# Patient Record
Sex: Female | Born: 1965 | Race: White | Hispanic: No | Marital: Single | Smoking: Former smoker
Health system: Southern US, Community
[De-identification: ages and names within clinical notes are randomized; demographics above are authoritative.]

## PROBLEM LIST (undated history)

## (undated) ENCOUNTER — Ambulatory Visit (HOSPITAL_COMMUNITY): Admission: EM | Discharge status: Absent without leave | Payer: Medicaid Other

## (undated) DIAGNOSIS — N189 Chronic kidney disease, unspecified: Secondary | ICD-10-CM

## (undated) DIAGNOSIS — R0902 Hypoxemia: Secondary | ICD-10-CM

## (undated) DIAGNOSIS — M109 Gout, unspecified: Secondary | ICD-10-CM

## (undated) DIAGNOSIS — A4902 Methicillin resistant Staphylococcus aureus infection, unspecified site: Secondary | ICD-10-CM

## (undated) DIAGNOSIS — Z5900 Homelessness unspecified: Secondary | ICD-10-CM

## (undated) DIAGNOSIS — J45909 Unspecified asthma, uncomplicated: Secondary | ICD-10-CM

## (undated) DIAGNOSIS — N289 Disorder of kidney and ureter, unspecified: Secondary | ICD-10-CM

## (undated) DIAGNOSIS — G4733 Obstructive sleep apnea (adult) (pediatric): Secondary | ICD-10-CM

## (undated) DIAGNOSIS — L732 Hidradenitis suppurativa: Secondary | ICD-10-CM

## (undated) DIAGNOSIS — K219 Gastro-esophageal reflux disease without esophagitis: Secondary | ICD-10-CM

## (undated) DIAGNOSIS — E785 Hyperlipidemia, unspecified: Secondary | ICD-10-CM

## (undated) DIAGNOSIS — G4730 Sleep apnea, unspecified: Secondary | ICD-10-CM

## (undated) DIAGNOSIS — F419 Anxiety disorder, unspecified: Secondary | ICD-10-CM

## (undated) DIAGNOSIS — T7840XA Allergy, unspecified, initial encounter: Secondary | ICD-10-CM

## (undated) DIAGNOSIS — G8929 Other chronic pain: Secondary | ICD-10-CM

## (undated) DIAGNOSIS — R109 Unspecified abdominal pain: Secondary | ICD-10-CM

## (undated) DIAGNOSIS — M545 Low back pain, unspecified: Secondary | ICD-10-CM

## (undated) DIAGNOSIS — I10 Essential (primary) hypertension: Secondary | ICD-10-CM

## (undated) DIAGNOSIS — I509 Heart failure, unspecified: Secondary | ICD-10-CM

## (undated) DIAGNOSIS — M1990 Unspecified osteoarthritis, unspecified site: Secondary | ICD-10-CM

## (undated) DIAGNOSIS — Z9981 Dependence on supplemental oxygen: Secondary | ICD-10-CM

## (undated) DIAGNOSIS — Q828 Other specified congenital malformations of skin: Secondary | ICD-10-CM

## (undated) HISTORY — PX: FRACTURE SURGERY: SHX138

## (undated) HISTORY — DX: Low back pain: M54.5

## (undated) HISTORY — DX: Essential (primary) hypertension: I10

## (undated) HISTORY — DX: Homelessness: Z59.0

## (undated) HISTORY — DX: Gastro-esophageal reflux disease without esophagitis: K21.9

## (undated) HISTORY — PX: TUBAL LIGATION: SHX77

## (undated) HISTORY — DX: Unspecified asthma, uncomplicated: J45.909

## (undated) HISTORY — DX: Low back pain, unspecified: M54.50

## (undated) HISTORY — DX: Allergy, unspecified, initial encounter: T78.40XA

## (undated) HISTORY — PX: BACK SURGERY: SHX140

## (undated) HISTORY — DX: Homelessness unspecified: Z59.00

## (undated) HISTORY — DX: Anxiety disorder, unspecified: F41.9

## (undated) HISTORY — DX: Sleep apnea, unspecified: G47.30

## (undated) HISTORY — DX: Hypoxemia: R09.02

## (undated) HISTORY — DX: Chronic kidney disease, unspecified: N18.9

## (undated) HISTORY — DX: Other specified congenital malformations of skin: Q82.8

---

## 2000-10-23 ENCOUNTER — Emergency Department (HOSPITAL_COMMUNITY): Admission: EM | Admit: 2000-10-23 | Discharge: 2000-10-23 | Payer: Self-pay | Admitting: Emergency Medicine

## 2000-10-25 ENCOUNTER — Emergency Department (HOSPITAL_COMMUNITY): Admission: EM | Admit: 2000-10-25 | Discharge: 2000-10-25 | Payer: Self-pay | Admitting: *Deleted

## 2000-11-22 ENCOUNTER — Emergency Department (HOSPITAL_COMMUNITY): Admission: EM | Admit: 2000-11-22 | Discharge: 2000-11-22 | Payer: Self-pay | Admitting: *Deleted

## 2001-03-25 ENCOUNTER — Emergency Department (HOSPITAL_COMMUNITY): Admission: EM | Admit: 2001-03-25 | Discharge: 2001-03-25 | Payer: Self-pay | Admitting: Emergency Medicine

## 2001-04-12 ENCOUNTER — Emergency Department (HOSPITAL_COMMUNITY): Admission: EM | Admit: 2001-04-12 | Discharge: 2001-04-13 | Payer: Self-pay | Admitting: Emergency Medicine

## 2001-04-21 ENCOUNTER — Emergency Department (HOSPITAL_COMMUNITY): Admission: EM | Admit: 2001-04-21 | Discharge: 2001-04-21 | Payer: Self-pay

## 2001-04-22 ENCOUNTER — Encounter: Payer: Self-pay | Admitting: Emergency Medicine

## 2001-06-12 ENCOUNTER — Emergency Department (HOSPITAL_COMMUNITY): Admission: EM | Admit: 2001-06-12 | Discharge: 2001-06-13 | Payer: Self-pay | Admitting: Emergency Medicine

## 2001-07-12 ENCOUNTER — Encounter: Payer: Self-pay | Admitting: *Deleted

## 2001-07-12 ENCOUNTER — Emergency Department (HOSPITAL_COMMUNITY): Admission: EM | Admit: 2001-07-12 | Discharge: 2001-07-12 | Payer: Self-pay | Admitting: *Deleted

## 2001-07-21 ENCOUNTER — Emergency Department (HOSPITAL_COMMUNITY): Admission: EM | Admit: 2001-07-21 | Discharge: 2001-07-21 | Payer: Self-pay | Admitting: Emergency Medicine

## 2001-08-31 ENCOUNTER — Emergency Department (HOSPITAL_COMMUNITY): Admission: EM | Admit: 2001-08-31 | Discharge: 2001-08-31 | Payer: Self-pay | Admitting: Emergency Medicine

## 2001-09-09 ENCOUNTER — Emergency Department (HOSPITAL_COMMUNITY): Admission: EM | Admit: 2001-09-09 | Discharge: 2001-09-09 | Payer: Self-pay | Admitting: Emergency Medicine

## 2001-09-09 ENCOUNTER — Encounter: Payer: Self-pay | Admitting: Emergency Medicine

## 2002-06-07 ENCOUNTER — Emergency Department (HOSPITAL_COMMUNITY): Admission: EM | Admit: 2002-06-07 | Discharge: 2002-06-07 | Payer: Self-pay | Admitting: Emergency Medicine

## 2002-06-08 ENCOUNTER — Encounter: Payer: Self-pay | Admitting: Emergency Medicine

## 2002-06-08 ENCOUNTER — Emergency Department (HOSPITAL_COMMUNITY): Admission: EM | Admit: 2002-06-08 | Discharge: 2002-06-08 | Payer: Self-pay | Admitting: Emergency Medicine

## 2002-08-17 ENCOUNTER — Emergency Department (HOSPITAL_COMMUNITY): Admission: EM | Admit: 2002-08-17 | Discharge: 2002-08-17 | Payer: Self-pay

## 2002-08-28 ENCOUNTER — Emergency Department (HOSPITAL_COMMUNITY): Admission: EM | Admit: 2002-08-28 | Discharge: 2002-08-28 | Payer: Self-pay | Admitting: Emergency Medicine

## 2002-08-28 ENCOUNTER — Encounter: Payer: Self-pay | Admitting: Emergency Medicine

## 2003-01-20 ENCOUNTER — Inpatient Hospital Stay (HOSPITAL_COMMUNITY): Admission: EM | Admit: 2003-01-20 | Discharge: 2003-01-24 | Payer: Self-pay | Admitting: Emergency Medicine

## 2003-01-31 ENCOUNTER — Emergency Department (HOSPITAL_COMMUNITY): Admission: EM | Admit: 2003-01-31 | Discharge: 2003-01-31 | Payer: Self-pay | Admitting: Emergency Medicine

## 2003-01-31 ENCOUNTER — Emergency Department (HOSPITAL_COMMUNITY): Admission: EM | Admit: 2003-01-31 | Discharge: 2003-02-01 | Payer: Self-pay | Admitting: Emergency Medicine

## 2003-02-11 ENCOUNTER — Emergency Department (HOSPITAL_COMMUNITY): Admission: EM | Admit: 2003-02-11 | Discharge: 2003-02-11 | Payer: Self-pay | Admitting: Family Medicine

## 2003-02-11 ENCOUNTER — Emergency Department (HOSPITAL_COMMUNITY): Admission: EM | Admit: 2003-02-11 | Discharge: 2003-02-12 | Payer: Self-pay | Admitting: Emergency Medicine

## 2003-02-26 ENCOUNTER — Emergency Department (HOSPITAL_COMMUNITY): Admission: EM | Admit: 2003-02-26 | Discharge: 2003-02-26 | Payer: Self-pay | Admitting: Emergency Medicine

## 2003-03-29 ENCOUNTER — Emergency Department (HOSPITAL_COMMUNITY): Admission: EM | Admit: 2003-03-29 | Discharge: 2003-03-29 | Payer: Self-pay | Admitting: Emergency Medicine

## 2003-04-06 ENCOUNTER — Emergency Department (HOSPITAL_COMMUNITY): Admission: EM | Admit: 2003-04-06 | Discharge: 2003-04-06 | Payer: Self-pay | Admitting: Emergency Medicine

## 2003-06-03 ENCOUNTER — Emergency Department (HOSPITAL_COMMUNITY): Admission: EM | Admit: 2003-06-03 | Discharge: 2003-06-03 | Payer: Self-pay | Admitting: Emergency Medicine

## 2003-06-09 ENCOUNTER — Emergency Department (HOSPITAL_COMMUNITY): Admission: EM | Admit: 2003-06-09 | Discharge: 2003-06-10 | Payer: Self-pay | Admitting: Emergency Medicine

## 2003-06-11 ENCOUNTER — Emergency Department (HOSPITAL_COMMUNITY): Admission: EM | Admit: 2003-06-11 | Discharge: 2003-06-11 | Payer: Self-pay | Admitting: *Deleted

## 2003-06-16 ENCOUNTER — Emergency Department (HOSPITAL_COMMUNITY): Admission: EM | Admit: 2003-06-16 | Discharge: 2003-06-16 | Payer: Self-pay | Admitting: Emergency Medicine

## 2003-06-24 ENCOUNTER — Emergency Department (HOSPITAL_COMMUNITY): Admission: EM | Admit: 2003-06-24 | Discharge: 2003-06-24 | Payer: Self-pay | Admitting: Emergency Medicine

## 2003-07-14 ENCOUNTER — Emergency Department (HOSPITAL_COMMUNITY): Admission: EM | Admit: 2003-07-14 | Discharge: 2003-07-14 | Payer: Self-pay | Admitting: Emergency Medicine

## 2003-08-03 ENCOUNTER — Emergency Department (HOSPITAL_COMMUNITY): Admission: EM | Admit: 2003-08-03 | Discharge: 2003-08-04 | Payer: Self-pay | Admitting: Emergency Medicine

## 2003-08-05 ENCOUNTER — Emergency Department (HOSPITAL_COMMUNITY): Admission: EM | Admit: 2003-08-05 | Discharge: 2003-08-06 | Payer: Self-pay | Admitting: Emergency Medicine

## 2003-08-30 ENCOUNTER — Emergency Department (HOSPITAL_COMMUNITY): Admission: EM | Admit: 2003-08-30 | Discharge: 2003-08-30 | Payer: Self-pay | Admitting: Family Medicine

## 2003-09-11 ENCOUNTER — Emergency Department (HOSPITAL_COMMUNITY): Admission: EM | Admit: 2003-09-11 | Discharge: 2003-09-11 | Payer: Self-pay | Admitting: Emergency Medicine

## 2003-09-17 ENCOUNTER — Emergency Department (HOSPITAL_COMMUNITY): Admission: EM | Admit: 2003-09-17 | Discharge: 2003-09-17 | Payer: Self-pay | Admitting: Emergency Medicine

## 2003-12-18 ENCOUNTER — Emergency Department (HOSPITAL_COMMUNITY): Admission: EM | Admit: 2003-12-18 | Discharge: 2003-12-18 | Payer: Self-pay | Admitting: Family Medicine

## 2004-04-09 ENCOUNTER — Ambulatory Visit (HOSPITAL_COMMUNITY): Admission: RE | Admit: 2004-04-09 | Discharge: 2004-04-09 | Payer: Self-pay | Admitting: Internal Medicine

## 2004-04-09 ENCOUNTER — Ambulatory Visit: Payer: Self-pay | Admitting: Internal Medicine

## 2004-05-01 ENCOUNTER — Emergency Department (HOSPITAL_COMMUNITY): Admission: EM | Admit: 2004-05-01 | Discharge: 2004-05-01 | Payer: Self-pay | Admitting: Emergency Medicine

## 2004-08-05 ENCOUNTER — Ambulatory Visit (HOSPITAL_COMMUNITY): Admission: RE | Admit: 2004-08-05 | Discharge: 2004-08-05 | Payer: Self-pay | Admitting: Internal Medicine

## 2004-08-05 ENCOUNTER — Ambulatory Visit: Payer: Self-pay | Admitting: Internal Medicine

## 2004-11-29 ENCOUNTER — Emergency Department (HOSPITAL_COMMUNITY): Admission: EM | Admit: 2004-11-29 | Discharge: 2004-11-29 | Payer: Self-pay | Admitting: Emergency Medicine

## 2004-12-03 ENCOUNTER — Emergency Department (HOSPITAL_COMMUNITY): Admission: EM | Admit: 2004-12-03 | Discharge: 2004-12-04 | Payer: Self-pay | Admitting: Emergency Medicine

## 2005-01-03 ENCOUNTER — Emergency Department (HOSPITAL_COMMUNITY): Admission: EM | Admit: 2005-01-03 | Discharge: 2005-01-03 | Payer: Self-pay | Admitting: Emergency Medicine

## 2005-03-12 ENCOUNTER — Emergency Department (HOSPITAL_COMMUNITY): Admission: EM | Admit: 2005-03-12 | Discharge: 2005-03-13 | Payer: Self-pay | Admitting: Emergency Medicine

## 2005-05-23 ENCOUNTER — Emergency Department (HOSPITAL_COMMUNITY): Admission: EM | Admit: 2005-05-23 | Discharge: 2005-05-24 | Payer: Self-pay | Admitting: Emergency Medicine

## 2005-06-03 ENCOUNTER — Emergency Department (HOSPITAL_COMMUNITY): Admission: EM | Admit: 2005-06-03 | Discharge: 2005-06-03 | Payer: Self-pay | Admitting: Emergency Medicine

## 2005-07-09 ENCOUNTER — Ambulatory Visit: Payer: Self-pay | Admitting: Internal Medicine

## 2005-07-24 ENCOUNTER — Ambulatory Visit: Payer: Self-pay | Admitting: Internal Medicine

## 2005-08-31 IMAGING — CT CT HEAD W/O CM
1 of 2 series · 13 of 30 positions shown, 17 images · non-contrast
Comparison: none

CLINICAL DATA: Severe headache.

 CT HEAD WITHOUT CONTRAST ? 02/12/03
TECHNIQUE: Multiple contiguous axial images were obtained from the skull base to the vertex.

[Series 3: — · axial · 0.46mm/px · z∈[-186,-56]mm · 13 of 32 slices shown, 17 images]
[im 3/32  brain]
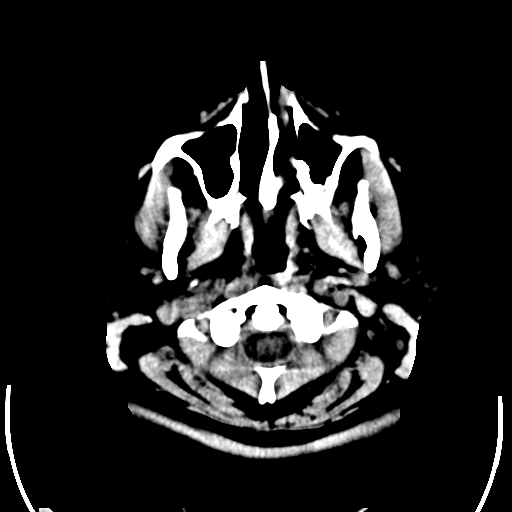
[im 3/32  bone]
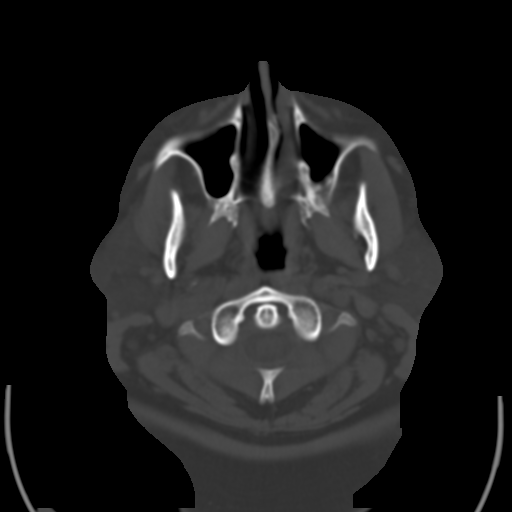
[im 5/32  brain]
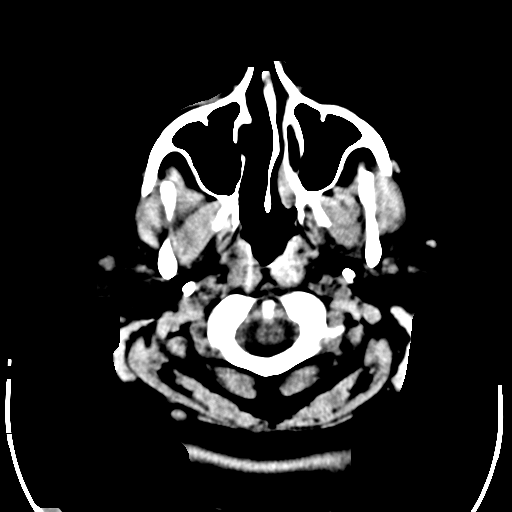
[im 7/32  brain]
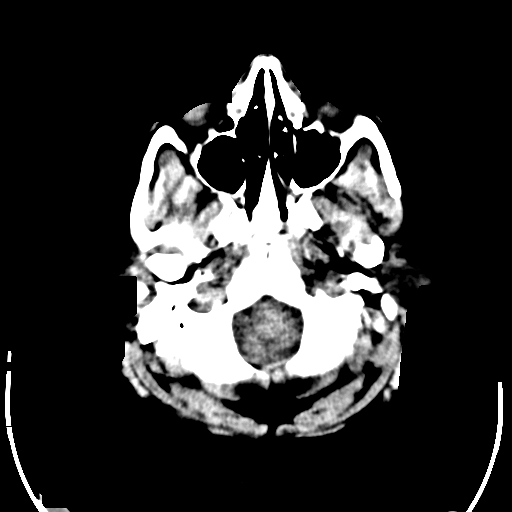
[im 9/32  brain]
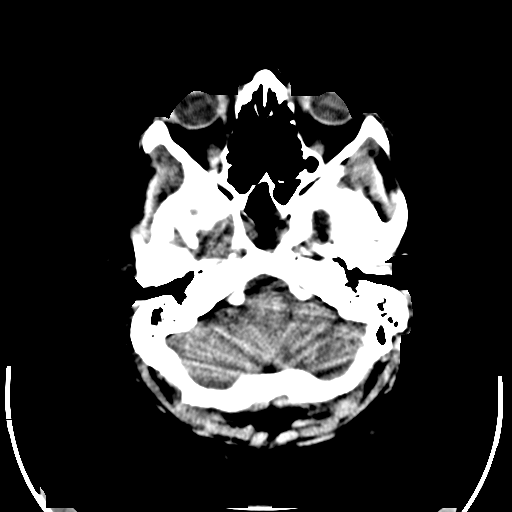
[im 12/32  brain]
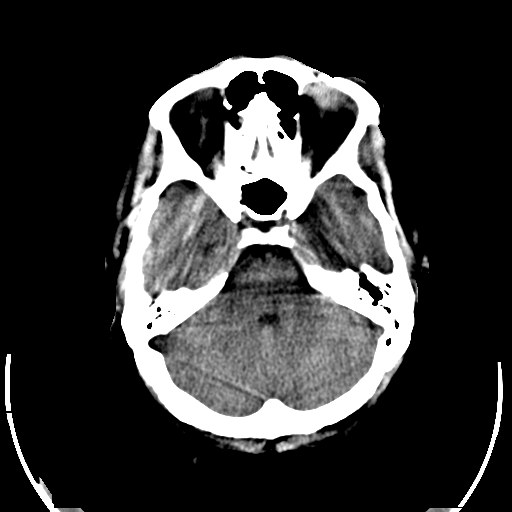
[im 12/32  bone]
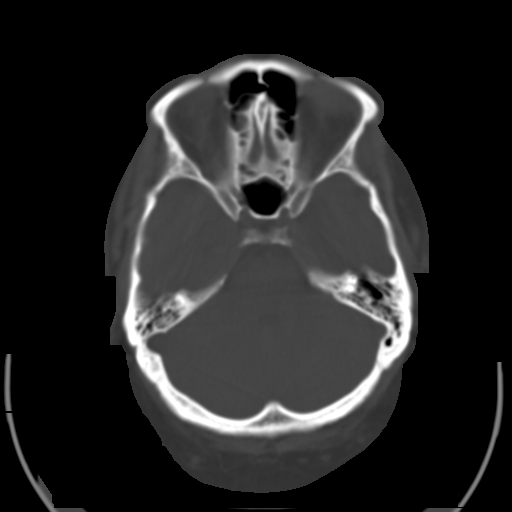
[im 14/32  brain]
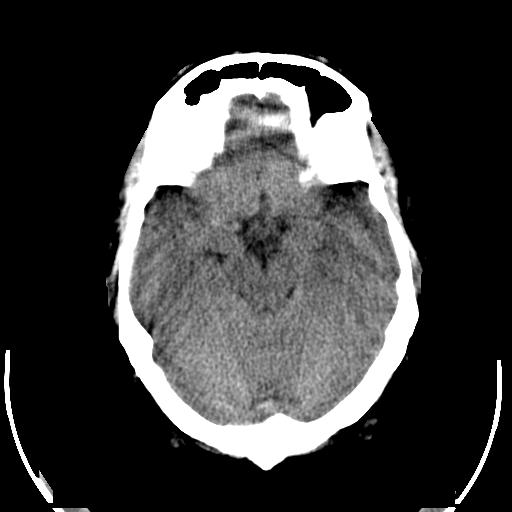
[im 16/32  brain]
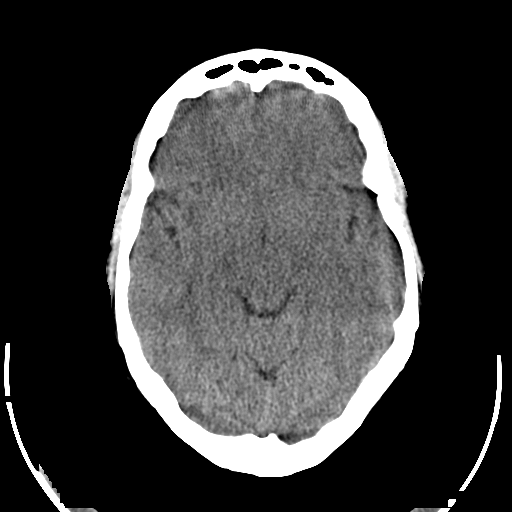
[im 18/32  brain]
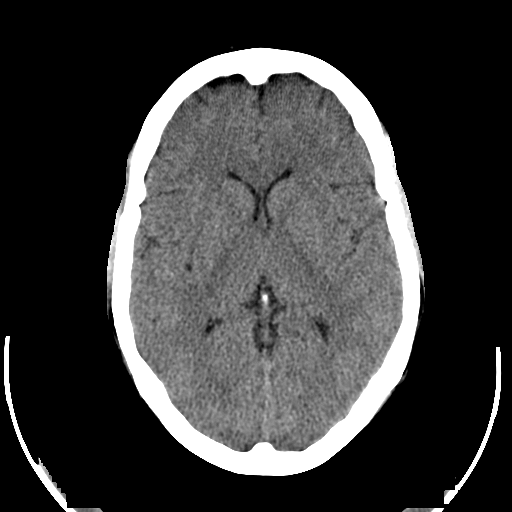
[im 20/32  brain]
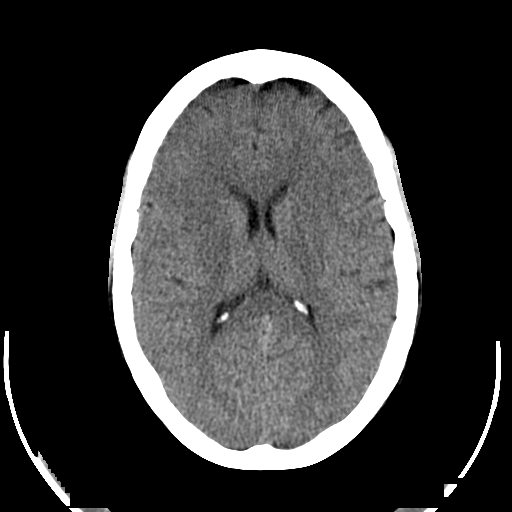
[im 20/32  bone]
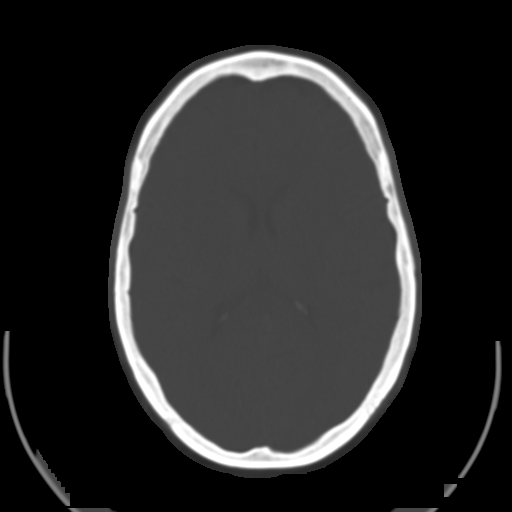
[im 23/32  brain]
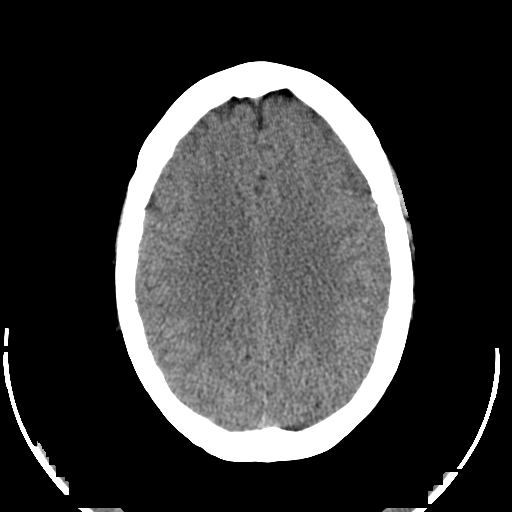
[im 25/32  brain]
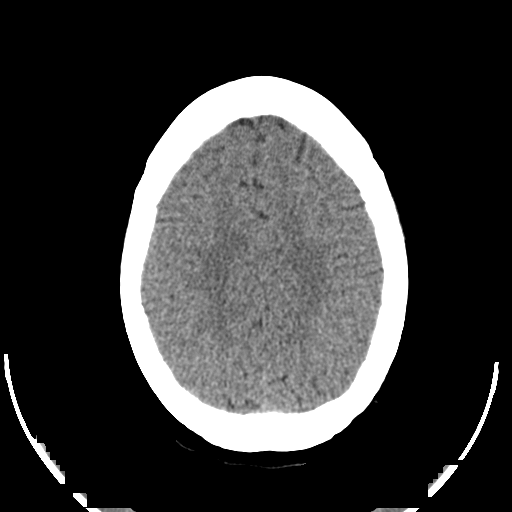
[im 27/32  brain]
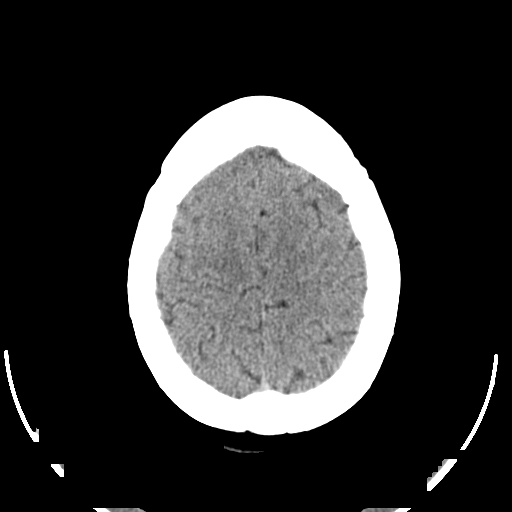
[im 29/32  brain]
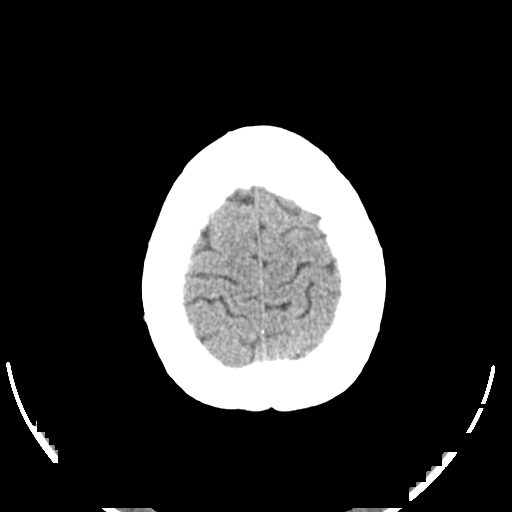
[im 29/32  bone]
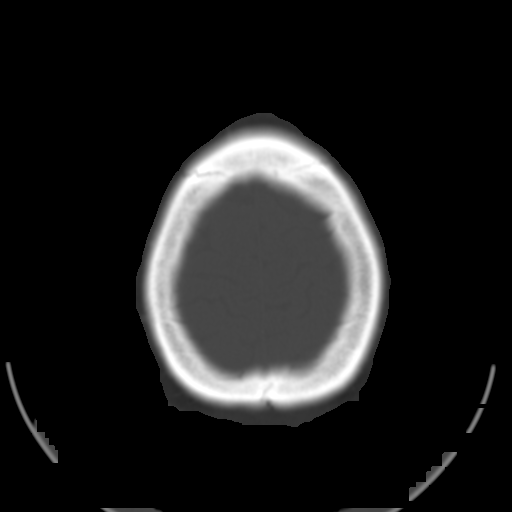

[13 of 30 positions shown; findings below may reference images not displayed]

FINDINGS: No evidence of acute intracranial abnormality including mass or mass effect, hydrocephalus, extra-axial fluid collection, midline shift, hemorrhage, or infarct.   Acute infarct may be missed by CT for 24-48 hours.   The visualized bony calvarium and paranasal sinuses are unremarkable except for mucosal thickening in the sphenoid sinus.

 IMPRESSION
 1.  No evidence of acute intracranial abnormality.  
 2.  Chronic sphenoid sinusitis.

## 2005-09-11 ENCOUNTER — Emergency Department (HOSPITAL_COMMUNITY): Admission: EM | Admit: 2005-09-11 | Discharge: 2005-09-12 | Payer: Self-pay | Admitting: Emergency Medicine

## 2005-12-15 ENCOUNTER — Emergency Department (HOSPITAL_COMMUNITY): Admission: EM | Admit: 2005-12-15 | Discharge: 2005-12-15 | Payer: Self-pay | Admitting: Emergency Medicine

## 2005-12-22 ENCOUNTER — Emergency Department (HOSPITAL_COMMUNITY): Admission: EM | Admit: 2005-12-22 | Discharge: 2005-12-22 | Payer: Self-pay | Admitting: Emergency Medicine

## 2005-12-30 ENCOUNTER — Emergency Department (HOSPITAL_COMMUNITY): Admission: EM | Admit: 2005-12-30 | Discharge: 2005-12-30 | Payer: Self-pay | Admitting: Emergency Medicine

## 2005-12-31 ENCOUNTER — Emergency Department (HOSPITAL_COMMUNITY): Admission: EM | Admit: 2005-12-31 | Discharge: 2005-12-31 | Payer: Self-pay | Admitting: Emergency Medicine

## 2006-01-02 IMAGING — CR DG CHEST 2V
2 series · 2 of 2 positions shown · non-contrast
Comparison: 04/22/01.

CLINICAL DATA: Chest pain and cough.  Lower extremity swelling.
 CHEST, TWO VIEWS

[view not recorded (1 of 2)]
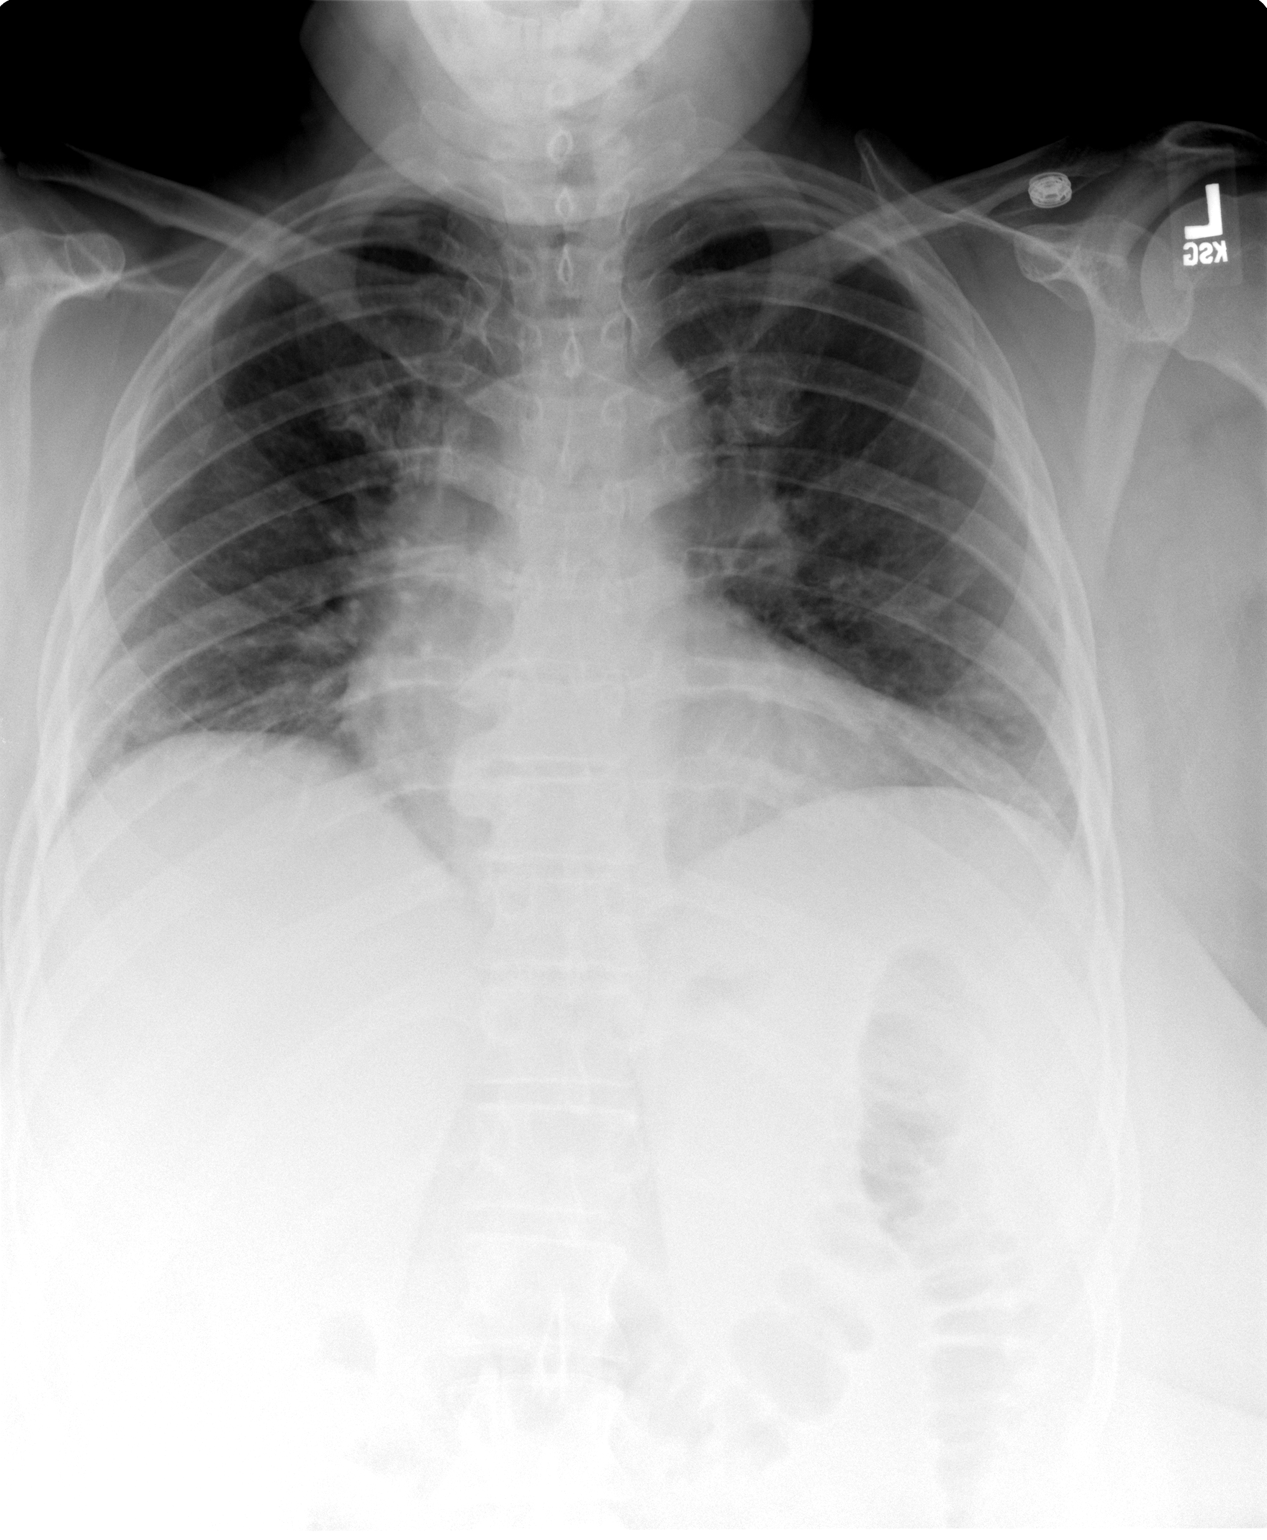

[view not recorded (2 of 2)]
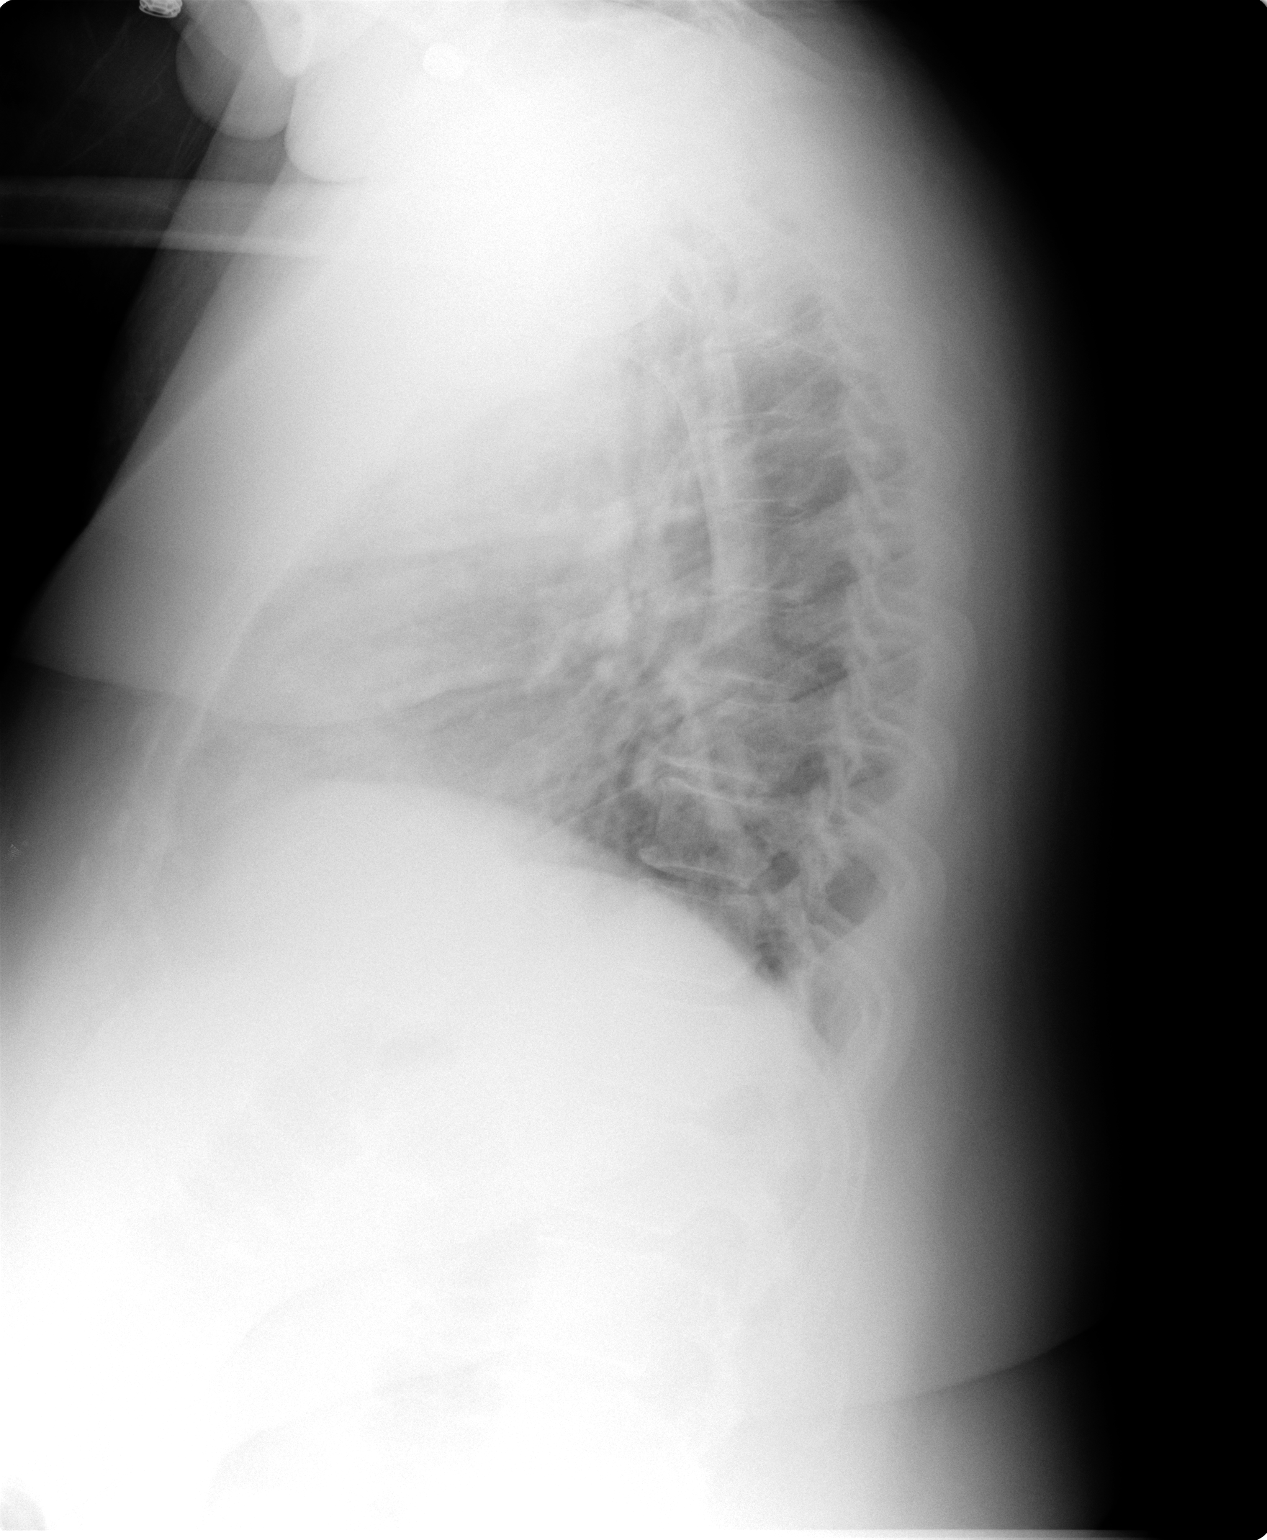

[2 of 2 positions shown; findings below may reference images not displayed]

Low inspiratory lung volumes are again seen.  Pulmonary interstitial prominence is unchanged.  There is no evidence of acute infiltrate or pleural effusion.  Heart size is prominent, and this may be due to low lung volumes; however, this is also unchanged compared to the prior study. 
 IMPRESSION
 Low inspiratory lung volumes.  No acute disease.

## 2006-01-07 ENCOUNTER — Emergency Department (HOSPITAL_COMMUNITY): Admission: EM | Admit: 2006-01-07 | Discharge: 2006-01-07 | Payer: Self-pay | Admitting: Emergency Medicine

## 2006-01-20 ENCOUNTER — Emergency Department (HOSPITAL_COMMUNITY): Admission: EM | Admit: 2006-01-20 | Discharge: 2006-01-21 | Payer: Self-pay | Admitting: Emergency Medicine

## 2006-01-21 ENCOUNTER — Encounter (INDEPENDENT_AMBULATORY_CARE_PROVIDER_SITE_OTHER): Payer: Self-pay | Admitting: Specialist

## 2006-01-21 ENCOUNTER — Ambulatory Visit: Payer: Self-pay | Admitting: Internal Medicine

## 2006-01-22 ENCOUNTER — Other Ambulatory Visit: Admission: RE | Admit: 2006-01-22 | Discharge: 2006-01-22 | Payer: Self-pay | Admitting: Unknown Physician Specialty

## 2006-01-23 ENCOUNTER — Emergency Department (HOSPITAL_COMMUNITY): Admission: EM | Admit: 2006-01-23 | Discharge: 2006-01-23 | Payer: Self-pay | Admitting: Emergency Medicine

## 2006-01-24 ENCOUNTER — Emergency Department (HOSPITAL_COMMUNITY): Admission: EM | Admit: 2006-01-24 | Discharge: 2006-01-24 | Payer: Self-pay | Admitting: Emergency Medicine

## 2006-01-30 ENCOUNTER — Ambulatory Visit: Payer: Self-pay | Admitting: Internal Medicine

## 2006-02-09 DIAGNOSIS — I10 Essential (primary) hypertension: Secondary | ICD-10-CM | POA: Insufficient documentation

## 2006-02-09 DIAGNOSIS — Z9189 Other specified personal risk factors, not elsewhere classified: Secondary | ICD-10-CM | POA: Insufficient documentation

## 2006-02-09 DIAGNOSIS — J45909 Unspecified asthma, uncomplicated: Secondary | ICD-10-CM | POA: Insufficient documentation

## 2006-02-16 ENCOUNTER — Emergency Department (HOSPITAL_COMMUNITY): Admission: EM | Admit: 2006-02-16 | Discharge: 2006-02-17 | Payer: Self-pay | Admitting: Emergency Medicine

## 2006-02-17 ENCOUNTER — Encounter: Payer: Self-pay | Admitting: Vascular Surgery

## 2006-02-22 IMAGING — CR DG CHEST 2V
2 series · 2 of 2 positions shown · non-contrast
Comparison: none

CLINICAL DATA: Chest pain.  Congestion.  Productive cough.  Fever.  Asthma.
 TWO VIEWS OF THE CHEST
 Comparison, 06/24/2003.
 Low lung volumes.  Borderline cardiac enlargement.  Mediastinal contours and vascularity are normal.  Bibasilar atelectasis, similar to previous exam.  No definite acute consolidation or effusion.  Bones unremarkable.
 IMPRESSION
 Low lung volumes with bibasilar atelectasis.  No interval change.

[view not recorded (1 of 2)]
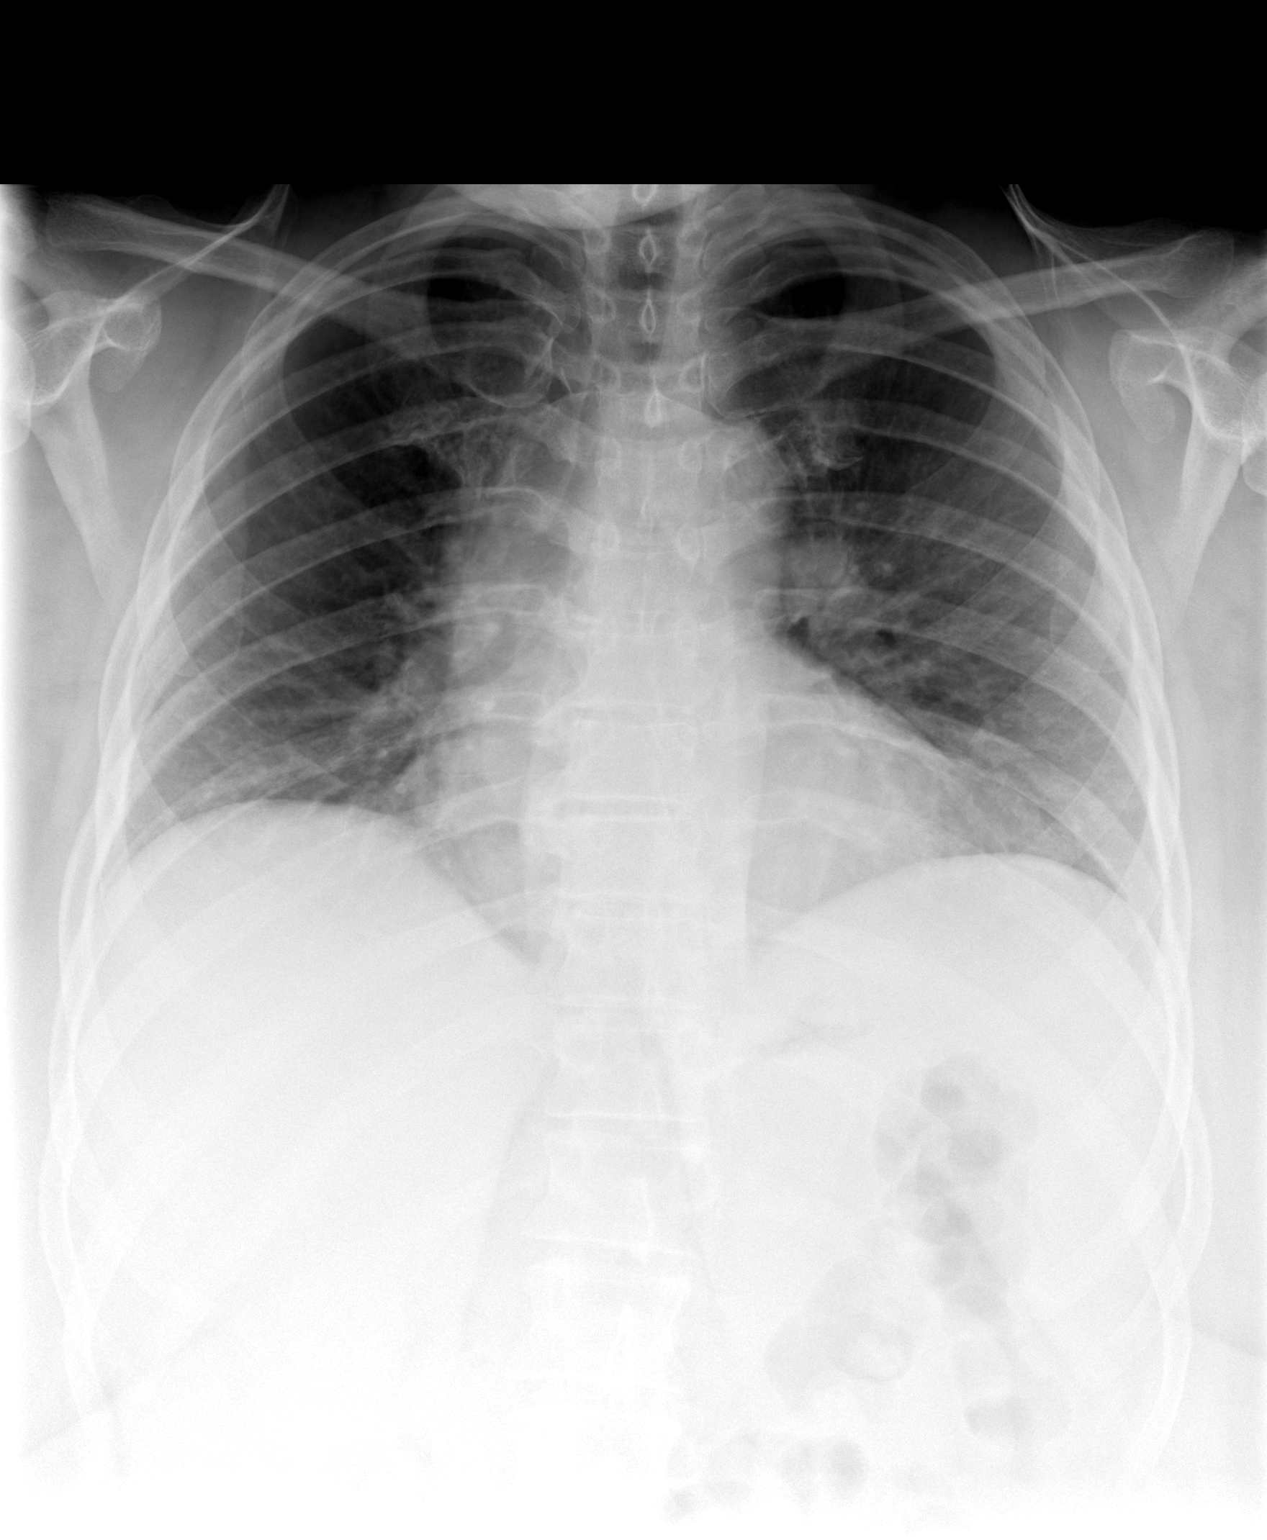

[view not recorded (2 of 2)]
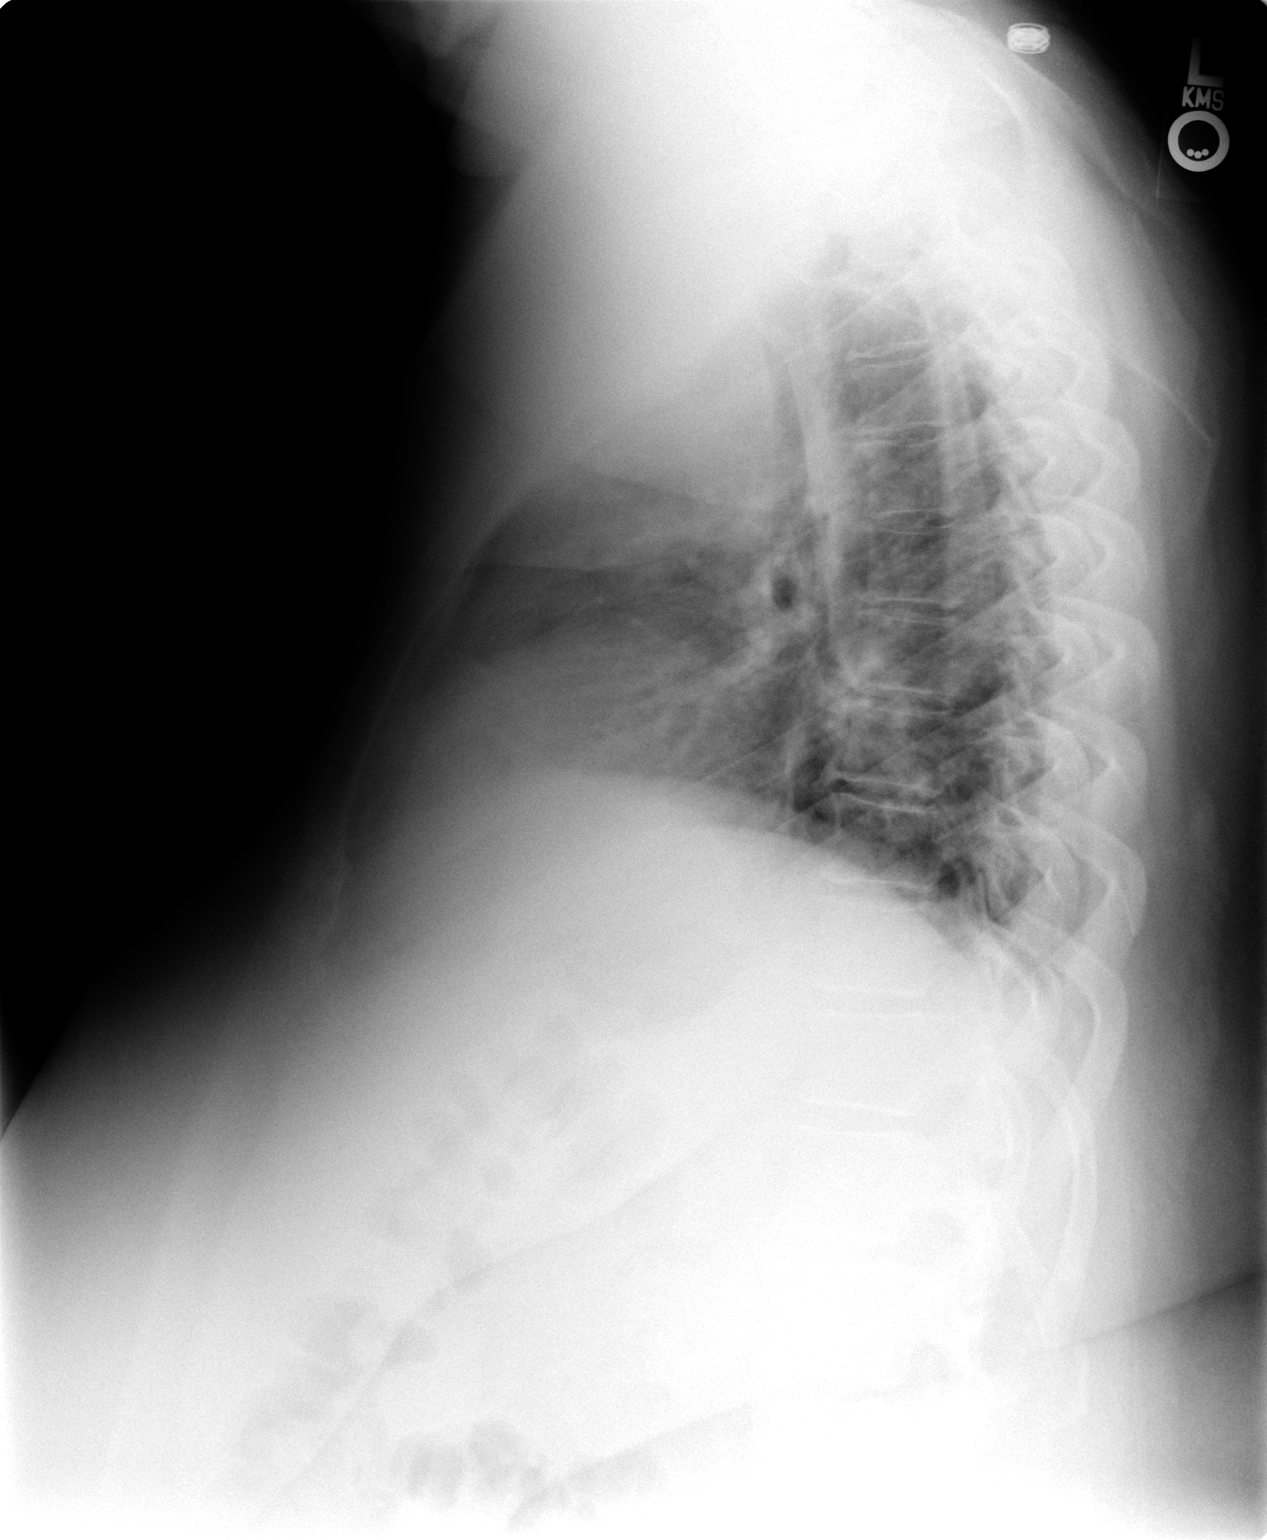

[2 of 2 positions shown; findings below may reference images not displayed]

## 2006-02-24 ENCOUNTER — Emergency Department (HOSPITAL_COMMUNITY): Admission: EM | Admit: 2006-02-24 | Discharge: 2006-02-24 | Payer: Self-pay | Admitting: Emergency Medicine

## 2006-02-26 ENCOUNTER — Emergency Department (HOSPITAL_COMMUNITY): Admission: EM | Admit: 2006-02-26 | Discharge: 2006-02-26 | Payer: Self-pay | Admitting: Emergency Medicine

## 2006-02-27 ENCOUNTER — Ambulatory Visit: Payer: Self-pay | Admitting: Internal Medicine

## 2006-03-03 ENCOUNTER — Emergency Department (HOSPITAL_COMMUNITY): Admission: EM | Admit: 2006-03-03 | Discharge: 2006-03-04 | Payer: Self-pay | Admitting: Emergency Medicine

## 2006-03-05 ENCOUNTER — Emergency Department (HOSPITAL_COMMUNITY): Admission: EM | Admit: 2006-03-05 | Discharge: 2006-03-06 | Payer: Self-pay | Admitting: Emergency Medicine

## 2006-03-20 ENCOUNTER — Emergency Department (HOSPITAL_COMMUNITY): Admission: EM | Admit: 2006-03-20 | Discharge: 2006-03-20 | Payer: Self-pay | Admitting: Emergency Medicine

## 2006-03-30 ENCOUNTER — Emergency Department (HOSPITAL_COMMUNITY): Admission: EM | Admit: 2006-03-30 | Discharge: 2006-03-30 | Payer: Self-pay | Admitting: Emergency Medicine

## 2006-04-15 ENCOUNTER — Encounter: Payer: Self-pay | Admitting: Licensed Clinical Social Worker

## 2006-04-21 ENCOUNTER — Encounter (INDEPENDENT_AMBULATORY_CARE_PROVIDER_SITE_OTHER): Payer: Self-pay | Admitting: Internal Medicine

## 2006-04-21 ENCOUNTER — Ambulatory Visit: Payer: Self-pay | Admitting: Internal Medicine

## 2006-04-21 DIAGNOSIS — L259 Unspecified contact dermatitis, unspecified cause: Secondary | ICD-10-CM

## 2006-04-21 LAB — CONVERTED CEMR LAB
BUN: 20 mg/dL (ref 6–23)
Basophils Relative: 0 % (ref 0–1)
CO2: 30 meq/L (ref 19–32)
Chloride: 100 meq/L (ref 96–112)
Hemoglobin: 12.2 g/dL (ref 12.0–15.0)
Lymphocytes Relative: 18 % (ref 12–46)
MCHC: 33 g/dL (ref 30.0–36.0)
Monocytes Absolute: 0.5 10*3/uL (ref 0.2–0.7)
Monocytes Relative: 6 % (ref 3–11)
Neutro Abs: 6.2 10*3/uL (ref 1.7–7.7)
Neutrophils Relative %: 72 % (ref 43–77)
Potassium: 4 meq/L (ref 3.5–5.3)
RBC: 4.24 M/uL (ref 3.87–5.11)
WBC: 8.7 10*3/uL (ref 4.0–10.5)

## 2006-04-27 ENCOUNTER — Emergency Department (HOSPITAL_COMMUNITY): Admission: EM | Admit: 2006-04-27 | Discharge: 2006-04-28 | Payer: Self-pay | Admitting: Emergency Medicine

## 2006-04-28 ENCOUNTER — Emergency Department (HOSPITAL_COMMUNITY): Admission: EM | Admit: 2006-04-28 | Discharge: 2006-04-28 | Payer: Self-pay | Admitting: *Deleted

## 2006-04-29 ENCOUNTER — Emergency Department (HOSPITAL_COMMUNITY): Admission: EM | Admit: 2006-04-29 | Discharge: 2006-04-30 | Payer: Self-pay | Admitting: Emergency Medicine

## 2006-05-02 ENCOUNTER — Emergency Department (HOSPITAL_COMMUNITY): Admission: EM | Admit: 2006-05-02 | Discharge: 2006-05-02 | Payer: Self-pay | Admitting: Emergency Medicine

## 2006-05-04 ENCOUNTER — Ambulatory Visit: Payer: Self-pay | Admitting: Internal Medicine

## 2006-05-04 ENCOUNTER — Inpatient Hospital Stay (HOSPITAL_COMMUNITY): Admission: EM | Admit: 2006-05-04 | Discharge: 2006-05-08 | Payer: Self-pay | Admitting: Emergency Medicine

## 2006-06-04 ENCOUNTER — Emergency Department (HOSPITAL_COMMUNITY): Admission: EM | Admit: 2006-06-04 | Discharge: 2006-06-04 | Payer: Self-pay | Admitting: Emergency Medicine

## 2006-06-07 ENCOUNTER — Emergency Department (HOSPITAL_COMMUNITY): Admission: EM | Admit: 2006-06-07 | Discharge: 2006-06-07 | Payer: Self-pay | Admitting: Emergency Medicine

## 2006-06-13 ENCOUNTER — Emergency Department (HOSPITAL_COMMUNITY): Admission: EM | Admit: 2006-06-13 | Discharge: 2006-06-14 | Payer: Self-pay | Admitting: Emergency Medicine

## 2006-06-14 ENCOUNTER — Emergency Department (HOSPITAL_COMMUNITY): Admission: EM | Admit: 2006-06-14 | Discharge: 2006-06-15 | Payer: Self-pay | Admitting: Emergency Medicine

## 2006-06-23 ENCOUNTER — Emergency Department (HOSPITAL_COMMUNITY): Admission: EM | Admit: 2006-06-23 | Discharge: 2006-06-23 | Payer: Self-pay | Admitting: Emergency Medicine

## 2006-07-06 ENCOUNTER — Ambulatory Visit: Payer: Self-pay | Admitting: Hospitalist

## 2006-07-09 ENCOUNTER — Emergency Department (HOSPITAL_COMMUNITY): Admission: EM | Admit: 2006-07-09 | Discharge: 2006-07-10 | Payer: Self-pay | Admitting: Emergency Medicine

## 2006-07-24 ENCOUNTER — Ambulatory Visit: Payer: Self-pay | Admitting: Internal Medicine

## 2006-07-24 ENCOUNTER — Ambulatory Visit (HOSPITAL_COMMUNITY): Admission: RE | Admit: 2006-07-24 | Discharge: 2006-07-24 | Payer: Self-pay | Admitting: *Deleted

## 2006-07-31 ENCOUNTER — Emergency Department (HOSPITAL_COMMUNITY): Admission: EM | Admit: 2006-07-31 | Discharge: 2006-07-31 | Payer: Self-pay | Admitting: Emergency Medicine

## 2006-08-10 ENCOUNTER — Emergency Department (HOSPITAL_COMMUNITY): Admission: EM | Admit: 2006-08-10 | Discharge: 2006-08-10 | Payer: Self-pay | Admitting: Emergency Medicine

## 2006-08-14 ENCOUNTER — Emergency Department (HOSPITAL_COMMUNITY): Admission: EM | Admit: 2006-08-14 | Discharge: 2006-08-14 | Payer: Self-pay | Admitting: Emergency Medicine

## 2006-08-21 ENCOUNTER — Emergency Department (HOSPITAL_COMMUNITY): Admission: EM | Admit: 2006-08-21 | Discharge: 2006-08-22 | Payer: Self-pay | Admitting: Emergency Medicine

## 2006-08-28 ENCOUNTER — Ambulatory Visit: Payer: Self-pay | Admitting: Internal Medicine

## 2006-09-13 ENCOUNTER — Emergency Department (HOSPITAL_COMMUNITY): Admission: EM | Admit: 2006-09-13 | Discharge: 2006-09-13 | Payer: Self-pay | Admitting: Emergency Medicine

## 2006-09-13 ENCOUNTER — Encounter (INDEPENDENT_AMBULATORY_CARE_PROVIDER_SITE_OTHER): Payer: Self-pay | Admitting: Emergency Medicine

## 2006-09-13 ENCOUNTER — Ambulatory Visit: Payer: Self-pay | Admitting: Vascular Surgery

## 2006-09-14 ENCOUNTER — Telehealth: Payer: Self-pay | Admitting: *Deleted

## 2006-09-14 ENCOUNTER — Emergency Department (HOSPITAL_COMMUNITY): Admission: EM | Admit: 2006-09-14 | Discharge: 2006-09-14 | Payer: Self-pay | Admitting: Emergency Medicine

## 2006-09-15 ENCOUNTER — Ambulatory Visit: Payer: Self-pay | Admitting: Internal Medicine

## 2006-09-15 ENCOUNTER — Emergency Department (HOSPITAL_COMMUNITY): Admission: EM | Admit: 2006-09-15 | Discharge: 2006-09-15 | Payer: Self-pay | Admitting: Emergency Medicine

## 2006-09-15 ENCOUNTER — Encounter (INDEPENDENT_AMBULATORY_CARE_PROVIDER_SITE_OTHER): Payer: Self-pay | Admitting: *Deleted

## 2006-09-19 ENCOUNTER — Emergency Department (HOSPITAL_COMMUNITY): Admission: EM | Admit: 2006-09-19 | Discharge: 2006-09-19 | Payer: Self-pay | Admitting: Emergency Medicine

## 2006-10-02 ENCOUNTER — Ambulatory Visit: Payer: Self-pay | Admitting: Internal Medicine

## 2006-10-02 ENCOUNTER — Encounter (INDEPENDENT_AMBULATORY_CARE_PROVIDER_SITE_OTHER): Payer: Self-pay | Admitting: *Deleted

## 2006-10-09 ENCOUNTER — Emergency Department (HOSPITAL_COMMUNITY): Admission: EM | Admit: 2006-10-09 | Discharge: 2006-10-09 | Payer: Self-pay | Admitting: Emergency Medicine

## 2006-10-23 ENCOUNTER — Emergency Department (HOSPITAL_COMMUNITY): Admission: EM | Admit: 2006-10-23 | Discharge: 2006-10-23 | Payer: Self-pay | Admitting: Emergency Medicine

## 2006-10-27 IMAGING — CR DG CHEST 2V
2 series · 2 of 2 positions shown · non-contrast
Comparison: 08/06/03.

CLINICAL DATA: Shortness of breath.  Chest pain.

[view not recorded (1 of 2)]
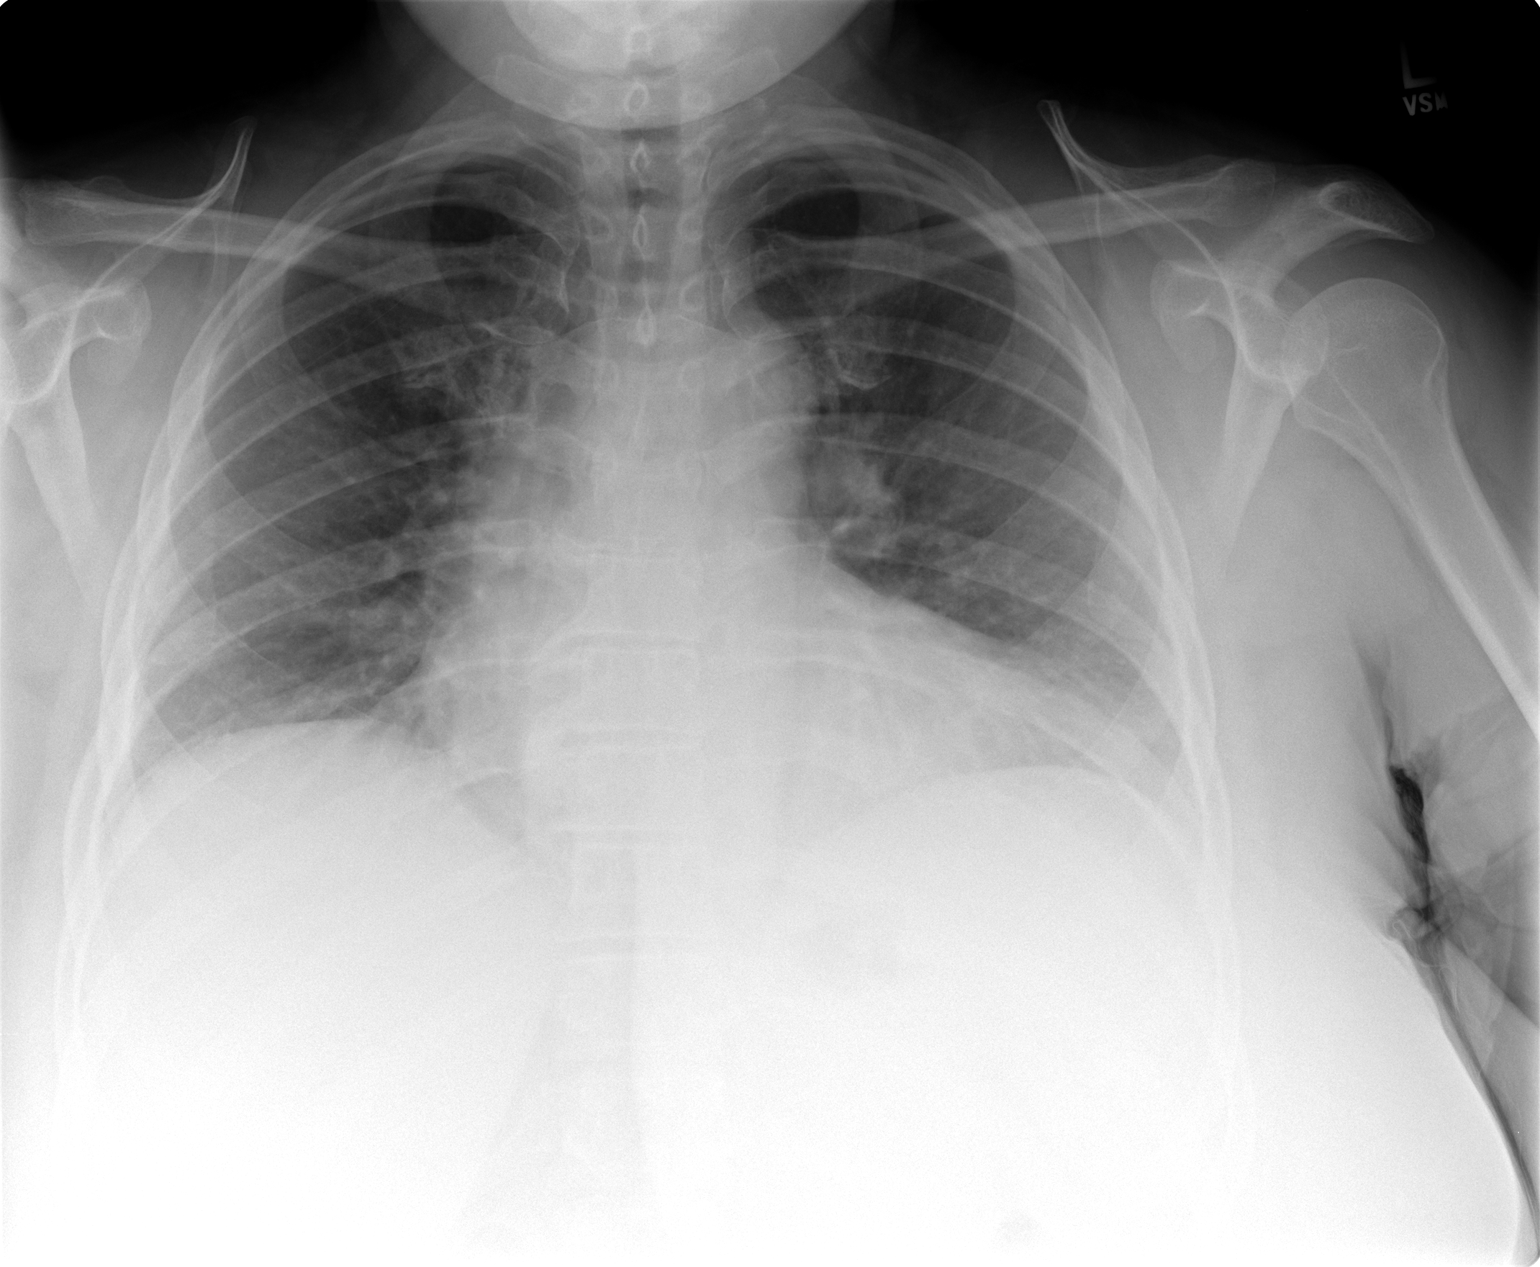

[view not recorded (2 of 2)]
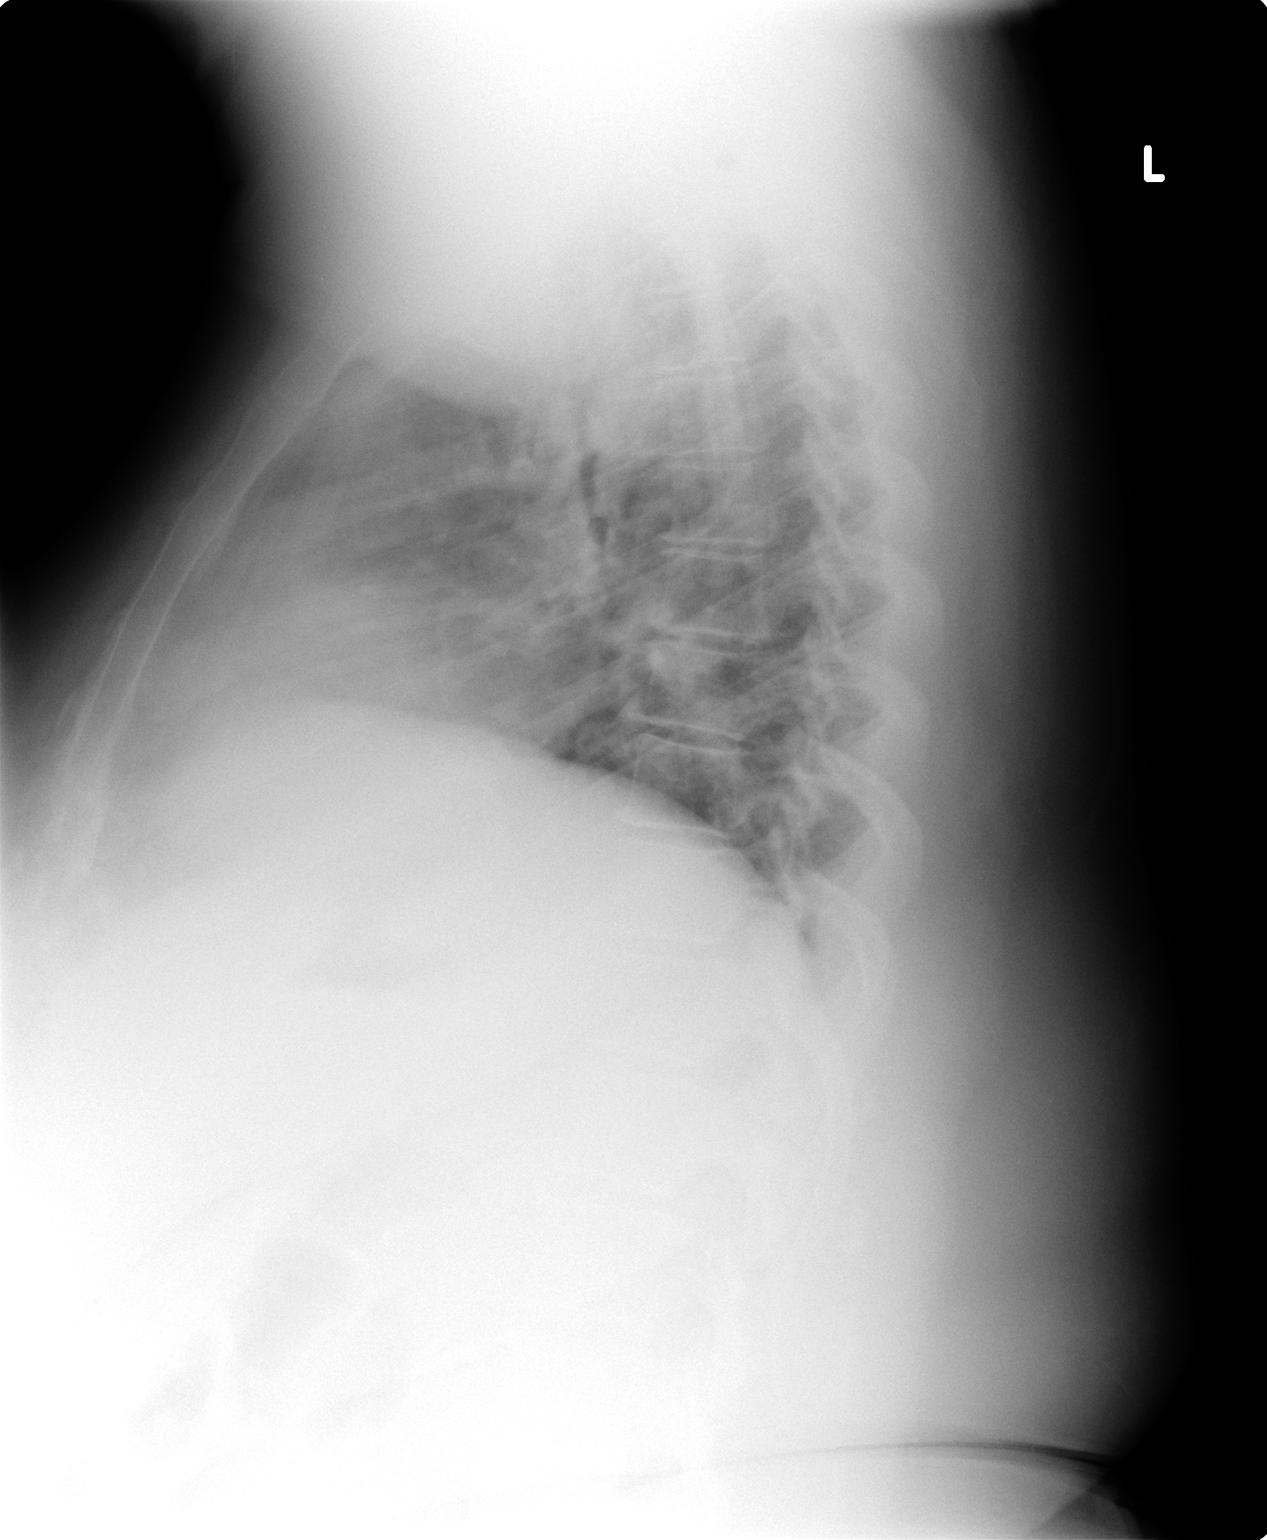

[2 of 2 positions shown; findings below may reference images not displayed]

PA AND LATERAL CHEST:
 Lung volumes are lower than on the prior study.  However, the lungs are clear.  No pleural effusion.  Heart size is normal.  No focal bony abnormality.
IMPRESSION: Low lung volumes without acute disease.

## 2006-10-30 ENCOUNTER — Telehealth: Payer: Self-pay | Admitting: *Deleted

## 2006-10-31 ENCOUNTER — Emergency Department (HOSPITAL_COMMUNITY): Admission: EM | Admit: 2006-10-31 | Discharge: 2006-10-31 | Payer: Self-pay | Admitting: Emergency Medicine

## 2006-11-03 ENCOUNTER — Emergency Department (HOSPITAL_COMMUNITY): Admission: EM | Admit: 2006-11-03 | Discharge: 2006-11-04 | Payer: Self-pay | Admitting: Emergency Medicine

## 2006-11-12 ENCOUNTER — Emergency Department (HOSPITAL_COMMUNITY): Admission: EM | Admit: 2006-11-12 | Discharge: 2006-11-12 | Payer: Self-pay | Admitting: Emergency Medicine

## 2006-11-22 ENCOUNTER — Emergency Department (HOSPITAL_COMMUNITY): Admission: EM | Admit: 2006-11-22 | Discharge: 2006-11-22 | Payer: Self-pay | Admitting: Emergency Medicine

## 2006-11-23 ENCOUNTER — Emergency Department (HOSPITAL_COMMUNITY): Admission: EM | Admit: 2006-11-23 | Discharge: 2006-11-24 | Payer: Self-pay | Admitting: Emergency Medicine

## 2006-11-24 ENCOUNTER — Encounter (INDEPENDENT_AMBULATORY_CARE_PROVIDER_SITE_OTHER): Payer: Self-pay | Admitting: Internal Medicine

## 2006-11-24 ENCOUNTER — Ambulatory Visit: Payer: Self-pay | Admitting: Internal Medicine

## 2006-11-24 DIAGNOSIS — M549 Dorsalgia, unspecified: Secondary | ICD-10-CM | POA: Insufficient documentation

## 2006-11-24 LAB — CONVERTED CEMR LAB
Hemoglobin, Urine: NEGATIVE
Leukocytes, UA: NEGATIVE
Protein, ur: NEGATIVE mg/dL
Urobilinogen, UA: 1 (ref 0.0–1.0)

## 2006-11-25 ENCOUNTER — Encounter (INDEPENDENT_AMBULATORY_CARE_PROVIDER_SITE_OTHER): Payer: Self-pay | Admitting: Internal Medicine

## 2006-11-26 ENCOUNTER — Emergency Department (HOSPITAL_COMMUNITY): Admission: EM | Admit: 2006-11-26 | Discharge: 2006-11-27 | Payer: Self-pay | Admitting: Emergency Medicine

## 2006-12-11 ENCOUNTER — Emergency Department (HOSPITAL_COMMUNITY): Admission: EM | Admit: 2006-12-11 | Discharge: 2006-12-12 | Payer: Self-pay | Admitting: Emergency Medicine

## 2007-01-14 ENCOUNTER — Emergency Department (HOSPITAL_COMMUNITY): Admission: EM | Admit: 2007-01-14 | Discharge: 2007-01-14 | Payer: Self-pay | Admitting: Emergency Medicine

## 2007-01-26 ENCOUNTER — Observation Stay (HOSPITAL_COMMUNITY): Admission: EM | Admit: 2007-01-26 | Discharge: 2007-01-27 | Payer: Self-pay | Admitting: Emergency Medicine

## 2007-01-26 ENCOUNTER — Ambulatory Visit: Payer: Self-pay | Admitting: Infectious Disease

## 2007-02-03 ENCOUNTER — Emergency Department (HOSPITAL_COMMUNITY): Admission: EM | Admit: 2007-02-03 | Discharge: 2007-02-03 | Payer: Self-pay | Admitting: *Deleted

## 2007-02-05 ENCOUNTER — Encounter (INDEPENDENT_AMBULATORY_CARE_PROVIDER_SITE_OTHER): Payer: Self-pay | Admitting: *Deleted

## 2007-02-05 ENCOUNTER — Encounter: Payer: Self-pay | Admitting: Licensed Clinical Social Worker

## 2007-02-05 ENCOUNTER — Ambulatory Visit: Payer: Self-pay | Admitting: Internal Medicine

## 2007-02-22 IMAGING — CR DG KNEE COMPLETE 4+V*L*
4 series · 4 of 4 positions shown · non-contrast
Comparison: none

CLINICAL DATA: Pain.  
 LEFT KNEE - 4 VIEWS:
 The patient has tricompartmental degenerative changes, most notably from the patellofemoral and medial compartments.  There is no joint effusion.  Negative for fracture.  Scattered densities anteriorly within the patellar tendon region noted, though this may reflect radiopaque debris.

[t knee oblique left (1 of 2)]
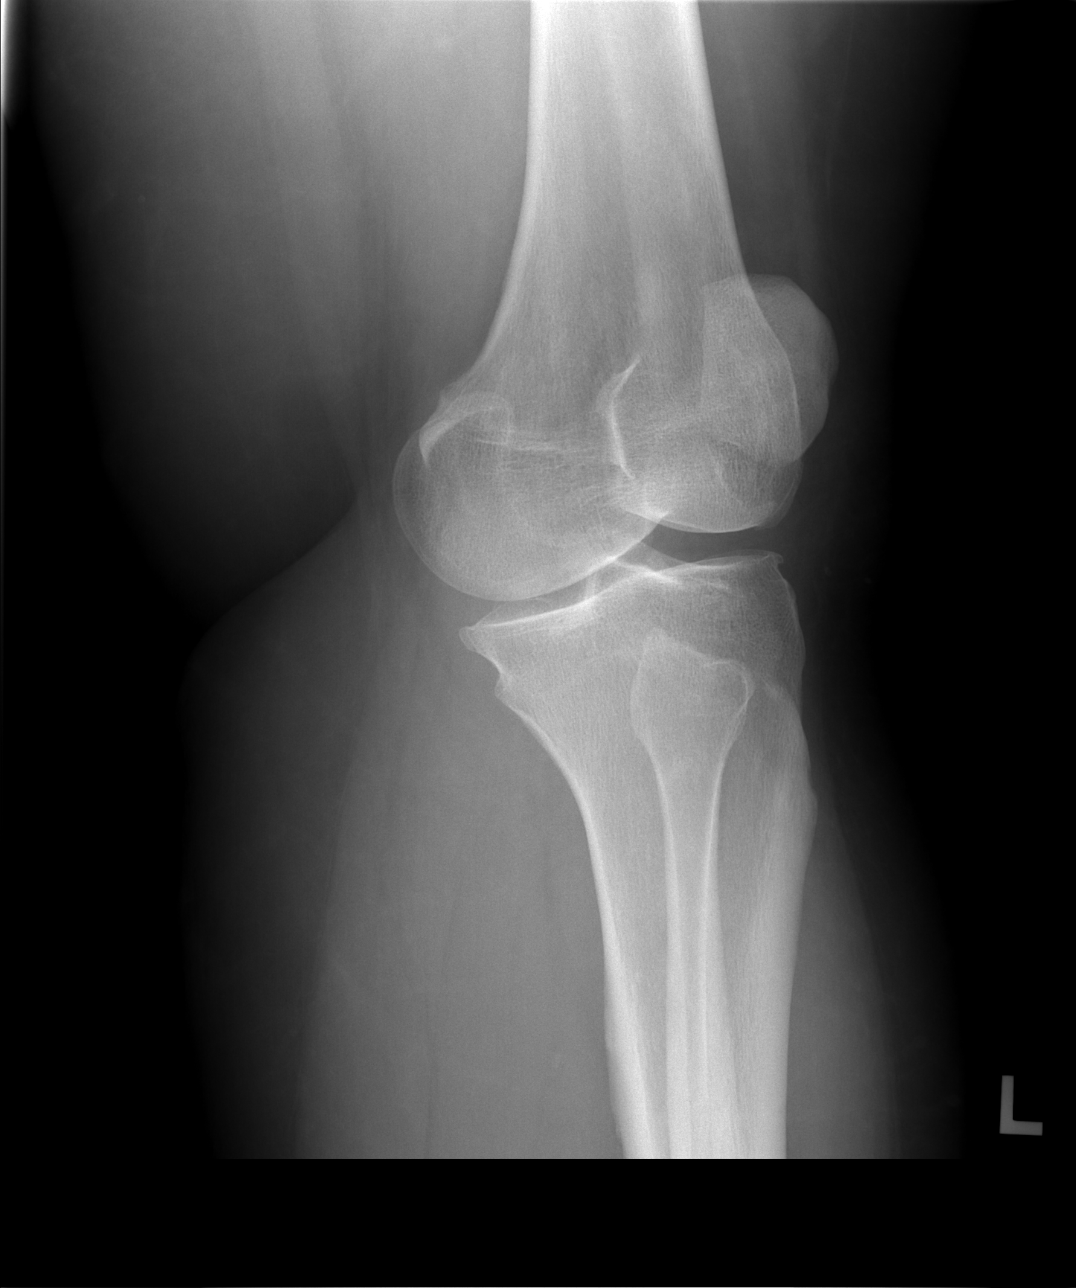

[t knee ap left]
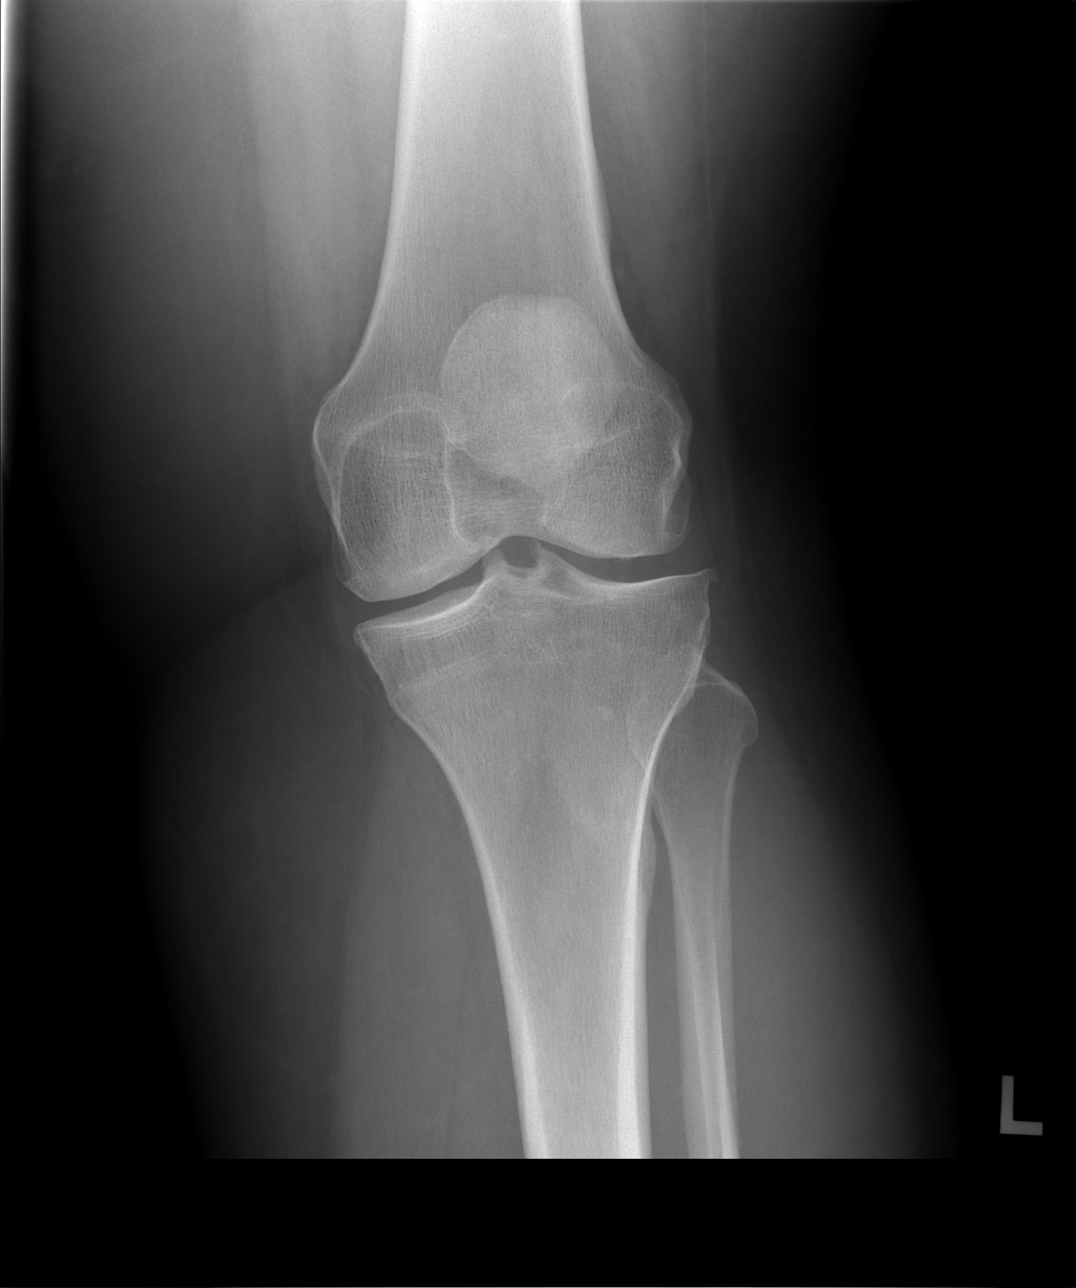

[t knee oblique left (2 of 2)]
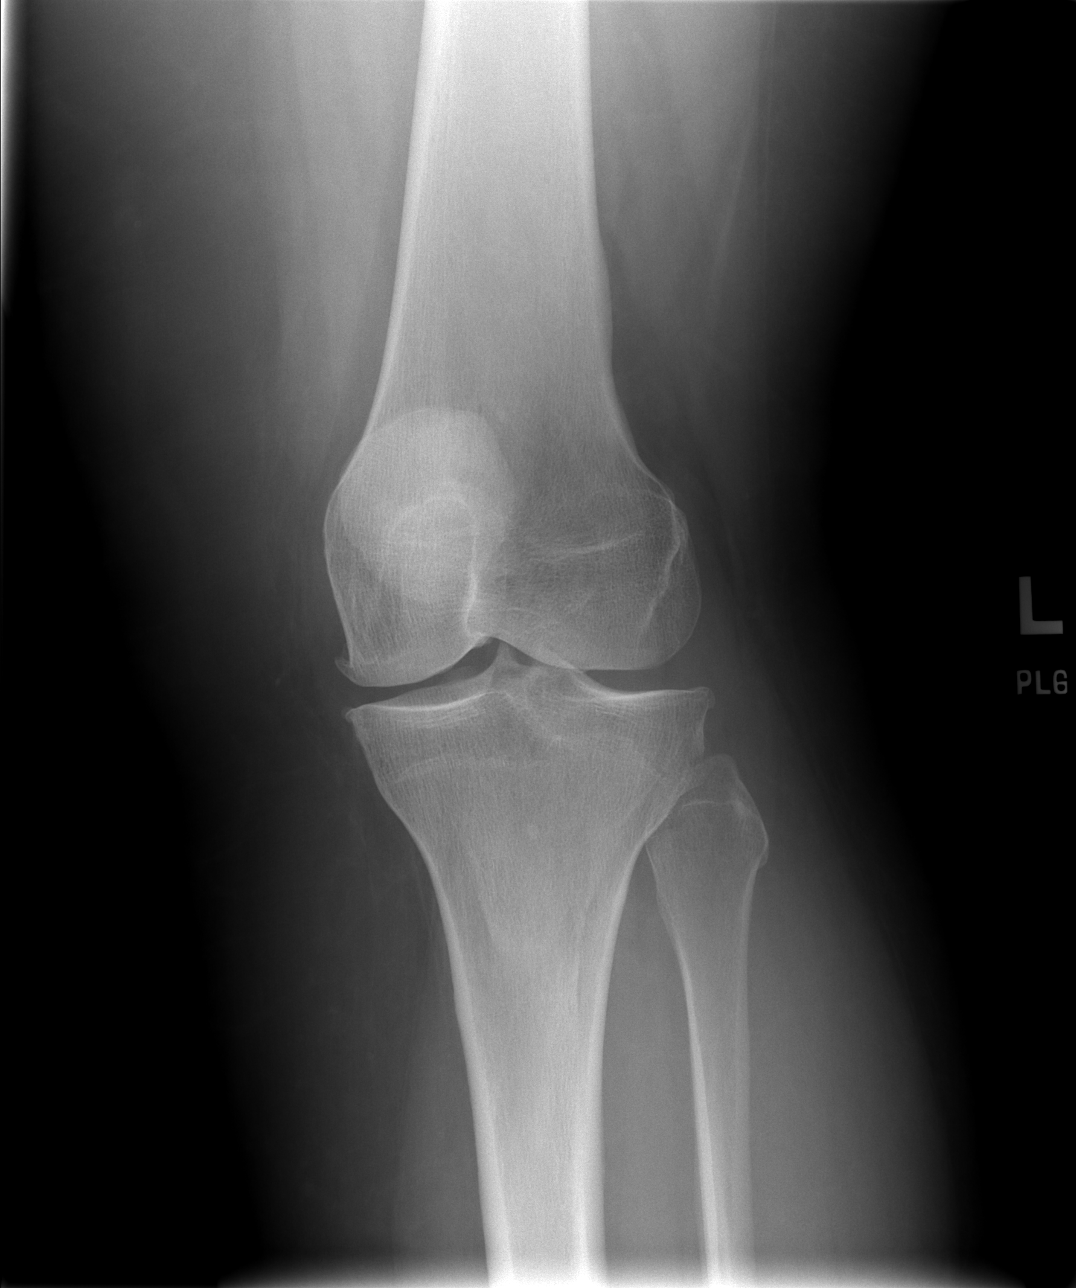

[t knee lat left]
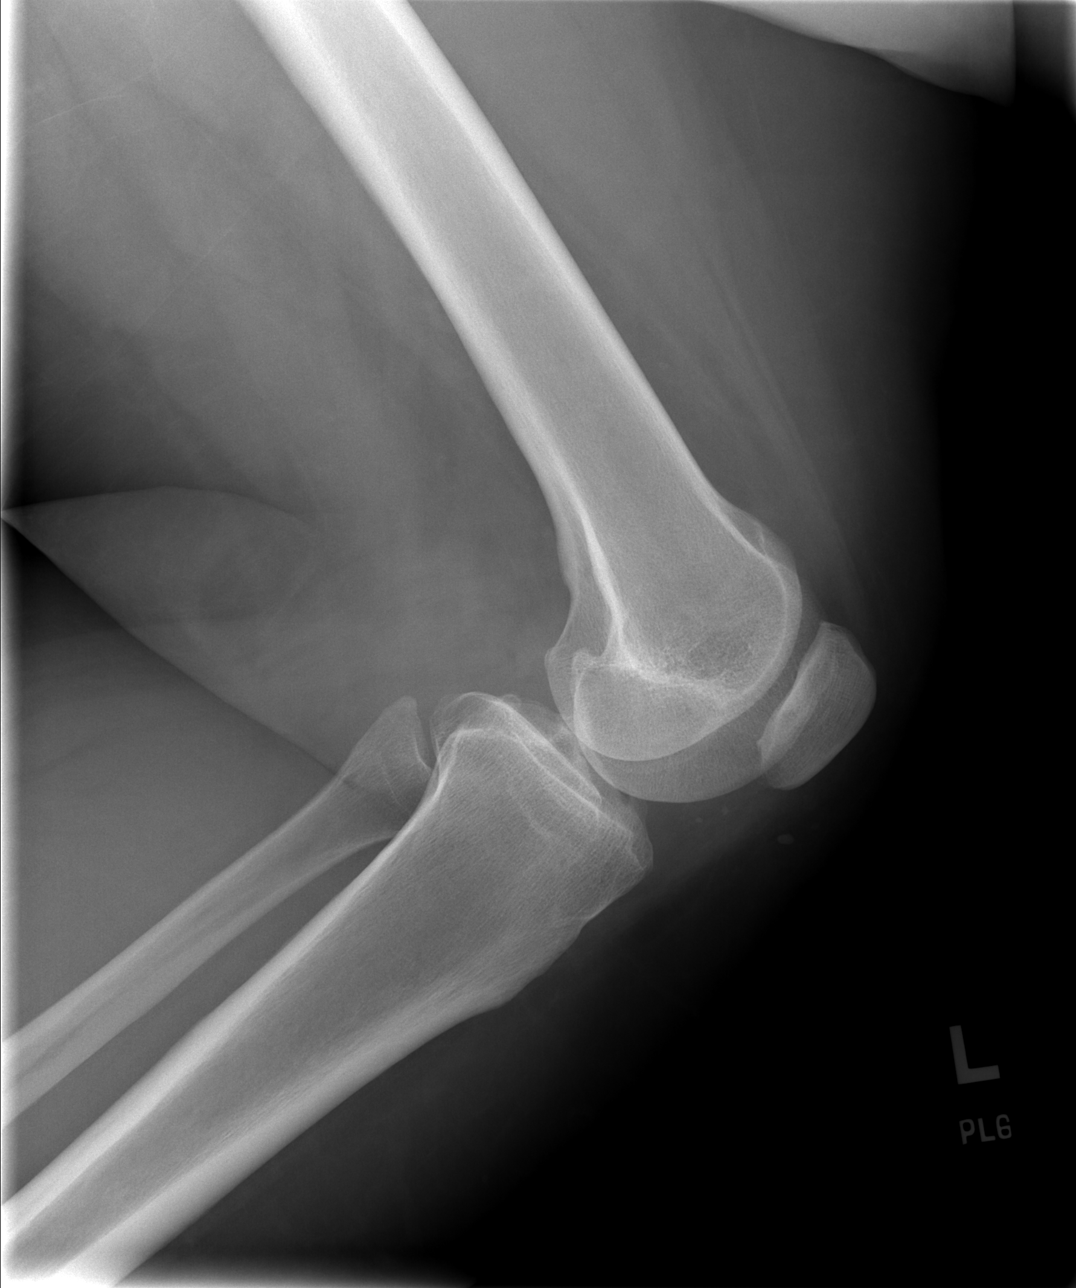

[4 of 4 positions shown; findings below may reference images not displayed]

IMPRESSION: Negative for fracture.

## 2007-02-22 IMAGING — CR DG HIP (WITH OR WITHOUT PELVIS) 2-3V*L*
4 series · 4 of 4 positions shown · non-contrast
Comparison: none

CLINICAL DATA: Injury; pain
LEFT HIP - 3 VIEW:
There is no evidence of hip fracture or dislocation.  There is no evidence of arthropathy or other focal bone abnormality.

[t pelvis a.p. (1 of 2)]
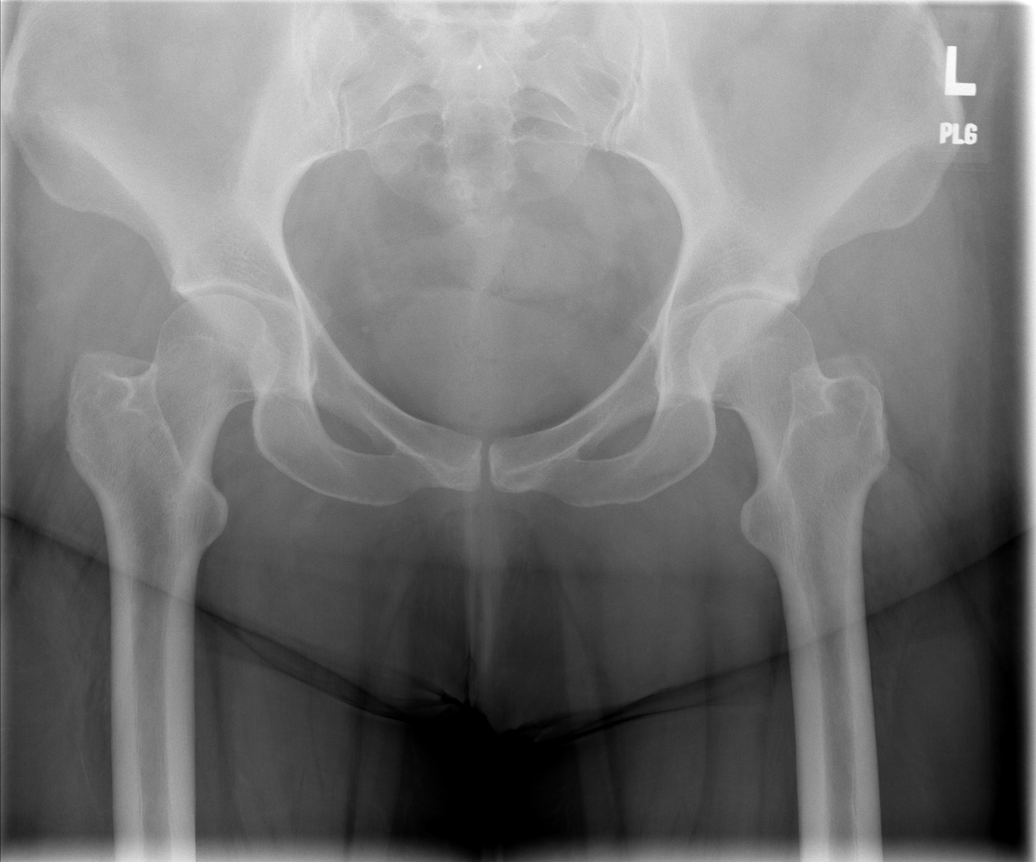

[t pelvis a.p. (2 of 2)]
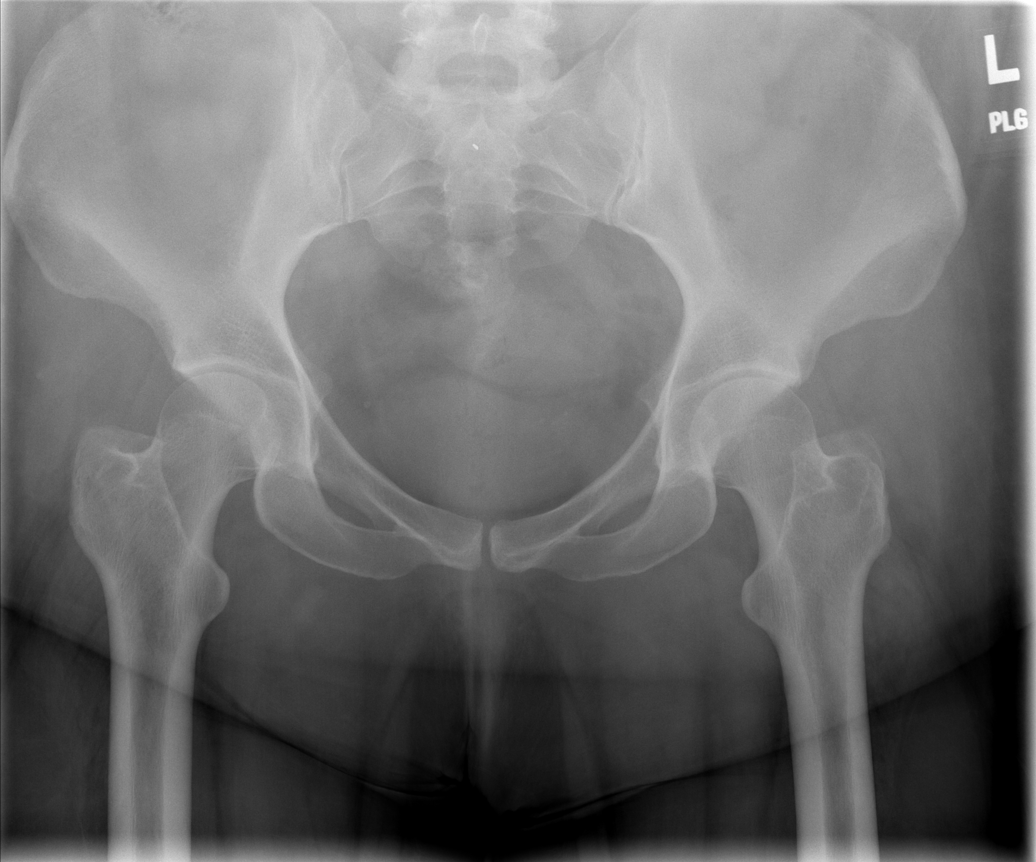

[t hip ap left]
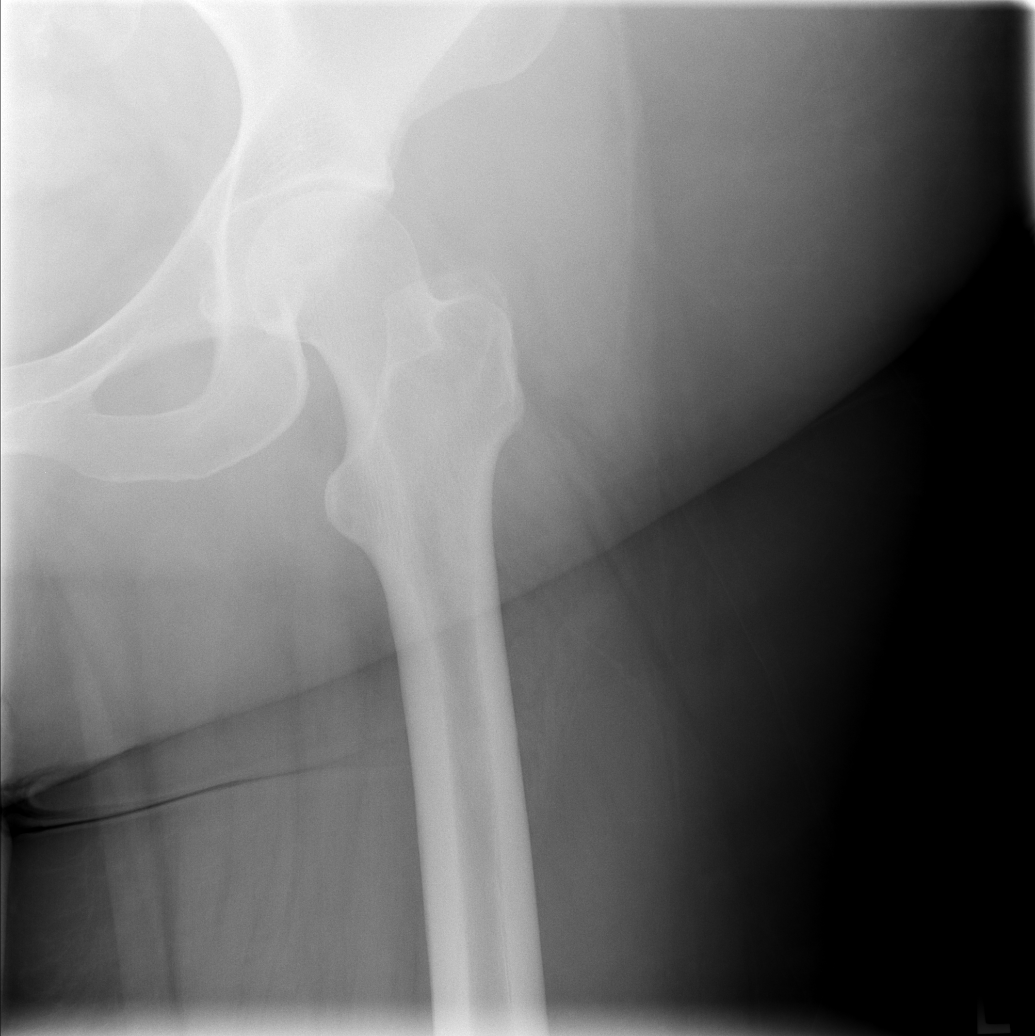

[t hip frog leg left]
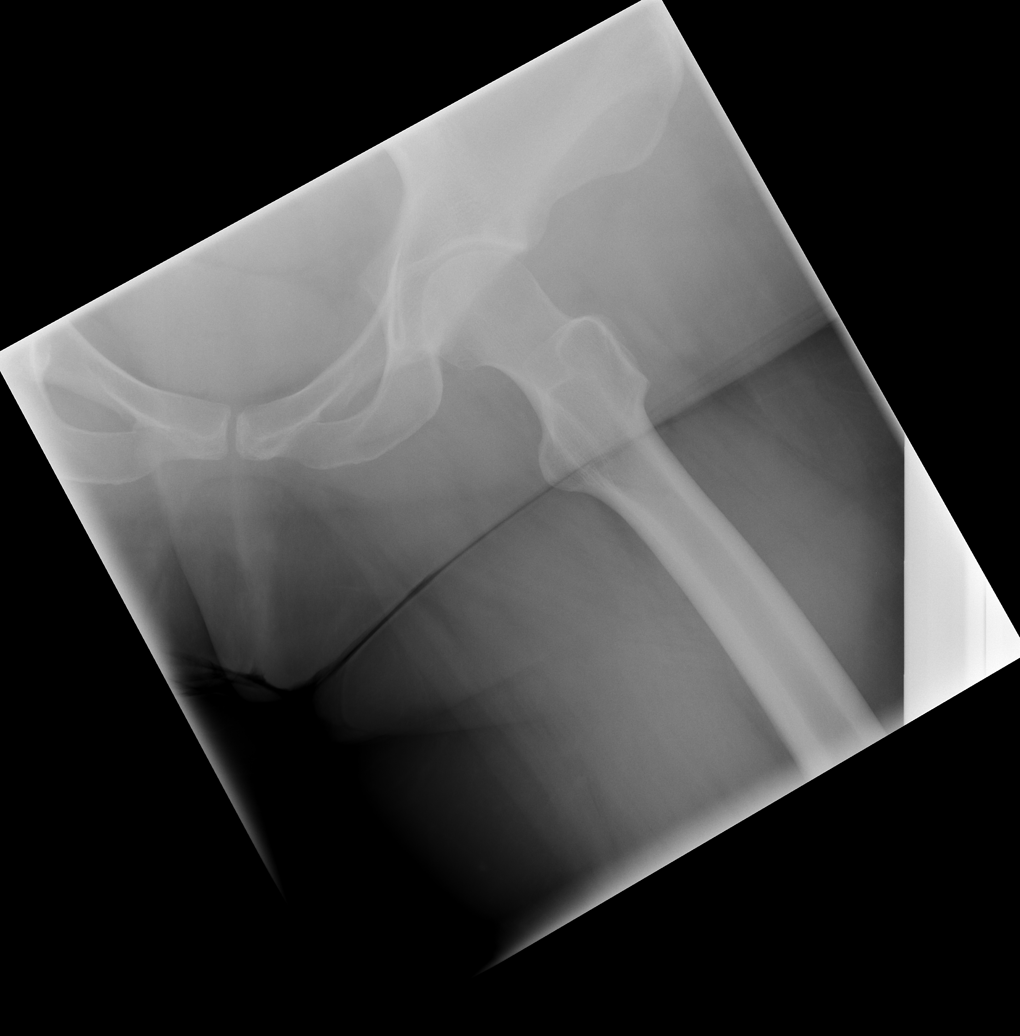

[4 of 4 positions shown; findings below may reference images not displayed]

IMPRESSION: Negative.

## 2007-03-10 ENCOUNTER — Emergency Department (HOSPITAL_COMMUNITY): Admission: EM | Admit: 2007-03-10 | Discharge: 2007-03-10 | Payer: Self-pay | Admitting: Emergency Medicine

## 2007-03-12 ENCOUNTER — Emergency Department (HOSPITAL_COMMUNITY): Admission: EM | Admit: 2007-03-12 | Discharge: 2007-03-12 | Payer: Self-pay | Admitting: Emergency Medicine

## 2007-03-26 ENCOUNTER — Emergency Department (HOSPITAL_COMMUNITY): Admission: EM | Admit: 2007-03-26 | Discharge: 2007-03-26 | Payer: Self-pay | Admitting: Emergency Medicine

## 2007-04-01 ENCOUNTER — Encounter: Payer: Self-pay | Admitting: Licensed Clinical Social Worker

## 2007-04-01 ENCOUNTER — Ambulatory Visit: Payer: Self-pay | Admitting: Internal Medicine

## 2007-04-01 ENCOUNTER — Encounter (INDEPENDENT_AMBULATORY_CARE_PROVIDER_SITE_OTHER): Payer: Self-pay | Admitting: Infectious Diseases

## 2007-04-01 LAB — CONVERTED CEMR LAB
Bilirubin Urine: NEGATIVE
Blood in Urine, dipstick: NEGATIVE
Ketones, urine, test strip: NEGATIVE
Leukocytes, UA: NEGATIVE
Protein, U semiquant: NEGATIVE
Protein, ur: NEGATIVE mg/dL
Specific Gravity, Urine: 1.024 (ref 1.005–1.03)
Urobilinogen, UA: 1
Urobilinogen, UA: 1 (ref 0.0–1.0)
WBC Urine, dipstick: NEGATIVE

## 2007-04-20 ENCOUNTER — Encounter (INDEPENDENT_AMBULATORY_CARE_PROVIDER_SITE_OTHER): Payer: Self-pay | Admitting: Internal Medicine

## 2007-04-20 ENCOUNTER — Ambulatory Visit: Payer: Self-pay | Admitting: Internal Medicine

## 2007-05-05 ENCOUNTER — Emergency Department (HOSPITAL_COMMUNITY): Admission: EM | Admit: 2007-05-05 | Discharge: 2007-05-05 | Payer: Self-pay | Admitting: Emergency Medicine

## 2007-05-18 ENCOUNTER — Emergency Department (HOSPITAL_COMMUNITY): Admission: EM | Admit: 2007-05-18 | Discharge: 2007-05-19 | Payer: Self-pay | Admitting: Emergency Medicine

## 2007-06-05 ENCOUNTER — Emergency Department (HOSPITAL_COMMUNITY): Admission: EM | Admit: 2007-06-05 | Discharge: 2007-06-05 | Payer: Self-pay | Admitting: Emergency Medicine

## 2007-06-18 ENCOUNTER — Emergency Department (HOSPITAL_COMMUNITY): Admission: EM | Admit: 2007-06-18 | Discharge: 2007-06-18 | Payer: Self-pay | Admitting: Emergency Medicine

## 2007-06-18 IMAGING — CR DG CHEST 2V
2 series · 2 of 2 positions shown · non-contrast
Comparison: 04/09/2004.

CLINICAL DATA: Cough, shortness of breath and back pain. History of
hypertension, asthma and diabetes.

CHEST - 2 VIEW

[w chest pa *]
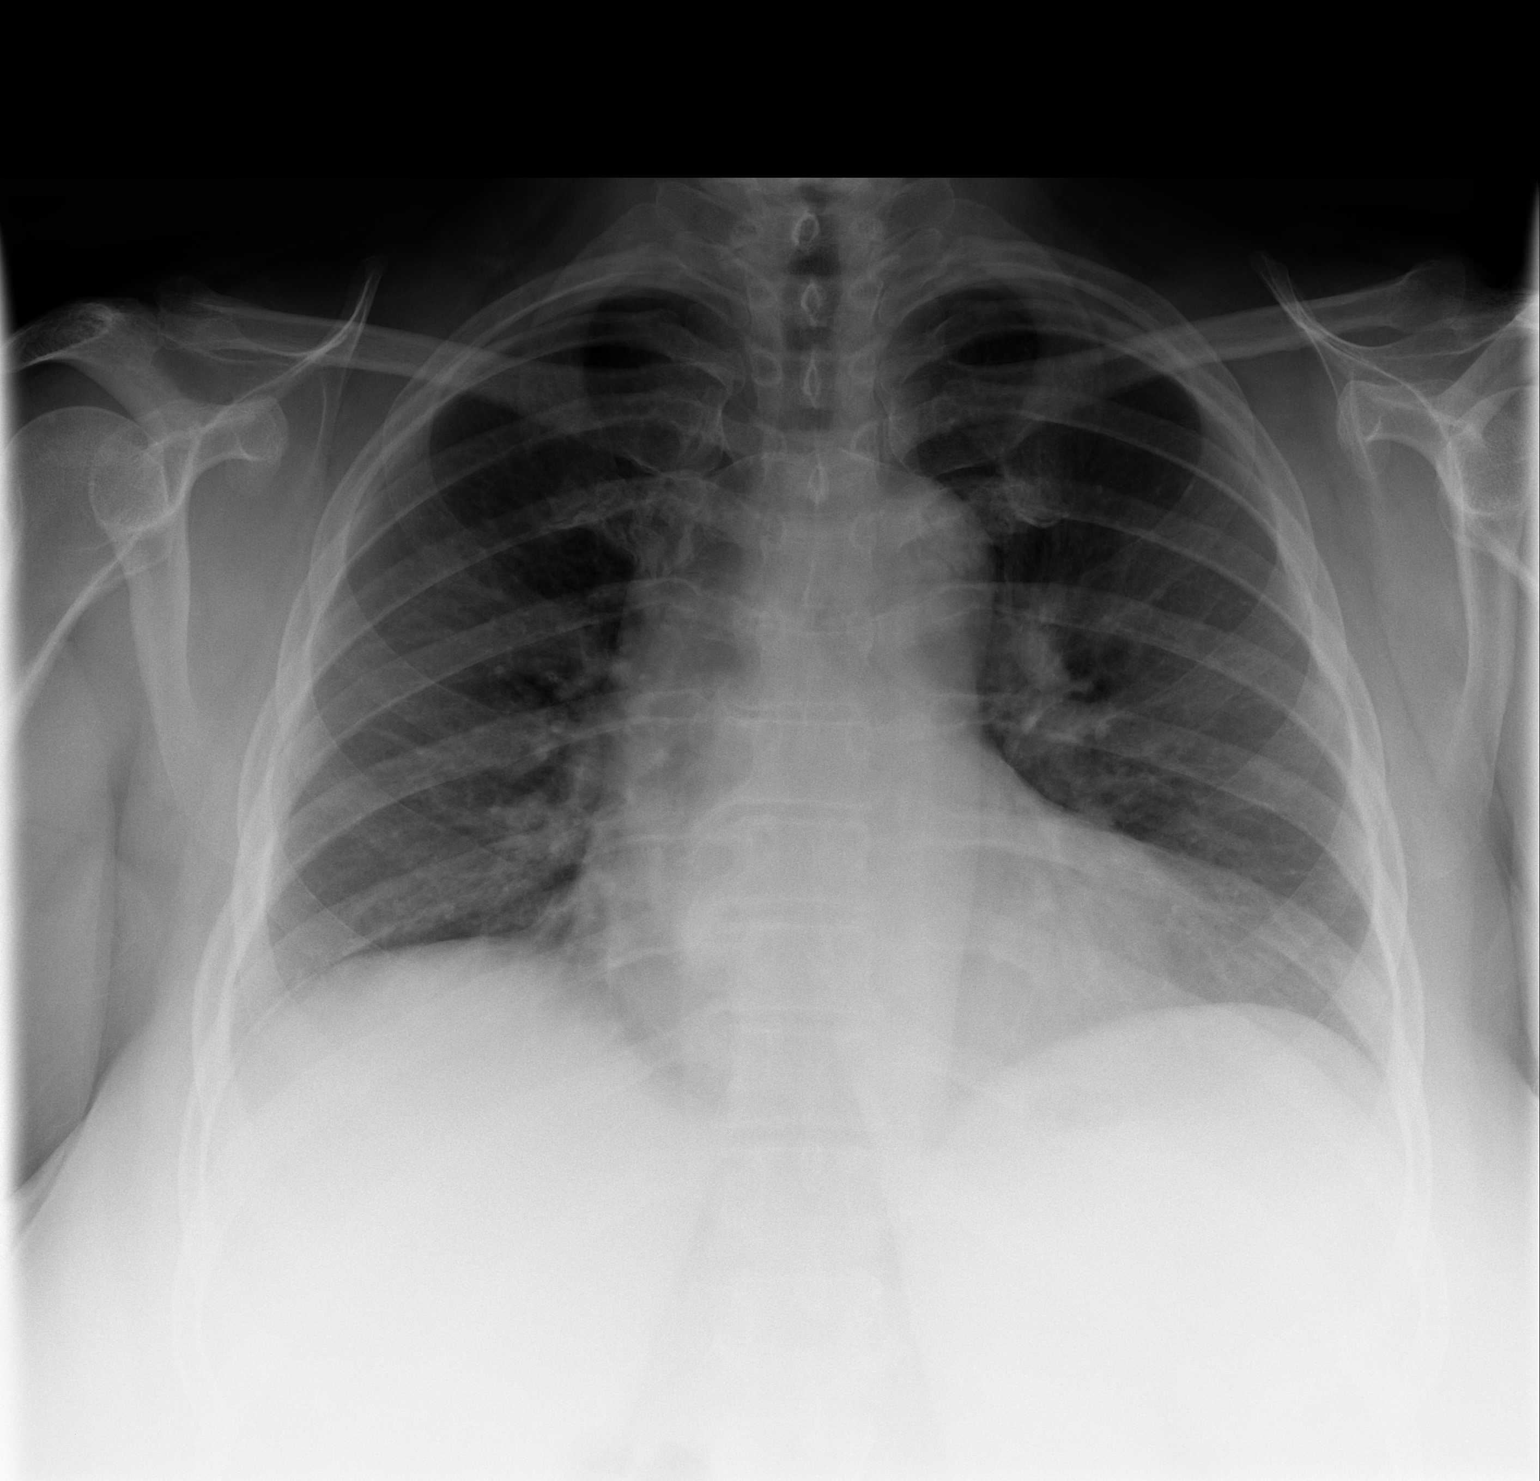

[w chest lat *]
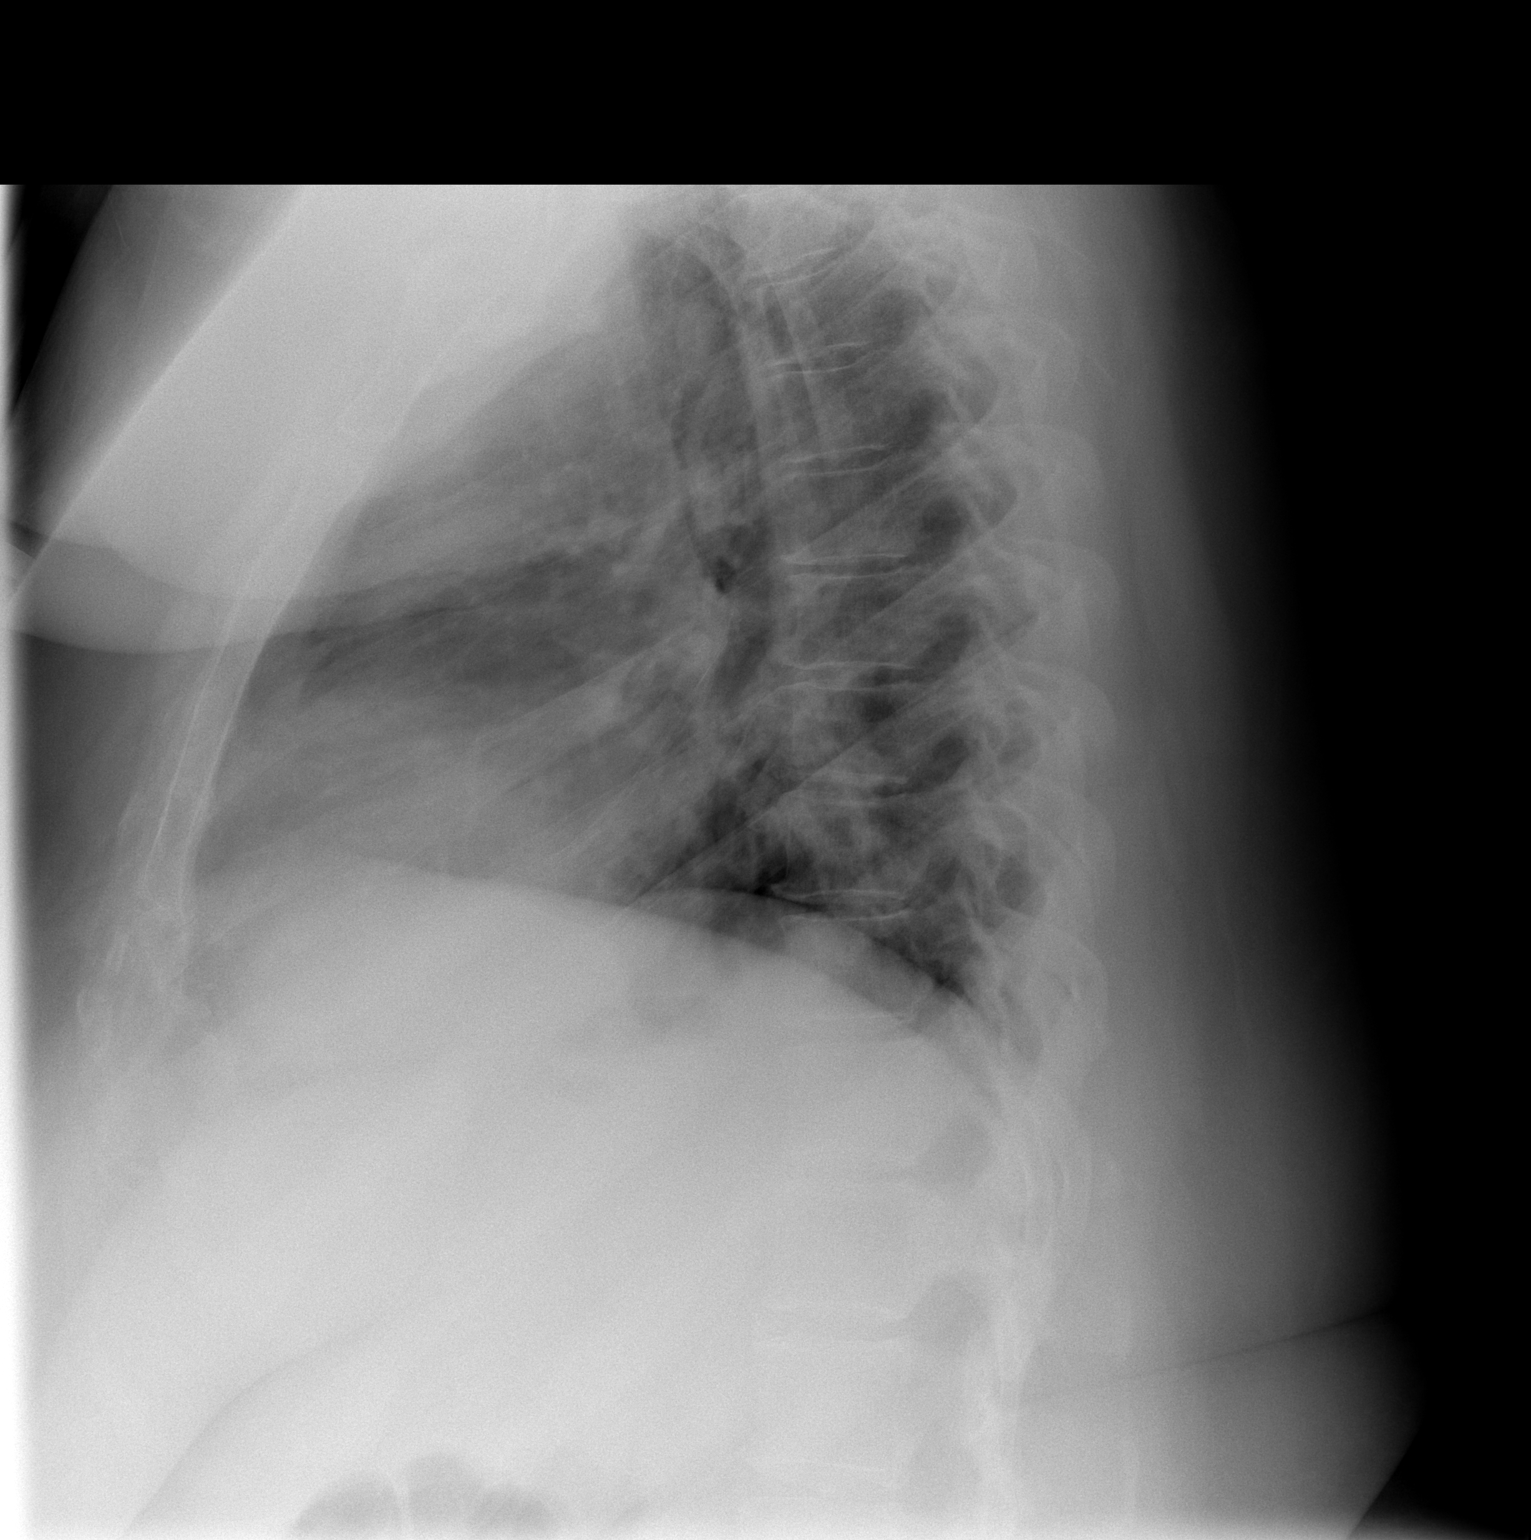

[2 of 2 positions shown; findings below may reference images not displayed]

FINDINGS: Improved inspiration. Stable borderline enlarged cardiac silhouette.
Clear lungs with normal vascularity. Thoracic spine degenerative changes.
Obesity.

IMPRESSION

No acute abnormality.

## 2007-06-21 ENCOUNTER — Ambulatory Visit: Payer: Self-pay | Admitting: Internal Medicine

## 2007-06-21 LAB — CONVERTED CEMR LAB
Bilirubin Urine: NEGATIVE
Glucose, Urine, Semiquant: NEGATIVE
Protein, U semiquant: NEGATIVE
Specific Gravity, Urine: 1.01
WBC Urine, dipstick: NEGATIVE
pH: 7

## 2007-06-22 ENCOUNTER — Emergency Department (HOSPITAL_COMMUNITY): Admission: EM | Admit: 2007-06-22 | Discharge: 2007-06-22 | Payer: Self-pay | Admitting: Emergency Medicine

## 2007-06-22 ENCOUNTER — Encounter: Payer: Self-pay | Admitting: Licensed Clinical Social Worker

## 2007-06-22 IMAGING — CR DG CHEST 2V
2 series · 2 of 2 positions shown · non-contrast
Comparison: 11/29/04 and 04/09/04.

CLINICAL DATA: Shortness of breath.  Dyspnea.
 CHEST - 2 VIEW:

[w chest pa *]
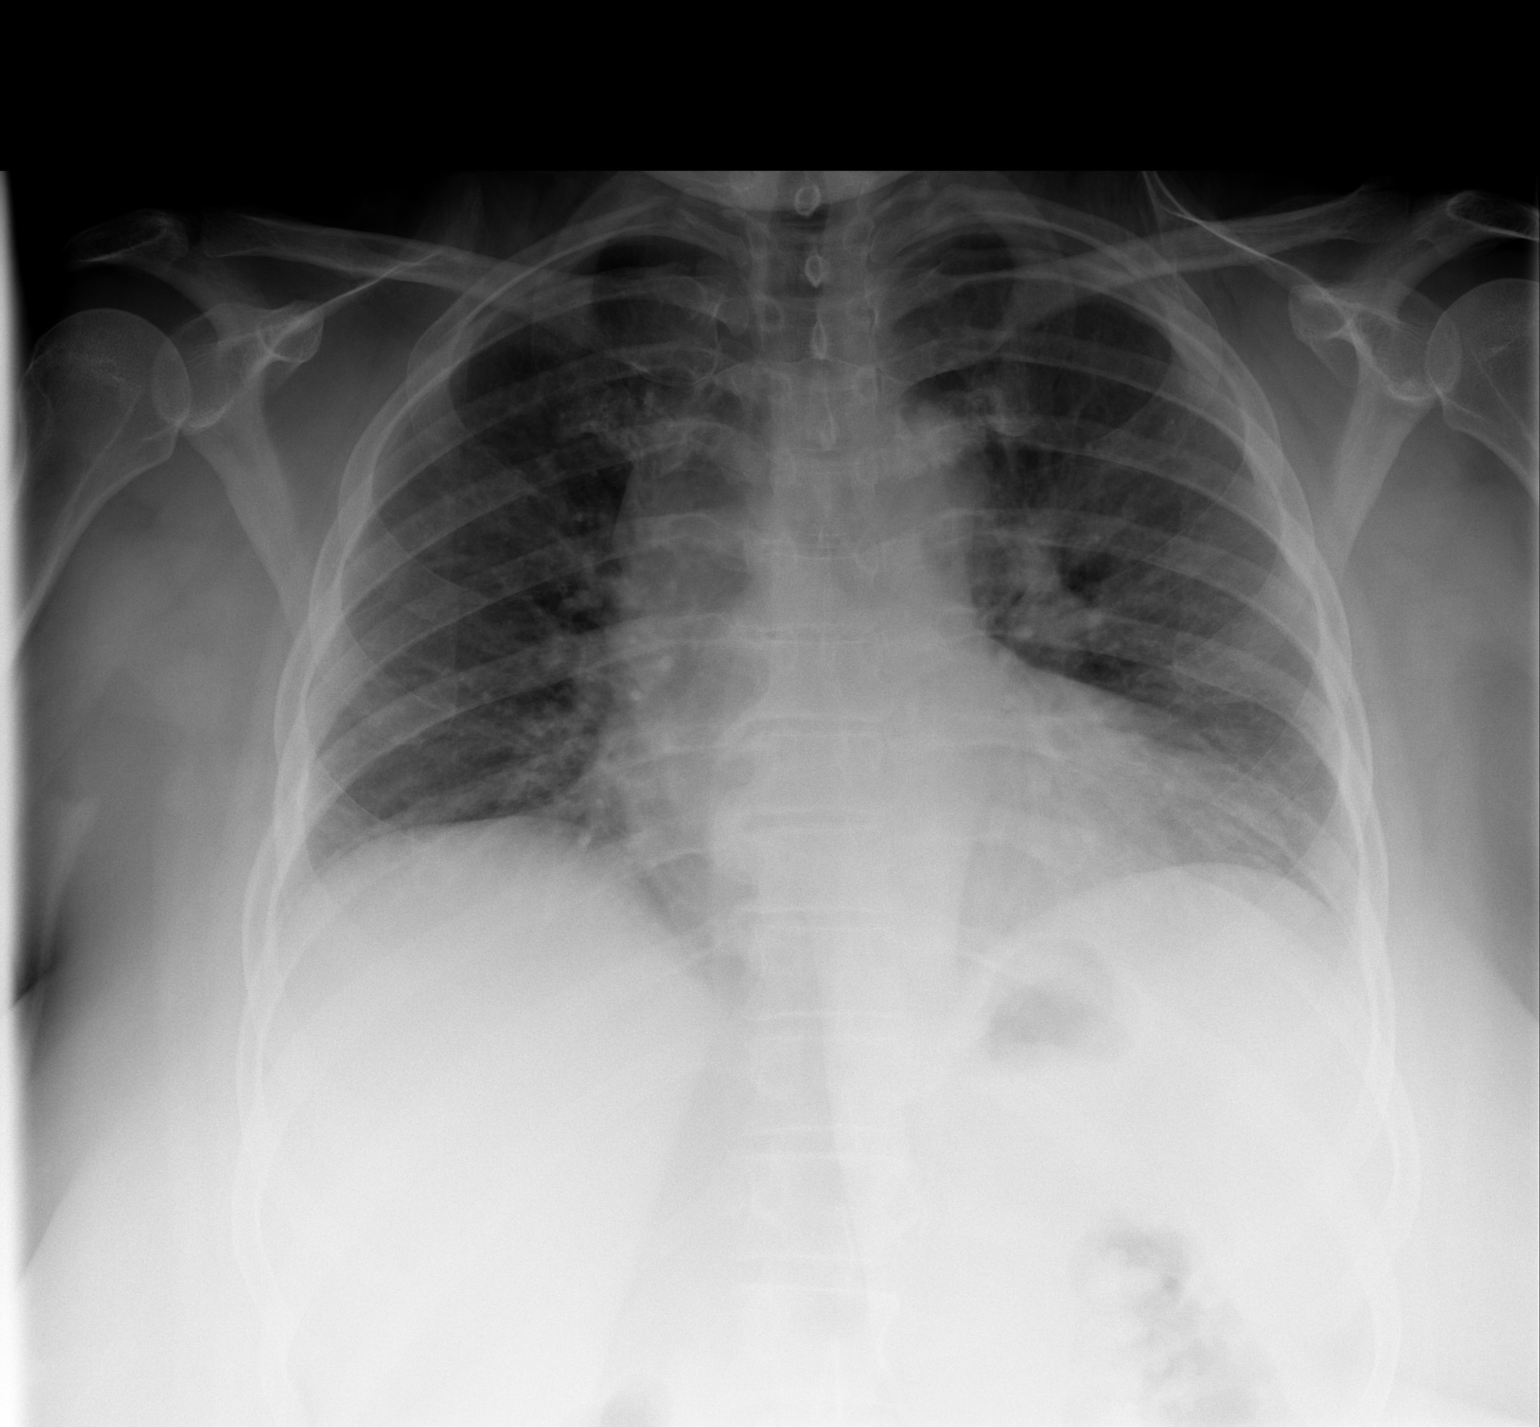

[w chest lat *]
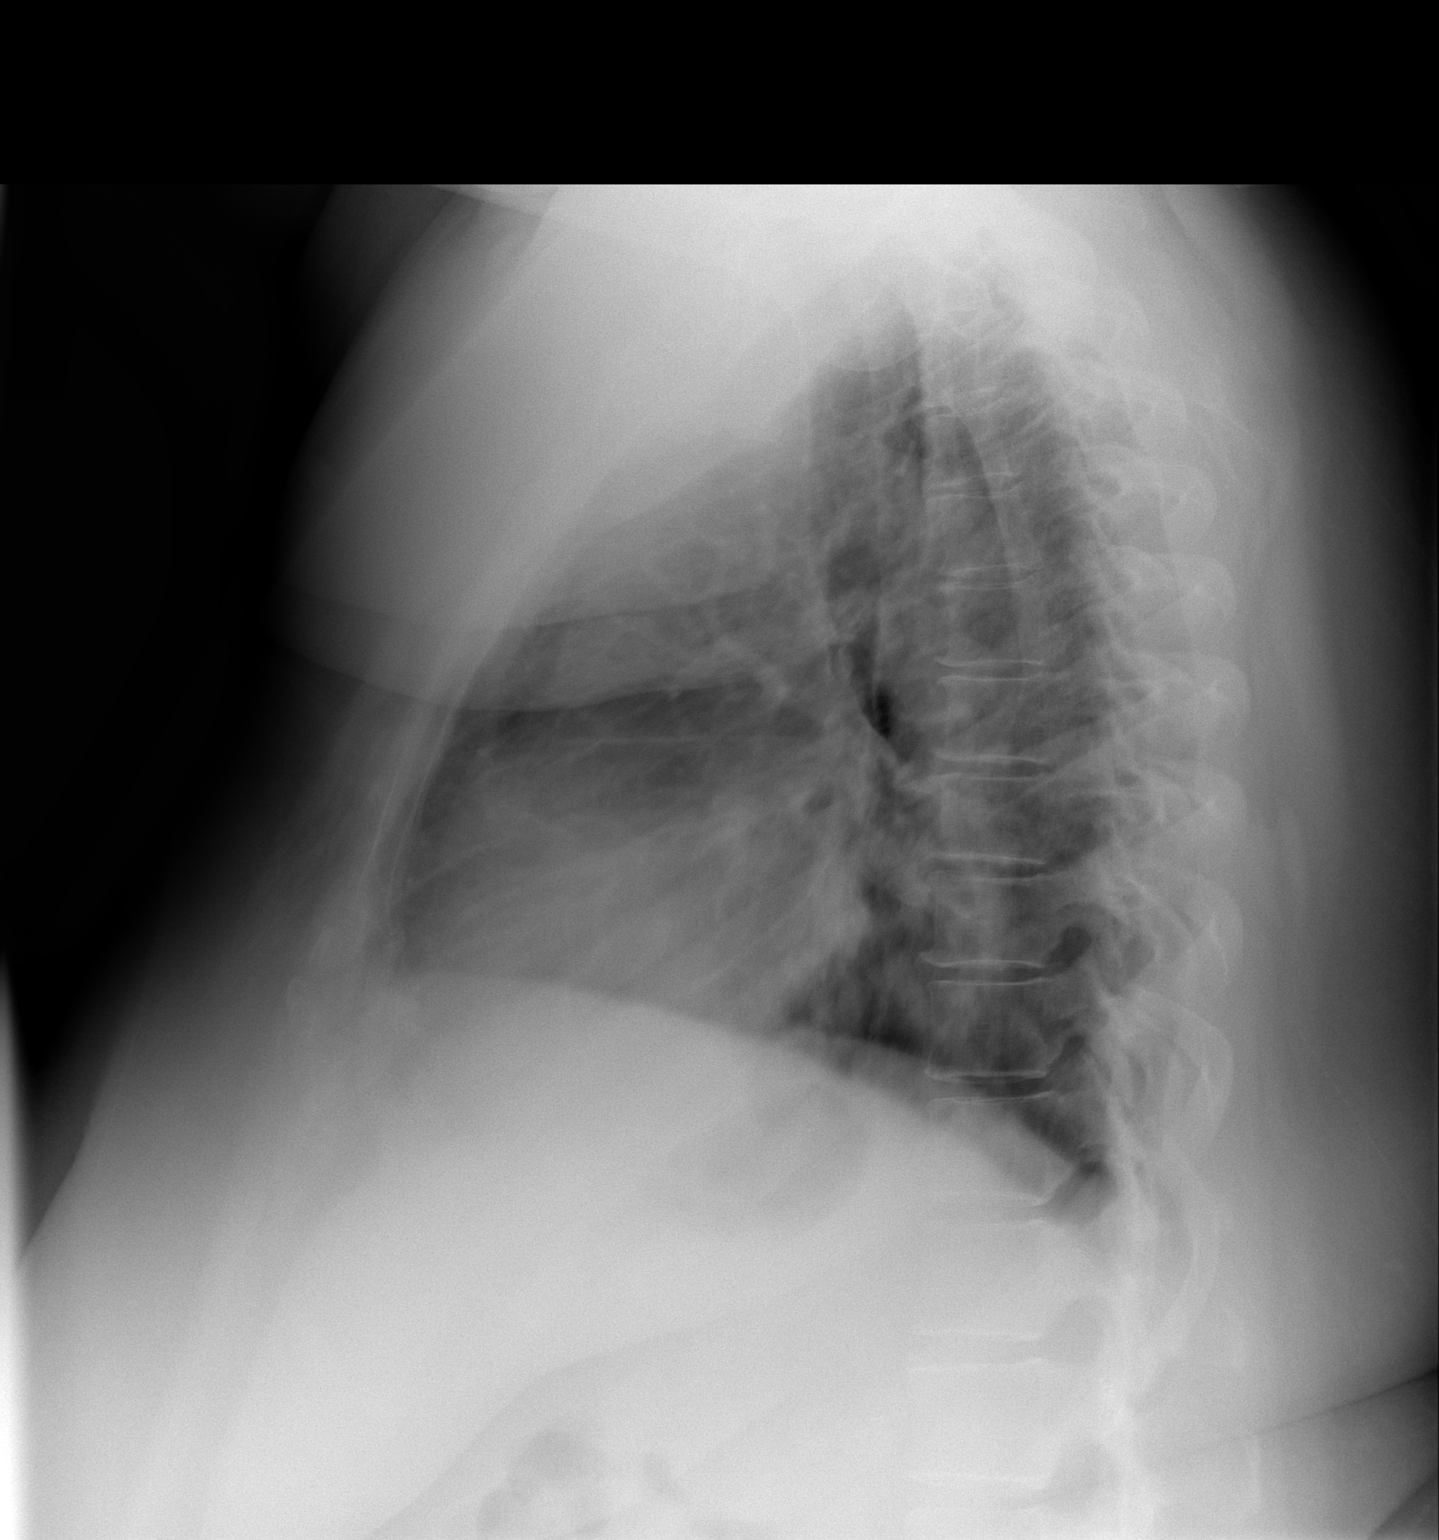

[2 of 2 positions shown; findings below may reference images not displayed]

FINDINGS: Low inspiratory lung volumes and mild cardiomegaly remain stable.  There is no evidence of acute infiltrate or pleural effusion.  No mass or adenopathy identified.  Tortuosity of the thoracic aorta remains stable.
IMPRESSION: Low lung volumes.  No acute cardiopulmonary disease.

## 2007-06-24 ENCOUNTER — Emergency Department (HOSPITAL_COMMUNITY): Admission: EM | Admit: 2007-06-24 | Discharge: 2007-06-24 | Payer: Self-pay | Admitting: Emergency Medicine

## 2007-06-27 ENCOUNTER — Emergency Department (HOSPITAL_COMMUNITY): Admission: EM | Admit: 2007-06-27 | Discharge: 2007-06-27 | Payer: Self-pay | Admitting: Emergency Medicine

## 2007-06-28 ENCOUNTER — Encounter (INDEPENDENT_AMBULATORY_CARE_PROVIDER_SITE_OTHER): Payer: Self-pay | Admitting: *Deleted

## 2007-06-28 ENCOUNTER — Ambulatory Visit: Payer: Self-pay | Admitting: Internal Medicine

## 2007-06-28 ENCOUNTER — Other Ambulatory Visit: Admission: RE | Admit: 2007-06-28 | Discharge: 2007-06-28 | Payer: Self-pay | Admitting: Internal Medicine

## 2007-06-28 ENCOUNTER — Encounter (INDEPENDENT_AMBULATORY_CARE_PROVIDER_SITE_OTHER): Payer: Self-pay | Admitting: Internal Medicine

## 2007-06-28 LAB — CONVERTED CEMR LAB

## 2007-06-29 ENCOUNTER — Encounter (INDEPENDENT_AMBULATORY_CARE_PROVIDER_SITE_OTHER): Payer: Self-pay | Admitting: *Deleted

## 2007-07-04 ENCOUNTER — Emergency Department (HOSPITAL_COMMUNITY): Admission: EM | Admit: 2007-07-04 | Discharge: 2007-07-04 | Payer: Self-pay | Admitting: Emergency Medicine

## 2007-07-05 ENCOUNTER — Ambulatory Visit (HOSPITAL_COMMUNITY): Admission: RE | Admit: 2007-07-05 | Discharge: 2007-07-05 | Payer: Self-pay | Admitting: *Deleted

## 2007-07-05 ENCOUNTER — Ambulatory Visit: Payer: Self-pay | Admitting: Internal Medicine

## 2007-07-13 ENCOUNTER — Ambulatory Visit: Payer: Self-pay | Admitting: Vascular Surgery

## 2007-07-13 ENCOUNTER — Emergency Department (HOSPITAL_COMMUNITY): Admission: EM | Admit: 2007-07-13 | Discharge: 2007-07-13 | Payer: Self-pay | Admitting: Emergency Medicine

## 2007-07-14 ENCOUNTER — Encounter (INDEPENDENT_AMBULATORY_CARE_PROVIDER_SITE_OTHER): Payer: Self-pay | Admitting: Internal Medicine

## 2007-07-14 ENCOUNTER — Ambulatory Visit (HOSPITAL_COMMUNITY): Admission: RE | Admit: 2007-07-14 | Discharge: 2007-07-14 | Payer: Self-pay | Admitting: Hospitalist

## 2007-07-14 ENCOUNTER — Ambulatory Visit: Payer: Self-pay | Admitting: Hospitalist

## 2007-07-22 ENCOUNTER — Ambulatory Visit: Payer: Self-pay | Admitting: Internal Medicine

## 2007-07-22 LAB — CONVERTED CEMR LAB
Bilirubin Urine: NEGATIVE
Glucose, Urine, Semiquant: NEGATIVE
Nitrite: NEGATIVE
Protein, U semiquant: 30
Specific Gravity, Urine: >=1.03
pH: 6

## 2007-07-23 ENCOUNTER — Encounter (INDEPENDENT_AMBULATORY_CARE_PROVIDER_SITE_OTHER): Payer: Self-pay | Admitting: *Deleted

## 2007-07-24 ENCOUNTER — Emergency Department (HOSPITAL_COMMUNITY): Admission: EM | Admit: 2007-07-24 | Discharge: 2007-07-24 | Payer: Self-pay | Admitting: Emergency Medicine

## 2007-08-04 ENCOUNTER — Ambulatory Visit: Payer: Self-pay | Admitting: Internal Medicine

## 2007-08-06 ENCOUNTER — Emergency Department (HOSPITAL_COMMUNITY): Admission: EM | Admit: 2007-08-06 | Discharge: 2007-08-06 | Payer: Self-pay | Admitting: Family Medicine

## 2007-08-06 ENCOUNTER — Encounter: Payer: Self-pay | Admitting: Licensed Clinical Social Worker

## 2007-08-17 ENCOUNTER — Emergency Department (HOSPITAL_COMMUNITY): Admission: EM | Admit: 2007-08-17 | Discharge: 2007-08-17 | Payer: Self-pay | Admitting: Emergency Medicine

## 2007-08-20 ENCOUNTER — Telehealth: Payer: Self-pay | Admitting: Licensed Clinical Social Worker

## 2007-08-23 ENCOUNTER — Emergency Department (HOSPITAL_COMMUNITY): Admission: EM | Admit: 2007-08-23 | Discharge: 2007-08-23 | Payer: Self-pay | Admitting: Emergency Medicine

## 2007-09-01 ENCOUNTER — Encounter: Payer: Self-pay | Admitting: Emergency Medicine

## 2007-09-01 ENCOUNTER — Ambulatory Visit: Payer: Self-pay | Admitting: Hospitalist

## 2007-09-01 ENCOUNTER — Inpatient Hospital Stay (HOSPITAL_COMMUNITY): Admission: AD | Admit: 2007-09-01 | Discharge: 2007-09-03 | Payer: Self-pay | Admitting: Hospitalist

## 2007-09-06 ENCOUNTER — Ambulatory Visit: Payer: Self-pay | Admitting: *Deleted

## 2007-09-09 ENCOUNTER — Ambulatory Visit: Payer: Self-pay | Admitting: Internal Medicine

## 2007-09-09 ENCOUNTER — Encounter (INDEPENDENT_AMBULATORY_CARE_PROVIDER_SITE_OTHER): Payer: Self-pay | Admitting: *Deleted

## 2007-09-09 LAB — CONVERTED CEMR LAB
Barbiturate Quant, Ur: NEGATIVE
Benzodiazepines.: NEGATIVE
Cocaine Metabolites: NEGATIVE
Creatinine,U: 108.8 mg/dL
Methadone: NEGATIVE

## 2007-09-15 ENCOUNTER — Telehealth: Payer: Self-pay | Admitting: Licensed Clinical Social Worker

## 2007-09-16 ENCOUNTER — Emergency Department (HOSPITAL_COMMUNITY): Admission: EM | Admit: 2007-09-16 | Discharge: 2007-09-16 | Payer: Self-pay | Admitting: Emergency Medicine

## 2007-09-16 LAB — CONVERTED CEMR LAB
CO2: 30 meq/L (ref 19–32)
Calcium: 8.7 mg/dL (ref 8.4–10.5)
Creatinine, Ser: 0.9 mg/dL (ref 0.40–1.20)
HCT: 43 % (ref 36.0–46.0)
MCHC: 29.8 g/dL — ABNORMAL LOW (ref 30.0–36.0)
MCV: 94.9 fL (ref 78.0–100.0)
Platelets: 287 10*3/uL (ref 150–400)
Sodium: 141 meq/L (ref 135–145)
WBC: 13 10*3/uL — ABNORMAL HIGH (ref 4.0–10.5)

## 2007-09-20 ENCOUNTER — Telehealth: Payer: Self-pay | Admitting: Licensed Clinical Social Worker

## 2007-09-21 ENCOUNTER — Telehealth (INDEPENDENT_AMBULATORY_CARE_PROVIDER_SITE_OTHER): Payer: Self-pay | Admitting: Internal Medicine

## 2007-09-30 ENCOUNTER — Emergency Department (HOSPITAL_COMMUNITY): Admission: EM | Admit: 2007-09-30 | Discharge: 2007-09-30 | Payer: Self-pay | Admitting: Emergency Medicine

## 2007-10-01 ENCOUNTER — Encounter (INDEPENDENT_AMBULATORY_CARE_PROVIDER_SITE_OTHER): Payer: Self-pay | Admitting: *Deleted

## 2007-10-01 ENCOUNTER — Ambulatory Visit: Payer: Self-pay | Admitting: *Deleted

## 2007-10-01 DIAGNOSIS — N3941 Urge incontinence: Secondary | ICD-10-CM | POA: Insufficient documentation

## 2007-10-01 LAB — CONVERTED CEMR LAB
CO2: 25 meq/L (ref 19–32)
Calcium: 8.9 mg/dL (ref 8.4–10.5)
Leukocytes, UA: NEGATIVE
Nitrite: NEGATIVE
Potassium: 4.1 meq/L (ref 3.5–5.3)
Sodium: 143 meq/L (ref 135–145)
Specific Gravity, Urine: 1.025 (ref 1.005–1.03)
Urine Glucose: NEGATIVE mg/dL
pH: 5 (ref 5.0–8.0)

## 2007-10-05 ENCOUNTER — Emergency Department (HOSPITAL_COMMUNITY): Admission: EM | Admit: 2007-10-05 | Discharge: 2007-10-06 | Payer: Self-pay | Admitting: Emergency Medicine

## 2007-10-06 ENCOUNTER — Ambulatory Visit: Payer: Self-pay | Admitting: Infectious Diseases

## 2007-10-12 ENCOUNTER — Telehealth (INDEPENDENT_AMBULATORY_CARE_PROVIDER_SITE_OTHER): Payer: Self-pay | Admitting: *Deleted

## 2007-10-12 ENCOUNTER — Emergency Department (HOSPITAL_COMMUNITY): Admission: EM | Admit: 2007-10-12 | Discharge: 2007-10-12 | Payer: Self-pay | Admitting: Emergency Medicine

## 2007-10-14 ENCOUNTER — Encounter (INDEPENDENT_AMBULATORY_CARE_PROVIDER_SITE_OTHER): Payer: Self-pay | Admitting: Internal Medicine

## 2007-10-14 ENCOUNTER — Ambulatory Visit: Payer: Self-pay | Admitting: Infectious Diseases

## 2007-10-14 ENCOUNTER — Ambulatory Visit (HOSPITAL_COMMUNITY): Admission: RE | Admit: 2007-10-14 | Discharge: 2007-10-14 | Payer: Self-pay | Admitting: Infectious Diseases

## 2007-10-20 ENCOUNTER — Ambulatory Visit: Payer: Self-pay | Admitting: Internal Medicine

## 2007-10-22 ENCOUNTER — Emergency Department (HOSPITAL_COMMUNITY): Admission: EM | Admit: 2007-10-22 | Discharge: 2007-10-23 | Payer: Self-pay | Admitting: Emergency Medicine

## 2007-10-25 ENCOUNTER — Ambulatory Visit: Payer: Self-pay | Admitting: Infectious Diseases

## 2007-10-28 ENCOUNTER — Encounter (INDEPENDENT_AMBULATORY_CARE_PROVIDER_SITE_OTHER): Payer: Self-pay | Admitting: Internal Medicine

## 2007-11-03 ENCOUNTER — Ambulatory Visit: Payer: Self-pay | Admitting: Infectious Diseases

## 2007-11-04 ENCOUNTER — Encounter: Payer: Self-pay | Admitting: Infectious Diseases

## 2007-11-10 ENCOUNTER — Emergency Department (HOSPITAL_COMMUNITY): Admission: EM | Admit: 2007-11-10 | Discharge: 2007-11-10 | Payer: Self-pay | Admitting: Emergency Medicine

## 2007-11-11 ENCOUNTER — Encounter (INDEPENDENT_AMBULATORY_CARE_PROVIDER_SITE_OTHER): Payer: Self-pay | Admitting: Internal Medicine

## 2007-11-13 ENCOUNTER — Emergency Department (HOSPITAL_COMMUNITY): Admission: EM | Admit: 2007-11-13 | Discharge: 2007-11-13 | Payer: Self-pay | Admitting: Emergency Medicine

## 2007-11-20 ENCOUNTER — Emergency Department (HOSPITAL_COMMUNITY): Admission: EM | Admit: 2007-11-20 | Discharge: 2007-11-21 | Payer: Self-pay | Admitting: Emergency Medicine

## 2007-12-10 ENCOUNTER — Ambulatory Visit: Payer: Self-pay | Admitting: Internal Medicine

## 2007-12-10 LAB — CONVERTED CEMR LAB
Glucose, Urine, Semiquant: NEGATIVE
Urobilinogen, UA: 0.2
pH: 5

## 2007-12-11 ENCOUNTER — Encounter (INDEPENDENT_AMBULATORY_CARE_PROVIDER_SITE_OTHER): Payer: Self-pay | Admitting: *Deleted

## 2008-01-02 ENCOUNTER — Emergency Department (HOSPITAL_COMMUNITY): Admission: EM | Admit: 2008-01-02 | Discharge: 2008-01-02 | Payer: Self-pay | Admitting: Emergency Medicine

## 2008-01-17 ENCOUNTER — Telehealth (INDEPENDENT_AMBULATORY_CARE_PROVIDER_SITE_OTHER): Payer: Self-pay | Admitting: Internal Medicine

## 2008-01-21 ENCOUNTER — Inpatient Hospital Stay (HOSPITAL_COMMUNITY): Admission: AD | Admit: 2008-01-21 | Discharge: 2008-01-25 | Payer: Self-pay | Admitting: Infectious Disease

## 2008-01-21 ENCOUNTER — Ambulatory Visit: Payer: Self-pay | Admitting: Pulmonary Disease

## 2008-01-21 ENCOUNTER — Ambulatory Visit: Payer: Self-pay | Admitting: Internal Medicine

## 2008-01-29 ENCOUNTER — Emergency Department (HOSPITAL_COMMUNITY): Admission: EM | Admit: 2008-01-29 | Discharge: 2008-01-29 | Payer: Self-pay | Admitting: Emergency Medicine

## 2008-02-05 ENCOUNTER — Emergency Department (HOSPITAL_COMMUNITY): Admission: EM | Admit: 2008-02-05 | Discharge: 2008-02-06 | Payer: Self-pay | Admitting: Emergency Medicine

## 2008-02-16 ENCOUNTER — Ambulatory Visit: Payer: Self-pay | Admitting: Internal Medicine

## 2008-02-16 ENCOUNTER — Inpatient Hospital Stay (HOSPITAL_COMMUNITY): Admission: EM | Admit: 2008-02-16 | Discharge: 2008-02-18 | Payer: Self-pay | Admitting: *Deleted

## 2008-02-16 ENCOUNTER — Encounter: Payer: Self-pay | Admitting: Internal Medicine

## 2008-02-24 ENCOUNTER — Emergency Department (HOSPITAL_COMMUNITY): Admission: EM | Admit: 2008-02-24 | Discharge: 2008-02-24 | Payer: Self-pay | Admitting: Emergency Medicine

## 2008-02-24 ENCOUNTER — Ambulatory Visit: Payer: Self-pay | Admitting: Internal Medicine

## 2008-03-01 ENCOUNTER — Telehealth (INDEPENDENT_AMBULATORY_CARE_PROVIDER_SITE_OTHER): Payer: Self-pay | Admitting: Internal Medicine

## 2008-03-09 ENCOUNTER — Emergency Department (HOSPITAL_COMMUNITY): Admission: EM | Admit: 2008-03-09 | Discharge: 2008-03-09 | Payer: Self-pay | Admitting: Emergency Medicine

## 2008-03-09 ENCOUNTER — Telehealth (INDEPENDENT_AMBULATORY_CARE_PROVIDER_SITE_OTHER): Payer: Self-pay | Admitting: Internal Medicine

## 2008-03-13 ENCOUNTER — Emergency Department (HOSPITAL_COMMUNITY): Admission: EM | Admit: 2008-03-13 | Discharge: 2008-03-13 | Payer: Self-pay | Admitting: Emergency Medicine

## 2008-03-13 ENCOUNTER — Encounter (INDEPENDENT_AMBULATORY_CARE_PROVIDER_SITE_OTHER): Payer: Self-pay | Admitting: Internal Medicine

## 2008-03-13 ENCOUNTER — Ambulatory Visit: Payer: Self-pay | Admitting: Internal Medicine

## 2008-03-20 ENCOUNTER — Emergency Department (HOSPITAL_COMMUNITY): Admission: EM | Admit: 2008-03-20 | Discharge: 2008-03-20 | Payer: Self-pay | Admitting: Emergency Medicine

## 2008-03-21 ENCOUNTER — Ambulatory Visit (HOSPITAL_BASED_OUTPATIENT_CLINIC_OR_DEPARTMENT_OTHER): Admission: RE | Admit: 2008-03-21 | Discharge: 2008-03-21 | Payer: Self-pay | Admitting: Internal Medicine

## 2008-03-21 ENCOUNTER — Encounter (INDEPENDENT_AMBULATORY_CARE_PROVIDER_SITE_OTHER): Payer: Self-pay | Admitting: Internal Medicine

## 2008-03-22 ENCOUNTER — Inpatient Hospital Stay (HOSPITAL_COMMUNITY): Admission: AD | Admit: 2008-03-22 | Discharge: 2008-03-25 | Payer: Self-pay | Admitting: Internal Medicine

## 2008-03-22 ENCOUNTER — Ambulatory Visit: Payer: Self-pay | Admitting: Internal Medicine

## 2008-03-22 ENCOUNTER — Emergency Department (HOSPITAL_COMMUNITY): Admission: EM | Admit: 2008-03-22 | Discharge: 2008-03-22 | Payer: Self-pay | Admitting: Emergency Medicine

## 2008-03-22 ENCOUNTER — Encounter: Payer: Self-pay | Admitting: *Deleted

## 2008-03-25 ENCOUNTER — Encounter: Payer: Self-pay | Admitting: Internal Medicine

## 2008-03-25 ENCOUNTER — Ambulatory Visit: Payer: Self-pay | Admitting: Internal Medicine

## 2008-04-01 ENCOUNTER — Emergency Department (HOSPITAL_COMMUNITY): Admission: EM | Admit: 2008-04-01 | Discharge: 2008-04-01 | Payer: Self-pay | Admitting: Emergency Medicine

## 2008-04-03 ENCOUNTER — Emergency Department (HOSPITAL_COMMUNITY): Admission: EM | Admit: 2008-04-03 | Discharge: 2008-04-03 | Payer: Self-pay | Admitting: Emergency Medicine

## 2008-04-06 DIAGNOSIS — G4733 Obstructive sleep apnea (adult) (pediatric): Secondary | ICD-10-CM

## 2008-04-07 ENCOUNTER — Observation Stay (HOSPITAL_COMMUNITY): Admission: AD | Admit: 2008-04-07 | Discharge: 2008-04-08 | Payer: Self-pay | Admitting: *Deleted

## 2008-04-07 ENCOUNTER — Encounter: Payer: Self-pay | Admitting: Emergency Medicine

## 2008-04-14 ENCOUNTER — Encounter (INDEPENDENT_AMBULATORY_CARE_PROVIDER_SITE_OTHER): Payer: Self-pay | Admitting: Internal Medicine

## 2008-04-14 ENCOUNTER — Telehealth: Payer: Self-pay | Admitting: Internal Medicine

## 2008-04-14 ENCOUNTER — Ambulatory Visit: Payer: Self-pay | Admitting: Internal Medicine

## 2008-04-16 ENCOUNTER — Emergency Department (HOSPITAL_COMMUNITY): Admission: EM | Admit: 2008-04-16 | Discharge: 2008-04-16 | Payer: Self-pay | Admitting: Emergency Medicine

## 2008-04-20 ENCOUNTER — Emergency Department (HOSPITAL_COMMUNITY): Admission: EM | Admit: 2008-04-20 | Discharge: 2008-04-20 | Payer: Self-pay | Admitting: Emergency Medicine

## 2008-04-26 ENCOUNTER — Emergency Department (HOSPITAL_COMMUNITY): Admission: EM | Admit: 2008-04-26 | Discharge: 2008-04-26 | Payer: Self-pay | Admitting: Emergency Medicine

## 2008-04-27 ENCOUNTER — Emergency Department (HOSPITAL_COMMUNITY): Admission: EM | Admit: 2008-04-27 | Discharge: 2008-04-27 | Payer: Self-pay | Admitting: Emergency Medicine

## 2008-05-02 ENCOUNTER — Emergency Department (HOSPITAL_COMMUNITY): Admission: EM | Admit: 2008-05-02 | Discharge: 2008-05-02 | Payer: Self-pay | Admitting: Emergency Medicine

## 2008-05-05 ENCOUNTER — Encounter: Payer: Self-pay | Admitting: Licensed Clinical Social Worker

## 2008-05-09 ENCOUNTER — Emergency Department (HOSPITAL_COMMUNITY): Admission: EM | Admit: 2008-05-09 | Discharge: 2008-05-09 | Payer: Self-pay | Admitting: Emergency Medicine

## 2008-05-09 ENCOUNTER — Telehealth: Payer: Self-pay | Admitting: *Deleted

## 2008-05-10 ENCOUNTER — Encounter (INDEPENDENT_AMBULATORY_CARE_PROVIDER_SITE_OTHER): Payer: Self-pay | Admitting: Internal Medicine

## 2008-05-10 ENCOUNTER — Observation Stay (HOSPITAL_COMMUNITY): Admission: AD | Admit: 2008-05-10 | Discharge: 2008-05-11 | Payer: Self-pay | Admitting: Internal Medicine

## 2008-05-10 ENCOUNTER — Ambulatory Visit: Payer: Self-pay | Admitting: Internal Medicine

## 2008-05-11 ENCOUNTER — Encounter (INDEPENDENT_AMBULATORY_CARE_PROVIDER_SITE_OTHER): Payer: Self-pay | Admitting: Neurology

## 2008-05-17 ENCOUNTER — Emergency Department (HOSPITAL_COMMUNITY): Admission: EM | Admit: 2008-05-17 | Discharge: 2008-05-17 | Payer: Self-pay | Admitting: Emergency Medicine

## 2008-05-19 ENCOUNTER — Emergency Department (HOSPITAL_COMMUNITY): Admission: EM | Admit: 2008-05-19 | Discharge: 2008-05-19 | Payer: Self-pay | Admitting: Emergency Medicine

## 2008-05-24 ENCOUNTER — Encounter: Payer: Self-pay | Admitting: Licensed Clinical Social Worker

## 2008-05-27 ENCOUNTER — Emergency Department (HOSPITAL_COMMUNITY): Admission: EM | Admit: 2008-05-27 | Discharge: 2008-05-27 | Payer: Self-pay | Admitting: Emergency Medicine

## 2008-05-31 ENCOUNTER — Ambulatory Visit: Payer: Self-pay | Admitting: Internal Medicine

## 2008-06-07 ENCOUNTER — Telehealth: Payer: Self-pay | Admitting: *Deleted

## 2008-06-08 ENCOUNTER — Emergency Department (HOSPITAL_COMMUNITY): Admission: EM | Admit: 2008-06-08 | Discharge: 2008-06-08 | Payer: Self-pay | Admitting: Emergency Medicine

## 2008-06-29 ENCOUNTER — Ambulatory Visit: Payer: Self-pay | Admitting: Internal Medicine

## 2008-07-03 ENCOUNTER — Encounter (INDEPENDENT_AMBULATORY_CARE_PROVIDER_SITE_OTHER): Payer: Self-pay | Admitting: Internal Medicine

## 2008-07-07 ENCOUNTER — Emergency Department (HOSPITAL_COMMUNITY): Admission: EM | Admit: 2008-07-07 | Discharge: 2008-07-08 | Payer: Self-pay | Admitting: Emergency Medicine
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
## 2008-07-17 ENCOUNTER — Telehealth (INDEPENDENT_AMBULATORY_CARE_PROVIDER_SITE_OTHER): Payer: Self-pay | Admitting: Internal Medicine

## 2008-07-19 IMAGING — CR DG CHEST 2V
2 series · 2 of 2 positions shown · non-contrast
Comparison: 06/20/05

CLINICAL DATA: Asthma, wheezing.
 CHEST - 2 VIEW:

[w chest pa *]
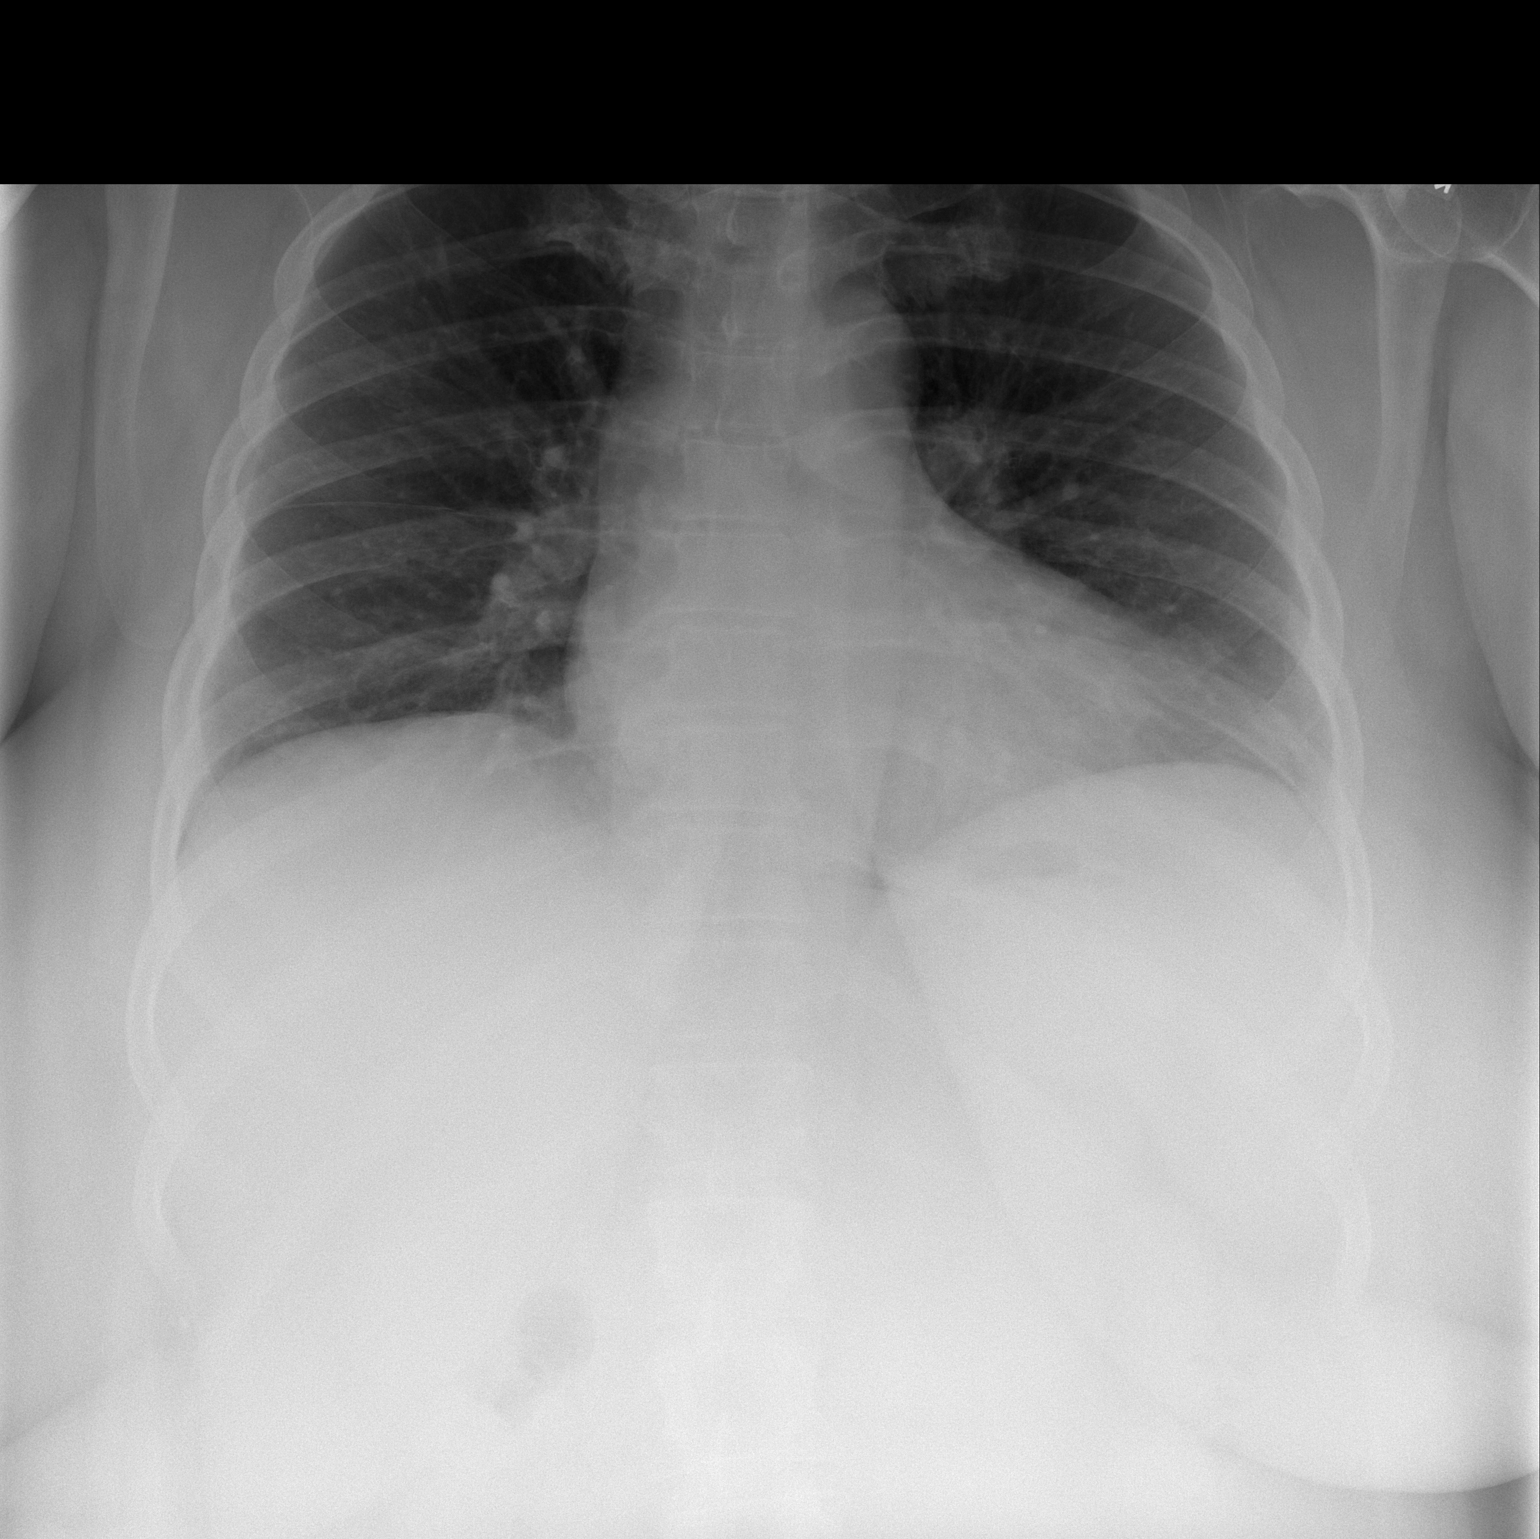

[w chest lat *]
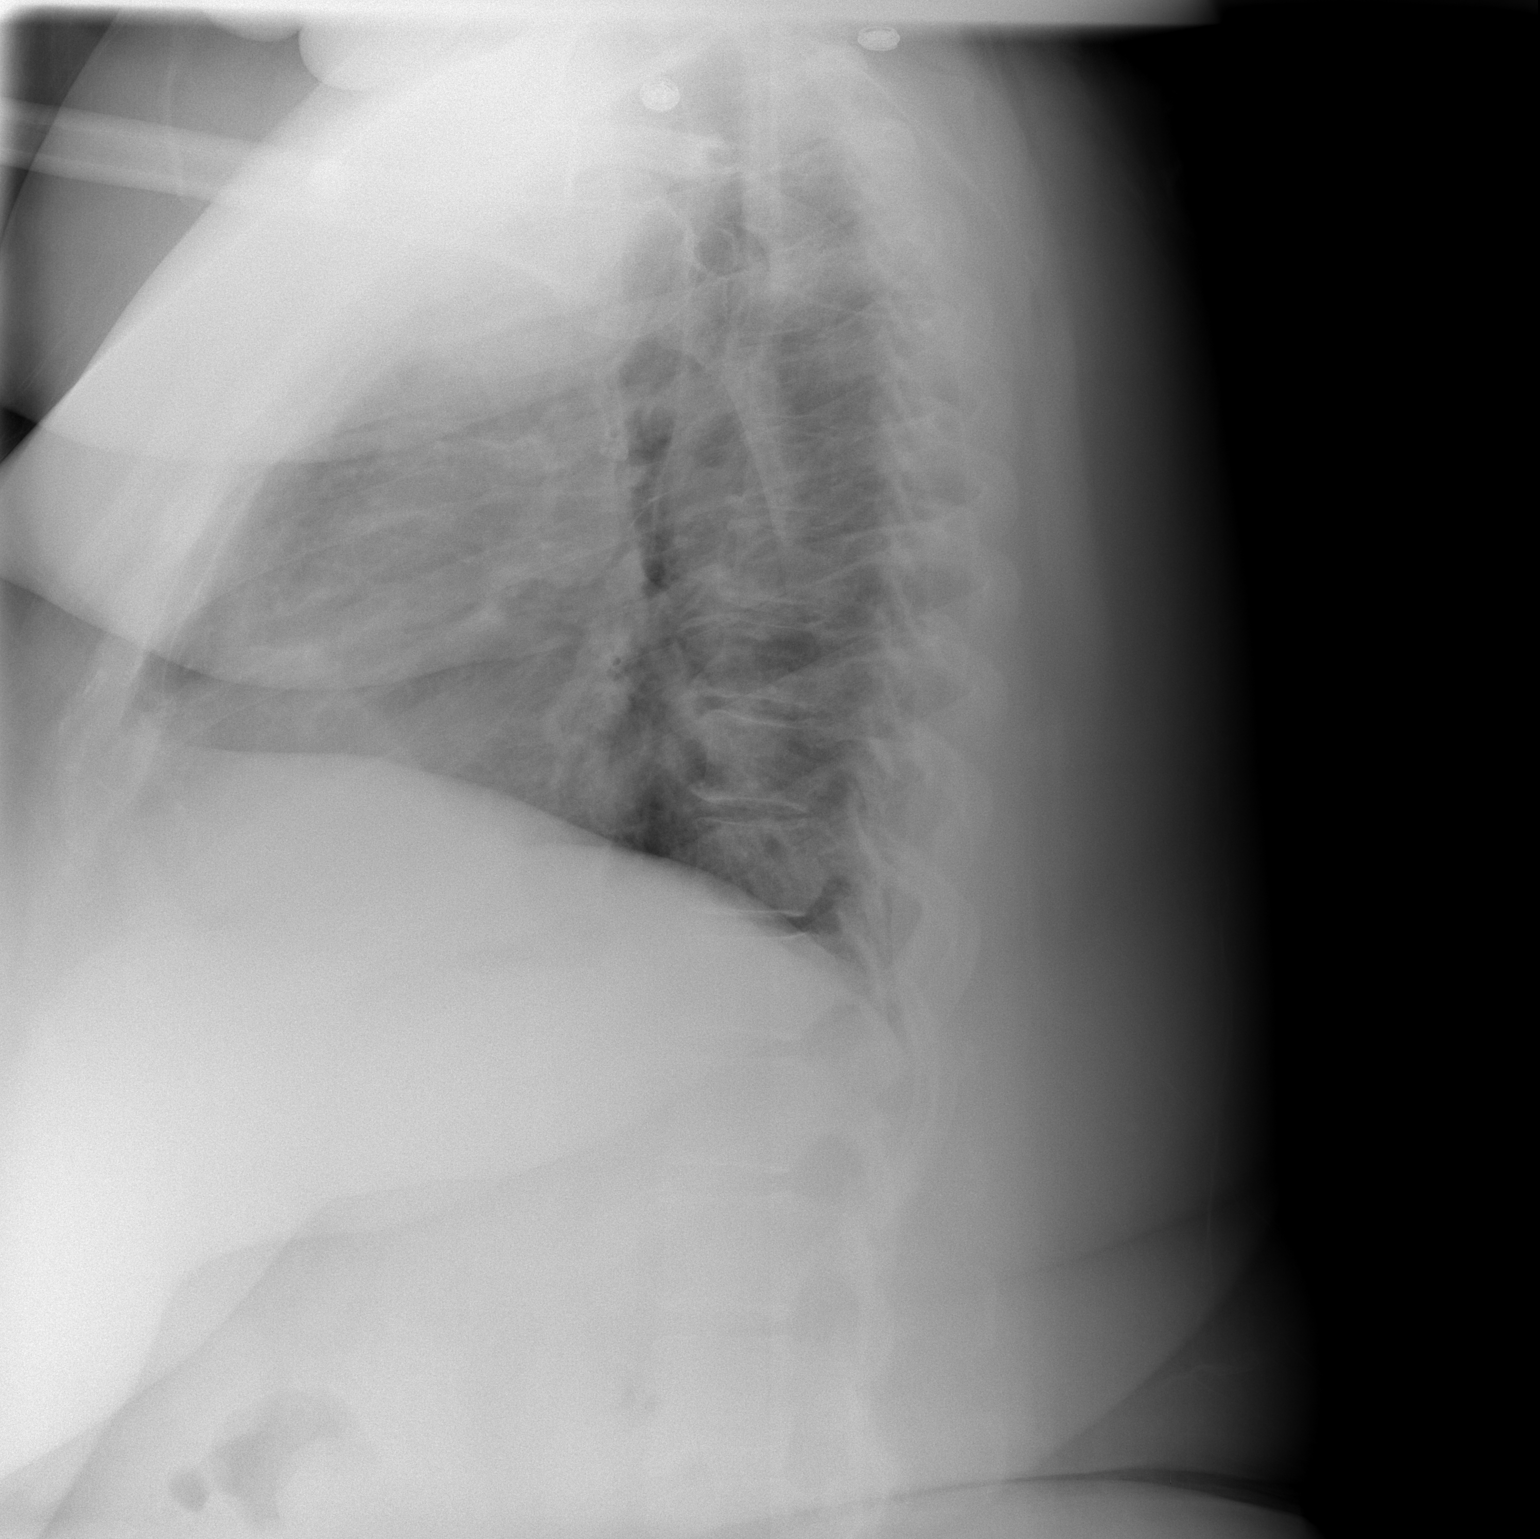

[2 of 2 positions shown; findings below may reference images not displayed]

FINDINGS: There are mildly accentuated bronchial markings, which are unchanged.  There are no infiltrates.  The heart is upper normal in size and is transverse in orientation.  The patient is filmed in a low degree of inspiration.  There is no evidence for mediastinal or hilar adenopathy.
IMPRESSION: Mild chronic bronchitic changes. Low inspiratory chest film. No acute infiltrates.

## 2008-07-26 IMAGING — CR DG CHEST 2V
2 series · 2 of 2 positions shown · non-contrast
Comparison: 12/31/05.

CLINICAL DATA: Short of breath, asthma.
 CHEST - 2 VIEW:

[w chest pa]
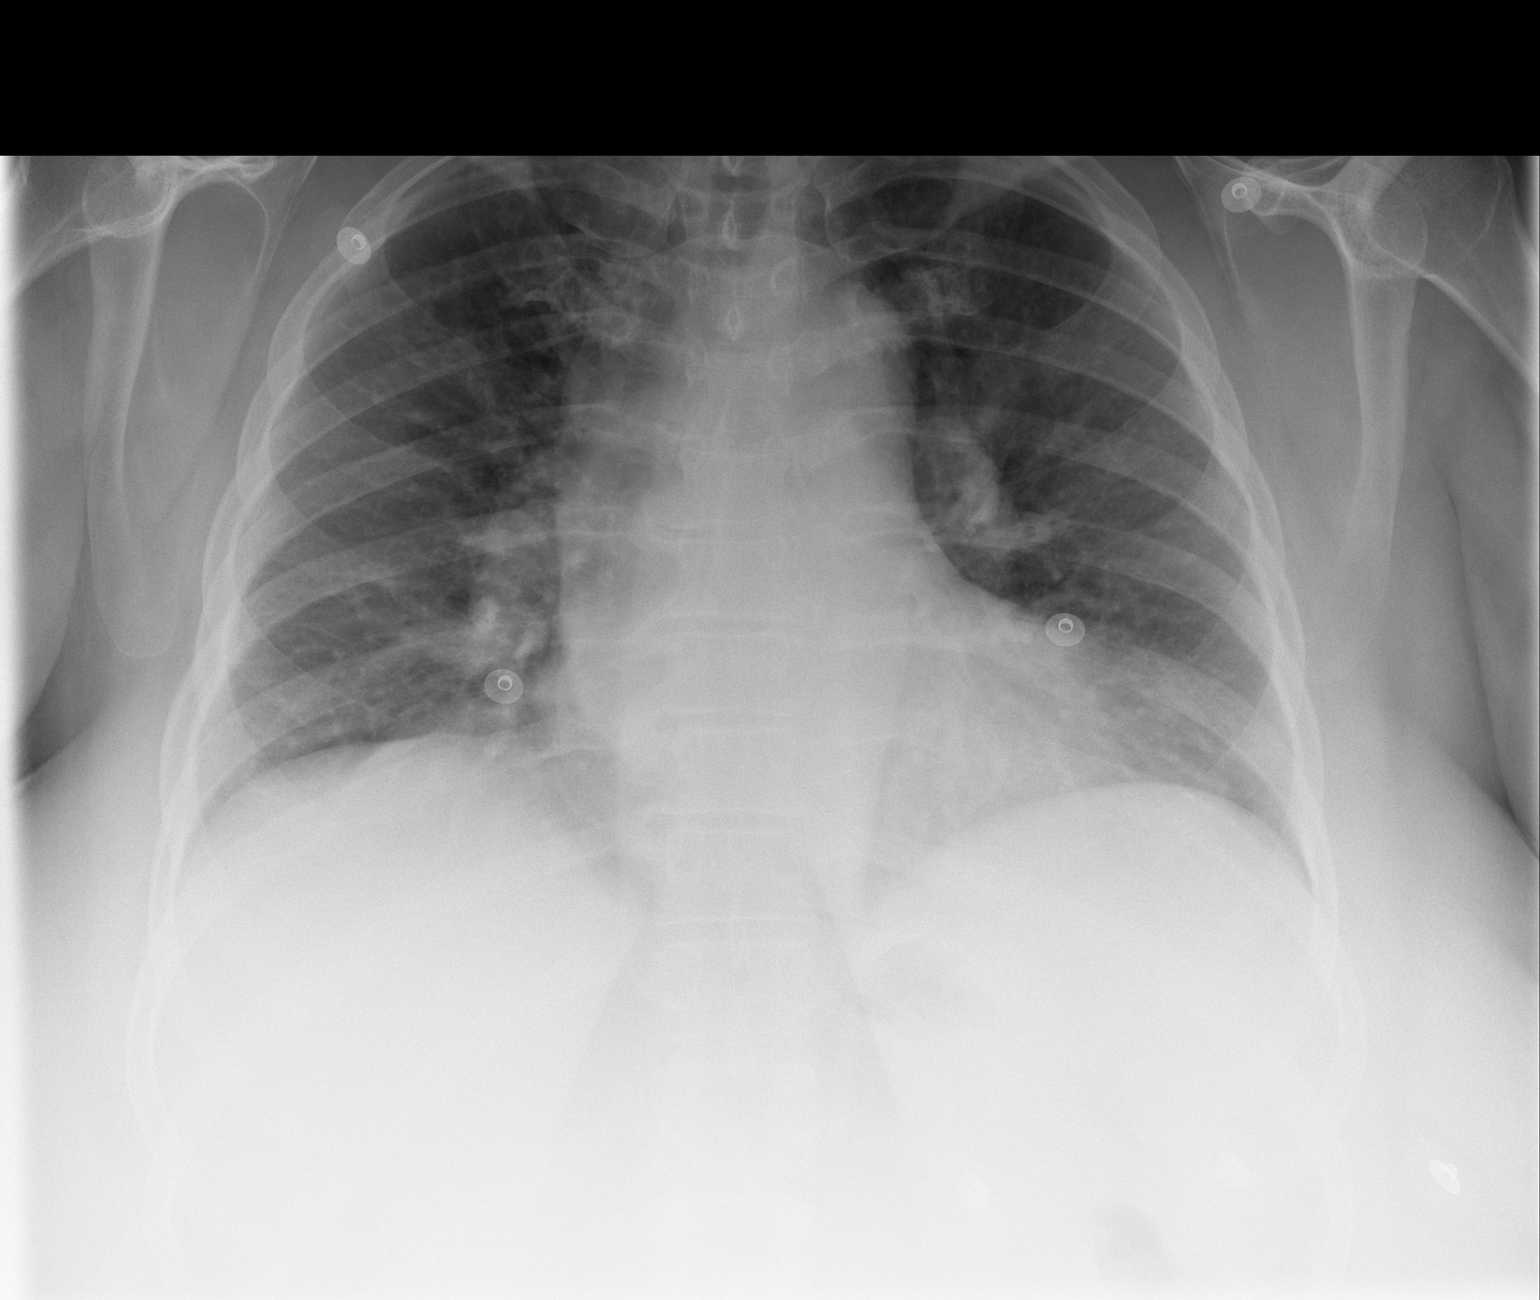

[w chest lat]
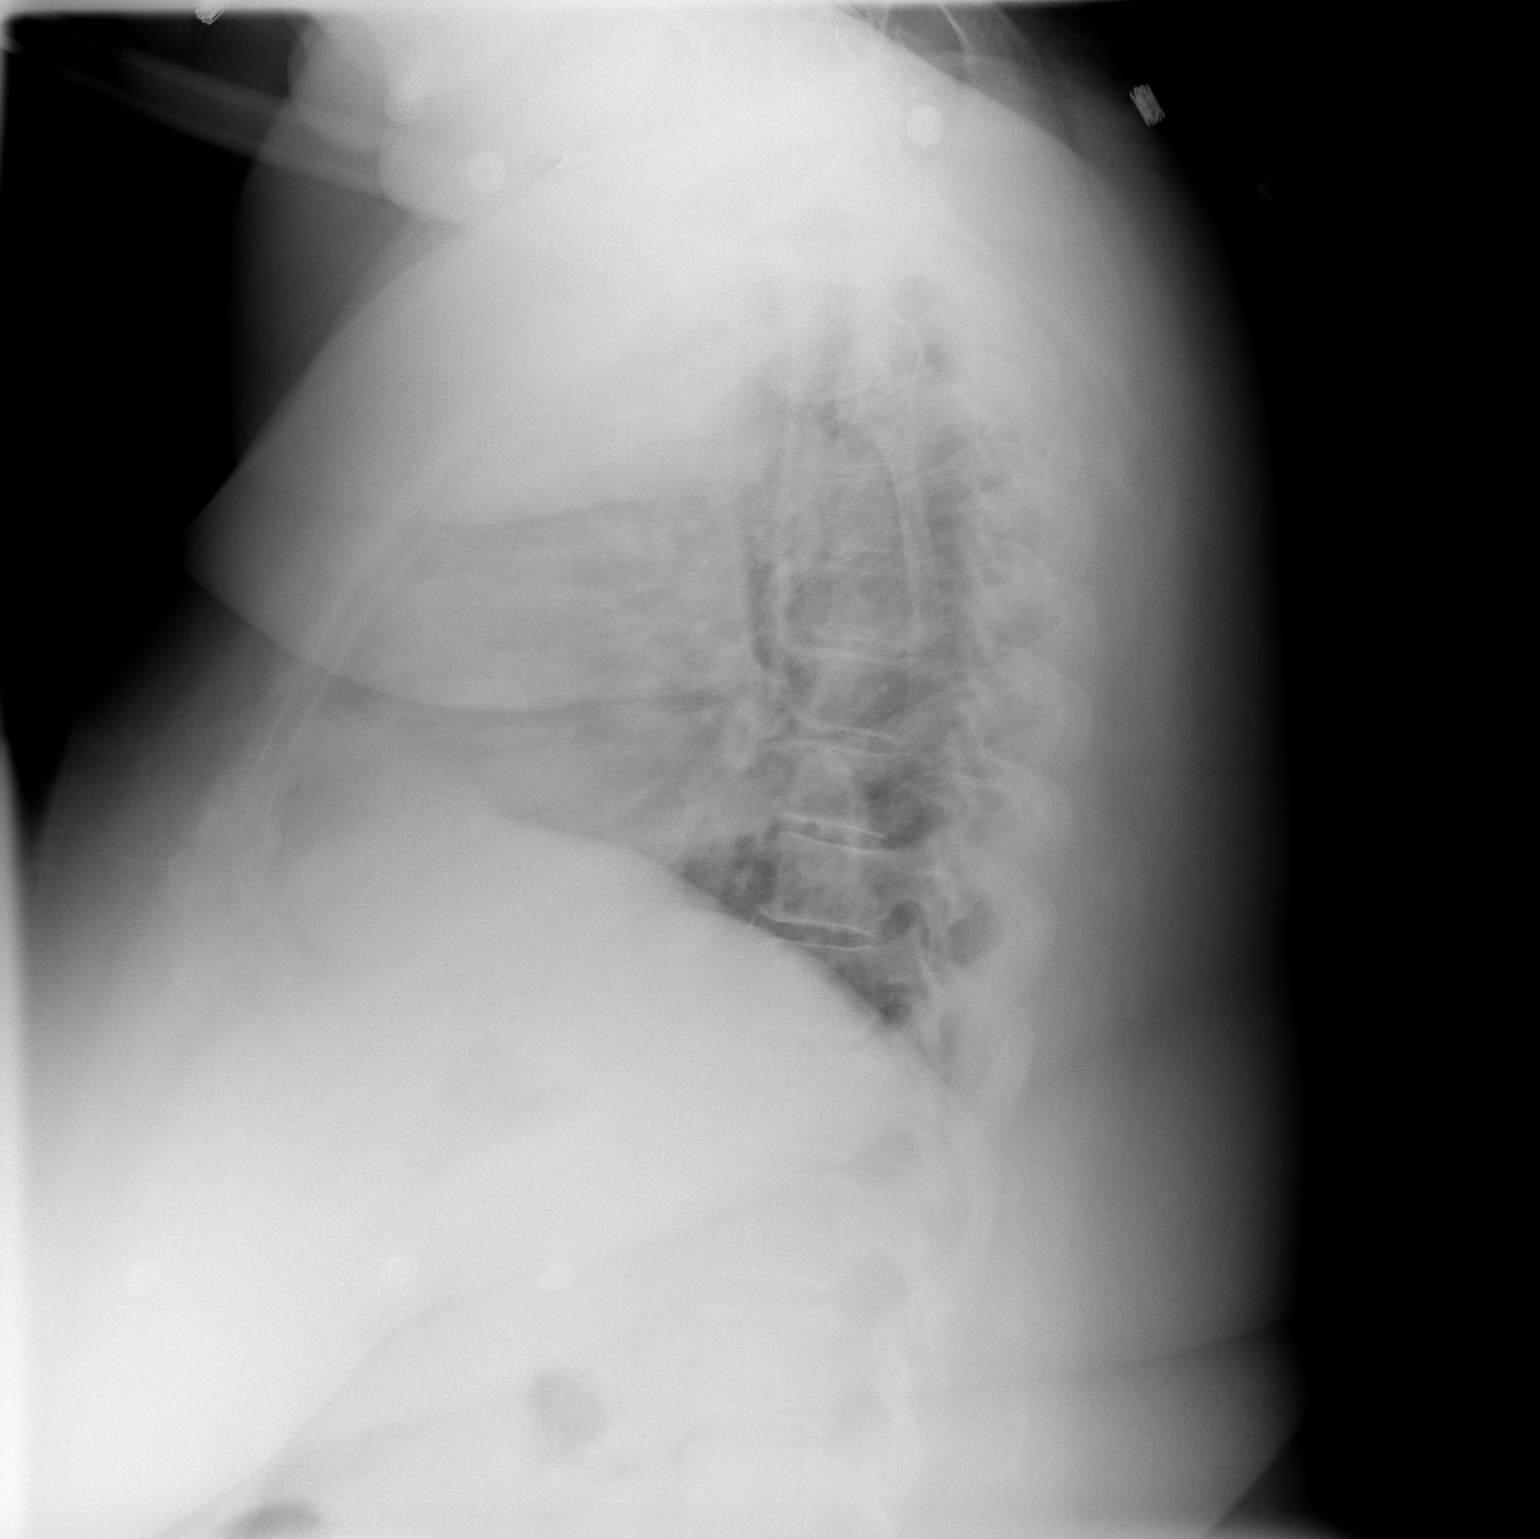

[2 of 2 positions shown; findings below may reference images not displayed]

FINDINGS: Decreased lung volume with mild bibasilar atelectasis.   No focal infiltrate or effusion.  No definite infiltrate.
IMPRESSION: Hypoventilation with bibasilar atelectasis.  There may also be some peribronchial thickening which is difficult to evaluate given the perihilar atelectasis.

## 2008-07-27 ENCOUNTER — Ambulatory Visit: Payer: Self-pay | Admitting: Internal Medicine

## 2008-07-27 ENCOUNTER — Encounter: Payer: Self-pay | Admitting: Internal Medicine

## 2008-07-27 ENCOUNTER — Other Ambulatory Visit: Admission: RE | Admit: 2008-07-27 | Discharge: 2008-07-27 | Payer: Self-pay | Admitting: Internal Medicine

## 2008-08-12 IMAGING — CR DG KNEE COMPLETE 4+V*R*
4 series · 4 of 4 positions shown · non-contrast
Comparison: None.

CLINICAL DATA: Right knee pain.
 RIGHT KNEE ? 4 VIEW:

[t knee ap right *]
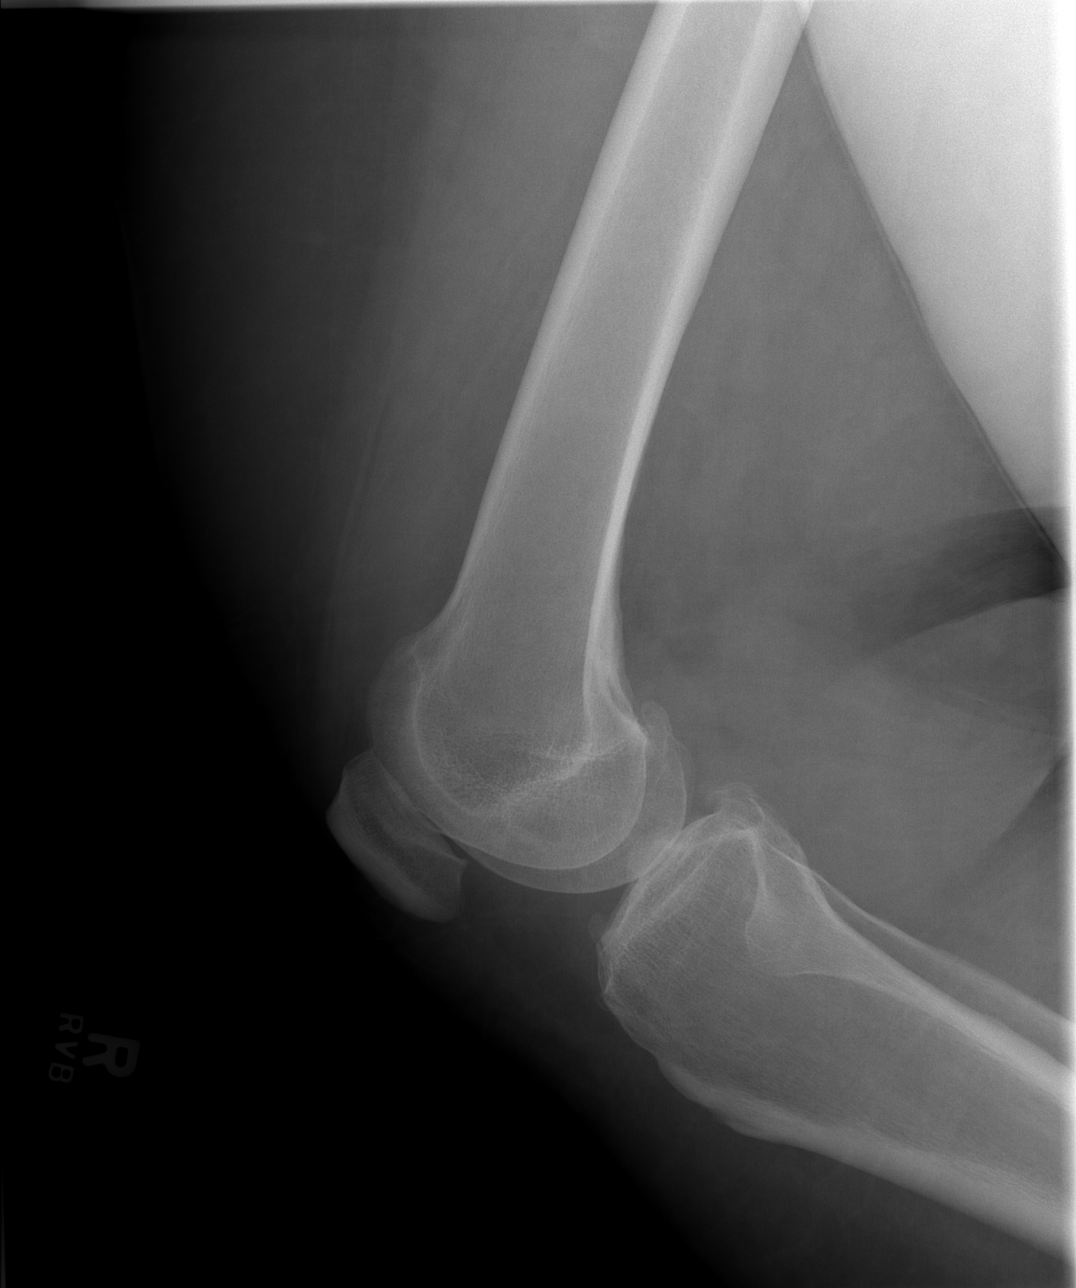

[t knee oblique right * (1 of 2)]
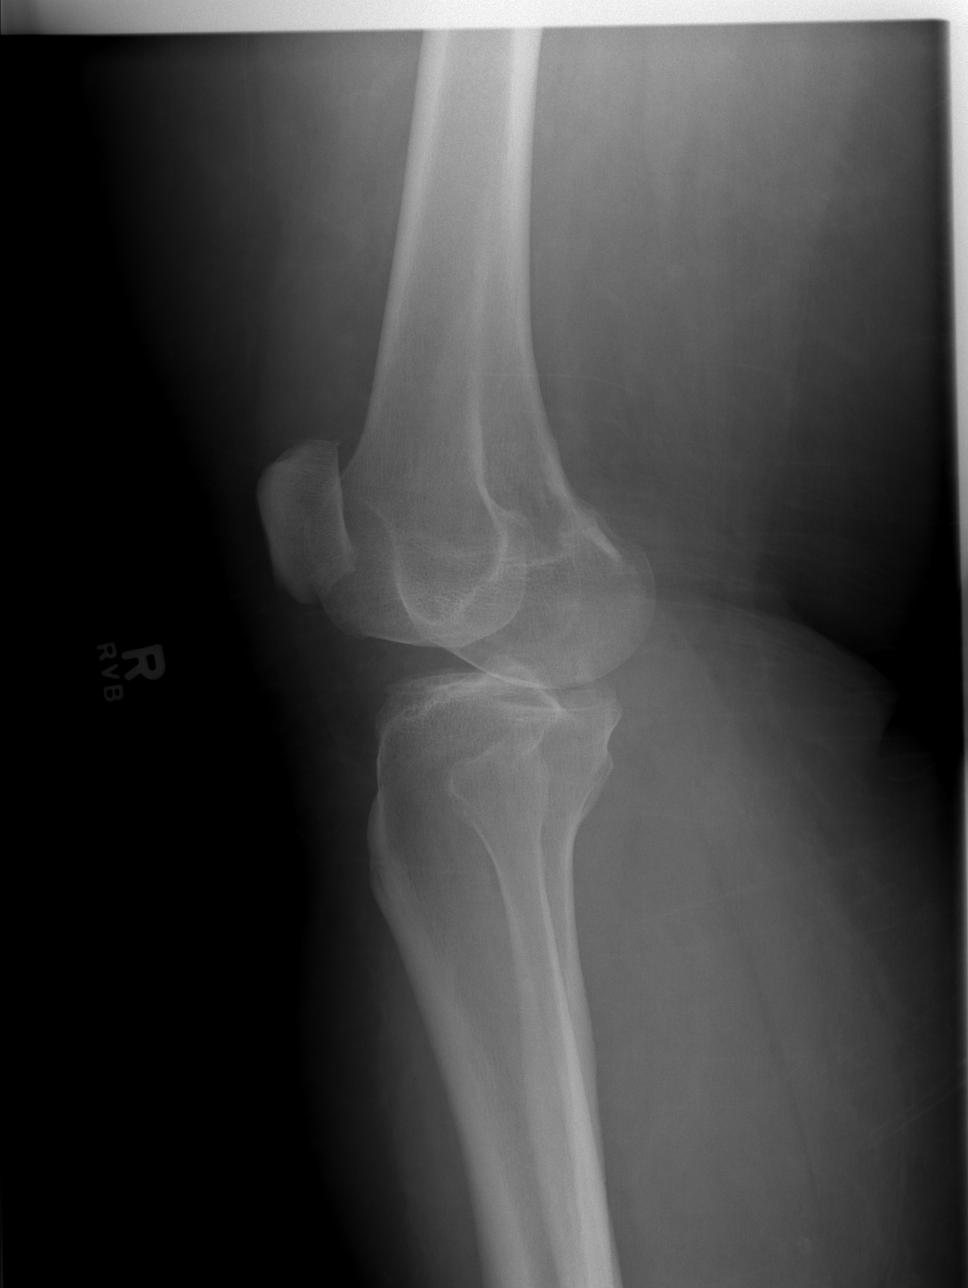

[t knee oblique right * (2 of 2)]
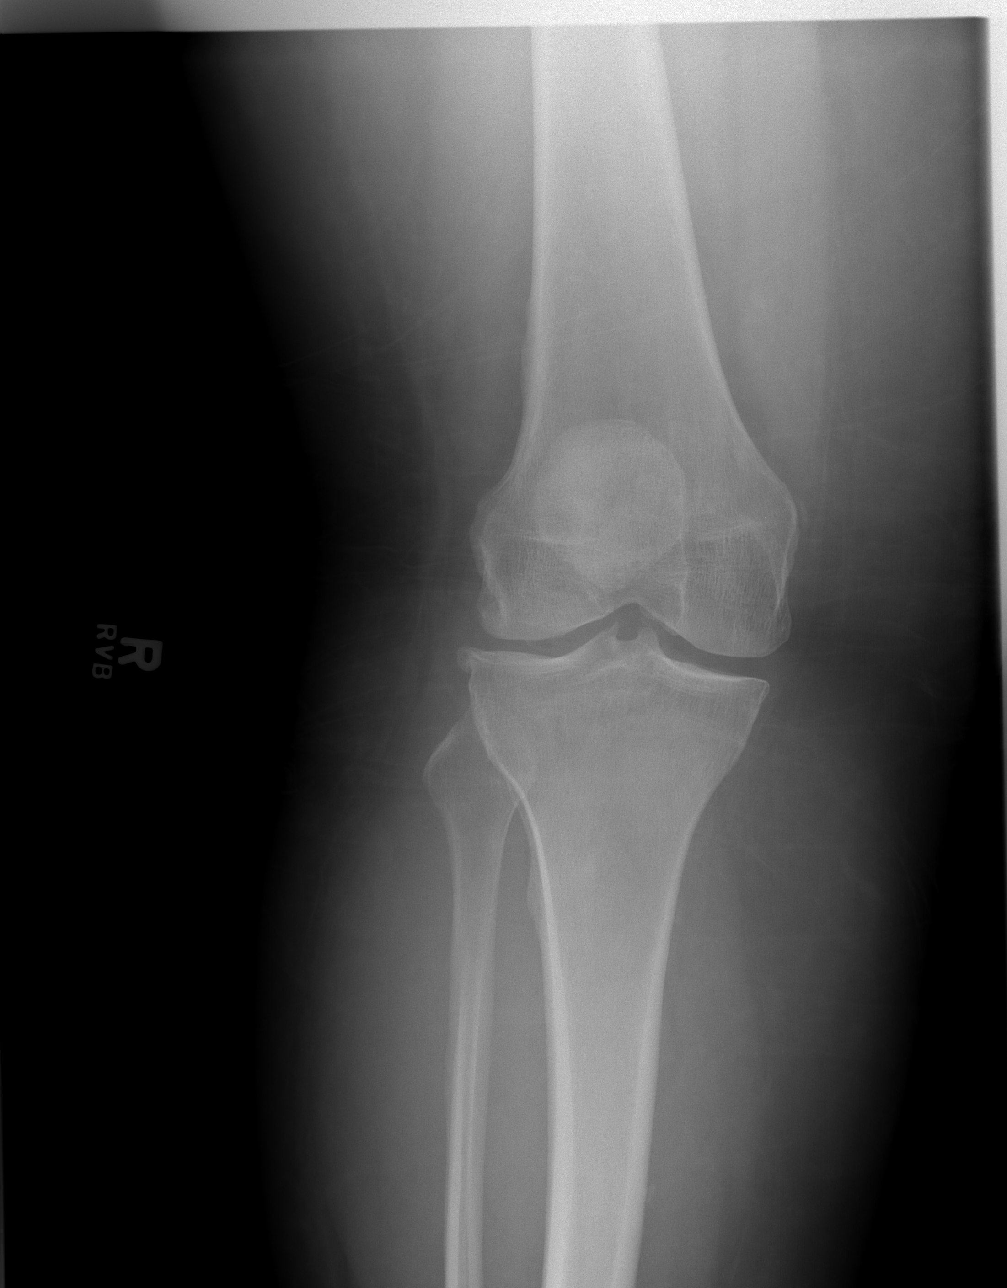

[t knee lat right *]
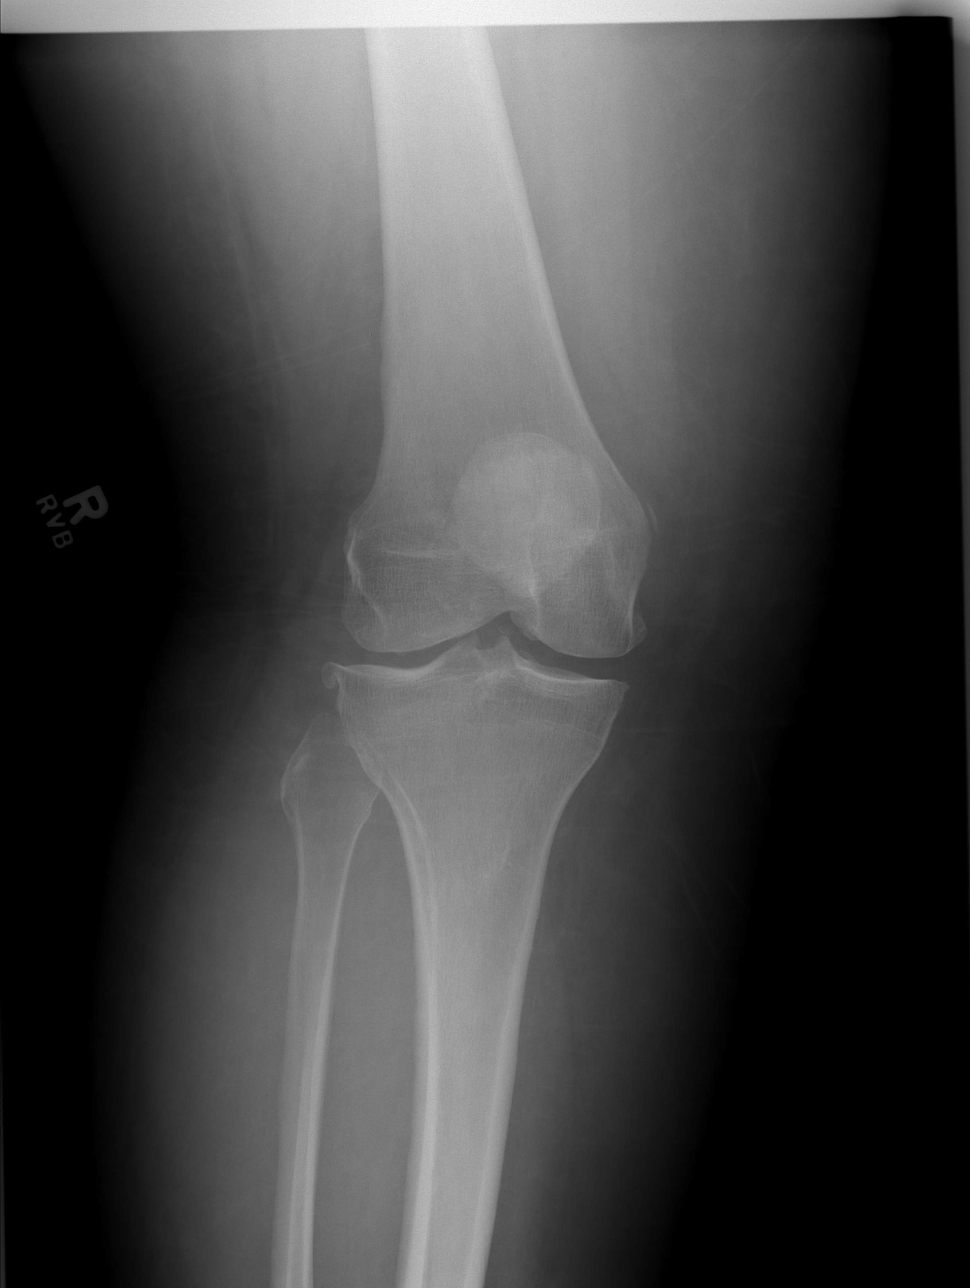

[4 of 4 positions shown; findings below may reference images not displayed]

FINDINGS: Mild tricompartmental degenerative changes are present.  There is some loss of lateral joint space height.  No fracture, dislocation, or bony lesion is seen.  No evidence of joint effusion.
IMPRESSION: Tricompartmental degenerative changes.

## 2008-08-18 ENCOUNTER — Telehealth (INDEPENDENT_AMBULATORY_CARE_PROVIDER_SITE_OTHER): Payer: Self-pay | Admitting: *Deleted

## 2008-08-28 ENCOUNTER — Emergency Department (HOSPITAL_COMMUNITY): Admission: EM | Admit: 2008-08-28 | Discharge: 2008-08-29 | Payer: Self-pay | Admitting: Emergency Medicine

## 2008-08-29 ENCOUNTER — Ambulatory Visit: Payer: Self-pay | Admitting: Internal Medicine

## 2008-08-29 ENCOUNTER — Inpatient Hospital Stay (HOSPITAL_COMMUNITY): Admission: EM | Admit: 2008-08-29 | Discharge: 2008-08-30 | Payer: Self-pay | Admitting: *Deleted

## 2008-08-29 ENCOUNTER — Ambulatory Visit: Payer: Self-pay | Admitting: *Deleted

## 2008-08-29 ENCOUNTER — Encounter (INDEPENDENT_AMBULATORY_CARE_PROVIDER_SITE_OTHER): Payer: Self-pay | Admitting: Dermatology

## 2008-08-30 ENCOUNTER — Encounter: Payer: Self-pay | Admitting: Internal Medicine

## 2008-09-05 IMAGING — CR DG KNEE COMPLETE 4+V*R*
4 series · 4 of 4 positions shown · non-contrast
Comparison: 01/24/06.

CLINICAL DATA: Knee injury with pain and swelling.  
 RIGHT KNEE ? 4 VIEW:

[t knee lat right]
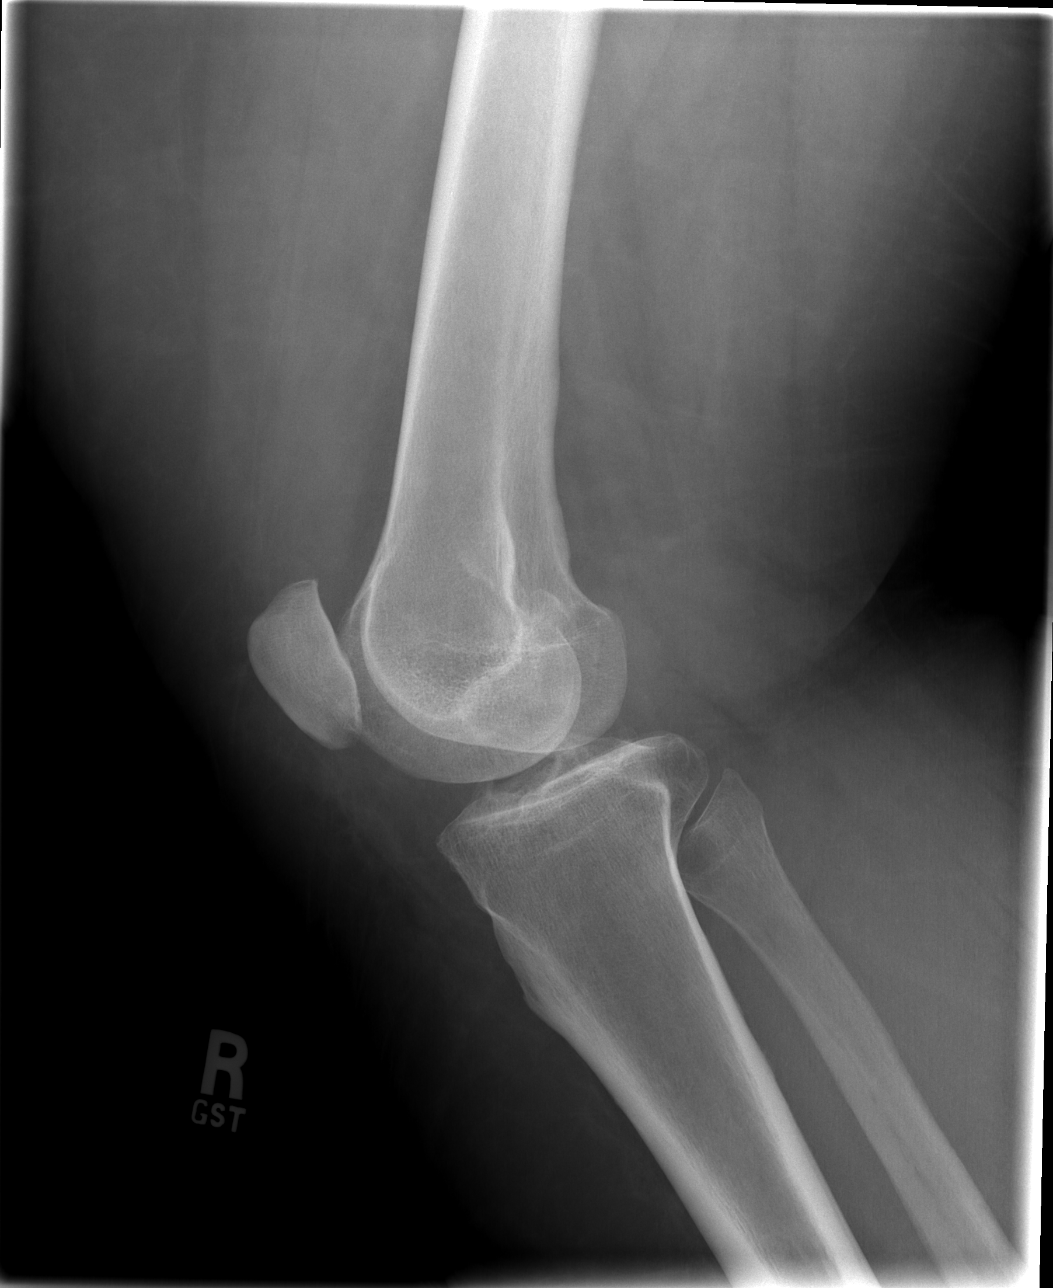

[t knee ap right]
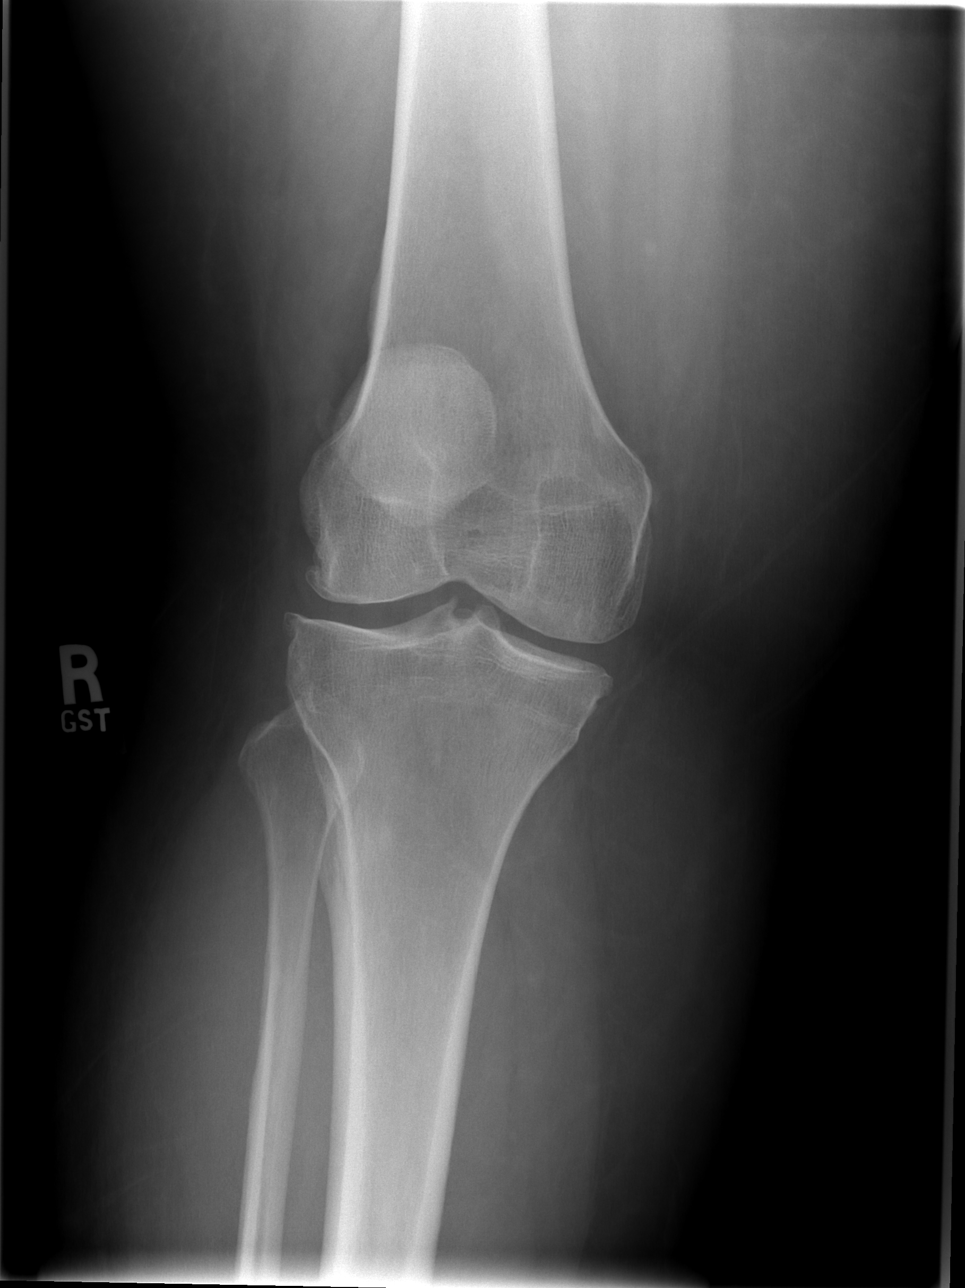

[t knee oblique right (1 of 2)]
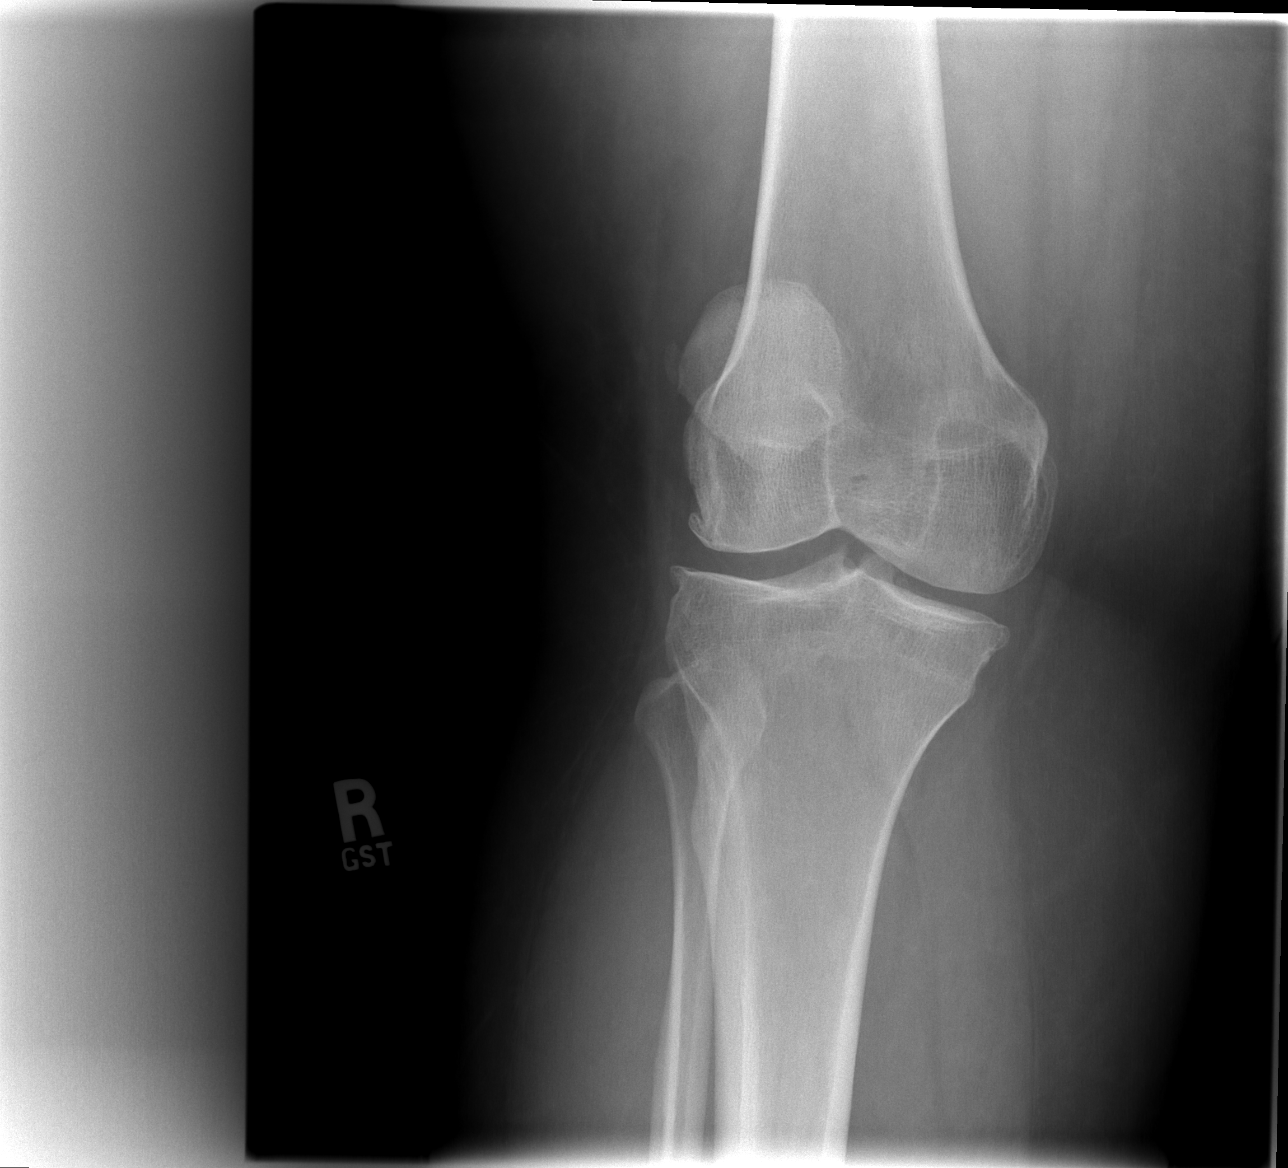

[t knee oblique right (2 of 2)]
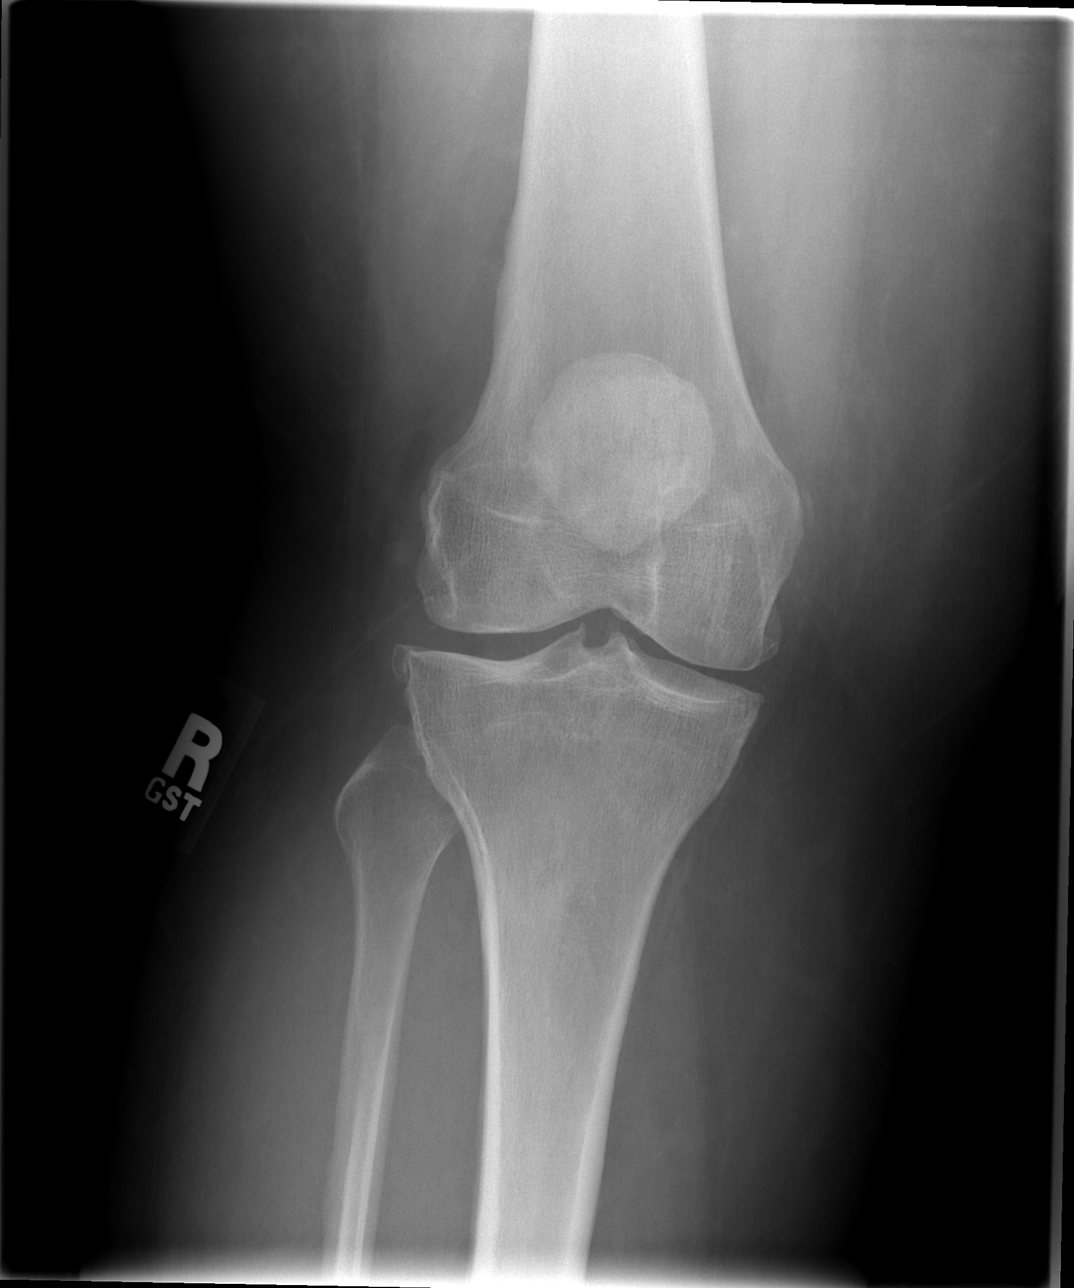

[4 of 4 positions shown; findings below may reference images not displayed]

FINDINGS: Small knee effusion is present.  No acute bony abnormalities are identified.  Moderate tricompartmental degenerative changes are identified.  Evidence of prior medial collateral ligament injury noted.
IMPRESSION: 1.  Small knee effusion without acute bony abnormality. 
 2.  Tricompartmental degenerative changes and remote medial collateral ligament injury.

## 2008-09-13 IMAGING — CR DG KNEE COMPLETE 4+V*R*
4 series · 4 of 4 positions shown · non-contrast
Comparison: 02/17/2006

CLINICAL DATA: Right shoulder and knee pain and swelling status post fall.  
 RIGHT KNEE - 4 VIEW:

[t knee ap right (1 of 2)]
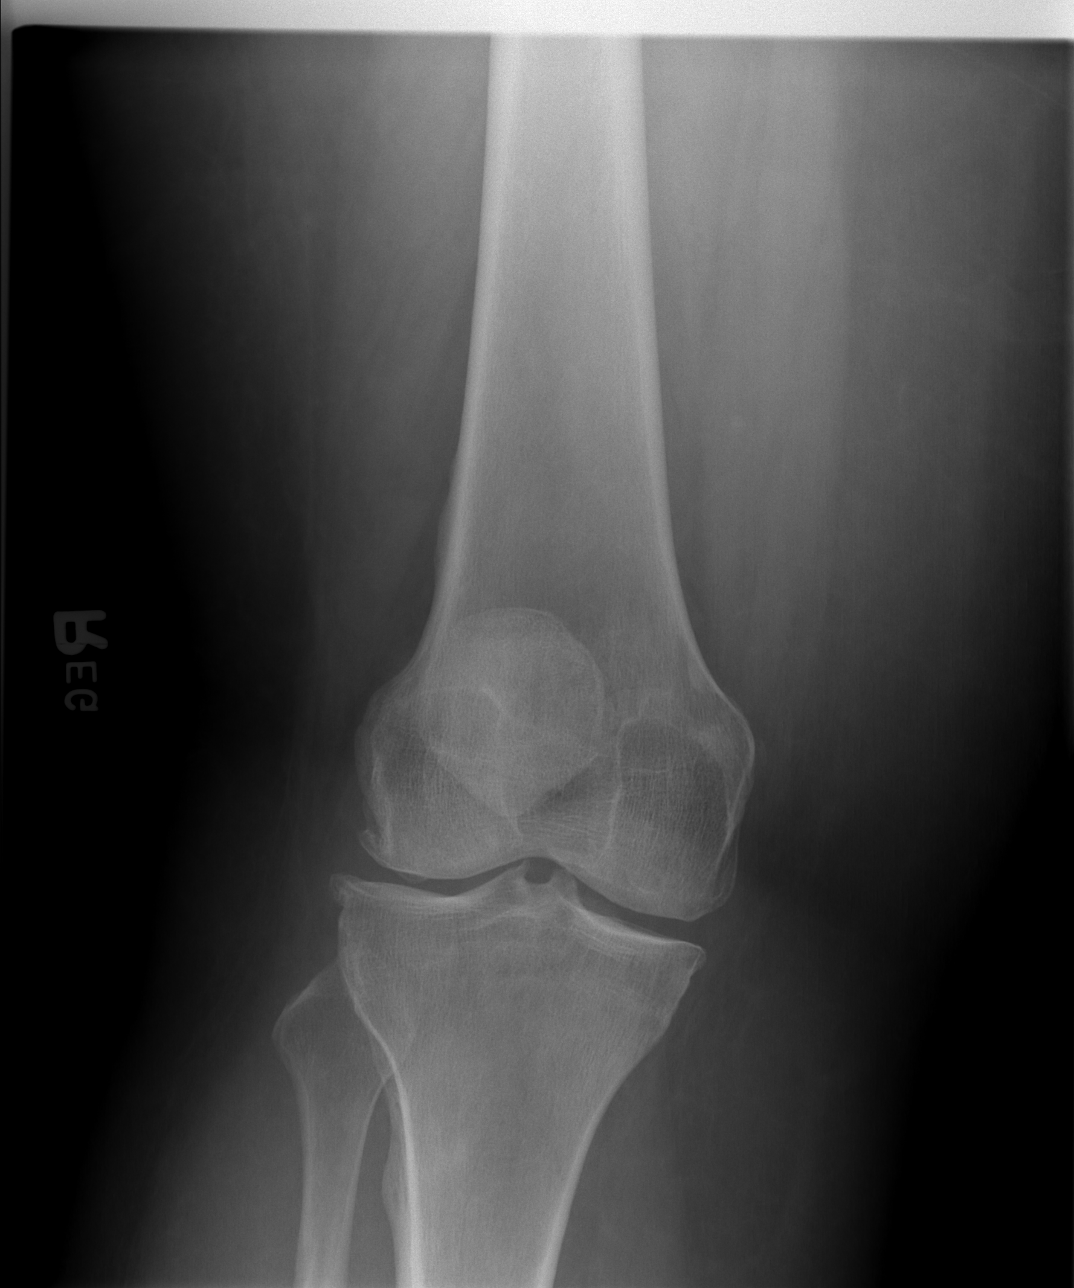

[t knee lat right (1 of 2)]
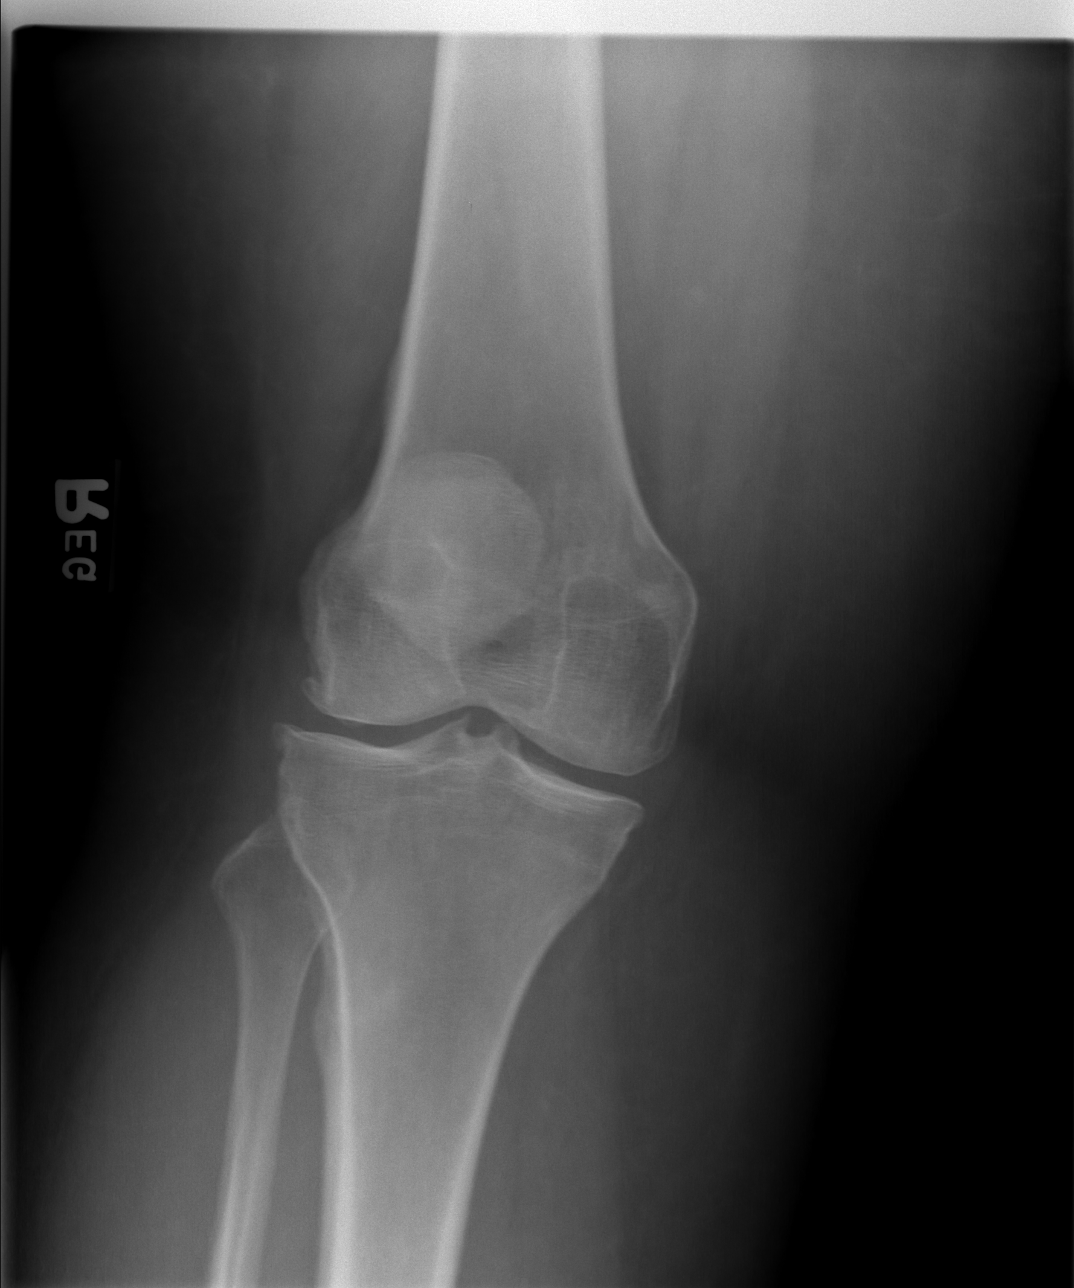

[t knee ap right (2 of 2)]
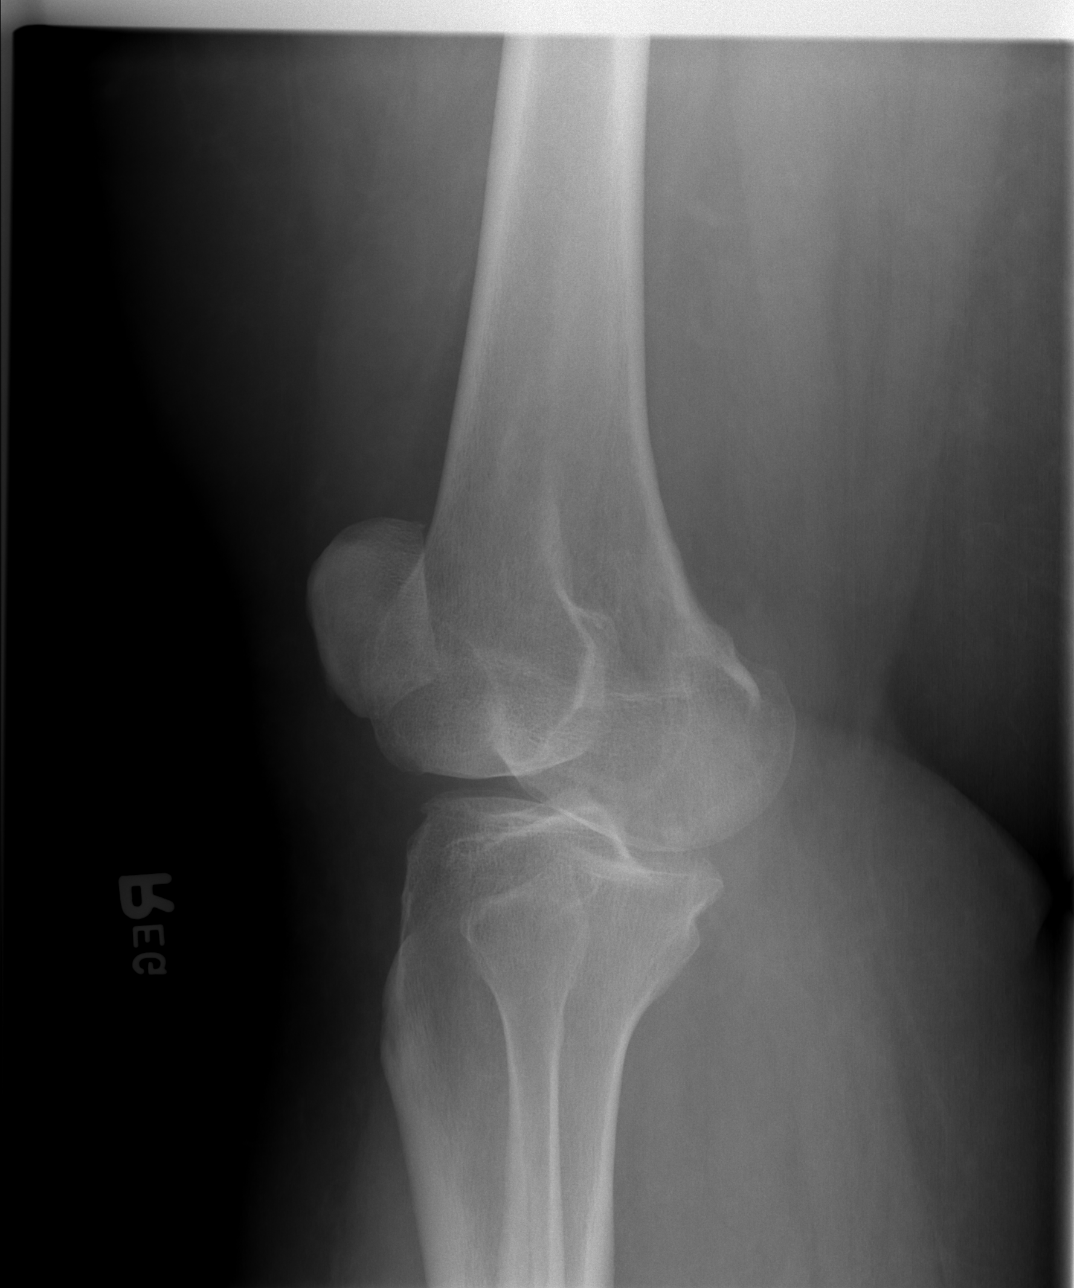

[t knee lat right (2 of 2)]
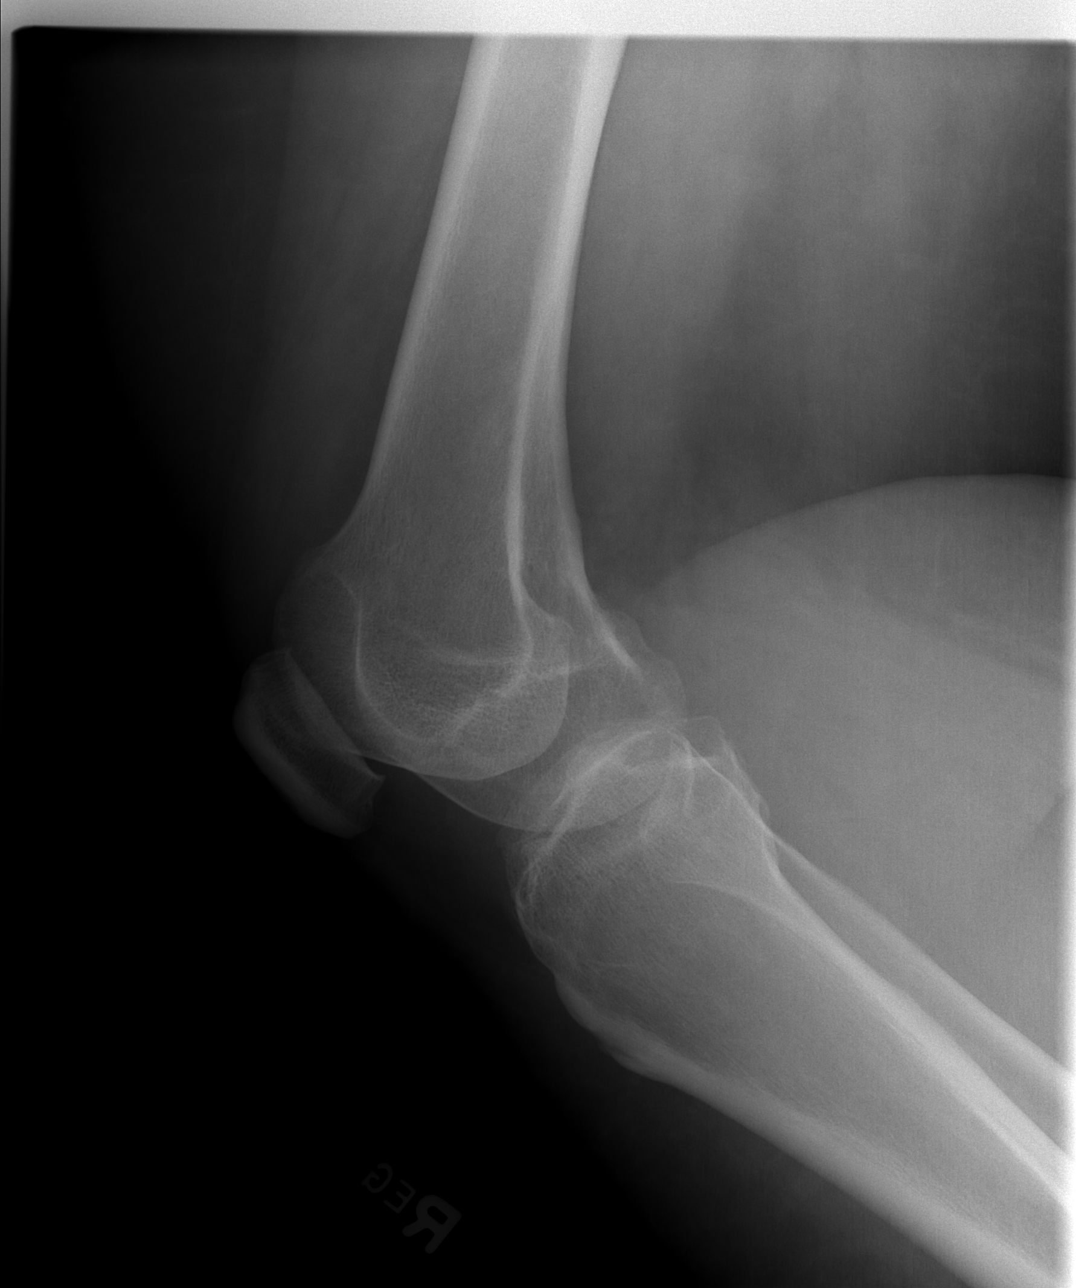

[4 of 4 positions shown; findings below may reference images not displayed]

FINDINGS: The patient is obliqued on the lateral view.  A knee joint effusion appears stable.  There is no evidence of acute fracture or dislocation.  Tricompartmental degenerative changes are again noted.
IMPRESSION: Stable examination demonstrating no acute osseous findings.  There is a small joint effusion with tricompartmental degenerative change. 
 RIGHT SHOULDER   - 3 VIEW:
FINDINGS: Negative for acute fracture or dislocation.  Moderate acromioclavicular degenerative changes are noted.  The subacromial space appears preserved.
IMPRESSION: No acute findings

## 2008-09-13 IMAGING — CR DG SHOULDER 2+V*R*
4 series · 4 of 4 positions shown · non-contrast
Comparison: 02/17/2006

CLINICAL DATA: Right shoulder and knee pain and swelling status post fall.  
 RIGHT KNEE - 4 VIEW:

[t shoulder ap internal righ (1 of 2)]
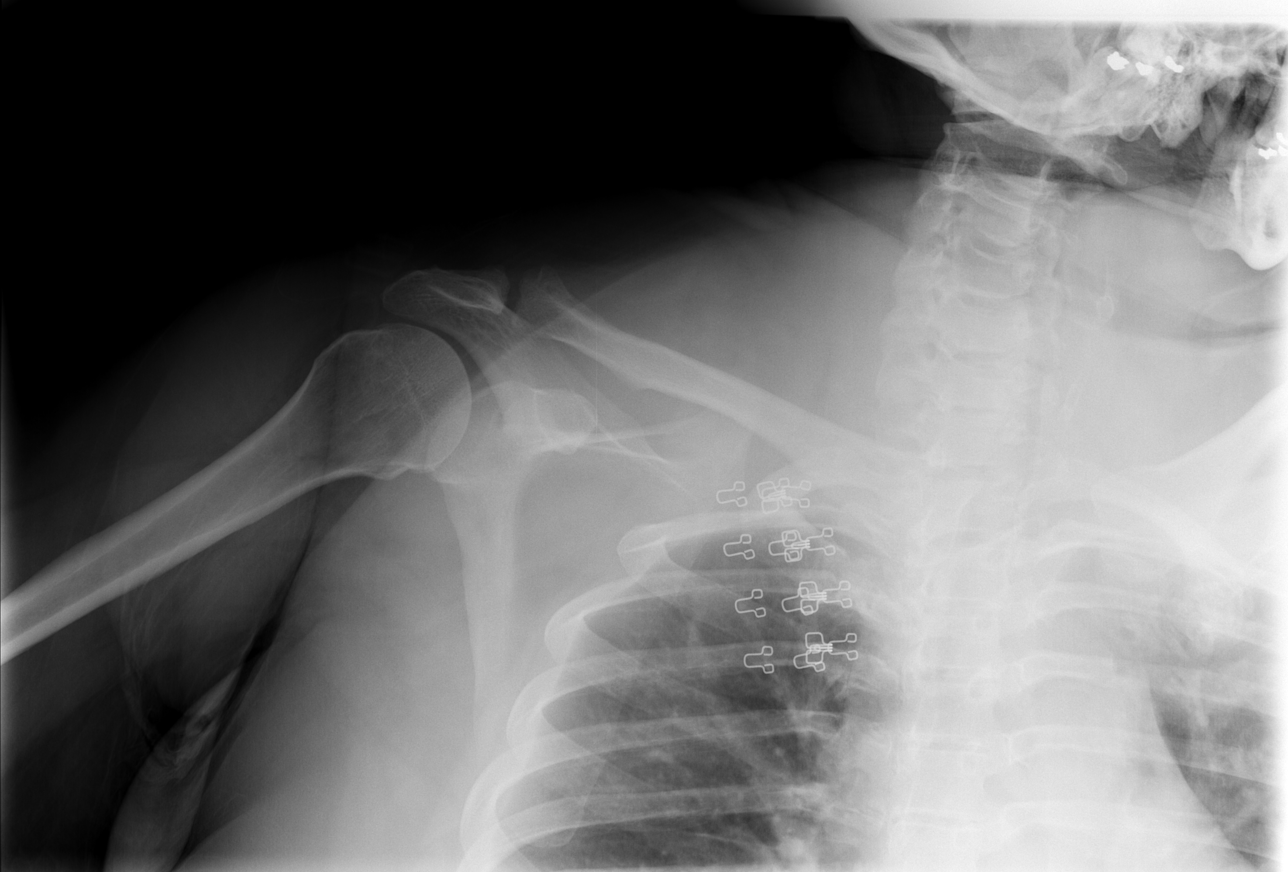

[t shoulder ap internal righ (2 of 2)]
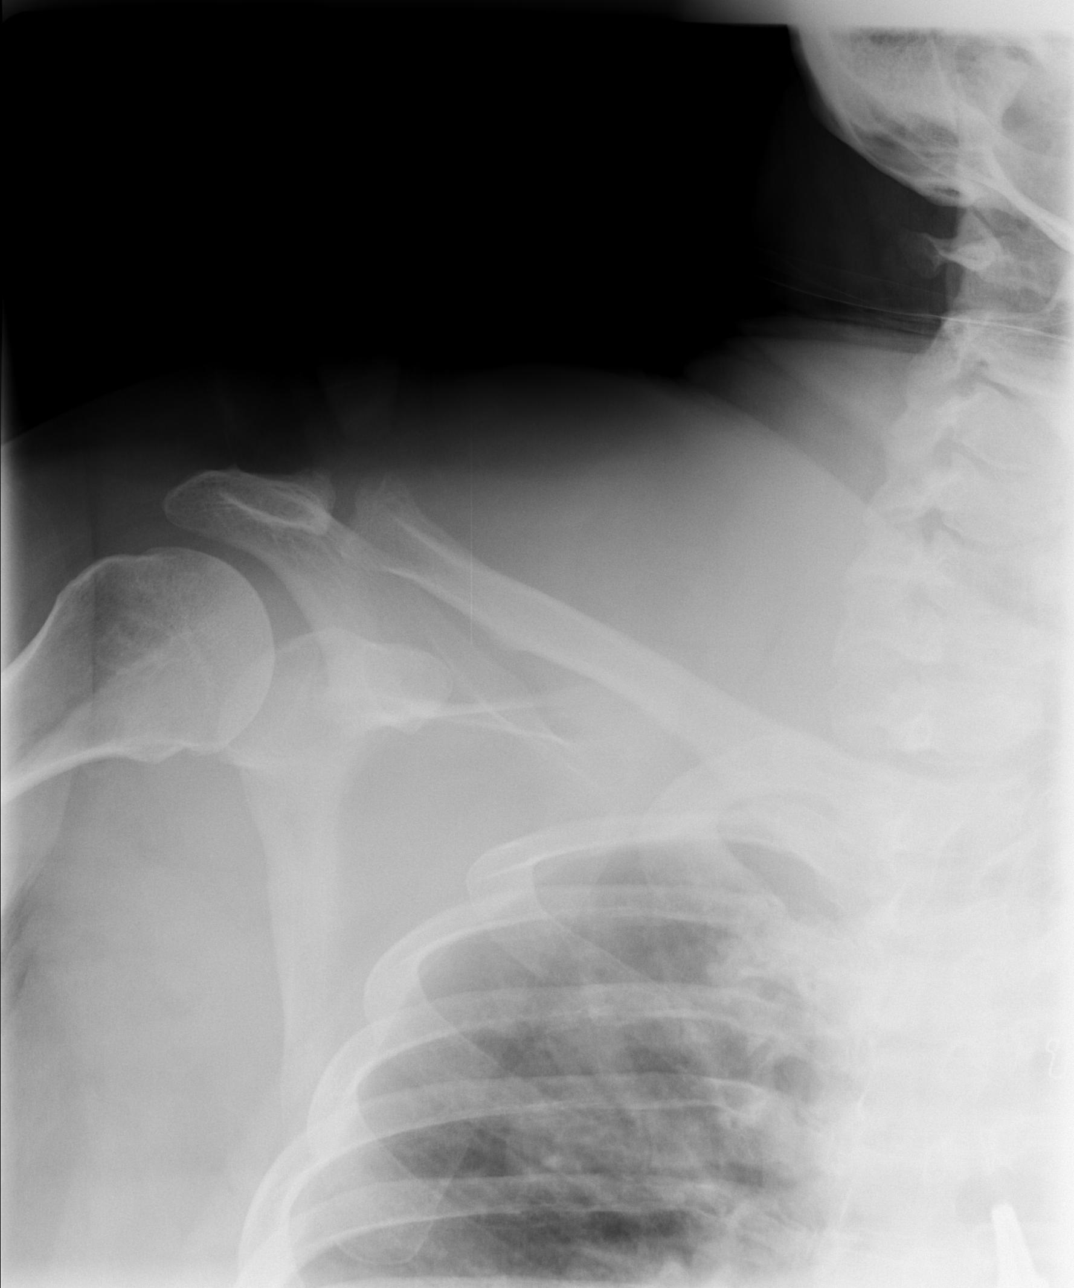

[t shoulder ap external righ]
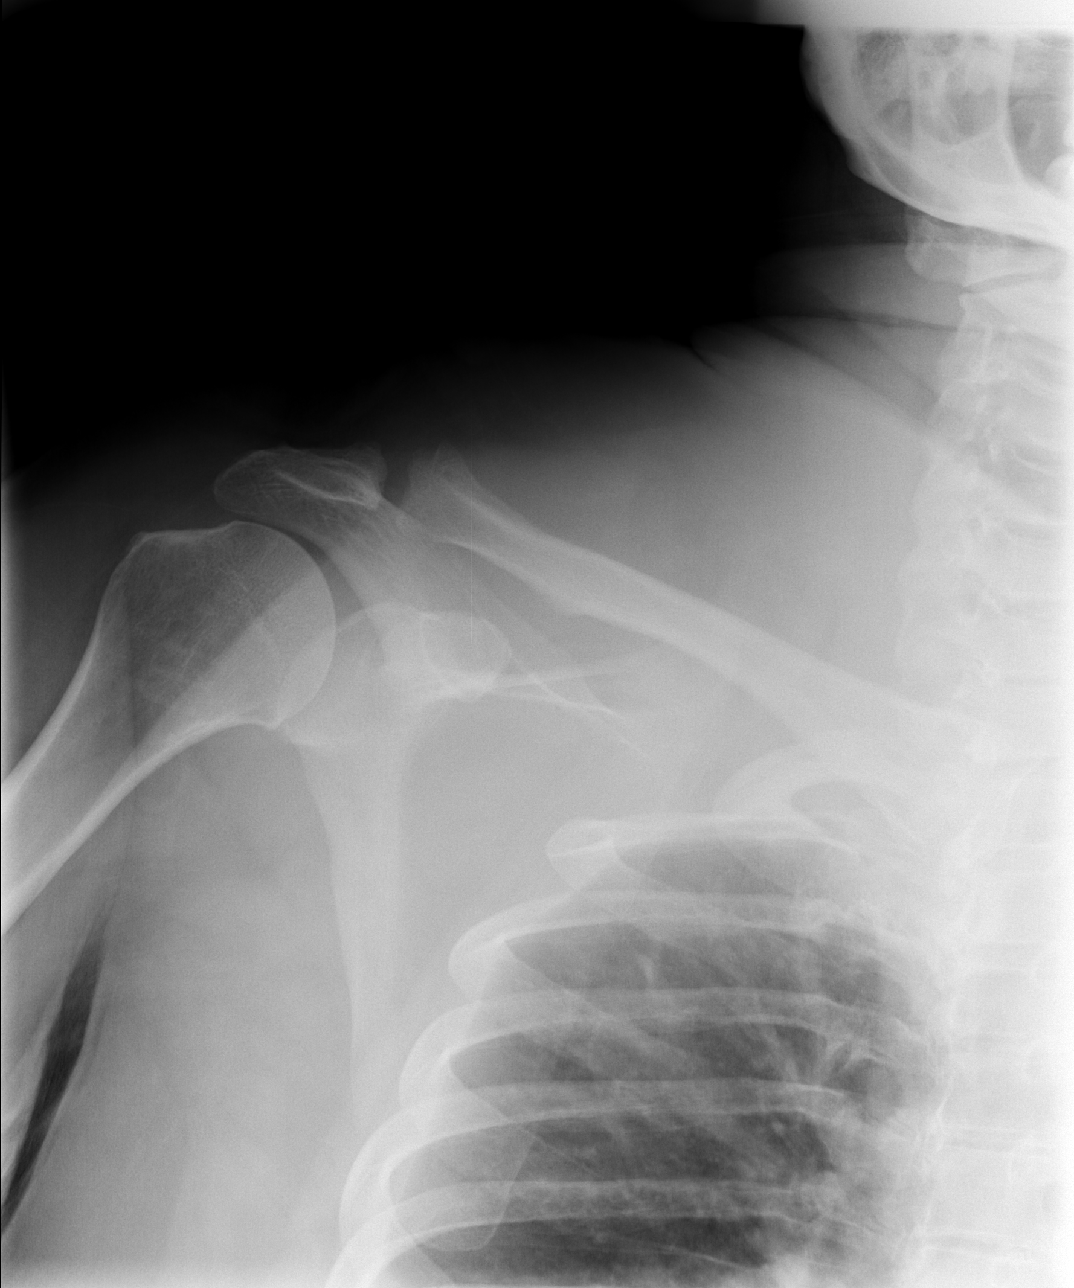

[t shoulder y view right]
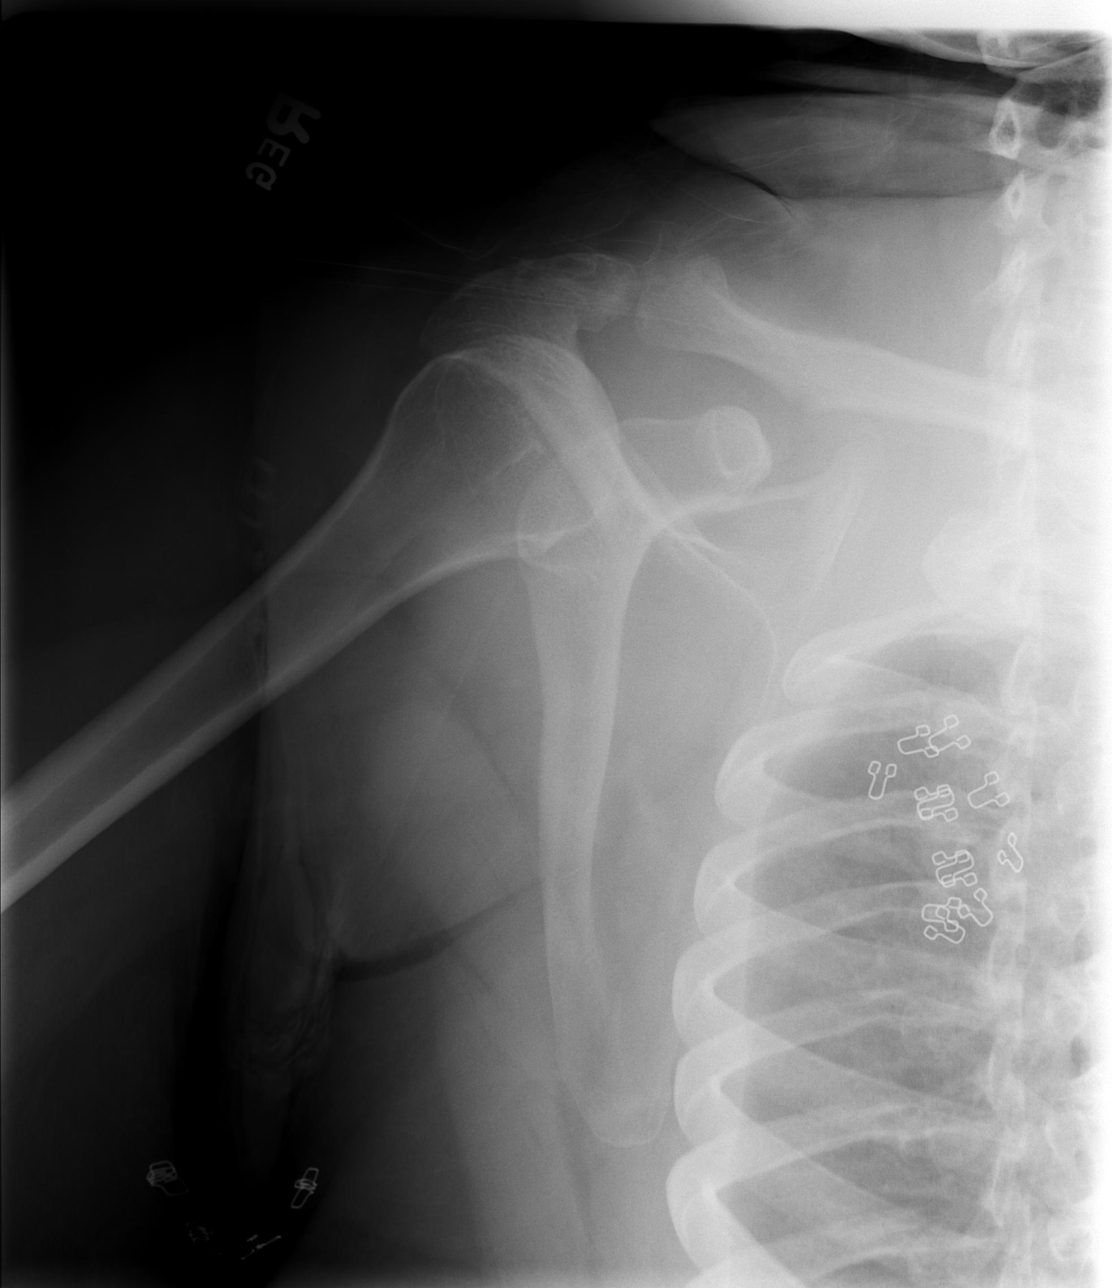

[4 of 4 positions shown; findings below may reference images not displayed]

FINDINGS: The patient is obliqued on the lateral view.  A knee joint effusion appears stable.  There is no evidence of acute fracture or dislocation.  Tricompartmental degenerative changes are again noted.
IMPRESSION: Stable examination demonstrating no acute osseous findings.  There is a small joint effusion with tricompartmental degenerative change. 
 RIGHT SHOULDER   - 3 VIEW:
FINDINGS: Negative for acute fracture or dislocation.  Moderate acromioclavicular degenerative changes are noted.  The subacromial space appears preserved.
IMPRESSION: No acute findings

## 2008-09-20 IMAGING — CR DG CHEST 2V
2 series · 2 of 2 positions shown · non-contrast
Comparison: none

CLINICAL DATA: Fall.
 CHEST - 2 VIEW:

[w chest pa *]
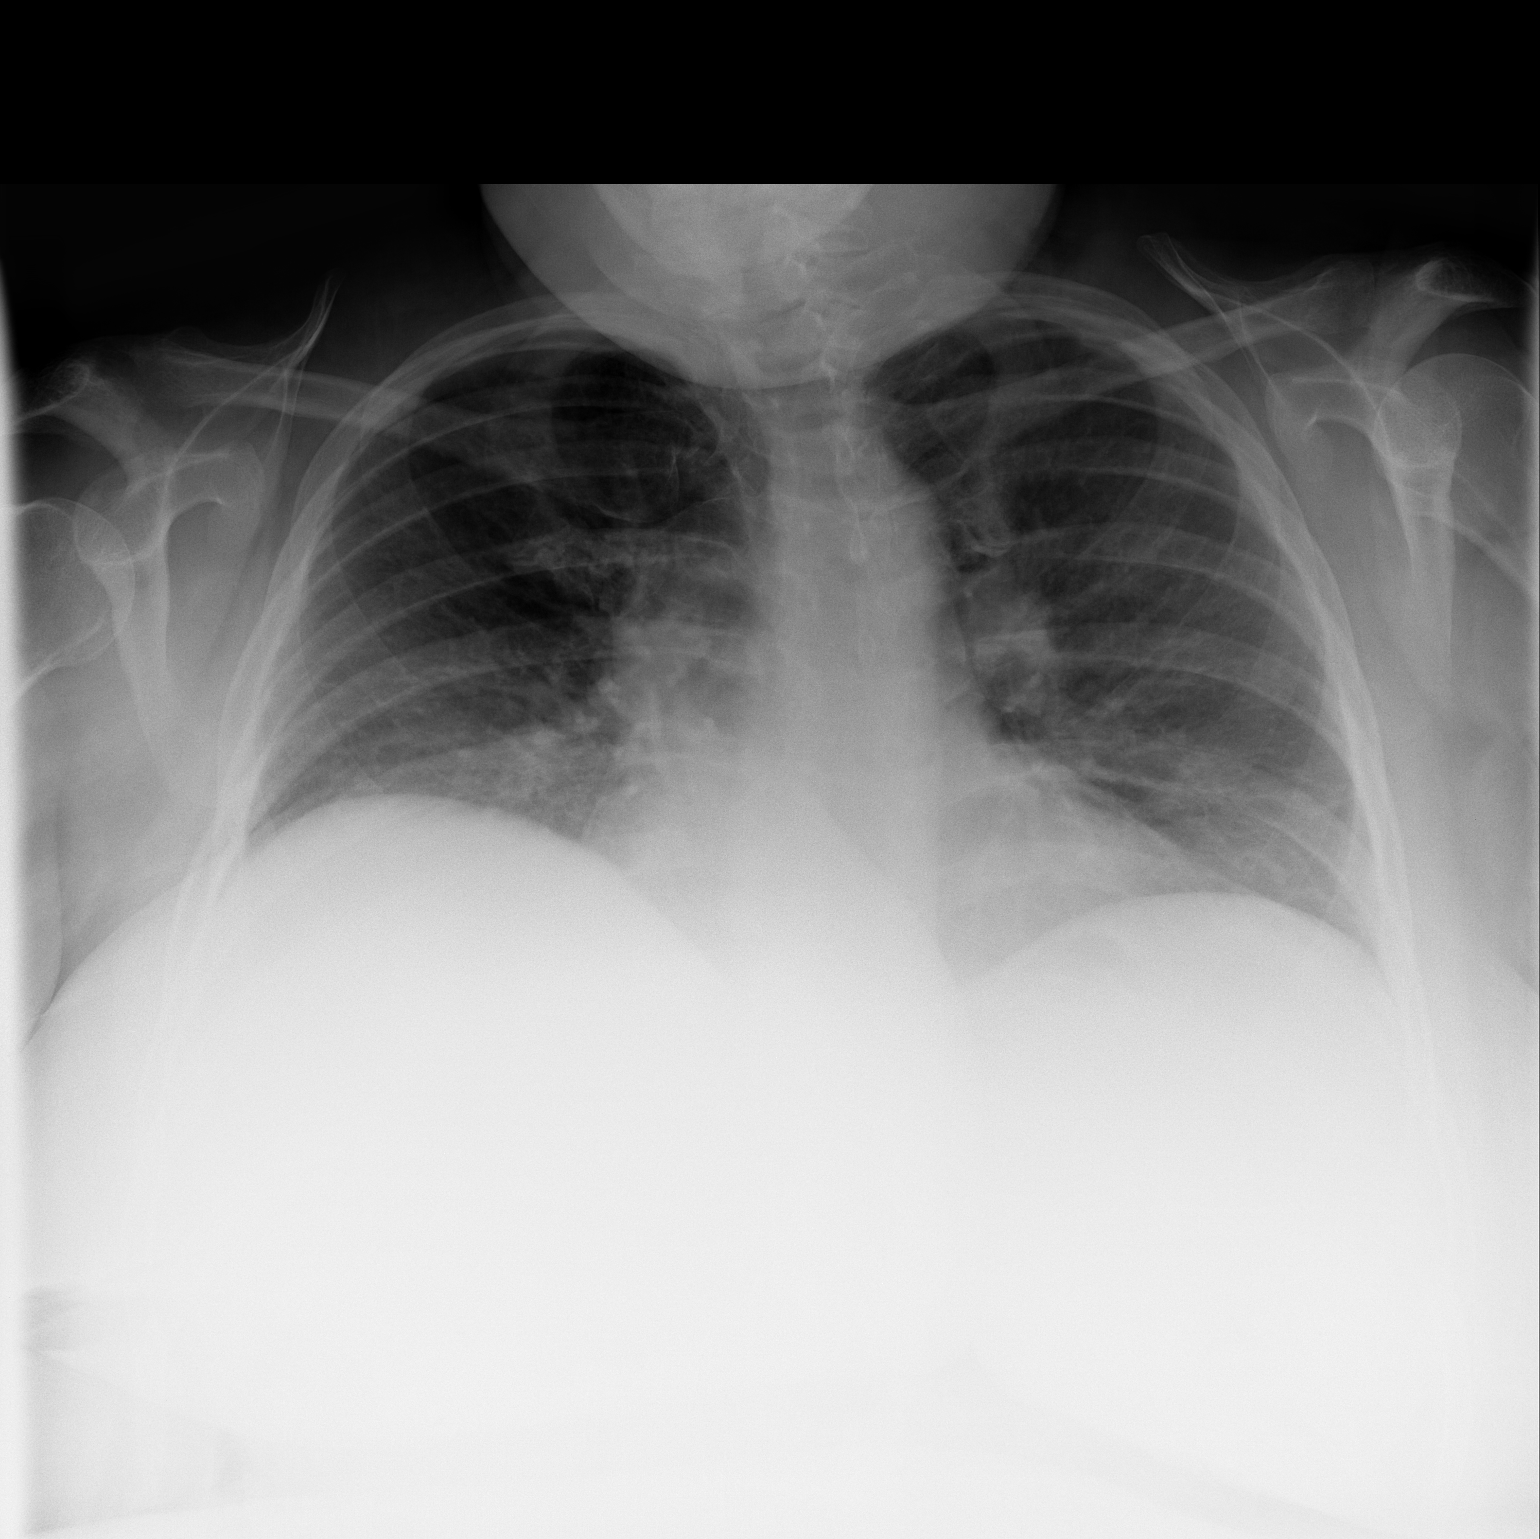

[w chest lat *]
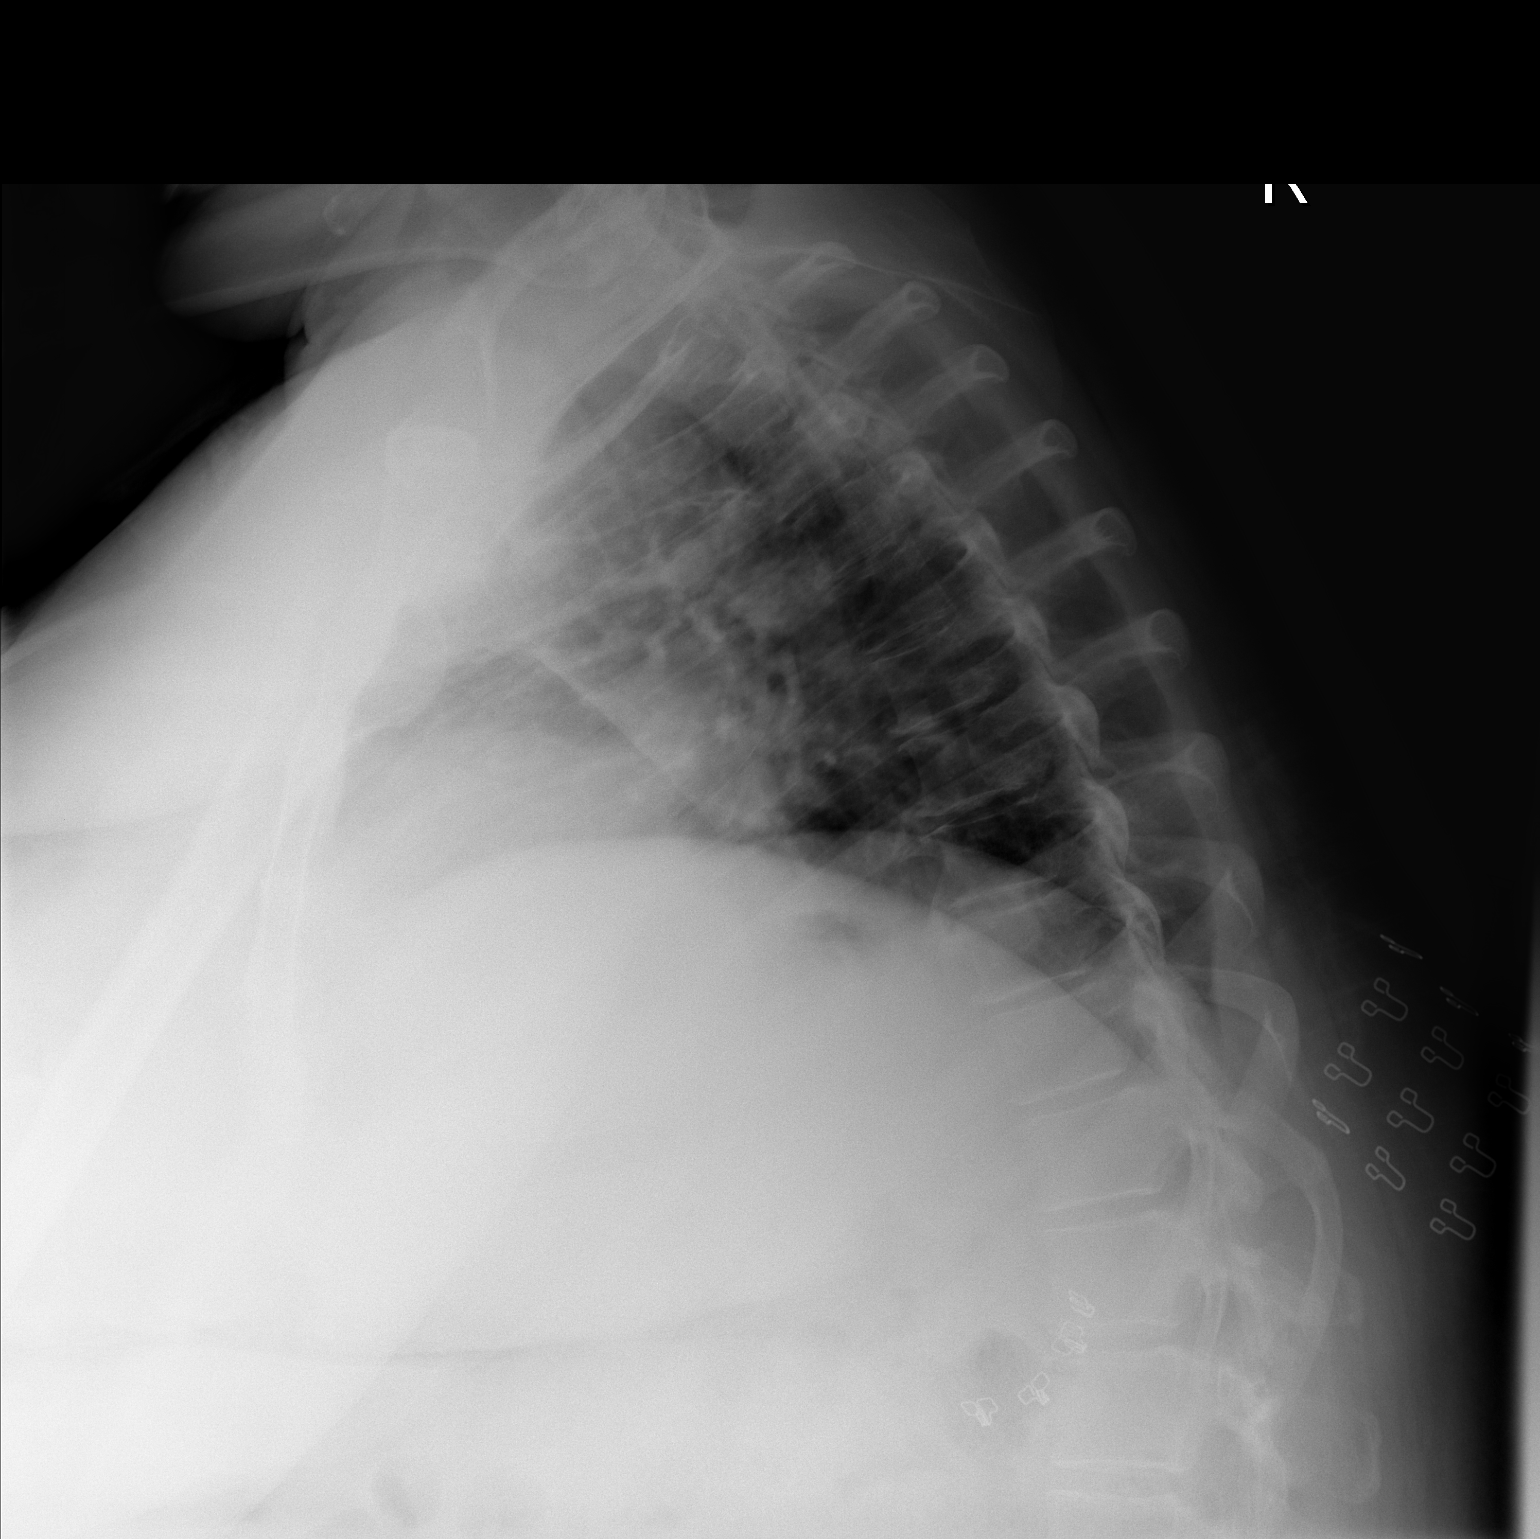

[2 of 2 positions shown; findings below may reference images not displayed]

FINDINGS: The lungs are under inflated.  There is atelectasis at both lung bases.  
 Review of the visualized osseous structures shows no fracture or dislocation.
IMPRESSION: Lungs under inflated with bibasilar atelectasis.

## 2008-09-22 IMAGING — CR DG CHEST 2V
2 series · 2 of 2 positions shown · non-contrast
Comparison: 03/04/06.

CLINICAL DATA: Shortness of breath.
 CHEST ? 2 VIEW:

[w chest lat]
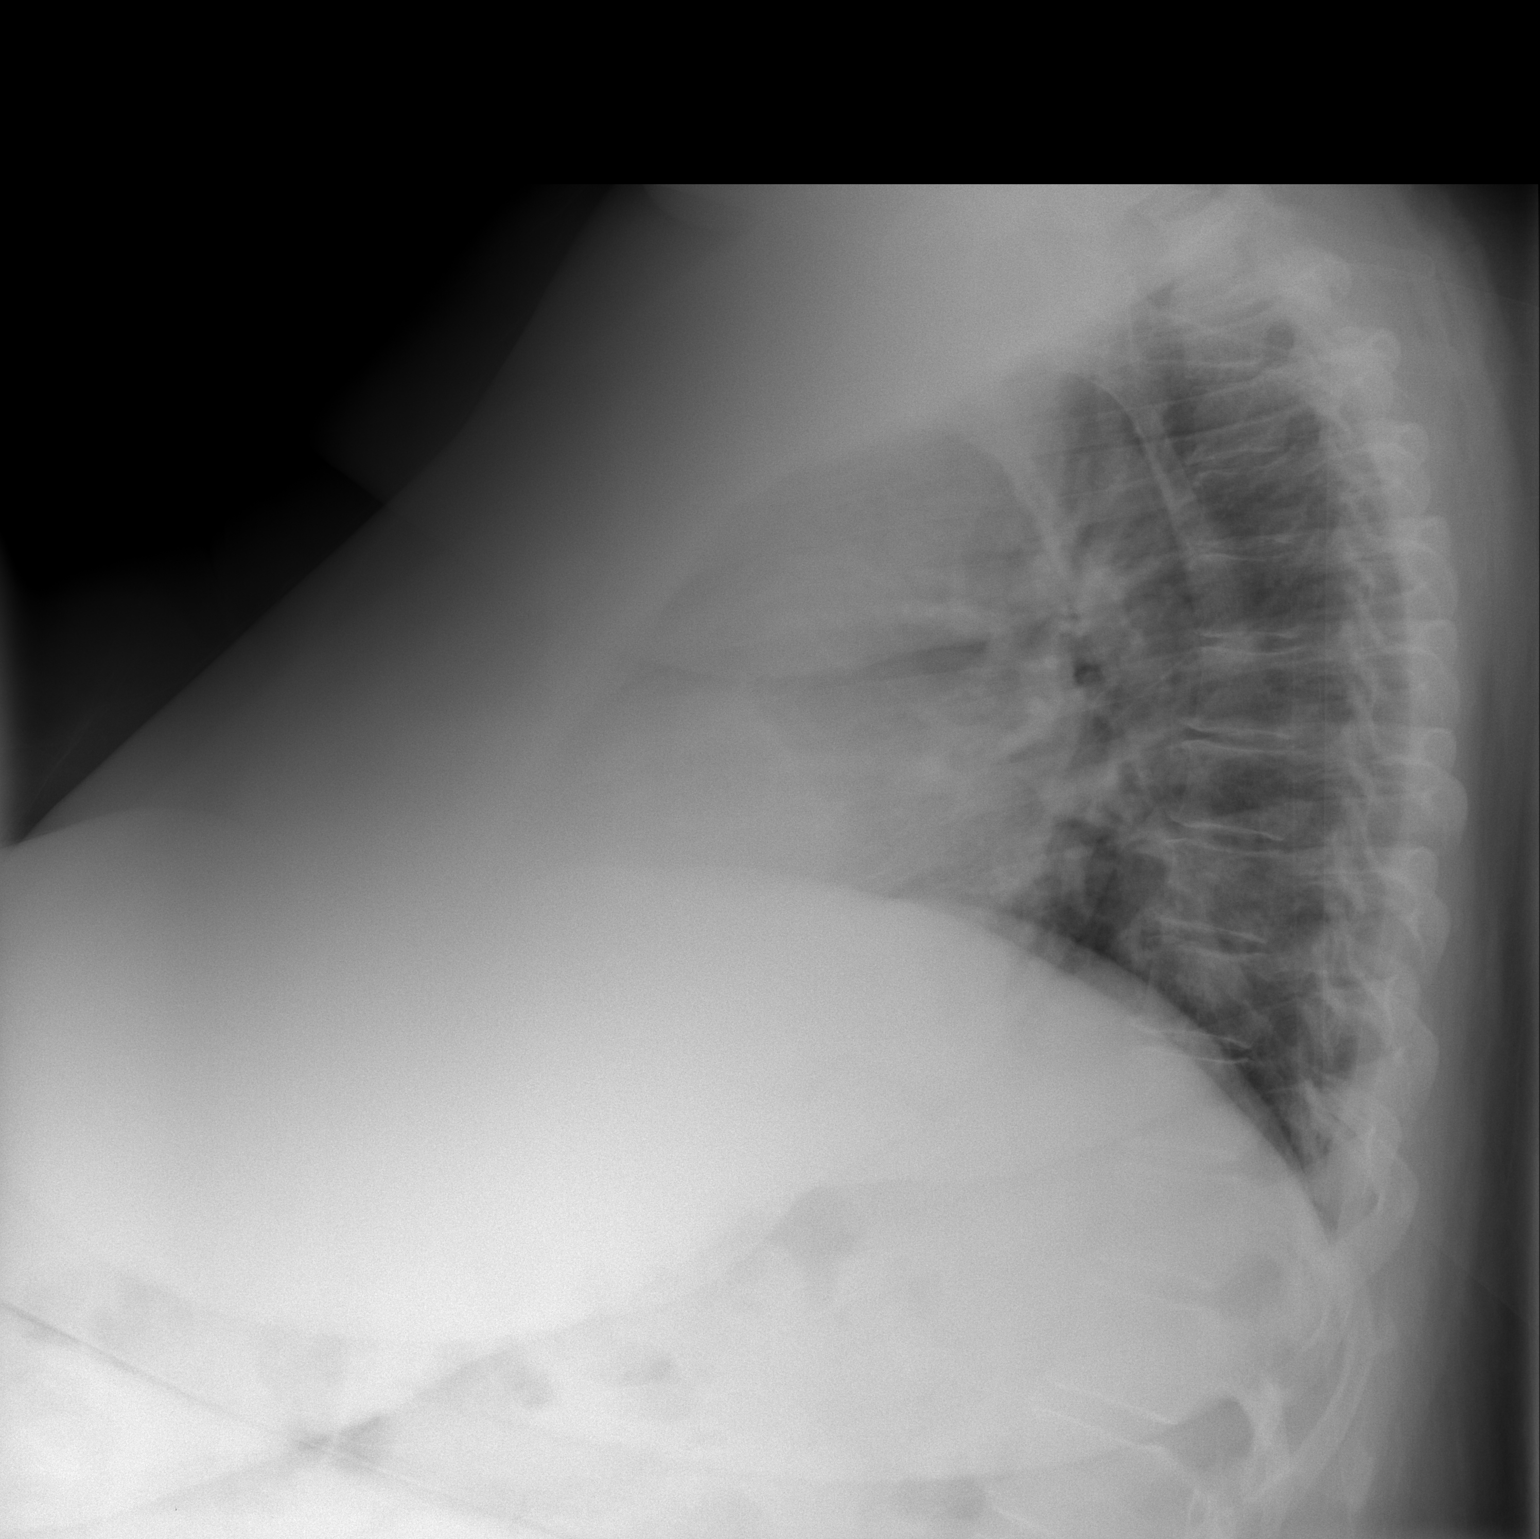

[view not recorded]
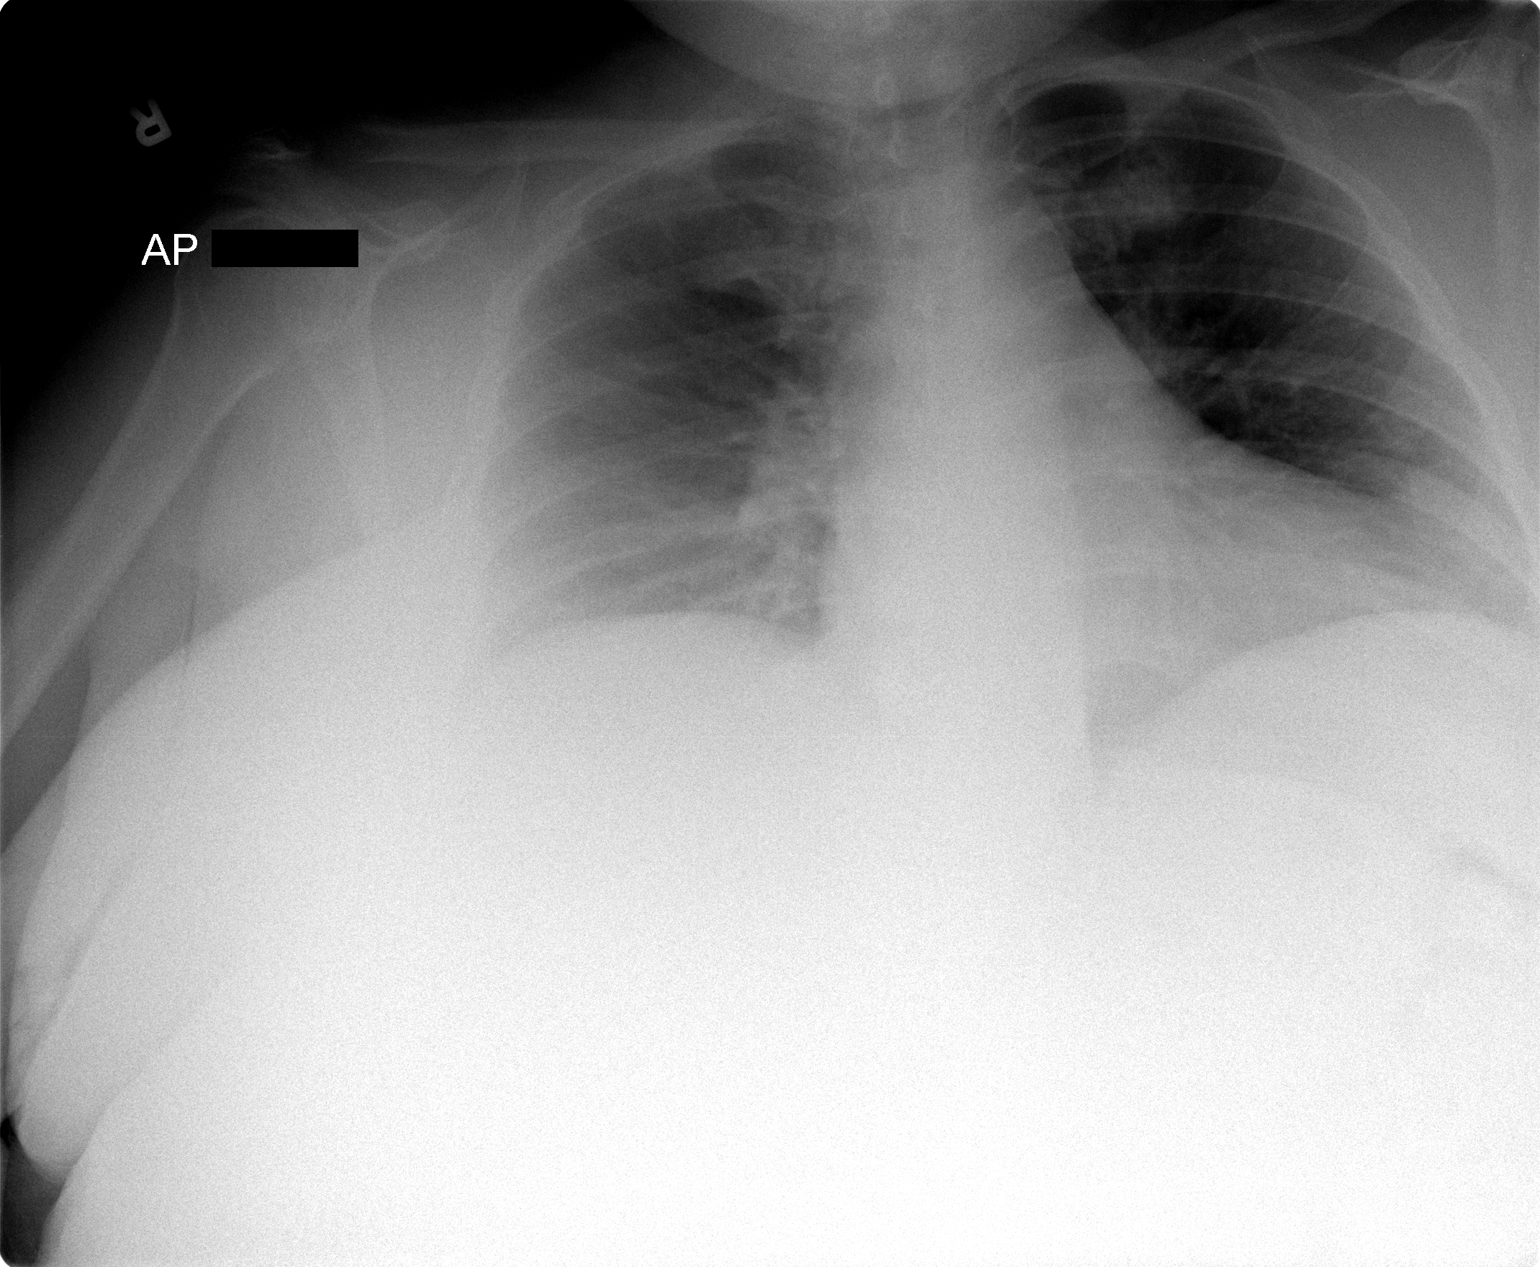

[2 of 2 positions shown; findings below may reference images not displayed]

FINDINGS: The examination is suboptimal due to the patient?s body habitus.  The heart size is normal.  There are no effusions or edema.  The lungs are under-inflated.
IMPRESSION: Lungs significantly under-inflated.

## 2008-09-22 IMAGING — CT CT ANGIO CHEST
2 of 4 series · 19 of 36 positions shown · IV contrast (APPLIED)
Comparison: Chest radiograph dated 03/06/06.

CLINICAL DATA: Difficulty breathing, shortness of breath, elevated D-Dimer.  
 CT ANGIOGRAPHY OF CHEST:
TECHNIQUE: Multidetector CT imaging of the chest was performed during bolus injection of intravenous contrast.  Multiplanar CT angiographic image reconstructions were generated to evaluate the vascular anatomy.
 Contrast:  100 cc Omni.

[Series 8: pe 1.0 b20f st · axial · 0.67mm/px · z∈[+966,+1208]mm · 16 of 270 slices shown]
[im 14/270  lung]
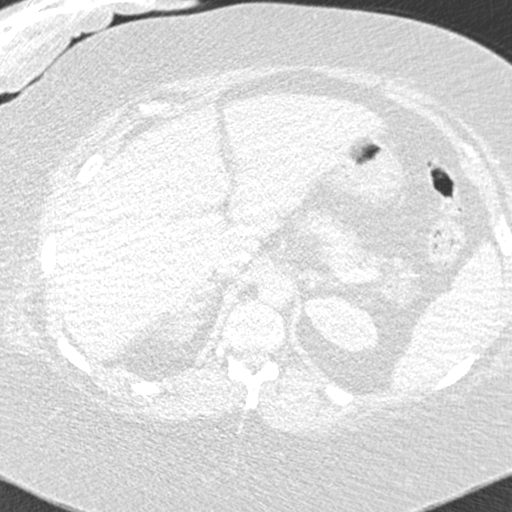
[im 27/270  mediastinal]
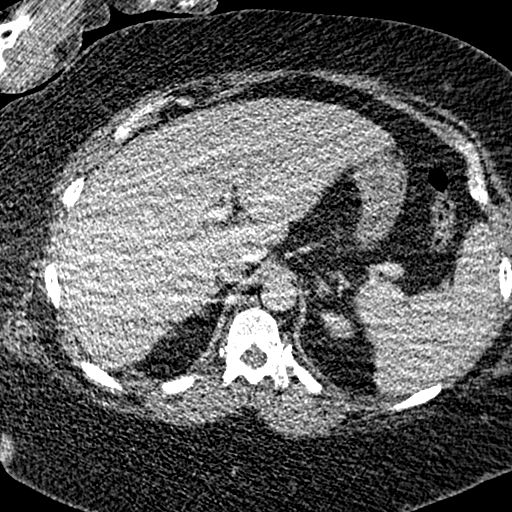
[im 41/270  lung]
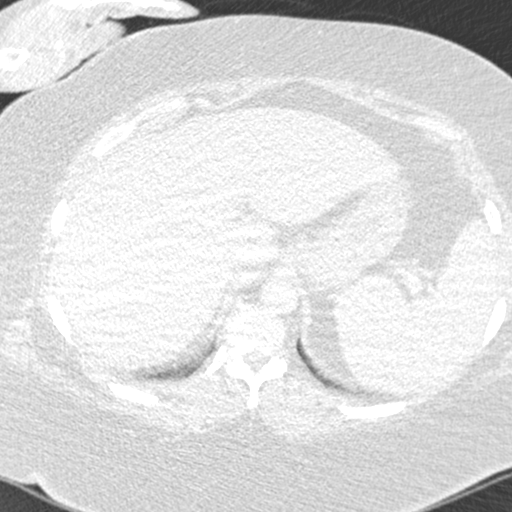
[im 68/270  mediastinal]
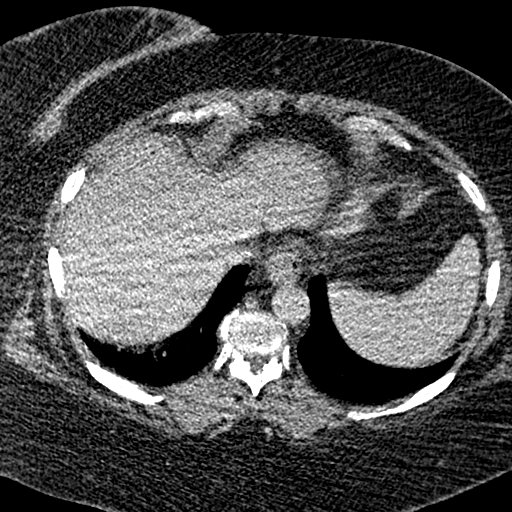
[im 81/270  lung]
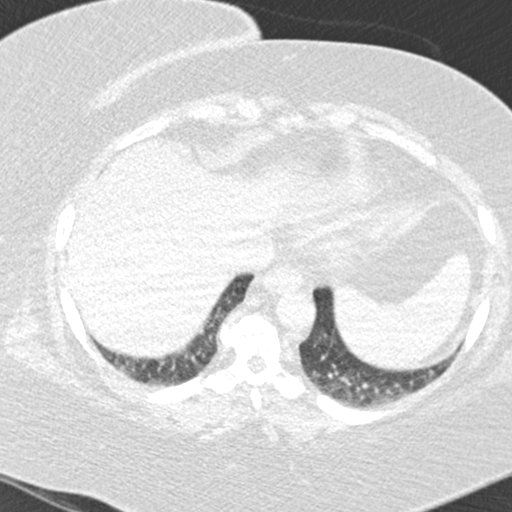
[im 95/270  mediastinal]
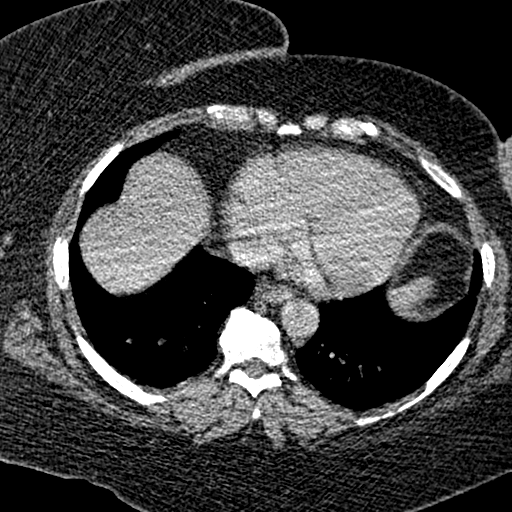
[im 108/270  lung]
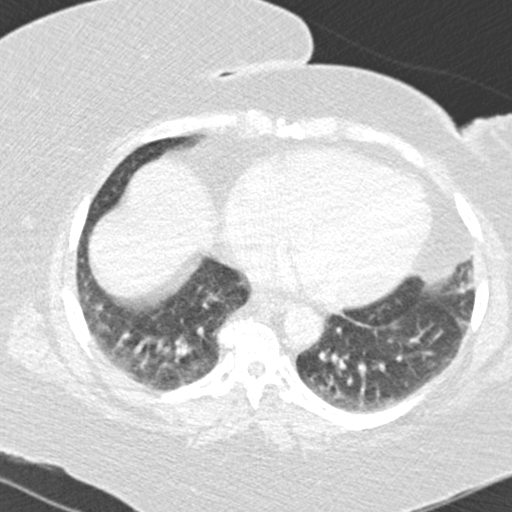
[im 122/270  mediastinal]
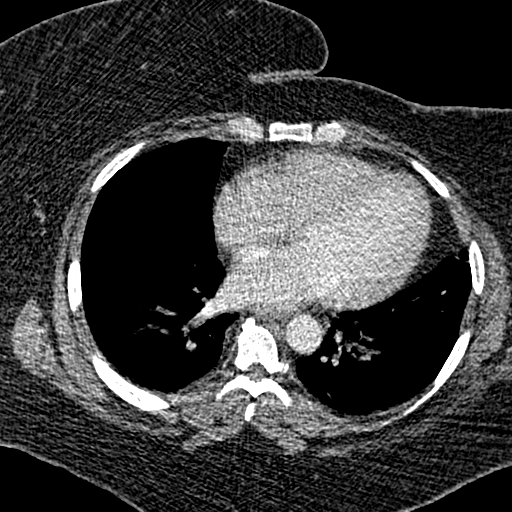
[im 148/270  lung]
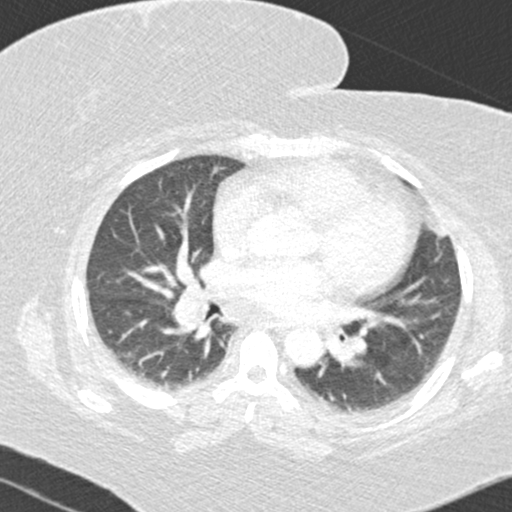
[im 162/270  mediastinal]
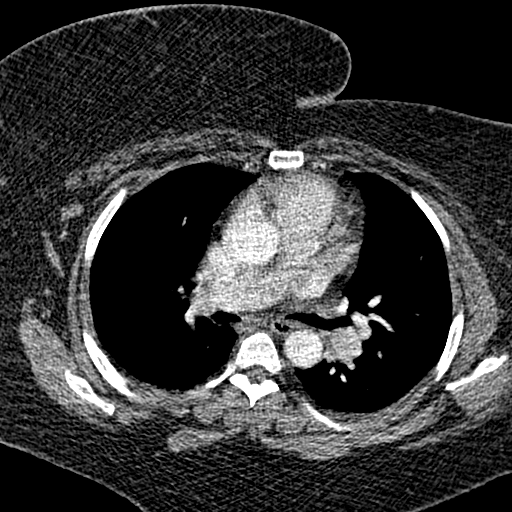
[im 175/270  lung]
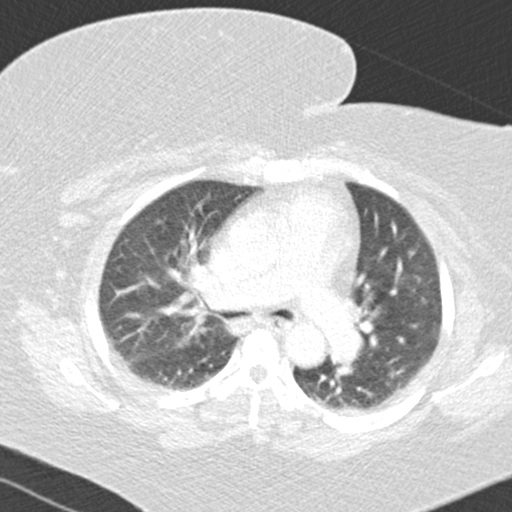
[im 189/270  mediastinal]
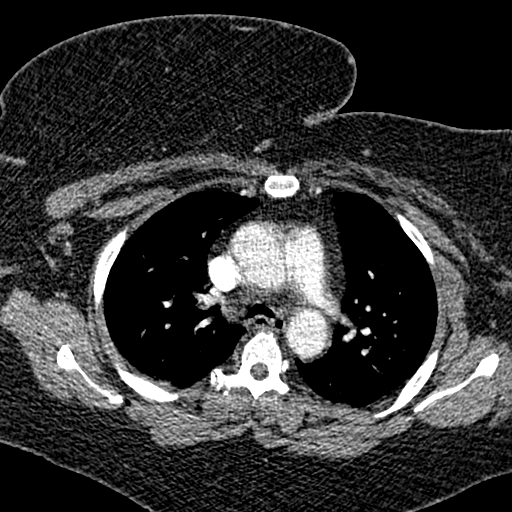
[im 202/270  lung]
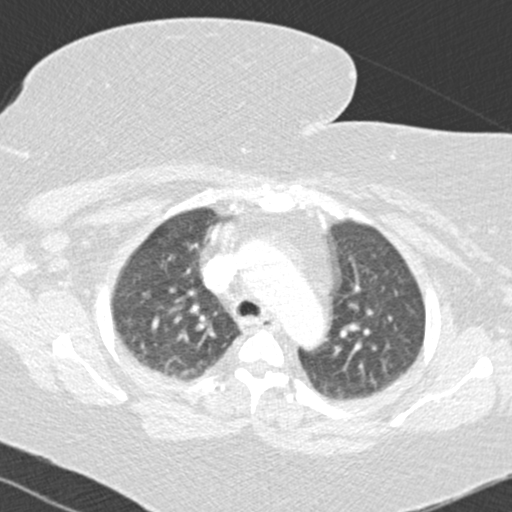
[im 229/270  mediastinal]
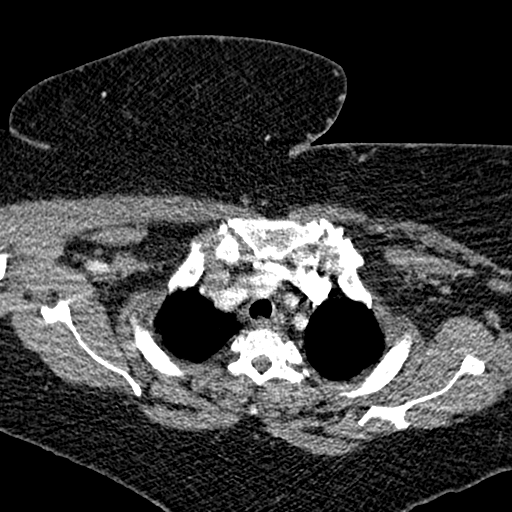
[im 243/270  lung]
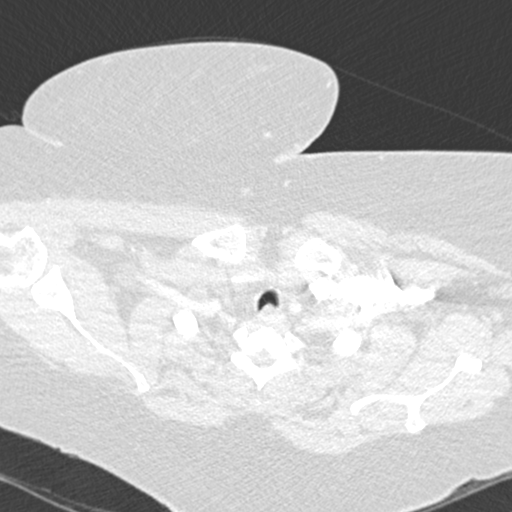
[im 256/270  mediastinal]
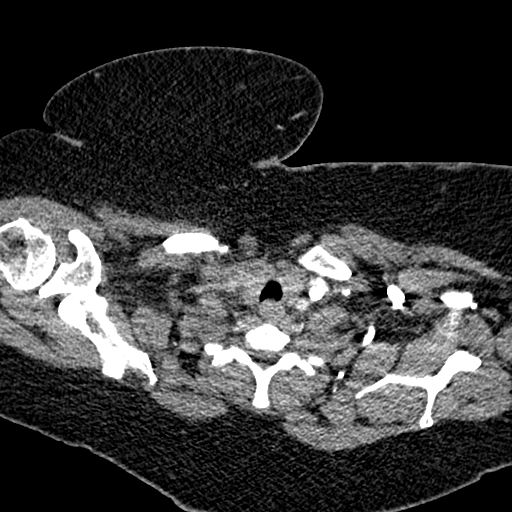

[Series 602: <mpr range> · coronal · 0.67mm/px · 3 of 68 slices shown]
[im 14/68  mediastinal]
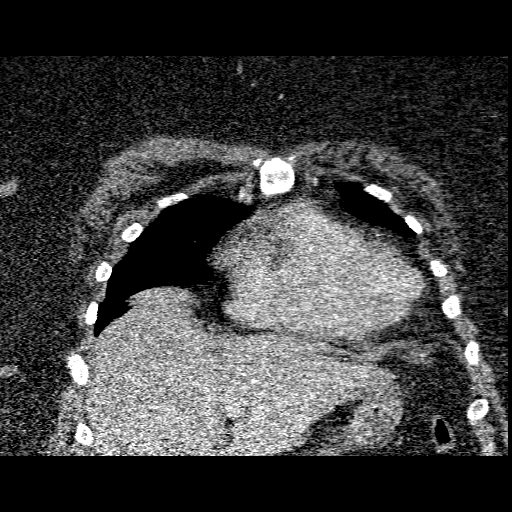
[im 27/68  mediastinal]
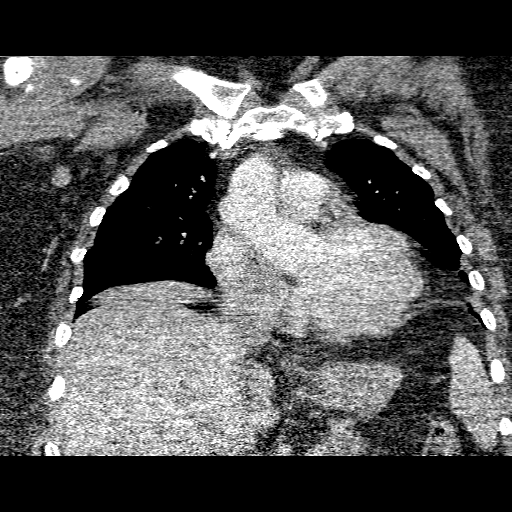
[im 41/68  mediastinal]
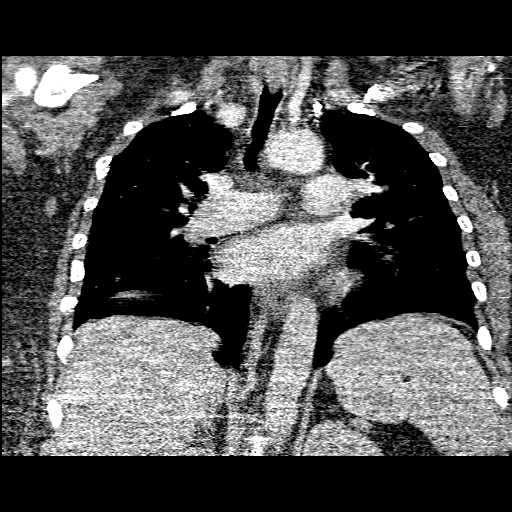

[19 of 36 positions shown; findings below may reference images not displayed]

FINDINGS: This examination is markedly suboptimal.  It is limited by patient?s body habitus as well as excessive respiratory motion artifact.  Additionally because of the patient?s body habitus the CT gantry had difficulty moving.  This also limits the quality of this study. 
 Within these limitations no large filling defects are seen within the main pulmonary artery.  There is no pleural or pericardial effusion. 
 No enlarged mediastinal or hilar lymph nodes are noted.  
 Atelectasis seen in the lingular segment of the left lung.  
 Subpleural nodule right base is noted which measures approximately 5.3 mm (image 42).  Additional subpleural nodules seen in the right upper lobe (image number [id number]).  This measures 3.4 mm.
 Multilevel thoracic spondylosis is noted.  
 Right axillary lymph node is enlarged measuring 11.8 X 19.6 mm.  Partially visualized left axillary lymph node measures 10.3 mm X 8 mm (image number 11).
IMPRESSION: 1.  Examination is of limited diagnostic value.  Exam is limited by patient?s body habitus, respiratory motion artifact and the effect of the patient?s weight on the CT gantry.  Within these limitations there is no evidence for large pulmonary embolus within the main pulmonary artery.  
 2.  Subpleural nodules right lower lobe and right middle lobe as well as borderline enlarged axillary lymph nodes.  A follow-up examination in three months is recommended.

## 2008-10-06 ENCOUNTER — Ambulatory Visit: Payer: Self-pay | Admitting: Internal Medicine

## 2008-10-06 ENCOUNTER — Encounter: Payer: Self-pay | Admitting: Internal Medicine

## 2008-10-06 ENCOUNTER — Inpatient Hospital Stay (HOSPITAL_COMMUNITY): Admission: EM | Admit: 2008-10-06 | Discharge: 2008-10-11 | Payer: Self-pay | Admitting: Emergency Medicine

## 2008-10-07 ENCOUNTER — Encounter (INDEPENDENT_AMBULATORY_CARE_PROVIDER_SITE_OTHER): Payer: Self-pay | Admitting: Internal Medicine

## 2008-10-18 ENCOUNTER — Telehealth: Payer: Self-pay | Admitting: *Deleted

## 2008-10-20 ENCOUNTER — Emergency Department (HOSPITAL_COMMUNITY): Admission: EM | Admit: 2008-10-20 | Discharge: 2008-10-20 | Payer: Self-pay | Admitting: Emergency Medicine

## 2008-11-08 ENCOUNTER — Ambulatory Visit: Payer: Self-pay | Admitting: Internal Medicine

## 2008-11-13 IMAGING — CR DG KNEE COMPLETE 4+V*R*
4 series · 4 of 4 positions shown · non-contrast
Comparison: 02/25/06.

CLINICAL DATA: Fall in [REDACTED] and again a couple of weeks ago.  Right anterior to lateral knee pain.  
 RIGHT KNEE ? 4 VIEW:

[t knee ap right]
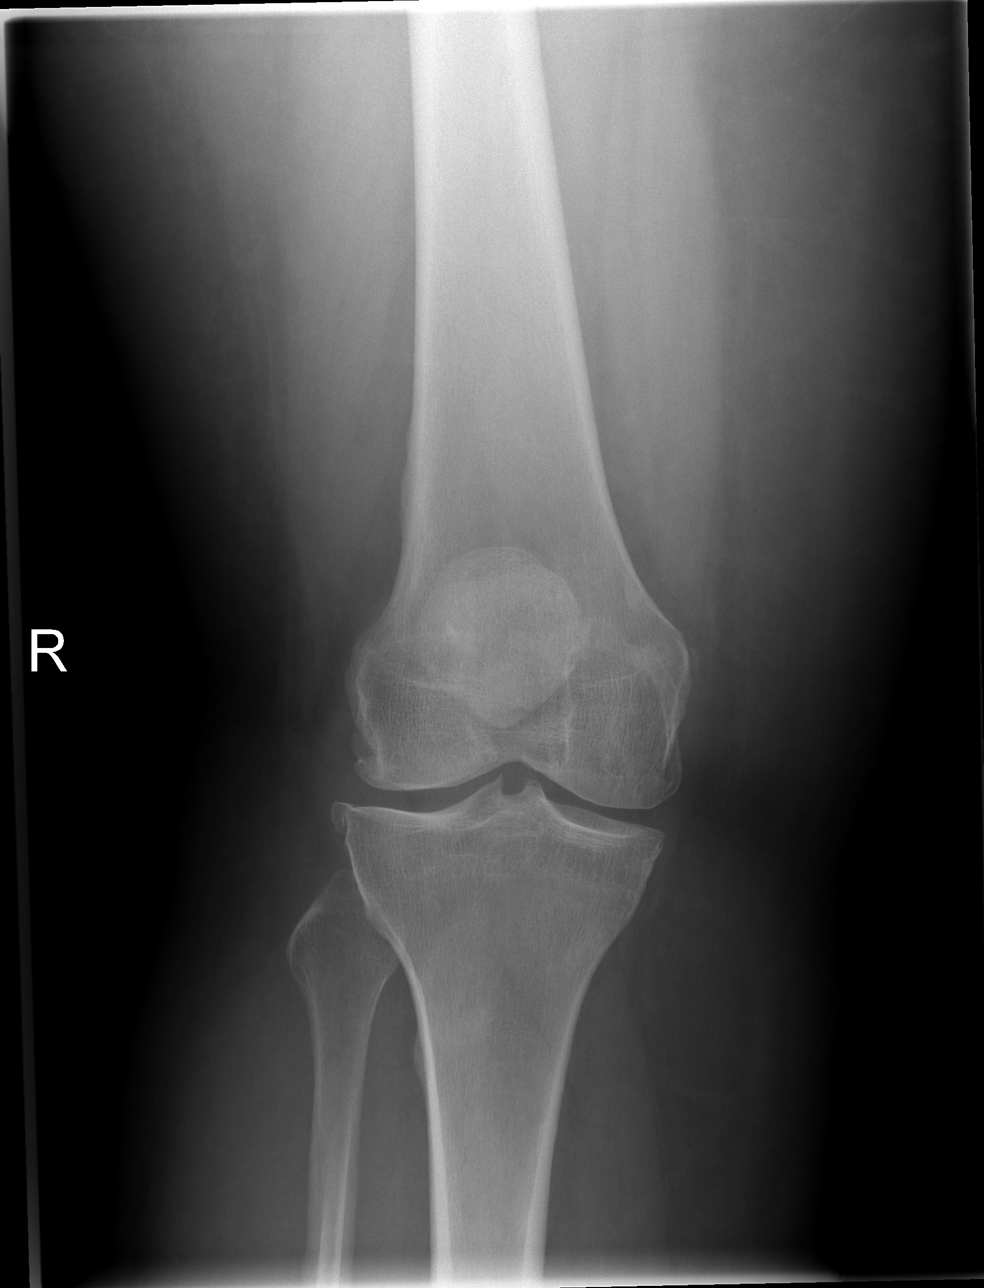

[t knee oblique right (1 of 2)]
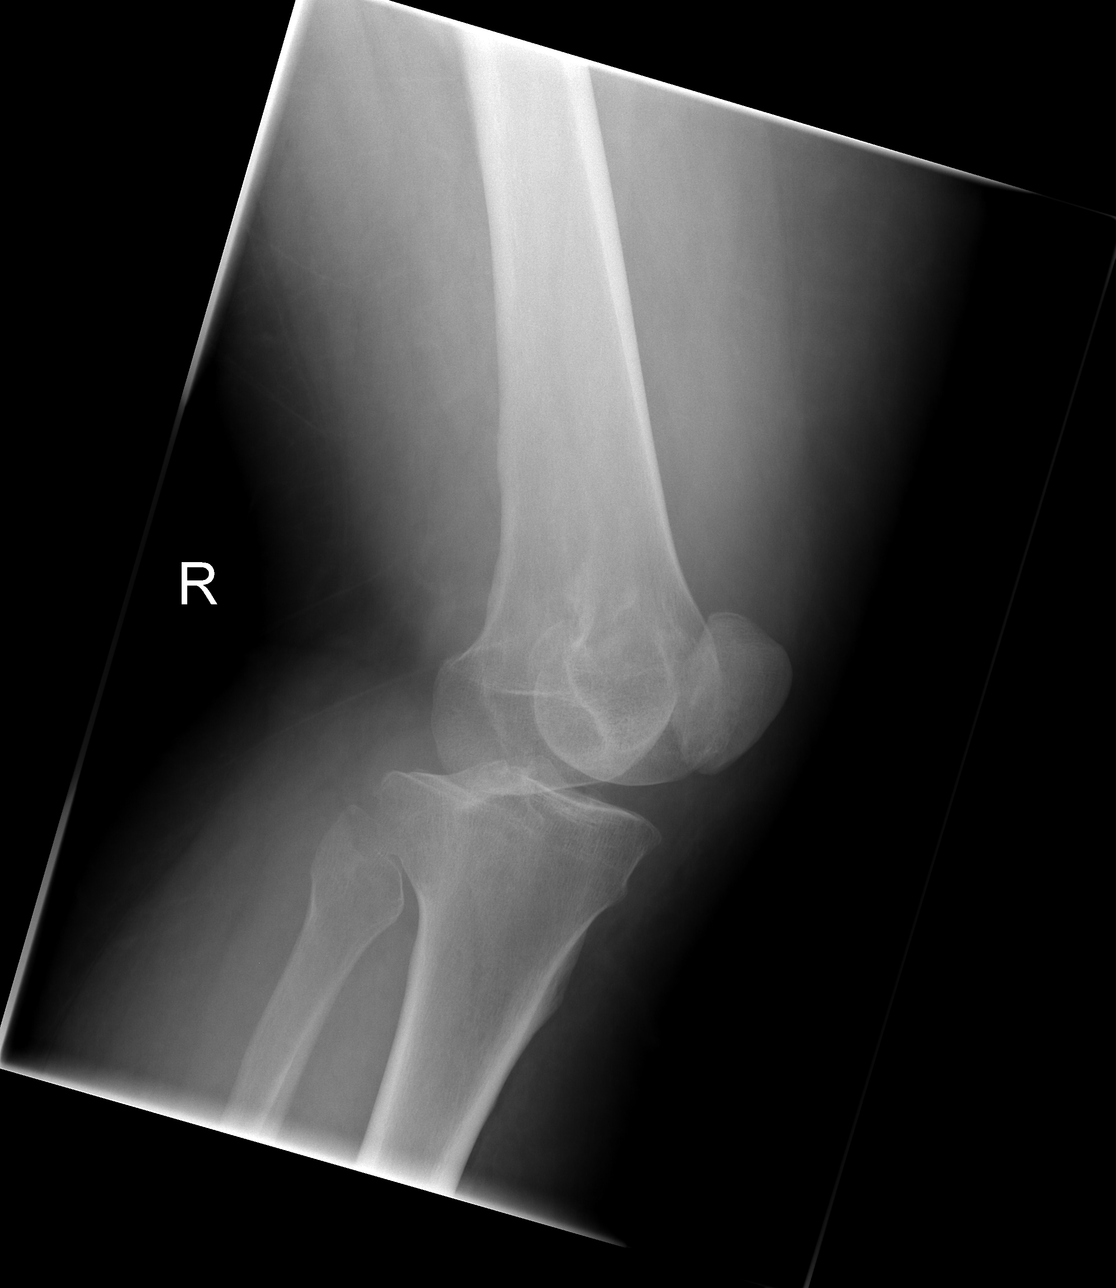

[t knee oblique right (2 of 2)]
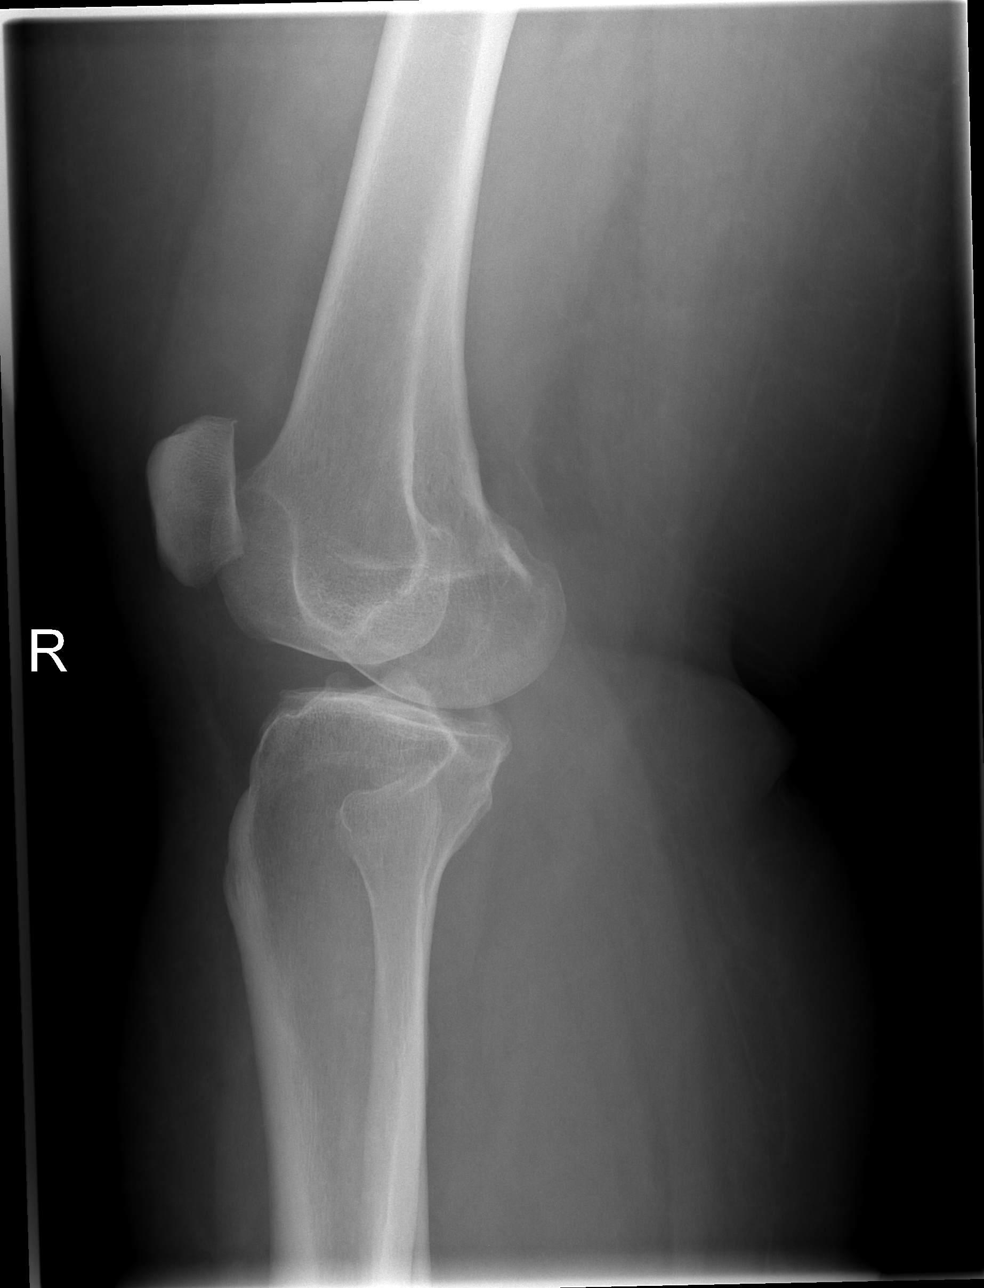

[t knee lat right]
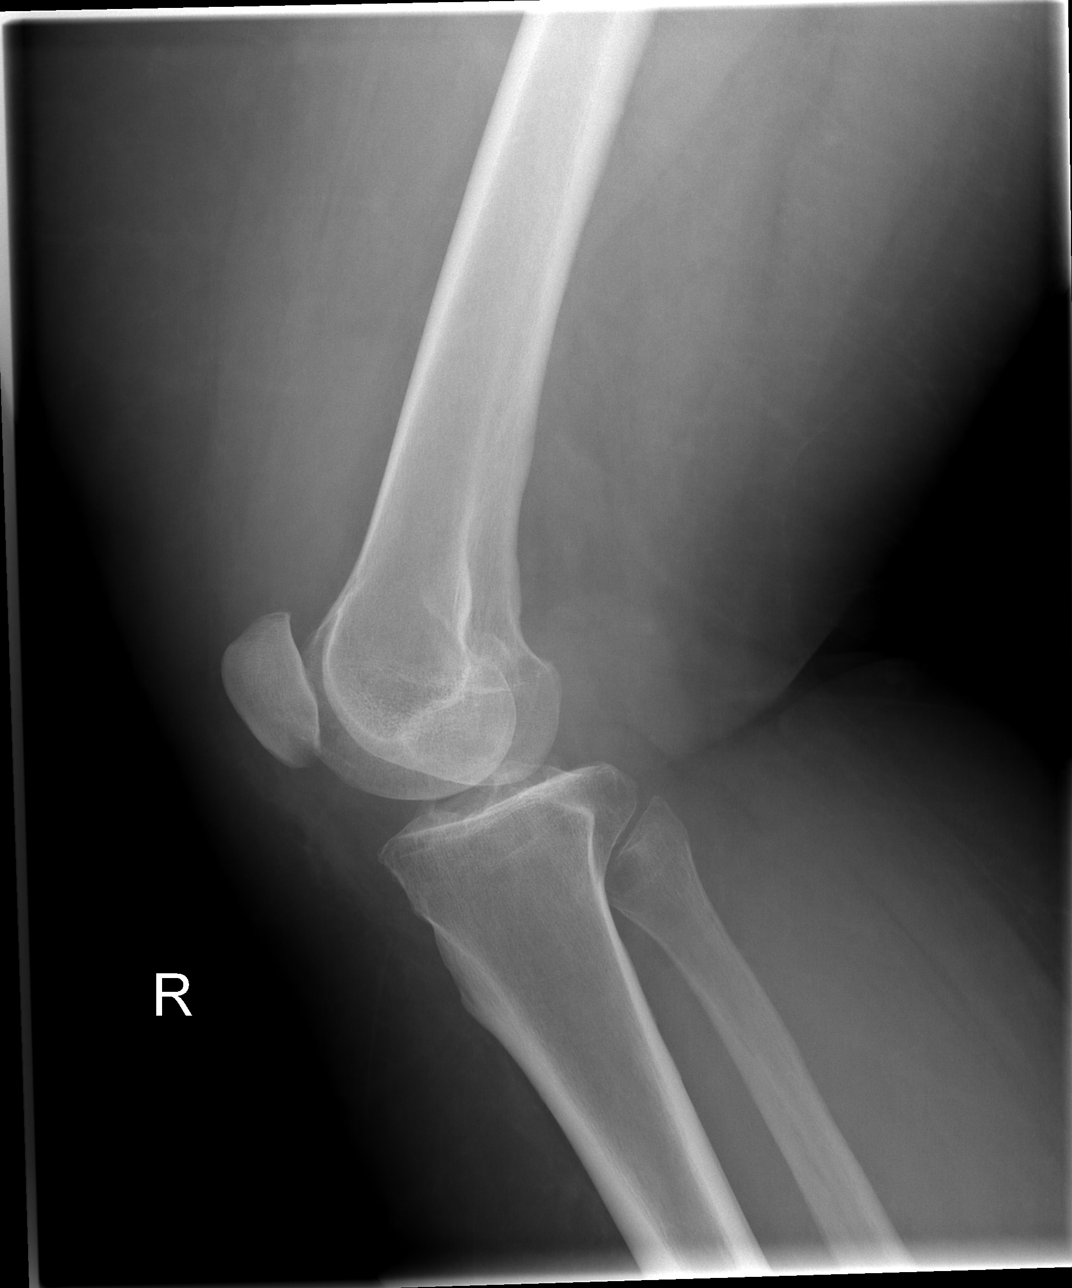

[4 of 4 positions shown; findings below may reference images not displayed]

FINDINGS: Mild patellofemoral and medial compartment osteoarthritis.  Moderate lateral compartment osteoarthritis.  Probable joint effusion although evaluation of the lateral view is limited by patient?s size.  No acute fracture or dislocation.
IMPRESSION: Three compartment osteoarthritis and probable joint effusion.

## 2008-11-18 IMAGING — CR DG CHEST 2V
2 series · 2 of 2 positions shown · non-contrast
Comparison: none

CLINICAL DATA: Asthma, shortness of breath, cough, hypertension, diagnostic pneumonia.
 CHEST- 2 VIEWS - 05/02/06:

[view not recorded (1 of 2)]
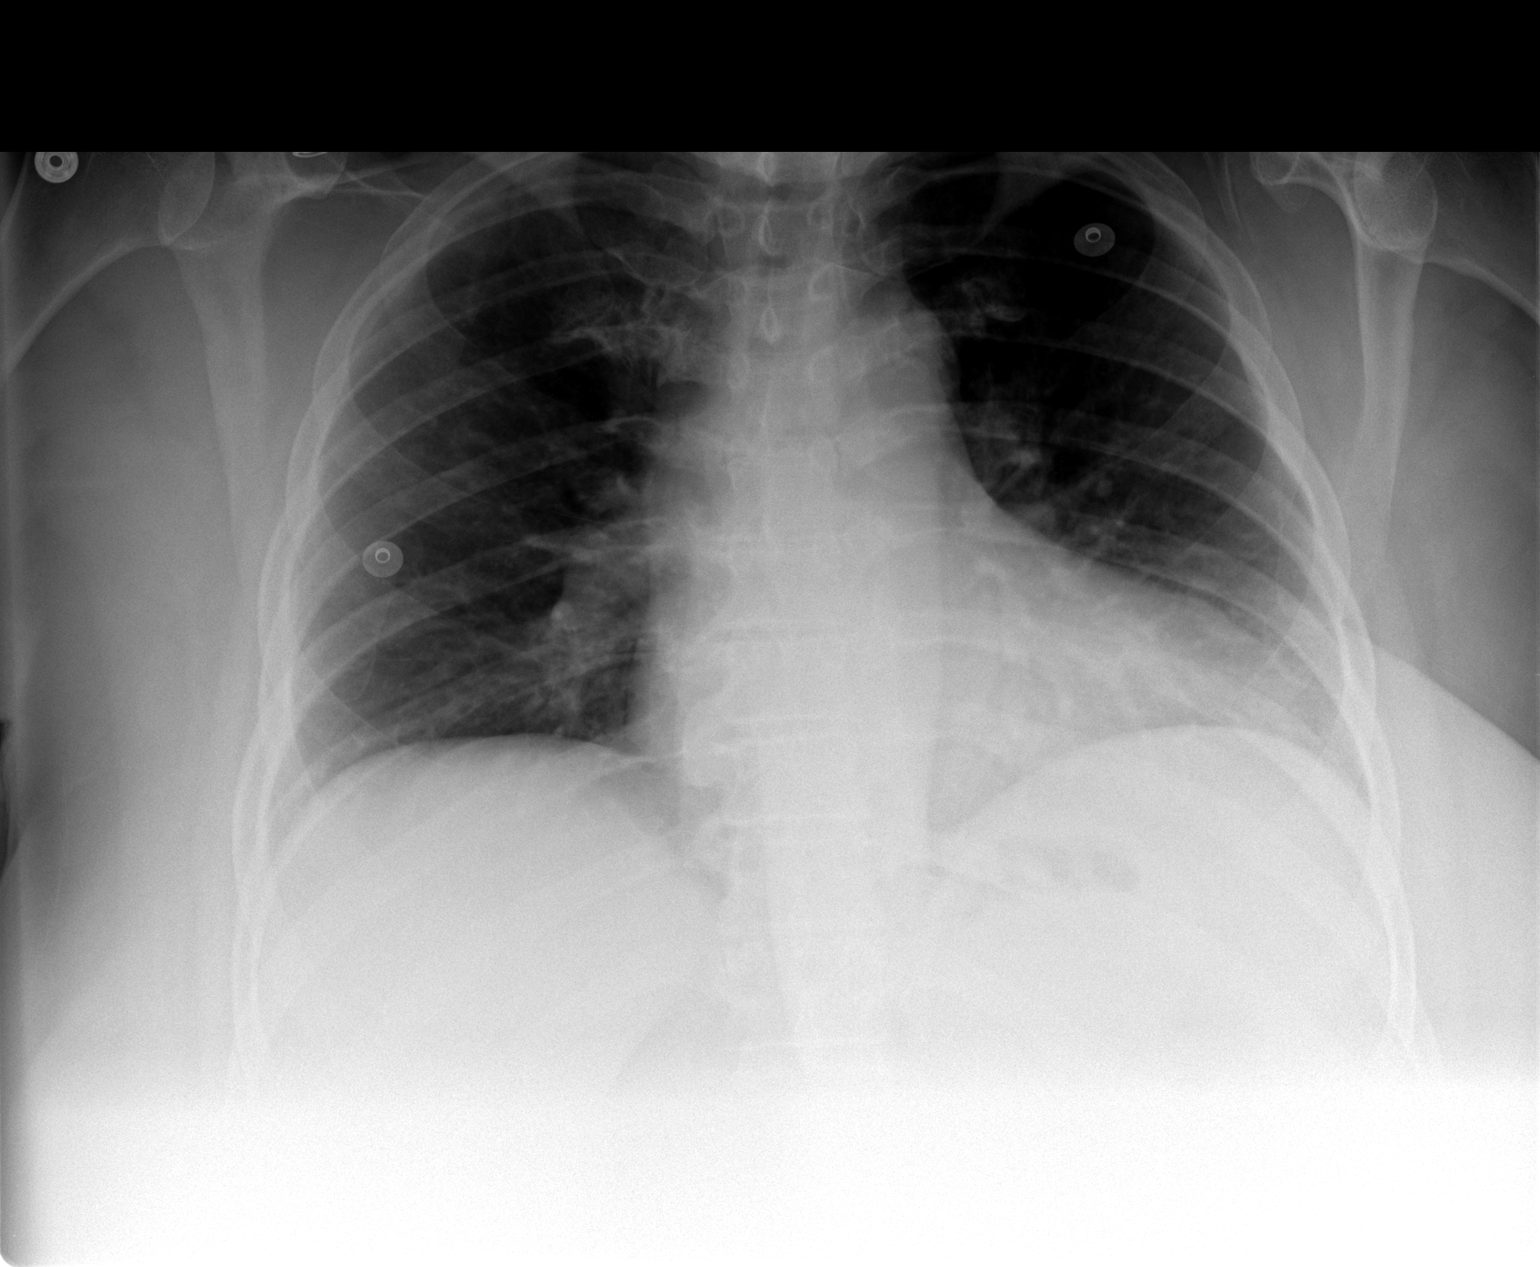

[view not recorded (2 of 2)]
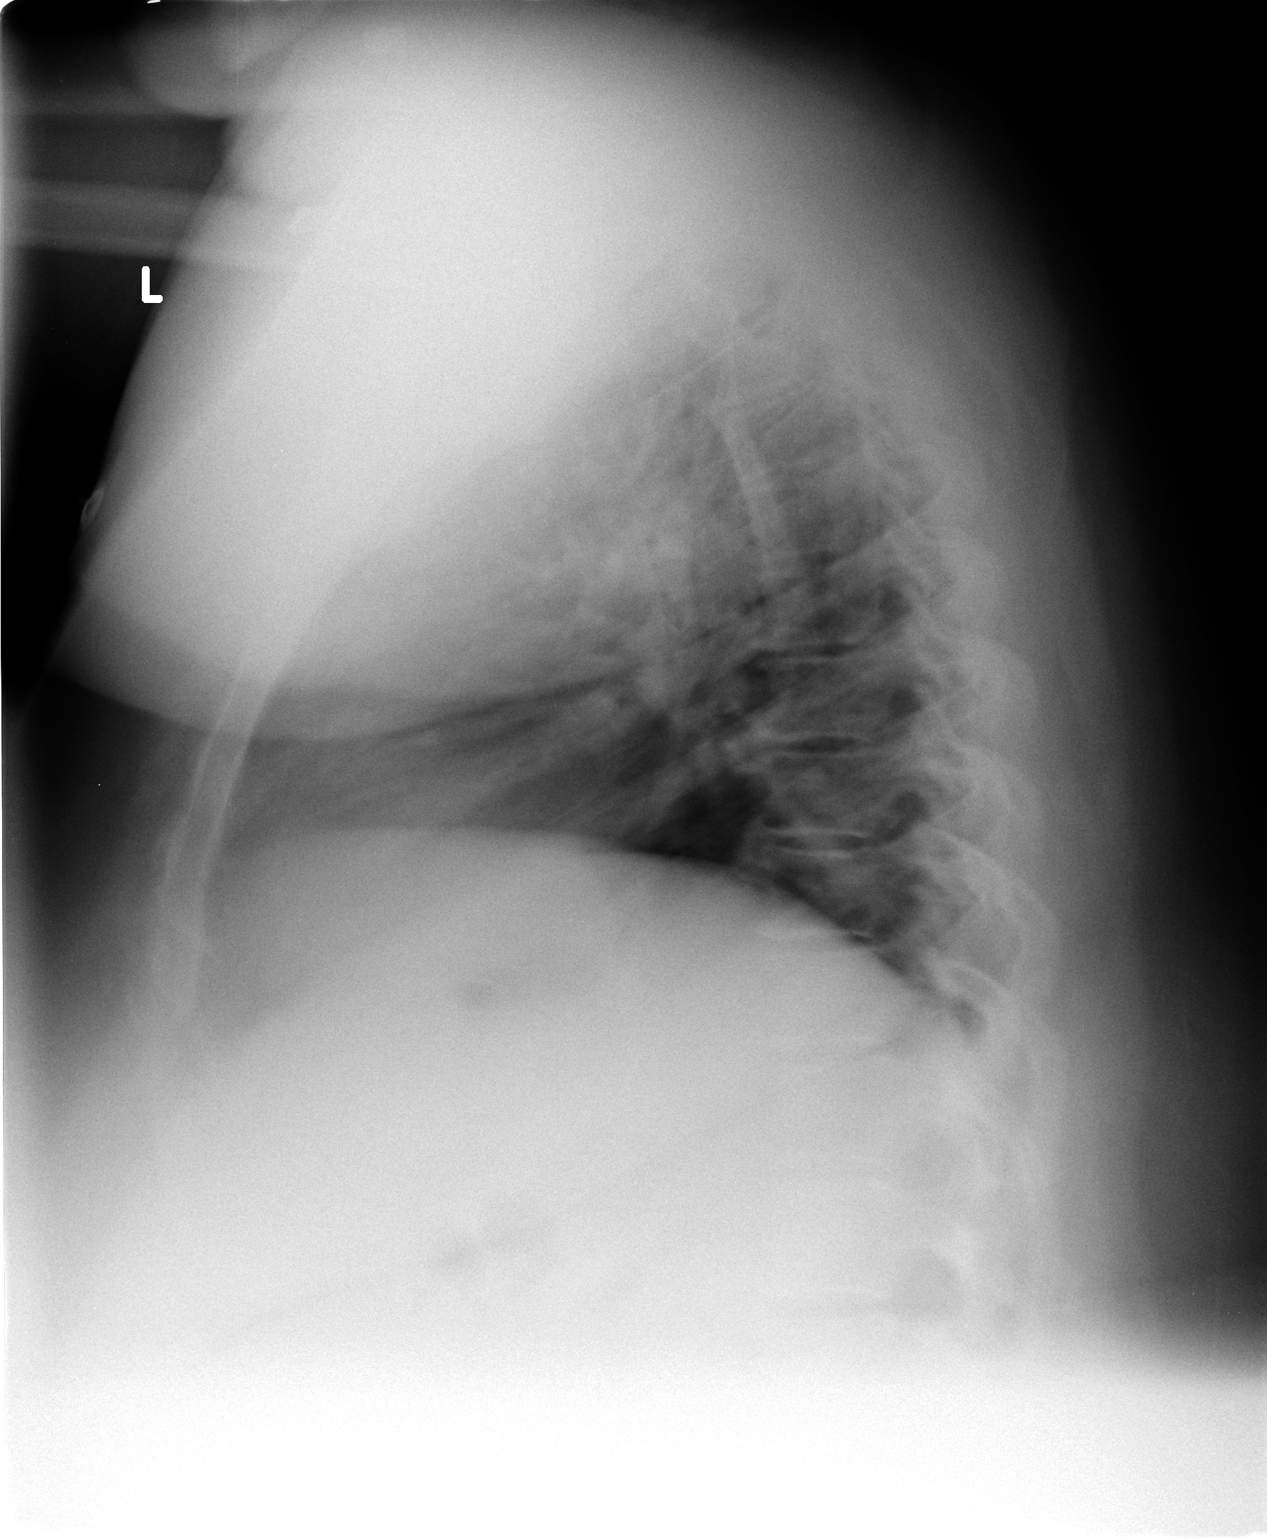

[2 of 2 positions shown; findings below may reference images not displayed]

FINDINGS: Low volume lungs.  No active infiltrate, consolidation, or atelectasis.  No congestive heart failure.
IMPRESSION: Specifically, negative for pneumonia.

## 2008-11-20 IMAGING — CR DG CHEST 1V PORT
1 series · 1 of 1 positions shown · non-contrast
Comparison: 05/02/06.

CLINICAL DATA: Short of breath.  Allergies.  Chest pain.
 PORTABLE CHEST ([DATE] HOURS):

[view not recorded]
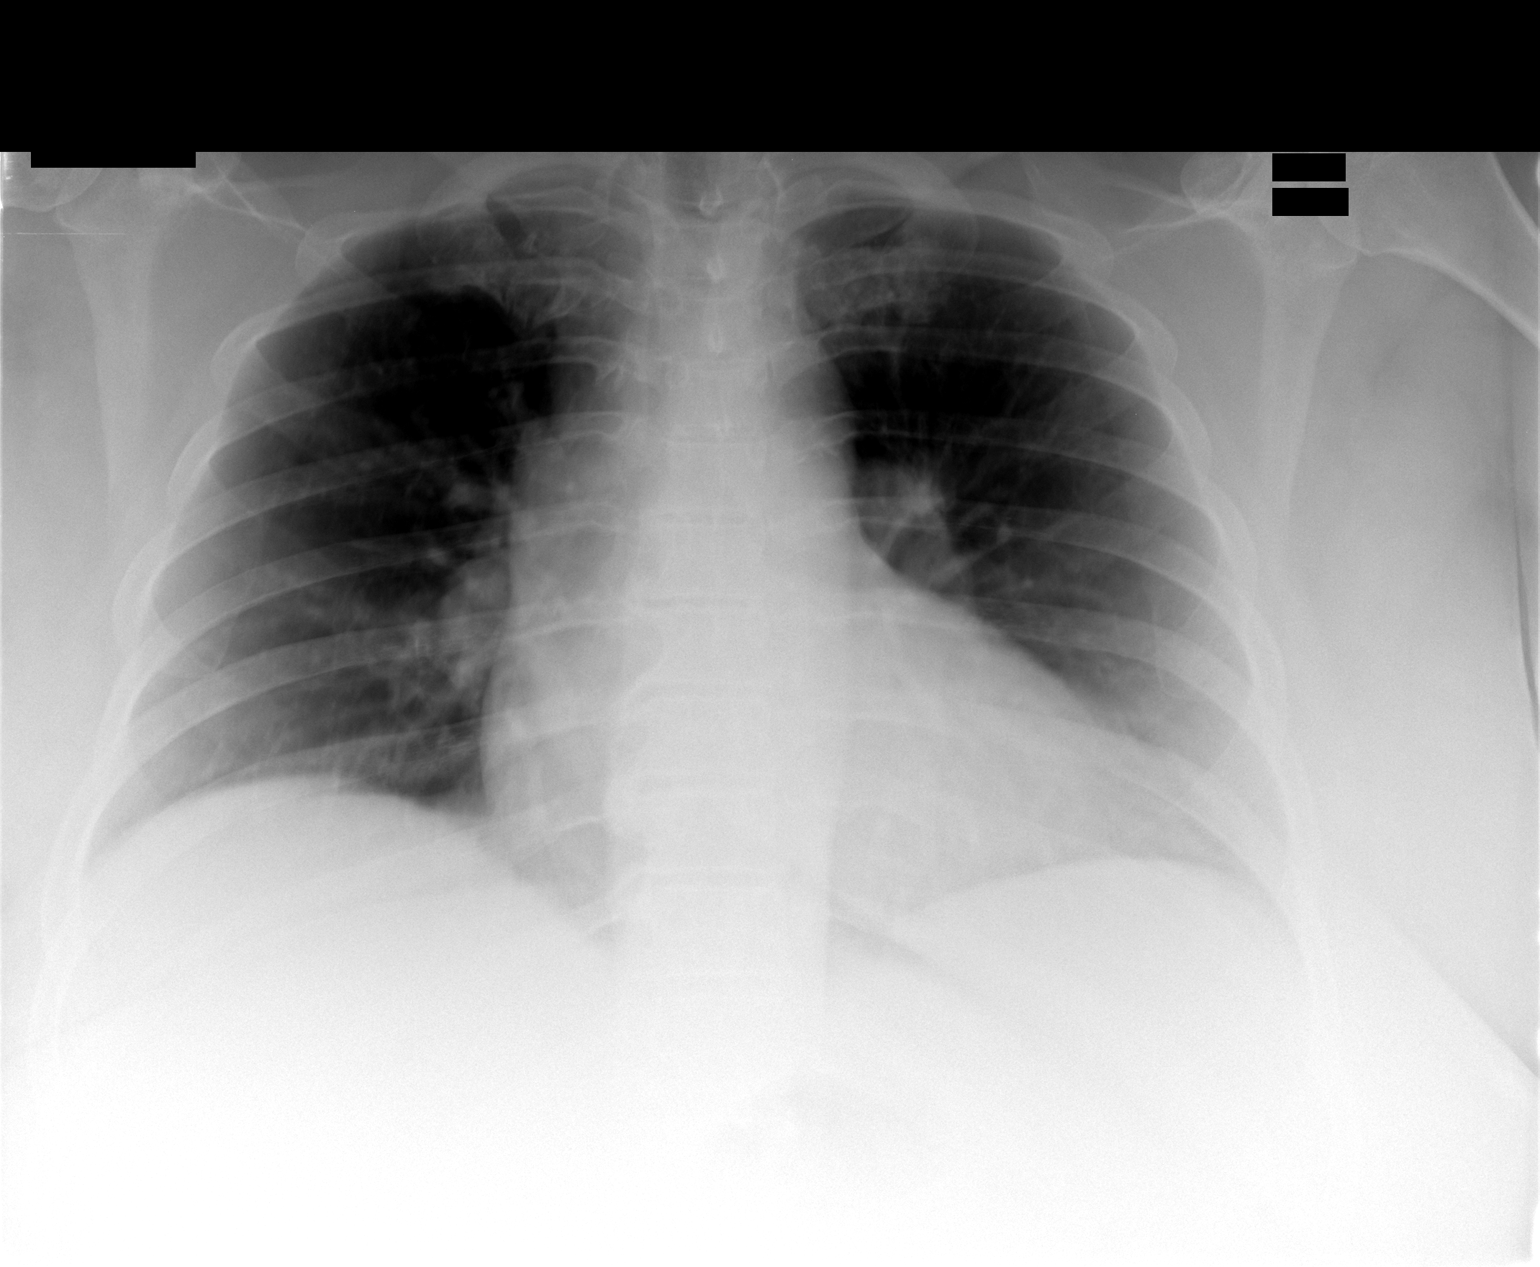

[1 of 1 positions shown; findings below may reference images not displayed]

FINDINGS: There is mild motion present.  However no focal infiltrate or effusion is seen.  The heart is mildly enlarged.
IMPRESSION: Mild cardiomegaly.  No active lung disease.

## 2008-11-30 ENCOUNTER — Encounter (INDEPENDENT_AMBULATORY_CARE_PROVIDER_SITE_OTHER): Payer: Self-pay | Admitting: Internal Medicine

## 2008-12-08 ENCOUNTER — Ambulatory Visit: Payer: Self-pay | Admitting: Internal Medicine

## 2008-12-08 LAB — CONVERTED CEMR LAB
Basophils Relative: 0 % (ref 0–1)
CO2: 28 meq/L (ref 19–32)
Calcium: 8.9 mg/dL (ref 8.4–10.5)
Lymphocytes Relative: 25 % (ref 12–46)
Lymphs Abs: 2.2 10*3/uL (ref 0.7–4.0)
Monocytes Relative: 6 % (ref 3–12)
Neutro Abs: 5.9 10*3/uL (ref 1.7–7.7)
Neutrophils Relative %: 65 % (ref 43–77)
Potassium: 4.5 meq/L (ref 3.5–5.3)
RBC: 4.83 M/uL (ref 3.87–5.11)
Sodium: 143 meq/L (ref 135–145)
WBC: 9.1 10*3/uL (ref 4.0–10.5)

## 2008-12-19 ENCOUNTER — Telehealth: Payer: Self-pay | Admitting: *Deleted

## 2008-12-20 ENCOUNTER — Telehealth: Payer: Self-pay | Admitting: Internal Medicine

## 2008-12-21 IMAGING — CR DG CHEST 2V
3 series · 3 of 3 positions shown · non-contrast
Comparison: Portable chest x-ray 05/04/2006.

CLINICAL DATA: Headaches, shortness of breath, cough.

CHEST - 2 VIEW  [DATE]:

[w chest pa *]
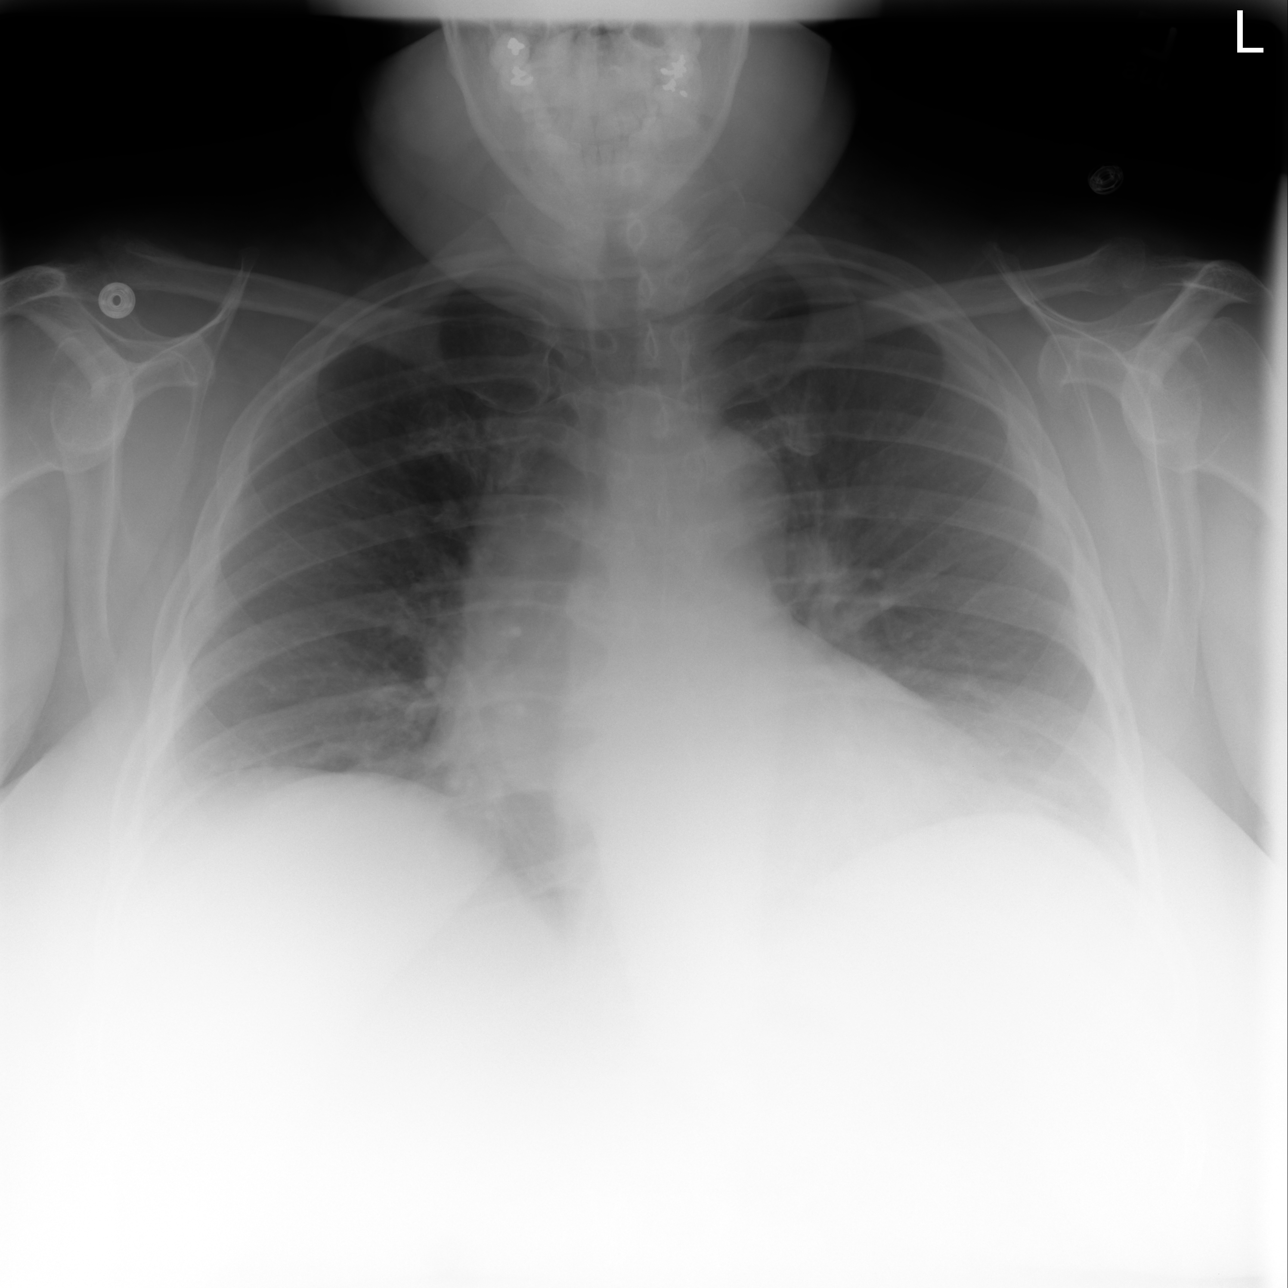

[w chest lat * (1 of 2)]
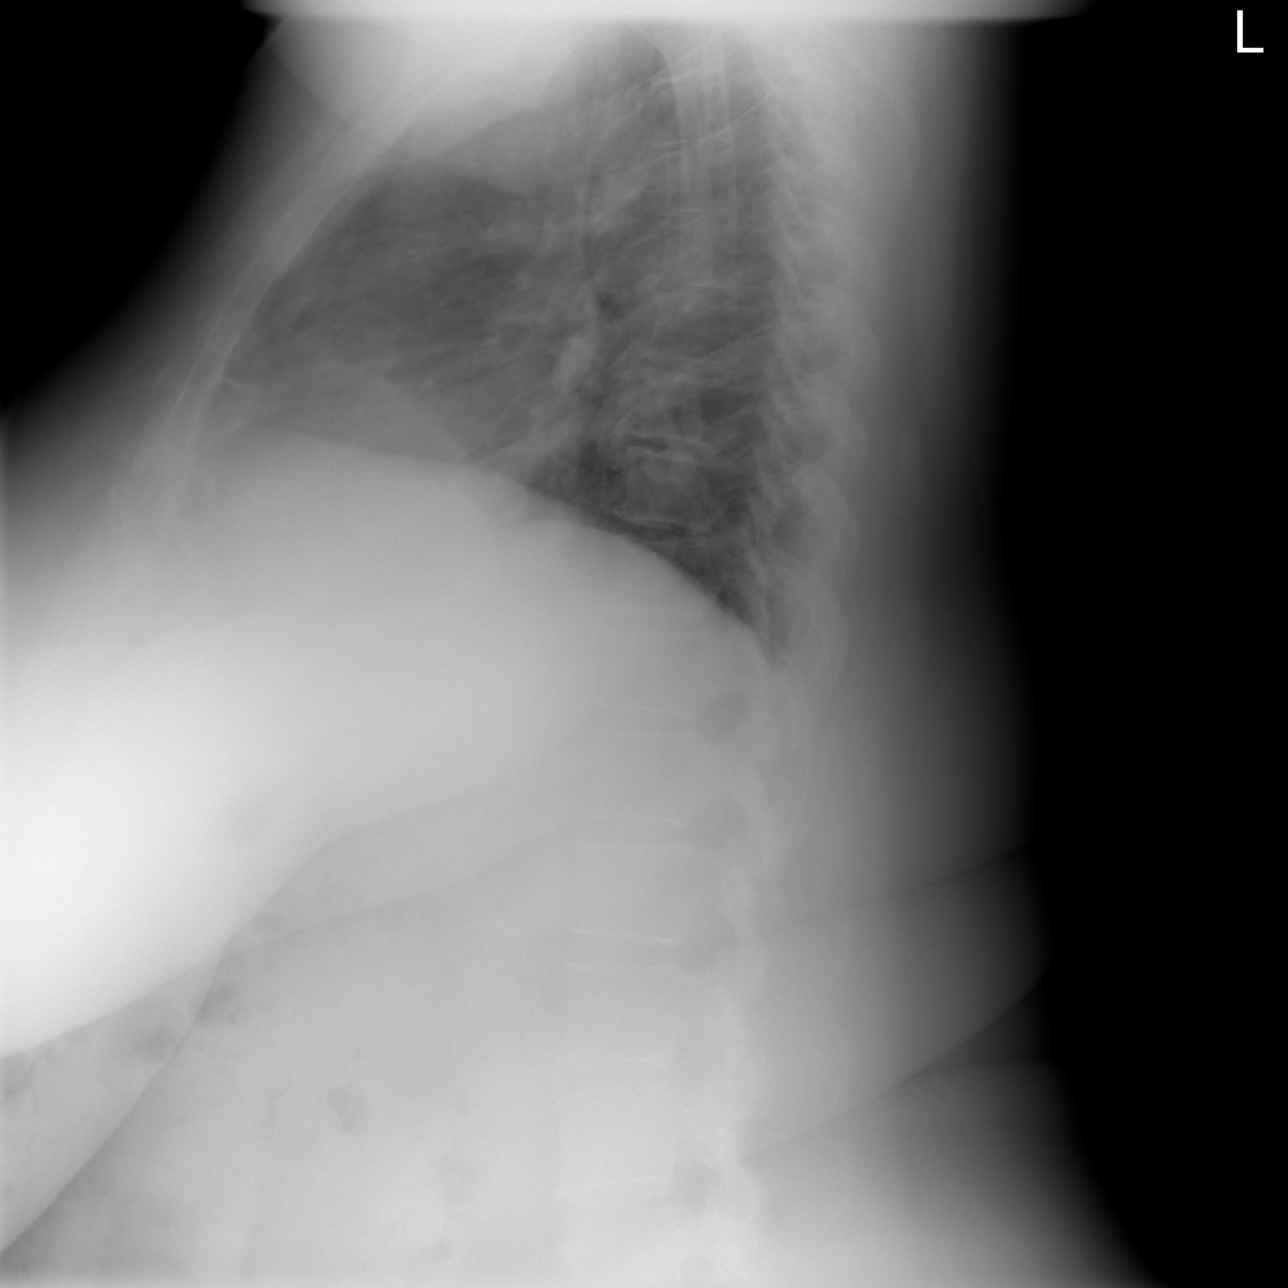

[w chest lat * (2 of 2)]
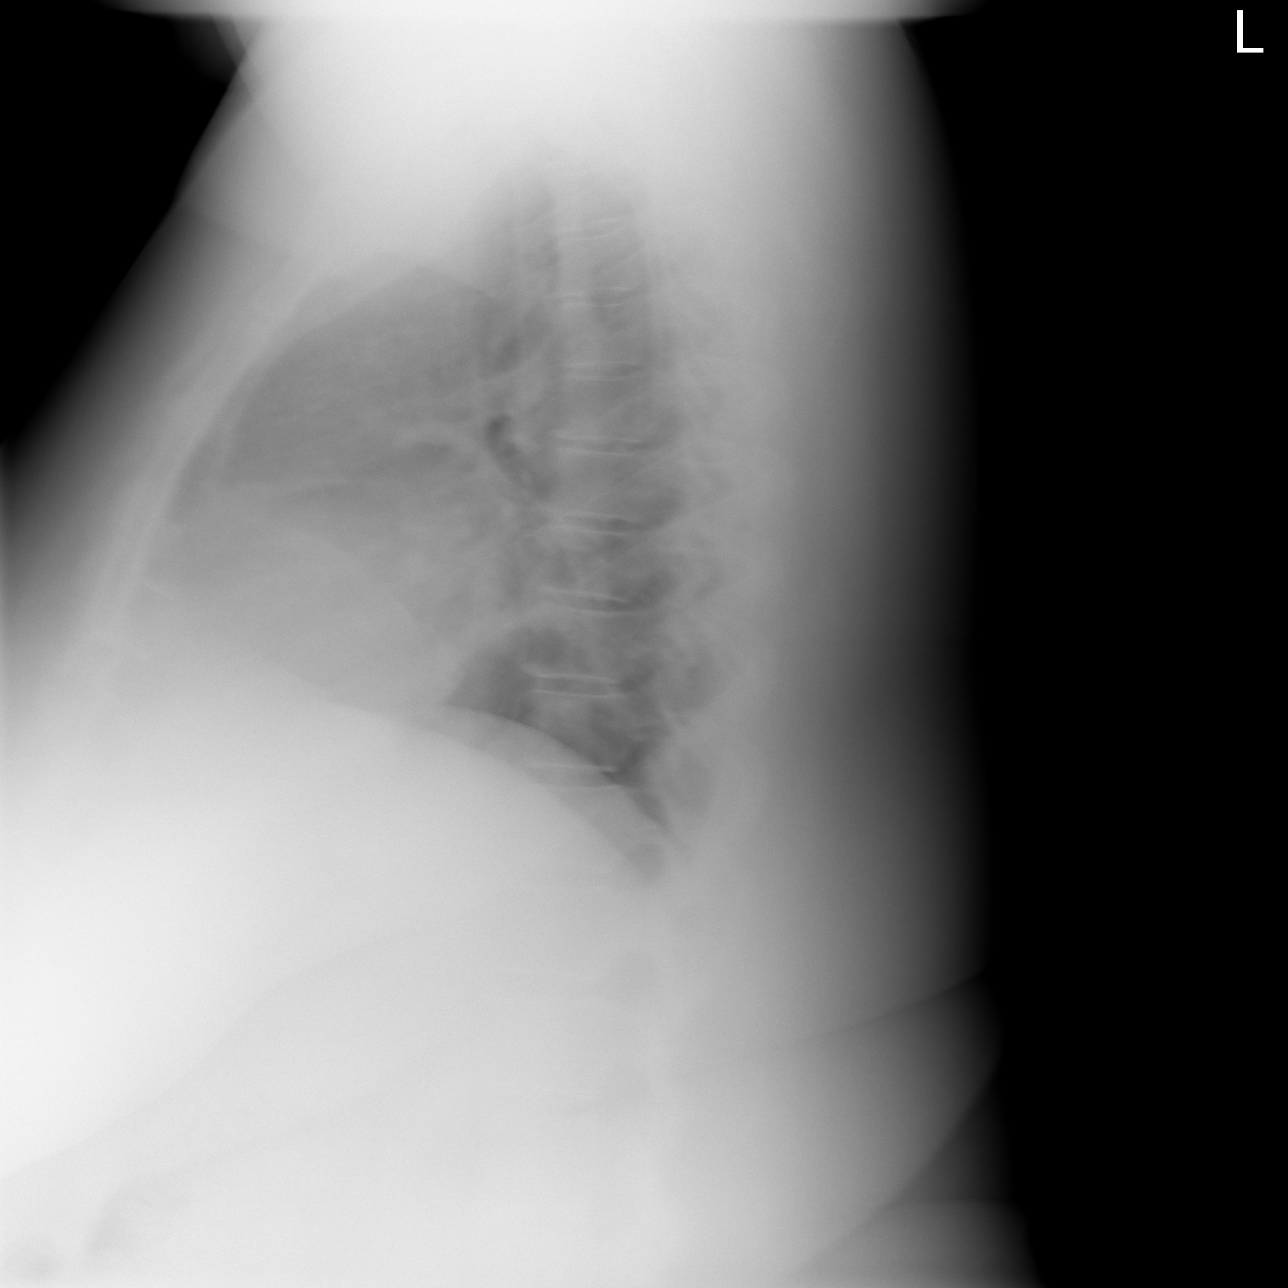

[3 of 3 positions shown; findings below may reference images not displayed]

FINDINGS: Inspiration suboptimal due to body habitus accounting for mild
atelectasis in the lower lobes. Pulmonary parenchyma otherwise clear. Heart
mildly enlarged but stable. Hilar and mediastinal contours otherwise
unremarkable. Mild degenerative changes noted in the thoracic spine.
IMPRESSION: Suboptimal inspiration causes mild bibasilar atelectasis. No acute
cardiopulmonary disease otherwise. Stable mild cardiomegaly.

## 2008-12-24 IMAGING — CR DG KNEE COMPLETE 4+V*R*
4 series · 4 of 4 positions shown · non-contrast
Comparison: 04/27/06.

CLINICAL DATA: 41-year-old with right knee pain after a fall. 
 RIGHT KNEE ? 4 VIEW:

[view not recorded (1 of 4)]
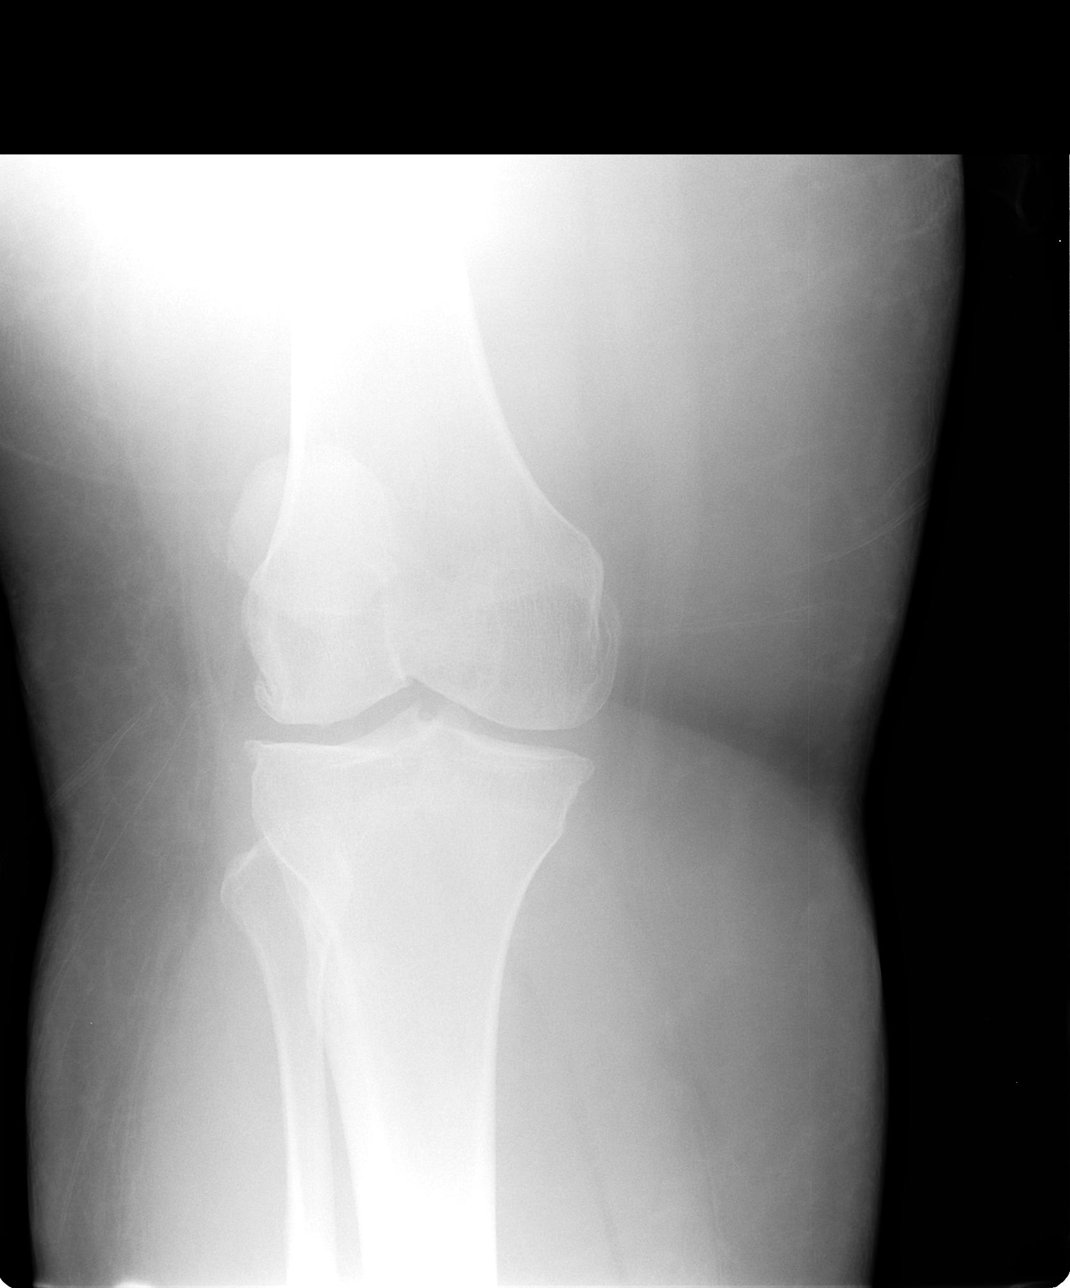

[view not recorded (2 of 4)]
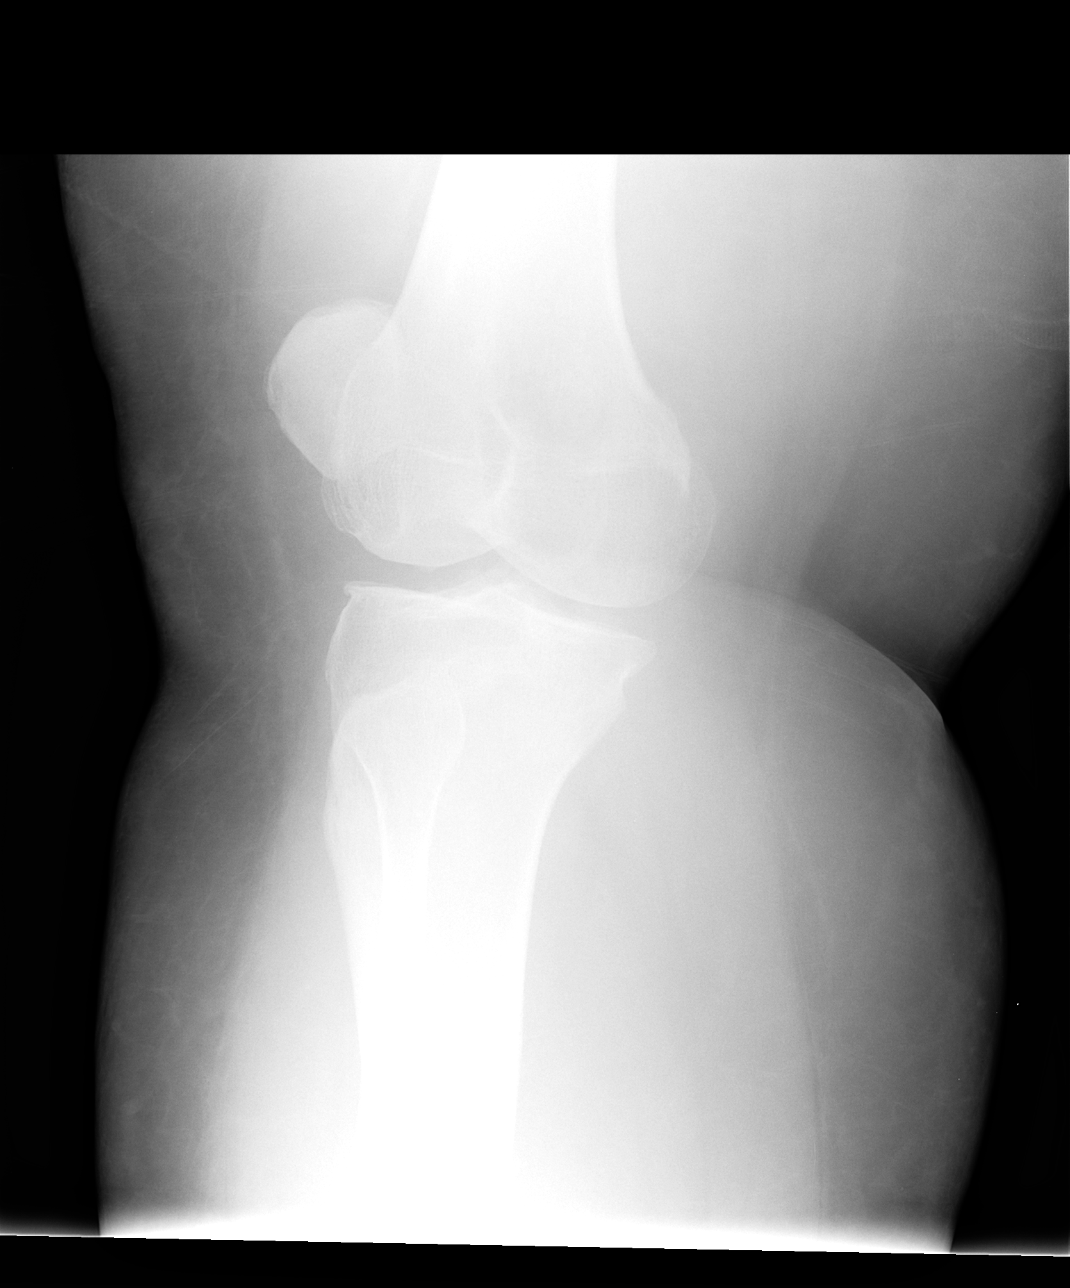

[view not recorded (3 of 4)]
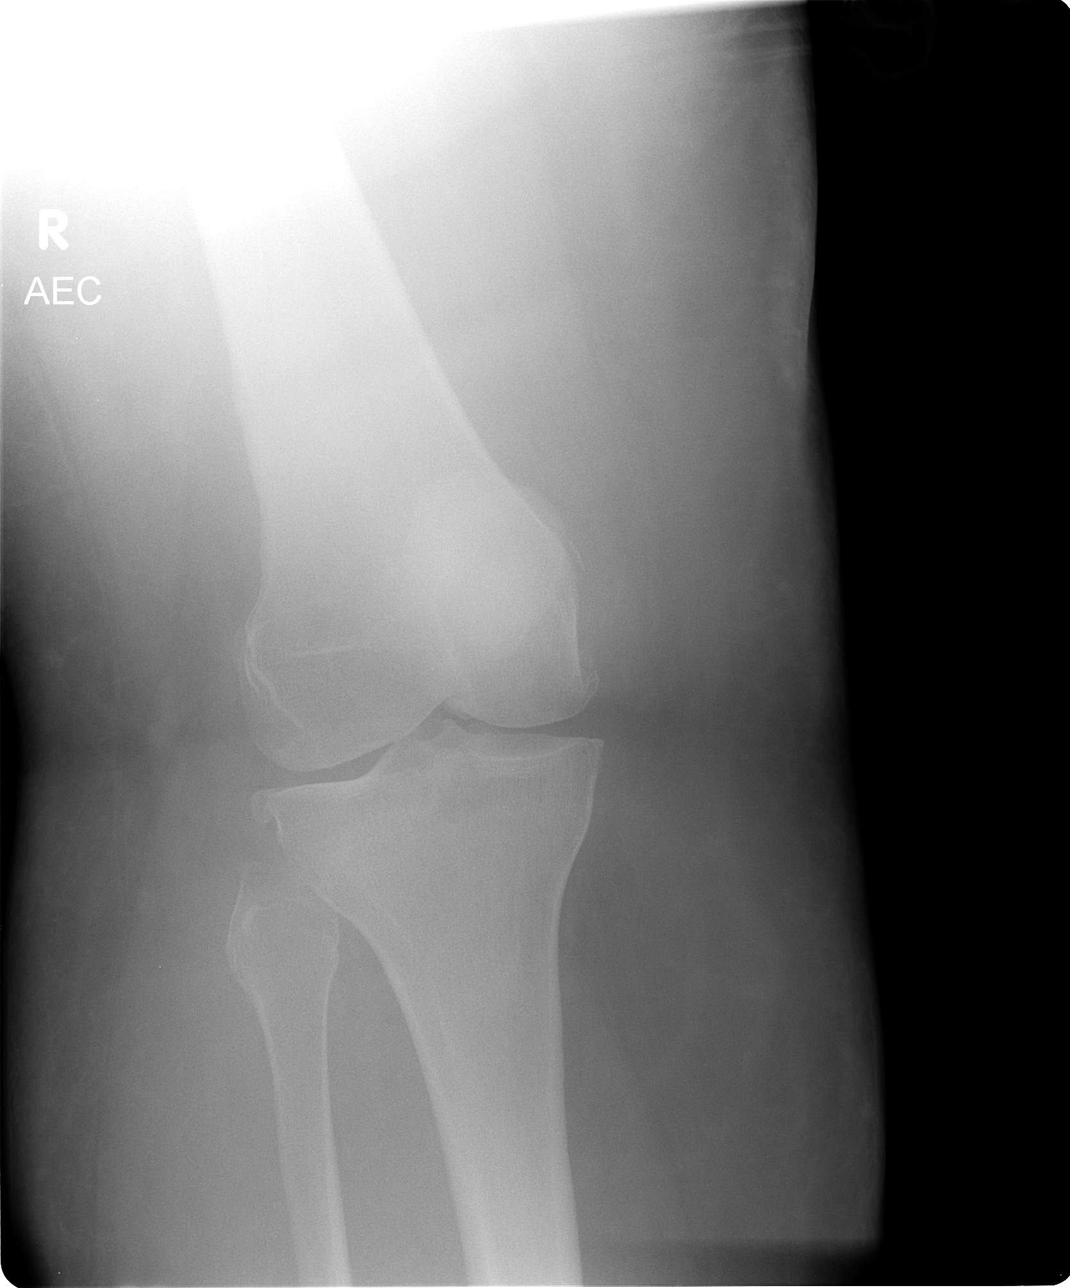

[view not recorded (4 of 4)]
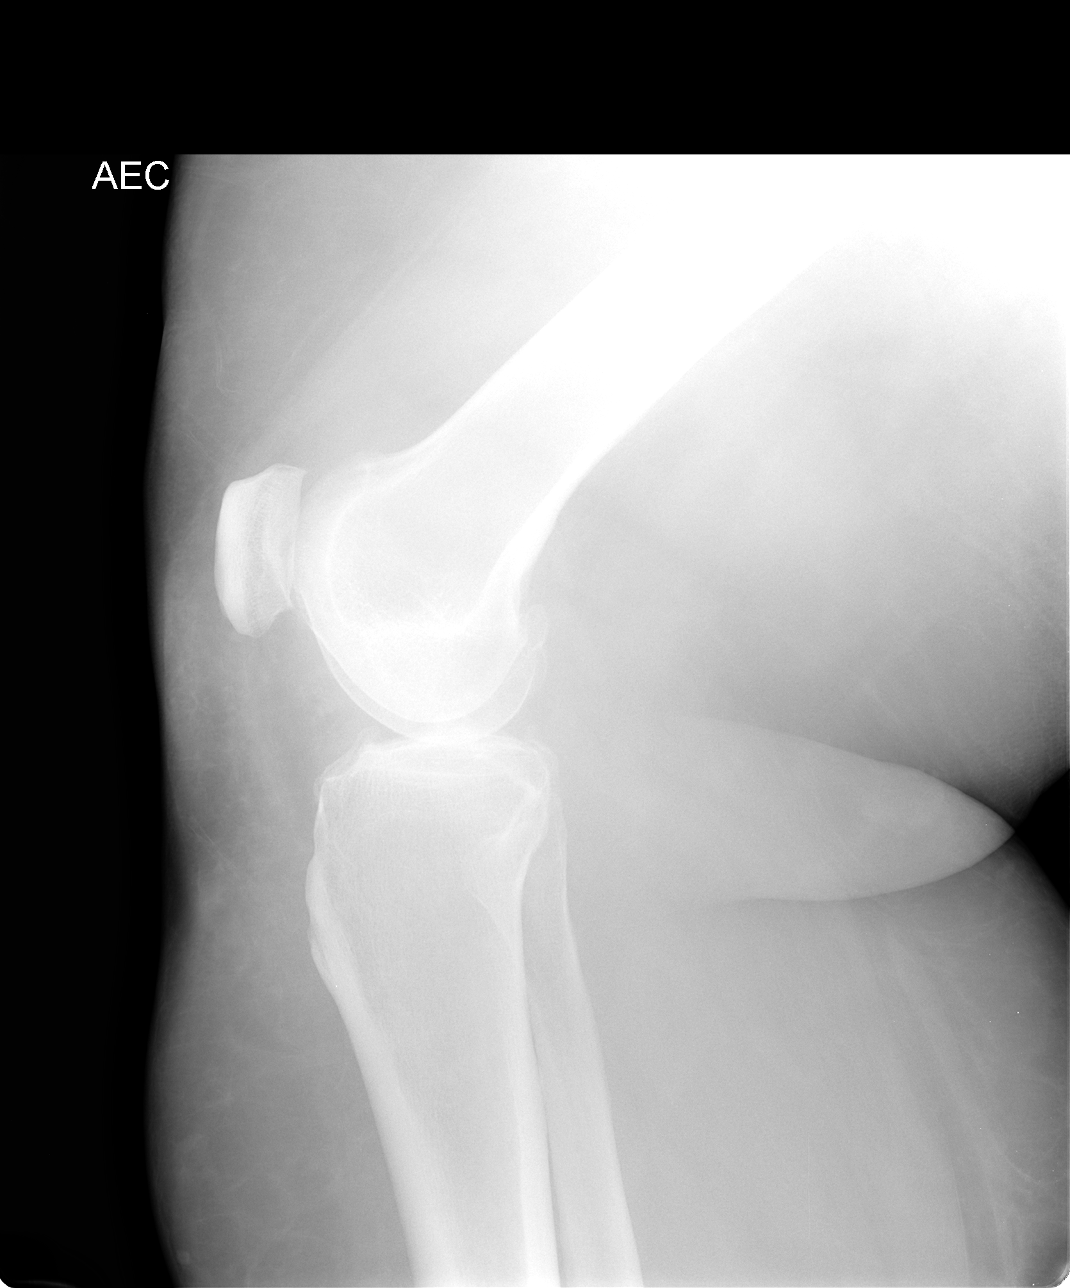

[4 of 4 positions shown; findings below may reference images not displayed]

FINDINGS: Moderately advanced degenerative change for the patient?s age.  Joint space narrowing, osteophytic spurring are stable findings.  No definite joint effusion.  No acute bony findings.
IMPRESSION: 1.  Stable degenerative changes.
 2.  No acute bony findings.

## 2008-12-27 ENCOUNTER — Ambulatory Visit: Payer: Self-pay | Admitting: Internal Medicine

## 2008-12-27 DIAGNOSIS — L0292 Furuncle, unspecified: Secondary | ICD-10-CM | POA: Insufficient documentation

## 2008-12-27 DIAGNOSIS — L0293 Carbuncle, unspecified: Secondary | ICD-10-CM

## 2008-12-27 DIAGNOSIS — G4700 Insomnia, unspecified: Secondary | ICD-10-CM

## 2008-12-31 IMAGING — CR DG CHEST 2V
2 series · 2 of 2 positions shown · non-contrast
Comparison: 06/14/06

CLINICAL DATA: Shortness of breath.  Chest pain.  Hypertension and morbid obesity.  
 CHEST - 2 VIEW:

[w chest pa]
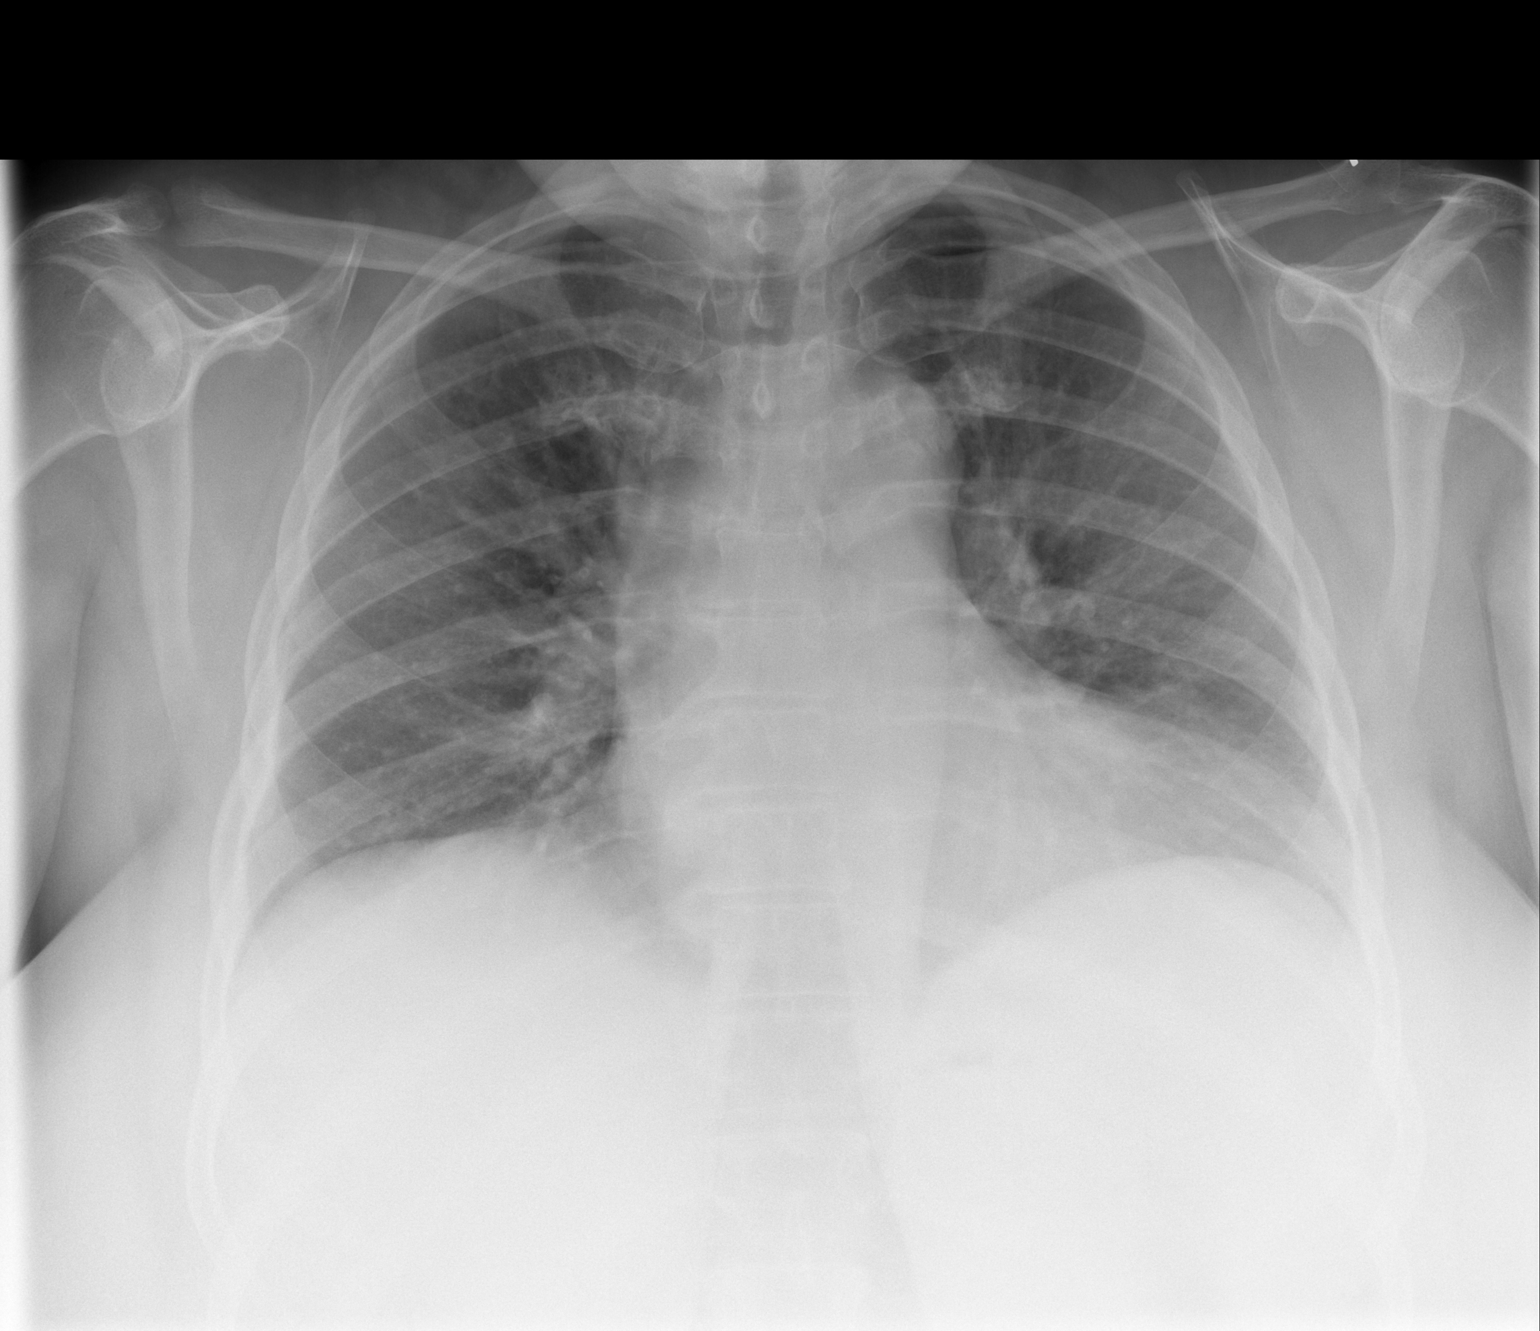

[w chest lat]
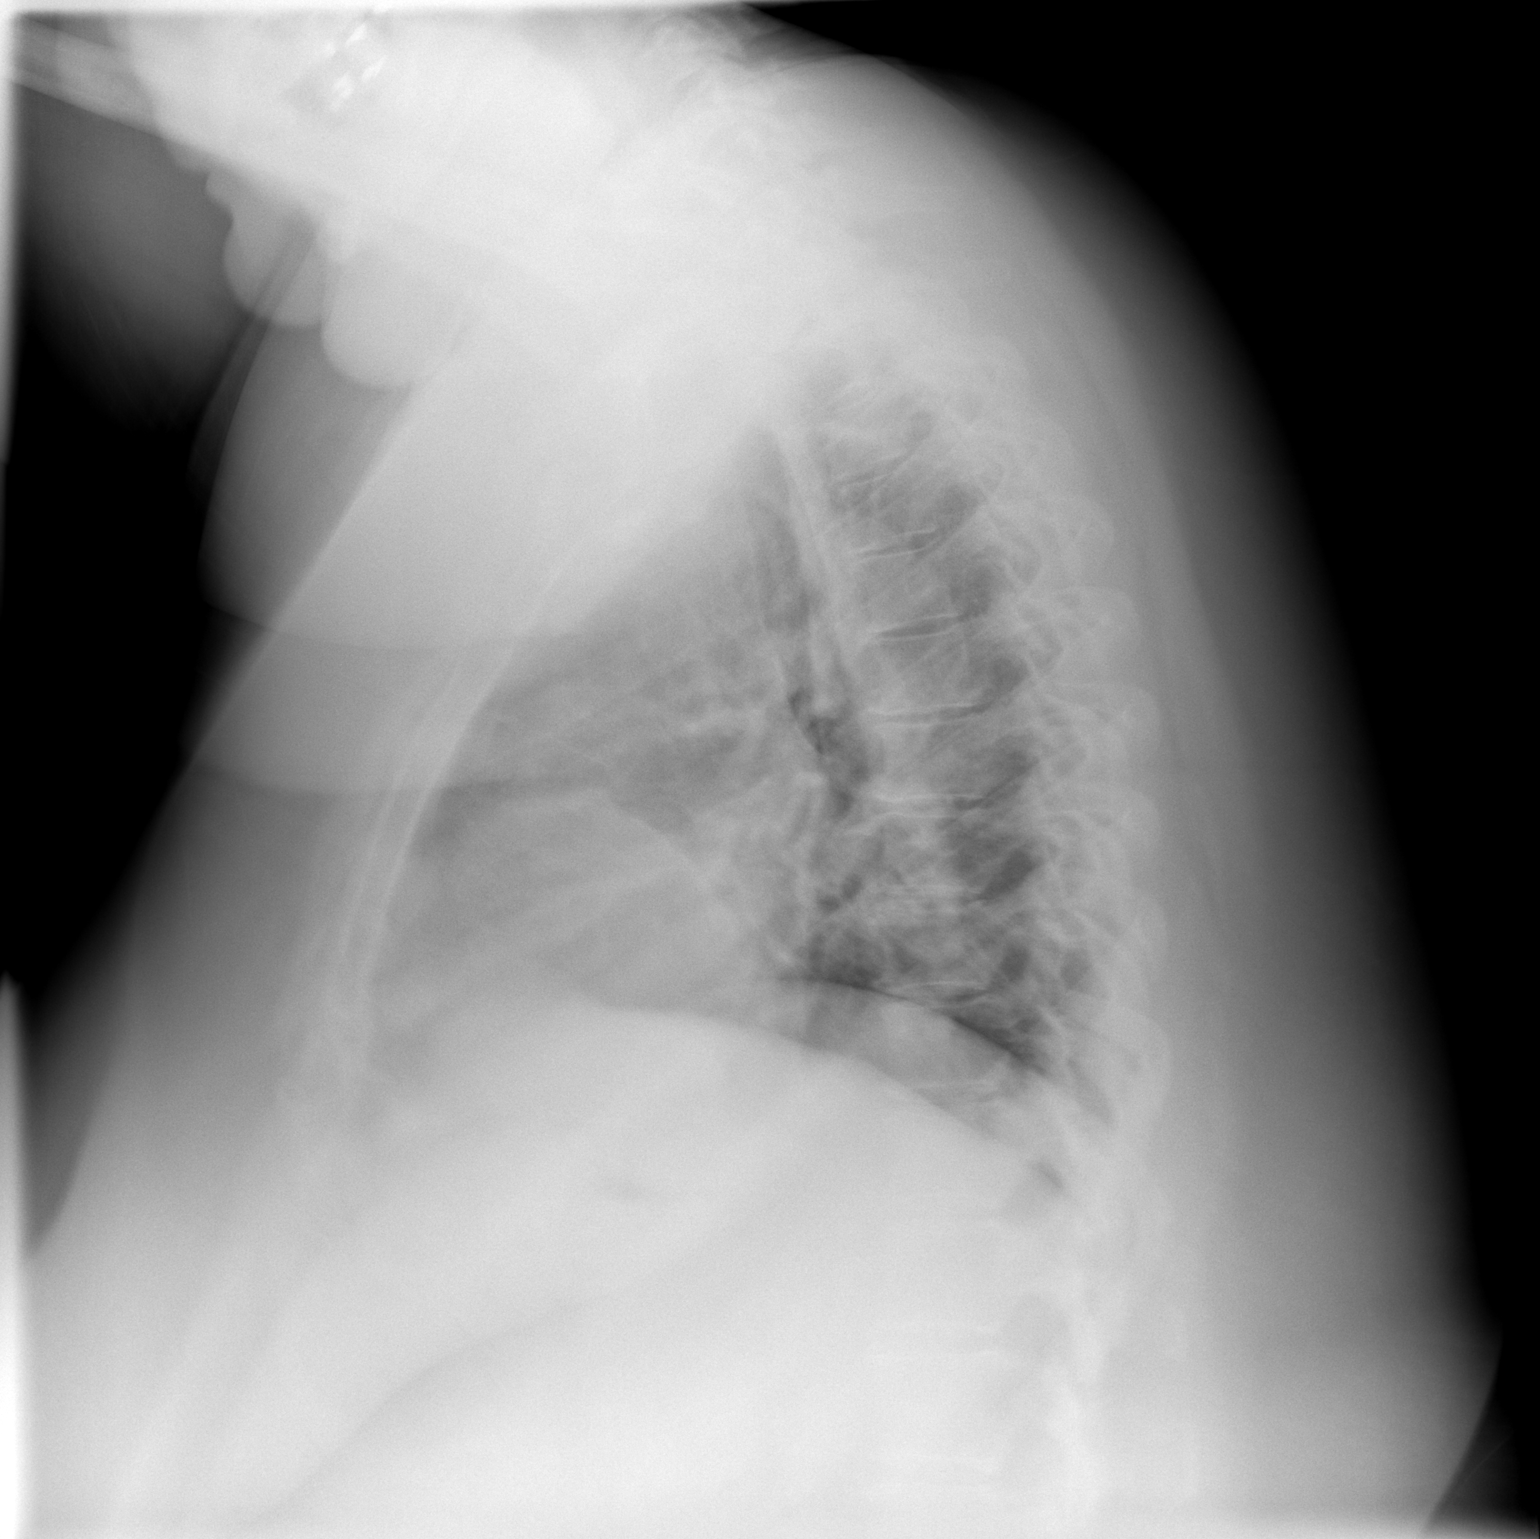

[2 of 2 positions shown; findings below may reference images not displayed]

FINDINGS: Compared to prior study earlier today, cardiomegaly is stable.  Low lung volumes are again noted.  Central peribronchial thickening is unchanged.  There is no evidence of pulmonary consolidation or pleural effusion.  No mass or adenopathy identified.
IMPRESSION: Stable cardiomegaly and low inspiratory lung volumes.  No acute findings.

## 2008-12-31 IMAGING — CR DG ABDOMEN 2V
6 series · 6 of 6 positions shown · non-contrast
Comparison: none

CLINICAL DATA: Dyspnea, constipation, abdominal pain.
 CHEST ? 2 VIEW:

[w abdomen decub * (1 of 3)]
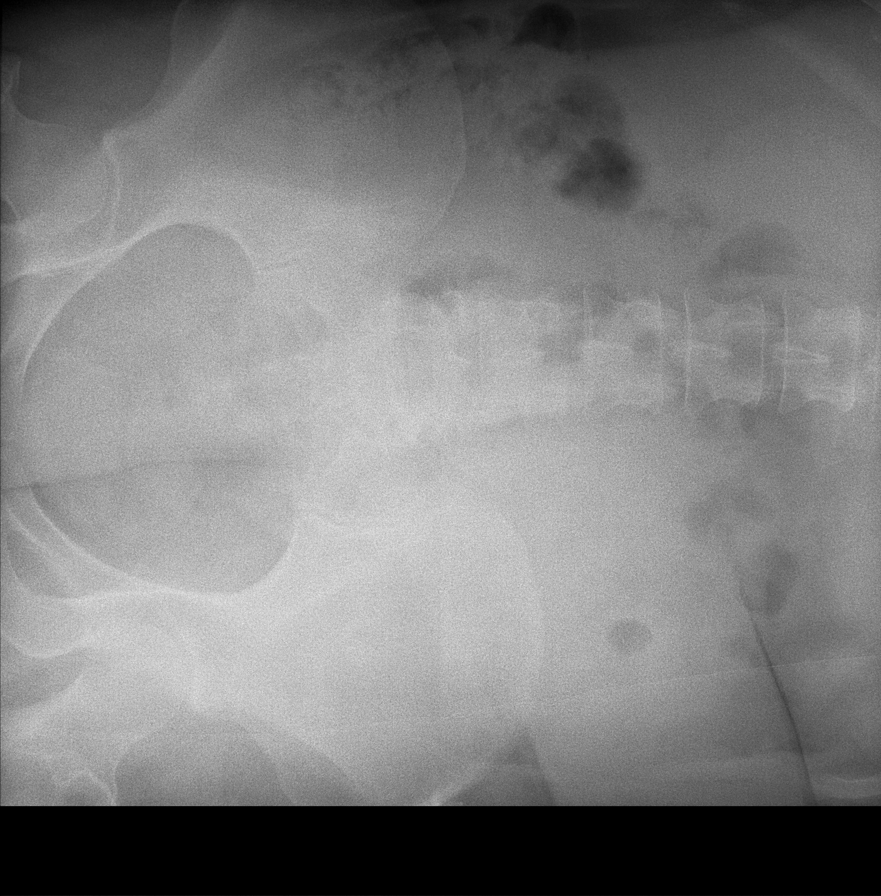

[w abdomen decub]
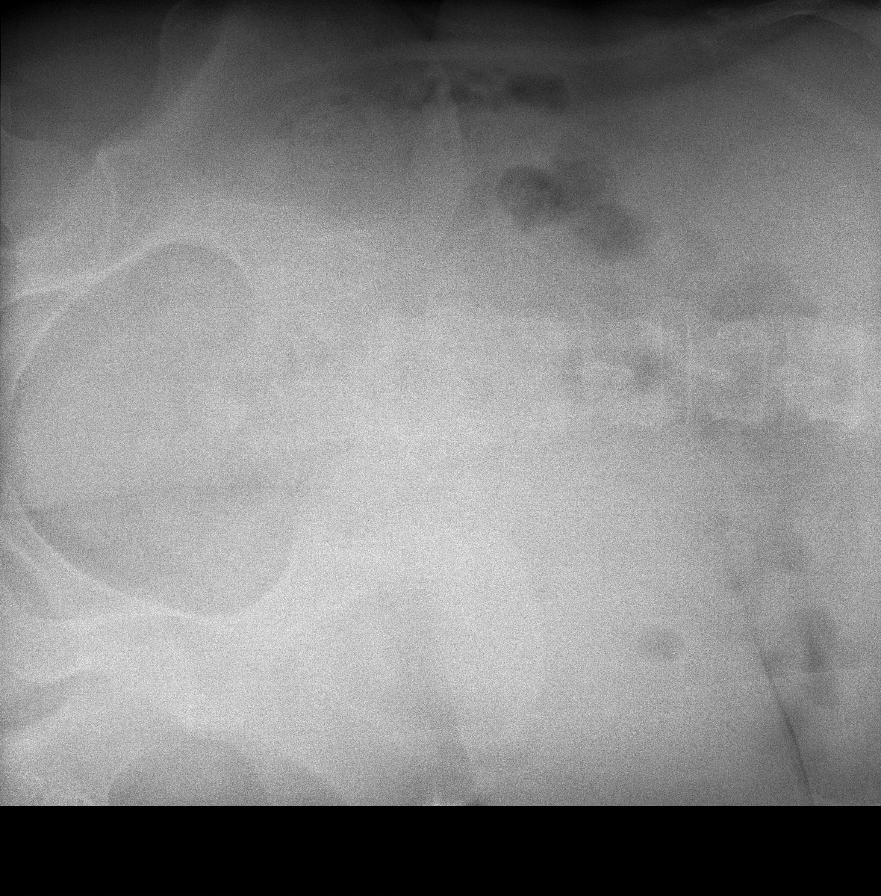

[w abdomen decub * (2 of 3)]
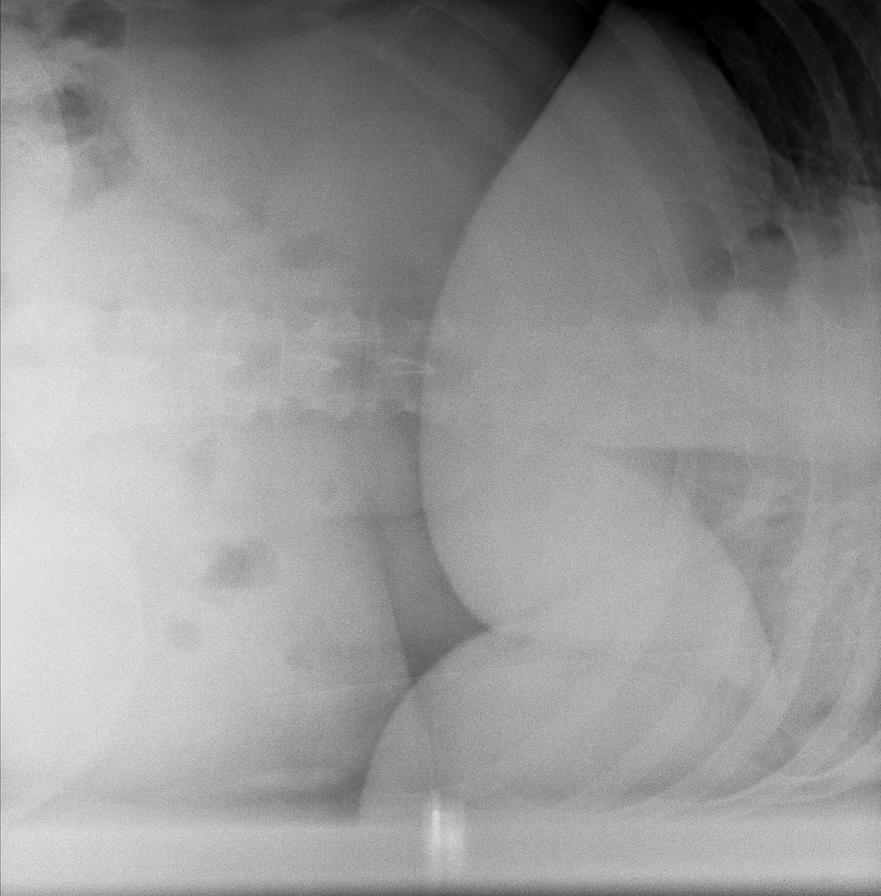

[w abdomen decub * (3 of 3)]
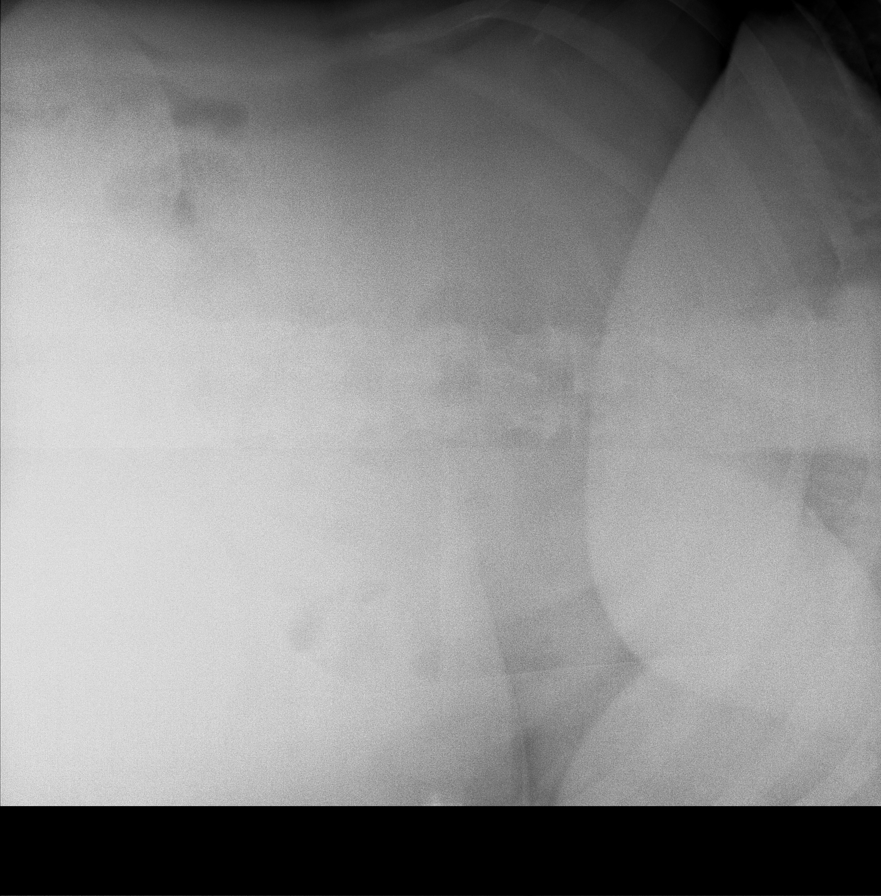

[t abdomen supine * (1 of 2)]
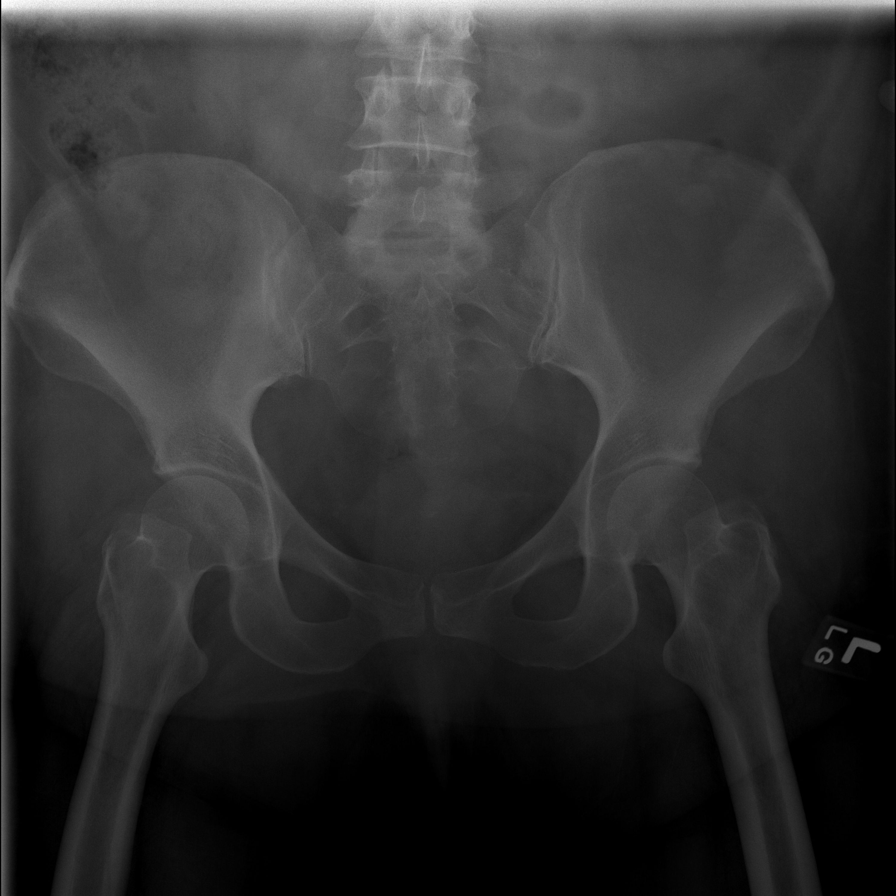

[t abdomen supine * (2 of 2)]
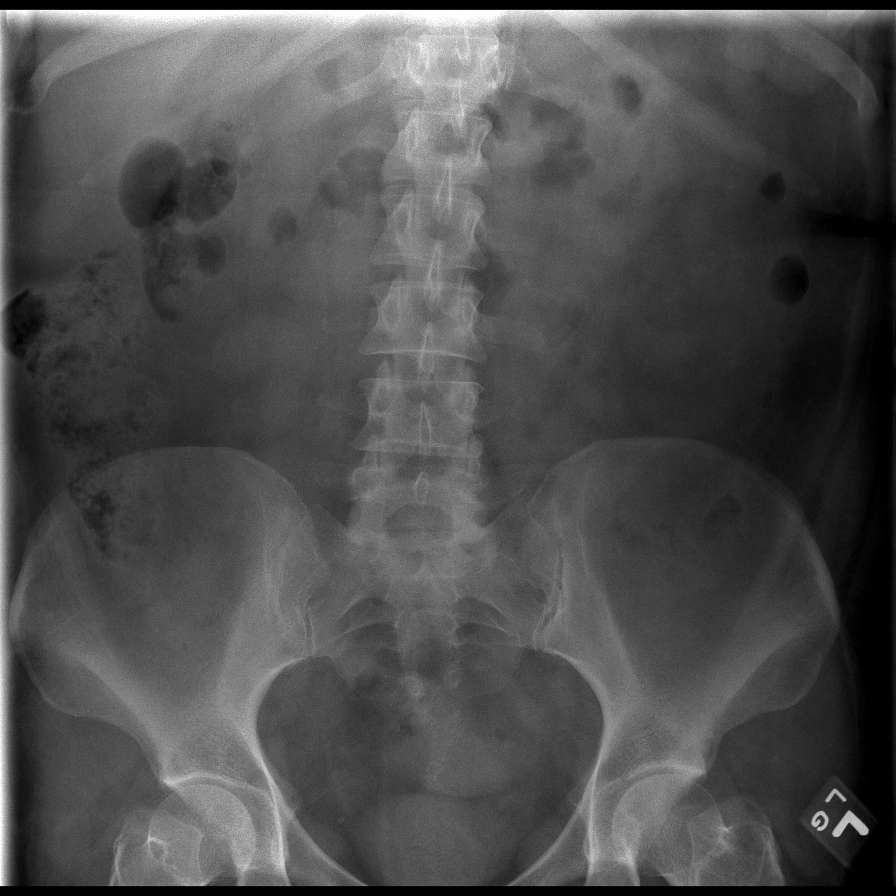

[6 of 6 positions shown; findings below may reference images not displayed]

FINDINGS: Cardiomegaly, low volume film, and mild peribronchial thickening are stable.  No pleural effusions or pneumothorax.  Stable bony thorax and upper abdomen.
IMPRESSION: Cardiomegaly and stable mild bronchitic changes. 
 ABDOMEN ? 2 VIEW:
FINDINGS: An unremarkable bowel gas pattern is noted without evidence of bowel obstruction or pneumoperitoneum.  Visualized bony structures are within normal limits except for mild degenerative changes in the lower lumbar spine.
IMPRESSION: Unremarkable bowel gas pattern.

## 2008-12-31 IMAGING — CR DG CHEST 2V
3 series · 3 of 3 positions shown · non-contrast
Comparison: none

CLINICAL DATA: Dyspnea, constipation, abdominal pain.
 CHEST ? 2 VIEW:

[w chest pa *]
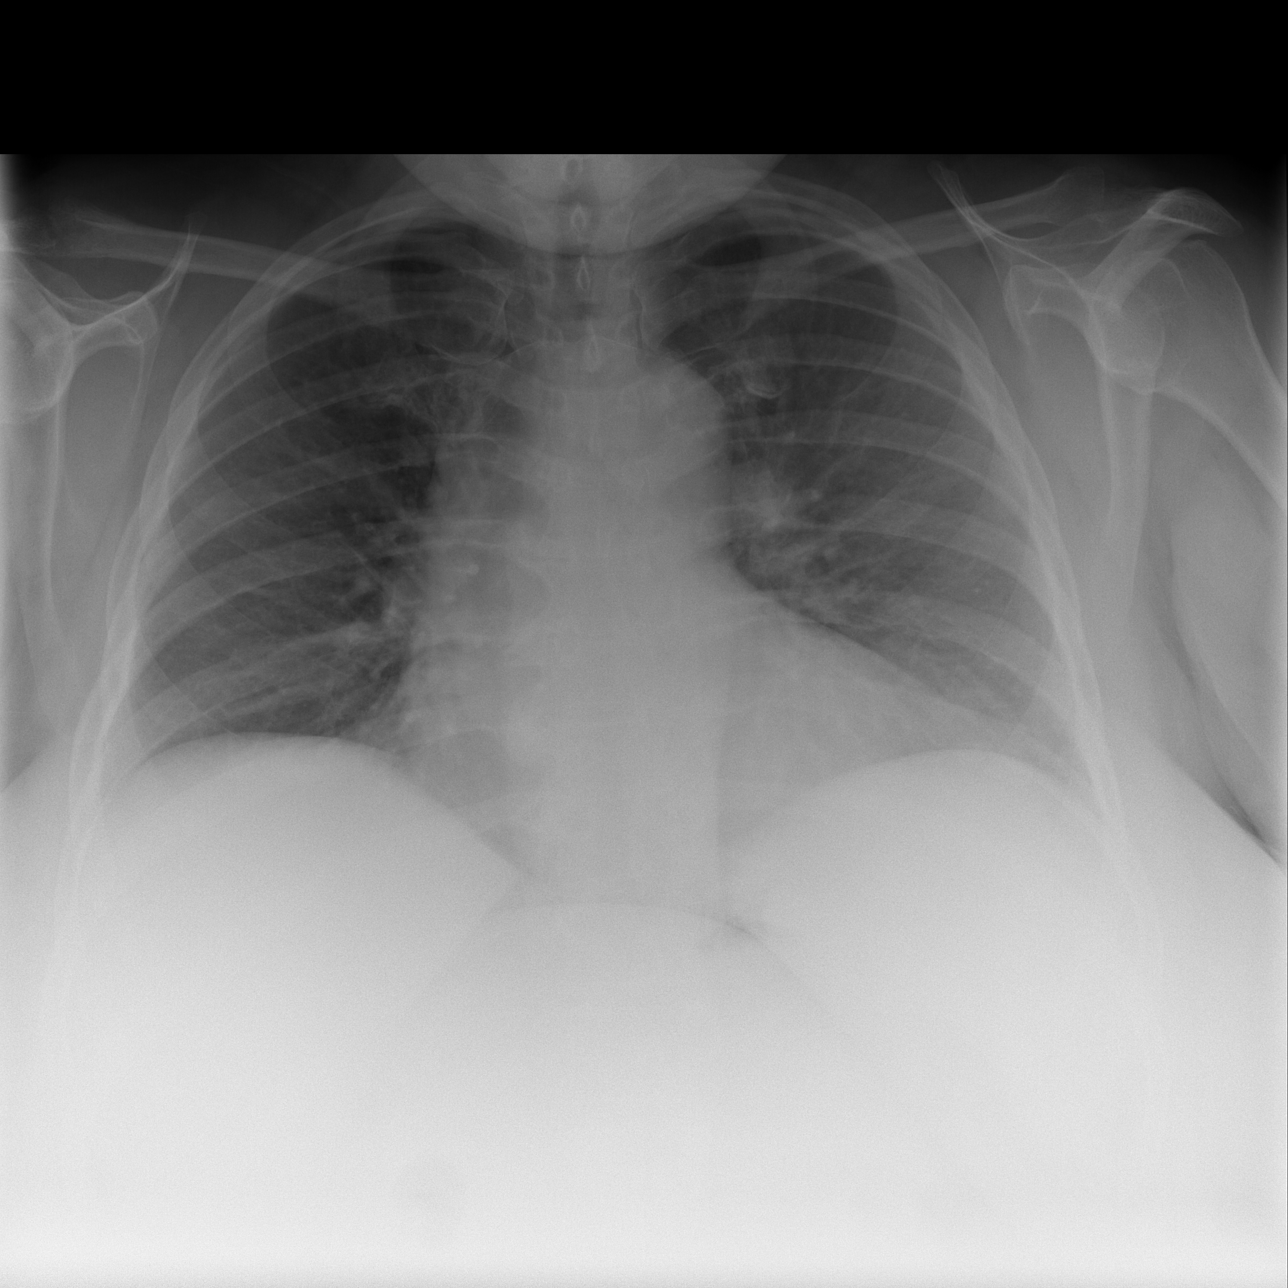

[w chest lat * (1 of 2)]
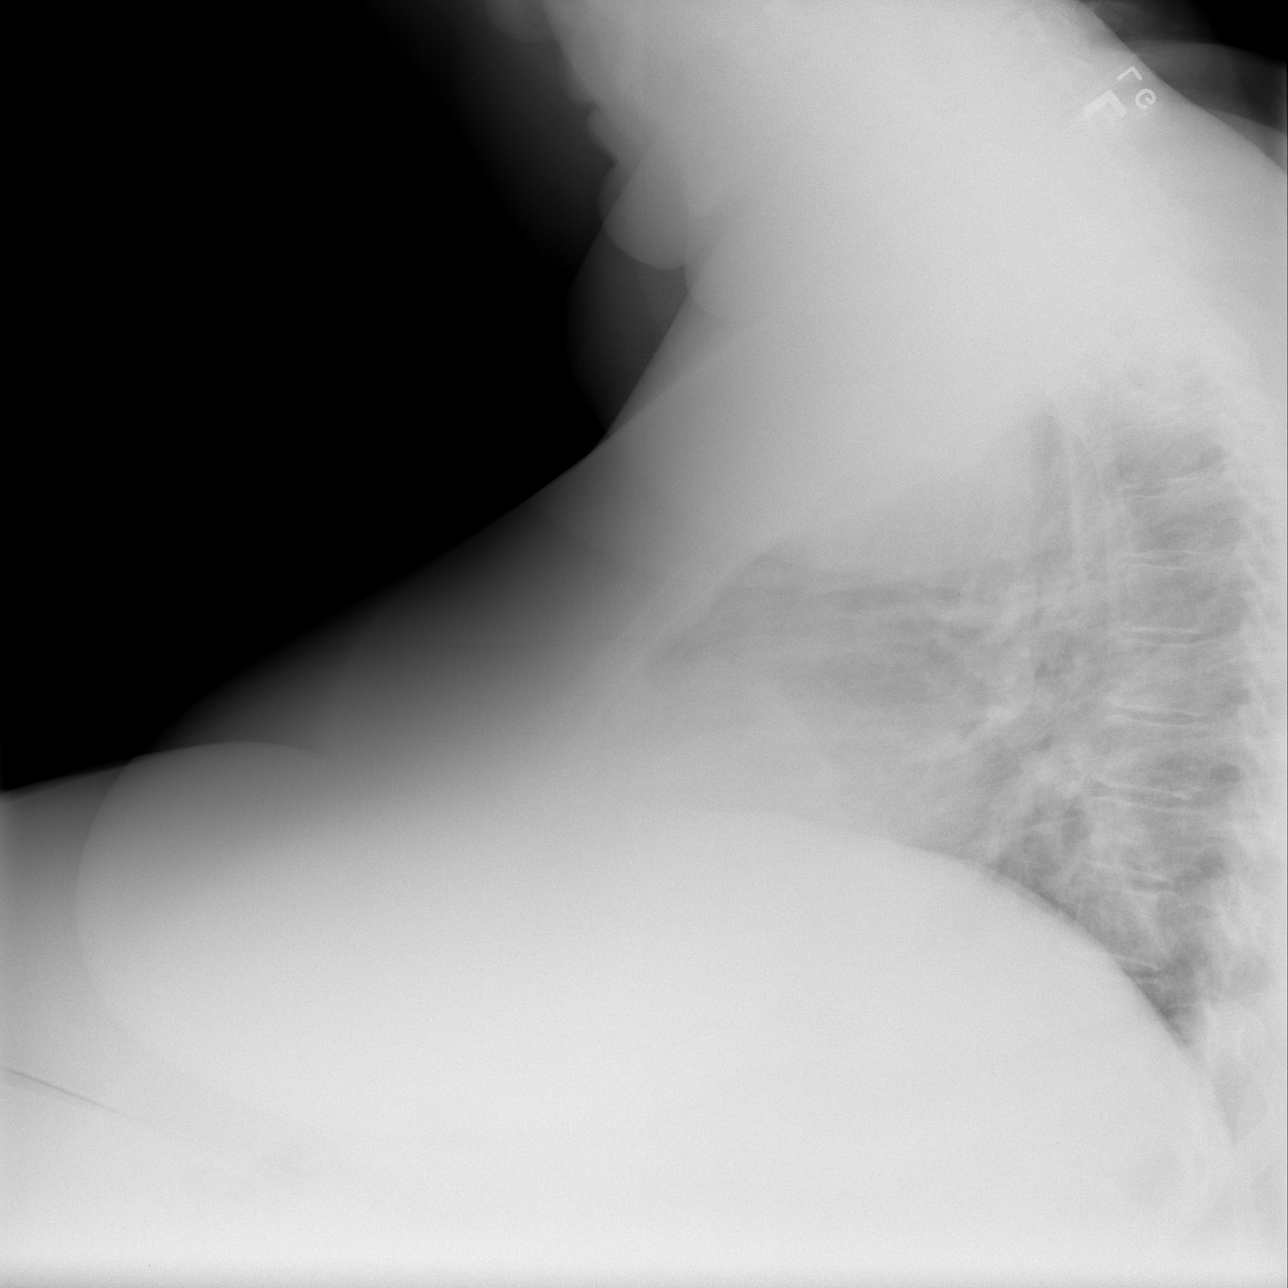

[w chest lat * (2 of 2)]
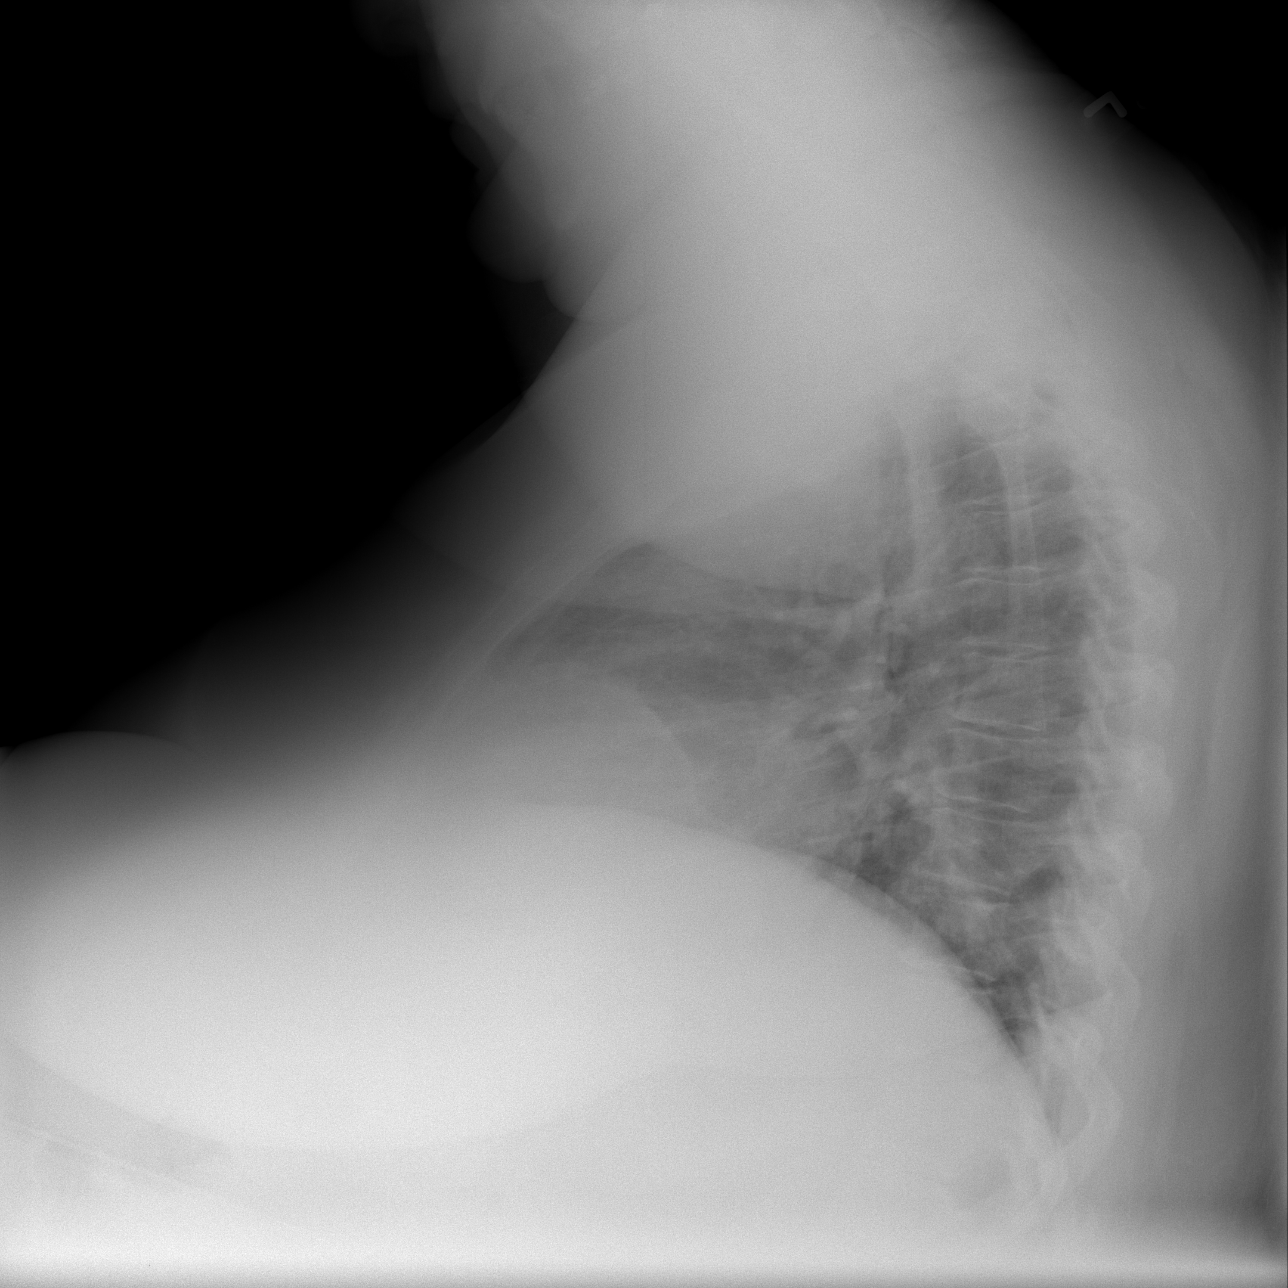

[3 of 3 positions shown; findings below may reference images not displayed]

FINDINGS: Cardiomegaly, low volume film, and mild peribronchial thickening are stable.  No pleural effusions or pneumothorax.  Stable bony thorax and upper abdomen.
IMPRESSION: Cardiomegaly and stable mild bronchitic changes. 
 ABDOMEN ? 2 VIEW:
FINDINGS: An unremarkable bowel gas pattern is noted without evidence of bowel obstruction or pneumoperitoneum.  Visualized bony structures are within normal limits except for mild degenerative changes in the lower lumbar spine.
IMPRESSION: Unremarkable bowel gas pattern.

## 2008-12-31 IMAGING — CR DG KNEE COMPLETE 4+V*R*
4 series · 4 of 4 positions shown · non-contrast
Comparison: 06/07/06

CLINICAL DATA: Worsening right knee pain.  Previous trauma.
 RIGHT KNEE ? 4 VIEW:

[t knee oblique right (1 of 3)]
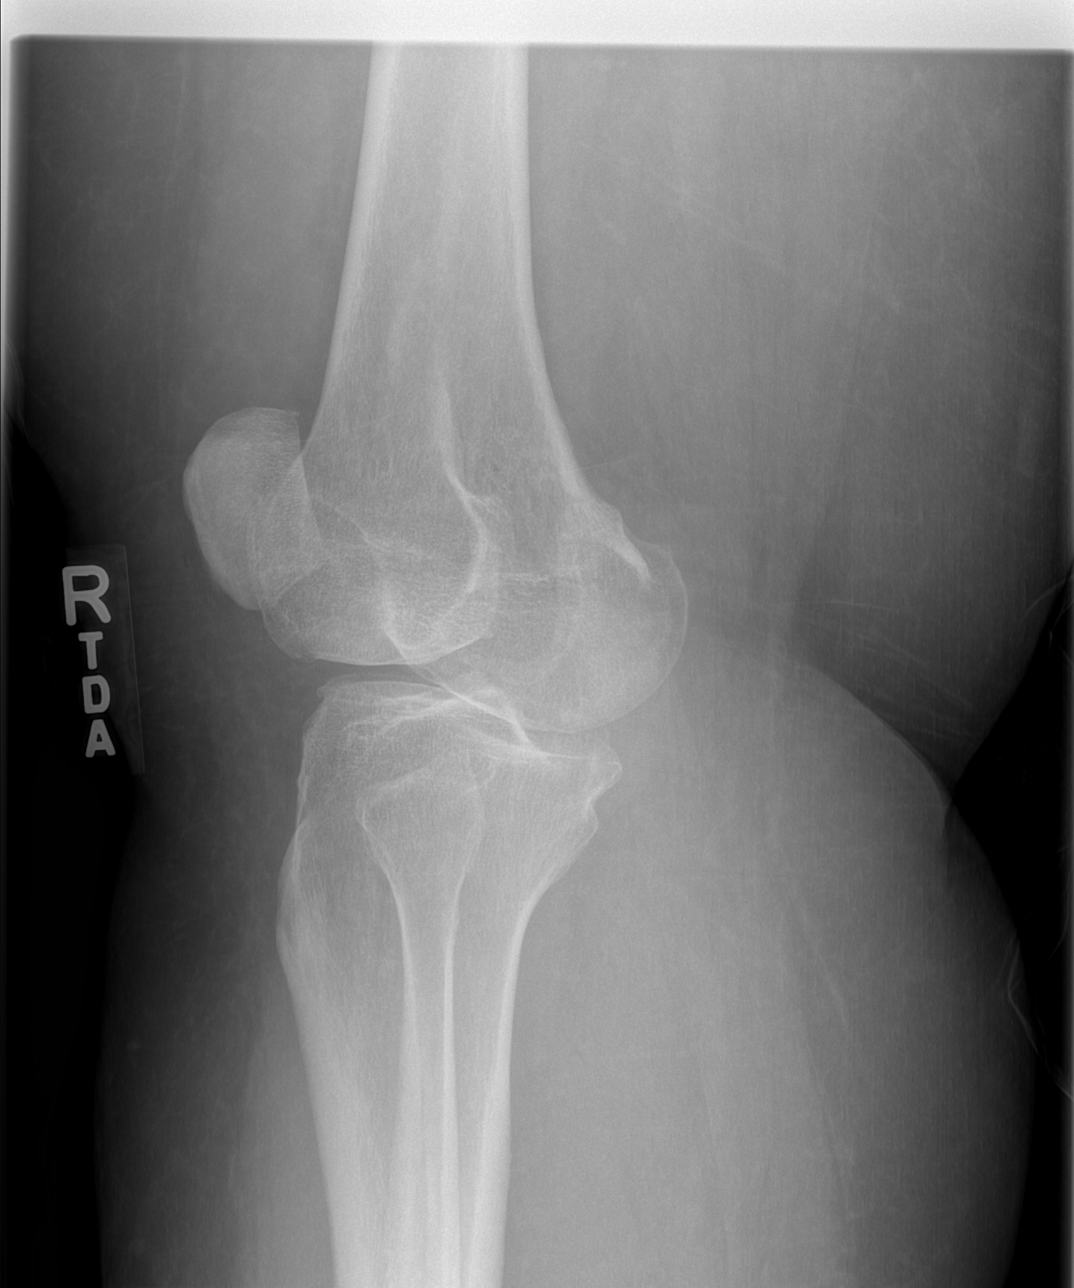

[t knee oblique right (2 of 3)]
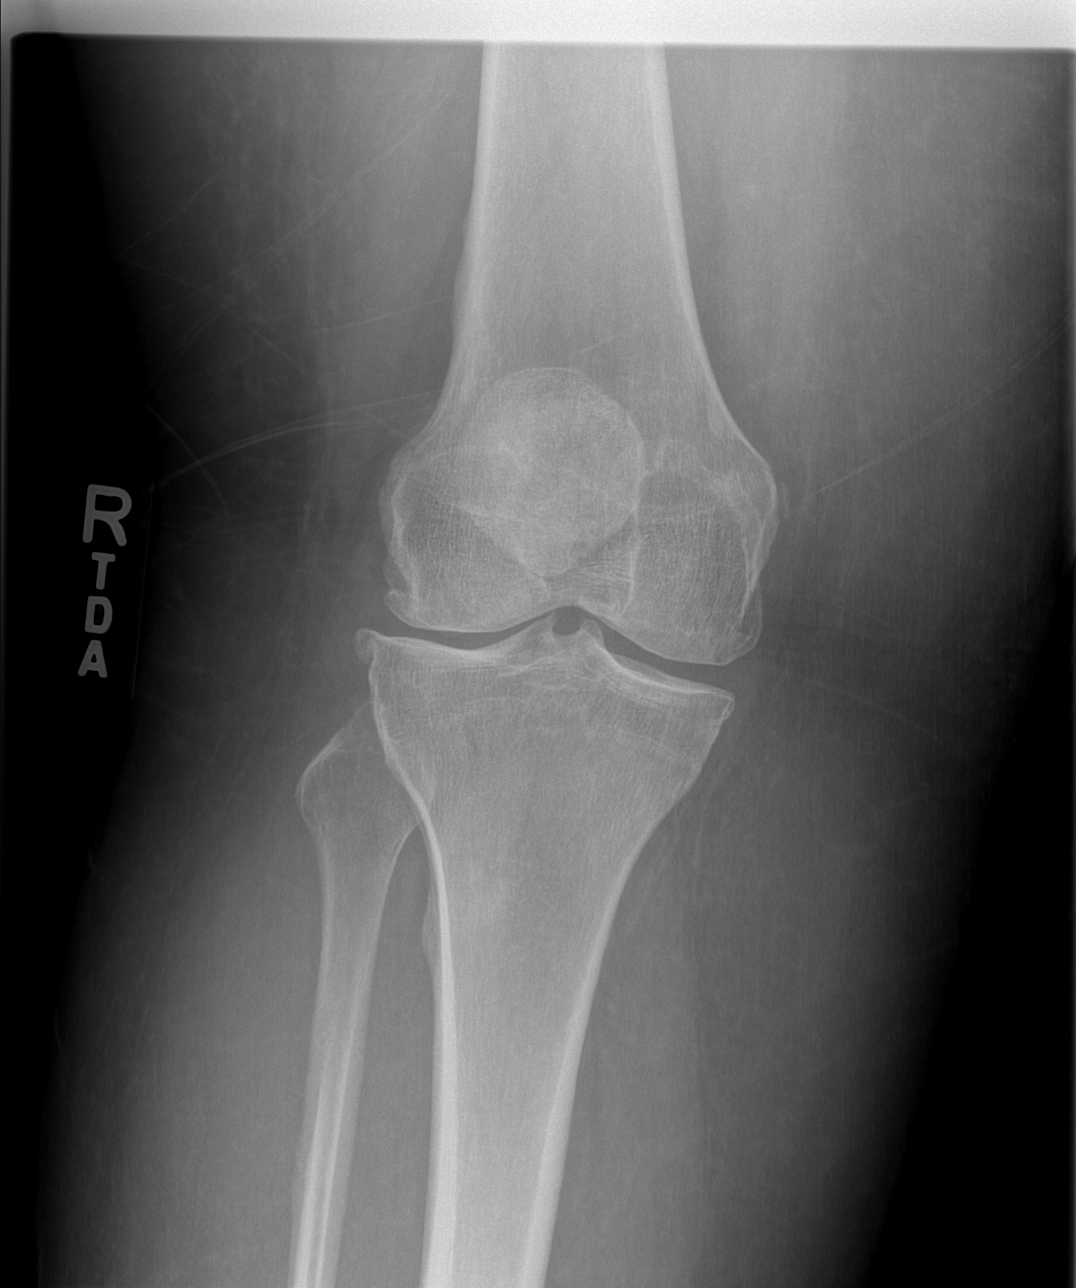

[t knee oblique right (3 of 3)]
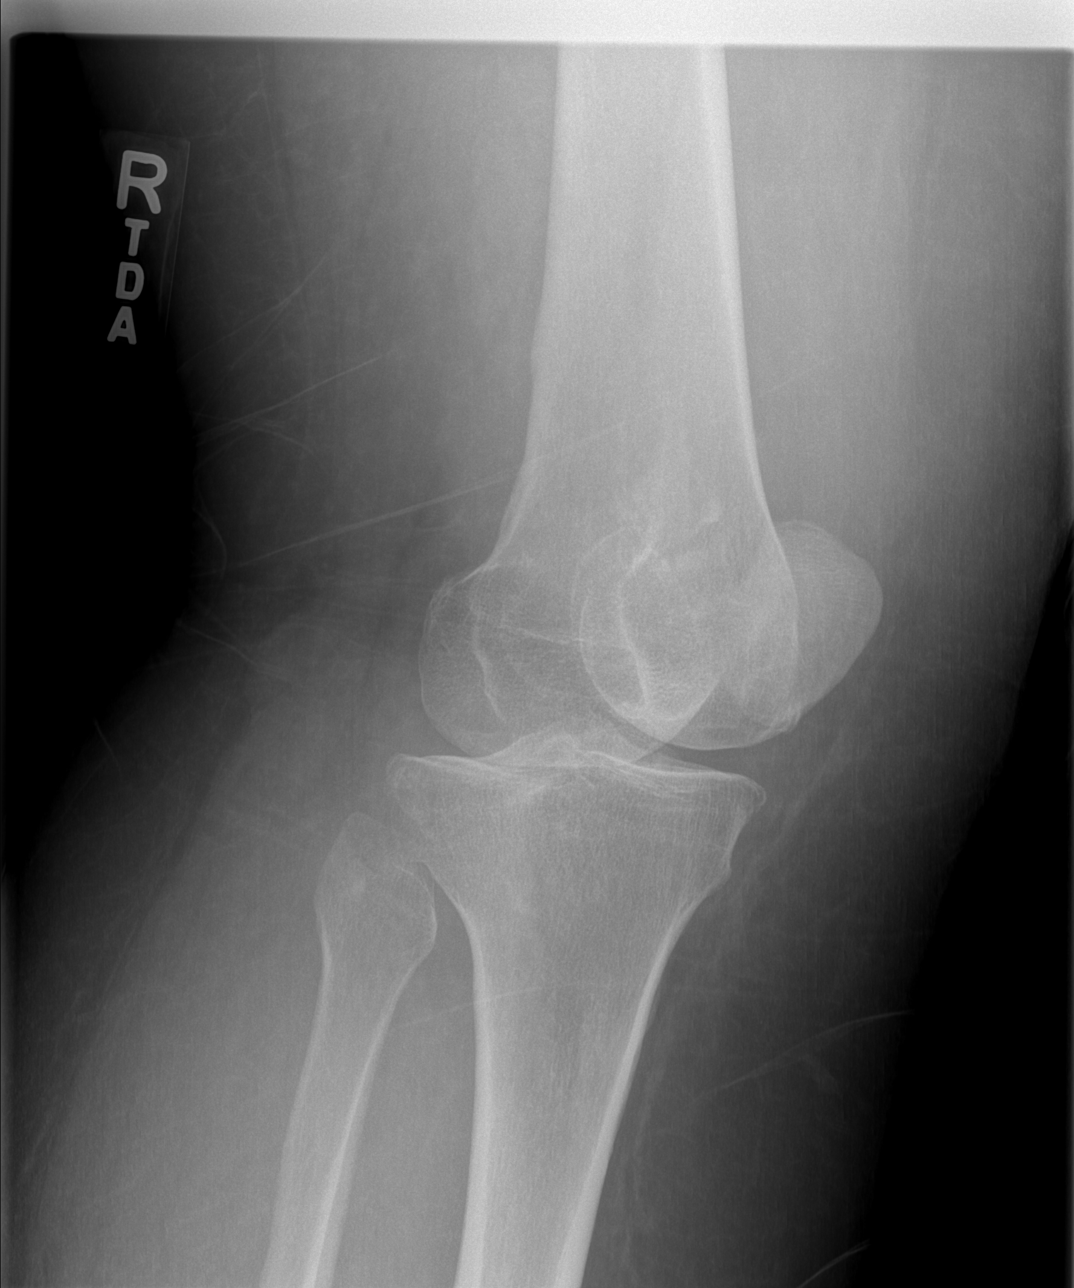

[t knee lat right]
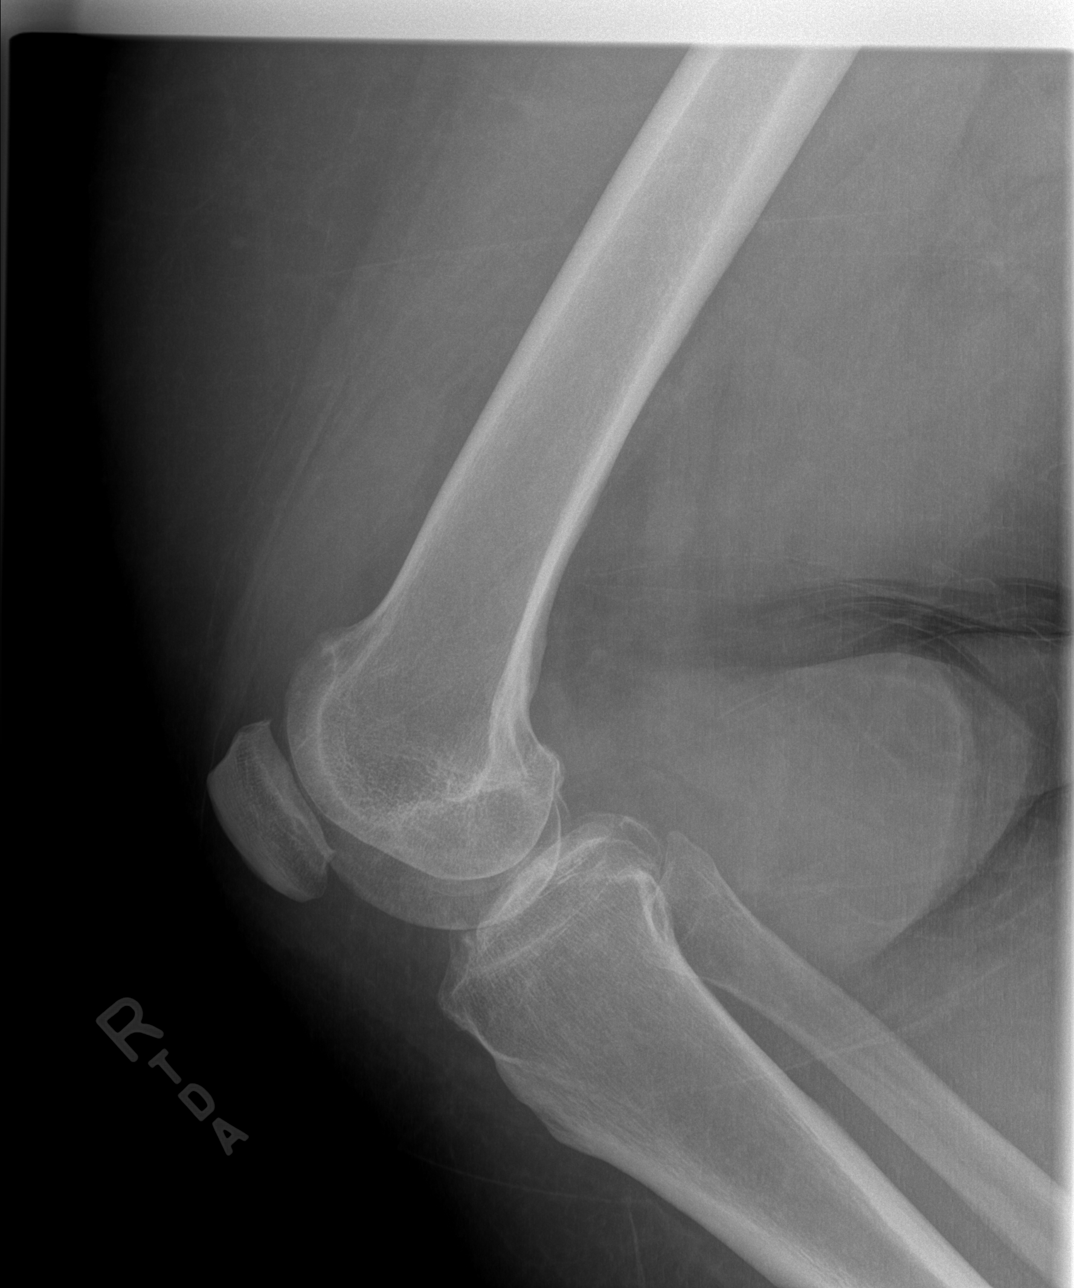

[4 of 4 positions shown; findings below may reference images not displayed]

FINDINGS: There is no evidence of fracture or dislocation.  There is no evidence of knee joint effusion.  Tricompartmental degenerative spurring is seen as well as mild lateral joint space narrowing.  These findings are unchanged compared with prior study.  Soft tissues are unremarkable.
IMPRESSION: 1.  No acute findings.
 2.  Tricompartmental osteoarthritis.

## 2009-01-01 IMAGING — CT CT ANGIO CHEST
2 series · 19 of 32 positions shown · IV contrast (APPLIED)
Comparison: none

CLINICAL DATA: Acute chest pain. 
CT ANGIOGRAPHY OF CHEST:
TECHNIQUE: Multidetector CT imaging of the chest was performed during bolus injection of intravenous contrast.  Multiplanar CT angiographic image reconstructions were generated to evaluate the vascular anatomy.
Contrast:  100 cc Omnipaque 300.

[Series 4: pulm embolism 2.0 b31f st · axial · 0.70mm/px · z∈[-336,-86]mm · 16 of 143 slices shown]
[im 9/143  lung]
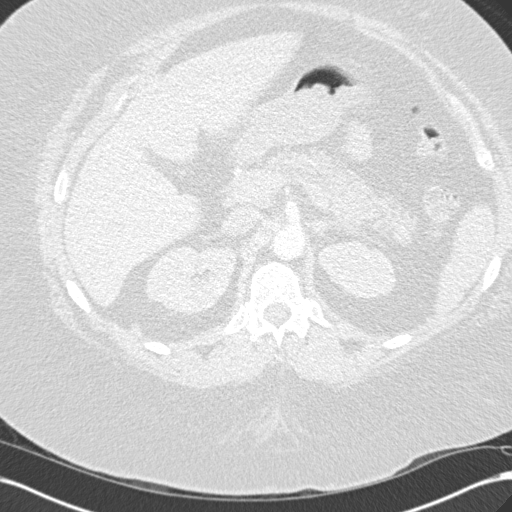
[im 17/143  soft-tissue]
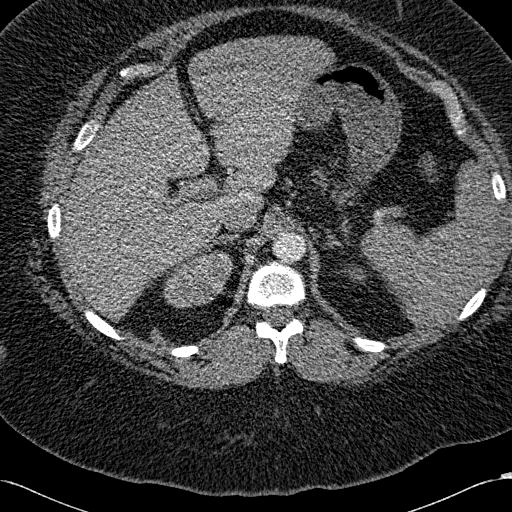
[im 26/143  lung]
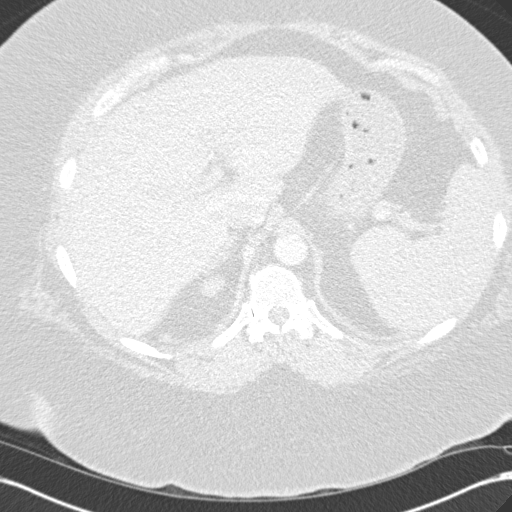
[im 34/143  soft-tissue]
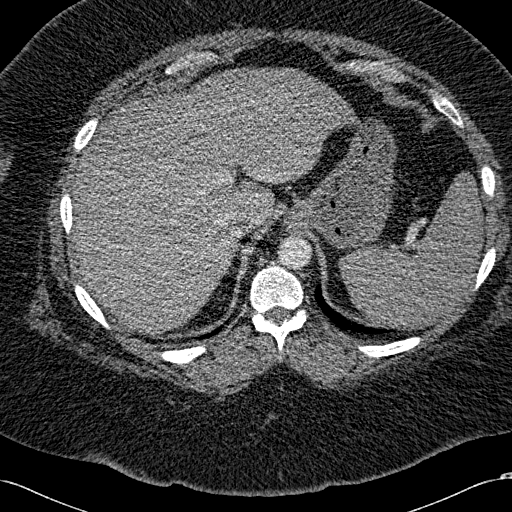
[im 42/143  lung]
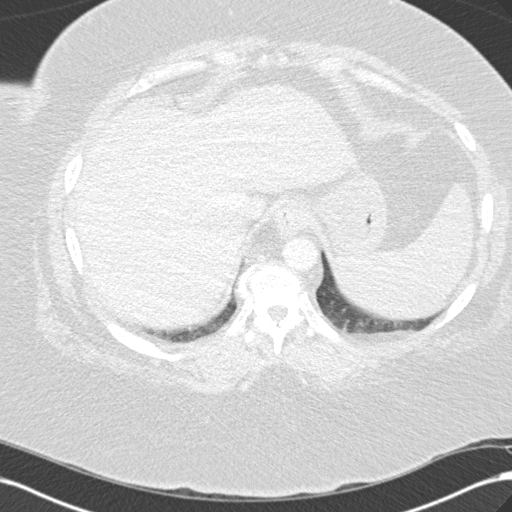
[im 51/143  soft-tissue]
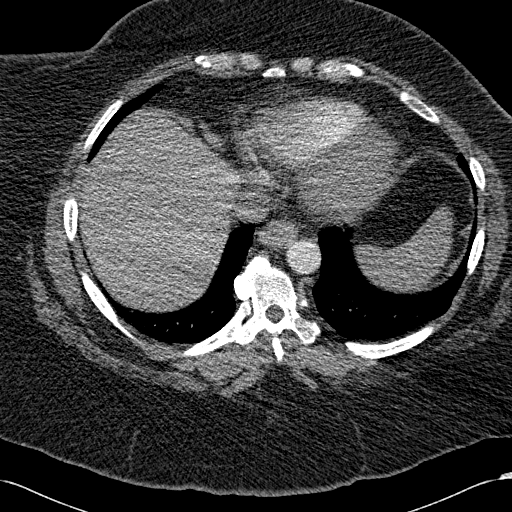
[im 59/143  lung]
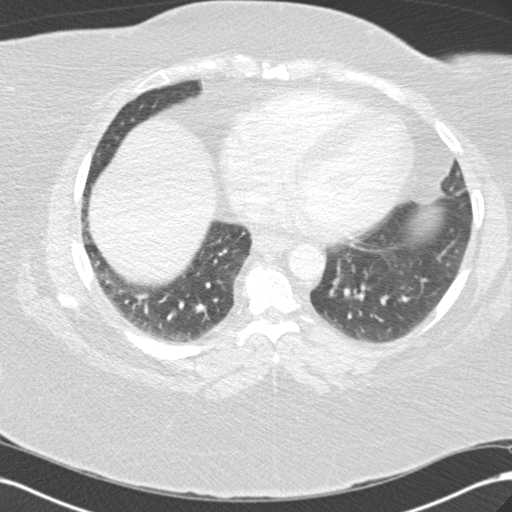
[im 67/143  soft-tissue]
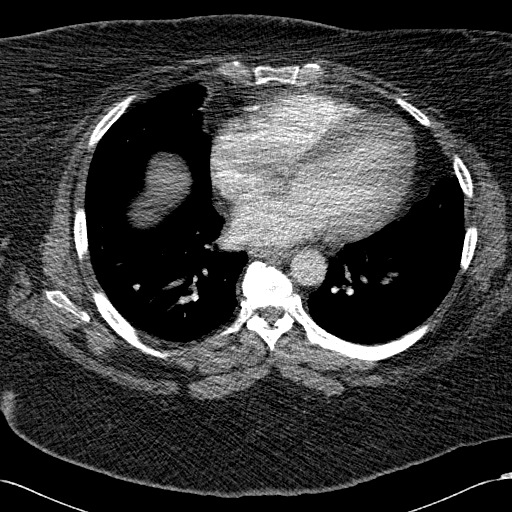
[im 76/143  lung]
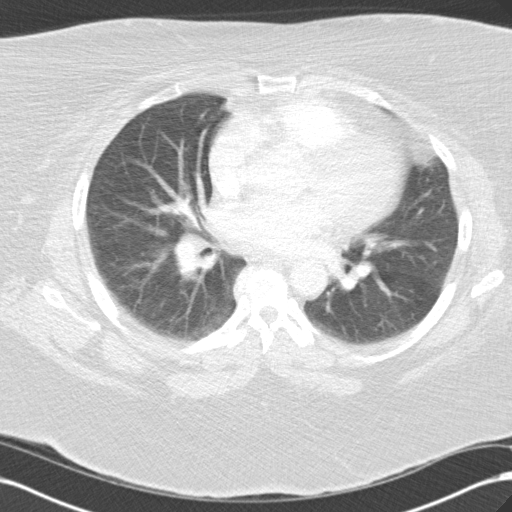
[im 84/143  soft-tissue]
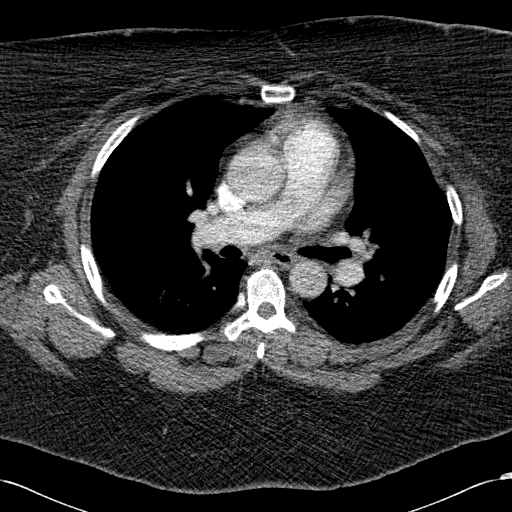
[im 92/143  lung]
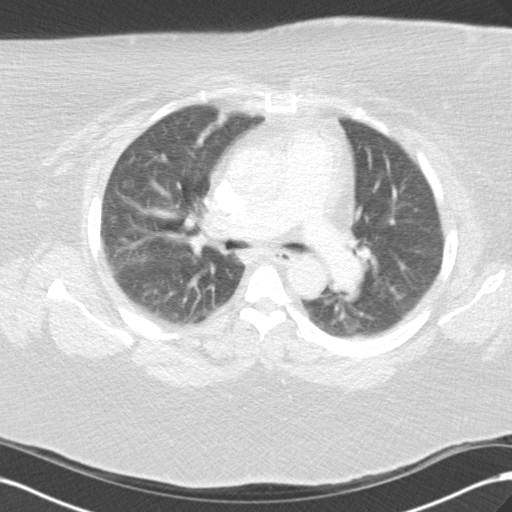
[im 101/143  soft-tissue]
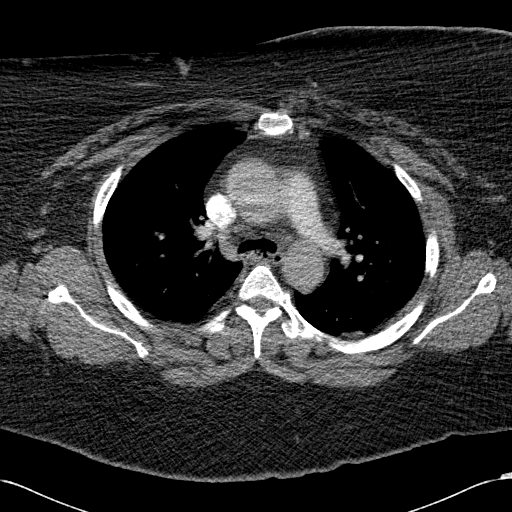
[im 109/143  lung]
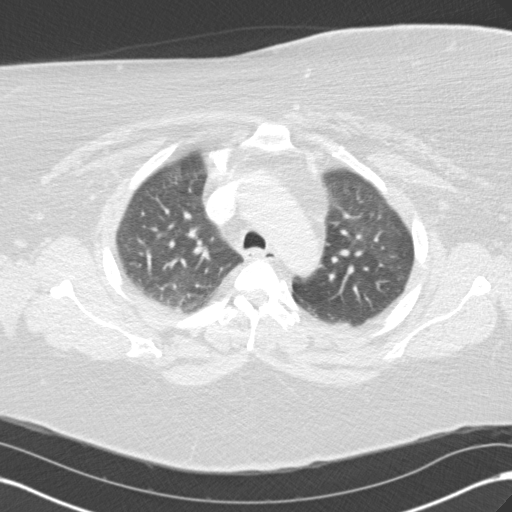
[im 117/143  soft-tissue]
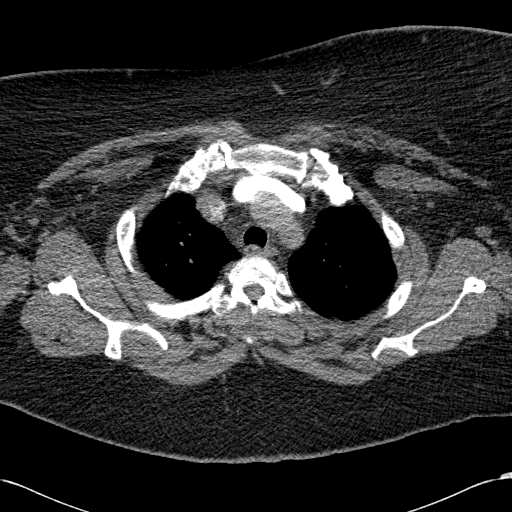
[im 126/143  lung]
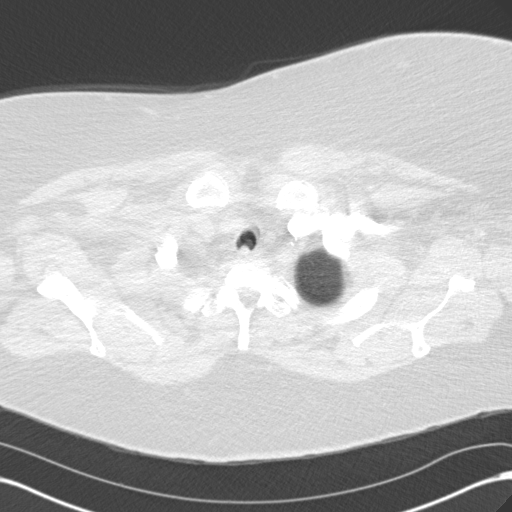
[im 134/143  soft-tissue]
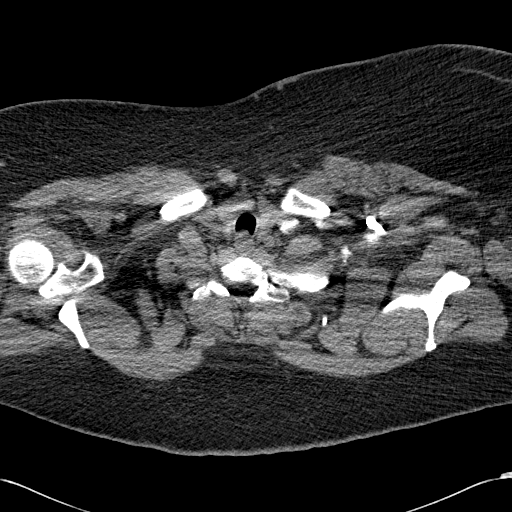

[Series 5: pulm embolism 2.0 b60f lung · axial · 0.70mm/px · z∈[-284,-230]mm · 3 of 118 slices shown]
[im 10/118  soft-tissue]
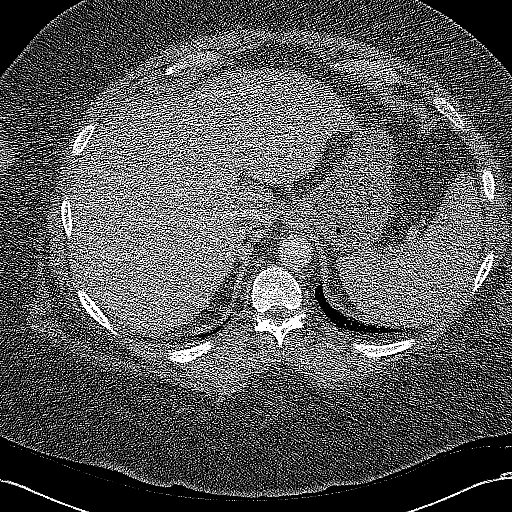
[im 28/118  soft-tissue]
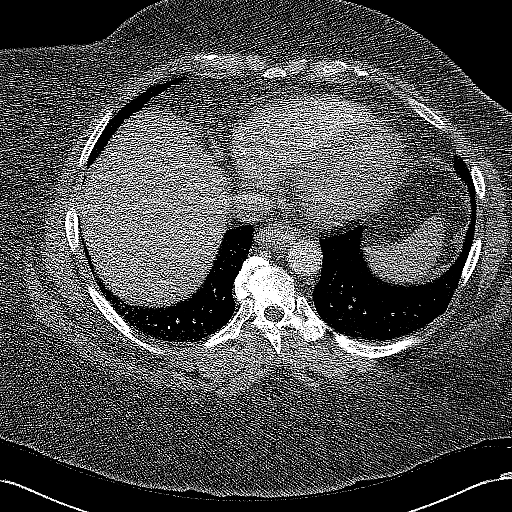
[im 37/118  soft-tissue]
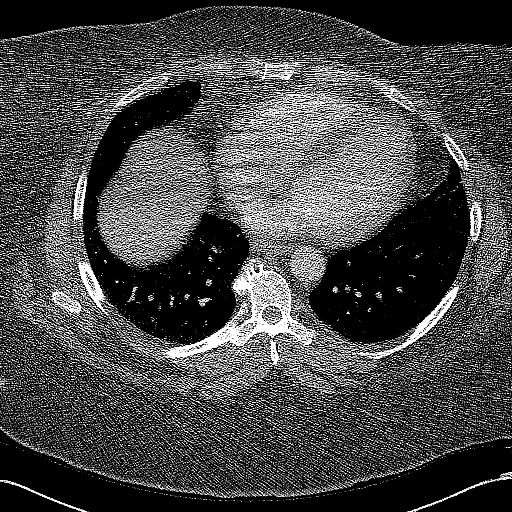

[19 of 32 positions shown; findings below may reference images not displayed]

FINDINGS: Contrast opacification of the pulmonary arteries is suboptimal, and the study is also limited by large patient habitus. Opacification of the peripheral pulmonary arteries is limited, however, there is no evidence of acute pulmonary embolism within the main or central pulmonary arteries. There is no evidence of thoracic aortic aneurysm. No hilar or mediastinal masses are identified. 
There is no evidence of pleural or pericardial effusion. Both lungs are clear and there is no evidence of pulmonary infiltrate or mass.
IMPRESSION: 1.  Limited study due to patient habitus and suboptimal opacification of peripheral pulmonary arteries. No acute pulmonary emboli identified within main or central pulmonary arteries. 
2.  No acute findings.

## 2009-01-02 ENCOUNTER — Encounter (INDEPENDENT_AMBULATORY_CARE_PROVIDER_SITE_OTHER): Payer: Self-pay | Admitting: Internal Medicine

## 2009-01-10 ENCOUNTER — Ambulatory Visit: Payer: Self-pay | Admitting: Internal Medicine

## 2009-02-07 ENCOUNTER — Ambulatory Visit: Payer: Self-pay | Admitting: Infectious Disease

## 2009-02-07 DIAGNOSIS — M1710 Unilateral primary osteoarthritis, unspecified knee: Secondary | ICD-10-CM

## 2009-02-08 ENCOUNTER — Encounter (INDEPENDENT_AMBULATORY_CARE_PROVIDER_SITE_OTHER): Payer: Self-pay | Admitting: Internal Medicine

## 2009-02-08 LAB — CONVERTED CEMR LAB
Amphetamine Screen, Ur: NEGATIVE
Barbiturate Quant, Ur: NEGATIVE
Cocaine Metabolites: NEGATIVE

## 2009-02-09 IMAGING — CR DG KNEE COMPLETE 4+V*R*
4 series · 4 of 4 positions shown · non-contrast
Comparison: 06/14/06.

CLINICAL DATA: Pain in the knee.  Patient is obese. 
 RIGHT KNEE ? 4 VIEW:

[t knee ap right]
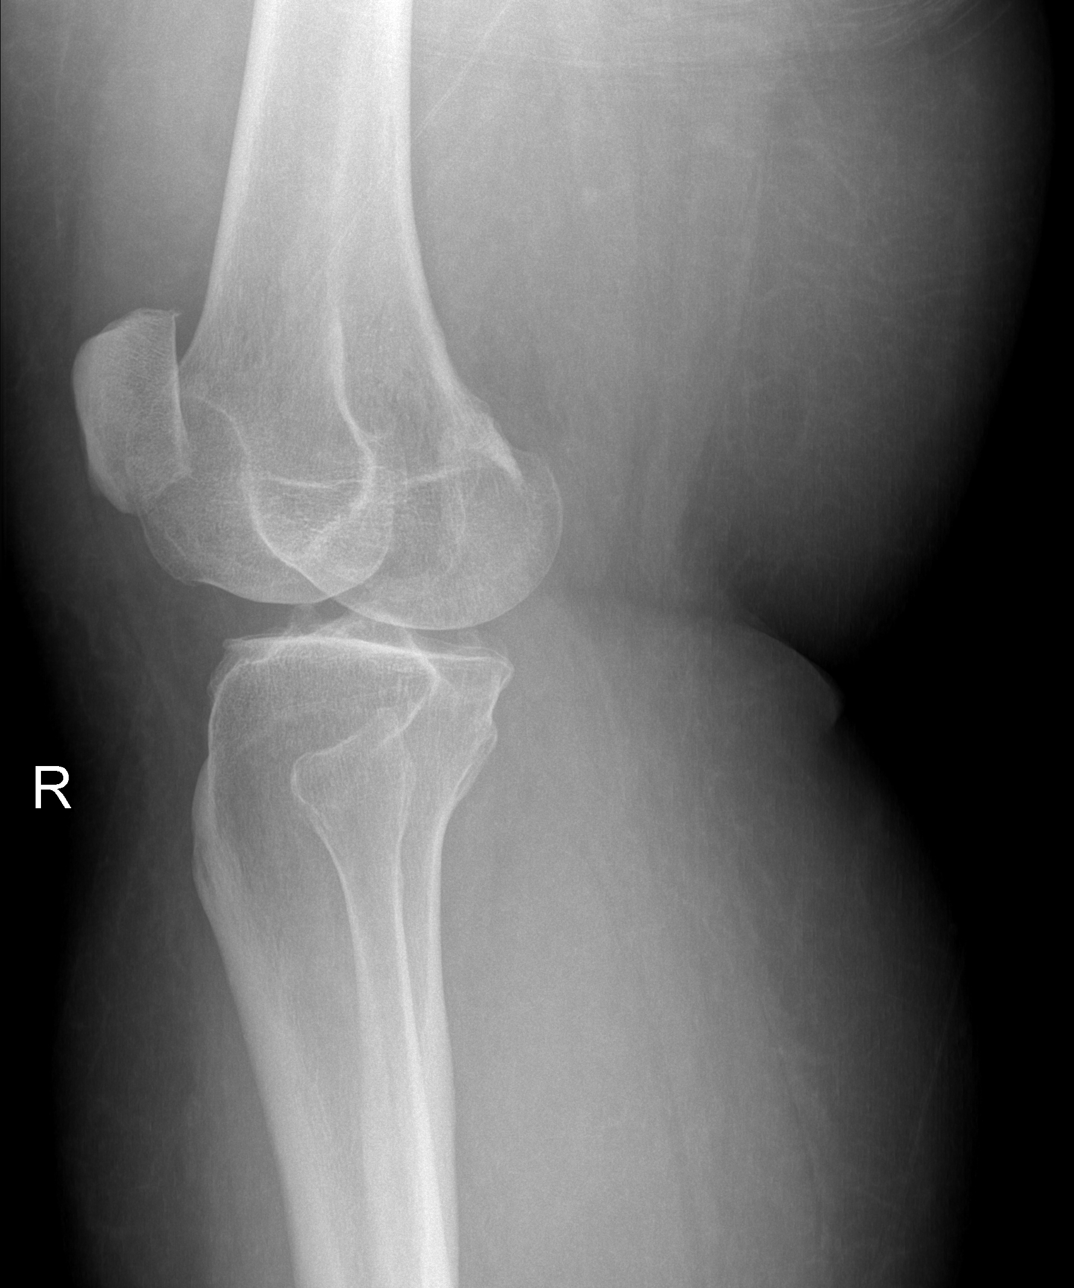

[t knee oblique right (1 of 2)]
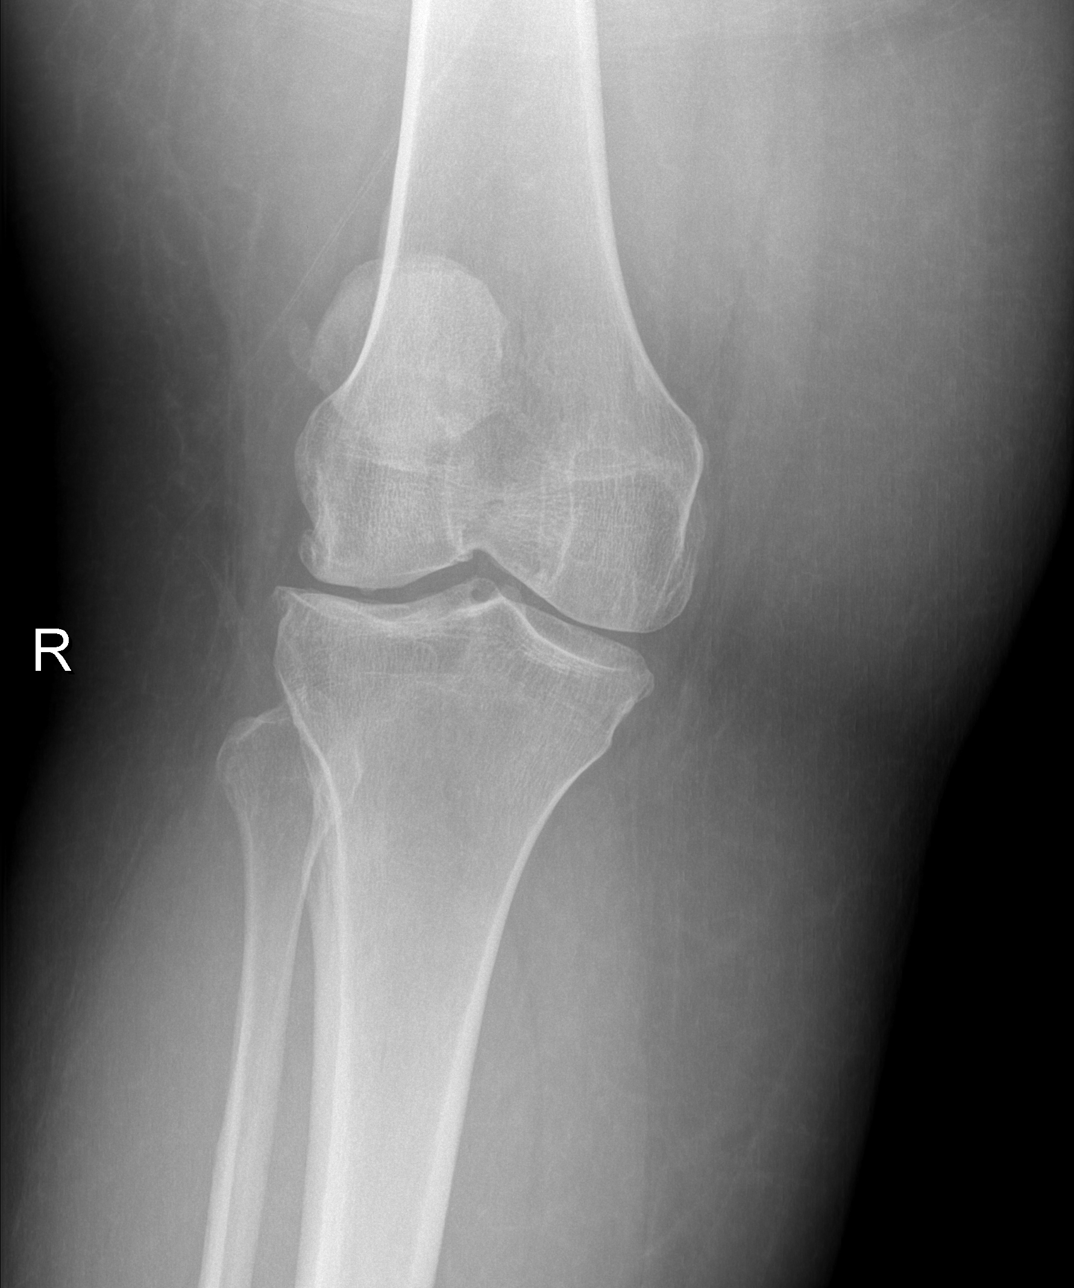

[t knee oblique right (2 of 2)]
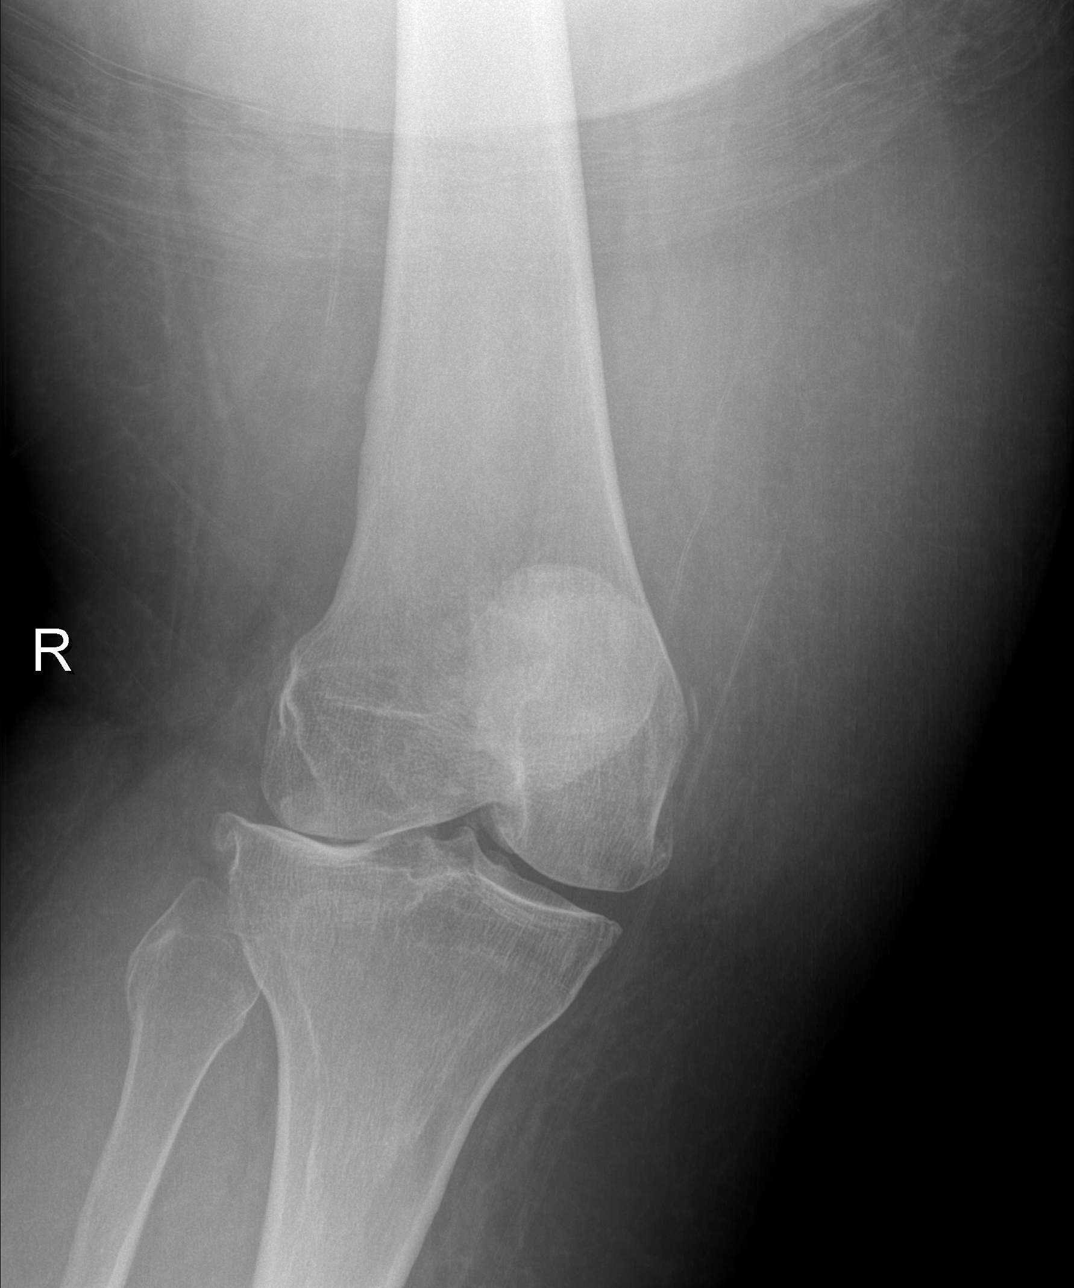

[t knee lat right]
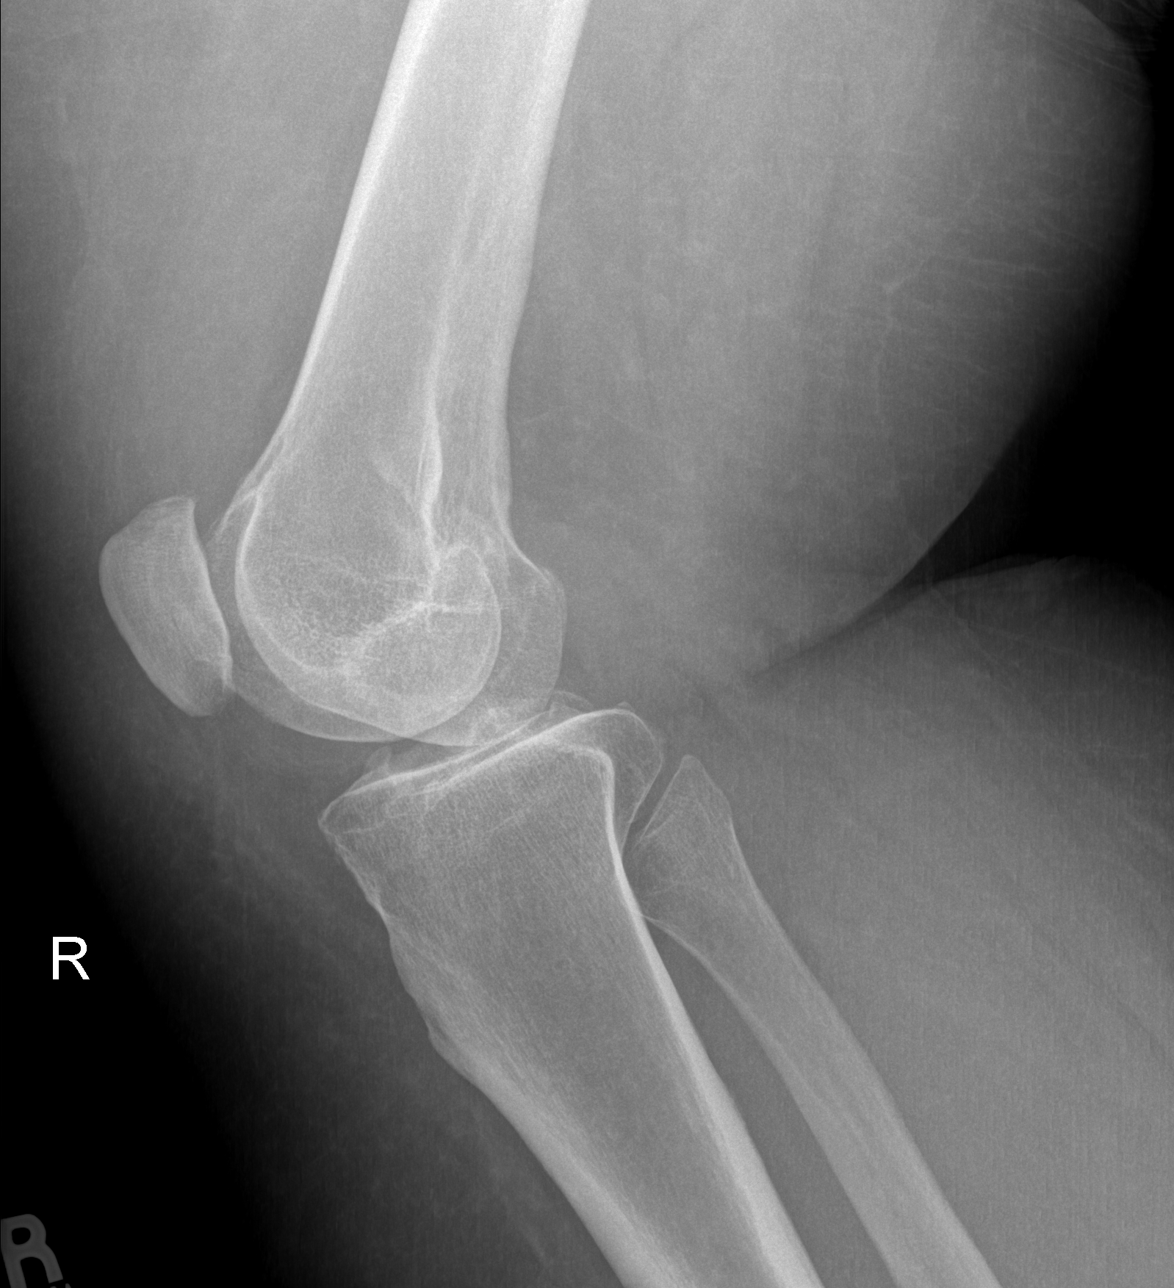

[4 of 4 positions shown; findings below may reference images not displayed]

FINDINGS: There is degenerative change primarily laterally with loss of joint space, sclerosis and spur formation.  No acute fracture is seen.  There is a small knee joint effusion present.
IMPRESSION: Degenerative changes laterally.  Small knee joint effusion.

## 2009-02-13 ENCOUNTER — Encounter (INDEPENDENT_AMBULATORY_CARE_PROVIDER_SITE_OTHER): Payer: Self-pay | Admitting: Internal Medicine

## 2009-02-23 ENCOUNTER — Encounter (INDEPENDENT_AMBULATORY_CARE_PROVIDER_SITE_OTHER): Payer: Self-pay | Admitting: Internal Medicine

## 2009-02-26 IMAGING — CR DG CHEST 2V
2 series · 2 of 2 positions shown · non-contrast
Comparison: 06/14/06.

CLINICAL DATA: 41 year-old-female with shortness of breath.  
 CHEST - 2 VIEW ? 08/10/06:

[w chest pa]
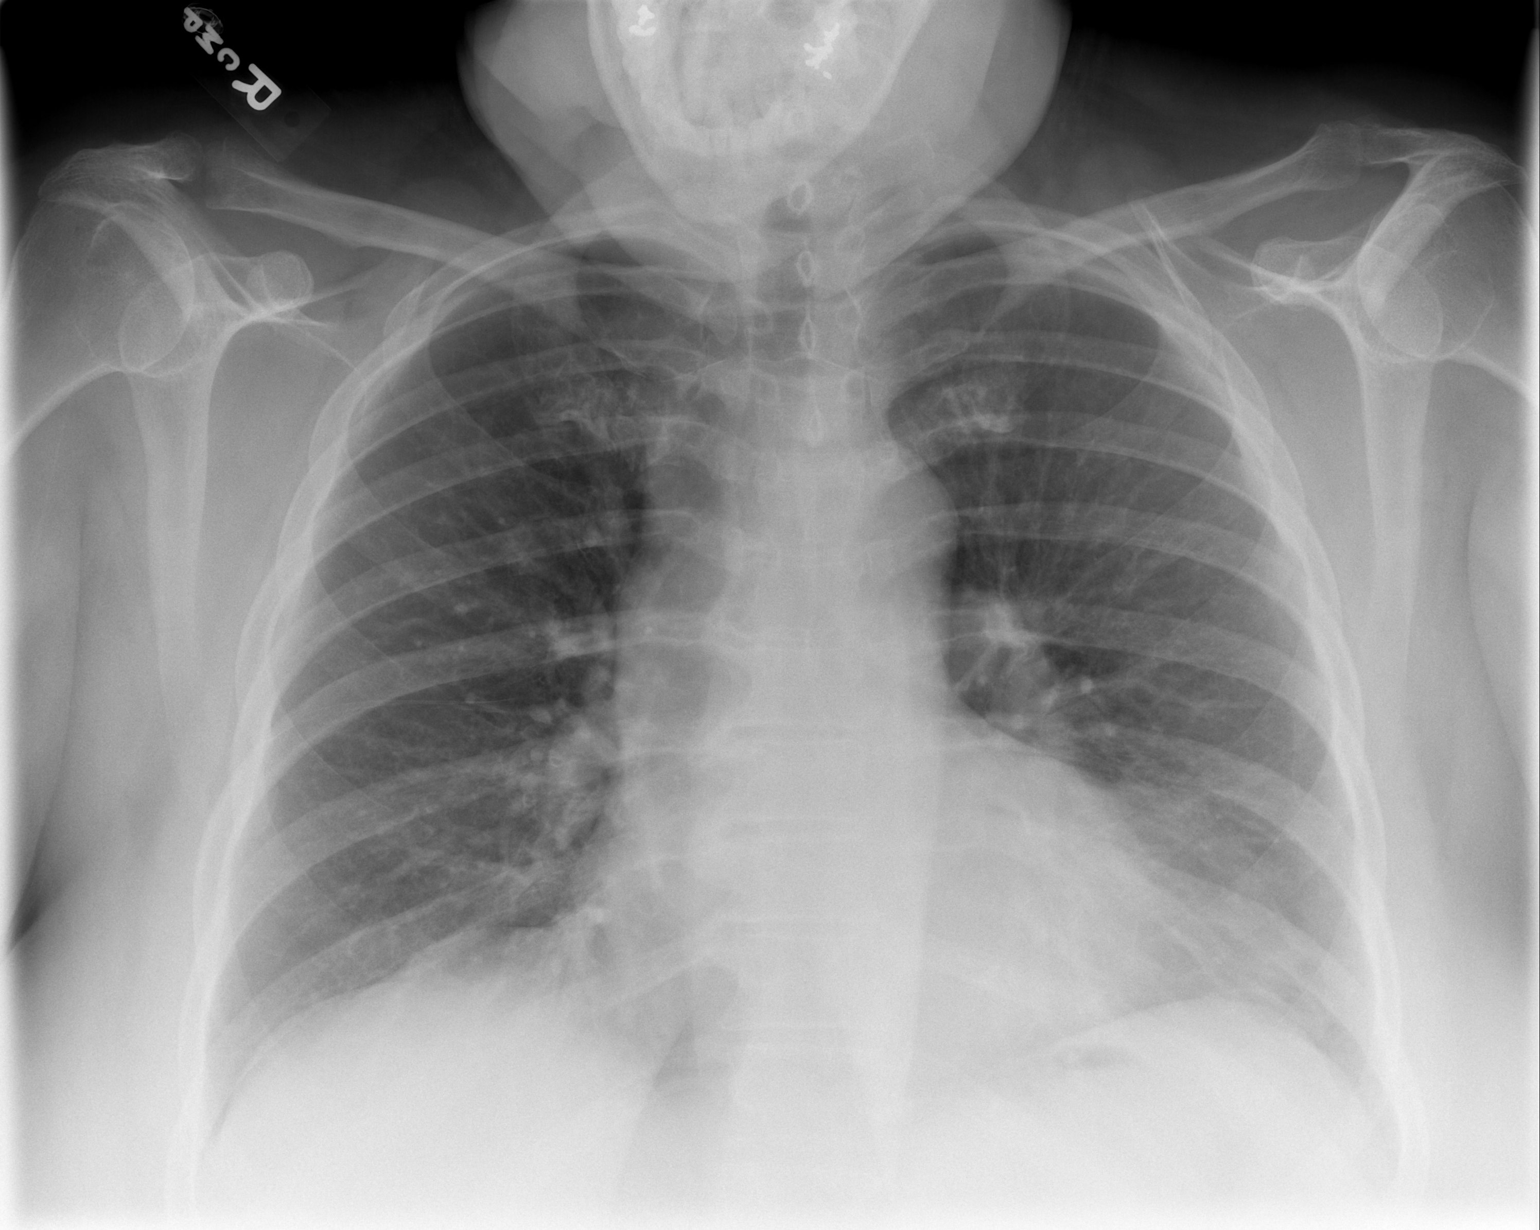

[w chest lat]
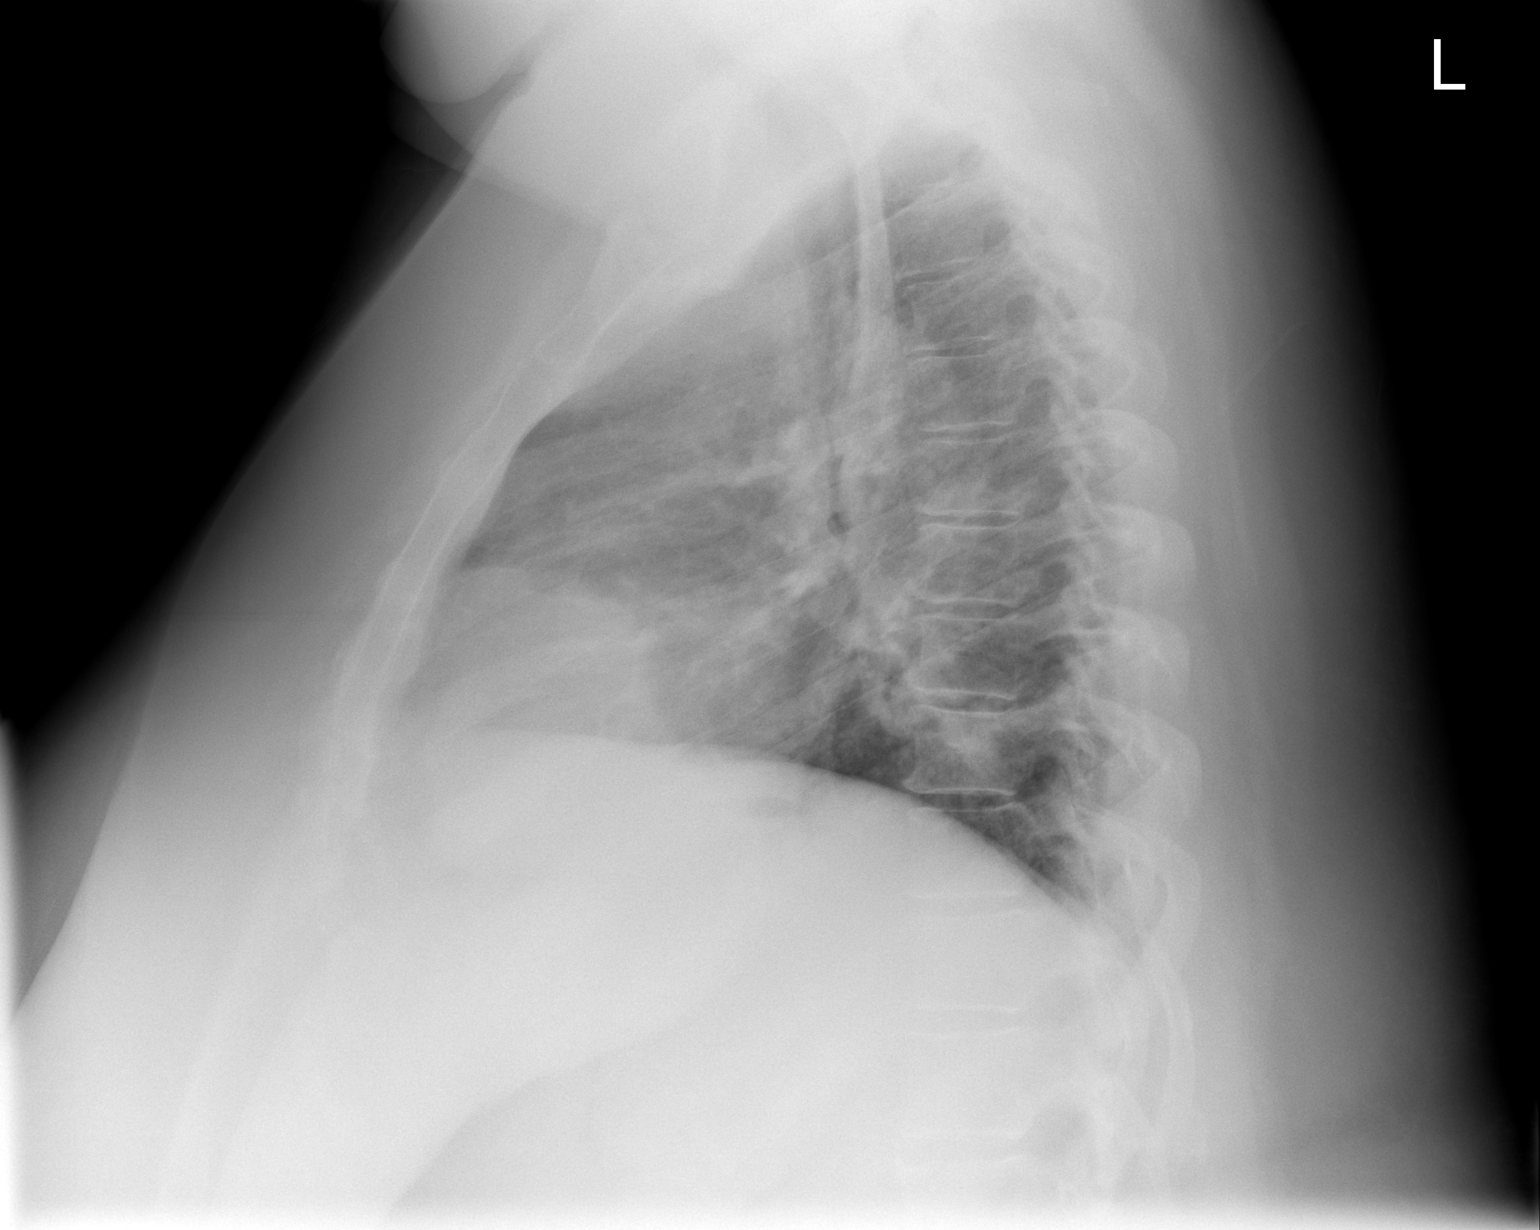

[2 of 2 positions shown; findings below may reference images not displayed]

FINDINGS: The heart size is upper limits or normal.  The soft tissues and bony thorax are unremarkable.
IMPRESSION: 1.  Stable cardiomegaly without failure. 
 2.  No acute cardiopulmonary disease.

## 2009-03-02 IMAGING — CR DG CHEST 2V
2 series · 2 of 2 positions shown · non-contrast
Comparison: 08/10/2006.

Exam: Chest, 2 views.

HISTORY: Asthma and shortness of breath

[w chest pa]
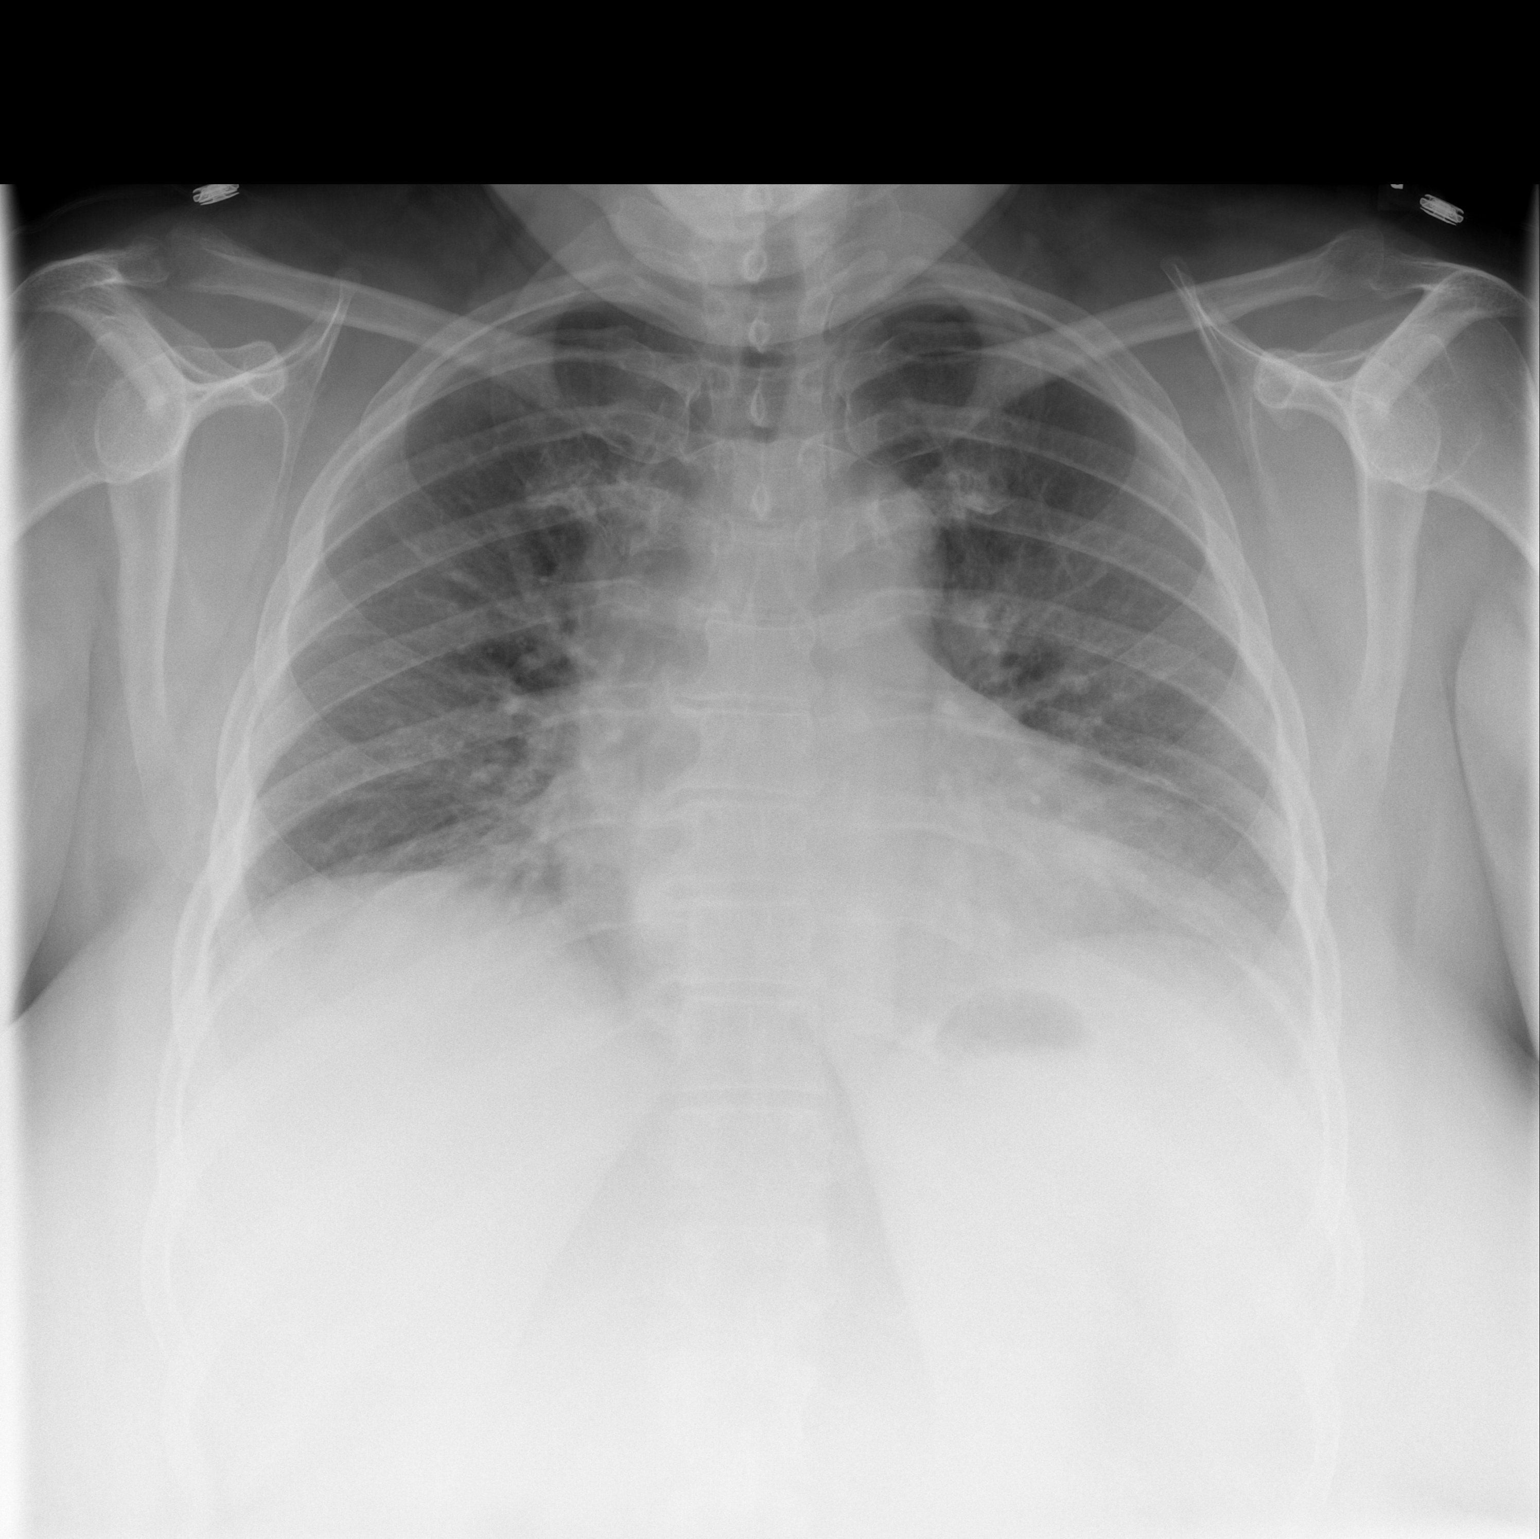

[w chest lat]
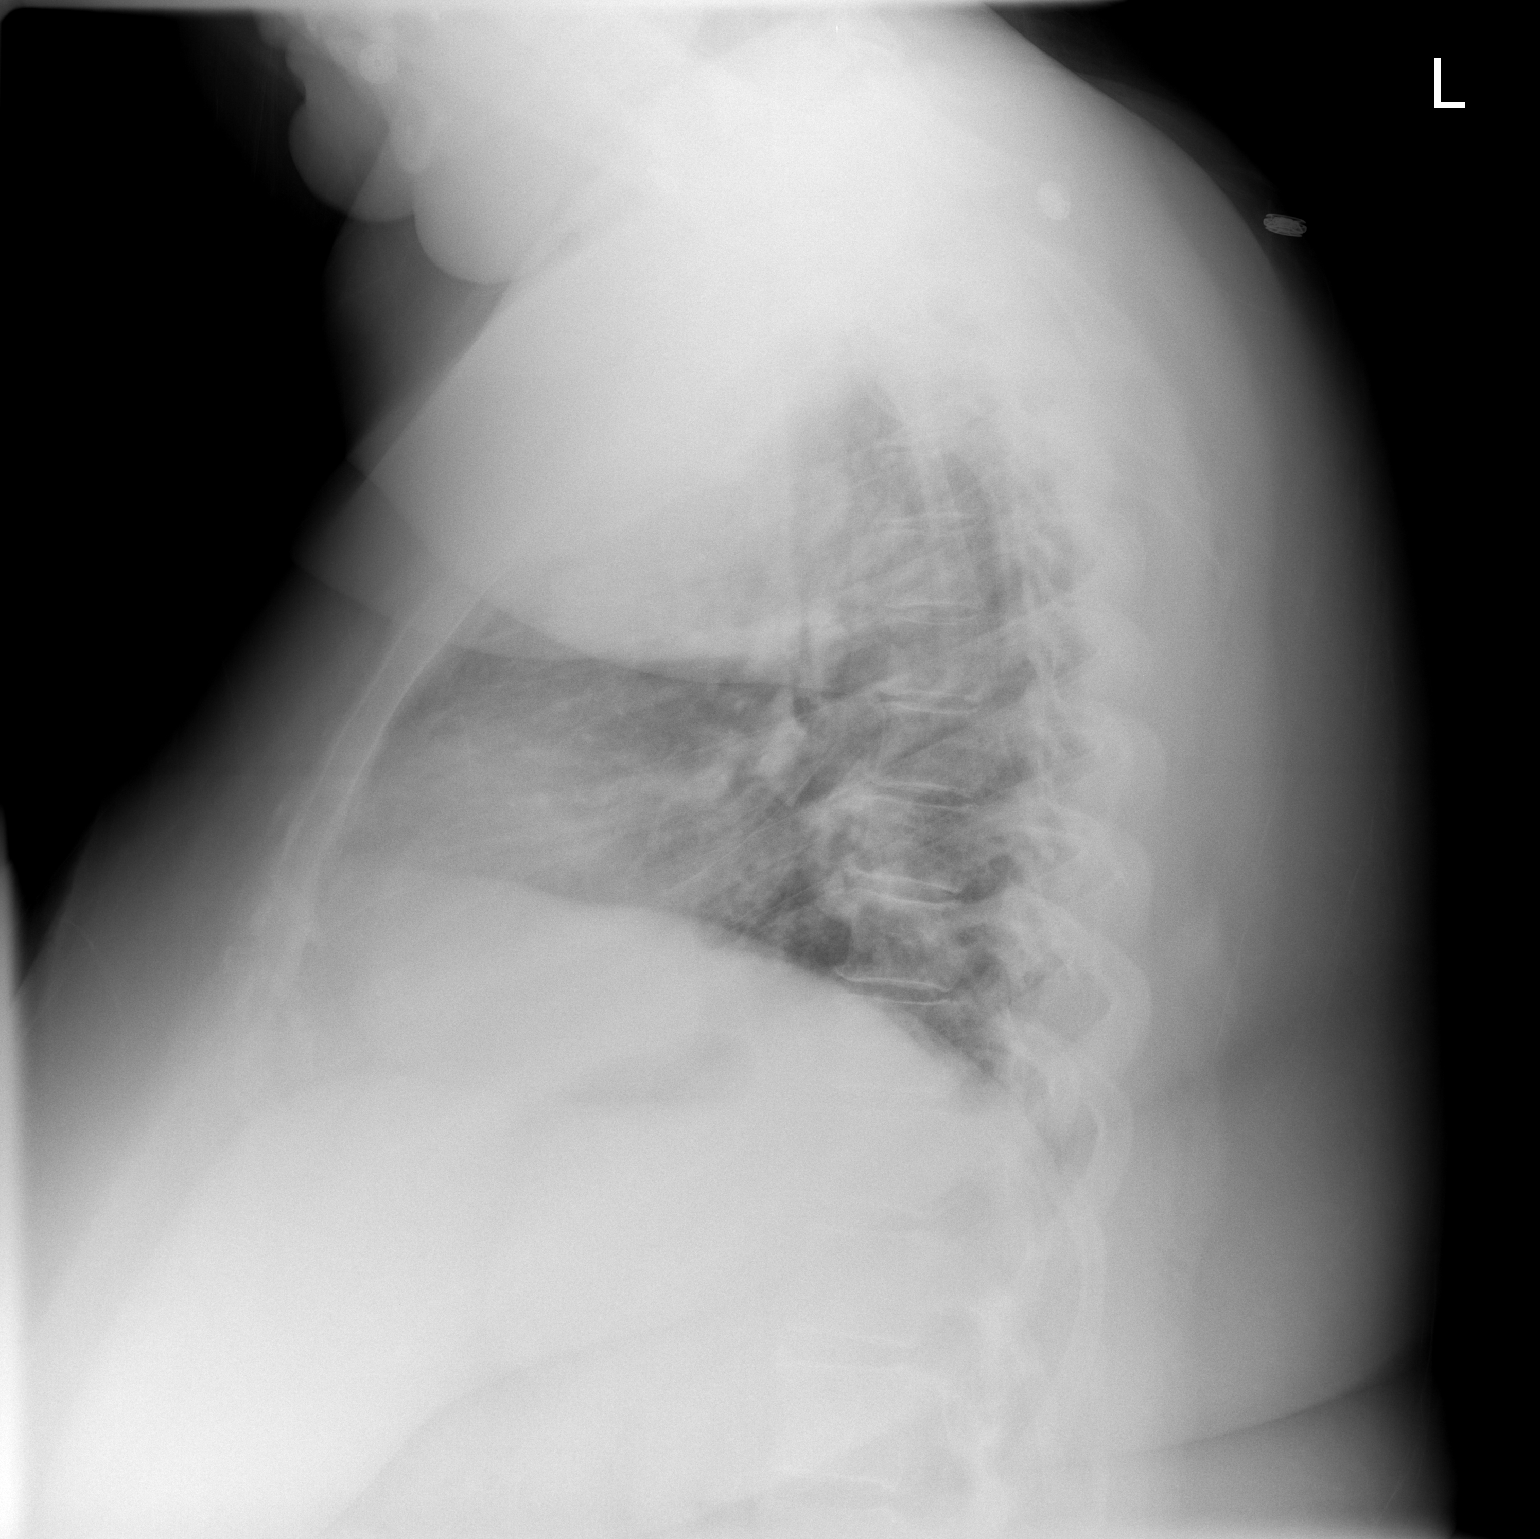

[2 of 2 positions shown; findings below may reference images not displayed]

FINDINGS: Heart size is enlarged.

There is no effusion or edema.

Low lung volumes with bibasilar atelectasis. New from prior exam.
IMPRESSION: 1. Cardiac enlargement without heart failure.
2. Low lung volumes with bibasilar atelectasis new from prior exam

## 2009-03-10 IMAGING — CR DG CHEST 2V
1 series · 1 of 1 positions shown · non-contrast
Comparison: 08/14/2006

CLINICAL DATA: Asthma. shortness of breath.

CHEST - 2 VIEW

[view not recorded]
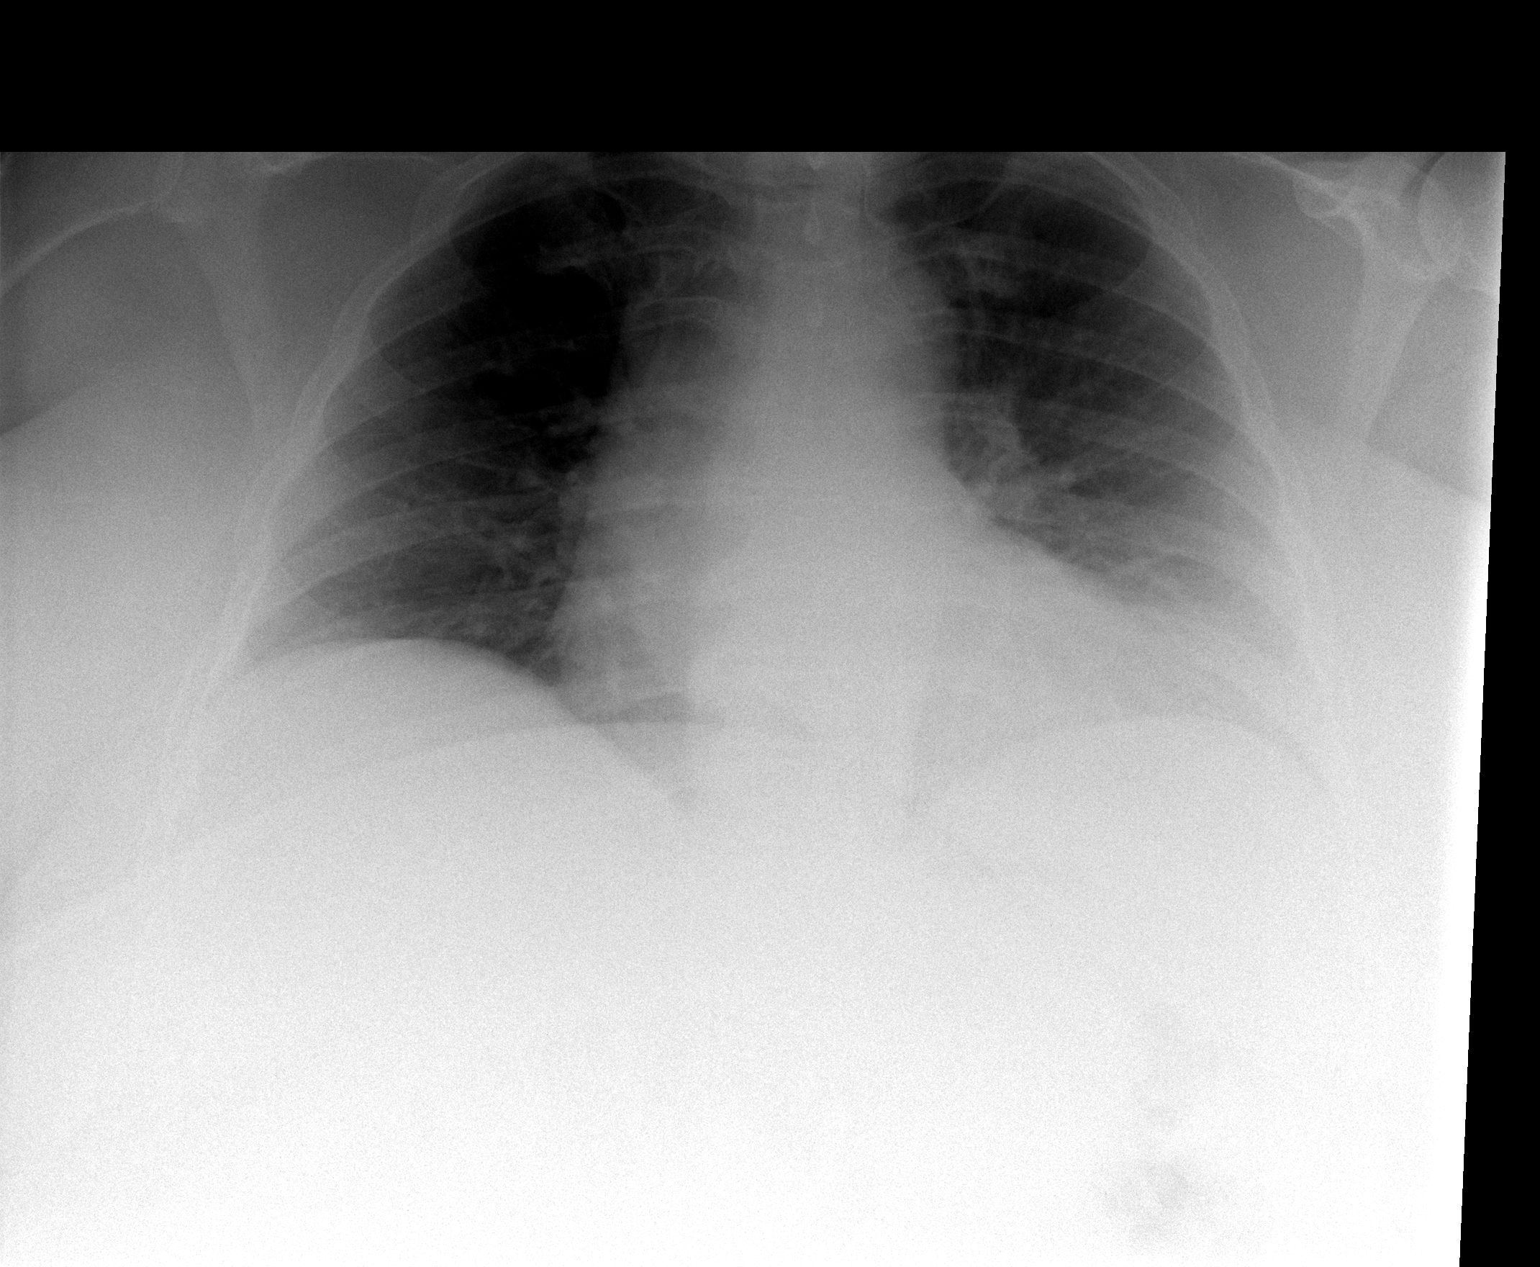

[1 of 1 positions shown; findings below may reference images not displayed]

FINDINGS: Mild motion limited lateral view. Mild cardiomegaly. Mediastinal
contours otherwise normal. Sharp costophrenic angles. Lung volumes remain low.
No pneumothorax or congestive failure. Increased density in retrocardiac region
on frontal view felt to be due to overlying soft tissues.

IMPRESSION

1. Mild cardiomegaly and low lung volumes. No acute disease.

## 2009-04-01 IMAGING — CR DG CHEST 2V
1 series · 1 of 1 positions shown · non-contrast
Comparison: Two view chest x-ray 08/22/2006.

CLINICAL DATA: Cough, shortness of breath.

CHEST - 2 VIEW  09/13/2006:

[w chest lat]
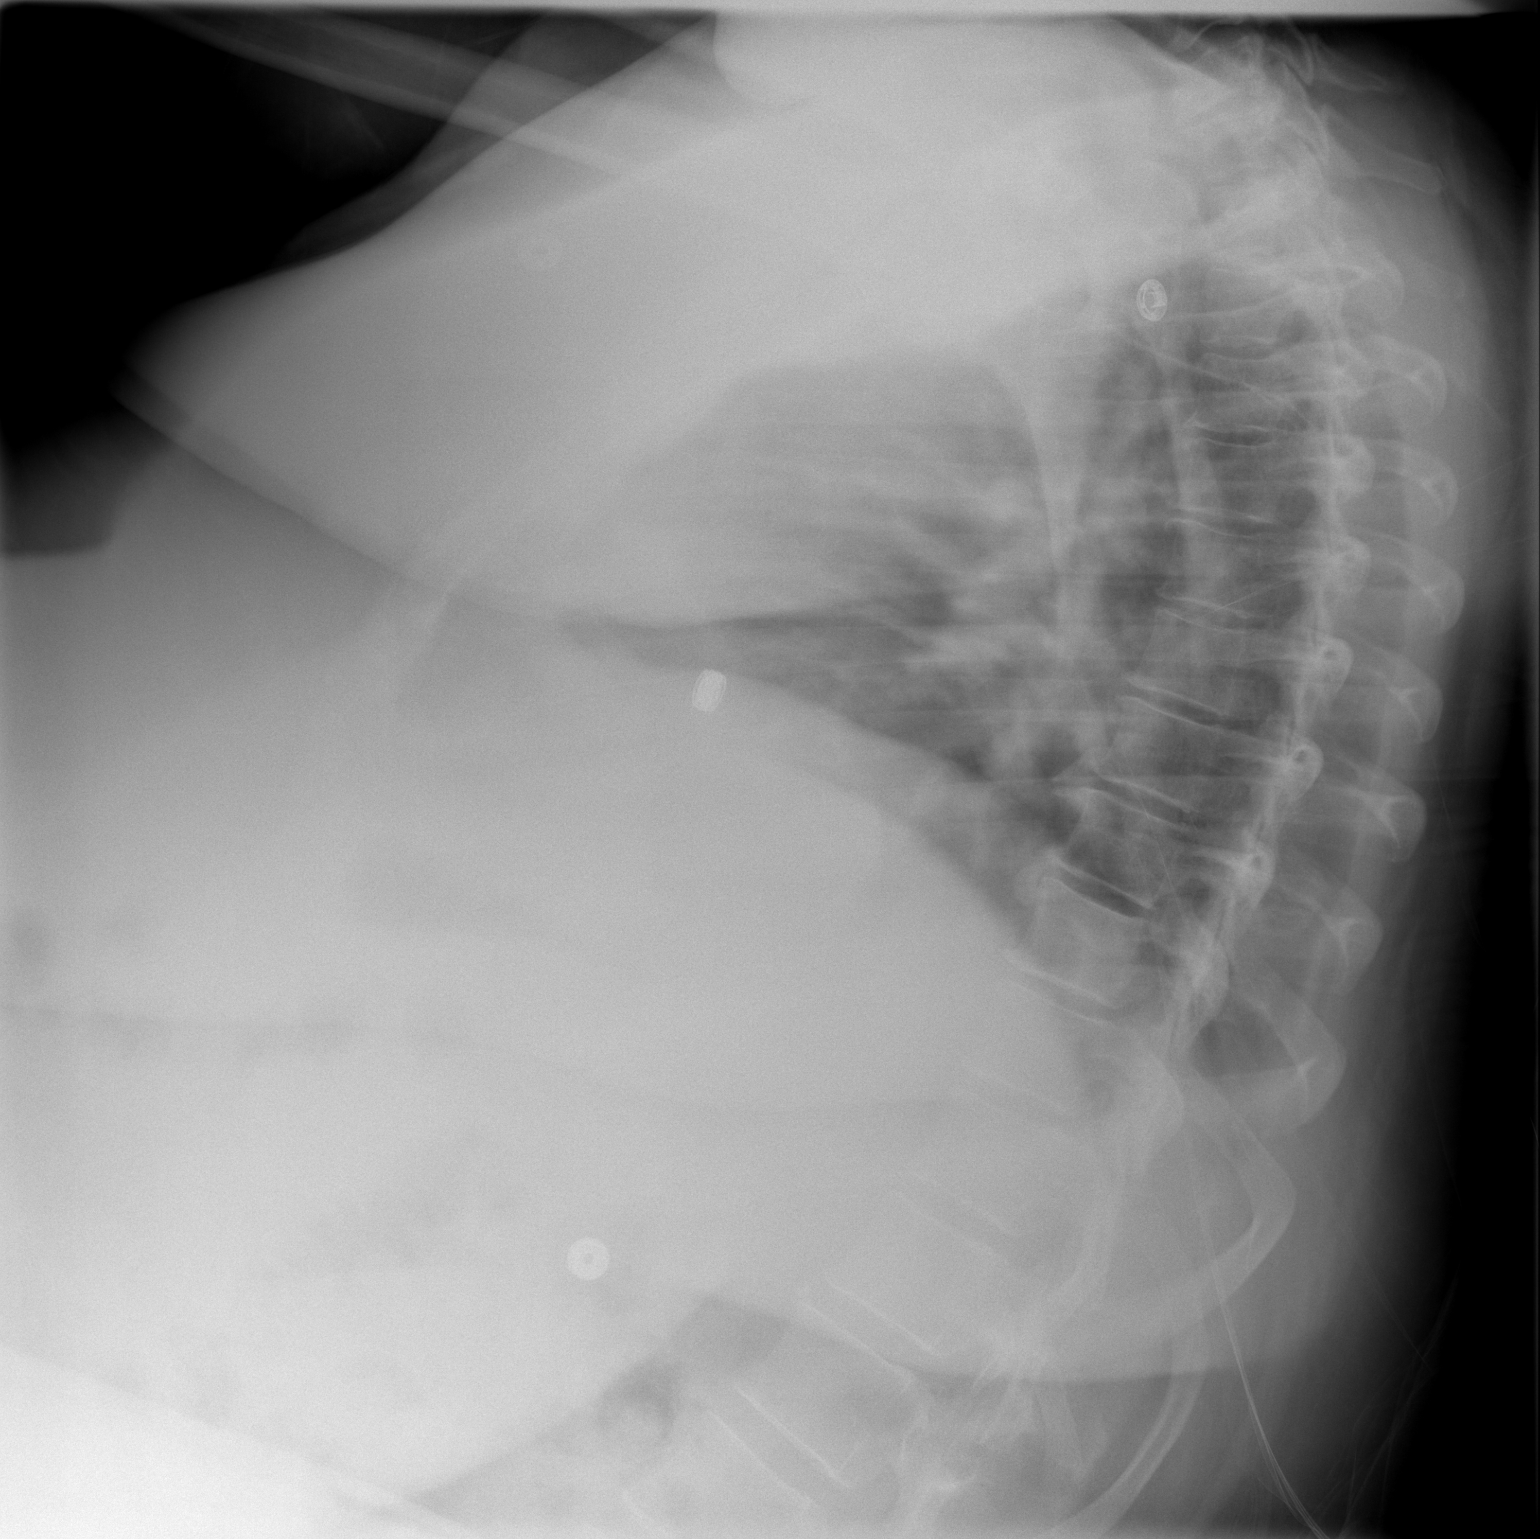

[1 of 1 positions shown; findings below may reference images not displayed]

FINDINGS: Markedly suboptimal inspiration due to patient's body habitus
accounting for atelectasis in the lower lobes. Lungs otherwise clear. Heart
mildly enlarged but stable. Degenerative changes in the lower thoracic spine
again noted. No pleural effusions.
IMPRESSION: Markedly suboptimal inspiration accounting for atelectasis in the lower lobes.

## 2009-04-01 IMAGING — CT CT ANGIO CHEST
3 of 5 series · 18 of 30 positions shown · IV contrast (omnipaque)
Comparison: 09/13/06 and 06/15/06.

CLINICAL DATA: 41-year-old morbidly obese female with shortness of breath.  Left chest pain for 2 days.  Hypertension.  Asthma. 
CT ANGIOGRAPHY OF CHEST:
TECHNIQUE: Multidetector CT imaging of the chest was performed during bolus injection of intravenous contrast.  Multiplanar CT angiographic image reconstructions were generated to evaluate the vascular anatomy.
Contrast:  120 cc Omnipaque 350.

[Series 3: pe · axial · 0.78mm/px · z∈[-326,-63]mm · 9 of 271 slices shown]
[im 31/271  lung]
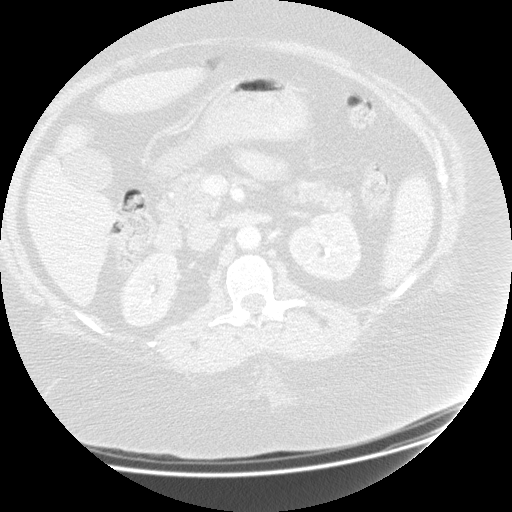
[im 61/271  mediastinal]
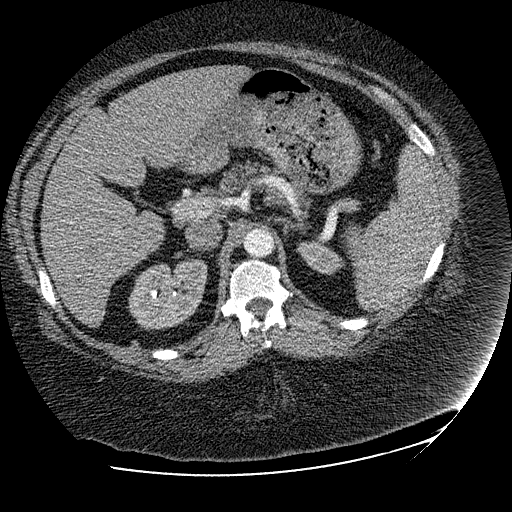
[im 91/271  lung]
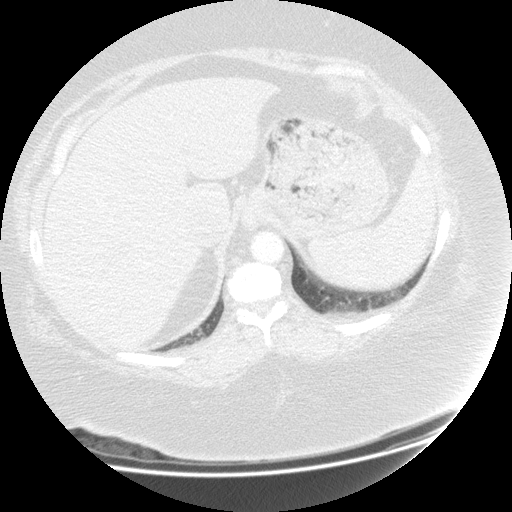
[im 121/271  mediastinal]
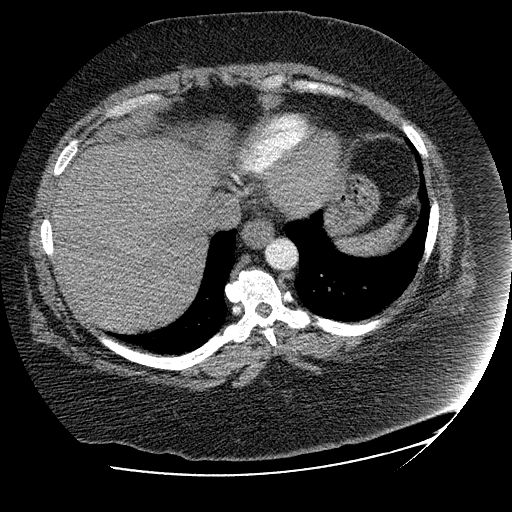
[im 141/271  lung]
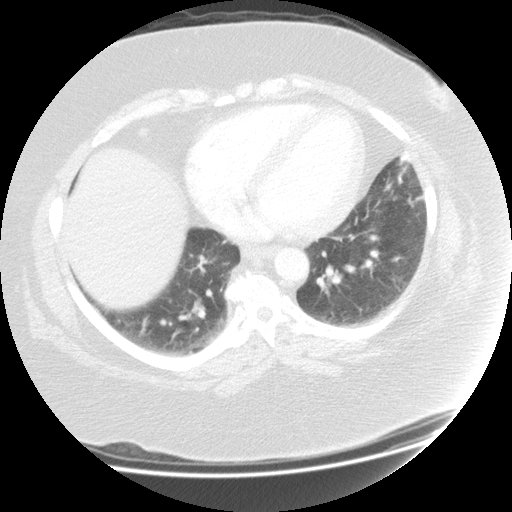
[im 151/271  mediastinal]
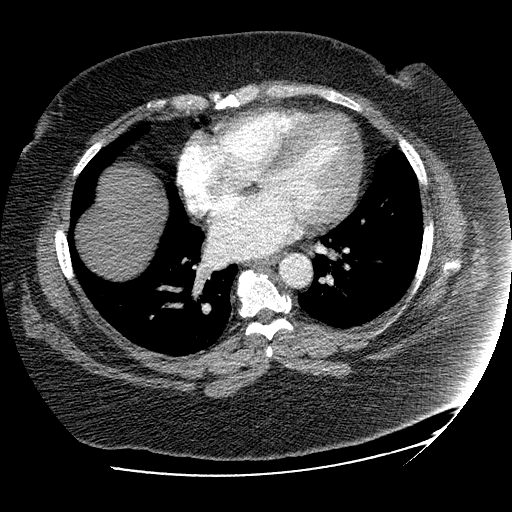
[im 181/271  lung]
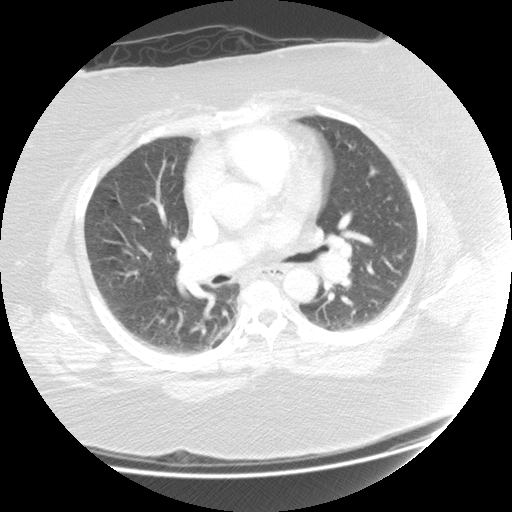
[im 211/271  mediastinal]
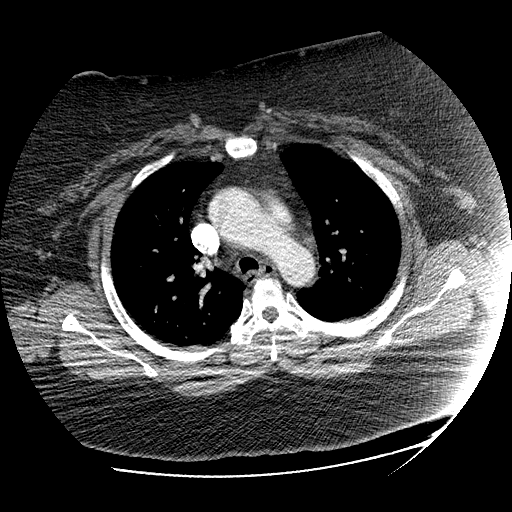
[im 241/271  lung]
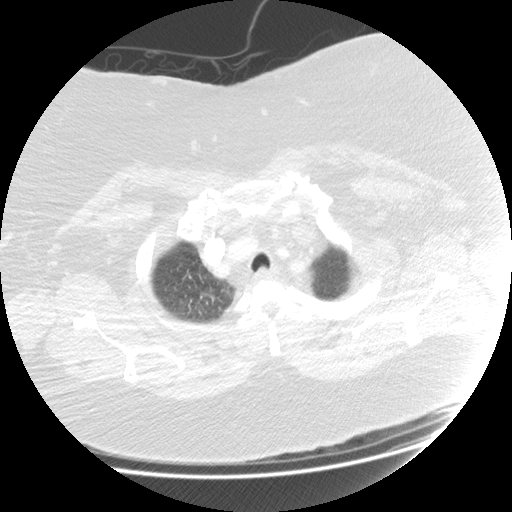

[Series 4: recon 2: pe · axial · 0.78mm/px · z∈[-281,-111]mm · 4 of 136 slices shown]
[im 34/136  lung]
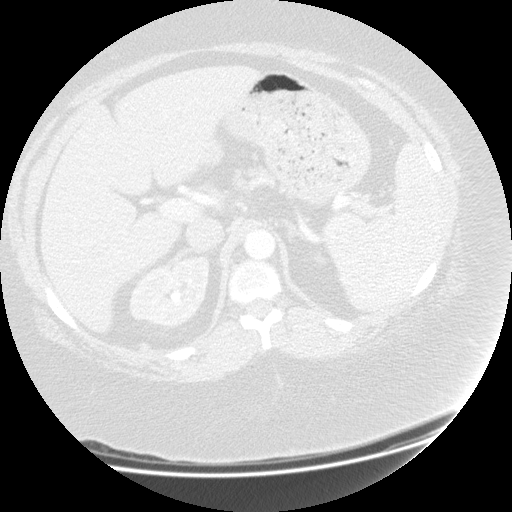
[im 68/136  lung]
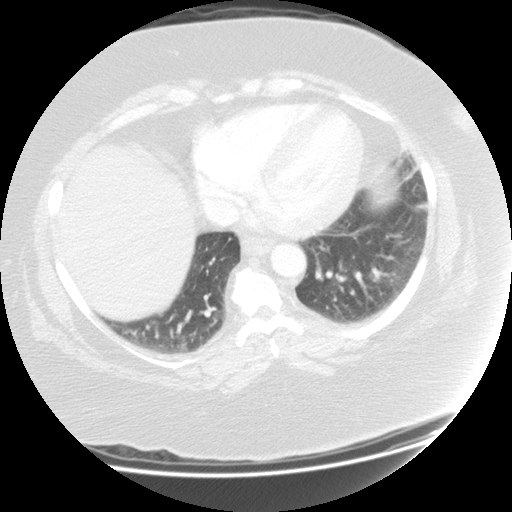
[im 71/136  lung]
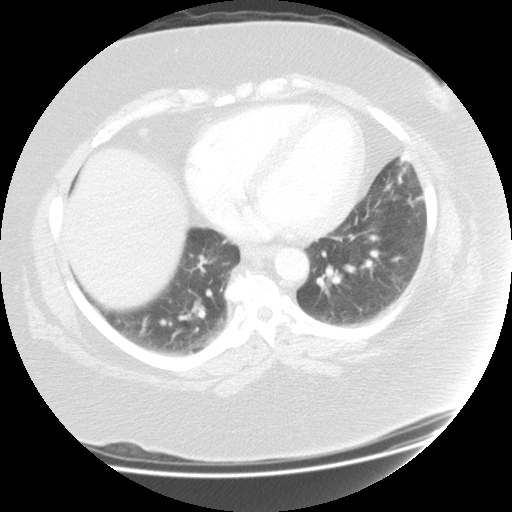
[im 102/136  lung]
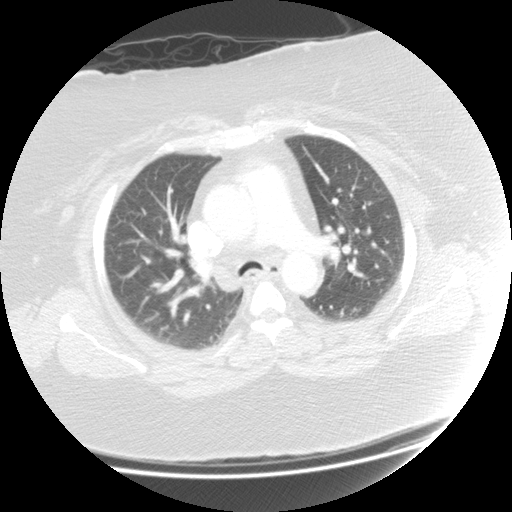

[Series 300: sag angio chest · sagittal · 0.78mm/px · 5 of 167 slices shown]
[im 28/167  lung]
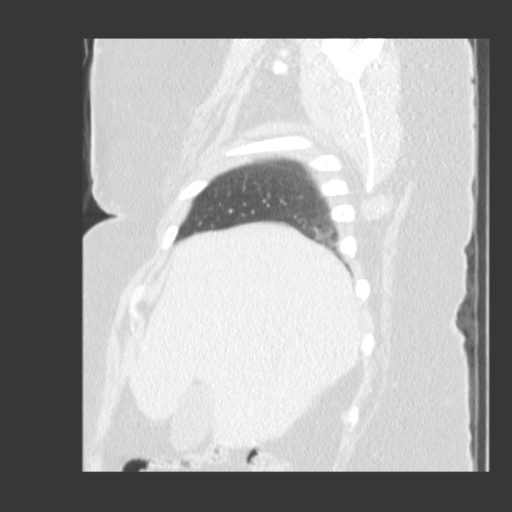
[im 56/167  lung]
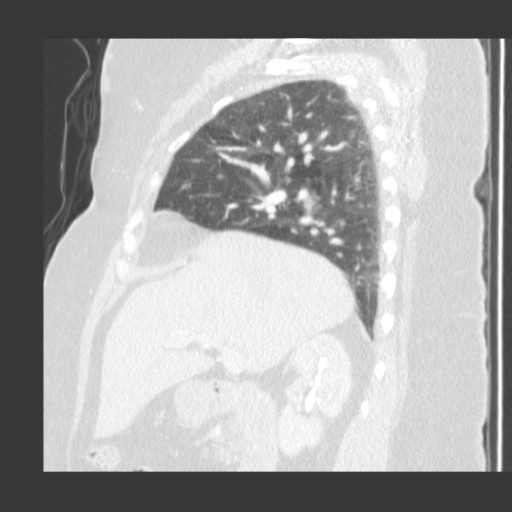
[im 84/167  lung]
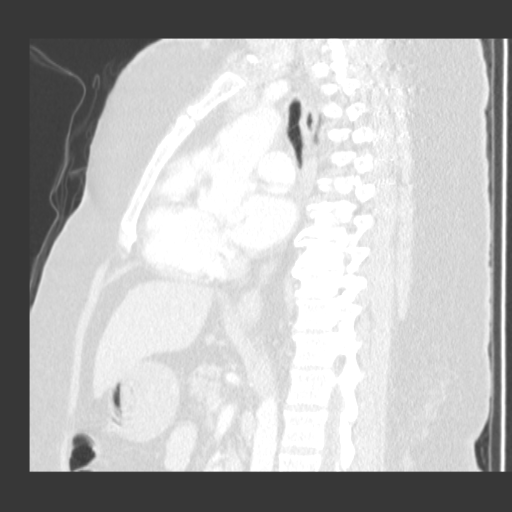
[im 111/167  lung]
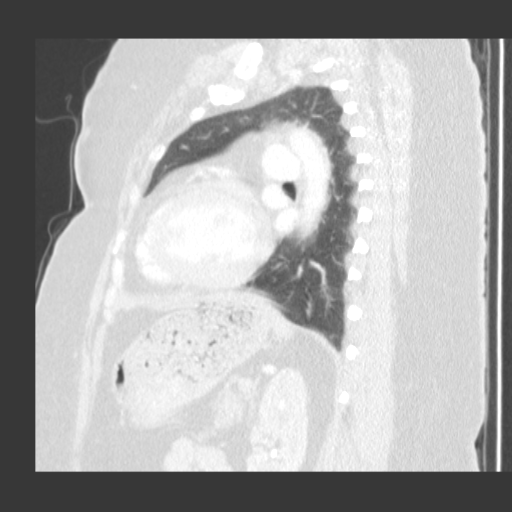
[im 139/167  lung]
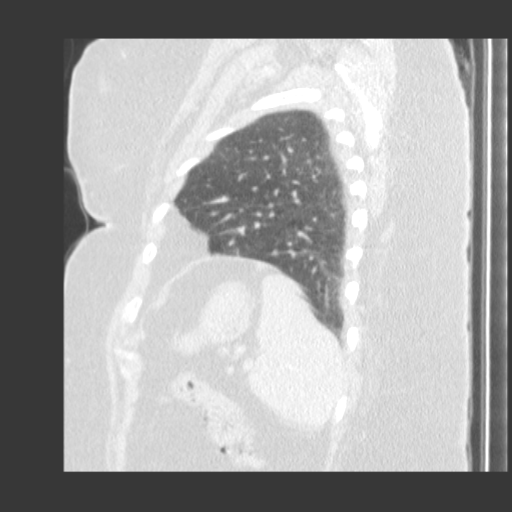

[18 of 30 positions shown; findings below may reference images not displayed]

FINDINGS: Exam is limited due to the patient's body habitus.  Central enhanced pulmonary arteries are well visualized.  No central, saddle, or proximal hilar embolus.  Limited evaluation of the more peripheral smaller pulmonary arteries to exclude small PE to the chest.  Thoracic aorta appears intact.  Small axillary lymph nodes are present.  No definite adenopathy within the chest or hilum.  No pericardial or pleural effusion.  Degenerative changes of the thoracic spine. 
Included imaging of the upper abdomen demonstrates no additional acute finding. 
Lung windows reveal bibasilar atelectasis.  No acute consolidation, pneumonia, interstitial process, or airway abnormality. 
Minimumal nonspecific subpleural nodularity in the right middle lobe (image 51).
IMPRESSION: 1.  Limited exam, but no proximal or central significant PE to the chest.
2.  Bibasilar atelectasis with hypoventilatory changes. 
3.  No acute interstitial process, pneumonia, or edema.
4.  Stable exam.

## 2009-04-19 DIAGNOSIS — Q828 Other specified congenital malformations of skin: Secondary | ICD-10-CM

## 2009-04-27 ENCOUNTER — Encounter (INDEPENDENT_AMBULATORY_CARE_PROVIDER_SITE_OTHER): Payer: Self-pay | Admitting: Internal Medicine

## 2009-04-27 ENCOUNTER — Encounter: Payer: Self-pay | Admitting: Internal Medicine

## 2009-04-27 ENCOUNTER — Ambulatory Visit: Payer: Self-pay | Admitting: Internal Medicine

## 2009-04-27 ENCOUNTER — Inpatient Hospital Stay (HOSPITAL_COMMUNITY): Admission: AD | Admit: 2009-04-27 | Discharge: 2009-04-30 | Payer: Self-pay | Admitting: Internal Medicine

## 2009-04-27 DIAGNOSIS — L0291 Cutaneous abscess, unspecified: Secondary | ICD-10-CM

## 2009-04-27 DIAGNOSIS — L0390 Cellulitis, unspecified: Secondary | ICD-10-CM

## 2009-04-30 ENCOUNTER — Encounter (INDEPENDENT_AMBULATORY_CARE_PROVIDER_SITE_OTHER): Payer: Self-pay | Admitting: Internal Medicine

## 2009-05-02 ENCOUNTER — Telehealth (INDEPENDENT_AMBULATORY_CARE_PROVIDER_SITE_OTHER): Payer: Self-pay | Admitting: Internal Medicine

## 2009-05-09 ENCOUNTER — Encounter (INDEPENDENT_AMBULATORY_CARE_PROVIDER_SITE_OTHER): Payer: Self-pay | Admitting: Internal Medicine

## 2009-05-11 IMAGING — CR DG KNEE COMPLETE 4+V*R*
4 series · 4 of 4 positions shown · non-contrast
Comparison: 07/24/06.

CLINICAL DATA: ER patient complaining of knee pain after falling several months ago.
 RIGHT KNEE ? 4 VIEW:

[t knee ap right *]
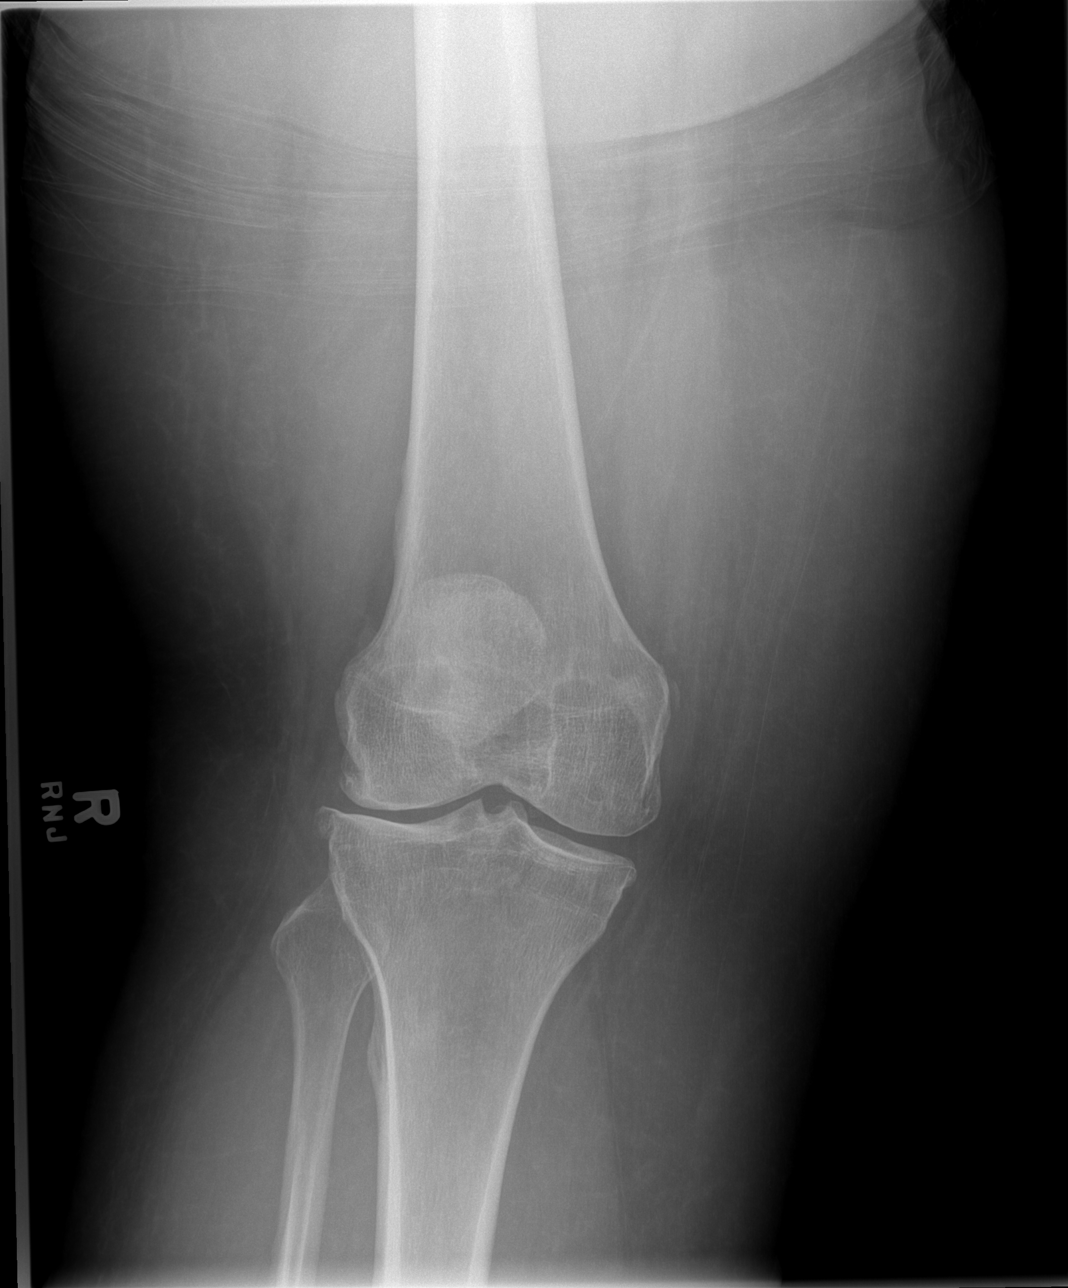

[t knee oblique right *]
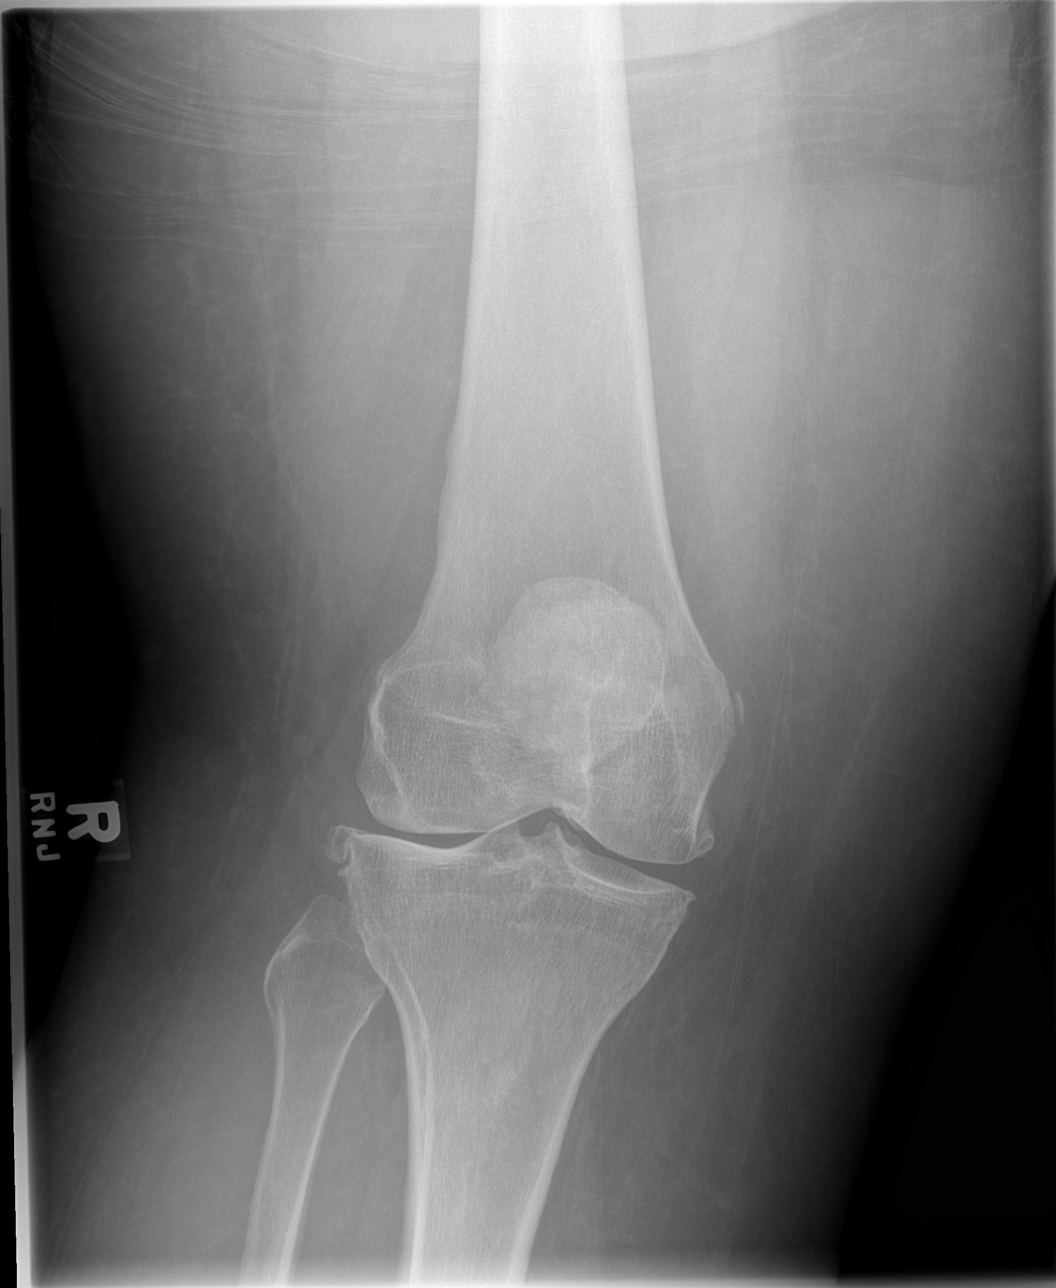

[t knee oblique right]
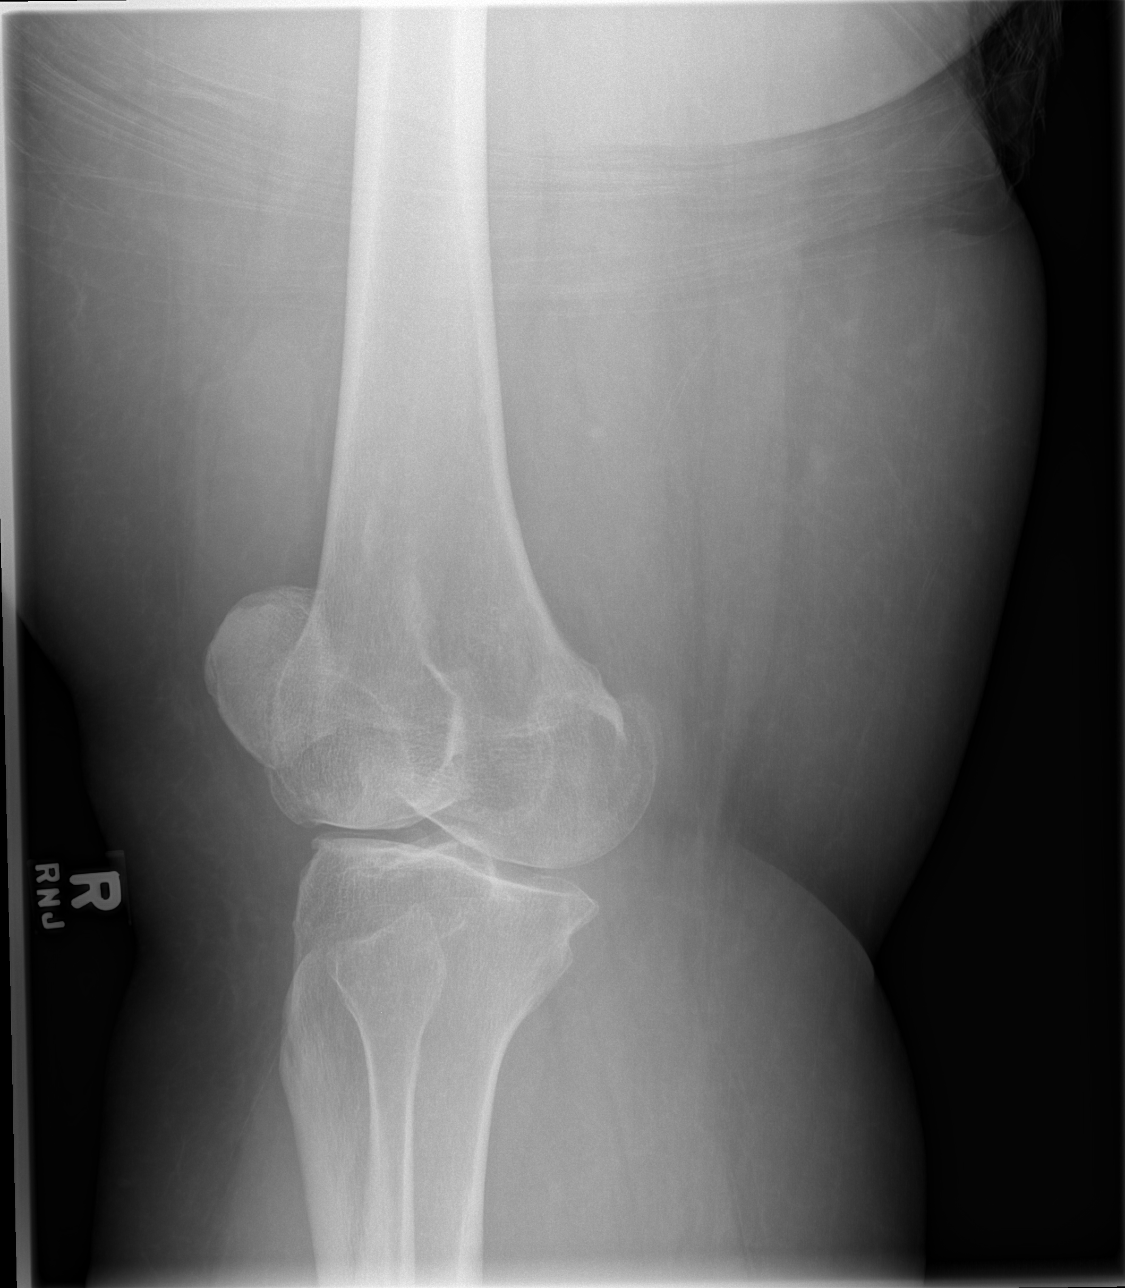

[t knee lat right *]
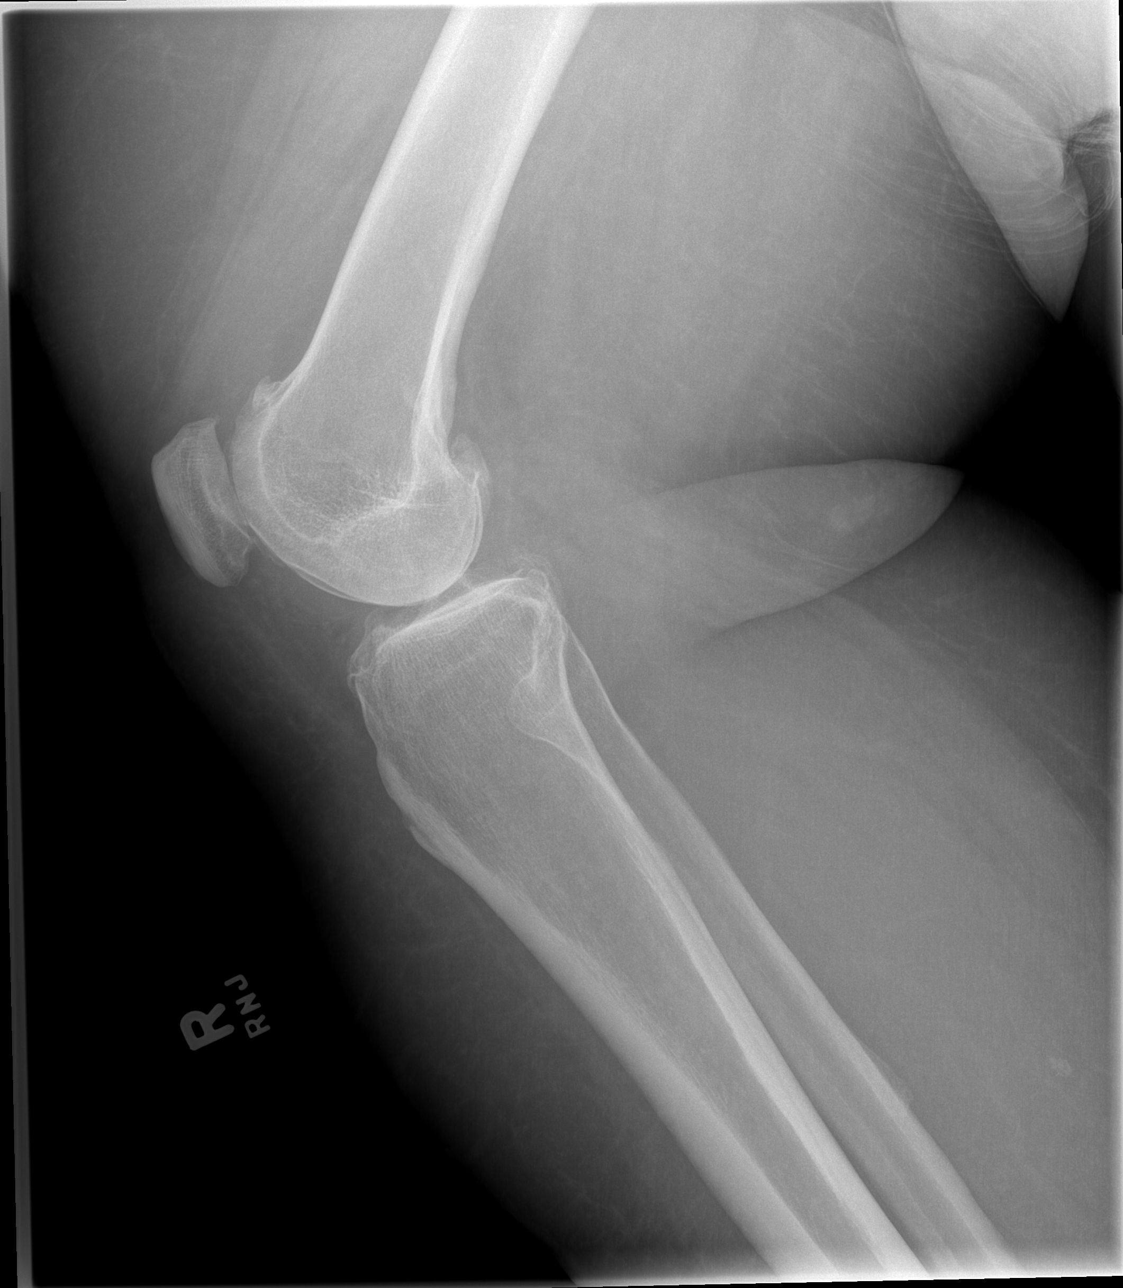

[4 of 4 positions shown; findings below may reference images not displayed]

FINDINGS: There is no evidence of acute fracture or dislocation. No knee joint effusion is seen. Tricompartmental degenerative changes are again noted most advanced laterally.  There is an ossific density adjacent to the medial femoral condyle which may be related to an old MCL injury.
IMPRESSION: Stable examination with chronic findings as described. No acute abnormality.

## 2009-05-16 ENCOUNTER — Ambulatory Visit: Payer: Self-pay | Admitting: Internal Medicine

## 2009-05-16 DIAGNOSIS — K219 Gastro-esophageal reflux disease without esophagitis: Secondary | ICD-10-CM

## 2009-05-16 LAB — CONVERTED CEMR LAB
CO2: 29 meq/L (ref 19–32)
Calcium: 8.7 mg/dL (ref 8.4–10.5)
Glucose, Bld: 83 mg/dL (ref 70–99)
Potassium: 4.1 meq/L (ref 3.5–5.3)
Sodium: 142 meq/L (ref 135–145)

## 2009-05-23 ENCOUNTER — Encounter (INDEPENDENT_AMBULATORY_CARE_PROVIDER_SITE_OTHER): Payer: Self-pay | Admitting: Internal Medicine

## 2009-06-25 ENCOUNTER — Emergency Department (HOSPITAL_COMMUNITY): Admission: EM | Admit: 2009-06-25 | Discharge: 2009-06-26 | Payer: Self-pay | Admitting: Emergency Medicine

## 2009-07-27 ENCOUNTER — Telehealth: Payer: Self-pay | Admitting: *Deleted

## 2009-07-30 ENCOUNTER — Emergency Department (HOSPITAL_COMMUNITY): Admission: EM | Admit: 2009-07-30 | Discharge: 2009-07-30 | Payer: Self-pay | Admitting: Emergency Medicine

## 2009-08-02 IMAGING — CR DG KNEE COMPLETE 4+V*R*
4 series · 4 of 4 positions shown · non-contrast
Comparison: 10/23/06.

CLINICAL DATA: Fell a year ago with pain and swelling since then.  
 RIGHT KNEE ? 4 VIEW:

[t knee ap right]
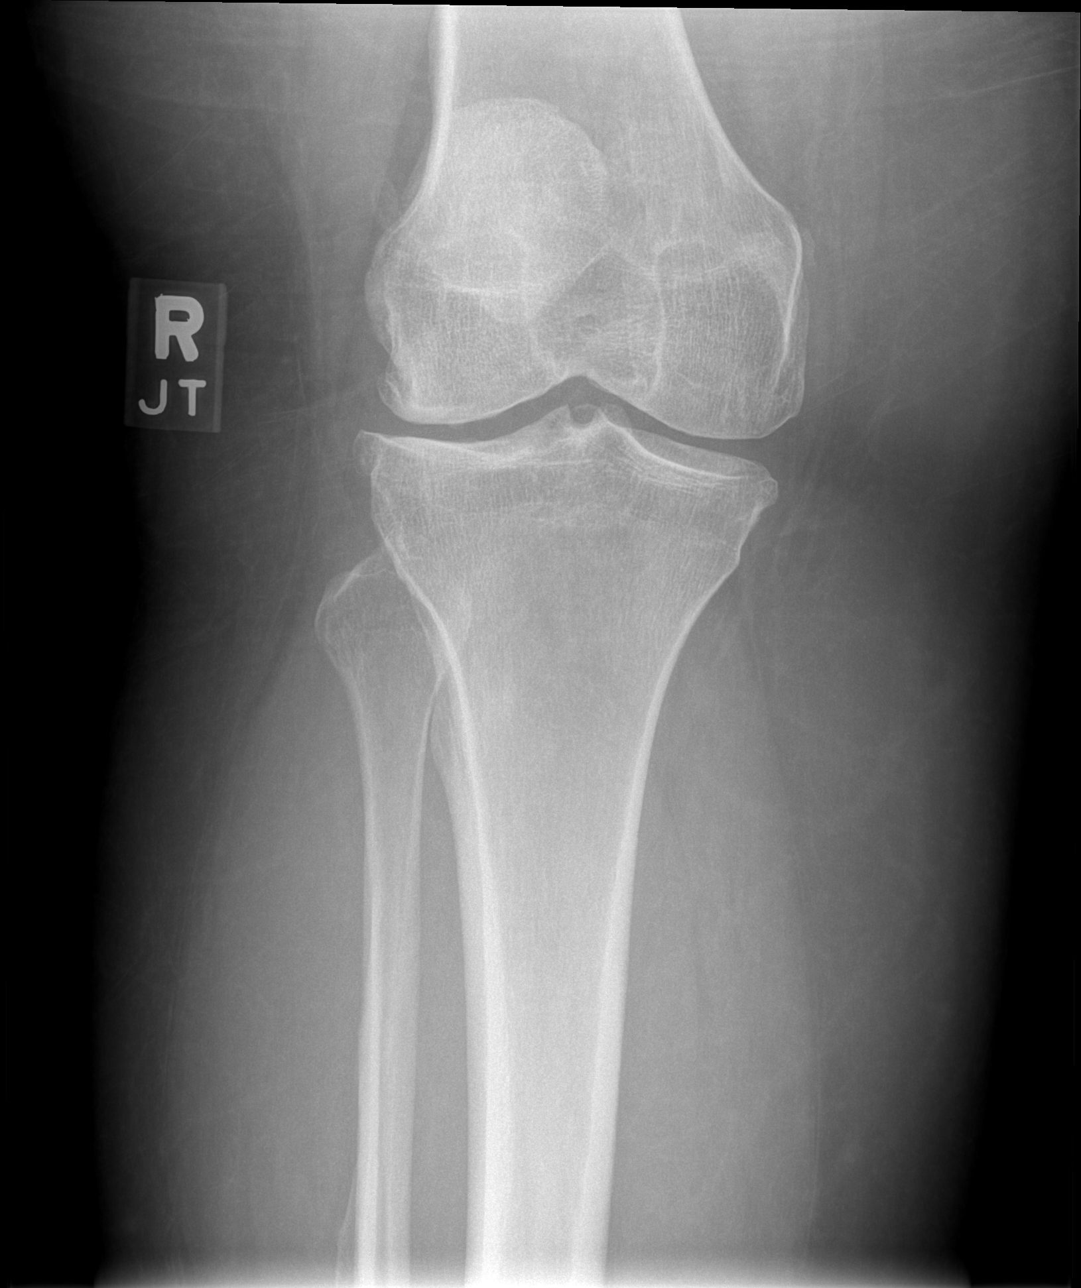

[t knee oblique right (1 of 2)]
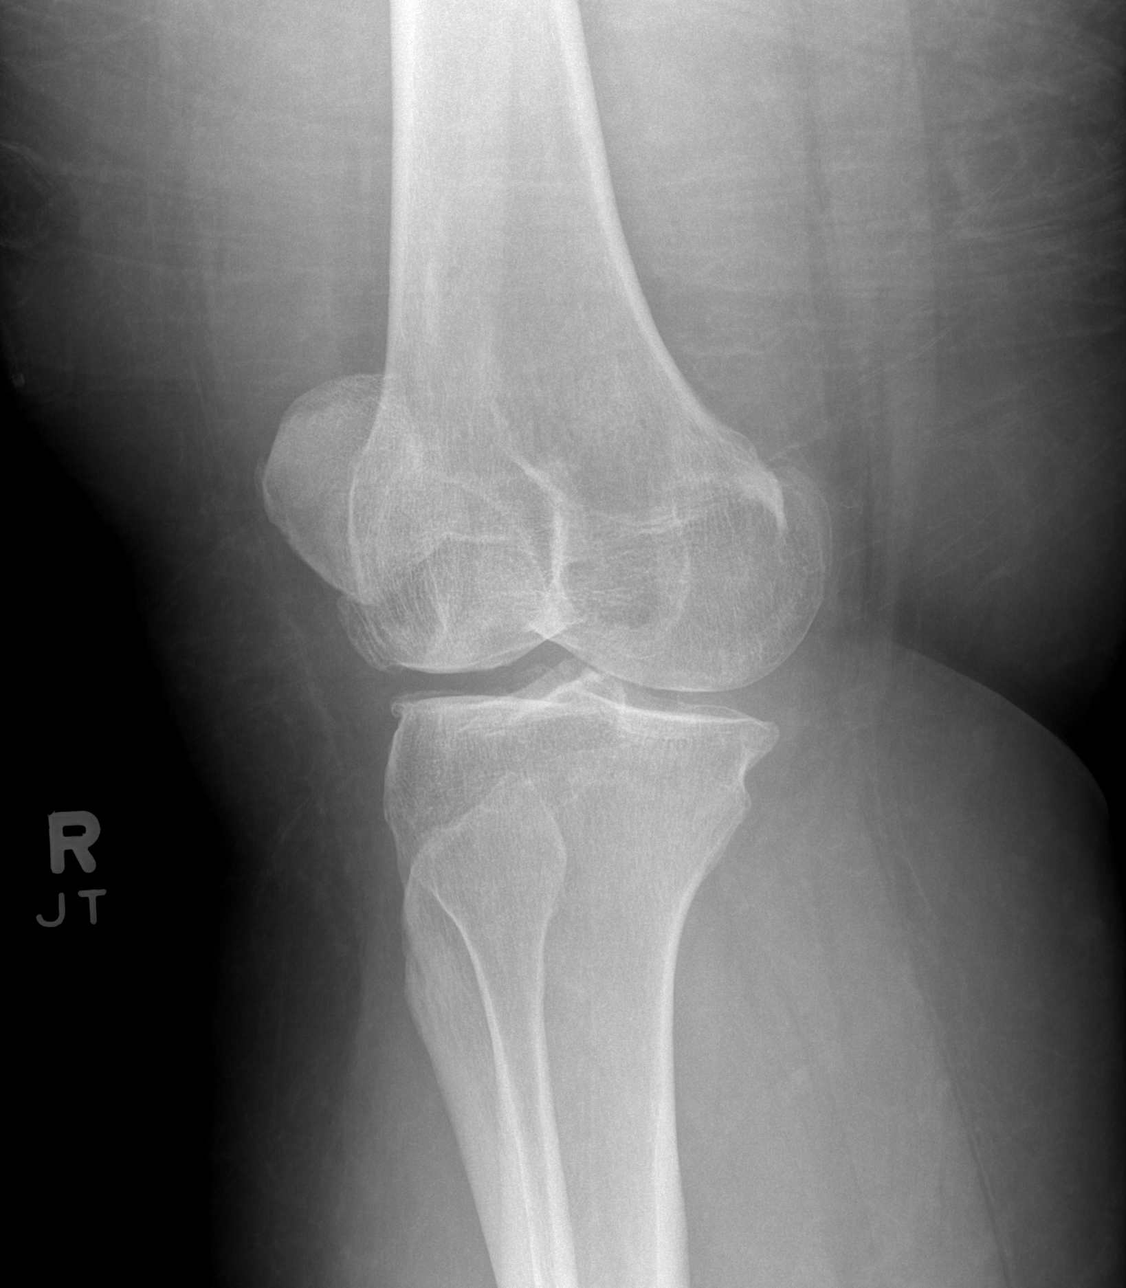

[t knee oblique right (2 of 2)]
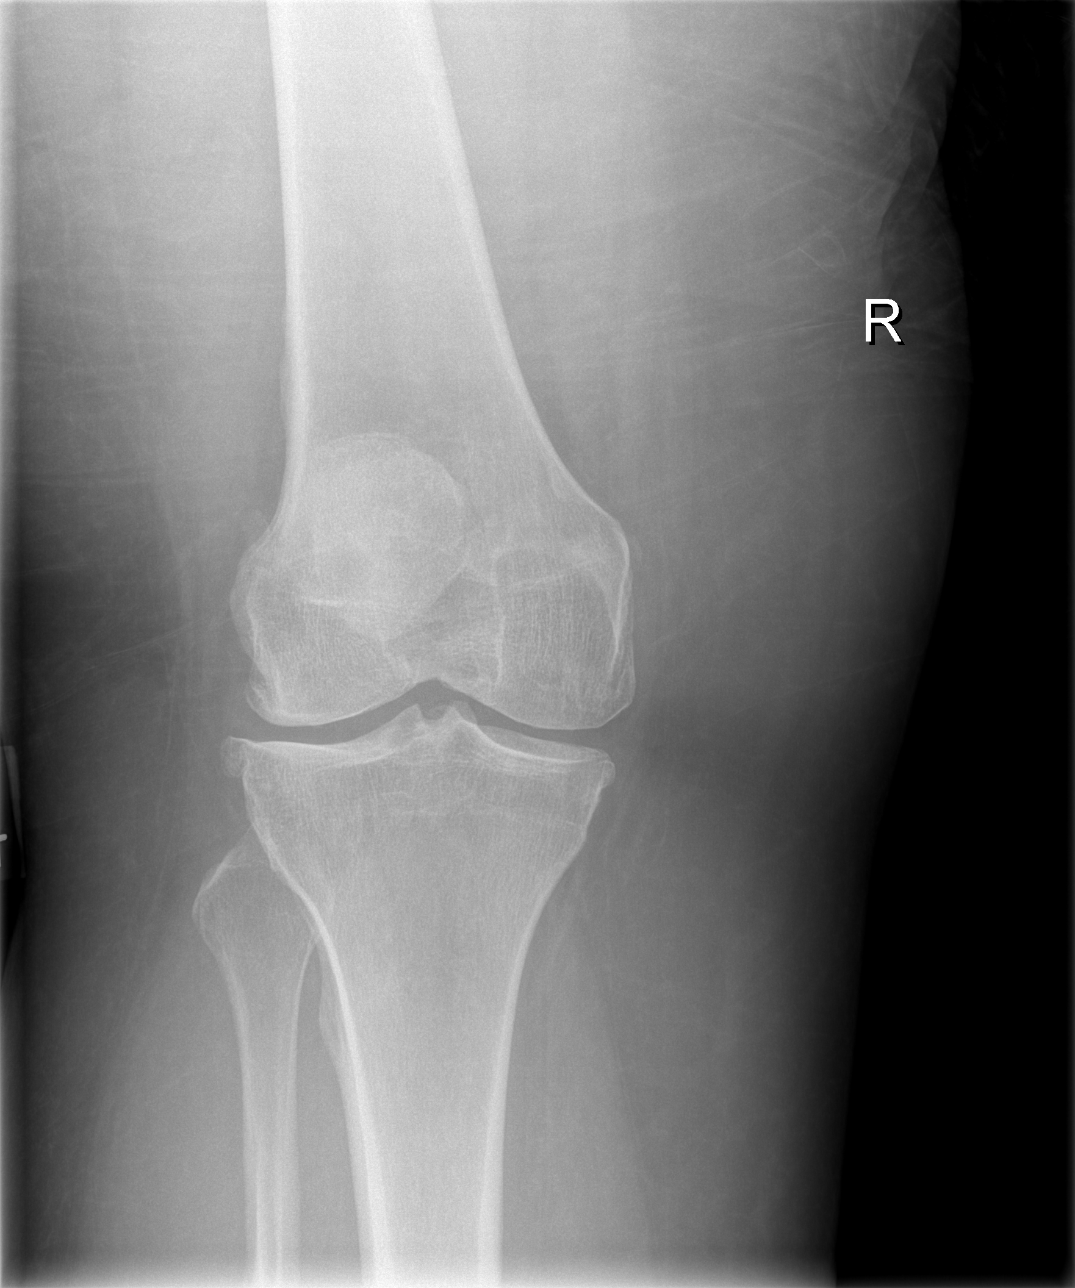

[t knee lat right]
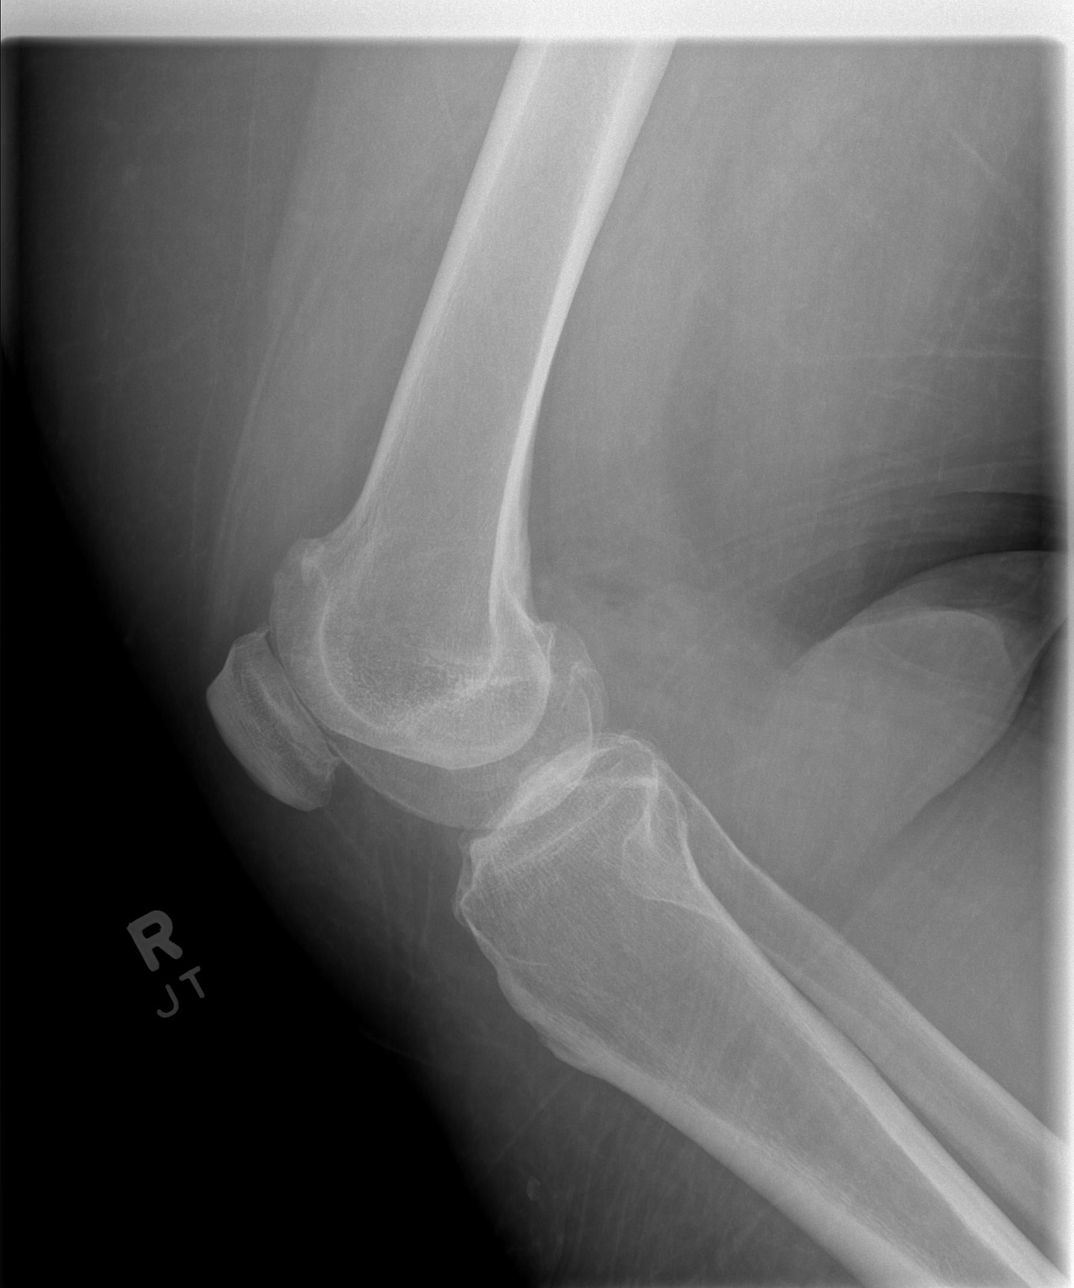

[4 of 4 positions shown; findings below may reference images not displayed]

FINDINGS: The patient has a joint effusion.  There is a tricompartmental osteoarthritis most pronounced in the lateral compartment with joint space narrowing and osteophyte formation. No sign of acute fracture. No sign of calcified loose body.
IMPRESSION: Osteoarthritis and joint effusion. Very similar in appearance to previous exam.

## 2009-08-13 IMAGING — CR DG CHEST 1V PORT
1 series · 1 of 1 positions shown · non-contrast
Comparison: 09/13/2006

CLINICAL DATA: Asthma and shortness of breath.

[view not recorded]
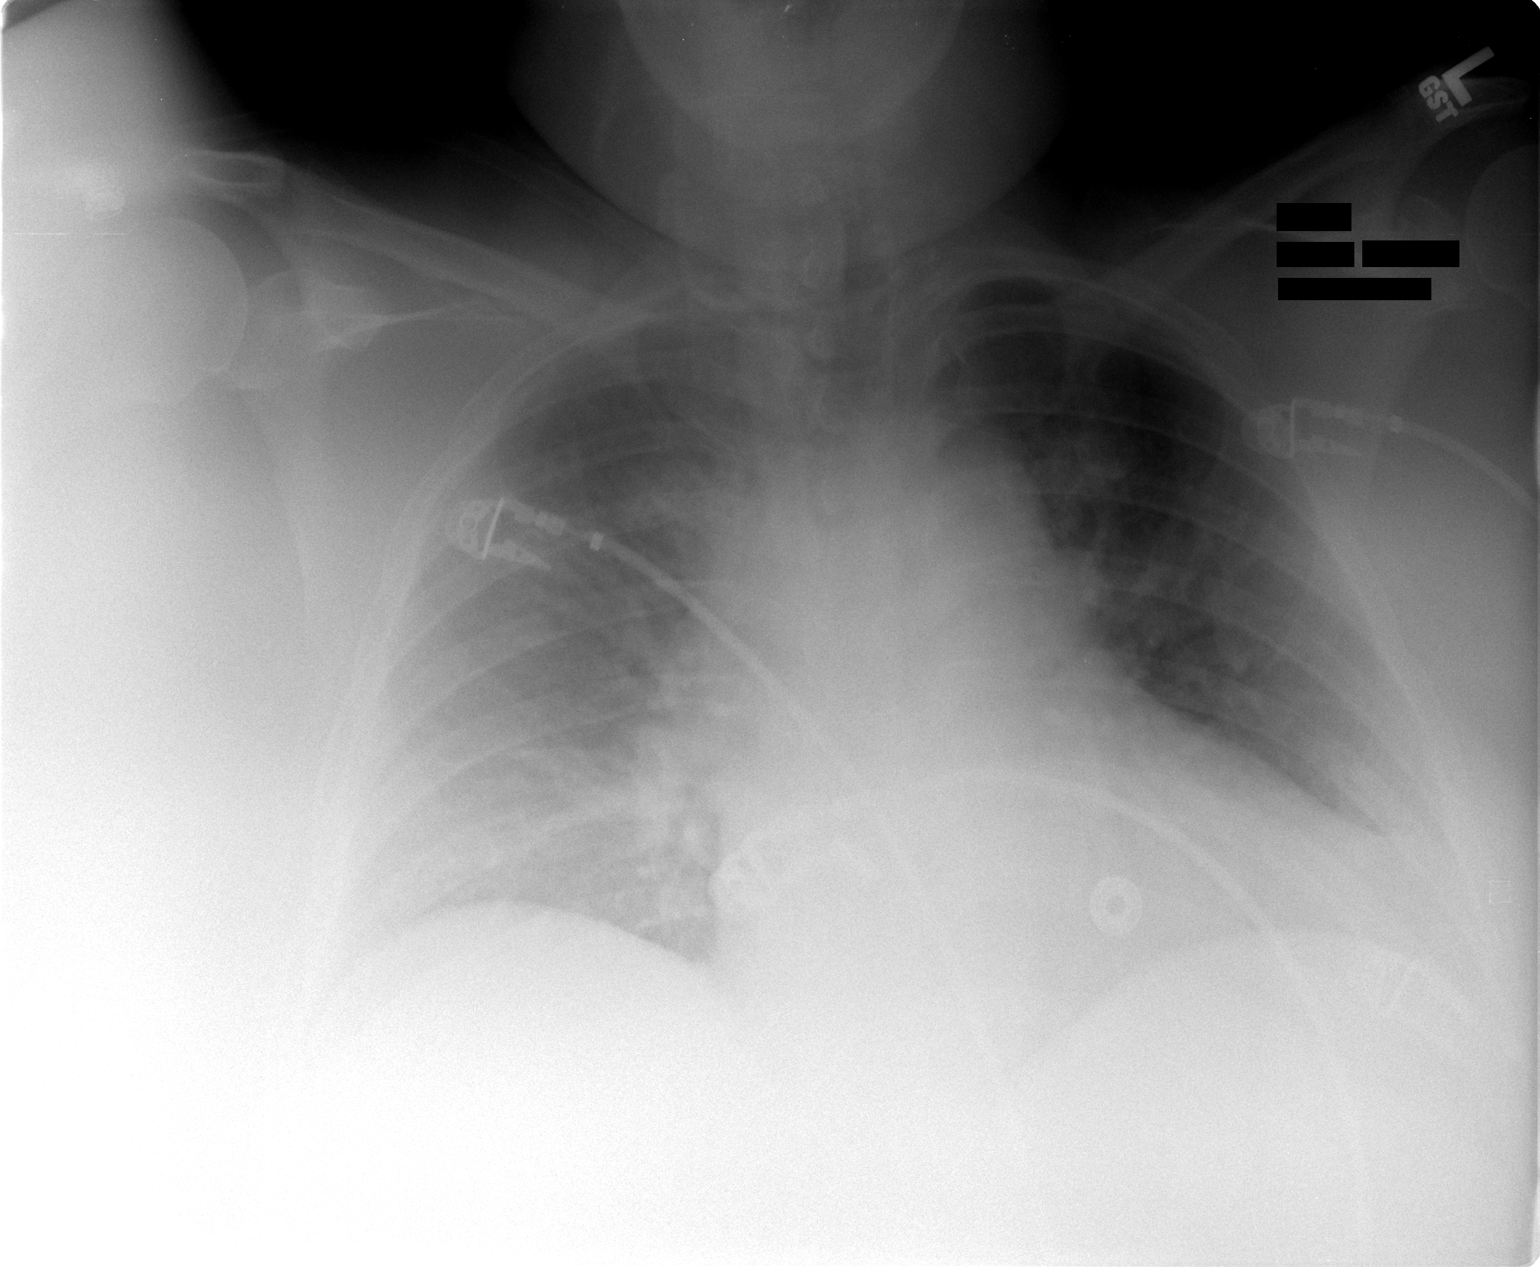

[1 of 1 positions shown; findings below may reference images not displayed]

PORTABLE CHEST - 1 VIEW:

9918 hours. Study limited by the extremely large body habitus and portable
technique. Lung lines are low. Cardiopericardial silhouette is enlarged.
Vascular congestion with probable atelectasis at both lung bases. Pulmonary
edema cannot be excluded. Telemetry leads overlie the chest.
IMPRESSION: Low lung volumes of vascular crowding and probable bibasilar atelectasis.
Pulmonary edema cannot be excluded. The study is limited by the patient's
extremely large body habitus and the portable technique.

## 2009-08-22 IMAGING — CT CT ANGIO CHEST
2 of 5 series · 19 of 36 positions shown · IV contrast ([ID] [ID])
Comparison: none

CLINICAL DATA: Shortness of breath, chest pain, chills

[Series 3: pe · axial · 0.80mm/px · z∈[-211,-16]mm · 16 of 178 slices shown]
[im 11/178  lung]
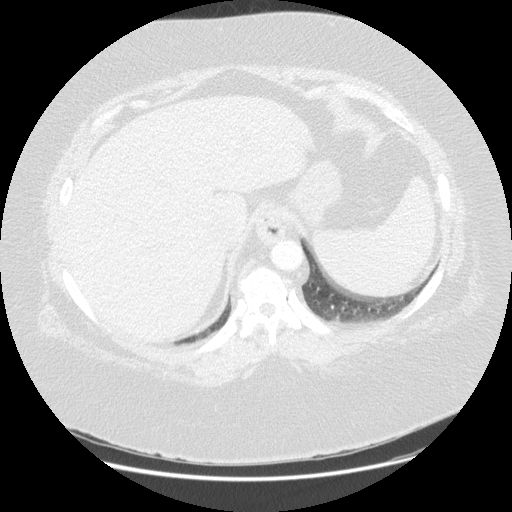
[im 21/178  mediastinal]
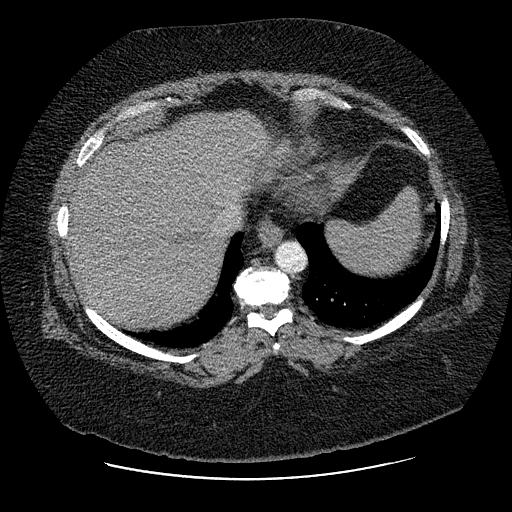
[im 32/178  lung]
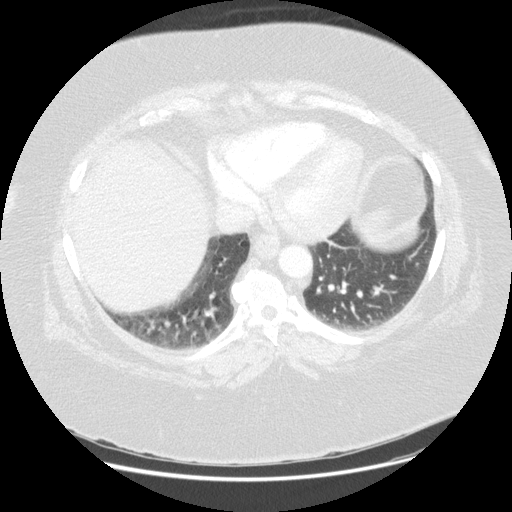
[im 42/178  mediastinal]
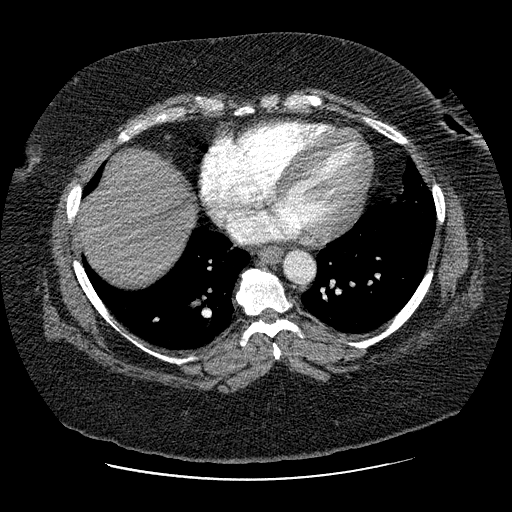
[im 53/178  lung]
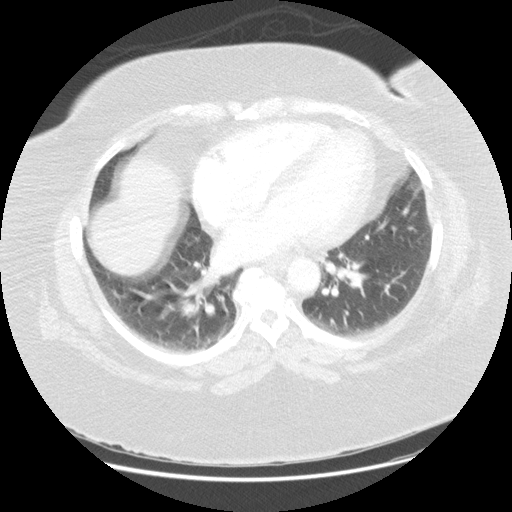
[im 63/178  mediastinal]
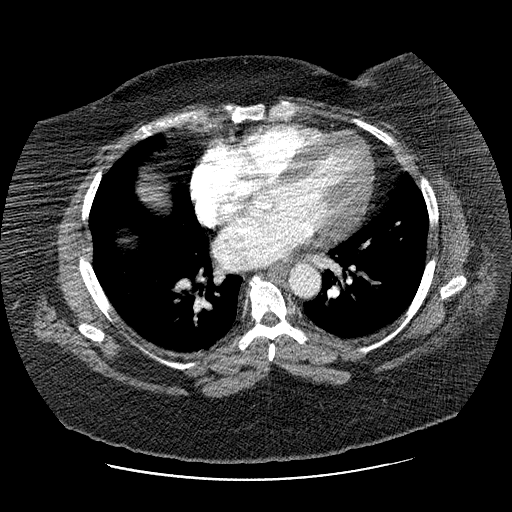
[im 73/178  lung]
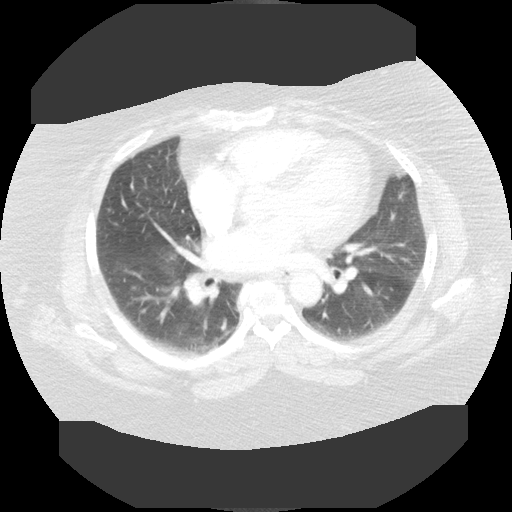
[im 84/178  mediastinal]
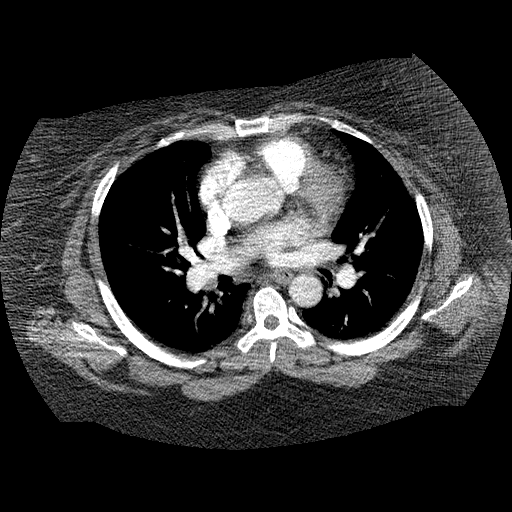
[im 94/178  lung]
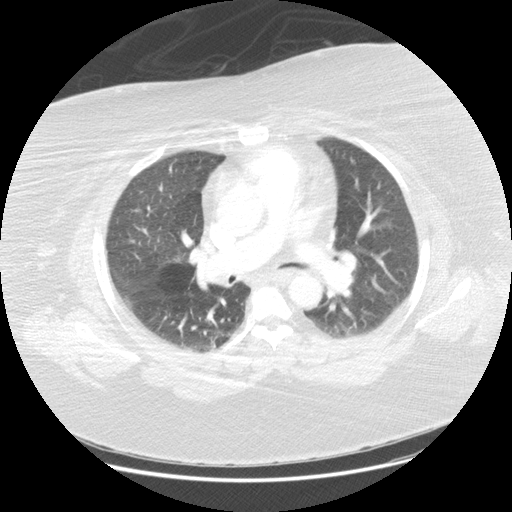
[im 105/178  mediastinal]
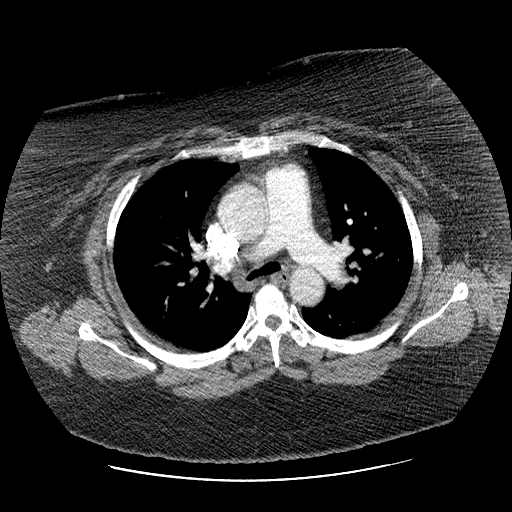
[im 115/178  lung]
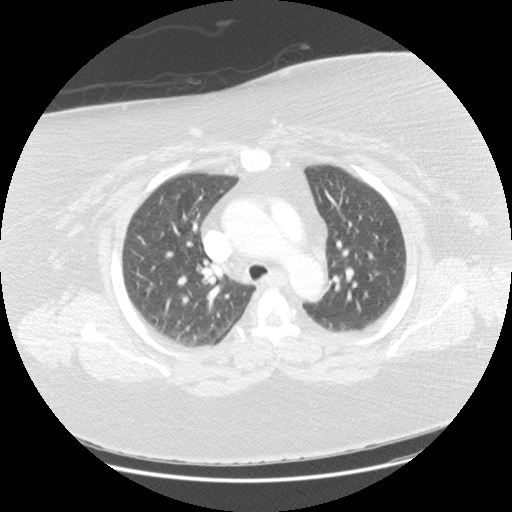
[im 125/178  mediastinal]
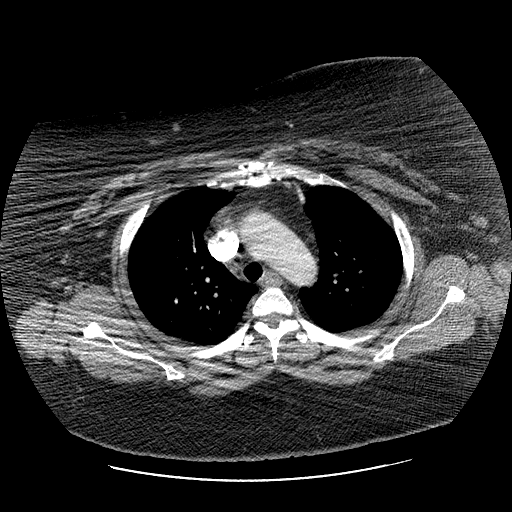
[im 136/178  lung]
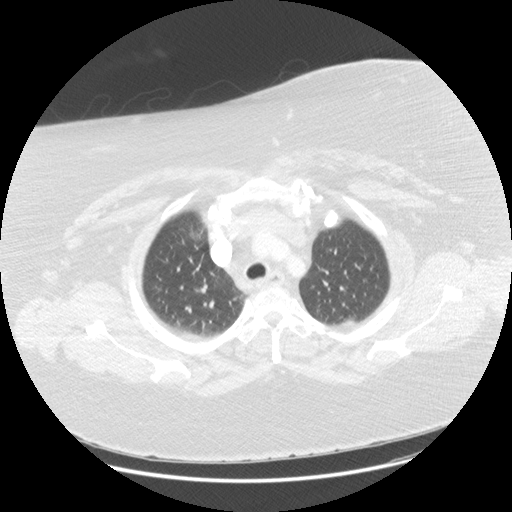
[im 146/178  mediastinal]
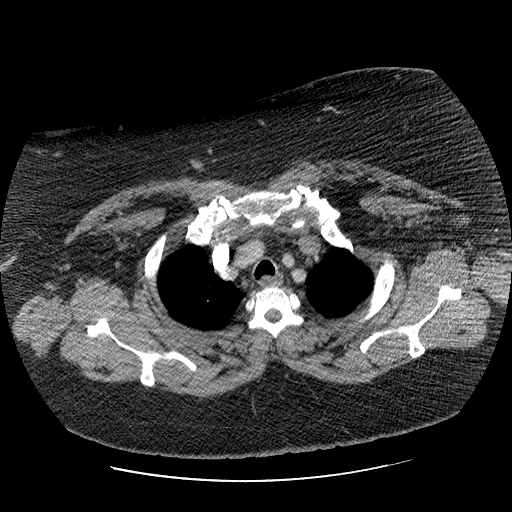
[im 157/178  lung]
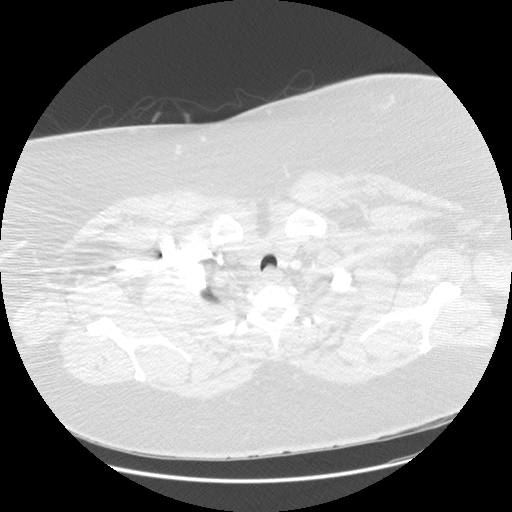
[im 167/178  mediastinal]
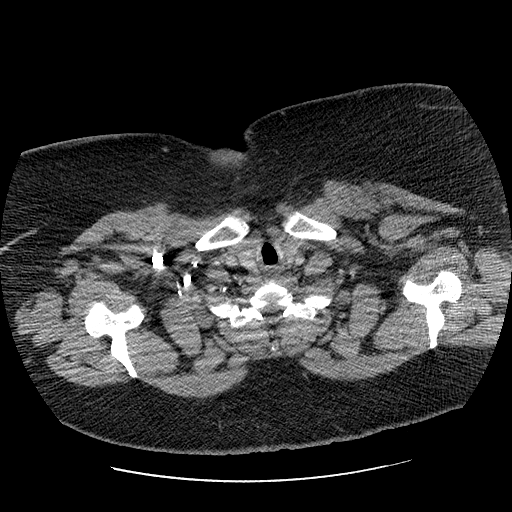

[Series 301: reformatted · coronal · 0.80mm/px · 3 of 124 slices shown]
[im 25/124  mediastinal]
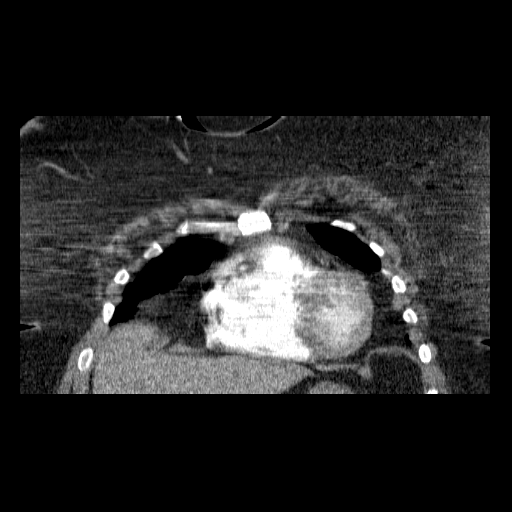
[im 50/124  mediastinal]
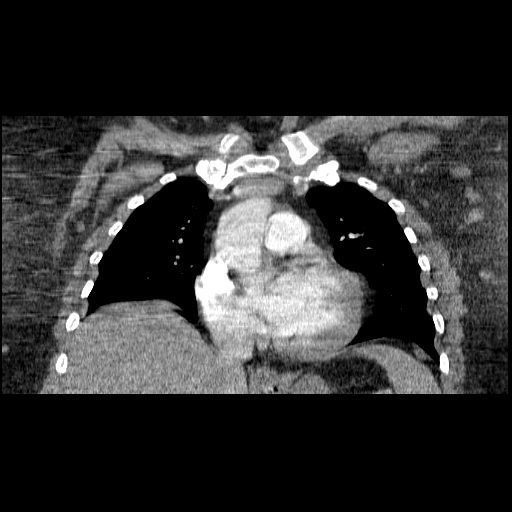
[im 74/124  mediastinal]
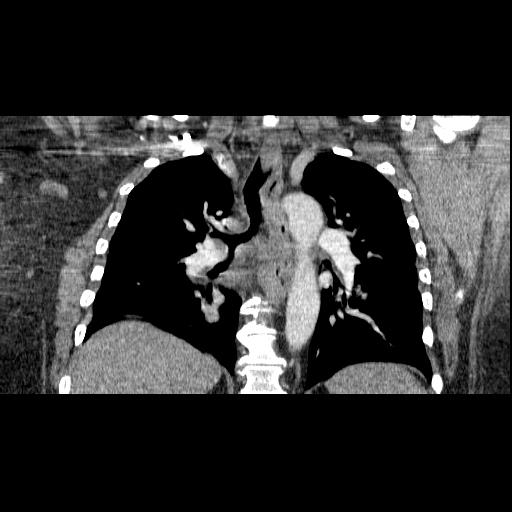

[19 of 36 positions shown; findings below may reference images not displayed]

CT angiogram chest with contrast:

Multidetector helical CT of the chest was obtained after 120 ml Omnipaque 350
IV. CT multiplanar reconstructions were rendered to evaluate the vascular
anatomy. 
Comparison 09/13/2006. There is fairly good contrast opacification of pulmonary
artery branches. Images somewhat degraded secondary to patient's body habitus.
No convincing filling defects to suggest acute PE. Unremarkable thoracic aorta
and origins of brachiocephalic vessels. No pleural or pericardial effusion. No
hilar or mediastinal adenopathy. Small hiatal hernia. Visualized upper abdomen
unremarkable.
Low lung volumes.
Minimal dependent atelectasis posteriorly in both lower lobes. Lungs are
otherwise clear. Mild spurring throughout the thoracic spine.
IMPRESSION: 1. Negative for acute PE or thoracic aortic dissection within the limits of the
study.
2. Small hiatal hernia.

## 2009-08-22 IMAGING — CR DG CHEST 2V
2 series · 2 of 2 positions shown · non-contrast
Comparison: 01/27/07.

CLINICAL DATA: Shortness of breath.  Left chest pain.  
 CHEST ? 2 VIEW:

[w chest pa]
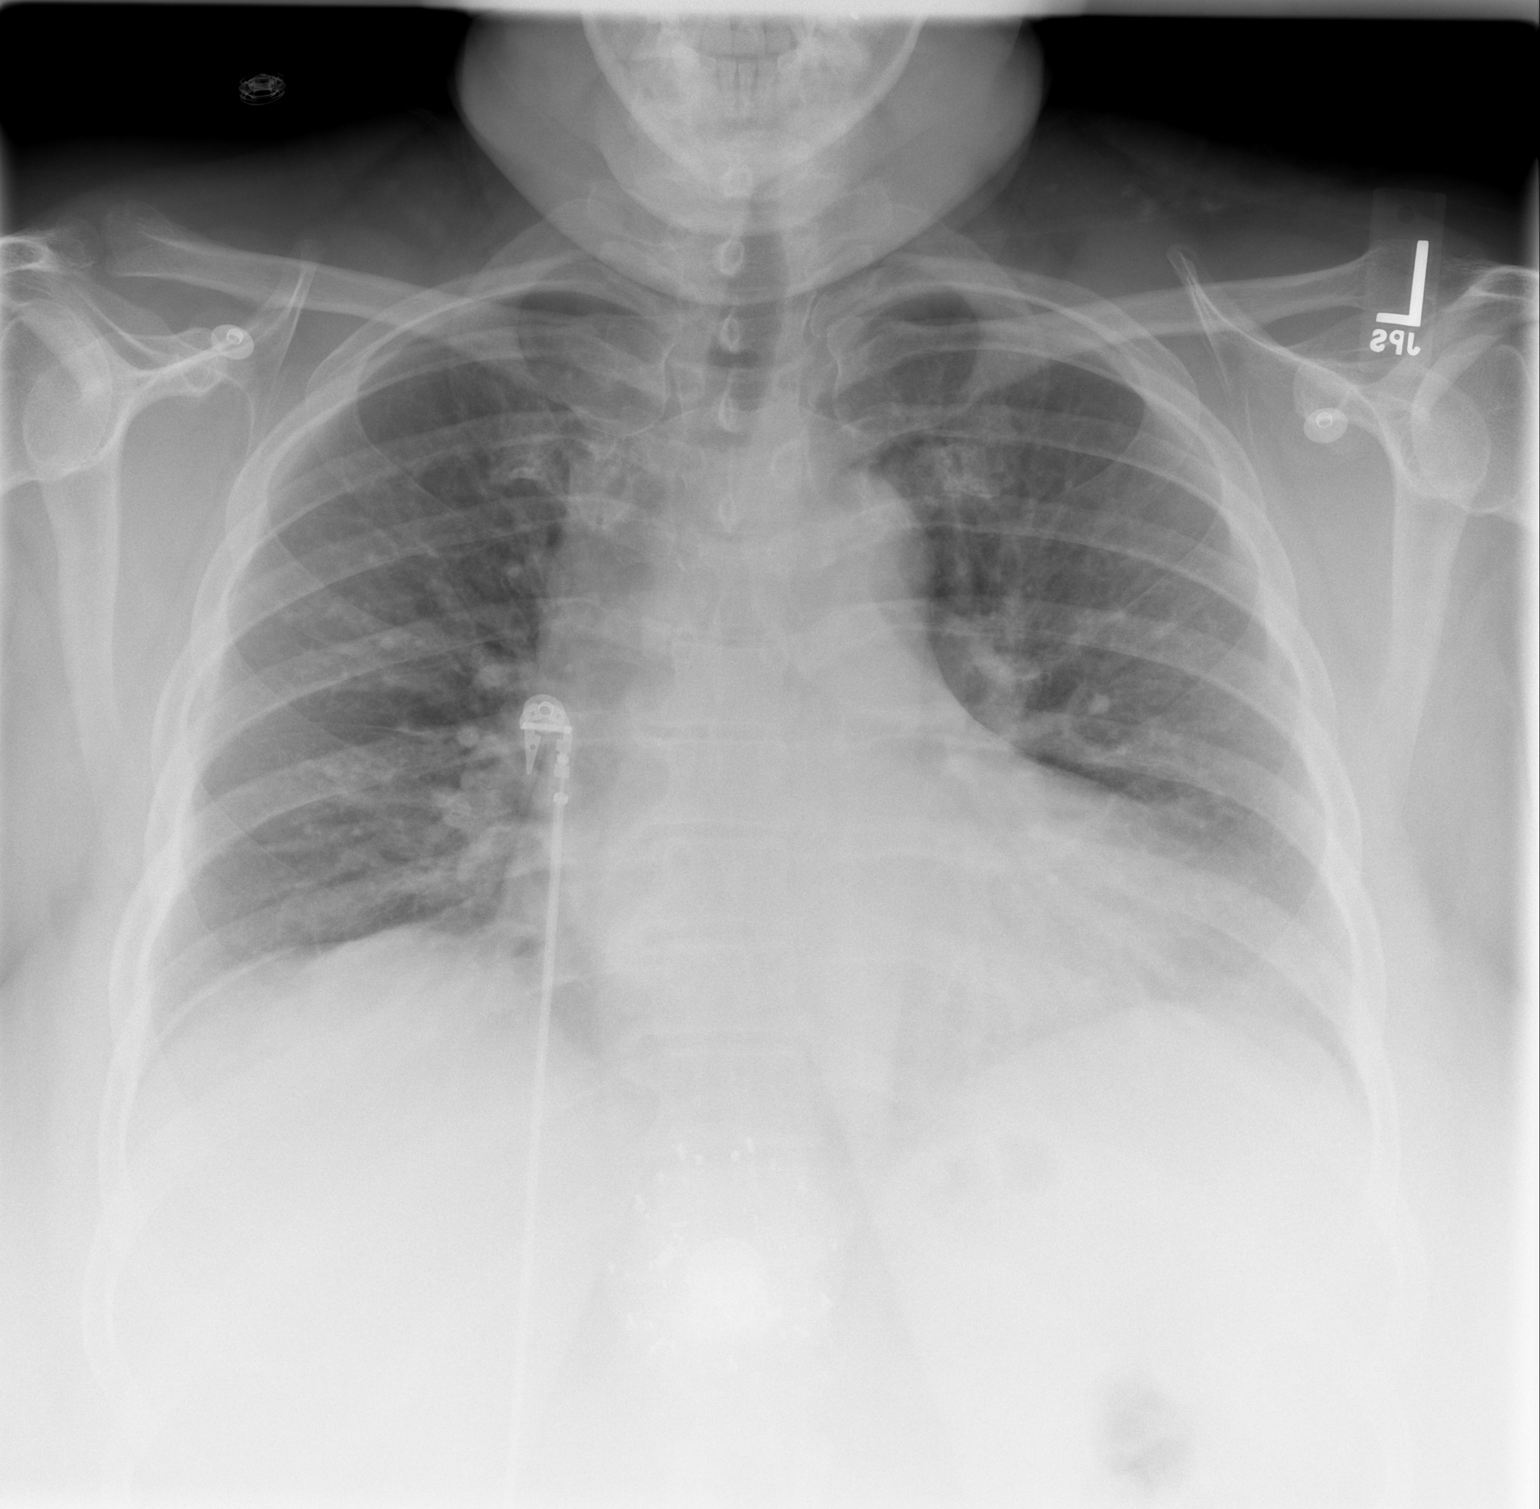

[w chest lat]
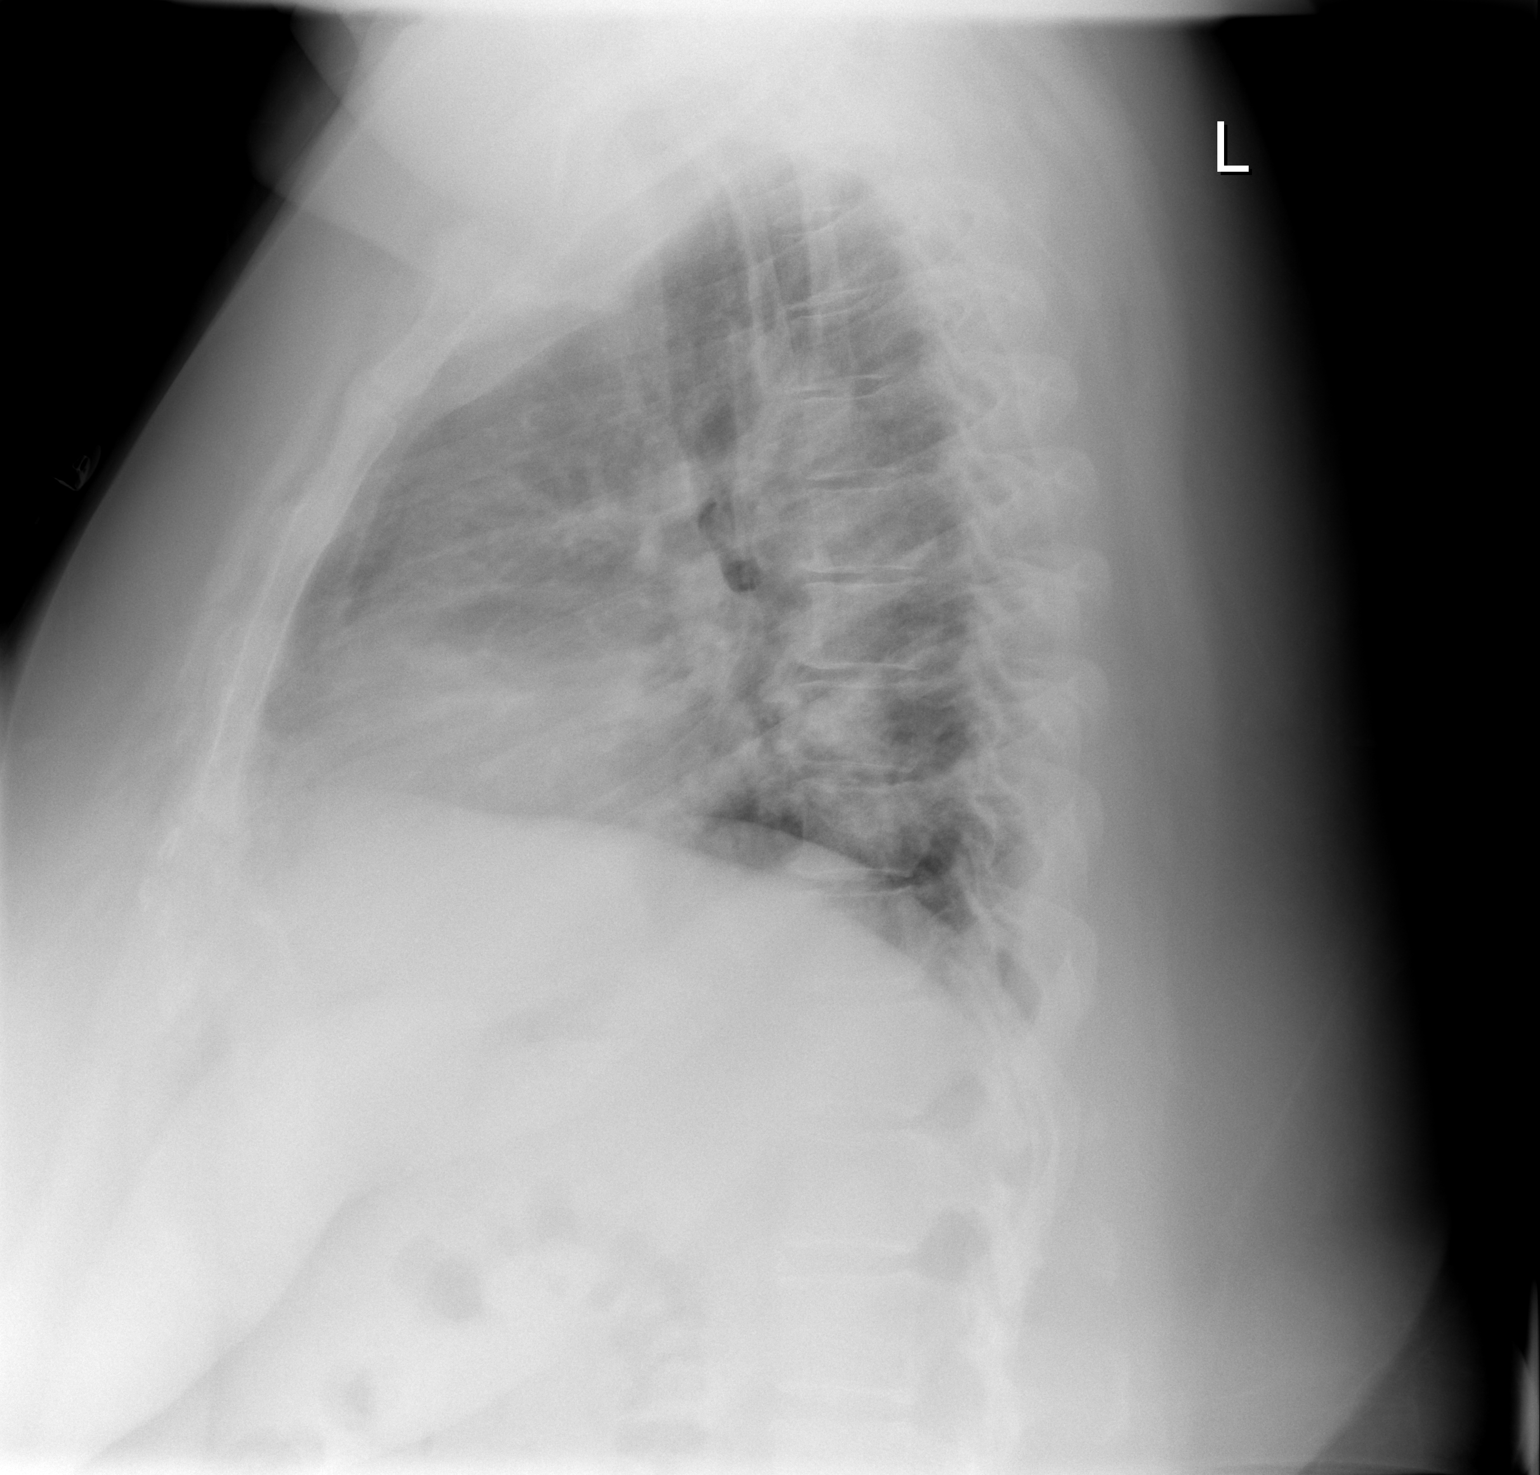

[2 of 2 positions shown; findings below may reference images not displayed]

FINDINGS: The heart is enlarged and there is vascular congestion compatible with mild fluid overload.  No edema or effusion is present.  Overall the appearance is similar to the prior chest x-ray.
IMPRESSION: Cardiac enlargement with vascular congestion.  Negative for pneumonia.

## 2009-09-16 ENCOUNTER — Emergency Department (HOSPITAL_COMMUNITY): Admission: EM | Admit: 2009-09-16 | Discharge: 2009-09-17 | Payer: Self-pay | Admitting: Emergency Medicine

## 2009-10-04 ENCOUNTER — Telehealth: Payer: Self-pay | Admitting: Internal Medicine

## 2009-10-09 ENCOUNTER — Emergency Department (HOSPITAL_COMMUNITY): Admission: EM | Admit: 2009-10-09 | Discharge: 2009-10-10 | Payer: Self-pay | Admitting: Emergency Medicine

## 2009-10-15 ENCOUNTER — Encounter: Payer: Self-pay | Admitting: Internal Medicine

## 2009-10-18 ENCOUNTER — Encounter: Payer: Self-pay | Admitting: Internal Medicine

## 2009-10-18 ENCOUNTER — Emergency Department (HOSPITAL_COMMUNITY): Admission: EM | Admit: 2009-10-18 | Discharge: 2009-10-18 | Payer: Self-pay | Admitting: Emergency Medicine

## 2009-10-19 ENCOUNTER — Ambulatory Visit: Payer: Self-pay | Admitting: Internal Medicine

## 2009-10-19 ENCOUNTER — Inpatient Hospital Stay (HOSPITAL_COMMUNITY): Admission: AD | Admit: 2009-10-19 | Discharge: 2009-10-20 | Payer: Self-pay | Admitting: Internal Medicine

## 2009-10-19 ENCOUNTER — Encounter: Payer: Self-pay | Admitting: Internal Medicine

## 2009-10-20 ENCOUNTER — Encounter: Payer: Self-pay | Admitting: Internal Medicine

## 2009-10-31 ENCOUNTER — Emergency Department (HOSPITAL_COMMUNITY): Admission: EM | Admit: 2009-10-31 | Discharge: 2009-10-31 | Payer: Self-pay | Admitting: Emergency Medicine

## 2009-11-21 ENCOUNTER — Emergency Department (HOSPITAL_COMMUNITY): Admission: EM | Admit: 2009-11-21 | Discharge: 2009-11-21 | Payer: Self-pay | Admitting: Emergency Medicine

## 2009-12-04 ENCOUNTER — Emergency Department (HOSPITAL_COMMUNITY): Admission: EM | Admit: 2009-12-04 | Discharge: 2009-12-05 | Payer: Self-pay | Admitting: Emergency Medicine

## 2009-12-05 ENCOUNTER — Ambulatory Visit: Payer: Self-pay | Admitting: Internal Medicine

## 2009-12-05 ENCOUNTER — Telehealth: Payer: Self-pay | Admitting: *Deleted

## 2009-12-05 LAB — CONVERTED CEMR LAB
Blood in Urine, dipstick: NEGATIVE
Nitrite: NEGATIVE
Protein, U semiquant: 30
WBC Urine, dipstick: NEGATIVE

## 2009-12-05 IMAGING — CR DG CHEST 2V
2 series · 2 of 2 positions shown · non-contrast
Comparison: 02/03/2007

CLINICAL DATA: Short of breath, cough, headaches. 
 CHEST - 2 VIEW:

[w chest pa]
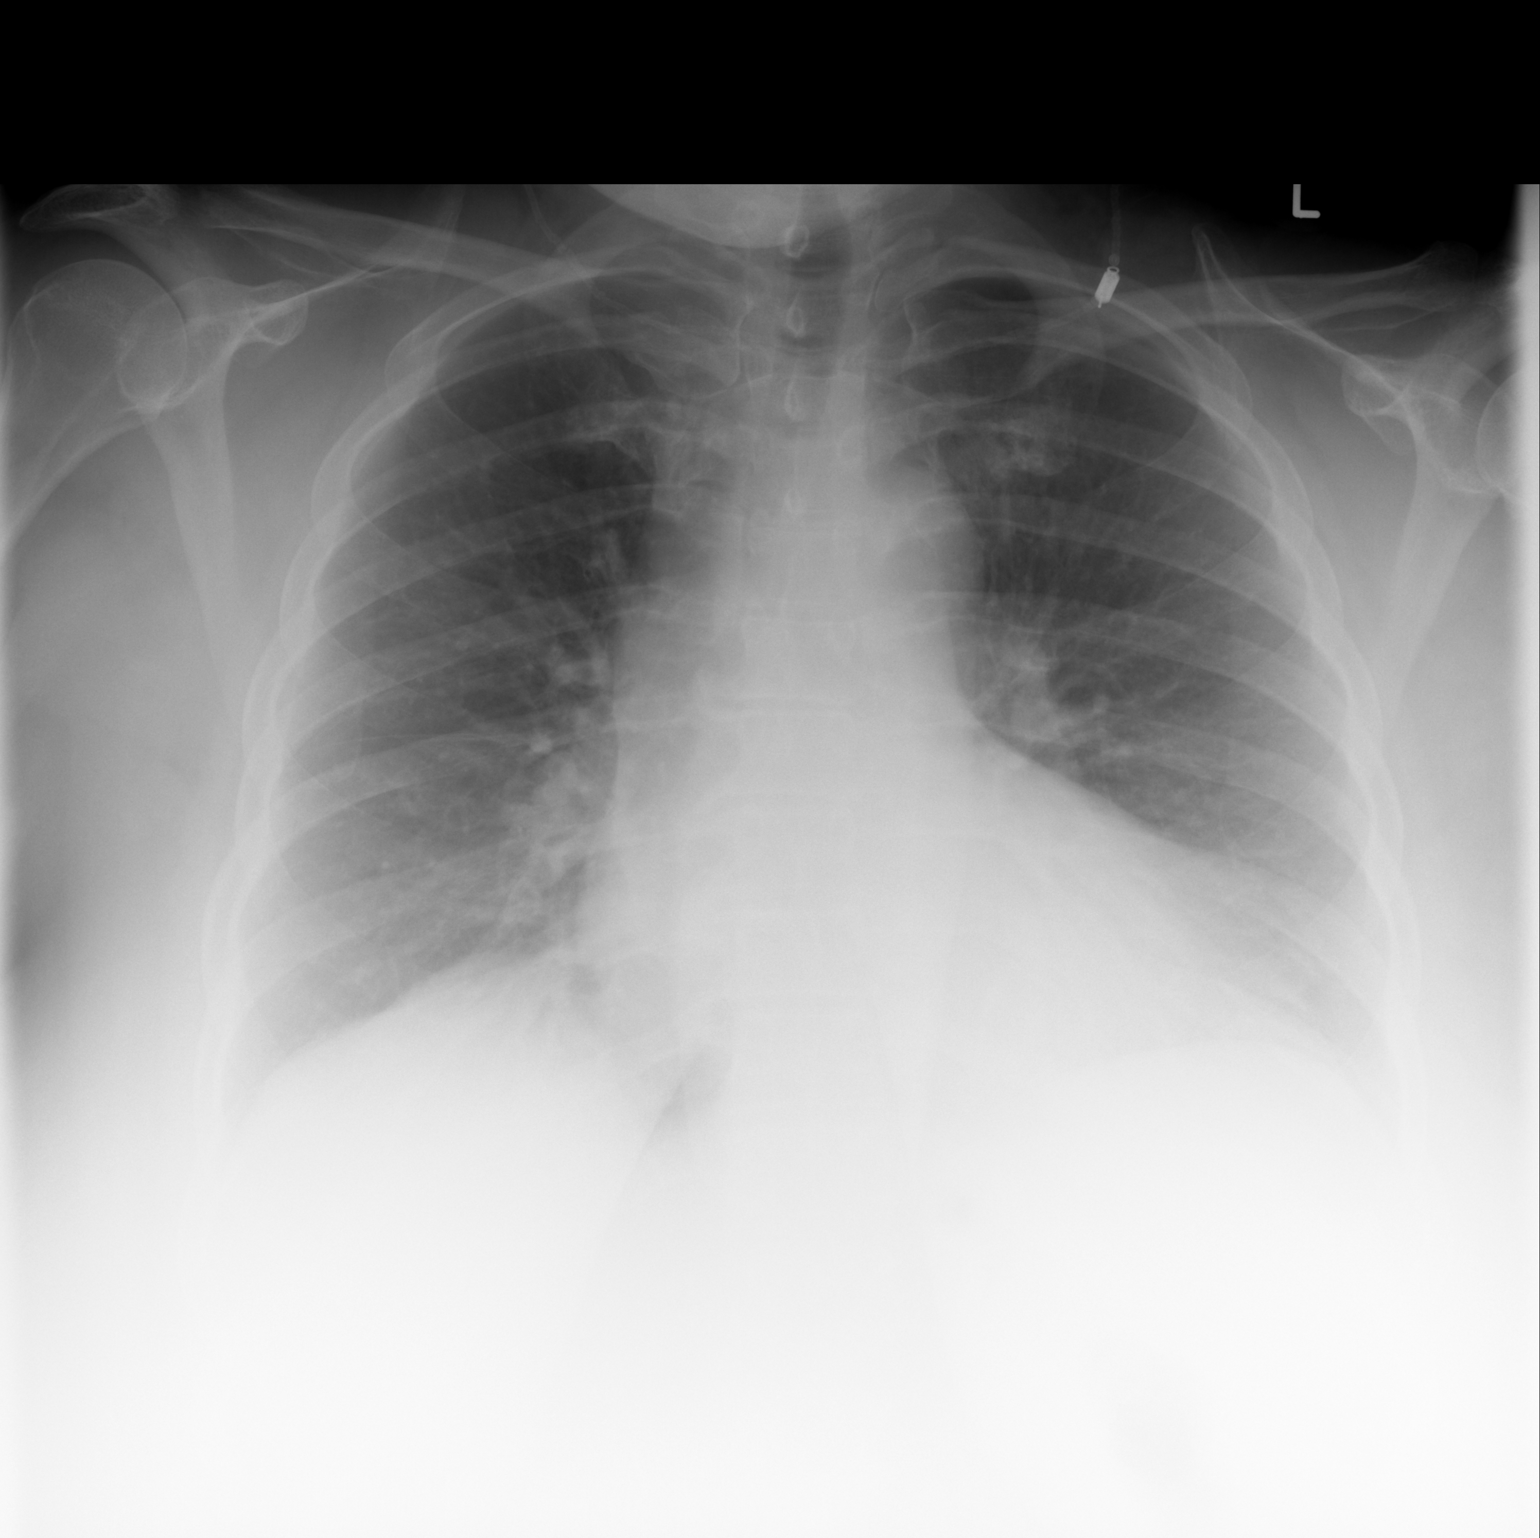

[w chest lat]
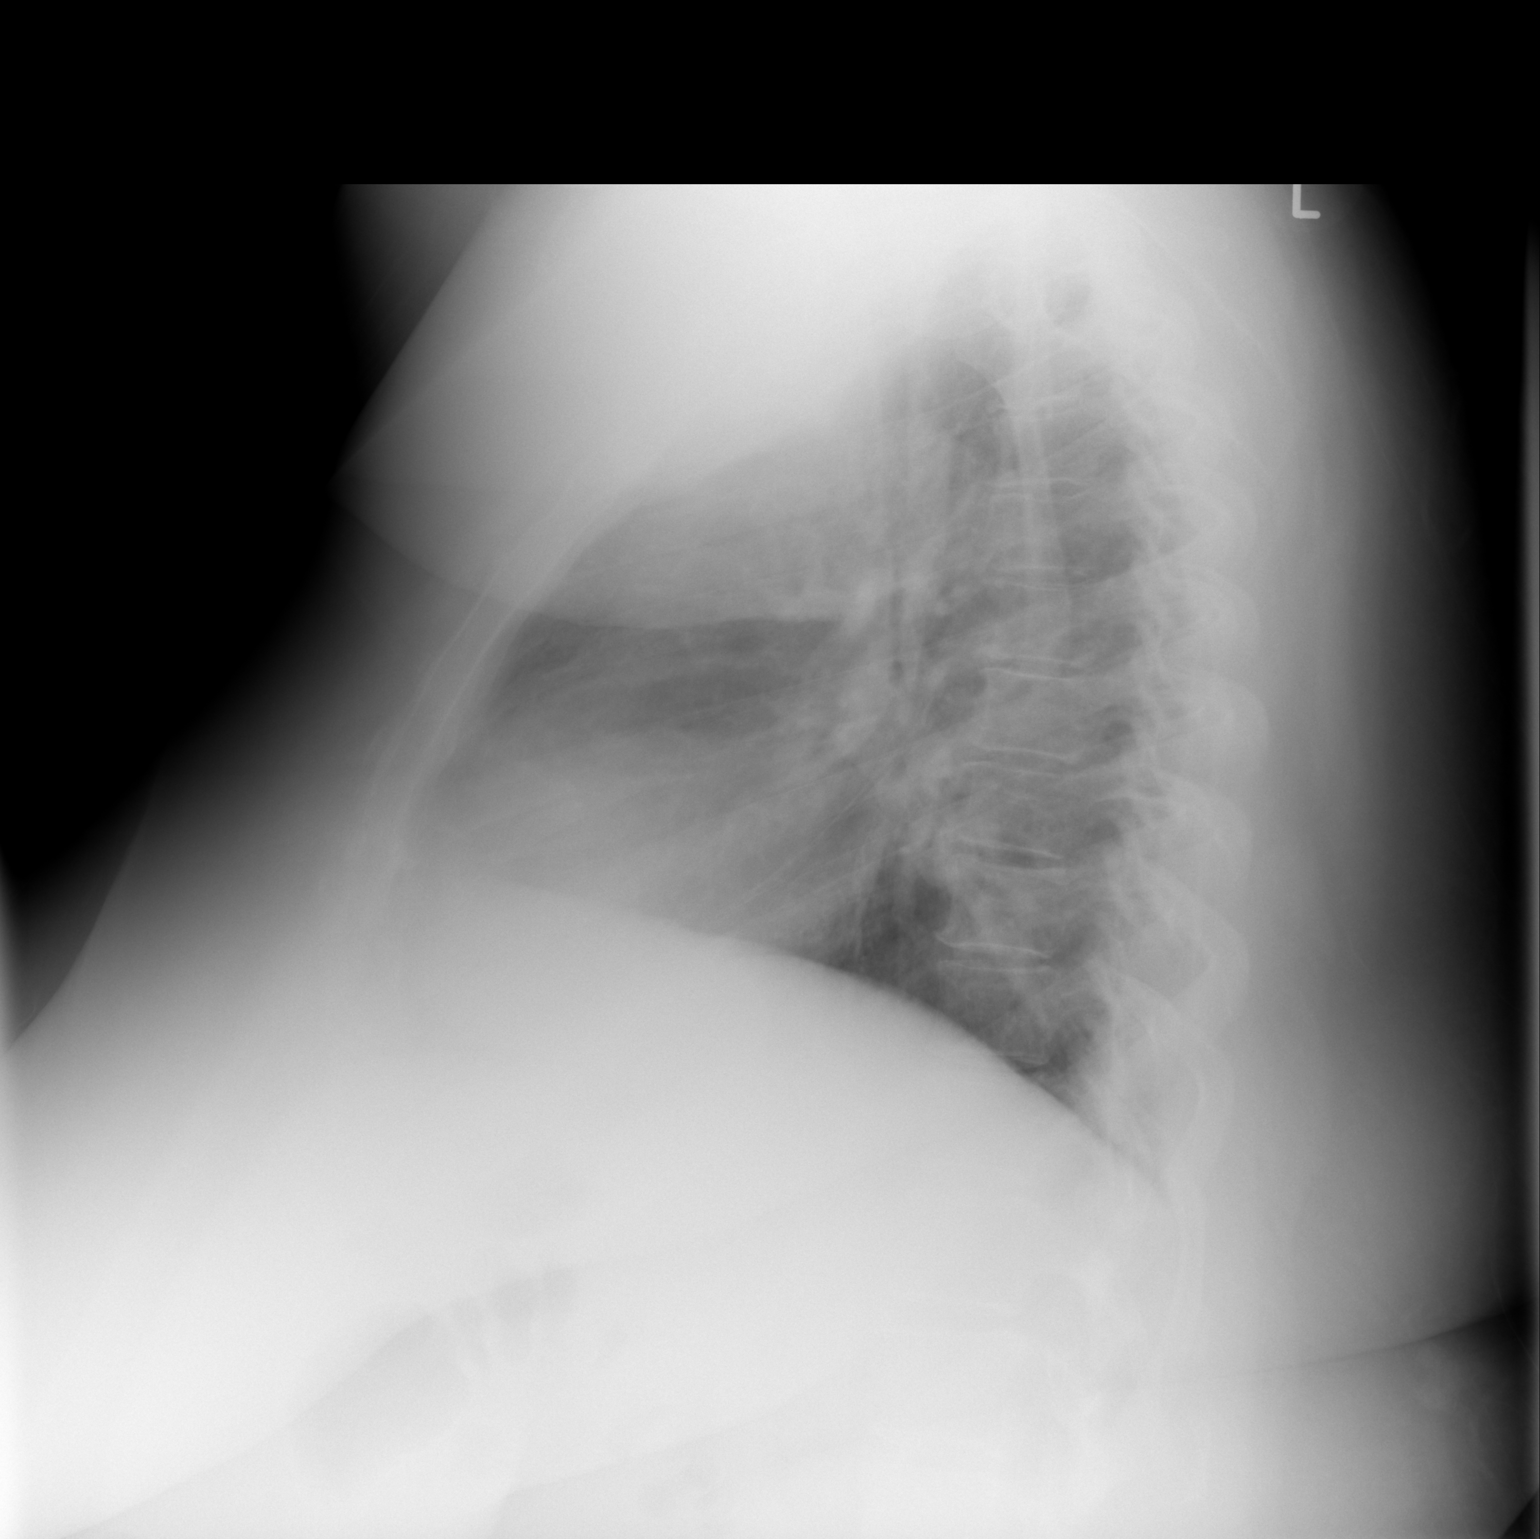

[2 of 2 positions shown; findings below may reference images not displayed]

FINDINGS: Heart size is normal.  The mediastinum is unremarkable.  The lungs are clear.   No effusions.    Bony structures are unremarkable.
IMPRESSION: No active disease.

## 2009-12-22 IMAGING — CR DG CHEST 2V
1 series · 1 of 1 positions shown · non-contrast
Comparison: 05/19/07.

CLINICAL DATA: SOB, weakness. 
 CHEST - 2 VIEW:

[view not recorded]
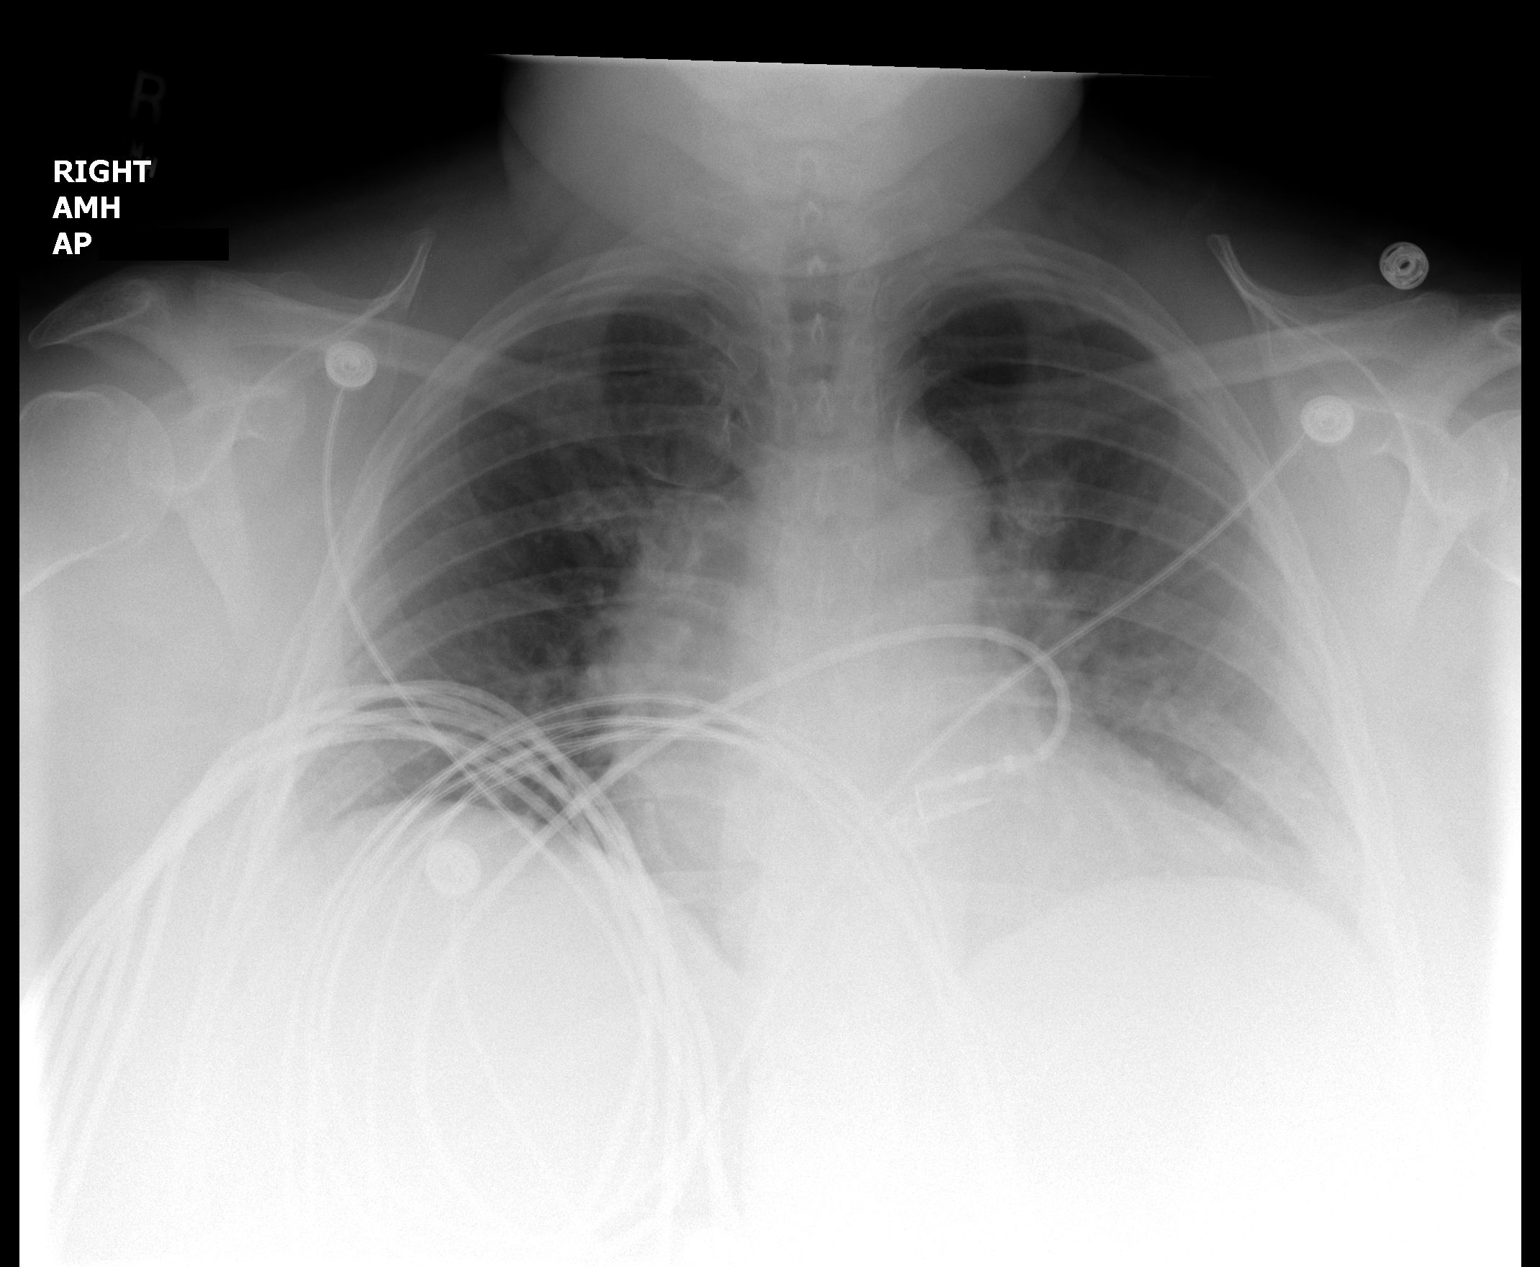

[1 of 1 positions shown; findings below may reference images not displayed]

FINDINGS: The heart size is normal.  
 The lung volumes are very low.
 No interstitial or airspace opacities are identified.
IMPRESSION: 1. Very low lung volumes.  
 2.  No acute findings.

## 2010-01-30 IMAGING — CR DG HIP COMPLETE 2+V*R*
3 series · 3 of 3 positions shown · non-contrast
Comparison: No prior studies.

CLINICAL DATA: The patient fell December 2006.  Persistent pain.

RIGHT HIP - COMPLETE 2+ VIEW

[t pelvis a.p.]
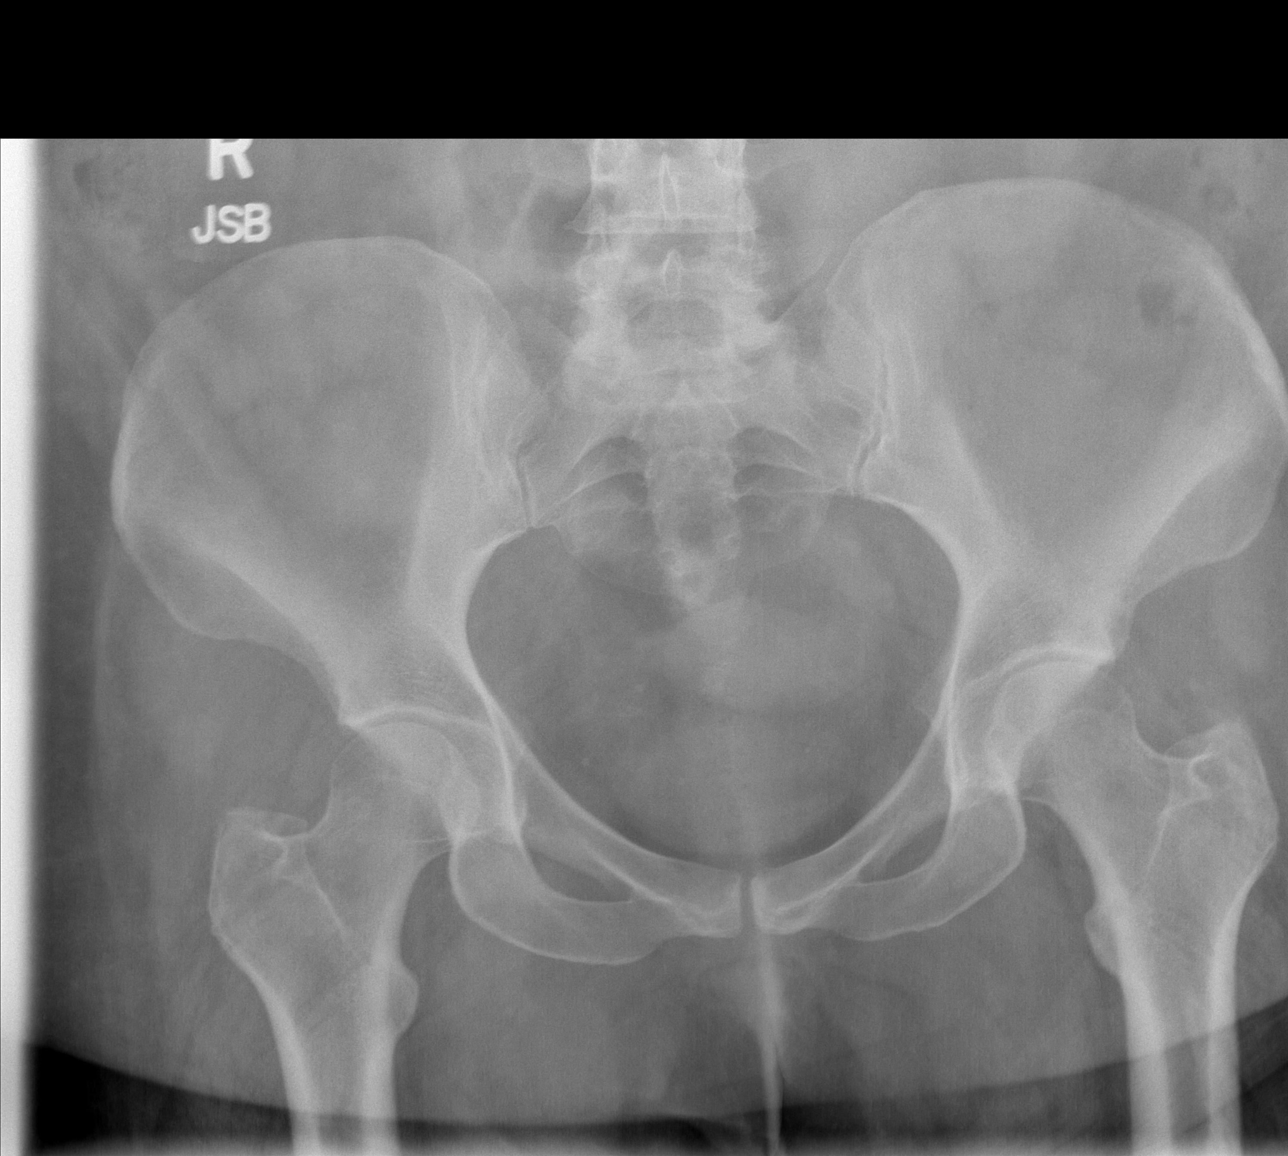

[t hip ap right]
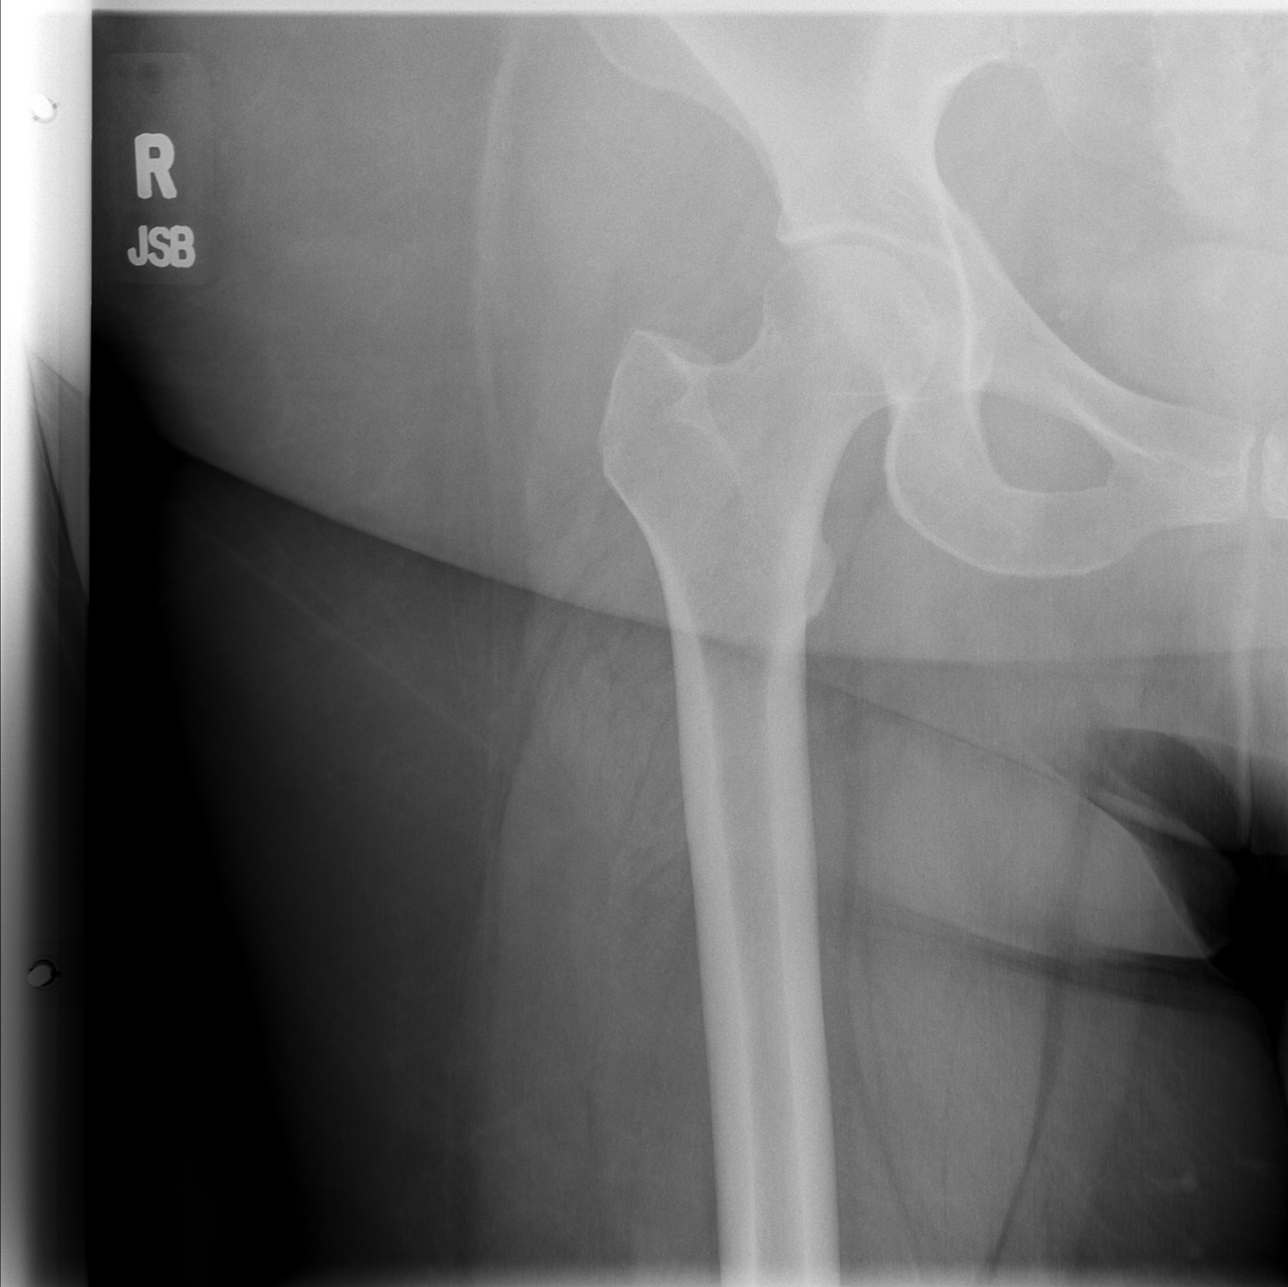

[t hip frog leg right]
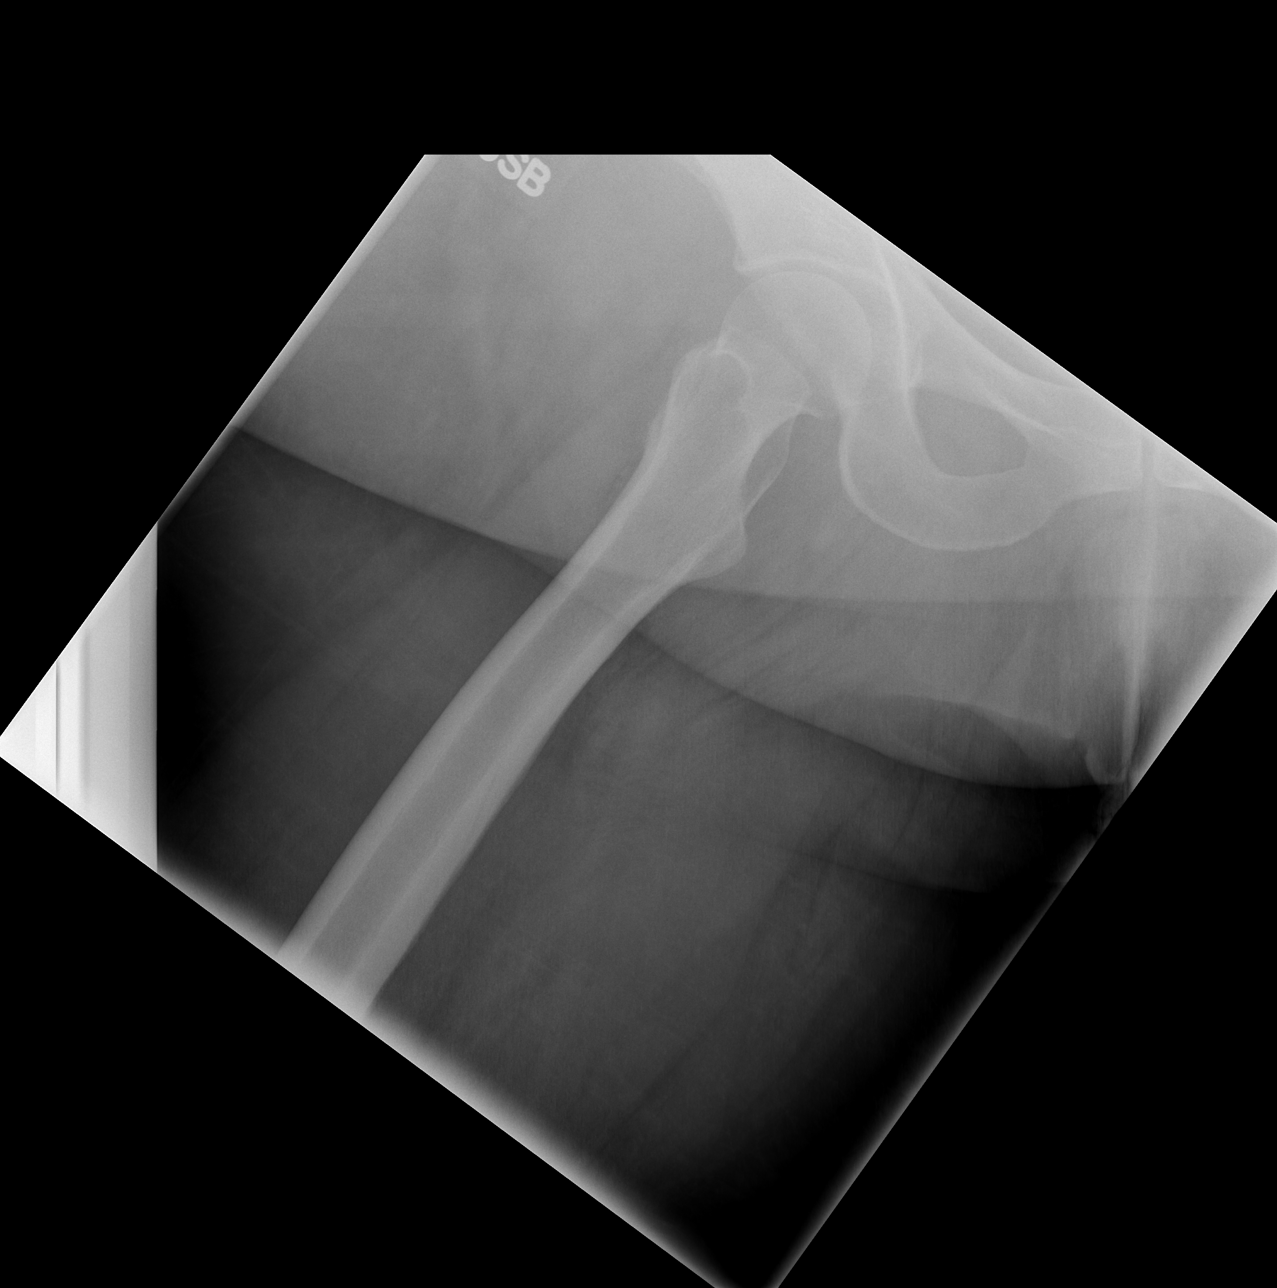

[3 of 3 positions shown; findings below may reference images not displayed]

FINDINGS: There is mild joint space narrowing associated with the
hips bilaterally.  There are no fractures or dislocations.  There
are no destructive changes.
IMPRESSION: The joint space narrowing associated with the hips bilaterally.  No
acute findings.

## 2010-02-12 ENCOUNTER — Emergency Department (HOSPITAL_COMMUNITY): Admission: EM | Admit: 2010-02-12 | Discharge: 2010-02-13 | Payer: Self-pay | Admitting: Emergency Medicine

## 2010-02-25 ENCOUNTER — Emergency Department (HOSPITAL_COMMUNITY)
Admission: EM | Admit: 2010-02-25 | Discharge: 2010-02-26 | Payer: Self-pay | Source: Home / Self Care | Admitting: Emergency Medicine

## 2010-02-28 ENCOUNTER — Emergency Department (HOSPITAL_COMMUNITY): Admission: EM | Admit: 2010-02-28 | Discharge: 2009-11-01 | Payer: Self-pay | Admitting: Emergency Medicine

## 2010-02-28 ENCOUNTER — Emergency Department (HOSPITAL_COMMUNITY): Admission: EM | Admit: 2010-02-28 | Discharge: 2009-04-27 | Payer: Self-pay | Admitting: Emergency Medicine

## 2010-03-12 ENCOUNTER — Telehealth: Payer: Self-pay | Admitting: Internal Medicine

## 2010-03-20 IMAGING — CR DG CHEST 2V
2 series · 2 of 2 positions shown · non-contrast
Comparison: 06/05/2007.

CLINICAL DATA: Shortness of breath.

CHEST - 2 VIEW

[w chest lat *]
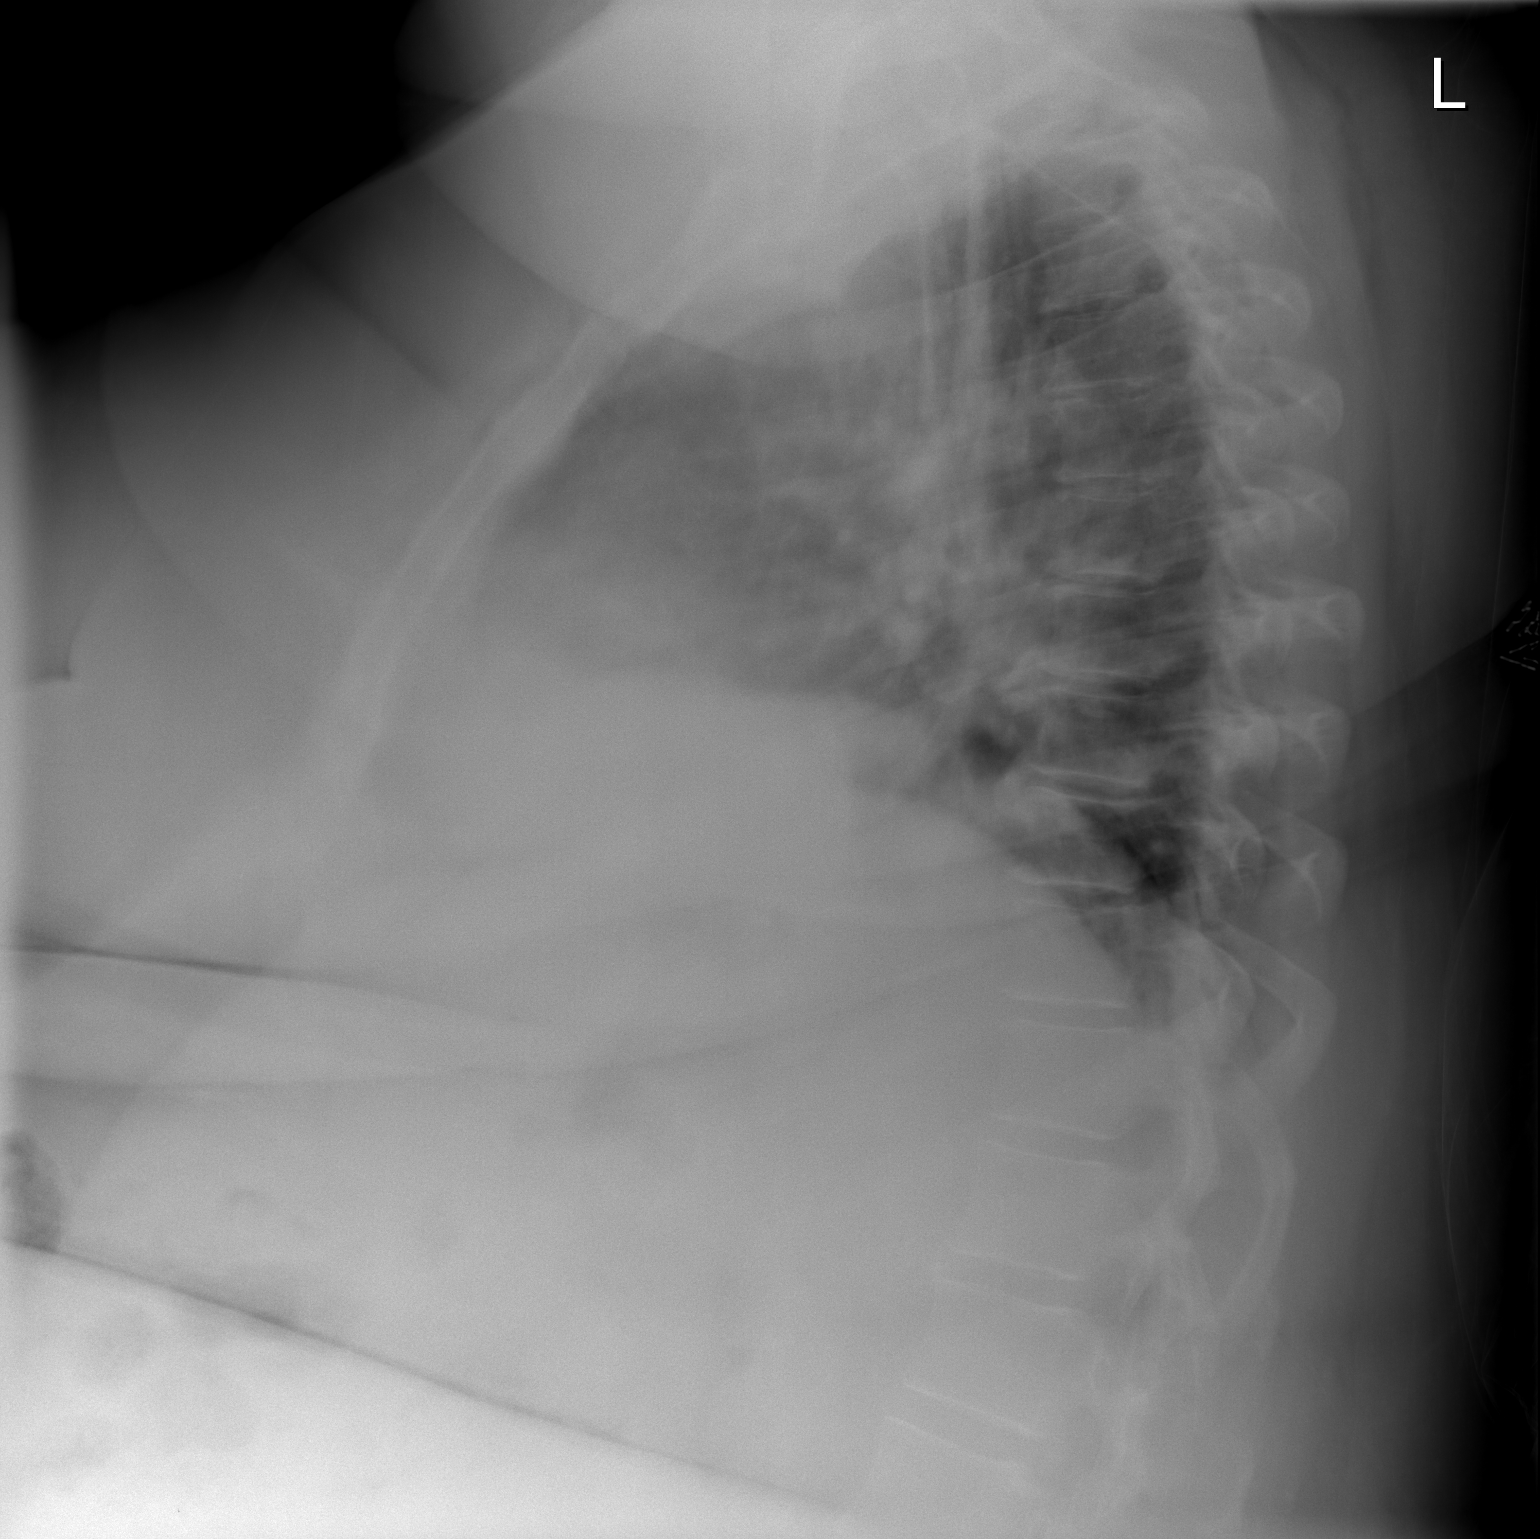

[view not recorded]
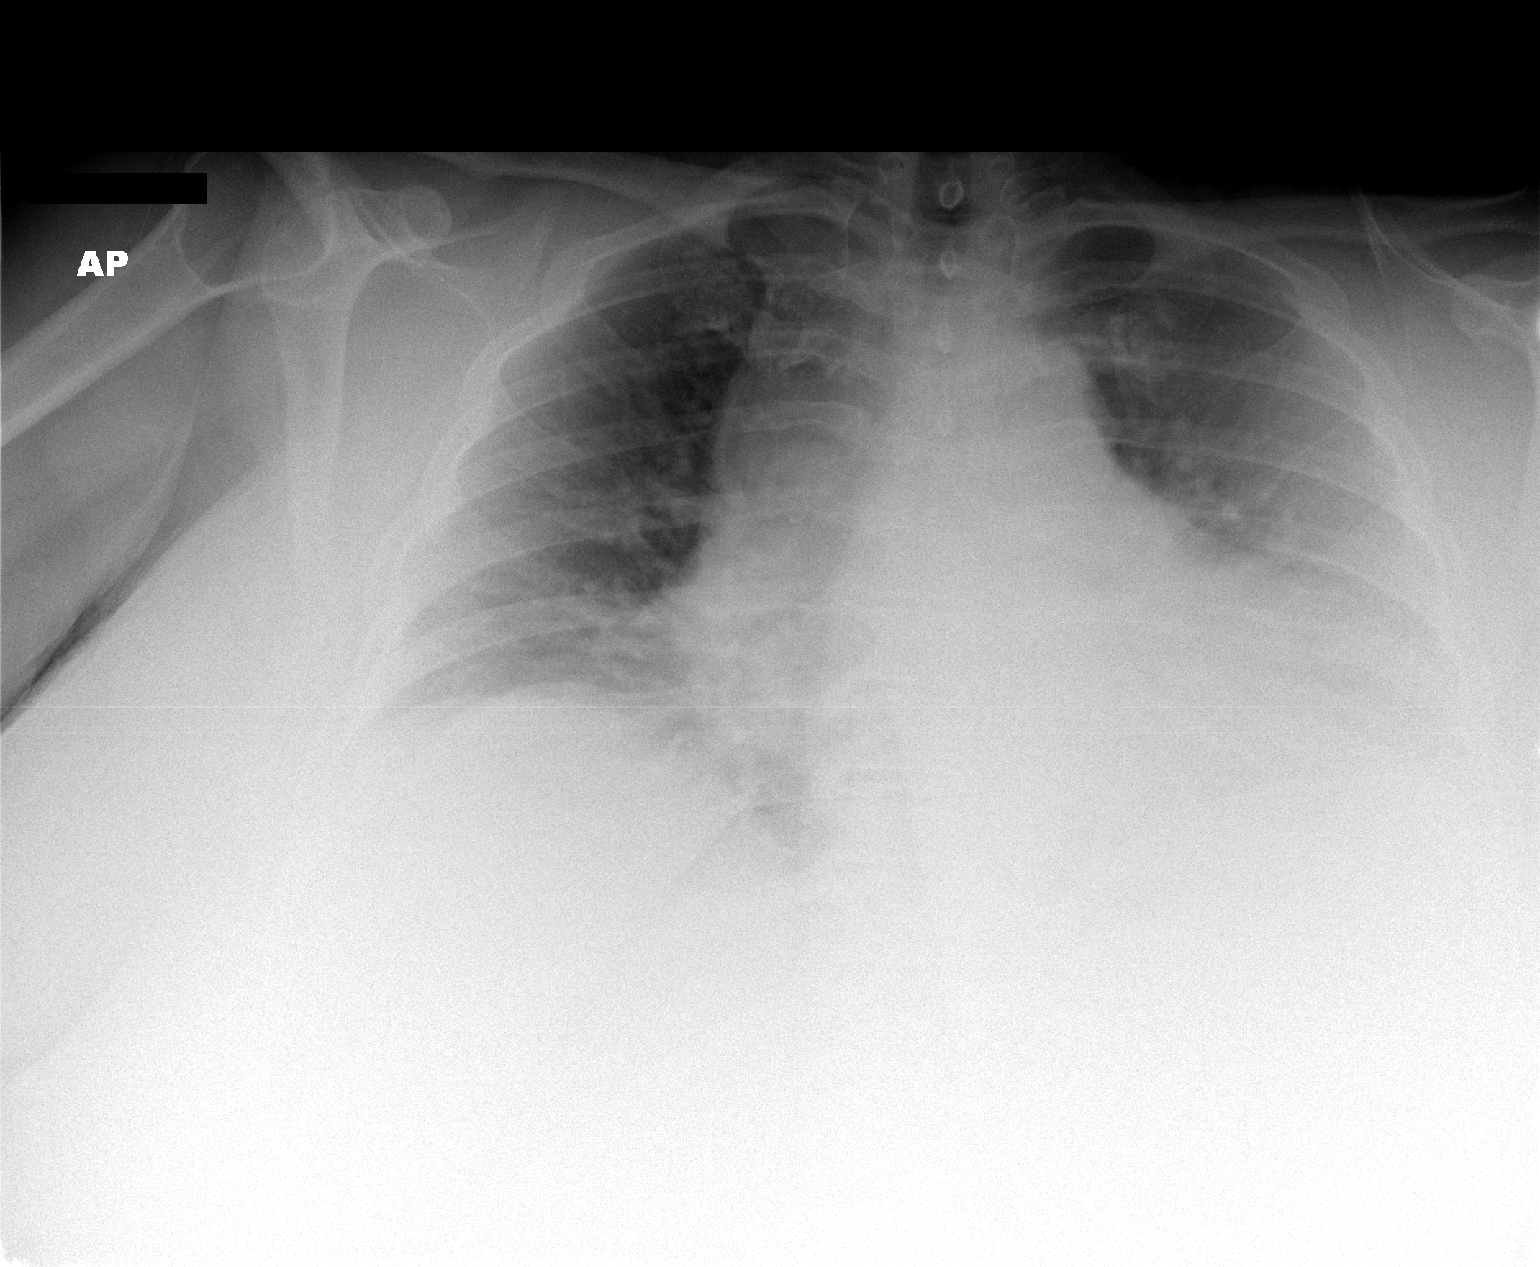

[2 of 2 positions shown; findings below may reference images not displayed]

FINDINGS: Trachea is midline.  Heart size stable.  Lungs are low in
volume with bibasilar atelectasis.
IMPRESSION: Low lung volumes with bibasilar atelectasis.

## 2010-03-20 IMAGING — CR DG CHEST 2V
2 series · 2 of 2 positions shown · non-contrast
Comparison: [DATE]/7777 [id number] hours

CLINICAL DATA: Chest pain with shortness of breath

CHEST - 2 VIEW

[w chest pa *]
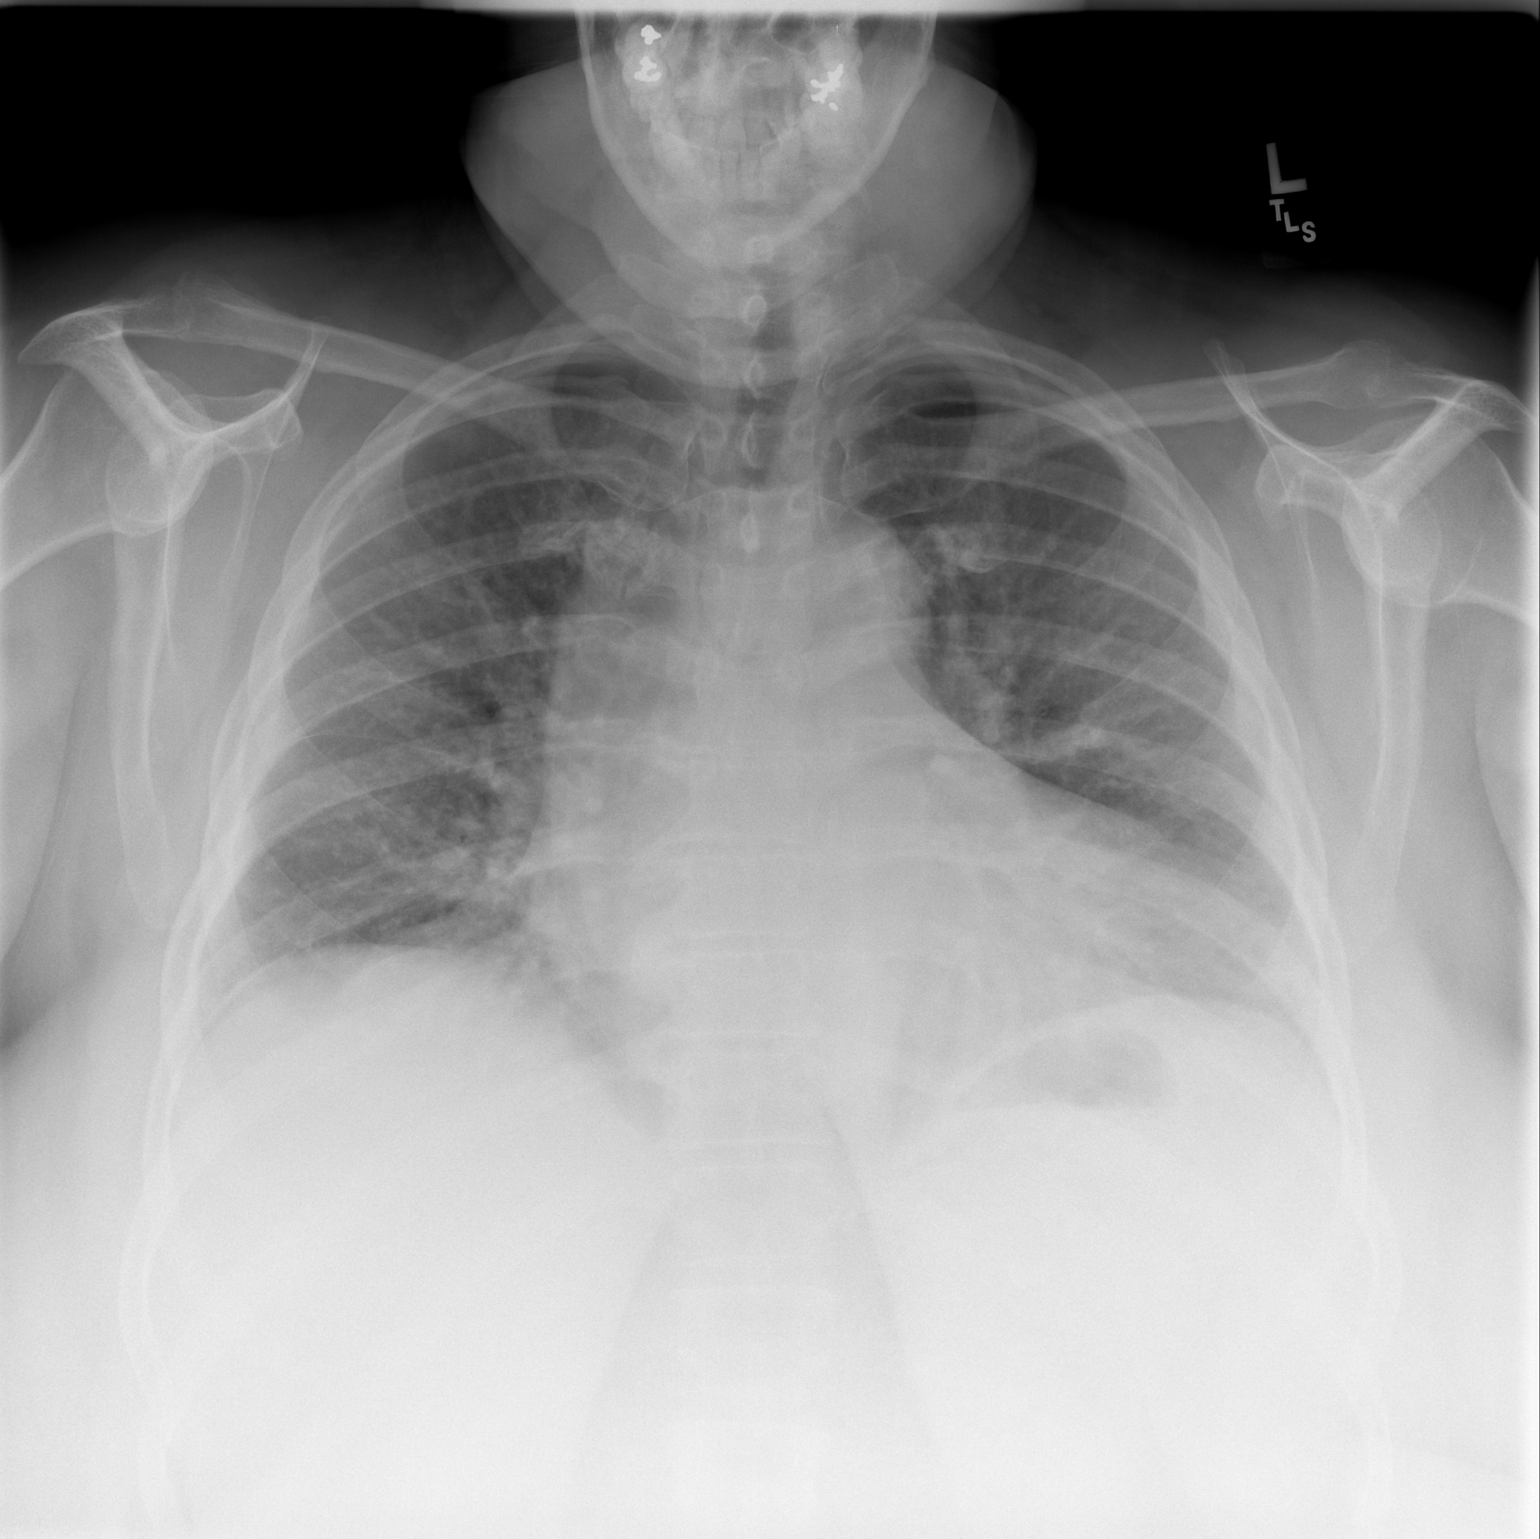

[w chest lat *]
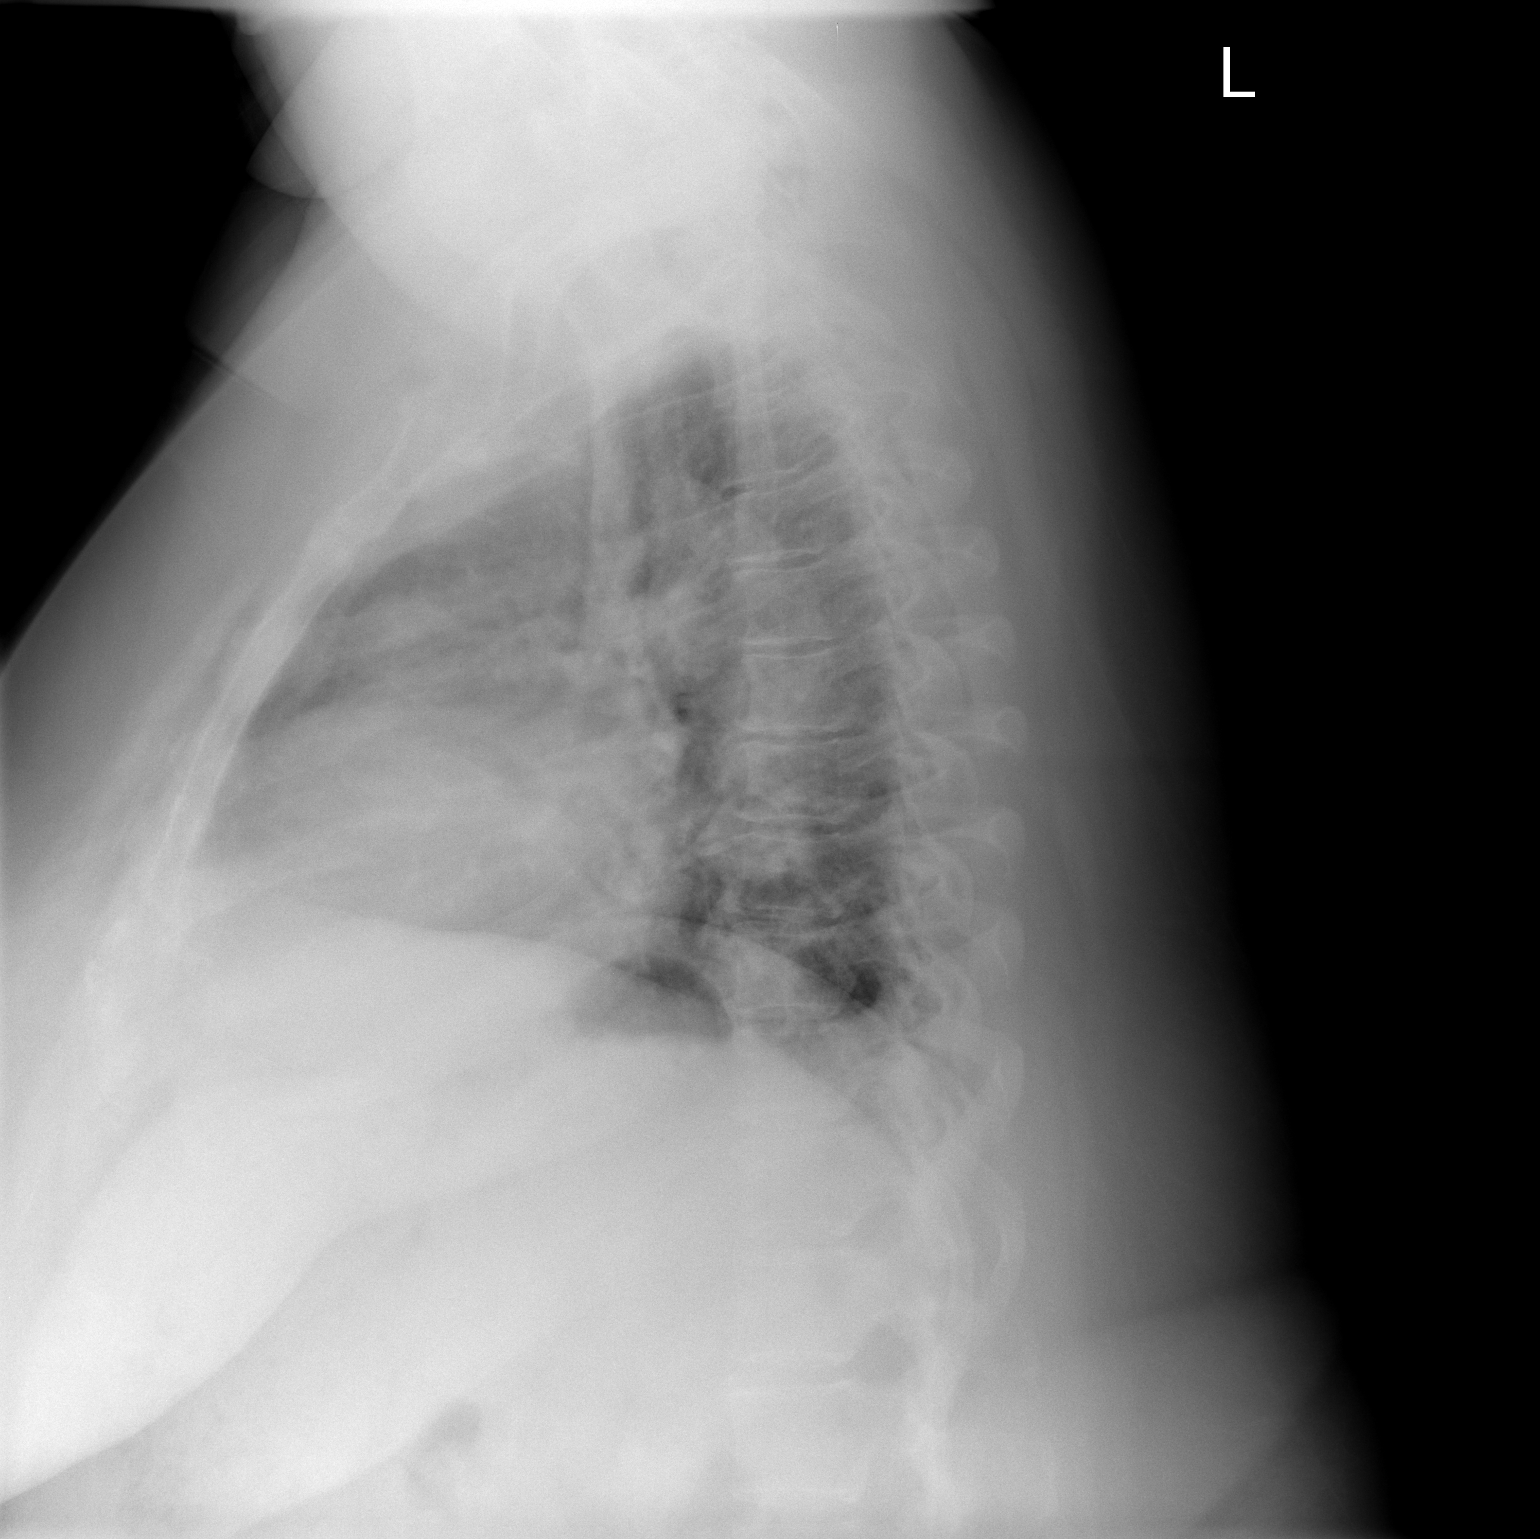

[2 of 2 positions shown; findings below may reference images not displayed]

FINDINGS: Cardiac enlargement with left ventricular hypertrophic
changes.  Vascular congestion without frank edema.  Peribronchial
cuffing.
IMPRESSION: Cardiac enlargement with vascular congestion. Low respiratory phase
film.

## 2010-03-30 ENCOUNTER — Emergency Department (HOSPITAL_COMMUNITY)
Admission: EM | Admit: 2010-03-30 | Discharge: 2010-03-30 | Payer: Self-pay | Source: Home / Self Care | Admitting: Emergency Medicine

## 2010-03-31 ENCOUNTER — Emergency Department (HOSPITAL_COMMUNITY)
Admission: EM | Admit: 2010-03-31 | Discharge: 2010-03-31 | Payer: Self-pay | Source: Home / Self Care | Admitting: Emergency Medicine

## 2010-04-04 IMAGING — CR DG CHEST 2V
2 series · 2 of 2 positions shown · non-contrast
Comparison: Two-view chest x-rays 09/01/2007 and 06/05/2007.

CLINICAL DATA: Cough, chest pain, chest congestion, shortness of
breath.

CHEST - 2 VIEW 09/16/2007:

[w chest pa *]
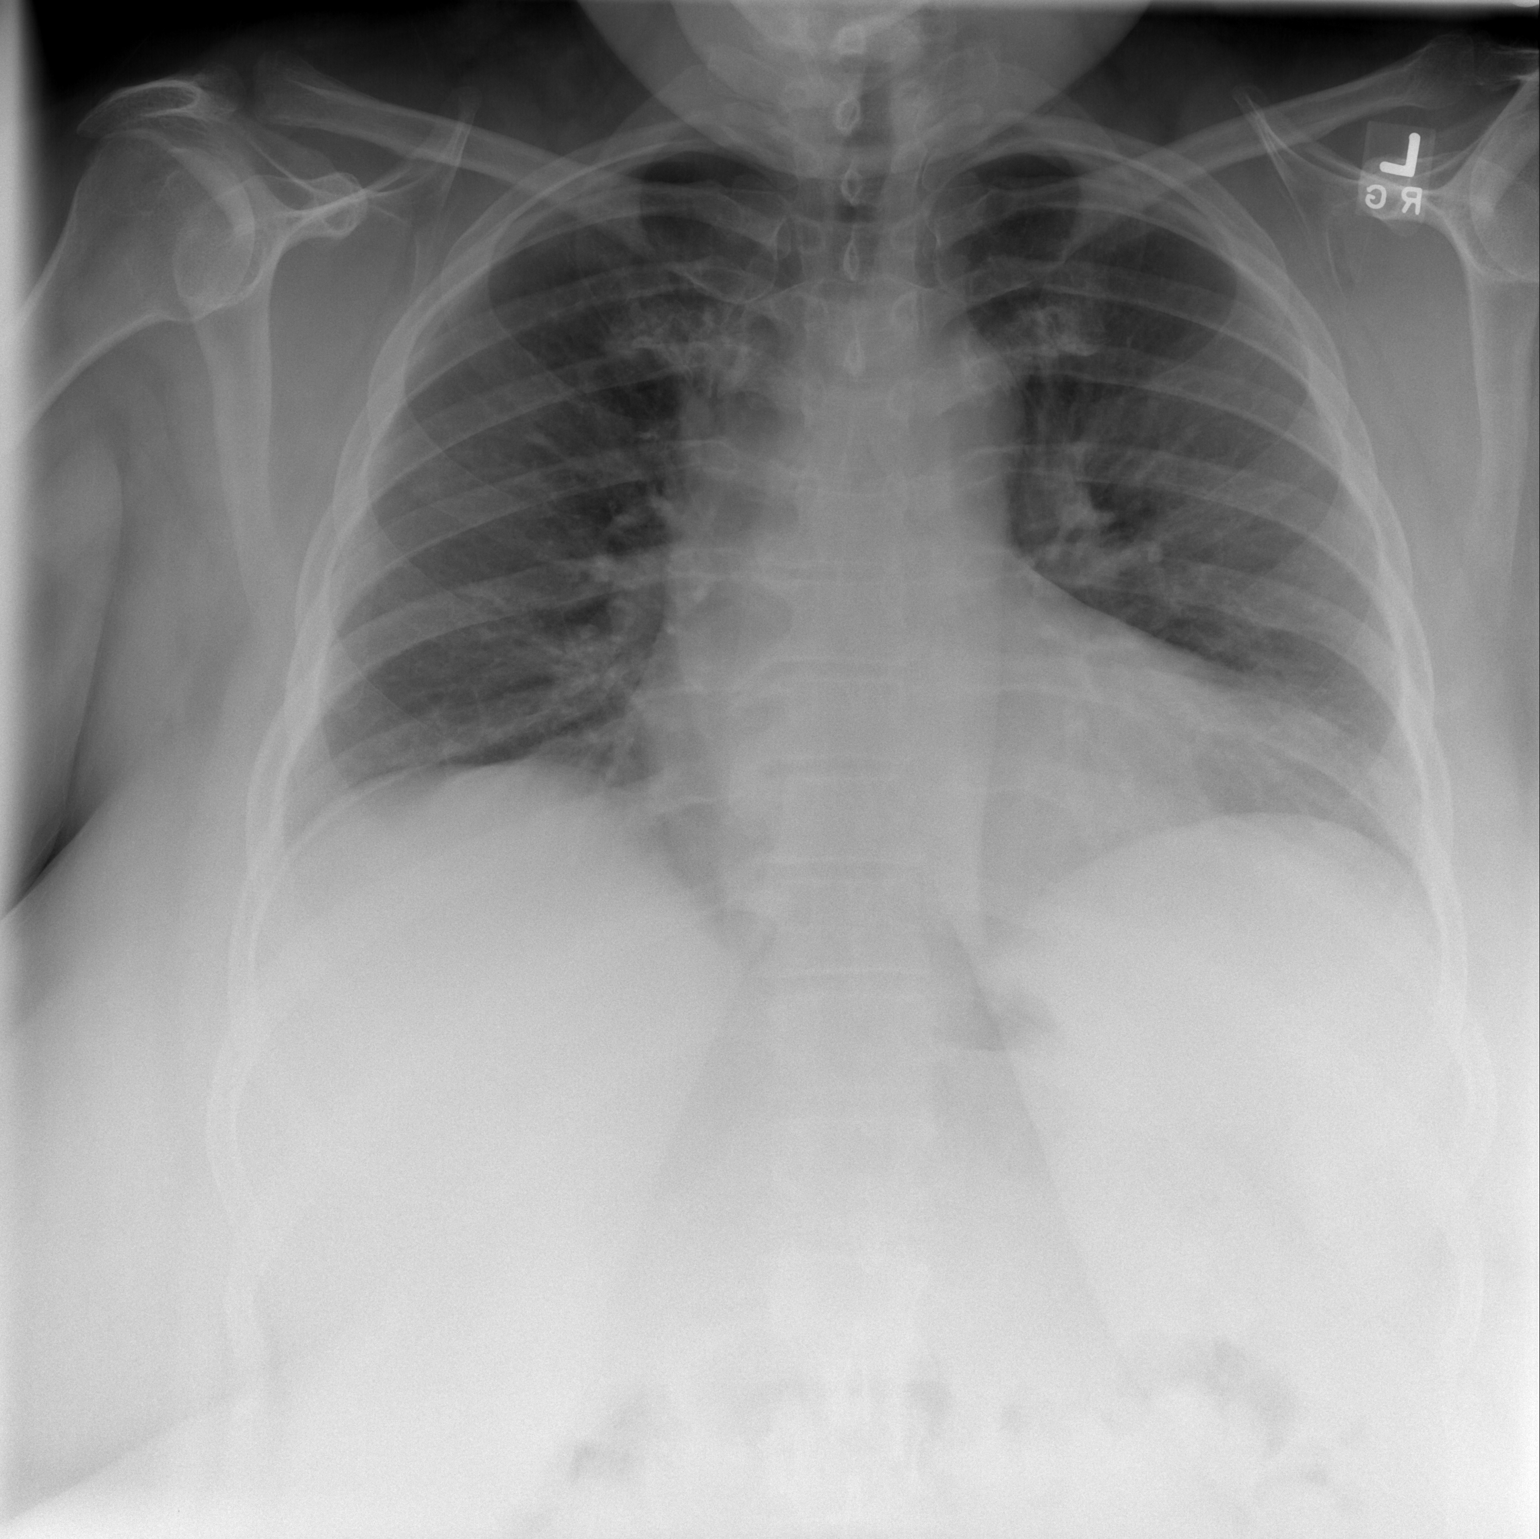

[w chest lat *]
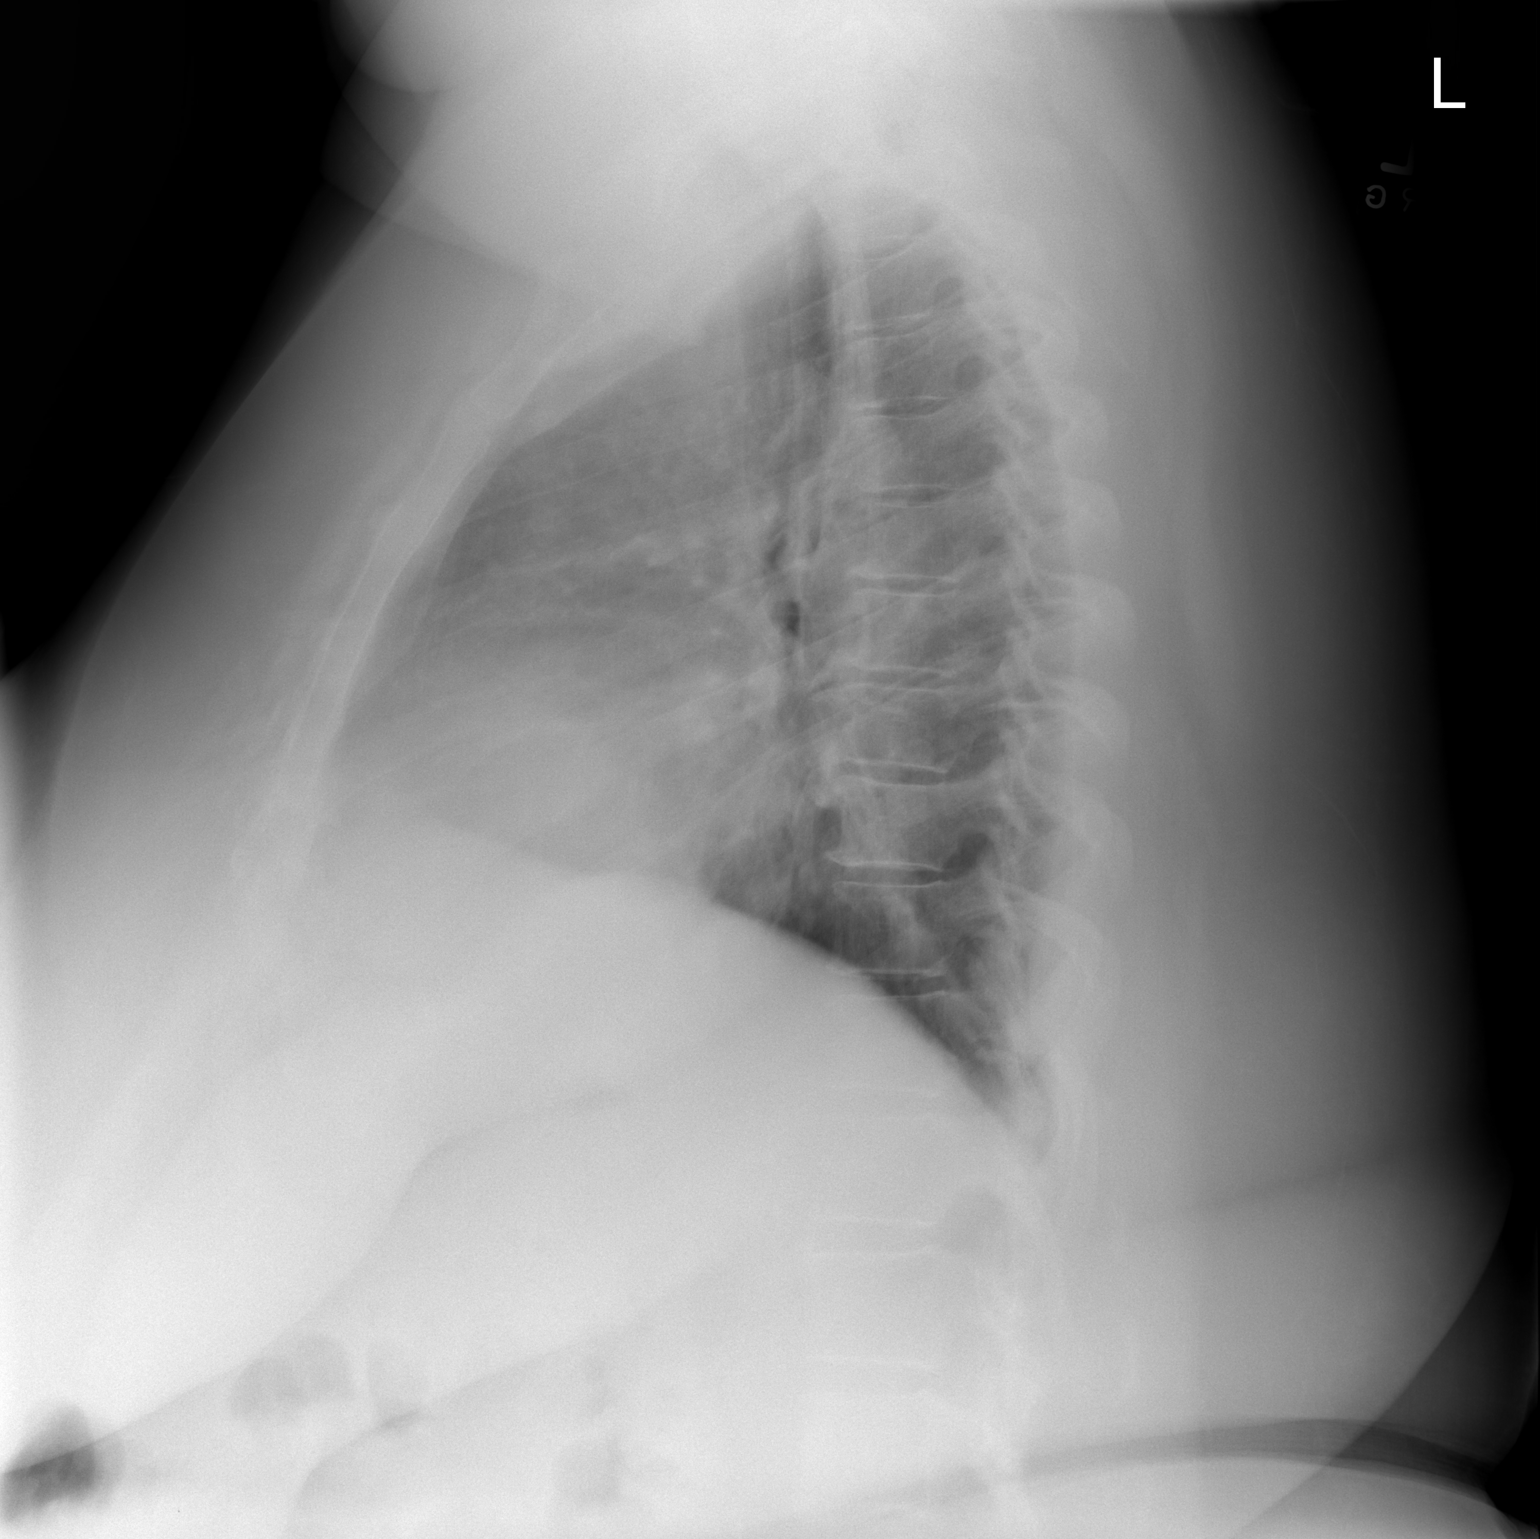

[2 of 2 positions shown; findings below may reference images not displayed]

FINDINGS: Suboptimal inspiration due to body habitus accounts for
the crowded bronchovascular markings at the bases and accentuates
the heart size.  Despite this, heart mildly enlarged.  Lungs clear.
No pleural effusions.  Degenerative changes throughout the thoracic
spine.  No significant interval change.
IMPRESSION: Stable mild cardiomegaly.  Suboptimal inspiration.  No acute
cardiopulmonary disease.

## 2010-04-10 ENCOUNTER — Ambulatory Visit
Admission: RE | Admit: 2010-04-10 | Discharge: 2010-04-10 | Payer: Self-pay | Source: Home / Self Care | Attending: Internal Medicine | Admitting: Internal Medicine

## 2010-04-10 ENCOUNTER — Ambulatory Visit: Admit: 2010-04-10 | Payer: Self-pay

## 2010-04-10 ENCOUNTER — Emergency Department (HOSPITAL_COMMUNITY)
Admission: EM | Admit: 2010-04-10 | Discharge: 2010-04-10 | Payer: Self-pay | Source: Home / Self Care | Admitting: Emergency Medicine

## 2010-04-10 ENCOUNTER — Telehealth (INDEPENDENT_AMBULATORY_CARE_PROVIDER_SITE_OTHER): Payer: Self-pay | Admitting: *Deleted

## 2010-04-14 ENCOUNTER — Encounter: Payer: Self-pay | Admitting: Internal Medicine

## 2010-04-14 ENCOUNTER — Encounter: Payer: Self-pay | Admitting: *Deleted

## 2010-04-23 NOTE — Miscellaneous (Signed)
Summary: ED consult 10/18/09  ED physician called for pt evaluation. Patient had claimed that she was advised admission by some physician that she sees on [address]. I went and evaluated the patient. She complained of ongoing rash and itching with some pus exudation. Area involved reportedly was left forearm, face, both breast and both thighs. However, on exam only mildly inflammed part was her left forearm with some warmth but no tenderness. She kept picking on it while I was examining her. 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 She also claimed eye involvement but on exam they appeared perfectly fine. Her labs also were normal including white count and creatinine. She claimed that she has renal failure but I did not find any evidence of it. At conclusion, I informed her that I do not see any reason to admit her. At this point, she got upset, asked for other physician and kept saying "you dont understand".  She claimed that she needed iv antibiotics. And last time that is what she got. I informed her on this admision I do not see any evidence of significant cellulitis or systemic infection to warrant admission and IV antibiotics. ID physician also had the same opinion and only reason they called me was to confirm their assessment.  At this time, we will discharge her with oral clindamycin and close follow up in the clinic.  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    Prescriptions: CLINDAMYCIN HCL 150 MG CAPS (CLINDAMYCIN HCL) Take two tablets every six hours  #72 x 0   Entered and Authorized by:   Pershing Cox MD   Signed by:   Pershing Cox MD on 10/18/2009   Method used:   Electronically to        [facility] (retail)       [phone]       [place], [place]  [address]       Ph: [phone]       Fax: [phone]   RxIDFO:[id number] CLINDAMYCIN HCL 150 MG  CAPS (CLINDAMYCIN HCL) Take two tablets every six hours  #72 x 0   Entered and Authorized by:   Pershing Cox MD   Signed by:   Pershing Cox MD on 10/18/2009   Method used:   Faxed to ...       [facility] (retail)       [address]       [place], [place]  [address]       Ph: [phone]       Fax: [phone]   RxID:   [id number]  Pt requested script to be sent to the [facility]. She no longer goes to [facility].   Appended Document: ED consult 10/18/09 ED physician agreed to the plan. I mistakenly typed ID physician instead.

## 2010-04-23 NOTE — Miscellaneous (Signed)
Summary: [facility] Discharge  Date of admission: 04/27/09  Date of discharge: 04/30/09  Brief reason for admission/active problems: Pt was admitted for the possibility of a superinfected rash.  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  She was given systemic steroids and put on Clindamycin.  Vital signs remained stable, rash improved slightly.  Followup needed: Please make sure the pt has taken her steroid and antibiotic course.  Please also re-refer the pt to see a Dermatologist again to get further input.  No labs necessarily need to be checked.  The medication and problem lists have been updated.  Please see the dictated discharge summary for details.    Prescriptions: HYDROXYZINE HCL 50 MG TABS (HYDROXYZINE HCL) Take 1 tab by mouth every 6 hrs as needed for itching  #60 x 0   Entered and Authorized by:   Doreene Burke MD   Signed by:   Doreene Burke MD on 04/30/2009   Method used:   Print then Give to Patient   RxID:   [id number] CLEOCIN 300 MG CAPS (CLINDAMYCIN HCL) Take 1 tablet by mouth three times a day for 5 days  #15 x 0   Entered and Authorized by:   Doreene Burke MD   Signed by:   Doreene Burke MD on 04/30/2009   Method used:   Print then Give to Patient   RxID:   [id number] PREDNISONE 10 MG TABS (PREDNISONE) Take 4 tabs by mouth daily for 2 days then decrease by 1 tab every 3 days until finished  #26 x 0   Entered and Authorized by:   Doreene Burke MD   Signed by:   Doreene Burke MD on 04/30/2009   Method used:   Print then Give to Patient   RxIDXJ:[id number]    Patient Instructions: 1)  Please come to your follow up appointment with Dr. Redmond Pulling on February 23rd at 1:30 pm. 2)  You will be taking prednisone in a tapering dose for the next 11 days. 3)  You will take 5 more days of clindamycin as well. 4)  When you come to the clinic we will get you set up with a Dermatologist again to get some more input about  your rash. 5)  You can take Hydroxyzine instead of benadryl if you prefer, but do not take both as they can both make you drowsy. 6)  Continue taking all of your other medicines as you have been.

## 2010-04-23 NOTE — Discharge Summary (Signed)
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 Patient received 2 dose of Solumedrol in the hospital and her rash completely resolved. Patient is to continue the current medications for her chronic rash and is to follow up with her dermatologist. Patient was also started on Tobramycin for possible infectious conjunctivitis and conjuctivitis improved dramatically over night. Patient is to continue the abx for 6 more days.  Medication list changes:  Removed medication of CLINDAMYCIN HCL 150 MG CAPS (CLINDAMYCIN HCL) Take two tablets every six hours Added new medication of TOBREX 0.3 % OINT (TOBRAMYCIN SULFATE) Apply thin ribbon to both eyes twice daily for 7 days. - Signed Rx of TOBREX 0.3 % OINT (TOBRAMYCIN SULFATE) Apply thin ribbon to both eyes twice daily for 7 days.;  #1 x 0;  Signed;  Entered by: Yolonda Kida MD;  Authorized by: Yolonda Kida MD;  Method used: Print then Give to Patient Rx of PROVENTIL HFA 108 (90 BASE) MCG/ACT  AERS (ALBUTEROL SULFATE) one puff every 4 hours as needed;  #1 x 11;  Signed;  Entered by: Yolonda Kida MD;  Authorized by: Yolonda Kida MD;  Method used: Print then Give to Patient Rx of CLONIDINE HCL 0.2 MG TABS (CLONIDINE HCL) Take one pill by mouth twice daily;  #60 x 11;  Signed;  Entered by: Yolonda Kida MD;  Authorized by: Yolonda Kida MD;  Method used: Print then Give to Patient Rx of BENADRYL 25 MG  CAPS (DIPHENHYDRAMINE HCL) Take one pill every 6 hours as needed for allergies.;  #60 x 11;  Signed;  Entered by: Yolonda Kida MD;  Authorized by: Yolonda Kida MD;  Method used: Print then Give to Patient Rx of ALBUTEROL SULFATE (2.5 MG/3ML) 0.083% NEBU (ALBUTEROL SULFATE) Use as required not more than  four times a day. Come to ED if persistent breathing problem.;  #1 x 11;  Signed;  Entered by: Yolonda Kida MD;  Authorized by: Yolonda Kida MD;  Method used: Print then Give to Patient Rx of TRIAMCINOLONE ACETONIDE 0.1 % OINT (TRIAMCINOLONE ACETONIDE) Place thin layer over rash twice a day;  #453.6 x 3;  Signed;  Entered by: Yolonda Kida MD;  Authorized by: Yolonda Kida MD;  Method used: Print then Give to Patient Rx of AMMONIUM LACTATE 12 % CREA (AMMONIUM LACTATE) Apply thin layer to affected area twice a day;  #140g x 11;  Signed;  Entered by: Yolonda Kida MD;  Authorized by: Yolonda Kida MD;  Method used: Print then Give to Patient Rx of ADVAIR DISKUS 100-50 MCG/DOSE MISC (FLUTICASONE-SALMETEROL) take 1 puff two times a day.;  #1 x 11;  Signed;  Entered by: Yolonda Kida MD;  Authorized by: Yolonda Kida MD;  Method used: Print then Give to Patient Rx of SALEX 6 % SHAM (SALICYLIC ACID) Apply to scalp 1-2 times per week;  #166ml x 3;  Signed;  Entered by: Yolonda Kida MD;  Authorized by: Yolonda Kida MD;  Method used: Print then Give to Patient Rx of LISINOPRIL 20 MG TABS (LISINOPRIL) Take 1 tablet by mouth once a day;  #30 x 11;  Signed;  Entered by: Yolonda Kida MD;  Authorized by: Yolonda Kida MD;  Method used: Print then Give to Patient Rx of EUCERIN  LOTN (EMOLLIENT)  apply twice daily on the affected areas.;  #1 x 11;  Signed;  Entered by: Yolonda Kida MD;  Authorized by: Yolonda Kida MD;  Method used: Print then Give to Patient Rx of PROTONIX 40 MG TBEC (PANTOPRAZOLE SODIUM) Take 1 tablet by mouth once a day;  #30 x 11;  Signed;  Entered by: Yolonda Kida MD;  Authorized by: Yolonda Kida MD;  Method used: Print then Give to Patient  The medication, problem, and allergy lists have been updated.  Please see the dictated discharge summary for details.  Discharge medications:  PROVENTIL HFA 108 (90 BASE) MCG/ACT  AERS (ALBUTEROL SULFATE) one puff every 4 hours as needed CLONIDINE HCL  0.2 MG TABS (CLONIDINE HCL) Take one pill by mouth twice daily BENADRYL 25 MG  CAPS (DIPHENHYDRAMINE HCL) Take one pill every 6 hours as needed for allergies. NEBULIZER  MISC (NEBULIZERS) Use as required for your asthma, up to four times a day. Please come to ED if persistent breathing problem. ALBUTEROL SULFATE (2.5 MG/3ML) 0.083% NEBU (ALBUTEROL SULFATE) Use as required not more than four times a day. Come to ED if persistent breathing problem. PERCOCET 5-325 MG TABS (OXYCODONE-ACETAMINOPHEN) take one tab by mouth once every 6 hours as needed for pain TRIAMCINOLONE ACETONIDE 0.1 % OINT (TRIAMCINOLONE ACETONIDE) Place thin layer over rash twice a day AMMONIUM LACTATE 12 % CREA (AMMONIUM LACTATE) Apply thin layer to affected area twice a day ADVAIR DISKUS 100-50 MCG/DOSE MISC (FLUTICASONE-SALMETEROL) take 1 puff two times a day. SALEX 6 % SHAM (SALICYLIC ACID) Apply to scalp 1-2 times per week LISINOPRIL 20 MG TABS (LISINOPRIL) Take 1 tablet by mouth once a day EUCERIN  LOTN (EMOLLIENT) apply twice daily on the affected areas. AMBIEN 5 MG TABS (ZOLPIDEM TARTRATE) Take 1 tab by mouth at bedtime PROTONIX 40 MG TBEC (PANTOPRAZOLE SODIUM) Take 1 tablet by mouth once a day TOBREX 0.3 % OINT (TOBRAMYCIN SULFATE) Apply thin ribbon to both eyes twice daily for 7 days.  Other patient instructions:  Follow up with your Dermatologist on August 8th as scheduled. We will call you with an appointment in the Outpatient clinic.  Note: Hospital Discharge Medications & Other Instructions handout was printed, one copy for patient and a second copy to be placed in hospital chart.  Prescriptions: PROTONIX 40 MG TBEC (PANTOPRAZOLE SODIUM) Take 1 tablet by mouth once a day  #30 x 11   Entered and Authorized by:   Yolonda Kida MD   Signed by:   Yolonda Kida MD on 10/20/2009   Method used:   Print then Give to Patient   RxID:   [id number] EUCERIN  LOTN (EMOLLIENT) apply twice daily on the affected areas.   #1 x 11   Entered and Authorized by:   Yolonda Kida MD   Signed by:   Yolonda Kida MD on 10/20/2009   Method used:   Print then Give to Patient   [id number] LISINOPRIL 20 MG TABS (LISINOPRIL) Take 1 tablet by mouth once a day  #30 x 11   Entered and Authorized by:   Yolonda Kida MD   Signed by:   Yolonda Kida MD on 10/20/2009   Method used:   Print then Give to Patient   RxID:   [id number] SALEX 6 % SHAM (SALICYLIC ACID) Apply to scalp 1-2 times per week  #161ml x 3   Entered and Authorized by:   Yolonda Kida MD   Signed by:   Yolonda Kida MD on 10/20/2009  Method used:   Print then Give to Patient   RxID:   [id number] ADVAIR DISKUS 100-50 MCG/DOSE MISC (FLUTICASONE-SALMETEROL) take 1 puff two times a day.  #1 x 11   Entered and Authorized by:   Yolonda Kida MD   Signed by:   Yolonda Kida MD on 10/20/2009   Method used:   Print then Give to Patient   [id number] AMMONIUM LACTATE 12 % CREA (AMMONIUM LACTATE) Apply thin layer to affected area twice a day  #140g x 11   Entered and Authorized by:   Yolonda Kida MD   Signed by:   Yolonda Kida MD on 10/20/2009   Method used:   Print then Give to Patient   [id number] TRIAMCINOLONE ACETONIDE 0.1 % OINT (TRIAMCINOLONE ACETONIDE) Place thin layer over rash twice a day  #453.6 x 3   Entered and Authorized by:   Yolonda Kida MD   Signed by:   Yolonda Kida MD on 10/20/2009   Method used:   Print then Give to Patient   [id number] ALBUTEROL SULFATE (2.5 MG/3ML) 0.083% NEBU (ALBUTEROL SULFATE) Use as required not more than four times a day. Come to ED if persistent breathing problem.  #1 x 11   Entered and Authorized by:   Yolonda Kida MD   Signed by:   Yolonda Kida MD on 10/20/2009   Method used:   Print then Give to Patient   RxID:   [id number] BENADRYL 25 MG  CAPS (DIPHENHYDRAMINE HCL) Take one pill every 6 hours as needed for allergies.  #60 x 11   Entered  and Authorized by:   Yolonda Kida MD   Signed by:   Yolonda Kida MD on 10/20/2009   Method used:   Print then Give to Patient   [id number] CLONIDINE HCL 0.2 MG TABS (CLONIDINE HCL) Take one pill by mouth twice daily  #60 x 11   Entered and Authorized by:   Yolonda Kida MD   Signed by:   Yolonda Kida MD on 10/20/2009   Method used:   Print then Give to Patient   RxID:   [id number] PROVENTIL HFA 108 (90 BASE) MCG/ACT  AERS (ALBUTEROL SULFATE) one puff every 4 hours as needed  #1 x 11   Entered and Authorized by:   Yolonda Kida MD   Signed by:   Yolonda Kida MD on 10/20/2009   Method used:   Print then Give to Patient   RxID:   [id number] TOBREX 0.3 % OINT (TOBRAMYCIN SULFATE) Apply thin ribbon to both eyes twice daily for 7 days.  #1 x 0   Entered and Authorized by:   Yolonda Kida MD   Signed by:   Yolonda Kida MD on 10/20/2009   Method used:   Print then Give to Patient   RxID:   [id number]
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
## 2010-04-23 NOTE — Consult Note (Signed)
Summary: Wende Bushy MD  Wende Bushy MD   Imported By: Lacy Duverney 10/26/2009 15:30:14  _____________________________________________________________________  External Attachment:    Type:   Image     Comment:   External Document

## 2010-04-23 NOTE — Miscellaneous (Signed)
Summary: [facility]:CMN  [facility]:CMN   Imported By: Bonner Puna 05/29/2009 15:04:56  _____________________________________________________________________  External Attachment:    Type:   Image     Comment:   External Document
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
## 2010-04-23 NOTE — Miscellaneous (Signed)
  Clinical Lists Changes I was called by ED physician to help evaluate Kara Vincent. She presents to ED department complaining of worsening rash. On physical exam she has diffuse macular rash, with redness, but no sing of infection. no drainage. Her blood pressure is stable, WBC is at baseline. The paln is to discharge her on  Augmentin and oitment cream as recomended in the past by her dermatologist. Patient  was advised to call the clinic for an appointment for today Friday. We will call her also.

## 2010-04-23 NOTE — Progress Notes (Signed)
Summary: refill/ hla  Phone Note Refill Request Message from:  Patient on May 02, 2009 2:18 PM  Refills Requested: Medication #1:  PERCOCET 5-325 MG TABS take one tab by mouth once every 6 hours as needed for pain Initial call taken by: Freddy Finner RN,  May 02, 2009 2:19 PM  Follow-up for Phone Call        Rx ready to be picked up; pt. was called. Follow-up by: Morrison Old RN,  May 09, 2009 9:42 AM  Additional Follow-up for Phone Call Additional follow up Details #1::        Pt. wascalledagain;she will pick it up Monday. Additional Follow-up by: Morrison Old RN,  May 11, 2009 3:56 PM    Prescriptions: PERCOCET 5-325 MG TABS (OXYCODONE-ACETAMINOPHEN) take one tab by mouth once every 6 hours as needed for pain  #60 x 0   Entered and Authorized by:   Junius Finner  MD   Signed by:   Junius Finner  MD on 05/09/2009   Method used:   Print then Give to Patient   RxID:   [id number]

## 2010-04-23 NOTE — Assessment & Plan Note (Signed)
Summary: f/u ED, short of breath, lesions on head, gen body pain/pcp-p...   Vital Signs:  Patient profile:   45 year old female Height:      65 inches (165.10 cm) Weight:      398.2 pounds (181.00 kg) BMI:     66.50 Temp:     98.2 degrees F (36.78 degrees C) oral Pulse rate:   80 / minute BP sitting:   115 / 79  (right arm) Cuff size:   thigh  Vitals Entered By: Mateo Flow Deborra Medina) (December 05, 2009 11:02 AM)  CC: ED f/u, pt c/o "knots" on head x 2-3 weeks and body rash Is Patient Diabetic? No Pain Assessment Patient in pain? yes     Location: head "knot" Intensity: head Type: sharp Onset of pain  constant x 2-3 weeks Nutritional Status BMI of > 30 = obese  Have you ever been in a relationship where you felt threatened, hurt or afraid?No   Does patient need assistance? Functional Status Self care Ambulation Impaired:Risk for fall Comments cane Pt aware of appt with Amber at Dr [place] Crane 12/06/09 12:45PM per Dr Nance Pew and 2 appts in [facility] clinic one with Dr Jerelene Redden 12/14/09 1:30PM and also 02/2010 with Dr Obie Dredge per Dr Nance Pew. Office notes and ER viist faxed to Dr Juel Burrow office. Hilda Blades Ditzler RN   Primary Care Provider:  Trish Fountain MD  CC:  ED f/u and pt c/o "knots" on head x 2-3 weeks and body rash.  History of Present Illness: This is 45 year old female with multiple PMH including Asthma, back pain, GERD, Headache, HTN  and Darier's Disease who presents for a follow up after an ED visit on 12/05/09 for hospital admission  for further therapy for rash and abcess on forhead  with  IV steroids.    Patient was seen in the ED for a rash which started 2 days ago and now spreading over the face and scalp. The rash is painfull and pruritic. Patient noted that it was not aggreviated by anything and it is not relieved by anything except IV steroids . She further noted that she has urinary urgency,  back pain and leg swelling which will improve with IV lasix.  Patient was  discharge from the ED with doxycyline for 7 dyas for the abscess on the forhead and percocet for pain. Patient furthermore was asked to use a warm moist compress on the sore area on the right forehead 4-5 times a day to help it drain and heal. She was advised to follow up in 2 days for a general chek up and recheck of the abcess with PCP.   Note: Patient had multiple ED visits in the past last on 11/03/2009 for rash. Patient was admitted end of July for possible red man syndrom after receiving Vancomycin and pt was started at that time on IV steroids. Rash improved during hospital course.         Preventive Screening-Counseling & Management  Alcohol-Tobacco     Alcohol drinks/day: 0     Smoking Status: quit     Year Quit: 1980's     Passive Smoke Exposure: no  Current Medications (verified): 1)  Proventil Hfa 108 (90 Base) Mcg/act  Aers (Albuterol Sulfate) .... One Puff Every 4 Hours As Needed 2)  Clonidine Hcl 0.2 Mg Tabs (Clonidine Hcl) .... Take One Pill By Mouth Twice Daily 3)  Benadryl 25 Mg  Caps (Diphenhydramine Hcl) .... Take One Pill Every 6 Hours As Needed For  Allergies. 4)  Nebulizer  Misc (Nebulizers) .... Use As Required For Your Asthma, Up To Four Times A Day. Please Come To Ed If Persistent Breathing Problem. 5)  Albuterol Sulfate (2.5 Mg/33ml) 0.083% Nebu (Albuterol Sulfate) .... Use As Required Not More Than Four Times A Day. Come To Ed If Persistent Breathing Problem. 6)  Percocet 5-325 Mg Tabs (Oxycodone-Acetaminophen) .... Take One Tab By Mouth Once Every 6 Hours As Needed For Pain 7)  Triamcinolone Acetonide 0.1 % Oint (Triamcinolone Acetonide) .... Place Thin Layer Over Rash Twice A Day 8)  Ammonium Lactate 12 % Crea (Ammonium Lactate) .... Apply Thin Layer To Affected Area Twice A Day 9)  Advair Diskus 100-50 Mcg/dose Misc (Fluticasone-Salmeterol) .... Take 1 Puff Two Times A Day. 10)  Salex 6 % Sham (Salicylic Acid) .... Apply To Scalp 1-2 Times Per Week 11)   Lisinopril 20 Mg Tabs (Lisinopril) .... Take 1 Tablet By Mouth Once A Day 12)  Eucerin  Lotn (Emollient) .... Apply Twice Daily On The Affected Areas. 13)  Ambien 5 Mg Tabs (Zolpidem Tartrate) .... Take 1 Tab By Mouth At Bedtime 14)  Protonix 40 Mg Tbec (Pantoprazole Sodium) .... Take 1 Tablet By Mouth Once A Day 15)  Tobrex 0.3 % Oint (Tobramycin Sulfate) .... Apply Thin Ribbon To Both Eyes Twice Daily For 7 Days. 16)  Trazodone Hcl 50 Mg Tabs (Trazodone Hcl) .... Take 1 Tablet By Mouth At Bedtime 17)  Diphenhydramine Hcl 25 Mg Caps (Diphenhydramine Hcl) .... Take One Tablet By Mouth Q6hrs As Needed For Allergies 18)  Alprazolam 1 Mg Tabs (Alprazolam) .... Take One Tablet By Mouth Twice A Day As Needed For Anxiety 19)  Amitriptyline Hcl 75 Mg Tabs (Amitriptyline Hcl) .... Take 1 Tablet By Mouth At Bedtime  Allergies: 1)  ! Zithromax (Azithromycin) 2)  ! * Cats 3)  ! * Bactrim Ds 4)  Ibuprofen  Review of Systems       As per HPI.  Physical Exam  General:  alert, overweight-appearing, and uncomfortable-appearing. Mouth:  Oral mucosa and oropharynx without lesions or exudates.   Neck:  No deformities, masses, or tenderness noted. Lungs:  Normal respiratory effort, chest expands symmetrically. Lungs are clear to auscultation, no crackles or wheezes. Heart:  Normal rate and regular rhythm. S1 and S2 normal without gallop, murmur, click, rub or other extra sounds. Abdomen:  Bowel sounds positive,abdomen distended but  non-tender without masses, organomegaly or hernias noted. Pulses:  R and L dorsalis pedis and posterior tibial pulses are full and equal bilaterally Extremities:  No clubbing, cyanosis, edema, or deformity noted with normal full range of motion of all joints.   Skin:  On scalp, forhead, nasolabial folds skin colored papules with a greasy, warty texture. On forhead draining 2 cm abscess with surrounding swelling and erythema.  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 Patient was afebril at presentation in the clinic and in ED. Patient was recommended to continue on medical therapy prescribed by the ED including Doxycycline and Percocet for pain and was advised to follow up with Dermatologist for further management.   After long discussion with Dr Nance Pew for almost 2 hours with the patient her wish to get admitted for IV steroids could not be accomplished since there was no  medical evidence for admission.  Considering the presentation and physical exam treatment with  doxycycline was considered sufficiant at this point for the abcess on the forhead. Patient was advised again  to use a warm moist compress on the sore area on the right forehead 4-5 times a day to help it drain and heal. She was advised to follow up in 1week for a  recheck of the abcess with PCP.   Note: Darier's disease is a keratosis Follicularis with greasy Hyperkeratotic papules (autosomonal dominat inherited).  It is a chronic unremitting condition. Main stay in therapy is sunscreen, moisturizer with urea or lactic acid, topical steroids for inflammation. Case report have shown that topical retionoids can reduce Hyperkeratosis. Patient should follow up with Dermatologist for further management.    Problem # 2:  URGE INCONTINENCE (U8551146.31) Less likely du to UTI since urinalysis was negative for Nitrates and Leucocytes. Patient has hx of urge incontinence in the past and was prescribed oxybutynin(2009) which she is currently not taking.   Patient was recommended to follow up with PCP for possible referral to Urology.  Orders: T-Urinalysis Dipstick only [id number])  Complete Medication List: 1)  Proventil Hfa 108 (90 Base) Mcg/act Aers (Albuterol sulfate) .... One puff every 4 hours as needed 2)  Clonidine Hcl 0.2 Mg Tabs (Clonidine  hcl) .... Take one pill by mouth twice daily 3)  Benadryl 25 Mg Caps (Diphenhydramine hcl) .... Take one pill every 6 hours as needed for allergies. 4)  Nebulizer Misc (Nebulizers) .... Use as required for your asthma, up to four times a day. please come to ed if persistent breathing problem. 5)  Albuterol Sulfate (2.5 Mg/25ml) 0.083% Nebu (Albuterol sulfate) .... Use as required not more than four times a day. come to ed if persistent breathing problem. 6)  Percocet 5-325 Mg Tabs (Oxycodone-acetaminophen) .... Take one tab by mouth once every 6 hours as needed for pain 7)  Triamcinolone Acetonide 0.1 % Oint (Triamcinolone acetonide) .... Place thin layer over rash twice a day 8)  Ammonium Lactate 12 % Crea (Ammonium lactate) .... Apply thin layer to affected area twice a day 9)  Advair Diskus 100-50 Mcg/dose Misc (Fluticasone-salmeterol) .... Take 1 puff two times a day. 10)  Salex 6 % Sham (Salicylic acid) .... Apply to scalp 1-2 times per week 11)  Lisinopril 20 Mg Tabs (Lisinopril) .... Take 1 tablet by mouth once a day 12)  Eucerin Lotn (Emollient) .... Apply twice daily on the affected areas. 13)  Ambien 5 Mg Tabs (Zolpidem tartrate) .... Take 1 tab by mouth at bedtime 14)  Protonix 40 Mg Tbec (Pantoprazole sodium) .... Take 1 tablet by mouth once a day 15)  Tobrex 0.3 % Oint (Tobramycin sulfate) .... Apply thin ribbon to both eyes twice daily for 7 days. 16)  Trazodone Hcl 50 Mg Tabs (Trazodone hcl) .... Take 1 tablet by mouth at bedtime 17)  Alprazolam 1 Mg Tabs (Alprazolam) .... Take one tablet by mouth twice a day as needed for anxiety 18)  Amitriptyline Hcl 75 Mg Tabs (Amitriptyline hcl) .... Take 1 tablet by mouth at bedtime  Patient Instructions: 1)  Please follow up with Dermatologist as soon as possible.    Laboratory Results   Urine Tests  Date/Time Received: .Mateo Flow [facility])  December 05, 2009 12:23 PM  Date/Time Reported: .Mateo Flow [facility])  December 05, 2009 12:23 PM   Routine Urinalysis   Color: brown Appearance: Clear Glucose: negative   (Normal Range: Negative) Bilirubin: small   (Normal Range: Negative) Ketone: negative   (Normal Range: Negative) Spec.  Gravity: >=1.030   (Normal Range: 1.003-1.035) Blood: negative   (Normal Range: Negative) pH: 5.0   (Normal Range: 5.0-8.0) Protein: 30   (Normal Range: Negative) Urobilinogen: 0.2   (Normal Range: 0-1) Nitrite: negative   (Normal Range: Negative) Leukocyte Esterace: negative   (Normal Range: Negative)

## 2010-04-23 NOTE — Letter (Signed)
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

## 2010-04-23 NOTE — Progress Notes (Signed)
Summary: visit/ hla  Phone Note Call from Patient   Summary of Call: pt presents c/o wanting to be admitted. c/o lesions to forehead, shortness of breath, generalized pain. was seen in ED last night. ED visit printed and given to resident  Initial call taken by: Freddy Finner RN,  December 05, 2009 11:28 AM

## 2010-04-23 NOTE — Progress Notes (Signed)
Summary: rash/ hla  Phone Note Call from Patient   Summary of Call: pt calls to ask "can you catch cellulitis", when ask to explain she states that she just recently noticed that her L side of abd/ flank area is red, swollen and painful. cannot tell me when this happened. answers "i don't know" to almost every ?, appt is set for mon. Initial call taken by: Freddy Finner RN,  Jul 27, 2009 5:10 PM
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
## 2010-04-23 NOTE — Medication Information (Signed)
Summary: Visual merchandiser   Imported By: Bonner Puna 05/16/2009 16:05:50  _____________________________________________________________________  External Attachment:    Type:   Image     Comment:   External Document

## 2010-04-23 NOTE — Progress Notes (Signed)
Summary: Rash/ B/P  Phone Note Call from Patient   Caller: Patient Call For: Trish Fountain MD Summary of Call: Symptoms: Headaches, Rash Characteristics: Dizzy, rash Location: Head, legs Onset: 2 days Aggravating Factors: Took B/P herself was 156/136 Relieving Factors: History: Hypertension, Rash Level of Urgency: Nursing Diagnosis/Chief Complaint: Rash on legs Suggested Follow Up To Physician:  Pt sent to the ER to assess B/P Patient Education: Contingency Plan: Go to the ER for assessment of symptoms.  Follow up appointment in the clinics as needed. Follow Up Response: Pt agreed to go to the ER. Wanted to get an appointment in the Clinics to be admitted for the rash.  Pt was advised to go to the ED for assessment of her B/P as soon as possible..sign Initial call taken by: Sander Nephew RN,  October 04, 2009 5:08 PM  Follow-up for Phone Call        agree with excellent plan Follow-up by: Rhea Pink  DO,  October 04, 2009 5:17 PM
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
## 2010-04-25 NOTE — Progress Notes (Signed)
Summary: Late and behavioral discussion  Phone Note Other Incoming   Caller: Patient late for visit Details for Reason: Behavior Summary of Call: Administrative note Re: Behavior I spoke with the patient while closed for lunch (I asked the waiting room be locked).  She had an 1100 appointment and came in around 1200.  She claims a rash.  She was told by attending she would be seen at 1400 (worked in) so as not to go to ED.  My discussion with the patient included her repetitive lateness (bus no longer counts, leave earlier), her comments with registration, bringing other patient(s) into the discussion, demanding she be seen, doesn't have (provided) the clinic phone number to call.  Generally my experience is evry time she is seen in [facility], which most of the time is unscheduled, there is a behavioral problem that needs to be addressed.  I asked Ms. Dipierro to address these things or changes would need to be made.

## 2010-04-25 NOTE — Assessment & Plan Note (Signed)
Summary: ACUTE-F/U WITH RASH PER DR PRIBULA OKAY TO ADD PER DR TALBOT/CFB   Vital Signs:  Patient profile:   45 year old female Height:      65 inches Weight:      404.3 pounds BMI:     66.50 Temp:     97.8 degrees F oral Pulse rate:   70 / minute BP sitting:   123 / 86  (right arm) Cuff size:   RIGHT WRIST REGULAR  Vitals Entered By: Lucky Rathke NT II (April 10, 2010 2:19 PM) CC: PATIENT IS HERE FOR RASH. Is Patient Diabetic? No Pain Assessment Patient in pain? no      Nutritional Status BMI of > 30 = obese  Have you ever been in a relationship where you felt threatened, hurt or afraid?No   Does patient need assistance? Functional Status Self care Ambulation Normal   Primary Care Provider:  Trish Fountain MD  CC:  PATIENT IS HERE FOR RASH.Kara Vincent  History of Present Illness: 45 y/o w with pmh as below comes for rash on her arm  Rash is red,itchies, scaling and on her right arm. going on for 1 week. no new medications prior to the rash. Has been on allopurinol for a while but not really caused the rash before. no fever, chills, or other systemic s/s    Preventive Screening-Counseling & Management  Alcohol-Tobacco     Alcohol drinks/day: 0     Smoking Status: quit     Year Quit: 1980's     Passive Smoke Exposure: no  Caffeine-Diet-Exercise     Does Patient Exercise: no     Times/week: <3 since been sick  Current Medications (verified): 1)  Proventil Hfa 108 (90 Base) Mcg/act  Aers (Albuterol Sulfate) .... One Puff Every 4 Hours As Needed 2)  Clonidine Hcl 0.2 Mg Tabs (Clonidine Hcl) .... Take One Pill By Mouth Twice Daily 3)  Benadryl 25 Mg  Caps (Diphenhydramine Hcl) .... Take One Pill Every 6 Hours As Needed For Allergies. 4)  Nebulizer  Misc (Nebulizers) .... Use As Required For Your Asthma, Up To Four Times A Day. Please Come To Ed If Persistent Breathing Problem. 5)  Albuterol Sulfate (2.5 Mg/77ml) 0.083% Nebu (Albuterol Sulfate) .... Use As Required  Not More Than Four Times A Day. Come To Ed If Persistent Breathing Problem. 6)  Percocet 5-325 Mg Tabs (Oxycodone-Acetaminophen) .... Take One Tab By Mouth Once Every 6 Hours As Needed For Pain 7)  Triamcinolone Acetonide 0.1 % Oint (Triamcinolone Acetonide) .... Place Thin Layer Over Rash Twice A Day 8)  Ammonium Lactate 12 % Crea (Ammonium Lactate) .... Apply Thin Layer To Affected Area Twice A Day 9)  Advair Diskus 100-50 Mcg/dose Misc (Fluticasone-Salmeterol) .... Take 1 Puff Two Times A Day. 10)  Salex 6 % Sham (Salicylic Acid) .... Apply To Scalp 1-2 Times Per Week 11)  Lisinopril 20 Mg Tabs (Lisinopril) .... Take 1 Tablet By Mouth Once A Day 12)  Eucerin  Lotn (Emollient) .... Apply Twice Daily On The Affected Areas. 13)  Ambien 5 Mg Tabs (Zolpidem Tartrate) .... Take 1 Tab By Mouth At Bedtime 14)  Protonix 40 Mg Tbec (Pantoprazole Sodium) .... Take 1 Tablet By Mouth Once A Day 15)  Tobrex 0.3 % Oint (Tobramycin Sulfate) .... Apply Thin Ribbon To Both Eyes Twice Daily For 7 Days. 16)  Trazodone Hcl 50 Mg Tabs (Trazodone Hcl) .... Take 1 Tablet By Mouth At Bedtime 17)  Alprazolam 1 Mg Tabs (Alprazolam) .Kara KitchenMarland KitchenMarland Vincent  Take One Tablet By Mouth Twice A Day As Needed For Anxiety 18)  Amitriptyline Hcl 75 Mg Tabs (Amitriptyline Hcl) .... Take 1 Tablet By Mouth At Bedtime  Allergies (verified): 1)  ! Zithromax (Azithromycin) 2)  ! * Cats 3)  ! * Bactrim Ds 4)  Ibuprofen  Social History: Does Patient Exercise:  no  Review of Systems  The patient denies anorexia, fever, weight loss, weight gain, vision loss, decreased hearing, hoarseness, chest pain, syncope, dyspnea on exertion, peripheral edema, prolonged cough, headaches, hemoptysis, abdominal pain, melena, hematochezia, severe indigestion/heartburn, hematuria, incontinence, genital sores, muscle weakness, suspicious skin lesions, transient blindness, difficulty walking, depression, unusual weight change, abnormal bleeding, enlarged lymph nodes,  angioedema, breast masses, and testicular masses.    Physical Exam  General:  alert and well-developed.   Skin:  red maculo-pappular rash on the right arm, with raised follicles, no drainage, s/sof infection. n similar rash elsewhere.    Impression & Recommendations:  Problem # 1:  SKIN RASH, ALLERGIC (ICD-692.9) -mac-pap rash on the right arm - for few weeks now - probably a drug rash vs dermatitis  plan - prednsione taper - topical hydrocortisone cream for itching - f-up in 2 weeks -   Complete Medication List: 1)  Proventil Hfa 108 (90 Base) Mcg/act Aers (Albuterol sulfate) .... One puff every 4 hours as needed 2)  Clonidine Hcl 0.2 Mg Tabs (Clonidine hcl) .... Take one pill by mouth twice daily 3)  Benadryl 25 Mg Caps (Diphenhydramine hcl) .... Take one pill every 6 hours as needed for allergies. 4)  Nebulizer Misc (Nebulizers) .... Use as required for your asthma, up to four times a day. please come to ed if persistent breathing problem. 5)  Albuterol Sulfate (2.5 Mg/41ml) 0.083% Nebu (Albuterol sulfate) .... Use as required not more than four times a day. come to ed if persistent breathing problem. 6)  Percocet 5-325 Mg Tabs (Oxycodone-acetaminophen) .... Take one tab by mouth once every 6 hours as needed for pain 7)  Triamcinolone Acetonide 0.1 % Oint (Triamcinolone acetonide) .... Place thin layer over rash twice a day 8)  Ammonium Lactate 12 % Crea (Ammonium lactate) .... Apply thin layer to affected area twice a day 9)  Advair Diskus 100-50 Mcg/dose Misc (Fluticasone-salmeterol) .... Take 1 puff two times a day. 10)  Salex 6 % Sham (Salicylic acid) .... Apply to scalp 1-2 times per week 11)  Lisinopril 20 Mg Tabs (Lisinopril) .... Take 1 tablet by mouth once a day 12)  Eucerin Lotn (Emollient) .... Apply twice daily on the affected areas. 13)  Ambien 5 Mg Tabs (Zolpidem tartrate) .... Take 1 tab by mouth at bedtime 14)  Protonix 40 Mg Tbec (Pantoprazole sodium) .... Take 1  tablet by mouth once a day 15)  Tobrex 0.3 % Oint (Tobramycin sulfate) .... Apply thin ribbon to both eyes twice daily for 7 days. 16)  Trazodone Hcl 50 Mg Tabs (Trazodone hcl) .... Take 1 tablet by mouth at bedtime 17)  Alprazolam 1 Mg Tabs (Alprazolam) .... Take one tablet by mouth twice a day as needed for anxiety 18)  Amitriptyline Hcl 75 Mg Tabs (Amitriptyline hcl) .... Take 1 tablet by mouth at bedtime 19)  Prednisone 10 Mg Tabs (Prednisone) .... 4 tabs today for next 3 table, 2 tabs a day for next 3 days and then 1 tab a day for 3 days, then stop 20)  Hydrocortisone 1 % Crea (Hydrocortisone) .... Twice a day on the affected area for a  week  Patient Instructions: 1)  Please schedule a follow-up appointment in 2 weeks. Prescriptions: HYDROCORTISONE 1 % CREA (HYDROCORTISONE) twice a day on the affected area for a week  #1 x 0   Entered by:   Lester [place] MD   Authorized by:   Eduardo Osier DO   Signed by:   Lester [place] MD on 04/10/2010   Method used:   Print then Give to Patient   [id number]:[id number] PREDNISONE 10 MG TABS (PREDNISONE) 4 tabs today for next 3 table, 2 tabs a day for next 3 days and then 1 tab a day for 3 days, then stop  #21 x 0   Entered by:   Lester [place] MD   Authorized by:   Eduardo Osier DO   Signed by:   Lester [place] MD on 04/10/2010   Method used:   Print then Give to Patient   [id number] HYDROCORTISONE 1 % CREA (HYDROCORTISONE) twice a day on the affected area for a week  #1 x 0   Entered by:   Lester [place] MD   Authorized by:   Eduardo Osier DO   Signed by:   Lester [place] MD on 04/10/2010   Method used:   Electronically to        [facility] (retail)       [phone]       [place], [place]  [address]       Ph: [phone]       Fax: [phone]   RxIDHJ:[id number] PREDNISONE 10 MG TABS (PREDNISONE) 4 tabs today for next 3 table, 2 tabs a day for next 3 days and then 1 tab a  day for 3 days, then stop  #21 x 0   Entered by:   Lester [place] MD   Authorized by:   Eduardo Osier DO   Signed by:   Lester  MD on 04/10/2010   Method used:   Electronically to        [facility] (retail)       [phone]       [place], [place]  [address]       Ph: [phone]       Fax: [phone]   RxID:   [id number]    Orders Added: 1)  Est. Patient Level III [id number]     Prevention & Chronic Care Immunizations   Influenza vaccine: Not documented   Influenza vaccine deferral: Refused  (02/07/2009)    Tetanus booster: Not documented    Pneumococcal vaccine: Not documented  Other Screening   Pap smear: Not documented    Mammogram: Not documented   Mammogram action/deferral: Ordered  (02/07/2009)   Smoking status: quit  (04/10/2010)  Lipids   Total Cholesterol: Not documented   LDL: Not documented   LDL Direct: Not documented   HDL: Not documented   Triglycerides: Not documented   Lipid panel due: 11/13/2009  Hypertension   Last Blood Pressure: 123 / 86  (04/10/2010)   Serum creatinine: 1.08  (05/16/2009)   Serum potassium 4.1  (05/16/2009)  Self-Management Support :   Personal Goals (by the next clinic visit) :      Personal blood pressure goal: 140/90  (10/19/2009)   Patient will work on the following items until the next clinic visit to reach self-care goals:  Medications and monitoring: take my medicines every day  (04/10/2010)     Eating: eat more vegetables, use fresh or frozen vegetables  (04/10/2010)     Activity: take a 30 minute walk every day  (05/16/2009)    Hypertension self-management support: Written self-care plan, Education handout, Resources for patients handout  (10/19/2009)

## 2010-04-25 NOTE — Progress Notes (Signed)
Summary: med refill/gp  Phone Note Refill Request Message from:  Fax from Pharmacy on March 12, 2010 9:32 AM  Refills Requested: Medication #1:  SALEX 6 % SHAM Apply to scalp 1-2 times per week   Last Refilled: 02/08/2010 last appt. 12/05/09.   Method Requested: Electronic Initial call taken by: Morrison Old RN,  March 12, 2010 9:32 AM  Follow-up for Phone Call        Refilled electronically.  Follow-up by: Bertha Stakes MD,  March 12, 2010 10:16 AM    Prescriptions: SALEX 6 % SHAM (SALICYLIC ACID) Apply to scalp 1-2 times per week  #179ml x 3   Entered and Authorized by:   Bertha Stakes MD   Signed by:   Bertha Stakes MD on 03/12/2010   Method used:   Electronically to        [phone] (retail)       [address]       [place],   [address]       Ph: [phone]       Fax: [phone]   RxID:   [id number]

## 2010-04-30 IMAGING — CR DG CHEST 2V
2 series · 2 of 2 positions shown · non-contrast
Comparison: 09/16/2007

CLINICAL DATA: Short of breath

CHEST - 2 VIEW

[w chest pa *]
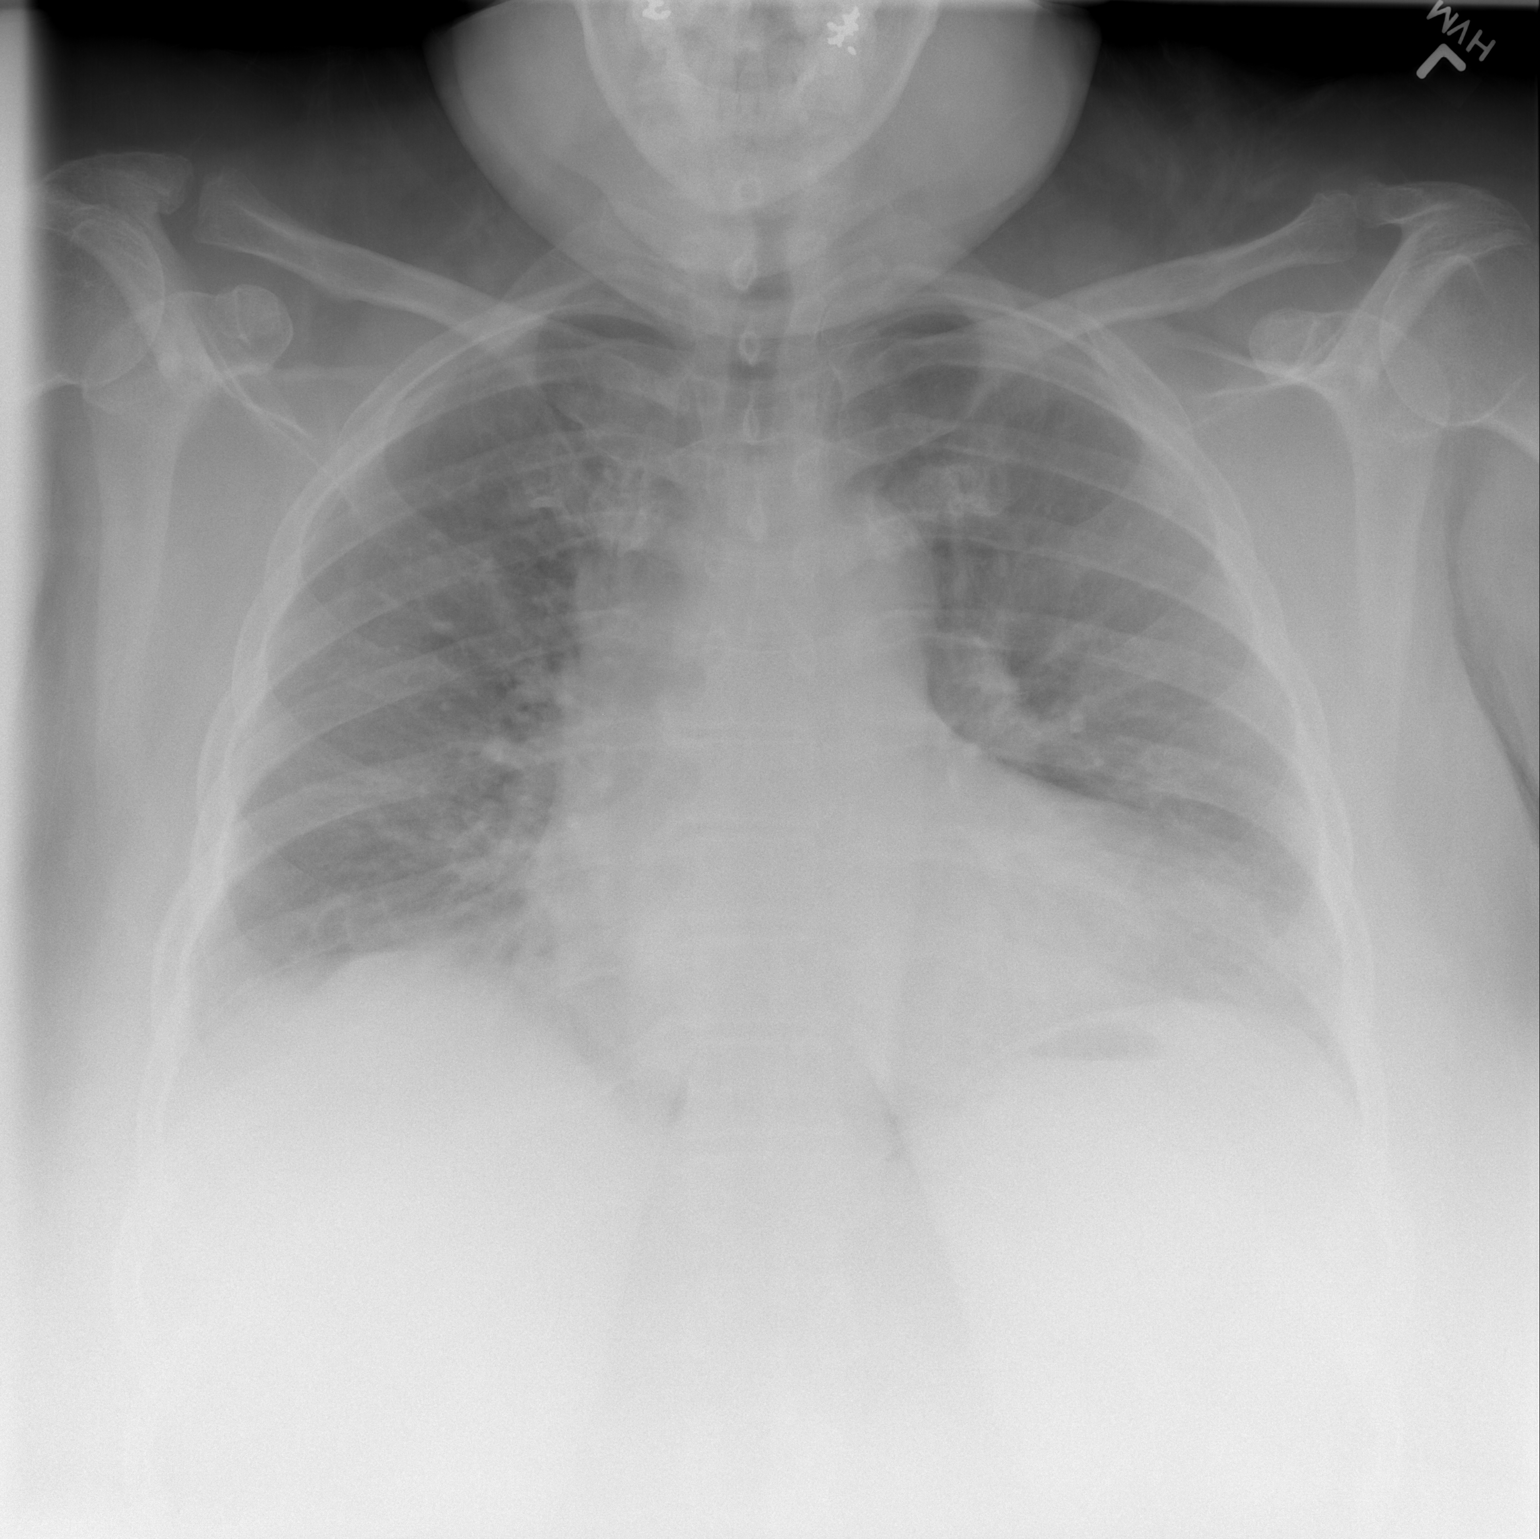

[w chest lat *]
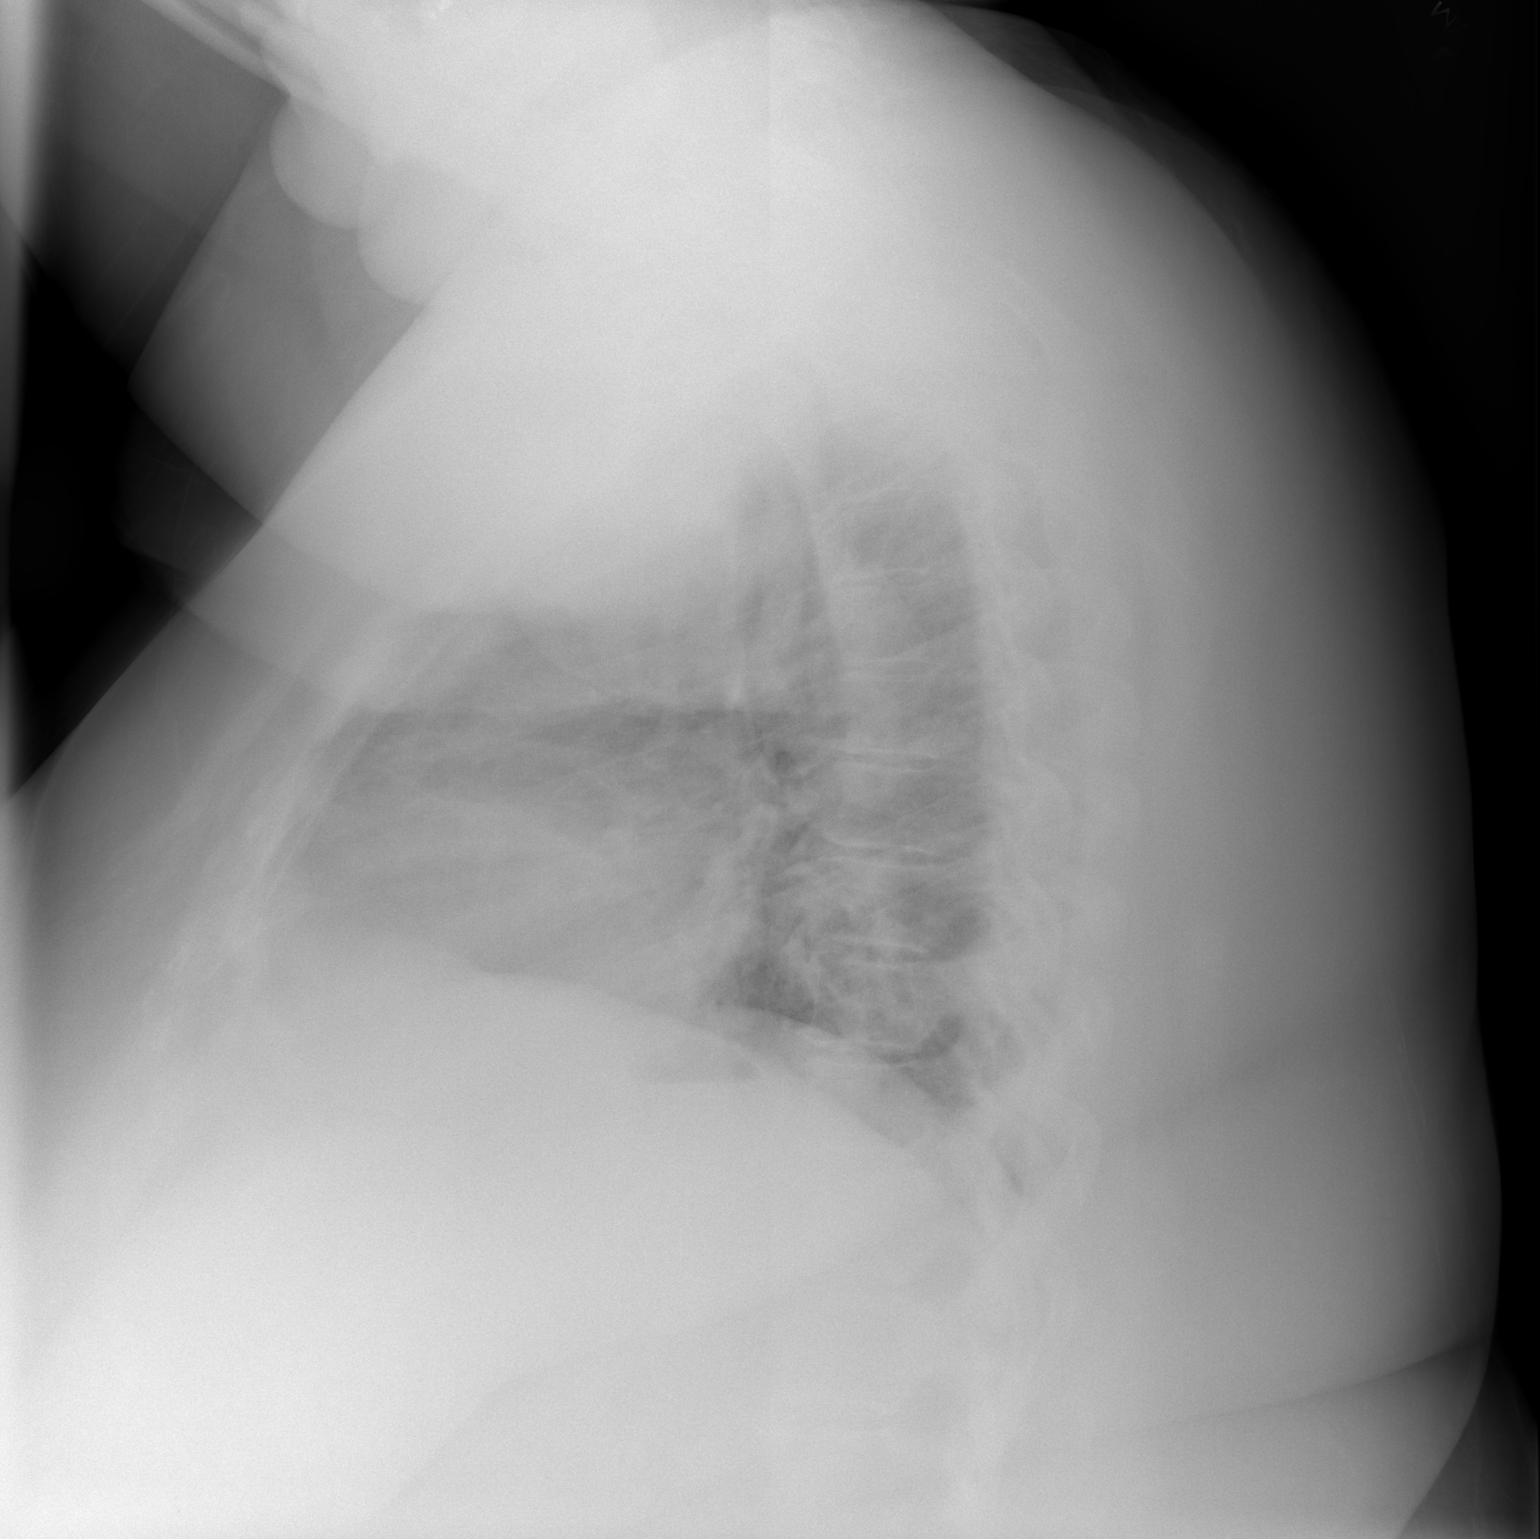

[2 of 2 positions shown; findings below may reference images not displayed]

FINDINGS: Suboptimal level of inspiration.  The heart enlarged.  No
frank congestive heart failure or pneumonia.  Osseous structures
intact with degenerative spurring of the T-spine..
IMPRESSION: 1.  Suboptimal inspiration.
2.  Cardiomegaly without frank congestive heart failure or
pneumonia.

## 2010-05-02 ENCOUNTER — Emergency Department (HOSPITAL_COMMUNITY)
Admission: EM | Admit: 2010-05-02 | Discharge: 2010-05-03 | Disposition: A | Discharge status: Home / Self Care | Payer: Medicaid Other | Attending: Emergency Medicine | Admitting: Emergency Medicine

## 2010-05-02 DIAGNOSIS — Q828 Other specified congenital malformations of skin: Secondary | ICD-10-CM | POA: Insufficient documentation

## 2010-05-02 DIAGNOSIS — H60399 Other infective otitis externa, unspecified ear: Secondary | ICD-10-CM | POA: Insufficient documentation

## 2010-05-02 DIAGNOSIS — R21 Rash and other nonspecific skin eruption: Secondary | ICD-10-CM | POA: Insufficient documentation

## 2010-05-02 DIAGNOSIS — I10 Essential (primary) hypertension: Secondary | ICD-10-CM | POA: Insufficient documentation

## 2010-05-02 IMAGING — CR DG CHEST 2V
2 series · 2 of 2 positions shown · non-contrast
Comparison: 10/12/2007

CLINICAL DATA: Chest pain and short of breath

CHEST - 2 VIEW

[w chest pa *]
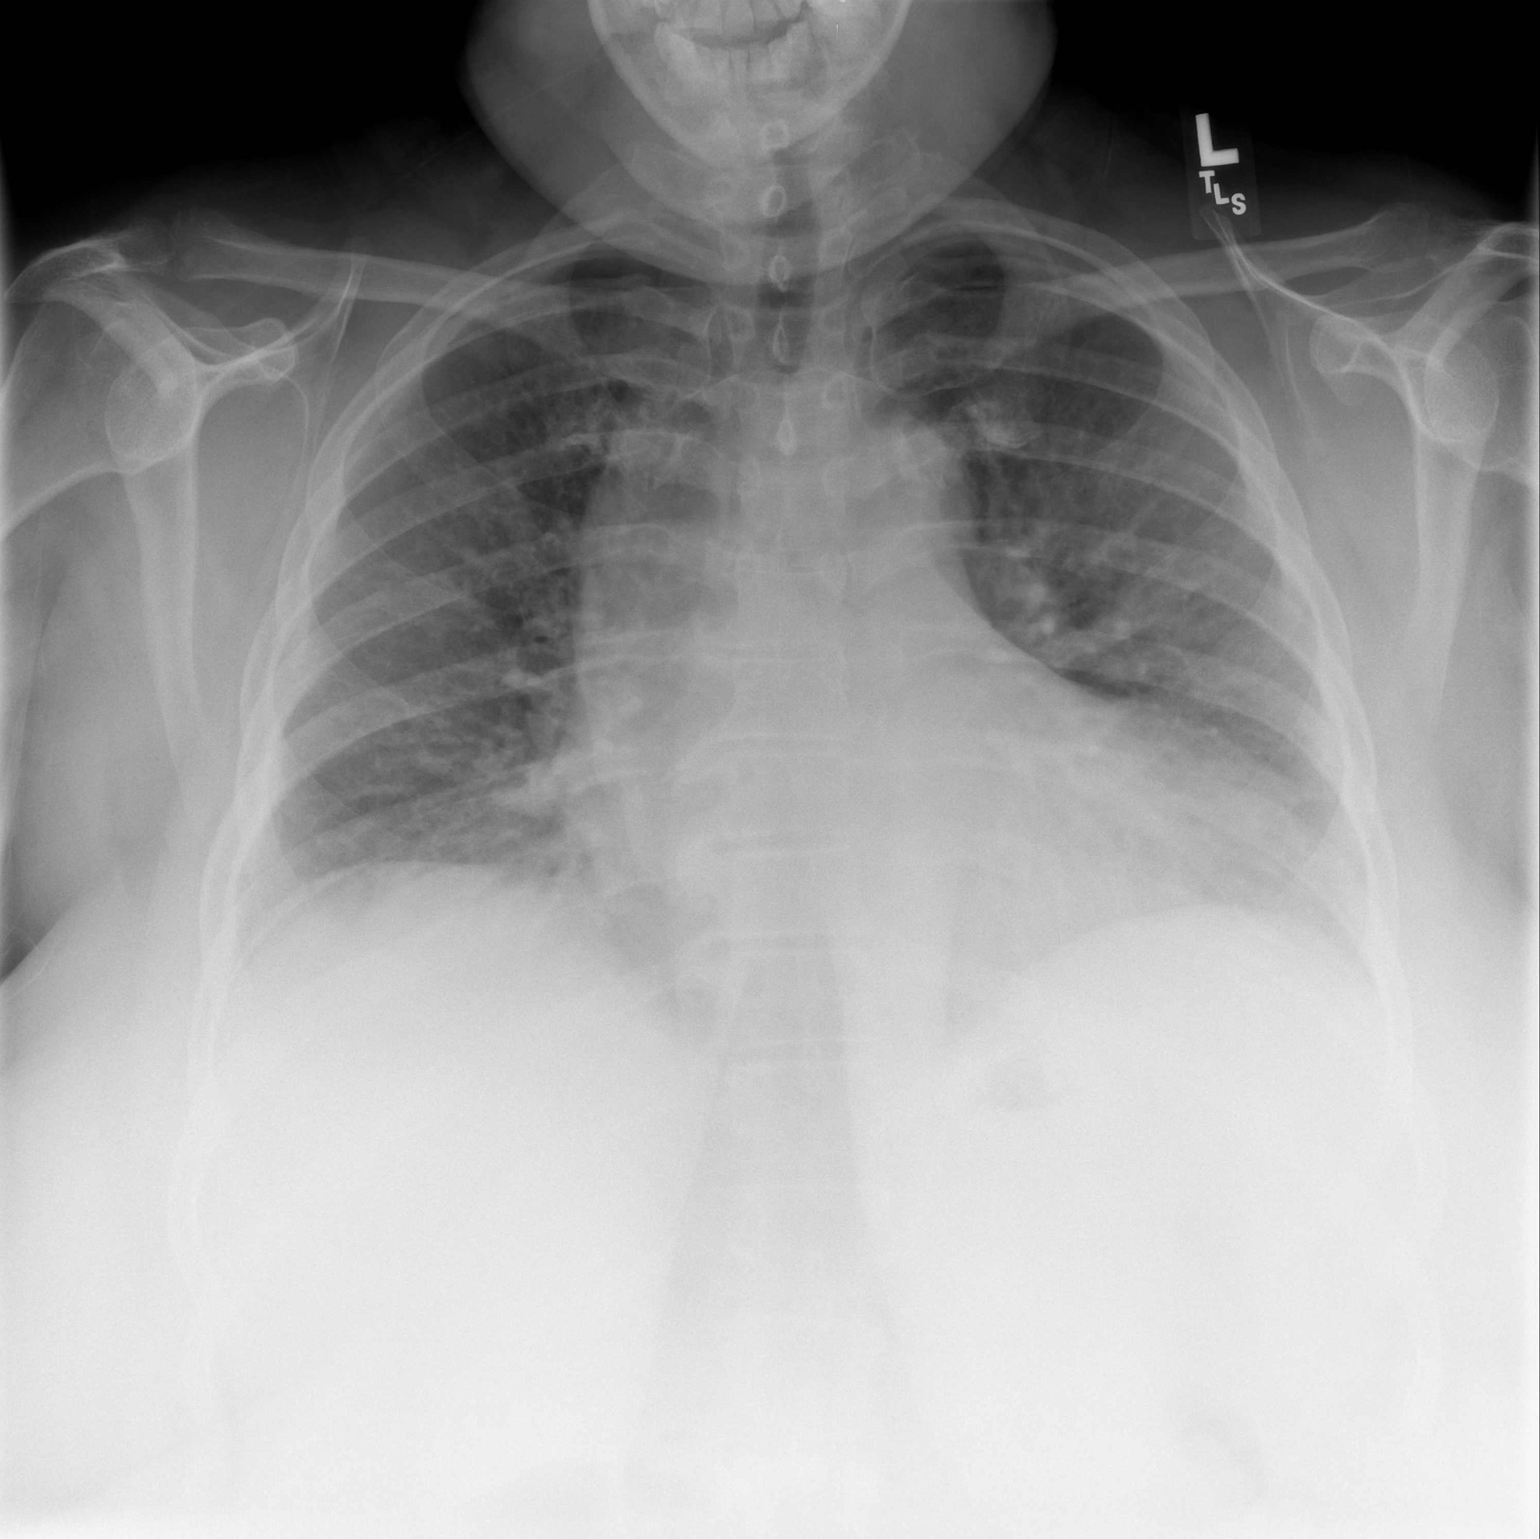

[w chest lat *]
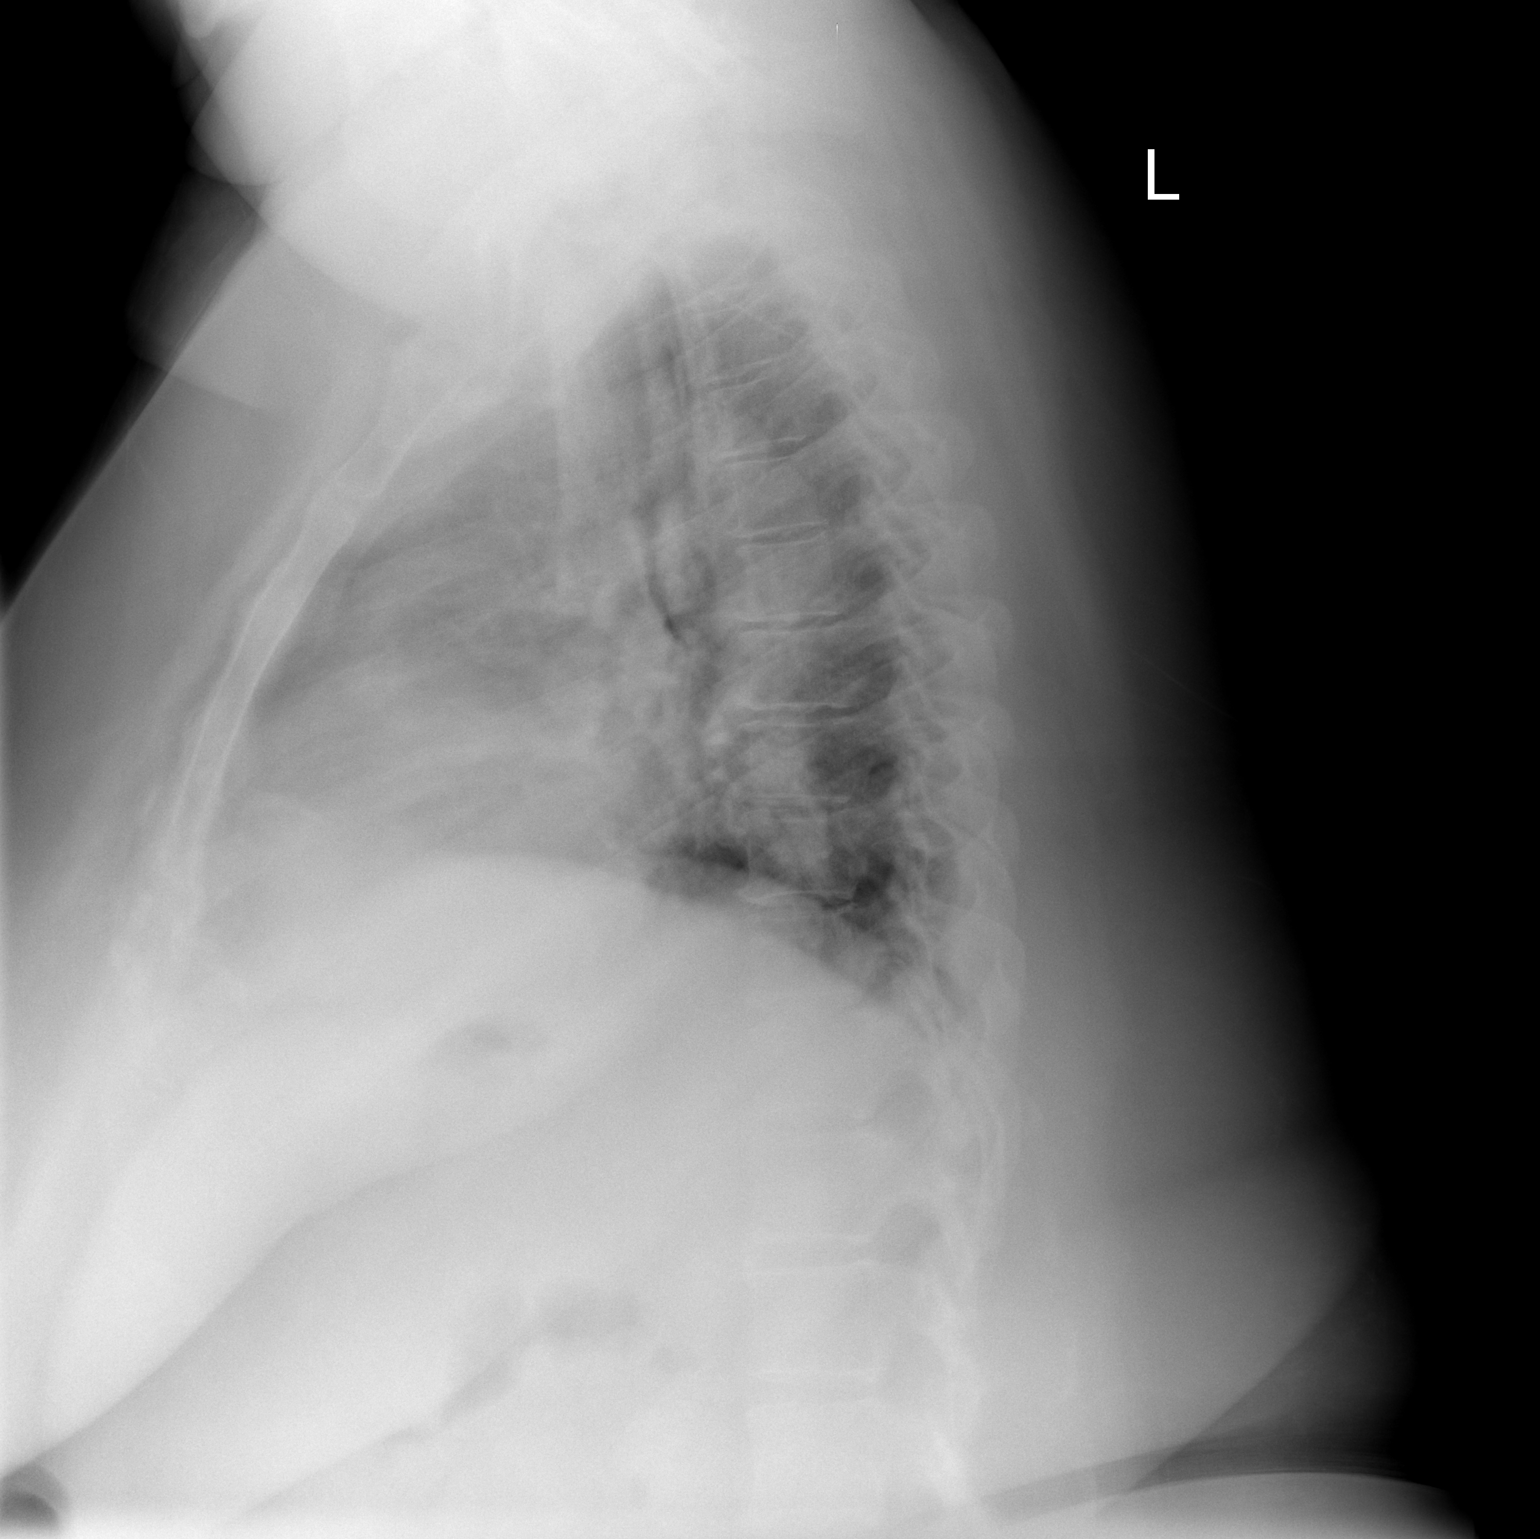

[2 of 2 positions shown; findings below may reference images not displayed]

FINDINGS: There is mild improvement in pulmonary edema.  There
remains vascular congestion and bibasilar atelectasis.  There is no
effusion.
IMPRESSION: Interval improvement in pulmonary edema.

## 2010-05-03 ENCOUNTER — Encounter: Payer: Self-pay | Admitting: *Deleted

## 2010-05-03 NOTE — Progress Notes (Unsigned)
Pt walked into clinic asking for appointment.  She was seen in ED last night, treated and released.  She was given Rx to fill to treat her condition. She is unhappy because the last time she was seen in ED for this rash on back and L leg she was admitted.  We have no appointments today. We did give her a scheduled appointment for early next week.   Pt asked to return to ED if she has any severe problems before her appointment in clinic.

## 2010-05-03 NOTE — Progress Notes (Signed)
Noted and agree. 

## 2010-05-07 ENCOUNTER — Ambulatory Visit (INDEPENDENT_AMBULATORY_CARE_PROVIDER_SITE_OTHER): Payer: Medicaid Other | Admitting: Internal Medicine

## 2010-05-07 ENCOUNTER — Encounter: Payer: Self-pay | Admitting: Internal Medicine

## 2010-05-07 VITALS — BP 151/104 | HR 73 | Temp 97.9°F | Ht 65.0 in | Wt >= 6400 oz

## 2010-05-07 DIAGNOSIS — J45909 Unspecified asthma, uncomplicated: Secondary | ICD-10-CM

## 2010-05-07 DIAGNOSIS — M549 Dorsalgia, unspecified: Secondary | ICD-10-CM

## 2010-05-07 DIAGNOSIS — I10 Essential (primary) hypertension: Secondary | ICD-10-CM

## 2010-05-07 MED ORDER — OXYCODONE-ACETAMINOPHEN 5-325 MG PO TABS: 1.0000 | ORAL_TABLET | Freq: Four times a day (QID) | ORAL | Status: DC | PRN

## 2010-05-07 MED ORDER — LISINOPRIL 20 MG PO TABS: 40.0000 mg | ORAL_TABLET | Freq: Every day | ORAL | Status: DC

## 2010-05-07 NOTE — Progress Notes (Signed)
  Subjective:    Patient ID: Kara Vincent, female    DOB: 29-Jun-1965, 45 y.o.   MRN: [id number]  HPI Pt is 45 yo female with PMH outlined below who comes in to [facility] for regular follow up on her BP medications. She has been checking her BP regularly and noted that even with her medical compliance her BP levels are running 140's/100's. She reports medical compliance with the rest of her medications. She has chronic skin rash but tells me she is using cream that she was told to use and that is helping somewhat. She denies any recent hospitalizations or sicknesses, no episodes of chest pain, no abdominal or urinary concerns.    Review of Systems  Constitutional: Negative.   HENT: Negative.   Respiratory: Negative.   Cardiovascular: Negative.   Gastrointestinal: Negative.   Genitourinary: Negative.   Psychiatric/Behavioral: Negative.        Objective:   Physical Exam  Constitutional: She appears well-developed and well-nourished. No distress.  HENT:  Head: Normocephalic and atraumatic.  Right Ear: External ear normal.  Left Ear: External ear normal.  Nose: Nose normal.  Mouth/Throat: Oropharynx is clear and moist. No oropharyngeal exudate.  Neck: Normal range of motion. Neck supple.  Cardiovascular: Normal rate, regular rhythm, normal heart sounds and intact distal pulses.  Exam reveals no gallop and no friction rub.   No murmur heard. Pulmonary/Chest: Effort normal and breath sounds normal. No respiratory distress. She has no wheezes. She has no rales. She exhibits no tenderness.  Abdominal: Soft. Bowel sounds are normal. She exhibits no distension and no mass. There is no tenderness. There is no rebound and no guarding.  Skin: Skin is warm and dry. Rash noted. She is not diaphoretic. No erythema. No pallor.          Assessment & Plan:

## 2010-05-07 NOTE — Assessment & Plan Note (Signed)
Continue the pain medication regimen.

## 2010-05-07 NOTE — Patient Instructions (Signed)
Please schedule follow up appointment in 6 months.  

## 2010-05-07 NOTE — Assessment & Plan Note (Signed)
Slightly above the goal, will increase the lisinopril to 40 mg once daily and I have advised to keep checking her BP regularly and to bring in the booklet that she has with her to her next appointment so that we can review it and make the appropriate adjustments.

## 2010-05-07 NOTE — Assessment & Plan Note (Signed)
Stable, well controlled on current medication regimen.

## 2010-05-11 IMAGING — CR DG CHEST 2V
2 series · 2 of 2 positions shown · non-contrast
Comparison: 10/14/2007

CLINICAL DATA: Asthma.  Chest pain and S O B.

CHEST - 2 VIEW

[w chest pa]
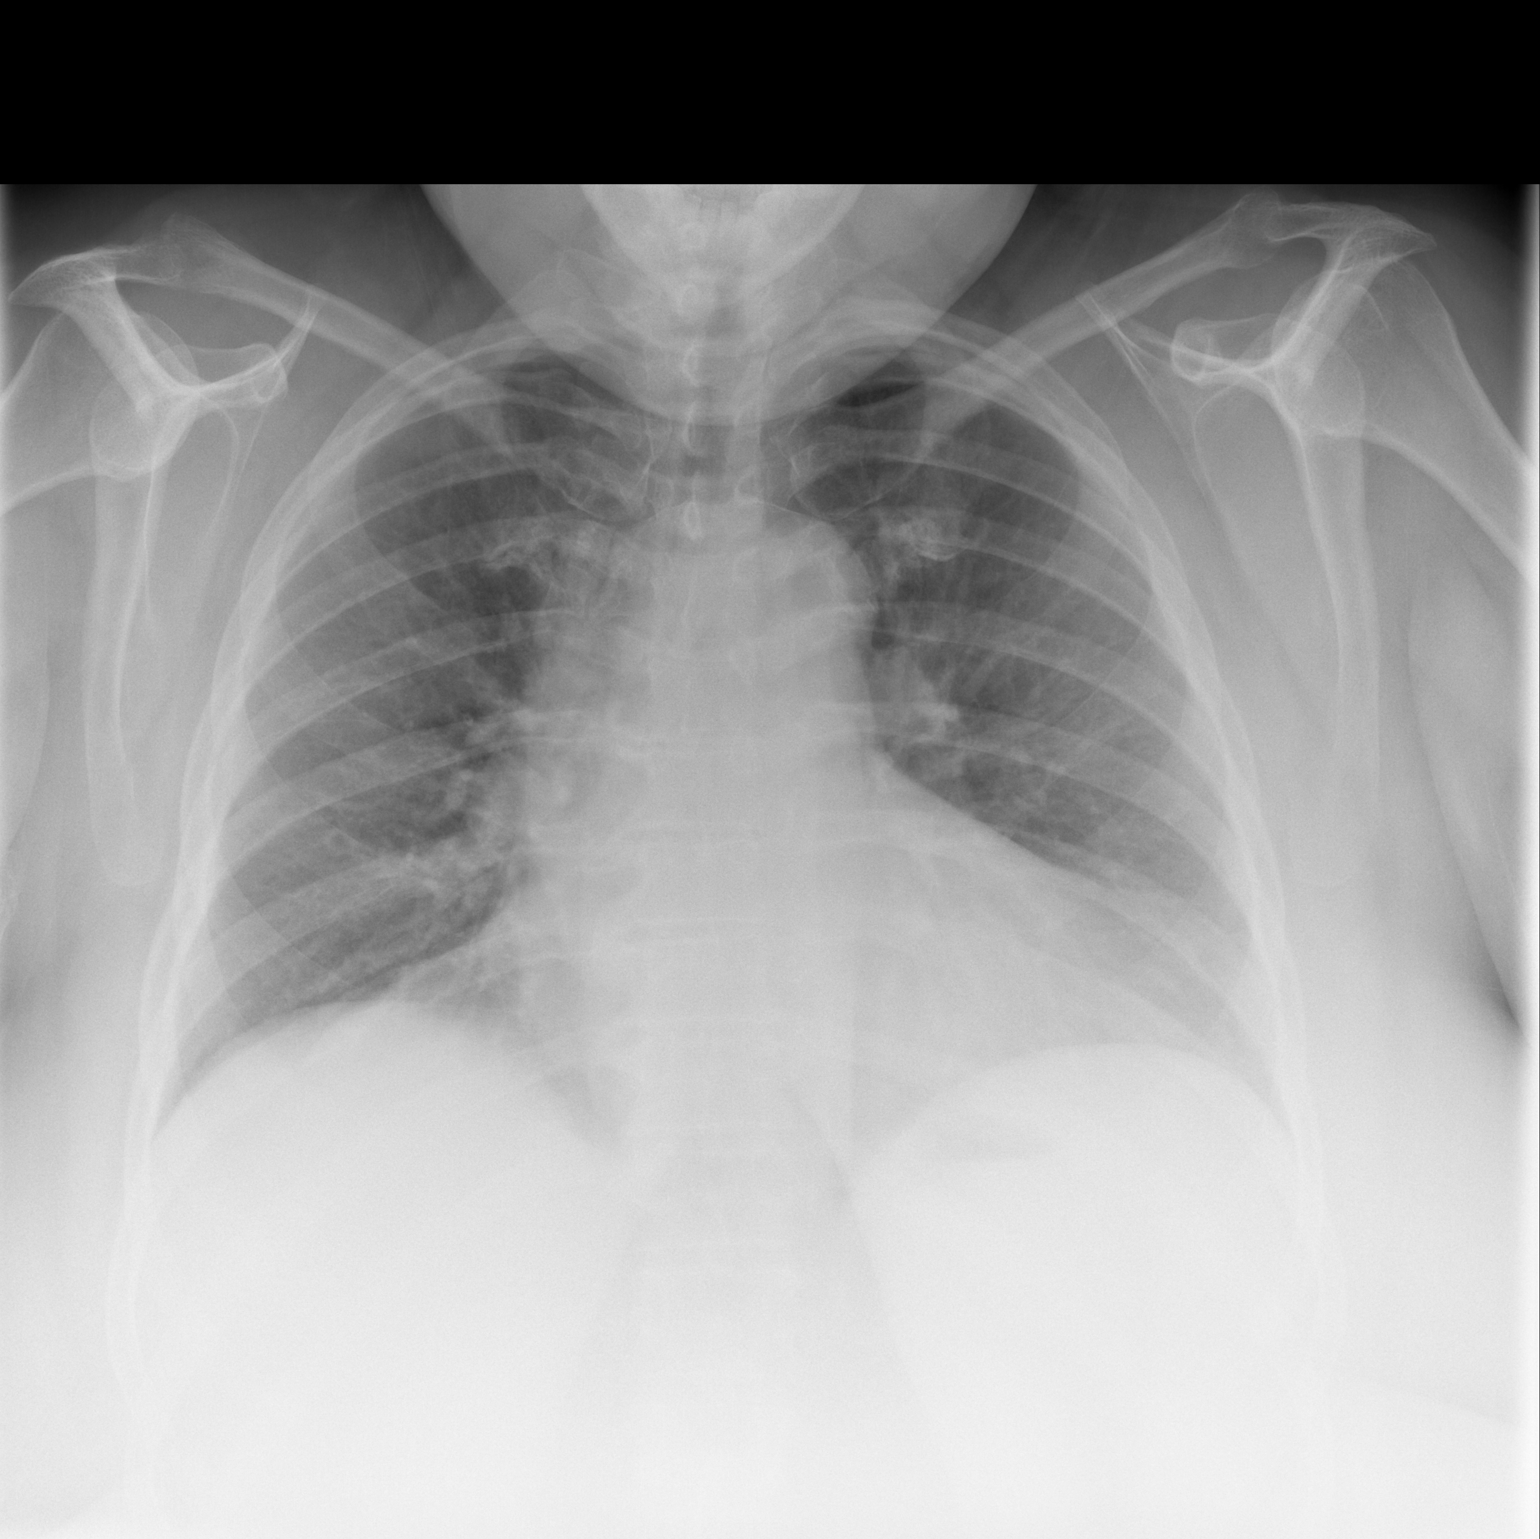

[w chest lat]
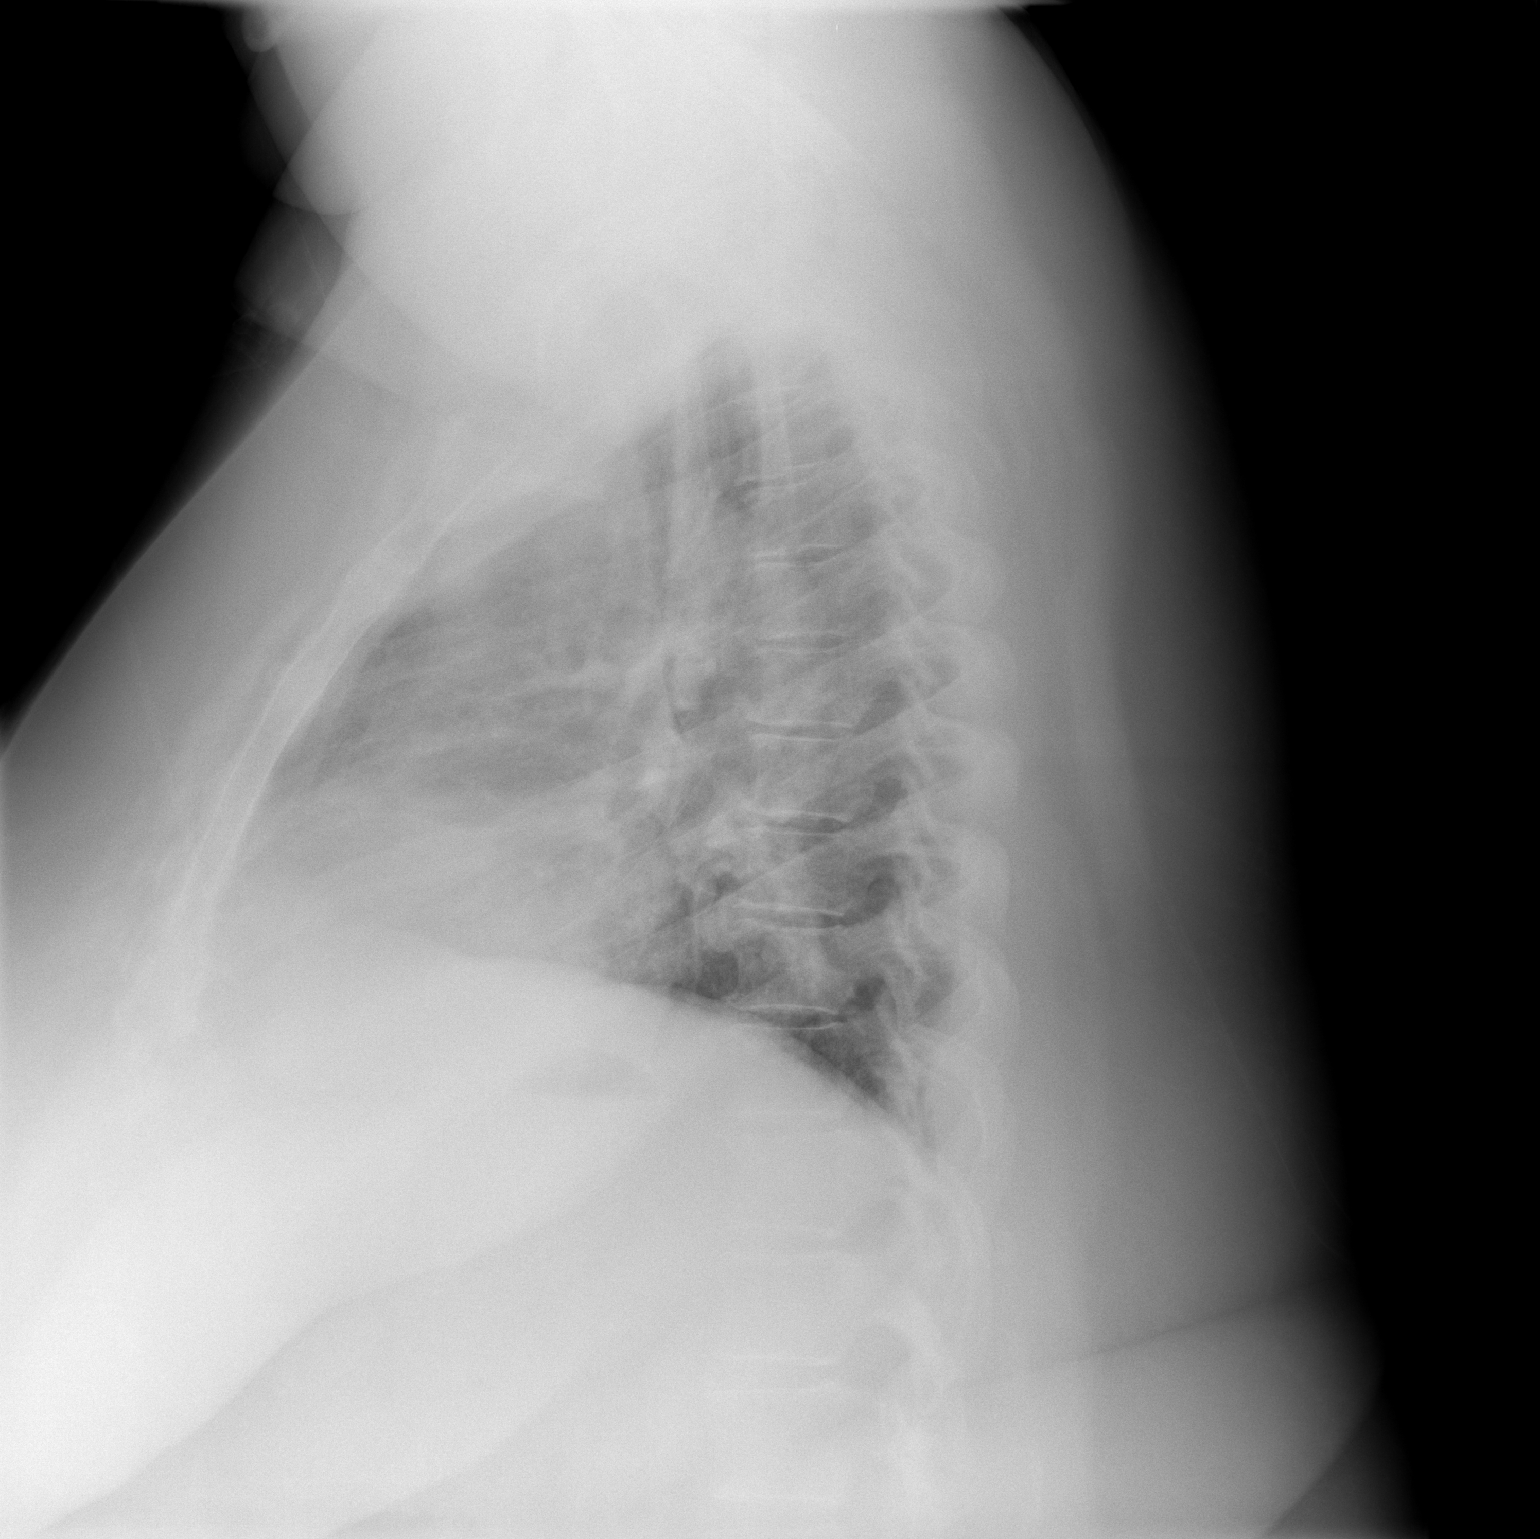

[2 of 2 positions shown; findings below may reference images not displayed]

FINDINGS: Cardiac enlargement, stable.

No pleural effusion or pulmonary interstitial edema

No airspace opacities identified.
IMPRESSION: 1.  Cardiac enlargement.
2.  No acute findings.

## 2010-05-13 ENCOUNTER — Telehealth: Payer: Self-pay | Admitting: *Deleted

## 2010-05-13 NOTE — Telephone Encounter (Signed)
Call from pt said that her rash is worse.  Wants to know if she can get something after the 22 of February.  Will call pt if there is an  opening.  Also pt said that if she cannot get an appointment before 05/15/2010 she will go to the ER.

## 2010-05-15 NOTE — Telephone Encounter (Signed)
If it is getting worse try to get her in.  I'm not in clinic until Friday and I think I'm booked up.  I'd prefer her not to be in the ED cause this could be taken care of in the clinic.  Kara Vincent saw her last too.

## 2010-05-23 ENCOUNTER — Telehealth: Payer: Self-pay | Admitting: *Deleted

## 2010-05-23 ENCOUNTER — Emergency Department (HOSPITAL_COMMUNITY)
Admission: EM | Admit: 2010-05-23 | Discharge: 2010-05-24 | Disposition: A | Discharge status: Home / Self Care | Payer: Medicaid Other | Attending: Emergency Medicine | Admitting: Emergency Medicine

## 2010-05-23 DIAGNOSIS — L299 Pruritus, unspecified: Secondary | ICD-10-CM | POA: Insufficient documentation

## 2010-05-23 DIAGNOSIS — Z79899 Other long term (current) drug therapy: Secondary | ICD-10-CM | POA: Insufficient documentation

## 2010-05-23 DIAGNOSIS — Q828 Other specified congenital malformations of skin: Secondary | ICD-10-CM | POA: Insufficient documentation

## 2010-05-23 DIAGNOSIS — Z8639 Personal history of other endocrine, nutritional and metabolic disease: Secondary | ICD-10-CM | POA: Insufficient documentation

## 2010-05-23 DIAGNOSIS — M129 Arthropathy, unspecified: Secondary | ICD-10-CM | POA: Insufficient documentation

## 2010-05-23 DIAGNOSIS — Z862 Personal history of diseases of the blood and blood-forming organs and certain disorders involving the immune mechanism: Secondary | ICD-10-CM | POA: Insufficient documentation

## 2010-05-23 DIAGNOSIS — F411 Generalized anxiety disorder: Secondary | ICD-10-CM | POA: Insufficient documentation

## 2010-05-23 DIAGNOSIS — J45909 Unspecified asthma, uncomplicated: Secondary | ICD-10-CM | POA: Insufficient documentation

## 2010-05-23 DIAGNOSIS — I10 Essential (primary) hypertension: Secondary | ICD-10-CM | POA: Insufficient documentation

## 2010-05-23 DIAGNOSIS — L738 Other specified follicular disorders: Secondary | ICD-10-CM | POA: Insufficient documentation

## 2010-05-23 NOTE — Telephone Encounter (Signed)
Pt called asking for appt for rash, states it is worse and she knows it is infected, she states she needs to be seen and admitted for treatment of the rash. She is informed there are no appts available in the next few days by cboone and she was advised by doris, i spoke w/ dr Stann Mainland and talbot, she is to go to ED for treatment.

## 2010-05-24 ENCOUNTER — Telehealth: Payer: Self-pay | Admitting: *Deleted

## 2010-05-24 ENCOUNTER — Encounter: Payer: Self-pay | Admitting: Internal Medicine

## 2010-05-24 ENCOUNTER — Other Ambulatory Visit: Payer: Self-pay | Admitting: Internal Medicine

## 2010-05-24 DIAGNOSIS — Q828 Other specified congenital malformations of skin: Secondary | ICD-10-CM

## 2010-05-24 LAB — POCT I-STAT, CHEM 8
BUN: 38 mg/dL — ABNORMAL HIGH (ref 6–23)
Calcium, Ion: 1.01 mmol/L — ABNORMAL LOW (ref 1.12–1.32)
Hemoglobin: 14.6 g/dL (ref 12.0–15.0)
Sodium: 140 mEq/L (ref 135–145)
TCO2: 25 mmol/L (ref 0–100)

## 2010-05-24 LAB — DIFFERENTIAL
Basophils Absolute: 0 10*3/uL (ref 0.0–0.1)
Basophils Relative: 0 % (ref 0–1)
Eosinophils Absolute: 0.2 10*3/uL (ref 0.0–0.7)
Monocytes Relative: 6 % (ref 3–12)
Neutro Abs: 7.5 10*3/uL (ref 1.7–7.7)
Neutrophils Relative %: 66 % (ref 43–77)

## 2010-05-24 LAB — CBC
Hemoglobin: 13.6 g/dL (ref 12.0–15.0)
MCH: 28.9 pg (ref 26.0–34.0)
MCHC: 31.8 g/dL (ref 30.0–36.0)
Platelets: 239 10*3/uL (ref 150–400)
RBC: 4.7 MIL/uL (ref 3.87–5.11)

## 2010-05-24 NOTE — Telephone Encounter (Signed)
I agree with either of Jim's suggestions.  The patient has been told multiple times that we will admit if we see clinical indication and she was just seen last night in the ED and was told to make an appointment in 7-10 days.

## 2010-05-24 NOTE — Telephone Encounter (Signed)
Noted  

## 2010-05-24 NOTE — Telephone Encounter (Signed)
If you can find a spot for her she can be seen.  She probably needs to follow up with Dermatology since I think its been a while since she did.  She was seen by Dr. Karren Cobble in the ED last night for a flare of her usual rash because she stopped her prednisone.
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
## 2010-05-24 NOTE — Assessment & Plan Note (Addendum)
Patient came to the ER tonight complaining of rash all over her body. Seems to be getting worse since last 2 weeks. She states she was seen in the clinic then the rash flared up and she was prescribed prednisone and doxycycline. She finished the course of these antibiotics and steroids. The rash is somewhat better although not completely resolved. She strongly requests admission in the hospital for IV antibiotics and IV steroids. She denies fever , changes , shortness of breath , pus drainage from vesicles severe pain in the areas of rash or swelling.    On physical exam patient has generalized rash all over her body. The rash is more severe on the back. There are areas of excoriation on the back and arms. There is no evidence of infection , pus drainage  or cellulitis.  She is afebrile in the emergency room. Vitals unremarkable. She has received IV Solu-Medrol x1, benadryl and percocet in the ER.  Labs show mildly elevated WBC in setting or recent steroid use otherwise normal.   given the lack of  Signs and symptoms of infection,  No major exacerbation of her chronic rash and stable vitals,  I will discharge the patient from the emergency room. I asked the emergency room physician to give her a prescription of prednisone taper  and Diflucan  Powder for evidence of fungal infection under her breasts and in the groin area. I also advised her to keep the area clean and use her triamcinolone ointment as  Prescribed. Patient does not seem to need any  Systemic antibiotics at this time.  I will make an effort to schedule an appointment with the outpatient clinic next week for followup on her rash.   patient is requesting a percocet prescription. I see that she has a pain contract signed the clinic and she received a prescription of Percocet on 14 February. Her pain is in her knees which is a chronic pain from arthritis based on the notes.  I will ask her to use over-the-counter pain medication and heating pads  for her knee pain at this time.  No pain medication prescription is given from the ER.

## 2010-05-24 NOTE — Telephone Encounter (Addendum)
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 Saw Dr Redmond Pulling on regular basis until she left. I could find no documentation of issues during that time. Since then, seen by multiple MD's, infrequently.   Documentation 04/10/10 of inappropriate behavoir and given warning. Now new documentation 05/24/10 of inappropriate behavior.  Concern over narc usage also. Contract for Percocet #60 11/10 with Dr Redmond Pulling.  No UDS since 2010 and it was inappropriate See [place] narc database 04/26/10 - multiple prescribers and two pharmacies Called [facility] and [place] - getting oxycodone and alprazolam from OB/Gyn Drs [name] and Alfonse Spruce  Will discuss 05/27/10 [facility] mtg - dismissal versus freq q 2 week, scheduled appts with same provider. Enforcement of narc contract versus no narcs from [facility]. If we continue to see her, additional behavior issues would result in dismissal.

## 2010-05-27 ENCOUNTER — Encounter: Payer: Self-pay | Admitting: Internal Medicine

## 2010-05-27 ENCOUNTER — Telehealth: Payer: Self-pay | Admitting: Internal Medicine

## 2010-05-27 ENCOUNTER — Ambulatory Visit (INDEPENDENT_AMBULATORY_CARE_PROVIDER_SITE_OTHER): Payer: Medicaid Other | Admitting: Internal Medicine

## 2010-05-27 DIAGNOSIS — L0291 Cutaneous abscess, unspecified: Secondary | ICD-10-CM

## 2010-05-27 DIAGNOSIS — L0390 Cellulitis, unspecified: Secondary | ICD-10-CM | POA: Insufficient documentation

## 2010-05-27 DIAGNOSIS — I10 Essential (primary) hypertension: Secondary | ICD-10-CM

## 2010-05-27 DIAGNOSIS — H10029 Other mucopurulent conjunctivitis, unspecified eye: Secondary | ICD-10-CM

## 2010-05-27 DIAGNOSIS — Q828 Other specified congenital malformations of skin: Secondary | ICD-10-CM

## 2010-05-27 DIAGNOSIS — H1030 Unspecified acute conjunctivitis, unspecified eye: Secondary | ICD-10-CM

## 2010-05-27 DIAGNOSIS — M549 Dorsalgia, unspecified: Secondary | ICD-10-CM

## 2010-05-27 MED ORDER — CLINDAMYCIN HCL 150 MG PO CAPS: 150.0000 mg | ORAL_CAPSULE | Freq: Four times a day (QID) | ORAL | Status: AC

## 2010-05-27 NOTE — Assessment & Plan Note (Signed)
Skin rash appears to be at baseline, as compared to when I have previously seen patient. Just received prednisone taper at ER on 05/24/10.  - Recommended to continue topical creams as prescribed - Recommended close dermatology follow-up. As per our records, it has been > 1 year since Kara Vincent has been followed by [name] despite multiple recommendation to do so.

## 2010-05-27 NOTE — Progress Notes (Signed)
  Subjective:    Patient ID: Kara Vincent, female    DOB: 12-19-1965, 45 y.o.   MRN: [id number]  HPI Patient is a 45yo female with history of Darier disease, morbid obesity, hypertension, asthma who presents to clinic today for the following: 1) Darier disease - patient feels rash is getting worse x 3-4 weeks, is distributed over arms and back, very itchy. Was seen in ED on 05/24/10 for rash, was prescribed prednisone and nystatin powder, which she completed with mild improvement of symptoms. Has not followed up with dermatology. No open sores, taking medications regularly. No fevers, chills, or focal areas of warmth or erythema on trunk or arms.   2) Eye pain and swelling - patient states eye redness, swelling, erythema with white discharge x 2 weeks. Eye is itchy, stinging. No trauma to eye. Saw some doctor, unable to specify, some time ago (unable to specify), who gave eye drops (unable to specify) that she has been taking without relief of symptoms. States blurriness of vision secondary to irritation, but no photophobia, headaches, reduced visual acuity. No inability to keep the eye open or foreign body sensation.  3) Chronic back and knee pain - patient describes persistent pain, was prescribed Percocet 5-325mg  QID #60 by Dr. Lynnae January on 05/24/10, and states she has used up all of the medications already. Per Dr. Zenovia Jarred note, patient is already also receiving pain medication by Dr. [name] and Dr. Alfonse Spruce (OB/GYN).   4) HTN - requesting refill of Lisinopril, which was increased to 20mg  BID 05/07/10. No headaches, chest pain, palpitations, tachycardia.  Review of Systems Per HPI    Objective:   Physical Exam General: Vital signs reviewed and noted. Well-developed,in no acute distress; alert,appropriate and cooperative throughout examination. Head: normocephalic, atraumatic. Eyes: left eye with diffuse conjunctival injection, no corneal opacity, PERRL. EOMI. Ears: skin dry, TM  nonerythematous, good light reflex. Canal without swelling.  Neck: No deformities, masses, or tenderness noted. Lungs: Normal respiratory effort. Clear to auscultation BL without crackles or wheezes.  Heart: RRR. S1 and S2 normal without gallop, murmur, or rubs.  Abdomen: obese, BS normoactive. Soft, Nondistended, non-tender.   Extremities: No pretibial edema. Skin: papular rash on BL arms, back with few 2-3 encrusted lesions on back, lower abdomen. No surrounding soft tissue erythema, warmth.         Assessment & Plan:

## 2010-05-27 NOTE — Assessment & Plan Note (Signed)
Patient requests refills of Lisinopril, which was recently increased to 20mg  BID. However, per last BMET, creatinine had increased to 1.7 from prior baseline of < 1 (04/2009). - Wanted to check BMET today, which patient refused - May need further adjustment of medications, but at this time pt is refusing because I will not provide more pain meds.

## 2010-05-27 NOTE — Assessment & Plan Note (Signed)
Patient describes persistent back pain and knee pain. However, she just had a refill of Percocet provided by Dr. Lynnae January on 05/24/10 #60. States she is out of meds. Patient is also being prescribed pain meds at [facility] office. Patient informed of this decision and became irate.  - Will not refill pain meds at this time, as in violation of pain contract, patient receiving pain meds both at our clinic and OB/GYN. Additionally, just had a refill.

## 2010-05-27 NOTE — Patient Instructions (Signed)
1) Please follow-up at the clinic in 2 weeks, at which time we will reevaluate your eye swelling, blood pressure, pain. 2) You have been started on Clindamycin, if you develop rash, throat closing, tongue swelling, please stop the medication and call the clinic at [phone]. 3) Please bring all of your medications in a bag to your next visit.

## 2010-05-27 NOTE — Assessment & Plan Note (Signed)
Left eye.  - Will treat with Clindamycin x 10 days for conjunctivitis and cellulitis.

## 2010-05-27 NOTE — Assessment & Plan Note (Signed)
Soft tissues below left eye.  - Will treat with Clindamycin x 10 days, which should cover both facial cellulitis and bacterial conjunctivitis.

## 2010-05-27 NOTE — Assessment & Plan Note (Deleted)
Patient called the clinic back stating that her left eye is bleeding, unable to assess if it is external bleeding from her picking at her skin or if it is within eye itself. No acute visual changes. Patient was recommended that if bleeding is on external lid with identifiable lesion, to place pressure over bleeding area, and make appointment to return to clinic soon. However, if bleeding is from unidentifiable site, or within eye itself, she should proceed to urgent care or ER.

## 2010-05-27 NOTE — Telephone Encounter (Signed)
Patient was seen in clinic this AM for acute bacterial conjunctivitis of left eye, and facial cellulitis. Was given Rx for Clindamycin. Patient called the clinic this afternoon stating that her left eye is bleeding, unable to assess if it is external bleeding from her picking at her skin or if it is within eye itself. No acute visual changes. Patient was recommended that if bleeding is on external lid with identifiable lesion, to place pressure over bleeding area, and make appointment to return to clinic soon. However, if bleeding is from unidentifiable site, or within eye itself, she should proceed to urgent care or ER.

## 2010-05-30 ENCOUNTER — Other Ambulatory Visit: Payer: Self-pay

## 2010-05-30 DIAGNOSIS — Z1231 Encounter for screening mammogram for malignant neoplasm of breast: Secondary | ICD-10-CM

## 2010-06-02 ENCOUNTER — Emergency Department (HOSPITAL_COMMUNITY)
Admission: EM | Admit: 2010-06-02 | Discharge: 2010-06-03 | Disposition: A | Discharge status: Home / Self Care | Payer: Medicaid Other | Attending: Emergency Medicine | Admitting: Emergency Medicine

## 2010-06-02 DIAGNOSIS — I10 Essential (primary) hypertension: Secondary | ICD-10-CM | POA: Insufficient documentation

## 2010-06-02 DIAGNOSIS — J45909 Unspecified asthma, uncomplicated: Secondary | ICD-10-CM | POA: Insufficient documentation

## 2010-06-02 DIAGNOSIS — R21 Rash and other nonspecific skin eruption: Secondary | ICD-10-CM | POA: Insufficient documentation

## 2010-06-02 DIAGNOSIS — Z7982 Long term (current) use of aspirin: Secondary | ICD-10-CM | POA: Insufficient documentation

## 2010-06-02 DIAGNOSIS — Z8639 Personal history of other endocrine, nutritional and metabolic disease: Secondary | ICD-10-CM | POA: Insufficient documentation

## 2010-06-02 DIAGNOSIS — M129 Arthropathy, unspecified: Secondary | ICD-10-CM | POA: Insufficient documentation

## 2010-06-02 DIAGNOSIS — E669 Obesity, unspecified: Secondary | ICD-10-CM | POA: Insufficient documentation

## 2010-06-02 DIAGNOSIS — Q828 Other specified congenital malformations of skin: Secondary | ICD-10-CM | POA: Insufficient documentation

## 2010-06-02 DIAGNOSIS — Z862 Personal history of diseases of the blood and blood-forming organs and certain disorders involving the immune mechanism: Secondary | ICD-10-CM | POA: Insufficient documentation

## 2010-06-02 DIAGNOSIS — Z79899 Other long term (current) drug therapy: Secondary | ICD-10-CM | POA: Insufficient documentation

## 2010-06-03 ENCOUNTER — Encounter: Payer: Self-pay | Admitting: Internal Medicine

## 2010-06-03 LAB — CBC
HCT: 37.5 % (ref 36.0–46.0)
Platelets: 196 10*3/uL (ref 150–400)
RBC: 4.15 MIL/uL (ref 3.87–5.11)
RDW: 15.2 % (ref 11.5–15.5)
WBC: 21.3 10*3/uL — ABNORMAL HIGH (ref 4.0–10.5)

## 2010-06-03 LAB — POCT I-STAT, CHEM 8
BUN: 31 mg/dL — ABNORMAL HIGH (ref 6–23)
Chloride: 99 mEq/L (ref 96–112)
HCT: 38 % (ref 36.0–46.0)
Potassium: 3.4 mEq/L — ABNORMAL LOW (ref 3.5–5.1)

## 2010-06-03 NOTE — Progress Notes (Signed)
Pt with h/o Darier's disease, presenting to the ED with persistent and worsening skin lesions, this time located on her back primarily. She had been seen in the clinic March 5 and at that time, she appeared to be at baseline for her Darier's disease, however, she was given Clindamycin 150mg  qid for bacterial conjunctivitis and facial cellulitis. She states she has been compliant and is still taking it.   Today, she complains of increased pain and drainage in the affected areas. She is afebrile and vital signs are stable. She does have an elevated white count which can be attributed to her steroid use.   On skin exam, she has 3 open lesions on her mid to lower back that are about 2-3cm in diameter. 2 of these lesions appear to be in healing stages and one lesion is draining purulent material. The skin around this lesion is warm, tender and erythematous. This is the area that is most concerning for a cellulitis. She also has 2 small dry crusty yellow lesions on her scalp, around the left temporal area that have the appearance of sebhorric dermatitis. She complains that it is itchy and so she has been scratching all these lesions, which most likely increase her chances of repeated infection. Otherwise, she appears to be at baseline for the other skin lesions.  Given that she is clinically stable, and her physical exam is concerning for a cellulitis, will discharge patient to home with the following plan: - Doxycycline 100mg  BID for 10 days. (Clindamycin dose was 600mg  daily and this is a dose for conjunctivitis and not for cellulitis). - Cetaphil cream - with instructions to apply to the affected area 4-5times a day - f/u at [facility] in the next 2-3 days - needs to follow up with Dermatology as soon as possible. A referral has been made for her several times in the past but patient has not followed up in more than a year. This will be discussed with her at her [facility] appointment. - instructed to keep skin clean,  moisturized and to reduce scratching as this raises likelihood for an infection. - check CBC and BMET at follow up [facility] appt.   ED Labs:   TCO2                                     28                0-100            mmol/L  Ionized Calcium                          1.03       l      1.12-1.32        mmol/L  Hemoglobin (HGB)                         12.9              12.0-15.0        g/dL  Hematocrit (HCT)                         38.0              36.0-46.0        %  Sodium (NA)  137               135-145          mEq/L  Potassium (K)                            3.4        l      3.5-5.1          mEq/L  Chloride                                 99                96-112           mEq/L  Glucose                                  100        h      70-99            mg/dL  BUN                                      31         h      6-23             mg/dL  Creatinine                               1.6        h      0.4-1.2          Mg/dL   WBC                                      21.3       h      4.0-10.5         K/uL  RBC                                      4.15              3.87-5.11        MIL/uL  Hemoglobin (HGB)                         12.2              12.0-15.0        g/dL  Hematocrit (HCT)                         37.5              36.0-46.0        %  MCV                                      90.4  78.0-100.0       fL  MCH -                                    29.4              26.0-34.0        pg  MCHC                                     32.5              30.0-36.0        g/dL  RDW                                      15.2              11.5-15.5        %  Platelet Count (PLT)                     196               150-400          K/uL

## 2010-06-04 ENCOUNTER — Other Ambulatory Visit: Payer: Self-pay | Admitting: Family Medicine

## 2010-06-04 DIAGNOSIS — Z1231 Encounter for screening mammogram for malignant neoplasm of breast: Secondary | ICD-10-CM

## 2010-06-05 ENCOUNTER — Inpatient Hospital Stay (HOSPITAL_COMMUNITY)
Admission: EM | Admit: 2010-06-05 | Discharge: 2010-06-12 | DRG: 603 | Disposition: A | Discharge status: Home Health | Payer: Medicaid Other | Attending: Internal Medicine | Admitting: Internal Medicine

## 2010-06-05 ENCOUNTER — Telehealth: Payer: Self-pay | Admitting: *Deleted

## 2010-06-05 DIAGNOSIS — G4733 Obstructive sleep apnea (adult) (pediatric): Secondary | ICD-10-CM | POA: Diagnosis present

## 2010-06-05 DIAGNOSIS — Z6841 Body Mass Index (BMI) 40.0 and over, adult: Secondary | ICD-10-CM

## 2010-06-05 DIAGNOSIS — M545 Low back pain, unspecified: Secondary | ICD-10-CM | POA: Diagnosis present

## 2010-06-05 DIAGNOSIS — L0291 Cutaneous abscess, unspecified: Secondary | ICD-10-CM

## 2010-06-05 DIAGNOSIS — J45909 Unspecified asthma, uncomplicated: Secondary | ICD-10-CM | POA: Diagnosis present

## 2010-06-05 DIAGNOSIS — A4902 Methicillin resistant Staphylococcus aureus infection, unspecified site: Secondary | ICD-10-CM | POA: Diagnosis present

## 2010-06-05 DIAGNOSIS — M1990 Unspecified osteoarthritis, unspecified site: Secondary | ICD-10-CM | POA: Diagnosis present

## 2010-06-05 DIAGNOSIS — L0390 Cellulitis, unspecified: Secondary | ICD-10-CM

## 2010-06-05 DIAGNOSIS — N179 Acute kidney failure, unspecified: Secondary | ICD-10-CM | POA: Diagnosis present

## 2010-06-05 DIAGNOSIS — L02219 Cutaneous abscess of trunk, unspecified: Principal | ICD-10-CM | POA: Diagnosis present

## 2010-06-05 DIAGNOSIS — F341 Dysthymic disorder: Secondary | ICD-10-CM | POA: Diagnosis present

## 2010-06-05 DIAGNOSIS — N289 Disorder of kidney and ureter, unspecified: Secondary | ICD-10-CM | POA: Diagnosis present

## 2010-06-05 DIAGNOSIS — Q828 Other specified congenital malformations of skin: Secondary | ICD-10-CM

## 2010-06-05 DIAGNOSIS — G4700 Insomnia, unspecified: Secondary | ICD-10-CM | POA: Diagnosis present

## 2010-06-05 DIAGNOSIS — M109 Gout, unspecified: Secondary | ICD-10-CM | POA: Diagnosis present

## 2010-06-05 DIAGNOSIS — I10 Essential (primary) hypertension: Secondary | ICD-10-CM | POA: Diagnosis present

## 2010-06-05 NOTE — Telephone Encounter (Signed)
Call from pt said that the bumps on her body are gushing blood and pus is coming out.  Said that she is just gushing all over everything.  Is on her forehead and back.  Has a big knot on the left hand side of her back.  Has been throwing up.  Unable to keep down very much.  Has to force herself to eat.  Thinks she is having fevers.  Side is hot and red looking infected.  Said that it may be infected.  Pt was advised to go to the ER.  Said tahts she thinks she needs to be admitted.

## 2010-06-05 NOTE — Telephone Encounter (Signed)
OK to go to ER

## 2010-06-06 ENCOUNTER — Ambulatory Visit (HOSPITAL_COMMUNITY): Payer: Medicaid Other

## 2010-06-06 ENCOUNTER — Encounter: Payer: Self-pay | Admitting: Internal Medicine

## 2010-06-06 DIAGNOSIS — L0291 Cutaneous abscess, unspecified: Secondary | ICD-10-CM

## 2010-06-06 DIAGNOSIS — L0390 Cellulitis, unspecified: Secondary | ICD-10-CM

## 2010-06-06 LAB — BASIC METABOLIC PANEL
BUN: 21 mg/dL (ref 6–23)
CO2: 30 mEq/L (ref 19–32)
Chloride: 101 mEq/L (ref 96–112)
Glucose, Bld: 102 mg/dL — ABNORMAL HIGH (ref 70–99)
Potassium: 3.6 mEq/L (ref 3.5–5.1)

## 2010-06-06 LAB — DIFFERENTIAL
Lymphocytes Relative: 12 % (ref 12–46)
Lymphs Abs: 1.9 10*3/uL (ref 0.7–4.0)
Monocytes Relative: 6 % (ref 3–12)
Neutro Abs: 12.5 10*3/uL — ABNORMAL HIGH (ref 1.7–7.7)
Neutrophils Relative %: 80 % — ABNORMAL HIGH (ref 43–77)

## 2010-06-06 LAB — COMPREHENSIVE METABOLIC PANEL
AST: 14 U/L (ref 0–37)
CO2: 29 mEq/L (ref 19–32)
Chloride: 105 mEq/L (ref 96–112)
Creatinine, Ser: 1.36 mg/dL — ABNORMAL HIGH (ref 0.4–1.2)
GFR calc Af Amer: 51 mL/min — ABNORMAL LOW (ref >60)
GFR calc non Af Amer: 42 mL/min — ABNORMAL LOW (ref >60)
Glucose, Bld: 104 mg/dL — ABNORMAL HIGH (ref 70–99)
Total Bilirubin: 0.4 mg/dL (ref 0.3–1.2)

## 2010-06-06 LAB — DRUGS OF ABUSE SCREEN W/O ALC, ROUTINE URINE
Amphetamine Screen, Ur: NEGATIVE
Cocaine Metabolites: NEGATIVE
Phencyclidine (PCP): NEGATIVE
Propoxyphene: NEGATIVE

## 2010-06-06 LAB — CBC
HCT: 37.9 % (ref 36.0–46.0)
Hemoglobin: 12 g/dL (ref 12.0–15.0)
MCV: 90.7 fL (ref 78.0–100.0)
Platelets: 270 10*3/uL (ref 150–400)
RBC: 4.18 MIL/uL (ref 3.87–5.11)
WBC: 15.6 10*3/uL — ABNORMAL HIGH (ref 4.0–10.5)

## 2010-06-06 NOTE — H&P (Signed)
Hospital Admission Note Date: 06/06/2010  Patient name: Kara Vincent Medical record number: [id number] Date of birth: 05-Nov-1965 Age: 45 y.o. Gender: female PCP: PRIBULA,CHRISTOPHER, MD  Medical Service: IM  Attending physician:  Dr. Lane Hacker  Pager: Resident (R2/R3):Dr. Ina Homes    Pager: [phone] Resident (R1):Dr. Bowen    Pager: [phone]  Chief Complaint: cellulitis  History of Present Illness:  Patient is a 45 y/o woman with morbid obesity, HTN, and Darier's disease presenting with persistent lower back cellulitis. Patient was seen and discharged from the ED on 06/03/10 for the same chief complaint, and was discharged home with Doxycycline and Clindamycin, which she was taking for a conjunctivitis. However, she states she's been experiencing increased pain and the lesion on her back had started draining purulent material. She also complains of nausea and one episode of non bloody non bilious emesis, which occurred after eating a fish sandwich. She states that she has been compliant with her oral antibiotics. However, she denied any fever, chills, sweats, chest pain, abd pain, headache, dizziness, lightheadedness, palpitations, changes in bowel habits, dysuria or any other systemic symptoms. Patient has not been seen by a dermatologist for over one year despite many recommendations for her to follow up with them.   Current Outpatient Prescriptions  Medication Sig Dispense Refill  . albuterol (PROVENTIL HFA) 108 (90 BASE) MCG/ACT inhaler Inhale 1 puff into the lungs every 4 (four) hours as needed.        [name] albuterol (PROVENTIL) (2.5 MG/3ML) 0.083% nebulizer solution Take 2.5 mg by nebulization as needed. Not more than 4 times a day. Come to ED if persistent breathing problem       . ALPRAZolam (XANAX) 1 MG tablet Take 1 tablet (1 mg total) by mouth 2 (two) times daily as needed. For anxiety  30 tablet    . amitriptyline (ELAVIL) 75 MG tablet Take 75 mg by mouth at bedtime.        [name]  ammonium lactate (AMLACTIN) 12 % cream Apply topically 2 (two) times daily. In a thin layer on affected area       . clindamycin (CLEOCIN) 150 MG capsule Take 1 capsule (150 mg total) by mouth 4 (four) times daily.  40 capsule  0  . cloNIDine (CATAPRES) 0.2 MG tablet Take 0.2 mg by mouth 2 (two) times daily.        . Emollient (EUCERIN) lotion Apply topically 2 (two) times daily. On affected areas       . Fluticasone-Salmeterol (ADVAIR DISKUS) 100-50 MCG/DOSE AEPB Inhale 1 puff into the lungs 2 (two) times daily.        [name] lisinopril (PRINIVIL,ZESTRIL) 20 MG tablet Take 2 tablets (40 mg total) by mouth daily.  30 tablet  5  . oxyCODONE-acetaminophen (PERCOCET) 5-325 MG per tablet Take 1 tablet by mouth every 6 (six) hours as needed. pain  60 tablet  0  . pantoprazole (PROTONIX) 40 MG tablet Take 40 mg by mouth daily.        . Salicylic Acid (SALEX) 6 % SHAM Apply to scalp 1 -2 times a week       . traZODone (DESYREL) 50 MG tablet Take 50 mg by mouth at bedtime.        . triamcinolone (KENALOG) 0.1 % ointment Apply topically 2 (two) times daily. Apply thin layer over rash       . zolpidem (AMBIEN) 5 MG tablet Take 5 mg by mouth at bedtime.  Allergies: Azithromycin; Ibuprofen; and Sulfamethoxazole w/trimethoprim  Past Medical History  Diagnosis Date  . Asthma   . GERD (gastroesophageal reflux disease)   . Hypertension   . Homelessness   . Low back pain   . 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    No past surgical history on file.  Family History  Problem Relation Age of Onset  . Asthma Mother   . Diabetes Father   . Heart disease Father   . Stroke Father     History   Social History  . Marital Status: Single    Spouse Name: N/A    Number of Children: N/A  . Years of Education: N/A   Occupational History  . Not on file.   Social History Main Topics  . Smoking status: Former Research scientist (life sciences)  . Smokeless tobacco: Former Systems developer    Quit date: 05/27/1978  . Alcohol  Use: No  . Drug Use: No  . Sexually Active: No   Other Topics Concern  . Not on file   Social History Narrative   Patient has exhausted [facility] financial resources. She does have access to [employer] and [employer]. She should be redirected to purse disability with her attorney and reapply of Medicaid    Review of Systems: Pertinent items are noted in HPI.  Physical Exam:  Vitals: t 99.2, bp 127/71, p 96, rr 16, 94% on RA General appearance: cooperative, no distress and morbidly obese Head: Normocephalic, without obvious abnormality, atraumatic Eyes: negative findings: pupils equal, round, reactive to light and accomodation Neck: no carotid bruit, no JVD and supple, symmetrical, trachea midline Lungs: clear to auscultation bilaterally Heart: regular rate and rhythm, S1, S2 normal, no murmur, click, rub or gallop Abdomen: soft, non-tender; bowel sounds normal; no masses,  no organomegaly Extremities: extremities normal, atraumatic, no cyanosis or edema Pulses: 2+ and symmetric Skin: On mid-lower back, there is a large area of erythema and warmth, with a centrally located open lesion that is draining purulent and foul smelling material. She also has multiple small red maculopapular lesions distributed over her trunk, upper and lower extremities, these appear to be at baseline Neurologic: Alert and oriented X 3, normal strength and tone. Normal symmetric reflexes. Normal coordination and gait   Lab results:  WBC                                      15.6       h      4.0-10.5         K/uL  RBC                                      4.18              3.87-5.11        MIL/uL  Hemoglobin (HGB)                         12.0              12.0-15.0        g/dL  Hematocrit (HCT)                         37.9  36.0-46.0        %  MCV                                      90.7              78.0-100.0       fL  MCH -                                    28.7              26.0-34.0         pg  MCHC                                     31.7              30.0-36.0        g/dL  RDW                                      15.4              11.5-15.5        %  Platelet Count (PLT)                     270               150-400          K/uL  Neutrophils, %                           80         h      43-77            %  Lymphocytes, %                           12                12-46            %  Monocytes, %                             6                 3-12             %  Eosinophils, %                           2                 0-5              %  Basophils, %                             0                 0-1              %  Neutrophils, Absolute                    12.5       h      1.7-7.7          K/uL  Lymphocytes, Absolute                    1.9               0.7-4.0          K/uL  Monocytes, Absolute                      0.9               0.1-1.0          K/uL  Eosinophils, Absolute                    0.3               0.0-0.7          K/uL  Basophils, Absolute                      0.0               0.0-0.1          K/uL  Assessment & Plan by Problem:  1. Cellulitis - not responding to doxycycline or clindamycin outpatient. Will culture blood and wound given fever and pus drainage. - will start iv vancomycin - Wound care consult. - percocet for pain, zofran for nausea and vomiting  2. Darier Disease - at her baseline - continue topical moisturizing cream and benadryl prn for itching - will need dermatology follow up outpatient to decide on need for maintenance therapy.  3. HTN - continue lisinopril at home dose.  4. Asthma - advair and albuterol prn  5. Insomnia - trazodone prn  6. DVT Px - lovenox

## 2010-06-07 ENCOUNTER — Encounter: Payer: Medicaid Other | Admitting: Internal Medicine

## 2010-06-07 LAB — URINALYSIS, ROUTINE W REFLEX MICROSCOPIC
Hgb urine dipstick: NEGATIVE
Ketones, ur: NEGATIVE mg/dL
Protein, ur: 30 mg/dL — AB
Urobilinogen, UA: 0.2 mg/dL (ref 0.0–1.0)

## 2010-06-07 LAB — CBC
Hemoglobin: 11.4 g/dL — ABNORMAL LOW (ref 12.0–15.0)
MCH: 28.9 pg (ref 26.0–34.0)
MCV: 91.6 fL (ref 78.0–100.0)
RBC: 3.94 MIL/uL (ref 3.87–5.11)

## 2010-06-07 LAB — URINE MICROSCOPIC-ADD ON

## 2010-06-07 LAB — BASIC METABOLIC PANEL
Calcium: 7.6 mg/dL — ABNORMAL LOW (ref 8.4–10.5)
GFR calc Af Amer: 43 mL/min — ABNORMAL LOW (ref >60)
GFR calc non Af Amer: 35 mL/min — ABNORMAL LOW (ref >60)
Sodium: 139 mEq/L (ref 135–145)

## 2010-06-07 LAB — CREATININE, URINE, RANDOM: Creatinine, Urine: 223 mg/dL

## 2010-06-08 LAB — DIFFERENTIAL
Basophils Absolute: 0 10*3/uL (ref 0.0–0.1)
Basophils Absolute: 0.1 10*3/uL (ref 0.0–0.1)
Basophils Relative: 0 % (ref 0–1)
Basophils Relative: 1 % (ref 0–1)
Eosinophils Relative: 2 % (ref 0–5)
Lymphocytes Relative: 19 % (ref 12–46)
Lymphocytes Relative: 24 % (ref 12–46)
Neutro Abs: 5.5 10*3/uL (ref 1.7–7.7)
Neutro Abs: 6.5 10*3/uL (ref 1.7–7.7)
Neutrophils Relative %: 74 % (ref 43–77)

## 2010-06-08 LAB — URINALYSIS, ROUTINE W REFLEX MICROSCOPIC
Glucose, UA: NEGATIVE mg/dL
Leukocytes, UA: NEGATIVE
Specific Gravity, Urine: 1.027 (ref 1.005–1.030)
pH: 5.5 (ref 5.0–8.0)

## 2010-06-08 LAB — COMPREHENSIVE METABOLIC PANEL
AST: 15 U/L (ref 0–37)
BUN: 14 mg/dL (ref 6–23)
CO2: 32 mEq/L (ref 19–32)
Chloride: 103 mEq/L (ref 96–112)
Creatinine, Ser: 1.45 mg/dL — ABNORMAL HIGH (ref 0.4–1.2)
GFR calc non Af Amer: 39 mL/min — ABNORMAL LOW (ref >60)
Total Bilirubin: 0.7 mg/dL (ref 0.3–1.2)

## 2010-06-08 LAB — BASIC METABOLIC PANEL
BUN: 16 mg/dL (ref 6–23)
BUN: 7 mg/dL (ref 6–23)
Calcium: 8.3 mg/dL — ABNORMAL LOW (ref 8.4–10.5)
Chloride: 103 mEq/L (ref 96–112)
Creatinine, Ser: 1.01 mg/dL (ref 0.4–1.2)
GFR calc non Af Amer: 59 mL/min — ABNORMAL LOW (ref >60)
Potassium: 4.1 mEq/L (ref 3.5–5.1)
Potassium: 4.1 mEq/L (ref 3.5–5.1)
Sodium: 140 mEq/L (ref 135–145)

## 2010-06-08 LAB — CBC
HCT: 38.9 % (ref 36.0–46.0)
HCT: 37.1 % (ref 36.0–46.0)
Hemoglobin: 12.1 g/dL (ref 12.0–15.0)
Hemoglobin: 12.7 g/dL (ref 12.0–15.0)
MCH: 29.5 pg (ref 26.0–34.0)
MCV: 91.5 fL (ref 78.0–100.0)
MCV: 88.3 fL (ref 78.0–100.0)
Platelets: 232 10*3/uL (ref 150–400)
RBC: 4.25 MIL/uL (ref 3.87–5.11)
RDW: 15.6 % — ABNORMAL HIGH (ref 11.5–15.5)
RDW: 14.6 % (ref 11.5–15.5)
WBC: 10.7 10*3/uL — ABNORMAL HIGH (ref 4.0–10.5)
WBC: 8.2 10*3/uL (ref 4.0–10.5)
WBC: 8.8 10*3/uL (ref 4.0–10.5)

## 2010-06-08 LAB — URINE MICROSCOPIC-ADD ON

## 2010-06-08 LAB — WOUND CULTURE

## 2010-06-08 LAB — LIPASE, BLOOD: Lipase: 26 U/L (ref 11–59)

## 2010-06-09 ENCOUNTER — Inpatient Hospital Stay (HOSPITAL_COMMUNITY): Payer: Medicaid Other

## 2010-06-09 LAB — BASIC METABOLIC PANEL
BUN: 19 mg/dL (ref 6–23)
CO2: 31 mEq/L (ref 19–32)
Chloride: 99 mEq/L (ref 96–112)
GFR calc Af Amer: 49 mL/min — ABNORMAL LOW (ref >60)
GFR calc non Af Amer: 52 mL/min — ABNORMAL LOW (ref >60)
Glucose, Bld: 102 mg/dL — ABNORMAL HIGH (ref 70–99)
Glucose, Bld: 110 mg/dL — ABNORMAL HIGH (ref 70–99)
Potassium: 4.3 mEq/L (ref 3.5–5.1)
Potassium: 3.8 mEq/L (ref 3.5–5.1)
Sodium: 143 mEq/L (ref 135–145)
Sodium: 138 mEq/L (ref 135–145)

## 2010-06-09 LAB — CBC
HCT: 34.7 % — ABNORMAL LOW (ref 36.0–46.0)
HCT: 38.4 % (ref 36.0–46.0)
Hemoglobin: 10.5 g/dL — ABNORMAL LOW (ref 12.0–15.0)
Hemoglobin: 13 g/dL (ref 12.0–15.0)
MCHC: 34 g/dL (ref 30.0–36.0)
MCV: 87.8 fL (ref 78.0–100.0)
RBC: 3.7 MIL/uL — ABNORMAL LOW (ref 3.87–5.11)
RDW: 14.1 % (ref 11.5–15.5)
WBC: 7.8 10*3/uL (ref 4.0–10.5)
WBC: 9.7 10*3/uL (ref 4.0–10.5)

## 2010-06-09 LAB — POCT CARDIAC MARKERS
CKMB, poc: <1 ng/mL — ABNORMAL LOW (ref 1.0–8.0)
Troponin i, poc: <0.05 ng/mL (ref 0.00–0.09)

## 2010-06-09 LAB — DIFFERENTIAL
Basophils Absolute: 0 10*3/uL (ref 0.0–0.1)
Eosinophils Relative: 3 % (ref 0–5)
Lymphocytes Relative: 25 % (ref 12–46)
Monocytes Absolute: 0.5 10*3/uL (ref 0.1–1.0)
Monocytes Relative: 5 % (ref 3–12)
Neutro Abs: 6.5 10*3/uL (ref 1.7–7.7)

## 2010-06-09 IMAGING — CR DG KNEE COMPLETE 4+V*R*
4 series · 4 of 4 positions shown · non-contrast
Comparison: 07/14/2007

CLINICAL DATA: Right knee swelling and pain

RIGHT KNEE - COMPLETE 4+ VIEW

[t knee ap right]
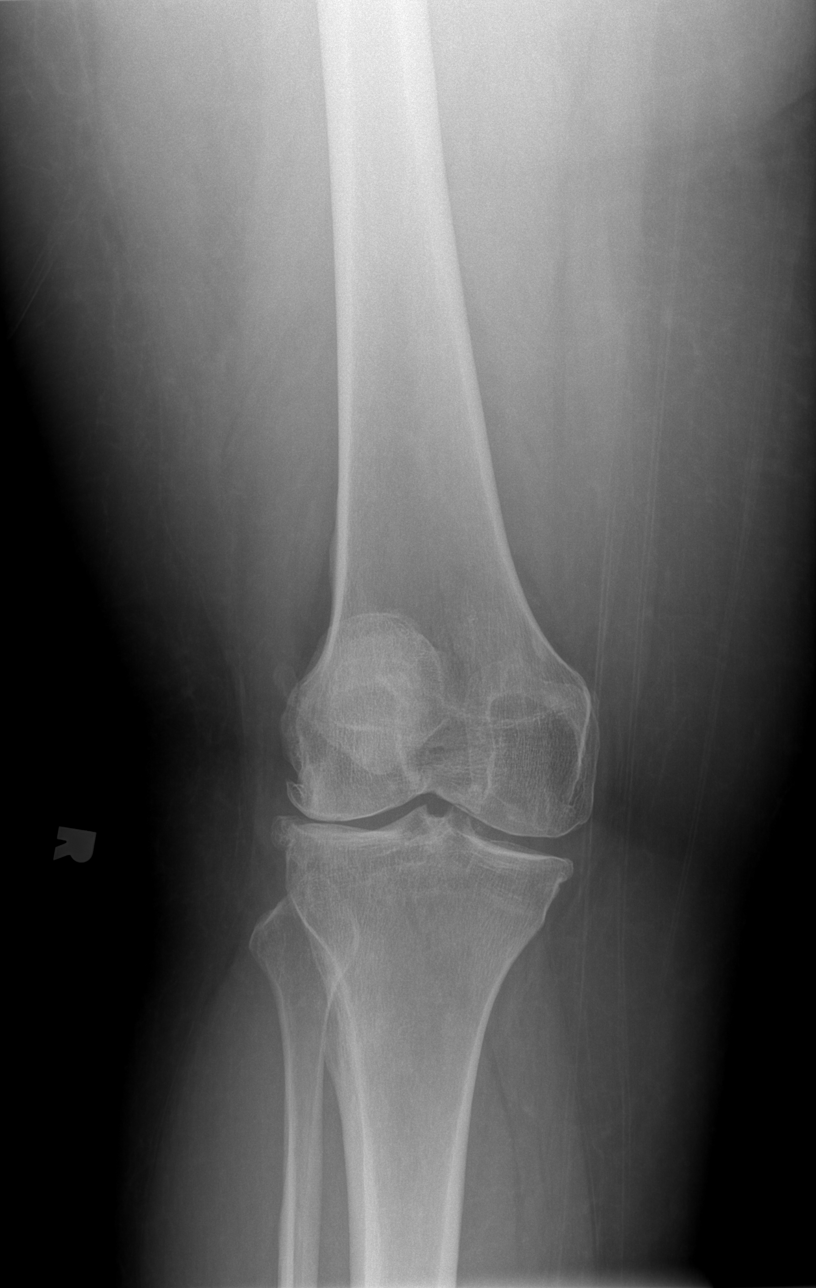

[t knee oblique right (1 of 3)]
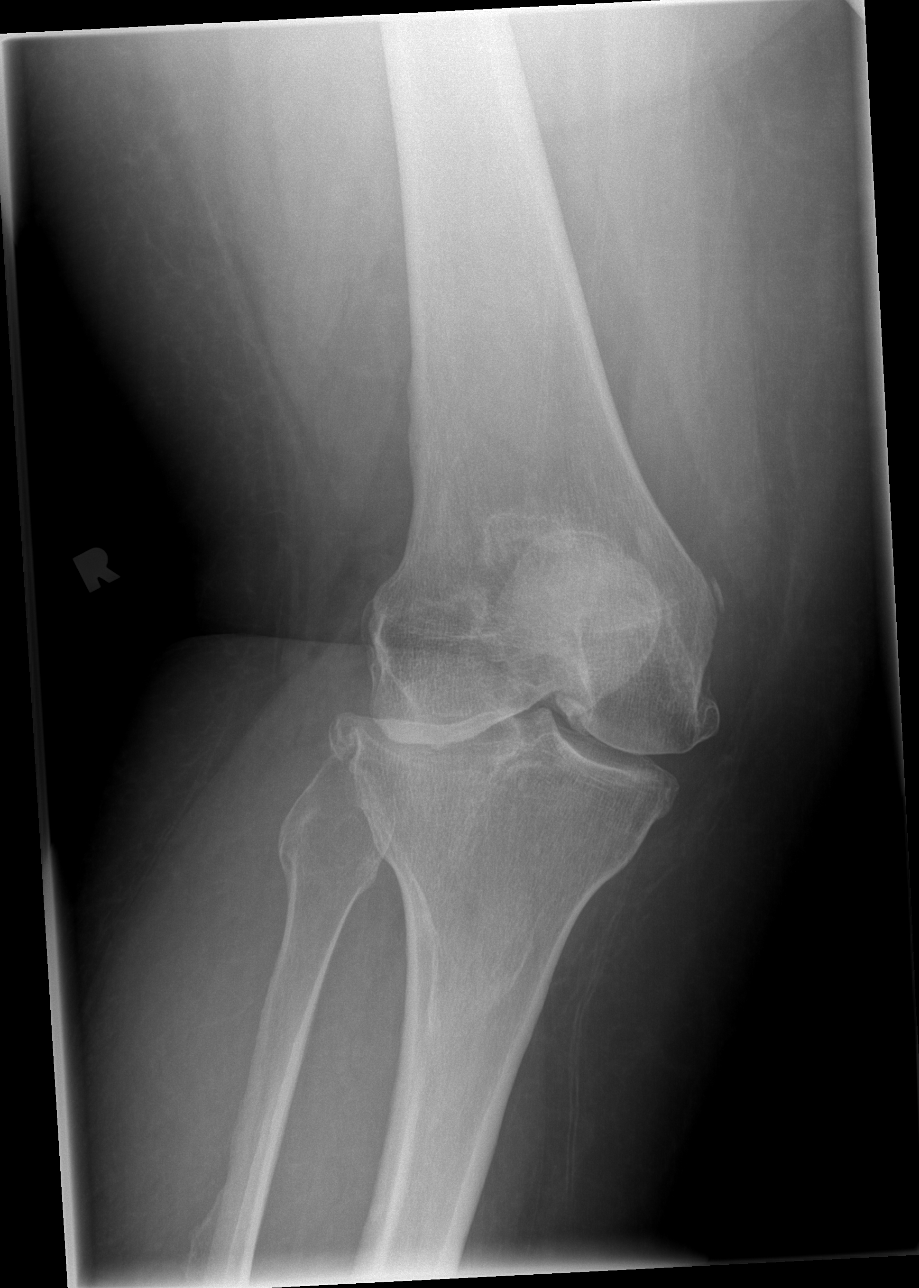

[t knee oblique right (2 of 3)]
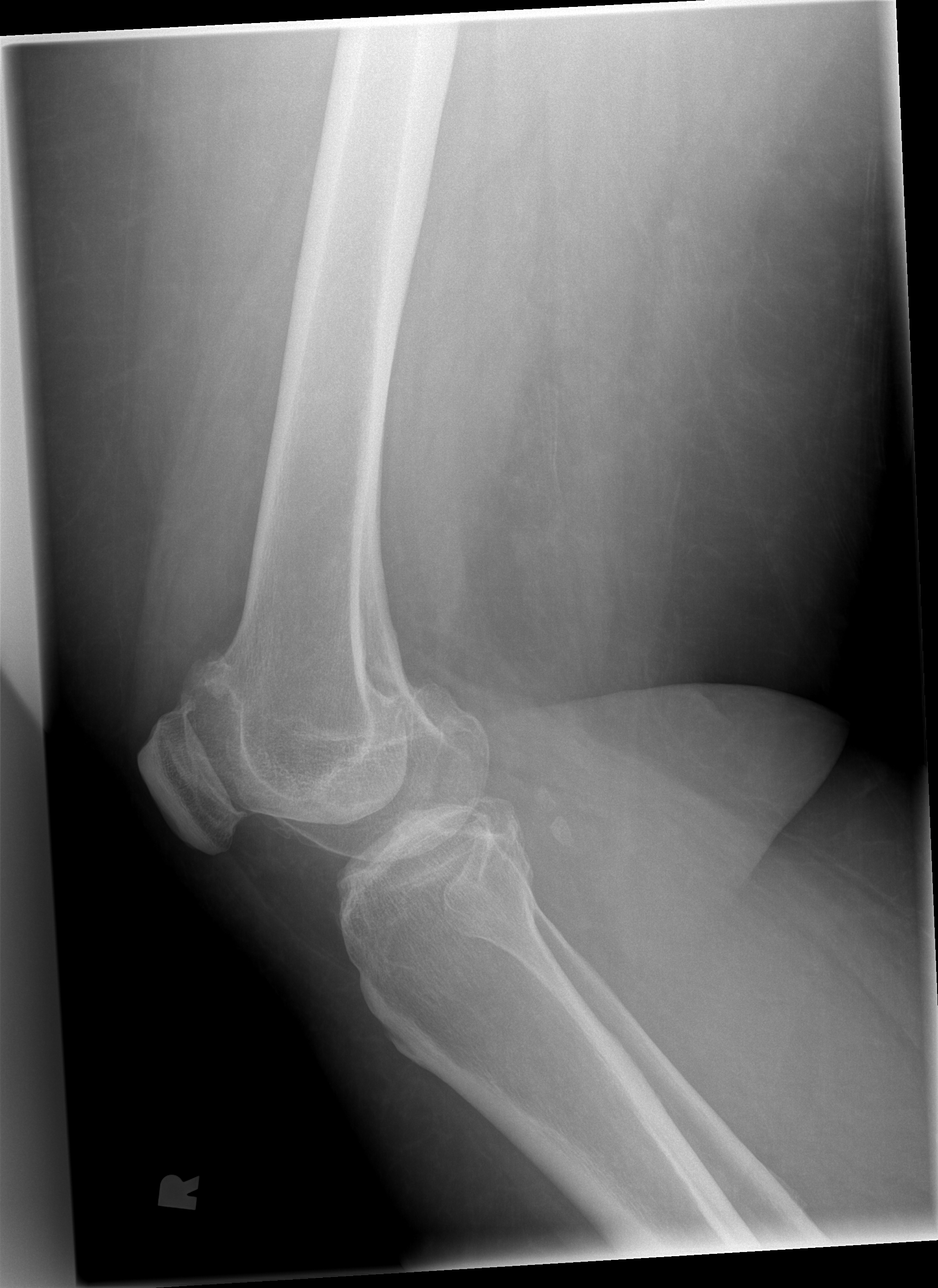

[t knee oblique right (3 of 3)]
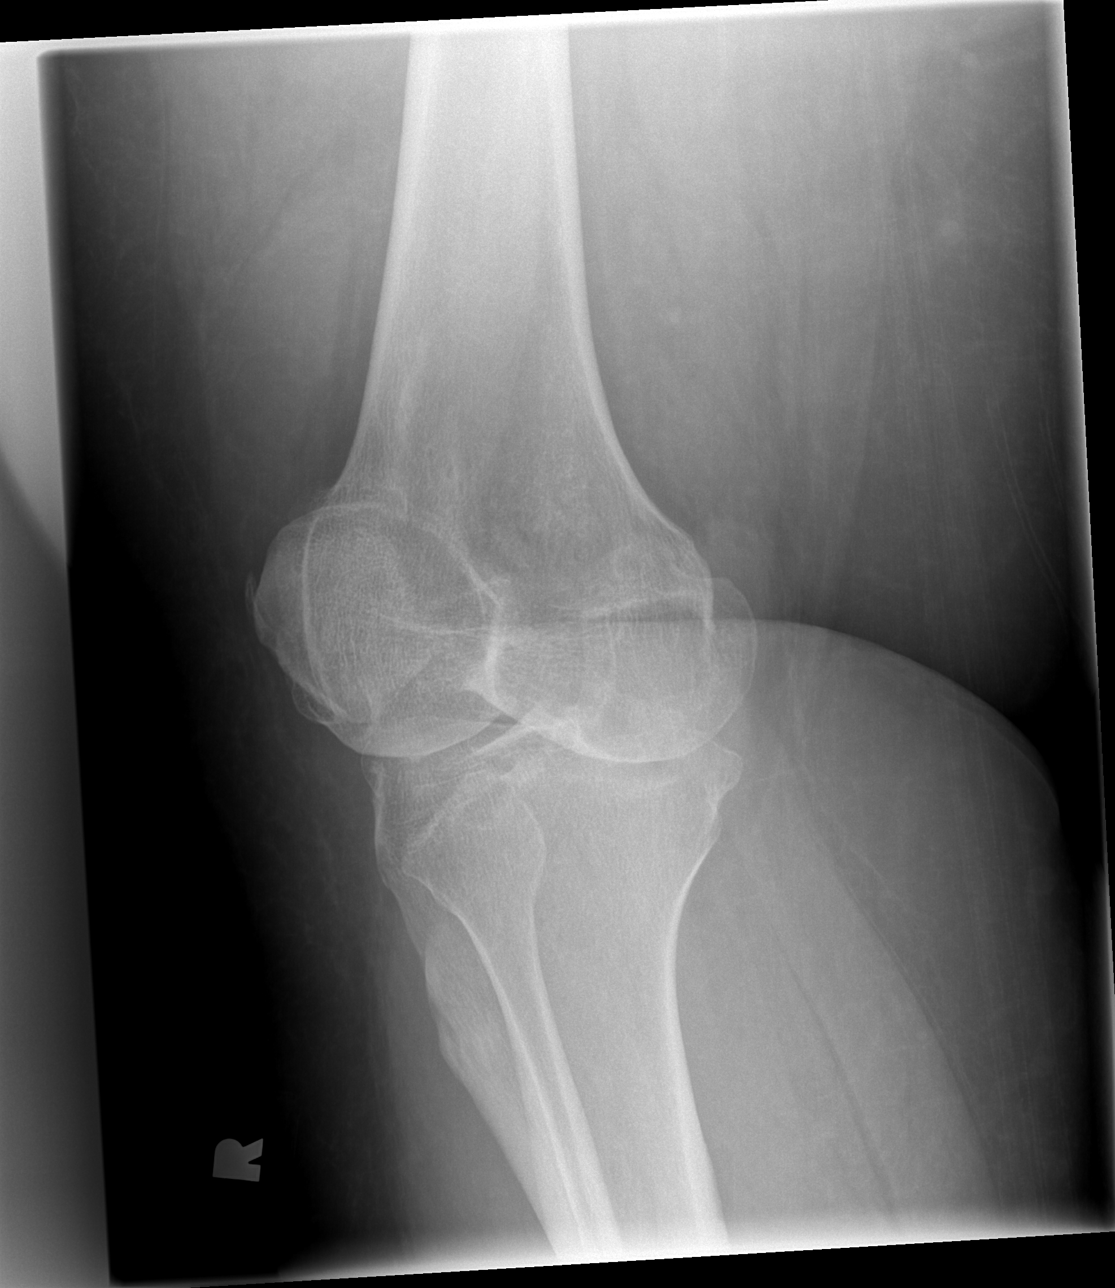

[4 of 4 positions shown; findings below may reference images not displayed]

FINDINGS: Diffuse bony demineralization.
Tricompartmental osteoarthritic changes, greatest at lateral
compartment.
No acute fracture, dislocation, or bone destruction.
No definite knee joint effusion.
Scattered clothing artifacts.
IMPRESSION: Osteoarthritic changes and bony demineralization without acute
abnormality.

## 2010-06-10 LAB — BASIC METABOLIC PANEL
Chloride: 104 mEq/L (ref 96–112)
GFR calc Af Amer: 54 mL/min — ABNORMAL LOW (ref >60)
GFR calc non Af Amer: 44 mL/min — ABNORMAL LOW (ref >60)
Potassium: 4.9 mEq/L (ref 3.5–5.1)
Sodium: 142 mEq/L (ref 135–145)

## 2010-06-10 LAB — WOUND CULTURE: Gram Stain: NONE SEEN

## 2010-06-10 LAB — BRAIN NATRIURETIC PEPTIDE: Pro B Natriuretic peptide (BNP): 129 pg/mL — ABNORMAL HIGH (ref 0.0–100.0)

## 2010-06-11 ENCOUNTER — Inpatient Hospital Stay (HOSPITAL_COMMUNITY): Payer: Medicaid Other

## 2010-06-11 DIAGNOSIS — L0390 Cellulitis, unspecified: Secondary | ICD-10-CM

## 2010-06-11 DIAGNOSIS — L0291 Cutaneous abscess, unspecified: Secondary | ICD-10-CM

## 2010-06-11 LAB — VARICELLA-ZOSTER BY PCR: Varicella-Zoster, PCR: NOT DETECTED

## 2010-06-11 LAB — CULTURE, BLOOD (ROUTINE X 2)

## 2010-06-12 DIAGNOSIS — L0291 Cutaneous abscess, unspecified: Secondary | ICD-10-CM

## 2010-06-12 DIAGNOSIS — L0390 Cellulitis, unspecified: Secondary | ICD-10-CM

## 2010-06-12 LAB — CBC
HCT: 32.9 % — ABNORMAL LOW (ref 36.0–46.0)
HCT: 34.5 % — ABNORMAL LOW (ref 36.0–46.0)
HCT: 38.7 % (ref 36.0–46.0)
Hemoglobin: 11.4 g/dL — ABNORMAL LOW (ref 12.0–15.0)
Hemoglobin: 12.5 g/dL (ref 12.0–15.0)
Hemoglobin: 12.8 g/dL (ref 12.0–15.0)
MCHC: 32.4 g/dL (ref 30.0–36.0)
MCHC: 33.1 g/dL (ref 30.0–36.0)
MCV: 89.2 fL (ref 78.0–100.0)
MCV: 90 fL (ref 78.0–100.0)
MCV: 90.1 fL (ref 78.0–100.0)
Platelets: 266 10*3/uL (ref 150–400)
Platelets: 347 10*3/uL (ref 150–400)
RBC: 4.32 MIL/uL (ref 3.87–5.11)
RDW: 13.9 % (ref 11.5–15.5)
RDW: 14.4 % (ref 11.5–15.5)
WBC: 10.9 10*3/uL — ABNORMAL HIGH (ref 4.0–10.5)
WBC: 13.1 10*3/uL — ABNORMAL HIGH (ref 4.0–10.5)
WBC: 14 10*3/uL — ABNORMAL HIGH (ref 4.0–10.5)

## 2010-06-12 LAB — BASIC METABOLIC PANEL
BUN: 13 mg/dL (ref 6–23)
BUN: 16 mg/dL (ref 6–23)
CO2: 33 mEq/L — ABNORMAL HIGH (ref 19–32)
CO2: 33 mEq/L — ABNORMAL HIGH (ref 19–32)
Calcium: 8.5 mg/dL (ref 8.4–10.5)
Chloride: 104 mEq/L (ref 96–112)
Chloride: 106 mEq/L (ref 96–112)
Creatinine, Ser: 1.32 mg/dL — ABNORMAL HIGH (ref 0.4–1.2)
Creatinine, Ser: 1.14 mg/dL (ref 0.4–1.2)
GFR calc Af Amer: 52 mL/min — ABNORMAL LOW (ref >60)
GFR calc non Af Amer: 43 mL/min — ABNORMAL LOW (ref >60)
GFR calc non Af Amer: 54 mL/min — ABNORMAL LOW (ref >60)
Glucose, Bld: 107 mg/dL — ABNORMAL HIGH (ref 70–99)
Glucose, Bld: 97 mg/dL (ref 70–99)
Potassium: 4 mEq/L (ref 3.5–5.1)
Potassium: 4.1 mEq/L (ref 3.5–5.1)
Sodium: 138 mEq/L (ref 135–145)
Sodium: 139 mEq/L (ref 135–145)

## 2010-06-12 LAB — DIFFERENTIAL
Basophils Relative: 0 % (ref 0–1)
Eosinophils Absolute: 0.1 10*3/uL (ref 0.0–0.7)
Eosinophils Relative: 1 % (ref 0–5)
Lymphocytes Relative: 16 % (ref 12–46)
Lymphocytes Relative: 24 % (ref 12–46)
Lymphocytes Relative: 9 % — ABNORMAL LOW (ref 12–46)
Lymphs Abs: 3.3 10*3/uL (ref 0.7–4.0)
Monocytes Absolute: 0.5 10*3/uL (ref 0.1–1.0)
Monocytes Absolute: 0.6 10*3/uL (ref 0.1–1.0)
Monocytes Relative: 4 % (ref 3–12)
Monocytes Relative: 5 % (ref 3–12)
Neutro Abs: 11.3 10*3/uL — ABNORMAL HIGH (ref 1.7–7.7)
Neutro Abs: 8.3 10*3/uL — ABNORMAL HIGH (ref 1.7–7.7)
Neutrophils Relative %: 76 % (ref 43–77)
Neutrophils Relative %: 86 % — ABNORMAL HIGH (ref 43–77)

## 2010-06-12 LAB — COMPREHENSIVE METABOLIC PANEL
Alkaline Phosphatase: 52 U/L (ref 39–117)
BUN: 14 mg/dL (ref 6–23)
Chloride: 106 mEq/L (ref 96–112)
Glucose, Bld: 145 mg/dL — ABNORMAL HIGH (ref 70–99)
Potassium: 3.8 mEq/L (ref 3.5–5.1)
Total Bilirubin: 0.4 mg/dL (ref 0.3–1.2)

## 2010-06-12 LAB — SEDIMENTATION RATE: Sed Rate: 48 mm/hr — ABNORMAL HIGH (ref 0–22)

## 2010-06-12 LAB — POCT I-STAT, CHEM 8
BUN: 12 mg/dL (ref 6–23)
Chloride: 102 mEq/L (ref 96–112)
Glucose, Bld: 109 mg/dL — ABNORMAL HIGH (ref 70–99)
HCT: 39 % (ref 36.0–46.0)
Potassium: 3.4 mEq/L — ABNORMAL LOW (ref 3.5–5.1)

## 2010-06-12 LAB — CULTURE, BLOOD (ROUTINE X 2)

## 2010-06-13 LAB — HSV PCR: HSV 2 , PCR: NOT DETECTED

## 2010-06-14 ENCOUNTER — Telehealth: Payer: Self-pay | Admitting: *Deleted

## 2010-06-14 NOTE — Telephone Encounter (Signed)
Kara Vincent, hhn, called to say that the zyvox ordered by dr Lucia Gaskins had some interactions and she had to let pt's md know, she has called dr Lucia Gaskins and talked w/ his office. Pt is stable, no reactions, denies any new problems. HHN is encouraged to call back if new problems do arise or call 911, she is agreeable and has given pt same instructions.

## 2010-06-14 NOTE — Telephone Encounter (Signed)
Pt went to the ER.

## 2010-06-14 NOTE — Telephone Encounter (Signed)
I was on the inpatient team taking care of her so I was aware of the Zyvox.  We don't have lot of other choices so encourage them to keep a close eye for interactions.

## 2010-06-17 ENCOUNTER — Other Ambulatory Visit: Payer: Self-pay | Admitting: *Deleted

## 2010-06-17 NOTE — Telephone Encounter (Signed)
Last refill 3/2 but pt only received # 30   She takes bid

## 2010-06-17 NOTE — Op Note (Signed)
  NAMEMarland Vincent  [name], [name]             ACCOUNT NO.:  [id number]  MEDICAL RECORD NO.:  [id number]           PATIENT TYPE:  I  LOCATION:  [id number]                         FACILITY:  [facility]  PHYSICIAN:  Fenton Malling. Lucia Gaskins, M.D.  DATE OF BIRTH:  07-27-1965  DATE OF PROCEDURE:  06/07/2010                              OPERATIVE REPORT   PREOPERATIVE DIAGNOSIS:  Left back abscess.  POSTOPERATIVE DIAGNOSIS:  Left back abscess 100 plus mL of pus.  PROCEDURE:  Incision and drainage of left back access.  SURGEON:  Fenton Malling. Lucia Gaskins, MD  ANESTHESIA:  30 mL of 1% Xylocaine.  COMPLICATIONS:  None.  INDICATIONS FOR PROCEDURE:  Ms. 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  I discussed with the patient about doing an incision and drainage at bedside and I discussed indications and potential complications.  OPERATIVE NOTE: With the patient on her right lateral decubitus position, the left back was prepped with Betadine solution and sterilely draped.  I painted the area with Betadine.  I infiltrated about 30 mL of 1% Xylocaine.  I made an incision into a surprisingly large abscess and had more than 100 mL of pus.  I extended this incision to about 7-8 cm in length.  I packed it with Betadine soaked Kerlix gauze and sterilely dressed it. We will start dressing changes at 3 times a day initially.   Fenton Malling. Lucia Gaskins, M.D., FACS   DHN/MEDQ  D:  06/07/2010  T:  06/08/2010  Job:  [id number]  cc:   Acquanetta Chain, D.O.  Electronically Signed by Alphonsa Overall M.D. on 06/17/2010 02:47:47 PM

## 2010-06-18 MED ORDER — ALPRAZOLAM 1 MG PO TABS: 1.0000 mg | ORAL_TABLET | Freq: Two times a day (BID) | ORAL | Status: DC | PRN

## 2010-06-18 NOTE — Telephone Encounter (Signed)
Rx called in 

## 2010-06-27 ENCOUNTER — Ambulatory Visit: Payer: Medicaid Other | Admitting: Internal Medicine

## 2010-06-30 LAB — CBC
HCT: 37.8 % (ref 36.0–46.0)
HCT: 41.9 % (ref 36.0–46.0)
Hemoglobin: 12 g/dL (ref 12.0–15.0)
Hemoglobin: 12.8 g/dL (ref 12.0–15.0)
MCHC: 33.8 g/dL (ref 30.0–36.0)
MCHC: 34 g/dL (ref 30.0–36.0)
MCHC: 34 g/dL (ref 30.0–36.0)
MCV: 87.7 fL (ref 78.0–100.0)
MCV: 88.6 fL (ref 78.0–100.0)
Platelets: 243 10*3/uL (ref 150–400)
RBC: 4.26 MIL/uL (ref 3.87–5.11)
RDW: 14.3 % (ref 11.5–15.5)
RDW: 14.4 % (ref 11.5–15.5)
WBC: 10.3 10*3/uL (ref 4.0–10.5)

## 2010-06-30 LAB — BASIC METABOLIC PANEL
BUN: 18 mg/dL (ref 6–23)
BUN: 19 mg/dL (ref 6–23)
CO2: 28 mEq/L (ref 19–32)
CO2: 30 mEq/L (ref 19–32)
Calcium: 8.1 mg/dL — ABNORMAL LOW (ref 8.4–10.5)
Calcium: 8.8 mg/dL (ref 8.4–10.5)
Chloride: 104 mEq/L (ref 96–112)
Creatinine, Ser: 1.2 mg/dL (ref 0.4–1.2)
GFR calc Af Amer: >60 mL/min (ref >60)
GFR calc non Af Amer: 49 mL/min — ABNORMAL LOW (ref >60)
Glucose, Bld: 200 mg/dL — ABNORMAL HIGH (ref 70–99)
Glucose, Bld: 91 mg/dL (ref 70–99)
Potassium: 4.5 mEq/L (ref 3.5–5.1)
Sodium: 137 mEq/L (ref 135–145)
Sodium: 138 mEq/L (ref 135–145)
Sodium: 143 mEq/L (ref 135–145)

## 2010-06-30 LAB — TSH: TSH: 0.657 u[IU]/mL (ref 0.350–4.500)

## 2010-06-30 LAB — COMPREHENSIVE METABOLIC PANEL
Albumin: 3.7 g/dL (ref 3.5–5.2)
BUN: 16 mg/dL (ref 6–23)
Calcium: 9.1 mg/dL (ref 8.4–10.5)
Chloride: 102 mEq/L (ref 96–112)
Creatinine, Ser: 1.24 mg/dL — ABNORMAL HIGH (ref 0.4–1.2)
GFR calc Af Amer: 57 mL/min — ABNORMAL LOW (ref >60)
Total Bilirubin: 0.5 mg/dL (ref 0.3–1.2)

## 2010-06-30 LAB — EXPECTORATED SPUTUM ASSESSMENT W GRAM STAIN, RFLX TO RESP C

## 2010-06-30 LAB — MAGNESIUM: Magnesium: 2 mg/dL (ref 1.5–2.5)

## 2010-06-30 LAB — PHOSPHORUS: Phosphorus: 2.9 mg/dL (ref 2.3–4.6)

## 2010-06-30 LAB — LEGIONELLA ANTIGEN, URINE: Legionella Antigen, Urine: NEGATIVE

## 2010-07-01 ENCOUNTER — Ambulatory Visit (INDEPENDENT_AMBULATORY_CARE_PROVIDER_SITE_OTHER): Payer: Medicaid Other | Admitting: Internal Medicine

## 2010-07-01 ENCOUNTER — Ambulatory Visit: Payer: Medicaid Other | Admitting: Internal Medicine

## 2010-07-01 VITALS — BP 134/96 | HR 96 | Temp 97.6°F | Ht 65.0 in | Wt >= 6400 oz

## 2010-07-01 DIAGNOSIS — L02219 Cutaneous abscess of trunk, unspecified: Secondary | ICD-10-CM

## 2010-07-01 DIAGNOSIS — L02212 Cutaneous abscess of back [any part, except buttock]: Secondary | ICD-10-CM

## 2010-07-01 LAB — COMPREHENSIVE METABOLIC PANEL
Alkaline Phosphatase: 54 U/L (ref 39–117)
BUN: 11 mg/dL (ref 6–23)
CO2: 30 mEq/L (ref 19–32)
Chloride: 106 mEq/L (ref 96–112)
GFR calc non Af Amer: >60 mL/min (ref >60)
Glucose, Bld: 138 mg/dL — ABNORMAL HIGH (ref 70–99)
Potassium: 3.8 mEq/L (ref 3.5–5.1)
Total Bilirubin: 0.4 mg/dL (ref 0.3–1.2)

## 2010-07-01 LAB — URINALYSIS, ROUTINE W REFLEX MICROSCOPIC
Glucose, UA: NEGATIVE mg/dL
Hgb urine dipstick: NEGATIVE
pH: 7 (ref 5.0–8.0)

## 2010-07-01 LAB — BASIC METABOLIC PANEL
BUN: 12 mg/dL (ref 6–23)
Calcium: 8.4 mg/dL (ref 8.4–10.5)
Calcium: 8.5 mg/dL (ref 8.4–10.5)
Chloride: 108 mEq/L (ref 96–112)
Creatinine, Ser: 0.94 mg/dL (ref 0.4–1.2)
GFR calc Af Amer: >60 mL/min (ref >60)
GFR calc Af Amer: >60 mL/min (ref >60)
GFR calc non Af Amer: >60 mL/min (ref >60)
Glucose, Bld: 134 mg/dL — ABNORMAL HIGH (ref 70–99)
Sodium: 139 mEq/L (ref 135–145)

## 2010-07-01 LAB — CBC
HCT: 38 % (ref 36.0–46.0)
Hemoglobin: 12.8 g/dL (ref 12.0–15.0)
Hemoglobin: 12.8 g/dL (ref 12.0–15.0)
MCV: 86.6 fL (ref 78.0–100.0)
Platelets: 241 10*3/uL (ref 150–400)
RBC: 4.29 MIL/uL (ref 3.87–5.11)
RDW: 15.1 % (ref 11.5–15.5)
WBC: 10 10*3/uL (ref 4.0–10.5)
WBC: 7.3 10*3/uL (ref 4.0–10.5)

## 2010-07-01 LAB — DIFFERENTIAL
Basophils Absolute: 0 10*3/uL (ref 0.0–0.1)
Basophils Absolute: 0.1 10*3/uL (ref 0.0–0.1)
Basophils Relative: 0 % (ref 0–1)
Basophils Relative: 0 % (ref 0–1)
Eosinophils Absolute: 0.3 10*3/uL (ref 0.0–0.7)
Lymphocytes Relative: 9 % — ABNORMAL LOW (ref 12–46)
Lymphs Abs: 1.4 10*3/uL (ref 0.7–4.0)
Monocytes Absolute: 0.2 10*3/uL (ref 0.1–1.0)
Monocytes Absolute: 0.4 10*3/uL (ref 0.1–1.0)
Monocytes Relative: 5 % (ref 3–12)
Neutro Abs: 5.1 10*3/uL (ref 1.7–7.7)
Neutro Abs: 8.8 10*3/uL — ABNORMAL HIGH (ref 1.7–7.7)
Neutro Abs: 9.9 10*3/uL — ABNORMAL HIGH (ref 1.7–7.7)
Neutrophils Relative %: 71 % (ref 43–77)
Neutrophils Relative %: 88 % — ABNORMAL HIGH (ref 43–77)

## 2010-07-01 LAB — RPR: RPR Ser Ql: NONREACTIVE

## 2010-07-01 MED ORDER — ALLOPURINOL 100 MG PO TABS: 100.0000 mg | ORAL_TABLET | Freq: Every day | ORAL | Status: DC

## 2010-07-01 MED ORDER — LINEZOLID 100 MG/5ML PO SUSR: 600.0000 mg | Freq: Two times a day (BID) | ORAL | Status: DC

## 2010-07-01 MED ORDER — OXYCODONE-ACETAMINOPHEN 10-325 MG PO TABS: 1.0000 | ORAL_TABLET | Freq: Three times a day (TID) | ORAL | Status: DC | PRN

## 2010-07-01 MED ORDER — PANTOPRAZOLE SODIUM 40 MG PO TBEC: 40.0000 mg | DELAYED_RELEASE_TABLET | Freq: Every day | ORAL | Status: DC

## 2010-07-01 MED ORDER — LISINOPRIL 20 MG PO TABS: 40.0000 mg | ORAL_TABLET | Freq: Every day | ORAL | Status: DC

## 2010-07-01 MED ORDER — OXYCODONE-ACETAMINOPHEN 5-325 MG PO TABS: 1.0000 | ORAL_TABLET | Freq: Four times a day (QID) | ORAL | Status: DC | PRN

## 2010-07-01 NOTE — Progress Notes (Signed)
  Subjective:    Patient ID: Arnoldo Lenis, female    DOB: 1965/06/02, 45 y.o.   MRN: [id number]  HPI  Ms. Sipe here on follow up after hospitalization for I&D of back abscess-this required large incision of the abscess which is healing by secondary intention (see below). No change in wound over the last week or so. 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 She is here to have that evaluated, and needs some assistance with medication refills (for percocet, gout, GERD)   Review of Systems     Objective:   Physical Exam  Musculoskeletal:       Back:   Very morbidly obese woman, very deconditioned, Alert Oriented x 3 Has numerous mac-papular lesions on back Site of I&D looks clean, no drainage, slight induration at medial wound margin        Assessment & Plan:  S/P I&D for localized skin abscess, with mild residual induration at wound margin, non-fluctuant.  As a precaution, extend Zyvox BID for 7 days. Home Care RN's are visiting to manage wound  Advised to restart lisinopril, and we will recheck creatinine and Potassium in 2 weeks

## 2010-07-02 ENCOUNTER — Other Ambulatory Visit: Payer: Self-pay | Admitting: Dermatology

## 2010-07-03 ENCOUNTER — Other Ambulatory Visit: Payer: Self-pay | Admitting: *Deleted

## 2010-07-03 DIAGNOSIS — L02212 Cutaneous abscess of back [any part, except buttock]: Secondary | ICD-10-CM

## 2010-07-03 LAB — URINALYSIS, ROUTINE W REFLEX MICROSCOPIC
Bilirubin Urine: NEGATIVE
Nitrite: NEGATIVE
Specific Gravity, Urine: 1.025 (ref 1.005–1.030)
pH: 7 (ref 5.0–8.0)

## 2010-07-03 NOTE — Telephone Encounter (Signed)
Call from pt's [facility] Nurse.  Pt will not have enough of the Zyvox to last for the 7 day treatment.  Pt will need another prescription to complete the 7 day treatment.  Pharmacy is [facility] [phone].  Nurse is going to call Dr. Dahlia Bailiff office to make an appointment.  Pt said that she does not have a follow up appointment with him.

## 2010-07-04 LAB — BASIC METABOLIC PANEL
CO2: 30 mEq/L (ref 19–32)
CO2: 31 mEq/L (ref 19–32)
Calcium: 8.3 mg/dL — ABNORMAL LOW (ref 8.4–10.5)
Chloride: 103 mEq/L (ref 96–112)
Creatinine, Ser: 1.05 mg/dL (ref 0.4–1.2)
Creatinine, Ser: 1.06 mg/dL (ref 0.4–1.2)
GFR calc Af Amer: >60 mL/min (ref >60)
Glucose, Bld: 99 mg/dL (ref 70–99)
Potassium: 4 mEq/L (ref 3.5–5.1)

## 2010-07-04 LAB — URINALYSIS, ROUTINE W REFLEX MICROSCOPIC
Hgb urine dipstick: NEGATIVE
Ketones, ur: NEGATIVE mg/dL
Nitrite: NEGATIVE
Protein, ur: NEGATIVE mg/dL
Specific Gravity, Urine: 1.034 — ABNORMAL HIGH (ref 1.005–1.030)
Urobilinogen, UA: 1 mg/dL (ref 0.0–1.0)
Urobilinogen, UA: 1 mg/dL (ref 0.0–1.0)

## 2010-07-04 LAB — CBC
Hemoglobin: 12.1 g/dL (ref 12.0–15.0)
MCHC: 33.1 g/dL (ref 30.0–36.0)
MCV: 87.4 fL (ref 78.0–100.0)
RDW: 15.4 % (ref 11.5–15.5)

## 2010-07-04 LAB — URINE MICROSCOPIC-ADD ON

## 2010-07-04 NOTE — Consult Note (Signed)
NAMEMarland Vincent  DAMYIAH, HACKEL             ACCOUNT NO.:  [id number]  MEDICAL RECORD NO.:  [id number]           PATIENT TYPE:  I  LOCATION:  [id number]                         FACILITY:  [facility] [place]  PHYSICIAN:  Fenton Malling. Lucia Gaskins, M.D.  DATE OF BIRTH:  1966/03/18  DATE OF CONSULTATION:  06/07/2010                                CONSULTATION   REFERRING PHYSICIAN:  Dr. Ina Homes.  PRIMARY CARE:  None.  REASON FOR CONSULTATION:  Debridement of abscess.  BRIEF HISTORY:  The patient is a 45 year old white female who was seen in the ED on June 03, 2010 with cellulitis of lower back, placed on doxycycline and clindamycin.  She is now admitted on June 05, 2010, with multiple abscesses in infected sites.  We are asked to see her in consultation for debridement of an area on her back which is particularly bad.  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  She has not seen her dermatologist for some time.  There is one area on her back which is fairly large and indurated and is open and draining.  Cultures at this point from multiple sites grew out Staph aureus.  PAST MEDICAL HISTORY: 1. Darier disease. 2. Hypertension. 3. Asthma. 4. Osteoarthritis.  She uses an [employer] for mobility. 5. Obesity.  She weighs 189 kg.  Height is 65 inches.  BMI is 76.2. 6. Chronic urinary urge incontinence. 7. Renal insufficiency. 8. History of conjunctivitis. 9. Anxiety and depression. 10.Obstructive sleep apnea with CPAP.  She uses that approximately 50%     of the time. 11.Gout.  PAST SURGICAL HISTORY:  She has had 2 C-sections.  FAMILY HISTORY:  Mother died in her 38s with asthma.  Father is living with diabetes, coronary artery disease, and stroke.  She has one brother with asthma and high blood pressure; two sisters, both with asthma and high blood pressure.  SOCIAL HISTORY:  She says she only smoked about a year.  Alcohol, questionable at the same time.  Drugs:  She says  she used marijuana and cocaine in the past, but has discontinued.  She is single.  REVIEW OF SYSTEMS:   CV:  Positive for migraines.  No stroke or seizures. SKIN:  She has multiple skin lesions as noted above.  Fever:  She is not aware of any fever.  She does report being having sweats.   PSYCH:  No acute changes.   PULMONARY:  Positive orthopnea.  Positive for dyspnea on exertion.  She does wheeze.  No recent URI that she is aware of. CARDIAC:  Positive for occasional chest pain, positive for some shortness of breath, but it is usually associated with her asthma, not as much of the chest pain.   GI:  Positive for GERD, positive for nausea and vomiting.  No diarrhea or constipation.  No blood in her stool which she is aware of.   GU:  She says currently she voiding well.   LOWER EXTREMITIES:  Positive for edema.  She has ankle pain, when she does walk she reports arthritis in both legs.  MEDICATIONS:  Admission meds include Proventil, alprazolam,  Elavil, Cleocin, clonidine, Advair, lisinopril, Protonix, trazodone, and Ambien.  CURRENT MEDICATIONS: 1. Allopurinol 100 mg daily. 2. Elavil 75 mg nightly. 3. Clonidine 0.2 mg p.o. b.i.d. 4. Darifenacin 7.5 mg daily. 5. Doxycycline 100 mg daily. 6. Fluconazole, Solu-Medrol (Advair 1 puff b.i.d.). 7. Lisinopril 40 mg daily. 8. Magic mouthwash 10 mg t.i.d. 9. Protonix 40 mg daily. 10.Trazodone 50 mg nightly and vancomycin protocol. 11.She is also receiving oxycodone p.r.n.  ALLERGIES:  AZITHROMYCIN, IBUPROFEN, and SEPTRA all cause itchy and rash.  PHYSICAL EXAMINATION:  GENERAL:  This is an obese white female in no acute distress, lying on her side. VITAL SIGNS:  Her height is 65 inches, weight is 189 kg, BMI calculates at 76.  Temperature is 98.6, heart rate is 86, blood pressure 126/72, respiratory rate is 20, and sats 94% on room air. HEAD:  She has an area in the left forehead within the hairline that is open, slightly abscess  with scabbing and crusting at the base.  EARS, NOSE, THROAT AND MOUTH:  Grossly within normal limits. NECK:  No bruits.  No JVD. CHEST:  Nontender.  Trachea is in the midline.  Thyroid is nonpalpable. No JVD or thyromegaly.  Chest is clear to auscultation.  Currently there is no wheezing or rales. CARDIAC:  Normal S1 and S2.  No murmurs were heard. ABDOMEN:  Soft, nontender, nondistended, morbidly obese.  No hernias, masses, or abscesses noted. BACK:  She has an open area which is about half a centimeter or less in diameter.  Surrounded by 4 x 3 area that is hard and indurated, and erythematous.  It is not particularly tender on palpation.  On compression, no purulence was expressed.  She has multiple other sites on her back and her side similar to her forehead with crusting and some discharge. NEUROLOGIC:  No focal changes noted.  Cranial nerves II-XII are grossly normal. PSYCH:  She is alert, oriented, and cooperative.  Urine sodium is 117 and creatinine is 223.  Wound culture are growing Staph aureus.  Sodium is 139, potassium is 3.7, chloride is 104, CO2 is 29, BUN is 17, creatinine is 1.57, glucose is 118, white count is 11.8, hemoglobin 11, hematocrit 36, and platelet 254,000.  Drug screen was negative.  DIAGNOSTICS:  None.  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 2. Hypertension.  Morbid obesity with a BMI of 76. 3. Asthma. 4. Osteoarthritis. 5. Chronic urinary incontinence 6. Renal insufficiency. 7. Recent conjunctivitis. 8. Anxiety and depression. 9. Obstructive sleep apnea.  She uses her CPAP about 50% of the time. 10.Gout.  PLAN:  I agree with the antibiotics.  We will discuss with I and D with Dr. Lucia Gaskins.  I think that is based on her multiple problems, anesthesia would be a major risk.  Dr. Lucia Gaskins will most likely try to do at the bedside I&D under local.  [Dr. Lucia Gaskins will do I&D at  bedside.]   Lydia Guiles, P.A.  Alphonsa Overall, M.D., FACS   WDJ/MEDQ  D:  06/07/2010  T:  06/08/2010  Job:  [id number]  Electronically Signed by Earnstine Regal P.A. on 06/22/2010 10:08:45 AM Electronically Signed by Alphonsa Overall M.D. on 07/04/2010 10:40:56 AM

## 2010-07-05 ENCOUNTER — Telehealth: Payer: Self-pay | Admitting: *Deleted

## 2010-07-05 MED ORDER — LINEZOLID 100 MG/5ML PO SUSR: 600.0000 mg | Freq: Two times a day (BID) | ORAL | Status: DC

## 2010-07-05 MED ORDER — LINEZOLID 600 MG PO TABS: 600.0000 mg | ORAL_TABLET | Freq: Two times a day (BID) | ORAL | Status: AC

## 2010-07-05 NOTE — Telephone Encounter (Signed)
Noted! Thanks for the help! 

## 2010-07-05 NOTE — Telephone Encounter (Signed)
OK to approve as outlined

## 2010-07-05 NOTE — Telephone Encounter (Addendum)
[facility], calls to say zyvox needs to be in pill form, pharm will need to order liquid and pt finishes the zyvox liq today and will need not to wait for the new liq to come in, may we send and order for tablets or pills.  Thanks, h.  Review of Dr. Ronnell Freshwater note on 07/01/2010 suggests that the plan was to continue the Zyvox for an additional 7 days from 07/03/2010 (until 07/10/2010).  Please contact [facility] to let them know tablets have been ordered to complete course.  Thanks.

## 2010-07-08 LAB — CARDIAC PANEL(CRET KIN+CKTOT+MB+TROPI)
CK, MB: 1.5 ng/mL (ref 0.3–4.0)
Relative Index: INVALID (ref 0.0–2.5)
Relative Index: INVALID (ref 0.0–2.5)
Troponin I: 0.01 ng/mL (ref 0.00–0.06)
Troponin I: 0.02 ng/mL (ref 0.00–0.06)

## 2010-07-08 LAB — CBC
HCT: 34.5 % — ABNORMAL LOW (ref 36.0–46.0)
HCT: 35.9 % — ABNORMAL LOW (ref 36.0–46.0)
HCT: 36.3 % (ref 36.0–46.0)
HCT: 36.7 % (ref 36.0–46.0)
Hemoglobin: 11.2 g/dL — ABNORMAL LOW (ref 12.0–15.0)
Hemoglobin: 11.8 g/dL — ABNORMAL LOW (ref 12.0–15.0)
Hemoglobin: 11.9 g/dL — ABNORMAL LOW (ref 12.0–15.0)
Hemoglobin: 12.2 g/dL (ref 12.0–15.0)
Hemoglobin: 12.2 g/dL (ref 12.0–15.0)
MCHC: 32.2 g/dL (ref 30.0–36.0)
MCHC: 32.5 g/dL (ref 30.0–36.0)
MCHC: 32.7 g/dL (ref 30.0–36.0)
MCHC: 32.9 g/dL (ref 30.0–36.0)
MCHC: 33.2 g/dL (ref 30.0–36.0)
MCHC: 32.5 g/dL (ref 30.0–36.0)
MCV: 86.5 fL (ref 78.0–100.0)
MCV: 87.9 fL (ref 78.0–100.0)
MCV: 86.1 fL (ref 78.0–100.0)
MCV: 86.8 fL (ref 78.0–100.0)
Platelets: 176 10*3/uL (ref 150–400)
Platelets: 207 10*3/uL (ref 150–400)
Platelets: 221 10*3/uL (ref 150–400)
RBC: 4.08 MIL/uL (ref 3.87–5.11)
RBC: 4.17 MIL/uL (ref 3.87–5.11)
RBC: 4.39 MIL/uL (ref 3.87–5.11)
RDW: 15 % (ref 11.5–15.5)
RDW: 15.1 % (ref 11.5–15.5)
RDW: 15.1 % (ref 11.5–15.5)
RDW: 15.2 % (ref 11.5–15.5)
RDW: 15.1 % (ref 11.5–15.5)
WBC: 9.3 10*3/uL (ref 4.0–10.5)
WBC: 8.3 10*3/uL (ref 4.0–10.5)

## 2010-07-08 LAB — DIFFERENTIAL
Basophils Absolute: 0 10*3/uL (ref 0.0–0.1)
Basophils Absolute: 0 10*3/uL (ref 0.0–0.1)
Basophils Relative: 0 % (ref 0–1)
Basophils Relative: 0 % (ref 0–1)
Basophils Relative: 1 % (ref 0–1)
Basophils Relative: 0 % (ref 0–1)
Eosinophils Absolute: 0.1 10*3/uL (ref 0.0–0.7)
Eosinophils Absolute: 0 10*3/uL (ref 0.0–0.7)
Eosinophils Relative: 1 % (ref 0–5)
Eosinophils Relative: 2 % (ref 0–5)
Eosinophils Relative: 3 % (ref 0–5)
Eosinophils Relative: 3 % (ref 0–5)
Lymphocytes Relative: 21 % (ref 12–46)
Lymphs Abs: 2.2 10*3/uL (ref 0.7–4.0)
Lymphs Abs: 1.7 10*3/uL (ref 0.7–4.0)
Monocytes Absolute: 0.3 10*3/uL (ref 0.1–1.0)
Monocytes Absolute: 0.5 10*3/uL (ref 0.1–1.0)
Monocytes Absolute: 0.6 10*3/uL (ref 0.1–1.0)
Monocytes Absolute: 0.4 10*3/uL (ref 0.1–1.0)
Monocytes Relative: 5 % (ref 3–12)
Monocytes Relative: 6 % (ref 3–12)
Neutro Abs: 4.9 10*3/uL (ref 1.7–7.7)
Neutro Abs: 7.4 10*3/uL (ref 1.7–7.7)
Neutrophils Relative %: 91 % — ABNORMAL HIGH (ref 43–77)

## 2010-07-08 LAB — POCT CARDIAC MARKERS
CKMB, poc: 1 ng/mL (ref 1.0–8.0)
Myoglobin, poc: 71.8 ng/mL (ref 12–200)
Troponin i, poc: <0.05 ng/mL (ref 0.00–0.09)

## 2010-07-08 LAB — COMPREHENSIVE METABOLIC PANEL
ALT: 17 U/L (ref 0–35)
AST: 13 U/L (ref 0–37)
Albumin: 3.3 g/dL — ABNORMAL LOW (ref 3.5–5.2)
Alkaline Phosphatase: 53 U/L (ref 39–117)
CO2: 32 mEq/L (ref 19–32)
Chloride: 102 mEq/L (ref 96–112)
Creatinine, Ser: 0.95 mg/dL (ref 0.4–1.2)
GFR calc Af Amer: >60 mL/min (ref >60)
GFR calc non Af Amer: >60 mL/min (ref >60)
Potassium: 4.3 mEq/L (ref 3.5–5.1)
Sodium: 140 mEq/L (ref 135–145)
Total Bilirubin: 0.7 mg/dL (ref 0.3–1.2)

## 2010-07-08 LAB — CULTURE, BLOOD (ROUTINE X 2): Culture: NO GROWTH

## 2010-07-08 LAB — BASIC METABOLIC PANEL
BUN: 15 mg/dL (ref 6–23)
BUN: 15 mg/dL (ref 6–23)
CO2: 31 mEq/L (ref 19–32)
CO2: 31 mEq/L (ref 19–32)
CO2: 32 mEq/L (ref 19–32)
CO2: 33 mEq/L — ABNORMAL HIGH (ref 19–32)
CO2: 28 mEq/L (ref 19–32)
Calcium: 8 mg/dL — ABNORMAL LOW (ref 8.4–10.5)
Calcium: 8.3 mg/dL — ABNORMAL LOW (ref 8.4–10.5)
Chloride: 103 mEq/L (ref 96–112)
Chloride: 103 mEq/L (ref 96–112)
Chloride: 101 mEq/L (ref 96–112)
Creatinine, Ser: 0.98 mg/dL (ref 0.4–1.2)
Creatinine, Ser: 1.02 mg/dL (ref 0.4–1.2)
GFR calc Af Amer: >60 mL/min (ref >60)
GFR calc Af Amer: >60 mL/min (ref >60)
GFR calc non Af Amer: >60 mL/min (ref >60)
Glucose, Bld: 104 mg/dL — ABNORMAL HIGH (ref 70–99)
Glucose, Bld: 104 mg/dL — ABNORMAL HIGH (ref 70–99)
Glucose, Bld: 93 mg/dL (ref 70–99)
Glucose, Bld: 164 mg/dL — ABNORMAL HIGH (ref 70–99)
Potassium: 3.6 mEq/L (ref 3.5–5.1)
Potassium: 4 mEq/L (ref 3.5–5.1)
Potassium: 4.5 mEq/L (ref 3.5–5.1)
Sodium: 139 mEq/L (ref 135–145)
Sodium: 139 mEq/L (ref 135–145)
Sodium: 141 mEq/L (ref 135–145)
Sodium: 143 mEq/L (ref 135–145)

## 2010-07-08 LAB — URINALYSIS, ROUTINE W REFLEX MICROSCOPIC
Bilirubin Urine: NEGATIVE
Bilirubin Urine: NEGATIVE
Glucose, UA: NEGATIVE mg/dL
Hgb urine dipstick: NEGATIVE
Ketones, ur: NEGATIVE mg/dL
Specific Gravity, Urine: 1.01 (ref 1.005–1.030)
Specific Gravity, Urine: 1.012 (ref 1.005–1.030)
pH: 6.5 (ref 5.0–8.0)
pH: 5 (ref 5.0–8.0)

## 2010-07-08 LAB — URINE MICROSCOPIC-ADD ON

## 2010-07-08 LAB — HEPATIC FUNCTION PANEL
Albumin: 3.3 g/dL — ABNORMAL LOW (ref 3.5–5.2)
Indirect Bilirubin: 0.4 mg/dL (ref 0.3–0.9)
Total Protein: 6.5 g/dL (ref 6.0–8.3)

## 2010-07-08 LAB — LIPID PANEL
Cholesterol: 160 mg/dL (ref 0–200)
LDL Cholesterol: 93 mg/dL (ref 0–99)
Total CHOL/HDL Ratio: 5.2 RATIO

## 2010-07-08 LAB — PREGNANCY, URINE: Preg Test, Ur: NEGATIVE

## 2010-07-08 LAB — PROTIME-INR: Prothrombin Time: 13.8 seconds (ref 11.6–15.2)

## 2010-07-08 LAB — BRAIN NATRIURETIC PEPTIDE: Pro B Natriuretic peptide (BNP): 108 pg/mL — ABNORMAL HIGH (ref 0.0–100.0)

## 2010-07-09 LAB — BASIC METABOLIC PANEL
CO2: 31 mEq/L (ref 19–32)
Calcium: 8.3 mg/dL — ABNORMAL LOW (ref 8.4–10.5)
Creatinine, Ser: 0.97 mg/dL (ref 0.4–1.2)
Glucose, Bld: 104 mg/dL — ABNORMAL HIGH (ref 70–99)

## 2010-07-09 LAB — COMPREHENSIVE METABOLIC PANEL
ALT: 13 U/L (ref 0–35)
Alkaline Phosphatase: 46 U/L (ref 39–117)
CO2: 25 mEq/L (ref 19–32)
Chloride: 104 mEq/L (ref 96–112)
GFR calc non Af Amer: 56 mL/min — ABNORMAL LOW (ref >60)
Glucose, Bld: 108 mg/dL — ABNORMAL HIGH (ref 70–99)
Potassium: 3.8 mEq/L (ref 3.5–5.1)
Sodium: 141 mEq/L (ref 135–145)
Total Protein: 6 g/dL (ref 6.0–8.3)

## 2010-07-09 LAB — LEGIONELLA ANTIGEN, URINE

## 2010-07-09 LAB — POCT I-STAT, CHEM 8
BUN: 20 mg/dL (ref 6–23)
Calcium, Ion: 1.11 mmol/L — ABNORMAL LOW (ref 1.12–1.32)
Glucose, Bld: 100 mg/dL — ABNORMAL HIGH (ref 70–99)
TCO2: 31 mmol/L (ref 0–100)

## 2010-07-09 LAB — URINE MICROSCOPIC-ADD ON

## 2010-07-09 LAB — CARDIAC PANEL(CRET KIN+CKTOT+MB+TROPI)
CK, MB: 1.2 ng/mL (ref 0.3–4.0)
Relative Index: INVALID (ref 0.0–2.5)
Total CK: 61 U/L (ref 7–177)
Total CK: 71 U/L (ref 7–177)
Troponin I: <0.01 ng/mL (ref 0.00–0.06)

## 2010-07-09 LAB — LIPID PANEL
Cholesterol: 151 mg/dL (ref 0–200)
LDL Cholesterol: 94 mg/dL (ref 0–99)
Total CHOL/HDL Ratio: 5.8 RATIO
Triglycerides: 157 mg/dL — ABNORMAL HIGH (ref <150)
VLDL: 31 mg/dL (ref 0–40)

## 2010-07-09 LAB — DIFFERENTIAL
Basophils Relative: 0 % (ref 0–1)
Eosinophils Absolute: 0.2 10*3/uL (ref 0.0–0.7)
Monocytes Relative: 5 % (ref 3–12)
Neutrophils Relative %: 69 % (ref 43–77)

## 2010-07-09 LAB — RAPID URINE DRUG SCREEN, HOSP PERFORMED: Barbiturates: NOT DETECTED

## 2010-07-09 LAB — CBC
Hemoglobin: 12.1 g/dL (ref 12.0–15.0)
RBC: 4.11 MIL/uL (ref 3.87–5.11)
WBC: 8.9 10*3/uL (ref 4.0–10.5)

## 2010-07-09 LAB — CULTURE, BLOOD (ROUTINE X 2)

## 2010-07-09 LAB — URINALYSIS, ROUTINE W REFLEX MICROSCOPIC
Glucose, UA: NEGATIVE mg/dL
Hgb urine dipstick: NEGATIVE
Specific Gravity, Urine: 1.022 (ref 1.005–1.030)

## 2010-07-09 LAB — PROTIME-INR: INR: 1 (ref 0.00–1.49)

## 2010-07-17 ENCOUNTER — Other Ambulatory Visit: Payer: Self-pay | Admitting: *Deleted

## 2010-07-17 NOTE — Telephone Encounter (Signed)
Investigated her history with Xanax since she had only been prescribed it a few times in our clinic and there was a note from the last time that this medication was provided by her OB/GYN.  Narcotic database was checked and she had been getting this consistently from Dr. York Ram prior to March of 2012.  I spoke with Dr. [name] concerning this and we decided that the [facility] would place her on a narcotic contract per our policy for the Percocet only and he would monitor her and refill her Xanax prescription.  He also stated that the patient should be told that she needs to make a follow up appointment with him to refill the medication. So at this time I will refuse the medication and have her follow up with Dr. [name].  She will also need to be placed on a Narcotic contract that states she gets the Percocet from [place] and the Xanax from Dr. [name].    I will forward this note to Dr. Jobe Igo who she has an appointment with in the [facility] [place] on 07/22/10.

## 2010-07-18 NOTE — Telephone Encounter (Signed)
Noted, thanks for the information

## 2010-07-21 IMAGING — CR DG KNEE COMPLETE 4+V*R*
4 series · 4 of 4 positions shown · non-contrast
Comparison: 11/21/2007

CLINICAL DATA: Pain and swelling.

RIGHT KNEE - COMPLETE 4+ VIEW

[t knee ap right *]
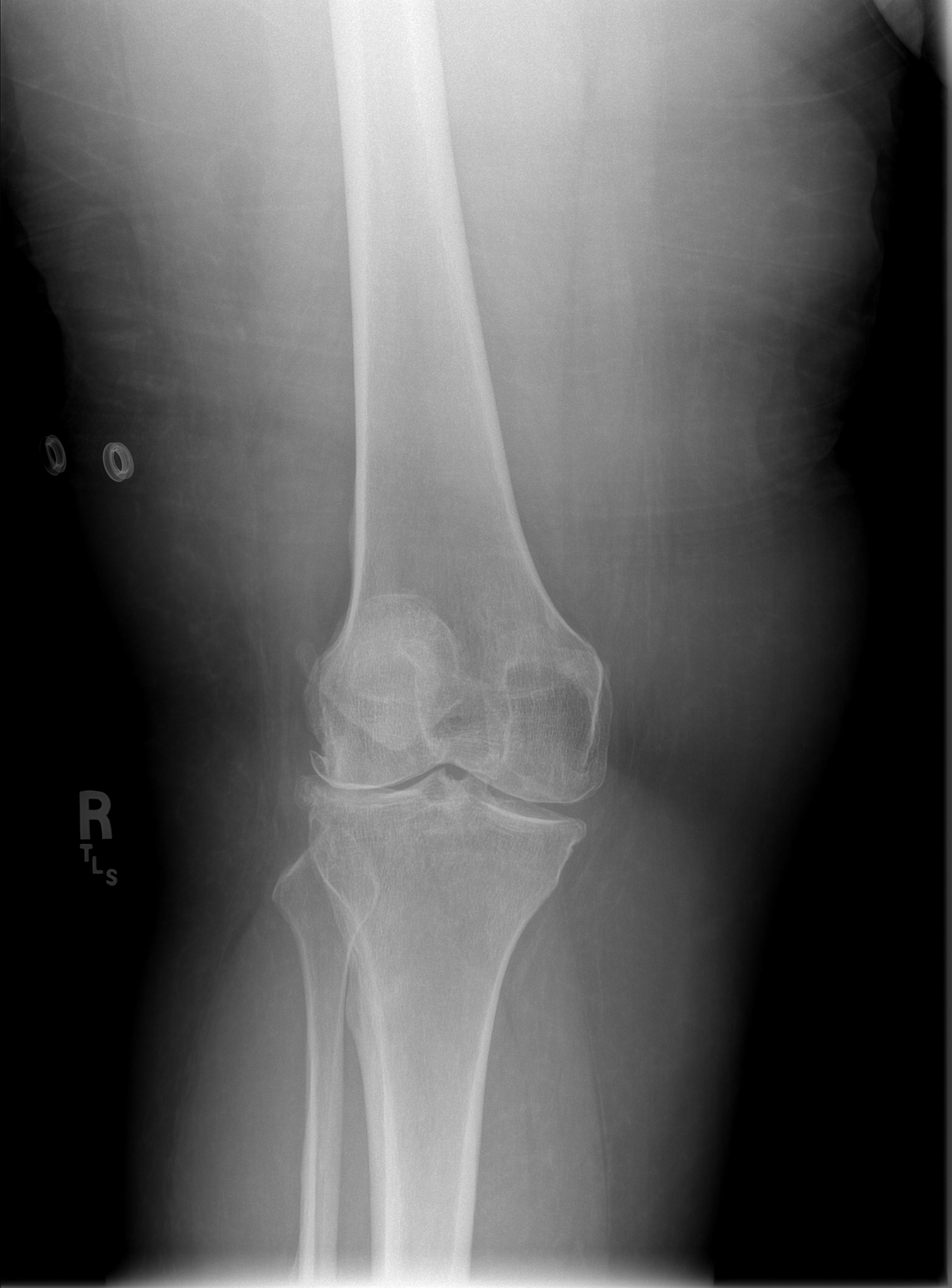

[t knee oblique right (1 of 2)]
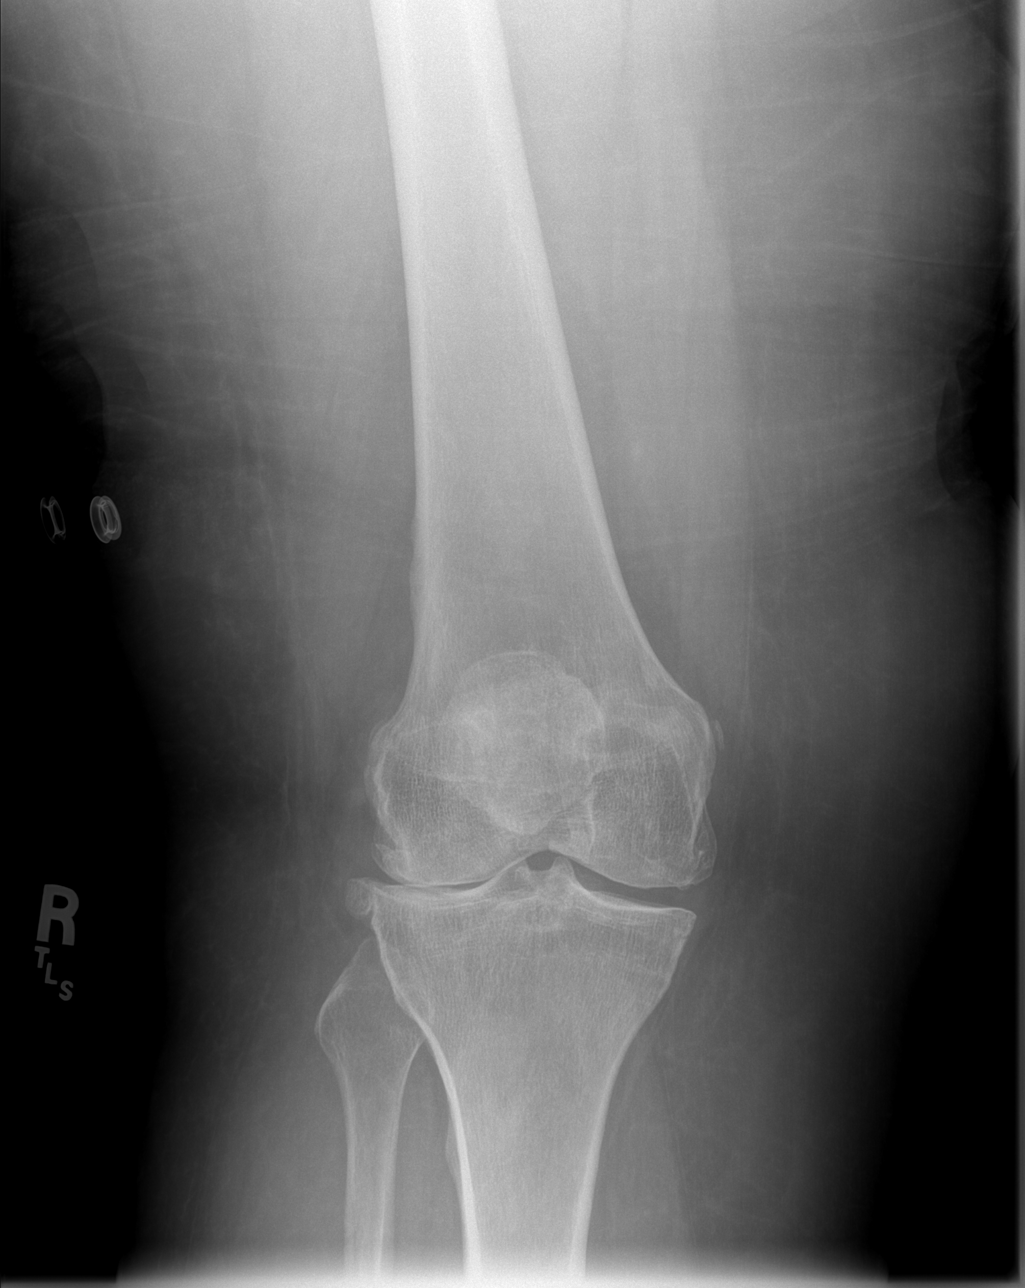

[t knee oblique right (2 of 2)]
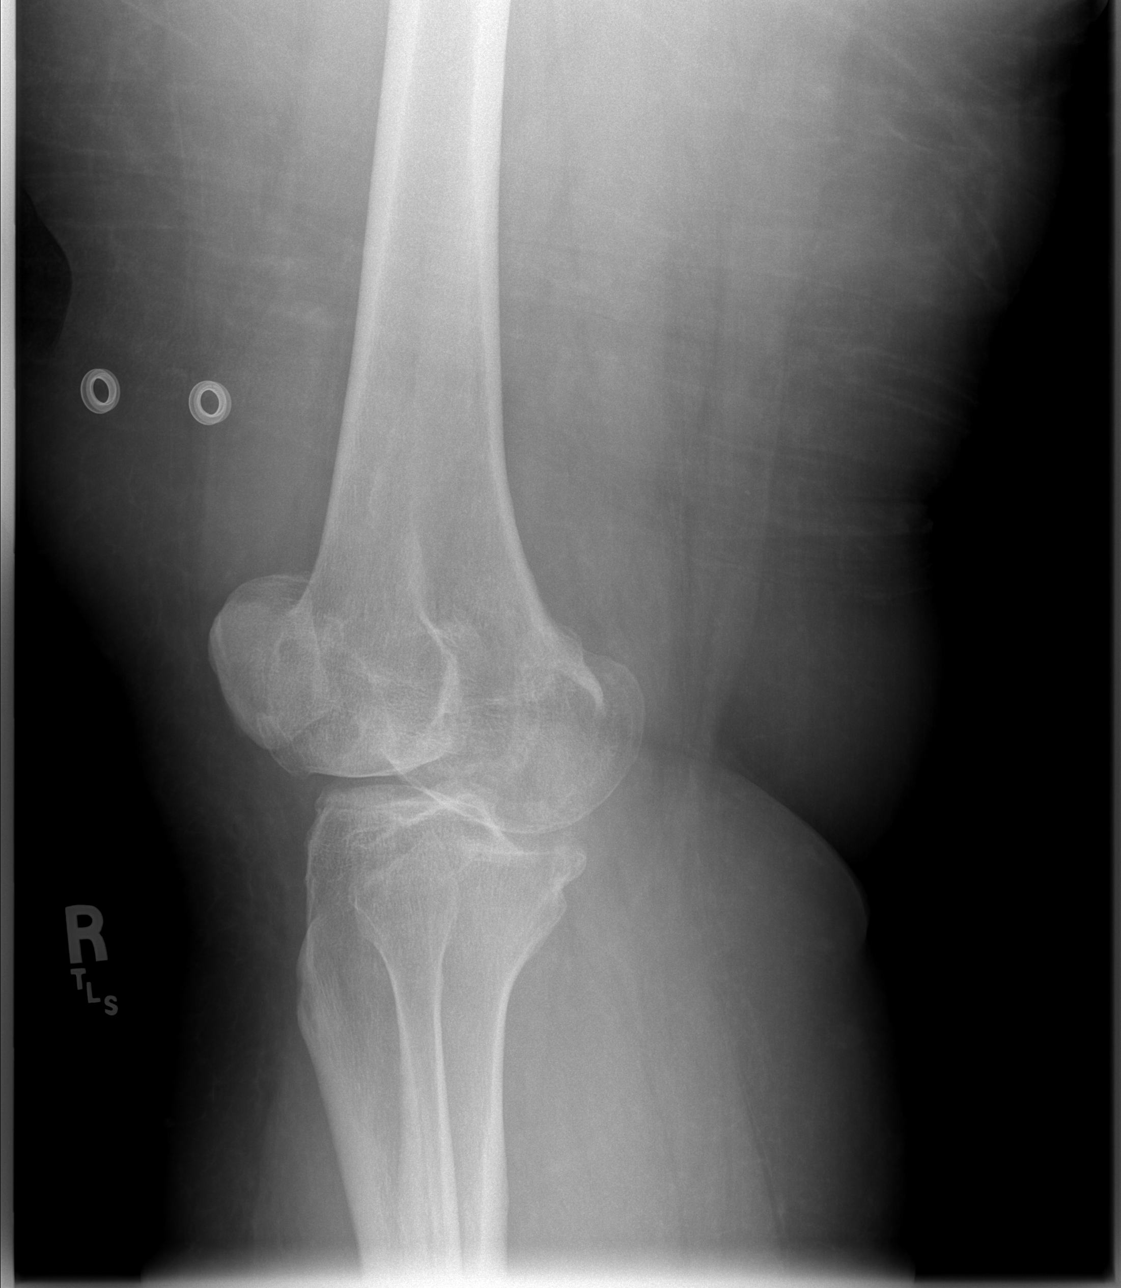

[t knee lat right *]
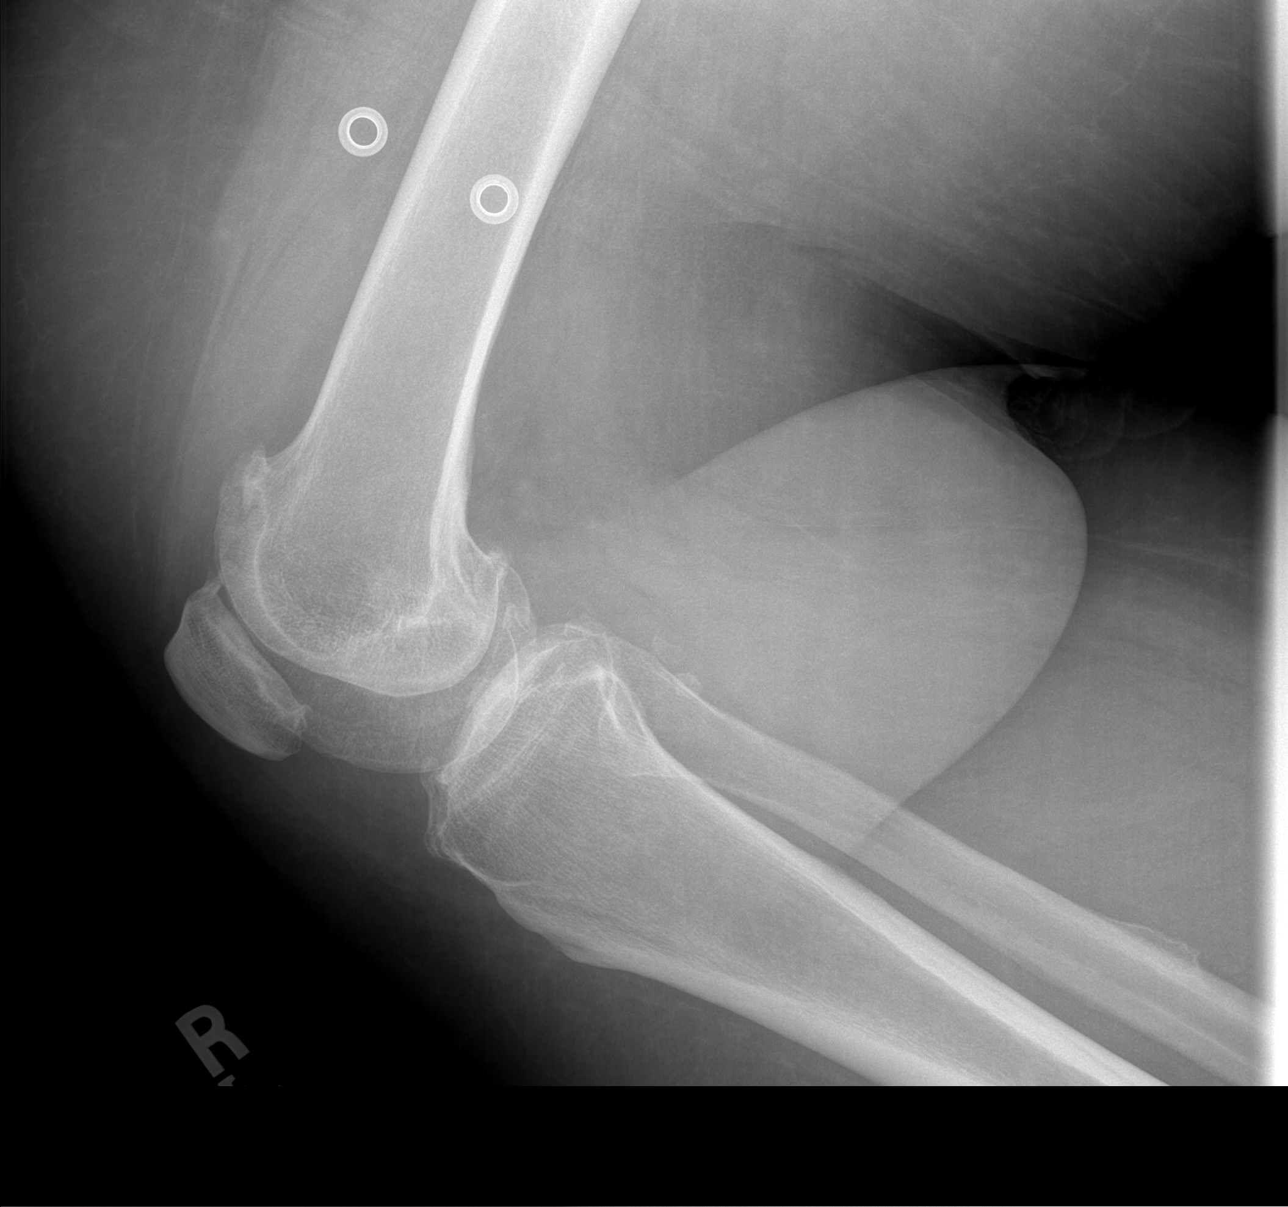

[4 of 4 positions shown; findings below may reference images not displayed]

FINDINGS: There is advanced degenerative change in the lateral
compartment with joint space narrowing and marginal spurring.
There is also moderate degenerative change of the patellofemoral
joint and mild degenerative change in the medial compartment.
These findings are stable since the  prior study.  There is a joint
effusion of moderate size.

Negative for fracture.
IMPRESSION: Negative for acute abnormality

Degenerative changes with a joint effusion are unchanged from  the
prior study.

## 2010-07-22 ENCOUNTER — Ambulatory Visit (INDEPENDENT_AMBULATORY_CARE_PROVIDER_SITE_OTHER): Payer: Medicaid Other | Admitting: Internal Medicine

## 2010-07-22 DIAGNOSIS — N3941 Urge incontinence: Secondary | ICD-10-CM

## 2010-07-22 DIAGNOSIS — L0390 Cellulitis, unspecified: Secondary | ICD-10-CM

## 2010-07-22 DIAGNOSIS — L0291 Cutaneous abscess, unspecified: Secondary | ICD-10-CM

## 2010-07-22 DIAGNOSIS — Q828 Other specified congenital malformations of skin: Secondary | ICD-10-CM

## 2010-07-22 DIAGNOSIS — F419 Anxiety disorder, unspecified: Secondary | ICD-10-CM

## 2010-07-22 DIAGNOSIS — F411 Generalized anxiety disorder: Secondary | ICD-10-CM

## 2010-07-22 DIAGNOSIS — I10 Essential (primary) hypertension: Secondary | ICD-10-CM

## 2010-07-22 MED ORDER — SALICYLIC ACID 6 % EX SHAM: MEDICATED_SHAMPOO | CUTANEOUS | Status: AC

## 2010-07-22 MED ORDER — CLONIDINE HCL 0.2 MG PO TABS: 0.2000 mg | ORAL_TABLET | Freq: Two times a day (BID) | ORAL | Status: DC

## 2010-07-22 MED ORDER — SOLIFENACIN SUCCINATE 5 MG PO TABS: 5.0000 mg | ORAL_TABLET | Freq: Every day | ORAL | Status: DC

## 2010-07-22 MED ORDER — ALPRAZOLAM 1 MG PO TABS: 1.0000 mg | ORAL_TABLET | Freq: Two times a day (BID) | ORAL | Status: AC | PRN

## 2010-07-22 NOTE — Progress Notes (Signed)
  Subjective:    Patient ID: Kara Vincent, female    DOB: 26-Jan-1966, 45 y.o.   MRN: [id number]  HPI Here to recheck back I&D site No interval issues  Review of Systems     Objective:   Physical Exam Back wound healing well-has superficial region of scab but otherwise completed closure       Assessment & Plan:  Status post I&D back abscess FU PRN Asking for a refill of her oxycodone-last given on 07/01/10-refill in 30 days only Other refills brought up to date.

## 2010-07-30 ENCOUNTER — Other Ambulatory Visit: Payer: Self-pay | Admitting: *Deleted

## 2010-07-30 NOTE — Telephone Encounter (Signed)
I am not comfortable Rx'ing wince pt has gotten controlled subs from other providers. Dr Obie Dredge will be back tomorrow and can then decide.

## 2010-07-30 NOTE — Telephone Encounter (Signed)
Last refill 4/9 Pt # [phone]

## 2010-07-31 ENCOUNTER — Other Ambulatory Visit: Payer: Self-pay | Admitting: *Deleted

## 2010-07-31 ENCOUNTER — Other Ambulatory Visit (INDEPENDENT_AMBULATORY_CARE_PROVIDER_SITE_OTHER): Payer: Medicaid Other

## 2010-07-31 ENCOUNTER — Ambulatory Visit: Payer: Medicaid Other | Admitting: Internal Medicine

## 2010-07-31 DIAGNOSIS — N390 Urinary tract infection, site not specified: Secondary | ICD-10-CM

## 2010-07-31 DIAGNOSIS — M549 Dorsalgia, unspecified: Secondary | ICD-10-CM

## 2010-07-31 LAB — POCT URINALYSIS DIPSTICK
Blood, UA: NEGATIVE
Glucose, UA: NEGATIVE
Ketones, UA: NEGATIVE
Leukocytes, UA: NEGATIVE
Nitrite, UA: NEGATIVE
Protein, UA: NEGATIVE
Spec Grav, UA: >1.03
Urobilinogen, UA: 0.2
pH, UA: 5

## 2010-07-31 MED ORDER — OXYCODONE-ACETAMINOPHEN 10-325 MG PO TABS: 1.0000 | ORAL_TABLET | Freq: Three times a day (TID) | ORAL | Status: DC | PRN

## 2010-07-31 NOTE — Telephone Encounter (Signed)
Ms. Parnes is okay to get her refill today.  When she comes for the appointment she needs to sign a narcotic contract stating that she gets her Xanax from Dr. York Ram and the Percocet from [facility] and only [place].  She will also need to leave a UDS per our policy.  If she is not able to do this she should not receive the prescription.

## 2010-08-01 LAB — DRUGS OF ABUSE SCREEN W/O ALC, ROUTINE URINE
Barbiturate Quant, Ur: NEGATIVE
Cocaine Metabolites: NEGATIVE
Marijuana Metabolite: NEGATIVE
Opiate Screen, Urine: NEGATIVE
Phencyclidine (PCP): NEGATIVE
Propoxyphene: NEGATIVE

## 2010-08-02 ENCOUNTER — Ambulatory Visit (INDEPENDENT_AMBULATORY_CARE_PROVIDER_SITE_OTHER): Payer: Medicaid Other | Admitting: Internal Medicine

## 2010-08-02 ENCOUNTER — Encounter: Payer: Self-pay | Admitting: Internal Medicine

## 2010-08-02 ENCOUNTER — Inpatient Hospital Stay: Admission: EM | Admit: 2010-08-02 | Payer: Self-pay | Source: Ambulatory Visit | Admitting: Internal Medicine

## 2010-08-02 DIAGNOSIS — G4700 Insomnia, unspecified: Secondary | ICD-10-CM

## 2010-08-02 DIAGNOSIS — J45909 Unspecified asthma, uncomplicated: Secondary | ICD-10-CM

## 2010-08-02 DIAGNOSIS — R209 Unspecified disturbances of skin sensation: Secondary | ICD-10-CM

## 2010-08-02 DIAGNOSIS — H9209 Otalgia, unspecified ear: Secondary | ICD-10-CM | POA: Insufficient documentation

## 2010-08-02 DIAGNOSIS — R200 Anesthesia of skin: Secondary | ICD-10-CM | POA: Insufficient documentation

## 2010-08-02 DIAGNOSIS — Q828 Other specified congenital malformations of skin: Secondary | ICD-10-CM

## 2010-08-02 LAB — URINE CULTURE

## 2010-08-02 MED ORDER — TOBRAMYCIN 0.3 % OP OINT: TOPICAL_OINTMENT | Freq: Two times a day (BID) | OPHTHALMIC | Status: AC

## 2010-08-02 MED ORDER — ALBUTEROL SULFATE (2.5 MG/3ML) 0.083% IN NEBU
2.5000 mg | INHALATION_SOLUTION | Freq: Once | RESPIRATORY_TRACT | Status: AC
  Administered 2010-08-02: 2.5 mg via RESPIRATORY_TRACT

## 2010-08-02 MED ORDER — TRAZODONE HCL 50 MG PO TABS: 100.0000 mg | ORAL_TABLET | Freq: Every day | ORAL | Status: DC

## 2010-08-02 MED ORDER — TOBRAMYCIN 0.3 % OP OINT
TOPICAL_OINTMENT | Freq: Two times a day (BID) | OPHTHALMIC | Status: DC
Start: 2010-08-02 — End: 2010-08-02

## 2010-08-02 MED ORDER — FLUTICASONE-SALMETEROL 250-50 MCG/DOSE IN AEPB: 1.0000 | INHALATION_SPRAY | Freq: Two times a day (BID) | RESPIRATORY_TRACT | Status: DC

## 2010-08-02 NOTE — Progress Notes (Signed)
Subjective:    Patient ID: Kara Vincent, female    DOB: 01-Jun-1965, 45 y.o.   MRN: [id number]  HPI  Kara Vincent is a 45 year old woman who presents today complaining of ear pain in both ears for the last 2-3 days, frontal headache, and numbness and tingling in both feet for several weeks.    She states that the ear pain started over the last 2-3 days.  The pain feels inside the ear and is worse with any touching of the ear.  No drainage noted and there is no change in her hearing.  She denies any sinus congestion, fever, chills, cough, nausea, vomiting, sore throat, or eye pain.  She states that she has been using a bobby pin to clean out her ear lately.   She states that over the last few weeks she has noticed that she has episodes of numbness and tingling in her feet.  She states that sometimes it happens when she has been sitting in her wheelchair for a long time or if she is sitting in her bathtub for a long time with her legs extended.  She denies any weakness, shooting pain down her legs, bowel or bladder incontinence or numbness in her groin.  She states that if she moves position and takes the pressure off her legs they feel better.   Review of Systems    Constitutional: Denies fever, chills, diaphoresis, appetite change and fatigue.  HEENT: Denies photophobia, eye pain, redness, hearing loss, ear pain, congestion, sore throat, rhinorrhea, sneezing, mouth sores, trouble swallowing, neck pain, neck stiffness and tinnitus.   Respiratory: Denies SOB, DOE, cough, chest tightness,  and wheezing.   Cardiovascular: Denies chest pain, palpitations and leg swelling.  Gastrointestinal: Denies nausea, vomiting, abdominal pain, diarrhea, constipation, blood in stool and abdominal distention.  Genitourinary: Denies dysuria, urgency, frequency, hematuria, flank pain and difficulty urinating.  Musculoskeletal: Denies myalgias, back pain, joint swelling, arthralgias and gait problem.  Skin: Denies  pallor, rash and wound.  Neurological: Denies dizziness, seizures, syncope, weakness, light-headedness, numbness and headaches.  Hematological: Denies adenopathy. Easy bruising, personal or family bleeding history  Psychiatric/Behavioral: Denies suicidal ideation, mood changes, confusion, nervousness, sleep disturbance and agitation   Objective:   Physical Exam    Constitutional: Vital signs reviewed O2 saturation 92-96%.  Patient is an obese woman  in no acute distress and cooperative with exam. Alert and oriented x3.  Head: Normocephalic and atraumatic Ear: TM normal bilaterally, the external ear canal shows some scabbing and excoriation marks.  No erythema.  Mouth: no erythema or exudates, MMM Eyes: PERRL, EOMI, conjunctivae is red and injected bilaterally, No scleral icterus.  Neck: Supple, Trachea midline normal ROM, No JVD, mass, thyromegaly, or carotid bruit present.  Cardiovascular: RRR, S1 normal, S2 normal, no MRG, pulses symmetric and intact bilaterally Pulmonary/Chest: Bibasilar expiratory wheezes noted , rales, or rhonchi.   Abdominal: Soft. Non-tender, non-distended, bowel sounds are normal, no masses, organomegaly, or guarding present.  GU: no CVA tenderness Musculoskeletal: No joint deformities, erythema, or stiffness, ROM full and no nontender Hematology: no cervical, inginal, or axillary adenopathy.  Neurological: A&O x3, Strenght is normal and symmetric bilaterally, cranial nerve II-XII are grossly intact, no focal motor deficit, sensory intact to light touch bilaterally.  Skin: Warm, dry and intact. Scattered macular rash unchanged from her baseline.  No abscess or open draining lesions noted.  Previous abscess on her left back healing well with no erythema or warmth. No cyanosis, or clubbing.  Psychiatric:  Normal mood and affect. speech and behavior is normal. Judgment and thought content normal. Cognition and memory are normal.   Assessment & Plan:

## 2010-08-02 NOTE — Patient Instructions (Addendum)
Change your Advair to Advair 250/50 1 puff twice daily for your breathing.  You need to make sure you take it EVERY DAY no matter how you feel.  The medicine is to make sure you don't have a problem breathing instead of treating it when you do have a problem.  Increase your Trazodone to Trazodone 50 mg tablets take 2 each night at bedtime.  This is again a medicine you should take every single night to help you sleep.    Start the Tobrex.  Apply a small amount to each eye as directed.  To help clean your ears you can get a small dropper bottle and fill it half with rubbing alcohol and half with vinegar and mix it.  Then drop 1-2 drops in each ear twice daily to help clean the wax out and help keep infection away.  Follow up in 1-3 months.

## 2010-08-05 LAB — OPIATE, QUANTITATIVE, URINE
Hydrocodone: NEGATIVE NG/ML
Morphine Urine: NEGATIVE NG/ML

## 2010-08-06 NOTE — Discharge Summary (Signed)
NAMEMarland Kitchen  Kara Vincent, Kara Vincent             ACCOUNT NO.:  [id number]   MEDICAL RECORD NO.:  [id number]          PATIENT TYPE:  INP   LOCATION:  3012                         FACILITY:  [facility]   PHYSICIAN:  Rico Sheehan, D.O.  DATE OF BIRTH:  [birth year]   DATE OF ADMISSION:  01/21/2008  DATE OF DISCHARGE:  01/25/2008                               DISCHARGE SUMMARY   DISCHARGE DIAGNOSES:  1. Transient loss of consciousness - unclear etiology, the patient      likely fell due to severe osteoarthritis in her knees, no loss of      consciousness during hospitalization, resolved on discharge.  2. 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  3. Chronic asthma - well controlled with albuterol and Advair.  4. Hypertension - well controlled with medications.  5. Chronic dermatitis - likely secondary to poor hygiene, on back and      lower extremities.  6. Urge incontinence - well controlled with oxybutynin.  7. Homelessness- currently living with a friend.   DISCHARGE MEDICATIONS:  Hydrocortisone cream 1% apply once daily over  the back and lower extremities  Imitrex  50 mg once daily as needed for headaches  Advair 250/50 mg 1 puff twice daily  Proventil HFA 1 puff every 4-6 hours as needed  Oxybutynin 5 mg 1 tablet twice daily  Clonidine 0.2 mg twice daily  Hydrocodone/acetaminophen 7.5/750 mg 4 times per day as needed for pain  Benadryl 25 mg capsules every 8 hours as needed  Hydrocortisone 2% lotion apply over red areas twice per day   Stop taking erythromycin eye drops, permethrin cream,  and also stop  taking Elestat drops.   DISPOSITION AND FOLLOW UP:  The patient will follow up with Dr. Redmond Pulling  in outpatient clinic on March 08, 2008. On follow up appointment  please evaluate if any new episodes of falling. Also evaluate if  adequate control of asthma with current medications. No need to check  CBC and BMET since both stable during  the admission and on discharge.   PROCEDURES PERFORMED:  None.   CONSULTATIONS:  None.   ADMITTING H AND P:  A 45 year old lady with past medical history of  asthma, hypertension, chronic headaches came into clinic concerned for 3  episodes of falling in the past 2 to 3 days. She reports having bad  arthritis in her knees and has difficulty walking as a result, and  sometimes her knees give up.  She reports losing consciousness for 3  seconds (she was told so by person who saw her falling).  She denies  dizziness, vertigo, headaches (she has chronic headaches but no unusual  new headaches).  No visual changes, no diplopia, no blurry vision, no  fever, chills or chest pain.  She does have some shortness of breath due  to her chronic asthma but nothing new.  Denies recent sick contact or  history of recent traveling, no abdominal or urinary complaints.  She  does have a chronic urge incontinence which is well controlled with her  current medication.  She denies nausea, vomiting, diarrhea,  constipation.   PHYSICAL EXAMINATION:  VITAL SIGNS:  Vitals on admission:  T = 98  Fahrenheit, BP = 156/95, P = 66, R = 18, saturating 97% on RA.  CARDIOVASCULAR: RRR, no murmurs, rubs or gallops.  NECK:  No JVD or carotid bruits.  SKIN:  rash over lower extremities and back, scaly, chronic itching,  changes, lichenification.  NEUROLOGIC:  A & O x3.  Strength 5/5 in upper and lower extremities  bilaterally.  Sensation intact to soft touch bilaterally.  Normal heel-  to-shin, normal finger-to-nose, and no focal neurologic deficits.   LABORATORY DATA:  Labs on admission sodium 141, potassium 3.5, chloride  105, bicarb 28, BUN 12, creatinine 1.04 and glucose 109. White blood  cells 9.9, ANC 7, hemoglobin 11.9, MCV 88.1, platelets 230, calcium 8.3.   HOSPITAL COURSE BY PROBLEM:  1. Transient loss of consciousness - unclear etiology of falling      episodes, likely secondary to severe osteoarthritis  in patient's      knees (bilateral OA), or even possibly related to social issues      (homelessness).  Syncope workup was done.  The patient was observed      on telemetry for 23 hours and no problems were noted.  No      arrhythmias, no ST-T wave abnormalities on EKG (on EKG normal sinus      rhythm at 78 beats per minute, good R-wave progression, normal      intervals), POC markers negative.  No electrolyte abnormalities.      No abnormalities on CBC, Orthostatics were checked and were within      normal limits.  TSH within normal limits.  Vitamin B12, ferritin,      RPR and UDS were all negative and the patient stayed afebrile      during the hospitalization.  No episodes of LOC and was discharged      in stable condition.  2. Severe osteoarthritis in the knees bilaterally - no x-rays done      during this hospitalization since already done numerous times in      the past and all showed severe tricompartmental disease and      osteoarthritis in both knees.  The patient is on Percocet for pain      control and the patient does report that her knees occasionally      give up.  This could be possibly contributing to her falling      episodes.  She will continue to follow up in outpatient clinic for      further management.   DISCHARGE VITALS AND LABS:  Temperature 97.6, pulse 61, respirations 18,  blood pressure 140/76.  Saturating 92% on room air.   Sodium 142, potassium 4.1, chloride 104, bicarb 31, BUN 17, creatinine  1.05, glucose 71, white blood cell 7.3, hemoglobin 12.4, platelets 214  and calcium 8.5.      Trinidad Curet, MD  Electronically Signed      Rico Sheehan, D.O.  Electronically Signed    IM/MEDQ  D:  01/25/2008  T:  01/25/2008  Job:  [id number]   cc:   Redmond Pulling, MD

## 2010-08-06 NOTE — Discharge Summary (Signed)
NAMEMarland Vincent  CARIME, [name]             ACCOUNT NO.:  [id number]   MEDICAL RECORD NO.:  [id number]          PATIENT TYPE:  INP   LOCATION:  5503                         FACILITY:  [facility]   PHYSICIAN:  Lucy Chris, MD     DATE OF BIRTH:  Mar 01, 1966   DATE OF ADMISSION:  04/07/2008  DATE OF DISCHARGE:  04/08/2008                               DISCHARGE SUMMARY   DISCHARGE DIAGNOSES:  1. Shortness of breath most likely secondary to chronic asthma/vocal      cord dysfunction.  2. Obstructive sleep apnea.  3. Hypertension.  4. Urge incontinence.  5. Chronic dermatitis.  6. Osteoarthritis of the knee.  7. Morbid obesity.  8. Chronic migraines.  9. Homelessness.  10.Very frequent Emergency Department visits of 8, since the beginning      of December 2009, all related to the patient's chronic shortness of      breath.   DISCHARGE MEDICATIONS:  1. Prednisone taper 50 mg on the first day, 40 mg on second day, 30 mg      on third day, 20 mg on fourth day, and 10 mg on fifth day.  2. Advair 250/50 Diskus 1 puff b.i.d.  3. P.o. oxybutynin 5 mg b.i.d.  4. Albuterol 90 mcg inhaler.  5. P.o. clonidine 0.2 mg b.i.d.  6. P.o. lisinopril 2.5 mg daily.  7. P.o. Imitrex 50 mg as needed for migraines.  8. P.o. Benadryl 25 mg q.6 h. p.r.n. allergies.  9. Ala-Cort cream as directed.  10.Tessalon Perles 100 mg b.i.d.  11.P.o. Percocet 5/325 q.6 h. p.r.n. pain.   Please note that the patient was given prescriptions for the prednisone  taper, Tessalon Perles, and 20 tablets of her Percocet as she said that  she was about to run out.  The patient should follow up in the  Outpatient Clinic for further management.  The patient had missed her  previous appointment on March 27, 2008.   CONDITION ON DISCHARGE:  The patient had been sating at 93-97% on room  air throughout her ED visit and hospitalization.  She had no  leukocytosis and no fevers.  During her hospitalization, she was stable  and had mild  wheezes on physical exam, improved from when she was  discharged last on March 25, 2008.  The patient had previously missed  her hospital followup appointment on March 27, 2008 at the [facility] and we reiterated to her on this hospitalization the importance  of close outpatient followup so as to avoid such frequent ED visits and  hospitalizations.  With all the antibiotics that she has received, she  is becoming an increased risk for developing a serious infection that is  resistant to typical antibiotics.  Her social situation is complicated  by her homelessness and has made taking care of her and ensuring  compliance with her medications very difficult.   PROCEDURES:  The patient had a chest x-ray on April 07, 2008, at  [facility] ED that showed the following:  Cardiomegaly again noted.  Mild central increased bronchial and interstitial markings without  convincing edema.  Small amount of  atelectasis or early infiltrate right  perihilar.  Degenerative changes of thoracic spine.   Please again note that the patient has had 7 chest x-rays in the last  month and a half, and at the time of the chest x-ray with possible early  infiltrate in the right perihilar area, the patient was afebrile and had  no leukocytosis and had no other clinical evidence of pneumonia.   CONSULTATION:  None.   ADMISSION HISTORY AND PHYSICAL:  The patient is a 45 year old female  well known to the Internal Medicine Teaching Service with frequent  hospitalizations for asthma exacerbations, who presents with shortness  of breath and yellow productive sputum for 3-4 days.  The patient was  seen at [facility] ED for the third time this week and the patient was  transferred to [facility].  The patient reports chronic left  flank pain and chronic right knee pain.  No recent fevers or chills.  No  sick contacts.  No night sweats.  The patient's symptoms have been  progressive, but less severe  than her last admission from March 23, 2008, to March 25, 2008.   PHYSICAL EXAMINATION:  VITAL SIGNS:  Temperature of 97.8, blood pressure  of 117/48, pulse of 67, respiratory rate of 22, and O2 sats of 97% on  room air.  GENERAL:  No apparent distress, the patient was able to speak in full  sentences complete full paragraphs with absolutely no respiratory  distress all while on packing her suitcase.  ENT:  Mildly dry mucous membranes, pink oropharynx.  RESPIRATORY:  Very mild scattered expiratory wheezes, more prominent in  the upper airways and reduced when the patient breaths through her nose.  She has good air movement bilaterally.  No crackles or rhonchi.  Again,  please note the patient is able to communicate in full sentences without  any difficulty on room air.  CARDIOVASCULAR:  Distant heart sounds, regular rate and rhythm.  No  murmurs, rubs, or gallops.  GI:  Obese, scattered chronic rash, stable from previous admission.  EXTREMITIES:  Chronic 1+ lower extremity pedal edema.  Right knee is  nonswollen.  No effusions.  No erythema.  No warmthness to touch.  SKIN:  Diffuse rash that is chronic.  NEUROLOGIC:  Nonfocal.  Alert and oriented.   INITIAL LABORATORIES:  BMET:  Sodium 139, potassium 4.2, chloride of  103, bicarbonate 31, BUN of 15, creatinine of 0.85, and glucose of 104.   White blood cell count of 6.9, hemoglobin of 11.2, platelets of 4.9, RDW  of 15.1, and MCV of 86.9.   Cardiac enzymes negative x1.  BNP 67.3.   HOSPITAL COURSE:  1. Shortness of breath:  The patient has had multiple ED visits and      hospitalizations for shortness of breath likely related to the      patient's asthma/vocal cord dysfunction.  On this admission, her      clinical findings were actually improved from when she was      discharged on our Service on March 25, 2008.  However, given that      she had chest x-ray findings consistent with likely atelectasis      versus possible  pneumonia, with no leukocytosis and no fever, she      was transferred from [facility] ED to [facility].  The      patient had been started on Avelox in the ED at [facility], and  received a second dose while at [facility], but she was not      discharged on Avelox or any other antibiotic.  She was continued on      breathing treatments while hospitalized and improved overnight.      Her morning CBC on the day of discharge did show a mild      leukocytosis with a white blood cell count of 10.3, however, the      patient received IV Solu-Medrol in the ED and had been put on a      prednisone taper while hospitalized.  She should continue the      prednisone taper and continue with her breathing treatments as      directed and as previously prescribed for the patient.  She had      missed her previous hospital followup on March 27, 2008, and it      was again reiterated to the patient the importance of outpatient      followup so as to avoid ED visits and hospitalizations.  However,      her social situation is unfortunate and there may be a component of      secondary gain associated with her frequent ED visits.  2. Hypertension:  The patient's blood pressures were stable and under      goal for the entirety of her hospitalization.  She was continued on      her home doses of clonidine, lisinopril, and will be followed up in      the Outpatient Clinic.  3. Osteoarthritis of the knee:  The patient was complaining of right      knee pain, that is chronic and has been well documented before.      There was no evidence of infection - there was no warmth,      tenderness, effusion, or erythema.  Her initial white blood cell      count was normal.  The patient's symptoms were controlled with      Percocet and she will continue on this as an outpatient.  4. Chronic dermatitis:  The patient was continued on her Ala-Cort      cream as directed and should follow up in the Outpatient  Clinic.  5. Severe obstructive sleep apnea seen on March 21, 2008, sleep      study:  The patient needs a CPAP at home, however, it is difficult      given that she is homeless and is currently living in a church, but      has a very unstable social situation.  It would be ideal for her to      have a stable home situation in which she can receive CPAP at      night, along with her other medications.   DISCHARGE LABORATORIES:  Her sodium is 136, potassium 4.6, chloride of  101, bicarbonate 28, glucose of 164, BUN of 15, creatinine of 1.02.  LFTs all within normal limits.  CBC as follows:  Hemoglobin of 12.7,  hematocrit of 39.0, white blood cell count of 10.3 on steroids, and  platelet count of 207.   PENDING LABORATORIES:  There were no pending labs at that time.      Darlyne Russian, MD  Electronically Signed      Lucy Chris, MD  Electronically Signed    LW/MEDQ  D:  04/08/2008  T:  04/08/2008  Job:  [id number]   cc:   Junius Finner, MD

## 2010-08-06 NOTE — Procedures (Signed)
NAME:  Kara Vincent, Kara Vincent             ACCOUNT NO.:  [id number]   MEDICAL RECORD NO.:  [id number]          PATIENT TYPE:  OUT   LOCATION:  SLEEP CENTER                 FACILITY:  [facility]   PHYSICIAN:  Clinton D. Annamaria Boots, MD, FCCP, FACPDATE OF BIRTH:  06-25-64   DATE OF STUDY:  03/21/2008                            NOCTURNAL POLYSOMNOGRAM   REFERRING PHYSICIAN:  Stephanie Coup, MD   INDICATION FOR STUDY:  Hypersomnia with sleep apnea.   EPWORTH SLEEPINESS SCORE:  Epworth sleepiness score 5/24.  BMI 48.2.  Weight 308 pounds.  Height 67 inches. Neck 16 inches.   MEDICATIONS:  Home medications are charted and reviewed.   SLEEP ARCHITECTURE:  Split study protocol.  During the diagnostic phase,  total sleep time was 133 minutes.  Sleep efficiency 90.2%.  Stage I was  4.5%.  Stage II 81.3%.  Stage III absent.  REM 14.2% of total sleep  time.  Sleep latency 12 minutes.  REM latency 43 minutes.  Wake after  sleep onset 1 minute.  Arousal index 36 indicating increased EEG  arousal.  Bedtime medications included Benadryl, lisinopril,  benzonatate, Imitrex, and Tylenol.   RESPIRATORY DATA:  Split study protocol.  Apnea-hypopnea index (AHI)  64.3 per hour.  A total of 143 events were scored including 17  obstructive apneas and 126 hypopneas.  All events were recorded as  nonsupine.  REM AHI 101.1 per hour.  CPAP was titrated to 14 CWP, AHI 0  per hour.  She chose a medium ResMed Quattro full face mask with heated  humidifier.   OXYGEN DATA:  Moderate-to-loud snoring with oxygen desaturation to a  nadir of 60% before CPAP.  After CPAP control, mean oxygen saturation on  room air was 91.2%.   CARDIAC DATA:  Sinus rhythm with occasional PVC.   MOVEMENT-PARASOMNIA:  Bare limb jerks, not associated with EEG arousal.  No bathroom trips.   IMPRESSIONS-RECOMMENDATIONS:  1. Severe obstructive sleep apnea/hypopnea syndrome, apnea-hypopnea      index 64.3 per hour with all events recorded nonsupine.   Moderate-      to-loud snoring with oxygen desaturation to a nadir of 60%.  2. Successful continuous positive airway pressure titration to 14      centimeters of water pressure, apnea-hypopnea index 0 per hour.      She chose a medium ResMed Quattro full face mask with heated      humidifier.  3. Baseline room air oxygen saturation on arrival was 94%.  She      desaturated to 60% before continuous positive airway pressure      correction.  Mean oxygen saturation wearing continuous positive      airway pressure was only      91.2% suggesting underlying cardiopulmonary disease.  A total of      3.2 minutes was recorded with oxygen saturation less than 88% on      room air while wearing continuous positive airway pressure.      Clinton D. Annamaria Boots, MD, [facility], FACP  Diplomate, Tax adviser of Sleep Medicine  Electronically Signed     CDY/MEDQ  D:  03/25/2008 15:20:02  T:  03/26/2008 03:45:08  Job:  [id number]

## 2010-08-06 NOTE — Discharge Summary (Signed)
NAMEMarland Kitchen  ASTI, FAVREAU             ACCOUNT NO.:  [id number]   MEDICAL RECORD NO.:  [id number]           PATIENT TYPE:   LOCATION:                                 FACILITY:   PHYSICIAN:  Thomes Lolling, M.D.    DATE OF BIRTH:  22-May-1965   DATE OF ADMISSION:  02/16/2008  DATE OF DISCHARGE:  02/18/2008                               DISCHARGE SUMMARY   DISCHARGE DIAGNOSES:  1. Asthma exacerbation, resolved.  2. Probable obstructive sleep apnea, no definitive workup on record.  3. Hypertension.  4. Urge urinary incontinence.  5. Chronic dermatitis.  6. Osteoarthritis of knees.  7. Obesity.  8. Chronic migraines.  9. Homelessness.   DISCHARGE MEDICATIONS:  1. Avelox 400 mg p.o. daily x4 days.  2. Prednisone taper 60 mg taper by 10 mg daily.  3. Advair 250/50 mcg 1 puff b.i.d.  4. Oxybutynin 5 mg 1 tab b.i.d.  5. Albuterol 90 mcg 1 puff q.4 h. p.r.n.  6. Clonidine 0.2 mg p.o. b.i.d.  7. Imitrex 50 mg as previously directed.  8. Benadryl 25 mg p.o. q.6 h. as needed for allergies.  9. Ala-Cort 1% cream apply as previously directed.  10.Permethrin 5% cream apply as previously directed.  11.Percocet 7.5/500 mg p.o. q.6 h. p.r.n. for pain.   DISPOSITION/FOLLOW UP:  The patient's respiratory status was improved  and the patient was discharged home in stable condition.  The patient is  to follow up with Dr. Alfonse Alpers at the [facility]  within 2 weeks of discharge.  She will be notified at a later date of  exact appointment details.  At this time, the patient's breathing and  oxygen saturations will be reassessed, and consideration should be given  to formal sleep study for possible obstructive sleep apnea.   PROCEDURES PERFORMED:  None.   CONSULTATIONS:  None.   BRIEF ADMITTING HISTORY AND PHYSICAL:  Kara Vincent is a 45 year old  homeless woman with a past medical history significant for asthma,  hypertension, chronic headaches, obesity, overactive bladder  presenting  to the emergency department with 1 to 2-day history of shortness of  breath.  The patient reports on the day prior to admission, she had some  shortness of breath and cough productive of yellow sputum.  She states  that she started wheezing at this time as well.  She states that this is  similar to other asthma exacerbation that she has had in the past.  She  states that she lives in a residence that house with cats and this is a  known trigger for her.  She denies any chest pain, only some tightness  associated with her coughing.  No fevers, chills, nausea or vomiting.  The patient states that there is one person that lives in her residence  that may have been sick recently with a bad cough.  The patient also  complains of chronic headaches and osteoarthritis which are currently  bothering her.  She also has baseline urinary frequency, but denies any  dysuria or hematuria.   On admission, the patient had the following labs and vitals.  Temperature of 97.2%, blood pressure of 132/74, heart rate of 97,  respirations of 24, oxygen saturation of 94% on room air.  On admission,  the patient had the following physical exam.  In general, she was in no  acute distress, but had audible wheezing.  HEENT exam was notable for  equal, round, and reactive pupils.  Anicteric sclerae with extraocular  motions intact.  Oropharynx was clear with moist mucous membranes.  Neck  exam was supple without any JVD or lymphadenopathy; however, when  auscultating to her neck, it was noted that wheezings were most  prominent in her throat.  Lung exam, the patient was not in any acute  respiratory distress with no accessory muscle use.  The patient had good  air movement bilaterally and was speaking in full sentences.  The  patient had diffuse expiratory wheezes bilaterally which were improved  when the patient was asked to breathe through her nose instead of her  mouth.  Cardiovascular exam showed  regular rate and rhythm with no  murmurs, rubs, or gallops.  Abdominal exam, positive bowel sounds with  soft, nontender, nondistended abdomen.  Extremity exam showed no  clubbing, cyanosis, or edema.  Neurological exam showed the patient to  be alert and oriented x3.  Cranial nerves II through XII are intact, 5/5  strength in all extremities, and sensation intact to light touch  throughout.   White blood cell count was 11.1, hemoglobin of 12.5, platelet count of  254, ANC of 8.0, MCV of 87.9, sodium 137, potassium 3.1, chloride 100,  bicarb 28, BUN 12, creatinine 1.0, glucose 110, calcium 8.2.  Urinalysis  was within normal limits.  Urine drug screen was negative.  Point-of-  care cardiac markers were negative.   HOSPITAL COURSE:  1. Asthma exacerbation.  The patient was admitted to regular bed and      started on IV Solu-Medrol along with albuterol nebulizers.  IV      Avelox was also started because of her productive cough and concern      for possible bacterial bronchitis as an asthma trigger since she      admitted to having a sick contact at home.  The patient did not      have any fevers or any chest x-ray findings suggestive of other      acute pulmonary processes including pneumonia or pneumothorax.  The      patient had a normal EKG and negative point-of-care cardiac markers      on admission.  The patient's well score was low and pulmonary      embolism was thought unlikely to be the etiology of her dyspnea.      The patient maintained good oxygenation during her hospital course      except when she was sleeping when her O2 saturations would dip into      the high 80s.  A component of obstructive sleep apnea was thought      to be very probable in this patient; however, no formal sleep      studies were found when reviewing her records.  The patient may      benefit from further workup as an outpatient for this.  During her      hospital course, her cough improved and her  wheezing showed      progressive improvement.  It was noted however, that when the      patient was instructed to breathe through her nose instead of her  mouth, her lung exam much better.  When auscultating her neck, it      was apparent that majority of the patient's wheezing was coming      from her upper airways suggesting some component of vocal cord      stenosis or dysfunction.  During her hospital course, her IV      steroids were tapered and the patient switched to oral prednisone.      The patient was instructed to complete her full 7-day course of      Avelox upon discharge and continue with her albuterol and Advair.      By day of discharge, the patient was maintaining good oxygen      saturations and her blood and sputum cultures remained negative.  2. Urge urinary incontinence.  Although the patient has baseline      frequency of urination, urinalysis was checked upon admission,      which was found to be completely clean.  The patient was continued      on her home oxybutynin without problems.  3. Hypertension.  Initially, her home clonidine was held upon      admission; however, blood pressures continued to rise on hospital      day #2; therefore, her clonidine was restarted and blood pressures      quickly normalized by discharge.  4. Homelessness.  Social worker was consulted for helping the patient,      find adequate housing when she got discharged from the hospital.      She was found a place to stay by the time she was discharged and      provided with adequate material resources to help with her poor      social situation by the Education officer, museum.  5. Hypokalemia.  The patient's potassium was found to be low at 3.1      upon admission and this was most likely due to her repeated      albuterol nebulizers.  Magnesium was found to be within normal      limits and the patient was repleted with potassium supplements.  By      day of discharge, hypokalemia had  resolved.   DISCHARGED LABS AND VITALS:  On day of discharge, the patient had a  temperature of 98.1, blood pressure of 114/69, pulse of 67, respiratory  rate of 20, saturating.  She did not complain of any significant  headache during this hospitalization; however, she was 95% on room air.  The patient's white blood cell count was 18.4, hemoglobin 13.1, platelet  count 296, sodium 142, potassium 4.2, chloride 104, bicarb 30, BUN 19,  creatinine 1.2, glucose 118, calcium 8.8.      Stephanie Coup, MD  Electronically Signed      Thomes Lolling, M.D.  Electronically Signed    RK/MEDQ  D:  02/27/2008  T:  02/28/2008  Job:  [id number]

## 2010-08-06 NOTE — Discharge Summary (Signed)
NAMEMarland Vincent  Vincent, [name]             ACCOUNT NO.:  [id number]   MEDICAL RECORD NO.:  [id number]          PATIENT TYPE:  INP   LOCATION:  5118                         FACILITY:  [facility]   PHYSICIAN:  Katha Cabal, M.D.     DATE OF BIRTH:  01/09/1966   DATE OF ADMISSION:  09/01/2007  DATE OF DISCHARGE:  09/03/2007                               DISCHARGE SUMMARY   ATTENDING PHYSICIAN:  [name], MD   DISCHARGE DIAGNOSES:  Is as follows.  1. Asthma exacerbation.  2. Leukocytosis.  3. Hypertension.  4. Migraines.   DISCHARGE MEDICATIONS:  Include.  1. Doxycycline 100 mg 1 p.o. b.i.d. times 5 days.  2. Advair 250 - 50 mcg 1 puff b.i.d.  3. Prednisone 10 mg, the patient to take 5 pills on day 1, 4 pills on      day 2, 3 pills on day 3, 2 pills on day 4, 1 pill on day 5.  4. Clonidine 0.1 mg p.o. b.i.d.  5. Hydrochlorothiazide 25 mg p.o. daily.  6. Imitrex 50 mg 1 p.o. p.r.n. migraine.  7. Percocet 7.5 - 325 mg 1 p.o. q.12 h. p.r.n. pain.  8. Albuterol 90 mcg MDI 1-2 puffs q.6 h. as needed.   DISPOSITION:  The patient will return to Dr. [name] in the outpatient  clinic on September 09, 2007, at 10:45 a.m. at which time the patient's  breathing status should be checked up on.  The patient should also have  CBC and a B-MET.  Of note, the patient is also to go to the outpatient  clinic on Monday September 06, 2007, to have her PPD checked.   PROCEDURES:  Procedures in the hospital include.  1. Chest x-ray on September 01, 2007.  Impression, low lung volumes with      bibasilar atelectasis.  2. Chest x-ray September 01, 2007.  Impression, cardiac enlargement and      vascular congestion.  Low respiratory phase film.  There were no      consultations made during hospital visit.   CHIEF COMPLAINT:  Shortness of breath/headache.   HISTORY OF PRESENT ILLNESS:  The patient is a 45 year old homeless woman  with past medical history significant for asthma, hypertension, chronic  headaches, morbid obesity,  and overactive bladder presenting to the ED  with 1-day history of shortness of breath.  The patient reports that at  2:00 a.m. on day of admission she had some shortness of breath and cough  with yellow sputum.  She also endorse right-sided chest pain that is  pleuritic, sharp, 10/10 and is worse, 4/10 currently, associated with  breathing and cough and not associated with fevers, chills, nausea,  vomiting, and diaphoresis.  The patient also complains her chronic  headaches are bothering her.  The patient reports in personal, her  daughter has pneumonia.  No other complaints.   ALLERGIES:  Include IBUPROFEN which causes swelling and hives, ZITHROMAX  which causes swelling and hives.   For past medical history, home medications, substance history, social  history, family history, please see hospital charts.   PHYSICAL EXAM:  VITAL SIGNS:  Temperature  equals 97.7, blood pressure  147/88, pulse equals 82, respiratory rate equals 18, O2 sats 91% on room  air.  GENERAL:  The patient is in no acute distress, holding her head, talking  in full sentences, no accessory muscle usage, and unkempt.  EYES:  PERLA, EOMI, anicteric.  ENT: Moist mucous membranes, no erythema.  NECK:  Supple.  No lymphadenopathy, no carotid bruits.  RESPIRATORY:  Decreased breath sounds diffusely.  There is no wheezing,  rales or rhonchi at this time.  The patient is talking in full  sentences.  No accessory muscle usage and moving air well.  CVA:  S1, and S2, regular rate and rhythm, no murmurs, rubs or gallops.  GI:  Soft, obese, nontender, nondistended, positive bowel sounds.  Extremities:  No edema, no cyanosis.  GU:  No CVA tenderness.  SKIN:  No jaundice, no cyanosis.  Left cervical lymphadenopathy noted.  MUSCULOSKELETAL:  The patient is moving all 4 extremities.  NEURO:  Nonfocal, alert and oriented x3.  PSYCH:  Appropriate.   ADMISSION LABS:  Chest x-ray showed low lung volumes with bibasilar   atelectasis.  WBC 10.2, hemoglobin 12.8, hematocrit 39.0, MCV 86.7, RDW  14.6, ANC equal to 9.7, sodium equals 136, potassium equals 4.0,  chloride 102, bicarb 27, glucose 212, BUN 14, creatinine 0.99, total  bilirubin 0.7, alkaline phosphatase 57, AST 24, ALT 15, total protein  6.7, albumin 3.6, calcium 8.4, D-dimer 0.46.  PT equals 13.2, INR equals  1.0, PTT equals 30, BNP equals 126.  UA negative.  TSH equals 0.812,  cholestrol equals 1.71, triglycerides equals 52, ACL equals 46, LDL  equals 115, hemoglobin A1c equals 5.6.  cardiac enzymes were negative  x3.  Urine culture with insignificant growth.   HOSPITAL COURSE:  1. Asthma exacerbation.  The patient was admitted to a regular bed and      had negative BNP, EKG changes, strep pneumo and Legionella were all      negative.  The patient was put on albuterol/Atrovent nebulizers,      Solu-Medrol 125 mg IV b.i.d. which was progressed to prednisone      taper over the hospital stay.  The patient was also put on oxygen      via nasal cannula and given guaifenesin and Avelox.  Given her      history of yellow sputum, the patient remained afebrile throughout      the hospital stay; however, her WBCs increased which was likely      secondary to her steroid usage.  D-dimer was also normal during      hospital stay.  The patient remained stable throughout the hospital      stay with normal O2 sats.  At the time of discharge, she will be      given a 5-day course of doxycycline, prednisone taper, albuterol      inhaler, Advair, guaifenesin and a prednisone taper.  The patient      received Advair as well as albuterol MDI via the clinic.      Currently, social work is attempting to help the patient with other      medications.  The patient will follow-up in the outpatient clinic      with Dr. [name] to see if her asthma is better controlled given      starting of the Advair.  2. Leukocytosis.  Likely secondary to the patient's steroid  usage.      The patient remained afebrile throughout the hospital  stay and      presented with a normal WBC.  The patient will have a CBC checked      in the outpatient setting to see her white count has decreased      after the steroids have been stopped.  3. Hypertension.  The patient will be discharged on clonidine and      hydrochlorothiazide.  The patient's blood pressure was pretty well      controlled during hospital stay.  4. Migraines.  The patient was continued on her Percocet and Imitrex      p.r.n.   DISCHARGE LABS:  Include urine culture insignificant growth, sodium 140,  potassium 4.4, chloride 96, bicarb 35, glucose 111, BUN 18, creatinine  0.88 and a calcium of 8.6, WBC equals 16.3, hemoglobin 12.0, hematocrit  36.3, platelets 245.   DISCHARGE VITALS:  Temperature 98.0, pulse 97, respiratory rate 18,  blood pressure 118/62, O2 sats 96% on 2 liters.      Katha Cabal, M.D.  Electronically Signed     JY/MEDQ  D:  09/03/2007  T:  09/03/2007  Job:  [id number]   cc:   Dr. Judeen Hammans

## 2010-08-06 NOTE — Discharge Summary (Signed)
NAME:  Kara Vincent, Kara Vincent             ACCOUNT NO.:  [id number]   MEDICAL RECORD NO.:  [id number]          PATIENT TYPE:  INP   LOCATION:  5020                         FACILITY:  [facility]   PHYSICIAN:  Felicity Pellegrini, MD     DATE OF BIRTH:  Apr 04, 1965   DATE OF ADMISSION:  08/29/2008  DATE OF DISCHARGE:  08/30/2008                               DISCHARGE SUMMARY   DISCHARGE DIAGNOSES:  1. Worsening of chronic rash.  2. Asthma.  3. Hypertension.  4. Obesity.  5. Left knee pain secondary to osteoarthritis.   DISCHARGE MEDICATIONS:  1. Imitrex 50 mg take 1 tablet as needed for migraine headache, may      repeat dose in 2 hours if headache is not better, but cannot take      more than 2 pills per day.  2. Proventil HFA 108 mcg 1 puff every 4 hours as needed.  3. Clonidine 0.2 mg take 1 pill by mouth twice a day.  4. Benadryl 25 mg take 1 pill every 6 hours as needed for allergies.  5. Albuterol sulfate nebulizer use as required, no more than 4 times a      day.  6. Percocet 5/325 mg take 1 tablet by mouth once every 6 hours as      needed for pain.  7. triamcinolone acetonide 0.1% ointment placed in layer over rash      twice a day.  8. AmLactin placed thinly over rash twice a day.   CONSULTATION:  Sydnee Levans, MD, [facility].   ADMITTING HISTORY OF PRESENT ILLNESS:  A 45 year old woman with past  medical history of asthma and hypertension was admitted from clinic due  to worsening rash.  The patient reports that she has had a rash under  her breast for all her life, but rash started to spread in September  2009 and in the last month it has worsened.  In April 2009, skin biopsy  was done and recently she got a second biopsy.  The patient states it  got worse after taking Bactrim which she was taking 2 months ago and had  been treated with IV steroids, ketoconazole, and hydrocortisone cream  without much relief, associated with profound itching.  She states that  she  has not used new detergent.  The patient states to have been seen a  dermatologist in [facility], but diagnosis is unknown to her.   PHYSICAL EXAMINATION:  VITAL SIGNS:  Temperature 97.8, blood pressure  189/117, pulse 70, and respiratory rate 18, and O2 saturation 96% on  room air.  GENERAL:  NAD.  EYES:  EOMI, nonicteric sclerae.  ENT:  Moist mucous membranes, nonerythematous pharynx.  NECK:  Supple.  RESPIRATORY:  Clear to auscultation bilaterally.  No wheezes, rhonchi,  or crackles.  CARDIOVASCULAR:  Regular rate and rhythm.  No murmurs, rubs, or gallops.  GI:  Bowel sounds positive, soft and depressible, nontender, nontender,  no rebound, no guarding.  EXTREMITIES:  Trace edema bilaterally.  SKIN:  Diffuse papular erythematous rashes, especially around the neck  and upper chest.  LYMPH:  No lymphadenopathy.  MUSCULOSKELETAL:  No joint abnormality.  NEURO:  Alert and oriented x3, cranial nerves II-XII intact, 5/5  strength throughout, light touch sensation intact.   ADMISSION LABORATORY DATA:  Sodium 143, potassium 3.8, chloride 106,  bicarb 20, BUN 11, creatinine 0.97, and glucose 138.  White blood cells  7.3, hemoglobin 12.5, hematocrit 37.2, and platelets 241.   HOSPITAL COURSE:  1. 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  Dermatology was      consulted.  The patient had a skin biopsy done in the      hospitalization.  The patient was placed on triamcinolone,      emollient, and AmLactin.  On the second day of admission, the      patient was considered to be medically stable to be discharged.      The patient will follow up with Dr. Fontaine No, [facility]      [facility] on September 10 2008, at 10 a.m. at [facility].      The patient will follow up with Dr. Volanda Napoleon at [facility] on September 13, 2008, at 2 p.m.  Final results of culture will      be ready by the time the  patient will be seen at [facility].  We will follow up on recommendations from      dermatologist.  2. Asthma:  Stable throughout hospitalization.  The patient was      continued on home albuterol treatments, inhalers.  The patient      maintained O2 sat throughout hospitalization.  No signs of      exacerbation while the patient was here.  The patient was talking      in full sentences, not using accessory muscles.  We will continue      to monitor as an outpatient.  3. Hypertension:  Her blood pressure was somewhat elevated initially      upon admission.  Once the patient was started on antihypertensive,      it was well controlled.  The patient will continue home-dose      antihypertensives and follow up at [facility] for      further management.  4. Headache:  The patient's headache was controlled with Tylenol      during the hospitalization.  She will as an outpatient continue to      take Imitrex which she does for her headache.  The patient will      follow up at [facility] for followup.   DISCHARGE VITAL SIGNS:  Temperature 96.6, blood pressure 149/90, pulse  57, respiratory rate 20, and O2 saturation 94 on room air.   DISCHARGE LABORATORY DATA:  Sodium 139, potassium 4.3, chloride 104,  bicarb 29, BUN 11, creatinine 0.83, and glucose 134.  White blood cells  11.4, hemoglobin 12.8, hematocrit 38.4, and platelets 252.   PENDING:  Final results of skin occult biopsy.      Ludwig Lean, MD  Electronically Signed      Felicity Pellegrini, MD     RV/MEDQ  D:  09/04/2008  T:  09/05/2008  Job:  [id number]

## 2010-08-06 NOTE — Discharge Summary (Signed)
NAME:  Kara Vincent, Kara Vincent             ACCOUNT NO.:  [id number]   MEDICAL RECORD NO.:  [id number]          PATIENT TYPE:  INP   LOCATION:  5156                         FACILITY:  [facility]   PHYSICIAN:  Lucy Chris, MD     DATE OF BIRTH:  April 22, 1965   DATE OF ADMISSION:  03/22/2008  DATE OF DISCHARGE:  03/25/2008                               DISCHARGE SUMMARY   CONTINUITY DOCTOR:  Outpatient clinic.   CONSULTANTS:  None.   DISCHARGE DIAGNOSIS:  Asthma exacerbation.   OTHER DIAGNOSES:  1. Probable obstructive sleep apnea.  2. Hypertension.  3. Urge urinary incontinence.  4. Chronic dermatitis.  5. Osteoarthritis of the knee.  6. Obesity.  7. Chronic migraines.  8. Homeless.   DISCHARGE MEDICATIONS:  1. Lisinopril 2.5 mg once daily.  2. Singulair 10 mg, take once daily.  3. Albuterol 90 mcg 1 puff every 4 hours as needed for shortness of      breath.  4. Clonidine 0.2 mg 1 tablet twice a day.  5. Imitrex 50 mg as needed for migraine.  6. Benadryl 25 mg, take 1 every 6 hours as needed for allergy.  7. Ala-Cort 1% cream as directed.  8. Percocet 7.5/500 mg one tablet every 6 hours as needed for back      pain.  9. Oxybutynin 5 mg one tablet twice a day.  10.Advair 250 mcg take 1 puff twice a day.  11.Doxycycline 100 mg twice a day, take it until April 02, 2008.  12.Prednisone taper over 7 days.  13.Tessalon Perles 100 mg 1 tablet every 4 hours as needed for cough.  14.Robitussin 5-10 mL every 2 hours as needed for cough.   DISPOSITION ON FOLLOWUP:  Kara Vincent, she has an appointment  scheduled for March 27, 2008, at the outpatient clinic.   PROCEDURE PERFORMED:  None.   BRIEF HISTORY OF PRESENT ILLNESS:  Kara Vincent is a 45 year old female  with past medical history of asthma, morbidly obese, homeless woman, and  hypertension, who presented to the [facility] Emergency Department  complaining of dyspnea and difficulty speaking on the morning of  admission.  The  patient reports she was just finishing an outpatient  sleep study early this a.m. when she experienced worsening dyspnea.  She  was last hospitalized at [facility] on February 16, 2008, to  February 18, 2008, for asthma exacerbation.  Since her discharge, the  patient has visited ED on March 09, 2008, March 13, 2008, and  March 20, 2008.  She was given a prescription for doxycycline and  Ladona Ridgel on March 20, 2008, she presents still complaining of  shortness of breath.  She is complaining of increasing productive cough  and shortness of breath.  She denies any smoking and no pleuritic chest  pain.   PHYSICAL EXAMINATION:  VITAL SIGNS:  Oxygen saturation 96% on room air,  temperature 38.4, pulse 75, respiration 20, blood pressure 130/65.  GENERAL:  Morbidly obese middle-aged woman.  HEENT:  Mouth, oral mucosa and oropharynx without lesion or exudate.  Teeth in good condition.  LUNGS:  Normal respiratory effort.  No intercostal retraction.  No  accessory muscle use.  No fremitus.  No crackles.  Bilateral diffuse  wheezing.  HEART:  Normal S1 and S2.  Regular rhythm and rate.  PSYCH:  Oriented and memory intact.   ADMISSION LABORATORY DATA:  White blood cell 7.8, hemoglobin 11.8,  hematocrit 35.9, and platelets 235.  ANC 73%.  BMET; sodium 139,  potassium 3.8, chloride 102, CO2 31, glucose 103, BUN 15, creatinine  0.97, calcium 8.2, BNP 34.9.  Cardiac enzymes 0.01.   HOSPITAL COURSE:  1. Asthma exacerbation.  Her shortness of breath was likely secondary      to asthma exacerbation and she has bilateral wheezing.  Chest x-ray      was clear.  No infiltrate, so less likely pneumonia.  Chest x-ray      showed some mild peribronchial thickening, so she could have some      bronchitis component also.  The patient was started on IV      methylprednisolone.  We will continue the albuterol and Spiriva.      We will continue with doxycycline.  The patient was then       transitioned into p.o. prednisone.  Her lung exam improved.  She      did not have any wheezing.  She was doing better.  2. Hypertension.  We will continue with her clonidine and lisinopril.    The patient was discharged in good condition and on the day of  discharge, she was not having shortness of breath and lung sounds clear.  Blood pressure of 106/70, pulse 63, respirations 20, sat 94% on room  air, and temperature 96.9.  White blood cell 13, hemoglobin 12, hematocrit 37, and platelet 235.  Sodium 135, potassium 4.5, chloride 103, bicarb 31, BUN 22, creatinine  1.0, and glucose 104.   The patient was discharged in good condition. A list for shelter was  provided.     Niel Hummer, MD  Electronically Signed      Lucy Chris, MD  Electronically Signed   BR/MEDQ  D:  03/25/2008  T:  03/26/2008  Job:  [id number]

## 2010-08-06 NOTE — Discharge Summary (Signed)
NAME:  Kara Vincent, Kara Vincent             ACCOUNT NO.:  [id number]   MEDICAL RECORD NO.:  [id number]          PATIENT TYPE:  OBV   LOCATION:  3705                         FACILITY:  [facility]   PHYSICIAN:  C. Milta Deiters, M.D.DATE OF BIRTH:  04/08/1965   DATE OF ADMISSION:  05/10/2008  DATE OF DISCHARGE:  05/11/2008                               DISCHARGE SUMMARY   DISCHARGE DIAGNOSES:  1. Presyncope.  2. Homelessness.  3. History of medication noncompliance.  4. Asthma.  5. Hypertension.  6. Left knee pain secondary osteoarthritis.  7. Obesity.  8. Chronic rash.  9. Urinary urge incontinence.   DISCHARGE MEDICATIONS:  1. Imitrex 50 mg p.o. p.r.n. as needed for migraine.  2. Advair 500/50 mcg 1 puff twice daily.  3. Albuterol sulfate 1 puff q.6 h. p.r.n.  4. Clonidine 0.2 mg p.o. b.i.d.  5. Benadryl 25 mg p.o. q.6 h. p.r.n. for allergies.  6. Oxybutynin 5 mg p.o. b.i.d.  7. Singulair 10 mg p.o. once daily.  8. Lisinopril 5 mg p.o. once daily.  9. Mucinex 600 mg p.o. b.i.d.   DISPOSITION:  The patient is being discharged from [facility] on May 11, 2008.  She is to follow up in the [facility] with Dr. Dawna Part on May 31, 2008, at 1:30 in  the afternoon.  At that time, it will be important to assess whether the  patient has been compliant with her medications.  She does not have an  immediate need for laboratory studies at that visit.   PROCEDURES PERFORMED:  Head CT on May 10, 2008, shows negative  noncontrasted CT scan of the head.   CONSULTATIONS:  None.   BRIEF ADMITTING HISTORY AND PHYSICAL:  The patient is a 45 year old  woman with a history of homelessness, back pain, asthma, hypertension,  obstructive sleep apnea, and migraine who comes into the clinic with  complaint of blackouts.  The patient states she has had two episodes  of blacking out since the day prior to admission.  She stated that her  first episode  happened while she was standing up after using a bathroom  in a local [employer].  The patient said she fell to the floor and then  a person saw next to her came to help.  She stated that she felt very  weak and dizzy before falling, but on repeated questioning is unable to  say for certain if she lost consciousness.  Then the patient had another  similar episode at her place of stay on the day of admission when she  said she stood and attempted to walk and fell down again.  Again, she is  unable to state for sure whether she lost consciousness or not.  She  denies any seizure, incontinence, tongue biting, nausea, vomiting, or  diarrhea.  In addition to this, she endorsed generalized chest pain that  has been lasting for approximately the last couple of weeks.  She states  this is on the anterior chest and has no radiation and goes away on its  own.  She stated that has  no precipitating factors and that the chest  pain is not associated with exertion.   PHYSICAL EXAMINATION:  VITAL SIGNS:  Blood pressure standing 120/60 with  a pulse of 65, saturating 95% on room air, temperature 97.3, the  patient's weight is 387.5 pounds.  GENERAL:  The patient is an obese female who appears stated age.  She is  in no acute distress at the time of exam.  HEENT:  Atraumatic and normocephalic.  Pupils equal and reactive to  light and accommodation.  No scleral icterus.  Mouth moist mucous  membranes.  No erythema or exudates.  LUNGS:  Clear to auscultation.  CARDIOVASCULAR:  Regular rate and rhythm.  No murmurs, rubs, or gallops.  ABDOMEN:  Obese, soft, nontender, and nondistended.  Normal bowel  sounds.  EXTREMITIES:  No evidence of edema.  NEUROLOGIC:  No focal deficits.  SKIN:  Somewhat excoriated rash on her back.  PSYCHIATRIC:  Appropriate.   LABORATORY DATA:  White blood cell 8.9, hemoglobin 12.1, hematocrit  35.5, and platelets 197.  Sodium 141, potassium 3.8, chloride 104,  bicarb 25, BUN  12, creatinine 1.07, glucose of 108.  Urine drug screen  is negative.  Initial set of cardiac enzymes was within normal limits.   HOSPITAL COURSE:  1. Presyncope and fall.  The patient was admitted to the [facility] for evaluation of her presyncopal episodes as well      as her fall.  The patient is well known to the teaching service.      She has a prior history of multiple falls secondary to      osteoarthritis of her knees and large body habitus.  However, she      has only had one prior complaint of syncope, which was in November      2009.  For this part of her workup, she was admitted to the      telemetry floor and monitored for any arrhythmias.  The patient had      no episodes of arrhythmias throughout her hospitalization.  In      addition, she obtained serial EKGs to rule out acute coronary      syndrome.  All EKGs were within normal limits.  Her cardiac enzymes      were cycled x3 and found to be within normal limits.  The patient      also had a noncontrasted head CT, which did not show any acute      intracranial abnormalities.  The primary team discussed about      ordering 2D echocardiogram to assess her cardiac anatomy; however,      due to her large body habitus, the decision was made to forego that      test at this time.  The patient did not endorse any chest pain      throughout her hospitalization.  In addition, she did not have any      other episodes of headache, lightheadedness, dizziness, or      presyncopal spells.  In addition, the patient had orthostatic vital      signs taken throughout and she was not found to be orthostatic.      Her symptoms are likely vasovagal in origin or related possibly to      a micturition syncope.  2. Chest pain.  As mentioned in #1, the patient was admitted to a      telemetry unit to be evaluated.  Subsequently,  she was worked up      for acute coronary syndrome and she ruled out.  Her testing include       serial EKGs, acyclovir of her cardiac enzymes.  We did not attempt      a D-dimer secondary to the patient's morbid obesity.  As mentioned,      she did not endorse any chest pain and was in improved and stable      condition at the time of discharge.  3. Asthma.  The patient was provided with albuterol and Atrovent nebs      throughout her hospitalization and she was also provided with her      home dose of Singulair and Benadryl for her allergies.  She did not      endorse any shortness of breath throughout her hospitalization.  4. Hypertension.  The patient was maintained on her home doses of      clonidine and lisinopril.  Her blood pressure was well controlled      throughout her hospitalization.  5. Obesity.  The patient continuously requested double portions of      food throughout her stay in the hospital.  However, she was put on      a low sodium heart healthy diet.  The primary team spoke with her      at length about making wise decisions when it comes to her diet and      increasing her physical activity level.  The patient was agreeable.   DISCHARGED LABORATORIES:  Sodium 141, potassium 3.8, chloride 102,  bicarb 31, BUN 12, creatinine 0.97, and glucose 104.   DISCHARGE VITAL SIGNS:  Temperature 98.0, blood pressure 110/77, pulse  81, and the patient was saturating 98% on room air.   The patient was discharged back to her shelter in stable and improved  condition.      Kathlynn Grate, MD  Electronically Signed      C. Milta Deiters, M.D.  Electronically Signed    ZF/MEDQ  D:  05/11/2008  T:  05/12/2008  Job:  [id number]   cc:   Junius Finner, MD

## 2010-08-06 NOTE — Discharge Summary (Signed)
NAMEMarland Kitchen  Kara Vincent, Kara Vincent             ACCOUNT NO.:  [id number]   MEDICAL RECORD NO.:  [id number]          PATIENT TYPE:  INP   LOCATION:  5529                         FACILITY:  [facility]   PHYSICIAN:  Thomes Lolling, M.D.    DATE OF BIRTH:  07-31-1965   DATE OF ADMISSION:  10/06/2008  DATE OF DISCHARGE:  10/11/2008                               DISCHARGE SUMMARY   DISCHARGE DIAGNOSES:  1. Asthma exacerbation.  2. Mild acute renal insufficiency, resolved.   OTHER INACTIVE DIAGNOSES:  1. Chronic rash.  2. Hypertension.  3. Obesity.  4. Osteoarthritis.   DISCHARGE MEDICATIONS:  1. Imitrex 50 mg 1 pill p.o. p.r.n. for headache, can be put in 2      hours but limit to no more than 2 pills per day.  2. Albuterol MDI 90 mcg 2 puffs q.4 hourly p.r.n. for shortness of      breath.  3. Clonidine 0.2 mg p.o. b.i.d.  4. Benadryl 25 mg p.o. q.6 hourly p.r.n. for allergy.  5. Albuterol and Atrovent nebulizers if albuterol MDI does not help      with shortness of breath p.r.n.  6. Percocet 5/325 one pill p.o. q.6 hourly p.r.n. for pain.  7. Triamcinolone 0.1% ointment apply twice daily over the rash.  8. AmLactin lotion apply over the rash twice daily.  9. Prednisone on tapering dose.  10.Advair 100/50 mcg 1 puff b.i.d.   DISPOSITION AND FOLLOWUP:  Kara Vincent is discharged home.  She will  follow up with Dr. Wendee Beavers on October 30, 2008 at [facility].  Followup issues include followup on any residual symptoms.  The  patient may need PFT on followup.  Please also assess for the access to  medication.   CONSULTATION DONE DURING THIS HOSPITALIZATION:  None.   PROCEDURES:  Chest x-ray done on October 06, 2008 is positive for mild  cardiomegaly, low lung volumes, and suspected mild bibasilar  subsegmental atelectasis.   BRIEF HISTORY OF PRESENT ILLNESS:  Kara Vincent is a 45 year old female  with a past medical history of asthma, morbid obesity, and hypertension  who presented  with worsening shortness of breath for 2-3 days prior to  admission.  The patient stated increased use of her albuterol inhaler to  every 4 hour from her usual every 6 hours.  She also complained of  bilateral ear pain, itchy eyes, and productive cough for 3 days prior to  admission that did not improve with over-the-counter ear drops, Benadryl  cough syrup.  She denies fever, chills, nausea, vomiting, or diarrhea.  She denies any sick contact.   PHYSICAL EXAMINATION:  VITAL SIGNS:  Temperature 98.3, pulse 86, blood  pressure 155/92, respirations 22, and oxygen saturation 94 on room air.  GENERAL:  She is morbidly obese, disheveled in mild respiratory  distress, but is alert and cooperative.  CHEST:  Tachypneic.  No accessory muscles used.  Diffuse bilateral  expiratory wheezing.  No rhonchi or crackles.  Movement.  ABDOMEN:  Soft.  Normal bowel sounds.  No guarding.  No rebound  tenderness.  No hepatomegaly.  No splenomegaly.  Mild tender to  palpation in left upper quadrant and left lower quadrant.  CARDIOVASCULAR:  Distant heart sounds secondary to body habitus.  Normal  rate and regular rate.  No murmurs, rubs, or gallops.  NEUROLOGIC:  Grossly nonfocal.   LABORATORY DATA:  On the day of admission, white count 10.3, hemoglobin  14.1, and platelets 243.  Sodium 140, potassium 4.7, chloride 102,  bicarb 27, glucose 186, BUN 16, and creatinine 1.24.  Liver function all  within normal limits.   HOSPITAL COURSE:  1. Asthma exacerbation.  The patient was admitted to regular bed and      was given some systemic steroids and started on nebulizers.  The      patient started to improve gradually and was not requiring any      oxygen.  Her oxygen saturation was well maintained.  The primary      team decided to taper the prednisone and on the day of discharge      she is discharged on tapering dose of prednisone.  Please note that      Advair was started at this admission.  The patient was  also given a      supply of home nebulizers at discharge.  2. Mild renal insufficiency.  When the patient came, she had a      creatinine of 1.24 which resolved with some gentle hydration to 1.2      and remained within normal limits.   CONDITION ON DISCHARGE:  The patient ambulating in the hall without any  oxygen desaturation.  The patient denies any shortness of breath.   DISCHARGE VITAL SIGNS:  Temperature 97.8, blood pressure 147/86, pulse  84, respirations 20, and oxygen saturation 95 on room air.      Flo Shanks, MD  Electronically Signed      Thomes Lolling, M.D.  Electronically Signed    YP/MEDQ  D:  10/13/2008  T:  10/14/2008  Job:  [id number]

## 2010-08-07 NOTE — Assessment & Plan Note (Signed)
Her numbness and tingling is related to positioning and her body habitus causing compression of her sciatic nerve.  We will have her pick up a pad to sit on in the bathtub and to ensure that she is moving in her chair.

## 2010-08-07 NOTE — Assessment & Plan Note (Signed)
No indications of acute ear infection.  She has been irritating it with the bobby pin trying to clean it out.  With her underlying skin disease the skin surface in her ear canal would be exactly the same as anywhere else so the pain she is having is likely from the scratching and itching when she tries to clean it.  We will have her use tylenol for pain and make sure that she doesn't use object to clean her ear.  There was no wax build up that I could appreciate.  If she does feel that there is a build up in there or water problems in her ears she can use a 50/50 mix of vinegar and rubbing alcohol drops that she can make over the counter.  If she continues to have problems we can use prescription ear drops.

## 2010-08-07 NOTE — Assessment & Plan Note (Signed)
She has had problems sleeping lately.  She has not been taking the [place] but has been taking her Trazodone daily but states that it is not helping.  After discuss she will increase her trazodone to 100 mg nightly and see if that helps her sleep better.

## 2010-08-07 NOTE — Assessment & Plan Note (Signed)
She is stable in terms of her chronic skin condition.  Her previous abscess site is healing well.  She should continue all her current skin treatments.  She did follow up with her Dermatologist and will continue to follow up with him.

## 2010-08-07 NOTE — Assessment & Plan Note (Signed)
She has wheezing on exam today.  She states that she has been taking her medications as prescribed but has been having use her albuterol every day sometimes multiple times daily.  She is currently on Advair 100/50 and albuterol.  She initial stated that she took all her medications today but afterwards admitted that she hadn't taken her advair today.  She was given an albuterol nebulizer in the office and her O2 saturations were in the mid 90s and the wheeze had resolved.  Since she has been using her albuterol every day we will increase her controller medication by changing her Advair to 250/50 1 puff BID and see her back in follow up to ensure that she is feeling better.

## 2010-08-09 ENCOUNTER — Other Ambulatory Visit: Payer: Self-pay | Admitting: *Deleted

## 2010-08-09 DIAGNOSIS — Q828 Other specified congenital malformations of skin: Secondary | ICD-10-CM

## 2010-08-09 NOTE — H&P (Signed)
NAME:  Kara Vincent, Kara Vincent                         ACCOUNT NO.:  [id number]   MEDICAL RECORD NO.:  [id number]                   PATIENT TYPE:  INP   LOCATION:                                       FACILITY:  [facility]   PHYSICIAN:  Cyril Mourning, D.O.                 DATE OF BIRTH:  May 30, 1965   DATE OF ADMISSION:  01/20/2003  DATE OF DISCHARGE:  01/24/2003                                HISTORY & PHYSICAL   HEADER INFORMATION NOT AUDIBLE:   PRIMARY CARE PHYSICIAN:  Currently, the patient at this time has no primary  care physician.   CHIEF COMPLAINT:  Shortness of breath and wheezing.   HISTORY OF PRESENTING ILLNESS:  [age]-year-old female recently  incarcerated and released on January 08, 2003.  The patient had complaint of  persistent cough, yellow productive sputum exacerbated by a head cold.  She  states that this has been persistent for the last three to four days and has  been using her metered-dose inhaler every two to four hours without relief.  She has had asthma since childhood, no prior intubations or hospitalizations  for this.  She has been on nebulizers in the past.  She is a nonsmoker.  She  denies any other medical problems including hypertension or diabetes.   PAST MEDICAL HISTORY:  Denies hypertension.  Denies diabetes.  Denies  coronary artery disease.  She is morbidly obese and has a history of asthma  since childhood.   PAST SURGICAL HISTORY:  Cesarean sections.   MEDICATIONS AT HOME:  Albuterol metered-dose inhaler and hydroxyzine for  itching.   ALLERGIES:  The patient states she developed hives with ZITHROMAX.  IBUPROFEN she has decreased tolerance to secondary to increased heart rate  and development of headache.   FAMILY HISTORY:  She states that her mother died at age 46 from asthma.  Her  father is currently alive at age 78 with diabetes and heart problems.   SOCIAL HISTORY:  She denies tobacco.  She has two children.  She is  currently  applying for disability, recently incarcerated.   REVIEW OF SYSTEMS:  The patient denies any other complaints.  She denies  weight loss, fevers, chills, night sweats.  She denies chest pain.  She does  complain about shortness of breath with cough but no hemoptysis,  hematochezia, melena or coffee-grounds emesis.  No abdominal pain.  No  dysuria.  She reports dependent swelling in lower extremities but no  constant calf pain.   PHYSICAL EXAMINATION:  VITAL SIGNS:  Blood pressure 113/70, temperature  97.8, heart rate 84, respirations 22, sats 60%.  GENERAL:  The patient is awake, alert and in no acute distress.  She does  have an erythematous rash about her upper trunk and arms; she states that  this appeared secondary to use of institutional soap during her detainment.  NECK:  Neck supple and nontender.  No palpable mass.  CHEST:  Chest revealed tight wheezes bilaterally.  S1 and S2 are regular  with no murmurs, rubs, or gallops.  ABDOMEN:  Abdomen was obese and nontender.  EXTREMITIES:  Extremities revealed no edema, tenderness or clubbing.  Peripheral pulses were present and symmetrical.   LABORATORIES:  WBC was 9.1, hemoglobin 11.4, hematocrit 33, MCV 86, platelet  count 221,000.  Sodium 137, potassium 3.8, chloride 103, CO2 30, glucose  ____ , BUN 19, creatinine 0.9, calcium 8.4.   Chest x-ray revealed no acute disease.   IMPRESSION AND PLAN:  Asthma with asthmatic exacerbation.  We are going to  admit her to a non-telemetry floor, start her on intravenous Solu-Medrol and  doxycycline with frequent nebulizer treatments and social services to assist  with medications and discharge planning.  We are also going to follow her  pretty close throughout the hospitalization.                                                Cyril Mourning, D.O.    ESS/MEDQ  D:  01/20/2003  T:  01/20/2003  Job:  [id number]

## 2010-08-09 NOTE — Discharge Summary (Signed)
NAMEMarland Kitchen  Kara, Vincent             ACCOUNT NO.:  [id number]   MEDICAL RECORD NO.:  [id number]          PATIENT TYPE:  INP   LOCATION:  5523                         FACILITY:  [facility]   PHYSICIAN:  Deborra Medina, M.D.     DATE OF BIRTH:  [birth year]   DATE OF ADMISSION:  05/04/2006  DATE OF DISCHARGE:  05/08/2006                               DISCHARGE SUMMARY   ATTENDING PHYSICIAN:  Dr. Deborra Medina.   DISCHARGE DIAGNOSES:  1. Acute asthma exacerbation.  2. Acute bronchitis.  3. Influenza A.  4. Hypertension.  5. 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  6. Migraine headaches.  7. Status post 2 times cesarean section.  8. History of hospitalization for asthma exacerbation in 2004, never      been intubated.   DISCHARGE MEDICATIONS:  1. Hydrochlorothiazide 25 mg p.o. every day.  2. Flovent 2 puffs inhaled b.i.d.  3. Indocin 50 mg p.o. p.r.n. pain.  4. Albuterol inhaler 2 puffs inhaled only p.r.n. shortness of breath.  5. Sarna lotion apply topically as needed over rash.  6. Benadryl 25 mg p.o. 1-2 times each day p.r.n. itching.  7. Prednisone 60 mg p.o. every day x2 days, 50 mg p.o. every day x2      days, 40 mg p.o. every day x2 days, 30 mg p.o. every day x2 days,      20 mg p.o. every day x2 days, 10 mg p.o. every day x2 days, then      stop.  8. Doxycycline 100 mg p.o. b.i.d. x10 days.  9. Vicodin 5/500 one tab p.o. q.h.s. p.r.n. x10 days.  Of note, the patient was given samples of hydrochlorothiazide, 2  prednisone taper packs, as well as doxycycline from our clinic, given  her financial constraints.   DISPOSITION AND FOLLOWUP:  The patient will follow up with Dr. Caprice Kluver in the outpatient clinic on May 14, 2006 at 2:30 p.m.  At  that point, Dr. Posey Pronto can assess her for full resolution of her acute  asthma exacerbation as well as her acute bronchitis.  The patient should  also have a thorough pulmonary exam.  Of note, the patient frequently  has upper airway noise secondary to volitional wheezing through her  vocal cords, and the patient should be instructed to open her mouth  completely, take breaths in and out without using her vocal cords to  fully assess for the presence of wheeze.  Also, Dr. Posey Pronto can assess her  contact dermatitis versus nummular dermatitis, which apparently has been  assessed before in the outpatient clinic with biopsy.   PROCEDURES PERFORMED:  Chest x-ray, May 04, 2006 revealed mild  cardiomegaly with no active lung disease.   CONSULTATIONS:  None.   BRIEF ADMISSION HISTORY AND PHYSICAL:  Kara Vincent is a 45 year old  white woman with a history of asthma, who presents with shortness of  breath and wheezing.  The symptoms have been going on for the last 2-3  days and have gotten progressively worse.  She has an associated cough  productive of yellowish sputum.  She denies fever,  but does endorse  chills.  She denies any sick contacts.  She has not received a flu shot,  nor has she ever received a Pneumovax.  She denies nausea, vomiting, and  diarrhea, but does endorse abdominal pain and some urinary frequency.  She does have some dyspnea on exertion and pleuritic chest pain for 3  days as well.  She does not have a history of community-acquired  pneumonia.  She is homeless and states that the cold air has made her  asthma acutely worse over the last several days since she has been  outside chronically.  She can only go to the [employer] at  night from 5:30 p.m. to 5:30 a.m.   ALLERGIES:  ALLERGIES TO IBUPROFEN, WHICH CAUSES A RASH.  AZITHROMYCIN,  WHICH CAUSES A RAPID HEART RATE AND RASH.  FOR FURTHER DETAILS, PLEASE  SEE THE CHART.   PHYSICAL EXAMINATION:  Admission physical exam:  VITAL SIGNS:  Her temperature was 97.3, blood pressure 123/54, pulse of  102, respiratory rate of 24, O2 sat of 93% on room air.  GENERAL:  She is alert and oriented x3 in no apparent distress.   HEENT:  Eyes:  The extraocular muscles are intact.  No scleral icterus.  Neck was supple, no lymphadenopathy.  RESPIRATORY:  Bilateral expiratory wheeze on forced expiration.  CARDIOVASCULAR:  Regular rate and rhythm, no murmurs, rubs, or gallops.  ABDOMEN:  Obese, soft, nontender.  She had active bowel sounds.  EXTREMITIES:  No clubbing, cyanosis, or edema.  SKIN:  She has a diffuse macular rash across the upper abdomen and upper  anterior thorax and chest, under her breasts, bilateral arms, and hands,  with signs of recent excoriation.  MUSCULOSKELETAL:  She is moving all extremities equally.  NEUROLOGICAL:  Nonfocal with no deficits in strength and sensation.  PSYCHIATRIC:  Appropriate.   LABORATORY DATA:  Admission labs:  Sodium 134, potassium 4.1, chloride  100, bicarb 32, BUN 24, creatinine 1.2, glucose 92.  White blood cell  count 9.1, hemoglobin 13, platelets 249, ANC 7.3, MCV of 86.   The patient received in the ED albuterol and Atrovent nebulizers and  prednisone and Tylenol.   HOSPITAL COURSE:  1. Asthma exacerbation.  Given the patient's symptoms as well as her      noticeable wheezing on exam, she was admitted for IV      bronchodilation nebs, as well as IV steroids as well as antibiotics      for question of superimposed infection.  The patient responded to      these during the course of her hospitalization and symptomatically,      she improved.  Of note, her exam was quite difficult to fully      appreciate, secondary to some volitional wheezing from upper airway      and vocal cord use.  When the patient was instructed to just      breathe normally with an open mouth, her lungs sounded entirely      clear on the 2 days prior to discharge.  We assessed the patient as      well with peak flows pre- and post-bronchodilator, and they were      never that great.  However, they continued to show improvement     throughout the final 72 hours of her hospital admission.  On  the      day prior to discharge, she maxed out at a peak expiratory flow of  220.  Respiratory had signed off by this time, and we felt      comfortable discharging the patient with a slow taper of oral      steroids.  Ideally, we would treat her with fluoroquinolone.      However, given her significant financial constraints, we provided      her with doxycycline samples from our clinic.  2. Influenza.  The patient had a rapid influenza swab nasal specimen      which came back positive for influenza A.  I believe this has      probably set off her asthma exacerbation, secondary to pulmonary      inflammation and irritation.  The patient was notably symptomatic      and felt achy and only on the day prior to discharge did she      finally report feeling somewhat better.  3. Diffuse dermatitis.  The patient has been evaluated in an      outpatient clinic and had a biopsy which revealed changes      consistent with either a contact dermatitis or nummular dermatitis.      At this point, the patient has been treating herself with p.r.n.      Sarna lotion as well as Benadryl for itch.  The rash is still      present, however, and the patient reports that it is constant and      has not really changed in nature.  I will refer her to a primary      care Jazmyne Beauchesne in the clinic regarding this.  She has also been      treating herself with a p.r.n. topical steroid cream, of which she      does not have in the hospital and cannot remember the name of.  She      has been instructed to continue care as directed by her physician.  4. Hypertension.  Blood pressure has previously been controlled with      hydrochlorothiazide.  For this, we have continued her home      medication as well as provided her with another month's sample of      30 pills.  5. Homelessness.  This represents a significant barrier for her      receiving adequate care.  Again, we have discharged the patient      with samples of  doxycycline, prednisone, hydrochlorothiazide.  She      wanted a cough suppressant, such as Tussionex.  However, after we      told her how much it would cost, she opted for a low-dose Vicodin      5/500 to use at night to suppress her cough so she could sleep.  I      have given her a prescription filled out for 10 tabs, which      ideally, I think will be between $11-12.00.  On the day of      discharge, the patient is either going to return to the [employer] or use money that her father is providing her for a      motel.  I really would recommend this patient actually just go and      stay with her father while she is homeless, as exposure to the      elements certainly does not help her pulmonary disease, especially      if cold has been a trigger in the past.  The primary team has made      every effort to accommodate her, but really, this is more of a      social illness rather than medical. 6. Migraine headache.  The patient was treated with p.r.n. Tylenol,      which sometimes she said did not help.  I gave her, at least one      occasion, p.r.n. Ultram, which had kind of an equivocal effect on      her headache.   On May 07, 2006, her temperature was 98.5, blood pressure 148/98,  heart rate of 57-78, respiratory rate 20, O2 sats of 95-97% on room air.  Labs showed a white count of 18.6, hemoglobin of 13.3, platelets of 254,  sodium of 138, potassium 3.8, chloride 98, bicarb 35, BUN 21, creatinine  0.82, glucose of 103.  She had a urine final culture which revealed  100,000 colonies of multiple morphotypes, none of which were  predominant.  Her leukocytosis, I believe, was secondary to IV Solu-  Medrol.   DISPOSITION AND DISCHARGE:  Again, the patient is satting on room air  from 95-100%.  Her peak flows have maxed at about 220.  Clinically, she  is much improved and entirely stable for discharge.      Katy Apo, M.D.  Electronically  Signed      Deborra Medina, M.D.  Electronically Signed    GL/MEDQ  D:  05/07/2006  T:  05/08/2006  Job:  [id number]

## 2010-08-09 NOTE — Discharge Summary (Signed)
NAMEMarland Kitchen  Kara, Vincent                         ACCOUNT NO.:  [id number]   MEDICAL RECORD NO.:  [id number]                   PATIENT TYPE:  INP   LOCATION:  [id number]                                 FACILITY:  [facility]   PHYSICIAN:  Rexene Alberts, M.D.                 DATE OF BIRTH:  [birth year]   DATE OF ADMISSION:  01/20/2003  DATE OF DISCHARGE:  01/24/2003                                 DISCHARGE SUMMARY   DISCHARGE DIAGNOSES:  1. Asthma exacerbation.  2. Steroid induced hyperglycemia.  3. Leukocytosis, thought to be secondary to Solu-Medrol.  4. Bilateral heel pain with history of heel spurs.  5. Obesity.   DISCHARGE MEDICATIONS:  1. Combivent MDI 2 puffs q.i.d. and then 2 puffs q.2h. p.r.n. shortness of     breath.  2. Doxycycline 100 mg 1 pill b.i.d.  3. Prednisone dose taper 10 mg/12 day course.  4. Vicodin 5 mg q.8h. p.r.n. pain.  5. Ibuprofen or Tylenol Extra Strength q.4h. p.r.n. pain.   DISPOSITION:  Kara Vincent was discharged to home on January 24, 2003, in  improved and stable condition. She was advised to follow up with the  [facility] on January 25, 2003. She received information from the  Education officer, museum, case manager regarding follow up with [facility] to apply  for services there.   HISTORY OF PRESENT ILLNESS:  Kara Vincent is a 45 year old woman with a  past medical history significant for asthma who presented to the emergency  department on January 20, 2003, with a 2 to 3 day history of shortness of  breath, increase in wheezing, subjective fever, sore throat and runny nose.  The patient also had some chills. She denied any diarrhea, nausea or  vomiting. When she was evaluated in the emergency department the chest x-ray  revealed  no acute disease. The patient was given several nebulizer  treatments in the emergency department without any resolution of her  symptoms. She was also given magnesium sulfate in the emergency room without  much relief. The  patient was therefore  admitted for treatment of a presumed  asthma exacerbation.   HOSPITAL COURSE:  PROBLEM #1, ASTHMA EXACERBATION:  As stated above the  initial treatment included albuterol nebulizers along with Atrovent  nebulizers, prednisone, 60 mg p.o. x1 and magnesium sulfate 2 gm IV x1. The  patient was admitted to a regular floor. Treatment continued with Solu-  Medrol 60 mg IV q.6h., albuterol nebulizers q.4h. and empiric treatment with  doxycycline 100 mg p.o. b.i.d. In addition peak flow measurements were  obtained before and after the nebulizer treatments.   The following  day the patient was also treated with Advair discus 100/50 mg  1 inhaltion b.i.d. along with Robitussin DM 2 tsp t.i.d. as well as Tessalon  Pearls 100 mg b.i.d. Over the hospital course the patient became less  symptomatic. She had fewer complaints of shortness of breath on  exertion and  wheezing. Her cough began to resolve slowly over the hospital course.  However, she still  had a cough at hospital  discharge.   During the hospitalization, the patient at times refused nebulizer  treatments and at times refused the Advair treatment. Several peak flows  were obtained during the hospital course. The peak flows before the  nebulizer treatments generally ranged between 150 and 200, and the peak  flows following  the nebulizer treatments generally ranged between 200 and  225. She had 1 peak flow following  the nebulizer treatment as high as 480.   The patient still  had a few wheezes on examination  on the day of hospital  discharge. The patient started to refuse the frequent nebulizer treatments.  Therefore, the management  was changed to the Combivent inhaler 2 puffs  q.i.d. The patient was advised to continue outpatient treatment with  prednisone, specifically the prednisone dose taper, 10 mg 12 day course dose  pack.   The patient was concerned about her elevated blood sugars during the  hospital  course with treatment with Solu-Medrol. She refused Solu-Medrol at  least once during the hospital course secondary to the elevated blood  sugars. It was explained to the patient that the common side-effect of Solu-  Medrol is elevated blood sugars and that the insulin would help to correct  this. At times the patient also refused insulin therapy for her blood sugars  over 150.   She was quite apprehensive about going home on the prednisone dose taper,  however, I advised the patient to not stop steroid therapy abruptly and I  explained that this was because of possible adrenal insufficiency. She said  that she understood. The patient also will continue empiric treatment with  doxycycline 100 mg b.i.d. for an additional 5 days.   The case manager/social worker was consulted to assist with the patient's  medication, given that she had no additional financial resources per her  history. The patient currently lives with some friends. She has seen  physicians at [facility] in the past, however, she had not seen any  physicians recently. She recently moved from [place], [place]. The  patient did receive medications from the indigent fund that is available at  the hospital. She was advised to follow up with the [facility] for  recertification the day following  discharge.   PROBLEM #2, STEROID INDUCED HYPERGLYCEMIA:  As stated above the patient's  blood sugars were monitored q. a.c. and q. h.s. secondary to the potential  elevated blood sugars secondary to the Solu-Medrol treatment. Her capillary  blood sugars ranged from 126 to 195 with the range being approximately 150.  She was treated with a sliding-scale regular insulin regimen. As stated  above, she refused insulin injections several times during the  hospitalization. The patient's diet was therefore changed from a regular diet to a carbohydrate modified diet. She was also treated with gentle  volume repletion with  half normal saline.   The importance of continuing prednisone therapy was emphasized to the  patient. I also explained that the patient's blood sugars were probably  going to normalize after the prednisone dose taper is completed. I also  advised her to avoid empty calories and concentrated sweets in the  outpatient setting. I also encouraged her to walk for weight loss.   PROBLEM #3, LEUKOCYTOSIS SECONDARY TO SOLU-MEDROL:  The patient's white  blood cell count was 9.1 on admission. Following  the prednisone and the  Solu-Medrol treatment, the white blood cell count got as high as 19,000. The  increase  was thought to be secondary to the Solu-Medrol. The patient did  have some mild upper respiratory infection symptoms, however, on  examination, there was nothing acute.   Prior to hospital  discharge  the white blood cell count did decrease to  12.6. She was treated with doxycycline 100 mg b.i.d. empirically during the  hospital course. This regimen will continue for an additional 5 days after  discharge.   PROBLEM #4, BILATERAL HEEL PAIN:  The patient complained of bilateral heel  pain during the hospital course, particularly when she was ambulating.  Apparently  she has a diagnosis of bilateral heel spurs in the past. She was  treated with Vicodin 5 mg q.4h. p.r.n. pain as well as Tylenol  as needed  for pain. She was treated with the  Solu-Medrol for asthma which potentially would help with the bilateral heel  pain. The patient was advised to lose weight which would help with relieving  pressure on her heels. She was sent home with a 4 day course of Vicodin 5 mg  q.8h. p.r.n. pain.                                                Rexene Alberts, M.D.    DF/MEDQ  D:  01/28/2003  T:  01/28/2003  Job:  [id number]   cc:   [facility]

## 2010-08-15 ENCOUNTER — Telehealth: Payer: Self-pay | Admitting: *Deleted

## 2010-08-15 NOTE — Telephone Encounter (Signed)
Approved till 08/11/12 by [facility] Medicaid - RA/RR pharmacy and pt aware of approval. Joellen Tullos RN 08/15/10 10AM

## 2010-08-17 IMAGING — CR DG CHEST 2V
2 series · 2 of 2 positions shown · non-contrast
Comparison: 10/23/2007.

CLINICAL DATA: Chest pain.  Syncope.  History of asthma and
hypertension.

CHEST - 2 VIEW

[w chest pa *]
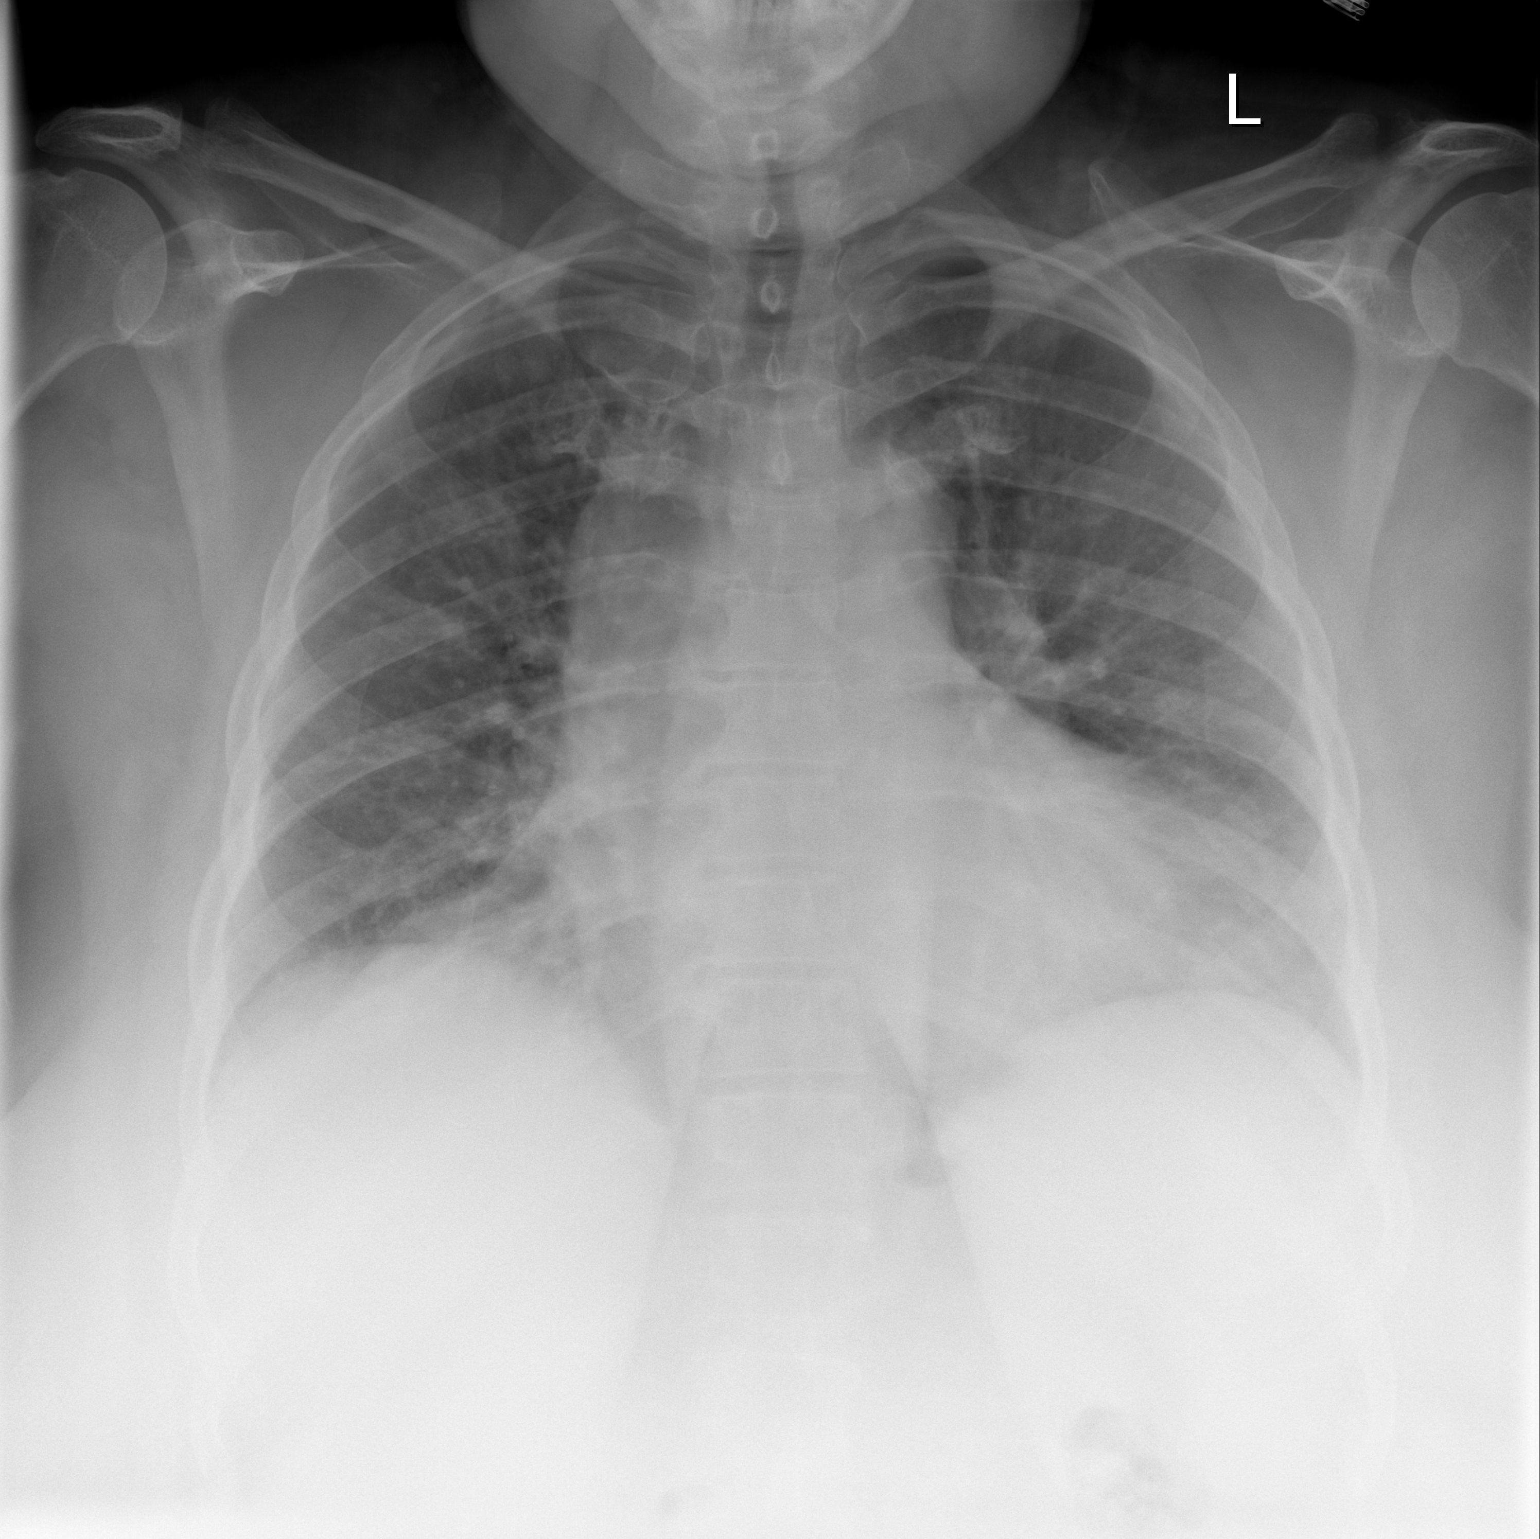

[w chest lat]
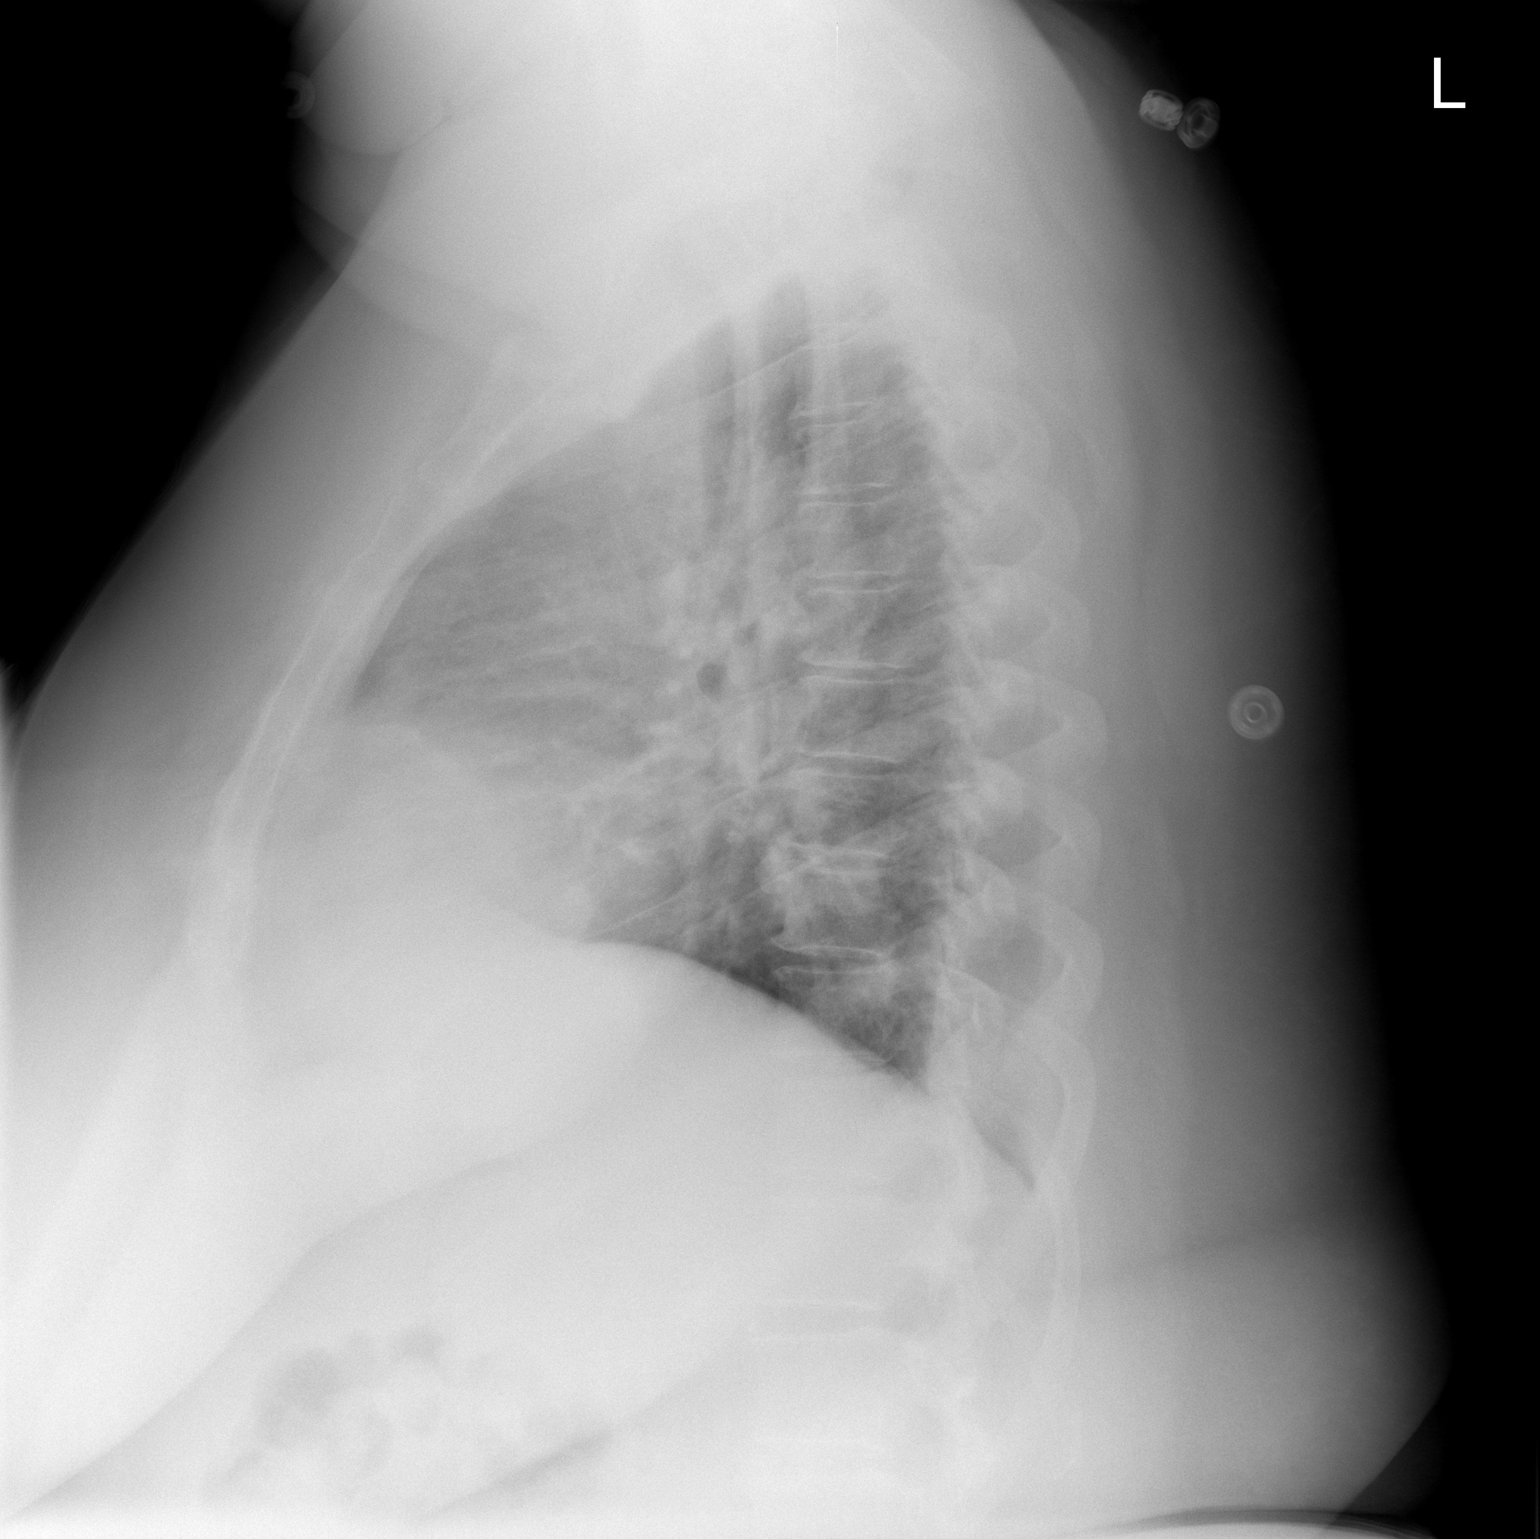

[2 of 2 positions shown; findings below may reference images not displayed]

FINDINGS: Stable enlarged cardiac silhouette.  Mildly prominent
pulmonary vasculature with an interval increase in prominence.  No
pleural fluid or pulmonary edema seen.  Mild thoracic spine
degenerative changes.
IMPRESSION: Stable cardiomegaly with interval pulmonary vascular congestion.

## 2010-08-21 ENCOUNTER — Inpatient Hospital Stay (HOSPITAL_COMMUNITY)
Admission: AD | Admit: 2010-08-21 | Discharge: 2010-08-22 | Disposition: A | Discharge status: Home / Self Care | Payer: Medicaid Other | Source: Ambulatory Visit | Attending: Obstetrics & Gynecology | Admitting: Obstetrics & Gynecology

## 2010-08-21 DIAGNOSIS — N938 Other specified abnormal uterine and vaginal bleeding: Secondary | ICD-10-CM | POA: Insufficient documentation

## 2010-08-21 DIAGNOSIS — N949 Unspecified condition associated with female genital organs and menstrual cycle: Secondary | ICD-10-CM

## 2010-08-21 LAB — CBC
HCT: 41.4 % (ref 36.0–46.0)
MCHC: 31.2 g/dL (ref 30.0–36.0)
MCV: 93.9 fL (ref 78.0–100.0)
Platelets: 264 10*3/uL (ref 150–400)
RDW: 13.9 % (ref 11.5–15.5)
WBC: 11.6 10*3/uL — ABNORMAL HIGH (ref 4.0–10.5)

## 2010-08-22 ENCOUNTER — Inpatient Hospital Stay (HOSPITAL_COMMUNITY): Payer: Medicaid Other

## 2010-08-22 ENCOUNTER — Encounter: Payer: Self-pay | Admitting: *Deleted

## 2010-08-22 ENCOUNTER — Ambulatory Visit (INDEPENDENT_AMBULATORY_CARE_PROVIDER_SITE_OTHER): Payer: Medicaid Other | Admitting: Internal Medicine

## 2010-08-22 ENCOUNTER — Encounter: Payer: Self-pay | Admitting: Internal Medicine

## 2010-08-22 VITALS — BP 165/98 | HR 80 | Temp 98.4°F | Resp 20 | Ht 65.0 in | Wt >= 6400 oz

## 2010-08-22 DIAGNOSIS — L0292 Furuncle, unspecified: Secondary | ICD-10-CM

## 2010-08-22 DIAGNOSIS — Q828 Other specified congenital malformations of skin: Secondary | ICD-10-CM

## 2010-08-22 DIAGNOSIS — M549 Dorsalgia, unspecified: Secondary | ICD-10-CM

## 2010-08-22 DIAGNOSIS — L0293 Carbuncle, unspecified: Secondary | ICD-10-CM

## 2010-08-22 MED ORDER — DOXYCYCLINE HYCLATE 100 MG PO TABS
100.0000 mg | ORAL_TABLET | Freq: Once | ORAL | Status: AC
  Administered 2010-08-22: 100 mg via ORAL

## 2010-08-22 NOTE — Progress Notes (Signed)
Subjective:    Patient ID: Kara Vincent, female    DOB: 10-17-1965, 45 y.o.   MRN: [id number]  HPI  Kara Vincent is a 45 year old woman who presents to clinic today complaining of a "bump" under her armpit that started 2 days ago.  She states that she felt it start to come up and that it was painful and warm.  She states she took her temperature at home and it was 99.3 F.  No other fevers or chills.  On further questioning she also brings up a bump behind her right ear as well as an increased redness around her collar.  She states those have been there for several days.  She denies any drainage from anything and has not tried any hot compresses or anything other then taking her percocet for the pain.    She also is asking today for a refill of her pain medications.  She states that she last took her medication on 08/21/10 and is now out of her medications.  Her last refill prescription was dated 07/31/10 so she is approximately 10 days early for her refill.  She states that she has been taking the medication every 4-6 hours for her pain.  She is on a narcotic contract and here is concern about misuse that was documented on the UDS that was done on 07/31/10.  In short her UDS was completely negative for all opiates as well as the xanax that she is supposed to receive from Dr. [name] her OB/GYN per our contract.   Review of Systems    Constitutional: Denies fever, chills, diaphoresis, appetite change and fatigue.  HEENT: Denies photophobia, eye pain, redness, hearing loss, ear pain, congestion, sore throat, rhinorrhea, sneezing, mouth sores, trouble swallowing, neck pain, neck stiffness and tinnitus.   Respiratory: Denies SOB, DOE, cough, chest tightness,  and wheezing.   Cardiovascular: Denies chest pain, palpitations and leg swelling.  Gastrointestinal: Denies nausea, vomiting, abdominal pain, diarrhea, constipation, blood in stool and abdominal distention.  Genitourinary: Denies dysuria, urgency,  frequency, hematuria, flank pain and difficulty urinating.  Musculoskeletal: Positive for back pain Denies myalgias, joint swelling, arthralgias and gait problem.  Skin: Positive for rash Denies pallor, and wound.  Neurological: Denies dizziness, seizures, syncope, weakness, light-headedness, numbness and headaches.  Hematological: Denies adenopathy. Easy bruising, personal or family bleeding history  Psychiatric/Behavioral: Denies suicidal ideation, mood changes, confusion, nervousness, sleep disturbance and agitation  Objective:   Physical Exam    Constitutional: Vital signs reviewed.  Patient is an obese, poorly kept woman in no acute distress and cooperative with exam. Alert and oriented x3.  Head: Normocephalic and atraumatic Ear: TM normal bilaterally Mouth: no erythema or exudates, MMM Eyes: PERRL, EOMI, conjunctivae normal, No scleral icterus.  Neck: Supple, Trachea midline normal ROM, No JVD, mass, thyromegaly, or carotid bruit present.  Cardiovascular: RRR, S1 normal, S2 normal, no MRG, pulses symmetric and intact bilaterally Pulmonary/Chest: CTAB, no wheezes, rales, or rhonchi Abdominal: Soft. Obese, Non-tender, non-distended, bowel sounds are normal, no masses, organomegaly, or guarding present.  Musculoskeletal: No joint deformities, erythema, or stiffness Hematology: no cervical, inginal, or axillary adenopathy.  Neurological: A&O x3, Strength is diminished bilaterally, cranial nerve II-XII are grossly intact, no focal motor deficit, sensory intact to light touch bilaterally.  Skin: Dry with scattered crusting lesions over all 4 extremities, face, chest, neck, and back.  There is a 3.5 x 1.5 cm indurated, red nodule in the right axilla that is warm to the touch.  There is a small area in the center that is the beginning of a head but no drainage can be expressed.  There is a 1.5 x 1.5 cm nodule behind her right ear that is warm, red, and indurated.  There is no drainage from the  area but it is tender to the touch, there is small area of healed ulcer in the center, no drainage can be expressed.  There is a diffuse erythematous rash with scattered crusting across her anterior chest and neck that ends abruptly below the collar of her shirt.  NO drainage or area of induration.   Psychiatric: Normal mood and affect. speech and behavior is normal. Judgment and thought content normal. Cognition and memory are normal.   Assessment & Plan:

## 2010-08-22 NOTE — Progress Notes (Signed)
Pt presents as walkin c/o "boil" in R armpit, allergies causing eye to be "watery" and itchy. She states she called and ask for a same day appt this am and she decided she would come over to clinic and be seen. She is reminded she needs to call and schedule appts not as same day but as a scheduled appt. She shrugs and says "ok". appt is scheduled at 1015 this am w/ pcp

## 2010-08-22 NOTE — Progress Notes (Signed)
Pt. Given Doxycyline 100 mg PO per W.O. Dr. Obie Dredge, at 75: 55 P. Pt finished her lunch completely and asked to lie down. Pt given warm washcloth to "boils", one behind right ear and one slightly above and to front of right axilla. V/S taken at 1: 10 P, 15 min. After Doxycyline taken - T- 98.2 , 118/71, P - 83, R- 20. Kara Alanis, RN, 08/22/2010 1: 29P  Pt remained in lying position with warm compresses on areas noted until 3 P, at which time she sat up to leave. Dr. Obie Dredge in room giving pt discharge instructions when last set of v/s taken and given to Dr. Obie Dredge. No c/o discomfort - discharge v/s 98.4, b/p 165/98, p - 80 - R - 20. Pt verbalized understanding of need to return to clinic next Tuesday for further f/u, to pick up her Rx from the [facility] ED physician, and when to use her EPI pen per Dr. [name] instructions. [name], 08/22/2010, 3: 44 P

## 2010-08-22 NOTE — Assessment & Plan Note (Signed)
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  She states she has been out in the sun and is carrying what appears to be a hotel towel with her and throughout the interview is using it to wipe her upper chest where the rash is.  I don't see any area of her chest that looks to be infected currently so encouraged her to continue to use her skin creams and to try to keep the area as dry as possible.

## 2010-08-22 NOTE — Assessment & Plan Note (Signed)
CBC:    Component Value Date/Time   WBC 11.6* 08/21/2010 2340   HGB 12.9 08/21/2010 2340   HCT 41.4 08/21/2010 2340   PLT 264 08/21/2010 2340   MCV 93.9 08/21/2010 2340   She was seen in the ER last night for vaginal bleeding and had labs drawn.  She has a mild leukocytosis but not too elevated above her baseline.  Both her armpit and behind her ear appear to be early abscesses.  She has multiple problems with antibiotics with listed problems with rash to Doxycycline, Clindamycin, Bactrim, and Azithromycin.  Her last abscess that was drained in March of 2012 grew MRSA that was also resistant to Clindamycin.  Also one of the two cultures had a resistance to Tetracycline as well.  At that time she was placed on Zyvox.  Looking back through her records there is no documented true reaction and with her baseline rash it is difficult to know what is due to the drug and what is due to her underlying disease.  She also has clear preferences.  Dr. Jobe Igo and I had a long discussion with her today about concerns about antibiotics since she has been on so many recently.  The concern is with what appears to be resistance blooming to several medications including the most commonly used antibiotics that if we don't sort out what is a true allergy we may eventually run out of options.  She is willing to retry some of the medications so today in clinic we will give her a test dose of Doxycycline 100 mg tablets and have her closely monitored to ensure she does not have an anaphylactic reaction.  We will then her follow up on Tuesday 08/27/10 and if she has no reaction we will give her a course of Doxycycline to try to treat these abscesses.  In the mean time we will have her use warm compresses on both of them to try to get them to open and drain on their own.  If they progress or do not drain we will have her back to try to I&D them in the clinic.

## 2010-08-22 NOTE — Assessment & Plan Note (Addendum)
She is 10 days early for her pain medication refill.  She admits to not taking the medication as prescribed and is not doing what she needs to help the pain such as weight loss and continued activity.  We will check a UDS with opiate confirmation today and follow up after that.  If she is violating the contract we will be forced to deny her continued pain medications.  She could maybe get set with a pain clinic if that is the case but we will see what the UDS shows.    Her UDS was negative again.  The total opiate confirmatory is again negative with absolutely NO metabolites in her urine.  I spoke with the representative from [facility] labs who stated that she had likely not taken the medication in approximately 4 days.  Which means she is running out of her medications at least 14 days prior to her refill.  There is concern for diversion but at the very least, severe misuse of her medication.  This will need to be discussed with her at her next visit and when she is due for her next refill.

## 2010-08-22 NOTE — Patient Instructions (Addendum)
1. You need to keep your skin as dry as possible.  Please try to avoid excessive sun exposure.  Also be sure to use your skin creams as directed.  2. Please use Warm compresses on the spots under your arm and behind your ear.  20 minutes on several times daily.  This will help them mature so they can drain on their own.  3. Follow up on Tuesday June 5th to see Dr. Shon Baton for follow up.    4. Continue taking all your other medications as prescribed.

## 2010-08-23 LAB — DRUGS OF ABUSE SCREEN W/O ALC, ROUTINE URINE
Amphetamine Screen, Ur: NEGATIVE
Cocaine Metabolites: NEGATIVE
Creatinine,U: 377.2 mg/dL
Opiate Screen, Urine: NEGATIVE
Phencyclidine (PCP): NEGATIVE

## 2010-08-27 LAB — OPIATE, QUANTITATIVE, URINE
Codeine Urine: NEGATIVE NG/ML
Oxycodone - Total: NEGATIVE NG/ML

## 2010-08-29 ENCOUNTER — Encounter: Payer: Self-pay | Admitting: Internal Medicine

## 2010-08-29 ENCOUNTER — Ambulatory Visit: Payer: Medicaid Other | Admitting: Internal Medicine

## 2010-08-29 ENCOUNTER — Ambulatory Visit (INDEPENDENT_AMBULATORY_CARE_PROVIDER_SITE_OTHER): Payer: Medicaid Other | Admitting: Internal Medicine

## 2010-08-29 VITALS — BP 129/95 | HR 77 | Temp 98.2°F | Ht 65.0 in | Wt >= 6400 oz

## 2010-08-29 DIAGNOSIS — M1710 Unilateral primary osteoarthritis, unspecified knee: Secondary | ICD-10-CM

## 2010-08-29 DIAGNOSIS — Q828 Other specified congenital malformations of skin: Secondary | ICD-10-CM

## 2010-08-29 DIAGNOSIS — M549 Dorsalgia, unspecified: Secondary | ICD-10-CM

## 2010-08-29 DIAGNOSIS — L0293 Carbuncle, unspecified: Secondary | ICD-10-CM

## 2010-08-29 DIAGNOSIS — L0292 Furuncle, unspecified: Secondary | ICD-10-CM

## 2010-08-29 MED ORDER — DOXYCYCLINE HYCLATE 100 MG PO TABS: 100.0000 mg | ORAL_TABLET | Freq: Two times a day (BID) | ORAL | Status: DC

## 2010-08-29 MED ORDER — OXYCODONE-ACETAMINOPHEN 10-325 MG PO TABS: 1.0000 | ORAL_TABLET | Freq: Three times a day (TID) | ORAL | Status: AC | PRN

## 2010-08-29 NOTE — Patient Instructions (Addendum)
Please take your antibiotics twice daily for 10 days. Follow-up with [place] in 7 days and also with Dr. Obie Dredge - the front desk will make these appointments. If you develop fever, chills, or start to feel ill, please call the clinic or go to the emergency room. Today you had an incision into an abscess - please keep the area clean and dressed - you may want to call your nurse to get her to teach you how.

## 2010-08-29 NOTE — Assessment & Plan Note (Signed)
Her rash appears improved today.

## 2010-08-29 NOTE — Assessment & Plan Note (Signed)
I refilled the patient's pain medicine today. She can pick up the prescription from the pharmacy tomorrow. She requested an increased number of pills and I explained to her that that is a decision that can only be made after discussion with her primary care physician. She will be scheduled today for the next available appointment with him.

## 2010-08-29 NOTE — Progress Notes (Signed)
  Subjective:    Patient ID: Kara Vincent, female    DOB: June 15, 1965, 45 y.o.   MRN: [id number]  HPI Comments: Patient states she tolerated the doxycycline - went home had some SOB, took a nebulizer treatment, and was fine.  Complains of headache, dizzy, numbness in feet and legs when sitting down and sometimes when lying flat.  Some increased swelling in her legs.  No incontinence of stool.    No fevers, chills, sweats.  States she has had worsening shortness of breath "like I am swimming in water in my lungs."  Took a breathing treatment and that helps.  She has taken her breathing treatments every 2-4 hours.        Review of Systems As per HPI    Objective:   Physical Exam  Constitutional: She is oriented to person, place, and time.       Obese woman appearing older than stated age.  HENT:  Mouth/Throat: Oropharynx is clear and moist. No oropharyngeal exudate.       Bilateral tympanic membranes appear partially obstructed with wax and debris. No erythema in the external ear canal.  Inferior nasal eyelid appears to have a small 0.25 cm area of erythema. This is non-draining.  Eyes: Conjunctivae and EOM are normal. Pupils are equal, round, and reactive to light.  Neck: Normal range of motion. Neck supple.  Cardiovascular: Normal rate, regular rhythm and normal heart sounds.   No murmur heard. Pulmonary/Chest: Effort normal and breath sounds normal. No respiratory distress. She has no wheezes. She has no rales.  Abdominal: Soft. Bowel sounds are normal.  Musculoskeletal: She exhibits tenderness. She exhibits no edema.  Neurological: She is alert and oriented to person, place, and time. No cranial nerve deficit.  Skin: Skin is warm.       Patient has improved rash on around her neck and anterior chest. She has resolving 0.5 cm nodule on her right posterior ear. She has a 2-3 cm nonfluctuant nondraining firm subcutaneous nodule in her right armpit. She has a 3-4 cm fluctuant  subcutaneous nodule in her left armpit. These lesions are tender to the touch.          Assessment & Plan:

## 2010-08-29 NOTE — Assessment & Plan Note (Addendum)
Patient tolerated doxycycline after the last clinic visit without any evidence of anaphylaxis. The patient did not have any new lesions. The lesion in the patient's left armpit appeared amenable to incision and drainage.  Under the supervision of Dr. Nance Pew, consent was obtained from the patient, a timeout was performed, and using 1% lidocaine (8cc) to numb the cleaned area the patient had an approximately 1.5 cm incision into the abscess.  Approximately 5 cc of bloody pus was extracted. The area was dressed with a clean dry dressing. The patient has home health wound care consult as she will need daily dressing changes. She will take doxycycline for 10 days. I will have her followup back at the clinic in one week. She will then have to follow up with her primary care physician Dr. Obie Dredge at his next available appointment.

## 2010-09-03 ENCOUNTER — Telehealth: Payer: Self-pay | Admitting: *Deleted

## 2010-09-03 NOTE — Telephone Encounter (Signed)
Pt seen last week in office and had abscess drained under arm.  Pt given Rx for  doxycycline to take at home. Call from [facility] with [facility] stating pt is allergic to doxy and having a reaction.  She reports a rash on chest to neck and face.   Facial swelling. Pt states she is  not able to swallow well.  Last does was yesterday and pt was told to stop med by [facility].  Pt is not SOB.  Pt told to call 911 if increase of symptoms or SOB. Pt is walking without problem.  O2 sats 95%  Pt has appointment tomorrow. Reports 2 more boils under arm.  I had Dr Shon Baton talk with nurse and she was told to call 911 if pt in any distress.  Okay to stop the Doxy Will see tomorrow.

## 2010-09-04 ENCOUNTER — Ambulatory Visit (INDEPENDENT_AMBULATORY_CARE_PROVIDER_SITE_OTHER): Payer: Medicaid Other | Admitting: Internal Medicine

## 2010-09-04 ENCOUNTER — Encounter: Payer: Self-pay | Admitting: Internal Medicine

## 2010-09-04 ENCOUNTER — Ambulatory Visit: Payer: Medicaid Other | Admitting: Internal Medicine

## 2010-09-04 DIAGNOSIS — L0293 Carbuncle, unspecified: Secondary | ICD-10-CM

## 2010-09-04 DIAGNOSIS — I10 Essential (primary) hypertension: Secondary | ICD-10-CM

## 2010-09-04 IMAGING — CR DG CHEST 2V
2 series · 2 of 2 positions shown · non-contrast
Comparison: 01/29/2008.

CLINICAL DATA: Wheezing and shortness of breath.

CHEST - 2 VIEW

[w chest pa]
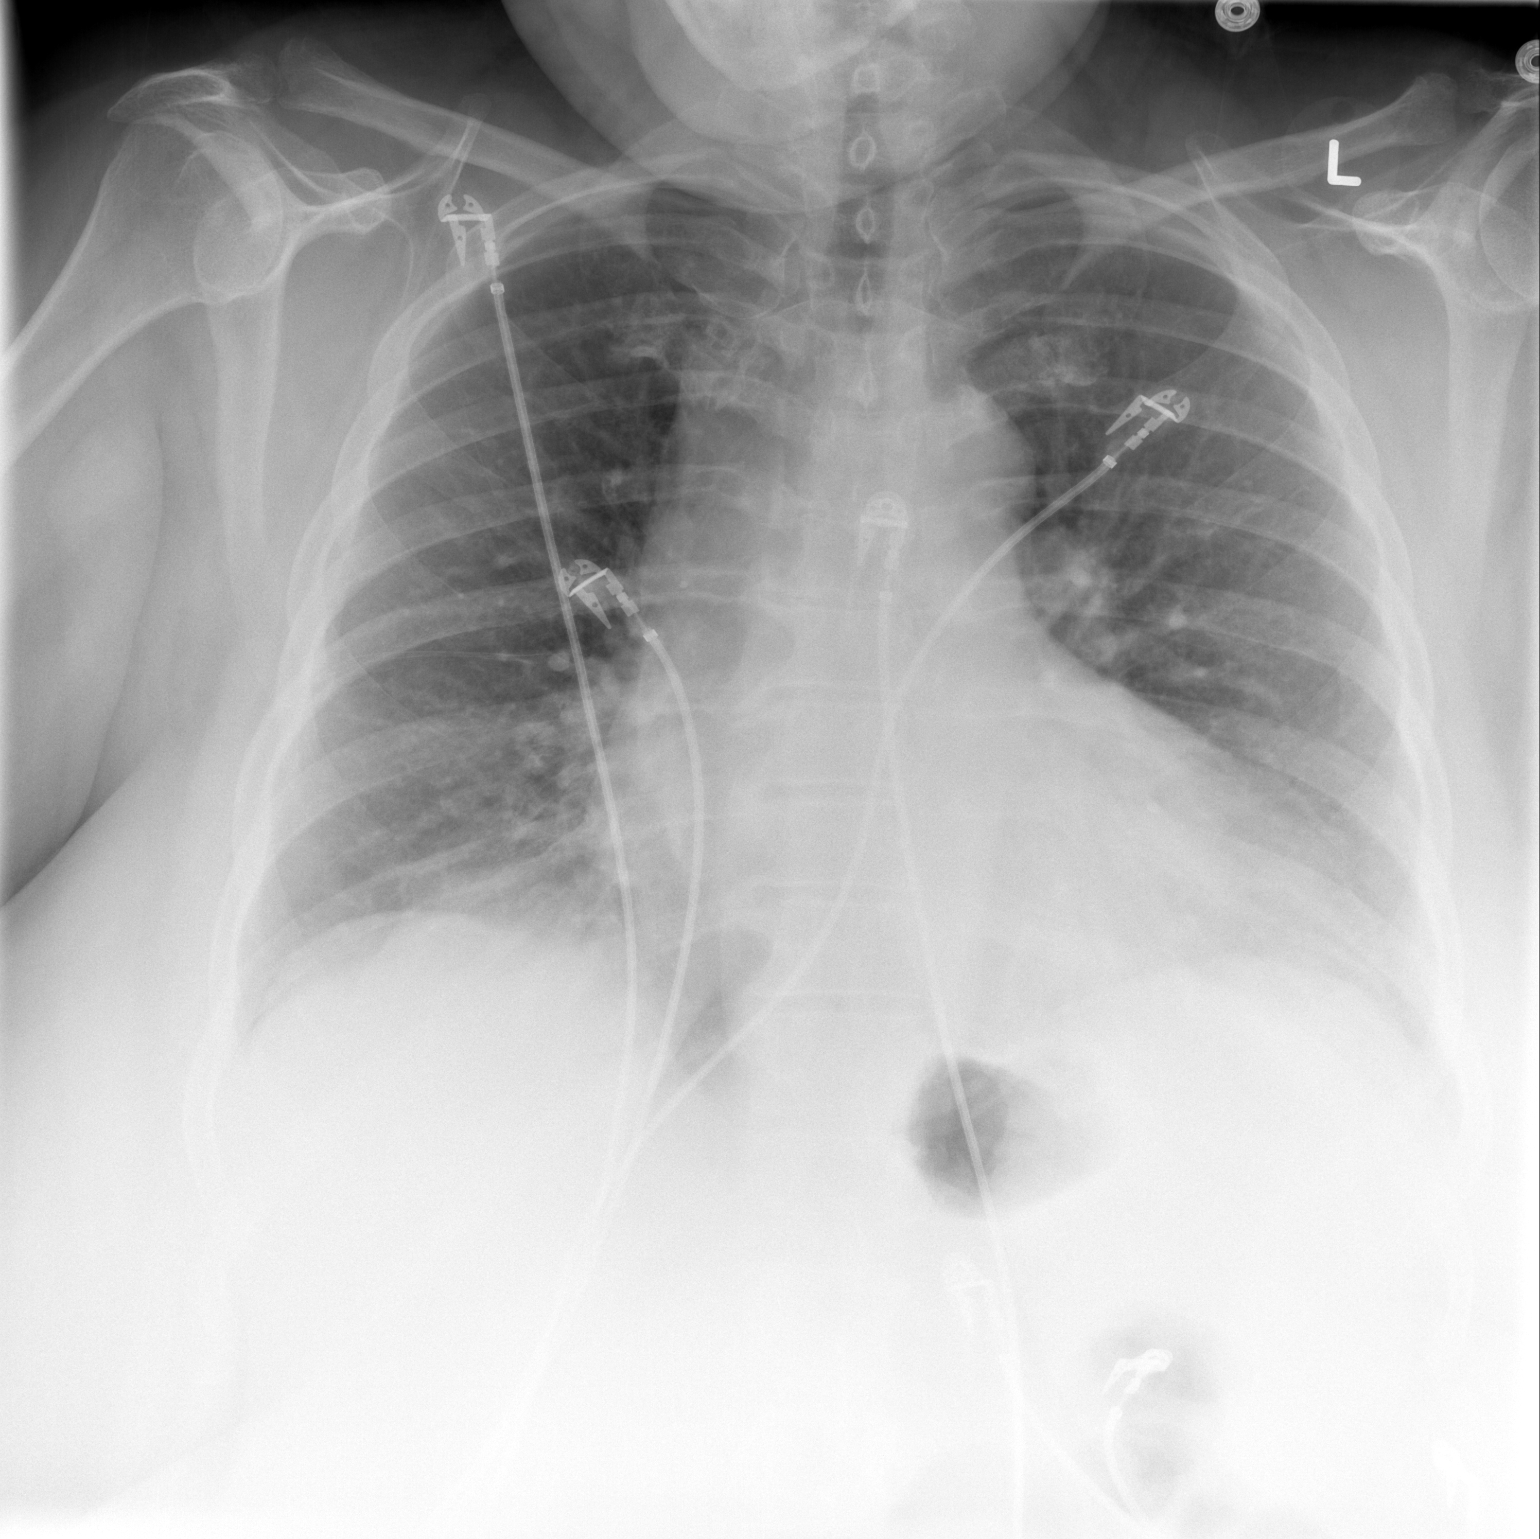

[w chest lat]
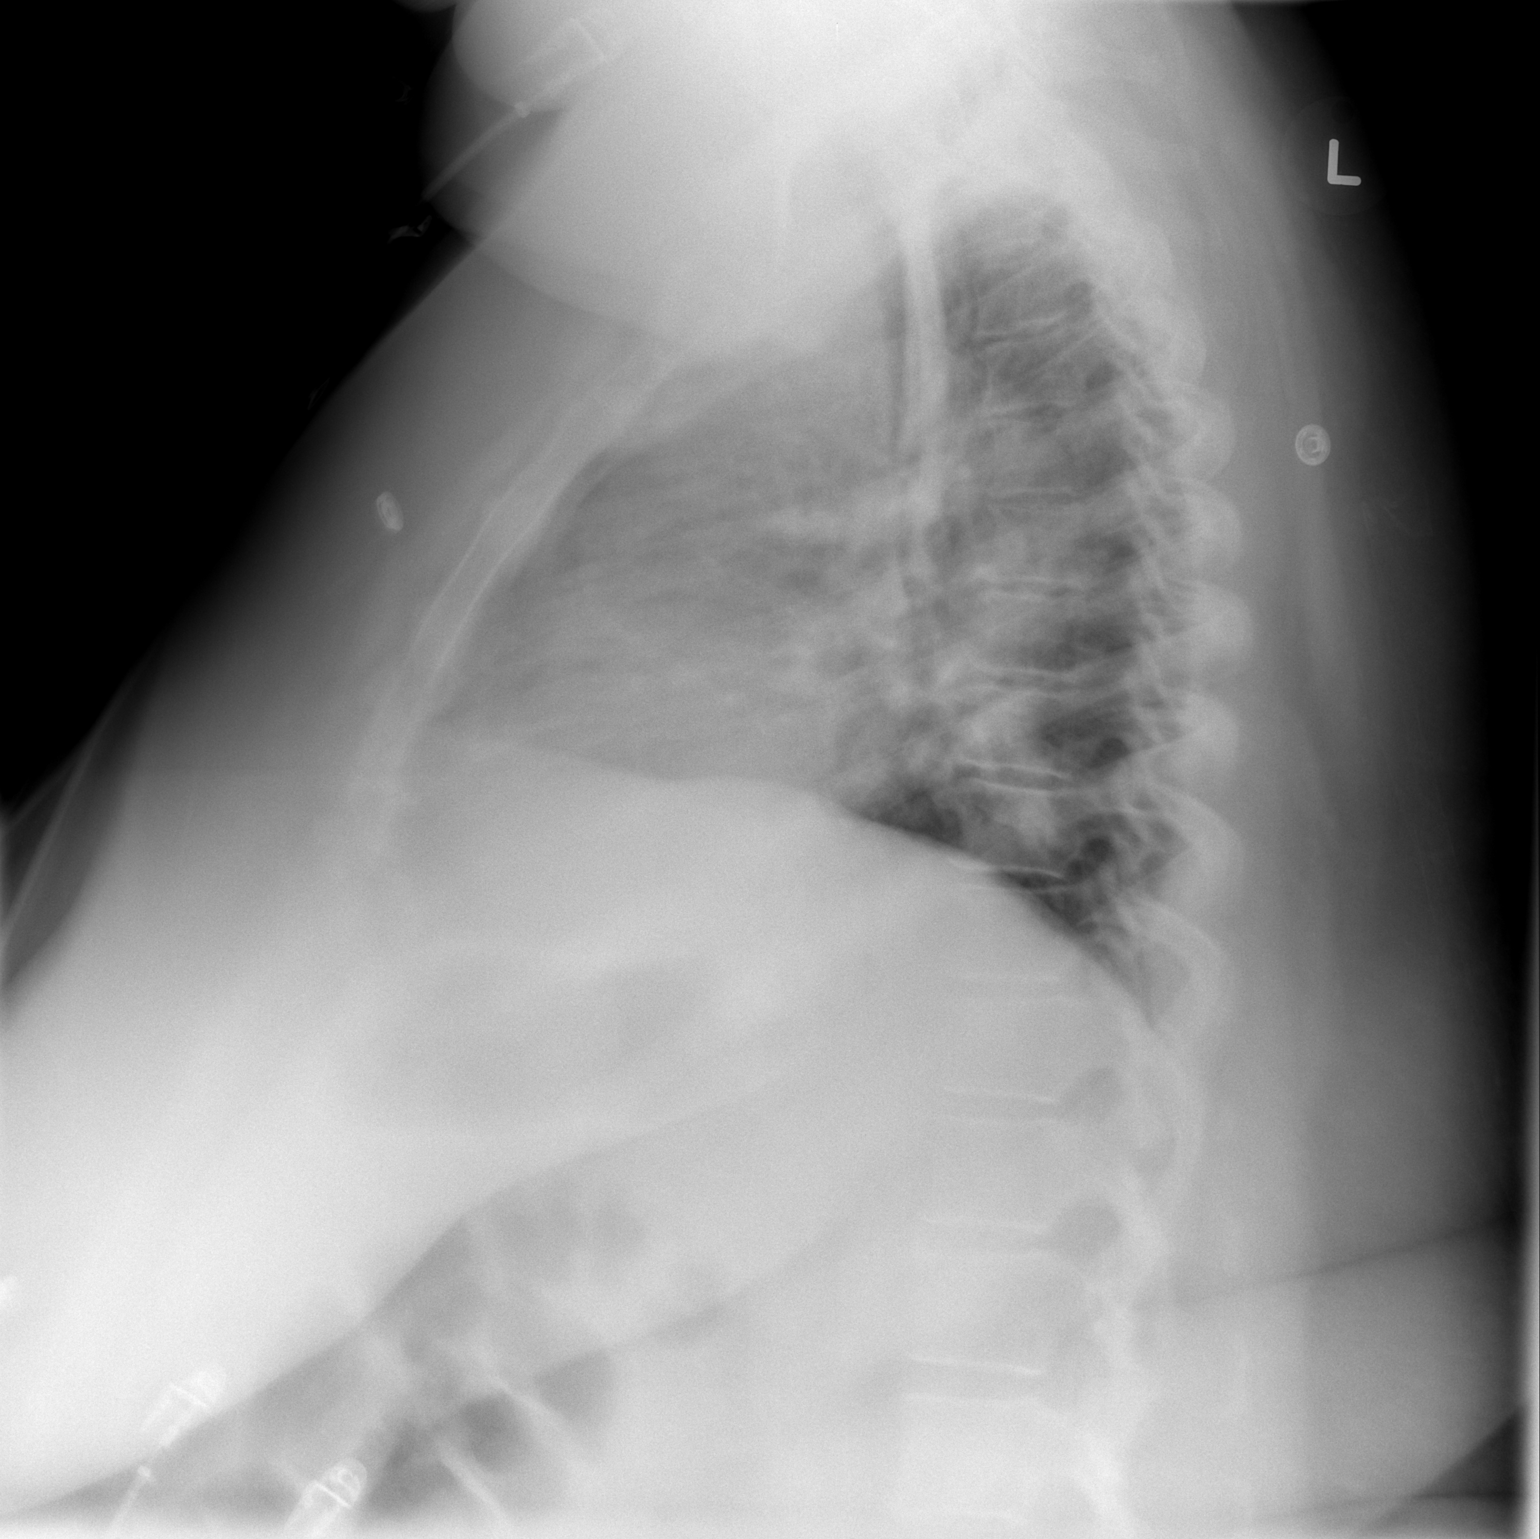

[2 of 2 positions shown; findings below may reference images not displayed]

FINDINGS: Stable enlarged cardiac silhouette.  Prominent pulmonary
vasculature with mild improvement.  No pleural fluid.  Breathing
motion blurring on the lateral view.  Thoracic spine degenerative
changes.
IMPRESSION: 1.  Mild pulmonary vascular congestion with improvement.
2.  Stable cardiomegaly.

## 2010-09-04 NOTE — Patient Instructions (Addendum)
Please continue to take your medications as directed. Today we evaluated the sites of infection under your arms - they look to be healing well and no signs of active infection. You have been informed today that you will need to find another medical provider.  We will mail you a list of some options in the area. We will continue to provide you emergency care for the next 30 days and also refills of your medication.  After that time you will need to find another medical provider.

## 2010-09-04 NOTE — Progress Notes (Signed)
Subjective:    Patient ID: Kara Vincent, female    DOB: [birth year], 45 y.o.   MRN: [id number]  HPI Comments: Kara Vincent is a 45 year old woman who presents for followup after incision and drainage of left axial abscess on 08/29/2010. The patient has a history of recurrent MRSA abscesses.  After incision and drainage and removal of approximately 1/2 tablespoons of pus the wound was dressed with a clean dressing and the patient was sent home to complete a course of doxycycline. She had been given a trial dose of doxycycline the week previous as she reported to [place] to doxycycline allergy.  After that trial the patient had no reaction it was consistent with anaphylaxis or other life-threatening condition. After the patient's  incision and drainage on 08/29/2010 it was thought prudent to treat her with a course of doxycycline. The patient took the doxycycline and had no reaction until yesterday 09/03/2010 when she told her home health nurse and she had worsening rash. She had no trouble breathing at that time. The home health nurse states she observe the rash and I had a discussion with her over the telephone. The nurse did not feel it is necessary for the patient to be immediately evaluated nor sent to the emergency room. She already had a appointment to see [place] today 09/04/2010 and I advised the patient to keep this appointment. The patient stopped taking the doxycycline and presented to clinic today for followup.  The patient reports today that the rash is still present mostly on her upper chest and abdomen. She denies any fevers or chills or sweats. No other systemic complaints.  She [place] [facility] [place] [facility] [place].       Review of Systems  Constitutional: Negative for fever, chills and activity change.  HENT: Negative for facial swelling.   Respiratory: Negative for chest tightness, shortness of breath and stridor.   Cardiovascular: Negative for chest  pain.  Neurological: Negative for dizziness, syncope and light-headedness.  Psychiatric/Behavioral: Negative for behavioral problems and confusion.       Objective:   Physical Exam  Vitals reviewed. Constitutional: She is oriented to person, place, and time. No distress.       Extremely obese woman appearing older than stated age in a wheelchair  HENT:  Head: Normocephalic and atraumatic.  Neck: Normal range of motion.  Cardiovascular: Normal rate, regular rhythm and normal heart sounds.   Pulmonary/Chest: Effort normal and breath sounds normal. No respiratory distress. She has no wheezes. She has no rales.  Abdominal: Soft. Bowel sounds are normal. There is no tenderness.  Musculoskeletal: She exhibits no edema.  Neurological: She is alert and oriented to person, place, and time. No cranial nerve deficit.  Skin: Skin is warm and dry.       Patient had improved looking rash on her upper chest and neck as compared to 08/29/2010. The site of the incision and drainage under her left axilla appeared to be without drainage and without erythema or any signs of infection. The area was nontender. He did have approximately 1.5cm area of induration at the site of the previous abscess. There is no fluctuance. The patient's right axilla where she had had 2 potential sites of infection these appear to have completely resolved. She does have some areas of old scarring and previous abscess formation but there appears to be no active infection.          Assessment & Plan:

## 2010-09-04 NOTE — Progress Notes (Signed)
We have had multidisciplinary conferences on Kara Vincent to discuss the best way to provide Kara Vincent the care that she needs. We have informed her of the concerns we have and offered her ample time to comply with our clinic guidelines. This has been documented extensively in the chart. We are no longer able to effectively provide the care that Kara Vincent needs. I informed Kara Vincent of such today with Dr Shon Baton present. I gave Kara Vincent ample time to ask questions and provided answers. The Kara Vincent expressed that she has not been satisfied with the care she has received. I have explained that we will provide emergency care for 30 days but that she needs to find a new PCP. I have typed up a letter which will be sent certified tomorrow along with a list of possible PCP's that [name] is arranging.

## 2010-09-04 NOTE — Assessment & Plan Note (Addendum)
Patient is at high risk for recurrent boils and unfortunately she has a history of growing MRSA from these and also has multiple drug intolerances.  She is likely continuing to get these as she has trouble with personal hygiene because of her weight. I spent time counseling her on weight loss and encouraged her to followup at the [facility] membership.  I explained to the patient extensively that she did not have any signs of active infection and that her site of incision and drainage looks excellent today. I fully expected the area of induration that I found on exam as part of normal healing. The patient repeatedly insisted that she needed admission, however there is no indication for admission today and the patient does not need active treatment. Her other chronic medical conditions are stable at this time.  Unfortunately for the patient there is a pattern of behavioral issues and noncompliance along with her feelings of generalized dissatisfaction with our care. There has been formal care discussions regarding the patient's and whether or not our relationship as her providers is appropriate. Today Dr. Lynnae January, the medical director of the outpatient medicine clinic, informed Kara Vincent that  the relationship between the her and the providers here in this clinic has demonstrated itself to not be a productive,  one with which she is unhappy, and one that does not appear to be meeting her needs.  We will provide emergency care and medication refills for the next 30 days but that she will need to find a new provider after that. She will be sent a formal dismissal letter by our clinic medical director Illene Regulus tomorrow along with a list of clinics that will accept her insurance which is Medicaid or

## 2010-09-04 NOTE — Assessment & Plan Note (Signed)
At goal today. No change to her medication is needed.

## 2010-09-05 ENCOUNTER — Telehealth: Payer: Self-pay | Admitting: *Deleted

## 2010-09-05 NOTE — Telephone Encounter (Signed)
HHN calls to say she would like to decrease visits to twice weekly for next 2 weeks and then pt will need to be discharged. She is given verbal ok. She is advised that pt will for next 30 days receive emergency care from [facility] after she asks if she can do anything to improve pt's status, i told her no and that i did not feel comfortable saying anything more but that pt would need to pursue another physician.

## 2010-09-05 NOTE — Telephone Encounter (Signed)
Verbal order is fine.   Thanks.

## 2010-09-06 ENCOUNTER — Encounter: Payer: Self-pay | Admitting: Licensed Clinical Social Worker

## 2010-09-11 ENCOUNTER — Telehealth: Payer: Self-pay | Admitting: *Deleted

## 2010-09-11 NOTE — Telephone Encounter (Signed)
Patient call transferred from medical records.  Patient disagrees with dismissal.  I re-emphasized to her that her acute care needs would be taken care of for 30 days and she can always seek emergency treatment.  She is no longer a patient at [facility] and I suggest she locate a PCP.  She can sign a release to transfer her records.

## 2010-09-12 IMAGING — CR DG CHEST 2V
2 series · 2 of 2 positions shown · non-contrast
Comparison: 02/16/2008

CLINICAL DATA: Shortness of breath and asthma

CHEST - 2 VIEW

[w chest pa]
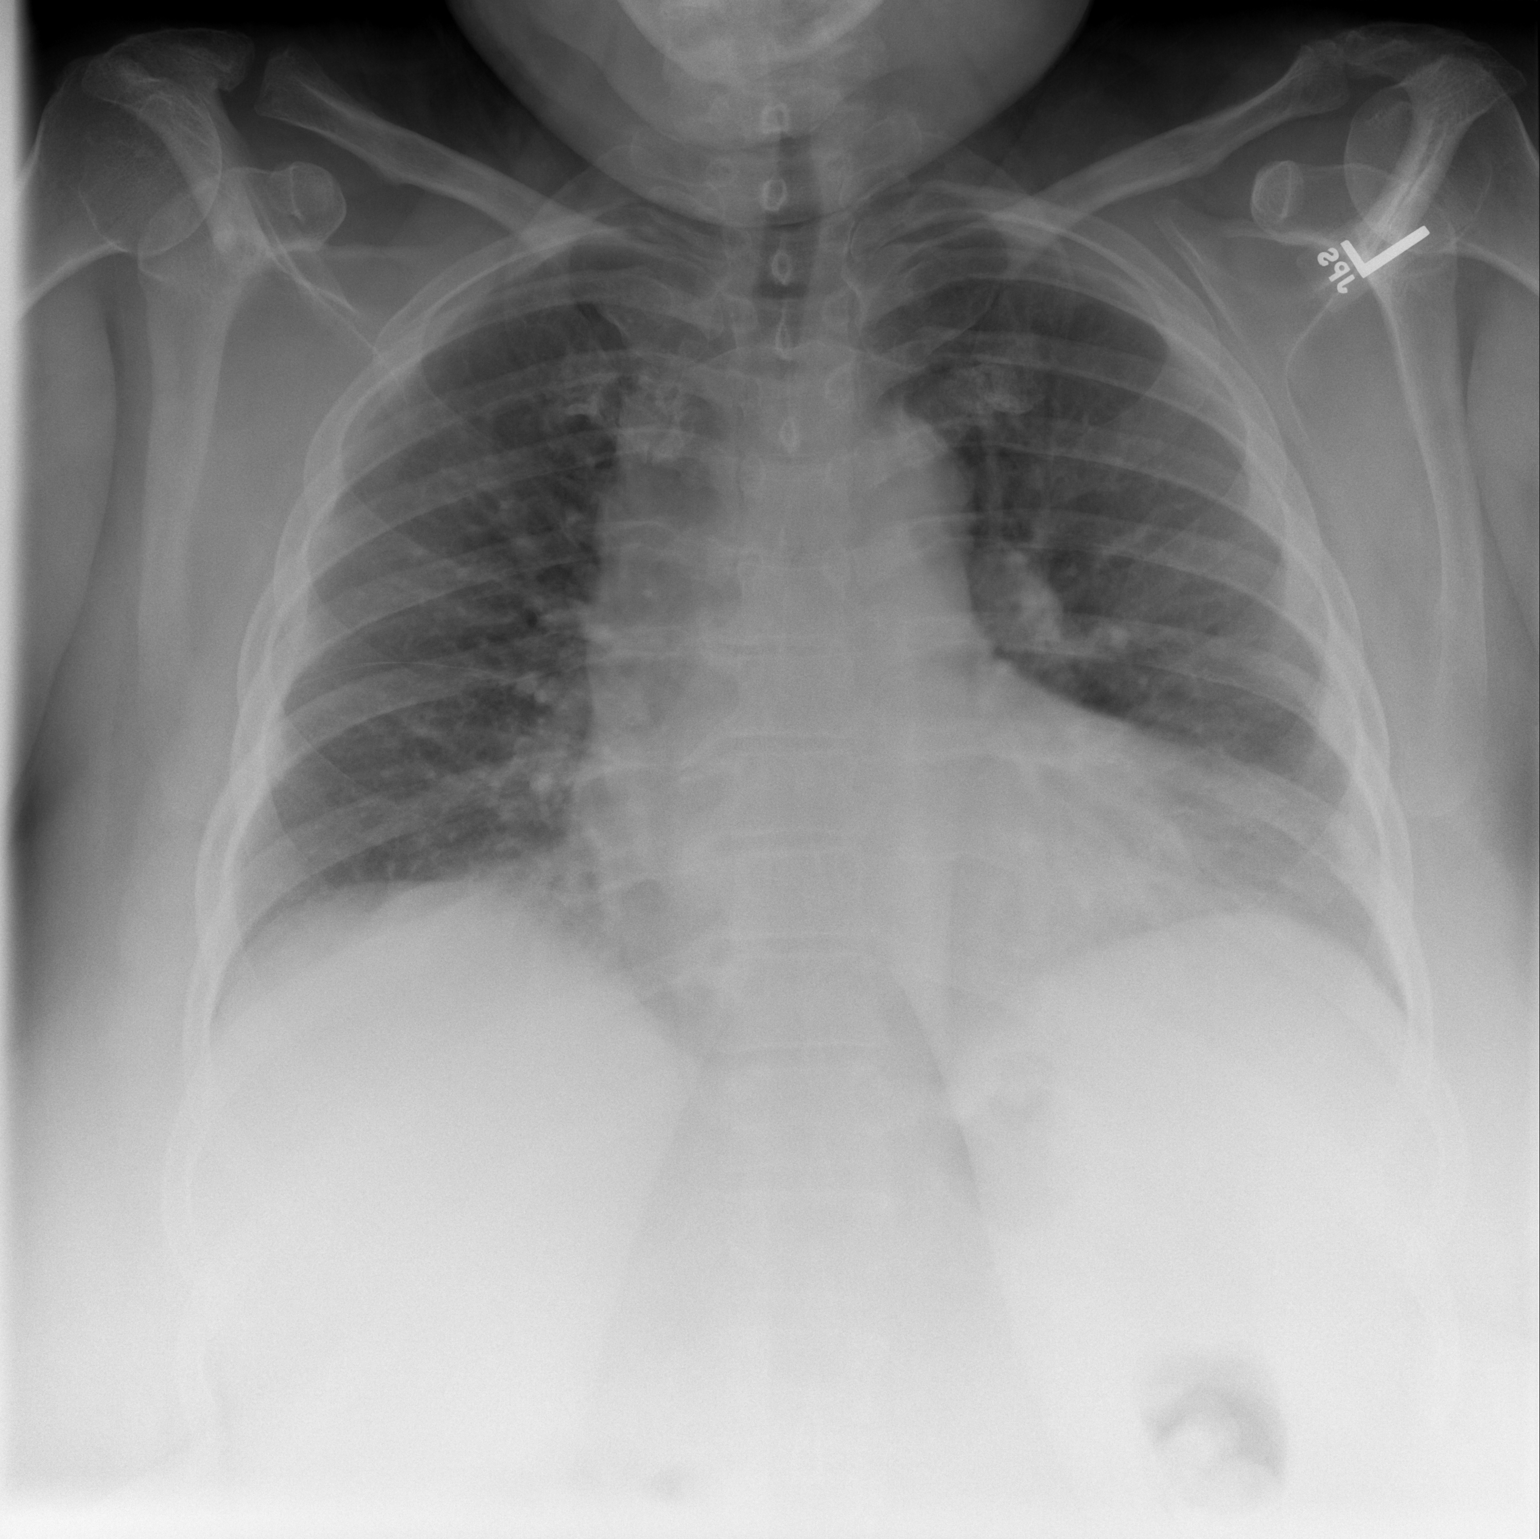

[w chest lat *]
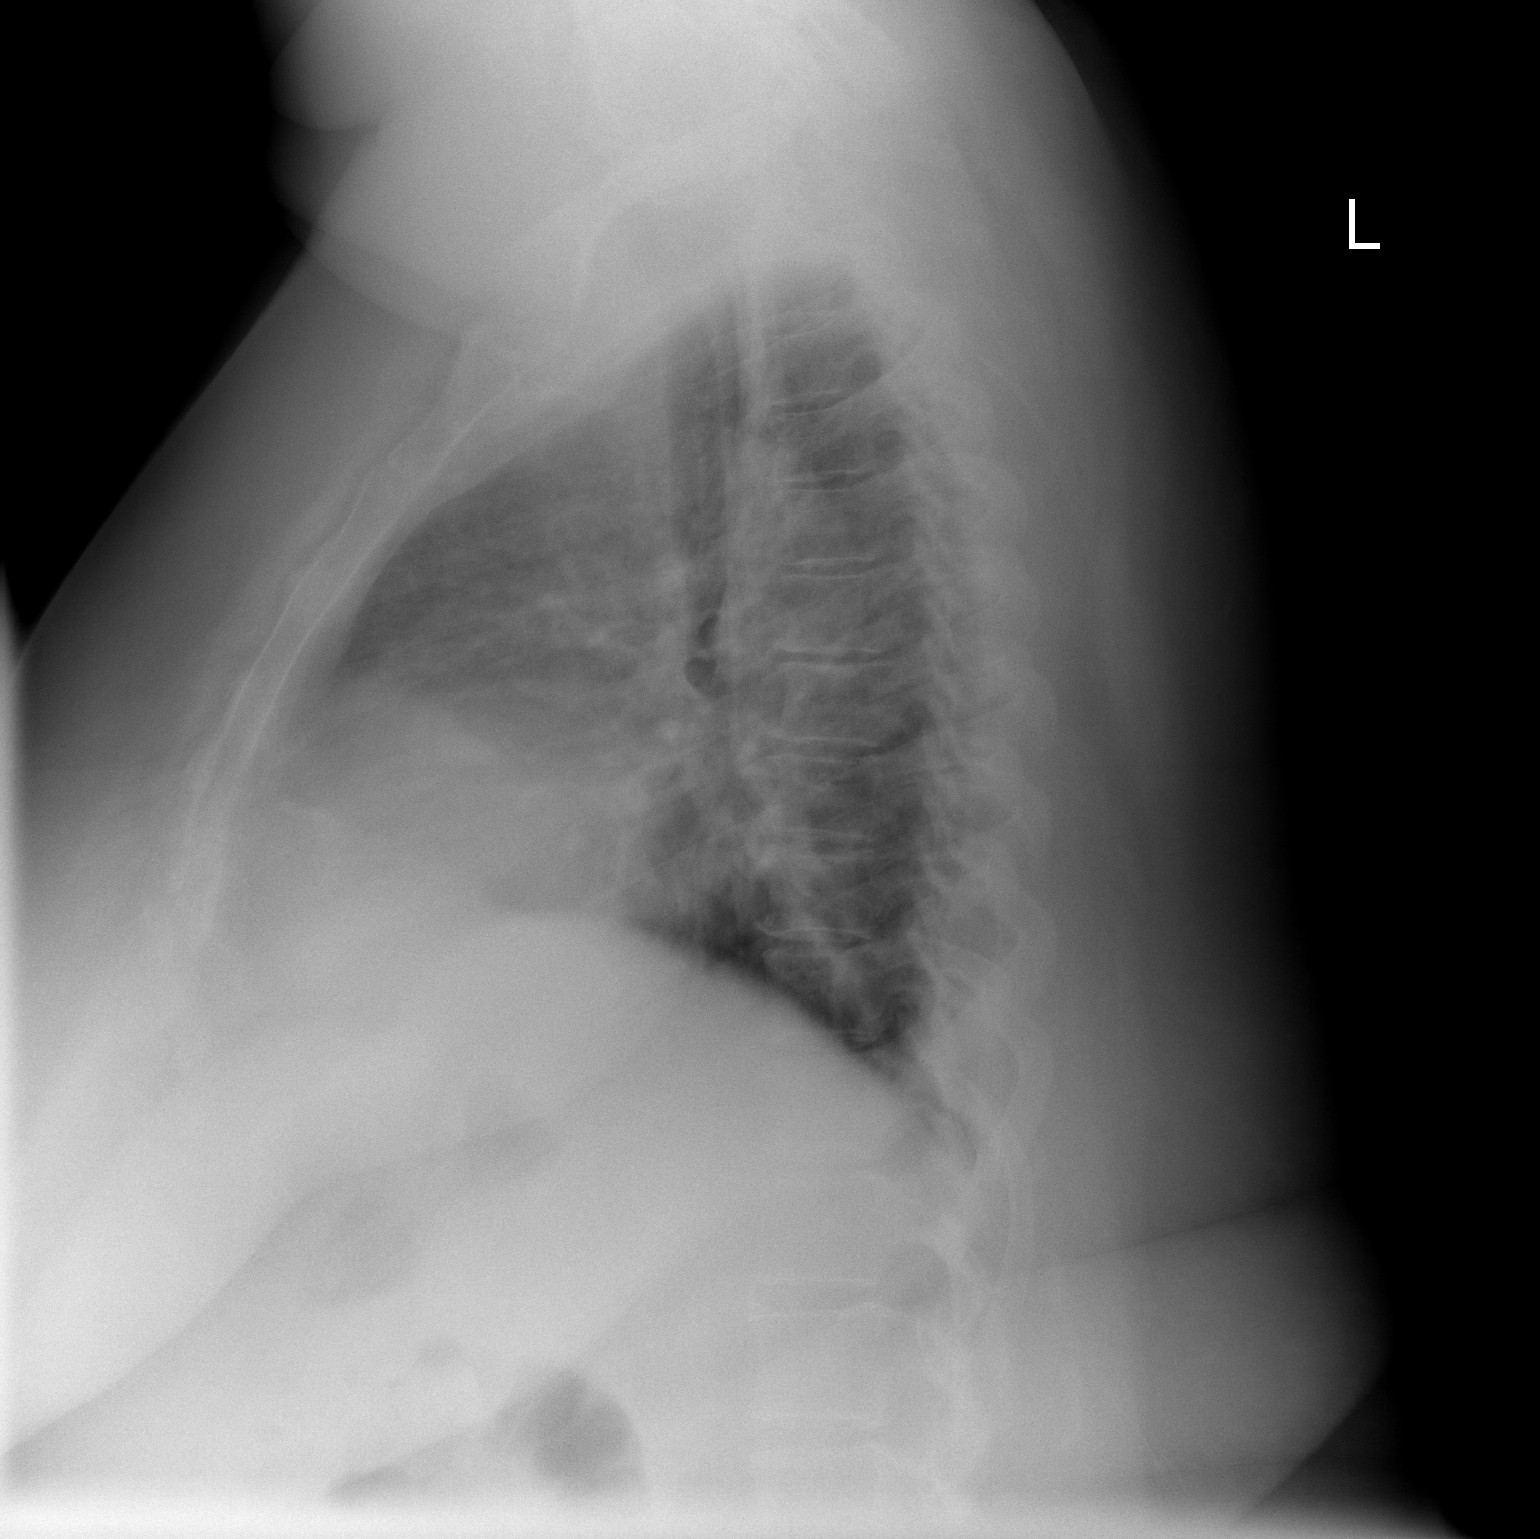

[2 of 2 positions shown; findings below may reference images not displayed]

FINDINGS: Mildly motion degraded lateral view.  Mild thoracic
spondylosis. Midline trachea. Mild cardiomegaly. No pleural
effusion or pneumothorax. Low lung volumes.  This accentuates
pulmonary vascularity.  Probable concurrent peribronchial
thickening.  Mild bibasilar atelectasis.
IMPRESSION: 1.  Cardiomegaly without acute disease.
2.  Peribronchial thickening which may relate to chronic bronchitis
or smoking.

## 2010-09-26 IMAGING — CR DG CHEST 2V
2 series · 2 of 2 positions shown · non-contrast
Comparison: 02/24/2008

CLINICAL DATA: Shortness of breath and wheezing

CHEST - 2 VIEW

[w chest pa]
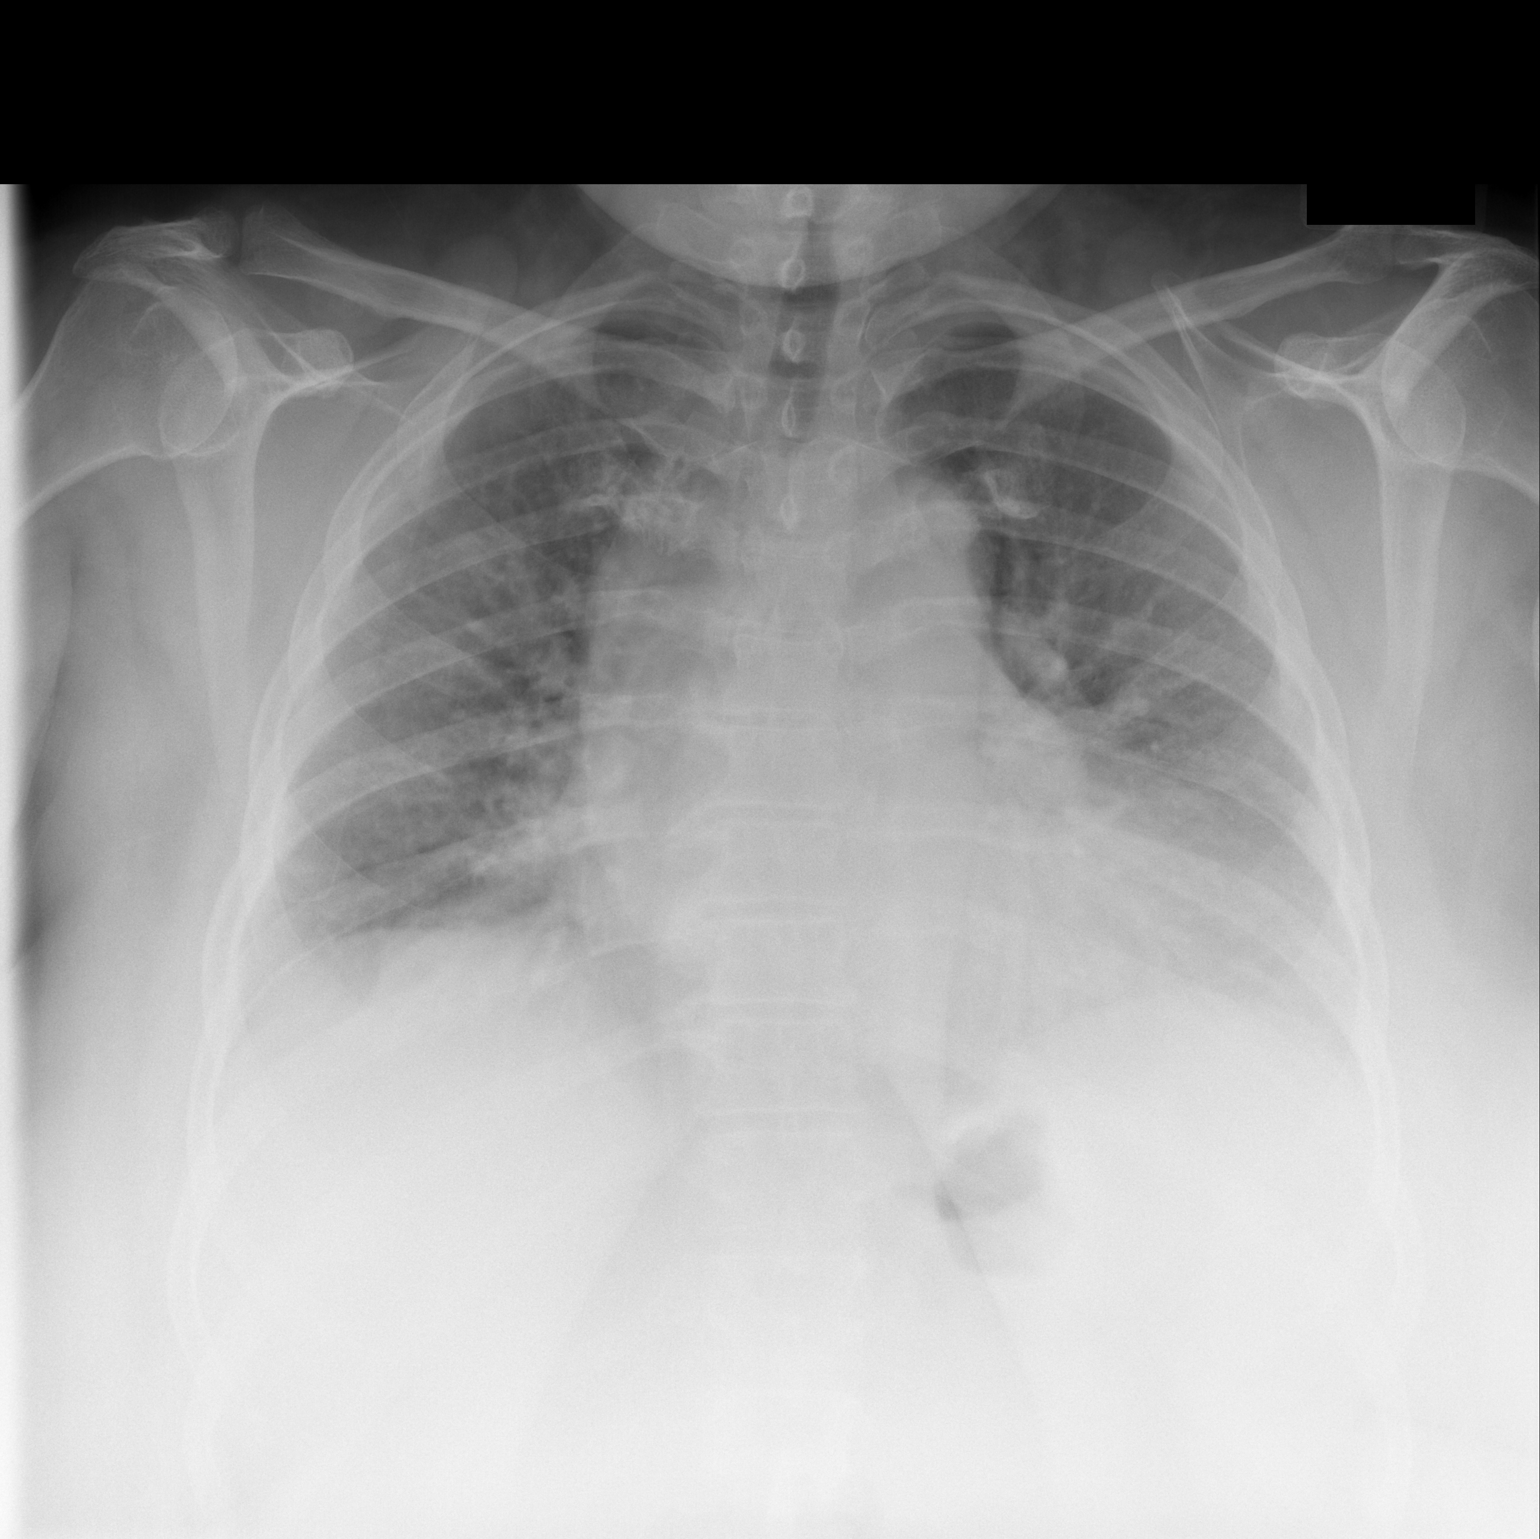

[w chest lat *]
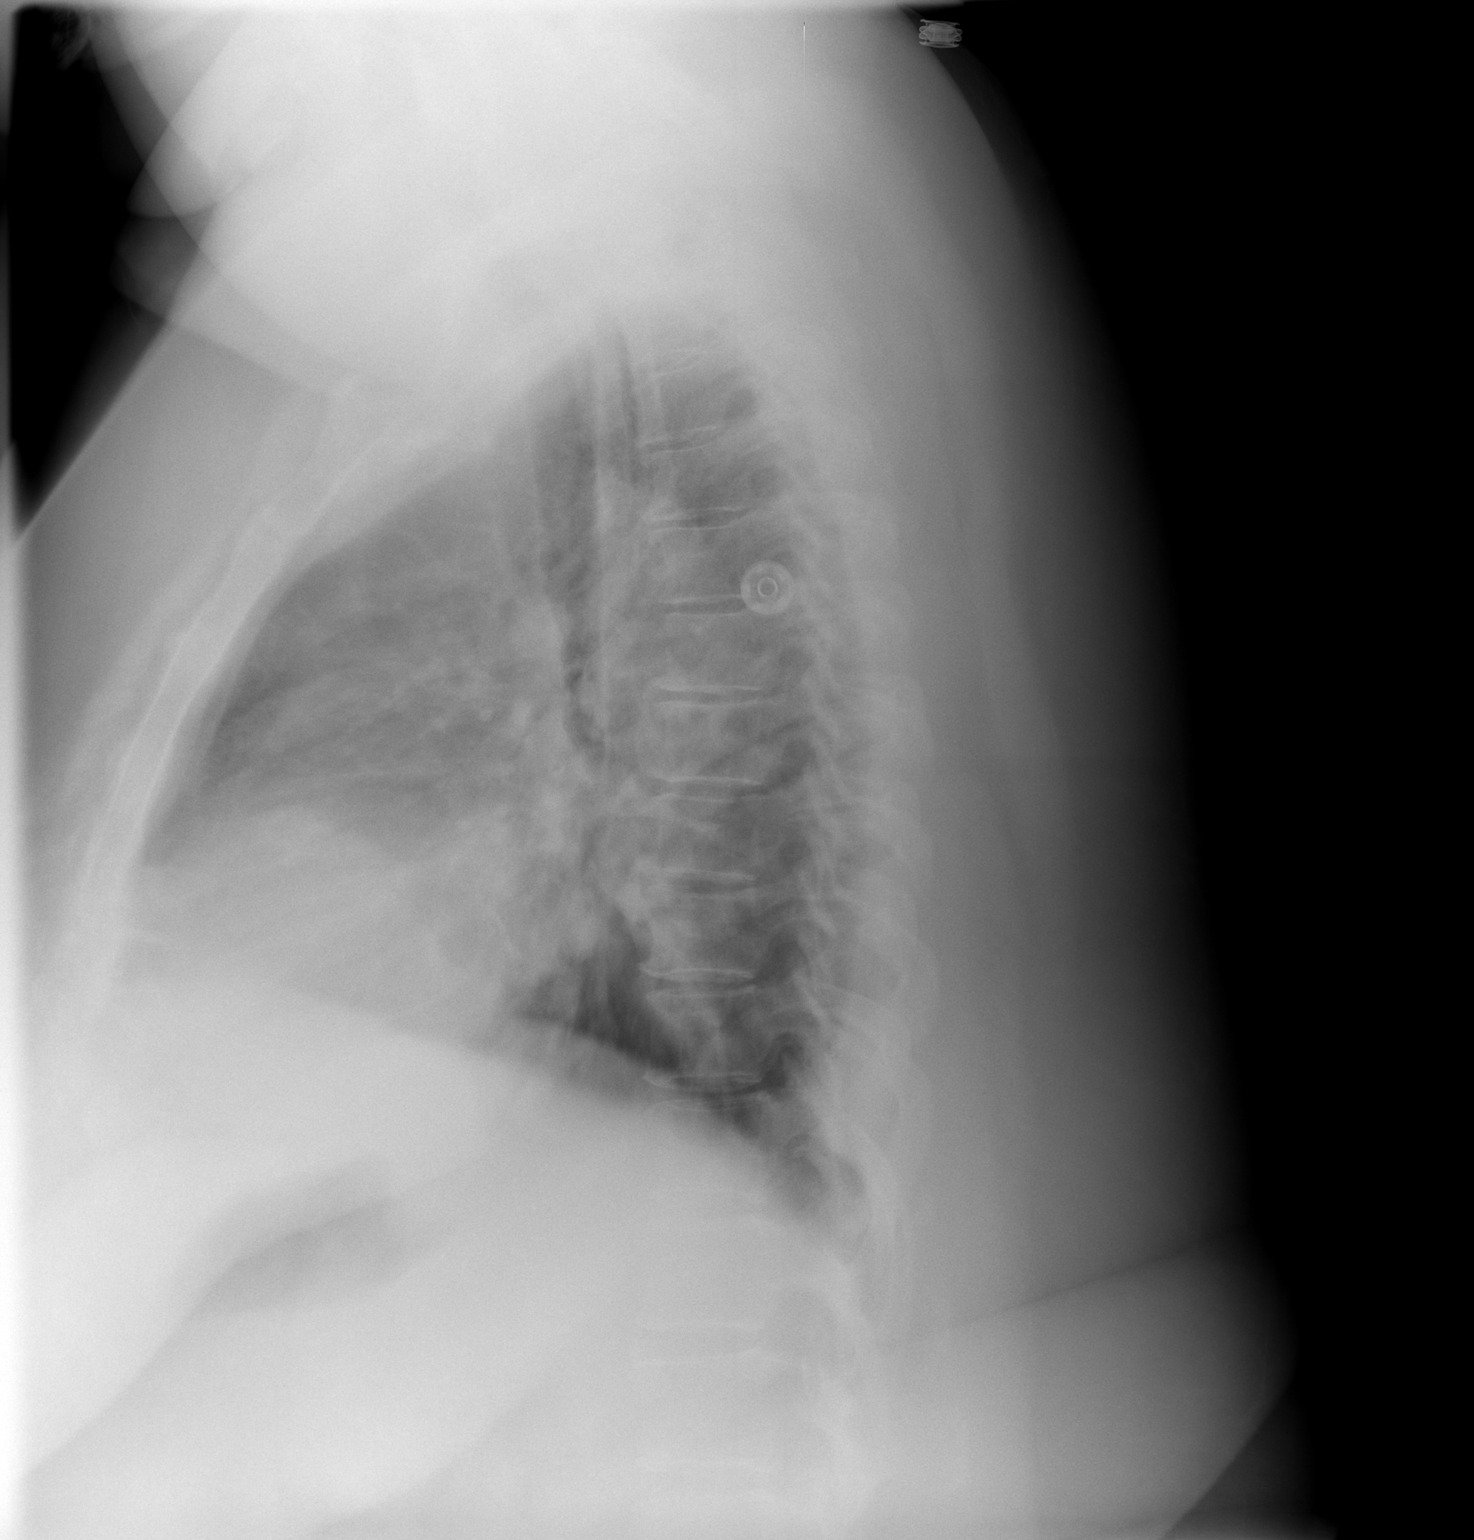

[2 of 2 positions shown; findings below may reference images not displayed]

FINDINGS: Cardiac enlargement is stable.

There are low lung volumes.

Atelectasis is noted at both lung bases.

No pleural effusions or interstitial edema noted.
IMPRESSION: 1.  Cardiac enlargement with low lung volumes.
2.  Bibasilar atelectasis.

## 2010-10-02 ENCOUNTER — Encounter: Payer: Medicaid Other | Admitting: Obstetrics and Gynecology

## 2010-10-07 IMAGING — CR DG CHEST 2V
2 series · 2 of 2 positions shown · non-contrast
Comparison: 03/09/2008

CLINICAL DATA: Short of breath, wheezing and history of asthma.

CHEST - 2 VIEW

[w chest lat *]
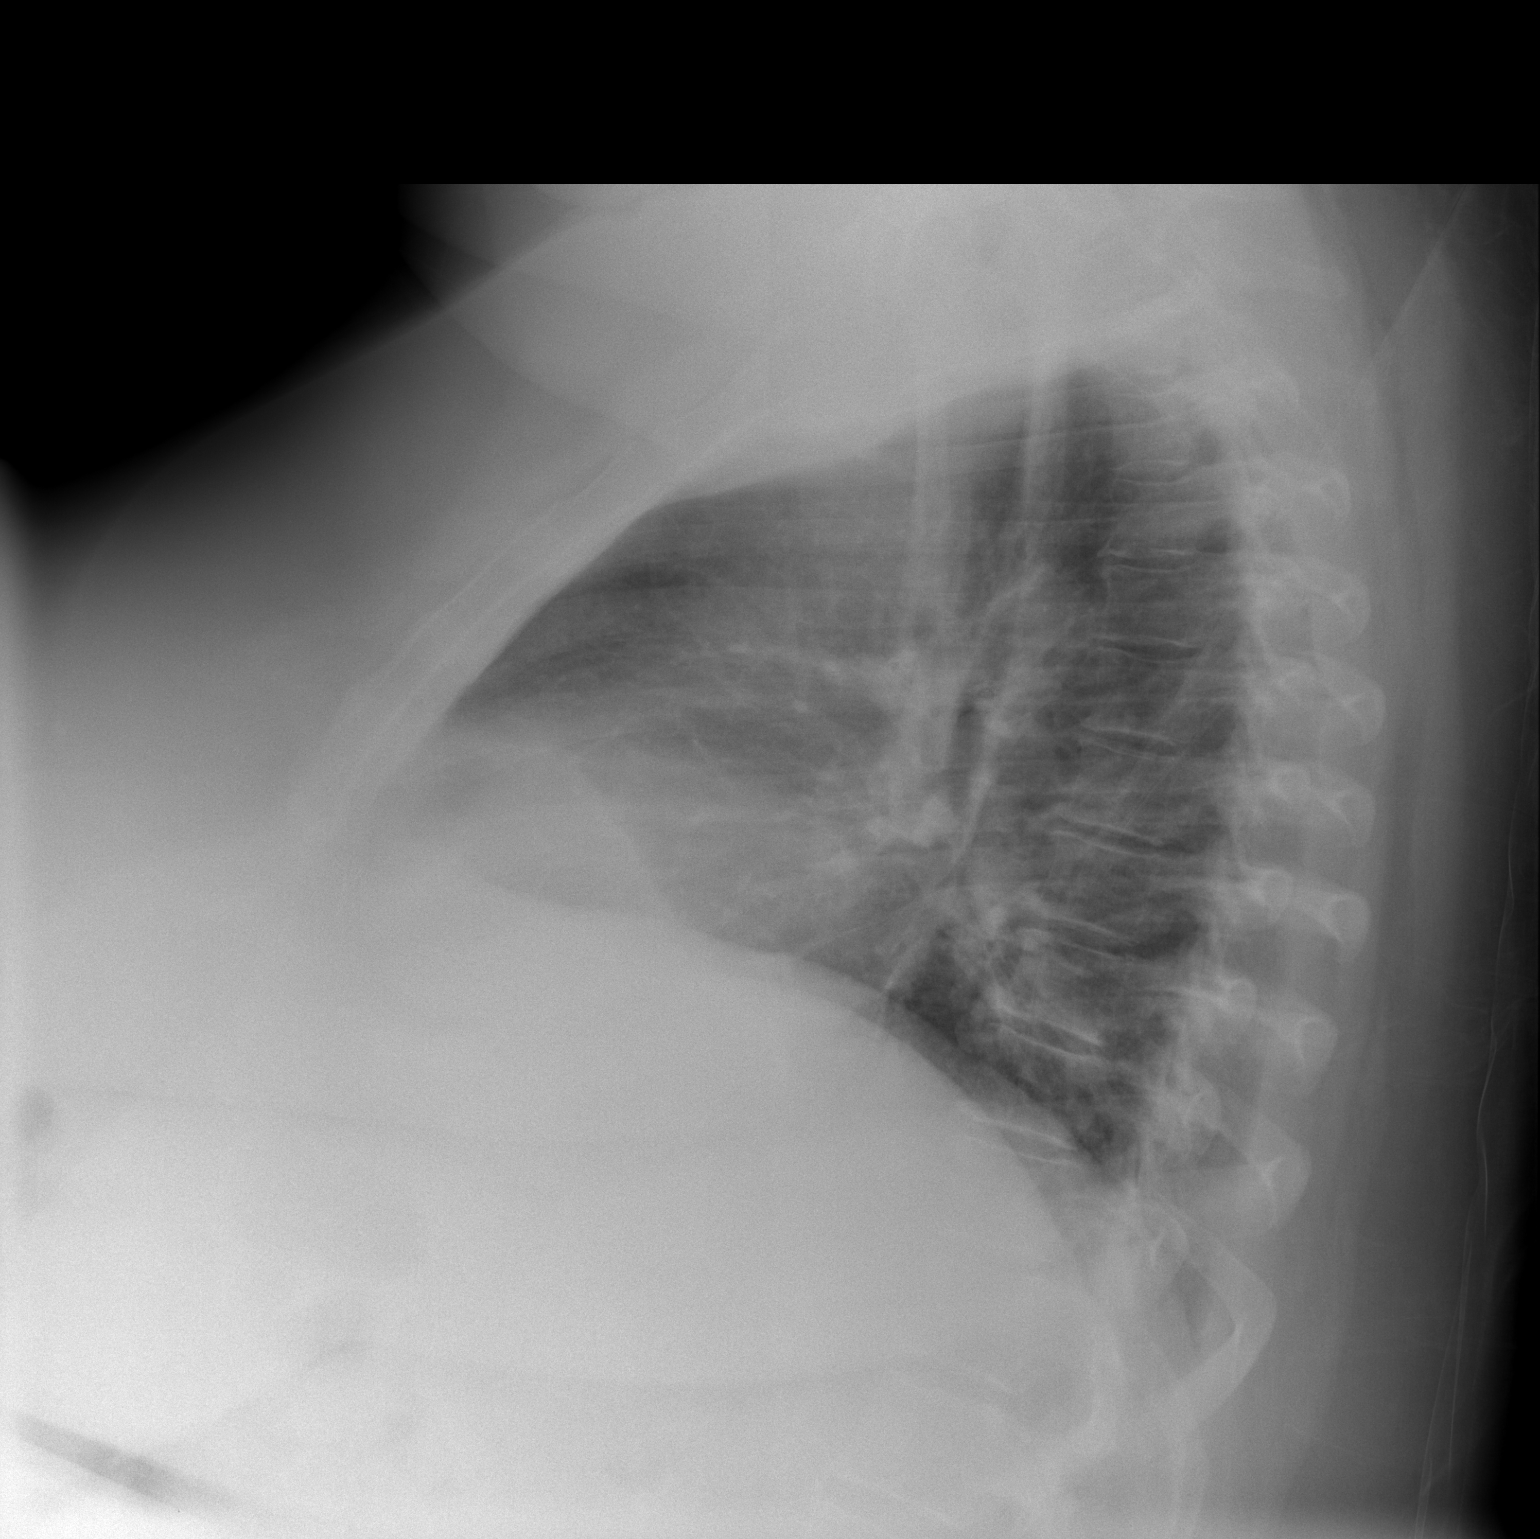

[view not recorded]
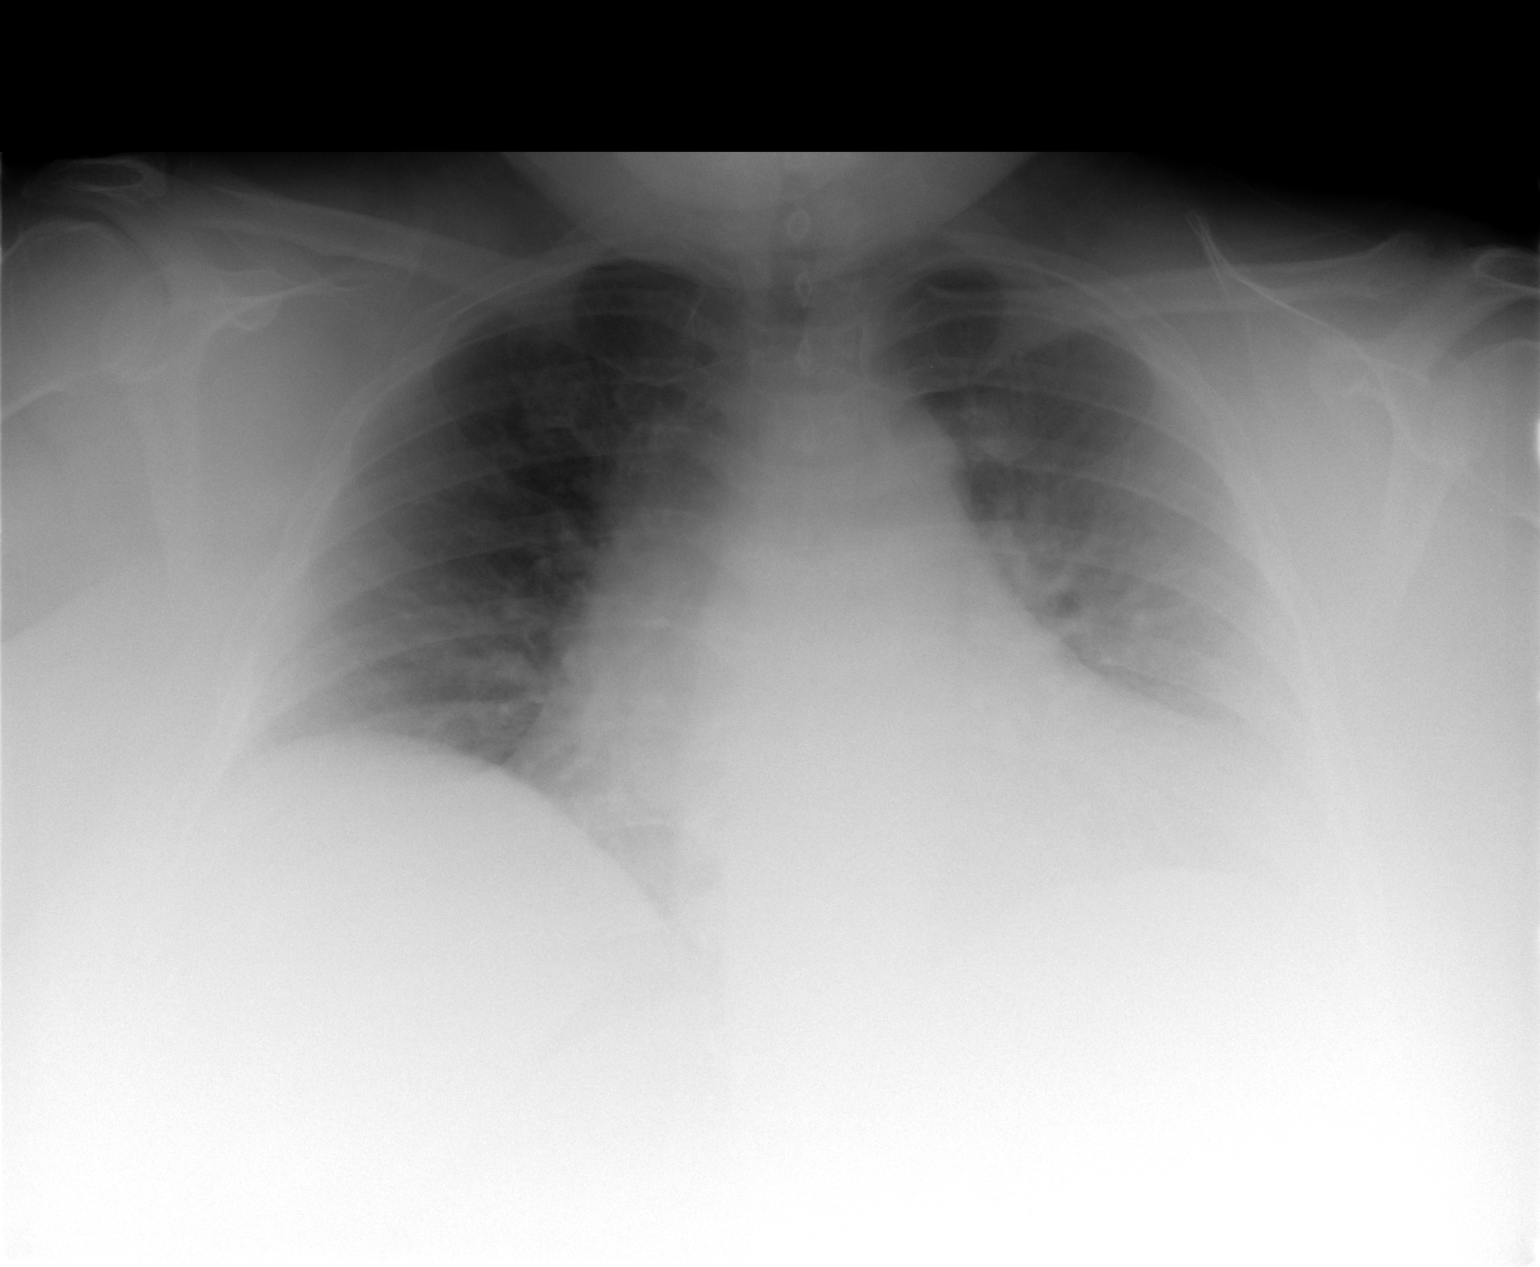

[2 of 2 positions shown; findings below may reference images not displayed]

FINDINGS: Lung volumes are low bilaterally.  No edema or infiltrate
identified.  Stable cardiomegaly.  No pleural effusions.
IMPRESSION: Low bilateral lung volumes.  No acute findings.

## 2010-10-09 IMAGING — CR DG CHEST 2V
2 series · 2 of 2 positions shown · non-contrast
Comparison: Chest x-ray of 03/20/2008

CLINICAL DATA: Asthma, wheezing

CHEST - 2 VIEW

[w chest pa *]
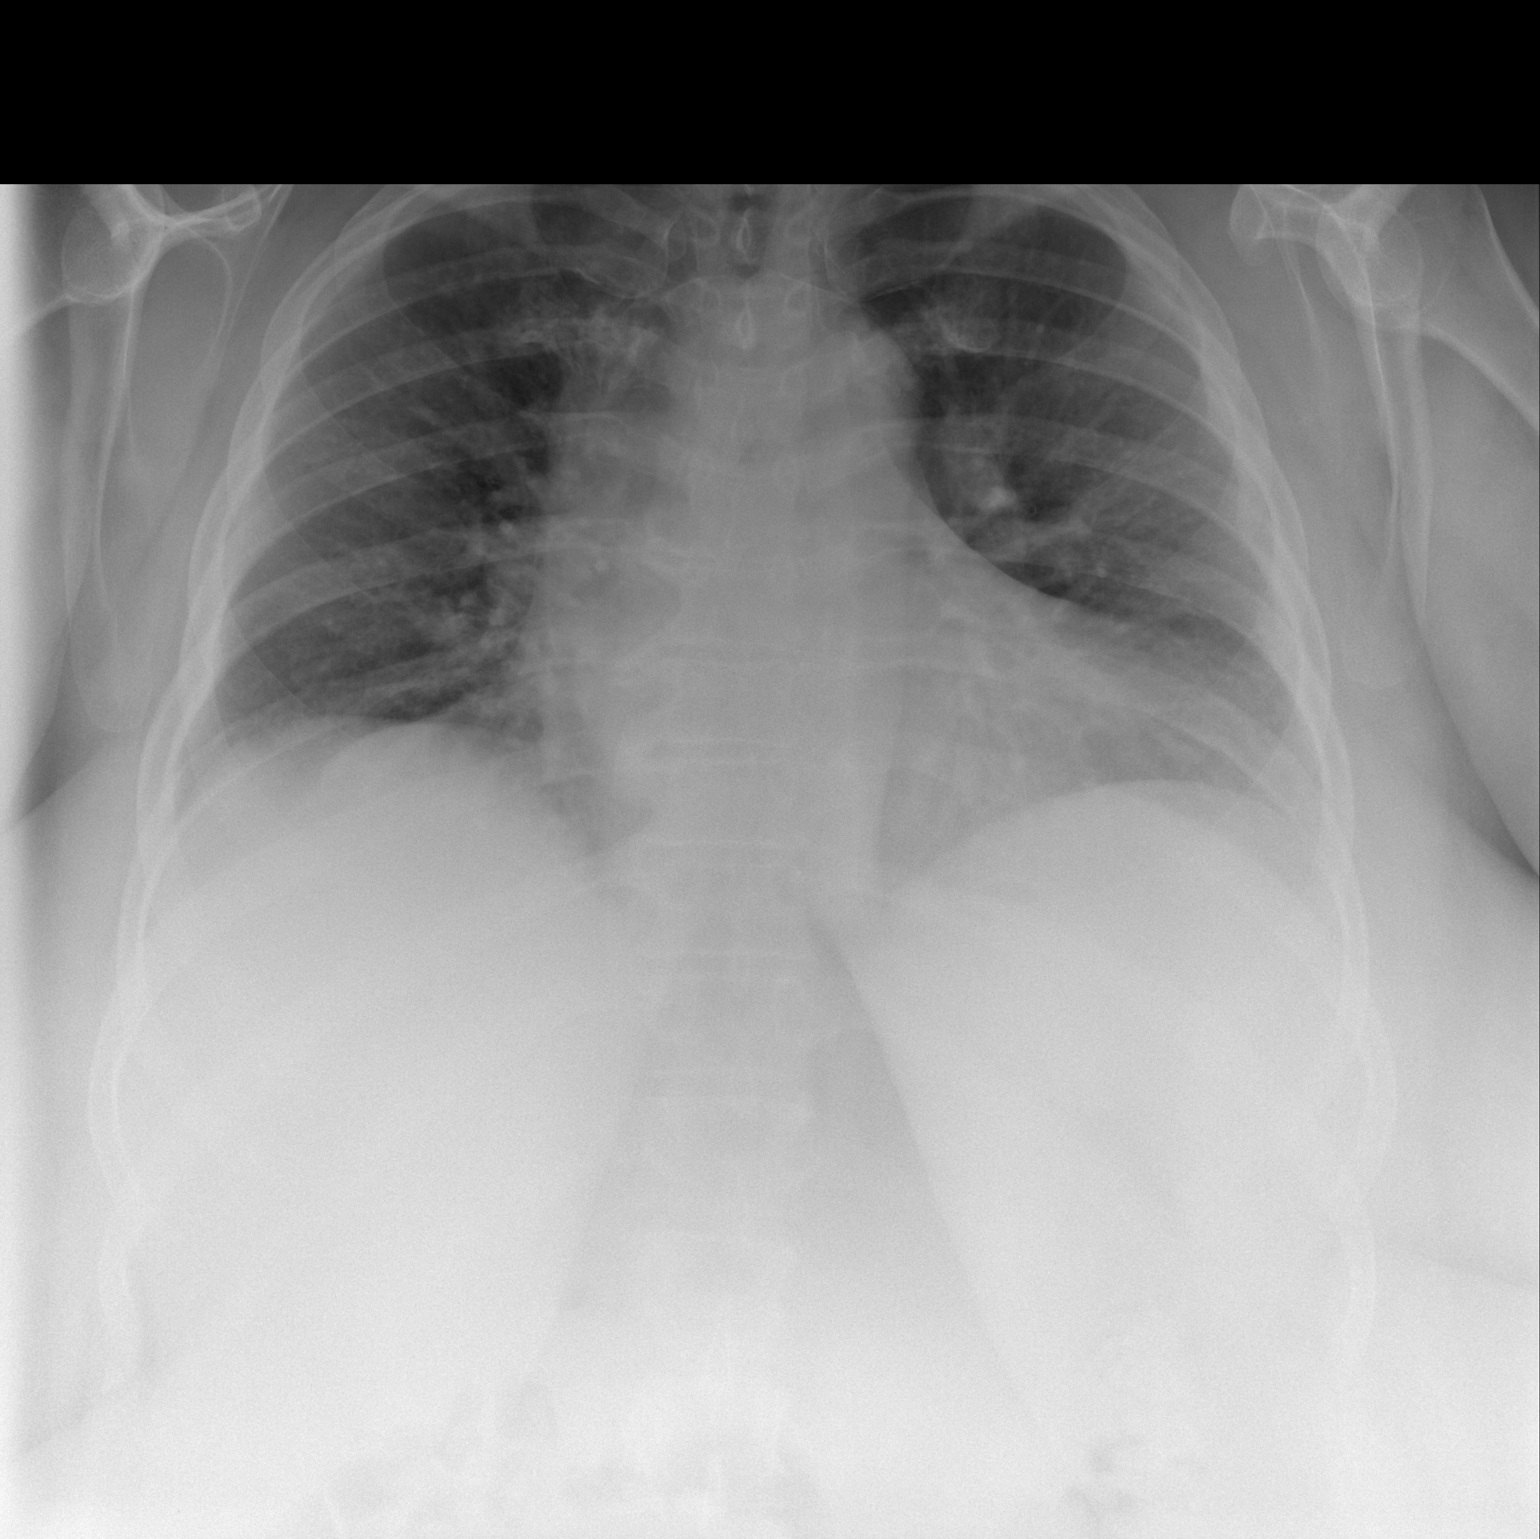

[w chest lat *]
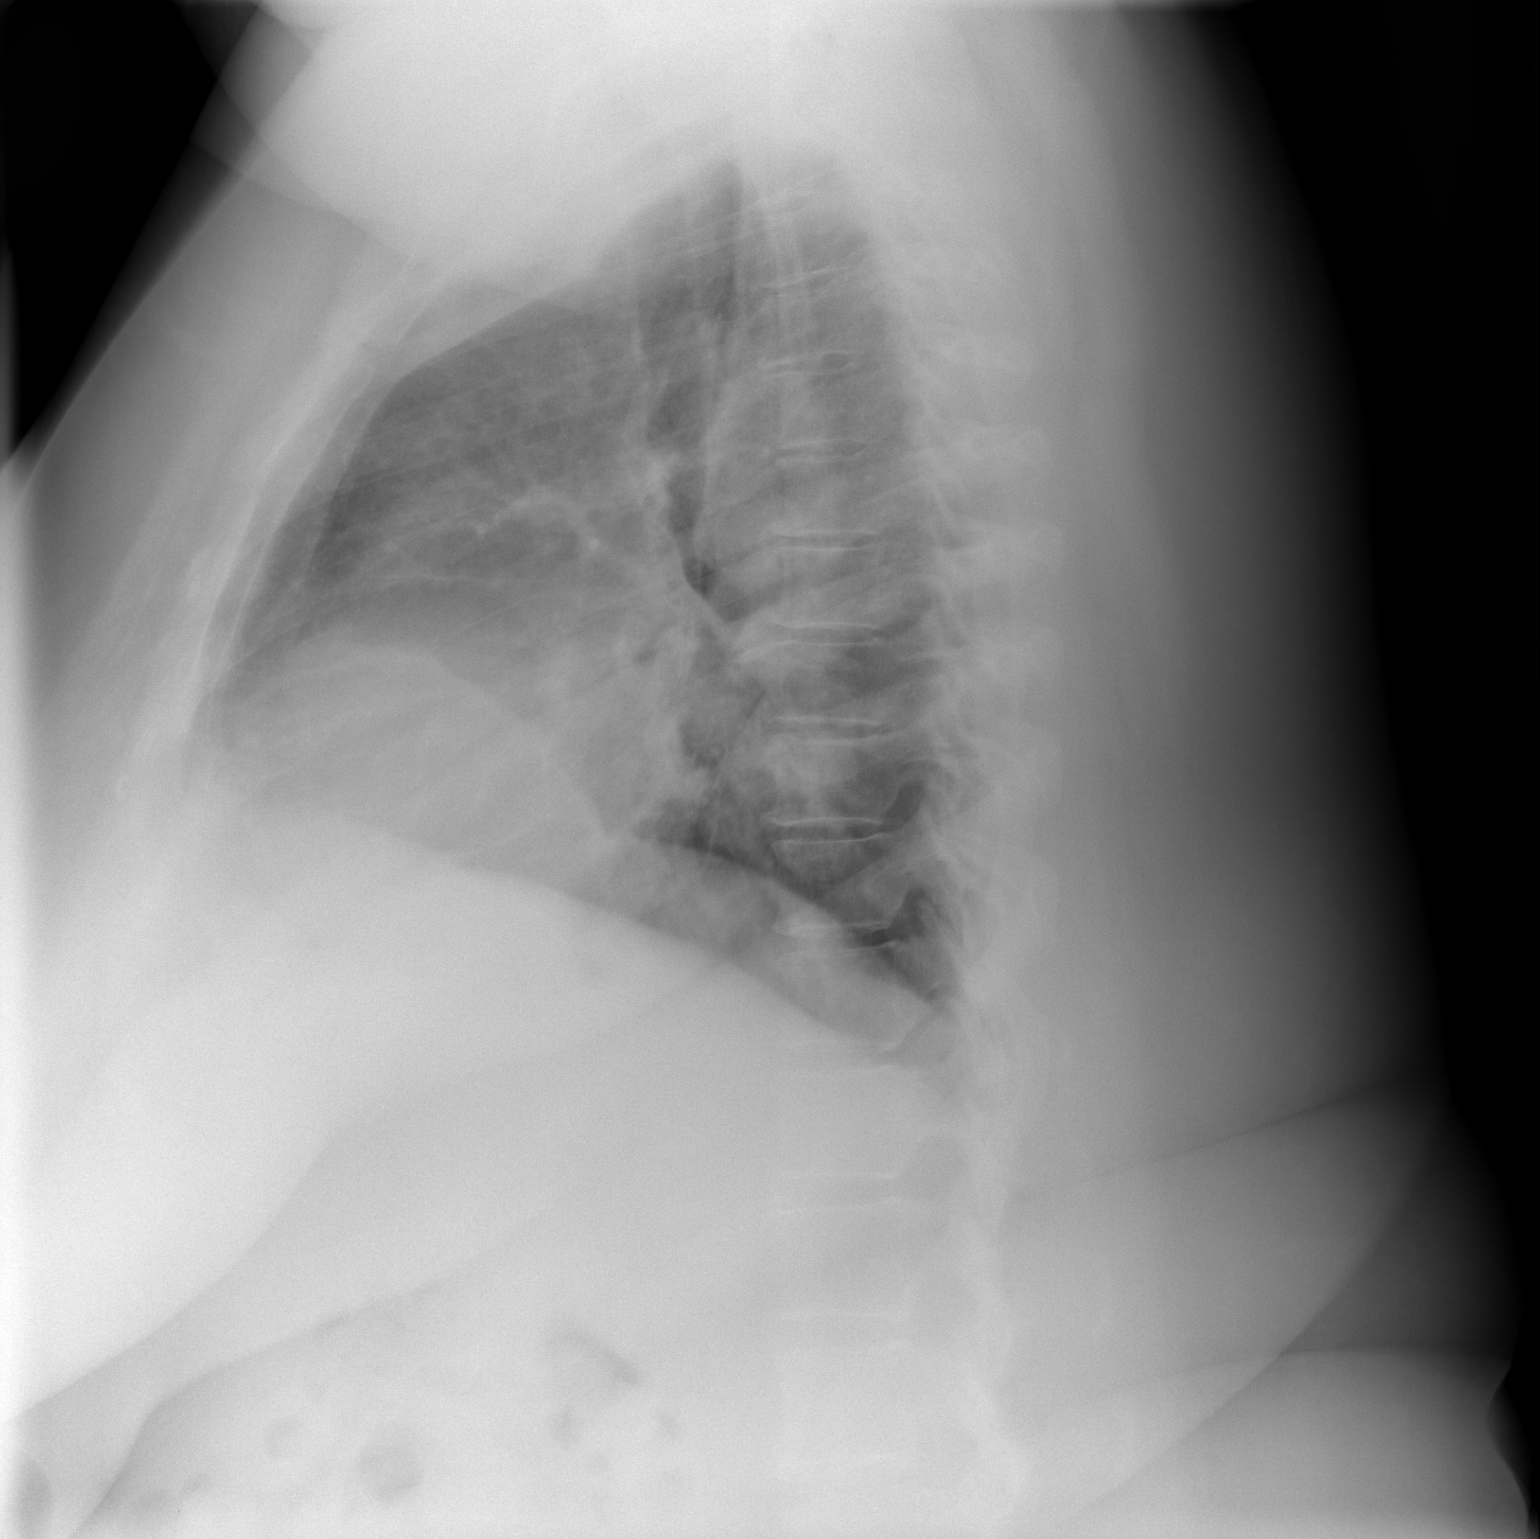

[2 of 2 positions shown; findings below may reference images not displayed]

FINDINGS: No active infiltrate or effusion is seen.  There is some
peribronchial thickening present which may indicate bronchitis.
The heart is mildly enlarged and stable.  There are degenerative
changes throughout the thoracic spine.
IMPRESSION: No pneumonia.  There is peribronchial thickening which may indicate
bronchitis.

## 2010-10-10 NOTE — Progress Notes (Signed)
This encounter was created in error - please disregard.

## 2010-10-19 IMAGING — CR DG CHEST 2V
2 series · 2 of 2 positions shown · non-contrast
Comparison: 03/22/2008

CLINICAL DATA: Shortness of breath.  Pain in both lungs.  Cough.
Congestion.  Shortness of breath for 2 days.  Fell 2 days ago,
injuring right ankle.

CHEST - 2 VIEW

[w chest lat *]
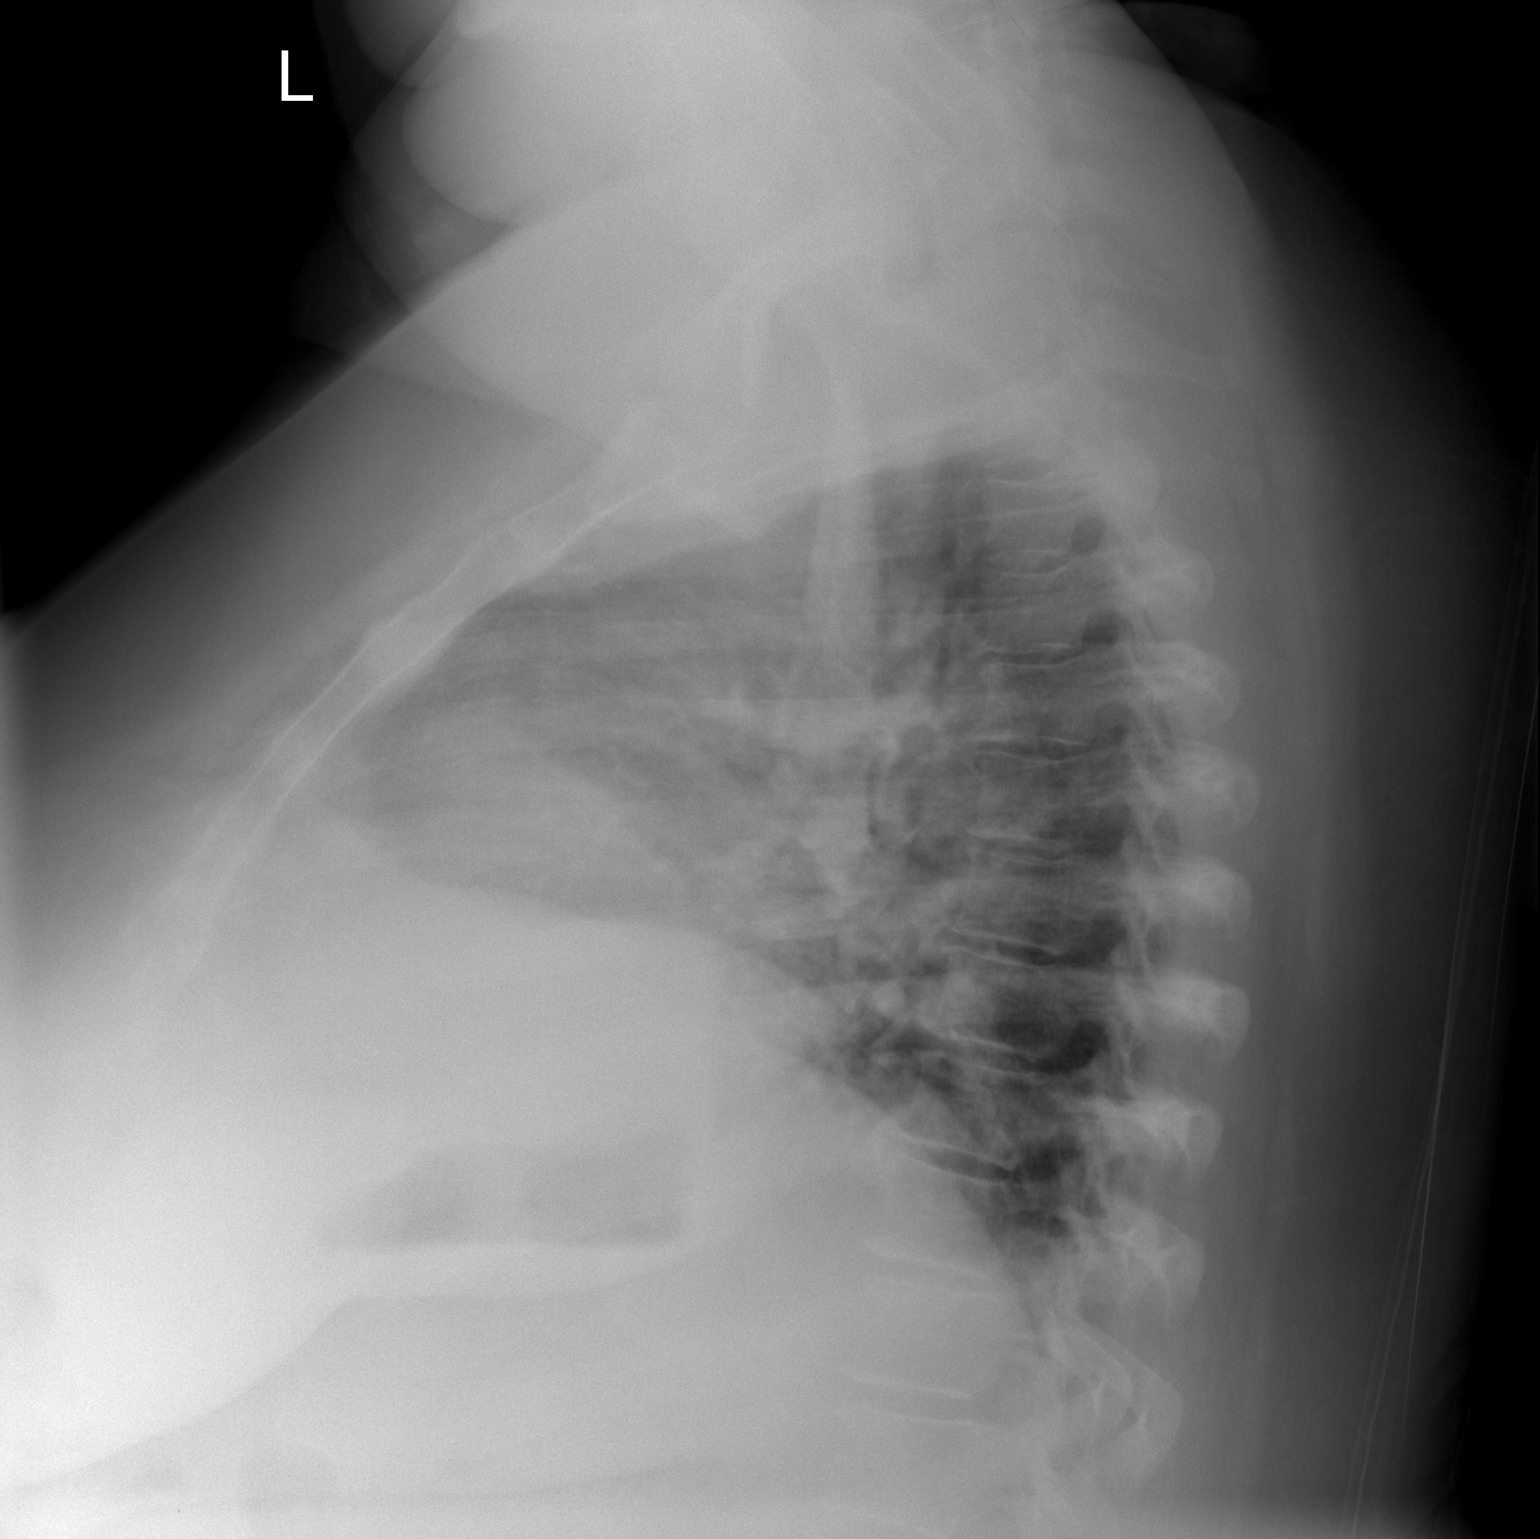

[w chest pa *]
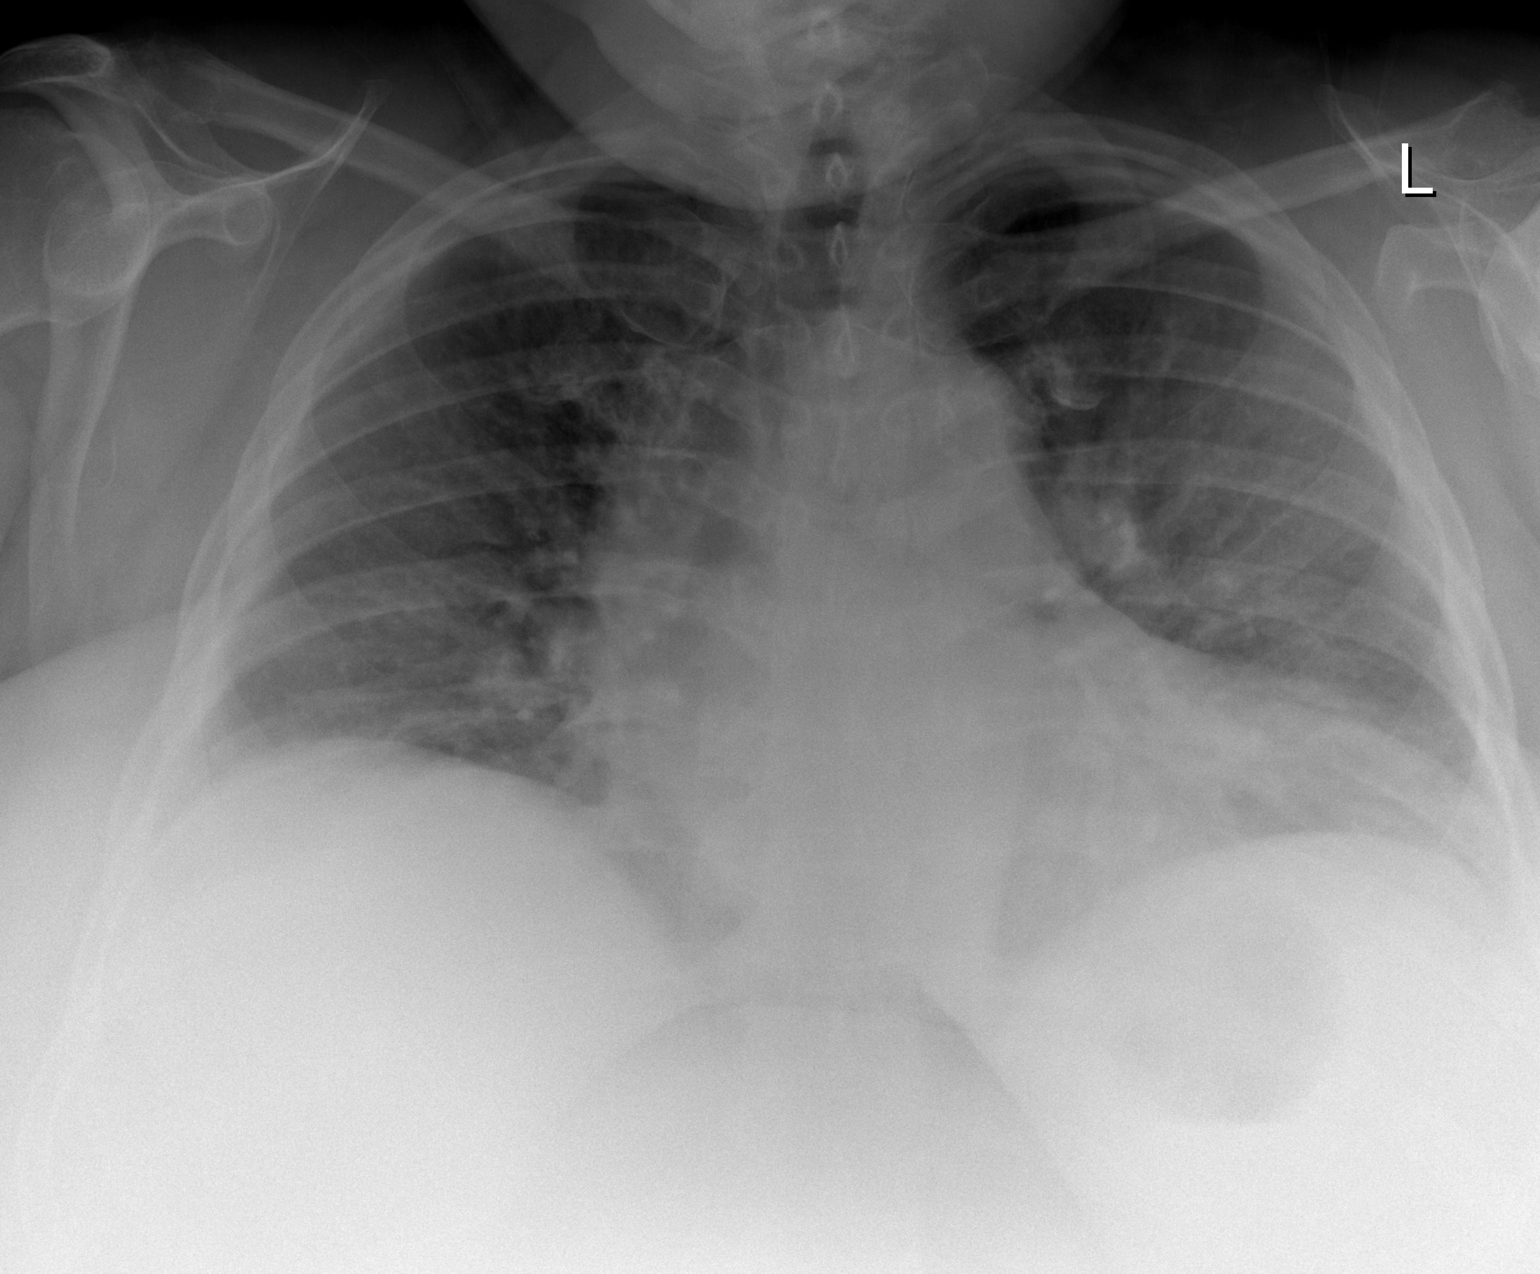

[2 of 2 positions shown; findings below may reference images not displayed]

FINDINGS: Film is made with shallow inflation.  The heart is
enlarged.  There is pulmonary vascular congestion.  There is mild
interstitial pulmonary edema.  No focal consolidations or pleural
effusions.  Degenerative changes are seen in the spine.
IMPRESSION: Cardiomegaly and mild pulmonary edema.

## 2010-10-25 IMAGING — CR DG CHEST 2V
2 series · 2 of 2 positions shown · non-contrast
Comparison: 04/03/2008

CLINICAL DATA: Shortness of breath

CHEST - 2 VIEW

[w chest pa *]
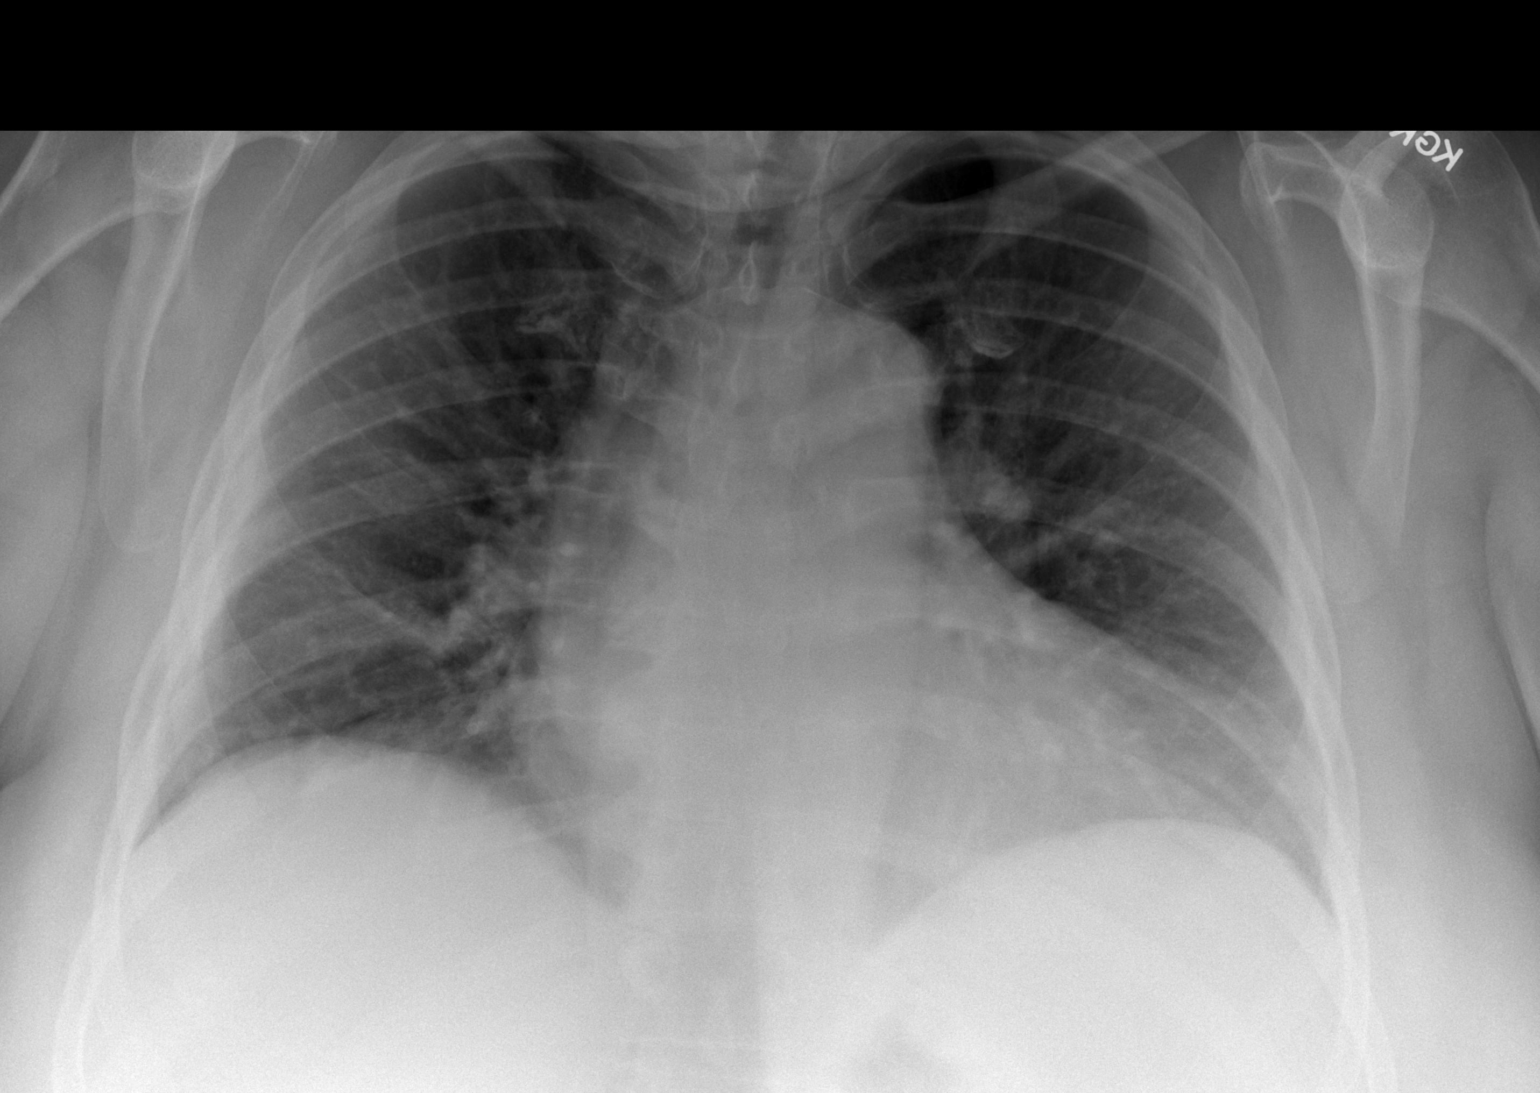

[w chest lat *]
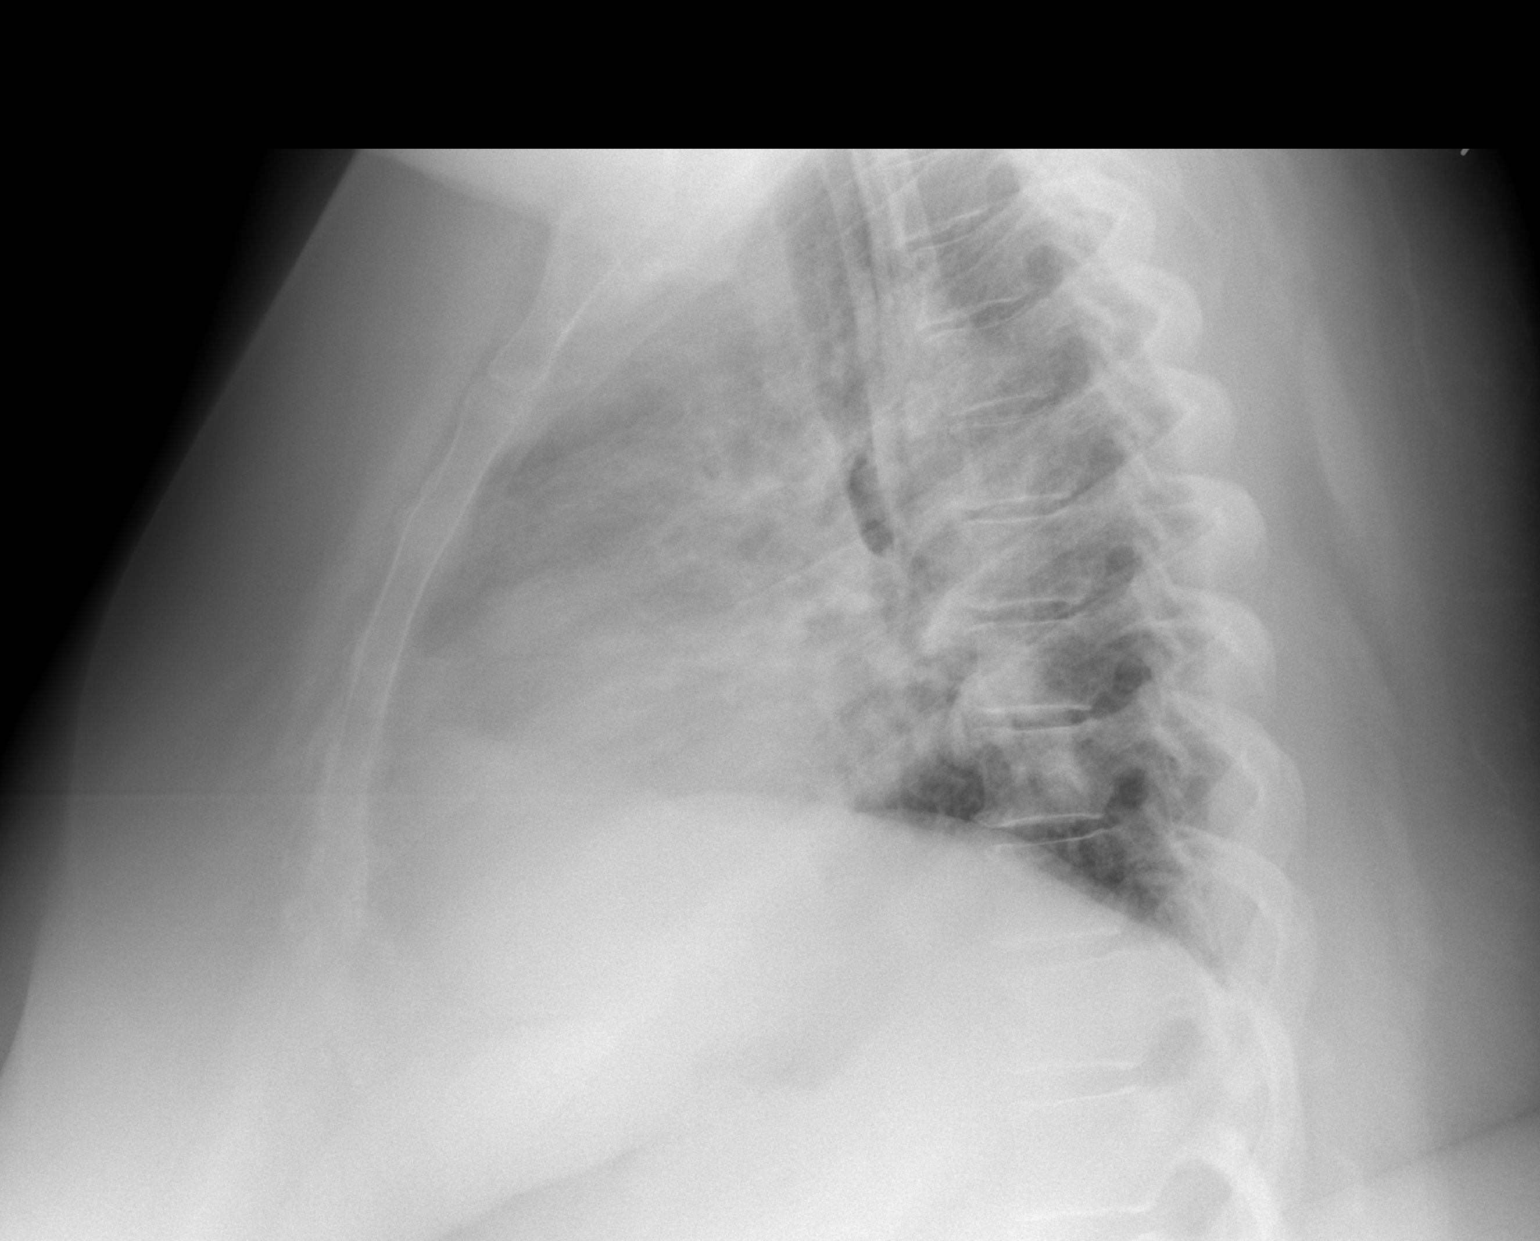

[2 of 2 positions shown; findings below may reference images not displayed]

FINDINGS: Cardiomegaly again noted.  Mild bilateral central
increased bronchial and interstitial markings without convincing
edema.  There is atelectasis or early infiltrate right perihilar.
Degenerative changes thoracic spine.
IMPRESSION: Cardiomegaly again noted.  Mild central increased bronchial and
interstitial markings without convincing edema.  Small amount of
atelectasis or early infiltrate right perihilar.  Degenerative
changes thoracic spine.

## 2010-11-18 ENCOUNTER — Emergency Department (HOSPITAL_COMMUNITY)
Admission: EM | Admit: 2010-11-18 | Discharge: 2010-11-19 | Disposition: A | Discharge status: Home / Self Care | Payer: Medicaid Other | Source: Home / Self Care | Attending: Emergency Medicine | Admitting: Emergency Medicine

## 2010-11-18 DIAGNOSIS — I10 Essential (primary) hypertension: Secondary | ICD-10-CM | POA: Insufficient documentation

## 2010-11-18 DIAGNOSIS — M129 Arthropathy, unspecified: Secondary | ICD-10-CM | POA: Insufficient documentation

## 2010-11-18 DIAGNOSIS — R51 Headache: Secondary | ICD-10-CM | POA: Insufficient documentation

## 2010-11-18 DIAGNOSIS — Z8639 Personal history of other endocrine, nutritional and metabolic disease: Secondary | ICD-10-CM | POA: Insufficient documentation

## 2010-11-18 DIAGNOSIS — R42 Dizziness and giddiness: Secondary | ICD-10-CM | POA: Insufficient documentation

## 2010-11-18 DIAGNOSIS — F411 Generalized anxiety disorder: Secondary | ICD-10-CM | POA: Insufficient documentation

## 2010-11-18 DIAGNOSIS — J45909 Unspecified asthma, uncomplicated: Secondary | ICD-10-CM | POA: Insufficient documentation

## 2010-11-18 DIAGNOSIS — R609 Edema, unspecified: Secondary | ICD-10-CM | POA: Insufficient documentation

## 2010-11-18 DIAGNOSIS — IMO0002 Reserved for concepts with insufficient information to code with codable children: Secondary | ICD-10-CM | POA: Insufficient documentation

## 2010-11-18 DIAGNOSIS — Z79899 Other long term (current) drug therapy: Secondary | ICD-10-CM | POA: Insufficient documentation

## 2010-11-18 DIAGNOSIS — R9431 Abnormal electrocardiogram [ECG] [EKG]: Secondary | ICD-10-CM | POA: Insufficient documentation

## 2010-11-18 DIAGNOSIS — M7989 Other specified soft tissue disorders: Secondary | ICD-10-CM | POA: Insufficient documentation

## 2010-11-18 DIAGNOSIS — R079 Chest pain, unspecified: Secondary | ICD-10-CM | POA: Insufficient documentation

## 2010-11-18 DIAGNOSIS — Z862 Personal history of diseases of the blood and blood-forming organs and certain disorders involving the immune mechanism: Secondary | ICD-10-CM | POA: Insufficient documentation

## 2010-11-18 LAB — DIFFERENTIAL
Eosinophils Absolute: 0.4 10*3/uL (ref 0.0–0.7)
Lymphocytes Relative: 23 % (ref 12–46)
Lymphs Abs: 2.2 10*3/uL (ref 0.7–4.0)
Monocytes Relative: 6 % (ref 3–12)
Neutrophils Relative %: 67 % (ref 43–77)

## 2010-11-18 LAB — BASIC METABOLIC PANEL
BUN: 15 mg/dL (ref 6–23)
CO2: 33 mEq/L — ABNORMAL HIGH (ref 19–32)
Calcium: 8.9 mg/dL (ref 8.4–10.5)
Creatinine, Ser: 0.97 mg/dL (ref 0.50–1.10)
Glucose, Bld: 127 mg/dL — ABNORMAL HIGH (ref 70–99)

## 2010-11-18 LAB — CBC
HCT: 42.5 % (ref 36.0–46.0)
Hemoglobin: 13.4 g/dL (ref 12.0–15.0)
MCH: 28.2 pg (ref 26.0–34.0)
MCV: 89.3 fL (ref 78.0–100.0)
Platelets: 303 10*3/uL (ref 150–400)
RBC: 4.76 MIL/uL (ref 3.87–5.11)

## 2010-11-19 ENCOUNTER — Emergency Department (HOSPITAL_COMMUNITY): Payer: Medicaid Other

## 2010-11-19 ENCOUNTER — Emergency Department (HOSPITAL_COMMUNITY)
Admission: EM | Admit: 2010-11-19 | Discharge: 2010-11-19 | Disposition: A | Discharge status: Home / Self Care | Payer: Medicaid Other | Attending: Emergency Medicine | Admitting: Emergency Medicine

## 2010-11-19 LAB — URINALYSIS, ROUTINE W REFLEX MICROSCOPIC
Glucose, UA: NEGATIVE mg/dL
Ketones, ur: NEGATIVE mg/dL
Leukocytes, UA: NEGATIVE
Specific Gravity, Urine: 1.035 — ABNORMAL HIGH (ref 1.005–1.030)
pH: 5 (ref 5.0–8.0)

## 2010-11-19 LAB — POCT I-STAT, CHEM 8
BUN: 18 mg/dL (ref 6–23)
Creatinine, Ser: 1.1 mg/dL (ref 0.50–1.10)
Glucose, Bld: 108 mg/dL — ABNORMAL HIGH (ref 70–99)
Hemoglobin: 15.3 g/dL — ABNORMAL HIGH (ref 12.0–15.0)
TCO2: 29 mmol/L (ref 0–100)

## 2010-11-21 LAB — URINE CULTURE: Culture  Setup Time: 201208290130

## 2010-11-27 IMAGING — CT CT HEAD W/O CM
1 of 3 series · 16 of 30 positions shown, 20 images · non-contrast
Comparison: 07/05/2007

CLINICAL DATA: Syncope.  Headache.  Uncontrolled hypertension.

CT HEAD WITHOUT CONTRAST
TECHNIQUE: Contiguous axial images were obtained from the base of
the skull through the vertex without contrast

[Series 2: head routine 4.8 h37s · axial · 0.45mm/px · z∈[-48,+82]mm · 16 of 30 slices shown, 20 images]
[im 2/30  brain]
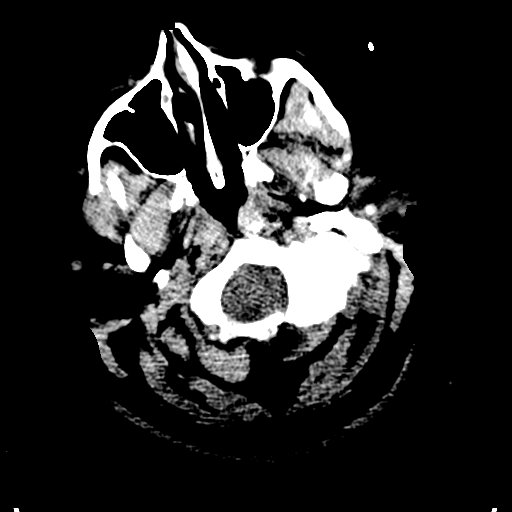
[im 2/30  bone]
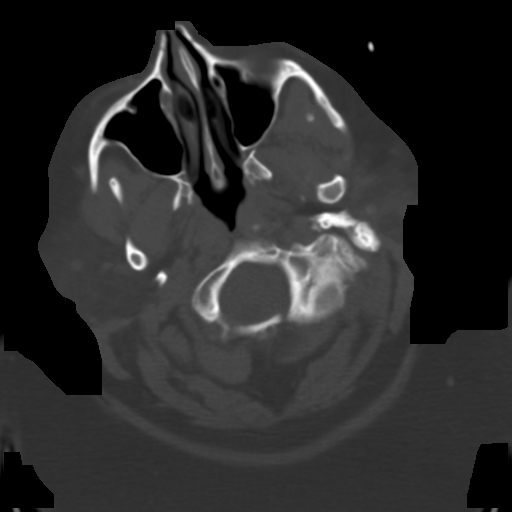
[im 4/30  brain]
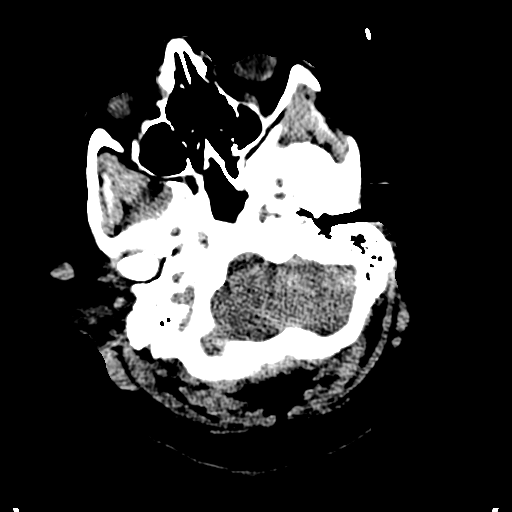
[im 6/30  brain]
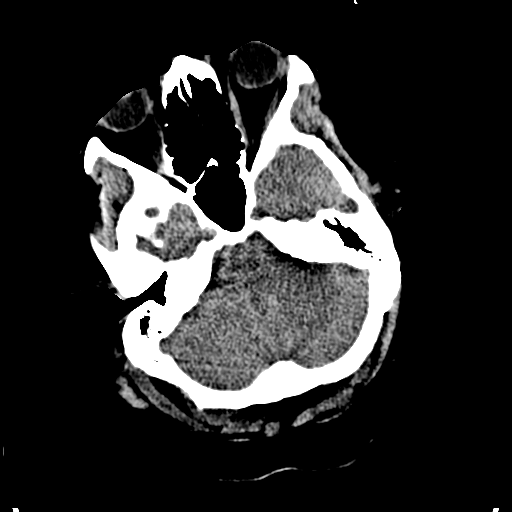
[im 7/30  brain]
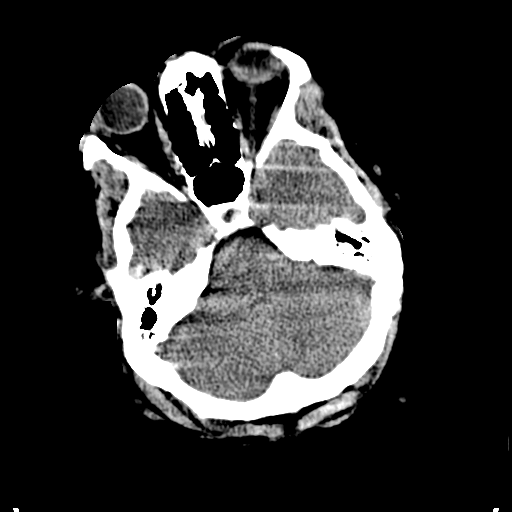
[im 9/30  brain]
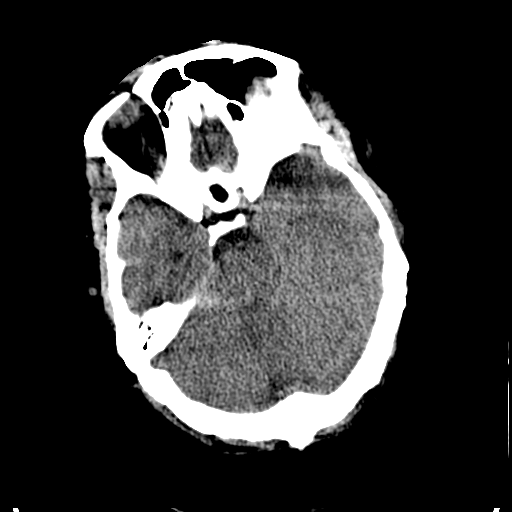
[im 9/30  bone]
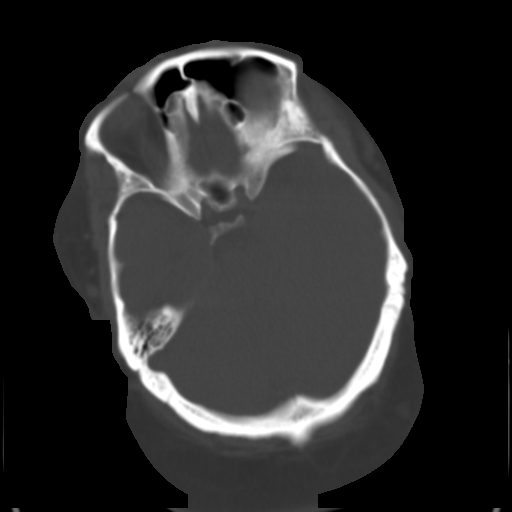
[im 11/30  brain]
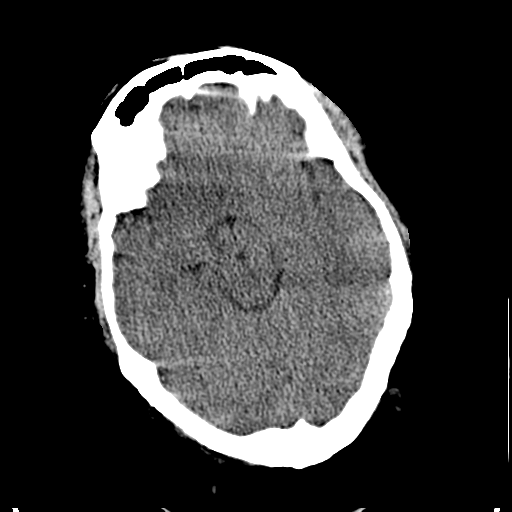
[im 12/30  brain]
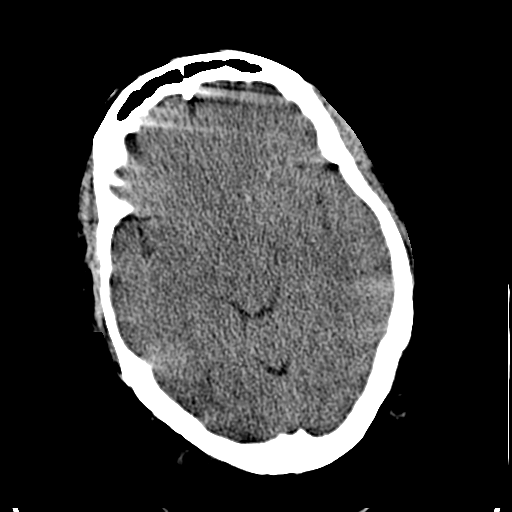
[im 14/30  brain]
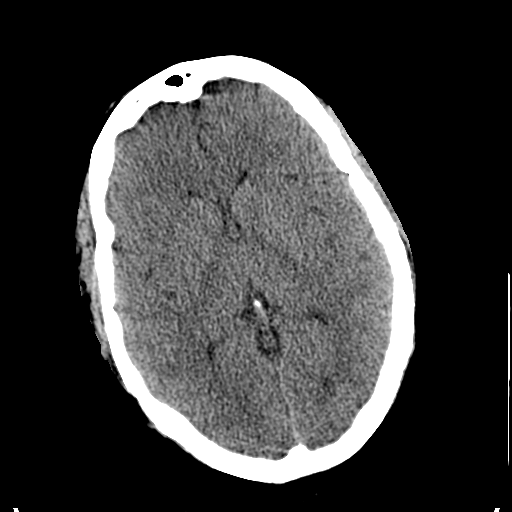
[im 16/30  brain]
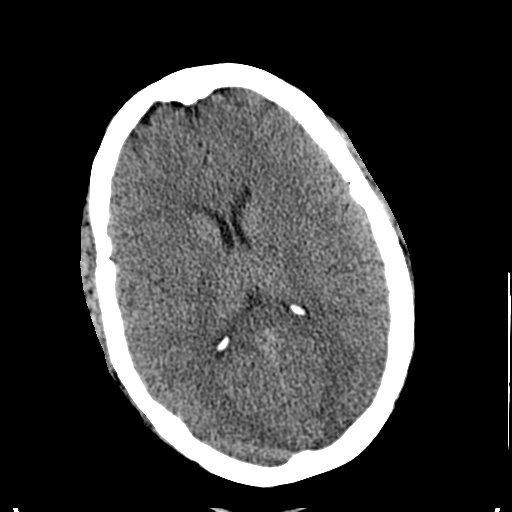
[im 16/30  bone]
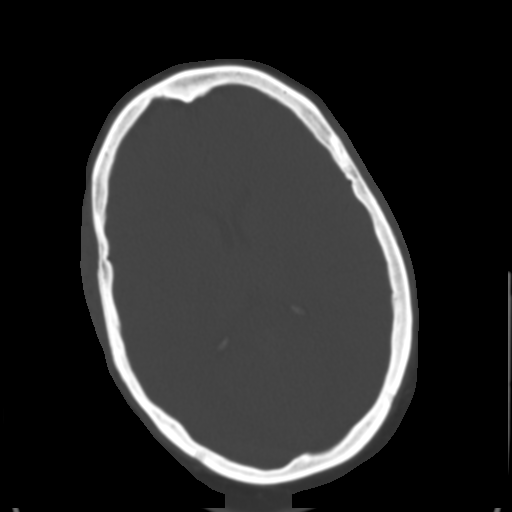
[im 18/30  brain]
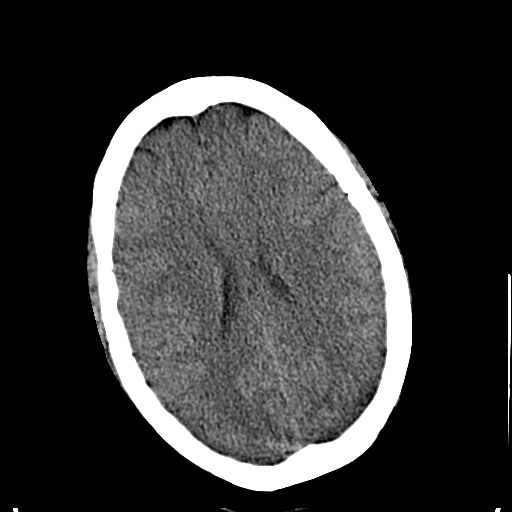
[im 19/30  brain]
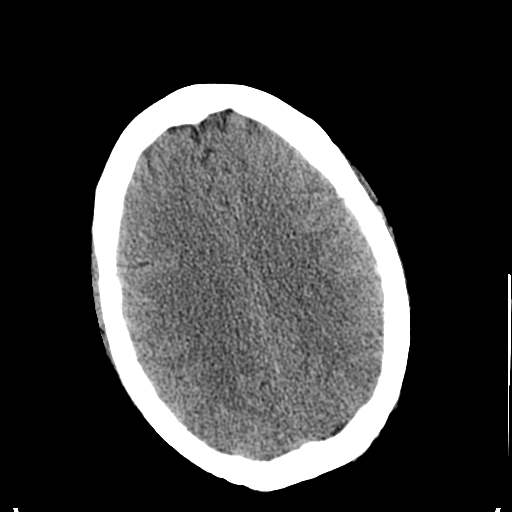
[im 21/30  brain]
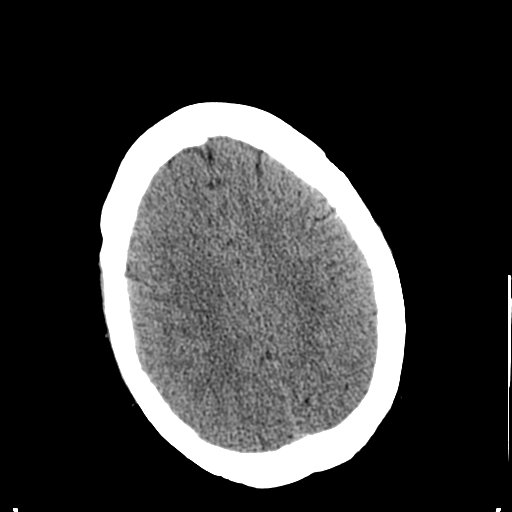
[im 23/30  brain]
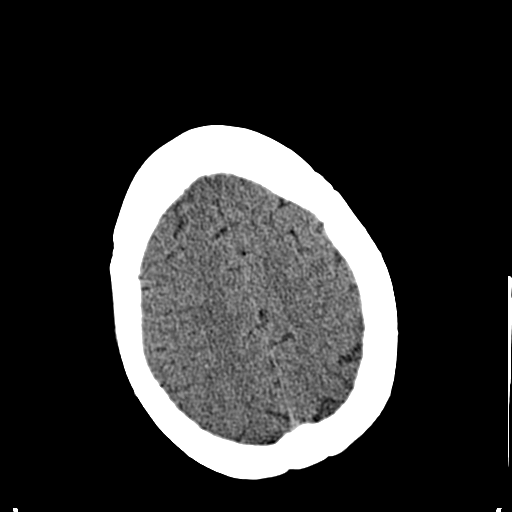
[im 23/30  bone]
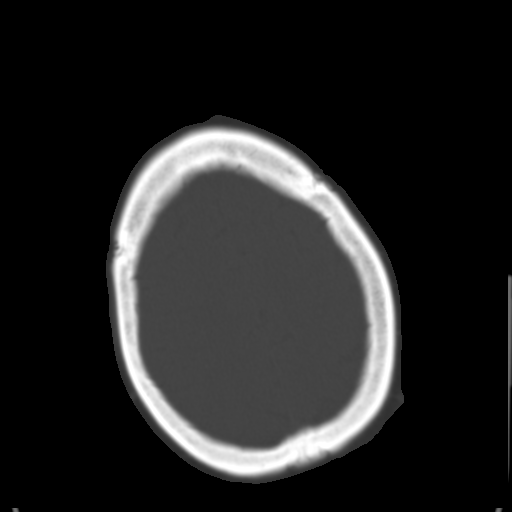
[im 24/30  brain]
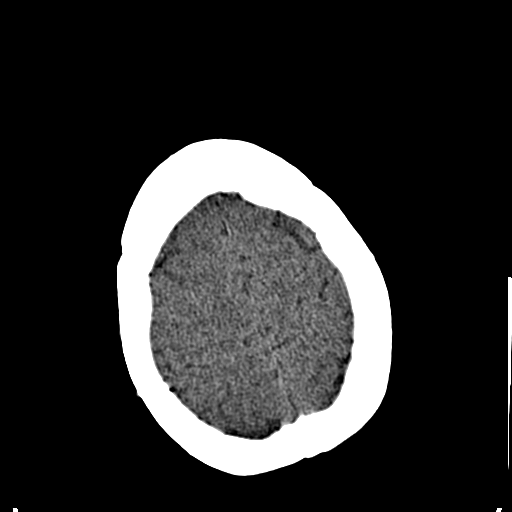
[im 26/30  brain]
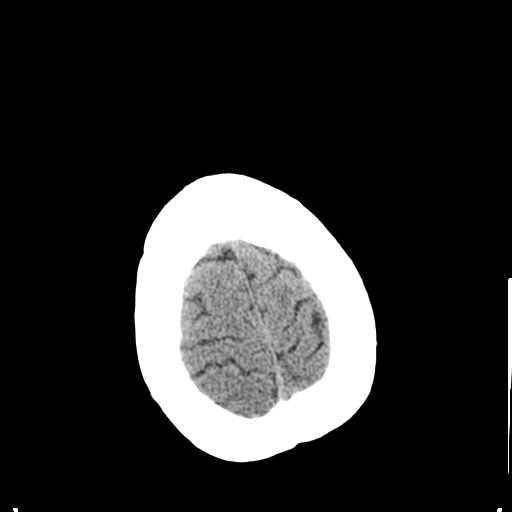
[im 28/30  brain]
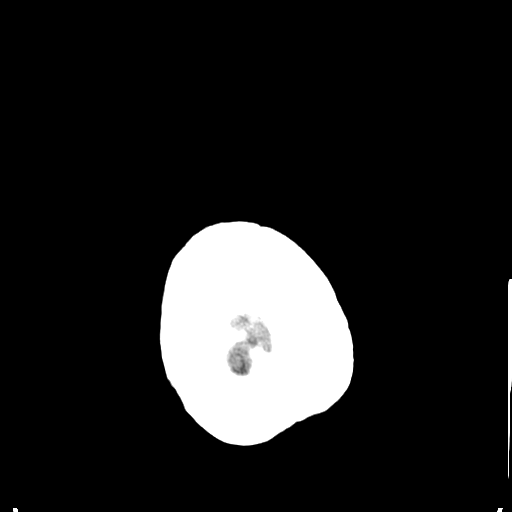

[16 of 30 positions shown; findings below may reference images not displayed]

FINDINGS: There is no evidence of intracranial hemorrhage, brain
edema, or other signs of acute infarction.  There is no evidence of
intracranial mass lesions, or mass effect.  No abnormal extraaxial
fluid collections are identified.  There is no evidence of
hydrocephalus, or other significant intracranial abnormality.  No
skull abnormality identified.
IMPRESSION: Negative non-contrast head CT.

## 2010-12-09 ENCOUNTER — Emergency Department (HOSPITAL_COMMUNITY)
Admission: EM | Admit: 2010-12-09 | Discharge: 2010-12-09 | Disposition: A | Discharge status: Home / Self Care | Payer: Medicaid Other | Attending: Emergency Medicine | Admitting: Emergency Medicine

## 2010-12-09 DIAGNOSIS — Q828 Other specified congenital malformations of skin: Secondary | ICD-10-CM | POA: Insufficient documentation

## 2010-12-09 DIAGNOSIS — G4730 Sleep apnea, unspecified: Secondary | ICD-10-CM | POA: Insufficient documentation

## 2010-12-09 DIAGNOSIS — G8929 Other chronic pain: Secondary | ICD-10-CM | POA: Insufficient documentation

## 2010-12-09 DIAGNOSIS — I10 Essential (primary) hypertension: Secondary | ICD-10-CM | POA: Insufficient documentation

## 2010-12-09 DIAGNOSIS — R209 Unspecified disturbances of skin sensation: Secondary | ICD-10-CM | POA: Insufficient documentation

## 2010-12-09 DIAGNOSIS — J45909 Unspecified asthma, uncomplicated: Secondary | ICD-10-CM | POA: Insufficient documentation

## 2010-12-09 DIAGNOSIS — M7989 Other specified soft tissue disorders: Secondary | ICD-10-CM | POA: Insufficient documentation

## 2010-12-10 ENCOUNTER — Emergency Department (HOSPITAL_COMMUNITY): Payer: Medicaid Other

## 2010-12-10 LAB — BASIC METABOLIC PANEL
CO2: 33 mEq/L — ABNORMAL HIGH (ref 19–32)
Chloride: 99 mEq/L (ref 96–112)
Creatinine, Ser: 1.02 mg/dL (ref 0.50–1.10)
GFR calc Af Amer: >60 mL/min (ref >60)
Potassium: 4.2 mEq/L (ref 3.5–5.1)
Sodium: 139 mEq/L (ref 135–145)

## 2010-12-10 LAB — DIFFERENTIAL
Basophils Absolute: 0 10*3/uL (ref 0.0–0.1)
Basophils Relative: 0 % (ref 0–1)
Eosinophils Absolute: 0.3 10*3/uL (ref 0.0–0.7)
Eosinophils Relative: 3 % (ref 0–5)
Lymphs Abs: 2.5 10*3/uL (ref 0.7–4.0)
Neutrophils Relative %: 60 % (ref 43–77)

## 2010-12-10 LAB — CBC
Platelets: 239 10*3/uL (ref 150–400)
RBC: 4.7 MIL/uL (ref 3.87–5.11)
RDW: 15.4 % (ref 11.5–15.5)
WBC: 8.8 10*3/uL (ref 4.0–10.5)

## 2010-12-10 LAB — PRO B NATRIURETIC PEPTIDE: Pro B Natriuretic peptide (BNP): 20 pg/mL (ref 0–125)

## 2010-12-10 LAB — URINE MICROSCOPIC-ADD ON

## 2010-12-10 LAB — URINALYSIS, ROUTINE W REFLEX MICROSCOPIC
Bilirubin Urine: NEGATIVE
Hgb urine dipstick: NEGATIVE
Ketones, ur: NEGATIVE mg/dL
Nitrite: NEGATIVE
Specific Gravity, Urine: 1.035 — ABNORMAL HIGH (ref 1.005–1.030)
pH: 5 (ref 5.0–8.0)

## 2010-12-10 LAB — URINE CULTURE
Colony Count: 50000
Culture  Setup Time: 201209180153

## 2010-12-10 LAB — POCT I-STAT TROPONIN I: Troponin i, poc: 0 ng/mL (ref 0.00–0.08)

## 2010-12-13 LAB — DIFFERENTIAL
Basophils Absolute: 0.1
Basophils Relative: 1
Eosinophils Absolute: 0.2
Eosinophils Relative: 2
Lymphocytes Relative: 19
Lymphs Abs: 2
Monocytes Absolute: 0.4
Monocytes Relative: 4
Neutro Abs: 7.8 — ABNORMAL HIGH
Neutrophils Relative %: 74

## 2010-12-13 LAB — I-STAT 8, (EC8 V) (CONVERTED LAB)
Acid-Base Excess: 3 — ABNORMAL HIGH
Bicarbonate: 28.2 — ABNORMAL HIGH
Chloride: 106
HCT: 41
Operator id: 277751
TCO2: 29
pCO2, Ven: 42.6 — ABNORMAL LOW
pH, Ven: 7.428 — ABNORMAL HIGH

## 2010-12-13 LAB — CBC
HCT: 37.1
Hemoglobin: 12.3
MCHC: 33.1
MCV: 85.5
Platelets: 240
RBC: 4.34
RDW: 15.9 — ABNORMAL HIGH
WBC: 10.5

## 2010-12-14 IMAGING — CR DG CHEST 2V
2 series · 2 of 2 positions shown · non-contrast
Comparison: 04/16/2008

CLINICAL DATA: Short of breath

CHEST - 2 VIEW

[w chest pa]
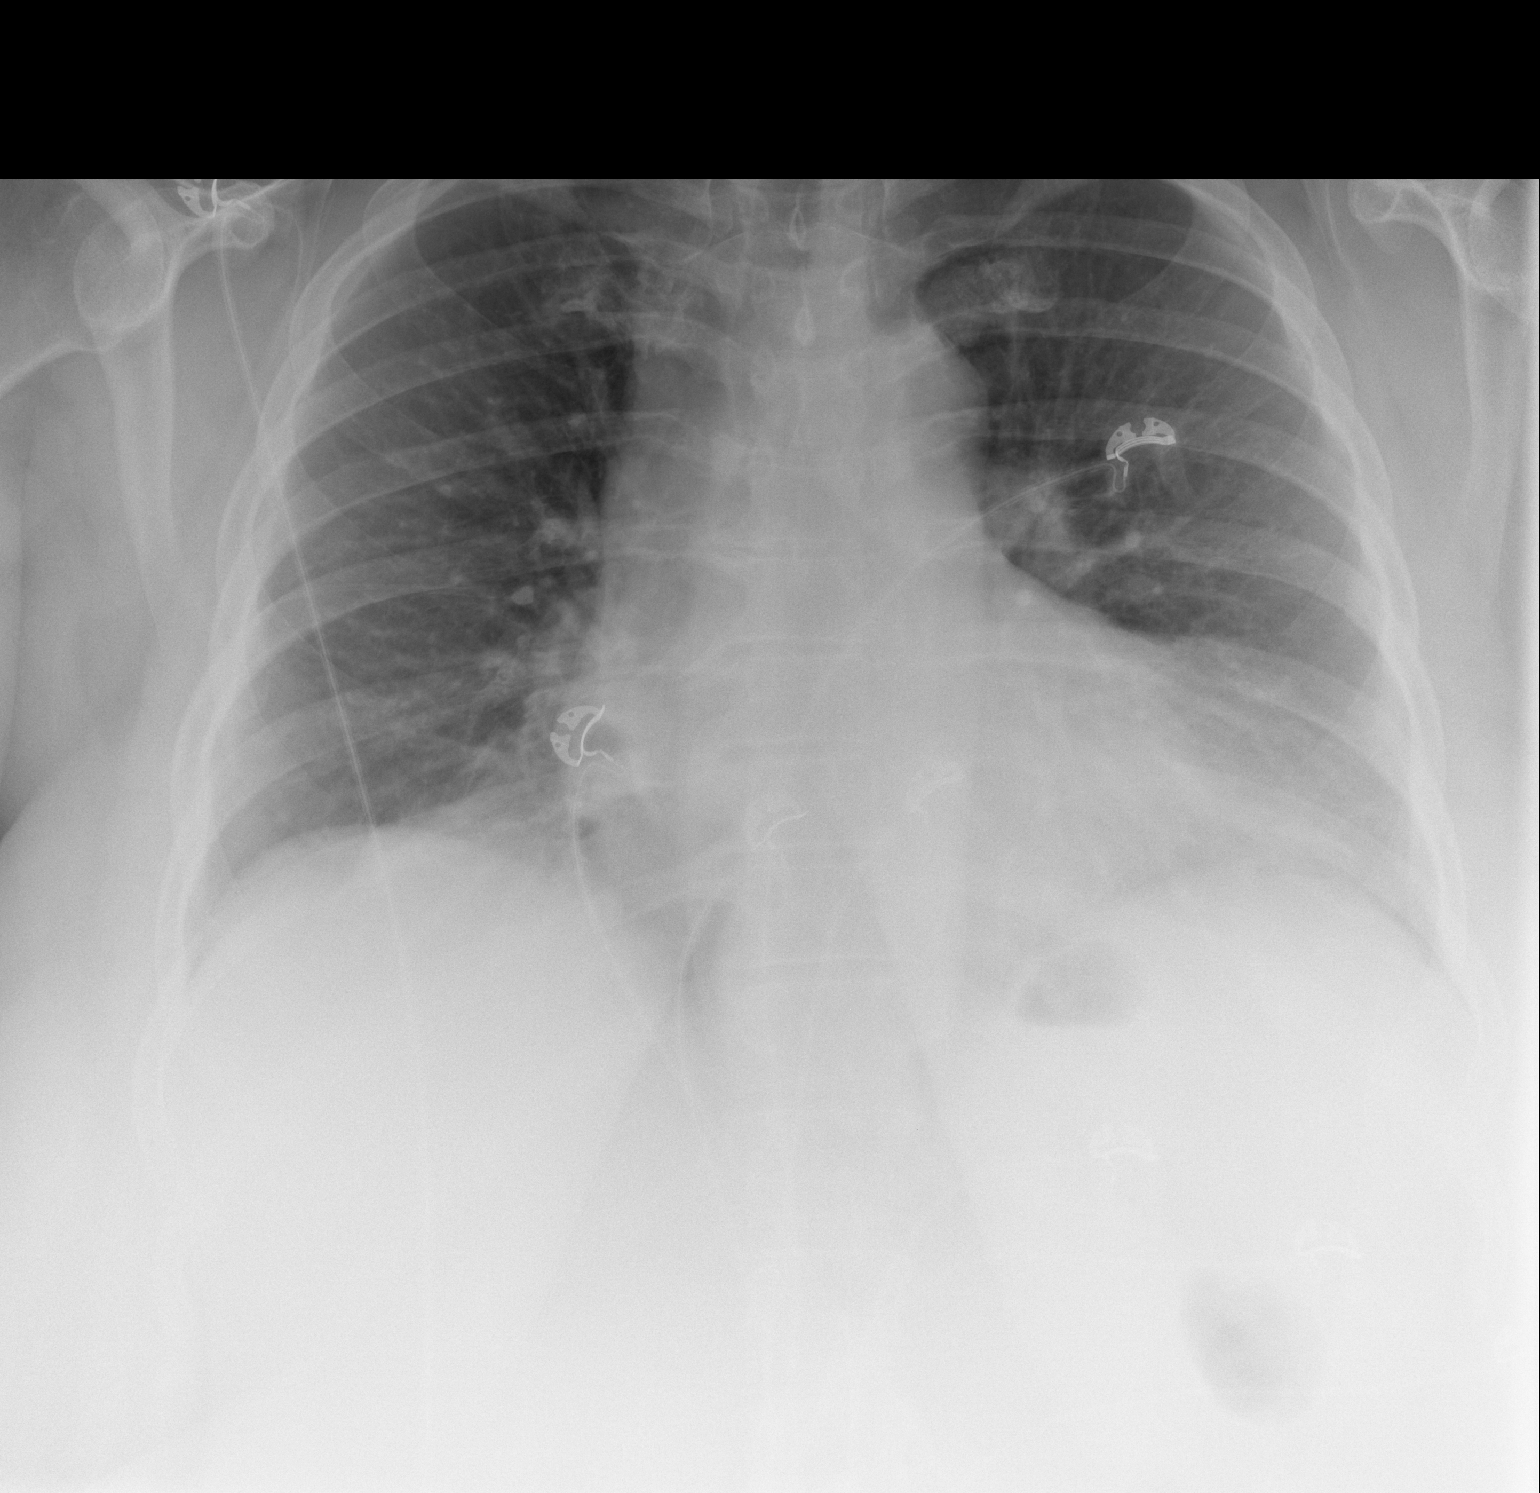

[w chest lat *]
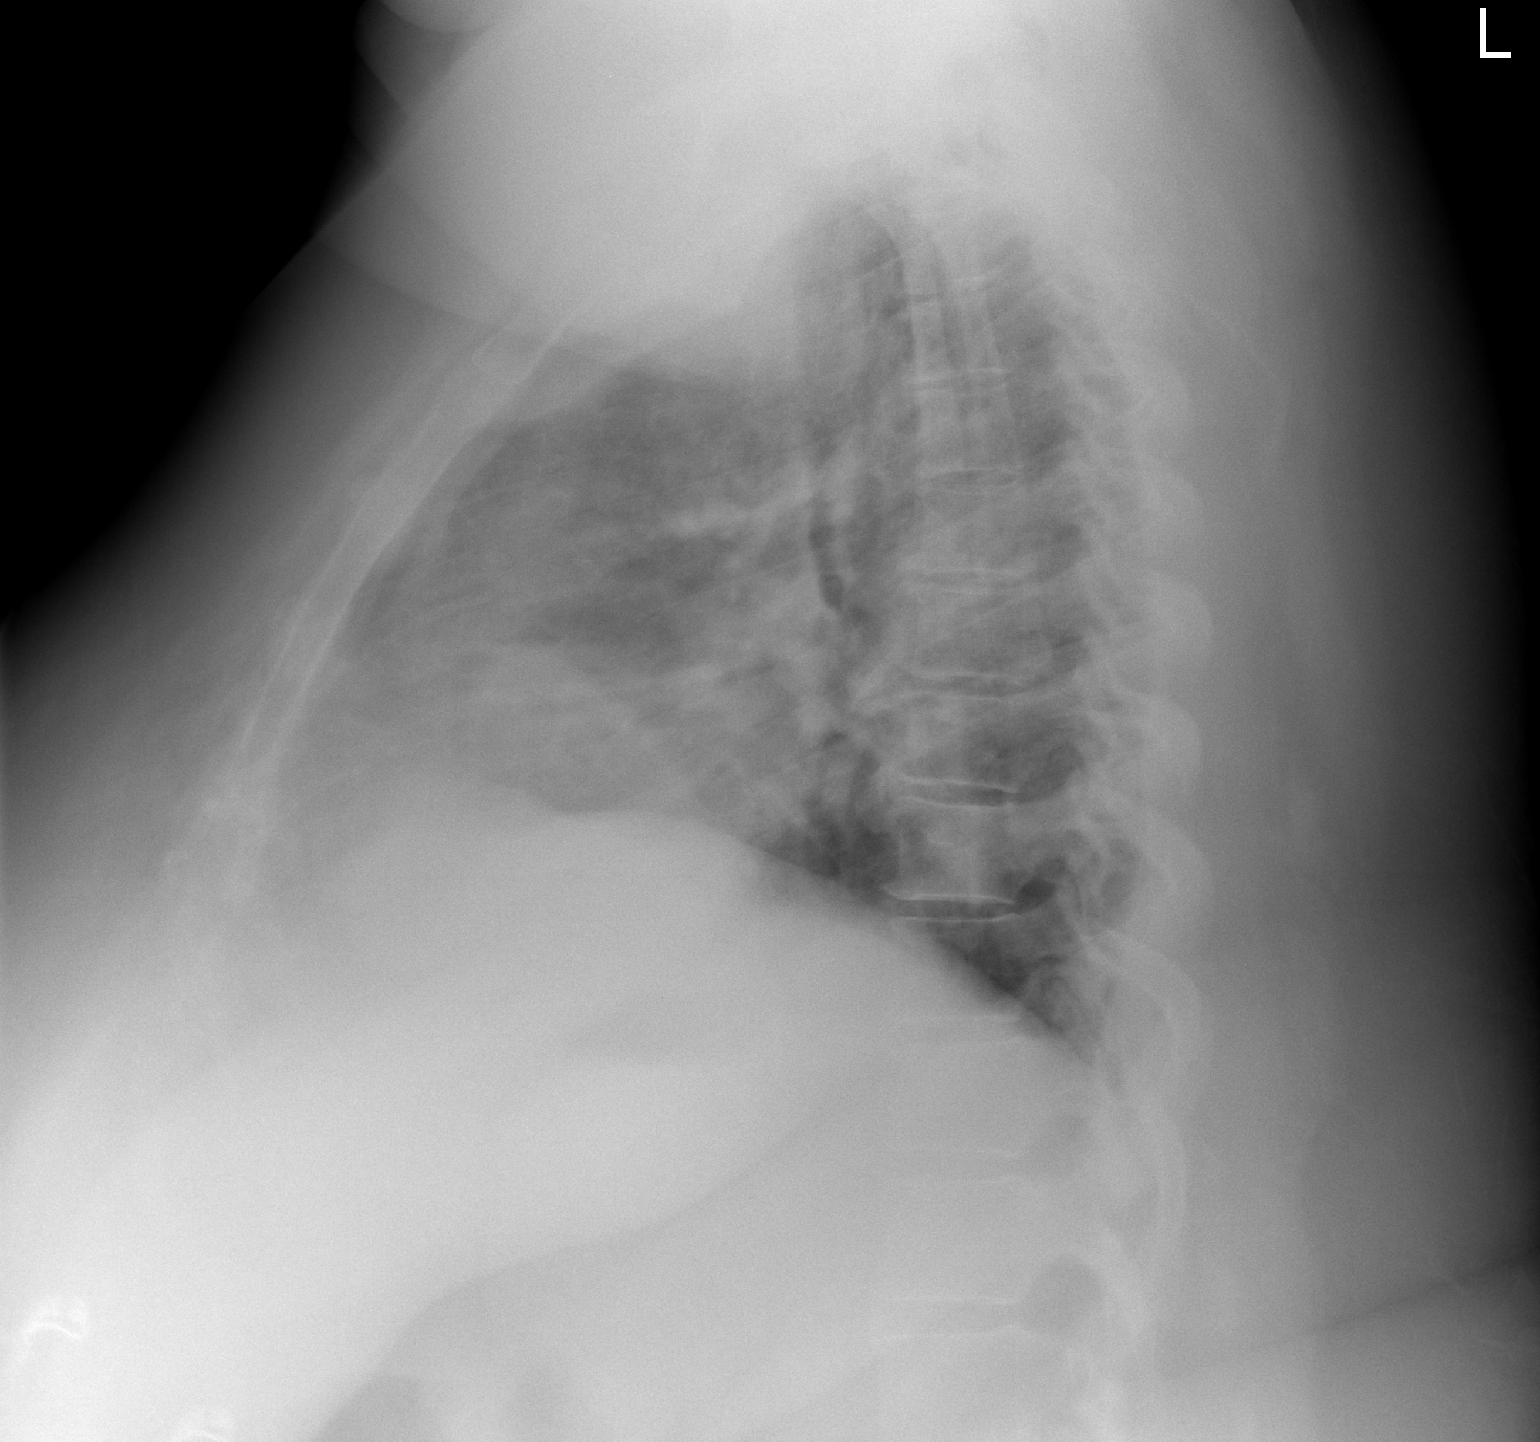

[2 of 2 positions shown; findings below may reference images not displayed]

FINDINGS: Cardiomegaly and mild to moderate pulmonary vascular
congestion without edema.  No infiltrate. No pleural effusions.
IMPRESSION: Cardiomegaly and vascular congestion.

## 2010-12-16 LAB — CBC
HCT: 37.2
Hemoglobin: 12.4
MCHC: 33.3
MCV: 84.3
RBC: 4.41
RDW: 15.8 — ABNORMAL HIGH

## 2010-12-16 LAB — COMPREHENSIVE METABOLIC PANEL
ALT: 14
BUN: 11
CO2: 31
Calcium: 8.4
GFR calc non Af Amer: >60
Glucose, Bld: 105 — ABNORMAL HIGH
Sodium: 139
Total Protein: 6.5

## 2010-12-16 LAB — DIFFERENTIAL
Basophils Relative: 1
Eosinophils Absolute: 0.2
Lymphs Abs: 1.9
Monocytes Relative: 5
Neutro Abs: 6.3
Neutrophils Relative %: 71

## 2010-12-17 LAB — D-DIMER, QUANTITATIVE: D-Dimer, Quant: 0.6 — ABNORMAL HIGH

## 2010-12-19 LAB — CBC
HCT: 36.3
HCT: 38.1
Hemoglobin: 12
Hemoglobin: 12
Hemoglobin: 12.6
MCHC: 33.1
MCHC: 33.1
MCHC: 34.7
Platelets: 247
RBC: 3.98
RBC: 4.49
RDW: 14.6
RDW: 14.8
RDW: 14.9
WBC: 10.2

## 2010-12-19 LAB — COMPREHENSIVE METABOLIC PANEL
ALT: 15
AST: 24
Albumin: 3.6
Chloride: 102
Creatinine, Ser: 0.99
GFR calc Af Amer: >60
Potassium: 4
Sodium: 136
Total Bilirubin: 0.7

## 2010-12-19 LAB — BASIC METABOLIC PANEL
CO2: 35 — ABNORMAL HIGH
Calcium: 8.7
GFR calc non Af Amer: >60
Glucose, Bld: 111 — ABNORMAL HIGH
Glucose, Bld: 158 — ABNORMAL HIGH
Potassium: 4.4
Sodium: 139
Sodium: 140

## 2010-12-19 LAB — DIFFERENTIAL
Basophils Absolute: 0
Basophils Absolute: 0
Basophils Relative: 0
Eosinophils Absolute: 0
Eosinophils Relative: 0
Lymphocytes Relative: 19
Lymphocytes Relative: 5 — ABNORMAL LOW
Monocytes Absolute: 0 — ABNORMAL LOW
Neutro Abs: 5.9
Neutrophils Relative %: 72

## 2010-12-19 LAB — B-NATRIURETIC PEPTIDE (CONVERTED LAB): Pro B Natriuretic peptide (BNP): 126 — ABNORMAL HIGH

## 2010-12-19 LAB — PROTIME-INR
INR: 1
Prothrombin Time: 13.2

## 2010-12-19 LAB — POCT PREGNANCY, URINE
Operator id: 151321
Preg Test, Ur: NEGATIVE

## 2010-12-19 LAB — URINALYSIS, ROUTINE W REFLEX MICROSCOPIC
Bilirubin Urine: NEGATIVE
Glucose, UA: NEGATIVE
Glucose, UA: NEGATIVE
Hgb urine dipstick: NEGATIVE
Hgb urine dipstick: NEGATIVE
Ketones, ur: NEGATIVE
Nitrite: NEGATIVE
Protein, ur: NEGATIVE
Specific Gravity, Urine: 1.019
Specific Gravity, Urine: 1.019
Urobilinogen, UA: 0.2
pH: 5

## 2010-12-19 LAB — LIPID PANEL
HDL: 46
Total CHOL/HDL Ratio: 3.7
Triglycerides: 52
VLDL: 10

## 2010-12-19 LAB — GC/CHLAMYDIA PROBE AMP, GENITAL: Chlamydia, DNA Probe: NEGATIVE

## 2010-12-19 LAB — RAPID STREP SCREEN (MED CTR MEBANE ONLY): Streptococcus, Group A Screen (Direct): NEGATIVE

## 2010-12-19 LAB — CARDIAC PANEL(CRET KIN+CKTOT+MB+TROPI)
CK, MB: 2
Relative Index: INVALID
Relative Index: INVALID
Total CK: 53
Troponin I: 0.01
Troponin I: 0.01

## 2010-12-19 LAB — TSH: TSH: 0.812

## 2010-12-19 LAB — WET PREP, GENITAL: Yeast Wet Prep HPF POC: NONE SEEN

## 2010-12-19 LAB — RPR: RPR Ser Ql: NONREACTIVE

## 2010-12-19 LAB — URINE CULTURE: Colony Count: 25000

## 2010-12-20 LAB — DIFFERENTIAL
Basophils Absolute: 0
Basophils Relative: 0
Eosinophils Absolute: 0.2
Eosinophils Absolute: 0.3
Eosinophils Relative: 3
Lymphs Abs: 1.6
Monocytes Absolute: 0.7
Monocytes Relative: 4
Neutrophils Relative %: 79 — ABNORMAL HIGH

## 2010-12-20 LAB — CBC
HCT: 42.1
Hemoglobin: 13.6
MCHC: 32.3
MCV: 86.5
MCV: 88.3
RBC: 4.23
RDW: 15.4
WBC: 10.3

## 2010-12-20 LAB — URINALYSIS, ROUTINE W REFLEX MICROSCOPIC
Bilirubin Urine: NEGATIVE
Glucose, UA: NEGATIVE
Hgb urine dipstick: NEGATIVE
Ketones, ur: NEGATIVE
Ketones, ur: 15 — AB
Nitrite: NEGATIVE
Urobilinogen, UA: 1
pH: 5.5

## 2010-12-20 LAB — URINE CULTURE: Colony Count: 75000

## 2010-12-20 LAB — POCT I-STAT, CHEM 8
BUN: 16
Calcium, Ion: 1.07 — ABNORMAL LOW
Chloride: 100
Creatinine, Ser: 1.2
Creatinine, Ser: 1.2
Glucose, Bld: 100 — ABNORMAL HIGH
Hemoglobin: 12.6
Hemoglobin: 15
Potassium: 3.6
Sodium: 141
TCO2: 34

## 2010-12-23 LAB — DIFFERENTIAL
Basophils Absolute: 0
Basophils Relative: 0
Lymphocytes Relative: 23
Monocytes Absolute: 0.4
Neutro Abs: 7
Neutrophils Relative %: 71

## 2010-12-23 LAB — CBC
Hemoglobin: 11.9 — ABNORMAL LOW
MCHC: 32.8
Platelets: 230
RDW: 14.7

## 2010-12-23 LAB — RPR: RPR Ser Ql: NONREACTIVE

## 2010-12-23 LAB — BASIC METABOLIC PANEL
CO2: 28
Calcium: 8.3 — ABNORMAL LOW
Creatinine, Ser: 1.04
GFR calc Af Amer: >60
GFR calc non Af Amer: 58 — ABNORMAL LOW
Glucose, Bld: 109 — ABNORMAL HIGH
Sodium: 141

## 2010-12-23 LAB — PREGNANCY, URINE: Preg Test, Ur: NEGATIVE

## 2010-12-23 LAB — RAPID URINE DRUG SCREEN, HOSP PERFORMED
Barbiturates: NOT DETECTED
Benzodiazepines: NOT DETECTED

## 2010-12-23 LAB — GLUCOSE, CAPILLARY: Glucose-Capillary: 139 — ABNORMAL HIGH

## 2010-12-24 LAB — BASIC METABOLIC PANEL
BUN: 19 mg/dL (ref 6–23)
CO2: 28 mEq/L (ref 19–32)
CO2: 30 mEq/L (ref 19–32)
Calcium: 8.8 mg/dL (ref 8.4–10.5)
Chloride: 100 mEq/L (ref 96–112)
Chloride: 106 mEq/L (ref 96–112)
Creatinine, Ser: 1 mg/dL (ref 0.4–1.2)
GFR calc Af Amer: >60 mL/min (ref >60)
GFR calc Af Amer: >60 mL/min (ref >60)
GFR calc non Af Amer: 49 mL/min — ABNORMAL LOW (ref >60)
GFR calc non Af Amer: >60 mL/min (ref >60)
Glucose, Bld: 118 mg/dL — ABNORMAL HIGH (ref 70–99)
Potassium: 4.4 mEq/L (ref 3.5–5.1)
Sodium: 142 mEq/L (ref 135–145)

## 2010-12-24 LAB — COMPREHENSIVE METABOLIC PANEL
ALT: 14 U/L (ref 0–35)
AST: 13 U/L (ref 0–37)
AST: 12
AST: 22
Albumin: 3.3 g/dL — ABNORMAL LOW (ref 3.5–5.2)
Albumin: 3.1 — ABNORMAL LOW
Alkaline Phosphatase: 54 U/L (ref 39–117)
Alkaline Phosphatase: 58
BUN: 14 mg/dL (ref 6–23)
BUN: 17
BUN: 19
CO2: 29
Chloride: 102 mEq/L (ref 96–112)
Chloride: 104
Creatinine, Ser: 1.01
Creatinine, Ser: 1.05
GFR calc Af Amer: >60
GFR calc non Af Amer: 60 — ABNORMAL LOW
Potassium: 4.5 mEq/L (ref 3.5–5.1)
Sodium: 139 mEq/L (ref 135–145)
Total Bilirubin: 0.4 mg/dL (ref 0.3–1.2)
Total Bilirubin: 0.9
Total Protein: 6.7 g/dL (ref 6.0–8.3)
Total Protein: 6.2

## 2010-12-24 LAB — URINALYSIS, ROUTINE W REFLEX MICROSCOPIC
Bilirubin Urine: NEGATIVE
Glucose, UA: NEGATIVE mg/dL
Hgb urine dipstick: NEGATIVE
Ketones, ur: NEGATIVE mg/dL
Ketones, ur: NEGATIVE
Nitrite: NEGATIVE
Nitrite: NEGATIVE
Nitrite: NEGATIVE
Protein, ur: NEGATIVE mg/dL
Protein, ur: NEGATIVE
Specific Gravity, Urine: 1.023
Urobilinogen, UA: 0.2
Urobilinogen, UA: 0.2
pH: 6 (ref 5.0–8.0)
pH: 5

## 2010-12-24 LAB — DIFFERENTIAL
Basophils Absolute: 0 10*3/uL (ref 0.0–0.1)
Basophils Absolute: 0
Basophils Relative: 0 % (ref 0–1)
Basophils Relative: 0 % (ref 0–1)
Basophils Relative: 0
Eosinophils Absolute: 0 10*3/uL (ref 0.0–0.7)
Eosinophils Absolute: 0.4 10*3/uL (ref 0.0–0.7)
Eosinophils Relative: 0 % (ref 0–5)
Eosinophils Relative: 3 % (ref 0–5)
Eosinophils Relative: 2
Lymphocytes Relative: 24
Lymphocytes Relative: 25
Lymphs Abs: 1.8
Monocytes Absolute: 0.2 10*3/uL (ref 0.1–1.0)
Monocytes Absolute: 0.5 10*3/uL (ref 0.1–1.0)
Monocytes Absolute: 0.4
Monocytes Relative: 2 % — ABNORMAL LOW (ref 3–12)
Monocytes Relative: 5 % (ref 3–12)
Monocytes Relative: 5
Neutro Abs: 4.9
Neutro Abs: 6.4
Neutrophils Relative %: 72 % (ref 43–77)

## 2010-12-24 LAB — CBC
HCT: 37.4 % (ref 36.0–46.0)
HCT: 38.2 % (ref 36.0–46.0)
HCT: 36.8
HCT: 39.7
Hemoglobin: 13.1 g/dL (ref 12.0–15.0)
MCHC: 32.5 g/dL (ref 30.0–36.0)
MCHC: 32.6 g/dL (ref 30.0–36.0)
MCV: 87.9 fL (ref 78.0–100.0)
MCV: 86.6
MCV: 87.4
Platelets: 254 10*3/uL (ref 150–400)
Platelets: 296 10*3/uL (ref 150–400)
Platelets: 214
RBC: 4.35 MIL/uL (ref 3.87–5.11)
RBC: 4.54
RDW: 15 % (ref 11.5–15.5)
RDW: 15.3 % (ref 11.5–15.5)
RDW: 14.6
WBC: 14.4 10*3/uL — ABNORMAL HIGH (ref 4.0–10.5)
WBC: 9.3

## 2010-12-24 LAB — POCT CARDIAC MARKERS
CKMB, poc: <1 ng/mL — ABNORMAL LOW (ref 1.0–8.0)
CKMB, poc: 1
CKMB, poc: 1
Myoglobin, poc: 68.8 ng/mL (ref 12–200)
Myoglobin, poc: 60.4
Troponin i, poc: <0.05 ng/mL (ref 0.00–0.09)
Troponin i, poc: <0.05
Troponin i, poc: <0.05

## 2010-12-24 LAB — URINE CULTURE: Colony Count: >=100000

## 2010-12-24 LAB — RAPID URINE DRUG SCREEN, HOSP PERFORMED
Amphetamines: NOT DETECTED
Barbiturates: NOT DETECTED
Benzodiazepines: NOT DETECTED
Opiates: NOT DETECTED

## 2010-12-24 LAB — POCT I-STAT, CHEM 8
BUN: 21
Chloride: 103
Creatinine, Ser: 1.2
Glucose, Bld: 96
Potassium: 4.4
Sodium: 142

## 2010-12-24 LAB — URINE MICROSCOPIC-ADD ON

## 2010-12-24 LAB — CULTURE, BLOOD (ROUTINE X 2): Culture: NO GROWTH

## 2010-12-24 LAB — VITAMIN B12: Vitamin B-12: 348 (ref 211–911)

## 2010-12-24 LAB — POCT PREGNANCY, URINE: Preg Test, Ur: NEGATIVE

## 2010-12-26 LAB — DIFFERENTIAL
Basophils Relative: 1 % (ref 0–1)
Eosinophils Absolute: 0.2 10*3/uL (ref 0.0–0.7)
Lymphs Abs: 1.5 10*3/uL (ref 0.7–4.0)
Monocytes Relative: 5 % (ref 3–12)
Neutro Abs: 5.7 10*3/uL (ref 1.7–7.7)
Neutrophils Relative %: 73 % (ref 43–77)

## 2010-12-26 LAB — CBC
MCV: 86.9 fL (ref 78.0–100.0)
Platelets: 235 10*3/uL (ref 150–400)
RBC: 4.13 MIL/uL (ref 3.87–5.11)
WBC: 7.8 10*3/uL (ref 4.0–10.5)

## 2010-12-26 LAB — BASIC METABOLIC PANEL
BUN: 15 mg/dL (ref 6–23)
Calcium: 8.2 mg/dL — ABNORMAL LOW (ref 8.4–10.5)
Creatinine, Ser: 0.97 mg/dL (ref 0.4–1.2)
GFR calc Af Amer: >60 mL/min (ref >60)

## 2010-12-26 LAB — B-NATRIURETIC PEPTIDE (CONVERTED LAB): Pro B Natriuretic peptide (BNP): 34.9 pg/mL (ref 0.0–100.0)

## 2010-12-26 IMAGING — CR DG CHEST 2V
2 series · 2 of 2 positions shown · non-contrast
Comparison: 05/27/2008

CLINICAL DATA: Headache, shortness of breath, hypertension, pain,
swelling

CHEST - 2 VIEW

[w chest pa]
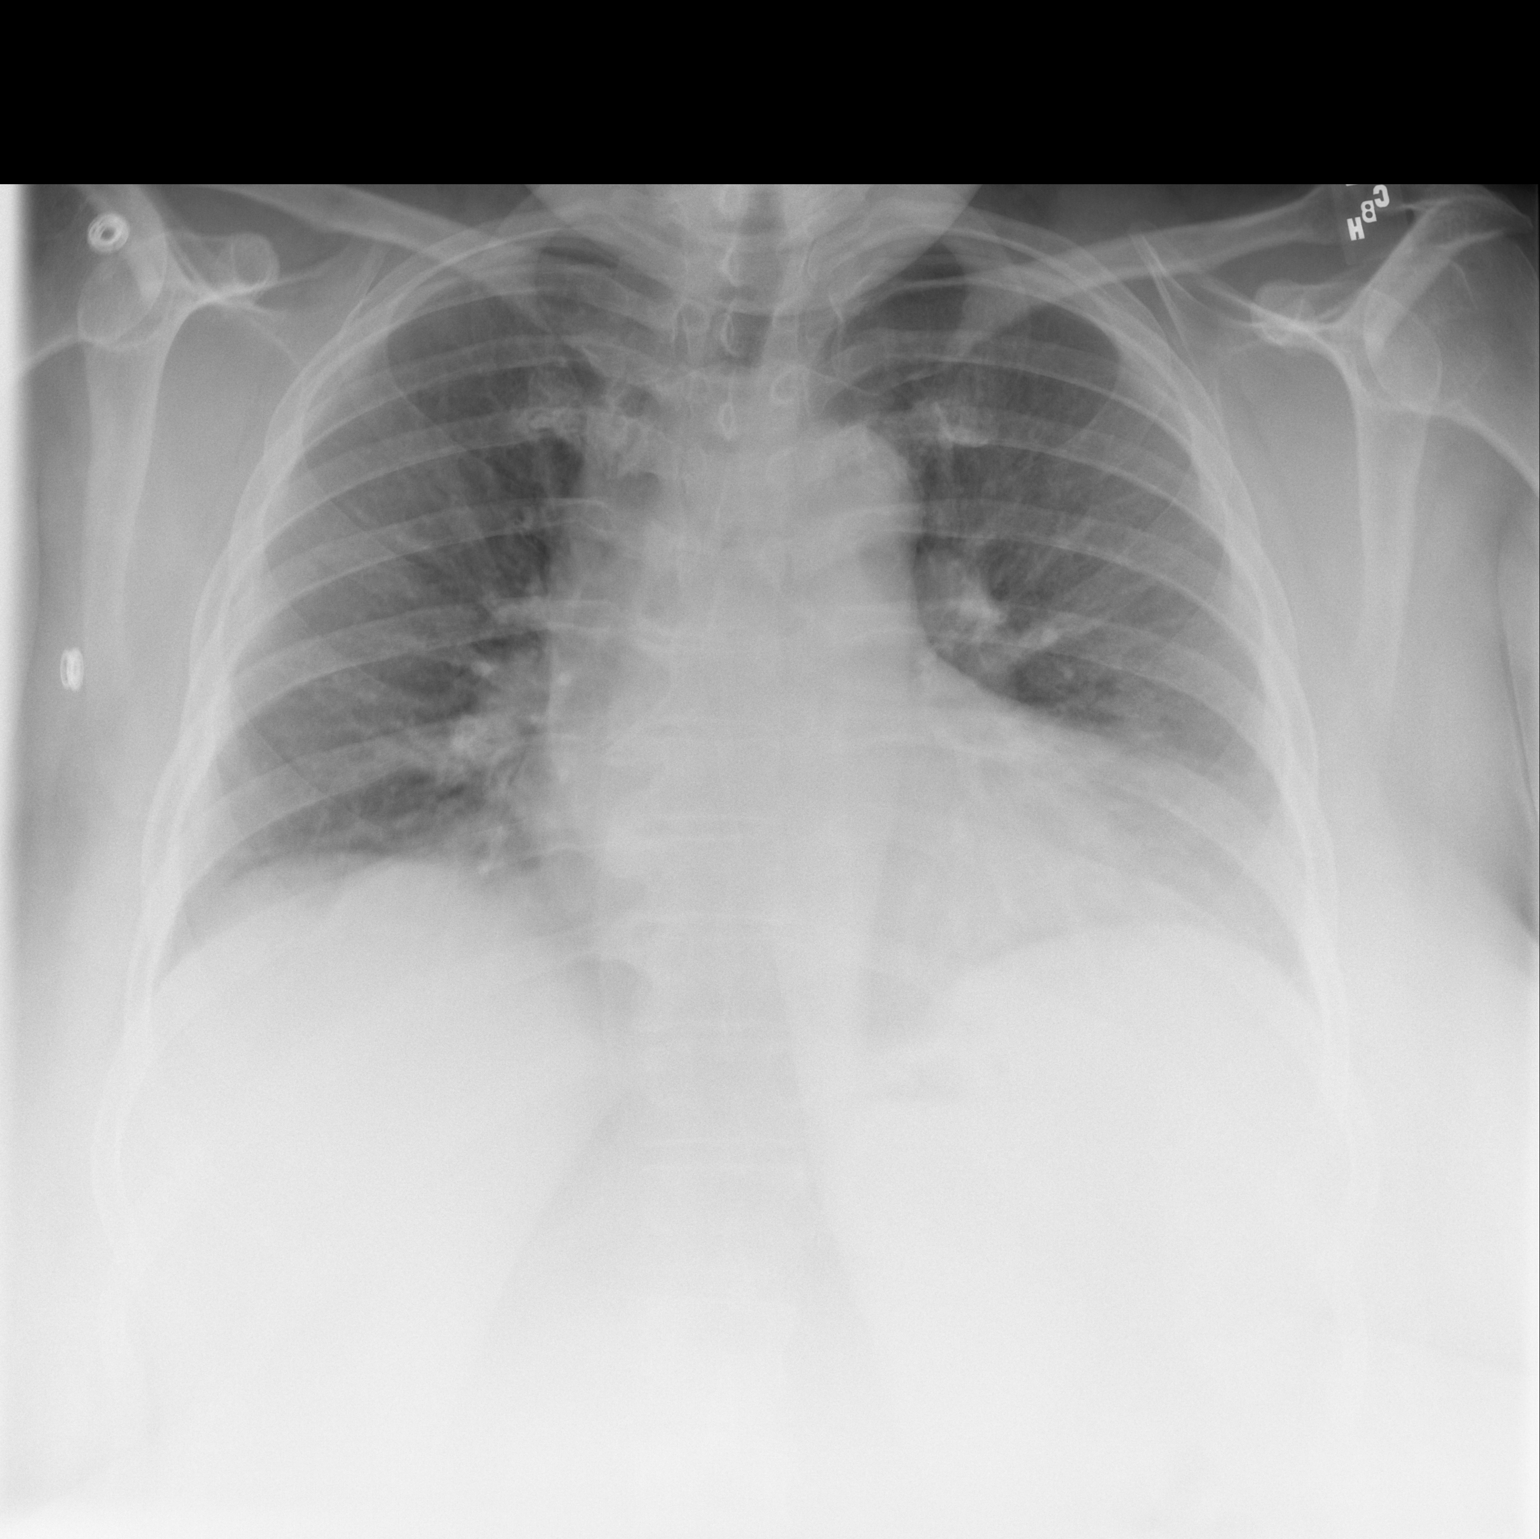

[w chest lat]
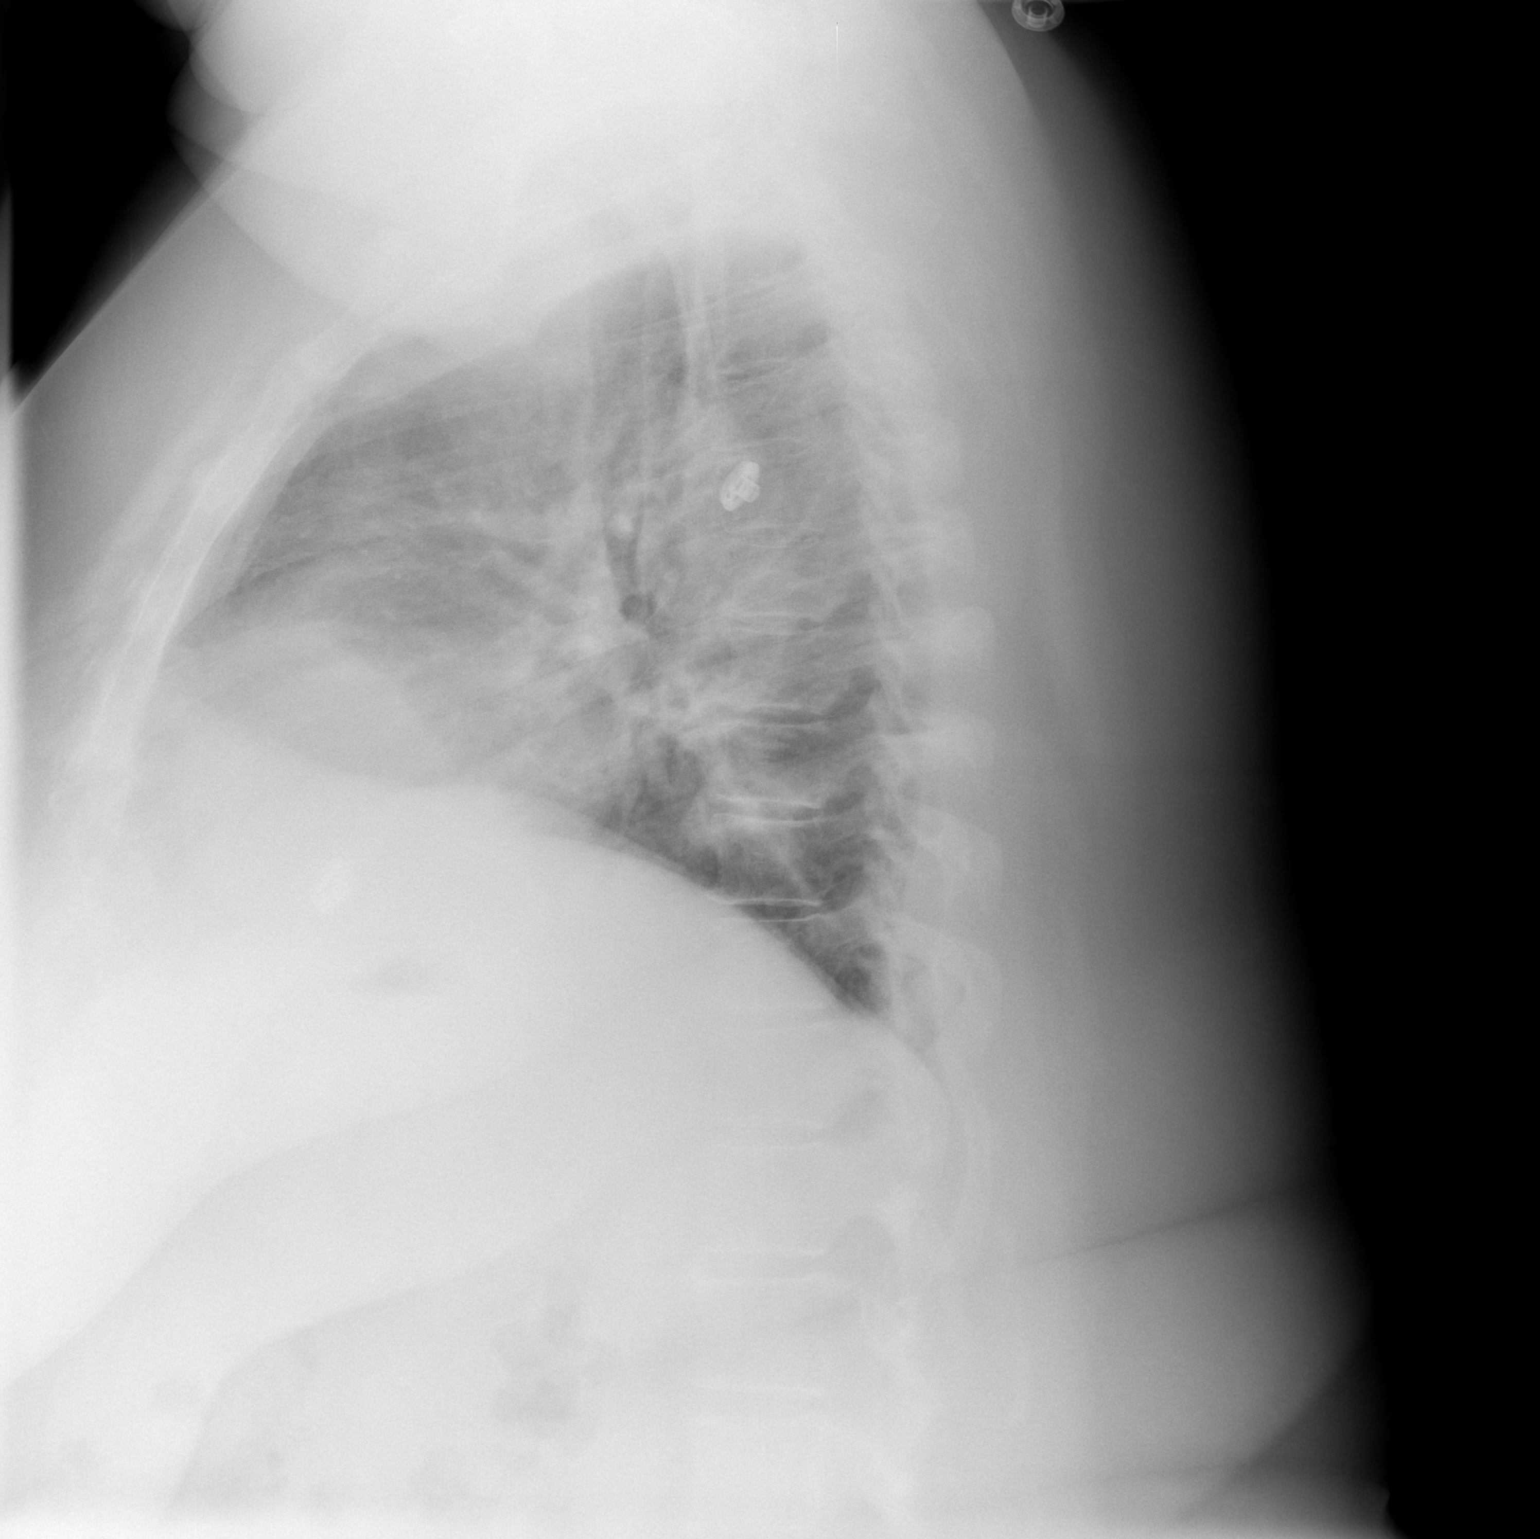

[2 of 2 positions shown; findings below may reference images not displayed]

FINDINGS: Cardiac enlargement with slight pulmonary vascular congestion.
Slightly decreased lung volumes with minimal right basilar
atelectasis.
No gross failure consolidation.
Bones unremarkable.
No pleural effusion or pneumothorax.
IMPRESSION: Cardiomegaly with minimal pulmonary vascular congestion.
Basilar hypoinflation.

## 2010-12-27 LAB — CBC
HCT: 37.5 % (ref 36.0–46.0)
MCV: 87.3 fL (ref 78.0–100.0)
Platelets: 229 10*3/uL (ref 150–400)
RBC: 4.29 MIL/uL (ref 3.87–5.11)
WBC: 12.7 10*3/uL — ABNORMAL HIGH (ref 4.0–10.5)

## 2010-12-27 LAB — POCT I-STAT 3, ART BLOOD GAS (G3+)
Acid-Base Excess: 6 mmol/L — ABNORMAL HIGH (ref 0.0–2.0)
Bicarbonate: 31.7 mEq/L — ABNORMAL HIGH (ref 20.0–24.0)
Patient temperature: 98.6
pH, Arterial: 7.396 (ref 7.350–7.400)

## 2010-12-27 LAB — DIFFERENTIAL
Basophils Absolute: 0.1 10*3/uL (ref 0.0–0.1)
Basophils Relative: 1 % (ref 0–1)
Eosinophils Absolute: 0.1 10*3/uL (ref 0.0–0.7)
Eosinophils Relative: 1 % (ref 0–5)
Monocytes Absolute: 0.7 10*3/uL (ref 0.1–1.0)

## 2010-12-27 LAB — CARDIAC PANEL(CRET KIN+CKTOT+MB+TROPI)
Relative Index: INVALID (ref 0.0–2.5)
Total CK: 47 U/L (ref 7–177)
Troponin I: <0.01 ng/mL (ref 0.00–0.06)

## 2010-12-27 LAB — URINALYSIS, ROUTINE W REFLEX MICROSCOPIC
Bilirubin Urine: NEGATIVE
Hgb urine dipstick: NEGATIVE
Ketones, ur: NEGATIVE mg/dL
Specific Gravity, Urine: 1.029 (ref 1.005–1.030)
Urobilinogen, UA: 1 mg/dL (ref 0.0–1.0)

## 2010-12-27 LAB — COMPREHENSIVE METABOLIC PANEL
AST: 18 U/L (ref 0–37)
Albumin: 3.2 g/dL — ABNORMAL LOW (ref 3.5–5.2)
Alkaline Phosphatase: 53 U/L (ref 39–117)
CO2: 33 mEq/L — ABNORMAL HIGH (ref 19–32)
Chloride: 102 mEq/L (ref 96–112)
Creatinine, Ser: 1.07 mg/dL (ref 0.4–1.2)
GFR calc Af Amer: >60 mL/min (ref >60)
GFR calc non Af Amer: 56 mL/min — ABNORMAL LOW (ref >60)
Potassium: 4 mEq/L (ref 3.5–5.1)
Total Bilirubin: 0.7 mg/dL (ref 0.3–1.2)

## 2010-12-27 LAB — URINE MICROSCOPIC-ADD ON

## 2010-12-27 LAB — POCT PREGNANCY, URINE: Preg Test, Ur: NEGATIVE

## 2010-12-31 LAB — CBC
HCT: 34.2 — ABNORMAL LOW
MCHC: 33.5
MCV: 84.4
MCV: 85.3
Platelets: 275
Platelets: 350
RBC: 4.05
WBC: 10.6 — ABNORMAL HIGH
WBC: 18.2 — ABNORMAL HIGH
WBC: 8.2

## 2010-12-31 LAB — B-NATRIURETIC PEPTIDE (CONVERTED LAB): Pro B Natriuretic peptide (BNP): <30

## 2010-12-31 LAB — URINALYSIS, ROUTINE W REFLEX MICROSCOPIC
Glucose, UA: NEGATIVE
Hgb urine dipstick: NEGATIVE
Ketones, ur: NEGATIVE
pH: 5.5

## 2010-12-31 LAB — BASIC METABOLIC PANEL
BUN: 19
BUN: 19
CO2: 29
Calcium: 8.5
Chloride: 104
Chloride: 99
Creatinine, Ser: 0.99
Creatinine, Ser: 1.02
GFR calc Af Amer: >60
GFR calc non Af Amer: 59 — ABNORMAL LOW
Potassium: 4.3
Potassium: 4.4
Sodium: 140

## 2010-12-31 LAB — RAPID URINE DRUG SCREEN, HOSP PERFORMED
Opiates: NOT DETECTED
Tetrahydrocannabinol: NOT DETECTED

## 2010-12-31 LAB — DIFFERENTIAL
Basophils Relative: 0
Eosinophils Absolute: 0.3
Eosinophils Absolute: 0.3
Eosinophils Relative: 3
Lymphs Abs: 1.7
Monocytes Relative: 6
Neutrophils Relative %: 69
Neutrophils Relative %: 72

## 2010-12-31 LAB — CARDIAC PANEL(CRET KIN+CKTOT+MB+TROPI)
CK, MB: 1.2
Total CK: 89
Troponin I: 0.04

## 2010-12-31 LAB — URINE CULTURE: Colony Count: 30000

## 2011-01-03 LAB — URINALYSIS, ROUTINE W REFLEX MICROSCOPIC
Bilirubin Urine: NEGATIVE
Glucose, UA: NEGATIVE
Hgb urine dipstick: NEGATIVE
Ketones, ur: 15 — AB
Nitrite: NEGATIVE
Nitrite: NEGATIVE
Specific Gravity, Urine: 1.034 — ABNORMAL HIGH
Specific Gravity, Urine: 1.037 — ABNORMAL HIGH
Urobilinogen, UA: 1
pH: 5
pH: 5.5

## 2011-01-06 LAB — URINALYSIS, ROUTINE W REFLEX MICROSCOPIC
Ketones, ur: NEGATIVE
Ketones, ur: NEGATIVE
Nitrite: NEGATIVE
Nitrite: NEGATIVE
Protein, ur: NEGATIVE
Protein, ur: NEGATIVE
Urobilinogen, UA: 1

## 2011-01-08 LAB — COMPREHENSIVE METABOLIC PANEL
Albumin: 3.1 — ABNORMAL LOW
Alkaline Phosphatase: 47
BUN: 12
Chloride: 102
Creatinine, Ser: 0.86
Glucose, Bld: 99
Potassium: 4.1
Total Bilirubin: 0.5

## 2011-01-08 LAB — DIFFERENTIAL
Basophils Absolute: 0
Basophils Relative: 0
Lymphocytes Relative: 20
Monocytes Absolute: 0.3
Neutro Abs: 4.7
Neutrophils Relative %: 73

## 2011-01-08 LAB — POCT CARDIAC MARKERS
CKMB, poc: <1 — ABNORMAL LOW
Myoglobin, poc: 56.7
Operator id: 284141
Troponin i, poc: <0.05

## 2011-01-08 LAB — CBC
HCT: 36.4
Hemoglobin: 12
MCV: 86.8
Platelets: 220
RDW: 14

## 2011-01-08 LAB — D-DIMER, QUANTITATIVE: D-Dimer, Quant: 0.71 — ABNORMAL HIGH

## 2011-01-13 ENCOUNTER — Observation Stay (HOSPITAL_COMMUNITY)
Admission: EM | Admit: 2011-01-13 | Discharge: 2011-01-17 | Disposition: A | Discharge status: Home / Self Care | Payer: Medicaid Other | Attending: Internal Medicine | Admitting: Internal Medicine

## 2011-01-13 ENCOUNTER — Emergency Department (HOSPITAL_COMMUNITY): Payer: Medicaid Other

## 2011-01-13 DIAGNOSIS — M549 Dorsalgia, unspecified: Secondary | ICD-10-CM | POA: Insufficient documentation

## 2011-01-13 DIAGNOSIS — J45901 Unspecified asthma with (acute) exacerbation: Principal | ICD-10-CM | POA: Insufficient documentation

## 2011-01-13 DIAGNOSIS — I10 Essential (primary) hypertension: Secondary | ICD-10-CM | POA: Insufficient documentation

## 2011-01-13 DIAGNOSIS — N764 Abscess of vulva: Secondary | ICD-10-CM | POA: Insufficient documentation

## 2011-01-13 DIAGNOSIS — N3941 Urge incontinence: Secondary | ICD-10-CM | POA: Insufficient documentation

## 2011-01-13 DIAGNOSIS — G4733 Obstructive sleep apnea (adult) (pediatric): Secondary | ICD-10-CM | POA: Insufficient documentation

## 2011-01-13 DIAGNOSIS — R0602 Shortness of breath: Secondary | ICD-10-CM | POA: Insufficient documentation

## 2011-01-13 DIAGNOSIS — F411 Generalized anxiety disorder: Secondary | ICD-10-CM | POA: Insufficient documentation

## 2011-01-13 DIAGNOSIS — I517 Cardiomegaly: Secondary | ICD-10-CM | POA: Insufficient documentation

## 2011-01-13 DIAGNOSIS — Z79899 Other long term (current) drug therapy: Secondary | ICD-10-CM | POA: Insufficient documentation

## 2011-01-14 LAB — CARDIAC PANEL(CRET KIN+CKTOT+MB+TROPI)
CK, MB: 1.4 ng/mL (ref 0.3–4.0)
CK, MB: 2 ng/mL (ref 0.3–4.0)
Relative Index: INVALID (ref 0.0–2.5)
Total CK: 68 U/L (ref 7–177)
Troponin I: <0.3 ng/mL (ref <0.30)
Troponin I: <0.3 ng/mL (ref <0.30)

## 2011-01-14 LAB — CBC
Hemoglobin: 12.8 g/dL (ref 12.0–15.0)
MCH: 28.4 pg (ref 26.0–34.0)
MCH: 28.2 pg (ref 26.0–34.0)
MCHC: 30.9 g/dL (ref 30.0–36.0)
MCHC: 31.2 g/dL (ref 30.0–36.0)
Platelets: 226 10*3/uL (ref 150–400)
RBC: 4.5 MIL/uL (ref 3.87–5.11)

## 2011-01-14 LAB — BASIC METABOLIC PANEL
BUN: 19 mg/dL (ref 6–23)
Calcium: 9.1 mg/dL (ref 8.4–10.5)
Calcium: 9.1 mg/dL (ref 8.4–10.5)
GFR calc non Af Amer: 69 mL/min — ABNORMAL LOW (ref >90)
GFR calc non Af Amer: 59 mL/min — ABNORMAL LOW (ref >90)
Glucose, Bld: 94 mg/dL (ref 70–99)
Sodium: 138 mEq/L (ref 135–145)
Sodium: 141 mEq/L (ref 135–145)

## 2011-01-14 LAB — DIFFERENTIAL
Basophils Relative: 0 % (ref 0–1)
Eosinophils Relative: 2 % (ref 0–5)
Monocytes Absolute: 0.6 10*3/uL (ref 0.1–1.0)
Monocytes Relative: 5 % (ref 3–12)
Neutro Abs: 7.9 10*3/uL — ABNORMAL HIGH (ref 1.7–7.7)

## 2011-01-14 NOTE — H&P (Signed)
NAMEMarland Kitchen  Kara Vincent, Kara Vincent             ACCOUNT NO.:  [id number]  MEDICAL RECORD NO.:  [id number]  LOCATION:  WLED                         FACILITY:  [facility]  PHYSICIAN:  Quintella Baton, MD      DATE OF BIRTH:  12-10-1965  DATE OF ADMISSION:  01/13/2011 DATE OF DISCHARGE:                             HISTORY & PHYSICAL   PRIMARY CARE PHYSICIAN:  Dr. [name].  CODE STATUS:  Full code.  The patient goes to team III.  CHIEF COMPLAINT:  Shortness of breath, chest pain.  HISTORY OR PRESENT ILLNESS:  This is a 45 year old female who states that she started developing shortness of breath 2 to 3 days ago.  She does have chronic shortness of breath;  however, it has been getting worse.  She additionally developed some right-sided chest pains that she states she feels as though her lungs are swimming in water.  She states she has wheezing, her wheezing is chronic.  She does report a cough that is productive of mildly yellowish sputum.  The patient states that her chest pain is reproducible with movement.  She does report some mild orthopnea, no palpitation.  She has never had a stress test.  No cardiac history.  No history of CHF.  She additionally complains of headache.  She has additional complaints of her entire body going numb.  She complains of some constipation.  No altered mental status.  No burning urination.  No abdominal pain, fevers, chills, nausea, vomiting, or diarrhea.  The ER physician was concerned because the patient has comorbid issues and chest pain.  He has requested a hospitalist service to admit patient for rule out.  REVIEW OF SYSTEMS:  All 10-point systems reviewed and negative except as noted in HPI.  PAST MEDICAL HISTORY:  Includes: 1. Asthma. 2. Hypertension. 3. Anxiety. 4. Arthritis. 5. Migraine headaches. 6. Morbid obesity. 7. Obstructive sleep apnea. 8. Urge incontinence. 9. Darier disease.  PAST SURGICAL HISTORY:  C-sections, tubal  ligation.  MEDICATIONS:  Patient did not bring her medications, but she will go over it.  She is attempting go over the pharmacy tech and they do include Xanax, Percocet, lisinopril, Clonidine, and Protonix.  ALLERGIES:  Patient is allergic to: 1. BACTRIM. 2. IBUPROFEN. 3. SULFA. [place].  SOCIAL HISTORY:  Negative tobacco, alcohol, or illicit drugs.  Patient lives alone.  She is wheelchair bound.  She does do some minimal ambulation with a cane.  She is not on home oxygen.  She has nebulizer at home, and she has a CPAP machine.  FAMILY HISTORY:  Significant for diabetes mellitus, hypertension, and stroke.  PHYSICAL EXAMINATION:  VITAL SIGNS:  Blood pressure 141/93, pulse 87, respirations 20, temperature 99.1 satting 96% on room air. GENERAL:  Alert and oriented female,  currently in no acute distress. EYES:  Pink conjunctiva, PERRLA. ENT:  Moist oral mucosa.  Trachea midline. NECK:  Supple. LUNGS:  Poor exam secondary to patient's body habitus, but no wheeze appreciated.  No use of accessory muscles. CARDIOVASCULAR:  Regular rate and rhythm without murmurs, rigors, or gallops.  No significant JVD. ABDOMEN:  Morbidly obese, soft, positive bowel sounds.  Nontender. Unable to assess organomegaly secondary to patient's body habitus.  NEUROLOGIC:  Cranial II through XII grossly intact. MUSCULOSKELETAL:  Patient's strength is essentially 5/5 in all extremities.  Some edema, nonpitting. SKIN:  Patient has no sacral decubitus.  She does have some escorations in the low back.  No subcutaneous crepitation.  LABORATORY DATA:  Chest x-ray shows vascular congestion and mild cardiomegaly without significant pulmonary edema.  Sodium 138, potassium 4.3, chloride 100, CO2 of 30, glucose 94, BUN 19, creatinine 0.97. White blood count 11.4 hemoglobin 12.3, platelets 238.  Troponin 0.00. EKG, normal sinus rhythm.  ASSESSMENT AND PLAN: 1. Chest pains - right sided. 2. Dyspnea. 3.  Morbid obesity. 4. Obstructive sleep apnea.  The patient will be admitted for rule     out.  We will cycle cardiac enzymes.  The patient does have some     vascular congestion, but no overt pulmonary evidence of CHF.  I     would give the patient a single dose of Lasix and give the patient     aspirin, nitro p.r.n. left-sided chest pains.  We will continue     patient's nocturnal CPAP.  We will order DuoNebs q.4 hours p.r.n.     We will order a BNP.  If BNP comes back elevated, then defer to     a.m. team reordering of stress test.  Lovenox DVT prophylaxis will     be ordered. Hopeful discharge in AM. 5. Morbid obesity.  BMI was greater than 40. 6. Hypertension. 7. Urine incontinence.  Continue patient's home medications.          ______________________________ Quintella Baton, MD     DC/MEDQ  D:  01/14/2011  T:  01/14/2011  Job:  [id number]  Electronically Signed by Quintella Baton MD on 01/14/2011 06:37:21 AM

## 2011-01-15 ENCOUNTER — Observation Stay (HOSPITAL_COMMUNITY): Payer: Medicaid Other

## 2011-01-15 MED ORDER — IOHEXOL 300 MG/ML  SOLN
100.0000 mL | Freq: Once | INTRAMUSCULAR | Status: AC | PRN
  Administered 2011-01-15: 100 mL via INTRAVENOUS

## 2011-01-24 IMAGING — CR DG CHEST 2V
2 series · 2 of 2 positions shown · non-contrast
Comparison: 06/08/2008

CLINICAL DATA: Shortness of breath.

CHEST - 2 VIEW

[w chest pa *]
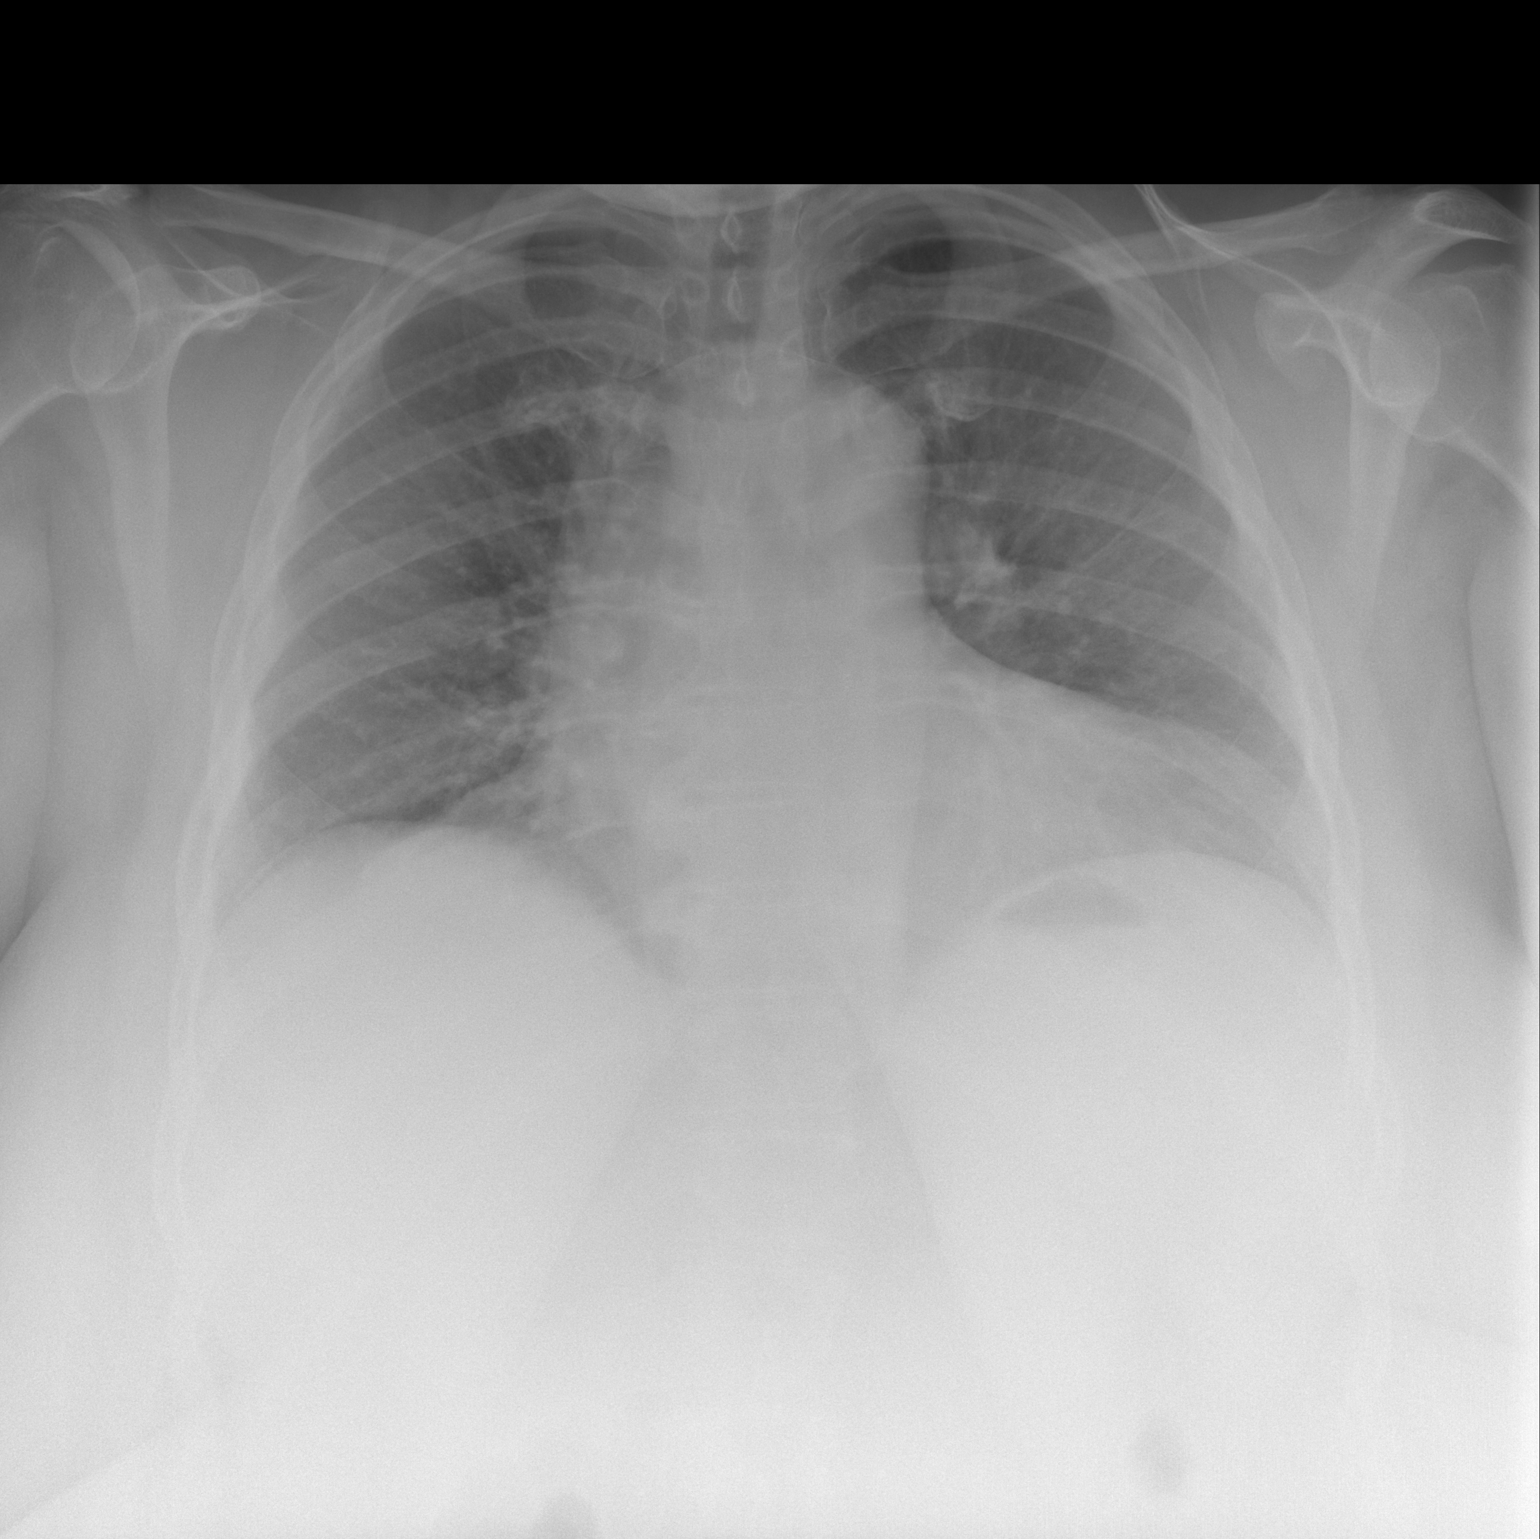

[w chest lat *]
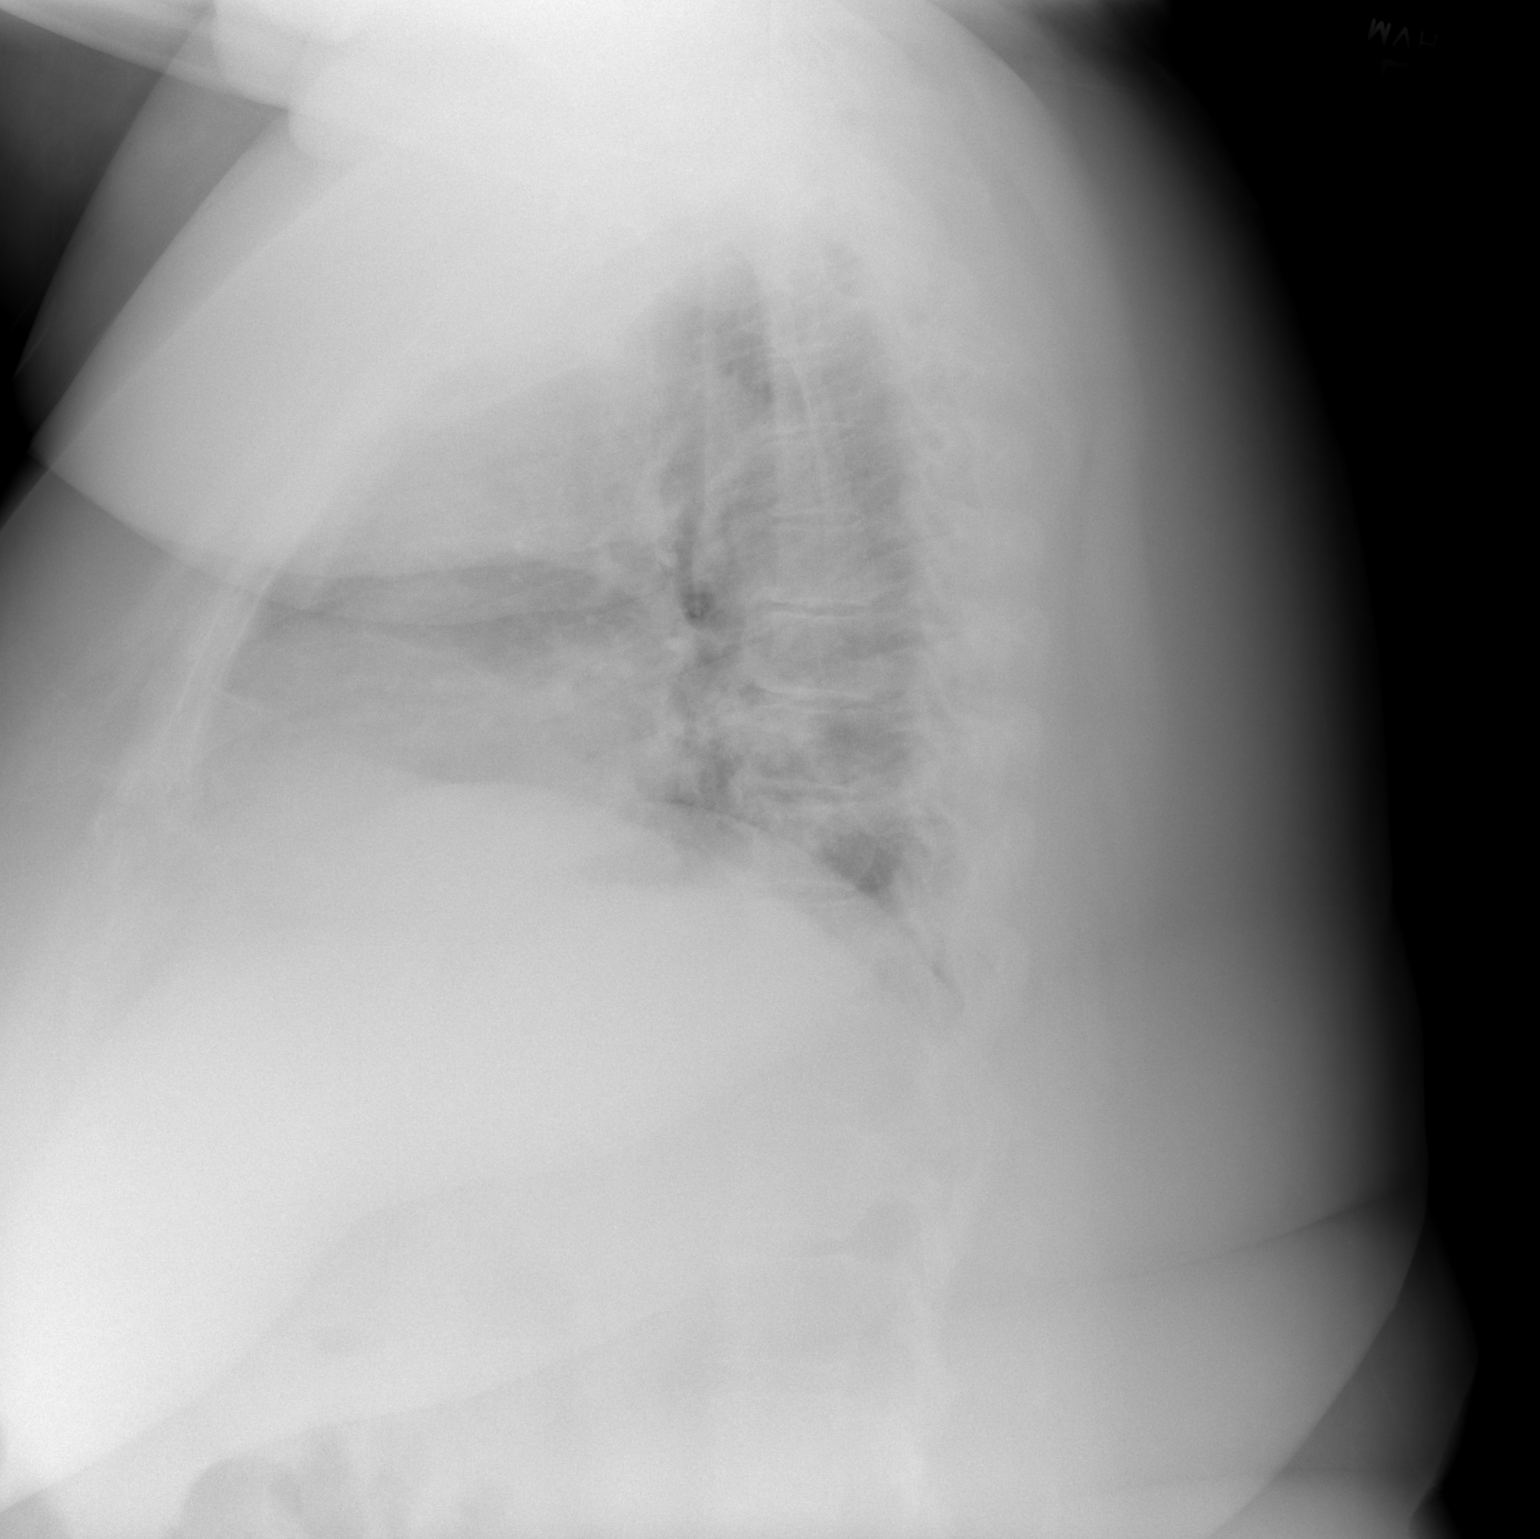

[2 of 2 positions shown; findings below may reference images not displayed]

FINDINGS: Cardiomegaly is again identified.
Mild peribronchial thickening is unchanged.
There is no evidence of focal airspace disease, pleural effusions,
or pneumothorax.
No acute bony abnormalities are identified.
IMPRESSION: Cardiomegaly without evidence of acute cardiopulmonary disease.

## 2011-01-24 NOTE — Discharge Summary (Signed)
NAMEMarland Vincent  JADALIZ, UTLEY             ACCOUNT NO.:  [id number]  MEDICAL RECORD NO.:  [id number]  LOCATION:  [id number]                         FACILITY:  [facility]  PHYSICIAN:  [name], MD       DATE OF BIRTH:  1965/11/16  DATE OF ADMISSION:  01/13/2011 DATE OF DISCHARGE:                              DISCHARGE SUMMARY   PRIMARY CARE PHYSICIAN:  Dr. [name].  DISCHARGE DIAGNOSES: 1. Asthma exacerbation. 2. Morbid obesity. 3. Chronic back pain. 4. Anxiety. 5. Migraine headaches. 6. Obstructive sleep apnea. 7. Urge incontinence.  PAST SURGICAL HISTORY:  Status post tubal ligation and cesarean section.  DISCHARGE MEDICATIONS: 1. Prednisone taper. 2. Albuterol inhaler 1-2 puffs q.4 p.r.n. 3. Allopurinol 100 mg p.o. daily. 4. Aspirin 325 p.o. daily. 5. Advair Diskus 100/50 one puff twice a day. 6. Albuterol nebulizer q.4-6 hours p.r.n. 7. Benadryl 25 mg daily as needed. 8. Clonidine 0.2 mg 1 tablet p.o. twice daily. 9. Eucerin cream 1 application topical twice daily. 10.Hydroxyzine 25 mg 1 tablet p.o. q.6 p.r.n. 11.Lisinopril 20 mg p.o. daily. 12.Magic mouthwash 10 mL p.o. 3 times a day as needed. [id number] 1 application topical transdermal. 14.Protonix 40 mg p.o. daily. 15.Percocet 10/325 one tablet q.4 p.r.n. [id number] shampoo 1 application topical. [id number] lotion one application topical. [id number] 1 to 2 drops in left eye 3 times a day. 19.Triamcinolone topical 0000000 ointment, 1 application topical twice a     day. 20.Vesicare 5 mg 1 tablet p.o. daily. 21.Xanax 1 mg p.o. daily at bedtime as needed.  PROCEDURES:  CT angio of the chest shows negative for pulmonary embolism.  No acute abnormality.  Chest x-ray shows hyperexpansion of the lungs and vascular congestion.  MRSA positive.  OBJECTIVE:  This is a morbidly obese 45 year old female with obstructive sleep apnea using CPAP at home, who presents with shortness of breath and acute on chronic back pain.  The  patient complained of some right- sided chest pain.  She has had an elevated D-dimer and had a CT angio of the chest that did not show any underlying pulmonary embolism.  The chest x-ray shows some mild pulmonary vascular congestion, but a proBNP was within normal limits.  The patient also had a 2D echo on March 2012, that did not show any obvious evidence of heart failure or diastolic dysfunction.  The patient is not requiring any oxygen during the day, but requires up to 4 L of oxygen with the CPAP at night because of desaturation.  Therefore, the patient will be set up with home oxygen at night.  An ambulatory oxygen evaluation during the day will also be obtained and if the patient desaturates on room, then she will require continuous oxygen on a daily basis.  She needs to follow up with her primary care provider, Dr. [name].  The patient has also been requesting narcotic medications on multiple occasions.  She ran out of 1 month prescription within 2 weeks.  The patient does need referral to a pain clinic, which will be set up by Dr. [name].     [name], MD     NA/MEDQ  D:  01/16/2011  T:  01/16/2011  Job:  [id number]  cc:  Dr. [name]  Electronically Signed by [name] MD on 01/24/2011 10:38:06 AM

## 2011-04-25 IMAGING — CR DG CHEST 2V
2 series · 2 of 2 positions shown · non-contrast
Comparison: 07/07/2008

CLINICAL DATA: Hypertension.  Morbid obesity.  Cough.

CHEST - 2 VIEW

[w chest pa]
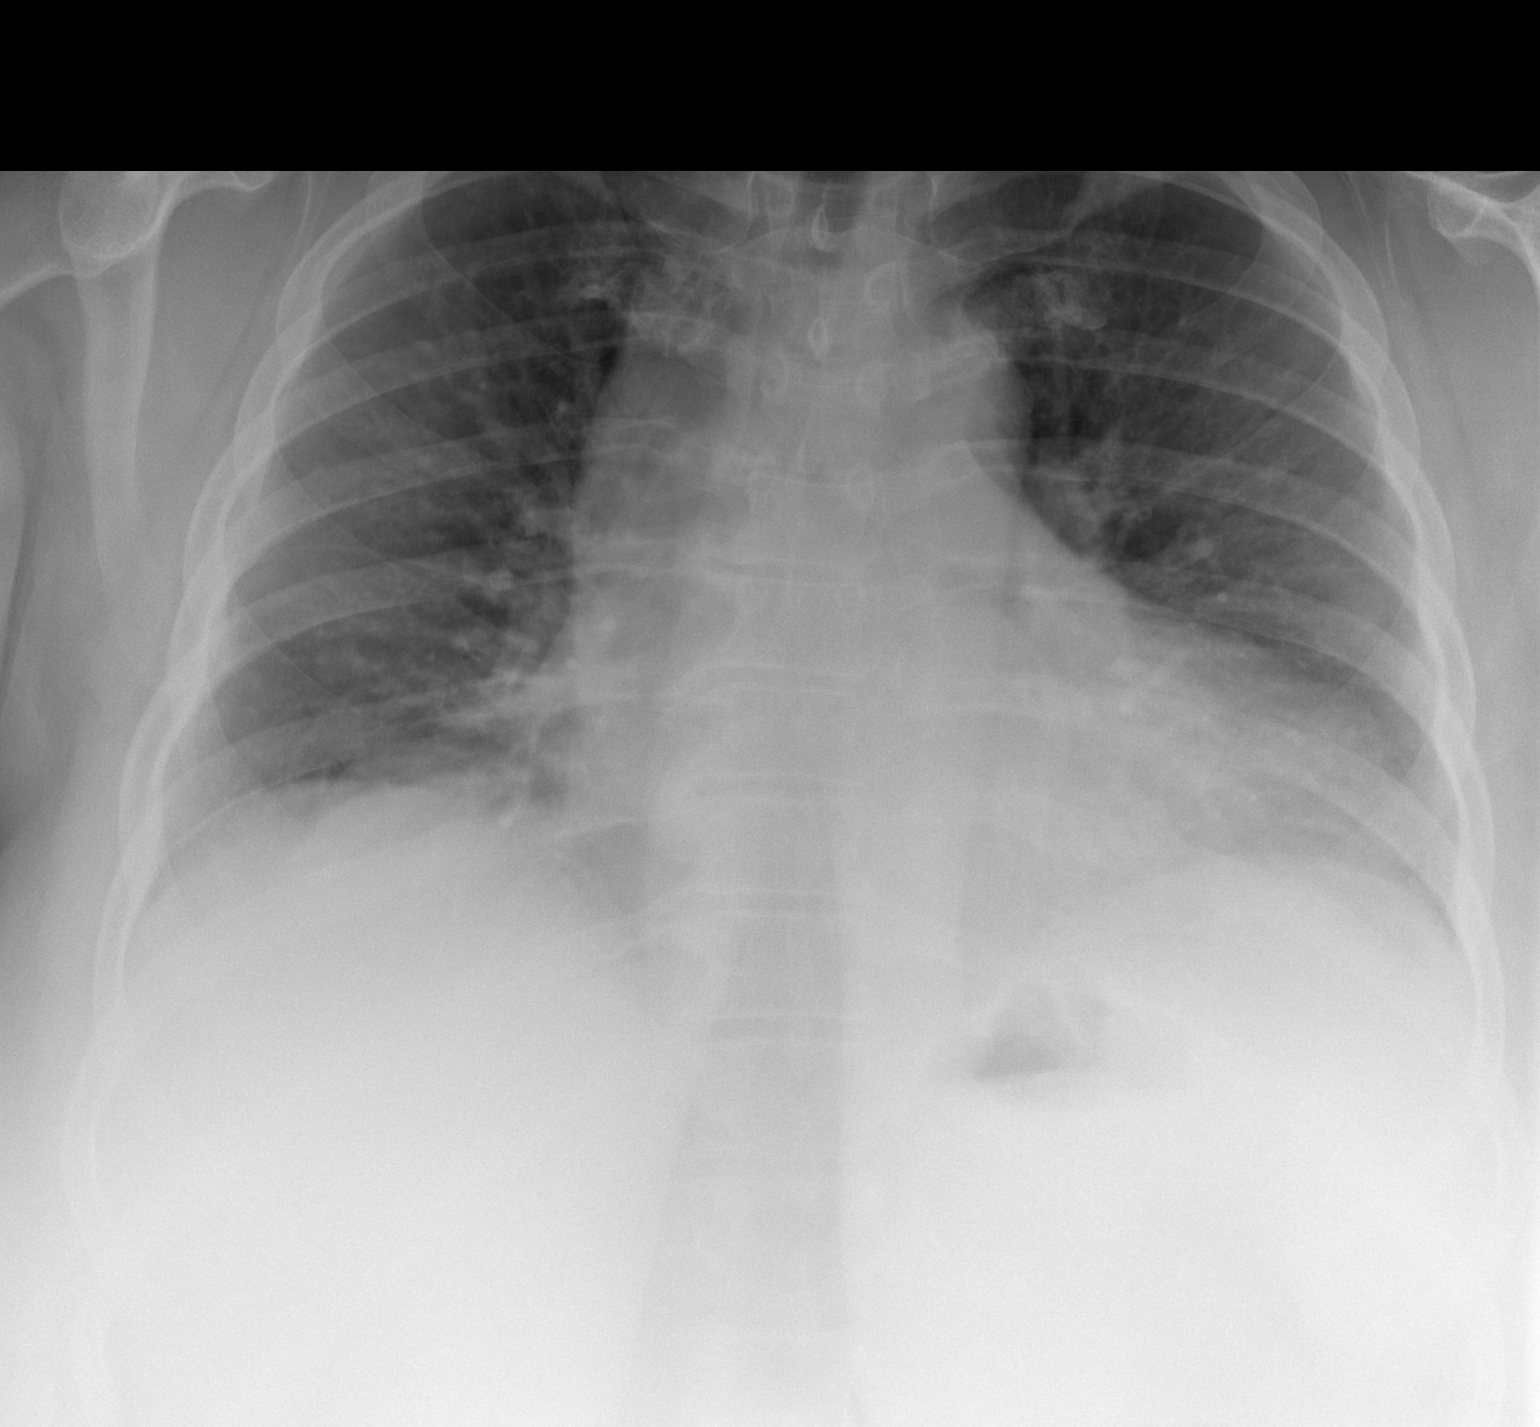

[w chest lat]
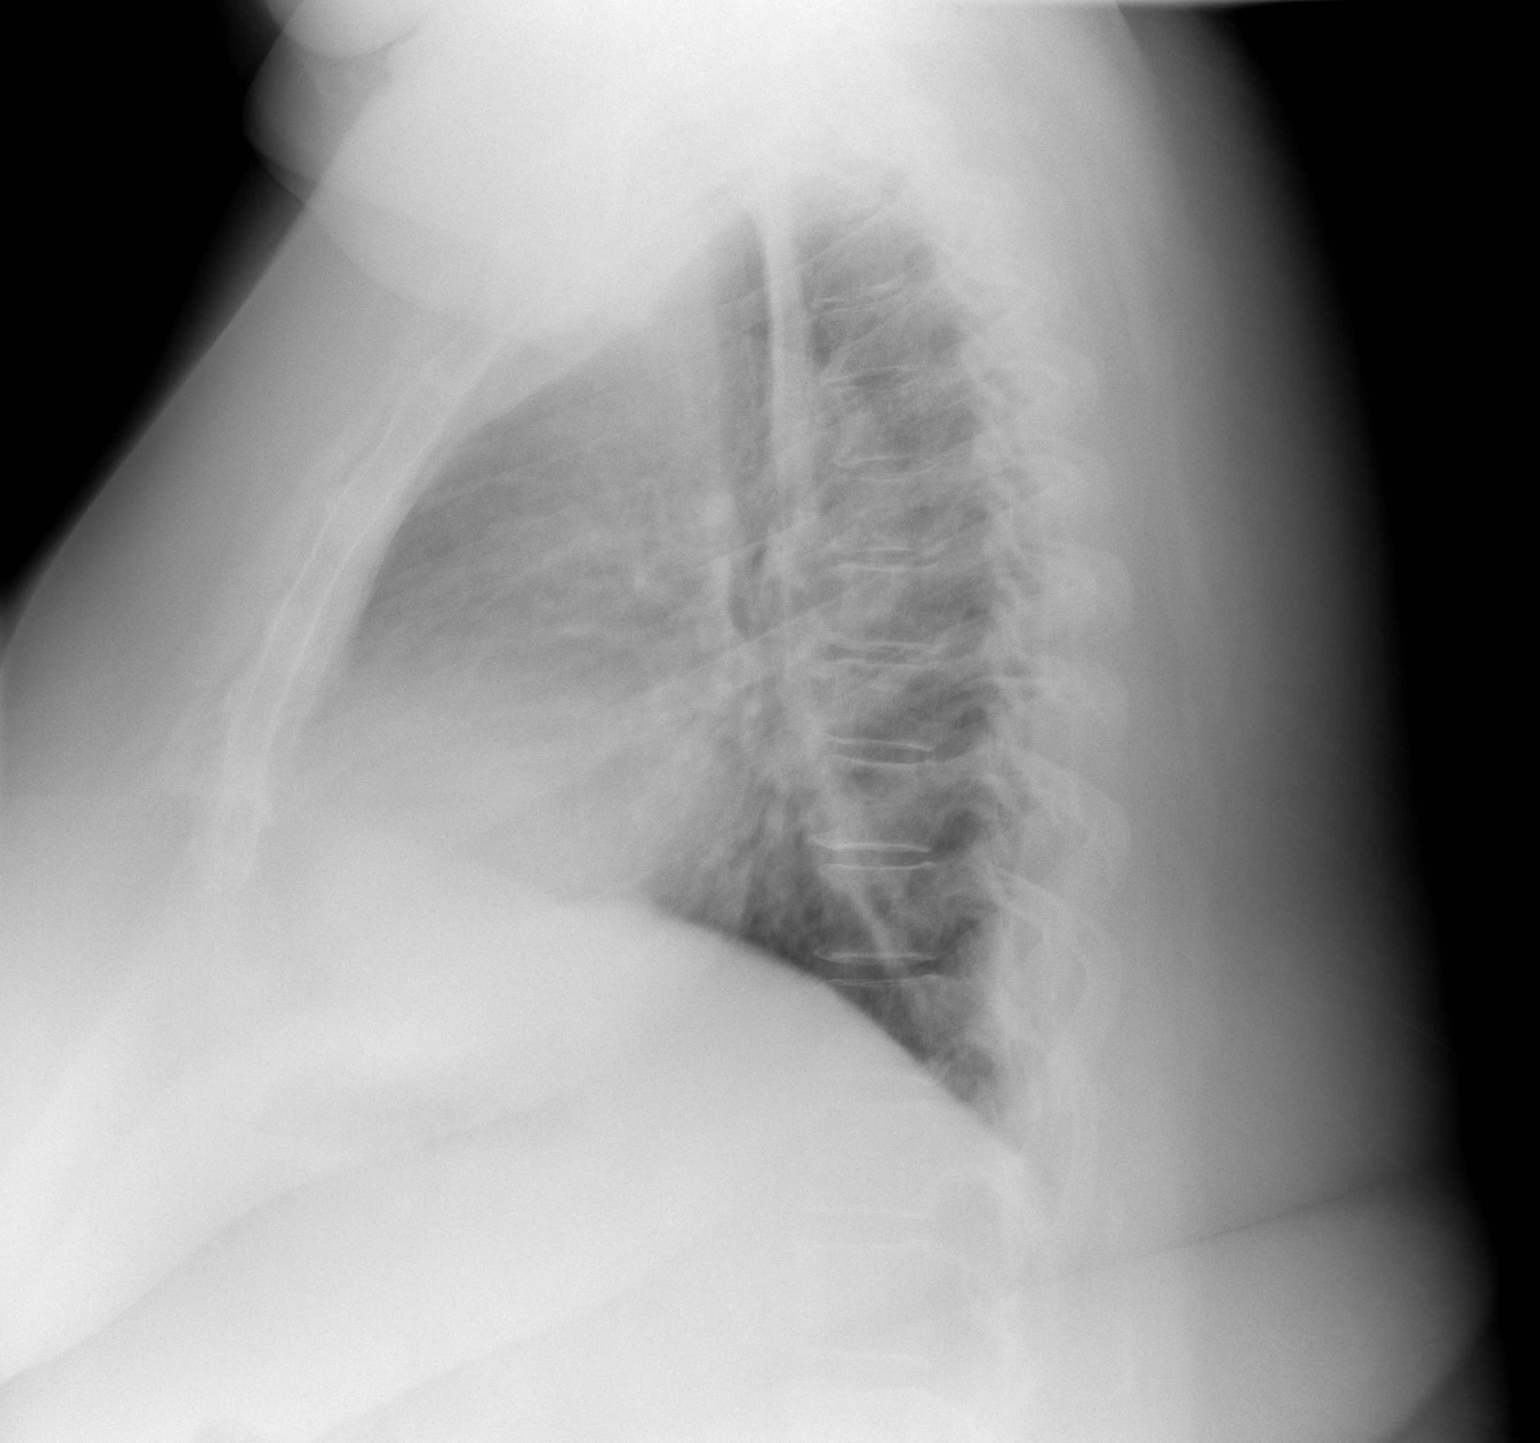

[2 of 2 positions shown; findings below may reference images not displayed]

FINDINGS: Low lung volumes are present, causing crowding of the
pulmonary vasculature.

Mild cardiomegaly noted.  There is a suggestion of mild bibasilar
subsegmental atelectasis.  Lungs appear otherwise clear.  No overt
edema.
IMPRESSION: 1.  Mild cardiomegaly.
2.  Low lung volumes.
3.  Suspected mild bibasilar subsegmental atelectasis.

## 2011-05-09 IMAGING — CR DG FOOT COMPLETE 3+V*L*
3 series · 3 of 3 positions shown · non-contrast
Comparison: Report dated 06/08/2002.

CLINICAL DATA: Left foot pain and swelling for the past 3 days.  No
known injury.

LEFT FOOT - COMPLETE 3+ VIEW

[t foot ap left]
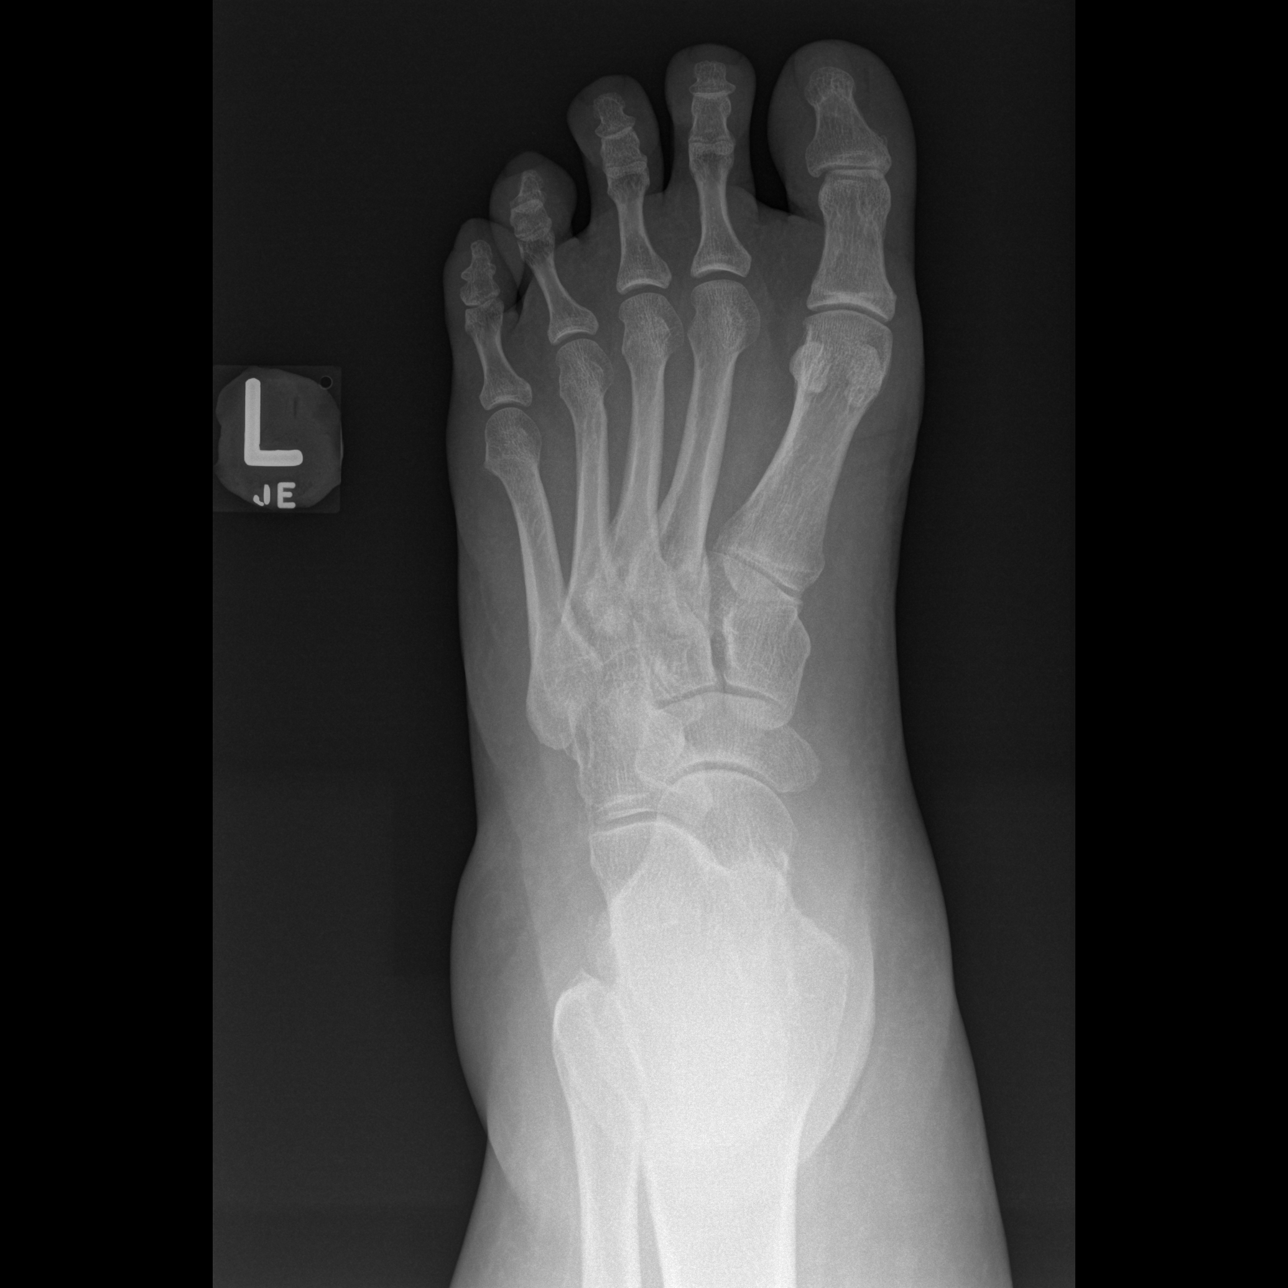

[t foot oblique left]
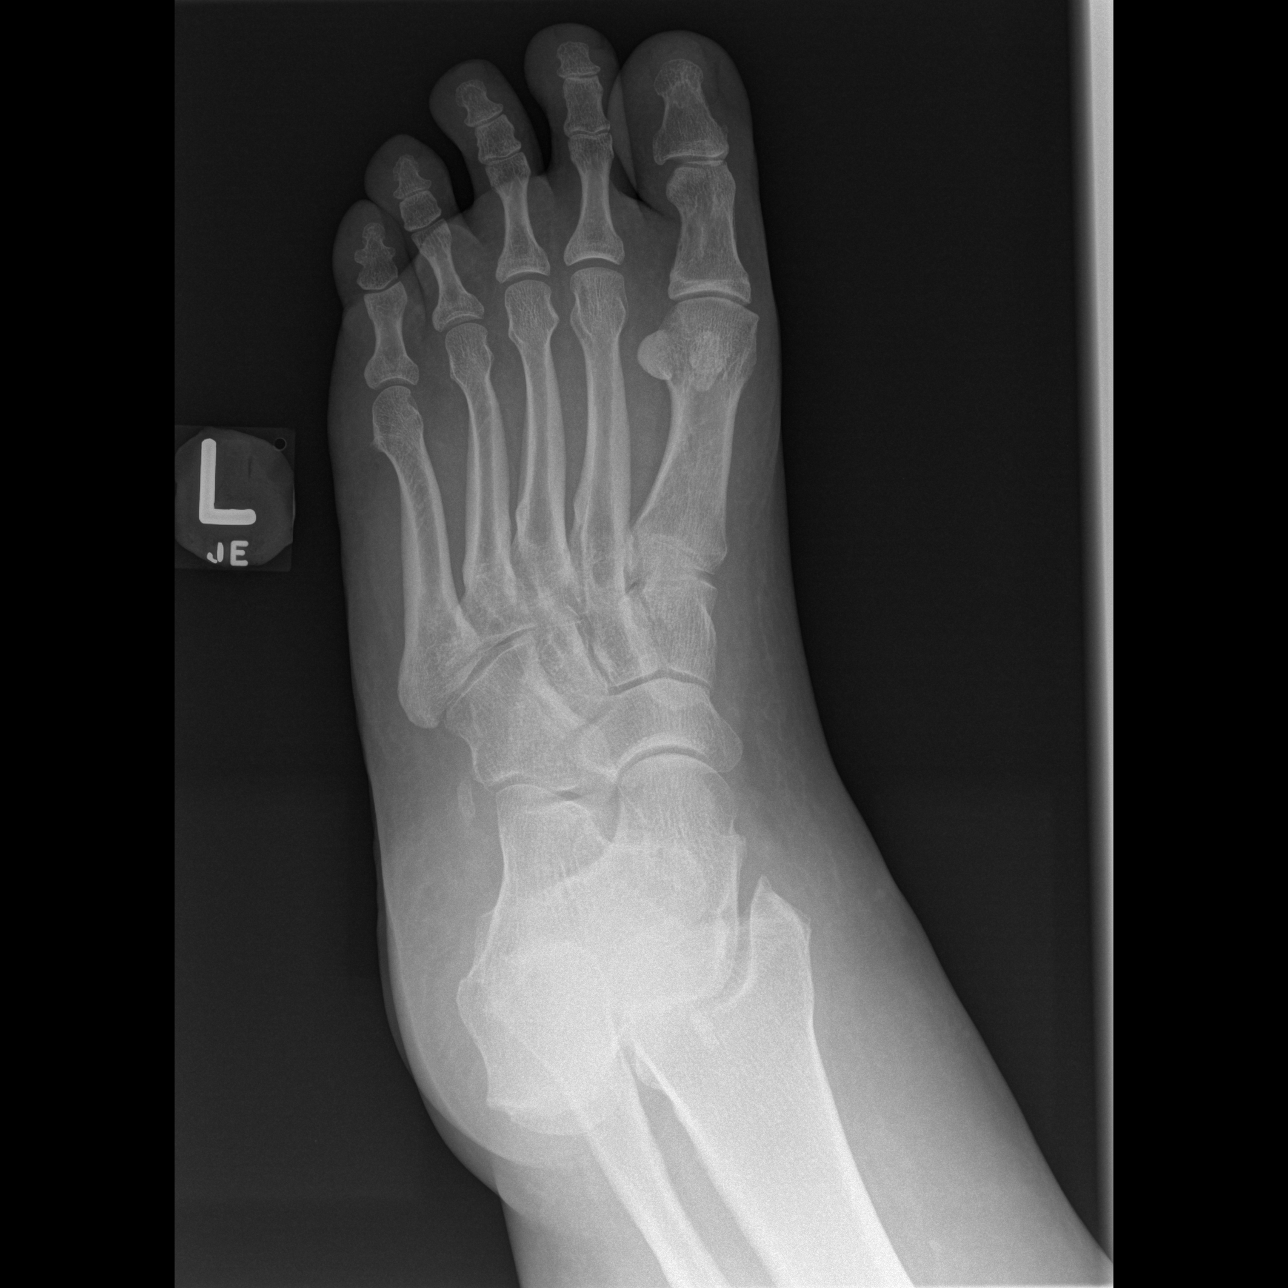

[t foot lat left]
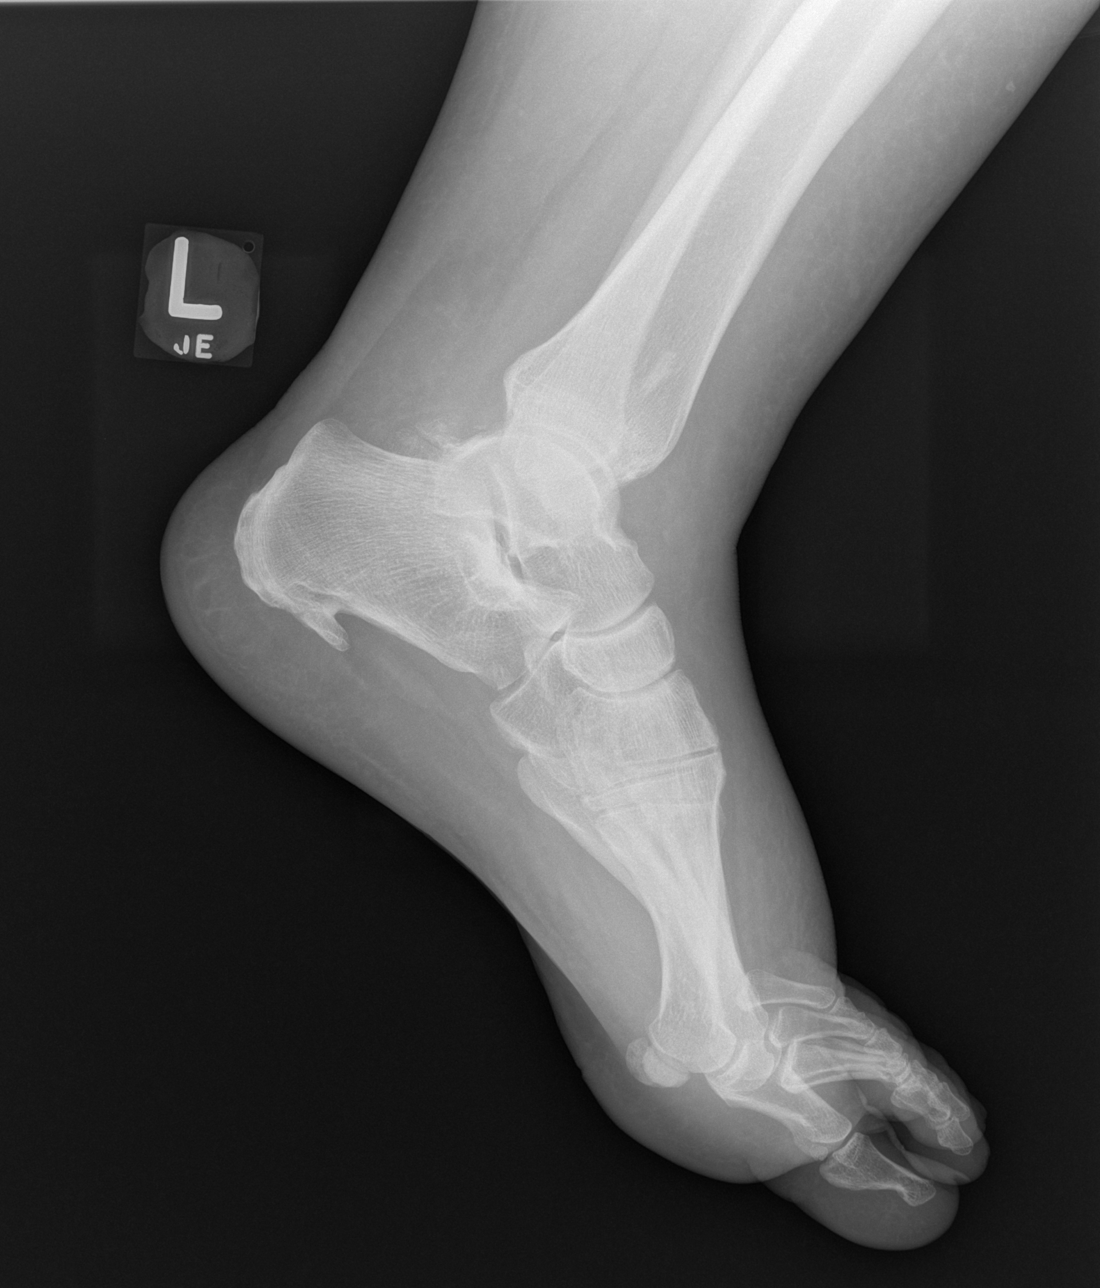

[3 of 3 positions shown; findings below may reference images not displayed]

FINDINGS: Diffuse soft tissue swelling.  Large inferior calcaneal
spur.  Prominent posterior talar calcaneal spur
formation/calcification.  No fracture or dislocation.
IMPRESSION: Diffuse soft tissue swelling and degenerative changes, as described
above.

## 2011-05-12 ENCOUNTER — Encounter (HOSPITAL_COMMUNITY): Payer: Self-pay | Admitting: Emergency Medicine

## 2011-05-12 ENCOUNTER — Inpatient Hospital Stay (HOSPITAL_COMMUNITY)
Admission: EM | Admit: 2011-05-12 | Discharge: 2011-05-19 | DRG: 194 | Disposition: A | Discharge status: Home Health | Payer: Medicaid Other | Attending: Family Medicine | Admitting: Family Medicine

## 2011-05-12 DIAGNOSIS — J4489 Other specified chronic obstructive pulmonary disease: Secondary | ICD-10-CM | POA: Diagnosis present

## 2011-05-12 DIAGNOSIS — L259 Unspecified contact dermatitis, unspecified cause: Secondary | ICD-10-CM | POA: Diagnosis present

## 2011-05-12 DIAGNOSIS — R5381 Other malaise: Secondary | ICD-10-CM | POA: Diagnosis present

## 2011-05-12 DIAGNOSIS — IMO0002 Reserved for concepts with insufficient information to code with codable children: Secondary | ICD-10-CM | POA: Diagnosis present

## 2011-05-12 DIAGNOSIS — N289 Disorder of kidney and ureter, unspecified: Secondary | ICD-10-CM

## 2011-05-12 DIAGNOSIS — G4733 Obstructive sleep apnea (adult) (pediatric): Secondary | ICD-10-CM | POA: Diagnosis present

## 2011-05-12 DIAGNOSIS — I959 Hypotension, unspecified: Secondary | ICD-10-CM | POA: Diagnosis present

## 2011-05-12 DIAGNOSIS — Q828 Other specified congenital malformations of skin: Secondary | ICD-10-CM

## 2011-05-12 DIAGNOSIS — G4700 Insomnia, unspecified: Secondary | ICD-10-CM

## 2011-05-12 DIAGNOSIS — R51 Headache: Secondary | ICD-10-CM

## 2011-05-12 DIAGNOSIS — J449 Chronic obstructive pulmonary disease, unspecified: Secondary | ICD-10-CM | POA: Diagnosis present

## 2011-05-12 DIAGNOSIS — J159 Unspecified bacterial pneumonia: Secondary | ICD-10-CM

## 2011-05-12 DIAGNOSIS — J45909 Unspecified asthma, uncomplicated: Secondary | ICD-10-CM

## 2011-05-12 DIAGNOSIS — M549 Dorsalgia, unspecified: Secondary | ICD-10-CM | POA: Diagnosis present

## 2011-05-12 DIAGNOSIS — M1710 Unilateral primary osteoarthritis, unspecified knee: Secondary | ICD-10-CM | POA: Diagnosis present

## 2011-05-12 DIAGNOSIS — Z5900 Homelessness unspecified: Secondary | ICD-10-CM

## 2011-05-12 DIAGNOSIS — K219 Gastro-esophageal reflux disease without esophagitis: Secondary | ICD-10-CM

## 2011-05-12 DIAGNOSIS — M109 Gout, unspecified: Secondary | ICD-10-CM | POA: Diagnosis present

## 2011-05-12 DIAGNOSIS — G8929 Other chronic pain: Secondary | ICD-10-CM | POA: Diagnosis present

## 2011-05-12 DIAGNOSIS — R109 Unspecified abdominal pain: Secondary | ICD-10-CM | POA: Diagnosis present

## 2011-05-12 DIAGNOSIS — J189 Pneumonia, unspecified organism: Principal | ICD-10-CM

## 2011-05-12 DIAGNOSIS — L0292 Furuncle, unspecified: Secondary | ICD-10-CM

## 2011-05-12 DIAGNOSIS — E875 Hyperkalemia: Secondary | ICD-10-CM | POA: Diagnosis present

## 2011-05-12 DIAGNOSIS — I10 Essential (primary) hypertension: Secondary | ICD-10-CM

## 2011-05-12 DIAGNOSIS — E860 Dehydration: Secondary | ICD-10-CM | POA: Diagnosis present

## 2011-05-12 DIAGNOSIS — N3941 Urge incontinence: Secondary | ICD-10-CM

## 2011-05-12 DIAGNOSIS — N179 Acute kidney failure, unspecified: Secondary | ICD-10-CM | POA: Diagnosis present

## 2011-05-12 DIAGNOSIS — R031 Nonspecific low blood-pressure reading: Secondary | ICD-10-CM

## 2011-05-12 DIAGNOSIS — Z6841 Body Mass Index (BMI) 40.0 and over, adult: Secondary | ICD-10-CM

## 2011-05-12 MED ORDER — DIPHENHYDRAMINE HCL 50 MG/ML IJ SOLN
25.0000 mg | Freq: Once | INTRAMUSCULAR | Status: AC
  Administered 2011-05-13: 25 mg via INTRAVENOUS
  Filled 2011-05-12: qty 1

## 2011-05-12 MED ORDER — METOCLOPRAMIDE HCL 5 MG/ML IJ SOLN
10.0000 mg | Freq: Once | INTRAMUSCULAR | Status: AC
  Administered 2011-05-13: 10 mg via INTRAMUSCULAR
  Filled 2011-05-12: qty 2

## 2011-05-12 MED ORDER — OXYCODONE-ACETAMINOPHEN 5-325 MG PO TABS
2.0000 | ORAL_TABLET | Freq: Once | ORAL | Status: DC
  Filled 2011-05-12: qty 2

## 2011-05-12 NOTE — ED Provider Notes (Addendum)
History     CSN: [id number]  Arrival date & time 05/12/11  1947   First MD Initiated Contact with Patient 05/12/11 2333      Chief Complaint  Patient presents with  . Migraine    (Consider location/radiation/quality/duration/timing/severity/associated sxs/prior treatment) Patient is a 46 y.o. female presenting with migraine. The history is provided by the patient.  Migraine   patient presents with migraine headache x2 days. Pain described as sharp stabbing similar to her prior migraines localized around her entire head. No medications taken prior to arrival. Denies any fever, vomiting, nausea. Symptoms worse with bright lights or sounds. She also notes trouble with her blood pressure fluctuating between being too high or too low. She denies any dizziness or orthostatic changes. No chest pain abdominal pain. Notes no recent changes in her hypotension medication.  Past Medical History  Diagnosis Date  . Asthma   . GERD (gastroesophageal reflux disease)   . Hypertension   . Homelessness   . Low back pain   . 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 No pertinent past surgical history.  Family History  Problem Relation Age of Onset  . Asthma Mother   . Diabetes Father   . Heart disease Father   . Stroke Father     History  Substance Use Topics  . Smoking status: Former Smoker    Types: Cigarettes    Quit date: 03/25/2003  . Smokeless tobacco: Former Systems developer    Quit date: 05/27/1978  . Alcohol Use: No    OB History    Grav Para Term Preterm Abortions TAB SAB Ect Mult Living                  Review of Systems  All other systems reviewed and are negative.    Allergies  Azithromycin; Doxycycline; Ibuprofen; and Sulfamethoxazole w/trimethoprim  Home Medications   Current Outpatient Rx  Name Route Sig Dispense Refill  . ALBUTEROL SULFATE HFA 108 (90 BASE) MCG/ACT IN AERS Inhalation Inhale 1 puff into the lungs every 4 (four) hours as  needed. For asthma relief    . ALBUTEROL SULFATE (2.5 MG/3ML) 0.083% IN NEBU Nebulization Take 2.5 mg by nebulization as needed. Not more than 4 times a day. Come to ED if persistent breathing problem     . ALLOPURINOL 100 MG PO TABS Oral Take 1 tablet (100 mg total) by mouth daily. 30 tablet 2  . AMITRIPTYLINE HCL 75 MG PO TABS Oral Take 75 mg by mouth at bedtime.      . AMMONIUM LACTATE 12 % EX CREA Topical Apply topically 2 (two) times daily. In a thin layer on affected area     . ASPIRIN 325 MG PO TABS Oral Take 325 mg by mouth daily.    [name] CLONIDINE HCL 0.2 MG PO TABS Oral Take 1 tablet (0.2 mg total) by mouth 2 (two) times daily. 60 tablet 11  . EUCERIN EX LOTN Topical Apply topically 2 (two) times daily. On affected areas     . FLUTICASONE-SALMETEROL 250-50 MCG/DOSE IN AEPB Inhalation Inhale 1 puff into the lungs 2 (two) times daily. 1 each 6  . HYDROXYZINE PAMOATE 25 MG PO CAPS Oral Take 25 mg by mouth 3 (three) times daily as needed.    [name] LISINOPRIL 20 MG PO TABS Oral Take 2 tablets (40 mg total) by mouth daily. 30 tablet 5  . PANTOPRAZOLE SODIUM 40 MG PO TBEC Oral Take  1 tablet (40 mg total) by mouth daily. 30 tablet 11  . SALICYLIC ACID 6 % EX SHAM  Apply to scalp 1 -2 times a week 177 mL 11  . SOLIFENACIN SUCCINATE 5 MG PO TABS Oral Take 1 tablet (5 mg total) by mouth daily. 30 tablet 11  . TRAZODONE HCL 50 MG PO TABS Oral Take 2 tablets (100 mg total) by mouth at bedtime. 60 tablet 3  . TRIAMCINOLONE ACETONIDE 0.1 % EX OINT Topical Apply topically 2 (two) times daily. Apply thin layer over rash       BP 100/69  Pulse 89  Temp 98 F (36.7 C)  Resp 16  Wt 411 lb (186.428 kg)  SpO2 99%  LMP 03/24/2009  Physical Exam  Nursing note and vitals reviewed. Constitutional: She is oriented to person, place, and time. She appears well-developed and well-nourished.  Non-toxic appearance. No distress.  HENT:  Head: Normocephalic and atraumatic.  Eyes: Conjunctivae, EOM and lids are  normal. Pupils are equal, round, and reactive to light.  Neck: Normal range of motion. Neck supple. No tracheal deviation present. No mass present.  Cardiovascular: Normal rate, regular rhythm and normal heart sounds.  Exam reveals no gallop.   No murmur heard. Pulmonary/Chest: Effort normal and breath sounds normal. No stridor. No respiratory distress. She has no decreased breath sounds. She has no wheezes. She has no rhonchi. She has no rales.  Abdominal: Soft. Normal appearance and bowel sounds are normal. She exhibits no distension. There is no tenderness. There is no rebound and no CVA tenderness.  Musculoskeletal: Normal range of motion. She exhibits no edema and no tenderness.  Neurological: She is alert and oriented to person, place, and time. She has normal strength. No cranial nerve deficit or sensory deficit. GCS eye subscore is 4. GCS verbal subscore is 5. GCS motor subscore is 6.  Skin: Skin is warm and dry. No abrasion and no rash noted.  Psychiatric: She has a normal mood and affect. Her speech is normal and behavior is normal.    ED Course  Procedures (including critical care time)  Labs Reviewed - No data to display No results found.   No diagnosis found.    MDM  Patient's blood pressure noted. Patient is morbidly obese and her blood pressures been take a on her forearm. Will try to get a adequate blood pressure. In the meantime, patient given pain medication and she will have her renal function evaluated. She has been signed out to Dr. Vergia Alcon, MD 05/12/11 2344   Patient evaluated for cough and headache and low blood pressure. She is requesting Percocet for headache and requesting to be admitted for not feeling well. After 1 L of IV fluids her blood pressure remained systolic 80 and 0000000 with forearm cuff.  I am unable to auscultate manual blood pressure with a large blood pressure cuff.  This is likely due to morbid obesity.  She is not  having symptoms of hypoperfusion otherwise. Labs checked and does have elevated creatinine. Chest x-ray obtained and reviewed with infiltrate right lower lobe. Given cough and symptoms with presentation as above, IV antibiotics initiated and medicine consult for admission. Continue IV fluids. Case discussed with Dr. Orvan Falconer who agrees to admission. Holding narcotic pain medicines for blood pressure. Mild improvement of headache with Reglan. No neuro deficits or indication for CT brain at this time.  Teressa Lower, MD 05/13/11 [phone]

## 2011-05-12 NOTE — ED Notes (Signed)
Pt alert, nad, c/o migraine h/a, onset several days ago, resp even unlabored, skin pwd, denies changes in vision, ambulates via power chair, denies n/v

## 2011-05-13 ENCOUNTER — Encounter (HOSPITAL_COMMUNITY): Payer: Self-pay | Admitting: Internal Medicine

## 2011-05-13 ENCOUNTER — Emergency Department (HOSPITAL_COMMUNITY): Payer: Medicaid Other

## 2011-05-13 DIAGNOSIS — E860 Dehydration: Secondary | ICD-10-CM | POA: Diagnosis present

## 2011-05-13 LAB — CBC
HCT: 37.1 % (ref 36.0–46.0)
Hemoglobin: 12.3 g/dL (ref 12.0–15.0)
Hemoglobin: 11.7 g/dL — ABNORMAL LOW (ref 12.0–15.0)
MCH: 29.2 pg (ref 26.0–34.0)
MCHC: 31.5 g/dL (ref 30.0–36.0)
RBC: 4 MIL/uL (ref 3.87–5.11)
WBC: 7.4 10*3/uL (ref 4.0–10.5)

## 2011-05-13 LAB — CREATININE, SERUM
Creatinine, Ser: 2.59 mg/dL — ABNORMAL HIGH (ref 0.50–1.10)
GFR calc Af Amer: 24 mL/min — ABNORMAL LOW (ref >90)
GFR calc non Af Amer: 21 mL/min — ABNORMAL LOW (ref >90)

## 2011-05-13 LAB — URINALYSIS, ROUTINE W REFLEX MICROSCOPIC
Bilirubin Urine: NEGATIVE
Glucose, UA: NEGATIVE mg/dL
Ketones, ur: NEGATIVE mg/dL
Leukocytes, UA: NEGATIVE
Protein, ur: NEGATIVE mg/dL

## 2011-05-13 LAB — POCT I-STAT, CHEM 8
Creatinine, Ser: 2.8 mg/dL — ABNORMAL HIGH (ref 0.50–1.10)
Glucose, Bld: 95 mg/dL (ref 70–99)
Hemoglobin: 12.9 g/dL (ref 12.0–15.0)
TCO2: 28 mmol/L (ref 0–100)

## 2011-05-13 LAB — CARDIAC PANEL(CRET KIN+CKTOT+MB+TROPI)
CK, MB: 1.6 ng/mL (ref 0.3–4.0)
CK, MB: 1.7 ng/mL (ref 0.3–4.0)
Relative Index: INVALID (ref 0.0–2.5)
Total CK: 63 U/L (ref 7–177)
Total CK: 70 U/L (ref 7–177)

## 2011-05-13 MED ORDER — AMITRIPTYLINE HCL 25 MG PO TABS
75.0000 mg | ORAL_TABLET | Freq: Every day | ORAL | Status: DC
  Administered 2011-05-14 – 2011-05-18 (×6): 75 mg via ORAL
  Filled 2011-05-13 (×4): qty 3
  Filled 2011-05-13: qty 2
  Filled 2011-05-13 (×2): qty 1
  Filled 2011-05-13 (×2): qty 3

## 2011-05-13 MED ORDER — SODIUM CHLORIDE 0.9 % IV BOLUS (SEPSIS)
1000.0000 mL | Freq: Once | INTRAVENOUS | Status: AC
  Administered 2011-05-13: 1000 mL via INTRAVENOUS

## 2011-05-13 MED ORDER — DEXTROSE-NACL 5-0.9 % IV SOLN
INTRAVENOUS | Status: AC
  Administered 2011-05-13 – 2011-05-15 (×6): via INTRAVENOUS

## 2011-05-13 MED ORDER — ALLOPURINOL 100 MG PO TABS
100.0000 mg | ORAL_TABLET | Freq: Every day | ORAL | Status: DC
  Administered 2011-05-13 – 2011-05-19 (×7): 100 mg via ORAL
  Filled 2011-05-13 (×8): qty 1

## 2011-05-13 MED ORDER — ASPIRIN EC 325 MG PO TBEC
325.0000 mg | DELAYED_RELEASE_TABLET | Freq: Every day | ORAL | Status: DC
  Administered 2011-05-13 – 2011-05-19 (×7): 325 mg via ORAL
  Filled 2011-05-13 (×8): qty 1

## 2011-05-13 MED ORDER — DARIFENACIN HYDROBROMIDE ER 7.5 MG PO TB24
7.5000 mg | ORAL_TABLET | Freq: Every day | ORAL | Status: DC
  Administered 2011-05-13 – 2011-05-19 (×7): 7.5 mg via ORAL
  Filled 2011-05-13 (×9): qty 1

## 2011-05-13 MED ORDER — ONDANSETRON HCL 4 MG PO TABS: 4.0000 mg | ORAL_TABLET | Freq: Four times a day (QID) | ORAL | Status: DC | PRN

## 2011-05-13 MED ORDER — MOXIFLOXACIN HCL IN NACL 400 MG/250ML IV SOLN
400.0000 mg | Freq: Once | INTRAVENOUS | Status: AC
  Administered 2011-05-13: 400 mg via INTRAVENOUS
  Filled 2011-05-13: qty 250

## 2011-05-13 MED ORDER — HYDROXYZINE HCL 25 MG PO TABS
25.0000 mg | ORAL_TABLET | Freq: Three times a day (TID) | ORAL | Status: DC | PRN
  Administered 2011-05-13 – 2011-05-14 (×3): 25 mg via ORAL
  Filled 2011-05-13 (×4): qty 1

## 2011-05-13 MED ORDER — EUCERIN EX LOTN
TOPICAL_LOTION | Freq: Two times a day (BID) | CUTANEOUS | Status: DC
  Administered 2011-05-13 – 2011-05-19 (×11): via TOPICAL
  Filled 2011-05-13 (×8): qty 1

## 2011-05-13 MED ORDER — TRIAMCINOLONE ACETONIDE 0.1 % EX OINT
TOPICAL_OINTMENT | Freq: Two times a day (BID) | CUTANEOUS | Status: DC
  Administered 2011-05-13 – 2011-05-19 (×11): via TOPICAL
  Filled 2011-05-13 (×3): qty 15

## 2011-05-13 MED ORDER — ALBUTEROL SULFATE HFA 108 (90 BASE) MCG/ACT IN AERS: 1.0000 | INHALATION_SPRAY | RESPIRATORY_TRACT | Status: DC | PRN

## 2011-05-13 MED ORDER — ENOXAPARIN SODIUM 100 MG/ML ~~LOC~~ SOLN
90.0000 mg | SUBCUTANEOUS | Status: DC
  Administered 2011-05-17 – 2011-05-18 (×2): 90 mg via SUBCUTANEOUS
  Filled 2011-05-13 (×8): qty 1

## 2011-05-13 MED ORDER — DOCUSATE SODIUM 100 MG PO CAPS
100.0000 mg | ORAL_CAPSULE | Freq: Two times a day (BID) | ORAL | Status: DC
  Administered 2011-05-13 – 2011-05-19 (×13): 100 mg via ORAL
  Filled 2011-05-13 (×16): qty 1

## 2011-05-13 MED ORDER — OXYCODONE HCL 5 MG PO TABS
5.0000 mg | ORAL_TABLET | ORAL | Status: DC | PRN
  Administered 2011-05-13 – 2011-05-14 (×5): 5 mg via ORAL
  Filled 2011-05-13 (×5): qty 1

## 2011-05-13 MED ORDER — HYDROXYZINE PAMOATE 25 MG PO CAPS
25.0000 mg | ORAL_CAPSULE | Freq: Three times a day (TID) | ORAL | Status: DC | PRN
  Filled 2011-05-13: qty 1

## 2011-05-13 MED ORDER — ENOXAPARIN SODIUM 40 MG/0.4ML ~~LOC~~ SOLN: 40.0000 mg | SUBCUTANEOUS | Status: DC

## 2011-05-13 MED ORDER — MOXIFLOXACIN HCL IN NACL 400 MG/250ML IV SOLN
400.0000 mg | INTRAVENOUS | Status: DC
  Administered 2011-05-14: 400 mg via INTRAVENOUS
  Filled 2011-05-13 (×2): qty 250

## 2011-05-13 MED ORDER — ONDANSETRON HCL 4 MG/2ML IJ SOLN
4.0000 mg | Freq: Four times a day (QID) | INTRAMUSCULAR | Status: DC | PRN
  Administered 2011-05-15: 4 mg via INTRAVENOUS
  Filled 2011-05-13 (×2): qty 2

## 2011-05-13 MED ORDER — TRAZODONE HCL 100 MG PO TABS
100.0000 mg | ORAL_TABLET | Freq: Every day | ORAL | Status: DC
  Administered 2011-05-14 – 2011-05-18 (×6): 100 mg via ORAL
  Filled 2011-05-13 (×8): qty 1

## 2011-05-13 MED ORDER — FLUTICASONE-SALMETEROL 250-50 MCG/DOSE IN AEPB
1.0000 | INHALATION_SPRAY | Freq: Two times a day (BID) | RESPIRATORY_TRACT | Status: DC
  Administered 2011-05-13 – 2011-05-18 (×9): 1 via RESPIRATORY_TRACT
  Filled 2011-05-13 (×2): qty 14

## 2011-05-13 MED ORDER — ALBUTEROL SULFATE (5 MG/ML) 0.5% IN NEBU
2.5000 mg | INHALATION_SOLUTION | RESPIRATORY_TRACT | Status: DC | PRN
  Administered 2011-05-17 (×2): 2.5 mg via RESPIRATORY_TRACT
  Filled 2011-05-13 (×2): qty 0.5

## 2011-05-13 MED ORDER — ASPIRIN 325 MG PO TABS: 325.0000 mg | ORAL_TABLET | Freq: Every day | ORAL | Status: DC

## 2011-05-13 MED ORDER — SODIUM CHLORIDE 0.9 % IJ SOLN
3.0000 mL | Freq: Two times a day (BID) | INTRAMUSCULAR | Status: DC
  Administered 2011-05-14 – 2011-05-19 (×8): 3 mL via INTRAVENOUS

## 2011-05-13 MED ORDER — PANTOPRAZOLE SODIUM 40 MG PO TBEC
40.0000 mg | DELAYED_RELEASE_TABLET | Freq: Every day | ORAL | Status: DC
  Administered 2011-05-13 – 2011-05-19 (×7): 40 mg via ORAL
  Filled 2011-05-13 (×8): qty 1

## 2011-05-13 MED ORDER — AMMONIUM LACTATE 12 % EX CREA
TOPICAL_CREAM | Freq: Two times a day (BID) | CUTANEOUS | Status: DC
  Administered 2011-05-13 – 2011-05-19 (×10): via TOPICAL
  Filled 2011-05-13 (×8): qty 1

## 2011-05-13 NOTE — Progress Notes (Signed)
  Baseline creatinine 1.1, continue IVF  continue to monitor [facility] *2  Continue IV fluids AND AVELOX

## 2011-05-13 NOTE — ED Notes (Signed)
Unable to palpate peripheral pulses for manual BP.

## 2011-05-13 NOTE — ED Notes (Signed)
Renal ultrasound tech at bedside

## 2011-05-13 NOTE — H&P (Signed)
PCP: Dr. [name] of [facility]   Chief Complaint: Headache.   HPI: Kara Vincent is an 46 y.o. female with history of chronic headache which she usually takes a Percocet on a daily basis, labile hypertension, morbid obesity, asthma, GERD, presents to the emergency room complaining of headaches. Her headache is most severe tonight but not unusual for her. Evaluation in the emergency room however, revealed that she is hypotensive with systolic blood pressure of 90. Her chest x-ray also showed possible pneumonia. She has no leukocytosis and no fever. She has not been feeling well. She felt lightheadedness. She request to be admitted to the hospital for further treatment. She has no significant cough, fever, chills, ill contact or any distant travel.  Rewiew of Systems:  The patient denies anorexia, fever, weight loss,, vision loss, decreased hearing, hoarseness, chest pain, syncope, , peripheral edema, balance deficits, hemoptysis, abdominal pain, melena, hematochezia, severe indigestion/heartburn, hematuria, incontinence, genital sores, muscle weakness, suspicious skin lesions, transient blindness, difficulty walking, depression, unusual weight change, abnormal bleeding, enlarged lymph nodes, angioedema, and breast masses.   Past Medical History  Diagnosis Date  . Asthma   . GERD (gastroesophageal reflux disease)   . Hypertension   . Homelessness   . Low back pain   . 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 No pertinent past surgical history.  Medications:  HOME MEDS: Prior to Admission medications   Medication Sig Start Date End Date Taking? Authorizing Provider  albuterol (PROVENTIL HFA) 108 (90 BASE) MCG/ACT inhaler Inhale 1 puff into the lungs every 4 (four) hours as needed. For asthma relief   Yes Historical Provider, MD  albuterol (PROVENTIL) (2.5 MG/3ML) 0.083% nebulizer solution Take 2.5 mg by nebulization as needed. Not more than 4 times a  day. Come to ED if persistent breathing problem    Yes Historical Provider, MD  allopurinol (ZYLOPRIM) 100 MG tablet Take 1 tablet (100 mg total) by mouth daily. 07/01/10 07/01/11 Yes Lysle Pearl, MD  amitriptyline (ELAVIL) 75 MG tablet Take 75 mg by mouth at bedtime.     Yes Historical Provider, MD  ammonium lactate (AMLACTIN) 12 % cream Apply topically 2 (two) times daily. In a thin layer on affected area    Yes Historical Provider, MD  aspirin 325 MG tablet Take 325 mg by mouth daily.   Yes Historical Provider, MD  cloNIDine (CATAPRES) 0.2 MG tablet Take 1 tablet (0.2 mg total) by mouth 2 (two) times daily. 07/22/10  Yes Lysle Pearl, MD  Emollient (EUCERIN) lotion Apply topically 2 (two) times daily. On affected areas    Yes Historical Provider, MD  Fluticasone-Salmeterol (ADVAIR DISKUS) 250-50 MCG/DOSE AEPB Inhale 1 puff into the lungs 2 (two) times daily. 08/02/10 08/02/11 Yes Trish Fountain, MD  hydrOXYzine (VISTARIL) 25 MG capsule Take 25 mg by mouth 3 (three) times daily as needed.   Yes Historical Provider, MD  lisinopril (PRINIVIL,ZESTRIL) 20 MG tablet Take 2 tablets (40 mg total) by mouth daily. 07/01/10 07/01/11 Yes Lysle Pearl, MD  pantoprazole (PROTONIX) 40 MG tablet Take 1 tablet (40 mg total) by mouth daily. 07/01/10 07/01/11 Yes Lysle Pearl, MD  Salicylic Acid (SALEX) 6 % SHAM Apply to scalp 1 -2 times a week 07/22/10 07/22/11 Yes Lysle Pearl, MD  solifenacin (VESICARE) 5 MG tablet Take 1 tablet (5 mg total) by mouth daily. 07/22/10 07/22/11 Yes Lysle Pearl, MD  traZODone (DESYREL) 50 MG tablet Take 2 tablets (100 mg total) by  mouth at bedtime. 08/02/10  Yes Trish Fountain, MD  triamcinolone (KENALOG) 0.1 % ointment Apply topically 2 (two) times daily. Apply thin layer over rash    Yes Historical Provider, MD     Allergies:  Allergies  Allergen Reactions  . Azithromycin     REACTION: Rash and hives  . Doxycycline     rash  . Ibuprofen     REACTION: Rash and dizziness  .  Sulfamethoxazole W/Trimethoprim     Social History:   reports that she quit smoking about 8 years ago. Her smoking use included Cigarettes. She quit smokeless tobacco use about 32 years ago. She reports that she does not drink alcohol or use illicit drugs.  Family History: Family History  Problem Relation Age of Onset  . Asthma Mother   . Diabetes Father   . Heart disease Father   . Stroke Father      Physical Exam: Filed Vitals:   05/13/11 0057 05/13/11 0231 05/13/11 0317 05/13/11 0400  BP:  95/45 92/41 117/70  Pulse:   74   Temp:      Resp:   16   Weight:      SpO2: 93%  97%    Blood pressure 117/70, pulse 74, temperature 98 F (36.7 C), resp. rate 16, weight 186.428 kg (411 lb), last menstrual period 03/24/2009, SpO2 97.00%.  GEN:  Pleasant morbidly obese person lying in the stretcher in no acute distress; cooperative with exam PSYCH:  alert and oriented x4; does not appear anxious does not appear depressed; affect is normal HEENT: Mucous membranes pink and anicteric; PERRLA; EOM intact; no cervical lymphadenopathy nor thyromegaly or carotid bruit; no JVD; Breasts:: Not examined CHEST WALL: No tenderness CHEST: Normal respiration, clear to auscultation bilaterally HEART: Regular rate and rhythm; no murmurs rubs or gallops BACK: No kyphosis or scoliosis; no CVA tenderness ABDOMEN: Obese, soft non-tender; no masses, no organomegaly, normal abdominal bowel sounds; no pannus; no intertriginous candida. Rectal Exam: Not done EXTREMITIES: No bone or joint deformity; age-appropriate arthropathy of the hands and knees; with edema and venous stasis changes. Genitalia: not examined PULSES: 2+ and symmetric SKIN: Normal hydration no rash or ulceration CNS: Cranial nerves 2-12 grossly intact no focal neurologic deficit   Labs & Imaging Results for orders placed during the hospital encounter of 05/12/11 (from the past 48 hour(s))  CBC     Status: Normal   Collection Time    05/12/11 11:50 PM      Component Value Range Comment   WBC 9.1  4.0 - 10.5 (K/uL)    RBC 4.21  3.87 - 5.11 (MIL/uL)    Hemoglobin 12.3  12.0 - 15.0 (g/dL)    HCT 39.0  36.0 - 46.0 (%)    MCV 92.6  78.0 - 100.0 (fL)    MCH 29.2  26.0 - 34.0 (pg)    MCHC 31.5  30.0 - 36.0 (g/dL)    RDW 14.2  11.5 - 15.5 (%)    Platelets 256  150 - 400 (K/uL)   POCT I-STAT, CHEM 8     Status: Abnormal   Collection Time   05/13/11 12:10 AM      Component Value Range Comment   Sodium 139  135 - 145 (mEq/L)    Potassium 4.7  3.5 - 5.1 (mEq/L)    Chloride 107  96 - 112 (mEq/L)    BUN 79 (*) 6 - 23 (mg/dL)    Creatinine, Ser 2.80 (*) 0.50 - 1.10 (mg/dL)  Glucose, Bld 95  70 - 99 (mg/dL)    Calcium, Ion 1.08 (*) 1.12 - 1.32 (mmol/L)    TCO2 28  0 - 100 (mmol/L)    Hemoglobin 12.9  12.0 - 15.0 (g/dL)    HCT 38.0  36.0 - 46.0 (%)   URINALYSIS, ROUTINE W REFLEX MICROSCOPIC     Status: Normal   Collection Time   05/13/11  2:51 AM      Component Value Range Comment   Color, Urine YELLOW  YELLOW     APPearance CLEAR  CLEAR     Specific Gravity, Urine 1.019  1.005 - 1.030     pH 5.0  5.0 - 8.0     Glucose, UA NEGATIVE  NEGATIVE (mg/dL)    Hgb urine dipstick NEGATIVE  NEGATIVE     Bilirubin Urine NEGATIVE  NEGATIVE     Ketones, ur NEGATIVE  NEGATIVE (mg/dL)    Protein, ur NEGATIVE  NEGATIVE (mg/dL)    Urobilinogen, UA 0.2  0.0 - 1.0 (mg/dL)    Nitrite NEGATIVE  NEGATIVE     Leukocytes, UA NEGATIVE  NEGATIVE  MICROSCOPIC NOT DONE ON URINES WITH NEGATIVE PROTEIN, BLOOD, LEUKOCYTES, NITRITE, OR GLUCOSE <1000 mg/dL.   Dg Chest 2 View  05/13/2011  *RADIOLOGY REPORT*  Clinical Data: Cough and shortness of breath; hypotension.  CHEST - 2 VIEW  Comparison: Chest radiograph performed 01/13/2011, and CTA of the chest performed 01/15/2011  Findings: The lungs are well-aerated.  Mild vascular congestion is noted, without significant pulmonary edema.  Mild right lower lobe opacity is suggested, on correlation with the  lateral view; pneumonia cannot be excluded.  There is no evidence of pleural effusion or pneumothorax.  The heart is enlarged; the mediastinal contour is within normal limits.  No acute osseous abnormalities are seen.  IMPRESSION:  1.  Mild right lower lobe airspace opacity is suggested, on correlation with the lateral view; this could reflect mild pneumonia. 2.  Mild vascular congestion and cardiomegaly noted, without significant edema.  Original Report Authenticated By: Santa Lighter, M.D.      Assessment Present on Admission:  .PNA (pneumonia) .Dehydration .OSTEOARTHRITIS, KNEES, BILATERAL .BACK PAIN .Headache .OBSTRUCTIVE SLEEP APNEA   PLAN: Her blood pressure is too low and she is slightly  symptomatic. I suspect that she is volume depleted and currently is on 2 blood pressure medications with a diuretic. Will stop her clonidine and lisinopril along with her diuretic, and start her on IV fluids. Not convinced that she has pneumonia, but is reasonable to initiate Avelox IV because her chest x-ray suggested a right lower lobe infiltrate. For her headache will continue her Percocet, but I did tell her that she could have analgesic rebound headache as well. She is stable, full code, and will be admitted to [facility] hospitalist service.   Other plans as per orders.  Sarie Stall 05/13/2011, 6:08 AM

## 2011-05-13 NOTE — ED Notes (Signed)
First set of blood cultures ordered drawn from new IV site that just started.

## 2011-05-13 NOTE — ED Notes (Signed)
Patient is resting comfortably.  Pt has refused dinner tray.  States she doesn't eat baked fish which is what is on the tray.  Doesn't want anything else offered.

## 2011-05-13 NOTE — ED Notes (Signed)
A bariatric bed was brought down for the patient but the patient refused the bed stating " I am comfortable in this bed."  The patient was advised that the bed that was being provided was larger and better suited for her but she refused a second time.  Patient was told by the Nurse that the bed was specifically for skin break down issues and that the patient.  The patient then refused the bed again.  Patient is agitated at the fact that she is not being transported to the floor and has voiced her concern of such.

## 2011-05-14 LAB — CBC
Hemoglobin: 12.2 g/dL (ref 12.0–15.0)
MCHC: 31.3 g/dL (ref 30.0–36.0)
RBC: 4.13 MIL/uL (ref 3.87–5.11)

## 2011-05-14 LAB — BASIC METABOLIC PANEL
GFR calc Af Amer: 38 mL/min — ABNORMAL LOW (ref >90)
GFR calc non Af Amer: 33 mL/min — ABNORMAL LOW (ref >90)
Potassium: 5 mEq/L (ref 3.5–5.1)
Sodium: 141 mEq/L (ref 135–145)

## 2011-05-14 LAB — MRSA PCR SCREENING: MRSA by PCR: POSITIVE — AB

## 2011-05-14 MED ORDER — MUPIROCIN 2 % EX OINT
1.0000 "application " | TOPICAL_OINTMENT | Freq: Two times a day (BID) | CUTANEOUS | Status: AC
  Administered 2011-05-14 – 2011-05-18 (×9): 1 via NASAL
  Filled 2011-05-14 (×2): qty 22

## 2011-05-14 MED ORDER — OXYCODONE-ACETAMINOPHEN 5-325 MG PO TABS
2.0000 | ORAL_TABLET | Freq: Four times a day (QID) | ORAL | Status: DC | PRN
  Administered 2011-05-14 – 2011-05-19 (×15): 2 via ORAL
  Filled 2011-05-14 (×14): qty 2

## 2011-05-14 MED ORDER — ALPRAZOLAM 1 MG PO TABS
1.0000 mg | ORAL_TABLET | Freq: Two times a day (BID) | ORAL | Status: DC
  Administered 2011-05-14 – 2011-05-18 (×9): 1 mg via ORAL
  Filled 2011-05-14 (×11): qty 1

## 2011-05-14 MED ORDER — CHLORHEXIDINE GLUCONATE CLOTH 2 % EX PADS
6.0000 | MEDICATED_PAD | Freq: Every day | CUTANEOUS | Status: AC
  Administered 2011-05-15 – 2011-05-18 (×5): 6 via TOPICAL

## 2011-05-14 MED ORDER — HYDROXYZINE HCL 25 MG PO TABS
25.0000 mg | ORAL_TABLET | Freq: Four times a day (QID) | ORAL | Status: DC | PRN
  Administered 2011-05-14 – 2011-05-17 (×3): 25 mg via ORAL
  Filled 2011-05-14 (×2): qty 1

## 2011-05-14 MED ORDER — GUAIFENESIN ER 600 MG PO TB12
600.0000 mg | ORAL_TABLET | Freq: Two times a day (BID) | ORAL | Status: DC
  Administered 2011-05-14 – 2011-05-19 (×10): 600 mg via ORAL
  Filled 2011-05-14 (×13): qty 1

## 2011-05-14 NOTE — ED Notes (Signed)
Pt bathed self w/ set up of equipment

## 2011-05-14 NOTE — Progress Notes (Signed)
PROGRESS NOTE  Kara Vincent [id number] DOB: 15-Feb-1966 DOA: 05/12/2011 PCP: No primary provider on file.  Brief narrative: 46 year old female admitted with malaise and acute kidney injury  Past medical history: Chronic headache, labile hypertension, morbid obesity, asthma, GERD, darier disease  Consultants:  None  Procedures:  Chest x-ray 2/19 = mild right lower lobe airspace opacity suggested-possible pneumonia, mild vascular congestion cardiomegaly noted  Ultrasound 2/19 = normal  Antibiotics:  Moxifloxacin 400 mg 2/18 >>>   Subjective  Multiple subjective complaints today. States pain in right flank area and into abdomen. This is not usual for her Also states has significant constipation. States productive sputum yellow color. States mild shortness of breath. Denies any specific chest pain. No fever   Objective   Interim History: Chart reviewed  Objective: Filed Vitals:   05/14/11 0732 05/14/11 0821 05/14/11 0855 05/14/11 1300  BP: 86/51 118/71 132/82 124/79  Pulse: 89  82 78  Temp: 97.9 F (36.6 C)  97.5 F (36.4 C) 98.1 F (36.7 C)  TempSrc: Oral  Oral   Resp: 16  10 12   Height:   5\' 5"  (1.651 m)   Weight:   188.651 kg (415 lb 14.4 oz)   SpO2: 100%  93% 100%    Intake/Output Summary (Last 24 hours) at 05/14/11 1509 Last data filed at 05/14/11 1100  Gross per 24 hour  Intake      0 ml  Output    601 ml  Net   -601 ml    Exam:  General: Alert, obese Caucasian female Cardiovascular: S1-S2 no murmur rub or gallop Respiratory: Clinically clear no added sound Abdomen: Morbidly obese no meaningful exam appreciable   Data Reviewed: Basic Metabolic Panel:  Lab [id number] 05/13/11 0625 05/13/11 0010  NA 141 -- 139  K 5.0 -- 4.7  CL 107 -- 107  CO2 26 -- --  GLUCOSE 98 -- 95  BUN 43* -- 79*  CREATININE 1.78* 2.59* 2.80*  CALCIUM 8.8 -- --  MG -- -- --  PHOS -- -- --   Liver Function Tests: No results found for this basename:  AST:5,ALT:5,ALKPHOS:5,BILITOT:5,PROT:5,ALBUMIN:5 in the last 168 hours No results found for this basename: LIPASE:5,AMYLASE:5 in the last 168 hours No results found for this basename: AMMONIA:5 in the last 168 hours CBC:  Lab 05/14/11 0434 05/13/11 0625 05/13/11 0010 05/12/11 2350  WBC 8.3 7.4 -- 9.1  NEUTROABS -- -- -- --  HGB 12.2 11.7* 12.9 12.3  HCT 39.0 37.1 38.0 39.0  MCV 94.4 92.8 -- 92.6  PLT 229 204 -- 256   Cardiac Enzymes:  Lab 05/13/11 2328 05/13/11 1340 05/13/11 0625  CKTOTAL 70 71 63  CKMB 1.7 1.5 1.6  CKMBINDEX -- -- --  TROPONINI <0.30 <0.30 <0.30   BNP: No components found with this basename: POCBNP:5 CBG: No results found for this basename: GLUCAP:5 in the last 168 hours  Recent Results (from the past 240 hour(s))  MRSA PCR SCREENING     Status: Abnormal   Collection Time   05/14/11  9:10 AM      Component Value Range Status Comment   MRSA by PCR POSITIVE (*) NEGATIVE  Final      Studies: Dg Chest 2 View  05/13/2011  *RADIOLOGY REPORT*  Clinical Data: Cough and shortness of breath; hypotension.  CHEST - 2 VIEW  Comparison: Chest radiograph performed 01/13/2011, and CTA of the chest performed 01/15/2011  Findings: The lungs are well-aerated.  Mild vascular congestion is noted, without significant  pulmonary edema.  Mild right lower lobe opacity is suggested, on correlation with the lateral view; pneumonia cannot be excluded.  There is no evidence of pleural effusion or pneumothorax.  The heart is enlarged; the mediastinal contour is within normal limits.  No acute osseous abnormalities are seen.  IMPRESSION:  1.  Mild right lower lobe airspace opacity is suggested, on correlation with the lateral view; this could reflect mild pneumonia. 2.  Mild vascular congestion and cardiomegaly noted, without significant edema.  Original Report Authenticated By: Santa Lighter, M.D.   US Renal  05/13/2011  *RADIOLOGY REPORT*  Clinical Data: Renal insufficiency.  Right flank  pain.  RENAL/URINARY TRACT ULTRASOUND COMPLETE  Comparison:  None.  Findings:  Right Kidney:  Normal, measuring 10.4 cm in length.  No hydronephrosis.  Left Kidney:  Normal, measuring 10.9 cm in length.  No hydronephrosis.  Bladder:  Normal.  IMPRESSION: Normal examination.  Original Report Authenticated By: Gerald Stabs, M.D.    Scheduled Meds:   . allopurinol  100 mg Oral Daily  . amitriptyline  75 mg Oral QHS  . ammonium lactate   Topical BID  . aspirin EC  325 mg Oral Daily  . Chlorhexidine Gluconate Cloth  6 each Topical Daily  . darifenacin  7.5 mg Oral Daily  . docusate sodium  100 mg Oral BID  . enoxaparin (LOVENOX) injection  90 mg Subcutaneous Q24H  . eucerin   Topical BID  . Fluticasone-Salmeterol  1 puff Inhalation BID  . moxifloxacin  400 mg Intravenous Q24H  . mupirocin ointment  1 application Nasal BID  . pantoprazole  40 mg Oral Daily  . sodium chloride  3 mL Intravenous Q12H  . traZODone  100 mg Oral QHS  . triamcinolone ointment   Topical BID   Continuous Infusions:   . dextrose 5 % and 0.9% NaCl 75 mL/hr at 05/14/11 0352     Assessment/Plan: 1. Community acquired pneumonia-receive today's moxifloxacin, transition to by mouth moxifloxacin. Add flutter filed and guaifenesin 2. Acute kidney injury-continue IV fluids.  Reassess bmet in a.m.  likely cause = multiple blood pressure medications. Her flank pain may be related to this and she might have a stone versus altered LFTs. From a gallbladder stone. We'll repeat LFTs in the a.m. 3. COPD/asthma-continue Advair 4. Chronic pain-states she usually takes Percocet 10/325. I see no evidence of this on her home meds. However I will implement this for her.. She has chronic knee pain 5. Hypertension-low normal. Patient on clonidine only. Continue the same and not add ACE or Dyazide back 6. Chronic headaches, pain management as per above 7. Gout-continue allopurinol, consider pharmacy dose this  Code Status:  FUll  Disposition Plan: Home in one to 2 days   Verneita Griffes, MD  [facility] Pager [phone] 05/14/2011, 3:09 PM    LOS: 2 days

## 2011-05-15 LAB — BASIC METABOLIC PANEL
BUN: 24 mg/dL — ABNORMAL HIGH (ref 6–23)
Chloride: 103 mEq/L (ref 96–112)
GFR calc Af Amer: 46 mL/min — ABNORMAL LOW (ref >90)
Potassium: 4.9 mEq/L (ref 3.5–5.1)
Sodium: 138 mEq/L (ref 135–145)

## 2011-05-15 LAB — COMPREHENSIVE METABOLIC PANEL
ALT: 10 U/L (ref 0–35)
AST: 9 U/L (ref 0–37)
Albumin: 3.1 g/dL — ABNORMAL LOW (ref 3.5–5.2)
CO2: 29 mEq/L (ref 19–32)
Calcium: 8.1 mg/dL — ABNORMAL LOW (ref 8.4–10.5)
GFR calc non Af Amer: 36 mL/min — ABNORMAL LOW (ref >90)
Sodium: 139 mEq/L (ref 135–145)

## 2011-05-15 LAB — EXPECTORATED SPUTUM ASSESSMENT W GRAM STAIN, RFLX TO RESP C

## 2011-05-15 MED ORDER — MOXIFLOXACIN HCL 400 MG PO TABS
400.0000 mg | ORAL_TABLET | Freq: Every day | ORAL | Status: AC
  Administered 2011-05-15 – 2011-05-17 (×3): 400 mg via ORAL
  Filled 2011-05-15 (×3): qty 1

## 2011-05-15 MED ORDER — NAPHAZOLINE HCL 0.1 % OP SOLN
1.0000 [drp] | Freq: Four times a day (QID) | OPHTHALMIC | Status: DC | PRN
  Administered 2011-05-16: 1 [drp] via OPHTHALMIC
  Filled 2011-05-15: qty 15

## 2011-05-15 NOTE — Evaluation (Signed)
Physical Therapy Evaluation Patient Details Name: Kara Vincent MRN: [id number] DOB: 1965-07-12 Today's Date: 05/15/2011  Problem List:  Patient Active Problem List  Diagnoses  . OBSTRUCTIVE SLEEP APNEA  . HYPERTENSION  . ASTHMA  . GERD  . BOILS, RECURRENT  . OSTEOARTHRITIS, KNEES, BILATERAL  . BACK PAIN  . DARIER'S DISEASE  . INSOMNIA  . Urge incontinence  . PNA (pneumonia)  . Dehydration  . Headache    Past Medical History:  Past Medical History  Diagnosis Date  . Asthma   . GERD (gastroesophageal reflux disease)   . Hypertension   . Homelessness   . Low back pain   . 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 Tubal ligation   . Cesarean section     PT Assessment/Plan/Recommendation PT Assessment Clinical Impression Statement: Pt presents with diagnosis of pna. Pt also c/o R LE pain/numbness/tingling-MD aware. Pt will benfit from skilled PT in acute setting to maximize independence and activity tolerance in preparation for D/C home.  PT Recommendation/Assessment: Patient will need skilled PT in the acute care venue PT Problem List: Pain;Decreased mobility;Decreased activity tolerance;Decreased strength PT Therapy Diagnosis : Difficulty walking;Generalized weakness;Acute pain PT Plan PT Frequency: Min 3X/week PT Treatment/Interventions: DME instruction;Gait training;Functional mobility training;Therapeutic activities;Therapeutic exercise;Patient/family education PT Recommendation Recommendations for Other Services: OT consult (already on board) Follow Up Recommendations: Home health PT Equipment Recommended: None recommended by PT PT Goals  Acute Rehab PT Goals PT Goal Formulation: With patient Time For Goal Achievement: 7 days Pt will go Sit to Stand: with modified independence PT Goal: Sit to Stand - Progress: Goal set today Pt will Ambulate: 16 - 50 feet;with modified  independence;with least restrictive assistive device PT Goal: Ambulate - Progress: Goal set today Pt will Perform Home Exercise Program: with supervision, verbal cues required/provided PT Goal: Perform Home Exercise Program - Progress: Goal set today  PT Evaluation Precautions/Restrictions  Precautions Precautions: Fall Prior Functioning  Home Living Lives With: Friend(s) Elberta Fortis") Receives Help From: Family Type of Home: Apartment (on 6th floor) Home Layout: One level Home Access: Level entry;Elevator Home Adaptive Equipment: Bedside commode/3-in-1;Straight cane;Wheelchair - powered;Hospital bed;Shower chair with back;Grab bars in shower Additional Comments: pt states BSC is too wide to fit in bathroom Prior Function Level of Independence: Needs assistance with homemaking;Independent with homemaking with ambulation;Independent with basic ADLs Cognition Cognition Arousal/Alertness: Awake/alert Overall Cognitive Status: Appears within functional limits for tasks assessed Orientation Level: Oriented X4 Sensation/Coordination Sensation Light Touch: Impaired by gross assessment Additional Comments: pt c/o R LE numness/tingling from hip to knee (occasionally down to foot) Coordination Gross Motor Movements are Fluid and Coordinated: Yes Extremity Assessment RLE Strength RLE Overall Strength Comments: Strength > or = 3/5- no resistance applied due to pain LLE Strength LLE Overall Strength Comments: Strength > or = 4/5 with functional activity Mobility (including Balance) Bed Mobility Bed Mobility: Yes Supine to Sit: 6: Modified independent (Device/Increase time) Sit to Supine: 6: Modified independent (Device/Increase time) Transfers Transfers: Yes Sit to Stand Details (indicate cue type and reason): Min-guard assist. VCs safety.  Stand to Sit: 5: Supervision Stand to Sit Details: VCs safety.  Ambulation/Gait Ambulation/Gait: Yes Ambulation/Gait Assistance Details (indicate  cue type and reason): Min-guard assist. Pt somewhat unsteady due to R LE pain.  Ambulation Distance (Feet): 10 Feet (x 2.) Assistive device: None Gait Pattern: Antalgic;Decreased step length - left;Decreased step length - right;Decreased stride length;Step-to  pattern  Posture/Postural Control Posture/Postural Control: No significant limitations Exercise    End of Session PT - End of Session Activity Tolerance: Patient limited by pain Patient left: in bed;with call bell in reach General Behavior During Session: [facility] for tasks performed Cognition: [facility] for tasks performed  Weston Anna [facility] 05/15/2011, 11:57 AM [phone]

## 2011-05-15 NOTE — Progress Notes (Signed)
PROGRESS NOTE  Kara Vincent [id number] DOB: Apr 10, 1965 DOA: 05/12/2011 PCP: No primary provider on file.  Brief narrative: 46 year old female admitted with malaise and acute kidney injury  Past medical history: Chronic headache, labile hypertension, morbid obesity, asthma, GERD, darier disease  Consultants:  None  Procedures:  Chest x-ray 2/19 = mild right lower lobe airspace opacity suggested-possible pneumonia, mild vascular congestion cardiomegaly noted  Ultrasound 2/19 = normal  Antibiotics:  Moxifloxacin 400 mg 2/18 >>>   Subjective  Multiple subjective complaints today. Flank pain is gone now complaining of knee pain.  Also states has significant constipation. States productive sputum yellow color. States mild shortness of breath. Denies any specific chest pain. No fever.  Complains of eye itching   Objective   Interim History: Chart reviewed  Objective: Filed Vitals:   05/14/11 1300 05/14/11 2132 05/15/11 0520 05/15/11 0820  BP: 124/79 114/75 109/67   Pulse: 78 83 79   Temp: 98.1 F (36.7 C) 98.5 F (36.9 C) 98.2 F (36.8 C)   TempSrc:  Oral Oral   Resp: 12 16 17    Height:      Weight:   191.418 kg (422 lb)   SpO2: 100% 92% 98% 96%    Intake/Output Summary (Last 24 hours) at 05/15/11 1125 Last data filed at 05/15/11 1041  Gross per 24 hour  Intake   2480 ml  Output   1000 ml  Net   1480 ml    Exam:  General: Alert, obese Caucasian female Cardiovascular: S1-S2 no murmur rub or gallop Respiratory: Clinically clear no added sound Abdomen: Morbidly obese no meaningful exam appreciable   Data Reviewed: Basic Metabolic Panel:  Lab [id number] 05/14/11 0434 05/13/11 0625 05/13/11 0010  NA 139 141 -- 139  K 5.3* 5.0 -- --  CL 106 107 -- 107  CO2 29 26 -- --  GLUCOSE 117* 98 -- 95  BUN 28* 43* -- 79*  CREATININE 1.66* 1.78* 2.59* 2.80*  CALCIUM 8.1* 8.8 -- --  MG -- -- -- --  PHOS -- -- -- --   Liver Function Tests:  Lab  05/15/11 0402  AST 9  ALT 10  ALKPHOS 57  BILITOT 0.3  PROT 6.1  ALBUMIN 3.1*   No results found for this basename: LIPASE:5,AMYLASE:5 in the last 168 hours No results found for this basename: AMMONIA:5 in the last 168 hours CBC:  Lab 05/14/11 0434 05/13/11 0625 05/13/11 0010 05/12/11 2350  WBC 8.3 7.4 -- 9.1  NEUTROABS -- -- -- --  HGB 12.2 11.7* 12.9 12.3  HCT 39.0 37.1 38.0 39.0  MCV 94.4 92.8 -- 92.6  PLT 229 204 -- 256   Cardiac Enzymes:  Lab 05/13/11 2328 05/13/11 1340 05/13/11 0625  CKTOTAL 70 71 63  CKMB 1.7 1.5 1.6  CKMBINDEX -- -- --  TROPONINI <0.30 <0.30 <0.30   BNP: No components found with this basename: POCBNP:5 CBG: No results found for this basename: GLUCAP:5 in the last 168 hours  Recent Results (from the past 240 hour(s))  CULTURE, BLOOD (ROUTINE X 2)     Status: Normal (Preliminary result)   Collection Time   05/13/11  3:45 PM      Component Value Range Status Comment   Specimen Description BLOOD RIGHT ARM   Final    Special Requests BOTTLES DRAWN AEROBIC AND ANAEROBIC 2ML   Final    Culture  Setup Time [id number]   Final    Culture     Final  Value:        BLOOD CULTURE RECEIVED NO GROWTH TO DATE CULTURE WILL BE HELD FOR 5 DAYS BEFORE ISSUING A FINAL NEGATIVE REPORT   Report Status PENDING   Incomplete   CULTURE, BLOOD (ROUTINE X 2)     Status: Normal (Preliminary result)   Collection Time   05/13/11  6:30 PM      Component Value Range Status Comment   Specimen Description BLOOD LEFT ARM   Final    Special Requests BOTTLES DRAWN AEROBIC AND ANAEROBIC 3ML   Final    Culture  Setup Time [id number]   Final    Culture     Final    Value:        BLOOD CULTURE RECEIVED NO GROWTH TO DATE CULTURE WILL BE HELD FOR 5 DAYS BEFORE ISSUING A FINAL NEGATIVE REPORT   Report Status PENDING   Incomplete   MRSA PCR SCREENING     Status: Abnormal   Collection Time   05/14/11  9:10 AM      Component Value Range Status Comment   MRSA by PCR POSITIVE (*)  NEGATIVE  Final      Studies: Dg Chest 2 View  05/13/2011  *RADIOLOGY REPORT*  Clinical Data: Cough and shortness of breath; hypotension.  CHEST - 2 VIEW  Comparison: Chest radiograph performed 01/13/2011, and CTA of the chest performed 01/15/2011  Findings: The lungs are well-aerated.  Mild vascular congestion is noted, without significant pulmonary edema.  Mild right lower lobe opacity is suggested, on correlation with the lateral view; pneumonia cannot be excluded.  There is no evidence of pleural effusion or pneumothorax.  The heart is enlarged; the mediastinal contour is within normal limits.  No acute osseous abnormalities are seen.  IMPRESSION:  1.  Mild right lower lobe airspace opacity is suggested, on correlation with the lateral view; this could reflect mild pneumonia. 2.  Mild vascular congestion and cardiomegaly noted, without significant edema.  Original Report Authenticated By: Santa Lighter, M.D.   US Renal  05/13/2011  *RADIOLOGY REPORT*  Clinical Data: Renal insufficiency.  Right flank pain.  RENAL/URINARY TRACT ULTRASOUND COMPLETE  Comparison:  None.  Findings:  Right Kidney:  Normal, measuring 10.4 cm in length.  No hydronephrosis.  Left Kidney:  Normal, measuring 10.9 cm in length.  No hydronephrosis.  Bladder:  Normal.  IMPRESSION: Normal examination.  Original Report Authenticated By: Gerald Stabs, M.D.    Scheduled Meds:    . allopurinol  100 mg Oral Daily  . ALPRAZolam  1 mg Oral BID  . amitriptyline  75 mg Oral QHS  . ammonium lactate   Topical BID  . aspirin EC  325 mg Oral Daily  . Chlorhexidine Gluconate Cloth  6 each Topical Daily  . darifenacin  7.5 mg Oral Daily  . docusate sodium  100 mg Oral BID  . enoxaparin (LOVENOX) injection  90 mg Subcutaneous Q24H  . eucerin   Topical BID  . Fluticasone-Salmeterol  1 puff Inhalation BID  . guaiFENesin  600 mg Oral BID  . mupirocin ointment  1 application Nasal BID  . pantoprazole  40 mg Oral Daily  . sodium  chloride  3 mL Intravenous Q12H  . traZODone  100 mg Oral QHS  . triamcinolone ointment   Topical BID  . DISCONTD: moxifloxacin  400 mg Intravenous Q24H   Continuous Infusions:    . dextrose 5 % and 0.9% NaCl 75 mL/hr at 05/15/11 [id number]     Assessment/Plan:  1. Community acquired pneumonia-receive today's moxifloxacin, transition to by mouth moxifloxacin. Add flutter filed and guaifenesin 2. Acute kidney injury-continue IV fluids.  Reassess bmet in a.m.  likely cause = multiple blood pressure medications. . Flank pain much better. So kidney totally normal 3. ? Gallbladder pathology-LFTs within normal limits. 4. COPD/asthma-continue Advair 5. Chronic pain-states she usually takes Percocet 10/325. I see no evidence of this on her home meds. However I will implement this for her.. She has chronic knee pain.  He states that she's in pain however was somnolent this morning per her regard. She will need outpatient pain management strategies. 6. Hypertension-low normal. Patient on clonidine only. Continue the same and not add ACE or Dyazide back 7. Chronic headaches, pain management as per above 8. Gout-continue allopurinol, consider pharmacy dose this 9. Hyperkalemia-unclear etiology. Repeat labs and if repeat labs are consistent with this will give Kayexalate.  Code Status: FUll  Disposition Plan: Home in one to 2 days   Verneita Griffes, MD  [facility] Pager [phone] 05/15/2011, 11:25 AM    LOS: 3 days

## 2011-05-15 NOTE — Evaluation (Signed)
Occupational Therapy Evaluation Patient Details Name: Kara Vincent MRN: [id number] DOB: January 08, 1966 Today's Date: 05/15/2011  Problem List:  Patient Active Problem List  Diagnoses  . OBSTRUCTIVE SLEEP APNEA  . HYPERTENSION  . ASTHMA  . GERD  . BOILS, RECURRENT  . OSTEOARTHRITIS, KNEES, BILATERAL  . BACK PAIN  . DARIER'S DISEASE  . INSOMNIA  . Urge incontinence  . PNA (pneumonia)  . Dehydration  . Headache    Past Medical History:  Past Medical History  Diagnosis Date  . Asthma   . GERD (gastroesophageal reflux disease)   . Hypertension   . Homelessness   . Low back pain   . 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 Tubal ligation   . Cesarean section     OT Assessment/Plan/Recommendation OT Assessment Clinical Impression Statement: Pt will benefit from skilled OT services to increase her safety and independence with ADL for discharge home. OT Recommendation/Assessment: Patient will need skilled OT in the acute care venue OT Problem List: Decreased strength;Decreased knowledge of use of DME or AE;Pain OT Therapy Diagnosis : Generalized weakness;Acute pain OT Plan OT Frequency: Min 2X/week OT Treatment/Interventions: Self-care/ADL training;Therapeutic activities;DME and/or AE instruction;Patient/family education OT Recommendation Follow Up Recommendations: Home health OT Equipment Recommended: None recommended by OT Individuals Consulted Consulted and Agree with Results and Recommendations: Patient OT Goals Acute Rehab OT Goals OT Goal Formulation: With patient Time For Goal Achievement: 2 weeks ADL Goals Pt Will Perform Grooming: Independently;Standing at sink ADL Goal: Grooming - Progress: Goal set today Pt Will Perform Upper Body Bathing: with modified independence;Sitting, chair;Sitting, edge of bed ADL Goal: Upper Body Bathing - Progress: Goal set today Pt Will Perform Lower  Body Bathing: with modified independence;Sit to stand from chair;Sit to stand from bed ADL Goal: Lower Body Bathing - Progress: Goal set today Pt Will Perform Upper Body Dressing: with modified independence;Sitting, chair;Sitting, bed;Independently ADL Goal: Upper Body Dressing - Progress: Goal set today Pt Will Perform Lower Body Dressing: with modified independence;Sit to stand from chair;Sit to stand from bed;Other (comment);Independently (AD PRN) ADL Goal: Lower Body Dressing - Progress: Goal set today Pt Will Transfer to Toilet: with modified independence;3-in-1;Ambulation ADL Goal: Toilet Transfer - Progress: Goal set today Pt Will Perform Toileting - Clothing Manipulation: with modified independence;Standing ADL Goal: Toileting - Clothing Manipulation - Progress: Goal set today Pt Will Perform Toileting - Hygiene: with modified independence;Sit to stand from 3-in-1/toilet ADL Goal: Toileting - Hygiene - Progress: Goal set today Pt Will Perform Tub/Shower Transfer: Tub transfer;with supervision;Shower seat without back ADL Goal: Clinical cytogeneticist - Progress: Goal set today  OT Evaluation Precautions/Restrictions  Precautions Precautions: Fall Restrictions Weight Bearing Restrictions: No Prior Functioning Home Living Lives With: Friend(s) Elberta Fortis") Receives Help From: Family Type of Home: Apartment (on 6th floor) Home Layout: One level Home Access: Level entry;Elevator Bathroom Shower/Tub: Chiropodist: Standard Home Adaptive Equipment: Bedside commode/3-in-1;Straight cane;Wheelchair - powered;Hospital bed;Shower chair with back;Grab bars in shower Additional Comments: pt states BSC is too wide to fit in bathroom Prior Function Level of Independence: Needs assistance with homemaking;Independent with homemaking with ambulation;Independent with basic ADLs ADL ADL Eating/Feeding: Simulated;Independent Where Assessed - Eating/Feeding: Edge of  bed Grooming: Performed;Wash/dry hands;Minimal assistance Grooming Details (indicate cue type and reason): min guard assist for safety due to significant R LE pain and slightly unsteady Where Assessed - Grooming: Standing at sink Upper Body Bathing: Simulated;Chest;Right  arm;Left arm;Abdomen;Minimal assistance Upper Body Bathing Details (indicate cue type and reason): for thoroughness and to manage lines, gown Where Assessed - Upper Body Bathing: Sitting, bed;Unsupported Lower Body Bathing: Simulated;Minimal assistance Lower Body Bathing Details (indicate cue type and reason): able to side sit on the bed and pull legs up to simulate washing feet. min assist thoroughness with periareas Where Assessed - Lower Body Bathing: Sit to stand from bed Upper Body Dressing: Simulated;Minimal assistance Upper Body Dressing Details (indicate cue type and reason): with gown Where Assessed - Upper Body Dressing: Sitting, bed;Unsupported Lower Body Dressing: Simulated;Minimal assistance Where Assessed - Lower Body Dressing: Sit to stand from bed Toilet Transfer: Performed;Minimal assistance Toilet Transfer Details (indicate cue type and reason): min guard assist without device for safety. pt slightly unsteady Toilet Transfer Method: Counselling psychologist: Extra wide bedside commode Toileting - Clothing Manipulation: Minimal assistance;Performed Toileting - Clothing Manipulation Details (indicate cue type and reason): min guard assist with underwear Where Assessed - Toileting Clothing Manipulation: Sit to stand from 3-in-1 or toilet Toileting - Hygiene: Performed;Minimal assistance Toileting - Hygiene Details (indicate cue type and reason): min guard assist Where Assessed - Toileting Hygiene: Sit to stand from 3-in-1 or toilet Tub/Shower Transfer: Not assessed Tub/Shower Transfer Method: Not assessed ADL Comments: Pt states she is interested in a reacher as she gets dizzy when she bends over  to pick up things off the floor at home. Pt states she has Medicaid.  Vision/Perception  Vision - History Baseline Vision: No visual deficits Cognition Cognition Arousal/Alertness: Awake/alert Overall Cognitive Status: Appears within functional limits for tasks assessed Orientation Level: Oriented X4 Sensation/Coordination Sensation Light Touch: Impaired by gross assessment Additional Comments: pt c/o R LE numness/tingling from hip to knee (occasionally down to foot) Coordination Gross Motor Movements are Fluid and Coordinated: Yes Extremity Assessment RUE Assessment RUE Assessment: Within Functional Limits LUE Assessment LUE Assessment: Within Functional Limits Mobility  Bed Mobility Bed Mobility: Yes Supine to Sit: 6: Modified independent (Device/Increase time) Sit to Supine: 6: Modified independent (Device/Increase time) Transfers Sit to Stand Details (indicate cue type and reason): Min-guard assist. VCs safety.  Stand to Sit: 5: Supervision Stand to Sit Details: VCs safety.  Exercises   End of Session OT - End of Session Activity Tolerance: Patient tolerated treatment well Patient left: in bed;with call bell in reach General Behavior During Session: [facility] for tasks performed Cognition: [facility] for tasks performed   Jules Schick [id number] 05/15/2011, 12:13 PM

## 2011-05-16 ENCOUNTER — Inpatient Hospital Stay (HOSPITAL_COMMUNITY): Payer: Medicaid Other

## 2011-05-16 ENCOUNTER — Other Ambulatory Visit: Payer: Self-pay

## 2011-05-16 ENCOUNTER — Other Ambulatory Visit (HOSPITAL_COMMUNITY): Payer: Medicaid Other

## 2011-05-16 LAB — BASIC METABOLIC PANEL
BUN: 19 mg/dL (ref 6–23)
Chloride: 103 mEq/L (ref 96–112)
Creatinine, Ser: 1.59 mg/dL — ABNORMAL HIGH (ref 0.50–1.10)
GFR calc Af Amer: 48 mL/min — ABNORMAL LOW (ref >90)
GFR calc non Af Amer: 38 mL/min — ABNORMAL LOW (ref >90)
GFR calc non Af Amer: 41 mL/min — ABNORMAL LOW (ref >90)
Glucose, Bld: 107 mg/dL — ABNORMAL HIGH (ref 70–99)
Potassium: 5.9 mEq/L — ABNORMAL HIGH (ref 3.5–5.1)
Potassium: 4.9 mEq/L (ref 3.5–5.1)
Sodium: 136 mEq/L (ref 135–145)

## 2011-05-16 LAB — CARDIAC PANEL(CRET KIN+CKTOT+MB+TROPI)
CK, MB: 1.6 ng/mL (ref 0.3–4.0)
Troponin I: <0.3 ng/mL (ref <0.30)

## 2011-05-16 MED ORDER — SODIUM POLYSTYRENE SULFONATE 15 GM/60ML PO SUSP
15.0000 g | Freq: Once | ORAL | Status: AC
  Administered 2011-05-16: 15 g via ORAL
  Filled 2011-05-16: qty 60

## 2011-05-16 MED ORDER — SODIUM CHLORIDE 0.9 % IV SOLN
1.0000 g | Freq: Once | INTRAVENOUS | Status: AC
  Administered 2011-05-16: 1 g via INTRAVENOUS
  Filled 2011-05-16: qty 10

## 2011-05-16 MED ORDER — MICONAZOLE NITRATE 2 % EX CREA
1.0000 "application " | TOPICAL_CREAM | Freq: Two times a day (BID) | CUTANEOUS | Status: DC
  Administered 2011-05-17 – 2011-05-19 (×4): 1 via TOPICAL
  Filled 2011-05-16 (×3): qty 14

## 2011-05-16 NOTE — Progress Notes (Signed)
PROGRESS NOTE  Kara Vincent [id number] DOB: September 22, 1965 DOA: 05/12/2011 PCP: No primary provider on file.  Brief narrative: 46 year old female admitted with malaise and acute kidney injury  Past medical history: Chronic headache, labile hypertension, morbid obesity, asthma, GERD, darier disease  Consultants:  None  Procedures:  Chest x-ray 2/19 = mild right lower lobe airspace opacity suggested-possible pneumonia, mild vascular congestion cardiomegaly noted  Ultrasound 2/19 = normal  Antibiotics:  Moxifloxacin 400 mg 2/18 >>>   Subjective  Multiple subjective complaints today. Flank pain is gone now complaining of knee pain, radiating from lower lumbar area Also states has significant constipation. States productive sputum yellow colo-wants eye drops. States mild shortness of breath. Denies any specific chest pain. No fever.  Complains of eye itching   Objective   Interim History: Chart reviewed  Objective: Filed Vitals:   05/16/11 0830 05/16/11 1240 05/16/11 1252 05/16/11 1430  BP:  87/58 125/90 103/53  Pulse:  85  96  Temp:  99.4 F (37.4 C)  98.5 F (36.9 C)  TempSrc:  Oral    Resp:  18  22  Height:      Weight:      SpO2: 94% 93%  94%    Intake/Output Summary (Last 24 hours) at 05/16/11 1542 Last data filed at 05/16/11 1200  Gross per 24 hour  Intake    480 ml  Output   1950 ml  Net  -1470 ml    Exam:  General: Alert, obese Caucasian female Cardiovascular: S1-S2 no murmur rub or gallop Respiratory: Clinically clear no added sound Abdomen: Morbidly obese no meaningful exam appreciable Skin-has a papular fine rash over upper back, some areas appearing pustular   Data Reviewed: Basic Metabolic Panel:  Lab [id number] 05/15/11 1200 05/15/11 0402 05/14/11 0434 05/13/11 0625 05/13/11 0010  NA 139 138 139 141 -- 139  K 5.9* 4.9 -- -- -- --  CL 103 103 106 107 -- 107  CO2 32 29 29 26  -- --  GLUCOSE 107* 119* 117* 98 -- 95  BUN 19 24* 28*  43* -- 79*  CREATININE 1.59* 1.52* 1.66* 1.78* 2.59* --  CALCIUM 8.1* 8.2* 8.1* 8.8 -- --  MG -- -- -- -- -- --  PHOS -- -- -- -- -- --   Liver Function Tests:  Lab 05/15/11 0402  AST 9  ALT 10  ALKPHOS 57  BILITOT 0.3  PROT 6.1  ALBUMIN 3.1*   No results found for this basename: LIPASE:5,AMYLASE:5 in the last 168 hours No results found for this basename: AMMONIA:5 in the last 168 hours CBC:  Lab 05/14/11 0434 05/13/11 0625 05/13/11 0010 05/12/11 2350  WBC 8.3 7.4 -- 9.1  NEUTROABS -- -- -- --  HGB 12.2 11.7* 12.9 12.3  HCT 39.0 37.1 38.0 39.0  MCV 94.4 92.8 -- 92.6  PLT 229 204 -- 256   Cardiac Enzymes:  Lab 05/13/11 2328 05/13/11 1340 05/13/11 0625  CKTOTAL 70 71 63  CKMB 1.7 1.5 1.6  CKMBINDEX -- -- --  TROPONINI <0.30 <0.30 <0.30   BNP: No components found with this basename: POCBNP:5 CBG: No results found for this basename: GLUCAP:5 in the last 168 hours  Recent Results (from the past 240 hour(s))  CULTURE, BLOOD (ROUTINE X 2)     Status: Normal (Preliminary result)   Collection Time   05/13/11  3:45 PM      Component Value Range Status Comment   Specimen Description BLOOD RIGHT ARM   Final  Special Requests BOTTLES DRAWN AEROBIC AND ANAEROBIC 2ML   Final    Culture  Setup Time [id number]   Final    Culture     Final    Value:        BLOOD CULTURE RECEIVED NO GROWTH TO DATE CULTURE WILL BE HELD FOR 5 DAYS BEFORE ISSUING A FINAL NEGATIVE REPORT   Report Status PENDING   Incomplete   CULTURE, BLOOD (ROUTINE X 2)     Status: Normal (Preliminary result)   Collection Time   05/13/11  6:30 PM      Component Value Range Status Comment   Specimen Description BLOOD LEFT ARM   Final    Special Requests BOTTLES DRAWN AEROBIC AND ANAEROBIC 3ML   Final    Culture  Setup Time [id number]   Final    Culture     Final    Value:        BLOOD CULTURE RECEIVED NO GROWTH TO DATE CULTURE WILL BE HELD FOR 5 DAYS BEFORE ISSUING A FINAL NEGATIVE REPORT   Report Status  PENDING   Incomplete   MRSA PCR SCREENING     Status: Abnormal   Collection Time   05/14/11  9:10 AM      Component Value Range Status Comment   MRSA by PCR POSITIVE (*) NEGATIVE  Final   CULTURE, RESPIRATORY     Status: Normal (Preliminary result)   Collection Time   05/15/11  7:47 PM      Component Value Range Status Comment   Specimen Description SPUTUM   Final    Special Requests NONE   Final    Gram Stain     Final    Value: NO WBC SEEN     NO SQUAMOUS EPITHELIAL CELLS SEEN     RARE GRAM POSITIVE RODS     FEW GRAM NEGATIVE RODS   Culture PENDING   Incomplete    Report Status PENDING   Incomplete   CULTURE, SPUTUM-ASSESSMENT     Status: Normal   Collection Time   05/15/11  9:20 PM      Component Value Range Status Comment   Specimen Description SPUTUM   Final    Special Requests Normal   Final    Sputum evaluation     Final    Value: THIS SPECIMEN IS ACCEPTABLE. RESPIRATORY CULTURE REPORT TO FOLLOW.   Report Status 05/15/2011 FINAL   Final      Studies: Dg Chest 2 View  05/13/2011  *RADIOLOGY REPORT*  Clinical Data: Cough and shortness of breath; hypotension.  CHEST - 2 VIEW  Comparison: Chest radiograph performed 01/13/2011, and CTA of the chest performed 01/15/2011  Findings: The lungs are well-aerated.  Mild vascular congestion is noted, without significant pulmonary edema.  Mild right lower lobe opacity is suggested, on correlation with the lateral view; pneumonia cannot be excluded.  There is no evidence of pleural effusion or pneumothorax.  The heart is enlarged; the mediastinal contour is within normal limits.  No acute osseous abnormalities are seen.  IMPRESSION:  1.  Mild right lower lobe airspace opacity is suggested, on correlation with the lateral view; this could reflect mild pneumonia. 2.  Mild vascular congestion and cardiomegaly noted, without significant edema.  Original Report Authenticated By: Santa Lighter, M.D.   US Renal  05/13/2011  *RADIOLOGY REPORT*   Clinical Data: Renal insufficiency.  Right flank pain.  RENAL/URINARY TRACT ULTRASOUND COMPLETE  Comparison:  None.  Findings:  Right Kidney:  Normal, measuring  10.4 cm in length.  No hydronephrosis.  Left Kidney:  Normal, measuring 10.9 cm in length.  No hydronephrosis.  Bladder:  Normal.  IMPRESSION: Normal examination.  Original Report Authenticated By: Gerald Stabs, M.D.    Scheduled Meds:    . allopurinol  100 mg Oral Daily  . ALPRAZolam  1 mg Oral BID  . amitriptyline  75 mg Oral QHS  . ammonium lactate   Topical BID  . aspirin EC  325 mg Oral Daily  . calcium gluconate  1 g Intravenous Once  . Chlorhexidine Gluconate Cloth  6 each Topical Daily  . darifenacin  7.5 mg Oral Daily  . docusate sodium  100 mg Oral BID  . enoxaparin (LOVENOX) injection  90 mg Subcutaneous Q24H  . eucerin   Topical BID  . Fluticasone-Salmeterol  1 puff Inhalation BID  . guaiFENesin  600 mg Oral BID  . moxifloxacin  400 mg Oral q1800  . mupirocin ointment  1 application Nasal BID  . pantoprazole  40 mg Oral Daily  . sodium chloride  3 mL Intravenous Q12H  . sodium polystyrene  15 g Oral Once  . traZODone  100 mg Oral QHS  . triamcinolone ointment   Topical BID   Continuous Infusions:    . dextrose 5 % and 0.9% NaCl 75 mL/hr at 05/15/11 1811     Assessment/Plan: 1. Community acquired pneumonia-receive moxifloxacin IV 2/17>>, transition to by mouth moxifloxacin. Add flutter and guaifenesin 2. Acute kidney injury-continue IV fluids.  Reassess bmet shows improved B. and creatinine however persisting hypokalemia.  likely cause = multiple blood pressure medications. Flank pain much better. And unlikely to be urinary calculus pain 3. ? Gallbladder pathology-LFTs within normal limits. 4. COPD/asthma-continue Advair, albuterol 5. Chronic pain-states she usually takes Percocet 10/325. I see no evidence of this on her home meds since she has significant constipation we'll cut back to 7.5  She will need  outpatient pain management strategies. He would get an x-ray of the hip and lower back since she complains of continued pain 6. Hypertension-low normal. Patient on clonidine only. Continue the same and not add ACE or Thiazide back 7. Chronic headaches, pain management as per above 8. Gout-continue allopurinol, consider pharmacy dose this in view of renal insufficiency however this is improving 9. Hyperkalemia-unclear etiology. She had an EKG done this morning showing no specific T wave elevations, I suspect this is likely pseudohyperkalemia, she's not on any offending medications that can cause this issue. She received one dose of Kayexalate and calcium gluconate and I will repeat a be met every 12 hourly given specific instructions not to this tourniquet or clenched fist for venipuncture in transport the bottle downstairs by hand to the lab.  We'll also get a CK level to rule out rhabdomyolysis. If this persists I will consult nephrology. 10. Eczema -continue Eucerin lotion, Neosporin ointment 2%, triamcinolone cream to back  Code Status: FUll  Disposition Plan: Home in one to 2 days   Verneita Griffes, MD  [facility] Pager [phone] 05/16/2011, 3:42 PM    LOS: 4 days

## 2011-05-17 LAB — BASIC METABOLIC PANEL
BUN: 16 mg/dL (ref 6–23)
Calcium: 8.2 mg/dL — ABNORMAL LOW (ref 8.4–10.5)
Creatinine, Ser: 1.29 mg/dL — ABNORMAL HIGH (ref 0.50–1.10)
GFR calc Af Amer: 56 mL/min — ABNORMAL LOW (ref >90)
GFR calc non Af Amer: 49 mL/min — ABNORMAL LOW (ref >90)

## 2011-05-17 NOTE — Progress Notes (Signed)
Approx. 1528 pt called out for help. CNA entered room, with pt observed sitting on floor in front of bedside commode. Underpants wet, with urine on floor. Alert, oriented, denying pain or discomfort.  Other staff summoned by CNA to get pt up; however, pt pulled self back to standing position on own using siderail of bed.  Vs taken  98-89-20 134/75  02 sat 95% on 2l via n/c.  Pt got back to bed.  Stated felt a little "woozy" after she went to bathroom.  Stated she actually sat down on floor.  Dr. Verlon Au on unit. Informed of pt with "fall". Went in to see pt. Recommended ambulate today to see how she tollerates. CM ordered for transportation needs when ready for d/c.  Pt checked often after fall, with no significant changes in initial assessment for this shift.  Continues to deny discomfort, concerns. No deformity of extremities, joints ntd.  Will cont to monitor with any new concerns, changes ntd,.

## 2011-05-17 NOTE — Progress Notes (Signed)
PROGRESS NOTE  Kara Vincent [id number] DOB: 1966/02/14 DOA: 05/12/2011 PCP: No primary provider on file.  Brief narrative: 46 year old female admitted with malaise and acute kidney injury  Past medical history: Chronic headache, labile hypertension, morbid obesity, asthma, GERD, darier disease  Consultants:  None  Procedures:  Chest x-ray 2/19 = mild right lower lobe airspace opacity suggested-possible pneumonia, mild vascular congestion cardiomegaly noted  Ultrasound 2/19 = normal  CT scan lower back 2/22 = severe L4 L5 facet degeneration contributing to bilateral foraminal stenosis, severe L5-L1 disc degeneration picture bridging to bilateral from last  CT pelvis 2/12 = no fracture syndrome pelvis, Ma changes and sacroiliac joints, degenerative disc disease and spondylolisthesis L5-S1 and facet disease at same  Antibiotics:  Moxifloxacin 400 mg 2/18 >>>   Subjective  Multiple subjective complaints today. Has mild headache. [place] today on ground and going to the restroom-did not sustain any specific injury . States mild shortness of breath. Denies any specific chest pain. No fever.     Objective   Interim History: Chart reviewed  Objective: Filed Vitals:   05/17/11 0513 05/17/11 1154 05/17/11 1459 05/17/11 1528  BP: 102/65  124/68 134/75  Pulse: 84  83 89  Temp: 98.2 F (36.8 C)  98 F (36.7 C) 98 F (36.7 C)  TempSrc: Oral  Oral Oral  Resp: 20  20 20   Height:      Weight:      SpO2: 96% 94% 96% 95%    Intake/Output Summary (Last 24 hours) at 05/17/11 1550 Last data filed at 05/17/11 1500  Gross per 24 hour  Intake    240 ml  Output   1150 ml  Net   -910 ml    Exam:  General: Alert, obese Caucasian female Cardiovascular: S1-S2 no murmur rub or gallop Respiratory: Clinically clear no added sound Abdomen: Morbidly obese no meaningful exam appreciable Skin-has a papular fine rash over upper back, some areas appearing pustular   Data  Reviewed: Basic Metabolic Panel:  Lab [id number] 05/16/11 1645 05/16/11 0405 05/15/11 1200 05/15/11 0402  NA 137 136 139 138 139  K 4.5 4.9 -- -- --  CL 101 101 103 103 106  CO2 33* 30 32 29 29  GLUCOSE 104* 110* 107* 119* 117*  BUN 16 17 19  24* 28*  CREATININE 1.29* 1.48* 1.59* 1.52* 1.66*  CALCIUM 8.2* 8.3* 8.1* 8.2* 8.1*  MG -- -- -- -- --  PHOS -- -- -- -- --   Liver Function Tests:  Lab 05/15/11 0402  AST 9  ALT 10  ALKPHOS 57  BILITOT 0.3  PROT 6.1  ALBUMIN 3.1*   No results found for this basename: LIPASE:5,AMYLASE:5 in the last 168 hours No results found for this basename: AMMONIA:5 in the last 168 hours CBC:  Lab 05/14/11 0434 05/13/11 0625 05/13/11 0010 05/12/11 2350  WBC 8.3 7.4 -- 9.1  NEUTROABS -- -- -- --  HGB 12.2 11.7* 12.9 12.3  HCT 39.0 37.1 38.0 39.0  MCV 94.4 92.8 -- 92.6  PLT 229 204 -- 256   Cardiac Enzymes:  Lab 05/16/11 1645 05/13/11 2328 05/13/11 1340 05/13/11 0625  CKTOTAL 63 70 71 63  CKMB 1.6 1.7 1.5 1.6  CKMBINDEX -- -- -- --  TROPONINI <0.30 <0.30 <0.30 <0.30   BNP: No components found with this basename: POCBNP:5 CBG: No results found for this basename: GLUCAP:5 in the last 168 hours  Recent Results (from the past 240 hour(s))  CULTURE, BLOOD (ROUTINE X 2)  Status: Normal (Preliminary result)   Collection Time   05/13/11  3:45 PM      Component Value Range Status Comment   Specimen Description BLOOD RIGHT ARM   Final    Special Requests BOTTLES DRAWN AEROBIC AND ANAEROBIC 2ML   Final    Culture  Setup Time [id number]   Final    Culture     Final    Value:        BLOOD CULTURE RECEIVED NO GROWTH TO DATE CULTURE WILL BE HELD FOR 5 DAYS BEFORE ISSUING A FINAL NEGATIVE REPORT   Report Status PENDING   Incomplete   CULTURE, BLOOD (ROUTINE X 2)     Status: Normal (Preliminary result)   Collection Time   05/13/11  6:30 PM      Component Value Range Status Comment   Specimen Description BLOOD LEFT ARM   Final    Special  Requests BOTTLES DRAWN AEROBIC AND ANAEROBIC 3ML   Final    Culture  Setup Time [id number]   Final    Culture     Final    Value:        BLOOD CULTURE RECEIVED NO GROWTH TO DATE CULTURE WILL BE HELD FOR 5 DAYS BEFORE ISSUING A FINAL NEGATIVE REPORT   Report Status PENDING   Incomplete   MRSA PCR SCREENING     Status: Abnormal   Collection Time   05/14/11  9:10 AM      Component Value Range Status Comment   MRSA by PCR POSITIVE (*) NEGATIVE  Final   CULTURE, RESPIRATORY     Status: Normal (Preliminary result)   Collection Time   05/15/11  7:47 PM      Component Value Range Status Comment   Specimen Description SPUTUM   Final    Special Requests NONE   Final    Gram Stain     Final    Value: NO WBC SEEN     NO SQUAMOUS EPITHELIAL CELLS SEEN     RARE GRAM POSITIVE RODS     FEW GRAM NEGATIVE RODS   Culture NORMAL OROPHARYNGEAL FLORA   Final    Report Status PENDING   Incomplete   CULTURE, SPUTUM-ASSESSMENT     Status: Normal   Collection Time   05/15/11  9:20 PM      Component Value Range Status Comment   Specimen Description SPUTUM   Final    Special Requests Normal   Final    Sputum evaluation     Final    Value: THIS SPECIMEN IS ACCEPTABLE. RESPIRATORY CULTURE REPORT TO FOLLOW.   Report Status 05/15/2011 FINAL   Final      Studies: Dg Chest 2 View  05/13/2011  *RADIOLOGY REPORT*  Clinical Data: Cough and shortness of breath; hypotension.  CHEST - 2 VIEW  Comparison: Chest radiograph performed 01/13/2011, and CTA of the chest performed 01/15/2011  Findings: The lungs are well-aerated.  Mild vascular congestion is noted, without significant pulmonary edema.  Mild right lower lobe opacity is suggested, on correlation with the lateral view; pneumonia cannot be excluded.  There is no evidence of pleural effusion or pneumothorax.  The heart is enlarged; the mediastinal contour is within normal limits.  No acute osseous abnormalities are seen.  IMPRESSION:  1.  Mild right lower lobe  airspace opacity is suggested, on correlation with the lateral view; this could reflect mild pneumonia. 2.  Mild vascular congestion and cardiomegaly noted, without significant edema.  Original Report Authenticated  By: JEFFREY Radene Knee, M.D.   US Renal  05/13/2011  *RADIOLOGY REPORT*  Clinical Data: Renal insufficiency.  Right flank pain.  RENAL/URINARY TRACT ULTRASOUND COMPLETE  Comparison:  None.  Findings:  Right Kidney:  Normal, measuring 10.4 cm in length.  No hydronephrosis.  Left Kidney:  Normal, measuring 10.9 cm in length.  No hydronephrosis.  Bladder:  Normal.  IMPRESSION: Normal examination.  Original Report Authenticated By: Gerald Stabs, M.D.    Scheduled Meds:    . allopurinol  100 mg Oral Daily  . ALPRAZolam  1 mg Oral BID  . amitriptyline  75 mg Oral QHS  . ammonium lactate   Topical BID  . aspirin EC  325 mg Oral Daily  . Chlorhexidine Gluconate Cloth  6 each Topical Daily  . darifenacin  7.5 mg Oral Daily  . docusate sodium  100 mg Oral BID  . enoxaparin (LOVENOX) injection  90 mg Subcutaneous Q24H  . eucerin   Topical BID  . Fluticasone-Salmeterol  1 puff Inhalation BID  . guaiFENesin  600 mg Oral BID  . miconazole  1 application Topical BID  . moxifloxacin  400 mg Oral q1800  . mupirocin ointment  1 application Nasal BID  . pantoprazole  40 mg Oral Daily  . sodium chloride  3 mL Intravenous Q12H  . traZODone  100 mg Oral QHS  . triamcinolone ointment   Topical BID   Continuous Infusions:     Assessment/Plan: 1. Community acquired pneumonia-receive moxifloxacin IV 2/17>>, transition to by mouth moxifloxacin. To complete antibiotics course 2/24 2. Acute kidney injury-continue IV fluids.  Reassess bmet shows improved B. and creatinine however persisting hypokalemia.  likely cause = multiple blood pressure medications.  3. ? Gallbladder pathology-LFTs within normal limits. 4. COPD/asthma-continue Advair, albuterol 5. Chronic pain-states she usually takes Percocet  10/325. I see no evidence of this on her home meds since she has significant constipation we'll cut back to 7.5  She will need outpatient pain management strategies. CT lower back shows severe degenerative disc disease, she will need aggressive pain management and weight loss strategies to be done with her outpatient physician who she saw 3 weeks 6. Hypertension-low normal. Patient on clonidine only. Continue the same and not add ACE or Thiazide back 7. Chronic headaches, pain management as per above-has trialed medications in the outpatient setting for this. Would recommend following with outpatient physician for the same 8. Gout-continue allopurinol, consider pharmacy dose this in view of renal insufficiency however this is improving 9. Hyperkalemia-unclear etiology. She had an EKG done this morning showing no specific T wave elevations, I suspect this is likely pseudohyperkalemia, CPK level negative. Repeat potassium today 4.5. Likely in keeping with pseudohyperkalemia. Recommend outpatient testing 10. Eczema -continue Eucerin lotion, Neosporin ointment 2%, triamcinolone cream to back  Code Status: FUll  Disposition Plan: Home in one to 2 days   Verneita Griffes, MD  [facility] Pager [phone] 05/17/2011, 3:50 PM    LOS: 5 days

## 2011-05-17 NOTE — Progress Notes (Signed)
All 4 side rails up per pt request.

## 2011-05-18 LAB — CULTURE, RESPIRATORY W GRAM STAIN: Culture: NORMAL

## 2011-05-18 MED ORDER — NAPHAZOLINE HCL 0.1 % OP SOLN: 1.0000 [drp] | Freq: Four times a day (QID) | OPHTHALMIC | Status: AC | PRN

## 2011-05-18 MED ORDER — LACTULOSE 10 GM/15ML PO SOLN
20.0000 g | Freq: Two times a day (BID) | ORAL | Status: DC | PRN
  Administered 2011-05-18: 20 g via ORAL
  Filled 2011-05-18: qty 30

## 2011-05-18 MED ORDER — GUAIFENESIN ER 600 MG PO TB12: 600.0000 mg | ORAL_TABLET | Freq: Two times a day (BID) | ORAL | Status: DC

## 2011-05-18 MED ORDER — ALBUTEROL SULFATE (5 MG/ML) 0.5% IN NEBU
2.5000 mg | INHALATION_SOLUTION | Freq: Four times a day (QID) | RESPIRATORY_TRACT | Status: DC
  Administered 2011-05-18 – 2011-05-19 (×5): 2.5 mg via RESPIRATORY_TRACT
  Filled 2011-05-18 (×6): qty 0.5

## 2011-05-18 MED ORDER — MICONAZOLE NITRATE 2 % EX CREA: 1.0000 "application " | TOPICAL_CREAM | Freq: Two times a day (BID) | CUTANEOUS | Status: DC

## 2011-05-18 MED ORDER — OXYCODONE-ACETAMINOPHEN 5-325 MG PO TABS: 2.0000 | ORAL_TABLET | Freq: Four times a day (QID) | ORAL | Status: AC | PRN

## 2011-05-18 NOTE — Discharge Summary (Signed)
[facility] Discharge Summary  Recommendations on discharge  -Need a Bmet without tourniquet, in one week. If potassium high, recommend working up for secondary hyperaldosteronism -Consider bariatric center referral. -Review rash on back, might need dermatology referral -Reassess chronic pain-discharged with small quantities of opiates. -Consider implementation of a trip 10, discussion with the neurologist if not already done for chronic headaches  Kara Vincent [id number] DOB: [birth year] DOA: 05/12/2011 PCP: No primary provider on file.  Date of Admission: 05/12/2011  7:47 PM Admitter: @ADMITPROV @   Date of Discharge2/24/2013 Attending Physician: Verneita Griffes, MD  Brief narrative: 46 year old female admitted with malaise and acute kidney injury-also has a history of chronic headache for which she usually takes Percocet and daily basis, labile hypertension super morbid obesity with a BMI over 40 isn't emergency room hypotensive with systolic blood pressure into the 90s and her initial chest x-ray showed possible pneumonia at presentation. She had no leukocytosis and no fever and has not been feeling well. She felt lightheaded at that time and denied any cough fever chills to contact her distant travel.  Past medical history: Chronic headache, labile hypertension, morbid obesity, asthma, GERD, darier disease  Consultants:  None  Procedures:  Chest x-ray 2/19 = mild right lower lobe airspace opacity suggested-possible pneumonia, mild vascular congestion cardiomegaly noted   Ultrasound kidney 2/19 = normal  CT scan lower back 2/20 = severe L4-L5 facet degeneration contributing to bilateral foraminal stenosis, severe L5/S1 disc degeneration   CT pelvis 2/20 = no fracture. Mild changes to sacroiliac joints joint disease and spondylolisthesis L5-S1 facet disease at same level  Antibiotics:  Moxifloxacin 400 mg 2/18 >>>2/24  Hospital Course by problem  list: Assessment/Plan: 1. Community acquired pneumonia-receive moxifloxacin 2/17>> 2/24 c/o of cough which I believe is post-pneumonic. She will be discharged home on Mucinex.   2. Acute kidney injury-continue IV fluids.was noted to see if that she has been on lisinopril and diuretic. Her initial metabolic panel showed a BUN to creatinine ratio of 43/1.78. With IV fluid hydration trended down to 16/1.29 on 2/23.  She initially presented with mild to moderate elevation also of her potassium-see below. 3. Abdominal pain-initial presentation revealed that she had some significant abdominal pain and such LFTs were done. It was thought that this could be liver pathology versus renal given her increased in BUN/creatinine ratio. Patient ultimately had less abdominal pain and obstructive nephrolithiasis or gallbladder pathology was soft to be a distant possibility. Rather it was thought this more acute with her chronic pain syndrome.-See below 4. COPD/asthma-continue Advair, albuterol-he does use nebulizations and inhalers at home. She also does have obstructive sleep apnea 5. Sleep apnea-she was on oxygen while here in the hospital. She will continue on CPAP at home settings. 6. Chronic pain-states she usually takes Percocet 10/325.  She will need outpatient pain management strategies. CT lower back shows severe degenerative disc disease, she will need aggressive pain management and weight loss strategies to be done with her outpatient physician who she saw 3 weeks ago-I would recommend pain management consult 7. Super morbid obesity-patient's BMI is well above 40 or even 45 and she is about 427 pounds. She has a Theme park manager wheelchair and used to use a cane in the past. I believe to be her to her detriment to continue using the mechanized scooter. I have asked physical therapy to reassess the patient today for possible DME needs. I would also recommend following up with bariatric center here at [facility] long  for  further needs for possible bariatric surgery. I suspect that this would likely help a bunch of her issues including her restrictive lung disease, her hypertension, her chronic headaches and more importantly decrease her weight to a level that is manageable and acceptable by her. 8. Hypotension-resolved. Patient on clonidine only. Continue the same and not add ACE or Thiazide back-will need outpatient reevaluation 9. Chronic headaches, pain management as per above-has trialed medications in the outpatient setting for this. Would recommend following with outpatient physician for the same  10. Gout-continue allopurinol 11. Hyperkalemia-unclear etiology. It was thought that she had a possible pseudohyperkalemia, and she did receive one dose of Kayexalate. EKG done showedno specific T wave elevations, I suspect this is likely pseudohyperkalemia, CPK level negative. Repeat potassium today 4.5. Likely in keeping with pseudohyperkalemia. Recommend outpatient re-testing 12. Eczema -continue Eucerin lotion, Miconazole ointment 2%, triamcinolone cream to back-will need outpatient followup for this   LOS: 6 days   Procedures Performed and pertinent labs: Dg Chest 2 View  05/16/2011  *RADIOLOGY REPORT*  Clinical Data: Evaluate pneumonia  CHEST - 2 VIEW  Comparison: 05/13/2011  Findings: Heart size appears enlarged.  The lung volumes are low and there is asymmetric elevation of the right hemidiaphragm.  Atelectasis is noted within the lung bases.  IMPRESSION:  1.  Low lung volumes and bibasilar atelectasis.  Original Report Authenticated By: Angelita Ingles, M.D.   Dg Chest 2 View  05/13/2011  *RADIOLOGY REPORT*  Clinical Data: Cough and shortness of breath; hypotension.  CHEST - 2 VIEW  Comparison: Chest radiograph performed 01/13/2011, and CTA of the chest performed 01/15/2011  Findings: The lungs are well-aerated.  Mild vascular congestion is noted, without significant pulmonary edema.  Mild right lower lobe  opacity is suggested, on correlation with the lateral view; pneumonia cannot be excluded.  There is no evidence of pleural effusion or pneumothorax.  The heart is enlarged; the mediastinal contour is within normal limits.  No acute osseous abnormalities are seen.  IMPRESSION:  1.  Mild right lower lobe airspace opacity is suggested, on correlation with the lateral view; this could reflect mild pneumonia. 2.  Mild vascular congestion and cardiomegaly noted, without significant edema.  Original Report Authenticated By: Santa Lighter, M.D.   Ct Lumbar Spine Wo Contrast  05/16/2011  *RADIOLOGY REPORT*  Clinical Data: 46 year old female with low back pain radiating down the left lower extremity.  CT LUMBAR SPINE WITHOUT CONTRAST  Technique:  Multidetector CT imaging of the lumbar spine was performed without intravenous contrast administration. Multiplanar CT image reconstructions were also generated.  Comparison: Abdominal radiographs 06/14/2006.  Findings: Large body habitus.  An negative visualized abdominal and pelvic viscera.  Normal lumbar segmentation. Bone mineralization is within normal limits.  Visualized sacrum appears intact.  Negative SI joints except for degenerative vacuum phenomena.  Lumbar vertebral height and alignment is within normal limits.  The superior L1 vertebral body is not included on the sagittal reformatted images.  No acute osseous abnormality in the lumbar spine.  The following degenerative changes are noted:  L4-L5.  Severe facet hypertrophy.  Vacuum facet phenomenon greater on the left.  Mild circumferential disc bulge.  No significant spinal stenosis.  Mild to moderate bilateral L4 foraminal stenosis.  L5-S1:  Severe disc space loss.  Circumferential disc osteophyte complex and endplate sclerosis.  No spinal stenosis.  Mild to moderate bilateral L5 foraminal stenosis, greater on the left.  IMPRESSION: 1. No acute osseous abnormality in the lumbar spine. 2.  Severe  L4-L5 facet  degeneration contributing to bilateral L4 foraminal stenosis. 3.  Severe L5-L1 disc degeneration contributing to bilateral foraminal stenosis.  Original Report Authenticated By: Randall An, M.D.   Ct Pelvis Wo Contrast  05/16/2011  *RADIOLOGY REPORT*  Clinical Data:  Pelvic pain.  Morbid obesity.  CT PELVIS WITHOUT CONTRAST 05/16/2011:  Technique:  Multidetector CT imaging of the pelvis was performed following the standard protocol without intravenous contrast.  Comparison:  Two-view abdomen x-ray 06/14/2006.  No prior pelvic imaging.  Findings:  No fractures identified involving the bony pelvis. Degenerative changes involving the L5-S1 facet joints and severe disc space narrowing and endplate hypertrophic changes at L5-S1. Mild degenerative changes involving the sacroiliac joints.  Hip joints intact.  Soft tissue window images demonstrate no abnormalities involving the anatomic pelvis.  IMPRESSION:  1.  No fractures involving the pelvis. 2.  Mild degenerative changes in the sacroiliac joints. 3.  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 Degenerative changes in the facet joints at L5-S1.  Original Report Authenticated By: Deniece Portela, M.D.   US Renal  05/13/2011  *RADIOLOGY REPORT*  Clinical Data: Renal insufficiency.  Right flank pain.  RENAL/URINARY TRACT ULTRASOUND COMPLETE  Comparison:  None.  Findings:  Right Kidney:  Normal, measuring 10.4 cm in length.  No hydronephrosis.  Left Kidney:  Normal, measuring 10.9 cm in length.  No hydronephrosis.  Bladder:  Normal.  IMPRESSION: Normal examination.  Original Report Authenticated By: Gerald Stabs, M.D.    Discharge Vitals & PE:  BP 124/82  Pulse 83  Temp(Src) 97.6 F (36.4 C) (Oral)  Resp 20  Ht 5\' 5"  (1.651 m)  Wt 193.958 kg (427 lb 9.6 oz)  BMI 71.16 kg/m2  SpO2 96%  LMP 03/24/2009 Alert, oriented Cipro morbidly obese S1-S2 no murmur rub or gallop Abdomen is obese, no meaningful exam is elicitable Lower  extremities-generalized swelling neurologically grossly intact Discharge Labs:  Results for orders placed during the hospital encounter of 05/12/11 (from the past 24 hour(s))  BASIC METABOLIC PANEL     Status: Abnormal   Collection Time   05/17/11 11:56 AM      Component Value Range   Sodium 137  135 - 145 (mEq/L)   Potassium 4.5  3.5 - 5.1 (mEq/L)   Chloride 101  96 - 112 (mEq/L)   CO2 33 (*) 19 - 32 (mEq/L)   Glucose, Bld 104 (*) 70 - 99 (mg/dL)   BUN 16  6 - 23 (mg/dL)   Creatinine, Ser 1.29 (*) 0.50 - 1.10 (mg/dL)   Calcium 8.2 (*) 8.4 - 10.5 (mg/dL)   GFR calc non Af Amer 49 (*) >90 (mL/min)   GFR calc Af Amer 56 (*) >90 (mL/min)    Disposition and follow-up:   Ms.Malayasia G Minetti was discharged from in stable condition.    Follow-up Appointments: Discharge Orders    Future Orders Please Complete By Expires   Diet - low sodium heart healthy      Increase activity slowly      Call MD for:  persistant nausea and vomiting      Call MD for:  severe uncontrolled pain      Call MD for:  hives      Call MD for:  difficulty breathing, headache or visual disturbances      Call MD for:  extreme fatigue      Home Health      Questions: Responses:   To provide the following care/treatments PT  [place]   Face-to-face encounter      Comments:   I Verneita Griffes certify that this patient is under my care and that I, or a nurse practitioner or physician's assistant working with me, had a face-to-face encounter that meets the physician face-to-face encounter requirements with this patient on 05/18/2011.       Questions: Responses:   The encounter with the patient was in whole, or in part, for the following medical condition, which is the primary reason for home health care Pneumonia   I certify that, based on my findings, the following services are medically necessary home health services Physical therapy   My clinical findings support the need for the above  services High Risk for rehospitalization   Further, I certify that my clinical findings support that this patient is homebound (i.e. absences from home require considerable and taxing effort and are for medical reasons or religious services or infrequently or of short duration when for other reasons) Ambulates short distances less than 300 feet   To provide the following care/treatments PT    OT    RN    [place]      Discharge Medications: Medication List  As of 05/18/2011  8:46 AM   STOP taking these medications         hydrOXYzine 25 MG capsule         TAKE these medications         albuterol (2.5 MG/3ML) 0.083% nebulizer solution   Commonly known as: PROVENTIL   Take 2.5 mg by nebulization as needed. Not more than 4 times a day. Come to ED if persistent breathing problem      PROVENTIL HFA 108 (90 BASE) MCG/ACT inhaler   Generic drug: albuterol   Inhale 1 puff into the lungs every 4 (four) hours as needed. For asthma relief      allopurinol 100 MG tablet   Commonly known as: ZYLOPRIM   Take 1 tablet (100 mg total) by mouth daily.      ALPRAZolam 1 MG tablet   Commonly known as: XANAX   Take 1 mg by mouth 2 (two) times daily.      amitriptyline 75 MG tablet   Commonly known as: ELAVIL   Take 75 mg by mouth at bedtime.      ammonium lactate 12 % cream   Commonly known as: AMLACTIN   Apply topically 2 (two) times daily. In a thin layer on affected area      aspirin 325 MG tablet   Take 325 mg by mouth daily.      cloNIDine 0.2 MG tablet   Commonly known as: CATAPRES   Take 1 tablet (0.2 mg total) by mouth 2 (two) times daily.      eucerin lotion   Apply topically 2 (two) times daily. On affected areas      Fluticasone-Salmeterol 250-50 MCG/DOSE Aepb   Commonly known as: ADVAIR   Inhale 1 puff into the lungs 2 (two) times daily.      guaiFENesin 600 MG 12 hr tablet   Commonly known as: MUCINEX   Take 1 tablet (600 mg total) by mouth 2 (two) times  daily.      lisinopril 20 MG tablet   Commonly known as: PRINIVIL,ZESTRIL   Take 2 tablets (40 mg total) by mouth daily.      miconazole 2 % cream   Commonly known as: MICOTIN  Apply 1 application topically 2 (two) times daily. Apply to upper back region      naphazoline 0.1 % ophthalmic solution   Commonly known as: NAPHCON   Place 1 drop into the left eye 4 (four) times daily as needed.      oxyCODONE-acetaminophen 5-325 MG per tablet   Commonly known as: PERCOCET   Take 2 tablets by mouth every 6 (six) hours as needed.      pantoprazole 40 MG tablet   Commonly known as: PROTONIX   Take 1 tablet (40 mg total) by mouth daily.      Salicylic Acid 6 % Sham   Apply to scalp 1 -2 times a week      solifenacin 5 MG tablet   Commonly known as: VESICARE   Take 1 tablet (5 mg total) by mouth daily.      traZODone 50 MG tablet   Commonly known as: DESYREL   Take 2 tablets (100 mg total) by mouth at bedtime.      triamcinolone ointment 0.1 %   Commonly known as: KENALOG   Apply topically 2 (two) times daily. Apply thin layer over rash           Medications Discontinued During This Encounter  Medication Reason  . oxyCODONE-acetaminophen (PERCOCET) 5-325 MG per tablet 2 tablet   . enoxaparin (LOVENOX) injection 40 mg   . hydrOXYzine (VISTARIL) capsule 25 mg Formulary change  . aspirin tablet [id number] mg Duplicate  . oxyCODONE (Oxy IR/ROXICODONE) immediate release tablet 5 mg   . moxifloxacin (AVELOX) IVPB 400 mg   . hydrOXYzine (ATARAX/VISTARIL) tablet 25 mg   . hydrOXYzine (VISTARIL) 25 MG capsule Stop Taking at Discharge    Signed: [facility] 05/18/2011, 8:46 AM

## 2011-05-18 NOTE — Evaluation (Signed)
Physical Therapy Re-Evaluation Patient Details Name: Kara Vincent MRN: [id number] DOB: [birth year] Today's Date: 05/18/2011  Problem List:  Patient Active Problem List  Diagnoses  . OBSTRUCTIVE SLEEP APNEA  . HYPERTENSION  . ASTHMA  . GERD  . BOILS, RECURRENT  . OSTEOARTHRITIS, KNEES, BILATERAL  . BACK PAIN  . DARIER'S DISEASE  . INSOMNIA  . Urge incontinence  . PNA (pneumonia)  . Dehydration  . Headache    Past Medical History:  Past Medical History  Diagnosis Date  . Asthma   . GERD (gastroesophageal reflux disease)   . Hypertension   . Homelessness   . Low back pain   . 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 Tubal ligation   . Cesarean section     PT Assessment/Plan/Recommendation PT Assessment Clinical Impression Statement: Pt presents with diagnosis of PNA.  She continues to have pain/numbness/tingling in RLE, however now has increased pain in LLE from a fall yesterday getting to bedside commode. Pt tolerated 10' of ambulation, however was noted to have increased SOB (ambulated on 2LO2, sats in low 90's), increased HR (went to 144 w/ amb) and increased pain from pts self report.  Pt also stated being dizzy with ambulation.  BP did drop somewhat, however not to orthostatic levels.  Discussed D/C options with pt and PT recommends short term SNF stay to return pt to prior level of functioning and reduce risk of falls.  Pt states she was walker further than 10' in household, however using scooter for long distances.   PT Recommendation/Assessment: Patient will need skilled PT in the acute care venue PT Problem List: Decreased strength;Decreased activity tolerance;Decreased balance;Decreased mobility;Decreased knowledge of use of DME;Decreased safety awareness;Pain;Cardiopulmonary status limiting activity Barriers to Discharge: Decreased caregiver support PT Therapy Diagnosis : Difficulty  walking;Abnormality of gait;Generalized weakness;Acute pain PT Plan PT Frequency: Min 3X/week PT Treatment/Interventions: DME instruction;Gait training;Functional mobility training;Therapeutic activities;Therapeutic exercise;Patient/family education PT Recommendation Follow Up Recommendations: Skilled nursing facility;Home health PT (depending on Medicare coverage of short term SNF stay.) Equipment Recommended: Defer to next venue PT Goals  Acute Rehab PT Goals PT Goal Formulation: With patient Time For Goal Achievement: 7 days Pt will go Sit to Stand: with modified independence PT Goal: Sit to Stand - Progress: Goal set today (Kept previous goal if pt to D/C home) Pt will Ambulate: 16 - 50 feet;with modified independence;with least restrictive assistive device PT Goal: Ambulate - Progress: Goal set today (Kept previous goal if pt to D/C home. ) Pt will Perform Home Exercise Program: with supervision, verbal cues required/provided PT Goal: Perform Home Exercise Program - Progress: Goal set today (Kept previous goal if pt to D/C home. )  PT Evaluation Precautions/Restrictions  Precautions Precautions: Fall Restrictions Weight Bearing Restrictions: No Prior Functioning   See previous evaluation   Cognition  A & O x 4 Sensation/Coordination  Pt with c/o numbness, tingling and pain in RLE, MD aware.   Extremity Assessment RLE Assessment RLE Assessment: Exceptions to [facility] RLE Strength RLE Overall Strength Comments: WFL per functional assessment with c/o increased pain/numbness/tingling LLE Assessment LLE Assessment: Exceptions to [facility] LLE Strength LLE Overall Strength Comments: WFL per functional assessment with increased pain from fall.  Mobility (including Balance) Bed Mobility Bed Mobility: Yes Supine to Sit: 5: Supervision Supine to Sit Details (indicate cue type and reason): Supervision for safety due to increased pain.  Sit to Supine:  5: Supervision Sit to Supine -  Details (indicate cue type and reason): Supervision for safety due to increased pain.  Transfers Transfers: Yes Sit to Stand: 1: +2 Total assist;Patient percentage (comment);With upper extremity assist;From bed Sit to Stand Details (indicate cue type and reason): Pt assist 80%.  +2 for safety with continued cues for safety and hand placement.  Stand to Sit: 5: Supervision Stand to Sit Details: Requires cues for safety and hand placement. Ambulation/Gait Ambulation/Gait: Yes Ambulation/Gait Assistance: 1: +2 Total assist;Patient percentage (comment) Ambulation/Gait Assistance Details (indicate cue type and reason): Pt assist 75%. +2 for safety with cues for technique with RW.  Pt continues to be unsteady and becomes SOB when ambulating.  Pt w/ c/o pain in B LE  Ambulation Distance (Feet): 10 Feet (x2) Assistive device: Rolling walker (bari walker) Gait Pattern: Antalgic;Decreased step length - left;Decreased step length - right;Decreased stride length;Step-to pattern Gait velocity: slow    Exercise    End of Session PT - End of Session Activity Tolerance: Patient limited by pain;Treatment limited secondary to medical complications (Comment) Patient left: in bed;with call bell in reach;with bed alarm set Nurse Communication: Mobility status for transfers;Mobility status for ambulation General Behavior During Session: [facility] for tasks performed Cognition: [facility] for tasks performed  Page, Betha Loa 05/18/2011, 12:38 PM

## 2011-05-18 NOTE — Progress Notes (Addendum)
Summary: had Kara Vincent,  CSW investigate mode of transport for getting pt home when d/c'd.  To accomodate hover chair, pt can go by city bus which stops near her home, or use [employer] which she is already set up with to take her directly to her home.  [employer] only runs M-F however.  PT in to see/ eval.,. Per PT pt recommended to go to SNF with rehab.,. Pt concerned that MEDICAID would be affected if she goes to a SNF, "may loose check".  CSW back in to talk with her re: going to SNF and concerns re: MEDICAID.  CM, [place] in to see pt re: HH needs: OT,PT, HH AIDE.  HH needs are set up with [facility].   Dr. Verlon Au informed of PT recommendations,  With order to cancel D/C order received.  Pt refusing Lovenox. Dr. Verlon Au informed on rounds.

## 2011-05-18 NOTE — Progress Notes (Addendum)
Per RN, PT/OT recommends SNF.  Discussed SNF process with Pt.  Pt gave CSW permission to fax info to [facility] SNFs.  Yellow CSW Ax note in chart.  CSW to continue to follow.  Bernita Raisin, Junior Social Worker [phone] Weekend Coverage

## 2011-05-18 NOTE — Progress Notes (Signed)
Call from RN stating that Pt may d/c today.  Pt will need transportation.  Pt is bariatric and has a Hover Round chair.  Contacted EMS.  EMS unable to accommodate the Hover Round chair.  Spoke with Pt to determine how Pt arrived at the hospital with her chair.  Pt rode the chair to her closest bus stop.  The bus then transported her and her chair to the ED.  Pt states that she has [employer] and can call upon this service on Monday.  Relayed information to RN.  RN to discuss options with MD and to call CSW is assistance is needed.  Bernita Raisin, [place] Work [phone] Weekend coverage

## 2011-05-18 NOTE — Progress Notes (Addendum)
Cm spoke with pt concerning d/c planning. Per PT eval, SNF reccommended. Pt agrees. CSW contacted concerning referral. CM to follow if [facility] services needed.   Arlean Hopping [phone]

## 2011-05-19 MED ORDER — LACTULOSE 10 GM/15ML PO SOLN: 20.0000 g | Freq: Two times a day (BID) | ORAL | Status: AC | PRN

## 2011-05-19 MED ORDER — HYDROXYZINE HCL 10 MG PO TABS
10.0000 mg | ORAL_TABLET | Freq: Four times a day (QID) | ORAL | Status: DC | PRN
  Administered 2011-05-19: 10 mg via ORAL
  Filled 2011-05-19: qty 1

## 2011-05-19 MED ORDER — GUAIFENESIN-DM 100-10 MG/5ML PO SYRP: 5.0000 mL | ORAL_SOLUTION | ORAL | Status: AC | PRN

## 2011-05-19 MED ORDER — GUAIFENESIN-DM 100-10 MG/5ML PO SYRP
5.0000 mL | ORAL_SOLUTION | ORAL | Status: DC | PRN
  Administered 2011-05-19: 19:00:00 via ORAL
  Administered 2011-05-19: 5 mL via ORAL
  Filled 2011-05-19 (×2): qty 10

## 2011-05-19 NOTE — Plan of Care (Signed)
Problem: Phase I Progression Outcomes Goal: Initial discharge plan identified Outcome: Completed/Met Date Met:  05/19/11 Plans to go home

## 2011-05-19 NOTE — Progress Notes (Addendum)
Patient accepted at [facility] and is agreeable to same.  Anirudh Baiz C. [place] MSW, [place] CSW met with patient after speaking to [facility]. They did not realize patient would need a bariatric bed. Patient would not be able to get rehab because patient is straight medicaid. They would need patients check. CSW met with patient. Patient states that if the rehab will need her money she will just go home because she has bills to pay. CSW communicated same to Case manager and Dr. Verlon Au.

## 2011-05-19 NOTE — Discharge Summary (Signed)
Addendum- Patient cannot be d/c to SNF per SW assessment.  Patient somnolent today and not really wishing to converse.  No specific issues today.  Nursing reports ery somnolent in the morning, and not very rousable.  Will need out-patient pain management strategies. Rest unchanged from yesterday's exam and plan.  Verneita Griffes, MD [facility] [phone]

## 2011-05-19 NOTE — Progress Notes (Signed)
Spoke with [facility] regarding transport of patient with hover round. They stated that they are able to transport her with hover round. They stated that there next stop in front of [place] will be at 20:09. At this time pt has bus pass in hand and NT is helping pt get ready.

## 2011-05-19 NOTE — Progress Notes (Signed)
tct friend Kara Vincent walker patient takes the city bus for her transport and they have areas for the hover round.  Patient was discharged to go home.  Charge nurse is obtaining bus pass for patient.

## 2011-05-20 LAB — CULTURE, BLOOD (ROUTINE X 2)
Culture  Setup Time: 201302200145
Culture: NO GROWTH

## 2011-05-27 ENCOUNTER — Emergency Department (HOSPITAL_COMMUNITY)
Admission: EM | Admit: 2011-05-27 | Discharge: 2011-05-27 | Disposition: A | Discharge status: Home / Self Care | Payer: Medicaid Other | Attending: Emergency Medicine | Admitting: Emergency Medicine

## 2011-05-27 ENCOUNTER — Encounter (HOSPITAL_COMMUNITY): Payer: Self-pay | Admitting: Emergency Medicine

## 2011-05-27 ENCOUNTER — Emergency Department (HOSPITAL_COMMUNITY): Payer: Medicaid Other

## 2011-05-27 DIAGNOSIS — Z9851 Tubal ligation status: Secondary | ICD-10-CM | POA: Insufficient documentation

## 2011-05-27 DIAGNOSIS — R21 Rash and other nonspecific skin eruption: Secondary | ICD-10-CM

## 2011-05-27 DIAGNOSIS — Z7982 Long term (current) use of aspirin: Secondary | ICD-10-CM | POA: Insufficient documentation

## 2011-05-27 DIAGNOSIS — R0602 Shortness of breath: Secondary | ICD-10-CM | POA: Insufficient documentation

## 2011-05-27 DIAGNOSIS — R6883 Chills (without fever): Secondary | ICD-10-CM | POA: Insufficient documentation

## 2011-05-27 DIAGNOSIS — J45909 Unspecified asthma, uncomplicated: Secondary | ICD-10-CM | POA: Insufficient documentation

## 2011-05-27 DIAGNOSIS — E669 Obesity, unspecified: Secondary | ICD-10-CM

## 2011-05-27 DIAGNOSIS — L299 Pruritus, unspecified: Secondary | ICD-10-CM | POA: Insufficient documentation

## 2011-05-27 DIAGNOSIS — R0989 Other specified symptoms and signs involving the circulatory and respiratory systems: Secondary | ICD-10-CM | POA: Insufficient documentation

## 2011-05-27 DIAGNOSIS — M7989 Other specified soft tissue disorders: Secondary | ICD-10-CM | POA: Insufficient documentation

## 2011-05-27 DIAGNOSIS — R059 Cough, unspecified: Secondary | ICD-10-CM | POA: Insufficient documentation

## 2011-05-27 DIAGNOSIS — J40 Bronchitis, not specified as acute or chronic: Secondary | ICD-10-CM | POA: Insufficient documentation

## 2011-05-27 DIAGNOSIS — Z87891 Personal history of nicotine dependence: Secondary | ICD-10-CM | POA: Insufficient documentation

## 2011-05-27 DIAGNOSIS — R609 Edema, unspecified: Secondary | ICD-10-CM | POA: Insufficient documentation

## 2011-05-27 DIAGNOSIS — IMO0001 Reserved for inherently not codable concepts without codable children: Secondary | ICD-10-CM | POA: Insufficient documentation

## 2011-05-27 DIAGNOSIS — K219 Gastro-esophageal reflux disease without esophagitis: Secondary | ICD-10-CM | POA: Insufficient documentation

## 2011-05-27 DIAGNOSIS — R05 Cough: Secondary | ICD-10-CM | POA: Insufficient documentation

## 2011-05-27 DIAGNOSIS — Q828 Other specified congenital malformations of skin: Secondary | ICD-10-CM | POA: Insufficient documentation

## 2011-05-27 DIAGNOSIS — I10 Essential (primary) hypertension: Secondary | ICD-10-CM | POA: Insufficient documentation

## 2011-05-27 LAB — DIFFERENTIAL
Eosinophils Absolute: 0.4 10*3/uL (ref 0.0–0.7)
Lymphocytes Relative: 22 % (ref 12–46)
Lymphs Abs: 1.9 10*3/uL (ref 0.7–4.0)
Neutrophils Relative %: 68 % (ref 43–77)

## 2011-05-27 LAB — CBC
Platelets: 229 10*3/uL (ref 150–400)
RBC: 3.74 MIL/uL — ABNORMAL LOW (ref 3.87–5.11)
WBC: 8.7 10*3/uL (ref 4.0–10.5)

## 2011-05-27 LAB — BASIC METABOLIC PANEL
CO2: 34 mEq/L — ABNORMAL HIGH (ref 19–32)
GFR calc non Af Amer: 44 mL/min — ABNORMAL LOW (ref >90)
Glucose, Bld: 101 mg/dL — ABNORMAL HIGH (ref 70–99)
Potassium: 4 mEq/L (ref 3.5–5.1)
Sodium: 143 mEq/L (ref 135–145)

## 2011-05-27 MED ORDER — ALBUTEROL SULFATE (2.5 MG/3ML) 0.083% IN NEBU: 2.5000 mg | INHALATION_SOLUTION | Freq: Four times a day (QID) | RESPIRATORY_TRACT | Status: DC | PRN

## 2011-05-27 MED ORDER — ALBUTEROL SULFATE (5 MG/ML) 0.5% IN NEBU
5.0000 mg | INHALATION_SOLUTION | Freq: Once | RESPIRATORY_TRACT | Status: AC
  Administered 2011-05-27: 5 mg via RESPIRATORY_TRACT
  Filled 2011-05-27: qty 1

## 2011-05-27 MED ORDER — IPRATROPIUM BROMIDE 0.02 % IN SOLN
RESPIRATORY_TRACT | Status: AC
  Filled 2011-05-27: qty 2.5

## 2011-05-27 MED ORDER — IPRATROPIUM BROMIDE HFA 17 MCG/ACT IN AERS: 2.0000 | INHALATION_SPRAY | Freq: Once | RESPIRATORY_TRACT | Status: DC

## 2011-05-27 MED ORDER — PREDNISONE 10 MG PO TABS: 40.0000 mg | ORAL_TABLET | Freq: Every day | ORAL | Status: DC

## 2011-05-27 MED ORDER — PREDNISONE 20 MG PO TABS
60.0000 mg | ORAL_TABLET | Freq: Once | ORAL | Status: AC
  Administered 2011-05-27: 60 mg via ORAL
  Filled 2011-05-27: qty 3

## 2011-05-27 MED ORDER — IPRATROPIUM BROMIDE 0.02 % IN SOLN
0.5000 mg | Freq: Once | RESPIRATORY_TRACT | Status: AC
  Administered 2011-05-27: 0.5 mg via RESPIRATORY_TRACT

## 2011-05-27 NOTE — Discharge Instructions (Signed)
Continue your neb treatments and inhalers at home. Take benadryl for itching and rash every 6 hrs. Take prednisone as prescribed for SOB and rash. Follow up with your doctor tomorrow or as soon as able for pain medication refill and further evaluation and treatment of your symptoms.    Bronchitis Bronchitis is the body's way of reacting to injury and/or infection (inflammation) of the bronchi. Bronchi are the air tubes that extend from the windpipe into the lungs. If the inflammation becomes severe, it may cause shortness of breath. CAUSES  Inflammation may be caused by:  A virus.   Germs (bacteria).   Dust.   Allergens.   Pollutants and many other irritants.  The cells lining the bronchial tree are covered with tiny hairs (cilia). These constantly beat upward, away from the lungs, toward the mouth. This keeps the lungs free of pollutants. When these cells become too irritated and are unable to do their job, mucus begins to develop. This causes the characteristic cough of bronchitis. The cough clears the lungs when the cilia are unable to do their job. Without either of these protective mechanisms, the mucus would settle in the lungs. Then you would develop pneumonia. Smoking is a common cause of bronchitis and can contribute to pneumonia. Stopping this habit is the single most important thing you can do to help yourself. TREATMENT   Your caregiver may prescribe an antibiotic if the cough is caused by bacteria. Also, medicines that open up your airways make it easier to breathe. Your caregiver may also recommend or prescribe an expectorant. It will loosen the mucus to be coughed up. Only take over-the-counter or prescription medicines for pain, discomfort, or fever as directed by your caregiver.   Removing whatever causes the problem (smoking, for example) is critical to preventing the problem from getting worse.   Cough suppressants may be prescribed for relief of cough symptoms.    Inhaled medicines may be prescribed to help with symptoms now and to help prevent problems from returning.   For those with recurrent (chronic) bronchitis, there may be a need for steroid medicines.  SEEK IMMEDIATE MEDICAL CARE IF:   During treatment, you develop more pus-like mucus (purulent sputum).   You have a fever.   Your baby is older than 3 months with a rectal temperature of 102 F (38.9 C) or higher.   Your baby is 25 months old or younger with a rectal temperature of 100.4 F (38 C) or higher.   You become progressively more ill.   You have increased difficulty breathing, wheezing, or shortness of breath.  It is necessary to seek immediate medical care if you are elderly or sick from any other disease. MAKE SURE YOU:   Understand these instructions.   Will watch your condition.   Will get help right away if you are not doing well or get worse.  Document Released: 03/10/2005 Document Revised: 02/27/2011 Document Reviewed: 01/18/2008 [facility] Patient Information 2012 [employer].

## 2011-05-27 NOTE — ED Provider Notes (Signed)
History     CSN: [id number]  Arrival date & time 05/27/11  0010   First MD Initiated Contact with Patient 05/27/11 0139      Chief Complaint  Patient presents with  . Cough  . Rash    (Consider location/radiation/quality/duration/timing/severity/associated sxs/prior treatment) Patient is a 46 y.o. female presenting with cough and rash. The history is provided by the patient.  Cough This is a recurrent problem. The current episode started more than 1 week ago. The problem occurs constantly. The problem has not changed since onset.The cough is productive of sputum. There has been no fever. Associated symptoms include chills, sweats, myalgias, shortness of breath and wheezing. Pertinent negatives include no chest pain, no rhinorrhea and no sore throat. She has tried cough syrup for the symptoms. Her past medical history is significant for pneumonia and COPD.  Rash  This is a chronic problem. The current episode started more than 1 week ago. The problem has been gradually worsening. There has been no fever. The rash is present on the torso, left upper leg and right upper leg. The pain is at a severity of 3/10. Associated symptoms include itching. She has tried antihistamines for the symptoms. The treatment provided no relief.  Pt states she has had recent hospitalization for pneumonia. States she has had cough since then, it is not improving. Also rash that worsened while in the hospital. Taking benadryl with no relief. Rash very itchy, mainly over the back, abdomen, upper thighs. Pt denies fever, admits to chills. No nausea, vomiting. States pain to right side over rib area. Denies abdominal pain.  Past Medical History  Diagnosis Date  . Asthma   . GERD (gastroesophageal reflux disease)   . Hypertension   . Homelessness   . Low back pain   . 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 Tubal ligation   . Cesarean section     Family  History  Problem Relation Age of Onset  . Asthma Mother   . Diabetes Father   . Heart disease Father   . Stroke Father     History  Substance Use Topics  . Smoking status: Former Smoker    Types: Cigarettes    Quit date: 03/25/2003  . Smokeless tobacco: Former Systems developer    Quit date: 05/27/1978  . Alcohol Use: No    OB History    Grav Para Term Preterm Abortions TAB SAB Ect Mult Living                  Review of Systems  Constitutional: Positive for chills. Negative for fever.  HENT: Negative.  Negative for sore throat and rhinorrhea.   Respiratory: Positive for cough, shortness of breath and wheezing. Negative for chest tightness.   Cardiovascular: Positive for leg swelling. Negative for chest pain and palpitations.  Gastrointestinal: Negative.   Genitourinary: Negative.   Musculoskeletal: Positive for myalgias.  Skin: Positive for itching and rash.  Neurological: Negative.   Psychiatric/Behavioral: Negative.     Allergies  Azithromycin; Doxycycline; Ibuprofen; and Sulfamethoxazole w/trimethoprim  Home Medications   Current Outpatient Rx  Name Route Sig Dispense Refill  . ALBUTEROL SULFATE HFA 108 (90 BASE) MCG/ACT IN AERS Inhalation Inhale 1 puff into the lungs every 4 (four) hours as needed. For asthma relief    . ALBUTEROL SULFATE (2.5 MG/3ML) 0.083% IN NEBU Nebulization Take 2.5 mg by nebulization as needed. Not more than  4 times a day. Come to ED if persistent breathing problem     . ALLOPURINOL 100 MG PO TABS Oral Take 1 tablet (100 mg total) by mouth daily. 30 tablet 2  . ALPRAZOLAM 1 MG PO TABS Oral Take 1 mg by mouth 2 (two) times daily.    [name] AMITRIPTYLINE HCL 75 MG PO TABS Oral Take 75 mg by mouth at bedtime.      . AMMONIUM LACTATE 12 % EX CREA Topical Apply topically 2 (two) times daily. In a thin layer on affected area     . ASPIRIN 325 MG PO TABS Oral Take 325 mg by mouth daily.    [name] CLONIDINE HCL 0.2 MG PO TABS Oral Take 1 tablet (0.2 mg total) by mouth 2  (two) times daily. 60 tablet 11  . DIPHENHYDRAMINE HCL (SLEEP) 25 MG PO TABS Oral Take 25 mg by mouth at bedtime as needed.    Ree Kida EX LOTN Topical Apply topically 2 (two) times daily. On affected areas     . FLUTICASONE-SALMETEROL 250-50 MCG/DOSE IN AEPB Inhalation Inhale 1 puff into the lungs 2 (two) times daily. 1 each 6  . GUAIFENESIN ER 600 MG PO TB12 Oral Take 1 tablet (600 mg total) by mouth 2 (two) times daily. 20 tablet 0  . GUAIFENESIN-DM 100-10 MG/5ML PO SYRP Oral Take 5 mLs by mouth every 4 (four) hours as needed for cough. 118 mL 0  . LACTULOSE 10 GM/15ML PO SOLN Oral Take 30 mLs (20 g total) by mouth 2 (two) times daily as needed. 240 mL 500  . LISINOPRIL 20 MG PO TABS Oral Take 2 tablets (40 mg total) by mouth daily. 30 tablet 5  . MICONAZOLE NITRATE 2 % EX CREA Topical Apply 1 application topically 2 (two) times daily. Apply to upper back region 28.35 g 1  . NAPHAZOLINE HCL 0.1 % OP SOLN Left Eye Place 1 drop into the left eye 4 (four) times daily as needed. 15 mL 1  . OXYCODONE-ACETAMINOPHEN 5-325 MG PO TABS Oral Take 2 tablets by mouth every 6 (six) hours as needed. 45 tablet 0  . PANTOPRAZOLE SODIUM 40 MG PO TBEC Oral Take 1 tablet (40 mg total) by mouth daily. 30 tablet 11  . SALICYLIC ACID 6 % EX SHAM  Apply to scalp 1 -2 times a week 177 mL 11  . SOLIFENACIN SUCCINATE 5 MG PO TABS Oral Take 1 tablet (5 mg total) by mouth daily. 30 tablet 11  . TRAZODONE HCL 50 MG PO TABS Oral Take 2 tablets (100 mg total) by mouth at bedtime. 60 tablet 3  . TRIAMCINOLONE ACETONIDE 0.1 % EX OINT Topical Apply topically 2 (two) times daily. Apply thin layer over rash       BP 141/83  Pulse 77  Temp 98 F (36.7 C)  Resp 16  SpO2 97%  LMP 03/24/2009  Physical Exam  Nursing note and vitals reviewed. Constitutional: She is oriented to person, place, and time. She appears well-developed and well-nourished. No distress.       Morbidly obese  HENT:  Head: Normocephalic.  Eyes:  Conjunctivae are normal.  Neck: Neck supple.  Cardiovascular: Normal rate, regular rhythm and normal heart sounds.   Pulmonary/Chest: She has no rales.       Lung sounds distal. End expiratory wheezing audible on auscultation bilaterally  Abdominal: Soft. Bowel sounds are normal. There is no tenderness.  Musculoskeletal: Normal range of motion. She exhibits edema.  Non pitting edema of LE bilaterally  Neurological: She is alert and oriented to person, place, and time.  Skin: Skin is warm and dry.       erythemous papular rash to the back, abdomen, upper thighs, with excoriations, mild bleeding from scratched lesions  Psychiatric: She has a normal mood and affect.    ED Course  Procedures (including critical care time)  Pt with chronic rash, and frequent visits for cough/copd. Recent admission for pneumonia. Pt here afebrile, NAD, vital signs all within normal. No coughing heard from pt. Will get labs, repeat CXR, nebs ordered.   Results for orders placed during the hospital encounter of 05/27/11  CBC      Component Value Range   WBC 8.7  4.0 - 10.5 (K/uL)   RBC 3.74 (*) 3.87 - 5.11 (MIL/uL)   Hemoglobin 11.0 (*) 12.0 - 15.0 (g/dL)   HCT 35.3 (*) 36.0 - 46.0 (%)   MCV 94.4  78.0 - 100.0 (fL)   MCH 29.4  26.0 - 34.0 (pg)   MCHC 31.2  30.0 - 36.0 (g/dL)   RDW 14.1  11.5 - 15.5 (%)   Platelets 229  150 - 400 (K/uL)  DIFFERENTIAL      Component Value Range   Neutrophils Relative 68  43 - 77 (%)   Neutro Abs 5.9  1.7 - 7.7 (K/uL)   Lymphocytes Relative 22  12 - 46 (%)   Lymphs Abs 1.9  0.7 - 4.0 (K/uL)   Monocytes Relative 5  3 - 12 (%)   Monocytes Absolute 0.5  0.1 - 1.0 (K/uL)   Eosinophils Relative 4  0 - 5 (%)   Eosinophils Absolute 0.4  0.0 - 0.7 (K/uL)   Basophils Relative 0  0 - 1 (%)   Basophils Absolute 0.0  0.0 - 0.1 (K/uL)  BASIC METABOLIC PANEL      Component Value Range   Sodium 143  135 - 145 (mEq/L)   Potassium 4.0  3.5 - 5.1 (mEq/L)   Chloride 104  96 -  112 (mEq/L)   CO2 34 (*) 19 - 32 (mEq/L)   Glucose, Bld 101 (*) 70 - 99 (mg/dL)   BUN 22  6 - 23 (mg/dL)   Creatinine, Ser 1.41 (*) 0.50 - 1.10 (mg/dL)   Calcium 8.7  8.4 - 10.5 (mg/dL)   GFR calc non Af Amer 44 (*) >90 (mL/min)   GFR calc Af Amer 50 (*) >90 (mL/min)   Dg Chest 2 View  05/27/2011  *RADIOLOGY REPORT*  Clinical Data: Cough, history asthma, hypertension  CHEST - 2 VIEW  Comparison: 05/16/2011  Findings: Enlargement of cardiac silhouette. Pulmonary vascular congestion. Mild bibasilar atelectasis. No gross infiltrate or pleural effusion. Lateral views are nondiagnostic due to underpenetration secondary to body habitus. No gross evidence of pneumothorax.  IMPRESSION: Enlargement of cardiac silhouette with pulmonary vascular congestion. Bibasilar atelectasis.  Original Report Authenticated By: Burnetta Sabin, M.D.     No significant findings other then pulmonary vascular congestion on CXR, which is chronic, and elevated creat at 1.4 which is also not a new finding. Pt is wheezing. Neb given with improvement. Pt's sats are 100% on RA on the monitor. She is not tachycardic, doubt PE. Pt denies CP. Pt continues to ask for pain medications, when asked what is bothering her, she states that she is short of breath. Explained to pt that shortness of breath is not treated with pain medications. She then stated that her rash is hurting.  Will d/c home with prednisone and nebs. Pt is to call her PCP tomorrow for pain management and recheck.   No diagnosis found.    [place], [place] 05/27/11 [phone]

## 2011-05-27 NOTE — ED Notes (Signed)
Pt alert, nad, c/o cough, rash to lower leg, not visible by this RN, onset several days ago, recently inpt for pneumonia, resp even unlabored, skin pwd

## 2011-05-27 NOTE — ED Provider Notes (Signed)
Medical screening examination/treatment/procedure(s) were performed by non-physician practitioner and as supervising physician I was immediately available for consultation/collaboration.   Johnna Acosta, MD 05/27/11 [phone]

## 2011-06-05 DIAGNOSIS — L299 Pruritus, unspecified: Secondary | ICD-10-CM | POA: Insufficient documentation

## 2011-06-05 DIAGNOSIS — R21 Rash and other nonspecific skin eruption: Secondary | ICD-10-CM | POA: Insufficient documentation

## 2011-06-05 DIAGNOSIS — L02219 Cutaneous abscess of trunk, unspecified: Secondary | ICD-10-CM | POA: Insufficient documentation

## 2011-06-05 DIAGNOSIS — Z79899 Other long term (current) drug therapy: Secondary | ICD-10-CM | POA: Insufficient documentation

## 2011-06-05 DIAGNOSIS — Z7982 Long term (current) use of aspirin: Secondary | ICD-10-CM | POA: Insufficient documentation

## 2011-06-05 DIAGNOSIS — L03319 Cellulitis of trunk, unspecified: Secondary | ICD-10-CM | POA: Insufficient documentation

## 2011-06-06 ENCOUNTER — Encounter (HOSPITAL_COMMUNITY): Payer: Self-pay | Admitting: Emergency Medicine

## 2011-06-06 ENCOUNTER — Emergency Department (HOSPITAL_COMMUNITY)
Admission: EM | Admit: 2011-06-06 | Discharge: 2011-06-06 | Disposition: A | Discharge status: Home / Self Care | Payer: Medicaid Other | Attending: Emergency Medicine | Admitting: Emergency Medicine

## 2011-06-06 DIAGNOSIS — R21 Rash and other nonspecific skin eruption: Secondary | ICD-10-CM

## 2011-06-06 DIAGNOSIS — L0291 Cutaneous abscess, unspecified: Secondary | ICD-10-CM

## 2011-06-06 MED ORDER — DIPHENHYDRAMINE HCL 25 MG PO TABS: 50.0000 mg | ORAL_TABLET | ORAL | Status: DC | PRN

## 2011-06-06 MED ORDER — DIPHENHYDRAMINE HCL 25 MG PO CAPS
ORAL_CAPSULE | ORAL | Status: AC
  Administered 2011-06-06: 25 mg via ORAL
  Filled 2011-06-06: qty 1

## 2011-06-06 MED ORDER — HYDROCODONE-ACETAMINOPHEN 5-325 MG PO TABS
1.0000 | ORAL_TABLET | Freq: Once | ORAL | Status: AC
  Administered 2011-06-06: 1 via ORAL
  Filled 2011-06-06: qty 1

## 2011-06-06 MED ORDER — CLINDAMYCIN HCL 150 MG PO CAPS: 300.0000 mg | ORAL_CAPSULE | Freq: Four times a day (QID) | ORAL | Status: AC

## 2011-06-06 MED ORDER — DIPHENHYDRAMINE HCL 25 MG PO CAPS
ORAL_CAPSULE | ORAL | Status: AC
  Filled 2011-06-06: qty 1

## 2011-06-06 MED ORDER — DIPHENHYDRAMINE HCL 25 MG PO CAPS
25.0000 mg | ORAL_CAPSULE | Freq: Once | ORAL | Status: AC
  Administered 2011-06-06: 25 mg via ORAL

## 2011-06-06 MED ORDER — CLINDAMYCIN HCL 300 MG PO CAPS
300.0000 mg | ORAL_CAPSULE | Freq: Once | ORAL | Status: AC
  Administered 2011-06-06: 300 mg via ORAL
  Filled 2011-06-06: qty 1

## 2011-06-06 MED ORDER — HYDROCODONE-ACETAMINOPHEN 10-500 MG PO TABS: 1.0000 | ORAL_TABLET | Freq: Four times a day (QID) | ORAL | Status: AC | PRN

## 2011-06-06 MED ORDER — DIPHENHYDRAMINE HCL 25 MG PO CAPS
25.0000 mg | ORAL_CAPSULE | Freq: Once | ORAL | Status: AC
Start: 2011-06-06 — End: 2011-06-06
  Administered 2011-06-06: 25 mg via ORAL

## 2011-06-06 MED ORDER — DIPHENHYDRAMINE HCL 50 MG/ML IJ SOLN: 25.0000 mg | Freq: Once | INTRAMUSCULAR | Status: DC

## 2011-06-06 NOTE — ED Notes (Signed)
Patient c/o progressive worsening rash that she noticed a couple weeks ago to her bilateral legs and back. Patient reports she received an unknown antibiotic a couple weeks ago through her IV and that is when she first started noticing it.  Patient also c/o vaginal pain r/t a cyst on the upper portion of her vagina that she noticed a couple weeks ago, but has now gotten bigger and more inflamed from when it first appears. Patient also c/o lower back and RLE pain.

## 2011-06-06 NOTE — ED Provider Notes (Signed)
History     CSN: [id number]  Arrival date & time 06/05/11  2320   First MD Initiated Contact with Patient 06/06/11 0102      Chief Complaint  Patient presents with  . Cyst    on vagina  . Rash     HPI  History provided by the patient. Patient is a 46 year old female who presents with complaints of persistent pruritic rash to the back and right buttocks and thigh for the past 3 wks. patient reports that she's been seen by her primary care provider for these complaints. She underwent a course of prednisone pills without relief. Patient has also been using miconazole antifungal cream with no changes. she has also seen a dermatologist previously but was no longer able to see them due to [facility] insurance issues. Patient denies any other associated symptoms. She denies any fever, chills, sweats, nausea, vomiting, chest pain, shortness of breath, throat swelling. Patient has secondary complaint of a painful nodule to the pubic area. She has noticed this over the past several days with increasing swelling and pain. Patient reports having similar abscess infection of skin in the past. She has not noticed any drainage or bleeding from the area.    Past Medical History  Diagnosis Date  . Asthma   . GERD (gastroesophageal reflux disease)   . Hypertension   . Homelessness   . Low back pain   . 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 Tubal ligation   . Cesarean section     Family History  Problem Relation Age of Onset  . Asthma Mother   . Diabetes Father   . Heart disease Father   . Stroke Father     History  Substance Use Topics  . Smoking status: Former Smoker    Types: Cigarettes    Quit date: 03/25/2003  . Smokeless tobacco: Former Systems developer    Quit date: 05/27/1978  . Alcohol Use: No    OB History    Grav Para Term Preterm Abortions TAB SAB Ect Mult Living                  Review of Systems  Constitutional:  Negative for fever and chills.  Skin: Positive for rash.  All other systems reviewed and are negative.    Allergies  Azithromycin; Doxycycline; Ibuprofen; and Sulfamethoxazole w/trimethoprim  Home Medications   Current Outpatient Rx  Name Route Sig Dispense Refill  . ALBUTEROL SULFATE HFA 108 (90 BASE) MCG/ACT IN AERS Inhalation Inhale 1 puff into the lungs every 4 (four) hours as needed. For asthma relief    . ALBUTEROL SULFATE (2.5 MG/3ML) 0.083% IN NEBU Nebulization Take 3 mLs (2.5 mg total) by nebulization every 6 (six) hours as needed for wheezing. 75 mL 12  . ALLOPURINOL 100 MG PO TABS Oral Take 1 tablet (100 mg total) by mouth daily. 30 tablet 2  . ALPRAZOLAM 1 MG PO TABS Oral Take 1 mg by mouth 2 (two) times daily.    [name] AMITRIPTYLINE HCL 75 MG PO TABS Oral Take 75 mg by mouth at bedtime.      . AMMONIUM LACTATE 12 % EX CREA Topical Apply topically 2 (two) times daily. In a thin layer on affected area     . ASPIRIN 325 MG PO TABS Oral Take 325 mg by mouth daily.    [name] CLONIDINE HCL 0.2 MG PO TABS Oral Take 1  tablet (0.2 mg total) by mouth 2 (two) times daily. 60 tablet 11  . DIPHENHYDRAMINE HCL (SLEEP) 25 MG PO TABS Oral Take 25 mg by mouth at bedtime as needed.    Ree Kida EX LOTN Topical Apply topically 2 (two) times daily. On affected areas     . FLUTICASONE-SALMETEROL 250-50 MCG/DOSE IN AEPB Inhalation Inhale 1 puff into the lungs 2 (two) times daily. 1 each 6  . INDOMETHACIN ER 75 MG PO CPCR Oral Take 75 mg by mouth daily.    [name] LISINOPRIL 20 MG PO TABS Oral Take 2 tablets (40 mg total) by mouth daily. 30 tablet 5  . MICONAZOLE NITRATE 2 % EX CREA Topical Apply 1 application topically 2 (two) times daily. Apply to upper back region 28.35 g 1  . NAPROXEN 500 MG PO TABS Oral Take 500 mg by mouth 2 (two) times daily with a meal.    . OXYCODONE-ACETAMINOPHEN 10-325 MG PO TABS Oral Take 1 tablet by mouth every 4 (four) hours as needed. For pain    . PANTOPRAZOLE SODIUM 40 MG PO  TBEC Oral Take 1 tablet (40 mg total) by mouth daily. 30 tablet 11  . SALICYLIC ACID 6 % EX SHAM  Apply to scalp 1 -2 times a week 177 mL 11  . SOLIFENACIN SUCCINATE 5 MG PO TABS Oral Take 1 tablet (5 mg total) by mouth daily. 30 tablet 11  . TRAZODONE HCL 50 MG PO TABS Oral Take 2 tablets (100 mg total) by mouth at bedtime. 60 tablet 3  . TRIAMCINOLONE ACETONIDE 0.1 % EX OINT Topical Apply topically 2 (two) times daily. Apply thin layer over rash     . PREDNISONE 10 MG PO TABS Oral Take 4 tablets (40 mg total) by mouth daily. 16 tablet 0    BP 110/58  Pulse 75  Temp(Src) 98.9 F (37.2 C) (Oral)  Resp 18  Ht 5\' 5"  (1.651 m)  SpO2 95%  LMP 03/24/2009  Physical Exam  Nursing note and vitals reviewed. Constitutional: She is oriented to person, place, and time. She appears well-developed and well-nourished. No distress.       Morbidly obese  HENT:  Head: Normocephalic.  Cardiovascular: Normal rate and regular rhythm.   Pulmonary/Chest: Effort normal and breath sounds normal. No respiratory distress. She has no wheezes. She has no rales.  Abdominal: Soft. She exhibits no distension. There is no tenderness.       Morbidly obese  Neurological: She is alert and oriented to person, place, and time.  Skin: Skin is warm and dry. No rash noted.       Maculopapular rash across the entire back with many secondary excoriations and scabs. Similar rash to right lateral buttocks and thigh with multiple secondary excoriations. No induration of skin. 3 cm firm nodule with slight induration in the midline of pubic region. Tender to palpation.  Psychiatric: She has a normal mood and affect. Her behavior is normal.    ED Course  Procedures   INCISION AND DRAINAGE Performed by: Martie Lee Consent: Verbal consent obtained. Risks and benefits: risks, benefits and alternatives were discussed Type: abscess  Body area: pubic area  Anesthesia: local infiltration  Local anesthetic: lidocaine 2%  without epinephrine  Anesthetic total: 2 ml  Complexity: complex Blunt dissection to break up loculations  Drainage: purulent  Drainage amount: small to moderate  Packing material: 1/4 in iodoform gauze  Patient tolerance: Patient tolerated the procedure well with no immediate complications.  1. Abscess   2. Rash       MDM  2:45 AM patient seen and evaluated. Patient no acute distress.  Patient seen and evaluated with attending physician. Punch biopsy performed for further evaluation of persistent skin rash. She is afebrile with no other concerning symptoms.      Martie Lee, [place] 06/06/11 1920

## 2011-06-06 NOTE — ED Notes (Signed)
Per Pt: rash/scabs to back and legs that she is taking prednisone for but it is not improved. Now also reports a "cyst and bumps" in her vagina for 2 days.

## 2011-06-06 NOTE — ED Provider Notes (Addendum)
Medical screening examination/treatment/procedure(s) were conducted as a shared visit with non-physician practitioner(s) and myself.  I personally evaluated the patient during the encounter  Generalized maculopapular rash primarily on the trunk and right lower extremity. Multiple lesions are excoriated, this may be consistent with frequent scratching.   Procedure: Punch Biopsy  The patient was prepped and draped in usual sterile fashion. A small region of skin on the right lateral thigh was anesthetized with about 1/2 mL of 1% lidocaine with epinephrine. A 3 mm biopsy punch was used to create a small cylinder of skin which was gently grabbed with forceps and the base cut free from the deep dermis. The remaining skin defect was closed with one single interrupted suture of 5-0 Vicryl. The tissue was sent to pathology for microscopic histologic analysis.  The patient tolerated the procedure well there were no complications.   Wynetta Fines, MD 06/06/11 0354  Wynetta Fines, MD 06/06/11 [phone]

## 2011-06-06 NOTE — Discharge Instructions (Signed)
You were seen and evaluated today for your symptoms of rash and abscess infection. Your providers today performed a biopsy of your rash for further testing. Please followup to primary care provider for the results of this testing and continue treatment of your symptoms. You're abscess infection was also treated today with a small incision and drainage. Please have this rechecked in 48 hours to be sure there is no increased swelling or pain. If you develop any symptoms of fever, chills, sweats, persistent nausea vomiting please return to the emergency room.  Abscess An abscess (boil or furuncle) is an infected area that contains a collection of pus.  SYMPTOMS Signs and symptoms of an abscess include pain, tenderness, redness, or hardness. You may feel a moveable soft area under your skin. An abscess can occur anywhere in the body.  TREATMENT  A surgical cut (incision) may be made over your abscess to drain the pus. Gauze may be packed into the space or a drain may be looped through the abscess cavity (pocket). This provides a drain that will allow the cavity to heal from the inside outwards. The abscess may be painful for a few days, but should feel much better if it was drained.  Your abscess, if seen early, may not have localized and may not have been drained. If not, another appointment may be required if it does not get better on its own or with medications. HOME CARE INSTRUCTIONS   Only take over-the-counter or prescription medicines for pain, discomfort, or fever as directed by your caregiver.   Take your antibiotics as directed if they were prescribed. Finish them even if you start to feel better.   Keep the skin and clothes clean around your abscess.   If the abscess was drained, you will need to use gauze dressing to collect any draining pus. Dressings will typically need to be changed 3 or more times a day.   The infection may spread by skin contact with others. Avoid skin contact as much  as possible.   Practice good hygiene. This includes regular hand washing, cover any draining skin lesions, and do not share personal care items.   If you participate in sports, do not share athletic equipment, towels, whirlpools, or personal care items. Shower after every practice or tournament.   If a draining area cannot be adequately covered:   Do not participate in sports.   Children should not participate in day care until the wound has healed or drainage stops.   If your caregiver has given you a follow-up appointment, it is very important to keep that appointment. Not keeping the appointment could result in a much worse infection, chronic or permanent injury, pain, and disability. If there is any problem keeping the appointment, you must call back to this facility for assistance.  SEEK MEDICAL CARE IF:   You develop increased pain, swelling, redness, drainage, or bleeding in the wound site.   You develop signs of generalized infection including muscle aches, chills, fever, or a general ill feeling.   You have an oral temperature above 102 F (38.9 C).  MAKE SURE YOU:   Understand these instructions.   Will watch your condition.   Will get help right away if you are not doing well or get worse.  Document Released: 12/18/2004 Document Revised: 02/27/2011 Document Reviewed: 10/12/2007 [facility] Patient Information 2012 [place].   Rash A rash is a change in the color or texture of your skin. There are many different types of  rashes. You may have other problems that accompany your rash. CAUSES   Infections.   Allergic reactions. This can include allergies to pets or foods.   Certain medicines.   Exposure to certain chemicals, soaps, or cosmetics.   Heat.   Exposure to poisonous plants.   Tumors, both cancerous and noncancerous.  SYMPTOMS   Redness.   Scaly skin.   Itchy skin.   Dry or cracked skin.   Bumps.   Blisters.   Pain.  DIAGNOSIS  Your  caregiver may do a physical exam to determine what type of rash you have. A skin sample (biopsy) may be taken and examined under a microscope. TREATMENT  Treatment depends on the type of rash you have. Your caregiver may prescribe certain medicines. For serious conditions, you may need to see a skin doctor (dermatologist). HOME CARE INSTRUCTIONS   Avoid the substance that caused your rash.   Do not scratch your rash. This can cause infection.   You may take cool baths to help stop itching.   Only take over-the-counter or prescription medicines as directed by your caregiver.   Keep all follow-up appointments as directed by your caregiver.  SEEK IMMEDIATE MEDICAL CARE IF:  You have increasing pain, swelling, or redness.   You have a fever.   You have new or severe symptoms.   You have body aches, diarrhea, or vomiting.   Your rash is not better after 3 days.  MAKE SURE YOU:  Understand these instructions.   Will watch your condition.   Will get help right away if you are not doing well or get worse.  Document Released: 02/28/2002 Document Revised: 02/27/2011 Document Reviewed: 12/23/2010 [facility] Patient Information 2012 [place].

## 2011-06-14 ENCOUNTER — Encounter (HOSPITAL_COMMUNITY): Payer: Self-pay | Admitting: *Deleted

## 2011-06-14 ENCOUNTER — Emergency Department (HOSPITAL_COMMUNITY)
Admission: EM | Admit: 2011-06-14 | Discharge: 2011-06-15 | Disposition: A | Discharge status: Home / Self Care | Payer: Medicaid Other | Attending: Emergency Medicine | Admitting: Emergency Medicine

## 2011-06-14 DIAGNOSIS — I10 Essential (primary) hypertension: Secondary | ICD-10-CM | POA: Insufficient documentation

## 2011-06-14 DIAGNOSIS — M79609 Pain in unspecified limb: Secondary | ICD-10-CM | POA: Insufficient documentation

## 2011-06-14 DIAGNOSIS — J45909 Unspecified asthma, uncomplicated: Secondary | ICD-10-CM | POA: Insufficient documentation

## 2011-06-14 DIAGNOSIS — M549 Dorsalgia, unspecified: Secondary | ICD-10-CM | POA: Insufficient documentation

## 2011-06-14 DIAGNOSIS — K219 Gastro-esophageal reflux disease without esophagitis: Secondary | ICD-10-CM | POA: Insufficient documentation

## 2011-06-14 DIAGNOSIS — R0602 Shortness of breath: Secondary | ICD-10-CM | POA: Insufficient documentation

## 2011-06-14 DIAGNOSIS — Z79899 Other long term (current) drug therapy: Secondary | ICD-10-CM | POA: Insufficient documentation

## 2011-06-14 DIAGNOSIS — L299 Pruritus, unspecified: Secondary | ICD-10-CM | POA: Insufficient documentation

## 2011-06-14 DIAGNOSIS — N949 Unspecified condition associated with female genital organs and menstrual cycle: Secondary | ICD-10-CM | POA: Insufficient documentation

## 2011-06-14 DIAGNOSIS — IMO0002 Reserved for concepts with insufficient information to code with codable children: Secondary | ICD-10-CM | POA: Insufficient documentation

## 2011-06-14 DIAGNOSIS — Q828 Other specified congenital malformations of skin: Secondary | ICD-10-CM | POA: Insufficient documentation

## 2011-06-14 DIAGNOSIS — Z7982 Long term (current) use of aspirin: Secondary | ICD-10-CM | POA: Insufficient documentation

## 2011-06-14 DIAGNOSIS — L02419 Cutaneous abscess of limb, unspecified: Secondary | ICD-10-CM

## 2011-06-14 DIAGNOSIS — R21 Rash and other nonspecific skin eruption: Secondary | ICD-10-CM | POA: Insufficient documentation

## 2011-06-14 LAB — POCT I-STAT, CHEM 8
BUN: 20 mg/dL (ref 6–23)
Creatinine, Ser: 1.3 mg/dL — ABNORMAL HIGH (ref 0.50–1.10)
Hemoglobin: 12.6 g/dL (ref 12.0–15.0)
Potassium: 3.9 mEq/L (ref 3.5–5.1)
Sodium: 144 mEq/L (ref 135–145)

## 2011-06-14 LAB — CBC
Platelets: 274 10*3/uL (ref 150–400)
RBC: 4.03 MIL/uL (ref 3.87–5.11)
WBC: 10.3 10*3/uL (ref 4.0–10.5)

## 2011-06-14 LAB — DIFFERENTIAL
Basophils Absolute: 0 10*3/uL (ref 0.0–0.1)
Eosinophils Absolute: 0.4 10*3/uL (ref 0.0–0.7)
Lymphocytes Relative: 20 % (ref 12–46)
Lymphs Abs: 2 10*3/uL (ref 0.7–4.0)
Neutrophils Relative %: 71 % (ref 43–77)

## 2011-06-14 MED ORDER — OXYCODONE-ACETAMINOPHEN 5-325 MG PO TABS
1.0000 | ORAL_TABLET | Freq: Once | ORAL | Status: AC
  Administered 2011-06-15: 1 via ORAL
  Filled 2011-06-14: qty 1

## 2011-06-14 MED ORDER — CEPHALEXIN 500 MG PO CAPS
500.0000 mg | ORAL_CAPSULE | Freq: Once | ORAL | Status: AC
  Administered 2011-06-14: 500 mg via ORAL
  Filled 2011-06-14: qty 1

## 2011-06-14 MED ORDER — FAMOTIDINE 20 MG PO TABS
20.0000 mg | ORAL_TABLET | Freq: Once | ORAL | Status: AC
  Administered 2011-06-15: 20 mg via ORAL
  Filled 2011-06-14: qty 1

## 2011-06-14 MED ORDER — ONDANSETRON 4 MG PO TBDP
4.0000 mg | ORAL_TABLET | Freq: Once | ORAL | Status: AC
  Administered 2011-06-15: 4 mg via ORAL
  Filled 2011-06-14: qty 1

## 2011-06-14 NOTE — ED Notes (Signed)
Pt c/o no improvement of rash, increased incidence of boils and exacerbation of chronic shortness of breath. Pt uses a CPAP at home.

## 2011-06-14 NOTE — ED Provider Notes (Signed)
History     CSN: [id number]  Arrival date & time 06/14/11  1947   First MD Initiated Contact with Patient 06/14/11 2116      Chief Complaint  Patient presents with  . Rash  . Recurrent Skin Infections  . Shortness of Breath    (Consider location/radiation/quality/duration/timing/severity/associated sxs/prior treatment) HPI Comments: Patient with a history of Dariers syndrome with periodic flares now reports, that she's had increased and her rash, itching, and she's also had several small abscesses in her axilla, left greater than right.  Recently, she was also hospitalized in January for a flare with an abscess on her pubic mons.  She states she is still taking antibiotic, but has none listed at this time  Patient is a 46 y.o. female presenting with rash and shortness of breath. The history is provided by the patient.  Rash  This is a chronic problem. The problem has been gradually worsening. The problem is associated with nothing. There has been no fever. The rash is present on the genitalia, back, right upper leg, left upper leg, left arm and right arm. The pain is at a severity of 3/10. The pain is moderate. The pain has been constant since onset. Associated symptoms include itching and pain. Pertinent negatives include no blisters and no weeping. The treatment provided no relief.  Shortness of Breath  Associated symptoms include shortness of breath. Pertinent negatives include no fever.    Past Medical History  Diagnosis Date  . Asthma   . GERD (gastroesophageal reflux disease)   . Hypertension   . Homelessness   . Low back pain   . 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 Tubal ligation   . Cesarean section     Family History  Problem Relation Age of Onset  . Asthma Mother   . Diabetes Father   . Heart disease Father   . Stroke Father     History  Substance Use Topics  . Smoking status: Former Smoker   Types: Cigarettes    Quit date: 03/25/2003  . Smokeless tobacco: Former Systems developer    Quit date: 05/27/1978  . Alcohol Use: No    OB History    Grav Para Term Preterm Abortions TAB SAB Ect Mult Living                  Review of Systems  Constitutional: Negative for fever and chills.  HENT: Negative for congestion.   Respiratory: Positive for shortness of breath.   Cardiovascular: Negative for leg swelling.  Gastrointestinal: Negative for nausea and vomiting.  Skin: Positive for itching, rash and wound.    Allergies  Azithromycin; Doxycycline; Ibuprofen; and Sulfamethoxazole w/trimethoprim  Home Medications   Current Outpatient Rx  Name Route Sig Dispense Refill  . ALBUTEROL SULFATE HFA 108 (90 BASE) MCG/ACT IN AERS Inhalation Inhale 1 puff into the lungs every 4 (four) hours as needed. For asthma relief    . ALBUTEROL SULFATE (2.5 MG/3ML) 0.083% IN NEBU Nebulization Take 3 mLs (2.5 mg total) by nebulization every 6 (six) hours as needed for wheezing. 75 mL 12  . ALLOPURINOL 100 MG PO TABS Oral Take 1 tablet (100 mg total) by mouth daily. 30 tablet 2  . ALPRAZOLAM 1 MG PO TABS Oral Take 1 mg by mouth 2 (two) times daily.    [name] AMITRIPTYLINE HCL 75 MG PO TABS Oral Take 75 mg by mouth at  bedtime.      . AMMONIUM LACTATE 12 % EX CREA Topical Apply topically 2 (two) times daily. In a thin layer on affected area     . ASPIRIN 325 MG PO TABS Oral Take 325 mg by mouth daily.    . ATORVASTATIN CALCIUM 20 MG PO TABS Oral Take 20 mg by mouth daily.    [name] BENZONATATE 100 MG PO CAPS Oral Take 100-200 mg by mouth 3 (three) times daily as needed. Cough    . CLINDAMYCIN HCL 150 MG PO CAPS Oral Take 2 capsules (300 mg total) by mouth every 6 (six) hours. 42 capsule 0  . CLONIDINE HCL 0.2 MG PO TABS Oral Take 1 tablet (0.2 mg total) by mouth 2 (two) times daily. 60 tablet 11  . DIPHENHYDRAMINE HCL 25 MG PO TABS Oral Take 2 tablets (50 mg total) by mouth every 4 (four) hours as needed for itching. 20  tablet 0  . EUCERIN EX LOTN Topical Apply topically 2 (two) times daily. On affected areas     . FLUTICASONE-SALMETEROL 250-50 MCG/DOSE IN AEPB Inhalation Inhale 1 puff into the lungs 2 (two) times daily. 1 each 6  . FUROSEMIDE 20 MG PO TABS Oral Take 20 mg by mouth daily.    [name] HYDROCODONE-ACETAMINOPHEN 10-500 MG PO TABS Oral Take 1 tablet by mouth every 6 (six) hours as needed for pain. 20 tablet 0  . INDOMETHACIN ER 75 MG PO CPCR Oral Take 75 mg by mouth daily.    [name] KETOCONAZOLE 200 MG PO TABS Oral Take 200 mg by mouth daily.    [name] LISINOPRIL 20 MG PO TABS Oral Take 2 tablets (40 mg total) by mouth daily. 30 tablet 5  . LUBIPROSTONE 24 MCG PO CAPS Oral Take 24 mcg by mouth 2 (two) times daily with a meal.    . MICONAZOLE NITRATE 2 % EX CREA Topical Apply 1 application topically 2 (two) times daily. Apply to upper back region 28.35 g 1  . NAPROXEN 500 MG PO TABS Oral Take 500 mg by mouth 2 (two) times daily with a meal.    . PANTOPRAZOLE SODIUM 40 MG PO TBEC Oral Take 1 tablet (40 mg total) by mouth daily. 30 tablet 11  . SALICYLIC ACID 6 % EX SHAM  Apply to scalp 1 -2 times a week 177 mL 11  . SOLIFENACIN SUCCINATE 5 MG PO TABS Oral Take 1 tablet (5 mg total) by mouth daily. 30 tablet 11  . TRAZODONE HCL 50 MG PO TABS Oral Take 2 tablets (100 mg total) by mouth at bedtime. 60 tablet 3  . TRIAMCINOLONE ACETONIDE 0.1 % EX OINT Topical Apply topically 2 (two) times daily. Apply thin layer over rash     . CEPHALEXIN 500 MG PO CAPS Oral Take 1 capsule (500 mg total) by mouth 4 (four) times daily. 28 capsule 0  . FAMOTIDINE 20 MG PO TABS Oral Take 1 tablet (20 mg total) by mouth 2 (two) times daily. 30 tablet 0  . OXYCODONE-ACETAMINOPHEN 10-650 MG PO TABS Oral Take 1 tablet by mouth every 6 (six) hours as needed for pain. 20 tablet 0    BP 124/94  Pulse 74  Temp(Src) 98 F (36.7 C) (Oral)  Resp 16  SpO2 94%  LMP 03/24/2009  Physical Exam  Constitutional: She appears well-developed and  well-nourished.  HENT:  Head: Normocephalic.  Eyes: Pupils are equal, round, and reactive to light.  Neck: Normal range of motion.  Cardiovascular: Normal rate.  Pulmonary/Chest: Effort normal.  Abdominal: There is no tenderness.       Morbidly obese, with multiple skin folds  Musculoskeletal: Normal range of motion.  Neurological: She is alert.  Skin: Rash noted.       Typical rash, anterior syndrome on back, legs, arms, her back is quite irritated from scratching.  The abscess, that she had on her pubic mons is firm.  No indication of fluctuance minimally tender, has several very small superficial abscesses, and bilateral axilla.    ED Course  Procedures (including critical care time)  Labs Reviewed  CBC - Abnormal; Notable for the following:    Hemoglobin 11.9 (*)    All other components within normal limits  POCT I-STAT, CHEM 8 - Abnormal; Notable for the following:    Creatinine, Ser 1.30 (*)    Glucose, Bld 111 (*)    All other components within normal limits  DIFFERENTIAL  LAB REPORT - SCANNED   No results found.   1. DARIER'S DISEASE   2. Abscess of axilla       MDM  Have seen Ms. Corlis Leak before her rash does not appear to be super infected, although she has scratched.  Many areas of raw.  She does have several small abscesses, and bilateral axilla, which do not need I&D at this time.  The previously lanced abscess on her pubic mons is firm.  No drainage no fluctuance minimal erythema.  Will check CBC i-STAT        Garald Balding, NP 06/15/11 2012

## 2011-06-15 MED ORDER — OXYCODONE-ACETAMINOPHEN 5-325 MG PO TABS: 1.0000 | ORAL_TABLET | Freq: Four times a day (QID) | ORAL | Status: DC | PRN

## 2011-06-15 MED ORDER — OXYCODONE-ACETAMINOPHEN 10-650 MG PO TABS: 1.0000 | ORAL_TABLET | Freq: Four times a day (QID) | ORAL | Status: AC | PRN

## 2011-06-15 MED ORDER — FAMOTIDINE 20 MG PO TABS: 20.0000 mg | ORAL_TABLET | Freq: Two times a day (BID) | ORAL | Status: AC

## 2011-06-15 MED ORDER — CEPHALEXIN 500 MG PO CAPS: 500.0000 mg | ORAL_CAPSULE | Freq: Four times a day (QID) | ORAL | Status: AC

## 2011-06-15 NOTE — ED Provider Notes (Signed)
Medical screening examination/treatment/procedure(s) were conducted as a shared visit with non-physician practitioner(s) and myself.  I personally evaluated the patient during the encounter.  Patient has long-standing rash. No evidence of cellulitis. Can treat as outpatient  Nat Christen, MD 06/15/11 2121

## 2011-06-15 NOTE — Discharge Instructions (Signed)
Kara Vincent rash does not look superinfected.  You have some small superficial abscesses in your axilla.  The abscess that you had on your pubic mons is healing.  You have been given a different antibiotic, Keflex, with instructions to take 500 mg 4 times a day for the next 7 days.  Please make an appointment with your primary care provider for followup

## 2011-06-27 ENCOUNTER — Emergency Department (HOSPITAL_COMMUNITY)
Admission: EM | Admit: 2011-06-27 | Discharge: 2011-06-28 | Disposition: A | Discharge status: Home / Self Care | Payer: Medicaid Other | Attending: Emergency Medicine | Admitting: Emergency Medicine

## 2011-06-27 ENCOUNTER — Encounter (HOSPITAL_COMMUNITY): Payer: Self-pay | Admitting: *Deleted

## 2011-06-27 DIAGNOSIS — K219 Gastro-esophageal reflux disease without esophagitis: Secondary | ICD-10-CM | POA: Insufficient documentation

## 2011-06-27 DIAGNOSIS — L2989 Other pruritus: Secondary | ICD-10-CM | POA: Insufficient documentation

## 2011-06-27 DIAGNOSIS — Z79899 Other long term (current) drug therapy: Secondary | ICD-10-CM | POA: Insufficient documentation

## 2011-06-27 DIAGNOSIS — H5789 Other specified disorders of eye and adnexa: Secondary | ICD-10-CM | POA: Insufficient documentation

## 2011-06-27 DIAGNOSIS — H9210 Otorrhea, unspecified ear: Secondary | ICD-10-CM | POA: Insufficient documentation

## 2011-06-27 DIAGNOSIS — H60399 Other infective otitis externa, unspecified ear: Secondary | ICD-10-CM | POA: Insufficient documentation

## 2011-06-27 DIAGNOSIS — H109 Unspecified conjunctivitis: Secondary | ICD-10-CM | POA: Insufficient documentation

## 2011-06-27 DIAGNOSIS — L02219 Cutaneous abscess of trunk, unspecified: Secondary | ICD-10-CM | POA: Insufficient documentation

## 2011-06-27 DIAGNOSIS — H938X9 Other specified disorders of ear, unspecified ear: Secondary | ICD-10-CM | POA: Insufficient documentation

## 2011-06-27 DIAGNOSIS — L0291 Cutaneous abscess, unspecified: Secondary | ICD-10-CM

## 2011-06-27 DIAGNOSIS — I10 Essential (primary) hypertension: Secondary | ICD-10-CM | POA: Insufficient documentation

## 2011-06-27 DIAGNOSIS — R51 Headache: Secondary | ICD-10-CM | POA: Insufficient documentation

## 2011-06-27 DIAGNOSIS — R110 Nausea: Secondary | ICD-10-CM | POA: Insufficient documentation

## 2011-06-27 DIAGNOSIS — L03319 Cellulitis of trunk, unspecified: Secondary | ICD-10-CM | POA: Insufficient documentation

## 2011-06-27 DIAGNOSIS — H11419 Vascular abnormalities of conjunctiva, unspecified eye: Secondary | ICD-10-CM | POA: Insufficient documentation

## 2011-06-27 DIAGNOSIS — L298 Other pruritus: Secondary | ICD-10-CM | POA: Insufficient documentation

## 2011-06-27 DIAGNOSIS — H6090 Unspecified otitis externa, unspecified ear: Secondary | ICD-10-CM

## 2011-06-27 DIAGNOSIS — R21 Rash and other nonspecific skin eruption: Secondary | ICD-10-CM | POA: Insufficient documentation

## 2011-06-27 DIAGNOSIS — Q828 Other specified congenital malformations of skin: Secondary | ICD-10-CM | POA: Insufficient documentation

## 2011-06-27 DIAGNOSIS — L02419 Cutaneous abscess of limb, unspecified: Secondary | ICD-10-CM | POA: Insufficient documentation

## 2011-06-27 DIAGNOSIS — H9209 Otalgia, unspecified ear: Secondary | ICD-10-CM | POA: Insufficient documentation

## 2011-06-27 DIAGNOSIS — H11429 Conjunctival edema, unspecified eye: Secondary | ICD-10-CM | POA: Insufficient documentation

## 2011-06-27 DIAGNOSIS — J45909 Unspecified asthma, uncomplicated: Secondary | ICD-10-CM | POA: Insufficient documentation

## 2011-06-27 NOTE — ED Notes (Signed)
Pt has multiple complaints, including recurrent abscesses, ear pain and drainage, nausea, headache, and weakness.

## 2011-06-28 ENCOUNTER — Emergency Department (HOSPITAL_COMMUNITY): Payer: Medicaid Other

## 2011-06-28 LAB — URINALYSIS, ROUTINE W REFLEX MICROSCOPIC
Bilirubin Urine: NEGATIVE
Glucose, UA: NEGATIVE mg/dL
Hgb urine dipstick: NEGATIVE
Ketones, ur: NEGATIVE mg/dL
Leukocytes, UA: NEGATIVE
Nitrite: NEGATIVE
Protein, ur: NEGATIVE mg/dL
Specific Gravity, Urine: 1.028 (ref 1.005–1.030)
Urobilinogen, UA: 0.2 mg/dL (ref 0.0–1.0)
pH: 7 (ref 5.0–8.0)

## 2011-06-28 LAB — DIFFERENTIAL
Basophils Absolute: 0 K/uL (ref 0.0–0.1)
Basophils Relative: 0 % (ref 0–1)
Eosinophils Absolute: 0.3 K/uL (ref 0.0–0.7)
Eosinophils Relative: 4 % (ref 0–5)
Lymphocytes Relative: 27 % (ref 12–46)
Lymphs Abs: 2.5 K/uL (ref 0.7–4.0)
Monocytes Absolute: 0.6 K/uL (ref 0.1–1.0)
Monocytes Relative: 6 % (ref 3–12)
Neutro Abs: 5.8 K/uL (ref 1.7–7.7)
Neutrophils Relative %: 63 % (ref 43–77)

## 2011-06-28 LAB — COMPREHENSIVE METABOLIC PANEL
Alkaline Phosphatase: 66 U/L (ref 39–117)
BUN: 15 mg/dL (ref 6–23)
CO2: 34 mEq/L — ABNORMAL HIGH (ref 19–32)
Chloride: 101 mEq/L (ref 96–112)
GFR calc Af Amer: 68 mL/min — ABNORMAL LOW (ref >90)
Glucose, Bld: 95 mg/dL (ref 70–99)
Potassium: 4.3 mEq/L (ref 3.5–5.1)
Total Bilirubin: 0.2 mg/dL — ABNORMAL LOW (ref 0.3–1.2)

## 2011-06-28 LAB — CBC
HCT: 38.4 % (ref 36.0–46.0)
Hemoglobin: 11.9 g/dL — ABNORMAL LOW (ref 12.0–15.0)
MCH: 29.8 pg (ref 26.0–34.0)
MCHC: 31 g/dL (ref 30.0–36.0)
MCV: 96 fL (ref 78.0–100.0)
Platelets: 304 K/uL (ref 150–400)
RBC: 4 MIL/uL (ref 3.87–5.11)
RDW: 14.1 % (ref 11.5–15.5)
WBC: 9.2 K/uL (ref 4.0–10.5)

## 2011-06-28 MED ORDER — MORPHINE SULFATE 4 MG/ML IJ SOLN
4.0000 mg | Freq: Once | INTRAMUSCULAR | Status: AC
  Administered 2011-06-28: 4 mg via INTRAVENOUS
  Filled 2011-06-28: qty 1

## 2011-06-28 MED ORDER — NEOMYCIN-COLIST-HC-THONZONIUM 3.3-3-10-0.5 MG/ML OT SUSP
4.0000 [drp] | Freq: Four times a day (QID) | OTIC | Status: DC
  Administered 2011-06-28: 4 [drp] via OTIC
  Filled 2011-06-28: qty 5

## 2011-06-28 MED ORDER — OXYCODONE-ACETAMINOPHEN 10-325 MG PO TABS
1.0000 | ORAL_TABLET | ORAL | Status: AC | PRN
Start: 2011-06-28 — End: 2011-07-08

## 2011-06-28 MED ORDER — CIPROFLOXACIN HCL 0.3 % OP SOLN
2.0000 [drp] | OPHTHALMIC | Status: DC
  Filled 2011-06-28: qty 2.5

## 2011-06-28 MED ORDER — CLINDAMYCIN PHOSPHATE 600 MG/50ML IV SOLN
600.0000 mg | Freq: Once | INTRAVENOUS | Status: AC
  Administered 2011-06-28: 600 mg via INTRAVENOUS
  Filled 2011-06-28: qty 50

## 2011-06-28 MED ORDER — SODIUM CHLORIDE 0.9 % IV BOLUS (SEPSIS)
1000.0000 mL | Freq: Once | INTRAVENOUS | Status: AC
  Administered 2011-06-28: 1000 mL via INTRAVENOUS

## 2011-06-28 MED ORDER — ONDANSETRON HCL 4 MG/2ML IJ SOLN
4.0000 mg | Freq: Once | INTRAMUSCULAR | Status: AC
  Administered 2011-06-28: 4 mg via INTRAVENOUS
  Filled 2011-06-28: qty 2

## 2011-06-28 MED ORDER — DIPHENHYDRAMINE HCL 50 MG/ML IJ SOLN
25.0000 mg | Freq: Once | INTRAMUSCULAR | Status: AC
  Administered 2011-06-28: 25 mg via INTRAVENOUS
  Filled 2011-06-28: qty 1

## 2011-06-28 MED ORDER — CLINDAMYCIN HCL 150 MG PO CAPS: 150.0000 mg | ORAL_CAPSULE | Freq: Four times a day (QID) | ORAL | Status: AC

## 2011-06-28 NOTE — ED Provider Notes (Addendum)
History     CSN: [id number]  Arrival date & time 06/27/11  2123   First MD Initiated Contact with Patient 06/28/11 0222      Chief Complaint  Patient presents with  . Headache  . Abscess    multiple  . Nausea    (Consider location/radiation/quality/duration/timing/severity/associated sxs/prior treatment) Patient is a 46 y.o. female presenting with headaches and abscess. The history is provided by the patient.  Headache  This is a recurrent problem. The current episode started 2 days ago. The problem occurs constantly. The problem has been gradually worsening. The headache is associated with bright light and loud noise. The pain is located in the frontal region. The quality of the pain is described as dull. The pain is at a severity of 5/10. The pain is moderate. The pain does not radiate. Associated symptoms include nausea. Pertinent negatives include no fever, no malaise/fatigue and no vomiting. She has tried nothing for the symptoms.  Abscess  This is a recurrent problem. The current episode started more than one week ago. The onset was gradual. The problem occurs continuously. The problem has been unchanged. The abscess is present on the torso and left upper leg. The problem is moderate. The abscess is characterized by itchiness and painfulness. 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 The abscess first occurred at home. Pertinent negatives include no fever and no vomiting. Her past medical history is significant for skin abscesses in family. There were no sick contacts. Recently, medical care has been given at this facility. Services Performed: keflex.    Past Medical History  Diagnosis Date  . Asthma   . GERD (gastroesophageal reflux disease)   . Hypertension   . Homelessness   . Low back pain   . 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 Tubal ligation   . Cesarean section     Family History  Problem Relation Age of Onset    . Asthma Mother   . Diabetes Father   . Heart disease Father   . Stroke Father     History  Substance Use Topics  . Smoking status: Former Smoker    Types: Cigarettes    Quit date: 03/25/2003  . Smokeless tobacco: Former Systems developer    Quit date: 05/27/1978  . Alcohol Use: No    OB History    Grav Para Term Preterm Abortions TAB SAB Ect Mult Living                  Review of Systems  Constitutional: Negative for fever and malaise/fatigue.  Gastrointestinal: Positive for nausea. Negative for vomiting.  Neurological: Positive for headaches.  All other systems reviewed and are negative.    Allergies  Azithromycin; Doxycycline; Ibuprofen; and Sulfamethoxazole w/trimethoprim  Home Medications   Current Outpatient Rx  Name Route Sig Dispense Refill  . ALBUTEROL SULFATE HFA 108 (90 BASE) MCG/ACT IN AERS Inhalation Inhale 1 puff into the lungs every 4 (four) hours as needed. For asthma relief    . ALBUTEROL SULFATE (2.5 MG/3ML) 0.083% IN NEBU Nebulization Take 3 mLs (2.5 mg total) by nebulization every 6 (six) hours as needed for wheezing. 75 mL 12  . ALLOPURINOL 100 MG PO TABS Oral Take 1 tablet (100 mg total) by mouth daily. 30 tablet 2  . ALPRAZOLAM 1 MG PO TABS Oral Take 1 mg by mouth 2 (two) times daily.    [name] AMITRIPTYLINE HCL 75 MG  PO TABS Oral Take 75 mg by mouth at bedtime.      . AMMONIUM LACTATE 12 % EX CREA Topical Apply topically 2 (two) times daily. In a thin layer on affected area     . ASPIRIN 325 MG PO TABS Oral Take 325 mg by mouth daily.    . ATORVASTATIN CALCIUM 20 MG PO TABS Oral Take 20 mg by mouth daily.    [name] BENZONATATE 100 MG PO CAPS Oral Take 100-200 mg by mouth 3 (three) times daily as needed. Cough    . CLONIDINE HCL 0.2 MG PO TABS Oral Take 1 tablet (0.2 mg total) by mouth 2 (two) times daily. 60 tablet 11  . DIPHENHYDRAMINE HCL 25 MG PO TABS Oral Take 2 tablets (50 mg total) by mouth every 4 (four) hours as needed for itching. 20 tablet 0  . EUCERIN EX  LOTN Topical Apply topically 2 (two) times daily. On affected areas     . FAMOTIDINE 20 MG PO TABS Oral Take 1 tablet (20 mg total) by mouth 2 (two) times daily. 30 tablet 0  . FLUTICASONE-SALMETEROL 250-50 MCG/DOSE IN AEPB Inhalation Inhale 1 puff into the lungs 2 (two) times daily. 1 each 6  . FUROSEMIDE 20 MG PO TABS Oral Take 20 mg by mouth daily.    . INDOMETHACIN ER 75 MG PO CPCR Oral Take 75 mg by mouth daily.    [name] KETOCONAZOLE 200 MG PO TABS Oral Take 200 mg by mouth daily.    [name] LISINOPRIL 20 MG PO TABS Oral Take 2 tablets (40 mg total) by mouth daily. 30 tablet 5  . LUBIPROSTONE 24 MCG PO CAPS Oral Take 24 mcg by mouth 2 (two) times daily with a meal.    . MICONAZOLE NITRATE 2 % EX CREA Topical Apply 1 application topically 2 (two) times daily. Apply to upper back region 28.35 g 1  . NAPROXEN 500 MG PO TABS Oral Take 500 mg by mouth 2 (two) times daily with a meal.    . PANTOPRAZOLE SODIUM 40 MG PO TBEC Oral Take 1 tablet (40 mg total) by mouth daily. 30 tablet 11  . SALICYLIC ACID 6 % EX SHAM  Apply to scalp 1 -2 times a week 177 mL 11  . SOLIFENACIN SUCCINATE 5 MG PO TABS Oral Take 1 tablet (5 mg total) by mouth daily. 30 tablet 11  . TRAZODONE HCL 50 MG PO TABS Oral Take 2 tablets (100 mg total) by mouth at bedtime. 60 tablet 3  . TRIAMCINOLONE ACETONIDE 0.1 % EX OINT Topical Apply topically 2 (two) times daily. Apply thin layer over rash       BP 128/76  Pulse 77  Temp(Src) 98.9 F (37.2 C) (Oral)  Resp 20  SpO2 94%  LMP 03/24/2009  Physical Exam  Nursing note and vitals reviewed. Constitutional: She is oriented to person, place, and time. She appears well-developed and well-nourished. No distress.       Morbidly obese female  HENT:  Head: Normocephalic and atraumatic.  Right Ear: There is drainage, swelling and tenderness. No mastoid tenderness. No middle ear effusion.  Eyes: EOM are normal. Pupils are equal, round, and reactive to light. Left eye exhibits chemosis and  discharge. Left conjunctiva is injected.  Cardiovascular: Normal rate, regular rhythm, normal heart sounds and intact distal pulses.  Exam reveals no friction rub.   No murmur heard. Pulmonary/Chest: Effort normal and breath sounds normal. She has no wheezes. She has no rales.  Abdominal:  Soft. Bowel sounds are normal. She exhibits no distension. There is no tenderness. There is no rebound and no guarding.  Musculoskeletal: Normal range of motion. She exhibits no tenderness.       No edema  Neurological: She is alert and oriented to person, place, and time. No cranial nerve deficit.  Skin: Skin is warm and dry. Rash noted.       Diffuse rash on the upper and lower extremities as well as the trunk. It is excoriated without overlying cellulitis. 2-3 small skin abscesses that are not pointing or fluctuance.  Psychiatric: She has a normal mood and affect. Her behavior is normal.    ED Course  Procedures (including critical care time)  Labs Reviewed  CBC - Abnormal; Notable for the following:    Hemoglobin 11.9 (*)    All other components within normal limits  COMPREHENSIVE METABOLIC PANEL - Abnormal; Notable for the following:    CO2 34 (*)    Albumin 3.3 (*)    Total Bilirubin 0.2 (*)    GFR calc non Af Amer 59 (*)    GFR calc Af Amer 68 (*)    All other components within normal limits  DIFFERENTIAL  URINALYSIS, ROUTINE W REFLEX MICROSCOPIC   Dg Chest 2 View  06/28/2011  *RADIOLOGY REPORT*  Clinical Data: Shortness of breath, chest pain  CHEST - 2 VIEW  Comparison: 05/27/2011  Findings: Low lung volumes with vascular crowding.  No frank interstitial edema.  No pleural effusion or pneumothorax.  Stable cardiomegaly.  Degenerative changes of the visualized thoracolumbar spine.  IMPRESSION: Low lung volumes with vascular crowding.  Stable cardiomegaly.  Original Report Authenticated By: Julian Hy, M.D.     1. Conjunctivitis   2. Otitis externa   3. Skin abscess       MDM    Patient with multiple complaints including a rash with recurrent abscesses, nausea, headache, cough. Patient states several meds ago she was admitted for pneumonia. On exam patient has no signs of abscess. She has multiple skin lesions were she's been scratching and scratched her skin off. She has a diffuse rash but there is no signs of cellulitis or abscess is present currently. Patient has normal vital signs is afebrile on exam. Her exam is otherwise benign.  Multiple times on exam patient states sometimes she has to get admitted for this however in examining the patient and looking at her normal lab work and chest x-ray do not feel that she needs admission today. She has been taking Keflex but she has no signs of cellulitis. She was given one dose of clindamycin do to recurrent abscesses. There are no drainable abscesses today.  Discussed results with patient and will give her clindamycin to continue taking to prevent recurrent abscesses. Will have her followup with her doctor for further care.  Also patient has otitis externa and conjunctivitis. She was given treatments for both of these.        Blanchie Dessert, MD 06/28/11 [place], MD 06/28/11 [phone]

## 2011-06-28 NOTE — Discharge Instructions (Signed)
Abscess An abscess (boil or furuncle) is an infected area under your skin. This area is filled with yellowish white fluid (pus). HOME CARE   Only take medicine as told by your doctor.   Keep the skin clean around your abscess. Keep clothes that may touch the abscess clean.   Change any bandages (dressings) as told by your doctor.   Avoid direct skin contact with other people. The infection can spread by skin contact with others.   Practice good hygiene and do not share personal care items.   Do not share athletic equipment, towels, or whirlpools. Shower after every practice or work out session.   If a draining area cannot be covered:   Do not play sports.   Children should not go to daycare until the wound has healed or until fluid (drainage) stops coming out of the wound.   See your doctor for a follow-up visit as told.  GET HELP RIGHT AWAY IF:   There is more pain, puffiness (swelling), and redness in the wound site.   There is fluid or bleeding from the wound site.   You have muscle aches, chills, fever, or feel sick.   You or your child has a temperature by mouth above 102 F (38.9 C), not controlled by medicine.   Your baby is older than 3 months with a rectal temperature of 102 F (38.9 C) or higher.  MAKE SURE YOU:   Understand these instructions.   Will watch your condition.   Will get help right away if you are not doing well or get worse.  Document Released: 08/27/2007 Document Revised: 02/27/2011 Document Reviewed: 08/27/2007 [facility] Patient Information 2012 [place].

## 2011-07-16 ENCOUNTER — Emergency Department (HOSPITAL_COMMUNITY)
Admission: EM | Admit: 2011-07-16 | Discharge: 2011-07-16 | Disposition: A | Discharge status: Home / Self Care | Payer: Medicaid Other | Attending: Emergency Medicine | Admitting: Emergency Medicine

## 2011-07-16 ENCOUNTER — Encounter (HOSPITAL_COMMUNITY): Payer: Self-pay | Admitting: *Deleted

## 2011-07-16 DIAGNOSIS — Z79899 Other long term (current) drug therapy: Secondary | ICD-10-CM | POA: Insufficient documentation

## 2011-07-16 DIAGNOSIS — H6091 Unspecified otitis externa, right ear: Secondary | ICD-10-CM

## 2011-07-16 DIAGNOSIS — J45909 Unspecified asthma, uncomplicated: Secondary | ICD-10-CM | POA: Insufficient documentation

## 2011-07-16 DIAGNOSIS — K219 Gastro-esophageal reflux disease without esophagitis: Secondary | ICD-10-CM | POA: Insufficient documentation

## 2011-07-16 DIAGNOSIS — H60399 Other infective otitis externa, unspecified ear: Secondary | ICD-10-CM | POA: Insufficient documentation

## 2011-07-16 DIAGNOSIS — I10 Essential (primary) hypertension: Secondary | ICD-10-CM | POA: Insufficient documentation

## 2011-07-16 MED ORDER — ANTIPYRINE-BENZOCAINE 5.4-1.4 % OT SOLN
3.0000 [drp] | Freq: Once | OTIC | Status: AC
  Administered 2011-07-16: 4 [drp] via OTIC
  Filled 2011-07-16: qty 10

## 2011-07-16 MED ORDER — BACITRACIN ZINC 500 UNIT/GM EX OINT
TOPICAL_OINTMENT | CUTANEOUS | Status: AC
  Administered 2011-07-16: 03:00:00 via TOPICAL
  Filled 2011-07-16: qty 15

## 2011-07-16 MED ORDER — MUPIROCIN CALCIUM 2 % NA OINT: TOPICAL_OINTMENT | NASAL | Status: DC

## 2011-07-16 MED ORDER — BACITRACIN 500 UNIT/GM EX OINT: 1.0000 "application " | TOPICAL_OINTMENT | Freq: Two times a day (BID) | CUTANEOUS | Status: DC

## 2011-07-16 MED ORDER — CIPROFLOXACIN-DEXAMETHASONE 0.3-0.1 % OT SUSP
4.0000 [drp] | OTIC | Status: AC
  Administered 2011-07-16: 4 [drp] via OTIC
  Filled 2011-07-16: qty 7.5

## 2011-07-16 NOTE — ED Notes (Signed)
Pt requesting something for pain explained that the MD would have to see her first.  Pt verbalized understanding

## 2011-07-16 NOTE — ED Notes (Signed)
Auralgan otic solution gtts placed in right ear.  Pt wanting to know if she is going to get something by mouth for pain.  Explained to her that this would help the pain

## 2011-07-16 NOTE — ED Notes (Signed)
Pt st's gtts did not help the pain, requesting something by mouth.

## 2011-07-16 NOTE — ED Notes (Signed)
Pt has been having right ear pain for 2 days and now is beginning to have left ear pain.  Pt has had some fever and chills with this.

## 2011-07-16 NOTE — ED Notes (Signed)
Pt c/o right ear pain and pain left of nose for 3-4 days.  Also st's when she stands up she feels lightheaded

## 2011-07-16 NOTE — Discharge Instructions (Signed)
Used a topical antibiotic 4 drops every 6 hours, may use the pain medication 4 drops every 8 hours, please wait at least 15 minutes between using these drops if used at the same time.  Otitis Externa Swimmer's ear (otitis externa) is an infection in the outer ear canal. It can be caused by a germ or a fungus. It may be caused by:  Swimming in dirty water.   Water that stays in the ear after swimming.  HOME CARE  Put drops in the ear canal as told by your doctor.   Only take medicine as told by your doctor.  GET HELP RIGHT AWAY IF:   You have a temperature by mouth above 102 F (38.9 C), not controlled by medicine.   There is ear pain after 3 days.  MAKE SURE YOU:   Understand these instructions.   Will watch this condition.   Will get help right away if you are not doing well or get worse.  Document Released: 08/27/2007 Document Revised: 02/27/2011 Document Reviewed: 08/27/2007 [facility] Patient Information 2012 [place].

## 2011-07-16 NOTE — ED Provider Notes (Signed)
History     CSN: [id number]  Arrival date & time 07/16/11  0005   First MD Initiated Contact with Patient 07/16/11 0106      Chief Complaint  Patient presents with  . Otalgia    (Consider location/radiation/quality/duration/timing/severity/associated sxs/prior treatment) HPI Comments: 46 year old female presents after having right ear pain for approximately 2 days. This was gradual in onset, gradually worsening and persistent. She has some associated left nasal tenderness but no redness fevers vomiting. He seems to get worse when she moves her right ureter, manipulates the auricle or the tragus. She denies swimming or bathing.  Patient is a 46 y.o. female presenting with ear pain. The history is provided by the patient and medical records.  Otalgia Pertinent negatives include no hearing loss, no vomiting and no neck pain.    Past Medical History  Diagnosis Date  . Asthma   . GERD (gastroesophageal reflux disease)   . Hypertension   . Homelessness   . Low back pain   . 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 Tubal ligation   . Cesarean section     Family History  Problem Relation Age of Onset  . Asthma Mother   . Diabetes Father   . Heart disease Father   . Stroke Father     History  Substance Use Topics  . Smoking status: Former Smoker    Types: Cigarettes    Quit date: 03/25/2003  . Smokeless tobacco: Former Systems developer    Quit date: 05/27/1978  . Alcohol Use: No    OB History    Grav Para Term Preterm Abortions TAB SAB Ect Mult Living                  Review of Systems  Constitutional: Negative for fever.  HENT: Positive for ear pain. Negative for hearing loss, facial swelling, neck pain and neck stiffness.   Gastrointestinal: Negative for nausea and vomiting.    Allergies  Azithromycin; Doxycycline; Ibuprofen; and Sulfamethoxazole w/trimethoprim  Home Medications   Current Outpatient Rx  Name  Route Sig Dispense Refill  . ALBUTEROL SULFATE HFA 108 (90 BASE) MCG/ACT IN AERS Inhalation Inhale 1 puff into the lungs every 4 (four) hours as needed. For asthma relief    . ALBUTEROL SULFATE (2.5 MG/3ML) 0.083% IN NEBU Nebulization Take 3 mLs (2.5 mg total) by nebulization every 6 (six) hours as needed for wheezing. 75 mL 12  . ALPRAZOLAM 1 MG PO TABS Oral Take 1 mg by mouth 2 (two) times daily. Anxiety and depression    . AMMONIUM LACTATE 12 % EX CREA Topical Apply topically 2 (two) times daily. In a thin layer on affected area     . ASPIRIN 325 MG PO TABS Oral Take 325 mg by mouth daily.    . ATORVASTATIN CALCIUM 20 MG PO TABS Oral Take 20 mg by mouth daily.    [name] CLINDAMYCIN HCL 150 MG PO CAPS Oral Take 300 mg by mouth 4 (four) times daily. For skin infection    . CLONIDINE HCL 0.2 MG PO TABS Oral Take 1 tablet (0.2 mg total) by mouth 2 (two) times daily. 60 tablet 11  . EUCERIN EX LOTN Topical Apply topically 2 (two) times daily. On affected areas     . FAMOTIDINE 20 MG PO TABS Oral Take 1 tablet (20 mg total) by mouth 2 (two) times daily. 30 tablet 0  .  FLUTICASONE-SALMETEROL 250-50 MCG/DOSE IN AEPB Inhalation Inhale 1 puff into the lungs 2 (two) times daily. 1 each 6  . FUROSEMIDE 40 MG PO TABS Oral Take 40 mg by mouth daily.    [name] KETOCONAZOLE 200 MG PO TABS Oral Take 200 mg by mouth daily.    . LUBIPROSTONE 24 MCG PO CAPS Oral Take 24 mcg by mouth 2 (two) times daily with a meal.    . MICONAZOLE NITRATE 2 % EX CREA Topical Apply 1 application topically 2 (two) times daily. Apply to upper back region 28.35 g 1  . NAPROXEN 500 MG PO TABS Oral Take 500 mg by mouth 2 (two) times daily with a meal. Pain and gout pain    . PANTOPRAZOLE SODIUM 40 MG PO TBEC Oral Take 40 mg by mouth daily.    [name] SALICYLIC ACID 6 % EX SHAM  Apply to scalp 1 -2 times a week 177 mL 11  . SOLIFENACIN SUCCINATE 5 MG PO TABS Oral Take 1 tablet (5 mg total) by mouth daily. 30 tablet 11  . TRAZODONE HCL 50 MG PO TABS  Oral Take 2 tablets (100 mg total) by mouth at bedtime. 60 tablet 3  . TRIAMCINOLONE ACETONIDE 0.1 % EX OINT Topical Apply topically 2 (two) times daily. Apply thin layer over rash     . ALLOPURINOL 100 MG PO TABS Oral Take 1 tablet (100 mg total) by mouth daily. 30 tablet 2  . LISINOPRIL 20 MG PO TABS Oral Take 2 tablets (40 mg total) by mouth daily. 30 tablet 5  . MUPIROCIN CALCIUM 2 % NA OINT  Apply in each nostril daily 1 g 1  . PANTOPRAZOLE SODIUM 40 MG PO TBEC Oral Take 1 tablet (40 mg total) by mouth daily. 30 tablet 11    BP 125/87  Pulse 78  Temp(Src) 99 F (37.2 C) (Oral)  Resp 20  SpO2 96%  Physical Exam  Nursing note and vitals reviewed. Constitutional: She appears well-developed and well-nourished. No distress.  HENT:  Head: Normocephalic and atraumatic.  Left Ear: External ear normal.  Nose: Nose normal.  Mouth/Throat: Oropharynx is clear and moist. No oropharyngeal exudate.       External right ear with disclamation and edema of the external canal, tenderness with palpation around the ear as well as manipulation of the tragus and auricle. Tympanic membrane appears clear on the right, tympanic membrane appears clear on the left  Eyes: Conjunctivae are normal. Right eye exhibits no discharge. Left eye exhibits no discharge. No scleral icterus.  Neck: Normal range of motion. Neck supple.  Lymphadenopathy:    She has no cervical adenopathy.  Neurological: She is alert. Coordination normal.  Skin: Skin is warm and dry. She is not diaphoretic.    ED Course  Procedures (including critical care time)  Labs Reviewed - No data to display No results found.   1. Otitis externa of right ear       MDM  Patient is in no acute distress, has findings consistent with otitis externa of the right ear. Has been given antibiotic drops as well as antipyrine benzocaine drops for the right ear. He topical antibiotics to the left intranasal passage, with Bactroban for same. There is  noted. Any signs of facial cellulitis or cellulitis of the nose, there is no intranasal abscess present at this time. Vital signs are stable  Discharge Prescriptions include:  Ciprodex Auralgan Bactroban        Johnna Acosta, MD 07/16/11 [phone]

## 2011-07-31 ENCOUNTER — Emergency Department (HOSPITAL_COMMUNITY)
Admission: EM | Admit: 2011-07-31 | Discharge: 2011-08-01 | Disposition: A | Discharge status: Home / Self Care | Payer: Medicaid Other | Attending: Emergency Medicine | Admitting: Emergency Medicine

## 2011-07-31 ENCOUNTER — Encounter (HOSPITAL_COMMUNITY): Payer: Self-pay | Admitting: Emergency Medicine

## 2011-07-31 DIAGNOSIS — IMO0002 Reserved for concepts with insufficient information to code with codable children: Secondary | ICD-10-CM | POA: Insufficient documentation

## 2011-07-31 DIAGNOSIS — K219 Gastro-esophageal reflux disease without esophagitis: Secondary | ICD-10-CM | POA: Insufficient documentation

## 2011-07-31 DIAGNOSIS — I10 Essential (primary) hypertension: Secondary | ICD-10-CM | POA: Insufficient documentation

## 2011-07-31 DIAGNOSIS — L0291 Cutaneous abscess, unspecified: Secondary | ICD-10-CM

## 2011-07-31 DIAGNOSIS — J45909 Unspecified asthma, uncomplicated: Secondary | ICD-10-CM | POA: Insufficient documentation

## 2011-07-31 DIAGNOSIS — Z79899 Other long term (current) drug therapy: Secondary | ICD-10-CM | POA: Insufficient documentation

## 2011-07-31 NOTE — ED Notes (Signed)
PT. REPORTS ABSCESS " BOIL" AT RIGHT FOREARM WITH DRAINAGE FOR 4 DAYS.

## 2011-08-01 MED ORDER — OXYCODONE-ACETAMINOPHEN 5-325 MG PO TABS
1.0000 | ORAL_TABLET | Freq: Once | ORAL | Status: AC
  Administered 2011-08-01: 1 via ORAL
  Filled 2011-08-01: qty 1

## 2011-08-01 MED ORDER — LIDOCAINE HCL (PF) 1 % IJ SOLN
5.0000 mL | Freq: Once | INTRAMUSCULAR | Status: AC
  Administered 2011-08-01: 5 mL via INTRADERMAL

## 2011-08-01 MED ORDER — CLINDAMYCIN HCL 300 MG PO CAPS
300.0000 mg | ORAL_CAPSULE | Freq: Once | ORAL | Status: AC
  Administered 2011-08-01: 300 mg via ORAL
  Filled 2011-08-01: qty 1

## 2011-08-01 MED ORDER — TRAMADOL HCL 50 MG PO TABS
50.0000 mg | ORAL_TABLET | Freq: Once | ORAL | Status: AC
  Administered 2011-08-01: 50 mg via ORAL
  Filled 2011-08-01: qty 1

## 2011-08-01 MED ORDER — CLINDAMYCIN HCL 150 MG PO CAPS: 300.0000 mg | ORAL_CAPSULE | Freq: Three times a day (TID) | ORAL | Status: AC

## 2011-08-01 NOTE — Discharge Instructions (Signed)

## 2011-08-01 NOTE — ED Provider Notes (Signed)
History     CSN: [id number]  Arrival date & time 07/31/11  2355   First MD Initiated Contact with Patient 08/01/11 0059      Chief Complaint  Patient presents with  . Abscess    (Consider location/radiation/quality/duration/timing/severity/associated sxs/prior treatment) HPI The patient presents with concerns over an increasingly erythematous lesion on her right forearm.  She notes over the past 4 days there has been an increase only read, irritated, painful lesion on her right dorsal forearm.  She has a history of multiple prior similar lesions diffusely.  Over these days she's had no new confusion, no new disorientation, dyspnea, fever, chills, nausea, vomiting, diarrhea.  Symptoms been minimally controlled with OTC medication and oral narcotics the patient takes for chronic pain. Past Medical History  Diagnosis Date  . Asthma   . GERD (gastroesophageal reflux disease)   . Hypertension   . Homelessness   . Low back pain   . 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 Tubal ligation   . Cesarean section     Family History  Problem Relation Age of Onset  . Asthma Mother   . Diabetes Father   . Heart disease Father   . Stroke Father     History  Substance Use Topics  . Smoking status: Former Smoker    Types: Cigarettes    Quit date: 03/25/2003  . Smokeless tobacco: Former Systems developer    Quit date: 05/27/1978  . Alcohol Use: No    OB History    Grav Para Term Preterm Abortions TAB SAB Ect Mult Living                  Review of Systems  All other systems reviewed and are negative.    Allergies  Azithromycin; Doxycycline; Ibuprofen; and Sulfamethoxazole w-trimethoprim  Home Medications   Current Outpatient Rx  Name Route Sig Dispense Refill  . ALBUTEROL SULFATE HFA 108 (90 BASE) MCG/ACT IN AERS Inhalation Inhale 1 puff into the lungs every 4 (four) hours as needed. For asthma relief    . ALBUTEROL SULFATE (2.5  MG/3ML) 0.083% IN NEBU Nebulization Take 3 mLs (2.5 mg total) by nebulization every 6 (six) hours as needed for wheezing. 75 mL 12  . ALLOPURINOL 100 MG PO TABS Oral Take 1 tablet (100 mg total) by mouth daily. 30 tablet 2  . ALPRAZOLAM 1 MG PO TABS Oral Take 1 mg by mouth 2 (two) times daily. Anxiety and depression    . AMMONIUM LACTATE 12 % EX CREA Topical Apply topically 2 (two) times daily. In a thin layer on affected area     . ASPIRIN 325 MG PO TABS Oral Take 325 mg by mouth daily.    . ATORVASTATIN CALCIUM 20 MG PO TABS Oral Take 20 mg by mouth daily.    [name] CLONIDINE HCL 0.2 MG PO TABS Oral Take 1 tablet (0.2 mg total) by mouth 2 (two) times daily. 60 tablet 11  . EUCERIN EX LOTN Topical Apply topically 2 (two) times daily. On affected areas     . FAMOTIDINE 20 MG PO TABS Oral Take 1 tablet (20 mg total) by mouth 2 (two) times daily. 30 tablet 0  . FLUTICASONE-SALMETEROL 250-50 MCG/DOSE IN AEPB Inhalation Inhale 1 puff into the lungs 2 (two) times daily. 1 each 6  . FUROSEMIDE 40 MG PO TABS Oral Take 40 mg by mouth daily.    [name]  HYDROCODONE-ACETAMINOPHEN 10-325 MG PO TABS Oral Take 1 tablet by mouth every 6 (six) hours as needed. For pain    . LISINOPRIL 20 MG PO TABS Oral Take 2 tablets (40 mg total) by mouth daily. 30 tablet 5  . LUBIPROSTONE 24 MCG PO CAPS Oral Take 24 mcg by mouth 2 (two) times daily with a meal.    . NAPROXEN 500 MG PO TABS Oral Take 500 mg by mouth 2 (two) times daily with a meal. Pain and gout pain    . PANTOPRAZOLE SODIUM 40 MG PO TBEC Oral Take 40 mg by mouth daily.    [name] SOLIFENACIN SUCCINATE 5 MG PO TABS Oral Take 1 tablet (5 mg total) by mouth daily. 30 tablet 11  . TRAZODONE HCL 50 MG PO TABS Oral Take 2 tablets (100 mg total) by mouth at bedtime. 60 tablet 3  . CLINDAMYCIN HCL 150 MG PO CAPS Oral Take 2 capsules (300 mg total) by mouth 3 (three) times daily. 21 capsule 0    BP 134/88  Pulse 84  Temp(Src) 98.9 F (37.2 C) (Oral)  Resp 20  SpO2  93%  Physical Exam  Nursing note and vitals reviewed. Constitutional: She is oriented to person, place, and time.       Morbidly obese female in electric cart.  No distress  HENT:  Head: Normocephalic and atraumatic.  Eyes: Conjunctivae and EOM are normal.  Cardiovascular: Normal rate and regular rhythm.   Pulmonary/Chest: Effort normal. No stridor.       Lung sounds are poorly auscultated secondary to habitus  Abdominal:       Obese  Neurological: She is alert and oriented to person, place, and time. No cranial nerve deficit.  Skin: She is not diaphoretic.     Psychiatric: She has a normal mood and affect.    ED Course  INCISION AND DRAINAGE Date/Time: 08/01/2011 1:50 AM Performed by: Carmin Muskrat Authorized by: Carmin Muskrat Consent: Verbal consent obtained. The procedure was performed in an emergent situation. Risks and benefits: risks, benefits and alternatives were discussed Consent given by: patient Patient identity confirmed: verbally with patient Time out: Immediately prior to procedure a "time out" was called to verify the correct patient, procedure, equipment, support staff and site/side marked as required. Type: abscess Body area: upper extremity Location details: right arm Anesthesia: local infiltration Local anesthetic: lidocaine 2% without epinephrine Anesthetic total: 2 ml Patient sedated: no Scalpel size: 11 Needle gauge: 22 Incision type: single straight Complexity: simple Drainage: serosanguinous Drainage amount: moderate Wound treatment: wound left open Packing material: 1/2 in gauze Patient tolerance: Patient tolerated the procedure well with no immediate complications.   (including critical care time)  Labs Reviewed - No data to display No results found.   1. Abscess       MDM  This morbidly obese female presents with concerns over a right forearm lesion.  On exam the patient is in no distress, with unremarkable vital signs.  The  patient has a lesion consistent with abscess.  Given the patient's description of spreading erythema, increasing pain him in the central area of fluctuance, the patient's wound was incised and drained.  This procedure was well tolerated.  The patient was provided a dose of oral antibiotics prior to discharge.  The patient requests narcotic pain medication for her chronic pain.  I discussed the prohibition against emergency department provision of narcotics for chronic pain.  She was accommodated with a single Percocet prior to discharge  Carmin Muskrat, MD 08/01/11 [phone]

## 2011-08-01 NOTE — ED Notes (Signed)
Clindamycin ordered from pharmacy

## 2011-08-01 NOTE — ED Notes (Signed)
Pt aware that we are waiting for clindamycin from pharmacy.

## 2011-08-01 NOTE — ED Notes (Signed)
MD at bedside. 

## 2011-08-18 ENCOUNTER — Emergency Department (HOSPITAL_COMMUNITY)
Admission: EM | Admit: 2011-08-18 | Discharge: 2011-08-19 | Disposition: A | Discharge status: Home / Self Care | Payer: Medicaid Other | Attending: Emergency Medicine | Admitting: Emergency Medicine

## 2011-08-18 ENCOUNTER — Encounter (HOSPITAL_COMMUNITY): Payer: Self-pay | Admitting: Emergency Medicine

## 2011-08-18 DIAGNOSIS — L738 Other specified follicular disorders: Secondary | ICD-10-CM | POA: Insufficient documentation

## 2011-08-18 DIAGNOSIS — J45909 Unspecified asthma, uncomplicated: Secondary | ICD-10-CM | POA: Insufficient documentation

## 2011-08-18 DIAGNOSIS — K219 Gastro-esophageal reflux disease without esophagitis: Secondary | ICD-10-CM | POA: Insufficient documentation

## 2011-08-18 DIAGNOSIS — Z79899 Other long term (current) drug therapy: Secondary | ICD-10-CM | POA: Insufficient documentation

## 2011-08-18 DIAGNOSIS — L739 Follicular disorder, unspecified: Secondary | ICD-10-CM

## 2011-08-18 DIAGNOSIS — I10 Essential (primary) hypertension: Secondary | ICD-10-CM | POA: Insufficient documentation

## 2011-08-18 NOTE — ED Notes (Signed)
Pt presents with rash noted on her legs, back, and some on her arms  Pt states she also has boils in her groin area  Pt states she has been using hot compresses on them and has gotten some drainage out of them  Pt states she has been hospitalized in the past for the rash

## 2011-08-19 LAB — CBC
MCHC: 30.5 g/dL (ref 30.0–36.0)
Platelets: 357 10*3/uL (ref 150–400)
RDW: 14.1 % (ref 11.5–15.5)
WBC: 10.5 10*3/uL (ref 4.0–10.5)

## 2011-08-19 LAB — BASIC METABOLIC PANEL
CO2: 29 mEq/L (ref 19–32)
Calcium: 8.4 mg/dL (ref 8.4–10.5)
Chloride: 101 mEq/L (ref 96–112)
GFR calc Af Amer: 76 mL/min — ABNORMAL LOW (ref >90)
Sodium: 142 mEq/L (ref 135–145)

## 2011-08-19 LAB — DIFFERENTIAL
Basophils Absolute: 0 10*3/uL (ref 0.0–0.1)
Basophils Relative: 0 % (ref 0–1)
Eosinophils Relative: 3 % (ref 0–5)
Lymphocytes Relative: 26 % (ref 12–46)
Neutro Abs: 6.9 10*3/uL (ref 1.7–7.7)

## 2011-08-19 MED ORDER — OXYCODONE-ACETAMINOPHEN 5-325 MG PO TABS: 1.0000 | ORAL_TABLET | Freq: Four times a day (QID) | ORAL | Status: AC | PRN

## 2011-08-19 MED ORDER — CLINDAMYCIN PHOSPHATE 900 MG/50ML IV SOLN
900.0000 mg | Freq: Once | INTRAVENOUS | Status: AC
  Administered 2011-08-19: 900 mg via INTRAVENOUS
  Filled 2011-08-19: qty 50

## 2011-08-19 MED ORDER — MUPIROCIN CALCIUM 2 % EX CREA: TOPICAL_CREAM | Freq: Three times a day (TID) | CUTANEOUS | Status: AC

## 2011-08-19 MED ORDER — MORPHINE SULFATE 2 MG/ML IJ SOLN
2.0000 mg | Freq: Once | INTRAMUSCULAR | Status: AC
  Administered 2011-08-19: 2 mg via INTRAVENOUS
  Filled 2011-08-19: qty 1

## 2011-08-19 MED ORDER — DIPHENHYDRAMINE HCL 50 MG/ML IJ SOLN
25.0000 mg | Freq: Once | INTRAMUSCULAR | Status: AC
  Administered 2011-08-19: 25 mg via INTRAVENOUS
  Filled 2011-08-19: qty 1

## 2011-08-19 MED ORDER — CLINDAMYCIN HCL 300 MG PO CAPS: 300.0000 mg | ORAL_CAPSULE | Freq: Four times a day (QID) | ORAL | Status: AC

## 2011-08-19 NOTE — ED Notes (Signed)
Patient c/o pain and itching to rash on legs.

## 2011-08-19 NOTE — Discharge Instructions (Signed)
You were seen and evaluated for your complaints of rash and skin pain. At this time your providers feel your symptoms are caused from a folliculitis infection. Please take antibiotics are prescribed as instructed for the full length of time. Use a topical antibiotic over the area as instructed. Please followup with your primary care provider. Return to the emergency room for any worsening symptoms, fever, chills, sweats.    Folliculitis  Folliculitis is an infection and inflammation of the hair follicles. Hair follicles become red and irritated. This inflammation is usually caused by bacteria. The bacteria thrive in warm, moist environments. This condition can be seen anywhere on the body.  CAUSES The most common cause of folliculitis is an infection by germs (bacteria). Fungal and viral infections can also cause the condition. Viral infections may be more common in people whose bodies are unable to fight disease well (weakened immune systems). Examples include people with:  AIDS.   An organ transplant.   Cancer.  People with depressed immune systems, diabetes, or obesity, have a greater risk of getting folliculitis than the general population. Certain chemicals, especially oils and tars, also can cause folliculitis. SYMPTOMS  An early sign of folliculitis is a small, white or yellow pus-filled, itchy lesion (pustule). These lesions appear on a red, inflamed follicle. They are usually less than 5 mm (.20 inches).   The most likely starting points are the scalp, thighs, legs, back and buttocks. Folliculitis is also frequently found in areas of repeated shaving.   When an infection of the follicle goes deeper, it becomes a boil or furuncle. A group of closely packed boils create a larger lesion (a carbuncle). These sores (lesions) tend to occur in hairy, sweaty areas of the body.  TREATMENT   A doctor who specializes in skin problems (dermatologists) treats mild cases of folliculitis with  antiseptic washes.   They also use a skin application which kills germs (topical antibiotics). Tea tree oil is a good topical antiseptic as well. It can be found at a health food store. A small percentage of individuals may develop an allergy to the tea tree oil.   Mild to moderate boils respond well to warm water compresses applied three times daily.   In some cases, oral antibiotics should be taken with the skin treatment.   If lesions contain large quantities of pus or fluid, your caregiver may drain them. This allows the topical antibiotics to get to the affected areas better.   Stubborn cases of folliculitis may respond to laser hair removal. This process uses a high intensity light beam (a laser) to destroy the follicle and reduces the scarring from folliculitis. After laser hair removal, hair will no longer grow in the laser treated area.  Patients with long-lasting folliculitis need to find out where the infection is coming from. Germs can live in the nostrils of the patient. This can trigger an outbreak now and then. Sometimes the bacteria live in the nostrils of a family member. This person does not develop the disorder but they repeatedly re-expose others to the germ. To break the cycle of recurrence in the patient, the family member must also undergo treatment. PREVENTION   Individuals who are predisposed to folliculitis should be extremely careful about personal hygiene.   Application of antiseptic washes may help prevent recurrences.   A topical antibiotic cream, mupirocin (Bactroban), has been effective at reducing bacteria in the nostrils. It is applied inside the nose with your little finger. This is done twice  daily for a week. Then it is repeated every 6 months.   Because follicle disorders tend to come back, patients must receive follow-up care. Your caregiver may be able to recognize a recurrence before it becomes severe.  SEEK IMMEDIATE MEDICAL CARE IF:   You develop  redness, swelling, or increasing pain in the area.   You have a fever.   You are not improving with treatment or are getting worse.   You have any other questions or concerns.  Document Released: 05/19/2001 Document Revised: 02/27/2011 Document Reviewed: 03/15/2008 [facility] Patient Information 2012 [place].    RESOURCE GUIDE  Dental Problems  Patients with Medicaid: [facility] [place] [facility] Phone:  [phone]                                                  Phone:  [phone]  If unable to pay or uninsured, contact:  [facility] or [facility]. to become qualified for the adult dental clinic.  Chronic Pain Problems Contact [facility]  [phone] Patients need to be referred by their primary care doctor.  Insufficient Money for Medicine Contact [employer]:  call "211" or [place] [phone].  No Primary Care Doctor Call [facility]  [phone] Other agencies that provide inexpensive medical care    [place]  [phone]    [facility]  [place]  [phone]    [facility]  [phone]    [facility]  [place]  [place]  [phone] [place]   [phone] (emergency services [phone])  Substance Abuse [facility]  [phone] [facility] [phone] [facility] [phone] [facility]inita Pester [phone] Residential & Outpatient Substance Abuse Program  [phone]  Abuse/Neglect [place] [phone] [place] [phone] (After Hours)  Emergency [place] [phone]  [facility] [place] [phone] [place] [phone]  MRSA Hotline #:   657-253-4841    [facility] Dept. [address] [place]      [place]  Sela Hua Phone:  [phone]                                   Phone:  [phone]                 Phone:  [place] Phone:  [place] [phone] (After Hours)

## 2011-08-19 NOTE — ED Provider Notes (Signed)
History     CSN: [id number]  Arrival date & time 08/18/11  2321   First MD Initiated Contact with Patient 08/19/11 0140      Chief Complaint  Patient presents with  . Rash  . Abscess   HPI  History provided by the patient. Patient is a 46 year old female with history of hypertension, asthma and morbid obesity who presents with complaints of painful rash to lower extremities. Patient reports first developing rash 2 days ago. Rash is described as small bumps with associated pain. Patient also complains of a larger or tender area to left groin similar to prior abscess infections in the past. Patient does report recent bathing in a tub. She denies having similar diffuse rash such as this in the past she does report having other skin rash and infection problems in the past. She reports that time having a diffuse area of redness and soreness with increased pain from her symptoms today. Patient denies any other aggravating or alleviating factors. Patient denies any other associated symptom. She denies any fever, chills, sweats, nausea vomiting.     Past Medical History  Diagnosis Date  . Asthma   . GERD (gastroesophageal reflux disease)   . Hypertension   . Homelessness   . Low back pain   . 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 Tubal ligation   . Cesarean section     Family History  Problem Relation Age of Onset  . Asthma Mother   . Diabetes Father   . Heart disease Father   . Stroke Father     History  Substance Use Topics  . Smoking status: Former Smoker    Types: Cigarettes    Quit date: 03/25/2003  . Smokeless tobacco: Former Systems developer    Quit date: 05/27/1978  . Alcohol Use: No    OB History    Grav Para Term Preterm Abortions TAB SAB Ect Mult Living                  Review of Systems  Constitutional: Negative for fever and chills.  Respiratory: Negative for cough and shortness of breath.     Cardiovascular: Negative for chest pain.  Gastrointestinal: Negative for abdominal pain.  Skin: Positive for rash.    Allergies  Azithromycin; Doxycycline; Ibuprofen; and Sulfamethoxazole w-trimethoprim  Home Medications   Current Outpatient Rx  Name Route Sig Dispense Refill  . ALBUTEROL SULFATE HFA 108 (90 BASE) MCG/ACT IN AERS Inhalation Inhale 1 puff into the lungs every 4 (four) hours as needed. For asthma relief    . ALBUTEROL SULFATE (2.5 MG/3ML) 0.083% IN NEBU Nebulization Take 3 mLs (2.5 mg total) by nebulization every 6 (six) hours as needed for wheezing. 75 mL 12  . ALLOPURINOL 100 MG PO TABS Oral Take 1 tablet (100 mg total) by mouth daily. 30 tablet 2  . ALPRAZOLAM 1 MG PO TABS Oral Take 1 mg by mouth 2 (two) times daily. Anxiety and depression    . AMMONIUM LACTATE 12 % EX CREA Topical Apply topically 2 (two) times daily. In a thin layer on affected area     . ASPIRIN 325 MG PO TABS Oral Take 325 mg by mouth daily.    . ATORVASTATIN CALCIUM 20 MG PO TABS Oral Take 20 mg by mouth daily.    [name] CLONIDINE HCL 0.2 MG PO TABS Oral Take 1 tablet (0.2 mg total) by mouth  2 (two) times daily. 60 tablet 11  . DIPHENHYDRAMINE HCL 25 MG PO CAPS Oral Take 25 mg by mouth every 6 (six) hours as needed.    Ree Kida EX LOTN Topical Apply topically 2 (two) times daily. On affected areas     . FAMOTIDINE 20 MG PO TABS Oral Take 1 tablet (20 mg total) by mouth 2 (two) times daily. 30 tablet 0  . FUROSEMIDE 40 MG PO TABS Oral Take 40 mg by mouth daily.    [name] HYDROCODONE-ACETAMINOPHEN 10-325 MG PO TABS Oral Take 1 tablet by mouth every 6 (six) hours as needed. For pain    . KETOCONAZOLE 200 MG PO TABS Oral Take 200 mg by mouth daily.    [name] LISINOPRIL 20 MG PO TABS Oral Take 2 tablets (40 mg total) by mouth daily. 30 tablet 5  . LUBIPROSTONE 24 MCG PO CAPS Oral Take 24 mcg by mouth 2 (two) times daily with a meal.    . NAPROXEN 500 MG PO TABS Oral Take 500 mg by mouth 2 (two) times daily with a  meal. Pain and gout pain    . PANTOPRAZOLE SODIUM 40 MG PO TBEC Oral Take 40 mg by mouth daily.    [name] SOLIFENACIN SUCCINATE 5 MG PO TABS Oral Take 1 tablet (5 mg total) by mouth daily. 30 tablet 11  . TRAZODONE HCL 50 MG PO TABS Oral Take 2 tablets (100 mg total) by mouth at bedtime. 60 tablet 3  . DIPHENHYDRAMINE HCL 25 MG PO TABS Oral Take 2 tablets (50 mg total) by mouth every 4 (four) hours as needed for itching. 20 tablet 0    BP 146/70  Pulse 76  Temp(Src) 98.8 F (37.1 C) (Oral)  Resp 18  SpO2 96%  LMP 03/24/2009  Physical Exam  Nursing note and vitals reviewed. Constitutional: She is oriented to person, place, and time. She appears well-developed and well-nourished. No distress.  HENT:  Head: Normocephalic and atraumatic.  Cardiovascular: Normal rate and regular rhythm.   Pulmonary/Chest: Effort normal and breath sounds normal. No respiratory distress. She has no wheezes.  Abdominal: Soft.       Morbidly obese  Neurological: She is alert and oriented to person, place, and time.  Skin: Skin is warm and dry. Rash noted.       Chaperone was present  2 mm papular and occasionally pustular rash on bilateral medial lower leg this over hair follicle areas. No confluent areas of erythema. No induration the skin. There is a 2 cm tender nodular area with mild erythema to left groin and labia area. Slight purulent drainage.  Psychiatric: She has a normal mood and affect. Her behavior is normal.    ED Course  Procedures   Results for orders placed during the hospital encounter of 08/18/11  CBC      Component Value Range   WBC 10.5  4.0 - 10.5 (K/uL)   RBC 4.64  3.87 - 5.11 (MIL/uL)   Hemoglobin 13.0  12.0 - 15.0 (g/dL)   HCT 42.6  36.0 - 46.0 (%)   MCV 91.8  78.0 - 100.0 (fL)   MCH 28.0  26.0 - 34.0 (pg)   MCHC 30.5  30.0 - 36.0 (g/dL)   RDW 14.1  11.5 - 15.5 (%)   Platelets 357  150 - 400 (K/uL)  DIFFERENTIAL      Component Value Range   Neutrophils Relative 65  43 - 77  (%)   Neutro Abs 6.9  1.7 -  7.7 (K/uL)   Lymphocytes Relative 26  12 - 46 (%)   Lymphs Abs 2.7  0.7 - 4.0 (K/uL)   Monocytes Relative 6  3 - 12 (%)   Monocytes Absolute 0.6  0.1 - 1.0 (K/uL)   Eosinophils Relative 3  0 - 5 (%)   Eosinophils Absolute 0.3  0.0 - 0.7 (K/uL)   Basophils Relative 0  0 - 1 (%)   Basophils Absolute 0.0  0.0 - 0.1 (K/uL)  BASIC METABOLIC PANEL      Component Value Range   Sodium 142  135 - 145 (mEq/L)   Potassium 4.0  3.5 - 5.1 (mEq/L)   Chloride 101  96 - 112 (mEq/L)   CO2 29  19 - 32 (mEq/L)   Glucose, Bld 95  70 - 99 (mg/dL)   BUN 18  6 - 23 (mg/dL)   Creatinine, Ser 1.01  0.50 - 1.10 (mg/dL)   Calcium 8.4  8.4 - 10.5 (mg/dL)   GFR calc non Af Amer 65 (*) >90 (mL/min)   GFR calc Af Amer 76 (*) >90 (mL/min)      1. Folliculitis       MDM  Patient seen and evaluated. Patient no acute distress.    Rash consistent with folliculitis. No signs for cellulitis or deep infection. At this time will place patient on antibiotics and topical antibiotic cream. Patient to have a followup with her PCP or return for any worsening symptoms.    Martie Lee, [place] 08/19/11 2310

## 2011-08-21 NOTE — ED Provider Notes (Signed)
Medical screening examination/treatment/procedure(s) were performed by non-physician practitioner and as supervising physician I was immediately available for consultation/collaboration.   Trisha Mangle, MD 08/21/11 [phone]

## 2011-08-24 ENCOUNTER — Encounter (HOSPITAL_COMMUNITY): Payer: Self-pay | Admitting: Emergency Medicine

## 2011-08-24 ENCOUNTER — Inpatient Hospital Stay (HOSPITAL_COMMUNITY)
Admission: EM | Admit: 2011-08-24 | Discharge: 2011-08-27 | DRG: 683 | Disposition: A | Discharge status: Home Health | Payer: Medicaid Other | Attending: Internal Medicine | Admitting: Internal Medicine

## 2011-08-24 ENCOUNTER — Emergency Department (HOSPITAL_COMMUNITY): Payer: Medicaid Other

## 2011-08-24 DIAGNOSIS — L739 Follicular disorder, unspecified: Secondary | ICD-10-CM | POA: Diagnosis present

## 2011-08-24 DIAGNOSIS — Z6841 Body Mass Index (BMI) 40.0 and over, adult: Secondary | ICD-10-CM

## 2011-08-24 DIAGNOSIS — R112 Nausea with vomiting, unspecified: Secondary | ICD-10-CM

## 2011-08-24 DIAGNOSIS — N3941 Urge incontinence: Secondary | ICD-10-CM

## 2011-08-24 DIAGNOSIS — E860 Dehydration: Secondary | ICD-10-CM

## 2011-08-24 DIAGNOSIS — J189 Pneumonia, unspecified organism: Secondary | ICD-10-CM

## 2011-08-24 DIAGNOSIS — N179 Acute kidney failure, unspecified: Secondary | ICD-10-CM

## 2011-08-24 DIAGNOSIS — L0292 Furuncle, unspecified: Secondary | ICD-10-CM

## 2011-08-24 DIAGNOSIS — I959 Hypotension, unspecified: Secondary | ICD-10-CM

## 2011-08-24 DIAGNOSIS — Z91199 Patient's noncompliance with other medical treatment and regimen due to unspecified reason: Secondary | ICD-10-CM

## 2011-08-24 DIAGNOSIS — R7989 Other specified abnormal findings of blood chemistry: Secondary | ICD-10-CM

## 2011-08-24 DIAGNOSIS — L0293 Carbuncle, unspecified: Secondary | ICD-10-CM

## 2011-08-24 DIAGNOSIS — Q828 Other specified congenital malformations of skin: Secondary | ICD-10-CM

## 2011-08-24 DIAGNOSIS — K219 Gastro-esophageal reflux disease without esophagitis: Secondary | ICD-10-CM

## 2011-08-24 DIAGNOSIS — G8929 Other chronic pain: Secondary | ICD-10-CM | POA: Diagnosis present

## 2011-08-24 DIAGNOSIS — L738 Other specified follicular disorders: Secondary | ICD-10-CM

## 2011-08-24 DIAGNOSIS — R51 Headache: Secondary | ICD-10-CM

## 2011-08-24 DIAGNOSIS — R5381 Other malaise: Secondary | ICD-10-CM | POA: Diagnosis present

## 2011-08-24 DIAGNOSIS — R42 Dizziness and giddiness: Secondary | ICD-10-CM

## 2011-08-24 DIAGNOSIS — Z9119 Patient's noncompliance with other medical treatment and regimen: Secondary | ICD-10-CM

## 2011-08-24 DIAGNOSIS — M549 Dorsalgia, unspecified: Secondary | ICD-10-CM

## 2011-08-24 DIAGNOSIS — G4700 Insomnia, unspecified: Secondary | ICD-10-CM

## 2011-08-24 DIAGNOSIS — I10 Essential (primary) hypertension: Secondary | ICD-10-CM

## 2011-08-24 DIAGNOSIS — N289 Disorder of kidney and ureter, unspecified: Secondary | ICD-10-CM

## 2011-08-24 DIAGNOSIS — M1710 Unilateral primary osteoarthritis, unspecified knee: Secondary | ICD-10-CM

## 2011-08-24 DIAGNOSIS — G4733 Obstructive sleep apnea (adult) (pediatric): Secondary | ICD-10-CM

## 2011-08-24 DIAGNOSIS — Z993 Dependence on wheelchair: Secondary | ICD-10-CM

## 2011-08-24 DIAGNOSIS — IMO0002 Reserved for concepts with insufficient information to code with codable children: Secondary | ICD-10-CM

## 2011-08-24 DIAGNOSIS — J45909 Unspecified asthma, uncomplicated: Secondary | ICD-10-CM

## 2011-08-24 DIAGNOSIS — R1110 Vomiting, unspecified: Secondary | ICD-10-CM

## 2011-08-24 DIAGNOSIS — E662 Morbid (severe) obesity with alveolar hypoventilation: Secondary | ICD-10-CM | POA: Diagnosis present

## 2011-08-24 LAB — COMPREHENSIVE METABOLIC PANEL
BUN: 33 mg/dL — ABNORMAL HIGH (ref 6–23)
CO2: 28 mEq/L (ref 19–32)
Calcium: 8.9 mg/dL (ref 8.4–10.5)
Chloride: 99 mEq/L (ref 96–112)
Creatinine, Ser: 2.11 mg/dL — ABNORMAL HIGH (ref 0.50–1.10)
GFR calc Af Amer: 31 mL/min — ABNORMAL LOW (ref >90)
GFR calc non Af Amer: 27 mL/min — ABNORMAL LOW (ref >90)
Glucose, Bld: 99 mg/dL (ref 70–99)
Total Bilirubin: 0.4 mg/dL (ref 0.3–1.2)

## 2011-08-24 LAB — CBC
HCT: 38.7 % (ref 36.0–46.0)
Hemoglobin: 11.8 g/dL — ABNORMAL LOW (ref 12.0–15.0)
MCV: 92.4 fL (ref 78.0–100.0)
WBC: 11.4 10*3/uL — ABNORMAL HIGH (ref 4.0–10.5)

## 2011-08-24 LAB — DIFFERENTIAL
Eosinophils Relative: 3 % (ref 0–5)
Lymphocytes Relative: 17 % (ref 12–46)
Monocytes Absolute: 0.7 10*3/uL (ref 0.1–1.0)
Monocytes Relative: 6 % (ref 3–12)
Neutro Abs: 8.4 10*3/uL — ABNORMAL HIGH (ref 1.7–7.7)

## 2011-08-24 LAB — SAMPLE TO BLOOD BANK

## 2011-08-24 LAB — D-DIMER, QUANTITATIVE: D-Dimer, Quant: 0.9 ug/mL-FEU — ABNORMAL HIGH (ref 0.00–0.48)

## 2011-08-24 MED ORDER — SODIUM CHLORIDE 0.9 % IV BOLUS (SEPSIS)
1000.0000 mL | Freq: Once | INTRAVENOUS | Status: AC
  Administered 2011-08-24: 1000 mL via INTRAVENOUS

## 2011-08-24 MED ORDER — SODIUM CHLORIDE 0.9 % IV SOLN
Freq: Once | INTRAVENOUS | Status: AC
  Administered 2011-08-25: 125 mL/h via INTRAVENOUS

## 2011-08-24 NOTE — ED Notes (Signed)
MD at bedside. 

## 2011-08-24 NOTE — H&P (Addendum)
PCP:   Henderson Baltimore, MD, MD   Chief complaint leg pain  History of present illness  46 year old female with medical problems who is frequently seen in emergency department with folliculitis and abscess he was on antibiotics currently for folliculitis which was diagnosed 2 days ago in emergency department she was started on clindamycin. She's had several episodes of vomiting that has resolved. She comes in because of bilateral lower extremity swelling and pain. She denies any fevers. She is on diuretics and an ACE inhibitor for high blood pressure. She is basically wheelchair-bound because of her obesity. She denies any abdominal pain chest pain or diarrhea. She does have chronic shortness of breath which is no worse than normal. She is on chronic narcotics for chronic back pain and chronic headaches. We are being asked to admit the patient do to worsening renal failure and borderline hypotension in the emergency department that has been resolving with IV fluids.   Review of Systems: Otherwise negative  Past Medical History: Past Medical History  Diagnosis Date  . Asthma   . GERD (gastroesophageal reflux disease)   . Hypertension   . Homelessness   . Low back pain   . 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 Tubal ligation   . Cesarean section     Medications: Prior to Admission medications   Medication Sig Start Date End Date Taking? Authorizing Provider  albuterol (PROVENTIL HFA) 108 (90 BASE) MCG/ACT inhaler Inhale 1 puff into the lungs every 4 (four) hours as needed. For asthma relief   Yes Historical Provider, MD  albuterol (PROVENTIL) (2.5 MG/3ML) 0.083% nebulizer solution Take 3 mLs (2.5 mg total) by nebulization every 6 (six) hours as needed for wheezing. 05/27/11 05/26/12 Yes Tatyana A Kirichenko, PA  allopurinol (ZYLOPRIM) 100 MG tablet Take 1 tablet (100 mg total) by mouth daily. 07/01/10 08/30/11 Yes Marijean Bravo, MD    ALPRAZolam Duanne Moron) 1 MG tablet Take 1 mg by mouth 2 (two) times daily. Anxiety and depression   Yes Historical Provider, MD  ammonium lactate (AMLACTIN) 12 % cream Apply topically 2 (two) times daily. In a thin layer on affected area    Yes Historical Provider, MD  aspirin 325 MG tablet Take 325 mg by mouth daily.   Yes Historical Provider, MD  atorvastatin (LIPITOR) 20 MG tablet Take 20 mg by mouth daily.   Yes Historical Provider, MD  cephALEXin (KEFLEX) 500 MG capsule Take 500 mg by mouth 2 (two) times daily.   Yes Historical Provider, MD  clindamycin (CLEOCIN) 300 MG capsule Take 1 capsule (300 mg total) by mouth 4 (four) times daily. X 7 days 08/19/11 08/29/11 Yes Martie Lee, PA  cloNIDine (CATAPRES) 0.2 MG tablet Take 1 tablet (0.2 mg total) by mouth 2 (two) times daily. 07/22/10  Yes Marijean Bravo, MD  diphenhydrAMINE (BENADRYL) 25 mg capsule Take 25 mg by mouth every 6 (six) hours as needed.   Yes Historical Provider, MD  Emollient (EUCERIN) lotion Apply topically 2 (two) times daily. On affected areas    Yes Historical Provider, MD  famotidine (PEPCID) 20 MG tablet Take 1 tablet (20 mg total) by mouth 2 (two) times daily. 06/15/11 06/14/12 Yes Garald Balding, NP  furosemide (LASIX) 40 MG tablet Take 40 mg by mouth daily.   Yes Historical Provider, MD  HYDROcodone-acetaminophen (NORCO) 10-325 MG per tablet Take 1 tablet by mouth every 6 (six) hours  as needed. For pain   Yes Historical Provider, MD  ketoconazole (NIZORAL) 200 MG tablet Take 200 mg by mouth daily.   Yes Historical Provider, MD  lisinopril (PRINIVIL,ZESTRIL) 20 MG tablet Take 2 tablets (40 mg total) by mouth daily. 07/01/10 08/31/11 Yes Marijean Bravo, MD  lubiprostone (AMITIZA) 24 MCG capsule Take 24 mcg by mouth 2 (two) times daily with a meal.   Yes Historical Provider, MD  mupirocin cream (BACTROBAN) 2 % Apply topically 3 (three) times daily. 08/19/11 08/26/11 Yes Ruthell Rummage Dammen, PA  naproxen (NAPROSYN) 500 MG tablet Take 500 mg by  mouth 2 (two) times daily with a meal. Pain and gout pain   Yes Historical Provider, MD  oxyCODONE-acetaminophen (PERCOCET) 5-325 MG per tablet Take 1-2 tablets by mouth every 6 (six) hours as needed for pain. 08/19/11 08/29/11 Yes Peter S Dammen, PA  pantoprazole (PROTONIX) 40 MG tablet Take 40 mg by mouth daily.   Yes Historical Provider, MD  solifenacin (VESICARE) 5 MG tablet Take 1 tablet (5 mg total) by mouth daily. 07/22/10 08/31/11 Yes Marijean Bravo, MD  traZODone (DESYREL) 50 MG tablet Take 2 tablets (100 mg total) by mouth at bedtime. 08/02/10  Yes Trish Fountain, MD  diphenhydrAMINE (BENADRYL) 25 MG tablet Take 2 tablets (50 mg total) by mouth every 4 (four) hours as needed for itching. 06/06/11 07/06/11  Martie Lee, PA    Allergies:   Allergies  Allergen Reactions  . Azithromycin     REACTION: Rash and hives  . Doxycycline     rash  . Ibuprofen     REACTION: Rash and dizziness  . Sulfamethoxazole W-Trimethoprim Rash    Social History:  reports that she quit smoking about 8 years ago. Her smoking use included Cigarettes. She quit smokeless tobacco use about 33 years ago. She reports that she does not drink alcohol or use illicit drugs.  Family History: Family History  Problem Relation Age of Onset  . Asthma Mother   . Diabetes Father   . Heart disease Father   . Stroke Father     Physical Exam: Filed Vitals:   08/24/11 2240 08/24/11 2245 08/24/11 2247 08/24/11 2250  BP: 103/53 100/89 139/96 117/70  Pulse: 71 74 82 70  Temp:      TempSrc:      Resp:  18    SpO2:  100%     General appearance: alert, cooperative and no distress nontoxic-appearing Lungs: clear to auscultation bilaterally Heart: regular rate and rhythm, S1, S2 normal, no murmur, click, rub or gallop Abdomen: soft, non-tender; bowel sounds normal; no masses,  no organomegaly Extremities: extremities normal, atraumatic, no cyanosis or edema she does have some mild bilateral lotion any  swelling Pulses: 2+ and symmetric Skin: She has folliculitis that is worse to the right shin area with no areas of purulent discharge consistent with a folliculitis Neurologic: Grossly normal    Labs on Admission:   [facility] 08/24/11 2200  NA 136  K 4.9  CL 99  CO2 28  GLUCOSE 99  BUN 33*  CREATININE 2.11*  CALCIUM 8.9  MG --  PHOS --    Basename 08/24/11 2200  AST 11  ALT 11  ALKPHOS 66  BILITOT 0.4  PROT 7.0  ALBUMIN 3.6    Basename 08/24/11 2200  WBC 11.4*  NEUTROABS 8.4*  HGB 11.8*  HCT 38.7  MCV 92.4  PLT 272    Radiological Exams on Admission: No results found.  Assessment/Plan Present  on Admission:  46 year old female with acute renal failure, hypotension in the setting of recently diagnosed folliculitis on antibiotics with some mild vomiting on diuretic and ACE inhibitor  .Vomiting .Acute renal failure .HYPERTENSION .BOILS, RECURRENT .Chronic pain .Morbid obesity  Suspect a renal failure is likely due to combination of antibiotic use along with diuretic and ACE inhibitor. Her blood pressure is borderline which is improved with IV fluids. Urinalysis is pending. He is no source of infection besides a folliculitis. Will ultrasound her legs to make sure she does not have underlying blood clot. We'll also check a lipase level and a lactic acid level. We'll also check one set of cardiac enzymes. Her 12-lead EKG is negative. She has not appear toxic. Continue CPAP each bedtime for her sleep apnea. Minimize narcotics due to her multiple comorbidities affecting her respiratory status.  Dahlgren A [id number] 08/24/2011, 11:20 PM Patient's seen before midnight    pts hypotension recurred and worsened in ED, switched bed to stepdown.  Labs w/u pending.  Will broaden abx to vanco until clinical picture is clearer ??sepsis vs dehydration with mult bp meds

## 2011-08-24 NOTE — ED Provider Notes (Addendum)
History     CSN: [id number]  Arrival date & time 08/24/11  2008   First MD Initiated Contact with Patient 08/24/11 2050      Chief Complaint  Patient presents with  . Emesis  . Leg Swelling    (Consider location/radiation/quality/duration/timing/severity/associated sxs/prior treatment) Patient is a 46 y.o. female presenting with vomiting. The history is provided by the patient.  Emesis    patient here with leg pain and swelling worse on the left side. Seen here 5 days ago for similar symptoms diagnosed with folliculitis and discharged home. Was told to return here should these get worse. Notes increasing pain with ambulation. She however is wheelchair-bound for most of the day and does have a history of chronic pain. Some dyspnea without pleuritic chest pain. Does note whole-body weakness. She is concerned that she has cellulitis  Past Medical History  Diagnosis Date  . Asthma   . GERD (gastroesophageal reflux disease)   . Hypertension   . Homelessness   . Low back pain   . 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 Tubal ligation   . Cesarean section     Family History  Problem Relation Age of Onset  . Asthma Mother   . Diabetes Father   . Heart disease Father   . Stroke Father     History  Substance Use Topics  . Smoking status: Former Smoker    Types: Cigarettes    Quit date: 03/25/2003  . Smokeless tobacco: Former Systems developer    Quit date: 05/27/1978  . Alcohol Use: No    OB History    Grav Para Term Preterm Abortions TAB SAB Ect Mult Living                  Review of Systems  Gastrointestinal: Positive for vomiting.  All other systems reviewed and are negative.    Allergies  Azithromycin; Doxycycline; Ibuprofen; and Sulfamethoxazole w-trimethoprim  Home Medications   Current Outpatient Rx  Name Route Sig Dispense Refill  . ALBUTEROL SULFATE HFA 108 (90 BASE) MCG/ACT IN AERS Inhalation Inhale 1  puff into the lungs every 4 (four) hours as needed. For asthma relief    . ALBUTEROL SULFATE (2.5 MG/3ML) 0.083% IN NEBU Nebulization Take 3 mLs (2.5 mg total) by nebulization every 6 (six) hours as needed for wheezing. 75 mL 12  . ALLOPURINOL 100 MG PO TABS Oral Take 1 tablet (100 mg total) by mouth daily. 30 tablet 2  . ALPRAZOLAM 1 MG PO TABS Oral Take 1 mg by mouth 2 (two) times daily. Anxiety and depression    . AMMONIUM LACTATE 12 % EX CREA Topical Apply topically 2 (two) times daily. In a thin layer on affected area     . ASPIRIN 325 MG PO TABS Oral Take 325 mg by mouth daily.    . ATORVASTATIN CALCIUM 20 MG PO TABS Oral Take 20 mg by mouth daily.    . CEPHALEXIN 500 MG PO CAPS Oral Take 500 mg by mouth 2 (two) times daily.    [name] CLINDAMYCIN HCL 300 MG PO CAPS Oral Take 1 capsule (300 mg total) by mouth 4 (four) times daily. X 7 days 28 capsule 0  . CLONIDINE HCL 0.2 MG PO TABS Oral Take 1 tablet (0.2 mg total) by mouth 2 (two) times daily. 60 tablet 11  . DIPHENHYDRAMINE HCL 25 MG PO CAPS Oral Take 25 mg  by mouth every 6 (six) hours as needed.    Ree Kida EX LOTN Topical Apply topically 2 (two) times daily. On affected areas     . FAMOTIDINE 20 MG PO TABS Oral Take 1 tablet (20 mg total) by mouth 2 (two) times daily. 30 tablet 0  . FUROSEMIDE 40 MG PO TABS Oral Take 40 mg by mouth daily.    [name] HYDROCODONE-ACETAMINOPHEN 10-325 MG PO TABS Oral Take 1 tablet by mouth every 6 (six) hours as needed. For pain    . KETOCONAZOLE 200 MG PO TABS Oral Take 200 mg by mouth daily.    [name] LISINOPRIL 20 MG PO TABS Oral Take 2 tablets (40 mg total) by mouth daily. 30 tablet 5  . LUBIPROSTONE 24 MCG PO CAPS Oral Take 24 mcg by mouth 2 (two) times daily with a meal.    . MUPIROCIN CALCIUM 2 % EX CREA Topical Apply topically 3 (three) times daily. 15 g 0  . NAPROXEN 500 MG PO TABS Oral Take 500 mg by mouth 2 (two) times daily with a meal. Pain and gout pain    . OXYCODONE-ACETAMINOPHEN 5-325 MG PO TABS Oral  Take 1-2 tablets by mouth every 6 (six) hours as needed for pain. 20 tablet 0  . PANTOPRAZOLE SODIUM 40 MG PO TBEC Oral Take 40 mg by mouth daily.    [name] SOLIFENACIN SUCCINATE 5 MG PO TABS Oral Take 1 tablet (5 mg total) by mouth daily. 30 tablet 11  . TRAZODONE HCL 50 MG PO TABS Oral Take 2 tablets (100 mg total) by mouth at bedtime. 60 tablet 3  . DIPHENHYDRAMINE HCL 25 MG PO TABS Oral Take 2 tablets (50 mg total) by mouth every 4 (four) hours as needed for itching. 20 tablet 0    BP 88/52  Pulse 77  Temp(Src) 98.7 F (37.1 C) (Oral)  Resp 18  SpO2 94%  LMP 03/24/2009  Physical Exam  Nursing note and vitals reviewed. Constitutional: She is oriented to person, place, and time. She appears well-developed and well-nourished.  Non-toxic appearance. No distress.  HENT:  Head: Normocephalic and atraumatic.  Eyes: Conjunctivae, EOM and lids are normal. Pupils are equal, round, and reactive to light.  Neck: Normal range of motion. Neck supple. No tracheal deviation present. No mass present.  Cardiovascular: Normal rate, regular rhythm and normal heart sounds.  Exam reveals no gallop.   No murmur heard. Pulmonary/Chest: Effort normal and breath sounds normal. No stridor. No respiratory distress. She has no decreased breath sounds. She has no wheezes. She has no rhonchi. She has no rales.  Abdominal: Soft. Normal appearance and bowel sounds are normal. She exhibits no distension. There is no tenderness. There is no rebound and no CVA tenderness.  Musculoskeletal: Normal range of motion. She exhibits no edema and no tenderness.       Bilateral lower extremity folliculitis with out evidence of cellulitis.  Neurological: She is alert and oriented to person, place, and time. She has normal strength. No cranial nerve deficit or sensory deficit. GCS eye subscore is 4. GCS verbal subscore is 5. GCS motor subscore is 6.  Skin: Skin is warm and dry. No abrasion and no rash noted.  Psychiatric: She has a  normal mood and affect. Her speech is normal and behavior is normal.    ED Course  Procedures (including critical care time)   Labs Reviewed  CBC  DIFFERENTIAL  COMPREHENSIVE METABOLIC PANEL  SAMPLE TO BLOOD BANK  D-DIMER, QUANTITATIVE  No results found.   No diagnosis found.    MDM  Patient given IV fluids her blood pressure has improved. Patient has renal insufficiency likely from her use of Lasix and lisinopril. She will be admitted for observation    Date: 10/25/2011  Rate: 62  Rhythm: normal sinus rhythm  QRS Axis: normal  Intervals: normal  ST/T Wave abnormalities: normal  Conduction Disutrbances:none  Narrative Interpretation:   Old EKG Reviewed: none available       Leota Jacobsen, MD 08/24/11 [id number]  Leota Jacobsen, MD 10/25/11 [phone]

## 2011-08-24 NOTE — ED Notes (Signed)
Current and previous EKG printed and given to [place] for review.

## 2011-08-24 NOTE — ED Notes (Signed)
Pt alert, nad, arrives from home, c/o nausea, leg swelling, resp even unlabored, skin pwd, c/o "cellulitus", foul smell noted in triage

## 2011-08-25 DIAGNOSIS — M79609 Pain in unspecified limb: Secondary | ICD-10-CM

## 2011-08-25 DIAGNOSIS — I959 Hypotension, unspecified: Secondary | ICD-10-CM

## 2011-08-25 DIAGNOSIS — L739 Follicular disorder, unspecified: Secondary | ICD-10-CM | POA: Diagnosis present

## 2011-08-25 DIAGNOSIS — R112 Nausea with vomiting, unspecified: Secondary | ICD-10-CM

## 2011-08-25 DIAGNOSIS — M7989 Other specified soft tissue disorders: Secondary | ICD-10-CM

## 2011-08-25 DIAGNOSIS — N179 Acute kidney failure, unspecified: Secondary | ICD-10-CM

## 2011-08-25 DIAGNOSIS — L738 Other specified follicular disorders: Secondary | ICD-10-CM

## 2011-08-25 LAB — BASIC METABOLIC PANEL WITH GFR
BUN: 29 mg/dL — ABNORMAL HIGH (ref 6–23)
CO2: 24 meq/L (ref 19–32)
Calcium: 8 mg/dL — ABNORMAL LOW (ref 8.4–10.5)
Chloride: 104 meq/L (ref 96–112)
Creatinine, Ser: 1.66 mg/dL — ABNORMAL HIGH (ref 0.50–1.10)
GFR calc Af Amer: 41 mL/min — ABNORMAL LOW
GFR calc non Af Amer: 36 mL/min — ABNORMAL LOW
Glucose, Bld: 99 mg/dL (ref 70–99)
Potassium: 4 meq/L (ref 3.5–5.1)
Sodium: 138 meq/L (ref 135–145)

## 2011-08-25 LAB — CBC
MCH: 28.3 pg (ref 26.0–34.0)
MCHC: 30.4 g/dL (ref 30.0–36.0)
MCV: 93.1 fL (ref 78.0–100.0)
Platelets: 233 10*3/uL (ref 150–400)
RDW: 15 % (ref 11.5–15.5)
WBC: 8.9 10*3/uL (ref 4.0–10.5)

## 2011-08-25 LAB — LACTIC ACID, PLASMA: Lactic Acid, Venous: 1 mmol/L (ref 0.5–2.2)

## 2011-08-25 LAB — CARDIAC PANEL(CRET KIN+CKTOT+MB+TROPI): CK, MB: 1.6 ng/mL (ref 0.3–4.0)

## 2011-08-25 MED ORDER — SODIUM CHLORIDE 0.9 % IV SOLN
INTRAVENOUS | Status: AC
  Administered 2011-08-25: 1000 mL via INTRAVENOUS

## 2011-08-25 MED ORDER — OXYCODONE-ACETAMINOPHEN 5-325 MG PO TABS
1.0000 | ORAL_TABLET | Freq: Three times a day (TID) | ORAL | Status: DC | PRN
  Administered 2011-08-25: 1 via ORAL
  Administered 2011-08-26 – 2011-08-27 (×3): 2 via ORAL
  Filled 2011-08-25 (×2): qty 2
  Filled 2011-08-25: qty 1
  Filled 2011-08-25: qty 2

## 2011-08-25 MED ORDER — VANCOMYCIN HCL 1000 MG IV SOLR: 1500.0000 mg | Freq: Two times a day (BID) | INTRAVENOUS | Status: DC

## 2011-08-25 MED ORDER — VANCOMYCIN HCL 1000 MG IV SOLR
2000.0000 mg | Freq: Once | INTRAVENOUS | Status: AC
  Administered 2011-08-25: 2000 mg via INTRAVENOUS
  Filled 2011-08-25: qty 2000

## 2011-08-25 MED ORDER — ALLOPURINOL 100 MG PO TABS
100.0000 mg | ORAL_TABLET | Freq: Every day | ORAL | Status: DC
  Administered 2011-08-25 – 2011-08-27 (×3): 100 mg via ORAL
  Filled 2011-08-25 (×3): qty 1

## 2011-08-25 MED ORDER — MUPIROCIN 2 % EX OINT
1.0000 "application " | TOPICAL_OINTMENT | Freq: Two times a day (BID) | CUTANEOUS | Status: DC
  Administered 2011-08-25 – 2011-08-27 (×5): 1 via NASAL
  Filled 2011-08-25 (×2): qty 22

## 2011-08-25 MED ORDER — ASPIRIN 325 MG PO TABS
325.0000 mg | ORAL_TABLET | Freq: Every day | ORAL | Status: DC
  Administered 2011-08-25 – 2011-08-27 (×3): 325 mg via ORAL
  Filled 2011-08-25 (×3): qty 1

## 2011-08-25 MED ORDER — CEPHALEXIN 500 MG PO CAPS
500.0000 mg | ORAL_CAPSULE | Freq: Two times a day (BID) | ORAL | Status: DC
  Administered 2011-08-25: 500 mg via ORAL
  Filled 2011-08-25 (×3): qty 1

## 2011-08-25 MED ORDER — KETOCONAZOLE 200 MG PO TABS
200.0000 mg | ORAL_TABLET | Freq: Every day | ORAL | Status: DC
  Administered 2011-08-25 – 2011-08-27 (×3): 200 mg via ORAL
  Filled 2011-08-25 (×3): qty 1

## 2011-08-25 MED ORDER — LUBIPROSTONE 24 MCG PO CAPS
24.0000 ug | ORAL_CAPSULE | Freq: Two times a day (BID) | ORAL | Status: DC
  Administered 2011-08-25 – 2011-08-27 (×5): 24 ug via ORAL
  Filled 2011-08-25 (×8): qty 1

## 2011-08-25 MED ORDER — SODIUM CHLORIDE 0.9 % IJ SOLN
3.0000 mL | Freq: Two times a day (BID) | INTRAMUSCULAR | Status: DC
  Administered 2011-08-25 – 2011-08-26 (×3): 3 mL via INTRAVENOUS

## 2011-08-25 MED ORDER — TRAZODONE HCL 100 MG PO TABS
100.0000 mg | ORAL_TABLET | Freq: Every day | ORAL | Status: DC
  Administered 2011-08-25: 100 mg via ORAL
  Filled 2011-08-25 (×3): qty 1

## 2011-08-25 MED ORDER — ONDANSETRON HCL 4 MG PO TABS: 4.0000 mg | ORAL_TABLET | Freq: Four times a day (QID) | ORAL | Status: DC | PRN

## 2011-08-25 MED ORDER — NAPROXEN 500 MG PO TABS
500.0000 mg | ORAL_TABLET | Freq: Two times a day (BID) | ORAL | Status: DC
Start: 2011-08-25 — End: 2011-08-25
  Administered 2011-08-25: 500 mg via ORAL
  Filled 2011-08-25 (×3): qty 1

## 2011-08-25 MED ORDER — SODIUM CHLORIDE 0.9 % IV SOLN
1000.0000 mL | INTRAVENOUS | Status: DC
  Administered 2011-08-25 – 2011-08-27 (×4): 1000 mL via INTRAVENOUS

## 2011-08-25 MED ORDER — ALBUTEROL SULFATE (5 MG/ML) 0.5% IN NEBU
2.5000 mg | INHALATION_SOLUTION | Freq: Four times a day (QID) | RESPIRATORY_TRACT | Status: DC | PRN
  Administered 2011-08-25: 2.5 mg via RESPIRATORY_TRACT
  Filled 2011-08-25: qty 0.5

## 2011-08-25 MED ORDER — PANTOPRAZOLE SODIUM 40 MG PO TBEC
40.0000 mg | DELAYED_RELEASE_TABLET | Freq: Every day | ORAL | Status: DC
  Administered 2011-08-25 – 2011-08-27 (×3): 40 mg via ORAL
  Filled 2011-08-25 (×3): qty 1

## 2011-08-25 MED ORDER — CLINDAMYCIN HCL 300 MG PO CAPS
300.0000 mg | ORAL_CAPSULE | Freq: Four times a day (QID) | ORAL | Status: DC
  Administered 2011-08-25 – 2011-08-27 (×9): 300 mg via ORAL
  Filled 2011-08-25 (×13): qty 1

## 2011-08-25 MED ORDER — SODIUM CHLORIDE 0.9 % IV SOLN: 1000.0000 mL | Freq: Once | INTRAVENOUS | Status: DC

## 2011-08-25 MED ORDER — ONDANSETRON HCL 4 MG/2ML IJ SOLN: 4.0000 mg | Freq: Four times a day (QID) | INTRAMUSCULAR | Status: DC | PRN

## 2011-08-25 MED ORDER — ENOXAPARIN SODIUM 30 MG/0.3ML ~~LOC~~ SOLN
30.0000 mg | SUBCUTANEOUS | Status: DC
  Administered 2011-08-25: 30 mg via SUBCUTANEOUS
  Filled 2011-08-25 (×3): qty 0.3

## 2011-08-25 MED ORDER — ALBUTEROL SULFATE (5 MG/ML) 0.5% IN NEBU: 2.5000 mg | INHALATION_SOLUTION | RESPIRATORY_TRACT | Status: DC | PRN

## 2011-08-25 MED ORDER — VITAMINS A & D EX OINT
TOPICAL_OINTMENT | CUTANEOUS | Status: AC
  Administered 2011-08-25: 5
  Filled 2011-08-25: qty 5

## 2011-08-25 MED ORDER — OXYCODONE-ACETAMINOPHEN 5-325 MG PO TABS
1.0000 | ORAL_TABLET | Freq: Three times a day (TID) | ORAL | Status: DC | PRN
  Administered 2011-08-25 (×2): 1 via ORAL
  Filled 2011-08-25 (×2): qty 1

## 2011-08-25 MED ORDER — CHLORHEXIDINE GLUCONATE CLOTH 2 % EX PADS
6.0000 | MEDICATED_PAD | Freq: Every day | CUTANEOUS | Status: DC
  Administered 2011-08-25 – 2011-08-27 (×3): 6 via TOPICAL

## 2011-08-25 MED ORDER — ALPRAZOLAM 1 MG PO TABS
1.0000 mg | ORAL_TABLET | Freq: Two times a day (BID) | ORAL | Status: DC
  Administered 2011-08-25 – 2011-08-26 (×5): 1 mg via ORAL
  Filled 2011-08-25 (×5): qty 1

## 2011-08-25 MED ORDER — ALBUTEROL SULFATE (5 MG/ML) 0.5% IN NEBU
2.5000 mg | INHALATION_SOLUTION | Freq: Four times a day (QID) | RESPIRATORY_TRACT | Status: DC
  Administered 2011-08-25 (×2): 2.5 mg via RESPIRATORY_TRACT
  Filled 2011-08-25 (×2): qty 0.5

## 2011-08-25 NOTE — Progress Notes (Signed)
Utilization review completed.  

## 2011-08-25 NOTE — Progress Notes (Signed)
Received patient from stepdown, very sleepy but easily arousable and verbally responsive. No complaints of pain or discomfort at this time.

## 2011-08-25 NOTE — Progress Notes (Signed)
VASCULAR LAB PRELIMINARY  PRELIMINARY  PRELIMINARY  PRELIMINARY  Bilateral lower extremity venous duplex  completed.    Preliminary report:  Bilateral:  No obvious evidence of DVT, superficial thrombosis, or Baker's Cyst.  Technically limited by body habitus.    Judithann Sauger, RVT 08/25/2011, 9:24 AM

## 2011-08-25 NOTE — ED Notes (Signed)
RN not available at this time to take report.

## 2011-08-25 NOTE — ED Notes (Signed)
RN unavailable for report at this time.

## 2011-08-25 NOTE — ED Notes (Signed)
Report called to ICU step down RN

## 2011-08-25 NOTE — Progress Notes (Signed)
ANTIBIOTIC CONSULT NOTE - INITIAL  Pharmacy Consult for heparin Indication: rule out sepsis  Allergies  Allergen Reactions  . Azithromycin     REACTION: Rash and hives  . Doxycycline     rash  . Ibuprofen     REACTION: Rash and dizziness  . Sulfamethoxazole W-Trimethoprim Rash    Patient Measurements: Height: 5\' 5"  (165.1 cm) Weight: 423 lb 15.1 oz (192.3 kg) IBW/kg (Calculated) : 57  Adjusted Body Weight:  Vital Signs: Temp: 98.1 F (36.7 C) (06/03 0315) Temp src: Oral (06/03 0315) BP: 96/57 mmHg (06/03 0500) Pulse Rate: 82  (06/03 0500) Intake/Output from previous day: 06/02 0701 - 06/03 0700 In: 715.4 [I.V.:215.4; IV Piggyback:500] Out: -  Intake/Output from this shift: Total I/O In: 715.4 [I.V.:215.4; IV Piggyback:500] Out: -   Labs:  Basename 08/24/11 2200  WBC 11.4*  HGB 11.8*  PLT 272  LABCREA --  CREATININE 2.11*   Estimated Creatinine Clearance: 57.8 ml/min (by C-G formula based on Cr of 2.11). No results found for this basename: VANCOTROUGH:2,VANCOPEAK:2,VANCORANDOM:2,GENTTROUGH:2,GENTPEAK:2,GENTRANDOM:2,TOBRATROUGH:2,TOBRAPEAK:2,TOBRARND:2,AMIKACINPEAK:2,AMIKACINTROU:2,AMIKACIN:2, in the last 72 hours   Microbiology: Recent Results (from the past 720 hour(s))  MRSA PCR SCREENING     Status: Abnormal   Collection Time   08/25/11  3:37 AM      Component Value Range Status Comment   MRSA by PCR POSITIVE (*) NEGATIVE  Final     Medical History: Past Medical History  Diagnosis Date  . Asthma   . GERD (gastroesophageal reflux disease)   . Hypertension   . Homelessness   . Low back pain   . 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    Medications:  Anti-infectives     Start     Dose/Rate Route Frequency Ordered Stop   08/25/11 1800   vancomycin (VANCOCIN) 1,500 mg in sodium chloride 0.9 % 500 mL IVPB        1,500 mg 250 mL/hr over 120 Minutes Intravenous Every 12 hours 08/25/11 0538     08/25/11 1000   clindamycin (CLEOCIN) capsule  300 mg        300 mg Oral 4 times daily 08/25/11 0314     08/25/11 1000   ketoconazole (NIZORAL) tablet 200 mg        200 mg Oral Daily 08/25/11 0314     08/25/11 0330   vancomycin (VANCOCIN) 2,000 mg in sodium chloride 0.9 % 500 mL IVPB        2,000 mg 250 mL/hr over 120 Minutes Intravenous  Once 08/25/11 0330     08/25/11 0315   cephALEXin (KEFLEX) capsule 500 mg        500 mg Oral 2 times daily 08/25/11 0314           Assessment: Patient with folliculitis and abscess and now r/o sepsis.  Vancomycin 2gm iv x1 already given.  Goal of Therapy:  Vancomycin trough level 15-20 mcg/ml  Plan:  Measure antibiotic drug levels at steady state Follow up culture results Vancomycin 1500mg  iv q12hr   Nani Skillern Crowford 08/25/2011,5:45 AM

## 2011-08-25 NOTE — Progress Notes (Signed)
Patient denies use of nocturnal PPV every night at home. Admits to noncompliance. She refuses at this time, but agrees that she may use it later. She is aware that RT is available to assist her any time she is agreeable. Equipment will be placed at bedside post transfer to her assigned bed from the ED.

## 2011-08-25 NOTE — Progress Notes (Signed)
[facility] PROGRESS NOTE  Kara Vincent Monday [id number] DOB: 1966-01-29 DOA: 08/24/2011 PCP: Henderson Baltimore, MD, MD  Assessment/Plan: 1. Nausea, vomiting: Resolved. Etiology unclear--possibly viral. Preceded administration of antibiotics. 2. Acute renal failure: Improving, continue IV fluids. Multifactorial: Vomiting, diuretics, ACE inhibitor. Hold ACE inhibitor and diuretics. 3. Hypotension: Improving. Lactic acid normal. No evidence to suggest sepsis. Probably secondary to dehydration, diuretics. Continue IV fluids. Blood cultures, urinalysis not obtained and was there is no evidence of infection we will hold off. 4. Folliculitis: No evidence of cellulitis. Continue clindamycin. Doubt clindamycin resistant MRSA--discontinue vancomycin. 5. Elevated d-dimer: No chest pain. Shortness of breath at baseline. Noted to be elevated 01/15/2011--CT angiogram at that time was negative for pulmonary embolism. Followup lower extremity Dopplers. Doubt DVT. No history/evidence to suggest pulmonary embolism. 6. OSA: Noncompliant with CPAP at home. CPAP each night.  Appears stable for transfer to the floor. No evidence of superinfection.   Code Status: Full code Family Communication: None at bedside Disposition Plan: Home when improved  Murray Hodgkins, MD  [facility] Pager [phone]. If 8PM-8AM, please contact night-coverage at [email or web address], password [facility] 08/25/2011, 8:34 AM  LOS: 1 day   Brief narrative: 46 year old female with medical problems who is frequently seen in emergency department with folliculitis and abscess he was on antibiotics currently for folliculitis which was diagnosed 2 days ago in emergency department she was started on clindamycin. She's had several episodes of vomiting that has resolved. She comes in because of bilateral lower extremity swelling and pain. She denies any fevers. She is on diuretics and an ACE inhibitor for high blood pressure. She is  basically wheelchair-bound because of her obesity. She denies any abdominal pain chest pain or diarrhea. She does have chronic shortness of breath which is no worse than normal. She is on chronic narcotics for chronic back pain and chronic headaches. We are being asked to admit the patient do to worsening renal failure and borderline hypotension in the emergency department that has been resolving with IV fluids.  Consultants:  None  Procedures:  Bilateral lower extremity venous Doppler  Antibiotics:  Keflex 6/3?6/3  Clindamycin 6/3?  Vancomycin 6/3?  HPI/Subjective: Complains of swelling in the legs.   Objective: Filed Vitals:   08/25/11 0600 08/25/11 0615 08/25/11 0630 08/25/11 0645  BP: 92/42  91/50   Pulse: 75 77 85 74  Temp:      TempSrc:      Resp: 16 16 16 16   Height:      Weight:      SpO2: 95% 94% 93% 95%    Intake/Output Summary (Last 24 hours) at 08/25/11 0834 Last data filed at 08/25/11 0600  Gross per 24 hour  Intake 790.42 ml  Output      0 ml  Net 790.42 ml    Exam:   General:  Appears calm and comfortable.  Cardiovascular: Distant. Regular rate and rhythm. No murmur, rub, gallop. 1+ bilateral lower extremity edema.  Respiratory: Clear to auscultation bilaterally. No wheezes, rales or rhonchi. Normal respiratory effort.  Abdomen: soft, non-tender, non-distended.  Skin: folliculitis noted over bilateral lower extremities. No cellulitis or abscess noted. Groin and labia appear unremarkable. Examined with chaperone NT Muniz.  Data Reviewed: Basic Metabolic Panel:  Lab [id number] 0525 08/24/11 2200 08/19/11 0231  NA 138 136 142  K 4.0 4.9 4.0  CL 104 99 101  CO2 24 28 29   GLUCOSE 99 99 95  BUN 29* 33* 18  CREATININE 1.66* 2.11* 1.01  CALCIUM  8.0* 8.9 8.4  MG -- -- --  PHOS -- -- --   Liver Function Tests:  Lab 08/24/11 2200  AST 11  ALT 11  ALKPHOS 66  BILITOT 0.4  PROT 7.0  ALBUMIN 3.6    Lab 08/25/11 0208  LIPASE 24    AMYLASE --   CBC:  Lab 08/25/11 0525 08/24/11 2200 08/19/11 0231  WBC 8.9 11.4* 10.5  NEUTROABS -- 8.4* 6.9  HGB 11.0* 11.8* 13.0  HCT 36.2 38.7 42.6  MCV 93.1 92.4 91.8  PLT 233 272 357   Cardiac Enzymes:  Lab 08/25/11 0208  CKTOTAL 65  CKMB 1.6  CKMBINDEX --  TROPONINI <0.30   Recent Results (from the past 240 hour(s))  MRSA PCR SCREENING     Status: Abnormal   Collection Time   08/25/11  3:37 AM      Component Value Range Status Comment   MRSA by PCR POSITIVE (*) NEGATIVE  Final     Studies: Dg Chest 2 View  08/24/2011  *RADIOLOGY REPORT*  IMPRESSION: Stable cardiac enlargement. Low lung volumes with vascular crowding and atelectasis.  Original Report Authenticated By: P. Kalman Jewels, M.D.   Scheduled Meds:   . sodium chloride   Intravenous Once  . sodium chloride  1,000 mL Intravenous Once  . albuterol  2.5 mg Nebulization Q6H WA  . allopurinol  100 mg Oral Daily  . ALPRAZolam  1 mg Oral BID  . aspirin  325 mg Oral Daily  . cephALEXin  500 mg Oral BID  . Chlorhexidine Gluconate Cloth  6 each Topical Q0600  . clindamycin  300 mg Oral QID  . enoxaparin  30 mg Subcutaneous Q24H  . ketoconazole  200 mg Oral Daily  . lubiprostone  24 mcg Oral BID WC  . mupirocin ointment  1 application Nasal BID  . naproxen  500 mg Oral BID WC  . pantoprazole  40 mg Oral Daily  . sodium chloride  1,000 mL Intravenous Once  . sodium chloride  3 mL Intravenous Q12H  . traZODone  100 mg Oral QHS  . vancomycin  1,500 mg Intravenous Q12H  . vancomycin  2,000 mg Intravenous Once   Continuous Infusions:   . sodium chloride 1,000 mL (08/25/11 0326)  . sodium chloride      Principal Problem:  *Acute renal failure Active Problems:  Hypotension  Nausea & vomiting  Folliculitis  Chronic pain  Morbid obesity  D-dimer, elevated  EKG: Independently reviewed. Sinus rhythm. No acute changes.

## 2011-08-26 DIAGNOSIS — L738 Other specified follicular disorders: Secondary | ICD-10-CM

## 2011-08-26 DIAGNOSIS — I959 Hypotension, unspecified: Secondary | ICD-10-CM

## 2011-08-26 DIAGNOSIS — N179 Acute kidney failure, unspecified: Secondary | ICD-10-CM

## 2011-08-26 DIAGNOSIS — R112 Nausea with vomiting, unspecified: Secondary | ICD-10-CM

## 2011-08-26 LAB — BASIC METABOLIC PANEL
Calcium: 8.1 mg/dL — ABNORMAL LOW (ref 8.4–10.5)
GFR calc Af Amer: 52 mL/min — ABNORMAL LOW (ref >90)
GFR calc non Af Amer: 45 mL/min — ABNORMAL LOW (ref >90)
Glucose, Bld: 114 mg/dL — ABNORMAL HIGH (ref 70–99)
Potassium: 4.3 mEq/L (ref 3.5–5.1)
Sodium: 140 mEq/L (ref 135–145)

## 2011-08-26 MED ORDER — ENOXAPARIN SODIUM 100 MG/ML ~~LOC~~ SOLN
90.0000 mg | SUBCUTANEOUS | Status: DC
  Filled 2011-08-26 (×3): qty 1

## 2011-08-26 MED ORDER — ALBUTEROL SULFATE (5 MG/ML) 0.5% IN NEBU
2.5000 mg | INHALATION_SOLUTION | Freq: Three times a day (TID) | RESPIRATORY_TRACT | Status: DC
  Administered 2011-08-26 – 2011-08-27 (×4): 2.5 mg via RESPIRATORY_TRACT
  Filled 2011-08-26 (×4): qty 0.5

## 2011-08-26 NOTE — Progress Notes (Signed)
[facility] PROGRESS NOTE  Kara Vincent [id number] DOB: [birth year] DOA: 08/24/2011 PCP: Henderson Baltimore, MD, MD  Assessment/Plan: 1. Nausea, vomiting: Resolved. Etiology unclear--possibly viral. Preceded administration of antibiotics. 2. Acute renal failure: Improving, continue IV fluids. Multifactorial: Vomiting, diuretics, ACE inhibitor. Hold ACE inhibitor and diuretics. 3. Hypotension: Blood pressure remains borderline low. Lactic acid normal. No evidence to suggest sepsis. Probably secondary to dehydration, diuretics. Continue IV fluids. Blood cultures, urinalysis not obtained and as there is no evidence of infection will hold off. 4. Folliculitis: No evidence of cellulitis. Continue clindamycin. Doubt clindamycin resistant MRSA--discontinue vancomycin. 5. Elevated d-dimer: No chest pain. Shortness of breath at baseline. Noted to be elevated 01/15/2011--CT angiogram at that time was negative for pulmonary embolism. Lower extremity Dopplers negative this admission. No history/evidence to suggest pulmonary embolism. 6. OSA: Noncompliant with CPAP at home. CPAP each night.  Code Status: Full code Family Communication: None at bedside Disposition Plan: Home when improved--anticipate and despite 24-48 hours.  Murray Hodgkins, MD  [facility] Pager [phone]. If 8PM-8AM, please contact night-coverage at [email or web address], password [facility] 08/26/2011, 11:59 AM  LOS: 2 days   Brief narrative: 46 year old female with medical problems who is frequently seen in emergency department with folliculitis and abscess he was on antibiotics currently for folliculitis which was diagnosed 2 days ago in emergency department she was started on clindamycin. She's had several episodes of vomiting that has resolved. She comes in because of bilateral lower extremity swelling and pain. She denies any fevers. She is on diuretics and an ACE inhibitor for high blood pressure. She is basically  wheelchair-bound because of her obesity. She denies any abdominal pain chest pain or diarrhea. She does have chronic shortness of breath which is no worse than normal. She is on chronic narcotics for chronic back pain and chronic headaches. We are being asked to admit the patient do to worsening renal failure and borderline hypotension in the emergency department that has been resolving with IV fluids.  Consultants:  None  Procedures:  Bilateral lower extremity venous Doppler: No evidence of DVT.  Antibiotics:  Keflex 6/3?6/3  Clindamycin 6/3?  Vancomycin 6/3?6/4  HPI/Subjective: Afebrile, vital signs stable. Complains of leg pain.  Objective: Filed Vitals:   08/25/11 1400 08/25/11 2022 08/25/11 2333 08/26/11 0357  BP: 104/68  92/62 97/63  Pulse: 66  89 92  Temp: 97 F (36.1 C)  98.7 F (37.1 C) 97.2 F (36.2 C)  TempSrc: Oral  Oral Oral  Resp: 16  16 16   Height:      Weight:    192.461 kg (424 lb 4.8 oz)  SpO2: 91% 93% 98% 91%    Intake/Output Summary (Last 24 hours) at 08/26/11 1159 Last data filed at 08/26/11 0900  Gross per 24 hour  Intake   1550 ml  Output      0 ml  Net   1550 ml    Exam:   General:  Appears calm and comfortable.  Cardiovascular: Distant. Regular rate and rhythm. No murmur, rub, gallop. 1+ bilateral lower extremity edema.  Respiratory: Clear to auscultation bilaterally. No wheezes, rales or rhonchi. Normal respiratory effort.  Abdomen: soft, non-tender, non-distended.  Skin: Folliculitis much improved (noted over bilateral lower extremities). No cellulitis or abscess noted.   Data Reviewed: Basic Metabolic Panel:  Lab [id number] 0440 08/25/11 0525 08/24/11 2200  NA 140 138 136  K 4.3 4.0 4.9  CL 106 104 99  CO2 26 24 28   GLUCOSE 114* 99 99  BUN  21 29* 33*  CREATININE 1.38* 1.66* 2.11*  CALCIUM 8.1* 8.0* 8.9  MG -- -- --  PHOS -- -- --   Liver Function Tests:  Lab 08/24/11 2200  AST 11  ALT 11  ALKPHOS 66  BILITOT 0.4    PROT 7.0  ALBUMIN 3.6    Lab 08/25/11 0208  LIPASE 24  AMYLASE --   CBC:  Lab 08/25/11 0525 08/24/11 2200  WBC 8.9 11.4*  NEUTROABS -- 8.4*  HGB 11.0* 11.8*  HCT 36.2 38.7  MCV 93.1 92.4  PLT 233 272   Cardiac Enzymes:  Lab 08/25/11 0208  CKTOTAL 65  CKMB 1.6  CKMBINDEX --  TROPONINI <0.30   Recent Results (from the past 240 hour(s))  MRSA PCR SCREENING     Status: Abnormal   Collection Time   08/25/11  3:37 AM      Component Value Range Status Comment   MRSA by PCR POSITIVE (*) NEGATIVE  Final     Studies: Dg Chest 2 View  08/24/2011  *RADIOLOGY REPORT*  IMPRESSION: Stable cardiac enlargement. Low lung volumes with vascular crowding and atelectasis.  Original Report Authenticated By: P. Kalman Jewels, M.D.   Scheduled Meds:    . sodium chloride  1,000 mL Intravenous Once  . albuterol  2.5 mg Nebulization TID  . allopurinol  100 mg Oral Daily  . ALPRAZolam  1 mg Oral BID  . aspirin  325 mg Oral Daily  . Chlorhexidine Gluconate Cloth  6 each Topical Q0600  . clindamycin  300 mg Oral QID  . enoxaparin  90 mg Subcutaneous Q24H  . ketoconazole  200 mg Oral Daily  . lubiprostone  24 mcg Oral BID WC  . mupirocin ointment  1 application Nasal BID  . pantoprazole  40 mg Oral Daily  . sodium chloride  3 mL Intravenous Q12H  . traZODone  100 mg Oral QHS  . vitamin A & D      . DISCONTD: albuterol  2.5 mg Nebulization Q6H WA  . DISCONTD: enoxaparin  30 mg Subcutaneous Q24H   Continuous Infusions:    . sodium chloride 1,000 mL (08/25/11 0326)  . sodium chloride 1,000 mL (08/25/11 2201)    Principal Problem:  *Acute renal failure Active Problems:  Hypotension  Nausea & vomiting  Folliculitis  Chronic pain  Morbid obesity  D-dimer, elevated  EKG: Independently reviewed. Sinus rhythm. No acute changes.

## 2011-08-26 NOTE — Progress Notes (Signed)
Lovenox Dose Adjustment  A/P: Patient's Lovenox renally adjusted to 30mg  daily upon admission. Since then, her SCr has improved and estimated CrCl is now 89. Will increase Lovenox to 0.5mg /kg (90mg ) SQ 99991111 per policy for obese patient BMI>30 and continue to monitor for potential other dosing changes  Adrian Saran, PharmD, BCPS Pager [phone] 08/26/2011 9:37 AM

## 2011-08-26 NOTE — Progress Notes (Signed)
Patient refused Lovenox injection,explanation and education done but pt stated " I don't want it and I don't want it".

## 2011-08-27 DIAGNOSIS — R112 Nausea with vomiting, unspecified: Secondary | ICD-10-CM

## 2011-08-27 DIAGNOSIS — L738 Other specified follicular disorders: Secondary | ICD-10-CM

## 2011-08-27 DIAGNOSIS — I959 Hypotension, unspecified: Secondary | ICD-10-CM

## 2011-08-27 DIAGNOSIS — N179 Acute kidney failure, unspecified: Secondary | ICD-10-CM

## 2011-08-27 LAB — BASIC METABOLIC PANEL
BUN: 13 mg/dL (ref 6–23)
GFR calc Af Amer: 75 mL/min — ABNORMAL LOW (ref >90)
GFR calc non Af Amer: 64 mL/min — ABNORMAL LOW (ref >90)
Potassium: 4.1 mEq/L (ref 3.5–5.1)
Sodium: 137 mEq/L (ref 135–145)

## 2011-08-27 NOTE — Progress Notes (Signed)
Report received from A. Abbott Pao. No change in assessment. Stacey Drain

## 2011-08-27 NOTE — Discharge Summary (Signed)
Physician Discharge Summary  Patient ID: Kara Vincent MRN: [id number] DOB/AGE: 04/14/1965 46 y.o.  Admit date: 08/24/2011 Discharge date: 08/27/2011  Primary Care Physician:  Henderson Baltimore, MD, MD  Discharge Diagnoses:    .Acute renal failure resolved  .Chronic pain .Morbid obesity .Nausea & vomiting .Hypotension .Folliculitis .D-dimer, elevated lower extremity Dopplers negative, no chest pain, dyspnea baseline  Consults:  None    Discharge Medications: Medication List  As of 08/27/2011 11:45 AM   STOP taking these medications         cephALEXin 500 MG capsule      cloNIDine 0.2 MG tablet      diphenhydrAMINE 25 MG tablet      lisinopril 20 MG tablet      mupirocin cream 2 %      naproxen 500 MG tablet         TAKE these medications         allopurinol 100 MG tablet   Commonly known as: ZYLOPRIM   Take 1 tablet (100 mg total) by mouth daily.      ALPRAZolam 1 MG tablet   Commonly known as: XANAX   Take 1 mg by mouth 2 (two) times daily. Anxiety and depression      ammonium lactate 12 % cream   Commonly known as: AMLACTIN   Apply topically 2 (two) times daily. In a thin layer on affected area      aspirin 325 MG tablet   Take 325 mg by mouth daily.      atorvastatin 20 MG tablet   Commonly known as: LIPITOR   Take 20 mg by mouth daily.      clindamycin 300 MG capsule   Commonly known as: CLEOCIN   Take 1 capsule (300 mg total) by mouth 4 (four) times daily. X 7 days      diphenhydrAMINE 25 mg capsule   Commonly known as: BENADRYL   Take 25 mg by mouth every 6 (six) hours as needed.      eucerin lotion   Apply topically 2 (two) times daily. On affected areas      famotidine 20 MG tablet   Commonly known as: PEPCID   Take 1 tablet (20 mg total) by mouth 2 (two) times daily.      furosemide 40 MG tablet   Commonly known as: LASIX   Take 40 mg by mouth daily.      HYDROcodone-acetaminophen 10-325 MG per tablet   Commonly known as: NORCO     Take 1 tablet by mouth every 6 (six) hours as needed. For pain      ketoconazole 200 MG tablet   Commonly known as: NIZORAL   Take 200 mg by mouth daily.      lubiprostone 24 MCG capsule   Commonly known as: AMITIZA   Take 24 mcg by mouth 2 (two) times daily with a meal.      oxyCODONE-acetaminophen 5-325 MG per tablet   Commonly known as: PERCOCET   Take 1-2 tablets by mouth every 6 (six) hours as needed for pain.      pantoprazole 40 MG tablet   Commonly known as: PROTONIX   Take 40 mg by mouth daily.      PROVENTIL HFA 108 (90 BASE) MCG/ACT inhaler   Generic drug: albuterol   Inhale 1 puff into the lungs every 4 (four) hours as needed. For asthma relief      albuterol (2.5 MG/3ML) 0.083% nebulizer solution   Commonly known  as: PROVENTIL   Take 3 mLs (2.5 mg total) by nebulization every 6 (six) hours as needed for wheezing.      solifenacin 5 MG tablet   Commonly known as: VESICARE   Take 1 tablet (5 mg total) by mouth daily.      traZODone 50 MG tablet   Commonly known as: DESYREL   Take 2 tablets (100 mg total) by mouth at bedtime.             Brief H and P: For complete details please refer to admission H and P, but in brief 46 year old female with medical problems who is frequently seen in emergency department with folliculitis and abscess, was on antibiotics for folliculitis which was diagnosed 2 days ago in emergency department she was started on clindamycin. She's had several episodes of vomiting that had resolved. She presented because of bilateral lower extremity swelling and pain. She denied any fevers. Patient was noted to be on diuretics and an ACE inhibitor for high blood pressure. She is basically wheelchair-bound because of her obesity. She denied any abdominal pain chest pain or diarrhea. She does have chronic shortness of breath which is no worse than normal. She is on chronic narcotics for chronic back pain and chronic headaches. Hospitalist service was  asked to admit the patient due to worsening renal failure and borderline hypotension in the emergency department.   Hospital Course:  1. Nausea, vomiting: Resolved,  Currently eating regular diet without any difficulty. Etiology unclear--possibly viral. Preceded administration of antibiotics. 2. Acute renal failure:  Resolved IV fluid hydration. Multifactorial: Vomiting, diuretics, ACE inhibitor. ACE inhibitor and diuretics were held. 3. Hypotension: resolved with IV fluid hydration,Lactic acid normal. No evidence to suggest sepsis. Probably secondary to dehydration, diuretics.  Blood cultures, urinalysis were not obtained and as there was no evidence of infection will hold off. 4. Folliculitis: No evidence of cellulitis. Continue clindamycin. Doubt clindamycin resistant MRSA. 5. Elevated d-dimer: No chest pain. Shortness of breath at baseline. Noted to be elevated 01/15/2011--CT angiogram at that time was negative for pulmonary embolism. Lower extremity Dopplers negative this admission. No history/evidence to suggest pulmonary embolism. 6. OSA: Noncompliant with CPAP at home. Placed on CPAP each night. 7. Morbid obesity, debility, wheelchair-bound: Home health was set up for PT, RN for followup and for safety evaluation    Day of Discharge BP 151/92  Pulse 85  Temp(Src) 98.2 F (36.8 C) (Oral)  Resp 20  Ht 5\' 5"  (1.651 m)  Wt 192.098 kg (423 lb 8 oz)  BMI 70.47 kg/m2  SpO2 94%  LMP 03/24/2009  Physical Exam: General: not in any acute distress. HEENT: anicteric sclera, pupils reactive to light and accommodation CVS: S1-S2 clear no murmur rubs or gallops Chest: clear to auscultation bilaterally, no wheezing rales or rhonchi Abdomen: soft nontender, nondistended, normal bowel sounds, no organomegaly Extremities: no cyanosis, clubbing, 1+ edema noted bilaterally Neuro: Cranial nerves II-XII intact, no focal neurological deficits   The results of significant diagnostics from this  hospitalization (including imaging, microbiology, ancillary and laboratory) are listed below for reference.    LAB RESULTS: Basic Metabolic Panel:  Lab [id number] 0444 08/26/11 0440  NA 137 140  K 4.1 4.3  CL 102 106  CO2 28 26  GLUCOSE 120* 114*  BUN 13 21  CREATININE 1.02 1.38*  CALCIUM 8.6 8.1*  MG -- --  PHOS -- --   Liver Function Tests:  Lab 08/24/11 2200  AST 11  ALT 11  ALKPHOS 66  BILITOT 0.4  PROT 7.0  ALBUMIN 3.6    Lab 08/25/11 0208  LIPASE 24  AMYLASE --   CBC:  Lab 08/25/11 0525 08/24/11 2200  WBC 8.9 11.4*  NEUTROABS -- 8.4*  HGB 11.0* 11.8*  HCT 36.2 38.7  MCV 93.1 --  PLT 233 272   Cardiac Enzymes:  Lab 08/25/11 0208  CKTOTAL 65  CKMB 1.6  CKMBINDEX --  TROPONINI <0.30    Significant Diagnostic Studies:  Dg Chest 2 View  08/24/2011  *RADIOLOGY REPORT*  Clinical Data: Chest pain.  CHEST - 2 VIEW  Comparison: 06/28/2011.  Findings: The heart is enlarged but stable.  Stable prominent mediastinal and hilar contours.  Persistent low lung volumes with vascular crowding and atelectasis.  No edema or effusions.  IMPRESSION: Stable cardiac enlargement. Low lung volumes with vascular crowding and atelectasis.  Original Report Authenticated By: P. Kalman Jewels, M.D.     Disposition and Follow-up: Discharge Orders    Future Orders Please Complete By Expires   Diet - low sodium heart healthy      Increase activity slowly          DISPOSITION:  home with home health   DIET:  heart healthy   ACTIVITY:  as tolerated  DISCHARGE FOLLOW-UP Follow-up Information    Follow up with Henderson Baltimore, MD. Schedule an appointment as soon as possible for a visit in 10 days. (for hospital follow-up)          Time spent on Discharge: 45 minutes   Signed:  Antoninette Lerner M.D. [facility] 08/27/2011, 11:45 AM

## 2011-08-27 NOTE — Care Management Note (Signed)
    Page 1 of 1   08/29/2011     4:29:39 PM   CARE MANAGEMENT NOTE 08/29/2011  Patient:  Kara Vincent, Kara Vincent   Account Number:  [id number]  Date Initiated:  08/27/2011  Documentation initiated by:  Dessa Phi  Subjective/Objective Assessment:   ADMITTED [id number] OBESITY     Action/Plan:   FROM HOME   Anticipated DC Date:  08/27/2011   Anticipated DC Plan:  [place]  CM consult      Choice offered to / List presented to:  C-1 Patient        [place] arranged  HH-1 RN  [place].   Status of service:  Completed, signed off Medicare Important Message given?   (If response is "NO", the following Medicare IM given date fields will be blank) Date Medicare IM given:   Date Additional Medicare IM given:    Discharge Disposition:  [place]  Per UR Regulation:  Reviewed for med. necessity/level of care/duration of stay  If discussed at [place] Length of Stay Meetings, dates discussed:    Comments:  08/29/11 4:30P Hagen Bohorquez RN,BSN NCM [phone] RECEIVED CALL FROM [facility]([place])ATTEMPT TO MAKE HOME VISIT YESTERDAY BUT PATIENT WAS NOT [place] ATTEMPT-NOT THERE,&BLOOD IN HALLS,ALONG W/SYRING[place],MD CONTACTED BUT SAYS HE IS NOT THE PCP,HOME NOT SAFE PER [place]. 08/27/11 Sender Rueb RN,BSN NCM [phone] [facility] ([facility])CONTACTED FOR HH,& D/C TODAY.

## 2011-09-01 ENCOUNTER — Emergency Department (HOSPITAL_COMMUNITY)
Admission: EM | Admit: 2011-09-01 | Discharge: 2011-09-02 | Disposition: A | Discharge status: Home / Self Care | Payer: Medicaid Other | Attending: Emergency Medicine | Admitting: Emergency Medicine

## 2011-09-01 ENCOUNTER — Encounter (HOSPITAL_COMMUNITY): Payer: Self-pay | Admitting: *Deleted

## 2011-09-01 DIAGNOSIS — S8010XA Contusion of unspecified lower leg, initial encounter: Secondary | ICD-10-CM | POA: Insufficient documentation

## 2011-09-01 DIAGNOSIS — X58XXXA Exposure to other specified factors, initial encounter: Secondary | ICD-10-CM | POA: Insufficient documentation

## 2011-09-01 DIAGNOSIS — Q828 Other specified congenital malformations of skin: Secondary | ICD-10-CM | POA: Insufficient documentation

## 2011-09-01 DIAGNOSIS — Z5900 Homelessness unspecified: Secondary | ICD-10-CM | POA: Insufficient documentation

## 2011-09-01 DIAGNOSIS — J45909 Unspecified asthma, uncomplicated: Secondary | ICD-10-CM | POA: Insufficient documentation

## 2011-09-01 DIAGNOSIS — Z87891 Personal history of nicotine dependence: Secondary | ICD-10-CM | POA: Insufficient documentation

## 2011-09-01 DIAGNOSIS — I10 Essential (primary) hypertension: Secondary | ICD-10-CM | POA: Insufficient documentation

## 2011-09-01 DIAGNOSIS — T148XXA Other injury of unspecified body region, initial encounter: Secondary | ICD-10-CM

## 2011-09-01 DIAGNOSIS — R339 Retention of urine, unspecified: Secondary | ICD-10-CM | POA: Insufficient documentation

## 2011-09-01 DIAGNOSIS — K219 Gastro-esophageal reflux disease without esophagitis: Secondary | ICD-10-CM | POA: Insufficient documentation

## 2011-09-01 MED ORDER — OXYCODONE-ACETAMINOPHEN 5-325 MG PO TABS
2.0000 | ORAL_TABLET | Freq: Once | ORAL | Status: AC
  Administered 2011-09-02: 2 via ORAL
  Filled 2011-09-01: qty 2

## 2011-09-01 NOTE — ED Notes (Signed)
Patient going to restroom at this time. Patient refusing to use female urinal for urine sample. No hat's available at this time for patients use.

## 2011-09-01 NOTE — ED Notes (Signed)
Pt c/o feeling like her kidneys are shutting down; states isn't urinating enough even though she is drinking plenty; c/o right leg swelling/pain

## 2011-09-01 NOTE — ED Notes (Signed)
Patient sts she has also been drinking lots of fluids yet still is not urinating as much as she feels like she should. Patient sts she has been taking her medications as prescribed.

## 2011-09-01 NOTE — ED Provider Notes (Signed)
History     CSN: [id number]  Arrival date & time 09/01/11  2236   First MD Initiated Contact with Patient 09/01/11 2320      Chief Complaint  Patient presents with  . Urinary Retention   HPI  History provided by the patient. Patient is a 46 year old female with history of hypertension, asthma, morbid obesity who presents with complaints of feelings of urinary retention. States that she feels like her urinary frequency has decreased over the last 3 or 4 days. Patient states she has been drinking increased amounts of fluid without any changes in her urination. Patient also complains of some fullness to her bladder. She denies any turning or dysuria. She denies any hematuria or flank pain. Patient has a second complaint of right lower extremity pains. Patient states she has swelling over her shin area with tenderness. She also complains of soreness and aches of both of her legs. Patient's bilateral pains are chronic and she reports that she's been seen in the past for these symptoms. She denies any new injury to her right leg. Patient normally uses a motorized scooter to be around. Symptoms are described as severe. Patient denies any other aggravating or alleviating factors. She denies any other associated symptoms. She denies any fever, chills, sweats, nausea vomiting.     Past Medical History  Diagnosis Date  . Asthma   . GERD (gastroesophageal reflux disease)   . Hypertension   . Homelessness   . Low back pain   . 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 Tubal ligation   . Cesarean section     Family History  Problem Relation Age of Onset  . Asthma Mother   . Diabetes Father   . Heart disease Father   . Stroke Father     History  Substance Use Topics  . Smoking status: Former Smoker    Types: Cigarettes    Quit date: 03/25/2003  . Smokeless tobacco: Former Systems developer    Quit date: 05/27/1978  . Alcohol Use: No    OB  History    Grav Para Term Preterm Abortions TAB SAB Ect Mult Living                  Review of Systems  Constitutional: Negative for fever and chills.  Gastrointestinal: Negative for nausea, vomiting and abdominal pain.  Genitourinary: Positive for decreased urine volume and difficulty urinating. Negative for dysuria, frequency, hematuria and flank pain.  Musculoskeletal: Positive for myalgias.       Right lower leg pain  Skin: Positive for rash.    Allergies  Azithromycin; Doxycycline; Ibuprofen; and Sulfamethoxazole w-trimethoprim  Home Medications   Current Outpatient Rx  Name Route Sig Dispense Refill  . ALBUTEROL SULFATE HFA 108 (90 BASE) MCG/ACT IN AERS Inhalation Inhale 1 puff into the lungs every 4 (four) hours as needed. For asthma relief    . ALBUTEROL SULFATE (2.5 MG/3ML) 0.083% IN NEBU Nebulization Take 3 mLs (2.5 mg total) by nebulization every 6 (six) hours as needed for wheezing. 75 mL 12  . ALLOPURINOL 100 MG PO TABS Oral Take 1 tablet (100 mg total) by mouth daily. 30 tablet 2  . ALPRAZOLAM 1 MG PO TABS Oral Take 1 mg by mouth 2 (two) times daily. Anxiety and depression    . AMMONIUM LACTATE 12 % EX CREA Topical Apply topically 2 (two) times daily. In a thin layer on  affected area     . ASPIRIN 325 MG PO TABS Oral Take 325 mg by mouth daily.    . ATORVASTATIN CALCIUM 20 MG PO TABS Oral Take 20 mg by mouth daily.    [name] DIPHENHYDRAMINE HCL 25 MG PO CAPS Oral Take 25 mg by mouth every 6 (six) hours as needed.    Ree Kida EX LOTN Topical Apply topically 2 (two) times daily. On affected areas     . FAMOTIDINE 20 MG PO TABS Oral Take 1 tablet (20 mg total) by mouth 2 (two) times daily. 30 tablet 0  . FUROSEMIDE 40 MG PO TABS Oral Take 40 mg by mouth daily.    [name] HYDROCODONE-ACETAMINOPHEN 10-325 MG PO TABS Oral Take 1 tablet by mouth every 6 (six) hours as needed. For pain    . KETOCONAZOLE 200 MG PO TABS Oral Take 200 mg by mouth daily.    . LUBIPROSTONE 24 MCG PO CAPS  Oral Take 24 mcg by mouth 2 (two) times daily with a meal.    . PANTOPRAZOLE SODIUM 40 MG PO TBEC Oral Take 40 mg by mouth daily.    [name] SOLIFENACIN SUCCINATE 5 MG PO TABS Oral Take 1 tablet (5 mg total) by mouth daily. 30 tablet 11  . TRAZODONE HCL 50 MG PO TABS Oral Take 2 tablets (100 mg total) by mouth at bedtime. 60 tablet 3    BP 154/81  Pulse 64  Temp(Src) 99 F (37.2 C) (Oral)  Resp 22  SpO2 95%  LMP 03/24/2009  Physical Exam  Nursing note and vitals reviewed. Constitutional: She is oriented to person, place, and time. She appears well-developed and well-nourished. No distress.  HENT:  Head: Normocephalic.  Cardiovascular: Normal rate and regular rhythm.   Pulmonary/Chest: Effort normal and breath sounds normal.  Abdominal: Soft. There is tenderness in the suprapubic area. There is no rebound and no guarding.       Morbidly obese. Mild suprapubic tenderness.  Musculoskeletal:       Mild swelling to anterior lateral right lower leg over the shin area with slight overlying ecchymoses. Area is tender to palpation. Findings consistent with contusion. No bony tenderness or deformities. Normal range of motion her knee and ankle. Normal sensations and feet with normal dorsal pedal pulse. [name]  Neurological: She is alert and oriented to person, place, and time.  Skin: Skin is warm and dry.       Fading maculopapular rash on lower extremities.  Psychiatric: She has a normal mood and affect. Her behavior is normal.    ED Course  Procedures    Results for orders placed during the hospital encounter of [id number]  BASIC METABOLIC PANEL      Component Value Range   Sodium 143  135 - 145 (mEq/L)   Potassium 3.6  3.5 - 5.1 (mEq/L)   Chloride 102  96 - 112 (mEq/L)   CO2 31  19 - 32 (mEq/L)   Glucose, Bld 85  70 - 99 (mg/dL)   BUN 10  6 - 23 (mg/dL)   Creatinine, Ser 1.20 (*) 0.50 - 1.10 (mg/dL)   Calcium 8.8  8.4 - 10.5 (mg/dL)   GFR calc non Af Amer 53 (*) >90 (mL/min)   GFR calc Af  Amer 61 (*) >90 (mL/min)  URINALYSIS, ROUTINE W REFLEX MICROSCOPIC      Component Value Range   Color, Urine YELLOW  YELLOW    APPearance CLEAR  CLEAR    Specific Gravity, Urine 1.032 (*)  1.005 - 1.030    pH 5.5  5.0 - 8.0    Glucose, UA NEGATIVE  NEGATIVE (mg/dL)   Hgb urine dipstick NEGATIVE  NEGATIVE    Bilirubin Urine NEGATIVE  NEGATIVE    Ketones, ur NEGATIVE  NEGATIVE (mg/dL)   Protein, ur NEGATIVE  NEGATIVE (mg/dL)   Urobilinogen, UA 0.2  0.0 - 1.0 (mg/dL)   Nitrite NEGATIVE  NEGATIVE    Leukocytes, UA NEGATIVE  NEGATIVE   CBC      Component Value Range   WBC 10.0  4.0 - 10.5 (K/uL)   RBC 4.35  3.87 - 5.11 (MIL/uL)   Hemoglobin 12.1  12.0 - 15.0 (g/dL)   HCT 40.3  36.0 - 46.0 (%)   MCV 92.6  78.0 - 100.0 (fL)   MCH 27.8  26.0 - 34.0 (pg)   MCHC 30.0  30.0 - 36.0 (g/dL)   RDW 14.2  11.5 - 15.5 (%)   Platelets 286  150 - 400 (K/uL)  DIFFERENTIAL      Component Value Range   Neutrophils Relative 62  43 - 77 (%)   Neutro Abs 6.2  1.7 - 7.7 (K/uL)   Lymphocytes Relative 28  12 - 46 (%)   Lymphs Abs 2.8  0.7 - 4.0 (K/uL)   Monocytes Relative 6  3 - 12 (%)   Monocytes Absolute 0.6  0.1 - 1.0 (K/uL)   Eosinophils Relative 4  0 - 5 (%)   Eosinophils Absolute 0.4  0.0 - 0.7 (K/uL)   Basophils Relative 0  0 - 1 (%)   Basophils Absolute 0.0  0.0 - 0.1 (K/uL)       1. Contusion       MDM  Patient seen and evaluated. Patient in no acute distress.        Martie Lee, [place] 09/02/11 [phone]

## 2011-09-02 LAB — DIFFERENTIAL
Basophils Absolute: 0 10*3/uL (ref 0.0–0.1)
Basophils Relative: 0 % (ref 0–1)
Neutro Abs: 6.2 10*3/uL (ref 1.7–7.7)
Neutrophils Relative %: 62 % (ref 43–77)

## 2011-09-02 LAB — URINALYSIS, ROUTINE W REFLEX MICROSCOPIC
Ketones, ur: NEGATIVE mg/dL
Leukocytes, UA: NEGATIVE
Nitrite: NEGATIVE
Protein, ur: NEGATIVE mg/dL
pH: 5.5 (ref 5.0–8.0)

## 2011-09-02 LAB — BASIC METABOLIC PANEL
Chloride: 102 mEq/L (ref 96–112)
Creatinine, Ser: 1.2 mg/dL — ABNORMAL HIGH (ref 0.50–1.10)
GFR calc Af Amer: 61 mL/min — ABNORMAL LOW (ref >90)
Sodium: 143 mEq/L (ref 135–145)

## 2011-09-02 LAB — CBC
Hemoglobin: 12.1 g/dL (ref 12.0–15.0)
MCHC: 30 g/dL (ref 30.0–36.0)
RDW: 14.2 % (ref 11.5–15.5)

## 2011-09-02 MED ORDER — OXYCODONE-ACETAMINOPHEN 5-325 MG PO TABS: 1.0000 | ORAL_TABLET | Freq: Four times a day (QID) | ORAL | Status: AC | PRN

## 2011-09-02 MED ORDER — KETOROLAC TROMETHAMINE 30 MG/ML IJ SOLN
30.0000 mg | Freq: Once | INTRAMUSCULAR | Status: AC
  Administered 2011-09-02: 30 mg via INTRAMUSCULAR
  Filled 2011-09-02: qty 1

## 2011-09-02 MED ORDER — DIPHENHYDRAMINE HCL 25 MG PO CAPS
25.0000 mg | ORAL_CAPSULE | Freq: Once | ORAL | Status: AC
  Administered 2011-09-02: 25 mg via ORAL
  Filled 2011-09-02: qty 1

## 2011-09-02 NOTE — ED Notes (Signed)
Pt given 3 drinks to help her urinate.

## 2011-09-02 NOTE — ED Notes (Signed)
Patient calling out for pain medication and benedryl. Patient informed that I am working on contacting the MD/PA for her concerns.

## 2011-09-02 NOTE — ED Notes (Signed)
PA at bedside.

## 2011-09-02 NOTE — ED Notes (Signed)
Bladder scan complete. 58ml visualized in patients bladder. Bladder scan done twice to verify presence of 59ml.

## 2011-09-02 NOTE — Discharge Instructions (Signed)
You were seen and evaluated for your symptoms of decreased urination. Your lab testing today and urine tests do not show any signs for concerning cause of your symptoms. There were no signs for a urine infection. You're also evaluated for your soreness on your lower leg. Providers believe this was caused from a contusion and small bruising. This should improve with time. He may use rest, ice, compression and elevation to reduce swelling and pain.   Contusion A contusion is a deep bruise. Contusions are the result of an injury that caused bleeding under the skin. The contusion may turn blue, purple, or yellow. Minor injuries will give you a painless contusion, but more severe contusions may stay painful and swollen for a few weeks.  CAUSES  A contusion is usually caused by a blow, trauma, or direct force to an area of the body. SYMPTOMS   Swelling and redness of the injured area.   Bruising of the injured area.   Tenderness and soreness of the injured area.   Pain.  DIAGNOSIS  The diagnosis can be made by taking a history and physical exam. An X-ray, CT scan, or MRI may be needed to determine if there were any associated injuries, such as fractures. TREATMENT  Specific treatment will depend on what area of the body was injured. In general, the best treatment for a contusion is resting, icing, elevating, and applying cold compresses to the injured area. Over-the-counter medicines may also be recommended for pain control. Ask your caregiver what the best treatment is for your contusion. HOME CARE INSTRUCTIONS   Put ice on the injured area.   Put ice in a plastic bag.   Place a towel between your skin and the bag.   Leave the ice on for 15 to 20 minutes, 3 to 4 times a day.   Only take over-the-counter or prescription medicines for pain, discomfort, or fever as directed by your caregiver. Your caregiver may recommend avoiding anti-inflammatory medicines (aspirin, ibuprofen, and naproxen)  for 48 hours because these medicines may increase bruising.   Rest the injured area.   If possible, elevate the injured area to reduce swelling.  SEEK IMMEDIATE MEDICAL CARE IF:   You have increased bruising or swelling.   You have pain that is getting worse.   Your swelling or pain is not relieved with medicines.  MAKE SURE YOU:   Understand these instructions.   Will watch your condition.   Will get help right away if you are not doing well or get worse.  Document Released: 12/18/2004 Document Revised: 02/27/2011 Document Reviewed: 01/13/2011 [facility] Patient Information 2012 [place], [place].    RESOURCE GUIDE  Chronic Pain Problems: Contact [facility]  [phone] Patients need to be referred by their primary care doctor.  Insufficient Money for Medicine: Contact [employer] Way:  call "211" or [place] [phone].  No Primary Care Doctor: - Call [facility]  [phone] - can help you locate a primary care doctor that  accepts your insurance, provides certain services, etc. - Physician Referral [phone]  Agencies that provide inexpensive medical care: - [facility]  [facility]  [phone] - [facility]  [phone] - [facility]  [phone] - Planned Parenthood  [phone] - [facility]  [phone]  [place] Providers: - [facility]- [address]  [phone], Mon-Fri 9am-7pm, Sat 9am-1pm - [phone]  [place], [place], [address]  [phone] [facility]- [address]  [place]- [place], [address], [phone]  Only accepts [place] patients after they have their name  applied to their card  Self Pay (no insurance) in [facility]: - Sickle Cell Patients: Dr Kevan Ny, [facility]  [place], [facility]- [place]  [facility]- [id number] [place], [facility]- see information above (Speak to [employer] if you do not have insurance)       -  [facility]- [place], [place],  [place], [place]  Dr Vista Lawman-  [address], [place], [place] [facility]- [address], [phone]       -  [facility]- [address], [place], also [address], [phone]       -    [facility]- [address], [place], 1st & 3rd Saturday   every month, 10am-1pm  1) Find a Doctor and Pay Out of Pocket Although you won't have to find out who is covered by your insurance plan, it is a good idea to ask around and get recommendations. You will then need to call the office and see if the doctor you have chosen will accept you as a new patient and what types of options they offer for patients who are self-pay. Some doctors offer discounts or will set up payment plans for their patients who do not have insurance, but you will need to ask so you aren't surprised when you get to your appointment.  2) Contact Your Local Health Department Not all health departments have doctors that can see patients for sick visits, but many do, so it is worth a call to see if yours does. If you don't know where your local health department is, you can check in your phone book. The CDC also has a tool to help you locate your state's health department, and many state websites also have listings of all of their local health departments.  3) Find a [facility] If your illness is not likely to be very severe or complicated, you  may want to try a walk in clinic. These are popping up all over the country in pharmacies, drugstores, and shopping centers. They're usually staffed by nurse practitioners or physician assistants that have been trained to treat common illnesses and complaints. They're usually fairly quick and inexpensive. However, if you have serious medical issues or chronic medical problems, these are probably not your best option  STD Testing - [place], [facility], [address], [place], phone [phone] or [phone].  Monday - Friday, call for an appointment. - [place], [facility], [address], [place], phone [phone] or [phone].  Monday - Friday,  call for an appointment.  Abuse/Neglect: - [place] [phone] - [place] [phone] (After Hours)  Emergency Shelter:  [facility] [phone]  Maternity Homes: - Room at the [place] [phone] - [place] [phone]  MRSA Hotline #:   651-052-8870  [facility]. [address]         [place] Phone:  [phone]                                  Phone:  [phone]                   Phone:  [phone]  [place], [place] in [place], [address]                                  [place] [phone] or [phone] (After Hours)   [place]  Substance Abuse  Resources: - Alcohol and Drug Services  [phone] - [place] [phone] - [facility] [place] [phone] - Residential & Outpatient Substance Abuse Program  [phone]  Psychological Services: - [place]  [place], [phone] [place]. [address] [place], [place]: [phone] or [phone], [employer]  Dental Assistance  If unable to pay or uninsured, contact:  [facility] or [facility]. to become qualified for the adult dental clinic.  Patients with Medicaid: [facility] [phone], [place] [address], [phone]  If unable to pay, or uninsured, contact [facility] [phone]) or [place] [phone] in [place], [place] in [facility]) to become qualified for the adult dental clinic  Other [place]- [place], [place], [place], [address], [place], [place], 2nd and 4th Thursday of the month at 6:30am.  10 clients each day by appointment, can sometimes see walk-in patients if someone does not show for an appointment. [facility]- [address] [place], [place], [address], [place], [place], [place], [address], [place] Department- [place] Department- [place] Department- [phone]

## 2011-09-02 NOTE — ED Provider Notes (Signed)
Medical screening examination/treatment/procedure(s) were performed by non-physician practitioner and as supervising physician I was immediately available for consultation/collaboration.   Jonet Mathies B. Karle Starch, MD 09/02/11 2123

## 2011-09-02 NOTE — ED Notes (Signed)
Patient given discharge instructions, information, prescriptions, and diet order. Patient states that they adequately understand discharge information given and to return to ED if symptoms return or worsen.    Patient given 1 Rx. Discharge to lobby at this time due to not being able to find a ride.

## 2011-09-03 ENCOUNTER — Encounter (HOSPITAL_COMMUNITY): Payer: Self-pay | Admitting: Emergency Medicine

## 2011-09-03 ENCOUNTER — Emergency Department (HOSPITAL_COMMUNITY)
Admission: EM | Admit: 2011-09-03 | Discharge: 2011-09-04 | Disposition: A | Discharge status: Home / Self Care | Payer: Medicaid Other | Attending: Emergency Medicine | Admitting: Emergency Medicine

## 2011-09-03 ENCOUNTER — Emergency Department (HOSPITAL_COMMUNITY): Payer: Medicaid Other

## 2011-09-03 DIAGNOSIS — M79606 Pain in leg, unspecified: Secondary | ICD-10-CM

## 2011-09-03 DIAGNOSIS — J45909 Unspecified asthma, uncomplicated: Secondary | ICD-10-CM | POA: Insufficient documentation

## 2011-09-03 DIAGNOSIS — R0602 Shortness of breath: Secondary | ICD-10-CM | POA: Insufficient documentation

## 2011-09-03 DIAGNOSIS — Z79899 Other long term (current) drug therapy: Secondary | ICD-10-CM | POA: Insufficient documentation

## 2011-09-03 DIAGNOSIS — M7989 Other specified soft tissue disorders: Secondary | ICD-10-CM | POA: Insufficient documentation

## 2011-09-03 DIAGNOSIS — Q828 Other specified congenital malformations of skin: Secondary | ICD-10-CM | POA: Insufficient documentation

## 2011-09-03 DIAGNOSIS — R062 Wheezing: Secondary | ICD-10-CM

## 2011-09-03 DIAGNOSIS — K219 Gastro-esophageal reflux disease without esophagitis: Secondary | ICD-10-CM | POA: Insufficient documentation

## 2011-09-03 DIAGNOSIS — M79609 Pain in unspecified limb: Secondary | ICD-10-CM | POA: Insufficient documentation

## 2011-09-03 DIAGNOSIS — I10 Essential (primary) hypertension: Secondary | ICD-10-CM | POA: Insufficient documentation

## 2011-09-03 DIAGNOSIS — Z7982 Long term (current) use of aspirin: Secondary | ICD-10-CM | POA: Insufficient documentation

## 2011-09-03 LAB — BASIC METABOLIC PANEL
BUN: 23 mg/dL (ref 6–23)
Chloride: 100 mEq/L (ref 96–112)
GFR calc Af Amer: 58 mL/min — ABNORMAL LOW (ref >90)
GFR calc non Af Amer: 50 mL/min — ABNORMAL LOW (ref >90)
Glucose, Bld: 101 mg/dL — ABNORMAL HIGH (ref 70–99)
Potassium: 3.6 mEq/L (ref 3.5–5.1)
Sodium: 140 mEq/L (ref 135–145)

## 2011-09-03 MED ORDER — ALBUTEROL SULFATE HFA 108 (90 BASE) MCG/ACT IN AERS
2.0000 | INHALATION_SPRAY | RESPIRATORY_TRACT | Status: AC
  Administered 2011-09-03 (×2): 2 via RESPIRATORY_TRACT
  Filled 2011-09-03: qty 6.7

## 2011-09-03 MED ORDER — HYDROCODONE-ACETAMINOPHEN 5-325 MG PO TABS
2.0000 | ORAL_TABLET | Freq: Once | ORAL | Status: AC
  Administered 2011-09-03: 2 via ORAL
  Filled 2011-09-03: qty 2

## 2011-09-03 MED ORDER — HYDROCODONE-ACETAMINOPHEN 5-325 MG PO TABS
ORAL_TABLET | ORAL | Status: AC
  Filled 2011-09-03: qty 1

## 2011-09-03 NOTE — ED Notes (Addendum)
Pt upset that she is not being admitted to hospital. Dr. Carita Pian speaking with pt. Explaining that she does not have cellulitis due to bilateral leg swelling, explaining lab results and dietary changes and reasons they do not meet admission.  Offering more lasix before leaving. Pt requesting Rx for pain medication. Dr. Carita Pian explained that we will give pt more lasix and he will write for a small dose of pain medication. Pt verbalized understanding.

## 2011-09-03 NOTE — ED Notes (Addendum)
Pt updated on care. States her oxygen sats run low.  In no acute distress. Breathing easily.

## 2011-09-03 NOTE — ED Notes (Signed)
O2 sats decreased to 50s. Placed on 4L O2. Pt uses CPAP at home to sleep.

## 2011-09-03 NOTE — ED Notes (Signed)
Pt c/o pain in bilateral legs.

## 2011-09-03 NOTE — ED Notes (Signed)
Pt c/o right leg pain and swelling x 1 week. Pt also reports having difficulty urinating.

## 2011-09-03 NOTE — ED Provider Notes (Signed)
History     CSN: [id number]  Arrival date & time 09/03/11  1432   First MD Initiated Contact with Patient 09/03/11 2040      Chief Complaint  Patient presents with  . Leg Pain    right leg  . Dysuria    (Consider location/radiation/quality/duration/timing/severity/associated sxs/prior treatment) HPI  46-year-old female, past medical history of morbid obesity, hypertension, GERD, asthma, low back pain, leg swelling with recent cellulitis treatment, and recent treatment for renal failure, presents today with a four-day history of shortness of breath, increasing bilateral leg swelling and leg pain, decreased urine output and variable blood pressure. Denies any infectious symptomatology including fever chills or cough, denies any chest pain weakness or numbness. She calls her illness moderate to severe. Movement makes her chronic back pain worse, nothing helps her hurts her chronic bilateral lower leg swelling and pain.  Vital signs were normal on arrival.  Past Medical History  Diagnosis Date  . Asthma   . GERD (gastroesophageal reflux disease)   . Hypertension   . Homelessness   . Low back pain   . 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 Tubal ligation   . Cesarean section     Family History  Problem Relation Age of Onset  . Asthma Mother   . Diabetes Father   . Heart disease Father   . Stroke Father     History  Substance Use Topics  . Smoking status: Former Smoker    Types: Cigarettes    Quit date: 03/25/2003  . Smokeless tobacco: Former Systems developer    Quit date: 05/27/1978  . Alcohol Use: No    OB History    Grav Para Term Preterm Abortions TAB SAB Ect Mult Living                  Review of Systems Constitutional: Negative for fever and chills.  HENT: Negative for ear pain, sore throat and trouble swallowing.   Eyes: Negative for pain and visual disturbance.  Respiratory: Negative for cough and POS  shortness of breath.   Cardiovascular: Negative for chest pain and leg swelling.  Gastrointestinal: Negative for nausea, vomiting, abdominal pain and diarrhea.  Genitourinary: Negative for dysuria, urgency and frequency.  Musculoskeletal: Negative for back pain and joint swelling. Positive bilateral lower extremity pain Skin: Irritated follicullae noted posterior lower extremity.  Very mild surface erythema inferior to right patella.  Neurological: Negative for dizziness, syncope, speech difficulty, weakness and numbness.    Allergies  Azithromycin; Doxycycline; Ibuprofen; and Sulfamethoxazole w-trimethoprim  Home Medications   Current Outpatient Rx  Name Route Sig Dispense Refill  . ALBUTEROL SULFATE HFA 108 (90 BASE) MCG/ACT IN AERS Inhalation Inhale 1 puff into the lungs every 4 (four) hours as needed. For asthma relief    . ALBUTEROL SULFATE (2.5 MG/3ML) 0.083% IN NEBU Nebulization Take 3 mLs (2.5 mg total) by nebulization every 6 (six) hours as needed for wheezing. 75 mL 12  . ALLOPURINOL 100 MG PO TABS Oral Take 1 tablet (100 mg total) by mouth daily. 30 tablet 2  . ALPRAZOLAM 1 MG PO TABS Oral Take 1 mg by mouth 2 (two) times daily. Anxiety and depression    . AMMONIUM LACTATE 12 % EX CREA Topical Apply topically 2 (two) times daily. In a thin layer on affected area     . ASPIRIN 325 MG PO TABS Oral Take 325 mg by  mouth daily.    . ATORVASTATIN CALCIUM 20 MG PO TABS Oral Take 20 mg by mouth daily.    [name] DIPHENHYDRAMINE HCL 25 MG PO CAPS Oral Take 25 mg by mouth every 6 (six) hours as needed.    Ree Kida EX LOTN Topical Apply topically 2 (two) times daily. On affected areas     . FAMOTIDINE 20 MG PO TABS Oral Take 1 tablet (20 mg total) by mouth 2 (two) times daily. 30 tablet 0  . FUROSEMIDE 40 MG PO TABS Oral Take 40 mg by mouth daily.    [name] HYDROCODONE-ACETAMINOPHEN 10-325 MG PO TABS Oral Take 1 tablet by mouth every 6 (six) hours as needed. For pain    . KETOCONAZOLE 200 MG PO  TABS Oral Take 200 mg by mouth daily.    . LUBIPROSTONE 24 MCG PO CAPS Oral Take 24 mcg by mouth 2 (two) times daily with a meal.    . OXYCODONE-ACETAMINOPHEN 5-325 MG PO TABS Oral Take 1-2 tablets by mouth every 6 (six) hours as needed for pain. 15 tablet 0  . PANTOPRAZOLE SODIUM 40 MG PO TBEC Oral Take 40 mg by mouth daily.    [name] SOLIFENACIN SUCCINATE 5 MG PO TABS Oral Take 1 tablet (5 mg total) by mouth daily. 30 tablet 11  . TRAZODONE HCL 50 MG PO TABS Oral Take 2 tablets (100 mg total) by mouth at bedtime. 60 tablet 3  . OXYCODONE-ACETAMINOPHEN 5-325 MG PO TABS Oral Take 2 tablets by mouth every 4 (four) hours as needed for pain. 15 tablet 0    BP 142/83  Pulse 87  Temp 98.7 F (37.1 C) (Oral)  Resp 17  SpO2 97%  LMP 03/24/2009  Physical Exam Consitutional: Pt in no acute distress.   Head: Normocephalic and atraumatic.  Eyes: Extraocular motion intact, no scleral icterus Neck: Supple without meningismus, mass, or overt JVD Respiratory: Effort normal, BSs diffuse wheezing.  No respiratory distress. CV: Heart regular rate and rhythm, no obvious murmurs.  Pulses +2 and symmetric Abdomen: Soft, non-tender, obese.  MSK: Grossly edematous legs bilaterally. Folliculitis noted on most posterior surfaces of legs. Area of erythema below the right patella likely location of recent cellulitis. This is mild erythema Skin: Warm, dry, intact Neuro: Alert and oriented, no motor deficit noted.   CNs 2-12 normal.  Psychiatric: Mood and affect are normal    ED Course  Procedures (including critical care time)  Labs Reviewed  BASIC METABOLIC PANEL - Abnormal; Notable for the following:    Glucose, Bld 101 (*)     Creatinine, Ser 1.25 (*)     GFR calc non Af Amer 50 (*)     GFR calc Af Amer 58 (*)     All other components within normal limits  PRO B NATRIURETIC PEPTIDE   Dg Chest 2 View  09/03/2011  *RADIOLOGY REPORT*  Clinical Data: Shortness of breath and left chest pain for 3-4 days.   Bilateral leg pain.  Former smoker.  CHEST - 2 VIEW  Comparison: 08/24/2011  Findings: Cardiomegaly.  Mild vascular congestion. Early congestive failure not excluded.  Similar appearance to priors.  IMPRESSION: Cardiomegaly.  Mild vascular congestion.  Early CHF not excluded.  Original Report Authenticated By: Staci Righter, M.D.     1. Leg swelling   2. Wheezing   3. Leg pain       MDM  The patient's lower leg pain is chief complaint today. Exam as per above, with symmetrically edematous  legs. No symptoms of active cellulitis of the legs--very mild erythema of right anterior tibia not consistent with infection. Chronic folliculitis. Her shortness of breath is likely due to her wheezing. Albuterol treatment. We'll complete screening chest x-ray for failure. We'll monitor her blood pressure here towards her concern for labile blood pressure. Given her normal vital signs, we'll not pursue pulmonary embolus diagnosed at this time. Will complete basic blood work to rule out renal dysfunction.   Chest x-ray shows cardiomegaly. Screening BMP completed. Normal.  Patient given additional Lasix.  Patient given analgesia for leg pain.  Patient discharged home to followup with primary care. PT DC home stable.  Discussed with pt the clinical impression, treatment in the ED, and follow up plan.  We alslo discussed the indications for returning to the ED, which include shortness or breath, confusion, fever, new weakness or numbness, chest pain, or any other concerning symptom.  The pt understood the treatment and plan, is stable, and is able to leave the ED.         Edwin Cap, MD 09/04/11 [phone]

## 2011-09-03 NOTE — Discharge Instructions (Signed)
Continue taking her diuretic medications, Lasix. Followup with your primary care doctor. Use your inhaler as needed. See your doctor immediately--or return to the ED--with any new or troubling symptoms including fevers, weakness, new chest pain, shortness or breath, numbness, or any other concerning symptom.  Bronchospasm   A bronchospasm is when the tubes that carry air in and out of your lungs (bronchioles) become smaller. It is hard to breathe when this happens. A bronchospasm can be caused by:  Asthma.   Allergies.   Lung infection.  HOME CARE   Do not  smoke. Avoid places that have secondhand smoke.   Dust your house often. Have your air ducts cleaned once or twice a year.   Find out what allergies may cause your bronchospasms.   Use your inhaler properly if you have one. Know when to use it.   Eat healthy foods and drink plenty of water.   Only take medicine as told by your doctor.  GET HELP RIGHT AWAY IF:  You feel you cannot breathe or catch your breath.   You cannot stop coughing.   Your treatment is not helping you breathe better.  MAKE SURE YOU:   Understand these instructions.   Will watch your condition.   Will get help right away if you are not doing well or get worse.  Document Released: 01/05/2009 Document Revised: 02/27/2011 Document Reviewed: 01/05/2009 [facility] Patient Information 2012 [place].  Peripheral Edema You have swelling in your legs (peripheral edema). This swelling is due to excess accumulation of salt and water in your body. Edema may be a sign of heart, kidney or liver disease, or a side effect of a medication. It may also be due to problems in the leg veins. Elevating your legs and using special support stockings may be very helpful, if the cause of the swelling is due to poor venous circulation. Avoid long periods of standing, whatever the cause. Treatment of edema depends on identifying the cause. Chips, pretzels, pickles and other  salty foods should be avoided. Restricting salt in your diet is almost always needed. Water pills (diuretics) are often used to remove the excess salt and water from your body via urine. These medicines prevent the kidney from reabsorbing sodium. This increases urine flow. Diuretic treatment may also result in lowering of potassium levels in your body. Potassium supplements may be needed if you have to use diuretics daily. Daily weights can help you keep track of your progress in clearing your edema. You should call your caregiver for follow up care as recommended. SEEK IMMEDIATE MEDICAL CARE IF:   You have increased swelling, pain, redness, or heat in your legs.   You develop shortness of breath, especially when lying down.   You develop chest or abdominal pain, weakness, or fainting.   You have a fever.  Document Released: 04/17/2004 Document Revised: 02/27/2011 Document Reviewed: 03/28/2009 [facility] Patient Information 2012 [place].

## 2011-09-04 MED ORDER — OXYCODONE-ACETAMINOPHEN 5-325 MG PO TABS: 2.0000 | ORAL_TABLET | ORAL | Status: DC | PRN

## 2011-09-04 MED ORDER — HYDROMORPHONE HCL PF 1 MG/ML IJ SOLN: 1.0000 mg | Freq: Once | INTRAMUSCULAR | Status: DC

## 2011-09-04 MED ORDER — FUROSEMIDE 20 MG PO TABS
40.0000 mg | ORAL_TABLET | Freq: Once | ORAL | Status: AC
  Administered 2011-09-04: 40 mg via ORAL
  Filled 2011-09-04: qty 2

## 2011-09-05 NOTE — ED Provider Notes (Signed)
I saw and evaluated the patient, reviewed the resident's note and I agree with the findings and plan. Patient with multiple complaints. He and he she states her legs hurt. She's chronic history of CHF. Lab work and x-ray reassuring. Patient be discharged home to followup  Kara Vincent. Alvino Chapel, MD 09/05/11 [phone]

## 2011-09-07 ENCOUNTER — Emergency Department (HOSPITAL_COMMUNITY)
Admission: EM | Admit: 2011-09-07 | Discharge: 2011-09-07 | Disposition: A | Discharge status: Home / Self Care | Payer: Medicaid Other | Attending: Emergency Medicine | Admitting: Emergency Medicine

## 2011-09-07 ENCOUNTER — Emergency Department (HOSPITAL_COMMUNITY): Payer: Medicaid Other

## 2011-09-07 ENCOUNTER — Encounter (HOSPITAL_COMMUNITY): Payer: Self-pay | Admitting: Emergency Medicine

## 2011-09-07 DIAGNOSIS — Z87891 Personal history of nicotine dependence: Secondary | ICD-10-CM | POA: Insufficient documentation

## 2011-09-07 DIAGNOSIS — I10 Essential (primary) hypertension: Secondary | ICD-10-CM | POA: Insufficient documentation

## 2011-09-07 DIAGNOSIS — R0602 Shortness of breath: Secondary | ICD-10-CM | POA: Insufficient documentation

## 2011-09-07 DIAGNOSIS — Z5900 Homelessness unspecified: Secondary | ICD-10-CM | POA: Insufficient documentation

## 2011-09-07 DIAGNOSIS — R5383 Other fatigue: Secondary | ICD-10-CM | POA: Insufficient documentation

## 2011-09-07 DIAGNOSIS — J45909 Unspecified asthma, uncomplicated: Secondary | ICD-10-CM

## 2011-09-07 DIAGNOSIS — R059 Cough, unspecified: Secondary | ICD-10-CM | POA: Insufficient documentation

## 2011-09-07 DIAGNOSIS — R51 Headache: Secondary | ICD-10-CM | POA: Insufficient documentation

## 2011-09-07 DIAGNOSIS — R5381 Other malaise: Secondary | ICD-10-CM | POA: Insufficient documentation

## 2011-09-07 DIAGNOSIS — R05 Cough: Secondary | ICD-10-CM | POA: Insufficient documentation

## 2011-09-07 DIAGNOSIS — Z79899 Other long term (current) drug therapy: Secondary | ICD-10-CM | POA: Insufficient documentation

## 2011-09-07 DIAGNOSIS — R229 Localized swelling, mass and lump, unspecified: Secondary | ICD-10-CM | POA: Insufficient documentation

## 2011-09-07 DIAGNOSIS — R509 Fever, unspecified: Secondary | ICD-10-CM | POA: Insufficient documentation

## 2011-09-07 DIAGNOSIS — M7989 Other specified soft tissue disorders: Secondary | ICD-10-CM

## 2011-09-07 DIAGNOSIS — K219 Gastro-esophageal reflux disease without esophagitis: Secondary | ICD-10-CM | POA: Insufficient documentation

## 2011-09-07 HISTORY — DX: Unspecified osteoarthritis, unspecified site: M19.90

## 2011-09-07 MED ORDER — OXYCODONE-ACETAMINOPHEN 5-325 MG PO TABS
2.0000 | ORAL_TABLET | Freq: Once | ORAL | Status: AC
  Administered 2011-09-07: 2 via ORAL
  Filled 2011-09-07: qty 2

## 2011-09-07 MED ORDER — ALBUTEROL SULFATE (5 MG/ML) 0.5% IN NEBU
2.5000 mg | INHALATION_SOLUTION | Freq: Once | RESPIRATORY_TRACT | Status: AC
  Administered 2011-09-07: 2.5 mg via RESPIRATORY_TRACT
  Filled 2011-09-07: qty 0.5

## 2011-09-07 NOTE — Discharge Instructions (Signed)
Please followup with your doctor in 3-5 days for recheck. Continue taking your home medications. For the knot on your right leg, use warm compresses and massage it gently. Expect it to last for about 2 weeks. If it persists, have your primary care doctor reassess it at that time

## 2011-09-07 NOTE — ED Notes (Signed)
Patient here for multiple symptoms; patient reporting right leg pain and swelling that has not resolved since her last visit.  Patient also complaining of an asthma "flare-up"; states that she has been taking her inhaler for this problem -- states that the inhaler has caused a constant headache.  Denies chest pain.  Patient was recently seen at [facility] on the 12th.

## 2011-09-07 NOTE — ED Notes (Signed)
Patient states for the last 3-4 days or longer her right lower leg has been hurting, knot below the knee, and right thigh hurts as well.  Stated the nurses that come to her house told her she needed to either go to the doctor or the hospital

## 2011-09-07 NOTE — ED Notes (Signed)
Patient states she if feeling a little better.  Moves all ext well

## 2011-09-07 NOTE — ED Provider Notes (Signed)
History     CSN: [id number]  Arrival date & time 09/07/11  0309   First MD Initiated Contact with Patient 09/07/11 0353      Chief Complaint  Patient presents with  . Leg Swelling  . Headache    (Consider location/radiation/quality/duration/timing/severity/associated sxs/prior treatment) HPI 46 year old female presents to the emergency department with complaint of right lower leg swelling and pain and shortness of breath with cough. Patient reports swelling has been ongoing for the last 3 or 4 days. Patient complaint of a knot to the anterior portion of her right leg. She denies any trauma to the area. Pain radiates from the knot around her leg and up into right thigh. Patient was seen by her Dr. 4 days ago on Tuesday, reports she got a shot at the office, which did not help her symptoms. Patient also complaining of increasing wheeze, shortness of breath and cough. She has had subjective chills no documented fevers. Patient reports her sputum has changed color. Patient reported headache to triage nurse, but does not complain of headache at this time to me. Patient was seen for similar complaints in the emergency room 4 days ago. Patient has been treated for cellulitis in the past, was admitted June 2 to the fourth for acute renal failure. Patient had DVT study on June 4, which was negative. Patient was seen in the emergency department on the 10th and the 14th. Both times received a Percocet prescriptions. Patient was diagnosed with a contusion on her visit on the 10th, and reassured at her visit on the 14th. Past Medical History  Diagnosis Date  . Asthma   . GERD (gastroesophageal reflux disease)   . Hypertension   . Homelessness   . Low back pain   . 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 Arthritis     Past Surgical History  Procedure Date  . Tubal ligation   . Cesarean section     Family History  Problem Relation Age of Onset  . Asthma Mother   . Diabetes Father    . Heart disease Father   . Stroke Father     History  Substance Use Topics  . Smoking status: Former Smoker    Types: Cigarettes    Quit date: 03/25/2003  . Smokeless tobacco: Former Systems developer    Quit date: 05/27/1978  . Alcohol Use: No    OB History    Grav Para Term Preterm Abortions TAB SAB Ect Mult Living                  Review of Systems  Constitutional: Positive for fever, chills, activity change, appetite change and fatigue.  HENT: Positive for hearing loss, ear pain, congestion, sore throat, rhinorrhea, postnasal drip, sinus pressure and tinnitus.   Eyes: Negative.   Respiratory: Positive for cough, shortness of breath and wheezing.   Cardiovascular: Negative.   Gastrointestinal: Negative.   Genitourinary: Positive for urgency, frequency, decreased urine volume, difficulty urinating and pelvic pain.  Musculoskeletal: Positive for myalgias, joint swelling, arthralgias and gait problem.  Skin: Negative.   Neurological: Negative.   Hematological: Bruises/bleeds easily.  Psychiatric/Behavioral: Negative.     Allergies  Azithromycin; Doxycycline; Ibuprofen; and Sulfamethoxazole w-trimethoprim  Home Medications   Current Outpatient Rx  Name Route Sig Dispense Refill  . ALBUTEROL SULFATE HFA 108 (90 BASE) MCG/ACT IN AERS Inhalation Inhale 1 puff into the lungs every 4 (four) hours as needed. For asthma relief    .  ALBUTEROL SULFATE (2.5 MG/3ML) 0.083% IN NEBU Nebulization Take 3 mLs (2.5 mg total) by nebulization every 6 (six) hours as needed for wheezing. 75 mL 12  . ALLOPURINOL 100 MG PO TABS Oral Take 1 tablet (100 mg total) by mouth daily. 30 tablet 2  . ALPRAZOLAM 1 MG PO TABS Oral Take 1 mg by mouth 2 (two) times daily. Anxiety and depression    . AMMONIUM LACTATE 12 % EX CREA Topical Apply topically 2 (two) times daily. In a thin layer on affected area     . ASPIRIN 325 MG PO TABS Oral Take 325 mg by mouth daily.    . ATORVASTATIN CALCIUM 20 MG PO TABS Oral Take  20 mg by mouth daily.    [name] CLONIDINE HCL 0.2 MG PO TABS Oral Take 0.2 mg by mouth 2 (two) times daily.    [name] DIPHENHYDRAMINE HCL 25 MG PO CAPS Oral Take 25 mg by mouth every 6 (six) hours as needed.    [name] FAMOTIDINE 20 MG PO TABS Oral Take 1 tablet (20 mg total) by mouth 2 (two) times daily. 30 tablet 0  . FUROSEMIDE 40 MG PO TABS Oral Take 40 mg by mouth daily.    [name] HYDROCODONE-ACETAMINOPHEN 10-325 MG PO TABS Oral Take 1 tablet by mouth every 6 (six) hours as needed. For pain    . KETOCONAZOLE 200 MG PO TABS Oral Take 200 mg by mouth daily.    . LUBIPROSTONE 24 MCG PO CAPS Oral Take 24 mcg by mouth 2 (two) times daily with a meal.    . OXYCODONE-ACETAMINOPHEN 5-325 MG PO TABS Oral Take 1-2 tablets by mouth every 6 (six) hours as needed for pain. 15 tablet 0  . PANTOPRAZOLE SODIUM 40 MG PO TBEC Oral Take 40 mg by mouth daily.    [name] SOLIFENACIN SUCCINATE 5 MG PO TABS Oral Take 1 tablet (5 mg total) by mouth daily. 30 tablet 11  . TRAZODONE HCL 50 MG PO TABS Oral Take 2 tablets (100 mg total) by mouth at bedtime. 60 tablet 3  . EUCERIN EX LOTN Topical Apply topically 2 (two) times daily. On affected areas       BP 128/72  Pulse 72  Temp 98 F (36.7 C) (Oral)  Resp 18  SpO2 96%  LMP 03/24/2009  Physical Exam  Nursing note and vitals reviewed. Constitutional: No distress.       Morbidly obese female, asleep in another room. She appears in no acute distress. Patient moves easily on stretcher given her size.  HENT:  Head: Normocephalic and atraumatic.  Eyes: Conjunctivae and EOM are normal. Pupils are equal, round, and reactive to light.  Neck: Normal range of motion. Neck supple. No JVD present. No tracheal deviation present. No thyromegaly present.  Cardiovascular: Normal rate, regular rhythm, normal heart sounds and intact distal pulses.  Exam reveals no gallop and no friction rub.   No murmur heard. Pulmonary/Chest: Effort normal. No stridor. No respiratory distress. She has wheezes. She  has no rales. She exhibits no tenderness.  Abdominal: Bowel sounds are normal. She exhibits no distension and no mass. There is no tenderness. There is no rebound and no guarding.  Musculoskeletal: She exhibits tenderness.       Patient with 2 cm firm nodule on right lateral upper shin freely mobile no fluctuance no overlying edema. Mild tenderness to the area. Does not appear infected. Patient with edema to lower legs 1+ no signs of cellulitis to the legs  Lymphadenopathy:  She has no cervical adenopathy.  Skin: Skin is warm and dry. No rash noted. She is not diaphoretic. No erythema. No pallor.    ED Course  Procedures (including critical care time)  Labs Reviewed - No data to display No results found.   1. Asthma   2. Nodule of soft tissue       MDM  46 year old female with nodule to right leg, do not feel patient has symptoms of DVT at this time. Patient with recent DVT study. Patient given her body habitus is at risk for DVT, but do not feel it is a high likelihood at this time. Unclear etiology of the nodule on her leg, may be secondary to trauma to the area. Advised patient to monitor for the next 4-6 weeks for changes. Patient follow up with primary care Dr. Patient is been given a albuterol treatment here with improvement in her wheezing. No signs of her chest x-ray of infiltrate. Patient informed no prescriptions will be filled at this time as she should have her chronic pain medicines managed by her primary care Dr.        Kalman Drape, MD 09/07/11 [phone]

## 2011-09-14 ENCOUNTER — Encounter (HOSPITAL_COMMUNITY): Payer: Self-pay | Admitting: Emergency Medicine

## 2011-09-14 ENCOUNTER — Emergency Department (HOSPITAL_COMMUNITY)
Admission: EM | Admit: 2011-09-14 | Discharge: 2011-09-14 | Disposition: A | Discharge status: Home / Self Care | Payer: Medicaid Other | Attending: Emergency Medicine | Admitting: Emergency Medicine

## 2011-09-14 DIAGNOSIS — M109 Gout, unspecified: Secondary | ICD-10-CM | POA: Insufficient documentation

## 2011-09-14 DIAGNOSIS — K219 Gastro-esophageal reflux disease without esophagitis: Secondary | ICD-10-CM | POA: Insufficient documentation

## 2011-09-14 DIAGNOSIS — M129 Arthropathy, unspecified: Secondary | ICD-10-CM | POA: Insufficient documentation

## 2011-09-14 DIAGNOSIS — M545 Low back pain, unspecified: Secondary | ICD-10-CM | POA: Insufficient documentation

## 2011-09-14 DIAGNOSIS — IMO0001 Reserved for inherently not codable concepts without codable children: Secondary | ICD-10-CM | POA: Insufficient documentation

## 2011-09-14 DIAGNOSIS — R059 Cough, unspecified: Secondary | ICD-10-CM | POA: Insufficient documentation

## 2011-09-14 DIAGNOSIS — I10 Essential (primary) hypertension: Secondary | ICD-10-CM | POA: Insufficient documentation

## 2011-09-14 DIAGNOSIS — G8929 Other chronic pain: Secondary | ICD-10-CM

## 2011-09-14 DIAGNOSIS — R229 Localized swelling, mass and lump, unspecified: Secondary | ICD-10-CM | POA: Insufficient documentation

## 2011-09-14 DIAGNOSIS — E785 Hyperlipidemia, unspecified: Secondary | ICD-10-CM | POA: Insufficient documentation

## 2011-09-14 DIAGNOSIS — R609 Edema, unspecified: Secondary | ICD-10-CM | POA: Insufficient documentation

## 2011-09-14 DIAGNOSIS — R51 Headache: Secondary | ICD-10-CM | POA: Insufficient documentation

## 2011-09-14 DIAGNOSIS — R0602 Shortness of breath: Secondary | ICD-10-CM | POA: Insufficient documentation

## 2011-09-14 DIAGNOSIS — M2550 Pain in unspecified joint: Secondary | ICD-10-CM | POA: Insufficient documentation

## 2011-09-14 DIAGNOSIS — M79609 Pain in unspecified limb: Secondary | ICD-10-CM | POA: Insufficient documentation

## 2011-09-14 DIAGNOSIS — J45909 Unspecified asthma, uncomplicated: Secondary | ICD-10-CM | POA: Insufficient documentation

## 2011-09-14 DIAGNOSIS — R05 Cough: Secondary | ICD-10-CM | POA: Insufficient documentation

## 2011-09-14 HISTORY — DX: Gout, unspecified: M10.9

## 2011-09-14 HISTORY — DX: Hyperlipidemia, unspecified: E78.5

## 2011-09-14 LAB — CBC
Hemoglobin: 12 g/dL (ref 12.0–15.0)
MCH: 27.6 pg (ref 26.0–34.0)
MCV: 91.5 fL (ref 78.0–100.0)
Platelets: 299 10*3/uL (ref 150–400)
RBC: 4.35 MIL/uL (ref 3.87–5.11)
WBC: 10 10*3/uL (ref 4.0–10.5)

## 2011-09-14 LAB — BASIC METABOLIC PANEL
CO2: 29 mEq/L (ref 19–32)
Chloride: 103 mEq/L (ref 96–112)
Glucose, Bld: 118 mg/dL — ABNORMAL HIGH (ref 70–99)
Sodium: 142 mEq/L (ref 135–145)

## 2011-09-14 MED ORDER — HYDROMORPHONE HCL PF 1 MG/ML IJ SOLN
1.0000 mg | Freq: Once | INTRAMUSCULAR | Status: AC
  Administered 2011-09-14: 1 mg via INTRAMUSCULAR
  Filled 2011-09-14: qty 1

## 2011-09-14 MED ORDER — ACETAMINOPHEN 325 MG PO TABS
975.0000 mg | ORAL_TABLET | Freq: Once | ORAL | Status: AC
  Administered 2011-09-14: 975 mg via ORAL
  Filled 2011-09-14: qty 3

## 2011-09-14 MED ORDER — OXYCODONE-ACETAMINOPHEN 5-325 MG PO TABS: 2.0000 | ORAL_TABLET | ORAL | Status: AC | PRN

## 2011-09-14 NOTE — ED Provider Notes (Signed)
History     CSN: [id number]  Arrival date & time 09/14/11  0434   First MD Initiated Contact with Patient 09/14/11 0448      Chief Complaint  Patient presents with  . Leg Pain     right above and below knee  . Headache  . Arm Pain    right arm above elbow to shoulder    (Consider location/radiation/quality/duration/timing/severity/associated sxs/prior treatment) HPI This patient presents with multiple complaints.  She notes that she has pain, persistently in her lower extremities, and pain in her right knee more than the left.  She gives an unclear time of onset, though it seems as though the pain has been there for weeks or months.  The pain seems to be focally worst about the right inferior knee.  Chest pain is worse with attempts to bear weight, or move.  When asked about other issues, she complains of right upper arm pain, left arm pain, diffuse discomfort, left foot pain.  She denies any ongoing fevers, chills.  She states that the right leg near the area of focal pain was previously much more erythematous, more tender, but actually seems to be improving.  However, she voices concern that the improvement has not happened entirely.  The patient has been seen here for similar complaints 3 times in the past 2 weeks.  I have also taking care of this patient in the past and performing an incision and drainage of the right upper extremity abscess. Past Medical History  Diagnosis Date  . Asthma   . GERD (gastroesophageal reflux disease)   . Hypertension   . Homelessness   . Low back pain   . 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 Arthritis   . Gout   . Hyperlipemia     Past Surgical History  Procedure Date  . Tubal ligation   . Cesarean section     Family History  Problem Relation Age of Onset  . Asthma Mother   . Diabetes Father   . Heart disease Father   . Stroke Father     History  Substance Use Topics  . Smoking status: Former Smoker    Types:  Cigarettes    Quit date: 03/25/2003  . Smokeless tobacco: Former Systems developer    Quit date: 05/27/1978  . Alcohol Use: No    OB History    Grav Para Term Preterm Abortions TAB SAB Ect Mult Living                  Review of Systems  Constitutional: Positive for fever, chills, activity change, appetite change and fatigue.  HENT: Positive for hearing loss, ear pain, congestion, sore throat, rhinorrhea, postnasal drip, sinus pressure and tinnitus.   Eyes: Negative.   Respiratory: Positive for cough, shortness of breath and wheezing.   Cardiovascular: Negative.   Gastrointestinal: Negative.   Genitourinary: Positive for urgency, frequency, decreased urine volume, difficulty urinating and pelvic pain.  Musculoskeletal: Positive for myalgias, joint swelling, arthralgias and gait problem.  Skin: Negative.   Neurological: Negative.   Hematological: Bruises/bleeds easily.  Psychiatric/Behavioral: Negative.     Allergies  Azithromycin; Doxycycline; Ibuprofen; and Sulfamethoxazole w-trimethoprim  Home Medications   Current Outpatient Rx  Name Route Sig Dispense Refill  . ALBUTEROL SULFATE HFA 108 (90 BASE) MCG/ACT IN AERS Inhalation Inhale 1 puff into the lungs every 4 (four) hours as needed. For asthma relief    . ALBUTEROL SULFATE (2.5 MG/3ML) 0.083% IN  NEBU Nebulization Take 3 mLs (2.5 mg total) by nebulization every 6 (six) hours as needed for wheezing. 75 mL 12  . ALLOPURINOL 100 MG PO TABS Oral Take 1 tablet (100 mg total) by mouth daily. 30 tablet 2  . ALPRAZOLAM 1 MG PO TABS Oral Take 1 mg by mouth 2 (two) times daily. Anxiety and depression    . AMMONIUM LACTATE 12 % EX CREA Topical Apply topically 2 (two) times daily. In a thin layer on affected area     . ASPIRIN 325 MG PO TABS Oral Take 325 mg by mouth daily.    . ATORVASTATIN CALCIUM 20 MG PO TABS Oral Take 20 mg by mouth daily.    [name] CLONIDINE HCL 0.2 MG PO TABS Oral Take 0.2 mg by mouth 2 (two) times daily.    [name] DIPHENHYDRAMINE  HCL 25 MG PO CAPS Oral Take 25 mg by mouth every 6 (six) hours as needed.    Ree Kida EX LOTN Topical Apply topically 2 (two) times daily. On affected areas     . FAMOTIDINE 20 MG PO TABS Oral Take 1 tablet (20 mg total) by mouth 2 (two) times daily. 30 tablet 0  . FUROSEMIDE 40 MG PO TABS Oral Take 40 mg by mouth daily.    [name] HYDROCODONE-ACETAMINOPHEN 10-325 MG PO TABS Oral Take 1 tablet by mouth every 6 (six) hours as needed. For pain    . KETOCONAZOLE 200 MG PO TABS Oral Take 200 mg by mouth daily.    . LUBIPROSTONE 24 MCG PO CAPS Oral Take 24 mcg by mouth every evening.     [name] PANTOPRAZOLE SODIUM 40 MG PO TBEC Oral Take 40 mg by mouth daily.    [name] SOLIFENACIN SUCCINATE 5 MG PO TABS Oral Take 1 tablet (5 mg total) by mouth daily. 30 tablet 11  . TRAZODONE HCL 50 MG PO TABS Oral Take 2 tablets (100 mg total) by mouth at bedtime. 60 tablet 3    BP 171/109  Pulse 68  Temp 97.7 F (36.5 C) (Oral)  Resp 21  SpO2 96%  LMP 03/24/2009  Physical Exam  Nursing note and vitals reviewed. Constitutional: No distress.       Morbidly obese female, asleep in another room. She appears in no acute distress. Patient moves easily on stretcher given her size.  HENT:  Head: Normocephalic and atraumatic.  Eyes: Conjunctivae and EOM are normal. Pupils are equal, round, and reactive to light.  Neck: Normal range of motion. Neck supple. No JVD present. No tracheal deviation present. No thyromegaly present.  Cardiovascular: Normal rate, regular rhythm, normal heart sounds and intact distal pulses.  Exam reveals no gallop and no friction rub.   No murmur heard. Pulmonary/Chest: Effort normal. No stridor. No respiratory distress. She has wheezes. She has no rales. She exhibits no tenderness.  Abdominal: Bowel sounds are normal. She exhibits no distension and no mass. There is no tenderness. There is no rebound and no guarding.  Musculoskeletal: She exhibits tenderness.       Patient with 2 cm firm nodule on  right lateral upper shin freely mobile no fluctuance no overlying edema. Mild tenderness to the area. Does not appear infected. Patient with edema to lower legs 1+ no signs of cellulitis to the legs  Lymphadenopathy:    She has no cervical adenopathy.  Skin: Skin is warm and dry. No rash noted. She is not diaphoretic. No erythema. No pallor.    ED Course  Procedures (including  critical care time)   Labs Reviewed  CBC  BASIC METABOLIC PANEL   No results found.   No diagnosis found.    MDM  This morbidly obese female, who is largely motorized wheelchair bound now presents with continued multiple complaints.  On my exam the patient is in no distress.  She relates a migratory stream of chronic pain issues, and notes that she actually feels slightly better in her most concerning area, her right lower extremity.  Although the patient is obese, there is low suspicion for DVT in this area, given the absence of cough skin, worsening superficial color changes, notable pain with palpation.  The patient also is not hypoxic, tachypneic or tachycardic.  The patient was discharged with instructions to follow up with her primary care physician for continued evaluation and management of her multiple complaints.   Carmin Muskrat, MD 09/14/11 [phone]

## 2011-09-14 NOTE — ED Notes (Signed)
Pt reports arm arm pain above elbow that radiated to shoulder and right leg pain above and below knee with knot below knee onset x1 week. Pt uses power wheel chair and / or cane to ambulate

## 2011-09-14 NOTE — Discharge Instructions (Signed)
With your medical issues, it is extremely important that you speak with your physician tomorrow to insure appropriate ongoing care.  Your evaluation today did not demonstrate acute, life or limb threatening findings, but in order to address all of your concerns you have to speak with your physician as soon as possible.  If you develop any new, or concerning changes in your condition, please return to the emergency department immediately.

## 2011-11-14 IMAGING — CR DG CHEST 2V
2 series · 2 of 2 positions shown · non-contrast
Comparison: 10/06/2008 and earlier.

CLINICAL DATA: 44-year-old female with left chest pain, shortness
of breath, rash.

CHEST - 2 VIEW

[w chest pa *]
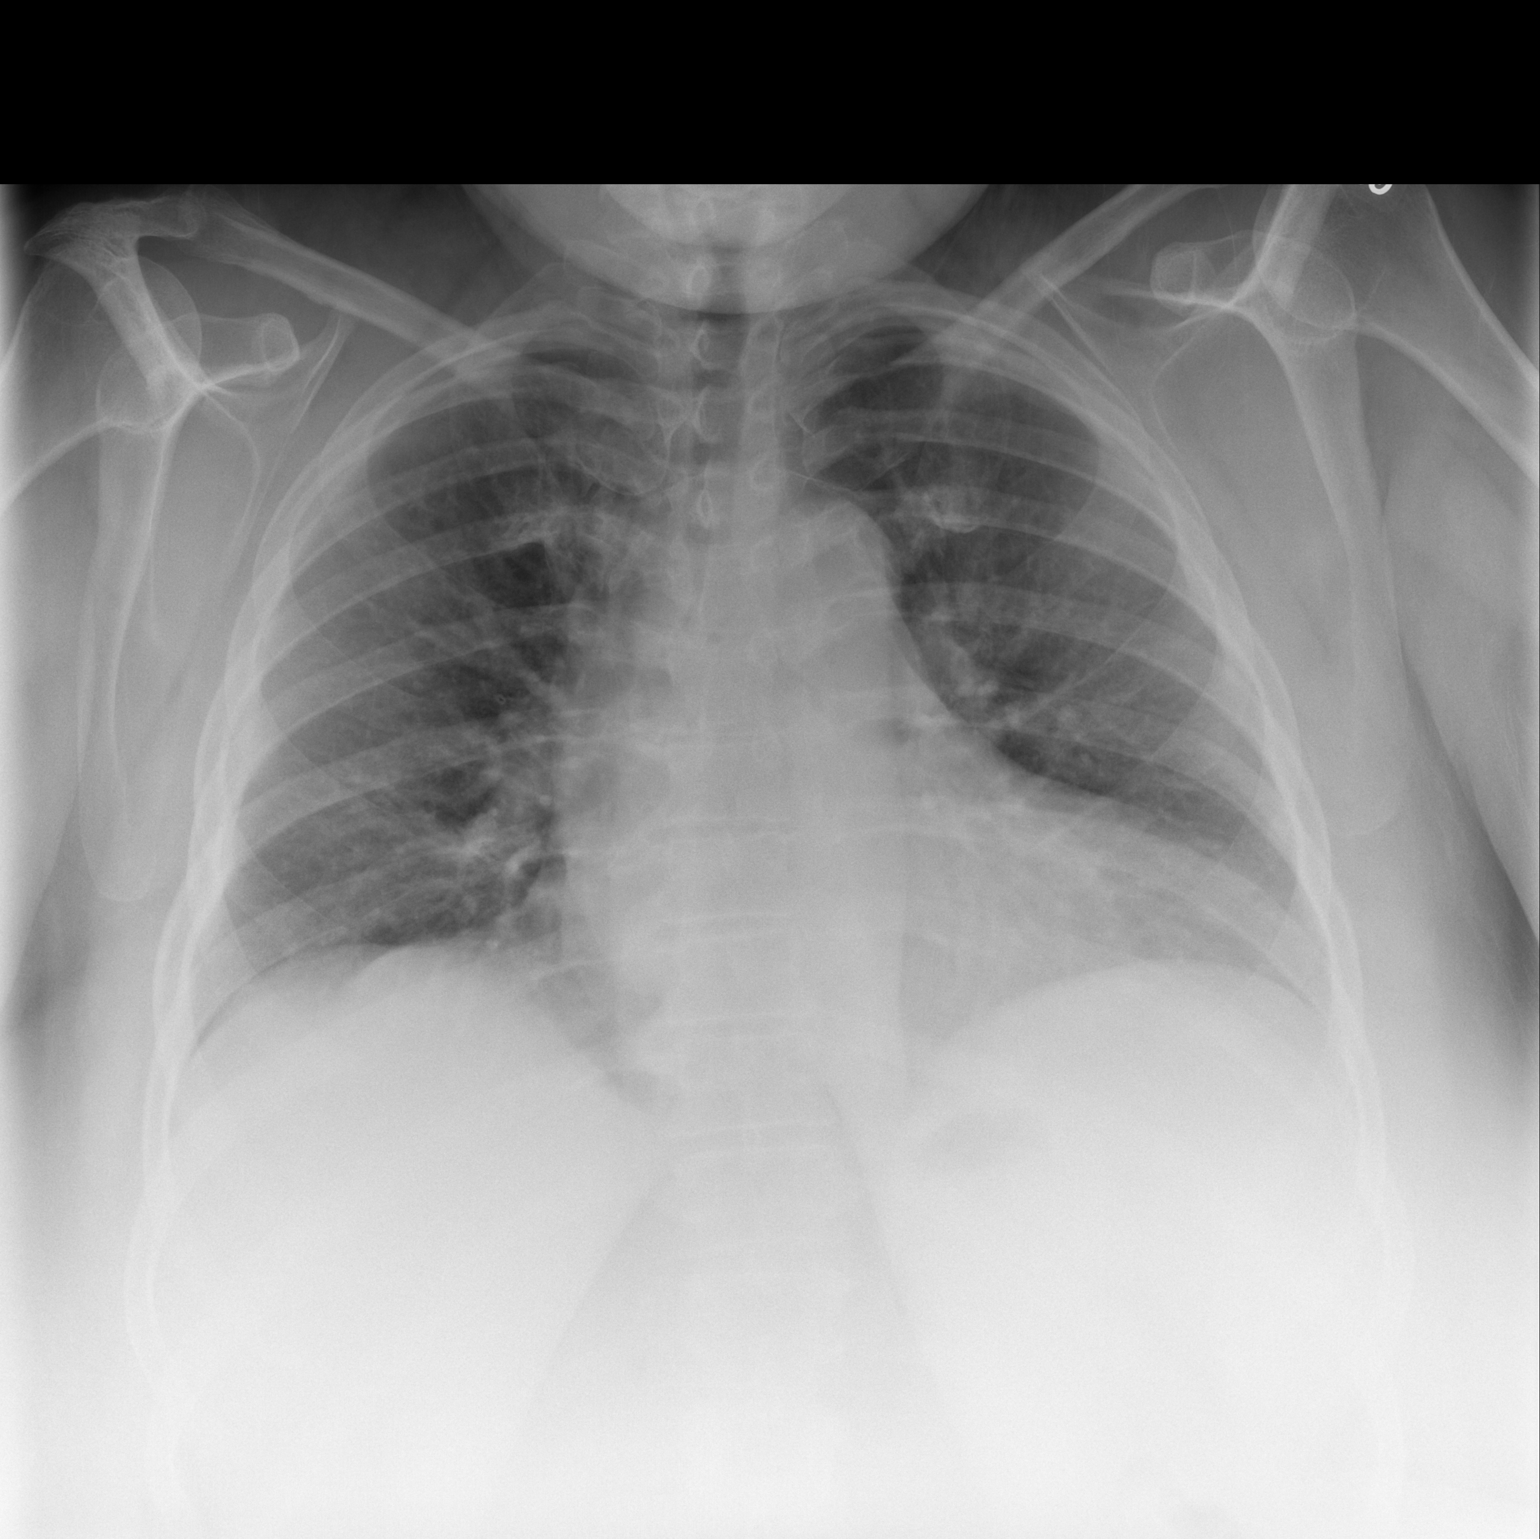

[w chest lat *]
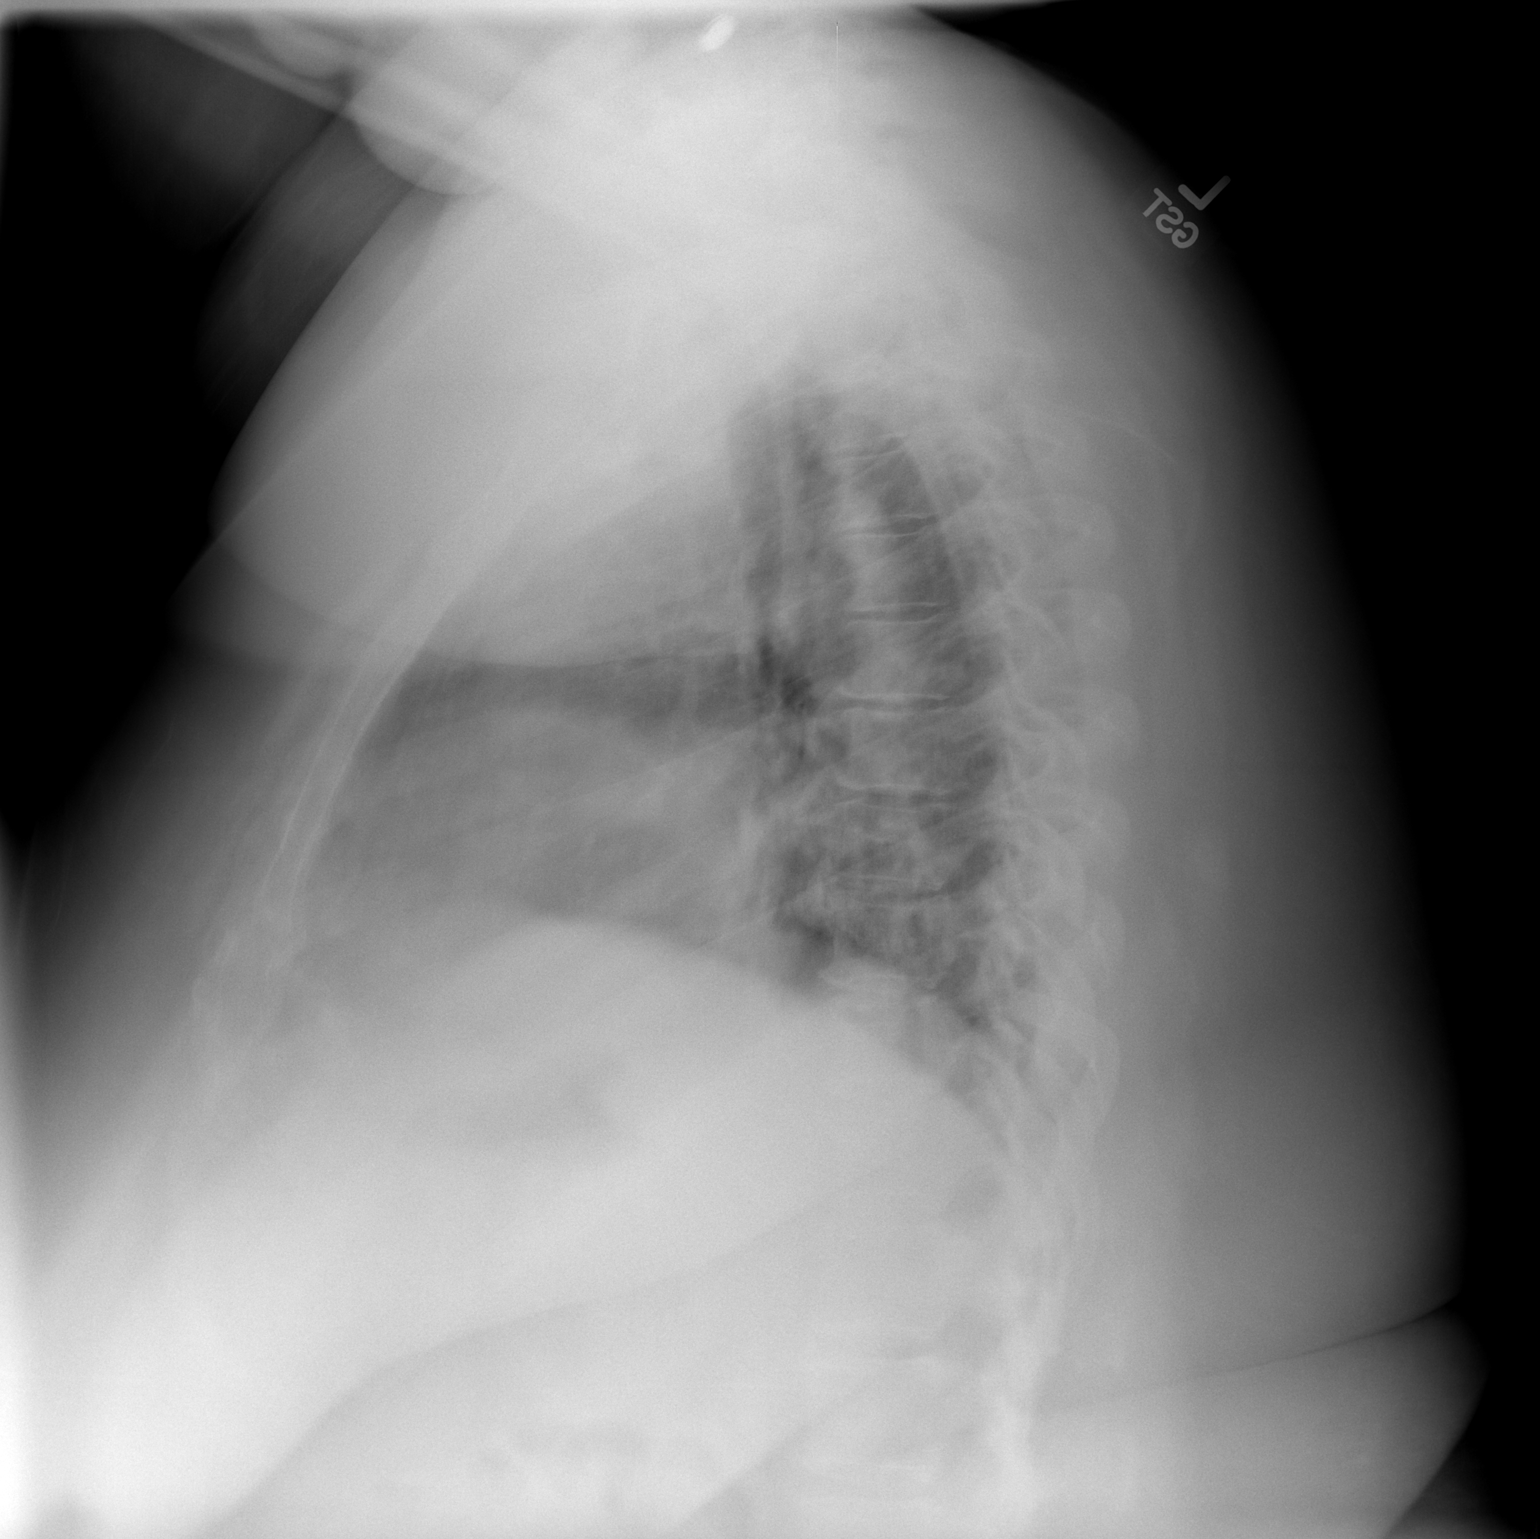

[2 of 2 positions shown; findings below may reference images not displayed]

FINDINGS: Large body habitus. Mild cardiomegaly.  Other mediastinal
contours are within normal limits. Visualized tracheal air column
is within normal limits.  Chronic interstitial prominence is stable
over multiple prior exams.  Mild respiratory motion artifact on the
lateral view.  No pneumothorax, pleural effusion, confluent
airspace opacity or overt pulmonary edema. No acute osseous
abnormality identified.
IMPRESSION: Stable cardiomegaly.  No acute cardiopulmonary abnormality.

## 2011-11-27 ENCOUNTER — Emergency Department (HOSPITAL_COMMUNITY)
Admission: EM | Admit: 2011-11-27 | Discharge: 2011-11-28 | Disposition: A | Discharge status: Home / Self Care | Payer: Medicaid Other | Attending: Emergency Medicine | Admitting: Emergency Medicine

## 2011-11-27 ENCOUNTER — Encounter (HOSPITAL_COMMUNITY): Payer: Self-pay | Admitting: *Deleted

## 2011-11-27 DIAGNOSIS — E785 Hyperlipidemia, unspecified: Secondary | ICD-10-CM | POA: Insufficient documentation

## 2011-11-27 DIAGNOSIS — Z79899 Other long term (current) drug therapy: Secondary | ICD-10-CM | POA: Insufficient documentation

## 2011-11-27 DIAGNOSIS — I10 Essential (primary) hypertension: Secondary | ICD-10-CM | POA: Insufficient documentation

## 2011-11-27 DIAGNOSIS — Z87891 Personal history of nicotine dependence: Secondary | ICD-10-CM | POA: Insufficient documentation

## 2011-11-27 DIAGNOSIS — R7989 Other specified abnormal findings of blood chemistry: Secondary | ICD-10-CM

## 2011-11-27 DIAGNOSIS — Z8739 Personal history of other diseases of the musculoskeletal system and connective tissue: Secondary | ICD-10-CM | POA: Insufficient documentation

## 2011-11-27 DIAGNOSIS — R109 Unspecified abdominal pain: Secondary | ICD-10-CM | POA: Insufficient documentation

## 2011-11-27 DIAGNOSIS — K219 Gastro-esophageal reflux disease without esophagitis: Secondary | ICD-10-CM | POA: Insufficient documentation

## 2011-11-27 DIAGNOSIS — R791 Abnormal coagulation profile: Secondary | ICD-10-CM | POA: Insufficient documentation

## 2011-11-27 DIAGNOSIS — J45909 Unspecified asthma, uncomplicated: Secondary | ICD-10-CM | POA: Insufficient documentation

## 2011-11-27 DIAGNOSIS — Z7982 Long term (current) use of aspirin: Secondary | ICD-10-CM | POA: Insufficient documentation

## 2011-11-27 NOTE — ED Notes (Signed)
Pt c/o constipation; states had to strain to have a bowel movement then had a small amt of blood afterwards

## 2011-11-27 NOTE — ED Notes (Signed)
Pt reports right arm pain from when [facility] gave her pain medication 3-4 weeks. Pt states it hurts when she "lays, raises, and picks up a milk jug" on her arm. Dr. Eulis Foster told her to get an x-ray done on her arm, but pt reports she has not had an x-ray done. Pt states she takes stool softeners and pain medication. Pt last BM was today, reports it being small brown stools with streaks of blood on it. Pt states "sometimes I have such a hard time that I have to get it out with my own hands (two fingers)". Pt is morbidly obese on assessment. Pt reports headache that started 2-3 days prior.

## 2011-11-27 NOTE — ED Notes (Signed)
Pt states her legs and abdomen hurts when she lays on them. She states it feels numb and feels her legs tingling.

## 2011-11-28 ENCOUNTER — Emergency Department (HOSPITAL_COMMUNITY): Payer: Medicaid Other

## 2011-11-28 ENCOUNTER — Encounter (HOSPITAL_COMMUNITY): Payer: Self-pay | Admitting: Radiology

## 2011-11-28 DIAGNOSIS — M79609 Pain in unspecified limb: Secondary | ICD-10-CM

## 2011-11-28 DIAGNOSIS — M7989 Other specified soft tissue disorders: Secondary | ICD-10-CM

## 2011-11-28 LAB — URINALYSIS, MICROSCOPIC ONLY
Hgb urine dipstick: NEGATIVE
Nitrite: NEGATIVE
Protein, ur: NEGATIVE mg/dL
Urobilinogen, UA: 1 mg/dL (ref 0.0–1.0)

## 2011-11-28 LAB — COMPREHENSIVE METABOLIC PANEL
ALT: 10 U/L (ref 0–35)
BUN: 34 mg/dL — ABNORMAL HIGH (ref 6–23)
CO2: 33 mEq/L — ABNORMAL HIGH (ref 19–32)
Calcium: 8.8 mg/dL (ref 8.4–10.5)
Creatinine, Ser: 1.61 mg/dL — ABNORMAL HIGH (ref 0.50–1.10)
GFR calc Af Amer: 43 mL/min — ABNORMAL LOW (ref >90)
GFR calc non Af Amer: 37 mL/min — ABNORMAL LOW (ref >90)
Glucose, Bld: 101 mg/dL — ABNORMAL HIGH (ref 70–99)

## 2011-11-28 LAB — CBC WITH DIFFERENTIAL/PLATELET
Eosinophils Absolute: 0.3 10*3/uL (ref 0.0–0.7)
Eosinophils Relative: 2 % (ref 0–5)
HCT: 42.1 % (ref 36.0–46.0)
Lymphocytes Relative: 22 % (ref 12–46)
Lymphs Abs: 2.4 10*3/uL (ref 0.7–4.0)
MCH: 28.3 pg (ref 26.0–34.0)
MCV: 90.3 fL (ref 78.0–100.0)
Monocytes Absolute: 0.6 10*3/uL (ref 0.1–1.0)
Monocytes Relative: 5 % (ref 3–12)
RBC: 4.66 MIL/uL (ref 3.87–5.11)
WBC: 10.5 10*3/uL (ref 4.0–10.5)

## 2011-11-28 LAB — OCCULT BLOOD, POC DEVICE: Fecal Occult Bld: POSITIVE

## 2011-11-28 MED ORDER — SODIUM CHLORIDE 0.9 % IV BOLUS (SEPSIS)
1000.0000 mL | Freq: Once | INTRAVENOUS | Status: AC
  Administered 2011-11-28: 1000 mL via INTRAVENOUS

## 2011-11-28 MED ORDER — SODIUM CHLORIDE 0.9 % IV SOLN
1000.0000 mL | INTRAVENOUS | Status: DC
  Administered 2011-11-28: 1000 mL via INTRAVENOUS

## 2011-11-28 MED ORDER — POLYETHYLENE GLYCOL 3350 17 GM/SCOOP PO POWD: 17.0000 g | Freq: Two times a day (BID) | ORAL | Status: AC

## 2011-11-28 MED ORDER — OXYCODONE-ACETAMINOPHEN 5-325 MG PO TABS
1.0000 | ORAL_TABLET | Freq: Once | ORAL | Status: AC
  Administered 2011-11-28: 1 via ORAL
  Filled 2011-11-28: qty 1

## 2011-11-28 MED ORDER — HYDROMORPHONE HCL PF 1 MG/ML IJ SOLN
1.0000 mg | Freq: Once | INTRAMUSCULAR | Status: AC
  Administered 2011-11-28: 1 mg via INTRAMUSCULAR
  Filled 2011-11-28: qty 1

## 2011-11-28 MED ORDER — SODIUM CHLORIDE 0.9 % IV SOLN
1000.0000 mL | Freq: Once | INTRAVENOUS | Status: AC
  Administered 2011-11-28: 1000 mL via INTRAVENOUS

## 2011-11-28 MED ORDER — MORPHINE SULFATE 4 MG/ML IJ SOLN
4.0000 mg | Freq: Once | INTRAMUSCULAR | Status: AC
  Administered 2011-11-28: 4 mg via INTRAVENOUS
  Filled 2011-11-28: qty 1

## 2011-11-28 MED ORDER — METOCLOPRAMIDE HCL 5 MG/ML IJ SOLN
10.0000 mg | Freq: Once | INTRAMUSCULAR | Status: AC
  Administered 2011-11-28: 10 mg via INTRAVENOUS
  Filled 2011-11-28: qty 2

## 2011-11-28 MED ORDER — HYDROCODONE-ACETAMINOPHEN 5-325 MG PO TABS: 2.0000 | ORAL_TABLET | ORAL | Status: AC | PRN

## 2011-11-28 NOTE — ED Provider Notes (Signed)
12:11 PM Patient had a transient drop in her blood pressure down into the mid [id number] systolic patient in the ER for nearly 13 hours and had an EEG or drink anything.  She received a 1 L fluid bolus and was sitting up in the ED in.  Her repeat blood pressure after this was [id number] systolic.  Vascular ultrasound of her right lower extremity does is no evidence of DVT.  CT abdomen pelvis normal.  Discharge home with close PCP followup  Hoy Morn, MD 11/28/11 1211

## 2011-11-28 NOTE — ED Notes (Signed)
Pt requesting more pain medication.

## 2011-11-28 NOTE — ED Provider Notes (Signed)
Medical screening examination/treatment/procedure(s) were performed by non-physician practitioner and as supervising physician I was immediately available for consultation/collaboration.  Carlisle Beers, MD 11/28/11 [phone]

## 2011-11-28 NOTE — ED Notes (Signed)
Pt requesting pain medication.  

## 2011-11-28 NOTE — ED Notes (Signed)
Pt sitting up in bed eating without difficulty pt's bp remains in the 80's on the monitor pt remains alert and oriented

## 2011-11-28 NOTE — ED Notes (Signed)
Ultrasound tech at bedside pt bp reading [place] at bedside notified and aware. Pt with iv fluids up and infusing per md campos

## 2011-11-28 NOTE — ED Provider Notes (Signed)
8:58 AM Upon discharge, patient complains of left calf pain and posterior knee pain. She reports subjective swelling which is unable to assess due to the patient's body habitus. I will order a venous duplex of LLE to rule out DVT. If the ultrasound is negative, patient can be discharged as initially planned.   Patient venous doppler is negative and patient can be discharged. No further evaluation needed at this time.   Alvina Chou, PA-C 12/05/11 2203

## 2011-11-28 NOTE — ED Notes (Addendum)
Notified RN, Ivey decrease in oxygen level. Pt. Oxygen 88%, Per RN. Ivey, patient placed on 2 liters oxygen.

## 2011-11-28 NOTE — ED Provider Notes (Signed)
History     CSN: [id number]  Arrival date & time 11/27/11  2138   First MD Initiated Contact with Patient 11/28/11 0145      Chief Complaint  Patient presents with  . Constipation    (Consider location/radiation/quality/duration/timing/severity/associated sxs/prior treatment) HPI History provided by pt.   Pt presents w/ multiple complaints.  She has had pain in her right upper arm for 2-3 weeks, ever since receiving a dilaudid injection.  Denies trauma.  No h/o cancer.  Denies fever.   Her PCP told her that she will need an xray if pain persists.  Also c/o pain in anterior right knee.  Has been diagnosed w/ arthritis but this pain feels worse.  Aggravated by flexion and bearing weight. Denies trauma.  Also c/o constipation.  Has been straining to have a bowel movement for the past several days.  Noticed a small amount of bright red blood in her stool today.   Has mild, crampy abd pain that usually improves after having a BM.  Past abd surgeries include 2 c-sections.  Also c/o pain on top and at posterior head that feels similar to her migraines.    Past Medical History  Diagnosis Date  . Asthma   . GERD (gastroesophageal reflux disease)   . Hypertension   . Homelessness   . Low back pain   . 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 Arthritis   . Gout   . Hyperlipemia     Past Surgical History  Procedure Date  . Tubal ligation   . Cesarean section     Family History  Problem Relation Age of Onset  . Asthma Mother   . Diabetes Father   . Heart disease Father   . Stroke Father     History  Substance Use Topics  . Smoking status: Former Smoker    Types: Cigarettes    Quit date: 03/25/2003  . Smokeless tobacco: Former Systems developer    Quit date: 05/27/1978  . Alcohol Use: No    OB History    Grav Para Term Preterm Abortions TAB SAB Ect Mult Living                  Review of Systems  All other systems reviewed and are negative.    Allergies    Azithromycin; Doxycycline; Ibuprofen; and Sulfamethoxazole w-trimethoprim  Home Medications   Current Outpatient Rx  Name Route Sig Dispense Refill  . ALBUTEROL SULFATE HFA 108 (90 BASE) MCG/ACT IN AERS Inhalation Inhale 1 puff into the lungs every 4 (four) hours as needed. For asthma relief    . ALBUTEROL SULFATE (2.5 MG/3ML) 0.083% IN NEBU Nebulization Take 3 mLs (2.5 mg total) by nebulization every 6 (six) hours as needed for wheezing. 75 mL 12  . ALPRAZOLAM 1 MG PO TABS Oral Take 1 mg by mouth 2 (two) times daily. Anxiety and depression    . AMITRIPTYLINE HCL 75 MG PO TABS Oral Take 75 mg by mouth at bedtime.    . AMMONIUM LACTATE 12 % EX CREA Topical Apply topically 2 (two) times daily. In a thin layer on affected area     . ASPIRIN 325 MG PO TABS Oral Take 325 mg by mouth daily.    . ATORVASTATIN CALCIUM 20 MG PO TABS Oral Take 20 mg by mouth daily.    [name] CLONIDINE HCL 0.2 MG PO TABS Oral Take 0.2 mg by mouth 2 (two) times daily.    [name]  DIPHENHYDRAMINE HCL 25 MG PO CAPS Oral Take 25 mg by mouth every 6 (six) hours as needed.    Ree Kida EX LOTN Topical Apply topically 2 (two) times daily. On affected areas     . FAMOTIDINE 20 MG PO TABS Oral Take 1 tablet (20 mg total) by mouth 2 (two) times daily. 30 tablet 0  . FUROSEMIDE 40 MG PO TABS Oral Take 40 mg by mouth daily.    [name] HYDROCODONE-ACETAMINOPHEN 10-325 MG PO TABS Oral Take 1 tablet by mouth every 6 (six) hours as needed. For pain    . KETOCONAZOLE 200 MG PO TABS Oral Take 200 mg by mouth daily.    . LUBIPROSTONE 24 MCG PO CAPS Oral Take 24 mcg by mouth every evening.     [name] PANTOPRAZOLE SODIUM 40 MG PO TBEC Oral Take 40 mg by mouth daily.      BP 111/70  Pulse 62  Temp 98.6 F (37 C)  Resp 20  SpO2 92%  LMP 03/24/2009  Physical Exam  Nursing note and vitals reviewed. Constitutional: She is oriented to person, place, and time. She appears well-developed and well-nourished. No distress.  HENT:  Head: Normocephalic and  atraumatic.  Eyes:       Normal appearance  Neck: Normal range of motion.       No meningeal signs  Cardiovascular: Normal rate and regular rhythm.   Pulmonary/Chest: Effort normal and breath sounds normal. No respiratory distress.  Abdominal: Soft. Bowel sounds are normal. She exhibits no distension and no mass. There is no rebound and no guarding.       Pt reports tenderness on left side and mid-line lower abdomen but does not appear uncomfortable w/ palpation.    Genitourinary:       No CVA tenderness.  No external hemorrhoids.  Normal rectal tone.  Normal stool color w/out gross blood.   Musculoskeletal: Normal range of motion.       Diffuse right upper arm, shoulder and trap tenderness.  Pt reports pain w/ passive ROM of shoulder but does not appear uncomfortable.   Right knee w/out obvious edema compared to L.  No erythema or warmth.  Diffuse anterior tenderness as well as pain w/ passive ROM reported.  NV all four extremities intact.   Neurological: She is alert and oriented to person, place, and time.       CN 3-12 intact.  No sensory deficits.  5/5 and equal upper and lower extremity strength.  No past pointing.   Skin: Skin is warm and dry. No rash noted.  Psychiatric: She has a normal mood and affect. Her behavior is normal.    ED Course  Procedures (including critical care time)  Labs Reviewed  COMPREHENSIVE METABOLIC PANEL - Abnormal; Notable for the following:    CO2 33 (*)     Glucose, Bld 101 (*)     BUN 34 (*)     Creatinine, Ser 1.61 (*)     GFR calc non Af Amer 37 (*)     GFR calc Af Amer 43 (*)     All other components within normal limits  URINALYSIS, WITH MICROSCOPIC - Abnormal; Notable for the following:    APPearance CLOUDY (*)     Leukocytes, UA SMALL (*)     Squamous Epithelial / LPF MANY (*)     Crystals CA OXALATE CRYSTALS (*)     All other components within normal limits  CBC WITH DIFFERENTIAL  OCCULT BLOOD, POC DEVICE  Dg Humerus  Right  11/28/2011  *RADIOLOGY REPORT*  Clinical Data: Right arm pain for 3 weeks  RIGHT HUMERUS - 2+ VIEW  Comparison: None  Findings: Bones appear demineralized. AC joint alignment normal. Glenohumeral joint alignments normal. No acute fracture, dislocation or bone destruction. No definite soft tissue gas identified.  IMPRESSION: No acute abnormalities.   Original Report Authenticated By: Burnetta Sabin, M.D.      No diagnosis found.    MDM  46yo F presents w/ multiple complaints including non-traumatic R upper arm pain (no signs of infection, xray neg and NV intact; likely muscle strain), acute on chronic R knee pain (no signs of infection/trauma/DVT; likely arthritis exacerbation), headache (no fever, focal neuro deficits or meningeal signs and feels like past migraines), constipation and abdominal pain.  Right-sided abdominal tenderness reported on exam and refractory to IV morphine.   No impaction.  Hemoccult pos.  CT abd/pelvis (w/out contrast d/t mildly elevated creatinine) ordered because patient a poor historian and exam limited by body habitus).  Pending.  Szekalski, PA-C to dispo.  Pt will be referred to ortho for musculoskeletal pain and GI for abd pain and rectal bleeding.  She will be d/c'd home w/ miralax.        Remer Macho, [place] 11/28/11 [phone]

## 2011-11-28 NOTE — ED Notes (Signed)
Pt transported to CT ?

## 2011-11-28 NOTE — Progress Notes (Signed)
VASCULAR LAB PRELIMINARY  PRELIMINARY  PRELIMINARY  PRELIMINARY  Right lower extremity venous duplex completed.    Preliminary report:  Right:  No evidence of DVT, superficial thrombosis, or Baker's cyst.  Velinda Wrobel, RVS 11/28/2011, 10:07 AM

## 2011-12-05 NOTE — ED Provider Notes (Signed)
Medical screening examination/treatment/procedure(s) were conducted as a shared visit with non-physician practitioner(s) and myself.  I personally evaluated the patient during the encounter   Please see my other note  Hoy Morn, MD 12/05/11 2302

## 2011-12-26 ENCOUNTER — Encounter (HOSPITAL_COMMUNITY): Payer: Self-pay | Admitting: *Deleted

## 2011-12-26 ENCOUNTER — Inpatient Hospital Stay (HOSPITAL_COMMUNITY)
Admission: EM | Admit: 2011-12-26 | Discharge: 2011-12-28 | DRG: 683 | Disposition: A | Discharge status: Home / Self Care | Payer: Medicaid Other | Attending: Internal Medicine | Admitting: Internal Medicine

## 2011-12-26 DIAGNOSIS — J45909 Unspecified asthma, uncomplicated: Secondary | ICD-10-CM | POA: Diagnosis present

## 2011-12-26 DIAGNOSIS — I10 Essential (primary) hypertension: Secondary | ICD-10-CM | POA: Diagnosis present

## 2011-12-26 DIAGNOSIS — R197 Diarrhea, unspecified: Secondary | ICD-10-CM | POA: Diagnosis present

## 2011-12-26 DIAGNOSIS — R51 Headache: Secondary | ICD-10-CM | POA: Diagnosis present

## 2011-12-26 DIAGNOSIS — Q828 Other specified congenital malformations of skin: Secondary | ICD-10-CM

## 2011-12-26 DIAGNOSIS — G8929 Other chronic pain: Secondary | ICD-10-CM

## 2011-12-26 DIAGNOSIS — M109 Gout, unspecified: Secondary | ICD-10-CM | POA: Diagnosis present

## 2011-12-26 DIAGNOSIS — J189 Pneumonia, unspecified organism: Secondary | ICD-10-CM

## 2011-12-26 DIAGNOSIS — E669 Obesity, unspecified: Secondary | ICD-10-CM | POA: Diagnosis present

## 2011-12-26 DIAGNOSIS — Z6841 Body Mass Index (BMI) 40.0 and over, adult: Secondary | ICD-10-CM

## 2011-12-26 DIAGNOSIS — G4700 Insomnia, unspecified: Secondary | ICD-10-CM

## 2011-12-26 DIAGNOSIS — R112 Nausea with vomiting, unspecified: Secondary | ICD-10-CM

## 2011-12-26 DIAGNOSIS — E785 Hyperlipidemia, unspecified: Secondary | ICD-10-CM | POA: Diagnosis present

## 2011-12-26 DIAGNOSIS — M129 Arthropathy, unspecified: Secondary | ICD-10-CM | POA: Diagnosis present

## 2011-12-26 DIAGNOSIS — G4733 Obstructive sleep apnea (adult) (pediatric): Secondary | ICD-10-CM

## 2011-12-26 DIAGNOSIS — D72829 Elevated white blood cell count, unspecified: Secondary | ICD-10-CM | POA: Diagnosis present

## 2011-12-26 DIAGNOSIS — Z79899 Other long term (current) drug therapy: Secondary | ICD-10-CM

## 2011-12-26 DIAGNOSIS — K219 Gastro-esophageal reflux disease without esophagitis: Secondary | ICD-10-CM | POA: Diagnosis present

## 2011-12-26 DIAGNOSIS — E860 Dehydration: Secondary | ICD-10-CM | POA: Diagnosis present

## 2011-12-26 DIAGNOSIS — N3941 Urge incontinence: Secondary | ICD-10-CM

## 2011-12-26 DIAGNOSIS — M1710 Unilateral primary osteoarthritis, unspecified knee: Secondary | ICD-10-CM

## 2011-12-26 DIAGNOSIS — R1110 Vomiting, unspecified: Secondary | ICD-10-CM

## 2011-12-26 DIAGNOSIS — I959 Hypotension, unspecified: Secondary | ICD-10-CM

## 2011-12-26 DIAGNOSIS — M549 Dorsalgia, unspecified: Secondary | ICD-10-CM

## 2011-12-26 DIAGNOSIS — L0293 Carbuncle, unspecified: Secondary | ICD-10-CM

## 2011-12-26 DIAGNOSIS — N19 Unspecified kidney failure: Secondary | ICD-10-CM

## 2011-12-26 DIAGNOSIS — R7989 Other specified abnormal findings of blood chemistry: Secondary | ICD-10-CM

## 2011-12-26 DIAGNOSIS — L739 Follicular disorder, unspecified: Secondary | ICD-10-CM

## 2011-12-26 DIAGNOSIS — N179 Acute kidney failure, unspecified: Principal | ICD-10-CM | POA: Diagnosis present

## 2011-12-26 HISTORY — DX: Morbid (severe) obesity due to excess calories: E66.01

## 2011-12-26 LAB — CBC WITH DIFFERENTIAL/PLATELET
Eosinophils Absolute: 0.3 10*3/uL (ref 0.0–0.7)
HCT: 39 % (ref 36.0–46.0)
Hemoglobin: 12.3 g/dL (ref 12.0–15.0)
Lymphs Abs: 2.9 10*3/uL (ref 0.7–4.0)
MCH: 28.8 pg (ref 26.0–34.0)
MCV: 91.3 fL (ref 78.0–100.0)
Monocytes Relative: 5 % (ref 3–12)
Neutrophils Relative %: 72 % (ref 43–77)
RBC: 4.27 MIL/uL (ref 3.87–5.11)

## 2011-12-26 LAB — URINALYSIS, ROUTINE W REFLEX MICROSCOPIC
Glucose, UA: NEGATIVE mg/dL
Hgb urine dipstick: NEGATIVE
Protein, ur: NEGATIVE mg/dL

## 2011-12-26 NOTE — ED Notes (Addendum)
Multiple complaints: c/o nvd chills, abd pain, acid reflux and R leg numbness and swelling. Sx onset 3-4d ago. Last BM ~ 1800. Last emesis at ~ 1700. Here by [employer] bus, uses personal electric w/c. Also mentions HA. No pitting edema. Difficult to discern swelling d/t body habitus.

## 2011-12-27 ENCOUNTER — Inpatient Hospital Stay (HOSPITAL_COMMUNITY): Payer: Medicaid Other

## 2011-12-27 ENCOUNTER — Encounter (HOSPITAL_COMMUNITY): Payer: Self-pay | Admitting: Internal Medicine

## 2011-12-27 ENCOUNTER — Emergency Department (HOSPITAL_COMMUNITY): Payer: Medicaid Other

## 2011-12-27 DIAGNOSIS — N19 Unspecified kidney failure: Secondary | ICD-10-CM

## 2011-12-27 DIAGNOSIS — R51 Headache: Secondary | ICD-10-CM

## 2011-12-27 DIAGNOSIS — M549 Dorsalgia, unspecified: Secondary | ICD-10-CM

## 2011-12-27 DIAGNOSIS — I959 Hypotension, unspecified: Secondary | ICD-10-CM

## 2011-12-27 DIAGNOSIS — N179 Acute kidney failure, unspecified: Principal | ICD-10-CM

## 2011-12-27 DIAGNOSIS — R197 Diarrhea, unspecified: Secondary | ICD-10-CM

## 2011-12-27 LAB — CBC
Hemoglobin: 11.5 g/dL — ABNORMAL LOW (ref 12.0–15.0)
MCH: 29 pg (ref 26.0–34.0)
MCHC: 31.8 g/dL (ref 30.0–36.0)
Platelets: 206 10*3/uL (ref 150–400)

## 2011-12-27 LAB — BASIC METABOLIC PANEL
CO2: 27 mEq/L (ref 19–32)
Calcium: 8 mg/dL — ABNORMAL LOW (ref 8.4–10.5)
Chloride: 102 mEq/L (ref 96–112)
GFR calc non Af Amer: 15 mL/min — ABNORMAL LOW (ref >90)
Glucose, Bld: 80 mg/dL (ref 70–99)
Glucose, Bld: 110 mg/dL — ABNORMAL HIGH (ref 70–99)
Potassium: 4.4 mEq/L (ref 3.5–5.1)
Sodium: 140 mEq/L (ref 135–145)
Sodium: 141 mEq/L (ref 135–145)

## 2011-12-27 LAB — CREATININE, URINE, RANDOM: Creatinine, Urine: 294.42 mg/dL

## 2011-12-27 LAB — MRSA PCR SCREENING: MRSA by PCR: POSITIVE — AB

## 2011-12-27 LAB — SODIUM, URINE, RANDOM: Sodium, Ur: 66 mEq/L

## 2011-12-27 MED ORDER — AMITRIPTYLINE HCL 75 MG PO TABS
75.0000 mg | ORAL_TABLET | Freq: Every day | ORAL | Status: DC
  Administered 2011-12-27: 75 mg via ORAL
  Filled 2011-12-27 (×2): qty 1

## 2011-12-27 MED ORDER — MUPIROCIN 2 % EX OINT
1.0000 "application " | TOPICAL_OINTMENT | Freq: Two times a day (BID) | CUTANEOUS | Status: DC
  Administered 2011-12-27 – 2011-12-28 (×2): 1 via NASAL
  Filled 2011-12-27: qty 22

## 2011-12-27 MED ORDER — SODIUM CHLORIDE 0.9 % IV BOLUS (SEPSIS)
1000.0000 mL | Freq: Once | INTRAVENOUS | Status: AC
  Administered 2011-12-27: 1000 mL via INTRAVENOUS

## 2011-12-27 MED ORDER — HYDROCODONE-ACETAMINOPHEN 10-325 MG PO TABS
1.0000 | ORAL_TABLET | Freq: Four times a day (QID) | ORAL | Status: DC | PRN
  Administered 2011-12-27 – 2011-12-28 (×3): 1 via ORAL
  Filled 2011-12-27 (×4): qty 1

## 2011-12-27 MED ORDER — ALPRAZOLAM 0.5 MG PO TABS
1.0000 mg | ORAL_TABLET | Freq: Two times a day (BID) | ORAL | Status: DC
Start: 2011-12-27 — End: 2011-12-28
  Administered 2011-12-27 – 2011-12-28 (×3): 1 mg via ORAL
  Filled 2011-12-27 (×3): qty 2

## 2011-12-27 MED ORDER — PANTOPRAZOLE SODIUM 40 MG PO TBEC
40.0000 mg | DELAYED_RELEASE_TABLET | Freq: Every day | ORAL | Status: DC
  Administered 2011-12-27 – 2011-12-28 (×2): 40 mg via ORAL
  Filled 2011-12-27: qty 1

## 2011-12-27 MED ORDER — FAMOTIDINE 20 MG PO TABS
20.0000 mg | ORAL_TABLET | Freq: Two times a day (BID) | ORAL | Status: DC
  Administered 2011-12-27 – 2011-12-28 (×3): 20 mg via ORAL
  Filled 2011-12-27 (×4): qty 1

## 2011-12-27 MED ORDER — AMMONIUM LACTATE 5 % EX LOTN
1.0000 "application " | TOPICAL_LOTION | Freq: Two times a day (BID) | CUTANEOUS | Status: DC
  Administered 2011-12-27 – 2011-12-28 (×3): 1 via TOPICAL
  Filled 2011-12-27 (×18): qty 1

## 2011-12-27 MED ORDER — AMMONIUM LACTATE 12 % EX CREA: TOPICAL_CREAM | Freq: Two times a day (BID) | CUTANEOUS | Status: DC

## 2011-12-27 MED ORDER — ALBUTEROL SULFATE HFA 108 (90 BASE) MCG/ACT IN AERS: 1.0000 | INHALATION_SPRAY | RESPIRATORY_TRACT | Status: DC | PRN

## 2011-12-27 MED ORDER — CHLORHEXIDINE GLUCONATE CLOTH 2 % EX PADS
6.0000 | MEDICATED_PAD | Freq: Every day | CUTANEOUS | Status: DC
  Administered 2011-12-28: 6 via TOPICAL

## 2011-12-27 MED ORDER — ALBUTEROL SULFATE (5 MG/ML) 0.5% IN NEBU: 2.5000 mg | INHALATION_SOLUTION | Freq: Four times a day (QID) | RESPIRATORY_TRACT | Status: DC | PRN

## 2011-12-27 MED ORDER — HYDROMORPHONE HCL PF 1 MG/ML IJ SOLN
0.5000 mg | INTRAMUSCULAR | Status: DC | PRN
  Administered 2011-12-27: 1 mg via INTRAVENOUS
  Filled 2011-12-27: qty 1

## 2011-12-27 MED ORDER — ASPIRIN 325 MG PO TABS
325.0000 mg | ORAL_TABLET | Freq: Every day | ORAL | Status: DC
Start: 2011-12-27 — End: 2011-12-28
  Administered 2011-12-27 – 2011-12-28 (×2): 325 mg via ORAL
  Filled 2011-12-27 (×2): qty 1

## 2011-12-27 MED ORDER — ONDANSETRON HCL 4 MG/2ML IJ SOLN
4.0000 mg | Freq: Four times a day (QID) | INTRAMUSCULAR | Status: DC | PRN
  Filled 2011-12-27: qty 2

## 2011-12-27 MED ORDER — LUBRISKIN EX LOTN
TOPICAL_LOTION | Freq: Two times a day (BID) | CUTANEOUS | Status: DC
  Administered 2011-12-27 – 2011-12-28 (×3): via TOPICAL
  Filled 2011-12-27 (×18): qty 562

## 2011-12-27 MED ORDER — CLONIDINE HCL 0.2 MG PO TABS: 0.2000 mg | ORAL_TABLET | Freq: Two times a day (BID) | ORAL | Status: DC

## 2011-12-27 MED ORDER — SODIUM CHLORIDE 0.9 % IV BOLUS (SEPSIS)
500.0000 mL | Freq: Once | INTRAVENOUS | Status: AC
  Administered 2011-12-27: 08:00:00 via INTRAVENOUS

## 2011-12-27 MED ORDER — CETAPHIL MOISTURIZING EX LOTN
TOPICAL_LOTION | Freq: Two times a day (BID) | CUTANEOUS | Status: DC
Start: 2011-12-27 — End: 2011-12-27
  Filled 2011-12-27: qty 473

## 2011-12-27 MED ORDER — HEPARIN SODIUM (PORCINE) 5000 UNIT/ML IJ SOLN
5000.0000 [IU] | Freq: Three times a day (TID) | INTRAMUSCULAR | Status: DC
  Administered 2011-12-27: 5000 [IU] via SUBCUTANEOUS
  Filled 2011-12-27 (×7): qty 1

## 2011-12-27 MED ORDER — ATORVASTATIN CALCIUM 20 MG PO TABS
20.0000 mg | ORAL_TABLET | Freq: Every day | ORAL | Status: DC
  Administered 2011-12-27 – 2011-12-28 (×2): 20 mg via ORAL
  Filled 2011-12-27 (×2): qty 1

## 2011-12-27 MED ORDER — SODIUM CHLORIDE 0.9 % IV SOLN
INTRAVENOUS | Status: DC
  Administered 2011-12-27 – 2011-12-28 (×4): via INTRAVENOUS

## 2011-12-27 MED ORDER — ONDANSETRON HCL 4 MG/2ML IJ SOLN
4.0000 mg | Freq: Once | INTRAMUSCULAR | Status: AC
  Administered 2011-12-27: 4 mg via INTRAVENOUS
  Filled 2011-12-27: qty 2

## 2011-12-27 NOTE — ED Notes (Signed)
Patient transported to X-ray 

## 2011-12-27 NOTE — Progress Notes (Signed)
[id number].NSG BP 86/46;MANUAL 85/45 MD NOTIFIED NEW ORDERS NOTED

## 2011-12-27 NOTE — H&P (Signed)
[facility] History and Physical  Kara Vincent [id number] DOB: 1966/03/11 DOA: 12/26/2011  Referring physician: ED PCP: Henderson Baltimore, MD  Specialists: None  Chief Complaint: N/V/D  HPI: Kara Vincent is a 46 y.o. female who presents with 4 day history of N/V/D, feeling very dehydrated, mild L sided abdominal pain, fever and chills.  Patient reports no sick contacts, states her father had been trying to get her to go to the ED for past few days but patient didn't go until today.  Unfortunately lab work in the ED revealed the patient to in acute kidney failure presumably from her severe dehydration.  Hospitalist service has been asked to admit the patient.  Review of Systems: 12 systems reviewed and negative except as per HPI.  Past Medical History  Diagnosis Date  . Asthma   . GERD (gastroesophageal reflux disease)   . Hypertension   . Homelessness   . Low back pain   . 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 Arthritis   . Gout   . Hyperlipemia     Past Surgical History  Procedure Date  . Tubal ligation   . Cesarean section    Social History:  reports that she quit smoking about 8 years ago. Her smoking use included Cigarettes. She quit smokeless tobacco use about 33 years ago. She reports that she does not drink alcohol or use illicit drugs.   Allergies  Allergen Reactions  . Azithromycin Hives and Rash  . Doxycycline Rash  . Ibuprofen Rash     dizziness  . Sulfamethoxazole W-Trimethoprim Rash    Family History  Problem Relation Age of Onset  . Asthma Mother   . Diabetes Father   . Heart disease Father   . Stroke Father     Prior to Admission medications   Medication Sig Start Date End Date Taking? Authorizing Provider  albuterol (PROVENTIL HFA) 108 (90 BASE) MCG/ACT inhaler Inhale 1 puff into the lungs every 4 (four) hours as needed. For asthma relief   Yes Historical Provider, MD  albuterol (PROVENTIL) (2.5 MG/3ML) 0.083%  nebulizer solution Take 3 mLs (2.5 mg total) by nebulization every 6 (six) hours as needed for wheezing. 05/27/11 05/26/12 Yes Tatyana A Kirichenko, PA  ALPRAZolam (XANAX) 1 MG tablet Take 1 mg by mouth 2 (two) times daily.    Yes Historical Provider, MD  amitriptyline (ELAVIL) 75 MG tablet Take 75 mg by mouth at bedtime.   Yes Historical Provider, MD  ammonium lactate (AMLACTIN) 12 % cream Apply topically 2 (two) times daily. In a thin layer on affected area    Yes Historical Provider, MD  aspirin 325 MG tablet Take 325 mg by mouth daily.   Yes Historical Provider, MD  atorvastatin (LIPITOR) 20 MG tablet Take 20 mg by mouth daily.   Yes Historical Provider, MD  cloNIDine (CATAPRES) 0.2 MG tablet Take 0.2 mg by mouth 2 (two) times daily.   Yes Historical Provider, MD  diphenhydrAMINE (BENADRYL) 25 mg capsule Take 25 mg by mouth every 6 (six) hours as needed. For itching   Yes Historical Provider, MD  Emollient (EUCERIN) lotion Apply topically 2 (two) times daily. On affected areas    Yes Historical Provider, MD  famotidine (PEPCID) 20 MG tablet Take 1 tablet (20 mg total) by mouth 2 (two) times daily. 06/15/11 06/14/12 Yes Garald Balding, NP  furosemide (LASIX) 40 MG tablet Take 40 mg by mouth daily.   Yes Historical Provider,  MD  HYDROcodone-acetaminophen (NORCO) 10-325 MG per tablet Take 1 tablet by mouth every 6 (six) hours as needed. For pain   Yes Historical Provider, MD  ketoconazole (NIZORAL) 200 MG tablet Take 200 mg by mouth daily.   Yes Historical Provider, MD  lubiprostone (AMITIZA) 24 MCG capsule Take 24 mcg by mouth every evening.    Yes Historical Provider, MD  pantoprazole (PROTONIX) 40 MG tablet Take 40 mg by mouth daily.   Yes Historical Provider, MD  promethazine (PHENERGAN) 25 MG tablet Take 25 mg by mouth every 6 (six) hours as needed. For nausea/vomiting   Yes Historical Provider, MD   Physical Exam: Filed Vitals:   12/27/11 0005 12/27/11 0121 12/27/11 0200 12/27/11 0245  BP: 80/52  101/66 97/48 110/54  Pulse: 69 74 89 84  Temp:      TempSrc:      Resp:  18    SpO2: 100% 100% 97% 98%     General:  NAD, lying in hospital bed on side, morbidly obese  Eyes: PEERLA EOMI  ENT: dry mucous membranes  Neck: supple w/o JVD  Cardiovascular: RRR w/o MRG  Respiratory: CTA B  Abdomen: soft, mild L sided tenderness, obese, hyperactive BS, no rebound, no guarding  Skin: no rash no lesion  Musculoskeletal: MAE  Psychiatric: normal tone  Neurologic: AAOx3, grossly non-focal  Labs on Admission:  Basic Metabolic Panel:  Lab 0000000 2239  NA 140  K 4.4  CL 102  CO2 27  GLUCOSE 80  BUN 47*  CREATININE 4.01*  CALCIUM 8.8  MG --  PHOS --   Liver Function Tests: No results found for this basename: AST:5,ALT:5,ALKPHOS:5,BILITOT:5,PROT:5,ALBUMIN:5 in the last 168 hours No results found for this basename: LIPASE:5,AMYLASE:5 in the last 168 hours No results found for this basename: AMMONIA:5 in the last 168 hours CBC:  Lab 12/26/11 2239  WBC 14.4*  NEUTROABS 10.5*  HGB 12.3  HCT 39.0  MCV 91.3  PLT 241   Cardiac Enzymes: No results found for this basename: CKTOTAL:5,CKMB:5,CKMBINDEX:5,TROPONINI:5 in the last 168 hours  BNP (last 3 results)  Basename 09/03/11 2200 01/14/11 0700  PROBNP 67.8 61.5   CBG: No results found for this basename: GLUCAP:5 in the last 168 hours  Radiological Exams on Admission: Dg Abd Acute W/chest  12/27/2011  *RADIOLOGY REPORT*  Clinical Data: Abdominal pain  ACUTE ABDOMEN SERIES (ABDOMEN 2 VIEW & CHEST 1 VIEW)  Comparison: CT 11/28/2011  Findings: Low lung volumes.  No focal infiltrate.  Heart size upper limits normal.  No effusion.  No free air.  There is mild gaseous distention of the stomach.  The small bowel is decompressed. Moderate fecal material in the proximal colon, decompressed distally.  No abnormal abdominal calcifications.  Regional bones unremarkable.  IMPRESSION: 1.  Nonobstructive bowel gas pattern. 2.  No  free air.   Original Report Authenticated By: Trecia Rogers, M.D.     EKG: Independently reviewed.  Assessment/Plan Principal Problem:  *Acute kidney failure Active Problems:  Dehydration  Headache  Nausea & vomiting  Diarrhea   1. Acute kidney Failure - meets rifle F criteria with creatine 4 up from baseline of 1.0-1.2 range, pre-renal azotemia vs ATN, holding nephrotoxic Lasix, peripheral smear ordered to r/o HUS given N/V/D though I doubt this.  B renal US ordered although given patient size may have poor image quality, strict Is and Os, probably needs nephrology consult tomorrow given the degree of acute injury, checking lactate, but no indications at this time  for emergent / urgent dialysis now that I can see, but as I told patient unless kidney function turned around she could potentially wind up requiring this. 2. Dehydration - causing #1, got 2L bolus already in ED, NS @ 150 /hr 3. N/V/D - stool culture ordered, causing #2, ordering zofran, suspect infectious etiology, no evidence of acute abdomen, holding lubiprostone and ketoconazole for now as these are known to cause GI upset as side effects though seems less likely the cause. 4. Leukocytosis - likely secondary to #2, continue to monitor, no indications at this point in time for ABx therapy  5. HTN - holding BP meds as her BP was low in ED and lasix nephrotoxic, clonidine was also held so may need to cover patient with PRN med if rebound HTN occurs.  Spoke with patient at length, all questions answered.  Code Status: Full Code Family Communication: No family at bed side Disposition Plan: Admit to inpatient, suspect stay > 2 days with rifle F criteria  Time spent: 70 min  GARDNER, JARED M. [facility] Pager [phone]  If 7PM-7AM, please contact night-coverage [email or web address] Password [email or web address] 12/27/2011, 3:13 AM

## 2011-12-27 NOTE — Progress Notes (Signed)
[facility] PROGRESS NOTE  Kara Vincent [id number] DOB: July 05, 1965 DOA: 12/26/2011 PCP: Henderson [place], MD  Assessment/Plan: Principal Problem: Acute kidney failure -baseline Cr. 1.0, the most likely cause of her AKI is 2/2 to N/V/D and medication lasix. -creatinine improving with IV fluids. -patient tolerating orals.  Nausea & vomiting/Diarrhea: -stools cultures will probably be negative. As patient has no history of HIV. Check HIV -will go ahead and start her on liq diet, continue zofran. -patient leukocytosis continues to decrease, has remained afebrile. Currently on no antibiotics.  Headache: -resolved.  Code Status: full code Disposition Plan: home   Consultants:  none  Procedures:  none  Antibiotics:  none  HPI/Subjective: patient complaining of feeling tired.  Objective: Filed Vitals:   12/27/11 0121 12/27/11 0200 12/27/11 0245 12/27/11 0441  BP: 101/66 97/48 110/54 99/82  Pulse: 74 89 84 81  Temp:    98 F (36.7 C)  TempSrc:    Oral  Resp: 18   20  Height:    5\' 5"  (1.651 m)  Weight:    181.9 kg (401 lb 0.3 oz)  SpO2: 100% 97% 98% 96%    Intake/Output Summary (Last 24 hours) at 12/27/11 0736 Last data filed at 12/27/11 0735  Gross per 24 hour  Intake   3000 ml  Output    250 ml  Net   2750 ml   Filed Weights   12/27/11 0441  Weight: 181.9 kg (401 lb 0.3 oz)    Exam:   General:  A&O x3   Cardiovascular: RRR  Respiratory: good air movement CTA b/l  Abdomen: + BS NT, ND and Soft.  Data Reviewed: Basic Metabolic Panel:  Lab [id number] 0309 12/26/11 2239  NA 141 140  K 4.0 4.4  CL 106 102  CO2 24 27  GLUCOSE 110* 80  BUN 46* 47*  CREATININE 3.46* 4.01*  CALCIUM 8.0* 8.8  MG -- --  PHOS -- --   Liver Function Tests: No results found for this basename: AST:5,ALT:5,ALKPHOS:5,BILITOT:5,PROT:5,ALBUMIN:5 in the last 168 hours No results found for this basename: LIPASE:5,AMYLASE:5 in the last 168 hours No results  found for this basename: AMMONIA:5 in the last 168 hours CBC:  Lab 12/27/11 0309 12/26/11 2239  WBC 11.0* 14.4*  NEUTROABS -- 10.5*  HGB 11.5* 12.3  HCT 36.2 39.0  MCV 91.2 91.3  PLT 206 241   Cardiac Enzymes: No results found for this basename: CKTOTAL:5,CKMB:5,CKMBINDEX:5,TROPONINI:5 in the last 168 hours BNP (last 3 results)  Basename 09/03/11 2200 01/14/11 0700  PROBNP 67.8 61.5   CBG: No results found for this basename: GLUCAP:5 in the last 168 hours  Recent Results (from the past 240 hour(s))  MRSA PCR SCREENING     Status: Abnormal   Collection Time   12/27/11  4:42 AM      Component Value Range Status Comment   MRSA by PCR POSITIVE (*) NEGATIVE Final      Studies: US Renal  12/27/2011  *RADIOLOGY REPORT*  Clinical Data: Acute renal failure  RENAL/URINARY TRACT ULTRASOUND COMPLETE  Comparison:  CT 11/28/2011  Findings:  Right Kidney:  11 cm. No hydronephrosis.  Well-preserved cortex. Normal size and parenchymal echotexture without focal abnormalities.  Left Kidney:  11.1 cm. No hydronephrosis.  Well-preserved cortex. Normal size and parenchymal echotexture without focal abnormalities.  Bladder:  Incompletely distended, unremarkable.  IMPRESSION:  Negative.  No hydronephrosis.   Original Report Authenticated By: Trecia Rogers, M.D.    Dg Abd Acute W/chest  12/27/2011  *  RADIOLOGY REPORT*  Clinical Data: Abdominal pain  ACUTE ABDOMEN SERIES (ABDOMEN 2 VIEW & CHEST 1 VIEW)  Comparison: CT 11/28/2011  Findings: Low lung volumes.  No focal infiltrate.  Heart size upper limits normal.  No effusion.  No free air.  There is mild gaseous distention of the stomach.  The small bowel is decompressed. Moderate fecal material in the proximal colon, decompressed distally.  No abnormal abdominal calcifications.  Regional bones unremarkable.  IMPRESSION: 1.  Nonobstructive bowel gas pattern. 2.  No free air.   Original Report Authenticated By: Trecia Rogers, M.D.      Scheduled Meds:   . ALPRAZolam  1 mg Oral BID  . amitriptyline  75 mg Oral QHS  . ammonium lactate  1 application Topical BID  . aspirin  325 mg Oral Daily  . atorvastatin  20 mg Oral Daily  . famotidine  20 mg Oral BID  . heparin  5,000 Units Subcutaneous Q8H  . lubriskin   Topical BID  . ondansetron  4 mg Intravenous Once  . pantoprazole  40 mg Oral Daily  . sodium chloride  1,000 mL Intravenous Once  . sodium chloride  1,000 mL Intravenous Once  . DISCONTD: ammonium lactate   Topical BID  . DISCONTD: cetaphil   Topical BID  . DISCONTD: cloNIDine  0.2 mg Oral BID   Continuous Infusions:   . sodium chloride 150 mL/hr at 12/27/11 [place], ABRAHAM  [facility] Pager [phone]. If 8PM-8AM, please contact night-coverage at [email or web address], password [address] [place] [address] 12/27/2011, 7:36 AM  LOS: 1 day

## 2011-12-27 NOTE — ED Provider Notes (Signed)
History     CSN: [id number]  Arrival date & time 12/26/11  2154   First MD Initiated Contact with Patient 12/27/11 0008      Chief Complaint  Patient presents with  . Diarrhea  . Emesis  . Gastrophageal Reflux  . Abdominal Pain  . Numbness    (Consider location/radiation/quality/duration/timing/severity/associated sxs/prior treatment) HPI Pt  Presents with c/o nausea/vomiting and diarrhea.  Pt states she has had approx 3 episodes of loose stools per day over the past week as well as approx 3 episodes of nonbloody nonbilious emesis over about the same period of time.  Denies abdominal pain.  No fever/chills.  She states she has been trying to drink liquids but they make her nauseated.  She states she has tried phenergan with only temporary relief.  No sob, no chest pain.  No fainting.  There are no other associated systemic symptoms, there are no other alleviating or modifying factors.   Past Medical History  Diagnosis Date  . Asthma   . GERD (gastroesophageal reflux disease)   . Hypertension   . Homelessness   . Low back pain   . 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 Arthritis   . Gout   . Hyperlipemia   . Morbid obesity     uses motor wheel chair    Past Surgical History  Procedure Date  . Tubal ligation   . Cesarean section     Family History  Problem Relation Age of Onset  . Asthma Mother   . Diabetes Father   . Heart disease Father   . Stroke Father     History  Substance Use Topics  . Smoking status: Former Smoker    Types: Cigarettes    Quit date: 03/25/2003  . Smokeless tobacco: Former Systems developer    Quit date: 05/27/1978  . Alcohol Use: No    OB History    Grav Para Term Preterm Abortions TAB SAB Ect Mult Living                  Review of Systems ROS reviewed and all otherwise negative except for mentioned in HPI  Allergies  Azithromycin; Doxycycline; Ibuprofen; and Sulfamethoxazole w-trimethoprim  Home Medications   No  current outpatient prescriptions on file.  BP 99/82  Pulse 81  Temp 98 F (36.7 C) (Oral)  Resp 20  Ht 5\' 5"  (1.651 m)  Wt 401 lb 0.3 oz (181.9 kg)  BMI 66.73 kg/m2  SpO2 96%  LMP 03/24/2009 Vitals reviewed Physical Exam Physical Examination: General appearance - alert, well appearing, and in no distress Mental status - alert, oriented to person, place, and time Eyes - PERRL, no conjunctival injection Mouth - mucous membranes moist, pharynx normal without lesions Chest - clear to auscultation, no wheezes, rales or rhonchi, symmetric air entry Heart - normal rate, regular rhythm, normal S1, S2, no murmurs, rubs, clicks or gallops Abdomen - soft, nontender, nondistended, obese, nabs Extremities - peripheral pulses normal, no pedal edema, no clubbing or cyanosis Skin - normal coloration and turgor, no rashes  ED Course  Procedures (including critical care time)  2:24 AM  D/w [facility]- he will come to the ED for evaluation.  Pt to be admitted.  Declined temporary admission orders to be written.    Labs Reviewed  BASIC METABOLIC PANEL - Abnormal; Notable for the following:    BUN 47 (*)     Creatinine, Ser 4.01 (*)  GFR calc non Af Amer 12 (*)     GFR calc Af Amer 14 (*)     All other components within normal limits  CBC WITH DIFFERENTIAL - Abnormal; Notable for the following:    WBC 14.4 (*)     Neutro Abs 10.5 (*)     All other components within normal limits  URINALYSIS, ROUTINE W REFLEX MICROSCOPIC - Abnormal; Notable for the following:    APPearance CLOUDY (*)     All other components within normal limits  CBC - Abnormal; Notable for the following:    WBC 11.0 (*)     Hemoglobin 11.5 (*)     All other components within normal limits  BASIC METABOLIC PANEL - Abnormal; Notable for the following:    Glucose, Bld 110 (*)     BUN 46 (*)     Creatinine, Ser 3.46 (*)     Calcium 8.0 (*)     GFR calc non Af Amer 15 (*)     GFR calc Af Amer 17 (*)     All  other components within normal limits  MRSA PCR SCREENING - Abnormal; Notable for the following:    MRSA by PCR POSITIVE (*)     All other components within normal limits  SODIUM, URINE, RANDOM  CREATININE, URINE, RANDOM  LACTIC ACID, PLASMA  TECHNOLOGIST SMEAR REVIEW  STOOL CULTURE  HIV ANTIBODY (ROUTINE TESTING)   US Renal  12/27/2011  *RADIOLOGY REPORT*  Clinical Data: Acute renal failure  RENAL/URINARY TRACT ULTRASOUND COMPLETE  Comparison:  CT 11/28/2011  Findings:  Right Kidney:  11 cm. No hydronephrosis.  Well-preserved cortex. Normal size and parenchymal echotexture without focal abnormalities.  Left Kidney:  11.1 cm. No hydronephrosis.  Well-preserved cortex. Normal size and parenchymal echotexture without focal abnormalities.  Bladder:  Incompletely distended, unremarkable.  IMPRESSION:  Negative.  No hydronephrosis.   Original Report Authenticated By: Trecia Rogers, M.D.    Dg Abd Acute W/chest  12/27/2011  *RADIOLOGY REPORT*  Clinical Data: Abdominal pain  ACUTE ABDOMEN SERIES (ABDOMEN 2 VIEW & CHEST 1 VIEW)  Comparison: CT 11/28/2011  Findings: Low lung volumes.  No focal infiltrate.  Heart size upper limits normal.  No effusion.  No free air.  There is mild gaseous distention of the stomach.  The small bowel is decompressed. Moderate fecal material in the proximal colon, decompressed distally.  No abnormal abdominal calcifications.  Regional bones unremarkable.  IMPRESSION: 1.  Nonobstructive bowel gas pattern. 2.  No free air.   Original Report Authenticated By: Dillard Cannon III, M.D.      1. Renal failure   2. Vomiting and diarrhea   3. Acute kidney failure   4. Backache, unspecified   5. Diarrhea   6. Headache   7. Hypotension       MDM  Pt presenting with n/v/d over the past week.  Pt transiently hypontensive- sbp in 80s- this responded to fluids.  Pt found to be in ARF with creat 4.  Normal potassium.  Pt given IV hydration in the ED as well as zofran.   D/w [facility], pt to be admitted to their service for further management.         Threasa Beards, MD 12/29/11 [phone]

## 2011-12-27 NOTE — ED Notes (Signed)
Patient transported to Ultrasound 

## 2011-12-27 NOTE — Progress Notes (Signed)
[id number] md notified of repeat bp 80/52 manual; pt is asymptomatic, eating and sitting at the bedside.

## 2011-12-27 NOTE — Progress Notes (Signed)
Pt arrived to the floor via motorized wheelchair, accompanied by Nursing staff. Admission hx and assessment completed. Pt had complaints of pain treated with medication and relief achieved. No complaints of shortness of breath. Will continue to assess. Bed in lowest position, wheels locked, and call bell within reach.

## 2011-12-28 DIAGNOSIS — R1110 Vomiting, unspecified: Secondary | ICD-10-CM

## 2011-12-28 LAB — BASIC METABOLIC PANEL
BUN: 28 mg/dL — ABNORMAL HIGH (ref 6–23)
Chloride: 106 mEq/L (ref 96–112)
Creatinine, Ser: 1.75 mg/dL — ABNORMAL HIGH (ref 0.50–1.10)
GFR calc Af Amer: 39 mL/min — ABNORMAL LOW (ref >90)
Glucose, Bld: 86 mg/dL (ref 70–99)

## 2011-12-28 MED ORDER — FUROSEMIDE 40 MG PO TABS: 40.0000 mg | ORAL_TABLET | Freq: Every day | ORAL | Status: DC

## 2011-12-28 MED ORDER — HYDROCODONE-ACETAMINOPHEN 10-325 MG PO TABS: 1.0000 | ORAL_TABLET | Freq: Four times a day (QID) | ORAL | Status: DC | PRN

## 2011-12-28 MED ORDER — ALPRAZOLAM 1 MG PO TABS: 1.0000 mg | ORAL_TABLET | Freq: Two times a day (BID) | ORAL | Status: DC

## 2011-12-28 NOTE — Discharge Summary (Signed)
Physician Discharge Summary  Kara Vincent [id number] DOB: September 02, 1965 DOA: 12/26/2011  PCP: Henderson Baltimore, MD  Admit date: 12/26/2011 Discharge date: 12/28/2011  Recommendations for Outpatient Follow-up:  1. Will follow up with primary care doctor in 2 weeks here will check a basic metabolic panel To followup on her creatinine and we'll also check a blood pressure medication.   Discharge Diagnoses:  Principal Problem:  *Acute kidney failure Active Problems:  Dehydration  Headache  Nausea & vomiting  Diarrhea   Discharge Condition: Stable  Diet recommendation: Heart healthy diet  Filed Weights   12/27/11 0441 12/27/11 2228  Weight: 181.9 kg (401 lb 0.3 oz) 182 kg (401 lb 3.8 oz)    History of present illness:  46 y.o. female who presents with 4 day history of N/V/D, feeling very dehydrated, mild L sided abdominal pain, fever and chills. Patient reports no sick contacts, states her father had been trying to get her to go to the ED for past few days but patient didn't go until today.  Unfortunately lab work in the ED revealed the patient to in acute kidney failure presumably from her severe dehydration. Hospitalist service has been asked to admit the patient.   Hospital Course:  Principal Problem:  *Acute kidney failure: -Is most likely secondary to nausea vomiting and diuretics. These were held she was given IV fluids and this improved. A renal ultrasound was done that showed results as below. She will continue to hold her Lasix for 5 more days and restart lasix.  Headache --Most likely secondary to decreased intravascular volume does resolved.  Nausea & vomiting/ Diarrhea -Is most likely a viral gastroenteritis she was treated conservatively with IV fluids n.p.o. and she was able to tolerate her diet and was advanced.  Procedures:  None  Consultations:  None  Discharge Exam: Filed Vitals:   12/27/11 1500 12/27/11 1758 12/27/11 2228 12/28/11 0435  BP:  80/52 113/83 95/45 99/52   Pulse:  84 86 90  Temp:  97.5 F (36.4 C) 98 F (36.7 C) 98 F (36.7 C)  TempSrc:  Oral Oral Oral  Resp:  16 18 18   Height:      Weight:   182 kg (401 lb 3.8 oz)   SpO2:  97% 97% 99%    General: Awake alert and oriented x3 Cardiovascular: Regular rate and Respiratory: Good air movement clear to auscultation  Discharge Instructions  Discharge Orders    Future Orders Please Complete By Expires   Diet - low sodium heart healthy      Increase activity slowly          Medication List     As of 12/28/2011  7:25 AM    TAKE these medications         ALPRAZolam 1 MG tablet   Commonly known as: XANAX   Take 1 tablet (1 mg total) by mouth 2 (two) times daily.      amitriptyline 75 MG tablet   Commonly known as: ELAVIL   Take 75 mg by mouth at bedtime.      ammonium lactate 12 % cream   Commonly known as: AMLACTIN   Apply topically 2 (two) times daily. In a thin layer on affected area      aspirin 325 MG tablet   Take 325 mg by mouth daily.      atorvastatin 20 MG tablet   Commonly known as: LIPITOR   Take 20 mg by mouth daily.      cloNIDine 0.2  MG tablet   Commonly known as: CATAPRES   Take 0.2 mg by mouth 2 (two) times daily.      diphenhydrAMINE 25 mg capsule   Commonly known as: BENADRYL   Take 25 mg by mouth every 6 (six) hours as needed. For itching      eucerin lotion   Apply topically 2 (two) times daily. On affected areas      famotidine 20 MG tablet   Commonly known as: PEPCID   Take 1 tablet (20 mg total) by mouth 2 (two) times daily.      furosemide 40 MG tablet   Commonly known as: LASIX   Take 1 tablet (40 mg total) by mouth daily.   Start taking on: 01/01/2012      HYDROcodone-acetaminophen 10-325 MG per tablet   Commonly known as: NORCO   Take 1 tablet by mouth every 6 (six) hours as needed. For pain      ketoconazole 200 MG tablet   Commonly known as: NIZORAL   Take 200 mg by mouth daily.      lubiprostone  24 MCG capsule   Commonly known as: AMITIZA   Take 24 mcg by mouth every evening.      pantoprazole 40 MG tablet   Commonly known as: PROTONIX   Take 40 mg by mouth daily.      promethazine 25 MG tablet   Commonly known as: PHENERGAN   Take 25 mg by mouth every 6 (six) hours as needed. For nausea/vomiting      PROVENTIL HFA 108 (90 BASE) MCG/ACT inhaler   Generic drug: albuterol   Inhale 1 puff into the lungs every 4 (four) hours as needed. For asthma relief      albuterol (2.5 MG/3ML) 0.083% nebulizer solution   Commonly known as: PROVENTIL   Take 3 mLs (2.5 mg total) by nebulization every 6 (six) hours as needed for wheezing.           Follow-up Information    Follow up with Henderson Baltimore, MD. In 2 weeks.          The results of significant diagnostics from this hospitalization (including imaging, microbiology, ancillary and laboratory) are listed below for reference.    Significant Diagnostic Studies: US Renal  12/27/2011  *RADIOLOGY REPORT*  Clinical Data: Acute renal failure  RENAL/URINARY TRACT ULTRASOUND COMPLETE  Comparison:  CT 11/28/2011  Findings:  Right Kidney:  11 cm. No hydronephrosis.  Well-preserved cortex. Normal size and parenchymal echotexture without focal abnormalities.  Left Kidney:  11.1 cm. No hydronephrosis.  Well-preserved cortex. Normal size and parenchymal echotexture without focal abnormalities.  Bladder:  Incompletely distended, unremarkable.  IMPRESSION:  Negative.  No hydronephrosis.   Original Report Authenticated By: Trecia Rogers, M.D.    Dg Abd Acute W/chest  12/27/2011  *RADIOLOGY REPORT*  Clinical Data: Abdominal pain  ACUTE ABDOMEN SERIES (ABDOMEN 2 VIEW & CHEST 1 VIEW)  Comparison: CT 11/28/2011  Findings: Low lung volumes.  No focal infiltrate.  Heart size upper limits normal.  No effusion.  No free air.  There is mild gaseous distention of the stomach.  The small bowel is decompressed. Moderate fecal material in the proximal  colon, decompressed distally.  No abnormal abdominal calcifications.  Regional bones unremarkable.  IMPRESSION: 1.  Nonobstructive bowel gas pattern. 2.  No free air.   Original Report Authenticated By: Trecia Rogers, M.D.     Microbiology: Recent Results (from the past 240 hour(s))  MRSA  PCR SCREENING     Status: Abnormal   Collection Time   12/27/11  4:42 AM      Component Value Range Status Comment   MRSA by PCR POSITIVE (*) NEGATIVE Final      Labs: Basic Metabolic Panel:  Lab [id number] 0309 12/26/11 2239  NA 141 140  K 4.0 4.4  CL 106 102  CO2 24 27  GLUCOSE 110* 80  BUN 46* 47*  CREATININE 3.46* 4.01*  CALCIUM 8.0* 8.8  MG -- --  PHOS -- --   Liver Function Tests: No results found for this basename: AST:5,ALT:5,ALKPHOS:5,BILITOT:5,PROT:5,ALBUMIN:5 in the last 168 hours No results found for this basename: LIPASE:5,AMYLASE:5 in the last 168 hours No results found for this basename: AMMONIA:5 in the last 168 hours CBC:  Lab 12/27/11 0309 12/26/11 2239  WBC 11.0* 14.4*  NEUTROABS -- 10.5*  HGB 11.5* 12.3  HCT 36.2 39.0  MCV 91.2 91.3  PLT 206 241   Cardiac Enzymes: No results found for this basename: CKTOTAL:5,CKMB:5,CKMBINDEX:5,TROPONINI:5 in the last 168 hours BNP: BNP (last 3 results)  Basename 09/03/11 2200 01/14/11 0700  PROBNP 67.8 61.5   CBG: No results found for this basename: GLUCAP:5 in the last 168 hours  Time coordinating discharge: 30 minutes  Signed:  Charlynne Cousins  [facility] 12/28/2011, 7:25 AM

## 2011-12-30 ENCOUNTER — Emergency Department (HOSPITAL_COMMUNITY)
Admission: EM | Admit: 2011-12-30 | Discharge: 2011-12-31 | Disposition: A | Discharge status: Home / Self Care | Payer: Medicaid Other | Attending: Emergency Medicine | Admitting: Emergency Medicine

## 2011-12-30 ENCOUNTER — Encounter (HOSPITAL_COMMUNITY): Payer: Self-pay | Admitting: Emergency Medicine

## 2011-12-30 DIAGNOSIS — M7989 Other specified soft tissue disorders: Secondary | ICD-10-CM | POA: Insufficient documentation

## 2011-12-30 DIAGNOSIS — R109 Unspecified abdominal pain: Secondary | ICD-10-CM | POA: Insufficient documentation

## 2011-12-30 DIAGNOSIS — Z7982 Long term (current) use of aspirin: Secondary | ICD-10-CM | POA: Insufficient documentation

## 2011-12-30 DIAGNOSIS — R51 Headache: Secondary | ICD-10-CM | POA: Insufficient documentation

## 2011-12-30 DIAGNOSIS — J45909 Unspecified asthma, uncomplicated: Secondary | ICD-10-CM | POA: Insufficient documentation

## 2011-12-30 DIAGNOSIS — E785 Hyperlipidemia, unspecified: Secondary | ICD-10-CM | POA: Insufficient documentation

## 2011-12-30 DIAGNOSIS — M109 Gout, unspecified: Secondary | ICD-10-CM | POA: Insufficient documentation

## 2011-12-30 DIAGNOSIS — R42 Dizziness and giddiness: Secondary | ICD-10-CM | POA: Insufficient documentation

## 2011-12-30 DIAGNOSIS — R197 Diarrhea, unspecified: Secondary | ICD-10-CM | POA: Insufficient documentation

## 2011-12-30 DIAGNOSIS — M129 Arthropathy, unspecified: Secondary | ICD-10-CM | POA: Insufficient documentation

## 2011-12-30 DIAGNOSIS — K219 Gastro-esophageal reflux disease without esophagitis: Secondary | ICD-10-CM | POA: Insufficient documentation

## 2011-12-30 DIAGNOSIS — Z79899 Other long term (current) drug therapy: Secondary | ICD-10-CM | POA: Insufficient documentation

## 2011-12-30 DIAGNOSIS — I10 Essential (primary) hypertension: Secondary | ICD-10-CM | POA: Insufficient documentation

## 2011-12-30 DIAGNOSIS — M79609 Pain in unspecified limb: Secondary | ICD-10-CM | POA: Insufficient documentation

## 2011-12-30 DIAGNOSIS — R300 Dysuria: Secondary | ICD-10-CM | POA: Insufficient documentation

## 2011-12-30 LAB — CBC
Hemoglobin: 10.4 g/dL — ABNORMAL LOW (ref 12.0–15.0)
MCH: 29.2 pg (ref 26.0–34.0)
MCHC: 32.2 g/dL (ref 30.0–36.0)
RDW: 14.5 % (ref 11.5–15.5)

## 2011-12-30 LAB — URINALYSIS, ROUTINE W REFLEX MICROSCOPIC
Protein, ur: NEGATIVE mg/dL
Specific Gravity, Urine: 1.025 (ref 1.005–1.030)
Urobilinogen, UA: 0.2 mg/dL (ref 0.0–1.0)

## 2011-12-30 LAB — COMPREHENSIVE METABOLIC PANEL
ALT: 9 U/L (ref 0–35)
Albumin: 3 g/dL — ABNORMAL LOW (ref 3.5–5.2)
Calcium: 8.1 mg/dL — ABNORMAL LOW (ref 8.4–10.5)
GFR calc Af Amer: 61 mL/min — ABNORMAL LOW (ref >90)
Glucose, Bld: 121 mg/dL — ABNORMAL HIGH (ref 70–99)
Sodium: 141 mEq/L (ref 135–145)
Total Protein: 6.3 g/dL (ref 6.0–8.3)

## 2011-12-30 LAB — URINE MICROSCOPIC-ADD ON

## 2011-12-30 LAB — PROTIME-INR: Prothrombin Time: 13.4 seconds (ref 11.6–15.2)

## 2011-12-30 NOTE — ED Notes (Signed)
Attempted IV start x2, was unsuccessful.  Pt requested IV, rn.

## 2011-12-30 NOTE — ED Notes (Signed)
Pt states she was discharged from [facility] on Sunday. States she is still having diarrhea, having trouble urinating, left side abdominal pain and swelling. Pt states that she has kidney issues and where "4 when she was in [facility] last week".

## 2011-12-31 MED ORDER — METOCLOPRAMIDE HCL 5 MG/ML IJ SOLN
10.0000 mg | Freq: Once | INTRAMUSCULAR | Status: AC
  Administered 2011-12-31: 10 mg via INTRAVENOUS
  Filled 2011-12-31: qty 2

## 2011-12-31 MED ORDER — OXYCODONE-ACETAMINOPHEN 5-325 MG PO TABS: 2.0000 | ORAL_TABLET | ORAL | Status: DC | PRN

## 2011-12-31 MED ORDER — HYDROMORPHONE HCL PF 1 MG/ML IJ SOLN
1.0000 mg | Freq: Once | INTRAMUSCULAR | Status: AC
  Administered 2011-12-31: 1 mg via INTRAVENOUS
  Filled 2011-12-31: qty 1

## 2011-12-31 MED ORDER — MORPHINE SULFATE 4 MG/ML IJ SOLN
4.0000 mg | Freq: Once | INTRAMUSCULAR | Status: AC
  Administered 2011-12-31: 4 mg via INTRAVENOUS
  Filled 2011-12-31: qty 1

## 2011-12-31 MED ORDER — DIPHENHYDRAMINE HCL 50 MG/ML IJ SOLN
25.0000 mg | Freq: Once | INTRAMUSCULAR | Status: AC
  Administered 2011-12-31: 25 mg via INTRAVENOUS
  Filled 2011-12-31: qty 1

## 2011-12-31 MED ORDER — SODIUM CHLORIDE 0.9 % IV BOLUS (SEPSIS)
1000.0000 mL | Freq: Once | INTRAVENOUS | Status: AC
  Administered 2011-12-31: 1000 mL via INTRAVENOUS

## 2011-12-31 NOTE — ED Provider Notes (Signed)
I received sign out from Dr. Prince Rome. Basically this is a 46 yo F with dehydration and headache. Her labs and UA unremarkable. Headache improves with meds. Will d/c home with outpatient f/u.   Wandra Arthurs, MD 12/31/11 [phone]

## 2011-12-31 NOTE — ED Provider Notes (Signed)
History     CSN: [id number]  Arrival date & time 12/30/11  1741   First MD Initiated Contact with Patient 12/30/11 1957      Chief Complaint  Patient presents with  . Headache  . Leg Swelling  . Diarrhea    (Consider location/radiation/quality/duration/timing/severity/associated sxs/prior treatment) HPI Comments: [name] presents for evaluation of several issues.  She was discharged from the hospital on 10/6 after an evaluation secondary to dehydration.  She states she does not feel any better now than before she was admitted to the hospital.  She states she has a severe, throbbing headache.  She denies fever, photophobia, earache, sore throat, neck stiffness, or ataxia.  She is also having pain in her right leg.  This pain is not a new pain but an issue that has been ongoing for a while.  She denies any trauma, twisting, or awkward steps.  She does report some swelling in the right knee.  She reports having loose stools that started prior to being admitted to the hospital.  Lastly she reports having "kidney issues".  She is unable to explain quite what that means but reports being treated for renal failure and dehydration while in the hospital.  The history is provided by the patient. No language interpreter was used.    Past Medical History  Diagnosis Date  . Asthma   . GERD (gastroesophageal reflux disease)   . Hypertension   . Homelessness   . Low back pain   . 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 Arthritis   . Gout   . Hyperlipemia   . Morbid obesity     uses motor wheel chair    Past Surgical History  Procedure Date  . Tubal ligation   . Cesarean section     Family History  Problem Relation Age of Onset  . Asthma Mother   . Diabetes Father   . Heart disease Father   . Stroke Father     History  Substance Use Topics  . Smoking status: Former Smoker    Types: Cigarettes    Quit date: 03/25/2003  . Smokeless tobacco: Former Systems developer   Quit date: 05/27/1978  . Alcohol Use: No    OB History    Grav Para Term Preterm Abortions TAB SAB Ect Mult Living                  Review of Systems  Constitutional: Positive for appetite change and fatigue. Negative for fever, chills, diaphoresis and activity change.  HENT: Negative for congestion, sore throat, rhinorrhea, neck pain and neck stiffness.   Eyes: Negative.   Respiratory: Negative for cough and shortness of breath.   Cardiovascular: Positive for leg swelling. Negative for chest pain and palpitations.  Gastrointestinal: Positive for abdominal pain and diarrhea. Negative for nausea, vomiting and blood in stool.  Genitourinary: Positive for dysuria. Negative for hematuria, flank pain and decreased urine volume.  Musculoskeletal: Positive for myalgias, joint swelling, arthralgias and gait problem (chronic difficulty walking, uses a powered chair.). Negative for back pain.  Skin: Negative for color change, rash and wound.  Neurological: Positive for light-headedness and headaches. Negative for tremors, syncope and weakness.  Psychiatric/Behavioral: Positive for dysphoric mood. The patient is not nervous/anxious.     Allergies  Azithromycin; Doxycycline; Ibuprofen; and Sulfamethoxazole w-trimethoprim  Home Medications   Current Outpatient Rx  Name Route Sig Dispense Refill  . ALBUTEROL SULFATE HFA 108 (90 BASE) MCG/ACT IN  AERS Inhalation Inhale 2 puffs into the lungs every 4 (four) hours as needed. For asthma relief    . ALBUTEROL SULFATE (2.5 MG/3ML) 0.083% IN NEBU Nebulization Take 3 mLs (2.5 mg total) by nebulization every 6 (six) hours as needed for wheezing. 75 mL 12  . ALPRAZOLAM 1 MG PO TABS Oral Take 1 tablet (1 mg total) by mouth 2 (two) times daily. 10 tablet 0  . AMITRIPTYLINE HCL 75 MG PO TABS Oral Take 75 mg by mouth at bedtime.    . AMMONIUM LACTATE 12 % EX CREA Topical Apply topically 2 (two) times daily. In a thin layer on affected area for dry patches.      . ASPIRIN 325 MG PO TABS Oral Take 325 mg by mouth daily.    . ATORVASTATIN CALCIUM 20 MG PO TABS Oral Take 20 mg by mouth daily.    [name] CLONIDINE HCL 0.2 MG PO TABS Oral Take 0.2 mg by mouth 2 (two) times daily.    [name] DIPHENHYDRAMINE HCL 25 MG PO CAPS Oral Take 25 mg by mouth every 6 (six) hours as needed. For itching    . EUCERIN EX LOTN Topical Apply topically 2 (two) times daily as needed. On affected areas for dry patches.    [name] FAMOTIDINE 20 MG PO TABS Oral Take 1 tablet (20 mg total) by mouth 2 (two) times daily. 30 tablet 0  . FUROSEMIDE 40 MG PO TABS Oral Take 1 tablet (40 mg total) by mouth daily. 30 tablet   . HYDROCODONE-ACETAMINOPHEN 10-325 MG PO TABS Oral Take 1 tablet by mouth every 6 (six) hours as needed. For pain 10 tablet 0  . KETOCONAZOLE 200 MG PO TABS Oral Take 200 mg by mouth daily.    . LUBIPROSTONE 24 MCG PO CAPS Oral Take 24 mcg by mouth every evening.     [name] PANTOPRAZOLE SODIUM 40 MG PO TBEC Oral Take 40 mg by mouth daily.    [name] PROMETHAZINE HCL 25 MG PO TABS Oral Take 25 mg by mouth every 6 (six) hours as needed. For nausea/vomiting      BP 156/108  Pulse 114  Temp 99.8 F (37.7 C) (Oral)  Resp 17  Ht 5\' 5"  (1.651 m)  SpO2 98%  LMP 03/24/2009  Physical Exam  Nursing note and vitals reviewed. Constitutional: She is oriented to person, place, and time. She appears well-developed and well-nourished. No distress. She is not intubated.       Pt is morbidly obese  HENT:  Head: Normocephalic and atraumatic.  Right Ear: External ear normal.  Left Ear: External ear normal.  Nose: Nose normal.  Mouth/Throat: Oropharynx is clear and moist. No oropharyngeal exudate.  Eyes: Conjunctivae normal are normal. Pupils are equal, round, and reactive to light. Right eye exhibits no discharge. Left eye exhibits no discharge. No scleral icterus.  Neck: Normal range of motion. Neck supple. No JVD present. No tracheal deviation present.  Cardiovascular: Normal rate, regular rhythm,  intact distal pulses and normal pulses.   No extrasystoles are present. Exam reveals distant heart sounds. Exam reveals no gallop and no decreased pulses.   No murmur heard. Pulmonary/Chest: Effort normal. No accessory muscle usage or stridor. No apnea, not tachypneic and not bradypneic. She is not intubated. No respiratory distress. She has decreased breath sounds (breath sounds appear diminished secondary to body habitus, no resp insufficiency noted). She has no wheezes. She has no rhonchi. She has no rales. She exhibits no tenderness.  Abdominal: Soft. Bowel  sounds are normal. She exhibits no distension and no mass. There is no tenderness. There is no rigidity, no rebound, no guarding and no CVA tenderness.  Musculoskeletal:       Note mild bilat lower extremity edema.  Intact ROM at hips, knees, and ankles.  Mild knee swelling noted.  Joint exam is limited secondary to obesity.  Lymphadenopathy:    She has no cervical adenopathy.  Neurological: She is alert and oriented to person, place, and time. No cranial nerve deficit. GCS eye subscore is 4. GCS verbal subscore is 5. GCS motor subscore is 6. She displays no Babinski's sign on the right side. She displays no Babinski's sign on the left side.  Skin: Skin is warm, dry and intact. No rash noted. She is not diaphoretic. No cyanosis. No pallor. Nails show no clubbing.  Psychiatric: Her speech is normal and behavior is normal. Cognition and memory are normal. She exhibits a depressed mood.    ED Course  Procedures (including critical care time)  Labs Reviewed  CBC - Abnormal; Notable for the following:    RBC 3.56 (*)     Hemoglobin 10.4 (*)     HCT 32.3 (*)     All other components within normal limits  COMPREHENSIVE METABOLIC PANEL - Abnormal; Notable for the following:    Glucose, Bld 121 (*)     Creatinine, Ser 1.21 (*)     Calcium 8.1 (*)     Albumin 3.0 (*)     Total Bilirubin 0.2 (*)     GFR calc non Af Amer 52 (*)     GFR calc  Af Amer 61 (*)     All other components within normal limits  URINALYSIS, ROUTINE W REFLEX MICROSCOPIC - Abnormal; Notable for the following:    APPearance CLOUDY (*)     Leukocytes, UA TRACE (*)     All other components within normal limits  URINE MICROSCOPIC-ADD ON - Abnormal; Notable for the following:    Squamous Epithelial / LPF FEW (*)     Bacteria, UA FEW (*)     Crystals URIC ACID CRYSTALS (*)     All other components within normal limits  PROTIME-INR   No results found.   No diagnosis found.    MDM  Pt presents c/o of leg swelling, a headache, and "kidney issues".  She appears comfortable, NAD.  She was discharged from the hosp after an inpt evaluation stay and treatment for dehydration 2 days ago.  She reports that she had these issues prior to being discharged and did not feel well enough to go home at that time.  She has stable vital signs and no focal findings on exam.  Plan routine screening labs, will review notes from recent hospitalization, and reassess.  0015.  Pt stable, NAD.  Continues to report having a headache.  Note improved renal function today as compared to most recent labs.  There is no evidence of a UTI on the urinalysis.  Will treat pt's HA and reassess.  Note elevated uric acid in her urine.  She does have a hx of gout and her knee does not feel warm or tensely swollen.  Her pain could be secondary to a gouty arthropathy.  Secondary to recent renal failure, I will not administer NSAIDs.  It is hard to identify specific landmarks to perform arthrocentesis.  I have a low suspicion for septic arthropathy.  She reports loose stools already - will not administer colchicine.  Will  treat with narcotic pain medication.  0040.  Pt signed over to Dr. Darl Householder.  Plan administer pain medications and reassess.  Pt will likely be discharged home.        Perlie Mayo, MD 12/31/11 [phone]

## 2012-01-30 ENCOUNTER — Encounter (HOSPITAL_COMMUNITY): Payer: Self-pay | Admitting: Emergency Medicine

## 2012-01-30 ENCOUNTER — Emergency Department (HOSPITAL_COMMUNITY)
Admission: EM | Admit: 2012-01-30 | Discharge: 2012-01-30 | Disposition: A | Discharge status: Home / Self Care | Payer: Medicaid Other | Attending: Emergency Medicine | Admitting: Emergency Medicine

## 2012-01-30 DIAGNOSIS — K219 Gastro-esophageal reflux disease without esophagitis: Secondary | ICD-10-CM | POA: Insufficient documentation

## 2012-01-30 DIAGNOSIS — R110 Nausea: Secondary | ICD-10-CM | POA: Insufficient documentation

## 2012-01-30 DIAGNOSIS — M25569 Pain in unspecified knee: Secondary | ICD-10-CM | POA: Insufficient documentation

## 2012-01-30 DIAGNOSIS — M109 Gout, unspecified: Secondary | ICD-10-CM | POA: Insufficient documentation

## 2012-01-30 DIAGNOSIS — Z7982 Long term (current) use of aspirin: Secondary | ICD-10-CM | POA: Insufficient documentation

## 2012-01-30 DIAGNOSIS — IMO0002 Reserved for concepts with insufficient information to code with codable children: Secondary | ICD-10-CM | POA: Insufficient documentation

## 2012-01-30 DIAGNOSIS — Z79899 Other long term (current) drug therapy: Secondary | ICD-10-CM | POA: Insufficient documentation

## 2012-01-30 DIAGNOSIS — J45909 Unspecified asthma, uncomplicated: Secondary | ICD-10-CM | POA: Insufficient documentation

## 2012-01-30 DIAGNOSIS — I10 Essential (primary) hypertension: Secondary | ICD-10-CM | POA: Insufficient documentation

## 2012-01-30 DIAGNOSIS — Z87891 Personal history of nicotine dependence: Secondary | ICD-10-CM | POA: Insufficient documentation

## 2012-01-30 DIAGNOSIS — Z8614 Personal history of Methicillin resistant Staphylococcus aureus infection: Secondary | ICD-10-CM | POA: Insufficient documentation

## 2012-01-30 DIAGNOSIS — M129 Arthropathy, unspecified: Secondary | ICD-10-CM | POA: Insufficient documentation

## 2012-01-30 DIAGNOSIS — L0390 Cellulitis, unspecified: Secondary | ICD-10-CM

## 2012-01-30 DIAGNOSIS — E785 Hyperlipidemia, unspecified: Secondary | ICD-10-CM | POA: Insufficient documentation

## 2012-01-30 HISTORY — DX: Methicillin resistant Staphylococcus aureus infection, unspecified site: A49.02

## 2012-01-30 LAB — POCT I-STAT, CHEM 8
Hemoglobin: 12.2 g/dL (ref 12.0–15.0)
Potassium: 3.9 mEq/L (ref 3.5–5.1)
Sodium: 143 mEq/L (ref 135–145)
TCO2: 27 mmol/L (ref 0–100)

## 2012-01-30 LAB — CBC
HCT: 36.8 % (ref 36.0–46.0)
MCH: 29.8 pg (ref 26.0–34.0)
MCHC: 32.3 g/dL (ref 30.0–36.0)
MCV: 92 fL (ref 78.0–100.0)
RDW: 15 % (ref 11.5–15.5)

## 2012-01-30 MED ORDER — DIPHENHYDRAMINE HCL 25 MG PO CAPS
25.0000 mg | ORAL_CAPSULE | Freq: Once | ORAL | Status: AC
  Administered 2012-01-30: 25 mg via ORAL
  Filled 2012-01-30 (×2): qty 1

## 2012-01-30 MED ORDER — MORPHINE SULFATE 4 MG/ML IJ SOLN: 6.0000 mg | Freq: Once | INTRAMUSCULAR | Status: DC

## 2012-01-30 MED ORDER — CLINDAMYCIN PHOSPHATE 900 MG/50ML IV SOLN
900.0000 mg | Freq: Once | INTRAVENOUS | Status: AC
  Administered 2012-01-30: 900 mg via INTRAVENOUS
  Filled 2012-01-30: qty 50

## 2012-01-30 MED ORDER — KETOROLAC TROMETHAMINE 30 MG/ML IJ SOLN
30.0000 mg | Freq: Once | INTRAMUSCULAR | Status: AC
  Administered 2012-01-30: 30 mg via INTRAMUSCULAR
  Filled 2012-01-30: qty 1

## 2012-01-30 MED ORDER — CLINDAMYCIN HCL 150 MG PO CAPS: 300.0000 mg | ORAL_CAPSULE | Freq: Four times a day (QID) | ORAL | Status: DC

## 2012-01-30 MED ORDER — METOCLOPRAMIDE HCL 10 MG PO TABS
10.0000 mg | ORAL_TABLET | Freq: Once | ORAL | Status: AC
  Administered 2012-01-30: 10 mg via ORAL
  Filled 2012-01-30: qty 1

## 2012-01-30 MED ORDER — LIDOCAINE HCL 2 % IJ SOLN
10.0000 mL | Freq: Once | INTRAMUSCULAR | Status: AC
  Administered 2012-01-30: 400 mg via INTRADERMAL
  Filled 2012-01-30: qty 20

## 2012-01-30 MED ORDER — MORPHINE SULFATE 4 MG/ML IJ SOLN
6.0000 mg | Freq: Once | INTRAMUSCULAR | Status: AC
  Administered 2012-01-30: 6 mg via INTRAMUSCULAR
  Filled 2012-01-30: qty 2

## 2012-01-30 MED ORDER — IBUPROFEN 600 MG PO TABS: 600.0000 mg | ORAL_TABLET | Freq: Four times a day (QID) | ORAL | Status: DC | PRN

## 2012-01-30 MED ORDER — OXYCODONE-ACETAMINOPHEN 5-325 MG PO TABS: 2.0000 | ORAL_TABLET | ORAL | Status: DC | PRN

## 2012-01-30 NOTE — ED Notes (Signed)
To ED via [facility]-- called to home for c/o h/a, had high BP this am when checked at [facility]. Also c/o bilateral knee pain. Regularly uses an electric w/c, also c/o left side pain, stomach pain, denies nausea/vomiting, but having diarrhea. Recently seen by regular dr. Lasix increased to bid, ankle edema bilateral, not increased from normal.

## 2012-01-30 NOTE — ED Notes (Signed)
[facility] called for transport home, pt unable to ambulate and Hover round wheel broken, pt placed in waiting room in wheelchair, NP aware.

## 2012-01-31 NOTE — ED Provider Notes (Signed)
Medical screening examination/treatment/procedure(s) were performed by non-physician practitioner and as supervising physician I was immediately available for consultation/collaboration.  Barbara Cower, MD 01/31/12 [phone]

## 2012-01-31 NOTE — ED Provider Notes (Signed)
History     CSN: [id number]  Arrival date & time 01/30/12  1509   First MD Initiated Contact with Patient 01/30/12 1547      Chief Complaint  Patient presents with  . Headache  . Knee Pain  . abscess left elbow   . abscess right axilla     (Consider location/radiation/quality/duration/timing/severity/associated sxs/prior treatment) Patient is a 46 y.o. female presenting with headaches and knee pain. The history is provided by the patient and the EMS personnel. No language interpreter was used.  Headache  This is a recurrent problem. The problem occurs constantly. The problem has not changed since onset.The headache is associated with bright light. The pain is located in the bilateral region. The quality of the pain is described as throbbing. The pain is at a severity of 8/10. The pain is moderate. The pain does not radiate. Associated symptoms include nausea. Pertinent negatives include no fever, no near-syncope, no syncope, no shortness of breath and no vomiting. She has tried nothing for the symptoms.  Knee Pain This is a chronic problem. The current episode started in the past 7 days. The problem has been unchanged. Associated symptoms include arthralgias, headaches and nausea. Pertinent negatives include no chest pain, fever, joint swelling, numbness, rash (LUE pain), vomiting or weakness. The symptoms are aggravated by walking. She has tried nothing for the symptoms.  46 yo female well known to the ER morbid obesity her via EMS with multiple complaints including bilaterel knee pain that is chronic, L elbow pain/abscess, R axilla abscess, migraine h/a and LE swelling, diarrhea with lower abdominal cramping.    Concerned about the swelling to her L elbow especially, hypertension.   No swelling noted to bilateral knees.  Seen by her pcp for LE edema and increased her lasix.  Concerned that she is dehydrated as well.  PMH listed below.    Past Medical History  Diagnosis Date  . Asthma     . GERD (gastroesophageal reflux disease)   . Hypertension   . Homelessness   . Low back pain   . 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 Arthritis   . Gout   . Hyperlipemia   . Morbid obesity     uses motor wheel chair  . MRSA (methicillin resistant Staphylococcus aureus)     states about a year ago    Past Surgical History  Procedure Date  . Tubal ligation   . Cesarean section     Family History  Problem Relation Age of Onset  . Asthma Mother   . Diabetes Father   . Heart disease Father   . Stroke Father     History  Substance Use Topics  . Smoking status: Former Smoker    Types: Cigarettes    Quit date: 03/25/2003  . Smokeless tobacco: Former Systems developer    Quit date: 05/27/1978  . Alcohol Use: No    OB History    Grav Para Term Preterm Abortions TAB SAB Ect Mult Living                  Review of Systems  Constitutional: Negative.  Negative for fever.  Eyes: Negative.   Respiratory: Negative.  Negative for shortness of breath and wheezing.   Cardiovascular: Positive for leg swelling. Negative for chest pain, syncope and near-syncope.  Gastrointestinal: Positive for nausea. Negative for vomiting and abdominal distention.       Lower abdominal cramping.  Musculoskeletal: Positive for arthralgias.  Negative for joint swelling.       L elbow tenderness  Skin: Negative for rash (LUE pain).  Neurological: Positive for headaches. Negative for dizziness, weakness and numbness.  Psychiatric/Behavioral: Negative.   All other systems reviewed and are negative.    Allergies  Azithromycin; Doxycycline; Ibuprofen; and Sulfamethoxazole w-trimethoprim  Home Medications   Current Outpatient Rx  Name  Route  Sig  Dispense  Refill  . ALBUTEROL SULFATE HFA 108 (90 BASE) MCG/ACT IN AERS   Inhalation   Inhale 2 puffs into the lungs every 4 (four) hours as needed. For asthma relief         . ALBUTEROL SULFATE (2.5 MG/3ML) 0.083% IN NEBU   Nebulization    Take 3 mLs (2.5 mg total) by nebulization every 6 (six) hours as needed for wheezing.   75 mL   12   . ALPRAZOLAM 1 MG PO TABS   Oral   Take 1 tablet (1 mg total) by mouth 2 (two) times daily.   10 tablet   0   . AMITRIPTYLINE HCL 75 MG PO TABS   Oral   Take 75 mg by mouth at bedtime.         . AMMONIUM LACTATE 12 % EX CREA   Topical   Apply 1 g topically as needed. Applies to back and legs.Marland KitchenMarland KitchenIn a thin layer on affected area for dry patches.         . ASPIRIN 325 MG PO TABS   Oral   Take 325 mg by mouth daily.         . ATORVASTATIN CALCIUM 20 MG PO TABS   Oral   Take 20 mg by mouth daily.         [name] CLONIDINE HCL 0.2 MG PO TABS   Oral   Take 0.2 mg by mouth 2 (two) times daily.         [name] DIPHENHYDRAMINE HCL 25 MG PO CAPS   Oral   Take 25 mg by mouth every 6 (six) hours as needed. For itching         . EUCERIN EX LOTN   Topical   Apply topically 2 (two) times daily as needed. On affected areas for dry patches.         [name] FAMOTIDINE 20 MG PO TABS   Oral   Take 1 tablet (20 mg total) by mouth 2 (two) times daily.   30 tablet   0   . FUROSEMIDE 40 MG PO TABS   Oral   Take 1 tablet (40 mg total) by mouth daily.   30 tablet      . HYDROCODONE-ACETAMINOPHEN 10-325 MG PO TABS   Oral   Take 1 tablet by mouth every 6 (six) hours as needed. For pain   10 tablet   0   . KETOCONAZOLE 200 MG PO TABS   Oral   Take 200 mg by mouth daily.         . LUBIPROSTONE 24 MCG PO CAPS   Oral   Take 24 mcg by mouth every evening.          . OXYCODONE-ACETAMINOPHEN 5-325 MG PO TABS   Oral   Take 2 tablets by mouth every 4 (four) hours as needed for pain.   15 tablet   0   . PANTOPRAZOLE SODIUM 40 MG PO TBEC   Oral   Take 40 mg by mouth daily.         [name] PROMETHAZINE  HCL 25 MG PO TABS   Oral   Take 25 mg by mouth every 6 (six) hours as needed. For nausea/vomiting         . SOLIFENACIN SUCCINATE 5 MG PO TABS   Oral   Take 5 mg by mouth 2 (two)  times daily.         [name] CLINDAMYCIN HCL 150 MG PO CAPS   Oral   Take 2 capsules (300 mg total) by mouth every 6 (six) hours.   28 capsule   0   . IBUPROFEN 600 MG PO TABS   Oral   Take 1 tablet (600 mg total) by mouth every 6 (six) hours as needed for pain.   30 tablet   0   . OXYCODONE-ACETAMINOPHEN 5-325 MG PO TABS   Oral   Take 2 tablets by mouth every 4 (four) hours as needed for pain.   15 tablet   0     BP 147/68  Pulse 68  Temp 98.3 F (36.8 C) (Oral)  Resp 18  SpO2 94%  LMP 03/24/2009  Physical Exam  Nursing note and vitals reviewed. Constitutional: She is oriented to person, place, and time. She appears well-developed and well-nourished.       Morbid obesity  HENT:  Head: Normocephalic and atraumatic.  Eyes: Conjunctivae normal and EOM are normal. Pupils are equal, round, and reactive to light.  Neck: Normal range of motion. Neck supple.  Cardiovascular: Normal rate and regular rhythm.   Pulmonary/Chest: Effort normal and breath sounds normal. No respiratory distress. She has no wheezes.  Abdominal: Soft. Bowel sounds are normal. She exhibits no distension. There is no tenderness. There is no rebound and no guarding.  Musculoskeletal: Normal range of motion. She exhibits edema and tenderness.       L elbow hot to touch with cellulitis and abscess  1cm abscess to R axilla.  + cms to LUE no drainage from elbow on L  Neurological: She is alert and oriented to person, place, and time. She has normal reflexes.  Skin: Skin is warm and dry.  Psychiatric: She has a normal mood and affect.    ED Course  INCISION AND DRAINAGE Date/Time: 01/30/2012 4:42 AM Performed by: Julieta Bellini Authorized by: Julieta Bellini Consent: Verbal consent obtained. Written consent not obtained. Risks and benefits: risks, benefits and alternatives were discussed Consent given by: patient Patient understanding: patient states understanding of the procedure being performed Patient  identity confirmed: verbally with patient, arm band and provided demographic data Time out: Immediately prior to procedure a "time out" was called to verify the correct patient, procedure, equipment, support staff and site/side marked as required. Type: abscess Body area: upper extremity (L elbow) Anesthesia: local infiltration Local anesthetic: lidocaine 2% without epinephrine Patient sedated: no Scalpel size: 11 Needle gauge: 22 Incision type: single straight Complexity: simple Drainage: bloody Drainage amount: scant Wound treatment: wound left open Patient tolerance: Patient tolerated the procedure well with no immediate complications.   (including critical care time)  Labs Reviewed  CBC - Abnormal; Notable for the following:    WBC 12.1 (*)     Hemoglobin 11.9 (*)     All other components within normal limits  POCT I-STAT, CHEM 8 - Abnormal; Notable for the following:    Calcium, Ion 1.11 (*)     All other components within normal limits  LAB REPORT - SCANNED   No results found.   1. Cellulitis       MDM  46 yo morbidly obese female with multiple complaints.  L elbow cellulitis treated with clindamycin 900mg  IV in the ER.  Migraine cocktail with relief.  No LE edema or SOB today.  rx for percocet and clindamycin.  Recheck in 2 days or sooner if worsening symptoms.  Transported back via ems. WBC 12.1 afebrile.   Labs Reviewed  CBC - Abnormal; Notable for the following:    WBC 12.1 (*)     Hemoglobin 11.9 (*)     All other components within normal limits  POCT I-STAT, CHEM 8 - Abnormal; Notable for the following:    Calcium, Ion 1.11 (*)     All other components within normal limits  LAB REPORT - SCANNED          Julieta Bellini, NP 01/31/12 1246

## 2012-03-11 ENCOUNTER — Emergency Department (HOSPITAL_COMMUNITY)
Admission: EM | Admit: 2012-03-11 | Discharge: 2012-03-12 | Disposition: A | Discharge status: Home / Self Care | Payer: Medicaid Other | Attending: Emergency Medicine | Admitting: Emergency Medicine

## 2012-03-11 ENCOUNTER — Encounter (HOSPITAL_COMMUNITY): Payer: Self-pay | Admitting: Family Medicine

## 2012-03-11 DIAGNOSIS — Z79899 Other long term (current) drug therapy: Secondary | ICD-10-CM | POA: Insufficient documentation

## 2012-03-11 DIAGNOSIS — J45909 Unspecified asthma, uncomplicated: Secondary | ICD-10-CM

## 2012-03-11 DIAGNOSIS — Z7982 Long term (current) use of aspirin: Secondary | ICD-10-CM | POA: Insufficient documentation

## 2012-03-11 DIAGNOSIS — J159 Unspecified bacterial pneumonia: Secondary | ICD-10-CM | POA: Insufficient documentation

## 2012-03-11 DIAGNOSIS — I10 Essential (primary) hypertension: Secondary | ICD-10-CM | POA: Insufficient documentation

## 2012-03-11 DIAGNOSIS — Z87891 Personal history of nicotine dependence: Secondary | ICD-10-CM | POA: Insufficient documentation

## 2012-03-11 DIAGNOSIS — Z8614 Personal history of Methicillin resistant Staphylococcus aureus infection: Secondary | ICD-10-CM | POA: Insufficient documentation

## 2012-03-11 DIAGNOSIS — Z5900 Homelessness unspecified: Secondary | ICD-10-CM | POA: Insufficient documentation

## 2012-03-11 DIAGNOSIS — Z8639 Personal history of other endocrine, nutritional and metabolic disease: Secondary | ICD-10-CM | POA: Insufficient documentation

## 2012-03-11 DIAGNOSIS — E785 Hyperlipidemia, unspecified: Secondary | ICD-10-CM | POA: Insufficient documentation

## 2012-03-11 DIAGNOSIS — J45901 Unspecified asthma with (acute) exacerbation: Secondary | ICD-10-CM | POA: Insufficient documentation

## 2012-03-11 DIAGNOSIS — Z8739 Personal history of other diseases of the musculoskeletal system and connective tissue: Secondary | ICD-10-CM | POA: Insufficient documentation

## 2012-03-11 DIAGNOSIS — J189 Pneumonia, unspecified organism: Secondary | ICD-10-CM

## 2012-03-11 DIAGNOSIS — K219 Gastro-esophageal reflux disease without esophagitis: Secondary | ICD-10-CM | POA: Insufficient documentation

## 2012-03-11 DIAGNOSIS — Z862 Personal history of diseases of the blood and blood-forming organs and certain disorders involving the immune mechanism: Secondary | ICD-10-CM | POA: Insufficient documentation

## 2012-03-11 DIAGNOSIS — R0789 Other chest pain: Secondary | ICD-10-CM | POA: Insufficient documentation

## 2012-03-11 DIAGNOSIS — R51 Headache: Secondary | ICD-10-CM | POA: Insufficient documentation

## 2012-03-11 NOTE — ED Notes (Signed)
Patient states she has had shortness of breath x 3 days. Has used inhaler and neb machine multiple times today that she now has a headache from it. Also reports swelling to lower extremities. States she has been itching for the past 2-3 days.

## 2012-03-12 ENCOUNTER — Emergency Department (HOSPITAL_COMMUNITY): Payer: Medicaid Other

## 2012-03-12 LAB — CBC
Platelets: 291 10*3/uL (ref 150–400)
RBC: 4.03 MIL/uL (ref 3.87–5.11)
WBC: 9.8 10*3/uL (ref 4.0–10.5)

## 2012-03-12 LAB — BASIC METABOLIC PANEL
CO2: 32 mEq/L (ref 19–32)
Chloride: 101 mEq/L (ref 96–112)
Potassium: 3.6 mEq/L (ref 3.5–5.1)
Sodium: 141 mEq/L (ref 135–145)

## 2012-03-12 LAB — TROPONIN I: Troponin I: <0.3 ng/mL (ref <0.30)

## 2012-03-12 MED ORDER — OXYCODONE-ACETAMINOPHEN 5-325 MG PO TABS
1.0000 | ORAL_TABLET | Freq: Once | ORAL | Status: AC
  Administered 2012-03-12: 1 via ORAL
  Filled 2012-03-12: qty 1

## 2012-03-12 MED ORDER — IPRATROPIUM BROMIDE 0.02 % IN SOLN
0.5000 mg | Freq: Once | RESPIRATORY_TRACT | Status: AC
  Administered 2012-03-12: 0.5 mg via RESPIRATORY_TRACT
  Filled 2012-03-12: qty 2.5

## 2012-03-12 MED ORDER — ALBUTEROL SULFATE (5 MG/ML) 0.5% IN NEBU
5.0000 mg | INHALATION_SOLUTION | Freq: Once | RESPIRATORY_TRACT | Status: AC
  Administered 2012-03-12: 5 mg via RESPIRATORY_TRACT
  Filled 2012-03-12: qty 1

## 2012-03-12 MED ORDER — MORPHINE SULFATE 4 MG/ML IJ SOLN
4.0000 mg | Freq: Once | INTRAMUSCULAR | Status: AC
  Administered 2012-03-12: 4 mg via INTRAVENOUS
  Filled 2012-03-12: qty 1

## 2012-03-12 MED ORDER — CLARITHROMYCIN 500 MG PO TABS: 500.0000 mg | ORAL_TABLET | Freq: Two times a day (BID) | ORAL | Status: DC

## 2012-03-12 MED ORDER — METOCLOPRAMIDE HCL 5 MG/ML IJ SOLN
10.0000 mg | Freq: Once | INTRAMUSCULAR | Status: AC
  Administered 2012-03-12: 10 mg via INTRAVENOUS
  Filled 2012-03-12: qty 2

## 2012-03-12 MED ORDER — DIPHENHYDRAMINE HCL 50 MG/ML IJ SOLN
25.0000 mg | Freq: Once | INTRAMUSCULAR | Status: AC
  Administered 2012-03-12: 25 mg via INTRAVENOUS
  Filled 2012-03-12: qty 1

## 2012-03-12 MED ORDER — OXYCODONE-ACETAMINOPHEN 5-325 MG PO TABS: 1.0000 | ORAL_TABLET | ORAL | Status: DC | PRN

## 2012-03-12 NOTE — ED Notes (Signed)
PA at bedside.

## 2012-03-12 NOTE — ED Notes (Addendum)
Pt desat to 89%.  Pt put on 2L [place].  Pt also thinks she needs another breathing tx.  Pt's O2 is now 94%

## 2012-03-12 NOTE — ED Provider Notes (Signed)
History     CSN: [id number]  Arrival date & time 03/11/12  2317   First MD Initiated Contact with Patient 03/12/12 0048      Chief Complaint  Patient presents with  . Shortness of Breath    (Consider location/radiation/quality/duration/timing/severity/associated sxs/prior treatment) HPI History provided by pt.   Pt presents w/ multiple complaints.  For the past three days, she has had intermittent, sharp and tight pain in center of chest w/ radiation to RUE and RLE.  Non-exertional and lasts for approx 1 hour.  Occasionally alleviated by breathing treatment.  Associated w/ SOB.  H/o asthma and these sx similar w/ exception that pain is more sharp.  Has had a productive cough recently but no known fever.  Has worse than baseline bilateral LE edema, despite compliance w/ lasix and elevation.  Also c/o one week of paresthesias in all four extremities.  Around that same time, she developed non-traumatic frontal headaches.  H/o similar headache but this time they are more severe.  She also had some difficulty swallowing her foods and feels lightheaded when she stands up.   Past Medical History  Diagnosis Date  . Asthma   . GERD (gastroesophageal reflux disease)   . Hypertension   . Homelessness   . Low back pain   . 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 Arthritis   . Gout   . Hyperlipemia   . Morbid obesity     uses motor wheel chair  . MRSA (methicillin resistant Staphylococcus aureus)     states about a year ago    Past Surgical History  Procedure Date  . Tubal ligation   . Cesarean section     Family History  Problem Relation Age of Onset  . Asthma Mother   . Diabetes Father   . Heart disease Father   . Stroke Father     History  Substance Use Topics  . Smoking status: Former Smoker    Types: Cigarettes    Quit date: 03/25/2003  . Smokeless tobacco: Former Systems developer    Quit date: 05/27/1978  . Alcohol Use: No    OB History    Grav Para Term Preterm  Abortions TAB SAB Ect Mult Living                  Review of Systems  All other systems reviewed and are negative.    Allergies  Azithromycin; Doxycycline; Ibuprofen; and Sulfamethoxazole w-trimethoprim  Home Medications   Current Outpatient Rx  Name  Route  Sig  Dispense  Refill  . ALBUTEROL SULFATE HFA 108 (90 BASE) MCG/ACT IN AERS   Inhalation   Inhale 2 puffs into the lungs every 4 (four) hours as needed. For asthma relief         . ALBUTEROL SULFATE (2.5 MG/3ML) 0.083% IN NEBU   Nebulization   Take 3 mLs (2.5 mg total) by nebulization every 6 (six) hours as needed for wheezing.   75 mL   12   . ALPRAZOLAM 1 MG PO TABS   Oral   Take 1 tablet (1 mg total) by mouth 2 (two) times daily.   10 tablet   0   . AMITRIPTYLINE HCL 75 MG PO TABS   Oral   Take 75 mg by mouth at bedtime.         . AMMONIUM LACTATE 12 % EX CREA   Topical   Apply 1 g topically as needed. Applies to  back and legs.Marland KitchenMarland KitchenIn a thin layer on affected area for dry patches.         . ASPIRIN 325 MG PO TABS   Oral   Take 325 mg by mouth daily.         . ATORVASTATIN CALCIUM 20 MG PO TABS   Oral   Take 20 mg by mouth daily.         [name] CLINDAMYCIN HCL 150 MG PO CAPS   Oral   Take 2 capsules (300 mg total) by mouth every 6 (six) hours.   28 capsule   0   . CLONIDINE HCL 0.2 MG PO TABS   Oral   Take 0.2 mg by mouth 2 (two) times daily.         [name] DIPHENHYDRAMINE HCL 25 MG PO CAPS   Oral   Take 25 mg by mouth every 6 (six) hours as needed. For itching         . EUCERIN EX LOTN   Topical   Apply topically 2 (two) times daily as needed. On affected areas for dry patches.         [name] FAMOTIDINE 20 MG PO TABS   Oral   Take 1 tablet (20 mg total) by mouth 2 (two) times daily.   30 tablet   0   . FUROSEMIDE 40 MG PO TABS   Oral   Take 1 tablet (40 mg total) by mouth daily.   30 tablet      . HYDROCODONE-ACETAMINOPHEN 10-325 MG PO TABS   Oral   Take 1 tablet by mouth every 6  (six) hours as needed. For pain   10 tablet   0   . IBUPROFEN 600 MG PO TABS   Oral   Take 1 tablet (600 mg total) by mouth every 6 (six) hours as needed for pain.   30 tablet   0   . KETOCONAZOLE 200 MG PO TABS   Oral   Take 200 mg by mouth daily.         . LUBIPROSTONE 24 MCG PO CAPS   Oral   Take 24 mcg by mouth every evening.          . OXYCODONE-ACETAMINOPHEN 5-325 MG PO TABS   Oral   Take 2 tablets by mouth every 4 (four) hours as needed for pain.   15 tablet   0   . OXYCODONE-ACETAMINOPHEN 5-325 MG PO TABS   Oral   Take 2 tablets by mouth every 4 (four) hours as needed for pain.   15 tablet   0   . PANTOPRAZOLE SODIUM 40 MG PO TBEC   Oral   Take 40 mg by mouth daily.         [name] PROMETHAZINE HCL 25 MG PO TABS   Oral   Take 25 mg by mouth every 6 (six) hours as needed. For nausea/vomiting         . SOLIFENACIN SUCCINATE 5 MG PO TABS   Oral   Take 5 mg by mouth 2 (two) times daily.           BP 116/65  Pulse 83  Temp 98.6 F (37 C) (Oral)  Resp 22  SpO2 95%  LMP 03/24/2009  Physical Exam  Nursing note and vitals reviewed. Constitutional: She is oriented to person, place, and time. She appears well-developed and well-nourished. No distress.       Morbidly obese  HENT:  Head: Normocephalic and atraumatic.  Eyes:  Normal appearance  Neck: Normal range of motion.  Cardiovascular: Normal rate, regular rhythm and intact distal pulses.   Pulmonary/Chest: Effort normal. No respiratory distress. She exhibits no tenderness.       No pleuritic pain reported.  Breath sounds diminished.   Musculoskeletal: Normal range of motion.       No peripheral edema or calf tenderness  Neurological: She is alert and oriented to person, place, and time.  Skin: Skin is warm and dry. No rash noted.  Psychiatric: She has a normal mood and affect. Her behavior is normal.    ED Course  Procedures (including critical care time)   Date: 03/12/2012  Rate:  69Rhythm: normal sinus rhythm  QRS Axis: normal  Intervals: normal  ST/T Wave abnormalities: normal  Conduction Disutrbances:none  Narrative Interpretation:   Old EKG Reviewed: unchanged   Labs Reviewed  CBC - Abnormal; Notable for the following:    Hemoglobin 11.8 (*)     All other components within normal limits  BASIC METABOLIC PANEL   Dg Chest 2 View  03/12/2012  *RADIOLOGY REPORT*  Clinical Data: Shortness of breath, cough  CHEST - 2 VIEW  Comparison: 09/07/2011  Findings: Mild patchy right upper lobe opacity, possibly reflecting pneumonia. No pleural effusion or pneumothorax.  Cardiomegaly.  Degenerative changes of the visualized thoracolumbar spine.  IMPRESSION: Mild patchy right upper lobe opacity, possibly reflecting pneumonia.   Original Report Authenticated By: Julian Hy, M.D.      1. Community acquired pneumonia   2. Asthma   3. Headache       MDM  Morbidly obese 46yo F w/ h/o asthma presents w/ multiple complaints.  Has had intermittent CP x 3 days.  Increased home O2 requirements. Coughing and wheezing. Doubt ACS; pain atypical, EKG non-ischemic, troponin neg, CXR shows R upper lobe pneumonia and sx improved in ED w/ nebulizer treatment.  Pt also c/o pain on top of her head.  Non-traumatic and has had similar headaches in the past.  Well-appearing and no fever,meningeal signs or focal neuro deficits on exam.  Pain improved w/ IV reglan and morphine.  Also c/o diffuse extremity paresthesias, lightheadedness upon standing, difficulty swallowing foods.  She is tolerating fluids and sandwich in ED.  Recommended close f/u with her PCP for all of these symptoms.         Remer Macho, PA-C 03/12/12 [phone]

## 2012-03-12 NOTE — ED Notes (Signed)
Pt able to move from bed to motorized scooter with no trouble.

## 2012-03-13 ENCOUNTER — Emergency Department (HOSPITAL_COMMUNITY)
Admission: EM | Admit: 2012-03-13 | Discharge: 2012-03-14 | Disposition: A | Discharge status: Home / Self Care | Payer: Medicaid Other | Attending: Emergency Medicine | Admitting: Emergency Medicine

## 2012-03-13 DIAGNOSIS — IMO0002 Reserved for concepts with insufficient information to code with codable children: Secondary | ICD-10-CM | POA: Insufficient documentation

## 2012-03-13 DIAGNOSIS — E785 Hyperlipidemia, unspecified: Secondary | ICD-10-CM | POA: Insufficient documentation

## 2012-03-13 DIAGNOSIS — R062 Wheezing: Secondary | ICD-10-CM | POA: Insufficient documentation

## 2012-03-13 DIAGNOSIS — J45909 Unspecified asthma, uncomplicated: Secondary | ICD-10-CM | POA: Insufficient documentation

## 2012-03-13 DIAGNOSIS — Z8719 Personal history of other diseases of the digestive system: Secondary | ICD-10-CM | POA: Insufficient documentation

## 2012-03-13 DIAGNOSIS — Z7982 Long term (current) use of aspirin: Secondary | ICD-10-CM | POA: Insufficient documentation

## 2012-03-13 DIAGNOSIS — I10 Essential (primary) hypertension: Secondary | ICD-10-CM | POA: Insufficient documentation

## 2012-03-13 DIAGNOSIS — Z8614 Personal history of Methicillin resistant Staphylococcus aureus infection: Secondary | ICD-10-CM | POA: Insufficient documentation

## 2012-03-13 DIAGNOSIS — J189 Pneumonia, unspecified organism: Secondary | ICD-10-CM | POA: Insufficient documentation

## 2012-03-13 DIAGNOSIS — Z8739 Personal history of other diseases of the musculoskeletal system and connective tissue: Secondary | ICD-10-CM | POA: Insufficient documentation

## 2012-03-13 DIAGNOSIS — Z87891 Personal history of nicotine dependence: Secondary | ICD-10-CM | POA: Insufficient documentation

## 2012-03-13 DIAGNOSIS — Z79899 Other long term (current) drug therapy: Secondary | ICD-10-CM | POA: Insufficient documentation

## 2012-03-13 NOTE — ED Notes (Signed)
Pt presents to ED with a complaint of shortness of breath.  Pt states she has asthma and has "been taking inhalers all day."  Pt also complains of a headache.  Pt states" I have pneumonia."

## 2012-03-14 ENCOUNTER — Emergency Department (HOSPITAL_COMMUNITY): Payer: Medicaid Other

## 2012-03-14 ENCOUNTER — Encounter (HOSPITAL_COMMUNITY): Payer: Self-pay | Admitting: Emergency Medicine

## 2012-03-14 LAB — CBC WITH DIFFERENTIAL/PLATELET
Basophils Absolute: 0 10*3/uL (ref 0.0–0.1)
Basophils Relative: 0 % (ref 0–1)
Eosinophils Absolute: 0.3 10*3/uL (ref 0.0–0.7)
Eosinophils Relative: 3 % (ref 0–5)
HCT: 37.2 % (ref 36.0–46.0)
Hemoglobin: 11.7 g/dL — ABNORMAL LOW (ref 12.0–15.0)
Lymphocytes Relative: 20 % (ref 12–46)
Lymphs Abs: 1.9 10*3/uL (ref 0.7–4.0)
MCH: 29.5 pg (ref 26.0–34.0)
MCHC: 31.5 g/dL (ref 30.0–36.0)
MCV: 93.7 fL (ref 78.0–100.0)
Monocytes Absolute: 0.7 10*3/uL (ref 0.1–1.0)
Monocytes Relative: 7 % (ref 3–12)
Neutro Abs: 6.6 10*3/uL (ref 1.7–7.7)
Neutrophils Relative %: 70 % (ref 43–77)
Platelets: 279 10*3/uL (ref 150–400)
RBC: 3.97 MIL/uL (ref 3.87–5.11)
RDW: 14.6 % (ref 11.5–15.5)
WBC: 9.5 10*3/uL (ref 4.0–10.5)

## 2012-03-14 LAB — BASIC METABOLIC PANEL
BUN: 15 mg/dL (ref 6–23)
CO2: 30 mEq/L (ref 19–32)
Calcium: 8.6 mg/dL (ref 8.4–10.5)
Chloride: 99 mEq/L (ref 96–112)
Creatinine, Ser: 1.06 mg/dL (ref 0.50–1.10)
GFR calc Af Amer: 71 mL/min — ABNORMAL LOW (ref >90)
GFR calc non Af Amer: 62 mL/min — ABNORMAL LOW (ref >90)
Glucose, Bld: 105 mg/dL — ABNORMAL HIGH (ref 70–99)
Potassium: 4.1 mEq/L (ref 3.5–5.1)
Sodium: 138 mEq/L (ref 135–145)

## 2012-03-14 MED ORDER — IOHEXOL 300 MG/ML  SOLN
100.0000 mL | Freq: Once | INTRAMUSCULAR | Status: AC | PRN
  Administered 2012-03-14: 100 mL via INTRAVENOUS

## 2012-03-14 MED ORDER — HYDROCODONE-ACETAMINOPHEN 5-325 MG PO TABS
1.0000 | ORAL_TABLET | Freq: Once | ORAL | Status: AC
  Administered 2012-03-14: 1 via ORAL
  Filled 2012-03-14: qty 1

## 2012-03-14 MED ORDER — IPRATROPIUM BROMIDE 0.02 % IN SOLN
0.5000 mg | Freq: Once | RESPIRATORY_TRACT | Status: AC
Start: 2012-03-14 — End: 2012-03-14
  Administered 2012-03-14: 0.5 mg via RESPIRATORY_TRACT
  Filled 2012-03-14: qty 2.5

## 2012-03-14 MED ORDER — DIPHENHYDRAMINE HCL 25 MG PO CAPS
25.0000 mg | ORAL_CAPSULE | Freq: Once | ORAL | Status: AC
  Administered 2012-03-14: 25 mg via ORAL
  Filled 2012-03-14: qty 1

## 2012-03-14 MED ORDER — LEVOFLOXACIN 500 MG PO TABS: 500.0000 mg | ORAL_TABLET | Freq: Every day | ORAL | Status: DC

## 2012-03-14 MED ORDER — ALBUTEROL SULFATE (5 MG/ML) 0.5% IN NEBU
2.5000 mg | INHALATION_SOLUTION | Freq: Once | RESPIRATORY_TRACT | Status: AC
  Administered 2012-03-14: 2.5 mg via RESPIRATORY_TRACT
  Filled 2012-03-14: qty 0.5

## 2012-03-14 MED ORDER — LEVOFLOXACIN IN D5W 750 MG/150ML IV SOLN
750.0000 mg | Freq: Once | INTRAVENOUS | Status: AC
  Administered 2012-03-14: 750 mg via INTRAVENOUS
  Filled 2012-03-14: qty 150

## 2012-03-14 NOTE — ED Provider Notes (Signed)
Medical screening examination/treatment/procedure(s) were conducted as a shared visit with non-physician practitioner(s) and myself.  I personally evaluated the patient during the encounter   The patient appears to have pneumonia on her CT scan.  She's been given a dose of Levaquin in the emergency department.  She'll be discharged home on Levaquin.  Her vital signs are normal.  Her respiratory rate is normal.  Her pulse ox is 96% on room air without increased work of breathing.  I believe  the patient is safe be treated as an outpatient.  I told her to return the emergency department for new or worsening symptoms  Dg Chest 2 View  03/14/2012  *RADIOLOGY REPORT*  Clinical Data: Cough and congestion.  CHEST - 2 VIEW  Comparison: 03/12/2012  Findings: Two views of the chest again demonstrate low lung volumes.  There are persistent densities in the right hilum and right lung base.  Heart size remains enlarged.  Trachea is midline. Bony thorax is intact.  IMPRESSION: Minimal change since the previous examination.  Again noted are densities in the right hilum and right lower lung region. Findings could be related to areas of volume loss.  An infectious etiology cannot be excluded.   Original Report Authenticated By: Markus Daft, M.D.    Dg Chest 2 View  03/12/2012  *RADIOLOGY REPORT*  Clinical Data: Shortness of breath, cough  CHEST - 2 VIEW  Comparison: 09/07/2011  Findings: Mild patchy right upper lobe opacity, possibly reflecting pneumonia. No pleural effusion or pneumothorax.  Cardiomegaly.  Degenerative changes of the visualized thoracolumbar spine.  IMPRESSION: Mild patchy right upper lobe opacity, possibly reflecting pneumonia.   Original Report Authenticated By: Julian Hy, M.D.    Ct Chest W Contrast  03/14/2012  *RADIOLOGY REPORT*  Clinical Data: Shortness of breath  CT CHEST WITH CONTRAST  Technique:  Multidetector CT imaging of the chest was performed following the standard protocol during  bolus administration of intravenous contrast.  Contrast: 133mL OMNIPAQUE IOHEXOL 300 MG/ML  SOLN  Comparison: 01/15/2011  Findings: Degraded by patient body habitus and respiratory motion.  Right upper and middle lobe airspace opacity and to a lesser extent, lingula. No pneumothorax.  Normal caliber aorta.  No main branch pulmonary arterial filling defect.  This exam is not optimized to evaluate the pulmonary vasculature.  Normal heart size.  No pleural or pericardial effusion. Mildly prominent right paratracheal and right hilar lymph nodes.  Limited images through the upper abdomen show no acute finding.  No acute osseous finding, multilevel degenerative change.  IMPRESSION: Right upper and middle lobe airspace opacities and to a lesser extent lingular opacity, atelectasis versus pneumonia.  Recommend radiograph follow-up after therapy to document resolution.   Original Report Authenticated By: Carlos Levering, M.D.   I personally reviewed the imaging tests through PACS system I reviewed available ER/hospitalization records through the [facility], MD 03/14/12 [phone]

## 2012-03-14 NOTE — ED Provider Notes (Signed)
History     CSN: [id number]  Arrival date & time 03/13/12  2349   First MD Initiated Contact with Patient 03/14/12 0049      Chief Complaint  Patient presents with  . Shortness of Breath  . Headache    (Consider location/radiation/quality/duration/timing/severity/associated sxs/prior treatment) HPI Patient presents emergency department with cough and wheezing.  Patient, states that she was here 2 days ago, and diagnosed with pneumonia, but did not get the antibiotics.  Due to the fact, she felt she was allergic to them.  Patient denies chest pain, vomiting, weakness, headache, diarrhea, abdominal pain, nausea, or fever.  Patient, states she did not take anything else for her symptoms. Past Medical History  Diagnosis Date  . Asthma   . GERD (gastroesophageal reflux disease)   . Hypertension   . Homelessness   . Low back pain   . 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 Arthritis   . Gout   . Hyperlipemia   . Morbid obesity     uses motor wheel chair  . MRSA (methicillin resistant Staphylococcus aureus)     states about a year ago    Past Surgical History  Procedure Date  . Tubal ligation   . Cesarean section     Family History  Problem Relation Age of Onset  . Asthma Mother   . Diabetes Father   . Heart disease Father   . Stroke Father     History  Substance Use Topics  . Smoking status: Former Smoker    Types: Cigarettes    Quit date: 03/25/2003  . Smokeless tobacco: Former Systems developer    Quit date: 05/27/1978  . Alcohol Use: No    OB History    Grav Para Term Preterm Abortions TAB SAB Ect Mult Living                  Review of Systems All other systems negative except as documented in the HPI. All pertinent positives and negatives as reviewed in the HPI.  Allergies  Ibuprofen; Adhesive; Azithromycin; Doxycycline; and Sulfamethoxazole w-trimethoprim  Home Medications   Current Outpatient Rx  Name  Route  Sig  Dispense  Refill  .  ALBUTEROL SULFATE HFA 108 (90 BASE) MCG/ACT IN AERS   Inhalation   Inhale 2 puffs into the lungs every 4 (four) hours as needed. For asthma relief         . ALBUTEROL SULFATE (2.5 MG/3ML) 0.083% IN NEBU   Nebulization   Take 3 mLs (2.5 mg total) by nebulization every 6 (six) hours as needed for wheezing.   75 mL   12   . AMITRIPTYLINE HCL 75 MG PO TABS   Oral   Take 75 mg by mouth at bedtime.         . ASPIRIN 325 MG PO TABS   Oral   Take 325 mg by mouth daily.         . ATORVASTATIN CALCIUM 20 MG PO TABS   Oral   Take 20 mg by mouth daily.         [name] CLARITHROMYCIN 500 MG PO TABS   Oral   Take 1 tablet (500 mg total) by mouth 2 (two) times daily.   14 tablet   0   . CLONAZEPAM 1 MG PO TABS   Oral   Take 1 mg by mouth 3 (three) times daily as needed. For anxiety         .  CLONIDINE HCL 0.2 MG PO TABS   Oral   Take 0.2 mg by mouth 2 (two) times daily.         [name] DIPHENHYDRAMINE HCL 25 MG PO CAPS   Oral   Take 25 mg by mouth every 6 (six) hours as needed. For itching         . EUCERIN EX LOTN   Topical   Apply topically 2 (two) times daily as needed. On affected areas for dry patches.         [name] FAMOTIDINE 20 MG PO TABS   Oral   Take 1 tablet (20 mg total) by mouth 2 (two) times daily.   30 tablet   0   . FUROSEMIDE 40 MG PO TABS   Oral   Take 1 tablet (40 mg total) by mouth daily.   30 tablet      . HYDROCODONE-ACETAMINOPHEN 10-325 MG PO TABS   Oral   Take 1 tablet by mouth every 6 (six) hours as needed. For pain   10 tablet   0   . KETOCONAZOLE 2 % EX CREA   Topical   Apply topically daily.         [name] KETOCONAZOLE 2 % EX SHAM   Topical   Apply 1 application topically 2 (two) times a week.         [name] KETOCONAZOLE 200 MG PO TABS   Oral   Take 200 mg by mouth daily.         . LUBIPROSTONE 24 MCG PO CAPS   Oral   Take 24 mcg by mouth every evening.          . OXYCODONE-ACETAMINOPHEN 5-325 MG PO TABS   Oral   Take 1 tablet  by mouth every 4 (four) hours as needed for pain.   20 tablet   0   . PANTOPRAZOLE SODIUM 40 MG PO TBEC   Oral   Take 40 mg by mouth daily.         [name] PROMETHAZINE HCL 25 MG PO TABS   Oral   Take 25 mg by mouth every 6 (six) hours as needed. For nausea/vomiting         . SOLIFENACIN SUCCINATE 5 MG PO TABS   Oral   Take 5 mg by mouth 2 (two) times daily.         . AMMONIUM LACTATE 12 % EX CREA   Topical   Apply 1 g topically as needed. Applies to back and legs.Marland KitchenMarland KitchenIn a thin layer on affected area for dry patches.           BP 144/83  Pulse 88  Temp 98.4 F (36.9 C) (Oral)  Resp 20  SpO2 95%  LMP 03/24/2009  Physical Exam  Nursing note and vitals reviewed. Constitutional: She is oriented to person, place, and time. She appears well-developed and well-nourished.  HENT:  Head: Normocephalic and atraumatic.  Mouth/Throat: Oropharynx is clear and moist.  Eyes: Pupils are equal, round, and reactive to light.  Neck: Normal range of motion. Neck supple.  Cardiovascular: Normal rate, regular rhythm and normal heart sounds.   Pulmonary/Chest: Effort normal. No respiratory distress. She has wheezes. She has no rales.  Neurological: She is alert and oriented to person, place, and time.  Skin: Skin is warm and dry. No rash noted.    ED Course  Procedures (including critical care time)  Labs Reviewed  CBC WITH DIFFERENTIAL - Abnormal; Notable for the following:  Hemoglobin 11.7 (*)     All other components within normal limits  BASIC METABOLIC PANEL - Abnormal; Notable for the following:    Glucose, Bld 105 (*)     GFR calc non Af Amer 62 (*)     GFR calc Af Amer 71 (*)     All other components within normal limits   Dg Chest 2 View  03/14/2012  *RADIOLOGY REPORT*  Clinical Data: Cough and congestion.  CHEST - 2 VIEW  Comparison: 03/12/2012  Findings: Two views of the chest again demonstrate low lung volumes.  There are persistent densities in the right hilum and  right lung base.  Heart size remains enlarged.  Trachea is midline. Bony thorax is intact.  IMPRESSION: Minimal change since the previous examination.  Again noted are densities in the right hilum and right lower lung region. Findings could be related to areas of volume loss.  An infectious etiology cannot be excluded.   Original Report Authenticated By: Markus Daft, M.D.     Patient's been stable here in the ER. Dr. Venora Maples will follow up with her CT scan   MDM   MDM Reviewed: nursing note, vitals and previous chart Reviewed previous: labs and x-ray Interpretation: labs, x-ray and CT scan           Brent General, PA-C 03/14/12 [id number]

## 2012-03-15 NOTE — ED Provider Notes (Signed)
Medical screening examination/treatment/procedure(s) were performed by non-physician practitioner and as supervising physician I was immediately available for consultation/collaboration.  Jasper Riling. Alvino Chapel, MD 03/15/12 1023

## 2012-03-16 ENCOUNTER — Emergency Department (HOSPITAL_COMMUNITY): Payer: Medicaid Other

## 2012-03-16 ENCOUNTER — Encounter (HOSPITAL_COMMUNITY): Payer: Self-pay | Admitting: *Deleted

## 2012-03-16 DIAGNOSIS — G8929 Other chronic pain: Secondary | ICD-10-CM | POA: Insufficient documentation

## 2012-03-16 DIAGNOSIS — J189 Pneumonia, unspecified organism: Principal | ICD-10-CM | POA: Insufficient documentation

## 2012-03-16 DIAGNOSIS — I10 Essential (primary) hypertension: Secondary | ICD-10-CM | POA: Insufficient documentation

## 2012-03-16 DIAGNOSIS — R062 Wheezing: Secondary | ICD-10-CM | POA: Insufficient documentation

## 2012-03-16 DIAGNOSIS — Z79899 Other long term (current) drug therapy: Secondary | ICD-10-CM | POA: Insufficient documentation

## 2012-03-16 DIAGNOSIS — R21 Rash and other nonspecific skin eruption: Secondary | ICD-10-CM | POA: Insufficient documentation

## 2012-03-16 DIAGNOSIS — R05 Cough: Secondary | ICD-10-CM | POA: Insufficient documentation

## 2012-03-16 DIAGNOSIS — R071 Chest pain on breathing: Secondary | ICD-10-CM | POA: Insufficient documentation

## 2012-03-16 DIAGNOSIS — J45901 Unspecified asthma with (acute) exacerbation: Secondary | ICD-10-CM | POA: Insufficient documentation

## 2012-03-16 DIAGNOSIS — R0602 Shortness of breath: Secondary | ICD-10-CM | POA: Insufficient documentation

## 2012-03-16 DIAGNOSIS — R059 Cough, unspecified: Secondary | ICD-10-CM | POA: Insufficient documentation

## 2012-03-16 MED ORDER — ALBUTEROL SULFATE (5 MG/ML) 0.5% IN NEBU
5.0000 mg | INHALATION_SOLUTION | Freq: Once | RESPIRATORY_TRACT | Status: AC
  Administered 2012-03-16: 5 mg via RESPIRATORY_TRACT
  Filled 2012-03-16: qty 1

## 2012-03-16 NOTE — ED Notes (Signed)
Pt states she was seen at [facility] on the 21 for saem syptoms. Pt was discharged with a diagnosis of pneumonia. Pt states she has been using her nebulizers and inhalers and nothing is getting Her productive cough (yellow) and SOB better.

## 2012-03-17 ENCOUNTER — Encounter (HOSPITAL_COMMUNITY): Payer: Self-pay | Admitting: *Deleted

## 2012-03-17 ENCOUNTER — Observation Stay (HOSPITAL_COMMUNITY)
Admission: EM | Admit: 2012-03-17 | Discharge: 2012-03-21 | Disposition: A | Discharge status: Home / Self Care | Payer: Medicaid Other | Attending: Internal Medicine | Admitting: Internal Medicine

## 2012-03-17 DIAGNOSIS — R112 Nausea with vomiting, unspecified: Secondary | ICD-10-CM

## 2012-03-17 DIAGNOSIS — M549 Dorsalgia, unspecified: Secondary | ICD-10-CM

## 2012-03-17 DIAGNOSIS — G8929 Other chronic pain: Secondary | ICD-10-CM | POA: Diagnosis present

## 2012-03-17 DIAGNOSIS — G4733 Obstructive sleep apnea (adult) (pediatric): Secondary | ICD-10-CM

## 2012-03-17 DIAGNOSIS — N179 Acute kidney failure, unspecified: Secondary | ICD-10-CM

## 2012-03-17 DIAGNOSIS — I10 Essential (primary) hypertension: Secondary | ICD-10-CM

## 2012-03-17 DIAGNOSIS — R6889 Other general symptoms and signs: Secondary | ICD-10-CM

## 2012-03-17 DIAGNOSIS — J189 Pneumonia, unspecified organism: Secondary | ICD-10-CM

## 2012-03-17 DIAGNOSIS — E662 Morbid (severe) obesity with alveolar hypoventilation: Secondary | ICD-10-CM | POA: Diagnosis present

## 2012-03-17 DIAGNOSIS — J45909 Unspecified asthma, uncomplicated: Secondary | ICD-10-CM | POA: Diagnosis present

## 2012-03-17 DIAGNOSIS — R0602 Shortness of breath: Secondary | ICD-10-CM

## 2012-03-17 LAB — TROPONIN I: Troponin I: <0.3 ng/mL (ref <0.30)

## 2012-03-17 LAB — COMPREHENSIVE METABOLIC PANEL
Albumin: 3.4 g/dL — ABNORMAL LOW (ref 3.5–5.2)
BUN: 15 mg/dL (ref 6–23)
Chloride: 101 mEq/L (ref 96–112)
Creatinine, Ser: 1.15 mg/dL — ABNORMAL HIGH (ref 0.50–1.10)
Total Bilirubin: 0.3 mg/dL (ref 0.3–1.2)
Total Protein: 6.9 g/dL (ref 6.0–8.3)

## 2012-03-17 LAB — INFLUENZA PANEL BY PCR (TYPE A & B): H1N1 flu by pcr: NOT DETECTED

## 2012-03-17 LAB — CBC WITH DIFFERENTIAL/PLATELET
Basophils Relative: 0 % (ref 0–1)
Eosinophils Absolute: 0.3 10*3/uL (ref 0.0–0.7)
HCT: 37.2 % (ref 36.0–46.0)
Hemoglobin: 11.8 g/dL — ABNORMAL LOW (ref 12.0–15.0)
MCH: 30.6 pg (ref 26.0–34.0)
MCHC: 31.7 g/dL (ref 30.0–36.0)
Monocytes Absolute: 0.6 10*3/uL (ref 0.1–1.0)
Monocytes Relative: 7 % (ref 3–12)

## 2012-03-17 LAB — CBC
MCHC: 30.4 g/dL (ref 30.0–36.0)
Platelets: 278 10*3/uL (ref 150–400)
RDW: 14.6 % (ref 11.5–15.5)
WBC: 8.2 10*3/uL (ref 4.0–10.5)

## 2012-03-17 LAB — BASIC METABOLIC PANEL
BUN: 14 mg/dL (ref 6–23)
Calcium: 8.8 mg/dL (ref 8.4–10.5)
Chloride: 103 mEq/L (ref 96–112)
Creatinine, Ser: 1.11 mg/dL — ABNORMAL HIGH (ref 0.50–1.10)
GFR calc Af Amer: 67 mL/min — ABNORMAL LOW (ref >90)
GFR calc non Af Amer: 58 mL/min — ABNORMAL LOW (ref >90)

## 2012-03-17 LAB — MRSA PCR SCREENING: MRSA by PCR: POSITIVE — AB

## 2012-03-17 LAB — PRO B NATRIURETIC PEPTIDE: Pro B Natriuretic peptide (BNP): 514.3 pg/mL — ABNORMAL HIGH (ref 0–125)

## 2012-03-17 MED ORDER — ONDANSETRON HCL 4 MG/2ML IJ SOLN: 4.0000 mg | Freq: Four times a day (QID) | INTRAMUSCULAR | Status: DC | PRN

## 2012-03-17 MED ORDER — AMITRIPTYLINE HCL 75 MG PO TABS
75.0000 mg | ORAL_TABLET | Freq: Every day | ORAL | Status: DC
  Administered 2012-03-17 – 2012-03-20 (×4): 75 mg via ORAL
  Filled 2012-03-17 (×5): qty 1

## 2012-03-17 MED ORDER — ACETAMINOPHEN 325 MG PO TABS: 650.0000 mg | ORAL_TABLET | Freq: Four times a day (QID) | ORAL | Status: DC | PRN

## 2012-03-17 MED ORDER — GUAIFENESIN ER 600 MG PO TB12
1200.0000 mg | ORAL_TABLET | Freq: Two times a day (BID) | ORAL | Status: DC
  Administered 2012-03-17 – 2012-03-21 (×9): 1200 mg via ORAL
  Filled 2012-03-17 (×11): qty 2

## 2012-03-17 MED ORDER — BIOTENE DRY MOUTH MT LIQD
15.0000 mL | Freq: Two times a day (BID) | OROMUCOSAL | Status: DC
  Administered 2012-03-17 – 2012-03-21 (×7): 15 mL via OROMUCOSAL

## 2012-03-17 MED ORDER — SODIUM CHLORIDE 0.9 % IV SOLN
INTRAVENOUS | Status: DC
  Administered 2012-03-17: 05:00:00 via INTRAVENOUS

## 2012-03-17 MED ORDER — ENOXAPARIN SODIUM 40 MG/0.4ML ~~LOC~~ SOLN
40.0000 mg | SUBCUTANEOUS | Status: DC
  Filled 2012-03-17: qty 0.4

## 2012-03-17 MED ORDER — DIPHENHYDRAMINE HCL 50 MG/ML IJ SOLN
25.0000 mg | Freq: Four times a day (QID) | INTRAMUSCULAR | Status: DC | PRN
  Administered 2012-03-17 – 2012-03-19 (×3): 25 mg via INTRAVENOUS
  Filled 2012-03-17 (×3): qty 1

## 2012-03-17 MED ORDER — PIPERACILLIN-TAZOBACTAM 3.375 G IVPB
3.3750 g | Freq: Once | INTRAVENOUS | Status: AC
  Administered 2012-03-17: 3.375 g via INTRAVENOUS
  Filled 2012-03-17: qty 50

## 2012-03-17 MED ORDER — IPRATROPIUM BROMIDE 0.02 % IN SOLN
0.5000 mg | Freq: Once | RESPIRATORY_TRACT | Status: AC
  Administered 2012-03-17: 0.5 mg via RESPIRATORY_TRACT
  Filled 2012-03-17: qty 2.5

## 2012-03-17 MED ORDER — CHLORHEXIDINE GLUCONATE CLOTH 2 % EX PADS
6.0000 | MEDICATED_PAD | Freq: Every day | CUTANEOUS | Status: AC
Start: 2012-03-17 — End: 2012-03-21
  Administered 2012-03-17 – 2012-03-21 (×5): 6 via TOPICAL

## 2012-03-17 MED ORDER — ACETAMINOPHEN 650 MG RE SUPP: 650.0000 mg | Freq: Four times a day (QID) | RECTAL | Status: DC | PRN

## 2012-03-17 MED ORDER — KETOCONAZOLE 2 % EX CREA
TOPICAL_CREAM | Freq: Every day | CUTANEOUS | Status: DC
  Administered 2012-03-17 – 2012-03-21 (×5): via TOPICAL
  Filled 2012-03-17: qty 15

## 2012-03-17 MED ORDER — VANCOMYCIN HCL 10 G IV SOLR
1500.0000 mg | Freq: Two times a day (BID) | INTRAVENOUS | Status: DC
  Administered 2012-03-17 – 2012-03-21 (×9): 1500 mg via INTRAVENOUS
  Filled 2012-03-17 (×10): qty 1500

## 2012-03-17 MED ORDER — DIPHENHYDRAMINE HCL 50 MG/ML IJ SOLN
25.0000 mg | Freq: Once | INTRAMUSCULAR | Status: AC
  Administered 2012-03-17: 25 mg via INTRAVENOUS
  Filled 2012-03-17: qty 1

## 2012-03-17 MED ORDER — HYDROMORPHONE HCL PF 1 MG/ML IJ SOLN
0.5000 mg | INTRAMUSCULAR | Status: DC | PRN
  Administered 2012-03-17 – 2012-03-19 (×10): 1 mg via INTRAVENOUS
  Filled 2012-03-17 (×10): qty 1

## 2012-03-17 MED ORDER — MUPIROCIN 2 % EX OINT
1.0000 "application " | TOPICAL_OINTMENT | Freq: Two times a day (BID) | CUTANEOUS | Status: DC
  Administered 2012-03-17 – 2012-03-21 (×9): 1 via NASAL
  Filled 2012-03-17: qty 22

## 2012-03-17 MED ORDER — ASPIRIN 325 MG PO TABS
325.0000 mg | ORAL_TABLET | Freq: Every day | ORAL | Status: DC
  Administered 2012-03-17 – 2012-03-21 (×5): 325 mg via ORAL
  Filled 2012-03-17 (×5): qty 1

## 2012-03-17 MED ORDER — METHYLPREDNISOLONE SODIUM SUCC 125 MG IJ SOLR
80.0000 mg | Freq: Two times a day (BID) | INTRAMUSCULAR | Status: AC
  Administered 2012-03-18 – 2012-03-19 (×2): 80 mg via INTRAVENOUS
  Filled 2012-03-17 (×2): qty 1.28

## 2012-03-17 MED ORDER — GUAIFENESIN-DM 100-10 MG/5ML PO SYRP
5.0000 mL | ORAL_SOLUTION | ORAL | Status: DC | PRN
  Administered 2012-03-17 – 2012-03-21 (×11): 5 mL via ORAL
  Filled 2012-03-17 (×11): qty 5

## 2012-03-17 MED ORDER — PANTOPRAZOLE SODIUM 40 MG PO TBEC
40.0000 mg | DELAYED_RELEASE_TABLET | Freq: Every day | ORAL | Status: DC
  Administered 2012-03-17 – 2012-03-21 (×4): 40 mg via ORAL
  Filled 2012-03-17: qty 1
  Filled 2012-03-17: qty 4
  Filled 2012-03-17 (×2): qty 1

## 2012-03-17 MED ORDER — ALBUTEROL (5 MG/ML) CONTINUOUS INHALATION SOLN
10.0000 mg/h | INHALATION_SOLUTION | Freq: Once | RESPIRATORY_TRACT | Status: AC
  Administered 2012-03-17: 10 mg/h via RESPIRATORY_TRACT
  Filled 2012-03-17 (×2): qty 20

## 2012-03-17 MED ORDER — ALBUTEROL SULFATE (5 MG/ML) 0.5% IN NEBU
2.5000 mg | INHALATION_SOLUTION | Freq: Four times a day (QID) | RESPIRATORY_TRACT | Status: DC
  Administered 2012-03-17 – 2012-03-21 (×15): 2.5 mg via RESPIRATORY_TRACT
  Filled 2012-03-17 (×16): qty 0.5

## 2012-03-17 MED ORDER — CLONIDINE HCL 0.2 MG PO TABS
0.2000 mg | ORAL_TABLET | Freq: Two times a day (BID) | ORAL | Status: DC
  Administered 2012-03-17 – 2012-03-21 (×9): 0.2 mg via ORAL
  Filled 2012-03-17 (×10): qty 1

## 2012-03-17 MED ORDER — ALUM & MAG HYDROXIDE-SIMETH 200-200-20 MG/5ML PO SUSP: 30.0000 mL | Freq: Four times a day (QID) | ORAL | Status: DC | PRN

## 2012-03-17 MED ORDER — ZOLPIDEM TARTRATE 5 MG PO TABS: 5.0000 mg | ORAL_TABLET | Freq: Every evening | ORAL | Status: DC | PRN

## 2012-03-17 MED ORDER — OXYCODONE HCL 5 MG PO TABS
5.0000 mg | ORAL_TABLET | ORAL | Status: DC | PRN
  Administered 2012-03-20 – 2012-03-21 (×6): 5 mg via ORAL
  Filled 2012-03-17 (×6): qty 1

## 2012-03-17 MED ORDER — FUROSEMIDE 40 MG PO TABS
40.0000 mg | ORAL_TABLET | Freq: Every day | ORAL | Status: DC
  Administered 2012-03-18 – 2012-03-21 (×4): 40 mg via ORAL
  Filled 2012-03-17 (×5): qty 1

## 2012-03-17 MED ORDER — BENZONATATE 100 MG PO CAPS
100.0000 mg | ORAL_CAPSULE | Freq: Once | ORAL | Status: AC
  Administered 2012-03-17: 100 mg via ORAL
  Filled 2012-03-17: qty 1

## 2012-03-17 MED ORDER — ONDANSETRON HCL 4 MG PO TABS: 4.0000 mg | ORAL_TABLET | Freq: Four times a day (QID) | ORAL | Status: DC | PRN

## 2012-03-17 MED ORDER — CLONAZEPAM 1 MG PO TABS
1.0000 mg | ORAL_TABLET | Freq: Three times a day (TID) | ORAL | Status: DC | PRN
  Administered 2012-03-18 – 2012-03-21 (×5): 1 mg via ORAL
  Filled 2012-03-17 (×5): qty 1

## 2012-03-17 MED ORDER — ENOXAPARIN SODIUM 100 MG/ML ~~LOC~~ SOLN
90.0000 mg | SUBCUTANEOUS | Status: DC
  Filled 2012-03-17 (×5): qty 1

## 2012-03-17 MED ORDER — METHYLPREDNISOLONE SODIUM SUCC 125 MG IJ SOLR
125.0000 mg | Freq: Two times a day (BID) | INTRAMUSCULAR | Status: AC
  Administered 2012-03-17 – 2012-03-18 (×2): 125 mg via INTRAVENOUS
  Filled 2012-03-17 (×2): qty 2

## 2012-03-17 MED ORDER — PIPERACILLIN-TAZOBACTAM 3.375 G IVPB
3.3750 g | Freq: Three times a day (TID) | INTRAVENOUS | Status: DC
  Administered 2012-03-17 – 2012-03-21 (×12): 3.375 g via INTRAVENOUS
  Filled 2012-03-17 (×15): qty 50

## 2012-03-17 MED ORDER — ALBUTEROL SULFATE (5 MG/ML) 0.5% IN NEBU: 2.5000 mg | INHALATION_SOLUTION | RESPIRATORY_TRACT | Status: DC | PRN

## 2012-03-17 MED ORDER — FUROSEMIDE 10 MG/ML IJ SOLN
40.0000 mg | Freq: Once | INTRAMUSCULAR | Status: AC
  Administered 2012-03-17: 40 mg via INTRAVENOUS
  Filled 2012-03-17: qty 4

## 2012-03-17 MED ORDER — METHYLPREDNISOLONE SODIUM SUCC 125 MG IJ SOLR
125.0000 mg | Freq: Once | INTRAMUSCULAR | Status: AC
  Administered 2012-03-17: 125 mg via INTRAVENOUS
  Filled 2012-03-17: qty 2

## 2012-03-17 MED ORDER — MORPHINE SULFATE 4 MG/ML IJ SOLN
4.0000 mg | Freq: Once | INTRAMUSCULAR | Status: AC
  Administered 2012-03-17: 4 mg via INTRAVENOUS
  Filled 2012-03-17: qty 1

## 2012-03-17 MED ORDER — METHYLPREDNISOLONE SODIUM SUCC 125 MG IJ SOLR: 125.0000 mg | Freq: Once | INTRAMUSCULAR | Status: DC

## 2012-03-17 MED ORDER — VANCOMYCIN HCL IN DEXTROSE 1-5 GM/200ML-% IV SOLN
1000.0000 mg | Freq: Once | INTRAVENOUS | Status: AC
  Administered 2012-03-17: 1000 mg via INTRAVENOUS
  Filled 2012-03-17: qty 200

## 2012-03-17 NOTE — Progress Notes (Signed)
   Patient seen earlier today by my colleague Dr. Arnoldo Morale.  Patient seen and examined, data base reviewed.  She has history of morbid obesity, HTN and chronic respiratory failure on 2-3 L of oxygen.  Has community-acquired pneumonia, worse after 2 days of antibiotics.  Antibiotics escalated to vancomycin and Zosyn.  Birdie Hopes [phone] 03/17/2012, 1:44 PM

## 2012-03-17 NOTE — ED Provider Notes (Signed)
History     CSN: [id number]  Arrival date & time 03/16/12  2232   First MD Initiated Contact with Patient 03/17/12 0054      Chief Complaint  Patient presents with  . Shortness of Breath    (Consider location/radiation/quality/duration/timing/severity/associated sxs/prior treatment) HPI Pt seen and treated for pneumonia on 12/21. Was given Levaquin. Pt states she has been compliant with medications and using nebs at home. She continues to have productive cough, pleuritic chest pain, SOB and wheezing. No new lower ext swelling or pain.  Past Medical History  Diagnosis Date  . Asthma   . GERD (gastroesophageal reflux disease)   . Hypertension   . Homelessness   . Low back pain   . 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 Arthritis   . Gout   . Hyperlipemia   . Morbid obesity     uses motor wheel chair  . MRSA (methicillin resistant Staphylococcus aureus)     states about a year ago    Past Surgical History  Procedure Date  . Tubal ligation   . Cesarean section     Family History  Problem Relation Age of Onset  . Asthma Mother   . Diabetes Father   . Heart disease Father   . Stroke Father     History  Substance Use Topics  . Smoking status: Former Smoker    Types: Cigarettes    Quit date: 03/25/2003  . Smokeless tobacco: Former Systems developer    Quit date: 05/27/1978  . Alcohol Use: No    OB History    Grav Para Term Preterm Abortions TAB SAB Ect Mult Living                  Review of Systems  Constitutional: Positive for fever, chills and fatigue.  HENT: Negative for neck pain and neck stiffness.   Respiratory: Positive for cough, shortness of breath and wheezing.   Cardiovascular: Positive for chest pain. Negative for palpitations and leg swelling.  Gastrointestinal: Negative for nausea, vomiting and abdominal pain.  Musculoskeletal: Negative for myalgias and back pain.  Skin: Positive for rash. Negative for pallor and wound.  Neurological:  Negative for dizziness, weakness, light-headedness, numbness and headaches.  All other systems reviewed and are negative.    Allergies  Ibuprofen; Adhesive; Azithromycin; Doxycycline; and Sulfamethoxazole w-trimethoprim  Home Medications   Current Outpatient Rx  Name  Route  Sig  Dispense  Refill  . ALBUTEROL SULFATE HFA 108 (90 BASE) MCG/ACT IN AERS   Inhalation   Inhale 2 puffs into the lungs every 4 (four) hours as needed. For asthma relief         . ALBUTEROL SULFATE (2.5 MG/3ML) 0.083% IN NEBU   Nebulization   Take 3 mLs (2.5 mg total) by nebulization every 6 (six) hours as needed for wheezing.   75 mL   12   . AMITRIPTYLINE HCL 75 MG PO TABS   Oral   Take 75 mg by mouth at bedtime.         . AMMONIUM LACTATE 12 % EX CREA   Topical   Apply 1 g topically as needed. Applies to back and legs.Marland KitchenMarland KitchenIn a thin layer on affected area for dry patches.         . ASPIRIN 325 MG PO TABS   Oral   Take 325 mg by mouth daily.         . ATORVASTATIN CALCIUM 20 MG PO  TABS   Oral   Take 20 mg by mouth daily.         [name] CLARITHROMYCIN 500 MG PO TABS   Oral   Take 1 tablet (500 mg total) by mouth 2 (two) times daily.   14 tablet   0   . CLONAZEPAM 1 MG PO TABS   Oral   Take 1 mg by mouth 3 (three) times daily as needed. For anxiety         . CLONIDINE HCL 0.2 MG PO TABS   Oral   Take 0.2 mg by mouth 2 (two) times daily.         [name] DIPHENHYDRAMINE HCL 25 MG PO CAPS   Oral   Take 25 mg by mouth every 6 (six) hours as needed. For itching         . EUCERIN EX LOTN   Topical   Apply topically 2 (two) times daily as needed. On affected areas for dry patches.         [name] FAMOTIDINE 20 MG PO TABS   Oral   Take 1 tablet (20 mg total) by mouth 2 (two) times daily.   30 tablet   0   . FUROSEMIDE 40 MG PO TABS   Oral   Take 1 tablet (40 mg total) by mouth daily.   30 tablet      . HYDROCODONE-ACETAMINOPHEN 10-325 MG PO TABS   Oral   Take 1 tablet by mouth  every 6 (six) hours as needed. For pain   10 tablet   0   . KETOCONAZOLE 2 % EX CREA   Topical   Apply topically daily.         [name] KETOCONAZOLE 2 % EX SHAM   Topical   Apply 1 application topically 2 (two) times a week.         [name] KETOCONAZOLE 200 MG PO TABS   Oral   Take 200 mg by mouth daily.         [name] LEVOFLOXACIN 500 MG PO TABS   Oral   Take 1 tablet (500 mg total) by mouth daily.   10 tablet   0   . LUBIPROSTONE 24 MCG PO CAPS   Oral   Take 24 mcg by mouth every evening.          . OXYCODONE-ACETAMINOPHEN 5-325 MG PO TABS   Oral   Take 1 tablet by mouth every 4 (four) hours as needed for pain.   20 tablet   0   . PANTOPRAZOLE SODIUM 40 MG PO TBEC   Oral   Take 40 mg by mouth daily.         [name] PROMETHAZINE HCL 25 MG PO TABS   Oral   Take 25 mg by mouth every 6 (six) hours as needed. For nausea/vomiting         . SOLIFENACIN SUCCINATE 5 MG PO TABS   Oral   Take 5 mg by mouth 2 (two) times daily.           BP 121/72  Pulse 80  Temp 98.2 F (36.8 C) (Oral)  Resp 16  SpO2 74%  LMP 03/24/2009  Physical Exam  Nursing note and vitals reviewed. Constitutional: She is oriented to person, place, and time. She appears well-developed and well-nourished. No distress.       Obese, audible wheezing  HENT:  Head: Normocephalic and atraumatic.  Mouth/Throat: Oropharynx is clear and moist. No oropharyngeal exudate.  Eyes:  EOM are normal. Pupils are equal, round, and reactive to light.  Neck: Normal range of motion. Neck supple.       No meningismus   Cardiovascular: Normal rate and regular rhythm.  Exam reveals no gallop and no friction rub.   No murmur heard. Pulmonary/Chest: Effort normal. No respiratory distress. She has wheezes (diffuse wheezing throughout). She has no rales. She exhibits no tenderness.  Abdominal: Soft. Bowel sounds are normal. She exhibits no distension and no mass. There is no tenderness. There is no rebound and no guarding.   Musculoskeletal: Normal range of motion. She exhibits no edema and no tenderness.       No pitting edema  Neurological: She is alert and oriented to person, place, and time.       Moves all ext, sensation grossly intact  Skin: Skin is warm and dry. Rash (erythematous papular rash to bl upper ext.) noted. No erythema.  Psychiatric: She has a normal mood and affect. Her behavior is normal.    ED Course  Procedures (including critical care time)  Labs Reviewed  CBC WITH DIFFERENTIAL - Abnormal; Notable for the following:    RBC 3.86 (*)     Hemoglobin 11.8 (*)     All other components within normal limits  COMPREHENSIVE METABOLIC PANEL - Abnormal; Notable for the following:    Glucose, Bld 102 (*)     Creatinine, Ser 1.15 (*)     Albumin 3.4 (*)     GFR calc non Af Amer 56 (*)     GFR calc Af Amer 65 (*)     All other components within normal limits  PRO B NATRIURETIC PEPTIDE - Abnormal; Notable for the following:    Pro B Natriuretic peptide (BNP) 514.3 (*)     All other components within normal limits  TROPONIN I  INFLUENZA PANEL BY PCR  CULTURE, BLOOD (ROUTINE X 2)  CULTURE, BLOOD (ROUTINE X 2)   Dg Chest 2 View  03/16/2012  *RADIOLOGY REPORT*  Clinical Data: Shortness of breath and cough.  CHEST - 2 VIEW  Comparison: Chest radiograph and CT of the chest performed 03/14/2012  Findings: The lungs are relatively well-aerated.  Mildly worsened right upper and middle lobe airspace opacities are again noted, concerning for pneumonia.  Underlying vascular congestion is seen. No definite pleural effusion or pneumothorax is identified.  The heart is borderline normal in size; the mediastinal contour is within normal limits.  No acute osseous abnormalities are seen.  IMPRESSION: Mildly worsened right upper and middle lobe airspace opacities, concerning for mildly worsened pneumonia.  Underlying vascular congestion noted.   Original Report Authenticated By: Santa Lighter, M.D.      1.  Community acquired pneumonia   2. Asthma      Date: 03/17/2012  Rate: 73  Rhythm: normal sinus rhythm  QRS Axis: normal  Intervals: normal  ST/T Wave abnormalities: normal  Conduction Disutrbances:none  Narrative Interpretation:   Old EKG Reviewed: unchanged    MDM   Discussed with Dr Arnoldo Morale. Will admit.        Julianne Rice, MD 03/17/12 [phone]

## 2012-03-17 NOTE — Progress Notes (Signed)
ANTIBIOTIC CONSULT NOTE - INITIAL  Pharmacy Consult for vancomycin and Zosyn Indication: rule out pneumonia  Allergies  Allergen Reactions  . Ibuprofen Hives, Shortness Of Breath and Palpitations  . Adhesive (Tape) Rash  . Azithromycin Hives and Rash  . Doxycycline Rash  . Sulfamethoxazole W-Trimethoprim Rash    Patient Measurements:    Vital Signs: Temp: 98.2 F (36.8 C) (12/24 2249) Temp src: Oral (12/24 2249) BP: 120/78 mmHg (12/25 0415) Pulse Rate: 78  (12/25 0415) Intake/Output from previous day:   Intake/Output from this shift:    Labs:  Basename 03/17/12 0126  WBC 8.2  HGB 11.8*  PLT 286  LABCREA --  CREATININE 1.15*   The CrCl is unknown because both a height and weight (above a minimum accepted value) are required for this calculation. No results found for this basename: VANCOTROUGH:2,VANCOPEAK:2,VANCORANDOM:2,GENTTROUGH:2,GENTPEAK:2,GENTRANDOM:2,TOBRATROUGH:2,TOBRAPEAK:2,TOBRARND:2,AMIKACINPEAK:2,AMIKACINTROU:2,AMIKACIN:2, in the last 72 hours   Microbiology: No results found for this or any previous visit (from the past 720 hour(s)).  Medical History: Past Medical History  Diagnosis Date  . Asthma   . GERD (gastroesophageal reflux disease)   . Hypertension   . Homelessness   . Low back pain   . 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 Arthritis   . Gout   . Hyperlipemia   . Morbid obesity     uses motor wheel chair  . MRSA (methicillin resistant Staphylococcus aureus)     states about a year ago    Medications:  Scheduled:    . albuterol  2.5 mg Nebulization Q6H  . [COMPLETED] albuterol  5 mg Nebulization Once  . [COMPLETED] albuterol  10 mg/hr Nebulization Once  . [COMPLETED] benzonatate  100 mg Oral Once  . [COMPLETED] diphenhydrAMINE  25 mg Intravenous Once  . enoxaparin (LOVENOX) injection  40 mg Subcutaneous Q24H  . [COMPLETED] ipratropium  0.5 mg Nebulization Once  . [COMPLETED] methylPREDNISolone (SOLU-MEDROL)  injection  125 mg Intravenous Once  . [COMPLETED]  morphine injection  4 mg Intravenous Once  . [COMPLETED] vancomycin  1,000 mg Intravenous Once  . [DISCONTINUED] methylPREDNISolone (SOLU-MEDROL) injection  125 mg Intravenous Once   Assessment: 46 yo female admitted with possible pneumonia. Pharmacy to manage vancomycin and Zosyn. Patient already received vancomycin 1gm x 1 and Zosyn 3.375gm IV x 1.  Goal of Therapy:  Vancomycin trough level 15-20 mcg/ml  Plan:  1. Zosyn 3.375gm IV Q8H (4 hr infusion). 2. Vancomycin 1500mg  IV Q12H.   Otila Back 03/17/2012,5:03 AM

## 2012-03-17 NOTE — Progress Notes (Signed)
Patient refuses to go on  CPAP at this time.  Patient states that she is still due some medicine tonight.

## 2012-03-17 NOTE — Progress Notes (Signed)
Wheezing has improved post nebulizer. Some expiratory wheezing in upper airway.

## 2012-03-17 NOTE — H&P (Signed)
[facility] History and Physical  Kara Vincent [id number] DOB: 12/30/1965 DOA: 03/17/2012  Referring physician: EDP PCP: Henderson Baltimore, MD  Specialists:   Chief Complaint: SOB  HPI: Kara Vincent is a 46 y.o. female with Multiple Medical Problems who had been seen in the ED on 12/21 and diagnosed with Pneumonia and placed on Levaquin who returns to the ED with complaints of worsening  SOB, Cough, and wheezing along with fevers and chills.     Review of Systems: The patient denies anorexia, fever, weight loss vision loss, decreased hearing, hoarseness, chest pain, syncope, dyspnea on exertion, peripheral edema, balance deficits, hemoptysis, abdominal pain,  Nausea, vomiting, diarrhea, constipation, hematemesis, melena, hematochezia, severe indigestion/heartburn, dysuria, hematuria, incontinence, genital sores, muscle weakness, suspicious skin lesions, transient blindness, difficulty walking, depression, unusual weight change, abnormal bleeding, enlarged lymph nodes, angioedema, and breast masses.    Past Medical History  Diagnosis Date  . Asthma   . GERD (gastroesophageal reflux disease)   . Hypertension   . Homelessness   . Low back pain   . 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 Arthritis   . Gout   . Hyperlipemia   . Morbid obesity     uses motor wheel chair  . MRSA (methicillin resistant Staphylococcus aureus)     states about a year ago   Past Surgical History  Procedure Date  . Tubal ligation   . Cesarean section     Medications:  HOME MEDS: Prior to Admission medications   Medication Sig Start Date End Date Taking? Authorizing Provider  albuterol (PROVENTIL HFA) 108 (90 BASE) MCG/ACT inhaler Inhale 2 puffs into the lungs every 4 (four) hours as needed. For asthma relief   Yes Historical Provider, MD  albuterol (PROVENTIL) (2.5 MG/3ML) 0.083% nebulizer solution Take 3 mLs (2.5 mg total) by nebulization every 6 (six) hours as needed  for wheezing. 05/27/11 05/26/12 Yes Tatyana A Kirichenko, PA  amitriptyline (ELAVIL) 75 MG tablet Take 75 mg by mouth at bedtime.   Yes Historical Provider, MD  ammonium lactate (AMLACTIN) 12 % cream Apply 1 g topically as needed. Applies to back and legs.Marland KitchenMarland KitchenIn a thin layer on affected area for dry patches.   Yes Historical Provider, MD  aspirin 325 MG tablet Take 325 mg by mouth daily.   Yes Historical Provider, MD  atorvastatin (LIPITOR) 20 MG tablet Take 20 mg by mouth daily.   Yes Historical Provider, MD  clarithromycin (BIAXIN) 500 MG tablet Take 1 tablet (500 mg total) by mouth 2 (two) times daily. 03/12/12  Yes Catherine E Schinlever, PA-C  clonazePAM (KLONOPIN) 1 MG tablet Take 1 mg by mouth 3 (three) times daily as needed. For anxiety   Yes Historical Provider, MD  cloNIDine (CATAPRES) 0.2 MG tablet Take 0.2 mg by mouth 2 (two) times daily.   Yes Historical Provider, MD  diphenhydrAMINE (BENADRYL) 25 mg capsule Take 25 mg by mouth every 6 (six) hours as needed. For itching   Yes Historical Provider, MD  Emollient (EUCERIN) lotion Apply topically 2 (two) times daily as needed. On affected areas for dry patches.   Yes Historical Provider, MD  famotidine (PEPCID) 20 MG tablet Take 1 tablet (20 mg total) by mouth 2 (two) times daily. 06/15/11 06/14/12 Yes Garald Balding, NP  furosemide (LASIX) 40 MG tablet Take 1 tablet (40 mg total) by mouth daily. 01/01/12  Yes Charlynne Cousins, MD  HYDROcodone-acetaminophen [facility]) 10-325 MG per tablet Take 1  tablet by mouth every 6 (six) hours as needed. For pain 12/28/11  Yes Charlynne Cousins, MD  ketoconazole (NIZORAL) 2 % cream Apply topically daily.   Yes Historical Provider, MD  ketoconazole (NIZORAL) 2 % shampoo Apply 1 application topically 2 (two) times a week.   Yes Historical Provider, MD  ketoconazole (NIZORAL) 200 MG tablet Take 200 mg by mouth daily.   Yes Historical Provider, MD  levofloxacin (LEVAQUIN) 500 MG tablet Take 1 tablet (500 mg total)  by mouth daily. 03/14/12  Yes Hoy Morn, MD  lubiprostone (AMITIZA) 24 MCG capsule Take 24 mcg by mouth every evening.    Yes Historical Provider, MD  oxyCODONE-acetaminophen (PERCOCET/ROXICET) 5-325 MG per tablet Take 1 tablet by mouth every 4 (four) hours as needed for pain. 03/12/12  Yes Catherine E Schinlever, PA-C  pantoprazole (PROTONIX) 40 MG tablet Take 40 mg by mouth daily.   Yes Historical Provider, MD  promethazine (PHENERGAN) 25 MG tablet Take 25 mg by mouth every 6 (six) hours as needed. For nausea/vomiting   Yes Historical Provider, MD  solifenacin (VESICARE) 5 MG tablet Take 5 mg by mouth 2 (two) times daily.   Yes Historical Provider, MD    Allergies:  Allergies  Allergen Reactions  . Ibuprofen Hives, Shortness Of Breath and Palpitations  . Adhesive (Tape) Rash  . Azithromycin Hives and Rash  . Doxycycline Rash  . Sulfamethoxazole W-Trimethoprim Rash    Social History:   reports that she quit smoking about 8 years ago. Her smoking use included Cigarettes. She quit smokeless tobacco use about 33 years ago. She reports that she does not drink alcohol or use illicit drugs.  Family History: Family History  Problem Relation Age of Onset  . Asthma Mother   . Diabetes Father   . Heart disease Father   . Stroke Father     Physical Exam:  GEN: Pleasant Morbidly Obese Caucasian Female  examined  and in no acute distress; cooperative with exam Filed Vitals:   03/17/12 0330 03/17/12 0345 03/17/12 0400 03/17/12 0415  BP: 111/64 121/72 111/32 120/78  Pulse: 86 80 83 78  Temp:      TempSrc:      Resp:      SpO2: 90% 74% 98% 98%   Blood pressure 120/78, pulse 78, temperature 98.2 F (36.8 C), temperature source Oral, resp. rate 16, last menstrual period 03/24/2009, SpO2 98.00%. PSYCH: She is alert and oriented x4; does not appear anxious does not appear depressed; affect is normal HEENT: Normocephalic and Atraumatic, Mucous membranes pink; PERRLA; EOM intact; Fundi:   Benign;  No scleral icterus, Nares: Patent, Oropharynx: Clear, Fair Dentition, Neck:  FROM, no cervical lymphadenopathy nor thyromegaly or carotid bruit; no JVD; Breasts:: Not examined CHEST WALL: No tenderness CHEST:  Decreased Breath Sounds, Diffuse expiratory wheezes HEART: Regular rate and rhythm; no murmurs rubs or gallops BACK: No kyphosis or scoliosis; no CVA tenderness ABDOMEN: Positive Bowel Sounds, Scaphoid, Obese, soft non-tender; no masses, no organomegaly, no pannus; no intertriginous candida. Rectal Exam: Not done EXTREMITIES: No bone or joint deformity; age-appropriate arthropathy of the hands and knees; no cyanosis, clubbing or edema; no ulcerations. Genitalia: not examined PULSES: 2+ and symmetric SKIN: Normal hydration no rash or ulceration CNS: Cranial nerves 2-12 grossly intact no focal neurologic deficit    Labs on Admission:  Basic Metabolic Panel:  Lab [id number] 03/14/12 0122 03/12/12 0117  NA 144 138 141  K 3.5 4.1 3.6  CL 101 99 101  CO2 32 30 32  GLUCOSE 102* 105* 90  BUN 15 15 11   CREATININE 1.15* 1.06 1.10  CALCIUM 9.1 8.6 8.7  MG -- -- --  PHOS -- -- --   Liver Function Tests:  Lab 03/17/12 0126  AST 13  ALT 11  ALKPHOS 62  BILITOT 0.3  PROT 6.9  ALBUMIN 3.4*   No results found for this basename: LIPASE:5,AMYLASE:5 in the last 168 hours No results found for this basename: AMMONIA:5 in the last 168 hours CBC:  Lab 03/17/12 0126 03/14/12 0122 03/12/12 0117  WBC 8.2 9.5 9.8  NEUTROABS 5.8 6.6 --  HGB 11.8* 11.7* 11.8*  HCT 37.2 37.2 37.8  MCV 96.4 93.7 93.8  PLT 286 279 291   Cardiac Enzymes:  Lab 03/17/12 0135 03/12/12 0117  CKTOTAL -- --  CKMB -- --  CKMBINDEX -- --  TROPONINI <0.30 <0.30    BNP (last 3 results)  Basename 03/17/12 0135 09/03/11 2200  PROBNP 514.3* 67.8   CBG: No results found for this basename: GLUCAP:5 in the last 168 hours  Radiological Exams on Admission: Dg Chest 2 View  03/16/2012  *RADIOLOGY  REPORT*  Clinical Data: Shortness of breath and cough.  CHEST - 2 VIEW  Comparison: Chest radiograph and CT of the chest performed 03/14/2012  Findings: The lungs are relatively well-aerated.  Mildly worsened right upper and middle lobe airspace opacities are again noted, concerning for pneumonia.  Underlying vascular congestion is seen. No definite pleural effusion or pneumothorax is identified.  The heart is borderline normal in size; the mediastinal contour is within normal limits.  No acute osseous abnormalities are seen.  IMPRESSION: Mildly worsened right upper and middle lobe airspace opacities, concerning for mildly worsened pneumonia.  Underlying vascular congestion noted.   Original Report Authenticated By: Santa Lighter, M.D.     EKG: Independently reviewed.   Assessment: Principal Problem:  *HCAP (healthcare-associated pneumonia) Active Problems:  HYPERTENSION  Chronic pain  Morbid obesity  Asthma  SOB (shortness of breath)    Plan:   Admit to Telemetry Bed Droplet isolation IV Vancomycin and Zosyn IV Steroid taper Nebs, O2 Reconcile Home Medications DVT Prophylaxis CPAP qhs   Code Status:  FULL CODE Family Communication:  N/A Disposition Plan:  TBA  Time spent:  [facility] Pager [phone]  If 7PM-7AM, please contact night-coverage [email or web address] Password [email or web address] 03/17/2012, 5:02 AM

## 2012-03-18 DIAGNOSIS — J189 Pneumonia, unspecified organism: Secondary | ICD-10-CM

## 2012-03-18 DIAGNOSIS — N179 Acute kidney failure, unspecified: Secondary | ICD-10-CM

## 2012-03-18 LAB — BASIC METABOLIC PANEL
BUN: 19 mg/dL (ref 6–23)
CO2: 31 mEq/L (ref 19–32)
Chloride: 104 mEq/L (ref 96–112)
GFR calc non Af Amer: 49 mL/min — ABNORMAL LOW (ref >90)
Glucose, Bld: 186 mg/dL — ABNORMAL HIGH (ref 70–99)
Potassium: 4.4 mEq/L (ref 3.5–5.1)
Sodium: 144 mEq/L (ref 135–145)

## 2012-03-18 LAB — CBC
HCT: 36.4 % (ref 36.0–46.0)
Hemoglobin: 11 g/dL — ABNORMAL LOW (ref 12.0–15.0)
MCH: 29.6 pg (ref 26.0–34.0)
RBC: 3.72 MIL/uL — ABNORMAL LOW (ref 3.87–5.11)

## 2012-03-18 NOTE — Progress Notes (Signed)
[facility] PROGRESS NOTE  Kara Vincent [id number] DOB: January 16, 1966 DOA: 03/17/2012 PCP: Henderson [place], MD  HPI/Subjective: Still has a lot of coughing complains about wheezing.   Assessment/Plan:  Pneumonia -Right middle lobe pneumonia, failed outpatient antibiotics. -Treated as a hospital-acquired pneumonia with vancomycin and Zosyn. -Continue supportive management with bronchodilators, antitussives and oxygen as needed. -Flu PCR is negative.  Acute asthma exacerbation -Patient is having moderate to severe wheezing, probably exacerbated by pneumonia. -Patient is on systemic steroids, continue continue inhaled bronchodilators as well.  Hypertension -History regional control, continue home medications.  Morbid obesity -With BMI of 68, patient has difficulty with ambulation and she uses wheelchair sometimes.  Code Status: Full code Family Communication: Spoke to the patient herself Disposition Plan: Remains inpatient   Consultants:  None  Procedures:  None  Antibiotics:  Vancomycin started 03/17/2012.  Zosyn started 03/17/2012   Objective: Filed Vitals:   03/17/12 2123 03/18/12 0028 03/18/12 0426 03/18/12 1106  BP: 139/72  125/82   Pulse: 93  78   Temp: 98.6 F (37 C)  97.5 F (36.4 C)   TempSrc: Oral  Oral   Resp: 20 20 20    Height:      Weight:      SpO2: 98% 97% 85% 92%    Intake/Output Summary (Last 24 hours) at 03/18/12 1140 Last data filed at 03/17/12 1900  Gross per 24 hour  Intake 1728.33 ml  Output    150 ml  Net 1578.33 ml   Filed Weights   03/17/12 0500 03/17/12 0544  Weight: 182 kg (401 lb 3.8 oz) 185 kg (407 lb 13.6 oz)    Exam:  General: Alert and awake, oriented x3, not in any acute distress. HEENT: anicteric sclera, pupils reactive to light and accommodation, EOMI CVS: S1-S2 clear, no murmur rubs or gallops Chest: clear to auscultation bilaterally, no wheezing, rales or rhonchi Abdomen: soft nontender,  nondistended, normal bowel sounds, no organomegaly Extremities: no cyanosis, clubbing or edema noted bilaterally Neuro: Cranial nerves II-XII intact, no focal neurological deficits  Data Reviewed: Basic Metabolic Panel:  Lab [id number] 03/17/12 0837 03/17/12 0126 03/14/12 0122 03/12/12 0117  NA 144 141 144 138 141  K 4.4 4.4 3.5 4.1 3.6  CL 104 103 101 99 101  CO2 31 29 32 30 32  GLUCOSE 186* 181* 102* 105* 90  BUN 19 14 15 15 11   CREATININE 1.29* 1.11* 1.15* 1.06 1.10  CALCIUM 8.1* 8.8 9.1 8.6 8.7  MG -- -- -- -- --  PHOS -- -- -- -- --   Liver Function Tests:  Lab 03/17/12 0126  AST 13  ALT 11  ALKPHOS 62  BILITOT 0.3  PROT 6.9  ALBUMIN 3.4*   No results found for this basename: LIPASE:5,AMYLASE:5 in the last 168 hours No results found for this basename: AMMONIA:5 in the last 168 hours CBC:  Lab 03/18/12 0622 03/17/12 0837 03/17/12 0126 03/14/12 0122 03/12/12 0117  WBC 12.6* 8.2 8.2 9.5 9.8  NEUTROABS -- -- 5.8 6.6 --  HGB 11.0* 11.9* 11.8* 11.7* 11.8*  HCT 36.4 39.2 37.2 37.2 37.8  MCV 97.8 96.1 96.4 93.7 93.8  PLT 276 278 286 279 291   Cardiac Enzymes:  Lab 03/17/12 0135 03/12/12 0117  CKTOTAL -- --  CKMB -- --  CKMBINDEX -- --  TROPONINI <0.30 <0.30   BNP (last 3 results)  Basename 03/17/12 0135 09/03/11 2200  PROBNP 514.3* 67.8   CBG: No results found for this basename: GLUCAP:5 in the  last 168 hours  Recent Results (from the past 240 hour(s))  CULTURE, BLOOD (ROUTINE X 2)     Status: Normal (Preliminary result)   Collection Time   03/17/12  1:35 AM      Component Value Range Status Comment   Specimen Description BLOOD RIGHT ARM   Final    Special Requests BOTTLES DRAWN AEROBIC AND ANAEROBIC 10CC EACH   Final    Culture  Setup Time 03/17/2012 09:48   Final    Culture     Final    Value:        BLOOD CULTURE RECEIVED NO GROWTH TO DATE CULTURE WILL BE HELD FOR 5 DAYS BEFORE ISSUING A FINAL NEGATIVE REPORT   Report Status PENDING   Incomplete     CULTURE, BLOOD (ROUTINE X 2)     Status: Normal (Preliminary result)   Collection Time   03/17/12  1:40 AM      Component Value Range Status Comment   Specimen Description BLOOD RIGHT ARM   Final    Special Requests BOTTLES DRAWN AEROBIC AND ANAEROBIC 5CC EACH   Final    Culture  Setup Time 03/17/2012 09:48   Final    Culture     Final    Value:        BLOOD CULTURE RECEIVED NO GROWTH TO DATE CULTURE WILL BE HELD FOR 5 DAYS BEFORE ISSUING A FINAL NEGATIVE REPORT   Report Status PENDING   Incomplete   MRSA PCR SCREENING     Status: Abnormal   Collection Time   03/17/12  5:46 AM      Component Value Range Status Comment   MRSA by PCR POSITIVE (*) NEGATIVE Final      Studies: Dg Chest 2 View  03/16/2012  *RADIOLOGY REPORT*  Clinical Data: Shortness of breath and cough.  CHEST - 2 VIEW  Comparison: Chest radiograph and CT of the chest performed 03/14/2012  Findings: The lungs are relatively well-aerated.  Mildly worsened right upper and middle lobe airspace opacities are again noted, concerning for pneumonia.  Underlying vascular congestion is seen. No definite pleural effusion or pneumothorax is identified.  The heart is borderline normal in size; the mediastinal contour is within normal limits.  No acute osseous abnormalities are seen.  IMPRESSION: Mildly worsened right upper and middle lobe airspace opacities, concerning for mildly worsened pneumonia.  Underlying vascular congestion noted.   Original Report Authenticated By: Santa Lighter, M.D.     Scheduled Meds:   . albuterol  2.5 mg Nebulization Q6H  . amitriptyline  75 mg Oral QHS  . antiseptic oral rinse  15 mL Mouth Rinse BID  . aspirin  325 mg Oral Daily  . Chlorhexidine Gluconate Cloth  6 each Topical Q0600  . cloNIDine  0.2 mg Oral BID  . enoxaparin (LOVENOX) injection  90 mg Subcutaneous Q24H  . furosemide  40 mg Oral Daily  . guaiFENesin  1,200 mg Oral BID  . ketoconazole   Topical Daily  . methylPREDNISolone  (SOLU-MEDROL) injection  80 mg Intravenous Q12H  . mupirocin ointment  1 application Nasal BID  . pantoprazole  40 mg Oral Daily  . piperacillin-tazobactam (ZOSYN)  IV  3.375 g Intravenous Q8H  . vancomycin  1,500 mg Intravenous Q12H   Continuous Infusions:   . sodium chloride 50 mL/hr at 03/17/12 [id number]    Principal Problem:  *HCAP (healthcare-associated pneumonia) Active Problems:  HYPERTENSION  Chronic pain  Morbid obesity  Asthma  SOB (shortness of breath)  Time spent: 35 minutes    [facility] Pager [phone]. If 8PM-8AM, please contact night-coverage at [email or web address], password [email or web address] 03/18/2012, 11:40 AM  LOS: 1 day

## 2012-03-18 NOTE — Progress Notes (Signed)
Patient non compliant with wearing O2 and continuous pulse ox.

## 2012-03-19 DIAGNOSIS — N179 Acute kidney failure, unspecified: Secondary | ICD-10-CM

## 2012-03-19 DIAGNOSIS — M549 Dorsalgia, unspecified: Secondary | ICD-10-CM

## 2012-03-19 LAB — BASIC METABOLIC PANEL
BUN: 22 mg/dL (ref 6–23)
CO2: 30 mEq/L (ref 19–32)
Calcium: 8.3 mg/dL — ABNORMAL LOW (ref 8.4–10.5)
Glucose, Bld: 151 mg/dL — ABNORMAL HIGH (ref 70–99)
Potassium: 4.6 mEq/L (ref 3.5–5.1)
Sodium: 142 mEq/L (ref 135–145)

## 2012-03-19 LAB — CBC
Hemoglobin: 11.4 g/dL — ABNORMAL LOW (ref 12.0–15.0)
MCH: 30.1 pg (ref 26.0–34.0)
MCHC: 30.2 g/dL (ref 30.0–36.0)
MCV: 99.7 fL (ref 78.0–100.0)
RBC: 3.79 MIL/uL — ABNORMAL LOW (ref 3.87–5.11)

## 2012-03-19 MED ORDER — PREDNISONE 50 MG PO TABS
60.0000 mg | ORAL_TABLET | Freq: Every day | ORAL | Status: DC
  Administered 2012-03-19 – 2012-03-21 (×3): 60 mg via ORAL
  Filled 2012-03-19 (×4): qty 1

## 2012-03-19 NOTE — Progress Notes (Signed)
[id number]: placed pt on nasal Cpap auto per previous settings. 3L O2 bleed in. Pt tolerating well at this time.

## 2012-03-19 NOTE — Progress Notes (Signed)
[facility] PROGRESS NOTE  Kara Vincent [id number] DOB: 03/08/1966 DOA: 03/17/2012 PCP: Henderson Baltimore, MD  HPI/Subjective: Shortness of breath, better than yesterday but still wheezing and coughing   Assessment/Plan:  Pneumonia -Right middle lobe pneumonia, failed outpatient antibiotics. -Treated as a hospital-acquired pneumonia with vancomycin and Zosyn. -Continue supportive management with bronchodilators, antitussives and oxygen as needed. -Flu PCR is negative.  Acute asthma exacerbation -Patient is having moderate to severe wheezing, probably exacerbated by pneumonia. -Patient is on systemic steroids, continue continue inhaled bronchodilators as well. -Was on Solu-Medrol, will switch to prednisone.  Hypertension -History regional control, continue home medications.  Morbid obesity -With BMI of 68, patient has difficulty with ambulation and she uses wheelchair sometimes.  Code Status: Full code Family Communication: Spoke to the patient herself Disposition Plan: Remains inpatient   Consultants:  None  Procedures:  None  Antibiotics:  Vancomycin started 03/17/2012.  Zosyn started 03/17/2012   Objective: Filed Vitals:   03/19/12 0014 03/19/12 0412 03/19/12 0909 03/19/12 1120  BP: 113/68 118/74  131/74  Pulse: 69 64    Temp: 97.8 F (36.6 C) 96 F (35.6 C)    TempSrc: Oral Oral    Resp: 18 16    Height:      Weight:      SpO2: 95% 96% 95%     Intake/Output Summary (Last 24 hours) at 03/19/12 1522 Last data filed at 03/19/12 1417  Gross per 24 hour  Intake   1390 ml  Output    300 ml  Net   1090 ml   Filed Weights   03/17/12 0500 03/17/12 0544  Weight: 182 kg (401 lb 3.8 oz) 185 kg (407 lb 13.6 oz)    Exam:  General: Alert and awake, oriented x3, not in any acute distress. HEENT: anicteric sclera, pupils reactive to light and accommodation, EOMI CVS: S1-S2 clear, no murmur rubs or gallops Chest: clear to auscultation  bilaterally, no wheezing, rales or rhonchi Abdomen: soft nontender, nondistended, normal bowel sounds, no organomegaly Extremities: no cyanosis, clubbing or edema noted bilaterally Neuro: Cranial nerves II-XII intact, no focal neurological deficits  Data Reviewed: Basic Metabolic Panel:  Lab [id number] 03/18/12 0622 03/17/12 0837 03/17/12 0126 03/14/12 0122  NA 142 144 141 144 138  K 4.6 4.4 4.4 3.5 4.1  CL 104 104 103 101 99  CO2 30 31 29  32 30  GLUCOSE 151* 186* 181* 102* 105*  BUN 22 19 14 15 15   CREATININE 1.09 1.29* 1.11* 1.15* 1.06  CALCIUM 8.3* 8.1* 8.8 9.1 8.6  MG -- -- -- -- --  PHOS -- -- -- -- --   Liver Function Tests:  Lab 03/17/12 0126  AST 13  ALT 11  ALKPHOS 62  BILITOT 0.3  PROT 6.9  ALBUMIN 3.4*   No results found for this basename: LIPASE:5,AMYLASE:5 in the last 168 hours No results found for this basename: AMMONIA:5 in the last 168 hours CBC:  Lab 03/19/12 0912 03/18/12 0622 03/17/12 0837 03/17/12 0126 03/14/12 0122  WBC 11.8* 12.6* 8.2 8.2 9.5  NEUTROABS -- -- -- 5.8 6.6  HGB 11.4* 11.0* 11.9* 11.8* 11.7*  HCT 37.8 36.4 39.2 37.2 37.2  MCV 99.7 97.8 96.1 96.4 93.7  PLT 273 276 278 286 279   Cardiac Enzymes:  Lab 03/17/12 0135  CKTOTAL --  CKMB --  CKMBINDEX --  TROPONINI <0.30   BNP (last 3 results)  Basename 03/17/12 0135 09/03/11 2200  PROBNP 514.3* 67.8   CBG: No results  found for this basename: GLUCAP:5 in the last 168 hours  Recent Results (from the past 240 hour(s))  CULTURE, BLOOD (ROUTINE X 2)     Status: Normal (Preliminary result)   Collection Time   03/17/12  1:35 AM      Component Value Range Status Comment   Specimen Description BLOOD RIGHT ARM   Final    Special Requests BOTTLES DRAWN AEROBIC AND ANAEROBIC 10CC EACH   Final    Culture  Setup Time 03/17/2012 09:48   Final    Culture     Final    Value:        BLOOD CULTURE RECEIVED NO GROWTH TO DATE CULTURE WILL BE HELD FOR 5 DAYS BEFORE ISSUING A FINAL NEGATIVE  REPORT   Report Status PENDING   Incomplete   CULTURE, BLOOD (ROUTINE X 2)     Status: Normal (Preliminary result)   Collection Time   03/17/12  1:40 AM      Component Value Range Status Comment   Specimen Description BLOOD RIGHT ARM   Final    Special Requests BOTTLES DRAWN AEROBIC AND ANAEROBIC 5CC EACH   Final    Culture  Setup Time 03/17/2012 09:48   Final    Culture     Final    Value:        BLOOD CULTURE RECEIVED NO GROWTH TO DATE CULTURE WILL BE HELD FOR 5 DAYS BEFORE ISSUING A FINAL NEGATIVE REPORT   Report Status PENDING   Incomplete   MRSA PCR SCREENING     Status: Abnormal   Collection Time   03/17/12  5:46 AM      Component Value Range Status Comment   MRSA by PCR POSITIVE (*) NEGATIVE Final      Studies: No results found.  Scheduled Meds:    . albuterol  2.5 mg Nebulization Q6H  . amitriptyline  75 mg Oral QHS  . antiseptic oral rinse  15 mL Mouth Rinse BID  . aspirin  325 mg Oral Daily  . Chlorhexidine Gluconate Cloth  6 each Topical Q0600  . cloNIDine  0.2 mg Oral BID  . enoxaparin (LOVENOX) injection  90 mg Subcutaneous Q24H  . furosemide  40 mg Oral Daily  . guaiFENesin  1,200 mg Oral BID  . ketoconazole   Topical Daily  . mupirocin ointment  1 application Nasal BID  . pantoprazole  40 mg Oral Daily  . piperacillin-tazobactam (ZOSYN)  IV  3.375 g Intravenous Q8H  . vancomycin  1,500 mg Intravenous Q12H   Continuous Infusions:   Principal Problem:  *HCAP (healthcare-associated pneumonia) Active Problems:  HYPERTENSION  Chronic pain  Morbid obesity  Asthma  SOB (shortness of breath)    Time spent: 35 minutes    [facility] Pager [phone]. If 8PM-8AM, please contact night-coverage at [email or web address], password [facility] 03/19/2012, 3:22 PM  LOS: 2 days

## 2012-03-19 NOTE — Care Management Note (Unsigned)
    Page 1 of 2   03/19/2012     4:16:58 PM   CARE MANAGEMENT NOTE 03/19/2012  Patient:  MAKENLEE, ASCHEMAN   Account Number:  [id number]  Date Initiated:  03/19/2012  Documentation initiated by:  Tomi Bamberger  Subjective/Objective Assessment:   dx pna  admit     Action/Plan:   Anticipated DC Date:  03/20/2012   Anticipated DC Plan:  [place] referral  Clinical Social Worker      DC Forensic scientist  CM consult      [facility] Choice  HOME HEALTH   Choice offered to / List presented to:  C-1 Patient        [place] arranged  HH-1 RN  [place].   Status of service:  In process, will continue to follow Medicare Important Message given?   (If response is "NO", the following Medicare IM given date fields will be blank) Date Medicare IM given:   Date Additional Medicare IM given:    Discharge Disposition:  [place]  Per UR Regulation:    If discussed at Long Length of Stay Meetings, dates discussed:    Comments:  03/19/12 16:14 Tomi Bamberger RN, BSN [phone] patient has motorized w/chair and states she uses scat at [employer], the CSW usually calls for her.  Patient has a hospital bed at home and wanted to know about getting another mattress because hers is worn, I asked Larkin Ina with [facility] about this he stated all she needs to do is call [facility] at home and they will switch out the mattress for her we do not need to order a new one.  Patient chose [facility] for [facility] and aid from agency list, referral made to [facility], [place] notified.  Soc will begin 24-48 hrs post discharge.

## 2012-03-20 LAB — BASIC METABOLIC PANEL
CO2: 33 mEq/L — ABNORMAL HIGH (ref 19–32)
Calcium: 7.9 mg/dL — ABNORMAL LOW (ref 8.4–10.5)
Creatinine, Ser: 1.29 mg/dL — ABNORMAL HIGH (ref 0.50–1.10)
Glucose, Bld: 128 mg/dL — ABNORMAL HIGH (ref 70–99)

## 2012-03-20 MED ORDER — GUAIFENESIN-DM 100-10 MG/5ML PO SYRP: 5.0000 mL | ORAL_SOLUTION | Freq: Three times a day (TID) | ORAL | Status: DC | PRN

## 2012-03-20 MED ORDER — HYDROCODONE-ACETAMINOPHEN 10-325 MG PO TABS: 1.0000 | ORAL_TABLET | Freq: Four times a day (QID) | ORAL | Status: DC | PRN

## 2012-03-20 MED ORDER — LEVOFLOXACIN 750 MG PO TABS: 750.0000 mg | ORAL_TABLET | Freq: Every day | ORAL | Status: DC

## 2012-03-20 MED ORDER — PREDNISONE (PAK) 10 MG PO TABS: 10.0000 mg | ORAL_TABLET | Freq: Every day | ORAL | Status: DC

## 2012-03-20 MED ORDER — LEVOFLOXACIN 500 MG PO TABS: 750.0000 mg | ORAL_TABLET | Freq: Every day | ORAL | Status: DC

## 2012-03-20 NOTE — Progress Notes (Signed)
Placed patient on CPAP via auto-mode with minimum pressure of 4cm and maximum of 20cm.Oxygen set at 3lpm with SP02=95%

## 2012-03-20 NOTE — Discharge Summary (Signed)
Physician Discharge Summary  Kara Vincent [id number] DOB: Sep 15, 1965 DOA: 03/17/2012  PCP: Harvie Junior, MD  Admit date: 03/17/2012 Discharge date: 03/20/2012  Time spent: 40 minutes  Recommendations for Outpatient Follow-up:  1. Follow with primary care physician within one week  Discharge Diagnoses:  Principal Problem:  *HCAP (healthcare-associated pneumonia) Active Problems:  HYPERTENSION  Chronic pain  Morbid obesity  Asthma  SOB (shortness of breath)   Discharge Condition: Stable  Diet recommendation: Regular diet  Filed Weights   03/17/12 0500 03/17/12 0544  Weight: 182 kg (401 lb 3.8 oz) 185 kg (407 lb 13.6 oz)    History of present illness:  Kara Vincent is a 46 y.o. female with Multiple Medical Problems who had been seen in the ED on 12/21 and diagnosed with Pneumonia and placed on Levaquin who returns to the ED with complaints of worsening SOB, Cough, and wheezing along with fevers and chills.    Hospital Course:   1. Pneumonia: Righ middle lobe pneumonia, patient failed outpatient antibiotic therapy. Patient came back to the hospital with worsening of her shortness of breath. Patient was placed on vancomycin and Zosyn. Supportive management continued with inhaled bronchodilators, antitussives, mucolytics and oxygen as needed. Her flu PCR is negative. Patient felt better and after 4 days of IV antibiotics patient will be discharged home to complete total of 10 days of antibiotics.  2. Acute asthma exacerbation: Patient had a moderate-to-severe wheezing, probably exacerbated by her pneumonia. Patient was placed on systemic steroids, continued to have inhaled bronchodilators as well. She was switched to prednisone. Her wheezing improved very well, but she continued to complain about, she makes sounds from her upper respiratory tract resembles wheezing, but her lungs is almost clear. She will have prednisone taper on discharge.  3. Hypertension: She  has reasonable control, preadmission home medications were continued throughout the hospital stay.  4. Morbid obesity: Patient weighs 407 pounds, she has BMI of 68. She has recent exacerbation of her morbid obesity she said she used to weigh about 340 pounds. Patient counseled extensively about diet options.  At the time of discharge patient requested prescription for pain medications. 20 pills of Norco 10/325 prescribed.  Procedures:  None  Consultations:  None  Discharge Exam: Filed Vitals:   03/20/12 0405 03/20/12 0524 03/20/12 0751 03/20/12 1152  BP:  137/86  135/88  Pulse: 62 66  65  Temp:  98.3 F (36.8 C)  98 F (36.7 C)  TempSrc:  Oral  Oral  Resp: 18 20  20   Height:      Weight:      SpO2:  95% 95% 94%   General: Alert and awake, oriented x3, not in any acute distress. HEENT: anicteric sclera, pupils reactive to light and accommodation, EOMI CVS: S1-S2 clear, no murmur rubs or gallops Chest: clear to auscultation bilaterally, no wheezing, rales or rhonchi Abdomen: soft nontender, nondistended, normal bowel sounds, no organomegaly Extremities: no cyanosis, clubbing or edema noted bilaterally Neuro: Cranial nerves II-XII intact, no focal neurological deficits  Discharge Instructions  Discharge Orders    Future Orders Please Complete By Expires   Increase activity slowly          Medication List     As of 03/20/2012 12:40 PM    STOP taking these medications         clarithromycin 500 MG tablet   Commonly known as: BIAXIN      oxyCODONE-acetaminophen 5-325 MG per tablet   Commonly known as: PERCOCET/ROXICET  TAKE these medications         amitriptyline 75 MG tablet   Commonly known as: ELAVIL   Take 75 mg by mouth at bedtime.      ammonium lactate 12 % cream   Commonly known as: AMLACTIN   Apply 1 g topically as needed. Applies to back and legs.Marland KitchenMarland KitchenIn a thin layer on affected area for dry patches.      aspirin 325 MG tablet   Take 325 mg by  mouth daily.      atorvastatin 20 MG tablet   Commonly known as: LIPITOR   Take 20 mg by mouth daily.      clonazePAM 1 MG tablet   Commonly known as: KLONOPIN   Take 1 mg by mouth 3 (three) times daily as needed. For anxiety      cloNIDine 0.2 MG tablet   Commonly known as: CATAPRES   Take 0.2 mg by mouth 2 (two) times daily.      diphenhydrAMINE 25 mg capsule   Commonly known as: BENADRYL   Take 25 mg by mouth every 6 (six) hours as needed. For itching      eucerin lotion   Apply topically 2 (two) times daily as needed. On affected areas for dry patches.      famotidine 20 MG tablet   Commonly known as: PEPCID   Take 1 tablet (20 mg total) by mouth 2 (two) times daily.      furosemide 40 MG tablet   Commonly known as: LASIX   Take 1 tablet (40 mg total) by mouth daily.      guaiFENesin-dextromethorphan 100-10 MG/5ML syrup   Commonly known as: ROBITUSSIN DM   Take 5 mLs by mouth 3 (three) times daily as needed for cough.      HYDROcodone-acetaminophen 10-325 MG per tablet   Commonly known as: NORCO   Take 1 tablet by mouth every 6 (six) hours as needed. For pain      ketoconazole 2 % cream   Commonly known as: NIZORAL   Apply topically daily.      ketoconazole 2 % shampoo   Commonly known as: NIZORAL   Apply 1 application topically 2 (two) times a week.      ketoconazole 200 MG tablet   Commonly known as: NIZORAL   Take 200 mg by mouth daily.      levofloxacin 500 MG tablet   Commonly known as: LEVAQUIN   Take 1.5 tablets (750 mg total) by mouth daily.      lubiprostone 24 MCG capsule   Commonly known as: AMITIZA   Take 24 mcg by mouth every evening.      pantoprazole 40 MG tablet   Commonly known as: PROTONIX   Take 40 mg by mouth daily.      predniSONE 10 MG tablet   Commonly known as: STERAPRED UNI-PAK   Take 1 tablet (10 mg total) by mouth daily. Take 6-5-4-3-2-1 tablets orally till gone.      promethazine 25 MG tablet   Commonly known as:  PHENERGAN   Take 25 mg by mouth every 6 (six) hours as needed. For nausea/vomiting      PROVENTIL HFA 108 (90 BASE) MCG/ACT inhaler   Generic drug: albuterol   Inhale 2 puffs into the lungs every 4 (four) hours as needed. For asthma relief      albuterol (2.5 MG/3ML) 0.083% nebulizer solution   Commonly known as: PROVENTIL   Take 3 mLs (2.5 mg total)  by nebulization every 6 (six) hours as needed for wheezing.      solifenacin 5 MG tablet   Commonly known as: VESICARE   Take 5 mg by mouth 2 (two) times daily.            Follow-up Information    Follow up with Harvie Junior, MD. In 1 week.   Contact information:   [address] [place] [address] [phone]           The results of significant diagnostics from this hospitalization (including imaging, microbiology, ancillary and laboratory) are listed below for reference.    Significant Diagnostic Studies: Dg Chest 2 View  03/16/2012  *RADIOLOGY REPORT*  Clinical Data: Shortness of breath and cough.  CHEST - 2 VIEW  Comparison: Chest radiograph and CT of the chest performed 03/14/2012  Findings: The lungs are relatively well-aerated.  Mildly worsened right upper and middle lobe airspace opacities are again noted, concerning for pneumonia.  Underlying vascular congestion is seen. No definite pleural effusion or pneumothorax is identified.  The heart is borderline normal in size; the mediastinal contour is within normal limits.  No acute osseous abnormalities are seen.  IMPRESSION: Mildly worsened right upper and middle lobe airspace opacities, concerning for mildly worsened pneumonia.  Underlying vascular congestion noted.   Original Report Authenticated By: Santa Lighter, M.D.    Dg Chest 2 View  03/14/2012  *RADIOLOGY REPORT*  Clinical Data: Cough and congestion.  CHEST - 2 VIEW  Comparison: 03/12/2012  Findings: Two views of the chest again demonstrate low lung volumes.  There are persistent densities in the right  hilum and right lung base.  Heart size remains enlarged.  Trachea is midline. Bony thorax is intact.  IMPRESSION: Minimal change since the previous examination.  Again noted are densities in the right hilum and right lower lung region. Findings could be related to areas of volume loss.  An infectious etiology cannot be excluded.   Original Report Authenticated By: Markus Daft, M.D.    Dg Chest 2 View  03/12/2012  *RADIOLOGY REPORT*  Clinical Data: Shortness of breath, cough  CHEST - 2 VIEW  Comparison: 09/07/2011  Findings: Mild patchy right upper lobe opacity, possibly reflecting pneumonia. No pleural effusion or pneumothorax.  Cardiomegaly.  Degenerative changes of the visualized thoracolumbar spine.  IMPRESSION: Mild patchy right upper lobe opacity, possibly reflecting pneumonia.   Original Report Authenticated By: Julian Hy, M.D.    Ct Chest W Contrast  03/14/2012  *RADIOLOGY REPORT*  Clinical Data: Shortness of breath  CT CHEST WITH CONTRAST  Technique:  Multidetector CT imaging of the chest was performed following the standard protocol during bolus administration of intravenous contrast.  Contrast: 19mL OMNIPAQUE IOHEXOL 300 MG/ML  SOLN  Comparison: 01/15/2011  Findings: Degraded by patient body habitus and respiratory motion.  Right upper and middle lobe airspace opacity and to a lesser extent, lingula. No pneumothorax.  Normal caliber aorta.  No main branch pulmonary arterial filling defect.  This exam is not optimized to evaluate the pulmonary vasculature.  Normal heart size.  No pleural or pericardial effusion. Mildly prominent right paratracheal and right hilar lymph nodes.  Limited images through the upper abdomen show no acute finding.  No acute osseous finding, multilevel degenerative change.  IMPRESSION: Right upper and middle lobe airspace opacities and to a lesser extent lingular opacity, atelectasis versus pneumonia.  Recommend radiograph follow-up after therapy to document  resolution.   Original Report Authenticated By: Carlos Levering, M.D.  Microbiology: Recent Results (from the past 240 hour(s))  CULTURE, BLOOD (ROUTINE X 2)     Status: Normal (Preliminary result)   Collection Time   03/17/12  1:35 AM      Component Value Range Status Comment   Specimen Description BLOOD RIGHT ARM   Final    Special Requests BOTTLES DRAWN AEROBIC AND ANAEROBIC 10CC EACH   Final    Culture  Setup Time 03/17/2012 09:48   Final    Culture     Final    Value:        BLOOD CULTURE RECEIVED NO GROWTH TO DATE CULTURE WILL BE HELD FOR 5 DAYS BEFORE ISSUING A FINAL NEGATIVE REPORT   Report Status PENDING   Incomplete   CULTURE, BLOOD (ROUTINE X 2)     Status: Normal (Preliminary result)   Collection Time   03/17/12  1:40 AM      Component Value Range Status Comment   Specimen Description BLOOD RIGHT ARM   Final    Special Requests BOTTLES DRAWN AEROBIC AND ANAEROBIC 5CC EACH   Final    Culture  Setup Time 03/17/2012 09:48   Final    Culture     Final    Value:        BLOOD CULTURE RECEIVED NO GROWTH TO DATE CULTURE WILL BE HELD FOR 5 DAYS BEFORE ISSUING A FINAL NEGATIVE REPORT   Report Status PENDING   Incomplete   MRSA PCR SCREENING     Status: Abnormal   Collection Time   03/17/12  5:46 AM      Component Value Range Status Comment   MRSA by PCR POSITIVE (*) NEGATIVE Final      Labs: Basic Metabolic Panel:  Lab [id number] 03/19/12 0912 03/18/12 0622 03/17/12 0837 03/17/12 0126  NA 140 142 144 141 144  K 4.2 4.6 4.4 4.4 3.5  CL 100 104 104 103 101  CO2 33* 30 31 29  32  GLUCOSE 128* 151* 186* 181* 102*  BUN 25* 22 19 14 15   CREATININE 1.29* 1.09 1.29* 1.11* 1.15*  CALCIUM 7.9* 8.3* 8.1* 8.8 9.1  MG -- -- -- -- --  PHOS -- -- -- -- --   Liver Function Tests:  Lab 03/17/12 0126  AST 13  ALT 11  ALKPHOS 62  BILITOT 0.3  PROT 6.9  ALBUMIN 3.4*   No results found for this basename: LIPASE:5,AMYLASE:5 in the last 168 hours No results found for this  basename: AMMONIA:5 in the last 168 hours CBC:  Lab 03/19/12 0912 03/18/12 0622 03/17/12 0837 03/17/12 0126 03/14/12 0122  WBC 11.8* 12.6* 8.2 8.2 9.5  NEUTROABS -- -- -- 5.8 6.6  HGB 11.4* 11.0* 11.9* 11.8* 11.7*  HCT 37.8 36.4 39.2 37.2 37.2  MCV 99.7 97.8 96.1 96.4 93.7  PLT 273 276 278 286 279   Cardiac Enzymes:  Lab 03/17/12 0135  CKTOTAL --  CKMB --  CKMBINDEX --  TROPONINI <0.30   BNP: BNP (last 3 results)  Basename 03/17/12 0135 09/03/11 2200  PROBNP 514.3* 67.8   CBG: No results found for this basename: GLUCAP:5 in the last 168 hours     Signed:  Zaydee Aina A  [facility] 03/20/2012, 12:20 PM

## 2012-03-20 NOTE — Progress Notes (Signed)
Pt called for something for pain- within six minutes patient was back asleep. Pain med not given

## 2012-03-21 MED ORDER — LEVOFLOXACIN 750 MG PO TABS
750.0000 mg | ORAL_TABLET | Freq: Every day | ORAL | Status: DC
  Administered 2012-03-21: 750 mg via ORAL
  Filled 2012-03-21 (×2): qty 1

## 2012-03-21 NOTE — Clinical Social Work Note (Addendum)
CSW consulted re: transportation home. CSW contacted [facility] who stated she was not a client. CSW provided the client with a [employer] application for the future, and a city bus pass. Per chart review the patient is experienced taking the city bus to get her to the ED. CSW signing off, no other psychosocial concerns identified. Please re-consult as needed.  Per cCaitlin Charlyne Quale, [place] Worker Contact #: [phone] (weekend)

## 2012-03-21 NOTE — Progress Notes (Signed)
NURSING PROGRESS NOTE  Kara Vincent [id number] Discharge Data: 03/21/2012 3:16 PM Attending Provider: Verlee Monte, MD [id number] [name], MD     Beverley Fiedler Closs to be D/C'd Home per MD order.  Discussed with the patient the After Visit Summary and all questions fully answered. All IV's discontinued with no bleeding noted. All belongings returned to patient for patient to take home.   Last Vital Signs:  Blood pressure 128/90, pulse 72, temperature 98 F (36.7 C), temperature source Oral, resp. rate 20, height 5\' 5"  (1.651 m), weight 185 kg (407 lb 13.6 oz), last menstrual period 03/24/2009, SpO2 94.00%.  Discharge Medication List   Medication List     As of 03/21/2012  3:16 PM    STOP taking these medications         clarithromycin 500 MG tablet   Commonly known as: BIAXIN      oxyCODONE-acetaminophen 5-325 MG per tablet   Commonly known as: PERCOCET/ROXICET      TAKE these medications         amitriptyline 75 MG tablet   Commonly known as: ELAVIL   Take 75 mg by mouth at bedtime.      ammonium lactate 12 % cream   Commonly known as: AMLACTIN   Apply 1 g topically as needed. Applies to back and legs.Marland KitchenMarland KitchenIn a thin layer on affected area for dry patches.      aspirin 325 MG tablet   Take 325 mg by mouth daily.      atorvastatin 20 MG tablet   Commonly known as: LIPITOR   Take 20 mg by mouth daily.      clonazePAM 1 MG tablet   Commonly known as: KLONOPIN   Take 1 mg by mouth 3 (three) times daily as needed. For anxiety      cloNIDine 0.2 MG tablet   Commonly known as: CATAPRES   Take 0.2 mg by mouth 2 (two) times daily.      diphenhydrAMINE 25 mg capsule   Commonly known as: BENADRYL   Take 25 mg by mouth every 6 (six) hours as needed. For itching      eucerin lotion   Apply topically 2 (two) times daily as needed. On affected areas for dry patches.      famotidine 20 MG tablet   Commonly known as: PEPCID   Take 1 tablet (20 mg total) by mouth 2  (two) times daily.      furosemide 40 MG tablet   Commonly known as: LASIX   Take 1 tablet (40 mg total) by mouth daily.      guaiFENesin-dextromethorphan 100-10 MG/5ML syrup   Commonly known as: ROBITUSSIN DM   Take 5 mLs by mouth 3 (three) times daily as needed for cough.      HYDROcodone-acetaminophen 10-325 MG per tablet   Commonly known as: NORCO   Take 1 tablet by mouth every 6 (six) hours as needed. For pain      ketoconazole 2 % cream   Commonly known as: NIZORAL   Apply topically daily.      ketoconazole 2 % shampoo   Commonly known as: NIZORAL   Apply 1 application topically 2 (two) times a week.      ketoconazole 200 MG tablet   Commonly known as: NIZORAL   Take 200 mg by mouth daily.      levofloxacin 750 MG tablet   Commonly known as: LEVAQUIN   Take 1 tablet (750 mg total) by mouth daily.  lubiprostone 24 MCG capsule   Commonly known as: AMITIZA   Take 24 mcg by mouth every evening.      pantoprazole 40 MG tablet   Commonly known as: PROTONIX   Take 40 mg by mouth daily.      predniSONE 10 MG tablet   Commonly known as: STERAPRED UNI-PAK   Take 1 tablet (10 mg total) by mouth daily. Take 6-5-4-3-2-1 tablets orally till gone.      promethazine 25 MG tablet   Commonly known as: PHENERGAN   Take 25 mg by mouth every 6 (six) hours as needed. For nausea/vomiting      PROVENTIL HFA 108 (90 BASE) MCG/ACT inhaler   Generic drug: albuterol   Inhale 2 puffs into the lungs every 4 (four) hours as needed. For asthma relief      albuterol (2.5 MG/3ML) 0.083% nebulizer solution   Commonly known as: PROVENTIL   Take 3 mLs (2.5 mg total) by nebulization every 6 (six) hours as needed for wheezing.      solifenacin 5 MG tablet   Commonly known as: VESICARE   Take 5 mg by mouth 2 (two) times daily.        Edge Mauger, Rodrigo Ran, RN

## 2012-03-21 NOTE — Progress Notes (Signed)
Patient getting up to restroom by herself and taking off her oxygen to do so. Gets back to bed by herself with no distress noted. Informed the patient she needed to wear her oxygen at all times, but she continued to be non compliant.

## 2012-03-21 NOTE — Progress Notes (Signed)
   Subjective: Patient is sleeping soundly as soon as entered the room she was trying to make herself appears wheezy. She is making stridorous upper respiratory sound, her lungs has very faint expiratory wheezes   Objective: General: Alert and awake, oriented x3, not in any acute distress. HEENT: anicteric sclera, pupils reactive to light and accommodation, EOMI CVS: S1-S2 clear, no murmur rubs or gallops Chest: There is transferred upper respiratory sounds (stridorous sound), very faint expiratory wheezes Abdomen: soft nontender, nondistended, normal bowel sounds, no organomegaly Extremities: no cyanosis, clubbing or edema noted bilaterally Neuro: Cranial nerves II-XII intact, no focal neurological deficits   Assessment and plan:  HCAP: On Zosyn and vancomycin for the past 4 days, changed to Levaquin.  Asthma exacerbation: Continue inhalers, bronchodilators and steroids.  HTN: Continue preadmission home medications.  Morbid obesity: Counseled about diet.  Disposition: Patient for discharge to home today, home health RN and aide will be sent.  Birdie Hopes [phone] 03/21/2012, 11:45 AM

## 2012-03-21 NOTE — Progress Notes (Signed)
03/21/2012 1530 NCM faxed facesheet and orders to [facility] for scheduled d/c home today. [facility] contact info added to d/c instructions.  Jonnie Finner RN CCM Case Mgmt phone [phone]

## 2012-03-21 NOTE — Progress Notes (Signed)
Patient sleepy this morning. Does not have audible wheezing while sleeping, but as soon as she awakes she appears to be forcing herself to sound wheezy. This has been going on for the past 3 days that I have had this patient. As long as she is asleep or thinks no one is around she is not audibly wheezing. When you enter the room or wake her she starts wheezing.

## 2012-03-22 ENCOUNTER — Emergency Department (HOSPITAL_COMMUNITY): Payer: Medicaid Other

## 2012-03-22 ENCOUNTER — Observation Stay (HOSPITAL_COMMUNITY)
Admission: EM | Admit: 2012-03-22 | Discharge: 2012-03-23 | Disposition: A | Discharge status: Home / Self Care | Payer: Medicaid Other | Attending: Internal Medicine | Admitting: Internal Medicine

## 2012-03-22 ENCOUNTER — Encounter (HOSPITAL_COMMUNITY): Payer: Self-pay | Admitting: Emergency Medicine

## 2012-03-22 DIAGNOSIS — Z9981 Dependence on supplemental oxygen: Secondary | ICD-10-CM | POA: Insufficient documentation

## 2012-03-22 DIAGNOSIS — E860 Dehydration: Secondary | ICD-10-CM | POA: Diagnosis present

## 2012-03-22 DIAGNOSIS — Z5900 Homelessness unspecified: Secondary | ICD-10-CM | POA: Insufficient documentation

## 2012-03-22 DIAGNOSIS — I10 Essential (primary) hypertension: Secondary | ICD-10-CM | POA: Diagnosis present

## 2012-03-22 DIAGNOSIS — N183 Chronic kidney disease, stage 3 unspecified: Secondary | ICD-10-CM | POA: Insufficient documentation

## 2012-03-22 DIAGNOSIS — G8929 Other chronic pain: Secondary | ICD-10-CM

## 2012-03-22 DIAGNOSIS — J45909 Unspecified asthma, uncomplicated: Secondary | ICD-10-CM | POA: Diagnosis present

## 2012-03-22 DIAGNOSIS — Z79899 Other long term (current) drug therapy: Secondary | ICD-10-CM | POA: Insufficient documentation

## 2012-03-22 DIAGNOSIS — Z9119 Patient's noncompliance with other medical treatment and regimen: Secondary | ICD-10-CM | POA: Insufficient documentation

## 2012-03-22 DIAGNOSIS — Z8614 Personal history of Methicillin resistant Staphylococcus aureus infection: Secondary | ICD-10-CM | POA: Insufficient documentation

## 2012-03-22 DIAGNOSIS — G4733 Obstructive sleep apnea (adult) (pediatric): Secondary | ICD-10-CM | POA: Diagnosis present

## 2012-03-22 DIAGNOSIS — J45901 Unspecified asthma with (acute) exacerbation: Secondary | ICD-10-CM | POA: Insufficient documentation

## 2012-03-22 DIAGNOSIS — I129 Hypertensive chronic kidney disease with stage 1 through stage 4 chronic kidney disease, or unspecified chronic kidney disease: Secondary | ICD-10-CM | POA: Insufficient documentation

## 2012-03-22 DIAGNOSIS — E662 Morbid (severe) obesity with alveolar hypoventilation: Secondary | ICD-10-CM | POA: Diagnosis present

## 2012-03-22 DIAGNOSIS — Z87891 Personal history of nicotine dependence: Secondary | ICD-10-CM | POA: Insufficient documentation

## 2012-03-22 DIAGNOSIS — R609 Edema, unspecified: Secondary | ICD-10-CM | POA: Insufficient documentation

## 2012-03-22 DIAGNOSIS — J189 Pneumonia, unspecified organism: Principal | ICD-10-CM

## 2012-03-22 DIAGNOSIS — Z993 Dependence on wheelchair: Secondary | ICD-10-CM | POA: Insufficient documentation

## 2012-03-22 DIAGNOSIS — Z91199 Patient's noncompliance with other medical treatment and regimen due to unspecified reason: Secondary | ICD-10-CM | POA: Insufficient documentation

## 2012-03-22 NOTE — ED Notes (Signed)
PT. REPORTS PERSISTENT PRODUCTIVE COUGH WITH SOB FOR SEVERAL WEEKS , SEEN HERE LAST WEEK DIAGNOSED WITH PNEUMONIA .

## 2012-03-23 DIAGNOSIS — G8929 Other chronic pain: Secondary | ICD-10-CM

## 2012-03-23 LAB — COMPREHENSIVE METABOLIC PANEL
AST: 12 U/L (ref 0–37)
Alkaline Phosphatase: 51 U/L (ref 39–117)
BUN: 26 mg/dL — ABNORMAL HIGH (ref 6–23)
CO2: 37 mEq/L — ABNORMAL HIGH (ref 19–32)
Chloride: 98 mEq/L (ref 96–112)
Creatinine, Ser: 1.26 mg/dL — ABNORMAL HIGH (ref 0.50–1.10)
GFR calc non Af Amer: 50 mL/min — ABNORMAL LOW (ref >90)
Total Bilirubin: 0.3 mg/dL (ref 0.3–1.2)

## 2012-03-23 LAB — CBC
HCT: 37 % (ref 36.0–46.0)
Hemoglobin: 11.3 g/dL — ABNORMAL LOW (ref 12.0–15.0)
MCH: 29.9 pg (ref 26.0–34.0)
MCHC: 30.5 g/dL (ref 30.0–36.0)
MCV: 97.9 fL (ref 78.0–100.0)
RBC: 3.78 MIL/uL — ABNORMAL LOW (ref 3.87–5.11)

## 2012-03-23 LAB — POCT I-STAT, CHEM 8
BUN: 27 mg/dL — ABNORMAL HIGH (ref 6–23)
Calcium, Ion: 1.02 mmol/L — ABNORMAL LOW (ref 1.12–1.23)
HCT: 34 % — ABNORMAL LOW (ref 36.0–46.0)
Hemoglobin: 11.6 g/dL — ABNORMAL LOW (ref 12.0–15.0)
Sodium: 143 mEq/L (ref 135–145)
TCO2: 37 mmol/L (ref 0–100)

## 2012-03-23 LAB — CG4 I-STAT (LACTIC ACID): Lactic Acid, Venous: 1.01 mmol/L (ref 0.5–2.2)

## 2012-03-23 LAB — CBC WITH DIFFERENTIAL/PLATELET
Basophils Absolute: 0 10*3/uL (ref 0.0–0.1)
Basophils Relative: 0 % (ref 0–1)
Eosinophils Relative: 3 % (ref 0–5)
Lymphocytes Relative: 16 % (ref 12–46)
MCHC: 30.6 g/dL (ref 30.0–36.0)
MCV: 96.6 fL (ref 78.0–100.0)
Platelets: 271 10*3/uL (ref 150–400)
RDW: 15.1 % (ref 11.5–15.5)
WBC: 11.7 10*3/uL — ABNORMAL HIGH (ref 4.0–10.5)

## 2012-03-23 LAB — CULTURE, BLOOD (ROUTINE X 2)
Culture: NO GROWTH
Culture: NO GROWTH

## 2012-03-23 LAB — BASIC METABOLIC PANEL
BUN: 24 mg/dL — ABNORMAL HIGH (ref 6–23)
CO2: 32 mEq/L (ref 19–32)
Chloride: 97 mEq/L (ref 96–112)
Glucose, Bld: 180 mg/dL — ABNORMAL HIGH (ref 70–99)
Potassium: 3.9 mEq/L (ref 3.5–5.1)

## 2012-03-23 LAB — STREP PNEUMONIAE URINARY ANTIGEN: Strep Pneumo Urinary Antigen: NEGATIVE

## 2012-03-23 LAB — LEGIONELLA ANTIGEN, URINE

## 2012-03-23 LAB — PRO B NATRIURETIC PEPTIDE: Pro B Natriuretic peptide (BNP): 133.5 pg/mL — ABNORMAL HIGH (ref 0–125)

## 2012-03-23 MED ORDER — ONDANSETRON HCL 4 MG PO TABS: 4.0000 mg | ORAL_TABLET | Freq: Four times a day (QID) | ORAL | Status: DC | PRN

## 2012-03-23 MED ORDER — PIPERACILLIN-TAZOBACTAM 3.375 G IVPB
3.3750 g | Freq: Three times a day (TID) | INTRAVENOUS | Status: DC
  Administered 2012-03-23: 3.375 g via INTRAVENOUS
  Filled 2012-03-23 (×4): qty 50

## 2012-03-23 MED ORDER — GUAIFENESIN-DM 100-10 MG/5ML PO SYRP
5.0000 mL | ORAL_SOLUTION | Freq: Three times a day (TID) | ORAL | Status: DC | PRN
  Administered 2012-03-23: 5 mL via ORAL
  Filled 2012-03-23: qty 5

## 2012-03-23 MED ORDER — SODIUM CHLORIDE 0.9 % IV SOLN
INTRAVENOUS | Status: DC
  Administered 2012-03-23: 03:00:00 via INTRAVENOUS

## 2012-03-23 MED ORDER — AMITRIPTYLINE HCL 75 MG PO TABS
75.0000 mg | ORAL_TABLET | Freq: Every day | ORAL | Status: DC
  Filled 2012-03-23: qty 1

## 2012-03-23 MED ORDER — METHYLPREDNISOLONE SODIUM SUCC 125 MG IJ SOLR
125.0000 mg | Freq: Two times a day (BID) | INTRAMUSCULAR | Status: DC
  Administered 2012-03-23: 125 mg via INTRAVENOUS
  Filled 2012-03-23 (×3): qty 2

## 2012-03-23 MED ORDER — OXYCODONE-ACETAMINOPHEN 5-325 MG PO TABS
1.0000 | ORAL_TABLET | Freq: Once | ORAL | Status: DC
  Filled 2012-03-23 (×2): qty 1

## 2012-03-23 MED ORDER — VANCOMYCIN HCL 10 G IV SOLR
1500.0000 mg | Freq: Once | INTRAVENOUS | Status: AC
  Administered 2012-03-23: 1500 mg via INTRAVENOUS
  Filled 2012-03-23: qty 1500

## 2012-03-23 MED ORDER — ALBUTEROL (5 MG/ML) CONTINUOUS INHALATION SOLN
10.0000 mg/h | INHALATION_SOLUTION | Freq: Once | RESPIRATORY_TRACT | Status: AC
  Administered 2012-03-23: 10 mg/h via RESPIRATORY_TRACT
  Filled 2012-03-23: qty 20

## 2012-03-23 MED ORDER — DOCUSATE SODIUM 100 MG PO CAPS
100.0000 mg | ORAL_CAPSULE | Freq: Two times a day (BID) | ORAL | Status: DC
  Administered 2012-03-23: 100 mg via ORAL
  Filled 2012-03-23 (×2): qty 1

## 2012-03-23 MED ORDER — VANCOMYCIN HCL 10 G IV SOLR
1500.0000 mg | Freq: Two times a day (BID) | INTRAVENOUS | Status: DC
  Filled 2012-03-23 (×2): qty 1500

## 2012-03-23 MED ORDER — LEVOFLOXACIN 750 MG PO TABS: 750.0000 mg | ORAL_TABLET | Freq: Every day | ORAL | Status: DC

## 2012-03-23 MED ORDER — PIPERACILLIN-TAZOBACTAM 3.375 G IVPB
3.3750 g | Freq: Once | INTRAVENOUS | Status: AC
  Administered 2012-03-23: 3.375 g via INTRAVENOUS
  Filled 2012-03-23: qty 50

## 2012-03-23 MED ORDER — AMITRIPTYLINE HCL 75 MG PO TABS: 75.0000 mg | ORAL_TABLET | Freq: Every day | ORAL | Status: DC

## 2012-03-23 MED ORDER — ENOXAPARIN SODIUM 100 MG/ML ~~LOC~~ SOLN
100.0000 mg | Freq: Every day | SUBCUTANEOUS | Status: DC
  Filled 2012-03-23: qty 1

## 2012-03-23 MED ORDER — ASPIRIN 325 MG PO TABS
325.0000 mg | ORAL_TABLET | Freq: Every day | ORAL | Status: DC
  Administered 2012-03-23: 325 mg via ORAL
  Filled 2012-03-23: qty 1

## 2012-03-23 MED ORDER — CLONIDINE HCL 0.2 MG PO TABS
0.2000 mg | ORAL_TABLET | Freq: Two times a day (BID) | ORAL | Status: DC
  Administered 2012-03-23: 0.2 mg via ORAL
  Filled 2012-03-23 (×2): qty 1

## 2012-03-23 MED ORDER — DIPHENHYDRAMINE HCL 25 MG PO CAPS
25.0000 mg | ORAL_CAPSULE | Freq: Four times a day (QID) | ORAL | Status: DC | PRN
  Administered 2012-03-23: 25 mg via ORAL
  Filled 2012-03-23: qty 1

## 2012-03-23 MED ORDER — PANTOPRAZOLE SODIUM 40 MG PO TBEC
40.0000 mg | DELAYED_RELEASE_TABLET | Freq: Every day | ORAL | Status: DC
Start: 2012-03-23 — End: 2012-03-23
  Administered 2012-03-23: 40 mg via ORAL
  Filled 2012-03-23: qty 1

## 2012-03-23 MED ORDER — ALPRAZOLAM 1 MG PO TABS: 1.0000 mg | ORAL_TABLET | Freq: Every evening | ORAL | Status: DC | PRN

## 2012-03-23 MED ORDER — MUPIROCIN 2 % EX OINT
1.0000 "application " | TOPICAL_OINTMENT | Freq: Two times a day (BID) | CUTANEOUS | Status: DC
  Administered 2012-03-23: 1 via NASAL
  Filled 2012-03-23: qty 22

## 2012-03-23 MED ORDER — IPRATROPIUM BROMIDE 0.02 % IN SOLN: 0.5000 mg | RESPIRATORY_TRACT | Status: DC

## 2012-03-23 MED ORDER — ALBUTEROL SULFATE (5 MG/ML) 0.5% IN NEBU
2.5000 mg | INHALATION_SOLUTION | RESPIRATORY_TRACT | Status: DC
  Administered 2012-03-23 (×3): 2.5 mg via RESPIRATORY_TRACT
  Filled 2012-03-23 (×3): qty 0.5

## 2012-03-23 MED ORDER — CHLORHEXIDINE GLUCONATE CLOTH 2 % EX PADS
6.0000 | MEDICATED_PAD | Freq: Every day | CUTANEOUS | Status: DC
  Administered 2012-03-23: 6 via TOPICAL

## 2012-03-23 MED ORDER — SODIUM CHLORIDE 0.9 % IV SOLN: INTRAVENOUS | Status: DC

## 2012-03-23 MED ORDER — ALPRAZOLAM 0.5 MG PO TABS: 1.0000 mg | ORAL_TABLET | Freq: Every evening | ORAL | Status: DC | PRN

## 2012-03-23 MED ORDER — FAMOTIDINE 20 MG PO TABS
20.0000 mg | ORAL_TABLET | Freq: Two times a day (BID) | ORAL | Status: DC
  Administered 2012-03-23: 20 mg via ORAL
  Filled 2012-03-23 (×2): qty 1

## 2012-03-23 MED ORDER — HYDROCODONE-ACETAMINOPHEN 10-325 MG PO TABS
1.0000 | ORAL_TABLET | Freq: Four times a day (QID) | ORAL | Status: DC | PRN
  Administered 2012-03-23 (×2): 1 via ORAL
  Filled 2012-03-23 (×2): qty 1

## 2012-03-23 MED ORDER — ONDANSETRON HCL 4 MG/2ML IJ SOLN: 4.0000 mg | Freq: Four times a day (QID) | INTRAMUSCULAR | Status: DC | PRN

## 2012-03-23 MED ORDER — ALBUTEROL SULFATE (5 MG/ML) 0.5% IN NEBU: 2.5000 mg | INHALATION_SOLUTION | RESPIRATORY_TRACT | Status: DC

## 2012-03-23 MED ORDER — ENOXAPARIN SODIUM 40 MG/0.4ML ~~LOC~~ SOLN
40.0000 mg | SUBCUTANEOUS | Status: DC
  Filled 2012-03-23: qty 0.4

## 2012-03-23 MED ORDER — ONDANSETRON HCL 4 MG/2ML IJ SOLN: 4.0000 mg | Freq: Three times a day (TID) | INTRAMUSCULAR | Status: AC | PRN

## 2012-03-23 MED ORDER — LUBIPROSTONE 24 MCG PO CAPS
24.0000 ug | ORAL_CAPSULE | Freq: Every evening | ORAL | Status: DC
  Filled 2012-03-23: qty 1

## 2012-03-23 MED ORDER — VANCOMYCIN HCL IN DEXTROSE 1-5 GM/200ML-% IV SOLN
1000.0000 mg | Freq: Once | INTRAVENOUS | Status: AC
  Administered 2012-03-23: 1000 mg via INTRAVENOUS
  Filled 2012-03-23: qty 200

## 2012-03-23 MED ORDER — IPRATROPIUM BROMIDE 0.02 % IN SOLN
0.5000 mg | RESPIRATORY_TRACT | Status: DC
  Administered 2012-03-23 (×3): 0.5 mg via RESPIRATORY_TRACT
  Filled 2012-03-23 (×3): qty 2.5

## 2012-03-23 MED ORDER — SENNA 8.6 MG PO TABS
1.0000 | ORAL_TABLET | Freq: Two times a day (BID) | ORAL | Status: DC
  Administered 2012-03-23: 8.6 mg via ORAL
  Filled 2012-03-23 (×2): qty 1

## 2012-03-23 MED ORDER — ATORVASTATIN CALCIUM 20 MG PO TABS
20.0000 mg | ORAL_TABLET | Freq: Every day | ORAL | Status: DC
  Filled 2012-03-23: qty 1

## 2012-03-23 MED ORDER — HYDROCODONE-ACETAMINOPHEN 10-325 MG PO TABS: 1.0000 | ORAL_TABLET | Freq: Four times a day (QID) | ORAL | Status: DC | PRN

## 2012-03-23 NOTE — Evaluation (Addendum)
Physical Therapy Evaluation Patient Details Name: Kara Vincent MRN: [id number] DOB: 12/15/1965 Today's Date: 03/23/2012 Time: [id number] PT Time Calculation (min): 17 min  PT Assessment / Plan / Recommendation Clinical Impression  Pt readm with PNA.  Pt I with mobility.  Fatigues with activity which is baseline for her probably primarily due to body habitus. No further PT needed.    PT Assessment  Patent does not need any further PT services    Follow Up Recommendations  No PT follow up    Does the patient have the potential to tolerate intense rehabilitation      Barriers to Discharge        Equipment Recommendations  None recommended by PT    Recommendations for Other Services     Frequency      Precautions / Restrictions Precautions Precautions: Fall   Pertinent Vitals/Pain SaO2 88% on RA with activity.      Mobility  Transfers Transfers: Sit to Stand;Stand to Sit Sit to Stand: 7: Independent;From bed;With upper extremity assist Stand to Sit: 7: Independent;With upper extremity assist;To bed Ambulation/Gait Ambulation/Gait Assistance: 7: Independent Ambulation Distance (Feet): 25 Feet Assistive device: None Ambulation/Gait Assistance Details: Distance limited by low endurance (baseline) Gait Pattern: Wide base of support Gait velocity: decr    Shoulder Instructions     Exercises     PT Diagnosis:    PT Problem List:   PT Treatment Interventions:     PT Goals    Visit Information  Last PT Received On: 03/23/12 Assistance Needed: +1    Subjective Data  Subjective: "Can I get something to drink?"  pt requesting more to drink.   Prior Functioning  Home Living Lives With: Alone Type of Home: Apartment Home Access: Level entry;Elevator Home Adaptive Equipment: Wheelchair - powered Prior Function Level of Independence: Independent with assistive device(s) Vocation: On disability Comments: Uses motorized chair for distance.  Amb short  distances. Communication Communication: No difficulties    Cognition  Overall Cognitive Status: Appears within functional limits for tasks assessed/performed Arousal/Alertness: Awake/alert Orientation Level: Appears intact for tasks assessed Behavior During Session: [facility] for tasks performed    Extremity/Trunk Assessment Right Lower Extremity Assessment RLE ROM/Strength/Tone: [facility] for tasks assessed Left Lower Extremity Assessment LLE ROM/Strength/Tone: [facility] for tasks assessed   Balance Dynamic Standing Balance Dynamic Standing - Balance Support: No upper extremity supported;During functional activity Dynamic Standing - Level of Assistance: 7: Independent  End of Session PT - End of Session Activity Tolerance: Patient tolerated treatment well Patient left: in bed;with call bell/phone within reach Nurse Communication: Mobility status  GP Functional Assessment Tool Used: clinical judgement Functional Limitation: Mobility: Walking and moving around Mobility: Walking and Moving Around Current Status [id number]): 0 percent impaired, limited or restricted Mobility: Walking and Moving Around Goal Status [id number]): 0 percent impaired, limited or restricted Mobility: Walking and Moving Around Discharge Status [phone]): 0 percent impaired, limited or restricted   [facility] 03/23/2012, 12:13 PM  [facility] PT [phone]

## 2012-03-23 NOTE — Care Management Note (Signed)
    Page 1 of 2   03/23/2012     4:05:57 PM   CARE MANAGEMENT NOTE 03/23/2012  Patient:  Kara Vincent, Kara Vincent   Account Number:  [id number]  Date Initiated:  03/23/2012  Documentation initiated by:  Tomi Bamberger  Subjective/Objective Assessment:   dx pna  admit as observation. pt recently dc on 12/29. Active with [facility] for [facility] /aide.     Action/Plan:   Anticipated DC Date:  03/23/2012   Anticipated DC Plan:  [place]  CM consult      [facility] Choice  HOME HEALTH  Resumption Of Svcs/PTA Provider   Choice offered to / List presented to:  C-1 Patient        [place] arranged  HH-1 RN  [place].   Status of service:  Completed, signed off Medicare Important Message given?   (If response is "NO", the following Medicare IM given date fields will be blank) Date Medicare IM given:   Date Additional Medicare IM given:    Discharge Disposition:  [place]  Per UR Regulation:  Reviewed for med. necessity/level of care/duration of stay  If discussed at [place] of Stay Meetings, dates discussed:    Comments:  03/23/12 16:03 Tomi Bamberger RN, BSN [phone] patient lives with a friend, she has home oxygen, she is active with [facility] for [facility], [place],  notified [place] with [facility] that patient will be dc today and they will add a CSW.  Soc will begin 24-48 hrs post discharge.  Patient states she does not have $8.40 to get Robitussin DM , I spoke with [facility] and they were able to give patient $10 for her cough medicine.  Medicaid does not cover cough medicine.

## 2012-03-23 NOTE — H&P (Signed)
[facility] Admission Note Date: 03/23/2012  Patient name:  Kara Vincent  Medical record number:  [id number] Date of birth:  1965-10-22  Age: 46 y.o. Gender:  female PCP:    Harvie Junior, MD  Medical Service:  Internal Medicine  Chief Complaint: cough and SOB  History of Present Illness: Patient is a 46 y.o. female with a PMHx of asthma, morbid obesity recent hospitalization on 12/25-12/28 returns with worsening shortness of breath, trouble breathing, productive cough.  Patient was first time seen in the ER on 12/ 21 for right middle lobe pneumonia. She failed outpatient antibiotic therapy and had to be admitted to the hospital through 12/25 to 12/28 with worsening shortness of breath. She was placed on vancomycin and Zosyn for 4 days but was switched to levaquin at d/c to complete total of 10 days of antibiotics. She returns to the ER  again today  with worsening SOB , cough and fevers. She just filled her antibiotics today and took one dose of levaquin. She reports having cough with brown-colored sputum production. Patient took her temperatures at home and they were found 99.5 ever since discharge. No other complaints.   Current Outpatient Medications: Current Facility-Administered Medications  Medication Dose Route Frequency Provider Last Rate Last Dose  . piperacillin-tazobactam (ZOSYN) IVPB 3.375 g  3.375 g Intravenous Once Carlisle Cater, PA 12.5 mL/hr at 03/23/12 0053 3.375 g at 03/23/12 0053  . vancomycin (VANCOCIN) IVPB 1000 mg/200 mL premix  1,000 mg Intravenous Once Carlisle Cater, [place]       Current Outpatient Prescriptions  Medication Sig Dispense Refill  . albuterol (PROVENTIL HFA) 108 (90 BASE) MCG/ACT inhaler Inhale 2 puffs into the lungs every 4 (four) hours as needed. For asthma relief      . albuterol (PROVENTIL) (2.5 MG/3ML) 0.083% nebulizer solution Take 3 mLs (2.5 mg total) by nebulization every 6 (six) hours as needed for wheezing.   75 mL  12  . ALPRAZolam (XANAX) 1 MG tablet Take 1 mg by mouth at bedtime as needed.      [name] amitriptyline (ELAVIL) 75 MG tablet Take 75 mg by mouth at bedtime.      [name] ammonium lactate (AMLACTIN) 12 % cream Apply 1 g topically as needed. Applies to back and legs.Marland KitchenMarland KitchenIn a thin layer on affected area for dry patches.      [name] aspirin 325 MG tablet Take 325 mg by mouth daily.      [name] atorvastatin (LIPITOR) 20 MG tablet Take 20 mg by mouth daily.      . cloNIDine (CATAPRES) 0.2 MG tablet Take 0.2 mg by mouth 2 (two) times daily.      . diphenhydrAMINE (BENADRYL) 25 mg capsule Take 25 mg by mouth every 6 (six) hours as needed. For itching      . Emollient (EUCERIN) lotion Apply topically 2 (two) times daily as needed. On affected areas for dry patches.      . famotidine (PEPCID) 20 MG tablet Take 1 tablet (20 mg total) by mouth 2 (two) times daily.  30 tablet  0  . furosemide (LASIX) 40 MG tablet Take 1 tablet (40 mg total) by mouth daily.  30 tablet    . guaiFENesin-dextromethorphan (ROBITUSSIN DM) 100-10 MG/5ML syrup Take 5 mLs by mouth 3 (three) times daily as needed for cough.  240 mL  0  . HYDROcodone-acetaminophen (NORCO) 10-325 MG per tablet Take 1 tablet by mouth every 6 (six) hours as needed. For pain  20 tablet  0  . ketoconazole (NIZORAL) 2 % cream Apply topically daily.      [name] ketoconazole (NIZORAL) 2 % shampoo Apply 1 application topically 2 (two) times a week.      [name] ketoconazole (NIZORAL) 200 MG tablet Take 200 mg by mouth daily.      [name] lubiprostone (AMITIZA) 24 MCG capsule Take 24 mcg by mouth every evening.       . pantoprazole (PROTONIX) 40 MG tablet Take 40 mg by mouth daily.      . predniSONE (STERAPRED UNI-PAK) 10 MG tablet Take 1 tablet (10 mg total) by mouth daily. Take 6-5-4-3-2-1 tablets orally till gone.  21 tablet  0  . promethazine (PHENERGAN) 25 MG tablet Take 25 mg by mouth every 6 (six) hours as needed. For nausea/vomiting      . solifenacin (VESICARE) 5 MG tablet Take 5 mg by  mouth 2 (two) times daily.      . [DISCONTINUED] diphenhydrAMINE (SOMINEX) 25 MG tablet Take 25 mg by mouth at bedtime as needed.        Allergies: Ibuprofen; Adhesive; Azithromycin; Doxycycline; Sulfa antibiotics; and Sulfamethoxazole w-trimethoprim  Past Medical History: Past Medical History  Diagnosis Date  . Asthma   . GERD (gastroesophageal reflux disease)   . Hypertension   . Homelessness   . Low back pain   . 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 Arthritis   . Gout   . Hyperlipemia   . Morbid obesity     uses motor wheel chair  . MRSA (methicillin resistant Staphylococcus aureus)     states about a year ago    Past Surgical History: Past Surgical History  Procedure Date  . Tubal ligation   . Cesarean section     Family History: Family History  Problem Relation Age of Onset  . Asthma Mother   . Diabetes Father   . Heart disease Father   . Stroke Father     Social History: History   Social History  . Marital Status: Single    Spouse Name: N/A    Number of Children: N/A  . Years of Education: N/A   Occupational History  . Not on file.   Social History Main Topics  . Smoking status: Former Smoker    Types: Cigarettes    Quit date: 03/25/2003  . Smokeless tobacco: Former Systems developer    Quit date: 05/27/1978  . Alcohol Use: No  . Drug Use: No  . Sexually Active: Not on file   Other Topics Concern  . Not on file   Social History Narrative   Patient has exhausted [facility] financial resources. She does have access to [employer] and [employer]. She should be redirected to purse disability with her attorney and reapply of Medicaid    Review of Systems: Pertinent items are noted in HPI.  Vital Signs: T: 99.2 P: 97 BP: 147/102 RR: 14 O2 sat: 96 % on RA   Physical Exam: General: Vital signs reviewed and noted. Morbidly obese,  in no acute distress; alert, appropriate and cooperative throughout examination.  Head: Normocephalic,  atraumatic.  Eyes: PERRL, EOMI, No signs of anemia or jaundince.  Ears: TM nonerythematous, not bulging, good light reflex bilaterally.  Nose: Mucous membranes moist, not inflammed, nonerythematous.  Throat: Oropharynx nonerythematous, no exudate appreciated.   Neck: No deformities, masses, or tenderness noted.Supple, No carotid Bruits, no JVD.  Lungs:  Normal respiratory effort. Scattered wheezes throughout the  lung fields anteriorly  Heart: RRR. S1 and S2 normal without gallop, murmur, or rubs.  Abdomen:  BS normoactive. Soft, Nondistended, non-tender.  No masses or organomegaly.  Extremities:  1+ pitting edema.  Neurologic: A&O X3, CN II - XII are grossly intact. Motor strength is 5/5 in the all 4 extremities, Sensations intact to light touch, Cerebellar signs negative.  Skin: No visible rashes, scars.   Lab results: CBC:    Component Value Date/Time   WBC 11.7* 03/23/2012 0019   HGB 11.6* 03/23/2012 0025   HCT 34.0* 03/23/2012 0025   PLT 271 03/23/2012 0019   MCV 96.6 03/23/2012 0019   NEUTROABS 8.9* 03/23/2012 0019   LYMPHSABS 1.9 03/23/2012 0019   MONOABS 0.6 03/23/2012 0019   EOSABS 0.3 03/23/2012 0019   BASOSABS 0.0 03/23/2012 0019      Comprehensive Metabolic Panel:    Component Value Date/Time   NA 143 03/23/2012 0025   K 3.9 03/23/2012 0025   CL 97 03/23/2012 0025   CO2 37* 03/22/2012 2359   BUN 27* 03/23/2012 0025   CREATININE 1.30* 03/23/2012 0025   GLUCOSE 110* 03/23/2012 0025   CALCIUM 8.4 03/22/2012 2359   AST 12 03/22/2012 2359   ALT 13 03/22/2012 2359   ALKPHOS 51 03/22/2012 2359   BILITOT 0.3 03/22/2012 2359   PROT 6.2 03/22/2012 2359   ALBUMIN 3.1* 03/22/2012 2359     Lab Results  Component Value Date   CKTOTAL 65 08/25/2011   CKMB 1.6 08/25/2011   TROPONINI <0.30 03/17/2012      Imaging results: CXR 03/22/12: Findings: Nondiagnostic lateral view. Motion degradation and patient body habitus. The frontal view is moderately degraded as well.  Cardiomegaly accentuated by AP portable technique. No right and no definite left pleural effusion. No pneumothorax. Mild interstitial edema, slightly improved. Minimally improved right upper and midlung airspace opacities. Probable left base volume loss/atelectasis.  IMPRESSION:  1. Moderately degraded exam.  2. Mild congestive heart failure, minimally improved.  3. Slight improvement in right-sided airspace opacities, likely  infection.    Assessment & Plan:  1. Health care Pneumonia:  Recently d/c from the hospital after getting treated for HCAP but did not refill her meds until today and now returns with worsening SOB, cough and fever. She had low grade temp. Her CXR shows some improvement in RLL infiltrate. WBC continues to be high but stable.  -Admit to med- surg bed -Obtain sputum cultures, blood cultures,  check urine for legionella and streptococcal antigen -Treat with vanc and zosyn for healthcare associated pneumonia.   2. Acute asthma exacerbation: Patient had a moderate-to-severe wheezing, probably exacerbated by her pneumonia. Patient was discharged home on prednisone taper.  -Start solumedrol 125 q12 and taper after 2 days. -Duonebs  3. Hypertension: She has reasonable control. Continue home meds.    4. Morbid obesity: Patient weighs 407 pounds, she has BMI of 68. She has recent exacerbation of her morbid obesity she said she used to weigh about 340 pounds. Patient counseled extensively about diet options.  5. CKD stage II/III: Her baseline Cr~1.2. She presented with Cr of 1.29. Continue to monitor.   DVT PPX: lovenox  Code: Full  Family communication: Patient was updated on the plan of care. Dispo: likely in 3-4 days    Janell Quiet, M.D.  03/23/2012, 1:09 AM

## 2012-03-23 NOTE — Progress Notes (Signed)
Clinical Social Work Department BRIEF PSYCHOSOCIAL ASSESSMENT 03/23/2012  Patient:  Kara Vincent, Kara Vincent     Account Number:  [id number]     Admit date:  03/22/2012  Clinical Social Worker:  Earlie Server  Date/Time:  03/23/2012 10:00 AM  Referred by:  Physician  Date Referred:  03/23/2012 Referred for  ALF Placement   Other Referral:   Interview type:  Patient Other interview type:    PSYCHOSOCIAL DATA Living Status:  ALONE Admitted from facility:   Level of care:   Primary support name:  Elberta Fortis Primary support relationship to patient:  FRIEND Degree of support available:   Adequate    CURRENT CONCERNS Current Concerns  Post-Acute Placement   Other Concerns:    SOCIAL WORK ASSESSMENT / PLAN Patient was discussed during progression meeting. Patient recently dc and readmitted shortly after dc. MD concerned about patient's ability to live alone and CSW consulted to complete psychosocial assessment.    CSW introduced myself and explained role. Patient was dc from the hospital on Sunday. Patient uses electric wheelchair to ambulate. She reports she usually rides the bus but bus was not running so she used wheelchair to get to friends house that lives near the hospital. Patient reports she was not feeling well and short of breath so returned to the hospital. Patient reports she got all of her medications and was taking them as prescribed. Patient reports that she has a friend that lives with her but she is about to kick him out. Patient reports that she feels she can safely care for herself at home.    CSW spoke with patient regarding ALF placement. Patient reports she is not interested in ALF and "would not touch it with a 10 foot pole." Patient reports that she will have friends assist her and will use HH to ensure she is safe at home.    Patient uses [facility] as well and is worried about transportation home. When patient is dc, CSW can assist with transportation home.    Assessment/plan status:  Psychosocial Support/Ongoing Assessment of Needs Other assessment/ plan:   Information/referral to community resources:   ALF information    PATIENT'S/FAMILY'S RESPONSE TO PLAN OF CARE: Patient alert and oriented. Patient engaged throughout assessment but refusing ALF placement. Patient agreeable to CSW assistance with transportation if needed.

## 2012-03-23 NOTE — Progress Notes (Signed)
Pt requesting additional pain medicine. On call MD notified, new orders given.

## 2012-03-23 NOTE — Progress Notes (Signed)
Brief Nutrition Note:  RD notified by [facility] that pt is refusing to order meals if not placed on a regular diet. RD spoke with pt who states that she will not eat anything from the hospital unless she is allowed to order from a regular diet, she will have someone bring her in food.  Our regular diet is likely going to be lower in fat, sodium, and kcal then a meal brought in. If pt is not going to be d/c'd today, recommend change diet back to regular.   Orson Slick RD, LDN Pager [phone] After Hours pager [phone]

## 2012-03-23 NOTE — Progress Notes (Signed)
Clinical Social Work  MD reports patient will dc today. CSW met with patient to speak about transportation. Patient reports that she has a bus pass and does not need assistance. CSW is signing off but available if needed.  [place], [place] [phone]

## 2012-03-23 NOTE — Progress Notes (Signed)
Clinical Social Work  CSW received inappropriate referral for medication assistance. CSW informed CM of needs. CSW is signing off but available if further needs arise.  [place], [place] [phone]

## 2012-03-23 NOTE — Discharge Summary (Signed)
Patient seen and examined by me.  Patient was d/c'd with abx that she did not take.  Is on home O2 already- said she felt SOB and rode her power scooter > 1 mile to come back to ER.  Spoke with patient about being complaint with medications and that to feel 100% from her PNA could take at least 6 weeks.  Eulogio Bear DO

## 2012-03-23 NOTE — Discharge Summary (Signed)
Physician Discharge Summary  Shelonda Bollenbacher Guerrera [id number] DOB: 12-11-1965 DOA: 03/22/2012  PCP: Harvie Junior, MD  Admit date: 03/22/2012 Discharge date: 03/23/2012  Time spent: 45  minutes  Recommendations for Outpatient Follow-up:  Follow with primary care physician within one week  DO NOT RE-ADMIT.  PATIENT HAS BEEN NON-COMPLIANT WITH HER ANTIBIOTICS.   Discharge Diagnoses:  Active Problems:  OBSTRUCTIVE SLEEP APNEA  HYPERTENSION  ASTHMA  Dehydration  Morbid obesity  HCAP (healthcare-associated pneumonia)   Discharge Condition: stable,  On oxygen at home.  Still with some wheeze and cough.   Diet recommendation: heart healthy  Filed Weights   03/23/12 0145 03/23/12 0226  Weight: 185 kg (407 lb 13.6 oz) 197.8 kg (436 lb 1.1 oz)    History of present illness:  Patient was hospitalized at [facility] from 12/25 thu 12/28 for pneumonia.  Please see discharge summary of 03/20/12.  She was discharged on antibiotic therapy which she did not fill until 12/30.  On 12/30 she presented to the ED with SOB and was admitted.   Hospital Course:    Health care Pneumonia: Recently d/c from the hospital after getting treated for HCAP but did not refill her meds until today and  returns with worsening SOB, cough and fever. She had low grade temp. Her CXR shows some improvement in RLL infiltrate. WBC continues to be high but stable.  She was treated with IV vancomycin and zosyn and monitored.  She was found to be stable for discharge.  She was counseled to take her medications upon discharge.  She will be discharged to home today with home health RN and Social worker (in case she has additional needs).    2. Acute asthma exacerbation: Patient had a moderate-to-severe wheezing, probably exacerbated by her pneumonia. Patient was discharged home on prednisone taper.  She was treated with IV solumedrol and will be discharged to home on her previous prednisone taper.  She will receive refills for  her nebulizer medications.  3. Hypertension: She has reasonable control. Continue home meds.   4. Morbid obesity: Patient weighs 407 pounds, she has BMI of 68. She has recent exacerbation of her morbid obesity she said she used to weigh about 340 pounds. Patient counseled extensively about diet options.   5. CKD stage II/III: Her baseline Cr~1.2. She presented with Cr of 1.29 with improved to 1.18 with IVF.   Discharge Exam: Filed Vitals:   03/23/12 0626 03/23/12 0922 03/23/12 0949 03/23/12 0951  BP: 137/81     Pulse: 93  107 98  Temp: 98.2 F (36.8 C)     TempSrc: Oral     Resp: 20     Height:      Weight:      SpO2: 94% 95% 88% 95%    General: A&O, NAD, Lying in bed. Cardiovascular: RRR no m/r/g Respiratory: Forced wheeze and cough, no rales or rhonchi.  No accessory muscle use. Abdomen:  Massive, active bs, nt, nd  Discharge Instructions      Discharge Orders    Future Orders Please Complete By Expires   Diet - low sodium heart healthy      Increase activity slowly          Medication List     As of 03/23/2012 12:29 PM    TAKE these medications         PROVENTIL HFA 108 (90 BASE) MCG/ACT inhaler   Generic drug: albuterol   Inhale 2 puffs into the lungs every 4 (four)  hours as needed. For asthma relief      albuterol (2.5 MG/3ML) 0.083% nebulizer solution   Commonly known as: PROVENTIL   Take 3 mLs (2.5 mg total) by nebulization every 6 (six) hours as needed for wheezing.      albuterol (5 MG/ML) 0.5% nebulizer solution   Commonly known as: PROVENTIL   Take 0.5 mLs (2.5 mg total) by nebulization every 4 (four) hours.      ALPRAZolam 1 MG tablet   Commonly known as: XANAX   Take 1 tablet (1 mg total) by mouth at bedtime as needed.      amitriptyline 75 MG tablet   Commonly known as: ELAVIL   Take 1 tablet (75 mg total) by mouth at bedtime.      ammonium lactate 12 % cream   Commonly known as: AMLACTIN   Apply 1 g topically as needed. Applies to back  and legs.Marland KitchenMarland KitchenIn a thin layer on affected area for dry patches.      aspirin 325 MG tablet   Take 325 mg by mouth daily.      atorvastatin 20 MG tablet   Commonly known as: LIPITOR   Take 20 mg by mouth daily.      cloNIDine 0.2 MG tablet   Commonly known as: CATAPRES   Take 0.2 mg by mouth 2 (two) times daily.      diphenhydrAMINE 25 mg capsule   Commonly known as: BENADRYL   Take 25 mg by mouth every 6 (six) hours as needed. For itching      eucerin lotion   Apply topically 2 (two) times daily as needed. On affected areas for dry patches.      famotidine 20 MG tablet   Commonly known as: PEPCID   Take 1 tablet (20 mg total) by mouth 2 (two) times daily.      furosemide 40 MG tablet   Commonly known as: LASIX   Take 1 tablet (40 mg total) by mouth daily.      guaiFENesin-dextromethorphan 100-10 MG/5ML syrup   Commonly known as: ROBITUSSIN DM   Take 5 mLs by mouth 3 (three) times daily as needed for cough.      HYDROcodone-acetaminophen 10-325 MG per tablet   Commonly known as: NORCO   Take 1 tablet by mouth every 6 (six) hours as needed. For pain      ipratropium 0.02 % nebulizer solution   Commonly known as: ATROVENT   Take 2.5 mLs (0.5 mg total) by nebulization every 4 (four) hours.      ketoconazole 2 % cream   Commonly known as: NIZORAL   Apply topically daily.      ketoconazole 2 % shampoo   Commonly known as: NIZORAL   Apply 1 application topically 2 (two) times a week.      ketoconazole 200 MG tablet   Commonly known as: NIZORAL   Take 200 mg by mouth daily.      levofloxacin 750 MG tablet   Commonly known as: LEVAQUIN   Take 1 tablet (750 mg total) by mouth daily.      lubiprostone 24 MCG capsule   Commonly known as: AMITIZA   Take 24 mcg by mouth every evening.      pantoprazole 40 MG tablet   Commonly known as: PROTONIX   Take 40 mg by mouth daily.      predniSONE 10 MG tablet   Commonly known as: STERAPRED UNI-PAK   Take 1 tablet (10 mg  total)  by mouth daily. Take 6-5-4-3-2-1 tablets orally till gone.      promethazine 25 MG tablet   Commonly known as: PHENERGAN   Take 25 mg by mouth every 6 (six) hours as needed. For nausea/vomiting      solifenacin 5 MG tablet   Commonly known as: VESICARE   Take 5 mg by mouth 2 (two) times daily.        Follow-up Information    Follow up with Harvie Junior, MD. Schedule an appointment as soon as possible for a visit in 1 week.   Contact information:   [address] [place] [address] [phone]           The results of significant diagnostics from this hospitalization (including imaging, microbiology, ancillary and laboratory) are listed below for reference.    Significant Diagnostic Studies: Dg Chest 2 View  03/22/2012  *RADIOLOGY REPORT*  Clinical Data: Shortness of breath.  Morbid obesity.  CHEST - 2 VIEW  Comparison: 03/16/2012  Findings: Nondiagnostic lateral view.  Motion degradation and patient body habitus.  The frontal view is moderately degraded as well. Cardiomegaly accentuated by AP portable technique.  No right and no definite left pleural effusion. No pneumothorax.  Mild interstitial edema, slightly improved.  Minimally improved right upper and midlung airspace opacities.  Probable left base volume loss/atelectasis.  IMPRESSION:  1.  Moderately degraded exam. 2.  Mild congestive heart failure, minimally improved. 3.  Slight improvement in right-sided airspace opacities, likely infection.   Original Report Authenticated By: Abigail Miyamoto, M.D.    Dg Chest 2 View  03/16/2012  *RADIOLOGY REPORT*  Clinical Data: Shortness of breath and cough.  CHEST - 2 VIEW  Comparison: Chest radiograph and CT of the chest performed 03/14/2012  Findings: The lungs are relatively well-aerated.  Mildly worsened right upper and middle lobe airspace opacities are again noted, concerning for pneumonia.  Underlying vascular congestion is seen. No definite pleural effusion or  pneumothorax is identified.  The heart is borderline normal in size; the mediastinal contour is within normal limits.  No acute osseous abnormalities are seen.  IMPRESSION: Mildly worsened right upper and middle lobe airspace opacities, concerning for mildly worsened pneumonia.  Underlying vascular congestion noted.   Original Report Authenticated By: Santa Lighter, M.D.    Dg Chest 2 View  03/14/2012  *RADIOLOGY REPORT*  Clinical Data: Cough and congestion.  CHEST - 2 VIEW  Comparison: 03/12/2012  Findings: Two views of the chest again demonstrate low lung volumes.  There are persistent densities in the right hilum and right lung base.  Heart size remains enlarged.  Trachea is midline. Bony thorax is intact.  IMPRESSION: Minimal change since the previous examination.  Again noted are densities in the right hilum and right lower lung region. Findings could be related to areas of volume loss.  An infectious etiology cannot be excluded.   Original Report Authenticated By: Markus Daft, M.D.    Dg Chest 2 View  03/12/2012  *RADIOLOGY REPORT*  Clinical Data: Shortness of breath, cough  CHEST - 2 VIEW  Comparison: 09/07/2011  Findings: Mild patchy right upper lobe opacity, possibly reflecting pneumonia. No pleural effusion or pneumothorax.  Cardiomegaly.  Degenerative changes of the visualized thoracolumbar spine.  IMPRESSION: Mild patchy right upper lobe opacity, possibly reflecting pneumonia.   Original Report Authenticated By: Julian Hy, M.D.    Ct Chest W Contrast  03/14/2012  *RADIOLOGY REPORT*  Clinical Data: Shortness of breath  CT CHEST WITH CONTRAST  Technique:  Multidetector CT imaging of the chest was performed following the standard protocol during bolus administration of intravenous contrast.  Contrast: 140mL OMNIPAQUE IOHEXOL 300 MG/ML  SOLN  Comparison: 01/15/2011  Findings: Degraded by patient body habitus and respiratory motion.  Right upper and middle lobe airspace opacity and to a  lesser extent, lingula. No pneumothorax.  Normal caliber aorta.  No main branch pulmonary arterial filling defect.  This exam is not optimized to evaluate the pulmonary vasculature.  Normal heart size.  No pleural or pericardial effusion. Mildly prominent right paratracheal and right hilar lymph nodes.  Limited images through the upper abdomen show no acute finding.  No acute osseous finding, multilevel degenerative change.  IMPRESSION: Right upper and middle lobe airspace opacities and to a lesser extent lingular opacity, atelectasis versus pneumonia.  Recommend radiograph follow-up after therapy to document resolution.   Original Report Authenticated By: Carlos Levering, M.D.     Microbiology: Recent Results (from the past 240 hour(s))  CULTURE, BLOOD (ROUTINE X 2)     Status: Normal   Collection Time   03/17/12  1:35 AM      Component Value Range Status Comment   Specimen Description BLOOD RIGHT ARM   Final    Special Requests BOTTLES DRAWN AEROBIC AND ANAEROBIC 10CC EACH   Final    Culture  Setup Time 03/17/2012 09:48   Final    Culture NO GROWTH 5 DAYS   Final    Report Status 03/23/2012 FINAL   Final   CULTURE, BLOOD (ROUTINE X 2)     Status: Normal   Collection Time   03/17/12  1:40 AM      Component Value Range Status Comment   Specimen Description BLOOD RIGHT ARM   Final    Special Requests BOTTLES DRAWN AEROBIC AND ANAEROBIC 5CC EACH   Final    Culture  Setup Time 03/17/2012 09:48   Final    Culture NO GROWTH 5 DAYS   Final    Report Status 03/23/2012 FINAL   Final   MRSA PCR SCREENING     Status: Abnormal   Collection Time   03/17/12  5:46 AM      Component Value Range Status Comment   MRSA by PCR POSITIVE (*) NEGATIVE Final      Labs: Basic Metabolic Panel:  Lab [id number] 03/23/12 0025 03/22/12 2359 03/20/12 0610 03/19/12 0912 03/18/12 0622  NA 141 143 141 140 142 --  K 3.9 3.9 3.9 4.2 4.6 --  CL 97 97 98 100 104 --  CO2 32 -- 37* 33* 30 31  GLUCOSE 180* 110*  134* 128* 151* --  BUN 24* 27* 26* 25* 22 --  CREATININE 1.18* 1.30* 1.26* 1.29* 1.09 --  CALCIUM 8.2* -- 8.4 7.9* 8.3* 8.1*  MG -- -- -- -- -- --  PHOS -- -- -- -- -- --   Liver Function Tests:  Lab 03/22/12 2359 03/17/12 0126  AST 12 13  ALT 13 11  ALKPHOS 51 62  BILITOT 0.3 0.3  PROT 6.2 6.9  ALBUMIN 3.1* 3.4*   CBC:  Lab 03/23/12 [id number] 03/23/12 0025 03/23/12 0019 03/19/12 0912 03/18/12 0622 03/17/12 0837 03/17/12 0126  WBC 13.0* -- 11.7* 11.8* 12.6* 8.2 --  NEUTROABS -- -- 8.9* -- -- -- 5.8  HGB 11.3* 11.6* 11.4* 11.4* 11.0* -- --  HCT 37.0 34.0* 37.2 37.8 36.4 -- --  MCV 97.9 -- 96.6 99.7 97.8 96.1 --  PLT 239 -- 271 273 276 278 --  Cardiac Enzymes:  Lab 03/17/12 0135  CKTOTAL --  CKMB --  CKMBINDEX --  TROPONINI <0.30   BNP: BNP (last 3 results)  Basename 03/23/12 0007 03/17/12 0135 09/03/11 2200  PROBNP 133.5* 514.3* 67.8    Signed:  Karen Kitchens  [facility] [phone] 03/23/2012, 12:29 PM

## 2012-03-23 NOTE — Progress Notes (Signed)
NURSING PROGRESS NOTE  Kara Vincent [id number] Discharge Data: 03/23/2012 4:27 PM Attending Provider: Geradine Girt, DO HF:[id number] Kara Mages, MD     Kara Vincent to be D/C'd Home per MD order.  Discussed with the patient the After Visit Summary and all questions fully answered. All IV's discontinued with no bleeding noted. All belongings returned to patient for patient to take home.   Last Vital Signs:  Blood pressure 137/81, pulse 98, temperature 98.2 F (36.8 C), temperature source Oral, resp. rate 20, height 5\' 5"  (1.651 m), weight 197.8 kg (436 lb 1.1 oz), last menstrual period 03/24/2009, SpO2 96.00%.  Discharge Medication List   Medication List     As of 03/23/2012  4:27 PM    TAKE these medications         PROVENTIL HFA 108 (90 BASE) MCG/ACT inhaler   Generic drug: albuterol   Inhale 2 puffs into the lungs every 4 (four) hours as needed. For asthma relief      albuterol (2.5 MG/3ML) 0.083% nebulizer solution   Commonly known as: PROVENTIL   Take 3 mLs (2.5 mg total) by nebulization every 6 (six) hours as needed for wheezing.      albuterol (5 MG/ML) 0.5% nebulizer solution   Commonly known as: PROVENTIL   Take 0.5 mLs (2.5 mg total) by nebulization every 4 (four) hours.      ALPRAZolam 1 MG tablet   Commonly known as: XANAX   Take 1 tablet (1 mg total) by mouth at bedtime as needed.      amitriptyline 75 MG tablet   Commonly known as: ELAVIL   Take 1 tablet (75 mg total) by mouth at bedtime.      ammonium lactate 12 % cream   Commonly known as: AMLACTIN   Apply 1 g topically as needed. Applies to back and legs.Marland KitchenMarland KitchenIn a thin layer on affected area for dry patches.      aspirin 325 MG tablet   Take 325 mg by mouth daily.      atorvastatin 20 MG tablet   Commonly known as: LIPITOR   Take 20 mg by mouth daily.      cloNIDine 0.2 MG tablet   Commonly known as: CATAPRES   Take 0.2 mg by mouth 2 (two) times daily.      diphenhydrAMINE 25 mg capsule   Commonly known as: BENADRYL   Take 25 mg by mouth every 6 (six) hours as needed. For itching      eucerin lotion   Apply topically 2 (two) times daily as needed. On affected areas for dry patches.      famotidine 20 MG tablet   Commonly known as: PEPCID   Take 1 tablet (20 mg total) by mouth 2 (two) times daily.      furosemide 40 MG tablet   Commonly known as: LASIX   Take 1 tablet (40 mg total) by mouth daily.      guaiFENesin-dextromethorphan 100-10 MG/5ML syrup   Commonly known as: ROBITUSSIN DM   Take 5 mLs by mouth 3 (three) times daily as needed for cough.      HYDROcodone-acetaminophen 10-325 MG per tablet   Commonly known as: NORCO   Take 1 tablet by mouth every 6 (six) hours as needed. For pain      ipratropium 0.02 % nebulizer solution   Commonly known as: ATROVENT   Take 2.5 mLs (0.5 mg total) by nebulization every 4 (four) hours.      ketoconazole  2 % cream   Commonly known as: NIZORAL   Apply topically daily.      ketoconazole 2 % shampoo   Commonly known as: NIZORAL   Apply 1 application topically 2 (two) times a week.      ketoconazole 200 MG tablet   Commonly known as: NIZORAL   Take 200 mg by mouth daily.      levofloxacin 750 MG tablet   Commonly known as: LEVAQUIN   Take 1 tablet (750 mg total) by mouth daily.      lubiprostone 24 MCG capsule   Commonly known as: AMITIZA   Take 24 mcg by mouth every evening.      pantoprazole 40 MG tablet   Commonly known as: PROTONIX   Take 40 mg by mouth daily.      predniSONE 10 MG tablet   Commonly known as: STERAPRED UNI-PAK   Take 1 tablet (10 mg total) by mouth daily. Take 6-5-4-3-2-1 tablets orally till gone.      promethazine 25 MG tablet   Commonly known as: PHENERGAN   Take 25 mg by mouth every 6 (six) hours as needed. For nausea/vomiting      solifenacin 5 MG tablet   Commonly known as: VESICARE   Take 5 mg by mouth 2 (two) times daily.        Deatra Ina, RN

## 2012-03-23 NOTE — Progress Notes (Signed)
Pt admitted to the unit. Pt is alert and oriented. Pt oriented to room, staff, and call bell. Bed in lowest position. Full assessment to Epic. Call bell with in reach. Told to call for assists. Will continue to monitor.  Grettel Rames E  

## 2012-03-23 NOTE — ED Provider Notes (Signed)
History     CSN: [id number]  Arrival date & time 03/22/12  2003   First MD Initiated Contact with Patient 03/22/12 2337      Chief Complaint  Patient presents with  . Cough    (Consider location/radiation/quality/duration/timing/severity/associated sxs/prior treatment) HPI Comments: Patient with history of asthma, morbid obesity, recent hospitalization on 12/25-12/28 returns with worsening shortness of breath, trouble breathing, productive cough. Patient states that she just filled her antibiotics today and took one dose of Levaquin. She states she has had fever to 99.13F. No nausea, vomiting, or diarrhea. No chest pain or abdominal pain. She states that she feels like her lower extremities are more swollen than usual. No change in urination. Patient states she's been using her home nebulizer and inhaler treatments without improvement. Per nurse, patient had O2 sat of 89% on room air. Onset acute. Course is worsening. Nothing makes symptoms better or worse.  Patient is a 46 y.o. female presenting with cough. The history is provided by the patient and medical records.  Cough Associated symptoms include chills, shortness of breath and wheezing. Pertinent negatives include no chest pain, no headaches, no rhinorrhea, no sore throat, no myalgias and no eye redness.    Past Medical History  Diagnosis Date  . Asthma   . GERD (gastroesophageal reflux disease)   . Hypertension   . Homelessness   . Low back pain   . 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 Arthritis   . Gout   . Hyperlipemia   . Morbid obesity     uses motor wheel chair  . MRSA (methicillin resistant Staphylococcus aureus)     states about a year ago    Past Surgical History  Procedure Date  . Tubal ligation   . Cesarean section     Family History  Problem Relation Age of Onset  . Asthma Mother   . Diabetes Father   . Heart disease Father   . Stroke Father     History  Substance Use Topics  .  Smoking status: Former Smoker    Types: Cigarettes    Quit date: 03/25/2003  . Smokeless tobacco: Former Systems developer    Quit date: 05/27/1978  . Alcohol Use: No    OB History    Grav Para Term Preterm Abortions TAB SAB Ect Mult Living                  Review of Systems  Constitutional: Positive for fever and chills.  HENT: Negative for sore throat and rhinorrhea.   Eyes: Negative for redness.  Respiratory: Positive for cough, shortness of breath and wheezing.   Cardiovascular: Positive for leg swelling. Negative for chest pain.  Gastrointestinal: Negative for nausea, vomiting, abdominal pain and diarrhea.  Genitourinary: Negative for dysuria.  Musculoskeletal: Negative for myalgias.  Skin: Negative for rash.  Neurological: Negative for headaches.    Allergies  Ibuprofen; Adhesive; Azithromycin; Doxycycline; Sulfa antibiotics; and Sulfamethoxazole w-trimethoprim  Home Medications   Current Outpatient Rx  Name  Route  Sig  Dispense  Refill  . ALBUTEROL SULFATE HFA 108 (90 BASE) MCG/ACT IN AERS   Inhalation   Inhale 2 puffs into the lungs every 4 (four) hours as needed. For asthma relief         . ALBUTEROL SULFATE (2.5 MG/3ML) 0.083% IN NEBU   Nebulization   Take 3 mLs (2.5 mg total) by nebulization every 6 (six) hours as needed for wheezing.   75  mL   12   . ALPRAZOLAM 1 MG PO TABS   Oral   Take 1 mg by mouth at bedtime as needed.         [name] AMITRIPTYLINE HCL 75 MG PO TABS   Oral   Take 75 mg by mouth at bedtime.         . AMMONIUM LACTATE 12 % EX CREA   Topical   Apply 1 g topically as needed. Applies to back and legs.Marland KitchenMarland KitchenIn a thin layer on affected area for dry patches.         . ASPIRIN 325 MG PO TABS   Oral   Take 325 mg by mouth daily.         . ATORVASTATIN CALCIUM 20 MG PO TABS   Oral   Take 20 mg by mouth daily.         [name] CLONIDINE HCL 0.2 MG PO TABS   Oral   Take 0.2 mg by mouth 2 (two) times daily.         [name] DIPHENHYDRAMINE HCL 25 MG PO  CAPS   Oral   Take 25 mg by mouth every 6 (six) hours as needed. For itching         . EUCERIN EX LOTN   Topical   Apply topically 2 (two) times daily as needed. On affected areas for dry patches.         [name] FAMOTIDINE 20 MG PO TABS   Oral   Take 1 tablet (20 mg total) by mouth 2 (two) times daily.   30 tablet   0   . FUROSEMIDE 40 MG PO TABS   Oral   Take 1 tablet (40 mg total) by mouth daily.   30 tablet      . GUAIFENESIN-DM 100-10 MG/5ML PO SYRP   Oral   Take 5 mLs by mouth 3 (three) times daily as needed for cough.   240 mL   0   . HYDROCODONE-ACETAMINOPHEN 10-325 MG PO TABS   Oral   Take 1 tablet by mouth every 6 (six) hours as needed. For pain   20 tablet   0   . KETOCONAZOLE 2 % EX CREA   Topical   Apply topically daily.         [name] KETOCONAZOLE 2 % EX SHAM   Topical   Apply 1 application topically 2 (two) times a week.         [name] KETOCONAZOLE 200 MG PO TABS   Oral   Take 200 mg by mouth daily.         . LUBIPROSTONE 24 MCG PO CAPS   Oral   Take 24 mcg by mouth every evening.          [name] PANTOPRAZOLE SODIUM 40 MG PO TBEC   Oral   Take 40 mg by mouth daily.         [name] PREDNISONE (PAK) 10 MG PO TABS   Oral   Take 1 tablet (10 mg total) by mouth daily. Take 6-5-4-3-2-1 tablets orally till gone.   21 tablet   0   . PROMETHAZINE HCL 25 MG PO TABS   Oral   Take 25 mg by mouth every 6 (six) hours as needed. For nausea/vomiting         . SOLIFENACIN SUCCINATE 5 MG PO TABS   Oral   Take 5 mg by mouth 2 (two) times daily.  BP 147/102  Pulse 97  Temp 99.2 F (37.3 C) (Oral)  Resp 14  SpO2 96%  LMP 03/24/2009  Physical Exam  Nursing note and vitals reviewed. Constitutional: She appears well-developed and well-nourished.       Morbid obesity  HENT:  Head: Normocephalic and atraumatic.  Eyes: Conjunctivae normal are normal. Right eye exhibits no discharge. Left eye exhibits no discharge.  Neck: Normal range of motion.  Neck supple.  Cardiovascular: Normal rate, regular rhythm and normal heart sounds.        Two to three word dyspnea  Pulmonary/Chest: Tachypnea (mid-20's/min) noted. No respiratory distress. She has no decreased breath sounds. She has wheezes (moderate all fields). She has no rales.  Abdominal: Soft. There is no tenderness. There is no rebound and no guarding.       Exam limited by habitus  Musculoskeletal: She exhibits edema (bilateral, mild).  Neurological: She is alert.  Skin: Skin is warm and dry.  Psychiatric: She has a normal mood and affect.    ED Course  Procedures (including critical care time)  Labs Reviewed  COMPREHENSIVE METABOLIC PANEL - Abnormal; Notable for the following:    CO2 37 (*)     Glucose, Bld 134 (*)     BUN 26 (*)     Creatinine, Ser 1.26 (*)     Albumin 3.1 (*)     GFR calc non Af Amer 50 (*)     GFR calc Af Amer 58 (*)     All other components within normal limits  PRO B NATRIURETIC PEPTIDE - Abnormal; Notable for the following:    Pro B Natriuretic peptide (BNP) 133.5 (*)     All other components within normal limits  CBC WITH DIFFERENTIAL - Abnormal; Notable for the following:    WBC 11.7 (*)     RBC 3.85 (*)     Hemoglobin 11.4 (*)     Neutro Abs 8.9 (*)     All other components within normal limits  POCT I-STAT, CHEM 8 - Abnormal; Notable for the following:    BUN 27 (*)     Creatinine, Ser 1.30 (*)     Glucose, Bld 110 (*)     Calcium, Ion 1.02 (*)     Hemoglobin 11.6 (*)     HCT 34.0 (*)     All other components within normal limits  CG4 I-STAT (LACTIC ACID)   Dg Chest 2 View  03/22/2012  *RADIOLOGY REPORT*  Clinical Data: Shortness of breath.  Morbid obesity.  CHEST - 2 VIEW  Comparison: 03/16/2012  Findings: Nondiagnostic lateral view.  Motion degradation and patient body habitus.  The frontal view is moderately degraded as well. Cardiomegaly accentuated by AP portable technique.  No right and no definite left pleural effusion. No  pneumothorax.  Mild interstitial edema, slightly improved.  Minimally improved right upper and midlung airspace opacities.  Probable left base volume loss/atelectasis.  IMPRESSION:  1.  Moderately degraded exam. 2.  Mild congestive heart failure, minimally improved. 3.  Slight improvement in right-sided airspace opacities, likely infection.   Original Report Authenticated By: Abigail Miyamoto, M.D.      1. HCAP (healthcare-associated pneumonia)     12:04 AM Patient seen and examined. D/w Dr. Marnette Burgess. Work-up initiated. Medications ordered.   Vital signs reviewed and are as follows: Filed Vitals:   03/22/12 2014  BP: 147/102  Pulse: 97  Temp: 99.2 F (37.3 C)  Resp: 14   1:12 AM Spoke with Dr.  Cathren Laine who will evaluate and admit patient.    MDM  HCAP, returns with hypoxia, wheezing, SOB. Admit for abx.        Carlisle Cater, [place] 03/23/12 [phone]

## 2012-03-23 NOTE — ED Provider Notes (Signed)
Medical screening examination/treatment/procedure(s) were performed by non-physician practitioner and as supervising physician I was immediately available for consultation/collaboration.  Teressa Lower, MD 03/23/12 [phone]

## 2012-03-23 NOTE — Progress Notes (Signed)
ANTIBIOTIC CONSULT NOTE - INITIAL  Pharmacy Consult for Vancomycin and Zosyn  Indication: HCAP  Allergies  Allergen Reactions  . Ibuprofen Hives, Shortness Of Breath and Palpitations  . Adhesive (Tape) Rash  . Azithromycin Hives and Rash  . Doxycycline Rash  . Sulfa Antibiotics Rash  . Sulfamethoxazole W-Trimethoprim Rash    Patient Measurements: Height: 5' 4.96" (165 cm) Weight: 407 lb 13.6 oz (185 kg) IBW/kg (Calculated) : 56.91  Adjusted Body Weight:   Vital Signs: Temp: 99.5 F (37.5 C) (12/31 0111) Temp src: Axillary (12/31 0111) BP: 168/77 mmHg (12/31 0145) Pulse Rate: 124  (12/31 0145) Intake/Output from previous day:   Intake/Output from this shift:    Labs:  Basename 03/23/12 0025 03/23/12 0019 03/22/12 2359 03/20/12 0610  WBC -- 11.7* -- --  HGB 11.6* 11.4* -- --  PLT -- 271 -- --  LABCREA -- -- -- --  CREATININE 1.30* -- 1.26* 1.29*   Estimated Creatinine Clearance: 91.3 ml/min (by C-G formula based on Cr of 1.3). No results found for this basename: VANCOTROUGH:2,VANCOPEAK:2,VANCORANDOM:2,GENTTROUGH:2,GENTPEAK:2,GENTRANDOM:2,TOBRATROUGH:2,TOBRAPEAK:2,TOBRARND:2,AMIKACINPEAK:2,AMIKACINTROU:2,AMIKACIN:2, in the last 72 hours   Microbiology: Recent Results (from the past 720 hour(s))  CULTURE, BLOOD (ROUTINE X 2)     Status: Normal (Preliminary result)   Collection Time   03/17/12  1:35 AM      Component Value Range Status Comment   Specimen Description BLOOD RIGHT ARM   Final    Special Requests BOTTLES DRAWN AEROBIC AND ANAEROBIC 10CC EACH   Final    Culture  Setup Time 03/17/2012 09:48   Final    Culture     Final    Value:        BLOOD CULTURE RECEIVED NO GROWTH TO DATE CULTURE WILL BE HELD FOR 5 DAYS BEFORE ISSUING A FINAL NEGATIVE REPORT   Report Status PENDING   Incomplete   CULTURE, BLOOD (ROUTINE X 2)     Status: Normal (Preliminary result)   Collection Time   03/17/12  1:40 AM      Component Value Range Status Comment   Specimen  Description BLOOD RIGHT ARM   Final    Special Requests BOTTLES DRAWN AEROBIC AND ANAEROBIC 5CC EACH   Final    Culture  Setup Time 03/17/2012 09:48   Final    Culture     Final    Value:        BLOOD CULTURE RECEIVED NO GROWTH TO DATE CULTURE WILL BE HELD FOR 5 DAYS BEFORE ISSUING A FINAL NEGATIVE REPORT   Report Status PENDING   Incomplete   MRSA PCR SCREENING     Status: Abnormal   Collection Time   03/17/12  5:46 AM      Component Value Range Status Comment   MRSA by PCR POSITIVE (*) NEGATIVE Final     Medical History: Past Medical History  Diagnosis Date  . Asthma   . GERD (gastroesophageal reflux disease)   . Hypertension   . Homelessness   . Low back pain   . 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 Arthritis   . Gout   . Hyperlipemia   . Morbid obesity     uses motor wheel chair  . MRSA (methicillin resistant Staphylococcus aureus)     states about a year ago    Medications:  Prescriptions prior to admission  Medication Sig Dispense Refill  . albuterol (PROVENTIL HFA) 108 (90 BASE) MCG/ACT inhaler Inhale 2 puffs into the lungs every 4 (four)  hours as needed. For asthma relief      . albuterol (PROVENTIL) (2.5 MG/3ML) 0.083% nebulizer solution Take 3 mLs (2.5 mg total) by nebulization every 6 (six) hours as needed for wheezing.  75 mL  12  . ALPRAZolam (XANAX) 1 MG tablet Take 1 mg by mouth at bedtime as needed.      [name] amitriptyline (ELAVIL) 75 MG tablet Take 75 mg by mouth at bedtime.      [name] ammonium lactate (AMLACTIN) 12 % cream Apply 1 g topically as needed. Applies to back and legs.Marland KitchenMarland KitchenIn a thin layer on affected area for dry patches.      [name] aspirin 325 MG tablet Take 325 mg by mouth daily.      [name] atorvastatin (LIPITOR) 20 MG tablet Take 20 mg by mouth daily.      . cloNIDine (CATAPRES) 0.2 MG tablet Take 0.2 mg by mouth 2 (two) times daily.      . diphenhydrAMINE (BENADRYL) 25 mg capsule Take 25 mg by mouth every 6 (six) hours as needed. For itching       . Emollient (EUCERIN) lotion Apply topically 2 (two) times daily as needed. On affected areas for dry patches.      . famotidine (PEPCID) 20 MG tablet Take 1 tablet (20 mg total) by mouth 2 (two) times daily.  30 tablet  0  . furosemide (LASIX) 40 MG tablet Take 1 tablet (40 mg total) by mouth daily.  30 tablet    . guaiFENesin-dextromethorphan (ROBITUSSIN DM) 100-10 MG/5ML syrup Take 5 mLs by mouth 3 (three) times daily as needed for cough.  240 mL  0  . HYDROcodone-acetaminophen (NORCO) 10-325 MG per tablet Take 1 tablet by mouth every 6 (six) hours as needed. For pain  20 tablet  0  . ketoconazole (NIZORAL) 2 % cream Apply topically daily.      [name] ketoconazole (NIZORAL) 2 % shampoo Apply 1 application topically 2 (two) times a week.      [name] ketoconazole (NIZORAL) 200 MG tablet Take 200 mg by mouth daily.      [name] lubiprostone (AMITIZA) 24 MCG capsule Take 24 mcg by mouth every evening.       . pantoprazole (PROTONIX) 40 MG tablet Take 40 mg by mouth daily.      . predniSONE (STERAPRED UNI-PAK) 10 MG tablet Take 1 tablet (10 mg total) by mouth daily. Take 6-5-4-3-2-1 tablets orally till gone.  21 tablet  0  . promethazine (PHENERGAN) 25 MG tablet Take 25 mg by mouth every 6 (six) hours as needed. For nausea/vomiting      . solifenacin (VESICARE) 5 MG tablet Take 5 mg by mouth 2 (two) times daily.       Assessment: 46 yo female with SOB for empiric antibiotics.  Vancomycin 1 g IV given in ED at 0130  Goal of Therapy:  Vancomycin trough level 15-20 mcg/ml  Plan:  Vancomycin 1500 mg IV now (for total of 2.5 g tonight), then 1500 mg IV q12h Zosyn 3.375 g IV q8h   Zaid Tomes, [place]onson Curb 03/23/2012,2:27 AM

## 2012-04-05 IMAGING — CR DG CHEST 2V
2 series · 2 of 2 positions shown · non-contrast
Comparison: 04/27/2009.

CLINICAL DATA: Shortness of breath.  Right shoulder pain.

CHEST - 2 VIEW

[w chest lat]
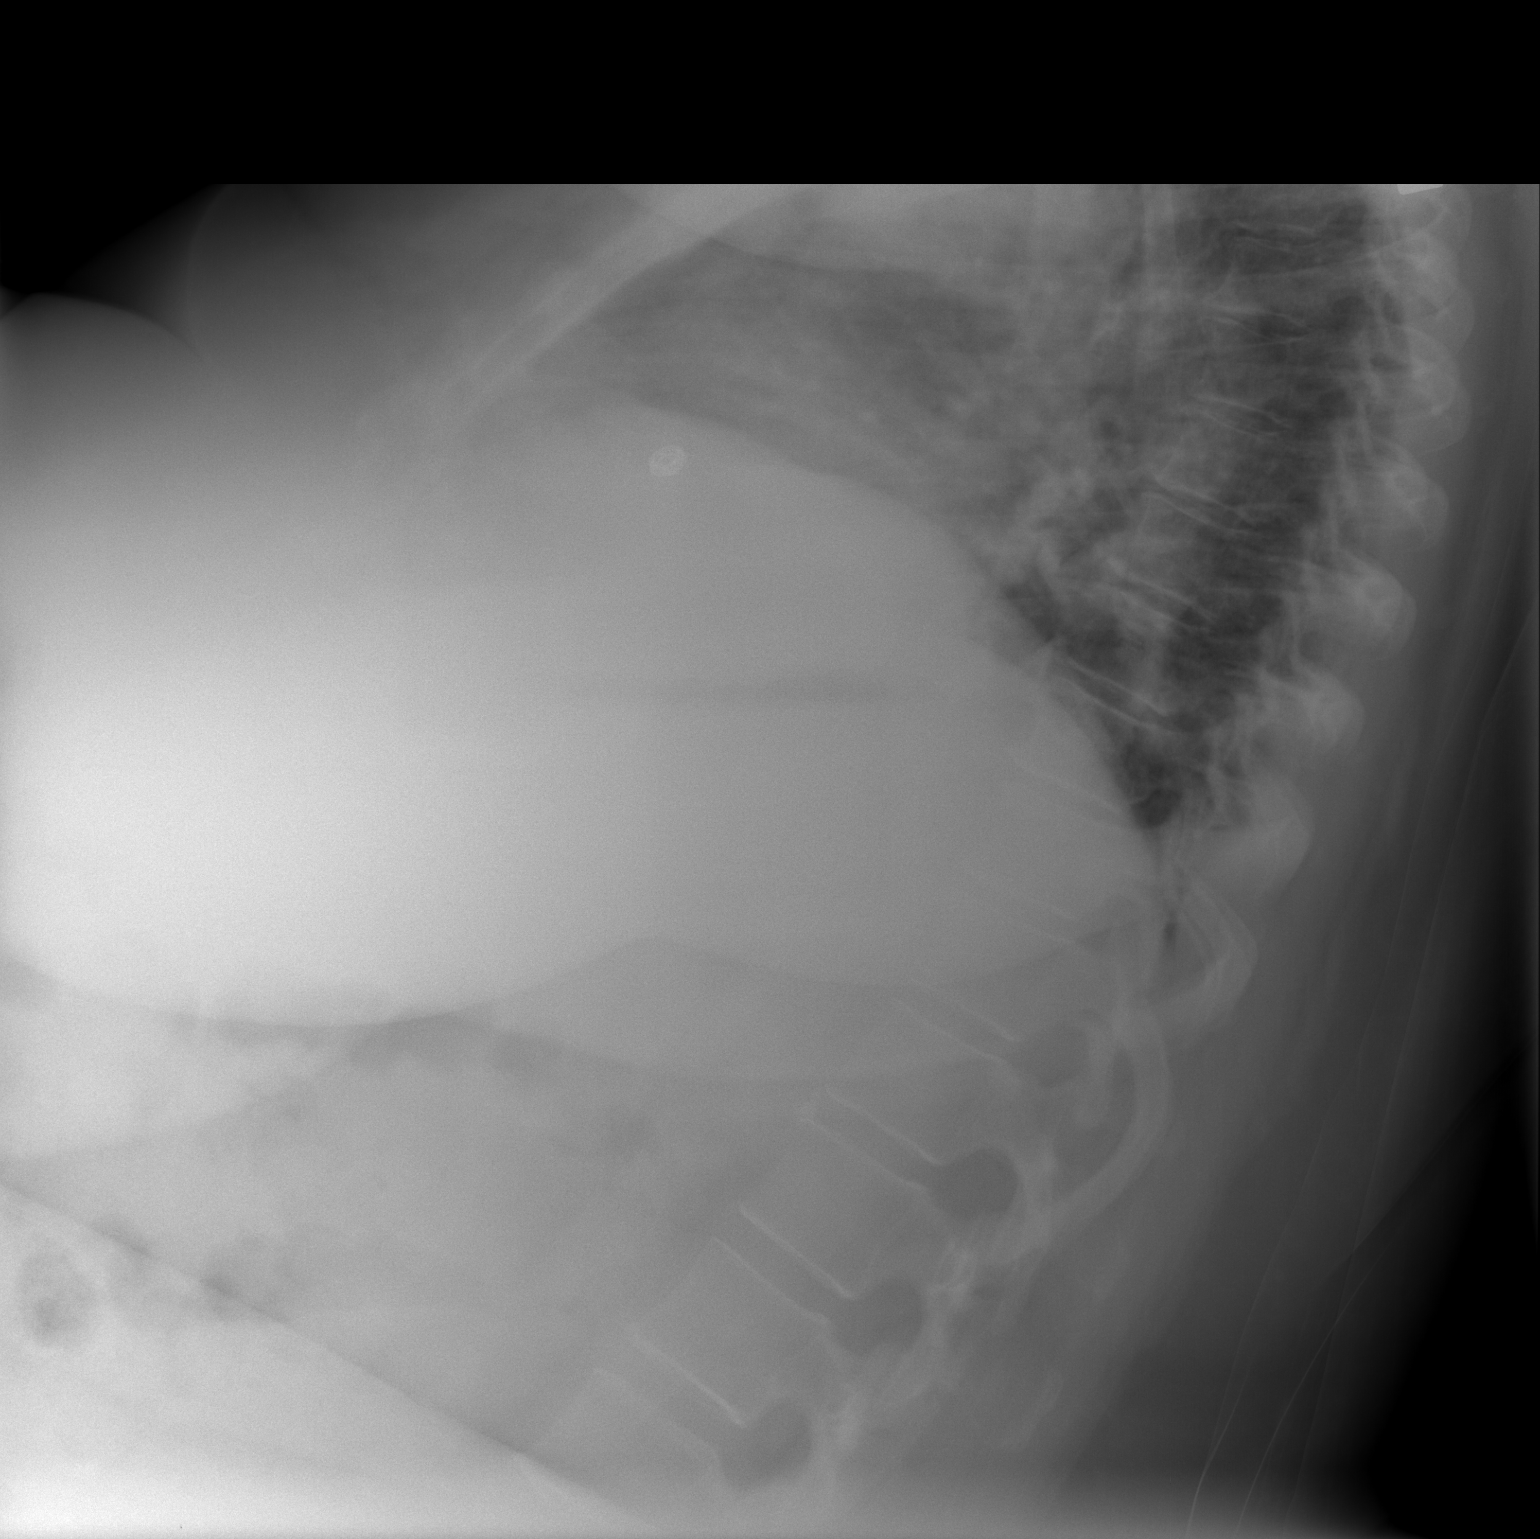

[view not recorded]
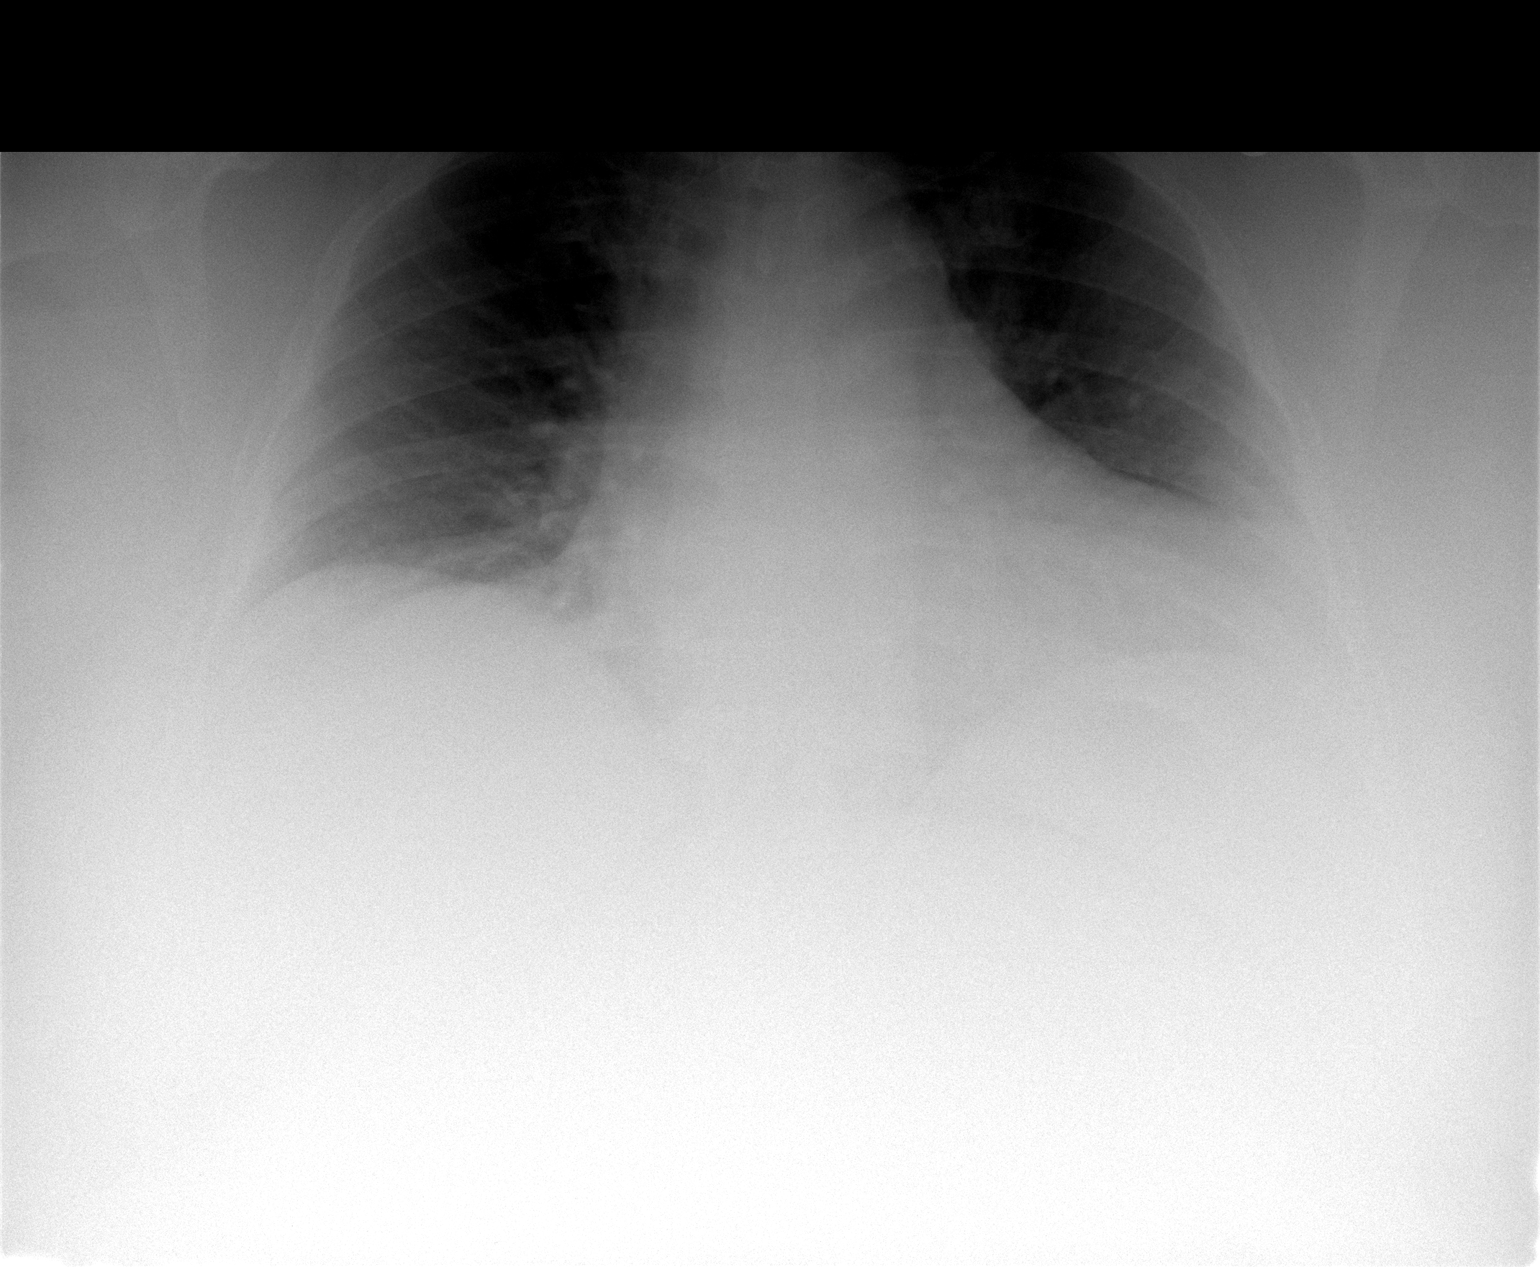

[2 of 2 positions shown; findings below may reference images not displayed]

FINDINGS: Poor inspiration.  Stable enlarged cardiac silhouette.
Clear lungs with normal vascularity.  Unremarkable bones.
IMPRESSION: Stable cardiomegaly.  No acute abnormality.

## 2012-04-28 IMAGING — CR DG CHEST 2V
3 series · 3 of 3 positions shown · non-contrast
Comparison: 09/17/2009

CLINICAL DATA: Shortness of breath.  Productive cough.

CHEST - 2 VIEW

[w chest lat]
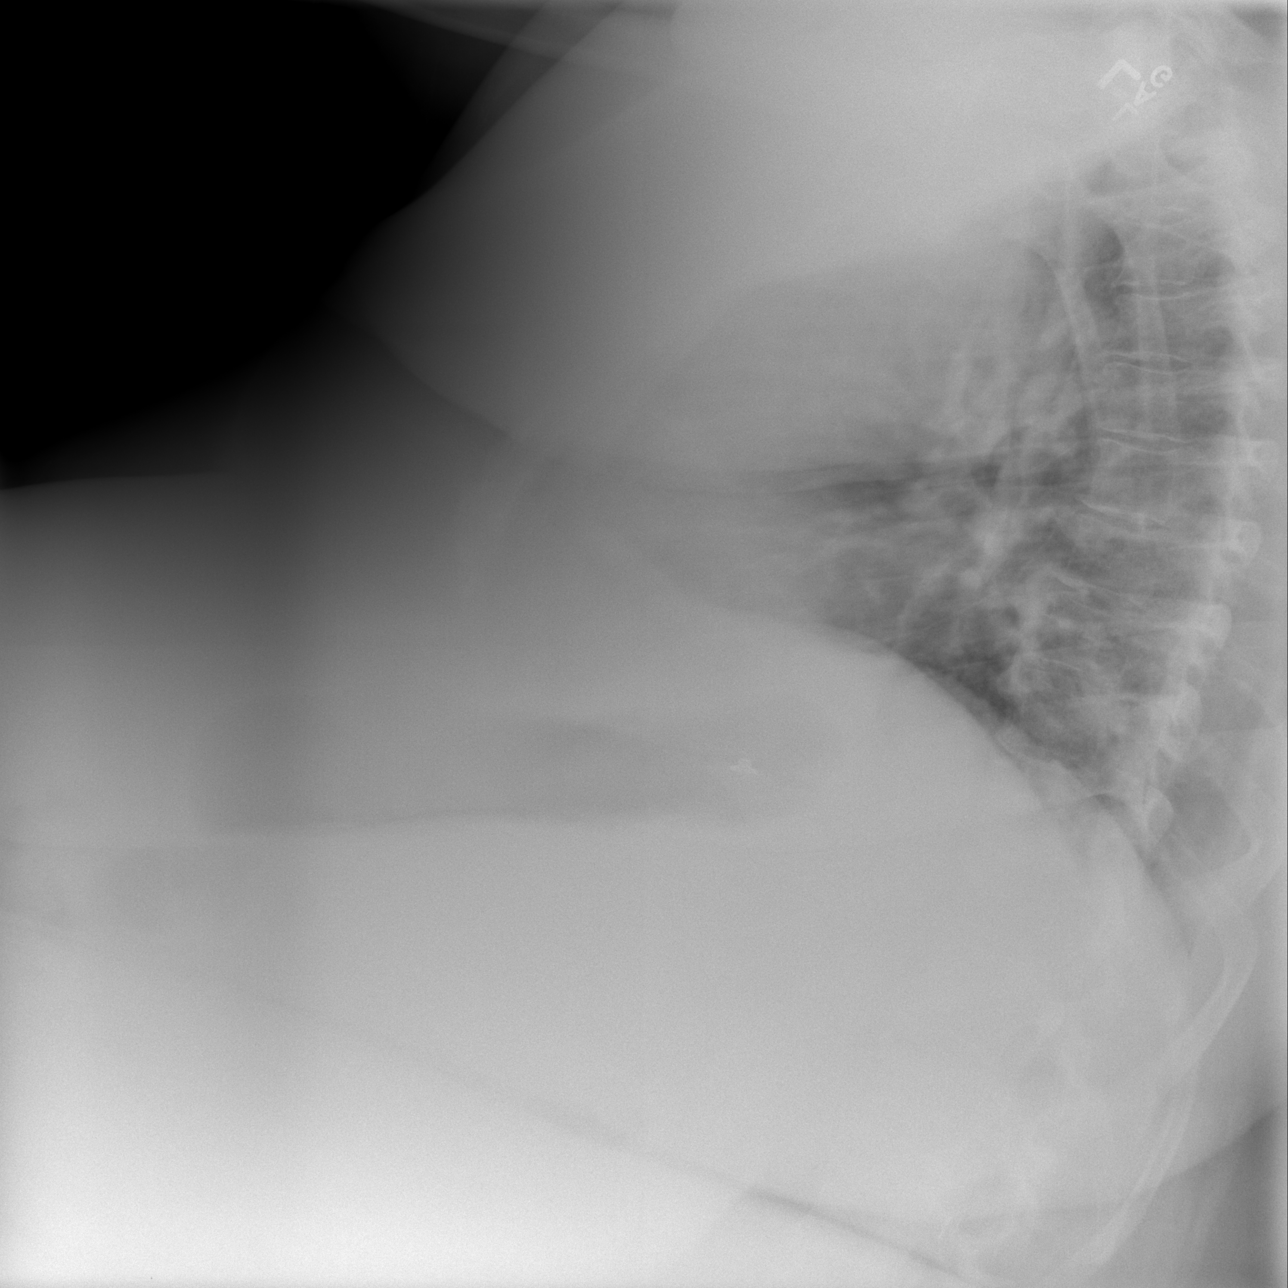

[w chest lat *]
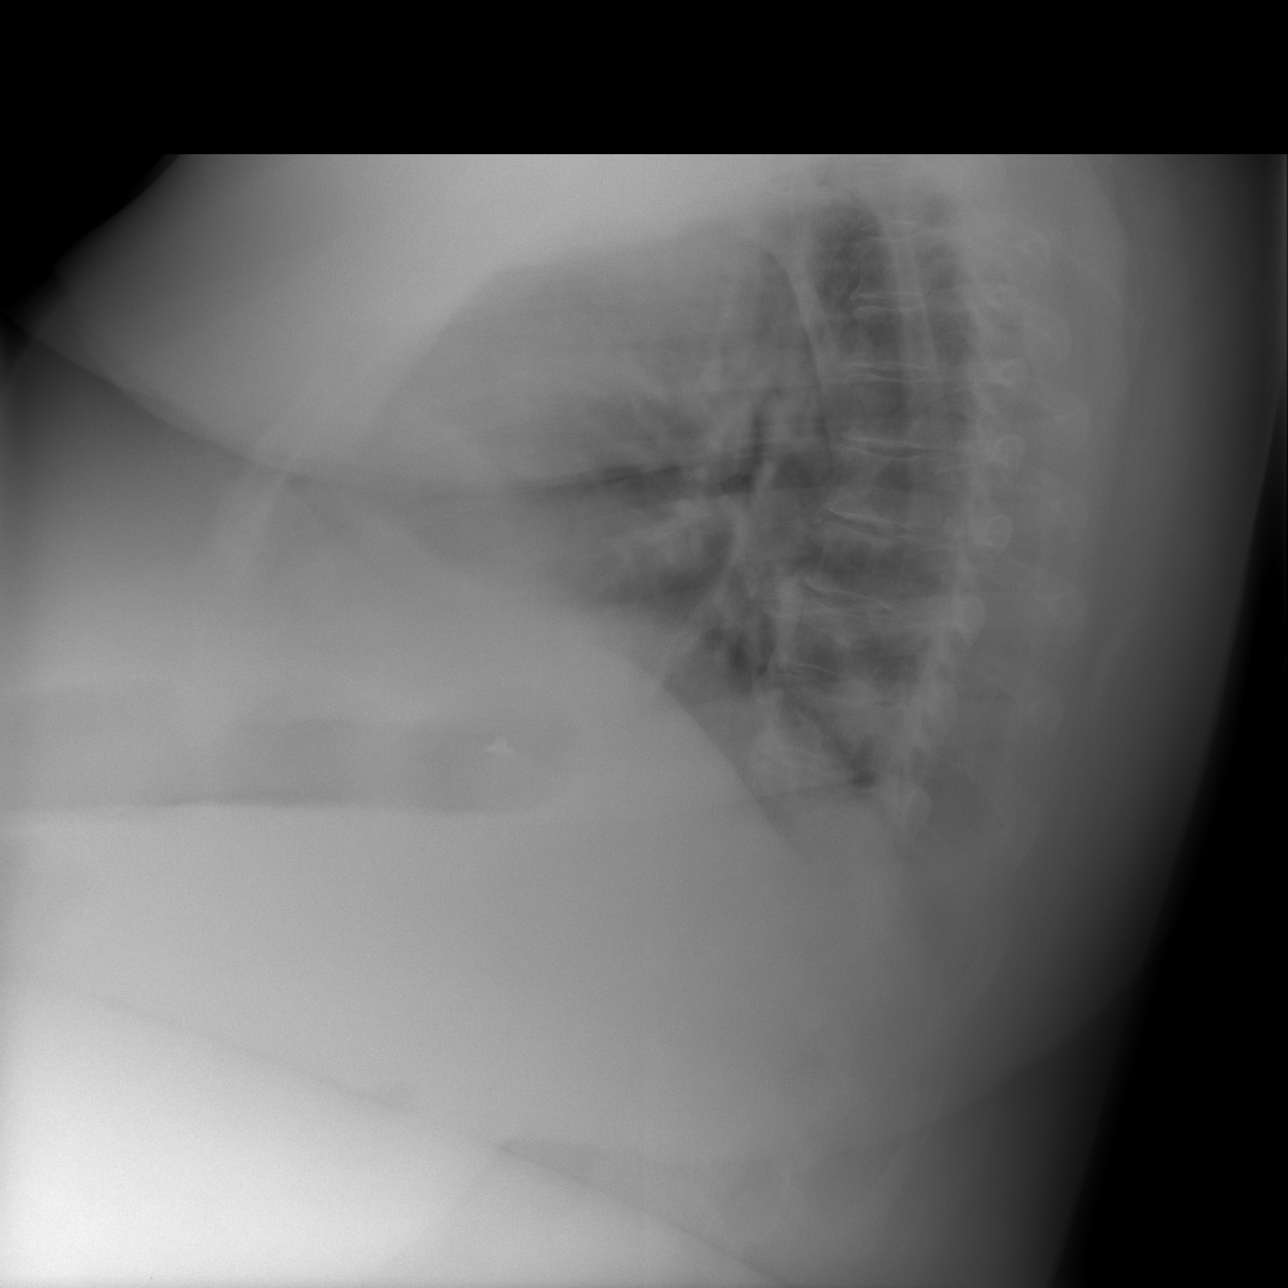

[view not recorded]
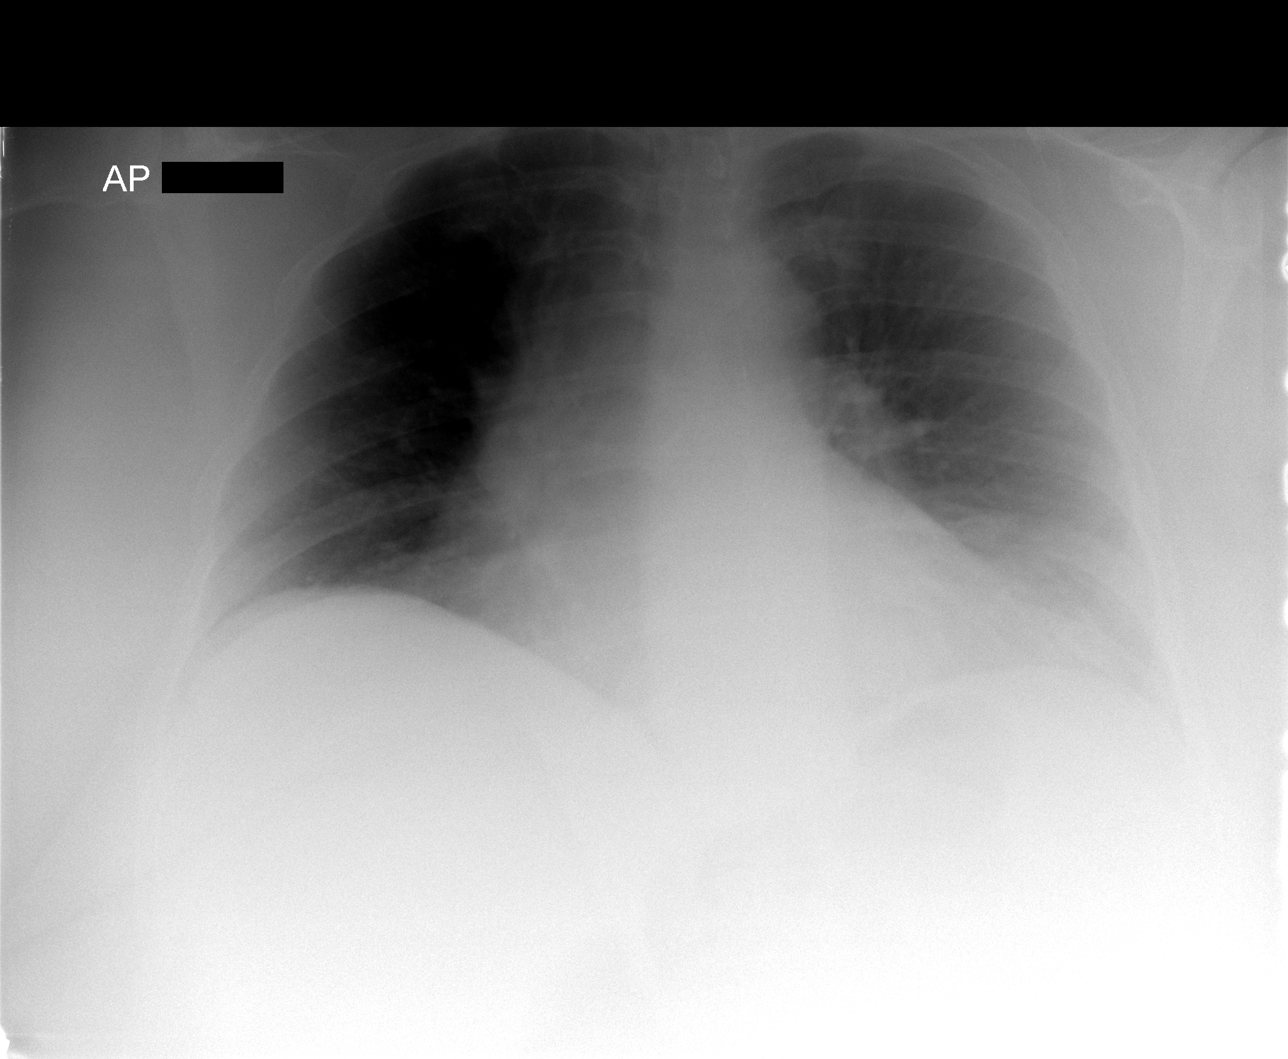

[3 of 3 positions shown; findings below may reference images not displayed]

FINDINGS: Technical factors related to patient body habitus reduce
diagnostic sensitivity and specificity.

The patient is rotated to the right on today's exam, resulting in
reduced diagnostic sensitivity and specificity.  Mild cardiomegaly
noted. Low lung volumes are present, causing crowding of the
pulmonary vasculature.  The lungs appear grossly clear.
IMPRESSION: 1.  Cardiomegaly without edema.

## 2012-05-09 ENCOUNTER — Observation Stay (HOSPITAL_COMMUNITY)
Admission: EM | Admit: 2012-05-09 | Discharge: 2012-05-11 | Disposition: A | Discharge status: Home / Self Care | Payer: Medicaid Other | Attending: Internal Medicine | Admitting: Internal Medicine

## 2012-05-09 ENCOUNTER — Emergency Department (HOSPITAL_COMMUNITY): Payer: Medicaid Other

## 2012-05-09 ENCOUNTER — Encounter (HOSPITAL_COMMUNITY): Payer: Self-pay | Admitting: Emergency Medicine

## 2012-05-09 DIAGNOSIS — L0293 Carbuncle, unspecified: Secondary | ICD-10-CM

## 2012-05-09 DIAGNOSIS — N189 Chronic kidney disease, unspecified: Secondary | ICD-10-CM | POA: Insufficient documentation

## 2012-05-09 DIAGNOSIS — L270 Generalized skin eruption due to drugs and medicaments taken internally: Secondary | ICD-10-CM

## 2012-05-09 DIAGNOSIS — J189 Pneumonia, unspecified organism: Secondary | ICD-10-CM

## 2012-05-09 DIAGNOSIS — E662 Morbid (severe) obesity with alveolar hypoventilation: Secondary | ICD-10-CM | POA: Diagnosis present

## 2012-05-09 DIAGNOSIS — E860 Dehydration: Secondary | ICD-10-CM

## 2012-05-09 DIAGNOSIS — N179 Acute kidney failure, unspecified: Secondary | ICD-10-CM | POA: Insufficient documentation

## 2012-05-09 DIAGNOSIS — J441 Chronic obstructive pulmonary disease with (acute) exacerbation: Secondary | ICD-10-CM | POA: Insufficient documentation

## 2012-05-09 DIAGNOSIS — L739 Follicular disorder, unspecified: Secondary | ICD-10-CM

## 2012-05-09 DIAGNOSIS — K219 Gastro-esophageal reflux disease without esophagitis: Secondary | ICD-10-CM | POA: Insufficient documentation

## 2012-05-09 DIAGNOSIS — R0902 Hypoxemia: Secondary | ICD-10-CM | POA: Insufficient documentation

## 2012-05-09 DIAGNOSIS — Q828 Other specified congenital malformations of skin: Secondary | ICD-10-CM

## 2012-05-09 DIAGNOSIS — Z5900 Homelessness unspecified: Secondary | ICD-10-CM | POA: Insufficient documentation

## 2012-05-09 DIAGNOSIS — Z79899 Other long term (current) drug therapy: Secondary | ICD-10-CM | POA: Insufficient documentation

## 2012-05-09 DIAGNOSIS — J9600 Acute respiratory failure, unspecified whether with hypoxia or hypercapnia: Principal | ICD-10-CM | POA: Insufficient documentation

## 2012-05-09 DIAGNOSIS — I129 Hypertensive chronic kidney disease with stage 1 through stage 4 chronic kidney disease, or unspecified chronic kidney disease: Secondary | ICD-10-CM | POA: Insufficient documentation

## 2012-05-09 DIAGNOSIS — M549 Dorsalgia, unspecified: Secondary | ICD-10-CM | POA: Insufficient documentation

## 2012-05-09 DIAGNOSIS — G4700 Insomnia, unspecified: Secondary | ICD-10-CM

## 2012-05-09 DIAGNOSIS — R0602 Shortness of breath: Secondary | ICD-10-CM | POA: Diagnosis present

## 2012-05-09 DIAGNOSIS — G8929 Other chronic pain: Secondary | ICD-10-CM

## 2012-05-09 DIAGNOSIS — Z7982 Long term (current) use of aspirin: Secondary | ICD-10-CM | POA: Insufficient documentation

## 2012-05-09 DIAGNOSIS — R197 Diarrhea, unspecified: Secondary | ICD-10-CM

## 2012-05-09 DIAGNOSIS — E785 Hyperlipidemia, unspecified: Secondary | ICD-10-CM | POA: Insufficient documentation

## 2012-05-09 DIAGNOSIS — I10 Essential (primary) hypertension: Secondary | ICD-10-CM

## 2012-05-09 DIAGNOSIS — J45909 Unspecified asthma, uncomplicated: Secondary | ICD-10-CM

## 2012-05-09 DIAGNOSIS — G4733 Obstructive sleep apnea (adult) (pediatric): Secondary | ICD-10-CM | POA: Insufficient documentation

## 2012-05-09 DIAGNOSIS — N3941 Urge incontinence: Secondary | ICD-10-CM

## 2012-05-09 DIAGNOSIS — Z8614 Personal history of Methicillin resistant Staphylococcus aureus infection: Secondary | ICD-10-CM | POA: Insufficient documentation

## 2012-05-09 LAB — CBC WITH DIFFERENTIAL/PLATELET
Basophils Absolute: 0 10*3/uL (ref 0.0–0.1)
Eosinophils Relative: 5 % (ref 0–5)
Lymphocytes Relative: 23 % (ref 12–46)
Lymphs Abs: 2.3 10*3/uL (ref 0.7–4.0)
MCV: 92.2 fL (ref 78.0–100.0)
Neutro Abs: 6.6 10*3/uL (ref 1.7–7.7)
Platelets: 274 10*3/uL (ref 150–400)
RBC: 4.77 MIL/uL (ref 3.87–5.11)
RDW: 15 % (ref 11.5–15.5)
WBC: 9.9 10*3/uL (ref 4.0–10.5)

## 2012-05-09 LAB — COMPREHENSIVE METABOLIC PANEL
ALT: 11 U/L (ref 0–35)
AST: 16 U/L (ref 0–37)
Alkaline Phosphatase: 53 U/L (ref 39–117)
Alkaline Phosphatase: 56 U/L (ref 39–117)
BUN: 17 mg/dL (ref 6–23)
CO2: 32 mEq/L (ref 19–32)
CO2: 32 mEq/L (ref 19–32)
Chloride: 99 mEq/L (ref 96–112)
Chloride: 101 mEq/L (ref 96–112)
GFR calc Af Amer: 65 mL/min — ABNORMAL LOW (ref >90)
GFR calc Af Amer: 62 mL/min — ABNORMAL LOW (ref >90)
GFR calc non Af Amer: 56 mL/min — ABNORMAL LOW (ref >90)
Glucose, Bld: 90 mg/dL (ref 70–99)
Glucose, Bld: 92 mg/dL (ref 70–99)
Potassium: 4 mEq/L (ref 3.5–5.1)
Potassium: 3.9 mEq/L (ref 3.5–5.1)
Sodium: 136 mEq/L (ref 135–145)
Total Bilirubin: 0.3 mg/dL (ref 0.3–1.2)
Total Bilirubin: 0.3 mg/dL (ref 0.3–1.2)

## 2012-05-09 LAB — CBC
MCV: 92.5 fL (ref 78.0–100.0)
Platelets: 260 10*3/uL (ref 150–400)
RDW: 15.2 % (ref 11.5–15.5)
WBC: 8.9 10*3/uL (ref 4.0–10.5)

## 2012-05-09 LAB — GLUCOSE, CAPILLARY: Glucose-Capillary: 114 mg/dL — ABNORMAL HIGH (ref 70–99)

## 2012-05-09 LAB — HEMOGLOBIN A1C: Mean Plasma Glucose: 117 mg/dL — ABNORMAL HIGH (ref <117)

## 2012-05-09 LAB — TROPONIN I: Troponin I: <0.3 ng/mL (ref <0.30)

## 2012-05-09 LAB — POCT I-STAT TROPONIN I

## 2012-05-09 MED ORDER — SODIUM CHLORIDE 0.9 % IJ SOLN
3.0000 mL | Freq: Two times a day (BID) | INTRAMUSCULAR | Status: DC
  Administered 2012-05-09 – 2012-05-11 (×2): 3 mL via INTRAVENOUS

## 2012-05-09 MED ORDER — IPRATROPIUM BROMIDE 0.02 % IN SOLN
0.5000 mg | RESPIRATORY_TRACT | Status: DC
  Administered 2012-05-09 – 2012-05-11 (×12): 0.5 mg via RESPIRATORY_TRACT
  Filled 2012-05-09 (×11): qty 2.5

## 2012-05-09 MED ORDER — PANTOPRAZOLE SODIUM 40 MG PO TBEC
40.0000 mg | DELAYED_RELEASE_TABLET | Freq: Every day | ORAL | Status: DC
  Administered 2012-05-09 – 2012-05-11 (×3): 40 mg via ORAL
  Filled 2012-05-09 (×2): qty 1

## 2012-05-09 MED ORDER — GUAIFENESIN ER 600 MG PO TB12
600.0000 mg | ORAL_TABLET | Freq: Two times a day (BID) | ORAL | Status: DC
  Administered 2012-05-09 – 2012-05-11 (×5): 600 mg via ORAL
  Filled 2012-05-09 (×6): qty 1

## 2012-05-09 MED ORDER — HYDROCODONE-ACETAMINOPHEN 5-325 MG PO TABS
1.0000 | ORAL_TABLET | ORAL | Status: DC | PRN
  Administered 2012-05-09: 2 via ORAL
  Filled 2012-05-09: qty 2

## 2012-05-09 MED ORDER — ASPIRIN 325 MG PO TABS
325.0000 mg | ORAL_TABLET | Freq: Every day | ORAL | Status: DC
  Administered 2012-05-09 – 2012-05-11 (×3): 325 mg via ORAL
  Filled 2012-05-09 (×3): qty 1

## 2012-05-09 MED ORDER — SODIUM CHLORIDE 0.9 % IV SOLN: 250.0000 mL | INTRAVENOUS | Status: DC | PRN

## 2012-05-09 MED ORDER — ENOXAPARIN SODIUM 40 MG/0.4ML ~~LOC~~ SOLN
40.0000 mg | SUBCUTANEOUS | Status: DC
  Filled 2012-05-09 (×4): qty 0.4

## 2012-05-09 MED ORDER — CLONIDINE HCL 0.2 MG PO TABS
0.2000 mg | ORAL_TABLET | Freq: Two times a day (BID) | ORAL | Status: DC
  Administered 2012-05-09 – 2012-05-11 (×4): 0.2 mg via ORAL
  Filled 2012-05-09 (×6): qty 1

## 2012-05-09 MED ORDER — AMITRIPTYLINE HCL 75 MG PO TABS
75.0000 mg | ORAL_TABLET | Freq: Every day | ORAL | Status: DC
Start: 2012-05-09 — End: 2012-05-11
  Administered 2012-05-09 – 2012-05-10 (×2): 75 mg via ORAL
  Filled 2012-05-09 (×3): qty 1

## 2012-05-09 MED ORDER — ONDANSETRON HCL 4 MG/2ML IJ SOLN: 4.0000 mg | Freq: Four times a day (QID) | INTRAMUSCULAR | Status: DC | PRN

## 2012-05-09 MED ORDER — IPRATROPIUM BROMIDE 0.02 % IN SOLN
0.5000 mg | Freq: Once | RESPIRATORY_TRACT | Status: AC
  Administered 2012-05-09: 0.5 mg via RESPIRATORY_TRACT
  Filled 2012-05-09: qty 2.5

## 2012-05-09 MED ORDER — SODIUM CHLORIDE 0.9 % IJ SOLN: 3.0000 mL | INTRAMUSCULAR | Status: DC | PRN

## 2012-05-09 MED ORDER — FUROSEMIDE 10 MG/ML IJ SOLN
40.0000 mg | Freq: Once | INTRAMUSCULAR | Status: AC
  Administered 2012-05-09: 40 mg via INTRAVENOUS
  Filled 2012-05-09: qty 4

## 2012-05-09 MED ORDER — MOMETASONE FURO-FORMOTEROL FUM 100-5 MCG/ACT IN AERO
2.0000 | INHALATION_SPRAY | Freq: Two times a day (BID) | RESPIRATORY_TRACT | Status: DC
  Administered 2012-05-09 – 2012-05-11 (×5): 2 via RESPIRATORY_TRACT
  Filled 2012-05-09: qty 8.8

## 2012-05-09 MED ORDER — ALBUTEROL SULFATE (5 MG/ML) 0.5% IN NEBU
5.0000 mg | INHALATION_SOLUTION | Freq: Once | RESPIRATORY_TRACT | Status: AC
  Administered 2012-05-09: 5 mg via RESPIRATORY_TRACT
  Filled 2012-05-09: qty 40

## 2012-05-09 MED ORDER — ONDANSETRON HCL 4 MG PO TABS: 4.0000 mg | ORAL_TABLET | Freq: Four times a day (QID) | ORAL | Status: DC | PRN

## 2012-05-09 MED ORDER — FUROSEMIDE 40 MG PO TABS
40.0000 mg | ORAL_TABLET | Freq: Every day | ORAL | Status: DC
Start: 2012-05-09 — End: 2012-05-11
  Administered 2012-05-09 – 2012-05-11 (×3): 40 mg via ORAL
  Filled 2012-05-09 (×3): qty 1

## 2012-05-09 MED ORDER — ACETAMINOPHEN 325 MG PO TABS: 650.0000 mg | ORAL_TABLET | Freq: Four times a day (QID) | ORAL | Status: DC | PRN

## 2012-05-09 MED ORDER — DIPHENHYDRAMINE HCL 25 MG PO CAPS
25.0000 mg | ORAL_CAPSULE | Freq: Four times a day (QID) | ORAL | Status: DC | PRN
  Administered 2012-05-09 – 2012-05-11 (×4): 25 mg via ORAL
  Filled 2012-05-09 (×4): qty 1

## 2012-05-09 MED ORDER — DARIFENACIN HYDROBROMIDE ER 7.5 MG PO TB24
7.5000 mg | ORAL_TABLET | Freq: Every day | ORAL | Status: DC
  Administered 2012-05-09 – 2012-05-11 (×3): 7.5 mg via ORAL
  Filled 2012-05-09 (×3): qty 1

## 2012-05-09 MED ORDER — SODIUM CHLORIDE 0.9 % IJ SOLN
3.0000 mL | Freq: Two times a day (BID) | INTRAMUSCULAR | Status: DC
  Administered 2012-05-09 – 2012-05-10 (×4): 3 mL via INTRAVENOUS

## 2012-05-09 MED ORDER — ALBUTEROL SULFATE (5 MG/ML) 0.5% IN NEBU
2.5000 mg | INHALATION_SOLUTION | RESPIRATORY_TRACT | Status: DC | PRN
  Administered 2012-05-10: 2.5 mg via RESPIRATORY_TRACT
  Filled 2012-05-09: qty 0.5

## 2012-05-09 MED ORDER — GUAIFENESIN-DM 100-10 MG/5ML PO SYRP
5.0000 mL | ORAL_SOLUTION | Freq: Three times a day (TID) | ORAL | Status: DC | PRN
  Administered 2012-05-10 – 2012-05-11 (×2): 5 mL via ORAL
  Filled 2012-05-09 (×2): qty 5

## 2012-05-09 MED ORDER — HYDROCODONE-ACETAMINOPHEN 5-325 MG PO TABS
2.0000 | ORAL_TABLET | ORAL | Status: DC | PRN
  Administered 2012-05-09 – 2012-05-11 (×8): 2 via ORAL
  Filled 2012-05-09 (×8): qty 2

## 2012-05-09 MED ORDER — LUBIPROSTONE 24 MCG PO CAPS
24.0000 ug | ORAL_CAPSULE | Freq: Every evening | ORAL | Status: DC
  Filled 2012-05-09 (×3): qty 1

## 2012-05-09 MED ORDER — ATORVASTATIN CALCIUM 20 MG PO TABS
20.0000 mg | ORAL_TABLET | Freq: Every day | ORAL | Status: DC
  Administered 2012-05-09 – 2012-05-11 (×3): 20 mg via ORAL
  Filled 2012-05-09 (×3): qty 1

## 2012-05-09 MED ORDER — DOCUSATE SODIUM 100 MG PO CAPS
100.0000 mg | ORAL_CAPSULE | Freq: Two times a day (BID) | ORAL | Status: DC
  Administered 2012-05-09 – 2012-05-10 (×4): 100 mg via ORAL
  Filled 2012-05-09 (×6): qty 1

## 2012-05-09 MED ORDER — ALPRAZOLAM 0.5 MG PO TABS
1.0000 mg | ORAL_TABLET | Freq: Every evening | ORAL | Status: DC | PRN
  Administered 2012-05-09: 1 mg via ORAL
  Filled 2012-05-09: qty 2

## 2012-05-09 MED ORDER — ACETAMINOPHEN 650 MG RE SUPP: 650.0000 mg | Freq: Four times a day (QID) | RECTAL | Status: DC | PRN

## 2012-05-09 MED ORDER — PREDNISONE 20 MG PO TABS
40.0000 mg | ORAL_TABLET | Freq: Every day | ORAL | Status: DC
  Administered 2012-05-10 – 2012-05-11 (×2): 40 mg via ORAL
  Filled 2012-05-09 (×3): qty 2

## 2012-05-09 NOTE — Progress Notes (Signed)
Dr. Cathren Laine updated via text on positive MRSA by  PCR screen. Thanks Carlynn Spry RN

## 2012-05-09 NOTE — ED Provider Notes (Signed)
History     CSN: [id number]  Arrival date & time 05/09/12  0132   First MD Initiated Contact with Patient 05/09/12 0236      Chief Complaint  Patient presents with  . Asthma    (Consider location/radiation/quality/duration/timing/severity/associated sxs/prior treatment) Patient is a 47 y.o. female presenting with asthma. The history is provided by the patient. No language interpreter was used.  Asthma This is a recurrent problem. The current episode started more than 2 days ago. The problem occurs constantly. The problem has not changed since onset.Associated symptoms include shortness of breath. Pertinent negatives include no chest pain, no abdominal pain and no headaches. Nothing aggravates the symptoms. Nothing relieves the symptoms. She has tried nothing for the symptoms. The treatment provided no relief.    Past Medical History  Diagnosis Date  . Asthma   . GERD (gastroesophageal reflux disease)   . Hypertension   . Homelessness   . Low back pain   . 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 Arthritis   . Gout   . Hyperlipemia   . Morbid obesity     uses motor wheel chair  . MRSA (methicillin resistant Staphylococcus aureus)     states about a year ago    Past Surgical History  Procedure Laterality Date  . Tubal ligation    . Cesarean section      Family History  Problem Relation Age of Onset  . Asthma Mother   . Diabetes Father   . Heart disease Father   . Stroke Father     History  Substance Use Topics  . Smoking status: Former Smoker    Types: Cigarettes    Quit date: 03/25/2003  . Smokeless tobacco: Former Systems developer    Quit date: 05/27/1978  . Alcohol Use: No    OB History   Grav Para Term Preterm Abortions TAB SAB Ect Mult Living                  Review of Systems  Respiratory: Positive for cough, shortness of breath and wheezing.   Cardiovascular: Negative for chest pain.  Gastrointestinal: Negative for abdominal pain.   Neurological: Negative for headaches.  All other systems reviewed and are negative.    Allergies  Ibuprofen; Adhesive; Azithromycin; Doxycycline; Sulfa antibiotics; and Sulfamethoxazole w-trimethoprim  Home Medications   Current Outpatient Rx  Name  Route  Sig  Dispense  Refill  . albuterol (PROVENTIL HFA) 108 (90 BASE) MCG/ACT inhaler   Inhalation   Inhale 2 puffs into the lungs every 4 (four) hours as needed. For asthma relief         . albuterol (PROVENTIL) (2.5 MG/3ML) 0.083% nebulizer solution   Nebulization   Take 3 mLs (2.5 mg total) by nebulization every 6 (six) hours as needed for wheezing.   75 mL   12   . ALPRAZolam (XANAX) 1 MG tablet   Oral   Take 1 tablet (1 mg total) by mouth at bedtime as needed.   30 tablet   0   . amitriptyline (ELAVIL) 75 MG tablet   Oral   Take 1 tablet (75 mg total) by mouth at bedtime.   30 tablet   0   . ammonium lactate (AMLACTIN) 12 % cream   Topical   Apply 1 g topically as needed. Applies to back and legs.Marland KitchenMarland KitchenIn a thin layer on affected area for dry patches.         [name] aspirin  325 MG tablet   Oral   Take 325 mg by mouth daily.         [name] atorvastatin (LIPITOR) 20 MG tablet   Oral   Take 20 mg by mouth daily.         . cloNIDine (CATAPRES) 0.2 MG tablet   Oral   Take 0.2 mg by mouth 2 (two) times daily.         . diphenhydrAMINE (BENADRYL) 25 mg capsule   Oral   Take 25 mg by mouth every 6 (six) hours as needed. For itching         . Emollient (EUCERIN) lotion   Topical   Apply topically 2 (two) times daily as needed. On affected areas for dry patches.         . famotidine (PEPCID) 20 MG tablet   Oral   Take 1 tablet (20 mg total) by mouth 2 (two) times daily.   30 tablet   0   . furosemide (LASIX) 40 MG tablet   Oral   Take 1 tablet (40 mg total) by mouth daily.   30 tablet      . guaiFENesin-dextromethorphan (ROBITUSSIN DM) 100-10 MG/5ML syrup   Oral   Take 5 mLs by mouth 3 (three) times  daily as needed for cough.   240 mL   0   . HYDROcodone-acetaminophen (NORCO) 10-325 MG per tablet   Oral   Take 1 tablet by mouth every 6 (six) hours as needed. For pain   20 tablet   0   . ipratropium (ATROVENT) 0.02 % nebulizer solution   Nebulization   Take 2.5 mLs (0.5 mg total) by nebulization every 4 (four) hours.   75 mL   1   . ketoconazole (NIZORAL) 2 % cream   Topical   Apply topically daily.         [name] ketoconazole (NIZORAL) 2 % shampoo   Topical   Apply 1 application topically 2 (two) times a week.         [name] ketoconazole (NIZORAL) 200 MG tablet   Oral   Take 200 mg by mouth daily.         [name] lubiprostone (AMITIZA) 24 MCG capsule   Oral   Take 24 mcg by mouth every evening.          [name] omega-3 acid ethyl esters (LOVAZA) 1 G capsule   Oral   Take 2 g by mouth 2 (two) times daily.         . pantoprazole (PROTONIX) 40 MG tablet   Oral   Take 40 mg by mouth daily.         . promethazine (PHENERGAN) 25 MG tablet   Oral   Take 25 mg by mouth every 6 (six) hours as needed. For nausea/vomiting         . solifenacin (VESICARE) 5 MG tablet   Oral   Take 5 mg by mouth 2 (two) times daily.         [name] VITAMIN E PO   Oral   Take 1 tablet by mouth daily.         [name] levofloxacin (LEVAQUIN) 750 MG tablet   Oral   Take 1 tablet (750 mg total) by mouth daily.   7 tablet   0     BP 123/90  Pulse 64  Temp(Src) 99.1 F (37.3 C)  Resp 20  SpO2 94%  LMP 03/24/2009  Physical Exam  Constitutional: She appears  well-developed and well-nourished. No distress.  HENT:  Head: Normocephalic and atraumatic.  Mouth/Throat: Oropharynx is clear and moist.  Eyes: Conjunctivae are normal. Pupils are equal, round, and reactive to light.  Neck: Normal range of motion. Neck supple.  Cardiovascular: Normal rate, regular rhythm and intact distal pulses.   Pulmonary/Chest: She has decreased breath sounds.  Abdominal: Soft. Bowel sounds are normal. There is no  tenderness. There is no rebound and no guarding.  Skin: Skin is warm and dry.  Crusted diffuse lesions   Psychiatric: She has a normal mood and affect.    ED Course  Procedures (including critical care time)  Labs Reviewed  COMPREHENSIVE METABOLIC PANEL - Abnormal; Notable for the following:    Creatinine, Ser 1.14 (*)    Albumin 3.3 (*)    GFR calc non Af Amer 56 (*)    GFR calc Af Amer 65 (*)    All other components within normal limits  PRO B NATRIURETIC PEPTIDE - Abnormal; Notable for the following:    Pro B Natriuretic peptide (BNP) 168.6 (*)    All other components within normal limits  CBC WITH DIFFERENTIAL   No results found.   No diagnosis found.    MDM  Will admit        Lyniah Fujita K Laquana Villari-Rasch, MD 05/09/12 [phone]

## 2012-05-09 NOTE — Progress Notes (Signed)
Called by the nurse to evaluate the patient for expiratory wheezing and low oxygen saturations. Patient is on chronic oxygen therapy 3 litres at home.  BP 97/62  Pulse 73  Temp(Src) 98.7 F (37.1 C) (Oral)  Resp 20  Ht 5\' 5"  (1.651 m)  Wt 391 lb 1.6 oz (177.402 kg)  BMI 65.08 kg/m2  SpO2 93%  LMP 03/24/2009 She is currently saturating 93% which is completely acceptable for a chronic COPD patient. Patient requested me to change her pain medications to IV but on asking what pain does she have, she was vague about it.  Physical Exam: General: Vital signs reviewed and noted. Well-developed, well-nourished, in no acute distress; alert, appropriate and cooperative throughout examination.  Lungs:  Normal respiratory effort. Clear to auscultation BL without crackles or wheezes.  Heart: RRR. S1 and S2 normal without gallop, murmur, or rubs.  Abdomen:  BS normoactive. Soft, Nondistended, non-tender.  No masses or organomegaly.  Neurologic: A&O X3, CN II - XII are grossly intact. Motor strength is 5/5 in the all 4 extremities, Sensations intact to light touch, Cerebellar signs negative.  Skin: No visible rashes, scars.   Plan: -Continue current treatment. -Norco changed to 2 tabs q4h for pain as needed -CPAP at night -Consider steroids for asthma excacerbation if her breathing gets worse, at this time, i could not appreciate much wheezing.  Janell Quiet MD Pager [phone]

## 2012-05-09 NOTE — Progress Notes (Signed)
  Echocardiogram 2D Echocardiogram has been performed.  Kara Vincent 05/09/2012, 3:07 PM

## 2012-05-09 NOTE — Progress Notes (Signed)
Utilization review completed.  

## 2012-05-09 NOTE — Progress Notes (Signed)
PT refused her cpap, she said she just wanted to wear O2

## 2012-05-09 NOTE — ED Notes (Signed)
MD at bedside. 

## 2012-05-09 NOTE — ED Notes (Signed)
Pt has had a cough for the past two weeks with yellow sputum; pt become short of breath tonight; given a combivent treatment en route; pt recently had pneumonia two months ago; pt also has rash present to her chest and legs bilaterally; pt also reports weakness;

## 2012-05-09 NOTE — H&P (Signed)
PCP:  Harvie Junior, MD    Chief Complaint:   Shortness of breath  HPI: Kara Vincent is a 47 y.o. female   has a past medical history of Asthma; GERD (gastroesophageal reflux disease); Hypertension; Homelessness; Low back pain; Darier disease; Arthritis; Gout; Hyperlipemia; Morbid obesity; and MRSA (methicillin resistant Staphylococcus aureus).   Presented with  States her advance home care nurse advised her to call [phone] bc of increased swelling and shortness of breath.  She has been taking all of her medications. Denies any chest pain. She has had some cough for the past 2-3 weeks.  Endorses worsening leg  And arm swelling. She have had progressive shortness of breath for the past 2-3 weeks but it gotten very severe. She states her swelling has been stable.  She has history of OSA and is on CPAP for this. She is also on home oxygen 3L states due to asthma.   Review of Systems:    Pertinent positives include: chills shortness of breath at rest. dyspnea on exertion,  excess mucus, productive cough,   Bilateral lower extremity swelling   Constitutional:  No weight loss, night sweats, Fevers,, fatigue, weight loss  HEENT:  No headaches, Difficulty swallowing,Tooth/dental problems,Sore throat,  No sneezing, itching, ear ache, nasal congestion, post nasal drip,  Cardio-vascular:  No chest pain, Orthopnea, PND, anasarca, dizziness, palpitations.no GI:  No heartburn, indigestion, abdominal pain, nausea, vomiting, diarrhea, change in bowel habits, loss of appetite, melena, blood in stool, hematemesis Resp:  No non-productive cough, No coughing up of blood.No change in color of mucus.No wheezing. Skin:  no rash or lesions. No jaundice GU:  no dysuria, change in color of urine, no urgency or frequency. No straining to urinate.  No flank pain.  Musculoskeletal:  No joint pain or no joint swelling. No decreased range of motion. No back pain.  Psych:  No change in mood or affect.  No depression or anxiety. No memory loss.  Neuro: no localizing neurological complaints, no tingling, no weakness, no double vision, no gait abnormality, no slurred speech, no confusion  Otherwise ROS are negative except for above, 10 systems were reviewed  Past Medical History: Past Medical History  Diagnosis Date  . Asthma   . GERD (gastroesophageal reflux disease)   . Hypertension   . Homelessness   . Low back pain   . 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 Arthritis   . Gout   . Hyperlipemia   . Morbid obesity     uses motor wheel chair  . MRSA (methicillin resistant Staphylococcus aureus)     states about a year ago   Past Surgical History  Procedure Laterality Date  . Tubal ligation    . Cesarean section       Medications: Prior to Admission medications   Medication Sig Start Date End Date Taking? Authorizing Provider  albuterol (PROVENTIL HFA) 108 (90 BASE) MCG/ACT inhaler Inhale 2 puffs into the lungs every 4 (four) hours as needed. For asthma relief   Yes Historical Provider, MD  albuterol (PROVENTIL) (2.5 MG/3ML) 0.083% nebulizer solution Take 3 mLs (2.5 mg total) by nebulization every 6 (six) hours as needed for wheezing. 05/27/11 05/26/12 Yes Tatyana A Kirichenko, PA  ALPRAZolam (XANAX) 1 MG tablet Take 1 tablet (1 mg total) by mouth at bedtime as needed. 03/23/12  Yes Bobby Rumpf York, PA  amitriptyline (ELAVIL) 75 MG tablet Take 1 tablet (75 mg total) by mouth at bedtime. 03/23/12  Yes Bobby Rumpf York, PA  ammonium lactate (AMLACTIN) 12 % cream Apply 1 g topically as needed. Applies to back and legs.Marland KitchenMarland KitchenIn a thin layer on affected area for dry patches.   Yes Historical Provider, MD  aspirin 325 MG tablet Take 325 mg by mouth daily.   Yes Historical Provider, MD  atorvastatin (LIPITOR) 20 MG tablet Take 20 mg by mouth daily.   Yes Historical Provider, MD  cloNIDine (CATAPRES) 0.2 MG tablet Take 0.2 mg by mouth 2 (two) times daily.   Yes Historical Provider,  MD  diphenhydrAMINE (BENADRYL) 25 mg capsule Take 25 mg by mouth every 6 (six) hours as needed. For itching   Yes Historical Provider, MD  Emollient (EUCERIN) lotion Apply topically 2 (two) times daily as needed. On affected areas for dry patches.   Yes Historical Provider, MD  famotidine (PEPCID) 20 MG tablet Take 1 tablet (20 mg total) by mouth 2 (two) times daily. 06/15/11 06/14/12 Yes Garald Balding, NP  furosemide (LASIX) 40 MG tablet Take 1 tablet (40 mg total) by mouth daily. 01/01/12  Yes Charlynne Cousins, MD  guaiFENesin-dextromethorphan (ROBITUSSIN DM) 100-10 MG/5ML syrup Take 5 mLs by mouth 3 (three) times daily as needed for cough. 03/20/12  Yes Verlee Monte, MD  HYDROcodone-acetaminophen (NORCO) 10-325 MG per tablet Take 1 tablet by mouth every 6 (six) hours as needed. For pain 03/23/12  Yes Bobby Rumpf York, PA  ipratropium (ATROVENT) 0.02 % nebulizer solution Take 2.5 mLs (0.5 mg total) by nebulization every 4 (four) hours. 03/23/12  Yes Marianne L York, PA  ketoconazole (NIZORAL) 2 % cream Apply topically daily.   Yes Historical Provider, MD  ketoconazole (NIZORAL) 2 % shampoo Apply 1 application topically 2 (two) times a week.   Yes Historical Provider, MD  ketoconazole (NIZORAL) 200 MG tablet Take 200 mg by mouth daily.   Yes Historical Provider, MD  lubiprostone (AMITIZA) 24 MCG capsule Take 24 mcg by mouth every evening.    Yes Historical Provider, MD  omega-3 acid ethyl esters (LOVAZA) 1 G capsule Take 2 g by mouth 2 (two) times daily.   Yes Historical Provider, MD  pantoprazole (PROTONIX) 40 MG tablet Take 40 mg by mouth daily.   Yes Historical Provider, MD  promethazine (PHENERGAN) 25 MG tablet Take 25 mg by mouth every 6 (six) hours as needed. For nausea/vomiting   Yes Historical Provider, MD  solifenacin (VESICARE) 5 MG tablet Take 5 mg by mouth 2 (two) times daily.   Yes Historical Provider, MD  VITAMIN E PO Take 1 tablet by mouth daily.   Yes Historical Provider, MD   levofloxacin (LEVAQUIN) 750 MG tablet Take 1 tablet (750 mg total) by mouth daily. 03/23/12   Melton Alar, PA    Allergies:   Allergies  Allergen Reactions  . Ibuprofen Hives, Shortness Of Breath and Palpitations  . Adhesive (Tape) Rash  . Azithromycin Hives and Rash  . Doxycycline Rash  . Sulfa Antibiotics Rash  . Sulfamethoxazole W-Trimethoprim Rash    Social History:  Ambulatory with walker or scooter Lives at  Home    reports that she quit smoking about 9 years ago. Her smoking use included Cigarettes. She smoked 0.00 packs per day. She quit smokeless tobacco use about 33 years ago. She reports that she does not drink alcohol or use illicit drugs.   Family History: family history includes Asthma in her mother; Diabetes in her father; Heart disease in her father; and Stroke in her father.  Physical Exam: Patient Vitals for the past 24 hrs:  BP Temp Pulse Resp SpO2  05/09/12 0146 123/90 mmHg 99.1 F (37.3 C) 64 20 94 %    1. General:  in No Acute distress 2. Psychological: Alert and  Oriented 3. Head/ENT:   Moist  Mucous Membranes                          Head Non traumatic, neck supple                          Normal  Dentition 4. SKIN: normal  Skin turgor,  Skin clean Dry and intact, diffuse excoriated rash involving photo-sensitive area 5. Heart: Regular rate and rhythm no Murmur, Rub or gallop 6. Lungs:  no wheezes or crackles distant breath sounds due to body habitus 7. Abdomen: Soft, non-tender, Non distended 8. Lower extremities: no clubbing, cyanosis, trace  Edema but obese lower extremities 9. Neurologically Grossly intact, moving all 4 extremities equally 10. MSK: Normal range of motion  body mass index is unknown because there is no weight on file.   Labs on Admission:   Recent Labs  05/09/12 0149  NA 136  K 4.0  CL 99  CO2 32  GLUCOSE 90  BUN 17  CREATININE 1.14*  CALCIUM 8.5    Recent Labs  05/09/12 0149  AST 16  ALT 11   ALKPHOS 53  BILITOT 0.3  PROT 6.9  ALBUMIN 3.3*   No results found for this basename: LIPASE, AMYLASE,  in the last 72 hours  Recent Labs  05/09/12 0149  WBC 9.9  NEUTROABS 6.6  HGB 13.7  HCT 44.0  MCV 92.2  PLT 274   No results found for this basename: CKTOTAL, CKMB, CKMBINDEX, TROPONINI,  in the last 72 hours No results found for this basename: TSH, T4TOTAL, FREET3, T3FREE, THYROIDAB,  in the last 72 hours No results found for this basename: VITAMINB12, FOLATE, FERRITIN, TIBC, IRON, RETICCTPCT,  in the last 72 hours Lab Results  Component Value Date   HGBA1C  Value: 5.6 (NOTE)   The ADA recommends the following therapeutic goals for glycemic   control related to Hgb A1C measurement:   Goal of Therapy:   < 7.0% Hgb A1C   Action Suggested:  > 8.0% Hgb A1C   Ref:  Diabetes Care, 22, Suppl. 1, 1999 09/01/2007    The CrCl is unknown because both a height and weight (above a minimum accepted value) are required for this calculation. ABG    Component Value Date/Time   PHART 7.396 02/24/2008 0624   HCO3 31.7* 02/24/2008 0624   TCO2 37 03/23/2012 0025   O2SAT 93.0 02/24/2008 0624     Lab Results  Component Value Date   DDIMER 0.90* 08/24/2011      BNP 168  Cultures:    Component Value Date/Time   SDES URINE, CLEAN CATCH 03/23/2012 0236   SPECREQUEST NONE 03/23/2012 0236   CULT NO GROWTH 5 DAYS 03/17/2012 0140   REPTSTATUS 03/23/2012 FINAL 03/23/2012 0236       Radiological Exams on Admission: Dg Chest 2 View  05/09/2012  *RADIOLOGY REPORT*  Clinical Data: Shortness of breath and weakness; history of asthma.  CHEST - 2 VIEW  Comparison: Chest radiograph performed 03/22/2012  Findings: The lungs are mildly hypoexpanded.  Vascular crowding and vascular congestion are seen.  Mildly increased interstitial markings may reflect mild interstitial edema.  No definite pleural effusion or pneumothorax is seen.  The heart is mildly enlarged.  No acute osseous abnormalities are seen.   IMPRESSION: Lungs mildly hypoexpanded.  Vascular congestion and mild cardiomegaly, with mildly increased interstitial markings, raising concern for mild interstitial edema.   Original Report Authenticated By: Santa Lighter, M.D.     Chart has been reviewed  Assessment/Plan  This is a 47 year old female with a history of chronic asthma and morbid obesity presenting with 2 week history of worsening shortness of breath likely multifactorial  Present on Admission:  . SOB (shortness of breath) - shortness of breath likely multifactorial she may have some degree of asthma exacerbation as well as mild fluid overload. Echo gram from 2010 hand showed normal EF but evidence of diastolic dysfunction. We'll repeat echo gram cycle cardiac markers. Treat asthma. Hold off on antibiotics as currently she has no definitive evidence of infection. Patient had had dose of 40 IV Lasix in ER for now continue gentle diuresis and watch her fluid status.  . OBSTRUCTIVE SLEEP APNEA - continue CPAP  . Morbid obesity - stable we'll evaluate for presence of diabetes  . HYPERTENSION -continue home medications   . Asthma - possibly mild exacerbation currently I do not appreciate any wheezing hold off on antibiotics for now but give prednisone, albuterol nebulizers and start dulera  likely drug-induced rash - patient's rash consistent with a drug reaction. She have had recent exposure to Levaquin this could be part potential culprit. Dermatologist evaluation as an outpatient could be beneficial if this not spontaneously resolve.  Prophylaxis:   Lovenox, Protonix  CODE STATUS: FULL CODE  Other plan as per orders.  I have spent a total of  60 min on this admission  Joselyn Edling 05/09/2012, 4:21 AM

## 2012-05-10 DIAGNOSIS — M549 Dorsalgia, unspecified: Secondary | ICD-10-CM

## 2012-05-10 DIAGNOSIS — N179 Acute kidney failure, unspecified: Secondary | ICD-10-CM

## 2012-05-10 DIAGNOSIS — L0292 Furuncle, unspecified: Secondary | ICD-10-CM

## 2012-05-10 MED ORDER — MORPHINE SULFATE 2 MG/ML IJ SOLN: 1.0000 mg | INTRAMUSCULAR | Status: DC | PRN

## 2012-05-10 MED ORDER — HYDROMORPHONE HCL PF 1 MG/ML IJ SOLN
0.5000 mg | INTRAMUSCULAR | Status: DC | PRN
  Administered 2012-05-10 (×2): 0.5 mg via INTRAVENOUS
  Filled 2012-05-10 (×2): qty 1

## 2012-05-10 NOTE — Progress Notes (Signed)
Nutrition Brief Note  Patient identified on the Malnutrition Screening Tool (MST) Report.  Wt Readings from Last 10 Encounters:  05/09/12 393 lb (178.264 kg)  03/23/12 436 lb 1.1 oz (197.8 kg)  03/17/12 407 lb 13.6 oz (185 kg)  12/27/11 401 lb 3.8 oz (182 kg)  08/27/11 423 lb 8 oz (192.098 kg)  05/19/11 429 lb 10.8 oz (194.9 kg)  09/04/10 411 lb 14.4 oz (186.837 kg)  08/29/10 415 lb 6.4 oz (188.424 kg)  08/22/10 414 lb (187.789 kg)  08/02/10 412 lb 12.8 oz (187.245 kg)    Body mass index is 65.4 kg/(m^2). Patient meets criteria for Obesity Class III based on current BMI.   Current diet order is Regular, patient is consuming approximately 100% of meals at this time. Labs and medications reviewed.   No nutrition interventions warranted at this time. Please consult RD as needed.  Arthur Holms, RD, LDN Pager #: [phone] After-Hours Pager #: [phone]

## 2012-05-10 NOTE — Progress Notes (Signed)
Patient ID: Kara Vincent, female   DOB: 1965/06/09, 47 y.o.   MRN: [id number]  [facility] PROGRESS NOTE  Lakyla Guppy Fritzsche [id number] DOB: 1965-11-13 DOA: 05/09/2012 PCP: Harvie Junior, MD  Brief narrative: Pt is 47 yo female with asthma, morbidly obese, OSA on CPAP and oxygen at home, 3 L continuous flow who presented to [facility] ED with main concern of progressively worsening shortness of breath associated with difficulty with ambulation, non productive cough. [facility] asked to admit for ? CHF exacerbation.  Active Problems:  Acute respiratory failure, hypoxic - this is likely multifactorial in etiology and secondary to asthma exacerbation, CHF diastolic most likely, OSA/OHS - will continue supportive measures for now, nebulizers, oxygen, steroids - 2 D ECHO done and results pending - continue Lasix, monitor daily weights, I's and O's   HYPERTENSION - reasonable control in the hospital - continue to monitor vitals per floor protocol   OBSTRUCTIVE SLEEP APNEA - pt has been refusing CPAP   BACK PAIN - analgesia as neede   Morbid obesity - nutrition recommendations provided Acute on chronic renal failure - now on lasix so will have to monitor closely - BMP in AM  Consultants:  None  Procedures/Studies: Dg Chest 2 View 05/09/2012   Lungs mildly hypoexpanded.  Vascular congestion and mild cardiomegaly, with mildly increased interstitial markings, raising concern for mild interstitial edema.   Antibiotics:  None  Code Status: Full Family Communication: Pt at bedside Disposition Plan: Home when medically stable  HPI/Subjective: No events overnight.   Objective: Filed Vitals:   05/09/12 2200 05/10/12 0416 05/10/12 0530 05/10/12 1022  BP: 130/94  106/71 112/79  Pulse: 67 68 71   Temp: 97.7 F (36.5 C)  97.8 F (36.6 C)   TempSrc: Oral  Oral   Resp: 18 20 18    Height:      Weight:      SpO2: 96% 95% 95%     Intake/Output Summary (Last 24 hours) at 05/10/12  1055 Last data filed at 05/10/12 0800  Gross per 24 hour  Intake   1080 ml  Output      0 ml  Net   1080 ml    Exam:   General:  Pt is alert, follows commands appropriately, not in acute distress, morbidly obese  Cardiovascular: Regular rate and rhythm, distant heart sounds due to body habitus, no rubs, no gallops  Respiratory: Decreased breath sounds bilaterally with mild expiratory wheezing  Abdomen: Soft, non tender, non distended, bowel sounds present, no guarding  Extremities: Trace bilateral pitting lower extremity edema, pulses DP and PT palpable bilaterally  Neuro: Grossly nonfocal  Data Reviewed: Basic Metabolic Panel:  Recent Labs Lab 05/09/12 0149 05/09/12 0844 05/10/12 0018  NA 136 141  --   K 4.0 3.9  --   CL 99 101  --   CO2 32 32  --   GLUCOSE 90 92  --   BUN 17 17  --   CREATININE 1.14* 1.18*  --   CALCIUM 8.5 8.8  --   MG  --   --  2.1  PHOS  --   --  6.8*   Liver Function Tests:  Recent Labs Lab 05/09/12 0149 05/09/12 0844  AST 16 12  ALT 11 10  ALKPHOS 53 56  BILITOT 0.3 0.3  PROT 6.9 7.1  ALBUMIN 3.3* 3.6   CBC:  Recent Labs Lab 05/09/12 0149 05/09/12 0844  WBC 9.9 8.9  NEUTROABS 6.6  --   HGB  13.7 13.8  HCT 44.0 44.6  MCV 92.2 92.5  PLT 274 260   Cardiac Enzymes:  Recent Labs Lab 05/09/12 0844 05/09/12 1500 05/09/12 2012  TROPONINI <0.30 <0.30 <0.30   BNP: No components found with this basename: POCBNP,  CBG:  Recent Labs Lab 05/09/12 0740 05/09/12 1136 05/09/12 1703 05/09/12 2127 05/10/12 0749  GLUCAP 98 96 98 114* 107*    Recent Results (from the past 240 hour(s))  MRSA PCR SCREENING     Status: Abnormal   Collection Time    05/09/12 12:50 PM      Result Value Range Status   MRSA by PCR POSITIVE (*) NEGATIVE Final   Comment:            The GeneXpert MRSA Assay (FDA     approved for NASAL specimens     only), is one component of a     comprehensive MRSA colonization     surveillance program. It  is not     intended to diagnose MRSA     infection nor to guide or     monitor treatment for     MRSA infections.     RESULT CALLED TO, READ BACK BY AND VERIFIED WITH:     BUENDIA,R RN 05/09/12 1531 [place]     Scheduled Meds: . amitriptyline  75 mg Oral QHS  . aspirin  325 mg Oral Daily  . atorvastatin  20 mg Oral Daily  . cloNIDine  0.2 mg Oral BID  . darifenacin  7.5 mg Oral Daily  . docusate sodium  100 mg Oral BID  . enoxaparin (LOVENOX) injection  40 mg Subcutaneous Q24H  . furosemide  40 mg Oral Daily  . guaiFENesin  600 mg Oral BID  . ipratropium  0.5 mg Nebulization Q4H  . lubiprostone  24 mcg Oral QPM  . mometasone-formoterol  2 puff Inhalation BID  . pantoprazole  40 mg Oral QAC lunch  . predniSONE  40 mg Oral Q breakfast  . sodium chloride  3 mL Intravenous Q12H  . sodium chloride  3 mL Intravenous Q12H   Continuous Infusions:    Faye Ramsay, MD  [facility] Pager [phone]  If 7PM-7AM, please contact night-coverage [email or web address] Password [facility] 05/10/2012, 10:55 AM   LOS: 1 day

## 2012-05-11 MED ORDER — IPRATROPIUM BROMIDE 0.02 % IN SOLN: 0.5000 mg | Freq: Three times a day (TID) | RESPIRATORY_TRACT | Status: DC

## 2012-05-11 MED ORDER — MUPIROCIN 2 % EX OINT
1.0000 "application " | TOPICAL_OINTMENT | Freq: Two times a day (BID) | CUTANEOUS | Status: DC
  Administered 2012-05-11: 1 via NASAL
  Filled 2012-05-11: qty 22

## 2012-05-11 MED ORDER — CHLORHEXIDINE GLUCONATE CLOTH 2 % EX PADS
6.0000 | MEDICATED_PAD | Freq: Every day | CUTANEOUS | Status: DC
  Administered 2012-05-11: 6 via TOPICAL

## 2012-05-11 NOTE — Discharge Summary (Signed)
Physician Discharge Summary  Kara Vincent [id number] DOB: 1965/03/30 DOA: 05/09/2012  PCP: Harvie Junior, MD  Admit date: 05/09/2012 Discharge date: 05/11/2012  Recommendations for Outpatient Follow-up:  1. Pt will need to follow up with PCP in 2-3 weeks post discharge 2. Please obtain BMP to evaluate electrolytes and kidney function 3. Please also check CBC to evaluate Hg and Hct levels  Discharge Diagnoses: Acute respiratory failure secondary to CHF exacerbation, OSA,OHS, asthma  Active Problems:   HYPERTENSION   OBSTRUCTIVE SLEEP APNEA   BACK PAIN   Morbid obesity   Asthma   SOB (shortness of breath)    Discharge Condition: Stable  Diet recommendation: Heart healthy diet discussed in details   Brief narrative:  Pt is 47 yo female with asthma, morbidly obese, OSA on CPAP and oxygen at home, 3 L continuous flow who presented to [facility] ED with main concern of progressively worsening shortness of breath associated with difficulty with ambulation, non productive cough. TRH asked to admit for ? CHF exacerbation.   Active Problems:  Acute respiratory failure, hypoxic  - this is likely multifactorial in etiology and secondary to asthma exacerbation, OSA/OHS  - pt reports being still short of breath especially with exertion but this is most likely secondary to the above with progressive deconditioning and morbid obesity  - no significant diastolic or systolic dysfunction noted on 2 D ECHO 05/10/2012  - will continue supportive measures for now, nebulizers, oxygen, steroids  - continue Lasix, HYPERTENSION  - reasonable control in the hospital  - continue to monitor vitals per floor protocol  OBSTRUCTIVE SLEEP APNEA  - pt has been refusing CPAP  BACK PAIN  - analgesia as needed  Morbid obesity  - nutrition recommendations provided  Acute on chronic renal failure  - on lasix so will have to monitor closely   Consultants:  None Procedures/Studies:   Dg Chest 2 View  05/09/2012 --> Lungs mildly hypoexpanded. Vascular congestion and mild cardiomegaly, with mildly increased interstitial markings, raising concern for mild interstitial edema.  Antibiotics:  None  Code Status: Full  Family Communication: Pt at bedside    Discharge Exam: Filed Vitals:   05/11/12 1415  BP: 111/74  Pulse: 81  Temp: 98.7 F (37.1 C)  Resp: 20   Filed Vitals:   05/11/12 0532 05/11/12 0947 05/11/12 1204 05/11/12 1415  BP: 142/88 133/80  111/74  Pulse: 79   81  Temp: 98.1 F (36.7 C)   98.7 F (37.1 C)  TempSrc: Oral   Oral  Resp: 20   20  Height:      Weight:      SpO2: 95%  94% 93%    General: Pt is alert, follows commands appropriately, not in acute distress Cardiovascular: Regular rate and rhythm, S1/S2 +, no murmurs, no rubs, no gallops Respiratory: Clear to auscultation bilaterally, no wheezing, no crackles, no rhonchi Abdominal: Soft, non tender, non distended, bowel sounds +, no guarding Extremities: no edema, no cyanosis, pulses palpable bilaterally DP and PT Neuro: Grossly nonfocal  Discharge Instructions  Discharge Orders   Future Orders Complete By Expires     Diet - low sodium heart healthy  As directed     Increase activity slowly  As directed         Medication List    TAKE these medications       ALPRAZolam 1 MG tablet  Commonly known as:  XANAX  Take 1 tablet (1 mg total) by mouth at bedtime as needed.  amitriptyline 75 MG tablet  Commonly known as:  ELAVIL  Take 1 tablet (75 mg total) by mouth at bedtime.     ammonium lactate 12 % cream  Commonly known as:  AMLACTIN  Apply 1 g topically as needed. Applies to back and legs.Marland KitchenMarland KitchenIn a thin layer on affected area for dry patches.     aspirin 325 MG tablet  Take 325 mg by mouth daily.     atorvastatin 20 MG tablet  Commonly known as:  LIPITOR  Take 20 mg by mouth daily.     cloNIDine 0.2 MG tablet  Commonly known as:  CATAPRES  Take 0.2 mg by mouth 2 (two) times daily.      diphenhydrAMINE 25 mg capsule  Commonly known as:  BENADRYL  Take 25 mg by mouth every 6 (six) hours as needed. For itching     eucerin lotion  Apply topically 2 (two) times daily as needed. On affected areas for dry patches.     famotidine 20 MG tablet  Commonly known as:  PEPCID  Take 1 tablet (20 mg total) by mouth 2 (two) times daily.     furosemide 40 MG tablet  Commonly known as:  LASIX  Take 1 tablet (40 mg total) by mouth daily.     guaiFENesin-dextromethorphan 100-10 MG/5ML syrup  Commonly known as:  ROBITUSSIN DM  Take 5 mLs by mouth 3 (three) times daily as needed for cough.     HYDROcodone-acetaminophen 10-325 MG per tablet  Commonly known as:  NORCO  Take 1 tablet by mouth every 6 (six) hours as needed. For pain     ipratropium 0.02 % nebulizer solution  Commonly known as:  ATROVENT  Take 2.5 mLs (0.5 mg total) by nebulization every 4 (four) hours.     ketoconazole 2 % cream  Commonly known as:  NIZORAL  Apply topically daily.     ketoconazole 2 % shampoo  Commonly known as:  NIZORAL  Apply 1 application topically 2 (two) times a week.     ketoconazole 200 MG tablet  Commonly known as:  NIZORAL  Take 200 mg by mouth daily.     levofloxacin 750 MG tablet  Commonly known as:  LEVAQUIN  Take 1 tablet (750 mg total) by mouth daily.     lubiprostone 24 MCG capsule  Commonly known as:  AMITIZA  Take 24 mcg by mouth every evening.     omega-3 acid ethyl esters 1 G capsule  Commonly known as:  LOVAZA  Take 2 g by mouth 2 (two) times daily.     pantoprazole 40 MG tablet  Commonly known as:  PROTONIX  Take 40 mg by mouth daily.     promethazine 25 MG tablet  Commonly known as:  PHENERGAN  Take 25 mg by mouth every 6 (six) hours as needed. For nausea/vomiting     PROVENTIL HFA 108 (90 BASE) MCG/ACT inhaler  Generic drug:  albuterol  Inhale 2 puffs into the lungs every 4 (four) hours as needed. For asthma relief     albuterol (2.5 MG/3ML) 0.083%  nebulizer solution  Commonly known as:  PROVENTIL  Take 3 mLs (2.5 mg total) by nebulization every 6 (six) hours as needed for wheezing.     solifenacin 5 MG tablet  Commonly known as:  VESICARE  Take 5 mg by mouth 2 (two) times daily.     VITAMIN E PO  Take 1 tablet by mouth daily.  Follow-up Information   Follow up with Harvie Junior, MD In 2 weeks.   Contact information:   [address] [place] [address] [phone]        The results of significant diagnostics from this hospitalization (including imaging, microbiology, ancillary and laboratory) are listed below for reference.     Microbiology: Recent Results (from the past 240 hour(s))  MRSA PCR SCREENING     Status: Abnormal   Collection Time    05/09/12 12:50 PM      Result Value Range Status   MRSA by PCR POSITIVE (*) NEGATIVE Final   Comment:            The GeneXpert MRSA Assay (FDA     approved for NASAL specimens     only), is one component of a     comprehensive MRSA colonization     surveillance program. It is not     intended to diagnose MRSA     infection nor to guide or     monitor treatment for     MRSA infections.     RESULT CALLED TO, READ BACK BY AND VERIFIED WITH:     BUENDIA,R RN 05/09/12 [place]: Basic Metabolic Panel:  Recent Labs Lab 05/09/12 0149 05/09/12 0844 05/10/12 0018  NA 136 141  --   K 4.0 3.9  --   CL 99 101  --   CO2 32 32  --   GLUCOSE 90 92  --   BUN 17 17  --   CREATININE 1.14* 1.18*  --   CALCIUM 8.5 8.8  --   MG  --   --  2.1  PHOS  --   --  6.8*   Liver Function Tests:  Recent Labs Lab 05/09/12 0149 05/09/12 0844  AST 16 12  ALT 11 10  ALKPHOS 53 56  BILITOT 0.3 0.3  PROT 6.9 7.1  ALBUMIN 3.3* 3.6   No results found for this basename: LIPASE, AMYLASE,  in the last 168 hours No results found for this basename: AMMONIA,  in the last 168 hours CBC:  Recent Labs Lab 05/09/12 0149 05/09/12 0844  WBC 9.9 8.9   NEUTROABS 6.6  --   HGB 13.7 13.8  HCT 44.0 44.6  MCV 92.2 92.5  PLT 274 260   Cardiac Enzymes:  Recent Labs Lab 05/09/12 0844 05/09/12 1500 05/09/12 2012  TROPONINI <0.30 <0.30 <0.30   BNP: BNP (last 3 results)  Recent Labs  03/17/12 0135 03/23/12 0007 05/09/12 0150  PROBNP 514.3* 133.5* 168.6*   CBG:  Recent Labs Lab 05/09/12 1136 05/09/12 1703 05/09/12 2127 05/10/12 0749 05/10/12 1117  GLUCAP 96 98 114* 107* 120*     SIGNED: Time coordinating discharge: Over 30 minutes  Faye Ramsay, MD  [facility] 05/11/2012, 2:56 PM Pager [phone]  If 7PM-7AM, please contact night-coverage [email or web address] Password [email or web address]

## 2012-05-11 NOTE — Progress Notes (Signed)
D/c orders received; IV removed with gauze on, pt remains in stable condition, pt meds and instructions reviewed and given to pt; pt d/c to home via [facility], reminded pt to wear o2 as prescribed

## 2012-05-11 NOTE — Progress Notes (Signed)
RT placed patient on cpap auto 20cmH20 max and 8cmH20 min via full face mask with 3lpm 02 bleed in. Patient is tolerating cpap well at this time. RT will continue to monitor.

## 2012-05-11 NOTE — Progress Notes (Signed)
Patient ID: Kara Vincent, female   DOB: 1966-01-08, 47 y.o.   MRN: [id number]  [facility] PROGRESS NOTE  Kara Vincent [id number] DOB: [birth year] DOA: 05/09/2012 PCP: Harvie Junior, MD  Brief narrative:  Pt is 47 yo female with asthma, morbidly obese, OSA on CPAP and oxygen at home, 3 L continuous flow who presented to [facility] ED with main concern of progressively worsening shortness of breath associated with difficulty with ambulation, non productive cough. TRH asked to admit for ? CHF exacerbation.   Active Problems:  Acute respiratory failure, hypoxic  - this is likely multifactorial in etiology and secondary to asthma exacerbation,  OSA/OHS  - pt reports being still short of breath especially with exertion but this is most likely secondary to the above with progressive deconditioning and morbid obesity - no significant diastolic or systolic dysfunction noted on 2 D ECHO 05/10/2012 - will continue supportive measures for now, nebulizers, oxygen, steroids  - continue Lasix, monitor daily weights, I's and O's  - emphasized ambulation and PT while inpatient and plan on d/c home in AM HYPERTENSION  - reasonable control in the hospital  - continue to monitor vitals per floor protocol  OBSTRUCTIVE SLEEP APNEA  - pt has been refusing CPAP  BACK PAIN  - analgesia as needed Morbid obesity  - nutrition recommendations provided  Acute on chronic renal failure  - on lasix so will have to monitor closely  - BMP in AM   Consultants:  None Procedures/Studies:   Dg Chest 2 View 05/09/2012 --> Lungs mildly hypoexpanded. Vascular congestion and mild cardiomegaly, with mildly increased interstitial markings, raising concern for mild interstitial edema.  Antibiotics:  None  Code Status: Full  Family Communication: Pt at bedside  Disposition Plan: Home in AM   HPI/Subjective: No events overnight. Pt reports shortness of breath with exertion and was not able to get our of the  bed since admission.   Objective: Filed Vitals:   05/11/12 0037 05/11/12 0420 05/11/12 0532 05/11/12 0947  BP:   142/88 133/80  Pulse:   79   Temp:   98.1 F (36.7 C)   TempSrc:   Oral   Resp:   20   Height:      Weight:      SpO2: 95% 96% 95%     Intake/Output Summary (Last 24 hours) at 05/11/12 1137 Last data filed at 05/11/12 0900  Gross per 24 hour  Intake    840 ml  Output    200 ml  Net    640 ml    Exam:   General:  Pt is alert, follows commands appropriately, not in acute distress, morbidly obeses  Cardiovascular: Regular rate and rhythm, distant heart sounds due to body habitus, no murmurs appreciated   Respiratory: Decreased breath sounds bilaterally due to body habitus   Abdomen: Soft, non tender, non distended, bowel sounds present, no guarding  Extremities: Trace bilateral lower extremity pitting edema, pulses DP and PT palpable bilaterally  Neuro: Grossly nonfocal  Data Reviewed: Basic Metabolic Panel:  Recent Labs Lab 05/09/12 0149 05/09/12 0844 05/10/12 0018  NA 136 141  --   K 4.0 3.9  --   CL 99 101  --   CO2 32 32  --   GLUCOSE 90 92  --   BUN 17 17  --   CREATININE 1.14* 1.18*  --   CALCIUM 8.5 8.8  --   MG  --   --  2.1  PHOS  --   --  6.8*   Liver Function Tests:  Recent Labs Lab 05/09/12 0149 05/09/12 0844  AST 16 12  ALT 11 10  ALKPHOS 53 56  BILITOT 0.3 0.3  PROT 6.9 7.1  ALBUMIN 3.3* 3.6   CBC:  Recent Labs Lab 05/09/12 0149 05/09/12 0844  WBC 9.9 8.9  NEUTROABS 6.6  --   HGB 13.7 13.8  HCT 44.0 44.6  MCV 92.2 92.5  PLT 274 260   Cardiac Enzymes:  Recent Labs Lab 05/09/12 0844 05/09/12 1500 05/09/12 2012  TROPONINI <0.30 <0.30 <0.30   CBG:  Recent Labs Lab 05/09/12 1136 05/09/12 1703 05/09/12 2127 05/10/12 0749 05/10/12 1117  GLUCAP 96 98 114* 107* 120*    Recent Results (from the past 240 hour(s))  MRSA PCR SCREENING     Status: Abnormal   Collection Time    05/09/12 12:50 PM       Result Value Range Status   MRSA by PCR POSITIVE (*) NEGATIVE Final   Comment:            The GeneXpert MRSA Assay (FDA     approved for NASAL specimens     only), is one component of a     comprehensive MRSA colonization     surveillance program. It is not     intended to diagnose MRSA     infection nor to guide or     monitor treatment for     MRSA infections.     RESULT CALLED TO, READ BACK BY AND VERIFIED WITH:     BUENDIA,R RN 05/09/12 1531 [place]     Scheduled Meds: . amitriptyline  75 mg Oral QHS  . aspirin  325 mg Oral Daily  . atorvastatin  20 mg Oral Daily  . cloNIDine  0.2 mg Oral BID  . darifenacin  7.5 mg Oral Daily  . docusate sodium  100 mg Oral BID  . enoxaparin injection  40 mg Subcutaneous Q24H  . furosemide  40 mg Oral Daily  . guaiFENesin  600 mg Oral BID  . ipratropium  0.5 mg Nebulization Q4H  . lubiprostone  24 mcg Oral QPM  . mometasone-formoterol  2 puff Inhalation BID  . mupirocin ointment  1 application Nasal BID  . pantoprazole  40 mg Oral QAC lunch  . predniSONE  40 mg Oral Q breakfast   Continuous Infusions:    Faye Ramsay, MD  [facility] Pager [phone]  If 7PM-7AM, please contact night-coverage [email or web address] Password [facility] 05/11/2012, 11:37 AM   LOS: 2 days

## 2012-05-11 NOTE — Evaluation (Signed)
Physical Therapy Evaluation Patient Details Name: Kara Vincent MRN: [id number] DOB: [birth year] Today's Date: 05/11/2012 Time: [id number] PT Time Calculation (min): 25 min  PT Assessment / Plan / Recommendation Clinical Impression  Pt appears at current baseline level of mobility and gait with SPC.  Pt educated on importance of wearing O2 at all times as her spO2 dropped to 84% when attempting gait and mobility on room air.  Able to perform gait with 3L O2 with O2 sats 94%    PT Assessment  Patent does not need any further PT services                      Precautions / Restrictions Restrictions Weight Bearing Restrictions: No   Pertinent Vitals/Pain spO2 94% on 3L O2 with activity      Mobility  Bed Mobility Bed Mobility: Supine to Sit;Sit to Supine Supine to Sit: 7: Independent Sit to Supine: 7: Independent Transfers Transfers: Risk manager;Sit to Stand;Stand to Sit Sit to Stand: 6: Modified independent (Device/Increase time) Stand to Sit: 6: Modified independent (Device/Increase time) Stand Pivot Transfers: 6: Modified independent (Device/Increase time) Details for Transfer Assistance: with SPC, 3L O2 Ambulation/Gait Ambulation/Gait Assistance: 6: Modified independent (Device/Increase time) Ambulation Distance (Feet): 50 Feet Assistive device: Straight cane Ambulation/Gait Assistance Details: Pt uses SPC at home and was safe using SPC for gait in room Gait velocity: decreased      Visit Information  Last PT Received On: 05/11/12 Assistance Needed: +1    Subjective Data  Subjective: I wish I could get my breath Patient Stated Goal: breathe better   Prior Functioning  Home Living Lives With: Friend(s) Available Help at Discharge: Friend(s) Type of Home: Apartment Home Access: Elevator Home Layout: One level Prior Function Level of Independence: Independent with assistive device(s) Communication Communication: No difficulties    Cognition  Cognition Overall Cognitive Status: Appears within functional limits for tasks assessed/performed Orientation Level: Appears intact for tasks assessed Behavior During Session: [facility] for tasks performed    Extremity/Trunk Assessment Right Lower Extremity Assessment RLE ROM/Strength/Tone: Within functional levels Left Lower Extremity Assessment LLE ROM/Strength/Tone: Within functional levels Trunk Assessment Trunk Assessment: Other exceptions Trunk Exceptions: ROM limited by soft tissue   Balance Dynamic Standing Balance Dynamic Standing - Level of Assistance: 6: Modified independent (Device/Increase time)  End of Session PT - End of Session Equipment Utilized During Treatment: Oxygen Activity Tolerance: Patient limited by fatigue Patient left: in bed;with call bell/phone within reach Nurse Communication: Mobility status  GP     DONAWERTH,KAREN 05/11/2012, 12:10 PM

## 2012-05-11 NOTE — Care Management Note (Signed)
    Page 1 of 2   05/11/2012     3:41:05 PM   CARE MANAGEMENT NOTE 05/11/2012  Patient:  Kara Vincent, Kara Vincent   Account Number:  [id number]  Date Initiated:  05/11/2012  Documentation initiated by:  GRAVES-BIGELOW,Ansleigh Safer  Subjective/Objective Assessment:   Pt admitted with asthma, morbidly obese, OSA on CPAP and oxygen at home, 3 L continuous flow who presented to [facility] ED.     Action/Plan:   Plan for d/c today no PT needs recommended. Pt was previously active with [facility] for RN services and now are d/c services.   Anticipated DC Date:  05/11/2012   Anticipated DC Plan:  [place]  CM consult      [facility] Choice  HOME HEALTH   Choice offered to / List presented to:  C-1 Patient        [place] arranged  HH-1 RN  [place] agency  [facility]   Status of service:  Completed, signed off Medicare Important Message given?   (If response is "NO", the following Medicare IM given date fields will be blank) Date Medicare IM given:   Date Additional Medicare IM given:    Discharge Disposition:  [place]  Per UR Regulation:  Reviewed for med. necessity/level of care/duration of stay  If discussed at Long Length of Stay Meetings, dates discussed:    Comments:  CM did call [facility] to see  if they are able to assist with [facility] services. Awaiting call back from [place]. CM did call Medical transportation to get them to pick up pt and is unavailable to contact them at this time. [phone]. MD aware that plan is to d/c today and in process of writing d/c summary. CM did call [facility] and they will provide services  for [facility] RN.

## 2012-05-12 NOTE — Progress Notes (Signed)
Physical Therapy Evaluation - addendum Late note for g-codes   05-23-12 1210  PT G-Codes **NOT FOR INPATIENT CLASS**  Functional Assessment Tool Used clinical judgement  Functional Limitation Mobility: Walking and moving around  Mobility: Walking and Moving Around Current Status [id number]) CI  Mobility: Walking and Moving Around Goal Status [phone]) CI  Mobility: Walking and Moving Around Discharge Status [id number]) CI  PT General Charges  $$ ACUTE PT VISIT 1 Procedure  PT Evaluation  $Initial PT Evaluation Tier I 1 Procedure  05/12/2012 [place], PT [phone]

## 2012-06-29 ENCOUNTER — Emergency Department (HOSPITAL_COMMUNITY)
Admission: EM | Admit: 2012-06-29 | Discharge: 2012-06-29 | Disposition: A | Discharge status: Home / Self Care | Payer: Medicaid Other | Attending: Emergency Medicine | Admitting: Emergency Medicine

## 2012-06-29 ENCOUNTER — Emergency Department (HOSPITAL_COMMUNITY): Payer: Medicaid Other

## 2012-06-29 ENCOUNTER — Encounter (HOSPITAL_COMMUNITY): Payer: Self-pay | Admitting: *Deleted

## 2012-06-29 DIAGNOSIS — Z8739 Personal history of other diseases of the musculoskeletal system and connective tissue: Secondary | ICD-10-CM | POA: Insufficient documentation

## 2012-06-29 DIAGNOSIS — Z87798 Personal history of other (corrected) congenital malformations: Secondary | ICD-10-CM | POA: Insufficient documentation

## 2012-06-29 DIAGNOSIS — M79602 Pain in left arm: Secondary | ICD-10-CM

## 2012-06-29 DIAGNOSIS — Z5900 Homelessness unspecified: Secondary | ICD-10-CM | POA: Insufficient documentation

## 2012-06-29 DIAGNOSIS — M109 Gout, unspecified: Secondary | ICD-10-CM | POA: Insufficient documentation

## 2012-06-29 DIAGNOSIS — Z87891 Personal history of nicotine dependence: Secondary | ICD-10-CM | POA: Insufficient documentation

## 2012-06-29 DIAGNOSIS — M542 Cervicalgia: Secondary | ICD-10-CM | POA: Insufficient documentation

## 2012-06-29 DIAGNOSIS — E785 Hyperlipidemia, unspecified: Secondary | ICD-10-CM | POA: Insufficient documentation

## 2012-06-29 DIAGNOSIS — Z7982 Long term (current) use of aspirin: Secondary | ICD-10-CM | POA: Insufficient documentation

## 2012-06-29 DIAGNOSIS — Z79899 Other long term (current) drug therapy: Secondary | ICD-10-CM | POA: Insufficient documentation

## 2012-06-29 DIAGNOSIS — M79609 Pain in unspecified limb: Secondary | ICD-10-CM | POA: Insufficient documentation

## 2012-06-29 DIAGNOSIS — I10 Essential (primary) hypertension: Secondary | ICD-10-CM | POA: Insufficient documentation

## 2012-06-29 DIAGNOSIS — Z8614 Personal history of Methicillin resistant Staphylococcus aureus infection: Secondary | ICD-10-CM | POA: Insufficient documentation

## 2012-06-29 DIAGNOSIS — J45901 Unspecified asthma with (acute) exacerbation: Secondary | ICD-10-CM | POA: Insufficient documentation

## 2012-06-29 DIAGNOSIS — K219 Gastro-esophageal reflux disease without esophagitis: Secondary | ICD-10-CM | POA: Insufficient documentation

## 2012-06-29 DIAGNOSIS — R51 Headache: Secondary | ICD-10-CM | POA: Insufficient documentation

## 2012-06-29 LAB — CBC
HCT: 39.9 % (ref 36.0–46.0)
Hemoglobin: 12.5 g/dL (ref 12.0–15.0)
MCH: 28.5 pg (ref 26.0–34.0)
MCHC: 31.3 g/dL (ref 30.0–36.0)

## 2012-06-29 LAB — BASIC METABOLIC PANEL
BUN: 19 mg/dL (ref 6–23)
Calcium: 8.8 mg/dL (ref 8.4–10.5)
GFR calc non Af Amer: 54 mL/min — ABNORMAL LOW (ref >90)
Glucose, Bld: 102 mg/dL — ABNORMAL HIGH (ref 70–99)
Sodium: 141 mEq/L (ref 135–145)

## 2012-06-29 MED ORDER — ALBUTEROL SULFATE HFA 108 (90 BASE) MCG/ACT IN AERS
2.0000 | INHALATION_SPRAY | RESPIRATORY_TRACT | Status: DC | PRN
  Filled 2012-06-29: qty 6.7

## 2012-06-29 MED ORDER — PREDNISONE 20 MG PO TABS
60.0000 mg | ORAL_TABLET | Freq: Once | ORAL | Status: AC
  Administered 2012-06-29: 60 mg via ORAL
  Filled 2012-06-29: qty 3

## 2012-06-29 MED ORDER — PREDNISONE 20 MG PO TABS: 60.0000 mg | ORAL_TABLET | Freq: Every day | ORAL | Status: DC

## 2012-06-29 MED ORDER — ALBUTEROL SULFATE (5 MG/ML) 0.5% IN NEBU
5.0000 mg | INHALATION_SOLUTION | Freq: Once | RESPIRATORY_TRACT | Status: AC
  Administered 2012-06-29: 5 mg via RESPIRATORY_TRACT
  Filled 2012-06-29: qty 1

## 2012-06-29 MED ORDER — HYDROCODONE-ACETAMINOPHEN 5-325 MG PO TABS
1.0000 | ORAL_TABLET | Freq: Once | ORAL | Status: AC
  Administered 2012-06-29: 1 via ORAL
  Filled 2012-06-29: qty 1

## 2012-06-29 MED ORDER — ALBUTEROL SULFATE HFA 108 (90 BASE) MCG/ACT IN AERS: 1.0000 | INHALATION_SPRAY | Freq: Four times a day (QID) | RESPIRATORY_TRACT | Status: DC | PRN

## 2012-06-29 NOTE — ED Notes (Addendum)
Pt c/o HA, high BP, cough, SOB, and left arm pain/swelling x 3-4 days.   Denies n/v.

## 2012-06-29 NOTE — ED Notes (Signed)
Pt requested heat packs for her arm. After the first one fell off, pt requested that two heat packs be taped directly to her shoulder and elbow. The heat packs were each placed on a washcloth and then taped to the pt's arm, and pt said "That's fine."

## 2012-06-29 NOTE — ED Provider Notes (Signed)
History     CSN: [id number]  Arrival date & time 06/29/12  0005   First MD Initiated Contact with Patient 06/29/12 [phone]      Chief Complaint  Patient presents with  . Headache  . Arm Pain    (Consider location/radiation/quality/duration/timing/severity/associated sxs/prior treatment) HPI History provided by patient. Left arm pain the last 3-4 days with associated left neck pain. No recalled trauma. No fall. Patient uncertain if she may have slept on it wrong.  Pain is reproducible with movement of her arm. No chest pain or shortness of breath. She was evaluated by her primary care physician for this a few days ago and told that if he gets worse to come into the emergency department. She has asthma and tonight having some wheezing.  No productive sputum. No known sick contacts. Symptoms moderate in severity. Past Medical History  Diagnosis Date  . Asthma   . GERD (gastroesophageal reflux disease)   . Hypertension   . Homelessness   . Low back pain   . 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 Arthritis   . Gout   . Hyperlipemia   . Morbid obesity     uses motor wheel chair  . MRSA (methicillin resistant Staphylococcus aureus)     states about a year ago    Past Surgical History  Procedure Laterality Date  . Tubal ligation    . Cesarean section      Family History  Problem Relation Age of Onset  . Asthma Mother   . Diabetes Father   . Heart disease Father   . Stroke Father     History  Substance Use Topics  . Smoking status: Former Smoker    Types: Cigarettes    Quit date: 03/25/2003  . Smokeless tobacco: Former Systems developer    Quit date: 05/27/1978  . Alcohol Use: No    OB History   Grav Para Term Preterm Abortions TAB SAB Ect Mult Living                  Review of Systems  Constitutional: Negative for fever and chills.  HENT: Positive for neck pain. Negative for neck stiffness.   Eyes: Negative for pain.  Respiratory: Positive for wheezing.  Negative for shortness of breath.   Cardiovascular: Negative for chest pain.  Gastrointestinal: Negative for abdominal pain.  Genitourinary: Negative for dysuria.  Musculoskeletal: Negative for back pain.  Skin: Negative for rash.  Neurological: Negative for headaches.  All other systems reviewed and are negative.    Allergies  Ibuprofen; Adhesive; Azithromycin; Doxycycline; Sulfa antibiotics; and Sulfamethoxazole w-trimethoprim  Home Medications   Current Outpatient Rx  Name  Route  Sig  Dispense  Refill  . albuterol (PROVENTIL HFA) 108 (90 BASE) MCG/ACT inhaler   Inhalation   Inhale 2 puffs into the lungs every 4 (four) hours as needed. For asthma relief         . EXPIRED: albuterol (PROVENTIL) (2.5 MG/3ML) 0.083% nebulizer solution   Nebulization   Take 3 mLs (2.5 mg total) by nebulization every 6 (six) hours as needed for wheezing.   75 mL   12   . ALPRAZolam (XANAX) 1 MG tablet   Oral   Take 1 tablet (1 mg total) by mouth at bedtime as needed.   30 tablet   0   . amitriptyline (ELAVIL) 75 MG tablet   Oral   Take 1 tablet (75 mg total) by mouth at bedtime.  30 tablet   0   . ammonium lactate (AMLACTIN) 12 % cream   Topical   Apply 1 g topically as needed. Applies to back and legs.Marland KitchenMarland KitchenIn a thin layer on affected area for dry patches.         [name] aspirin 325 MG tablet   Oral   Take 325 mg by mouth daily.         [name] atorvastatin (LIPITOR) 20 MG tablet   Oral   Take 20 mg by mouth daily.         . cloNIDine (CATAPRES) 0.2 MG tablet   Oral   Take 0.2 mg by mouth 2 (two) times daily.         . diphenhydrAMINE (BENADRYL) 25 mg capsule   Oral   Take 25 mg by mouth every 6 (six) hours as needed. For itching         . Emollient (EUCERIN) lotion   Topical   Apply topically 2 (two) times daily as needed. On affected areas for dry patches.         . furosemide (LASIX) 40 MG tablet   Oral   Take 1 tablet (40 mg total) by mouth daily.   30 tablet       . guaiFENesin-dextromethorphan (ROBITUSSIN DM) 100-10 MG/5ML syrup   Oral   Take 5 mLs by mouth 3 (three) times daily as needed for cough.   240 mL   0   . HYDROcodone-acetaminophen (NORCO) 10-325 MG per tablet   Oral   Take 1 tablet by mouth every 6 (six) hours as needed. For pain   20 tablet   0   . ipratropium (ATROVENT) 0.02 % nebulizer solution   Nebulization   Take 2.5 mLs (0.5 mg total) by nebulization every 4 (four) hours.   75 mL   1   . ketoconazole (NIZORAL) 2 % cream   Topical   Apply topically daily.         [name] ketoconazole (NIZORAL) 2 % shampoo   Topical   Apply 1 application topically 2 (two) times a week.         [name] ketoconazole (NIZORAL) 200 MG tablet   Oral   Take 200 mg by mouth daily.         [name] levofloxacin (LEVAQUIN) 750 MG tablet   Oral   Take 1 tablet (750 mg total) by mouth daily.   7 tablet   0   . lubiprostone (AMITIZA) 24 MCG capsule   Oral   Take 24 mcg by mouth every evening.          [name] omega-3 acid ethyl esters (LOVAZA) 1 G capsule   Oral   Take 2 g by mouth 2 (two) times daily.         . pantoprazole (PROTONIX) 40 MG tablet   Oral   Take 40 mg by mouth daily.         . promethazine (PHENERGAN) 25 MG tablet   Oral   Take 25 mg by mouth every 6 (six) hours as needed. For nausea/vomiting         . solifenacin (VESICARE) 5 MG tablet   Oral   Take 5 mg by mouth 2 (two) times daily.         [name] VITAMIN E PO   Oral   Take 1 tablet by mouth daily.           BP 108/60  Pulse 69  Temp(Src) 98.3 F (36.8  C) (Oral)  Resp 25  SpO2 100%  LMP 03/24/2009  Physical Exam  Constitutional: She is oriented to person, place, and time. She appears well-developed and well-nourished.  HENT:  Head: Normocephalic and atraumatic.  Eyes: EOM are normal. Pupils are equal, round, and reactive to light.  Neck: Normal range of motion. Neck supple.  Left paracervical reproducible tenderness without midline tenderness or  deformity  Cardiovascular: Normal rate, regular rhythm and intact distal pulses.   Pulmonary/Chest: Effort normal. No respiratory distress.  Mild bilateral expiratory wheezes  Abdominal: Soft. Bowel sounds are normal. She exhibits no distension. There is no tenderness. There is no rebound.  obese  Musculoskeletal: Normal range of motion. She exhibits no edema.  Left upper extremity with full range of motion and mild tenderness in the left trapezial region. distal neurovascular intact x4  Neurological: She is alert and oriented to person, place, and time.  Skin: Skin is warm and dry.    ED Course  Procedures (including critical care time)  Results for orders placed during the hospital encounter of 06/29/12  CBC      Result Value Range   WBC 9.7  4.0 - 10.5 K/uL   RBC 4.39  3.87 - 5.11 MIL/uL   Hemoglobin 12.5  12.0 - 15.0 g/dL   HCT 39.9  36.0 - 46.0 %   MCV 90.9  78.0 - 100.0 fL   MCH 28.5  26.0 - 34.0 pg   MCHC 31.3  30.0 - 36.0 g/dL   RDW 15.1  11.5 - 15.5 %   Platelets 240  150 - 400 K/uL  BASIC METABOLIC PANEL      Result Value Range   Sodium 141  135 - 145 mEq/L   Potassium 4.6  3.5 - 5.1 mEq/L   Chloride 102  96 - 112 mEq/L   CO2 34 (*) 19 - 32 mEq/L   Glucose, Bld 102 (*) 70 - 99 mg/dL   BUN 19  6 - 23 mg/dL   Creatinine, Ser 1.17 (*) 0.50 - 1.10 mg/dL   Calcium 8.8  8.4 - 10.5 mg/dL   GFR calc non Af Amer 54 (*) >90 mL/min   GFR calc Af Amer 63 (*) >90 mL/min  POCT I-STAT TROPONIN I      Result Value Range   Troponin i, poc 0.00  0.00 - 0.08 ng/mL   Comment 3            Dg Chest 2 View  06/29/2012  *RADIOLOGY REPORT*  Clinical Data: Headache.  Arm pain.  Shortness of breath. Weakness.  CHEST - 2 VIEW  Comparison: Two-view chest 05/09/2012.  Findings: The heart is enlarged.  There is no significant edema or effusion to suggest failure. Aeration is improved.  No focal airspace disease is evident.  Lung volumes are low.  IMPRESSION:  1.  Low lung volumes. 2.  No acute  cardiopulmonary disease.   Original Report Authenticated By: San Morelle, M.D.      Date: 06/29/2012  Rate: 68  Rhythm: normal sinus rhythm  QRS Axis: normal  Intervals: normal  ST/T Wave abnormalities: nonspecific ST changes  Conduction Disutrbances:none  Narrative Interpretation:   Old EKG Reviewed: unchanged  Albuterol and prednisone provided  4:29 AM Patient up and down out of bed going to the bathroom no acute distress. Wheezes improved. Given hydrocodone for arm pain. Plan outpatient follow up with primary care physician. presentation does not suggest ACS and no indication for admit at this time.  MDM  Left arm and neck pain likely musculoskeletal Evaluated with EKG, imaging and labs reviewed as above. Borderline pulse ox initially improved with medications. Old records, vital signs and nursing notes reviewed.        Teressa Lower, MD 06/29/12 [phone]

## 2012-07-15 ENCOUNTER — Emergency Department (HOSPITAL_COMMUNITY): Payer: Medicaid Other

## 2012-07-15 ENCOUNTER — Encounter (HOSPITAL_COMMUNITY): Payer: Self-pay | Admitting: *Deleted

## 2012-07-15 DIAGNOSIS — J45901 Unspecified asthma with (acute) exacerbation: Secondary | ICD-10-CM | POA: Insufficient documentation

## 2012-07-15 DIAGNOSIS — E785 Hyperlipidemia, unspecified: Secondary | ICD-10-CM | POA: Insufficient documentation

## 2012-07-15 DIAGNOSIS — R05 Cough: Secondary | ICD-10-CM | POA: Insufficient documentation

## 2012-07-15 DIAGNOSIS — Z79899 Other long term (current) drug therapy: Secondary | ICD-10-CM | POA: Insufficient documentation

## 2012-07-15 DIAGNOSIS — I10 Essential (primary) hypertension: Secondary | ICD-10-CM | POA: Insufficient documentation

## 2012-07-15 DIAGNOSIS — M549 Dorsalgia, unspecified: Secondary | ICD-10-CM | POA: Insufficient documentation

## 2012-07-15 DIAGNOSIS — J45909 Unspecified asthma, uncomplicated: Secondary | ICD-10-CM | POA: Insufficient documentation

## 2012-07-15 DIAGNOSIS — Z87891 Personal history of nicotine dependence: Secondary | ICD-10-CM | POA: Insufficient documentation

## 2012-07-15 DIAGNOSIS — Z862 Personal history of diseases of the blood and blood-forming organs and certain disorders involving the immune mechanism: Secondary | ICD-10-CM | POA: Insufficient documentation

## 2012-07-15 DIAGNOSIS — Z8639 Personal history of other endocrine, nutritional and metabolic disease: Secondary | ICD-10-CM | POA: Insufficient documentation

## 2012-07-15 DIAGNOSIS — Z7982 Long term (current) use of aspirin: Secondary | ICD-10-CM | POA: Insufficient documentation

## 2012-07-15 DIAGNOSIS — Z8739 Personal history of other diseases of the musculoskeletal system and connective tissue: Secondary | ICD-10-CM | POA: Insufficient documentation

## 2012-07-15 DIAGNOSIS — R059 Cough, unspecified: Secondary | ICD-10-CM | POA: Insufficient documentation

## 2012-07-15 DIAGNOSIS — K219 Gastro-esophageal reflux disease without esophagitis: Secondary | ICD-10-CM | POA: Insufficient documentation

## 2012-07-15 DIAGNOSIS — Z8614 Personal history of Methicillin resistant Staphylococcus aureus infection: Secondary | ICD-10-CM | POA: Insufficient documentation

## 2012-07-15 LAB — COMPREHENSIVE METABOLIC PANEL
AST: 12 U/L (ref 0–37)
Albumin: 3.7 g/dL (ref 3.5–5.2)
Calcium: 9.2 mg/dL (ref 8.4–10.5)
Creatinine, Ser: 1.33 mg/dL — ABNORMAL HIGH (ref 0.50–1.10)
GFR calc non Af Amer: 46 mL/min — ABNORMAL LOW (ref >90)

## 2012-07-15 LAB — CBC WITH DIFFERENTIAL/PLATELET
Basophils Absolute: 0 10*3/uL (ref 0.0–0.1)
Basophils Relative: 0 % (ref 0–1)
Eosinophils Relative: 2 % (ref 0–5)
HCT: 43.1 % (ref 36.0–46.0)
MCHC: 31.8 g/dL (ref 30.0–36.0)
MCV: 90.7 fL (ref 78.0–100.0)
Monocytes Absolute: 0.5 10*3/uL (ref 0.1–1.0)
RDW: 15 % (ref 11.5–15.5)

## 2012-07-15 NOTE — ED Notes (Signed)
The pt  Has had some difficulty breathing with bi-lateral  Swollen lower extremities for 3-4 days with a headache

## 2012-07-16 ENCOUNTER — Emergency Department (HOSPITAL_COMMUNITY)
Admission: EM | Admit: 2012-07-16 | Discharge: 2012-07-16 | Disposition: A | Discharge status: Home / Self Care | Payer: Medicaid Other | Attending: Emergency Medicine | Admitting: Emergency Medicine

## 2012-07-16 DIAGNOSIS — R0602 Shortness of breath: Secondary | ICD-10-CM

## 2012-07-16 DIAGNOSIS — J45901 Unspecified asthma with (acute) exacerbation: Secondary | ICD-10-CM

## 2012-07-16 DIAGNOSIS — J45909 Unspecified asthma, uncomplicated: Secondary | ICD-10-CM

## 2012-07-16 LAB — URINALYSIS, ROUTINE W REFLEX MICROSCOPIC
Glucose, UA: NEGATIVE mg/dL
Leukocytes, UA: NEGATIVE
Nitrite: NEGATIVE
Protein, ur: NEGATIVE mg/dL
Urobilinogen, UA: 0.2 mg/dL (ref 0.0–1.0)

## 2012-07-16 LAB — POCT I-STAT TROPONIN I: Troponin i, poc: 0 ng/mL (ref 0.00–0.08)

## 2012-07-16 MED ORDER — ALBUTEROL SULFATE (5 MG/ML) 0.5% IN NEBU
5.0000 mg | INHALATION_SOLUTION | RESPIRATORY_TRACT | Status: DC
  Administered 2012-07-16: 5 mg via RESPIRATORY_TRACT
  Filled 2012-07-16: qty 1

## 2012-07-16 MED ORDER — ALBUTEROL SULFATE HFA 108 (90 BASE) MCG/ACT IN AERS
2.0000 | INHALATION_SPRAY | RESPIRATORY_TRACT | Status: DC | PRN
Start: 2012-07-16 — End: 2012-07-16
  Administered 2012-07-16: 2 via RESPIRATORY_TRACT
  Filled 2012-07-16: qty 6.7

## 2012-07-16 MED ORDER — PREDNISONE 20 MG PO TABS
50.0000 mg | ORAL_TABLET | Freq: Once | ORAL | Status: AC
  Administered 2012-07-16: 50 mg via ORAL
  Filled 2012-07-16: qty 3

## 2012-07-16 MED ORDER — IPRATROPIUM BROMIDE 0.02 % IN SOLN
0.5000 mg | RESPIRATORY_TRACT | Status: DC
  Administered 2012-07-16: 0.5 mg via RESPIRATORY_TRACT
  Filled 2012-07-16: qty 2.5

## 2012-07-16 MED ORDER — SODIUM CHLORIDE 0.9 % IV BOLUS (SEPSIS)
500.0000 mL | Freq: Once | INTRAVENOUS | Status: AC
  Administered 2012-07-16: 500 mL via INTRAVENOUS

## 2012-07-16 MED ORDER — PREDNISONE 50 MG PO TABS: 50.0000 mg | ORAL_TABLET | Freq: Every day | ORAL | Status: DC

## 2012-07-16 MED ORDER — HYDROCODONE-ACETAMINOPHEN 5-325 MG PO TABS: 1.0000 | ORAL_TABLET | Freq: Four times a day (QID) | ORAL | Status: DC | PRN

## 2012-07-16 MED ORDER — FUROSEMIDE 10 MG/ML IJ SOLN: 40.0000 mg | Freq: Once | INTRAMUSCULAR | Status: DC

## 2012-07-16 MED ORDER — FENTANYL CITRATE 0.05 MG/ML IJ SOLN
INTRAMUSCULAR | Status: AC
  Administered 2012-07-16: 50 ug via INTRAVENOUS
  Filled 2012-07-16: qty 2

## 2012-07-16 MED ORDER — HYDROMORPHONE HCL PF 1 MG/ML IJ SOLN
0.5000 mg | INTRAMUSCULAR | Status: AC
  Administered 2012-07-16: 0.5 mg via INTRAVENOUS
  Filled 2012-07-16: qty 1

## 2012-07-16 MED ORDER — FENTANYL CITRATE 0.05 MG/ML IJ SOLN: 50.0000 ug | Freq: Once | INTRAMUSCULAR | Status: AC

## 2012-07-16 MED ORDER — ASPIRIN 81 MG PO CHEW
324.0000 mg | CHEWABLE_TABLET | Freq: Once | ORAL | Status: AC
  Administered 2012-07-16: 324 mg via ORAL
  Filled 2012-07-16: qty 4

## 2012-07-16 NOTE — ED Notes (Signed)
Pt placed on 3 L [place] for Comfort. Pt uses 3L [place] at home.

## 2012-07-16 NOTE — ED Notes (Signed)
Pt states she has been having a headache and leg pain for 3-4 days. Pt states she is on Vicodin 5-325 mg which she takes up to 3 tabs. Pt states she is out of pain medication since last night. Pt states she called her Primary yesterday to let him know she was out of pain medication but states MD office was full. Pt states pain has become unbearable and she cannot walk. Pain 10/10 in bilateral legs and head. Pt states he bilateral hands and feet also have become numb. Pt states she is also SOB for the past 3 days. Pt states she uses 3L [place] O2 at home and a sleep apnea machine at night.

## 2012-07-16 NOTE — ED Notes (Signed)
Charge nurse at bedside to explain to patient why she is to be discharged. Pt states she understands all instructions. MD verified by giving patient prescriptions and an evaluation.

## 2012-07-16 NOTE — ED Provider Notes (Signed)
History     CSN: [id number]  Arrival date & time 07/15/12  2245   First MD Initiated Contact with Patient 07/16/12 [phone]      Chief Complaint  Patient presents with  . Shortness of Breath    HPI Kara Vincent is a 47 y.o. female history of chronic back pain, asthma, hypertension and morbid obesity using a motorized wheelchair presents with shortness of breath. She is complaining about headaches, and increased swelling to lower extremities. Patient says she's been taking her normal dose of Lasix without relief. She says she's been using an albuterol inhaler, but has not been helping. She's had a cough productive of some thick yellow stuff, she is a previous smoker and currently does not smoke. She said some intermittent sharp 7/10 chest pain in the center of the chest, she's also complaining about some tingling and numbness in the lower extremities. This is been an ongoing issue.  Denies any history of venous thromboembolic disease, no history of hemoptysis, no cancer treatments, patient is not taking exogenous estrogens, no recent trauma or long bone fractures or surgical procedures within the last 60 days.   Past Medical History  Diagnosis Date  . Asthma   . GERD (gastroesophageal reflux disease)   . Hypertension   . Homelessness   . Low back pain   . 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 Arthritis   . Gout   . Hyperlipemia   . Morbid obesity     uses motor wheel chair  . MRSA (methicillin resistant Staphylococcus aureus)     states about a year ago    Past Surgical History  Procedure Laterality Date  . Tubal ligation    . Cesarean section      Family History  Problem Relation Age of Onset  . Asthma Mother   . Diabetes Father   . Heart disease Father   . Stroke Father     History  Substance Use Topics  . Smoking status: Former Smoker    Types: Cigarettes    Quit date: 03/25/2003  . Smokeless tobacco: Former Systems developer    Quit date: 05/27/1978  .  Alcohol Use: No    OB History   Grav Para Term Preterm Abortions TAB SAB Ect Mult Living                  Review of Systems At least 10pt or greater review of systems completed and are negative except where specified in the HPI.  Allergies  Ibuprofen; Adhesive; Azithromycin; Doxycycline; Sulfa antibiotics; and Sulfamethoxazole w-trimethoprim  Home Medications   Current Outpatient Rx  Name  Route  Sig  Dispense  Refill  . albuterol (PROVENTIL HFA) 108 (90 BASE) MCG/ACT inhaler   Inhalation   Inhale 2 puffs into the lungs every 4 (four) hours as needed. For asthma relief         . albuterol (PROVENTIL) (2.5 MG/3ML) 0.083% nebulizer solution   Nebulization   Take 2.5 mg by nebulization every 6 (six) hours as needed for wheezing.         [name] allopurinol (ZYLOPRIM) 100 MG tablet   Oral   Take 100 mg by mouth daily.         [name] amitriptyline (ELAVIL) 75 MG tablet   Oral   Take 1 tablet (75 mg total) by mouth at bedtime.   30 tablet   0   . ammonium lactate (AMLACTIN) 12 % cream  Topical   Apply 1 g topically as needed. Applies to back and legs.Marland KitchenMarland KitchenIn a thin layer on affected area for dry patches.         [name] aspirin 325 MG tablet   Oral   Take 325 mg by mouth daily.         [name] atorvastatin (LIPITOR) 20 MG tablet   Oral   Take 20 mg by mouth daily.         . clonazePAM (KLONOPIN) 1 MG tablet   Oral   Take 1 mg by mouth 2 (two) times daily.         . cloNIDine (CATAPRES) 0.2 MG tablet   Oral   Take 0.2 mg by mouth 2 (two) times daily.         . diphenhydrAMINE (BENADRYL) 25 mg capsule   Oral   Take 25 mg by mouth every 6 (six) hours as needed. For itching         . Emollient (EUCERIN) lotion   Topical   Apply topically 2 (two) times daily as needed. On affected areas for dry patches.         . furosemide (LASIX) 40 MG tablet   Oral   Take 1 tablet (40 mg total) by mouth daily.   30 tablet      . guaiFENesin-dextromethorphan (ROBITUSSIN DM)  100-10 MG/5ML syrup   Oral   Take 5 mLs by mouth 3 (three) times daily as needed for cough.   240 mL   0   . HYDROcodone-acetaminophen (NORCO) 10-325 MG per tablet   Oral   Take 1 tablet by mouth every 6 (six) hours as needed for pain. For pain         . ipratropium (ATROVENT) 0.02 % nebulizer solution   Nebulization   Take 2.5 mLs (0.5 mg total) by nebulization every 4 (four) hours.   75 mL   1   . ketoconazole (NIZORAL) 2 % cream   Topical   Apply topically daily.         [name] ketoconazole (NIZORAL) 2 % shampoo   Topical   Apply 1 application topically 2 (two) times a week.         [name] ketoconazole (NIZORAL) 200 MG tablet   Oral   Take 200 mg by mouth daily.         [name] lisinopril (PRINIVIL,ZESTRIL) 20 MG tablet   Oral   Take 20 mg by mouth 2 (two) times daily.         [name] lubiprostone (AMITIZA) 24 MCG capsule   Oral   Take 24 mcg by mouth every evening.          . naproxen (NAPROSYN) 250 MG tablet   Oral   Take 250 mg by mouth 2 (two) times daily with a meal.         . omega-3 acid ethyl esters (LOVAZA) 1 G capsule   Oral   Take 2 g by mouth 2 (two) times daily.         . pantoprazole (PROTONIX) 40 MG tablet   Oral   Take 40 mg by mouth daily.         . promethazine (PHENERGAN) 25 MG tablet   Oral   Take 25 mg by mouth every 6 (six) hours as needed. For nausea/vomiting         . solifenacin (VESICARE) 5 MG tablet   Oral   Take 5 mg by mouth daily.          [name]  VITAMIN E PO   Oral   Take 1 tablet by mouth daily.           BP 119/63  Pulse 81  Resp 22  SpO2 99%  LMP 03/24/2009  Physical Exam  Nursing notes reviewed.  Electronic medical record reviewed. VITAL SIGNS:   Filed Vitals:   07/16/12 0530 07/16/12 0609 07/16/12 0715 07/16/12 0745  BP: 116/62 101/53 111/50 122/52  Pulse: 86 81 87 88  Resp: 18 18 16 17   SpO2: 96% 96% 99% 95%   CONSTITUTIONAL: Awake, oriented x4, appears non-toxic and in no apparent distress HENT:  Atraumatic, normocephalic, oral mucosa pink and moist, airway patent. Nares patent without drainage. External ears normal. EYES: Conjunctiva clear, EOMI, PERRLA NECK: Trachea midline, non-tender, supple CARDIOVASCULAR: Normal heart rate, Normal rhythm, No murmurs, rubs, gallops PULMONARY/CHEST: Good air movement, wheezing throughout mostly on expiration, no rales no rhonchi Symmetrical breath sounds. ABDOMINAL: Non-distended, morbidly obese, soft, non-tender - no rebound or guarding.  BS normal. NEUROLOGIC: Non-focal, moving all four extremities, no gross sensory or motor deficits. EXTREMITIES: No clubbing, cyanosis, or edema SKIN: Warm, Dry, No erythema. Rash on the abdomen consistent with Darier disease  ED Course  Procedures (including critical care time)  Date: 07/16/2012  Rate: 81  Rhythm: normal sinus rhythm  QRS Axis: normal  Intervals: normal  ST/T Wave abnormalities: normal  Conduction Disutrbances: none  Narrative Interpretation: PACs, no significant change when compared with prior EKG from 06/29/12   Labs Reviewed  COMPREHENSIVE METABOLIC PANEL - Abnormal; Notable for the following:    Glucose, Bld 106 (*)    BUN 35 (*)    Creatinine, Ser 1.33 (*)    GFR calc non Af Amer 46 (*)    GFR calc Af Amer 54 (*)    All other components within normal limits  CBC WITH DIFFERENTIAL  TROPONIN I  URINALYSIS, ROUTINE W REFLEX MICROSCOPIC   Dg Chest 2 View  07/16/2012  *RADIOLOGY REPORT*  Clinical Data: Shortness of breath  CHEST - 2 VIEW  Comparison: 06/29/2012  Findings: Hypoaeration.  Prominent cardiomediastinal contours, similar to prior. Interstitial prominence may be accentuated by patient body habitus/hypoaeration however edema not excluded.  No pleural effusion or pneumothorax.  No acute osseous finding.  IMPRESSION: Hypoaeration.  Prominent cardiomediastinal contours, similar to prior.  Mild edema not excluded.   Original Report Authenticated By: Carlos Levering, M.D.       1. Asthma exacerbation   2. Asthma   3. SOB (shortness of breath)       MDM  Patient to the ER with chief complaint of shortness of breath associated with some sharp intermittent chest pains.  No change on EKG, initial troponin is negative. Labs are unremarkable - creatinine seems to be about baseline of 1.33. No evidence of pneumonia or intrathoracic abnormality on chest x-ray. Patient is PERC negative.   Evidently, when the patient was first placed in a room an unknown physician entered her room and said that she would have a room and be admitted shortly. Was later determined that this position was from an unknown teaching service, and had mistaken this patient for another patient who was to be admitted. I explained this to the patient that she was not going to be admitted as there is no indication for admission at this time she had responded well to bronchodilators and could be discharged home. Patient was initially upset at this, and then requested more pain medicine.  Patient and want to be  admitted for peripheral edema. I explained to the patient that she can increase her Lasix dose, she just been taking the regular dose for diuresis and that does not require admission. She has no evidence of congestive heart failure, no rales on exam, patient is on baseline oxygen 3 L per minute, she is in no respiratory distress, she's not using accessory muscles, she appears perfectly comfortable.  I do not think her nonradiating intermittent sharp chest pains are related to ACS, she's had 2 separate troponins which are unremarkable, I think is likely related to either chest wall pain or her asthma.  Patient advised to return to the ER for worsening symptoms, followup primary care physician      Rhunette Croft, MD 07/16/12 0830

## 2012-07-16 NOTE — ED Notes (Signed)
Patient states she is still in pain and no one is helping her. She refuses to continue to talk to Dr. Mitzi Hansen. Pt states she wants to talk to the admitting MD that she talked to earlier in the shift. Pt states she wants to be admitted and feels like she needs to stay in the hospital. Dr. Mitzi Hansen has already seen the patient  And explained why he feels she needs to be discharged. Pt is refusing to accept being discharged.

## 2012-07-16 NOTE — ED Notes (Signed)
Pt given Rx for pain medication and albuterol inhaler. Pt stating she takes 10/325mg  pain meds and not 5/325mg . MD notified.

## 2012-07-28 ENCOUNTER — Encounter (HOSPITAL_COMMUNITY): Payer: Self-pay | Admitting: *Deleted

## 2012-07-28 ENCOUNTER — Inpatient Hospital Stay (HOSPITAL_COMMUNITY)
Admission: EM | Admit: 2012-07-28 | Discharge: 2012-08-01 | DRG: 202 | Disposition: A | Discharge status: Home Health | Payer: Medicaid Other | Attending: Internal Medicine | Admitting: Internal Medicine

## 2012-07-28 DIAGNOSIS — L738 Other specified follicular disorders: Secondary | ICD-10-CM | POA: Diagnosis present

## 2012-07-28 DIAGNOSIS — L739 Follicular disorder, unspecified: Secondary | ICD-10-CM

## 2012-07-28 DIAGNOSIS — Z6841 Body Mass Index (BMI) 40.0 and over, adult: Secondary | ICD-10-CM

## 2012-07-28 DIAGNOSIS — G4733 Obstructive sleep apnea (adult) (pediatric): Secondary | ICD-10-CM | POA: Diagnosis present

## 2012-07-28 DIAGNOSIS — D72829 Elevated white blood cell count, unspecified: Secondary | ICD-10-CM | POA: Diagnosis present

## 2012-07-28 DIAGNOSIS — K219 Gastro-esophageal reflux disease without esophagitis: Secondary | ICD-10-CM | POA: Diagnosis present

## 2012-07-28 DIAGNOSIS — I5031 Acute diastolic (congestive) heart failure: Secondary | ICD-10-CM | POA: Diagnosis present

## 2012-07-28 DIAGNOSIS — R0602 Shortness of breath: Secondary | ICD-10-CM

## 2012-07-28 DIAGNOSIS — N179 Acute kidney failure, unspecified: Secondary | ICD-10-CM | POA: Diagnosis present

## 2012-07-28 DIAGNOSIS — E875 Hyperkalemia: Secondary | ICD-10-CM | POA: Diagnosis present

## 2012-07-28 DIAGNOSIS — I10 Essential (primary) hypertension: Secondary | ICD-10-CM | POA: Diagnosis present

## 2012-07-28 DIAGNOSIS — J45909 Unspecified asthma, uncomplicated: Secondary | ICD-10-CM

## 2012-07-28 DIAGNOSIS — E662 Morbid (severe) obesity with alveolar hypoventilation: Secondary | ICD-10-CM | POA: Diagnosis present

## 2012-07-28 DIAGNOSIS — I509 Heart failure, unspecified: Secondary | ICD-10-CM | POA: Diagnosis present

## 2012-07-28 DIAGNOSIS — G8929 Other chronic pain: Secondary | ICD-10-CM

## 2012-07-28 DIAGNOSIS — J45902 Unspecified asthma with status asthmaticus: Principal | ICD-10-CM | POA: Diagnosis present

## 2012-07-28 NOTE — ED Notes (Signed)
Pt c/o swelling all over body x 3 wks; blood pressure going up and down; headache

## 2012-07-29 ENCOUNTER — Emergency Department (HOSPITAL_COMMUNITY): Payer: Medicaid Other

## 2012-07-29 ENCOUNTER — Encounter (HOSPITAL_COMMUNITY): Payer: Self-pay | Admitting: *Deleted

## 2012-07-29 DIAGNOSIS — J45902 Unspecified asthma with status asthmaticus: Principal | ICD-10-CM

## 2012-07-29 DIAGNOSIS — G8929 Other chronic pain: Secondary | ICD-10-CM

## 2012-07-29 DIAGNOSIS — L738 Other specified follicular disorders: Secondary | ICD-10-CM

## 2012-07-29 DIAGNOSIS — R0602 Shortness of breath: Secondary | ICD-10-CM

## 2012-07-29 DIAGNOSIS — D72829 Elevated white blood cell count, unspecified: Secondary | ICD-10-CM

## 2012-07-29 DIAGNOSIS — I5031 Acute diastolic (congestive) heart failure: Secondary | ICD-10-CM

## 2012-07-29 LAB — CBC WITH DIFFERENTIAL/PLATELET
Basophils Relative: 0 % (ref 0–1)
Eosinophils Absolute: 0.2 10*3/uL (ref 0.0–0.7)
Eosinophils Relative: 2 % (ref 0–5)
MCH: 28.5 pg (ref 26.0–34.0)
MCHC: 31.1 g/dL (ref 30.0–36.0)
Neutrophils Relative %: 76 % (ref 43–77)
Platelets: 250 10*3/uL (ref 150–400)
RBC: 4.42 MIL/uL (ref 3.87–5.11)
RDW: 14.7 % (ref 11.5–15.5)

## 2012-07-29 LAB — BASIC METABOLIC PANEL
BUN: 14 mg/dL (ref 6–23)
Calcium: 8.7 mg/dL (ref 8.4–10.5)
Creatinine, Ser: 1.18 mg/dL — ABNORMAL HIGH (ref 0.50–1.10)
GFR calc Af Amer: 62 mL/min — ABNORMAL LOW (ref >90)

## 2012-07-29 LAB — CBC
HCT: 42.4 % (ref 36.0–46.0)
MCH: 27.9 pg (ref 26.0–34.0)
MCV: 91.8 fL (ref 78.0–100.0)
Platelets: 243 10*3/uL (ref 150–400)
RDW: 14.7 % (ref 11.5–15.5)

## 2012-07-29 LAB — PRO B NATRIURETIC PEPTIDE: Pro B Natriuretic peptide (BNP): 492 pg/mL — ABNORMAL HIGH (ref 0–125)

## 2012-07-29 LAB — COMPREHENSIVE METABOLIC PANEL
ALT: 14 U/L (ref 0–35)
Albumin: 3.2 g/dL — ABNORMAL LOW (ref 3.5–5.2)
Alkaline Phosphatase: 65 U/L (ref 39–117)
Calcium: 8.5 mg/dL (ref 8.4–10.5)
Potassium: 3.6 mEq/L (ref 3.5–5.1)
Sodium: 138 mEq/L (ref 135–145)
Total Protein: 6.8 g/dL (ref 6.0–8.3)

## 2012-07-29 LAB — URINALYSIS, ROUTINE W REFLEX MICROSCOPIC
Glucose, UA: NEGATIVE mg/dL
Hgb urine dipstick: NEGATIVE
Ketones, ur: NEGATIVE mg/dL
Protein, ur: NEGATIVE mg/dL

## 2012-07-29 LAB — MRSA PCR SCREENING: MRSA by PCR: POSITIVE — AB

## 2012-07-29 MED ORDER — KETAMINE HCL 10 MG/ML IJ SOLN
18.0000 mg | Freq: Once | INTRAMUSCULAR | Status: AC
  Administered 2012-07-29: 18 mg via INTRAVENOUS

## 2012-07-29 MED ORDER — ACETAMINOPHEN 650 MG RE SUPP: 650.0000 mg | Freq: Four times a day (QID) | RECTAL | Status: DC | PRN

## 2012-07-29 MED ORDER — KETAMINE HCL 10 MG/ML IJ SOLN
18.0000 mg | Freq: Once | INTRAMUSCULAR | Status: AC
  Administered 2012-07-29: 18 mg via INTRAVENOUS
  Filled 2012-07-29: qty 1.8

## 2012-07-29 MED ORDER — HYDROCERIN EX CREA
TOPICAL_CREAM | Freq: Two times a day (BID) | CUTANEOUS | Status: DC | PRN
  Filled 2012-07-29: qty 113

## 2012-07-29 MED ORDER — NAPROXEN 500 MG PO TABS
500.0000 mg | ORAL_TABLET | Freq: Two times a day (BID) | ORAL | Status: DC
  Administered 2012-07-29 – 2012-08-01 (×7): 500 mg via ORAL
  Filled 2012-07-29 (×8): qty 1

## 2012-07-29 MED ORDER — DIPHENHYDRAMINE HCL 25 MG PO CAPS
25.0000 mg | ORAL_CAPSULE | Freq: Four times a day (QID) | ORAL | Status: DC | PRN
  Administered 2012-07-31 (×2): 25 mg via ORAL
  Filled 2012-07-29 (×2): qty 1

## 2012-07-29 MED ORDER — PROMETHAZINE HCL 25 MG PO TABS: 25.0000 mg | ORAL_TABLET | Freq: Four times a day (QID) | ORAL | Status: DC | PRN

## 2012-07-29 MED ORDER — FUROSEMIDE 10 MG/ML IJ SOLN
80.0000 mg | Freq: Once | INTRAMUSCULAR | Status: AC
Start: 2012-07-29 — End: 2012-07-29
  Administered 2012-07-29: 80 mg via INTRAVENOUS
  Filled 2012-07-29: qty 8

## 2012-07-29 MED ORDER — FLUTICASONE PROPIONATE 50 MCG/ACT NA SUSP
1.0000 | Freq: Every day | NASAL | Status: DC
  Administered 2012-07-29 – 2012-08-01 (×4): 1 via NASAL
  Filled 2012-07-29: qty 16

## 2012-07-29 MED ORDER — LISINOPRIL 20 MG PO TABS
20.0000 mg | ORAL_TABLET | Freq: Two times a day (BID) | ORAL | Status: DC
  Administered 2012-07-29 – 2012-07-30 (×4): 20 mg via ORAL
  Filled 2012-07-29 (×8): qty 1

## 2012-07-29 MED ORDER — SODIUM CHLORIDE 0.9 % IV SOLN
INTRAVENOUS | Status: DC | PRN
  Administered 2012-07-29: 18:00:00 via INTRAVENOUS

## 2012-07-29 MED ORDER — ALBUTEROL SULFATE (5 MG/ML) 0.5% IN NEBU: 2.5000 mg | INHALATION_SOLUTION | RESPIRATORY_TRACT | Status: DC | PRN

## 2012-07-29 MED ORDER — PREDNISONE 50 MG PO TABS
60.0000 mg | ORAL_TABLET | Freq: Once | ORAL | Status: DC
  Filled 2012-07-29: qty 3

## 2012-07-29 MED ORDER — ENOXAPARIN SODIUM 100 MG/ML ~~LOC~~ SOLN
90.0000 mg | SUBCUTANEOUS | Status: DC
  Administered 2012-07-29 – 2012-08-01 (×4): 90 mg via SUBCUTANEOUS
  Filled 2012-07-29 (×4): qty 1

## 2012-07-29 MED ORDER — EUCERIN EX LOTN: TOPICAL_LOTION | Freq: Two times a day (BID) | CUTANEOUS | Status: DC | PRN

## 2012-07-29 MED ORDER — HYDROCODONE-ACETAMINOPHEN 5-325 MG PO TABS: 1.0000 | ORAL_TABLET | ORAL | Status: DC | PRN

## 2012-07-29 MED ORDER — KETOCONAZOLE 2 % EX CREA
TOPICAL_CREAM | Freq: Every day | CUTANEOUS | Status: DC
  Administered 2012-07-29 – 2012-08-01 (×4): via TOPICAL
  Filled 2012-07-29: qty 15

## 2012-07-29 MED ORDER — ALBUTEROL SULFATE (5 MG/ML) 0.5% IN NEBU: 2.5000 mg | INHALATION_SOLUTION | RESPIRATORY_TRACT | Status: AC | PRN

## 2012-07-29 MED ORDER — KETOCONAZOLE 2 % EX SHAM
1.0000 "application " | MEDICATED_SHAMPOO | CUTANEOUS | Status: DC
  Filled 2012-07-29: qty 120

## 2012-07-29 MED ORDER — KETOCONAZOLE 200 MG PO TABS
200.0000 mg | ORAL_TABLET | Freq: Every day | ORAL | Status: DC
  Administered 2012-07-29: 200 mg via ORAL
  Filled 2012-07-29: qty 1

## 2012-07-29 MED ORDER — IPRATROPIUM BROMIDE 0.02 % IN SOLN
0.5000 mg | RESPIRATORY_TRACT | Status: DC
  Administered 2012-07-29 – 2012-07-30 (×9): 0.5 mg via RESPIRATORY_TRACT
  Filled 2012-07-29 (×9): qty 2.5

## 2012-07-29 MED ORDER — SALINE SPRAY 0.65 % NA SOLN
1.0000 | NASAL | Status: DC | PRN
  Filled 2012-07-29: qty 44

## 2012-07-29 MED ORDER — OXYMETAZOLINE HCL 0.05 % NA SOLN
1.0000 | Freq: Two times a day (BID) | NASAL | Status: AC | PRN
  Filled 2012-07-29: qty 15

## 2012-07-29 MED ORDER — CLONAZEPAM 1 MG PO TABS
1.0000 mg | ORAL_TABLET | Freq: Two times a day (BID) | ORAL | Status: DC
  Administered 2012-07-29 – 2012-08-01 (×7): 1 mg via ORAL
  Filled 2012-07-29 (×7): qty 1

## 2012-07-29 MED ORDER — ACETAMINOPHEN 325 MG PO TABS: 650.0000 mg | ORAL_TABLET | Freq: Four times a day (QID) | ORAL | Status: DC | PRN

## 2012-07-29 MED ORDER — AMITRIPTYLINE HCL 75 MG PO TABS
75.0000 mg | ORAL_TABLET | Freq: Every day | ORAL | Status: DC
  Administered 2012-07-29 – 2012-07-31 (×3): 75 mg via ORAL
  Filled 2012-07-29 (×4): qty 1

## 2012-07-29 MED ORDER — DARIFENACIN HYDROBROMIDE ER 7.5 MG PO TB24
7.5000 mg | ORAL_TABLET | Freq: Every day | ORAL | Status: DC
  Administered 2012-07-29 – 2012-08-01 (×4): 7.5 mg via ORAL
  Filled 2012-07-29 (×4): qty 1

## 2012-07-29 MED ORDER — SODIUM CHLORIDE 0.9 % IJ SOLN
3.0000 mL | Freq: Two times a day (BID) | INTRAMUSCULAR | Status: DC
  Administered 2012-07-29 – 2012-07-31 (×5): 3 mL via INTRAVENOUS

## 2012-07-29 MED ORDER — MAGNESIUM HYDROXIDE 400 MG/5ML PO SUSP: 30.0000 mL | Freq: Every day | ORAL | Status: DC | PRN

## 2012-07-29 MED ORDER — AMMONIUM LACTATE 12 % EX CREA: 1.0000 g | TOPICAL_CREAM | CUTANEOUS | Status: DC | PRN

## 2012-07-29 MED ORDER — LUBIPROSTONE 24 MCG PO CAPS
24.0000 ug | ORAL_CAPSULE | Freq: Every evening | ORAL | Status: DC
  Administered 2012-07-29 – 2012-08-01 (×4): 24 ug via ORAL
  Filled 2012-07-29 (×4): qty 1

## 2012-07-29 MED ORDER — HYDROCODONE-ACETAMINOPHEN 5-325 MG PO TABS
1.0000 | ORAL_TABLET | ORAL | Status: DC | PRN
  Administered 2012-07-29 – 2012-08-01 (×13): 2 via ORAL
  Filled 2012-07-29 (×13): qty 2

## 2012-07-29 MED ORDER — MUPIROCIN 2 % EX OINT
1.0000 "application " | TOPICAL_OINTMENT | Freq: Two times a day (BID) | CUTANEOUS | Status: DC
  Administered 2012-07-29 – 2012-08-01 (×6): 1 via NASAL
  Filled 2012-07-29: qty 22

## 2012-07-29 MED ORDER — LORAZEPAM 2 MG/ML IJ SOLN
1.0000 mg | Freq: Once | INTRAMUSCULAR | Status: AC
  Administered 2012-07-29: 1 mg via INTRAVENOUS
  Filled 2012-07-29: qty 1

## 2012-07-29 MED ORDER — AMMONIUM LACTATE 12 % EX LOTN
TOPICAL_LOTION | CUTANEOUS | Status: DC | PRN
  Filled 2012-07-29: qty 400

## 2012-07-29 MED ORDER — CLONIDINE HCL 0.2 MG PO TABS
0.2000 mg | ORAL_TABLET | Freq: Two times a day (BID) | ORAL | Status: DC
  Administered 2012-07-29 – 2012-07-30 (×4): 0.2 mg via ORAL
  Filled 2012-07-29 (×9): qty 1

## 2012-07-29 MED ORDER — CHLORHEXIDINE GLUCONATE CLOTH 2 % EX PADS
6.0000 | MEDICATED_PAD | Freq: Every day | CUTANEOUS | Status: DC
  Administered 2012-07-30 – 2012-08-01 (×3): 6 via TOPICAL

## 2012-07-29 MED ORDER — OMEGA-3-ACID ETHYL ESTERS 1 G PO CAPS
2.0000 g | ORAL_CAPSULE | Freq: Two times a day (BID) | ORAL | Status: DC
  Administered 2012-07-29 – 2012-08-01 (×7): 2 g via ORAL
  Filled 2012-07-29 (×8): qty 2

## 2012-07-29 MED ORDER — FUROSEMIDE 10 MG/ML IJ SOLN
80.0000 mg | Freq: Two times a day (BID) | INTRAMUSCULAR | Status: DC
  Administered 2012-07-29 – 2012-07-30 (×2): 80 mg via INTRAVENOUS
  Filled 2012-07-29 (×5): qty 8

## 2012-07-29 MED ORDER — ALBUTEROL SULFATE (5 MG/ML) 0.5% IN NEBU
5.0000 mg | INHALATION_SOLUTION | RESPIRATORY_TRACT | Status: DC
  Administered 2012-07-29 – 2012-07-30 (×9): 5 mg via RESPIRATORY_TRACT
  Filled 2012-07-29: qty 1
  Filled 2012-07-29: qty 0.5
  Filled 2012-07-29 (×2): qty 1
  Filled 2012-07-29 (×5): qty 0.5
  Filled 2012-07-29: qty 1
  Filled 2012-07-29: qty 0.5

## 2012-07-29 MED ORDER — PREDNISONE 50 MG PO TABS
60.0000 mg | ORAL_TABLET | Freq: Every day | ORAL | Status: DC
  Administered 2012-07-29 – 2012-07-31 (×3): 60 mg via ORAL
  Filled 2012-07-29 (×4): qty 1

## 2012-07-29 MED ORDER — PANTOPRAZOLE SODIUM 40 MG PO TBEC
40.0000 mg | DELAYED_RELEASE_TABLET | Freq: Every day | ORAL | Status: DC
  Administered 2012-07-29 – 2012-08-01 (×4): 40 mg via ORAL
  Filled 2012-07-29 (×4): qty 1

## 2012-07-29 MED ORDER — ASPIRIN 325 MG PO TABS
325.0000 mg | ORAL_TABLET | Freq: Every day | ORAL | Status: DC
  Administered 2012-07-29 – 2012-08-01 (×4): 325 mg via ORAL
  Filled 2012-07-29 (×4): qty 1

## 2012-07-29 MED ORDER — ATORVASTATIN CALCIUM 20 MG PO TABS
20.0000 mg | ORAL_TABLET | Freq: Every day | ORAL | Status: DC
  Administered 2012-07-29 – 2012-08-01 (×4): 20 mg via ORAL
  Filled 2012-07-29 (×4): qty 1

## 2012-07-29 MED ORDER — ALLOPURINOL 100 MG PO TABS
100.0000 mg | ORAL_TABLET | Freq: Every day | ORAL | Status: DC
  Administered 2012-07-29 – 2012-08-01 (×4): 100 mg via ORAL
  Filled 2012-07-29 (×4): qty 1

## 2012-07-29 NOTE — ED Notes (Signed)
Dr. Mitzi Hansen made aware of pt's pain.

## 2012-07-29 NOTE — H&P (Signed)
[facility] History and Physical  CHRISTON RINDLER [id number] DOB: October 29, 1965 DOA: 07/28/2012  Referring physician:  Rhunette Croft PCP:  Harvie Junior, MD   Chief Complaint:  Shortness of breath  HPI:  The patient is a 47 y.o. year-old female with history of asthma, OSA, OHS, HTN, HLD, morbid obesity, gout, chronic pain, homelessness who presents with increased SOB for the last 2-3 days.  She was recently admitted for respiratory failure due to CHF exacerbation, OSA, OHS, and asthma in mid-February and was discharged.  She has had 2 visits to the ER since that time, the last for SOB on 4/25 and discharged to home.  The patient was last at their baseline health 4 days ago when she developed productive cough of yellow sputum, wheeze, constant substernal chest tightness.  She has been using her inhaler every 4-6 hours and it has only been helping a little.  She has noticed increased lower extremity edema for the last 2-3 weeks and has been taking her lasix as prescribed but her swelling has increased.  + orthopnea, + PND more frequently than baseline.  She has been wearing her CPAP and oxygen regularly.  Felt nauseated without vomiting that improved with antiemetic.  + rhinorrhea, sinus congestion.  Denies fevers.     Review of Systems:  Denies fevers, chills, weight loss or gain, changes to hearing and vision.  + rhinorrhea, sinus congestion, no sore throat.  Denies palpitations.  Denies constipation, diarrhea, but having more frequent stools.  Denies dysuria, frequency, urgency, polyuria, polydipsia.  Denies hematemesis, blood in stools, melena, abnormal bruising or bleeding.  Denies lymphadenopathy.  Denies arthralgias, myalgias.  Lower extremity skin rash being treated by primary care doctor.  + lower extremity edema.  Denies focal numbness, weakness, slurred speech, confusion, facial droop.  Denies anxiety and depression.    Past Medical History  Diagnosis Date  . Asthma   . GERD  (gastroesophageal reflux disease)   . Hypertension   . Homelessness   . Low back pain   . 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 Arthritis   . Gout   . Hyperlipemia   . Morbid obesity     uses motor wheel chair  . MRSA (methicillin resistant Staphylococcus aureus)     states about a year ago   Past Surgical History  Procedure Laterality Date  . Tubal ligation    . Cesarean section     Social History:  reports that she quit smoking about 9 years ago. Her smoking use included Cigarettes. She smoked 0.00 packs per day. She quit smokeless tobacco use about 34 years ago. She reports that she does not drink alcohol or use illicit drugs.   Allergies  Allergen Reactions  . Ibuprofen Hives, Shortness Of Breath and Palpitations  . Adhesive (Tape) Rash  . Azithromycin Hives and Rash  . Doxycycline Rash  . Sulfa Antibiotics Rash  . Sulfamethoxazole W-Trimethoprim Rash    Family History  Problem Relation Age of Onset  . Asthma Mother   . Diabetes Father   . Heart disease Father   . Stroke Father      Prior to Admission medications   Medication Sig Start Date End Date Taking? Authorizing Provider  allopurinol (ZYLOPRIM) 100 MG tablet Take 100 mg by mouth daily.   Yes Historical Provider, MD  amitriptyline (ELAVIL) 75 MG tablet Take 1 tablet (75 mg total) by mouth at bedtime. 03/23/12  Yes Melton Alar, PA-C  aspirin 325 MG tablet Take 325 mg by mouth daily.   Yes Historical Provider, MD  atorvastatin (LIPITOR) 20 MG tablet Take 20 mg by mouth daily.   Yes Historical Provider, MD  clonazePAM (KLONOPIN) 1 MG tablet Take 1 mg by mouth 2 (two) times daily.   Yes Historical Provider, MD  cloNIDine (CATAPRES) 0.2 MG tablet Take 0.2 mg by mouth 2 (two) times daily.   Yes Historical Provider, MD  diphenhydrAMINE (BENADRYL) 25 mg capsule Take 25 mg by mouth every 6 (six) hours as needed. For itching   Yes Historical Provider, MD  Emollient (EUCERIN) lotion Apply topically 2  (two) times daily as needed. On affected areas for dry patches.   Yes Historical Provider, MD  furosemide (LASIX) 40 MG tablet Take 1 tablet (40 mg total) by mouth daily. 01/01/12  Yes Charlynne Cousins, MD  HYDROcodone-acetaminophen (NORCO/VICODIN) 5-325 MG per tablet Take 1-2 tablets by mouth every 6 (six) hours as needed for pain. 07/16/12  Yes John-Adam Bonk, MD  ipratropium (ATROVENT) 0.02 % nebulizer solution Take 2.5 mLs (0.5 mg total) by nebulization every 4 (four) hours. 03/23/12  Yes Marianne L York, PA-C  ketoconazole (NIZORAL) 2 % cream Apply topically daily.   Yes Historical Provider, MD  ketoconazole (NIZORAL) 2 % shampoo Apply 1 application topically 2 (two) times a week.   Yes Historical Provider, MD  ketoconazole (NIZORAL) 200 MG tablet Take 200 mg by mouth daily.   Yes Historical Provider, MD  lisinopril (PRINIVIL,ZESTRIL) 20 MG tablet Take 20 mg by mouth 2 (two) times daily.   Yes Historical Provider, MD  lubiprostone (AMITIZA) 24 MCG capsule Take 24 mcg by mouth every evening.    Yes Historical Provider, MD  naproxen (NAPROSYN) 250 MG tablet Take 250 mg by mouth 2 (two) times daily with a meal.   Yes Historical Provider, MD  omega-3 acid ethyl esters (LOVAZA) 1 G capsule Take 2 g by mouth 2 (two) times daily.   Yes Historical Provider, MD  pantoprazole (PROTONIX) 40 MG tablet Take 40 mg by mouth daily.   Yes Historical Provider, MD  predniSONE (DELTASONE) 50 MG tablet Take 1 tablet (50 mg total) by mouth daily. Starting tomorrow morning 07/16/12  Yes John-Adam Bonk, MD  promethazine (PHENERGAN) 25 MG tablet Take 25 mg by mouth every 6 (six) hours as needed. For nausea/vomiting   Yes Historical Provider, MD  solifenacin (VESICARE) 5 MG tablet Take 5 mg by mouth daily.    Yes Historical Provider, MD  VITAMIN E PO Take 1 tablet by mouth daily.   Yes Historical Provider, MD  albuterol (PROVENTIL HFA) 108 (90 BASE) MCG/ACT inhaler Inhale 2 puffs into the lungs every 4 (four) hours as  needed. For asthma relief    Historical Provider, MD  albuterol (PROVENTIL) (2.5 MG/3ML) 0.083% nebulizer solution Take 2.5 mg by nebulization every 6 (six) hours as needed for wheezing. 05/27/11 07/16/12  Tatyana A Kirichenko, PA-C  ammonium lactate (AMLACTIN) 12 % cream Apply 1 g topically as needed. Applies to back and legs.Marland KitchenMarland KitchenIn a thin layer on affected area for dry patches.    Historical Provider, MD   Physical Exam: Filed Vitals:   07/28/12 2255 07/29/12 0232 07/29/12 0303 07/29/12 0639  BP: 134/85 134/65  121/58  Pulse: 81 73  71  Temp: 97.7 F (36.5 C)   98.5 F (36.9 C)  TempSrc: Oral   Oral  Resp: 20 20  18   SpO2: 98% 95% 93% 94%     General:  Obese CF, no acute distress, lying on stretcher  Eyes:  PERRL, anicteric, non-injected.  ENT:  Nares clear.  OP clear, non-erythematous without plaques or exudates.  MMM.  Neck:  Supple without TM or JVD.    Lymph:  No cervical, supraclavicular, or submandibular LAD.  Cardiovascular:  RRR, normal S1, S2, without m/r/g.  2+ pulses, warm extremities  Respiratory:  High pitched full expiratory wheeze bilaterally with diminished BS at the bases.  No rales or rhonchi.  No increased WOB.  Abdomen:  NABS.  Soft, ND/NT.    Skin:  Diffuse mildly erythematous maculopapular rash in intrigenous areas and over lower extremities with some excoriations, many areas with central follicle.    Musculoskeletal:  Normal bulk and tone.  1+ LE edema.  Psychiatric:  A & O x 4.  Appropriate affect.  Neurologic:  CN 3-12 intact.  5/5 strength.  Sensation intact.  Labs on Admission:  Basic Metabolic Panel:  Recent Labs Lab 07/28/12 2340  NA 138  K 3.6  CL 98  CO2 31  GLUCOSE 124*  BUN 15  CREATININE 1.13*  CALCIUM 8.5   Liver Function Tests:  Recent Labs Lab 07/28/12 2340  AST 13  ALT 14  ALKPHOS 65  BILITOT 0.3  PROT 6.8  ALBUMIN 3.2*   No results found for this basename: LIPASE, AMYLASE,  in the last 168 hours No results found  for this basename: AMMONIA,  in the last 168 hours CBC:  Recent Labs Lab 07/28/12 2340  WBC 12.2*  NEUTROABS 9.2*  HGB 12.6  HCT 40.5  MCV 91.6  PLT 250   Cardiac Enzymes: No results found for this basename: CKTOTAL, CKMB, CKMBINDEX, TROPONINI,  in the last 168 hours  BNP (last 3 results)  Recent Labs  03/23/12 0007 05/09/12 0150 07/29/12 0210  PROBNP 133.5* 168.6* 492.0*   CBG: No results found for this basename: GLUCAP,  in the last 168 hours  Radiological Exams on Admission: Dg Chest 2 View  07/29/2012  *RADIOLOGY REPORT*  Clinical Data: 47 year old female with shortness of breath and lower extremity swelling.  CHEST - 2 VIEW  Comparison: 07/15/2012 and earlier.  Findings: Upright AP and lateral views of the chest.  Larger lung volumes and less respiratory motion. Stable cardiomegaly and mediastinal contours.  No pleural effusion, pneumothorax or consolidation.  Pulmonary vascularity appears stable or mildly decreased.  There is trace fluid in the right minor fissure. Visualized tracheal air column is within normal limits.  No acute osseous abnormality identified.  IMPRESSION: Pulmonary vascular congestion with trace pleural fluid in the right minor fissure compatible with interstitial edema.  Stable cardiomegaly and no other acute cardiopulmonary abnormality.   Original Report Authenticated By: Roselyn Reef, M.D.     EKG: NSR  Assessment/Plan Principal Problem:   Status asthmaticus Active Problems:   OBSTRUCTIVE SLEEP APNEA   HYPERTENSION   GERD   Morbid obesity   Folliculitis   Acute diastolic heart failure   Leukocytosis   Principal Problem:   Status asthmaticus, may have been triggered by allergies -  Start prednisone daily -  Scheduled duonebs with prn albuterol -  Nasal saline -  Flonase -  Afrin prn  Acute diastolic heart failure with mildly elevated BNP (may be falsely low due to BMI) and effusion on CXR -  Continue diuresis with IV lasix -  Daily  weights -  Strict i/o -  2gm sodium diet     OBSTRUCTIVE SLEEP APNEA, stable, continue CPAP and  O2   HYPERTENSION, blood pressure stable.  Continue home medicatiosn   GERD, stable, continue PPI   Morbid obesity, stable, encourage weight loss   Chronic pain, continue home pain medications - patient exhibits some narcotic seeking behaviors.   -  Please minimize IV narcotics as this is NOT the reason this patient is being observed.     Folliculitis, improving.  Continue ketoconazole topical treatments and further management per PCP   Leukocytosis: -  CXR neg -  Urinalysis -  Repeat CBC in AM  Diet:  2gm sodium Access:  PIV IVF:  off Proph:  lovenox  Code Status: full code Family Communication: spoke with patient alone Disposition Plan: pending improvement in dyspnea  Time spent: 60 min   Damali Broadfoot, [facility] Pager [phone]  If 7PM-7AM, please contact night-coverage [email or web address] Password [facility] 07/29/2012, 7:55 AM

## 2012-07-29 NOTE — Progress Notes (Signed)
Visited patient today at the request of nursing staff. She expresses concerns that she has tried to get personal care assistance help at home , but has been denied. She expresses a need to speak with a Education officer, museum about this. She said she had just come into the hospital last evening.  She speaks about difficulty with shortness of breath and some difficulty breathing.  I expressed my hopes for her continuing improvement and for peace and calm in the days to come. [name]

## 2012-07-29 NOTE — ED Provider Notes (Signed)
History     CSN: [id number]  Arrival date & time 07/28/12  2246   First MD Initiated Contact with Patient 07/29/12 0149     Chief Complaint  Patient presents with  . Leg Swelling   HPI Kara Vincent is a 47 y.o. female with a history of hypertension, morbid obesity, hyperlipidemia, asthma presents with shortness of breath. I saw this patient a week ago in the emergency department at [facility] for the similar complaint. Patient says she think she needs to be admitted. Shortness of breath has been worsening, constant, worse on any kind of exertion, she does not want, she uses a motorized scooter.  Patient uses oxygen 3 L all the time and CPAP at night. Patient says she feels like she has fluid in her legs and in her "body" and needs help getting red of the fluid. She currently takes 40 mg of Lasix twice a day.  She says the fluid although has been worsening also she was seen in ER. She denies any chest pain but she says she's had worsening allergic symptoms. She's had a cough but nonproductive, no chest pain, headaches, fevers or chills. No abdominal pain, nausea/vomiting, or diarrhea.  Also c/o pain to the hands and feet which she associates with "being swollen" and "fluid" described as severe, pins and needles.   Past Medical History  Diagnosis Date  . Asthma   . GERD (gastroesophageal reflux disease)   . Hypertension   . Homelessness   . Low back pain   . 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 Arthritis   . Gout   . Hyperlipemia   . Morbid obesity     uses motor wheel chair  . MRSA (methicillin resistant Staphylococcus aureus)     states about a year ago    Past Surgical History  Procedure Laterality Date  . Tubal ligation    . Cesarean section      Family History  Problem Relation Age of Onset  . Asthma Mother   . Diabetes Father   . Heart disease Father   . Stroke Father     History  Substance Use Topics  . Smoking status: Former Smoker    Types:  Cigarettes    Quit date: 03/25/2003  . Smokeless tobacco: Former Systems developer    Quit date: 05/27/1978  . Alcohol Use: No    OB History   Grav Para Term Preterm Abortions TAB SAB Ect Mult Living                  Review of Systems At least 10pt or greater review of systems completed and are negative except where specified in the HPI.  Allergies  Ibuprofen; Adhesive; Azithromycin; Doxycycline; Sulfa antibiotics; and Sulfamethoxazole w-trimethoprim  Home Medications   Current Outpatient Rx  Name  Route  Sig  Dispense  Refill  . allopurinol (ZYLOPRIM) 100 MG tablet   Oral   Take 100 mg by mouth daily.         [name] amitriptyline (ELAVIL) 75 MG tablet   Oral   Take 1 tablet (75 mg total) by mouth at bedtime.   30 tablet   0   . aspirin 325 MG tablet   Oral   Take 325 mg by mouth daily.         [name] atorvastatin (LIPITOR) 20 MG tablet   Oral   Take 20 mg by mouth daily.         [name]  clonazePAM (KLONOPIN) 1 MG tablet   Oral   Take 1 mg by mouth 2 (two) times daily.         . cloNIDine (CATAPRES) 0.2 MG tablet   Oral   Take 0.2 mg by mouth 2 (two) times daily.         . diphenhydrAMINE (BENADRYL) 25 mg capsule   Oral   Take 25 mg by mouth every 6 (six) hours as needed. For itching         . Emollient (EUCERIN) lotion   Topical   Apply topically 2 (two) times daily as needed. On affected areas for dry patches.         . furosemide (LASIX) 40 MG tablet   Oral   Take 1 tablet (40 mg total) by mouth daily.   30 tablet      . HYDROcodone-acetaminophen (NORCO/VICODIN) 5-325 MG per tablet   Oral   Take 1-2 tablets by mouth every 6 (six) hours as needed for pain.   23 tablet   0   . ipratropium (ATROVENT) 0.02 % nebulizer solution   Nebulization   Take 2.5 mLs (0.5 mg total) by nebulization every 4 (four) hours.   75 mL   1   . ketoconazole (NIZORAL) 2 % cream   Topical   Apply topically daily.         [name] ketoconazole (NIZORAL) 2 % shampoo   Topical    Apply 1 application topically 2 (two) times a week.         [name] ketoconazole (NIZORAL) 200 MG tablet   Oral   Take 200 mg by mouth daily.         [name] lisinopril (PRINIVIL,ZESTRIL) 20 MG tablet   Oral   Take 20 mg by mouth 2 (two) times daily.         [name] lubiprostone (AMITIZA) 24 MCG capsule   Oral   Take 24 mcg by mouth every evening.          . naproxen (NAPROSYN) 250 MG tablet   Oral   Take 250 mg by mouth 2 (two) times daily with a meal.         . omega-3 acid ethyl esters (LOVAZA) 1 G capsule   Oral   Take 2 g by mouth 2 (two) times daily.         . pantoprazole (PROTONIX) 40 MG tablet   Oral   Take 40 mg by mouth daily.         . predniSONE (DELTASONE) 50 MG tablet   Oral   Take 1 tablet (50 mg total) by mouth daily. Starting tomorrow morning   4 tablet   0   . promethazine (PHENERGAN) 25 MG tablet   Oral   Take 25 mg by mouth every 6 (six) hours as needed. For nausea/vomiting         . solifenacin (VESICARE) 5 MG tablet   Oral   Take 5 mg by mouth daily.          [name] VITAMIN E PO   Oral   Take 1 tablet by mouth daily.         [name] albuterol (PROVENTIL HFA) 108 (90 BASE) MCG/ACT inhaler   Inhalation   Inhale 2 puffs into the lungs every 4 (four) hours as needed. For asthma relief         . EXPIRED: albuterol (PROVENTIL) (2.5 MG/3ML) 0.083% nebulizer solution   Nebulization   Take 2.5 mg by  nebulization every 6 (six) hours as needed for wheezing.         [name] ammonium lactate (AMLACTIN) 12 % cream   Topical   Apply 1 g topically as needed. Applies to back and legs.Marland KitchenMarland KitchenIn a thin layer on affected area for dry patches.           BP 121/58  Pulse 71  Temp(Src) 98.5 F (36.9 C) (Oral)  Resp 18  SpO2 94%  LMP 03/24/2009  Physical Exam  Nursing notes reviewed.  Electronic medical record reviewed. VITAL SIGNS:   Filed Vitals:   07/28/12 2255 07/29/12 0232 07/29/12 0303 07/29/12 0639  BP: 134/85 134/65  121/58  Pulse: 81 73  71  Temp:  97.7 F (36.5 C)   98.5 F (36.9 C)  TempSrc: Oral   Oral  Resp: 20 20  18   SpO2: 98% 95% 93% 94%   CONSTITUTIONAL: Awake, oriented, appears non-toxic, morbidly obese HENT: Atraumatic, normocephalic, oral mucosa pink and moist, airway patent. Nares patent without drainage. External ears normal. EYES: Conjunctiva clear, EOMI, PERRLA NECK: Trachea midline, non-tender, supple CARDIOVASCULAR: Normal heart rate, Normal rhythm, No murmurs, rubs, gallops PULMONARY/CHEST: Limited by body habitus, wheezing throughout, fair air movement. Symmetrical breath sounds. Non-tender. ABDOMINAL: Non-distended, soft, non-tender - no rebound or guarding.  BS normal. NEUROLOGIC: Non-focal, moving all four extremities, no gross sensory or motor deficits. EXTREMITIES: No clubbing, cyanosis. Difficult to distinguish nonpitting edema versus obesity - patient asserts she is swollen SKIN: Warm, Dry, No erythema  ED Course  Procedures (including critical care time)  Date: 07/29/2012  Rate: 70  Rhythm: normal sinus rhythm  QRS Axis: normal  Intervals: normal  ST/T Wave abnormalities: normal  Conduction Disutrbances: none  Narrative Interpretation: unremarkable - nonischemic EKG, no change when compared with prior EKG from 07/16/22   Labs Reviewed  CBC WITH DIFFERENTIAL - Abnormal; Notable for the following:    WBC 12.2 (*)    Neutro Abs 9.2 (*)    All other components within normal limits  COMPREHENSIVE METABOLIC PANEL - Abnormal; Notable for the following:    Glucose, Bld 124 (*)    Creatinine, Ser 1.13 (*)    Albumin 3.2 (*)    GFR calc non Af Amer 57 (*)    GFR calc Af Amer 66 (*)    All other components within normal limits  PRO B NATRIURETIC PEPTIDE - Abnormal; Notable for the following:    Pro B Natriuretic peptide (BNP) 492.0 (*)    All other components within normal limits   Dg Chest 2 View  07/29/2012  *RADIOLOGY REPORT*  Clinical Data: 47 year old female with shortness of breath and lower  extremity swelling.  CHEST - 2 VIEW  Comparison: 07/15/2012 and earlier.  Findings: Upright AP and lateral views of the chest.  Larger lung volumes and less respiratory motion. Stable cardiomegaly and mediastinal contours.  No pleural effusion, pneumothorax or consolidation.  Pulmonary vascularity appears stable or mildly decreased.  There is trace fluid in the right minor fissure. Visualized tracheal air column is within normal limits.  No acute osseous abnormality identified.  IMPRESSION: Pulmonary vascular congestion with trace pleural fluid in the right minor fissure compatible with interstitial edema.  Stable cardiomegaly and no other acute cardiopulmonary abnormality.   Original Report Authenticated By: Roselyn Reef, M.D.      1. SOB (shortness of breath)   2. Asthma   3. Chronic pain   4. Morbid obesity   5. OBSTRUCTIVE SLEEP APNEA  MDM  Markeeta Holtzer Riggenbach is a 47 y.o. female history of dyspnea, chronic asthma, chronic pain, morbid obesity and obstructive sleep apnea presents with worsening shortness of breath. So this lady a week ago in the ER, diuresis or and she was discharged.  She says that despite taking her medicine as prescribed she still has a lot of fluid, and feels short of breath which is worse at night. She's also complaining about pain in her hands and feet which is described as pins and needles suggesting neuropathic pain in a stocking glove distribution, however she has no history of diabetes.  I think this patient's problems come from the fact that she was almost 400 pounds, and she has chronic asthma. I am not certain how compliant she is with her medications. Her cardiogram from February shows a normal ejection fraction with some mild LVH which could not fully be appreciated secondary to difficult [place] windows.  Patient's BNP is slightly elevated today at almost 400, chest x-rays consistent with volume overload and some interstitial edema.  Given her some Lasix however  after some diuresis she says she still not feeling well enough for discharge. Cells complaining about asthmatic exacerbation.  The patient is laying down she is saturating about 94% on oxygen, is difficult to ascertain her hypoxia says she does not ambulate she uses a scooter.  Dr. Sheran Fava of [facility] kindly agreed to evaluate and admit pt for observation for diuresis, pulmonary toilet.  I also think this patient has a component of pain-medicine seeking behavior.  I did not give her narcotics in the ED, rather chose analgesic dose ketamine which seemed to work for her.  She did continuously ask for po vicodin or percocet, initially asking for IV dilaudid.          Rhunette Croft, MD 07/29/12 [phone]

## 2012-07-29 NOTE — ED Notes (Addendum)
At baseline, pt does not ambulate. Vital signs checked after pt had been transferring from bed to bedside commode.  Pt's O2 saturation was maintained above 92%. Dr. Mitzi Hansen made aware.

## 2012-07-29 NOTE — Care Management Note (Signed)
    Page 1 of 1   07/29/2012     4:44:26 PM   CARE MANAGEMENT NOTE 07/29/2012  Patient:  Kara Vincent, Kara Vincent   Account Number:  [id number]  Date Initiated:  07/29/2012  Documentation initiated by:  Dessa Phi  Subjective/Objective Assessment:   ADMITTED W/SOB.ASTHMA.     Action/Plan:   FROM HOME,ALONE.HAS FRIEND VISITS PERIODICALLY.HAS CPAP,HOME 02,NEBS-[facility].USED [facility] IN PAST.HAS SCOOTER,RW,CANE.HAS PCP,PHARMACY.   Anticipated DC Date:  08/02/2012   Anticipated DC Plan:  [place]  CM consult      Choice offered to / List presented to:             Status of service:  In process, will continue to follow Medicare Important Message given?   (If response is "NO", the following Medicare IM given date fields will be blank) Date Medicare IM given:   Date Additional Medicare IM given:    Discharge Disposition:    Per UR Regulation:  Reviewed for med. necessity/level of care/duration of stay  If discussed at [place] of Stay Meetings, dates discussed:    Comments:  07/29/12 Marx Doig RN,BSN NCM [phone] SPOKE TO PATIENT ABOUT D/C PLANS.WANTS HOME [place].USED [facility] IN PAST, & AGREE TO THEIR SERVICES.CONTACED [facility] KRISTEN(REP) TO FOLLOW FOR ?HH.RECOMMEND PT/OT CONS.WOULD RECOMMEND HHRN-DISEASE MGMNT. TC/LEFT MESSAGE W/CALL BACK# [facility] DME REP ABOUT CONCERNS FOR TRAVEL TANK,& CPAP MACHINE NOT FUNCTIONING.PATIENT REQUESTING A [id number] NURSE'S AIDE.

## 2012-07-30 ENCOUNTER — Inpatient Hospital Stay (HOSPITAL_COMMUNITY): Payer: Medicaid Other

## 2012-07-30 DIAGNOSIS — J45909 Unspecified asthma, uncomplicated: Secondary | ICD-10-CM

## 2012-07-30 DIAGNOSIS — N179 Acute kidney failure, unspecified: Secondary | ICD-10-CM

## 2012-07-30 LAB — CBC
HCT: 38.4 % (ref 36.0–46.0)
MCHC: 29.9 g/dL — ABNORMAL LOW (ref 30.0–36.0)
MCV: 91.4 fL (ref 78.0–100.0)
Platelets: 256 10*3/uL (ref 150–400)
RDW: 15 % (ref 11.5–15.5)
WBC: 16.5 10*3/uL — ABNORMAL HIGH (ref 4.0–10.5)

## 2012-07-30 LAB — BASIC METABOLIC PANEL
BUN: 20 mg/dL (ref 6–23)
CO2: 34 mEq/L — ABNORMAL HIGH (ref 19–32)
Calcium: 8.7 mg/dL (ref 8.4–10.5)
Creatinine, Ser: 1.6 mg/dL — ABNORMAL HIGH (ref 0.50–1.10)
GFR calc Af Amer: 43 mL/min — ABNORMAL LOW (ref >90)

## 2012-07-30 MED ORDER — ALBUTEROL SULFATE (5 MG/ML) 0.5% IN NEBU
2.5000 mg | INHALATION_SOLUTION | Freq: Three times a day (TID) | RESPIRATORY_TRACT | Status: DC
  Administered 2012-07-31 – 2012-08-01 (×4): 2.5 mg via RESPIRATORY_TRACT
  Filled 2012-07-30 (×5): qty 0.5

## 2012-07-30 MED ORDER — IPRATROPIUM BROMIDE 0.02 % IN SOLN
0.5000 mg | Freq: Three times a day (TID) | RESPIRATORY_TRACT | Status: DC
  Administered 2012-07-31 – 2012-08-01 (×4): 0.5 mg via RESPIRATORY_TRACT
  Filled 2012-07-30 (×5): qty 2.5

## 2012-07-30 MED ORDER — ALBUTEROL SULFATE HFA 108 (90 BASE) MCG/ACT IN AERS
2.0000 | INHALATION_SPRAY | RESPIRATORY_TRACT | Status: DC | PRN
  Filled 2012-07-30: qty 6.7

## 2012-07-30 NOTE — Progress Notes (Signed)
[facility] PROGRESS NOTE  Kara Vincent [id number] DOB: October 08, 1965 DOA: 07/28/2012 PCP: Harvie Junior, MD  Assessment/Plan: Principal Problem:   Status asthmaticus: Stabilizing. They have been triggered by allergies. Continue nebulizers plus oxygen. She is on her baseline rate of 02. Continue prednisone Active Problems:   OBSTRUCTIVE SLEEP APNEA   HYPERTENSION   GERD   Morbid obesity   Folliculitis continue ketoconazole treatments   Acute diastolic heart failure: Wean off Lasix tomorrow.   Leukocytosis: Likely from steroids Acute renal failure: Recheck tomorrow. May be mild overdiuresis   Code Status: Full code Family Communication: Plan discussed with patient at bedside.  Disposition Plan: Home in the next few days with home health   Consultants:  None  Procedures:  None  Antibiotics:  None  HPI/Subjective: Patient still feeling very short of breath, fatigue  Objective: Filed Vitals:   07/30/12 0809 07/30/12 1206 07/30/12 1337 07/30/12 1628  BP:   92/66   Pulse:   72   Temp:   98.1 F (36.7 C)   TempSrc:   Oral   Resp:   18   Height:      Weight:      SpO2: 96% 96% 94% 90%    Intake/Output Summary (Last 24 hours) at 07/30/12 1834 Last data filed at 07/30/12 0900  Gross per 24 hour  Intake    600 ml  Output    800 ml  Net   -200 ml   Filed Weights   07/29/12 0811 07/30/12 0508  Weight: 185.3 kg (408 lb 8.2 oz) 185.5 kg (408 lb 15.3 oz)    Exam:   General:  Alert and oriented x3, moderate distress secondary to fatigue and breathing  Cardiovascular: Regular rate and rhythm, S1-S2, soft, 2/6 systolic ejection murmur  Respiratory: Moderate airway exchange, mild end expiratory wheezing bilaterally  Abdomen: Soft, morbidly obese, nontender, positive bowel sounds  Musculoskeletal: No clubbing or cyanosis, trace pitting edema   Data Reviewed: Basic Metabolic Panel:  Recent Labs Lab 07/28/12 2340 07/29/12 1550 07/30/12 0530   NA 138 140 140  K 3.6 4.7 4.4  CL 98 100 99  CO2 31 32 34*  GLUCOSE 124* 147* 118*  BUN 15 14 20   CREATININE 1.13* 1.18* 1.60*  CALCIUM 8.5 8.7 8.7   Liver Function Tests:  Recent Labs Lab 07/28/12 2340  AST 13  ALT 14  ALKPHOS 65  BILITOT 0.3  PROT 6.8  ALBUMIN 3.2*   CBC:  Recent Labs Lab 07/28/12 2340 07/29/12 1550 07/30/12 0530  WBC 12.2* 11.7* 16.5*  NEUTROABS 9.2*  --   --   HGB 12.6 12.9 11.5*  HCT 40.5 42.4 38.4  MCV 91.6 91.8 91.4  PLT 250 243 256   BNP (last 3 results)  Recent Labs  03/23/12 0007 05/09/12 0150 07/29/12 0210  PROBNP 133.5* 168.6* 492.0*   CBG: No results found for this basename: GLUCAP,  in the last 168 hours  Recent Results (from the past 240 hour(s))  MRSA PCR SCREENING     Status: Abnormal   Collection Time    07/29/12  8:52 AM      Result Value Range Status   MRSA by PCR POSITIVE (*) NEGATIVE Final   Comment:            The GeneXpert MRSA Assay (FDA     approved for NASAL specimens     only), is one component of a     comprehensive MRSA colonization     surveillance  program. It is not     intended to diagnose MRSA     infection nor to guide or     monitor treatment for     MRSA infections.     RESULT CALLED TO, READ BACK BY AND VERIFIED WITH:     BARK,T. RN @ 11:20 AM ON 07/29/12, NONAN, J.     Studies: Dg Chest 2 View  07/29/2012   IMPRESSION: Pulmonary vascular congestion with trace pleural fluid in the right minor fissure compatible with interstitial edema.  Stable cardiomegaly and no other acute cardiopulmonary abnormality.   Original Report Authenticated By: Roselyn Reef, M.D.    Dg Chest Port 1 View  07/30/2012  IMPRESSION: 1.  Technically limited examination secondary to patient's positioning with significant rightward rotation 2.  Query small left pleural effusion 3.  Otherwise, grossly stable chest x-ray   Original Report Authenticated By: Jacqulynn Cadet, M.D.     Scheduled Meds: . ipratropium  0.5 mg  Nebulization Q4H   And  . albuterol  5 mg Nebulization Q4H  . allopurinol  100 mg Oral Daily  . amitriptyline  75 mg Oral QHS  . aspirin  325 mg Oral Daily  . atorvastatin  20 mg Oral Daily  . Chlorhexidine Gluconate Cloth  6 each Topical Q0600  . clonazePAM  1 mg Oral BID  . cloNIDine  0.2 mg Oral BID  . darifenacin  7.5 mg Oral Daily  . enoxaparin (LOVENOX) injection  90 mg Subcutaneous Q24H  . fluticasone  1 spray Each Nare Daily  . furosemide  80 mg Intravenous BID  . ketoconazole   Topical Daily  . ketoconazole  1 application Topical 2 times weekly  . lisinopril  20 mg Oral BID  . lubiprostone  24 mcg Oral QPM  . mupirocin ointment  1 application Nasal BID  . naproxen  500 mg Oral BID WC  . omega-3 acid ethyl esters  2 g Oral BID  . pantoprazole  40 mg Oral Daily  . predniSONE  60 mg Oral Q breakfast  . sodium chloride  3 mL Intravenous Q12H   Continuous Infusions:   Principal Problem:   Status asthmaticus Active Problems:   OBSTRUCTIVE SLEEP APNEA   HYPERTENSION   GERD   Morbid obesity   Folliculitis   Acute diastolic heart failure   Leukocytosis    Time spent: 30 minutes    [facility] [place] Pager [phone] If 7PM-7AM, please contact night-coverage at [email or web address], password [facility] 07/30/2012, 6:34 PM  LOS: 2 days

## 2012-07-31 LAB — BASIC METABOLIC PANEL
CO2: 37 mEq/L — ABNORMAL HIGH (ref 19–32)
CO2: 37 mEq/L — ABNORMAL HIGH (ref 19–32)
Calcium: 8.5 mg/dL (ref 8.4–10.5)
Calcium: 8.4 mg/dL (ref 8.4–10.5)
Chloride: 92 mEq/L — ABNORMAL LOW (ref 96–112)
Chloride: 94 mEq/L — ABNORMAL LOW (ref 96–112)
Creatinine, Ser: 1.81 mg/dL — ABNORMAL HIGH (ref 0.50–1.10)
Glucose, Bld: 113 mg/dL — ABNORMAL HIGH (ref 70–99)
Glucose, Bld: 110 mg/dL — ABNORMAL HIGH (ref 70–99)
Potassium: 6.2 mEq/L — ABNORMAL HIGH (ref 3.5–5.1)
Sodium: 134 mEq/L — ABNORMAL LOW (ref 135–145)

## 2012-07-31 LAB — CBC
Hemoglobin: 12 g/dL (ref 12.0–15.0)
MCH: 28.2 pg (ref 26.0–34.0)
MCV: 92.7 fL (ref 78.0–100.0)
Platelets: 281 10*3/uL (ref 150–400)
RBC: 4.25 MIL/uL (ref 3.87–5.11)
WBC: 15.4 10*3/uL — ABNORMAL HIGH (ref 4.0–10.5)

## 2012-07-31 MED ORDER — MORPHINE SULFATE 2 MG/ML IJ SOLN
2.0000 mg | INTRAMUSCULAR | Status: DC | PRN
  Administered 2012-07-31: 2 mg via INTRAVENOUS
  Filled 2012-07-31: qty 1

## 2012-07-31 MED ORDER — SODIUM CHLORIDE 0.9 % IV SOLN
INTRAVENOUS | Status: DC
  Administered 2012-07-31: 14:00:00 via INTRAVENOUS

## 2012-07-31 MED ORDER — PREDNISONE 50 MG PO TABS
50.0000 mg | ORAL_TABLET | Freq: Every day | ORAL | Status: DC
  Administered 2012-08-01: 50 mg via ORAL
  Filled 2012-07-31 (×2): qty 1

## 2012-07-31 NOTE — Plan of Care (Signed)
Problem: ICU Phase Progression Outcomes Goal: Pain controlled with appropriate interventions Outcome: Not Progressing Rates pain at 10 at times

## 2012-07-31 NOTE — Progress Notes (Signed)
Pt BP 78/50 HR 70.  Asymptomatic.  Held Clonidine and Lisinopril.  MD notified.

## 2012-07-31 NOTE — Progress Notes (Signed)
[facility] PROGRESS NOTE  Kara Vincent [id number] DOB: 20-Dec-1965 DOA: 07/28/2012 PCP: Harvie Junior, MD  Assessment/Plan: Principal Problem:   Status asthmaticus: Stabilizing. They have been triggered by allergies. Continue nebulizers plus oxygen. She is on her baseline rate of 02. Will wean prednisone Active Problems:   OBSTRUCTIVE SLEEP APNEA   HYPERTENSION   GERD   Morbid obesity   Folliculitis continue ketoconazole treatments   Acute diastolic heart failure: Lasix discontinue   Leukocytosis: Likely from steroids Acute renal failure: From overdiuresis. Have started gentle IV fluids   Code Status: Full code Family Communication: Plan discussed with patient at bedside.  Disposition Plan: Home tomorrow   Consultants:  None  Procedures:  None  Antibiotics:  None  HPI/Subjective: Pt cont to complain of hurting all over.    Objective: Filed Vitals:   07/31/12 0500 07/31/12 0520 07/31/12 1008 07/31/12 1353  BP:  114/70 78/50 122/52  Pulse:  67 70 69  Temp:  97.4 F (36.3 C)  97.4 F (36.3 C)  TempSrc:  Oral  Oral  Resp:  16  16  Height:      Weight: 188.2 kg (414 lb 14.5 oz)     SpO2:  98%  97%    Intake/Output Summary (Last 24 hours) at 07/31/12 1447 Last data filed at 07/31/12 0630  Gross per 24 hour  Intake    480 ml  Output    550 ml  Net    -70 ml   Filed Weights   07/29/12 0811 07/30/12 0508 07/31/12 0500  Weight: 185.3 kg (408 lb 8.2 oz) 185.5 kg (408 lb 15.3 oz) 188.2 kg (414 lb 14.5 oz)    Exam:   General:  Alert and oriented x3, moderate distress secondary to fatigue and breathing  Cardiovascular: Regular rate and rhythm, S1-S2, soft, 2/6 systolic ejection murmur  Respiratory: Moderate airway exchange, mild end expiratory wheezing bilaterally  Abdomen: Soft, morbidly obese, nontender, positive bowel sounds  Musculoskeletal: No clubbing or cyanosis, trace pitting edema   Data Reviewed: Basic Metabolic  Panel:  Recent Labs Lab 07/28/12 2340 07/29/12 1550 07/30/12 0530 07/31/12 0514 07/31/12 1206  NA 138 140 140 134* 136  K 3.6 4.7 4.4 6.2* 5.0  CL 98 100 99 92* 94*  CO2 31 32 34* 37* 37*  GLUCOSE 124* 147* 118* 113* 110*  BUN 15 14 20  38* 42*  CREATININE 1.13* 1.18* 1.60* 2.15* 1.81*  CALCIUM 8.5 8.7 8.7 8.5 8.4   Liver Function Tests:  Recent Labs Lab 07/28/12 2340  AST 13  ALT 14  ALKPHOS 65  BILITOT 0.3  PROT 6.8  ALBUMIN 3.2*   CBC:  Recent Labs Lab 07/28/12 2340 07/29/12 1550 07/30/12 0530 07/31/12 0514  WBC 12.2* 11.7* 16.5* 15.4*  NEUTROABS 9.2*  --   --   --   HGB 12.6 12.9 11.5* 12.0  HCT 40.5 42.4 38.4 39.4  MCV 91.6 91.8 91.4 92.7  PLT 250 243 256 281   BNP (last 3 results)  Recent Labs  03/23/12 0007 05/09/12 0150 07/29/12 0210  PROBNP 133.5* 168.6* 492.0*   CBG: No results found for this basename: GLUCAP,  in the last 168 hours  Recent Results (from the past 240 hour(s))  MRSA PCR SCREENING     Status: Abnormal   Collection Time    07/29/12  8:52 AM      Result Value Range Status   MRSA by PCR POSITIVE (*) NEGATIVE Final   Comment:  The GeneXpert MRSA Assay (FDA     approved for NASAL specimens     only), is one component of a     comprehensive MRSA colonization     surveillance program. It is not     intended to diagnose MRSA     infection nor to guide or     monitor treatment for     MRSA infections.     RESULT CALLED TO, READ BACK BY AND VERIFIED WITH:     BARK,T. RN @ 11:20 AM ON 07/29/12, NONAN, J.     Studies: Dg Chest 2 View  07/29/2012   IMPRESSION: Pulmonary vascular congestion with trace pleural fluid in the right minor fissure compatible with interstitial edema.  Stable cardiomegaly and no other acute cardiopulmonary abnormality.   Original Report Authenticated By: Roselyn Reef, M.D.    Dg Chest Port 1 View  07/30/2012  IMPRESSION: 1.  Technically limited examination secondary to patient's positioning with  significant rightward rotation 2.  Query small left pleural effusion 3.  Otherwise, grossly stable chest x-ray   Original Report Authenticated By: Jacqulynn Cadet, M.D.     Scheduled Meds: . albuterol  2.5 mg Nebulization TID   And  . ipratropium  0.5 mg Nebulization TID  . allopurinol  100 mg Oral Daily  . amitriptyline  75 mg Oral QHS  . aspirin  325 mg Oral Daily  . atorvastatin  20 mg Oral Daily  . Chlorhexidine Gluconate Cloth  6 each Topical Q0600  . clonazePAM  1 mg Oral BID  . cloNIDine  0.2 mg Oral BID  . darifenacin  7.5 mg Oral Daily  . enoxaparin (LOVENOX) injection  90 mg Subcutaneous Q24H  . fluticasone  1 spray Each Nare Daily  . ketoconazole   Topical Daily  . ketoconazole  1 application Topical 2 times weekly  . lisinopril  20 mg Oral BID  . lubiprostone  24 mcg Oral QPM  . mupirocin ointment  1 application Nasal BID  . naproxen  500 mg Oral BID WC  . omega-3 acid ethyl esters  2 g Oral BID  . pantoprazole  40 mg Oral Daily  . [START ON 08/01/2012] predniSONE  50 mg Oral Q breakfast  . sodium chloride  3 mL Intravenous Q12H   Continuous Infusions: . sodium chloride 75 mL/hr at 07/31/12 1344    Principal Problem:   Status asthmaticus Active Problems:   OBSTRUCTIVE SLEEP APNEA   HYPERTENSION   GERD   Morbid obesity   Folliculitis   Acute diastolic heart failure   Leukocytosis    Time spent: 25 minutes    [facility] Pager [phone] If 7PM-7AM, please contact night-coverage at [email or web address], password [facility] 07/31/2012, 2:47 PM  LOS: 3 days

## 2012-07-31 NOTE — Plan of Care (Signed)
Problem: Phase II Progression Outcomes Goal: Pain controlled on oral analgesia Outcome: Not Met (add Reason) States oral pain med is not effective------pt sleeping at times

## 2012-08-01 DIAGNOSIS — E875 Hyperkalemia: Secondary | ICD-10-CM | POA: Diagnosis present

## 2012-08-01 LAB — CBC
HCT: 39.4 % (ref 36.0–46.0)
MCH: 28.9 pg (ref 26.0–34.0)
MCV: 92.7 fL (ref 78.0–100.0)
Platelets: 301 10*3/uL (ref 150–400)
RBC: 4.25 MIL/uL (ref 3.87–5.11)
RDW: 15.4 % (ref 11.5–15.5)
WBC: 13.5 10*3/uL — ABNORMAL HIGH (ref 4.0–10.5)

## 2012-08-01 LAB — LIPID PANEL
LDL Cholesterol: 119 mg/dL — ABNORMAL HIGH (ref 0–99)
Total CHOL/HDL Ratio: 4.7 RATIO
VLDL: 51 mg/dL — ABNORMAL HIGH (ref 0–40)

## 2012-08-01 LAB — BASIC METABOLIC PANEL
BUN: 39 mg/dL — ABNORMAL HIGH (ref 6–23)
CO2: 33 mEq/L — ABNORMAL HIGH (ref 19–32)
Calcium: 8.4 mg/dL (ref 8.4–10.5)
Chloride: 95 mEq/L — ABNORMAL LOW (ref 96–112)
Creatinine, Ser: 1.4 mg/dL — ABNORMAL HIGH (ref 0.50–1.10)

## 2012-08-01 MED ORDER — GUAIFENESIN 100 MG/5ML PO SYRP: 200.0000 mg | ORAL_SOLUTION | ORAL | Status: DC | PRN

## 2012-08-01 MED ORDER — GUAIFENESIN 100 MG/5ML PO SOLN
200.0000 mg | ORAL | Status: DC | PRN
  Administered 2012-08-01: 200 mg via ORAL
  Filled 2012-08-01: qty 118
  Filled 2012-08-01: qty 10

## 2012-08-01 MED ORDER — ALBUTEROL SULFATE (2.5 MG/3ML) 0.083% IN NEBU: 2.5000 mg | INHALATION_SOLUTION | Freq: Four times a day (QID) | RESPIRATORY_TRACT | Status: DC | PRN

## 2012-08-01 MED ORDER — HYDROCODONE-ACETAMINOPHEN 5-325 MG PO TABS: 1.0000 | ORAL_TABLET | Freq: Four times a day (QID) | ORAL | Status: DC | PRN

## 2012-08-01 MED ORDER — ISOSORBIDE MONONITRATE ER 30 MG PO TB24: 30.0000 mg | ORAL_TABLET | Freq: Every day | ORAL | Status: DC

## 2012-08-01 NOTE — Discharge Summary (Signed)
Physician Discharge Summary  Kara Vincent [id number] DOB: 28-Apr-1965 DOA: 07/28/2012  PCP: Harvie Junior, MD  Admit date: 07/28/2012 Discharge date: 08/01/2012  Time spent: 30 minutes  Recommendations for Outpatient Follow-up:  1. Patient will follow up with her primary care physician, Dr. [name] in the next few weeks  Discharge Diagnoses:  Principal Problem:   Status asthmaticus Active Problems:   OBSTRUCTIVE SLEEP APNEA   HYPERTENSION   GERD   Morbid obesity   Folliculitis   Acute diastolic heart failure   Leukocytosis   Hyperkalemia   Discharge Condition: Improved, being discharged home  Diet recommendation: Heart healthy  Filed Weights   07/30/12 0508 07/31/12 0500 08/01/12 0527  Weight: 185.5 kg (408 lb 15.3 oz) 188.2 kg (414 lb 14.5 oz) 189.5 kg (417 lb 12.4 oz)    History of present illness:  On 5/7:The patient is a 47 y.o. year-old female with history of asthma, OSA, OHS, HTN, HLD, morbid obesity, gout, chronic pain, homelessness who presents with increased SOB for the last 2-3 days. She was recently admitted for respiratory failure due to CHF exacerbation, OSA, OHS, and asthma in mid-February and was discharged. She has had 2 visits to the ER since that time, the last for SOB on 4/25 and discharged to home. The patient was last at their baseline health 4 days ago when she developed productive cough of yellow sputum, wheeze, constant substernal chest tightness. She has been using her inhaler every 4-6 hours and it has only been helping a little. She has noticed increased lower extremity edema for the last 2-3 weeks and has been taking her lasix as prescribed but her swelling has increased. + orthopnea, + PND more frequently than baseline. She has been wearing her CPAP and oxygen regularly. Felt nauseated without vomiting that improved with antiemetic. + rhinorrhea, sinus congestion. Denies fevers.    Hospital Course:  Principal Problem:   Status asthmaticus:  Patient treated with oxygen, nebulizers and steroids. By hospital day 3, she was breathing on her normal oxygen rate of 2.5 L.    OBSTRUCTIVE SLEEP APNEA: Continued on nightly CPAP    HYPERTENSION: Had to discontinue her ACE inhibitor because of hyperkalemia persistent. Started her on Imdur.    GERD   Morbid obesity   Folliculitis: Continued on Nizoral tablet plus topical    Acute diastolic heart failure: Initial BNP was only 492, but this was falsely lowered given her severe morbid obesity. She was diuresed and responded quickly to Lasix, but then started to come over diuresed. Lasix was stopped and she was gently hydrated creatinine on day of discharge was at 1.4    Leukocytosis: Felt to be more from steroids. Afebrile. White count improved as steroids decreased    Hyperkalemia: Persistent elevations up to be secondary to ACE inhibitor. Medication discontinued and patient changed over to Imdur  Procedures:  None  Consultations:  None  Discharge Exam: Filed Vitals:   07/31/12 2153 07/31/12 2208 08/01/12 0527 08/01/12 0732  BP: 88/73 98/56 106/68   Pulse: 69  70   Temp: 97.4 F (36.3 C)  98.2 F (36.8 C)   TempSrc: Oral  Oral   Resp: 16  20   Height:      Weight:   189.5 kg (417 lb 12.4 oz)   SpO2: 96%  98% 97%    General: Alert and oriented x3, [place] distress although complains of hurting all over Cardiovascular: Regular rate and rhythm, S1-S2 Respiratory: Decreased breath sounds throughout, minimal end expiratory  wheezing, improved Abdomen: Soft, morbidly obese, nontender, positive bowel sounds Extremities: Trace pitting edema bilaterally  Discharge Instructions  Discharge Orders   Future Orders Complete By Expires     Diet - low sodium heart healthy  As directed     Increase activity slowly  As directed         Medication List    STOP taking these medications       lisinopril 20 MG tablet  Commonly known as:  PRINIVIL,ZESTRIL      TAKE these  medications       allopurinol 100 MG tablet  Commonly known as:  ZYLOPRIM  Take 100 mg by mouth daily.     amitriptyline 75 MG tablet  Commonly known as:  ELAVIL  Take 1 tablet (75 mg total) by mouth at bedtime.     ammonium lactate 12 % cream  Commonly known as:  AMLACTIN  Apply 1 g topically as needed. Applies to back and legs.Marland KitchenMarland KitchenIn a thin layer on affected area for dry patches.     aspirin 325 MG tablet  Take 325 mg by mouth daily.     atorvastatin 20 MG tablet  Commonly known as:  LIPITOR  Take 20 mg by mouth daily.     clonazePAM 1 MG tablet  Commonly known as:  KLONOPIN  Take 1 mg by mouth 2 (two) times daily.     cloNIDine 0.2 MG tablet  Commonly known as:  CATAPRES  Take 0.2 mg by mouth 2 (two) times daily.     diphenhydrAMINE 25 mg capsule  Commonly known as:  BENADRYL  Take 25 mg by mouth every 6 (six) hours as needed. For itching     eucerin lotion  Apply topically 2 (two) times daily as needed. On affected areas for dry patches.     furosemide 40 MG tablet  Commonly known as:  LASIX  Take 1 tablet (40 mg total) by mouth daily.     guaifenesin 100 MG/5ML syrup  Commonly known as:  ROBITUSSIN  Take 10 mLs (200 mg total) by mouth every 4 (four) hours as needed for cough.     HYDROcodone-acetaminophen 5-325 MG per tablet  Commonly known as:  NORCO/VICODIN  Take 1-2 tablets by mouth every 6 (six) hours as needed for pain.     ipratropium 0.02 % nebulizer solution  Commonly known as:  ATROVENT  Take 2.5 mLs (0.5 mg total) by nebulization every 4 (four) hours.     isosorbide mononitrate 30 MG 24 hr tablet  Commonly known as:  IMDUR  Take 1 tablet (30 mg total) by mouth daily.     ketoconazole 2 % cream  Commonly known as:  NIZORAL  Apply topically daily.     ketoconazole 2 % shampoo  Commonly known as:  NIZORAL  Apply 1 application topically 2 (two) times a week.     ketoconazole 200 MG tablet  Commonly known as:  NIZORAL  Take 200 mg by mouth  daily.     lubiprostone 24 MCG capsule  Commonly known as:  AMITIZA  Take 24 mcg by mouth every evening.     naproxen 250 MG tablet  Commonly known as:  NAPROSYN  Take 250 mg by mouth 2 (two) times daily with a meal.     omega-3 acid ethyl esters 1 G capsule  Commonly known as:  LOVAZA  Take 2 g by mouth 2 (two) times daily.     pantoprazole 40 MG tablet  Commonly known  as:  PROTONIX  Take 40 mg by mouth daily.     predniSONE 50 MG tablet  Commonly known as:  DELTASONE  Take 1 tablet (50 mg total) by mouth daily. Starting tomorrow morning     promethazine 25 MG tablet  Commonly known as:  PHENERGAN  Take 25 mg by mouth every 6 (six) hours as needed. For nausea/vomiting     PROVENTIL HFA 108 (90 BASE) MCG/ACT inhaler  Generic drug:  albuterol  Inhale 2 puffs into the lungs every 4 (four) hours as needed. For asthma relief     albuterol (2.5 MG/3ML) 0.083% nebulizer solution  Commonly known as:  PROVENTIL  Take 3 mLs (2.5 mg total) by nebulization every 6 (six) hours as needed for wheezing.     solifenacin 5 MG tablet  Commonly known as:  VESICARE  Take 5 mg by mouth daily.     VITAMIN E PO  Take 1 tablet by mouth daily.       Allergies  Allergen Reactions  . Ibuprofen Hives, Shortness Of Breath and Palpitations  . Adhesive (Tape) Rash  . Azithromycin Hives and Rash  . Doxycycline Rash  . Sulfa Antibiotics Rash  . Sulfamethoxazole W-Trimethoprim Rash       Follow-up Information   Follow up with Harvie Junior, MD In 1 month.   Contact information:   [address] [place] [address] [phone]        The results of significant diagnostics from this hospitalization (including imaging, microbiology, ancillary and laboratory) are listed below for reference.    Significant Diagnostic Studies: Dg Chest 2 View  07/29/2012 IMPRESSION: Pulmonary vascular congestion with trace pleural fluid in the right minor fissure compatible with interstitial  edema.  Stable cardiomegaly and no other acute cardiopulmonary abnormality.   Original Report Authenticated By: Roselyn Reef, M.D.      Dg Chest Port 1 View  07/30/2012   IMPRESSION: 1.  Technically limited examination secondary to patient's positioning with significant rightward rotation 2.  Query small left pleural effusion 3.  Otherwise, grossly stable chest x-ray   Original Report Authenticated By: Jacqulynn Cadet, M.D.     Microbiology: Recent Results (from the past 240 hour(s))  MRSA PCR SCREENING     Status: Abnormal   Collection Time    07/29/12  8:52 AM      Result Value Range Status   MRSA by PCR POSITIVE (*) NEGATIVE Final   Comment:            The GeneXpert MRSA Assay (FDA     approved for NASAL specimens     only), is one component of a     comprehensive MRSA colonization     surveillance program. It is not     intended to diagnose MRSA     infection nor to guide or     monitor treatment for     MRSA infections.     RESULT CALLED TO, READ BACK BY AND VERIFIED WITH:     BARK,T. RN @ 11:20 AM ON 07/29/12, Jetta Lout, J.     Labs: Basic Metabolic Panel:  Recent Labs Lab 07/29/12 1550 07/30/12 0530 07/31/12 0514 07/31/12 1206 08/01/12 0445  NA 140 140 134* 136 135  K 4.7 4.4 6.2* 5.0 6.2*  CL 100 99 92* 94* 95*  CO2 32 34* 37* 37* 33*  GLUCOSE 147* 118* 113* 110* 100*  BUN 14 20 38* 42* 39*  CREATININE 1.18* 1.60* 2.15* 1.81* 1.40*  CALCIUM 8.7 8.7 8.5 8.4 8.4   Liver Function Tests:  Recent Labs Lab 07/28/12 2340  AST 13  ALT 14  ALKPHOS 65  BILITOT 0.3  PROT 6.8  ALBUMIN 3.2*   CBC:  Recent Labs Lab 07/28/12 2340 07/29/12 1550 07/30/12 0530 07/31/12 0514 08/01/12 0445  WBC 12.2* 11.7* 16.5* 15.4* 13.5*  NEUTROABS 9.2*  --   --   --   --   HGB 12.6 12.9 11.5* 12.0 12.3  HCT 40.5 42.4 38.4 39.4 39.4  MCV 91.6 91.8 91.4 92.7 92.7  PLT 250 243 256 281 301   Cardiac Enzymes: No results found for this basename: CKTOTAL, CKMB, CKMBINDEX,  TROPONINI,  in the last 168 hours BNP: BNP (last 3 results)  Recent Labs  03/23/12 0007 05/09/12 0150 07/29/12 0210  PROBNP 133.5* 168.6* 492.0*      Signed:  Giliana Vantil K  [facility] 08/01/2012, 12:45 PM

## 2012-08-01 NOTE — Evaluation (Signed)
Physical Therapy Evaluation Patient Details Name: Kara Vincent MRN: [id number] DOB: February 28, 1966 Today's Date: 08/01/2012 Time: 1105-1140 PT Time Calculation (min): 35 min  PT Assessment / Plan / Recommendation Clinical Impression  Pt presents with status asthmaticus with history of HTN, OSA, chronic pain and CHF.  Tolerated OOB and ambulation to/from restroom and some in hallway.  Note that pt fatigues quickly and requires sitting rest breaks.  See ambulation section for SaO2 with amb on 2L O2.  Pt will benefit from skilled PT in acute venue to address deficits.  PT recommends HHPT for follow up at D/C to maximize pts safety and function.     PT Assessment  Patient needs continued PT services    Follow Up Recommendations  Home health PT;Supervision for mobility/OOB    Does the patient have the potential to tolerate intense rehabilitation      Barriers to Discharge Decreased caregiver support      Equipment Recommendations  Rolling walker with 5" wheels (bari RW)    Recommendations for Other Services     Frequency Min 3X/week    Precautions / Restrictions Precautions Precautions: Fall Precaution Comments: monitor O2 Restrictions Weight Bearing Restrictions: No   Pertinent Vitals/Pain Pt states pain in her "sides" (points to hip area) with ambulation.       Mobility  Bed Mobility Bed Mobility: Supine to Sit Supine to Sit: HOB elevated;With rails;5: Supervision Details for Bed Mobility Assistance: Pt able to get to EOB using rails at supervision level, however with increased difficulty and increased time due to body habitus.  Transfers Transfers: Sit to Stand;Stand to Sit Sit to Stand: 4: Min guard;With upper extremity assist;From bed;From toilet Stand to Sit: 4: Min guard;With upper extremity assist;With armrests;To chair/3-in-1;To toilet Details for Transfer Assistance: Min/guard for safety with cues for hand placement and safety when sitting/standing.   Ambulation/Gait Ambulation/Gait Assistance: 4: Min assist;4: Min guard Ambulation Distance (Feet): 12 Feet (then 35 x 2 reps with seated rest break in between) Assistive device: Rolling walker Ambulation/Gait Assistance Details: Pt mostly stable using RW, however did note some SOB and pt did fatigue easily.  Ambulated on 2L O2 with SaO2 at 97% prior to activity and 91% following amb to restroom.  Note that it did drop to high 80's following first 35', however she was able to quickly increase sats to 91% once seated.  Gait Pattern: Step-to pattern;Decreased stride length;Wide base of support Gait velocity: decreased    Exercises     PT Diagnosis: Difficulty walking;Generalized weakness;Acute pain  PT Problem List: Decreased strength;Decreased activity tolerance;Decreased balance;Decreased mobility;Decreased coordination;Obesity;Cardiopulmonary status limiting activity;Decreased knowledge of use of DME;Decreased safety awareness;Decreased knowledge of precautions;Pain PT Treatment Interventions: DME instruction;Gait training;Functional mobility training;Therapeutic activities;Therapeutic exercise;Balance training;Patient/family education   PT Goals Acute Rehab PT Goals PT Goal Formulation: With patient Time For Goal Achievement: 08/08/12 Potential to Achieve Goals: Good Pt will go Supine/Side to Sit: with modified independence;with HOB 0 degrees;Other (comment) (without rail) PT Goal: Supine/Side to Sit - Progress: Goal set today Pt will go Sit to Supine/Side: with modified independence PT Goal: Sit to Supine/Side - Progress: Goal set today Pt will go Sit to Stand: with modified independence PT Goal: Sit to Stand - Progress: Goal set today Pt will Ambulate: 51 - 150 feet;with modified independence;with least restrictive assistive device PT Goal: Ambulate - Progress: Goal set today Pt will Perform Home Exercise Program: with supervision, verbal cues required/provided PT Goal: Perform Home  Exercise Program - Progress: Goal set today  Visit Information  Last PT Received On: 08/01/12 Assistance Needed: +1 (with additional +1 for safety/lines/chair)    Subjective Data  Subjective: I usually walk with a cane at home.  Patient Stated Goal: to return home.    Prior Functioning  Home Living Lives With: Friend(s) (Friend only lives there sometimes) Available Help at Discharge: Friend(s);Available PRN/intermittently Type of Home: Apartment Home Access: Level entry Home Layout: One level Bathroom Shower/Tub: Chiropodist: Standard Home Adaptive Equipment: Straight cane;Quad cane;Wheelchair - powered;Bedside commode/3-in-1;Walker - standard Additional Comments: Has shower chair but too small to fit inside shower.  Prior Function Level of Independence: Needs assistance Needs Assistance: Meal Prep;Light Housekeeping Meal Prep: Total Light Housekeeping: Total Vocation: Unemployed Comments: uses quad cane inside apartment.  Communication Communication:  (difficult to understand at times)    Cognition  Cognition Arousal/Alertness: Lethargic Behavior During Therapy: WFL for tasks assessed/performed Overall Cognitive Status: Within Functional Limits for tasks assessed    Extremity/Trunk Assessment Right Lower Extremity Assessment RLE ROM/Strength/Tone: [facility] for tasks assessed Left Lower Extremity Assessment LLE ROM/Strength/Tone: [facility] for tasks assessed Trunk Assessment Trunk Assessment: Kyphotic   Balance    End of Session PT - End of Session Equipment Utilized During Treatment: Oxygen Activity Tolerance: Patient limited by fatigue;Patient limited by pain Patient left: in chair;with call bell/phone within reach Nurse Communication: Mobility status  GP     Denice Bors 08/01/2012, 12:58 PM

## 2012-08-01 NOTE — Evaluation (Signed)
Occupational Therapy Evaluation Patient Details Name: DANIRA BABAYAN MRN: [id number] DOB: Nov 07, 1965 Today's Date: 08/01/2012 Time: 1105-1150 OT Time Calculation (min): 45 min  OT Assessment / Plan / Recommendation Clinical Impression  This 47 yo female admitted with SOB and found to be having an asthma exacerbation presents to acute OT with problems below. Will benefit from acute OT without need for follow up.    OT Assessment  Patient needs continued OT Services    Follow Up Recommendations  No OT follow up    Barriers to Discharge None    Equipment Recommendations  3 in 1 bedside comode       Frequency  Min 2X/week    Precautions / Restrictions Precautions Precautions: Fall Precaution Comments: monitor O2 Restrictions Weight Bearing Restrictions: No       ADL  Eating/Feeding: Simulated;Independent Where Assessed - Eating/Feeding: Edge of bed Grooming: Simulated;Set up Where Assessed - Grooming: Unsupported sitting Upper Body Bathing: Simulated;Set up Where Assessed - Upper Body Bathing: Unsupported sitting Lower Body Bathing: Simulated;Minimal assistance Where Assessed - Lower Body Bathing: Supported sit to stand Upper Body Dressing: Simulated;Set up Where Assessed - Upper Body Dressing: Unsupported sitting Lower Body Dressing: Simulated;Minimal assistance Where Assessed - Lower Body Dressing: Unsupported sit to stand Toilet Transfer: Performed;Minimal assistance Toilet Transfer Method: Sit to stand Toilet Transfer Equipment: Regular height toilet;Grab bars Toileting - Clothing Manipulation and Hygiene: Performed;Min guard Where Assessed - Best boy and Hygiene: Standing Equipment Used: Rolling walker (O2 on 2 liters) Transfers/Ambulation Related to ADLs: Min guard A for all with RW ADL Comments: Pt bends forward to don her underwear    OT Diagnosis: Generalized weakness  OT Problem List: Decreased knowledge of use of DME or AE OT  Treatment Interventions: Self-care/ADL training;DME and/or AE instruction;Energy conservation;Patient/family education   OT Goals Acute Rehab OT Goals OT Goal Formulation: With patient Time For Goal Achievement: 08/08/12 Potential to Achieve Goals: Good Miscellaneous OT Goals Miscellaneous OT Goal #1: Pt will Mod I with AE use. OT Goal: Miscellaneous Goal #1 - Progress: Goal set today Miscellaneous OT Goal #2: Pt will be aware of energy conservation stratgies that may benefit her in her recovery and beyond. OT Goal: Miscellaneous Goal #2 - Progress: Goal set today  Visit Information  Last OT Received On: 08/01/12 Assistance Needed: +1 (with additional +1 for safety/lines/chair) PT/OT Co-Evaluation/Treatment: Yes       Prior Functioning     Home Living Lives With: Friend(s) (Friend only lives there sometimes) Available Help at Discharge: Friend(s);Available PRN/intermittently Type of Home: Apartment Home Access: Level entry Home Layout: One level Bathroom Shower/Tub: Chiropodist: Standard Home Adaptive Equipment: Straight cane;Quad cane;Wheelchair - powered;Bedside commode/3-in-1;Walker - standard Additional Comments: Has shower chair but too small to fit inside shower.  Prior Function Level of Independence: Needs assistance Needs Assistance: Meal Prep;Light Housekeeping Meal Prep: Total Light Housekeeping: Total Vocation: Unemployed Comments: uses quad cane inside apartment.  Communication Communication:  (difficult to understand at times)         Vision/Perception Vision - History Baseline Vision: No visual deficits   Cognition  Cognition Arousal/Alertness: Lethargic Behavior During Therapy: WFL for tasks assessed/performed Overall Cognitive Status: Within Functional Limits for tasks assessed    Extremity/Trunk Assessment Right Upper Extremity Assessment RUE ROM/Strength/Tone: Within functional levels Left Upper Extremity Assessment LUE  ROM/Strength/Tone: Within functional levels Right Lower Extremity Assessment RLE ROM/Strength/Tone: [facility] for tasks assessed Left Lower Extremity Assessment LLE ROM/Strength/Tone: [facility] for tasks assessed Trunk Assessment Trunk Assessment:  Kyphotic     Mobility Bed Mobility Bed Mobility: Supine to Sit Supine to Sit: HOB elevated;With rails;5: Supervision Details for Bed Mobility Assistance: Pt able to get to EOB using rails at supervision level, however with increased difficulty and increased time due to body habitus.  Transfers Sit to Stand: 4: Min guard;With upper extremity assist;From bed;From toilet Stand to Sit: 4: Min guard;With upper extremity assist;With armrests;To chair/3-in-1;To toilet Details for Transfer Assistance: Min/guard for safety with cues for hand placement and safety when sitting/standing.            End of Session OT - End of Session Activity Tolerance: Patient limited by fatigue Patient left: in chair;with call bell/phone within reach Nurse Communication:  (IV reading occluded)       Almon Register [id number] 08/01/2012, 2:35 PM

## 2012-08-01 NOTE — Progress Notes (Signed)
Clinical Social Work Department BRIEF PSYCHOSOCIAL ASSESSMENT 08/01/2012  Patient:  Kara Vincent, Kara Vincent     Account Number:  [id number]     Admit date:  07/28/2012  Clinical Social Worker:  Levie Heritage  Date/Time:  08/01/2012 01:45 PM  Referred by:  Physician  Date Referred:  08/01/2012 Referred for  Homelessness   Other Referral:   Interview type:  Patient Other interview type:    PSYCHOSOCIAL DATA Living Status:  ALONE Admitted from facility:   Level of care:   Primary support name:  Boykin Reaper Primary support relationship to patient:  PARENT Degree of support available:   unk    CURRENT CONCERNS Current Concerns  Other - See comment   Other Concerns:   Homeles    SOCIAL WORK ASSESSMENT / PLAN Met with Pt to discuss d/c needs.    Pt stated that she's not ready to d/c today, as she doesn't feel that she's ready.  Additionally, Pt stated that she has no way to get into her home.    CSW discussed with Pt MD's feeling that she's ready and that she is well enough to d/c today and that she should f/u with her PCP.    Pt voiced an understanding and stated that she will call her friend who has her key and tell him to meet her at her house; Pt is not homeless.  Pt stated that she usually goes by [facility] home.  CSW contacted [employer] and learned that Pt is not in their system; she does not use their service.  Per Care Coordinator, Marissa, Pt was d/c'd home last month with a bus pass.    CSW provided Pt with a bus pass.    CSW thanked Pt for her time.    Notified RN that Pt doesn't feel that she's ready for d/c.    RN to notify MD.   Assessment/plan status:  No Further Intervention Required Other assessment/ plan:   Information/referral to community resources:   n/a    PATIENT'S/FAMILY'S RESPONSE TO PLAN OF CARE: Pt thanked CSW for time and assistance.   Bernita Raisin, [place] Work [phone]

## 2012-08-08 ENCOUNTER — Emergency Department (HOSPITAL_COMMUNITY): Payer: Medicaid Other

## 2012-08-08 ENCOUNTER — Encounter (HOSPITAL_COMMUNITY): Payer: Self-pay | Admitting: *Deleted

## 2012-08-08 ENCOUNTER — Inpatient Hospital Stay (HOSPITAL_COMMUNITY)
Admission: EM | Admit: 2012-08-08 | Discharge: 2012-08-11 | DRG: 690 | Disposition: A | Discharge status: Home / Self Care | Payer: Medicaid Other | Attending: Internal Medicine | Admitting: Internal Medicine

## 2012-08-08 DIAGNOSIS — L739 Follicular disorder, unspecified: Secondary | ICD-10-CM

## 2012-08-08 DIAGNOSIS — I129 Hypertensive chronic kidney disease with stage 1 through stage 4 chronic kidney disease, or unspecified chronic kidney disease: Secondary | ICD-10-CM | POA: Diagnosis present

## 2012-08-08 DIAGNOSIS — G4733 Obstructive sleep apnea (adult) (pediatric): Secondary | ICD-10-CM | POA: Diagnosis present

## 2012-08-08 DIAGNOSIS — Z6841 Body Mass Index (BMI) 40.0 and over, adult: Secondary | ICD-10-CM

## 2012-08-08 DIAGNOSIS — L0292 Furuncle, unspecified: Secondary | ICD-10-CM

## 2012-08-08 DIAGNOSIS — E860 Dehydration: Secondary | ICD-10-CM | POA: Diagnosis present

## 2012-08-08 DIAGNOSIS — R0602 Shortness of breath: Secondary | ICD-10-CM

## 2012-08-08 DIAGNOSIS — M549 Dorsalgia, unspecified: Secondary | ICD-10-CM

## 2012-08-08 DIAGNOSIS — G4700 Insomnia, unspecified: Secondary | ICD-10-CM

## 2012-08-08 DIAGNOSIS — N3941 Urge incontinence: Secondary | ICD-10-CM

## 2012-08-08 DIAGNOSIS — I509 Heart failure, unspecified: Secondary | ICD-10-CM | POA: Diagnosis present

## 2012-08-08 DIAGNOSIS — Q828 Other specified congenital malformations of skin: Secondary | ICD-10-CM

## 2012-08-08 DIAGNOSIS — N189 Chronic kidney disease, unspecified: Secondary | ICD-10-CM

## 2012-08-08 DIAGNOSIS — N179 Acute kidney failure, unspecified: Secondary | ICD-10-CM | POA: Diagnosis present

## 2012-08-08 DIAGNOSIS — K219 Gastro-esophageal reflux disease without esophagitis: Secondary | ICD-10-CM | POA: Diagnosis present

## 2012-08-08 DIAGNOSIS — J9610 Chronic respiratory failure, unspecified whether with hypoxia or hypercapnia: Secondary | ICD-10-CM | POA: Diagnosis present

## 2012-08-08 DIAGNOSIS — R197 Diarrhea, unspecified: Secondary | ICD-10-CM

## 2012-08-08 DIAGNOSIS — J189 Pneumonia, unspecified organism: Secondary | ICD-10-CM

## 2012-08-08 DIAGNOSIS — I5032 Chronic diastolic (congestive) heart failure: Secondary | ICD-10-CM | POA: Diagnosis present

## 2012-08-08 DIAGNOSIS — Z9981 Dependence on supplemental oxygen: Secondary | ICD-10-CM

## 2012-08-08 DIAGNOSIS — R300 Dysuria: Secondary | ICD-10-CM

## 2012-08-08 DIAGNOSIS — I10 Essential (primary) hypertension: Secondary | ICD-10-CM | POA: Diagnosis present

## 2012-08-08 DIAGNOSIS — J45902 Unspecified asthma with status asthmaticus: Secondary | ICD-10-CM

## 2012-08-08 DIAGNOSIS — G8929 Other chronic pain: Secondary | ICD-10-CM | POA: Diagnosis present

## 2012-08-08 DIAGNOSIS — I959 Hypotension, unspecified: Secondary | ICD-10-CM | POA: Diagnosis present

## 2012-08-08 DIAGNOSIS — E869 Volume depletion, unspecified: Secondary | ICD-10-CM | POA: Diagnosis present

## 2012-08-08 DIAGNOSIS — N390 Urinary tract infection, site not specified: Principal | ICD-10-CM | POA: Diagnosis present

## 2012-08-08 DIAGNOSIS — E875 Hyperkalemia: Secondary | ICD-10-CM | POA: Diagnosis present

## 2012-08-08 DIAGNOSIS — I5031 Acute diastolic (congestive) heart failure: Secondary | ICD-10-CM

## 2012-08-08 DIAGNOSIS — N183 Chronic kidney disease, stage 3 unspecified: Secondary | ICD-10-CM

## 2012-08-08 DIAGNOSIS — D72829 Elevated white blood cell count, unspecified: Secondary | ICD-10-CM | POA: Diagnosis present

## 2012-08-08 DIAGNOSIS — E662 Morbid (severe) obesity with alveolar hypoventilation: Secondary | ICD-10-CM | POA: Diagnosis present

## 2012-08-08 DIAGNOSIS — J45909 Unspecified asthma, uncomplicated: Secondary | ICD-10-CM | POA: Diagnosis present

## 2012-08-08 LAB — CBC WITH DIFFERENTIAL/PLATELET
Basophils Absolute: 0 10*3/uL (ref 0.0–0.1)
Basophils Relative: 0 % (ref 0–1)
Eosinophils Relative: 3 % (ref 0–5)
HCT: 39.7 % (ref 36.0–46.0)
Hemoglobin: 12 g/dL (ref 12.0–15.0)
MCHC: 30.2 g/dL (ref 30.0–36.0)
MCV: 92.8 fL (ref 78.0–100.0)
Monocytes Absolute: 0.8 10*3/uL (ref 0.1–1.0)
Monocytes Relative: 7 % (ref 3–12)
Neutro Abs: 7 10*3/uL (ref 1.7–7.7)
RDW: 15.5 % (ref 11.5–15.5)

## 2012-08-08 LAB — COMPREHENSIVE METABOLIC PANEL
Albumin: 3.2 g/dL — ABNORMAL LOW (ref 3.5–5.2)
BUN: 24 mg/dL — ABNORMAL HIGH (ref 6–23)
CO2: 32 mEq/L (ref 19–32)
Calcium: 8.9 mg/dL (ref 8.4–10.5)
Chloride: 99 mEq/L (ref 96–112)
Creatinine, Ser: 1.7 mg/dL — ABNORMAL HIGH (ref 0.50–1.10)
GFR calc non Af Amer: 35 mL/min — ABNORMAL LOW (ref >90)
Total Bilirubin: 0.5 mg/dL (ref 0.3–1.2)

## 2012-08-08 LAB — URINE MICROSCOPIC-ADD ON

## 2012-08-08 LAB — URINALYSIS, ROUTINE W REFLEX MICROSCOPIC
Nitrite: NEGATIVE
Protein, ur: 30 mg/dL — AB
Specific Gravity, Urine: 1.031 — ABNORMAL HIGH (ref 1.005–1.030)
Urobilinogen, UA: 1 mg/dL (ref 0.0–1.0)

## 2012-08-08 LAB — CG4 I-STAT (LACTIC ACID): Lactic Acid, Venous: 1.2 mmol/L (ref 0.5–2.2)

## 2012-08-08 MED ORDER — ALLOPURINOL 100 MG PO TABS
100.0000 mg | ORAL_TABLET | Freq: Every morning | ORAL | Status: DC
Start: 2012-08-09 — End: 2012-08-11
  Administered 2012-08-09 – 2012-08-11 (×3): 100 mg via ORAL
  Filled 2012-08-08 (×3): qty 1

## 2012-08-08 MED ORDER — LUBIPROSTONE 24 MCG PO CAPS
24.0000 ug | ORAL_CAPSULE | Freq: Every day | ORAL | Status: DC | PRN
  Filled 2012-08-08: qty 1

## 2012-08-08 MED ORDER — ATORVASTATIN CALCIUM 20 MG PO TABS
20.0000 mg | ORAL_TABLET | Freq: Every evening | ORAL | Status: DC
  Administered 2012-08-08 – 2012-08-11 (×4): 20 mg via ORAL
  Filled 2012-08-08 (×5): qty 1

## 2012-08-08 MED ORDER — ACETAMINOPHEN 650 MG RE SUPP: 650.0000 mg | Freq: Four times a day (QID) | RECTAL | Status: DC | PRN

## 2012-08-08 MED ORDER — AMITRIPTYLINE HCL 25 MG PO TABS
75.0000 mg | ORAL_TABLET | Freq: Every day | ORAL | Status: DC
  Administered 2012-08-08 – 2012-08-10 (×3): 75 mg via ORAL
  Filled 2012-08-08 (×4): qty 3

## 2012-08-08 MED ORDER — DARIFENACIN HYDROBROMIDE ER 7.5 MG PO TB24
7.5000 mg | ORAL_TABLET | Freq: Every day | ORAL | Status: DC
  Administered 2012-08-09 – 2012-08-11 (×3): 7.5 mg via ORAL
  Filled 2012-08-08 (×3): qty 1

## 2012-08-08 MED ORDER — ALBUTEROL SULFATE (5 MG/ML) 0.5% IN NEBU
2.5000 mg | INHALATION_SOLUTION | Freq: Four times a day (QID) | RESPIRATORY_TRACT | Status: DC
  Administered 2012-08-08 (×2): 2.5 mg via RESPIRATORY_TRACT
  Filled 2012-08-08 (×2): qty 0.5

## 2012-08-08 MED ORDER — CLONAZEPAM 1 MG PO TABS
1.0000 mg | ORAL_TABLET | Freq: Two times a day (BID) | ORAL | Status: DC
  Administered 2012-08-08 – 2012-08-11 (×7): 1 mg via ORAL
  Filled 2012-08-08 (×7): qty 1

## 2012-08-08 MED ORDER — AMITRIPTYLINE HCL 75 MG PO TABS: 75.0000 mg | ORAL_TABLET | Freq: Every day | ORAL | Status: DC

## 2012-08-08 MED ORDER — ONDANSETRON HCL 4 MG PO TABS: 4.0000 mg | ORAL_TABLET | Freq: Four times a day (QID) | ORAL | Status: DC | PRN

## 2012-08-08 MED ORDER — ACETAMINOPHEN 325 MG PO TABS: 650.0000 mg | ORAL_TABLET | Freq: Four times a day (QID) | ORAL | Status: DC | PRN

## 2012-08-08 MED ORDER — SODIUM CHLORIDE 0.9 % IV BOLUS (SEPSIS)
1000.0000 mL | Freq: Once | INTRAVENOUS | Status: AC
  Administered 2012-08-08: 1000 mL via INTRAVENOUS

## 2012-08-08 MED ORDER — ONDANSETRON HCL 4 MG/2ML IJ SOLN: 4.0000 mg | Freq: Four times a day (QID) | INTRAMUSCULAR | Status: DC | PRN

## 2012-08-08 MED ORDER — LISINOPRIL 40 MG PO TABS: 40.0000 mg | ORAL_TABLET | Freq: Two times a day (BID) | ORAL | Status: DC

## 2012-08-08 MED ORDER — HYDROCODONE-ACETAMINOPHEN 5-325 MG PO TABS
1.0000 | ORAL_TABLET | Freq: Four times a day (QID) | ORAL | Status: DC | PRN
  Administered 2012-08-08 – 2012-08-11 (×10): 2 via ORAL
  Filled 2012-08-08 (×10): qty 2

## 2012-08-08 MED ORDER — IPRATROPIUM BROMIDE 0.02 % IN SOLN
0.5000 mg | Freq: Four times a day (QID) | RESPIRATORY_TRACT | Status: DC
  Administered 2012-08-08 (×2): 0.5 mg via RESPIRATORY_TRACT
  Filled 2012-08-08 (×2): qty 2.5

## 2012-08-08 MED ORDER — SODIUM CHLORIDE 0.9 % IJ SOLN
3.0000 mL | Freq: Two times a day (BID) | INTRAMUSCULAR | Status: DC
  Administered 2012-08-08 – 2012-08-11 (×6): 3 mL via INTRAVENOUS

## 2012-08-08 MED ORDER — SODIUM CHLORIDE 0.9 % IV BOLUS (SEPSIS): 500.0000 mL | Freq: Once | INTRAVENOUS | Status: DC

## 2012-08-08 MED ORDER — SODIUM CHLORIDE 0.9 % IV SOLN
INTRAVENOUS | Status: DC
  Administered 2012-08-08 (×2): via INTRAVENOUS

## 2012-08-08 MED ORDER — HYDROCODONE-ACETAMINOPHEN 5-325 MG PO TABS
1.0000 | ORAL_TABLET | Freq: Once | ORAL | Status: AC
  Administered 2012-08-08: 1 via ORAL
  Filled 2012-08-08: qty 1

## 2012-08-08 MED ORDER — DEXTROSE 5 % IV SOLN
1.0000 g | INTRAVENOUS | Status: DC
  Administered 2012-08-09 – 2012-08-10 (×2): 1 g via INTRAVENOUS
  Filled 2012-08-08 (×2): qty 10

## 2012-08-08 MED ORDER — DEXTROSE 5 % IV SOLN
2.0000 g | Freq: Once | INTRAVENOUS | Status: AC
  Administered 2012-08-08: 2 g via INTRAVENOUS
  Filled 2012-08-08: qty 2

## 2012-08-08 MED ORDER — SODIUM CHLORIDE 0.9 % IV BOLUS (SEPSIS)
1000.0000 mL | Freq: Once | INTRAVENOUS | Status: AC
  Administered 2012-08-08: 500 mL via INTRAVENOUS

## 2012-08-08 MED ORDER — AMITRIPTYLINE HCL 75 MG PO TABS
75.0000 mg | ORAL_TABLET | Freq: Every day | ORAL | Status: DC
  Filled 2012-08-08: qty 1

## 2012-08-08 MED ORDER — PANTOPRAZOLE SODIUM 40 MG PO TBEC
40.0000 mg | DELAYED_RELEASE_TABLET | Freq: Every morning | ORAL | Status: DC
  Administered 2012-08-09 – 2012-08-11 (×3): 40 mg via ORAL
  Filled 2012-08-08 (×3): qty 1

## 2012-08-08 MED ORDER — ALBUTEROL SULFATE (5 MG/ML) 0.5% IN NEBU: 2.5000 mg | INHALATION_SOLUTION | RESPIRATORY_TRACT | Status: DC | PRN

## 2012-08-08 MED ORDER — HEPARIN SODIUM (PORCINE) 5000 UNIT/ML IJ SOLN
5000.0000 [IU] | Freq: Three times a day (TID) | INTRAMUSCULAR | Status: DC
  Administered 2012-08-08 – 2012-08-11 (×10): 5000 [IU] via SUBCUTANEOUS
  Filled 2012-08-08 (×12): qty 1

## 2012-08-08 MED ORDER — ASPIRIN 325 MG PO TABS
325.0000 mg | ORAL_TABLET | Freq: Every evening | ORAL | Status: DC
  Administered 2012-08-08 – 2012-08-11 (×4): 325 mg via ORAL
  Filled 2012-08-08 (×4): qty 1

## 2012-08-08 MED ORDER — ISOSORBIDE MONONITRATE ER 30 MG PO TB24
30.0000 mg | ORAL_TABLET | Freq: Every morning | ORAL | Status: DC
  Filled 2012-08-08: qty 1

## 2012-08-08 MED ORDER — FUROSEMIDE 40 MG PO TABS
40.0000 mg | ORAL_TABLET | Freq: Every morning | ORAL | Status: DC
  Filled 2012-08-08: qty 1

## 2012-08-08 NOTE — Progress Notes (Signed)
Pt placed on Auto-CPAP 20/6 with 3 LPM O2 bleed in via nasal mask.  Pt tolerating well at this time RT to monitor and assess as needed.

## 2012-08-08 NOTE — ED Provider Notes (Signed)
Medical screening examination/treatment/procedure(s) were performed by non-physician practitioner and as supervising physician I was immediately available for consultation/collaboration.  Rhunette Croft, M.D.    Rhunette Croft, MD 08/08/12 [phone]

## 2012-08-08 NOTE — ED Provider Notes (Signed)
6:06 AM Sign out received from [facility], [place], at change of shift.  Patient with multiple medical problems presenting with leg swelling and dysuria.  Pt presented hypotensive, which was confirmed, and will need to be admitted.  Initially thought to have urosepsis, though UA is unclear.  2grams rocephin given for this, also 1L IVF given.  Pt does have CHF, will need to be monitored given the fluid given.  Plan is for admission.  Labs, CXR pending.    6:52 AM Patient reports she has pain all over, requests pain medication.  Pain is in low back, sides, legs, head, and hands. Reports swelling "in both arms and legs"  + dysuria.  X 3-4 days Also dizzy/lightheaded with sitting up/walking.  BP measured at 77/45 currently.  Pt denies SOB.  Lungs CTAB, abdomen soft, tender, no guarding, no rebound.  Exam very limited due to patient's large body habitus.  8:01 AM Currently 103/58.    8:21 AM Discussed patient with Dr Algis Liming, [facility] hospitalist, who will admit pt to obs for hypotension.  I have reordered UA with in and out cath as initial UA done with patient sitting on toilet with hat, likely contaminated.    8:27 AM Holding orders placed by me.  Team 4, tele.   Results for orders placed during the hospital encounter of 08/08/12  URINALYSIS, ROUTINE W REFLEX MICROSCOPIC      Result Value Range   Color, Urine YELLOW  YELLOW   APPearance CLOUDY (*) CLEAR   Specific Gravity, Urine 1.031 (*) 1.005 - 1.030   pH 5.0  5.0 - 8.0   Glucose, UA NEGATIVE  NEGATIVE mg/dL   Hgb urine dipstick NEGATIVE  NEGATIVE   Bilirubin Urine SMALL (*) NEGATIVE   Ketones, ur NEGATIVE  NEGATIVE mg/dL   Protein, ur 30 (*) NEGATIVE mg/dL   Urobilinogen, UA 1.0  0.0 - 1.0 mg/dL   Nitrite NEGATIVE  NEGATIVE   Leukocytes, UA SMALL (*) NEGATIVE  CBC WITH DIFFERENTIAL      Result Value Range   WBC 11.4 (*) 4.0 - 10.5 K/uL   RBC 4.28  3.87 - 5.11 MIL/uL   Hemoglobin 12.0  12.0 - 15.0 g/dL   HCT 39.7  36.0 - 46.0 %   MCV 92.8   78.0 - 100.0 fL   MCH 28.0  26.0 - 34.0 pg   MCHC 30.2  30.0 - 36.0 g/dL   RDW 15.5  11.5 - 15.5 %   Platelets 310  150 - 400 K/uL   Neutrophils Relative % 61  43 - 77 %   Neutro Abs 7.0  1.7 - 7.7 K/uL   Lymphocytes Relative 29  12 - 46 %   Lymphs Abs 3.3  0.7 - 4.0 K/uL   Monocytes Relative 7  3 - 12 %   Monocytes Absolute 0.8  0.1 - 1.0 K/uL   Eosinophils Relative 3  0 - 5 %   Eosinophils Absolute 0.3  0.0 - 0.7 K/uL   Basophils Relative 0  0 - 1 %   Basophils Absolute 0.0  0.0 - 0.1 K/uL  COMPREHENSIVE METABOLIC PANEL      Result Value Range   Sodium 137  135 - 145 mEq/L   Potassium 4.5  3.5 - 5.1 mEq/L   Chloride 99  96 - 112 mEq/L   CO2 32  19 - 32 mEq/L   Glucose, Bld 111 (*) 70 - 99 mg/dL   BUN 24 (*) 6 - 23 mg/dL  Creatinine, Ser 1.70 (*) 0.50 - 1.10 mg/dL   Calcium 8.9  8.4 - 10.5 mg/dL   Total Protein 6.5  6.0 - 8.3 g/dL   Albumin 3.2 (*) 3.5 - 5.2 g/dL   AST 11  0 - 37 U/L   ALT 16  0 - 35 U/L   Alkaline Phosphatase 58  39 - 117 U/L   Total Bilirubin 0.5  0.3 - 1.2 mg/dL   GFR calc non Af Amer 35 (*) >90 mL/min   GFR calc Af Amer 40 (*) >90 mL/min  URINE MICROSCOPIC-ADD ON      Result Value Range   Squamous Epithelial / LPF MANY (*) RARE   WBC, UA 7-10  <3 WBC/hpf   RBC / HPF 0-2  <3 RBC/hpf   Bacteria, UA FEW (*) RARE   Urine-Other MUCOUS PRESENT    CG4 I-STAT (LACTIC ACID)      Result Value Range   Lactic Acid, Venous 1.20  0.5 - 2.2 mmol/L   Dg Chest 2 View  08/08/2012   *RADIOLOGY REPORT*  Clinical Data: Leg swelling.  Dysuria.  CHEST - 2 VIEW  Comparison: 07/30/2012  Findings: Cardiomegaly observed with low lung volumes.  Pulmonary venous hypertension noted with mildly prominent upper zone vasculature.  No overt edema.  No pleural effusion noted.  Thoracic spondylosis is present.  IMPRESSION:  1.  Cardiomegaly with pulmonary venous hypertension.  No overt edema.   Original Report Authenticated By: Van Clines, M.D.   Dg Chest 2 View  07/29/2012    *RADIOLOGY REPORT*  Clinical Data: 47 year old female with shortness of breath and lower extremity swelling.  CHEST - 2 VIEW  Comparison: 07/15/2012 and earlier.  Findings: Upright AP and lateral views of the chest.  Larger lung volumes and less respiratory motion. Stable cardiomegaly and mediastinal contours.  No pleural effusion, pneumothorax or consolidation.  Pulmonary vascularity appears stable or mildly decreased.  There is trace fluid in the right minor fissure. Visualized tracheal air column is within normal limits.  No acute osseous abnormality identified.  IMPRESSION: Pulmonary vascular congestion with trace pleural fluid in the right minor fissure compatible with interstitial edema.  Stable cardiomegaly and no other acute cardiopulmonary abnormality.   Original Report Authenticated By: Roselyn Reef, M.D.   Dg Chest 2 View  07/16/2012   *RADIOLOGY REPORT*  Clinical Data: Shortness of breath  CHEST - 2 VIEW  Comparison: 06/29/2012  Findings: Hypoaeration.  Prominent cardiomediastinal contours, similar to prior. Interstitial prominence may be accentuated by patient body habitus/hypoaeration however edema not excluded.  No pleural effusion or pneumothorax.  No acute osseous finding.  IMPRESSION: Hypoaeration.  Prominent cardiomediastinal contours, similar to prior.  Mild edema not excluded.   Original Report Authenticated By: Carlos Levering, M.D.   Dg Chest Port 1 View  07/30/2012   *RADIOLOGY REPORT*  Clinical Data: Short of breath, effusion  PORTABLE CHEST - 1 VIEW  Comparison: Prior chest x-ray 07/29/2012  Findings: The patient is significantly rotated toward the right. Given rotation, grossly stable cardiac and mediastinal contours. Inspiratory volumes remain low and there is pulmonary vascular congestion.  No pneumothorax.  Query left layering pleural effusion.  IMPRESSION: 1.  Technically limited examination secondary to patient's positioning with significant rightward rotation 2.  Query small  left pleural effusion 3.  Otherwise, grossly stable chest x-ray   Original Report Authenticated By: Jacqulynn Cadet, M.D.      [place], PA-C 08/08/12 [phone]

## 2012-08-08 NOTE — ED Notes (Signed)
PIV attempts x 2, unsuccessful

## 2012-08-08 NOTE — ED Provider Notes (Signed)
Medical screening examination/treatment/procedure(s) were performed by non-physician practitioner and as supervising physician I was immediately available for consultation/collaboration.  Rhunette Croft, M.D.    Rhunette Croft, MD 08/08/12 [phone]

## 2012-08-08 NOTE — ED Notes (Signed)
Pt has multiple complaints. Pt c/o leg swelling, decreased and painful urination, and pain in legs.

## 2012-08-08 NOTE — ED Provider Notes (Signed)
History     CSN: [id number]  Arrival date & time 08/08/12  0406   First MD Initiated Contact with Patient 08/08/12 [phone]      Chief Complaint  Patient presents with  . Leg Swelling  . Dysuria    (Consider location/radiation/quality/duration/timing/severity/associated sxs/prior treatment) HPI Kara Vincent is a 47 y.o. female w a hx of morbid obesity, CHF, asthma & HLD presents to the Er c/o of SOB, leg swelling and dysuria. Associated symptoms include cough, but patient denies change in sputum production. Pt states that SOB is typical and not worsened in comparison of chronic difficulty breathing. She is on 3L [place] at home and CPAP at night, denies need for increased oxygen recently.  Patient states that she's been compliant on her Lasix taking 40 mg twice a day.  Patient denies any fevers, night sweats, chills, leg erythema, rash, chest pain, change in sputum production, hematuria, vaginal discharge , abdominal pain.  Past Medical History  Diagnosis Date  . Asthma   . GERD (gastroesophageal reflux disease)   . Hypertension   . Homelessness   . Low back pain   . 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 Arthritis   . Gout   . Hyperlipemia   . Morbid obesity     uses motor wheel chair  . MRSA (methicillin resistant Staphylococcus aureus)     states about a year ago    Past Surgical History  Procedure Laterality Date  . Tubal ligation    . Cesarean section      Family History  Problem Relation Age of Onset  . Asthma Mother   . Diabetes Father   . Heart disease Father   . Stroke Father     History  Substance Use Topics  . Smoking status: Former Smoker    Types: Cigarettes    Quit date: 03/25/2003  . Smokeless tobacco: Former Systems developer    Quit date: 05/27/1978  . Alcohol Use: No    OB History   Grav Para Term Preterm Abortions TAB SAB Ect Mult Living                  Review of Systems Ten systems reviewed and are negative for acute change, except  as noted in the HPI.    Allergies  Ibuprofen; Adhesive; Azithromycin; Doxycycline; Sulfa antibiotics; and Sulfamethoxazole w-trimethoprim  Home Medications   Current Outpatient Rx  Name  Route  Sig  Dispense  Refill  . albuterol (PROVENTIL HFA) 108 (90 BASE) MCG/ACT inhaler   Inhalation   Inhale 2 puffs into the lungs every 4 (four) hours as needed. For asthma relief         . albuterol (PROVENTIL) (2.5 MG/3ML) 0.083% nebulizer solution   Nebulization   Take 3 mLs (2.5 mg total) by nebulization every 6 (six) hours as needed for wheezing.   75 mL   0   . allopurinol (ZYLOPRIM) 100 MG tablet   Oral   Take 100 mg by mouth daily.         [name] amitriptyline (ELAVIL) 75 MG tablet   Oral   Take 1 tablet (75 mg total) by mouth at bedtime.   30 tablet   0   . ammonium lactate (AMLACTIN) 12 % cream   Topical   Apply 1 g topically as needed. Applies to back and legs.Marland KitchenMarland KitchenIn a thin layer on affected area for dry patches.         [name]  aspirin 325 MG tablet   Oral   Take 325 mg by mouth daily.         [name] atorvastatin (LIPITOR) 20 MG tablet   Oral   Take 20 mg by mouth daily.         . clonazePAM (KLONOPIN) 1 MG tablet   Oral   Take 1 mg by mouth 2 (two) times daily.         . cloNIDine (CATAPRES) 0.2 MG tablet   Oral   Take 0.2 mg by mouth 2 (two) times daily.         . diphenhydrAMINE (BENADRYL) 25 mg capsule   Oral   Take 25 mg by mouth every 6 (six) hours as needed. For itching         . Emollient (EUCERIN) lotion   Topical   Apply topically 2 (two) times daily as needed. On affected areas for dry patches.         . furosemide (LASIX) 40 MG tablet   Oral   Take 1 tablet (40 mg total) by mouth daily.   30 tablet      . guaifenesin (ROBITUSSIN) 100 MG/5ML syrup   Oral   Take 10 mLs (200 mg total) by mouth every 4 (four) hours as needed for cough.   120 mL   0   . HYDROcodone-acetaminophen (NORCO/VICODIN) 5-325 MG per tablet   Oral   Take 1-2 tablets  by mouth every 6 (six) hours as needed for pain.   5 tablet   0   . ipratropium (ATROVENT) 0.02 % nebulizer solution   Nebulization   Take 2.5 mLs (0.5 mg total) by nebulization every 4 (four) hours.   75 mL   1   . isosorbide mononitrate (IMDUR) 30 MG 24 hr tablet   Oral   Take 1 tablet (30 mg total) by mouth daily.   30 tablet   0   . ketoconazole (NIZORAL) 2 % cream   Topical   Apply topically daily.         [name] ketoconazole (NIZORAL) 2 % shampoo   Topical   Apply 1 application topically 2 (two) times a week.         [name] ketoconazole (NIZORAL) 200 MG tablet   Oral   Take 200 mg by mouth daily.         [name] lubiprostone (AMITIZA) 24 MCG capsule   Oral   Take 24 mcg by mouth every evening.          . naproxen (NAPROSYN) 250 MG tablet   Oral   Take 250 mg by mouth 2 (two) times daily with a meal.         . omega-3 acid ethyl esters (LOVAZA) 1 G capsule   Oral   Take 2 g by mouth 2 (two) times daily.         . pantoprazole (PROTONIX) 40 MG tablet   Oral   Take 40 mg by mouth daily.         . predniSONE (DELTASONE) 50 MG tablet   Oral   Take 1 tablet (50 mg total) by mouth daily. Starting tomorrow morning   4 tablet   0   . promethazine (PHENERGAN) 25 MG tablet   Oral   Take 25 mg by mouth every 6 (six) hours as needed. For nausea/vomiting         . solifenacin (VESICARE) 5 MG tablet   Oral   Take 5 mg by  mouth daily.          [name] VITAMIN E PO   Oral   Take 1 tablet by mouth daily.           BP 84/48  Pulse 78  Temp(Src) 98.2 F (36.8 C) (Oral)  Resp 16  SpO2 93%  LMP 03/24/2009  Physical Exam  Nursing note and vitals reviewed. Constitutional: She is oriented to person, place, and time. She appears well-developed and well-nourished. No distress.  Hypotensive  HENT:  Head: Normocephalic and atraumatic.  Eyes: Conjunctivae and EOM are normal.  Neck: Normal range of motion.  Soft supple with no nuchal rigidity   Cardiovascular:   Regular rate and rhythm.  Intact distal pulses.  No pitting edema.  Pulmonary/Chest: Effort normal.  Lungs clear auscultation bilaterally without crackles.  Abdominal:  Morbidly obese, abdomen soft without tenderness.  Musculoskeletal: Normal range of motion.  Neurological: She is alert and oriented to person, place, and time. Coordination normal.  Skin: Skin is warm and dry. No rash noted. She is not diaphoretic.  Psychiatric: She has a normal mood and affect. Her behavior is normal.    ED Course  Procedures (including critical care time)  Labs Reviewed  URINALYSIS, ROUTINE W REFLEX MICROSCOPIC - Abnormal; Notable for the following:    APPearance CLOUDY (*)    Specific Gravity, Urine 1.031 (*)    Bilirubin Urine SMALL (*)    Protein, ur 30 (*)    Leukocytes, UA SMALL (*)    All other components within normal limits  URINE MICROSCOPIC-ADD ON - Abnormal; Notable for the following:    Squamous Epithelial / LPF MANY (*)    Bacteria, UA FEW (*)    All other components within normal limits  URINE CULTURE  URINE CULTURE  CBC WITH DIFFERENTIAL  COMPREHENSIVE METABOLIC PANEL   No results found.   No diagnosis found.    MDM  47 year old female with multiple visits to the emergency department for her many comorbidities presents again today complaining of dysuria, shortness of breath and leg swelling.  Patient hypotensive on arrival.  D/t chief complaint concern for urosepsis, patient started on 2 g of IV Rocephin, fluid bolus and labs/imaging ordered.  Urine culture pending.  Pt is seen and examined. Disposition will be pending lab studies and reassessment, but anticipate admission based on hypotension and co-morbidities. Pt seen and discussed w attending who agrees w wk up thus far. Pt care resumed by oncoming provider.          Verl Dicker, [place] 08/08/12 [phone]

## 2012-08-08 NOTE — H&P (Signed)
[facility] History and Physical  Kara Vincent [id number] DOB: June 29, 1965 DOA: 08/08/2012  Referring physician: Ms. Raquel Sarna, ED PA PCP: Harvie Junior, MD  Specialists:  None   Chief Complaint: Dysuria and leg swelling.  HPI: Kara Vincent is a 47 y.o. female who lives at her home, with PMH of morbid obesity, OSA-noncompliant with CPAP, chronic respiratory failure on 3 L per minute oxygen, asthma, GERD, HTN, HL, chronic diastolic CHF, leukocytosis, stage III chronic kidney disease, frequent hospitalization (fifth admission in 6 months), recently discharged from the hospital on 08/01/2012, presented to the [facility] ED on 08/08/12 with complaints of leg swellings, dysuria, low blood pressure. Patient indicates that she has a blood pressure machine to check her blood pressures and noticed blood pressures in the 80s last night. She also complains of lower abdominal pain intermittently and dysuria. She denies fevers or chills. She has generalized aches and pains. She thinks that her legs are more swollen than usual but chronic dyspnea is unchanged. No chest pain or palpitations. No nausea, vomiting or diarrhea. She claims compliance to her medications but indicates that her urine output may have decreased. In the ED, patient was initially hypotensive 63/23 mm of mercury which improved after 2 L of IV fluid boluses to 110s in the ED. Creatinine 1.7, white blood cell 11 and UA shows few bacteria but lots of squamous cells. Chest x-ray without acute findings. Hospitalist admission requested.   Review of Systems: All systems reviewed and apart from history of presenting illness, are negative  Past Medical History  Diagnosis Date  . Asthma   . GERD (gastroesophageal reflux disease)   . Hypertension   . Homelessness   . Low back pain   . 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 Arthritis   . Gout   . Hyperlipemia   . Morbid obesity     uses motor wheel chair   . MRSA (methicillin resistant Staphylococcus aureus)     states about a year ago   Past Surgical History  Procedure Laterality Date  . Tubal ligation    . Cesarean section     Social History:  reports that she quit smoking about 9 years ago. Her smoking use included Cigarettes. She smoked 0.00 packs per day. She quit smokeless tobacco use about 34 years ago. She reports that she does not drink alcohol or use illicit drugs. Single. Lives at her home along with someone. Moves around with the help of an electric wheelchair.  Allergies  Allergen Reactions  . Ibuprofen Hives, Shortness Of Breath and Palpitations  . Adhesive (Tape) Rash  . Azithromycin Hives and Rash  . Doxycycline Rash  . Sulfa Antibiotics Rash  . Sulfamethoxazole W-Trimethoprim Rash    Family History  Problem Relation Age of Onset  . Asthma Mother   . Diabetes Father   . Heart disease Father   . Stroke Father     Prior to Admission medications   Medication Sig Start Date End Date Taking? Authorizing Provider  albuterol (PROVENTIL HFA) 108 (90 BASE) MCG/ACT inhaler Inhale 2 puffs into the lungs every 4 (four) hours as needed for wheezing or shortness of breath. For asthma relief   Yes Historical Provider, MD  albuterol (PROVENTIL) (2.5 MG/3ML) 0.083% nebulizer solution Take 3 mLs (2.5 mg total) by nebulization every 6 (six) hours as needed for wheezing. 08/01/12 09/21/13 Yes Annita Brod, MD  allopurinol (ZYLOPRIM) 100 MG tablet Take 100 mg by  mouth every morning.    Yes Historical Provider, MD  amitriptyline (ELAVIL) 75 MG tablet Take 1 tablet (75 mg total) by mouth at bedtime. 03/23/12  Yes Marianne L York, PA-C  ammonium lactate (AMLACTIN) 12 % cream Apply 1 g topically daily as needed. Applies to back and legs.Marland KitchenMarland KitchenIn a thin layer on affected area for dry patches.   Yes Historical Provider, MD  aspirin 325 MG tablet Take 325 mg by mouth every evening.    Yes Historical Provider, MD  atorvastatin (LIPITOR) 20 MG  tablet Take 20 mg by mouth every evening.    Yes Historical Provider, MD  clonazePAM (KLONOPIN) 1 MG tablet Take 1 mg by mouth 2 (two) times daily.   Yes Historical Provider, MD  cloNIDine (CATAPRES) 0.2 MG tablet Take 0.2 mg by mouth 2 (two) times daily.   Yes Historical Provider, MD  diphenhydrAMINE (BENADRYL) 25 mg capsule Take 25 mg by mouth every 6 (six) hours as needed for itching or allergies. For itching   Yes Historical Provider, MD  Emollient (EUCERIN) lotion Apply topically 2 (two) times daily as needed. On affected areas for dry patches.   Yes Historical Provider, MD  furosemide (LASIX) 40 MG tablet Take 40 mg by mouth every morning. 01/01/12  Yes Charlynne Cousins, MD  guaifenesin (ROBITUSSIN) 100 MG/5ML syrup Take 200 mg by mouth every 4 (four) hours as needed for cough. 08/01/12  Yes Annita Brod, MD  HYDROcodone-acetaminophen (NORCO/VICODIN) 5-325 MG per tablet Take 1-2 tablets by mouth every 6 (six) hours as needed for pain. 08/01/12  Yes Annita Brod, MD  ipratropium (ATROVENT) 0.02 % nebulizer solution Take 2.5 mLs (0.5 mg total) by nebulization every 4 (four) hours. 03/23/12  Yes Bobby Rumpf York, PA-C  isosorbide mononitrate (IMDUR) 30 MG 24 hr tablet Take 30 mg by mouth every morning. 08/01/12  Yes Annita Brod, MD  ketoconazole (NIZORAL) 2 % cream Apply 1 application topically daily.    Yes Historical Provider, MD  ketoconazole (NIZORAL) 2 % shampoo Apply 1 application topically 2 (two) times a week.   Yes Historical Provider, MD  ketoconazole (NIZORAL) 200 MG tablet Take 200 mg by mouth every morning.    Yes Historical Provider, MD  lisinopril (PRINIVIL,ZESTRIL) 40 MG tablet Take 40 mg by mouth 2 (two) times daily.   Yes Historical Provider, MD  lubiprostone (AMITIZA) 24 MCG capsule Take 24 mcg by mouth daily as needed for constipation.    Yes Historical Provider, MD  naproxen (NAPROSYN) 250 MG tablet Take 250 mg by mouth 2 (two) times daily as needed (for pain).     Yes Historical Provider, MD  omega-3 acid ethyl esters (LOVAZA) 1 G capsule Take 2 g by mouth 2 (two) times daily.   Yes Historical Provider, MD  pantoprazole (PROTONIX) 40 MG tablet Take 40 mg by mouth every morning.    Yes Historical Provider, MD  promethazine (PHENERGAN) 25 MG tablet Take 25 mg by mouth every 6 (six) hours as needed. For nausea/vomiting   Yes Historical Provider, MD  solifenacin (VESICARE) 5 MG tablet Take 5 mg by mouth every morning.    Yes Historical Provider, MD  vitamin E 100 UNIT capsule Take 100 Units by mouth every morning.   Yes Historical Provider, MD   Physical Exam: Filed Vitals:   08/08/12 0930 08/08/12 0945 08/08/12 1000 08/08/12 1048  BP: 104/58 114/70 107/87 95/58  Pulse: 70 76 72 79  Temp:    98.2 F (36.8 C)  TempSrc:    Oral  Resp:      SpO2: 97% 95% 98% 96%     General exam: Moderately built and morbidly obese female patient, lying comfortably supine on the gurney in no obvious distress.  Head, eyes and ENT: Nontraumatic and normocephalic. Pupils equally reacting to light and accommodation. Oral mucosa slightly dry.  Neck: Supple. No JVD, carotid bruit or thyromegaly.  Lymphatics: No lymphadenopathy.  Respiratory system: Clear to auscultation. No increased work of breathing.  Cardiovascular system: S1 and S2 heard, RRR. No JVD, murmurs, gallops, clicks or pedal edema.  Gastrointestinal system: Abdomen is obese, soft and nontender. Normal bowel sounds heard. No organomegaly or masses appreciated.  Central nervous system: Alert and oriented. No focal neurological deficits.  Extremities: Symmetric 5 x 5 power. Peripheral pulses symmetrically felt.  Skin: No acute findings. Multiple healing scratch marks.  Musculoskeletal system: Negative exam.  Psychiatry: Pleasant and cooperative.   Labs on Admission:  Basic Metabolic Panel:  Recent Labs Lab 08/08/12 0600  NA 137  K 4.5  CL 99  CO2 32  GLUCOSE 111*  BUN 24*  CREATININE  1.70*  CALCIUM 8.9   Liver Function Tests:  Recent Labs Lab 08/08/12 0600  AST 11  ALT 16  ALKPHOS 58  BILITOT 0.5  PROT 6.5  ALBUMIN 3.2*   No results found for this basename: LIPASE, AMYLASE,  in the last 168 hours No results found for this basename: AMMONIA,  in the last 168 hours CBC:  Recent Labs Lab 08/08/12 0600  WBC 11.4*  NEUTROABS 7.0  HGB 12.0  HCT 39.7  MCV 92.8  PLT 310   Cardiac Enzymes: No results found for this basename: CKTOTAL, CKMB, CKMBINDEX, TROPONINI,  in the last 168 hours  BNP (last 3 results)  Recent Labs  03/23/12 0007 05/09/12 0150 07/29/12 0210  PROBNP 133.5* 168.6* 492.0*   CBG: No results found for this basename: GLUCAP,  in the last 168 hours  Radiological Exams on Admission: Dg Chest 2 View  08/08/2012   *RADIOLOGY REPORT*  Clinical Data: Leg swelling.  Dysuria.  CHEST - 2 VIEW  Comparison: 07/30/2012  Findings: Cardiomegaly observed with low lung volumes.  Pulmonary venous hypertension noted with mildly prominent upper zone vasculature.  No overt edema.  No pleural effusion noted.  Thoracic spondylosis is present.  IMPRESSION:  1.  Cardiomegaly with pulmonary venous hypertension.  No overt edema.   Original Report Authenticated By: Van Clines, M.D.    Assessment/Plan Principal Problem:   UTI (lower urinary tract infection) Active Problems:   OBSTRUCTIVE SLEEP APNEA   HYPERTENSION   ASTHMA   GERD   Dehydration   Chronic pain   Morbid obesity   Leukocytosis   Hypotension   CKD (chronic kidney disease), stage III   Diastolic CHF, chronic   1. Possible UTI: Continue IV Rocephin pending urine culture results. Does not look septic or toxic. 2. Hypotension: Possibly secondary to intravascular volume depletion from diuretics and from antihypertensive medications. Improved after IV fluid boluses in the ED. Will hold off on any further IV fluids given history of CHF. Hold antihypertensives and diuretics today. Monitor  closely. 3. Chronic diastolic CHF: Compensated. No overt extremity edema. Echo 05/09/12: LVEF 55-60% with mild to moderate LVH. Resume Lasix from 5/19. 4. Mild acute on possible stage III chronic kidney disease: Baseline creatinine not clear. This may be secondary to intravascular was depletion, diuretics and ACE inhibitors. Tentatively hold medications. Status post IV fluids. Followup BMP in  a.m. 5. Chronic respiratory failure: Multifactorial-morbid obesity, OSA, asthma and chronic diastolic CHF. Oxygen dependent at 3 L per minute. Stable. Continue nightly CPAP. 6. GERD: Continue PPI.     Code Status: Full  Family Communication: Discussed with patient  Disposition Plan: Home when stable-possibly 5/19   Time spent: 60 minutes  [facility] Pager [phone]  If 7PM-7AM, please contact night-coverage [email or web address] Password [facility] 08/08/2012, 10:54 AM

## 2012-08-09 LAB — BASIC METABOLIC PANEL
Chloride: 98 mEq/L (ref 96–112)
GFR calc non Af Amer: 27 mL/min — ABNORMAL LOW (ref >90)
Glucose, Bld: 107 mg/dL — ABNORMAL HIGH (ref 70–99)
Potassium: 4.8 mEq/L (ref 3.5–5.1)
Sodium: 134 mEq/L — ABNORMAL LOW (ref 135–145)

## 2012-08-09 LAB — URINE CULTURE

## 2012-08-09 MED ORDER — FUROSEMIDE 40 MG PO TABS
40.0000 mg | ORAL_TABLET | Freq: Every morning | ORAL | Status: DC
  Administered 2012-08-10 – 2012-08-11 (×2): 40 mg via ORAL
  Filled 2012-08-09 (×2): qty 1

## 2012-08-09 MED ORDER — ALBUTEROL SULFATE (5 MG/ML) 0.5% IN NEBU
2.5000 mg | INHALATION_SOLUTION | Freq: Three times a day (TID) | RESPIRATORY_TRACT | Status: DC
  Administered 2012-08-09 – 2012-08-11 (×6): 2.5 mg via RESPIRATORY_TRACT
  Filled 2012-08-09 (×7): qty 0.5

## 2012-08-09 MED ORDER — SODIUM CHLORIDE 0.9 % IV BOLUS (SEPSIS)
500.0000 mL | Freq: Once | INTRAVENOUS | Status: AC
  Administered 2012-08-09: 500 mL via INTRAVENOUS

## 2012-08-09 MED ORDER — ISOSORBIDE MONONITRATE ER 30 MG PO TB24
30.0000 mg | ORAL_TABLET | Freq: Every morning | ORAL | Status: DC
  Administered 2012-08-10 – 2012-08-11 (×2): 30 mg via ORAL
  Filled 2012-08-09 (×2): qty 1

## 2012-08-09 MED ORDER — IPRATROPIUM BROMIDE 0.02 % IN SOLN
0.5000 mg | Freq: Three times a day (TID) | RESPIRATORY_TRACT | Status: DC
  Administered 2012-08-09 – 2012-08-11 (×6): 0.5 mg via RESPIRATORY_TRACT
  Filled 2012-08-09 (×7): qty 2.5

## 2012-08-09 NOTE — Care Management Note (Addendum)
    Page 1 of 2   08/12/2012     2:15:41 PM   CARE MANAGEMENT NOTE 08/12/2012  Patient:  Kara Vincent, Kara Vincent   Account Number:  [id number]  Date Initiated:  08/09/2012  Documentation initiated by:  Dessa Phi  Subjective/Objective Assessment:   ADMITTED W/DYSURIA.UTI.RECENT ADMISSION-5/7-5/11/14.CO:[id number] OBESITY,OSA-CPAP,ASTHMA,MRSA.     Action/Plan:   FROM HOME,HAS A ROOMMATE(CURRENTLY HOSPITALIZED),HAS HOME 02,CPAP-[facility].HAS HOVER AROUND,CANE.HAS PCP,PHARMACY.TRANSP VIA SOCIAL SERVICE/[facility] BUS.   Anticipated DC Date:  08/11/2012   Anticipated DC Plan:  [place]  CM consult      Choice offered to / List presented to:  C-1 Patient        [place] arranged  HH-1 RN  [place].   Status of service:  Completed, signed off Medicare Important Message given?   (If response is "NO", the following Medicare IM given date fields will be blank) Date Medicare IM given:   Date Additional Medicare IM given:    Discharge Disposition:  [place]  Per UR Regulation:  Reviewed for med. necessity/level of care/duration of stay  If discussed at [place] of Stay Meetings, dates discussed:    Comments:  08/12/12 Ayde Record RN,BSN NCM [place] REP [place].PAGED MD,HH ORDERS PLACED,PATIENT HAD ALREADY BEEN USING HOME 02,THEREFORE NO NEW ORDERS NEEDED,SINCE NO CHANGES.[facility] DME REP HAS CONTINUED TO ATTEMPT TO REACH PATIENT @ HOME TO ADDRESS MALFUNCTION OF HOME 02,& CPAP,BUT AGAIN PATIENT IS NOT @ HOME,OR DOES NOT RETURN CALL SO [place] DME CAN APPROPRIATELY F/U.  08/11/12 Jamielee Mchale RN,BSN NCM [place].PATIENT HAS RECEIVED GRABER FREE FROM OT.  08/09/12 Alven Alverio RN,BSN NCM [phone] INFORMED PATIENT THAT GRAPPER,REACHER IS IN THE "HIP KIT"& CAN BE PURCHASED IN THE GIFT SHOP FOR $26.69  W/TAX.PATIENT STATES SHE IS UNABLE TO AFFORD IT,EXPLAINED THAT MAYBE SHE CAN BORROW FROM FAMILY OR FRIENDS.RECOMMEND PT/OT CONS TO ASSESS DME NEEDS?3N1. PATIENT STATES HER HOME 02,& CPAP NON FUNCTIONING ISSURES HAS NOT BEEN RESOLVED BY [facility] SINCE SHE WAS JUST D/C,& READMITTED IN SUCH A SHORT TIME.TC [facility] DME REP [place] OF THESE ISSUES,& WILL F/U WITH PATIENT [place].PATIENT STATES SHE AMBULATES W/CANE @ HOME BUT LEGS GIVE OUT FREQUENTLY.RECOMMEND PT/OT EVAL.[facility] CHOSEN IF HH NEEDED.RECOMMEND HHRN-HOME SAFETY EVAL.SINCE HAS MEDICAID-HHPT MAY NOT BE COVERED.[facility] REP [place].

## 2012-08-09 NOTE — Progress Notes (Signed)
[facility] PROGRESS NOTE  Kara Vincent [id number] DOB: 1966/01/01 DOA: 08/08/2012 PCP: Harvie Junior, MD  Brief narrative 47 y.o. female who lives at her home, with PMH of morbid obesity, OSA-noncompliant with CPAP, chronic respiratory failure on 3 L per minute oxygen, asthma, GERD, HTN, HL, chronic pain, chronic diastolic CHF, leukocytosis, stage III chronic kidney disease, frequent hospitalization (fifth admission in 6 months), recently discharged from the hospital on 08/01/2012, presented to the [facility] ED on 08/08/12 with complaints of leg swellings, dysuria and low blood pressure. In the ED, patient was initially hypotensive 63/23 mmHg which improved after 2 L of IV fluid boluses to 110s in the ED. Creatinine 1.7, white blood cell 11 and UA shows few bacteria but lots of squamous cells. Chest x-ray without acute findings. Hospitalist admission requested   Assessment/Plan: 1. Possible UTI: Continue IV Rocephin pending urine culture results. Does not look septic or toxic. 2. Hypotension: Possibly secondary to intravascular volume depletion from diuretics and from antihypertensive medications. Improved after IV fluid boluses in the ED. Hold antihypertensives and diuretics today. Blood pressures in the 80s this morning-brief  IV fluids and monitor closely. 3. Chronic diastolic CHF: Compensated-actually appears intravascular volume depleted. No overt extremity edema. Echo 05/09/12: LVEF 55-60% with mild to moderate LVH. Hold Lasix for now. 4. Mild acute on possible stage III chronic kidney disease: Baseline creatinine not clear. This may be secondary to intravascular was depletion, diuretics and ACE inhibitors. Creatinine has slightly increased from 1.7 on admission to 2.1-possibly from intravascular volume depletion, ARB and hypotension. Hold antihypertensives and diuretics. Brief IV fluids and follow BMP in a.m. Urine output seems low. 5. Chronic respiratory failure:  Multifactorial-morbid obesity, OSA, asthma and chronic diastolic CHF. Oxygen dependent at 3 L per minute. Stable. Continue nightly CPAP. 6. GERD: Continue PPI.   Code Status: Full Family Communication: Discussed with patient. Disposition Plan: Remains in observation. Discharged home when stable.   Consultants:  None  Procedures:  None  Antibiotics:    IV Rocephin 5/18 >  HPI/Subjective:  Headache. Leg swelling. No worsening dyspnea.  Objective: Filed Vitals:   08/08/12 2007 08/08/12 2153 08/09/12 0511 08/09/12 0819  BP:  91/50 87/57   Pulse:  73 79   Temp:  98.3 F (36.8 C) 98.7 F (37.1 C)   TempSrc:  Oral    Resp:  18 20   Height:      Weight:   187.13 kg (412 lb 8.8 oz)   SpO2: 91% 97% 97% 96%    Intake/Output Summary (Last 24 hours) at 08/09/12 1038 Last data filed at 08/09/12 1035  Gross per 24 hour  Intake 1948.75 ml  Output    575 ml  Net 1373.75 ml   Filed Weights   08/08/12 1100 08/09/12 0511  Weight: 187.063 kg (412 lb 6.4 oz) 187.13 kg (412 lb 8.8 oz)    Exam:   General exam: Morbidly obese female lying comfortably supine in bed.  Respiratory system: Clear. No increased work of breathing.  Cardiovascular system: S1 & S2 heard, RRR. No JVD, murmurs, gallops, clicks or pedal edema. Telemetry: Sinus rhythm with occasional PVCs.  Gastrointestinal system: Abdomen is obese/nondistended, soft and nontender. Normal bowel sounds heard.  Central nervous system: Alert and oriented. No focal neurological deficits.  Extremities: Symmetric 5 x 5 power.   Data Reviewed: Basic Metabolic Panel:  Recent Labs Lab 08/08/12 0600 08/09/12 0500  NA 137 134*  K 4.5 4.8  CL 99 98  CO2 32  30  GLUCOSE 111* 107*  BUN 24* 34*  CREATININE 1.70* 2.10*  CALCIUM 8.9 8.6   Liver Function Tests:  Recent Labs Lab 08/08/12 0600  AST 11  ALT 16  ALKPHOS 58  BILITOT 0.5  PROT 6.5  ALBUMIN 3.2*   No results found for this basename: LIPASE, AMYLASE,   in the last 168 hours No results found for this basename: AMMONIA,  in the last 168 hours CBC:  Recent Labs Lab 08/08/12 0600  WBC 11.4*  NEUTROABS 7.0  HGB 12.0  HCT 39.7  MCV 92.8  PLT 310   Cardiac Enzymes: No results found for this basename: CKTOTAL, CKMB, CKMBINDEX, TROPONINI,  in the last 168 hours BNP (last 3 results)  Recent Labs  03/23/12 0007 05/09/12 0150 07/29/12 0210  PROBNP 133.5* 168.6* 492.0*   CBG: No results found for this basename: GLUCAP,  in the last 168 hours  No results found for this or any previous visit (from the past 240 hour(s)).   Studies: Dg Chest 2 View  08/08/2012   *RADIOLOGY REPORT*  Clinical Data: Leg swelling.  Dysuria.  CHEST - 2 VIEW  Comparison: 07/30/2012  Findings: Cardiomegaly observed with low lung volumes.  Pulmonary venous hypertension noted with mildly prominent upper zone vasculature.  No overt edema.  No pleural effusion noted.  Thoracic spondylosis is present.  IMPRESSION:  1.  Cardiomegaly with pulmonary venous hypertension.  No overt edema.   Original Report Authenticated By: Van Clines, M.D.     Additional labs:   Scheduled Meds: . albuterol  2.5 mg Nebulization TID  . allopurinol  100 mg Oral q morning - 10a  . amitriptyline  75 mg Oral QHS  . aspirin  325 mg Oral QPM  . atorvastatin  20 mg Oral QPM  . cefTRIAXone (ROCEPHIN)  IV  1 g Intravenous Q24H  . clonazePAM  1 mg Oral BID  . darifenacin  7.5 mg Oral Daily  . [START ON 08/10/2012] furosemide  40 mg Oral q morning - 10a  . heparin  5,000 Units Subcutaneous Q8H  . ipratropium  0.5 mg Nebulization TID  . [START ON 08/10/2012] isosorbide mononitrate  30 mg Oral q morning - 10a  . pantoprazole  40 mg Oral q morning - 10a  . sodium chloride  500 mL Intravenous Once  . sodium chloride  3 mL Intravenous Q12H   Continuous Infusions:   Principal Problem:   UTI (lower urinary tract infection) Active Problems:   OBSTRUCTIVE SLEEP APNEA   HYPERTENSION    ASTHMA   GERD   Dehydration   Chronic pain   Morbid obesity   Leukocytosis   Hypotension   Diastolic CHF, chronic   Renal failure, acute on chronic stage 3    Time spent: 35 minutes    [place] [facility] Pager [phone].   If 8PM-8AM, please contact night-coverage at [email or web address], password [facility] 08/09/2012, 10:38 AM  LOS: 1 day

## 2012-08-09 NOTE — Progress Notes (Signed)
Patient has been complaining of CPAP and O2 not functioning properly at home.  [facility] has made several attempts to reach patient via phone and drive by, however, patient is either not home, or will not answer the phone.  Last appt 06/24/12, she was told to call back when she was ready for [place] to come out, and we never rec'd a call from her.  I have placed another request for a CPAP and O2 check with the office.  Will continue to follow.

## 2012-08-09 NOTE — Progress Notes (Signed)
Pt placed on cpap auto titration mode 6-20 cmH2O with 3L O2. Pt tolerating well.  Kathie Dike RRT

## 2012-08-10 LAB — BASIC METABOLIC PANEL
BUN: 26 mg/dL — ABNORMAL HIGH (ref 6–23)
CO2: 31 mEq/L (ref 19–32)
Chloride: 99 mEq/L (ref 96–112)
GFR calc Af Amer: 43 mL/min — ABNORMAL LOW (ref >90)
Potassium: 5.3 mEq/L — ABNORMAL HIGH (ref 3.5–5.1)

## 2012-08-10 MED ORDER — KETOCONAZOLE 2 % EX CREA
TOPICAL_CREAM | Freq: Every day | CUTANEOUS | Status: DC
  Administered 2012-08-10 – 2012-08-11 (×2): via TOPICAL
  Filled 2012-08-10: qty 15

## 2012-08-10 MED ORDER — GUAIFENESIN-DM 100-10 MG/5ML PO SYRP
5.0000 mL | ORAL_SOLUTION | Freq: Once | ORAL | Status: AC
  Administered 2012-08-10: 5 mL via ORAL
  Filled 2012-08-10: qty 10

## 2012-08-10 MED ORDER — SODIUM POLYSTYRENE SULFONATE 15 GM/60ML PO SUSP
30.0000 g | Freq: Once | ORAL | Status: AC
  Administered 2012-08-10: 30 g via ORAL
  Filled 2012-08-10: qty 120

## 2012-08-10 MED ORDER — HYDROCERIN EX CREA
TOPICAL_CREAM | Freq: Two times a day (BID) | CUTANEOUS | Status: DC | PRN
  Administered 2012-08-10: 11:00:00 via TOPICAL
  Filled 2012-08-10: qty 113

## 2012-08-10 NOTE — Progress Notes (Signed)
[facility] PROGRESS NOTE  Kara Vincent [id number] DOB: 02-Feb-1966 DOA: 08/08/2012 PCP: Harvie Junior, MD  Brief narrative 47 y.o. female who lives at her home, with PMH of morbid obesity, OSA-noncompliant with CPAP, chronic respiratory failure on 3 L per minute oxygen, asthma, GERD, HTN, HL, chronic pain, chronic diastolic CHF, leukocytosis, stage III chronic kidney disease, frequent hospitalization (fifth admission in 6 months), recently discharged from the hospital on 08/01/2012, presented to the [facility] ED on 08/08/12 with complaints of leg swellings, dysuria and low blood pressure. In the ED, patient was initially hypotensive 63/23 mmHg which improved after 2 L of IV fluid boluses to 110s in the ED. Creatinine 1.7, white blood cell 11 and UA shows few bacteria but lots of squamous cells. Chest x-ray without acute findings. Hospitalist admission requested   Assessment/Plan: 1. Possible UTI: Urine culture appears contaminated. Completed 3 days of IV Rocephin-we'll discontinue. 2. Hyperkalemia: Unclear etiology. Kayexalate and repeat BMP in a.m. 3. Hypotension: Possibly secondary to intravascular volume depletion from diuretics and from antihypertensive medications. Improved after IV fluid boluses. Resumed Lasix but hold antihypertensives. 4. Chronic diastolic CHF: Compensated-actually appears intravascular volume depleted. No overt extremity edema. Echo 05/09/12: LVEF 55-60% with mild to moderate LVH. Resumed Lasix. 5. Mild acute on possible stage III chronic kidney disease: Baseline creatinine not clear. This may be secondary to intravascular was depletion, diuretics and ACE inhibitors. Creatinine has slightly increased from 1.7 on admission to 2.1-possibly from intravascular volume depletion, ARB and hypotension. Hold antihypertensives and diuretics. Improving. Improved urine output.  6. Chronic respiratory failure: Multifactorial-morbid obesity, OSA, asthma and chronic  diastolic CHF. Oxygen dependent at 3 L per minute. Stable. Continue nightly CPAP. 7. GERD: Continue PPI.   Code Status: Full Family Communication: Discussed with patient. Disposition Plan: Possible discharge 5/21. Has been on inpatient status since 5/20.   Consultants:  None  Procedures:  None  Antibiotics:    IV Rocephin 5/18 >  HPI/Subjective: ? Dyspnea. Some numbness in the legs. No other complaints.  Objective: Filed Vitals:   08/10/12 0530 08/10/12 0624 08/10/12 0815 08/10/12 1351  BP: 116/60 92/53  136/70  Pulse: 75   82  Temp: 98 F (36.7 C)   98.1 F (36.7 C)  TempSrc: Oral   Oral  Resp: 18   16  Height:      Weight: 187.018 kg (412 lb 4.8 oz)     SpO2: 91%  95% 95%    Intake/Output Summary (Last 24 hours) at 08/10/12 1827 Last data filed at 08/10/12 1720  Gross per 24 hour  Intake    530 ml  Output   4000 ml  Net  -3470 ml   Filed Weights   08/08/12 1100 08/09/12 0511 08/10/12 0530  Weight: 187.063 kg (412 lb 6.4 oz) 187.13 kg (412 lb 8.8 oz) 187.018 kg (412 lb 4.8 oz)    Exam:   General exam: Morbidly obese female lying comfortably supine in bed.  Respiratory system: Clear. No increased work of breathing.  Cardiovascular system: S1 & S2 heard, RRR. No JVD, murmurs, gallops, clicks or pedal edema. Telemetry: Sinus rhythm with occasional PVCs.  Gastrointestinal system: Abdomen is obese/nondistended, soft and nontender. Normal bowel sounds heard.  Central nervous system: Alert and oriented. No focal neurological deficits.  Extremities: Symmetric 5 x 5 power.   Data Reviewed: Basic Metabolic Panel:  Recent Labs Lab 08/08/12 0600 08/09/12 0500 08/10/12 0457  NA 137 134* 136  K 4.5 4.8 5.3*  CL 99  98 99  CO2 32 30 31  GLUCOSE 111* 107* 102*  BUN 24* 34* 26*  CREATININE 1.70* 2.10* 1.59*  CALCIUM 8.9 8.6 8.7   Liver Function Tests:  Recent Labs Lab 08/08/12 0600  AST 11  ALT 16  ALKPHOS 58  BILITOT 0.5  PROT 6.5  ALBUMIN  3.2*   No results found for this basename: LIPASE, AMYLASE,  in the last 168 hours No results found for this basename: AMMONIA,  in the last 168 hours CBC:  Recent Labs Lab 08/08/12 0600  WBC 11.4*  NEUTROABS 7.0  HGB 12.0  HCT 39.7  MCV 92.8  PLT 310   Cardiac Enzymes: No results found for this basename: CKTOTAL, CKMB, CKMBINDEX, TROPONINI,  in the last 168 hours BNP (last 3 results)  Recent Labs  03/23/12 0007 05/09/12 0150 07/29/12 0210  PROBNP 133.5* 168.6* 492.0*   CBG: No results found for this basename: GLUCAP,  in the last 168 hours  Recent Results (from the past 240 hour(s))  URINE CULTURE     Status: None   Collection Time    08/08/12  4:49 AM      Result Value Range Status   Specimen Description URINE, CATHETERIZED   Final   Special Requests NONE   Final   Culture  Setup Time 08/08/2012 13:29   Final   Colony Count 20,OOO COLONIES/ML   Final   Culture     Final   Value: Multiple bacterial morphotypes present, none predominant. Suggest appropriate recollection if clinically indicated.   Report Status 08/09/2012 FINAL   Final     Studies: No results found.   Additional labs:   Scheduled Meds: . albuterol  2.5 mg Nebulization TID  . allopurinol  100 mg Oral q morning - 10a  . amitriptyline  75 mg Oral QHS  . aspirin  325 mg Oral QPM  . atorvastatin  20 mg Oral QPM  . clonazePAM  1 mg Oral BID  . darifenacin  7.5 mg Oral Daily  . furosemide  40 mg Oral q morning - 10a  . heparin  5,000 Units Subcutaneous Q8H  . ipratropium  0.5 mg Nebulization TID  . isosorbide mononitrate  30 mg Oral q morning - 10a  . ketoconazole   Topical Daily  . pantoprazole  40 mg Oral q morning - 10a  . sodium chloride  3 mL Intravenous Q12H   Continuous Infusions:   Principal Problem:   UTI (lower urinary tract infection) Active Problems:   OBSTRUCTIVE SLEEP APNEA   HYPERTENSION   ASTHMA   GERD   Dehydration   Chronic pain   Morbid obesity   Leukocytosis    Hypotension   Diastolic CHF, chronic   Renal failure, acute on chronic stage 3    Time spent: 25 minutes    [facility] [facility] Pager [phone].   If 8PM-8AM, please contact night-coverage at [email or web address], password [facility] 08/10/2012, 6:27 PM  LOS: 2 days

## 2012-08-11 LAB — BASIC METABOLIC PANEL
CO2: 36 mEq/L — ABNORMAL HIGH (ref 19–32)
Chloride: 94 mEq/L — ABNORMAL LOW (ref 96–112)
Creatinine, Ser: 1.18 mg/dL — ABNORMAL HIGH (ref 0.50–1.10)
Sodium: 135 mEq/L (ref 135–145)

## 2012-08-11 NOTE — Discharge Summary (Signed)
Physician Discharge Summary  Kara Vincent [id number] DOB: March 17, 1966 DOA: 08/08/2012  PCP: Harvie Junior, MD  Admit date: 08/08/2012 Discharge date: 08/11/2012  Time spent: Greater than 30 minutes  Recommendations for Outpatient Follow-up:  1. Dr. York Ram, PCP in 5 days with repeat labs (BMP). 2. Lisinopril has been held secondary to recent acute renal failure. Reassess during followup. 3. [place].  Discharge Diagnoses:  Principal Problem:   UTI (lower urinary tract infection) Active Problems:   OBSTRUCTIVE SLEEP APNEA   HYPERTENSION   ASTHMA   GERD   Dehydration   Chronic pain   Morbid obesity   Leukocytosis   Hyperkalemia   Hypotension   Diastolic CHF, chronic   Renal failure, acute on chronic stage 3   Discharge Condition: Improved & Stable  Diet recommendation: Heart healthy diet.  Filed Weights   08/09/12 0511 08/10/12 0530 08/11/12 0508  Weight: 187.13 kg (412 lb 8.8 oz) 187.018 kg (412 lb 4.8 oz) 186.701 kg (411 lb 9.6 oz)    History of present illness:  47 y.o. female who lives at her home, with PMH of morbid obesity, OSA-noncompliant with CPAP, chronic respiratory failure on 3 L per minute oxygen, asthma, GERD, HTN, HL, chronic pain, chronic diastolic CHF, leukocytosis, stage III chronic kidney disease, frequent hospitalization (fifth admission in 6 months), recently discharged from the hospital on 08/01/2012, presented to the [facility] ED on 08/08/12 with complaints of leg swellings, dysuria and low blood pressure. In the ED, patient was initially hypotensive 63/23 mmHg which improved after 2 L of IV fluid boluses to 110s in the ED. Creatinine 1.7, white blood cell 11 and UA shows few bacteria but lots of squamous cells. Chest x-ray without acute findings. Hospitalist admission requested  Hospital Course:  1. Possible UTI: Urine culture appears contaminated. Completed 3 days of IV Rocephin & discontinued. 2. Hyperkalemia:  Unclear etiology. This developed 2 days after hospitalization despite improving creatinine and not being on ACEI/ARB's (had been held since admission). Resolved after a dose of Kayexalate. Lisinopril will be held until followup with PCP in 5 days with repeat BMP. 3. Hypotension: Possibly secondary to intravascular volume depletion from diuretics and from antihypertensive medications. Improved after IV fluid boluses. Resume clonidine at discharge but hold lisinopril secondary to recent acute renal failure. 4. Chronic diastolic CHF: Compensated-actually appeared intravascular volume depleted on admission. No overt extremity edema. Echo 05/09/12: LVEF 55-60% with mild to moderate LVH. Resumed Lasix. Compensated. 5. Mild acute on possible stage III chronic kidney disease: Baseline creatinine not clear. This may be secondary to intravascular was depletion, diuretics and ACE inhibitors. Temporarily held antihypertensives?ACEI and diuretics. Resolved. Resumed Lasix and Clonidine at discharge. Hold Lisinopril until OP follow up with PCP. 6. Chronic respiratory failure: Multifactorial-morbid obesity, OSA, asthma and chronic diastolic CHF. Oxygen dependent at 3 L per minute. Stable. Continue nightly CPAP. 7. GERD: Continue PPI.   Procedures:  None   Consultations:  None  Discharge Exam:  Complaints: Chronic pain at multiple sites. Denies worsening dyspnea.  Filed Vitals:   08/10/12 0815 08/10/12 1351 08/11/12 0508 08/11/12 0832  BP:  136/70 122/74   Pulse:  82 76   Temp:  98.1 F (36.7 C) 98 F (36.7 C)   TempSrc:  Oral Oral   Resp:  16 19   Height:      Weight:   186.701 kg (411 lb 9.6 oz)   SpO2: 95% 95% 98% 93%     General exam:  Morbidly obese female lying comfortably supine in bed.   Respiratory system: Clear. No increased work of breathing.   Cardiovascular system: S1 & S2 heard, RRR. No JVD, murmurs, gallops, clicks or pedal edema. Telemetry: Sinus rhythm.   Gastrointestinal  system: Abdomen is obese/nondistended, soft and nontender. Normal bowel sounds heard.   Central nervous system: Alert and oriented. No focal neurological deficits.   Extremities: Symmetric 5 x 5 power.   Discharge Instructions      Discharge Orders   Future Orders Complete By Expires     ([place]) Call MD:  Anytime you have any of the following symptoms: 1) 3 pound weight gain in 24 hours or 5 pounds in 1 week 2) shortness of breath, with or without a dry hacking cough 3) swelling in the hands, feet or stomach 4) if you have to sleep on extra pillows at night in order to breathe.  As directed     Call MD for:  difficulty breathing, headache or visual disturbances  As directed     Call MD for:  persistant nausea and vomiting  As directed     Call MD for:  severe uncontrolled pain  As directed     Call MD for:  temperature >100.4  As directed     Diet - low sodium heart healthy  As directed     Discharge instructions  As directed     Comments:      Continue home oxygen at 3 L per minute continuously via nasal cannula.    Increase activity slowly  As directed         Medication List    STOP taking these medications       ketoconazole 200 MG tablet  Commonly known as:  NIZORAL     lisinopril 40 MG tablet  Commonly known as:  PRINIVIL,ZESTRIL     naproxen 250 MG tablet  Commonly known as:  NAPROSYN      TAKE these medications       allopurinol 100 MG tablet  Commonly known as:  ZYLOPRIM  Take 100 mg by mouth every morning.     amitriptyline 75 MG tablet  Commonly known as:  ELAVIL  Take 1 tablet (75 mg total) by mouth at bedtime.     ammonium lactate 12 % cream  Commonly known as:  AMLACTIN  Apply 1 g topically daily as needed. Applies to back and legs.Marland KitchenMarland KitchenIn a thin layer on affected area for dry patches.     aspirin 325 MG tablet  Take 325 mg by mouth every evening.     atorvastatin 20 MG tablet  Commonly known as:  LIPITOR  Take 20 mg by mouth  every evening.     clonazePAM 1 MG tablet  Commonly known as:  KLONOPIN  Take 1 mg by mouth 2 (two) times daily.     cloNIDine 0.2 MG tablet  Commonly known as:  CATAPRES  Take 0.2 mg by mouth 2 (two) times daily.     diphenhydrAMINE 25 mg capsule  Commonly known as:  BENADRYL  Take 25 mg by mouth every 6 (six) hours as needed for itching or allergies. For itching     eucerin lotion  Apply topically 2 (two) times daily as needed. On affected areas for dry patches.     furosemide 40 MG tablet  Commonly known as:  LASIX  Take 40 mg by mouth every morning.     guaifenesin 100 MG/5ML syrup  Commonly known  as:  ROBITUSSIN  Take 200 mg by mouth every 4 (four) hours as needed for cough.     HYDROcodone-acetaminophen 10-325 MG per tablet  Commonly known as:  NORCO  Take 1 tablet by mouth every 6 (six) hours as needed for pain.     ipratropium 0.02 % nebulizer solution  Commonly known as:  ATROVENT  Take 2.5 mLs (0.5 mg total) by nebulization every 4 (four) hours.     isosorbide mononitrate 30 MG 24 hr tablet  Commonly known as:  IMDUR  Take 30 mg by mouth every morning.     ketoconazole 2 % cream  Commonly known as:  NIZORAL  Apply 1 application topically daily.     ketoconazole 2 % shampoo  Commonly known as:  NIZORAL  Apply 1 application topically 2 (two) times a week.     lubiprostone 24 MCG capsule  Commonly known as:  AMITIZA  Take 24 mcg by mouth daily as needed for constipation.     omega-3 acid ethyl esters 1 G capsule  Commonly known as:  LOVAZA  Take 2 g by mouth 2 (two) times daily.     pantoprazole 40 MG tablet  Commonly known as:  PROTONIX  Take 40 mg by mouth every morning.     promethazine 25 MG tablet  Commonly known as:  PHENERGAN  Take 25 mg by mouth every 6 (six) hours as needed. For nausea/vomiting     PROVENTIL HFA 108 (90 BASE) MCG/ACT inhaler  Generic drug:  albuterol  Inhale 2 puffs into the lungs every 4 (four) hours as needed for  wheezing or shortness of breath. For asthma relief     albuterol (2.5 MG/3ML) 0.083% nebulizer solution  Commonly known as:  PROVENTIL  Take 3 mLs (2.5 mg total) by nebulization every 6 (six) hours as needed for wheezing.     solifenacin 5 MG tablet  Commonly known as:  VESICARE  Take 5 mg by mouth every morning.     vitamin E 100 UNIT capsule  Take 100 Units by mouth every morning.       Follow-up Information   Follow up with Harvie Junior, MD. Schedule an appointment as soon as possible for a visit in 5 days. (To be seen with repeat labs (BMP))    Contact information:   [address] [place] [address] [phone]        The results of significant diagnostics from this hospitalization (including imaging, microbiology, ancillary and laboratory) are listed below for reference.    Significant Diagnostic Studies: Dg Chest 2 View  08/08/2012   *RADIOLOGY REPORT*  Clinical Data: Leg swelling.  Dysuria.  CHEST - 2 VIEW  Comparison: 07/30/2012  Findings: Cardiomegaly observed with low lung volumes.  Pulmonary venous hypertension noted with mildly prominent upper zone vasculature.  No overt edema.  No pleural effusion noted.  Thoracic spondylosis is present.  IMPRESSION:  1.  Cardiomegaly with pulmonary venous hypertension.  No overt edema.   Original Report Authenticated By: Van Clines, M.D.    Microbiology: Recent Results (from the past 240 hour(s))  URINE CULTURE     Status: None   Collection Time    08/08/12  4:49 AM      Result Value Range Status   Specimen Description URINE, CATHETERIZED   Final   Special Requests NONE   Final   Culture  Setup Time 08/08/2012 13:29   Final   Colony Count 20,OOO COLONIES/ML   Final   Culture  Final   Value: Multiple bacterial morphotypes present, none predominant. Suggest appropriate recollection if clinically indicated.   Report Status 08/09/2012 FINAL   Final     Labs: Basic Metabolic Panel:  Recent Labs Lab  08/08/12 0600 08/09/12 0500 08/10/12 0457 08/11/12 0520  NA 137 134* 136 135  K 4.5 4.8 5.3* 4.3  CL 99 98 99 94*  CO2 32 30 31 36*  GLUCOSE 111* 107* 102* 108*  BUN 24* 34* 26* 21  CREATININE 1.70* 2.10* 1.59* 1.18*  CALCIUM 8.9 8.6 8.7 8.8   Liver Function Tests:  Recent Labs Lab 08/08/12 0600  AST 11  ALT 16  ALKPHOS 58  BILITOT 0.5  PROT 6.5  ALBUMIN 3.2*   No results found for this basename: LIPASE, AMYLASE,  in the last 168 hours No results found for this basename: AMMONIA,  in the last 168 hours CBC:  Recent Labs Lab 08/08/12 0600  WBC 11.4*  NEUTROABS 7.0  HGB 12.0  HCT 39.7  MCV 92.8  PLT 310   Cardiac Enzymes: No results found for this basename: CKTOTAL, CKMB, CKMBINDEX, TROPONINI,  in the last 168 hours BNP: BNP (last 3 results)  Recent Labs  03/23/12 0007 05/09/12 0150 07/29/12 0210  PROBNP 133.5* 168.6* 492.0*   CBG: No results found for this basename: GLUCAP,  in the last 168 hours  Additional labs:    Signed:  Carlton Sweaney  [facility] 08/11/2012, 2:19 PM

## 2012-08-20 ENCOUNTER — Emergency Department (HOSPITAL_COMMUNITY)
Admission: EM | Admit: 2012-08-20 | Discharge: 2012-08-21 | Disposition: A | Discharge status: Home / Self Care | Payer: Medicaid Other | Attending: Emergency Medicine | Admitting: Emergency Medicine

## 2012-08-20 DIAGNOSIS — Z79899 Other long term (current) drug therapy: Secondary | ICD-10-CM | POA: Insufficient documentation

## 2012-08-20 DIAGNOSIS — Z8614 Personal history of Methicillin resistant Staphylococcus aureus infection: Secondary | ICD-10-CM | POA: Insufficient documentation

## 2012-08-20 DIAGNOSIS — M129 Arthropathy, unspecified: Secondary | ICD-10-CM | POA: Insufficient documentation

## 2012-08-20 DIAGNOSIS — R300 Dysuria: Secondary | ICD-10-CM | POA: Insufficient documentation

## 2012-08-20 DIAGNOSIS — K219 Gastro-esophageal reflux disease without esophagitis: Secondary | ICD-10-CM | POA: Insufficient documentation

## 2012-08-20 DIAGNOSIS — Z8639 Personal history of other endocrine, nutritional and metabolic disease: Secondary | ICD-10-CM | POA: Insufficient documentation

## 2012-08-20 DIAGNOSIS — Z7982 Long term (current) use of aspirin: Secondary | ICD-10-CM | POA: Insufficient documentation

## 2012-08-20 DIAGNOSIS — Z862 Personal history of diseases of the blood and blood-forming organs and certain disorders involving the immune mechanism: Secondary | ICD-10-CM | POA: Insufficient documentation

## 2012-08-20 DIAGNOSIS — R609 Edema, unspecified: Secondary | ICD-10-CM | POA: Insufficient documentation

## 2012-08-20 DIAGNOSIS — Q828 Other specified congenital malformations of skin: Secondary | ICD-10-CM | POA: Insufficient documentation

## 2012-08-20 DIAGNOSIS — Z87891 Personal history of nicotine dependence: Secondary | ICD-10-CM | POA: Insufficient documentation

## 2012-08-20 DIAGNOSIS — R350 Frequency of micturition: Secondary | ICD-10-CM | POA: Insufficient documentation

## 2012-08-20 DIAGNOSIS — R109 Unspecified abdominal pain: Secondary | ICD-10-CM | POA: Insufficient documentation

## 2012-08-20 DIAGNOSIS — Z5900 Homelessness unspecified: Secondary | ICD-10-CM | POA: Insufficient documentation

## 2012-08-20 DIAGNOSIS — J45909 Unspecified asthma, uncomplicated: Secondary | ICD-10-CM | POA: Insufficient documentation

## 2012-08-20 DIAGNOSIS — I10 Essential (primary) hypertension: Secondary | ICD-10-CM | POA: Insufficient documentation

## 2012-08-20 DIAGNOSIS — M109 Gout, unspecified: Secondary | ICD-10-CM | POA: Insufficient documentation

## 2012-08-20 DIAGNOSIS — R600 Localized edema: Secondary | ICD-10-CM

## 2012-08-21 ENCOUNTER — Encounter (HOSPITAL_COMMUNITY): Payer: Self-pay | Admitting: *Deleted

## 2012-08-21 LAB — CBC WITH DIFFERENTIAL/PLATELET
Basophils Absolute: 0 10*3/uL (ref 0.0–0.1)
Basophils Relative: 0 % (ref 0–1)
Eosinophils Absolute: 0.3 10*3/uL (ref 0.0–0.7)
Eosinophils Relative: 3 % (ref 0–5)
MCH: 28.2 pg (ref 26.0–34.0)
MCHC: 30.5 g/dL (ref 30.0–36.0)
MCV: 92.5 fL (ref 78.0–100.0)
Neutrophils Relative %: 67 % (ref 43–77)
Platelets: 239 10*3/uL (ref 150–400)
RBC: 4.4 MIL/uL (ref 3.87–5.11)
RDW: 15.2 % (ref 11.5–15.5)

## 2012-08-21 LAB — POCT I-STAT, CHEM 8
HCT: 39 % (ref 36.0–46.0)
Hemoglobin: 13.3 g/dL (ref 12.0–15.0)
Potassium: 3.8 mEq/L (ref 3.5–5.1)
Sodium: 146 mEq/L — ABNORMAL HIGH (ref 135–145)
TCO2: 32 mmol/L (ref 0–100)

## 2012-08-21 LAB — URINALYSIS, ROUTINE W REFLEX MICROSCOPIC
Glucose, UA: NEGATIVE mg/dL
Leukocytes, UA: NEGATIVE
pH: 5 (ref 5.0–8.0)

## 2012-08-21 MED ORDER — HYDROCODONE-ACETAMINOPHEN 5-325 MG PO TABS
1.0000 | ORAL_TABLET | Freq: Once | ORAL | Status: AC
  Administered 2012-08-21: 1 via ORAL
  Filled 2012-08-21: qty 1

## 2012-08-21 MED ORDER — HYDROCODONE-ACETAMINOPHEN 5-325 MG PO TABS: 1.0000 | ORAL_TABLET | Freq: Four times a day (QID) | ORAL | Status: DC | PRN

## 2012-08-21 NOTE — ED Provider Notes (Signed)
Medical screening examination/treatment/procedure(s) were performed by non-physician practitioner and as supervising physician I was immediately available for consultation/collaboration.  Kara Vincent. Alvino Chapel, [place] 08/21/12 [phone]

## 2012-08-21 NOTE — ED Notes (Signed)
Pt c/o sxs consistent w/ prior admission complaints of urosepsis. Pt states sxs of abdominal pain, dysuria, bilateral leg swelling and pain x 3 days. Pt recently admitted for urosepsis.

## 2012-08-21 NOTE — ED Notes (Signed)
NP at bedside speaking to pt.

## 2012-08-21 NOTE — ED Provider Notes (Addendum)
History     CSN: [id number]  Arrival date & time 08/20/12  2354   First MD Initiated Contact with Patient 08/21/12 0057      Chief Complaint  Patient presents with  . Dysuria    (Consider location/radiation/quality/duration/timing/severity/associated sxs/prior treatment) Patient is a 47 y.o. female presenting with dysuria. The history is provided by the patient.  Dysuria Pain quality:  Unable to specify Pain severity:  Mild Onset quality:  Unable to specify Timing:  Unable to specify Progression:  Worsening Chronicity:  Chronic Recent urinary tract infections: yes   Relieved by:  Antibiotics Worsened by:  Nothing tried Urinary symptoms: frequent urination   Associated symptoms: abdominal pain     Past Medical History  Diagnosis Date  . Asthma   . GERD (gastroesophageal reflux disease)   . Hypertension   . Homelessness   . Low back pain   . 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 Arthritis   . Gout   . Hyperlipemia   . Morbid obesity     uses motor wheel chair  . MRSA (methicillin resistant Staphylococcus aureus)     states about a year ago    Past Surgical History  Procedure Laterality Date  . Tubal ligation    . Cesarean section      Family History  Problem Relation Age of Onset  . Asthma Mother   . Diabetes Father   . Heart disease Father   . Stroke Father     History  Substance Use Topics  . Smoking status: Former Smoker    Types: Cigarettes    Quit date: 03/25/2003  . Smokeless tobacco: Former Systems developer    Quit date: 05/27/1978  . Alcohol Use: No    OB History   Grav Para Term Preterm Abortions TAB SAB Ect Mult Living                  Review of Systems  Gastrointestinal: Positive for abdominal pain.  Genitourinary: Positive for dysuria.    Allergies  Ibuprofen; Adhesive; Azithromycin; Doxycycline; Sulfa antibiotics; and Sulfamethoxazole w-trimethoprim  Home Medications   Current Outpatient Rx  Name  Route  Sig   Dispense  Refill  . albuterol (PROVENTIL HFA) 108 (90 BASE) MCG/ACT inhaler   Inhalation   Inhale 2 puffs into the lungs every 4 (four) hours as needed for wheezing or shortness of breath. For asthma relief         . albuterol (PROVENTIL) (2.5 MG/3ML) 0.083% nebulizer solution   Nebulization   Take 3 mLs (2.5 mg total) by nebulization every 6 (six) hours as needed for wheezing.   75 mL   0   . allopurinol (ZYLOPRIM) 100 MG tablet   Oral   Take 100 mg by mouth every morning.          [name] amitriptyline (ELAVIL) 75 MG tablet   Oral   Take 1 tablet (75 mg total) by mouth at bedtime.   30 tablet   0   . ammonium lactate (AMLACTIN) 12 % cream   Topical   Apply 1 g topically daily as needed. Applies to back and legs.Marland KitchenMarland KitchenIn a thin layer on affected area for dry patches.         [name] aspirin 325 MG tablet   Oral   Take 325 mg by mouth every evening.          [name] atorvastatin (LIPITOR) 20 MG tablet   Oral   Take  20 mg by mouth every morning.          . clonazePAM (KLONOPIN) 1 MG tablet   Oral   Take 1 mg by mouth 2 (two) times daily.         . cloNIDine (CATAPRES) 0.2 MG tablet   Oral   Take 0.2 mg by mouth 2 (two) times daily.         . diphenhydrAMINE (BENADRYL) 25 mg capsule   Oral   Take 25 mg by mouth every 6 (six) hours as needed for itching or allergies. For itching         . Emollient (EUCERIN) lotion   Topical   Apply topically 2 (two) times daily as needed. On affected areas for dry patches.         . furosemide (LASIX) 40 MG tablet   Oral   Take 40 mg by mouth every morning.         [name] guaifenesin (ROBITUSSIN) 100 MG/5ML syrup   Oral   Take 200 mg by mouth every 4 (four) hours as needed for cough.         [name] HYDROcodone-acetaminophen (NORCO) 10-325 MG per tablet   Oral   Take 1 tablet by mouth every 6 (six) hours as needed for pain.         [name] ipratropium (ATROVENT) 0.02 % nebulizer solution   Nebulization   Take 2.5 mLs (0.5 mg total) by  nebulization every 4 (four) hours.   75 mL   1   . isosorbide mononitrate (IMDUR) 30 MG 24 hr tablet   Oral   Take 30 mg by mouth every morning.         [name] ketoconazole (NIZORAL) 2 % cream   Topical   Apply 1 application topically daily.          [name] ketoconazole (NIZORAL) 2 % shampoo   Topical   Apply 1 application topically 2 (two) times a week.         . lubiprostone (AMITIZA) 24 MCG capsule   Oral   Take 24 mcg by mouth daily as needed for constipation.          [name] omega-3 acid ethyl esters (LOVAZA) 1 G capsule   Oral   Take 2 g by mouth 2 (two) times daily.         . pantoprazole (PROTONIX) 40 MG tablet   Oral   Take 40 mg by mouth every morning.          . promethazine (PHENERGAN) 25 MG tablet   Oral   Take 25 mg by mouth every 6 (six) hours as needed for nausea. For nausea/vomiting         . solifenacin (VESICARE) 5 MG tablet   Oral   Take 5 mg by mouth every morning.          . vitamin E 100 UNIT capsule   Oral   Take 100 Units by mouth every morning.           BP 130/67  Pulse 86  Temp(Src) 98 F (36.7 C) (Oral)  Resp 22  SpO2 96%  LMP 03/24/2009  Physical Exam  Nursing note and vitals reviewed. Constitutional: She is oriented to person, place, and time. She appears well-developed and well-nourished.  HENT:  Head: Normocephalic.  Eyes: Pupils are equal, round, and reactive to light.  Neck: Normal range of motion.  Cardiovascular: Normal rate and regular rhythm.   Pulmonary/Chest: Effort normal and  breath sounds normal.  Abdominal: Soft. Bowel sounds are normal. She exhibits no distension. There is no tenderness.  Genitourinary: No vaginal discharge found.  Musculoskeletal: Normal range of motion. She exhibits no edema.  Neurological: She is oriented to person, place, and time.  Skin: Skin is warm. No rash noted. No pallor.    ED Course  Procedures (including critical care time)  Labs Reviewed  URINALYSIS, ROUTINE W REFLEX  MICROSCOPIC - Abnormal; Notable for the following:    Specific Gravity, Urine 1.037 (*)    All other components within normal limits  CBC WITH DIFFERENTIAL - Abnormal; Notable for the following:    WBC 11.0 (*)    All other components within normal limits  POCT I-STAT, CHEM 8 - Abnormal; Notable for the following:    Sodium 146 (*)    Creatinine, Ser 1.50 (*)    Calcium, Ion 1.11 (*)    All other components within normal limits   No results found.   1. Edema of both legs   2. Dysuria       MDM   Labs and urine are normal.  Will  be discharged home to followup with her primary care physician        Garald Balding, NP 08/21/12 [place], NP 08/27/12 2019

## 2012-08-21 NOTE — ED Notes (Signed)
Attempted In and Out Cath with no production.  When spread labia for tube insertion, white tinged fluid released.  Tubing was impacted with white discharge upon removal.  Notified Lauren, RN of attempt.

## 2012-08-21 NOTE — ED Notes (Signed)
Pt notified of need for urine cath

## 2012-08-21 NOTE — ED Notes (Signed)
NP stated to start a foley cath for pt to obtain urine

## 2012-08-30 NOTE — ED Provider Notes (Signed)
Medical screening examination/treatment/procedure(s) were performed by non-physician practitioner and as supervising physician I was immediately available for consultation/collaboration.  Jasper Riling. Alvino Chapel, MD 08/30/12 [phone]

## 2012-09-24 ENCOUNTER — Encounter (HOSPITAL_COMMUNITY): Payer: Self-pay | Admitting: *Deleted

## 2012-09-24 ENCOUNTER — Emergency Department (HOSPITAL_COMMUNITY)
Admission: EM | Admit: 2012-09-24 | Discharge: 2012-09-25 | Disposition: A | Discharge status: Home / Self Care | Payer: Medicaid Other | Source: Home / Self Care | Attending: Emergency Medicine | Admitting: Emergency Medicine

## 2012-09-24 DIAGNOSIS — Z7982 Long term (current) use of aspirin: Secondary | ICD-10-CM | POA: Insufficient documentation

## 2012-09-24 DIAGNOSIS — J441 Chronic obstructive pulmonary disease with (acute) exacerbation: Secondary | ICD-10-CM | POA: Insufficient documentation

## 2012-09-24 DIAGNOSIS — Q828 Other specified congenital malformations of skin: Secondary | ICD-10-CM | POA: Insufficient documentation

## 2012-09-24 DIAGNOSIS — Z5900 Homelessness unspecified: Secondary | ICD-10-CM | POA: Insufficient documentation

## 2012-09-24 DIAGNOSIS — Z8639 Personal history of other endocrine, nutritional and metabolic disease: Secondary | ICD-10-CM | POA: Insufficient documentation

## 2012-09-24 DIAGNOSIS — H5789 Other specified disorders of eye and adnexa: Secondary | ICD-10-CM | POA: Insufficient documentation

## 2012-09-24 DIAGNOSIS — Z8776 Personal history of (corrected) congenital malformations of integument, limbs and musculoskeletal system: Secondary | ICD-10-CM | POA: Insufficient documentation

## 2012-09-24 DIAGNOSIS — Z79899 Other long term (current) drug therapy: Secondary | ICD-10-CM | POA: Insufficient documentation

## 2012-09-24 DIAGNOSIS — L03211 Cellulitis of face: Secondary | ICD-10-CM | POA: Insufficient documentation

## 2012-09-24 DIAGNOSIS — H00019 Hordeolum externum unspecified eye, unspecified eyelid: Secondary | ICD-10-CM | POA: Insufficient documentation

## 2012-09-24 DIAGNOSIS — J45901 Unspecified asthma with (acute) exacerbation: Secondary | ICD-10-CM | POA: Insufficient documentation

## 2012-09-24 DIAGNOSIS — M129 Arthropathy, unspecified: Secondary | ICD-10-CM | POA: Insufficient documentation

## 2012-09-24 DIAGNOSIS — I10 Essential (primary) hypertension: Secondary | ICD-10-CM | POA: Insufficient documentation

## 2012-09-24 DIAGNOSIS — K219 Gastro-esophageal reflux disease without esophagitis: Secondary | ICD-10-CM | POA: Insufficient documentation

## 2012-09-24 DIAGNOSIS — L0201 Cutaneous abscess of face: Secondary | ICD-10-CM | POA: Insufficient documentation

## 2012-09-24 DIAGNOSIS — Z87891 Personal history of nicotine dependence: Secondary | ICD-10-CM | POA: Insufficient documentation

## 2012-09-24 DIAGNOSIS — Z881 Allergy status to other antibiotic agents status: Secondary | ICD-10-CM | POA: Insufficient documentation

## 2012-09-24 DIAGNOSIS — R609 Edema, unspecified: Secondary | ICD-10-CM | POA: Insufficient documentation

## 2012-09-24 DIAGNOSIS — IMO0002 Reserved for concepts with insufficient information to code with codable children: Secondary | ICD-10-CM | POA: Insufficient documentation

## 2012-09-24 DIAGNOSIS — Z888 Allergy status to other drugs, medicaments and biological substances status: Secondary | ICD-10-CM | POA: Insufficient documentation

## 2012-09-24 DIAGNOSIS — M109 Gout, unspecified: Secondary | ICD-10-CM | POA: Insufficient documentation

## 2012-09-24 DIAGNOSIS — Z87768 Personal history of other specified (corrected) congenital malformations of integument, limbs and musculoskeletal system: Secondary | ICD-10-CM | POA: Insufficient documentation

## 2012-09-24 DIAGNOSIS — Z882 Allergy status to sulfonamides status: Secondary | ICD-10-CM | POA: Insufficient documentation

## 2012-09-24 DIAGNOSIS — M79609 Pain in unspecified limb: Secondary | ICD-10-CM | POA: Insufficient documentation

## 2012-09-24 DIAGNOSIS — H00016 Hordeolum externum left eye, unspecified eyelid: Secondary | ICD-10-CM

## 2012-09-24 DIAGNOSIS — Z862 Personal history of diseases of the blood and blood-forming organs and certain disorders involving the immune mechanism: Secondary | ICD-10-CM | POA: Insufficient documentation

## 2012-09-24 DIAGNOSIS — E785 Hyperlipidemia, unspecified: Secondary | ICD-10-CM | POA: Insufficient documentation

## 2012-09-24 DIAGNOSIS — Z8614 Personal history of Methicillin resistant Staphylococcus aureus infection: Secondary | ICD-10-CM | POA: Insufficient documentation

## 2012-09-24 DIAGNOSIS — R21 Rash and other nonspecific skin eruption: Secondary | ICD-10-CM | POA: Insufficient documentation

## 2012-09-24 MED ORDER — CEPHALEXIN 250 MG PO CAPS: 500.0000 mg | ORAL_CAPSULE | Freq: Once | ORAL | Status: DC

## 2012-09-24 MED ORDER — ALBUTEROL SULFATE (5 MG/ML) 0.5% IN NEBU
5.0000 mg | INHALATION_SOLUTION | RESPIRATORY_TRACT | Status: AC
  Administered 2012-09-25: 5 mg via RESPIRATORY_TRACT
  Filled 2012-09-24: qty 1

## 2012-09-24 MED ORDER — CIPROFLOXACIN HCL 0.3 % OP SOLN
1.0000 [drp] | Freq: Once | OPHTHALMIC | Status: AC
  Administered 2012-09-25: 1 [drp] via OPHTHALMIC
  Filled 2012-09-24: qty 2.5

## 2012-09-24 MED ORDER — CLINDAMYCIN HCL 300 MG PO CAPS
300.0000 mg | ORAL_CAPSULE | Freq: Once | ORAL | Status: AC
  Administered 2012-09-25: 300 mg via ORAL
  Filled 2012-09-24: qty 1

## 2012-09-24 NOTE — ED Provider Notes (Addendum)
History    CSN: [id number] Arrival date & time 09/24/12  2238  First MD Initiated Contact with Patient 09/24/12 2303     Chief Complaint  Patient presents with  . multiple complaints    (Consider location/radiation/quality/duration/timing/severity/associated sxs/prior Treatment) HPI Comments: Pt has hx of chronic asthma, she is morbidly obese, she has frequent skin infections and has had a chronic skin disorder - Darier disease.    She presents with several complaints:  1.  She has SOB - has been wheezing all week long - this is constant, improves termporarily with albuterol treatments, not associated with coughing or fevers.    2.  She has a rash to the R side of her face which is red and tender - she has been trying to squeeze it to get out pus, denies any significant drainage, no difficulty opening her jaw. This has been persistent, gradually worsening, not associated with fever chills nausea or vomiting  3.  she also complains of left eye discharge. She states that she has had a small amount of crusting and discharge from the left eye over the last couple of days, no changes in her vision.  The history is provided by the patient and medical records.   Past Medical History  Diagnosis Date  . Asthma   . GERD (gastroesophageal reflux disease)   . Hypertension   . Homelessness   . Low back pain   . 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 Arthritis   . Gout   . Hyperlipemia   . Morbid obesity     uses motor wheel chair  . MRSA (methicillin resistant Staphylococcus aureus)     states about a year ago   Past Surgical History  Procedure Laterality Date  . Tubal ligation    . Cesarean section     Family History  Problem Relation Age of Onset  . Asthma Mother   . Diabetes Father   . Heart disease Father   . Stroke Father    History  Substance Use Topics  . Smoking status: Former Smoker    Types: Cigarettes    Quit date: 03/25/2003  . Smokeless tobacco:  Former Systems developer    Quit date: 05/27/1978  . Alcohol Use: No   OB History   Grav Para Term Preterm Abortions TAB SAB Ect Mult Living                 Review of Systems  All other systems reviewed and are negative.    Allergies  Ibuprofen; Adhesive; Azithromycin; Doxycycline; Sulfa antibiotics; and Sulfamethoxazole w-trimethoprim  Home Medications   Current Outpatient Rx  Name  Route  Sig  Dispense  Refill  . albuterol (PROVENTIL HFA) 108 (90 BASE) MCG/ACT inhaler   Inhalation   Inhale 2 puffs into the lungs every 4 (four) hours as needed for wheezing or shortness of breath. For asthma relief         . albuterol (PROVENTIL) (2.5 MG/3ML) 0.083% nebulizer solution   Nebulization   Take 3 mLs (2.5 mg total) by nebulization every 6 (six) hours as needed for wheezing.   75 mL   0   . allopurinol (ZYLOPRIM) 100 MG tablet   Oral   Take 100 mg by mouth every morning.          [name] amitriptyline (ELAVIL) 75 MG tablet   Oral   Take 1 tablet (75 mg total) by mouth at bedtime.   Rosalia  tablet   0   . ammonium lactate (AMLACTIN) 12 % cream   Topical   Apply 1 g topically daily as needed. Applies to back and legs.Marland KitchenMarland KitchenIn a thin layer on affected area for dry patches.         [name] aspirin 325 MG tablet   Oral   Take 325 mg by mouth every evening.          [name] atorvastatin (LIPITOR) 20 MG tablet   Oral   Take 20 mg by mouth every morning.          . clonazePAM (KLONOPIN) 1 MG tablet   Oral   Take 1 mg by mouth 2 (two) times daily.         . cloNIDine (CATAPRES) 0.2 MG tablet   Oral   Take 0.2 mg by mouth 2 (two) times daily.         . diphenhydrAMINE (BENADRYL) 25 mg capsule   Oral   Take 25 mg by mouth every 6 (six) hours as needed for itching or allergies. For itching         . Emollient (EUCERIN) lotion   Topical   Apply topically 2 (two) times daily as needed. On affected areas for dry patches.         . furosemide (LASIX) 40 MG tablet   Oral   Take 40 mg by  mouth every morning.         [name] guaifenesin (ROBITUSSIN) 100 MG/5ML syrup   Oral   Take 200 mg by mouth every 4 (four) hours as needed for cough.         [name] HYDROcodone-acetaminophen (NORCO/VICODIN) 5-325 MG per tablet   Oral   Take 1 tablet by mouth every 6 (six) hours as needed for pain.   10 tablet   0   . ipratropium (ATROVENT) 0.02 % nebulizer solution   Nebulization   Take 2.5 mLs (0.5 mg total) by nebulization every 4 (four) hours.   75 mL   1   . isosorbide mononitrate (IMDUR) 30 MG 24 hr tablet   Oral   Take 30 mg by mouth every morning.         [name] ketoconazole (NIZORAL) 2 % cream   Topical   Apply 1 application topically daily.          [name] ketoconazole (NIZORAL) 2 % shampoo   Topical   Apply 1 application topically 2 (two) times a week.         . lubiprostone (AMITIZA) 24 MCG capsule   Oral   Take 24 mcg by mouth daily as needed for constipation.          [name] omega-3 acid ethyl esters (LOVAZA) 1 G capsule   Oral   Take 2 g by mouth 2 (two) times daily.         . pantoprazole (PROTONIX) 40 MG tablet   Oral   Take 40 mg by mouth every morning.          . promethazine (PHENERGAN) 25 MG tablet   Oral   Take 25 mg by mouth every 6 (six) hours as needed for nausea. For nausea/vomiting         . solifenacin (VESICARE) 5 MG tablet   Oral   Take 5 mg by mouth every morning.          . vitamin E 100 UNIT capsule   Oral   Take 100 Units by mouth every morning.         [name]  clindamycin (CLEOCIN) 150 MG capsule   Oral   Take 2 capsules (300 mg total) by mouth 3 (three) times daily. May dispense as 150mg  capsules   60 capsule   0   . predniSONE (DELTASONE) 20 MG tablet   Oral   Take 2 tablets (40 mg total) by mouth daily.   10 tablet   0    BP 101/61  Pulse 91  Temp(Src) 99.6 F (37.6 C) (Oral)  Resp 22  SpO2 99%  LMP 03/24/2009 Physical Exam  Nursing note and vitals reviewed. Constitutional: She appears well-developed and  well-nourished. No distress.  HENT:  Head: Normocephalic and atraumatic.  Mouth/Throat: Oropharynx is clear and moist. No oropharyngeal exudate.  Eyes: Conjunctivae and EOM are normal. Pupils are equal, round, and reactive to light. Right eye exhibits no discharge. Left eye exhibits no discharge. No scleral icterus.  Small stye upper lid of L eye, mild conj erythema  Neck: Normal range of motion. Neck supple. No JVD present. No thyromegaly present.  Cardiovascular: Normal rate, regular rhythm, normal heart sounds and intact distal pulses.  Exam reveals no gallop and no friction rub.   No murmur heard. Pulmonary/Chest: Effort normal. No respiratory distress. She has wheezes ( Bilateral mild expiratory wheezing). She has no rales.  Speaks in full sentences, no rales, no tachypnea, and no increased work of breathing, no accessory muscle use, patient is laying flat in the bed talking on the phone without any distress on my entrance into the room.  Abdominal: Soft. Bowel sounds are normal. She exhibits no distension and no mass. There is no tenderness.  Musculoskeletal: Normal range of motion. She exhibits edema ( minimal scant bilateral edema at the feet). She exhibits no tenderness.  Lymphadenopathy:    She has no cervical adenopathy.  Neurological: She is alert. Coordination normal.  Skin: Skin is warm and dry. Rash ( has small amount of redness to the R cheek.  has mild ttp in this area and has no drainage, no fluctuance.  Area is < 2cm in size) noted. No erythema.  Psychiatric: She has a normal mood and affect. Her behavior is normal.    ED Course  Procedures (including critical care time) Labs Reviewed - No data to display No results found. 1. Asthma attack   2. Stye, left   3. Cellulitis of face     MDM  #1 in the eye discharge is likely a mild conjunctivitis or related to this time, it away the patient will be treated with topical antibiotics and warm compresses  #2 shortness of  breath is likely related to the patient's underlying asthma. She does take Lasix, medical record review shows that the patient has had a slightly elevated BNP in the past, an echocardiogram in February of this year showed a normal ejection fraction with mild LVH  #3 the rash on her face is likely a cellulitis. 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 We'll start with clindamycin.  Nursing ordered and performed ECG   ED ECG REPORT  I personally interpreted this EKG   Date: 09/25/2012   Rate: 87  Rhythm: normal sinus rhythm  QRS Axis: normal  Intervals: normal  ST/T Wave abnormalities: normal  Conduction Disutrbances:none  Narrative Interpretation:   Old EKG Reviewed: unchanged c/w may 2014   Pt given neb, prednisone and clinda -   Meds given in  ED:  Medications  predniSONE (DELTASONE) tablet 40 mg (not administered)  albuterol (PROVENTIL) (5 MG/ML) 0.5% nebulizer solution 5 mg (5 mg Nebulization Given 09/25/12 0008)  ciprofloxacin (CILOXAN) 0.3 % ophthalmic solution 1 drop (1 drop Both Eyes Given 09/25/12 0007)  clindamycin (CLEOCIN) capsule 300 mg (300 mg Oral Given 09/25/12 0006)    New Prescriptions   CLINDAMYCIN (CLEOCIN) 150 MG CAPSULE    Take 2 capsules (300 mg total) by mouth 3 (three) times daily. May dispense as 150mg  capsules   PREDNISONE (DELTASONE) 20 MG TABLET    Take 2 tablets (40 mg total) by mouth daily.      Johnna Acosta, MD 09/25/12 [id number]  Johnna Acosta, MD 09/25/12 [phone]

## 2012-09-24 NOTE — ED Notes (Signed)
Pt has a raised red hard area just below her jaw line on the right side of her face.  Area is warm to touch.  Pt also has red spots on both arms that she is calling a rash.

## 2012-09-24 NOTE — ED Notes (Signed)
Thew pt has  A rash over her body for several days.  Swelling in both her legs and she has infected areas on her face and scalp

## 2012-09-25 ENCOUNTER — Emergency Department (HOSPITAL_COMMUNITY): Payer: Medicaid Other

## 2012-09-25 ENCOUNTER — Emergency Department (HOSPITAL_COMMUNITY)
Admission: EM | Admit: 2012-09-25 | Discharge: 2012-09-25 | Disposition: A | Discharge status: Home / Self Care | Payer: Medicaid Other | Attending: Emergency Medicine | Admitting: Emergency Medicine

## 2012-09-25 DIAGNOSIS — J45909 Unspecified asthma, uncomplicated: Secondary | ICD-10-CM

## 2012-09-25 DIAGNOSIS — J449 Chronic obstructive pulmonary disease, unspecified: Secondary | ICD-10-CM

## 2012-09-25 LAB — POCT I-STAT, CHEM 8
Creatinine, Ser: 1.2 mg/dL — ABNORMAL HIGH (ref 0.50–1.10)
HCT: 40 % (ref 36.0–46.0)
Hemoglobin: 13.6 g/dL (ref 12.0–15.0)
Potassium: 4.8 mEq/L (ref 3.5–5.1)
Sodium: 139 mEq/L (ref 135–145)
TCO2: 29 mmol/L (ref 0–100)

## 2012-09-25 MED ORDER — IPRATROPIUM BROMIDE 0.02 % IN SOLN
0.5000 mg | Freq: Once | RESPIRATORY_TRACT | Status: AC
  Administered 2012-09-25: 0.5 mg via RESPIRATORY_TRACT
  Filled 2012-09-25: qty 5
  Filled 2012-09-25: qty 2.5

## 2012-09-25 MED ORDER — ALBUTEROL SULFATE (5 MG/ML) 0.5% IN NEBU
5.0000 mg | INHALATION_SOLUTION | Freq: Once | RESPIRATORY_TRACT | Status: AC
  Administered 2012-09-25: 5 mg via RESPIRATORY_TRACT
  Filled 2012-09-25 (×2): qty 1

## 2012-09-25 MED ORDER — CLINDAMYCIN HCL 150 MG PO CAPS: 300.0000 mg | ORAL_CAPSULE | Freq: Three times a day (TID) | ORAL | Status: DC

## 2012-09-25 MED ORDER — HYDROCODONE-ACETAMINOPHEN 5-325 MG PO TABS: 1.0000 | ORAL_TABLET | Freq: Four times a day (QID) | ORAL | Status: DC | PRN

## 2012-09-25 MED ORDER — DEXAMETHASONE SODIUM PHOSPHATE 10 MG/ML IJ SOLN
10.0000 mg | Freq: Once | INTRAMUSCULAR | Status: AC
  Administered 2012-09-25: 10 mg via INTRAMUSCULAR
  Filled 2012-09-25: qty 1

## 2012-09-25 MED ORDER — PREDNISONE 20 MG PO TABS: 40.0000 mg | ORAL_TABLET | Freq: Every day | ORAL | Status: DC

## 2012-09-25 MED ORDER — PREDNISONE 20 MG PO TABS
40.0000 mg | ORAL_TABLET | Freq: Once | ORAL | Status: AC
  Administered 2012-09-25: 40 mg via ORAL
  Filled 2012-09-25: qty 2

## 2012-09-25 NOTE — ED Notes (Signed)
Pt. Stated, that she became short of breath beginning three to four days ago and progressively became worse. Patient also complains of a sore spot on the right side of her neck. Patinent has been using her MDI to help her breath but states it has not been helping.

## 2012-09-25 NOTE — ED Provider Notes (Signed)
History    CSN: [id number] Arrival date & time 09/25/12  [id number]  First MD Initiated Contact with Patient 09/25/12 [id number]     Chief Complaint  Patient presents with  . Leg Pain  . Shortness of Breath    HPI Patient just discharged from the emergency department.  Has chronic history of asthma and peripheral edema.  At the bus stop the patient became short of breath and started wheezing more.   Past Medical History  Diagnosis Date  . Asthma   . GERD (gastroesophageal reflux disease)   . Hypertension   . Homelessness   . Low back pain   . 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 Arthritis   . Gout   . Hyperlipemia   . Morbid obesity     uses motor wheel chair  . MRSA (methicillin resistant Staphylococcus aureus)     states about a year ago   Past Surgical History  Procedure Laterality Date  . Tubal ligation    . Cesarean section     Family History  Problem Relation Age of Onset  . Asthma Mother   . Diabetes Father   . Heart disease Father   . Stroke Father    History  Substance Use Topics  . Smoking status: Former Smoker    Types: Cigarettes    Quit date: 03/25/2003  . Smokeless tobacco: Former Systems developer    Quit date: 05/27/1978  . Alcohol Use: No   OB History   Grav Para Term Preterm Abortions TAB SAB Ect Mult Living                 Review of Systems All other systems reviewed and are negative Allergies  Ibuprofen; Adhesive; Azithromycin; Doxycycline; Sulfa antibiotics; and Sulfamethoxazole w-trimethoprim  Home Medications   Current Outpatient Rx  Name  Route  Sig  Dispense  Refill  . albuterol (PROVENTIL HFA) 108 (90 BASE) MCG/ACT inhaler   Inhalation   Inhale 2 puffs into the lungs every 4 (four) hours as needed for wheezing or shortness of breath. For asthma relief         . albuterol (PROVENTIL) (2.5 MG/3ML) 0.083% nebulizer solution   Nebulization   Take 3 mLs (2.5 mg total) by nebulization every 6 (six) hours as needed for wheezing.    75 mL   0   . allopurinol (ZYLOPRIM) 100 MG tablet   Oral   Take 100 mg by mouth every morning.          [name] amitriptyline (ELAVIL) 75 MG tablet   Oral   Take 1 tablet (75 mg total) by mouth at bedtime.   30 tablet   0   . aspirin 325 MG tablet   Oral   Take 325 mg by mouth every evening.          [name] atorvastatin (LIPITOR) 20 MG tablet   Oral   Take 20 mg by mouth every morning.          . clindamycin (CLEOCIN) 150 MG capsule   Oral   Take 2 capsules (300 mg total) by mouth 3 (three) times daily. May dispense as 150mg  capsules   60 capsule   0   . clonazePAM (KLONOPIN) 1 MG tablet   Oral   Take 1 mg by mouth 2 (two) times daily.         . cloNIDine (CATAPRES) 0.2 MG tablet   Oral   Take 0.2 mg by  mouth 2 (two) times daily.         . furosemide (LASIX) 40 MG tablet   Oral   Take 40 mg by mouth every morning.         [name] HYDROcodone-acetaminophen (NORCO/VICODIN) 5-325 MG per tablet   Oral   Take 1 tablet by mouth every 6 (six) hours as needed for pain.   10 tablet   0   . ipratropium (ATROVENT) 0.02 % nebulizer solution   Nebulization   Take 2.5 mLs (0.5 mg total) by nebulization every 4 (four) hours.   75 mL   1   . isosorbide mononitrate (IMDUR) 30 MG 24 hr tablet   Oral   Take 30 mg by mouth every morning.         . lubiprostone (AMITIZA) 24 MCG capsule   Oral   Take 24 mcg by mouth daily as needed for constipation.          . pantoprazole (PROTONIX) 40 MG tablet   Oral   Take 40 mg by mouth every morning.          . predniSONE (DELTASONE) 20 MG tablet   Oral   Take 2 tablets (40 mg total) by mouth daily.   10 tablet   0   . promethazine (PHENERGAN) 25 MG tablet   Oral   Take 25 mg by mouth every 6 (six) hours as needed for nausea. For nausea/vomiting         . solifenacin (VESICARE) 5 MG tablet   Oral   Take 5 mg by mouth every morning.                      BP 102/67  Pulse 79  Temp(Src) 97.8 F (36.6 C) (Oral)   Resp 15  SpO2 94%  LMP 03/24/2009 Physical Exam  Nursing note and vitals reviewed. Constitutional: She appears well-developed and well-nourished. No distress.  HENT:  Head: Normocephalic and atraumatic.  Mouth/Throat: Oropharynx is clear and moist. No oropharyngeal exudate.  Eyes: Conjunctivae and EOM are normal. Pupils are equal, round, and reactive to light. Right eye exhibits no discharge. Left eye exhibits no discharge. No scleral icterus.  Small stye upper lid of L eye, mild conj erythema  Neck: Normal range of motion. Neck supple. No JVD present. No thyromegaly present.  Cardiovascular: Normal rate, regular rhythm, normal heart sounds and intact distal pulses.  Exam reveals no gallop and no friction rub.   No murmur heard. Pulmonary/Chest: Effort normal. No respiratory distress. She has wheezes ( Bilateral mild expiratory wheezing). She has no rales.  Speaks in full sentences, no rales, no tachypnea, and no increased work of breathing, no accessory muscle use, patient is laying flat in the bed talking on the phone without any distress on my entrance into the room.  Abdominal: Soft. Bowel sounds are normal. She exhibits no distension and no mass. There is no tenderness.  Musculoskeletal: Normal range of motion. She exhibits edema ( minimal scant bilateral edema at the feet). She exhibits no tenderness.  Lymphadenopathy:    She has no cervical adenopathy.  Neurological: She is alert. Coordination normal.  Skin: Skin is warm and dry. Rash ( has small amount of redness to the R cheek.  has mild ttp in this area and has no drainage, no fluctuance.  Area is < 2cm in size) noted. No erythema.  Psychiatric: She has a normal mood and affect. Her behavior is normal.    ED  Course  Procedures (including critical care time) Labs Reviewed  PRO B NATRIURETIC PEPTIDE - Abnormal; Notable for the following:    Pro B Natriuretic peptide (BNP) 145.4 (*)    All other components within normal limits   POCT I-STAT, CHEM 8 - Abnormal; Notable for the following:    Creatinine, Ser 1.20 (*)    Glucose, Bld 174 (*)    All other components within normal limits   Medications  dexamethasone (DECADRON) injection 10 mg (not administered)  albuterol (PROVENTIL) (5 MG/ML) 0.5% nebulizer solution 5 mg (5 mg Nebulization Given 09/25/12 0823)  ipratropium (ATROVENT) nebulizer solution 0.5 mg (0.5 mg Nebulization Given 09/25/12 0823)    Dg Chest 2 View  09/25/2012   *RADIOLOGY REPORT*  Clinical Data: Shortness of breath, leg swelling  CHEST - 2 VIEW  Comparison: 08/08/2012  Findings: Low lung volumes with vascular crowding.  No frank interstitial edema. No pleural effusion or pneumothorax.  Mild cardiomegaly.  Mild degenerative changes of the visualized thoracolumbar spine.  IMPRESSION: Low lung volumes with vascular crowding.  No frank interstitial edema.   Original Report Authenticated By: Julian Hy, M.D.   1. COPD (chronic obstructive pulmonary disease)   2. Asthma, unspecified asthma severity, uncomplicated   3. Morbid obesity     MDM  Patient resting comfortably with no hypoxemia on room air.  Patient has home oxygen as needed.  After treatment in the ED the patient feels back to baseline and wants to go home.  Dot Lanes, MD 09/25/12 [phone]

## 2012-09-25 NOTE — ED Notes (Signed)
Pt. Sleeping on her left side, Dr. Audie Pinto in to reevaluate.

## 2012-10-05 ENCOUNTER — Emergency Department (HOSPITAL_COMMUNITY)
Admission: EM | Admit: 2012-10-05 | Discharge: 2012-10-05 | Disposition: A | Discharge status: Home / Self Care | Payer: Medicaid Other | Attending: Emergency Medicine | Admitting: Emergency Medicine

## 2012-10-05 ENCOUNTER — Encounter (HOSPITAL_COMMUNITY): Payer: Self-pay

## 2012-10-05 DIAGNOSIS — Z8614 Personal history of Methicillin resistant Staphylococcus aureus infection: Secondary | ICD-10-CM | POA: Insufficient documentation

## 2012-10-05 DIAGNOSIS — M1990 Unspecified osteoarthritis, unspecified site: Secondary | ICD-10-CM | POA: Insufficient documentation

## 2012-10-05 DIAGNOSIS — L02415 Cutaneous abscess of right lower limb: Secondary | ICD-10-CM

## 2012-10-05 DIAGNOSIS — E785 Hyperlipidemia, unspecified: Secondary | ICD-10-CM | POA: Insufficient documentation

## 2012-10-05 DIAGNOSIS — K219 Gastro-esophageal reflux disease without esophagitis: Secondary | ICD-10-CM | POA: Insufficient documentation

## 2012-10-05 DIAGNOSIS — Z7982 Long term (current) use of aspirin: Secondary | ICD-10-CM | POA: Insufficient documentation

## 2012-10-05 DIAGNOSIS — I10 Essential (primary) hypertension: Secondary | ICD-10-CM | POA: Insufficient documentation

## 2012-10-05 DIAGNOSIS — Z8739 Personal history of other diseases of the musculoskeletal system and connective tissue: Secondary | ICD-10-CM | POA: Insufficient documentation

## 2012-10-05 DIAGNOSIS — L02219 Cutaneous abscess of trunk, unspecified: Secondary | ICD-10-CM | POA: Insufficient documentation

## 2012-10-05 DIAGNOSIS — Z79899 Other long term (current) drug therapy: Secondary | ICD-10-CM | POA: Insufficient documentation

## 2012-10-05 DIAGNOSIS — Q828 Other specified congenital malformations of skin: Secondary | ICD-10-CM | POA: Insufficient documentation

## 2012-10-05 DIAGNOSIS — J45909 Unspecified asthma, uncomplicated: Secondary | ICD-10-CM | POA: Insufficient documentation

## 2012-10-05 DIAGNOSIS — Z87891 Personal history of nicotine dependence: Secondary | ICD-10-CM | POA: Insufficient documentation

## 2012-10-05 DIAGNOSIS — M109 Gout, unspecified: Secondary | ICD-10-CM | POA: Insufficient documentation

## 2012-10-05 DIAGNOSIS — L02419 Cutaneous abscess of limb, unspecified: Secondary | ICD-10-CM | POA: Insufficient documentation

## 2012-10-05 MED ORDER — HYDROCODONE-ACETAMINOPHEN 5-325 MG PO TABS: 2.0000 | ORAL_TABLET | ORAL | Status: DC | PRN

## 2012-10-05 MED ORDER — HYDROCODONE-ACETAMINOPHEN 5-325 MG PO TABS
1.0000 | ORAL_TABLET | Freq: Once | ORAL | Status: AC
  Administered 2012-10-05: 1 via ORAL
  Filled 2012-10-05: qty 1

## 2012-10-05 NOTE — ED Provider Notes (Signed)
History    CSN: [id number] Arrival date & time 10/05/12  0020  First MD Initiated Contact with Patient 10/05/12 0143     Chief Complaint  Patient presents with  . Recurrent Skin Infections   (Consider location/radiation/quality/duration/timing/severity/associated sxs/prior Treatment) HPI Provided by the patient. Multiple abscesses on clindamycin from primary care physician. No fevers or chills. No nausea vomiting. Has chronic history of same. Tonight having pain to left groin abscess and right thigh abscess, both of which are actively draining. No rash. Worse or lasts a few days. Has ran out of her hydrocodone at home  Past Medical History  Diagnosis Date  . Asthma   . GERD (gastroesophageal reflux disease)   . Hypertension   . Homelessness   . Low back pain   . 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 Arthritis   . Gout   . Hyperlipemia   . Morbid obesity     uses motor wheel chair  . MRSA (methicillin resistant Staphylococcus aureus)     states about a year ago   Past Surgical History  Procedure Laterality Date  . Tubal ligation    . Cesarean section     Family History  Problem Relation Age of Onset  . Asthma Mother   . Diabetes Father   . Heart disease Father   . Stroke Father    History  Substance Use Topics  . Smoking status: Former Smoker    Types: Cigarettes    Quit date: 03/25/2003  . Smokeless tobacco: Former Systems developer    Quit date: 05/27/1978  . Alcohol Use: No   OB History   Grav Para Term Preterm Abortions TAB SAB Ect Mult Living                 Review of Systems  Constitutional: Negative for fever and chills.  HENT: Negative for neck pain and neck stiffness.   Eyes: Negative for pain.  Respiratory: Negative for shortness of breath.   Cardiovascular: Negative for chest pain.  Gastrointestinal: Negative for abdominal pain.  Genitourinary: Negative for dysuria.  Musculoskeletal: Negative for back pain.  Skin: Positive for wound.  Negative for rash.  Neurological: Negative for headaches.  All other systems reviewed and are negative.    Allergies  Ibuprofen; Adhesive; Azithromycin; Doxycycline; Sulfa antibiotics; and Sulfamethoxazole w-trimethoprim  Home Medications   Current Outpatient Rx  Name  Route  Sig  Dispense  Refill  . albuterol (PROVENTIL HFA) 108 (90 BASE) MCG/ACT inhaler   Inhalation   Inhale 2 puffs into the lungs every 4 (four) hours as needed for wheezing or shortness of breath. For asthma relief         . albuterol (PROVENTIL) (2.5 MG/3ML) 0.083% nebulizer solution   Nebulization   Take 3 mLs (2.5 mg total) by nebulization every 6 (six) hours as needed for wheezing.   75 mL   0   . allopurinol (ZYLOPRIM) 100 MG tablet   Oral   Take 100 mg by mouth every morning.          [name] amitriptyline (ELAVIL) 75 MG tablet   Oral   Take 1 tablet (75 mg total) by mouth at bedtime.   30 tablet   0   . ammonium lactate (AMLACTIN) 12 % cream   Topical   Apply 1 g topically daily as needed. Applies to back and legs.Marland KitchenMarland KitchenIn a thin layer on affected area for dry patches.         [name]  aspirin 325 MG tablet   Oral   Take 325 mg by mouth every evening.          [name] atorvastatin (LIPITOR) 20 MG tablet   Oral   Take 20 mg by mouth every morning.          . clindamycin (CLEOCIN) 150 MG capsule   Oral   Take 2 capsules (300 mg total) by mouth 3 (three) times daily. May dispense as 150mg  capsules   60 capsule   0   . clonazePAM (KLONOPIN) 1 MG tablet   Oral   Take 1 mg by mouth 2 (two) times daily.         . cloNIDine (CATAPRES) 0.2 MG tablet   Oral   Take 0.2 mg by mouth 2 (two) times daily.         . diphenhydrAMINE (BENADRYL) 25 mg capsule   Oral   Take 25 mg by mouth every 6 (six) hours as needed for itching or allergies. For itching         . Emollient (EUCERIN) lotion   Topical   Apply topically 2 (two) times daily as needed. On affected areas for dry patches.         .  furosemide (LASIX) 40 MG tablet   Oral   Take 40 mg by mouth every morning.         [name] guaifenesin (ROBITUSSIN) 100 MG/5ML syrup   Oral   Take 200 mg by mouth every 4 (four) hours as needed for cough.         [name] HYDROcodone-acetaminophen (NORCO/VICODIN) 5-325 MG per tablet   Oral   Take 1 tablet by mouth every 6 (six) hours as needed for pain.   15 tablet   0   . HYDROcodone-acetaminophen (NORCO/VICODIN) 5-325 MG per tablet   Oral   Take 2 tablets by mouth every 4 (four) hours as needed for pain.   6 tablet   0   . ipratropium (ATROVENT) 0.02 % nebulizer solution   Nebulization   Take 2.5 mLs (0.5 mg total) by nebulization every 4 (four) hours.   75 mL   1   . isosorbide mononitrate (IMDUR) 30 MG 24 hr tablet   Oral   Take 30 mg by mouth every morning.         [name] ketoconazole (NIZORAL) 2 % cream   Topical   Apply 1 application topically daily.          [name] ketoconazole (NIZORAL) 2 % shampoo   Topical   Apply 1 application topically 2 (two) times a week.         . lubiprostone (AMITIZA) 24 MCG capsule   Oral   Take 24 mcg by mouth daily as needed for constipation.          [name] omega-3 acid ethyl esters (LOVAZA) 1 G capsule   Oral   Take 2 g by mouth 2 (two) times daily.         . pantoprazole (PROTONIX) 40 MG tablet   Oral   Take 40 mg by mouth every morning.          . predniSONE (DELTASONE) 20 MG tablet   Oral   Take 2 tablets (40 mg total) by mouth daily.   10 tablet   0   . promethazine (PHENERGAN) 25 MG tablet   Oral   Take 25 mg by mouth every 6 (six) hours as needed for nausea. For nausea/vomiting         .  solifenacin (VESICARE) 5 MG tablet   Oral   Take 5 mg by mouth every morning.          . vitamin E 100 UNIT capsule   Oral   Take 100 Units by mouth every morning.          BP 157/90  Pulse 82  Temp(Src) 98.6 F (37 C) (Oral)  Resp 20  SpO2 96%  LMP 03/24/2009 Physical Exam  Constitutional: She is oriented to person,  place, and time. She appears well-developed and well-nourished.  HENT:  Head: Normocephalic and atraumatic.  Eyes: EOM are normal. Pupils are equal, round, and reactive to light.  Neck: Neck supple.  Cardiovascular: Normal rate, regular rhythm and intact distal pulses.   Pulmonary/Chest: Effort normal. No respiratory distress.  Abdominal: Soft. Bowel sounds are normal.  obese  Musculoskeletal: Normal range of motion. She exhibits no edema.  Right posterior thigh with area of minimal tenderness and draining abscess without any underlying fluctuance. No increased warmth or surrounding cellulitis. Second area in the left groin region again with draining abscess and no surrounding cellulitis.   Neurological: She is alert and oriented to person, place, and time.  Skin: Skin is warm and dry.    ED Course  Procedures (including critical care time)   1. Multiple abscesses of both legs    Hydrocodone provided   Plan discharge home with recheck 48 hours, continue antibiotics as prescribed. Return precautions verbalized as understood. Patient agrees to be evaluated sooner for any fevers, vomiting or worsening condition.  MDM  Multiple abscesses, draining without indication for I&D. No cellulitis. Is on clindamycin. No systemic symptoms.  Patient is here for narcotics  Prescription provided quantity #6 Norco  Vital signs, nursing notes and previous records reviewed    Teressa Lower, MD 10/05/12 [phone]

## 2012-10-05 NOTE — ED Notes (Signed)
Pt complains of an abcess on the back of her right thigh and one in her groin area, she states that she's looking for another place to live also

## 2012-11-21 ENCOUNTER — Encounter (HOSPITAL_COMMUNITY): Payer: Self-pay | Admitting: Emergency Medicine

## 2012-11-21 ENCOUNTER — Emergency Department (HOSPITAL_COMMUNITY)
Admission: EM | Admit: 2012-11-21 | Discharge: 2012-11-22 | Disposition: A | Discharge status: Home / Self Care | Payer: Medicaid Other | Attending: Emergency Medicine | Admitting: Emergency Medicine

## 2012-11-21 DIAGNOSIS — R0602 Shortness of breath: Secondary | ICD-10-CM | POA: Insufficient documentation

## 2012-11-21 DIAGNOSIS — M129 Arthropathy, unspecified: Secondary | ICD-10-CM | POA: Insufficient documentation

## 2012-11-21 DIAGNOSIS — Z79899 Other long term (current) drug therapy: Secondary | ICD-10-CM | POA: Insufficient documentation

## 2012-11-21 DIAGNOSIS — K219 Gastro-esophageal reflux disease without esophagitis: Secondary | ICD-10-CM | POA: Insufficient documentation

## 2012-11-21 DIAGNOSIS — Z8614 Personal history of Methicillin resistant Staphylococcus aureus infection: Secondary | ICD-10-CM | POA: Insufficient documentation

## 2012-11-21 DIAGNOSIS — Z9981 Dependence on supplemental oxygen: Secondary | ICD-10-CM | POA: Insufficient documentation

## 2012-11-21 DIAGNOSIS — M109 Gout, unspecified: Secondary | ICD-10-CM | POA: Insufficient documentation

## 2012-11-21 DIAGNOSIS — Z87891 Personal history of nicotine dependence: Secondary | ICD-10-CM | POA: Insufficient documentation

## 2012-11-21 DIAGNOSIS — Z5900 Homelessness unspecified: Secondary | ICD-10-CM | POA: Insufficient documentation

## 2012-11-21 DIAGNOSIS — M79609 Pain in unspecified limb: Secondary | ICD-10-CM | POA: Insufficient documentation

## 2012-11-21 DIAGNOSIS — R21 Rash and other nonspecific skin eruption: Secondary | ICD-10-CM | POA: Insufficient documentation

## 2012-11-21 DIAGNOSIS — E785 Hyperlipidemia, unspecified: Secondary | ICD-10-CM | POA: Insufficient documentation

## 2012-11-21 DIAGNOSIS — IMO0001 Reserved for inherently not codable concepts without codable children: Secondary | ICD-10-CM | POA: Insufficient documentation

## 2012-11-21 DIAGNOSIS — Z881 Allergy status to other antibiotic agents status: Secondary | ICD-10-CM | POA: Insufficient documentation

## 2012-11-21 DIAGNOSIS — J45909 Unspecified asthma, uncomplicated: Secondary | ICD-10-CM | POA: Insufficient documentation

## 2012-11-21 DIAGNOSIS — Q828 Other specified congenital malformations of skin: Secondary | ICD-10-CM | POA: Insufficient documentation

## 2012-11-21 DIAGNOSIS — I10 Essential (primary) hypertension: Secondary | ICD-10-CM | POA: Insufficient documentation

## 2012-11-21 DIAGNOSIS — Z7982 Long term (current) use of aspirin: Secondary | ICD-10-CM | POA: Insufficient documentation

## 2012-11-21 DIAGNOSIS — Z888 Allergy status to other drugs, medicaments and biological substances status: Secondary | ICD-10-CM | POA: Insufficient documentation

## 2012-11-21 DIAGNOSIS — Z882 Allergy status to sulfonamides status: Secondary | ICD-10-CM | POA: Insufficient documentation

## 2012-11-21 DIAGNOSIS — G8929 Other chronic pain: Secondary | ICD-10-CM | POA: Insufficient documentation

## 2012-11-21 MED ORDER — IPRATROPIUM BROMIDE 0.02 % IN SOLN
0.5000 mg | Freq: Once | RESPIRATORY_TRACT | Status: AC
  Administered 2012-11-22: 0.5 mg via RESPIRATORY_TRACT
  Filled 2012-11-21: qty 2.5

## 2012-11-21 MED ORDER — HYDROCODONE-ACETAMINOPHEN 5-325 MG PO TABS
1.0000 | ORAL_TABLET | Freq: Once | ORAL | Status: AC
  Administered 2012-11-22: 1 via ORAL
  Filled 2012-11-21: qty 1

## 2012-11-21 MED ORDER — ALBUTEROL SULFATE (5 MG/ML) 0.5% IN NEBU
2.5000 mg | INHALATION_SOLUTION | Freq: Once | RESPIRATORY_TRACT | Status: AC
  Administered 2012-11-22: 2.5 mg via RESPIRATORY_TRACT
  Filled 2012-11-21: qty 0.5

## 2012-11-21 NOTE — ED Notes (Signed)
Reports she has been feeling increased shortness of breath x 3-4 days.  Always on 2 L O2 and uses c-pap at night.  Normally ambulatory with can but c/o increased bilat lower leg pain and unable to get around well. Is requesting an IV and pain medication.

## 2012-11-21 NOTE — ED Provider Notes (Signed)
CSN: [id number]     Arrival date & time 11/21/12  2231 History   First MD Initiated Contact with Patient 11/21/12 2313     Chief Complaint  Patient presents with  . Shortness of Breath   (Consider location/radiation/quality/duration/timing/severity/associated sxs/prior Treatment) The history is provided by the patient and the EMS personnel.   47 year old female brought in by EMS followed by Dr. [name]. Patient reports she has been feeling increased shortness of breath for the past 3-4 days. Patient is always on 2 L of home oxygen and uses CPAP at night. Normally ambulatory but now complaining of increased bilateral lower leg pain and unable to get around well. Patient came in requesting IV and pain medication. Take me to the ED to get IV pain meds. Patient has a history of chronic lower extremity pain. Patient also has a history of chronic skin sores. Suggestive of MRSA however she is allergic to sulfur and 2 doxycycline limiting treatment capabilities.     Past Medical History  Diagnosis Date  . Asthma   . GERD (gastroesophageal reflux disease)   . Hypertension   . Homelessness   . Low back pain   . 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 Arthritis   . Gout   . Hyperlipemia   . Morbid obesity     uses motor wheel chair  . MRSA (methicillin resistant Staphylococcus aureus)     states about a year ago   Past Surgical History  Procedure Laterality Date  . Tubal ligation    . Cesarean section     Family History  Problem Relation Age of Onset  . Asthma Mother   . Diabetes Father   . Heart disease Father   . Stroke Father    History  Substance Use Topics  . Smoking status: Former Smoker    Types: Cigarettes    Quit date: 03/25/2003  . Smokeless tobacco: Former Systems developer    Quit date: 05/27/1978  . Alcohol Use: No   OB History   Grav Para Term Preterm Abortions TAB SAB Ect Mult Living                 Review of Systems  Constitutional: Negative for fever.   HENT: Negative for congestion.   Eyes: Negative for redness.  Respiratory: Positive for shortness of breath.   Cardiovascular: Negative for chest pain.  Gastrointestinal: Negative for nausea, vomiting and abdominal pain.  Genitourinary: Negative for dysuria.  Musculoskeletal: Positive for myalgias. Negative for back pain.  Skin: Positive for rash and wound.  Neurological: Negative for headaches.  Hematological: Does not bruise/bleed easily.  Psychiatric/Behavioral: Negative for confusion.    Allergies  Ibuprofen; Adhesive; Azithromycin; Doxycycline; Sulfa antibiotics; and Sulfamethoxazole w-trimethoprim  Home Medications   Current Outpatient Rx  Name  Route  Sig  Dispense  Refill  . albuterol (PROVENTIL HFA) 108 (90 BASE) MCG/ACT inhaler   Inhalation   Inhale 2 puffs into the lungs every 4 (four) hours as needed for wheezing or shortness of breath. For asthma relief         . albuterol (PROVENTIL) (2.5 MG/3ML) 0.083% nebulizer solution   Nebulization   Take 3 mLs (2.5 mg total) by nebulization every 6 (six) hours as needed for wheezing.   75 mL   0   . allopurinol (ZYLOPRIM) 100 MG tablet   Oral   Take 100 mg by mouth every morning.          [name]  amitriptyline (ELAVIL) 75 MG tablet   Oral   Take 1 tablet (75 mg total) by mouth at bedtime.   30 tablet   0   . ammonium lactate (AMLACTIN) 12 % cream   Topical   Apply 1 g topically daily as needed. Applies to back and legs.Marland KitchenMarland KitchenIn a thin layer on affected area for dry patches.         [name] aspirin 325 MG tablet   Oral   Take 325 mg by mouth every evening.          [name] atorvastatin (LIPITOR) 20 MG tablet   Oral   Take 20 mg by mouth every morning.          . clonazePAM (KLONOPIN) 1 MG tablet   Oral   Take 1 mg by mouth 2 (two) times daily.         . cloNIDine (CATAPRES) 0.2 MG tablet   Oral   Take 0.2 mg by mouth 2 (two) times daily.         . diphenhydrAMINE (BENADRYL) 25 mg capsule   Oral   Take 25 mg  by mouth every 6 (six) hours as needed for itching or allergies. For itching         . Emollient (EUCERIN) lotion   Topical   Apply topically 2 (two) times daily as needed. On affected areas for dry patches.         . furosemide (LASIX) 40 MG tablet   Oral   Take 40 mg by mouth every morning.         [name] guaifenesin (ROBITUSSIN) 100 MG/5ML syrup   Oral   Take 200 mg by mouth every 4 (four) hours as needed for cough.         [name] HYDROcodone-acetaminophen (NORCO) 10-325 MG per tablet   Oral   Take 1 tablet by mouth every 6 (six) hours as needed for pain.         [name] ipratropium (ATROVENT) 0.02 % nebulizer solution   Nebulization   Take 2.5 mLs (0.5 mg total) by nebulization every 4 (four) hours.   75 mL   1   . ketoconazole (NIZORAL) 2 % cream   Topical   Apply 1 application topically daily.          [name] ketoconazole (NIZORAL) 2 % shampoo   Topical   Apply 1 application topically 2 (two) times a week.         [name] lisinopril (PRINIVIL,ZESTRIL) 40 MG tablet   Oral   Take 40 mg by mouth daily.         [name] lubiprostone (AMITIZA) 24 MCG capsule   Oral   Take 24 mcg by mouth daily as needed for constipation.          [name] omega-3 acid ethyl esters (LOVAZA) 1 G capsule   Oral   Take 2 g by mouth 2 (two) times daily.         . pantoprazole (PROTONIX) 40 MG tablet   Oral   Take 40 mg by mouth every morning.          . promethazine (PHENERGAN) 25 MG tablet   Oral   Take 25 mg by mouth every 6 (six) hours as needed for nausea. For nausea/vomiting         . solifenacin (VESICARE) 5 MG tablet   Oral   Take 5 mg by mouth every morning.          . vitamin  E 100 UNIT capsule   Oral   Take 100 Units by mouth every morning.         [name] HYDROcodone-acetaminophen (NORCO/VICODIN) 5-325 MG per tablet   Oral   Take 1-2 tablets by mouth every 6 (six) hours as needed for pain.   20 tablet   0    BP 153/73  Pulse 78  Temp(Src) 98.6 F (37 C) (Oral)  Resp 20  Ht  5\' 5"  (1.651 m)  Wt 430 lb (195.047 kg)  BMI 71.56 kg/m2  SpO2 95%  LMP 03/24/2009 Physical Exam  Nursing note and vitals reviewed. Constitutional: She is oriented to person, place, and time. She appears well-developed and well-nourished. No distress.  HENT:  Head: Normocephalic.  Mouth/Throat: Oropharynx is clear and moist.  Eyes: Conjunctivae and EOM are normal. Pupils are equal, round, and reactive to light.  Neck: Normal range of motion.  Cardiovascular: Normal rate and regular rhythm.   No murmur heard. Pulmonary/Chest: Effort normal and breath sounds normal. No respiratory distress. She has no wheezes. She has no rales.  Abdominal: Soft. Bowel sounds are normal. There is no tenderness.  Neurological: She is alert and oriented to person, place, and time. No cranial nerve deficit. She exhibits abnormal muscle tone. Coordination normal.  Skin: Skin is warm. Rash noted.  Patient with excoriated the wounds skin lesions throughout her body midportion the back upper extremities lower extremities looks as if she's been scratching areas. No evidence of secondary infection. No purulence. No vesicles.    ED Course  Procedures (including critical care time) Labs Review Labs Reviewed - No data to display Imaging Review Dg Chest 2 View  11/22/2012   *RADIOLOGY REPORT*  Clinical Data: Shortness of breath.  CHEST - 2 VIEW  Comparison: 09/25/2012.  Findings: Study limited by underpenetration and hypoaeration.  No change in cardiomegaly.  No change in vascular crowding at the bases.  No overt edema, effusion, or pneumothorax.  No acute osseous findings.  IMPRESSION: 1.  No evidence of acute cardiopulmonary disease. 2.  Hypoaeration. 3.  Chronic cardiomegaly.   Original Report Authenticated By: Jorje Guild      Date: 11/21/2012  Rate: 81  Rhythm: normal sinus rhythm and premature atrial contractions (PAC)  QRS Axis: normal  Intervals: normal  ST/T Wave abnormalities: normal  Conduction  Disutrbances:none  Narrative Interpretation:   Old EKG Reviewed: unchanged Short PR interval. No change in EKG compared to 09/25/2012     MDM   1. Shortness of breath   2. Chronic leg pain, right   3. Chronic leg pain, left    Patient is on home oxygen 2 L her sats here are 97%. Patient has been seen for similar complaint of shortness of breath and bilateral leg pain. Leg pain seems to be chronic in nature. Today's chest x-ray is negative for any pulmonary edema pneumonia or congestive heart failure. EKG has no acute changes compared to EKG from July 5. Patient improved with pain medication. Oxygen saturations have been fine. Patient treated with albuterol and Atrovent nebulizer even though there wasn't any wheezing however did make the patient feel better. Patient has no evidence of pulmonary edema or by him overload. Patient's primary care Dr. followup with.  Patient has chronic sores on her skin throughout the body no evidence of secondary infection. Could be MRSA related however patient has had these in the past they've been present for several months she states she is allergic to sulfur and to doxycycline. Last several  times on no treatment has been provided.   Mervin Kung, MD 11/22/12 [phone]

## 2012-11-22 ENCOUNTER — Emergency Department (HOSPITAL_COMMUNITY): Payer: Medicaid Other

## 2012-11-22 MED ORDER — HYDROCODONE-ACETAMINOPHEN 5-325 MG PO TABS
1.0000 | ORAL_TABLET | Freq: Once | ORAL | Status: AC
  Administered 2012-11-22: 1 via ORAL
  Filled 2012-11-22: qty 1

## 2012-11-22 MED ORDER — HYDROCODONE-ACETAMINOPHEN 5-325 MG PO TABS
1.0000 | ORAL_TABLET | Freq: Four times a day (QID) | ORAL | Status: DC | PRN
Start: 2012-11-22 — End: 2012-12-12

## 2012-11-22 NOTE — Progress Notes (Signed)
RT went to administer nebulizer treatment and patient is going to CT.  RT will attempt when patient returns.

## 2012-11-22 NOTE — ED Notes (Signed)
Patient stated that she takes 10mg  Vicodin at home.

## 2012-12-12 ENCOUNTER — Emergency Department (HOSPITAL_COMMUNITY)
Admission: EM | Admit: 2012-12-12 | Discharge: 2012-12-12 | Disposition: A | Discharge status: Home / Self Care | Payer: Medicaid Other | Attending: Emergency Medicine | Admitting: Emergency Medicine

## 2012-12-12 ENCOUNTER — Encounter (HOSPITAL_COMMUNITY): Payer: Self-pay | Admitting: *Deleted

## 2012-12-12 ENCOUNTER — Emergency Department (HOSPITAL_COMMUNITY): Payer: Medicaid Other

## 2012-12-12 DIAGNOSIS — Z8614 Personal history of Methicillin resistant Staphylococcus aureus infection: Secondary | ICD-10-CM | POA: Insufficient documentation

## 2012-12-12 DIAGNOSIS — Z8739 Personal history of other diseases of the musculoskeletal system and connective tissue: Secondary | ICD-10-CM | POA: Insufficient documentation

## 2012-12-12 DIAGNOSIS — Z8639 Personal history of other endocrine, nutritional and metabolic disease: Secondary | ICD-10-CM | POA: Insufficient documentation

## 2012-12-12 DIAGNOSIS — Z5900 Homelessness unspecified: Secondary | ICD-10-CM | POA: Insufficient documentation

## 2012-12-12 DIAGNOSIS — R600 Localized edema: Secondary | ICD-10-CM

## 2012-12-12 DIAGNOSIS — E785 Hyperlipidemia, unspecified: Secondary | ICD-10-CM | POA: Insufficient documentation

## 2012-12-12 DIAGNOSIS — Z862 Personal history of diseases of the blood and blood-forming organs and certain disorders involving the immune mechanism: Secondary | ICD-10-CM | POA: Insufficient documentation

## 2012-12-12 DIAGNOSIS — K219 Gastro-esophageal reflux disease without esophagitis: Secondary | ICD-10-CM | POA: Insufficient documentation

## 2012-12-12 DIAGNOSIS — R609 Edema, unspecified: Secondary | ICD-10-CM | POA: Insufficient documentation

## 2012-12-12 DIAGNOSIS — J45909 Unspecified asthma, uncomplicated: Secondary | ICD-10-CM | POA: Insufficient documentation

## 2012-12-12 DIAGNOSIS — Z79899 Other long term (current) drug therapy: Secondary | ICD-10-CM | POA: Insufficient documentation

## 2012-12-12 DIAGNOSIS — Z7982 Long term (current) use of aspirin: Secondary | ICD-10-CM | POA: Insufficient documentation

## 2012-12-12 DIAGNOSIS — G8929 Other chronic pain: Secondary | ICD-10-CM | POA: Insufficient documentation

## 2012-12-12 DIAGNOSIS — I10 Essential (primary) hypertension: Secondary | ICD-10-CM | POA: Insufficient documentation

## 2012-12-12 DIAGNOSIS — R05 Cough: Secondary | ICD-10-CM

## 2012-12-12 DIAGNOSIS — R059 Cough, unspecified: Secondary | ICD-10-CM | POA: Insufficient documentation

## 2012-12-12 DIAGNOSIS — Z87891 Personal history of nicotine dependence: Secondary | ICD-10-CM | POA: Insufficient documentation

## 2012-12-12 LAB — URINALYSIS, ROUTINE W REFLEX MICROSCOPIC
Ketones, ur: NEGATIVE mg/dL
Leukocytes, UA: NEGATIVE
Nitrite: NEGATIVE
pH: 6 (ref 5.0–8.0)

## 2012-12-12 LAB — PRO B NATRIURETIC PEPTIDE: Pro B Natriuretic peptide (BNP): 39 pg/mL (ref 0–125)

## 2012-12-12 LAB — CBC WITH DIFFERENTIAL/PLATELET
Basophils Absolute: 0 10*3/uL (ref 0.0–0.1)
Basophils Relative: 0 % (ref 0–1)
Eosinophils Absolute: 0.3 10*3/uL (ref 0.0–0.7)
Eosinophils Relative: 3 % (ref 0–5)
HCT: 41.2 % (ref 36.0–46.0)
Lymphocytes Relative: 27 % (ref 12–46)
MCH: 28.8 pg (ref 26.0–34.0)
Monocytes Absolute: 0.4 10*3/uL (ref 0.1–1.0)
Neutro Abs: 5.7 10*3/uL (ref 1.7–7.7)
Neutrophils Relative %: 66 % (ref 43–77)
Platelets: 255 10*3/uL (ref 150–400)
RDW: 15 % (ref 11.5–15.5)
WBC: 8.7 10*3/uL (ref 4.0–10.5)

## 2012-12-12 LAB — GLUCOSE, CAPILLARY: Glucose-Capillary: 117 mg/dL — ABNORMAL HIGH (ref 70–99)

## 2012-12-12 LAB — BASIC METABOLIC PANEL
CO2: 30 mEq/L (ref 19–32)
Calcium: 8.8 mg/dL (ref 8.4–10.5)
Chloride: 100 mEq/L (ref 96–112)
Sodium: 139 mEq/L (ref 135–145)

## 2012-12-12 MED ORDER — GUAIFENESIN-DM 100-10 MG/5ML PO SYRP: 5.0000 mL | ORAL_SOLUTION | Freq: Three times a day (TID) | ORAL | Status: DC | PRN

## 2012-12-12 MED ORDER — HYDROCODONE-ACETAMINOPHEN 5-325 MG PO TABS
2.0000 | ORAL_TABLET | Freq: Once | ORAL | Status: AC
  Administered 2012-12-12: 2 via ORAL
  Filled 2012-12-12: qty 2

## 2012-12-12 NOTE — ED Provider Notes (Addendum)
CSN: [id number]     Arrival date & time 12/12/12  0228 History   First MD Initiated Contact with Patient 12/12/12 [phone]     Chief Complaint  Patient presents with  . Generalized Body Aches   (Consider location/radiation/quality/duration/timing/severity/associated sxs/prior Treatment) HPI 47 year old female presents to emergency department with complaints of 3-4 days of worsening shortness of breath, peripheral edema , wheezing, cough, total body pain.  Patient is requesting pain medications.  She reports that she is taking her Lasix 40 mg twice a day and not having any improvement in her edema.  She reports history of congestive heart failure, sleep apnea, asthma.  Patient had similar presentation about 2 weeks ago to the emergency department.  She reports she is followed by Dr. York Ram.  She has followup with him within the month. Past Medical History  Diagnosis Date  . Asthma   . GERD (gastroesophageal reflux disease)   . Hypertension   . Homelessness   . Low back pain   . 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 Arthritis   . Gout   . Hyperlipemia   . Morbid obesity     uses motor wheel chair  . MRSA (methicillin resistant Staphylococcus aureus)     states about a year ago   Past Surgical History  Procedure Laterality Date  . Tubal ligation    . Cesarean section     Family History  Problem Relation Age of Onset  . Asthma Mother   . Diabetes Father   . Heart disease Father   . Stroke Father    History  Substance Use Topics  . Smoking status: Former Smoker    Types: Cigarettes    Quit date: 03/25/2003  . Smokeless tobacco: Former Systems developer    Quit date: 05/27/1978  . Alcohol Use: No   OB History   Grav Para Term Preterm Abortions TAB SAB Ect Mult Living                 Review of Systems  See History of Present Illness; otherwise all other systems are reviewed and negative Allergies  Ibuprofen; Adhesive; Azithromycin; Doxycycline; Sulfa  antibiotics; and Sulfamethoxazole w-trimethoprim  Home Medications   Current Outpatient Rx  Name  Route  Sig  Dispense  Refill  . albuterol (PROVENTIL HFA) 108 (90 BASE) MCG/ACT inhaler   Inhalation   Inhale 2 puffs into the lungs every 4 (four) hours as needed for wheezing or shortness of breath. For asthma relief         . albuterol (PROVENTIL) (2.5 MG/3ML) 0.083% nebulizer solution   Nebulization   Take 3 mLs (2.5 mg total) by nebulization every 6 (six) hours as needed for wheezing.   75 mL   0   . allopurinol (ZYLOPRIM) 100 MG tablet   Oral   Take 100 mg by mouth every morning.          [name] amitriptyline (ELAVIL) 75 MG tablet   Oral   Take 1 tablet (75 mg total) by mouth at bedtime.   30 tablet   0   . ammonium lactate (AMLACTIN) 12 % cream   Topical   Apply 1 g topically daily as needed. Applies to back and legs.Marland KitchenMarland KitchenIn a thin layer on affected area for dry patches.         [name] aspirin 325 MG tablet   Oral   Take 325 mg by mouth every evening.          [name]  atorvastatin (LIPITOR) 20 MG tablet   Oral   Take 20 mg by mouth every morning.          . clonazePAM (KLONOPIN) 1 MG tablet   Oral   Take 1 mg by mouth 2 (two) times daily.         . cloNIDine (CATAPRES) 0.2 MG tablet   Oral   Take 0.2 mg by mouth 2 (two) times daily.         . diphenhydrAMINE (BENADRYL) 25 mg capsule   Oral   Take 25 mg by mouth every 6 (six) hours as needed for itching or allergies. For itching         . Emollient (EUCERIN) lotion   Topical   Apply topically 2 (two) times daily as needed. On affected areas for dry patches.         . furosemide (LASIX) 40 MG tablet   Oral   Take 40 mg by mouth every morning.         [name] guaifenesin (ROBITUSSIN) 100 MG/5ML syrup   Oral   Take 200 mg by mouth every 4 (four) hours as needed for cough.         [name] HYDROcodone-acetaminophen (NORCO) 10-325 MG per tablet   Oral   Take 1 tablet by mouth every 6 (six) hours as needed for  pain.         [name] ipratropium (ATROVENT) 0.02 % nebulizer solution   Nebulization   Take 2.5 mLs (0.5 mg total) by nebulization every 4 (four) hours.   75 mL   1   . ketoconazole (NIZORAL) 2 % cream   Topical   Apply 1 application topically daily.          [name] lisinopril (PRINIVIL,ZESTRIL) 40 MG tablet   Oral   Take 40 mg by mouth daily.         [name] lubiprostone (AMITIZA) 24 MCG capsule   Oral   Take 24 mcg by mouth daily as needed for constipation.          [name] omega-3 acid ethyl esters (LOVAZA) 1 G capsule   Oral   Take 2 g by mouth 2 (two) times daily.         . pantoprazole (PROTONIX) 40 MG tablet   Oral   Take 40 mg by mouth every morning.          . promethazine (PHENERGAN) 25 MG tablet   Oral   Take 25 mg by mouth every 6 (six) hours as needed for nausea. For nausea/vomiting         . solifenacin (VESICARE) 5 MG tablet   Oral   Take 5 mg by mouth every morning.          . vitamin E 100 UNIT capsule   Oral   Take 100 Units by mouth every morning.         [name] ketoconazole (NIZORAL) 2 % shampoo   Topical   Apply 1 application topically 2 (two) times a week.          BP 110/50  Pulse 74  Temp(Src) 98 F (36.7 C) (Oral)  Resp 18  SpO2 95%  LMP 03/24/2009 Physical Exam  Nursing note and vitals reviewed. Constitutional: She is oriented to person, place, and time.  Morbidly obese female  HENT:  Head: Normocephalic and atraumatic.  Nose: Nose normal.  Mouth/Throat: Oropharynx is clear and moist.  Eyes: Conjunctivae and EOM are normal. Pupils are equal, round, and  reactive to light.  Neck: Normal range of motion. Neck supple. No JVD present. No tracheal deviation present. No thyromegaly present.  Cardiovascular: Normal rate, regular rhythm, normal heart sounds and intact distal pulses.  Exam reveals no gallop and no friction rub.   No murmur heard. Pulmonary/Chest: Effort normal and breath sounds normal. No stridor. She has no wheezes. She has no  rales. She exhibits no tenderness.  Patient with wheezing noises from larynx only, no wheezing noted in lower lung fields, bilaterally.  She has good air movement  Abdominal: Soft. Bowel sounds are normal. She exhibits no distension and no mass. Tenderness: diffuse tenderness. There is no rebound and no guarding.  Musculoskeletal: Normal range of motion. She exhibits edema (nonpitting edema noted to lower extremities). Tenderness: diffuse tenderness to palpation anywhere on the body.  Lymphadenopathy:    She has no cervical adenopathy.  Neurological: She is alert and oriented to person, place, and time. She exhibits normal muscle tone. Coordination normal.  Skin: Skin is warm and dry.  Rash noted over lower extremities with secondary excoriation.  Appears to be folliculitis    ED Course  Procedures (including critical care time) Labs Review Labs Reviewed  GLUCOSE, CAPILLARY - Abnormal; Notable for the following:    Glucose-Capillary 117 (*)    All other components within normal limits  BASIC METABOLIC PANEL - Abnormal; Notable for the following:    Glucose, Bld 111 (*)    Creatinine, Ser 1.21 (*)    GFR calc non Af Amer 52 (*)    GFR calc Af Amer 60 (*)    All other components within normal limits  CBC WITH DIFFERENTIAL  PRO B NATRIURETIC PEPTIDE  URINALYSIS, ROUTINE W REFLEX MICROSCOPIC   Imaging Review Dg Chest 2 View  12/12/2012   CLINICAL DATA:  Shortness of breath  EXAM: CHEST  2 VIEW  COMPARISON:  11/22/2012  FINDINGS: Chronic cardiomegaly. No focal opacity, edema, effusion, or pneumothorax. No acute osseous findings.  IMPRESSION: 1. No active cardiopulmonary disease. 2. Chronic cardiomegaly.   Electronically Signed   By: Jorje Guild   On: 12/12/2012 04:30    Date: 12/12/2012  Rate: 68  Rhythm: normal sinus rhythm  QRS Axis: normal  Intervals: normal  ST/T Wave abnormalities: normal  Conduction Disutrbances:none  Narrative Interpretation:   Old EKG Reviewed:  unchanged   MDM   1. Lower extremity edema   2. Cough   3. Chronic pain     47 year old female with report of acute or chronic pain, shortness of breath, wheezing, cough, and concern for CHF exacerbation.  Patient has edema on exam, but nonpitting in nature.  There are no rales, no wheezes.  She does have a cough.  Her sense of pneumonia, fluid overload or CHF on x-ray or lab work.  Patient seen for similar complaints 2 weeks ago, this appears to be a chronic condition that she needs to followup with her primary care physician for further pain medicine prescriptions.    Kalman Drape, MD 12/12/12 0556  6:17 AM Patient complaining of abscess under her right arm.  She reports she wants pain medicine for this abscess.  Patient with what appears to be chronic hidradenitis under her right arm in her axilla.  There is no warmth, fluctuance.  There is a hard, indurated area with overlying chronic skin changes.  Do not feel this needs to be drained at this time.  I advised the patient to use warm soaks and followup with her  primary care doctor as previously instructed.  Kalman Drape, MD 12/12/12 [id number]  Kalman Drape, MD 12/12/12 [phone]

## 2012-12-12 NOTE — ED Notes (Signed)
Pt is requesting more pain medication. Sharol Given, MD is aware.

## 2012-12-12 NOTE — ED Notes (Signed)
Pt is complaining of large abscess under her right arm. Sharol Given, MD assessed this abscess and has issued no new orders. Pt to be discharged.

## 2012-12-12 NOTE — ED Notes (Signed)
Otter, MD at bedside.  

## 2012-12-12 NOTE — ED Notes (Signed)
Per EMS: pt has been having generalized body pain more severe than normal for the past 3-4 days.

## 2012-12-26 IMAGING — CR DG CHEST 2V
2 series · 2 of 2 positions shown · non-contrast
Comparison: 10/10/2009

CLINICAL DATA: Short of breath

CHEST - 2 VIEW

[w chest pa]
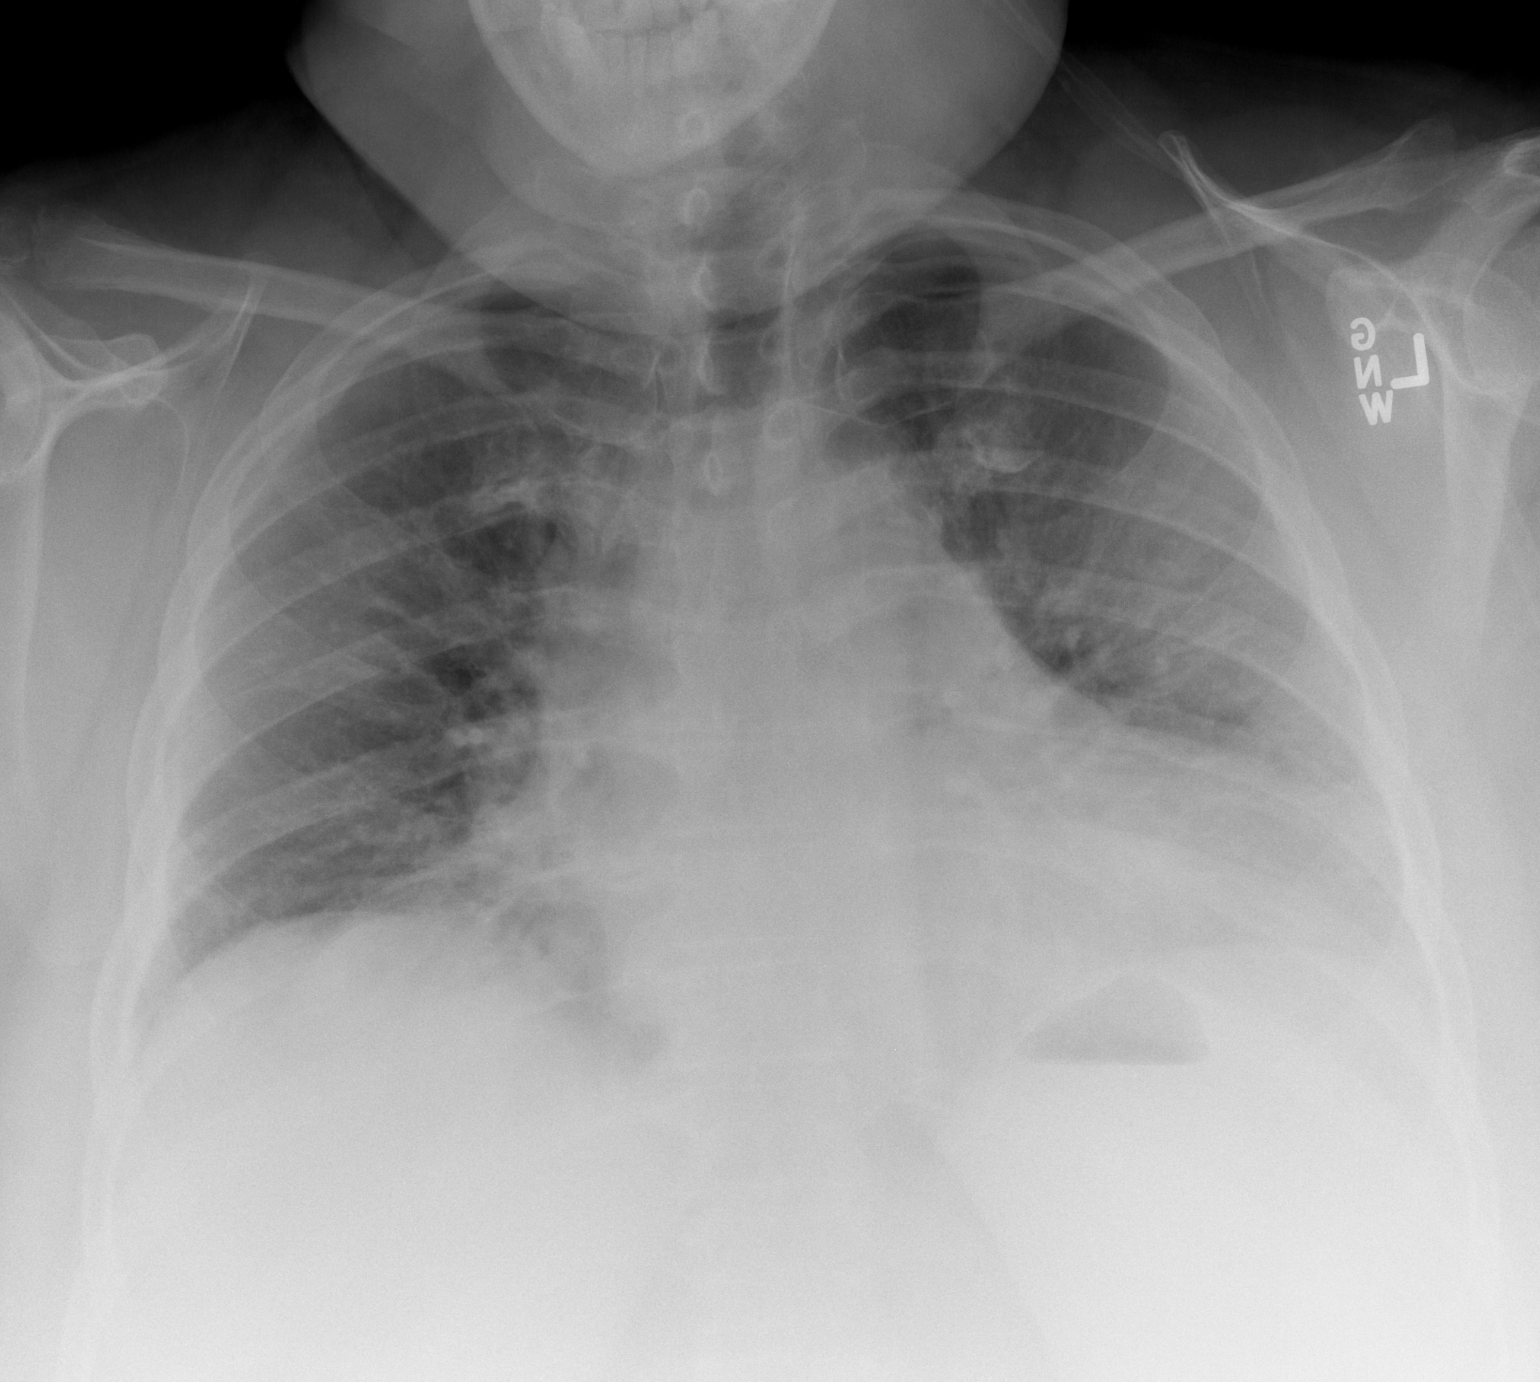

[w chest lat *]
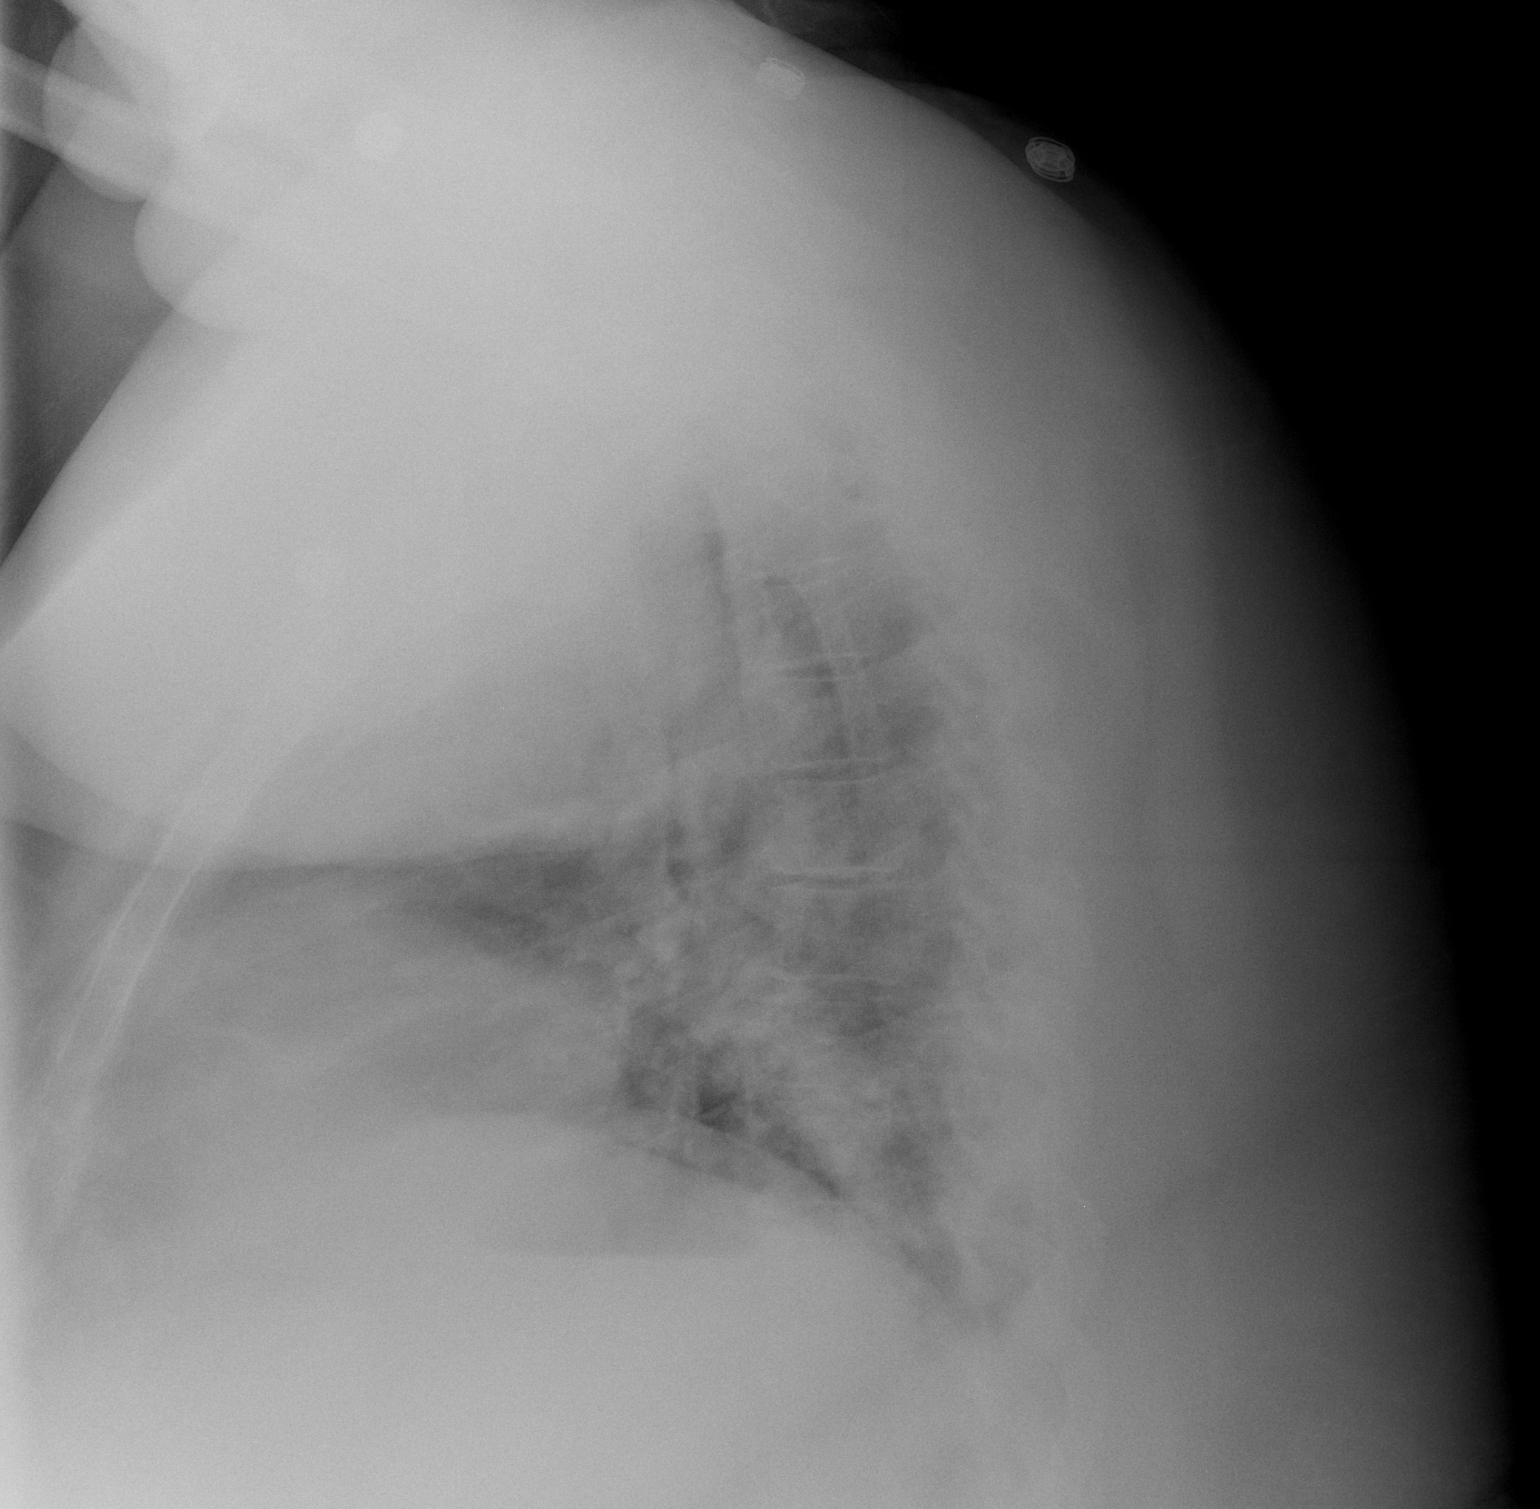

[2 of 2 positions shown; findings below may reference images not displayed]

FINDINGS: Moderate cardiomegaly.  Increased vascular congestion.
Bibasilar atelectasis.  No interstitial edema.
IMPRESSION: Increased vascular congestion and bibasilar atelectasis.

## 2012-12-28 IMAGING — CR DG CHEST 2V
1 series · 1 of 1 positions shown · non-contrast
Comparison: June 09, 2010

CLINICAL DATA: Shortness of breath, cough, dizziness, hypertension

CHEST - 2 VIEW

[view not recorded]
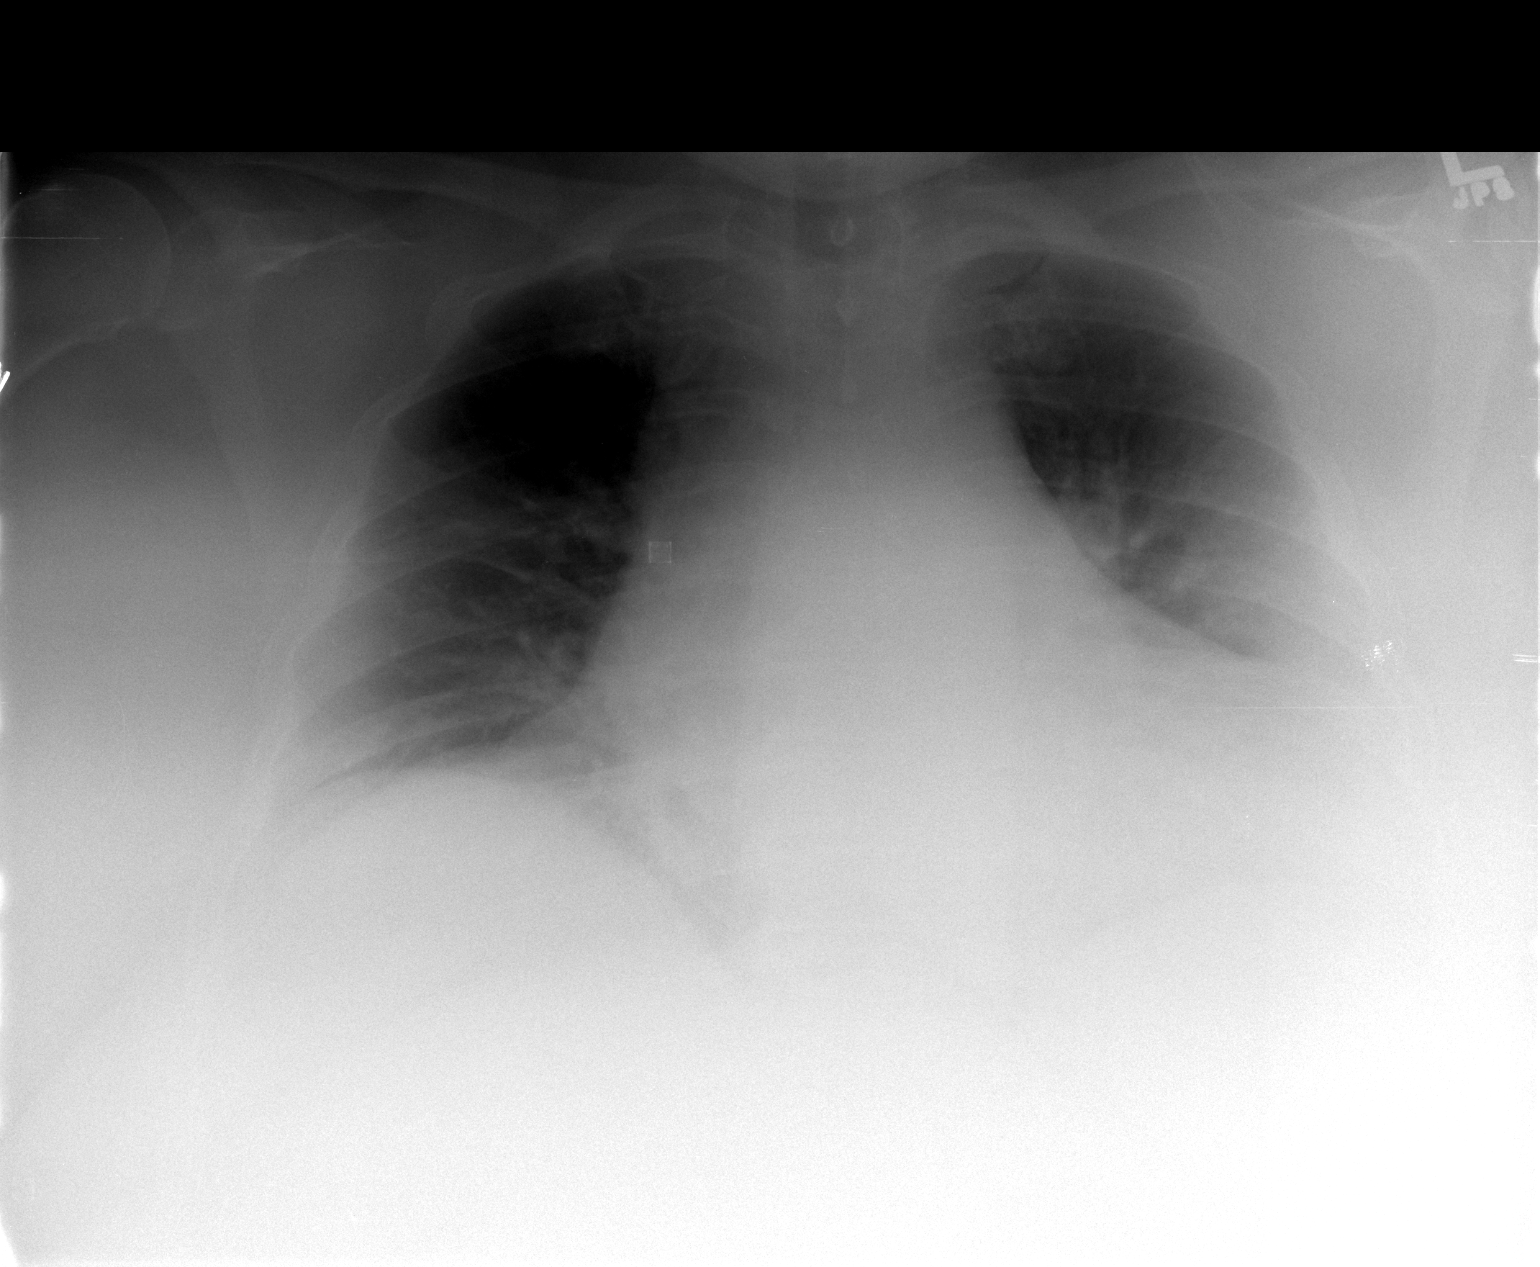

[1 of 1 positions shown; findings below may reference images not displayed]

FINDINGS: Cardiomegaly and pulmonary vascular cephalization
persist.  Frank edema has resolved.  No gross pleural effusions are
identified.
IMPRESSION: Cardiomegaly and pulmonary vascular cephalization without frank
edema today.

## 2013-01-05 ENCOUNTER — Encounter (HOSPITAL_COMMUNITY): Payer: Self-pay | Admitting: Emergency Medicine

## 2013-01-05 ENCOUNTER — Emergency Department (HOSPITAL_COMMUNITY)
Admission: EM | Admit: 2013-01-05 | Discharge: 2013-01-06 | Disposition: A | Discharge status: Home / Self Care | Payer: Medicaid Other | Attending: Emergency Medicine | Admitting: Emergency Medicine

## 2013-01-05 DIAGNOSIS — Z79899 Other long term (current) drug therapy: Secondary | ICD-10-CM | POA: Insufficient documentation

## 2013-01-05 DIAGNOSIS — J45909 Unspecified asthma, uncomplicated: Secondary | ICD-10-CM | POA: Insufficient documentation

## 2013-01-05 DIAGNOSIS — Z87891 Personal history of nicotine dependence: Secondary | ICD-10-CM | POA: Insufficient documentation

## 2013-01-05 DIAGNOSIS — I129 Hypertensive chronic kidney disease with stage 1 through stage 4 chronic kidney disease, or unspecified chronic kidney disease: Secondary | ICD-10-CM | POA: Insufficient documentation

## 2013-01-05 DIAGNOSIS — Z8614 Personal history of Methicillin resistant Staphylococcus aureus infection: Secondary | ICD-10-CM | POA: Insufficient documentation

## 2013-01-05 DIAGNOSIS — N183 Chronic kidney disease, stage 3 unspecified: Secondary | ICD-10-CM | POA: Insufficient documentation

## 2013-01-05 DIAGNOSIS — Z7982 Long term (current) use of aspirin: Secondary | ICD-10-CM | POA: Insufficient documentation

## 2013-01-05 DIAGNOSIS — G8929 Other chronic pain: Secondary | ICD-10-CM | POA: Insufficient documentation

## 2013-01-05 DIAGNOSIS — M109 Gout, unspecified: Secondary | ICD-10-CM | POA: Insufficient documentation

## 2013-01-05 DIAGNOSIS — K219 Gastro-esophageal reflux disease without esophagitis: Secondary | ICD-10-CM | POA: Insufficient documentation

## 2013-01-05 DIAGNOSIS — E785 Hyperlipidemia, unspecified: Secondary | ICD-10-CM | POA: Insufficient documentation

## 2013-01-05 NOTE — ED Notes (Signed)
Bed: [id number] Expected date: 01/05/13 Expected time: 11:16 PM Means of arrival: Ambulance Comments: Bed 11, EMS, 47 F, Swelling

## 2013-01-05 NOTE — ED Notes (Signed)
Brought in by EMS from home with c/o "generalized body swelling".  Per EMS, pt reports that she observed developing "swelling all over" 1 week ago; pt states condition is getting progressively worse and was advised to come to ED by her PCP.

## 2013-01-06 ENCOUNTER — Emergency Department (HOSPITAL_COMMUNITY): Payer: Medicaid Other

## 2013-01-06 LAB — BASIC METABOLIC PANEL
Chloride: 98 mEq/L (ref 96–112)
Creatinine, Ser: 1.57 mg/dL — ABNORMAL HIGH (ref 0.50–1.10)
GFR calc Af Amer: 44 mL/min — ABNORMAL LOW (ref >90)
GFR calc non Af Amer: 38 mL/min — ABNORMAL LOW (ref >90)
Potassium: 4.2 mEq/L (ref 3.5–5.1)
Sodium: 135 mEq/L (ref 135–145)

## 2013-01-06 LAB — CBC WITH DIFFERENTIAL/PLATELET
Basophils Absolute: 0 10*3/uL (ref 0.0–0.1)
Basophils Relative: 0 % (ref 0–1)
Hemoglobin: 13.5 g/dL (ref 12.0–15.0)
MCHC: 31.2 g/dL (ref 30.0–36.0)
Monocytes Relative: 6 % (ref 3–12)
Neutro Abs: 8.3 10*3/uL — ABNORMAL HIGH (ref 1.7–7.7)
Neutrophils Relative %: 73 % (ref 43–77)
Platelets: 266 10*3/uL (ref 150–400)
RDW: 14.8 % (ref 11.5–15.5)
WBC: 11.3 10*3/uL — ABNORMAL HIGH (ref 4.0–10.5)

## 2013-01-06 LAB — PRO B NATRIURETIC PEPTIDE: Pro B Natriuretic peptide (BNP): 43.2 pg/mL (ref 0–125)

## 2013-01-06 MED ORDER — SODIUM CHLORIDE 0.9 % IV SOLN
INTRAVENOUS | Status: DC
  Administered 2013-01-06: 01:00:00 via INTRAVENOUS

## 2013-01-06 MED ORDER — TRAMADOL HCL 50 MG PO TABS
50.0000 mg | ORAL_TABLET | Freq: Once | ORAL | Status: AC
  Administered 2013-01-06: 50 mg via ORAL
  Filled 2013-01-06: qty 1

## 2013-01-06 NOTE — Discharge Instructions (Signed)
Stop taking your lasix and call your doctor tomorrow to schedule a follow up visit Chronic Kidney Disease Chronic kidney disease occurs when the kidneys are damaged over a long period. The kidneys are two organs that lie on either side of the spine between the middle of the back and the front of the abdomen. The kidneys:   Remove wastes and extra water from the blood.   Produce important hormones. These help keep bones strong, regulate blood pressure, and help create red blood cells.   Balance the fluids and chemicals in the blood and tissues. A small amount of kidney damage may not cause problems, but a large amount of damage may make it difficult or impossible for the kidneys to work the way they should. If steps are not taken to slow down the kidney damage or stop it from getting worse, the kidneys may stop working permanently. Most of the time, chronic kidney disease does not go away. However, it can often be controlled, and those with the disease can usually live normal lives. CAUSES  The most common causes of chronic kidney disease are diabetes and high blood pressure (hypertension). Chronic kidney disease may also be caused by:   Diseases that cause kidneys' filters to become inflamed.   Diseases that affect the immune system.   Genetic diseases.   Medicines that damage the kidneys, such as anti-inflammatory medicines.  Poisoning or exposure to toxic substances.   A reoccurring kidney or urinary infection.   A problem with urine flow. This may be caused by:   Cancer.   Kidney stones.   An enlarged prostate in males. SYMPTOMS  Because the kidney damage in chronic kidney disease occurs slowly, symptoms develop slowly and may not be obvious until the kidney damage becomes severe. A person may have a kidney disease for years without showing any symptoms. Symptoms can include:   Swelling (edema) of the legs, ankles, or feet.   Tiredness (lethargy).   Nausea or  vomiting.   Confusion.   Problems with urination, such as:   Decreased urine production.   Frequent urination, especially at night.   Frequent accidents in children who are potty trained.   Muscle twitches and cramps.   Shortness of breath.  Weakness.   Persistent itchiness.   Loss of appetite.  Metallic taste in the mouth.  Trouble sleeping.  Slowed development in children.  Short stature in children. DIAGNOSIS  Chronic kidney disease may be detected and diagnosed by tests, including blood, urine, imaging, or kidney biopsy tests.  TREATMENT  Most chronic kidney diseases cannot be cured. Treatment usually involves relieving symptoms and preventing or slowing the progression of the disease. Treatment may include:   A special diet. You may need to avoid alcohol and foods thatare salty and high in potassium.   Medicines. These may:   Lower blood pressure.   Relieve anemia.   Relieve swelling.   Protect the bones. HOME CARE INSTRUCTIONS   Follow your prescribed diet.   Only take over-the-counter or prescription medicines as directed by your caregiver.  Do not take any new medicines (prescription, over-the-counter, or nutritional supplements) unless approved by your caregiver. Many medicines can worsen your kidney damage or need to have the dose adjusted.   Quit smoking if you are a smoker. Talk to your caregiver about a smoking cessation program.   Keep all follow-up appointments as directed by your caregiver. SEEK IMMEDIATE MEDICAL CARE IF:  Your symptoms get worse or you develop new symptoms.  You develop symptoms of end-stage kidney disease. These include:   Headaches.   Abnormally dark or light skin.   Numbness in the hands or feet.   Easy bruising.   Frequent hiccups.   Menstruation stops.   You have a fever.   You have decreased urine production.   You havepain or bleeding when urinating. MAKE SURE  YOU:  Understand these instructions.  Will watch your condition.  Will get help right away if you are not doing well or get worse. FOR MORE INFORMATION  American Association of Kidney Patients: BombTimer.gl National Kidney Foundation: www.kidney.[place]: https://mathis.com/ [facility]: www.lifeoptions.org and www.kidneyschool.org Document Released: 12/18/2007 Document Revised: 02/25/2012 Document Reviewed: 11/07/2011 [facility] Patient Information 2014 [place], [place].

## 2013-01-06 NOTE — ED Provider Notes (Addendum)
CSN: [id number]     Arrival date & time 01/05/13  2316 History   First MD Initiated Contact with Patient 01/06/13 0001     Chief Complaint  Patient presents with  . Body Swelling    (Consider location/radiation/quality/duration/timing/severity/associated sxs/prior Treatment) The history is provided by the patient.   patient here complaining of chronic generalized swelling is worse x3 days. Denies any cough or shortness of breath. Does use Lasix but hasn't been working. Notes worsening chronic pain from her swelling. Denies any orthopnea but does note some dyspnea on exertion. Denies any anginal type chest pain. Seen here in the past for this multiple times. No other treatments used prior to arrival.  Past Medical History  Diagnosis Date  . Asthma   . GERD (gastroesophageal reflux disease)   . Hypertension   . Homelessness   . Low back pain   . Darier disease     chronic, followed by Dr. Nevada Vincent  . Arthritis   . Gout   . Hyperlipemia   . Morbid obesity     uses motor wheel chair  . MRSA (methicillin resistant Staphylococcus aureus)     states about a year ago   Past Surgical History  Procedure Laterality Date  . Tubal ligation    . Cesarean section     Family History  Problem Relation Age of Onset  . Asthma Mother   . Diabetes Father   . Heart disease Father   . Stroke Father    History  Substance Use Topics  . Smoking status: Former Smoker    Types: Cigarettes    Quit date: 03/25/2003  . Smokeless tobacco: Former Systems developer    Quit date: 05/27/1978  . Alcohol Use: No   OB History   Grav Para Term Preterm Abortions TAB SAB Ect Mult Living                 Review of Systems  All other systems reviewed and are negative.    Allergies  Ibuprofen; Adhesive; Azithromycin; Doxycycline; Sulfa antibiotics; and Sulfamethoxazole-trimethoprim  Home Medications   Current Outpatient Rx  Name  Route  Sig  Dispense  Refill  . albuterol (PROVENTIL HFA) 108 (90 BASE) MCG/ACT  inhaler   Inhalation   Inhale 2 puffs into the lungs every 4 (four) hours as needed for wheezing or shortness of breath. For asthma relief         . albuterol (PROVENTIL) (2.5 MG/3ML) 0.083% nebulizer solution   Nebulization   Take 3 mLs (2.5 mg total) by nebulization every 6 (six) hours as needed for wheezing.   75 mL   0   . allopurinol (ZYLOPRIM) 100 MG tablet   Oral   Take 100 mg by mouth every morning.          [name] amitriptyline (ELAVIL) 75 MG tablet   Oral   Take 1 tablet (75 mg total) by mouth at bedtime.   30 tablet   0   . ammonium lactate (AMLACTIN) 12 % cream   Topical   Apply 1 g topically daily as needed. Applies to back and legs.Marland KitchenMarland KitchenIn a thin layer on affected area for dry patches.         [name] aspirin 325 MG tablet   Oral   Take 325 mg by mouth every evening.          [name] atorvastatin (LIPITOR) 20 MG tablet   Oral   Take 20 mg by mouth every morning.          [name]  clonazePAM (KLONOPIN) 1 MG tablet   Oral   Take 1 mg by mouth 2 (two) times daily.         . cloNIDine (CATAPRES) 0.2 MG tablet   Oral   Take 0.2 mg by mouth 2 (two) times daily.         . diphenhydrAMINE (BENADRYL) 25 mg capsule   Oral   Take 25 mg by mouth every 6 (six) hours as needed for itching or allergies. For itching         . Emollient (EUCERIN) lotion   Topical   Apply topically 2 (two) times daily as needed. On affected areas for dry patches.         . furosemide (LASIX) 40 MG tablet   Oral   Take 40 mg by mouth every morning.         [name] HYDROcodone-acetaminophen (NORCO) 10-325 MG per tablet   Oral   Take 1 tablet by mouth every 6 (six) hours as needed for pain.         [name] ipratropium (ATROVENT) 0.02 % nebulizer solution   Nebulization   Take 2.5 mLs (0.5 mg total) by nebulization every 4 (four) hours.   75 mL   1   . ketoconazole (NIZORAL) 2 % cream   Topical   Apply 1 application topically daily.          [name] ketoconazole (NIZORAL) 2 % shampoo   Topical    Apply 1 application topically 2 (two) times a week.         [name] lisinopril (PRINIVIL,ZESTRIL) 40 MG tablet   Oral   Take 40 mg by mouth daily.         [name] lubiprostone (AMITIZA) 24 MCG capsule   Oral   Take 24 mcg by mouth daily as needed for constipation.          [name] omega-3 acid ethyl esters (LOVAZA) 1 G capsule   Oral   Take 2 g by mouth 2 (two) times daily.         . pantoprazole (PROTONIX) 40 MG tablet   Oral   Take 40 mg by mouth every morning.          . promethazine (PHENERGAN) 25 MG tablet   Oral   Take 25 mg by mouth every 6 (six) hours as needed for nausea. For nausea/vomiting         . solifenacin (VESICARE) 5 MG tablet   Oral   Take 5 mg by mouth every morning.          . vitamin E 100 UNIT capsule   Oral   Take 100 Units by mouth every morning.         [name] guaiFENesin-dextromethorphan (ROBITUSSIN DM) 100-10 MG/5ML syrup   Oral   Take 5 mLs by mouth 3 (three) times daily as needed for cough.   118 mL   0    BP 113/67  Pulse 89  Temp(Src) 98.7 F (37.1 C) (Oral)  Resp 18  Ht 5\' 5"  (1.651 m)  SpO2 94%  LMP 03/24/2009 Physical Exam  Nursing note and vitals reviewed. Constitutional: She is oriented to person, place, and time. She appears well-developed and well-nourished.  Non-toxic appearance. No distress.  HENT:  Head: Normocephalic and atraumatic.  Eyes: Conjunctivae, EOM and lids are normal. Pupils are equal, round, and reactive to light.  Neck: Normal range of motion. Neck supple. No tracheal deviation present. No mass present.  Cardiovascular: Normal rate, regular  rhythm and normal heart sounds.  Exam reveals no gallop.   No murmur heard. Pulmonary/Chest: Effort normal and breath sounds normal. No stridor. No respiratory distress. She has no decreased breath sounds. She has no wheezes. She has no rhonchi. She has no rales.  Abdominal: Soft. Normal appearance and bowel sounds are normal. She exhibits no distension. There is no tenderness.  There is no rebound and no CVA tenderness.  Musculoskeletal: Normal range of motion. She exhibits no edema and no tenderness.  1+ edema bilateral lower extremities  Neurological: She is alert and oriented to person, place, and time. She has normal strength. No cranial nerve deficit or sensory deficit. GCS eye subscore is 4. GCS verbal subscore is 5. GCS motor subscore is 6.  Skin: Skin is warm and dry. No abrasion and no rash noted.  Psychiatric: She has a normal mood and affect. Her speech is normal and behavior is normal.    ED Course  Procedures (including critical care time) Labs Review Labs Reviewed  CBC WITH DIFFERENTIAL  BASIC METABOLIC PANEL  PRO B NATRIURETIC PEPTIDE   Imaging Review No results found.  EKG Interpretation   None       MDM  No diagnosis found. Patients with mild renal insufficiency noted on her bmet. I reviewed the patient's old medical records and she does have a history of this. This is likely from her use of Lasix. Patient instructed to call her doctor tomorrow to schedule a followup visit. Patient instructed to hold her lasix    Leota Jacobsen, MD 01/06/13 [id number]  Leota Jacobsen, MD 01/06/13 [phone]

## 2013-01-09 ENCOUNTER — Emergency Department (HOSPITAL_COMMUNITY): Payer: Medicaid Other

## 2013-01-09 ENCOUNTER — Encounter (HOSPITAL_COMMUNITY): Payer: Self-pay | Admitting: Emergency Medicine

## 2013-01-09 ENCOUNTER — Emergency Department (HOSPITAL_COMMUNITY)
Admission: EM | Admit: 2013-01-09 | Discharge: 2013-01-09 | Disposition: A | Discharge status: Home / Self Care | Payer: Medicaid Other | Attending: Emergency Medicine | Admitting: Emergency Medicine

## 2013-01-09 DIAGNOSIS — Z79899 Other long term (current) drug therapy: Secondary | ICD-10-CM | POA: Insufficient documentation

## 2013-01-09 DIAGNOSIS — K219 Gastro-esophageal reflux disease without esophagitis: Secondary | ICD-10-CM | POA: Insufficient documentation

## 2013-01-09 DIAGNOSIS — R0602 Shortness of breath: Secondary | ICD-10-CM

## 2013-01-09 DIAGNOSIS — Z8614 Personal history of Methicillin resistant Staphylococcus aureus infection: Secondary | ICD-10-CM | POA: Insufficient documentation

## 2013-01-09 DIAGNOSIS — M109 Gout, unspecified: Secondary | ICD-10-CM | POA: Insufficient documentation

## 2013-01-09 DIAGNOSIS — Z87891 Personal history of nicotine dependence: Secondary | ICD-10-CM | POA: Insufficient documentation

## 2013-01-09 DIAGNOSIS — E785 Hyperlipidemia, unspecified: Secondary | ICD-10-CM | POA: Insufficient documentation

## 2013-01-09 DIAGNOSIS — M7989 Other specified soft tissue disorders: Secondary | ICD-10-CM | POA: Insufficient documentation

## 2013-01-09 DIAGNOSIS — J45901 Unspecified asthma with (acute) exacerbation: Secondary | ICD-10-CM | POA: Insufficient documentation

## 2013-01-09 DIAGNOSIS — G8929 Other chronic pain: Secondary | ICD-10-CM | POA: Insufficient documentation

## 2013-01-09 DIAGNOSIS — I10 Essential (primary) hypertension: Secondary | ICD-10-CM | POA: Insufficient documentation

## 2013-01-09 DIAGNOSIS — M129 Arthropathy, unspecified: Secondary | ICD-10-CM | POA: Insufficient documentation

## 2013-01-09 DIAGNOSIS — N183 Chronic kidney disease, stage 3 unspecified: Secondary | ICD-10-CM | POA: Insufficient documentation

## 2013-01-09 LAB — BASIC METABOLIC PANEL
BUN: 23 mg/dL (ref 6–23)
Calcium: 9.3 mg/dL (ref 8.4–10.5)
Chloride: 97 mEq/L (ref 96–112)
GFR calc Af Amer: 63 mL/min — ABNORMAL LOW (ref >90)
GFR calc non Af Amer: 54 mL/min — ABNORMAL LOW (ref >90)
Glucose, Bld: 124 mg/dL — ABNORMAL HIGH (ref 70–99)
Potassium: 3.8 mEq/L (ref 3.5–5.1)

## 2013-01-09 LAB — POCT I-STAT TROPONIN I: Troponin i, poc: 0 ng/mL (ref 0.00–0.08)

## 2013-01-09 LAB — CBC
HCT: 44.1 % (ref 36.0–46.0)
Hemoglobin: 13.9 g/dL (ref 12.0–15.0)
MCHC: 31.5 g/dL (ref 30.0–36.0)
Platelets: 295 10*3/uL (ref 150–400)
RDW: 14.7 % (ref 11.5–15.5)
WBC: 13.8 10*3/uL — ABNORMAL HIGH (ref 4.0–10.5)

## 2013-01-09 LAB — PRO B NATRIURETIC PEPTIDE: Pro B Natriuretic peptide (BNP): 55.7 pg/mL (ref 0–125)

## 2013-01-09 MED ORDER — ACETAMINOPHEN 325 MG PO TABS: 650.0000 mg | ORAL_TABLET | Freq: Four times a day (QID) | ORAL | Status: DC | PRN

## 2013-01-09 MED ORDER — TRAMADOL HCL 50 MG PO TABS: 50.0000 mg | ORAL_TABLET | Freq: Four times a day (QID) | ORAL | Status: DC | PRN

## 2013-01-09 MED ORDER — ACETAMINOPHEN 325 MG PO TABS
650.0000 mg | ORAL_TABLET | Freq: Once | ORAL | Status: AC
  Administered 2013-01-09: 650 mg via ORAL
  Filled 2013-01-09: qty 2

## 2013-01-09 NOTE — ED Notes (Signed)
Patient arrived by EMS from home. Patient c/o SOB for the past 3 days. Patient reports she was seen 4 days ago for the type of symptoms

## 2013-01-09 NOTE — ED Provider Notes (Signed)
CSN: [id number]     Arrival date & time 01/09/13  1650 History   First MD Initiated Contact with Patient 01/09/13 1656     Chief Complaint  Patient presents with  . Shortness of Breath    HPI The patient complains of moderate worsening shortness of breath over the last several days. The patient has a history of morbid obesity associated with chronic renal failure. The patient is not on dialysis. She states she has stage III kidney disease. Patient feels like her symptoms have been getting worse over the last few days. She feels like she has swelling in her arms and legs. Her normal activity he is to use a motorized wheelchair to get around. Patient feels like she gets more short of breath when even trying to do that now. She denies any fevers. She does have a cough and feels like she has some pain with coughing. She denies any fevers. She does have a headache. She does have chronic back pain. Patient is chronically on 2 L nasal cannula oxygen Past Medical History  Diagnosis Date  . Asthma   . GERD (gastroesophageal reflux disease)   . Hypertension   . Homelessness   . Low back pain   . 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 Arthritis   . Gout   . Hyperlipemia   . Morbid obesity     uses motor wheel chair  . MRSA (methicillin resistant Staphylococcus aureus)     states about a year ago   Past Surgical History  Procedure Laterality Date  . Tubal ligation    . Cesarean section     Family History  Problem Relation Age of Onset  . Asthma Mother   . Diabetes Father   . Heart disease Father   . Stroke Father    History  Substance Use Topics  . Smoking status: Former Smoker    Types: Cigarettes    Quit date: 03/25/2003  . Smokeless tobacco: Former Systems developer    Quit date: 05/27/1978  . Alcohol Use: No   OB History   Grav Para Term Preterm Abortions TAB SAB Ect Mult Living                 Review of Systems  All other systems reviewed and are  negative.    Allergies  Ibuprofen; Adhesive; Azithromycin; Doxycycline; Sulfa antibiotics; and Sulfamethoxazole-trimethoprim  Home Medications   Current Outpatient Rx  Name  Route  Sig  Dispense  Refill  . albuterol (PROVENTIL HFA) 108 (90 BASE) MCG/ACT inhaler   Inhalation   Inhale 2 puffs into the lungs every 4 (four) hours as needed for wheezing or shortness of breath. For asthma relief         . albuterol (PROVENTIL) (2.5 MG/3ML) 0.083% nebulizer solution   Nebulization   Take 3 mLs (2.5 mg total) by nebulization every 6 (six) hours as needed for wheezing.   75 mL   0   . allopurinol (ZYLOPRIM) 100 MG tablet   Oral   Take 100 mg by mouth every morning.          [name] amitriptyline (ELAVIL) 75 MG tablet   Oral   Take 1 tablet (75 mg total) by mouth at bedtime.   30 tablet   0   . ammonium lactate (AMLACTIN) 12 % cream   Topical   Apply 1 g topically daily as needed. Applies to back and legs.Marland KitchenMarland KitchenIn a thin layer on affected  area for dry patches.         [name] aspirin 325 MG tablet   Oral   Take 325 mg by mouth every evening.          [name] atorvastatin (LIPITOR) 20 MG tablet   Oral   Take 20 mg by mouth every morning.          . clonazePAM (KLONOPIN) 1 MG tablet   Oral   Take 1 mg by mouth 2 (two) times daily.         . cloNIDine (CATAPRES) 0.2 MG tablet   Oral   Take 0.2 mg by mouth 2 (two) times daily.         . diphenhydrAMINE (BENADRYL) 25 mg capsule   Oral   Take 25 mg by mouth every 6 (six) hours as needed for itching or allergies. For itching         . Emollient (EUCERIN) lotion   Topical   Apply topically 2 (two) times daily as needed. On affected areas for dry patches.         [name] guaiFENesin-dextromethorphan (ROBITUSSIN DM) 100-10 MG/5ML syrup   Oral   Take 5 mLs by mouth 3 (three) times daily as needed for cough.   118 mL   0   . HYDROcodone-acetaminophen (NORCO) 10-325 MG per tablet   Oral   Take 1 tablet by mouth every 6 (six) hours  as needed for pain.         [name] ipratropium (ATROVENT) 0.02 % nebulizer solution   Nebulization   Take 2.5 mLs (0.5 mg total) by nebulization every 4 (four) hours.   75 mL   1   . ketoconazole (NIZORAL) 2 % cream   Topical   Apply 1 application topically daily.          [name] ketoconazole (NIZORAL) 2 % shampoo   Topical   Apply 1 application topically 2 (two) times a week.         [name] lisinopril (PRINIVIL,ZESTRIL) 40 MG tablet   Oral   Take 40 mg by mouth daily.         [name] lubiprostone (AMITIZA) 24 MCG capsule   Oral   Take 24 mcg by mouth daily as needed for constipation.          [name] omega-3 acid ethyl esters (LOVAZA) 1 G capsule   Oral   Take 2 g by mouth 2 (two) times daily.         . pantoprazole (PROTONIX) 40 MG tablet   Oral   Take 40 mg by mouth every morning.          . promethazine (PHENERGAN) 25 MG tablet   Oral   Take 25 mg by mouth every 6 (six) hours as needed for nausea. For nausea/vomiting         . solifenacin (VESICARE) 5 MG tablet   Oral   Take 5 mg by mouth every morning.          . vitamin E 100 UNIT capsule   Oral   Take 100 Units by mouth every morning.         . traMADol (ULTRAM) 50 MG tablet   Oral   Take 1 tablet (50 mg total) by mouth every 6 (six) hours as needed for pain.   15 tablet   0    BP 168/84  Pulse 80  Resp 22  SpO2 96%  LMP 03/24/2009 Physical Exam  Nursing note and vitals  reviewed. Constitutional: She appears well-nourished. No distress.  Morbidly obese, patient smells of cigarette smoke  HENT:  Head: Normocephalic and atraumatic.  Right Ear: External ear normal.  Left Ear: External ear normal.  Eyes: Conjunctivae are normal. Right eye exhibits no discharge. Left eye exhibits no discharge. No scleral icterus.  Neck: Neck supple. No tracheal deviation present.  Cardiovascular: Normal rate, regular rhythm and intact distal pulses.   Pulmonary/Chest: Effort normal and breath sounds normal. No stridor. No  respiratory distress. She has no wheezes. She has no rales.  Distant breath sounds  Abdominal: Soft. Bowel sounds are normal. She exhibits no distension. There is no tenderness. There is no rebound and no guarding.  Musculoskeletal: She exhibits no edema and no tenderness.  No edema appreciated, the patient refers to swelling in her extremities and I only appreciate adipose tissue  Neurological: She is alert. She has normal strength. No sensory deficit. Cranial nerve deficit:  no gross defecits noted. She exhibits normal muscle tone. She displays no seizure activity. Coordination normal.  Skin: Skin is warm and dry. No rash noted. She is not diaphoretic.  Psychiatric: She has a normal mood and affect.    ED Course  Procedures (including critical care time) Labs Review Labs Reviewed  BASIC METABOLIC PANEL - Abnormal; Notable for the following:    Glucose, Bld 124 (*)    Creatinine, Ser 1.17 (*)    GFR calc non Af Amer 54 (*)    GFR calc Af Amer 63 (*)    All other components within normal limits  CBC - Abnormal; Notable for the following:    WBC 13.8 (*)    All other components within normal limits  PRO B NATRIURETIC PEPTIDE  POCT I-STAT TROPONIN I   Imaging Review Dg Chest 2 View  01/09/2013   CLINICAL DATA:  Shortness of breath, chest pain  EXAM: CHEST  2 VIEW  COMPARISON:  01/06/2013  FINDINGS: Cardiomegaly with vascular congestion. No overt edema. Right base atelectasis. No effusions. No acute bony abnormality.  IMPRESSION: Cardiomegaly with vascular congestion. Low lung volumes with right base atelectasis.   Electronically Signed   By: Rolm Baptise M.D.   On: 01/09/2013 17:50    EKG Interpretation     Ventricular Rate:  92 PR Interval:  156 QRS Duration: 100 QT Interval:  340 QTC Calculation: 421 R Axis:   34 Text Interpretation:  Sinus rhythm Consider left atrial enlargement Low voltage, precordial leads Nonspecific T abnormalities, diffuse leads , new since last tracing  Poor data quality            MDM   1. SOB (shortness of breath)   2. Morbid obesity    Pt does not have evidence of pulmonary edema.  She was told to stop her lasix recently.  I feel that she can resume that but the patient would rather speak with her PCP.  She feels that she has fluid on her body but I do not appreciate any significant peripheral edema.  Pt is morbidly obese so the exam is limited however I feel her symptoms are chronic and there is no acute sign of fluid overload, pna or PTX.  I doubt PE.  Pt requested medication for pain.  I did refill her medication as requested    Kathalene Frames, MD 01/09/13 1946

## 2013-01-09 NOTE — Progress Notes (Signed)
Patient has been provided discharge instructions along with prescriptions and verbalizes understanding.

## 2013-01-09 NOTE — ED Notes (Signed)
Bed: [id number] Expected date:  Expected time:  Means of arrival:  Comments: SOB

## 2013-02-25 ENCOUNTER — Emergency Department (INDEPENDENT_AMBULATORY_CARE_PROVIDER_SITE_OTHER)
Admission: EM | Admit: 2013-02-25 | Discharge: 2013-02-25 | Disposition: A | Discharge status: Home / Self Care | Payer: Medicaid Other | Source: Home / Self Care | Attending: Emergency Medicine | Admitting: Emergency Medicine

## 2013-02-25 ENCOUNTER — Encounter (HOSPITAL_COMMUNITY): Payer: Self-pay | Admitting: Emergency Medicine

## 2013-02-25 DIAGNOSIS — H6093 Unspecified otitis externa, bilateral: Secondary | ICD-10-CM

## 2013-02-25 DIAGNOSIS — H60399 Other infective otitis externa, unspecified ear: Secondary | ICD-10-CM

## 2013-02-25 MED ORDER — MOMETASONE FUROATE 0.1 % EX CREA: 1.0000 "application " | TOPICAL_CREAM | Freq: Every day | CUTANEOUS | Status: DC

## 2013-02-25 MED ORDER — HYDROCODONE-ACETAMINOPHEN 5-325 MG PO TABS: ORAL_TABLET | ORAL | Status: DC

## 2013-02-25 MED ORDER — CIPROFLOXACIN-HYDROCORTISONE 0.2-1 % OT SUSP: 3.0000 [drp] | Freq: Two times a day (BID) | OTIC | Status: DC

## 2013-02-25 NOTE — ED Notes (Signed)
C/o both ears hurt (R>L) x 1 week, HA x 1 week, generalized swelling x 1 month; reports she last spoke w her MD 1 month ago about her swelling, but it is no better

## 2013-02-25 NOTE — ED Provider Notes (Addendum)
Chief Complaint:   Chief Complaint  Patient presents with  . Otalgia    History of Present Illness:   Kara Vincent is a morbidly obese 47 year old female with multiple medical problems who presents with a 3 to four-day history of bilateral ear pain, worse on the right than the left. She's had chills, sore throat, and cough productive yellow sputum. She denies any fever, nasal congestion, or drainage.  Review of Systems:  Other than noted above, the patient denies any of the following symptoms: Systemic:  No fevers, chills, sweats, weight loss or gain, fatigue, or tiredness. Eye:  No redness, pain, discharge, itching, blurred vision, or diplopia. ENT:  No headache, nasal congestion, sneezing, itching, epistaxis, ear pain, congestion, decreased hearing, ringing in ears, vertigo, or tinnitus.  No oral lesions, sore throat, pain on swallowing, or hoarseness. Neck:  No mass, tenderness or adenopathy. Lungs:  No coughing, wheezing, or shortness of breath. Skin:  No rash or itching.  [place]:  Past medical history, family history, social history, meds, and allergies were reviewed. She has listed allergies to ibuprofen, adhesive, azithromycin, doxycycline, and sulfa. She has a long problem which includes asthma, sleep apnea, hypertension, gastroesophageal reflux, back pain, incontinence, chronic pain, morbid obesity, heart failure, and chronic kidney disease. Current medications include albuterol, allopurinol, Elavil, aspirin, Lipitor, clonazepam, Catapres, Atrovent by nebulizer, ketoconazole, lisinopril, hematochezia, Protonix, Phenergan, VESIcare, and tramadol.  Physical Exam:   Vital signs:  BP 134/72  Pulse 70  Temp(Src) 97.6 F (36.4 C) (Oral)  Resp 18  SpO2 97%  LMP 03/24/2009 General:  Alert and oriented.  In no distress.  Skin warm and dry. Eye:  PERRL, full EOMs, lids and conjunctiva normal.   ENT:  She has an extensive rash on her external ears with redness and scaling. This involves  the skin behind the ears extends up to the scalp as well. Ear canals are red and scaly. There's lots of debris. The TMs appear normal.  Nasal mucosa not congested and without drainage.  Mucous membranes moist, no oral lesions, normal dentition, pharynx clear.  No cranial or facial pain to palplation. Neck:  Supple, full ROM.  No adenopathy, tenderness or mass.  Thyroid normal. Lungs:  Breath sounds clear and equal bilaterally.  No wheezes, rales or rhonchi. Heart:  Rhythm regular, without extrasystoles.  No gallops or murmers. Skin:  Clear, warm and dry.  Assessment:  The encounter diagnosis was Otitis externa, bilateral.  She has a red scaly rash involving the external ears but also the scalp and the face as well. This may be eczema or seborrheic dermatitis. She appears to have chronic inflammation of her external ears bilaterally. I think she may have an acute on chronic external otitis.  Plan:   1.  Meds:  The following meds were prescribed:   Discharge Medication List as of 02/25/2013  8:21 PM    START taking these medications   Details  ciprofloxacin-hydrocortisone (CIPRO HC) otic suspension Place 3 drops into both ears 2 (two) times daily., Starting 02/25/2013, Until Discontinued, Print    HYDROcodone-acetaminophen (NORCO/VICODIN) 5-325 MG per tablet 1 to 2 tabs every 4 to 6 hours as needed for pain., Print    mometasone (ELOCON) 0.1 % cream Apply 1 application topically daily., Starting 02/25/2013, Until Discontinued, Print        2.  Patient Education/Counseling:  The patient was given appropriate handouts, self care instructions, and instructed in symptomatic relief.  Advised to avoid water in the ears. May apply  the mometasone cream to the outer ears once a day after bathing.  3.  Follow up:  The patient was told to follow up if no better in 3 to 4 days, if becoming worse in any way, and given some red flag symptoms such as worsening pain or fever which would prompt immediate  return.  Follow up here as needed.     Harden Mo, MD 02/25/13 2134  Addendum: The patient's pharmacy called back stating that the Cipro [facility] otic suspension would be too expensive for her to afford. In its place, a prescription was sent in for Cortisporin otic suspension, 3 drops in right ear 4 times a day.  Harden Mo, MD 02/28/13 [phone]

## 2013-02-28 MED ORDER — NEOMYCIN-POLYMYXIN-HC 3.5-10000-1 OT SUSP: 3.0000 [drp] | Freq: Four times a day (QID) | OTIC | Status: DC

## 2013-03-10 DIAGNOSIS — Z87891 Personal history of nicotine dependence: Secondary | ICD-10-CM | POA: Insufficient documentation

## 2013-03-10 DIAGNOSIS — E785 Hyperlipidemia, unspecified: Secondary | ICD-10-CM | POA: Insufficient documentation

## 2013-03-10 DIAGNOSIS — Z5900 Homelessness unspecified: Secondary | ICD-10-CM | POA: Insufficient documentation

## 2013-03-10 DIAGNOSIS — J45901 Unspecified asthma with (acute) exacerbation: Secondary | ICD-10-CM | POA: Insufficient documentation

## 2013-03-10 DIAGNOSIS — Z7982 Long term (current) use of aspirin: Secondary | ICD-10-CM | POA: Insufficient documentation

## 2013-03-10 DIAGNOSIS — Z8614 Personal history of Methicillin resistant Staphylococcus aureus infection: Secondary | ICD-10-CM | POA: Insufficient documentation

## 2013-03-10 DIAGNOSIS — M109 Gout, unspecified: Secondary | ICD-10-CM | POA: Insufficient documentation

## 2013-03-10 DIAGNOSIS — M129 Arthropathy, unspecified: Secondary | ICD-10-CM | POA: Insufficient documentation

## 2013-03-10 DIAGNOSIS — K219 Gastro-esophageal reflux disease without esophagitis: Secondary | ICD-10-CM | POA: Insufficient documentation

## 2013-03-10 DIAGNOSIS — Z79899 Other long term (current) drug therapy: Secondary | ICD-10-CM | POA: Insufficient documentation

## 2013-03-10 DIAGNOSIS — Q828 Other specified congenital malformations of skin: Secondary | ICD-10-CM | POA: Insufficient documentation

## 2013-03-10 DIAGNOSIS — L02419 Cutaneous abscess of limb, unspecified: Secondary | ICD-10-CM | POA: Insufficient documentation

## 2013-03-10 DIAGNOSIS — I10 Essential (primary) hypertension: Secondary | ICD-10-CM | POA: Insufficient documentation

## 2013-03-10 IMAGING — US US PELVIS COMPLETE
1 series · 14 of 25 positions shown · non-contrast
Comparison: None

CLINICAL DATA: Bleeding for 4 days

TRANSABDOMINAL AND TRANSVAGINAL ULTRASOUND OF PELVIS
TECHNIQUE: Both transabdominal and transvaginal ultrasound
examinations of the pelvis were performed. Transabdominal technique
was performed for global imaging of the pelvis including uterus,
ovaries, adnexal regions, and pelvic cul-de-sac.
It was necessary to proceed with endovaginal exam following the
transabdominal exam to visualize the uterus, endometrium and
ovaries. Transabdominal imaging limited by body habitus and
inadequate bladder distention

[Series 1: us transvaginal non-ob · 14 of 27 slices shown]
[im 1/27]
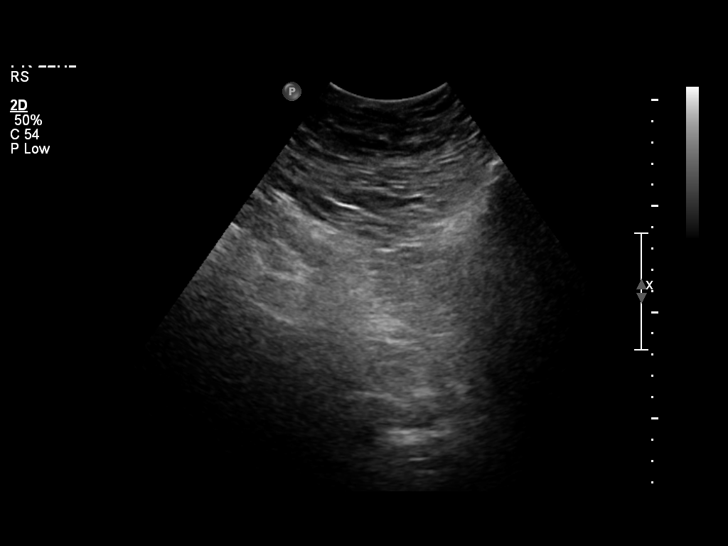
[im 3/27]
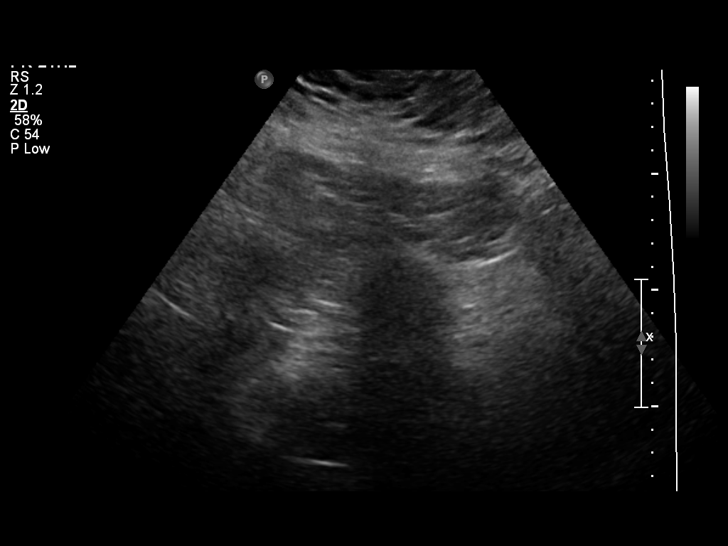
[im 5/27]
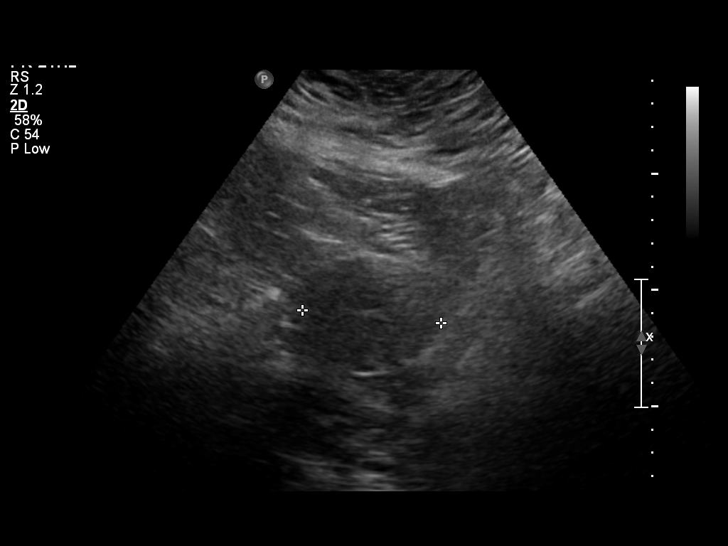
[im 7/27]
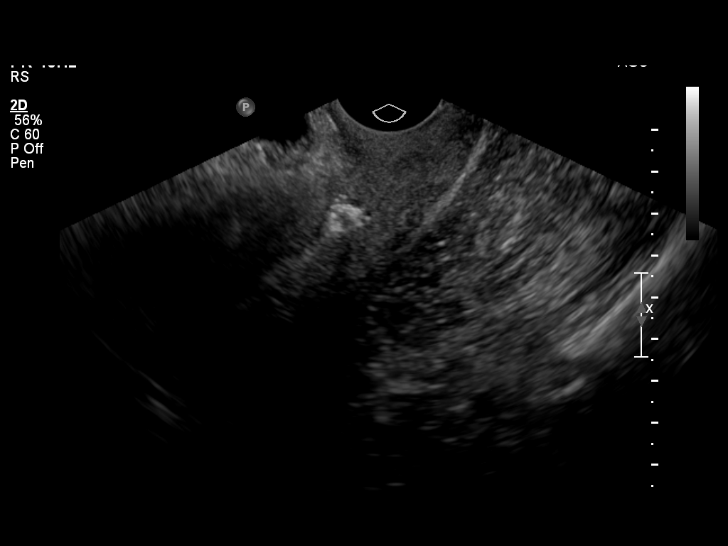
[im 9/27]
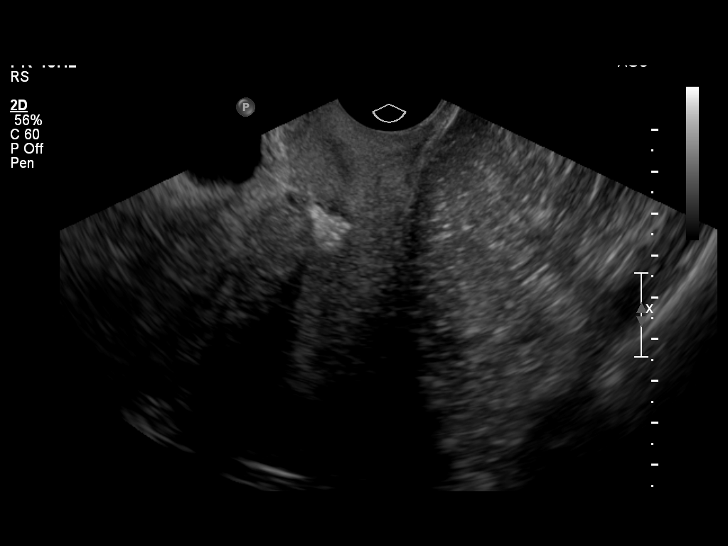
[im 10/27]
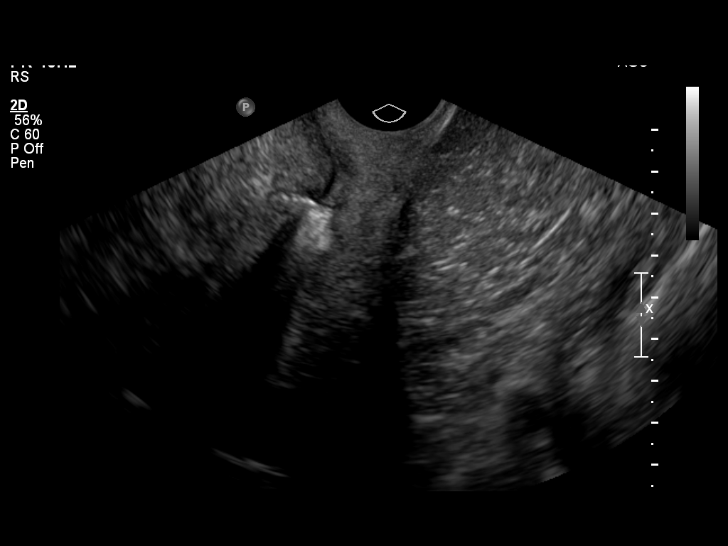
[im 12/27]
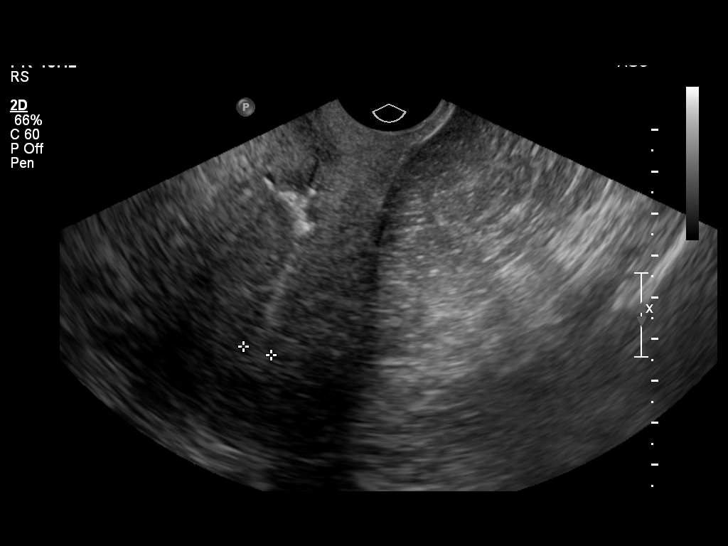
[im 15/27]
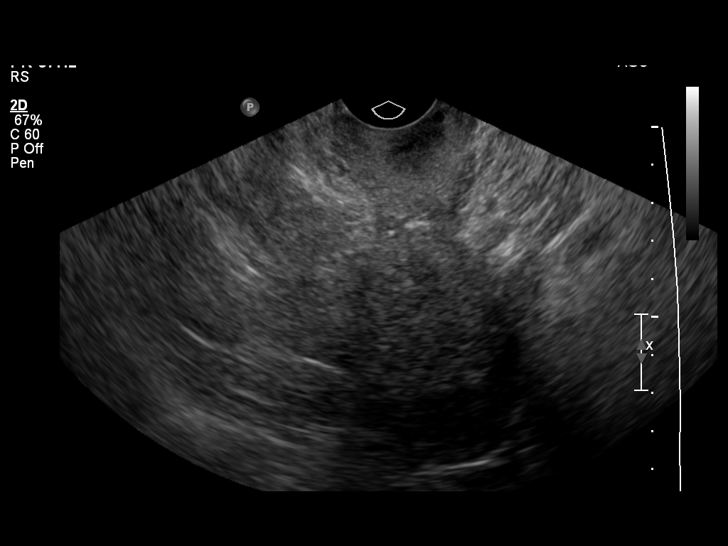
[im 17/27]
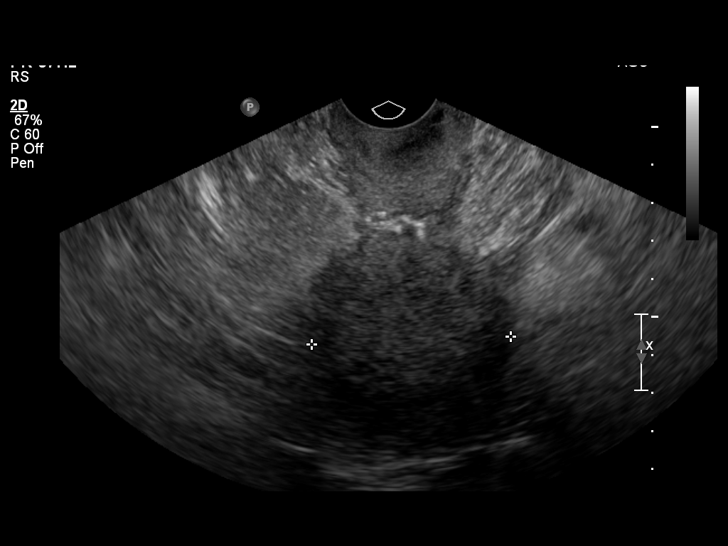
[im 18/27]
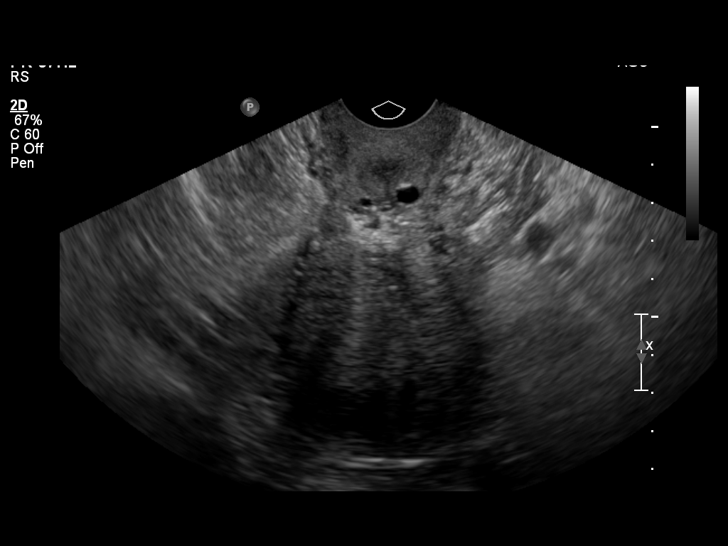
[im 20/27]
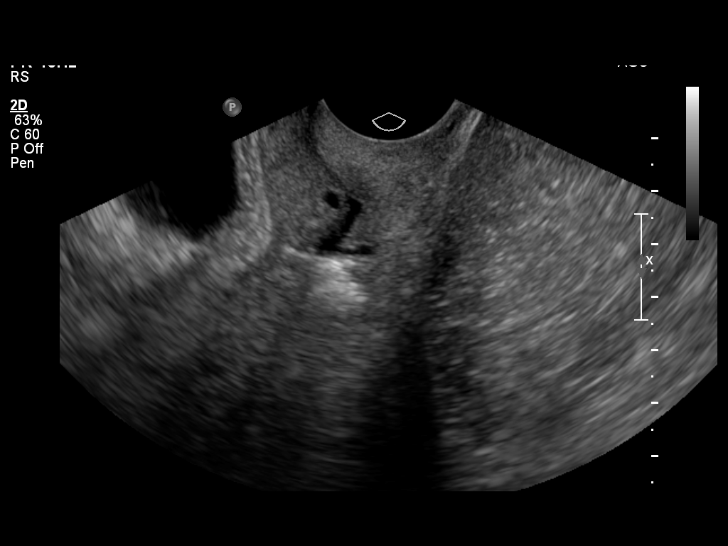
[im 22/27]
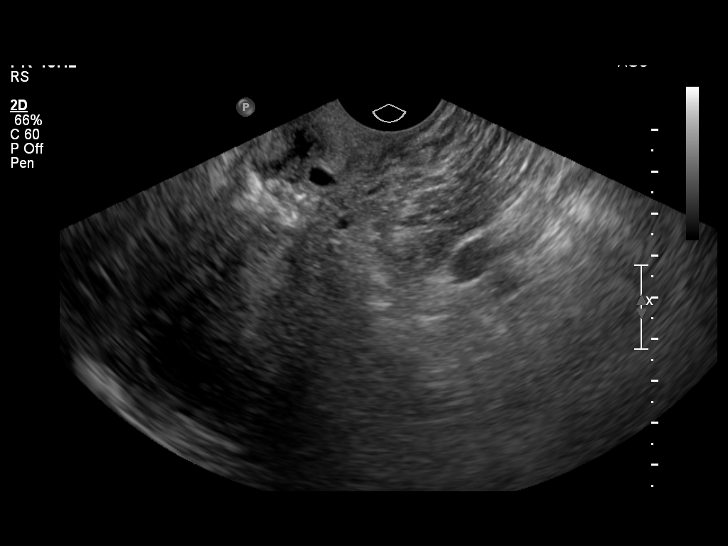
[im 24/27]
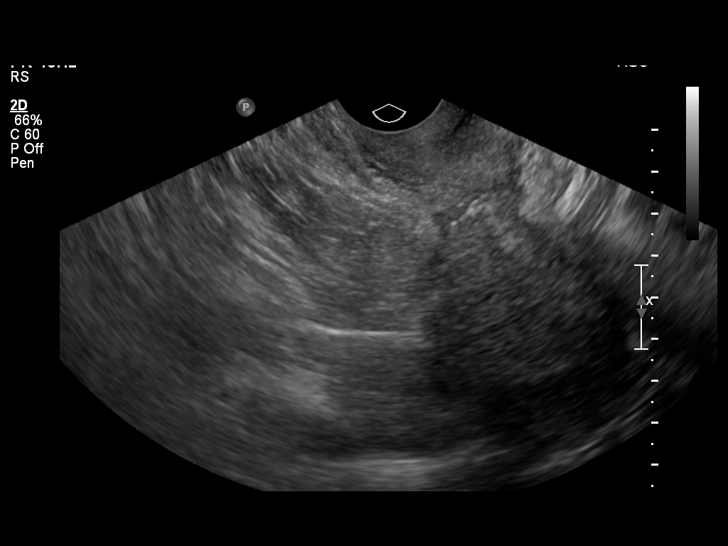
[im 27/27]
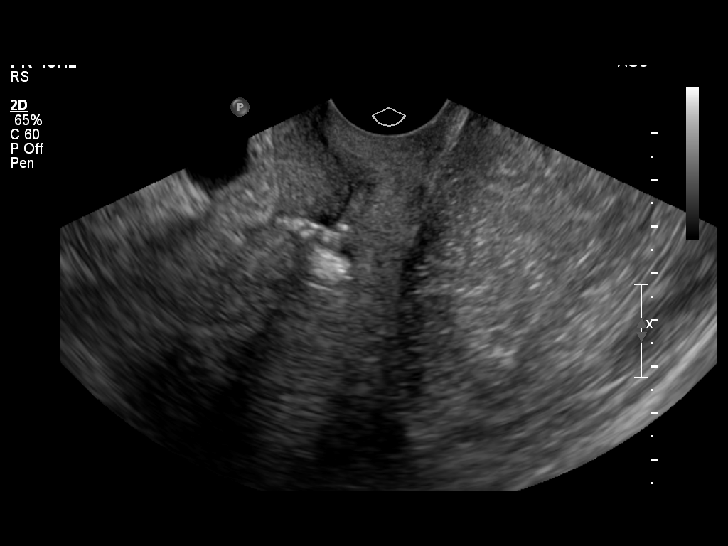

[14 of 25 positions shown; findings below may reference images not displayed]

FINDINGS: Uterus:  10.1 cm length by 4.7 cm AP by 5.2 cm transverse.  No
definite focal uterine mass.  Small Nabothian cyst at cervix.

Endometrium:  7 mm thick, normal.  No endometrial fluid.

Right ovary:  Not visualized on either transabdominal or
endovaginal images, suspect related to combination of body habitus
and pelvic bowel loops.

Left ovary:  Not visualized on either transabdominal or endovaginal
images, suspect related to combination of body habitus and pelvic
bowel loops.

Other findings:  No free fluid.
IMPRESSION: Unremarkable uterus.
Nonvisualization of ovaries.

## 2013-03-10 NOTE — ED Notes (Signed)
Patient arrives via EMS for multiple complaints: c/o productive cough, increased fluid retention, fatigue, sore of the back of lower right leg that she believes might be a spider bite Patient non-compliant with medical care and medications

## 2013-03-11 ENCOUNTER — Emergency Department (HOSPITAL_COMMUNITY)
Admission: EM | Admit: 2013-03-11 | Discharge: 2013-03-11 | Disposition: A | Discharge status: Home / Self Care | Payer: Medicaid Other | Attending: Emergency Medicine | Admitting: Emergency Medicine

## 2013-03-11 ENCOUNTER — Encounter (HOSPITAL_COMMUNITY): Payer: Self-pay | Admitting: Emergency Medicine

## 2013-03-11 DIAGNOSIS — J45909 Unspecified asthma, uncomplicated: Secondary | ICD-10-CM

## 2013-03-11 DIAGNOSIS — L0390 Cellulitis, unspecified: Secondary | ICD-10-CM

## 2013-03-11 MED ORDER — PREDNISONE 20 MG PO TABS: 40.0000 mg | ORAL_TABLET | Freq: Every day | ORAL | Status: DC

## 2013-03-11 MED ORDER — CLINDAMYCIN HCL 150 MG PO CAPS: 300.0000 mg | ORAL_CAPSULE | Freq: Three times a day (TID) | ORAL | Status: DC

## 2013-03-11 MED ORDER — ALBUTEROL SULFATE (5 MG/ML) 0.5% IN NEBU
5.0000 mg | INHALATION_SOLUTION | RESPIRATORY_TRACT | Status: AC
  Administered 2013-03-11: 5 mg via RESPIRATORY_TRACT
  Filled 2013-03-11: qty 1

## 2013-03-11 MED ORDER — CLINDAMYCIN HCL 300 MG PO CAPS
300.0000 mg | ORAL_CAPSULE | Freq: Once | ORAL | Status: AC
  Administered 2013-03-11: 300 mg via ORAL
  Filled 2013-03-11: qty 1

## 2013-03-11 NOTE — ED Notes (Signed)
Bed: WA15 Expected date:  Expected time:  Means of arrival:  Comments: EMS  

## 2013-03-11 NOTE — ED Provider Notes (Signed)
CSN: [id number]     Arrival date & time 03/10/13  2358 History   First MD Initiated Contact with Patient 03/11/13 0016     Chief Complaint  Patient presents with  . Cough  . Insect Bite    Spider bite to leg, see notes   (Consider location/radiation/quality/duration/timing/severity/associated sxs/prior Treatment) HPI Comments: 47 year old morbidly obese female who presents with a complaint of a rash to her lower extremities on the lower right leg as well as a mildly productive cough of yellow phlegm. She states that she has been coughing for weeks. This gets better when she uses her breathing treatments at home, she has no fevers or chills. She's also had a slight redness to her leg for the last 2 days. She thinks she may been bitten by an insect, this is tender to the touch but does not appear to be swollen on one side or the other. She does complain of having increased fluid retention and swelling all over her body, she has had this frequently in the past and has been prescribed Lasix from time to time.  She has been told by the cardiologist that she does not need to take Lasix per her report, she has been told by her family doctor that she can take it as needed. She has used it 20 mg twice a day for the last couple of days without improvement. An echocardiogram from earlier in the year showed normal ejection fraction with mild LVH, no signs of congestive heart failure, BMP drawn over the last year shows borderline elevation.  Patient is a 47 y.o. female presenting with cough. The history is provided by the patient and medical records.  Cough   Past Medical History  Diagnosis Date  . Asthma   . GERD (gastroesophageal reflux disease)   . Hypertension   . Homelessness   . Low back pain   . 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 Arthritis   . Gout   . Hyperlipemia   . Morbid obesity     uses motor wheel chair  . MRSA (methicillin resistant Staphylococcus aureus)     states  about a year ago   Past Surgical History  Procedure Laterality Date  . Tubal ligation    . Cesarean section     Family History  Problem Relation Age of Onset  . Asthma Mother   . Diabetes Father   . Heart disease Father   . Stroke Father    History  Substance Use Topics  . Smoking status: Former Smoker    Types: Cigarettes    Quit date: 03/25/2003  . Smokeless tobacco: Former Systems developer    Quit date: 05/27/1978  . Alcohol Use: No   OB History   Grav Para Term Preterm Abortions TAB SAB Ect Mult Living                 Review of Systems  Respiratory: Positive for cough.   All other systems reviewed and are negative.    Allergies  Ibuprofen; Adhesive; Azithromycin; Doxycycline; Sulfa antibiotics; and Sulfamethoxazole-trimethoprim  Home Medications   Current Outpatient Rx  Name  Route  Sig  Dispense  Refill  . acetaminophen (TYLENOL) 325 MG tablet   Oral   Take 2 tablets (650 mg total) by mouth every 6 (six) hours as needed for pain.   30 tablet   1   . albuterol (PROVENTIL HFA) 108 (90 BASE) MCG/ACT inhaler   Inhalation  Inhale 2 puffs into the lungs every 4 (four) hours as needed for wheezing or shortness of breath. For asthma relief         . albuterol (PROVENTIL) (2.5 MG/3ML) 0.083% nebulizer solution   Nebulization   Take 3 mLs (2.5 mg total) by nebulization every 6 (six) hours as needed for wheezing.   75 mL   0   . allopurinol (ZYLOPRIM) 100 MG tablet   Oral   Take 100 mg by mouth every morning.          [name] amitriptyline (ELAVIL) 75 MG tablet   Oral   Take 1 tablet (75 mg total) by mouth at bedtime.   30 tablet   0   . ammonium lactate (AMLACTIN) 12 % cream   Topical   Apply 1 g topically daily as needed. Applies to back and legs.Marland KitchenMarland KitchenIn a thin layer on affected area for dry patches.         [name] aspirin 325 MG tablet   Oral   Take 325 mg by mouth every evening.          [name] atorvastatin (LIPITOR) 20 MG tablet   Oral   Take 20 mg by mouth every  morning.          . ciprofloxacin-hydrocortisone (CIPRO HC) otic suspension   Both Ears   Place 3 drops into both ears 2 (two) times daily.   10 mL   0   . clindamycin (CLEOCIN) 150 MG capsule   Oral   Take 2 capsules (300 mg total) by mouth 3 (three) times daily. May dispense as 150mg  capsules   60 capsule   0   . clonazePAM (KLONOPIN) 1 MG tablet   Oral   Take 1 mg by mouth 2 (two) times daily.         . cloNIDine (CATAPRES) 0.2 MG tablet   Oral   Take 0.2 mg by mouth 2 (two) times daily.         . diphenhydrAMINE (BENADRYL) 25 mg capsule   Oral   Take 25 mg by mouth every 6 (six) hours as needed for itching or allergies. For itching         . Emollient (EUCERIN) lotion   Topical   Apply topically 2 (two) times daily as needed. On affected areas for dry patches.         [name] guaiFENesin-dextromethorphan (ROBITUSSIN DM) 100-10 MG/5ML syrup   Oral   Take 5 mLs by mouth 3 (three) times daily as needed for cough.   118 mL   0   . HYDROcodone-acetaminophen (NORCO) 10-325 MG per tablet   Oral   Take 1 tablet by mouth every 6 (six) hours as needed for pain.         [name] HYDROcodone-acetaminophen (NORCO/VICODIN) 5-325 MG per tablet      1 to 2 tabs every 4 to 6 hours as needed for pain.   20 tablet   0   . ipratropium (ATROVENT) 0.02 % nebulizer solution   Nebulization   Take 2.5 mLs (0.5 mg total) by nebulization every 4 (four) hours.   75 mL   1   . ketoconazole (NIZORAL) 2 % cream   Topical   Apply 1 application topically daily.          [name] ketoconazole (NIZORAL) 2 % shampoo   Topical   Apply 1 application topically 2 (two) times a week.         [name] lisinopril (PRINIVIL,ZESTRIL)  40 MG tablet   Oral   Take 40 mg by mouth daily.         [name] lubiprostone (AMITIZA) 24 MCG capsule   Oral   Take 24 mcg by mouth daily as needed for constipation.          . mometasone (ELOCON) 0.1 % cream   Topical   Apply 1 application topically daily.   45 g    0   . neomycin-polymyxin-hydrocortisone (CORTISPORIN) 3.5-10000-1 otic suspension   Right Ear   Place 3 drops into the right ear 4 (four) times daily.   10 mL   0   . omega-3 acid ethyl esters (LOVAZA) 1 G capsule   Oral   Take 2 g by mouth 2 (two) times daily.         . pantoprazole (PROTONIX) 40 MG tablet   Oral   Take 40 mg by mouth every morning.          . predniSONE (DELTASONE) 20 MG tablet   Oral   Take 2 tablets (40 mg total) by mouth daily.   10 tablet   0   . promethazine (PHENERGAN) 25 MG tablet   Oral   Take 25 mg by mouth every 6 (six) hours as needed for nausea. For nausea/vomiting         . solifenacin (VESICARE) 5 MG tablet   Oral   Take 5 mg by mouth every morning.          . traMADol (ULTRAM) 50 MG tablet   Oral   Take 1 tablet (50 mg total) by mouth every 6 (six) hours as needed for pain.   15 tablet   0   . vitamin E 100 UNIT capsule   Oral   Take 100 Units by mouth every morning.          BP 111/60  Pulse 79  Temp(Src) 98.6 F (37 C) (Oral)  Resp 20  SpO2 94%  LMP 03/24/2009 Physical Exam  Nursing note and vitals reviewed. Constitutional: She appears well-developed and well-nourished. No distress.  HENT:  Head: Normocephalic and atraumatic.  Mouth/Throat: Oropharynx is clear and moist. No oropharyngeal exudate.  Eyes: Conjunctivae and EOM are normal. Pupils are equal, round, and reactive to light. Right eye exhibits no discharge. Left eye exhibits no discharge. No scleral icterus.  Neck: Normal range of motion. Neck supple. No JVD present. No thyromegaly present.  Cardiovascular: Normal rate, regular rhythm, normal heart sounds and intact distal pulses.  Exam reveals no gallop and no friction rub.   No murmur heard. Pulmonary/Chest: Effort normal. No respiratory distress. She has wheezes ( Mild end expiratory wheezing in all lung fields, no respiratory distress, speaks in full sentences, no increased work of breathing or  accessory muscle use). She has no rales.  Abdominal: Soft. Bowel sounds are normal. She exhibits no distension and no mass. There is no tenderness.  Musculoskeletal: Normal range of motion. She exhibits tenderness (lower right leg - area of mild erythema and tenderness, approximately 2 cm in diameter, no induration or fluctuance). She exhibits no edema.  Lymphadenopathy:    She has no cervical adenopathy.  Neurological: She is alert. Coordination normal.  Skin: Skin is warm and dry. Rash (2 right lower extremity as described above) noted. No erythema.  Psychiatric: She has a normal mood and affect. Her behavior is normal.    ED Course  Procedures (including critical care time) Labs Review Labs Reviewed - No data to  display Imaging Review No results found.  EKG Interpretation   None       MDM   1. Cellulitis   2. RAD (reactive airway disease)    The patient has no significant asymmetry, no peripheral edema of the lower extremities though she does have one area that could be consistent with a cellulitis. Unsure the etiology, could have been insect bite, Coumadin ingrown hair, either way needs antibiotics. Otherwise lungs appear wheezy but no signs of rales, no asymmetry, speaking in full sentences without increased work of breathing. We'll give albuterol treatment, started on antibiotic for cellulitis and to cover some pulmonary pathogens. Suspect reactive airway disease the most likely culprit of her shortness of breath. Patient overall appears stable, can take increased dose of Lasix and followup.  Repeat O2 after neb:  94% - back to baseline.  Meds given in ED:  Medications  albuterol (PROVENTIL) (5 MG/ML) 0.5% nebulizer solution 5 mg (5 mg Nebulization Given 03/11/13 0053)  clindamycin (CLEOCIN) capsule 300 mg (300 mg Oral Given 03/11/13 0053)    New Prescriptions   CLINDAMYCIN (CLEOCIN) 150 MG CAPSULE    Take 2 capsules (300 mg total) by mouth 3 (three) times daily. May  dispense as 150mg  capsules   PREDNISONE (DELTASONE) 20 MG TABLET    Take 2 tablets (40 mg total) by mouth daily.        Johnna Acosta, MD 03/11/13 [id number]

## 2013-03-11 NOTE — ED Notes (Signed)
[facility] called for discharge.

## 2013-04-14 ENCOUNTER — Emergency Department (HOSPITAL_COMMUNITY)
Admission: EM | Admit: 2013-04-14 | Discharge: 2013-04-14 | Disposition: A | Discharge status: Home / Self Care | Payer: Medicaid Other | Attending: Emergency Medicine | Admitting: Emergency Medicine

## 2013-04-14 ENCOUNTER — Encounter (HOSPITAL_COMMUNITY): Payer: Self-pay | Admitting: Emergency Medicine

## 2013-04-14 ENCOUNTER — Emergency Department (HOSPITAL_COMMUNITY): Payer: Medicaid Other

## 2013-04-14 DIAGNOSIS — R0602 Shortness of breath: Secondary | ICD-10-CM

## 2013-04-14 DIAGNOSIS — Z7982 Long term (current) use of aspirin: Secondary | ICD-10-CM | POA: Insufficient documentation

## 2013-04-14 DIAGNOSIS — I10 Essential (primary) hypertension: Secondary | ICD-10-CM | POA: Insufficient documentation

## 2013-04-14 DIAGNOSIS — R509 Fever, unspecified: Secondary | ICD-10-CM | POA: Insufficient documentation

## 2013-04-14 DIAGNOSIS — Q828 Other specified congenital malformations of skin: Secondary | ICD-10-CM | POA: Insufficient documentation

## 2013-04-14 DIAGNOSIS — M7989 Other specified soft tissue disorders: Secondary | ICD-10-CM | POA: Insufficient documentation

## 2013-04-14 DIAGNOSIS — Z993 Dependence on wheelchair: Secondary | ICD-10-CM | POA: Insufficient documentation

## 2013-04-14 DIAGNOSIS — M109 Gout, unspecified: Secondary | ICD-10-CM | POA: Insufficient documentation

## 2013-04-14 DIAGNOSIS — Z5900 Homelessness unspecified: Secondary | ICD-10-CM | POA: Insufficient documentation

## 2013-04-14 DIAGNOSIS — M129 Arthropathy, unspecified: Secondary | ICD-10-CM | POA: Insufficient documentation

## 2013-04-14 DIAGNOSIS — R059 Cough, unspecified: Secondary | ICD-10-CM

## 2013-04-14 DIAGNOSIS — Z8614 Personal history of Methicillin resistant Staphylococcus aureus infection: Secondary | ICD-10-CM | POA: Insufficient documentation

## 2013-04-14 DIAGNOSIS — K219 Gastro-esophageal reflux disease without esophagitis: Secondary | ICD-10-CM | POA: Insufficient documentation

## 2013-04-14 DIAGNOSIS — R05 Cough: Secondary | ICD-10-CM

## 2013-04-14 DIAGNOSIS — J45901 Unspecified asthma with (acute) exacerbation: Secondary | ICD-10-CM | POA: Insufficient documentation

## 2013-04-14 DIAGNOSIS — Z87891 Personal history of nicotine dependence: Secondary | ICD-10-CM | POA: Insufficient documentation

## 2013-04-14 DIAGNOSIS — Z9981 Dependence on supplemental oxygen: Secondary | ICD-10-CM | POA: Insufficient documentation

## 2013-04-14 DIAGNOSIS — IMO0002 Reserved for concepts with insufficient information to code with codable children: Secondary | ICD-10-CM | POA: Insufficient documentation

## 2013-04-14 DIAGNOSIS — Q798 Other congenital malformations of musculoskeletal system: Secondary | ICD-10-CM | POA: Insufficient documentation

## 2013-04-14 DIAGNOSIS — E785 Hyperlipidemia, unspecified: Secondary | ICD-10-CM | POA: Insufficient documentation

## 2013-04-14 DIAGNOSIS — Z79899 Other long term (current) drug therapy: Secondary | ICD-10-CM | POA: Insufficient documentation

## 2013-04-14 DIAGNOSIS — Z792 Long term (current) use of antibiotics: Secondary | ICD-10-CM | POA: Insufficient documentation

## 2013-04-14 LAB — CBC WITH DIFFERENTIAL/PLATELET
Basophils Absolute: 0 10*3/uL (ref 0.0–0.1)
Basophils Relative: 0 % (ref 0–1)
EOS PCT: 2 % (ref 0–5)
Eosinophils Absolute: 0.3 10*3/uL (ref 0.0–0.7)
HEMATOCRIT: 41.8 % (ref 36.0–46.0)
HEMOGLOBIN: 12.8 g/dL (ref 12.0–15.0)
LYMPHS ABS: 3 10*3/uL (ref 0.7–4.0)
Lymphocytes Relative: 25 % (ref 12–46)
MCH: 28.6 pg (ref 26.0–34.0)
MCHC: 30.6 g/dL (ref 30.0–36.0)
MCV: 93.5 fL (ref 78.0–100.0)
MONO ABS: 0.6 10*3/uL (ref 0.1–1.0)
MONOS PCT: 5 % (ref 3–12)
NEUTROS ABS: 8.1 10*3/uL — AB (ref 1.7–7.7)
Neutrophils Relative %: 67 % (ref 43–77)
Platelets: 235 10*3/uL (ref 150–400)
RBC: 4.47 MIL/uL (ref 3.87–5.11)
RDW: 14.9 % (ref 11.5–15.5)
WBC: 12 10*3/uL — AB (ref 4.0–10.5)

## 2013-04-14 LAB — POCT I-STAT TROPONIN I: Troponin i, poc: 0 ng/mL (ref 0.00–0.08)

## 2013-04-14 LAB — BASIC METABOLIC PANEL
BUN: 18 mg/dL (ref 6–23)
CALCIUM: 8.6 mg/dL (ref 8.4–10.5)
CO2: 32 mEq/L (ref 19–32)
CREATININE: 1 mg/dL (ref 0.50–1.10)
Chloride: 101 mEq/L (ref 96–112)
GFR calc Af Amer: 75 mL/min — ABNORMAL LOW (ref >90)
GFR calc non Af Amer: 65 mL/min — ABNORMAL LOW (ref >90)
GLUCOSE: 118 mg/dL — AB (ref 70–99)
Potassium: 4.2 mEq/L (ref 3.7–5.3)
Sodium: 145 mEq/L (ref 137–147)

## 2013-04-14 LAB — PRO B NATRIURETIC PEPTIDE: PRO B NATRI PEPTIDE: 50.3 pg/mL (ref 0–125)

## 2013-04-14 MED ORDER — ACETAMINOPHEN 325 MG PO TABS
650.0000 mg | ORAL_TABLET | Freq: Once | ORAL | Status: AC
  Administered 2013-04-14: 650 mg via ORAL
  Filled 2013-04-14: qty 2

## 2013-04-14 MED ORDER — ALBUTEROL SULFATE (2.5 MG/3ML) 0.083% IN NEBU
5.0000 mg | INHALATION_SOLUTION | Freq: Once | RESPIRATORY_TRACT | Status: AC
  Administered 2013-04-14: 5 mg via RESPIRATORY_TRACT
  Filled 2013-04-14: qty 6

## 2013-04-14 MED ORDER — IPRATROPIUM BROMIDE 0.02 % IN SOLN
0.5000 mg | Freq: Once | RESPIRATORY_TRACT | Status: AC
  Administered 2013-04-14: 0.5 mg via RESPIRATORY_TRACT
  Filled 2013-04-14: qty 2.5

## 2013-04-14 MED ORDER — BENZONATATE 100 MG PO CAPS: 100.0000 mg | ORAL_CAPSULE | Freq: Three times a day (TID) | ORAL | Status: DC

## 2013-04-14 MED ORDER — METHYLPREDNISOLONE SODIUM SUCC 125 MG IJ SOLR
125.0000 mg | Freq: Once | INTRAMUSCULAR | Status: AC
  Administered 2013-04-14: 125 mg via INTRAVENOUS
  Filled 2013-04-14: qty 2

## 2013-04-14 MED ORDER — PREDNISONE 20 MG PO TABS: 40.0000 mg | ORAL_TABLET | Freq: Every day | ORAL | Status: DC

## 2013-04-14 NOTE — ED Notes (Signed)
Pt c/o SOB with expiratory wheeze, productive cough, and fever  x 1 week. EMS administered Albuterol 5 mg x 1 prior to arrival.

## 2013-04-14 NOTE — ED Provider Notes (Signed)
CSN: [id number]     Arrival date & time 04/14/13  [id number] History   None    Chief Complaint  Patient presents with  . Shortness of Breath   (Consider location/radiation/quality/duration/timing/severity/associated sxs/prior Treatment) The history is provided by the patient and medical records.   This is a 48 y.o. F with PMH significant for HTN, asthma, GERD, HLP, obesity, presenting to the ED for SOB associated with productive cough with thick yellow sputum and subjective fever for the past 5 days.  Symptoms worse with exertional activities and with lying on her left side as this causing some pain with breathing. Pt admits to recent sick contacts with similar sx.  Pt did not receive a flu shot this year-- states she never gets them.  Pt also notes some swelling of her BLE.  States she was told in the past that she had CHF but does not follow with a cardiologist regularly and is not currently on Lasix.  No prior hx of DVT or PE.  Denies chest pain, palpitations, dizziness, weakness, or diaphoresis.    Pt states she is on 2L via [place] at home at all times, CPAP at night.  VS stable on arrival-- O2 sats 96% on RA.  Past Medical History  Diagnosis Date  . Asthma   . GERD (gastroesophageal reflux disease)   . Hypertension   . Homelessness   . Low back pain   . 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 Arthritis   . Gout   . Hyperlipemia   . Morbid obesity     uses motor wheel chair  . MRSA (methicillin resistant Staphylococcus aureus)     states about a year ago   Past Surgical History  Procedure Laterality Date  . Tubal ligation    . Cesarean section     Family History  Problem Relation Age of Onset  . Asthma Mother   . Diabetes Father   . Heart disease Father   . Stroke Father    History  Substance Use Topics  . Smoking status: Former Smoker    Types: Cigarettes    Quit date: 03/25/2003  . Smokeless tobacco: Former Systems developer    Quit date: 05/27/1978  . Alcohol Use: No    OB History   Grav Para Term Preterm Abortions TAB SAB Ect Mult Living                 Review of Systems  Constitutional: Positive for fever.  Respiratory: Positive for cough and shortness of breath.   Cardiovascular: Positive for leg swelling.  All other systems reviewed and are negative.    Allergies  Ibuprofen; Adhesive; Azithromycin; Doxycycline; Sulfa antibiotics; and Sulfamethoxazole-trimethoprim  Home Medications   Current Outpatient Rx  Name  Route  Sig  Dispense  Refill  . acetaminophen (TYLENOL) 325 MG tablet   Oral   Take 2 tablets (650 mg total) by mouth every 6 (six) hours as needed for pain.   30 tablet   1   . albuterol (PROVENTIL HFA) 108 (90 BASE) MCG/ACT inhaler   Inhalation   Inhale 2 puffs into the lungs every 4 (four) hours as needed for wheezing or shortness of breath. For asthma relief         . albuterol (PROVENTIL) (2.5 MG/3ML) 0.083% nebulizer solution   Nebulization   Take 3 mLs (2.5 mg total) by nebulization every 6 (six) hours as needed for wheezing.   75 mL  0   . allopurinol (ZYLOPRIM) 100 MG tablet   Oral   Take 100 mg by mouth every morning.          [name] amitriptyline (ELAVIL) 75 MG tablet   Oral   Take 1 tablet (75 mg total) by mouth at bedtime.   30 tablet   0   . ammonium lactate (AMLACTIN) 12 % cream   Topical   Apply 1 g topically daily as needed. Applies to back and legs.Marland KitchenMarland KitchenIn a thin layer on affected area for dry patches.         [name] aspirin 325 MG tablet   Oral   Take 325 mg by mouth every evening.          [name] atorvastatin (LIPITOR) 20 MG tablet   Oral   Take 20 mg by mouth every morning.          . ciprofloxacin-hydrocortisone (CIPRO HC) otic suspension   Both Ears   Place 3 drops into both ears 2 (two) times daily.   10 mL   0   . clindamycin (CLEOCIN) 150 MG capsule   Oral   Take 2 capsules (300 mg total) by mouth 3 (three) times daily. May dispense as 150mg  capsules   60 capsule   0   .  clonazePAM (KLONOPIN) 1 MG tablet   Oral   Take 1 mg by mouth 2 (two) times daily.         . cloNIDine (CATAPRES) 0.2 MG tablet   Oral   Take 0.2 mg by mouth 2 (two) times daily.         . diphenhydrAMINE (BENADRYL) 25 mg capsule   Oral   Take 25 mg by mouth every 6 (six) hours as needed for itching or allergies. For itching         . Emollient (EUCERIN) lotion   Topical   Apply topically 2 (two) times daily as needed. On affected areas for dry patches.         [name] guaiFENesin-dextromethorphan (ROBITUSSIN DM) 100-10 MG/5ML syrup   Oral   Take 5 mLs by mouth 3 (three) times daily as needed for cough.   118 mL   0   . HYDROcodone-acetaminophen (NORCO) 10-325 MG per tablet   Oral   Take 1 tablet by mouth every 6 (six) hours as needed for pain.         [name] ipratropium (ATROVENT) 0.02 % nebulizer solution   Nebulization   Take 2.5 mLs (0.5 mg total) by nebulization every 4 (four) hours.   75 mL   1   . ketoconazole (NIZORAL) 2 % cream   Topical   Apply 1 application topically daily.          [name] ketoconazole (NIZORAL) 2 % shampoo   Topical   Apply 1 application topically 2 (two) times a week.         [name] lisinopril (PRINIVIL,ZESTRIL) 40 MG tablet   Oral   Take 40 mg by mouth daily.         [name] lubiprostone (AMITIZA) 24 MCG capsule   Oral   Take 24 mcg by mouth daily as needed for constipation.          . mometasone (ELOCON) 0.1 % cream   Topical   Apply 1 application topically daily.   45 g   0   . neomycin-polymyxin-hydrocortisone (CORTISPORIN) 3.5-10000-1 otic suspension   Right Ear   Place 3 drops into the right ear 4 (four)  times daily.   10 mL   0   . omega-3 acid ethyl esters (LOVAZA) 1 G capsule   Oral   Take 2 g by mouth 2 (two) times daily.         . pantoprazole (PROTONIX) 40 MG tablet   Oral   Take 40 mg by mouth every morning.          . predniSONE (DELTASONE) 20 MG tablet   Oral   Take 2 tablets (40 mg total) by mouth daily.    10 tablet   0   . promethazine (PHENERGAN) 25 MG tablet   Oral   Take 25 mg by mouth every 6 (six) hours as needed for nausea. For nausea/vomiting         . solifenacin (VESICARE) 5 MG tablet   Oral   Take 5 mg by mouth every morning.          . traMADol (ULTRAM) 50 MG tablet   Oral   Take 1 tablet (50 mg total) by mouth every 6 (six) hours as needed for pain.   15 tablet   0   . vitamin E 100 UNIT capsule   Oral   Take 100 Units by mouth every morning.          BP 124/53  Pulse 87  Temp(Src) 98.4 F (36.9 C) (Oral)  SpO2 96%  LMP 03/24/2009  Physical Exam  Nursing note and vitals reviewed. Constitutional: She is oriented to person, place, and time. She appears well-developed and well-nourished. No distress.  Morbidly obese  HENT:  Head: Normocephalic and atraumatic.  Right Ear: Tympanic membrane and ear canal normal.  Left Ear: Tympanic membrane and ear canal normal.  Nose: Nose normal.  Mouth/Throat: Uvula is midline, oropharynx is clear and moist and mucous membranes are normal. No oropharyngeal exudate, posterior oropharyngeal edema, posterior oropharyngeal erythema or tonsillar abscesses.  Eyes: Conjunctivae and EOM are normal. Pupils are equal, round, and reactive to light.  Neck: Normal range of motion. Neck supple.  Cardiovascular: Normal rate, regular rhythm and normal heart sounds.   Pulmonary/Chest: Accessory muscle usage present. No respiratory distress. She has no decreased breath sounds. She has wheezes.  Increased work of breathing with accessory muscle use; Significant expiratory wheezes in all lung fields; speaking in short sentences  Abdominal: Soft. Bowel sounds are normal. There is no tenderness. There is no guarding.  Musculoskeletal: Normal range of motion.  No pitting edema of LE; no calf asymmetry or palpable cord; overlying skin non-erythematous; negative Homan's sign bilaterally  Neurological: She is alert and oriented to person, place,  and time.  Skin: Skin is warm and dry. She is not diaphoretic.  Psychiatric: She has a normal mood and affect.    ED Course  Procedures (including critical care time)   Date: 04/14/2013  Rate: 72  Rhythm: normal sinus rhythm  QRS Axis: normal  Intervals: normal  ST/T Wave abnormalities: normal  Conduction Disutrbances:none  Narrative Interpretation: NSR  Old EKG Reviewed: unchanged   Labs Review Labs Reviewed  CBC WITH DIFFERENTIAL - Abnormal; Notable for the following:    WBC 12.0 (*)    Neutro Abs 8.1 (*)    All other components within normal limits  BASIC METABOLIC PANEL - Abnormal; Notable for the following:    Glucose, Bld 118 (*)    GFR calc non Af Amer 65 (*)    GFR calc Af Amer 75 (*)    All other components within normal limits  PRO B NATRIURETIC PEPTIDE  POCT I-STAT TROPONIN I   Imaging Review Dg Chest 2 View  04/14/2013   CLINICAL DATA:  Shortness breath. Morbid obesity. High blood pressure.  EXAM: CHEST  2 VIEW  COMPARISON:  01/09/2013.  FINDINGS: Cardiomegaly.  Slightly poor inspiration.  Pulmonary vascular congestion.  No segmental consolidation or pneumothorax.  Mediastinum similar to prior exam with limited evaluation secondary to central pulmonary vascular prominence and cardiomegaly. Slightly tortuous ascending thoracic aorta the suspected.  Mild degenerative changes thoracic spine.  IMPRESSION: Slightly poor inspiratory exam with cardiomegaly and pulmonary vascular congestion. Appearance similar to prior exam. Please see above.   Electronically Signed   By: Chauncey Cruel M.D.   On: 04/14/2013 07:56    EKG Interpretation   None       MDM   1. Shortness of breath   2. Cough    EKG NSR, no acute ischemic changes. Trop negative.  Labs as above-- BNP WNL.  CXR with some pulmonary vascular congestion without frank edema or noted infiltrate.  Repeat lung exam after nebs and solu-medrol is clear.  Pt is usually on 2L O2 at home, here in the ED O2 saturation  has remained stable on RA.  At this time i have low suspicion for ACS, PE, dissection, or other acute cardiac event.  Pt afebrile, non-toxic appearing, NAD, VS stable-- at this time do not feel there is a need for hospital admission and pt will be discharged home.  Advised she may wish to increase her home O2 for the next few days, specifically with activity to prevent SOB.  Rx tessalon perles and short course of prednisone.  Pt continually asking for pain medication-- she receives monthly prescriptions of vicodin 10mg  by her PCP, last filled on 04/01/13 and she will not receive further narcotics today.  She will FU with her PCP next week for re-check.  Discussed plan with pt, she agreed.  Return precautions advised.  Larene Pickett, PA-C 04/14/13 1015

## 2013-04-14 NOTE — Discharge Instructions (Signed)
Take the prescribed medication as directed-- start prednisone tomorrow, you have already had today's dose. May need to increase home oxygen to 3-4 liters, especially during activity, until your symptoms start to resolve. Follow up with Dr. [name] for re-check in a few days. Return to the ED for new or worsening symptoms.

## 2013-04-14 NOTE — ED Provider Notes (Signed)
Medical screening examination/treatment/procedure(s) were performed by non-physician practitioner and as supervising physician I was immediately available for consultation/collaboration.  EKG Interpretation   None         Blanchard Kelch, MD 04/14/13 1605

## 2013-06-02 ENCOUNTER — Emergency Department (HOSPITAL_COMMUNITY): Payer: Medicaid Other

## 2013-06-02 ENCOUNTER — Encounter (HOSPITAL_COMMUNITY): Payer: Self-pay | Admitting: Emergency Medicine

## 2013-06-02 ENCOUNTER — Emergency Department (HOSPITAL_COMMUNITY)
Admission: EM | Admit: 2013-06-02 | Discharge: 2013-06-03 | Disposition: A | Discharge status: Home / Self Care | Payer: Medicaid Other | Attending: Emergency Medicine | Admitting: Emergency Medicine

## 2013-06-02 DIAGNOSIS — I10 Essential (primary) hypertension: Secondary | ICD-10-CM | POA: Insufficient documentation

## 2013-06-02 DIAGNOSIS — R0600 Dyspnea, unspecified: Secondary | ICD-10-CM

## 2013-06-02 DIAGNOSIS — Z5900 Homelessness unspecified: Secondary | ICD-10-CM | POA: Insufficient documentation

## 2013-06-02 DIAGNOSIS — Z79899 Other long term (current) drug therapy: Secondary | ICD-10-CM | POA: Insufficient documentation

## 2013-06-02 DIAGNOSIS — R21 Rash and other nonspecific skin eruption: Secondary | ICD-10-CM | POA: Insufficient documentation

## 2013-06-02 DIAGNOSIS — M109 Gout, unspecified: Secondary | ICD-10-CM | POA: Insufficient documentation

## 2013-06-02 DIAGNOSIS — K219 Gastro-esophageal reflux disease without esophagitis: Secondary | ICD-10-CM | POA: Insufficient documentation

## 2013-06-02 DIAGNOSIS — Z87891 Personal history of nicotine dependence: Secondary | ICD-10-CM | POA: Insufficient documentation

## 2013-06-02 DIAGNOSIS — IMO0002 Reserved for concepts with insufficient information to code with codable children: Secondary | ICD-10-CM | POA: Insufficient documentation

## 2013-06-02 DIAGNOSIS — Q828 Other specified congenital malformations of skin: Secondary | ICD-10-CM | POA: Insufficient documentation

## 2013-06-02 DIAGNOSIS — E785 Hyperlipidemia, unspecified: Secondary | ICD-10-CM | POA: Insufficient documentation

## 2013-06-02 DIAGNOSIS — Z8614 Personal history of Methicillin resistant Staphylococcus aureus infection: Secondary | ICD-10-CM | POA: Insufficient documentation

## 2013-06-02 DIAGNOSIS — R609 Edema, unspecified: Secondary | ICD-10-CM | POA: Insufficient documentation

## 2013-06-02 DIAGNOSIS — Z7982 Long term (current) use of aspirin: Secondary | ICD-10-CM | POA: Insufficient documentation

## 2013-06-02 DIAGNOSIS — J45901 Unspecified asthma with (acute) exacerbation: Secondary | ICD-10-CM | POA: Insufficient documentation

## 2013-06-02 LAB — CBC
HEMATOCRIT: 41.4 % (ref 36.0–46.0)
Hemoglobin: 12.6 g/dL (ref 12.0–15.0)
MCH: 28.9 pg (ref 26.0–34.0)
MCHC: 30.4 g/dL (ref 30.0–36.0)
MCV: 95 fL (ref 78.0–100.0)
Platelets: 238 10*3/uL (ref 150–400)
RBC: 4.36 MIL/uL (ref 3.87–5.11)
RDW: 14.7 % (ref 11.5–15.5)
WBC: 9.4 10*3/uL (ref 4.0–10.5)

## 2013-06-02 LAB — BASIC METABOLIC PANEL
BUN: 15 mg/dL (ref 6–23)
CO2: 36 mEq/L — ABNORMAL HIGH (ref 19–32)
Calcium: 8.9 mg/dL (ref 8.4–10.5)
Chloride: 96 mEq/L (ref 96–112)
Creatinine, Ser: 1.06 mg/dL (ref 0.50–1.10)
GFR calc non Af Amer: 61 mL/min — ABNORMAL LOW (ref >90)
GFR, EST AFRICAN AMERICAN: 70 mL/min — AB (ref >90)
Glucose, Bld: 84 mg/dL (ref 70–99)
POTASSIUM: 4.1 meq/L (ref 3.7–5.3)
Sodium: 141 mEq/L (ref 137–147)

## 2013-06-02 LAB — PRO B NATRIURETIC PEPTIDE: PRO B NATRI PEPTIDE: 97.7 pg/mL (ref 0–125)

## 2013-06-02 LAB — I-STAT TROPONIN, ED: Troponin i, poc: 0 ng/mL (ref 0.00–0.08)

## 2013-06-02 MED ORDER — FUROSEMIDE 10 MG/ML IJ SOLN
40.0000 mg | Freq: Once | INTRAMUSCULAR | Status: AC
  Administered 2013-06-03: 40 mg via INTRAVENOUS
  Filled 2013-06-02: qty 4

## 2013-06-02 NOTE — ED Notes (Signed)
Pt states bilateral leg swelling >4 days-states she has generalized rash over her body-noted multiple scabbed areas to arms from itching/states increased shortness of breath over the past week and states both of her ears hurt-also states "my lungs and chest hurt too"-pain in both legs from the swelling.  Pt states she is oxygen dependent/2l or > as needed-for asthma and sleep apnea

## 2013-06-03 MED ORDER — DIPHENHYDRAMINE HCL 25 MG PO CAPS
25.0000 mg | ORAL_CAPSULE | Freq: Once | ORAL | Status: AC
  Administered 2013-06-03: 25 mg via ORAL
  Filled 2013-06-03: qty 1

## 2013-06-03 MED ORDER — HYDROCODONE-ACETAMINOPHEN 5-325 MG PO TABS: 1.0000 | ORAL_TABLET | Freq: Four times a day (QID) | ORAL | Status: DC | PRN

## 2013-06-03 MED ORDER — PERMETHRIN 5 % EX CREA: TOPICAL_CREAM | CUTANEOUS | Status: DC

## 2013-06-03 MED ORDER — OXYCODONE-ACETAMINOPHEN 5-325 MG PO TABS
2.0000 | ORAL_TABLET | Freq: Once | ORAL | Status: AC
  Administered 2013-06-03: 2 via ORAL
  Filled 2013-06-03: qty 2

## 2013-06-03 MED ORDER — DIPHENHYDRAMINE HCL 25 MG PO CAPS: 25.0000 mg | ORAL_CAPSULE | Freq: Four times a day (QID) | ORAL | Status: DC | PRN

## 2013-06-03 NOTE — ED Provider Notes (Signed)
CSN: [id number]     Arrival date & time 06/02/13  2112 History   First MD Initiated Contact with Patient 06/02/13 2304     Chief Complaint  Patient presents with  . Leg Swelling  . Shortness of Breath  . generalized rash     (Consider location/radiation/quality/duration/timing/severity/associated sxs/prior Treatment) HPI Comments: Morbidly obese patient with hx of asthma, HTN comes in with cc of dib and rash. Pt states that she has been having some dib x 2-3 days, getting worse and leg swelling .She ha hx of asthma. No wheezing, but + cough. Pt has no hx of DVT, PE, she denies orthopnea, pnd. No chest pain. Pt also has a body rash. Diffuse. She has no hx of allergies. The rash is itchy.   Patient is a 48 y.o. female presenting with shortness of breath. The history is provided by the patient.  Shortness of Breath Associated symptoms: cough and rash   Associated symptoms: no abdominal pain, no chest pain, no headaches, no neck pain, no vomiting and no wheezing     Past Medical History  Diagnosis Date  . Asthma   . GERD (gastroesophageal reflux disease)   . Hypertension   . Homelessness   . Low back pain   . 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 Arthritis   . Gout   . Hyperlipemia   . Morbid obesity     uses motor wheel chair  . MRSA (methicillin resistant Staphylococcus aureus)     states about a year ago   Past Surgical History  Procedure Laterality Date  . Tubal ligation    . Cesarean section     Family History  Problem Relation Age of Onset  . Asthma Mother   . Diabetes Father   . Heart disease Father   . Stroke Father    History  Substance Use Topics  . Smoking status: Former Smoker    Types: Cigarettes    Quit date: 03/25/2003  . Smokeless tobacco: Former Systems developer    Quit date: 05/27/1978  . Alcohol Use: No   OB History   Grav Para Term Preterm Abortions TAB SAB Ect Mult Living                 Review of Systems  Constitutional: Positive  for activity change.  Respiratory: Positive for cough and shortness of breath. Negative for wheezing.   Cardiovascular: Negative for chest pain.  Gastrointestinal: Negative for nausea, vomiting and abdominal pain.  Genitourinary: Negative for dysuria.  Musculoskeletal: Negative for neck pain.  Skin: Positive for rash.  Neurological: Negative for headaches.  All other systems reviewed and are negative.      Allergies  Ibuprofen; Adhesive; Azithromycin; Doxycycline; Sulfa antibiotics; and Sulfamethoxazole-trimethoprim  Home Medications   Current Outpatient Rx  Name  Route  Sig  Dispense  Refill  . acetaminophen (TYLENOL) 325 MG tablet   Oral   Take 2 tablets (650 mg total) by mouth every 6 (six) hours as needed for pain.   30 tablet   1   . albuterol (PROVENTIL HFA) 108 (90 BASE) MCG/ACT inhaler   Inhalation   Inhale 2 puffs into the lungs every 4 (four) hours as needed for wheezing or shortness of breath. For asthma relief         . albuterol (PROVENTIL) (2.5 MG/3ML) 0.083% nebulizer solution   Nebulization   Take 3 mLs (2.5 mg total) by nebulization every 6 (six) hours as needed  for wheezing.   75 mL   0   . allopurinol (ZYLOPRIM) 100 MG tablet   Oral   Take 100 mg by mouth every morning.          [name] amitriptyline (ELAVIL) 75 MG tablet   Oral   Take 1 tablet (75 mg total) by mouth at bedtime.   30 tablet   0   . aspirin 325 MG tablet   Oral   Take 325 mg by mouth every evening.          [name] atorvastatin (LIPITOR) 20 MG tablet   Oral   Take 20 mg by mouth every morning.          . clonazePAM (KLONOPIN) 1 MG tablet   Oral   Take 1 mg by mouth 2 (two) times daily.         . cloNIDine (CATAPRES) 0.2 MG tablet   Oral   Take 0.2 mg by mouth 2 (two) times daily.         . Cyanocobalamin (VITAMIN B 12 PO)   Oral   Take 1 tablet by mouth daily.         . diphenhydrAMINE (BENADRYL) 25 mg capsule   Oral   Take 25 mg by mouth every 6 (six) hours as  needed for itching or allergies. For itching         . lisinopril (PRINIVIL,ZESTRIL) 40 MG tablet   Oral   Take 40 mg by mouth daily.         [name] lubiprostone (AMITIZA) 24 MCG capsule   Oral   Take 24 mcg by mouth daily as needed for constipation.          . naproxen (NAPROSYN) 500 MG tablet   Oral   Take 500 mg by mouth 2 (two) times daily with a meal.         . omega-3 acid ethyl esters (LOVAZA) 1 G capsule   Oral   Take 2 g by mouth 2 (two) times daily.         . pantoprazole (PROTONIX) 40 MG tablet   Oral   Take 40 mg by mouth every morning.          . solifenacin (VESICARE) 5 MG tablet   Oral   Take 5 mg by mouth every morning.          . vitamin E 100 UNIT capsule   Oral   Take 100 Units by mouth every morning.         [name] ammonium lactate (AMLACTIN) 12 % cream   Topical   Apply 1 g topically daily as needed. Applies to back and legs.Marland KitchenMarland KitchenIn a thin layer on affected area for dry patches.         . diphenhydrAMINE (BENADRYL) 25 mg capsule   Oral   Take 1 capsule (25 mg total) by mouth every 6 (six) hours as needed for itching.   30 capsule   0   . Emollient (EUCERIN) lotion   Topical   Apply topically 2 (two) times daily as needed. On affected areas for dry patches.         [name] HYDROcodone-acetaminophen (NORCO) 10-325 MG per tablet   Oral   Take 1 tablet by mouth every 6 (six) hours as needed for pain.         [name] HYDROcodone-acetaminophen (NORCO/VICODIN) 5-325 MG per tablet   Oral   Take 1 tablet by mouth every 6 (six) hours as needed.  10 tablet   0   . ipratropium (ATROVENT) 0.02 % nebulizer solution   Nebulization   Take 2.5 mLs (0.5 mg total) by nebulization every 4 (four) hours.   75 mL   1   . ketoconazole (NIZORAL) 2 % shampoo   Topical   Apply 1 application topically 2 (two) times a week.         . mometasone (ELOCON) 0.1 % cream   Topical   Apply 1 application topically daily.   45 g   0   . permethrin (ELIMITE) 5 %  cream      Apply to affected area once   60 g   0   . promethazine (PHENERGAN) 25 MG tablet   Oral   Take 25 mg by mouth every 6 (six) hours as needed for nausea. For nausea/vomiting          BP 146/87  Pulse 97  Temp(Src) 98 F (36.7 C) (Oral)  Resp 20  Ht 5\' 5"  (1.651 m)  SpO2 93%  LMP 03/24/2009 Physical Exam  Nursing note and vitals reviewed. Constitutional: She is oriented to person, place, and time. She appears well-developed and well-nourished.  HENT:  Head: Normocephalic and atraumatic.  Eyes: EOM are normal. Pupils are equal, round, and reactive to light.  Neck: Neck supple.  Cardiovascular: Normal rate, regular rhythm and normal heart sounds.   No murmur heard. Pulmonary/Chest: Effort normal. No respiratory distress.  Abdominal: Soft. She exhibits no distension. There is no tenderness. There is no rebound and no guarding.  Musculoskeletal: She exhibits edema.  Neurological: She is alert and oriented to person, place, and time.  Skin: Skin is warm and dry. Rash noted.  Diffuse papules and vesicles overlying the lower and upper extremity. There is no pustules, and some of them, are bleeding from patient's scratching -as there is clear scratch marks around them.    ED Course  Procedures (including critical care time) Labs Review Labs Reviewed  BASIC METABOLIC PANEL - Abnormal; Notable for the following:    CO2 36 (*)    GFR calc non Af Amer 61 (*)    GFR calc Af Amer 70 (*)    All other components within normal limits  CBC  PRO B NATRIURETIC PEPTIDE  I-STAT TROPOININ, ED   Imaging Review Dg Chest Port 1 View  06/02/2013   CLINICAL DATA:  Cough, congestion, shortness of breath, chest pain.  EXAM: PORTABLE CHEST - 1 VIEW  COMPARISON:  04/14/2013  FINDINGS: Degraded by portable technique and patient body habitus. Prominent cardiomediastinal contours. Central vascular congestion. Interstitial prominence may be accentuated by overlying soft tissues. No large  consolidation. Small effusions not excluded. No pneumothorax. No acute osseous finding.  IMPRESSION: Prominent cardiomediastinal contours, similar to prior. Central vascular congestion. Slight interstitial prominence may reflect interstitial edema.   Electronically Signed   By: Carlos Levering M.D.   On: 06/02/2013 23:08     EKG Interpretation None       Date: 06/03/2013  Rate: Regular  Rhythm: normal sinus rhythm  QRS Axis: normal  Intervals: normal  ST/T Wave abnormalities: normal  Conduction Disutrbances: none  Narrative Interpretation: unremarkable     MDM   Final diagnoses:  Dyspnea  Skin rash    Pt comes in with cc of dib. Lung exam is clear, no wheezing. Xray shows some interstitial prominence - but there is no rales and BNP is normal. Bilateral lower extremity  -trace edema - so lasix 40 mg  ivp given. Last echo was normal systolic function.  Rash - ? Scabies, allergic reaction ?  Will give scabies meds and benadryl.  Stable for discharge.  PE was considered  And i checked her pulsox and RR from the last 3-4 ED visits, and her pulsox and RR are usually - 16-22 RR and 91-95% O2.  Varney Biles, MD 06/03/13 [phone]

## 2013-06-03 NOTE — Discharge Instructions (Signed)
We saw you in the ER for shortness of breath and rash. All the results in the ER are normal. We are not sure what is causing your symptoms. The workup in the ER is not complete, and is limited to screening for life threatening and emergent conditions only, so please see a primary care doctor for further evaluation.  Scabies Scabies are small bugs (mites) that burrow under the skin and cause red bumps and severe itching. These bugs can only be seen with a microscope. Scabies are highly contagious. They can spread easily from person to person by direct contact. They are also spread through sharing clothing or linens that have the scabies mites living in them. It is not unusual for an entire family to become infected through shared towels, clothing, or bedding.  HOME CARE INSTRUCTIONS   Your caregiver may prescribe a cream or lotion to kill the mites. If cream is prescribed, massage the cream into the entire body from the neck to the bottom of both feet. Also massage the cream into the scalp and face if your child is less than 49 year old. Avoid the eyes and mouth. Do not wash your hands after application.  Leave the cream on for 8 to 12 hours. Your child should bathe or shower after the 8 to 12 hour application period. Sometimes it is helpful to apply the cream to your child right before bedtime.  One treatment is usually effective and will eliminate approximately 95% of infestations. For severe cases, your caregiver may decide to repeat the treatment in 1 week. Everyone in your household should be treated with one application of the cream.  New rashes or burrows should not appear within 24 to 48 hours after successful treatment. However, the itching and rash may last for 2 to 4 weeks after successful treatment. Your caregiver may prescribe a medicine to help with the itching or to help the rash go away more quickly.  Scabies can live on clothing or linens for up to 3 days. All of your child's recently  used clothing, towels, stuffed toys, and bed linens should be washed in hot water and then dried in a dryer for at least 20 minutes on high heat. Items that cannot be washed should be enclosed in a plastic bag for at least 3 days.  To help relieve itching, bathe your child in a cool bath or apply cool washcloths to the affected areas.  Your child may return to school after treatment with the prescribed cream. SEEK MEDICAL CARE IF:   The itching persists longer than 4 weeks after treatment.  The rash spreads or becomes infected. Signs of infection include red blisters or yellow-tan crust. Document Released: 03/10/2005 Document Revised: 06/02/2011 Document Reviewed: 07/19/2008 [facility] Patient Information 2014 [place].

## 2013-06-07 IMAGING — CT CT HEAD W/O CM
4 of 6 series · 16 of 30 positions shown, 18 images · non-contrast
Comparison: 05/10/2008

CLINICAL DATA: Headache, dizziness

CT HEAD WITHOUT CONTRAST
TECHNIQUE: Contiguous axial images were obtained from the base of
the skull through the vertex without contrast.

[Series 2: brain · axial · 0.49mm/px · z∈[+137,+244]mm · 5 of 32 slices shown, 7 images (1 of 2)]
[im 6/32  brain]
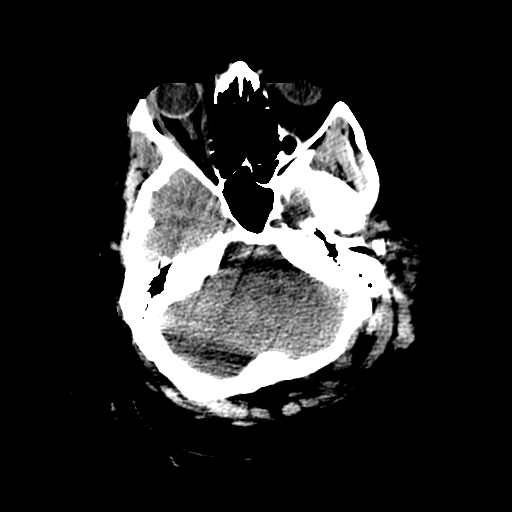
[im 6/32  bone]
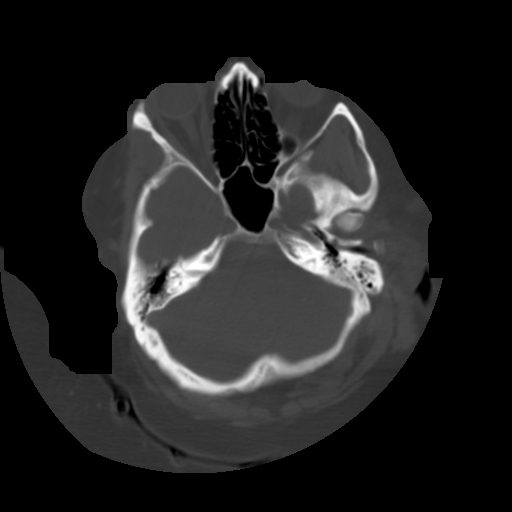
[im 11/32  brain]
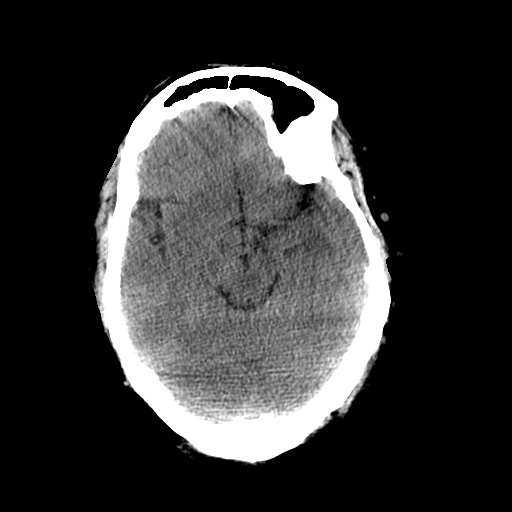
[im 16/32  brain]
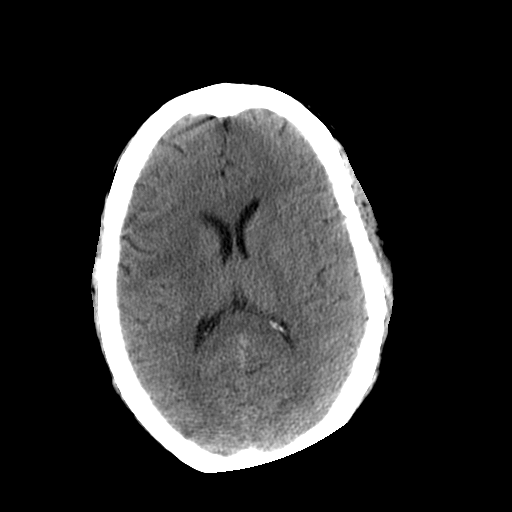
[im 21/32  brain]
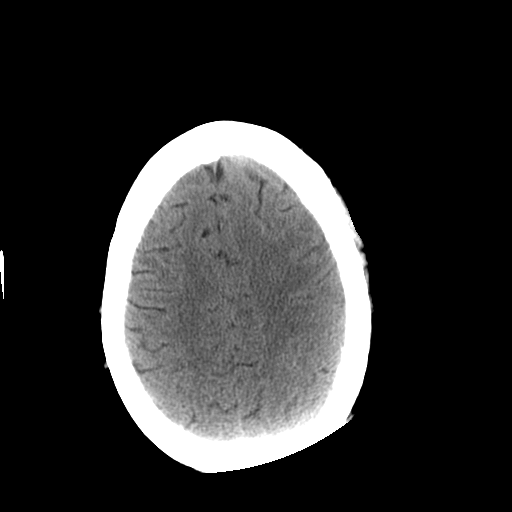
[im 26/32  brain]
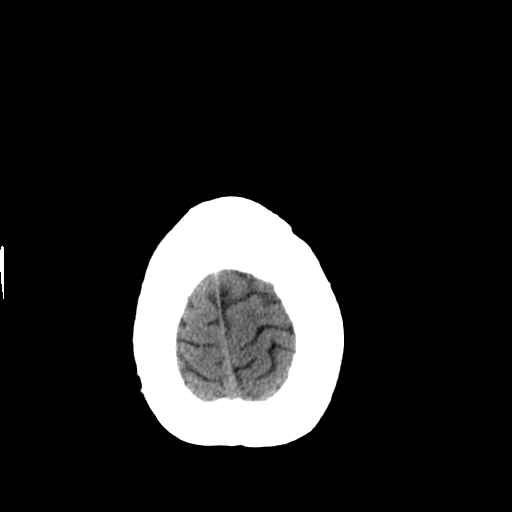
[im 26/32  bone]
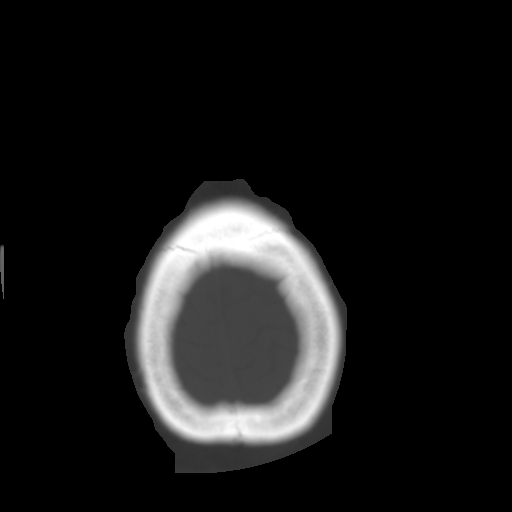

[Series 3: recon 2: brain · axial · 0.49mm/px · z∈[+137,+244]mm · 5 of 32 slices shown (1 of 2)]
[im 6/32  brain]
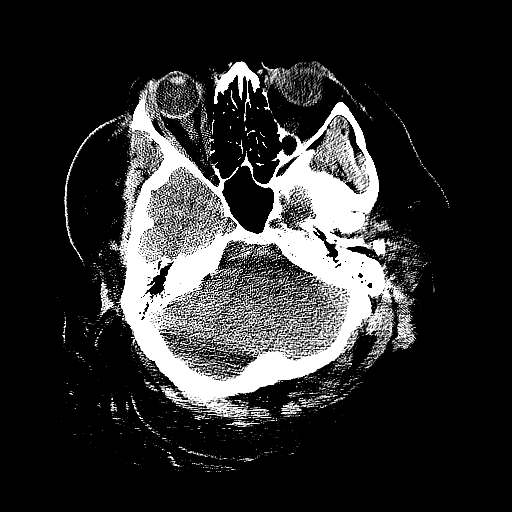
[im 11/32  brain]
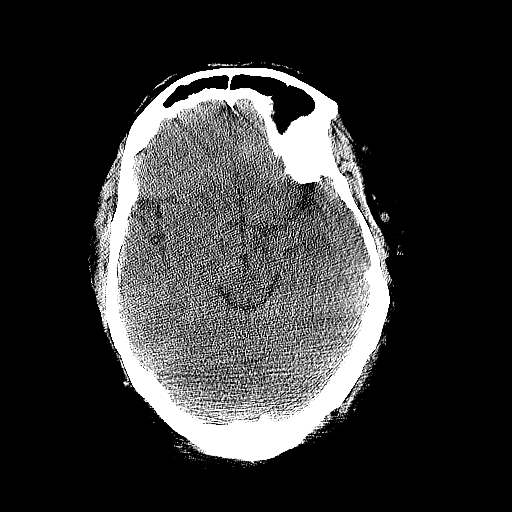
[im 16/32  brain]
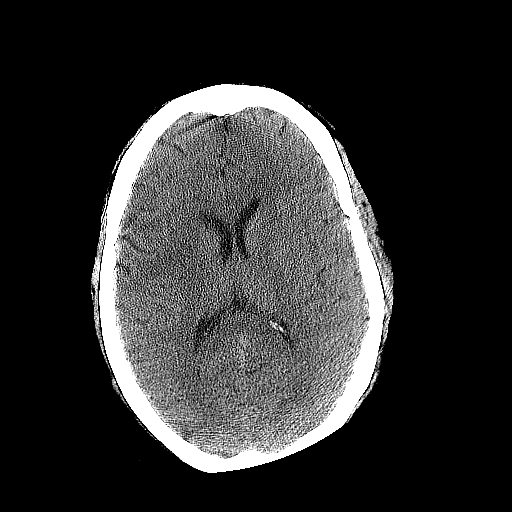
[im 21/32  brain]
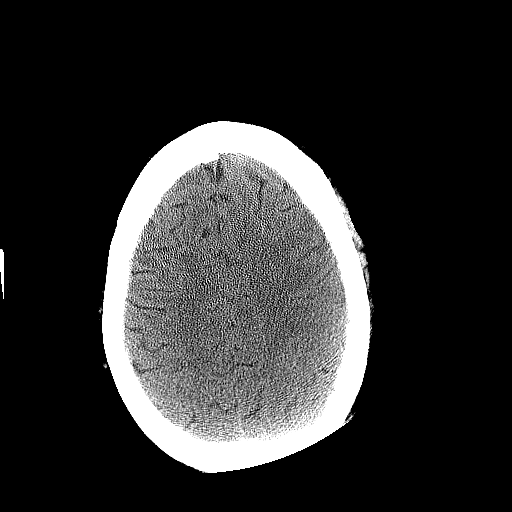
[im 26/32  brain]
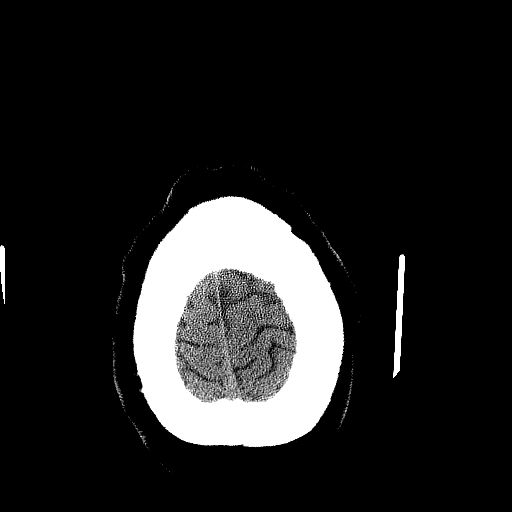

[Series 4: brain · axial · 0.49mm/px · z∈[+146,+211]mm · 3 of 24 slices shown (2 of 2)]
[im 6/24  brain]
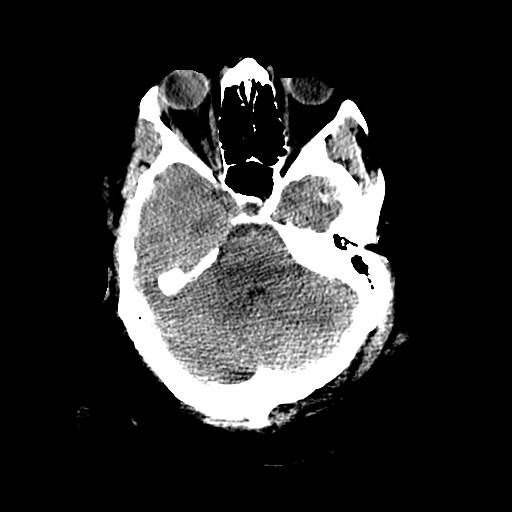
[im 12/24  brain]
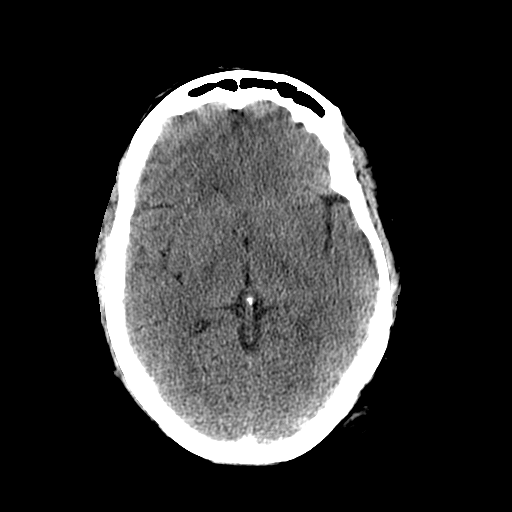
[im 18/24  brain]
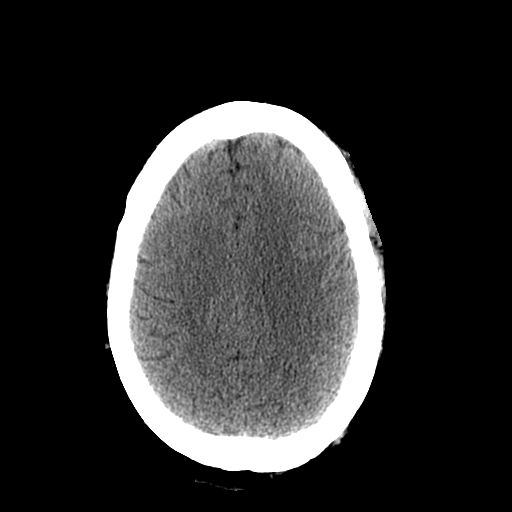

[Series 5: recon 2: brain · axial · 0.49mm/px · z∈[+146,+211]mm · 3 of 24 slices shown (2 of 2)]
[im 6/24  brain]
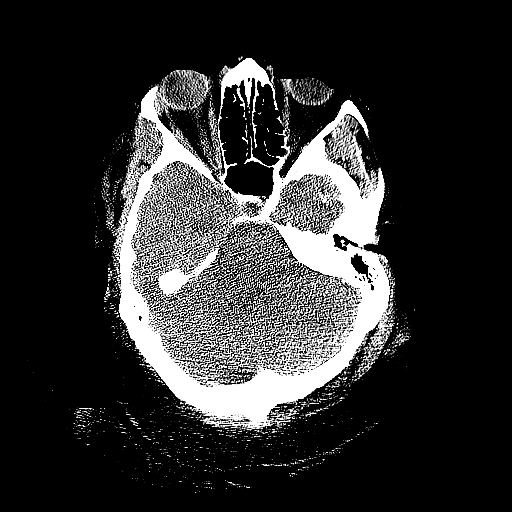
[im 12/24  brain]
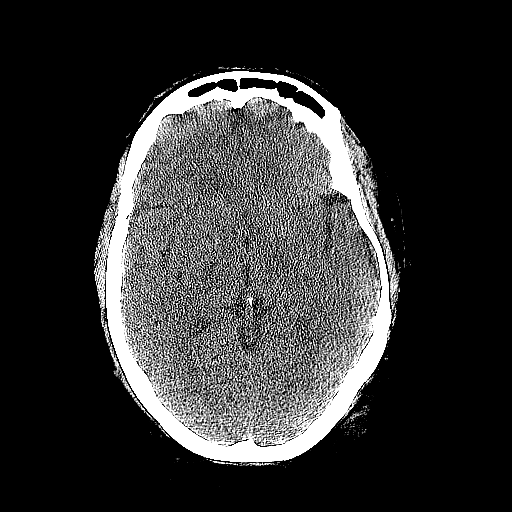
[im 18/24  brain]
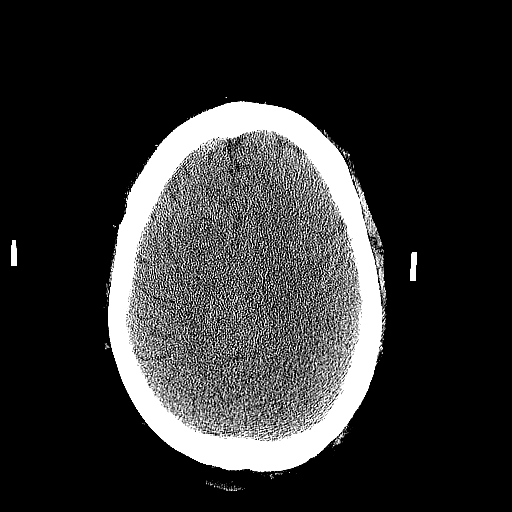

[16 of 30 positions shown; findings below may reference images not displayed]

FINDINGS: Fine detail is obscured by patient body habitus.
Examination is degraded by patient motion.  Images were repeated.
No acute hemorrhage, acute infarction, or mass lesion is
identified.  No midline shift.  No ventriculomegaly.  No skull
fracture.
IMPRESSION: No acute intracranial finding.

## 2013-06-11 ENCOUNTER — Emergency Department (HOSPITAL_COMMUNITY)
Admission: EM | Admit: 2013-06-11 | Discharge: 2013-06-12 | Disposition: A | Discharge status: Home / Self Care | Payer: Medicaid Other | Attending: Emergency Medicine | Admitting: Emergency Medicine

## 2013-06-11 ENCOUNTER — Emergency Department (HOSPITAL_COMMUNITY): Payer: Medicaid Other

## 2013-06-11 ENCOUNTER — Encounter (HOSPITAL_COMMUNITY): Payer: Self-pay | Admitting: Emergency Medicine

## 2013-06-11 DIAGNOSIS — Z791 Long term (current) use of non-steroidal anti-inflammatories (NSAID): Secondary | ICD-10-CM | POA: Insufficient documentation

## 2013-06-11 DIAGNOSIS — R0989 Other specified symptoms and signs involving the circulatory and respiratory systems: Secondary | ICD-10-CM | POA: Insufficient documentation

## 2013-06-11 DIAGNOSIS — R0789 Other chest pain: Secondary | ICD-10-CM | POA: Insufficient documentation

## 2013-06-11 DIAGNOSIS — I10 Essential (primary) hypertension: Secondary | ICD-10-CM | POA: Insufficient documentation

## 2013-06-11 DIAGNOSIS — R609 Edema, unspecified: Secondary | ICD-10-CM | POA: Insufficient documentation

## 2013-06-11 DIAGNOSIS — R0600 Dyspnea, unspecified: Secondary | ICD-10-CM

## 2013-06-11 DIAGNOSIS — E785 Hyperlipidemia, unspecified: Secondary | ICD-10-CM | POA: Insufficient documentation

## 2013-06-11 DIAGNOSIS — R0609 Other forms of dyspnea: Secondary | ICD-10-CM | POA: Insufficient documentation

## 2013-06-11 DIAGNOSIS — M109 Gout, unspecified: Secondary | ICD-10-CM | POA: Insufficient documentation

## 2013-06-11 DIAGNOSIS — Z87891 Personal history of nicotine dependence: Secondary | ICD-10-CM | POA: Insufficient documentation

## 2013-06-11 DIAGNOSIS — Z7982 Long term (current) use of aspirin: Secondary | ICD-10-CM | POA: Insufficient documentation

## 2013-06-11 DIAGNOSIS — R059 Cough, unspecified: Secondary | ICD-10-CM | POA: Insufficient documentation

## 2013-06-11 DIAGNOSIS — Z79899 Other long term (current) drug therapy: Secondary | ICD-10-CM | POA: Insufficient documentation

## 2013-06-11 DIAGNOSIS — M129 Arthropathy, unspecified: Secondary | ICD-10-CM | POA: Insufficient documentation

## 2013-06-11 DIAGNOSIS — R05 Cough: Secondary | ICD-10-CM | POA: Insufficient documentation

## 2013-06-11 DIAGNOSIS — Q828 Other specified congenital malformations of skin: Secondary | ICD-10-CM | POA: Insufficient documentation

## 2013-06-11 DIAGNOSIS — J45909 Unspecified asthma, uncomplicated: Secondary | ICD-10-CM | POA: Insufficient documentation

## 2013-06-11 DIAGNOSIS — K219 Gastro-esophageal reflux disease without esophagitis: Secondary | ICD-10-CM | POA: Insufficient documentation

## 2013-06-11 DIAGNOSIS — Z9981 Dependence on supplemental oxygen: Secondary | ICD-10-CM | POA: Insufficient documentation

## 2013-06-11 MED ORDER — ALBUTEROL SULFATE (2.5 MG/3ML) 0.083% IN NEBU
5.0000 mg | INHALATION_SOLUTION | Freq: Once | RESPIRATORY_TRACT | Status: AC
  Administered 2013-06-12: 5 mg via RESPIRATORY_TRACT
  Filled 2013-06-11: qty 6

## 2013-06-11 MED ORDER — METHYLPREDNISOLONE SODIUM SUCC 125 MG IJ SOLR
125.0000 mg | Freq: Once | INTRAMUSCULAR | Status: AC
  Administered 2013-06-12: 125 mg via INTRAVENOUS
  Filled 2013-06-11: qty 2

## 2013-06-11 NOTE — ED Notes (Signed)
Patient arrives via EMS for c/o CP x 1 week Patient also c/o SOB Patient given 5 mg of Albuterol by EMS Patient took own neb tx around 1900 at home Patient states that she has "chest cold" for about one week

## 2013-06-12 LAB — COMPREHENSIVE METABOLIC PANEL
ALBUMIN: 3.3 g/dL — AB (ref 3.5–5.2)
ALT: 11 U/L (ref 0–35)
AST: 12 U/L (ref 0–37)
Alkaline Phosphatase: 62 U/L (ref 39–117)
BUN: 12 mg/dL (ref 6–23)
CALCIUM: 8.7 mg/dL (ref 8.4–10.5)
CO2: 29 mEq/L (ref 19–32)
CREATININE: 1 mg/dL (ref 0.50–1.10)
Chloride: 100 mEq/L (ref 96–112)
GFR calc Af Amer: 75 mL/min — ABNORMAL LOW (ref >90)
GFR calc non Af Amer: 65 mL/min — ABNORMAL LOW (ref >90)
Glucose, Bld: 115 mg/dL — ABNORMAL HIGH (ref 70–99)
Potassium: 4.3 mEq/L (ref 3.7–5.3)
Sodium: 142 mEq/L (ref 137–147)
Total Bilirubin: 0.3 mg/dL (ref 0.3–1.2)
Total Protein: 6.8 g/dL (ref 6.0–8.3)

## 2013-06-12 LAB — I-STAT CHEM 8, ED
BUN: 11 mg/dL (ref 6–23)
CALCIUM ION: 1.05 mmol/L — AB (ref 1.12–1.23)
Chloride: 100 mEq/L (ref 96–112)
Creatinine, Ser: 1.1 mg/dL (ref 0.50–1.10)
GLUCOSE: 112 mg/dL — AB (ref 70–99)
HCT: 40 % (ref 36.0–46.0)
HEMOGLOBIN: 13.6 g/dL (ref 12.0–15.0)
POTASSIUM: 4.1 meq/L (ref 3.7–5.3)
Sodium: 143 mEq/L (ref 137–147)
TCO2: 30 mmol/L (ref 0–100)

## 2013-06-12 LAB — CBC
HEMATOCRIT: 40.4 % (ref 36.0–46.0)
Hemoglobin: 12.5 g/dL (ref 12.0–15.0)
MCH: 29.1 pg (ref 26.0–34.0)
MCHC: 30.9 g/dL (ref 30.0–36.0)
MCV: 94.2 fL (ref 78.0–100.0)
Platelets: 268 10*3/uL (ref 150–400)
RBC: 4.29 MIL/uL (ref 3.87–5.11)
RDW: 14.8 % (ref 11.5–15.5)
WBC: 8.6 10*3/uL (ref 4.0–10.5)

## 2013-06-12 LAB — I-STAT TROPONIN, ED: TROPONIN I, POC: 0 ng/mL (ref 0.00–0.08)

## 2013-06-12 LAB — PRO B NATRIURETIC PEPTIDE: PRO B NATRI PEPTIDE: 157.9 pg/mL — AB (ref 0–125)

## 2013-06-12 MED ORDER — FUROSEMIDE 10 MG/ML IJ SOLN
40.0000 mg | Freq: Once | INTRAMUSCULAR | Status: AC
  Administered 2013-06-12: 40 mg via INTRAVENOUS
  Filled 2013-06-12: qty 4

## 2013-06-12 MED ORDER — TRAMADOL HCL 50 MG PO TABS: 50.0000 mg | ORAL_TABLET | Freq: Four times a day (QID) | ORAL | Status: DC | PRN

## 2013-06-12 MED ORDER — HYDROXYZINE HCL 25 MG PO TABS: 25.0000 mg | ORAL_TABLET | Freq: Four times a day (QID) | ORAL | Status: DC

## 2013-06-12 NOTE — ED Provider Notes (Signed)
CSN: [id number]     Arrival date & time 06/11/13  2205 History   First MD Initiated Contact with Patient 06/11/13 2307     Chief Complaint  Patient presents with  . Shortness of Breath     (Consider location/radiation/quality/duration/timing/severity/associated sxs/prior Treatment) HPI History provided by patient. History of morbid obesity, uses a wheelchair, hypertension, asthma, CHF.  Is on home oxygen, takes Lasix twice daily and presents tonight with increased shortness of breath, worried that she may be in CHF.  She has had coughing congestion for last week. No fevers or chills. She has some chest tightness over the last week as well. Brought in by EMS, received albuterol treatment in route. Patient states her symptoms are still present and unchanged. No weight gain. No new LE swelling  Past Medical History  Diagnosis Date  . Asthma   . GERD (gastroesophageal reflux disease)   . Hypertension   . Homelessness   . Low back pain   . 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 Arthritis   . Gout   . Hyperlipemia   . Morbid obesity     uses motor wheel chair  . MRSA (methicillin resistant Staphylococcus aureus)     states about a year ago   Past Surgical History  Procedure Laterality Date  . Tubal ligation    . Cesarean section     Family History  Problem Relation Age of Onset  . Asthma Mother   . Diabetes Father   . Heart disease Father   . Stroke Father    History  Substance Use Topics  . Smoking status: Former Smoker    Types: Cigarettes    Quit date: 03/25/2003  . Smokeless tobacco: Former Systems developer    Quit date: 05/27/1978  . Alcohol Use: No   OB History   Grav Para Term Preterm Abortions TAB SAB Ect Mult Living                 Review of Systems  Constitutional: Negative for fever and chills.  Respiratory: Positive for shortness of breath.   Cardiovascular: Positive for chest pain.  Gastrointestinal: Negative for vomiting and abdominal pain.   Genitourinary: Negative for difficulty urinating.  Musculoskeletal: Negative for back pain.  Skin: Negative for rash.  Neurological: Negative for headaches.  All other systems reviewed and are negative.      Allergies  Ibuprofen; Adhesive; Azithromycin; Doxycycline; Sulfa antibiotics; and Sulfamethoxazole-trimethoprim  Home Medications   Current Outpatient Rx  Name  Route  Sig  Dispense  Refill  . acetaminophen (TYLENOL) 325 MG tablet   Oral   Take 650 mg by mouth every 6 (six) hours as needed for mild pain or headache.         . albuterol (PROVENTIL HFA) 108 (90 BASE) MCG/ACT inhaler   Inhalation   Inhale 2 puffs into the lungs every 4 (four) hours as needed for wheezing or shortness of breath. For asthma relief         . albuterol (PROVENTIL) (2.5 MG/3ML) 0.083% nebulizer solution   Nebulization   Take 3 mLs (2.5 mg total) by nebulization every 6 (six) hours as needed for wheezing.   75 mL   0   . allopurinol (ZYLOPRIM) 100 MG tablet   Oral   Take 100 mg by mouth every morning.          [name] amitriptyline (ELAVIL) 75 MG tablet   Oral   Take 1 tablet (75  mg total) by mouth at bedtime.   30 tablet   0   . ammonium lactate (AMLACTIN) 12 % cream   Topical   Apply 1 g topically daily as needed for dry skin. Applies to back and legs.Marland KitchenMarland KitchenIn a thin layer on affected area for dry patches.         [name] aspirin 325 MG tablet   Oral   Take 325 mg by mouth every evening.          [name] atorvastatin (LIPITOR) 20 MG tablet   Oral   Take 20 mg by mouth every morning.          . clonazePAM (KLONOPIN) 1 MG tablet   Oral   Take 1 mg by mouth 2 (two) times daily.         . cloNIDine (CATAPRES) 0.2 MG tablet   Oral   Take 0.2 mg by mouth 2 (two) times daily.         . diphenhydrAMINE (BENADRYL) 25 mg capsule   Oral   Take 25 mg by mouth every 6 (six) hours as needed for itching or allergies. For itching         . Emollient (EUCERIN) lotion   Topical   Apply 5  mLs topically 2 (two) times daily as needed for dry skin. On affected areas for dry patches.         [name] HYDROcodone-acetaminophen (NORCO) 10-325 MG per tablet   Oral   Take 1 tablet by mouth every 6 (six) hours as needed for pain.         [name] ketoconazole (NIZORAL) 2 % shampoo   Topical   Apply 1 application topically 2 (two) times a week.         [name] ketoconazole (NIZORAL) 200 MG tablet   Oral   Take 200 mg by mouth daily.         [name] lisinopril (PRINIVIL,ZESTRIL) 40 MG tablet   Oral   Take 40 mg by mouth daily.         [name] lubiprostone (AMITIZA) 24 MCG capsule   Oral   Take 24 mcg by mouth daily as needed for constipation.          . naproxen (NAPROSYN) 500 MG tablet   Oral   Take 500 mg by mouth 2 (two) times daily with a meal.         . omega-3 acid ethyl esters (LOVAZA) 1 G capsule   Oral   Take 1 g by mouth 2 (two) times daily.          . pantoprazole (PROTONIX) 40 MG tablet   Oral   Take 40 mg by mouth every morning.          . promethazine (PHENERGAN) 25 MG tablet   Oral   Take 25 mg by mouth every 6 (six) hours as needed for nausea. For nausea/vomiting         . solifenacin (VESICARE) 5 MG tablet   Oral   Take 5 mg by mouth every morning.          . vitamin B-12 (CYANOCOBALAMIN) 1000 MCG tablet   Oral   Take 1,000 mcg by mouth daily.         . vitamin E 100 UNIT capsule   Oral   Take 100 Units by mouth every morning.          BP 130/70  Pulse 80  Temp(Src) 98.5 F (36.9 C) (Oral)  Resp  22  Ht 5\' 5"  (1.651 m)  SpO2 97%  LMP 03/24/2009 Physical Exam  Constitutional: She is oriented to person, place, and time. She appears well-developed and well-nourished.  HENT:  Head: Normocephalic and atraumatic.  Eyes: EOM are normal. Pupils are equal, round, and reactive to light.  Neck: Neck supple. No JVD present.  Cardiovascular: Normal rate, regular rhythm and intact distal pulses.   Pulmonary/Chest: Effort normal. No respiratory  distress.  Decreased bilateral breath sounds, exam compromised by morbid obesity. No auscultated wheezes or rales  Abdominal: Soft. She exhibits no distension. There is no tenderness.  Musculoskeletal: Normal range of motion.  1+ symmetric pretibial edema, large body habitus, no calf tenderness or erythema, equal distal pulses intact.  Neurological: She is alert and oriented to person, place, and time.  Skin: Skin is warm and dry.    ED Course  Procedures (including critical care time) Labs Review Labs Reviewed  COMPREHENSIVE METABOLIC PANEL - Abnormal; Notable for the following:    Glucose, Bld 115 (*)    Albumin 3.3 (*)    GFR calc non Af Amer 65 (*)    GFR calc Af Amer 75 (*)    All other components within normal limits  PRO B NATRIURETIC PEPTIDE - Abnormal; Notable for the following:    Pro B Natriuretic peptide (BNP) 157.9 (*)    All other components within normal limits  I-STAT CHEM 8, ED - Abnormal; Notable for the following:    Glucose, Bld 112 (*)    Calcium, Ion 1.05 (*)    All other components within normal limits  CBC  I-STAT TROPOININ, ED   Imaging Review Dg Chest 2 View  06/12/2013   CLINICAL DATA:  Cough. Shortness of breath. Smoker with current history of asthma and hypertension. Morbidly obese.  EXAM: CHEST  2 VIEW  COMPARISON:  DG CHEST 1V PORT dated 06/02/2013; DG CHEST 2 VIEW dated 04/14/2013; DG CHEST 2 VIEW dated 01/09/2013; DG CHEST 2 VIEW dated 01/06/2013; DG CHEST 2 VIEW dated 03/22/2012  FINDINGS: Suboptimal inspiration due to body habitus accounts for crowded bronchovascular markings diffusely and bibasilar atelectasis, and also accentuates the cardiac silhouette. Taking this into account, cardiac silhouette moderately enlarged but stable. Hilar and mediastinal contours otherwise unremarkable. Prominent bronchovascular markings diffusely and mild central peribronchial thickening, unchanged. Lungs otherwise clear. No localized airspace consolidation. No pleural  effusions. No pneumothorax. Normal pulmonary vascularity. . Degenerative changes involving the thoracic spine.  IMPRESSION: Stable cardiomegaly without pulmonary edema. Suboptimal inspiration accounts for bibasilar atelectasis. No acute cardiopulmonary disease otherwise. Stable mild chronic bronchitis and/or asthma.   Electronically Signed   By: Evangeline Dakin M.D.   On: 06/12/2013 01:13     EKG Interpretation   Date/Time:  Saturday June 11 2013 22:10:55 EDT Ventricular Rate:  88 PR Interval:  140 QRS Duration: 97 QT Interval:  365 QTC Calculation: 442 R Axis:   42 Text Interpretation:  Sinus rhythm Low voltage, precordial leads  Nonspecific ST abnormality No significant change since last tracing  Confirmed by Arva Slaugh  MD, Kori Colin ([id number]) on 06/12/2013 12:02:28 AM     Albuterol and steroids provided. No significant change in symptoms. Remains no respiratory distress or hypoxia. IV Lasix provided  Labs and imaging results reviewed with patient as above. She is well-appearing. No indication for admit at this time. Plan discharge home, continue medications as prescribed. Patient has been educated to increase her Lasix by her physician as needed - she agrees to do this. Strict return  precautions verbalized is understood.  MDM   Diagnosis: Dyspnea, history of asthma, history of CHF, morbid obesity  Evaluated with EKG, labs and imaging reviewed as above. Medications provided Vital signs nursing notes reviewed and considered    Teressa Lower, MD 06/12/13 [phone]

## 2013-06-12 NOTE — Discharge Instructions (Signed)
Continue your oxygen and continue medications as prescribed including your Lasix.  Follow up with your physician tomorrow for recheck. Return here for any weight gain or worsening condition.  Shortness of Breath    Shortness of breath means you have trouble breathing. Shortness of breath may indicate that you have a medical problem. You should seek immediate medical care for shortness of breath.  CAUSES  Not enough oxygen in the air (as with high altitudes or a smoke-filled room).  Short-term (acute) lung disease, including:  Infections, such as pneumonia.  Fluid in the lungs, such as heart failure.  A blood clot in the lungs (pulmonary embolism).  Long-term (chronic) lung diseases.  Heart disease (heart attack, angina, heart failure, and others).  Low red blood cells (anemia).  Poor physical fitness. This can cause shortness of breath when you exercise.  Chest or back injuries or stiffness.  Being overweight.  Smoking.  Anxiety. This can make you feel like you are not getting enough air. DIAGNOSIS  Serious medical problems can usually be found during your physical exam. Tests may also be done to determine why you are having shortness of breath. Tests may include:  Chest X-rays.  Lung function tests.  Blood tests.  Electrocardiography.  Exercise testing.  Echocardiography.  Imaging scans. Your caregiver may not be able to find a cause for your shortness of breath after your exam. In this case, it is important to have a follow-up exam with your caregiver as directed.  TREATMENT  Treatment for shortness of breath depends on the cause of your symptoms and can vary greatly.  HOME CARE INSTRUCTIONS  Do not smoke. Smoking is a common cause of shortness of breath. If you smoke, ask for help to quit.  Avoid being around chemicals or things that may bother your breathing, such as paint fumes and dust.  Rest as needed. Slowly resume your usual activities.  If medicines were prescribed, take  them as directed for the full length of time directed. This includes oxygen and any inhaled medicines.  Keep all follow-up appointments as directed by your caregiver. SEEK MEDICAL CARE IF:  Your condition does not improve in the time expected.  You have a hard time doing your normal activities even with rest.  You have any side effects or problems with the medicines prescribed.  You develop any new symptoms. SEEK IMMEDIATE MEDICAL CARE IF:  Your shortness of breath gets worse.  You feel lightheaded, faint, or develop a cough not controlled with medicines.  You start coughing up blood.  You have pain with breathing.  You have chest pain or pain in your arms, shoulders, or abdomen.  You have a fever.  You are unable to walk up stairs or exercise the way you normally do. MAKE SURE YOU:  Understand these instructions.  Will watch your condition.  Will get help right away if you are not doing well or get worse. Document Released: 12/03/2000 Document Revised: 09/09/2011 Document Reviewed: 05/26/2011  [facility] Patient Information 2014 [place].

## 2013-06-12 NOTE — ED Notes (Signed)
[facility] called for transport.  

## 2013-06-28 IMAGING — CT CT HEAD W/O CM
2 of 4 series · 16 of 30 positions shown, 18 images · non-contrast
Comparison: 11/19/2010

CLINICAL DATA: Headache, hypertension, swelling.

CT HEAD WITHOUT CONTRAST
TECHNIQUE: Contiguous axial images were obtained from the base of
the skull through the vertex without contrast.

[Series 2: brain · axial · 0.49mm/px · z∈[+99,+227]mm · 8 of 32 slices shown, 10 images]
[im 4/32  brain]
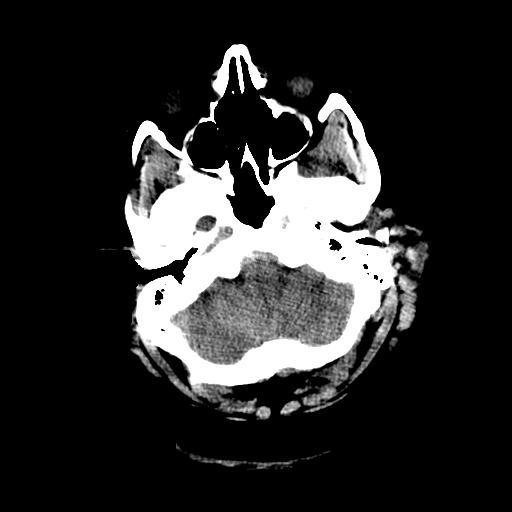
[im 4/32  bone]
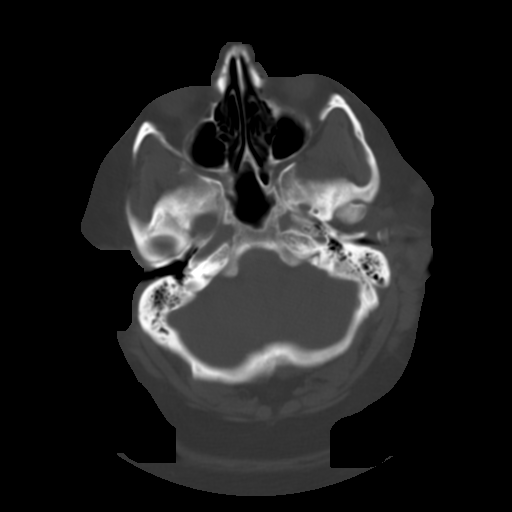
[im 7/32  brain]
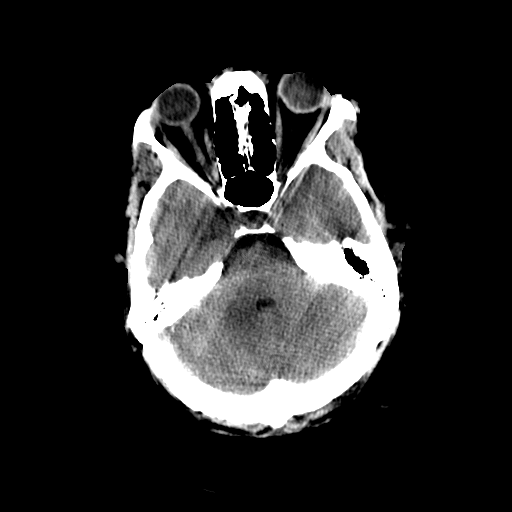
[im 11/32  brain]
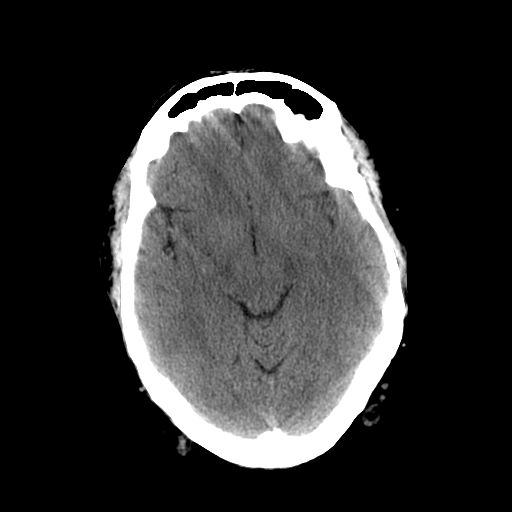
[im 14/32  brain]
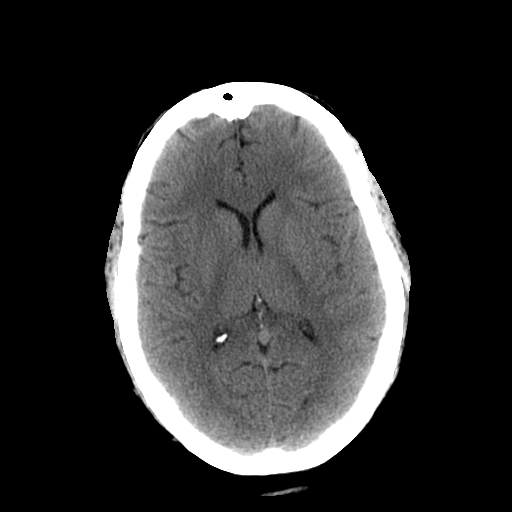
[im 18/32  brain]
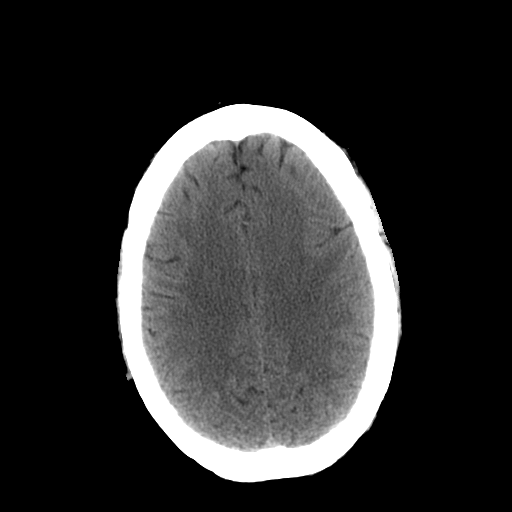
[im 18/32  bone]
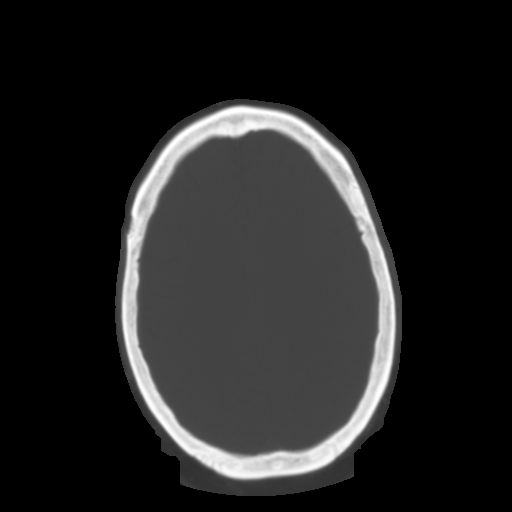
[im 21/32  brain]
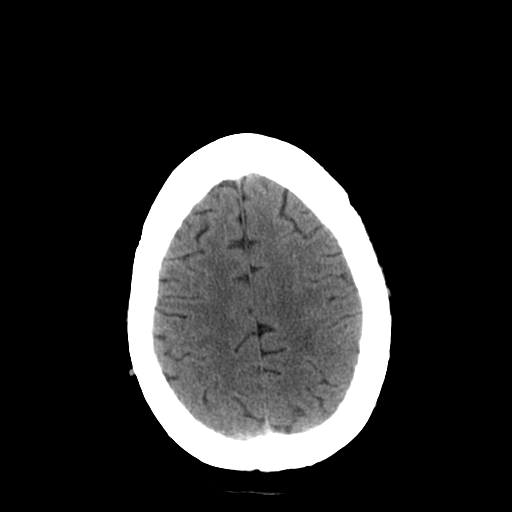
[im 25/32  brain]
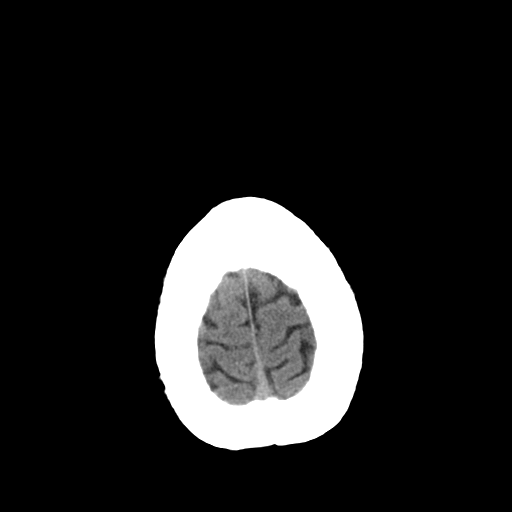
[im 28/32  brain]
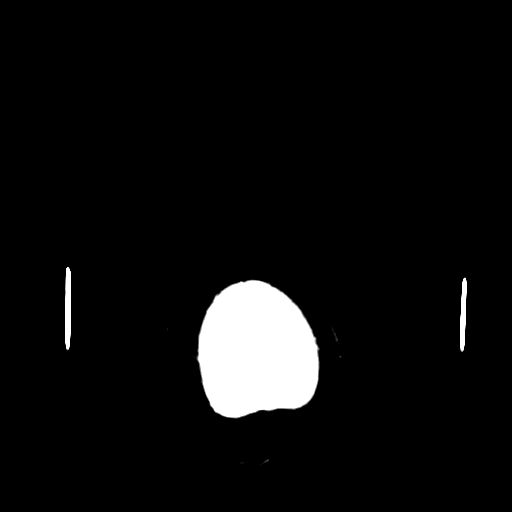

[Series 3: recon 2: brain · axial · 0.49mm/px · z∈[+99,+227]mm · 8 of 32 slices shown]
[im 4/32  brain]
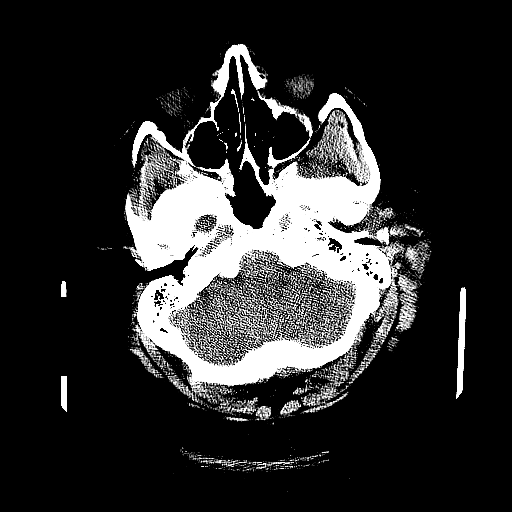
[im 7/32  brain]
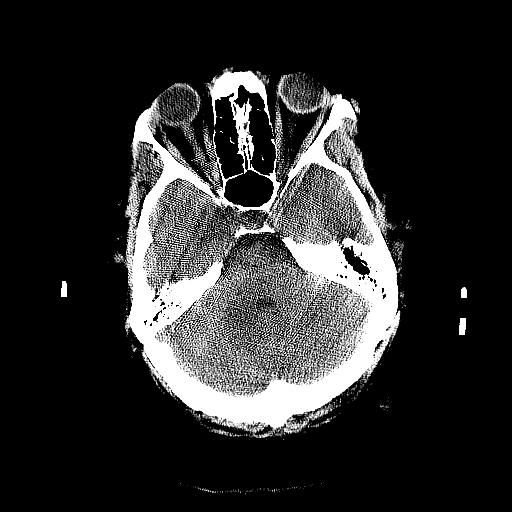
[im 11/32  brain]
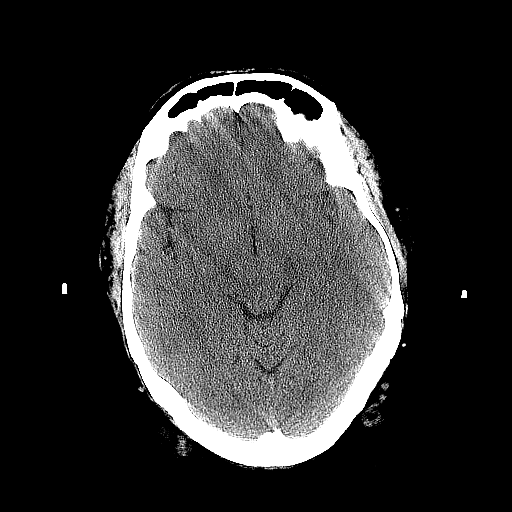
[im 14/32  brain]
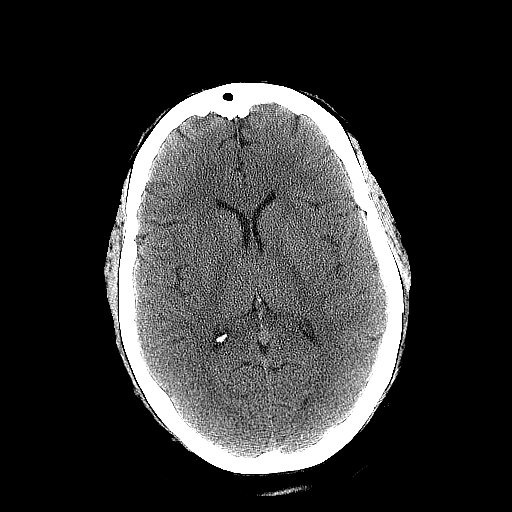
[im 18/32  brain]
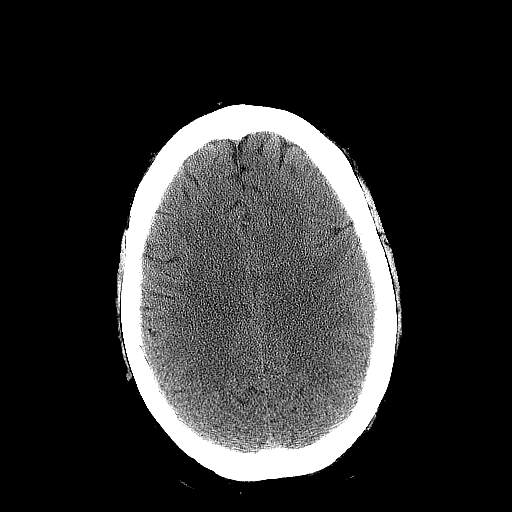
[im 21/32  brain]
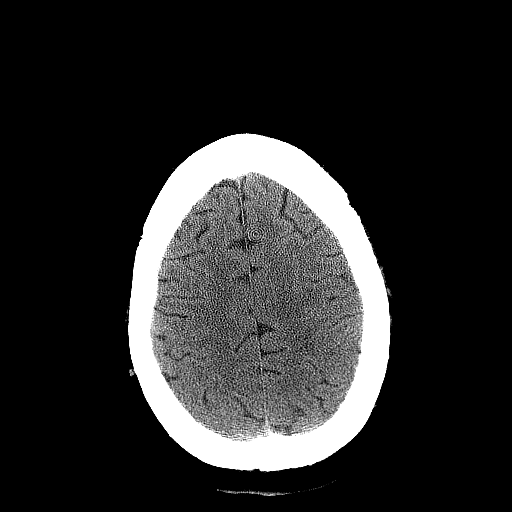
[im 25/32  brain]
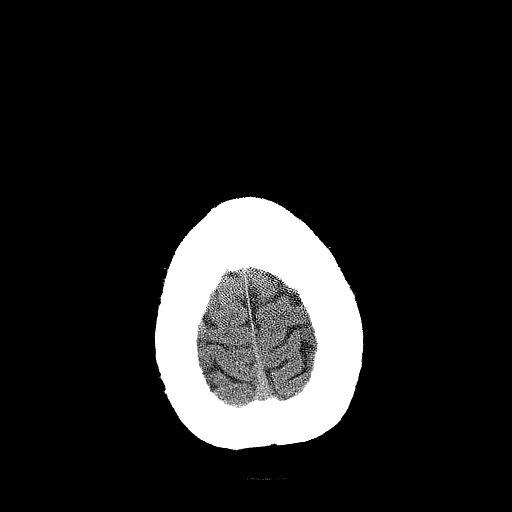
[im 28/32  brain]
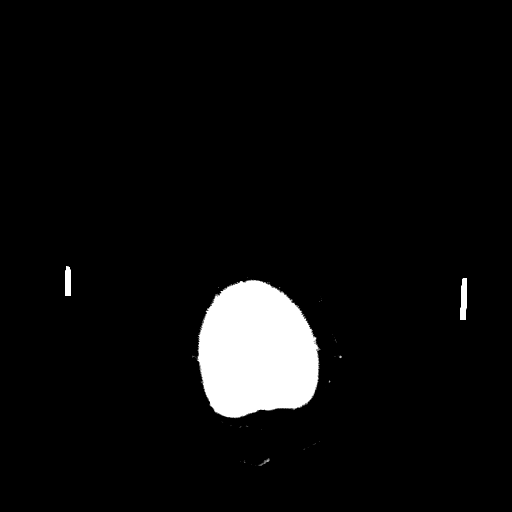

[16 of 30 positions shown; findings below may reference images not displayed]

FINDINGS: Despite multiple attempts, images through the posterior
fossa are degraded by streak artifact.  Otherwise, there is no
evidence for acute hemorrhage, hydrocephalus, mass lesion, or
abnormal extra-axial fluid collection.  No definite CT evidence for
acute infarction. The visualized paranasal sinuses and mastoid air
cells are predominately clear.
IMPRESSION: No acute intracranial abnormality.

## 2013-06-28 IMAGING — CR DG CHEST 2V
2 series · 2 of 2 positions shown · non-contrast
Comparison: 11/19/2010

CLINICAL DATA: Shortness of breath, leg swelling.

CHEST - 2 VIEW

[w chest pa]
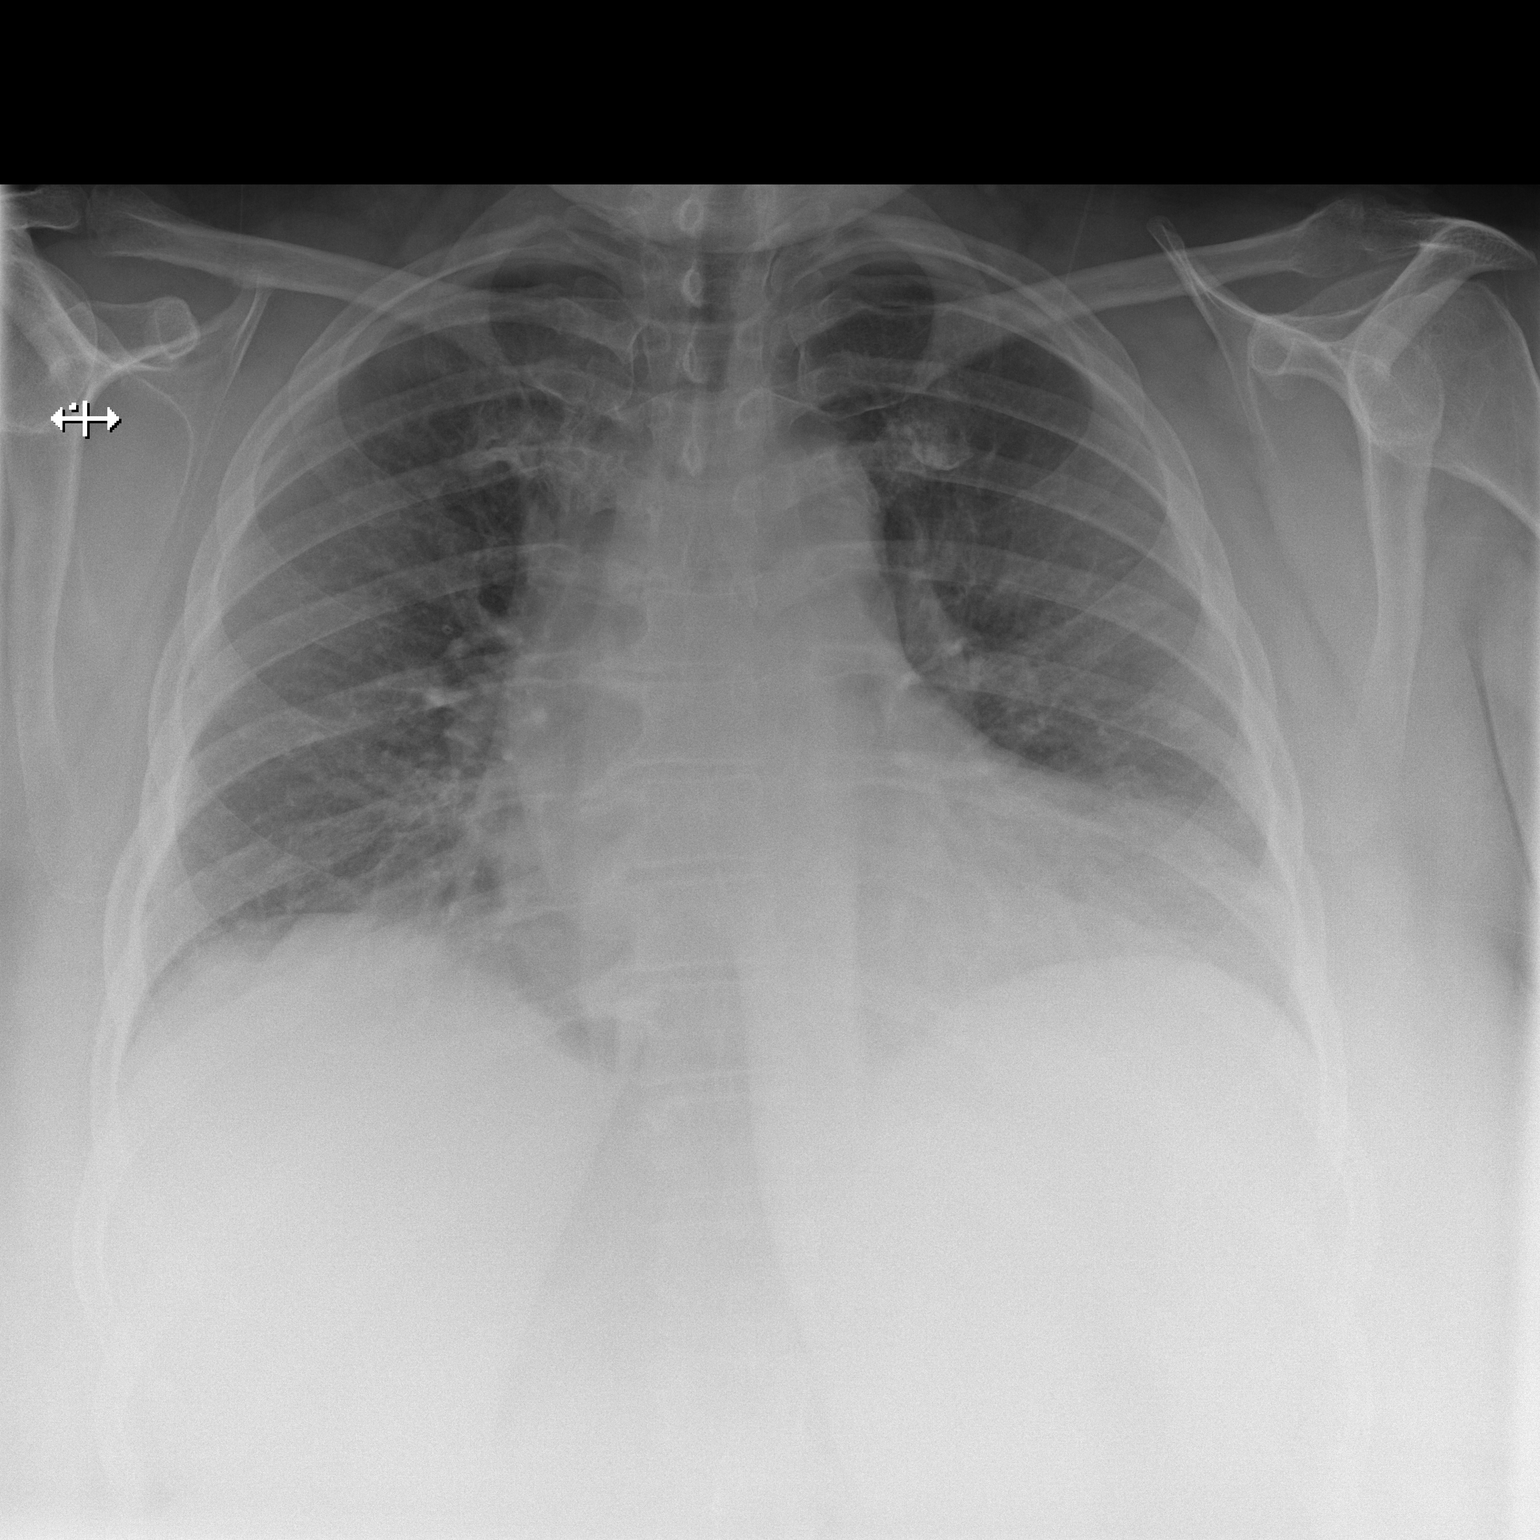

[w chest lat]
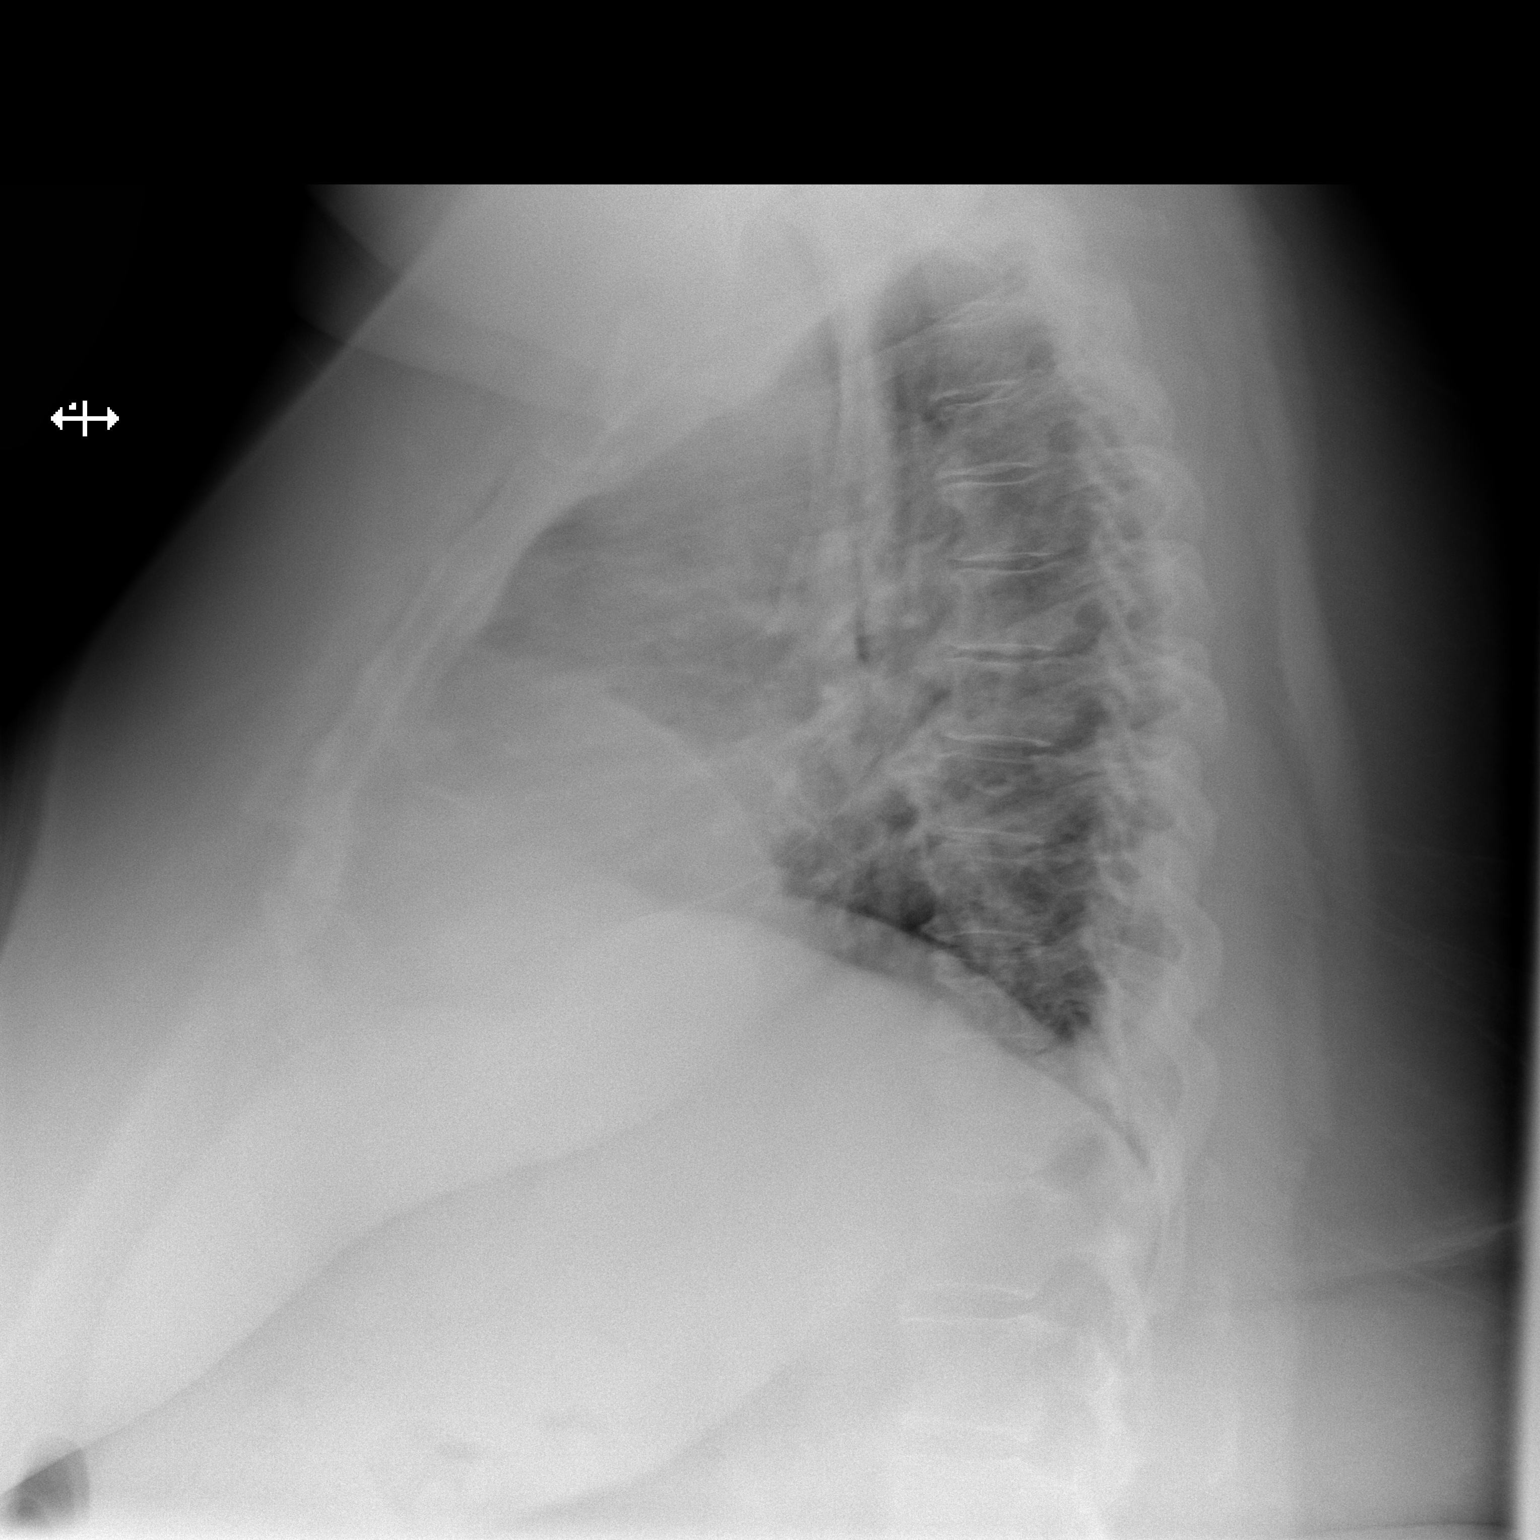

[2 of 2 positions shown; findings below may reference images not displayed]

FINDINGS: Cardiomegaly.  Central vascular congestion.  Mild
interstitial prominence.  Otherwise, no areas of focal
consolidation.  No pneumothorax or large pleural effusion.
Multilevel degenerative changes.  No acute osseous abnormality.
IMPRESSION: Cardiomegaly with central vascular congestion and mild interstitial
prominence, suggesting edema.

No focal areas of consolidation.

## 2013-06-29 ENCOUNTER — Encounter (HOSPITAL_COMMUNITY): Payer: Self-pay | Admitting: Emergency Medicine

## 2013-06-29 DIAGNOSIS — M545 Low back pain, unspecified: Secondary | ICD-10-CM | POA: Insufficient documentation

## 2013-06-29 DIAGNOSIS — Z87891 Personal history of nicotine dependence: Secondary | ICD-10-CM | POA: Insufficient documentation

## 2013-06-29 DIAGNOSIS — Z791 Long term (current) use of non-steroidal anti-inflammatories (NSAID): Secondary | ICD-10-CM | POA: Insufficient documentation

## 2013-06-29 DIAGNOSIS — Z8614 Personal history of Methicillin resistant Staphylococcus aureus infection: Secondary | ICD-10-CM | POA: Insufficient documentation

## 2013-06-29 DIAGNOSIS — M109 Gout, unspecified: Secondary | ICD-10-CM | POA: Insufficient documentation

## 2013-06-29 DIAGNOSIS — E785 Hyperlipidemia, unspecified: Secondary | ICD-10-CM | POA: Insufficient documentation

## 2013-06-29 DIAGNOSIS — Z79899 Other long term (current) drug therapy: Secondary | ICD-10-CM | POA: Insufficient documentation

## 2013-06-29 DIAGNOSIS — K219 Gastro-esophageal reflux disease without esophagitis: Secondary | ICD-10-CM | POA: Insufficient documentation

## 2013-06-29 DIAGNOSIS — Z5900 Homelessness unspecified: Secondary | ICD-10-CM | POA: Insufficient documentation

## 2013-06-29 DIAGNOSIS — G8929 Other chronic pain: Secondary | ICD-10-CM | POA: Insufficient documentation

## 2013-06-29 DIAGNOSIS — Z880 Allergy status to penicillin: Secondary | ICD-10-CM | POA: Insufficient documentation

## 2013-06-29 DIAGNOSIS — J45909 Unspecified asthma, uncomplicated: Secondary | ICD-10-CM | POA: Insufficient documentation

## 2013-06-29 DIAGNOSIS — R21 Rash and other nonspecific skin eruption: Secondary | ICD-10-CM | POA: Insufficient documentation

## 2013-06-29 DIAGNOSIS — I10 Essential (primary) hypertension: Secondary | ICD-10-CM | POA: Insufficient documentation

## 2013-06-29 DIAGNOSIS — Q828 Other specified congenital malformations of skin: Secondary | ICD-10-CM | POA: Insufficient documentation

## 2013-06-29 DIAGNOSIS — Z7982 Long term (current) use of aspirin: Secondary | ICD-10-CM | POA: Insufficient documentation

## 2013-06-29 NOTE — ED Notes (Signed)
Per EMS: pt c/o recurrent rash on bilateral forearms, scalp, abdomen and legs, pt keeps picking at scabs on rash. Pt given abx by PCP but pt feels abx is making her feel worse. Pt feels the abx is giving her an allergic reaction. Respirations equal and unlabored A&Ox4, respirations equal and unlabored.

## 2013-06-29 NOTE — ED Notes (Signed)
Pt states she has rash all over, swelling in her legs and HA.

## 2013-06-30 ENCOUNTER — Emergency Department (HOSPITAL_COMMUNITY)
Admission: EM | Admit: 2013-06-30 | Discharge: 2013-06-30 | Disposition: A | Discharge status: Home / Self Care | Payer: Medicaid Other | Attending: Emergency Medicine | Admitting: Emergency Medicine

## 2013-06-30 DIAGNOSIS — R21 Rash and other nonspecific skin eruption: Secondary | ICD-10-CM

## 2013-06-30 MED ORDER — PREDNISONE 20 MG PO TABS
60.0000 mg | ORAL_TABLET | Freq: Once | ORAL | Status: AC
  Administered 2013-06-30: 60 mg via ORAL
  Filled 2013-06-30: qty 3

## 2013-06-30 MED ORDER — DIPHENHYDRAMINE HCL 25 MG PO CAPS
25.0000 mg | ORAL_CAPSULE | Freq: Once | ORAL | Status: AC
  Administered 2013-06-30: 25 mg via ORAL
  Filled 2013-06-30: qty 1

## 2013-06-30 MED ORDER — DIPHENHYDRAMINE HCL 25 MG PO CAPS: 25.0000 mg | ORAL_CAPSULE | Freq: Four times a day (QID) | ORAL | Status: DC | PRN

## 2013-06-30 MED ORDER — PREDNISONE 20 MG PO TABS: 60.0000 mg | ORAL_TABLET | Freq: Every day | ORAL | Status: DC

## 2013-06-30 NOTE — ED Provider Notes (Signed)
CSN: [id number]     Arrival date & time 06/29/13  2311 History   First MD Initiated Contact with Patient 06/30/13 0622     Chief Complaint  Patient presents with  . Rash     (Consider location/radiation/quality/duration/timing/severity/associated sxs/prior Treatment) HPI History provided by patient. Itchy rash all over, worse or lasts a few days with history of same in the past. She has been prescribed antibiotics for similar rash by her primary care physician. She denies any new medications. She denies any known allergens. No new soaps, detergents or foods. No fevers or chills. No facial swelling, tongue swelling, lip swelling, or throw swelling. Patient has chronic low back pain unchanged without trauma. No associated weakness or numbness. No incontinence of bowel or bladder.   Past Medical History  Diagnosis Date  . Asthma   . GERD (gastroesophageal reflux disease)   . Hypertension   . Homelessness   . Low back pain   . 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 Arthritis   . Gout   . Hyperlipemia   . Morbid obesity     uses motor wheel chair  . MRSA (methicillin resistant Staphylococcus aureus)     states about a year ago   Past Surgical History  Procedure Laterality Date  . Tubal ligation    . Cesarean section     Family History  Problem Relation Age of Onset  . Asthma Mother   . Diabetes Father   . Heart disease Father   . Stroke Father    History  Substance Use Topics  . Smoking status: Former Smoker    Types: Cigarettes    Quit date: 03/25/2003  . Smokeless tobacco: Former Systems developer    Quit date: 05/27/1978  . Alcohol Use: No   OB History   Grav Para Term Preterm Abortions TAB SAB Ect Mult Living                 Review of Systems  Constitutional: Negative for fever and chills.  Eyes: Negative for visual disturbance.  Respiratory: Negative for cough and stridor.   Cardiovascular: Negative for chest pain.  Gastrointestinal: Negative for vomiting  and abdominal pain.  Genitourinary: Negative for difficulty urinating.  Musculoskeletal: Negative for back pain, neck pain and neck stiffness.  Skin: Positive for rash.  Neurological: Negative for weakness and numbness.  All other systems reviewed and are negative.     Allergies  Ibuprofen; Adhesive; Azithromycin; Doxycycline; Penicillins; Sulfa antibiotics; and Sulfamethoxazole-trimethoprim  Home Medications   Current Outpatient Rx  Name  Route  Sig  Dispense  Refill  . acetaminophen (TYLENOL) 325 MG tablet   Oral   Take 650 mg by mouth every 6 (six) hours as needed for mild pain or headache.         . albuterol (PROVENTIL HFA) 108 (90 BASE) MCG/ACT inhaler   Inhalation   Inhale 2 puffs into the lungs every 4 (four) hours as needed for wheezing or shortness of breath. For asthma relief         . albuterol (PROVENTIL) (2.5 MG/3ML) 0.083% nebulizer solution   Nebulization   Take 3 mLs (2.5 mg total) by nebulization every 6 (six) hours as needed for wheezing.   75 mL   0   . allopurinol (ZYLOPRIM) 100 MG tablet   Oral   Take 100 mg by mouth every morning.          [name] amitriptyline (ELAVIL) 75 MG tablet  Oral   Take 1 tablet (75 mg total) by mouth at bedtime.   30 tablet   0   . ammonium lactate (AMLACTIN) 12 % cream   Topical   Apply 1 g topically daily as needed for dry skin. Applies to back and legs.Marland KitchenMarland KitchenIn a thin layer on affected area for dry patches.         [name] aspirin 325 MG tablet   Oral   Take 325 mg by mouth every evening.          [name] atorvastatin (LIPITOR) 20 MG tablet   Oral   Take 20 mg by mouth every morning.          . clonazePAM (KLONOPIN) 1 MG tablet   Oral   Take 1 mg by mouth 2 (two) times daily.         . cloNIDine (CATAPRES) 0.2 MG tablet   Oral   Take 0.2 mg by mouth 2 (two) times daily.         . Emollient (EUCERIN) lotion   Topical   Apply 5 mLs topically 2 (two) times daily as needed for dry skin. On affected areas for  dry patches.         [name] HYDROcodone-acetaminophen (NORCO) 10-325 MG per tablet   Oral   Take 1 tablet by mouth every 6 (six) hours as needed for pain.         . hydrOXYzine (ATARAX/VISTARIL) 25 MG tablet   Oral   Take 1 tablet (25 mg total) by mouth every 6 (six) hours.   12 tablet   0   . ketoconazole (NIZORAL) 2 % shampoo   Topical   Apply 1 application topically 2 (two) times a week.         [name] ketoconazole (NIZORAL) 200 MG tablet   Oral   Take 200 mg by mouth daily.         [name] lisinopril (PRINIVIL,ZESTRIL) 40 MG tablet   Oral   Take 40 mg by mouth daily.         . naproxen (NAPROSYN) 500 MG tablet   Oral   Take 500 mg by mouth 2 (two) times daily with a meal.         . omega-3 acid ethyl esters (LOVAZA) 1 G capsule   Oral   Take 1 g by mouth 2 (two) times daily.          . pantoprazole (PROTONIX) 40 MG tablet   Oral   Take 40 mg by mouth every morning.          [name] PRESCRIPTION MEDICATION   Both Ears   Place 1 drop into both ears 3 (three) times daily.         . promethazine (PHENERGAN) 25 MG tablet   Oral   Take 25 mg by mouth every 6 (six) hours as needed for nausea. For nausea/vomiting         . solifenacin (VESICARE) 5 MG tablet   Oral   Take 5 mg by mouth every morning.          . traMADol (ULTRAM) 50 MG tablet   Oral   Take 1 tablet (50 mg total) by mouth every 6 (six) hours as needed.   6 tablet   0   . vitamin B-12 (CYANOCOBALAMIN) 1000 MCG tablet   Oral   Take 1,000 mcg by mouth daily.         . vitamin E 100 UNIT capsule  Oral   Take 100 Units by mouth every morning.         . diphenhydrAMINE (BENADRYL) 25 mg capsule   Oral   Take 25 mg by mouth every 6 (six) hours as needed for itching or allergies. For itching          BP 137/71  Pulse 79  Temp(Src) 98.1 F (36.7 C) (Oral)  Resp 19  Ht 5\' 5"  (1.651 m)  Wt 430 lb (195.047 kg)  BMI 71.56 kg/m2  SpO2 96%  LMP 03/24/2009 Physical Exam  Constitutional:  She is oriented to person, place, and time. She appears well-developed and well-nourished.  HENT:  Head: Normocephalic and atraumatic.  Mouth/Throat: Oropharynx is clear and moist.  Eyes: EOM are normal. Pupils are equal, round, and reactive to light.  Neck: Neck supple.  Cardiovascular: Normal rate, regular rhythm and intact distal pulses.   Pulmonary/Chest: Effort normal and breath sounds normal. No respiratory distress.  Abdominal:  Morbidly obese  Musculoskeletal: Normal range of motion. She exhibits no tenderness.  Neurological: She is alert and oriented to person, place, and time.  Skin: Skin is warm and dry.  Multiple erythematous lesions to torso and extremities  - most of these lesions are superficial and open scabs consistent with skin picking. No areas of fluctuance or increased warmth to touch. No areas of significant tenderness. No petechiae or purpura.    ED Course  Procedures (including critical care time) Labs Review Labs Reviewed - No data to display Imaging Review No results found.   EKG Interpretation   Date/Time:  Thursday June 30 2013 04:48:33 EDT Ventricular Rate:  76 PR Interval:  148 QRS Duration: 106 QT Interval:  414 QTC Calculation: 465 R Axis:   45 Text Interpretation:  Sinus rhythm Low voltage, precordial leads No  significant change since last tracing Confirmed by Malerie Eakins  MD, Larysa Pall  [id number]) on 06/30/2013 6:00:58 AM     Benadryl and prednisone provided.  Plan discharge home with prescription for Benadryl and prednisone, followup with primary care physician.   Patient is requesting to be admitted for her rash and chronic pain. She is requesting IV antibiotics and narcotic pain medications. Presentation does not suggest any need for antibiotics, narcotics or admission at this time. She states understanding return precautions and agrees to call her doctor for followup.   MDM   Diagnosis: Rash  This appears to be a chronic rash and patient states  she's had this before. Review of EMR record she has had MRSA in the past. There are no areas or lesions to suggest acute cellulitis. No fluctuant or tender areas to suggest abscess. No anaphylaxis/ airway compromise.   Medications provided Vital signs and nursing notes reviewed and considered   Teressa Lower, MD 06/30/13 [phone]

## 2013-06-30 NOTE — ED Notes (Signed)
MD at bedside. 

## 2013-06-30 NOTE — Discharge Instructions (Signed)
Keep a food diary. Take medications as prescribed. Call to schedule followup with your primary care physician.  Contact Dermatitis  Contact dermatitis is a reaction to certain substances that touch the skin. Contact dermatitis can be either irritant contact dermatitis or allergic contact dermatitis. Irritant contact dermatitis does not require previous exposure to the substance for a reaction to occur. Allergic contact dermatitis only occurs if you have been exposed to the substance before. Upon a repeat exposure, your body reacts to the substance.  CAUSES  Many substances can cause contact dermatitis. Irritant dermatitis is most commonly caused by repeated exposure to mildly irritating substances, such as:  Makeup.  Soaps.  Detergents.  Bleaches.  Acids.  Metal salts, such as nickel. Allergic contact dermatitis is most commonly caused by exposure to:  Poisonous plants.  Chemicals (deodorants, shampoos).  Jewelry.  Latex.  Neomycin in triple antibiotic cream.  Preservatives in products, including clothing. SYMPTOMS  The area of skin that is exposed may develop:  Dryness or flaking.  Redness.  Cracks.  Itching.  Pain or a burning sensation.  Blisters. With allergic contact dermatitis, there may also be swelling in areas such as the eyelids, mouth, or genitals.  DIAGNOSIS  Your caregiver can usually tell what the problem is by doing a physical exam. In cases where the cause is uncertain and an allergic contact dermatitis is suspected, a patch skin test may be performed to help determine the cause of your dermatitis.  TREATMENT  Treatment includes protecting the skin from further contact with the irritating substance by avoiding that substance if possible. Barrier creams, powders, and gloves may be helpful. Your caregiver may also recommend:  Steroid creams or ointments applied 2 times daily. For best results, soak the rash area in cool water for 20 minutes. Then apply the medicine.  Cover the area with a plastic wrap. You can store the steroid cream in the refrigerator for a "chilly" effect on your rash. That may decrease itching. Oral steroid medicines may be needed in more severe cases.  Antibiotics or antibacterial ointments if a skin infection is present.  Antihistamine lotion or an antihistamine taken by mouth to ease itching.  Lubricants to keep moisture in your skin.  Burow's solution to reduce redness and soreness or to dry a weeping rash. Mix one packet or tablet of solution in 2 cups cool water. Dip a clean washcloth in the mixture, wring it out a bit, and put it on the affected area. Leave the cloth in place for 30 minutes. Do this as often as possible throughout the day.  Taking several cornstarch or baking soda baths daily if the area is too large to cover with a washcloth. Harsh chemicals, such as alkalis or acids, can cause skin damage that is like a burn. You should flush your skin for 15 to 20 minutes with cold water after such an exposure. You should also seek immediate medical care after exposure. Bandages (dressings), antibiotics, and pain medicine may be needed for severely irritated skin.  HOME CARE INSTRUCTIONS  Avoid the substance that caused your reaction.  Keep the area of skin that is affected away from hot water, soap, sunlight, chemicals, acidic substances, or anything else that would irritate your skin.  Do not scratch the rash. Scratching may cause the rash to become infected.  You may take cool baths to help stop the itching.  Only take over-the-counter or prescription medicines as directed by your caregiver.  See your caregiver for follow-up care as  directed to make sure your skin is healing properly. SEEK MEDICAL CARE IF:  Your condition is not better after 3 days of treatment.  You seem to be getting worse.  You see signs of infection such as swelling, tenderness, redness, soreness, or warmth in the affected area.  You have any problems  related to your medicines. Document Released: 03/07/2000 Document Revised: 06/02/2011 Document Reviewed: 08/13/2010  [facility] Patient Information 2014 [place], [place].

## 2013-06-30 NOTE — ED Notes (Signed)
Pt c/o SOB; EKG done

## 2013-08-01 IMAGING — CR DG CHEST 2V
2 series · 2 of 2 positions shown · non-contrast
Comparison: Chest radiograph performed 12/10/2010

CLINICAL DATA: Mid chest pain and shortness of breath for 24 hours.

CHEST - 2 VIEW

[w chest lat]
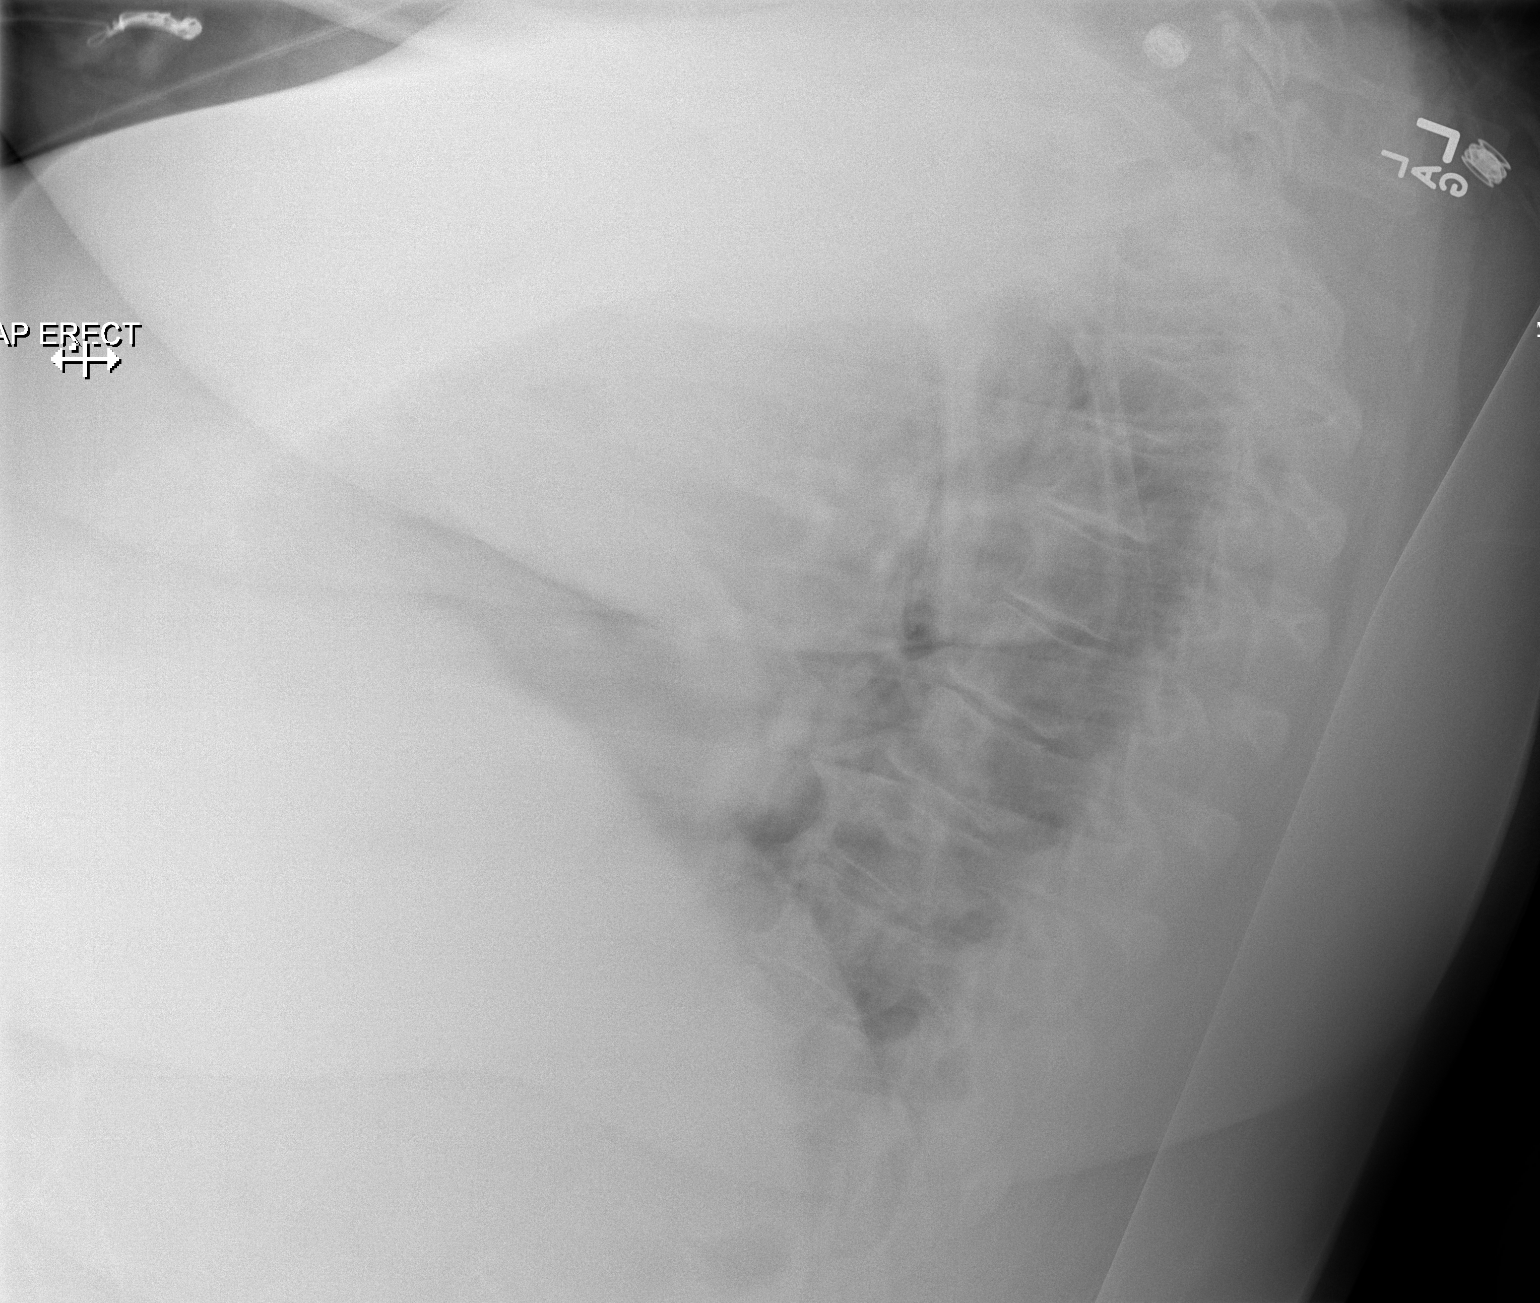

[x chest ap]
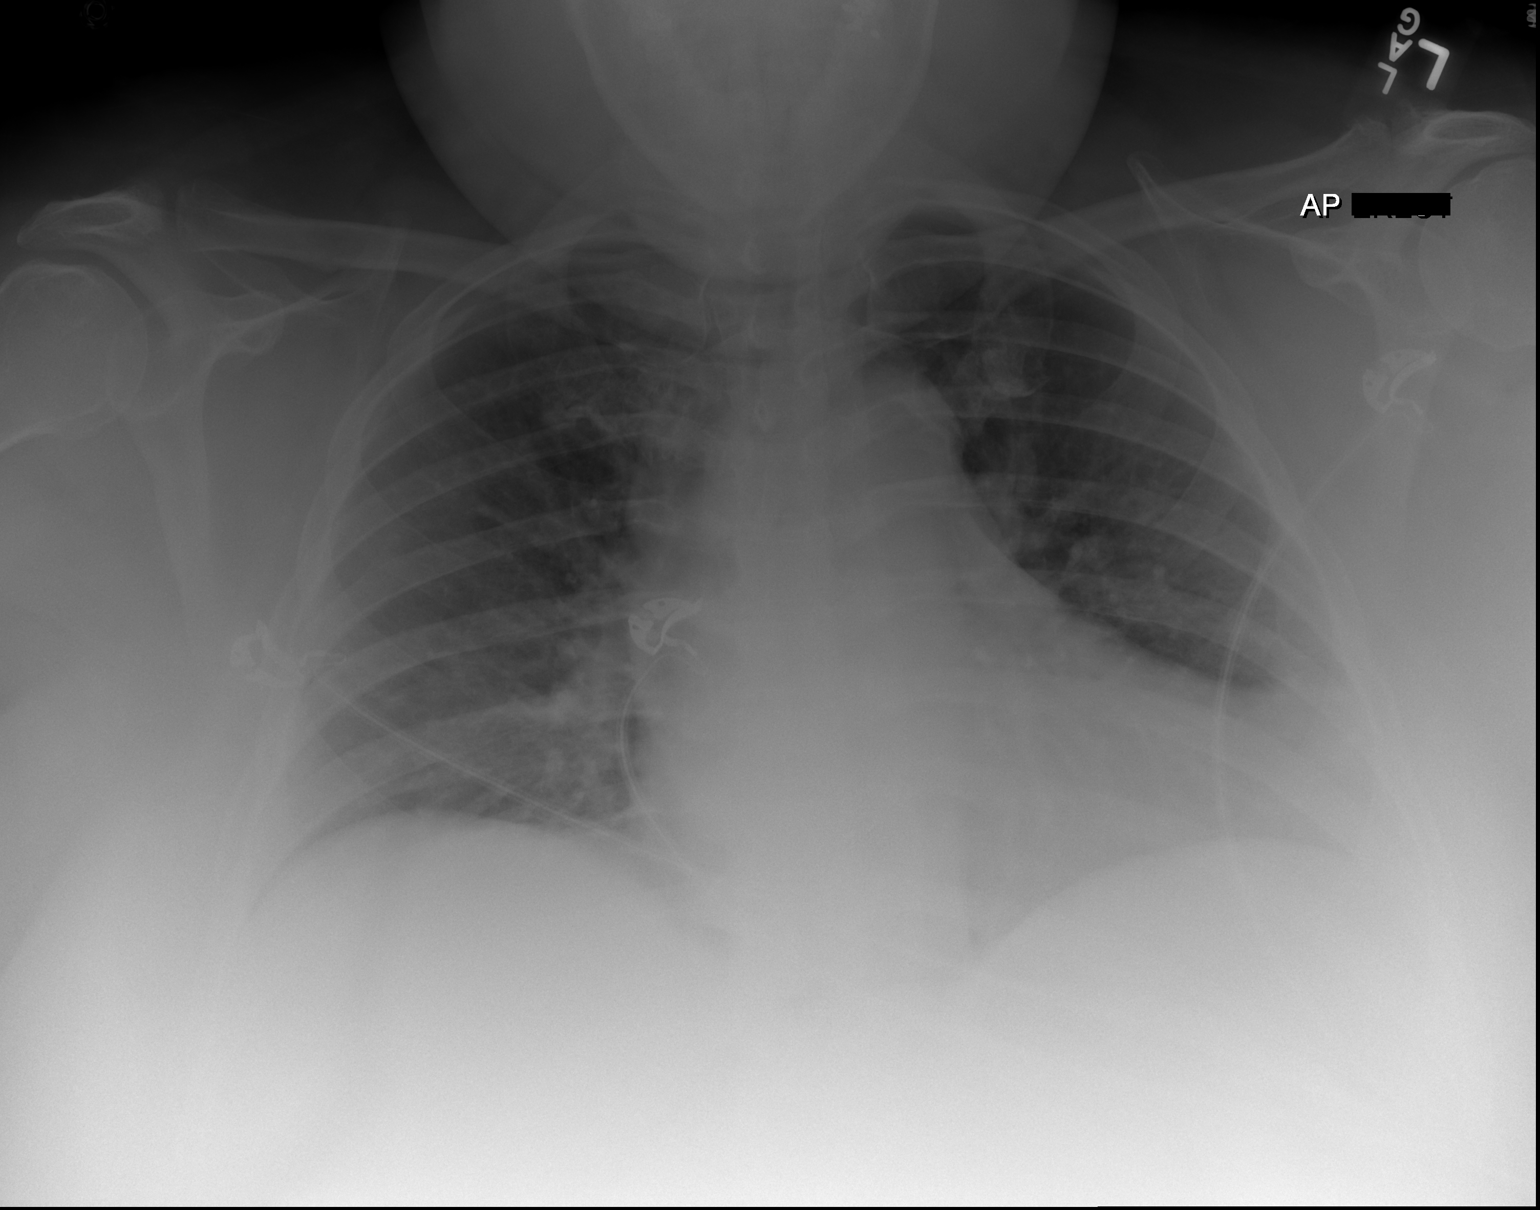

[2 of 2 positions shown; findings below may reference images not displayed]

FINDINGS: The lungs are hypoexpanded; vascular congestion is noted,
without significant pulmonary edema.  There is no evidence of
pleural effusion or pneumothorax.

The heart is mildly enlarged; the mediastinal contour is within
normal limits.  No acute osseous abnormalities are seen.
IMPRESSION: Lungs hypoexpanded; vascular congestion and mild cardiomegaly
noted, without significant pulmonary edema.

## 2013-08-03 IMAGING — CT CT ANGIO CHEST
2 of 5 series · 19 of 36 positions shown · IV contrast (omnipaque)
Comparison: Chest x-ray 01/13/2011

CLINICAL DATA: Short of breath.  Rule out pulmonary embolism

CT ANGIOGRAPHY CHEST WITH CONTRAST
TECHNIQUE: Multidetector CT imaging of the chest was performed
using the standard protocol during bolus administration of
intravenous contrast.  Multiplanar CT image reconstructions
including MIPs were obtained to evaluate the vascular anatomy.
Contrast: 100mL OMNIPAQUE IOHEXOL 300 MG/ML IV SOLN

[Series 5: pe_xxl @ 3mm · axial · 0.77mm/px · z∈[-192,+15]mm · 18 of 75 slices shown]
[im 3/75  lung]
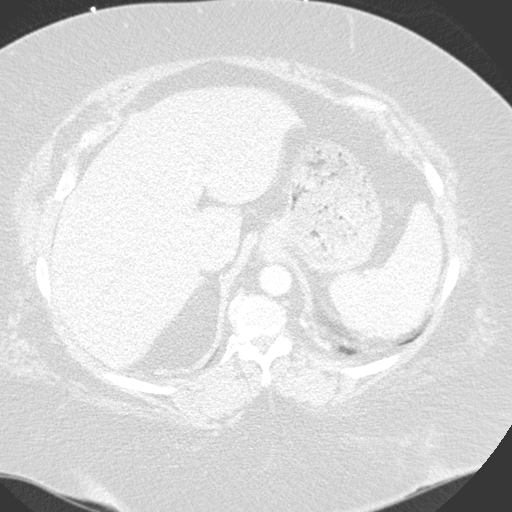
[im 9/75  mediastinal]
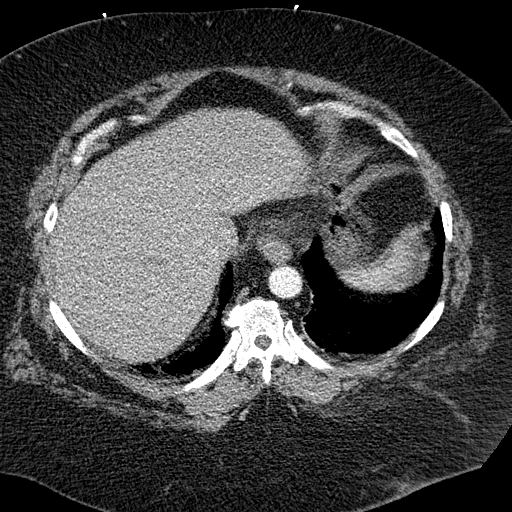
[im 11/75  lung]
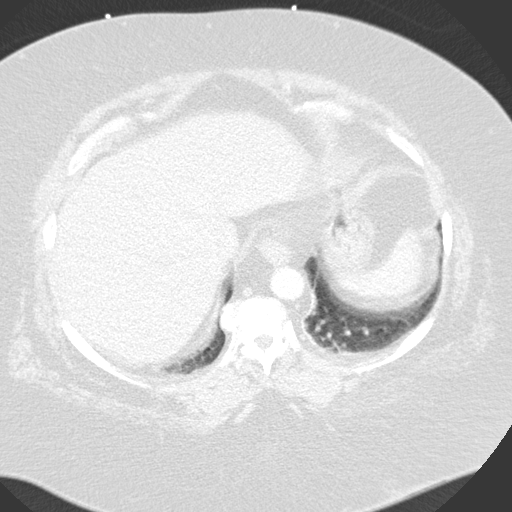
[im 17/75  mediastinal]
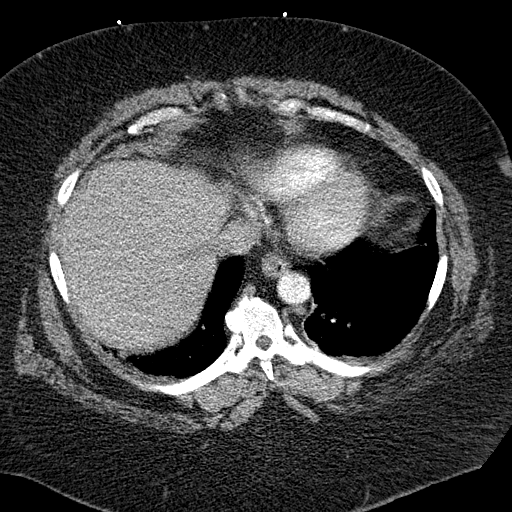
[im 20/75  lung]
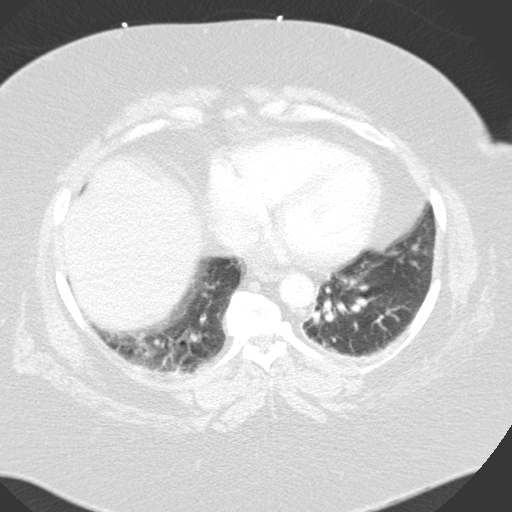
[im 22/75  mediastinal]
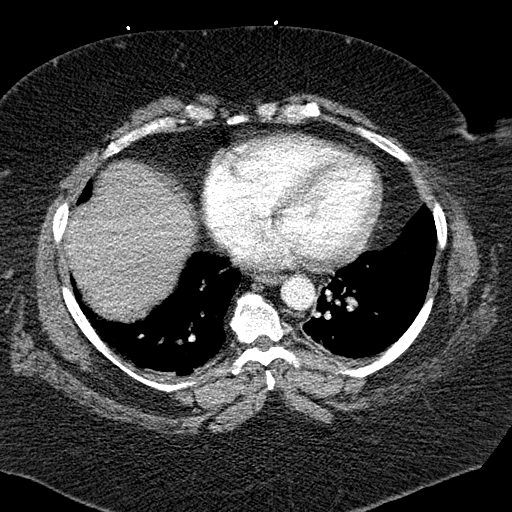
[im 28/75  lung]
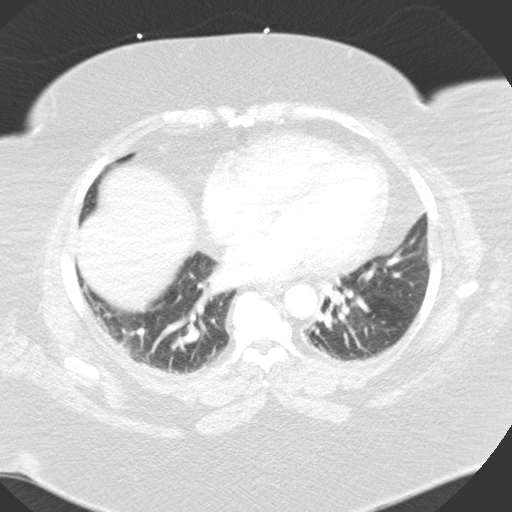
[im 31/75  mediastinal]
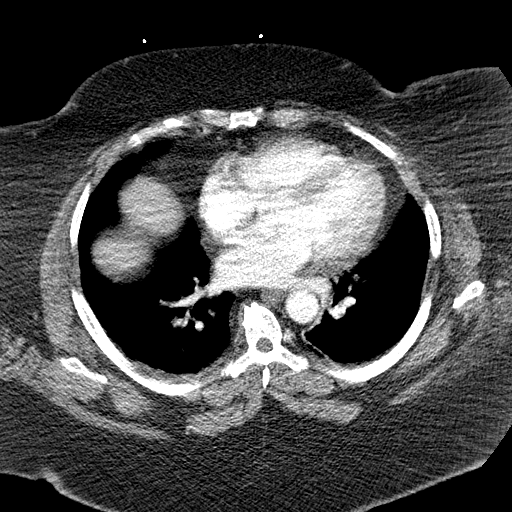
[im 36/75  lung]
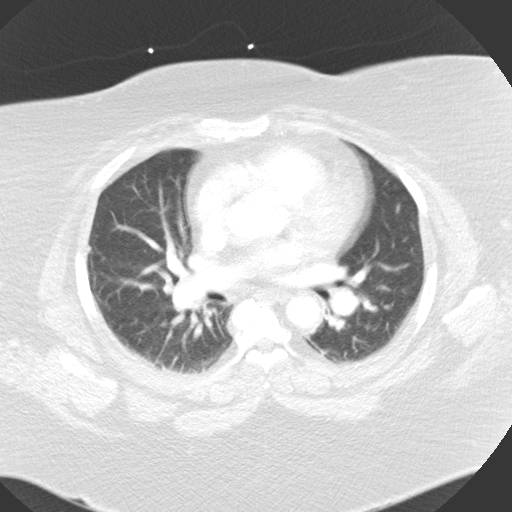
[im 39/75  mediastinal]
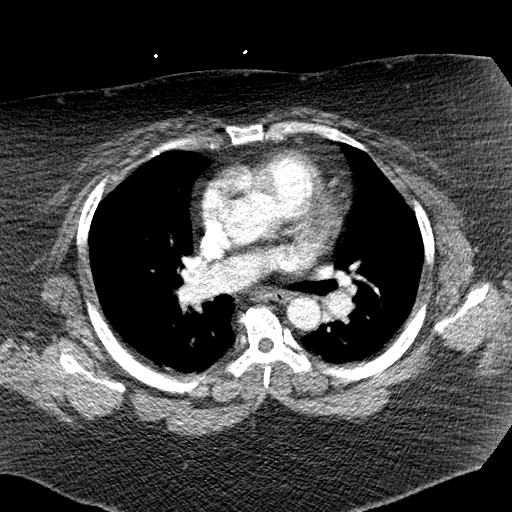
[im 44/75  lung]
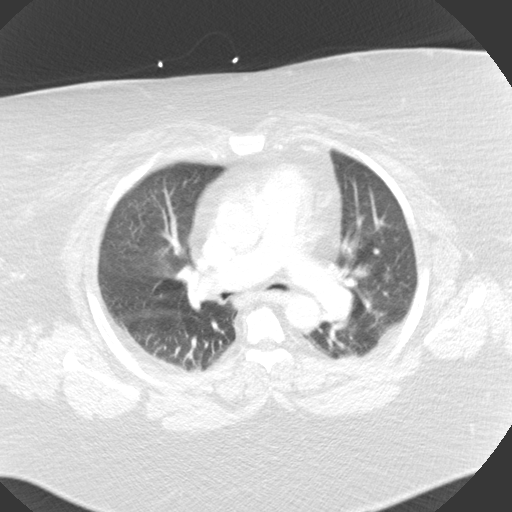
[im 47/75  mediastinal]
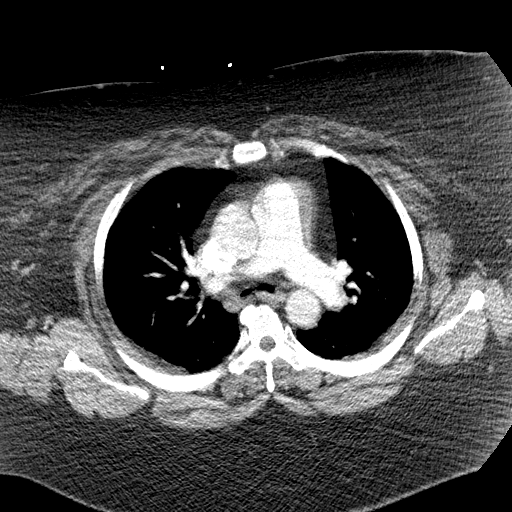
[im 53/75  lung]
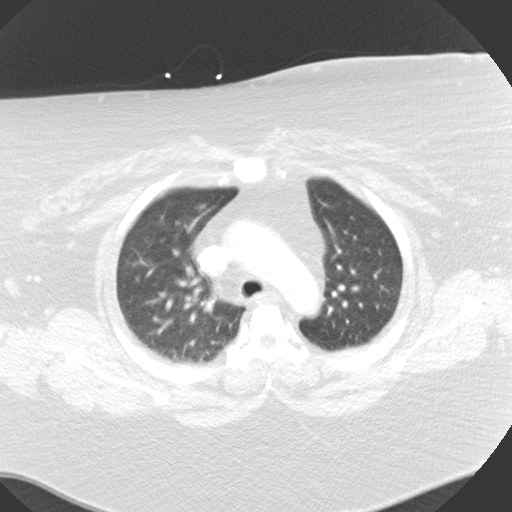
[im 55/75  mediastinal]
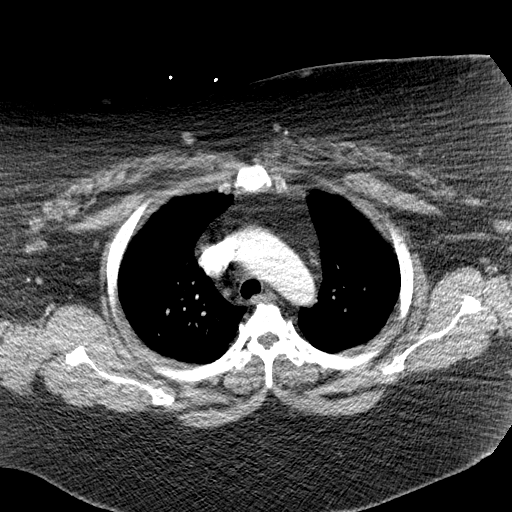
[im 58/75  lung]
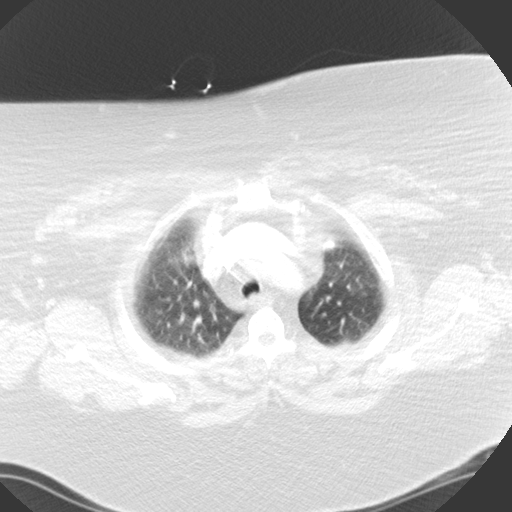
[im 64/75  mediastinal]
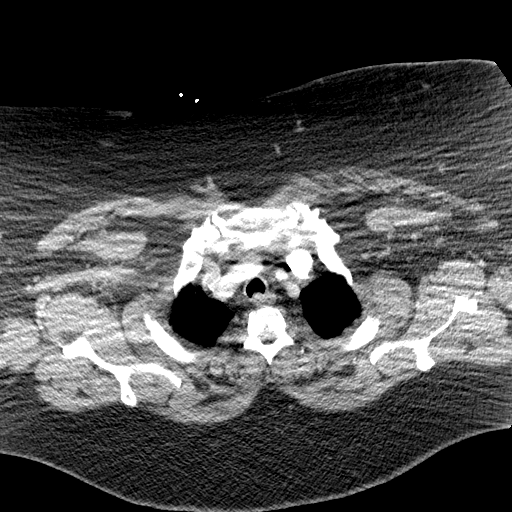
[im 66/75  lung]
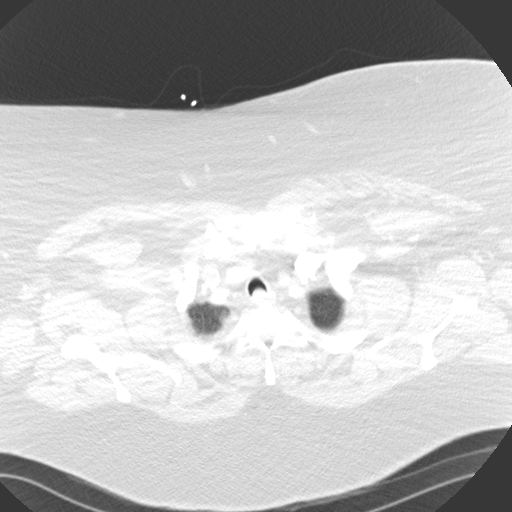
[im 72/75  mediastinal]
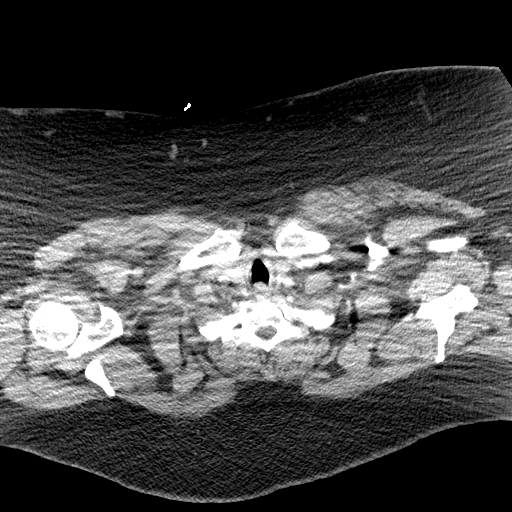

[Series 602: cor · coronal · 0.77mm/px · 1 of 108 slices shown]
[im 54/108  mediastinal]
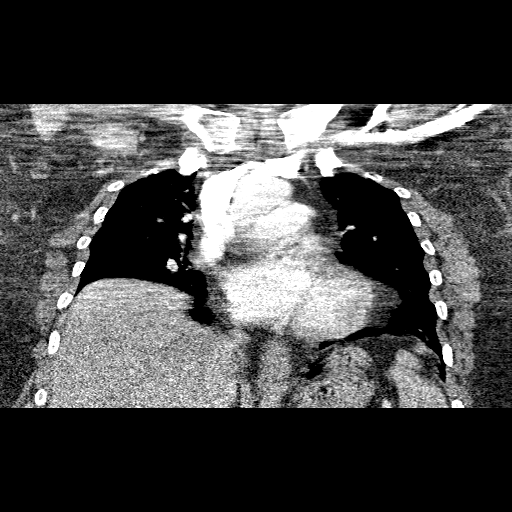

[19 of 36 positions shown; findings below may reference images not displayed]

FINDINGS: Image quality is limited by morbid obesity as well as
breathing motion throughout the study.

Negative for pulmonary embolism.  Small peripheral emboli could be
missed on this study.  Aortic arch is intact.  Heart is enlarged
without pericardial effusion.

Negative for infiltrate or effusion.  Lungs are clear.  Negative
for mass or adenopathy.  The no acute bony abnormality.

Review of the MIP images confirms the above findings.
IMPRESSION: Negative for pulmonary emboli.  No acute abnormality.

## 2013-08-30 ENCOUNTER — Inpatient Hospital Stay (HOSPITAL_COMMUNITY)
Admission: EM | Admit: 2013-08-30 | Discharge: 2013-09-02 | DRG: 291 | Disposition: A | Discharge status: Home Health | Payer: Medicaid Other | Attending: Internal Medicine | Admitting: Internal Medicine

## 2013-08-30 ENCOUNTER — Encounter (HOSPITAL_COMMUNITY): Payer: Self-pay | Admitting: Emergency Medicine

## 2013-08-30 ENCOUNTER — Emergency Department (HOSPITAL_COMMUNITY): Payer: Medicaid Other

## 2013-08-30 DIAGNOSIS — I5031 Acute diastolic (congestive) heart failure: Secondary | ICD-10-CM | POA: Diagnosis present

## 2013-08-30 DIAGNOSIS — Z833 Family history of diabetes mellitus: Secondary | ICD-10-CM

## 2013-08-30 DIAGNOSIS — L739 Follicular disorder, unspecified: Secondary | ICD-10-CM

## 2013-08-30 DIAGNOSIS — G8929 Other chronic pain: Secondary | ICD-10-CM

## 2013-08-30 DIAGNOSIS — E860 Dehydration: Secondary | ICD-10-CM

## 2013-08-30 DIAGNOSIS — N183 Chronic kidney disease, stage 3 unspecified: Secondary | ICD-10-CM | POA: Diagnosis present

## 2013-08-30 DIAGNOSIS — K219 Gastro-esophageal reflux disease without esophagitis: Secondary | ICD-10-CM

## 2013-08-30 DIAGNOSIS — L0293 Carbuncle, unspecified: Secondary | ICD-10-CM

## 2013-08-30 DIAGNOSIS — J9600 Acute respiratory failure, unspecified whether with hypoxia or hypercapnia: Secondary | ICD-10-CM | POA: Diagnosis present

## 2013-08-30 DIAGNOSIS — N189 Chronic kidney disease, unspecified: Secondary | ICD-10-CM

## 2013-08-30 DIAGNOSIS — R0902 Hypoxemia: Secondary | ICD-10-CM | POA: Diagnosis present

## 2013-08-30 DIAGNOSIS — L0292 Furuncle, unspecified: Secondary | ICD-10-CM

## 2013-08-30 DIAGNOSIS — Z8249 Family history of ischemic heart disease and other diseases of the circulatory system: Secondary | ICD-10-CM

## 2013-08-30 DIAGNOSIS — I5033 Acute on chronic diastolic (congestive) heart failure: Principal | ICD-10-CM | POA: Diagnosis present

## 2013-08-30 DIAGNOSIS — E875 Hyperkalemia: Secondary | ICD-10-CM

## 2013-08-30 DIAGNOSIS — R21 Rash and other nonspecific skin eruption: Secondary | ICD-10-CM | POA: Diagnosis present

## 2013-08-30 DIAGNOSIS — J45902 Unspecified asthma with status asthmaticus: Secondary | ICD-10-CM

## 2013-08-30 DIAGNOSIS — Z79899 Other long term (current) drug therapy: Secondary | ICD-10-CM

## 2013-08-30 DIAGNOSIS — G4733 Obstructive sleep apnea (adult) (pediatric): Secondary | ICD-10-CM | POA: Diagnosis present

## 2013-08-30 DIAGNOSIS — Z8614 Personal history of Methicillin resistant Staphylococcus aureus infection: Secondary | ICD-10-CM

## 2013-08-30 DIAGNOSIS — J189 Pneumonia, unspecified organism: Secondary | ICD-10-CM

## 2013-08-30 DIAGNOSIS — I509 Heart failure, unspecified: Secondary | ICD-10-CM | POA: Diagnosis present

## 2013-08-30 DIAGNOSIS — I5032 Chronic diastolic (congestive) heart failure: Secondary | ICD-10-CM | POA: Diagnosis present

## 2013-08-30 DIAGNOSIS — Z7982 Long term (current) use of aspirin: Secondary | ICD-10-CM

## 2013-08-30 DIAGNOSIS — R0602 Shortness of breath: Secondary | ICD-10-CM | POA: Diagnosis present

## 2013-08-30 DIAGNOSIS — N179 Acute kidney failure, unspecified: Secondary | ICD-10-CM

## 2013-08-30 DIAGNOSIS — I10 Essential (primary) hypertension: Secondary | ICD-10-CM | POA: Diagnosis present

## 2013-08-30 DIAGNOSIS — I959 Hypotension, unspecified: Secondary | ICD-10-CM

## 2013-08-30 DIAGNOSIS — E662 Morbid (severe) obesity with alveolar hypoventilation: Secondary | ICD-10-CM | POA: Diagnosis present

## 2013-08-30 DIAGNOSIS — G4700 Insomnia, unspecified: Secondary | ICD-10-CM

## 2013-08-30 DIAGNOSIS — M549 Dorsalgia, unspecified: Secondary | ICD-10-CM

## 2013-08-30 DIAGNOSIS — R197 Diarrhea, unspecified: Secondary | ICD-10-CM

## 2013-08-30 DIAGNOSIS — J45909 Unspecified asthma, uncomplicated: Secondary | ICD-10-CM

## 2013-08-30 DIAGNOSIS — I129 Hypertensive chronic kidney disease with stage 1 through stage 4 chronic kidney disease, or unspecified chronic kidney disease: Secondary | ICD-10-CM | POA: Diagnosis present

## 2013-08-30 DIAGNOSIS — E785 Hyperlipidemia, unspecified: Secondary | ICD-10-CM | POA: Diagnosis present

## 2013-08-30 DIAGNOSIS — Z87891 Personal history of nicotine dependence: Secondary | ICD-10-CM

## 2013-08-30 DIAGNOSIS — D72829 Elevated white blood cell count, unspecified: Secondary | ICD-10-CM

## 2013-08-30 DIAGNOSIS — Z6841 Body Mass Index (BMI) 40.0 and over, adult: Secondary | ICD-10-CM

## 2013-08-30 DIAGNOSIS — N390 Urinary tract infection, site not specified: Secondary | ICD-10-CM

## 2013-08-30 DIAGNOSIS — Z9981 Dependence on supplemental oxygen: Secondary | ICD-10-CM

## 2013-08-30 DIAGNOSIS — N3941 Urge incontinence: Secondary | ICD-10-CM

## 2013-08-30 DIAGNOSIS — Z823 Family history of stroke: Secondary | ICD-10-CM

## 2013-08-30 DIAGNOSIS — Q828 Other specified congenital malformations of skin: Secondary | ICD-10-CM

## 2013-08-30 HISTORY — DX: Heart failure, unspecified: I50.9

## 2013-08-30 LAB — TROPONIN I: Troponin I: <0.3 ng/mL (ref <0.30)

## 2013-08-30 LAB — CBC WITH DIFFERENTIAL/PLATELET
Basophils Absolute: 0 10*3/uL (ref 0.0–0.1)
Basophils Relative: 0 % (ref 0–1)
Eosinophils Absolute: 0.2 10*3/uL (ref 0.0–0.7)
Eosinophils Relative: 2 % (ref 0–5)
HEMATOCRIT: 40.5 % (ref 36.0–46.0)
Hemoglobin: 12.2 g/dL (ref 12.0–15.0)
LYMPHS PCT: 17 % (ref 12–46)
Lymphs Abs: 1.8 10*3/uL (ref 0.7–4.0)
MCH: 28.8 pg (ref 26.0–34.0)
MCHC: 30.1 g/dL (ref 30.0–36.0)
MCV: 95.7 fL (ref 78.0–100.0)
Monocytes Absolute: 0.6 10*3/uL (ref 0.1–1.0)
Monocytes Relative: 6 % (ref 3–12)
NEUTROS ABS: 7.8 10*3/uL — AB (ref 1.7–7.7)
NEUTROS PCT: 75 % (ref 43–77)
Platelets: 218 10*3/uL (ref 150–400)
RBC: 4.23 MIL/uL (ref 3.87–5.11)
RDW: 14.4 % (ref 11.5–15.5)
WBC: 10.4 10*3/uL (ref 4.0–10.5)

## 2013-08-30 LAB — BASIC METABOLIC PANEL
BUN: 13 mg/dL (ref 6–23)
CO2: 38 meq/L — AB (ref 19–32)
Calcium: 8.8 mg/dL (ref 8.4–10.5)
Chloride: 99 mEq/L (ref 96–112)
Creatinine, Ser: 1.24 mg/dL — ABNORMAL HIGH (ref 0.50–1.10)
GFR calc non Af Amer: 50 mL/min — ABNORMAL LOW (ref >90)
GFR, EST AFRICAN AMERICAN: 58 mL/min — AB (ref >90)
Glucose, Bld: 93 mg/dL (ref 70–99)
POTASSIUM: 4.2 meq/L (ref 3.7–5.3)
SODIUM: 145 meq/L (ref 137–147)

## 2013-08-30 LAB — PRO B NATRIURETIC PEPTIDE: PRO B NATRI PEPTIDE: 584.4 pg/mL — AB (ref 0–125)

## 2013-08-30 MED ORDER — FUROSEMIDE 10 MG/ML IJ SOLN: 40.0000 mg | Freq: Once | INTRAMUSCULAR | Status: DC

## 2013-08-30 MED ORDER — ALBUTEROL SULFATE (2.5 MG/3ML) 0.083% IN NEBU
5.0000 mg | INHALATION_SOLUTION | Freq: Once | RESPIRATORY_TRACT | Status: AC
  Administered 2013-08-30: 5 mg via RESPIRATORY_TRACT
  Filled 2013-08-30: qty 6

## 2013-08-30 NOTE — ED Notes (Signed)
Pt reports bila LE swelling and rash all over her body x 2-3 weeks.  Pt also reports SOB, normally uses home O2

## 2013-08-30 NOTE — ED Notes (Signed)
Pt arrived with multiple complaints. Pt c/o Bilateral leg swelling and SOB x 3 weeks as well as a rash all over body. Pt also c/o ongoing headache on the R side of the head. A&Ox4. Pt sts she is normally on 3L O2 at home but has not been today.

## 2013-08-30 NOTE — H&P (Signed)
PCP:   Harvie Junior, MD   Chief Complaint:  sob  HPI: 48 yo female h/o chf diastolic, htn, morbid obesity, osa comes in with several days of worsening sob and a rash on her legs.  Her lasix was stopped over a week ago for several days, then it was restarted.  Her swelling has gotten much worse in her legs.  She does not know why her lasix was stopped (assuming due to worsening renal function).  She has developed a rash on her legs from all the swelling.  No fevers.  No n/v/d.  No cough.  She is on 3 Liters of oxygen at home for several years.    Review of Systems:  Positive and negative as per HPI otherwise all other systems are negative  Past Medical History: Past Medical History  Diagnosis Date  . Asthma   . GERD (gastroesophageal reflux disease)   . Hypertension   . Homelessness   . Low back pain   . 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 Arthritis   . Gout   . Hyperlipemia   . Morbid obesity     uses motor wheel chair  . MRSA (methicillin resistant Staphylococcus aureus)     states about a year ago  . CHF (congestive heart failure)    Past Surgical History  Procedure Laterality Date  . Tubal ligation    . Cesarean section      Medications: Prior to Admission medications   Medication Sig Start Date End Date Taking? Authorizing Provider  acetaminophen (TYLENOL) 325 MG tablet Take 650 mg by mouth every 6 (six) hours as needed for mild pain or headache.   Yes Historical Provider, MD  albuterol (PROVENTIL HFA) 108 (90 BASE) MCG/ACT inhaler Inhale 2 puffs into the lungs every 4 (four) hours as needed for wheezing or shortness of breath. For asthma relief   Yes Historical Provider, MD  albuterol (PROVENTIL) (2.5 MG/3ML) 0.083% nebulizer solution Take 3 mLs (2.5 mg total) by nebulization every 6 (six) hours as needed for wheezing. 08/01/12 09/21/13 Yes Annita Brod, MD  allopurinol (ZYLOPRIM) 100 MG tablet Take 100 mg by mouth every morning.    Yes  Historical Provider, MD  amitriptyline (ELAVIL) 75 MG tablet Take 1 tablet (75 mg total) by mouth at bedtime. 03/23/12  Yes Marianne L York, PA-C  ammonium lactate (AMLACTIN) 12 % cream Apply 1 g topically daily as needed for dry skin. Applies to back and legs.Marland KitchenMarland KitchenIn a thin layer on affected area for dry patches.   Yes Historical Provider, MD  aspirin 325 MG tablet Take 325 mg by mouth every evening.    Yes Historical Provider, MD  atorvastatin (LIPITOR) 20 MG tablet Take 20 mg by mouth every morning.    Yes Historical Provider, MD  clonazePAM (KLONOPIN) 1 MG tablet Take 1 mg by mouth 2 (two) times daily.   Yes Historical Provider, MD  cloNIDine (CATAPRES) 0.2 MG tablet Take 0.2 mg by mouth 2 (two) times daily.   Yes Historical Provider, MD  diphenhydrAMINE (BENADRYL) 25 mg capsule Take 25 mg by mouth every 6 (six) hours as needed for itching or allergies. For itching   Yes Historical Provider, MD  Emollient (EUCERIN) lotion Apply 5 mLs topically 2 (two) times daily as needed for dry skin. On affected areas for dry patches.   Yes Historical Provider, MD  HYDROcodone-acetaminophen (NORCO) 10-325 MG per tablet Take 1 tablet by mouth every 6 (six) hours  as needed for pain.   Yes Historical Provider, MD  hydrOXYzine (ATARAX/VISTARIL) 25 MG tablet Take 1 tablet (25 mg total) by mouth every 6 (six) hours. 06/12/13  Yes Teressa Lower, MD  ketoconazole (NIZORAL) 2 % shampoo Apply 1 application topically 2 (two) times a week.   Yes Historical Provider, MD  ketoconazole (NIZORAL) 200 MG tablet Take 200 mg by mouth daily.   Yes Historical Provider, MD  lisinopril (PRINIVIL,ZESTRIL) 40 MG tablet Take 40 mg by mouth daily.   Yes Historical Provider, MD  naproxen (NAPROSYN) 500 MG tablet Take 500 mg by mouth 2 (two) times daily with a meal.   Yes Historical Provider, MD  omega-3 acid ethyl esters (LOVAZA) 1 G capsule Take 1 g by mouth 2 (two) times daily.    Yes Historical Provider, MD  pantoprazole (PROTONIX) 40 MG  tablet Take 40 mg by mouth every morning.    Yes Historical Provider, MD  PRESCRIPTION MEDICATION Place 1 drop into both ears 3 (three) times daily.   Yes Historical Provider, MD  promethazine (PHENERGAN) 25 MG tablet Take 25 mg by mouth every 6 (six) hours as needed for nausea. For nausea/vomiting   Yes Historical Provider, MD  solifenacin (VESICARE) 5 MG tablet Take 5 mg by mouth every morning.    Yes Historical Provider, MD  vitamin B-12 (CYANOCOBALAMIN) 1000 MCG tablet Take 1,000 mcg by mouth daily.   Yes Historical Provider, MD  vitamin E 100 UNIT capsule Take 100 Units by mouth every morning.   Yes Historical Provider, MD    Allergies:   Allergies  Allergen Reactions  . Ibuprofen Hives, Shortness Of Breath and Palpitations  . Adhesive [Tape] Rash  . Azithromycin Hives and Rash  . Doxycycline Rash  . Penicillins Rash  . Sulfa Antibiotics Rash  . Sulfamethoxazole-Trimethoprim Rash    Social History:  reports that she quit smoking about 10 years ago. Her smoking use included Cigarettes. She smoked 0.00 packs per day. She quit smokeless tobacco use about 35 years ago. She reports that she does not drink alcohol or use illicit drugs.  Family History: Family History  Problem Relation Age of Onset  . Asthma Mother   . Diabetes Father   . Heart disease Father   . Stroke Father     Physical Exam: Filed Vitals:   08/30/13 2058 08/30/13 2217  BP: 118/48   Pulse: 81   Temp: 99.4 F (37.4 C)   TempSrc: Oral   Resp: 20   SpO2: 93% 88%   General appearance: alert, cooperative and no distress Head: Normocephalic, without obvious abnormality, atraumatic Eyes: negative Nose: Nares normal. Septum midline. Mucosa normal. No drainage or sinus tenderness. Neck: no JVD and supple, symmetrical, trachea midline Lungs: clear to auscultation bilaterally Heart: regular rate and rhythm, S1, S2 normal, no murmur, click, rub or gallop Abdomen: soft, non-tender; bowel sounds normal; no  masses,  no organomegaly Extremities: edema trace Pulses: 2+ and symmetric Skin: Skin color, texture, turgor normal. No rashes or lesions Neurologic: Grossly normal    Labs on Admission:   Recent Labs  08/30/13 2206  NA 145  K 4.2  CL 99  CO2 38*  GLUCOSE 93  BUN 13  CREATININE 1.24*  CALCIUM 8.8    Recent Labs  08/30/13 2206  WBC 10.4  NEUTROABS 7.8*  HGB 12.2  HCT 40.5  MCV 95.7  PLT 218    Recent Labs  08/30/13 2206  TROPONINI <0.30   Radiological Exams on Admission: Dg  Chest 2 View  08/30/2013   CLINICAL DATA:  Shortness of breath with upper abdominal pain.  EXAM: CHEST  2 VIEW  COMPARISON:  06/12/2013.  FINDINGS: Cardiomegaly. Moderate vascular congestion. Early pulmonary edema not excluded. No effusion or pneumothorax. Negative osseous structures. Worsening aeration.  IMPRESSION: Cardiomegaly. Moderate vascular congestion with possible early pulmonary edema.   Electronically Signed   By: Rolla Flatten M.D.   On: 08/30/2013 22:11    Assessment/Plan  48 yo female with acute on chronic diastolic chf exacerbation  Principal Problem:   Acute diastolic heart failure-  Mild exac.  Place on chf pathway and lasix 40mg  iv q 12 hours.  Will not repeat any imaging, as this is probably due to recent interuption in her diuretic usage.    Active Problems:  Stable unless o/w noted   HYPERTENSION   Morbid obesity   SOB (shortness of breath)   CKD (chronic kidney disease), stage III   Diastolic CHF, chronic  obs on med.  Full code.  Reyanne Hussar A Shanon Brow 08/30/2013, 11:28 PM

## 2013-08-30 NOTE — ED Notes (Signed)
Respiratory called for treatment.

## 2013-08-30 NOTE — ED Provider Notes (Signed)
CSN: [id number]     Arrival date & time 08/30/13  2049 History   First MD Initiated Contact with Patient 08/30/13 2136     Chief Complaint  Patient presents with  . Leg Swelling  . Rash     (Consider location/radiation/quality/duration/timing/severity/associated sxs/prior Treatment) Patient is a 48 y.o. female presenting with rash. The history is provided by the patient.  Rash Associated symptoms: shortness of breath   Associated symptoms: no abdominal pain, no diarrhea, no headaches, no nausea and not vomiting    patient presents with shortness of breath and rash. Patient states the rash been there for around 4 days but told the nurses in 2-3 weeks. His itching over her whole body. She states her legs and is now everywhere. Patient has had multiple visits for rash. When patient was asked she's had a rash at this before she said known. No fevers. She also states she's had increasing shortness of breath. She is on chronic oxygen at home. She states she's had some chest pain. She states she's been wheezing. She denies productive cough. Patient states she feels as if she is swollen everywhere.    Past Medical History  Diagnosis Date  . Asthma   . GERD (gastroesophageal reflux disease)   . Hypertension   . Homelessness   . Low back pain   . 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 Arthritis   . Gout   . Hyperlipemia   . Morbid obesity     uses motor wheel chair  . MRSA (methicillin resistant Staphylococcus aureus)     states about a year ago  . CHF (congestive heart failure)    Past Surgical History  Procedure Laterality Date  . Tubal ligation    . Cesarean section     Family History  Problem Relation Age of Onset  . Asthma Mother   . Diabetes Father   . Heart disease Father   . Stroke Father    History  Substance Use Topics  . Smoking status: Former Smoker    Types: Cigarettes    Quit date: 03/25/2003  . Smokeless tobacco: Former Systems developer    Quit date:  05/27/1978  . Alcohol Use: No   OB History   Grav Para Term Preterm Abortions TAB SAB Ect Mult Living                 Review of Systems  Constitutional: Negative for activity change and appetite change.  Eyes: Negative for pain.  Respiratory: Positive for shortness of breath. Negative for chest tightness.   Cardiovascular: Positive for leg swelling. Negative for chest pain.  Gastrointestinal: Negative for nausea, vomiting, abdominal pain and diarrhea.  Genitourinary: Negative for flank pain.  Musculoskeletal: Negative for back pain and neck stiffness.  Skin: Positive for rash.  Neurological: Negative for weakness, numbness and headaches.  Psychiatric/Behavioral: Negative for behavioral problems.      Allergies  Ibuprofen; Adhesive; Azithromycin; Doxycycline; Penicillins; Sulfa antibiotics; and Sulfamethoxazole-trimethoprim  Home Medications   Prior to Admission medications   Medication Sig Start Date End Date Taking? Authorizing Provider  acetaminophen (TYLENOL) 325 MG tablet Take 650 mg by mouth every 6 (six) hours as needed for mild pain or headache.   Yes Historical Provider, MD  albuterol (PROVENTIL HFA) 108 (90 BASE) MCG/ACT inhaler Inhale 2 puffs into the lungs every 4 (four) hours as needed for wheezing or shortness of breath. For asthma relief   Yes Historical Provider, MD  albuterol (  PROVENTIL) (2.5 MG/3ML) 0.083% nebulizer solution Take 3 mLs (2.5 mg total) by nebulization every 6 (six) hours as needed for wheezing. 08/01/12 09/21/13 Yes Annita Brod, MD  allopurinol (ZYLOPRIM) 100 MG tablet Take 100 mg by mouth every morning.    Yes Historical Provider, MD  amitriptyline (ELAVIL) 75 MG tablet Take 1 tablet (75 mg total) by mouth at bedtime. 03/23/12  Yes Marianne L York, PA-C  ammonium lactate (AMLACTIN) 12 % cream Apply 1 g topically daily as needed for dry skin. Applies to back and legs.Marland KitchenMarland KitchenIn a thin layer on affected area for dry patches.   Yes Historical Provider, MD   aspirin 325 MG tablet Take 325 mg by mouth every evening.    Yes Historical Provider, MD  atorvastatin (LIPITOR) 20 MG tablet Take 20 mg by mouth every morning.    Yes Historical Provider, MD  clonazePAM (KLONOPIN) 1 MG tablet Take 1 mg by mouth 2 (two) times daily.   Yes Historical Provider, MD  cloNIDine (CATAPRES) 0.2 MG tablet Take 0.2 mg by mouth 2 (two) times daily.   Yes Historical Provider, MD  diphenhydrAMINE (BENADRYL) 25 mg capsule Take 25 mg by mouth every 6 (six) hours as needed for itching or allergies. For itching   Yes Historical Provider, MD  Emollient (EUCERIN) lotion Apply 5 mLs topically 2 (two) times daily as needed for dry skin. On affected areas for dry patches.   Yes Historical Provider, MD  HYDROcodone-acetaminophen (NORCO) 10-325 MG per tablet Take 1 tablet by mouth every 6 (six) hours as needed for pain.   Yes Historical Provider, MD  hydrOXYzine (ATARAX/VISTARIL) 25 MG tablet Take 1 tablet (25 mg total) by mouth every 6 (six) hours. 06/12/13  Yes Teressa Lower, MD  ketoconazole (NIZORAL) 2 % shampoo Apply 1 application topically 2 (two) times a week.   Yes Historical Provider, MD  ketoconazole (NIZORAL) 200 MG tablet Take 200 mg by mouth daily.   Yes Historical Provider, MD  lisinopril (PRINIVIL,ZESTRIL) 40 MG tablet Take 40 mg by mouth daily.   Yes Historical Provider, MD  naproxen (NAPROSYN) 500 MG tablet Take 500 mg by mouth 2 (two) times daily with a meal.   Yes Historical Provider, MD  omega-3 acid ethyl esters (LOVAZA) 1 G capsule Take 1 g by mouth 2 (two) times daily.    Yes Historical Provider, MD  pantoprazole (PROTONIX) 40 MG tablet Take 40 mg by mouth every morning.    Yes Historical Provider, MD  PRESCRIPTION MEDICATION Place 1 drop into both ears 3 (three) times daily.   Yes Historical Provider, MD  promethazine (PHENERGAN) 25 MG tablet Take 25 mg by mouth every 6 (six) hours as needed for nausea. For nausea/vomiting   Yes Historical Provider, MD  solifenacin  (VESICARE) 5 MG tablet Take 5 mg by mouth every morning.    Yes Historical Provider, MD  vitamin B-12 (CYANOCOBALAMIN) 1000 MCG tablet Take 1,000 mcg by mouth daily.   Yes Historical Provider, MD  vitamin E 100 UNIT capsule Take 100 Units by mouth every morning.   Yes Historical Provider, MD   BP 130/67  Pulse 81  Temp(Src) 99.1 F (37.3 C) (Oral)  Resp 20  SpO2 94%  LMP 03/24/2009 Physical Exam  Nursing note and vitals reviewed. Constitutional: She is oriented to person, place, and time. She appears well-developed and well-nourished.  Patient is morbidly obese  HENT:  Head: Normocephalic and atraumatic.  Eyes: EOM are normal. Pupils are equal, round, and reactive to light.  Neck: Normal range of motion. Neck supple.  Cardiovascular: Normal rate, regular rhythm and normal heart sounds.   No murmur heard. Pulmonary/Chest: No respiratory distress. She has wheezes. She has no rales.  Upper airway wheezes.  Abdominal: Soft. Bowel sounds are normal. She exhibits no distension. There is no tenderness. There is no rebound and no guarding.  Musculoskeletal: Normal range of motion. She exhibits edema.  Some bilateral lower extremity pitting edema.  Neurological: She is alert and oriented to person, place, and time. No cranial nerve deficit.  Skin: Skin is warm and dry. Rash noted.  Diffuse areas of excoriation. No clear signs of infection.  Psychiatric: She has a normal mood and affect. Her speech is normal.    ED Course  Procedures (including critical care time) Labs Review Labs Reviewed  CBC WITH DIFFERENTIAL - Abnormal; Notable for the following:    Neutro Abs 7.8 (*)    All other components within normal limits  BASIC METABOLIC PANEL - Abnormal; Notable for the following:    CO2 38 (*)    Creatinine, Ser 1.24 (*)    GFR calc non Af Amer 50 (*)    GFR calc Af Amer 58 (*)    All other components within normal limits  PRO B NATRIURETIC PEPTIDE - Abnormal; Notable for the  following:    Pro B Natriuretic peptide (BNP) 584.4 (*)    All other components within normal limits  TROPONIN I    Imaging Review Dg Chest 2 View  08/30/2013   CLINICAL DATA:  Shortness of breath with upper abdominal pain.  EXAM: CHEST  2 VIEW  COMPARISON:  06/12/2013.  FINDINGS: Cardiomegaly. Moderate vascular congestion. Early pulmonary edema not excluded. No effusion or pneumothorax. Negative osseous structures. Worsening aeration.  IMPRESSION: Cardiomegaly. Moderate vascular congestion with possible early pulmonary edema.   Electronically Signed   By: Rolla Flatten M.D.   On: 08/30/2013 22:11     EKG Interpretation   Date/Time:  Tuesday August 30 2013 23:37:12 EDT Ventricular Rate:  92 PR Interval:  156 QRS Duration: 102 QT Interval:  374 QTC Calculation: 463 R Axis:   64 Text Interpretation:  Sinus rhythm Low voltage, precordial leads Confirmed  by Damoni Causby  MD, Darrall Strey [phone]) on 08/31/2013 12:19:19 AM      MDM   Final diagnoses:  CHF (congestive heart failure)    Patient with complaint of shortness of breath and peripheral edema. Patient also states that she has rash. Patient was found to be hypoxic, though is unclear if she was on her chronic oxygen or not. She has an x-ray that showed CHF an elevated BNP. Patient states she's been having more trouble breathing. Will admit as an obstetrical medicine.    Jasper Riling. Alvino Chapel, MD 08/31/13 [id number]

## 2013-08-31 DIAGNOSIS — I369 Nonrheumatic tricuspid valve disorder, unspecified: Secondary | ICD-10-CM

## 2013-08-31 LAB — BASIC METABOLIC PANEL
BUN: 18 mg/dL (ref 6–23)
CALCIUM: 8.6 mg/dL (ref 8.4–10.5)
CO2: 37 mEq/L — ABNORMAL HIGH (ref 19–32)
Chloride: 102 mEq/L (ref 96–112)
Creatinine, Ser: 1.33 mg/dL — ABNORMAL HIGH (ref 0.50–1.10)
GFR calc Af Amer: 53 mL/min — ABNORMAL LOW (ref >90)
GFR calc non Af Amer: 46 mL/min — ABNORMAL LOW (ref >90)
GLUCOSE: 103 mg/dL — AB (ref 70–99)
Potassium: 4.2 mEq/L (ref 3.7–5.3)
Sodium: 145 mEq/L (ref 137–147)

## 2013-08-31 LAB — MRSA PCR SCREENING: MRSA BY PCR: POSITIVE — AB

## 2013-08-31 LAB — GLUCOSE, CAPILLARY: Glucose-Capillary: 129 mg/dL — ABNORMAL HIGH (ref 70–99)

## 2013-08-31 LAB — TROPONIN I
Troponin I: <0.3 ng/mL (ref <0.30)
Troponin I: <0.3 ng/mL (ref <0.30)
Troponin I: <0.3 ng/mL (ref <0.30)

## 2013-08-31 MED ORDER — CLONAZEPAM 1 MG PO TABS
1.0000 mg | ORAL_TABLET | Freq: Two times a day (BID) | ORAL | Status: DC
  Administered 2013-08-31 – 2013-09-02 (×6): 1 mg via ORAL
  Filled 2013-08-31 (×6): qty 1

## 2013-08-31 MED ORDER — SODIUM CHLORIDE 0.9 % IJ SOLN
3.0000 mL | INTRAMUSCULAR | Status: DC | PRN
  Administered 2013-08-31 (×2): 3 mL via INTRAVENOUS

## 2013-08-31 MED ORDER — PANTOPRAZOLE SODIUM 40 MG PO TBEC
40.0000 mg | DELAYED_RELEASE_TABLET | Freq: Every morning | ORAL | Status: DC
  Administered 2013-08-31 – 2013-09-02 (×3): 40 mg via ORAL
  Filled 2013-08-31 (×3): qty 1

## 2013-08-31 MED ORDER — NAPROXEN 500 MG PO TABS
500.0000 mg | ORAL_TABLET | Freq: Two times a day (BID) | ORAL | Status: DC
  Administered 2013-08-31 – 2013-09-02 (×5): 500 mg via ORAL
  Filled 2013-08-31 (×7): qty 1

## 2013-08-31 MED ORDER — CLONIDINE HCL 0.2 MG PO TABS
0.2000 mg | ORAL_TABLET | Freq: Two times a day (BID) | ORAL | Status: DC
  Administered 2013-08-31 (×2): 0.2 mg via ORAL
  Filled 2013-08-31 (×3): qty 1

## 2013-08-31 MED ORDER — DARIFENACIN HYDROBROMIDE ER 7.5 MG PO TB24
7.5000 mg | ORAL_TABLET | Freq: Every day | ORAL | Status: DC
  Administered 2013-08-31 – 2013-09-02 (×3): 7.5 mg via ORAL
  Filled 2013-08-31 (×4): qty 1

## 2013-08-31 MED ORDER — HYDROXYZINE HCL 25 MG PO TABS
25.0000 mg | ORAL_TABLET | Freq: Four times a day (QID) | ORAL | Status: DC
  Administered 2013-08-31 – 2013-09-02 (×10): 25 mg via ORAL
  Filled 2013-08-31 (×14): qty 1

## 2013-08-31 MED ORDER — HYDROCODONE-ACETAMINOPHEN 10-325 MG PO TABS
1.0000 | ORAL_TABLET | Freq: Four times a day (QID) | ORAL | Status: DC | PRN
  Administered 2013-08-31 – 2013-09-01 (×6): 1 via ORAL
  Filled 2013-08-31 (×7): qty 1

## 2013-08-31 MED ORDER — PERFLUTREN LIPID MICROSPHERE
1.0000 mL | INTRAVENOUS | Status: AC | PRN
  Administered 2013-08-31: 2 mL via INTRAVENOUS
  Filled 2013-08-31: qty 10

## 2013-08-31 MED ORDER — DIPHENHYDRAMINE HCL 25 MG PO CAPS
25.0000 mg | ORAL_CAPSULE | Freq: Four times a day (QID) | ORAL | Status: DC | PRN
  Administered 2013-08-31: 25 mg via ORAL
  Filled 2013-08-31: qty 1

## 2013-08-31 MED ORDER — CHLORHEXIDINE GLUCONATE CLOTH 2 % EX PADS
6.0000 | MEDICATED_PAD | Freq: Every day | CUTANEOUS | Status: DC
  Administered 2013-09-01 – 2013-09-02 (×2): 6 via TOPICAL

## 2013-08-31 MED ORDER — SODIUM CHLORIDE 0.9 % IJ SOLN
3.0000 mL | Freq: Two times a day (BID) | INTRAMUSCULAR | Status: DC
  Administered 2013-08-31 – 2013-09-02 (×4): 3 mL via INTRAVENOUS

## 2013-08-31 MED ORDER — PROMETHAZINE HCL 25 MG PO TABS: 25.0000 mg | ORAL_TABLET | Freq: Four times a day (QID) | ORAL | Status: DC | PRN

## 2013-08-31 MED ORDER — VITAMIN E 45 MG (100 UNIT) PO CAPS
100.0000 [IU] | ORAL_CAPSULE | Freq: Every morning | ORAL | Status: DC
  Administered 2013-08-31 – 2013-09-02 (×3): 100 [IU] via ORAL
  Filled 2013-08-31 (×3): qty 1

## 2013-08-31 MED ORDER — ACETAMINOPHEN 325 MG PO TABS: 650.0000 mg | ORAL_TABLET | Freq: Four times a day (QID) | ORAL | Status: DC | PRN

## 2013-08-31 MED ORDER — ALBUTEROL SULFATE (2.5 MG/3ML) 0.083% IN NEBU
2.5000 mg | INHALATION_SOLUTION | Freq: Four times a day (QID) | RESPIRATORY_TRACT | Status: DC | PRN
  Administered 2013-09-01 (×2): 2.5 mg via RESPIRATORY_TRACT
  Filled 2013-08-31 (×2): qty 3

## 2013-08-31 MED ORDER — FUROSEMIDE 10 MG/ML IJ SOLN
40.0000 mg | Freq: Two times a day (BID) | INTRAMUSCULAR | Status: DC
  Administered 2013-08-31: 40 mg via INTRAVENOUS
  Filled 2013-08-31: qty 4

## 2013-08-31 MED ORDER — ATORVASTATIN CALCIUM 20 MG PO TABS
20.0000 mg | ORAL_TABLET | Freq: Every day | ORAL | Status: DC
  Administered 2013-08-31 – 2013-09-01 (×2): 20 mg via ORAL
  Filled 2013-08-31 (×3): qty 1

## 2013-08-31 MED ORDER — VITAMIN B-12 1000 MCG PO TABS
1000.0000 ug | ORAL_TABLET | Freq: Every day | ORAL | Status: DC
  Administered 2013-08-31 – 2013-09-02 (×3): 1000 ug via ORAL
  Filled 2013-08-31 (×3): qty 1

## 2013-08-31 MED ORDER — FUROSEMIDE 10 MG/ML IJ SOLN
40.0000 mg | Freq: Three times a day (TID) | INTRAMUSCULAR | Status: DC
  Administered 2013-08-31 – 2013-09-01 (×4): 40 mg via INTRAVENOUS
  Filled 2013-08-31 (×6): qty 4

## 2013-08-31 MED ORDER — AMITRIPTYLINE HCL 75 MG PO TABS
75.0000 mg | ORAL_TABLET | Freq: Every day | ORAL | Status: DC
  Administered 2013-08-31 – 2013-09-01 (×2): 75 mg via ORAL
  Filled 2013-08-31 (×3): qty 1

## 2013-08-31 MED ORDER — MUPIROCIN 2 % EX OINT
1.0000 "application " | TOPICAL_OINTMENT | Freq: Two times a day (BID) | CUTANEOUS | Status: DC
  Administered 2013-08-31 – 2013-09-02 (×4): 1 via NASAL
  Filled 2013-08-31: qty 22

## 2013-08-31 MED ORDER — LISINOPRIL 40 MG PO TABS
40.0000 mg | ORAL_TABLET | Freq: Every day | ORAL | Status: DC
  Administered 2013-08-31 – 2013-09-02 (×2): 40 mg via ORAL
  Filled 2013-08-31 (×3): qty 1

## 2013-08-31 MED ORDER — KETOCONAZOLE 200 MG PO TABS
200.0000 mg | ORAL_TABLET | Freq: Every day | ORAL | Status: DC
  Administered 2013-08-31 – 2013-09-02 (×3): 200 mg via ORAL
  Filled 2013-08-31 (×3): qty 1

## 2013-08-31 MED ORDER — SODIUM CHLORIDE 0.9 % IV SOLN: 250.0000 mL | INTRAVENOUS | Status: DC | PRN

## 2013-08-31 MED ORDER — KETOCONAZOLE 2 % EX SHAM
1.0000 "application " | MEDICATED_SHAMPOO | CUTANEOUS | Status: DC
  Administered 2013-09-01: 1 via TOPICAL
  Filled 2013-08-31: qty 120

## 2013-08-31 MED ORDER — ALBUTEROL SULFATE (2.5 MG/3ML) 0.083% IN NEBU: 3.0000 mL | INHALATION_SOLUTION | RESPIRATORY_TRACT | Status: DC | PRN

## 2013-08-31 MED ORDER — OMEGA-3-ACID ETHYL ESTERS 1 G PO CAPS
1.0000 g | ORAL_CAPSULE | Freq: Two times a day (BID) | ORAL | Status: DC
  Administered 2013-08-31 – 2013-09-02 (×6): 1 g via ORAL
  Filled 2013-08-31 (×7): qty 1

## 2013-08-31 MED ORDER — ENOXAPARIN SODIUM 40 MG/0.4ML ~~LOC~~ SOLN
40.0000 mg | SUBCUTANEOUS | Status: DC
  Filled 2013-08-31 (×4): qty 0.4

## 2013-08-31 MED ORDER — ASPIRIN 325 MG PO TABS
325.0000 mg | ORAL_TABLET | Freq: Every evening | ORAL | Status: DC
  Administered 2013-08-31 – 2013-09-01 (×2): 325 mg via ORAL
  Filled 2013-08-31 (×3): qty 1

## 2013-08-31 MED ORDER — ALLOPURINOL 100 MG PO TABS
100.0000 mg | ORAL_TABLET | Freq: Every morning | ORAL | Status: DC
  Administered 2013-08-31 – 2013-09-02 (×3): 100 mg via ORAL
  Filled 2013-08-31 (×3): qty 1

## 2013-08-31 NOTE — ED Notes (Signed)
Attempted to call report, RN unavailable.

## 2013-08-31 NOTE — Progress Notes (Addendum)
[facility] Progress Note   Kara Vincent [id number] DOB: [birth year] DOA: 08/30/2013 PCP: Harvie Junior, MD  Brief narrative: Kara Vincent is a 48 y.o. female presenting on [id number] with chf diastolic, htn, morbid obesity, osa comes in with several days of worsening sob and a rash on her legs. Her lasix was stopped over a week ago for worsening renal function however, her legs began to swell so she restarted it at 40 mg daily. She presents for dyspnea.     Subjective: Breathing somewhat better now. No cough.   Assessment/Plan: Principal Problem:   Acute diastolic heart failure - cont to diurese- still has crackles at bases  Active Problems:   HYPERTENSION - BP actually low - hold Clonidine and follow BP for rebound HTN    Morbid obesity - probably has poor eating habits- asking for heart healthy diet to be liberalized    CKD (chronic kidney disease), stage III - stable - follow with diuresis  Asthma - stable currently  OSA - start CPAP  Code Status: full code Family Communication: none Disposition Plan: home  Consultants: none  Procedures: none  Antibiotics: Antibiotics Given (last 72 hours)   None       DVT prophylaxis: Lovenox  Objective: Filed Weights   08/31/13 0159 08/31/13 0418  Weight: 193.686 kg (427 lb) 192.779 kg (425 lb)    Vitals Filed Vitals:   08/31/13 0418 08/31/13 0943 08/31/13 1351 08/31/13 1420  BP:  118/77 84/35 107/48  Pulse:   57 61  Temp:   97.4 F (36.3 C)   TempSrc:   Oral   Resp:   16   Height:      Weight: 192.779 kg (425 lb)     SpO2:   97%       Intake/Output Summary (Last 24 hours) at 08/31/13 1601 Last data filed at 08/31/13 0900  Gross per 24 hour  Intake    480 ml  Output      0 ml  Net    480 ml     Exam: General: No acute respiratory distress- morbidly obese Lungs:  Crackles  R> L Cardiovascular: Regular rate and rhythm without murmur gallop or rub normal S1 and S2 Abdomen:  Nontender, nondistended, soft, bowel sounds positive, no rebound, no ascites, no appreciable mass Extremities: No significant cyanosis, clubbing, or edema bilateral lower extremities  Data Reviewed: Basic Metabolic Panel:  Recent Labs Lab 08/30/13 2206 08/31/13 0327  NA 145 145  K 4.2 4.2  CL 99 102  CO2 38* 37*  GLUCOSE 93 103*  BUN 13 18  CREATININE 1.24* 1.33*  CALCIUM 8.8 8.6   Liver Function Tests: No results found for this basename: AST, ALT, ALKPHOS, BILITOT, PROT, ALBUMIN,  in the last 168 hours No results found for this basename: LIPASE, AMYLASE,  in the last 168 hours No results found for this basename: AMMONIA,  in the last 168 hours CBC:  Recent Labs Lab 08/30/13 2206  WBC 10.4  NEUTROABS 7.8*  HGB 12.2  HCT 40.5  MCV 95.7  PLT 218   Cardiac Enzymes:  Recent Labs Lab 08/30/13 2206 08/31/13 0230 08/31/13 0836 08/31/13 1456  TROPONINI <0.30 <0.30 <0.30 <0.30   BNP (last 3 results)  Recent Labs  06/02/13 2310 06/12/13 0001 08/30/13 2206  PROBNP 97.7 157.9* 584.4*   CBG:  Recent Labs Lab 08/31/13 0815  GLUCAP 129*    Recent Results (from the past 240 hour(s))  MRSA PCR SCREENING  Status: Abnormal   Collection Time    08/31/13  8:00 AM      Result Value Ref Range Status   MRSA by PCR POSITIVE (*) NEGATIVE Final   Comment:            The GeneXpert MRSA Assay (FDA     approved for NASAL specimens     only), is one component of a     comprehensive MRSA colonization     surveillance program. It is not     intended to diagnose MRSA     infection nor to guide or     monitor treatment for     MRSA infections.     RESULT CALLED TO, READ BACK BY AND VERIFIED WITH:     ALI,E @ 0945 ON [id number] BY POTEAT,S     Studies:  Recent x-ray studies have been reviewed in detail by the Attending Physician  Scheduled Meds:  Scheduled Meds: . allopurinol  100 mg Oral q morning - 10a  . amitriptyline  75 mg Oral QHS  . aspirin  325 mg Oral  QPM  . atorvastatin  20 mg Oral q1800  . [START ON 09/01/2013] Chlorhexidine Gluconate Cloth  6 each Topical Q0600  . clonazePAM  1 mg Oral BID  . cloNIDine  0.2 mg Oral BID  . furosemide  40 mg Intravenous Once  . furosemide  40 mg Intravenous 3 times per day  . hydrOXYzine  25 mg Oral Q6H  . [START ON 09/01/2013] ketoconazole  1 application Topical Once per day on Mon Thu  . ketoconazole  200 mg Oral Daily  . lisinopril  40 mg Oral Daily  . mupirocin ointment  1 application Nasal BID  . naproxen  500 mg Oral BID WC  . omega-3 acid ethyl esters  1 g Oral BID  . pantoprazole  40 mg Oral q morning - 10a  . sodium chloride  3 mL Intravenous Q12H  . vitamin B-12  1,000 mcg Oral Daily  . vitamin E  100 Units Oral q morning - 10a   Continuous Infusions:   Time spent on care of this patient: 35 min   Alta Sierra, MD 08/31/2013, 4:01 PM  LOS: 1 day   [facility] Office  [phone] Pager - Text Page per Shea Evans   If 7PM-7AM, please contact night-coverage [email or web address]

## 2013-08-31 NOTE — Progress Notes (Signed)
Spoke with pt regarding cpap.  Pt said she wasn't ready for cpap at this time.  RN at bedside stated she has received lasix and will be up to the bedside commode frequently.  Pt was advised to let RN know when she is ready for bed/cpap.  RN aware.

## 2013-08-31 NOTE — Progress Notes (Signed)
CRITICAL VALUE ALERT  Critical value received: positive MRSA  Date of notification:  08/31/13  Time of notification:  0945  Critical value read back:yes  Nurse who received alert:  Arman Filter  MD notified (1st page):  Dr. Wynelle Cleveland  Time of first page:  1030AM  MD notified (2nd page):  Time of second page:  Responding MD:  Dr. Wynelle Cleveland  Time MD responded:  [id number]

## 2013-08-31 NOTE — Progress Notes (Signed)
UR completed. Patient changed to inpatient- requiring IV lasix BID  

## 2013-08-31 NOTE — Progress Notes (Signed)
Echocardiogram 2D Echocardiogram with Definity has been performed.  Kara Vincent 08/31/2013, 1:34 PM

## 2013-09-01 ENCOUNTER — Inpatient Hospital Stay (HOSPITAL_COMMUNITY): Payer: Medicaid Other

## 2013-09-01 DIAGNOSIS — J45909 Unspecified asthma, uncomplicated: Secondary | ICD-10-CM

## 2013-09-01 DIAGNOSIS — N179 Acute kidney failure, unspecified: Secondary | ICD-10-CM

## 2013-09-01 LAB — BASIC METABOLIC PANEL
BUN: 29 mg/dL — ABNORMAL HIGH (ref 6–23)
CHLORIDE: 96 meq/L (ref 96–112)
CO2: 36 mEq/L — ABNORMAL HIGH (ref 19–32)
Calcium: 8.6 mg/dL (ref 8.4–10.5)
Creatinine, Ser: 1.88 mg/dL — ABNORMAL HIGH (ref 0.50–1.10)
GFR calc Af Amer: 35 mL/min — ABNORMAL LOW (ref >90)
GFR calc non Af Amer: 30 mL/min — ABNORMAL LOW (ref >90)
Glucose, Bld: 95 mg/dL (ref 70–99)
POTASSIUM: 5.1 meq/L (ref 3.7–5.3)
SODIUM: 142 meq/L (ref 137–147)

## 2013-09-01 MED ORDER — FUROSEMIDE 40 MG PO TABS
40.0000 mg | ORAL_TABLET | Freq: Two times a day (BID) | ORAL | Status: DC
  Administered 2013-09-01: 40 mg via ORAL
  Filled 2013-09-01 (×4): qty 1

## 2013-09-01 MED ORDER — ALBUTEROL SULFATE (2.5 MG/3ML) 0.083% IN NEBU
2.5000 mg | INHALATION_SOLUTION | Freq: Four times a day (QID) | RESPIRATORY_TRACT | Status: DC
  Administered 2013-09-01 – 2013-09-02 (×2): 2.5 mg via RESPIRATORY_TRACT
  Filled 2013-09-01 (×4): qty 3

## 2013-09-01 NOTE — Progress Notes (Signed)
CARE MANAGEMENT NOTE 09/01/2013  Patient:  Kara Vincent, Kara Vincent   Account Number:  [id number]  Date Initiated:  09/01/2013  Documentation initiated by:  Gabriel Earing  Subjective/Objective Assessment:   Pt admitted with CHF     Action/Plan:   from home   Anticipated DC Date:  09/03/2013   Anticipated DC Plan:  HOME/SELF CARE         Choice offered to / List presented to:             Status of service:  In process, will continue to follow Medicare Important Message given?   (If response is "NO", the following Medicare IM given date fields will be blank) Date Medicare IM given:   Date Additional Medicare IM given:    Discharge Disposition:    Per UR Regulation:  Reviewed for med. necessity/level of care/duration of stay  If discussed at [place] of Stay Meetings, dates discussed:    Comments:  09/01/13 MMcGibboney, RN, BSN Spoke with pt concerning [place]. Pt appears to be sleep while talking, words are not clear. Will continue to follow.

## 2013-09-01 NOTE — Progress Notes (Signed)
Followed up with RN regarding cpap.  Per RN, pt stated she was ok and did not want cpap.  Pt remains on 3lnc at this time and tolerating well.  This RT advised RN to let me know if pt changes her mind.

## 2013-09-01 NOTE — Clinical Documentation Improvement (Signed)
Possible Clinical Conditions? Acute Respiratory Failure Acute on Chronic Respiratory Failure Chronic Respiratory Failure Other Condition Cannot Clinically Determine   Supporting Information: Risk Factors:( As per notes) " Morbid Obesity Signs & Symptoms:(As per notes) "Acute diastolic heart failure - cont to diurese- still has crackles at bases",  " reports SOB, normally uses home O2" ," found to be hypoxic",   Diagnostics:( As per ED notes) SpO2 94%   Treatment: O2 @ 3l/m via n/c  albuterol (PROVENTIL) (2.5 MG/3ML) 0.083% nebulizer solution 2.5 mg   [id number]    Ordered Dose: 2.5 mg Route: Nebulization Frequency: Every 6 hours PRN for wheezing   albuterol (PROVENTIL HFA) 108 (90 BASE) MCG/ACT inhaler [id number]   Order Details    Dose: 2 puff Route: Inhalation Frequency: Every 4 hours PRN for wheezing, shortness of breath     Thank You, Alessandra Grout, RN, BSN, CCDS, Clinical Documentation Specialist:  [phone]   [phone] [place]

## 2013-09-01 NOTE — Progress Notes (Signed)
Placed patient on CPAP for the night at 10cmH20 and oxygen set at 3lpm. Will continue to monitor patient

## 2013-09-01 NOTE — Progress Notes (Addendum)
[facility] Progress Note   Kara Vincent [id number] DOB: 1965-12-20 DOA: 08/30/2013 PCP: Harvie Junior, MD  Brief narrative: Kara Vincent is a 48 y.o. female presenting on [id number] with chf diastolic, htn, morbid obesity, osa comes in with several days of worsening sob and a rash on her legs. Her lasix was stopped over a week ago for worsening renal function however, her legs began to swell so she restarted it at 40 mg daily. She presents for dyspnea.   Subjective: Wheezing this AM. Mild cough.   Assessment/Plan: Principal Problem:   Acute diastolic heart failure - cont to diurese but change to oral lasix  Active Problems:   HYPERTENSION - BP actually low - hold Clonidine and follow BP for rebound HTN    Morbid obesity - probably has poor eating habits- asking for heart healthy diet to be liberalized    CKD (chronic kidney disease), stage III - stable - follow with diuresis  Asthma - wheezing today but O2 requirements unchanged - Nebs PRN  OSA - start CPAP  Code Status: full code Family Communication: none Disposition Plan: home  Consultants: none  Procedures: none  Antibiotics: Antibiotics Given (last 72 hours)   None       DVT prophylaxis: Lovenox  Objective: Filed Weights   08/31/13 0159 08/31/13 0418 09/01/13 0531  Weight: 193.686 kg (427 lb) 192.779 kg (425 lb) 194.8 kg (429 lb 7.3 oz)    Vitals Filed Vitals:   09/01/13 0936 09/01/13 0941 09/01/13 1355 09/01/13 1459  BP: 104/43  106/52   Pulse:   79   Temp:   97.9 F (36.6 C)   TempSrc:   Oral   Resp:      Height:      Weight:      SpO2:  94% 95% 97%      Intake/Output Summary (Last 24 hours) at 09/01/13 1839 Last data filed at 09/01/13 1544  Gross per 24 hour  Intake      0 ml  Output   3000 ml  Net  -3000 ml     Exam: General: No acute respiratory distress- morbidly obese Lungs:  Crackles  R> L, wheezing b// Cardiovascular: Regular rate and rhythm  without murmur gallop or rub normal S1 and S2 Abdomen: Nontender, nondistended, soft, bowel sounds positive, no rebound, no ascites, no appreciable mass Extremities: No significant cyanosis, clubbing, or edema bilateral lower extremities  Data Reviewed: Basic Metabolic Panel:  Recent Labs Lab 08/30/13 2206 08/31/13 0327 09/01/13 0346  NA 145 145 142  K 4.2 4.2 5.1  CL 99 102 96  CO2 38* 37* 36*  GLUCOSE 93 103* 95  BUN 13 18 29*  CREATININE 1.24* 1.33* 1.88*  CALCIUM 8.8 8.6 8.6   Liver Function Tests: No results found for this basename: AST, ALT, ALKPHOS, BILITOT, PROT, ALBUMIN,  in the last 168 hours No results found for this basename: LIPASE, AMYLASE,  in the last 168 hours No results found for this basename: AMMONIA,  in the last 168 hours CBC:  Recent Labs Lab 08/30/13 2206  WBC 10.4  NEUTROABS 7.8*  HGB 12.2  HCT 40.5  MCV 95.7  PLT 218   Cardiac Enzymes:  Recent Labs Lab 08/30/13 2206 08/31/13 0230 08/31/13 0836 08/31/13 1456  TROPONINI <0.30 <0.30 <0.30 <0.30   BNP (last 3 results)  Recent Labs  06/02/13 2310 06/12/13 0001 08/30/13 2206  PROBNP 97.7 157.9* 584.4*   CBG:  Recent Labs Lab 08/31/13 0815  GLUCAP  129*    Recent Results (from the past 240 hour(s))  MRSA PCR SCREENING     Status: Abnormal   Collection Time    08/31/13  8:00 AM      Result Value Ref Range Status   MRSA by PCR POSITIVE (*) NEGATIVE Final   Comment:            The GeneXpert MRSA Assay (FDA     approved for NASAL specimens     only), is one component of a     comprehensive MRSA colonization     surveillance program. It is not     intended to diagnose MRSA     infection nor to guide or     monitor treatment for     MRSA infections.     RESULT CALLED TO, READ BACK BY AND VERIFIED WITH:     ALI,E @ 0945 ON [id number] BY POTEAT,S     Studies:  Recent x-ray studies have been reviewed in detail by the Attending Physician  Scheduled Meds:  Scheduled Meds: .  albuterol  2.5 mg Nebulization Q6H  . allopurinol  100 mg Oral q morning - 10a  . amitriptyline  75 mg Oral QHS  . aspirin  325 mg Oral QPM  . atorvastatin  20 mg Oral q1800  . Chlorhexidine Gluconate Cloth  6 each Topical Q0600  . clonazePAM  1 mg Oral BID  . darifenacin  7.5 mg Oral Daily  . enoxaparin (LOVENOX) injection  40 mg Subcutaneous Q24H  . furosemide  40 mg Intravenous Once  . hydrOXYzine  25 mg Oral Q6H  . ketoconazole  1 application Topical Once per day on Mon Thu  . ketoconazole  200 mg Oral Daily  . lisinopril  40 mg Oral Daily  . mupirocin ointment  1 application Nasal BID  . naproxen  500 mg Oral BID WC  . omega-3 acid ethyl esters  1 g Oral BID  . pantoprazole  40 mg Oral q morning - 10a  . sodium chloride  3 mL Intravenous Q12H  . vitamin B-12  1,000 mcg Oral Daily  . vitamin E  100 Units Oral q morning - 10a   Continuous Infusions:   Time spent on care of this patient: 35 min   Newborn, MD 09/01/2013, 6:39 PM  LOS: 2 days   [facility] Office  [phone] Pager - Text Page per Shea Evans   If 7PM-7AM, please contact night-coverage [email or web address]

## 2013-09-02 DIAGNOSIS — J9600 Acute respiratory failure, unspecified whether with hypoxia or hypercapnia: Secondary | ICD-10-CM

## 2013-09-02 LAB — BASIC METABOLIC PANEL
BUN: 35 mg/dL — ABNORMAL HIGH (ref 6–23)
CHLORIDE: 95 meq/L — AB (ref 96–112)
CO2: 36 meq/L — AB (ref 19–32)
Calcium: 8.5 mg/dL (ref 8.4–10.5)
Creatinine, Ser: 1.9 mg/dL — ABNORMAL HIGH (ref 0.50–1.10)
GFR calc Af Amer: 35 mL/min — ABNORMAL LOW (ref >90)
GFR calc non Af Amer: 30 mL/min — ABNORMAL LOW (ref >90)
Glucose, Bld: 100 mg/dL — ABNORMAL HIGH (ref 70–99)
Potassium: 5 mEq/L (ref 3.7–5.3)
Sodium: 141 mEq/L (ref 137–147)

## 2013-09-02 MED ORDER — FUROSEMIDE 40 MG PO TABS: 40.0000 mg | ORAL_TABLET | Freq: Two times a day (BID) | ORAL | Status: DC

## 2013-09-02 NOTE — Care Management Note (Unsigned)
    Page 1 of 1   09/02/2013     12:56:51 PM CARE MANAGEMENT NOTE 09/02/2013  Patient:  MIGUELINA, FORE   Account Number:  [id number]  Date Initiated:  09/01/2013  Documentation initiated by:  Gabriel Earing  Subjective/Objective Assessment:   Pt admitted with CHF     Action/Plan:   from home. Needs HHRN for CHF.   Anticipated DC Date:  09/02/2013   Anticipated DC Plan:  [place]         Choice offered to / List presented to:  C-1 Patient        [place] arranged  HH-1 RN      [place].   Status of service:  In process, will continue to follow Medicare Important Message given?   (If response is "NO", the following Medicare IM given date fields will be blank) Date Medicare IM given:   Date Additional Medicare IM given:    Discharge Disposition:    Per UR Regulation:  Reviewed for med. necessity/level of care/duration of stay  If discussed at [place] of Stay Meetings, dates discussed:    Comments:  6/12 [place] RN BSN [phone] Met with pt at bedside to discuss [facility] services as ordered by MD. She stated she has used [facility] in the past and wishes to use them again. Referral made to [facility]  09/01/13 MMcGibboney, RN, BSN Spoke with pt concerning Home Health Needs. Pt appears to be sleep while talking, words are not clear. Will continue to follow.

## 2013-09-02 NOTE — Progress Notes (Signed)
Patient discharged home via public transportation arranged by CSW. Discharge instructions given and explained to patient and she verbalized understanding, denies any distress. No wound noted skin but some rashes that patient was admitted with.

## 2013-09-02 NOTE — Discharge Summary (Signed)
Physician Discharge Summary  Kara Vincent [id number] DOB: [birth year] DOA: 08/30/2013  PCP: Harvie Junior, MD  Admit date: 08/30/2013 Discharge date: 09/02/2013  Time spent: >45 minutes  Recommendations for Outpatient Follow-up:  1. HHRN for CHF  Discharge Diagnoses:  Principal Problem:   Acute respiratory failure/  Acute diastolic heart failure Active Problems:   OBSTRUCTIVE SLEEP APNEA   HYPERTENSION   Morbid obesity   SOB (shortness of breath)   CKD (chronic kidney disease), stage III  Discharge Condition: stable  Diet recommendation: heart healty, low sodium  Filed Weights   08/31/13 0418 09/01/13 0531 09/02/13 0500  Weight: 192.779 kg (425 lb) 194.8 kg (429 lb 7.3 oz) 193.913 kg (427 lb 8 oz)    History of present illness:  Kara Vincent is a 48 y.o. female presenting on A999333 with chf diastolic, htn, morbid obesity, osa comes in with several days of worsening sob and a rash on her legs. Her lasix was stopped over a week ago for worsening renal function however, her legs began to swell so she restarted it at 40 mg daily. She presents for dyspnea.    Hospital Course:  Principal Problem:  Acute diastolic heart failure  - has been diuresed aggressively with a noted bump in BUN/ Cr (see below)  - weight in computer is inaccurate as pt is weighed on the bed one day and on a bedside scale the next day - I and O reveal negative balance by 5700 cc - CXR reveals improvement in CHF - chronically on 3 L O2- pulse ox has been > 95% on this  Active Problems:  HYPERTENSION  - BP actually low  - hold Clonidine - no rebound HTN noted- will d/c Clonidine completely  Morbid obesity  - probably has poor eating habits- asking for heart healthy diet to be liberalized   CKD (chronic kidney disease), stage III  - mild bump in BUN/ Cr will diuresis - see labs below  Asthma  - stable - Nebs PRN  OSA - cont CPAP at  home   Procedures:  none  Consultations:  none  Discharge Exam: Filed Vitals:   09/02/13 0130 09/02/13 0500 09/02/13 0730 09/02/13 0901  BP:   115/55 110/70  Pulse:   68   Temp:   97.4 F (36.3 C)   TempSrc:   Oral   Resp:   18   Height:      Weight:  193.913 kg (427 lb 8 oz)    SpO2: 92%  97%       General: No acute respiratory distress- morbidly obese Cardiovascular: RRR, no murmurs- no pedal edema Respiratory: Mild wheeze, no crackles, good air entry  Discharge Instructions You were cared for by a hospitalist during your hospital stay. If you have any questions about your discharge medications or the care you received while you were in the hospital after you are discharged, you can call the unit and asked to speak with the hospitalist on call if the hospitalist that took care of you is not available. Once you are discharged, your primary care physician will handle any further medical issues. Please note that NO REFILLS for any discharge medications will be authorized once you are discharged, as it is imperative that you return to your primary care physician (or establish a relationship with a primary care physician if you do not have one) for your aftercare needs so that they can reassess your need for medications and monitor your lab values.  Discharge Instructions   Diet - low sodium heart healthy    Complete by:  As directed      Increase activity slowly    Complete by:  As directed             Medication List    STOP taking these medications       cloNIDine 0.2 MG tablet  Commonly known as:  CATAPRES      TAKE these medications       acetaminophen 325 MG tablet  Commonly known as:  TYLENOL  Take 650 mg by mouth every 6 (six) hours as needed for mild pain or headache.     allopurinol 100 MG tablet  Commonly known as:  ZYLOPRIM  Take 100 mg by mouth every morning.     amitriptyline 75 MG tablet  Commonly known as:  ELAVIL  Take 1 tablet (75 mg  total) by mouth at bedtime.     ammonium lactate 12 % cream  Commonly known as:  AMLACTIN  Apply 1 g topically daily as needed for dry skin. Applies to back and legs.Marland KitchenMarland KitchenIn a thin layer on affected area for dry patches.     aspirin 325 MG tablet  Take 325 mg by mouth every evening.     atorvastatin 20 MG tablet  Commonly known as:  LIPITOR  Take 20 mg by mouth every morning.     clonazePAM 1 MG tablet  Commonly known as:  KLONOPIN  Take 1 mg by mouth 2 (two) times daily.     diphenhydrAMINE 25 mg capsule  Commonly known as:  BENADRYL  Take 25 mg by mouth every 6 (six) hours as needed for itching or allergies. For itching     eucerin lotion  Apply 5 mLs topically 2 (two) times daily as needed for dry skin. On affected areas for dry patches.     furosemide 40 MG tablet  Commonly known as:  LASIX  Take 1 tablet (40 mg total) by mouth 2 (two) times daily.     HYDROcodone-acetaminophen 10-325 MG per tablet  Commonly known as:  NORCO  Take 1 tablet by mouth every 6 (six) hours as needed for pain.     hydrOXYzine 25 MG tablet  Commonly known as:  ATARAX/VISTARIL  Take 1 tablet (25 mg total) by mouth every 6 (six) hours.     ketoconazole 2 % shampoo  Commonly known as:  NIZORAL  Apply 1 application topically 2 (two) times a week.     ketoconazole 200 MG tablet  Commonly known as:  NIZORAL  Take 200 mg by mouth daily.     lisinopril 40 MG tablet  Commonly known as:  PRINIVIL,ZESTRIL  Take 40 mg by mouth daily.     naproxen 500 MG tablet  Commonly known as:  NAPROSYN  Take 500 mg by mouth 2 (two) times daily with a meal.     omega-3 acid ethyl esters 1 G capsule  Commonly known as:  LOVAZA  Take 1 g by mouth 2 (two) times daily.     pantoprazole 40 MG tablet  Commonly known as:  PROTONIX  Take 40 mg by mouth every morning.     PRESCRIPTION MEDICATION  Place 1 drop into both ears 3 (three) times daily.     promethazine 25 MG tablet  Commonly known as:  PHENERGAN   Take 25 mg by mouth every 6 (six) hours as needed for nausea. For nausea/vomiting     PROVENTIL HFA 108 (  90 BASE) MCG/ACT inhaler  Generic drug:  albuterol  Inhale 2 puffs into the lungs every 4 (four) hours as needed for wheezing or shortness of breath. For asthma relief     albuterol (2.5 MG/3ML) 0.083% nebulizer solution  Commonly known as:  PROVENTIL  Take 3 mLs (2.5 mg total) by nebulization every 6 (six) hours as needed for wheezing.     solifenacin 5 MG tablet  Commonly known as:  VESICARE  Take 5 mg by mouth every morning.     vitamin B-12 1000 MCG tablet  Commonly known as:  CYANOCOBALAMIN  Take 1,000 mcg by mouth daily.     vitamin E 100 UNIT capsule  Take 100 Units by mouth every morning.       Allergies  Allergen Reactions  . Ibuprofen Hives, Shortness Of Breath and Palpitations  . Adhesive [Tape] Rash  . Azithromycin Hives and Rash  . Doxycycline Rash  . Penicillins Rash  . Sulfa Antibiotics Rash  . Sulfamethoxazole-Trimethoprim Rash   Follow-up Information   Follow up with Harvie Junior, MD. Schedule an appointment as soon as possible for a visit in 2 weeks.   Specialty:  Specialist   Contact information:   [place] [address] [phone]        The results of significant diagnostics from this hospitalization (including imaging, microbiology, ancillary and laboratory) are listed below for reference.    Significant Diagnostic Studies: Dg Chest 2 View  08/30/2013   CLINICAL DATA:  Shortness of breath with upper abdominal pain.  EXAM: CHEST  2 VIEW  COMPARISON:  06/12/2013.  FINDINGS: Cardiomegaly. Moderate vascular congestion. Early pulmonary edema not excluded. No effusion or pneumothorax. Negative osseous structures. Worsening aeration.  IMPRESSION: Cardiomegaly. Moderate vascular congestion with possible early pulmonary edema.   Electronically Signed   By: Rolla Flatten M.D.   On: 08/30/2013 22:11   Dg Chest Port 1  View  09/01/2013   CLINICAL DATA:  CHF.  Asthma.  EXAM: PORTABLE CHEST - 1 VIEW  COMPARISON:  08/30/2013 .  FINDINGS: Cardiomegaly with pulmonary vascular prominence and interstitial prominence noted consistent with congestive heart failure mild interstitial edema. No focal alveolar infiltrate noted. No pleural effusion or pneumothorax.  IMPRESSION: Mild congestive heart failure .   Electronically Signed   By: Marcello Moores  Register   On: 09/01/2013 13:31    Microbiology: Recent Results (from the past 240 hour(s))  MRSA PCR SCREENING     Status: Abnormal   Collection Time    08/31/13  8:00 AM      Result Value Ref Range Status   MRSA by PCR POSITIVE (*) NEGATIVE Final   Comment:            The GeneXpert MRSA Assay (FDA     approved for NASAL specimens     only), is one component of a     comprehensive MRSA colonization     surveillance program. It is not     intended to diagnose MRSA     infection nor to guide or     monitor treatment for     MRSA infections.     RESULT CALLED TO, READ BACK BY AND VERIFIED WITH:     ALI,E @ 0945 ON [id number] BY POTEAT,S     Labs: Basic Metabolic Panel:  Recent Labs Lab 08/30/13 2206 08/31/13 0327 09/01/13 0346 09/02/13 0342  NA 145 145 142 141  K 4.2 4.2 5.1 5.0  CL 99 102 96 95*  CO2 38* 37* 36* 36*  GLUCOSE 93 103* 95 100*  BUN 13 18 29* 35*  CREATININE 1.24* 1.33* 1.88* 1.90*  CALCIUM 8.8 8.6 8.6 8.5   Liver Function Tests: No results found for this basename: AST, ALT, ALKPHOS, BILITOT, PROT, ALBUMIN,  in the last 168 hours No results found for this basename: LIPASE, AMYLASE,  in the last 168 hours No results found for this basename: AMMONIA,  in the last 168 hours CBC:  Recent Labs Lab 08/30/13 2206  WBC 10.4  NEUTROABS 7.8*  HGB 12.2  HCT 40.5  MCV 95.7  PLT 218   Cardiac Enzymes:  Recent Labs Lab 08/30/13 2206 08/31/13 0230 08/31/13 0836 08/31/13 1456  TROPONINI <0.30 <0.30 <0.30 <0.30   BNP: BNP (last 3  results)  Recent Labs  06/02/13 2310 06/12/13 0001 08/30/13 2206  PROBNP 97.7 157.9* 584.4*   CBG:  Recent Labs Lab 08/31/13 0815  GLUCAP 129*       SignedDebbe Odea, MD  [facility] 09/02/2013, 11:25 AM

## 2013-09-19 ENCOUNTER — Encounter (HOSPITAL_COMMUNITY): Payer: Self-pay | Admitting: Emergency Medicine

## 2013-09-19 ENCOUNTER — Emergency Department (HOSPITAL_COMMUNITY): Payer: Medicaid Other

## 2013-09-19 ENCOUNTER — Emergency Department (HOSPITAL_COMMUNITY)
Admission: EM | Admit: 2013-09-19 | Discharge: 2013-09-20 | Disposition: A | Discharge status: Home / Self Care | Payer: Medicaid Other | Attending: Emergency Medicine | Admitting: Emergency Medicine

## 2013-09-19 DIAGNOSIS — R0602 Shortness of breath: Secondary | ICD-10-CM

## 2013-09-19 DIAGNOSIS — Z79899 Other long term (current) drug therapy: Secondary | ICD-10-CM | POA: Insufficient documentation

## 2013-09-19 DIAGNOSIS — Z5900 Homelessness unspecified: Secondary | ICD-10-CM | POA: Insufficient documentation

## 2013-09-19 DIAGNOSIS — I10 Essential (primary) hypertension: Secondary | ICD-10-CM | POA: Insufficient documentation

## 2013-09-19 DIAGNOSIS — Q828 Other specified congenital malformations of skin: Secondary | ICD-10-CM | POA: Insufficient documentation

## 2013-09-19 DIAGNOSIS — E785 Hyperlipidemia, unspecified: Secondary | ICD-10-CM | POA: Insufficient documentation

## 2013-09-19 DIAGNOSIS — M129 Arthropathy, unspecified: Secondary | ICD-10-CM | POA: Insufficient documentation

## 2013-09-19 DIAGNOSIS — J45901 Unspecified asthma with (acute) exacerbation: Secondary | ICD-10-CM | POA: Insufficient documentation

## 2013-09-19 DIAGNOSIS — Z7982 Long term (current) use of aspirin: Secondary | ICD-10-CM | POA: Insufficient documentation

## 2013-09-19 DIAGNOSIS — Z880 Allergy status to penicillin: Secondary | ICD-10-CM | POA: Insufficient documentation

## 2013-09-19 DIAGNOSIS — M109 Gout, unspecified: Secondary | ICD-10-CM | POA: Insufficient documentation

## 2013-09-19 DIAGNOSIS — Z791 Long term (current) use of non-steroidal anti-inflammatories (NSAID): Secondary | ICD-10-CM | POA: Insufficient documentation

## 2013-09-19 DIAGNOSIS — Z87891 Personal history of nicotine dependence: Secondary | ICD-10-CM | POA: Insufficient documentation

## 2013-09-19 DIAGNOSIS — I509 Heart failure, unspecified: Secondary | ICD-10-CM | POA: Insufficient documentation

## 2013-09-19 DIAGNOSIS — Z8614 Personal history of Methicillin resistant Staphylococcus aureus infection: Secondary | ICD-10-CM | POA: Insufficient documentation

## 2013-09-19 DIAGNOSIS — K219 Gastro-esophageal reflux disease without esophagitis: Secondary | ICD-10-CM | POA: Insufficient documentation

## 2013-09-19 LAB — CBC WITH DIFFERENTIAL/PLATELET
Basophils Absolute: 0 10*3/uL (ref 0.0–0.1)
Basophils Relative: 0 % (ref 0–1)
Eosinophils Absolute: 0.4 10*3/uL (ref 0.0–0.7)
Eosinophils Relative: 4 % (ref 0–5)
HEMATOCRIT: 41.4 % (ref 36.0–46.0)
Hemoglobin: 12.8 g/dL (ref 12.0–15.0)
LYMPHS PCT: 26 % (ref 12–46)
Lymphs Abs: 2.5 10*3/uL (ref 0.7–4.0)
MCH: 29 pg (ref 26.0–34.0)
MCHC: 30.9 g/dL (ref 30.0–36.0)
MCV: 93.9 fL (ref 78.0–100.0)
MONOS PCT: 5 % (ref 3–12)
Monocytes Absolute: 0.5 10*3/uL (ref 0.1–1.0)
NEUTROS ABS: 6.3 10*3/uL (ref 1.7–7.7)
Neutrophils Relative %: 65 % (ref 43–77)
Platelets: 260 10*3/uL (ref 150–400)
RBC: 4.41 MIL/uL (ref 3.87–5.11)
RDW: 14.7 % (ref 11.5–15.5)
WBC: 9.7 10*3/uL (ref 4.0–10.5)

## 2013-09-19 LAB — BASIC METABOLIC PANEL
BUN: 27 mg/dL — AB (ref 6–23)
CHLORIDE: 104 meq/L (ref 96–112)
CO2: 28 meq/L (ref 19–32)
CREATININE: 1.5 mg/dL — AB (ref 0.50–1.10)
Calcium: 8.6 mg/dL (ref 8.4–10.5)
GFR calc Af Amer: 46 mL/min — ABNORMAL LOW (ref >90)
GFR calc non Af Amer: 40 mL/min — ABNORMAL LOW (ref >90)
Glucose, Bld: 96 mg/dL (ref 70–99)
Potassium: 4.5 mEq/L (ref 3.7–5.3)
Sodium: 143 mEq/L (ref 137–147)

## 2013-09-19 LAB — PRO B NATRIURETIC PEPTIDE: Pro B Natriuretic peptide (BNP): 102.3 pg/mL (ref 0–125)

## 2013-09-19 LAB — I-STAT TROPONIN, ED: Troponin i, poc: 0 ng/mL (ref 0.00–0.08)

## 2013-09-19 NOTE — Discharge Instructions (Signed)

## 2013-09-19 NOTE — ED Notes (Signed)
Per pt, increased swelling and pain in bil ankles, legs.  Also having rt under arm knots.

## 2013-09-19 NOTE — ED Provider Notes (Signed)
CSN: [id number]     Arrival date & time 09/19/13  1723 History   First MD Initiated Contact with Patient 09/19/13 1948     Chief Complaint  Patient presents with  . Leg Swelling     (Consider location/radiation/quality/duration/timing/severity/associated sxs/prior Treatment) HPI Comments: Patent she presents to the emergency department with chief complaint of chest pain, shortness of breath, lower extremity swelling. She has a history of hypertension, hyperlipidemia, and CHF. She states that the symptoms are worsened with exertion. She states that her pain is moderate in severity. She has not tried taking anything to alleviate her symptoms. She was recently admitted for the same, and states that she did not that she was ready to leave the hospital.  The history is provided by the patient. No language interpreter was used.    Past Medical History  Diagnosis Date  . Asthma   . GERD (gastroesophageal reflux disease)   . Hypertension   . Homelessness   . Low back pain   . 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 Arthritis   . Gout   . Hyperlipemia   . Morbid obesity     uses motor wheel chair  . MRSA (methicillin resistant Staphylococcus aureus)     states about a year ago  . CHF (congestive heart failure)    Past Surgical History  Procedure Laterality Date  . Tubal ligation    . Cesarean section     Family History  Problem Relation Age of Onset  . Asthma Mother   . Diabetes Father   . Heart disease Father   . Stroke Father    History  Substance Use Topics  . Smoking status: Former Smoker    Types: Cigarettes    Quit date: 03/25/2003  . Smokeless tobacco: Former Systems developer    Quit date: 05/27/1978  . Alcohol Use: No   OB History   Grav Para Term Preterm Abortions TAB SAB Ect Mult Living                 Review of Systems  All other systems reviewed and are negative.     Allergies  Ibuprofen; Adhesive; Azithromycin; Doxycycline; Penicillins; Sulfa  antibiotics; and Sulfamethoxazole-trimethoprim  Home Medications   Prior to Admission medications   Medication Sig Start Date End Date Taking? Authorizing Provider  acetaminophen (TYLENOL) 325 MG tablet Take 650 mg by mouth every 6 (six) hours as needed for mild pain or headache.    Historical Provider, MD  albuterol (PROVENTIL HFA) 108 (90 BASE) MCG/ACT inhaler Inhale 2 puffs into the lungs every 4 (four) hours as needed for wheezing or shortness of breath. For asthma relief    Historical Provider, MD  albuterol (PROVENTIL) (2.5 MG/3ML) 0.083% nebulizer solution Take 3 mLs (2.5 mg total) by nebulization every 6 (six) hours as needed for wheezing. 08/01/12 09/21/13  Annita Brod, MD  allopurinol (ZYLOPRIM) 100 MG tablet Take 100 mg by mouth every morning.     Historical Provider, MD  amitriptyline (ELAVIL) 75 MG tablet Take 1 tablet (75 mg total) by mouth at bedtime. 03/23/12   Bobby Rumpf York, PA-C  ammonium lactate (AMLACTIN) 12 % cream Apply 1 g topically daily as needed for dry skin. Applies to back and legs.Marland KitchenMarland KitchenIn a thin layer on affected area for dry patches.    Historical Provider, MD  aspirin 325 MG tablet Take 325 mg by mouth every evening.     Historical Provider, MD  atorvastatin (  LIPITOR) 20 MG tablet Take 20 mg by mouth every morning.     Historical Provider, MD  clonazePAM (KLONOPIN) 1 MG tablet Take 1 mg by mouth 2 (two) times daily.    Historical Provider, MD  diphenhydrAMINE (BENADRYL) 25 mg capsule Take 25 mg by mouth every 6 (six) hours as needed for itching or allergies. For itching    Historical Provider, MD  Emollient (EUCERIN) lotion Apply 5 mLs topically 2 (two) times daily as needed for dry skin. On affected areas for dry patches.    Historical Provider, MD  furosemide (LASIX) 40 MG tablet Take 1 tablet (40 mg total) by mouth 2 (two) times daily. 09/02/13   Debbe Odea, MD  HYDROcodone-acetaminophen (NORCO) 10-325 MG per tablet Take 1 tablet by mouth every 6 (six) hours as  needed for pain.    Historical Provider, MD  hydrOXYzine (ATARAX/VISTARIL) 25 MG tablet Take 1 tablet (25 mg total) by mouth every 6 (six) hours. 06/12/13   Teressa Lower, MD  ketoconazole (NIZORAL) 2 % shampoo Apply 1 application topically 2 (two) times a week.    Historical Provider, MD  ketoconazole (NIZORAL) 200 MG tablet Take 200 mg by mouth daily.    Historical Provider, MD  lisinopril (PRINIVIL,ZESTRIL) 40 MG tablet Take 40 mg by mouth daily.    Historical Provider, MD  naproxen (NAPROSYN) 500 MG tablet Take 500 mg by mouth 2 (two) times daily with a meal.    Historical Provider, MD  omega-3 acid ethyl esters (LOVAZA) 1 G capsule Take 1 g by mouth 2 (two) times daily.     Historical Provider, MD  pantoprazole (PROTONIX) 40 MG tablet Take 40 mg by mouth every morning.     Historical Provider, MD  PRESCRIPTION MEDICATION Place 1 drop into both ears 3 (three) times daily.    Historical Provider, MD  promethazine (PHENERGAN) 25 MG tablet Take 25 mg by mouth every 6 (six) hours as needed for nausea. For nausea/vomiting    Historical Provider, MD  solifenacin (VESICARE) 5 MG tablet Take 5 mg by mouth every morning.     Historical Provider, MD  vitamin B-12 (CYANOCOBALAMIN) 1000 MCG tablet Take 1,000 mcg by mouth daily.    Historical Provider, MD  vitamin E 100 UNIT capsule Take 100 Units by mouth every morning.    Historical Provider, MD   BP 106/68  Pulse 90  Temp(Src) 98.5 F (36.9 C) (Oral)  Resp 20  SpO2 94%  LMP 03/24/2009 Physical Exam  Nursing note and vitals reviewed. Constitutional: She is oriented to person, place, and time. She appears well-developed and well-nourished.  Morbidly obese  HENT:  Head: Normocephalic and atraumatic.  Eyes: Conjunctivae and EOM are normal. Pupils are equal, round, and reactive to light.  Neck: Normal range of motion. Neck supple.  Cardiovascular: Normal rate and regular rhythm.  Exam reveals no gallop and no friction rub.   No murmur  heard. Pulmonary/Chest: Effort normal and breath sounds normal. No respiratory distress. She has no wheezes. She has no rales. She exhibits no tenderness.  Lungs are clear to auscultation  Abdominal: Soft. Bowel sounds are normal. She exhibits no distension and no mass. There is no tenderness. There is no rebound and no guarding.  No focal abdominal tenderness, no RLQ tenderness or pain at McBurney's point, no RUQ tenderness or Murphy's sign, no left-sided abdominal tenderness, no fluid wave, or signs of peritonitis   Musculoskeletal: Normal range of motion. She exhibits no edema and no tenderness.  No pitting edema  Neurological: She is alert and oriented to person, place, and time.  Skin: Skin is warm and dry.  No abscess, induration, or erythema of the right axilla  Psychiatric: She has a normal mood and affect. Her behavior is normal. Judgment and thought content normal.    ED Course  Procedures (including critical care time) Results for orders placed during the hospital encounter of 09/19/13  CBC WITH DIFFERENTIAL      Result Value Ref Range   WBC 9.7  4.0 - 10.5 K/uL   RBC 4.41  3.87 - 5.11 MIL/uL   Hemoglobin 12.8  12.0 - 15.0 g/dL   HCT 41.4  36.0 - 46.0 %   MCV 93.9  78.0 - 100.0 fL   MCH 29.0  26.0 - 34.0 pg   MCHC 30.9  30.0 - 36.0 g/dL   RDW 14.7  11.5 - 15.5 %   Platelets 260  150 - 400 K/uL   Neutrophils Relative % 65  43 - 77 %   Neutro Abs 6.3  1.7 - 7.7 K/uL   Lymphocytes Relative 26  12 - 46 %   Lymphs Abs 2.5  0.7 - 4.0 K/uL   Monocytes Relative 5  3 - 12 %   Monocytes Absolute 0.5  0.1 - 1.0 K/uL   Eosinophils Relative 4  0 - 5 %   Eosinophils Absolute 0.4  0.0 - 0.7 K/uL   Basophils Relative 0  0 - 1 %   Basophils Absolute 0.0  0.0 - 0.1 K/uL  BASIC METABOLIC PANEL      Result Value Ref Range   Sodium 143  137 - 147 mEq/L   Potassium 4.5  3.7 - 5.3 mEq/L   Chloride 104  96 - 112 mEq/L   CO2 28  19 - 32 mEq/L   Glucose, Bld 96  70 - 99 mg/dL   BUN 27  (*) 6 - 23 mg/dL   Creatinine, Ser 1.50 (*) 0.50 - 1.10 mg/dL   Calcium 8.6  8.4 - 10.5 mg/dL   GFR calc non Af Amer 40 (*) >90 mL/min   GFR calc Af Amer 46 (*) >90 mL/min  PRO B NATRIURETIC PEPTIDE      Result Value Ref Range   Pro B Natriuretic peptide (BNP) 102.3  0 - 125 pg/mL  I-STAT TROPOININ, ED      Result Value Ref Range   Troponin i, poc 0.00  0.00 - 0.08 ng/mL   Comment 3            Dg Chest 2 View  09/19/2013   CLINICAL DATA:  Shortness of breath. Prior history of CHF. Current history of asthma and hypertension.  EXAM: CHEST  2 VIEW  COMPARISON:  Portable chest x-ray 09/01/2013. Two-view chest x-ray 08/30/2013 dating back to 06/02/2013.  FINDINGS: Suboptimal inspiration due to body habitus accounts for crowded bronchovascular markings, especially in the bases, and accentuates the cardiac silhouette. Taking this into account, Cardiac silhouette mildly to moderately enlarged but stable. Hilar and mediastinal contours otherwise unremarkable. Lungs clear. Bronchovascular markings normal. Pulmonary vascularity normal. No visible pleural effusions. No pneumothorax. Lateral image blurred by respiratory motion. Degenerative changes throughout the thoracic spine.  IMPRESSION: Markedly suboptimal inspiration due to body habitus. Stable cardiomegaly. No acute cardiopulmonary disease.   Electronically Signed   By: Evangeline Dakin M.D.   On: 09/19/2013 21:07   Dg Chest 2 View  08/30/2013   CLINICAL DATA:  Shortness of breath with  upper abdominal pain.  EXAM: CHEST  2 VIEW  COMPARISON:  06/12/2013.  FINDINGS: Cardiomegaly. Moderate vascular congestion. Early pulmonary edema not excluded. No effusion or pneumothorax. Negative osseous structures. Worsening aeration.  IMPRESSION: Cardiomegaly. Moderate vascular congestion with possible early pulmonary edema.   Electronically Signed   By: Rolla Flatten M.D.   On: 08/30/2013 22:11   Dg Chest Port 1 View  09/01/2013   CLINICAL DATA:  CHF.  Asthma.  EXAM:  PORTABLE CHEST - 1 VIEW  COMPARISON:  08/30/2013 .  FINDINGS: Cardiomegaly with pulmonary vascular prominence and interstitial prominence noted consistent with congestive heart failure mild interstitial edema. No focal alveolar infiltrate noted. No pleural effusion or pneumothorax.  IMPRESSION: Mild congestive heart failure .   Electronically Signed   By: Marcello Moores  Register   On: 09/01/2013 13:31     Imaging Review No results found.   EKG Interpretation None      MDM   Final diagnoses:  Shortness of breath   Patient with normal labs. 02 saturation is in the high 90s on room air. Heart score is 3, doubt ACS.  No history of PE or DVT, no recent surgery, no hemoptysis, no exogenous estrogen use, no unilateral leg swelling, no recent long travel. Stable chest x-ray. Stable BNP.  Patient repeatedly changed her complaint asking if she can be admitted to the hospital to sleep overnight.  Patient seen by and discussed with Dr. Tawnya Crook, who recommends discharge to home.  Patient is stable and ready for discharge.     Montine Circle, PA-C 09/19/13 [id number]

## 2013-09-19 NOTE — ED Notes (Signed)
Urine collected.

## 2013-09-20 NOTE — ED Provider Notes (Signed)
Medical screening examination/treatment/procedure(s) were conducted as a shared visit with non-physician practitioner(s) and myself.  I personally evaluated the patient during the encounter. Pt presents w/ multiple complaints including feeling like her R lung is full of fluid, her O2 is low, swelling in legs. VSS, pt in NAD, 96% on RA laying flat in bed. Lungs clear. No pitting edema on BLLE, though is morbidly obese. EKG w/o ischemic changes, CXR unchanged, BNP no significantly elevated. When discussing findings, pt asks multiple times to be admitted to the hospital, is adding complaints that she did not previously report. I believe she is safe for d/c and can f/u as outpt. She does not have clinical course c/w CHF, pna, PE, or ACS.    EKG Interpretation   Date/Time:  Monday September 19 2013 20:22:39 EDT Ventricular Rate:  65 PR Interval:  157 QRS Duration: 100 QT Interval:  413 QTC Calculation: 429 R Axis:   61 Text Interpretation:  Sinus rhythm Low voltage, precordial leads No  significant change was found Confirmed by Raton  MD, MEGAN ([id number]) on  09/19/2013 10:50:18 PM        Neta Ehlers, MD 09/20/13 0006

## 2013-10-12 ENCOUNTER — Encounter (HOSPITAL_COMMUNITY): Payer: Self-pay | Admitting: Emergency Medicine

## 2013-10-12 ENCOUNTER — Emergency Department (HOSPITAL_COMMUNITY)
Admission: EM | Admit: 2013-10-12 | Discharge: 2013-10-12 | Disposition: A | Discharge status: Home / Self Care | Payer: Medicaid Other | Attending: Emergency Medicine | Admitting: Emergency Medicine

## 2013-10-12 ENCOUNTER — Emergency Department (HOSPITAL_COMMUNITY)
Admission: EM | Admit: 2013-10-12 | Discharge: 2013-10-12 | Disposition: A | Discharge status: Home / Self Care | Payer: Medicaid Other | Source: Home / Self Care | Attending: Emergency Medicine | Admitting: Emergency Medicine

## 2013-10-12 DIAGNOSIS — M109 Gout, unspecified: Secondary | ICD-10-CM | POA: Insufficient documentation

## 2013-10-12 DIAGNOSIS — K219 Gastro-esophageal reflux disease without esophagitis: Secondary | ICD-10-CM

## 2013-10-12 DIAGNOSIS — R21 Rash and other nonspecific skin eruption: Secondary | ICD-10-CM

## 2013-10-12 DIAGNOSIS — I509 Heart failure, unspecified: Secondary | ICD-10-CM

## 2013-10-12 DIAGNOSIS — Z5900 Homelessness unspecified: Secondary | ICD-10-CM | POA: Insufficient documentation

## 2013-10-12 DIAGNOSIS — Z880 Allergy status to penicillin: Secondary | ICD-10-CM

## 2013-10-12 DIAGNOSIS — I10 Essential (primary) hypertension: Secondary | ICD-10-CM | POA: Insufficient documentation

## 2013-10-12 DIAGNOSIS — Z7982 Long term (current) use of aspirin: Secondary | ICD-10-CM | POA: Diagnosis not present

## 2013-10-12 DIAGNOSIS — Z791 Long term (current) use of non-steroidal anti-inflammatories (NSAID): Secondary | ICD-10-CM | POA: Diagnosis not present

## 2013-10-12 DIAGNOSIS — Z87768 Personal history of other specified (corrected) congenital malformations of integument, limbs and musculoskeletal system: Secondary | ICD-10-CM | POA: Insufficient documentation

## 2013-10-12 DIAGNOSIS — E785 Hyperlipidemia, unspecified: Secondary | ICD-10-CM | POA: Insufficient documentation

## 2013-10-12 DIAGNOSIS — Z87891 Personal history of nicotine dependence: Secondary | ICD-10-CM

## 2013-10-12 DIAGNOSIS — Z8776 Personal history of (corrected) congenital malformations of integument, limbs and musculoskeletal system: Secondary | ICD-10-CM

## 2013-10-12 DIAGNOSIS — L299 Pruritus, unspecified: Secondary | ICD-10-CM | POA: Diagnosis present

## 2013-10-12 DIAGNOSIS — Z79899 Other long term (current) drug therapy: Secondary | ICD-10-CM | POA: Insufficient documentation

## 2013-10-12 DIAGNOSIS — Z8614 Personal history of Methicillin resistant Staphylococcus aureus infection: Secondary | ICD-10-CM | POA: Insufficient documentation

## 2013-10-12 DIAGNOSIS — J45909 Unspecified asthma, uncomplicated: Secondary | ICD-10-CM

## 2013-10-12 MED ORDER — DIPHENHYDRAMINE HCL 25 MG PO CAPS
25.0000 mg | ORAL_CAPSULE | Freq: Once | ORAL | Status: AC
  Administered 2013-10-12: 25 mg via ORAL
  Filled 2013-10-12: qty 1

## 2013-10-12 MED ORDER — PERMETHRIN 5 % EX CREA: TOPICAL_CREAM | CUTANEOUS | Status: DC

## 2013-10-12 NOTE — ED Notes (Signed)
Patient is alert and oriented x3.  She was given DC instructions and follow up visit instructions.  Patient gave verbal understanding. She was DC ambulatory under her own power to home.  V/S stable.  He was not showing any signs of distress on DC 

## 2013-10-12 NOTE — ED Notes (Addendum)
Pt was just seen earlier this morning for bed bugs and rash. Pt was given cream from PCP and states it is not getting any better. Pt asking if she may get admitted for this. Pt also c/o pain in legs and swelling.

## 2013-10-12 NOTE — Discharge Instructions (Signed)
Apply the cream prescribed to you earlier today as directed. This may take up to a week to clear.  Rash A rash is a change in the color or texture of your skin. There are many different types of rashes. You may have other problems that accompany your rash. CAUSES   Infections.  Allergic reactions. This can include allergies to pets or foods.  Certain medicines.  Exposure to certain chemicals, soaps, or cosmetics.  Heat.  Exposure to poisonous plants.  Tumors, both cancerous and noncancerous. SYMPTOMS   Redness.  Scaly skin.  Itchy skin.  Dry or cracked skin.  Bumps.  Blisters.  Pain. DIAGNOSIS  Your caregiver may do a physical exam to determine what type of rash you have. A skin sample (biopsy) may be taken and examined under a microscope. TREATMENT  Treatment depends on the type of rash you have. Your caregiver may prescribe certain medicines. For serious conditions, you may need to see a skin doctor (dermatologist). HOME CARE INSTRUCTIONS   Avoid the substance that caused your rash.  Do not scratch your rash. This can cause infection.  You may take cool baths to help stop itching.  Only take over-the-counter or prescription medicines as directed by your caregiver.  Keep all follow-up appointments as directed by your caregiver. SEEK IMMEDIATE MEDICAL CARE IF:  You have increasing pain, swelling, or redness.  You have a fever.  You have new or severe symptoms.  You have body aches, diarrhea, or vomiting.  Your rash is not better after 3 days. MAKE SURE YOU:  Understand these instructions.  Will watch your condition.  Will get help right away if you are not doing well or get worse. Document Released: 02/28/2002 Document Revised: 06/02/2011 Document Reviewed: 12/23/2010 [facility] Patient Information 2015 [place], [place]. This information is not intended to replace advice given to you by your health care provider. Make sure you discuss any questions you  have with your health care provider.

## 2013-10-12 NOTE — ED Notes (Signed)
Patient is alert and oriented x3.  She arrived via bus.  Patient states that she had bed bugs at home  And had to leave for 2 days during the exterminations.  Patient states that she her bite areas are not getting  Any better.  Currently she rates her pain 10 of 10.

## 2013-10-12 NOTE — ED Provider Notes (Signed)
Medical screening examination/treatment/procedure(s) were performed by non-physician practitioner and as supervising physician I was immediately available for consultation/collaboration.    Dorie Rank, MD 10/12/13 [phone]

## 2013-10-12 NOTE — ED Provider Notes (Signed)
CSN: [id number]     Arrival date & time 10/12/13  0831 History   First MD Initiated Contact with Patient 10/12/13 [phone]     Chief Complaint  Patient presents with  . rash from bed bugs      (Consider location/radiation/quality/duration/timing/severity/associated sxs/prior Treatment) HPI Comments: 48 year old female presents to the emergency department complaining of continued rash all over her body that she was seen for earlier today. States she was diagnosed with scabies and given a prescription for a cream which she did not fill. The rash has not worsened, states she "just wants it to go away". The rash is itchy. Denies difficulty breathing, swelling, fever or chills. States she believes these are bed bugs, an exterminator came to her house 2 days ago. Patient is also requesting Vicodin. States she normally has a prescription for this at home but is out. It normally comes from her PCP. She has not tried calling her PCP.  The history is provided by the patient.    Past Medical History  Diagnosis Date  . Asthma   . GERD (gastroesophageal reflux disease)   . Hypertension   . Homelessness   . Low back pain   . 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 Arthritis   . Gout   . Hyperlipemia   . Morbid obesity     uses motor wheel chair  . MRSA (methicillin resistant Staphylococcus aureus)     states about a year ago  . CHF (congestive heart failure)    Past Surgical History  Procedure Laterality Date  . Tubal ligation    . Cesarean section     Family History  Problem Relation Age of Onset  . Asthma Mother   . Diabetes Father   . Heart disease Father   . Stroke Father    History  Substance Use Topics  . Smoking status: Former Smoker    Types: Cigarettes    Quit date: 03/25/2003  . Smokeless tobacco: Former Systems developer    Quit date: 05/27/1978  . Alcohol Use: No   OB History   Grav Para Term Preterm Abortions TAB SAB Ect Mult Living                 Review of  Systems  Skin: Positive for rash.  All other systems reviewed and are negative.     Allergies  Ibuprofen; Adhesive; Azithromycin; Doxycycline; Penicillins; Sulfa antibiotics; and Sulfamethoxazole-trimethoprim  Home Medications   Prior to Admission medications   Medication Sig Start Date End Date Taking? Authorizing Provider  albuterol (PROVENTIL HFA) 108 (90 BASE) MCG/ACT inhaler Inhale 2 puffs into the lungs every 4 (four) hours as needed for wheezing or shortness of breath.    Yes Historical Provider, MD  albuterol (PROVENTIL) (2.5 MG/3ML) 0.083% nebulizer solution Take 2.5 mg by nebulization every 6 (six) hours as needed for wheezing or shortness of breath.   Yes Historical Provider, MD  allopurinol (ZYLOPRIM) 100 MG tablet Take 100 mg by mouth every morning.    Yes Historical Provider, MD  amitriptyline (ELAVIL) 75 MG tablet Take 75 mg by mouth at bedtime.   Yes Historical Provider, MD  ammonium lactate (AMLACTIN) 12 % cream Apply 1 g topically daily as needed for dry skin.    Yes Historical Provider, MD  aspirin 325 MG tablet Take 325 mg by mouth every morning.    Yes Historical Provider, MD  atorvastatin (LIPITOR) 20 MG tablet Take 20 mg by mouth every  morning.    Yes Historical Provider, MD  clonazePAM (KLONOPIN) 1 MG tablet Take 1 mg by mouth 2 (two) times daily.   Yes Historical Provider, MD  diphenhydrAMINE (BENADRYL) 25 mg capsule Take 25 mg by mouth every 6 (six) hours as needed for itching or allergies.    Yes Historical Provider, MD  Emollient (EUCERIN) lotion Apply 5 mLs topically 2 (two) times daily as needed for dry skin.    Yes Historical Provider, MD  furosemide (LASIX) 40 MG tablet Take 40 mg by mouth 2 (two) times daily.   Yes Historical Provider, MD  HYDROcodone-acetaminophen (NORCO) 10-325 MG per tablet Take 1 tablet by mouth every 6 (six) hours as needed for pain.   Yes Historical Provider, MD  hydrOXYzine (ATARAX/VISTARIL) 25 MG tablet Take 25 mg by mouth 3 (three)  times daily as needed for anxiety or itching.   Yes Historical Provider, MD  ketoconazole (NIZORAL) 2 % shampoo Apply 1 application topically 2 (two) times a week.   Yes Historical Provider, MD  ketoconazole (NIZORAL) 200 MG tablet Take 200 mg by mouth every morning.    Yes Historical Provider, MD  lisinopril (PRINIVIL,ZESTRIL) 40 MG tablet Take 40 mg by mouth 2 (two) times daily.    Yes Historical Provider, MD  naproxen (NAPROSYN) 500 MG tablet Take 500 mg by mouth 2 (two) times daily with a meal.   Yes Historical Provider, MD  omega-3 acid ethyl esters (LOVAZA) 1 G capsule Take 1 g by mouth 2 (two) times daily.    Yes Historical Provider, MD  pantoprazole (PROTONIX) 40 MG tablet Take 40 mg by mouth every morning.    Yes Historical Provider, MD  promethazine (PHENERGAN) 25 MG tablet Take 25 mg by mouth every 6 (six) hours as needed for nausea.    Yes Historical Provider, MD  solifenacin (VESICARE) 5 MG tablet Take 5 mg by mouth every morning.    Yes Historical Provider, MD  vitamin B-12 (CYANOCOBALAMIN) 1000 MCG tablet Take 1,000 mcg by mouth every morning.    Yes Historical Provider, MD  vitamin E 100 UNIT capsule Take 100 Units by mouth every morning.   Yes Historical Provider, MD  permethrin (ACTICIN) 5 % cream Thoroughly massage into the skin from the neck to the soles of the feet. The cream should be removed by washing (shower or bath) after 8 to 14 hours. Repeat in 1 week. 10/12/13   Hoy Morn, MD   BP 138/85  Pulse 77  Temp(Src) 98.5 F (36.9 C) (Oral)  Resp 20  SpO2 99%  LMP 03/24/2009 Physical Exam  Nursing note and vitals reviewed. Constitutional: She is oriented to person, place, and time. She appears well-developed and well-nourished. No distress.  Morbidly obese.  HENT:  Head: Normocephalic and atraumatic.  Mouth/Throat: Oropharynx is clear and moist.  Eyes: Conjunctivae and EOM are normal.  Neck: Normal range of motion. Neck supple.  Cardiovascular: Normal rate,  regular rhythm and normal heart sounds.   Pulmonary/Chest: Effort normal and breath sounds normal. No respiratory distress.  Musculoskeletal: Normal range of motion. She exhibits no edema.  Neurological: She is alert and oriented to person, place, and time. No sensory deficit.  Skin: Skin is warm and dry.  Scattered excoriated skin lesions ob arms, legs, abdomen, chest and back. No signs of secondary infection. Spares palms and soles. No mucosal lesions.  Psychiatric: She has a normal mood and affect. Her behavior is normal.    ED Course  Procedures (including critical  care time) Labs Review Labs Reviewed - No data to display  Imaging Review No results found.   EKG Interpretation None      MDM   Final diagnoses:  Rash   Pt seen earlier today, given rx for permethrin which she did not fill. Pt "wants the rash to go away". No changes. Advised her to fill cream and apply it. F/u with PCP. Advised her to discuss vicodin rx with PCP. Stable for d/c. Return precautions given. Patient states understanding of treatment care plan and is agreeable.  Illene Labrador, PA-C 10/12/13 [phone]

## 2013-10-12 NOTE — ED Provider Notes (Signed)
CSN: [id number]     Arrival date & time 10/12/13  0013 History   First MD Initiated Contact with Patient 10/12/13 0122     Chief Complaint  Patient presents with  . Insect Bite      HPI Patient presents the emergency department with complaints of insect bites all over her body.  She states they itch and she states they're not getting any better.  She reports an exterminator came 2 days ago.  She believes these are bed bugs.  If fevers or chills.  Reports pain.  States she normally has Vicodin at home but is currently out of this.   Past Medical History  Diagnosis Date  . Asthma   . GERD (gastroesophageal reflux disease)   . Hypertension   . Homelessness   . Low back pain   . 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 Arthritis   . Gout   . Hyperlipemia   . Morbid obesity     uses motor wheel chair  . MRSA (methicillin resistant Staphylococcus aureus)     states about a year ago  . CHF (congestive heart failure)    Past Surgical History  Procedure Laterality Date  . Tubal ligation    . Cesarean section     Family History  Problem Relation Age of Onset  . Asthma Mother   . Diabetes Father   . Heart disease Father   . Stroke Father    History  Substance Use Topics  . Smoking status: Former Smoker    Types: Cigarettes    Quit date: 03/25/2003  . Smokeless tobacco: Former Systems developer    Quit date: 05/27/1978  . Alcohol Use: No   OB History   Grav Para Term Preterm Abortions TAB SAB Ect Mult Living                 Review of Systems  All other systems reviewed and are negative.     Allergies  Ibuprofen; Adhesive; Azithromycin; Doxycycline; Penicillins; Sulfa antibiotics; and Sulfamethoxazole-trimethoprim  Home Medications   Prior to Admission medications   Medication Sig Start Date End Date Taking? Authorizing Provider  albuterol (PROVENTIL HFA) 108 (90 BASE) MCG/ACT inhaler Inhale 2 puffs into the lungs every 4 (four) hours as needed for wheezing or  shortness of breath.    Yes Historical Provider, MD  albuterol (PROVENTIL) (2.5 MG/3ML) 0.083% nebulizer solution Take 2.5 mg by nebulization every 6 (six) hours as needed for wheezing or shortness of breath.   Yes Historical Provider, MD  allopurinol (ZYLOPRIM) 100 MG tablet Take 100 mg by mouth every morning.    Yes Historical Provider, MD  amitriptyline (ELAVIL) 75 MG tablet Take 75 mg by mouth at bedtime.   Yes Historical Provider, MD  ammonium lactate (AMLACTIN) 12 % cream Apply 1 g topically daily as needed for dry skin.    Yes Historical Provider, MD  aspirin 325 MG tablet Take 325 mg by mouth every morning.    Yes Historical Provider, MD  atorvastatin (LIPITOR) 20 MG tablet Take 20 mg by mouth every morning.    Yes Historical Provider, MD  clonazePAM (KLONOPIN) 1 MG tablet Take 1 mg by mouth 2 (two) times daily.   Yes Historical Provider, MD  diphenhydrAMINE (BENADRYL) 25 mg capsule Take 25 mg by mouth every 6 (six) hours as needed for itching or allergies.    Yes Historical Provider, MD  Emollient (EUCERIN) lotion Apply 5 mLs topically 2 (two)  times daily as needed for dry skin.    Yes Historical Provider, MD  furosemide (LASIX) 40 MG tablet Take 40 mg by mouth 2 (two) times daily.   Yes Historical Provider, MD  HYDROcodone-acetaminophen (NORCO) 10-325 MG per tablet Take 1 tablet by mouth every 6 (six) hours as needed for pain.   Yes Historical Provider, MD  hydrOXYzine (ATARAX/VISTARIL) 25 MG tablet Take 25 mg by mouth 3 (three) times daily as needed for anxiety or itching.   Yes Historical Provider, MD  ketoconazole (NIZORAL) 2 % shampoo Apply 1 application topically 2 (two) times a week.   Yes Historical Provider, MD  ketoconazole (NIZORAL) 200 MG tablet Take 200 mg by mouth every morning.    Yes Historical Provider, MD  lisinopril (PRINIVIL,ZESTRIL) 40 MG tablet Take 40 mg by mouth 2 (two) times daily.    Yes Historical Provider, MD  naproxen (NAPROSYN) 500 MG tablet Take 500 mg by mouth  2 (two) times daily with a meal.   Yes Historical Provider, MD  omega-3 acid ethyl esters (LOVAZA) 1 G capsule Take 1 g by mouth 2 (two) times daily.    Yes Historical Provider, MD  pantoprazole (PROTONIX) 40 MG tablet Take 40 mg by mouth every morning.    Yes Historical Provider, MD  promethazine (PHENERGAN) 25 MG tablet Take 25 mg by mouth every 6 (six) hours as needed for nausea.    Yes Historical Provider, MD  solifenacin (VESICARE) 5 MG tablet Take 5 mg by mouth every morning.    Yes Historical Provider, MD  vitamin B-12 (CYANOCOBALAMIN) 1000 MCG tablet Take 1,000 mcg by mouth every morning.    Yes Historical Provider, MD  vitamin E 100 UNIT capsule Take 100 Units by mouth every morning.   Yes Historical Provider, MD  permethrin (ACTICIN) 5 % cream Thoroughly massage into the skin from the neck to the soles of the feet. The cream should be removed by washing (shower or bath) after 8 to 14 hours. Repeat in 1 week. 10/12/13   Hoy Morn, MD   BP 108/75  Pulse 77  Temp(Src) 99.1 F (37.3 C) (Oral)  Resp 20  SpO2 93%  LMP 03/24/2009 Physical Exam  Nursing note and vitals reviewed. Constitutional: She is oriented to person, place, and time. She appears well-developed and well-nourished. No distress.  HENT:  Head: Normocephalic and atraumatic.  Eyes: EOM are normal.  Neck: Normal range of motion.  Cardiovascular: Normal rate and regular rhythm.   Pulmonary/Chest: Effort normal and breath sounds normal.  Abdominal: Soft. She exhibits no distension.  Musculoskeletal: Normal range of motion.  Neurological: She is alert and oriented to person, place, and time.  Skin: Skin is warm and dry.  Excoriated skin lesions throughout the majority of all of her body.  There is no secondary signs of infection at this time.  Lesions appear to be and multiple stages of healing  Psychiatric: She has a normal mood and affect. Judgment normal.    ED Course  Procedures (including critical care  time) Labs Review Labs Reviewed - No data to display  Imaging Review No results found.   EKG Interpretation None      MDM   Final diagnoses:  Rash    Excoriated skin lesions.  Patient states these are bed bugs.  Could represent scabies.  Patient will be given scabies treatment.  Recommended antibiotic ointment to all skin lesions.  She understands to return to the ER for new or worsening symptoms.  Close PCP followup.    Hoy Morn, MD 10/12/13 [phone]

## 2013-11-04 ENCOUNTER — Emergency Department (HOSPITAL_COMMUNITY)
Admission: EM | Admit: 2013-11-04 | Discharge: 2013-11-04 | Disposition: A | Discharge status: Home / Self Care | Payer: Medicaid Other | Attending: Emergency Medicine | Admitting: Emergency Medicine

## 2013-11-04 ENCOUNTER — Encounter (HOSPITAL_COMMUNITY): Payer: Self-pay | Admitting: Emergency Medicine

## 2013-11-04 DIAGNOSIS — W57XXXA Bitten or stung by nonvenomous insect and other nonvenomous arthropods, initial encounter: Secondary | ICD-10-CM | POA: Diagnosis present

## 2013-11-04 DIAGNOSIS — I509 Heart failure, unspecified: Secondary | ICD-10-CM | POA: Insufficient documentation

## 2013-11-04 DIAGNOSIS — I10 Essential (primary) hypertension: Secondary | ICD-10-CM | POA: Diagnosis not present

## 2013-11-04 DIAGNOSIS — M109 Gout, unspecified: Secondary | ICD-10-CM | POA: Diagnosis not present

## 2013-11-04 DIAGNOSIS — M129 Arthropathy, unspecified: Secondary | ICD-10-CM | POA: Insufficient documentation

## 2013-11-04 DIAGNOSIS — K219 Gastro-esophageal reflux disease without esophagitis: Secondary | ICD-10-CM | POA: Insufficient documentation

## 2013-11-04 DIAGNOSIS — Z880 Allergy status to penicillin: Secondary | ICD-10-CM | POA: Diagnosis not present

## 2013-11-04 DIAGNOSIS — Z8614 Personal history of Methicillin resistant Staphylococcus aureus infection: Secondary | ICD-10-CM | POA: Insufficient documentation

## 2013-11-04 DIAGNOSIS — S90569A Insect bite (nonvenomous), unspecified ankle, initial encounter: Secondary | ICD-10-CM | POA: Diagnosis present

## 2013-11-04 DIAGNOSIS — Y9389 Activity, other specified: Secondary | ICD-10-CM | POA: Insufficient documentation

## 2013-11-04 DIAGNOSIS — Z791 Long term (current) use of non-steroidal anti-inflammatories (NSAID): Secondary | ICD-10-CM | POA: Insufficient documentation

## 2013-11-04 DIAGNOSIS — J45909 Unspecified asthma, uncomplicated: Secondary | ICD-10-CM | POA: Insufficient documentation

## 2013-11-04 DIAGNOSIS — Z87891 Personal history of nicotine dependence: Secondary | ICD-10-CM | POA: Insufficient documentation

## 2013-11-04 DIAGNOSIS — IMO0002 Reserved for concepts with insufficient information to code with codable children: Secondary | ICD-10-CM | POA: Diagnosis not present

## 2013-11-04 DIAGNOSIS — E785 Hyperlipidemia, unspecified: Secondary | ICD-10-CM | POA: Diagnosis not present

## 2013-11-04 DIAGNOSIS — Z5900 Homelessness unspecified: Secondary | ICD-10-CM | POA: Diagnosis not present

## 2013-11-04 DIAGNOSIS — R21 Rash and other nonspecific skin eruption: Secondary | ICD-10-CM | POA: Diagnosis not present

## 2013-11-04 DIAGNOSIS — Y9289 Other specified places as the place of occurrence of the external cause: Secondary | ICD-10-CM | POA: Insufficient documentation

## 2013-11-04 DIAGNOSIS — Z79899 Other long term (current) drug therapy: Secondary | ICD-10-CM | POA: Diagnosis not present

## 2013-11-04 DIAGNOSIS — L080 Pyoderma: Secondary | ICD-10-CM

## 2013-11-04 DIAGNOSIS — Z7982 Long term (current) use of aspirin: Secondary | ICD-10-CM | POA: Insufficient documentation

## 2013-11-04 MED ORDER — HYDROCODONE-ACETAMINOPHEN 5-325 MG PO TABS: 1.0000 | ORAL_TABLET | Freq: Four times a day (QID) | ORAL | Status: DC | PRN

## 2013-11-04 MED ORDER — MUPIROCIN CALCIUM 2 % EX CREA
TOPICAL_CREAM | Freq: Three times a day (TID) | CUTANEOUS | Status: DC
  Administered 2013-11-04: 03:00:00 via TOPICAL
  Filled 2013-11-04: qty 15

## 2013-11-04 NOTE — ED Notes (Signed)
Pt presents with c/o insect bites. Pt says that she was bitten by fire ants on her right foot and ankle. Per EMS, pt does have some blister-like bites to that area. Pt also recently had bed bugs in her home.

## 2013-11-04 NOTE — ED Provider Notes (Signed)
CSN: [id number]     Arrival date & time 11/04/13  0108 History   First MD Initiated Contact with Patient 11/04/13 0220     Chief Complaint  Patient presents with  . Insect Bites      (Consider location/radiation/quality/duration/timing/severity/associated sxs/prior Treatment) HPI This is a 48 year old female was seen about 3 weeks ago for a rash. She was treated for possible scabies with permethrin. She states she used this cream. She complains of "bed bug bites" over her abdomen and arms despite having her house sprayed for bedbugs. She is here now for red ant bites to her ankles and feet. These bites are multiple and have become pustules. She states they are painful.   She has a history of morbid obesity and CHF with chronic oxygen use and lower extremity edema.  Past Medical History  Diagnosis Date  . Asthma   . GERD (gastroesophageal reflux disease)   . Hypertension   . Homelessness   . Low back pain   . 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 Arthritis   . Gout   . Hyperlipemia   . Morbid obesity     uses motor wheel chair  . MRSA (methicillin resistant Staphylococcus aureus)     states about a year ago  . CHF (congestive heart failure)    Past Surgical History  Procedure Laterality Date  . Tubal ligation    . Cesarean section     Family History  Problem Relation Age of Onset  . Asthma Mother   . Diabetes Father   . Heart disease Father   . Stroke Father    History  Substance Use Topics  . Smoking status: Former Smoker    Types: Cigarettes    Quit date: 03/25/2003  . Smokeless tobacco: Former Systems developer    Quit date: 05/27/1978  . Alcohol Use: No   OB History   Grav Para Term Preterm Abortions TAB SAB Ect Mult Living                 Review of Systems  All other systems reviewed and are negative.   Allergies  Ibuprofen; Adhesive; Azithromycin; Doxycycline; Penicillins; Sulfa antibiotics; and Sulfamethoxazole-trimethoprim  Home Medications    Prior to Admission medications   Medication Sig Start Date End Date Taking? Authorizing Provider  albuterol (PROVENTIL HFA) 108 (90 BASE) MCG/ACT inhaler Inhale 2 puffs into the lungs every 4 (four) hours as needed for wheezing or shortness of breath.     Historical Provider, MD  albuterol (PROVENTIL) (2.5 MG/3ML) 0.083% nebulizer solution Take 2.5 mg by nebulization every 6 (six) hours as needed for wheezing or shortness of breath.    Historical Provider, MD  allopurinol (ZYLOPRIM) 100 MG tablet Take 100 mg by mouth every morning.     Historical Provider, MD  amitriptyline (ELAVIL) 75 MG tablet Take 75 mg by mouth at bedtime.    Historical Provider, MD  ammonium lactate (AMLACTIN) 12 % cream Apply 1 g topically daily as needed for dry skin.     Historical Provider, MD  aspirin 325 MG tablet Take 325 mg by mouth every morning.     Historical Provider, MD  atorvastatin (LIPITOR) 20 MG tablet Take 20 mg by mouth every morning.     Historical Provider, MD  clonazePAM (KLONOPIN) 1 MG tablet Take 1 mg by mouth 2 (two) times daily.    Historical Provider, MD  diphenhydrAMINE (BENADRYL) 25 mg capsule Take 25 mg by mouth  every 6 (six) hours as needed for itching or allergies.     Historical Provider, MD  Emollient (EUCERIN) lotion Apply 5 mLs topically 2 (two) times daily as needed for dry skin.     Historical Provider, MD  furosemide (LASIX) 40 MG tablet Take 40 mg by mouth 2 (two) times daily.    Historical Provider, MD  HYDROcodone-acetaminophen (NORCO) 10-325 MG per tablet Take 1 tablet by mouth every 6 (six) hours as needed for pain.    Historical Provider, MD  hydrOXYzine (ATARAX/VISTARIL) 25 MG tablet Take 25 mg by mouth 3 (three) times daily as needed for anxiety or itching.    Historical Provider, MD  ketoconazole (NIZORAL) 2 % shampoo Apply 1 application topically 2 (two) times a week.    Historical Provider, MD  ketoconazole (NIZORAL) 200 MG tablet Take 200 mg by mouth every morning.      Historical Provider, MD  lisinopril (PRINIVIL,ZESTRIL) 40 MG tablet Take 40 mg by mouth 2 (two) times daily.     Historical Provider, MD  naproxen (NAPROSYN) 500 MG tablet Take 500 mg by mouth 2 (two) times daily with a meal.    Historical Provider, MD  omega-3 acid ethyl esters (LOVAZA) 1 G capsule Take 1 g by mouth 2 (two) times daily.     Historical Provider, MD  pantoprazole (PROTONIX) 40 MG tablet Take 40 mg by mouth every morning.     Historical Provider, MD  permethrin (ACTICIN) 5 % cream Thoroughly massage into the skin from the neck to the soles of the feet. The cream should be removed by washing (shower or bath) after 8 to 14 hours. Repeat in 1 week. 10/12/13   Hoy Morn, MD  promethazine (PHENERGAN) 25 MG tablet Take 25 mg by mouth every 6 (six) hours as needed for nausea.     Historical Provider, MD  solifenacin (VESICARE) 5 MG tablet Take 5 mg by mouth every morning.     Historical Provider, MD  vitamin B-12 (CYANOCOBALAMIN) 1000 MCG tablet Take 1,000 mcg by mouth every morning.     Historical Provider, MD  vitamin E 100 UNIT capsule Take 100 Units by mouth every morning.    Historical Provider, MD   BP 133/79  Pulse 86  Temp(Src) 98.4 F (36.9 C) (Oral)  Resp 22  SpO2 93%  LMP 03/24/2009  Physical Exam General: Well-developed, morbidly obese female in no acute distress; appearance consistent with age of record HENT: normocephalic; atraumatic Eyes: Normal appearance Neck: supple Heart: regular rate and rhyth Lungs: Normal respiratory effort and excursion; Abdomen: soft; obese Extremities: No deformity; lower extremity edema Neurologic: Awake, alert and oriented; motor function intact in all extremities and symmetric; no facial droop Skin: Warm and dry; excoriated lesions of the abdomen and arms which could represent scratched insect bites; maculopapular pustular rash of ankles and feet Psychiatric: Normal mood and affect    ED Course  Procedures (including critical  care time)  MDM  We'll treat with topical mupirocin. The excoriated lesions over trunk and arms are not consistent with scabies or bug bites but could represent self-inflicted injury by scratching.    Wynetta Fines, MD 11/04/13 0230

## 2013-11-29 IMAGING — US US RENAL
1 series · 14 of 25 positions shown · non-contrast
Comparison: None.

CLINICAL DATA: Renal insufficiency.  Right flank pain.

RENAL/URINARY TRACT ULTRASOUND COMPLETE

[Series 1: us renal · 0.30mm/px · 14 of 27 slices shown]
[im 1/27]
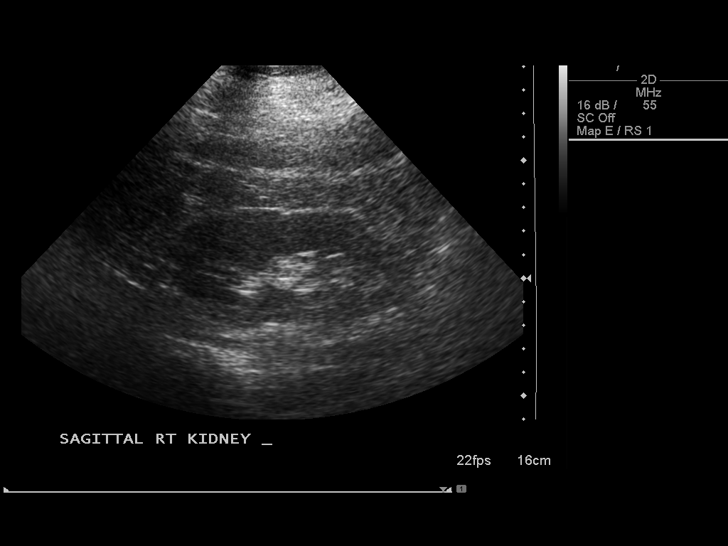
[im 3/27]
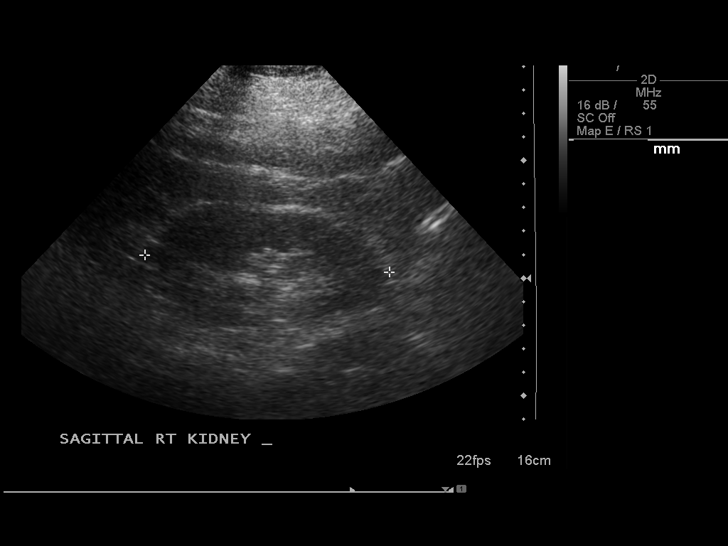
[im 5/27]
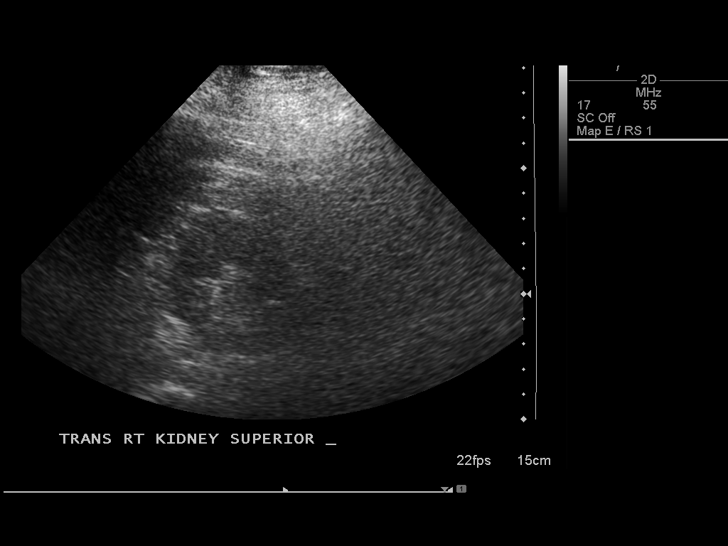
[im 7/27]
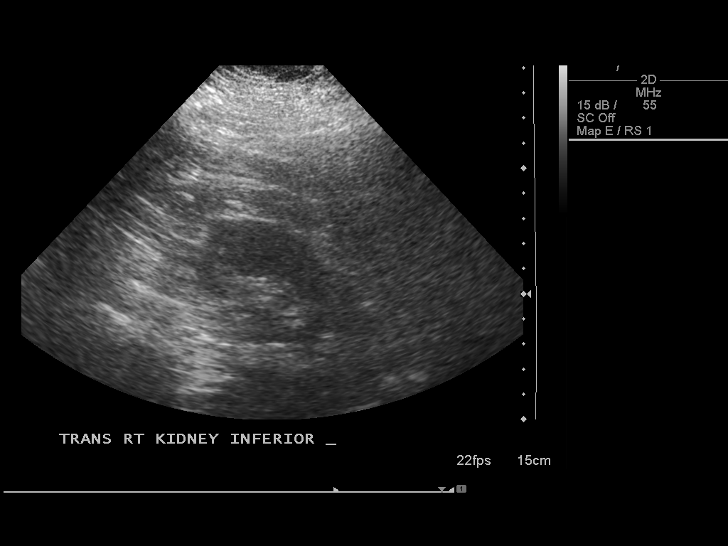
[im 9/27]
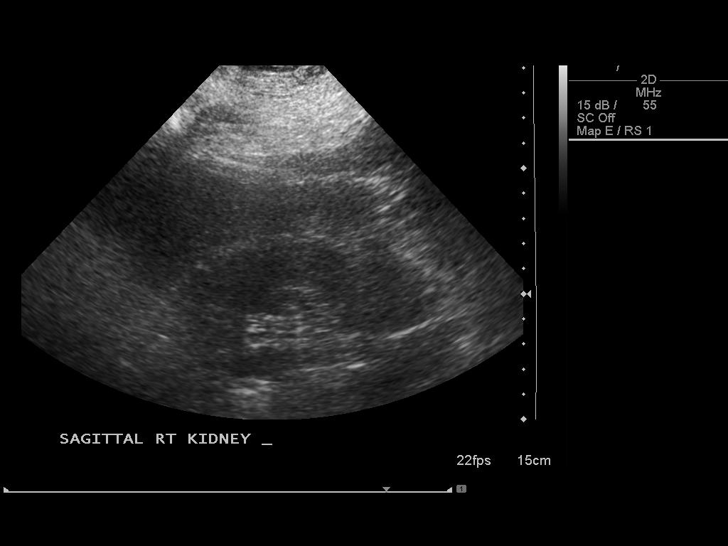
[im 10/27]
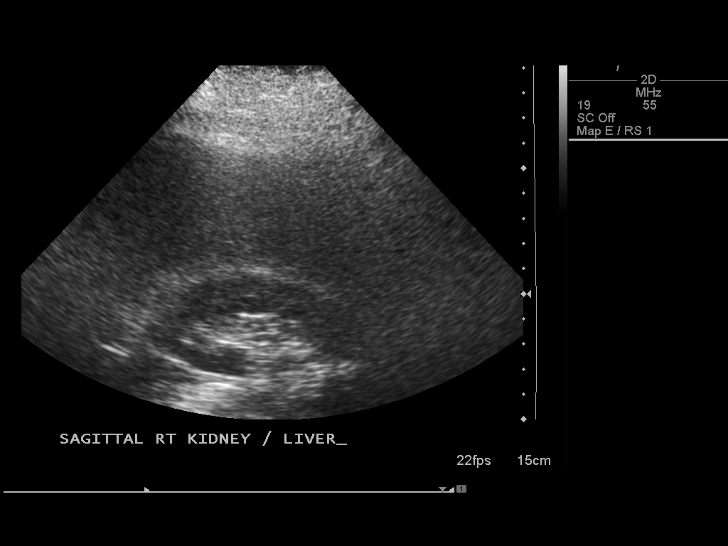
[im 12/27]
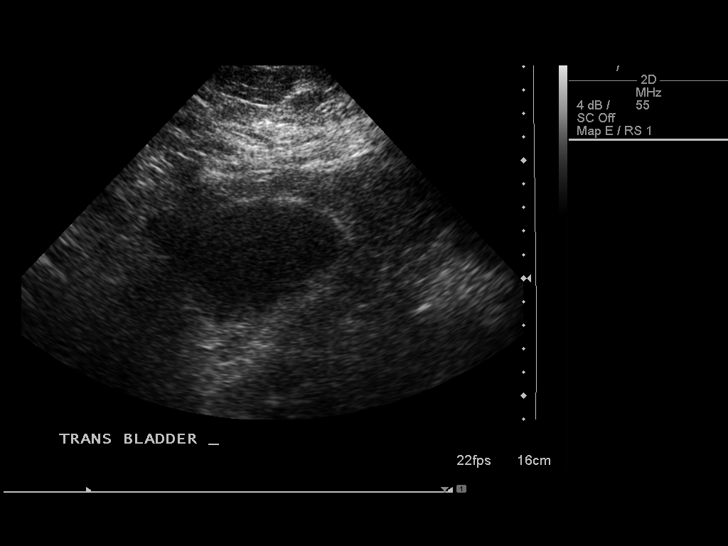
[im 15/27]
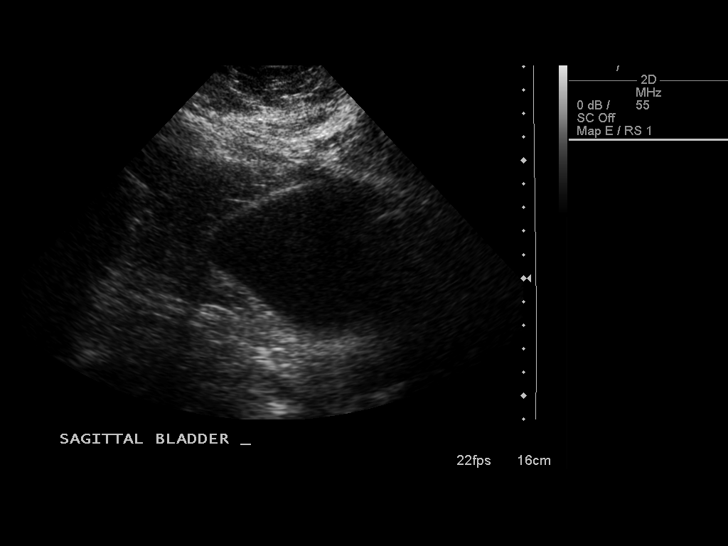
[im 17/27]
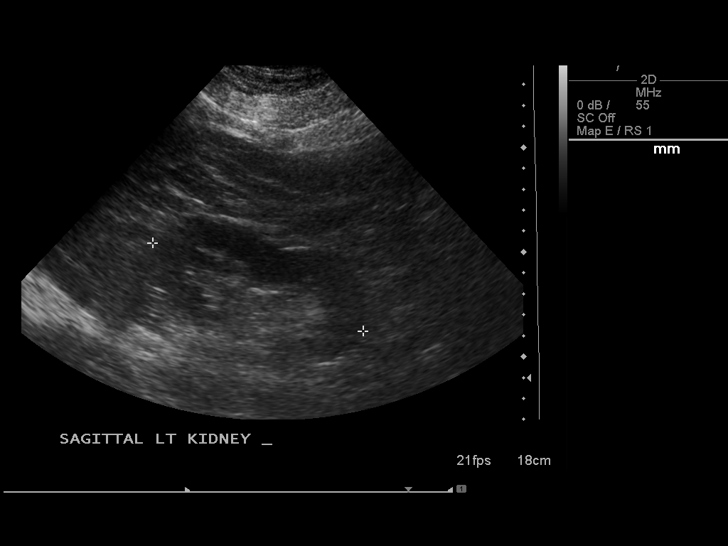
[im 18/27]
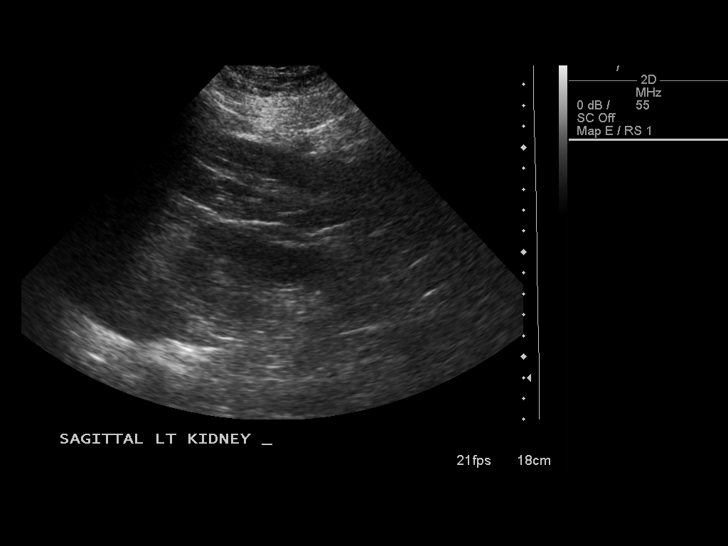
[im 20/27]
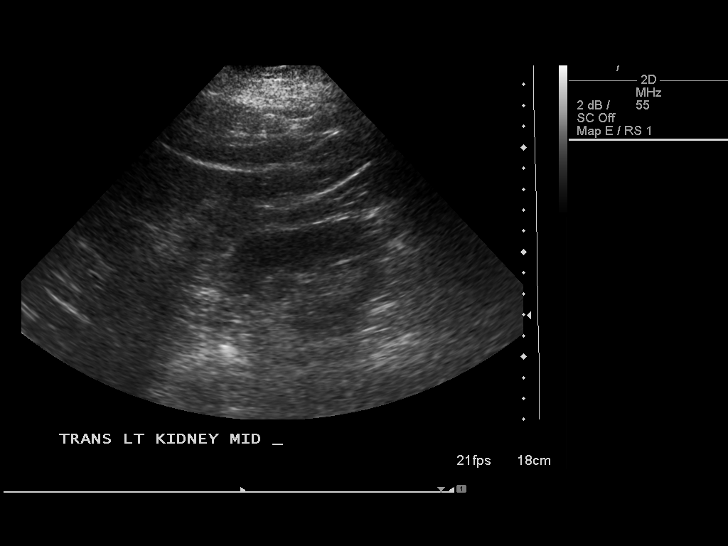
[im 22/27]
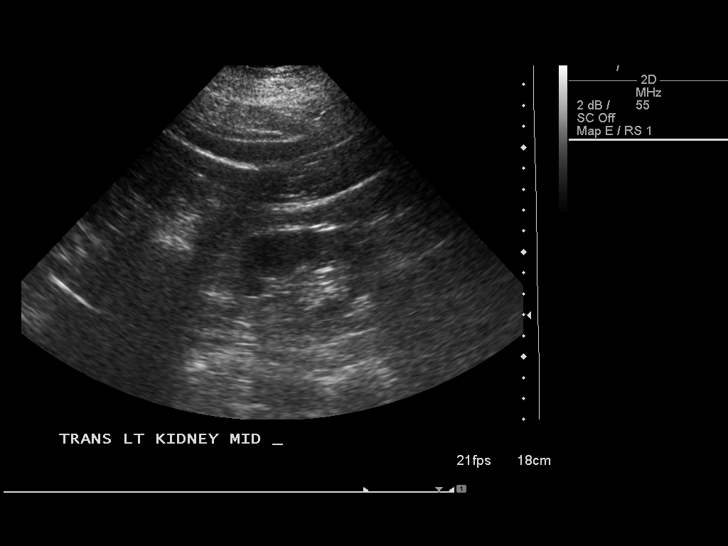
[im 24/27]
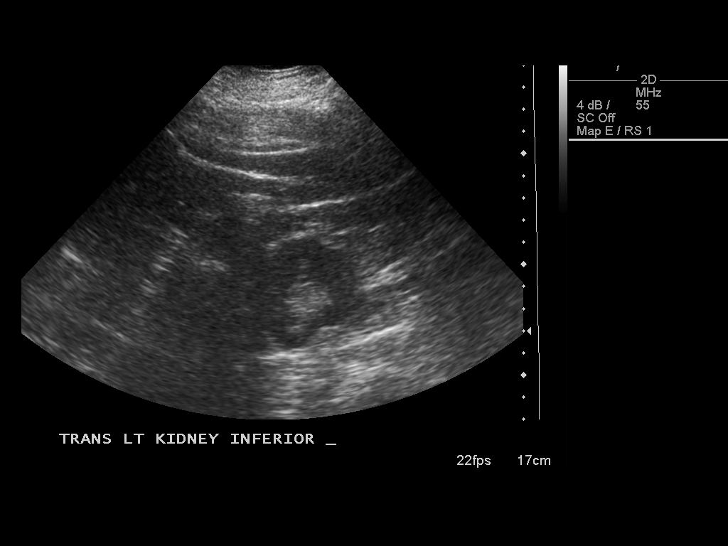
[im 27/27]
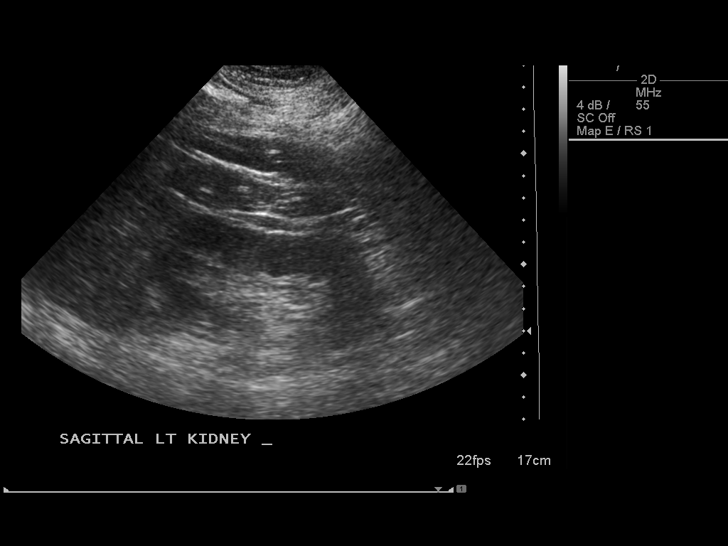

[14 of 25 positions shown; findings below may reference images not displayed]

FINDINGS: Right Kidney:  Normal, measuring 10.4 cm in length.  No
hydronephrosis.

Left Kidney:  Normal, measuring 10.9 cm in length.  No
hydronephrosis.

Bladder:  Normal.
IMPRESSION: Normal examination.

## 2013-11-29 IMAGING — CR DG CHEST 2V
2 series · 2 of 2 positions shown · non-contrast
Comparison: Chest radiograph performed 01/13/2011, and CTA of the
chest performed 01/15/2011

CLINICAL DATA: Cough and shortness of breath; hypotension.

CHEST - 2 VIEW

[w chest lat]
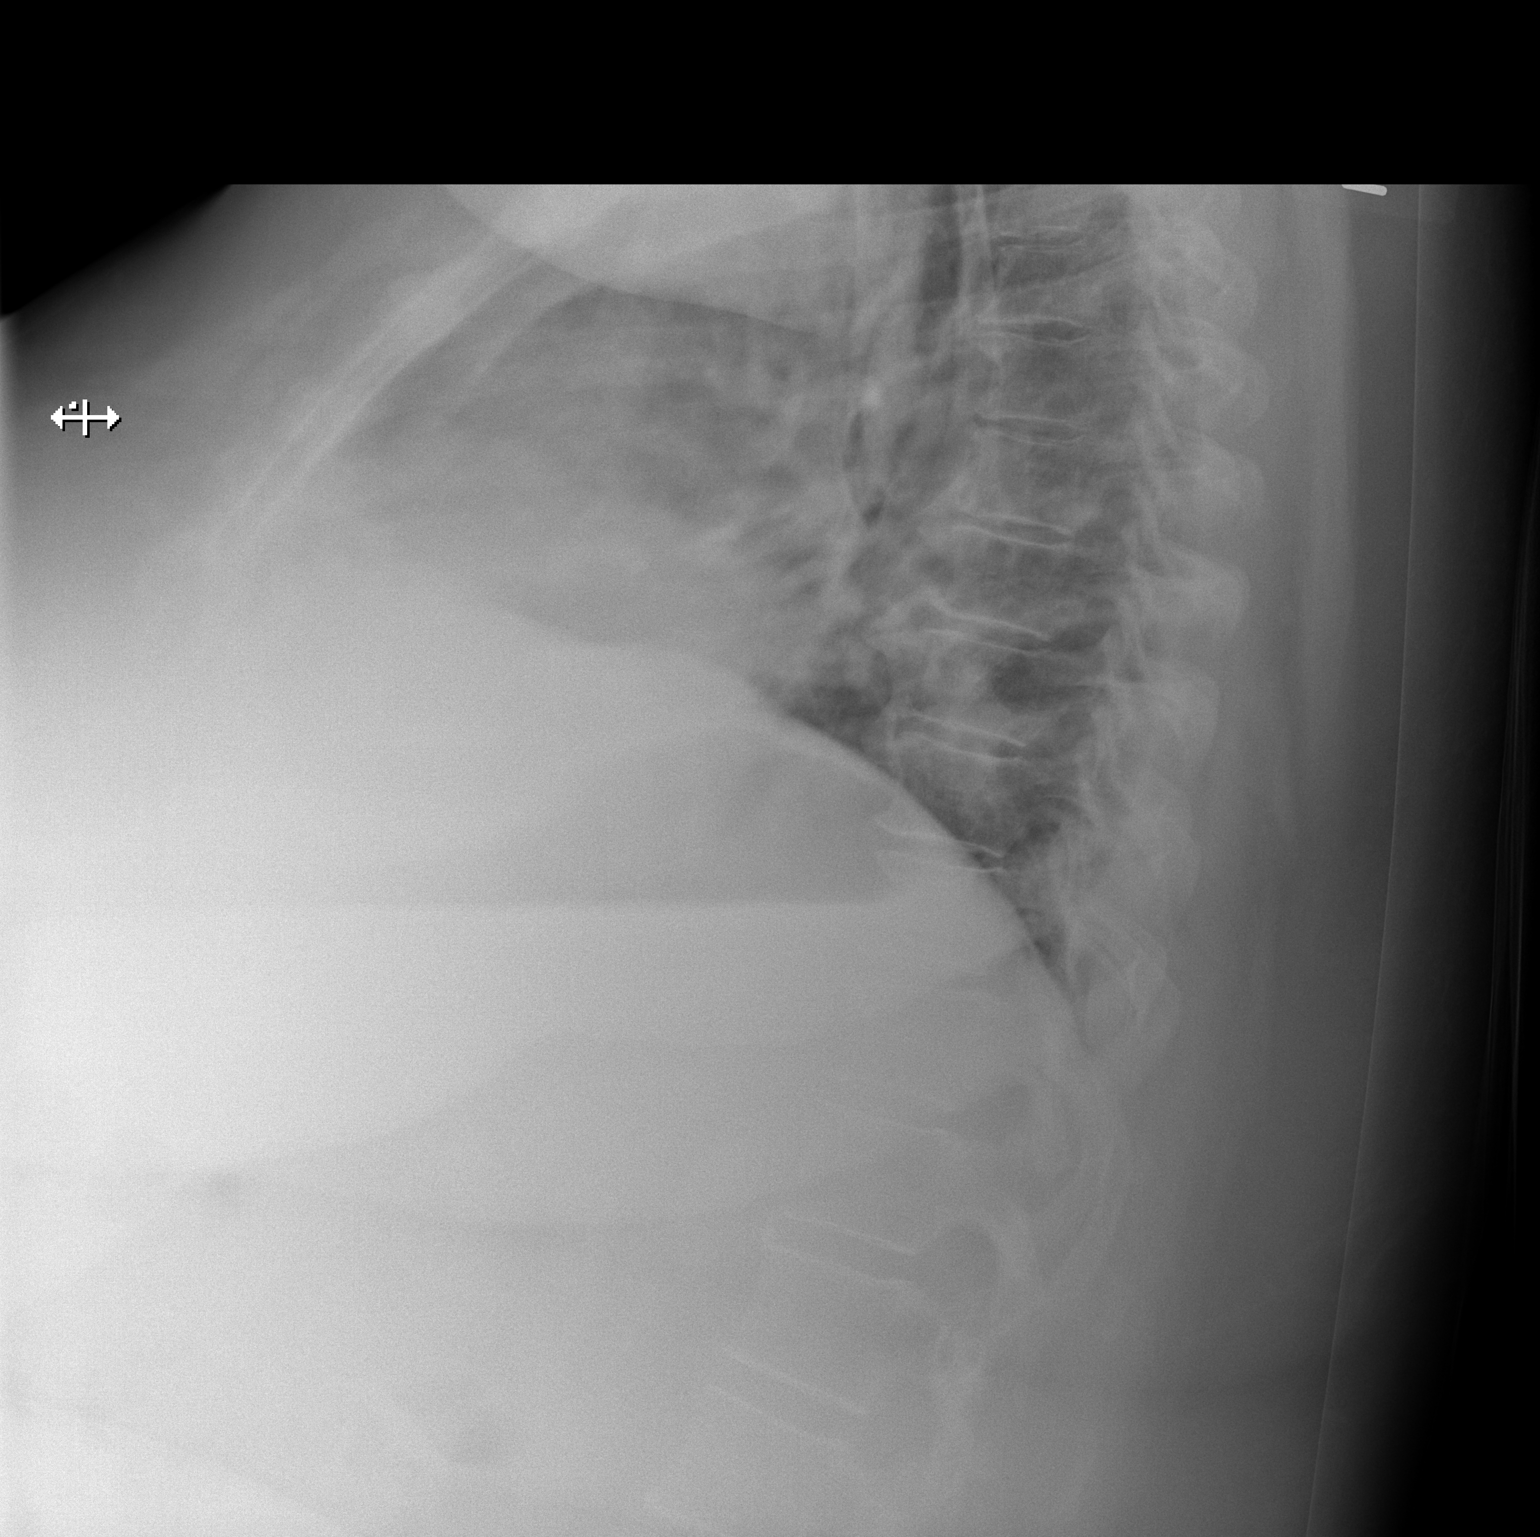

[x chest ap]
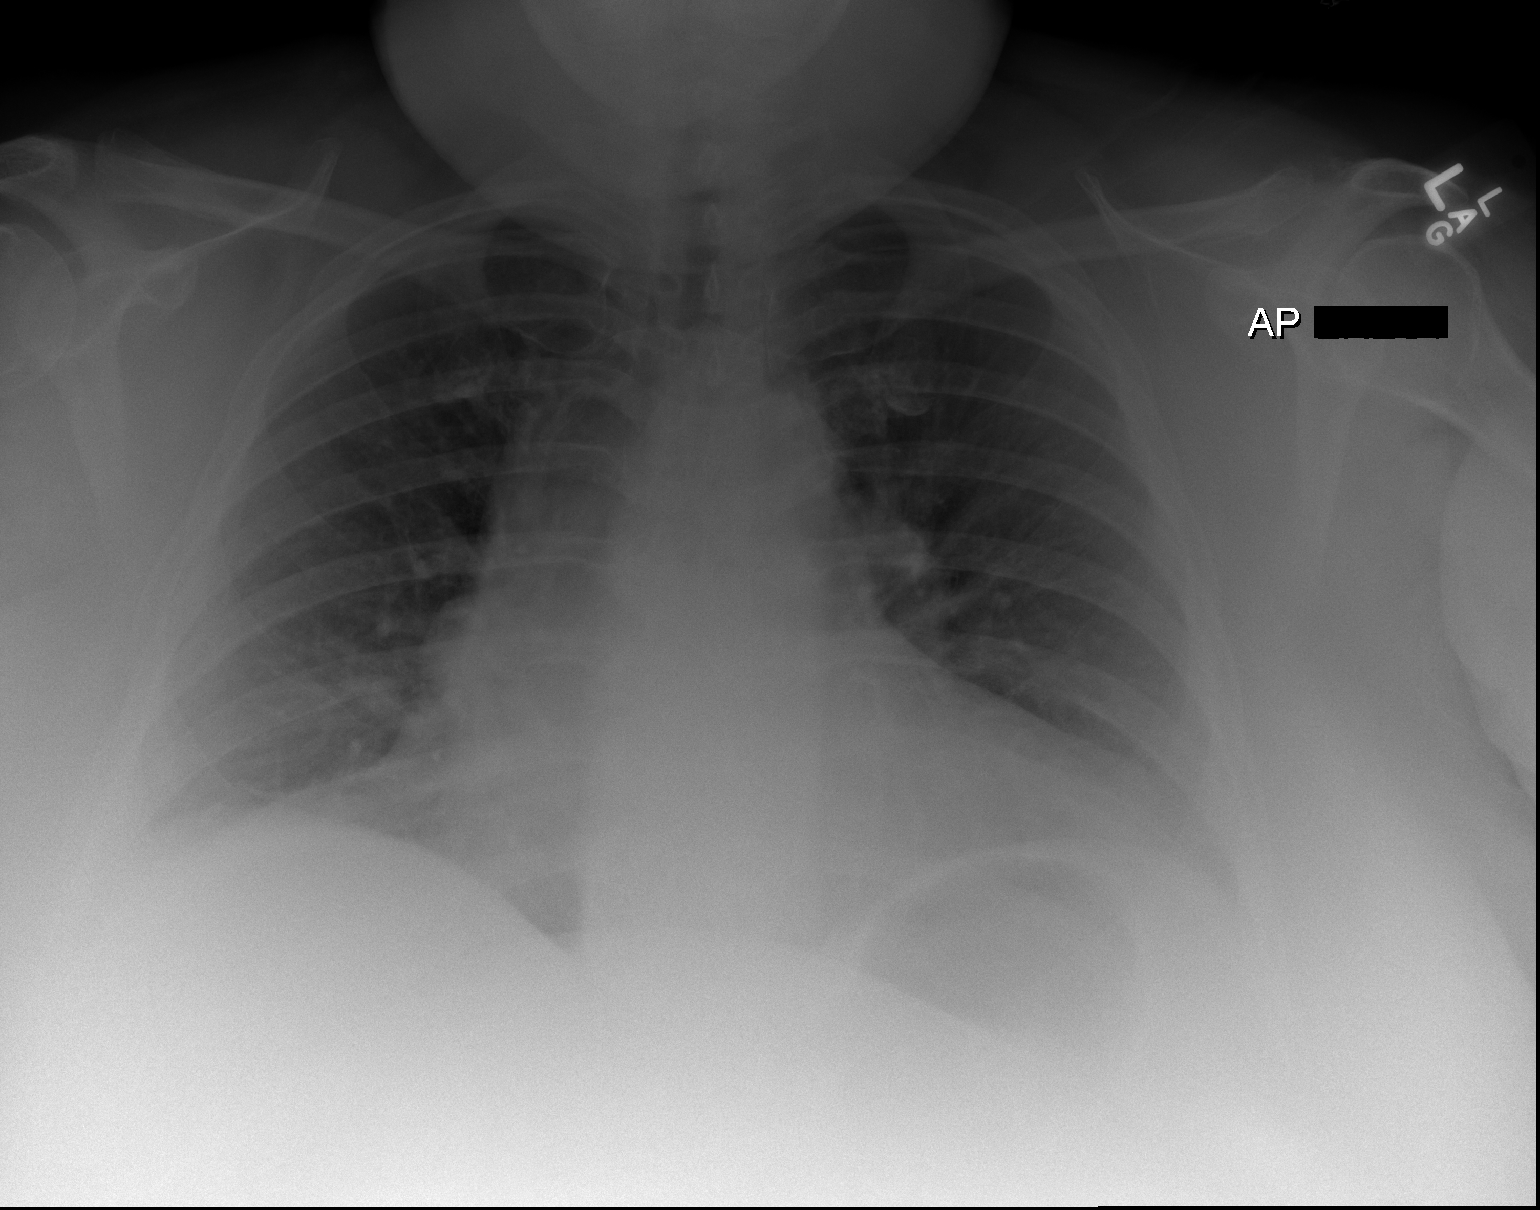

[2 of 2 positions shown; findings below may reference images not displayed]

FINDINGS: The lungs are well-aerated.  Mild vascular congestion is
noted, without significant pulmonary edema.  Mild right lower lobe
opacity is suggested, on correlation with the lateral view;
pneumonia cannot be excluded.  There is no evidence of pleural
effusion or pneumothorax.

The heart is enlarged; the mediastinal contour is within normal
limits.  No acute osseous abnormalities are seen.
IMPRESSION: 1.  Mild right lower lobe airspace opacity is suggested, on
correlation with the lateral view; this could reflect mild
pneumonia.
2.  Mild vascular congestion and cardiomegaly noted, without
significant edema.

## 2013-12-02 IMAGING — CR DG CHEST 2V
2 series · 2 of 2 positions shown · non-contrast
Comparison: 05/13/2011

CLINICAL DATA: Evaluate pneumonia

CHEST - 2 VIEW

[w chest lat]
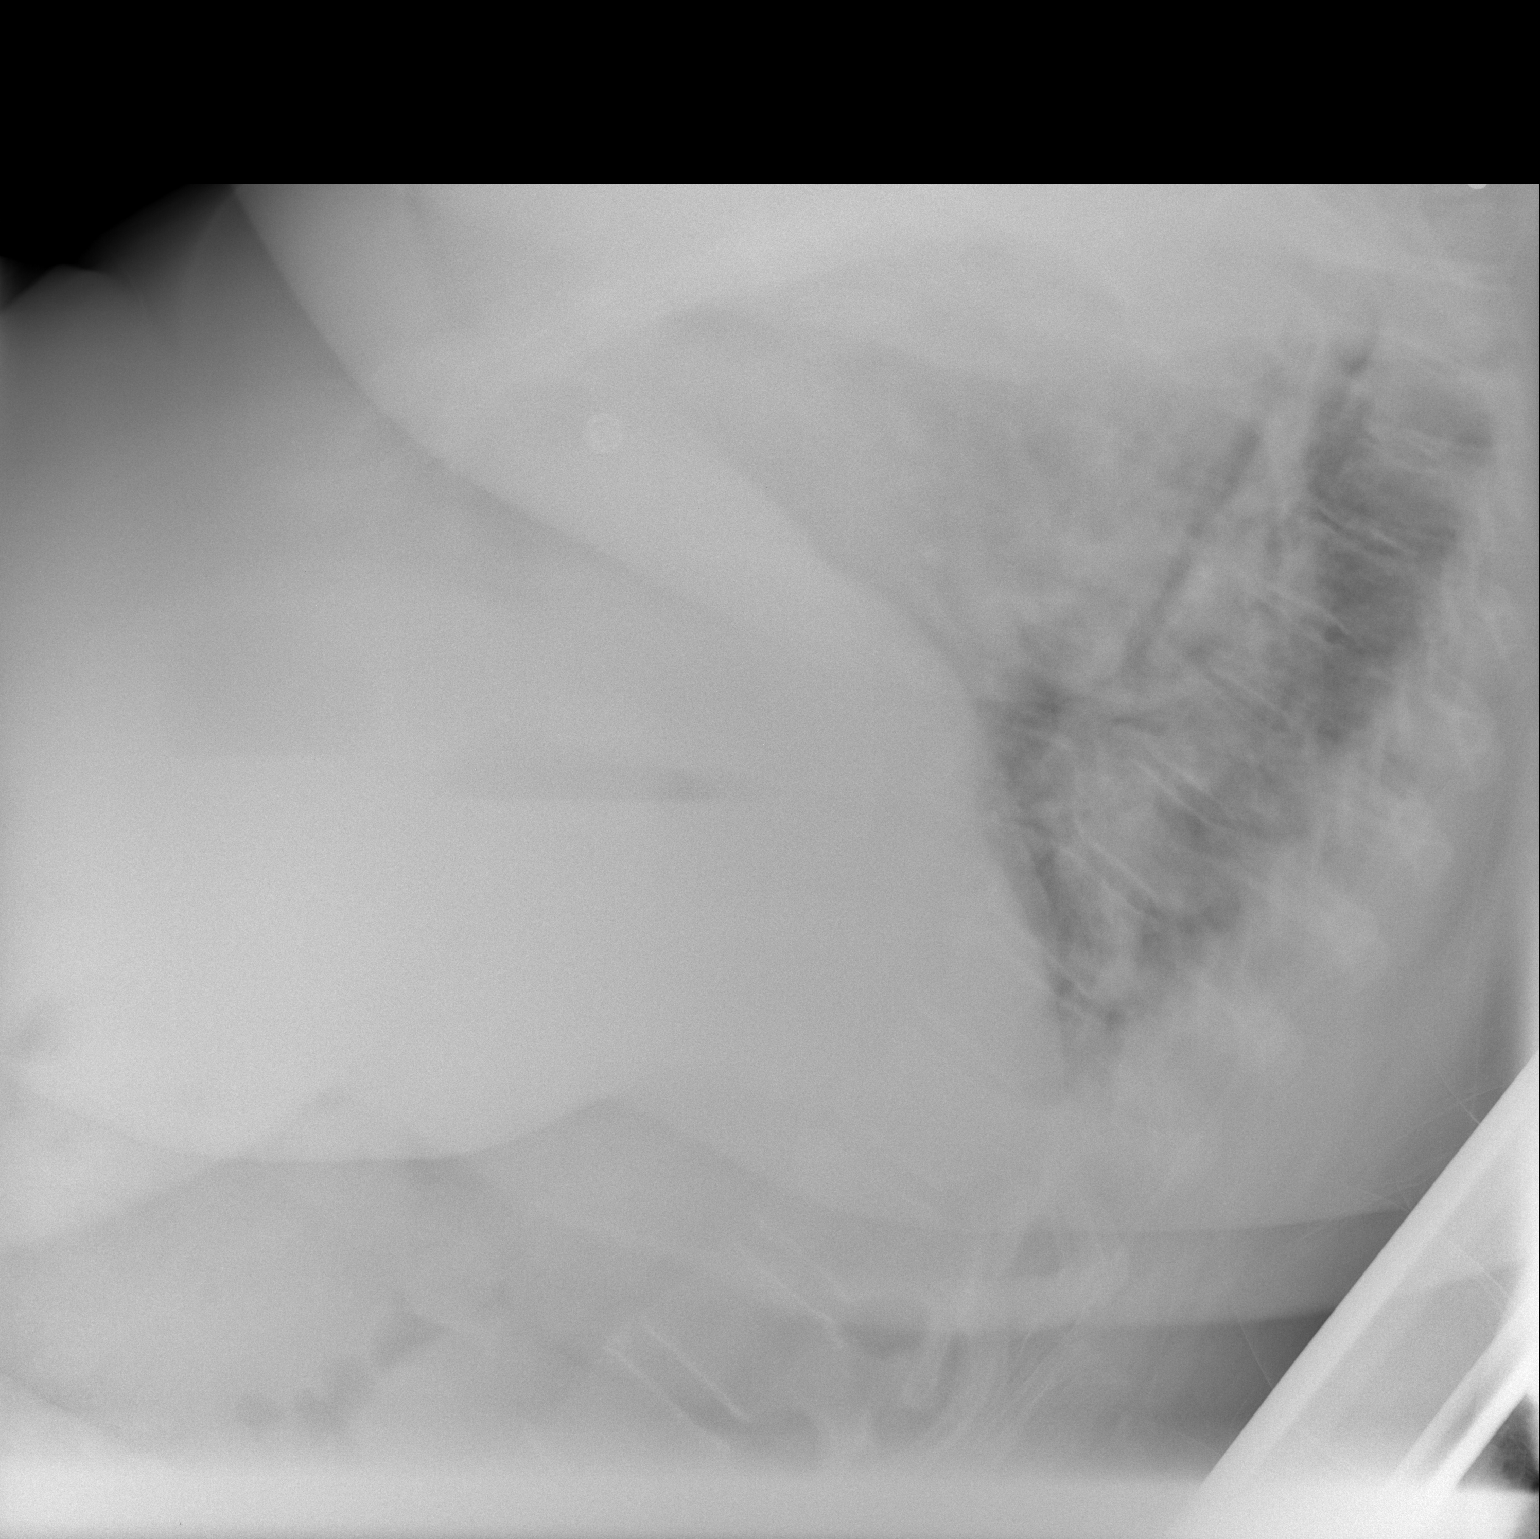

[view not recorded]
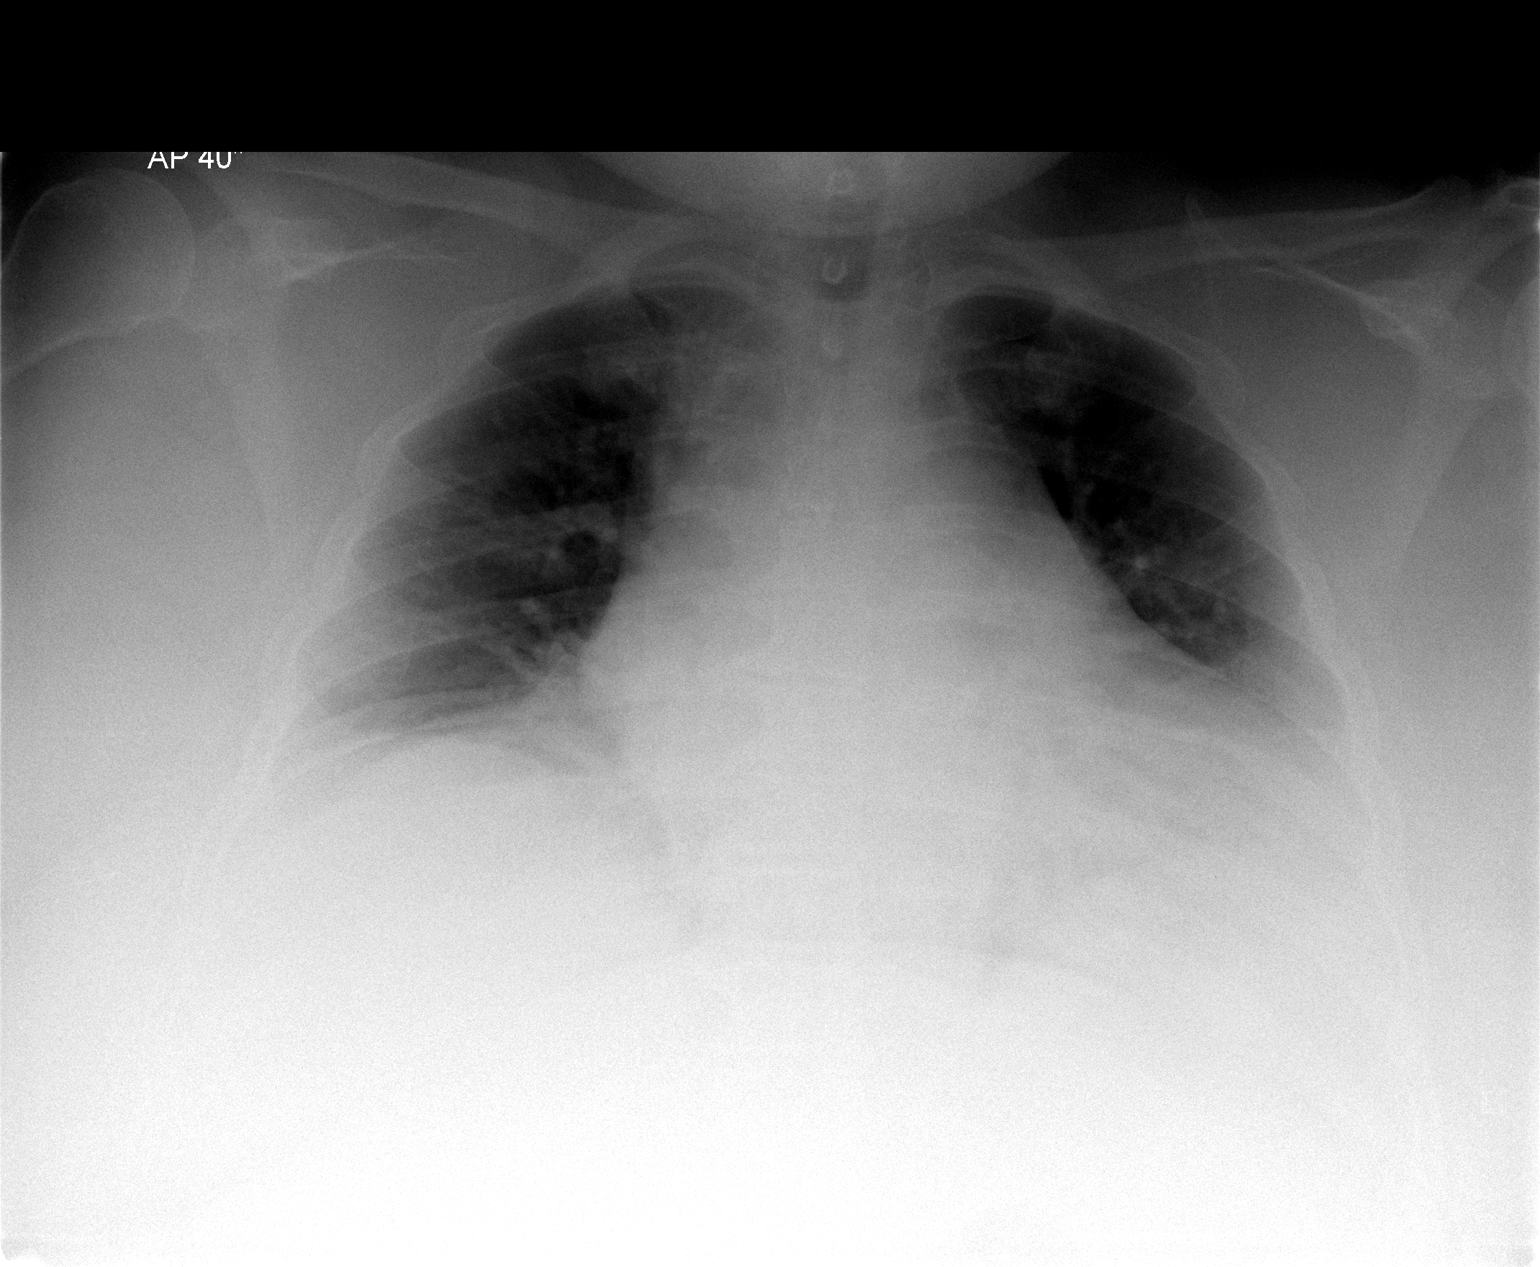

[2 of 2 positions shown; findings below may reference images not displayed]

FINDINGS: Heart size appears enlarged.

The lung volumes are low and there is asymmetric elevation of the
right hemidiaphragm.

Atelectasis is noted within the lung bases.
IMPRESSION: 1.  Low lung volumes and bibasilar atelectasis.

## 2013-12-02 IMAGING — CT CT PELVIS W/O CM
3 of 4 series · 16 of 33 positions shown, 19 images · non-contrast
Comparison: Two-view abdomen x-ray 06/14/2006.  No prior pelvic
imaging.

CLINICAL DATA: Pelvic pain.  Morbid obesity.

CT PELVIS WITHOUT CONTRAST 05/16/2011:
TECHNIQUE: Multidetector CT imaging of the pelvis was performed
following the standard protocol without intravenous contrast.

[Series 13: bone pel/hip 3.0 b40f · axial · 0.74mm/px · z∈[+1017,+1197]mm · 8 of 74 slices shown, 10 images]
[im 7/74  soft-tissue]
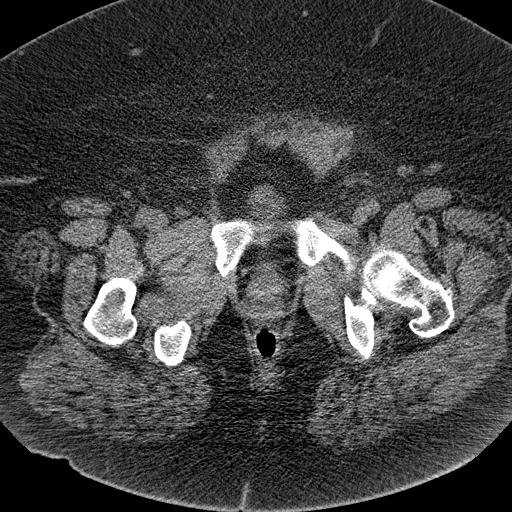
[im 7/74  bone]
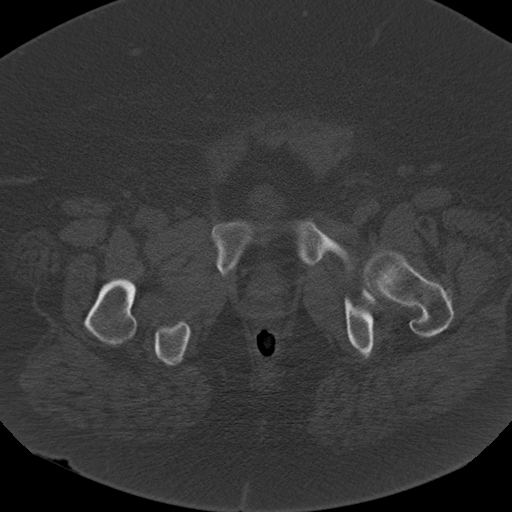
[im 14/74  bone]
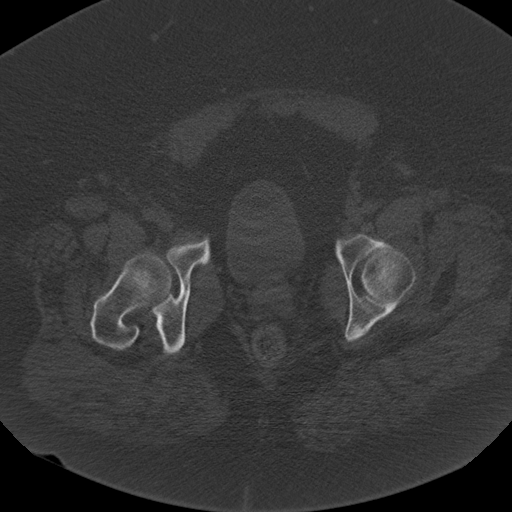
[im 27/74  bone]
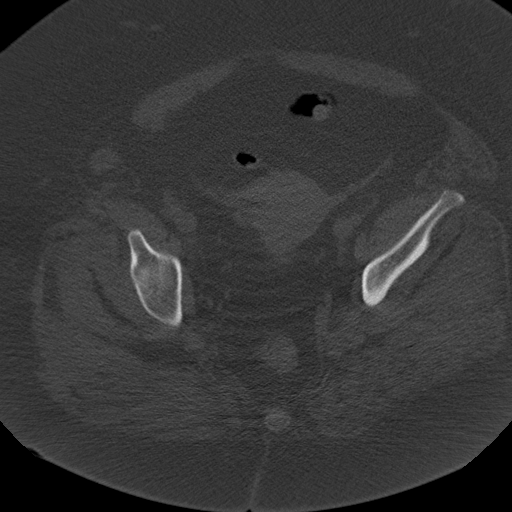
[im 34/74  bone]
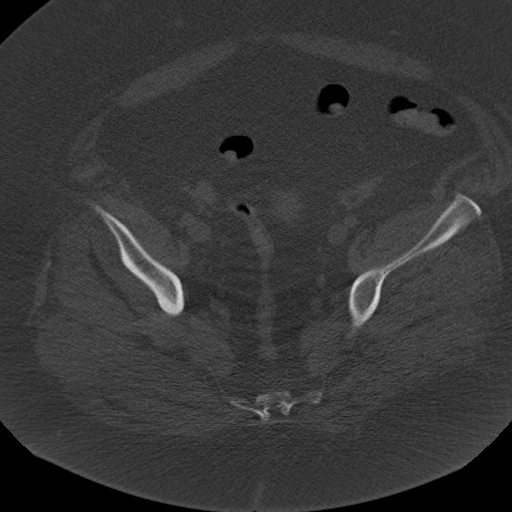
[im 40/74  soft-tissue]
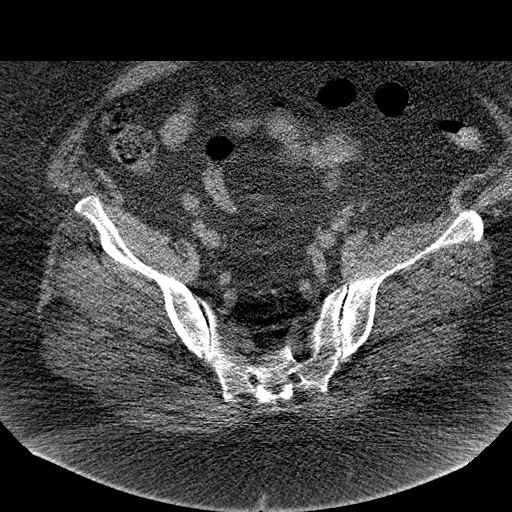
[im 40/74  bone]
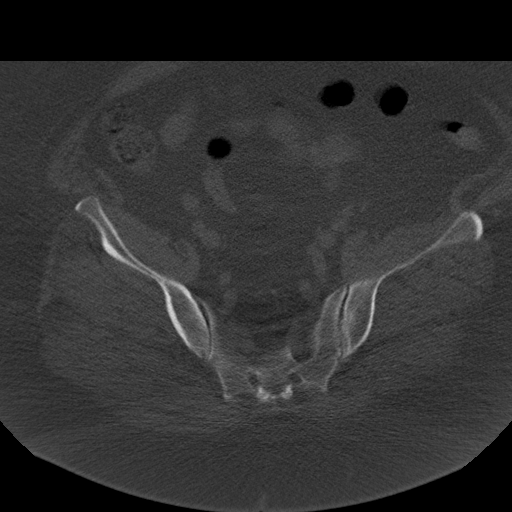
[im 47/74  bone]
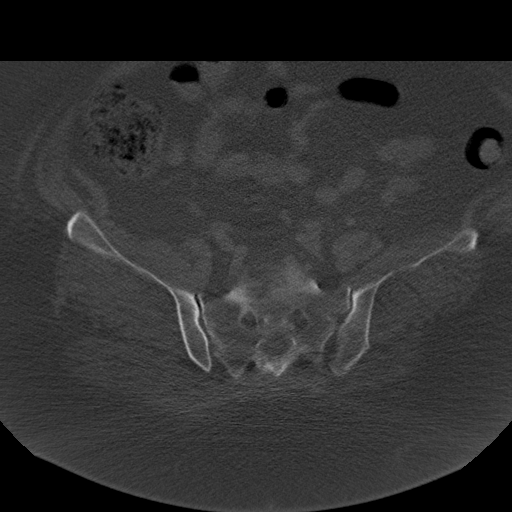
[im 60/74  bone]
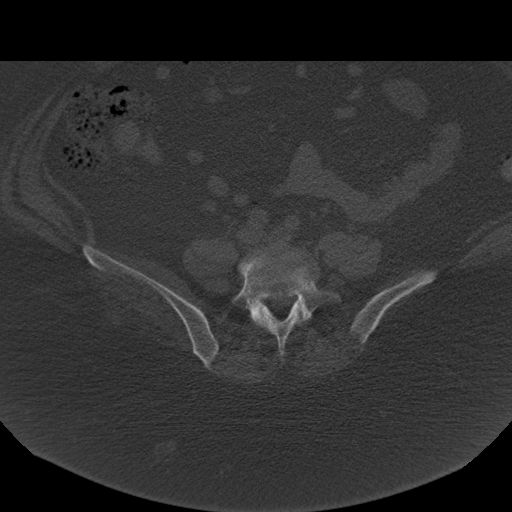
[im 67/74  bone]
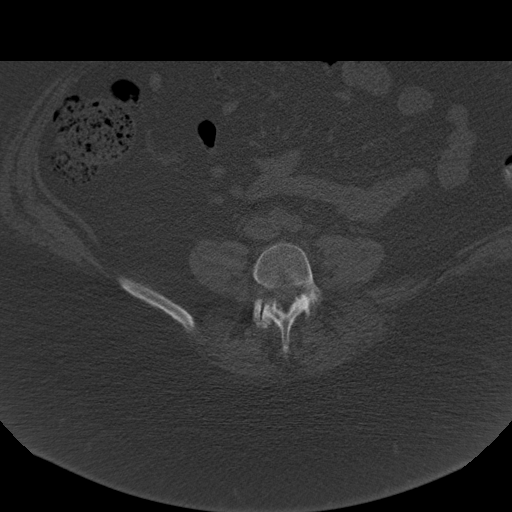

[Series 605: pelvic sag · sagittal · 0.74mm/px · 5 of 172 slices shown, 6 images]
[im 58/172  bone]
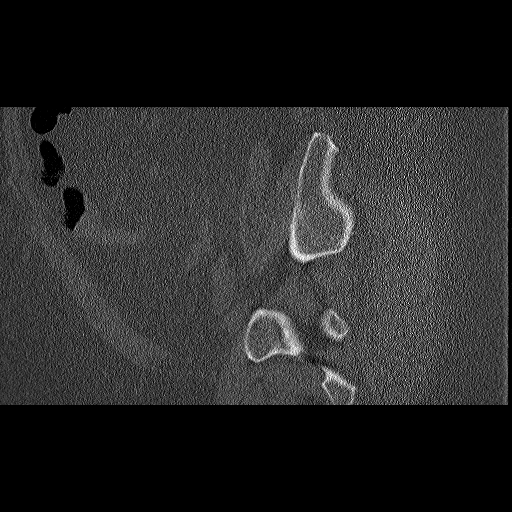
[im 72/172  bone]
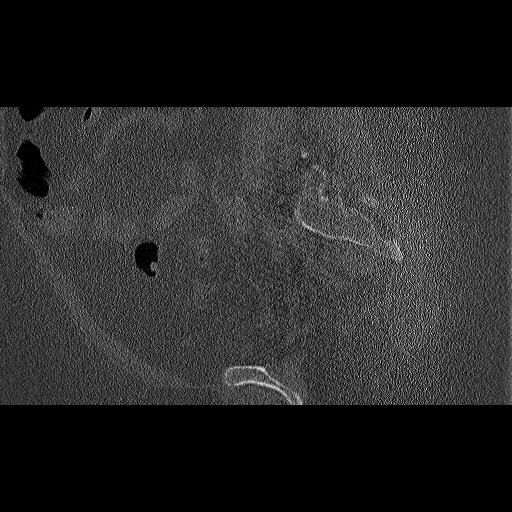
[im 86/172  soft-tissue]
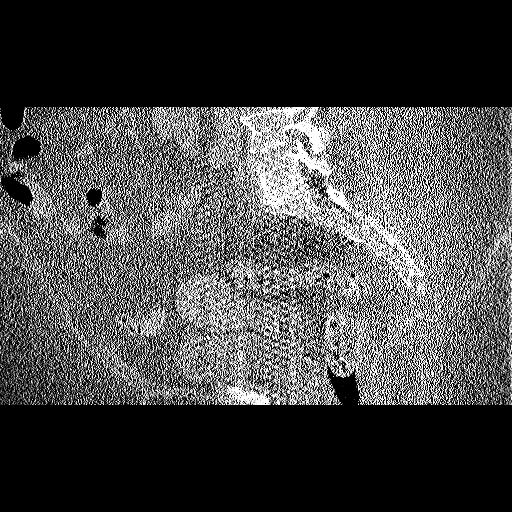
[im 86/172  bone]
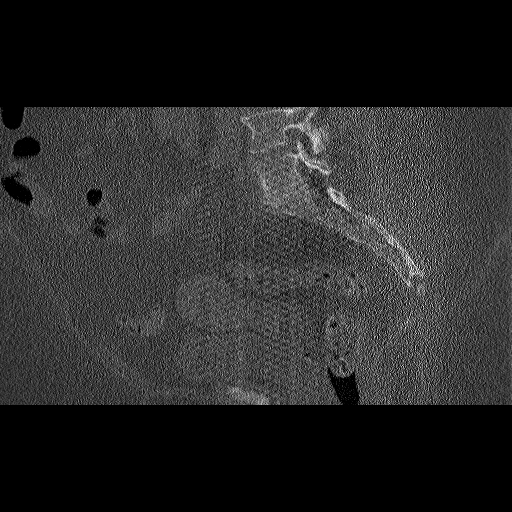
[im 100/172  bone]
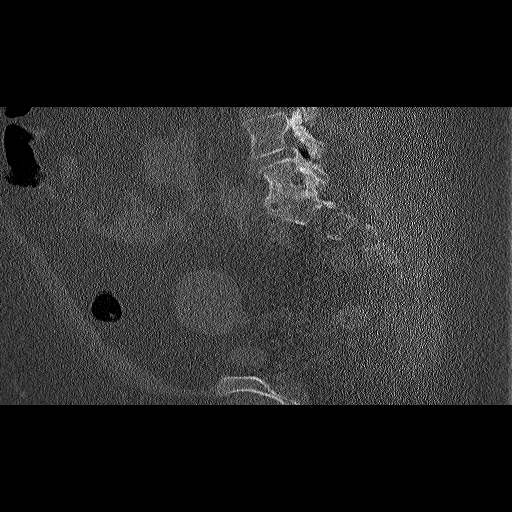
[im 115/172  bone]
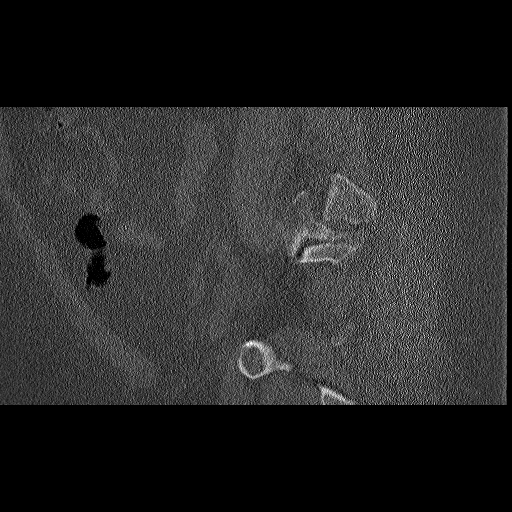

[Series 606: pelvic cor · coronal · 0.74mm/px · 3 of 108 slices shown]
[im 22/108  bone]
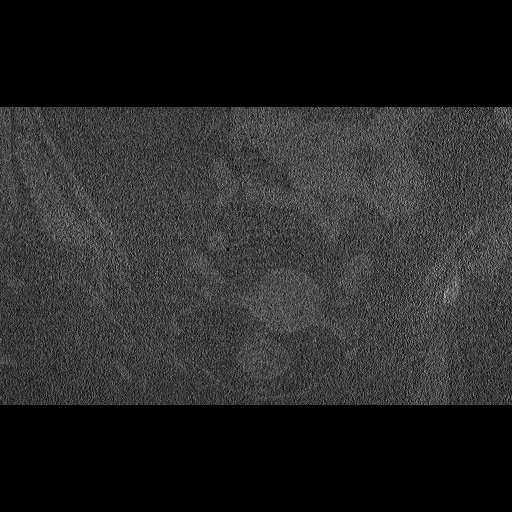
[im 43/108  bone]
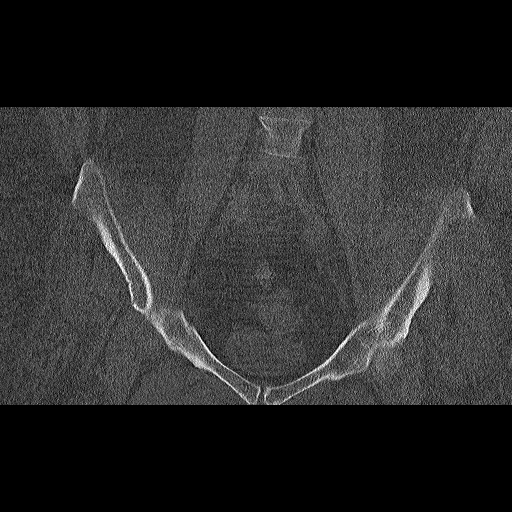
[im 65/108  bone]
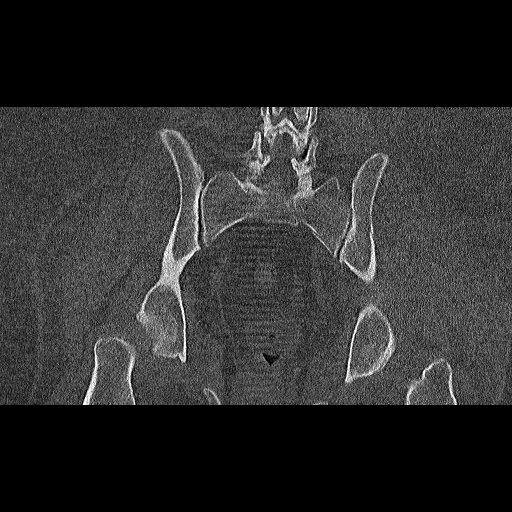

[16 of 33 positions shown; findings below may reference images not displayed]

FINDINGS: No fractures identified involving the bony pelvis.
Degenerative changes involving the L5-S1 facet joints and severe
disc space narrowing and endplate hypertrophic changes at L5-S1.
Mild degenerative changes involving the sacroiliac joints.  Hip
joints intact.

Soft tissue window images demonstrate no abnormalities involving
the anatomic pelvis.
IMPRESSION: 1.  No fractures involving the pelvis.
2.  Mild degenerative changes in the sacroiliac joints.
3.  Degenerative disc disease and spondylosis at L5-S1.
Degenerative changes in the facet joints at L5-S1.

## 2013-12-02 IMAGING — CT CT L SPINE W/O CM
5 of 7 series · 14 of 33 positions shown, 16 images · non-contrast
Comparison: Abdominal radiographs 06/14/2006.

CLINICAL DATA: 46-year-old female with low back pain radiating down
the left lower extremity.

CT LUMBAR SPINE WITHOUT CONTRAST
TECHNIQUE: Multidetector CT imaging of the lumbar spine was
performed without intravenous contrast administration. Multiplanar
CT image reconstructions were also generated.

[Series 4: l-spine xxl 3.0 b30s · axial · 0.39mm/px · z∈[+1164,+1238]mm · 2 of 77 slices shown, 3 images]
[im 26/77  soft-tissue]
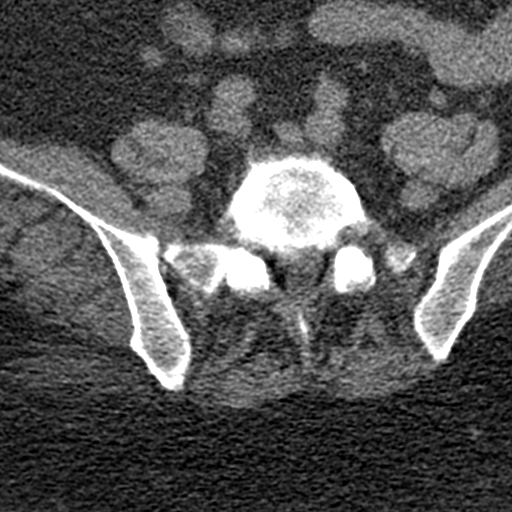
[im 26/77  bone]
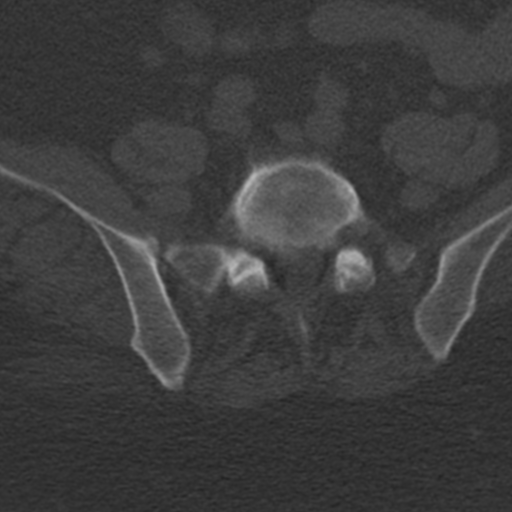
[im 51/77  bone]
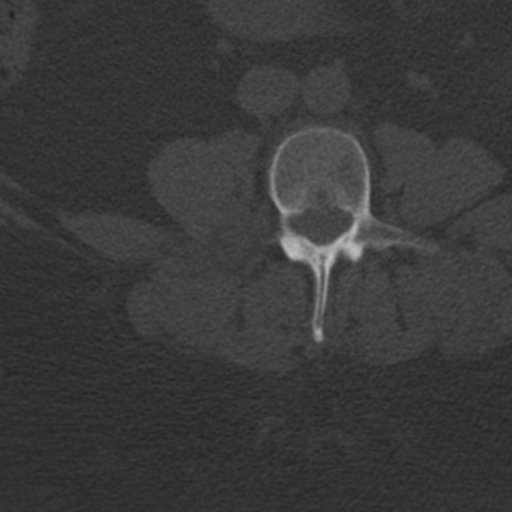

[Series 13: bone pel/hip 3.0 b40f · axial · 0.74mm/px · z∈[+1071,+1143]mm · 2 of 74 slices shown]
[im 25/74  bone]
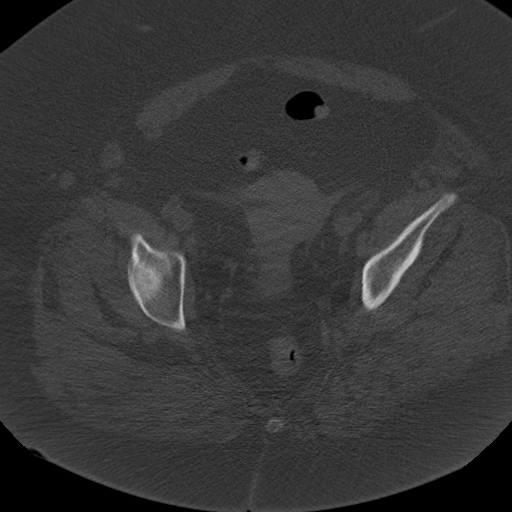
[im 49/74  bone]
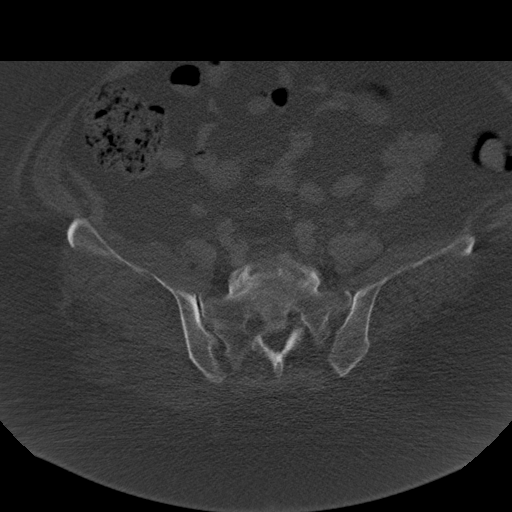

[Series 602: lspine cor · coronal · 0.45mm/px · 3 of 58 slices shown]
[im 12/58  bone]
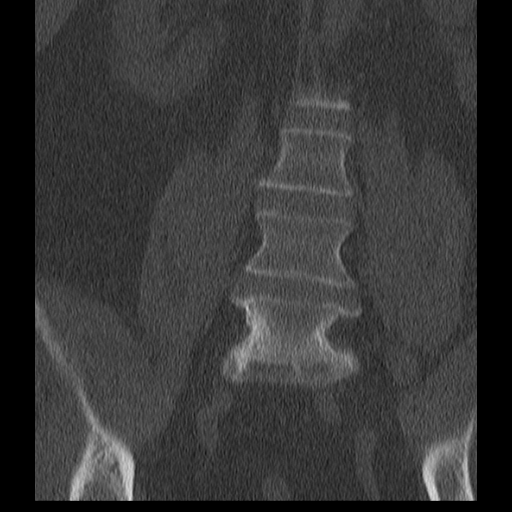
[im 23/58  bone]
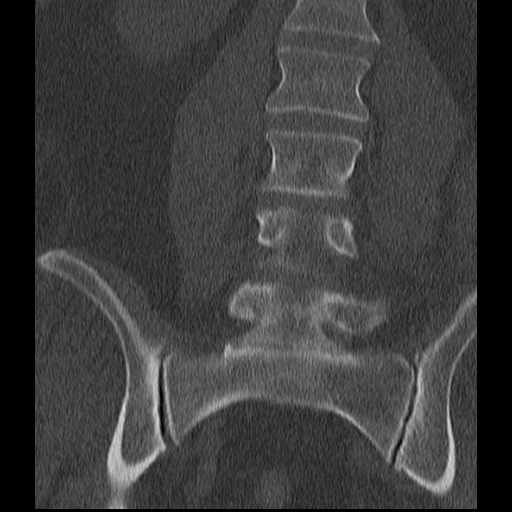
[im 35/58  bone]
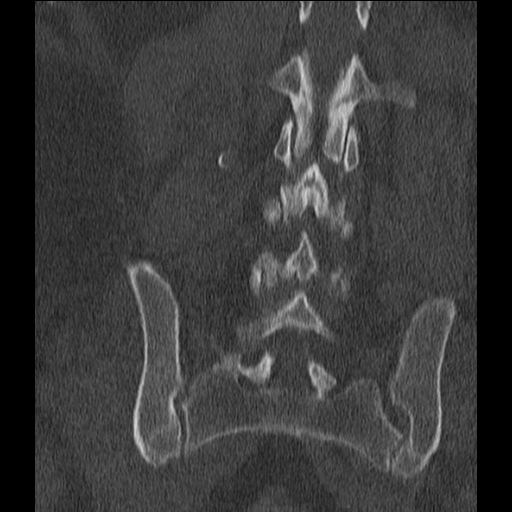

[Series 603: axial · axial · 0.45mm/px · z∈[+1165,+1211]mm · 2 of 70 slices shown]
[im 24/70  bone]
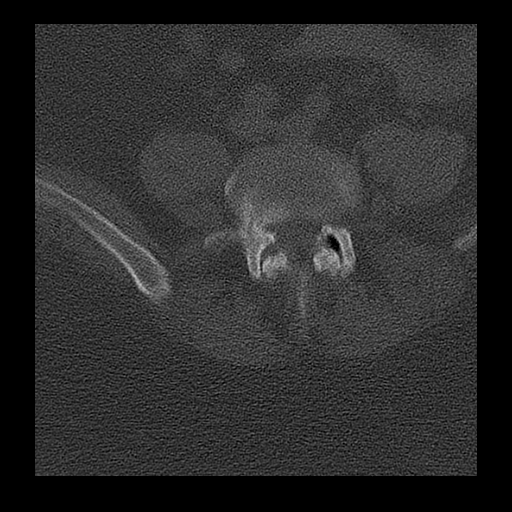
[im 47/70  bone]
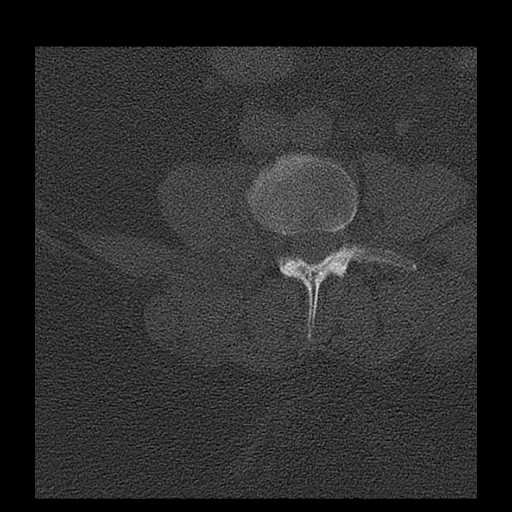

[Series 604: sag · sagittal · 0.45mm/px · 5 of 52 slices shown, 6 images]
[im 18/52  bone]
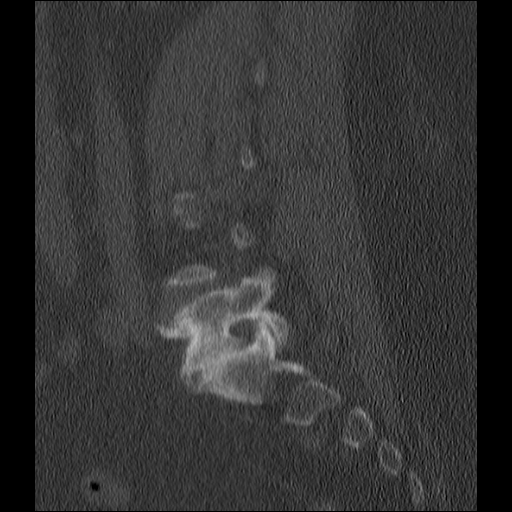
[im 22/52  bone]
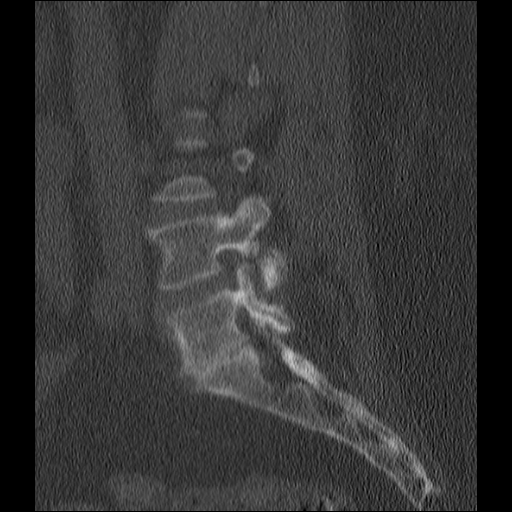
[im 26/52  soft-tissue]
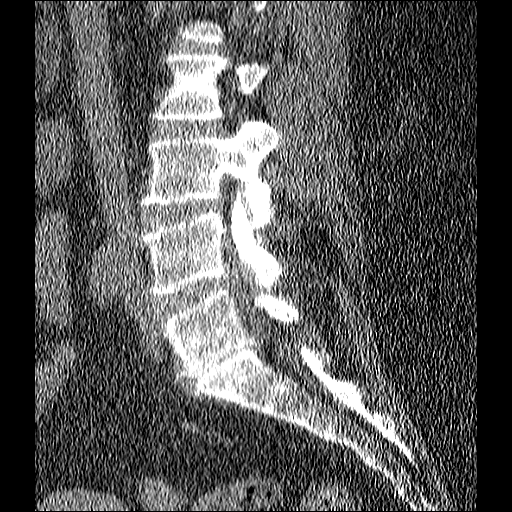
[im 26/52  bone]
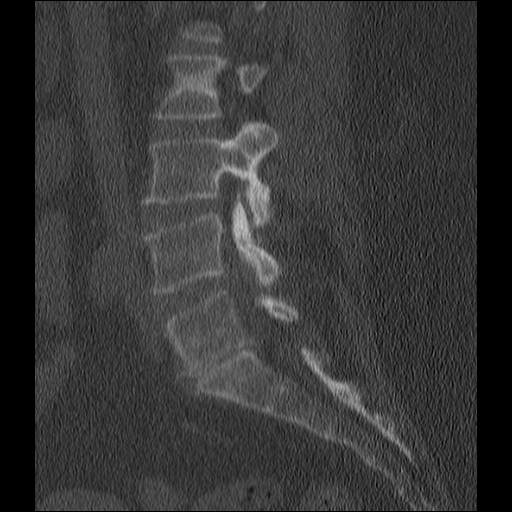
[im 30/52  bone]
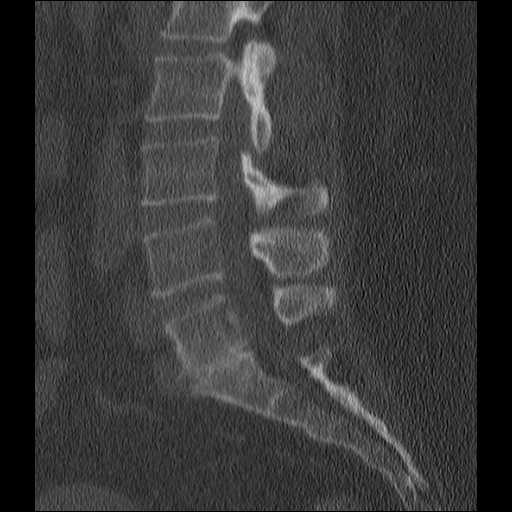
[im 35/52  bone]
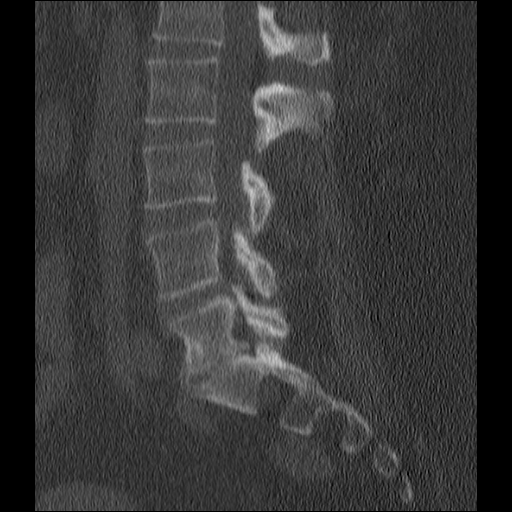

[14 of 33 positions shown; findings below may reference images not displayed]

FINDINGS: Large body habitus.  An negative visualized abdominal and
pelvic viscera.  Normal lumbar segmentation. Bone mineralization is
within normal limits.  Visualized sacrum appears intact.  Negative
SI joints except for degenerative vacuum phenomena.

Lumbar vertebral height and alignment is within normal limits.  The
superior L1 vertebral body is not included on the sagittal
reformatted images.  No acute osseous abnormality in the lumbar
spine.  The following degenerative changes are noted:

L4-L5.  Severe facet hypertrophy.  Vacuum facet phenomenon greater
on the left.  Mild circumferential disc bulge.  No significant
spinal stenosis.  Mild to moderate bilateral L4 foraminal stenosis.

L5-S1:  Severe disc space loss.  Circumferential disc osteophyte
complex and endplate sclerosis.  No spinal stenosis.  Mild to
moderate bilateral L5 foraminal stenosis, greater on the left.
IMPRESSION: 1. No acute osseous abnormality in the lumbar spine.
2.  Severe L4-L5 facet degeneration contributing to bilateral L4
foraminal stenosis.
3.  Severe L5-L1 disc degeneration contributing to bilateral
foraminal stenosis..

## 2013-12-13 IMAGING — CR DG CHEST 2V
3 series · 3 of 3 positions shown · non-contrast
Comparison: 05/16/2011

CLINICAL DATA: Cough, history asthma, hypertension

CHEST - 2 VIEW

[w chest lat (1 of 2)]
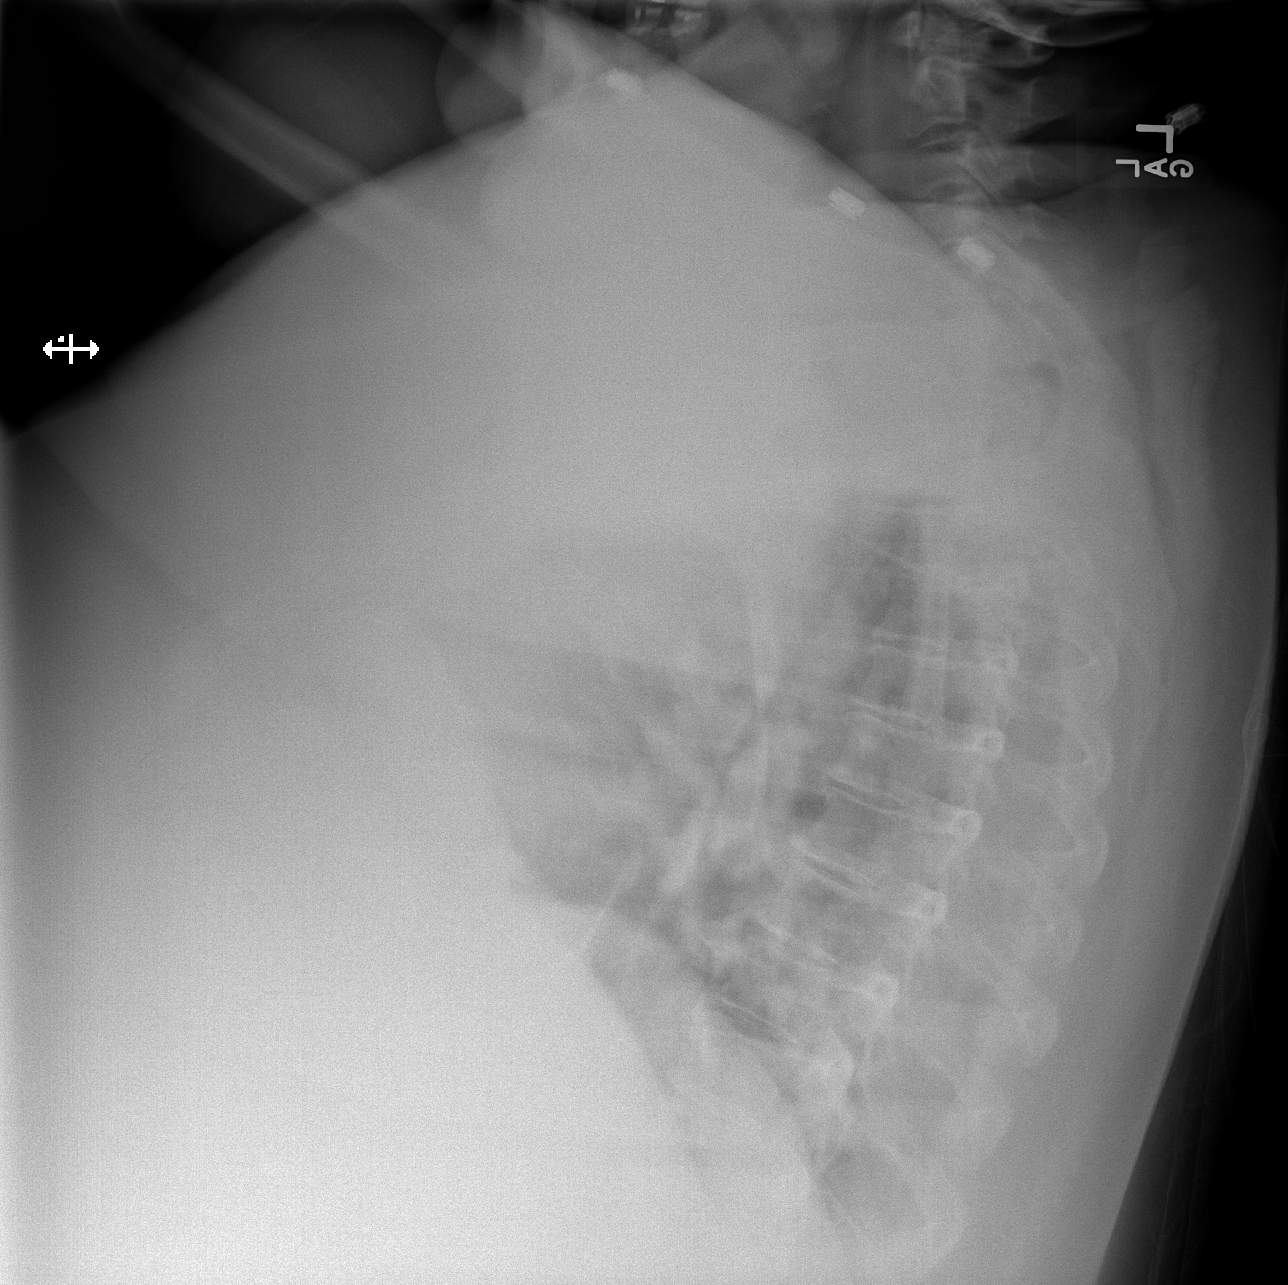

[w chest lat (2 of 2)]
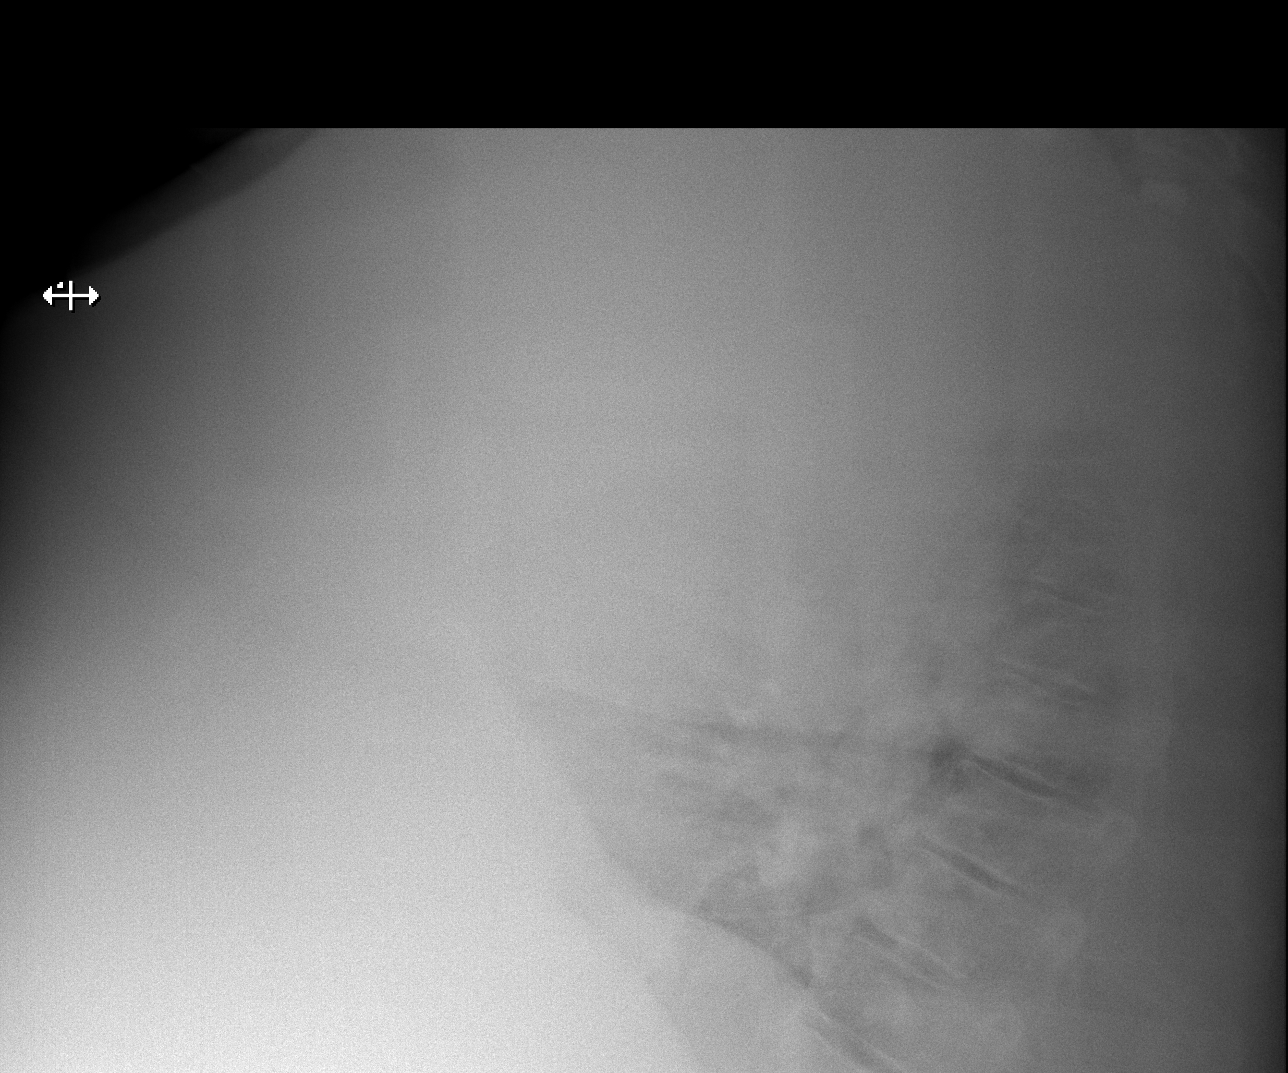

[x chest ap]
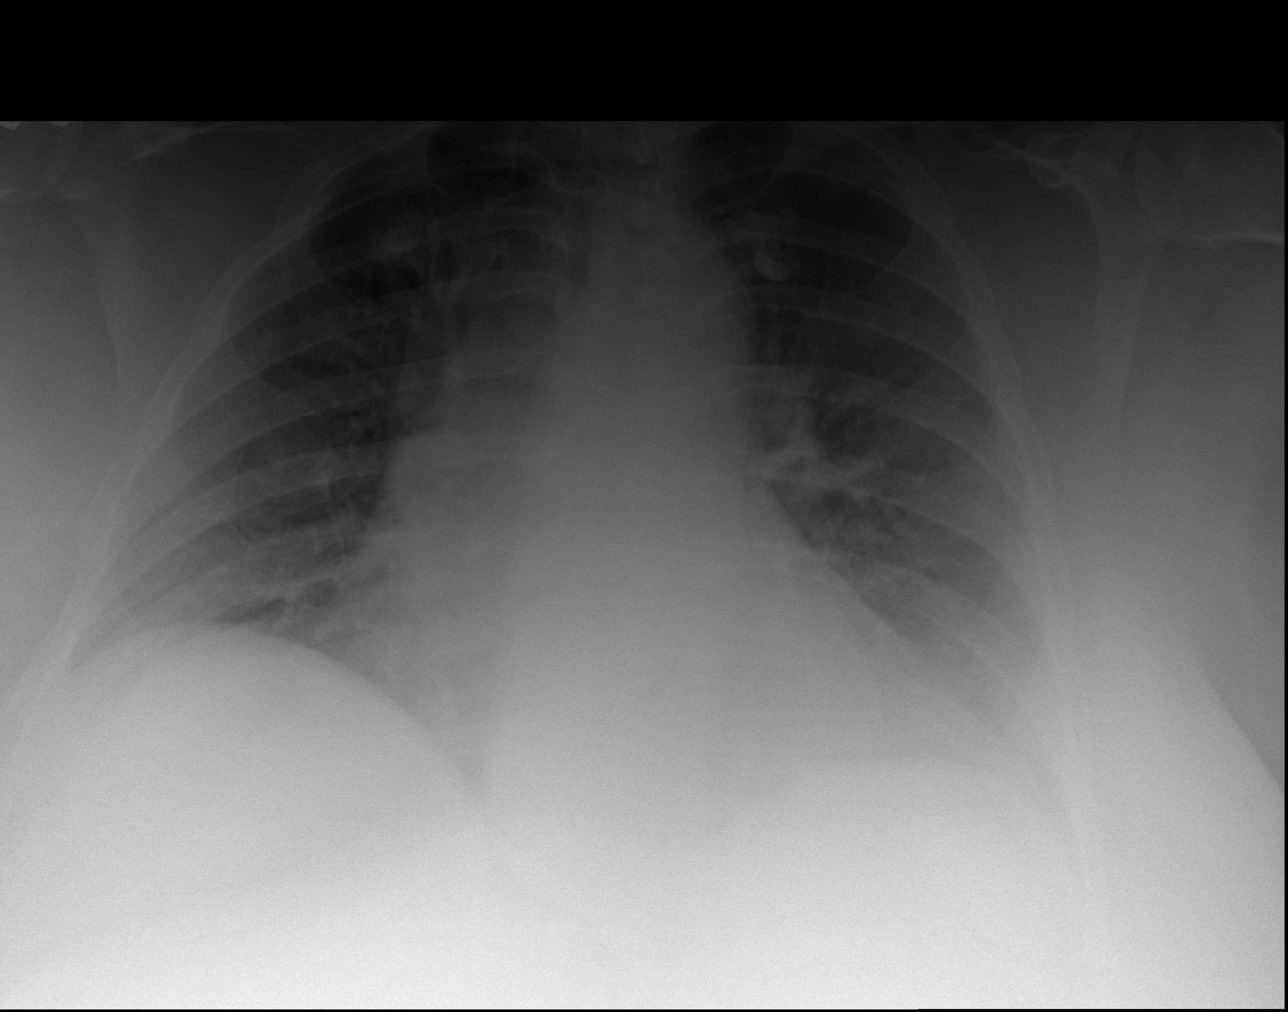

[3 of 3 positions shown; findings below may reference images not displayed]

FINDINGS: Enlargement of cardiac silhouette.
Pulmonary vascular congestion.
Mild bibasilar atelectasis.
No gross infiltrate or pleural effusion.
Lateral views are nondiagnostic due to underpenetration secondary
to body habitus.
No gross evidence of pneumothorax.
IMPRESSION: Enlargement of cardiac silhouette with pulmonary vascular
congestion.
Bibasilar atelectasis.

## 2014-01-13 ENCOUNTER — Emergency Department (HOSPITAL_COMMUNITY): Payer: Medicaid Other

## 2014-01-13 ENCOUNTER — Inpatient Hospital Stay (HOSPITAL_COMMUNITY)
Admission: EM | Admit: 2014-01-13 | Discharge: 2014-01-23 | DRG: 291 | Disposition: A | Discharge status: Home Health | Payer: Medicaid Other | Attending: Internal Medicine | Admitting: Internal Medicine

## 2014-01-13 ENCOUNTER — Encounter (HOSPITAL_COMMUNITY): Payer: Self-pay | Admitting: Emergency Medicine

## 2014-01-13 DIAGNOSIS — N183 Chronic kidney disease, stage 3 unspecified: Secondary | ICD-10-CM

## 2014-01-13 DIAGNOSIS — I5032 Chronic diastolic (congestive) heart failure: Secondary | ICD-10-CM

## 2014-01-13 DIAGNOSIS — R0602 Shortness of breath: Secondary | ICD-10-CM

## 2014-01-13 DIAGNOSIS — Z91048 Other nonmedicinal substance allergy status: Secondary | ICD-10-CM

## 2014-01-13 DIAGNOSIS — I2781 Cor pulmonale (chronic): Secondary | ICD-10-CM | POA: Diagnosis present

## 2014-01-13 DIAGNOSIS — Z79891 Long term (current) use of opiate analgesic: Secondary | ICD-10-CM

## 2014-01-13 DIAGNOSIS — G9340 Encephalopathy, unspecified: Secondary | ICD-10-CM

## 2014-01-13 DIAGNOSIS — K219 Gastro-esophageal reflux disease without esophagitis: Secondary | ICD-10-CM | POA: Diagnosis present

## 2014-01-13 DIAGNOSIS — J9600 Acute respiratory failure, unspecified whether with hypoxia or hypercapnia: Secondary | ICD-10-CM | POA: Diagnosis present

## 2014-01-13 DIAGNOSIS — Z882 Allergy status to sulfonamides status: Secondary | ICD-10-CM

## 2014-01-13 DIAGNOSIS — M1990 Unspecified osteoarthritis, unspecified site: Secondary | ICD-10-CM | POA: Diagnosis present

## 2014-01-13 DIAGNOSIS — I5033 Acute on chronic diastolic (congestive) heart failure: Principal | ICD-10-CM | POA: Diagnosis present

## 2014-01-13 DIAGNOSIS — J45901 Unspecified asthma with (acute) exacerbation: Secondary | ICD-10-CM | POA: Diagnosis present

## 2014-01-13 DIAGNOSIS — J9602 Acute respiratory failure with hypercapnia: Secondary | ICD-10-CM

## 2014-01-13 DIAGNOSIS — N179 Acute kidney failure, unspecified: Secondary | ICD-10-CM

## 2014-01-13 DIAGNOSIS — G4733 Obstructive sleep apnea (adult) (pediatric): Secondary | ICD-10-CM | POA: Diagnosis present

## 2014-01-13 DIAGNOSIS — T501X5A Adverse effect of loop [high-ceiling] diuretics, initial encounter: Secondary | ICD-10-CM | POA: Diagnosis present

## 2014-01-13 DIAGNOSIS — J9622 Acute and chronic respiratory failure with hypercapnia: Secondary | ICD-10-CM | POA: Diagnosis present

## 2014-01-13 DIAGNOSIS — I509 Heart failure, unspecified: Secondary | ICD-10-CM

## 2014-01-13 DIAGNOSIS — Z881 Allergy status to other antibiotic agents status: Secondary | ICD-10-CM

## 2014-01-13 DIAGNOSIS — E873 Alkalosis: Secondary | ICD-10-CM | POA: Diagnosis present

## 2014-01-13 DIAGNOSIS — Z87891 Personal history of nicotine dependence: Secondary | ICD-10-CM

## 2014-01-13 DIAGNOSIS — I10 Essential (primary) hypertension: Secondary | ICD-10-CM | POA: Diagnosis present

## 2014-01-13 DIAGNOSIS — Z6841 Body Mass Index (BMI) 40.0 and over, adult: Secondary | ICD-10-CM

## 2014-01-13 DIAGNOSIS — R739 Hyperglycemia, unspecified: Secondary | ICD-10-CM | POA: Diagnosis present

## 2014-01-13 DIAGNOSIS — Z79899 Other long term (current) drug therapy: Secondary | ICD-10-CM

## 2014-01-13 DIAGNOSIS — Z590 Homelessness: Secondary | ICD-10-CM

## 2014-01-13 DIAGNOSIS — N189 Chronic kidney disease, unspecified: Secondary | ICD-10-CM

## 2014-01-13 DIAGNOSIS — I5031 Acute diastolic (congestive) heart failure: Secondary | ICD-10-CM

## 2014-01-13 DIAGNOSIS — E662 Morbid (severe) obesity with alveolar hypoventilation: Secondary | ICD-10-CM | POA: Diagnosis present

## 2014-01-13 DIAGNOSIS — Z825 Family history of asthma and other chronic lower respiratory diseases: Secondary | ICD-10-CM

## 2014-01-13 DIAGNOSIS — G8929 Other chronic pain: Secondary | ICD-10-CM | POA: Diagnosis present

## 2014-01-13 DIAGNOSIS — Z8249 Family history of ischemic heart disease and other diseases of the circulatory system: Secondary | ICD-10-CM

## 2014-01-13 DIAGNOSIS — T380X5A Adverse effect of glucocorticoids and synthetic analogues, initial encounter: Secondary | ICD-10-CM | POA: Diagnosis present

## 2014-01-13 DIAGNOSIS — I129 Hypertensive chronic kidney disease with stage 1 through stage 4 chronic kidney disease, or unspecified chronic kidney disease: Secondary | ICD-10-CM | POA: Diagnosis present

## 2014-01-13 DIAGNOSIS — E785 Hyperlipidemia, unspecified: Secondary | ICD-10-CM | POA: Diagnosis present

## 2014-01-13 DIAGNOSIS — Z9119 Patient's noncompliance with other medical treatment and regimen: Secondary | ICD-10-CM | POA: Diagnosis not present

## 2014-01-13 DIAGNOSIS — J9621 Acute and chronic respiratory failure with hypoxia: Secondary | ICD-10-CM | POA: Diagnosis present

## 2014-01-13 DIAGNOSIS — Z833 Family history of diabetes mellitus: Secondary | ICD-10-CM

## 2014-01-13 DIAGNOSIS — Z888 Allergy status to other drugs, medicaments and biological substances status: Secondary | ICD-10-CM

## 2014-01-13 DIAGNOSIS — E874 Mixed disorder of acid-base balance: Secondary | ICD-10-CM | POA: Diagnosis not present

## 2014-01-13 DIAGNOSIS — Z880 Allergy status to penicillin: Secondary | ICD-10-CM

## 2014-01-13 DIAGNOSIS — J9601 Acute respiratory failure with hypoxia: Secondary | ICD-10-CM

## 2014-01-13 DIAGNOSIS — Z823 Family history of stroke: Secondary | ICD-10-CM

## 2014-01-13 DIAGNOSIS — D72829 Elevated white blood cell count, unspecified: Secondary | ICD-10-CM | POA: Diagnosis present

## 2014-01-13 LAB — RAPID URINE DRUG SCREEN, HOSP PERFORMED
AMPHETAMINES: NOT DETECTED
Barbiturates: NOT DETECTED
Benzodiazepines: NOT DETECTED
COCAINE: NOT DETECTED
Opiates: NOT DETECTED
Tetrahydrocannabinol: NOT DETECTED

## 2014-01-13 LAB — CBC WITH DIFFERENTIAL/PLATELET
BASOS ABS: 0 10*3/uL (ref 0.0–0.1)
BASOS PCT: 0 % (ref 0–1)
EOS ABS: 0 10*3/uL (ref 0.0–0.7)
EOS PCT: 0 % (ref 0–5)
HCT: 39.9 % (ref 36.0–46.0)
Hemoglobin: 11.7 g/dL — ABNORMAL LOW (ref 12.0–15.0)
LYMPHS PCT: 10 % — AB (ref 12–46)
Lymphs Abs: 0.9 10*3/uL (ref 0.7–4.0)
MCH: 27.8 pg (ref 26.0–34.0)
MCHC: 29.3 g/dL — ABNORMAL LOW (ref 30.0–36.0)
MCV: 94.8 fL (ref 78.0–100.0)
MONO ABS: 0.2 10*3/uL (ref 0.1–1.0)
Monocytes Relative: 2 % — ABNORMAL LOW (ref 3–12)
Neutro Abs: 8.2 10*3/uL — ABNORMAL HIGH (ref 1.7–7.7)
Neutrophils Relative %: 88 % — ABNORMAL HIGH (ref 43–77)
PLATELETS: 210 10*3/uL (ref 150–400)
RBC: 4.21 MIL/uL (ref 3.87–5.11)
RDW: 14.8 % (ref 11.5–15.5)
WBC: 9.4 10*3/uL (ref 4.0–10.5)

## 2014-01-13 LAB — URINALYSIS, ROUTINE W REFLEX MICROSCOPIC
Bilirubin Urine: NEGATIVE
GLUCOSE, UA: NEGATIVE mg/dL
Hgb urine dipstick: NEGATIVE
KETONES UR: NEGATIVE mg/dL
Nitrite: NEGATIVE
Protein, ur: NEGATIVE mg/dL
SPECIFIC GRAVITY, URINE: 1.014 (ref 1.005–1.030)
Urobilinogen, UA: 0.2 mg/dL (ref 0.0–1.0)
pH: 6 (ref 5.0–8.0)

## 2014-01-13 LAB — COMPREHENSIVE METABOLIC PANEL
ALK PHOS: 76 U/L (ref 39–117)
ALT: 17 U/L (ref 0–35)
AST: 14 U/L (ref 0–37)
Albumin: 3.4 g/dL — ABNORMAL LOW (ref 3.5–5.2)
Anion gap: 10 (ref 5–15)
BUN: 16 mg/dL (ref 6–23)
CO2: 34 meq/L — AB (ref 19–32)
Calcium: 8.8 mg/dL (ref 8.4–10.5)
Chloride: 101 mEq/L (ref 96–112)
Creatinine, Ser: 1.2 mg/dL — ABNORMAL HIGH (ref 0.50–1.10)
GFR, EST AFRICAN AMERICAN: 60 mL/min — AB (ref >90)
GFR, EST NON AFRICAN AMERICAN: 52 mL/min — AB (ref >90)
GLUCOSE: 100 mg/dL — AB (ref 70–99)
Potassium: 4.5 mEq/L (ref 3.7–5.3)
SODIUM: 145 meq/L (ref 137–147)
Total Bilirubin: 0.3 mg/dL (ref 0.3–1.2)
Total Protein: 7.2 g/dL (ref 6.0–8.3)

## 2014-01-13 LAB — URINE MICROSCOPIC-ADD ON

## 2014-01-13 LAB — TROPONIN I: Troponin I: <0.3 ng/mL (ref <0.30)

## 2014-01-13 LAB — PRO B NATRIURETIC PEPTIDE: Pro B Natriuretic peptide (BNP): 541 pg/mL — ABNORMAL HIGH (ref 0–125)

## 2014-01-13 MED ORDER — METHYLPREDNISOLONE SODIUM SUCC 125 MG IJ SOLR
125.0000 mg | Freq: Once | INTRAMUSCULAR | Status: AC
  Administered 2014-01-14: 125 mg via INTRAVENOUS
  Filled 2014-01-13: qty 2

## 2014-01-13 MED ORDER — ALBUTEROL SULFATE (2.5 MG/3ML) 0.083% IN NEBU
5.0000 mg | INHALATION_SOLUTION | Freq: Once | RESPIRATORY_TRACT | Status: AC
  Administered 2014-01-13: 5 mg via RESPIRATORY_TRACT
  Filled 2014-01-13: qty 6

## 2014-01-13 MED ORDER — IPRATROPIUM BROMIDE 0.02 % IN SOLN
0.5000 mg | Freq: Once | RESPIRATORY_TRACT | Status: AC
  Administered 2014-01-13: 0.5 mg via RESPIRATORY_TRACT
  Filled 2014-01-13: qty 2.5

## 2014-01-13 MED ORDER — FUROSEMIDE 10 MG/ML IJ SOLN
80.0000 mg | Freq: Once | INTRAMUSCULAR | Status: AC
  Administered 2014-01-13: 80 mg via INTRAVENOUS
  Filled 2014-01-13: qty 8

## 2014-01-13 NOTE — ED Notes (Signed)
Pt arrived to the ED with a compliant of shortness of breath.   Pt went to the doctors who gave her a Toradol shot.  Pt states her shortnessof breath has been for three days.  Pt also is complaining of pedal edema for the same period.

## 2014-01-13 NOTE — ED Provider Notes (Signed)
CSN: [id number]     Arrival date & time 01/13/14  1955 History   First MD Initiated Contact with Patient 01/13/14 2022     Chief Complaint  Patient presents with  . Shortness of Breath     (Consider location/radiation/quality/duration/timing/severity/associated sxs/prior Treatment) HPI Patient states she has a history of wheezing and shortness of breath that started about 3-4 days ago. She states she's had a cough with yellow sputum production. She states she's had a fever up to 98.7. She states she started having chills 2-3 days ago. She states she feels hot and dehydrated she is thirsty however she also states she's swollen all over and she feels like "my lungs are floating". She has nausea without vomiting or diarrhea. She is rhinorrhea without sore throat. She states she uses her nebulizer without relief. She went to her PCP office today and they sent her to the ED. They gave her Toradol 60 mg IM, Rocephin 1 g IM, and Decadron 4 mg IM at the office. Thry report her pulse ox was 91 %. Pt states she does not have a small oxygen tank to take with her when she leaves the house.   PCP Dr Gwenevere Ghazi  Past Medical History  Diagnosis Date  . Asthma   . GERD (gastroesophageal reflux disease)   . Hypertension   . Homelessness   . Low back pain   . 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 Arthritis   . Gout   . Hyperlipemia   . Morbid obesity     uses motor wheel chair  . MRSA (methicillin resistant Staphylococcus aureus)     states about a year ago  . CHF (congestive heart failure)    Past Surgical History  Procedure Laterality Date  . Tubal ligation    . Cesarean section     Family History  Problem Relation Age of Onset  . Asthma Mother   . Diabetes Father   . Heart disease Father   . Stroke Father    History  Substance Use Topics  . Smoking status: Former Smoker    Types: Cigarettes    Quit date: 03/25/2003  . Smokeless tobacco: Former Systems developer    Quit date:  05/27/1978  . Alcohol Use: No   Lives with roommate and fiance Oxygen 3 lpm   OB History   Grav Para Term Preterm Abortions TAB SAB Ect Mult Living                 Review of Systems  All other systems reviewed and are negative.     Allergies  Ibuprofen; Adhesive; Azithromycin; Doxycycline; Penicillins; Sulfa antibiotics; and Sulfamethoxazole-trimethoprim  Home Medications   Prior to Admission medications   Medication Sig Start Date End Date Taking? Authorizing Provider  albuterol (PROVENTIL HFA) 108 (90 BASE) MCG/ACT inhaler Inhale 2 puffs into the lungs every 4 (four) hours as needed for wheezing or shortness of breath.    Yes Historical Provider, MD  albuterol (PROVENTIL) (2.5 MG/3ML) 0.083% nebulizer solution Take 2.5 mg by nebulization every 6 (six) hours as needed for wheezing or shortness of breath.   Yes Historical Provider, MD  allopurinol (ZYLOPRIM) 100 MG tablet Take 100 mg by mouth every morning.    Yes Historical Provider, MD  amitriptyline (ELAVIL) 75 MG tablet Take 75 mg by mouth at bedtime.   Yes Historical Provider, MD  ammonium lactate (AMLACTIN) 12 % cream Apply 1 g topically daily as needed for  dry skin.    Yes Historical Provider, MD  aspirin 325 MG tablet Take 325 mg by mouth every morning.    Yes Historical Provider, MD  atorvastatin (LIPITOR) 20 MG tablet Take 20 mg by mouth every morning.    Yes Historical Provider, MD  clonazePAM (KLONOPIN) 1 MG tablet Take 1 mg by mouth 2 (two) times daily.   Yes Historical Provider, MD  diphenhydrAMINE (BENADRYL) 25 mg capsule Take 25 mg by mouth every 6 (six) hours as needed for itching or allergies.    Yes Historical Provider, MD  Emollient (EUCERIN) lotion Apply 5 mLs topically 2 (two) times daily as needed for dry skin.    Yes Historical Provider, MD  furosemide (LASIX) 40 MG tablet Take 40 mg by mouth 2 (two) times daily.   Yes Historical Provider, MD  guaiFENesin (MUCINEX) 600 MG 12 hr tablet Take 600 mg by mouth  2 (two) times daily.   Yes Historical Provider, MD  HYDROcodone-acetaminophen (NORCO) 10-325 MG per tablet Take 1 tablet by mouth every 6 (six) hours as needed for pain.   Yes Historical Provider, MD  hydrOXYzine (ATARAX/VISTARIL) 25 MG tablet Take 25 mg by mouth 3 (three) times daily as needed for anxiety or itching.   Yes Historical Provider, MD  ketoconazole (NIZORAL) 2 % shampoo Apply 1 application topically 2 (two) times a week.   Yes Historical Provider, MD  ketoconazole (NIZORAL) 200 MG tablet Take 200 mg by mouth every morning.    Yes Historical Provider, MD  lisinopril (PRINIVIL,ZESTRIL) 40 MG tablet Take 40 mg by mouth 2 (two) times daily.    Yes Historical Provider, MD  naproxen (NAPROSYN) 500 MG tablet Take 500 mg by mouth 2 (two) times daily with a meal.   Yes Historical Provider, MD  omega-3 acid ethyl esters (LOVAZA) 1 G capsule Take 1 g by mouth 2 (two) times daily.    Yes Historical Provider, MD  pantoprazole (PROTONIX) 40 MG tablet Take 40 mg by mouth every morning.    Yes Historical Provider, MD  promethazine (PHENERGAN) 25 MG tablet Take 25 mg by mouth every 6 (six) hours as needed for nausea.    Yes Historical Provider, MD  solifenacin (VESICARE) 5 MG tablet Take 5 mg by mouth every morning.    Yes Historical Provider, MD  vitamin B-12 (CYANOCOBALAMIN) 1000 MCG tablet Take 1,000 mcg by mouth every morning.    Yes Historical Provider, MD  vitamin E 100 UNIT capsule Take 100 Units by mouth every morning.   Yes Historical Provider, MD   BP 136/65  Pulse 71  Temp(Src) 99.5 F (37.5 C) (Oral)  Resp 18  SpO2 94%  LMP 03/24/2009  Vital signs normal   Physical Exam  Nursing note and vitals reviewed. Constitutional: She is oriented to person, place, and time. She appears well-developed and well-nourished.  Non-toxic appearance. She does not appear ill. No distress.  Morbidly obese  HENT:  Head: Normocephalic and atraumatic.  Right Ear: External ear normal.  Left Ear:  External ear normal.  Nose: Nose normal. No mucosal edema or rhinorrhea.  Mouth/Throat: Oropharynx is clear and moist and mucous membranes are normal. No dental abscesses or uvula swelling.  Eyes: Conjunctivae and EOM are normal. Pupils are equal, round, and reactive to light.  Neck: Normal range of motion and full passive range of motion without pain. Neck supple.  Cardiovascular: Normal rate, regular rhythm and normal heart sounds.  Exam reveals no gallop and no friction rub.  No murmur heard. Pulmonary/Chest: Effort normal. No respiratory distress. She has decreased breath sounds. She has no wheezes. She has no rhonchi. She has no rales. She exhibits no tenderness and no crepitus.  Abdominal: Soft. Normal appearance and bowel sounds are normal. She exhibits no distension. There is no tenderness. There is no rebound and no guarding.  Musculoskeletal: Normal range of motion. She exhibits no edema and no tenderness.  Moves all extremities well.   Neurological: She is alert and oriented to person, place, and time. She has normal strength. No cranial nerve deficit.  Skin: Skin is warm, dry and intact. No rash noted. No erythema. No pallor.  Psychiatric: She has a normal mood and affect. Her speech is normal and behavior is normal. Her mood appears not anxious.    ED Course  Procedures (including critical care time)  Medications  methylPREDNISolone sodium succinate (SOLU-MEDROL) 125 mg/2 mL injection 125 mg (not administered)  albuterol (PROVENTIL) (2.5 MG/3ML) 0.083% nebulizer solution 5 mg (5 mg Nebulization Given 01/13/14 2129)  ipratropium (ATROVENT) nebulizer solution 0.5 mg (0.5 mg Nebulization Given 01/13/14 2129)  furosemide (LASIX) injection 80 mg (80 mg Intravenous Given 01/13/14 2342)  albuterol (PROVENTIL) (2.5 MG/3ML) 0.083% nebulizer solution 5 mg (5 mg Nebulization Given 01/13/14 2348)  ipratropium (ATROVENT) nebulizer solution 0.5 mg (0.5 mg Nebulization Given 01/13/14 2348)    Patient was last admitted in June for exacerbation of her congestive heart failure when her BNP was in the 500 range.  When patient arrived in the ED her initial pulse ox was 89% on room air however she was not on her oxygen.  23:45 Pt still has diminished breath sounds and now has scattered wheezes. Second nebulizer ordered. Nurse just now giving her lasix. Nurse reports patient requesting pain mediation. She did not mention it to me and I was just in her room. Pt states she is not going to be able to go home. Pt took herself to the bathroom in her electric wheelchair.    Review of [facility] shows she gets # 90 hydrocodone 10/325 and # 60 alprazolam 1 mg monthly from Dr [name], last filled Sept 25 (probably today at her visit).   23:53 Dr Humphrey Rolls admit to observation, telemetry  Labs Review Results for orders placed during the hospital encounter of 01/13/14  COMPREHENSIVE METABOLIC PANEL      Result Value Ref Range   Sodium 145  137 - 147 mEq/L   Potassium 4.5  3.7 - 5.3 mEq/L   Chloride 101  96 - 112 mEq/L   CO2 34 (*) 19 - 32 mEq/L   Glucose, Bld 100 (*) 70 - 99 mg/dL   BUN 16  6 - 23 mg/dL   Creatinine, Ser 1.20 (*) 0.50 - 1.10 mg/dL   Calcium 8.8  8.4 - 10.5 mg/dL   Total Protein 7.2  6.0 - 8.3 g/dL   Albumin 3.4 (*) 3.5 - 5.2 g/dL   AST 14  0 - 37 U/L   ALT 17  0 - 35 U/L   Alkaline Phosphatase 76  39 - 117 U/L   Total Bilirubin 0.3  0.3 - 1.2 mg/dL   GFR calc non Af Amer 52 (*) >90 mL/min   GFR calc Af Amer 60 (*) >90 mL/min   Anion gap 10  5 - 15  PRO B NATRIURETIC PEPTIDE      Result Value Ref Range   Pro B Natriuretic peptide (BNP) 541.0 (*) 0 - 125 pg/mL  TROPONIN  I      Result Value Ref Range   Troponin I <0.30  <0.30 ng/mL  URINALYSIS, ROUTINE W REFLEX MICROSCOPIC      Result Value Ref Range   Color, Urine YELLOW  YELLOW   APPearance CLOUDY (*) CLEAR   Specific Gravity, Urine 1.014  1.005 - 1.030   pH 6.0  5.0 - 8.0   Glucose, UA NEGATIVE  NEGATIVE mg/dL    Hgb urine dipstick NEGATIVE  NEGATIVE   Bilirubin Urine NEGATIVE  NEGATIVE   Ketones, ur NEGATIVE  NEGATIVE mg/dL   Protein, ur NEGATIVE  NEGATIVE mg/dL   Urobilinogen, UA 0.2  0.0 - 1.0 mg/dL   Nitrite NEGATIVE  NEGATIVE   Leukocytes, UA TRACE (*) NEGATIVE  URINE RAPID DRUG SCREEN (HOSP PERFORMED)      Result Value Ref Range   Opiates NONE DETECTED  NONE DETECTED   Cocaine NONE DETECTED  NONE DETECTED   Benzodiazepines NONE DETECTED  NONE DETECTED   Amphetamines NONE DETECTED  NONE DETECTED   Tetrahydrocannabinol NONE DETECTED  NONE DETECTED   Barbiturates NONE DETECTED  NONE DETECTED  CBC WITH DIFFERENTIAL      Result Value Ref Range   WBC 9.4  4.0 - 10.5 K/uL   RBC 4.21  3.87 - 5.11 MIL/uL   Hemoglobin 11.7 (*) 12.0 - 15.0 g/dL   HCT 39.9  36.0 - 46.0 %   MCV 94.8  78.0 - 100.0 fL   MCH 27.8  26.0 - 34.0 pg   MCHC 29.3 (*) 30.0 - 36.0 g/dL   RDW 14.8  11.5 - 15.5 %   Platelets 210  150 - 400 K/uL   Neutrophils Relative % 88 (*) 43 - 77 %   Neutro Abs 8.2 (*) 1.7 - 7.7 K/uL   Lymphocytes Relative 10 (*) 12 - 46 %   Lymphs Abs 0.9  0.7 - 4.0 K/uL   Monocytes Relative 2 (*) 3 - 12 %   Monocytes Absolute 0.2  0.1 - 1.0 K/uL   Eosinophils Relative 0  0 - 5 %   Eosinophils Absolute 0.0  0.0 - 0.7 K/uL   Basophils Relative 0  0 - 1 %   Basophils Absolute 0.0  0.0 - 0.1 K/uL  URINE MICROSCOPIC-ADD ON      Result Value Ref Range   Squamous Epithelial / LPF MANY (*) RARE   WBC, UA 0-2  <3 WBC/hpf   Bacteria, UA FEW (*) RARE    Laboratory interpretation all normal except mildly elevated BNP   Imaging Review Dg Chest 2 View  01/13/2014   CLINICAL DATA:  Shortness of breath.  Onset 3 days ago.  EXAM: CHEST  2 VIEW  COMPARISON:  09/19/2013  FINDINGS: Cardiomegaly with vascular congestion and probable early interstitial edema. Low lung volumes with bibasilar atelectasis. No definite effusions. No acute bony abnormality.  IMPRESSION: Suspect mild interstitial edema/CHF. Low  lung volumes with bibasilar atelectasis.   Electronically Signed   By: Rolm Baptise M.D.   On: 01/13/2014 21:39     EKG Interpretation None      MDM   Final diagnoses:  Shortness of breath     Plan admission  Rolland Porter, MD, Alanson Aly, MD 01/14/14 0002

## 2014-01-14 ENCOUNTER — Encounter (HOSPITAL_COMMUNITY): Payer: Self-pay | Admitting: *Deleted

## 2014-01-14 DIAGNOSIS — R0602 Shortness of breath: Secondary | ICD-10-CM

## 2014-01-14 LAB — COMPREHENSIVE METABOLIC PANEL
ALK PHOS: 79 U/L (ref 39–117)
ALT: 17 U/L (ref 0–35)
AST: 12 U/L (ref 0–37)
Albumin: 3.6 g/dL (ref 3.5–5.2)
Anion gap: 14 (ref 5–15)
BILIRUBIN TOTAL: 0.3 mg/dL (ref 0.3–1.2)
BUN: 20 mg/dL (ref 6–23)
CO2: 33 mEq/L — ABNORMAL HIGH (ref 19–32)
Calcium: 9 mg/dL (ref 8.4–10.5)
Chloride: 97 mEq/L (ref 96–112)
Creatinine, Ser: 1.27 mg/dL — ABNORMAL HIGH (ref 0.50–1.10)
GFR calc Af Amer: 56 mL/min — ABNORMAL LOW (ref >90)
GFR calc non Af Amer: 49 mL/min — ABNORMAL LOW (ref >90)
GLUCOSE: 208 mg/dL — AB (ref 70–99)
Potassium: 4 mEq/L (ref 3.7–5.3)
Sodium: 144 mEq/L (ref 137–147)
Total Protein: 7.7 g/dL (ref 6.0–8.3)

## 2014-01-14 LAB — CBC
HCT: 42.7 % (ref 36.0–46.0)
Hemoglobin: 12.6 g/dL (ref 12.0–15.0)
MCH: 27.9 pg (ref 26.0–34.0)
MCHC: 29.5 g/dL — AB (ref 30.0–36.0)
MCV: 94.7 fL (ref 78.0–100.0)
Platelets: 247 10*3/uL (ref 150–400)
RBC: 4.51 MIL/uL (ref 3.87–5.11)
RDW: 14.7 % (ref 11.5–15.5)
WBC: 10.6 10*3/uL — ABNORMAL HIGH (ref 4.0–10.5)

## 2014-01-14 LAB — MRSA PCR SCREENING: MRSA BY PCR: POSITIVE — AB

## 2014-01-14 LAB — GLUCOSE, CAPILLARY: GLUCOSE-CAPILLARY: 157 mg/dL — AB (ref 70–99)

## 2014-01-14 MED ORDER — DIPHENHYDRAMINE HCL 25 MG PO CAPS
25.0000 mg | ORAL_CAPSULE | Freq: Four times a day (QID) | ORAL | Status: DC | PRN
  Administered 2014-01-14 – 2014-01-23 (×11): 25 mg via ORAL
  Filled 2014-01-14 (×11): qty 1

## 2014-01-14 MED ORDER — MUPIROCIN 2 % EX OINT
1.0000 "application " | TOPICAL_OINTMENT | Freq: Two times a day (BID) | CUTANEOUS | Status: AC
  Administered 2014-01-14 – 2014-01-18 (×10): 1 via NASAL
  Filled 2014-01-14 (×3): qty 22

## 2014-01-14 MED ORDER — CLONAZEPAM 1 MG PO TABS
1.0000 mg | ORAL_TABLET | Freq: Two times a day (BID) | ORAL | Status: DC
  Administered 2014-01-14 – 2014-01-16 (×6): 1 mg via ORAL
  Filled 2014-01-14 (×6): qty 1

## 2014-01-14 MED ORDER — IPRATROPIUM-ALBUTEROL 0.5-2.5 (3) MG/3ML IN SOLN
3.0000 mL | Freq: Four times a day (QID) | RESPIRATORY_TRACT | Status: DC
  Administered 2014-01-14 – 2014-01-15 (×5): 3 mL via RESPIRATORY_TRACT
  Filled 2014-01-14 (×5): qty 3

## 2014-01-14 MED ORDER — FOLIC ACID 1 MG PO TABS
1.0000 mg | ORAL_TABLET | Freq: Every day | ORAL | Status: DC
  Administered 2014-01-14 – 2014-01-23 (×10): 1 mg via ORAL
  Filled 2014-01-14 (×10): qty 1

## 2014-01-14 MED ORDER — SODIUM CHLORIDE 0.9 % IJ SOLN
3.0000 mL | Freq: Two times a day (BID) | INTRAMUSCULAR | Status: DC
  Administered 2014-01-14 – 2014-01-17 (×3): 3 mL via INTRAVENOUS

## 2014-01-14 MED ORDER — FUROSEMIDE 10 MG/ML IJ SOLN
40.0000 mg | Freq: Every day | INTRAMUSCULAR | Status: DC
  Administered 2014-01-14: 40 mg via INTRAVENOUS
  Filled 2014-01-14: qty 4

## 2014-01-14 MED ORDER — PANTOPRAZOLE SODIUM 40 MG PO TBEC
40.0000 mg | DELAYED_RELEASE_TABLET | Freq: Every morning | ORAL | Status: DC
  Administered 2014-01-14 – 2014-01-23 (×10): 40 mg via ORAL
  Filled 2014-01-14 (×10): qty 1

## 2014-01-14 MED ORDER — SODIUM CHLORIDE 0.9 % IJ SOLN
3.0000 mL | Freq: Two times a day (BID) | INTRAMUSCULAR | Status: DC
  Administered 2014-01-14 – 2014-01-22 (×17): 3 mL via INTRAVENOUS

## 2014-01-14 MED ORDER — ONDANSETRON HCL 4 MG/2ML IJ SOLN
4.0000 mg | Freq: Four times a day (QID) | INTRAMUSCULAR | Status: DC | PRN
  Administered 2014-01-15 – 2014-01-17 (×3): 4 mg via INTRAVENOUS
  Filled 2014-01-14 (×3): qty 2

## 2014-01-14 MED ORDER — DARIFENACIN HYDROBROMIDE ER 7.5 MG PO TB24
7.5000 mg | ORAL_TABLET | Freq: Every day | ORAL | Status: DC
  Administered 2014-01-14 – 2014-01-23 (×10): 7.5 mg via ORAL
  Filled 2014-01-14 (×10): qty 1

## 2014-01-14 MED ORDER — SODIUM CHLORIDE 0.9 % IJ SOLN: 3.0000 mL | INTRAMUSCULAR | Status: DC | PRN

## 2014-01-14 MED ORDER — HYDROXYZINE HCL 25 MG PO TABS
25.0000 mg | ORAL_TABLET | Freq: Three times a day (TID) | ORAL | Status: DC | PRN
  Administered 2014-01-14: 25 mg via ORAL
  Filled 2014-01-14: qty 1

## 2014-01-14 MED ORDER — HYDROCODONE-ACETAMINOPHEN 5-325 MG PO TABS
1.0000 | ORAL_TABLET | ORAL | Status: DC | PRN
  Administered 2014-01-14 – 2014-01-16 (×9): 2 via ORAL
  Filled 2014-01-14 (×10): qty 2

## 2014-01-14 MED ORDER — VITAMIN E 45 MG (100 UNIT) PO CAPS
100.0000 [IU] | ORAL_CAPSULE | Freq: Every morning | ORAL | Status: DC
  Administered 2014-01-14 – 2014-01-23 (×10): 100 [IU] via ORAL
  Filled 2014-01-14 (×10): qty 1

## 2014-01-14 MED ORDER — HYDROCERIN EX CREA
TOPICAL_CREAM | Freq: Two times a day (BID) | CUTANEOUS | Status: DC
  Administered 2014-01-15 – 2014-01-23 (×18): via TOPICAL
  Filled 2014-01-14 (×2): qty 113

## 2014-01-14 MED ORDER — ONDANSETRON HCL 4 MG PO TABS: 4.0000 mg | ORAL_TABLET | Freq: Four times a day (QID) | ORAL | Status: DC | PRN

## 2014-01-14 MED ORDER — SODIUM CHLORIDE 0.9 % IV SOLN: 250.0000 mL | INTRAVENOUS | Status: DC | PRN

## 2014-01-14 MED ORDER — ATORVASTATIN CALCIUM 20 MG PO TABS
20.0000 mg | ORAL_TABLET | Freq: Every morning | ORAL | Status: DC
  Administered 2014-01-14 – 2014-01-22 (×9): 20 mg via ORAL
  Filled 2014-01-14 (×3): qty 2
  Filled 2014-01-14 (×3): qty 1
  Filled 2014-01-14: qty 2
  Filled 2014-01-14 (×3): qty 1
  Filled 2014-01-14: qty 2

## 2014-01-14 MED ORDER — IPRATROPIUM-ALBUTEROL 0.5-2.5 (3) MG/3ML IN SOLN
3.0000 mL | RESPIRATORY_TRACT | Status: DC
  Administered 2014-01-14: 3 mL via RESPIRATORY_TRACT
  Filled 2014-01-14: qty 3

## 2014-01-14 MED ORDER — CHLORHEXIDINE GLUCONATE CLOTH 2 % EX PADS
6.0000 | MEDICATED_PAD | Freq: Every day | CUTANEOUS | Status: AC
  Administered 2014-01-14 – 2014-01-18 (×4): 6 via TOPICAL

## 2014-01-14 MED ORDER — AMITRIPTYLINE HCL 25 MG PO TABS
75.0000 mg | ORAL_TABLET | Freq: Every day | ORAL | Status: DC
  Administered 2014-01-14 – 2014-01-15 (×3): 75 mg via ORAL
  Filled 2014-01-14 (×3): qty 1

## 2014-01-14 MED ORDER — LISINOPRIL 10 MG PO TABS
40.0000 mg | ORAL_TABLET | Freq: Two times a day (BID) | ORAL | Status: DC
  Administered 2014-01-14 – 2014-01-15 (×5): 40 mg via ORAL
  Filled 2014-01-14 (×6): qty 1

## 2014-01-14 MED ORDER — ADULT MULTIVITAMIN W/MINERALS CH
1.0000 | ORAL_TABLET | Freq: Every day | ORAL | Status: DC
  Administered 2014-01-14 – 2014-01-23 (×10): 1 via ORAL
  Filled 2014-01-14 (×10): qty 1

## 2014-01-14 MED ORDER — CETYLPYRIDINIUM CHLORIDE 0.05 % MT LIQD
7.0000 mL | Freq: Two times a day (BID) | OROMUCOSAL | Status: DC
  Administered 2014-01-14 – 2014-01-23 (×19): 7 mL via OROMUCOSAL

## 2014-01-14 MED ORDER — KETOCONAZOLE 200 MG PO TABS
200.0000 mg | ORAL_TABLET | Freq: Every morning | ORAL | Status: DC
  Administered 2014-01-14 – 2014-01-17 (×4): 200 mg via ORAL
  Filled 2014-01-14 (×6): qty 1

## 2014-01-14 MED ORDER — ALLOPURINOL 100 MG PO TABS
100.0000 mg | ORAL_TABLET | Freq: Every morning | ORAL | Status: DC
  Administered 2014-01-14 – 2014-01-23 (×10): 100 mg via ORAL
  Filled 2014-01-14 (×10): qty 1

## 2014-01-14 MED ORDER — ZOLPIDEM TARTRATE 5 MG PO TABS: 5.0000 mg | ORAL_TABLET | Freq: Every evening | ORAL | Status: DC | PRN

## 2014-01-14 MED ORDER — ASPIRIN 325 MG PO TABS
325.0000 mg | ORAL_TABLET | Freq: Every morning | ORAL | Status: DC
  Administered 2014-01-14 – 2014-01-23 (×10): 325 mg via ORAL
  Filled 2014-01-14 (×10): qty 1

## 2014-01-14 MED ORDER — PROMETHAZINE HCL 25 MG PO TABS: 25.0000 mg | ORAL_TABLET | Freq: Four times a day (QID) | ORAL | Status: DC | PRN

## 2014-01-14 MED ORDER — HEPARIN SODIUM (PORCINE) 5000 UNIT/ML IJ SOLN
5000.0000 [IU] | Freq: Three times a day (TID) | INTRAMUSCULAR | Status: DC
  Administered 2014-01-14 – 2014-01-17 (×10): 5000 [IU] via SUBCUTANEOUS
  Filled 2014-01-14 (×11): qty 1

## 2014-01-14 MED ORDER — VITAMIN B-1 100 MG PO TABS
100.0000 mg | ORAL_TABLET | Freq: Every day | ORAL | Status: DC
  Administered 2014-01-14 – 2014-01-23 (×10): 100 mg via ORAL
  Filled 2014-01-14 (×10): qty 1

## 2014-01-14 MED ORDER — MORPHINE SULFATE 2 MG/ML IJ SOLN
1.0000 mg | INTRAMUSCULAR | Status: DC | PRN
  Administered 2014-01-14 – 2014-01-15 (×4): 1 mg via INTRAVENOUS
  Filled 2014-01-14 (×4): qty 1

## 2014-01-14 MED ORDER — ALBUTEROL SULFATE (2.5 MG/3ML) 0.083% IN NEBU
2.5000 mg | INHALATION_SOLUTION | Freq: Four times a day (QID) | RESPIRATORY_TRACT | Status: DC | PRN
Start: 2014-01-14 — End: 2014-01-15

## 2014-01-14 MED ORDER — AMMONIUM LACTATE 12 % EX LOTN
1.0000 "application " | TOPICAL_LOTION | Freq: Every day | CUTANEOUS | Status: DC | PRN
  Administered 2014-01-14: 1 via TOPICAL
  Filled 2014-01-14: qty 400

## 2014-01-14 NOTE — Progress Notes (Signed)
Pt would like Hyrdrocortisone cream for the itching of her neck.  Pt would like to be put on a "regular" diet. Please advise.
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
## 2014-01-14 NOTE — Progress Notes (Addendum)
Patient ID: HELEYNA CROCCO, female   DOB: [birth year], 48 y.o.   MRN: [id number]  [facility] PROGRESS NOTE  Erykah Muff Ang [id number] DOB: Nov 03, 1965 DOA: 01/13/2014 PCP: Harvie Junior, MD  Brief narrative: 48 y.o. Female, morbidly obese, with underlying diastolic CHF (difficult to assess on 2 D ECHO due to body habitus), CKD stage II - III, HTN, asthma, sleep apnea and on CPAP at home, presented to [facility] ED with main concern of several days duration of progressively worsening shortness of breath, worse with lying down and with exertion, associated with LE swelling and 3 - 4 pillow orthopnea. She was referred to ED by her PCP for further evaluation. No reported fevers, chills, chest pain, abd or urinary concerns.   Assessment/Plan:    Active Problems:   Acute hypoxic respiratory failure - multifactorial and secondary to OHS, acute on chronic diastolic CHF  - pt clinically stable this AM, occasional drop in oxygen saturation to 87 - 90 % on 2 L [place] - continue Lasix 40 mg IV QD, weight is down: 440 --> 439 lbs this AM - continue to provide oxygen via [place] - continue BD's scheduled and as needed    Acute on chronic diastolic CHF - continue Lasix 40 mg IV QD - daily weights, strict I's and O's   OHS with sleep apnea - continue CPAP at night    Essential hypertension - continue Lisinopril and Lasix    Morbid obesity, BMI > 50    Leukocytosis - no clear sings of na infectious etiology, pt afebrile - likely from one dose of solumedrol 125 mg IV that pt has received in ED - pt has no wheezing on exam this AM and steroids were not continued upon admission  - repeat CBC in AM   CKD, stage II - III  - Cr remains at baseline   DVT Prophylaxis: Heparin SQ   Code Status: Full.  Family Communication:  plan of care discussed with the patient Disposition Plan: Home when stable.   IV Access:   Peripheral IV Procedures and diagnostic studies:    CXR  01/13/2014  Suspect mild  interstitial edema/CHF. Low lung volumes with bibasilar atelectasis.    Medical Consultants:   None  Other Consultants:   None  Anti-Infectives:   None   Leisa Lenz, MD  [facility] Pager [phone]  If 7PM-7AM, please contact night-coverage [email or web address] Password [email or web address] 01/14/2014, 10:45 AM   LOS: 1 day    HPI/Subjective: No acute overnight events.  Objective: Filed Vitals:   01/14/14 0320 01/14/14 0434 01/14/14 0626 01/14/14 0834  BP:      Pulse:   77   Temp:   98.1 F (36.7 C)   TempSrc:   Oral   Resp:   20   Height:      Weight:  199.129 kg (439 lb)    SpO2: 94%  92% 92%    Intake/Output Summary (Last 24 hours) at 01/14/14 1045 Last data filed at 01/14/14 0913  Gross per 24 hour  Intake    480 ml  Output   1050 ml  Net   -570 ml    Exam:   General:  Pt is alert, morbidly obese, not in acute distress  Cardiovascular: Regular rate and rhythm, S1/S2, no murmurs  Respiratory: Clear to auscultation bilaterally, diminished breath sounds at bases   Abdomen: Soft, non tender, non distended, bowel sounds present  Extremities: pulses DP and PT palpable bilaterally, chronic LE venous stasis changes  Neuro: Grossly nonfocal  Data Reviewed: Basic Metabolic Panel:  Recent Labs Lab 01/13/14 2108 01/14/14 0512  NA 145 144  K 4.5 4.0  CL 101 97  CO2 34* 33*  GLUCOSE 100* 208*  BUN 16 20  CREATININE 1.20* 1.27*  CALCIUM 8.8 9.0   Liver Function Tests:  Recent Labs Lab 01/13/14 2108 01/14/14 0512  AST 14 12  ALT 17 17  ALKPHOS 76 79  BILITOT 0.3 0.3  PROT 7.2 7.7  ALBUMIN 3.4* 3.6   CBC:  Recent Labs Lab 01/13/14 2240 01/14/14 0512  WBC 9.4 10.6*  NEUTROABS 8.2*  --   HGB 11.7* 12.6  HCT 39.9 42.7  MCV 94.8 94.7  PLT 210 247   Cardiac Enzymes:  Recent Labs Lab 01/13/14 2108  TROPONINI <0.30   CBG:  Recent Labs Lab 01/14/14 0739  GLUCAP 157*    Recent Results (from the past 240 hour(s))  MRSA PCR SCREENING      Status: Abnormal   Collection Time    01/14/14  2:15 AM      Result Value Ref Range Status   MRSA by PCR POSITIVE (*) NEGATIVE Final   Comment:            The GeneXpert MRSA Assay (FDA     approved for NASAL specimens     only), is one component of a     comprehensive MRSA colonization     surveillance program. It is not     intended to diagnose MRSA     infection nor to guide or     monitor treatment for     MRSA infections.     RESULT CALLED TO, READ BACK BY AND VERIFIED WITH:     L.[email or web address],RN AT 0407 ON 10.24.15 BY SHEA.W     Scheduled Meds: . allopurinol  100 mg Oral q morning - 10a  . amitriptyline  75 mg Oral QHS  . aspirin  325 mg Oral q morning - 10a  . atorvastatin  20 mg Oral q morning - 10a  . clonazePAM  1 mg Oral BID  . darifenacin  7.5 mg Oral Daily  . folic acid  1 mg Oral Daily  . furosemide  40 mg Intravenous Daily  . heparin  5,000 Units Subcutaneous 3 times per day  . ipratropium-albuterol  3 mL Nebulization Q6H WA  . ketoconazole  200 mg Oral q morning - 10a  . lisinopril  40 mg Oral BID  . mupirocin ointment  1 application Nasal BID  . pantoprazole  40 mg Oral q morning - 10a   Continuous Infusions:

## 2014-01-14 NOTE — Progress Notes (Signed)
Pt refused insulin injection.

## 2014-01-14 NOTE — Progress Notes (Signed)
UR completed 

## 2014-01-14 NOTE — ED Notes (Signed)
Pt given sandwich. Also pt requesting for the MD to order her a regular diet and not heart healthy however this RN attempted to re-educate the pt regarding need for heart healthy diet considering her current dx of CHF. Also pt states that she will drive her motorized wheelchair to her room upstairs, however this RN is not in agreement with that plan. It is agreed that pt's fiance at bedside and transfer the chair and that pt will be transported by hospital staff on a stretcher, monitor and o2.

## 2014-01-14 NOTE — Progress Notes (Signed)
CRITICAL VALUE ALERT  Critical value received:  MRSA pcr positive  Date of notification:  01/14/2014  Time of notification:  0410  Critical value read back:Yes.    Nurse who received alert:  Daleen Bo RN  MD notified (1st page):  Protocol applied  Time of first page:  Not applicable.  MD notified (2nd page):  Time of second page:  Responding MD:  Not applicable.  Time MD responded:  Not applicable.

## 2014-01-15 DIAGNOSIS — Z590 Homelessness: Secondary | ICD-10-CM | POA: Diagnosis not present

## 2014-01-15 DIAGNOSIS — I2781 Cor pulmonale (chronic): Secondary | ICD-10-CM | POA: Diagnosis present

## 2014-01-15 DIAGNOSIS — R0602 Shortness of breath: Secondary | ICD-10-CM | POA: Diagnosis present

## 2014-01-15 DIAGNOSIS — Z79891 Long term (current) use of opiate analgesic: Secondary | ICD-10-CM | POA: Diagnosis not present

## 2014-01-15 DIAGNOSIS — G9340 Encephalopathy, unspecified: Secondary | ICD-10-CM | POA: Diagnosis not present

## 2014-01-15 DIAGNOSIS — J45901 Unspecified asthma with (acute) exacerbation: Secondary | ICD-10-CM | POA: Diagnosis present

## 2014-01-15 DIAGNOSIS — I129 Hypertensive chronic kidney disease with stage 1 through stage 4 chronic kidney disease, or unspecified chronic kidney disease: Secondary | ICD-10-CM | POA: Diagnosis present

## 2014-01-15 DIAGNOSIS — Z87891 Personal history of nicotine dependence: Secondary | ICD-10-CM | POA: Diagnosis not present

## 2014-01-15 DIAGNOSIS — J9622 Acute and chronic respiratory failure with hypercapnia: Secondary | ICD-10-CM | POA: Diagnosis present

## 2014-01-15 DIAGNOSIS — T501X5A Adverse effect of loop [high-ceiling] diuretics, initial encounter: Secondary | ICD-10-CM | POA: Diagnosis present

## 2014-01-15 DIAGNOSIS — Z8249 Family history of ischemic heart disease and other diseases of the circulatory system: Secondary | ICD-10-CM | POA: Diagnosis not present

## 2014-01-15 DIAGNOSIS — N183 Chronic kidney disease, stage 3 (moderate): Secondary | ICD-10-CM

## 2014-01-15 DIAGNOSIS — I5033 Acute on chronic diastolic (congestive) heart failure: Secondary | ICD-10-CM | POA: Diagnosis present

## 2014-01-15 DIAGNOSIS — E662 Morbid (severe) obesity with alveolar hypoventilation: Secondary | ICD-10-CM | POA: Diagnosis present

## 2014-01-15 DIAGNOSIS — J9621 Acute and chronic respiratory failure with hypoxia: Secondary | ICD-10-CM | POA: Diagnosis present

## 2014-01-15 DIAGNOSIS — E785 Hyperlipidemia, unspecified: Secondary | ICD-10-CM | POA: Diagnosis present

## 2014-01-15 DIAGNOSIS — Z833 Family history of diabetes mellitus: Secondary | ICD-10-CM | POA: Diagnosis not present

## 2014-01-15 DIAGNOSIS — E873 Alkalosis: Secondary | ICD-10-CM | POA: Diagnosis present

## 2014-01-15 DIAGNOSIS — Z825 Family history of asthma and other chronic lower respiratory diseases: Secondary | ICD-10-CM | POA: Diagnosis not present

## 2014-01-15 DIAGNOSIS — K219 Gastro-esophageal reflux disease without esophagitis: Secondary | ICD-10-CM | POA: Diagnosis present

## 2014-01-15 DIAGNOSIS — G8929 Other chronic pain: Secondary | ICD-10-CM | POA: Diagnosis present

## 2014-01-15 DIAGNOSIS — D72829 Elevated white blood cell count, unspecified: Secondary | ICD-10-CM | POA: Diagnosis present

## 2014-01-15 DIAGNOSIS — Z823 Family history of stroke: Secondary | ICD-10-CM | POA: Diagnosis not present

## 2014-01-15 DIAGNOSIS — Z9119 Patient's noncompliance with other medical treatment and regimen: Secondary | ICD-10-CM | POA: Diagnosis not present

## 2014-01-15 DIAGNOSIS — Z91048 Other nonmedicinal substance allergy status: Secondary | ICD-10-CM | POA: Diagnosis not present

## 2014-01-15 DIAGNOSIS — E874 Mixed disorder of acid-base balance: Secondary | ICD-10-CM | POA: Diagnosis not present

## 2014-01-15 DIAGNOSIS — Z79899 Other long term (current) drug therapy: Secondary | ICD-10-CM | POA: Diagnosis not present

## 2014-01-15 DIAGNOSIS — Z6841 Body Mass Index (BMI) 40.0 and over, adult: Secondary | ICD-10-CM | POA: Diagnosis not present

## 2014-01-15 DIAGNOSIS — T380X5A Adverse effect of glucocorticoids and synthetic analogues, initial encounter: Secondary | ICD-10-CM | POA: Diagnosis present

## 2014-01-15 DIAGNOSIS — Z882 Allergy status to sulfonamides status: Secondary | ICD-10-CM | POA: Diagnosis not present

## 2014-01-15 DIAGNOSIS — Z880 Allergy status to penicillin: Secondary | ICD-10-CM | POA: Diagnosis not present

## 2014-01-15 DIAGNOSIS — Z881 Allergy status to other antibiotic agents status: Secondary | ICD-10-CM | POA: Diagnosis not present

## 2014-01-15 DIAGNOSIS — R739 Hyperglycemia, unspecified: Secondary | ICD-10-CM | POA: Diagnosis present

## 2014-01-15 DIAGNOSIS — Z888 Allergy status to other drugs, medicaments and biological substances status: Secondary | ICD-10-CM | POA: Diagnosis not present

## 2014-01-15 DIAGNOSIS — M1990 Unspecified osteoarthritis, unspecified site: Secondary | ICD-10-CM | POA: Diagnosis present

## 2014-01-15 LAB — GLUCOSE, CAPILLARY
GLUCOSE-CAPILLARY: 151 mg/dL — AB (ref 70–99)
Glucose-Capillary: 123 mg/dL — ABNORMAL HIGH (ref 70–99)
Glucose-Capillary: 167 mg/dL — ABNORMAL HIGH (ref 70–99)
Glucose-Capillary: 157 mg/dL — ABNORMAL HIGH (ref 70–99)

## 2014-01-15 LAB — BASIC METABOLIC PANEL
Anion gap: 9 (ref 5–15)
BUN: 21 mg/dL (ref 6–23)
CALCIUM: 8.6 mg/dL (ref 8.4–10.5)
CO2: 38 mEq/L — ABNORMAL HIGH (ref 19–32)
Chloride: 94 mEq/L — ABNORMAL LOW (ref 96–112)
Creatinine, Ser: 1.32 mg/dL — ABNORMAL HIGH (ref 0.50–1.10)
GFR, EST AFRICAN AMERICAN: 54 mL/min — AB (ref >90)
GFR, EST NON AFRICAN AMERICAN: 46 mL/min — AB (ref >90)
Glucose, Bld: 119 mg/dL — ABNORMAL HIGH (ref 70–99)
Potassium: 4.6 mEq/L (ref 3.7–5.3)
SODIUM: 141 meq/L (ref 137–147)

## 2014-01-15 LAB — CBC
HEMATOCRIT: 42.2 % (ref 36.0–46.0)
Hemoglobin: 12.2 g/dL (ref 12.0–15.0)
MCH: 28 pg (ref 26.0–34.0)
MCHC: 28.9 g/dL — AB (ref 30.0–36.0)
MCV: 96.8 fL (ref 78.0–100.0)
Platelets: 263 10*3/uL (ref 150–400)
RBC: 4.36 MIL/uL (ref 3.87–5.11)
RDW: 15.2 % (ref 11.5–15.5)
WBC: 13.4 10*3/uL — ABNORMAL HIGH (ref 4.0–10.5)

## 2014-01-15 MED ORDER — GUAIFENESIN 100 MG/5ML PO SOLN
200.0000 mg | ORAL | Status: DC | PRN
  Administered 2014-01-15 – 2014-01-22 (×5): 200 mg via ORAL
  Filled 2014-01-15 (×4): qty 10

## 2014-01-15 MED ORDER — IPRATROPIUM-ALBUTEROL 0.5-2.5 (3) MG/3ML IN SOLN: 3.0000 mL | RESPIRATORY_TRACT | Status: DC | PRN

## 2014-01-15 MED ORDER — DM-GUAIFENESIN ER 30-600 MG PO TB12
1.0000 | ORAL_TABLET | Freq: Two times a day (BID) | ORAL | Status: DC
  Administered 2014-01-15 – 2014-01-23 (×16): 1 via ORAL
  Filled 2014-01-15 (×18): qty 1

## 2014-01-15 MED ORDER — IPRATROPIUM-ALBUTEROL 0.5-2.5 (3) MG/3ML IN SOLN
3.0000 mL | RESPIRATORY_TRACT | Status: DC
  Administered 2014-01-15 – 2014-01-17 (×11): 3 mL via RESPIRATORY_TRACT
  Filled 2014-01-15 (×11): qty 3

## 2014-01-15 MED ORDER — FUROSEMIDE 40 MG PO TABS
40.0000 mg | ORAL_TABLET | Freq: Two times a day (BID) | ORAL | Status: DC
  Administered 2014-01-15 – 2014-01-16 (×3): 40 mg via ORAL
  Filled 2014-01-15 (×3): qty 1

## 2014-01-15 MED ORDER — METHYLPREDNISOLONE SODIUM SUCC 125 MG IJ SOLR
60.0000 mg | Freq: Four times a day (QID) | INTRAMUSCULAR | Status: DC
  Administered 2014-01-15 – 2014-01-17 (×8): 60 mg via INTRAVENOUS
  Filled 2014-01-15: qty 2
  Filled 2014-01-15: qty 0.96
  Filled 2014-01-15 (×2): qty 2
  Filled 2014-01-15 (×5): qty 0.96

## 2014-01-15 NOTE — Progress Notes (Signed)
Patient ID: Kara Vincent, female   DOB: 03/24/1966, 48 y.o.   MRN: [id number] [facility] PROGRESS NOTE  Kara Vincent [id number] DOB: April 25, 1965 DOA: 01/13/2014 PCP: Harvie Junior, MD  Brief narrative: 48 y.o. Female, morbidly obese, with underlying diastolic CHF (difficult to assess on 2 D ECHO due to body habitus), CKD stage II - III, HTN, asthma, sleep apnea and on CPAP at home, presented to [facility] ED 01/13/2014 with main concern of several days duration of progressively worsening shortness of breath, worse with lying down and with exertion, associated with LE swelling and 3 - 4 pillow orthopnea. She was referred to ED by her PCP for further evaluation. On admission, troponin level was normal, pro BNP was 541. UA showed trace leukocytes but patient did not have GU complaints. Creatinine was 1.20.  CXR showed mild interstitial edema, CHF. She was started on IV lasix with relatively good urine output. She continues to have more wheezing so we started more aggressive nebulizer regimen for asthma exacerbation.  Assessment/Plan:    Principal Problem: Acute hypoxic respiratory failure / acute systolic and diastolic CHF / Acute asthma exacerbation / OSA OHS - likely multifactorial and secondary to combination of acute mild diastolic CHF (note mild elevation in BNP), also possible asthma exacerbation considering wheezing. Since she is morbidly obese and has OSA she likely has obesity hypoventilation syndrome. Using CPAP at bedtime. - patient has had 2 D ECHO in 08/2013 with mildly reduced EF 55% although difficult to assess due to body habitus. BNP mildly elevated on admission 541. Has received lasix IV since admission. Lamar Benes is 199-200. - added BD for asthma exacerbation: duoneb every 4 hours scheduled and every 2 hours PRN shortness of breath and/or wheezing.  - added solumedrol 60 mg Q 6 hours. - CPAP at bedtime.  - oxygen support via [place] to keep O2 sats above 90%  Active  Problems: Acute systolic and diastolic CHF - as noted above last 2 D ECHO in 06/2015with mildly reduced EF 55% but technically difficult study due to obese body habitus. - no complaints of chest pain. BNP 541 on this admission. Has gotten lasix IV daily since admission. This is now changed to PO lasix per home dose. - continue daily weight; weight today 200 lbs (yesterday 199.9 lbs). Need to make sure weight is recorded the same way. Essential hypertension  - continue Lisinopril 40 mg daily. Change lasix from IV to PO regimen today.  Morbid obesity, BMI > 50  - counseled on diet; requested to have  Leukocytosis  - no clear sings of na infectious etiology, has trace leukocytes on admission urinalysis but no urinary complaints. - likely from one dose of solumedrol 125 mg IV that pt has received in ED  - re sum steroids today due to more wheezing.  CKD, stage II - III / metabolic alkalosis - Cr remains at baseline  - metabolic alkalosis likely due to lasix  DVT Prophylaxis:   Heparin SQ    Code Status: Full.  Family Communication: plan of care discussed with the patient  Disposition Plan: Home when stable.    IV Access:   Peripheral IV  Procedures and diagnostic studies:    CXR 01/13/2014 Suspect mild interstitial edema/CHF. Low lung volumes with bibasilar atelectasis.   Medical Consultants:   None   Other Consultants:   None   Anti-Infectives:   None    Leisa Lenz, MD  [facility] Pager [phone]  If 7PM-7AM, please contact night-coverage [email or web address] Password  [email or web address] 01/15/2014, 2:21 PM   LOS: 2 days    HPI/Subjective: No acute overnight events.  Objective: Filed Vitals:   01/14/14 2101 01/15/14 0129 01/15/14 0459 01/15/14 0813  BP:   116/67   Pulse:  71 69   Temp:   97.8 F (36.6 C)   TempSrc:   Oral   Resp:  20 20   Height:      Weight:   200.581 kg (442 lb 3.2 oz)   SpO2: 97% 96% 97% 97%    Intake/Output Summary (Last 24 hours) at  01/15/14 1421 Last data filed at 01/15/14 0945  Gross per 24 hour  Intake   1080 ml  Output    700 ml  Net    380 ml    Exam:   General:  Pt is alert, morbidly obese, in distress due to shortness of breath   Cardiovascular: Regular rate and rhythm, S1/S2, no murmurs  Respiratory: wheezing audible, no rhonchi   Abdomen: Soft, non tender, non distended, bowel sounds present  Extremities: obese extremities with swelling, lymphedema, pulses DP and PT palpable bilaterally  Neuro: Grossly nonfocal  Data Reviewed: Basic Metabolic Panel:  Recent Labs Lab 01/13/14 2108 01/14/14 0512 01/15/14 0448  NA 145 144 141  K 4.5 4.0 4.6  CL 101 97 94*  CO2 34* 33* 38*  GLUCOSE 100* 208* 119*  BUN 16 20 21   CREATININE 1.20* 1.27* 1.32*  CALCIUM 8.8 9.0 8.6   Liver Function Tests:  Recent Labs Lab 01/13/14 2108 01/14/14 0512  AST 14 12  ALT 17 17  ALKPHOS 76 79  BILITOT 0.3 0.3  PROT 7.2 7.7  ALBUMIN 3.4* 3.6   No results found for this basename: LIPASE, AMYLASE,  in the last 168 hours No results found for this basename: AMMONIA,  in the last 168 hours CBC:  Recent Labs Lab 01/13/14 2240 01/14/14 0512 01/15/14 0448  WBC 9.4 10.6* 13.4*  NEUTROABS 8.2*  --   --   HGB 11.7* 12.6 12.2  HCT 39.9 42.7 42.2  MCV 94.8 94.7 96.8  PLT 210 247 263   Cardiac Enzymes:  Recent Labs Lab 01/13/14 2108  TROPONINI <0.30   BNP: No components found with this basename: POCBNP,  CBG:  Recent Labs Lab 01/14/14 0739 01/15/14 0728 01/15/14 1213  GLUCAP 157* 123* 167*    Recent Results (from the past 240 hour(s))  MRSA PCR SCREENING     Status: Abnormal   Collection Time    01/14/14  2:15 AM      Result Value Ref Range Status   MRSA by PCR POSITIVE (*) NEGATIVE Final     Scheduled Meds: . allopurinol  100 mg Oral q morning - 10a  . amitriptyline  75 mg Oral QHS  . aspirin  325 mg Oral q morning - 10a  . atorvastatin  20 mg Oral q morning - 10a  . clonazePAM  1  mg Oral BID  . darifenacin  7.5 mg Oral Daily  . dextromethorphan-guaiFENesin  1 tablet Oral BID   folic acid  1 mg Oral Daily  . furosemide  40 mg Oral BID  . heparin  5,000 Units Subcutaneous 3 times per day  . hydrocerin   Topical BID  . ipratropium-albuterol  3 mL Nebulization Q4H  . ketoconazole  200 mg Oral q morning - 10a  . lisinopril  40 mg Oral BID  . methylPREDNISolone   60 mg Intravenous 4 times per day  . multivitamin with  minerals  1 tablet Oral Daily  . mupirocin ointment  1 application Nasal BID  . pantoprazole  40 mg Oral q morning - 10a  . thiamine  100 mg Oral Daily  . vitamin E  100 Units Oral q morning - 10a

## 2014-01-15 NOTE — Progress Notes (Signed)
Pt refused her 12:00 neb tx. Pt stated she wants to eat. Pt in no distress at this time.

## 2014-01-15 NOTE — Progress Notes (Signed)
UR completed 

## 2014-01-15 NOTE — Progress Notes (Signed)
Nutrition Brief Note  Patient identified on the Malnutrition Screening Tool (MST) Report Pt has had a 15 lb weight gain x 4 months.  Wt Readings from Last 15 Encounters:  01/15/14 442 lb 3.2 oz (200.581 kg)  09/02/13 427 lb 8 oz (193.913 kg)  06/29/13 430 lb (195.047 kg)  11/21/12 430 lb (195.047 kg)  08/11/12 411 lb 9.6 oz (186.701 kg)  08/01/12 417 lb 12.4 oz (189.5 kg)  05/09/12 393 lb (178.264 kg)  03/23/12 436 lb 1.1 oz (197.8 kg)  03/17/12 407 lb 13.6 oz (185 kg)  12/27/11 401 lb 3.8 oz (182 kg)  08/27/11 423 lb 8 oz (192.098 kg)  05/19/11 429 lb 10.8 oz (194.9 kg)  09/04/10 411 lb 14.4 oz (186.837 kg)  08/29/10 415 lb 6.4 oz (188.424 kg)  08/22/10 414 lb (187.789 kg)    Body mass index is 73.59 kg/(m^2). Patient meets criteria for morbid obesity based on current BMI.   Current diet order is regular, patient is consuming approximately 100% of meals at this time. Labs and medications reviewed.   No nutrition interventions warranted at this time. If nutrition issues arise, please consult RD.   Clayton Bibles, MS, RD, LDN Pager: [phone] After Hours Pager: [phone]

## 2014-01-16 ENCOUNTER — Encounter (HOSPITAL_COMMUNITY): Payer: Self-pay | Admitting: Anesthesiology

## 2014-01-16 DIAGNOSIS — I5031 Acute diastolic (congestive) heart failure: Secondary | ICD-10-CM

## 2014-01-16 DIAGNOSIS — G9340 Encephalopathy, unspecified: Secondary | ICD-10-CM | POA: Diagnosis present

## 2014-01-16 DIAGNOSIS — J9601 Acute respiratory failure with hypoxia: Secondary | ICD-10-CM

## 2014-01-16 DIAGNOSIS — J9602 Acute respiratory failure with hypercapnia: Secondary | ICD-10-CM

## 2014-01-16 LAB — BLOOD GAS, ARTERIAL
Acid-Base Excess: 10.2 mmol/L — ABNORMAL HIGH (ref 0.0–2.0)
Bicarbonate: 41.8 mEq/L — ABNORMAL HIGH (ref 20.0–24.0)
Drawn by: 244801
Drawn by: 23588
O2 CONTENT: 4 L/min
O2 Content: 3 L/min
O2 SAT: 91.9 %
O2 Saturation: 89.1 %
PATIENT TEMPERATURE: 98.6
PO2 ART: 65.7 mmHg — AB (ref 80.0–100.0)
Patient temperature: 98.6
TCO2: 39.3 mmol/L (ref 0–100)
pCO2 arterial: 106 mmHg (ref 35.0–45.0)
pH, Arterial: 7.167 — CL (ref 7.350–7.450)
pH, Arterial: 7.221 — ABNORMAL LOW (ref 7.350–7.450)
pO2, Arterial: 68.8 mmHg — ABNORMAL LOW (ref 80.0–100.0)

## 2014-01-16 LAB — BASIC METABOLIC PANEL
Anion gap: 8 (ref 5–15)
BUN: 28 mg/dL — AB (ref 6–23)
CHLORIDE: 92 meq/L — AB (ref 96–112)
CO2: 40 meq/L — AB (ref 19–32)
Calcium: 8.4 mg/dL (ref 8.4–10.5)
Creatinine, Ser: 1.4 mg/dL — ABNORMAL HIGH (ref 0.50–1.10)
GFR calc non Af Amer: 43 mL/min — ABNORMAL LOW (ref >90)
GFR, EST AFRICAN AMERICAN: 50 mL/min — AB (ref >90)
GLUCOSE: 200 mg/dL — AB (ref 70–99)
POTASSIUM: 4.8 meq/L (ref 3.7–5.3)
Sodium: 140 mEq/L (ref 137–147)

## 2014-01-16 LAB — CBC
HEMATOCRIT: 44.1 % (ref 36.0–46.0)
Hemoglobin: 12.5 g/dL (ref 12.0–15.0)
MCH: 28.4 pg (ref 26.0–34.0)
MCHC: 28.3 g/dL — ABNORMAL LOW (ref 30.0–36.0)
MCV: 100.2 fL — AB (ref 78.0–100.0)
PLATELETS: 340 10*3/uL (ref 150–400)
RBC: 4.4 MIL/uL (ref 3.87–5.11)
RDW: 14.7 % (ref 11.5–15.5)
WBC: 17.5 10*3/uL — ABNORMAL HIGH (ref 4.0–10.5)

## 2014-01-16 LAB — GLUCOSE, CAPILLARY
Glucose-Capillary: 188 mg/dL — ABNORMAL HIGH (ref 70–99)
Glucose-Capillary: 236 mg/dL — ABNORMAL HIGH (ref 70–99)
Glucose-Capillary: 169 mg/dL — ABNORMAL HIGH (ref 70–99)
Glucose-Capillary: 174 mg/dL — ABNORMAL HIGH (ref 70–99)

## 2014-01-16 LAB — AMMONIA: Ammonia: 50 umol/L (ref 11–60)

## 2014-01-16 MED ORDER — FUROSEMIDE 10 MG/ML IJ SOLN
40.0000 mg | Freq: Two times a day (BID) | INTRAMUSCULAR | Status: DC
  Administered 2014-01-16 – 2014-01-20 (×9): 40 mg via INTRAVENOUS
  Filled 2014-01-16 (×11): qty 4

## 2014-01-16 NOTE — Progress Notes (Signed)
Rapid response called to patients room after patient was disoriented and very irritable. Very difficult to arouse and would not keep nasal cannula on. This was a change from baseline this am. Oxygen saturation on room air was 68%. Oxygen immediately resumed and came up to 93% on 3 L. Patient very wheezy in the upper airways. Rapid response at bedside and patient irritable. MD was notified immediately. Arterial blood gas ordered and results read back to MD. PH 7.16 and co2 and bicarb unreadable. Patient transferred to stepdown ICU.Orders placed by MD. Report given to Langley Gauss, RN in stepdown ICU.

## 2014-01-16 NOTE — Progress Notes (Addendum)
[facility] PROGRESS NOTE  Kara Vincent [id number] DOB: [birth year] DOA: 01/13/2014 PCP: Kara Lesch, MD Brief narrative 48 year old morbidly obese female with diastolic dysfunction, chronic kidney disease stage 2-3, hypertension, asthma, sleep apnea on CPAP at home was admitted on 01/12/2014 with acute respiratory failure associated with multifactorial causes including asthma exacerbation vs OSA/OHS versus acute diastolic CHF. Patient went into acute  hypercarbic respiratory failure on 10/26 requiring transfer to stepdown on BiPAP  Assessment/Plan:  Principal problem Acute hypoxic and hypercarbic respiratory failure Multifactorial in the setting of OSA/OHS, asthma exacerbation and and mild diastolic CHF Patient somnolent and irritable on  10/26  With  ABG showing PH of 7.16 and unmeasured CO2 and bicarbonate. Patient arousable and oriented with clinical signs of respiratory failure ( drowsiness and flapping tremors) . Transfer  to stepdown on BiPAP. Monitor closely for further respiratory compromise as patient is at high risk for being intubated. I will ask PCCM for further recommendations. -Seen actively wheezy on exam. Continue IV Solu-Medrol, nebs as tolerated.  I'll increase her Lasix to 40 mg IV twice daily. -Check CBC, chemistry and ammonia level. -Hold her Klonopin and amitriptyline.  Active problems Acute diastolic CHF Will switch to IV Lasix 40 mg twice daily.( 40 mg po bid at home) .  Monitor daily weight and I/O  Hypertension Continue home medications  Chronic kidney disease stage 2-3 Stable  morbid obesity with BMI > 50 Needs counseling on weight loss and exercise   Code Status: Full code Family Communication: none at bedside, will update family Disposition Plan: transfer to [facility]   Consultants:  none  Procedures:  none  Antibiotics:  none  HPI/Subjective: Patient seen and examined later this morning. She was sleepy but arousable  and diffusely wheezy on exam.  Was called by there nurse 2 hours later as she was disoriented and irritable. Stat arterial blood gas on 4 L via nasal cannula done showed pH of 7.16 with unrecordable PCO2 and bicarbonate . po2 of 65. Patient showing clinical signs of respiratory failure. Transfer to stepdown on BiPAP.  Objective: Filed Vitals:   01/16/14 0456  BP: 134/73  Pulse: 79  Temp: 98.3 F (36.8 C)  Resp: 20    Intake/Output Summary (Last 24 hours) at 01/16/14 1455 Last data filed at 01/16/14 1108  Gross per 24 hour  Intake      0 ml  Output   3225 ml  Net  -3225 ml   Filed Weights   01/14/14 0434 01/15/14 0459 01/16/14 0456  Weight: 199.129 kg (439 lb) 200.581 kg (442 lb 3.2 oz) 200.807 kg (442 lb 11.2 oz)    Exam:   General: Middle aged morbidly obese female lying in bed sleepy and lethargic but arousable to commands  HEENT: Moist oral mucosa  Chest: Diffuse wheezing bilaterally  CVS: Normal S1 and S2, no murmurs  Abdomen:obese, ND, NT  Extremities: Warm, no edema  CNS: Sleepy but arousable and  Irritable but  and answering questions appropriately, flapping tremors  Data Reviewed: Basic Metabolic Panel:  Recent Labs Lab 01/13/14 2108 01/14/14 0512 01/15/14 0448  NA 145 144 141  K 4.5 4.0 4.6  CL 101 97 94*  CO2 34* 33* 38*  GLUCOSE 100* 208* 119*  BUN 16 20 21   CREATININE 1.20* 1.27* 1.32*  CALCIUM 8.8 9.0 8.6   Liver Function Tests:  Recent Labs Lab 01/13/14 2108 01/14/14 0512  AST 14 12  ALT 17 17  ALKPHOS 76 79  BILITOT 0.3 0.3  PROT 7.2 7.7  ALBUMIN 3.4* 3.6   No results found for this basename: LIPASE, AMYLASE,  in the last 168 hours No results found for this basename: AMMONIA,  in the last 168 hours CBC:  Recent Labs Lab 01/13/14 2240 01/14/14 0512 01/15/14 0448  WBC 9.4 10.6* 13.4*  NEUTROABS 8.2*  --   --   HGB 11.7* 12.6 12.2  HCT 39.9 42.7 42.2  MCV 94.8 94.7 96.8  PLT 210 247 263   Cardiac Enzymes:  Recent  Labs Lab 01/13/14 2108  TROPONINI <0.30   BNP (last 3 results)  Recent Labs  08/30/13 2206 09/19/13 2002 01/13/14 2108  PROBNP 584.4* 102.3 541.0*   CBG:  Recent Labs Lab 01/15/14 1213 01/15/14 1705 01/15/14 2128 01/16/14 0744 01/16/14 1219  GLUCAP 167* 151* 157* 188* 236*    Recent Results (from the past 240 hour(s))  MRSA PCR SCREENING     Status: Abnormal   Collection Time    01/14/14  2:15 AM      Result Value Ref Range Status   MRSA by PCR POSITIVE (*) NEGATIVE Final   Comment:            The GeneXpert MRSA Assay (FDA     approved for NASAL specimens     only), is one component of a     comprehensive MRSA colonization     surveillance program. It is not     intended to diagnose MRSA     infection nor to guide or     monitor treatment for     MRSA infections.     RESULT CALLED TO, READ BACK BY AND VERIFIED WITH:     L.VERGELDEDIOS,RN AT 0407 ON 10.24.15 BY SHEA.W     Studies: No results found.  Scheduled Meds: . allopurinol  100 mg Oral q morning - 10a  . amitriptyline  75 mg Oral QHS  . antiseptic oral rinse  7 mL Mouth Rinse BID  . aspirin  325 mg Oral q morning - 10a  . atorvastatin  20 mg Oral q morning - 10a  . Chlorhexidine Gluconate Cloth  6 each Topical Q0600  . clonazePAM  1 mg Oral BID  . darifenacin  7.5 mg Oral Daily  . dextromethorphan-guaiFENesin  1 tablet Oral BID  . folic acid  1 mg Oral Daily  . furosemide  40 mg Oral BID  . heparin  5,000 Units Subcutaneous 3 times per day  . hydrocerin   Topical BID  . ipratropium-albuterol  3 mL Nebulization Q4H  . ketoconazole  200 mg Oral q morning - 10a  . lisinopril  40 mg Oral BID  . methylPREDNISolone (SOLU-MEDROL) injection  60 mg Intravenous 4 times per day  . multivitamin with minerals  1 tablet Oral Daily  . mupirocin ointment  1 application Nasal BID  . pantoprazole  40 mg Oral q morning - 10a  . sodium chloride  3 mL Intravenous Q12H  . sodium chloride  3 mL Intravenous Q12H   . thiamine  100 mg Oral Daily  . vitamin E  100 Units Oral q morning - 10a   Continuous Infusions:     Time spent: 35 minutes    Kara Vincent  [facility] Pager [phone] If 7PM-7AM, please contact night-coverage at [email or web address], password [facility] 01/16/2014, 2:55 PM  LOS: 3 days

## 2014-01-16 NOTE — Consult Note (Addendum)
PULMONARY / CRITICAL CARE MEDICINE   Name: Kara Vincent MRN: [id number] DOB: 07-Sep-1965    ADMISSION DATE:  01/13/2014 CONSULTATION DATE:  01/16/2014  REFERRING MD :  Dhungel  CHIEF COMPLAINT:  Respiratory distress  INITIAL PRESENTATION: 48 year old morbidly obese woman with OSA and obesity hypoventilation, requiring BiPAP for acute hypercarbic respiratory failure  STUDIES:  08/2013 echo-normal LV function, normal diastolic parameters, mildly dilated RV  SIGNIFICANT EVENTS: 10/26 PCCM consult   HISTORY OF PRESENT ILLNESS:  A 48 year old morbidly obese woman admitted 10/23 with several days of dyspnea, orthopnea, lower extremity swelling and wheezing. Impression on admission was acute diastolic CHF. On day 2, increased bronchospasm was noted and she was started on IV Solu-Medrol. She diuresed well with Lasix about 3 L negative. On day 3, she developed increased respiratory distress, arterial blood showed 7.17/PCO2 was too high to measure She was transferred to the ICU and placed on a BiPAP On my arrival, she was mentating well-awake and responsive, follows all commands. BiPAP was on 17/8 with good tidal volumes of 500 mL, mask was poor fitting, fullface, with some leak   PAST MEDICAL HISTORY :   has a past medical history of Asthma; GERD (gastroesophageal reflux disease); Hypertension; Homelessness; Low back pain; Darier disease; Arthritis; Gout; Hyperlipemia; Morbid obesity; MRSA (methicillin resistant Staphylococcus aureus); and CHF (congestive heart failure).  has past surgical history that includes Tubal ligation and Cesarean section. Prior to Admission medications   Medication Sig Start Date End Date Taking? Authorizing Provider  albuterol (PROVENTIL HFA) 108 (90 BASE) MCG/ACT inhaler Inhale 2 puffs into the lungs every 4 (four) hours as needed for wheezing or shortness of breath.    Yes Historical Provider, MD  albuterol (PROVENTIL) (2.5 MG/3ML) 0.083% nebulizer solution Take  2.5 mg by nebulization every 6 (six) hours as needed for wheezing or shortness of breath.   Yes Historical Provider, MD  allopurinol (ZYLOPRIM) 100 MG tablet Take 100 mg by mouth every morning.    Yes Historical Provider, MD  amitriptyline (ELAVIL) 75 MG tablet Take 75 mg by mouth at bedtime.   Yes Historical Provider, MD  ammonium lactate (AMLACTIN) 12 % cream Apply 1 g topically daily as needed for dry skin.    Yes Historical Provider, MD  aspirin 325 MG tablet Take 325 mg by mouth every morning.    Yes Historical Provider, MD  atorvastatin (LIPITOR) 20 MG tablet Take 20 mg by mouth every morning.    Yes Historical Provider, MD  clonazePAM (KLONOPIN) 1 MG tablet Take 1 mg by mouth 2 (two) times daily.   Yes Historical Provider, MD  diphenhydrAMINE (BENADRYL) 25 mg capsule Take 25 mg by mouth every 6 (six) hours as needed for itching or allergies.    Yes Historical Provider, MD  Emollient (EUCERIN) lotion Apply 5 mLs topically 2 (two) times daily as needed for dry skin.    Yes Historical Provider, MD  furosemide (LASIX) 40 MG tablet Take 40 mg by mouth 2 (two) times daily.   Yes Historical Provider, MD  guaiFENesin (MUCINEX) 600 MG 12 hr tablet Take 600 mg by mouth 2 (two) times daily.   Yes Historical Provider, MD  HYDROcodone-acetaminophen (NORCO) 10-325 MG per tablet Take 1 tablet by mouth every 6 (six) hours as needed for pain.   Yes Historical Provider, MD  hydrOXYzine (ATARAX/VISTARIL) 25 MG tablet Take 25 mg by mouth 3 (three) times daily as needed for anxiety or itching.   Yes Historical Provider, MD  ketoconazole (NIZORAL) 2 %  shampoo Apply 1 application topically 2 (two) times a week.   Yes Historical Provider, MD  ketoconazole (NIZORAL) 200 MG tablet Take 200 mg by mouth every morning.    Yes Historical Provider, MD  lisinopril (PRINIVIL,ZESTRIL) 40 MG tablet Take 40 mg by mouth 2 (two) times daily.    Yes Historical Provider, MD  naproxen (NAPROSYN) 500 MG tablet Take 500 mg by mouth 2  (two) times daily with a meal.   Yes Historical Provider, MD  neomycin-polymyxin-dexamethasone (MAXITROL) 0.1 % ophthalmic suspension Apply 2 drops to eye every 4 (four) hours.   Yes Historical Provider, MD  omega-3 acid ethyl esters (LOVAZA) 1 G capsule Take 1 g by mouth 2 (two) times daily.    Yes Historical Provider, MD  pantoprazole (PROTONIX) 40 MG tablet Take 40 mg by mouth every morning.    Yes Historical Provider, MD  promethazine (PHENERGAN) 25 MG tablet Take 25 mg by mouth every 6 (six) hours as needed for nausea.    Yes Historical Provider, MD  solifenacin (VESICARE) 5 MG tablet Take 5 mg by mouth every morning.    Yes Historical Provider, MD  vitamin B-12 (CYANOCOBALAMIN) 1000 MCG tablet Take 1,000 mcg by mouth every morning.    Yes Historical Provider, MD  vitamin E 100 UNIT capsule Take 100 Units by mouth every morning.   Yes Historical Provider, MD   Allergies  Allergen Reactions  . Ibuprofen Hives, Shortness Of Breath and Palpitations  . Adhesive [Tape] Rash  . Azithromycin Hives and Rash  . Doxycycline Rash  . Penicillins Rash  . Sulfa Antibiotics Rash  . Sulfamethoxazole-Trimethoprim Rash    FAMILY HISTORY:  has no family status information on file.  SOCIAL HISTORY:  reports that she quit smoking about 10 years ago. Her smoking use included Cigarettes. She smoked 0.00 packs per day. She quit smokeless tobacco use about 35 years ago. She reports that she does not drink alcohol or use illicit drugs.  REVIEW OF SYSTEMS:  Unable to obtain while on bipap, as noted on prior notes  SUBJECTIVE:   VITAL SIGNS: Temp:  [97.6 F (36.4 C)-98.3 F (36.8 C)] 97.6 F (36.4 C) (10/26 1500) Pulse Rate:  [75-95] 95 (10/26 1500) Resp:  [16-26] 26 (10/26 1500) BP: (134-141)/(63-105) 141/105 mmHg (10/26 1500) SpO2:  [74 %-96 %] 89 % (10/26 1504) Weight:  [200.807 kg (442 lb 11.2 oz)] 200.807 kg (442 lb 11.2 oz) (10/26 0456) HEMODYNAMICS:   VENTILATOR SETTINGS:   INTAKE /  OUTPUT:  Intake/Output Summary (Last 24 hours) at 01/16/14 1605 Last data filed at 01/16/14 1108  Gross per 24 hour  Intake      0 ml  Output   3225 ml  Net  -3225 ml    PHYSICAL EXAMINATION: Gen. Morbidly obese, in mild distress, depressed affect ENT - no lesions, no post nasal drip, class III airway Neck: No JVD, no thyromegaly, no carotid bruits Lungs: no use of accessory muscles, no dullness to percussion, decreased with faint bilateral scattered rhonchi  Cardiovascular: Rhythm regular, heart sounds  normal, no murmurs, 1+ peripheral edema Abdomen: soft and non-tender, no hepatosplenomegaly, BS normal. Musculoskeletal: No deformities, no cyanosis or clubbing Neuro:  Awake, lethargic but easily aroused, non focal Skin:  Warm, no lesions/ rash   LABS:  CBC  Recent Labs Lab 01/14/14 0512 01/15/14 0448 01/16/14 1459  WBC 10.6* 13.4* 17.5*  HGB 12.6 12.2 12.5  HCT 42.7 42.2 44.1  PLT 247 263 340   Coag's No results  found for this basename: APTT, INR,  in the last 168 hours BMET  Recent Labs Lab 01/14/14 0512 01/15/14 0448 01/16/14 1459  NA 144 141 140  K 4.0 4.6 4.8  CL 97 94* 92*  CO2 33* 38* 40*  BUN 20 21 28*  CREATININE 1.27* 1.32* 1.40*  GLUCOSE 208* 119* 200*   Electrolytes  Recent Labs Lab 01/14/14 0512 01/15/14 0448 01/16/14 1459  CALCIUM 9.0 8.6 8.4   Sepsis Markers No results found for this basename: LATICACIDVEN, PROCALCITON, O2SATVEN,  in the last 168 hours ABG  Recent Labs Lab 01/16/14 1356  PHART 7.167*  PCO2ART ABOVE REPORTABLE RANGE.  PO2ART 65.7*   Liver Enzymes  Recent Labs Lab 01/13/14 2108 01/14/14 0512  AST 14 12  ALT 17 17  ALKPHOS 76 79  BILITOT 0.3 0.3  ALBUMIN 3.4* 3.6   Cardiac Enzymes  Recent Labs Lab 01/13/14 2108  TROPONINI <0.30  PROBNP 541.0*   Glucose  Recent Labs Lab 01/15/14 0728 01/15/14 1213 01/15/14 1705 01/15/14 2128 01/16/14 0744 01/16/14 1219  GLUCAP 123* 167* 151* 157* 188*  236*    Imaging No results found.   ASSESSMENT / PLAN:  PULMONARY  A: Acute hypercarbic respiratory failure OSA/OHS Doubt true asthma P:   BiPAP 17/8 I called the sleep lab and asked the sleep tech to bring over a better fitting fullface mask. Repeat ABG in 1 hour  IV Solu-Medrol 60 every 8 once she has bronchospasm  CARDIOVASCULAR  A: Acute diastolic CHF P:  Continue Lasix  RENAL A:  CK D P:   Monitor Dc lisinopril  GASTROINTESTINAL A:  Morbid obesity P:   Nothing by mouth while on BiPAP   ENDOCRINE A:  Hyperglycemia, steroid-induced   P:   SSI  NEUROLOGIC A:  Acute encephalopathy P:   RASS goal: 0 Hold sedating meds such as clonazepam   TODAY'S SUMMARY: Acute respiratory acidosis, due to decompensated obesity hypoventilation, with diastolic heart failure. Hope that if we can get a better seal on her BiPAP mask, we can ventilate her better.  I have personally obtained a history, examined the patient, evaluated laboratory and imaging results, formulated the assessment and plan and placed orders. CRITICAL CARE: The patient is critically ill with multiple organ systems failure and requires high complexity decision making for assessment and support, frequent evaluation and titration of therapies, application of advanced monitoring technologies and extensive interpretation of multiple databases. Critical Care Time devoted to patient care services described in this note is 45 minutes.    Kara Mead MD. Shade Flood. [facility] Pager [phone] If no response call [phone]    01/16/2014, 4:05 PM

## 2014-01-17 DIAGNOSIS — G4733 Obstructive sleep apnea (adult) (pediatric): Secondary | ICD-10-CM

## 2014-01-17 LAB — BLOOD GAS, ARTERIAL
Acid-Base Excess: 15.9 mmol/L — ABNORMAL HIGH (ref 0.0–2.0)
Acid-Base Excess: 18.8 mmol/L — ABNORMAL HIGH (ref 0.0–2.0)
Allens test (pass/fail): POSITIVE — AB
BICARBONATE: 48.1 meq/L — AB (ref 20.0–24.0)
Bicarbonate: 46.1 mEq/L — ABNORMAL HIGH (ref 20.0–24.0)
DRAWN BY: 422461
Delivery systems: POSITIVE
Drawn by: 23588
Expiratory PAP: 8
FIO2: 0.4 %
Inspiratory PAP: 18
LHR: 16 {breaths}/min
MODE: POSITIVE
O2 Content: 4 L/min
O2 SAT: 90.7 %
O2 Saturation: 92.8 %
PATIENT TEMPERATURE: 98.1
PH ART: 7.316 — AB (ref 7.350–7.450)
PO2 ART: 61.8 mmHg — AB (ref 80.0–100.0)
Patient temperature: 98.6
TCO2: 42.4 mmol/L (ref 0–100)
TCO2: 43.7 mmol/L (ref 0–100)
pCO2 arterial: 92.6 mmHg (ref 35.0–45.0)
pCO2 arterial: 84.5 mmHg (ref 35.0–45.0)
pH, Arterial: 7.374 (ref 7.350–7.450)
pO2, Arterial: 67.5 mmHg — ABNORMAL LOW (ref 80.0–100.0)

## 2014-01-17 LAB — GLUCOSE, CAPILLARY
GLUCOSE-CAPILLARY: 161 mg/dL — AB (ref 70–99)
GLUCOSE-CAPILLARY: 169 mg/dL — AB (ref 70–99)
Glucose-Capillary: 159 mg/dL — ABNORMAL HIGH (ref 70–99)
Glucose-Capillary: 181 mg/dL — ABNORMAL HIGH (ref 70–99)

## 2014-01-17 MED ORDER — METHYLPREDNISOLONE SODIUM SUCC 125 MG IJ SOLR
60.0000 mg | Freq: Two times a day (BID) | INTRAMUSCULAR | Status: DC
  Administered 2014-01-17 – 2014-01-18 (×2): 60 mg via INTRAVENOUS
  Filled 2014-01-17 (×2): qty 2

## 2014-01-17 MED ORDER — ENOXAPARIN SODIUM 100 MG/ML ~~LOC~~ SOLN
100.0000 mg | SUBCUTANEOUS | Status: DC
  Administered 2014-01-17 – 2014-01-20 (×4): 100 mg via SUBCUTANEOUS
  Filled 2014-01-17 (×5): qty 1

## 2014-01-17 MED ORDER — IPRATROPIUM-ALBUTEROL 0.5-2.5 (3) MG/3ML IN SOLN
3.0000 mL | Freq: Four times a day (QID) | RESPIRATORY_TRACT | Status: DC
  Administered 2014-01-17 – 2014-01-21 (×15): 3 mL via RESPIRATORY_TRACT
  Filled 2014-01-17 (×15): qty 3

## 2014-01-17 NOTE — Progress Notes (Signed)
PULMONARY / CRITICAL CARE MEDICINE   Name: Kara Vincent MRN: [id number] DOB: 1965/11/13    ADMISSION DATE:  01/13/2014 CONSULTATION DATE:  01/17/2014  REFERRING MD :  Dhungel  CHIEF COMPLAINT:  Respiratory distress  INITIAL PRESENTATION: 48 year old morbidly obese woman with OSA and obesity hypoventilation, requiring BiPAP for acute hypercarbic respiratory failure  STUDIES:  08/2013 echo-normal LV function, normal diastolic parameters, mildly dilated RV  SIGNIFICANT EVENTS: 10/26 PCCM consult 10/27 remains on NIMVS  HISTORY OF PRESENT ILLNESS:  A 48 year old morbidly obese woman admitted 10/23 with several days of dyspnea, orthopnea, lower extremity swelling and wheezing. Impression on admission was acute diastolic CHF. On day 2, increased bronchospasm was noted and she was started on IV Solu-Medrol. She diuresed well with Lasix about 3 L negative. On day 3, she developed increased respiratory distress, arterial blood showed 7.17/PCO2 was too high to measure She was transferred to the ICU and placed on a BiPAP  SUBJECTIVE:  Remains on NIMVS NAD  VITAL SIGNS: Temp:  [97.5 F (36.4 C)-98.3 F (36.8 C)] 98.2 F (36.8 C) (10/27 0600) Pulse Rate:  [65-95] 68 (10/27 0600) Resp:  [13-26] 15 (10/27 0600) BP: (110-175)/(52-109) 121/78 mmHg (10/27 0600) SpO2:  [74 %-95 %] 90 % (10/27 0600) FiO2 (%):  [35 %-40 %] 35 % (10/27 0820) Weight:  [439 lb 9.6 oz (199.4 kg)] 439 lb 9.6 oz (199.4 kg) (10/27 0400) HEMODYNAMICS:   VENTILATOR SETTINGS: Vent Mode:  [-]  FiO2 (%):  [35 %-40 %] 35 % INTAKE / OUTPUT:  Intake/Output Summary (Last 24 hours) at 01/17/14 0831 Last data filed at 01/17/14 0400  Gross per 24 hour  Intake     93 ml  Output   2700 ml  Net  -2607 ml    PHYSICAL EXAMINATION: Gen. Morbidly obese, nodistress, sleeping ENT - FM in place Neck: No JVD, no thyromegaly, no carotid bruits Lungs: diminished bs thru out  Cardiovascular: Rhythm regular, heart sounds   normal, no murmurs, 1+ peripheral edema Abdomen: soft and non-tender, no hepatosplenomegaly, BS normal. Musculoskeletal: No deformities, no cyanosis or clubbing Neuro:  Awake, lethargic but easily aroused, non focal Skin:  Warm, no lesions/ rash   LABS:  CBC  Recent Labs Lab 01/14/14 0512 01/15/14 0448 01/16/14 1459  WBC 10.6* 13.4* 17.5*  HGB 12.6 12.2 12.5  HCT 42.7 42.2 44.1  PLT 247 263 340   Coag's No results found for this basename: APTT, INR,  in the last 168 hours BMET  Recent Labs Lab 01/14/14 0512 01/15/14 0448 01/16/14 1459  NA 144 141 140  K 4.0 4.6 4.8  CL 97 94* 92*  CO2 33* 38* 40*  BUN 20 21 28*  CREATININE 1.27* 1.32* 1.40*  GLUCOSE 208* 119* 200*   Electrolytes  Recent Labs Lab 01/14/14 0512 01/15/14 0448 01/16/14 1459  CALCIUM 9.0 8.6 8.4   Sepsis Markers No results found for this basename: LATICACIDVEN, PROCALCITON, O2SATVEN,  in the last 168 hours ABG  Recent Labs Lab 01/16/14 1356 01/16/14 1755 01/17/14 0501  PHART 7.167* 7.221* 7.316*  PCO2ART ABOVE REPORTABLE RANGE. 106.0* 92.6*  PO2ART 65.7* 68.8* 67.5*   Liver Enzymes  Recent Labs Lab 01/13/14 2108 01/14/14 0512  AST 14 12  ALT 17 17  ALKPHOS 76 79  BILITOT 0.3 0.3  ALBUMIN 3.4* 3.6   Cardiac Enzymes  Recent Labs Lab 01/13/14 2108  TROPONINI <0.30  PROBNP 541.0*   Glucose  Recent Labs Lab 01/15/14 1705 01/15/14 2128 01/16/14 0744 01/16/14 1219 01/16/14  1612 01/16/14 2121  GLUCAP 151* 157* 188* 236* 169* 174*    Imaging No results found.   ASSESSMENT / PLAN:  PULMONARY  A: Acute hypercarbic respiratory failure OSA/OHS Doubt true asthma P:   BiPAP 17/8  IV Solu-Medrol 60 every 12 h (decreased 10/27) Follow abg's pH (ordered for 12 n 10/27) Suspect she needs tracheostomy unless she is able to improve compliance with home NIPPV Avoid all sedating medications   CARDIOVASCULAR  A: Acute diastolic CHF P:  Continue Lasix as  ordered  RENAL A:  Acute on CKD P:   Monitor Dc lisinopril for now, consider restart when stabilized but note potential contribution to UA disease  GASTROINTESTINAL A:  Morbid obesity P:   Restart Diet with decreased biPAP needs   ENDOCRINE A:  Hyperglycemia, steroid-induced   P:   SSI  NEUROLOGIC A:  Acute encephalopathy P:   RASS goal: 0 Hold sedating meds such as clonazepam   TODAY'S SUMMARY: Acute respiratory acidosis, due to decompensated obesity hypoventilation, with diastolic heart failure. Better on bipap but may need intubation and tracheostomy due to MO.  [name] Minor ACNP [name] PCCM Pager [phone] till 3 pm If no answer page [phone] 01/17/2014, 8:38 AM  [name], MD, PhD 01/17/2014, 12:51 PM [facility] [phone] or if no answer [phone]

## 2014-01-17 NOTE — Progress Notes (Signed)
[facility] PROGRESS NOTE  Kara Vincent [id number] DOB: 29-Aug-1965 DOA: 01/13/2014 PCP: Jearld Lesch, MD  Assessment/Plan: Acute hypoxic and hypercarbic respiratory failure  -Multifactorial in the setting of OSA/OHS, asthma exacerbation and and mild diastolic CHF  -Pt noted to have increased respiratory distress on 10/26 and transferred to sdu for bipap -PCCM following -PCO2 improving but still in the mid-80's  -Pt is continued on IV Solu-Medrol, nebs as tolerated. Increased Lasix to 40 mg IV twice daily.  -Possible needs trach in the future Acute diastolic CHF  On IV Lasix 40 mg twice daily.( 40 mg po bid at home) . Monitor daily weight and I/O  Hypertension  Continue home medications  Chronic kidney disease stage 2-3  Stable  morbid obesity with BMI > 50  Needs counseling on weight loss and exercise  Code Status: Full Family Communication: Pt in room Disposition Plan: Pending   Consultants:  PCCM  Procedures:    Antibiotics:    HPI/Subjective: Feels back at baseline self. Aggravated today because she wants to eat.  Objective: Filed Vitals:   01/17/14 0700 01/17/14 0800 01/17/14 0900 01/17/14 1000  BP: 144/105 156/104 135/100 148/97  Pulse: 72 63 68 73  Temp: 98.1 F (36.7 C) 98.2 F (36.8 C) 98.4 F (36.9 C) 98.6 F (37 C)  TempSrc:  Core (Comment)    Resp: 23 19 20 19   Height:      Weight:      SpO2: 92% 91% 89% 89%    Intake/Output Summary (Last 24 hours) at 01/17/14 1212 Last data filed at 01/17/14 1000  Gross per 24 hour  Intake     93 ml  Output   3600 ml  Net  -3507 ml   Filed Weights   01/15/14 0459 01/16/14 0456 01/17/14 0400  Weight: 200.581 kg (442 lb 3.2 oz) 200.807 kg (442 lb 11.2 oz) 199.4 kg (439 lb 9.6 oz)    Exam:   General:  Awake, in nad  Cardiovascular: regular, s1, s2  Respiratory: normal resp effort, no wheezing  Abdomen: morbidly obese, soft, nondistended  Musculoskeletal: perfused, no  clubbing   Data Reviewed: Basic Metabolic Panel:  Recent Labs Lab 01/13/14 2108 01/14/14 0512 01/15/14 0448 01/16/14 1459  NA 145 144 141 140  K 4.5 4.0 4.6 4.8  CL 101 97 94* 92*  CO2 34* 33* 38* 40*  GLUCOSE 100* 208* 119* 200*  BUN 16 20 21  28*  CREATININE 1.20* 1.27* 1.32* 1.40*  CALCIUM 8.8 9.0 8.6 8.4   Liver Function Tests:  Recent Labs Lab 01/13/14 2108 01/14/14 0512  AST 14 12  ALT 17 17  ALKPHOS 76 79  BILITOT 0.3 0.3  PROT 7.2 7.7  ALBUMIN 3.4* 3.6   No results found for this basename: LIPASE, AMYLASE,  in the last 168 hours  Recent Labs Lab 01/16/14 1459  AMMONIA 50   CBC:  Recent Labs Lab 01/13/14 2240 01/14/14 0512 01/15/14 0448 01/16/14 1459  WBC 9.4 10.6* 13.4* 17.5*  NEUTROABS 8.2*  --   --   --   HGB 11.7* 12.6 12.2 12.5  HCT 39.9 42.7 42.2 44.1  MCV 94.8 94.7 96.8 100.2*  PLT 210 247 263 340   Cardiac Enzymes:  Recent Labs Lab 01/13/14 2108  TROPONINI <0.30   BNP (last 3 results)  Recent Labs  08/30/13 2206 09/19/13 2002 01/13/14 2108  PROBNP 584.4* 102.3 541.0*   CBG:  Recent Labs Lab 01/16/14 0744 01/16/14 1219 01/16/14 1612 01/16/14 2121 01/17/14 0818  GLUCAP 188* 236* 169* 174* 159*    Recent Results (from the past 240 hour(s))  MRSA PCR SCREENING     Status: Abnormal   Collection Time    01/14/14  2:15 AM      Result Value Ref Range Status   MRSA by PCR POSITIVE (*) NEGATIVE Final   Comment:            The GeneXpert MRSA Assay (FDA     approved for NASAL specimens     only), is one component of a     comprehensive MRSA colonization     surveillance program. It is not     intended to diagnose MRSA     infection nor to guide or     monitor treatment for     MRSA infections.     RESULT CALLED TO, READ BACK BY AND VERIFIED WITH:     L.[email or web address],RN AT 0407 ON 10.24.15 BY SHEA.W     Studies: No results found.  Scheduled Meds: . allopurinol  100 mg Oral q morning - 10a  . antiseptic oral  rinse  7 mL Mouth Rinse BID  . aspirin  325 mg Oral q morning - 10a  . atorvastatin  20 mg Oral q morning - 10a  . Chlorhexidine Gluconate Cloth  6 each Topical Q0600  . darifenacin  7.5 mg Oral Daily  . dextromethorphan-guaiFENesin  1 tablet Oral BID  . folic acid  1 mg Oral Daily  . furosemide  40 mg Intravenous BID  . heparin  5,000 Units Subcutaneous 3 times per day  . hydrocerin   Topical BID  . ipratropium-albuterol  3 mL Nebulization Q6H  . ketoconazole  200 mg Oral q morning - 10a  . methylPREDNISolone (SOLU-MEDROL) injection  60 mg Intravenous Q12H  . multivitamin with minerals  1 tablet Oral Daily  . mupirocin ointment  1 application Nasal BID  . pantoprazole  40 mg Oral q morning - 10a  . sodium chloride  3 mL Intravenous Q12H  . sodium chloride  3 mL Intravenous Q12H  . thiamine  100 mg Oral Daily  . vitamin E  100 Units Oral q morning - 10a   Continuous Infusions:   Principal Problem:   Acute respiratory failure with hypoxia and hypercarbia Active Problems:   Obstructive sleep apnea   Essential hypertension   CHF (congestive heart failure)   Acute respiratory failure   Acute encephalopathy  Time spent:  Wallie Lagrand K  [facility] Pager [phone]. If 7PM-7AM, please contact night-coverage at [email or web address], password [facility] 01/17/2014, 12:12 PM  LOS: 4 days

## 2014-01-18 ENCOUNTER — Encounter: Payer: Self-pay | Admitting: Emergency Medicine

## 2014-01-18 DIAGNOSIS — I5032 Chronic diastolic (congestive) heart failure: Secondary | ICD-10-CM

## 2014-01-18 DIAGNOSIS — J9602 Acute respiratory failure with hypercapnia: Secondary | ICD-10-CM

## 2014-01-18 LAB — GLUCOSE, CAPILLARY
GLUCOSE-CAPILLARY: 151 mg/dL — AB (ref 70–99)
Glucose-Capillary: 154 mg/dL — ABNORMAL HIGH (ref 70–99)
Glucose-Capillary: 216 mg/dL — ABNORMAL HIGH (ref 70–99)
Glucose-Capillary: 147 mg/dL — ABNORMAL HIGH (ref 70–99)

## 2014-01-18 LAB — CBC
HCT: 39.8 % (ref 36.0–46.0)
Hemoglobin: 11.8 g/dL — ABNORMAL LOW (ref 12.0–15.0)
MCH: 28.2 pg (ref 26.0–34.0)
MCHC: 29.6 g/dL — ABNORMAL LOW (ref 30.0–36.0)
MCV: 95.2 fL (ref 78.0–100.0)
PLATELETS: 196 10*3/uL (ref 150–400)
RBC: 4.18 MIL/uL (ref 3.87–5.11)
RDW: 14.4 % (ref 11.5–15.5)
WBC: 14.4 10*3/uL — AB (ref 4.0–10.5)

## 2014-01-18 LAB — BASIC METABOLIC PANEL
BUN: 38 mg/dL — AB (ref 6–23)
CHLORIDE: 87 meq/L — AB (ref 96–112)
CO2: >45 mEq/L (ref 19–32)
Calcium: 8.1 mg/dL — ABNORMAL LOW (ref 8.4–10.5)
Creatinine, Ser: 1.3 mg/dL — ABNORMAL HIGH (ref 0.50–1.10)
GFR, EST AFRICAN AMERICAN: 55 mL/min — AB (ref >90)
GFR, EST NON AFRICAN AMERICAN: 47 mL/min — AB (ref >90)
Glucose, Bld: 155 mg/dL — ABNORMAL HIGH (ref 70–99)
POTASSIUM: 4.9 meq/L (ref 3.7–5.3)
SODIUM: 143 meq/L (ref 137–147)

## 2014-01-18 MED ORDER — METOCLOPRAMIDE HCL 5 MG/ML IJ SOLN
10.0000 mg | Freq: Once | INTRAMUSCULAR | Status: AC
  Administered 2014-01-18: 10 mg via INTRAVENOUS
  Filled 2014-01-18: qty 2

## 2014-01-18 MED ORDER — CHLORHEXIDINE GLUCONATE 0.12 % MT SOLN
15.0000 mL | Freq: Two times a day (BID) | OROMUCOSAL | Status: DC
  Administered 2014-01-18 – 2014-01-23 (×10): 15 mL via OROMUCOSAL
  Filled 2014-01-18 (×13): qty 15

## 2014-01-18 MED ORDER — CETYLPYRIDINIUM CHLORIDE 0.05 % MT LIQD: 7.0000 mL | Freq: Two times a day (BID) | OROMUCOSAL | Status: DC

## 2014-01-18 MED ORDER — STERILE WATER FOR INJECTION IJ SOLN
INTRAMUSCULAR | Status: AC
  Administered 2014-01-18: 12:00:00
  Filled 2014-01-18: qty 10

## 2014-01-18 MED ORDER — DIPHENHYDRAMINE HCL 50 MG/ML IJ SOLN
25.0000 mg | Freq: Once | INTRAMUSCULAR | Status: AC
  Administered 2014-01-18: 25 mg via INTRAVENOUS
  Filled 2014-01-18: qty 1

## 2014-01-18 MED ORDER — ACETAZOLAMIDE SODIUM 500 MG IJ SOLR
250.0000 mg | Freq: Two times a day (BID) | INTRAMUSCULAR | Status: AC
  Administered 2014-01-18 – 2014-01-19 (×4): 250 mg via INTRAVENOUS
  Filled 2014-01-18 (×5): qty 500

## 2014-01-18 NOTE — Progress Notes (Signed)
[facility] PROGRESS NOTE  Kara Vincent [id number] DOB: 1965/11/26 DOA: 01/13/2014 PCP: Harvie Junior, MD  Assessment/Plan: Acute hypoxic and hypercarbic respiratory failure  -Multifactorial in the setting of OSA/OHS, asthma exacerbation and and mild diastolic CHF -Pt noted to have increased respiratory distress on 10/26 and transferred to sdu for bipap -PCCM following -PCO2 overall improved, but still high -Pt is continued on IV Solu-Medrol, nebs as tolerated.  -Increased Lasix to 40 mg IV twice daily -Recs for Diamox per Pulm -Possible needs trach in the future Acute diastolic CHF  -On IV Lasix 40 mg twice daily.( 40 mg po bid at home) . Monitor daily weight and I/O  Hypertension  Continue home medications  Chronic kidney disease stage 2-3  Stable  morbid obesity with BMI > 50  Needs counseling on weight loss and exercise  Code Status: Full Family Communication: Pt in room Disposition Plan: Pending   Consultants:  PCCM  Procedures:    Antibiotics:    HPI/Subjective: No acute events noted overnight. Wants to eat this AM  Objective: Filed Vitals:   01/18/14 0811 01/18/14 0900 01/18/14 1600 01/18/14 1636  BP:  146/101  115/62  Pulse: 63 81    Temp: 98.2 F (36.8 C) 98.6 F (37 C) 98.6 F (37 C)   TempSrc:   Core (Comment)   Resp: 17 24  24   Height:      Weight:      SpO2: 93% 87%  95%    Intake/Output Summary (Last 24 hours) at 01/18/14 1748 Last data filed at 01/18/14 1500  Gross per 24 hour  Intake    123 ml  Output   5250 ml  Net  -5127 ml   Filed Weights   01/16/14 0456 01/17/14 0400 01/18/14 0400  Weight: 200.807 kg (442 lb 11.2 oz) 199.4 kg (439 lb 9.6 oz) 204 kg (449 lb 11.8 oz)    Exam:   General:  Awake, in nad  Cardiovascular: regular, s1, s2  Respiratory: normal resp effort, no wheezing  Abdomen: morbidly obese, soft, nondistended  Musculoskeletal: perfused, no clubbing   Data Reviewed: Basic Metabolic  Panel:  Recent Labs Lab 01/13/14 2108 01/14/14 0512 01/15/14 0448 01/16/14 1459 01/18/14 0314  NA 145 144 141 140 143  K 4.5 4.0 4.6 4.8 4.9  CL 101 97 94* 92* 87*  CO2 34* 33* 38* 40* >45*  GLUCOSE 100* 208* 119* 200* 155*  BUN 16 20 21  28* 38*  CREATININE 1.20* 1.27* 1.32* 1.40* 1.30*  CALCIUM 8.8 9.0 8.6 8.4 8.1*   Liver Function Tests:  Recent Labs Lab 01/13/14 2108 01/14/14 0512  AST 14 12  ALT 17 17  ALKPHOS 76 79  BILITOT 0.3 0.3  PROT 7.2 7.7  ALBUMIN 3.4* 3.6   No results found for this basename: LIPASE, AMYLASE,  in the last 168 hours  Recent Labs Lab 01/16/14 1459  AMMONIA 50   CBC:  Recent Labs Lab 01/13/14 2240 01/14/14 0512 01/15/14 0448 01/16/14 1459 01/18/14 0314  WBC 9.4 10.6* 13.4* 17.5* 14.4*  NEUTROABS 8.2*  --   --   --   --   HGB 11.7* 12.6 12.2 12.5 11.8*  HCT 39.9 42.7 42.2 44.1 39.8  MCV 94.8 94.7 96.8 100.2* 95.2  PLT 210 247 263 340 196   Cardiac Enzymes:  Recent Labs Lab 01/13/14 2108  TROPONINI <0.30   BNP (last 3 results)  Recent Labs  08/30/13 2206 09/19/13 2002 01/13/14 2108  PROBNP 584.4* 102.3 541.0*  CBG:  Recent Labs Lab 01/17/14 1637 01/17/14 2239 01/18/14 0757 01/18/14 1221 01/18/14 1707  GLUCAP 169* 181* 154* 216* 151*    Recent Results (from the past 240 hour(s))  MRSA PCR SCREENING     Status: Abnormal   Collection Time    01/14/14  2:15 AM      Result Value Ref Range Status   MRSA by PCR POSITIVE (*) NEGATIVE Final   Comment:            The GeneXpert MRSA Assay (FDA     approved for NASAL specimens     only), is one component of a     comprehensive MRSA colonization     surveillance program. It is not     intended to diagnose MRSA     infection nor to guide or     monitor treatment for     MRSA infections.     RESULT CALLED TO, READ BACK BY AND VERIFIED WITH:     L.VERGELDEDIOS,RN AT 0407 ON 10.24.15 BY SHEA.W     Studies: No results found.  Scheduled Meds: .  acetaZOLAMIDE  250 mg Intravenous Q12H  . allopurinol  100 mg Oral q morning - 10a  . antiseptic oral rinse  7 mL Mouth Rinse BID  . aspirin  325 mg Oral q morning - 10a  . atorvastatin  20 mg Oral q morning - 10a  . chlorhexidine  15 mL Mouth Rinse BID  . darifenacin  7.5 mg Oral Daily  . dextromethorphan-guaiFENesin  1 tablet Oral BID  . enoxaparin (LOVENOX) injection  100 mg Subcutaneous Q24H  . folic acid  1 mg Oral Daily  . furosemide  40 mg Intravenous BID  . hydrocerin   Topical BID  . ipratropium-albuterol  3 mL Nebulization Q6H  . multivitamin with minerals  1 tablet Oral Daily  . mupirocin ointment  1 application Nasal BID  . pantoprazole  40 mg Oral q morning - 10a  . sodium chloride  3 mL Intravenous Q12H  . thiamine  100 mg Oral Daily  . vitamin E  100 Units Oral q morning - 10a   Continuous Infusions:   Principal Problem:   Acute respiratory failure with hypoxia and hypercarbia Active Problems:   Obstructive sleep apnea   Essential hypertension   CHF (congestive heart failure)   Acute respiratory failure   Acute encephalopathy  Time spent: 66min  [facility], [place] Pager [phone]. If 7PM-7AM, please contact night-coverage at [email or web address], password [facility] 01/18/2014, 5:48 PM  LOS: 5 days

## 2014-01-18 NOTE — Progress Notes (Signed)
CRITICAL VALUE ALERT  Critical value received: CO2 >45  Date of notification:  01/18/14  Time of notification:  0400  Critical value read back:Yes.    Nurse who received alert:  Charmayne Sheer  MD notified (1st page):  Deterding  Time of first page:  0430  MD notified (2nd page):  Time of second page:  Responding MD:  Deterding  Time MD responded:  [id number]

## 2014-01-18 NOTE — Care Management Note (Signed)
  Page 2 of 2   01/18/2014     9:19:49 AM CARE MANAGEMENT NOTE 01/18/2014  Patient:  Kara Vincent, Kara Vincent   Account Number:  [id number]  Date Initiated:  01/18/2014  Documentation initiated by:  Wynonia Medero  Subjective/Objective Assessment:   pt transferred from med surg floor to sdu due to resp distress pm of [id number] patient on Fi02 at 50% and Bipap     Action/Plan:   tbd   Anticipated DC Date:  01/21/2014   Anticipated DC Plan:  HOME/SELF CARE  In-house referral  NA      DC Planning Services  CM consult      PAC Choice  NA   Choice offered to / List presented to:  NA   DME arranged  NA      DME agency  NA     [place] arranged  NA      [place] agency  NA   Status of service:  In process, will continue to follow Medicare Important Message given?   (If response is "NO", the following Medicare IM given date fields will be blank) Date Medicare IM given:   Medicare IM given by:   Date Additional Medicare IM given:   Additional Medicare IM given by:    Discharge Disposition:    Per UR Regulation:  Reviewed for med. necessity/level of care/duration of stay  If discussed at [place] of Stay Meetings, dates discussed:    Comments:  [id number]/Lasandra Batley Rosana Hoes, RN, BSN, CCM Chart reviewed. Discharge needs and patient's stay to be reviewed and followed by case manager. chart note: TODAY'S SUMMARY: Acute respiratory acidosis, due to decompensated obesity hypoventilation, with diastolic heart failure. Diamox in attempt to lower pco2 to lower level. Still may need trach infuture.

## 2014-01-18 NOTE — Progress Notes (Signed)
PULMONARY / CRITICAL CARE MEDICINE   Name: Kara Vincent MRN: [id number] DOB: [birth year]    ADMISSION DATE:  01/13/2014 CONSULTATION DATE:  01/18/2014  REFERRING MD :  Dhungel  CHIEF COMPLAINT:  Respiratory distress  INITIAL PRESENTATION: 48 year old morbidly obese woman with OSA and obesity hypoventilation, requiring BiPAP for acute hypercarbic respiratory failure  STUDIES:  08/2013 echo-normal LV function, normal diastolic parameters, mildly dilated RV  SIGNIFICANT EVENTS: 10/26 PCCM consult 10/27 remains on NIMVS 10/28 4 doses of Diamox to attempt to resent Co2 at lower level  HISTORY OF PRESENT ILLNESS:  A 48 year old morbidly obese woman admitted 10/23 with several days of dyspnea, orthopnea, lower extremity swelling and wheezing. Impression on admission was acute diastolic CHF. On day 2, increased bronchospasm was noted and she was started on IV Solu-Medrol. She diuresed well with Lasix about 3 L negative. On day 3, she developed increased respiratory distress, arterial blood showed 7.17/PCO2 was too high to measure She was transferred to the ICU and placed on a BiPAP  SUBJECTIVE:  NAD off NIMVS  VITAL SIGNS: Temp:  [98.1 F (36.7 C)-99.1 F (37.3 C)] 98.2 F (36.8 C) (10/28 0811) Pulse Rate:  [59-91] 63 (10/28 0811) Resp:  [14-30] 17 (10/28 0811) BP: (127-173)/(69-136) 141/86 mmHg (10/28 0600) SpO2:  [88 %-97 %] 93 % (10/28 0811) FiO2 (%):  [50 %] 50 % (10/28 0400) Weight:  [449 lb 11.8 oz (204 kg)] 449 lb 11.8 oz (204 kg) (10/28 0400) HEMODYNAMICS:   VENTILATOR SETTINGS: Vent Mode:  [-]  FiO2 (%):  [50 %] 50 % INTAKE / OUTPUT:  Intake/Output Summary (Last 24 hours) at 01/18/14 0852 Last data filed at 01/18/14 0600  Gross per 24 hour  Intake    120 ml  Output   3475 ml  Net  -3355 ml    PHYSICAL EXAMINATION: Gen. Morbidly obese, nodistress, awake Neck: No JVD, no thyromegaly, no carotid bruits Lungs: diminished bs thru out  Cardiovascular: Rhythm  regular, heart sounds  normal, no murmurs, 1+ peripheral edema Abdomen: soft , huge and non-tender, no hepatosplenomegaly, BS normal. Musculoskeletal: No deformities, no cyanosis or clubbing Neuro:  Awake, alert, wants food Skin:  Warm, no lesions/ rash   LABS:  CBC  Recent Labs Lab 01/15/14 0448 01/16/14 1459 01/18/14 0314  WBC 13.4* 17.5* 14.4*  HGB 12.2 12.5 11.8*  HCT 42.2 44.1 39.8  PLT 263 340 196   Coag's No results found for this basename: APTT, INR,  in the last 168 hours BMET  Recent Labs Lab 01/15/14 0448 01/16/14 1459 01/18/14 0314  NA 141 140 143  K 4.6 4.8 4.9  CL 94* 92* 87*  CO2 38* 40* >45*  BUN 21 28* 38*  CREATININE 1.32* 1.40* 1.30*  GLUCOSE 119* 200* 155*   Electrolytes  Recent Labs Lab 01/15/14 0448 01/16/14 1459 01/18/14 0314  CALCIUM 8.6 8.4 8.1*   Sepsis Markers No results found for this basename: LATICACIDVEN, PROCALCITON, O2SATVEN,  in the last 168 hours ABG  Recent Labs Lab 01/16/14 1755 01/17/14 0501 01/17/14 1150  PHART 7.221* 7.316* 7.374  PCO2ART 106.0* 92.6* 84.5*  PO2ART 68.8* 67.5* 61.8*   Liver Enzymes  Recent Labs Lab 01/13/14 2108 01/14/14 0512  AST 14 12  ALT 17 17  ALKPHOS 76 79  BILITOT 0.3 0.3  ALBUMIN 3.4* 3.6   Cardiac Enzymes  Recent Labs Lab 01/13/14 2108  TROPONINI <0.30  PROBNP 541.0*   Glucose  Recent Labs Lab 01/16/14 2121 01/17/14 0818 01/17/14 1203 01/17/14 1637  01/17/14 2239 01/18/14 0757  GLUCAP 174* 159* 161* 169* 181* 154*    Intake/Output Summary (Last 24 hours) at 01/18/14 0855 Last data filed at 01/18/14 0600  Gross per 24 hour  Intake    120 ml  Output   3475 ml  Net  -3355 ml    Imaging No results found.   ASSESSMENT / PLAN:  PULMONARY  A: Acute hypercarbic respiratory failure OSA/OHS Doubt true asthma P:   BiPAP 17/8, will plan to increase EPAP on 12/28 D/c IV Solu-Medrol 10/28 Suspect she needs tracheostomy unless she is able to improve  compliance with home NIPPV Avoid all sedating medications 4 doses of diamox in attempt to reset Pco2 at lower level Nocturnal NIMVS   CARDIOVASCULAR  A: Acute diastolic CHF P:  Continue Lasix as ordered  RENAL A:  Acute on CKD P:   Monitor Dc lisinopril for now, consider restart when stabilized but note potential contribution to UA disease  GASTROINTESTINAL A:  Morbid obesity P:   Restart Diet with decreased biPAP needs   ENDOCRINE CBG (last 3)   Recent Labs  01/17/14 1637 01/17/14 2239 01/18/14 0757  GLUCAP 169* 181* 154*     A:  Hyperglycemia, steroid-induced   P:   SSI  NEUROLOGIC A:  Acute encephalopathy P:   RASS goal: 0 Hold sedating meds such as clonazepam   TODAY'S SUMMARY: Acute respiratory acidosis, due to decompensated obesity hypoventilation, with diastolic heart failure. Diamox in attempt to lower pco2 to lower level. Still may need trach in future.  Kara Vincent Minor ACNP Kara Vincent PCCM Pager [phone] till 3 pm If no answer page [phone] 01/18/2014, 8:52 AM   Kara Apo, MD, PhD 01/18/2014, 11:06 AM [facility] [phone] or if no answer [phone]

## 2014-01-19 DIAGNOSIS — N189 Chronic kidney disease, unspecified: Secondary | ICD-10-CM

## 2014-01-19 DIAGNOSIS — N179 Acute kidney failure, unspecified: Secondary | ICD-10-CM

## 2014-01-19 LAB — BASIC METABOLIC PANEL
ANION GAP: 7 (ref 5–15)
BUN: 45 mg/dL — AB (ref 6–23)
CO2: 42 mEq/L (ref 19–32)
Calcium: 8.6 mg/dL (ref 8.4–10.5)
Chloride: 91 mEq/L — ABNORMAL LOW (ref 96–112)
Creatinine, Ser: 1.35 mg/dL — ABNORMAL HIGH (ref 0.50–1.10)
GFR calc Af Amer: 52 mL/min — ABNORMAL LOW (ref >90)
GFR, EST NON AFRICAN AMERICAN: 45 mL/min — AB (ref >90)
Glucose, Bld: 121 mg/dL — ABNORMAL HIGH (ref 70–99)
POTASSIUM: 4.4 meq/L (ref 3.7–5.3)
Sodium: 140 mEq/L (ref 137–147)

## 2014-01-19 LAB — BLOOD GAS, ARTERIAL
Acid-Base Excess: 14.1 mmol/L — ABNORMAL HIGH (ref 0.0–2.0)
Bicarbonate: 41.9 mEq/L — ABNORMAL HIGH (ref 20.0–24.0)
DRAWN BY: 331471
O2 CONTENT: 5 L/min
O2 SAT: 95.8 %
Patient temperature: 98.6
TCO2: 37.5 mmol/L (ref 0–100)
pCO2 arterial: 69.3 mmHg (ref 35.0–45.0)
pH, Arterial: 7.399 (ref 7.350–7.450)
pO2, Arterial: 80.5 mmHg (ref 80.0–100.0)

## 2014-01-19 LAB — GLUCOSE, CAPILLARY
GLUCOSE-CAPILLARY: 177 mg/dL — AB (ref 70–99)
GLUCOSE-CAPILLARY: 132 mg/dL — AB (ref 70–99)
Glucose-Capillary: 145 mg/dL — ABNORMAL HIGH (ref 70–99)
Glucose-Capillary: 109 mg/dL — ABNORMAL HIGH (ref 70–99)

## 2014-01-19 MED ORDER — INSULIN ASPART 100 UNIT/ML ~~LOC~~ SOLN: 0.0000 [IU] | Freq: Every day | SUBCUTANEOUS | Status: DC

## 2014-01-19 MED ORDER — INSULIN ASPART 100 UNIT/ML ~~LOC~~ SOLN: 0.0000 [IU] | Freq: Three times a day (TID) | SUBCUTANEOUS | Status: DC

## 2014-01-19 MED ORDER — ACETAMINOPHEN 325 MG PO TABS
650.0000 mg | ORAL_TABLET | Freq: Four times a day (QID) | ORAL | Status: DC | PRN
  Administered 2014-01-19 – 2014-01-21 (×4): 650 mg via ORAL
  Filled 2014-01-19 (×4): qty 2

## 2014-01-19 NOTE — Plan of Care (Signed)
Problem: Phase II Progression Outcomes Goal: Pain controlled Outcome: Progressing No narcotic medications ordered at this time due to risk of respiratory depression.

## 2014-01-19 NOTE — Progress Notes (Signed)
Pt CBG was elevated at 1200 and pt refused insulin coverage. Also, pt required BiPap in afternoon because she was napping and destating. Bipap at 40 given and pt rested comfortably for about 2 hours. Then, pt asked that Bipap be removed. RN removed Bipap and gave pt 5L [place]. Pt stating in low 90s on nasal canula when awake.

## 2014-01-19 NOTE — Progress Notes (Signed)
PULMONARY / CRITICAL CARE MEDICINE   Name: Kara Vincent MRN: [id number] DOB: December 20, 1965    ADMISSION DATE:  01/13/2014 CONSULTATION DATE:  01/19/2014  REFERRING MD :  Dhungel  CHIEF COMPLAINT:  Respiratory distress  INITIAL PRESENTATION: 48 year old morbidly obese woman with OSA and obesity hypoventilation, requiring BiPAP for acute hypercarbic respiratory failure  STUDIES:  08/2013 echo-normal LV function, normal diastolic parameters, mildly dilated RV  SIGNIFICANT EVENTS: 10/26 PCCM consult 10/27 remains on NIMVS 10/28 4 doses of Diamox to attempt to resent Co2 at lower level  HISTORY OF PRESENT ILLNESS:  A 48 year old morbidly obese woman admitted 10/23 with several days of dyspnea, orthopnea, lower extremity swelling and wheezing. Impression on admission was acute diastolic CHF. On day 2, increased bronchospasm was noted and she was started on IV Solu-Medrol. She diuresed well with Lasix about 3 L negative. On day 3, she developed increased respiratory distress, arterial blood showed 7.17/PCO2 was too high to measure She was transferred to the ICU and placed on a BiPAP  SUBJECTIVE:  NAD off NIMVS, sitting in chair eating  VITAL SIGNS: Temp:  [97.3 F (36.3 C)-99.1 F (37.3 C)] 97.3 F (36.3 C) (10/29 0700) Pulse Rate:  [58-82] 71 (10/29 0700) Resp:  [14-24] 19 (10/29 0700) BP: (115-148)/(62-90) 137/81 mmHg (10/29 0700) SpO2:  [88 %-97 %] 88 % (10/29 0700) FiO2 (%):  [50 %] 50 % (10/29 0419) HEMODYNAMICS:   VENTILATOR SETTINGS: Vent Mode:  [-]  FiO2 (%):  [50 %] 50 % INTAKE / OUTPUT:  Intake/Output Summary (Last 24 hours) at 01/19/14 0927 Last data filed at 01/19/14 0600  Gross per 24 hour  Intake      6 ml  Output   6401 ml  Net  -6395 ml    PHYSICAL EXAMINATION: Gen. Morbidly obese, no distress, awake, eating in chair Neck: No JVD, no thyromegaly, no carotid bruits Lungs: diminished bs thru out  Cardiovascular: Rhythm regular, heart sounds  normal, no  murmurs, 1+ peripheral edema Abdomen: soft , huge and non-tender, no hepatosplenomegaly, BS normal. Musculoskeletal: No deformities, no cyanosis or clubbing Neuro:  Awake, alert, wants food Skin:  Warm, no lesions/ rash   LABS:  CBC  Recent Labs Lab 01/15/14 0448 01/16/14 1459 01/18/14 0314  WBC 13.4* 17.5* 14.4*  HGB 12.2 12.5 11.8*  HCT 42.2 44.1 39.8  PLT 263 340 196   Coag's No results found for this basename: APTT, INR,  in the last 168 hours BMET  Recent Labs Lab 01/16/14 1459 01/18/14 0314 01/19/14 0335  NA 140 143 140  K 4.8 4.9 4.4  CL 92* 87* 91*  CO2 40* >45* 42*  BUN 28* 38* 45*  CREATININE 1.40* 1.30* 1.35*  GLUCOSE 200* 155* 121*   Electrolytes  Recent Labs Lab 01/16/14 1459 01/18/14 0314 01/19/14 0335  CALCIUM 8.4 8.1* 8.6   Sepsis Markers No results found for this basename: LATICACIDVEN, PROCALCITON, O2SATVEN,  in the last 168 hours ABG  Recent Labs Lab 01/16/14 1755 01/17/14 0501 01/17/14 1150  PHART 7.221* 7.316* 7.374  PCO2ART 106.0* 92.6* 84.5*  PO2ART 68.8* 67.5* 61.8*   Liver Enzymes  Recent Labs Lab 01/13/14 2108 01/14/14 0512  AST 14 12  ALT 17 17  ALKPHOS 76 79  BILITOT 0.3 0.3  ALBUMIN 3.4* 3.6   Cardiac Enzymes  Recent Labs Lab 01/13/14 2108  TROPONINI <0.30  PROBNP 541.0*   Glucose  Recent Labs Lab 01/17/14 2239 01/18/14 0757 01/18/14 1221 01/18/14 1707 01/18/14 2114 01/19/14 0845  GLUCAP  181* 154* 216* 151* 147* 177*    Intake/Output Summary (Last 24 hours) at 01/19/14 0927 Last data filed at 01/19/14 0600  Gross per 24 hour  Intake      6 ml  Output   6401 ml  Net  -6395 ml    Imaging No results found.   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 P:  Continue Lasix as ordered until her S Cr bumps > 1.4, then would change her to her usual home dosing  RENAL A:  Acute on CKD P:   Monitor Dc lisinopril for now, consider restart when stabilized but note potential contribution to UA disease  GASTROINTESTINAL A:  Morbid obesity P:   Heart healthy diet (she is asking for this to be changed to regular - I explained to her why I would NOT do this)   ENDOCRINE A:  Hyperglycemia, steroid-induced   P:   SSI  NEUROLOGIC A:  Acute encephalopathy(resolved) P:   RASS goal: 0 to 1 Hold sedating meds such as clonazepam   TODAY'S SUMMARY: Acute respiratory acidosis, due to decompensated obesity hypoventilation, cor pulmonale, with diastolic heart failure. Diamox x 2 days in attempt to lower pco2 to lower level in setting aggressive diuresis. Still may need trach in future.  At this point PCCM can see prn. Please call when we can assist.   [name] Minor ACNP [name] PCCM Pager [phone] till 3 pm If no answer page [phone] 01/19/2014, 9:27 AM  [name], MD, PhD 01/19/2014, 12:38 PM [facility] [phone] or if no answer [phone]

## 2014-01-19 NOTE — Progress Notes (Signed)
[facility] PROGRESS NOTE  Kara Vincent [id number] DOB: 1965/12/07 DOA: 01/13/2014 PCP: Kara Lesch, MD  Assessment/Plan: Acute hypoxic and hypercarbic respiratory failure  -Multifactorial in the setting of OSA/OHS, asthma exacerbation and and mild diastolic CHF -Pt noted to have increased respiratory distress on 10/26 and transferred to sdu for bipap -PCCM had been following -PCO2 slowly improving.  -Pt is continued on IV Solu-Medrol, nebs as tolerated.  -Increased Lasix to 40 mg IV twice daily -Recs for Diamox per Pulm -Possible needs trach in the future Acute diastolic CHF  -On IV Lasix 40 mg twice daily.( 40 mg po bid at home) . Monitor daily weight and I/O  Hypertension  Continue home medications  Chronic kidney disease stage 2-3  Stable  morbid obesity with BMI > 50  Needs counseling on weight loss and exercise  Code Status: Full Family Communication: Pt in room Disposition Plan: Pending   Consultants:  PCCM  Procedures:    Antibiotics:  none  HPI/Subjective: Pt is without events noted overnight. Did wear Bipap at night, had refused wearing this AM.  Objective: Filed Vitals:   01/19/14 0900 01/19/14 1000 01/19/14 1100 01/19/14 1221  BP: 137/103 108/89 120/102   Pulse: 86 73 91   Temp: 98.2 F (36.8 C) 98.2 F (36.8 C) 98.2 F (36.8 C)   TempSrc:      Resp: 20 21 15    Height:      Weight:      SpO2: 95% 90% 92% 92%    Intake/Output Summary (Last 24 hours) at 01/19/14 1248 Last data filed at 01/19/14 1100  Gross per 24 hour  Intake      3 ml  Output   5752 ml  Net  -5749 ml   Filed Weights   01/16/14 0456 01/17/14 0400 01/18/14 0400  Weight: 200.807 kg (442 lb 11.2 oz) 199.4 kg (439 lb 9.6 oz) 204 kg (449 lb 11.8 oz)    Exam:   General:  Awake, in nad  Cardiovascular: regular, s1, s2  Respiratory: normal resp effort, no wheezing  Abdomen: morbidly obese, soft, nondistended  Musculoskeletal: perfused, no  clubbing   Data Reviewed: Basic Metabolic Panel:  Recent Labs Lab 01/14/14 0512 01/15/14 0448 01/16/14 1459 01/18/14 0314 01/19/14 0335  NA 144 141 140 143 140  K 4.0 4.6 4.8 4.9 4.4  CL 97 94* 92* 87* 91*  CO2 33* 38* 40* >45* 42*  GLUCOSE 208* 119* 200* 155* 121*  BUN 20 21 28* 38* 45*  CREATININE 1.27* 1.32* 1.40* 1.30* 1.35*  CALCIUM 9.0 8.6 8.4 8.1* 8.6   Liver Function Tests:  Recent Labs Lab 01/13/14 2108 01/14/14 0512  AST 14 12  ALT 17 17  ALKPHOS 76 79  BILITOT 0.3 0.3  PROT 7.2 7.7  ALBUMIN 3.4* 3.6   No results found for this basename: LIPASE, AMYLASE,  in the last 168 hours  Recent Labs Lab 01/16/14 1459  AMMONIA 50   CBC:  Recent Labs Lab 01/13/14 2240 01/14/14 0512 01/15/14 0448 01/16/14 1459 01/18/14 0314  WBC 9.4 10.6* 13.4* 17.5* 14.4*  NEUTROABS 8.2*  --   --   --   --   HGB 11.7* 12.6 12.2 12.5 11.8*  HCT 39.9 42.7 42.2 44.1 39.8  MCV 94.8 94.7 96.8 100.2* 95.2  PLT 210 247 263 340 196   Cardiac Enzymes:  Recent Labs Lab 01/13/14 2108  TROPONINI <0.30   BNP (last 3 results)  Recent Labs  08/30/13 2206 09/19/13 2002 01/13/14  2108  PROBNP 584.4* 102.3 541.0*   CBG:  Recent Labs Lab 01/18/14 0757 01/18/14 1221 01/18/14 1707 01/18/14 2114 01/19/14 0845  GLUCAP 154* 216* 151* 147* 177*    Recent Results (from the past 240 hour(s))  MRSA PCR SCREENING     Status: Abnormal   Collection Time    01/14/14  2:15 AM      Result Value Ref Range Status   MRSA by PCR POSITIVE (*) NEGATIVE Final   Comment:            The GeneXpert MRSA Assay (FDA     approved for NASAL specimens     only), is one component of a     comprehensive MRSA colonization     surveillance program. It is not     intended to diagnose MRSA     infection nor to guide or     monitor treatment for     MRSA infections.     RESULT CALLED TO, READ BACK BY AND VERIFIED WITH:     L.VERGELDEDIOS,RN AT 0407 ON 10.24.15 BY SHEA.W     Studies: No  results found.  Scheduled Meds: . acetaZOLAMIDE  250 mg Intravenous Q12H  . allopurinol  100 mg Oral q morning - 10a  . antiseptic oral rinse  7 mL Mouth Rinse BID  . aspirin  325 mg Oral q morning - 10a  . atorvastatin  20 mg Oral q morning - 10a  . chlorhexidine  15 mL Mouth Rinse BID  . darifenacin  7.5 mg Oral Daily  . dextromethorphan-guaiFENesin  1 tablet Oral BID  . enoxaparin (LOVENOX) injection  100 mg Subcutaneous Q24H  . folic acid  1 mg Oral Daily  . furosemide  40 mg Intravenous BID  . hydrocerin   Topical BID  . insulin aspart  0-20 Units Subcutaneous TID WC  . insulin aspart  0-5 Units Subcutaneous QHS  . ipratropium-albuterol  3 mL Nebulization Q6H  . multivitamin with minerals  1 tablet Oral Daily  . pantoprazole  40 mg Oral q morning - 10a  . sodium chloride  3 mL Intravenous Q12H  . thiamine  100 mg Oral Daily  . vitamin E  100 Units Oral q morning - 10a   Continuous Infusions:   Principal Problem:   Acute respiratory failure with hypoxia and hypercarbia Active Problems:   Obstructive sleep apnea   Essential hypertension   CHF (congestive heart failure)   Acute respiratory failure   Acute encephalopathy  Time spent:  Kara Vincent K  [facility] Pager [phone]. If 7PM-7AM, please contact night-coverage at [email or web address], password [facility] 01/19/2014, 12:48 PM  LOS: 6 days

## 2014-01-20 LAB — CBC
HCT: 43.9 % (ref 36.0–46.0)
Hemoglobin: 13.2 g/dL (ref 12.0–15.0)
MCH: 27.9 pg (ref 26.0–34.0)
MCHC: 30.1 g/dL (ref 30.0–36.0)
MCV: 92.8 fL (ref 78.0–100.0)
PLATELETS: 196 10*3/uL (ref 150–400)
RBC: 4.73 MIL/uL (ref 3.87–5.11)
RDW: 14.7 % (ref 11.5–15.5)
WBC: 16.2 10*3/uL — ABNORMAL HIGH (ref 4.0–10.5)

## 2014-01-20 LAB — BASIC METABOLIC PANEL
ANION GAP: 8 (ref 5–15)
BUN: 46 mg/dL — ABNORMAL HIGH (ref 6–23)
CHLORIDE: 89 meq/L — AB (ref 96–112)
CO2: 40 mEq/L (ref 19–32)
CREATININE: 1.46 mg/dL — AB (ref 0.50–1.10)
Calcium: 8.8 mg/dL (ref 8.4–10.5)
GFR calc non Af Amer: 41 mL/min — ABNORMAL LOW (ref >90)
GFR, EST AFRICAN AMERICAN: 48 mL/min — AB (ref >90)
Glucose, Bld: 124 mg/dL — ABNORMAL HIGH (ref 70–99)
Potassium: 4.4 mEq/L (ref 3.7–5.3)
Sodium: 137 mEq/L (ref 137–147)

## 2014-01-20 LAB — GLUCOSE, CAPILLARY
Glucose-Capillary: 133 mg/dL — ABNORMAL HIGH (ref 70–99)
Glucose-Capillary: 132 mg/dL — ABNORMAL HIGH (ref 70–99)

## 2014-01-20 MED ORDER — KETOROLAC TROMETHAMINE 15 MG/ML IJ SOLN
15.0000 mg | Freq: Four times a day (QID) | INTRAMUSCULAR | Status: DC | PRN
  Administered 2014-01-21 – 2014-01-22 (×5): 15 mg via INTRAVENOUS
  Filled 2014-01-20 (×5): qty 1

## 2014-01-20 MED ORDER — KETOROLAC TROMETHAMINE 15 MG/ML IJ SOLN
15.0000 mg | Freq: Once | INTRAMUSCULAR | Status: AC
  Administered 2014-01-20: 15 mg via INTRAVENOUS
  Filled 2014-01-20: qty 1

## 2014-01-20 NOTE — Progress Notes (Signed)
[facility] PROGRESS NOTE  CORENNA FORNSHELL [id number] DOB: Nov 28, 1965 DOA: 01/13/2014 PCP: Harvie Junior, MD  Assessment/Plan: Acute hypoxic and hypercarbic respiratory failure  -Multifactorial in the setting of OSA/OHS, asthma exacerbation and and mild diastolic CHF -Pt noted to have increased respiratory distress on 10/26 and transferred to sdu for bipap -PCCM had been following -PCO2 slowly improving.  -Pt is continued on nebs as tolerated. -Increased Lasix to 40 mg IV twice daily -On Diamox per Pulm recs -Possible needs trach in the future -Pt has been noncompliant with BiPAP on the floor Acute diastolic CHF  -On IV Lasix 40 mg twice daily.( 40 mg po bid at home) . Monitor daily weight and I/O  Hypertension  Continue home medications  Chronic kidney disease stage 2-3  Stable  morbid obesity with BMI > 50  Needs counseling on weight loss and exercise  Code Status: Full Family Communication: Pt in room Disposition Plan: Pending   Consultants:  PCCM  Procedures:    Antibiotics:  none  HPI/Subjective: Pt is eager to leave ICU. No acute events noted. Per staff, pt has been demanding graham crackers and ice cream overnight  Objective: Filed Vitals:   01/20/14 0500 01/20/14 0600 01/20/14 0616 01/20/14 0700  BP: 135/76 122/78  139/59  Pulse: 79 74 74 71  Temp: 98.1 F (36.7 C) 98.6 F (37 C) 98.4 F (36.9 C) 98.4 F (36.9 C)  TempSrc:   Core (Comment)   Resp:  18 20 18   Height:      Weight:      SpO2: 100% 96% 94% 95%    Intake/Output Summary (Last 24 hours) at 01/20/14 0812 Last data filed at 01/20/14 0616  Gross per 24 hour  Intake    720 ml  Output   5351 ml  Net  -4631 ml   Filed Weights   01/16/14 0456 01/17/14 0400 01/18/14 0400  Weight: 200.807 kg (442 lb 11.2 oz) 199.4 kg (439 lb 9.6 oz) 204 kg (449 lb 11.8 oz)    Exam:   General:  Awake, in nad  Cardiovascular: regular, s1, s2  Respiratory: normal resp effort, no  wheezing  Abdomen: morbidly obese, soft, nondistended  Musculoskeletal: perfused, no clubbing   Data Reviewed: Basic Metabolic Panel:  Recent Labs Lab 01/15/14 0448 01/16/14 1459 01/18/14 0314 01/19/14 0335 01/20/14 0335  NA 141 140 143 140 137  K 4.6 4.8 4.9 4.4 4.4  CL 94* 92* 87* 91* 89*  CO2 38* 40* >45* 42* 40*  GLUCOSE 119* 200* 155* 121* 124*  BUN 21 28* 38* 45* 46*  CREATININE 1.32* 1.40* 1.30* 1.35* 1.46*  CALCIUM 8.6 8.4 8.1* 8.6 8.8   Liver Function Tests:  Recent Labs Lab 01/13/14 2108 01/14/14 0512  AST 14 12  ALT 17 17  ALKPHOS 76 79  BILITOT 0.3 0.3  PROT 7.2 7.7  ALBUMIN 3.4* 3.6   No results found for this basename: LIPASE, AMYLASE,  in the last 168 hours  Recent Labs Lab 01/16/14 1459  AMMONIA 50   CBC:  Recent Labs Lab 01/13/14 2240 01/14/14 0512 01/15/14 0448 01/16/14 1459 01/18/14 0314 01/20/14 0335  WBC 9.4 10.6* 13.4* 17.5* 14.4* 16.2*  NEUTROABS 8.2*  --   --   --   --   --   HGB 11.7* 12.6 12.2 12.5 11.8* 13.2  HCT 39.9 42.7 42.2 44.1 39.8 43.9  MCV 94.8 94.7 96.8 100.2* 95.2 92.8  PLT 210 247 263 340 196 196   Cardiac Enzymes:  Recent Labs Lab 01/13/14 2108  TROPONINI <0.30   BNP (last 3 results)  Recent Labs  08/30/13 2206 09/19/13 2002 01/13/14 2108  PROBNP 584.4* 102.3 541.0*   CBG:  Recent Labs Lab 01/18/14 2114 01/19/14 0845 01/19/14 1304 01/19/14 1612 01/19/14 2146  GLUCAP 147* 177* 145* 109* 132*    Recent Results (from the past 240 hour(s))  MRSA PCR SCREENING     Status: Abnormal   Collection Time    01/14/14  2:15 AM      Result Value Ref Range Status   MRSA by PCR POSITIVE (*) NEGATIVE Final   Comment:            The GeneXpert MRSA Assay (FDA     approved for NASAL specimens     only), is one component of a     comprehensive MRSA colonization     surveillance program. It is not     intended to diagnose MRSA     infection nor to guide or     monitor treatment for     MRSA  infections.     RESULT CALLED TO, READ BACK BY AND VERIFIED WITH:     L.VERGELDEDIOS,RN AT 0407 ON 10.24.15 BY SHEA.W     Studies: No results found.  Scheduled Meds: . allopurinol  100 mg Oral q morning - 10a  . antiseptic oral rinse  7 mL Mouth Rinse BID  . aspirin  325 mg Oral q morning - 10a  . atorvastatin  20 mg Oral q morning - 10a  . chlorhexidine  15 mL Mouth Rinse BID  . darifenacin  7.5 mg Oral Daily  . dextromethorphan-guaiFENesin  1 tablet Oral BID  . enoxaparin (LOVENOX) injection  100 mg Subcutaneous Q24H  . folic acid  1 mg Oral Daily  . furosemide  40 mg Intravenous BID  . hydrocerin   Topical BID  . insulin aspart  0-20 Units Subcutaneous TID WC  . insulin aspart  0-5 Units Subcutaneous QHS  . ipratropium-albuterol  3 mL Nebulization Q6H  . multivitamin with minerals  1 tablet Oral Daily  . pantoprazole  40 mg Oral q morning - 10a  . sodium chloride  3 mL Intravenous Q12H  . thiamine  100 mg Oral Daily  . vitamin E  100 Units Oral q morning - 10a   Continuous Infusions:   Principal Problem:   Acute respiratory failure with hypoxia and hypercarbia Active Problems:   Obstructive sleep apnea   Essential hypertension   CHF (congestive heart failure)   Acute respiratory failure   Acute encephalopathy  Time spent: 3min  [facility], [facility] Pager [phone]. If 7PM-7AM, please contact night-coverage at [email or web address], password [facility] 01/20/2014, 8:12 AM  LOS: 7 days

## 2014-01-20 NOTE — Progress Notes (Signed)
Assumed care of pt upon transfer to 4W. Agree with previous RN assessment. VSS. Will continue to monitor. Luisdaniel Kenton, Bing Neighbors, RN

## 2014-01-20 NOTE — Progress Notes (Signed)
Pt stable for transport. VSS. Full report given to 4W RN.

## 2014-01-21 LAB — GLUCOSE, CAPILLARY
GLUCOSE-CAPILLARY: 148 mg/dL — AB (ref 70–99)
Glucose-Capillary: 165 mg/dL — ABNORMAL HIGH (ref 70–99)
Glucose-Capillary: 158 mg/dL — ABNORMAL HIGH (ref 70–99)
Glucose-Capillary: 206 mg/dL — ABNORMAL HIGH (ref 70–99)

## 2014-01-21 LAB — BASIC METABOLIC PANEL
Anion gap: 15 (ref 5–15)
BUN: 46 mg/dL — AB (ref 6–23)
CO2: 31 mEq/L (ref 19–32)
Calcium: 8.8 mg/dL (ref 8.4–10.5)
Chloride: 89 mEq/L — ABNORMAL LOW (ref 96–112)
Creatinine, Ser: 1.51 mg/dL — ABNORMAL HIGH (ref 0.50–1.10)
GFR, EST AFRICAN AMERICAN: 46 mL/min — AB (ref >90)
GFR, EST NON AFRICAN AMERICAN: 40 mL/min — AB (ref >90)
Glucose, Bld: 141 mg/dL — ABNORMAL HIGH (ref 70–99)
Potassium: 4.1 mEq/L (ref 3.7–5.3)
Sodium: 135 mEq/L — ABNORMAL LOW (ref 137–147)

## 2014-01-21 LAB — CBC
HEMATOCRIT: 44.3 % (ref 36.0–46.0)
Hemoglobin: 13.5 g/dL (ref 12.0–15.0)
MCH: 28 pg (ref 26.0–34.0)
MCHC: 30.5 g/dL (ref 30.0–36.0)
MCV: 91.9 fL (ref 78.0–100.0)
Platelets: 199 10*3/uL (ref 150–400)
RBC: 4.82 MIL/uL (ref 3.87–5.11)
RDW: 14.6 % (ref 11.5–15.5)
WBC: 19.9 10*3/uL — ABNORMAL HIGH (ref 4.0–10.5)

## 2014-01-21 MED ORDER — ENOXAPARIN SODIUM 100 MG/ML ~~LOC~~ SOLN
90.0000 mg | SUBCUTANEOUS | Status: DC
Start: 2014-01-21 — End: 2014-01-23
  Administered 2014-01-21 – 2014-01-23 (×3): 90 mg via SUBCUTANEOUS
  Filled 2014-01-21 (×3): qty 1

## 2014-01-21 MED ORDER — FUROSEMIDE 40 MG PO TABS
40.0000 mg | ORAL_TABLET | Freq: Two times a day (BID) | ORAL | Status: DC
  Administered 2014-01-21 – 2014-01-23 (×5): 40 mg via ORAL
  Filled 2014-01-21 (×7): qty 1

## 2014-01-21 MED ORDER — IPRATROPIUM-ALBUTEROL 0.5-2.5 (3) MG/3ML IN SOLN
3.0000 mL | Freq: Three times a day (TID) | RESPIRATORY_TRACT | Status: DC
  Administered 2014-01-21 – 2014-01-23 (×7): 3 mL via RESPIRATORY_TRACT
  Filled 2014-01-21 (×8): qty 3

## 2014-01-21 NOTE — Progress Notes (Signed)
[facility] PROGRESS NOTE  Kara Vincent [id number] DOB: [birth year] DOA: 01/13/2014 PCP: Jearld Lesch, MD  Assessment/Plan: Acute hypoxic and hypercarbic respiratory failure  -Multifactorial in the setting of OSA/OHS, asthma exacerbation and and mild diastolic CHF -Pt noted to have increased respiratory distress on 10/26 and transferred to sdu for bipap -PCCM had been following -PCO2 slowly improving.  -Pt is continued on nebs as tolerated. -had increased Lasix to 40 mg IV twice daily -On Diamox per Pulm recs -Possible needs trach in the future -Pt has been noncompliant with BiPAP on the floor -As Cr is rising,have decreased lasix to home dose (see below) Acute diastolic CHF  -Transitioned to 40 mg po bid of lasix from 40mg  IV bid, home dose. Monitor daily weight and I/O  Hypertension  Continue home medications  Chronic kidney disease stage 2-3  Stable  morbid obesity with BMI > 50  Needs counseling on weight loss and exercise  Code Status: Full Family Communication: Pt in room Disposition Plan: Pending   Consultants:  PCCM  Procedures:    Antibiotics:  none  HPI/Subjective: Tolerating bipap. No acute events noted overnight.  Objective: Filed Vitals:   01/21/14 0348 01/21/14 0917 01/21/14 1416 01/21/14 1423  BP: 124/53   111/63  Pulse: 82   75  Temp: 98.6 F (37 C)   98.2 F (36.8 C)  TempSrc: Axillary   Axillary  Resp: 18   18  Height:      Weight: 181 kg (399 lb 0.5 oz)     SpO2: 93% 96% 94%     Intake/Output Summary (Last 24 hours) at 01/21/14 1657 Last data filed at 01/21/14 1422  Gross per 24 hour  Intake    720 ml  Output   1250 ml  Net   -530 ml   Filed Weights   01/17/14 0400 01/18/14 0400 01/21/14 0348  Weight: 199.4 kg (439 lb 9.6 oz) 204 kg (449 lb 11.8 oz) 181 kg (399 lb 0.5 oz)    Exam:   General:  Awake, in nad  Cardiovascular: regular, s1, s2  Respiratory: normal resp effort, no wheezing  Abdomen:  morbidly obese, soft, nondistended  Musculoskeletal: perfused, no clubbing   Data Reviewed: Basic Metabolic Panel:  Recent Labs Lab 01/16/14 1459 01/18/14 0314 01/19/14 0335 01/20/14 0335 01/21/14 0340  NA 140 143 140 137 135*  K 4.8 4.9 4.4 4.4 4.1  CL 92* 87* 91* 89* 89*  CO2 40* >45* 42* 40* 31  GLUCOSE 200* 155* 121* 124* 141*  BUN 28* 38* 45* 46* 46*  CREATININE 1.40* 1.30* 1.35* 1.46* 1.51*  CALCIUM 8.4 8.1* 8.6 8.8 8.8   Liver Function Tests: No results found for this basename: AST, ALT, ALKPHOS, BILITOT, PROT, ALBUMIN,  in the last 168 hours No results found for this basename: LIPASE, AMYLASE,  in the last 168 hours  Recent Labs Lab 01/16/14 1459  AMMONIA 50   CBC:  Recent Labs Lab 01/15/14 0448 01/16/14 1459 01/18/14 0314 01/20/14 0335 01/21/14 0340  WBC 13.4* 17.5* 14.4* 16.2* 19.9*  HGB 12.2 12.5 11.8* 13.2 13.5  HCT 42.2 44.1 39.8 43.9 44.3  MCV 96.8 100.2* 95.2 92.8 91.9  PLT 263 340 196 196 199   Cardiac Enzymes: No results found for this basename: CKTOTAL, CKMB, CKMBINDEX, TROPONINI,  in the last 168 hours BNP (last 3 results)  Recent Labs  08/30/13 2206 09/19/13 2002 01/13/14 2108  PROBNP 584.4* 102.3 541.0*   CBG:  Recent Labs Lab 01/19/14 2146 01/20/14  4696 01/20/14 1119 01/21/14 0808 01/21/14 1212  GLUCAP 132* 133* 132* 165* 158*    Recent Results (from the past 240 hour(s))  MRSA PCR SCREENING     Status: Abnormal   Collection Time    01/14/14  2:15 AM      Result Value Ref Range Status   MRSA by PCR POSITIVE (*) NEGATIVE Final   Comment:            The GeneXpert MRSA Assay (FDA     approved for NASAL specimens     only), is one component of a     comprehensive MRSA colonization     surveillance program. It is not     intended to diagnose MRSA     infection nor to guide or     monitor treatment for     MRSA infections.     RESULT CALLED TO, READ BACK BY AND VERIFIED WITH:     L.VERGELDEDIOS,RN AT 0407 ON  10.24.15 BY SHEA.W     Studies: No results found.  Scheduled Meds: . allopurinol  100 mg Oral q morning - 10a  . antiseptic oral rinse  7 mL Mouth Rinse BID  . aspirin  325 mg Oral q morning - 10a  . atorvastatin  20 mg Oral q morning - 10a  . chlorhexidine  15 mL Mouth Rinse BID  . darifenacin  7.5 mg Oral Daily  . dextromethorphan-guaiFENesin  1 tablet Oral BID  . enoxaparin (LOVENOX) injection  90 mg Subcutaneous Q24H  . folic acid  1 mg Oral Daily  . furosemide  40 mg Oral BID  . hydrocerin   Topical BID  . insulin aspart  0-20 Units Subcutaneous TID WC  . insulin aspart  0-5 Units Subcutaneous QHS  . ipratropium-albuterol  3 mL Nebulization TID  . multivitamin with minerals  1 tablet Oral Daily  . pantoprazole  40 mg Oral q morning - 10a  . sodium chloride  3 mL Intravenous Q12H  . thiamine  100 mg Oral Daily  . vitamin E  100 Units Oral q morning - 10a   Continuous Infusions:   Principal Problem:   Acute respiratory failure with hypoxia and hypercarbia Active Problems:   Obstructive sleep apnea   Essential hypertension   CHF (congestive heart failure)   Acute respiratory failure   Acute encephalopathy  Time spent:  Kara Vincent K  [facility] Pager [phone]. If 7PM-7AM, please contact night-coverage at [email or web address], password [facility] 01/21/2014, 4:57 PM  LOS: 8 days

## 2014-01-22 LAB — BASIC METABOLIC PANEL
ANION GAP: 13 (ref 5–15)
BUN: 47 mg/dL — ABNORMAL HIGH (ref 6–23)
CHLORIDE: 95 meq/L — AB (ref 96–112)
CO2: 33 mEq/L — ABNORMAL HIGH (ref 19–32)
Calcium: 8.7 mg/dL (ref 8.4–10.5)
Creatinine, Ser: 1.41 mg/dL — ABNORMAL HIGH (ref 0.50–1.10)
GFR, EST AFRICAN AMERICAN: 50 mL/min — AB (ref >90)
GFR, EST NON AFRICAN AMERICAN: 43 mL/min — AB (ref >90)
Glucose, Bld: 128 mg/dL — ABNORMAL HIGH (ref 70–99)
POTASSIUM: 4.1 meq/L (ref 3.7–5.3)
SODIUM: 141 meq/L (ref 137–147)

## 2014-01-22 LAB — GLUCOSE, CAPILLARY
GLUCOSE-CAPILLARY: 193 mg/dL — AB (ref 70–99)
Glucose-Capillary: 150 mg/dL — ABNORMAL HIGH (ref 70–99)
Glucose-Capillary: 122 mg/dL — ABNORMAL HIGH (ref 70–99)
Glucose-Capillary: 176 mg/dL — ABNORMAL HIGH (ref 70–99)

## 2014-01-22 MED ORDER — ADULT MULTIVITAMIN W/MINERALS CH: 1.0000 | ORAL_TABLET | Freq: Every day | ORAL | Status: DC

## 2014-01-22 MED ORDER — FUROSEMIDE 40 MG PO TABS: 40.0000 mg | ORAL_TABLET | Freq: Two times a day (BID) | ORAL | Status: DC

## 2014-01-22 MED ORDER — FOLIC ACID 1 MG PO TABS: 1.0000 mg | ORAL_TABLET | Freq: Every day | ORAL | Status: DC

## 2014-01-22 MED ORDER — ALBUTEROL SULFATE HFA 108 (90 BASE) MCG/ACT IN AERS: 2.0000 | INHALATION_SPRAY | RESPIRATORY_TRACT | Status: DC | PRN

## 2014-01-22 MED ORDER — PANTOPRAZOLE SODIUM 40 MG PO TBEC: 40.0000 mg | DELAYED_RELEASE_TABLET | Freq: Every morning | ORAL | Status: DC

## 2014-01-22 MED ORDER — VITAMIN E 45 MG (100 UNIT) PO CAPS: 100.0000 [IU] | ORAL_CAPSULE | Freq: Every morning | ORAL | Status: DC

## 2014-01-22 MED ORDER — VITAMIN B-12 1000 MCG PO TABS: 1000.0000 ug | ORAL_TABLET | Freq: Every morning | ORAL | Status: DC

## 2014-01-22 MED ORDER — ALLOPURINOL 100 MG PO TABS: 100.0000 mg | ORAL_TABLET | Freq: Every morning | ORAL | Status: DC

## 2014-01-22 MED ORDER — KETOROLAC TROMETHAMINE 10 MG PO TABS
10.0000 mg | ORAL_TABLET | Freq: Four times a day (QID) | ORAL | Status: DC | PRN
  Administered 2014-01-22 – 2014-01-23 (×2): 10 mg via ORAL
  Filled 2014-01-22 (×4): qty 1

## 2014-01-22 MED ORDER — ATORVASTATIN CALCIUM 20 MG PO TABS: 20.0000 mg | ORAL_TABLET | Freq: Every morning | ORAL | Status: DC

## 2014-01-22 NOTE — Progress Notes (Signed)
CSW received a call from RNCM- Aleisia to assist patient with transportation home as she is stable for d/c.  CSW met with patient to discuss. She has an electric wheelchair and multiple belongings room. Patient related that she lives at home with her fiance who is currently in shelter due to a police issue involving a complaint at her home by a female who was staying with patient.  Her fiance has her key and is unable to get to the hospital as he is on restricted movement due to a electronic monitoring device.  Patient stated that she uses home oxygen and does not have a tank here at the hospital. She requests that her current 02 system be changed to the smaller tank system. Patient also wants a CPAP machine- stating that she has had one in the past but it was thrown away several years ago by someone.  She feels that this is the main reason why she has come into the hospital as she feels she needs a CPAP.  CSW discussed with RNCM- Alesia who is aware of above and has notified [facility] re: 02 tanks.  She does not appear to be eligible for a new CPAP machine per RNCM.  Patient states that she can utilize the [employer] bus at d/c once her fiance brings her key tomorrow.  Dr. Chiu notified of above.  Will ask weekday CSW to follow up with patient arranging bus pass for tomorrow.  Jilliam T. , LCSW  [phone]    

## 2014-01-22 NOTE — Evaluation (Signed)
Physical Therapy Evaluation Patient Details Name: Kara Vincent MRN: [id number] DOB: 1965-08-16 Today's Date: 01/22/2014   History of Present Illness  48 yo admitted 10/23/15Acute hypoxic and hypercarbic respiratory failure  , multifactorial with CHF, OSA, Pneumonia.  Clinical Impression  Patient is very mobile, using  Bed rails for mobility, able to pivot to [facility] and back with no  Assist.patient will benefit from PT while in acute care .    Follow Up Recommendations Home health PT;Supervision/Assistance - 24 hour    Equipment Recommendations  Cane;3in1 (PT)    Recommendations for Other Services       Precautions / Restrictions Precautions Precautions: Fall Precaution Comments: monitor sats, on 3 l.      Mobility  Bed Mobility Overal bed mobility: Needs Assistance Bed Mobility: Supine to Sit;Sit to Supine     Supine to sit: Supervision;HOB elevated     General bed mobility comments: use of rail  Transfers Overall transfer level: Needs assistance   Transfers: Sit to/from Stand;Stand Pivot Transfers Sit to Stand: Min guard Stand pivot transfers: Min guard       General transfer comment: pt uses rail of bed and BSC arm rest for support.   Ambulation/Gait                Stairs            Wheelchair Mobility    Modified Rankin (Stroke Patients Only)       Balance Overall balance assessment: Modified Independent                                           Pertinent Vitals/Pain Pain Assessment: 0-10 Pain Score: 6  Pain Location: HA Pain Descriptors / Indicators: Aching    Home Living Family/patient expects to be discharged to:: Private residence Living Arrangements: Non-relatives/Friends Available Help at Discharge: Friend(s);Available PRN/intermittently Type of Home: House Home Access: Ramped entrance     Home Layout: One level Home Equipment: Transport planner      Prior Function Level of Independence: Needs  assistance   Gait / Transfers Assistance Needed: minimal ambulation with a cane, uses scooter.           Hand Dominance        Extremity/Trunk Assessment               Lower Extremity Assessment: Generalized weakness      Cervical / Trunk Assessment: Normal  Communication      Cognition Arousal/Alertness: Awake/alert Behavior During Therapy: WFL for tasks assessed/performed Overall Cognitive Status: Within Functional Limits for tasks assessed                      General Comments      Exercises        Assessment/Plan    PT Assessment Patient needs continued PT services  PT Diagnosis Generalized weakness   PT Problem List Decreased strength;Decreased activity tolerance;Decreased mobility;Cardiopulmonary status limiting activity;Obesity  PT Treatment Interventions DME instruction;Functional mobility training;Therapeutic activities;Therapeutic exercise;Patient/family education   PT Goals (Current goals can be found in the Care Plan section) Acute Rehab PT Goals Patient Stated Goal: to go back home when I am better. PT Goal Formulation: With patient Time For Goal Achievement: 02/05/14 Potential to Achieve Goals: Good    Frequency Min 3X/week   Barriers to discharge        Co-evaluation  End of Session   Activity Tolerance: Patient tolerated treatment well Patient left: in bed (with OT sitting at edge) Nurse Communication: Mobility status         Time: 1020-1048 PT Time Calculation (min): 28 min   Charges:   PT Evaluation $Initial PT Evaluation Tier I: 1 Procedure PT Treatments $Therapeutic Activity: 23-37 mins   PT G Codes:          Claretha Cooper 01/22/2014, 11:01 AM Tresa Endo PT [phone]

## 2014-01-22 NOTE — Discharge Instructions (Signed)
Obesity Obesity is having too much body fat and a body mass index (BMI) of 30 or more. BMI is a number based on your height and weight. The number is an estimate of how much body fat you have. Obesity can happen if you eat more calories than you can burn by exercising or other activity. It can cause major health problems or emergencies.  HOME CARE  Exercise and be active as told by your doctor. Try:  Using stairs when you can.  Parking farther away from store doors.  Gardening, biking, or walking.  Eat healthy foods and drinks that are low in calories. Eat more fruits and vegetables.  Limit fast food, sweets, and snack foods that are made with ingredients that are not natural (processed food).  Eat smaller amounts of food.  Keep a journal and write down what you eat every day. Websites can help with this.  Avoid drinking alcohol. Drink more water and drinks without calories.   Take vitamins and dietary pills (supplements) only as told by your doctor.  Try going to weight-loss support groups or classes to help lessen stress. Dietitians and counselors may also help. GET HELP RIGHT AWAY IF:  You have chest pain or tightness.  You have trouble breathing or feel short of breath.  You feel weak or have loss of feeling (numbness) in your legs.  You feel confused or have trouble talking.  You have sudden changes in your vision. MAKE SURE YOU:  Understand these instructions.  Will watch your condition.  Will get help right away if you are not doing well or get worse. Document Released: 06/02/2011 Document Revised: 07/25/2013 Document Reviewed: 06/02/2011 [facility] Patient Information 2015 [place], [place]. This information is not intended to replace advice given to you by your health care provider. Make sure you discuss any questions you have with your health care provider.

## 2014-01-22 NOTE — Discharge Summary (Signed)
Physician Discharge Summary  Kara Vincent [id number] DOB: [birth year] DOA: 01/13/2014  PCP: Harvie Junior, MD  Admit date: 01/13/2014 Discharge date: 01/22/2014  Time spent: 35 minutes  Recommendations for Outpatient Follow-up:  1. Follow up with PCP in 1-2 weeks  Discharge Diagnoses:  Principal Problem:   Acute respiratory failure with hypoxia and hypercarbia Active Problems:   Obstructive sleep apnea   Essential hypertension   CHF (congestive heart failure)   Acute respiratory failure   Acute encephalopathy  Discharge Condition: Improved  Diet recommendation: Heart healthy  Filed Weights   01/21/14 0348 01/22/14 0500 01/22/14 0621  Weight: 181 kg (399 lb 0.5 oz) 187.381 kg (413 lb 1.6 oz) 187.381 kg (413 lb 1.6 oz)    History of present illness:  Please see admit h and p from 10/24 for details. Briefly, pt presents with sob in the setting of morbid obesity with OSA. Reportedly not on on PAP therapy. Patient was admitted for further work up.  Hospital Course:  Acute hypoxic and hypercarbic respiratory failure  -Multifactorial in the setting of OSA/OHS, asthma exacerbation and and mild diastolic CHF -Pt noted to have increased respiratory distress on 10/26 and transferred to sdu for bipap -PCCM had been following -PCO2 slowly improving.  -Pt is continued on nebs as tolerated. -had increased Lasix to 40 mg IV twice daily -Given Diamox per Pulm recs -Possible needs trach in the future -Pt has been noncompliant with BiPAP on the floor -Cr had risen,subsequently decreased lasix to home dose (see below) Acute diastolic CHF  -Transitioned to 40 mg po bid of lasix from 40mg  IV bid, home dose. 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  Needs counseling on weight loss and exercise  Consultations:  Pulmonary/Critical Care  Discharge  Exam: Filed Vitals:   01/21/14 2145 01/22/14 0500 01/22/14 0621 01/22/14 0913  BP: 126/53  128/51   Pulse: 93  79   Temp: 97.7 F (36.5 C)  97.9 F (36.6 C)   TempSrc: Oral  Oral   Resp: 20  20   Height:      Weight:  187.381 kg (413 lb 1.6 oz) 187.381 kg (413 lb 1.6 oz)   SpO2: 92%  97% 96%    General: Awake, in nad Cardiovascular: regular, s1, s2 Respiratory: normal resp effort, no wheezing  Discharge Instructions     Medication List    STOP taking these medications        amitriptyline 75 MG tablet  Commonly known as:  ELAVIL     lisinopril 40 MG tablet  Commonly known as:  PRINIVIL,ZESTRIL     naproxen 500 MG tablet  Commonly known as:  NAPROSYN     neomycin-polymyxin-dexamethasone 0.1 % ophthalmic suspension  Commonly known as:  MAXITROL     omega-3 acid ethyl esters 1 G capsule  Commonly known as:  LOVAZA     promethazine 25 MG tablet  Commonly known as:  PHENERGAN      TAKE these medications        albuterol (2.5 MG/3ML) 0.083% nebulizer solution  Commonly known as:  PROVENTIL  Take 2.5 mg by nebulization every 6 (six) hours as needed for wheezing or shortness of breath.     albuterol 108 (90 BASE) MCG/ACT inhaler  Commonly known as:  PROVENTIL HFA  Inhale 2 puffs into the lungs every 4 (four) hours as needed  for wheezing or shortness of breath.     allopurinol 100 MG tablet  Commonly known as:  ZYLOPRIM  Take 1 tablet (100 mg total) by mouth every morning.     ammonium lactate 12 % cream  Commonly known as:  AMLACTIN  Apply 1 g topically daily as needed for dry skin.     aspirin 325 MG tablet  Take 325 mg by mouth every morning.     atorvastatin 20 MG tablet  Commonly known as:  LIPITOR  Take 20 mg by mouth every morning.     atorvastatin 20 MG tablet  Commonly known as:  LIPITOR  Take 1 tablet (20 mg total) by mouth every morning.     clonazePAM 1 MG tablet  Commonly known as:  KLONOPIN  Take 1 mg by mouth 2 (two) times daily.      diphenhydrAMINE 25 mg capsule  Commonly known as:  BENADRYL  Take 25 mg by mouth every 6 (six) hours as needed for itching or allergies.     eucerin lotion  Apply 5 mLs topically 2 (two) times daily as needed for dry skin.     folic acid 1 MG tablet  Commonly known as:  FOLVITE  Take 1 tablet (1 mg total) by mouth daily.     furosemide 40 MG tablet  Commonly known as:  LASIX  Take 40 mg by mouth 2 (two) times daily.     furosemide 40 MG tablet  Commonly known as:  LASIX  Take 1 tablet (40 mg total) by mouth 2 (two) times daily.     guaiFENesin 600 MG 12 hr tablet  Commonly known as:  MUCINEX  Take 600 mg by mouth 2 (two) times daily.     HYDROcodone-acetaminophen 10-325 MG per tablet  Commonly known as:  NORCO  Take 1 tablet by mouth every 6 (six) hours as needed for pain.     hydrOXYzine 25 MG tablet  Commonly known as:  ATARAX/VISTARIL  Take 25 mg by mouth 3 (three) times daily as needed for anxiety or itching.     ketoconazole 2 % shampoo  Commonly known as:  NIZORAL  Apply 1 application topically 2 (two) times a week.     ketoconazole 200 MG tablet  Commonly known as:  NIZORAL  Take 200 mg by mouth every morning.     multivitamin with minerals Tabs tablet  Take 1 tablet by mouth daily.     pantoprazole 40 MG tablet  Commonly known as:  PROTONIX  Take 40 mg by mouth every morning.     pantoprazole 40 MG tablet  Commonly known as:  PROTONIX  Take 1 tablet (40 mg total) by mouth every morning.     solifenacin 5 MG tablet  Commonly known as:  VESICARE  Take 5 mg by mouth every morning.     vitamin B-12 1000 MCG tablet  Commonly known as:  CYANOCOBALAMIN  Take 1 tablet (1,000 mcg total) by mouth every morning.     vitamin E 100 UNIT capsule  Take 1 capsule (100 Units total) by mouth every morning.     vitamin E 100 UNIT capsule  Take 100 Units by mouth every morning.       Allergies  Allergen Reactions  . Ibuprofen Hives, Shortness Of Breath and  Palpitations  . Adhesive [Tape] Rash  . Azithromycin Hives and Rash  . Doxycycline Rash  . Penicillins Rash  . Sulfa Antibiotics Rash  . Sulfamethoxazole-Trimethoprim Rash  Follow-up Information    Follow up with Harvie Junior, MD. Schedule an appointment as soon as possible for a visit in 1 week.   Specialty:  Specialist   Contact information:   [address] [place] [address] [phone]        The results of significant diagnostics from this hospitalization (including imaging, microbiology, ancillary and laboratory) are listed below for reference.    Significant Diagnostic Studies: Dg Chest 2 View  01/13/2014   CLINICAL DATA:  Shortness of breath.  Onset 3 days ago.  EXAM: CHEST  2 VIEW  COMPARISON:  09/19/2013  FINDINGS: Cardiomegaly with vascular congestion and probable early interstitial edema. Low lung volumes with bibasilar atelectasis. No definite effusions. No acute bony abnormality.  IMPRESSION: Suspect mild interstitial edema/CHF. Low lung volumes with bibasilar atelectasis.   Electronically Signed   By: Rolm Baptise M.D.   On: 01/13/2014 21:39    Microbiology: Recent Results (from the past 240 hour(s))  MRSA PCR Screening     Status: Abnormal   Collection Time: 01/14/14  2:15 AM  Result Value Ref Range Status   MRSA by PCR POSITIVE (A) NEGATIVE Final    Comment:        The GeneXpert MRSA Assay (FDA approved for NASAL specimens only), is one component of a comprehensive MRSA colonization surveillance program. It is not intended to diagnose MRSA infection nor to guide or monitor treatment for MRSA infections. RESULT CALLED TO, READ BACK BY AND VERIFIED WITH: L.VERGELDEDIOS,RN AT 0407 ON 10.24.15 BY SHEA.W     Labs: Basic Metabolic Panel:  Recent Labs Lab 01/18/14 0314 01/19/14 0335 01/20/14 0335 01/21/14 0340 01/22/14 0455  NA 143 140 137 135* 141  K 4.9 4.4 4.4 4.1 4.1  CL 87* 91* 89* 89* 95*  CO2 >45* 42* 40* 31 33*  GLUCOSE  155* 121* 124* 141* 128*  BUN 38* 45* 46* 46* 47*  CREATININE 1.30* 1.35* 1.46* 1.51* 1.41*  CALCIUM 8.1* 8.6 8.8 8.8 8.7   Liver Function Tests: No results for input(s): AST, ALT, ALKPHOS, BILITOT, PROT, ALBUMIN in the last 168 hours. No results for input(s): LIPASE, AMYLASE in the last 168 hours.  Recent Labs Lab 01/16/14 1459  AMMONIA 50   CBC:  Recent Labs Lab 01/16/14 1459 01/18/14 0314 01/20/14 0335 01/21/14 0340  WBC 17.5* 14.4* 16.2* 19.9*  HGB 12.5 11.8* 13.2 13.5  HCT 44.1 39.8 43.9 44.3  MCV 100.2* 95.2 92.8 91.9  PLT 340 196 196 199   Cardiac Enzymes: No results for input(s): CKTOTAL, CKMB, CKMBINDEX, TROPONINI in the last 168 hours. BNP: BNP (last 3 results)  Recent Labs  08/30/13 2206 09/19/13 2002 01/13/14 2108  PROBNP 584.4* 102.3 541.0*   CBG:  Recent Labs Lab 01/21/14 1212 01/21/14 1710 01/21/14 2133 01/22/14 0656 01/22/14 1133  GLUCAP 158* 206* 148* 150* 193*   Signed:  CHIU, STEPHEN K  [facility] 01/22/2014, 12:39 PM

## 2014-01-22 NOTE — Progress Notes (Signed)
CARE MANAGEMENT NOTE 01/22/2014  Patient:  Kara Vincent, Kara Vincent   Account Number:  [id number]  Date Initiated:  01/18/2014  Documentation initiated by:  DAVIS,RHONDA  Subjective/Objective Assessment:   pt transferred from med surg floor to sdu due to resp distress pm of [id number] patient on Fi02 at 50% and Bipap     Action/Plan:   tbd   Anticipated DC Date:  01/21/2014   Anticipated DC Plan:  [place]  In-house referral  NA      DC Planning Services  CM consult      [facility] Choice  HOME HEALTH   Choice offered to / List presented to:  C-1 Patient   DME arranged  [facility]      DME agency  [place] arranged  HH-1 RN  [place].   Status of service:  Completed, signed off Medicare Important Message given?   (If response is "NO", the following Medicare IM given date fields will be blank) Date Medicare IM given:   Medicare IM given by:   Date Additional Medicare IM given:   Additional Medicare IM given by:    Discharge Disposition:  [place]  Per UR Regulation:  Reviewed for med. necessity/level of care/duration of stay  If discussed at [place] of Stay Meetings, dates discussed:    Comments:  01/22/2014 1400 NCM spoke to pt and offered choice for [facility]. Pt requested [facility] for HH. Pt states she has scooter, 3n1, hospital bed and tub bench at home. [facility] rep notified for cane. Contacted [facility] for [facility] needed for scheduled dc home today. Pt reports she has oxygen. Her CPAP was left at her old address. Contacted [facility] for CPAP replacement. CPAP received 2013.  Medicaid will not cover [facility] PT/OT.   Jonnie Finner RN CCM Case Mgmt phone [phone]  [id number] Rosana Hoes, RN, BSN, CCM Chart reviewed. Discharge needs and patient's stay to be reviewed and followed by case manager. chart note: TODAY'S SUMMARY: Acute respiratory acidosis, due to decompensated obesity hypoventilation, with  diastolic heart failure. Diamox in attempt to lower pco2 to lower level. Still may need trach infuture.

## 2014-01-22 NOTE — Progress Notes (Signed)
[place] is providing the following services: [facility], [facility]  If patient discharges after hours, please call [phone].   Linward Headland 01/22/2014, 3:21 PM

## 2014-01-22 NOTE — Evaluation (Signed)
Occupational Therapy Evaluation Patient Details Name: Kara Vincent MRN: [id number] DOB: 12/12/1965 Today's Date: 01/22/2014    History of Present Illness 49 yo admitted 10/23/15Acute hypoxic and hypercarbic respiratory failure  , multifactorial with CHF, OSA, Pneumonia.   Clinical Impression   Pt was assisted inconsistently by her boyfriend for ADL and IADL prior to admission.  Pt moves well considering her body habitus.  She will benefit from education in use of AE for LB ADL and ADL training to maximize independence and endurance upon d/c home.  Will follow.    Follow Up Recommendations  Home health OT    Equipment Recommendations  3 in 1 bedside comode;Tub/shower bench (Pt also requesting scales.)    Recommendations for Other Services       Precautions / Restrictions Precautions Precautions: Fall Precaution Comments: monitor sats, on 3 l. Restrictions Weight Bearing Restrictions: No      Mobility Bed Mobility Overal bed mobility: Needs Assistance Bed Mobility: Sit to Supine     Supine to sit: Supervision;HOB elevated Sit to supine: Supervision   General bed mobility comments: use of rail  Transfers Overall transfer level: Needs assistance   Transfers: Sit to/from Stand;Stand Pivot Transfers Sit to Stand: Min guard Stand pivot transfers: Min guard       General transfer comment: pt uses rail of bed and BSC arm rest for support.     Balance Overall balance assessment: Modified Independent                                          ADL Overall ADL's : Needs assistance/impaired Eating/Feeding: Independent;Bed level   Grooming: Wash/dry hands;Wash/dry face;Set up;Sitting;Brushing hair   Upper Body Bathing: Minimal assitance;Sitting   Lower Body Bathing: Maximal assistance;Sit to/from stand   Upper Body Dressing : Set up;Sitting   Lower Body Dressing: Maximal assistance;Sit to/from stand   Toilet Transfer: Min  guard;Stand-pivot;BSC   Toileting- Water quality scientist and Hygiene: Maximal assistance;Sit to/from stand         General ADL Comments: Issued and began instruction in use of AE for LB ADL. Pt reports her DME was lost when she was in a homeless shelter.     Vision                     Perception     Praxis      Pertinent Vitals/Pain Pain Assessment: 0-10 Pain Score: 10-Worst pain ever Pain Location: head Pain Descriptors / Indicators: Aching Pain Intervention(s): Monitored during session;Repositioned;Patient requesting pain meds-RN notified     Hand Dominance Right   Extremity/Trunk Assessment Upper Extremity Assessment Upper Extremity Assessment: Overall WFL for tasks assessed   Lower Extremity Assessment Lower Extremity Assessment: Defer to PT evaluation   Cervical / Trunk Assessment Cervical / Trunk Assessment: Normal   Communication Communication Communication: No difficulties   Cognition Arousal/Alertness: Awake/alert Behavior During Therapy: WFL for tasks assessed/performed Overall Cognitive Status: Within Functional Limits for tasks assessed                     General Comments       Exercises       Shoulder Instructions      Home Living Family/patient expects to be discharged to:: Private residence Living Arrangements: Non-relatives/Friends Available Help at Discharge: Friend(s);Available PRN/intermittently Type of Home: House Home Access: Ramped entrance     Home  Layout: One level     Bathroom Shower/Tub: Teacher, early years/pre: Standard     Home Equipment: Transport planner          Prior Functioning/Environment Level of Independence: Needs assistance  Gait / Transfers Assistance Needed: minimal ambulation with a cane, uses scooter. ADL's / Homemaking Assistance Needed: boyfriend assisted with some ADL, housekeeping and meal prep        OT Diagnosis: Generalized weakness;Acute pain   OT Problem  List: Decreased strength;Decreased activity tolerance;Impaired balance (sitting and/or standing);Decreased knowledge of use of DME or AE;Cardiopulmonary status limiting activity;Obesity;Pain   OT Treatment/Interventions: Self-care/ADL training;DME and/or AE instruction;Therapeutic activities;Patient/family education    OT Goals(Current goals can be found in the care plan section) Acute Rehab OT Goals Patient Stated Goal: to go back home when I am better. OT Goal Formulation: With patient Time For Goal Achievement: 02/05/14 Potential to Achieve Goals: Good ADL Goals Pt Will Perform Grooming: with modified independence;standing Pt Will Perform Lower Body Bathing: with modified independence;with adaptive equipment;sit to/from stand Pt Will Perform Lower Body Dressing: with modified independence;with adaptive equipment;sit to/from stand Pt Will Transfer to Toilet: with modified independence;ambulating;bedside commode Pt Will Perform Toileting - Clothing Manipulation and hygiene: with modified independence;sit to/from stand (assess for AE)  OT Frequency: Min 2X/week   Barriers to D/C:            Co-evaluation              End of Session Nurse Communication: Patient requests pain meds  Activity Tolerance: Patient tolerated treatment well Patient left: in bed;in CPM;with nursing/sitter in room   Time: 1040-1100 OT Time Calculation (min): 20 min Charges:  OT General Charges $OT Visit: 1 Procedure OT Evaluation $Initial OT Evaluation Tier I: 1 Procedure OT Treatments $Self Care/Home Management : 8-22 mins G-Codes:    Malka So 01/22/2014, 12:20 PM  [phone]

## 2014-01-22 NOTE — Progress Notes (Signed)
01/22/2014 1438 Spoke to [facility] rep about HH and pt had services in past. Pt can have new nebulizer for home. Jonnie Finner RN CCM Case Mgmt phone [phone]

## 2014-01-23 DIAGNOSIS — I10 Essential (primary) hypertension: Secondary | ICD-10-CM

## 2014-01-23 DIAGNOSIS — J9602 Acute respiratory failure with hypercapnia: Secondary | ICD-10-CM | POA: Insufficient documentation

## 2014-01-23 LAB — GLUCOSE, CAPILLARY
Glucose-Capillary: 186 mg/dL — ABNORMAL HIGH (ref 70–99)
Glucose-Capillary: 175 mg/dL — ABNORMAL HIGH (ref 70–99)

## 2014-01-23 MED ORDER — KETOROLAC TROMETHAMINE 10 MG PO TABS: 10.0000 mg | ORAL_TABLET | Freq: Four times a day (QID) | ORAL | Status: DC | PRN

## 2014-01-23 NOTE — Progress Notes (Signed)
[facility] PROGRESS NOTE  Kara Vincent [id number] DOB: 07/11/1965 DOA: 01/13/2014 PCP: Jearld Lesch, MD  Assessment/Plan: Acute hypoxic and hypercarbic respiratory failure  -Multifactorial in the setting of OSA/OHS, asthma exacerbation and and mild diastolic CHF -Pt noted to have increased respiratory distress on 10/26 and transferred to sdu for bipap -PCCM had been following -PCO2 slowly improving.  -Pt is continued on nebs as tolerated. -had increased Lasix to 40 mg IV twice daily -On Diamox per Pulm recs -Possible needs trach in the future -Pt has been intermittently noncompliant with BiPAP on the floor -As Cr was rising,had decreased lasix to home dose (see below) Acute diastolic CHF  -Transitioned back to home dose of 40 mg po bid of lasix from 40mg  IV bid Hypertension  Continue home medications  Chronic kidney disease stage 2-3  Stable  morbid obesity with BMI > 50  Needs counseling on weight loss and exercise Chronic pain NCCSR reviewed. 30 supply of narcotics and alprazolam was prescribed 12/16/13, due for refill 10/25, but pt had been in the hospital for the past 10 days, thus she should still have over one week supply at home. Pt has asked for refills of pain meds and alprazolam. Explained to patient she should still have above controlled drugs at home. Pt understands.  Code Status: Full Family Communication: Pt in room Disposition Plan: Plan d/c today   Consultants:  PCCM  Procedures:    Antibiotics:  none  HPI/Subjective: No acute events noted overnight. Pt eager to go to [facility] as soon as she is discharged.  Objective: Filed Vitals:   01/22/14 2019 01/22/14 2056 01/23/14 0423 01/23/14 0915  BP:  104/77 118/37   Pulse: 76 80 76   Temp:  99.4 F (37.4 C) 97.8 F (36.6 C)   TempSrc:  Oral Oral   Resp: 18 20 20    Height:      Weight:      SpO2: 97% 97% 99% 97%    Intake/Output Summary (Last 24 hours) at 01/23/14  1411 Last data filed at 01/23/14 1044  Gross per 24 hour  Intake      0 ml  Output   1700 ml  Net  -1700 ml   Filed Weights   01/21/14 0348 01/22/14 0500 01/22/14 0621  Weight: 181 kg (399 lb 0.5 oz) 187.381 kg (413 lb 1.6 oz) 187.381 kg (413 lb 1.6 oz)    Exam:   General:  Awake, in nad, morbidly obese  Cardiovascular: regular, s1, s2  Respiratory: normal resp effort, no wheezing  Abdomen: morbidly obese, soft, nondistended  Musculoskeletal: perfused, no clubbing   Data Reviewed: Basic Metabolic Panel:  Recent Labs Lab 01/18/14 0314 01/19/14 0335 01/20/14 0335 01/21/14 0340 01/22/14 0455  NA 143 140 137 135* 141  K 4.9 4.4 4.4 4.1 4.1  CL 87* 91* 89* 89* 95*  CO2 >45* 42* 40* 31 33*  GLUCOSE 155* 121* 124* 141* 128*  BUN 38* 45* 46* 46* 47*  CREATININE 1.30* 1.35* 1.46* 1.51* 1.41*  CALCIUM 8.1* 8.6 8.8 8.8 8.7   Liver Function Tests: No results for input(s): AST, ALT, ALKPHOS, BILITOT, PROT, ALBUMIN in the last 168 hours. No results for input(s): LIPASE, AMYLASE in the last 168 hours.  Recent Labs Lab 01/16/14 1459  AMMONIA 50   CBC:  Recent Labs Lab 01/16/14 1459 01/18/14 0314 01/20/14 0335 01/21/14 0340  WBC 17.5* 14.4* 16.2* 19.9*  HGB 12.5 11.8* 13.2 13.5  HCT 44.1 39.8 43.9 44.3  MCV  100.2* 95.2 92.8 91.9  PLT 340 196 196 199   Cardiac Enzymes: No results for input(s): CKTOTAL, CKMB, CKMBINDEX, TROPONINI in the last 168 hours. BNP (last 3 results)  Recent Labs  08/30/13 2206 09/19/13 2002 01/13/14 2108  PROBNP 584.4* 102.3 541.0*   CBG:  Recent Labs Lab 01/22/14 1133 01/22/14 1659 01/22/14 2054 01/23/14 0823 01/23/14 1315  GLUCAP 193* 122* 176* 186* 175*    Recent Results (from the past 240 hour(s))  MRSA PCR Screening     Status: Abnormal   Collection Time: 01/14/14  2:15 AM  Result Value Ref Range Status   MRSA by PCR POSITIVE (A) NEGATIVE Final    Comment:        The GeneXpert MRSA Assay (FDA approved for NASAL  specimens only), is one component of a comprehensive MRSA colonization surveillance program. It is not intended to diagnose MRSA infection nor to guide or monitor treatment for MRSA infections. RESULT CALLED TO, READ BACK BY AND VERIFIED WITH: L.[email or web address],RN AT 0407 ON 10.24.15 BY SHEA.W     Studies: No results found.  Scheduled Meds: . allopurinol  100 mg Oral q morning - 10a  . antiseptic oral rinse  7 mL Mouth Rinse BID  . aspirin  325 mg Oral q morning - 10a  . atorvastatin  20 mg Oral q morning - 10a  . chlorhexidine  15 mL Mouth Rinse BID  . darifenacin  7.5 mg Oral Daily  . dextromethorphan-guaiFENesin  1 tablet Oral BID  . enoxaparin (LOVENOX) injection  90 mg Subcutaneous Q24H  . folic acid  1 mg Oral Daily  . furosemide  40 mg Oral BID  . hydrocerin   Topical BID  . insulin aspart  0-20 Units Subcutaneous TID WC  . insulin aspart  0-5 Units Subcutaneous QHS  . ipratropium-albuterol  3 mL Nebulization TID  . multivitamin with minerals  1 tablet Oral Daily  . pantoprazole  40 mg Oral q morning - 10a  . sodium chloride  3 mL Intravenous Q12H  . thiamine  100 mg Oral Daily  . vitamin E  100 Units Oral q morning - 10a   Continuous Infusions:   Principal Problem:   Acute respiratory failure with hypoxia and hypercarbia Active Problems:   Obstructive sleep apnea   Essential hypertension   CHF (congestive heart failure)   Acute respiratory failure   Acute encephalopathy  Time spent:  Kara Vincent K  [facility] Pager [phone]. If 7PM-7AM, please contact night-coverage at [email or web address], password [facility] 01/23/2014, 2:11 PM  LOS: 10 days

## 2014-01-23 NOTE — Progress Notes (Addendum)
PULMONARY / CRITICAL CARE MEDICINE   Name: Kara Vincent MRN: [id number] DOB: 1965/12/01    ADMISSION DATE:  01/13/2014 CONSULTATION DATE:  01/23/2014  REFERRING MD :  Dhungel  CHIEF COMPLAINT:  Respiratory distress  INITIAL PRESENTATION: 48 year old morbidly obese woman with OSA and obesity hypoventilation, requiring BiPAP for acute hypercarbic respiratory failure  STUDIES:  08/2013 echo-normal LV function, normal diastolic parameters, mildly dilated RV  SIGNIFICANT EVENTS: 10/26 PCCM consult 10/27 remains on NIMVS 10/28 4 doses of Diamox to attempt to resent Co2 at lower level  SUBJECTIVE:  NAD off NIMVS, sleeping in bed at 7 AM  VITAL SIGNS: Temp:  [97.8 F (36.6 C)-99.4 F (37.4 C)] 97.8 F (36.6 C) (11/02 0423) Pulse Rate:  [72-80] 76 (11/02 0423) Resp:  [18-20] 20 (11/02 0423) BP: (104-121)/(37-77) 118/37 mmHg (11/02 0423) SpO2:  [97 %-99 %] 97 % (11/02 0915)  HEMODYNAMICS:    VENTILATOR SETTINGS:    INTAKE / OUTPUT:  Intake/Output Summary (Last 24 hours) at 01/23/14 1002 Last data filed at 01/23/14 0435  Gross per 24 hour  Intake      0 ml  Output   1800 ml  Net  -1800 ml   PHYSICAL EXAMINATION: Gen. Morbidly obese, no distress, arousable but falls back asleep during exam Neck: No JVD, no thyromegaly, no carotid bruits Lungs: diminished bs thru out and very distant. Cardiovascular: Rhythm regular, heart sounds  normal, no murmurs, 1+ peripheral edema Abdomen: soft , huge and non-tender, no hepatosplenomegaly, BS normal. Musculoskeletal: No deformities, no cyanosis or clubbing Neuro:  Awake, alert, wants food Skin:  Warm, no lesions/ rash   LABS:  CBC  Recent Labs Lab 01/18/14 0314 01/20/14 0335 01/21/14 0340  WBC 14.4* 16.2* 19.9*  HGB 11.8* 13.2 13.5  HCT 39.8 43.9 44.3  PLT 196 196 199   Coag's No results for input(s): APTT, INR in the last 168 hours. BMET  Recent Labs Lab 01/20/14 0335 01/21/14 0340 01/22/14 0455  NA 137  135* 141  K 4.4 4.1 4.1  CL 89* 89* 95*  CO2 40* 31 33*  BUN 46* 46* 47*  CREATININE 1.46* 1.51* 1.41*  GLUCOSE 124* 141* 128*   Electrolytes  Recent Labs Lab 01/20/14 0335 01/21/14 0340 01/22/14 0455  CALCIUM 8.8 8.8 8.7   Sepsis Markers No results for input(s): LATICACIDVEN, PROCALCITON, O2SATVEN in the last 168 hours.  ABG  Recent Labs Lab 01/17/14 0501 01/17/14 1150 01/19/14 1205  PHART 7.316* 7.374 7.399  PCO2ART 92.6* 84.5* 69.3*  PO2ART 67.5* 61.8* 80.5   Liver Enzymes No results for input(s): AST, ALT, ALKPHOS, BILITOT, ALBUMIN in the last 168 hours.  Cardiac Enzymes No results for input(s): TROPONINI, PROBNP in the last 168 hours.  Glucose  Recent Labs Lab 01/21/14 2133 01/22/14 0656 01/22/14 1133 01/22/14 1659 01/22/14 2054 01/23/14 0823  GLUCAP 148* 150* 193* 122* 176* 186*    Intake/Output Summary (Last 24 hours) at 01/23/14 1002 Last data filed at 01/23/14 0435  Gross per 24 hour  Intake      0 ml  Output   1800 ml  Net  -1800 ml    Imaging No results found.   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 OSA/OHS Doubt true asthma P:   BiPAP 18/12 mandatory qhs, social work getting machine from home health per notes (please note it is a BiPAP not a CPAP). D/c IV Solu-Medrol 10/28. Suspect she needs tracheostomy unless she is able to  improve compliance with home NIPPV. Avoid all sedating medications. 4 doses of diamox in given 10/28, bicarb dropped but increasing again now, likely equilibrated at current CO2/HCO3 ratio, will not repeat.  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  RENAL A:  Acute on CKD P:   Monitor Dc lisinopril for now, consider restart when stabilized but note potential contribution to UA disease If staying in  the hospital would recommend a repeat to see if Cr is improving, but will defer to primary.  GASTROINTESTINAL A:  Morbid obesity P:   Heart healthy diet  ENDOCRINE A:  Hyperglycemia, steroid-induced   P:   SSI  NEUROLOGIC A:  Acute encephalopathy(resolved) P:   RASS goal: 0 to 1 Hold sedating meds such as clonazepam   TODAY'S SUMMARY: Respiratory situation stabilized, will need home BiPAP (?compliance), will likely need a tracheostomy if unable to comply.  Steroids now off, all medications changed to PO.  Once [facility] provide the patient's needs at home then may discharge with F/U.  PCCM will sign off, please call back if needed.  Rush Farmer, M.D. [facility] Pulmonary/Critical Care Medicine. Pager: [phone]. After hours pager: [phone].

## 2014-01-23 NOTE — Progress Notes (Signed)
Occupational Therapy Treatment Patient Details Name: Kara Vincent MRN: [id number] DOB: [birth year] Today's Date: 01/23/2014    History of present illness 48 yo admitted 10/23/15Acute hypoxic and hypercarbic respiratory failure  , multifactorial with CHF, OSA, Pneumonia.   OT comments  Pt educated further on AE options and has kit in her room. Pt practiced with reacher and sock aid today. Discussed DME and how to use tub transfer bench. Pt laid herself back down due to discomfort from her catheter. She states nursing is aware.   Follow Up Recommendations  Home health OT    Equipment Recommendations  3 in 1 bedside comode;Tub/shower bench    Recommendations for Other Services      Precautions / Restrictions Precautions Precautions: Fall Precaution Comments: monitor sats, on 3 l.       Mobility Bed Mobility Overal bed mobility: Needs Assistance       Supine to sit: Supervision Sit to supine: Supervision   General bed mobility comments: use of rail  Transfers                      Balance                                   ADL                       Lower Body Dressing: Minimal assistance;Sitting/lateral leans                 General ADL Comments: Pt educated further on AE for LB self care. Pt practiced with doffing R sock with reacher with supervision. She donned R sock with min assist and verbal and demo cues. Pt stating catheter was uncomfortable at EOB and wanting to lie back down. Verbally educated her further on other AE pieces in her kit including LHS and shoe horn. Discussed her DMe and she will need a wide 3in1 as well as tubbench.       Vision                     Perception     Praxis      Cognition   Behavior During Therapy: [facility] for tasks assessed/performed Overall Cognitive Status: Within Functional Limits for tasks assessed                       Extremity/Trunk Assessment                Exercises     Shoulder Instructions       General Comments      Pertinent Vitals/ Pain         Pt states her catheter felt uncomfortable at EOB but didn't rate. She states nursing is aware and pt reports pain is gone once she lays back down. Attempted to reposition catheter tube in sitting to make sure no pulling.   Home Living                                          Prior Functioning/Environment              Frequency Min 2X/week     Progress Toward Goals  OT Goals(current goals can now be found in the care plan  section)  Progress towards OT goals: Progressing toward goals     Plan Discharge plan remains appropriate    Co-evaluation                 End of Session     Activity Tolerance Patient limited by pain   Patient Left in bed;with call bell/phone within reach   Nurse Communication          Time: 1798-1025 OT Time Calculation (min): 17 min  Charges: OT General Charges $OT Visit: 1 Procedure OT Treatments $Self Care/Home Management : 8-22 mins  Jules Schick  [phone] 01/23/2014, 11:02 AM

## 2014-01-23 NOTE — Progress Notes (Signed)
[name] is providing the following services: Spoke with CM re:  Cpap, Shower Stool and Nebulizer.  Kara Vincent is not eligible at this time for another Cpap as she received one in 2013.  Her insurance will not cover one at this time.  She also received a tub bench in October 2012, and she is not eligible for a shower stool at this time either as Medicaid considers that same/similar item.  I gave her the private pay cost for the Cpap which is $1164.00.  She stated that she left her Cpap in her old apartment.  I recommended that she get in touch with Medicaid and explain the situation to them to see if they would possibly cover another one.  She was able to receive a nebulizer at this time which was delivered to her hospital room upon discharge.     If patient discharges after hours, please call [phone].   Linward Headland 01/23/2014, 4:09 PM

## 2014-01-23 NOTE — Plan of Care (Signed)
Problem: Phase III Progression Outcomes Goal: Pain controlled on oral analgesia Outcome: Progressing Goal: Tolerating diet Outcome: Completed/Met Date Met:  01/23/14 Goal: Other Phase III Outcomes/Goals Outcome: Not Applicable Date Met:  07/23/01

## 2014-01-24 NOTE — Progress Notes (Signed)
CARE MANAGEMENT NOTE 01/24/2014  Patient:  Kara Vincent, Kara Vincent   Account Number:  [id number]  Date Initiated:  01/18/2014  Documentation initiated by:  DAVIS,RHONDA  Subjective/Objective Assessment:   pt transferred from med surg floor to sdu due to resp distress pm of [id number] patient on Fi02 at 50% and Bipap     Action/Plan:   tbd   Anticipated DC Date:  01/21/2014   Anticipated DC Plan:  [place]  In-house referral  NA      DC Planning Services  CM consult      [facility] Choice  HOME HEALTH   Choice offered to / List presented to:  C-1 Patient   DME arranged  C[facility]  NEBULIZER MACHINE      DME agency  [place] arranged  HH-1 RN  [place].   Status of service:  Completed, signed off Medicare Important Message given?   (If response is "NO", the following Medicare IM given date fields will be blank) Date Medicare IM given:   Medicare IM given by:   Date Additional Medicare IM given:   Additional Medicare IM given by:    Discharge Disposition:  [place]  Per UR Regulation:  Reviewed for med. necessity/level of care/duration of stay  If discussed at [place] of Stay Meetings, dates discussed:    Comments:  01/23/14 MMcGibboney, RN, BSN Spoke with pt in length concerning discharge plans. Pt states that she had CPAP machine but it was left at here old address. Medicaid will not cover for a second one related to date of 2013, Pt would need to pay out of pocket for one at a cost of over $900.00 and will need to make arrangement with [place]. Pt also received tub bench 2103 and Medicaid will not pay for another one at this time. This information was shared with pt and female friend at bedside. Pt discharged with [facility] following.  01/22/2014 1438 Spoke to [facility] rep about HH and pt had services in past. Pt can have new nebulizer for home. Jonnie Finner RN CCM Case  Mgmt phone [phone]  01/22/2014 1400 NCM spoke to pt and offered choice for [facility]. Pt requested [facility] for HH. Pt states she has scooter, 3n1, hospital bed and tub bench at home. [facility] rep notified for cane. Contacted [facility] for [facility] needed for scheduled dc home today. Pt reports she has oxygen. Her CPAP was left at her old address. Contacted [facility] for CPAP replacement. CPAP received 2013.  Medicaid will not cover [facility] PT/OT.   Jonnie Finner RN CCM Case Mgmt phone [phone]  [id number] Rosana Hoes, RN, BSN, CCM Chart reviewed. Discharge needs and patient's stay to be reviewed and followed by case manager. chart note: TODAY'S SUMMARY: Acute respiratory acidosis, due to decompensated obesity hypoventilation, with diastolic heart failure. Diamox in attempt to lower pco2 to lower level. Still may need trach infuture.

## 2014-03-12 IMAGING — CR DG CHEST 2V
3 series · 3 of 3 positions shown · non-contrast
Comparison: 06/28/2011.

CLINICAL DATA: Chest pain.

CHEST - 2 VIEW

[w chest lat (1 of 2)]
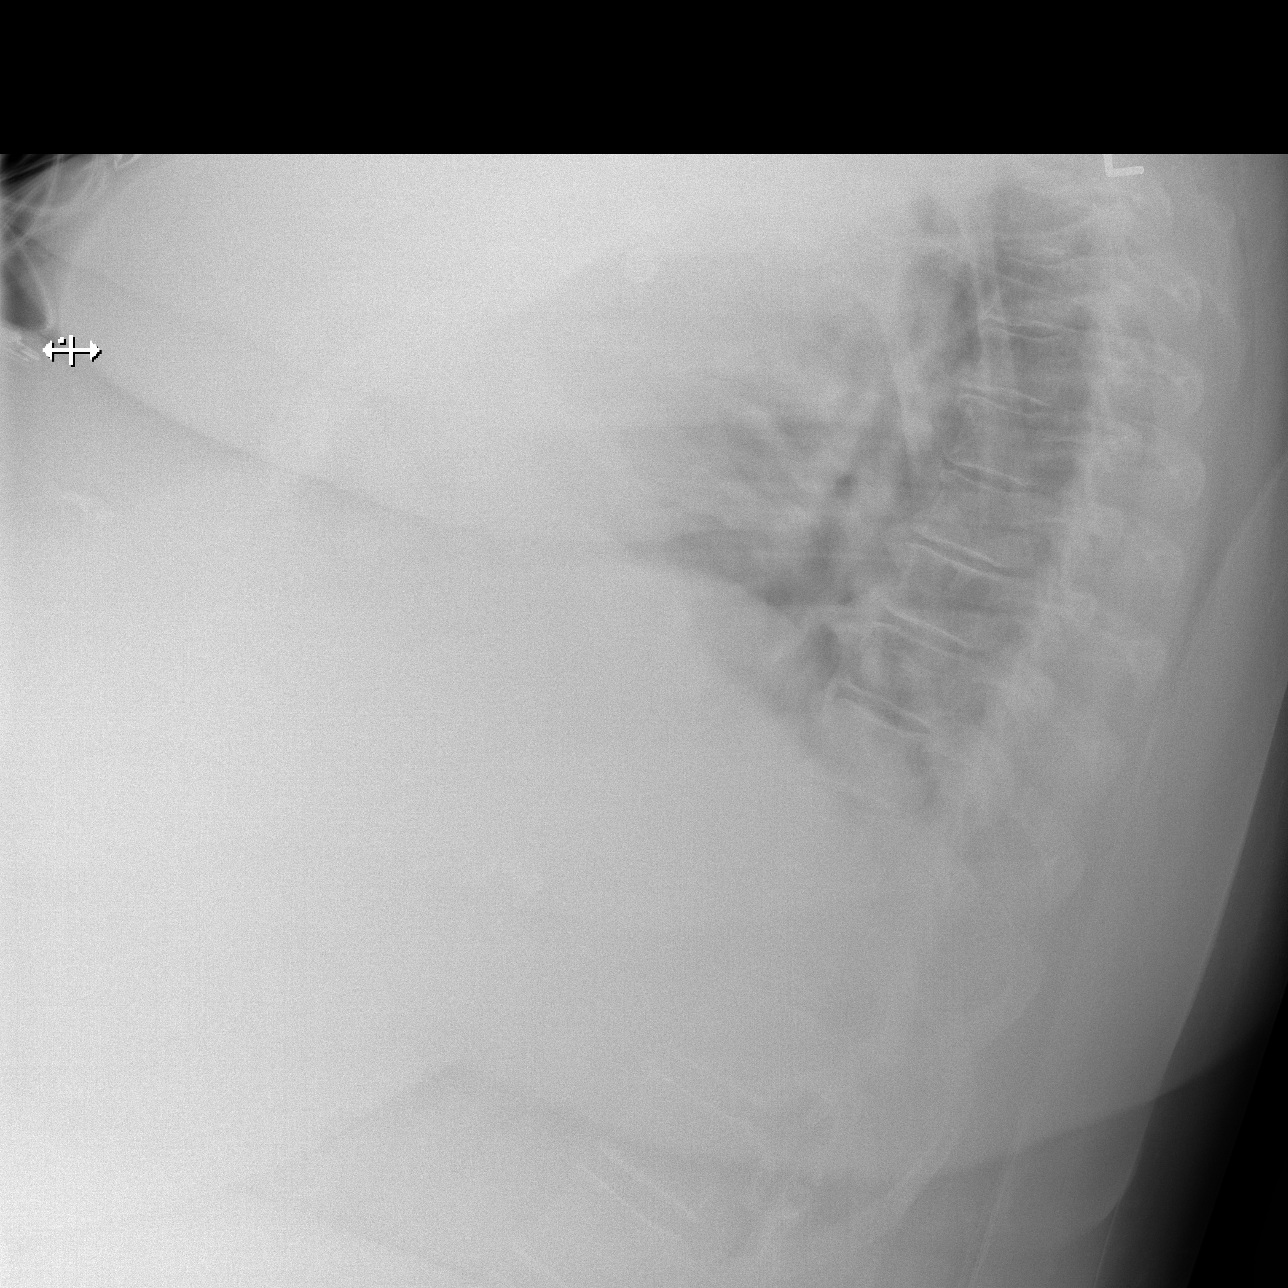

[w chest lat (2 of 2)]
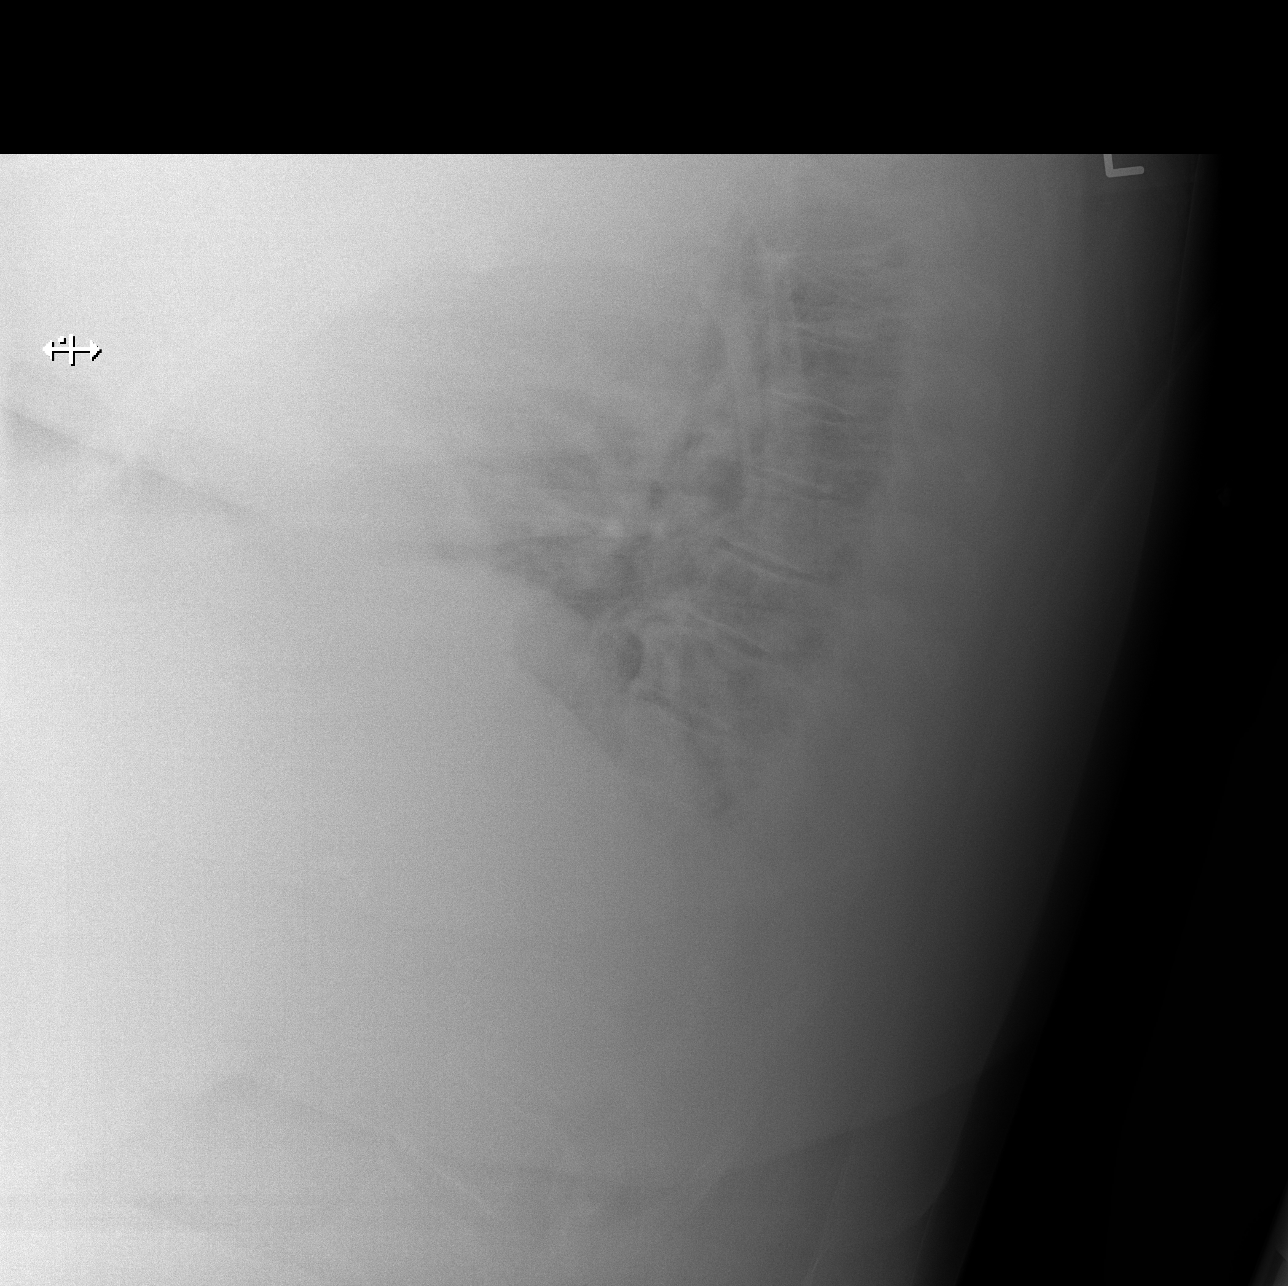

[x chest ap]
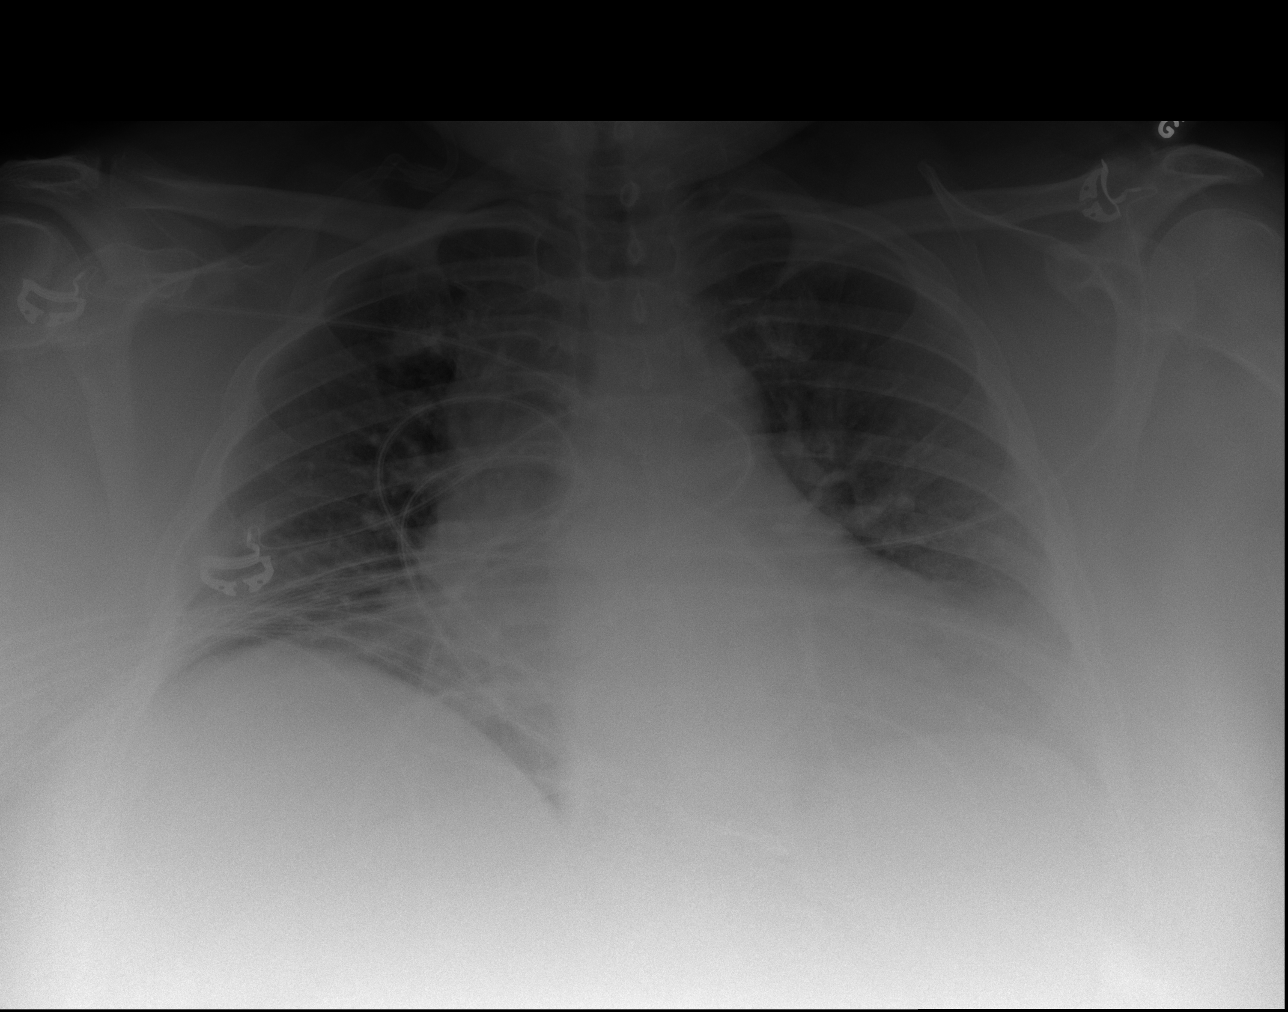

[3 of 3 positions shown; findings below may reference images not displayed]

FINDINGS: The heart is enlarged but stable.  Stable prominent
mediastinal and hilar contours.  Persistent low lung volumes with
vascular crowding and atelectasis.  No edema or effusions.
IMPRESSION: Stable cardiac enlargement.
Low lung volumes with vascular crowding and atelectasis.

## 2014-03-22 IMAGING — CR DG CHEST 2V
3 series · 3 of 3 positions shown · non-contrast
Comparison: 08/24/2011

CLINICAL DATA: Shortness of breath and left chest pain for 3-4
days.  Bilateral leg pain.  Former smoker.

CHEST - 2 VIEW

[w chest lat (1 of 2)]
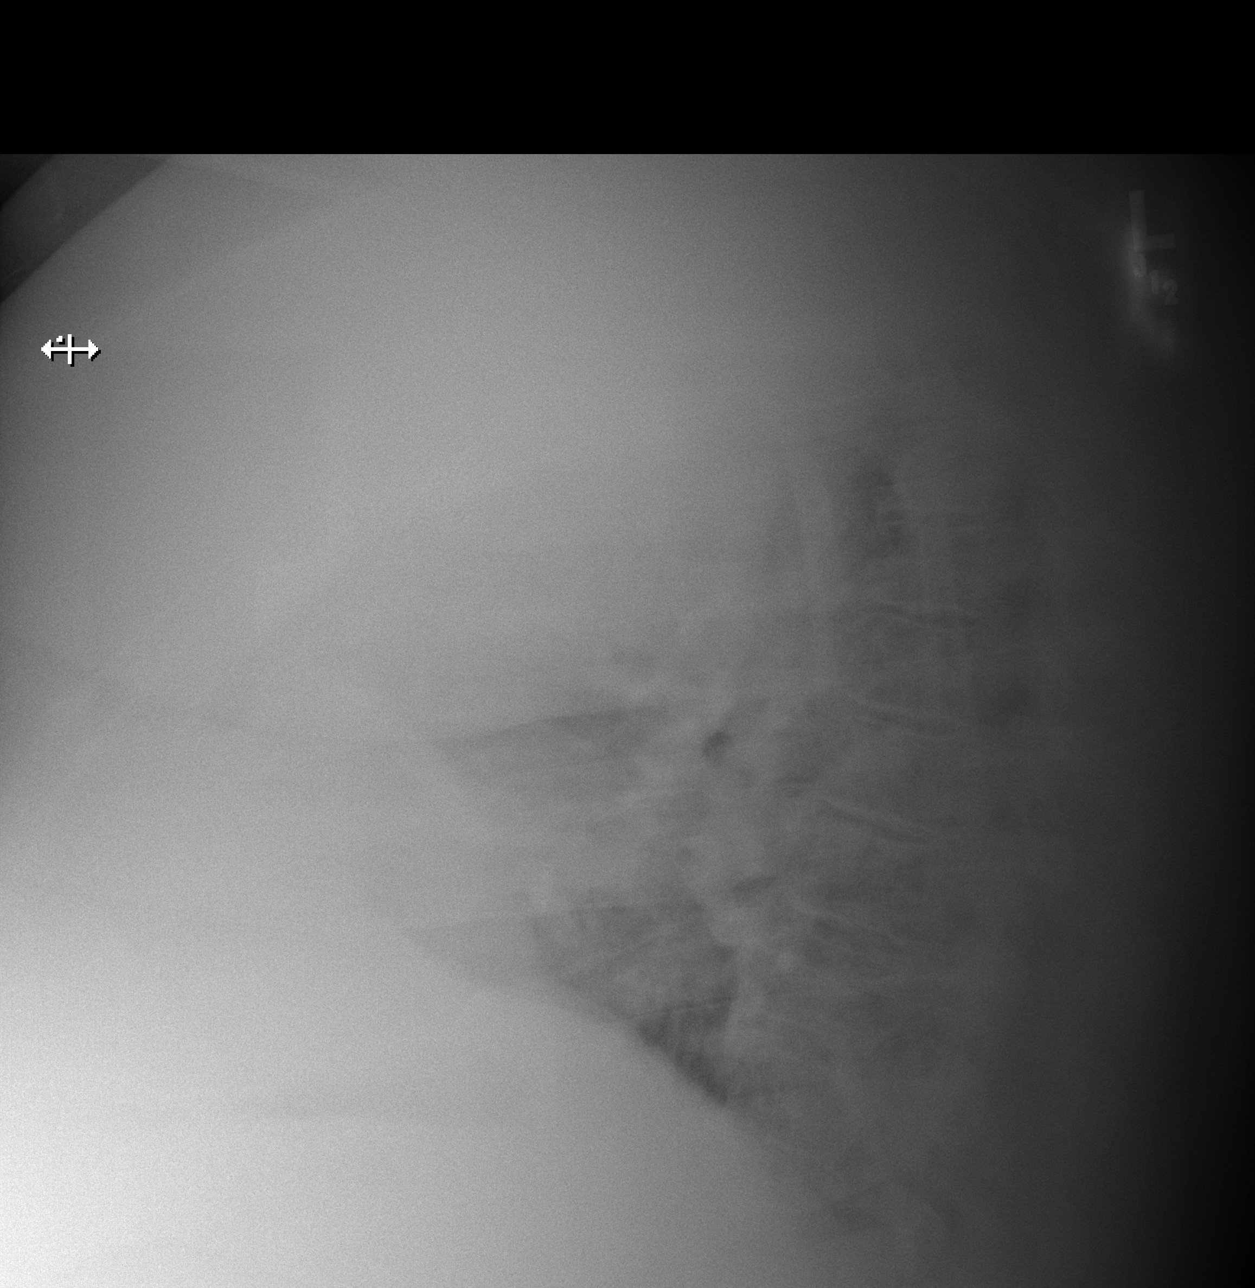

[w chest lat (2 of 2)]
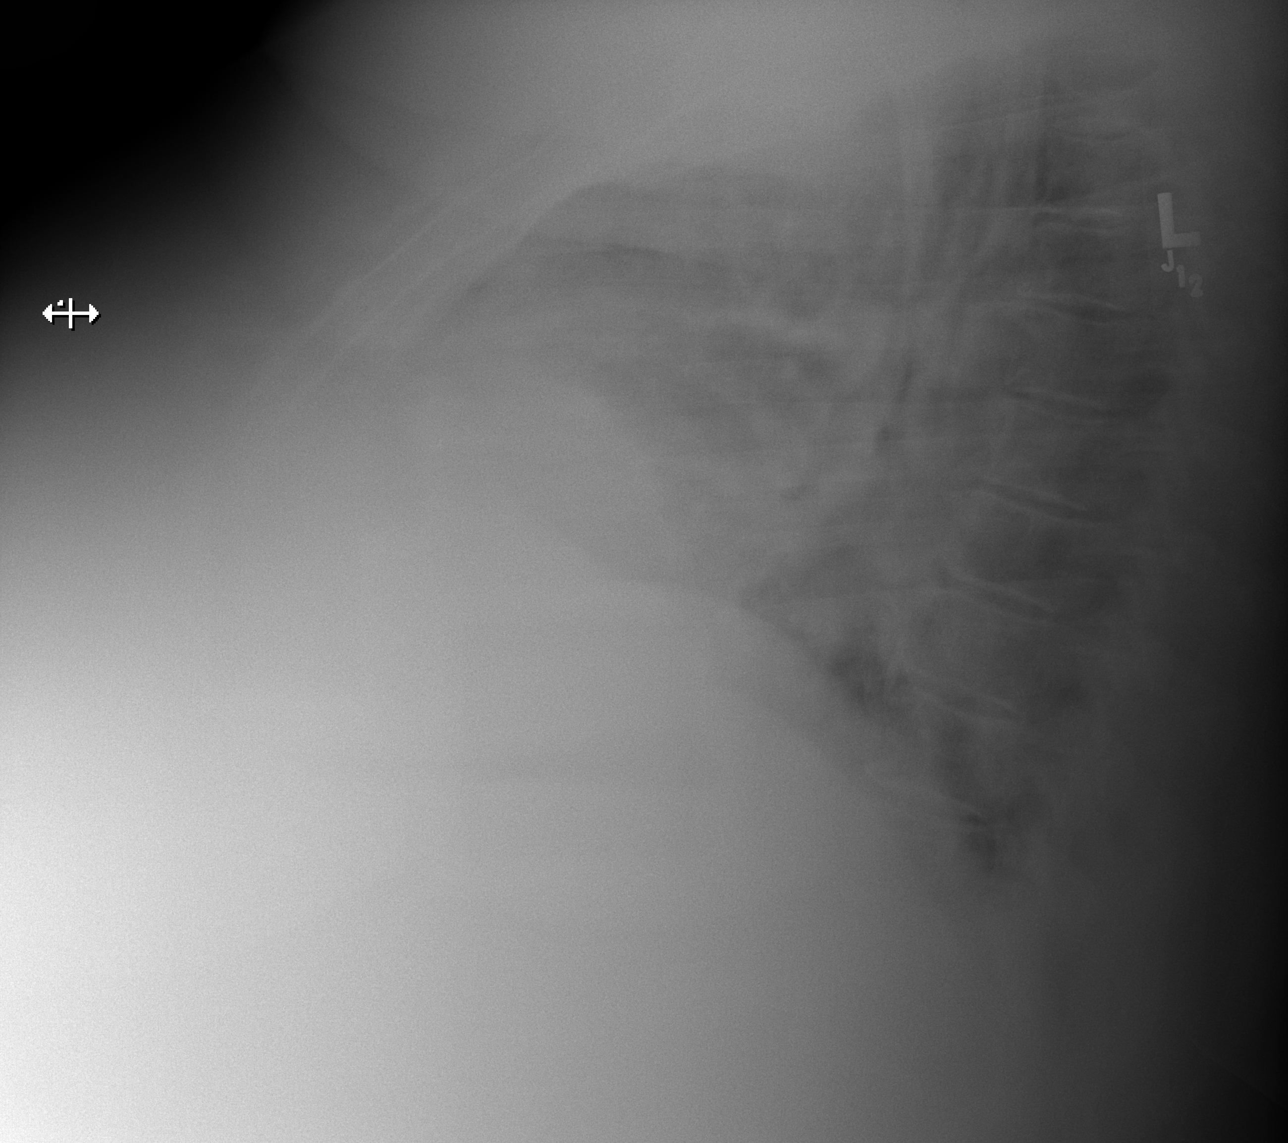

[x chest ap]
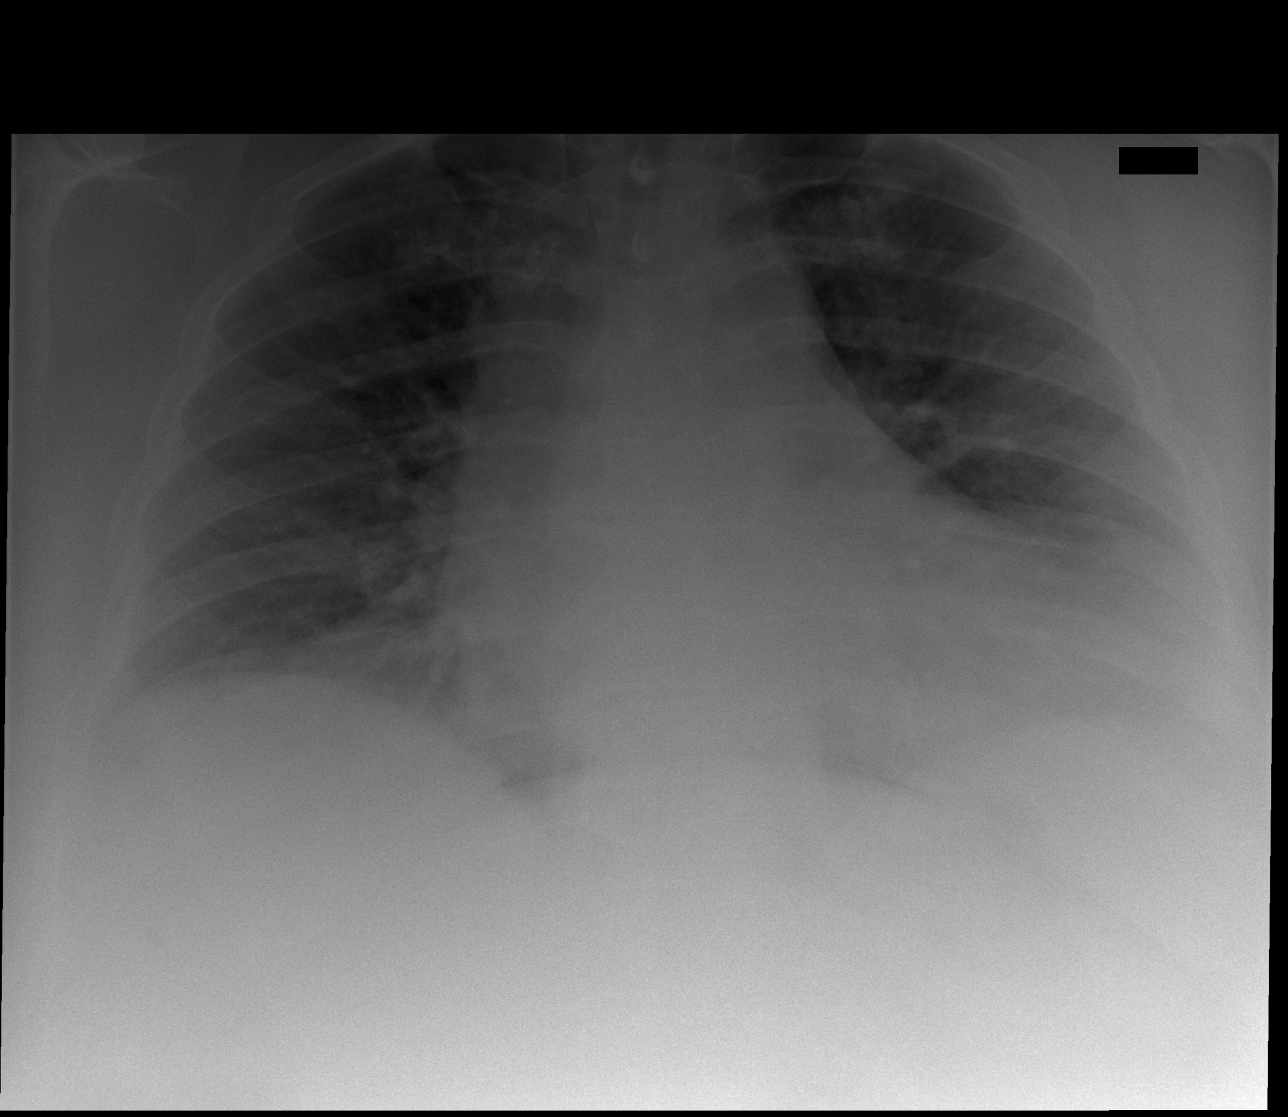

[3 of 3 positions shown; findings below may reference images not displayed]

FINDINGS: Cardiomegaly.  Mild vascular congestion. Early congestive
failure not excluded.  Similar appearance to priors.
IMPRESSION: Cardiomegaly.  Mild vascular congestion.  Early CHF not excluded.

## 2014-03-26 IMAGING — CR DG CHEST 2V
3 series · 3 of 3 positions shown · non-contrast
Comparison: Chest radiograph performed 09/03/2011

CLINICAL DATA: Chest pain.

CHEST - 2 VIEW

[w chest lat (1 of 2)]
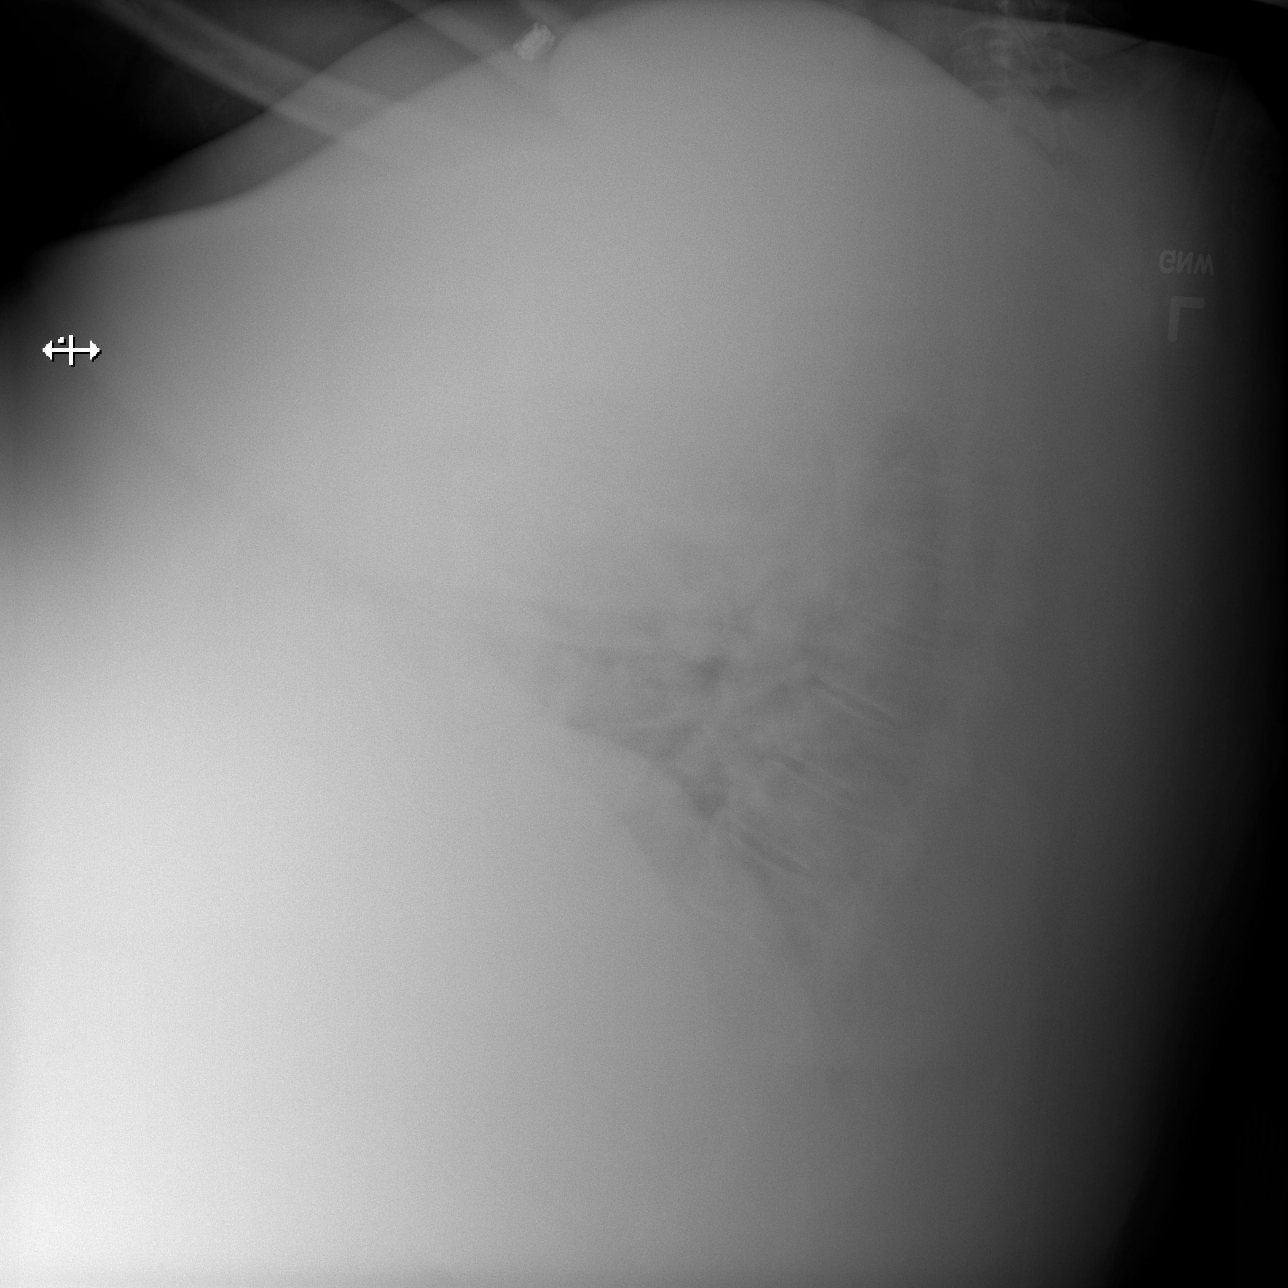

[w chest lat (2 of 2)]
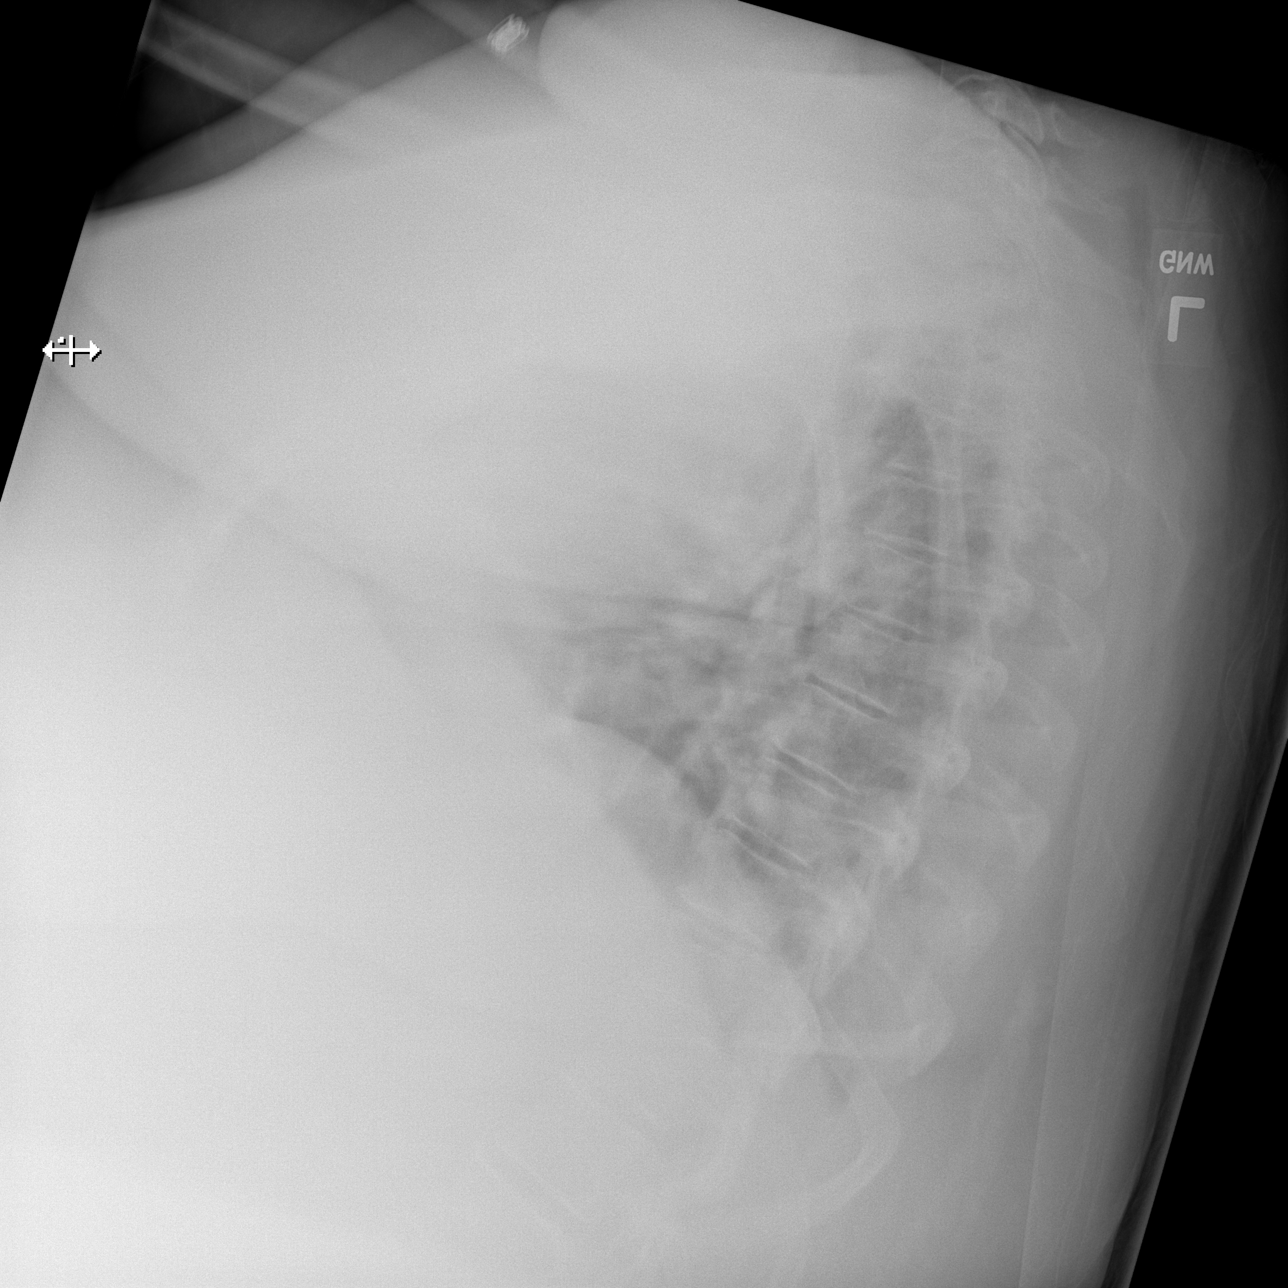

[x chest ap]
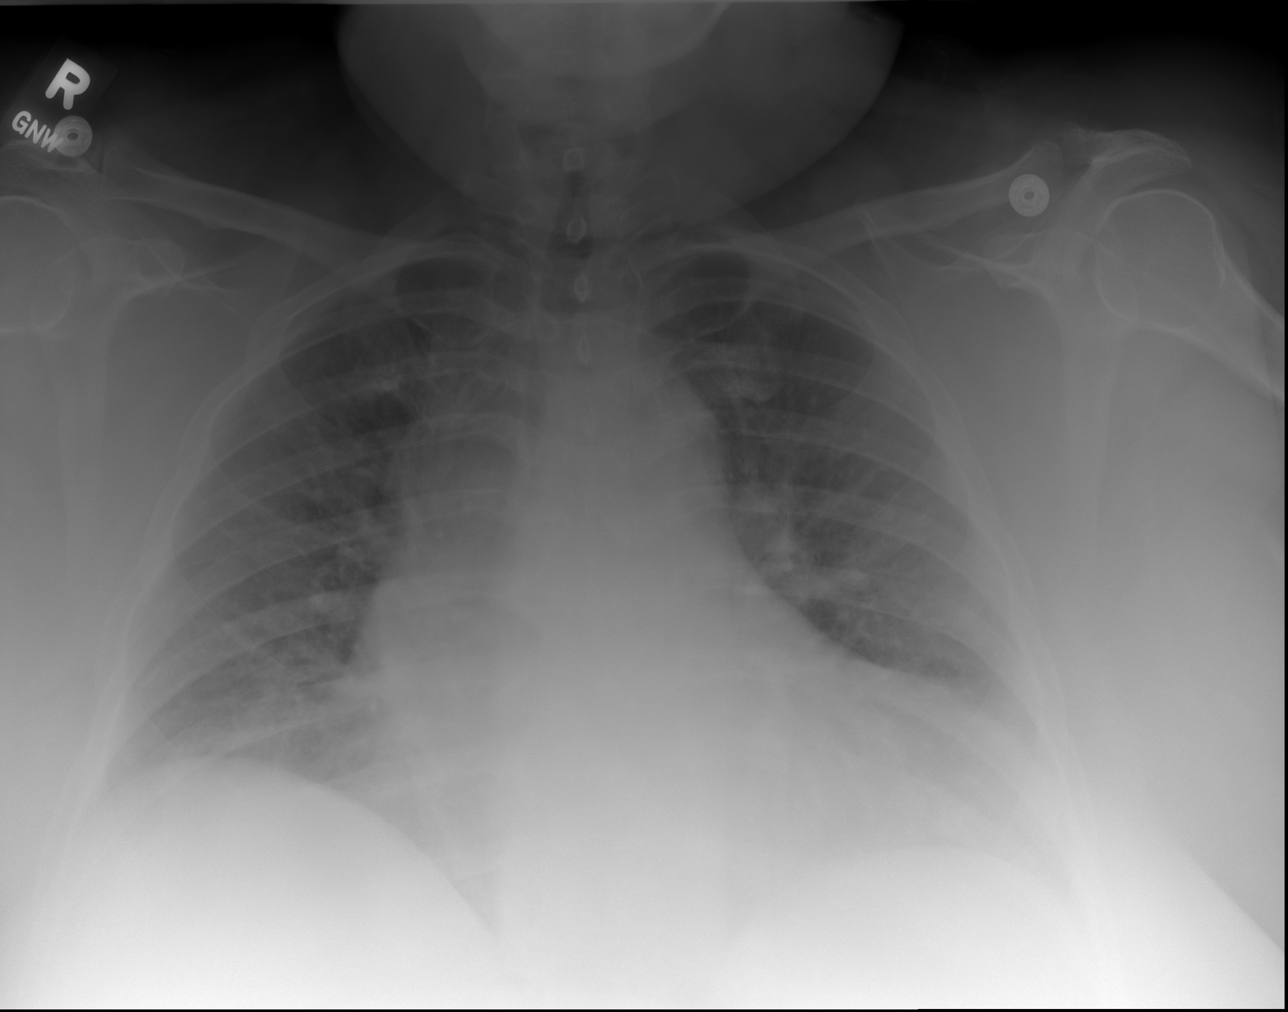

[3 of 3 positions shown; findings below may reference images not displayed]

FINDINGS: The lungs are well-aerated.  There is persistent vascular
congestion, with increased interstitial markings, raising question
for mild interstitial edema.  There is no evidence of pleural
effusion or pneumothorax.

The heart is enlarged.  No acute osseous abnormalities are seen.
IMPRESSION: Persistent vascular congestion and cardiomegaly, with increased
interstitial markings, raising question for mild interstitial
edema.

## 2014-03-27 ENCOUNTER — Emergency Department (HOSPITAL_COMMUNITY)
Admission: EM | Admit: 2014-03-27 | Discharge: 2014-03-28 | Disposition: A | Discharge status: Home / Self Care | Payer: Medicaid Other | Attending: Emergency Medicine | Admitting: Emergency Medicine

## 2014-03-27 ENCOUNTER — Emergency Department (HOSPITAL_COMMUNITY): Payer: Medicaid Other

## 2014-03-27 ENCOUNTER — Encounter (HOSPITAL_COMMUNITY): Payer: Self-pay | Admitting: Emergency Medicine

## 2014-03-27 DIAGNOSIS — M7989 Other specified soft tissue disorders: Secondary | ICD-10-CM | POA: Insufficient documentation

## 2014-03-27 DIAGNOSIS — Z8614 Personal history of Methicillin resistant Staphylococcus aureus infection: Secondary | ICD-10-CM | POA: Diagnosis not present

## 2014-03-27 DIAGNOSIS — Z79899 Other long term (current) drug therapy: Secondary | ICD-10-CM | POA: Diagnosis not present

## 2014-03-27 DIAGNOSIS — Z87891 Personal history of nicotine dependence: Secondary | ICD-10-CM | POA: Diagnosis not present

## 2014-03-27 DIAGNOSIS — H6093 Unspecified otitis externa, bilateral: Secondary | ICD-10-CM | POA: Insufficient documentation

## 2014-03-27 DIAGNOSIS — E785 Hyperlipidemia, unspecified: Secondary | ICD-10-CM | POA: Insufficient documentation

## 2014-03-27 DIAGNOSIS — J45901 Unspecified asthma with (acute) exacerbation: Secondary | ICD-10-CM | POA: Diagnosis not present

## 2014-03-27 DIAGNOSIS — Z7982 Long term (current) use of aspirin: Secondary | ICD-10-CM | POA: Insufficient documentation

## 2014-03-27 DIAGNOSIS — I509 Heart failure, unspecified: Secondary | ICD-10-CM | POA: Diagnosis not present

## 2014-03-27 DIAGNOSIS — M109 Gout, unspecified: Secondary | ICD-10-CM | POA: Insufficient documentation

## 2014-03-27 DIAGNOSIS — Z590 Homelessness: Secondary | ICD-10-CM | POA: Insufficient documentation

## 2014-03-27 DIAGNOSIS — Z880 Allergy status to penicillin: Secondary | ICD-10-CM | POA: Diagnosis not present

## 2014-03-27 DIAGNOSIS — M1990 Unspecified osteoarthritis, unspecified site: Secondary | ICD-10-CM | POA: Diagnosis not present

## 2014-03-27 DIAGNOSIS — R0602 Shortness of breath: Secondary | ICD-10-CM

## 2014-03-27 DIAGNOSIS — I10 Essential (primary) hypertension: Secondary | ICD-10-CM | POA: Insufficient documentation

## 2014-03-27 DIAGNOSIS — K219 Gastro-esophageal reflux disease without esophagitis: Secondary | ICD-10-CM | POA: Insufficient documentation

## 2014-03-27 NOTE — ED Notes (Signed)
Per EMS pt is c/o shortness of breath and cough x 4 days  Pt states she her cough is productive with yellow sputum

## 2014-03-28 LAB — I-STAT CHEM 8, ED
BUN: 20 mg/dL (ref 6–23)
CALCIUM ION: 1.11 mmol/L — AB (ref 1.12–1.23)
Chloride: 99 mEq/L (ref 96–112)
Creatinine, Ser: 1.3 mg/dL — ABNORMAL HIGH (ref 0.50–1.10)
Glucose, Bld: 116 mg/dL — ABNORMAL HIGH (ref 70–99)
HCT: 38 % (ref 36.0–46.0)
Hemoglobin: 12.9 g/dL (ref 12.0–15.0)
Potassium: 4.3 mmol/L (ref 3.5–5.1)
Sodium: 143 mmol/L (ref 135–145)
TCO2: 31 mmol/L (ref 0–100)

## 2014-03-28 LAB — CBC WITH DIFFERENTIAL/PLATELET
BASOS PCT: 0 % (ref 0–1)
Basophils Absolute: 0 10*3/uL (ref 0.0–0.1)
Eosinophils Absolute: 0.3 10*3/uL (ref 0.0–0.7)
Eosinophils Relative: 3 % (ref 0–5)
HCT: 39.7 % (ref 36.0–46.0)
Hemoglobin: 11.6 g/dL — ABNORMAL LOW (ref 12.0–15.0)
Lymphocytes Relative: 27 % (ref 12–46)
Lymphs Abs: 2.4 10*3/uL (ref 0.7–4.0)
MCH: 28.2 pg (ref 26.0–34.0)
MCHC: 29.2 g/dL — AB (ref 30.0–36.0)
MCV: 96.6 fL (ref 78.0–100.0)
Monocytes Absolute: 0.5 10*3/uL (ref 0.1–1.0)
Monocytes Relative: 5 % (ref 3–12)
NEUTROS PCT: 65 % (ref 43–77)
Neutro Abs: 5.8 10*3/uL (ref 1.7–7.7)
PLATELETS: 231 10*3/uL (ref 150–400)
RBC: 4.11 MIL/uL (ref 3.87–5.11)
RDW: 15.4 % (ref 11.5–15.5)
WBC: 9 10*3/uL (ref 4.0–10.5)

## 2014-03-28 LAB — I-STAT TROPONIN, ED: TROPONIN I, POC: 0 ng/mL (ref 0.00–0.08)

## 2014-03-28 LAB — BRAIN NATRIURETIC PEPTIDE: B Natriuretic Peptide: 21.9 pg/mL (ref 0.0–100.0)

## 2014-03-28 MED ORDER — IPRATROPIUM-ALBUTEROL 0.5-2.5 (3) MG/3ML IN SOLN
3.0000 mL | Freq: Once | RESPIRATORY_TRACT | Status: AC
  Administered 2014-03-28: 3 mL via RESPIRATORY_TRACT
  Filled 2014-03-28: qty 3

## 2014-03-28 MED ORDER — FUROSEMIDE 10 MG/ML IJ SOLN
40.0000 mg | Freq: Once | INTRAMUSCULAR | Status: DC
  Filled 2014-03-28: qty 4

## 2014-03-28 MED ORDER — PREDNISONE 20 MG PO TABS
60.0000 mg | ORAL_TABLET | Freq: Once | ORAL | Status: AC
Start: 2014-03-28 — End: 2014-03-28
  Administered 2014-03-28: 60 mg via ORAL
  Filled 2014-03-28: qty 3

## 2014-03-28 MED ORDER — PREDNISONE 10 MG PO TABS: 40.0000 mg | ORAL_TABLET | Freq: Every day | ORAL | Status: DC

## 2014-03-28 MED ORDER — CIPROFLOXACIN-DEXAMETHASONE 0.3-0.1 % OT SUSP
4.0000 [drp] | Freq: Two times a day (BID) | OTIC | Status: DC
  Administered 2014-03-28: 4 [drp] via OTIC
  Filled 2014-03-28: qty 7.5

## 2014-03-28 MED ORDER — FUROSEMIDE 10 MG/ML IJ SOLN
40.0000 mg | Freq: Once | INTRAMUSCULAR | Status: AC
  Administered 2014-03-28: 40 mg via INTRAMUSCULAR

## 2014-03-28 NOTE — ED Notes (Signed)
Communication called for transport back home.

## 2014-03-28 NOTE — Discharge Instructions (Signed)
Prednisone as prescribed until all gone. Inhaler 2 puffs every 4 hrs. Continue lasix, make sure to take daily. Avoid salt, fast food, canned foods. Call your doctor tomorrow for outpatient followup.    Peripheral Edema You have swelling in your legs (peripheral edema). This swelling is due to excess accumulation of salt and water in your body. Edema may be a sign of heart, kidney or liver disease, or a side effect of a medication. It may also be due to problems in the leg veins. Elevating your legs and using special support stockings may be very helpful, if the cause of the swelling is due to poor venous circulation. Avoid long periods of standing, whatever the cause. Treatment of edema depends on identifying the cause. Chips, pretzels, pickles and other salty foods should be avoided. Restricting salt in your diet is almost always needed. Water pills (diuretics) are often used to remove the excess salt and water from your body via urine. These medicines prevent the kidney from reabsorbing sodium. This increases urine flow. Diuretic treatment may also result in lowering of potassium levels in your body. Potassium supplements may be needed if you have to use diuretics daily. Daily weights can help you keep track of your progress in clearing your edema. You should call your caregiver for follow up care as recommended. SEEK IMMEDIATE MEDICAL CARE IF:   You have increased swelling, pain, redness, or heat in your legs.  You develop shortness of breath, especially when lying down.  You develop chest or abdominal pain, weakness, or fainting.  You have a fever. Document Released: 04/17/2004 Document Revised: 06/02/2011 Document Reviewed: 03/28/2009 [facility] Patient Information 2015 [place], [place]. This information is not intended to replace advice given to you by your health care provider. Make sure you discuss any questions you have with your health care provider. .Shortness of Breath Shortness of breath  means you have trouble breathing. It could also mean that you have a medical problem. You should get immediate medical care for shortness of breath. CAUSES   Not enough oxygen in the air such as with high altitudes or a smoke-filled room.  Certain lung diseases, infections, or problems.  Heart disease or conditions, such as angina or heart failure.  Low red blood cells (anemia).  Poor physical fitness, which can cause shortness of breath when you exercise.  Chest or back injuries or stiffness.  Being overweight.  Smoking.  Anxiety, which can make you feel like you are not getting enough air. DIAGNOSIS  Serious medical problems can often be found during your physical exam. Tests may also be done to determine why you are having shortness of breath. Tests may include:  Chest X-rays.  Lung function tests.  Blood tests.  An electrocardiogram (ECG).  An ambulatory electrocardiogram. An ambulatory ECG records your heartbeat patterns over a 24-hour period.  Exercise testing.  A transthoracic echocardiogram (TTE). During echocardiography, sound waves are used to evaluate how blood flows through your heart.  A transesophageal echocardiogram (TEE).  Imaging scans. Your health care provider may not be able to find a cause for your shortness of breath after your exam. In this case, it is important to have a follow-up exam with your health care provider as directed.  TREATMENT  Treatment for shortness of breath depends on the cause of your symptoms and can vary greatly. HOME CARE INSTRUCTIONS   Do not smoke. Smoking is a common cause of shortness of breath. If you smoke, ask for help to quit.  Avoid  being around chemicals or things that may bother your breathing, such as paint fumes and dust.  Rest as needed. Slowly resume your usual activities.  If medicines were prescribed, take them as directed for the full length of time directed. This includes oxygen and any inhaled  medicines.  Keep all follow-up appointments as directed by your health care provider. SEEK MEDICAL CARE IF:   Your condition does not improve in the time expected.  You have a hard time doing your normal activities even with rest.  You have any new symptoms. SEEK IMMEDIATE MEDICAL CARE IF:   Your shortness of breath gets worse.  You feel light-headed, faint, or develop a cough not controlled with medicines.  You start coughing up blood.  You have pain with breathing.  You have chest pain or pain in your arms, shoulders, or abdomen.  You have a fever.  You are unable to walk up stairs or exercise the way you normally do. MAKE SURE YOU:  Understand these instructions.  Will watch your condition.  Will get help right away if you are not doing well or get worse. Document Released: 12/03/2000 Document Revised: 03/15/2013 Document Reviewed: 05/26/2011 [facility] Patient Information 2015 [place], [place]. This information is not intended to replace advice given to you by your health care provider. Make sure you discuss any questions you have with your health care provider.

## 2014-03-28 NOTE — ED Notes (Signed)
At bedside to start IV unsuccessful x 1 venipuncture. PA at bedside and given order to give Lasix IM instead of IV.

## 2014-03-28 NOTE — ED Notes (Signed)
Awake. Verbally responsive. A/O x4. Resp even and slightly labored. Occ exp audible wheezing noted. ABC's intact. SR on monitor at 68bpm.

## 2014-03-28 NOTE — ED Notes (Signed)
Resting quietly with eye closed. Easily arousable. Verbally responsive. Resp even and unlabored. ABC's intact. NAD noted.  

## 2014-03-28 NOTE — ED Notes (Signed)
Awake. Verbally responsive. A/O x4. Resp even and unlabored. No audible adventitious breath sounds noted. ABC's intact.  

## 2014-03-28 NOTE — ED Notes (Signed)
Awake. Verbally responsive. A/O x4. Resp even and unlabored. No audible adventitious breath sounds noted. ABC's intact. SR on monitor at 75bpm

## 2014-03-28 NOTE — ED Provider Notes (Signed)
CSN: [id number]     Arrival date & time 03/27/14  2211 History   First MD Initiated Contact with Patient 03/27/14 2331     Chief Complaint  Patient presents with  . Shortness of Breath  . Cough     (Consider location/radiation/quality/duration/timing/severity/associated sxs/prior Treatment) HPI Kara Vincent is a 49 y.o. female with history of asthma, Darier disease, CHF, morbid obesity, presented to emergency department complaining of cough, shortness of breath, swelling in extremities. She is unsure how much weight she has gained, states she does not have a scale to weigh herself. She states that she began swelling approximately 2 weeks ago. She reports cough and congestion that started 3 days ago. She states that she is coughing up thick yellow sputum. She has not tried any medications for her symptoms. She denies any chest pain. No palpitations. No dizziness. No other complaints.  Past Medical History  Diagnosis Date  . Asthma   . GERD (gastroesophageal reflux disease)   . Hypertension   . Homelessness   . Low back pain   . 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 Arthritis   . Gout   . Hyperlipemia   . Morbid obesity     uses motor wheel chair  . MRSA (methicillin resistant Staphylococcus aureus)     states about a year ago  . CHF (congestive heart failure)    Past Surgical History  Procedure Laterality Date  . Tubal ligation    . Cesarean section     Family History  Problem Relation Age of Onset  . Asthma Mother   . Diabetes Father   . Heart disease Father   . Stroke Father    History  Substance Use Topics  . Smoking status: Former Smoker    Types: Cigarettes    Quit date: 03/25/2003  . Smokeless tobacco: Former Systems developer    Quit date: 05/27/1978  . Alcohol Use: No   OB History    No data available     Review of Systems  Constitutional: Negative for fever and chills.  Respiratory: Positive for cough, chest tightness and shortness of breath.  Negative for wheezing.   Cardiovascular: Positive for chest pain and leg swelling. Negative for palpitations.  Gastrointestinal: Negative for nausea, vomiting, abdominal pain and diarrhea.  Genitourinary: Negative for dysuria, flank pain and pelvic pain.  Musculoskeletal: Negative for myalgias, arthralgias, neck pain and neck stiffness.  Skin: Negative for rash.  Neurological: Negative for dizziness, weakness and headaches.  All other systems reviewed and are negative.     Allergies  Ibuprofen; Adhesive; Azithromycin; Doxycycline; Penicillins; Sulfa antibiotics; and Sulfamethoxazole-trimethoprim  Home Medications   Prior to Admission medications   Medication Sig Start Date End Date Taking? Authorizing Provider  albuterol (PROVENTIL HFA) 108 (90 BASE) MCG/ACT inhaler Inhale 2 puffs into the lungs every 4 (four) hours as needed for wheezing or shortness of breath. 01/22/14   Donne Hazel, MD  albuterol (PROVENTIL) (2.5 MG/3ML) 0.083% nebulizer solution Take 2.5 mg by nebulization every 6 (six) hours as needed for wheezing or shortness of breath.    Historical Provider, MD  allopurinol (ZYLOPRIM) 100 MG tablet Take 1 tablet (100 mg total) by mouth every morning. 01/22/14   Donne Hazel, MD  ammonium lactate (AMLACTIN) 12 % cream Apply 1 g topically daily as needed for dry skin.     Historical Provider, MD  aspirin 325 MG tablet Take 325 mg by mouth every morning.  Historical Provider, MD  atorvastatin (LIPITOR) 20 MG tablet Take 20 mg by mouth every morning.     Historical Provider, MD  atorvastatin (LIPITOR) 20 MG tablet Take 1 tablet (20 mg total) by mouth every morning. 01/22/14   Donne Hazel, MD  clonazePAM (KLONOPIN) 1 MG tablet Take 1 mg by mouth 2 (two) times daily.    Historical Provider, MD  diphenhydrAMINE (BENADRYL) 25 mg capsule Take 25 mg by mouth every 6 (six) hours as needed for itching or allergies.     Historical Provider, MD  Emollient (EUCERIN) lotion Apply 5 mLs  topically 2 (two) times daily as needed for dry skin.     Historical Provider, MD  folic acid (FOLVITE) 1 MG tablet Take 1 tablet (1 mg total) by mouth daily. 01/22/14   Donne Hazel, MD  furosemide (LASIX) 40 MG tablet Take 40 mg by mouth 2 (two) times daily.    Historical Provider, MD  furosemide (LASIX) 40 MG tablet Take 1 tablet (40 mg total) by mouth 2 (two) times daily. 01/22/14   Donne Hazel, MD  guaiFENesin (MUCINEX) 600 MG 12 hr tablet Take 600 mg by mouth 2 (two) times daily.    Historical Provider, MD  HYDROcodone-acetaminophen (NORCO) 10-325 MG per tablet Take 1 tablet by mouth every 6 (six) hours as needed for pain.    Historical Provider, MD  hydrOXYzine (ATARAX/VISTARIL) 25 MG tablet Take 25 mg by mouth 3 (three) times daily as needed for anxiety or itching.    Historical Provider, MD  ketoconazole (NIZORAL) 2 % shampoo Apply 1 application topically 2 (two) times a week.    Historical Provider, MD  ketoconazole (NIZORAL) 200 MG tablet Take 200 mg by mouth every morning.     Historical Provider, MD  ketorolac (TORADOL) 10 MG tablet Take 1 tablet (10 mg total) by mouth every 6 (six) hours as needed for severe pain. 01/23/14   Donne Hazel, MD  Multiple Vitamin (MULTIVITAMIN WITH MINERALS) TABS tablet Take 1 tablet by mouth daily. 01/22/14   Donne Hazel, MD  pantoprazole (PROTONIX) 40 MG tablet Take 40 mg by mouth every morning.     Historical Provider, MD  pantoprazole (PROTONIX) 40 MG tablet Take 1 tablet (40 mg total) by mouth every morning. 01/22/14   Donne Hazel, MD  solifenacin (VESICARE) 5 MG tablet Take 5 mg by mouth every morning.     Historical Provider, MD  vitamin B-12 (CYANOCOBALAMIN) 1000 MCG tablet Take 1 tablet (1,000 mcg total) by mouth every morning. 01/22/14   Donne Hazel, MD  vitamin E 100 UNIT capsule Take 100 Units by mouth every morning.    Historical Provider, MD  vitamin E 100 UNIT capsule Take 1 capsule (100 Units total) by mouth every morning. 01/22/14    Donne Hazel, MD   BP 157/94 mmHg  Pulse 78  Temp(Src) 98.5 F (36.9 C) (Oral)  Resp 26  SpO2 96%  LMP 03/24/2009 Physical Exam  Constitutional: She appears well-developed and well-nourished. No distress.  Morbidly obese  HENT:  Head: Normocephalic.  Nose: Nose normal.  Mouth/Throat: Oropharynx is clear and moist.  Bilateral otitis externa  Eyes: Conjunctivae are normal.  Neck: Normal range of motion. Neck supple.  Cardiovascular: Normal rate, regular rhythm and normal heart sounds.   Pulmonary/Chest: Effort normal. No respiratory distress. She has wheezes.  Distant breath sounds  Abdominal: Soft. Bowel sounds are normal. She exhibits no distension. There is no tenderness. There is  no rebound.  Musculoskeletal:  No pitting edema bilaterally, difficulty examining due to her body habitus  Neurological: She is alert.  Skin: Skin is warm and dry.  Psychiatric: She has a normal mood and affect. Her behavior is normal.  Nursing note and vitals reviewed.   ED Course  Procedures (including critical care time) Labs Review Labs Reviewed  CBC WITH DIFFERENTIAL - Abnormal; Notable for the following:    Hemoglobin 11.6 (*)    MCHC 29.2 (*)    All other components within normal limits  I-STAT CHEM 8, ED - Abnormal; Notable for the following:    Creatinine, Ser 1.30 (*)    Glucose, Bld 116 (*)    Calcium, Ion 1.11 (*)    All other components within normal limits  BRAIN NATRIURETIC PEPTIDE  I-STAT TROPOININ, ED    Imaging Review Dg Chest 2 View  03/28/2014   CLINICAL DATA:  Subacute onset of cough and shortness of breath for 2 weeks. Initial encounter.  EXAM: CHEST  2 VIEW  COMPARISON:  Chest radiograph performed 01/13/2014  FINDINGS: The lungs are hypoexpanded. Vascular congestion is noted. Mild bibasilar opacities could reflect minimal interstitial edema. There is no evidence of pleural effusion or pneumothorax.  The heart is mildly enlarged. No acute osseous abnormalities are  seen.  IMPRESSION: Lungs hypoexpanded. Vascular congestion and mild cardiomegaly. Mild bibasilar airspace opacities could reflect minimal interstitial edema.   Electronically Signed   By: Garald Balding M.D.   On: 03/28/2014 00:11     EKG Interpretation   Date/Time:  Monday March 27 2014 23:46:24 EST Ventricular Rate:  71 PR Interval:  145 QRS Duration: 98 QT Interval:  422 QTC Calculation: 459 R Axis:   54 Text Interpretation:  Sinus rhythm Atrial premature complexes Low voltage,  precordial leads No significant change since last tracing Confirmed by  Glynn Octave [id number]) on 03/28/2014 1:17:48 AM      MDM   Final diagnoses:  Shortness of breath  Otitis externa, bilateral   Pt is here with cough, congestion, states "i am retaining fluids."  Unclear if she is taking her lasix, at first told me she was not, then she said she was but still couldn't tell me if she takes it as prescribed twice a day. Pt Coughing up yellow sputum. Will get CXR, will try a neb.   1:33 AM CXR showing vascular congestion. Labs unremarkable Her VS are normal. She is on home Oxygen and oxygen sat in high 90s. In fact at one pt, pt was off of oxygen and was 95% on RA. She is not tachypnec or tachycardic. She is speaking in full sentences. Ordered Lasix IV in ED. BNP pending. She is only up 2 lb from prior weight when discharged.   1:56 AM Pt stable for d/c home. ciprodex for otitis externa. Short prednisone burst. Call PCP tomorrow for close follow up. Diet and fluid restriction discussed. Return precautions discussed. No indication for admission on todays visit, however, given her history discussed importance of close follow up and returning for worsening symptoms. She was not happy about going home, Dr. Claudine Mouton talked to pt as well, and pt will be discharged  Filed Vitals:   03/27/14 2212 03/27/14 2213 03/28/14 0100 03/28/14 0109  BP: 157/94  104/63   Pulse: 78  71   Temp: 98.5 F (36.9 C)      TempSrc: Oral     Resp: 26  22   Weight:    415 lb (188.243  kg)  SpO2: 95% 96% 96%        Renold Genta, PA-C 03/28/14 0159  Everlene Balls, MD 03/28/14 [phone]

## 2014-03-28 NOTE — ED Notes (Signed)
PA at bedside to give pt test results and explain to pt that she was not a candidate to be admitted at this time based on clinical exam and test results.. Pt upset because she believes she needs to be admitted.

## 2014-03-28 NOTE — ED Notes (Signed)
Resting quietly with eye closed. Easily arousable. Verbally responsive. Resp even and unlabored. ABC's intact. SR on monitor ast 70bpm.

## 2014-04-21 ENCOUNTER — Emergency Department (HOSPITAL_COMMUNITY)
Admission: EM | Admit: 2014-04-21 | Discharge: 2014-04-22 | Disposition: A | Discharge status: Home / Self Care | Payer: Medicaid Other | Attending: Emergency Medicine | Admitting: Emergency Medicine

## 2014-04-21 ENCOUNTER — Encounter (HOSPITAL_COMMUNITY): Payer: Self-pay | Admitting: Emergency Medicine

## 2014-04-21 DIAGNOSIS — J45909 Unspecified asthma, uncomplicated: Secondary | ICD-10-CM | POA: Diagnosis not present

## 2014-04-21 DIAGNOSIS — Z79899 Other long term (current) drug therapy: Secondary | ICD-10-CM | POA: Diagnosis not present

## 2014-04-21 DIAGNOSIS — L732 Hidradenitis suppurativa: Secondary | ICD-10-CM | POA: Diagnosis not present

## 2014-04-21 DIAGNOSIS — R0602 Shortness of breath: Secondary | ICD-10-CM | POA: Diagnosis not present

## 2014-04-21 DIAGNOSIS — I509 Heart failure, unspecified: Secondary | ICD-10-CM | POA: Insufficient documentation

## 2014-04-21 DIAGNOSIS — Z590 Homelessness: Secondary | ICD-10-CM | POA: Insufficient documentation

## 2014-04-21 DIAGNOSIS — M109 Gout, unspecified: Secondary | ICD-10-CM | POA: Diagnosis not present

## 2014-04-21 DIAGNOSIS — M1990 Unspecified osteoarthritis, unspecified site: Secondary | ICD-10-CM | POA: Insufficient documentation

## 2014-04-21 DIAGNOSIS — L2989 Other pruritus: Secondary | ICD-10-CM

## 2014-04-21 DIAGNOSIS — Z7952 Long term (current) use of systemic steroids: Secondary | ICD-10-CM | POA: Insufficient documentation

## 2014-04-21 DIAGNOSIS — Z7982 Long term (current) use of aspirin: Secondary | ICD-10-CM | POA: Diagnosis not present

## 2014-04-21 DIAGNOSIS — Z87891 Personal history of nicotine dependence: Secondary | ICD-10-CM | POA: Diagnosis not present

## 2014-04-21 DIAGNOSIS — Z880 Allergy status to penicillin: Secondary | ICD-10-CM | POA: Diagnosis not present

## 2014-04-21 DIAGNOSIS — E785 Hyperlipidemia, unspecified: Secondary | ICD-10-CM | POA: Diagnosis not present

## 2014-04-21 DIAGNOSIS — R21 Rash and other nonspecific skin eruption: Secondary | ICD-10-CM | POA: Diagnosis present

## 2014-04-21 DIAGNOSIS — Z8614 Personal history of Methicillin resistant Staphylococcus aureus infection: Secondary | ICD-10-CM | POA: Diagnosis not present

## 2014-04-21 DIAGNOSIS — I10 Essential (primary) hypertension: Secondary | ICD-10-CM | POA: Insufficient documentation

## 2014-04-21 DIAGNOSIS — Q828 Other specified congenital malformations of skin: Secondary | ICD-10-CM | POA: Diagnosis not present

## 2014-04-21 DIAGNOSIS — L298 Other pruritus: Secondary | ICD-10-CM

## 2014-04-21 DIAGNOSIS — K219 Gastro-esophageal reflux disease without esophagitis: Secondary | ICD-10-CM | POA: Diagnosis not present

## 2014-04-21 LAB — BASIC METABOLIC PANEL
Anion gap: 7 (ref 5–15)
BUN: 18 mg/dL (ref 6–23)
CALCIUM: 8.5 mg/dL (ref 8.4–10.5)
CO2: 30 mmol/L (ref 19–32)
CREATININE: 1.13 mg/dL — AB (ref 0.50–1.10)
Chloride: 102 mmol/L (ref 96–112)
GFR calc Af Amer: 65 mL/min — ABNORMAL LOW (ref >90)
GFR calc non Af Amer: 56 mL/min — ABNORMAL LOW (ref >90)
Glucose, Bld: 122 mg/dL — ABNORMAL HIGH (ref 70–99)
Potassium: 4.2 mmol/L (ref 3.5–5.1)
Sodium: 139 mmol/L (ref 135–145)

## 2014-04-21 LAB — CBC
HEMATOCRIT: 44 % (ref 36.0–46.0)
Hemoglobin: 13.2 g/dL (ref 12.0–15.0)
MCH: 28.9 pg (ref 26.0–34.0)
MCHC: 30 g/dL (ref 30.0–36.0)
MCV: 96.5 fL (ref 78.0–100.0)
Platelets: 222 10*3/uL (ref 150–400)
RBC: 4.56 MIL/uL (ref 3.87–5.11)
RDW: 15.2 % (ref 11.5–15.5)
WBC: 12 10*3/uL — AB (ref 4.0–10.5)

## 2014-04-21 LAB — BRAIN NATRIURETIC PEPTIDE: B Natriuretic Peptide: 36 pg/mL (ref 0.0–100.0)

## 2014-04-21 NOTE — ED Notes (Signed)
Pt c/o fluid swelling all over body. States she is having pain in her lungs and chest for the past 3 days. Pt also states she has "knots" in her vagina and one under her R arm. States she has hx of abscesses, but this doesn't feel like it. Pt arrives in her electric wheelchair.

## 2014-04-22 ENCOUNTER — Emergency Department (HOSPITAL_COMMUNITY): Payer: Medicaid Other

## 2014-04-22 LAB — TROPONIN I: Troponin I: <0.03 ng/mL (ref <0.031)

## 2014-04-22 MED ORDER — AMMONIUM LACTATE 12 % EX CREA: 1.0000 g | TOPICAL_CREAM | Freq: Every day | CUTANEOUS | Status: DC | PRN

## 2014-04-22 MED ORDER — HYDROXYZINE HCL 25 MG PO TABS
25.0000 mg | ORAL_TABLET | Freq: Once | ORAL | Status: AC
  Administered 2014-04-22: 25 mg via ORAL
  Filled 2014-04-22: qty 1

## 2014-04-22 MED ORDER — ACETAMINOPHEN 500 MG PO TABS
1000.0000 mg | ORAL_TABLET | Freq: Once | ORAL | Status: AC
  Administered 2014-04-22: 1000 mg via ORAL
  Filled 2014-04-22: qty 2

## 2014-04-22 MED ORDER — EUCERIN EX LOTN: 5.0000 mL | TOPICAL_LOTION | Freq: Two times a day (BID) | CUTANEOUS | Status: DC | PRN

## 2014-04-22 NOTE — ED Notes (Signed)
Pt is officially d/c'd, however ptar will not transport pt's motorized w/c on ambulance... Charge nurse and Md stated ok for pt to stay in room 18 while ED census is low. Pt informed of this.

## 2014-04-22 NOTE — ED Provider Notes (Signed)
CSN: [id number]     Arrival date & time 04/21/14  1950 History   First MD Initiated Contact with Patient 04/22/14 0038     No chief complaint on file.    (Consider location/radiation/quality/duration/timing/severity/associated sxs/prior Treatment) HPI 49 year old female presents to the emergency department with multiple complaints.  Patient complains of her whole body swelling ongoing for several weeks.  She reports feels that she is retaining fluid.  She reports that she is taking her Lasix as prescribed, but is having difficulties buttoning her pants and her shirt.  Patient has chronic shortness of breath, wears oxygen asleep at night.  She has a history of sleep apnea.  She reports several months ago she lost her C Pap machine in a move and has not yet been able to replace it.  Patient also complaining of knots to the upper part of her vagina and her right axilla.  She has history of abscesses.  Patient has chronic rash that she she has been seen by dermatology in the past, but has run out of her medications.  Patient also complains of intermittent tingling from her shoulder to her elbow.  Her right arm over the last 2-3 days.  No tingling at this time.  No weakness.  Patient has local primary care doctor, has not seen him for these complaints.  Patient seen in the emergency department with complaint of body swelling several weeks ago, workup at that time was unremarkable.  Patient reports that she required a ICU admission a few months ago for body swelling, and feels that she is heading that way. Past Medical History  Diagnosis Date  . Asthma   . GERD (gastroesophageal reflux disease)   . Hypertension   . Homelessness   . Low back pain   . 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 Arthritis   . Gout   . Hyperlipemia   . Morbid obesity     uses motor wheel chair  . MRSA (methicillin resistant Staphylococcus aureus)     states about a year ago  . CHF (congestive heart failure)     Past Surgical History  Procedure Laterality Date  . Tubal ligation    . Cesarean section     Family History  Problem Relation Age of Onset  . Asthma Mother   . Diabetes Father   . Heart disease Father   . Stroke Father    History  Substance Use Topics  . Smoking status: Former Smoker    Types: Cigarettes    Quit date: 03/25/2003  . Smokeless tobacco: Former Systems developer    Quit date: 05/27/1978  . Alcohol Use: No   OB History    No data available     Review of Systems  See History of Present Illness; otherwise all other systems are reviewed and negative   Allergies  Ibuprofen; Adhesive; Azithromycin; Doxycycline; Penicillins; Sulfa antibiotics; and Sulfamethoxazole-trimethoprim  Home Medications   Prior to Admission medications   Medication Sig Start Date End Date Taking? Authorizing Provider  albuterol (PROVENTIL HFA) 108 (90 BASE) MCG/ACT inhaler Inhale 2 puffs into the lungs every 4 (four) hours as needed for wheezing or shortness of breath. 01/22/14  Yes Donne Hazel, MD  albuterol (PROVENTIL) (2.5 MG/3ML) 0.083% nebulizer solution Take 2.5 mg by nebulization every 6 (six) hours as needed for wheezing or shortness of breath.   Yes Historical Provider, MD  allopurinol (ZYLOPRIM) 100 MG tablet Take 1 tablet (100 mg total) by  mouth every morning. 01/22/14  Yes Donne Hazel, MD  ammonium lactate (AMLACTIN) 12 % cream Apply 1 g topically daily as needed for dry skin.    Yes Historical Provider, MD  aspirin 325 MG tablet Take 325 mg by mouth every morning.    Yes Historical Provider, MD  atorvastatin (LIPITOR) 20 MG tablet Take 20 mg by mouth every morning.    Yes Historical Provider, MD  clonazePAM (KLONOPIN) 1 MG tablet Take 1 mg by mouth 2 (two) times daily.   Yes Historical Provider, MD  diphenhydrAMINE (BENADRYL) 25 mg capsule Take 25 mg by mouth every 6 (six) hours as needed for itching or allergies.    Yes Historical Provider, MD  folic acid (FOLVITE) 1 MG tablet Take 1  tablet (1 mg total) by mouth daily. 01/22/14  Yes Donne Hazel, MD  furosemide (LASIX) 40 MG tablet Take 40 mg by mouth 2 (two) times daily.   Yes Historical Provider, MD  guaiFENesin (MUCINEX) 600 MG 12 hr tablet Take 600 mg by mouth 2 (two) times daily.   Yes Historical Provider, MD  HYDROcodone-acetaminophen (NORCO) 10-325 MG per tablet Take 1 tablet by mouth every 6 (six) hours as needed for pain.   Yes Historical Provider, MD  hydrOXYzine (ATARAX/VISTARIL) 25 MG tablet Take 25 mg by mouth 3 (three) times daily as needed for anxiety or itching.   Yes Historical Provider, MD  ketoconazole (NIZORAL) 2 % shampoo Apply 1 application topically 2 (two) times a week.   Yes Historical Provider, MD  ketoconazole (NIZORAL) 200 MG tablet Take 200 mg by mouth every morning.    Yes Historical Provider, MD  ketorolac (TORADOL) 10 MG tablet Take 1 tablet (10 mg total) by mouth every 6 (six) hours as needed for severe pain. 01/23/14  Yes Donne Hazel, MD  Multiple Vitamin (MULTIVITAMIN WITH MINERALS) TABS tablet Take 1 tablet by mouth daily. 01/22/14  Yes Donne Hazel, MD  pantoprazole (PROTONIX) 40 MG tablet Take 40 mg by mouth every morning.    Yes Historical Provider, MD  predniSONE (DELTASONE) 10 MG tablet Take 4 tablets (40 mg total) by mouth daily. 03/28/14  Yes Tatyana A Kirichenko, PA-C  solifenacin (VESICARE) 5 MG tablet Take 5 mg by mouth every morning.    Yes Historical Provider, MD  vitamin B-12 (CYANOCOBALAMIN) 1000 MCG tablet Take 1 tablet (1,000 mcg total) by mouth every morning. 01/22/14  Yes Donne Hazel, MD  vitamin E 100 UNIT capsule Take 100 Units by mouth every morning.   Yes Historical Provider, MD  atorvastatin (LIPITOR) 20 MG tablet Take 1 tablet (20 mg total) by mouth every morning. 01/22/14   Donne Hazel, MD  Emollient (EUCERIN) lotion Apply 5 mLs topically 2 (two) times daily as needed for dry skin.     Historical Provider, MD  furosemide (LASIX) 40 MG tablet Take 1 tablet (40 mg  total) by mouth 2 (two) times daily. 01/22/14   Donne Hazel, MD  pantoprazole (PROTONIX) 40 MG tablet Take 1 tablet (40 mg total) by mouth every morning. 01/22/14   Donne Hazel, MD  vitamin E 100 UNIT capsule Take 1 capsule (100 Units total) by mouth every morning. 01/22/14   Donne Hazel, MD   BP 155/101 mmHg  Pulse 89  Temp(Src) 98.8 F (37.1 C) (Oral)  Resp 16  SpO2 94%  LMP 03/24/2009 Physical Exam  Constitutional: She is oriented to person, place, and time.  Morbidly obese female, no  acute distress.  Cardiovascular: Normal rate, regular rhythm and intact distal pulses.   Heart tones are limited by body habitus, but no abnormal murmurs or rate noted  Pulmonary/Chest: Effort normal and breath sounds normal. No respiratory distress. She has no wheezes. She has no rales. She exhibits no tenderness.  Breath sounds are somewhat limited by body habitus, but no wheezes or rales noted  Abdominal: Soft. Bowel sounds are normal. She exhibits no distension and no mass. There is no tenderness. There is no rebound and no guarding.  Genitourinary:  No abscesses noted in the labial, vagina, mons pubis  Musculoskeletal: Normal range of motion. She exhibits no tenderness.  Neurological: She is alert and oriented to person, place, and time. She displays normal reflexes. She exhibits normal muscle tone. Coordination normal.  Skin: Skin is warm and dry. Rash noted. No erythema. No pallor.  Patient has diffuse maculopapular rash over arms pannus legs.  In some areas there is excoriation and scabbed over lesions.  She has 2 punched out ulcerations to the autumn part of her pannus the top part of her mons pubis.  There is no surrounding erythema or signs of cellulitis to these areas.  Patient has signs of hidradenitis in her axilla bilaterally.  She has a firm nodule without fluctuance or overriding erythema which may be a developing abscess but at this time does not appear amenable to drainage.   Nursing note and vitals reviewed.   ED Course  Procedures (including critical care time) Labs Review Labs Reviewed  CBC - Abnormal; Notable for the following:    WBC 12.0 (*)    All other components within normal limits  BASIC METABOLIC PANEL - Abnormal; Notable for the following:    Glucose, Bld 122 (*)    Creatinine, Ser 1.13 (*)    GFR calc non Af Amer 56 (*)    GFR calc Af Amer 65 (*)    All other components within normal limits  BRAIN NATRIURETIC PEPTIDE  TROPONIN I    Imaging Review Dg Chest 2 View  04/22/2014   CLINICAL DATA:  Body swelling, obesity, shortness of breath  EXAM: CHEST  2 VIEW  COMPARISON:  03/27/2014  FINDINGS: There is no focal parenchymal opacity, pleural effusion, or pneumothorax. There is stable cardiomegaly.  The osseous structures are unremarkable.  IMPRESSION: No active cardiopulmonary disease.   Electronically Signed   By: Kathreen Devoid   On: 04/22/2014 01:23     EKG Interpretation   Date/Time:  Saturday April 22 2014 00:29:25 EST Ventricular Rate:  80 PR Interval:  145 QRS Duration: 99 QT Interval:  385 QTC Calculation: 444 R Axis:   48 Text Interpretation:  Sinus rhythm Supraventricular bigeminy Low voltage,  precordial leads No significant change since last tracing Confirmed by  Lakeishia Truluck  MD, Joanne Salah ([id number]) on 04/22/2014 12:45:50 AM      MDM   Final diagnoses:  Chronic pruritic rash in adult  Hidradenitis axillaris  Shortness of breath    49 year old female with complaint of shortness of breath and fluid overload.  She does not appear to be acutely fluid overloaded.  There is no pulmonary edema, BNP is not elevated.  She has no pitting edema.  Patient reports the symptoms have been ongoing for several weeks, and I discussed with her the need to limit her calories and go on a diet to help with her whole body swelling.  Patient also strongly encouraged to follow-up with her primary care doctor in order to  work at getting a new sleep apnea  machine.  She does not appear to be in any significant respiratory distress at this time.  Patient has history of Darier's disease, but this appears stable at this time.  Kalman Drape, MD 04/22/14 [phone]

## 2014-04-22 NOTE — Discharge Instructions (Signed)
Hidradenitis Suppurativa, Sweat Gland Abscess Hidradenitis suppurativa is a long lasting (chronic), uncommon disease of the sweat glands. With this, boil-like lumps and scarring develop in the groin, some times under the arms (axillae), and under the breasts. It may also uncommonly occur behind the ears, in the crease of the buttocks, and around the genitals.  CAUSES  The cause is from a blocking of the sweat glands. They then become infected. It may cause drainage and odor. It is not contagious. So it cannot be given to someone else. It most often shows up in puberty (about 60 to 50 years of age). But it may happen much later. It is similar to acne which is a disease of the sweat glands. This condition is slightly more common in African-Americans and women. SYMPTOMS   Hidradenitis usually starts as one or more red, tender, swellings in the groin or under the arms (axilla).  Over a period of hours to days the lesions get larger. They often open to the skin surface, draining clear to yellow-colored fluid.  The infected area heals with scarring. DIAGNOSIS  Your caregiver makes this diagnosis by looking at you. Sometimes cultures (growing germs on plates in the lab) may be taken. This is to see what germ (bacterium) is causing the infection.  TREATMENT   Topical germ killing medicine applied to the skin (antibiotics) are the treatment of choice. Antibiotics taken by mouth (systemic) are sometimes needed when the condition is getting worse or is severe.  Avoid tight-fitting clothing which traps moisture in.  Dirt does not cause hidradenitis and it is not caused by poor hygiene.  Involved areas should be cleaned daily using an antibacterial soap. Some patients find that the liquid form of Lever 2000, applied to the involved areas as a lotion after bathing, can help reduce the odor related to this condition.  Sometimes surgery is needed to drain infected areas or remove scarred tissue. Removal of  large amounts of tissue is used only in severe cases.  Birth control pills may be helpful.  Oral retinoids (vitamin A derivatives) for 6 to 12 months which are effective for acne may also help this condition.  Weight loss will improve but not cure hidradenitis. It is made worse by being overweight. But the condition is not caused by being overweight.  This condition is more common in people who have had acne.  It may become worse under stress. There is no medical cure for hidradenitis. It can be controlled, but not cured. The condition usually continues for years with periods of getting worse and getting better (remission). Document Released: 10/23/2003 Document Revised: 06/02/2011 Document Reviewed: 06/10/2013 [facility] Patient Information 2015 [place], [place]. This information is not intended to replace advice given to you by your health care provider. Make sure you discuss any questions you have with your health care provider.  Shortness of Breath Shortness of breath means you have trouble breathing. It could also mean that you have a medical problem. You should get immediate medical care for shortness of breath. CAUSES   Not enough oxygen in the air such as with high altitudes or a smoke-filled room.  Certain lung diseases, infections, or problems.  Heart disease or conditions, such as angina or heart failure.  Low red blood cells (anemia).  Poor physical fitness, which can cause shortness of breath when you exercise.  Chest or back injuries or stiffness.  Being overweight.  Smoking.  Anxiety, which can make you feel like you are not getting enough  air. DIAGNOSIS  Serious medical problems can often be found during your physical exam. Tests may also be done to determine why you are having shortness of breath. Tests may include:  Chest X-rays.  Lung function tests.  Blood tests.  An electrocardiogram (ECG).  An ambulatory electrocardiogram. An ambulatory ECG records  your heartbeat patterns over a 24-hour period.  Exercise testing.  A transthoracic echocardiogram (TTE). During echocardiography, sound waves are used to evaluate how blood flows through your heart.  A transesophageal echocardiogram (TEE).  Imaging scans. Your health care provider may not be able to find a cause for your shortness of breath after your exam. In this case, it is important to have a follow-up exam with your health care provider as directed.  TREATMENT  Treatment for shortness of breath depends on the cause of your symptoms and can vary greatly. HOME CARE INSTRUCTIONS   Do not smoke. Smoking is a common cause of shortness of breath. If you smoke, ask for help to quit.  Avoid being around chemicals or things that may bother your breathing, such as paint fumes and dust.  Rest as needed. Slowly resume your usual activities.  If medicines were prescribed, take them as directed for the full length of time directed. This includes oxygen and any inhaled medicines.  Keep all follow-up appointments as directed by your health care provider. SEEK MEDICAL CARE IF:   Your condition does not improve in the time expected.  You have a hard time doing your normal activities even with rest.  You have any new symptoms. SEEK IMMEDIATE MEDICAL CARE IF:   Your shortness of breath gets worse.  You feel light-headed, faint, or develop a cough not controlled with medicines.  You start coughing up blood.  You have pain with breathing.  You have chest pain or pain in your arms, shoulders, or abdomen.  You have a fever.  You are unable to walk up stairs or exercise the way you normally do. MAKE SURE YOU:  Understand these instructions.  Will watch your condition.  Will get help right away if you are not doing well or get worse. Document Released: 12/03/2000 Document Revised: 03/15/2013 Document Reviewed: 05/26/2011 [facility] Patient Information 2015 [place], [place]. This  information is not intended to replace advice given to you by your health care provider. Make sure you discuss any questions you have with your health care provider.

## 2014-05-18 ENCOUNTER — Emergency Department (HOSPITAL_COMMUNITY): Payer: Medicaid Other

## 2014-05-18 ENCOUNTER — Encounter (HOSPITAL_COMMUNITY): Payer: Self-pay | Admitting: *Deleted

## 2014-05-18 ENCOUNTER — Emergency Department (HOSPITAL_COMMUNITY)
Admission: EM | Admit: 2014-05-18 | Discharge: 2014-05-18 | Disposition: A | Discharge status: Home / Self Care | Payer: Medicaid Other | Attending: Emergency Medicine | Admitting: Emergency Medicine

## 2014-05-18 DIAGNOSIS — Z79899 Other long term (current) drug therapy: Secondary | ICD-10-CM | POA: Insufficient documentation

## 2014-05-18 DIAGNOSIS — Z880 Allergy status to penicillin: Secondary | ICD-10-CM | POA: Diagnosis not present

## 2014-05-18 DIAGNOSIS — Z87891 Personal history of nicotine dependence: Secondary | ICD-10-CM | POA: Diagnosis not present

## 2014-05-18 DIAGNOSIS — I10 Essential (primary) hypertension: Secondary | ICD-10-CM | POA: Insufficient documentation

## 2014-05-18 DIAGNOSIS — L732 Hidradenitis suppurativa: Secondary | ICD-10-CM

## 2014-05-18 DIAGNOSIS — M1990 Unspecified osteoarthritis, unspecified site: Secondary | ICD-10-CM | POA: Insufficient documentation

## 2014-05-18 DIAGNOSIS — Z8614 Personal history of Methicillin resistant Staphylococcus aureus infection: Secondary | ICD-10-CM | POA: Diagnosis not present

## 2014-05-18 DIAGNOSIS — M109 Gout, unspecified: Secondary | ICD-10-CM | POA: Insufficient documentation

## 2014-05-18 DIAGNOSIS — Z590 Homelessness: Secondary | ICD-10-CM | POA: Insufficient documentation

## 2014-05-18 DIAGNOSIS — Z7982 Long term (current) use of aspirin: Secondary | ICD-10-CM | POA: Insufficient documentation

## 2014-05-18 DIAGNOSIS — K219 Gastro-esophageal reflux disease without esophagitis: Secondary | ICD-10-CM | POA: Insufficient documentation

## 2014-05-18 DIAGNOSIS — Z7952 Long term (current) use of systemic steroids: Secondary | ICD-10-CM | POA: Insufficient documentation

## 2014-05-18 DIAGNOSIS — E785 Hyperlipidemia, unspecified: Secondary | ICD-10-CM | POA: Insufficient documentation

## 2014-05-18 DIAGNOSIS — M7989 Other specified soft tissue disorders: Secondary | ICD-10-CM | POA: Diagnosis not present

## 2014-05-18 DIAGNOSIS — J45909 Unspecified asthma, uncomplicated: Secondary | ICD-10-CM | POA: Diagnosis not present

## 2014-05-18 DIAGNOSIS — Z791 Long term (current) use of non-steroidal anti-inflammatories (NSAID): Secondary | ICD-10-CM | POA: Diagnosis not present

## 2014-05-18 DIAGNOSIS — I509 Heart failure, unspecified: Secondary | ICD-10-CM | POA: Diagnosis not present

## 2014-05-18 DIAGNOSIS — Q288 Other specified congenital malformations of circulatory system: Secondary | ICD-10-CM | POA: Diagnosis not present

## 2014-05-18 DIAGNOSIS — R0602 Shortness of breath: Secondary | ICD-10-CM | POA: Diagnosis not present

## 2014-05-18 LAB — BASIC METABOLIC PANEL
ANION GAP: 8 (ref 5–15)
BUN: 19 mg/dL (ref 6–23)
CO2: 26 mmol/L (ref 19–32)
Calcium: 7.5 mg/dL — ABNORMAL LOW (ref 8.4–10.5)
Chloride: 107 mmol/L (ref 96–112)
Creatinine, Ser: 1.12 mg/dL — ABNORMAL HIGH (ref 0.50–1.10)
GFR calc non Af Amer: 56 mL/min — ABNORMAL LOW (ref >90)
GFR, EST AFRICAN AMERICAN: 65 mL/min — AB (ref >90)
Glucose, Bld: 126 mg/dL — ABNORMAL HIGH (ref 70–99)
Potassium: 3.6 mmol/L (ref 3.5–5.1)
Sodium: 141 mmol/L (ref 135–145)

## 2014-05-18 LAB — CBC WITH DIFFERENTIAL/PLATELET
BASOS ABS: 0 10*3/uL (ref 0.0–0.1)
Basophils Relative: 0 % (ref 0–1)
Eosinophils Absolute: 0.2 10*3/uL (ref 0.0–0.7)
Eosinophils Relative: 3 % (ref 0–5)
HCT: 40 % (ref 36.0–46.0)
HEMOGLOBIN: 11.8 g/dL — AB (ref 12.0–15.0)
LYMPHS ABS: 2.3 10*3/uL (ref 0.7–4.0)
Lymphocytes Relative: 28 % (ref 12–46)
MCH: 28.5 pg (ref 26.0–34.0)
MCHC: 29.5 g/dL — ABNORMAL LOW (ref 30.0–36.0)
MCV: 96.6 fL (ref 78.0–100.0)
MONOS PCT: 5 % (ref 3–12)
Monocytes Absolute: 0.4 10*3/uL (ref 0.1–1.0)
Neutro Abs: 5.4 10*3/uL (ref 1.7–7.7)
Neutrophils Relative %: 64 % (ref 43–77)
PLATELETS: 204 10*3/uL (ref 150–400)
RBC: 4.14 MIL/uL (ref 3.87–5.11)
RDW: 15 % (ref 11.5–15.5)
WBC: 8.4 10*3/uL (ref 4.0–10.5)

## 2014-05-18 LAB — BRAIN NATRIURETIC PEPTIDE: B Natriuretic Peptide: 14.6 pg/mL (ref 0.0–100.0)

## 2014-05-18 LAB — TROPONIN I: Troponin I: <0.03 ng/mL (ref <0.031)

## 2014-05-18 MED ORDER — HYDROCODONE-ACETAMINOPHEN 5-325 MG PO TABS
2.0000 | ORAL_TABLET | Freq: Once | ORAL | Status: AC
  Administered 2014-05-18: 2 via ORAL
  Filled 2014-05-18: qty 2

## 2014-05-18 MED ORDER — LIDOCAINE-EPINEPHRINE 1 %-1:100000 IJ SOLN
10.0000 mL | Freq: Once | INTRAMUSCULAR | Status: AC
  Administered 2014-05-18: 10 mL
  Filled 2014-05-18: qty 1

## 2014-05-18 MED ORDER — CLINDAMYCIN HCL 150 MG PO CAPS: 150.0000 mg | ORAL_CAPSULE | Freq: Three times a day (TID) | ORAL | Status: DC

## 2014-05-18 MED ORDER — HYDROCODONE-ACETAMINOPHEN 5-325 MG PO TABS: 2.0000 | ORAL_TABLET | ORAL | Status: DC | PRN

## 2014-05-18 MED ORDER — CLINDAMYCIN HCL 150 MG PO CAPS
150.0000 mg | ORAL_CAPSULE | Freq: Once | ORAL | Status: AC
  Administered 2014-05-18: 150 mg via ORAL
  Filled 2014-05-18: qty 1

## 2014-05-18 NOTE — ED Notes (Addendum)
EMS states they were called out due to pt having boils "the size of golf balls" in her genital area. Upon EMS arrival, pt appeared short of breath. Pt states she has had cough and shortness of breath for 3 days. Pt given 10mg  albuterol/0.5mg  atrovent neb treatment, 125 mg solumedrol. Pt was 96% on RA upon EMS arrival. Pt has hx of CHF and asthma.

## 2014-05-18 NOTE — Discharge Instructions (Signed)
Hidradenitis Suppurativa, Sweat Gland Abscess Hidradenitis suppurativa is a long lasting (chronic), uncommon disease of the sweat glands. With this, boil-like lumps and scarring develop in the groin, some times under the arms (axillae), and under the breasts. It may also uncommonly occur behind the ears, in the crease of the buttocks, and around the genitals.  CAUSES  The cause is from a blocking of the sweat glands. They then become infected. It may cause drainage and odor. It is not contagious. So it cannot be given to someone else. It most often shows up in puberty (about 10 to 50 years of age). But it may happen much later. It is similar to acne which is a disease of the sweat glands. This condition is slightly more common in African-Americans and women. SYMPTOMS   Hidradenitis usually starts as one or more red, tender, swellings in the groin or under the arms (axilla).  Over a period of hours to days the lesions get larger. They often open to the skin surface, draining clear to yellow-colored fluid.  The infected area heals with scarring. DIAGNOSIS  Your caregiver makes this diagnosis by looking at you. Sometimes cultures (growing germs on plates in the lab) may be taken. This is to see what germ (bacterium) is causing the infection.  TREATMENT   Topical germ killing medicine applied to the skin (antibiotics) are the treatment of choice. Antibiotics taken by mouth (systemic) are sometimes needed when the condition is getting worse or is severe.  Avoid tight-fitting clothing which traps moisture in.  Dirt does not cause hidradenitis and it is not caused by poor hygiene.  Involved areas should be cleaned daily using an antibacterial soap. Some patients find that the liquid form of Lever 2000, applied to the involved areas as a lotion after bathing, can help reduce the odor related to this condition.  Sometimes surgery is needed to drain infected areas or remove scarred tissue. Removal of  large amounts of tissue is used only in severe cases.  Birth control pills may be helpful.  Oral retinoids (vitamin A derivatives) for 6 to 12 months which are effective for acne may also help this condition.  Weight loss will improve but not cure hidradenitis. It is made worse by being overweight. But the condition is not caused by being overweight.  This condition is more common in people who have had acne.  It may become worse under stress. There is no medical cure for hidradenitis. It can be controlled, but not cured. The condition usually continues for years with periods of getting worse and getting better (remission). Document Released: 10/23/2003 Document Revised: 06/02/2011 Document Reviewed: 06/10/2013 [employer] Patient Information 2015 [employer]. This information is not intended to replace advice given to you by your health care provider. Make sure you discuss any questions you have with your health care provider.  

## 2014-05-18 NOTE — ED Notes (Signed)
Ptar picking up patient

## 2014-05-18 NOTE — ED Notes (Signed)
Bed: WA03 Expected date:  Expected time:  Means of arrival:  Comments: EMS 

## 2014-05-18 NOTE — ED Provider Notes (Signed)
CSN: [id number]     Arrival date & time 05/18/14  1502 History   First MD Initiated Contact with Patient 05/18/14 1504     Chief Complaint  Patient presents with  . Shortness of Breath  . Recurrent Skin Infections      HPI  Patient presents for evaluation of  #1)   "I think I'm filling up with fluid".   #2 "I have small size things down by my groin".  History of CHF. Denies shortness of breath. She is compliant with her Lasix. Has had history of hidradenitis. Feels like she has an infection "down by my groin". Given a nebulizer treatment en route.  Past Medical History  Diagnosis Date  . Asthma   . GERD (gastroesophageal reflux disease)   . Hypertension   . Homelessness   . Low back pain   . 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 Arthritis   . Gout   . Hyperlipemia   . Morbid obesity     uses motor wheel chair  . MRSA (methicillin resistant Staphylococcus aureus)     states about a year ago  . CHF (congestive heart failure)    Past Surgical History  Procedure Laterality Date  . Tubal ligation    . Cesarean section     Family History  Problem Relation Age of Onset  . Asthma Mother   . Diabetes Father   . Heart disease Father   . Stroke Father    History  Substance Use Topics  . Smoking status: Former Smoker    Types: Cigarettes    Quit date: 03/25/2003  . Smokeless tobacco: Former Systems developer    Quit date: 05/27/1978  . Alcohol Use: No   OB History    No data available     Review of Systems  Constitutional: Negative for fever, chills, diaphoresis, appetite change and fatigue.  HENT: Negative for mouth sores, sore throat and trouble swallowing.   Eyes: Negative for visual disturbance.  Respiratory: Negative for cough, chest tightness, shortness of breath and wheezing.   Cardiovascular: Positive for leg swelling. Negative for chest pain.  Gastrointestinal: Negative for nausea, vomiting, abdominal pain, diarrhea and abdominal distention.  Endocrine:  Negative for polydipsia, polyphagia and polyuria.  Genitourinary: Negative for dysuria, frequency and hematuria.  Musculoskeletal: Negative for gait problem.  Skin: Negative for color change, pallor and rash.       Things down by my groin  Neurological: Negative for dizziness, syncope, light-headedness and headaches.  Hematological: Does not bruise/bleed easily.  Psychiatric/Behavioral: Negative for behavioral problems and confusion.      Allergies  Ibuprofen; Adhesive; Azithromycin; Doxycycline; Penicillins; Sulfa antibiotics; and Sulfamethoxazole-trimethoprim  Home Medications   Prior to Admission medications   Medication Sig Start Date End Date Taking? Authorizing Provider  albuterol (PROVENTIL HFA) 108 (90 BASE) MCG/ACT inhaler Inhale 2 puffs into the lungs every 4 (four) hours as needed for wheezing or shortness of breath. 01/22/14  Yes Donne Hazel, MD  albuterol (PROVENTIL) (2.5 MG/3ML) 0.083% nebulizer solution Take 2.5 mg by nebulization every 6 (six) hours as needed for wheezing or shortness of breath (wheezing).    Yes Historical Provider, MD  allopurinol (ZYLOPRIM) 100 MG tablet Take 1 tablet (100 mg total) by mouth every morning. 01/22/14  Yes Donne Hazel, MD  ALPRAZolam Duanne Moron) 1 MG tablet Take 1 mg by mouth at bedtime as needed for anxiety (anxiety).   Yes Historical Provider, MD  ammonium lactate (AMLACTIN)  12 % cream Apply 1 g topically daily as needed for dry skin. 04/22/14  Yes Kalman Drape, MD  aspirin 325 MG tablet Take 325 mg by mouth every morning.    Yes Historical Provider, MD  atorvastatin (LIPITOR) 20 MG tablet Take 1 tablet (20 mg total) by mouth every morning. 01/22/14  Yes Donne Hazel, MD  diphenhydrAMINE (BENADRYL) 25 mg capsule Take 25 mg by mouth every 6 (six) hours as needed for itching or allergies (allergies).    Yes Historical Provider, MD  Emollient (EUCERIN) lotion Apply 5 mLs topically 2 (two) times daily as needed for dry skin. 04/22/14  Yes Kalman Drape, MD  folic acid (FOLVITE) 1 MG tablet Take 1 tablet (1 mg total) by mouth daily. 01/22/14  Yes Donne Hazel, MD  furosemide (LASIX) 40 MG tablet Take 1 tablet (40 mg total) by mouth 2 (two) times daily. 01/22/14  Yes Donne Hazel, MD  guaiFENesin (MUCINEX) 600 MG 12 hr tablet Take 600 mg by mouth 2 (two) times daily.   Yes Historical Provider, MD  hydrOXYzine (ATARAX/VISTARIL) 25 MG tablet Take 25 mg by mouth 3 (three) times daily as needed for anxiety or itching (anxiety and itching).    Yes Historical Provider, MD  ketoconazole (NIZORAL) 2 % shampoo Apply 1 application topically 2 (two) times a week.   Yes Historical Provider, MD  ketorolac (TORADOL) 10 MG tablet Take 1 tablet (10 mg total) by mouth every 6 (six) hours as needed for severe pain. 01/23/14  Yes Donne Hazel, MD  pantoprazole (PROTONIX) 40 MG tablet Take 1 tablet (40 mg total) by mouth every morning. 01/22/14  Yes Donne Hazel, MD  solifenacin (VESICARE) 5 MG tablet Take 5 mg by mouth every morning.    Yes Historical Provider, MD  vitamin E 100 UNIT capsule Take 1 capsule (100 Units total) by mouth every morning. 01/22/14  Yes Donne Hazel, MD  clindamycin (CLEOCIN) 150 MG capsule Take 1 capsule (150 mg total) by mouth 3 (three) times daily. 05/18/14   Tanna Furry, MD  ketoconazole (NIZORAL) 200 MG tablet Take 200 mg by mouth every morning.     Historical Provider, MD  Multiple Vitamin (MULTIVITAMIN WITH MINERALS) TABS tablet Take 1 tablet by mouth daily. Patient not taking: Reported on 05/18/2014 01/22/14   Donne Hazel, MD  predniSONE (DELTASONE) 10 MG tablet Take 4 tablets (40 mg total) by mouth daily. Patient not taking: Reported on 05/18/2014 03/28/14   Tatyana A Kirichenko, PA-C  vitamin B-12 (CYANOCOBALAMIN) 1000 MCG tablet Take 1 tablet (1,000 mcg total) by mouth every morning. Patient not taking: Reported on 05/18/2014 01/22/14   Donne Hazel, MD   BP 139/68 mmHg  Pulse 72  Resp 20  SpO2 96%  LMP  03/24/2009 Physical Exam  Constitutional: She is oriented to person, place, and time. She appears well-developed and well-nourished. No distress.  Morbid obesity. No acute distress.  HENT:  Head: Normocephalic.  Eyes: Conjunctivae are normal. Pupils are equal, round, and reactive to light. No scleral icterus.  Neck: Normal range of motion. Neck supple. No thyromegaly present.  Cardiovascular: Normal rate and regular rhythm.  Exam reveals no gallop and no friction rub.   No murmur heard. No S3 or 4 gallop. No crackles. She  Pulmonary/Chest: Effort normal and breath sounds normal. No respiratory distress. She has no wheezes. She has no rales.  Abdominal: Soft. Bowel sounds are normal. She exhibits no distension. There is  no tenderness. There is no rebound.  Musculoskeletal: Normal range of motion.  Neurological: She is alert and oriented to person, place, and time.  Skin: Skin is warm and dry. No rash noted.     No dependent edema.  Psychiatric: She has a normal mood and affect. Her behavior is normal.    ED Course  Procedures (including critical care time) Labs Review Labs Reviewed  CBC WITH DIFFERENTIAL/PLATELET - Abnormal; Notable for the following:    Hemoglobin 11.8 (*)    MCHC 29.5 (*)    All other components within normal limits  BASIC METABOLIC PANEL - Abnormal; Notable for the following:    Glucose, Bld 126 (*)    Creatinine, Ser 1.12 (*)    Calcium 7.5 (*)    GFR calc non Af Amer 56 (*)    GFR calc Af Amer 65 (*)    All other components within normal limits  BRAIN NATRIURETIC PEPTIDE  TROPONIN I    Imaging Review Dg Chest 2 View  05/18/2014   CLINICAL DATA:  Shortness of breath. History of asthma. Hypertension. CHF. Ex-smoker. Morbid obesity.  EXAM: CHEST  2 VIEW  COMPARISON:  04/22/2014  FINDINGS: Mildly degraded exam due to portable technique and patient body habitus. Midline trachea. Cardiomegaly accentuated by AP portable technique. Lateral view severely  degraded by patient size and overlying arms.  No definite pleural fluid. No pneumothorax. Low lung volumes with resultant pulmonary interstitial prominence. Mild blunting of the right cardiophrenic angle.  IMPRESSION: Cardiomegaly and low lung volumes.  No congestive failure.  Blunting of the right cardiophrenic angle, favored to be due to volume loss and atelectasis. If patient's symptoms warrant, consider radiographic followup in 2-3 days to exclude developing pneumonia.   Electronically Signed   By: Abigail Miyamoto M.D.   On: 05/18/2014 16:49     EKG Interpretation None       INCISION AND DRAINAGE Performed by: Lolita Patella Consent: Verbal consent obtained. Risks and benefits: risks, benefits and alternatives were discussed Type: abscess  Body area: Mons pubis  Anesthesia: local infiltration  Incision was made with a scalpel.  Local anesthetic: lidocaine 2% with epinephrine  Anesthetic total: 4 ml  Complexity: complex Blunt dissection to break up loculations  Drainage: purulent  Drainage amount: 3  Packing material: 1/4 in iodoform gauze  Patient tolerance: Patient tolerated the procedure well with no immediate complications.    MDM   Final diagnoses:  SOB (shortness of breath)  Hydradenitis    Patient clinically and radiographically, and per lab does not appear fluid overloaded. She is appropriate to be discharged home. Remove the gauze packing and begin soaks in the next 48 hours. Pressures and for clindamycin.    Tanna Furry, MD 05/19/14 [phone]

## 2014-05-18 NOTE — ED Notes (Signed)
Patient waiting on PTAR. 

## 2014-06-13 ENCOUNTER — Emergency Department (HOSPITAL_COMMUNITY): Payer: Medicaid Other

## 2014-06-13 ENCOUNTER — Emergency Department (HOSPITAL_COMMUNITY)
Admission: EM | Admit: 2014-06-13 | Discharge: 2014-06-13 | Disposition: A | Discharge status: Home / Self Care | Payer: Medicaid Other | Attending: Emergency Medicine | Admitting: Emergency Medicine

## 2014-06-13 ENCOUNTER — Encounter (HOSPITAL_COMMUNITY): Payer: Self-pay | Admitting: Emergency Medicine

## 2014-06-13 DIAGNOSIS — M1990 Unspecified osteoarthritis, unspecified site: Secondary | ICD-10-CM | POA: Diagnosis not present

## 2014-06-13 DIAGNOSIS — M109 Gout, unspecified: Secondary | ICD-10-CM | POA: Diagnosis not present

## 2014-06-13 DIAGNOSIS — I10 Essential (primary) hypertension: Secondary | ICD-10-CM | POA: Insufficient documentation

## 2014-06-13 DIAGNOSIS — R2240 Localized swelling, mass and lump, unspecified lower limb: Secondary | ICD-10-CM | POA: Diagnosis not present

## 2014-06-13 DIAGNOSIS — J45901 Unspecified asthma with (acute) exacerbation: Secondary | ICD-10-CM

## 2014-06-13 DIAGNOSIS — I509 Heart failure, unspecified: Secondary | ICD-10-CM | POA: Insufficient documentation

## 2014-06-13 DIAGNOSIS — Z590 Homelessness: Secondary | ICD-10-CM | POA: Insufficient documentation

## 2014-06-13 DIAGNOSIS — Z79899 Other long term (current) drug therapy: Secondary | ICD-10-CM | POA: Insufficient documentation

## 2014-06-13 DIAGNOSIS — R51 Headache: Secondary | ICD-10-CM | POA: Diagnosis not present

## 2014-06-13 DIAGNOSIS — Z880 Allergy status to penicillin: Secondary | ICD-10-CM | POA: Diagnosis not present

## 2014-06-13 DIAGNOSIS — E785 Hyperlipidemia, unspecified: Secondary | ICD-10-CM | POA: Insufficient documentation

## 2014-06-13 DIAGNOSIS — R0789 Other chest pain: Secondary | ICD-10-CM | POA: Diagnosis not present

## 2014-06-13 DIAGNOSIS — M791 Myalgia: Secondary | ICD-10-CM | POA: Diagnosis not present

## 2014-06-13 DIAGNOSIS — R05 Cough: Secondary | ICD-10-CM | POA: Diagnosis present

## 2014-06-13 DIAGNOSIS — Q822 Mastocytosis: Secondary | ICD-10-CM | POA: Insufficient documentation

## 2014-06-13 DIAGNOSIS — R21 Rash and other nonspecific skin eruption: Secondary | ICD-10-CM | POA: Insufficient documentation

## 2014-06-13 DIAGNOSIS — Z792 Long term (current) use of antibiotics: Secondary | ICD-10-CM | POA: Diagnosis not present

## 2014-06-13 DIAGNOSIS — Z87891 Personal history of nicotine dependence: Secondary | ICD-10-CM | POA: Diagnosis not present

## 2014-06-13 DIAGNOSIS — N390 Urinary tract infection, site not specified: Secondary | ICD-10-CM

## 2014-06-13 DIAGNOSIS — Z8619 Personal history of other infectious and parasitic diseases: Secondary | ICD-10-CM | POA: Insufficient documentation

## 2014-06-13 DIAGNOSIS — K219 Gastro-esophageal reflux disease without esophagitis: Secondary | ICD-10-CM | POA: Diagnosis not present

## 2014-06-13 DIAGNOSIS — Z7982 Long term (current) use of aspirin: Secondary | ICD-10-CM | POA: Insufficient documentation

## 2014-06-13 LAB — CBC WITH DIFFERENTIAL/PLATELET
BASOS ABS: 0 10*3/uL (ref 0.0–0.1)
Basophils Relative: 0 % (ref 0–1)
EOS PCT: 1 % (ref 0–5)
Eosinophils Absolute: 0.2 10*3/uL (ref 0.0–0.7)
HEMATOCRIT: 43 % (ref 36.0–46.0)
Hemoglobin: 12.8 g/dL (ref 12.0–15.0)
LYMPHS ABS: 1.4 10*3/uL (ref 0.7–4.0)
Lymphocytes Relative: 12 % (ref 12–46)
MCH: 28.1 pg (ref 26.0–34.0)
MCHC: 29.8 g/dL — ABNORMAL LOW (ref 30.0–36.0)
MCV: 94.3 fL (ref 78.0–100.0)
MONO ABS: 0.9 10*3/uL (ref 0.1–1.0)
Monocytes Relative: 8 % (ref 3–12)
Neutro Abs: 9.4 10*3/uL — ABNORMAL HIGH (ref 1.7–7.7)
Neutrophils Relative %: 79 % — ABNORMAL HIGH (ref 43–77)
PLATELETS: 273 10*3/uL (ref 150–400)
RBC: 4.56 MIL/uL (ref 3.87–5.11)
RDW: 14.7 % (ref 11.5–15.5)
WBC: 11.9 10*3/uL — ABNORMAL HIGH (ref 4.0–10.5)

## 2014-06-13 LAB — COMPREHENSIVE METABOLIC PANEL
ALBUMIN: 3.4 g/dL — AB (ref 3.5–5.2)
ALK PHOS: 61 U/L (ref 39–117)
ALT: 11 U/L (ref 0–35)
AST: 13 U/L (ref 0–37)
Anion gap: 11 (ref 5–15)
BILIRUBIN TOTAL: 0.6 mg/dL (ref 0.3–1.2)
BUN: 18 mg/dL (ref 6–23)
CHLORIDE: 101 mmol/L (ref 96–112)
CO2: 29 mmol/L (ref 19–32)
Calcium: 8.4 mg/dL (ref 8.4–10.5)
Creatinine, Ser: 1.43 mg/dL — ABNORMAL HIGH (ref 0.50–1.10)
GFR calc Af Amer: 49 mL/min — ABNORMAL LOW (ref >90)
GFR calc non Af Amer: 42 mL/min — ABNORMAL LOW (ref >90)
Glucose, Bld: 136 mg/dL — ABNORMAL HIGH (ref 70–99)
POTASSIUM: 4.1 mmol/L (ref 3.5–5.1)
SODIUM: 141 mmol/L (ref 135–145)
TOTAL PROTEIN: 6.9 g/dL (ref 6.0–8.3)

## 2014-06-13 LAB — URINALYSIS, ROUTINE W REFLEX MICROSCOPIC
Bilirubin Urine: NEGATIVE
GLUCOSE, UA: NEGATIVE mg/dL
KETONES UR: NEGATIVE mg/dL
Nitrite: POSITIVE — AB
PROTEIN: NEGATIVE mg/dL
SPECIFIC GRAVITY, URINE: 1.017 (ref 1.005–1.030)
UROBILINOGEN UA: 0.2 mg/dL (ref 0.0–1.0)
pH: 5.5 (ref 5.0–8.0)

## 2014-06-13 LAB — I-STAT TROPONIN, ED: TROPONIN I, POC: 0 ng/mL (ref 0.00–0.08)

## 2014-06-13 LAB — URINE MICROSCOPIC-ADD ON

## 2014-06-13 LAB — I-STAT CG4 LACTIC ACID, ED
LACTIC ACID, VENOUS: 1.25 mmol/L (ref 0.5–2.0)
Lactic Acid, Venous: 1.04 mmol/L (ref 0.5–2.0)

## 2014-06-13 LAB — BRAIN NATRIURETIC PEPTIDE: B Natriuretic Peptide: 42.7 pg/mL (ref 0.0–100.0)

## 2014-06-13 MED ORDER — ACETAMINOPHEN 325 MG PO TABS
650.0000 mg | ORAL_TABLET | Freq: Once | ORAL | Status: AC
  Administered 2014-06-13: 650 mg via ORAL
  Filled 2014-06-13: qty 2

## 2014-06-13 MED ORDER — ALBUTEROL SULFATE (2.5 MG/3ML) 0.083% IN NEBU
5.0000 mg | INHALATION_SOLUTION | Freq: Once | RESPIRATORY_TRACT | Status: AC
  Administered 2014-06-13: 5 mg via RESPIRATORY_TRACT
  Filled 2014-06-13: qty 6

## 2014-06-13 MED ORDER — CEPHALEXIN 500 MG PO CAPS: 500.0000 mg | ORAL_CAPSULE | Freq: Four times a day (QID) | ORAL | Status: DC

## 2014-06-13 MED ORDER — PREDNISONE 10 MG PO TABS: ORAL_TABLET | ORAL | Status: DC

## 2014-06-13 MED ORDER — METHYLPREDNISOLONE SODIUM SUCC 125 MG IJ SOLR
125.0000 mg | Freq: Once | INTRAMUSCULAR | Status: AC
  Administered 2014-06-13: 125 mg via INTRAVENOUS
  Filled 2014-06-13: qty 2

## 2014-06-13 MED ORDER — IPRATROPIUM BROMIDE 0.02 % IN SOLN
0.5000 mg | Freq: Once | RESPIRATORY_TRACT | Status: AC
  Administered 2014-06-13: 0.5 mg via RESPIRATORY_TRACT
  Filled 2014-06-13: qty 2.5

## 2014-06-13 MED ORDER — DEXTROSE 5 % IV SOLN
1.0000 g | Freq: Once | INTRAVENOUS | Status: AC
  Administered 2014-06-13: 1 g via INTRAVENOUS
  Filled 2014-06-13: qty 10

## 2014-06-13 NOTE — ED Notes (Signed)
Breakfast tray ordered 

## 2014-06-13 NOTE — ED Provider Notes (Signed)
CSN: [id number]     Arrival date & time 06/13/14  0547 History   First MD Initiated Contact with Patient 06/13/14 0601     Chief Complaint  Patient presents with  . Cough  . Asthma  . Generalized Body Aches     (Consider location/radiation/quality/duration/timing/severity/associated sxs/prior Treatment) HPI Kara Vincent is a 49 y.o. female with history of asthma, Darier disease, morbid obesity, CHF, presents to emergency department complaining of body aches, cough, wheezing, "kidney problem." Patient states she has had cough and wheezing for about a week. She was seen by her doctor who told her to continue to do her nebulizer treatments 3 times a day which she has been doing. She states they're not helping. Patient is also complaining of possible fluid overload. States "I feel like fluid all over me in my doctor told me that my kidneys are not working." She said she was seen by her doctor several days ago and was told that her kidney tests were elevated and she was instructed to stop Lasix. She states she has not had Lasix in about 4-5 days. Patient denies any other URI symptoms. She denies any urinary symptoms. She states she is having chest pain, denies any abdominal pain. Unsure if she's been running a fever. No medications taken prior to arrival except for her breathing treatments morning.  Past Medical History  Diagnosis Date  . Asthma   . GERD (gastroesophageal reflux disease)   . Hypertension   . Homelessness   . Low back pain   . 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 Arthritis   . Gout   . Hyperlipemia   . Morbid obesity     uses motor wheel chair  . MRSA (methicillin resistant Staphylococcus aureus)     states about a year ago  . CHF (congestive heart failure)    Past Surgical History  Procedure Laterality Date  . Tubal ligation    . Cesarean section     Family History  Problem Relation Age of Onset  . Asthma Mother   . Diabetes Father   . Heart  disease Father   . Stroke Father    History  Substance Use Topics  . Smoking status: Former Smoker    Types: Cigarettes    Quit date: 03/25/2003  . Smokeless tobacco: Former Systems developer    Quit date: 05/27/1978  . Alcohol Use: No   OB History    No data available     Review of Systems  Constitutional: Positive for fever and chills.  HENT: Negative for congestion and sore throat.   Respiratory: Positive for cough, chest tightness, shortness of breath and wheezing.   Cardiovascular: Positive for chest pain and leg swelling. Negative for palpitations.  Gastrointestinal: Negative for nausea, vomiting, abdominal pain and diarrhea.  Genitourinary: Negative for dysuria, flank pain and pelvic pain.  Musculoskeletal: Positive for myalgias and arthralgias. Negative for neck pain and neck stiffness.  Skin: Positive for rash.  Neurological: Positive for headaches. Negative for dizziness and weakness.  All other systems reviewed and are negative.     Allergies  Ibuprofen; Adhesive; Azithromycin; Doxycycline; Penicillins; Sulfa antibiotics; and Sulfamethoxazole-trimethoprim  Home Medications   Prior to Admission medications   Medication Sig Start Date End Date Taking? Authorizing Provider  albuterol (PROVENTIL HFA) 108 (90 BASE) MCG/ACT inhaler Inhale 2 puffs into the lungs every 4 (four) hours as needed for wheezing or shortness of breath. 01/22/14   Donne Hazel,  MD  albuterol (PROVENTIL) (2.5 MG/3ML) 0.083% nebulizer solution Take 2.5 mg by nebulization every 6 (six) hours as needed for wheezing or shortness of breath (wheezing).     Historical Provider, MD  allopurinol (ZYLOPRIM) 100 MG tablet Take 1 tablet (100 mg total) by mouth every morning. 01/22/14   Donne Hazel, MD  ALPRAZolam Duanne Moron) 1 MG tablet Take 1 mg by mouth at bedtime as needed for anxiety (anxiety).    Historical Provider, MD  ammonium lactate (AMLACTIN) 12 % cream Apply 1 g topically daily as needed for dry skin. 04/22/14    Linton Flemings, MD  aspirin 325 MG tablet Take 325 mg by mouth every morning.     Historical Provider, MD  atorvastatin (LIPITOR) 20 MG tablet Take 1 tablet (20 mg total) by mouth every morning. 01/22/14   Donne Hazel, MD  clindamycin (CLEOCIN) 150 MG capsule Take 1 capsule (150 mg total) by mouth 3 (three) times daily. 05/18/14   Tanna Furry, MD  diphenhydrAMINE (BENADRYL) 25 mg capsule Take 25 mg by mouth every 6 (six) hours as needed for itching or allergies (allergies).     Historical Provider, MD  Emollient (EUCERIN) lotion Apply 5 mLs topically 2 (two) times daily as needed for dry skin. 04/22/14   Linton Flemings, MD  folic acid (FOLVITE) 1 MG tablet Take 1 tablet (1 mg total) by mouth daily. 01/22/14   Donne Hazel, MD  furosemide (LASIX) 40 MG tablet Take 1 tablet (40 mg total) by mouth 2 (two) times daily. 01/22/14   Donne Hazel, MD  guaiFENesin (MUCINEX) 600 MG 12 hr tablet Take 600 mg by mouth 2 (two) times daily.    Historical Provider, MD  HYDROcodone-acetaminophen (NORCO/VICODIN) 5-325 MG per tablet Take 2 tablets by mouth every 4 (four) hours as needed. 05/18/14   Tanna Furry, MD  hydrOXYzine (ATARAX/VISTARIL) 25 MG tablet Take 25 mg by mouth 3 (three) times daily as needed for anxiety or itching (anxiety and itching).     Historical Provider, MD  ketoconazole (NIZORAL) 2 % shampoo Apply 1 application topically 2 (two) times a week.    Historical Provider, MD  ketoconazole (NIZORAL) 200 MG tablet Take 200 mg by mouth every morning.     Historical Provider, MD  ketorolac (TORADOL) 10 MG tablet Take 1 tablet (10 mg total) by mouth every 6 (six) hours as needed for severe pain. 01/23/14   Donne Hazel, MD  Multiple Vitamin (MULTIVITAMIN WITH MINERALS) TABS tablet Take 1 tablet by mouth daily. Patient not taking: Reported on 05/18/2014 01/22/14   Donne Hazel, MD  pantoprazole (PROTONIX) 40 MG tablet Take 1 tablet (40 mg total) by mouth every morning. 01/22/14   Donne Hazel, MD  predniSONE  (DELTASONE) 10 MG tablet Take 4 tablets (40 mg total) by mouth daily. Patient not taking: Reported on 05/18/2014 03/28/14   Jeannett Senior, PA-C  solifenacin (VESICARE) 5 MG tablet Take 5 mg by mouth every morning.     Historical Provider, MD  vitamin B-12 (CYANOCOBALAMIN) 1000 MCG tablet Take 1 tablet (1,000 mcg total) by mouth every morning. Patient not taking: Reported on 05/18/2014 01/22/14   Donne Hazel, MD  vitamin E 100 UNIT capsule Take 1 capsule (100 Units total) by mouth every morning. 01/22/14   Donne Hazel, MD   BP 146/69 mmHg  Pulse 90  Temp(Src) 101.3 F (38.5 C) (Oral)  Resp 27  Ht 5\' 5"  (1.651 m)  Wt 350 lb (158.759  kg)  BMI 58.24 kg/m2  SpO2 98%  LMP 03/24/2009 Physical Exam  Constitutional: She is oriented to person, place, and time. She appears well-developed and well-nourished. No distress.  Morbidly obese  HENT:  Head: Normocephalic.  Right Ear: External ear normal.  Left Ear: External ear normal.  Nose: Nose normal.  Mouth/Throat: Oropharynx is clear and moist.  Eyes: Conjunctivae are normal.  Neck: Neck supple.  Cardiovascular: Normal rate, regular rhythm and normal heart sounds.   Pulmonary/Chest: Effort normal. No respiratory distress. She has wheezes. She has no rales.  Inspiratory and expiratory wheezes bilaterally  Abdominal: Soft. Bowel sounds are normal. She exhibits no distension. There is no tenderness. There is no rebound.  Musculoskeletal:  Non pitting edema of bilateral LE and UE, although difficult to tell if this is from her body habitus  Neurological: She is alert and oriented to person, place, and time.  Skin: Skin is warm and dry. Rash noted.  Psychiatric: She has a normal mood and affect. Her behavior is normal.  Nursing note and vitals reviewed.   ED Course  Procedures (including critical care time) Labs Review Labs Reviewed  CBC WITH DIFFERENTIAL/PLATELET - Abnormal; Notable for the following:    WBC 11.9 (*)    MCHC 29.8  (*)    Neutrophils Relative % 79 (*)    Neutro Abs 9.4 (*)    All other components within normal limits  COMPREHENSIVE METABOLIC PANEL - Abnormal; Notable for the following:    Glucose, Bld 136 (*)    Creatinine, Ser 1.43 (*)    Albumin 3.4 (*)    GFR calc non Af Amer 42 (*)    GFR calc Af Amer 49 (*)    All other components within normal limits  URINALYSIS, ROUTINE W REFLEX MICROSCOPIC - Abnormal; Notable for the following:    APPearance HAZY (*)    Hgb urine dipstick TRACE (*)    Nitrite POSITIVE (*)    Leukocytes, UA MODERATE (*)    All other components within normal limits  URINE MICROSCOPIC-ADD ON - Abnormal; Notable for the following:    Squamous Epithelial / LPF MANY (*)    Bacteria, UA MANY (*)    All other components within normal limits  URINE CULTURE  BRAIN NATRIURETIC PEPTIDE  I-STAT CG4 LACTIC ACID, ED  I-STAT TROPOININ, ED  I-STAT CG4 LACTIC ACID, ED    Imaging Review Dg Chest 2 View  06/13/2014   CLINICAL DATA:  Productive cough  EXAM: CHEST  2 VIEW  COMPARISON:  05/18/2014  FINDINGS: There is cardiomegaly, unchanged. Mild vascular prominence is also unchanged. No large effusion is evident. No dense airspace consolidation is evident.  IMPRESSION: Cardiomegaly and vascular prominence, possibly chronic. No dense consolidation or large effusion.   Electronically Signed   By: Andreas Newport M.D.   On: 06/13/2014 06:55     EKG Interpretation   Date/Time:  Tuesday June 13 2014 [id number] EDT Ventricular Rate:  91 PR Interval:  136 QRS Duration: 102 QT Interval:  353 QTC Calculation: 434 R Axis:   48 Text Interpretation:  Sinus rhythm Atrial premature complexes Low voltage,  precordial leads No significant change was found Confirmed by [facility]  MD,  TREY ([id number]) on 06/13/2014 9:12:26 AM      MDM   Final diagnoses:  Asthma exacerbation  UTI (lower urinary tract infection)   Pt is here with cough, wheezing, SOB, swelling, body aches. She is febrile at  101.3. Will start with a fever work  up: Labs, CXR, UA, lactic acid. Will add BNP, pt does have hx of CHF. ecg and trop due to CP. Pt's VS normal otherwise.   10:07 AM Labs showing elevated WBC at 11.9. Creat 1.43, similar in the past. Pt's CXR unremarkable, however, I did look at it, poor quality, mostly due to pt's body habitus. UA infected. Cultures sent. Given rocephin 1g IV in ED. Home with keflex and prednisone, continue neb treatments. Pt's VS here are normal including normal oxygen sat above 96% on her regular 2L of oxygen. Pt does not ambulate at baseline. She is requesting admission, but at this point no indication for inpatient treatment. Pt will need close follow up with PCP, also to find out if she needs to be restarted on lasix. Pt voiced understanding.   I asked her prior to discharge if she needs help with living situation given hx of homelessness. Pt stated she has a home right now and lives with a friend. Also stated that she still has oxygen at home and will be able to fill her prescriptions.   Filed Vitals:   06/13/14 0915 06/13/14 0930 06/13/14 0945 06/13/14 0957  BP: 118/64 116/94 111/54   Pulse: 93 90 92   Temp:      TempSrc:      Resp: 26 25 15    Height:      Weight:      SpO2: 95% 96% 96% 96%     Jeannett Senior, PA-C 06/13/14 1524  Julianne Rice, MD 06/14/14 [phone]

## 2014-06-13 NOTE — ED Notes (Signed)
Pt c/o wheezing/cough/body aches/generalized edema and "kidneys not functioning" x 7 days.

## 2014-06-13 NOTE — ED Notes (Signed)
Called dietary to check on breakfast tray.

## 2014-06-13 NOTE — ED Provider Notes (Addendum)
Medical screening examination/treatment/procedure(s) were conducted as a shared visit with non-physician practitioner(s) and myself.  I personally evaluated the patient during the encounter.   EKG Interpretation   Date/Time:  Tuesday June 13 2014 [id number] EDT Ventricular Rate:  91 PR Interval:  136 QRS Duration: 102 QT Interval:  353 QTC Calculation: 434 R Axis:   48 Text Interpretation:  Sinus rhythm Atrial premature complexes Low voltage,  precordial leads No significant change was found Confirmed by [facility]  MD,  TREY ([id number]) on 06/13/2014 9:12:26 AM      49 yo female with hx of morbid obesity and asthma who presents with SOB.  On exam, nontoxic, not distressed, normal respiratory effort, normal perfusion, morbid obesity severely limits cardiopulmonary exam, but I was able to appreciate decent air movement without significant wheezing.  She was also found to have a urinary tract infection. Plan treatment with abx, steroids, and close PCP follow up.    Clinical Impression: 1. Asthma exacerbation   2. UTI (lower urinary tract infection)       Serita Grit, MD 06/13/14 1627  Serita Grit, MD 06/13/14 [phone]

## 2014-06-13 NOTE — ED Notes (Signed)
Pt remains in room eating breakfast tray, pt informed that Kirichenko, Wal[place] does recommends Tylenol for pain relief, pt given bus pass for transportation home

## 2014-06-13 NOTE — Discharge Instructions (Signed)
Take prednisone as prescribed until all gone for your breathing. Continue your neb treatments. Take keflex as prescribed until all gone for bladder infection. Follow up with your doctor as soon as able. Check on whether you should be restarting lasix.    Asthma, Acute Bronchospasm Acute bronchospasm caused by asthma is also referred to as an asthma attack. Bronchospasm means your air passages become narrowed. The narrowing is caused by inflammation and tightening of the muscles in the air tubes (bronchi) in your lungs. This can make it hard to breathe or cause you to wheeze and cough. CAUSES Possible triggers are:  Animal dander from the skin, hair, or feathers of animals.  Dust mites contained in house dust.  Cockroaches.  Pollen from trees or grass.  Mold.  Cigarette or tobacco smoke.  Air pollutants such as dust, household cleaners, hair sprays, aerosol sprays, paint fumes, strong chemicals, or strong odors.  Cold air or weather changes. Cold air may trigger inflammation. Winds increase molds and pollens in the air.  Strong emotions such as crying or laughing hard.  Stress.  Certain medicines such as aspirin or beta-blockers.  Sulfites in foods and drinks, such as dried fruits and wine.  Infections or inflammatory conditions, such as a flu, cold, or inflammation of the nasal membranes (rhinitis).  Gastroesophageal reflux disease (GERD). GERD is a condition where stomach acid backs up into your esophagus.  Exercise or strenuous activity. SIGNS AND SYMPTOMS   Wheezing.  Excessive coughing, particularly at night.  Chest tightness.  Shortness of breath. DIAGNOSIS  Your health care provider will ask you about your medical history and perform a physical exam. A chest X-ray or blood testing may be performed to look for other causes of your symptoms or other conditions that may have triggered your asthma attack. TREATMENT  Treatment is aimed at reducing inflammation and  opening up the airways in your lungs. Most asthma attacks are treated with inhaled medicines. These include quick relief or rescue medicines (such as bronchodilators) and controller medicines (such as inhaled corticosteroids). These medicines are sometimes given through an inhaler or a nebulizer. Systemic steroid medicine taken by mouth or given through an IV tube also can be used to reduce the inflammation when an attack is moderate or severe. Antibiotic medicines are only used if a bacterial infection is present.  HOME CARE INSTRUCTIONS   Rest.  Drink plenty of liquids. This helps the mucus to remain thin and be easily coughed up. Only use caffeine in moderation and do not use alcohol until you have recovered from your illness.  Do not smoke. Avoid being exposed to secondhand smoke.  You play a critical role in keeping yourself in good health. Avoid exposure to things that cause you to wheeze or to have breathing problems.  Keep your medicines up-to-date and available. Carefully follow your health care provider's treatment plan.  Take your medicine exactly as prescribed.  When pollen or pollution is bad, keep windows closed and use an air conditioner or go to places with air conditioning.  Asthma requires careful medical care. See your health care provider for a follow-up as advised. If you are more than [redacted] weeks pregnant and you were prescribed any new medicines, let your obstetrician know about the visit and how you are doing. Follow up with your health care provider as directed.  After you have recovered from your asthma attack, make an appointment with your outpatient doctor to talk about ways to reduce the likelihood of future attacks.  If you do not have a doctor who manages your asthma, make an appointment with a primary care doctor to discuss your asthma. SEEK IMMEDIATE MEDICAL CARE IF:   You are getting worse.  You have trouble breathing. If severe, call your local emergency  services (911 in the [place]  You develop chest pain or discomfort.  You are vomiting.  You are not able to keep fluids down.  You are coughing up yellow, green, brown, or bloody sputum.  You have a fever and your symptoms suddenly get worse.  You have trouble swallowing. MAKE SURE YOU:   Understand these instructions.  Will watch your condition.  Will get help right away if you are not doing well or get worse. Document Released: 06/25/2006 Document Revised: 03/15/2013 Document Reviewed: 09/15/2012 [facility] Patient Information 2015 [place], [place]. This information is not intended to replace advice given to you by your health care provider. Make sure you discuss any questions you have with your health care provider.  Urinary Tract Infection Urinary tract infections (UTIs) can develop anywhere along your urinary tract. Your urinary tract is your body's drainage system for removing wastes and extra water. Your urinary tract includes two kidneys, two ureters, a bladder, and a urethra. Your kidneys are a pair of bean-shaped organs. Each kidney is about the size of your fist. They are located below your ribs, one on each side of your spine. CAUSES Infections are caused by microbes, which are microscopic organisms, including fungi, viruses, and bacteria. These organisms are so small that they can only be seen through a microscope. Bacteria are the microbes that most commonly cause UTIs. SYMPTOMS  Symptoms of UTIs may vary by age and gender of the patient and by the location of the infection. Symptoms in young women typically include a frequent and intense urge to urinate and a painful, burning feeling in the bladder or urethra during urination. Older women and men are more likely to be tired, shaky, and weak and have muscle aches and abdominal pain. A fever may mean the infection is in your kidneys. Other symptoms of a kidney infection include pain in your back or sides below the ribs, nausea,  and vomiting. DIAGNOSIS To diagnose a UTI, your caregiver will ask you about your symptoms. Your caregiver also will ask to provide a urine sample. The urine sample will be tested for bacteria and white blood cells. White blood cells are made by your body to help fight infection. TREATMENT  Typically, UTIs can be treated with medication. Because most UTIs are caused by a bacterial infection, they usually can be treated with the use of antibiotics. The choice of antibiotic and length of treatment depend on your symptoms and the type of bacteria causing your infection. HOME CARE INSTRUCTIONS  If you were prescribed antibiotics, take them exactly as your caregiver instructs you. Finish the medication even if you feel better after you have only taken some of the medication.  Drink enough water and fluids to keep your urine clear or pale yellow.  Avoid caffeine, tea, and carbonated beverages. They tend to irritate your bladder.  Empty your bladder often. Avoid holding urine for long periods of time.  Empty your bladder before and after sexual intercourse.  After a bowel movement, women should cleanse from front to back. Use each tissue only once. SEEK MEDICAL CARE IF:   You have back pain.  You develop a fever.  Your symptoms do not begin to resolve within 3 days. [place]  CARE IF:   You have severe back pain or lower abdominal pain.  You develop chills.  You have nausea or vomiting.  You have continued burning or discomfort with urination. MAKE SURE YOU:   Understand these instructions.  Will watch your condition.  Will get help right away if you are not doing well or get worse. Document Released: 12/18/2004 Document Revised: 09/09/2011 Document Reviewed: 04/18/2011 [facility] Patient Information 2015 [place], [place]. This information is not intended to replace advice given to you by your health care provider. Make sure you discuss any questions you have with your  health care provider.

## 2014-06-13 NOTE — ED Notes (Signed)
Pt aware of need for Urine for UA, pt provided bedside commode

## 2014-06-15 LAB — URINE CULTURE

## 2014-06-16 ENCOUNTER — Telehealth (HOSPITAL_BASED_OUTPATIENT_CLINIC_OR_DEPARTMENT_OTHER): Payer: Self-pay | Admitting: Emergency Medicine

## 2014-06-16 NOTE — Telephone Encounter (Signed)
Post ED Visit - Positive Culture Follow-up  Culture report reviewed by antimicrobial stewardship pharmacist: []  Wes [place], Pharm.D., BCPS []  Heide Guile, Pharm.D., BCPS []  Alycia Rossetti, Pharm.D., BCPS [x]  [place], Pharm.D., BCPS, AAHIVP []  Legrand [place], Pharm.D., BCPS, AAHIVP []  Isac Sarna, Pharm.D., BCPS  Positive urine culture E. Coli Treated with cephalexin, organism sensitive to the same and no further patient follow-up is required at this time.  Hazle Nordmann 06/16/2014, 9:45 AM

## 2014-06-19 ENCOUNTER — Encounter (HOSPITAL_COMMUNITY): Payer: Self-pay | Admitting: Emergency Medicine

## 2014-06-19 ENCOUNTER — Emergency Department (HOSPITAL_COMMUNITY)
Admission: EM | Admit: 2014-06-19 | Discharge: 2014-06-20 | Disposition: A | Discharge status: Home / Self Care | Payer: Medicaid Other | Attending: Emergency Medicine | Admitting: Emergency Medicine

## 2014-06-19 DIAGNOSIS — R0600 Dyspnea, unspecified: Secondary | ICD-10-CM

## 2014-06-19 DIAGNOSIS — M791 Myalgia: Secondary | ICD-10-CM | POA: Insufficient documentation

## 2014-06-19 DIAGNOSIS — B359 Dermatophytosis, unspecified: Secondary | ICD-10-CM

## 2014-06-19 DIAGNOSIS — Z79899 Other long term (current) drug therapy: Secondary | ICD-10-CM | POA: Insufficient documentation

## 2014-06-19 DIAGNOSIS — Z8614 Personal history of Methicillin resistant Staphylococcus aureus infection: Secondary | ICD-10-CM | POA: Insufficient documentation

## 2014-06-19 DIAGNOSIS — Z880 Allergy status to penicillin: Secondary | ICD-10-CM | POA: Diagnosis not present

## 2014-06-19 DIAGNOSIS — E785 Hyperlipidemia, unspecified: Secondary | ICD-10-CM | POA: Insufficient documentation

## 2014-06-19 DIAGNOSIS — M1990 Unspecified osteoarthritis, unspecified site: Secondary | ICD-10-CM | POA: Insufficient documentation

## 2014-06-19 DIAGNOSIS — I509 Heart failure, unspecified: Secondary | ICD-10-CM | POA: Diagnosis not present

## 2014-06-19 DIAGNOSIS — R1084 Generalized abdominal pain: Secondary | ICD-10-CM

## 2014-06-19 DIAGNOSIS — Z590 Homelessness: Secondary | ICD-10-CM | POA: Diagnosis not present

## 2014-06-19 DIAGNOSIS — M109 Gout, unspecified: Secondary | ICD-10-CM | POA: Diagnosis not present

## 2014-06-19 DIAGNOSIS — Q828 Other specified congenital malformations of skin: Secondary | ICD-10-CM | POA: Insufficient documentation

## 2014-06-19 DIAGNOSIS — R0602 Shortness of breath: Secondary | ICD-10-CM | POA: Diagnosis present

## 2014-06-19 DIAGNOSIS — J45901 Unspecified asthma with (acute) exacerbation: Secondary | ICD-10-CM | POA: Insufficient documentation

## 2014-06-19 DIAGNOSIS — Z87891 Personal history of nicotine dependence: Secondary | ICD-10-CM | POA: Diagnosis not present

## 2014-06-19 DIAGNOSIS — K219 Gastro-esophageal reflux disease without esophagitis: Secondary | ICD-10-CM | POA: Insufficient documentation

## 2014-06-19 DIAGNOSIS — Z7982 Long term (current) use of aspirin: Secondary | ICD-10-CM | POA: Insufficient documentation

## 2014-06-19 DIAGNOSIS — B354 Tinea corporis: Secondary | ICD-10-CM | POA: Diagnosis not present

## 2014-06-19 DIAGNOSIS — I10 Essential (primary) hypertension: Secondary | ICD-10-CM | POA: Diagnosis not present

## 2014-06-19 NOTE — ED Notes (Signed)
Per EMS: Pt diagnosed with kidney infection on the 21st, reports she has been taking Keflex without improvement in sx (lower abdominal pain and difficulty urinating). Pt also reports feeling SOB for about 5 days. Oxygen 94% on RA, 2 L [place] brought her up to 97%. Lung sounds clear. Pt also c/o rash to torso that is "normal for her." Pt thinks she may be having allergic reaction to Keflex because of SOB and rash. Pt on 4th day of taking Keflex and has also taken it in the past without problems with it.

## 2014-06-20 LAB — BASIC METABOLIC PANEL
Anion gap: 7 (ref 5–15)
BUN: 23 mg/dL (ref 6–23)
CALCIUM: 8.1 mg/dL — AB (ref 8.4–10.5)
CO2: 37 mmol/L — AB (ref 19–32)
CREATININE: 1.48 mg/dL — AB (ref 0.50–1.10)
Chloride: 102 mmol/L (ref 96–112)
GFR calc non Af Amer: 40 mL/min — ABNORMAL LOW (ref >90)
GFR, EST AFRICAN AMERICAN: 47 mL/min — AB (ref >90)
GLUCOSE: 100 mg/dL — AB (ref 70–99)
Potassium: 4 mmol/L (ref 3.5–5.1)
SODIUM: 146 mmol/L — AB (ref 135–145)

## 2014-06-20 LAB — URINALYSIS, ROUTINE W REFLEX MICROSCOPIC
Bilirubin Urine: NEGATIVE
GLUCOSE, UA: NEGATIVE mg/dL
HGB URINE DIPSTICK: NEGATIVE
KETONES UR: NEGATIVE mg/dL
Leukocytes, UA: NEGATIVE
Nitrite: NEGATIVE
Protein, ur: NEGATIVE mg/dL
Specific Gravity, Urine: 1.02 (ref 1.005–1.030)
UROBILINOGEN UA: 0.2 mg/dL (ref 0.0–1.0)
pH: 5 (ref 5.0–8.0)

## 2014-06-20 MED ORDER — IPRATROPIUM BROMIDE 0.02 % IN SOLN
0.5000 mg | Freq: Once | RESPIRATORY_TRACT | Status: AC
  Administered 2014-06-20: 0.5 mg via RESPIRATORY_TRACT
  Filled 2014-06-20: qty 2.5

## 2014-06-20 MED ORDER — ALBUTEROL SULFATE (2.5 MG/3ML) 0.083% IN NEBU
5.0000 mg | INHALATION_SOLUTION | Freq: Once | RESPIRATORY_TRACT | Status: AC
  Administered 2014-06-20: 5 mg via RESPIRATORY_TRACT
  Filled 2014-06-20: qty 6

## 2014-06-20 MED ORDER — CLOTRIMAZOLE 1 % EX CREA: TOPICAL_CREAM | CUTANEOUS | Status: DC

## 2014-06-20 MED ORDER — TRAMADOL HCL 50 MG PO TABS: 50.0000 mg | ORAL_TABLET | Freq: Four times a day (QID) | ORAL | Status: DC | PRN

## 2014-06-20 NOTE — ED Provider Notes (Signed)
CSN: [id number]     Arrival date & time 06/19/14  2127 History   First MD Initiated Contact with Patient 06/20/14 0128     Chief Complaint  Patient presents with  . Shortness of Breath     (Consider location/radiation/quality/duration/timing/severity/associated sxs/prior Treatment) Patient is a 49 y.o. female presenting with shortness of breath. The history is provided by the patient. No language interpreter was used.  Shortness of Breath Severity:  Moderate Associated symptoms: abdominal pain and rash   Associated symptoms: no fever and no vomiting   Associated symptoms comment:  She arrives with complaint of SOB with history of asthma. She reports increased peripheral swelling and history of CHF. She has body aches. No fever. No vomiting.  She reports abdominal pain since being diagnosed with UTI recently. She states she does not feel the infection has cleared.  Rash:    Location: Chronic rash to folds of skin of neck "and lots of other places".   Past Medical History  Diagnosis Date  . Asthma   . GERD (gastroesophageal reflux disease)   . Hypertension   . Homelessness   . Low back pain   . 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 Arthritis   . Gout   . Hyperlipemia   . Morbid obesity     uses motor wheel chair  . MRSA (methicillin resistant Staphylococcus aureus)     states about a year ago  . CHF (congestive heart failure)    Past Surgical History  Procedure Laterality Date  . Tubal ligation    . Cesarean section     Family History  Problem Relation Age of Onset  . Asthma Mother   . Diabetes Father   . Heart disease Father   . Stroke Father    History  Substance Use Topics  . Smoking status: Former Smoker    Types: Cigarettes    Quit date: 03/25/2003  . Smokeless tobacco: Former Systems developer    Quit date: 05/27/1978  . Alcohol Use: No   OB History    No data available     Review of Systems  Constitutional: Negative for fever.  HENT: Negative  for congestion.   Respiratory: Positive for shortness of breath.   Gastrointestinal: Positive for abdominal pain. Negative for vomiting.  Genitourinary: Positive for dysuria.  Musculoskeletal: Positive for myalgias.  Skin: Positive for rash.  Neurological: Negative for weakness.      Allergies  Ibuprofen; Adhesive; Azithromycin; Doxycycline; Penicillins; Sulfa antibiotics; and Sulfamethoxazole-trimethoprim  Home Medications   Prior to Admission medications   Medication Sig Start Date End Date Taking? Authorizing Provider  albuterol (PROVENTIL HFA) 108 (90 BASE) MCG/ACT inhaler Inhale 2 puffs into the lungs every 4 (four) hours as needed for wheezing or shortness of breath. 01/22/14  Yes Donne Hazel, MD  albuterol (PROVENTIL) (2.5 MG/3ML) 0.083% nebulizer solution Take 2.5 mg by nebulization every 6 (six) hours as needed for wheezing or shortness of breath (wheezing).    Yes Historical Provider, MD  allopurinol (ZYLOPRIM) 100 MG tablet Take 1 tablet (100 mg total) by mouth every morning. 01/22/14  Yes Donne Hazel, MD  ALPRAZolam Duanne Moron) 1 MG tablet Take 1 mg by mouth at bedtime as needed for anxiety (anxiety).   Yes Historical Provider, MD  ammonium lactate (AMLACTIN) 12 % cream Apply 1 g topically daily as needed for dry skin. 04/22/14  Yes Linton Flemings, MD  aspirin 325 MG tablet Take 325 mg by mouth  every morning.    Yes Historical Provider, MD  atorvastatin (LIPITOR) 20 MG tablet Take 1 tablet (20 mg total) by mouth every morning. 01/22/14  Yes Donne Hazel, MD  cephALEXin (KEFLEX) 500 MG capsule Take 1 capsule (500 mg total) by mouth 4 (four) times daily. 06/13/14  Yes Tatyana Kirichenko, PA-C  diphenhydrAMINE (BENADRYL) 25 mg capsule Take 25 mg by mouth every 6 (six) hours as needed for itching or allergies (allergies).    Yes Historical Provider, MD  Emollient (EUCERIN) lotion Apply 5 mLs topically 2 (two) times daily as needed for dry skin. Patient taking differently: Apply 5 mLs  topically 3 (three) times daily.  04/22/14  Yes Linton Flemings, MD  folic acid (FOLVITE) 1 MG tablet Take 1 tablet (1 mg total) by mouth daily. 01/22/14  Yes Donne Hazel, MD  guaiFENesin (MUCINEX) 600 MG 12 hr tablet Take 600 mg by mouth 2 (two) times daily.   Yes Historical Provider, MD  HYDROcodone-acetaminophen (NORCO/VICODIN) 5-325 MG per tablet Take 2 tablets by mouth every 4 (four) hours as needed. 05/18/14  Yes Tanna Furry, MD  hydrOXYzine (ATARAX/VISTARIL) 25 MG tablet Take 25 mg by mouth 3 (three) times daily as needed for anxiety or itching (anxiety and itching).    Yes Historical Provider, MD  ketoconazole (NIZORAL) 2 % shampoo Apply 1 application topically daily as needed for irritation (irritation).    Yes Historical Provider, MD  ketoconazole (NIZORAL) 200 MG tablet Take 200 mg by mouth every morning.    Yes Historical Provider, MD  Multiple Vitamin (MULTIVITAMIN WITH MINERALS) TABS tablet Take 1 tablet by mouth daily. 01/22/14  Yes Donne Hazel, MD  pantoprazole (PROTONIX) 40 MG tablet Take 1 tablet (40 mg total) by mouth every morning. 01/22/14  Yes Donne Hazel, MD  solifenacin (VESICARE) 5 MG tablet Take 5 mg by mouth every morning.    Yes Historical Provider, MD  vitamin B-12 (CYANOCOBALAMIN) 1000 MCG tablet Take 1 tablet (1,000 mcg total) by mouth every morning. 01/22/14  Yes Donne Hazel, MD  vitamin E 100 UNIT capsule Take 1 capsule (100 Units total) by mouth every morning. 01/22/14  Yes Donne Hazel, MD  clindamycin (CLEOCIN) 150 MG capsule Take 1 capsule (150 mg total) by mouth 3 (three) times daily. Patient not taking: Reported on 06/19/2014 05/18/14   Tanna Furry, MD  furosemide (LASIX) 40 MG tablet Take 1 tablet (40 mg total) by mouth 2 (two) times daily. Patient not taking: Reported on 06/19/2014 01/22/14   Donne Hazel, MD  ketorolac (TORADOL) 10 MG tablet Take 1 tablet (10 mg total) by mouth every 6 (six) hours as needed for severe pain. Patient not taking: Reported on  06/19/2014 01/23/14   Donne Hazel, MD  predniSONE (DELTASONE) 10 MG tablet Take 5 tab day 1, take 4 tab day 2, take 3 tab day 3, take 2 tab day 4, and take 1 tab day 5 Patient not taking: Reported on 06/19/2014 06/13/14   Tatyana Kirichenko, PA-C   BP 103/44 mmHg  Pulse 84  Temp(Src) 98.5 F (36.9 C) (Oral)  Resp 18  SpO2 96%  LMP 03/24/2009 Physical Exam  Constitutional: She is oriented to person, place, and time. She appears well-developed and well-nourished. No distress.  Eyes: Conjunctivae are normal.  Neck: Normal range of motion. Neck supple.  Cardiovascular: Normal rate.   No murmur heard. Pulmonary/Chest: Effort normal. She has no wheezes. She has no rales.  Abdominal: Soft. There is tenderness.  Diffuse tenderness to soft, morbidly obese abdomen.  Musculoskeletal: Normal range of motion.  Neurological: She is alert and oriented to person, place, and time.  Skin: Skin is warm and dry.  Thick, plaque-like rash to neck in the fold of skin secondary to obesity.   Psychiatric: She has a normal mood and affect.    ED Course  Procedures (including critical care time) Labs Review Labs Reviewed  URINALYSIS, ROUTINE W REFLEX MICROSCOPIC  BASIC METABOLIC PANEL    Imaging Review No results found.   EKG Interpretation None      MDM   Final diagnoses:  None    1. Dyspnea 2. Tinea 3. Abdominal pain 4. Morbid obesity  The patient has appeared comfortable and in NAD throughout ED visit. VSS. She is speaking in full sentences. Lungs without wheezing and moving air well. No rales c/w CHF and no peripheral edema. Urine is clear of infection. The patient is felt appropriate for discharge home.     Charlann Lange, PA-C 06/20/14 [place], MD 06/20/14 [id number]

## 2014-06-20 NOTE — Discharge Instructions (Signed)
Body Ringworm Ringworm (tinea corporis) is a fungal infection of the skin on the body. This infection is not caused by worms, but is actually caused by a fungus. Fungus normally lives on the top of your skin and can be useful. However, in the case of ringworms, the fungus grows out of control and causes a skin infection. It can involve any area of skin on the body and can spread easily from one person to another (contagious). Ringworm is a common problem for children, but it can affect adults as well. Ringworm is also often found in athletes, especially wrestlers who share equipment and mats.  CAUSES  Ringworm of the body is caused by a fungus called dermatophyte. It can spread by:  Touchingother people who are infected.  Touchinginfected pets.  Touching or sharingobjects that have been in contact with the infected person or pet (hats, combs, towels, clothing, sports equipment). SYMPTOMS   Itchy, raised red spots and bumps on the skin.  Ring-shaped rash.  Redness near the border of the rash with a clear center.  Dry and scaly skin on or around the rash. Not every person develops a ring-shaped rash. Some develop only the red, scaly patches. DIAGNOSIS  Most often, ringworm can be diagnosed by performing a skin exam. Your caregiver may choose to take a skin scraping from the affected area. The sample will be examined under the microscope to see if the fungus is present.  TREATMENT  Body ringworm may be treated with a topical antifungal cream or ointment. Sometimes, an antifungal shampoo that can be used on your body is prescribed. You may be prescribed antifungal medicines to take by mouth if your ringworm is severe, keeps coming back, or lasts a long time.  HOME CARE INSTRUCTIONS   Only take over-the-counter or prescription medicines as directed by your caregiver.  Wash the infected area and dry it completely before applying yourcream or ointment.  When using antifungal shampoo to  treat the ringworm, leave the shampoo on the body for 3-5 minutes before rinsing.   Wear loose clothing to stop clothes from rubbing and irritating the rash.  Wash or change your bed sheets every night while you have the rash.  Have your pet treated by your veterinarian if it has the same infection. To prevent ringworm:   Practice good hygiene.  Wear sandals or shoes in public places and showers.  Do not share personal items with others.  Avoid touching red patches of skin on other people.  Avoid touching pets that have bald spots or wash your hands after doing so. SEEK MEDICAL CARE IF:   Your rash continues to spread after 7 days of treatment.  Your rash is not gone in 4 weeks.  The area around your rash becomes red, warm, tender, and swollen. Document Released: 03/07/2000 Document Revised: 12/03/2011 Document Reviewed: 09/22/2011 [employer] Patient Information 2015 [employer]. This information is not intended to replace advice given to you by your health care provider. Make sure you discuss any questions you have with your health care provider.  

## 2014-07-03 NOTE — Telephone Encounter (Signed)
Entered in error

## 2014-07-08 ENCOUNTER — Emergency Department (HOSPITAL_COMMUNITY)
Admission: EM | Admit: 2014-07-08 | Discharge: 2014-07-08 | Disposition: A | Discharge status: Home / Self Care | Payer: Medicaid Other | Attending: Emergency Medicine | Admitting: Emergency Medicine

## 2014-07-08 ENCOUNTER — Emergency Department (HOSPITAL_COMMUNITY): Payer: Medicaid Other

## 2014-07-08 DIAGNOSIS — R079 Chest pain, unspecified: Secondary | ICD-10-CM | POA: Diagnosis not present

## 2014-07-08 DIAGNOSIS — Q828 Other specified congenital malformations of skin: Secondary | ICD-10-CM | POA: Insufficient documentation

## 2014-07-08 DIAGNOSIS — Z8619 Personal history of other infectious and parasitic diseases: Secondary | ICD-10-CM | POA: Insufficient documentation

## 2014-07-08 DIAGNOSIS — Z79899 Other long term (current) drug therapy: Secondary | ICD-10-CM | POA: Diagnosis not present

## 2014-07-08 DIAGNOSIS — Z7982 Long term (current) use of aspirin: Secondary | ICD-10-CM | POA: Diagnosis not present

## 2014-07-08 DIAGNOSIS — I509 Heart failure, unspecified: Secondary | ICD-10-CM | POA: Insufficient documentation

## 2014-07-08 DIAGNOSIS — Z792 Long term (current) use of antibiotics: Secondary | ICD-10-CM | POA: Insufficient documentation

## 2014-07-08 DIAGNOSIS — Z87891 Personal history of nicotine dependence: Secondary | ICD-10-CM | POA: Diagnosis not present

## 2014-07-08 DIAGNOSIS — Z590 Homelessness: Secondary | ICD-10-CM | POA: Diagnosis not present

## 2014-07-08 DIAGNOSIS — R21 Rash and other nonspecific skin eruption: Secondary | ICD-10-CM | POA: Diagnosis not present

## 2014-07-08 DIAGNOSIS — I10 Essential (primary) hypertension: Secondary | ICD-10-CM | POA: Diagnosis not present

## 2014-07-08 DIAGNOSIS — J45901 Unspecified asthma with (acute) exacerbation: Secondary | ICD-10-CM | POA: Insufficient documentation

## 2014-07-08 DIAGNOSIS — K219 Gastro-esophageal reflux disease without esophagitis: Secondary | ICD-10-CM | POA: Insufficient documentation

## 2014-07-08 DIAGNOSIS — Z880 Allergy status to penicillin: Secondary | ICD-10-CM | POA: Insufficient documentation

## 2014-07-08 DIAGNOSIS — E785 Hyperlipidemia, unspecified: Secondary | ICD-10-CM | POA: Insufficient documentation

## 2014-07-08 DIAGNOSIS — M1990 Unspecified osteoarthritis, unspecified site: Secondary | ICD-10-CM | POA: Insufficient documentation

## 2014-07-08 DIAGNOSIS — J45909 Unspecified asthma, uncomplicated: Secondary | ICD-10-CM | POA: Insufficient documentation

## 2014-07-08 LAB — BRAIN NATRIURETIC PEPTIDE: B NATRIURETIC PEPTIDE 5: 108.9 pg/mL — AB (ref 0.0–100.0)

## 2014-07-08 LAB — BASIC METABOLIC PANEL
ANION GAP: 10 (ref 5–15)
BUN: 11 mg/dL (ref 6–23)
CHLORIDE: 107 mmol/L (ref 96–112)
CO2: 26 mmol/L (ref 19–32)
CREATININE: 1.38 mg/dL — AB (ref 0.50–1.10)
Calcium: 8.3 mg/dL — ABNORMAL LOW (ref 8.4–10.5)
GFR calc Af Amer: 51 mL/min — ABNORMAL LOW (ref >90)
GFR calc non Af Amer: 44 mL/min — ABNORMAL LOW (ref >90)
Glucose, Bld: 97 mg/dL (ref 70–99)
POTASSIUM: 3.7 mmol/L (ref 3.5–5.1)
SODIUM: 143 mmol/L (ref 135–145)

## 2014-07-08 LAB — I-STAT TROPONIN, ED: Troponin i, poc: 0 ng/mL (ref 0.00–0.08)

## 2014-07-08 LAB — CBC
HCT: 40 % (ref 36.0–46.0)
Hemoglobin: 12.1 g/dL (ref 12.0–15.0)
MCH: 28.5 pg (ref 26.0–34.0)
MCHC: 30.3 g/dL (ref 30.0–36.0)
MCV: 94.3 fL (ref 78.0–100.0)
Platelets: 252 10*3/uL (ref 150–400)
RBC: 4.24 MIL/uL (ref 3.87–5.11)
RDW: 15.2 % (ref 11.5–15.5)
WBC: 9.1 10*3/uL (ref 4.0–10.5)

## 2014-07-08 MED ORDER — ACETAMINOPHEN 325 MG PO TABS
650.0000 mg | ORAL_TABLET | Freq: Once | ORAL | Status: AC
  Administered 2014-07-08: 650 mg via ORAL

## 2014-07-08 NOTE — ED Provider Notes (Signed)
CSN: [id number]     Arrival date & time 07/08/14  0057 History  This chart was scribed for Daleen Bo, MD by Eustaquio Maize, ED Scribe. This patient was seen in room A01C/A01C and the patient's care was started at 1:35 AM.    Chief Complaint  Patient presents with  . Chest Pain  . Rash   The history is provided by the patient. No language interpreter was used.     HPI Comments: Kara Vincent is a 49 y.o. female brought in by ambulance, with hx Asthma, HTN, HLD, CHF, Darier Disease who presents to the Emergency Department complaining of rash to upper and lower extremities that began 3-4 days ago, worsening today. Pt notes itching, pain, and swelling to the area. She mentions previous rash to chest but states that this rash is different. Pt is having difficulty walking due to the pain. She also complains of shortness of breath and chest pain. Pt was given 324 mg aspirin en route. Pt is asking for pain medication during examination and states that Percocet works for her.    Past Medical History  Diagnosis Date  . Asthma   . GERD (gastroesophageal reflux disease)   . Hypertension   . Homelessness   . Low back pain   . 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 Arthritis   . Gout   . Hyperlipemia   . Morbid obesity     uses motor wheel chair  . MRSA (methicillin resistant Staphylococcus aureus)     states about a year ago  . CHF (congestive heart failure)    Past Surgical History  Procedure Laterality Date  . Tubal ligation    . Cesarean section     Family History  Problem Relation Age of Onset  . Asthma Mother   . Diabetes Father   . Heart disease Father   . Stroke Father    History  Substance Use Topics  . Smoking status: Former Smoker    Types: Cigarettes    Quit date: 03/25/2003  . Smokeless tobacco: Former Systems developer    Quit date: 05/27/1978  . Alcohol Use: No   OB History    No data available     Review of Systems  Respiratory: Positive for  shortness of breath.   Cardiovascular: Positive for chest pain.  Skin: Positive for rash.       Rash to upper and lower extremities.   All other systems reviewed and are negative.     Allergies  Ibuprofen; Adhesive; Azithromycin; Doxycycline; Penicillins; Sulfa antibiotics; and Sulfamethoxazole-trimethoprim  Home Medications   Prior to Admission medications   Medication Sig Start Date End Date Taking? Authorizing Provider  albuterol (PROVENTIL HFA) 108 (90 BASE) MCG/ACT inhaler Inhale 2 puffs into the lungs every 4 (four) hours as needed for wheezing or shortness of breath. 01/22/14   Donne Hazel, MD  albuterol (PROVENTIL) (2.5 MG/3ML) 0.083% nebulizer solution Take 2.5 mg by nebulization every 6 (six) hours as needed for wheezing or shortness of breath (wheezing).     Historical Provider, MD  allopurinol (ZYLOPRIM) 100 MG tablet Take 1 tablet (100 mg total) by mouth every morning. 01/22/14   Donne Hazel, MD  ALPRAZolam Duanne Moron) 1 MG tablet Take 1 mg by mouth at bedtime as needed for anxiety (anxiety).    Historical Provider, MD  ammonium lactate (AMLACTIN) 12 % cream Apply 1 g topically daily as needed for dry skin. 04/22/14   Michelene Heady  Sharol Given, MD  aspirin 325 MG tablet Take 325 mg by mouth every morning.     Historical Provider, MD  atorvastatin (LIPITOR) 20 MG tablet Take 1 tablet (20 mg total) by mouth every morning. 01/22/14   Donne Hazel, MD  cephALEXin (KEFLEX) 500 MG capsule Take 1 capsule (500 mg total) by mouth 4 (four) times daily. 06/13/14   Tatyana Kirichenko, PA-C  clindamycin (CLEOCIN) 150 MG capsule Take 1 capsule (150 mg total) by mouth 3 (three) times daily. Patient not taking: Reported on 06/19/2014 05/18/14   Tanna Furry, MD  clotrimazole (LOTRIMIN) 1 % cream Apply to affected area 2 times daily 06/20/14   Charlann Lange, PA-C  diphenhydrAMINE (BENADRYL) 25 mg capsule Take 25 mg by mouth every 6 (six) hours as needed for itching or allergies (allergies).     Historical  Provider, MD  Emollient (EUCERIN) lotion Apply 5 mLs topically 2 (two) times daily as needed for dry skin. Patient taking differently: Apply 5 mLs topically 3 (three) times daily.  04/22/14   Linton Flemings, MD  folic acid (FOLVITE) 1 MG tablet Take 1 tablet (1 mg total) by mouth daily. 01/22/14   Donne Hazel, MD  furosemide (LASIX) 40 MG tablet Take 1 tablet (40 mg total) by mouth 2 (two) times daily. Patient not taking: Reported on 06/19/2014 01/22/14   Donne Hazel, MD  guaiFENesin (MUCINEX) 600 MG 12 hr tablet Take 600 mg by mouth 2 (two) times daily.    Historical Provider, MD  HYDROcodone-acetaminophen (NORCO/VICODIN) 5-325 MG per tablet Take 2 tablets by mouth every 4 (four) hours as needed. 05/18/14   Tanna Furry, MD  hydrOXYzine (ATARAX/VISTARIL) 25 MG tablet Take 25 mg by mouth 3 (three) times daily as needed for anxiety or itching (anxiety and itching).     Historical Provider, MD  ketoconazole (NIZORAL) 2 % shampoo Apply 1 application topically daily as needed for irritation (irritation).     Historical Provider, MD  ketoconazole (NIZORAL) 200 MG tablet Take 200 mg by mouth every morning.     Historical Provider, MD  ketorolac (TORADOL) 10 MG tablet Take 1 tablet (10 mg total) by mouth every 6 (six) hours as needed for severe pain. Patient not taking: Reported on 06/19/2014 01/23/14   Donne Hazel, MD  Multiple Vitamin (MULTIVITAMIN WITH MINERALS) TABS tablet Take 1 tablet by mouth daily. 01/22/14   Donne Hazel, MD  pantoprazole (PROTONIX) 40 MG tablet Take 1 tablet (40 mg total) by mouth every morning. 01/22/14   Donne Hazel, MD  predniSONE (DELTASONE) 10 MG tablet Take 5 tab day 1, take 4 tab day 2, take 3 tab day 3, take 2 tab day 4, and take 1 tab day 5 Patient not taking: Reported on 06/19/2014 06/13/14   Lahoma Rocker Kirichenko, PA-C  solifenacin (VESICARE) 5 MG tablet Take 5 mg by mouth every morning.     Historical Provider, MD  traMADol (ULTRAM) 50 MG tablet Take 1 tablet (50 mg total)  by mouth every 6 (six) hours as needed. 06/20/14   Charlann Lange, PA-C  vitamin B-12 (CYANOCOBALAMIN) 1000 MCG tablet Take 1 tablet (1,000 mcg total) by mouth every morning. 01/22/14   Donne Hazel, MD  vitamin E 100 UNIT capsule Take 1 capsule (100 Units total) by mouth every morning. 01/22/14   Donne Hazel, MD   Triage Vitals: BP 109/68 mmHg  Pulse 76  Temp(Src) 98.4 F (36.9 C) (Oral)  Resp 22  SpO2 96%  LMP  03/24/2009   Physical Exam  Constitutional: She is oriented to person, place, and time. She appears well-developed.  Morbidly obese  HENT:  Head: Normocephalic and atraumatic.  Eyes: Conjunctivae and EOM are normal. Pupils are equal, round, and reactive to light.  Neck: Normal range of motion and phonation normal. Neck supple.  Cardiovascular: Normal rate and regular rhythm.   Pulmonary/Chest: Effort normal. She has wheezes. She has no rhonchi. She has no rales. She exhibits no tenderness.  Abdominal: Soft. She exhibits no distension. There is no tenderness. There is no guarding.  Musculoskeletal: Normal range of motion.  Diffuse tenderness of the legs bilaterally on areas that have rash as well as some areas that do not have rash.  Neurological: She is alert and oriented to person, place, and time. She exhibits normal muscle tone.  Skin: Skin is warm and dry.  Generalized excoriated rash, nonspecific appearance. No vesicles or petechiae. She has a underlying rash of chest and posterior legs consistent with pervious diagnoses of Darier Disease.   Psychiatric: She has a normal mood and affect. Her behavior is normal. Judgment and thought content normal.  Nursing note and vitals reviewed.   ED Course  Procedures (including critical care time)  DIAGNOSTIC STUDIES: Oxygen Saturation is 96% on RA, normal by my interpretation.    COORDINATION OF CARE:  Medications  acetaminophen (TYLENOL) tablet 650 mg (650 mg Oral Given 07/08/14 0206)    Patient Vitals for the past 24  hrs:  BP Temp Temp src Pulse Resp SpO2 Height Weight  07/08/14 0334 117/61 mmHg 98.1 F (36.7 C) Oral 76 20 100 % - -  07/08/14 0208 - - - - - - 5\' 5"  (1.651 m) (!) 414 lb 3 oz (187.874 kg)  07/08/14 0200 102/58 mmHg - - 70 16 100 % - -  07/08/14 0120 109/68 mmHg 98.4 F (36.9 C) Oral 76 22 96 % - -     1:42 AM-Discussed treatment plan which includes Tylenol with pt at bedside and pt agreed to plan.   2:57 AM- Discussed following up with dermatologist, Dr. Nevada Crane, and using anti-itch lotion. Pt understands and agrees with this plan. Pt asked for prescription for narcotics again.   Labs Review Labs Reviewed  BASIC METABOLIC PANEL - Abnormal; Notable for the following:    Creatinine, Ser 1.38 (*)    Calcium 8.3 (*)    GFR calc non Af Amer 44 (*)    GFR calc Af Amer 51 (*)    All other components within normal limits  BRAIN NATRIURETIC PEPTIDE - Abnormal; Notable for the following:    B Natriuretic Peptide 108.9 (*)    All other components within normal limits  CBC  I-STAT TROPOININ, ED   Dg Chest Port 1 View  07/08/2014   CLINICAL DATA:  Chest pain  EXAM: PORTABLE CHEST - 1 VIEW  COMPARISON:  06/13/2014  FINDINGS: Limited study due to patient size and hypoventilation. There is no gross edema or pneumonia, with right infrahilar density chronic and likely from fat pad. Chronic cardiomegaly and upper mediastinal widening.  IMPRESSION: No acute findings on this limited portable chest.   Electronically Signed   By: Monte Fantasia M.D.   On: 07/08/2014 01:59     EKG Interpretation   Date/Time:  Saturday July 08 2014 01:10:32 EDT Ventricular Rate:  74 PR Interval:  147 QRS Duration: 101 QT Interval:  399 QTC Calculation: 443 R Axis:   48 Text Interpretation:  Sinus rhythm Low voltage,  precordial leads since  last tracing no significant change Confirmed by [facility]  MD, Demarius Archila [phone])  on 07/08/2014 1:31:07 AM      MDM   Final diagnoses:  Rash    Nonspecific rash, most  consistent with self-inflicted excoriations and mild inflammation. Doubt serious bacterial infection. Metabolic instability or impending vascular collapse   Nursing Notes Reviewed/ Care Coordinated Applicable Imaging Reviewed Interpretation of Laboratory Data incorporated into ED treatment  The patient appears reasonably screened and/or stabilized for discharge and I doubt any other medical condition or other [facility] requiring further screening, evaluation, or treatment in the ED at this time prior to discharge.  Plan: Home Medications- usual; Home Treatments- Wound care; return here if the recommended treatment, does not improve the symptoms; Recommended follow up- PCP 1 week     I personally performed the services described in this documentation, which was scribed in my presence. The recorded information has been reviewed and is accurate.      Daleen Bo, MD 07/08/14 [phone]

## 2014-07-08 NOTE — ED Notes (Signed)
Per EMS, initial call for shortness of breath and rash on her body. Bilateral leg pain and sores on body. Chest pain for 4 days once on the ems truck. Swollen legs reported. Sharp chest pain that radiates to right side. 324mg  of aspirin given en route. 3L of o2 at home, not wearing on ems arrival. Lung sounds clear. BP 163/81, P 78, RR 18, O2 Sat 95%, cbg 122.

## 2014-07-08 NOTE — ED Notes (Signed)
Provided patient sandwich.

## 2014-07-08 NOTE — ED Notes (Signed)
[facility] present to pick up the patient.

## 2014-07-08 NOTE — ED Notes (Signed)
ekg given to Dr Wentz 

## 2014-07-08 NOTE — Discharge Instructions (Signed)
Wash well with soap and water twice a day.  Apply a lotion, to help with the discomfort, from the rash.  Use your itching medicine as needed.

## 2014-07-08 NOTE — ED Notes (Signed)
Called [facility] for Transport  °

## 2014-07-08 NOTE — ED Notes (Signed)
Patient provides address for [facility] transport home: [address] [place].

## 2014-07-15 IMAGING — CR DG ABDOMEN ACUTE W/ 1V CHEST
5 series · 5 of 5 positions shown · non-contrast
Comparison: CT 11/28/2011

CLINICAL DATA: Abdominal pain

ACUTE ABDOMEN SERIES (ABDOMEN 2 VIEW & CHEST 1 VIEW)

[t abdomen supine * (1 of 3)]
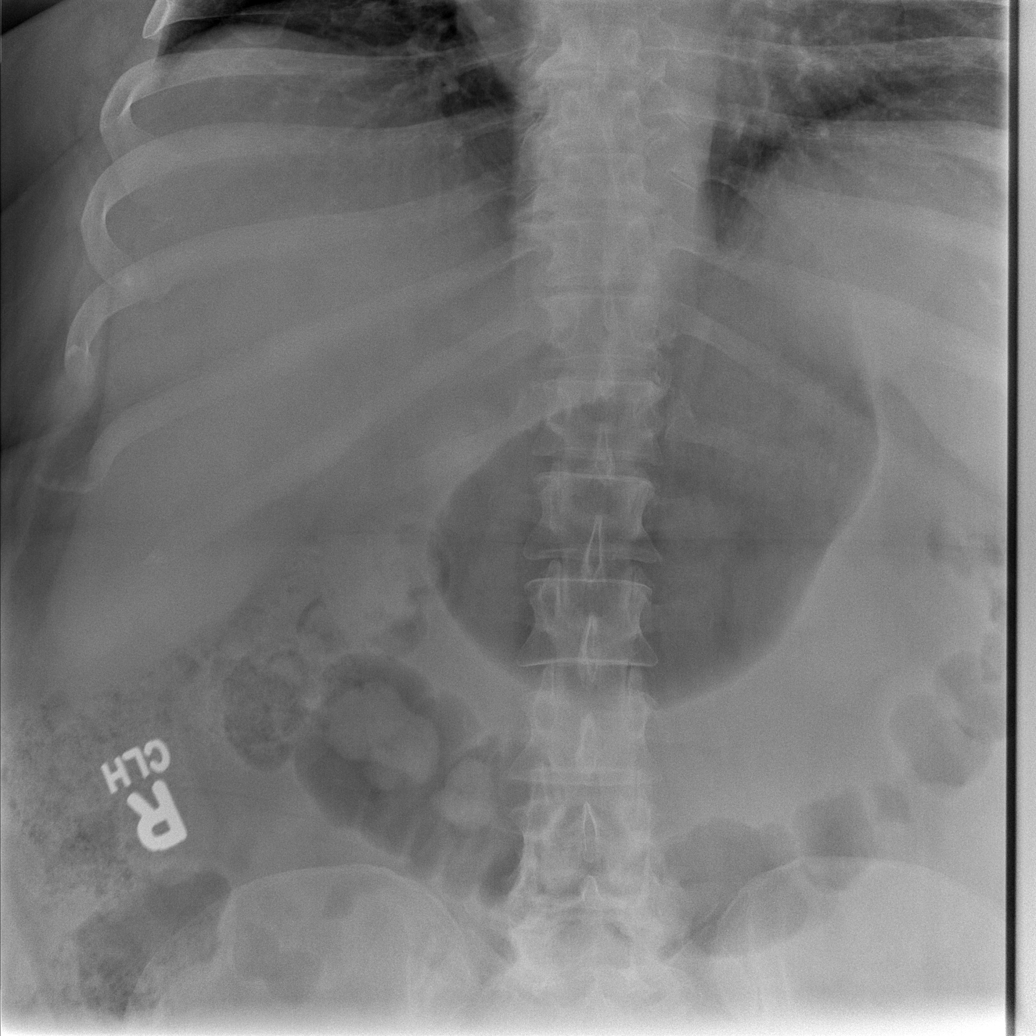

[t abdomen supine * (2 of 3)]
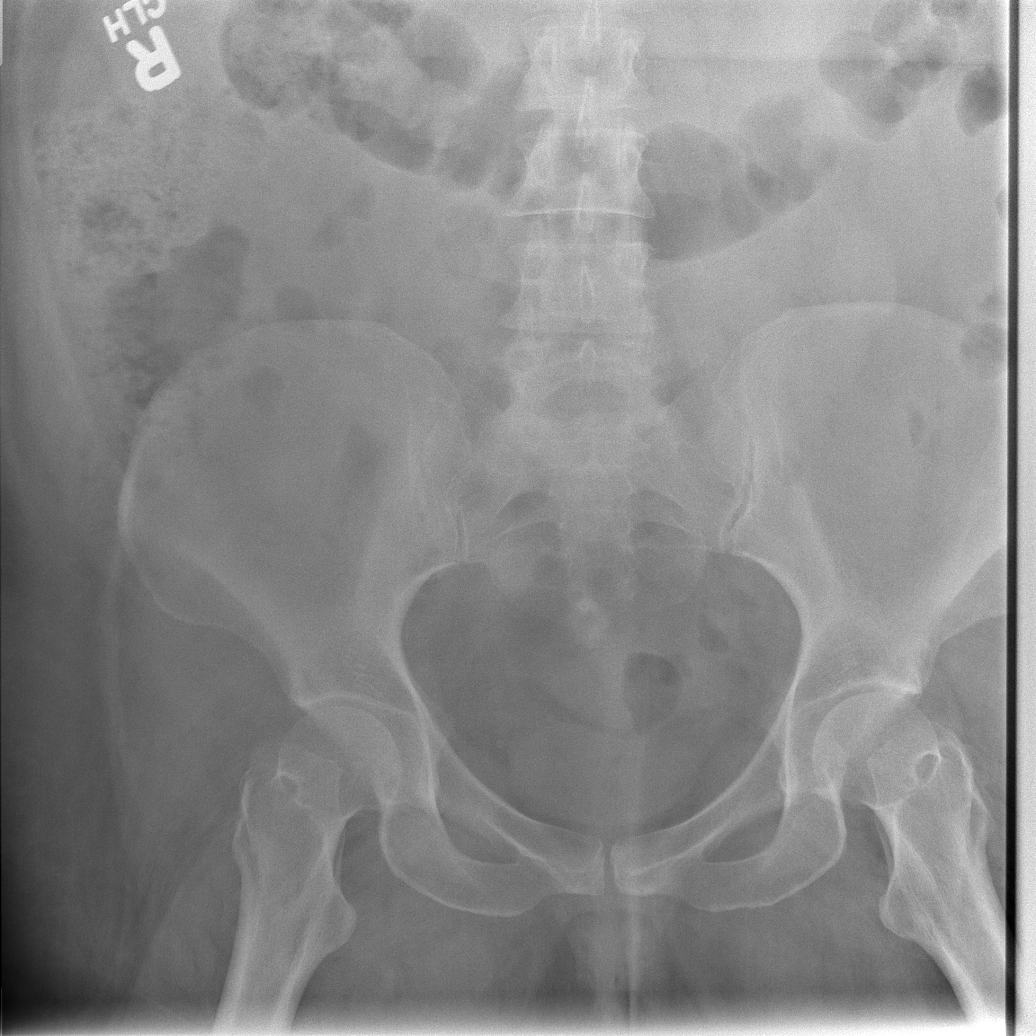

[t abdomen supine * (3 of 3)]
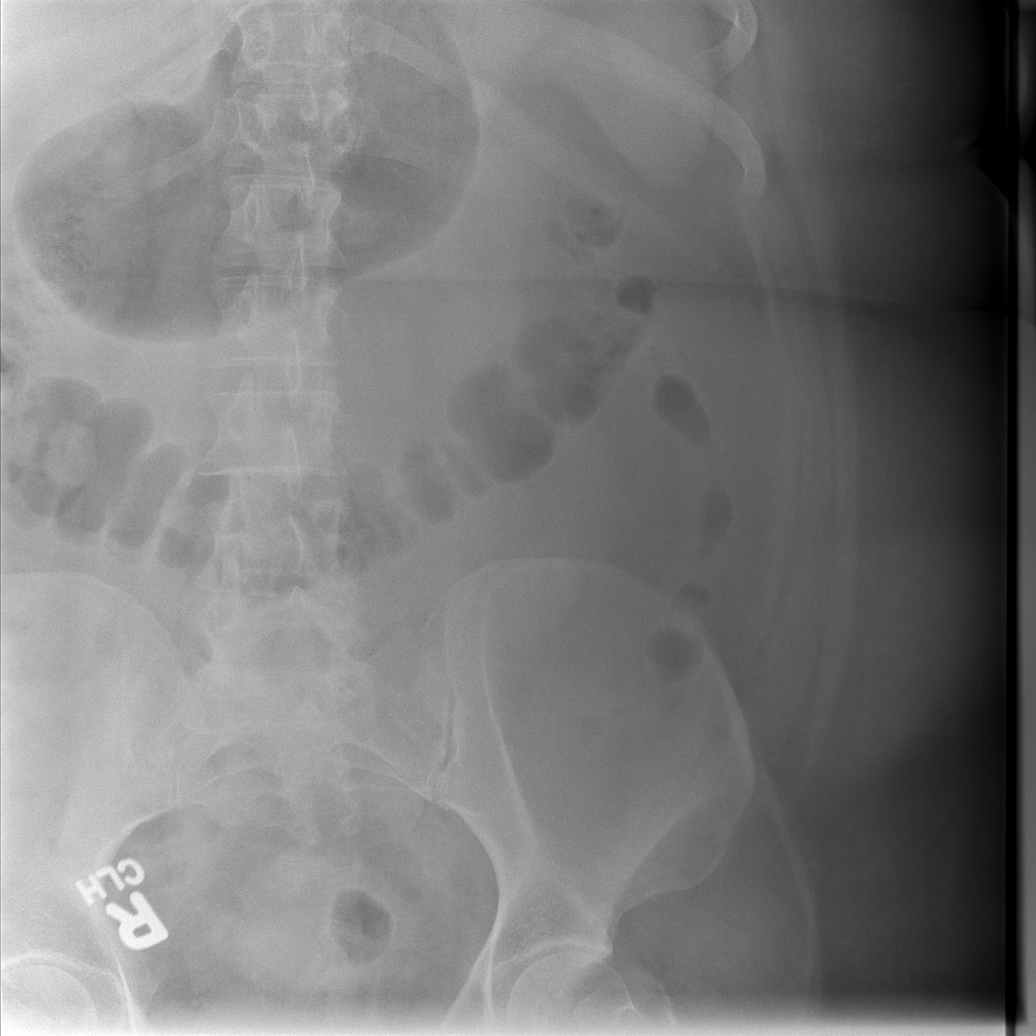

[w abdomen upright *]
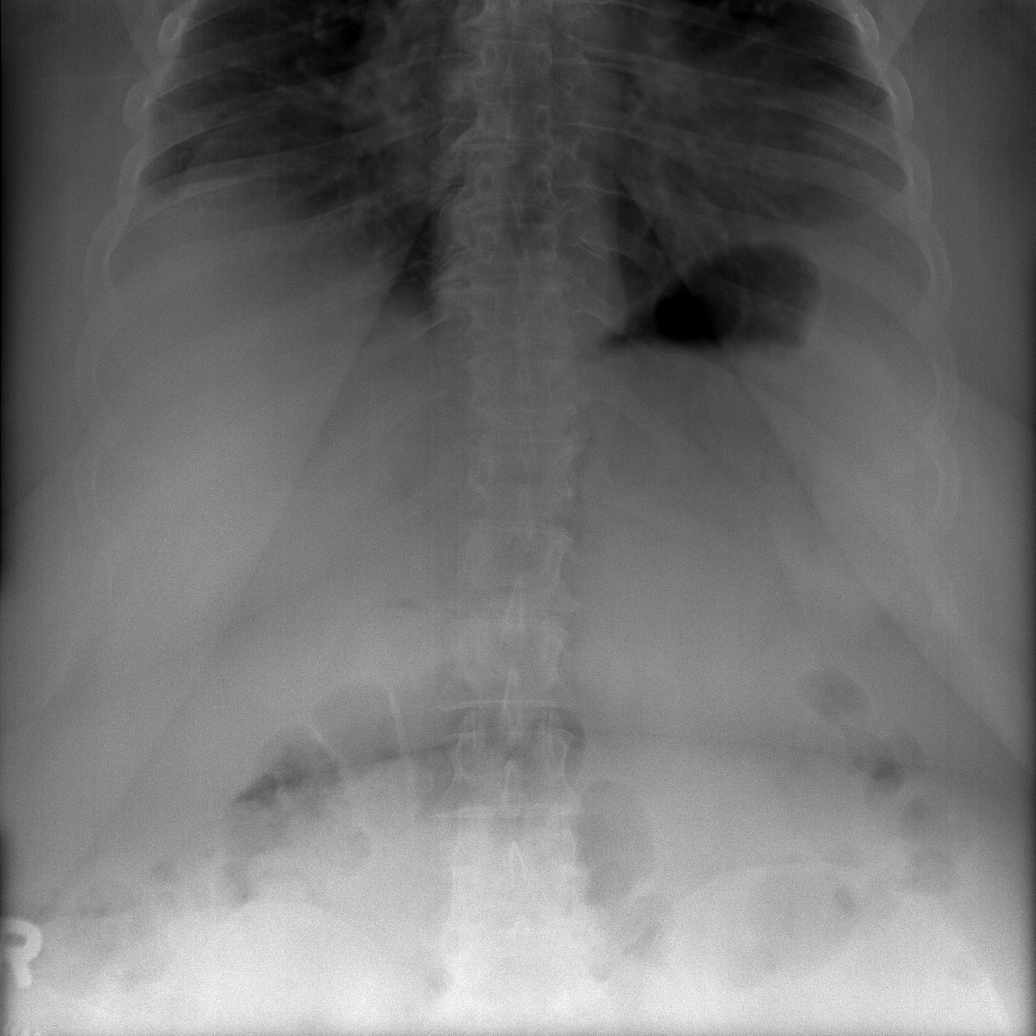

[w chest pa *]
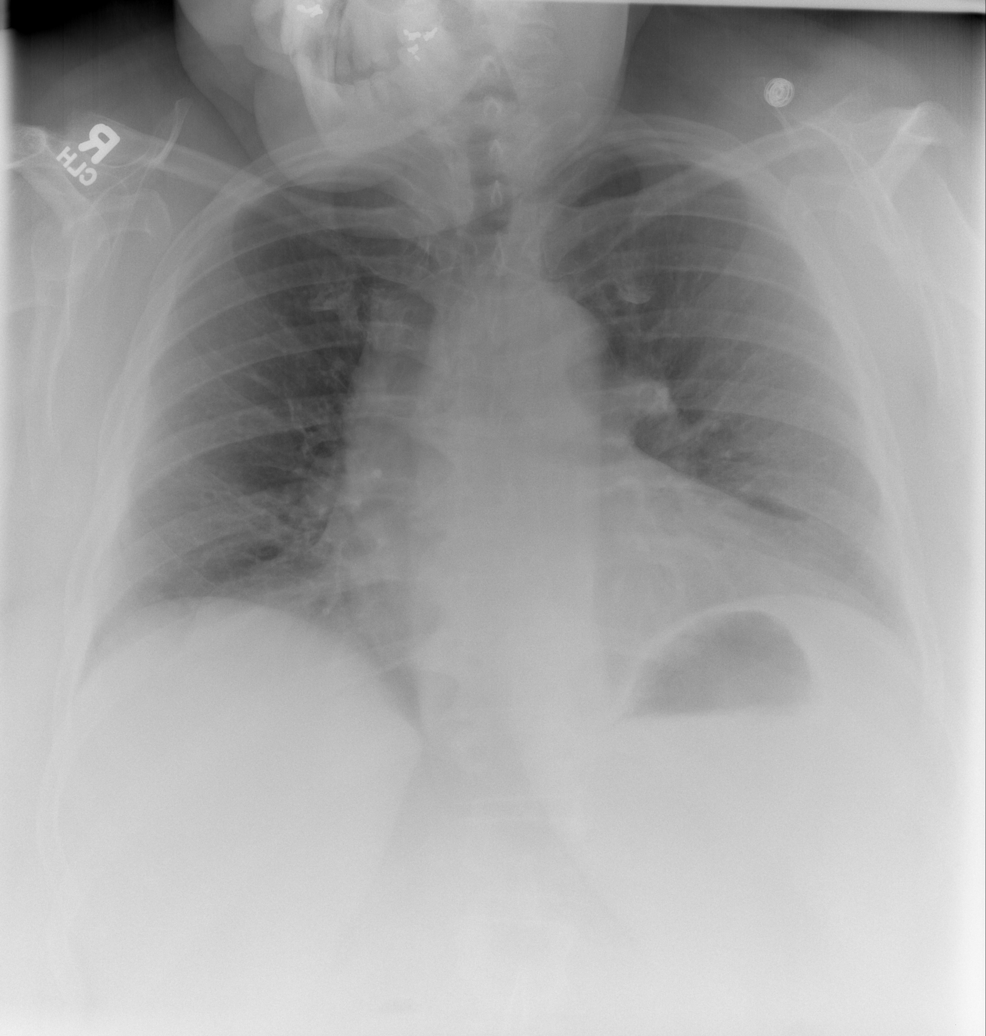

[5 of 5 positions shown; findings below may reference images not displayed]

FINDINGS: Low lung volumes.  No focal infiltrate.  Heart size upper
limits normal.  No effusion.  No free air.  There is mild gaseous
distention of the stomach.  The small bowel is decompressed.
Moderate fecal material in the proximal colon, decompressed
distally.  No abnormal abdominal calcifications.  Regional bones
unremarkable.
IMPRESSION: 1.  Nonobstructive bowel gas pattern.
2.  No free air.

## 2014-07-15 IMAGING — US US RENAL
1 series · 14 of 25 positions shown · non-contrast
Comparison: CT 11/28/2011

CLINICAL DATA: Acute renal failure

RENAL/URINARY TRACT ULTRASOUND COMPLETE

[Series 1: us renal · 0.32mm/px · 14 of 28 slices shown]
[im 1/28]
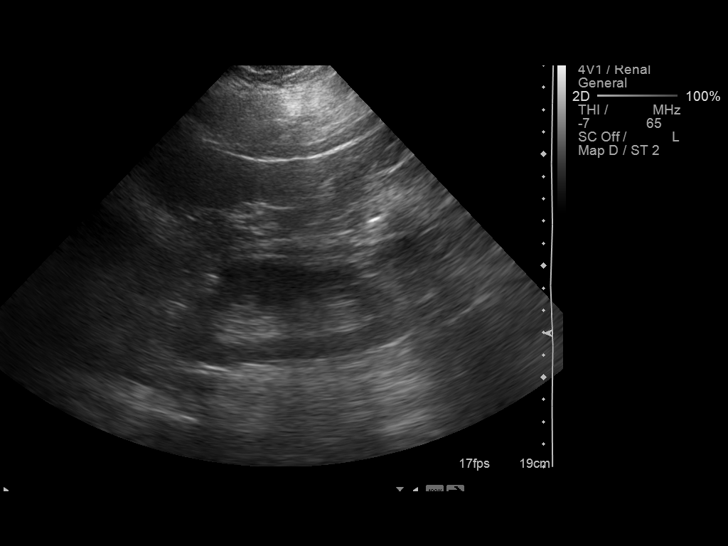
[im 3/28]
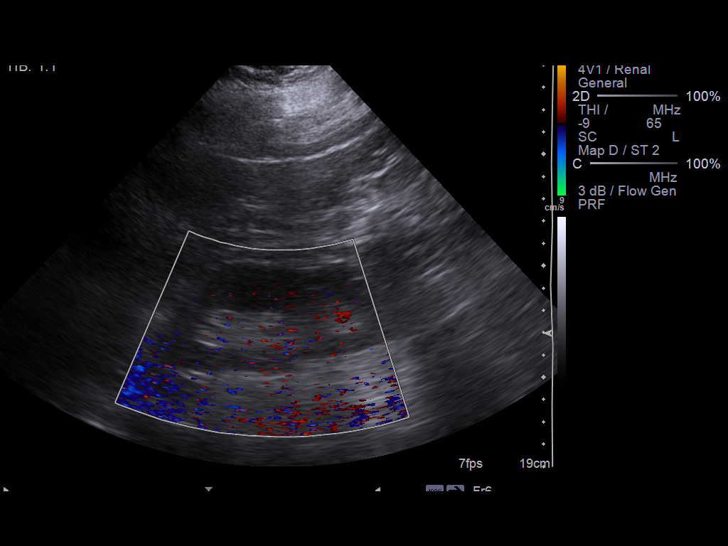
[im 5/28]
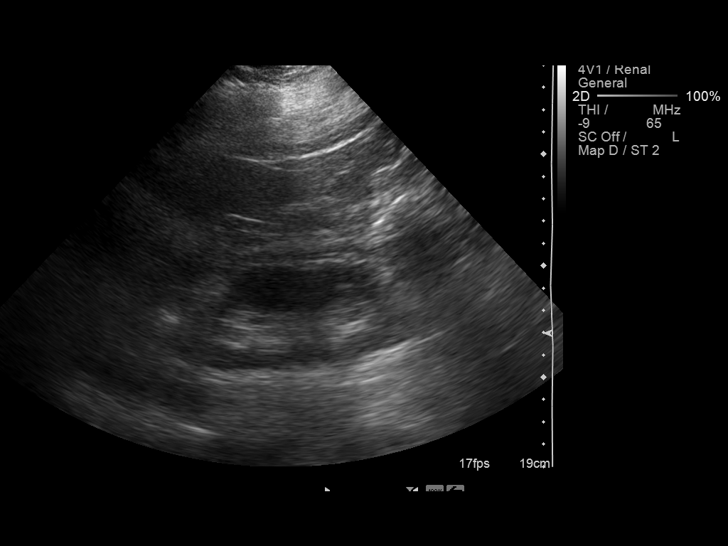
[im 7/28]
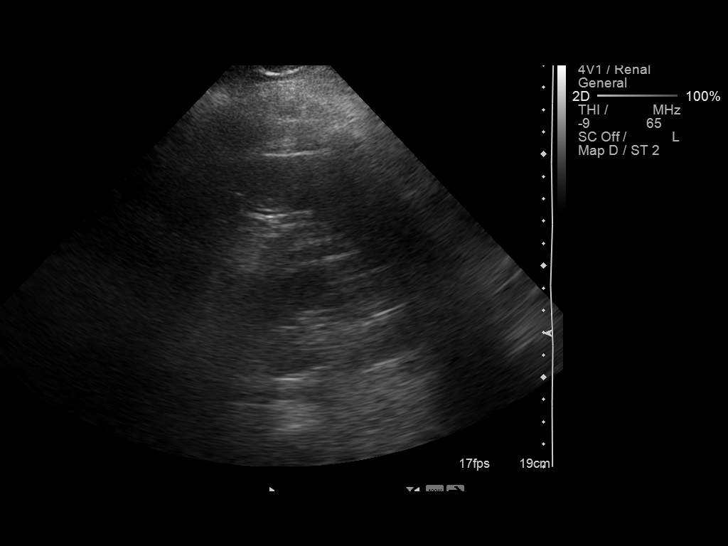
[im 10/28]
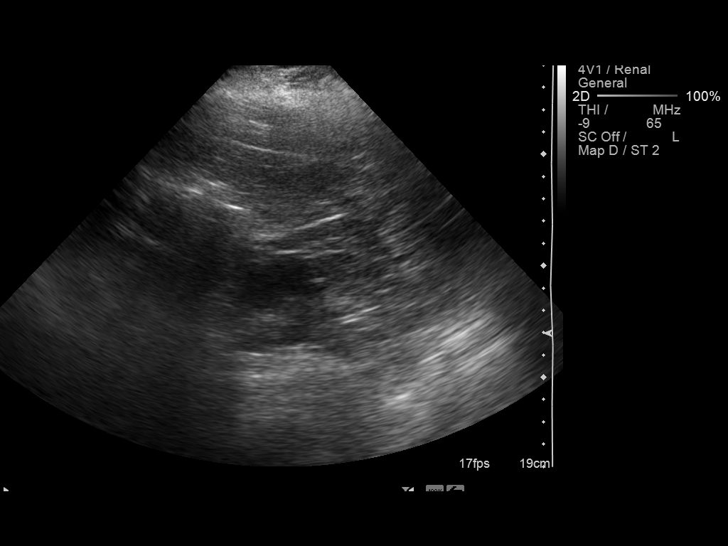
[im 11/28]
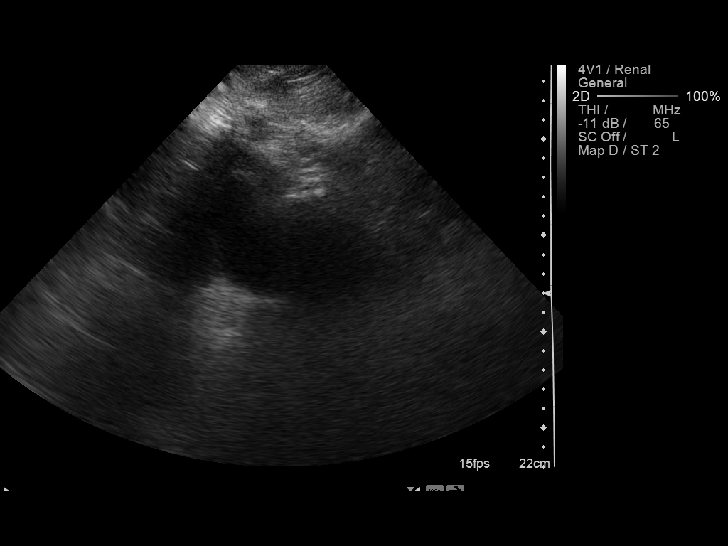
[im 13/28]
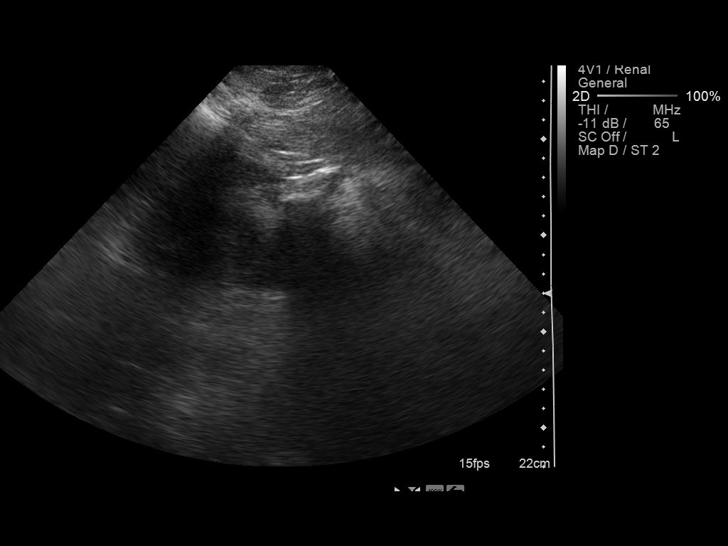
[im 15/28]
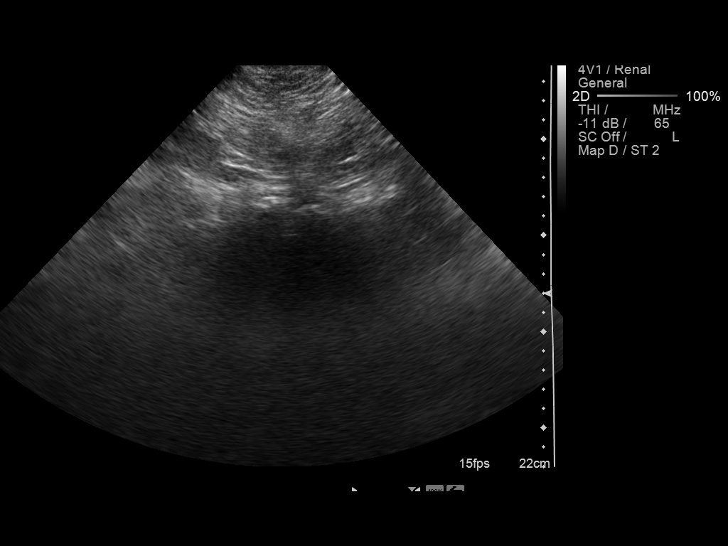
[im 17/28]
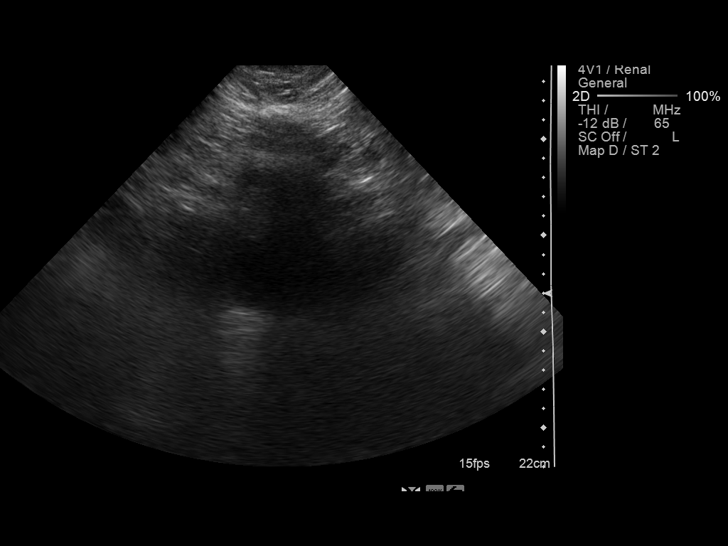
[im 19/28]
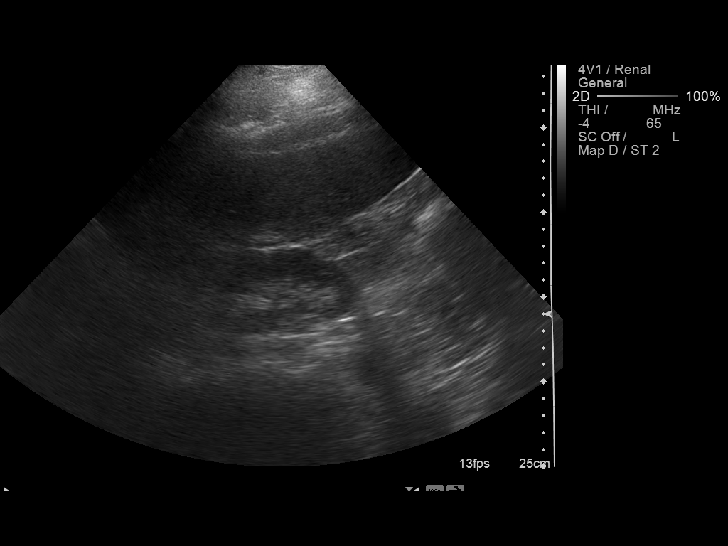
[im 21/28]
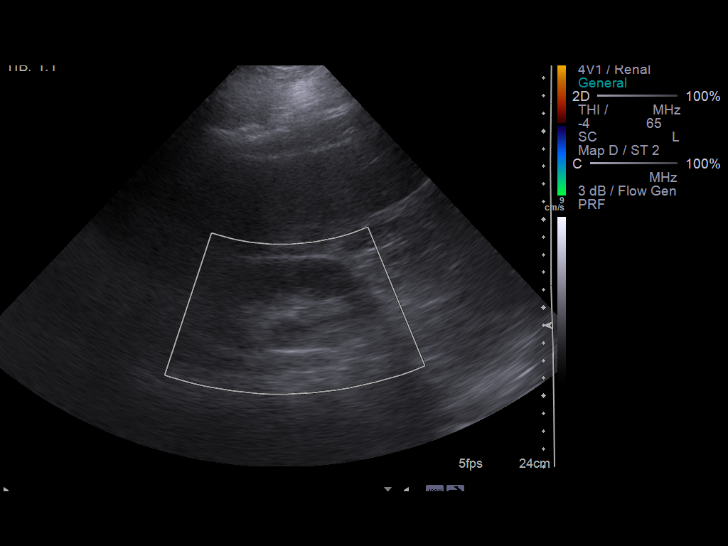
[im 23/28]
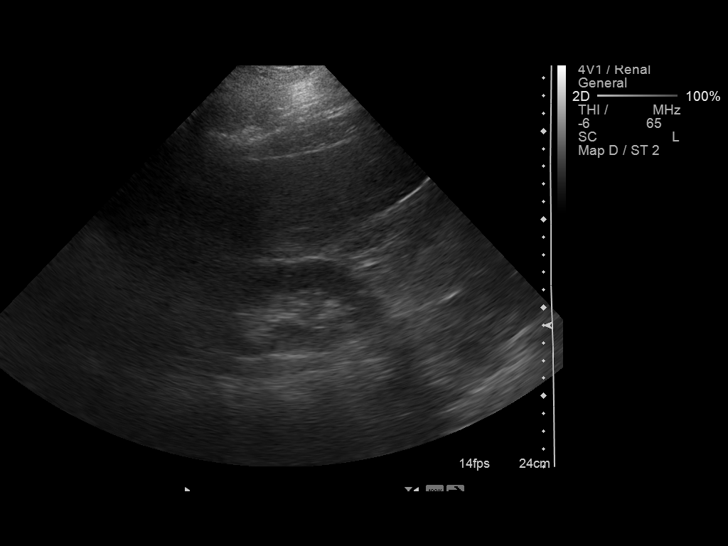
[im 25/28]
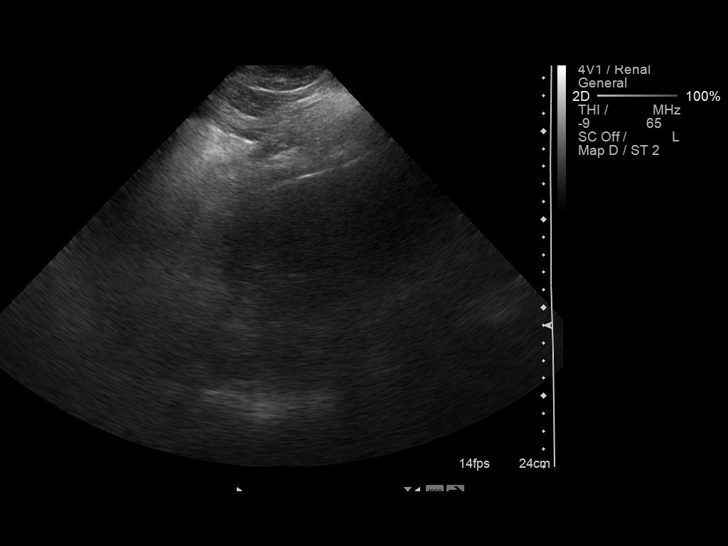
[im 28/28]
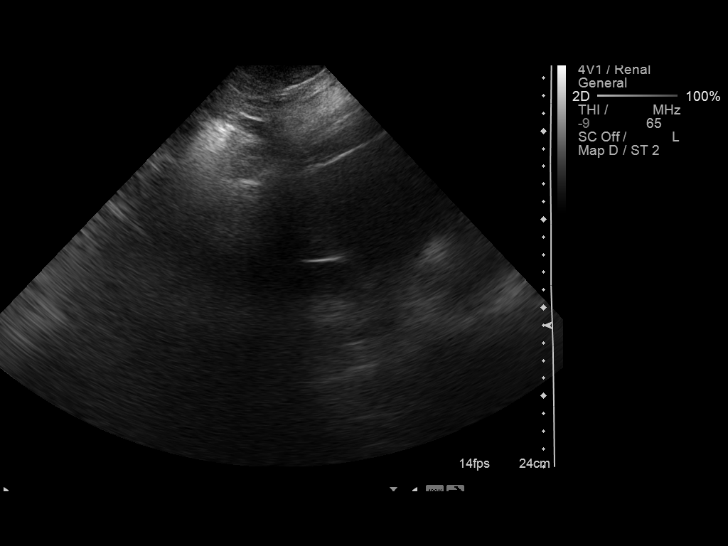

[14 of 25 positions shown; findings below may reference images not displayed]

FINDINGS: Right Kidney:  11 cm. No hydronephrosis.  Well-preserved cortex.
Normal size and parenchymal echotexture without focal
abnormalities.

Left Kidney:  11.1 cm. No hydronephrosis.  Well-preserved cortex.
Normal size and parenchymal echotexture without focal
abnormalities.

Bladder:  Incompletely distended, unremarkable.
IMPRESSION: Negative.  No hydronephrosis.

## 2014-09-22 ENCOUNTER — Encounter (HOSPITAL_COMMUNITY): Payer: Self-pay | Admitting: Emergency Medicine

## 2014-09-22 ENCOUNTER — Emergency Department (HOSPITAL_COMMUNITY): Payer: Medicaid Other

## 2014-09-22 ENCOUNTER — Emergency Department (HOSPITAL_COMMUNITY)
Admission: EM | Admit: 2014-09-22 | Discharge: 2014-09-22 | Disposition: A | Discharge status: Home / Self Care | Payer: Medicaid Other | Attending: Emergency Medicine | Admitting: Emergency Medicine

## 2014-09-22 DIAGNOSIS — M109 Gout, unspecified: Secondary | ICD-10-CM | POA: Insufficient documentation

## 2014-09-22 DIAGNOSIS — R0789 Other chest pain: Secondary | ICD-10-CM | POA: Insufficient documentation

## 2014-09-22 DIAGNOSIS — Z7982 Long term (current) use of aspirin: Secondary | ICD-10-CM | POA: Insufficient documentation

## 2014-09-22 DIAGNOSIS — Z792 Long term (current) use of antibiotics: Secondary | ICD-10-CM | POA: Insufficient documentation

## 2014-09-22 DIAGNOSIS — I10 Essential (primary) hypertension: Secondary | ICD-10-CM | POA: Insufficient documentation

## 2014-09-22 DIAGNOSIS — Z87448 Personal history of other diseases of urinary system: Secondary | ICD-10-CM | POA: Diagnosis not present

## 2014-09-22 DIAGNOSIS — E785 Hyperlipidemia, unspecified: Secondary | ICD-10-CM | POA: Diagnosis not present

## 2014-09-22 DIAGNOSIS — G4733 Obstructive sleep apnea (adult) (pediatric): Secondary | ICD-10-CM | POA: Diagnosis not present

## 2014-09-22 DIAGNOSIS — R062 Wheezing: Secondary | ICD-10-CM | POA: Diagnosis present

## 2014-09-22 DIAGNOSIS — I509 Heart failure, unspecified: Secondary | ICD-10-CM | POA: Insufficient documentation

## 2014-09-22 DIAGNOSIS — Q828 Other specified congenital malformations of skin: Secondary | ICD-10-CM | POA: Diagnosis not present

## 2014-09-22 DIAGNOSIS — Z9981 Dependence on supplemental oxygen: Secondary | ICD-10-CM | POA: Diagnosis not present

## 2014-09-22 DIAGNOSIS — Z880 Allergy status to penicillin: Secondary | ICD-10-CM | POA: Insufficient documentation

## 2014-09-22 DIAGNOSIS — Z872 Personal history of diseases of the skin and subcutaneous tissue: Secondary | ICD-10-CM | POA: Diagnosis not present

## 2014-09-22 DIAGNOSIS — Z79899 Other long term (current) drug therapy: Secondary | ICD-10-CM | POA: Insufficient documentation

## 2014-09-22 DIAGNOSIS — K219 Gastro-esophageal reflux disease without esophagitis: Secondary | ICD-10-CM | POA: Diagnosis not present

## 2014-09-22 DIAGNOSIS — Z8614 Personal history of Methicillin resistant Staphylococcus aureus infection: Secondary | ICD-10-CM | POA: Diagnosis not present

## 2014-09-22 DIAGNOSIS — G8929 Other chronic pain: Secondary | ICD-10-CM | POA: Insufficient documentation

## 2014-09-22 DIAGNOSIS — J45901 Unspecified asthma with (acute) exacerbation: Secondary | ICD-10-CM | POA: Insufficient documentation

## 2014-09-22 DIAGNOSIS — Z87891 Personal history of nicotine dependence: Secondary | ICD-10-CM | POA: Diagnosis not present

## 2014-09-22 DIAGNOSIS — Z590 Homelessness: Secondary | ICD-10-CM | POA: Diagnosis not present

## 2014-09-22 HISTORY — DX: Obstructive sleep apnea (adult) (pediatric): G47.33

## 2014-09-22 HISTORY — DX: Disorder of kidney and ureter, unspecified: N28.9

## 2014-09-22 HISTORY — DX: Dependence on supplemental oxygen: Z99.81

## 2014-09-22 HISTORY — DX: Unspecified abdominal pain: R10.9

## 2014-09-22 HISTORY — DX: Other chronic pain: G89.29

## 2014-09-22 HISTORY — DX: Hidradenitis suppurativa: L73.2

## 2014-09-22 LAB — URINALYSIS, ROUTINE W REFLEX MICROSCOPIC
Bilirubin Urine: NEGATIVE
Glucose, UA: NEGATIVE mg/dL
Hgb urine dipstick: NEGATIVE
Ketones, ur: NEGATIVE mg/dL
Leukocytes, UA: NEGATIVE
NITRITE: NEGATIVE
PH: 5.5 (ref 5.0–8.0)
PROTEIN: NEGATIVE mg/dL
Specific Gravity, Urine: 1.017 (ref 1.005–1.030)
UROBILINOGEN UA: 0.2 mg/dL (ref 0.0–1.0)

## 2014-09-22 LAB — COMPREHENSIVE METABOLIC PANEL
ALK PHOS: 58 U/L (ref 38–126)
ALT: 13 U/L — AB (ref 14–54)
AST: 16 U/L (ref 15–41)
Albumin: 3.3 g/dL — ABNORMAL LOW (ref 3.5–5.0)
Anion gap: 7 (ref 5–15)
BUN: 18 mg/dL (ref 6–20)
CO2: 32 mmol/L (ref 22–32)
Calcium: 7.8 mg/dL — ABNORMAL LOW (ref 8.9–10.3)
Chloride: 103 mmol/L (ref 101–111)
Creatinine, Ser: 1.22 mg/dL — ABNORMAL HIGH (ref 0.44–1.00)
GFR calc Af Amer: 59 mL/min — ABNORMAL LOW (ref >60)
GFR, EST NON AFRICAN AMERICAN: 51 mL/min — AB (ref >60)
Glucose, Bld: 106 mg/dL — ABNORMAL HIGH (ref 65–99)
POTASSIUM: 4.2 mmol/L (ref 3.5–5.1)
Sodium: 142 mmol/L (ref 135–145)
Total Bilirubin: 0.2 mg/dL — ABNORMAL LOW (ref 0.3–1.2)
Total Protein: 6.5 g/dL (ref 6.5–8.1)

## 2014-09-22 LAB — CBC WITH DIFFERENTIAL/PLATELET
BASOS ABS: 0 10*3/uL (ref 0.0–0.1)
Basophils Relative: 0 % (ref 0–1)
Eosinophils Absolute: 0.3 10*3/uL (ref 0.0–0.7)
Eosinophils Relative: 3 % (ref 0–5)
HCT: 40.5 % (ref 36.0–46.0)
HEMOGLOBIN: 12 g/dL (ref 12.0–15.0)
Lymphocytes Relative: 23 % (ref 12–46)
Lymphs Abs: 1.8 10*3/uL (ref 0.7–4.0)
MCH: 28.3 pg (ref 26.0–34.0)
MCHC: 29.6 g/dL — AB (ref 30.0–36.0)
MCV: 95.5 fL (ref 78.0–100.0)
Monocytes Absolute: 0.4 10*3/uL (ref 0.1–1.0)
Monocytes Relative: 5 % (ref 3–12)
NEUTROS ABS: 5.2 10*3/uL (ref 1.7–7.7)
NEUTROS PCT: 69 % (ref 43–77)
Platelets: 188 10*3/uL (ref 150–400)
RBC: 4.24 MIL/uL (ref 3.87–5.11)
RDW: 15.1 % (ref 11.5–15.5)
WBC: 7.7 10*3/uL (ref 4.0–10.5)

## 2014-09-22 LAB — I-STAT TROPONIN, ED: Troponin i, poc: 0 ng/mL (ref 0.00–0.08)

## 2014-09-22 LAB — BRAIN NATRIURETIC PEPTIDE: B Natriuretic Peptide: 106.9 pg/mL — ABNORMAL HIGH (ref 0.0–100.0)

## 2014-09-22 LAB — I-STAT CG4 LACTIC ACID, ED: LACTIC ACID, VENOUS: 0.88 mmol/L (ref 0.5–2.0)

## 2014-09-22 MED ORDER — ALBUTEROL SULFATE (2.5 MG/3ML) 0.083% IN NEBU: 2.5000 mg | INHALATION_SOLUTION | RESPIRATORY_TRACT | Status: DC | PRN

## 2014-09-22 MED ORDER — ALBUTEROL SULFATE HFA 108 (90 BASE) MCG/ACT IN AERS: 2.0000 | INHALATION_SPRAY | RESPIRATORY_TRACT | Status: DC | PRN

## 2014-09-22 MED ORDER — IPRATROPIUM-ALBUTEROL 0.5-2.5 (3) MG/3ML IN SOLN
3.0000 mL | Freq: Once | RESPIRATORY_TRACT | Status: AC
  Administered 2014-09-22: 3 mL via RESPIRATORY_TRACT
  Filled 2014-09-22: qty 3

## 2014-09-22 MED ORDER — PREDNISONE 20 MG PO TABS: 40.0000 mg | ORAL_TABLET | Freq: Every day | ORAL | Status: DC

## 2014-09-22 NOTE — ED Notes (Signed)
[facility] here to transport pt back to home.

## 2014-09-22 NOTE — ED Notes (Signed)
Per EMS pt found on couch alert with no respiratory distress, c/o upper chest pain, leg pain, and right side pain x 4 days, 90% O2 on room air. Per EMS, pt slept in ambulance on the way to ED.

## 2014-09-22 NOTE — ED Provider Notes (Signed)
CSN: [id number]     Arrival date & time 09/22/14  1510 History   First MD Initiated Contact with Patient 09/22/14 1546     Chief Complaint  Patient presents with  . Wheezing  . Chest Pain    right upper chest wall      HPI Pt was seen at 1150. Per pt, c/o gradual onset and persistence of constant right sided chest wall "pain," SOB, wheezing and increasing peripheral edema for the past 4 days. Pt states she "tried to use" her nebulizer without improvement. EMS states they found pt laying on the couch without her continuous O2 N/C on, Sats 90% R/A. EMS states pt "slept en route to the ED."  Pt also c/o acute flair of her chronic chronic pain "all over" for the past several days. Pt states she was evaluated by her PMD today for this complaint and rx her usual hydrocodone/APAP.  Denies calf/LE pain or unilateral swelling, no abd pain, no N/V/D, no cough, no fevers, no rash.     Past Medical History  Diagnosis Date  . Asthma   . GERD (gastroesophageal reflux disease)   . Hypertension   . Homelessness   . Low back pain   . 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 Arthritis   . Gout   . Hyperlipemia   . Morbid obesity     uses motor wheel chair  . MRSA (methicillin resistant Staphylococcus aureus)     states about a year ago  . CHF (congestive heart failure)   . Chronic abdominal pain   . Hidradenitis   . Chronic pain     "all over"  . Renal insufficiency   . On home O2     3L N/C O2 continuously  . OSA (obstructive sleep apnea)     non-compliant with CPAP   Past Surgical History  Procedure Laterality Date  . Tubal ligation    . Cesarean section     Family History  Problem Relation Age of Onset  . Asthma Mother   . Diabetes Father   . Heart disease Father   . Stroke Father    History  Substance Use Topics  . Smoking status: Former Smoker    Types: Cigarettes    Quit date: 03/25/2003  . Smokeless tobacco: Former Systems developer    Quit date: 05/27/1978  . Alcohol  Use: No    Review of Systems ROS: Statement: All systems negative except as marked or noted in the HPI; Constitutional: Negative for fever and chills. ; ; Eyes: Negative for eye pain, redness and discharge. ; ; ENMT: Negative for ear pain, hoarseness, nasal congestion, sinus pressure and sore throat. ; ; Cardiovascular: Negative for palpitations, diaphoresis, +peripheral edema. ; ; Respiratory: +SOB, wheezing. Negative for cough and stridor. ; ; Gastrointestinal: Negative for nausea, vomiting, diarrhea, abdominal pain, blood in stool, hematemesis, jaundice and rectal bleeding. . ; ; Genitourinary: Negative for dysuria, flank pain and hematuria. ; ; Musculoskeletal: +CP. Negative for back pain and neck pain. Negative for swelling and trauma.; ; Skin: Negative for pruritus, rash, abrasions, blisters, bruising and skin lesion.; ; Neuro: Negative for headache, lightheadedness and neck stiffness. Negative for weakness, altered level of consciousness , altered mental status, extremity weakness, paresthesias, involuntary movement, seizure and syncope.      Allergies  Ibuprofen; Adhesive; Azithromycin; Doxycycline; Penicillins; Sulfa antibiotics; and Sulfamethoxazole-trimethoprim  Home Medications   Prior to Admission medications   Medication Sig Start Date End  Date Taking? Authorizing Provider  albuterol (PROVENTIL HFA) 108 (90 BASE) MCG/ACT inhaler Inhale 2 puffs into the lungs every 4 (four) hours as needed for wheezing or shortness of breath. 01/22/14   Donne Hazel, MD  albuterol (PROVENTIL) (2.5 MG/3ML) 0.083% nebulizer solution Take 2.5 mg by nebulization every 6 (six) hours as needed for wheezing or shortness of breath (wheezing).     Historical Provider, MD  allopurinol (ZYLOPRIM) 100 MG tablet Take 1 tablet (100 mg total) by mouth every morning. 01/22/14   Donne Hazel, MD  ALPRAZolam Duanne Moron) 1 MG tablet Take 1 mg by mouth at bedtime as needed for anxiety (anxiety).    Historical Provider, MD   ammonium lactate (AMLACTIN) 12 % cream Apply 1 g topically daily as needed for dry skin. 04/22/14   Linton Flemings, MD  aspirin 325 MG tablet Take 325 mg by mouth every morning.     Historical Provider, MD  atorvastatin (LIPITOR) 20 MG tablet Take 1 tablet (20 mg total) by mouth every morning. 01/22/14   Donne Hazel, MD  cephALEXin (KEFLEX) 500 MG capsule Take 1 capsule (500 mg total) by mouth 4 (four) times daily. 06/13/14   Tatyana Kirichenko, PA-C  clindamycin (CLEOCIN) 150 MG capsule Take 1 capsule (150 mg total) by mouth 3 (three) times daily. Patient not taking: Reported on 06/19/2014 05/18/14   Tanna Furry, MD  clotrimazole (LOTRIMIN) 1 % cream Apply to affected area 2 times daily 06/20/14   Charlann Lange, PA-C  diphenhydrAMINE (BENADRYL) 25 mg capsule Take 25 mg by mouth every 6 (six) hours as needed for itching or allergies (allergies).     Historical Provider, MD  Emollient (EUCERIN) lotion Apply 5 mLs topically 2 (two) times daily as needed for dry skin. Patient taking differently: Apply 5 mLs topically 3 (three) times daily.  04/22/14   Linton Flemings, MD  folic acid (FOLVITE) 1 MG tablet Take 1 tablet (1 mg total) by mouth daily. 01/22/14   Donne Hazel, MD  furosemide (LASIX) 40 MG tablet Take 1 tablet (40 mg total) by mouth 2 (two) times daily. Patient not taking: Reported on 06/19/2014 01/22/14   Donne Hazel, MD  guaiFENesin (MUCINEX) 600 MG 12 hr tablet Take 600 mg by mouth 2 (two) times daily.    Historical Provider, MD  HYDROcodone-acetaminophen (NORCO/VICODIN) 5-325 MG per tablet Take 2 tablets by mouth every 4 (four) hours as needed. 05/18/14   Tanna Furry, MD  hydrOXYzine (ATARAX/VISTARIL) 25 MG tablet Take 25 mg by mouth 3 (three) times daily as needed for anxiety or itching (anxiety and itching).     Historical Provider, MD  ketoconazole (NIZORAL) 2 % shampoo Apply 1 application topically daily as needed for irritation (irritation).     Historical Provider, MD  ketoconazole (NIZORAL)  200 MG tablet Take 200 mg by mouth every morning.     Historical Provider, MD  ketorolac (TORADOL) 10 MG tablet Take 1 tablet (10 mg total) by mouth every 6 (six) hours as needed for severe pain. Patient not taking: Reported on 06/19/2014 01/23/14   Donne Hazel, MD  Multiple Vitamin (MULTIVITAMIN WITH MINERALS) TABS tablet Take 1 tablet by mouth daily. 01/22/14   Donne Hazel, MD  pantoprazole (PROTONIX) 40 MG tablet Take 1 tablet (40 mg total) by mouth every morning. 01/22/14   Donne Hazel, MD  predniSONE (DELTASONE) 10 MG tablet Take 5 tab day 1, take 4 tab day 2, take 3 tab day 3, take 2  tab day 4, and take 1 tab day 5 Patient not taking: Reported on 06/19/2014 06/13/14   Jeannett Senior, PA-C  solifenacin (VESICARE) 5 MG tablet Take 5 mg by mouth every morning.     Historical Provider, MD  traMADol (ULTRAM) 50 MG tablet Take 1 tablet (50 mg total) by mouth every 6 (six) hours as needed. 06/20/14   Charlann Lange, PA-C  vitamin B-12 (CYANOCOBALAMIN) 1000 MCG tablet Take 1 tablet (1,000 mcg total) by mouth every morning. 01/22/14   Donne Hazel, MD  vitamin E 100 UNIT capsule Take 1 capsule (100 Units total) by mouth every morning. 01/22/14   Donne Hazel, MD   BP 102/74 mmHg  Pulse 68  Temp(Src) 98.2 F (36.8 C) (Oral)  Resp 20  SpO2 93%  LMP 03/24/2009   16:35:58 Orthostatic Vital Signs ND  Orthostatic Lying  - BP- Lying: 106/63 mmHg ; Pulse- Lying: 68  Orthostatic Sitting - BP- Sitting: 120/73 mmHg ; Pulse- Sitting: 87  Orthostatic Standing at 0 minutes - BP- Standing at 0 minutes: 128/64 mmHg ; Pulse- Standing at 0 minutes: 80      Physical Exam  1555: Physical examination:  Nursing notes reviewed; Vital signs and O2 SAT reviewed;  Constitutional: Well developed, Well nourished, Well hydrated, In no acute distress; Head:  Normocephalic, atraumatic; Eyes: EOMI, PERRL, No scleral icterus; ENMT: Mouth and pharynx normal, Mucous membranes moist; Neck: Supple, Full range of motion,  No lymphadenopathy; Cardiovascular: Regular rate and rhythm, No gallop; Respiratory: Breath sounds diminished & equal bilaterally, faint exp wheezes. No audible wheezing. Speaking full sentences with ease, Normal respiratory effort/excursion; Chest: +right upper chest wall tenderness to palp. No rash, no deformity, no soft tissue crepitus. Movement normal; Abdomen: Soft, Nontender, Nondistended, Normal bowel sounds; Genitourinary: No CVA tenderness; Extremities: Pulses normal, No tenderness, +1 pedal edema bilat without calf asymmetry.; Neuro: AA&Ox3, Major CN grossly intact.  Speech clear. No gross focal motor or sensory deficits in extremities.; Skin: Color normal, Warm, Dry.   ED Course  Procedures      EKG Interpretation   Date/Time:  Friday September 22 2014 15:37:08 EDT Ventricular Rate:  68 PR Interval:  146 QRS Duration: 103 QT Interval:  431 QTC Calculation: 458 R Axis:   43 Text Interpretation:  Sinus rhythm Low voltage, precordial leads When  compared with ECG of 07/08/2014 No significant change was found Confirmed  by [facility]  MD, Nunzio Cory [phone]) on 09/22/2014 4:31:37 PM      MDM  MDM Reviewed: previous chart, nursing note and vitals Reviewed previous: labs and ECG Interpretation: labs, ECG and x-ray     Results for orders placed or performed during the hospital encounter of 09/22/14  Comprehensive metabolic panel  Result Value Ref Range   Sodium 142 135 - 145 mmol/L   Potassium 4.2 3.5 - 5.1 mmol/L   Chloride 103 101 - 111 mmol/L   CO2 32 22 - 32 mmol/L   Glucose, Bld 106 (H) 65 - 99 mg/dL   BUN 18 6 - 20 mg/dL   Creatinine, Ser 1.22 (H) 0.44 - 1.00 mg/dL   Calcium 7.8 (L) 8.9 - 10.3 mg/dL   Total Protein 6.5 6.5 - 8.1 g/dL   Albumin 3.3 (L) 3.5 - 5.0 g/dL   AST 16 15 - 41 U/L   ALT 13 (L) 14 - 54 U/L   Alkaline Phosphatase 58 38 - 126 U/L   Total Bilirubin 0.2 (L) 0.3 - 1.2 mg/dL   GFR calc non  Af Amer 51 (L) >60 mL/min   GFR calc Af Amer 59 (L) >60 mL/min    Anion gap 7 5 - 15  Brain natriuretic peptide  Result Value Ref Range   B Natriuretic Peptide 106.9 (H) 0.0 - 100.0 pg/mL  CBC with Differential  Result Value Ref Range   WBC 7.7 4.0 - 10.5 K/uL   RBC 4.24 3.87 - 5.11 MIL/uL   Hemoglobin 12.0 12.0 - 15.0 g/dL   HCT 40.5 36.0 - 46.0 %   MCV 95.5 78.0 - 100.0 fL   MCH 28.3 26.0 - 34.0 pg   MCHC 29.6 (L) 30.0 - 36.0 g/dL   RDW 15.1 11.5 - 15.5 %   Platelets 188 150 - 400 K/uL   Neutrophils Relative % 69 43 - 77 %   Neutro Abs 5.2 1.7 - 7.7 K/uL   Lymphocytes Relative 23 12 - 46 %   Lymphs Abs 1.8 0.7 - 4.0 K/uL   Monocytes Relative 5 3 - 12 %   Monocytes Absolute 0.4 0.1 - 1.0 K/uL   Eosinophils Relative 3 0 - 5 %   Eosinophils Absolute 0.3 0.0 - 0.7 K/uL   Basophils Relative 0 0 - 1 %   Basophils Absolute 0.0 0.0 - 0.1 K/uL  Urinalysis, Routine w reflex microscopic (not at [facility])  Result Value Ref Range   Color, Urine YELLOW YELLOW   APPearance CLOUDY (A) CLEAR   Specific Gravity, Urine 1.017 1.005 - 1.030   pH 5.5 5.0 - 8.0   Glucose, UA NEGATIVE NEGATIVE mg/dL   Hgb urine dipstick NEGATIVE NEGATIVE   Bilirubin Urine NEGATIVE NEGATIVE   Ketones, ur NEGATIVE NEGATIVE mg/dL   Protein, ur NEGATIVE NEGATIVE mg/dL   Urobilinogen, UA 0.2 0.0 - 1.0 mg/dL   Nitrite NEGATIVE NEGATIVE   Leukocytes, UA NEGATIVE NEGATIVE  I-stat troponin, ED  Result Value Ref Range   Troponin i, poc 0.00 0.00 - 0.08 ng/mL   Comment 3          I-Stat CG4 Lactic Acid, ED  Result Value Ref Range   Lactic Acid, Venous 0.88 0.5 - 2.0 mmol/L   Dg Chest 2 View 09/22/2014   CLINICAL DATA:  Shortness of breath.  EXAM: CHEST  2 VIEW  COMPARISON:  July 08, 2014.  FINDINGS: Mild cardiomegaly is noted. No pneumothorax or pleural effusion is noted. Both lungs are clear. The visualized skeletal structures are unremarkable.  IMPRESSION: No active cardiopulmonary disease.   Electronically Signed   By: Marijo Conception, M.D.   On: 09/22/2014 17:22    2000:  Labs  today are better than pt's baseline.  Doubt PE as cause for symptoms with low risk [place].  Doubt ACS as cause for symptoms with normal troponin and unchanged EKG from previous after 4 days of constant symptoms.  Pt will lay on the bed on her side with the covers up, and not cooperate with her care/talk with staff, then her cellphone will ring and she will talk and act normally. Pt refuses ABG. States she is "going home now." Lungs CTA bilat after neb treatment. Pt now states she "doesn't have any more solution" for her neb machine and "needs a refill of my inhaler." Pt is also asking for "a prescription for percocet."  Pt informed that the ED was not the proper venue for refills of her chronic narcotic pain meds. Pt also reminded she told me earlier that she saw her PMD today for her chronic pain meds. Pt  then stated, "oh yeah" and "never mind then." Pt has now started to pull off her monitor leads and throw her pulse ox monitor.  Dx and testing d/w pt.  Questions answered.  Verb understanding, agreeable to d/c home with outpt f/u.   Francine Graven, DO 09/25/14 [phone]

## 2014-09-22 NOTE — Discharge Instructions (Signed)
°Emergency Department Resource Guide °1) Find a Doctor and Pay Out of Pocket °Although you won't have to find out who is covered by your insurance plan, it is a good idea to ask around and get recommendations. You will then need to call the office and see if the doctor you have chosen will accept you as a new patient and what types of options they offer for patients who are self-pay. Some doctors offer discounts or will set up payment plans for their patients who do not have insurance, but you will need to ask so you aren't surprised when you get to your appointment. ° °2) Contact Your Local Health Department °Not all health departments have doctors that can see patients for sick visits, but many do, so it is worth a call to see if yours does. If you don't know where your local health department is, you can check in your phone book. The CDC also has a tool to help you locate your state's health department, and many state websites also have listings of all of their local health departments. ° °3) Find a Walk-in Clinic °If your illness is not likely to be very severe or complicated, you may want to try a walk in clinic. These are popping up all over the country in pharmacies, drugstores, and shopping centers. They're usually staffed by nurse practitioners or physician assistants that have been trained to treat common illnesses and complaints. They're usually fairly quick and inexpensive. However, if you have serious medical issues or chronic medical problems, these are probably not your best option. ° °No Primary Care Doctor: °- Call [facility] at  832-8000 - they can help you locate a primary care doctor that  accepts your insurance, provides certain services, etc. °- Physician Referral Service- 1-800-533-3463 ° °Chronic Pain Problems: °Organization         Address  Phone   Notes  °[facility]  (336) [phone] Patients need to be referred by their primary care doctor.  ° °Medication  Assistance: °Organization         Address  Phone   Notes  °[place] [facility] [address] °[place], [place] [address] (336) 641-8030 --Must be a resident of [place] °-- Must have NO insurance coverage whatsoever (no Medicaid/ Medicare, etc.) °-- The pt. MUST have a primary care doctor that directs their care regularly and follows them in the community °  °[facility]  (866) 331-1348   °[employer]  (888) 892-1162   ° °Agencies that provide inexpensive medical care: °Organization         Address  Phone   Notes  °[facility]  (336) 832-8035   °[facility]    (336) 832-7272   °[facility] [address] °[place], [place] [address] (336) 832-4777   °[facility] [address], °[place] (336) 271-4999   °[facility]    (336) 373-0678   °[facility]    (336) 272-1050   °[facility] ° [address],  Phone:  (336) 832-4444, Fax:  (336) 832-4440 Hours of Operation:  9 am - 6 pm, M-F.  Also accepts Medicaid/Medicare and self-pay.  °[facility] ° [address],  Phone: (336) 832-3150, Fax: (336) 832-3151. Hours of Operation:  8:30 am - 5:30 pm, M-F.  Also accepts Medicaid and self-pay.  °[facility] [place] [address], [place] Phone: (336) 878-6027   °[facility] [address], [place], [place] (336)723-1848, Ext. 123 Mondays & Thursdays: 7-9 AM.  First 15 patients are seen on a first come, first serve basis. °  ° °Medicaid-accepting [place] Providers: ° °Organization         Address  Phone   Notes  °[facility] [address], [place] (336) 641-2100 Also accepts self-pay patients.  °[facility] [address], [place] ° (336) 856-9996   °[facility] [address], [place]  (336) 288-8857   °[facility] [address] [place] [address], [place] (336) 299-7000   °Veita Bland [address], [place]  ° (336) 373-1557 Only accepts [place] Access Medicaid patients after they have their name applied to their card.  ° °Self-Pay (no insurance) in [place]: ° °Organization         Address  Phone   Notes  °Sickle Cell Patients, [facility] [address], [place] (336) 832-1970   °[facility] [address], [place] (336) 832-4400   °[facility] ° [address], [place] (336) 992-4800   °[facility]/Dr. Osei-Bonsu ° [address] [place] [address], [place] or [address], [place] (336) [phone] Phone number for both [place] and [place] locations is the same.  °[facility] [address], [place] (336) 299-0000   °[facility] [address] [place] [address], [place] or [address] (336) 852-7530 °(336) 878-2260   °[facility] [address], [place] (336) 350-1642, phone; (336) 294-5005, fax Sees patients 1st and 3rd Saturday of every month.  Must not qualify for public or private insurance (i.e. Medicaid, Medicare, [place] Health Choice, Veterans' Benefits) • Household income should be no more than 200% of the poverty level •The clinic cannot treat you if you are pregnant or think you are pregnant • Sexually transmitted diseases are not treated at the clinic.  ° ° °Dental Care: °Organization         Address  Phone  Notes  °[place] [facility] [address], [place] (336) 641-6152 Accepts children up to age 21 who are enrolled in Medicaid or [place] Health Choice; pregnant women with a Medicaid card; and children who have applied for Medicaid or [place] Health Choice, but were declined, whose parents can pay a reduced fee at time of service.  °[place]  [facility] [place]  [address], [place] (336) 641-7733 Accepts children up to age 21 who are enrolled in Medicaid or [place] Health Choice; pregnant women with a Medicaid card; and children who have applied for Medicaid or [place] Health Choice, but were declined, whose parents can pay a reduced fee at time of service.  °[facility] ° [address], [place] (336) 641-4533 Patients are seen by appointment only. Walk-ins are not accepted. [facility] will see patients 18 years of age and older. °Monday - Tuesday (8am-5pm) °Most Wednesdays (8:30-5pm) °$30 per visit, cash only  °[facility] ° [address], [place] (336) 641-4533 Patients are seen by appointment only. Walk-ins are not accepted. [facility] will see patients 18 years of age and older. °One   Wednesday Evening (Monthly: Volunteer Based).  $30 per visit, cash only  °[facility]  (919) 537-3737 for adults; Children under age 4, call Graduate Pediatric Dentistry at ([phone]) [phone]. Children aged 4-14, please call (919) [phone] to request a pediatric application. ° Dental services are provided in all areas of dental care including fillings, crowns and bridges, complete and partial dentures, implants, gum treatment, root canals, and extractions. Preventive care is also provided. Treatment is provided to both adults and children. °Patients are selected via a lottery and there is often a waiting list. °  °[facility] [address], °[place] ° (336) 763-8833 [email or web address] °  °[facility] [address], [place], [place] (336)723-1848, Ext. 123 Second and Fourth Thursday of each month, opens at 6:30 AM; Clinic ends at 9 AM.  Patients are seen on a first-come first-served basis, and a limited number are seen during each clinic.  ° °[facility] ° [address], [place], [place] (336) 723-7904    Eligibility Requirements °You must have lived in [place], or [place] counties for at least the last three months. °  You cannot be eligible for state or federal sponsored healthcare insurance, including Veterans Administration, Medicaid, or Medicare. °  You generally cannot be eligible for healthcare insurance through your employer.  °  How to apply: °Eligibility screenings are held every Tuesday and Wednesday afternoon from 1:00 pm until 4:00 pm. You do not need an appointment for the interview!  °[facility] [address], [place], [place] 336-631-2330   °[facility]  336-342-8273   °[facility]  336-703-3100   °[facility]  336-570-6415   ° °Behavioral Health Resources in the Community: °Intensive Outpatient Programs °Organization         Address  Phone  Notes  °[facility] [address], [place], [place] 336-878-6098   °[facility] [address], [place], [place] 336-832-9800   °[facility] [address], [place], [place] ° 336-882-2125   °[facility] [address],  °[place], [place] 1-800-853-5163 or 336-641-4981   °Substance Abuse Resources °Organization         Address  Phone  Notes  °[facility]  336-882-2125   °[facility]  336-784-9470   °[facility]  336-285-9073   °[facility]  336-845-3988   °Residential & Outpatient Substance Abuse Program  1-800-659-3381   °Psychological Services °Organization         Address  Phone  Notes  °[facility]  336- 832-9600   °[facility]  336- 378-7881   °[facility] [address], [place] 1-800-853-5163 or 336-641-4981   ° °Mobile Crisis Teams °Organization         Address  Phone  Notes  °[facility], Mobile Crisis Care Unit  1-877-626-1772   °Assertive °[facility] ° [address]  [place], [place] 336-834-9664   °Sharon DeEsch [address] °[place] 336-554-5454   ° °Self-Help/Support Groups °Organization         Address  Phone             Notes  °[facility]  - variety of support groups  336- 373-1402 Call for more information  °Narcotics Anonymous (NA), [facility] [address], °[place]  2 meetings at this location  ° °  Residential Treatment Programs Organization         Address  Phone  Notes  [facility] [address]    [place]  1-501-143-9476   [facility]  [address] [place] [id number], [place], [place]   [place], [place] [phone] Admissions: 8am-3pm M-F  [facility] [address]    [place], [place] [id number]   [facility] [address] [place], [place], [place]   [facility] [address],  [place], [place]   [facility] Intensive Outpatient [address], [place], [place]   [facility] [address]  [place], [place] 1-(815) 568-3403 or (918)157-2814   [facility] [address], [place], [place] Accepts Medicaid  [facility] [address],  [place] 1-917-719-8645 Substance Abuse/Addiction Treatment   [facility]         Address  Phone  Notes  [facility]  281-336-0288   Domenic Schwab, PhD [address] [place], [place]   249-339-4698 or 4130170465   [facility] [place], [place] (623)228-4711   [facility] [address] [place], [place] 385-603-6604 Insurance/Medicaid/sponsorship through [facility] [address]                                    [place], [place] [phone] [place] [address], [place] [phone]    Dr. Adele Schilder  (360)[phone]   [facility] Dept. 1) [address]  2) [place] 3)  [place] [address], [place] [phone]  [phone]   [place] 626-530-3313 or [phone] (After Hours)      Take the prescriptions as directed.  Use your albuterol inhaler (2 to 4 puffs) or your albuterol nebulizer (1 unit dose) every 4 hours for the next 7 days, then as needed for cough, wheezing, or shortness of breath.  Call your regular medical doctor tomorrow morning to schedule a follow up appointment within the next 3 days.  Return to the Emergency Department immediately sooner if worsening.

## 2014-09-22 NOTE — Progress Notes (Signed)
Patient refusing arterial stick of ABG analysis. RN and EDP made aware. Order cancelled per protocol.

## 2014-09-22 NOTE — ED Notes (Signed)
RT notified of pt's arterial blood gas ordered---- at IR at this time.

## 2014-09-22 NOTE — ED Notes (Signed)
Pt refused blood draw for arterial blood gas--- stated, "I refused, I don't like to be a [place] pig;  I am going home anyway".

## 2014-09-24 LAB — URINE CULTURE

## 2014-09-29 IMAGING — CR DG CHEST 2V
2 series · 2 of 2 positions shown · non-contrast
Comparison: 09/07/2011

CLINICAL DATA: Shortness of breath, cough

CHEST - 2 VIEW

[w chest lat]
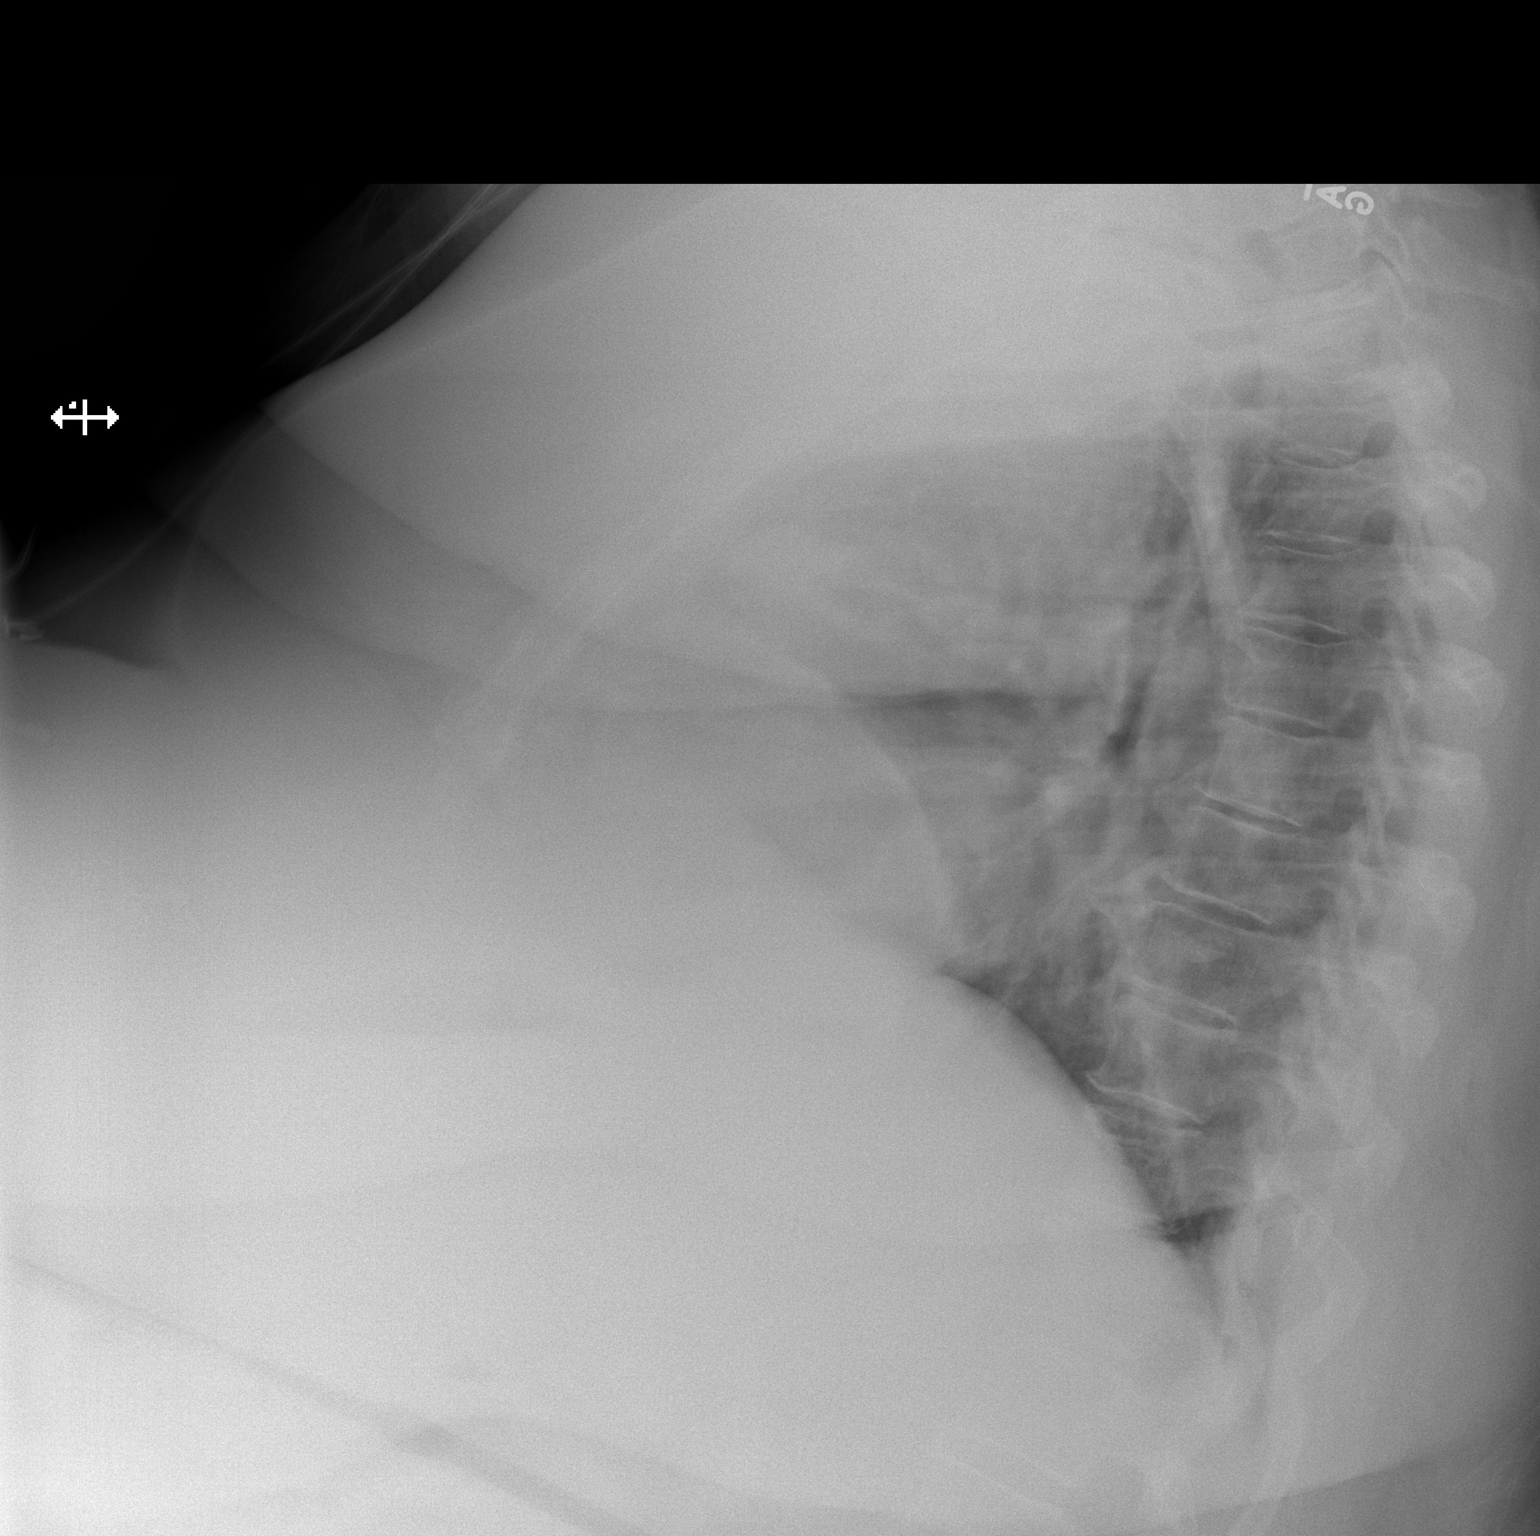

[x chest ap]
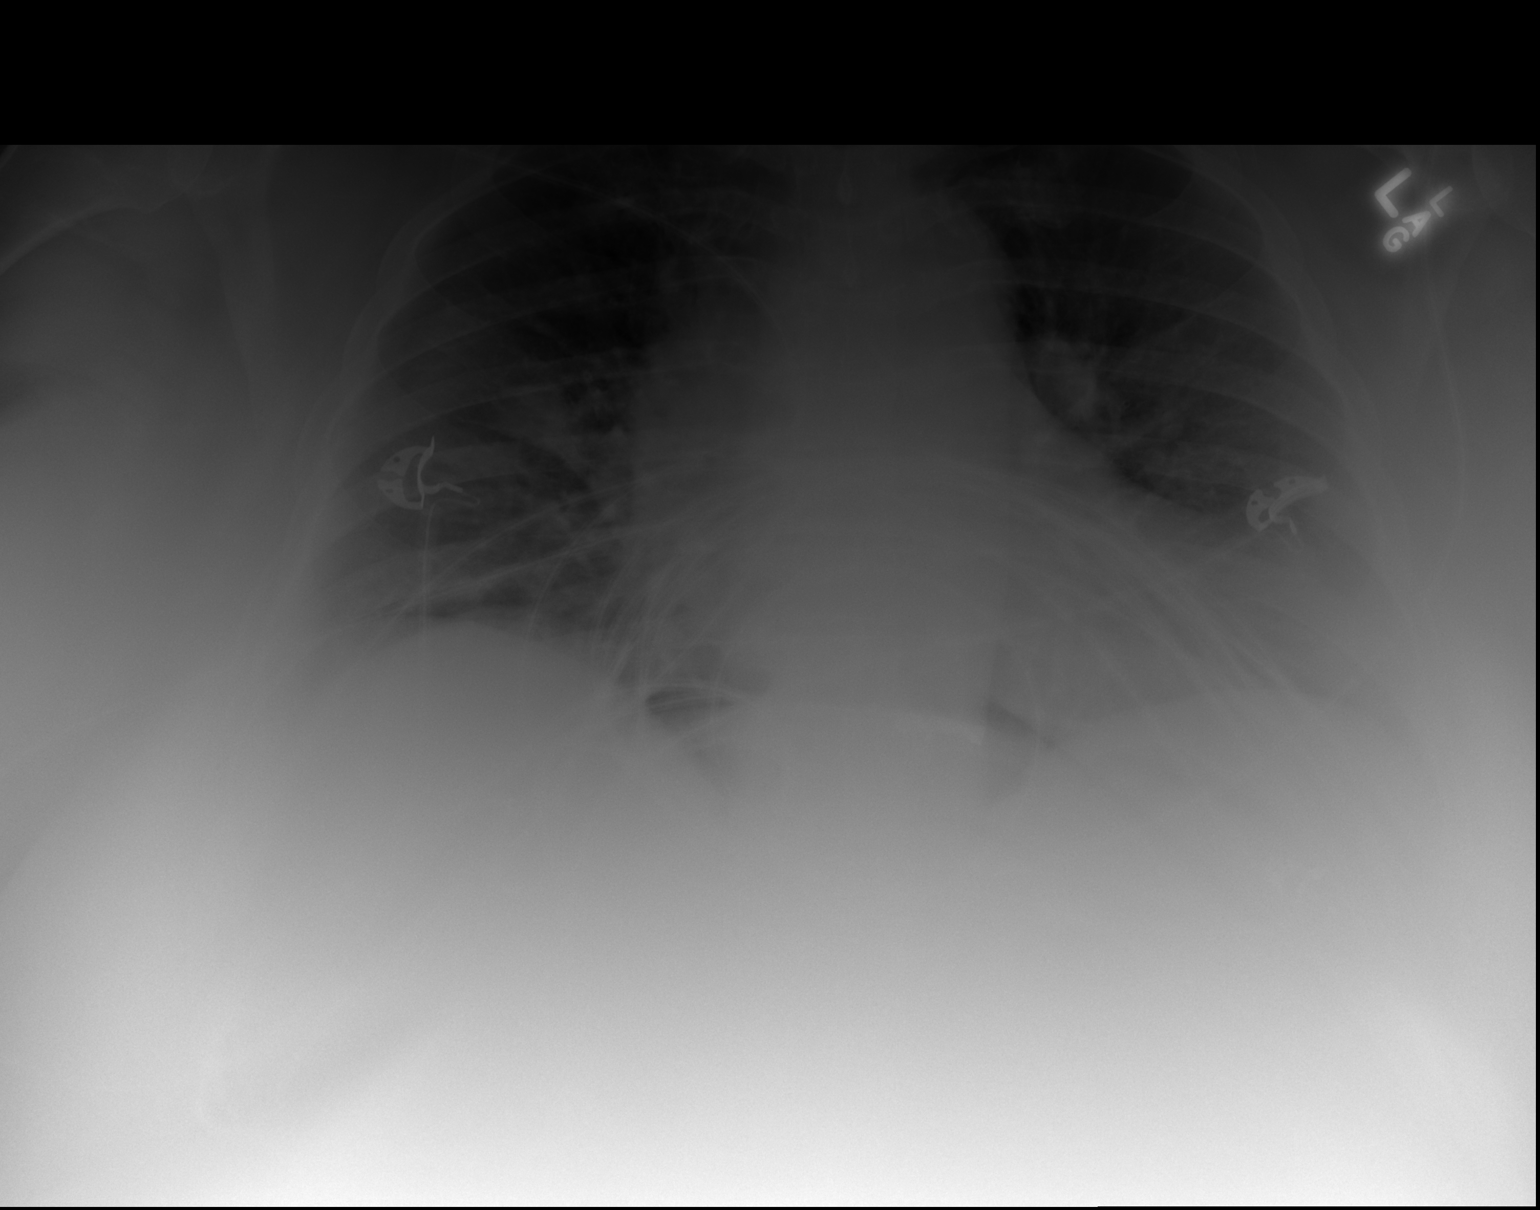

[2 of 2 positions shown; findings below may reference images not displayed]

FINDINGS: Mild patchy right upper lobe opacity, possibly reflecting
pneumonia. No pleural effusion or pneumothorax.

Cardiomegaly.

Degenerative changes of the visualized thoracolumbar spine.
IMPRESSION: Mild patchy right upper lobe opacity, possibly reflecting
pneumonia.

## 2014-10-01 IMAGING — CT CT CHEST W/ CM
1 series · 16 of 31 positions shown, 20 images · IV contrast (OMNIPAQUE 300)
Comparison: 01/15/2011

CLINICAL DATA: Shortness of breath

CT CHEST WITH CONTRAST
TECHNIQUE: Multidetector CT imaging of the chest was performed
following the standard protocol during bolus administration of
intravenous contrast.
Contrast: 100mL OMNIPAQUE IOHEXOL 300 MG/ML  SOLN

[Series 2: chest with st · axial · 0.95mm/px · z∈[-396,-156]mm · 16 of 52 slices shown, 20 images]
[im 2/52  mediastinal]
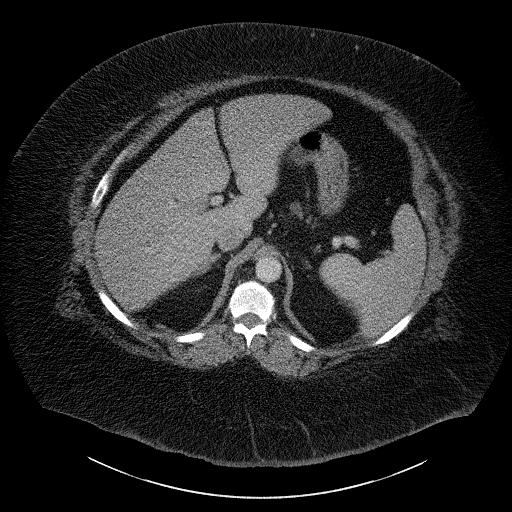
[im 2/52  lung]
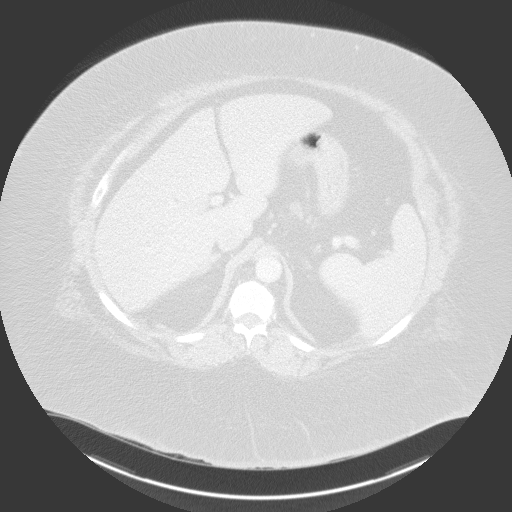
[im 6/52  lung]
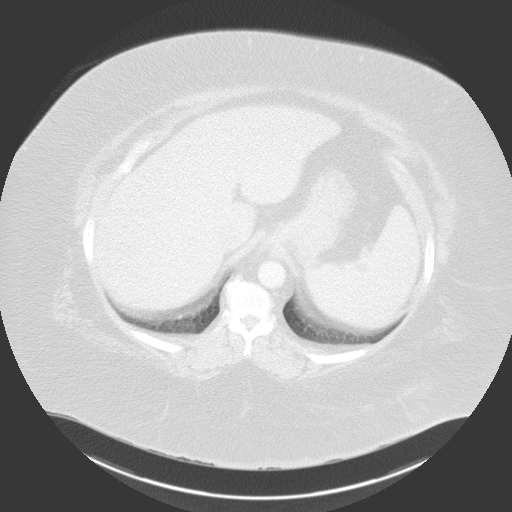
[im 10/52  lung]
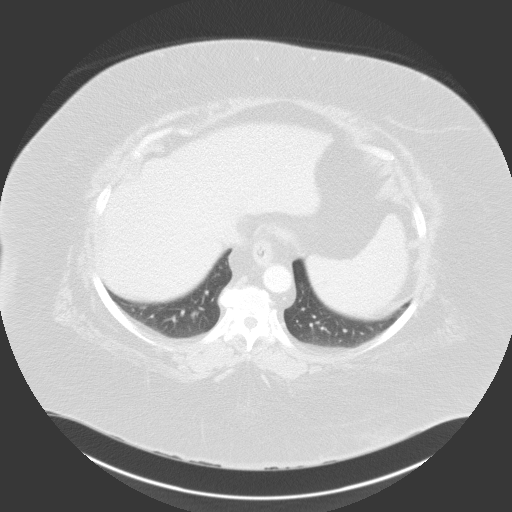
[im 12/52  lung]
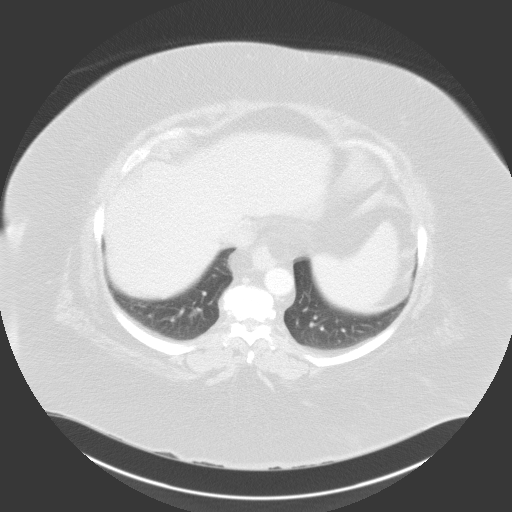
[im 16/52  mediastinal]
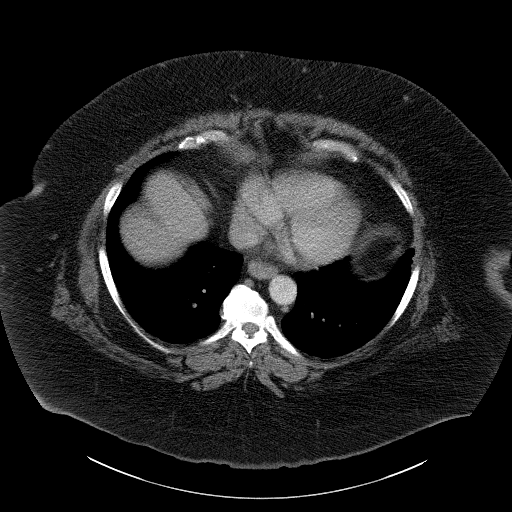
[im 16/52  lung]
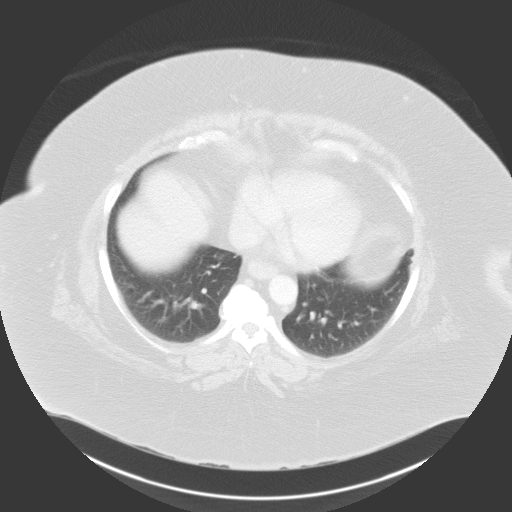
[im 18/52  lung]
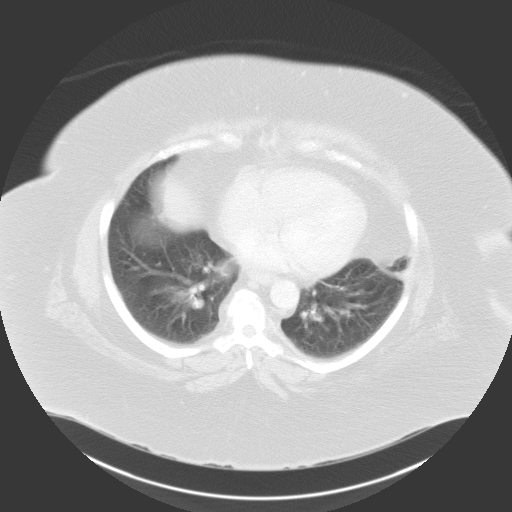
[im 21/52  lung]
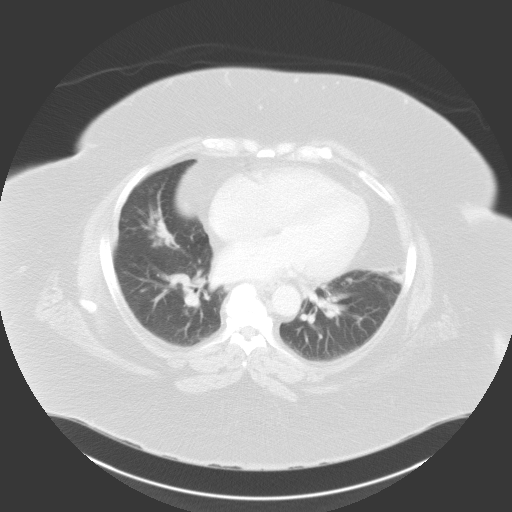
[im 25/52  lung]
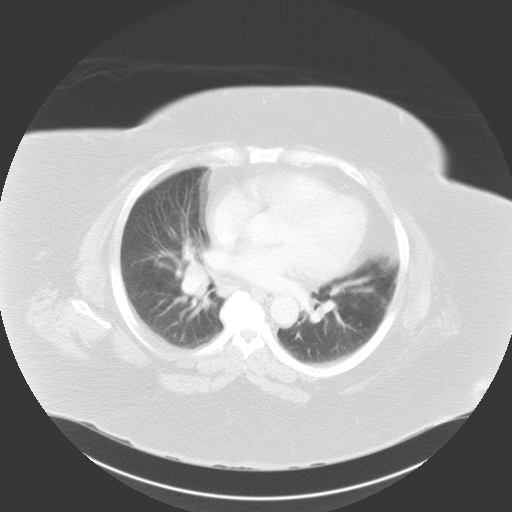
[im 28/52  mediastinal]
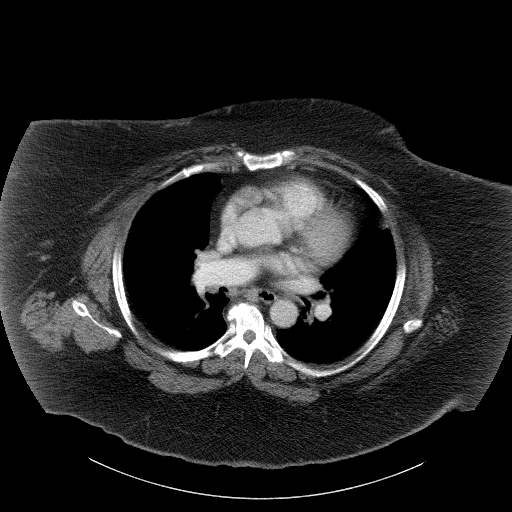
[im 28/52  lung]
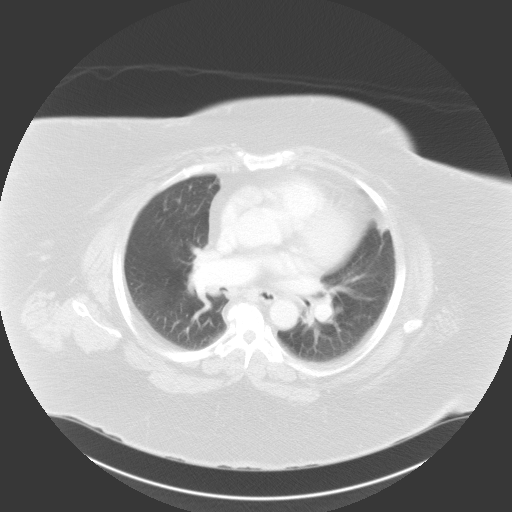
[im 31/52  lung]
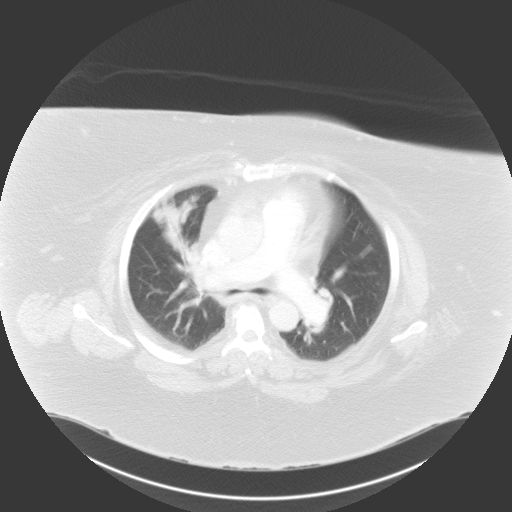
[im 35/52  lung]
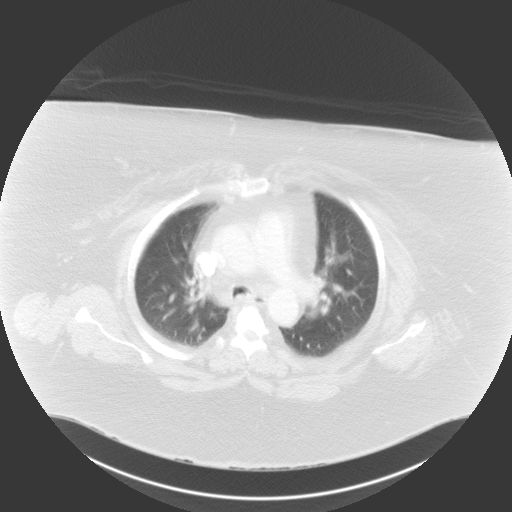
[im 36/52  lung]
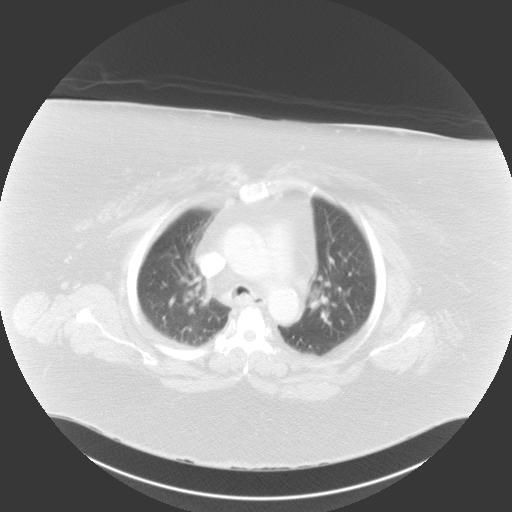
[im 40/52  mediastinal]
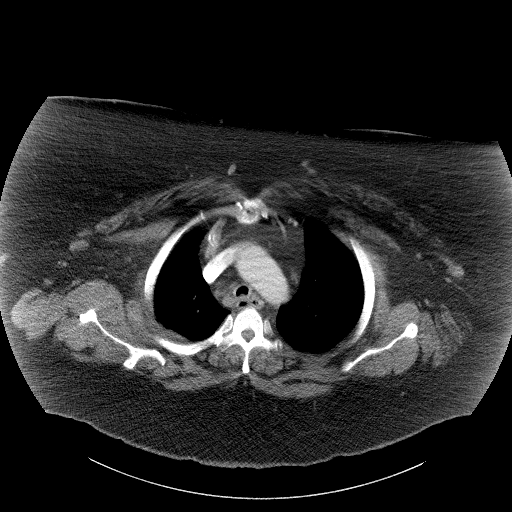
[im 40/52  lung]
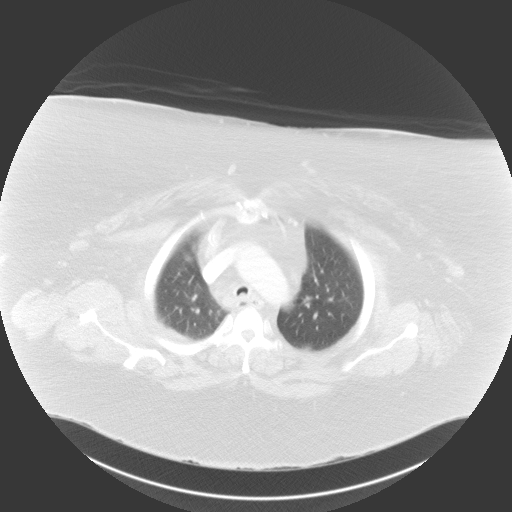
[im 42/52  lung]
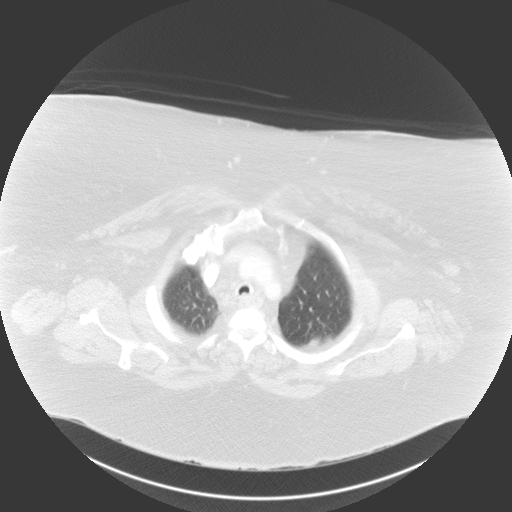
[im 46/52  lung]
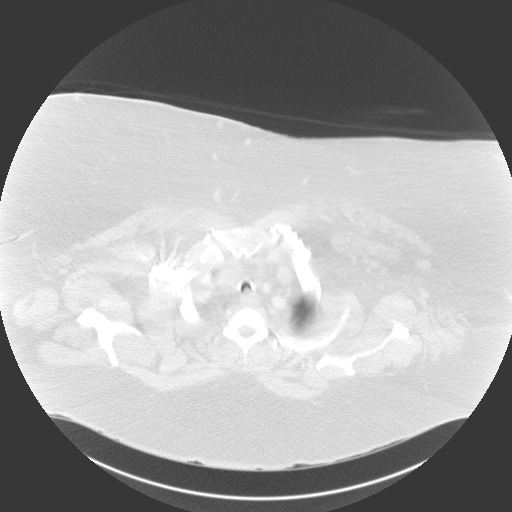
[im 50/52  lung]
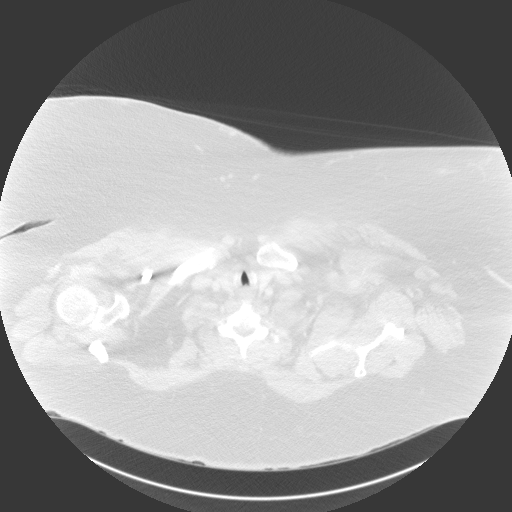

[16 of 31 positions shown; findings below may reference images not displayed]

FINDINGS: Degraded by patient body habitus and respiratory motion.

Right upper and middle lobe airspace opacity and to a lesser
extent, lingula. No pneumothorax.

Normal caliber aorta.  No main branch pulmonary arterial filling
defect.  This exam is not optimized to evaluate the pulmonary
vasculature.  Normal heart size.  No pleural or pericardial
effusion. Mildly prominent right paratracheal and right hilar lymph
nodes.

Limited images through the upper abdomen show no acute finding.

No acute osseous finding, multilevel degenerative change.
IMPRESSION: Right upper and middle lobe airspace opacities and to a lesser
extent lingular opacity, atelectasis versus pneumonia.  Recommend
radiograph follow-up after therapy to document resolution.

## 2014-10-01 IMAGING — CR DG CHEST 2V
2 series · 2 of 2 positions shown · non-contrast
Comparison: 03/12/2012

CLINICAL DATA: Cough and congestion.

CHEST - 2 VIEW

[w chest lat]
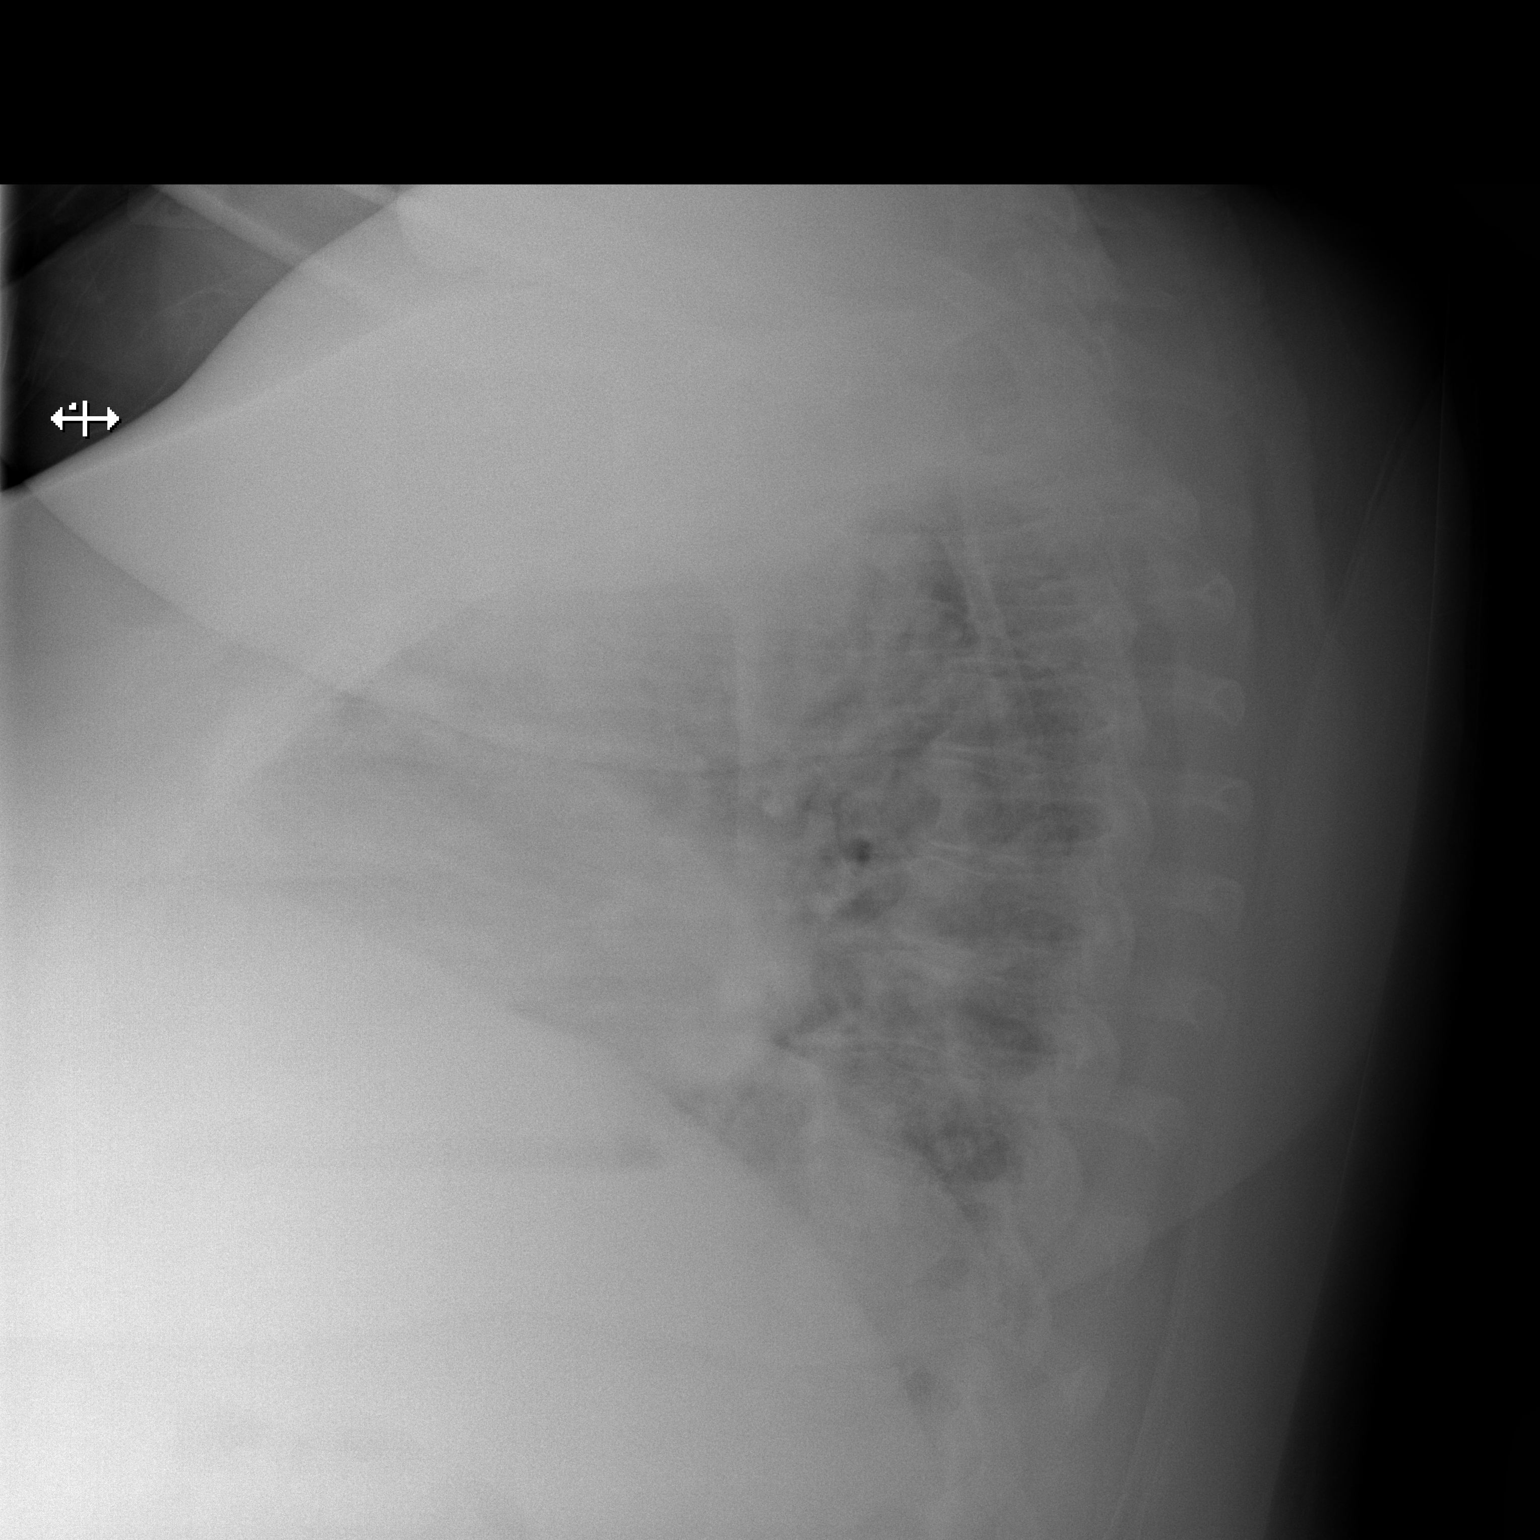

[x chest ap]
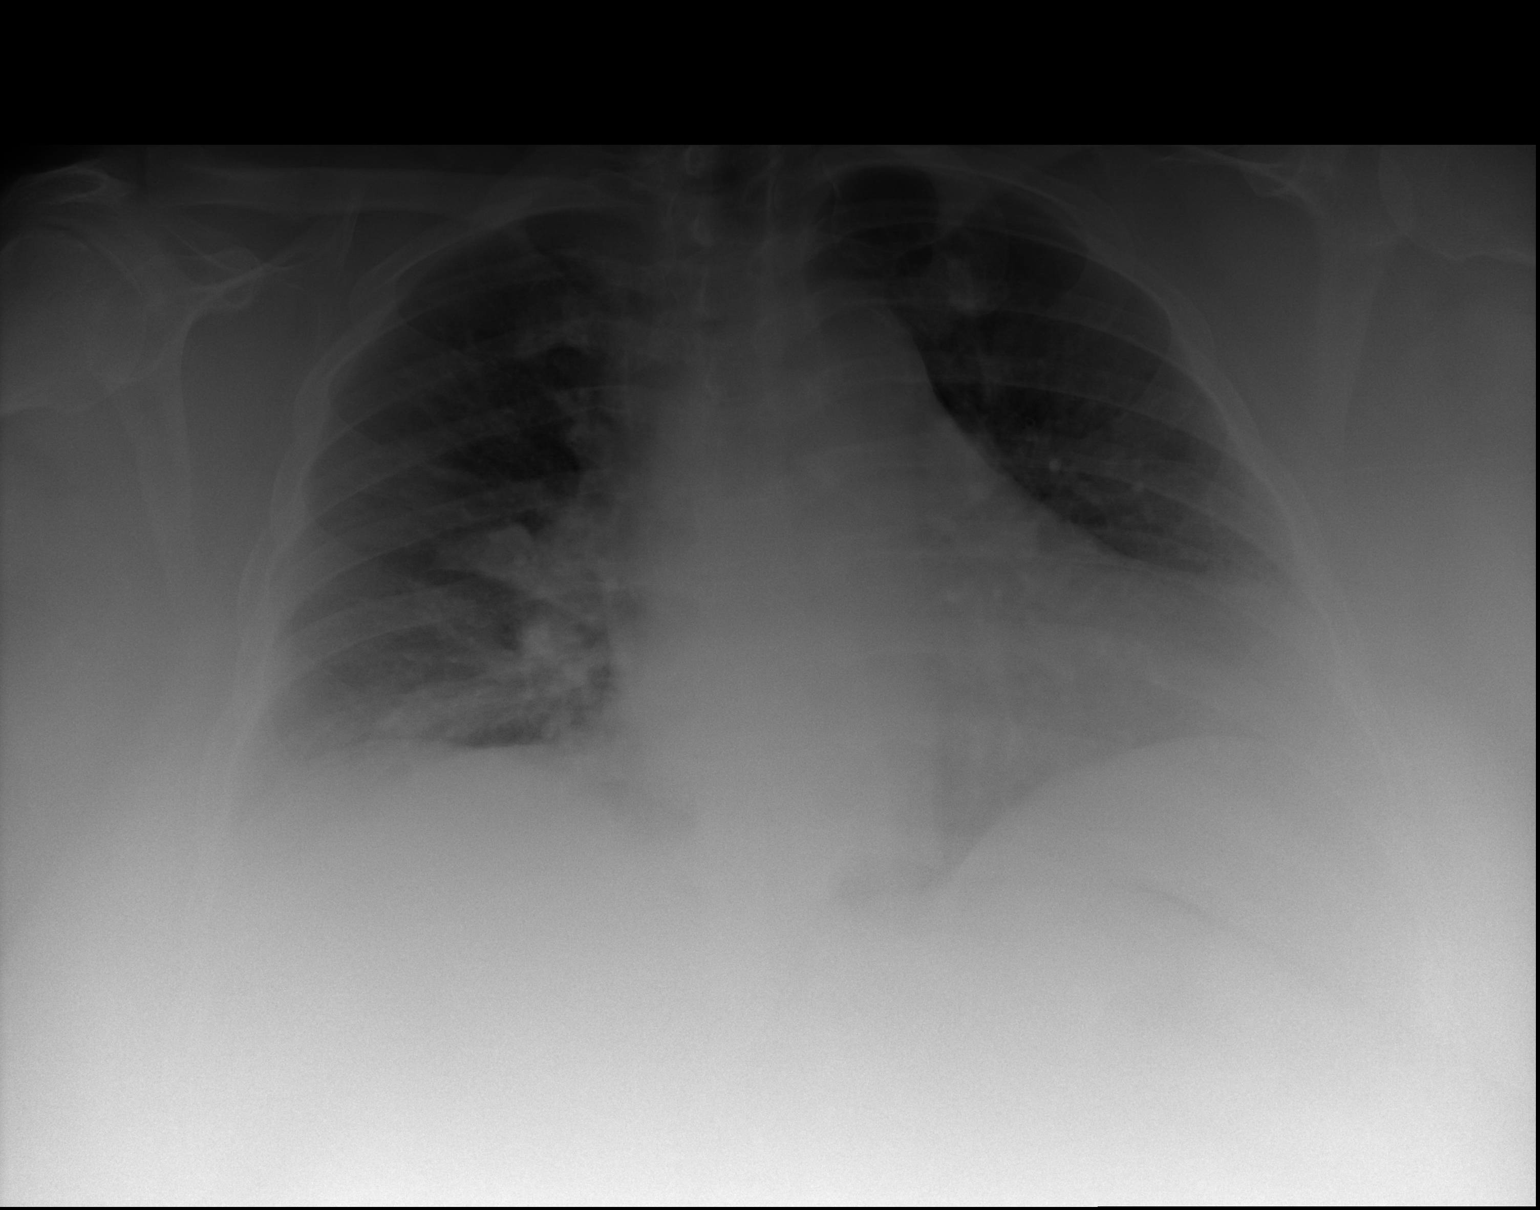

[2 of 2 positions shown; findings below may reference images not displayed]

FINDINGS: Two views of the chest again demonstrate low lung
volumes.  There are persistent densities in the right hilum and
right lung base.  Heart size remains enlarged.  Trachea is midline.
Bony thorax is intact.
IMPRESSION: Minimal change since the previous examination.  Again
noted are densities in the right hilum and right lower lung region.
Findings could be related to areas of volume loss.  An infectious
etiology cannot be excluded.

## 2014-10-03 IMAGING — CR DG CHEST 2V
3 series · 3 of 3 positions shown · non-contrast
Comparison: Chest radiograph and CT of the chest performed
03/14/2012

CLINICAL DATA: Shortness of breath and cough.

CHEST - 2 VIEW

[x chest ap]
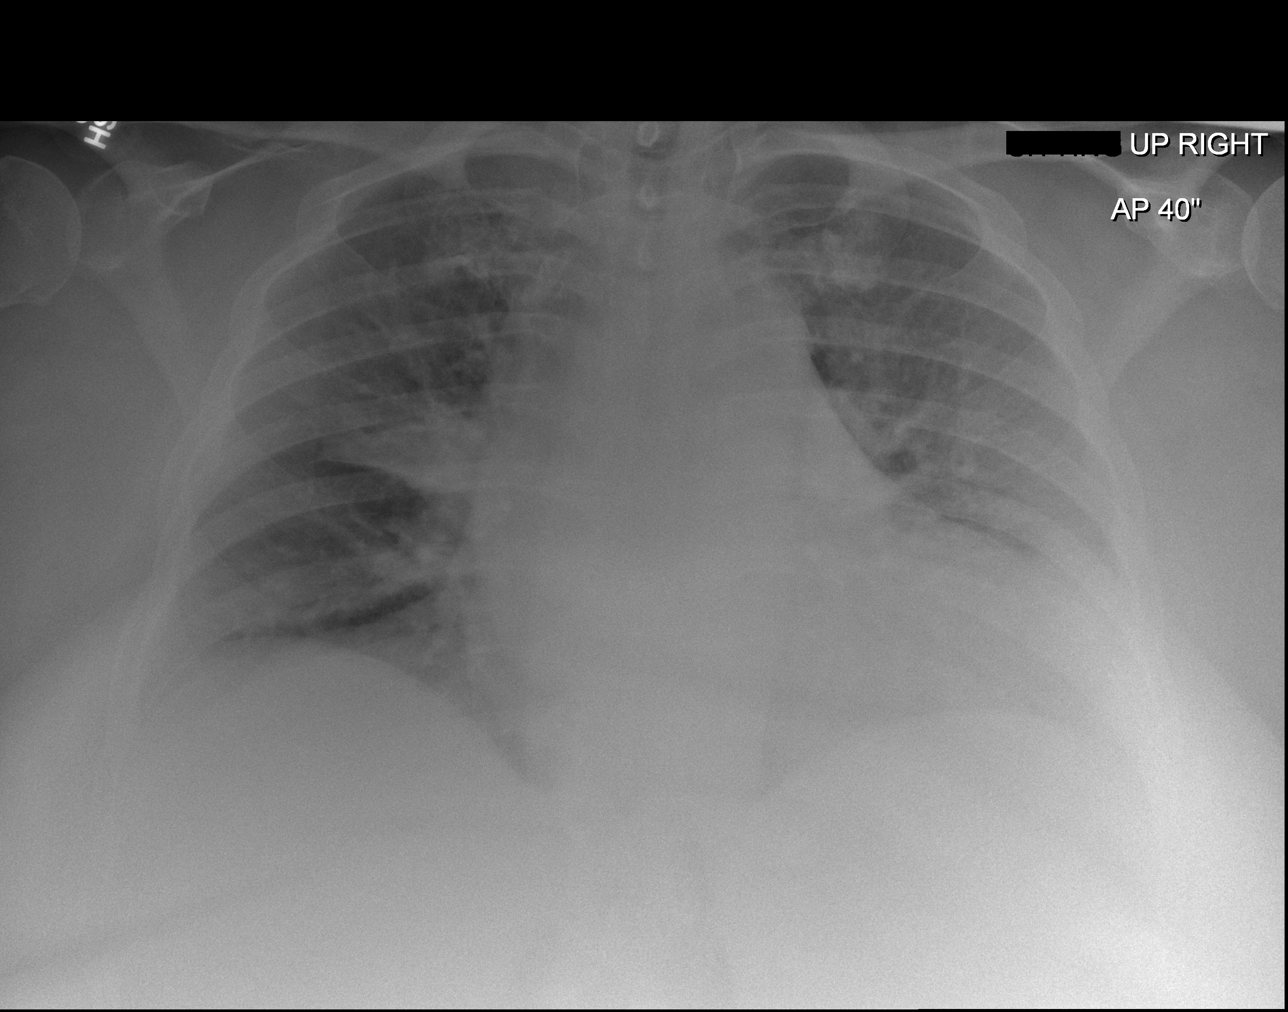

[w chest lat (1 of 2)]
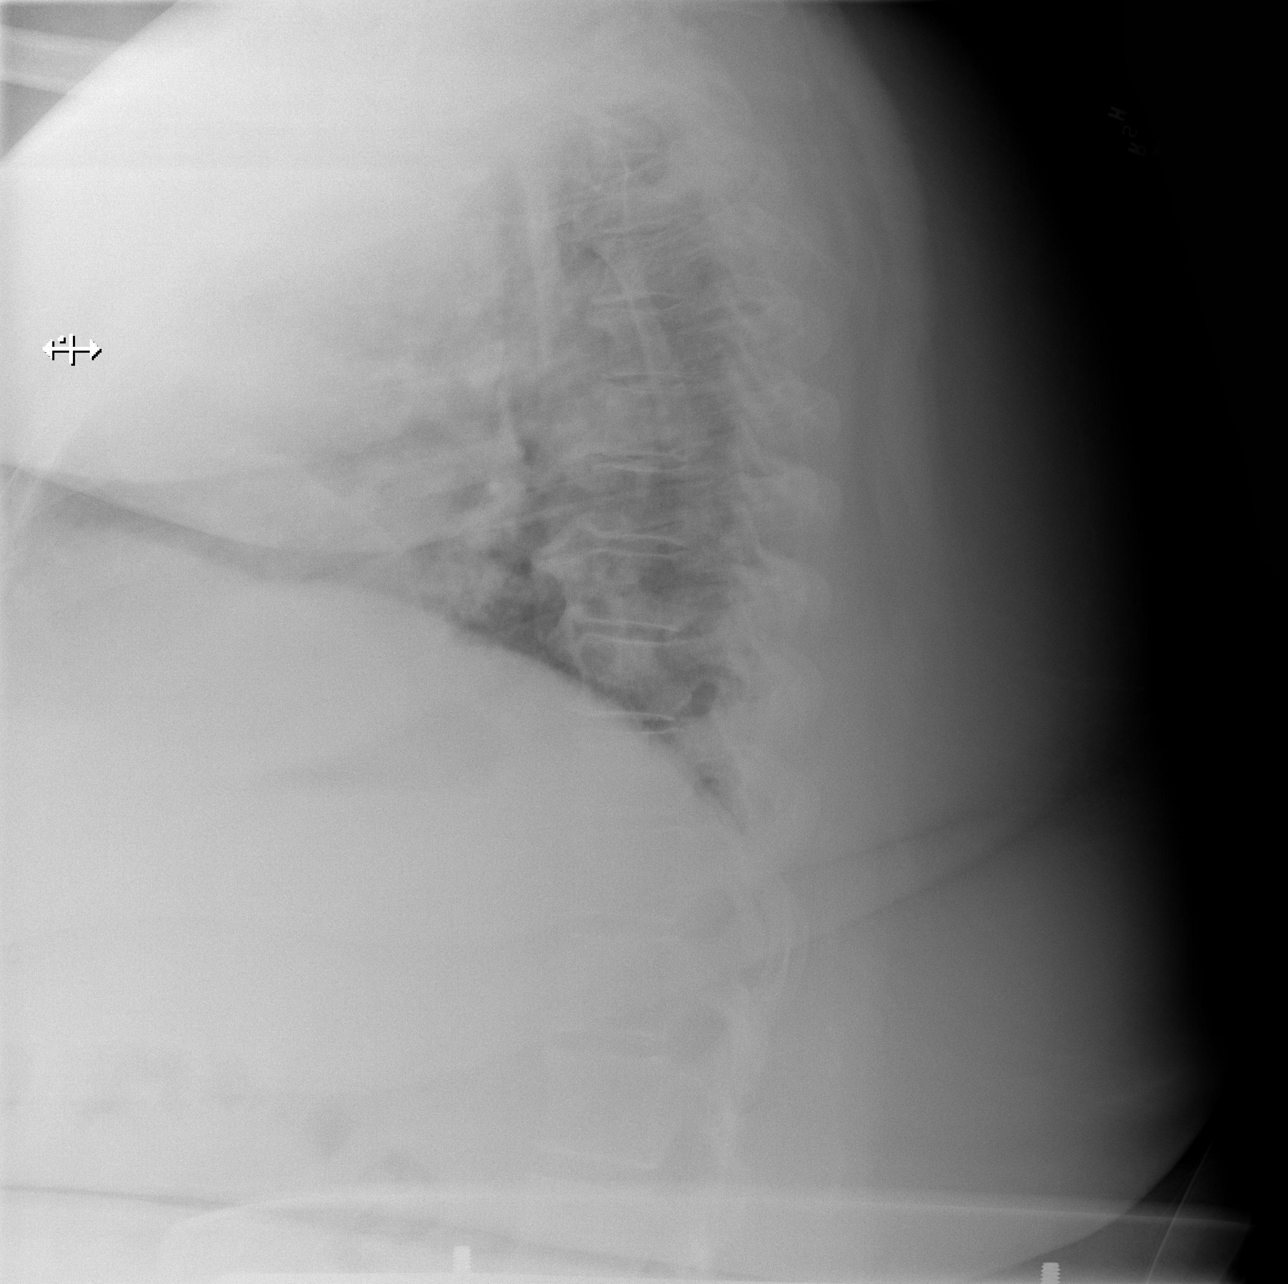

[w chest lat (2 of 2)]
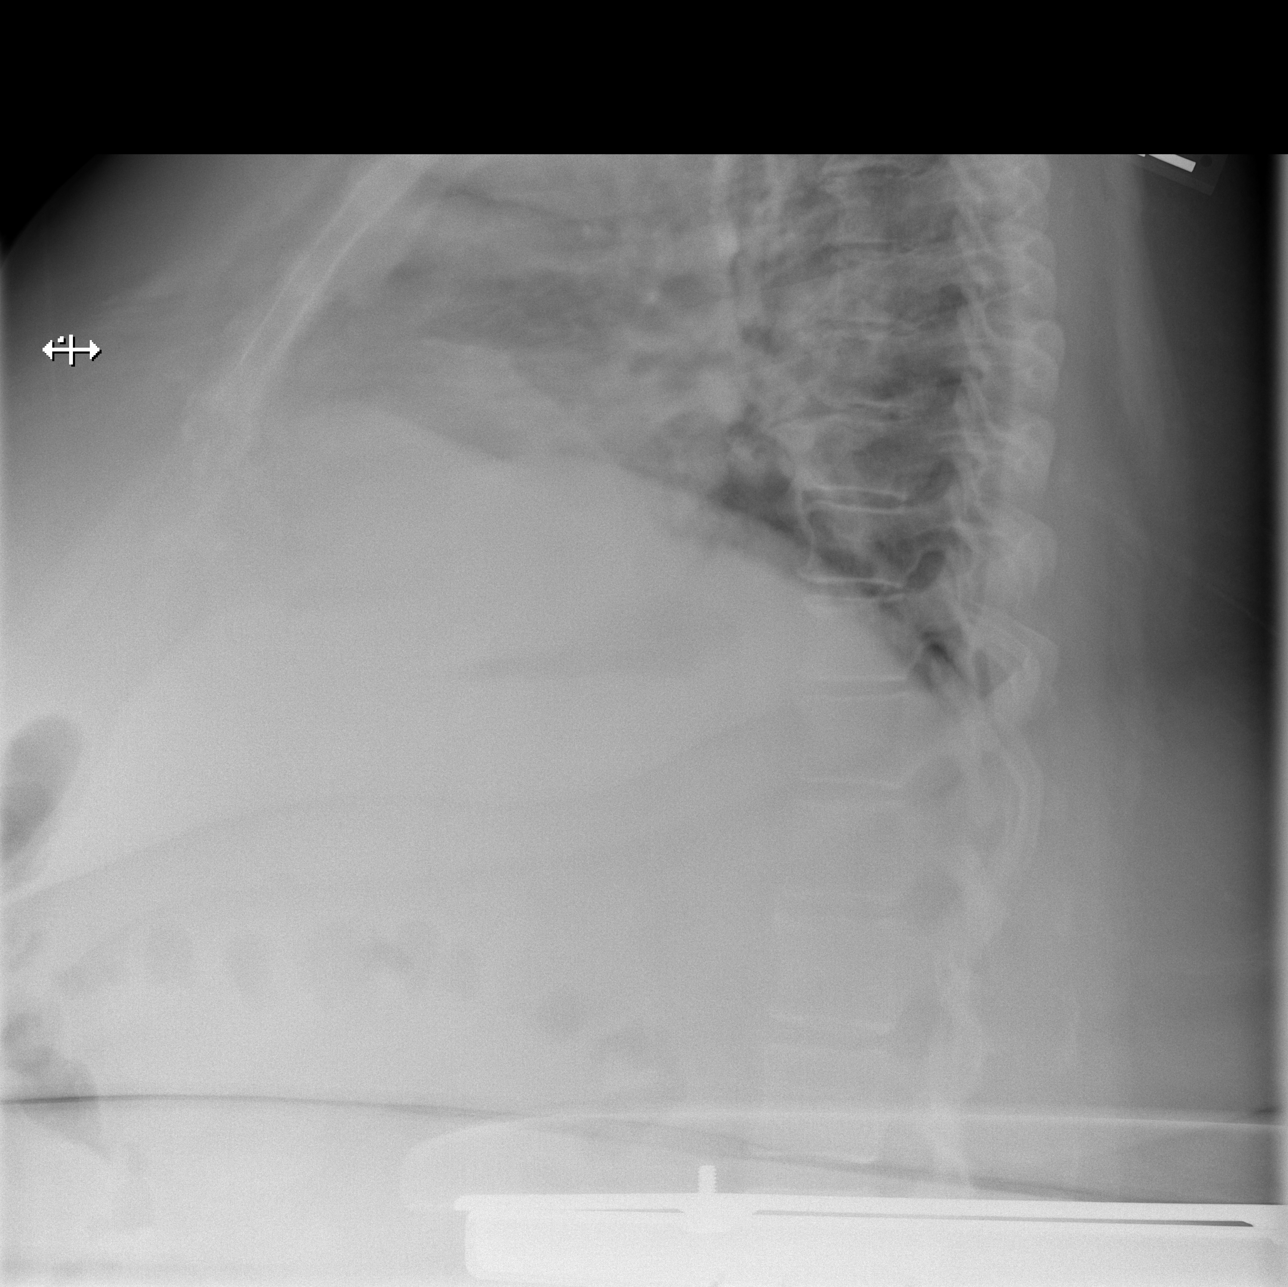

[3 of 3 positions shown; findings below may reference images not displayed]

FINDINGS: The lungs are relatively well-aerated.  Mildly worsened
right upper and middle lobe airspace opacities are again noted,
concerning for pneumonia.  Underlying vascular congestion is seen.
No definite pleural effusion or pneumothorax is identified.

The heart is borderline normal in size; the mediastinal contour is
within normal limits.  No acute osseous abnormalities are seen.
IMPRESSION: Mildly worsened right upper and middle lobe airspace opacities,
concerning for mildly worsened pneumonia.  Underlying vascular
congestion noted.

## 2014-10-09 IMAGING — CR DG CHEST 2V
1 series · 1 of 1 positions shown · non-contrast
Comparison: 03/16/2012

CLINICAL DATA: Shortness of breath.  Morbid obesity.

CHEST - 2 VIEW

[w chest lat]
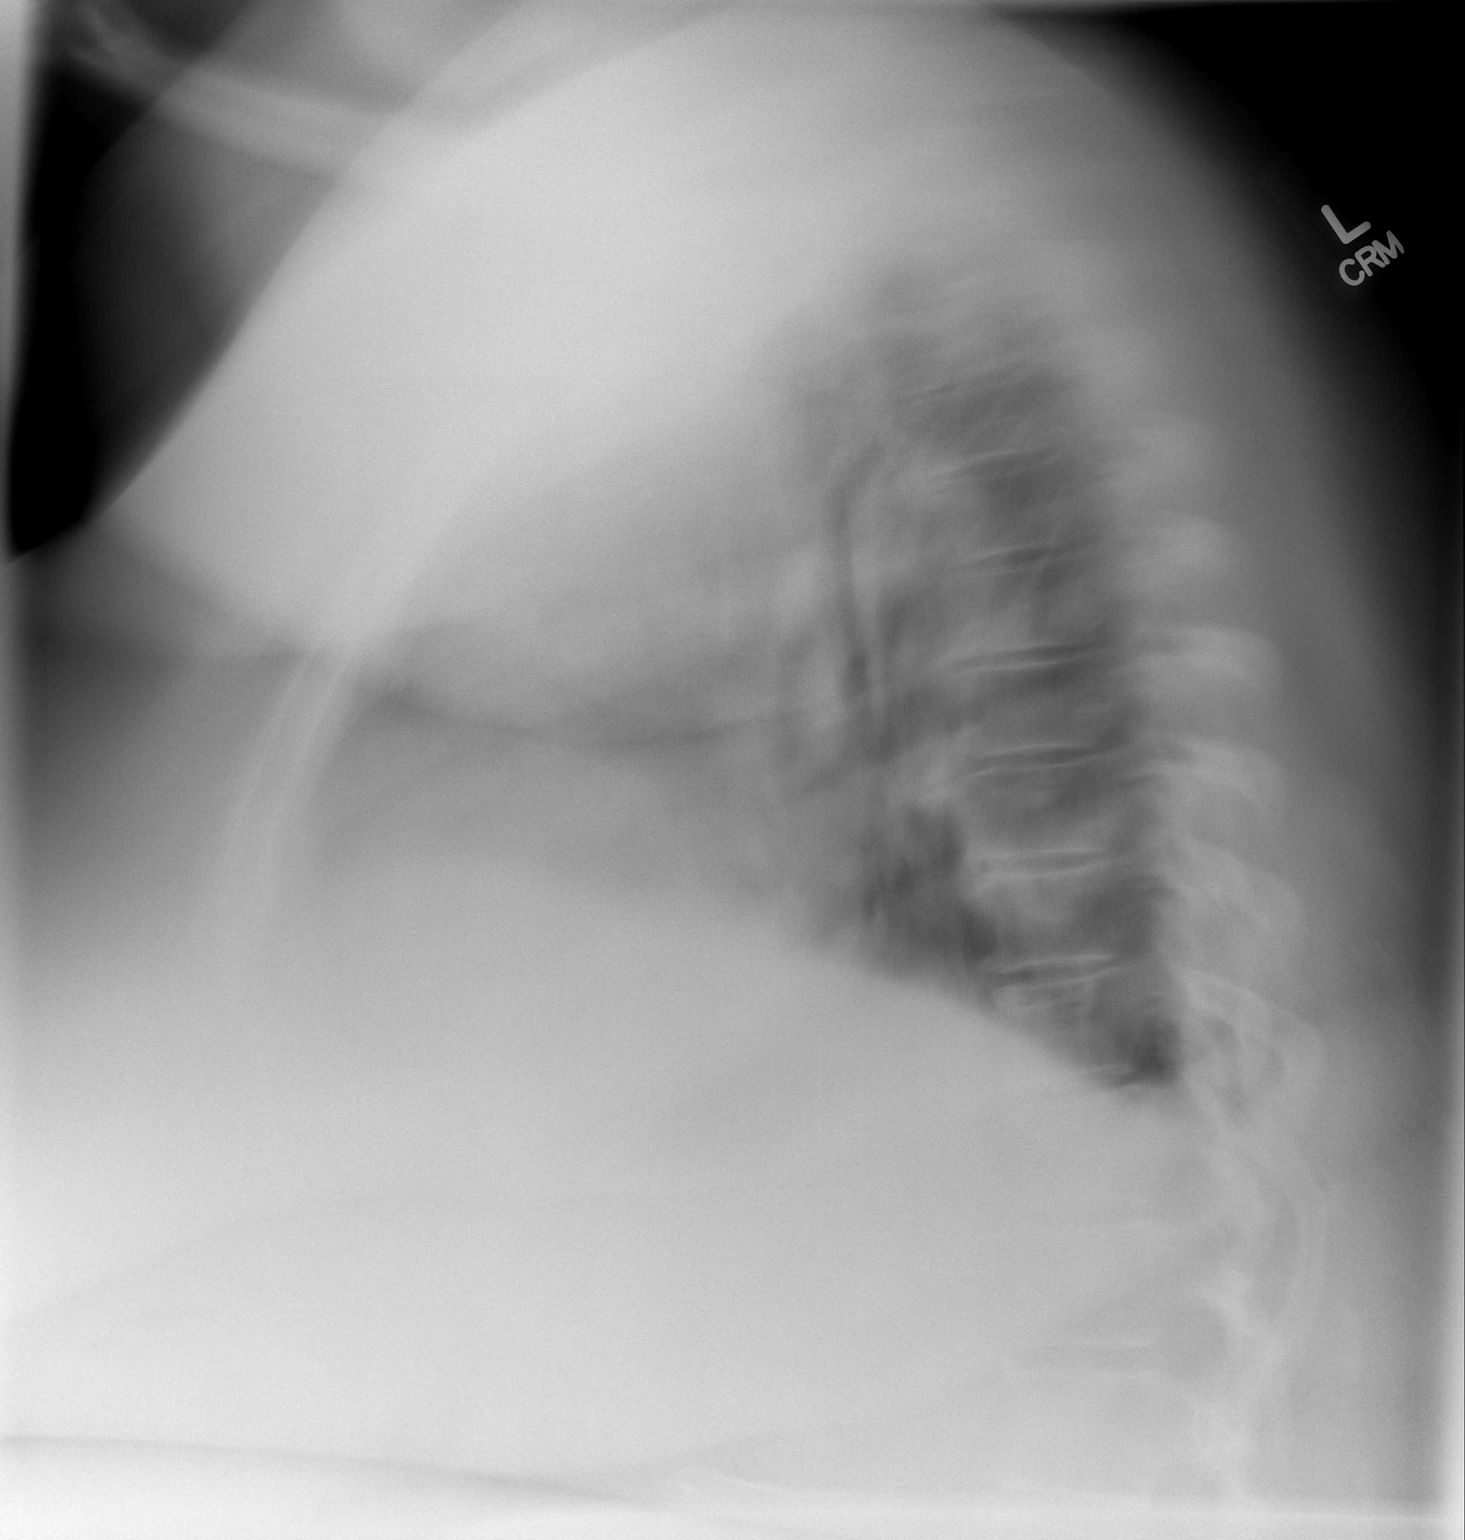

[1 of 1 positions shown; findings below may reference images not displayed]

FINDINGS: Nondiagnostic lateral view.  Motion degradation and
patient body habitus.  The frontal view is moderately degraded as
well. Cardiomegaly accentuated by AP portable technique.  No right
and no definite left pleural effusion. No pneumothorax.  Mild
interstitial edema, slightly improved.  Minimally improved right
upper and midlung airspace opacities.  Probable left base volume
loss/atelectasis.
IMPRESSION: 1.  Moderately degraded exam.
2.  Mild congestive heart failure, minimally improved.
3.  Slight improvement in right-sided airspace opacities, likely
infection.

## 2014-11-15 ENCOUNTER — Inpatient Hospital Stay (HOSPITAL_COMMUNITY)
Admission: EM | Admit: 2014-11-15 | Discharge: 2014-11-21 | DRG: 202 | Disposition: A | Discharge status: Home / Self Care | Payer: Medicaid Other | Attending: Internal Medicine | Admitting: Internal Medicine

## 2014-11-15 ENCOUNTER — Emergency Department (HOSPITAL_COMMUNITY): Payer: Medicaid Other

## 2014-11-15 ENCOUNTER — Encounter (HOSPITAL_COMMUNITY): Payer: Self-pay | Admitting: *Deleted

## 2014-11-15 DIAGNOSIS — I959 Hypotension, unspecified: Secondary | ICD-10-CM | POA: Diagnosis present

## 2014-11-15 DIAGNOSIS — J9621 Acute and chronic respiratory failure with hypoxia: Secondary | ICD-10-CM | POA: Diagnosis present

## 2014-11-15 DIAGNOSIS — I272 Other secondary pulmonary hypertension: Secondary | ICD-10-CM | POA: Diagnosis present

## 2014-11-15 DIAGNOSIS — Z8249 Family history of ischemic heart disease and other diseases of the circulatory system: Secondary | ICD-10-CM

## 2014-11-15 DIAGNOSIS — Z7982 Long term (current) use of aspirin: Secondary | ICD-10-CM

## 2014-11-15 DIAGNOSIS — J9622 Acute and chronic respiratory failure with hypercapnia: Secondary | ICD-10-CM | POA: Diagnosis present

## 2014-11-15 DIAGNOSIS — Z9981 Dependence on supplemental oxygen: Secondary | ICD-10-CM

## 2014-11-15 DIAGNOSIS — L989 Disorder of the skin and subcutaneous tissue, unspecified: Secondary | ICD-10-CM | POA: Diagnosis present

## 2014-11-15 DIAGNOSIS — Z87891 Personal history of nicotine dependence: Secondary | ICD-10-CM

## 2014-11-15 DIAGNOSIS — G8929 Other chronic pain: Secondary | ICD-10-CM | POA: Diagnosis present

## 2014-11-15 DIAGNOSIS — J811 Chronic pulmonary edema: Secondary | ICD-10-CM

## 2014-11-15 DIAGNOSIS — K219 Gastro-esophageal reflux disease without esophagitis: Secondary | ICD-10-CM | POA: Diagnosis present

## 2014-11-15 DIAGNOSIS — J209 Acute bronchitis, unspecified: Secondary | ICD-10-CM

## 2014-11-15 DIAGNOSIS — I5030 Unspecified diastolic (congestive) heart failure: Secondary | ICD-10-CM | POA: Diagnosis present

## 2014-11-15 DIAGNOSIS — M79669 Pain in unspecified lower leg: Secondary | ICD-10-CM | POA: Diagnosis present

## 2014-11-15 DIAGNOSIS — Z9119 Patient's noncompliance with other medical treatment and regimen: Secondary | ICD-10-CM | POA: Diagnosis present

## 2014-11-15 DIAGNOSIS — R0600 Dyspnea, unspecified: Secondary | ICD-10-CM

## 2014-11-15 DIAGNOSIS — Z8614 Personal history of Methicillin resistant Staphylococcus aureus infection: Secondary | ICD-10-CM

## 2014-11-15 DIAGNOSIS — Z590 Homelessness: Secondary | ICD-10-CM

## 2014-11-15 DIAGNOSIS — G4733 Obstructive sleep apnea (adult) (pediatric): Secondary | ICD-10-CM | POA: Insufficient documentation

## 2014-11-15 DIAGNOSIS — R600 Localized edema: Secondary | ICD-10-CM

## 2014-11-15 DIAGNOSIS — I50810 Right heart failure, unspecified: Secondary | ICD-10-CM

## 2014-11-15 DIAGNOSIS — Z79899 Other long term (current) drug therapy: Secondary | ICD-10-CM

## 2014-11-15 DIAGNOSIS — Q828 Other specified congenital malformations of skin: Secondary | ICD-10-CM

## 2014-11-15 DIAGNOSIS — Z823 Family history of stroke: Secondary | ICD-10-CM

## 2014-11-15 DIAGNOSIS — K5900 Constipation, unspecified: Secondary | ICD-10-CM | POA: Diagnosis present

## 2014-11-15 DIAGNOSIS — N183 Chronic kidney disease, stage 3 unspecified: Secondary | ICD-10-CM

## 2014-11-15 DIAGNOSIS — E785 Hyperlipidemia, unspecified: Secondary | ICD-10-CM | POA: Diagnosis present

## 2014-11-15 DIAGNOSIS — N179 Acute kidney failure, unspecified: Secondary | ICD-10-CM | POA: Diagnosis present

## 2014-11-15 DIAGNOSIS — E662 Morbid (severe) obesity with alveolar hypoventilation: Secondary | ICD-10-CM | POA: Diagnosis present

## 2014-11-15 DIAGNOSIS — Z6841 Body Mass Index (BMI) 40.0 and over, adult: Secondary | ICD-10-CM

## 2014-11-15 DIAGNOSIS — Z825 Family history of asthma and other chronic lower respiratory diseases: Secondary | ICD-10-CM

## 2014-11-15 DIAGNOSIS — N189 Chronic kidney disease, unspecified: Secondary | ICD-10-CM

## 2014-11-15 DIAGNOSIS — E872 Acidosis: Secondary | ICD-10-CM | POA: Diagnosis present

## 2014-11-15 DIAGNOSIS — E875 Hyperkalemia: Secondary | ICD-10-CM | POA: Diagnosis present

## 2014-11-15 DIAGNOSIS — J45901 Unspecified asthma with (acute) exacerbation: Secondary | ICD-10-CM | POA: Diagnosis present

## 2014-11-15 DIAGNOSIS — I129 Hypertensive chronic kidney disease with stage 1 through stage 4 chronic kidney disease, or unspecified chronic kidney disease: Secondary | ICD-10-CM | POA: Diagnosis present

## 2014-11-15 DIAGNOSIS — I5032 Chronic diastolic (congestive) heart failure: Secondary | ICD-10-CM | POA: Diagnosis present

## 2014-11-15 DIAGNOSIS — Z833 Family history of diabetes mellitus: Secondary | ICD-10-CM

## 2014-11-15 DIAGNOSIS — I878 Other specified disorders of veins: Secondary | ICD-10-CM | POA: Diagnosis present

## 2014-11-15 LAB — COMPREHENSIVE METABOLIC PANEL
ALBUMIN: 3.5 g/dL (ref 3.5–5.0)
ALBUMIN: 3.2 g/dL — AB (ref 3.5–5.0)
ALT: 15 U/L (ref 14–54)
ALT: 16 U/L (ref 14–54)
ANION GAP: 9 (ref 5–15)
ANION GAP: 6 (ref 5–15)
AST: 36 U/L (ref 15–41)
AST: 33 U/L (ref 15–41)
Alkaline Phosphatase: 53 U/L (ref 38–126)
Alkaline Phosphatase: 49 U/L (ref 38–126)
BUN: 25 mg/dL — ABNORMAL HIGH (ref 6–20)
BUN: 24 mg/dL — ABNORMAL HIGH (ref 6–20)
CO2: 29 mmol/L (ref 22–32)
CO2: 31 mmol/L (ref 22–32)
Calcium: 8.2 mg/dL — ABNORMAL LOW (ref 8.9–10.3)
Calcium: 8.2 mg/dL — ABNORMAL LOW (ref 8.9–10.3)
Chloride: 101 mmol/L (ref 101–111)
Chloride: 105 mmol/L (ref 101–111)
Creatinine, Ser: 1.44 mg/dL — ABNORMAL HIGH (ref 0.44–1.00)
Creatinine, Ser: 1.4 mg/dL — ABNORMAL HIGH (ref 0.44–1.00)
GFR calc Af Amer: 48 mL/min — ABNORMAL LOW (ref >60)
GFR calc Af Amer: 50 mL/min — ABNORMAL LOW (ref >60)
GFR calc non Af Amer: 43 mL/min — ABNORMAL LOW (ref >60)
GFR, EST NON AFRICAN AMERICAN: 42 mL/min — AB (ref >60)
GLUCOSE: 99 mg/dL (ref 65–99)
Glucose, Bld: 96 mg/dL (ref 65–99)
POTASSIUM: 3.9 mmol/L (ref 3.5–5.1)
POTASSIUM: 4.2 mmol/L (ref 3.5–5.1)
Sodium: 139 mmol/L (ref 135–145)
Sodium: 142 mmol/L (ref 135–145)
Total Bilirubin: 0.5 mg/dL (ref 0.3–1.2)
Total Bilirubin: 0.4 mg/dL (ref 0.3–1.2)
Total Protein: 7 g/dL (ref 6.5–8.1)
Total Protein: 6.5 g/dL (ref 6.5–8.1)

## 2014-11-15 LAB — CBC WITH DIFFERENTIAL/PLATELET
BASOS PCT: 0 % (ref 0–1)
Basophils Absolute: 0 10*3/uL (ref 0.0–0.1)
Eosinophils Absolute: 0.4 10*3/uL (ref 0.0–0.7)
Eosinophils Relative: 5 % (ref 0–5)
HCT: 38.6 % (ref 36.0–46.0)
Hemoglobin: 11.3 g/dL — ABNORMAL LOW (ref 12.0–15.0)
Lymphocytes Relative: 19 % (ref 12–46)
Lymphs Abs: 1.5 10*3/uL (ref 0.7–4.0)
MCH: 28.3 pg (ref 26.0–34.0)
MCHC: 29.3 g/dL — AB (ref 30.0–36.0)
MCV: 96.7 fL (ref 78.0–100.0)
MONO ABS: 0.5 10*3/uL (ref 0.1–1.0)
Monocytes Relative: 6 % (ref 3–12)
NEUTROS ABS: 5.7 10*3/uL (ref 1.7–7.7)
Neutrophils Relative %: 70 % (ref 43–77)
Platelets: 286 10*3/uL (ref 150–400)
RBC: 3.99 MIL/uL (ref 3.87–5.11)
RDW: 15.5 % (ref 11.5–15.5)
WBC: 8.1 10*3/uL (ref 4.0–10.5)

## 2014-11-15 LAB — CBC
HEMATOCRIT: 36.5 % (ref 36.0–46.0)
HEMOGLOBIN: 10.5 g/dL — AB (ref 12.0–15.0)
MCH: 27.6 pg (ref 26.0–34.0)
MCHC: 28.8 g/dL — AB (ref 30.0–36.0)
MCV: 96.1 fL (ref 78.0–100.0)
Platelets: 251 10*3/uL (ref 150–400)
RBC: 3.8 MIL/uL — ABNORMAL LOW (ref 3.87–5.11)
RDW: 15.4 % (ref 11.5–15.5)
WBC: 9.8 10*3/uL (ref 4.0–10.5)

## 2014-11-15 LAB — BRAIN NATRIURETIC PEPTIDE: B NATRIURETIC PEPTIDE 5: 23.2 pg/mL (ref 0.0–100.0)

## 2014-11-15 LAB — D-DIMER, QUANTITATIVE (NOT AT ARMC): D DIMER QUANT: 1.55 ug{FEU}/mL — AB (ref 0.00–0.48)

## 2014-11-15 MED ORDER — IOHEXOL 350 MG/ML SOLN
100.0000 mL | Freq: Once | INTRAVENOUS | Status: AC | PRN
  Administered 2014-11-15: 100 mL via INTRAVENOUS

## 2014-11-15 MED ORDER — ALBUTEROL (5 MG/ML) CONTINUOUS INHALATION SOLN
10.0000 mg/h | INHALATION_SOLUTION | Freq: Once | RESPIRATORY_TRACT | Status: AC
  Administered 2014-11-15: 10 mg/h via RESPIRATORY_TRACT
  Filled 2014-11-15: qty 20

## 2014-11-15 MED ORDER — PREDNISONE 20 MG PO TABS
60.0000 mg | ORAL_TABLET | Freq: Once | ORAL | Status: AC
  Administered 2014-11-15: 60 mg via ORAL
  Filled 2014-11-15: qty 3

## 2014-11-15 MED ORDER — MORPHINE SULFATE (PF) 4 MG/ML IV SOLN
4.0000 mg | Freq: Once | INTRAVENOUS | Status: AC
  Administered 2014-11-15: 4 mg via INTRAVENOUS
  Filled 2014-11-15: qty 1

## 2014-11-15 NOTE — ED Notes (Signed)
Manual blood pressure taken and was 84/32. EDP notified. Pt taken to CT.

## 2014-11-15 NOTE — ED Notes (Addendum)
Pt reports leg swelling, SOB, knots on leg. Hx of CHF, asthma, HTN. Knots on her legs for the past 4-5 days. Ems gave pt neb tx at 2pm today, which provided some relief. Leg pain 10/10.   Pt arrived during downtime.  bloodwork ordered: CBC with diff, CMP, BNP, i stat trop

## 2014-11-15 NOTE — ED Provider Notes (Addendum)
CSN: [id number]     Arrival date & time 11/15/14  1539 History   First MD Initiated Contact with Patient 11/15/14 1940     Chief Complaint  Patient presents with  . Shortness of Breath  . Foot Swelling     (Consider location/radiation/quality/duration/timing/severity/associated sxs/prior Treatment) HPI Presents with shortness of breath and nonproductive cough for the past 4 days. She also reports bilateral painful legs for the past 2-3 days. Pain is worse when she puts pressure on her legs. Not improved by anything pain is severe. She denies fever. No other associated symptoms. She's been using her albuterol inhaler, without relief. No other associated symptoms Past Medical History  Diagnosis Date  . Asthma   . GERD (gastroesophageal reflux disease)   . Hypertension   . Homelessness   . Low back pain   . 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 Arthritis   . Gout   . Hyperlipemia   . Morbid obesity     uses motor wheel chair  . MRSA (methicillin resistant Staphylococcus aureus)     states about a year ago  . CHF (congestive heart failure)   . Chronic abdominal pain   . Hidradenitis   . Chronic pain     "all over"  . Renal insufficiency   . On home O2     3L N/C O2 continuously  . OSA (obstructive sleep apnea)     non-compliant with CPAP   Past Surgical History  Procedure Laterality Date  . Tubal ligation    . Cesarean section     Family History  Problem Relation Age of Onset  . Asthma Mother   . Diabetes Father   . Heart disease Father   . Stroke Father    Social History  Substance Use Topics  . Smoking status: Former Smoker    Types: Cigarettes    Quit date: 03/25/2003  . Smokeless tobacco: Former Systems developer    Quit date: 05/27/1978  . Alcohol Use: No   OB History    No data available     Review of Systems  Respiratory: Positive for cough and shortness of breath.   Cardiovascular: Positive for leg swelling.       Bilateral leg swelling for the  past 2-3 days  Musculoskeletal: Positive for myalgias.       Bilateral leg pain  Skin: Positive for rash.       "Bumps on legs"      Allergies  Ibuprofen; Adhesive; Azithromycin; Doxycycline; Penicillins; Sulfa antibiotics; and Sulfamethoxazole-trimethoprim  Home Medications   Prior to Admission medications   Medication Sig Start Date End Date Taking? Authorizing Provider  albuterol (PROVENTIL HFA;VENTOLIN HFA) 108 (90 BASE) MCG/ACT inhaler Inhale 2 puffs into the lungs every 4 (four) hours as needed for wheezing or shortness of breath. 09/22/14  Yes Francine Graven, DO  albuterol (PROVENTIL) (2.5 MG/3ML) 0.083% nebulizer solution Take 3 mLs (2.5 mg total) by nebulization every 4 (four) hours as needed for wheezing or shortness of breath. 09/22/14  Yes Francine Graven, DO  allopurinol (ZYLOPRIM) 100 MG tablet Take 1 tablet (100 mg total) by mouth every morning. 01/22/14  Yes Donne Hazel, MD  ALPRAZolam Duanne Moron) 1 MG tablet Take 1 mg by mouth at bedtime as needed for anxiety (anxiety).   Yes Historical Provider, MD  ammonium lactate (AMLACTIN) 12 % cream Apply 1 g topically daily as needed for dry skin. 04/22/14  Yes Linton Flemings, MD  aspirin  325 MG tablet Take 325 mg by mouth every morning.    Yes Historical Provider, MD  atorvastatin (LIPITOR) 20 MG tablet Take 1 tablet (20 mg total) by mouth every morning. 01/22/14  Yes Donne Hazel, MD  clotrimazole (LOTRIMIN) 1 % cream Apply to affected area 2 times daily 06/20/14  Yes Charlann Lange, PA-C  diphenhydrAMINE (BENADRYL) 25 mg capsule Take 25 mg by mouth every 6 (six) hours as needed for itching or allergies (allergies).    Yes Historical Provider, MD  Emollient (EUCERIN) lotion Apply 5 mLs topically 2 (two) times daily as needed for dry skin. Patient taking differently: Apply 5 mLs topically 3 (three) times daily.  04/22/14  Yes Linton Flemings, MD  folic acid (FOLVITE) 1 MG tablet Take 1 tablet (1 mg total) by mouth daily. 01/22/14  Yes Donne Hazel, MD  furosemide (LASIX) 40 MG tablet Take 1 tablet (40 mg total) by mouth 2 (two) times daily. 01/22/14  Yes Donne Hazel, MD  gabapentin (NEURONTIN) 300 MG capsule Take 300 mg by mouth 3 (three) times daily. 10/19/14  Yes Historical Provider, MD  guaiFENesin (MUCINEX) 600 MG 12 hr tablet Take 600 mg by mouth 2 (two) times daily.   Yes Historical Provider, MD  HYDROcodone-acetaminophen [facility]) 10-325 MG per tablet Take 1 tablet by mouth. 10/19/14  Yes Historical Provider, MD  hydrOXYzine (ATARAX/VISTARIL) 25 MG tablet Take 25 mg by mouth 3 (three) times daily as needed for anxiety or itching (anxiety and itching).    Yes Historical Provider, MD  ketoconazole (NIZORAL) 2 % shampoo Apply 1 application topically daily as needed for irritation (irritation).    Yes Historical Provider, MD  ketoconazole (NIZORAL) 200 MG tablet Take 200 mg by mouth every morning.    Yes Historical Provider, MD  lisinopril (PRINIVIL,ZESTRIL) 40 MG tablet Take 40 mg by mouth daily.   Yes Historical Provider, MD  Multiple Vitamin (MULTIVITAMIN WITH MINERALS) TABS tablet Take 1 tablet by mouth daily. 01/22/14  Yes Donne Hazel, MD  pantoprazole (PROTONIX) 40 MG tablet Take 1 tablet (40 mg total) by mouth every morning. 01/22/14  Yes Donne Hazel, MD  solifenacin (VESICARE) 5 MG tablet Take 5 mg by mouth every morning.    Yes Historical Provider, MD  vitamin B-12 (CYANOCOBALAMIN) 1000 MCG tablet Take 1 tablet (1,000 mcg total) by mouth every morning. 01/22/14  Yes Donne Hazel, MD  vitamin E 100 UNIT capsule Take 1 capsule (100 Units total) by mouth every morning. 01/22/14  Yes Donne Hazel, MD  cephALEXin (KEFLEX) 500 MG capsule Take 1 capsule (500 mg total) by mouth 4 (four) times daily. Patient not taking: Reported on 09/22/2014 06/13/14   Tatyana Kirichenko, PA-C  clindamycin (CLEOCIN) 150 MG capsule Take 1 capsule (150 mg total) by mouth 3 (three) times daily. Patient not taking: Reported on 06/19/2014 05/18/14   Tanna Furry, MD  HYDROcodone-acetaminophen (NORCO/VICODIN) 5-325 MG per tablet Take 2 tablets by mouth every 4 (four) hours as needed. Patient not taking: Reported on 11/15/2014 05/18/14   Tanna Furry, MD  ketorolac (TORADOL) 10 MG tablet Take 1 tablet (10 mg total) by mouth every 6 (six) hours as needed for severe pain. Patient not taking: Reported on 06/19/2014 01/23/14   Donne Hazel, MD  predniSONE (DELTASONE) 20 MG tablet Take 2 tablets (40 mg total) by mouth daily. Patient not taking: Reported on 11/15/2014 09/22/14   Francine Graven, DO  traMADol (ULTRAM) 50 MG tablet Take 1 tablet (  50 mg total) by mouth every 6 (six) hours as needed. Patient not taking: Reported on 11/15/2014 06/20/14   Charlann Lange, PA-C   BP 132/43 mmHg  Pulse 82  Temp(Src) 98 F (36.7 C) (Oral)  Resp 20  SpO2 95%  LMP 03/24/2009 Physical Exam  Constitutional: She appears well-developed and well-nourished. She appears distressed.  Appears uncomfortable acutely and chronically ill-appearing  HENT:  Head: Normocephalic and atraumatic.  Eyes: Conjunctivae are normal. Pupils are equal, round, and reactive to light.  Neck: Neck supple. No tracheal deviation present. No thyromegaly present.  Cardiovascular: Normal rate and regular rhythm.   No murmur heard. Pulmonary/Chest:  Coughing frequently. Expiratory wheezes..  Abdominal: Soft. Bowel sounds are normal. She exhibits no distension. There is no tenderness.  Morbidly obese  Musculoskeletal: Normal range of motion. She exhibits edema. She exhibits no tenderness.  Bilateral lower extremities minimally reddened, not warm. Neurovascularly intact. No rash. Neurovascularly intact  Neurological: She is alert. Coordination normal.  Skin: Skin is warm and dry. No rash noted.  Psychiatric: She has a normal mood and affect.  Nursing note and vitals reviewed.   ED Course  Procedures (including critical care time) Labs Review Labs Reviewed  CBC WITH DIFFERENTIAL/PLATELET -  Abnormal; Notable for the following:    Hemoglobin 11.3 (*)    MCHC 29.3 (*)    All other components within normal limits  COMPREHENSIVE METABOLIC PANEL - Abnormal; Notable for the following:    BUN 25 (*)    Creatinine, Ser 1.44 (*)    Calcium 8.2 (*)    GFR calc non Af Amer 42 (*)    GFR calc Af Amer 48 (*)    All other components within normal limits  BRAIN NATRIURETIC PEPTIDE    Imaging Review Dg Chest 2 View  11/15/2014   CLINICAL DATA:  Right-sided chest pain and shortness of breath. Lower extremity swelling.  EXAM: CHEST  2 VIEW  COMPARISON:  09/22/2014  FINDINGS: Low lung volumes are again noted. Cardiomegaly and pulmonary vascular congestion appears stable. No evidence of pulmonary consolidation or overt pulmonary edema. No evidence of pleural effusion.  IMPRESSION: Stable low lung volumes, with cardiomegaly and pulmonary vascular congestion. No acute findings.   Electronically Signed   By: Earle Gell M.D.   On: 11/15/2014 18:10   I have personally reviewed and evaluated these images and lab results as part of my medical decision-making.   EKG Interpretation   Date/Time:  Thursday November 16 2014 01:05:55 EDT Ventricular Rate:  87 PR Interval:  147 QRS Duration: 106 QT Interval:  370 QTC Calculation: 445 R Axis:   43 Text Interpretation:  Sinus rhythm Atrial premature complexes Low voltage,  precordial leads No significant change since last tracing Confirmed by  Winfred Leeds  MD, Casin Federici [phone]) on 11/16/2014 1:07:58 AM     Results for orders placed or performed during the hospital encounter of 11/15/14  CBC with Differential/Platelet  Result Value Ref Range   WBC 8.1 4.0 - 10.5 K/uL   RBC 3.99 3.87 - 5.11 MIL/uL   Hemoglobin 11.3 (L) 12.0 - 15.0 g/dL   HCT 38.6 36.0 - 46.0 %   MCV 96.7 78.0 - 100.0 fL   MCH 28.3 26.0 - 34.0 pg   MCHC 29.3 (L) 30.0 - 36.0 g/dL   RDW 15.5 11.5 - 15.5 %   Platelets 286 150 - 400 K/uL   Neutrophils Relative % 70 43 - 77 %   Neutro Abs  5.7 1.7 - 7.7  K/uL   Lymphocytes Relative 19 12 - 46 %   Lymphs Abs 1.5 0.7 - 4.0 K/uL   Monocytes Relative 6 3 - 12 %   Monocytes Absolute 0.5 0.1 - 1.0 K/uL   Eosinophils Relative 5 0 - 5 %   Eosinophils Absolute 0.4 0.0 - 0.7 K/uL   Basophils Relative 0 0 - 1 %   Basophils Absolute 0.0 0.0 - 0.1 K/uL  Comprehensive metabolic panel  Result Value Ref Range   Sodium 139 135 - 145 mmol/L   Potassium 3.9 3.5 - 5.1 mmol/L   Chloride 101 101 - 111 mmol/L   CO2 29 22 - 32 mmol/L   Glucose, Bld 96 65 - 99 mg/dL   BUN 25 (H) 6 - 20 mg/dL   Creatinine, Ser 1.44 (H) 0.44 - 1.00 mg/dL   Calcium 8.2 (L) 8.9 - 10.3 mg/dL   Total Protein 7.0 6.5 - 8.1 g/dL   Albumin 3.5 3.5 - 5.0 g/dL   AST 36 15 - 41 U/L   ALT 15 14 - 54 U/L   Alkaline Phosphatase 53 38 - 126 U/L   Total Bilirubin 0.5 0.3 - 1.2 mg/dL   GFR calc non Af Amer 42 (L) >60 mL/min   GFR calc Af Amer 48 (L) >60 mL/min   Anion gap 9 5 - 15  Brain natriuretic peptide  Result Value Ref Range   B Natriuretic Peptide 23.2 0.0 - 100.0 pg/mL  D-dimer, quantitative (not at [facility])  Result Value Ref Range   D-Dimer, Quant 1.55 (H) 0.00 - 0.48 ug/mL-FEU  Comprehensive metabolic panel  Result Value Ref Range   Sodium 142 135 - 145 mmol/L   Potassium 4.2 3.5 - 5.1 mmol/L   Chloride 105 101 - 111 mmol/L   CO2 31 22 - 32 mmol/L   Glucose, Bld 99 65 - 99 mg/dL   BUN 24 (H) 6 - 20 mg/dL   Creatinine, Ser 1.40 (H) 0.44 - 1.00 mg/dL   Calcium 8.2 (L) 8.9 - 10.3 mg/dL   Total Protein 6.5 6.5 - 8.1 g/dL   Albumin 3.2 (L) 3.5 - 5.0 g/dL   AST 33 15 - 41 U/L   ALT 16 14 - 54 U/L   Alkaline Phosphatase 49 38 - 126 U/L   Total Bilirubin 0.4 0.3 - 1.2 mg/dL   GFR calc non Af Amer 43 (L) >60 mL/min   GFR calc Af Amer 50 (L) >60 mL/min   Anion gap 6 5 - 15  CBC  Result Value Ref Range   WBC 9.8 4.0 - 10.5 K/uL   RBC 3.80 (L) 3.87 - 5.11 MIL/uL   Hemoglobin 10.5 (L) 12.0 - 15.0 g/dL   HCT 36.5 36.0 - 46.0 %   MCV 96.1 78.0 - 100.0 fL   MCH  27.6 26.0 - 34.0 pg   MCHC 28.8 (L) 30.0 - 36.0 g/dL   RDW 15.4 11.5 - 15.5 %   Platelets 251 150 - 400 K/uL   Dg Chest 2 View  11/15/2014   CLINICAL DATA:  Right-sided chest pain and shortness of breath. Lower extremity swelling.  EXAM: CHEST  2 VIEW  COMPARISON:  09/22/2014  FINDINGS: Low lung volumes are again noted. Cardiomegaly and pulmonary vascular congestion appears stable. No evidence of pulmonary consolidation or overt pulmonary edema. No evidence of pleural effusion.  IMPRESSION: Stable low lung volumes, with cardiomegaly and pulmonary vascular congestion. No acute findings.   Electronically Signed   By: Earle Gell  M.D.   On: 11/15/2014 18:10   Ct Angio Chest Pe W/cm &/or Wo Cm  11/15/2014   CLINICAL DATA:  Leg swelling and shortness of breath. Elevated D-dimer.  EXAM: CT ANGIOGRAPHY CHEST WITH CONTRAST  TECHNIQUE: Multidetector CT imaging of the chest was performed using the standard protocol during bolus administration of intravenous contrast. Multiplanar CT image reconstructions and MIPs were obtained to evaluate the vascular anatomy.  CONTRAST:  152mL OMNIPAQUE IOHEXOL 350 MG/ML SOLN  COMPARISON:  Radiographs earlier this day.  Chest CT 03/14/2012  FINDINGS: No central pulmonary embolus involving the main or lobar pulmonary arteries, technically fair/poor study. The segmental and subsegmental branches cannot be assessed due to contrast bolus timing and soft tissue attenuation from body habitus.  There is mild multi chamber cardiomegaly. No discrete right heart strain or contrast refluxing into the IVC. Thoracic aorta is normal in caliber with mild atherosclerosis. Scattered minimal coronary artery calcifications. There is no pleural or pericardial effusion. Prominent upper paratracheal lymph node measuring 10 mm short axis dimension, unchanged. There is a prominent prevascular lymph node measuring 9 mm short axis dimension, likely reactive. No definite hilar adenopathy.  Limited assessment  of lung parenchyma, no consolidation to suggest pneumonia. No dominant pulmonary mass. Mild dependent and lingular atelectasis.  No acute abnormality in the included upper abdomen. Probable hepatomegaly.  There are no acute or suspicious osseous abnormalities. There is degenerative change in the spine.  Review of the MIP images confirms the above findings.  IMPRESSION: 1. No central pulmonary embolus in the main or lobar pulmonary arteries. The segmental and subsegmental branches cannot be assessed. 2. No acute findings.  Cardiomegaly and scattered atelectasis.   Electronically Signed   By: Jeb Levering M.D.   On: 11/15/2014 23:42   1202 pt oxygen saturation dropped to 60's per RMN ,c/w severe hypoxia. She was palced on NRB mask   1230 pm requesting additional pain medication for leg pain. iv fentanyl ordered 1245  Pt slightly drowsy arousable to verbal stimulus.  bipap orederd  Bp improved after treatment with intravenous fluisds MDM  Patient with elevated d-dimer. We'll treat empirically with 1 dose Lovenox for DVT. She should have studies to rule out DVT tomorrow. Doubt cellulitis of leg. Legs are not hot, minimally reddened. Doubt sepsis. No leukocytosis or fever. Patient suffers from chronic pain Final diagnoses:  None   I spoke with Dr. Arnoldo Morale plan admit step down unit intravenous fluids and oxygen. Patient likely has hypoventilation syndrome secondary to obesity along with asthma Dx #1 acute dyspnea  #2 bilateral leg pain #3 renal insufficiency #4 hypoxia #5 hypotension #6 hypoxia  CRITICAL CARE Performed by: Orlie Dakin Total critical care time: 45 minute Critical care time was exclusive of separately billable procedures and treating other patients. Critical care was necessary to treat or prevent imminent or life-threatening deterioration. Critical care was time spent personally by me on the following activities: development of treatment plan with patient and/or surrogate as  well as nursing, discussions with consultants, evaluation of patient's response to treatment, examination of patient, obtaining history from patient or surrogate, ordering and performing treatments and interventions, ordering and review of laboratory studies, ordering and review of radiographic studies, pulse oximetry and re-evaluation of patient's condition.    Orlie Dakin, MD 11/16/14 [id number]  Orlie Dakin, MD 11/16/14 [id number]

## 2014-11-16 ENCOUNTER — Other Ambulatory Visit (HOSPITAL_COMMUNITY): Payer: Medicaid Other

## 2014-11-16 ENCOUNTER — Inpatient Hospital Stay (HOSPITAL_COMMUNITY): Payer: Medicaid Other

## 2014-11-16 DIAGNOSIS — R609 Edema, unspecified: Secondary | ICD-10-CM

## 2014-11-16 DIAGNOSIS — I959 Hypotension, unspecified: Secondary | ICD-10-CM | POA: Diagnosis present

## 2014-11-16 DIAGNOSIS — J208 Acute bronchitis due to other specified organisms: Secondary | ICD-10-CM | POA: Diagnosis not present

## 2014-11-16 DIAGNOSIS — E662 Morbid (severe) obesity with alveolar hypoventilation: Secondary | ICD-10-CM | POA: Diagnosis not present

## 2014-11-16 DIAGNOSIS — J9622 Acute and chronic respiratory failure with hypercapnia: Secondary | ICD-10-CM | POA: Diagnosis present

## 2014-11-16 DIAGNOSIS — K5900 Constipation, unspecified: Secondary | ICD-10-CM | POA: Diagnosis present

## 2014-11-16 DIAGNOSIS — I5032 Chronic diastolic (congestive) heart failure: Secondary | ICD-10-CM

## 2014-11-16 DIAGNOSIS — K219 Gastro-esophageal reflux disease without esophagitis: Secondary | ICD-10-CM | POA: Diagnosis present

## 2014-11-16 DIAGNOSIS — Z8249 Family history of ischemic heart disease and other diseases of the circulatory system: Secondary | ICD-10-CM | POA: Diagnosis not present

## 2014-11-16 DIAGNOSIS — I129 Hypertensive chronic kidney disease with stage 1 through stage 4 chronic kidney disease, or unspecified chronic kidney disease: Secondary | ICD-10-CM | POA: Diagnosis present

## 2014-11-16 DIAGNOSIS — I878 Other specified disorders of veins: Secondary | ICD-10-CM | POA: Diagnosis present

## 2014-11-16 DIAGNOSIS — Z825 Family history of asthma and other chronic lower respiratory diseases: Secondary | ICD-10-CM | POA: Diagnosis not present

## 2014-11-16 DIAGNOSIS — G4733 Obstructive sleep apnea (adult) (pediatric): Secondary | ICD-10-CM | POA: Diagnosis not present

## 2014-11-16 DIAGNOSIS — Z9119 Patient's noncompliance with other medical treatment and regimen: Secondary | ICD-10-CM | POA: Diagnosis present

## 2014-11-16 DIAGNOSIS — Z8614 Personal history of Methicillin resistant Staphylococcus aureus infection: Secondary | ICD-10-CM | POA: Diagnosis not present

## 2014-11-16 DIAGNOSIS — Z7982 Long term (current) use of aspirin: Secondary | ICD-10-CM | POA: Diagnosis not present

## 2014-11-16 DIAGNOSIS — J45901 Unspecified asthma with (acute) exacerbation: Principal | ICD-10-CM | POA: Diagnosis present

## 2014-11-16 DIAGNOSIS — R0602 Shortness of breath: Secondary | ICD-10-CM | POA: Diagnosis present

## 2014-11-16 DIAGNOSIS — J209 Acute bronchitis, unspecified: Secondary | ICD-10-CM | POA: Diagnosis not present

## 2014-11-16 DIAGNOSIS — M109 Gout, unspecified: Secondary | ICD-10-CM | POA: Diagnosis not present

## 2014-11-16 DIAGNOSIS — N189 Chronic kidney disease, unspecified: Secondary | ICD-10-CM

## 2014-11-16 DIAGNOSIS — E785 Hyperlipidemia, unspecified: Secondary | ICD-10-CM | POA: Diagnosis present

## 2014-11-16 DIAGNOSIS — Q828 Other specified congenital malformations of skin: Secondary | ICD-10-CM | POA: Diagnosis not present

## 2014-11-16 DIAGNOSIS — Z823 Family history of stroke: Secondary | ICD-10-CM | POA: Diagnosis not present

## 2014-11-16 DIAGNOSIS — I509 Heart failure, unspecified: Secondary | ICD-10-CM | POA: Diagnosis not present

## 2014-11-16 DIAGNOSIS — I10 Essential (primary) hypertension: Secondary | ICD-10-CM | POA: Diagnosis not present

## 2014-11-16 DIAGNOSIS — Z7952 Long term (current) use of systemic steroids: Secondary | ICD-10-CM | POA: Diagnosis not present

## 2014-11-16 DIAGNOSIS — I9589 Other hypotension: Secondary | ICD-10-CM

## 2014-11-16 DIAGNOSIS — Z9981 Dependence on supplemental oxygen: Secondary | ICD-10-CM | POA: Diagnosis not present

## 2014-11-16 DIAGNOSIS — Z880 Allergy status to penicillin: Secondary | ICD-10-CM | POA: Diagnosis not present

## 2014-11-16 DIAGNOSIS — Z872 Personal history of diseases of the skin and subcutaneous tissue: Secondary | ICD-10-CM | POA: Diagnosis not present

## 2014-11-16 DIAGNOSIS — M1990 Unspecified osteoarthritis, unspecified site: Secondary | ICD-10-CM | POA: Diagnosis not present

## 2014-11-16 DIAGNOSIS — Z87891 Personal history of nicotine dependence: Secondary | ICD-10-CM | POA: Diagnosis not present

## 2014-11-16 DIAGNOSIS — J9621 Acute and chronic respiratory failure with hypoxia: Secondary | ICD-10-CM | POA: Diagnosis not present

## 2014-11-16 DIAGNOSIS — Z590 Homelessness: Secondary | ICD-10-CM | POA: Diagnosis not present

## 2014-11-16 DIAGNOSIS — N179 Acute kidney failure, unspecified: Secondary | ICD-10-CM

## 2014-11-16 DIAGNOSIS — N183 Chronic kidney disease, stage 3 (moderate): Secondary | ICD-10-CM | POA: Diagnosis not present

## 2014-11-16 DIAGNOSIS — Z6841 Body Mass Index (BMI) 40.0 and over, adult: Secondary | ICD-10-CM | POA: Diagnosis not present

## 2014-11-16 DIAGNOSIS — L989 Disorder of the skin and subcutaneous tissue, unspecified: Secondary | ICD-10-CM | POA: Diagnosis present

## 2014-11-16 DIAGNOSIS — R600 Localized edema: Secondary | ICD-10-CM | POA: Diagnosis not present

## 2014-11-16 DIAGNOSIS — Z87448 Personal history of other diseases of urinary system: Secondary | ICD-10-CM | POA: Diagnosis not present

## 2014-11-16 DIAGNOSIS — R0600 Dyspnea, unspecified: Secondary | ICD-10-CM | POA: Diagnosis not present

## 2014-11-16 DIAGNOSIS — M79669 Pain in unspecified lower leg: Secondary | ICD-10-CM | POA: Diagnosis present

## 2014-11-16 DIAGNOSIS — Z833 Family history of diabetes mellitus: Secondary | ICD-10-CM | POA: Diagnosis not present

## 2014-11-16 DIAGNOSIS — E872 Acidosis: Secondary | ICD-10-CM | POA: Diagnosis present

## 2014-11-16 DIAGNOSIS — Z79899 Other long term (current) drug therapy: Secondary | ICD-10-CM | POA: Diagnosis not present

## 2014-11-16 DIAGNOSIS — G8929 Other chronic pain: Secondary | ICD-10-CM | POA: Diagnosis present

## 2014-11-16 DIAGNOSIS — J9610 Chronic respiratory failure, unspecified whether with hypoxia or hypercapnia: Secondary | ICD-10-CM | POA: Diagnosis not present

## 2014-11-16 DIAGNOSIS — I5030 Unspecified diastolic (congestive) heart failure: Secondary | ICD-10-CM | POA: Diagnosis present

## 2014-11-16 DIAGNOSIS — E875 Hyperkalemia: Secondary | ICD-10-CM | POA: Diagnosis present

## 2014-11-16 DIAGNOSIS — I272 Other secondary pulmonary hypertension: Secondary | ICD-10-CM | POA: Diagnosis present

## 2014-11-16 LAB — TROPONIN I
Troponin I: <0.03 ng/mL (ref <0.031)
Troponin I: <0.03 ng/mL (ref <0.031)

## 2014-11-16 LAB — BASIC METABOLIC PANEL
Anion gap: 5 (ref 5–15)
BUN: 20 mg/dL (ref 6–20)
CO2: 29 mmol/L (ref 22–32)
Calcium: 7.9 mg/dL — ABNORMAL LOW (ref 8.9–10.3)
Chloride: 105 mmol/L (ref 101–111)
Creatinine, Ser: 1.26 mg/dL — ABNORMAL HIGH (ref 0.44–1.00)
GFR calc Af Amer: 57 mL/min — ABNORMAL LOW (ref >60)
GFR, EST NON AFRICAN AMERICAN: 49 mL/min — AB (ref >60)
GLUCOSE: 163 mg/dL — AB (ref 65–99)
POTASSIUM: 4.7 mmol/L (ref 3.5–5.1)
Sodium: 139 mmol/L (ref 135–145)

## 2014-11-16 LAB — CBC
HEMATOCRIT: 37.8 % (ref 36.0–46.0)
Hemoglobin: 11 g/dL — ABNORMAL LOW (ref 12.0–15.0)
MCH: 28.4 pg (ref 26.0–34.0)
MCHC: 29.1 g/dL — AB (ref 30.0–36.0)
MCV: 97.7 fL (ref 78.0–100.0)
Platelets: 236 10*3/uL (ref 150–400)
RBC: 3.87 MIL/uL (ref 3.87–5.11)
RDW: 15.8 % — AB (ref 11.5–15.5)
WBC: 7.4 10*3/uL (ref 4.0–10.5)

## 2014-11-16 LAB — MRSA PCR SCREENING: MRSA BY PCR: POSITIVE — AB

## 2014-11-16 MED ORDER — ACETAMINOPHEN 325 MG PO TABS
650.0000 mg | ORAL_TABLET | Freq: Four times a day (QID) | ORAL | Status: DC | PRN
  Administered 2014-11-17 – 2014-11-21 (×9): 650 mg via ORAL
  Filled 2014-11-16 (×10): qty 2

## 2014-11-16 MED ORDER — SODIUM CHLORIDE 0.9 % IJ SOLN: 3.0000 mL | INTRAMUSCULAR | Status: DC | PRN

## 2014-11-16 MED ORDER — PREDNISONE 20 MG PO TABS
40.0000 mg | ORAL_TABLET | Freq: Every day | ORAL | Status: DC
  Administered 2014-11-17 – 2014-11-20 (×4): 40 mg via ORAL
  Filled 2014-11-16 (×4): qty 2

## 2014-11-16 MED ORDER — LEVOFLOXACIN 500 MG PO TABS
500.0000 mg | ORAL_TABLET | Freq: Every day | ORAL | Status: DC
Start: 2014-11-16 — End: 2014-11-19
  Administered 2014-11-16 – 2014-11-18 (×3): 500 mg via ORAL
  Filled 2014-11-16 (×3): qty 1

## 2014-11-16 MED ORDER — IPRATROPIUM-ALBUTEROL 0.5-2.5 (3) MG/3ML IN SOLN
3.0000 mL | RESPIRATORY_TRACT | Status: DC
  Administered 2014-11-16: 3 mL via RESPIRATORY_TRACT
  Filled 2014-11-16: qty 3

## 2014-11-16 MED ORDER — ACETAMINOPHEN 650 MG RE SUPP: 650.0000 mg | Freq: Four times a day (QID) | RECTAL | Status: DC | PRN

## 2014-11-16 MED ORDER — HYDROMORPHONE HCL 1 MG/ML IJ SOLN
0.5000 mg | INTRAMUSCULAR | Status: DC | PRN
  Administered 2014-11-16: 0.5 mg via INTRAVENOUS
  Administered 2014-11-16 – 2014-11-17 (×4): 1 mg via INTRAVENOUS
  Filled 2014-11-16 (×5): qty 1

## 2014-11-16 MED ORDER — SODIUM CHLORIDE 0.9 % IV SOLN: 250.0000 mL | INTRAVENOUS | Status: DC | PRN

## 2014-11-16 MED ORDER — ATORVASTATIN CALCIUM 10 MG PO TABS
20.0000 mg | ORAL_TABLET | Freq: Every morning | ORAL | Status: DC
Start: 2014-11-16 — End: 2014-11-21
  Administered 2014-11-16 – 2014-11-21 (×6): 20 mg via ORAL
  Filled 2014-11-16 (×6): qty 2

## 2014-11-16 MED ORDER — EUCERIN EX LOTN: 5.0000 mL | TOPICAL_LOTION | Freq: Three times a day (TID) | CUTANEOUS | Status: DC

## 2014-11-16 MED ORDER — IPRATROPIUM-ALBUTEROL 0.5-2.5 (3) MG/3ML IN SOLN
3.0000 mL | Freq: Four times a day (QID) | RESPIRATORY_TRACT | Status: DC
  Administered 2014-11-16 – 2014-11-21 (×19): 3 mL via RESPIRATORY_TRACT
  Filled 2014-11-16 (×21): qty 3

## 2014-11-16 MED ORDER — ALUM & MAG HYDROXIDE-SIMETH 200-200-20 MG/5ML PO SUSP: 30.0000 mL | Freq: Four times a day (QID) | ORAL | Status: DC | PRN

## 2014-11-16 MED ORDER — ONDANSETRON HCL 4 MG/2ML IJ SOLN
4.0000 mg | Freq: Four times a day (QID) | INTRAMUSCULAR | Status: DC | PRN
  Administered 2014-11-17 (×2): 4 mg via INTRAVENOUS
  Filled 2014-11-16 (×2): qty 2

## 2014-11-16 MED ORDER — ONDANSETRON HCL 4 MG PO TABS
4.0000 mg | ORAL_TABLET | Freq: Four times a day (QID) | ORAL | Status: DC | PRN
  Administered 2014-11-19: 4 mg via ORAL
  Filled 2014-11-16: qty 1

## 2014-11-16 MED ORDER — ALPRAZOLAM 1 MG PO TABS
1.0000 mg | ORAL_TABLET | Freq: Every evening | ORAL | Status: DC | PRN
  Administered 2014-11-17: 1 mg via ORAL
  Filled 2014-11-16: qty 1

## 2014-11-16 MED ORDER — GUAIFENESIN ER 600 MG PO TB12
600.0000 mg | ORAL_TABLET | Freq: Two times a day (BID) | ORAL | Status: DC
  Administered 2014-11-16 – 2014-11-20 (×8): 600 mg via ORAL
  Filled 2014-11-16 (×8): qty 1

## 2014-11-16 MED ORDER — DIPHENHYDRAMINE HCL 25 MG PO CAPS
25.0000 mg | ORAL_CAPSULE | Freq: Four times a day (QID) | ORAL | Status: DC | PRN
  Administered 2014-11-16 (×2): 25 mg via ORAL
  Filled 2014-11-16 (×2): qty 1

## 2014-11-16 MED ORDER — FENTANYL CITRATE (PF) 100 MCG/2ML IJ SOLN
100.0000 ug | Freq: Once | INTRAMUSCULAR | Status: AC
  Administered 2014-11-16: 100 ug via INTRAVENOUS
  Filled 2014-11-16: qty 2

## 2014-11-16 MED ORDER — ASPIRIN 325 MG PO TABS
325.0000 mg | ORAL_TABLET | Freq: Every morning | ORAL | Status: DC
  Administered 2014-11-16 – 2014-11-21 (×6): 325 mg via ORAL
  Filled 2014-11-16 (×6): qty 1

## 2014-11-16 MED ORDER — ENOXAPARIN SODIUM 40 MG/0.4ML ~~LOC~~ SOLN
40.0000 mg | SUBCUTANEOUS | Status: DC
  Administered 2014-11-16: 40 mg via SUBCUTANEOUS
  Filled 2014-11-16: qty 0.4

## 2014-11-16 MED ORDER — DARIFENACIN HYDROBROMIDE ER 7.5 MG PO TB24
7.5000 mg | ORAL_TABLET | Freq: Every day | ORAL | Status: DC
  Administered 2014-11-16 – 2014-11-21 (×6): 7.5 mg via ORAL
  Filled 2014-11-16 (×7): qty 1

## 2014-11-16 MED ORDER — ENOXAPARIN SODIUM 150 MG/ML ~~LOC~~ SOLN
1.0000 mg/kg | Freq: Once | SUBCUTANEOUS | Status: DC
  Filled 2014-11-16: qty 2

## 2014-11-16 MED ORDER — AMMONIUM LACTATE 12 % EX LOTN
1.0000 "application " | TOPICAL_LOTION | Freq: Every day | CUTANEOUS | Status: DC | PRN
  Filled 2014-11-16: qty 400

## 2014-11-16 MED ORDER — ALLOPURINOL 100 MG PO TABS
100.0000 mg | ORAL_TABLET | Freq: Every morning | ORAL | Status: DC
  Administered 2014-11-16 – 2014-11-21 (×6): 100 mg via ORAL
  Filled 2014-11-16 (×6): qty 1

## 2014-11-16 MED ORDER — METHYLPREDNISOLONE SODIUM SUCC 125 MG IJ SOLR
125.0000 mg | Freq: Once | INTRAMUSCULAR | Status: AC
  Administered 2014-11-16: 125 mg via INTRAVENOUS
  Filled 2014-11-16: qty 2

## 2014-11-16 MED ORDER — GABAPENTIN 300 MG PO CAPS
300.0000 mg | ORAL_CAPSULE | Freq: Three times a day (TID) | ORAL | Status: DC
  Administered 2014-11-16 – 2014-11-17 (×3): 300 mg via ORAL
  Filled 2014-11-16 (×3): qty 1

## 2014-11-16 MED ORDER — SODIUM CHLORIDE 0.9 % IJ SOLN
3.0000 mL | Freq: Two times a day (BID) | INTRAMUSCULAR | Status: DC
  Administered 2014-11-16 – 2014-11-21 (×10): 3 mL via INTRAVENOUS

## 2014-11-16 MED ORDER — HYDROCERIN EX CREA
TOPICAL_CREAM | Freq: Three times a day (TID) | CUTANEOUS | Status: DC
  Administered 2014-11-16 (×2): via TOPICAL
  Administered 2014-11-17: 1 via TOPICAL
  Administered 2014-11-17 – 2014-11-21 (×12): via TOPICAL
  Filled 2014-11-16 (×2): qty 113

## 2014-11-16 NOTE — H&P (Addendum)
[facility] Admission History and Physical       Kara Vincent [id number] DOB: 1965/09/17 DOA: 11/15/2014  Referring physician: EDP PCP: Harvie Junior, MD  Specialists:   Chief Complaint: SOB, Lower Leg Pain and Swelling and Right Sided Chest pain  HPI: Kara Vincent is a 49 y.o. female with a history of Asthma, Chronic Respiratory Failure on NCO2 at  3 liters continuously, Diastolic CHF, HTN, Darier's Disease, and Hyperlipidemia who presents to the ED with complaints of increased SOB and right sided chest pain  X 2-3 days .  She also has had increased pain and swelling of both of her legs.   In the ED she was found to have hypoxia with O2 saturations of 70% and was placed on 100% NRB  O2 and was then  placed on BiPAP.  A CTA of the Chest was performed and was negative for Pulmonary Emboli.   She was administered IV Morphine for her leg pain and became hypotensive and was given a fluid challenge and improved.      Review of Systems:  Constitutional: No Weight Loss, No Weight Gain, Night Sweats, Fevers, Chills, Dizziness, Light Headedness, Fatigue, or Generalized Weakness HEENT: No Headaches, Difficulty Swallowing,Tooth/Dental Problems,Sore Throat,  No Sneezing, Rhinitis, Ear Ache, Nasal Congestion, or Post Nasal Drip,  Cardio-vascular:  No Chest pain, Orthopnea, PND, +Edema in Lower Extremities, Anasarca, Dizziness, Palpitations  Resp: +Dyspnea, No DOE, No Productive Cough, No Non-Productive Cough, No Hemoptysis, No Wheezing.    GI: No Heartburn, Indigestion, Abdominal Pain, Nausea, Vomiting, Diarrhea, Constipation, Hematemesis, Hematochezia, Melena, Change in Bowel Habits,  Loss of Appetite  GU: No Dysuria, No Change in Color of Urine, No Urgency or Urinary Frequency, No Flank pain.  Musculoskeletal: +Pain in Both Legs, Decreased Range of Motion, No Back Pain.  Neurologic: No Syncope, No Seizures, Muscle Weakness, Paresthesia, Vision Disturbance or Loss, No Diplopia,  No Vertigo, +Difficulty Walking,  Skin: Chronic Lesions on Legs arms and Torso.  Psych: No Change in Mood or Affect, No Depression or Anxiety, No Memory loss, No Confusion, or Hallucinations   Past Medical History  Diagnosis Date  . Asthma   . GERD (gastroesophageal reflux disease)   . Hypertension   . Homelessness   . Low back pain   . 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 Arthritis   . Gout   . Hyperlipemia   . Morbid obesity     uses motor wheel chair  . MRSA (methicillin resistant Staphylococcus aureus)     states about a year ago  . CHF (congestive heart failure)   . Chronic abdominal pain   . Hidradenitis   . Chronic pain     "all over"  . Renal insufficiency   . On home O2     3L N/C O2 continuously  . OSA (obstructive sleep apnea)     non-compliant with CPAP     Past Surgical History  Procedure Laterality Date  . Tubal ligation    . Cesarean section        Prior to Admission medications   Medication Sig Start Date End Date Taking? Authorizing Provider  albuterol (PROVENTIL HFA;VENTOLIN HFA) 108 (90 BASE) MCG/ACT inhaler Inhale 2 puffs into the lungs every 4 (four) hours as needed for wheezing or shortness of breath. 09/22/14  Yes Francine Graven, DO  albuterol (PROVENTIL) (2.5 MG/3ML) 0.083% nebulizer solution Take 3 mLs (2.5 mg total) by nebulization every 4 (four) hours as needed  for wheezing or shortness of breath. 09/22/14  Yes Francine Graven, DO  allopurinol (ZYLOPRIM) 100 MG tablet Take 1 tablet (100 mg total) by mouth every morning. 01/22/14  Yes Donne Hazel, MD  ALPRAZolam Duanne Moron) 1 MG tablet Take 1 mg by mouth at bedtime as needed for anxiety (anxiety).   Yes Historical Provider, MD  ammonium lactate (AMLACTIN) 12 % cream Apply 1 g topically daily as needed for dry skin. 04/22/14  Yes Linton Flemings, MD  aspirin 325 MG tablet Take 325 mg by mouth every morning.    Yes Historical Provider, MD  atorvastatin (LIPITOR) 20 MG tablet Take 1 tablet  (20 mg total) by mouth every morning. 01/22/14  Yes Donne Hazel, MD  clotrimazole (LOTRIMIN) 1 % cream Apply to affected area 2 times daily 06/20/14  Yes Charlann Lange, PA-C  diphenhydrAMINE (BENADRYL) 25 mg capsule Take 25 mg by mouth every 6 (six) hours as needed for itching or allergies (allergies).    Yes Historical Provider, MD  Emollient (EUCERIN) lotion Apply 5 mLs topically 2 (two) times daily as needed for dry skin. Patient taking differently: Apply 5 mLs topically 3 (three) times daily.  04/22/14  Yes Linton Flemings, MD  folic acid (FOLVITE) 1 MG tablet Take 1 tablet (1 mg total) by mouth daily. 01/22/14  Yes Donne Hazel, MD  furosemide (LASIX) 40 MG tablet Take 1 tablet (40 mg total) by mouth 2 (two) times daily. 01/22/14  Yes Donne Hazel, MD  gabapentin (NEURONTIN) 300 MG capsule Take 300 mg by mouth 3 (three) times daily. 10/19/14  Yes Historical Provider, MD  guaiFENesin (MUCINEX) 600 MG 12 hr tablet Take 600 mg by mouth 2 (two) times daily.   Yes Historical Provider, MD  HYDROcodone-acetaminophen [facility]) 10-325 MG per tablet Take 1 tablet by mouth. 10/19/14  Yes Historical Provider, MD  hydrOXYzine (ATARAX/VISTARIL) 25 MG tablet Take 25 mg by mouth 3 (three) times daily as needed for anxiety or itching (anxiety and itching).    Yes Historical Provider, MD  ketoconazole (NIZORAL) 2 % shampoo Apply 1 application topically daily as needed for irritation (irritation).    Yes Historical Provider, MD  ketoconazole (NIZORAL) 200 MG tablet Take 200 mg by mouth every morning.    Yes Historical Provider, MD  lisinopril (PRINIVIL,ZESTRIL) 40 MG tablet Take 40 mg by mouth daily.   Yes Historical Provider, MD  Multiple Vitamin (MULTIVITAMIN WITH MINERALS) TABS tablet Take 1 tablet by mouth daily. 01/22/14  Yes Donne Hazel, MD  pantoprazole (PROTONIX) 40 MG tablet Take 1 tablet (40 mg total) by mouth every morning. 01/22/14  Yes Donne Hazel, MD  solifenacin (VESICARE) 5 MG tablet Take 5 mg by  mouth every morning.    Yes Historical Provider, MD  vitamin B-12 (CYANOCOBALAMIN) 1000 MCG tablet Take 1 tablet (1,000 mcg total) by mouth every morning. 01/22/14  Yes Donne Hazel, MD  vitamin E 100 UNIT capsule Take 1 capsule (100 Units total) by mouth every morning. 01/22/14  Yes Donne Hazel, MD  cephALEXin (KEFLEX) 500 MG capsule Take 1 capsule (500 mg total) by mouth 4 (four) times daily. Patient not taking: Reported on 09/22/2014 06/13/14   Tatyana Kirichenko, PA-C  clindamycin (CLEOCIN) 150 MG capsule Take 1 capsule (150 mg total) by mouth 3 (three) times daily. Patient not taking: Reported on 06/19/2014 05/18/14   Tanna Furry, MD  HYDROcodone-acetaminophen (NORCO/VICODIN) 5-325 MG per tablet Take 2 tablets by mouth every 4 (four) hours as needed.  Patient not taking: Reported on 11/15/2014 05/18/14   Tanna Furry, MD  ketorolac (TORADOL) 10 MG tablet Take 1 tablet (10 mg total) by mouth every 6 (six) hours as needed for severe pain. Patient not taking: Reported on 06/19/2014 01/23/14   Donne Hazel, MD  predniSONE (DELTASONE) 20 MG tablet Take 2 tablets (40 mg total) by mouth daily. Patient not taking: Reported on 11/15/2014 09/22/14   Francine Graven, DO  traMADol (ULTRAM) 50 MG tablet Take 1 tablet (50 mg total) by mouth every 6 (six) hours as needed. Patient not taking: Reported on 11/15/2014 06/20/14   Charlann Lange, PA-C     Allergies  Allergen Reactions  . Ibuprofen Hives, Shortness Of Breath and Palpitations  . Adhesive [Tape] Rash  . Azithromycin Hives and Rash  . Doxycycline Rash  . Penicillins Rash  . Sulfa Antibiotics Rash  . Sulfamethoxazole-Trimethoprim Rash    Social History:  reports that she quit smoking about 11 years ago. Her smoking use included Cigarettes. She quit smokeless tobacco use about 36 years ago. She reports that she does not drink alcohol or use illicit drugs.    Family History  Problem Relation Age of Onset  . Asthma Mother   . Diabetes Father   . Heart  disease Father   . Stroke Father        Physical Exam: GEN:  Pleasant  Morbidly Obese 49 y.o.  Caucasian female examined and in no acute distress; cooperative with exam Filed Vitals:   11/15/14 2003 11/15/14 2027 11/15/14 2057 11/15/14 2252  BP:   92/43   Pulse:   83   Temp:      TempSrc:      Resp:   17   Weight:    187.789 kg (414 lb)  SpO2: 100% 100% 100%    Blood pressure 92/43, pulse 83, temperature 98 F (36.7 C), temperature source Oral, resp. rate 17, weight 187.789 kg (414 lb), last menstrual period 03/24/2009, SpO2 100 %. PSYCH: She is alert and oriented x4; does not appear anxious does not appear depressed; affect is normal HEENT: Normocephalic and Atraumatic, Mucous membranes pink; PERRLA; EOM intact; Fundi:  Benign;  No scleral icterus, Nares: Patent, Oropharynx: Clear, Fair Dentition,    Neck:  FROM, No Cervical Lymphadenopathy nor Thyromegaly or Carotid Bruit; No JVD; Breasts:: Not examined CHEST WALL: No tenderness CHEST: Normal respiration, clear to auscultation bilaterally HEART: Regular rate and rhythm; no murmurs rubs or gallops BACK: No kyphosis or scoliosis; No CVA tenderness ABDOMEN: Positive Bowel Sounds, Obese, Soft Non-Tender, No Rebound or Guarding; No Masses, No Organomegaly, +Pannus. Rectal Exam: Not done EXTREMITIES: No Cyanosis, Clubbing, +Edema BLEs; No Ulcerations. Genitalia: not examined PULSES: 2+ and symmetric SKIN: Normal hydration,  Dyskeratotic Follicular lesions on Legs , Thorax, and Arms CNS:  Alert and Oriented x 4, No Focal Deficits Vascular: pulses palpable throughout    Labs on Admission:  Basic Metabolic Panel:  Recent Labs Lab 11/15/14 1632 11/15/14 2023  NA 139 142  K 3.9 4.2  CL 101 105  CO2 29 31  GLUCOSE 96 99  BUN 25* 24*  CREATININE 1.44* 1.40*  CALCIUM 8.2* 8.2*   Liver Function Tests:  Recent Labs Lab 11/15/14 1632 11/15/14 2023  AST 36 33  ALT 15 16  ALKPHOS 53 49  BILITOT 0.5 0.4  PROT 7.0 6.5    ALBUMIN 3.5 3.2*   No results for input(s): LIPASE, AMYLASE in the last 168 hours. No results for input(s): AMMONIA in  the last 168 hours. CBC:  Recent Labs Lab 11/15/14 1632 11/15/14 2023  WBC 8.1 9.8  NEUTROABS 5.7  --   HGB 11.3* 10.5*  HCT 38.6 36.5  MCV 96.7 96.1  PLT 286 251   Cardiac Enzymes: No results for input(s): CKTOTAL, CKMB, CKMBINDEX, TROPONINI in the last 168 hours.  BNP (last 3 results)  Recent Labs  07/08/14 0132 09/22/14 1628 11/15/14 1738  BNP 108.9* 106.9* 23.2    ProBNP (last 3 results)  Recent Labs  01/13/14 2108  PROBNP 541.0*    CBG: No results for input(s): GLUCAP in the last 168 hours.  Radiological Exams on Admission: Dg Chest 2 View  11/15/2014   CLINICAL DATA:  Right-sided chest pain and shortness of breath. Lower extremity swelling.  EXAM: CHEST  2 VIEW  COMPARISON:  09/22/2014  FINDINGS: Low lung volumes are again noted. Cardiomegaly and pulmonary vascular congestion appears stable. No evidence of pulmonary consolidation or overt pulmonary edema. No evidence of pleural effusion.  IMPRESSION: Stable low lung volumes, with cardiomegaly and pulmonary vascular congestion. No acute findings.   Electronically Signed   By: Earle Gell M.D.   On: 11/15/2014 18:10   Ct Angio Chest Pe W/cm &/or Wo Cm  11/15/2014   CLINICAL DATA:  Leg swelling and shortness of breath. Elevated D-dimer.  EXAM: CT ANGIOGRAPHY CHEST WITH CONTRAST  TECHNIQUE: Multidetector CT imaging of the chest was performed using the standard protocol during bolus administration of intravenous contrast. Multiplanar CT image reconstructions and MIPs were obtained to evaluate the vascular anatomy.  CONTRAST:  134mL OMNIPAQUE IOHEXOL 350 MG/ML SOLN  COMPARISON:  Radiographs earlier this day.  Chest CT 03/14/2012  FINDINGS: No central pulmonary embolus involving the main or lobar pulmonary arteries, technically fair/poor study. The segmental and subsegmental branches cannot be assessed  due to contrast bolus timing and soft tissue attenuation from body habitus.  There is mild multi chamber cardiomegaly. No discrete right heart strain or contrast refluxing into the IVC. Thoracic aorta is normal in caliber with mild atherosclerosis. Scattered minimal coronary artery calcifications. There is no pleural or pericardial effusion. Prominent upper paratracheal lymph node measuring 10 mm short axis dimension, unchanged. There is a prominent prevascular lymph node measuring 9 mm short axis dimension, likely reactive. No definite hilar adenopathy.  Limited assessment of lung parenchyma, no consolidation to suggest pneumonia. No dominant pulmonary mass. Mild dependent and lingular atelectasis.  No acute abnormality in the included upper abdomen. Probable hepatomegaly.  There are no acute or suspicious osseous abnormalities. There is degenerative change in the spine.  Review of the MIP images confirms the above findings.  IMPRESSION: 1. No central pulmonary embolus in the main or lobar pulmonary arteries. The segmental and subsegmental branches cannot be assessed. 2. No acute findings.  Cardiomegaly and scattered atelectasis.   Electronically Signed   By: Jeb Levering M.D.   On: 11/15/2014 23:42     EKG: Independently reviewed. Normal Sinus Rhythm rate = 87   Assessment/Plan:   49 y.o. female with  Principal Problem:   1.    Asthma exacerbation   DuoNeb   IV Steroid Taper   BiPAP/O2   Monitor O2 sats   Active Problems:   2.    Acute on chronic respiratory failure with hypoxia   BiPAP    Monitor O2 sats     3.    Hypotension- due to Morphine given vs early Sepsis   Improved with fluid Bolus   Hold Anti- Hypertensives  Monitor BPs     4.     Lower extremity edema   Venous Duplex US in AM to rule out DVTs   Full dose Lovenox ordered     5.    Renal failure, acute on chronic stage 3   Monitor BUN/Cr     6.    Diastolic CHF   Strict I/Os   Lasix PRN if BPs stable     7.     Chronic pain   PRN IV Fentanyl while Hypotensive     8.    Morbid obesity   Needs Weight Loss     9.    DVT Prophylaxis   On Full Dose Lovenox          Code Status:     FULL CODE        Family Communication:  No Family Present    Disposition Plan:    Inpatient Status        Time spent: [facility] Pager [phone]   If [place] Please Contact the Day Rounding Team MD for [facility]  If 7PM-7AM, Please Contact Night-Floor Coverage  [email or web address] Password [email or web address] 11/16/2014, 1:02 AM     ADDENDUM:   Patient was seen and examined on 11/16/2014

## 2014-11-16 NOTE — ED Notes (Signed)
I attempted to

## 2014-11-16 NOTE — Progress Notes (Signed)
[facility]  Mrs Earp was admitted this AM for an Asthma exacerbation. Started on Bipap and transitioned now to 6 L [place]  PMH: Chronic Respiratory Failure on NCO2 at 3 liters continuously, Diastolic CHF, HTN, Darier's Disease, and Hyperlipidemia, morbid obesity, OSA/ OHS  Subjective: has cough with green sputum, dyspnea improved but not back to baseline. Legs have been swollen despite taking Lasix.  Principal Problem:   Asthma exacerbation/  Acute on chronic respiratory failure with hypoxia - cont O2 to keep pulse ox > 90% - start Levaquin for acute bronchitis- cough with green mucous- allergic to may other preferred meds - cont Prednisone, nebs, wean O2 - add Mucinex  Active Problems: OHS/ OSA - lost her CPAP- needs another sleep study and CPAP - have asked case management to assist     Morbid obesity - states she has lost 25 lbs in 3 wks    Hypotension - Lisinopril on hold- follow  Chronic stage 3    Diastolic CHF - compensated  - BNP on ly 23.2 - f/u ECHO - CT chest does not reveal pulm edema - suspect pedal edema is from venous stasis - takes Lasix at home which is currently on hold - elevate legs-will consider ACE wraps on discharge    Lower extremity edema - see above  Darier's disease- autosomal dominant skin disorder - rash noted on body which appears may be consistent with this- not on appropriate treatment- retinoids are what are recommended with antibiotics or topical retinoids for flares- will need referral to dermatology  Homeless - currently living in shelter and has a case manager who is assisting her to obtaining housing again - have alerted case management  Debbe Odea, MD Pager: [email or web address]

## 2014-11-16 NOTE — ED Notes (Signed)
Pt's IV not flowing properly. Consult to IV team has been made. Pt's oxygen continues to drop to the 60's. She has been placed on a non rebreather at 10L and EDP has been notified.

## 2014-11-16 NOTE — Progress Notes (Signed)
Patient was placed on [place] at 3 L/min per home O2 setting. Patient is not in distress and current O2 saturation is 94%. RT will continue to monitor patient.

## 2014-11-16 NOTE — Progress Notes (Signed)
VASCULAR LAB PRELIMINARY  PRELIMINARY  PRELIMINARY  PRELIMINARY  Bilateral lower extremity venous duplex completed.    Preliminary report:  Technically limited due to body habitus. No obvious evidence of DVT or superficial thrombosis of the veins visualized. No evidence of Baker's cyst.  Garret Teale, RVS 11/16/2014, 12:01 PM

## 2014-11-16 NOTE — ED Notes (Signed)
RT at bedside setting up bipap

## 2014-11-16 NOTE — Care Management Note (Signed)
Case Management Note  Patient Details  Name: Kara Vincent MRN: [id number] Date of Birth: 1966-02-08  Subjective/Objective:                 asthma   Action/Plan: Date:  November 16, 2014 U.R. performed for needs and level of care. Will continue to follow for Case Management needs.  Velva Harman, RN, BSN, [place]   [phone]  Expected Discharge Date:                  Expected Discharge Plan:  Home/Self Care  In-House Referral:  NA  Discharge planning Services  CM Consult  Post Acute Care Choice:  NA Choice offered to:  NA  DME Arranged:  N/A DME Agency:  NA  HH Arranged:  NA HH Agency:  NA  Status of Service:  In process, will continue to follow  Medicare Important Message Given:    Date Medicare IM Given:    Medicare IM give by:    Date Additional Medicare IM Given:    Additional Medicare Important Message give by:     If discussed at [place] of Stay Meetings, dates discussed:    Additional Comments:  Leeroy Cha, RN 11/16/2014, 10:27 AM

## 2014-11-16 NOTE — Progress Notes (Signed)
Pt transported to ICU from ED on BIPAP without complication.  Pt tolerated well, RT to monitor and assess as needed.

## 2014-11-16 NOTE — ED Notes (Signed)
I attempted to collect labs on patient twice and was unsuccessful.  I made nurse aware.

## 2014-11-17 ENCOUNTER — Inpatient Hospital Stay (HOSPITAL_COMMUNITY): Payer: Medicaid Other

## 2014-11-17 ENCOUNTER — Encounter (HOSPITAL_COMMUNITY): Payer: Self-pay | Admitting: *Deleted

## 2014-11-17 DIAGNOSIS — E662 Morbid (severe) obesity with alveolar hypoventilation: Secondary | ICD-10-CM

## 2014-11-17 DIAGNOSIS — N183 Chronic kidney disease, stage 3 unspecified: Secondary | ICD-10-CM

## 2014-11-17 DIAGNOSIS — I509 Heart failure, unspecified: Secondary | ICD-10-CM

## 2014-11-17 DIAGNOSIS — R600 Localized edema: Secondary | ICD-10-CM

## 2014-11-17 LAB — BASIC METABOLIC PANEL
ANION GAP: 6 (ref 5–15)
ANION GAP: 5 (ref 5–15)
BUN: 19 mg/dL (ref 6–20)
BUN: 18 mg/dL (ref 6–20)
CALCIUM: 8.2 mg/dL — AB (ref 8.9–10.3)
CHLORIDE: 101 mmol/L (ref 101–111)
CO2: 32 mmol/L (ref 22–32)
CO2: 34 mmol/L — AB (ref 22–32)
Calcium: 8.2 mg/dL — ABNORMAL LOW (ref 8.9–10.3)
Chloride: 102 mmol/L (ref 101–111)
Creatinine, Ser: 1.21 mg/dL — ABNORMAL HIGH (ref 0.44–1.00)
Creatinine, Ser: 1.25 mg/dL — ABNORMAL HIGH (ref 0.44–1.00)
GFR calc non Af Amer: 49 mL/min — ABNORMAL LOW (ref >60)
GFR, EST AFRICAN AMERICAN: 59 mL/min — AB (ref >60)
GFR, EST AFRICAN AMERICAN: 57 mL/min — AB (ref >60)
GFR, EST NON AFRICAN AMERICAN: 51 mL/min — AB (ref >60)
GLUCOSE: 147 mg/dL — AB (ref 65–99)
Glucose, Bld: 130 mg/dL — ABNORMAL HIGH (ref 65–99)
POTASSIUM: 6.3 mmol/L — AB (ref 3.5–5.1)
POTASSIUM: 5.7 mmol/L — AB (ref 3.5–5.1)
SODIUM: 140 mmol/L (ref 135–145)
SODIUM: 140 mmol/L (ref 135–145)

## 2014-11-17 LAB — BLOOD GAS, ARTERIAL
ACID-BASE EXCESS: 4.8 mmol/L — AB (ref 0.0–2.0)
Bicarbonate: 33.8 mEq/L — ABNORMAL HIGH (ref 20.0–24.0)
Drawn by: 441261
O2 CONTENT: 6 L/min
O2 SAT: 79.4 %
PATIENT TEMPERATURE: 98.6
PCO2 ART: 81.2 mmHg — AB (ref 35.0–45.0)
TCO2: 32.3 mmol/L (ref 0–100)
pH, Arterial: 7.243 — ABNORMAL LOW (ref 7.350–7.450)
pO2, Arterial: 47 mmHg — ABNORMAL LOW (ref 80.0–100.0)

## 2014-11-17 LAB — CBC
HEMATOCRIT: 40.7 % (ref 36.0–46.0)
HEMOGLOBIN: 11.4 g/dL — AB (ref 12.0–15.0)
MCH: 28.3 pg (ref 26.0–34.0)
MCHC: 28 g/dL — ABNORMAL LOW (ref 30.0–36.0)
MCV: 101 fL — ABNORMAL HIGH (ref 78.0–100.0)
Platelets: 323 10*3/uL (ref 150–400)
RBC: 4.03 MIL/uL (ref 3.87–5.11)
RDW: 15.5 % (ref 11.5–15.5)
WBC: 15.8 10*3/uL — AB (ref 4.0–10.5)

## 2014-11-17 LAB — POCT I-STAT TROPONIN I: Troponin i, poc: 0 ng/mL (ref 0.00–0.08)

## 2014-11-17 LAB — POCT PREGNANCY, URINE: PREG TEST UR: NEGATIVE

## 2014-11-17 MED ORDER — ROCURONIUM BROMIDE 50 MG/5ML IV SOLN
INTRAVENOUS | Status: AC
  Filled 2014-11-17: qty 2

## 2014-11-17 MED ORDER — PERFLUTREN LIPID MICROSPHERE
1.0000 mL | INTRAVENOUS | Status: AC | PRN
  Filled 2014-11-17: qty 10

## 2014-11-17 MED ORDER — GABAPENTIN 100 MG PO CAPS
100.0000 mg | ORAL_CAPSULE | Freq: Three times a day (TID) | ORAL | Status: DC
  Administered 2014-11-17 – 2014-11-20 (×5): 100 mg via ORAL
  Filled 2014-11-17 (×9): qty 1

## 2014-11-17 MED ORDER — SUCCINYLCHOLINE CHLORIDE 20 MG/ML IJ SOLN
INTRAMUSCULAR | Status: AC
  Filled 2014-11-17: qty 1

## 2014-11-17 MED ORDER — ALBUTEROL SULFATE (2.5 MG/3ML) 0.083% IN NEBU: 2.5000 mg | INHALATION_SOLUTION | RESPIRATORY_TRACT | Status: DC | PRN

## 2014-11-17 MED ORDER — ETOMIDATE 2 MG/ML IV SOLN
INTRAVENOUS | Status: AC
  Filled 2014-11-17: qty 20

## 2014-11-17 MED ORDER — ENOXAPARIN SODIUM 100 MG/ML ~~LOC~~ SOLN
100.0000 mg | SUBCUTANEOUS | Status: DC
  Administered 2014-11-17 – 2014-11-20 (×4): 100 mg via SUBCUTANEOUS
  Filled 2014-11-17 (×5): qty 1

## 2014-11-17 MED ORDER — ALPRAZOLAM 0.5 MG PO TABS: 0.5000 mg | ORAL_TABLET | Freq: Every evening | ORAL | Status: DC | PRN

## 2014-11-17 MED ORDER — LIDOCAINE HCL (CARDIAC) 20 MG/ML IV SOLN
INTRAVENOUS | Status: AC
  Filled 2014-11-17: qty 5

## 2014-11-17 MED ORDER — FENTANYL CITRATE (PF) 100 MCG/2ML IJ SOLN
INTRAMUSCULAR | Status: AC
  Filled 2014-11-17: qty 2

## 2014-11-17 MED ORDER — HYDRALAZINE HCL 20 MG/ML IJ SOLN: 10.0000 mg | Freq: Four times a day (QID) | INTRAMUSCULAR | Status: DC | PRN

## 2014-11-17 MED ORDER — MIDAZOLAM HCL 2 MG/2ML IJ SOLN
INTRAMUSCULAR | Status: AC
  Filled 2014-11-17: qty 2

## 2014-11-17 MED ORDER — SODIUM POLYSTYRENE SULFONATE 15 GM/60ML PO SUSP
30.0000 g | Freq: Once | ORAL | Status: AC
  Administered 2014-11-17: 30 g via ORAL
  Filled 2014-11-17: qty 120

## 2014-11-17 MED ORDER — PERFLUTREN LIPID MICROSPHERE
INTRAVENOUS | Status: AC
  Filled 2014-11-17: qty 10

## 2014-11-17 MED ORDER — GUAIFENESIN-DM 100-10 MG/5ML PO SYRP
10.0000 mL | ORAL_SOLUTION | Freq: Four times a day (QID) | ORAL | Status: DC | PRN
  Administered 2014-11-19 – 2014-11-20 (×4): 10 mL via ORAL
  Filled 2014-11-17 (×4): qty 10
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
## 2014-11-17 NOTE — Progress Notes (Signed)
[facility] Progress Note Patient Name: Kara Vincent DOB: 1965/06/27 MRN: [id number]   Date of Service  11/17/2014  HPI/Events of Note  Multiple issues: 1. Headache and 2. Cough. Patient admitted with hypercapnic respiratory failure. Patient has refused intubation and BiPAP. Therefore, can't give narcotics. Patient is allergic to NSAID, therefore, can't give Motrin. Left with Tylenol which is already ordered.   eICU Interventions  Will order: 1. Robitussin DM - 10 mL PO Q 6 hours PRN.      Intervention Category Intermediate Interventions: Pain - evaluation and management  Layth Cerezo Eugene 11/17/2014, 6:13 PM

## 2014-11-17 NOTE — Progress Notes (Signed)
RT came to give morning treatment and patient was on NRB masks. O2 saturations were low and patient was placed on NRB per RN. RT administered Duoneb neb and attempted to place patient on CPAP. Patient stated she was not comfortable and did not want to wear CPAP. RT discussed the importance of wearing CPAP and patient agreed to try. CPAP was placed on patient but she said she was sick to her stomach and did not want to wear it. Venturi mask was attempted on 55% and 14 L/min and patient O2 saturations were 92-93%.  Patient fell asleep and responds when talked to, but desaturated again to 78% and was placed back on NRB.  RN notified; RT will continue to monitor patient.

## 2014-11-17 NOTE — Progress Notes (Signed)
Pt initially refusing bipap to RT and RN while sleeping, however agreed to "try it out". Patient wore bipap for 10 minutes and then RN came back to find patient had taken bipap off because it was "uncomfortable and not put on properly". Pt also complained about nausea and was given PRN medication for that. Sats low 80s on 5 L [place]. RN had to explain multiple times about low oxygen sats and patient finally allowed for a venturi mask to be put on. Sats 91% on 55% venturi mask with pt occasionally pulling mask off, coughing and spitting thick, tan sputum out.

## 2014-11-17 NOTE — Progress Notes (Addendum)
[facility] Progress Note   Kara Vincent [id number] DOB: 12-04-1965 DOA: 11/15/2014 PCP: Harvie Junior, MD  Brief narrative: Kara Vincent is a 49 y.o. female with morbid obesity, chronic respiratory failure on 3 L of oxygen at home (likely due to OHS), asthma, diastolic heart failure, hypertension,Darier's Disease and obstructive sleep apnea   Subjective: Vomited last night and again this morning. No complaints of shortness of breath. Has ongoing cough. Complains of some pain in her lower abdomen.  Assessment/Plan: Principal Problem:  Asthma exacerbation/ Acute on chronic respiratory failure with hypoxia - CT chest did not reveal acute findings - started Levaquin for acute bronchitis on 8/25 cough due to with green mucous- allergic to may other preferred meds - cont Prednisone, nebs - added Mucinex -ABG this morning shows CO2 retention, acidosis and hypoxia. Unable to use BiPAP as she was vomiting-I have asked PCCM to assist with management  Active Problems: OHS/ OSA - lost her CPAP- needs another sleep study and CPAP - have asked case management to assist  Hyperkalemia -Potassium elevated today for unknown reasons-have rechecked this-she is also constipated therefore, we'll give a dose of Kayexalate  Vomiting - When necessary Zofran- abdominal exam benign   Hypotension - Lisinopril on hold - BP now rising-cannot resume lisinopril at this point due to hyperkalemia - We'll order when necessary hydralazine  Chronic kidney disease stage 3  -Stable   Diastolic CHF?? Inaccurate diagnosis - BNP only 23.2 - ECHO reveals normal systolic and diastolic function - CT chest does not reveal pulm edema - suspect pedal edema is from venous stasis - takes Lasix at home which is currently on hold -Edema has resolved while in the hospital likely because legs have not been in a dependent position-will need Ace wraps prior to discharge   Lower extremity edema -  see above- this is most likely venous stasis  Darier's disease- autosomal dominant skin disorder - rash noted on body which appears may be consistent with this- not on appropriate treatment- retinoids are what are recommended with antibiotics or topical retinoids for flares- will need referral to dermatology  Homeless - currently living in shelter and has a case manager who is assisting her to obtaining housing again - have alerted case management   Morbid obesity - states she has been trying to lose weight and has lost 25 lbs in 3 wks- refuses a low-fat diet   Appt with PCP: Requested for next week Code Status:     Code Status Orders        Start     Ordered   11/16/14 0348  Full code   Continuous     11/16/14 0347     Family Communication:  Disposition Plan: ---continue to follow in step down unit DVT prophylaxis: Lovenox Consultants: Pulmonary critical care Procedures:  Antibiotics: Anti-infectives    Start     Dose/Rate Route Frequency Ordered Stop   11/16/14 1200  levofloxacin (LEVAQUIN) tablet 500 mg     500 mg Oral Daily 11/16/14 1041        Objective: Filed Weights   11/15/14 2252 11/16/14 0345  Weight: 187.789 kg (414 lb) 198.675 kg (438 lb)    Intake/Output Summary (Last 24 hours) at 11/17/14 1146 Last data filed at 11/17/14 0629  Gross per 24 hour  Intake      0 ml  Output   1590 ml  Net  -1590 ml     Vitals Filed Vitals:   11/17/14 0320 11/17/14 0400  11/17/14 0800 11/17/14 0853  BP:  137/72  155/61  Pulse:  98  92  Temp: 98.2 F (36.8 C)  98.8 F (37.1 C)   TempSrc: Oral  Oral   Resp:  18  22  Height:      Weight:      SpO2:    85%    Exam:  General:  Pt is alert, not in acute distress  HEENT: No icterus, No thrush, oral mucosa moist  Cardiovascular: regular rate and rhythm, S1/S2 No murmur  Respiratory: Bilateral rhonchi, no respiratory distress  Abdomen: Soft, +Bowel sounds, non tender, non distended, no guarding  MSK: No  LE edema, cyanosis or clubbing  Data Reviewed: Basic Metabolic Panel:  Recent Labs Lab 11/15/14 1632 11/15/14 2023 11/16/14 0425 11/17/14 0416 11/17/14 0620  NA 139 142 139 140 140  K 3.9 4.2 4.7 6.3* 5.7*  CL 101 105 105 102 101  CO2 29 31 29  32 34*  GLUCOSE 96 99 163* 147* 130*  BUN 25* 24* 20 19 18   CREATININE 1.44* 1.40* 1.26* 1.21* 1.25*  CALCIUM 8.2* 8.2* 7.9* 8.2* 8.2*   Liver Function Tests:  Recent Labs Lab 11/15/14 1632 11/15/14 2023  AST 36 33  ALT 15 16  ALKPHOS 53 49  BILITOT 0.5 0.4  PROT 7.0 6.5  ALBUMIN 3.5 3.2*   No results for input(s): LIPASE, AMYLASE in the last 168 hours. No results for input(s): AMMONIA in the last 168 hours. CBC:  Recent Labs Lab 11/15/14 1632 11/15/14 2023 11/16/14 0425 11/17/14 0620  WBC 8.1 9.8 7.4 15.8*  NEUTROABS 5.7  --   --   --   HGB 11.3* 10.5* 11.0* 11.4*  HCT 38.6 36.5 37.8 40.7  MCV 96.7 96.1 97.7 101.0*  PLT 286 251 236 323   Cardiac Enzymes:  Recent Labs Lab 11/16/14 0425 11/16/14 1032 11/16/14 1455  TROPONINI <0.03 <0.03 <0.03   BNP (last 3 results)  Recent Labs  07/08/14 0132 09/22/14 1628 11/15/14 1738  BNP 108.9* 106.9* 23.2    ProBNP (last 3 results)  Recent Labs  01/13/14 2108  PROBNP 541.0*    CBG: No results for input(s): GLUCAP in the last 168 hours.  Recent Results (from the past 240 hour(s))  MRSA PCR Screening     Status: Abnormal   Collection Time: 11/16/14  3:45 AM  Result Value Ref Range Status   MRSA by PCR POSITIVE (A) NEGATIVE Final    Comment:        The GeneXpert MRSA Assay (FDA approved for NASAL specimens only), is one component of a comprehensive MRSA colonization surveillance program. It is not intended to diagnose MRSA infection nor to guide or monitor treatment for MRSA infections. RESULT CALLED TO, READ BACK BY AND VERIFIED WITH: S. ROBINSON RN AT 08.25.16 BY SHUEA      Studies: Dg Chest 2 View  11/15/2014   CLINICAL DATA:  Right-sided  chest pain and shortness of breath. Lower extremity swelling.  EXAM: CHEST  2 VIEW  COMPARISON:  09/22/2014  FINDINGS: Low lung volumes are again noted. Cardiomegaly and pulmonary vascular congestion appears stable. No evidence of pulmonary consolidation or overt pulmonary edema. No evidence of pleural effusion.  IMPRESSION: Stable low lung volumes, with cardiomegaly and pulmonary vascular congestion. No acute findings.   Electronically Signed   By: Earle Gell M.D.   On: 11/15/2014 18:10   Ct Angio Chest Pe W/cm &/or Wo Cm  11/15/2014   CLINICAL DATA:  Leg swelling  and shortness of breath. Elevated D-dimer.  EXAM: CT ANGIOGRAPHY CHEST WITH CONTRAST  TECHNIQUE: Multidetector CT imaging of the chest was performed using the standard protocol during bolus administration of intravenous contrast. Multiplanar CT image reconstructions and MIPs were obtained to evaluate the vascular anatomy.  CONTRAST:  115mL OMNIPAQUE IOHEXOL 350 MG/ML SOLN  COMPARISON:  Radiographs earlier this day.  Chest CT 03/14/2012  FINDINGS: No central pulmonary embolus involving the main or lobar pulmonary arteries, technically fair/poor study. The segmental and subsegmental branches cannot be assessed due to contrast bolus timing and soft tissue attenuation from body habitus.  There is mild multi chamber cardiomegaly. No discrete right heart strain or contrast refluxing into the IVC. Thoracic aorta is normal in caliber with mild atherosclerosis. Scattered minimal coronary artery calcifications. There is no pleural or pericardial effusion. Prominent upper paratracheal lymph node measuring 10 mm short axis dimension, unchanged. There is a prominent prevascular lymph node measuring 9 mm short axis dimension, likely reactive. No definite hilar adenopathy.  Limited assessment of lung parenchyma, no consolidation to suggest pneumonia. No dominant pulmonary mass. Mild dependent and lingular atelectasis.  No acute abnormality in the included upper  abdomen. Probable hepatomegaly.  There are no acute or suspicious osseous abnormalities. There is degenerative change in the spine.  Review of the MIP images confirms the above findings.  IMPRESSION: 1. No central pulmonary embolus in the main or lobar pulmonary arteries. The segmental and subsegmental branches cannot be assessed. 2. No acute findings.  Cardiomegaly and scattered atelectasis.   Electronically Signed   By: Jeb Levering M.D.   On: 11/15/2014 23:42    Scheduled Meds:  Scheduled Meds: . allopurinol  100 mg Oral q morning - 10a  . aspirin  325 mg Oral q morning - 10a  . atorvastatin  20 mg Oral q morning - 10a  . darifenacin  7.5 mg Oral Daily  . enoxaparin (LOVENOX) injection  100 mg Subcutaneous Q24H  . gabapentin  300 mg Oral TID  . guaiFENesin  600 mg Oral BID  . hydrocerin   Topical TID  . ipratropium-albuterol  3 mL Nebulization QID  . levofloxacin  500 mg Oral Daily  . predniSONE  40 mg Oral Q breakfast  . sodium chloride  3 mL Intravenous Q12H  . sodium polystyrene  30 g Oral Once   Continuous Infusions:   Time spent on care of this patient: 35 min   Flippin, MD 11/17/2014, 11:46 AM  LOS: 1 day   [facility] Office  [phone] Pager - Text Page per [email or web address] If 7PM-7AM, please contact night-coverage [email or web address]

## 2014-11-17 NOTE — Progress Notes (Signed)
  Echocardiogram 2D Echocardiogram with Definity has been performed.  Kara Vincent 11/17/2014, 2:30 PM

## 2014-11-18 DIAGNOSIS — J209 Acute bronchitis, unspecified: Secondary | ICD-10-CM

## 2014-11-18 DIAGNOSIS — N183 Chronic kidney disease, stage 3 (moderate): Secondary | ICD-10-CM

## 2014-11-18 DIAGNOSIS — I5030 Unspecified diastolic (congestive) heart failure: Secondary | ICD-10-CM

## 2014-11-18 DIAGNOSIS — R0600 Dyspnea, unspecified: Secondary | ICD-10-CM

## 2014-11-18 DIAGNOSIS — G8929 Other chronic pain: Secondary | ICD-10-CM

## 2014-11-18 LAB — BASIC METABOLIC PANEL
Anion gap: 3 — ABNORMAL LOW (ref 5–15)
BUN: 18 mg/dL (ref 6–20)
CHLORIDE: 97 mmol/L — AB (ref 101–111)
CO2: 42 mmol/L — AB (ref 22–32)
CREATININE: 1.07 mg/dL — AB (ref 0.44–1.00)
Calcium: 7.9 mg/dL — ABNORMAL LOW (ref 8.9–10.3)
GFR calc Af Amer: >60 mL/min (ref >60)
GFR calc non Af Amer: 59 mL/min — ABNORMAL LOW (ref >60)
GLUCOSE: 119 mg/dL — AB (ref 65–99)
Potassium: 4.8 mmol/L (ref 3.5–5.1)
SODIUM: 142 mmol/L (ref 135–145)

## 2014-11-18 LAB — CBC
HEMATOCRIT: 37.9 % (ref 36.0–46.0)
Hemoglobin: 10.5 g/dL — ABNORMAL LOW (ref 12.0–15.0)
MCH: 27.9 pg (ref 26.0–34.0)
MCHC: 27.7 g/dL — AB (ref 30.0–36.0)
MCV: 100.5 fL — AB (ref 78.0–100.0)
PLATELETS: 217 10*3/uL (ref 150–400)
RBC: 3.77 MIL/uL — ABNORMAL LOW (ref 3.87–5.11)
RDW: 15.2 % (ref 11.5–15.5)
WBC: 11.6 10*3/uL — AB (ref 4.0–10.5)

## 2014-11-18 LAB — PROCALCITONIN: Procalcitonin: <0.1 ng/mL

## 2014-11-18 MED ORDER — FUROSEMIDE 10 MG/ML IJ SOLN
40.0000 mg | Freq: Two times a day (BID) | INTRAMUSCULAR | Status: DC
  Administered 2014-11-18 (×2): 40 mg via INTRAVENOUS
  Filled 2014-11-18 (×2): qty 4

## 2014-11-18 NOTE — Progress Notes (Signed)
[facility] Progress Note   Kara Vincent [id number] DOB: 04-29-1965 DOA: 11/15/2014 PCP: Harvie Junior, MD  Brief narrative: Kara Vincent is a 48 y.o. female with morbid obesity, chronic respiratory failure on 3 L of oxygen at home (likely due to OHS), asthma, diastolic heart failure, hypertension,Darier's Disease and obstructive sleep apnea   Subjective: Nauseated but has not had further vomiting.  Assessment/Plan: Principal Problem:  Asthma exacerbation/ Acute on chronic respiratory failure with hypoxia - - started Levaquin for acute bronchitis on 8/25 cough due to with green mucous- allergic to may other preferred meds - cont Prednisone, nebs - added Mucinex - CT chest did not reveal acute findings per radiology report- pulmonary feels that it did show pulmonary edema and has started the patient on Lasix today -ABG 8/26 showed CO2 retention, acidosis and hypoxia. Unable to use BiPAP as she was vomiting and patient was refusing- asked PCCM to assist with management - she stated that she did not want to be intubated and eventually agreed to using a BiPAP -Has Been weaned to a Ventimask today-keep pulse ox at 88-90% as she has CO2 retention issues  Active Problems: OHS/ OSA - lost her CPAP- needs another sleep study and CPAP - have asked case management to assist  Hyperkalemia -Potassium elevated 8/26 for unknown reasons-resolved after Kayexalate  Vomiting - When necessary Zofran- abdominal exam benign   Hypotension - Lisinopril on hold as she was initially hypotensive and subsequently hyperkalemic-we'll continue to hold as she is now been started on Lasix and creatinine will likely rise - Continue when necessary hydralazine  Chronic kidney disease stage 3  -Stable   Diastolic CHF?? Suspect Inaccurate diagnosis - BNP only 23.2 - ECHO reveals normal systolic and diastolic function but has pulm HTN and right heart failure - CT chest does not reveal  pulm edema  Pulmonary hypertension and right heart failure -Likely from morbid obesity and lack of C Pap use in setting of sleep apnea - suspect pedal edema is from this and venous stasis - takes Lasix at home which as been on hold until today -Edema has resolved while in the hospital likely because legs have not been in a dependent position -will need Ace wraps prior to discharge   Lower extremity edema - see above- this is most likely venous stasis and right heart failure  Darier's disease- autosomal dominant skin disorder - rash noted on body which appears may be consistent with this- not on appropriate treatment- retinoids are what are recommended with antibiotics or topical retinoids for flares- will need referral to dermatology to have requested  Homeless - currently living in shelter and has a case manager who is assisting her to obtaining housing again - have alerted case management   Morbid obesity - states she has been trying to lose weight and has lost 25 lbs in 3 wks- refuses a low-fat diet   Appt with PCP: Requested for next week Code Status:     Code Status Orders        Start     Ordered   11/16/14 0348  Full code   Continuous     11/16/14 0347     Family Communication:  Disposition Plan: ---continue to follow in step down unit DVT prophylaxis: Lovenox Consultants: Pulmonary critical care Procedures:  Antibiotics: Anti-infectives    Start     Dose/Rate Route Frequency Ordered Stop   11/16/14 1200  levofloxacin (LEVAQUIN) tablet 500 mg     500 mg Oral  Daily 11/16/14 1041        Objective: Filed Weights   11/15/14 2252 11/16/14 0345  Weight: 187.789 kg (414 lb) 198.675 kg (438 lb)    Intake/Output Summary (Last 24 hours) at 11/18/14 0928 Last data filed at 11/18/14 0600  Gross per 24 hour  Intake    260 ml  Output   1625 ml  Net  -1365 ml     Vitals Filed Vitals:   11/18/14 0115 11/18/14 0225 11/18/14 0400 11/18/14 0749  BP: 130/71   134/97   Pulse: 91  99   Temp: 99.3 F (37.4 C)  99.7 F (37.6 C) 98.8 F (37.1 C)  TempSrc: Oral  Axillary Axillary  Resp: 27  26   Height:      Weight:      SpO2: 92% 96% 96%     Exam:  General:  Pt is alert, not in acute distress  HEENT: No icterus, No thrush, oral mucosa moist  Cardiovascular: regular rate and rhythm, S1/S2 No murmur  Respiratory: Bilateral rhonchi, no respiratory distress  Abdomen: Soft, +Bowel sounds, non tender, non distended, no guarding  MSK: No LE edema, cyanosis or clubbing  Data Reviewed: Basic Metabolic Panel:  Recent Labs Lab 11/15/14 2023 11/16/14 0425 11/17/14 0416 11/17/14 0620 11/18/14 0736  NA 142 139 140 140 142  K 4.2 4.7 6.3* 5.7* 4.8  CL 105 105 102 101 97*  CO2 31 29 32 34* 42*  GLUCOSE 99 163* 147* 130* 119*  BUN 24* 20 19 18 18   CREATININE 1.40* 1.26* 1.21* 1.25* 1.07*  CALCIUM 8.2* 7.9* 8.2* 8.2* 7.9*   Liver Function Tests:  Recent Labs Lab 11/15/14 1632 11/15/14 2023  AST 36 33  ALT 15 16  ALKPHOS 53 49  BILITOT 0.5 0.4  PROT 7.0 6.5  ALBUMIN 3.5 3.2*   No results for input(s): LIPASE, AMYLASE in the last 168 hours. No results for input(s): AMMONIA in the last 168 hours. CBC:  Recent Labs Lab 11/15/14 1632 11/15/14 2023 11/16/14 0425 11/17/14 0620 11/18/14 0736  WBC 8.1 9.8 7.4 15.8* 11.6*  NEUTROABS 5.7  --   --   --   --   HGB 11.3* 10.5* 11.0* 11.4* 10.5*  HCT 38.6 36.5 37.8 40.7 37.9  MCV 96.7 96.1 97.7 101.0* 100.5*  PLT 286 251 236 323 217   Cardiac Enzymes:  Recent Labs Lab 11/16/14 0425 11/16/14 1032 11/16/14 1455  TROPONINI <0.03 <0.03 <0.03   BNP (last 3 results)  Recent Labs  07/08/14 0132 09/22/14 1628 11/15/14 1738  BNP 108.9* 106.9* 23.2    ProBNP (last 3 results)  Recent Labs  01/13/14 2108  PROBNP 541.0*    CBG: No results for input(s): GLUCAP in the last 168 hours.  Recent Results (from the past 240 hour(s))  MRSA PCR Screening     Status: Abnormal    Collection Time: 11/16/14  3:45 AM  Result Value Ref Range Status   MRSA by PCR POSITIVE (A) NEGATIVE Final    Comment:        The GeneXpert MRSA Assay (FDA approved for NASAL specimens only), is one component of a comprehensive MRSA colonization surveillance program. It is not intended to diagnose MRSA infection nor to guide or monitor treatment for MRSA infections. RESULT CALLED TO, READ BACK BY AND VERIFIED WITH: S. ROBINSON RN AT 08.25.16 BY SHUEA      Studies: No results found.  Scheduled Meds:  Scheduled Meds: . allopurinol  100 mg Oral  q morning - 10a  . aspirin  325 mg Oral q morning - 10a  . atorvastatin  20 mg Oral q morning - 10a  . darifenacin  7.5 mg Oral Daily  . enoxaparin (LOVENOX) injection  100 mg Subcutaneous Q24H  . furosemide  40 mg Intravenous Q12H  . gabapentin  100 mg Oral TID  . guaiFENesin  600 mg Oral BID  . hydrocerin   Topical TID  . ipratropium-albuterol  3 mL Nebulization QID  . levofloxacin  500 mg Oral Daily  . predniSONE  40 mg Oral Q breakfast  . sodium chloride  3 mL Intravenous Q12H   Continuous Infusions:   Time spent on care of this patient: 35 min   Riva, MD 11/18/2014, 9:28 AM  LOS: 2 days   [facility] Office  [phone] Pager - Text Page per [email or web address] If 7PM-7AM, please contact night-coverage [email or web address]

## 2014-11-18 NOTE — Consult Note (Signed)
Name: Kara Vincent MRN: [id number] DOB: 14-Aug-1965    ADMISSION DATE:  11/15/2014 CONSULTATION DATE:  11/17/14  REFERRING MD :  Dr. Wynelle Cleveland / [facility]  CHIEF COMPLAINT:  OSA / Acute on Chronic Respiratory Failure  BRIEF PATIENT DESCRIPTION: 49 y/o F with PMH of morbid obesity, asthma, CHF, OSA not on CPAP and renal insufficiency admitted 8/24 with complaints of 4-5 days of increased lower extremity swelling and leg pain.  She was found to be hypoxic on admit with sats in the 70's and placed on BiPAP.  PE was ruled out with CTA.  She was admitted per [facility].  She developed nausea / vomiting the evening of 8/25 and was unable to wear CPAP.  PCCM consulted on 8/26 for hypercarbic respiratory failure and altered mental status.    SIGNIFICANT EVENTS  8/24  Admit with 4-5 days increased LE swelling, leg pain 8/26  PCCM consulted for hypercarbic respiratory failure.   STUDIES:  8/24  CTA Chest >> neg for PE, no acute findings, cardiomegaly 8/25 lower ext dopplers>>>neg 8/26 echo- 65%, Pa 45, poor images  SUBJECTIVE: Bipap , neg 1.3 liters  VITAL SIGNS: Temp:  [98.1 F (36.7 C)-99.7 F (37.6 C)] 98.8 F (37.1 C) (08/27 0749) Pulse Rate:  [31-99] 99 (08/27 0400) Resp:  [20-27] 26 (08/27 0400) BP: (105-134)/(44-97) 134/97 mmHg (08/27 0400) SpO2:  [86 %-97 %] 96 % (08/27 0400) FiO2 (%):  [40 %-60 %] 60 % (08/27 0400)  PHYSICAL EXAMINATION: General:  Morbidly obese female, more awake, talking Neuro:  Alert, o x 3 HEENT:  MM pink /moist, jvd difficult to assess Cardiovascular:  s1s2 rrr, no m Lungs:  Distant, reduced wheezing Abdomen:  Obese/soft, bsx4 active  Musculoskeletal:  No acute deformities  Skin:  Warm/dry, difficult to assess edema / no overt pitting edema, chronic scattered small lesions on arms/legs   Recent Labs Lab 11/17/14 0416 11/17/14 0620 11/18/14 0736  NA 140 140 142  K 6.3* 5.7* 4.8  CL 102 101 97*  CO2 32 34* 42*  BUN 19 18 18   CREATININE 1.21* 1.25* 1.07*    GLUCOSE 147* 130* 119*    Recent Labs Lab 11/16/14 0425 11/17/14 0620 11/18/14 0736  HGB 11.0* 11.4* 10.5*  HCT 37.8 40.7 37.9  WBC 7.4 15.8* 11.6*  PLT 236 323 217   No results found.  ASSESSMENT / PLAN:   Acute on Chronic Hypercarbic / Hypoxic Respiratory Failure  OSA Acute Bronchitis   Plan: BiPAP can be reduced given improved WOB and resp status after neg balance Would move to 4 hrs on 6 hr off x 24 hr, likely can dc daytime bipap in am  Patient does not want intubation.  She is oriented and understands risk of death without intubation- per Dr [place] Bells Avoid sedating narcs D2/x levaquin for acute bronchitis  Pulmonary hygiene:  IS, flutter, mobilize as able  likely can limit abx course, assess pct Continue same neg balance goals, did very well with this approach , appears on CT and pcxr as edema  Asthma   Plan: Duoneb Q6 with Q3 PRN albuterol   CKD Hyperkalemia resolved Plan: Trend BMP / UOP  Replace electrolytes in am  Lasix 40 mg maintain to bid  Hx dCHF, HTN, HLD   Plan: Goal net negative balance same as last 24 hr Assess ECHO reviewed, poor images, likely to have diastolic dz Continue lipitor, ASA  Nausea / Vomiting -improved Morbid Obesity   Plan: PRN zofran Hold diet further  Ccm  time 30 min   Lavon Paganini. Titus Mould, MD, [facility] Pgr: [facility]

## 2014-11-18 NOTE — Progress Notes (Signed)
Received call from RN, to inform me that the patient has removed herself from NIPPV and refuses to wear it. RN to place on venturi mask. RT will continue to follow and re-attempt NIV once the patient is more compliant and willing to try.

## 2014-11-19 ENCOUNTER — Inpatient Hospital Stay (HOSPITAL_COMMUNITY): Payer: Medicaid Other

## 2014-11-19 LAB — BASIC METABOLIC PANEL
Anion gap: 6 (ref 5–15)
BUN: 19 mg/dL (ref 6–20)
CALCIUM: 8.1 mg/dL — AB (ref 8.9–10.3)
CO2: 45 mmol/L — ABNORMAL HIGH (ref 22–32)
CREATININE: 1.14 mg/dL — AB (ref 0.44–1.00)
Chloride: 92 mmol/L — ABNORMAL LOW (ref 101–111)
GFR calc non Af Amer: 55 mL/min — ABNORMAL LOW (ref >60)
Glucose, Bld: 99 mg/dL (ref 65–99)
Potassium: 4 mmol/L (ref 3.5–5.1)
SODIUM: 143 mmol/L (ref 135–145)

## 2014-11-19 LAB — CBC
HEMATOCRIT: 36.1 % (ref 36.0–46.0)
Hemoglobin: 10.2 g/dL — ABNORMAL LOW (ref 12.0–15.0)
MCH: 27.9 pg (ref 26.0–34.0)
MCHC: 28.3 g/dL — AB (ref 30.0–36.0)
MCV: 98.6 fL (ref 78.0–100.0)
Platelets: 218 10*3/uL (ref 150–400)
RBC: 3.66 MIL/uL — ABNORMAL LOW (ref 3.87–5.11)
RDW: 14.5 % (ref 11.5–15.5)
WBC: 9.6 10*3/uL (ref 4.0–10.5)

## 2014-11-19 LAB — MAGNESIUM: MAGNESIUM: 2.1 mg/dL (ref 1.7–2.4)

## 2014-11-19 LAB — PHOSPHORUS: PHOSPHORUS: 3.4 mg/dL (ref 2.5–4.6)

## 2014-11-19 LAB — PROCALCITONIN: Procalcitonin: <0.1 ng/mL

## 2014-11-19 MED ORDER — LEVOFLOXACIN 500 MG PO TABS
500.0000 mg | ORAL_TABLET | Freq: Every day | ORAL | Status: AC
  Administered 2014-11-19 – 2014-11-20 (×2): 500 mg via ORAL
  Filled 2014-11-19 (×2): qty 1

## 2014-11-19 MED ORDER — FUROSEMIDE 10 MG/ML IJ SOLN
40.0000 mg | Freq: Once | INTRAMUSCULAR | Status: AC
  Administered 2014-11-19: 40 mg via INTRAVENOUS
  Filled 2014-11-19: qty 4

## 2014-11-19 NOTE — Progress Notes (Signed)
Patient reluctantly agreed to try BiPAP again. Applied to face via full face mask. Tolerating well at this time.

## 2014-11-19 NOTE — Consult Note (Addendum)
   Name: Kara Vincent MRN: [id number] DOB: 1965-04-07    ADMISSION DATE:  11/15/2014 CONSULTATION DATE:  11/17/14  REFERRING MD :  Dr. Wynelle Cleveland / [facility]  CHIEF COMPLAINT:  OSA / Acute on Chronic Respiratory Failure  BRIEF PATIENT DESCRIPTION: 49 y/o F with PMH of morbid obesity, asthma, CHF, OSA not on CPAP and renal insufficiency admitted 8/24 with complaints of 4-5 days of increased lower extremity swelling and leg pain.  She was found to be hypoxic on admit with sats in the 70's and placed on BiPAP.  PE was ruled out with CTA.  She was admitted per [facility].  She developed nausea / vomiting the evening of 8/25 and was unable to wear CPAP.  PCCM consulted on 8/26 for hypercarbic respiratory failure and altered mental status.    SIGNIFICANT EVENTS  8/24  Admit with 4-5 days increased LE swelling, leg pain 8/26  PCCM consulted for hypercarbic respiratory failure.   STUDIES:  8/24  CTA Chest >> neg for PE, no acute findings, cardiomegaly 8/25 lower ext dopplers>>>neg 8/26 echo- 65%, Pa 45, poor images 8/27 - neg 5 liters  SUBJECTIVE: Bipap used last night, no distress this am , awake  VITAL SIGNS: Temp:  [98.2 F (36.8 C)-98.9 F (37.2 C)] 98.4 F (36.9 C) (08/28 0750) Pulse Rate:  [74-90] 76 (08/28 0500) Resp:  [19-27] 20 (08/28 0500) BP: (126-154)/(55-90) 144/62 mmHg (08/28 0400) SpO2:  [92 %-98 %] 94 % (08/28 0808) FiO2 (%):  [40 %-55 %] 40 % (08/28 0343)  PHYSICAL EXAMINATION: General:  Morbidly obese female, awake, talking well Neuro:  Alert, o x 3 HEENT:  jvd difficult to assess Cardiovascular:  s1s2 rrr, no m Lungs:  Distant Abdomen:  Obese/soft, bsx4 active, reduced  Musculoskeletal:  No acute deformities  Skin:  Some edema   Recent Labs Lab 11/17/14 0620 11/18/14 0736 11/19/14 0403  NA 140 142 143  K 5.7* 4.8 4.0  CL 101 97* 92*  CO2 34* 42* 45*  BUN 18 18 19   CREATININE 1.25* 1.07* 1.14*  GLUCOSE 130* 119* 99    Recent Labs Lab 11/17/14 0620  11/18/14 0736 11/19/14 0403  HGB 11.4* 10.5* 10.2*  HCT 40.7 37.9 36.1  WBC 15.8* 11.6* 9.6  PLT 323 217 218   No results found.  ASSESSMENT / PLAN:   Acute on Chronic Hypercarbic / Hypoxic Respiratory Failure  OSA Acute Bronchitis not noted Mild PA c/w OSA . OHS Plan: Improved with neg balance 5 liters overnight Reduce lasix back to daily pcxr improved, no repeat in am  Change BIPAP to qhs, prn daytime Mobilize as able Consider dc all abx, pct neg, neg clinical impression infection Following clinical course of edema  Asthma   Plan: Duoneb Q6 with Q3 PRN albuterol   CKD Hyperkalemia resolved Plan: bmet in am  Lasix 40 mg to once daily  Hx dCHF, HTN, HLD   Plan: Goal net negative balance 1 liter Assess ECHO reviewed, poor images, likely to have diastolic dz Continue lipitor, ASA   Lavon Paganini. Titus Mould, MD, [place] Pgr: [facility]

## 2014-11-19 NOTE — Progress Notes (Addendum)
[facility] Progress Note   Kara Vincent [id number] DOB: Aug 22, 1965 DOA: 11/15/2014 PCP: Harvie Junior, MD  Brief narrative: Kara Vincent is a 49 y.o. female with morbid obesity, chronic respiratory failure on 3 L of oxygen at home (likely due to OHS), asthma, diastolic heart failure, hypertension,Darier's Disease and obstructive sleep apnea   Subjective: Very somnolent- No complaints of nausea, cough, dyspnea this AM. Per RN, she ate solid food last night.   Assessment/Plan: Principal Problem:  Asthma exacerbation/ Acute on chronic respiratory failure with hypoxia - - started Levaquin for acute bronchitis on 8/25 cough due to with green mucous- allergic to may other preferred meds- will stop after tomorrow's dose to complete a 5 day course - cont Prednisone, nebs - added Mucinex - CT chest did not reveal acute findings per radiology report- pulmonary feels that it did show pulmonary edema and has started the patient on Lasix  - she was in negative balance by 5 L yesterday for a total of 8 L since admission -ABG 8/26 showed CO2 retention, acidosis and hypoxia. Unable to use BiPAP as she was vomiting and patient was refusing- asked PCCM to assist with management - subsequently has been intermittently agreeing to use BiPAP - she stated that she did not want to be intubated and eventually agreed to using a BiPAP - keep pulse ox at 88-90% as she has CO2 retention issues  Acute Bronchitis - Levaquin x 5 days- see above  OHS/ OSA - lost her CPAP- needs another sleep study and CPAP - have asked case management to assist  Hyperkalemia -Potassium elevated 8/26 for unknown reasons-resolved after Kayexalate  Vomiting - When necessary Zofran- abdominal exam benign- improving   Hypotension - Lisinopril on hold as she was initially hypotensive and subsequently hyperkalemic-we'll continue to hold as she is now been started on Lasix and creatinine might rise - Continue  when necessary hydralazine  Chronic kidney disease stage 3  -Stable   Diastolic CHF?? Suspect Inaccurate diagnosis - BNP only 23.2 - ECHO reveals normal systolic and diastolic function but has pulm HTN and right heart failure - CT chest does not reveal pulm edema  Pulmonary hypertension and right heart failure -Likely from morbid obesity and lack of C Pap use in setting of sleep apnea - suspect pedal edema is from this and venous stasis - diuretics per pulmonary   Lower extremity edema - see above- this is most likely venous stasis and right heart failure -will need Ace wraps prior to discharge  Darier's disease- autosomal dominant skin disorder - rash noted on body which appears may be consistent with this- not on appropriate treatment- retinoids are what are recommended with antibiotics or topical retinoids for flares- will need referral to dermatology to have requested  Homeless - currently living in shelter and has a case manager who is assisting her to obtaining housing again - have alerted case management   Morbid obesity - states she has been trying to lose weight and has lost 25 lbs in 3 wks- refuses a low-fat diet   Appt with PCP: Requested for next week Code Status:     Code Status Orders        Start     Ordered   11/16/14 0348  Full code   Continuous     11/16/14 0347     Disposition Plan: ---continue to follow in step down unit DVT prophylaxis: Lovenox Consultants: Pulmonary critical care Procedures:  Antibiotics: Anti-infectives    Start  Dose/Rate Route Frequency Ordered Stop   11/19/14 1000  levofloxacin (LEVAQUIN) tablet 500 mg     500 mg Oral Daily 11/19/14 0943 11/21/14 0959   11/16/14 1200  levofloxacin (LEVAQUIN) tablet 500 mg  Status:  Discontinued     500 mg Oral Daily 11/16/14 1041 11/19/14 0842      Objective: Filed Weights   11/15/14 2252 11/16/14 0345  Weight: 187.789 kg (414 lb) 198.675 kg (438 lb)    Intake/Output  Summary (Last 24 hours) at 11/19/14 0948 Last data filed at 11/19/14 0600  Gross per 24 hour  Intake      0 ml  Output   5350 ml  Net  -5350 ml     Vitals Filed Vitals:   11/19/14 0400 11/19/14 0500 11/19/14 0750 11/19/14 0808  BP: 144/62     Pulse: 75 76    Temp:   98.4 F (36.9 C)   TempSrc:   Oral   Resp: 25 20    Height:      Weight:      SpO2: 92% 98%  94%    Exam:  General:  Pt is sleepy, not in acute distress  HEENT: No icterus, No thrush, oral mucosa moist  Cardiovascular: regular rate and rhythm, S1/S2 No murmur  Respiratory: CTA b/l  no respiratory distress  Abdomen: Soft, +Bowel sounds, non tender, non distended, no guarding  MSK: No LE edema, cyanosis or clubbing  Data Reviewed: Basic Metabolic Panel:  Recent Labs Lab 11/16/14 0425 11/17/14 0416 11/17/14 0620 11/18/14 0736 11/19/14 0403  NA 139 140 140 142 143  K 4.7 6.3* 5.7* 4.8 4.0  CL 105 102 101 97* 92*  CO2 29 32 34* 42* 45*  GLUCOSE 163* 147* 130* 119* 99  BUN 20 19 18 18 19   CREATININE 1.26* 1.21* 1.25* 1.07* 1.14*  CALCIUM 7.9* 8.2* 8.2* 7.9* 8.1*  MG  --   --   --   --  2.1  PHOS  --   --   --   --  3.4   Liver Function Tests:  Recent Labs Lab 11/15/14 1632 11/15/14 2023  AST 36 33  ALT 15 16  ALKPHOS 53 49  BILITOT 0.5 0.4  PROT 7.0 6.5  ALBUMIN 3.5 3.2*   No results for input(s): LIPASE, AMYLASE in the last 168 hours. No results for input(s): AMMONIA in the last 168 hours. CBC:  Recent Labs Lab 11/15/14 1632 11/15/14 2023 11/16/14 0425 11/17/14 0620 11/18/14 0736 11/19/14 0403  WBC 8.1 9.8 7.4 15.8* 11.6* 9.6  NEUTROABS 5.7  --   --   --   --   --   HGB 11.3* 10.5* 11.0* 11.4* 10.5* 10.2*  HCT 38.6 36.5 37.8 40.7 37.9 36.1  MCV 96.7 96.1 97.7 101.0* 100.5* 98.6  PLT 286 251 236 323 217 218   Cardiac Enzymes:  Recent Labs Lab 11/16/14 0425 11/16/14 1032 11/16/14 1455  TROPONINI <0.03 <0.03 <0.03   BNP (last 3 results)  Recent Labs   07/08/14 0132 09/22/14 1628 11/15/14 1738  BNP 108.9* 106.9* 23.2    ProBNP (last 3 results)  Recent Labs  01/13/14 2108  PROBNP 541.0*    CBG: No results for input(s): GLUCAP in the last 168 hours.  Recent Results (from the past 240 hour(s))  MRSA PCR Screening     Status: Abnormal   Collection Time: 11/16/14  3:45 AM  Result Value Ref Range Status   MRSA by PCR POSITIVE (A) NEGATIVE Final  Comment:        The GeneXpert MRSA Assay (FDA approved for NASAL specimens only), is one component of a comprehensive MRSA colonization surveillance program. It is not intended to diagnose MRSA infection nor to guide or monitor treatment for MRSA infections. RESULT CALLED TO, READ BACK BY AND VERIFIED WITH: Ruffin Frederick RN AT 08.25.16 BY Dreama Saa      Studies: Dg Chest Port 1 View  11/19/2014   CLINICAL DATA:  Followup of pulmonary edema. Asthma. Hypertension. Morbid obesity.  EXAM: PORTABLE CHEST - 1 VIEW  COMPARISON:  11/15/2014  FINDINGS: degraded exam due to AP portable technique and patient body habitus. Midline trachea. Cardiomegaly accentuated by AP portable technique. Atherosclerosis in the transverse aorta. No right and no definite left pleural effusion. No pneumothorax. Interstitial edema is likely similar, given improved inspiratory effort on the current exam. Probable left base atelectasis versus superimposition of overlying soft tissues.  IMPRESSION: Cardiomegaly with persistent congestive heart failure.  Decreased sensitivity and specificity exam due to technique related factors, as described above.   Electronically Signed   By: Abigail Miyamoto M.D.   On: 11/19/2014 08:46    Scheduled Meds:  Scheduled Meds: . allopurinol  100 mg Oral q morning - 10a  . aspirin  325 mg Oral q morning - 10a  . atorvastatin  20 mg Oral q morning - 10a  . darifenacin  7.5 mg Oral Daily  . enoxaparin (LOVENOX) injection  100 mg Subcutaneous Q24H  . furosemide  40 mg Intravenous Once  .  gabapentin  100 mg Oral TID  . guaiFENesin  600 mg Oral BID  . hydrocerin   Topical TID  . ipratropium-albuterol  3 mL Nebulization QID  . levofloxacin  500 mg Oral Daily  . predniSONE  40 mg Oral Q breakfast  . sodium chloride  3 mL Intravenous Q12H   Continuous Infusions:   Time spent on care of this patient: 35 min   Dougherty, MD 11/19/2014, 9:48 AM  LOS: 3 days   [facility] Office  [phone] Pager - Text Page per [email or web address] If 7PM-7AM, please contact night-coverage [email or web address]

## 2014-11-19 NOTE — Progress Notes (Signed)
Patient demanded to be taken off Bipap. RT placed her on 6L nasal cannula and sats are 99% RT will continue to monitor.

## 2014-11-20 DIAGNOSIS — J208 Acute bronchitis due to other specified organisms: Secondary | ICD-10-CM

## 2014-11-20 DIAGNOSIS — G4733 Obstructive sleep apnea (adult) (pediatric): Secondary | ICD-10-CM

## 2014-11-20 DIAGNOSIS — J209 Acute bronchitis, unspecified: Secondary | ICD-10-CM

## 2014-11-20 DIAGNOSIS — I50810 Right heart failure, unspecified: Secondary | ICD-10-CM

## 2014-11-20 DIAGNOSIS — I509 Heart failure, unspecified: Secondary | ICD-10-CM

## 2014-11-20 LAB — BASIC METABOLIC PANEL
ANION GAP: 9 (ref 5–15)
BUN: 20 mg/dL (ref 6–20)
CHLORIDE: 89 mmol/L — AB (ref 101–111)
CO2: 43 mmol/L — ABNORMAL HIGH (ref 22–32)
Calcium: 7.9 mg/dL — ABNORMAL LOW (ref 8.9–10.3)
Creatinine, Ser: 1.12 mg/dL — ABNORMAL HIGH (ref 0.44–1.00)
GFR calc Af Amer: >60 mL/min (ref >60)
GFR, EST NON AFRICAN AMERICAN: 56 mL/min — AB (ref >60)
Glucose, Bld: 89 mg/dL (ref 65–99)
POTASSIUM: 4.6 mmol/L (ref 3.5–5.1)
SODIUM: 141 mmol/L (ref 135–145)

## 2014-11-20 LAB — PHOSPHORUS: PHOSPHORUS: 3.8 mg/dL (ref 2.5–4.6)

## 2014-11-20 LAB — PROCALCITONIN

## 2014-11-20 LAB — MAGNESIUM: MAGNESIUM: 2.2 mg/dL (ref 1.7–2.4)

## 2014-11-20 MED ORDER — LISINOPRIL 10 MG PO TABS
40.0000 mg | ORAL_TABLET | Freq: Every day | ORAL | Status: DC
  Administered 2014-11-20 – 2014-11-21 (×2): 40 mg via ORAL
  Filled 2014-11-20 (×2): qty 4

## 2014-11-20 MED ORDER — GUAIFENESIN-DM 100-10 MG/5ML PO SYRP
10.0000 mL | ORAL_SOLUTION | Freq: Four times a day (QID) | ORAL | Status: DC
  Administered 2014-11-20 – 2014-11-21 (×6): 10 mL via ORAL
  Filled 2014-11-20 (×6): qty 10

## 2014-11-20 MED ORDER — CHLORHEXIDINE GLUCONATE CLOTH 2 % EX PADS
6.0000 | MEDICATED_PAD | Freq: Every day | CUTANEOUS | Status: DC
  Administered 2014-11-20: 6 via TOPICAL

## 2014-11-20 MED ORDER — HYDROCOD POLST-CPM POLST ER 10-8 MG/5ML PO SUER: 5.0000 mL | Freq: Once | ORAL | Status: DC

## 2014-11-20 MED ORDER — HYDROCODONE-ACETAMINOPHEN 5-325 MG PO TABS
1.0000 | ORAL_TABLET | Freq: Once | ORAL | Status: AC
  Administered 2014-11-20: 1 via ORAL
  Filled 2014-11-20: qty 1

## 2014-11-20 MED ORDER — ADULT MULTIVITAMIN W/MINERALS CH
1.0000 | ORAL_TABLET | Freq: Every day | ORAL | Status: DC
  Administered 2014-11-21: 1 via ORAL
  Filled 2014-11-20: qty 1

## 2014-11-20 MED ORDER — HYDROCODONE-ACETAMINOPHEN 5-325 MG PO TABS
1.0000 | ORAL_TABLET | Freq: Four times a day (QID) | ORAL | Status: DC | PRN
  Administered 2014-11-20 – 2014-11-21 (×2): 1 via ORAL
  Filled 2014-11-20 (×2): qty 1

## 2014-11-20 MED ORDER — MUPIROCIN 2 % EX OINT
1.0000 "application " | TOPICAL_OINTMENT | Freq: Two times a day (BID) | CUTANEOUS | Status: DC
  Administered 2014-11-20 – 2014-11-21 (×4): 1 via NASAL
  Filled 2014-11-20: qty 22

## 2014-11-20 MED ORDER — PREDNISONE 20 MG PO TABS
20.0000 mg | ORAL_TABLET | Freq: Every day | ORAL | Status: DC
  Administered 2014-11-21: 20 mg via ORAL
  Filled 2014-11-20: qty 1

## 2014-11-20 NOTE — Progress Notes (Signed)
Patient does not want foley d/c now.

## 2014-11-20 NOTE — Progress Notes (Signed)
Name: Kara Vincent MRN: [id number] DOB: 1966-02-25    ADMISSION DATE:  11/15/2014 CONSULTATION DATE:  11/17/14  REFERRING MD :  Dr. Wynelle Cleveland / [facility]  CHIEF COMPLAINT:  OSA / Acute on Chronic Respiratory Failure  BRIEF PATIENT DESCRIPTION: 49 y/o F with PMH of morbid obesity, asthma, CHF, OSA not on CPAP and renal insufficiency admitted 8/24 with complaints of 4-5 days of increased lower extremity swelling and leg pain.  She was found to be hypoxic on admit with sats in the 70's and placed on BiPAP.  PE was ruled out with CTA.  She was admitted per [facility].  She developed nausea / vomiting the evening of 8/25 and was unable to wear CPAP.  PCCM consulted on 8/26 for hypercarbic respiratory failure and altered mental status.    SIGNIFICANT EVENTS  8/24  Admit with 4-5 days increased LE swelling, leg pain 8/26  PCCM consulted for hypercarbic respiratory failure.   STUDIES:  8/24  CTA Chest >> neg for PE, no acute findings, cardiomegaly 8/25 lower ext dopplers>>>neg 8/26 echo- 65%, Pa 45, poor images 8/27 - neg 5 liters  SUBJECTIVE: Neg 3L Used bipap 5-6 h overnight sitting in chair  VITAL SIGNS: Temp:  [97.7 F (36.5 C)-98.3 F (36.8 C)] 97.7 F (36.5 C) (08/29 0800) Pulse Rate:  [76-89] 84 (08/29 0800) Resp:  [15-29] 18 (08/29 0800) BP: (115-141)/(54-65) 123/62 mmHg (08/29 0403) SpO2:  [69 %-99 %] 93 % (08/29 0926) Weight:  [428 lb (194.14 kg)] 428 lb (194.14 kg) (08/29 0409)  PHYSICAL EXAMINATION: General:  Morbidly obese female, awake, interactive Neuro:  Alert, o x 3 HEENT:  jvd difficult to assess Cardiovascular:  s1s2 rrr, no m Lungs:  Distant Abdomen:  Obese/soft, bsx4 active, reduced  Musculoskeletal:  No acute deformities  Skin:  Some edema   Recent Labs Lab 11/18/14 0736 11/19/14 0403 11/20/14 0401  NA 142 143 141  K 4.8 4.0 4.6  CL 97* 92* 89*  CO2 42* 45* 43*  BUN 18 19 20   CREATININE 1.07* 1.14* 1.12*  GLUCOSE 119* 99 89    Recent Labs Lab  11/17/14 0620 11/18/14 0736 11/19/14 0403  HGB 11.4* 10.5* 10.2*  HCT 40.7 37.9 36.1  WBC 15.8* 11.6* 9.6  PLT 323 217 218   Dg Chest Port 1 View  11/19/2014   CLINICAL DATA:  Followup of pulmonary edema. Asthma. Hypertension. Morbid obesity.  EXAM: PORTABLE CHEST - 1 VIEW  COMPARISON:  11/15/2014  FINDINGS: degraded exam due to AP portable technique and patient body habitus. Midline trachea. Cardiomegaly accentuated by AP portable technique. Atherosclerosis in the transverse aorta. No right and no definite left pleural effusion. No pneumothorax. Interstitial edema is likely similar, given improved inspiratory effort on the current exam. Probable left base atelectasis versus superimposition of overlying soft tissues.  IMPRESSION: Cardiomegaly with persistent congestive heart failure.  Decreased sensitivity and specificity exam due to technique related factors, as described above.   Electronically Signed   By: Abigail Miyamoto M.D.   On: 11/19/2014 08:46    ASSESSMENT / PLAN:   Acute on Chronic Hypercarbic / Hypoxic Respiratory Failure  OSA Acute Bronchitis not noted Mild PA c/w OSA . OHS Plan: Improved with neg balance  Change BIPAP to qhs Mobilize as able Consider dc all abx, pct neg, neg clinical impression infection   Asthma   Plan: Duoneb Q6 with Q3 PRN albuterol   CKD Hyperkalemia resolved Plan: Lasix 40 mg to once daily  Hx dCHF, HTN, HLD  ECHO reviewed, poor images, likely to have diastolic dz Plan: Goal net negative balance 1 liter  Continue lipitor, ASA   Summary - OSA/OHS needs home Bipap , several ED/ hosp visits noted - clearly social issues predominate - can transfer to floor with noct bipap PCCM to sign off  Kara Mead MD. Shade Flood. [facility] Pager [phone] If no response call [phone]

## 2014-11-20 NOTE — Progress Notes (Signed)
Date:  November 20, 2014 U.R. performed for needs and level of care. Will continue to follow for Case Management needs.  Velva Harman, RN, BSN, [place]   [phone]

## 2014-11-20 NOTE — Progress Notes (Signed)
[facility] Progress Note   Kara Vincent [id number] DOB: [birth year] DOA: 11/15/2014 PCP: Harvie Junior, MD  Brief narrative: Kara Vincent is a 49 y.o. female with morbid obesity, chronic respiratory failure on 3 L of oxygen at home (likely due to OHS), asthma, diastolic heart failure, hypertension,Darier's Disease and obstructive sleep apnea who does not wear C Pap.   Subjective: Complains of pain in her right back and ongoing cough with small amount of yellow mucus. States she is short of breath. I've been able to wean her down to 2 L of oxygen which is keeping her pulse ox about 90 %. No nausea.  Assessment/Plan: Principal Problem:  Asthma exacerbation/ Acute on chronic respiratory failure with hypoxia -Started on steroids on admission along with nebulizer treatments - started Levaquin for acute bronchitis on 8/25 cough due to with green mucous- allergic to may other preferred meds- will stop after today's dose to complete a 5 day course -ABG 8/26 showed CO2 retention, acidosis and hypoxia is was likely because she had was unable to use BiPAP due to nausea and vomiting so due to underlying bronchitis/asthma flare- asked PCCM to assist with management as I suspected she may need to be intubated-the patient declined intubation - subsequently agreed to use BiPAP and luckily did not vomit when BiPAP was placed - CT chest and admission did not reveal acute findings per radiology report- pulmonary feels that it did show pulmonary edema and has started the patient on Lasix  - in negative balance by 10 liters since admission- interestingly BNP was normal and echo did not reveal any diastolic heart failure - only right-sided heart failure -She was requiring a Ventimask initially-O2 requirements improved significantly and down to 2 L today-she states 3 L is her baseline baseline - Taper prednisone to 20 mg tomorrow morning and 10 mg the next day and then stop - keep pulse ox at  88-90% as she has CO2 retention issues  Acute Bronchitis - Levaquin x 5 days- see above  OHS/ OSA - lost her CPAP- needs another sleep study and CPAP/ BiPAP - have called and scheduled a sleep study under Dr Bari Mantis name (per his request)- Oct 3rd Monday 8PM- office to fax information to the SDU-  - have asked case management to assist with obtaining BiPAP at home    Diastolic CHF??  - BNP only 23.2 - ECHO reveals normal systolic and diastolic function but has pulm HTN and right heart failure - CT chest per radiology report does not reveal pulm edema (discussed with 2 separate radiologists)  Pulmonary hypertension and right heart failure - Likely from morbid obesity and lack of C Pap use in setting of sleep apnea - suspect pedal edema is from this and venous stasis - diuretics given per pulmonary-10 L negative balance-extremities less swollen   Lower extremity edema - see above- this is most likely venous stasis and right heart failure -will need Ace wraps prior to discharge  Hyperkalemia -Potassium elevated 8/26 for unknown reasons-resolved after Kayexalate  Vomiting - When necessary Zofran- abdominal exam benign- improving   Hypotension - Lisinopril on hold as she was initially hypotensive and subsequently hyperkalemic -Resume lisinopril today  - Continue when necessary hydralazine  Chronic kidney disease stage 3  -Stable  Darier's disease- autosomal dominant skin disorder - rash noted on body which appears may be consistent with this- not on appropriate treatment- retinoids are what are recommended with antibiotics or topical retinoids for flares -significant improvement in rash with  5 days of Levaquin -  referral to dermatology  requested  Homeless - currently living in shelter and has a case manager who is assisting her to obtaining housing again - have alerted case management - will also get PT eval   Morbid obesity - states she has been trying to lose weight  and has lost 25 lbs in 3 wks- refuses a low-fat diet   Appt with PCP: Requested for next week Code Status:     Code Status Orders        Start     Ordered   11/16/14 0348  Full code   Continuous     11/16/14 0347     Disposition Plan: ---continue to follow in step down unit DVT prophylaxis: Lovenox Consultants: Pulmonary critical care Procedures:  Antibiotics: Anti-infectives    Start     Dose/Rate Route Frequency Ordered Stop   11/19/14 1000  levofloxacin (LEVAQUIN) tablet 500 mg     500 mg Oral Daily 11/19/14 0943 11/20/14 1017   11/16/14 1200  levofloxacin (LEVAQUIN) tablet 500 mg  Status:  Discontinued     500 mg Oral Daily 11/16/14 1041 11/19/14 0842      Objective: Filed Weights   11/15/14 2252 11/16/14 0345 11/20/14 0409  Weight: 187.789 kg (414 lb) 198.675 kg (438 lb) 194.14 kg (428 lb)    Intake/Output Summary (Last 24 hours) at 11/20/14 1101 Last data filed at 11/20/14 1000  Gross per 24 hour  Intake   1040 ml  Output   3200 ml  Net  -2160 ml     Vitals Filed Vitals:   11/20/14 0409 11/20/14 0412 11/20/14 0800 11/20/14 0926  BP:      Pulse:  89 84   Temp:   97.7 F (36.5 C)   TempSrc:   Oral   Resp:  21 18   Height:      Weight: 194.14 kg (428 lb)     SpO2:  93% 94% 93%    Exam:  General:  Pt is alert, not in acute distress  HEENT: No icterus, No thrush, oral mucosa moist  Cardiovascular: regular rate and rhythm, S1/S2 No murmur  Respiratory: CTA b/l  no respiratory distress  Abdomen: Soft, +Bowel sounds, non tender, non distended, no guarding  MSK: No LE edema, cyanosis or clubbing  Data Reviewed: Basic Metabolic Panel:  Recent Labs Lab 11/17/14 0416 11/17/14 0620 11/18/14 0736 11/19/14 0403 11/20/14 0401  NA 140 140 142 143 141  K 6.3* 5.7* 4.8 4.0 4.6  CL 102 101 97* 92* 89*  CO2 32 34* 42* 45* 43*  GLUCOSE 147* 130* 119* 99 89  BUN 19 18 18 19 20   CREATININE 1.21* 1.25* 1.07* 1.14* 1.12*  CALCIUM 8.2* 8.2* 7.9*  8.1* 7.9*  MG  --   --   --  2.1 2.2  PHOS  --   --   --  3.4 3.8   Liver Function Tests:  Recent Labs Lab 11/15/14 1632 11/15/14 2023  AST 36 33  ALT 15 16  ALKPHOS 53 49  BILITOT 0.5 0.4  PROT 7.0 6.5  ALBUMIN 3.5 3.2*   No results for input(s): LIPASE, AMYLASE in the last 168 hours. No results for input(s): AMMONIA in the last 168 hours. CBC:  Recent Labs Lab 11/15/14 1632 11/15/14 2023 11/16/14 0425 11/17/14 0620 11/18/14 0736 11/19/14 0403  WBC 8.1 9.8 7.4 15.8* 11.6* 9.6  NEUTROABS 5.7  --   --   --   --   --  HGB 11.3* 10.5* 11.0* 11.4* 10.5* 10.2*  HCT 38.6 36.5 37.8 40.7 37.9 36.1  MCV 96.7 96.1 97.7 101.0* 100.5* 98.6  PLT 286 251 236 323 217 218   Cardiac Enzymes:  Recent Labs Lab 11/16/14 0425 11/16/14 1032 11/16/14 1455  TROPONINI <0.03 <0.03 <0.03   BNP (last 3 results)  Recent Labs  07/08/14 0132 09/22/14 1628 11/15/14 1738  BNP 108.9* 106.9* 23.2    ProBNP (last 3 results)  Recent Labs  01/13/14 2108  PROBNP 541.0*    CBG: No results for input(s): GLUCAP in the last 168 hours.  Recent Results (from the past 240 hour(s))  MRSA PCR Screening     Status: Abnormal   Collection Time: 11/16/14  3:45 AM  Result Value Ref Range Status   MRSA by PCR POSITIVE (A) NEGATIVE Final    Comment:        The GeneXpert MRSA Assay (FDA approved for NASAL specimens only), is one component of a comprehensive MRSA colonization surveillance program. It is not intended to diagnose MRSA infection nor to guide or monitor treatment for MRSA infections. RESULT CALLED TO, READ BACK BY AND VERIFIED WITH: Ruffin Frederick RN AT 08.25.16 BY Dreama Saa      Studies: Dg Chest Port 1 View  11/19/2014   CLINICAL DATA:  Followup of pulmonary edema. Asthma. Hypertension. Morbid obesity.  EXAM: PORTABLE CHEST - 1 VIEW  COMPARISON:  11/15/2014  FINDINGS: degraded exam due to AP portable technique and patient body habitus. Midline trachea. Cardiomegaly accentuated  by AP portable technique. Atherosclerosis in the transverse aorta. No right and no definite left pleural effusion. No pneumothorax. Interstitial edema is likely similar, given improved inspiratory effort on the current exam. Probable left base atelectasis versus superimposition of overlying soft tissues.  IMPRESSION: Cardiomegaly with persistent congestive heart failure.  Decreased sensitivity and specificity exam due to technique related factors, as described above.   Electronically Signed   By: Abigail Miyamoto M.D.   On: 11/19/2014 08:46    Scheduled Meds:  Scheduled Meds: . allopurinol  100 mg Oral q morning - 10a  . aspirin  325 mg Oral q morning - 10a  . atorvastatin  20 mg Oral q morning - 10a  . Chlorhexidine Gluconate Cloth  6 each Topical Q0600  . darifenacin  7.5 mg Oral Daily  . enoxaparin (LOVENOX) injection  100 mg Subcutaneous Q24H  . gabapentin  100 mg Oral TID  . guaiFENesin  600 mg Oral BID  . guaiFENesin-dextromethorphan  10 mL Oral QID  . hydrocerin   Topical TID  . ipratropium-albuterol  3 mL Nebulization QID  . mupirocin ointment  1 application Nasal BID  . [START ON 11/21/2014] predniSONE  20 mg Oral Q breakfast  . sodium chloride  3 mL Intravenous Q12H   Continuous Infusions:   Time spent on care of this patient: 35 min   [place], MD 11/20/2014, 11:01 AM  LOS: 4 days   [facility] Office  [phone] Pager - Text Page per [email or web address] If 7PM-7AM, please contact night-coverage [email or web address]

## 2014-11-20 NOTE — Clinical Social Work Note (Signed)
Clinical Social Work Assessment  Patient Details  Name: Kara Vincent MRN: [id number] Date of Birth: 04-12-1965  Date of referral:  11/20/14               Reason for consult:  Discharge Planning                Permission sought to share information with:    Permission granted to share information::     Name::        Agency::     Relationship::     Contact Information:     Housing/Transportation Living arrangements for the past 2 months:  Homeless Source of Information:  Patient, Other (Comment Required) Equities trader) Patient Interpreter Needed:  None Criminal Activity/Legal Involvement Pertinent to Current Situation/Hospitalization:  No - Comment as needed Significant Relationships:  None Lives with:  Other (Comment) (Pt had been staying in the lobby shelter.) Do you feel safe going back to the place where you live?  Yes Need for family participation in patient care:  No (Coment)  Care giving concerns: Homelessness with medical issues   Social Worker assessment / plan:  Pt hospitalized on 11/15/14 with asthma exacerbation and acute on chronic respiratory failure. CSW met with pt to assist with d/c planning. Pt reports she has been staying at the [facility]. Director of the shelter contacted CSW and reported that pt was staying in their  lobby because there were no beds available. Director does not fore see any openings in the near future and does not think pt should return to the shelter lobby at d/c. CSW reported this to pt. Pt found this info upsetting. " I'm not going to any assisted living place. I'm not going out of town to another shelter. I want to go back to [facility]. " Pt denies having any family or friends that can assist her with a place to stay. Pt is declining  placement at this time. " I'll just go to a motel once I get my Social security check. "  CSW will see pt again once PT evaluates and provides recommendations.  Employment status:   Disabled (Comment on whether or not currently receiving Disability) Insurance information:  Medicaid In [place] PT Recommendations:  Not assessed at this time Information / Referral to community resources:     Patient/Family's Response to care:  Pt would like to return to [facility] and wait for an opening.  Patient/Family's Understanding of and Emotional Response to Diagnosis, Current Treatment, and Prognosis:  Pt is angry that she may not be able to return to [facility]. MD is recommending BiPAP machine at night  which cannot be provided in the lobby of [facility]. Pt still insists this is where she wants to return at d/c.  Emotional Assessment Appearance:  Appears older than stated age Attitude/Demeanor/Rapport:  Angry Affect (typically observed):  Anxious Orientation:  Oriented to Self, Oriented to Place, Oriented to  Time, Oriented to Situation Alcohol / Substance use:  Never Used Psych involvement (Current and /or in the community):  No (Comment)  Discharge Needs  Concerns to be addressed:  Homelessness Readmission within the last 30 days:  No Current discharge risk:  Homeless Barriers to Discharge:  Homeless with medical needs   Loraine Maple  [phone] 11/20/2014, 4:12 PM

## 2014-11-20 NOTE — Progress Notes (Signed)
RT discussed the importance of wearing BIPAP QHS for OSA. She understands but has a negative attitude towards the BIPAP. The patient is refusing at this time due to increased productive cough. Her RN did say when the patient goes to sleep she does drop her SATS. I told her to please call RT if this happens and we will put patient on RT. RT will monitor.

## 2014-11-20 NOTE — Plan of Care (Signed)
Problem: Phase I Progression Outcomes Goal: OOB as tolerated unless otherwise ordered Outcome: Progressing UP WITH HELP TO cardiac chair

## 2014-11-21 ENCOUNTER — Emergency Department (HOSPITAL_COMMUNITY)
Admission: EM | Admit: 2014-11-21 | Discharge: 2014-11-21 | Disposition: A | Discharge status: Home / Self Care | Payer: Medicaid Other | Attending: Emergency Medicine | Admitting: Emergency Medicine

## 2014-11-21 ENCOUNTER — Encounter (HOSPITAL_COMMUNITY): Payer: Self-pay | Admitting: Emergency Medicine

## 2014-11-21 ENCOUNTER — Emergency Department (HOSPITAL_COMMUNITY): Payer: Medicaid Other

## 2014-11-21 DIAGNOSIS — Z7952 Long term (current) use of systemic steroids: Secondary | ICD-10-CM | POA: Insufficient documentation

## 2014-11-21 DIAGNOSIS — G8929 Other chronic pain: Secondary | ICD-10-CM | POA: Insufficient documentation

## 2014-11-21 DIAGNOSIS — Z872 Personal history of diseases of the skin and subcutaneous tissue: Secondary | ICD-10-CM | POA: Insufficient documentation

## 2014-11-21 DIAGNOSIS — Z8614 Personal history of Methicillin resistant Staphylococcus aureus infection: Secondary | ICD-10-CM | POA: Insufficient documentation

## 2014-11-21 DIAGNOSIS — Q828 Other specified congenital malformations of skin: Secondary | ICD-10-CM | POA: Insufficient documentation

## 2014-11-21 DIAGNOSIS — Z87891 Personal history of nicotine dependence: Secondary | ICD-10-CM | POA: Insufficient documentation

## 2014-11-21 DIAGNOSIS — Z7982 Long term (current) use of aspirin: Secondary | ICD-10-CM | POA: Insufficient documentation

## 2014-11-21 DIAGNOSIS — G4733 Obstructive sleep apnea (adult) (pediatric): Secondary | ICD-10-CM | POA: Insufficient documentation

## 2014-11-21 DIAGNOSIS — Z79899 Other long term (current) drug therapy: Secondary | ICD-10-CM | POA: Insufficient documentation

## 2014-11-21 DIAGNOSIS — I509 Heart failure, unspecified: Secondary | ICD-10-CM | POA: Insufficient documentation

## 2014-11-21 DIAGNOSIS — Z87448 Personal history of other diseases of urinary system: Secondary | ICD-10-CM | POA: Insufficient documentation

## 2014-11-21 DIAGNOSIS — J9610 Chronic respiratory failure, unspecified whether with hypoxia or hypercapnia: Secondary | ICD-10-CM | POA: Diagnosis not present

## 2014-11-21 DIAGNOSIS — M109 Gout, unspecified: Secondary | ICD-10-CM | POA: Insufficient documentation

## 2014-11-21 DIAGNOSIS — J45901 Unspecified asthma with (acute) exacerbation: Secondary | ICD-10-CM | POA: Insufficient documentation

## 2014-11-21 DIAGNOSIS — Z880 Allergy status to penicillin: Secondary | ICD-10-CM | POA: Insufficient documentation

## 2014-11-21 DIAGNOSIS — M1990 Unspecified osteoarthritis, unspecified site: Secondary | ICD-10-CM | POA: Insufficient documentation

## 2014-11-21 DIAGNOSIS — Z9981 Dependence on supplemental oxygen: Secondary | ICD-10-CM | POA: Insufficient documentation

## 2014-11-21 DIAGNOSIS — Z590 Homelessness: Secondary | ICD-10-CM | POA: Insufficient documentation

## 2014-11-21 DIAGNOSIS — I10 Essential (primary) hypertension: Secondary | ICD-10-CM | POA: Insufficient documentation

## 2014-11-21 DIAGNOSIS — E785 Hyperlipidemia, unspecified: Secondary | ICD-10-CM | POA: Insufficient documentation

## 2014-11-21 LAB — BLOOD GAS, ARTERIAL
ACID-BASE EXCESS: 14.3 mmol/L — AB (ref 0.0–2.0)
Bicarbonate: 41.1 mEq/L — ABNORMAL HIGH (ref 20.0–24.0)
Drawn by: 295031
FIO2: 0.21
O2 Saturation: 95.9 %
PATIENT TEMPERATURE: 98.6
PCO2 ART: 62.4 mmHg — AB (ref 35.0–45.0)
PO2 ART: 80.9 mmHg (ref 80.0–100.0)
TCO2: 36.9 mmol/L (ref 0–100)
pH, Arterial: 7.434 (ref 7.350–7.450)

## 2014-11-21 LAB — CBC WITH DIFFERENTIAL/PLATELET
BASOS ABS: 0 10*3/uL (ref 0.0–0.1)
Basophils Relative: 0 % (ref 0–1)
Eosinophils Absolute: 0 10*3/uL (ref 0.0–0.7)
Eosinophils Relative: 0 % (ref 0–5)
HEMATOCRIT: 39.2 % (ref 36.0–46.0)
Hemoglobin: 11.5 g/dL — ABNORMAL LOW (ref 12.0–15.0)
LYMPHS PCT: 10 % — AB (ref 12–46)
Lymphs Abs: 1.3 10*3/uL (ref 0.7–4.0)
MCH: 28.2 pg (ref 26.0–34.0)
MCHC: 29.3 g/dL — ABNORMAL LOW (ref 30.0–36.0)
MCV: 96.1 fL (ref 78.0–100.0)
Monocytes Absolute: 0.5 10*3/uL (ref 0.1–1.0)
Monocytes Relative: 4 % (ref 3–12)
NEUTROS ABS: 10.8 10*3/uL — AB (ref 1.7–7.7)
Neutrophils Relative %: 86 % — ABNORMAL HIGH (ref 43–77)
PLATELETS: 243 10*3/uL (ref 150–400)
RBC: 4.08 MIL/uL (ref 3.87–5.11)
RDW: 14.2 % (ref 11.5–15.5)
WBC: 12.6 10*3/uL — AB (ref 4.0–10.5)

## 2014-11-21 LAB — COMPREHENSIVE METABOLIC PANEL
ALT: 14 U/L (ref 14–54)
ANION GAP: 5 (ref 5–15)
AST: 17 U/L (ref 15–41)
Albumin: 3.2 g/dL — ABNORMAL LOW (ref 3.5–5.0)
Alkaline Phosphatase: 52 U/L (ref 38–126)
BILIRUBIN TOTAL: 0.6 mg/dL (ref 0.3–1.2)
BUN: 21 mg/dL — ABNORMAL HIGH (ref 6–20)
CO2: 41 mmol/L — ABNORMAL HIGH (ref 22–32)
Calcium: 8.4 mg/dL — ABNORMAL LOW (ref 8.9–10.3)
Chloride: 94 mmol/L — ABNORMAL LOW (ref 101–111)
Creatinine, Ser: 1.08 mg/dL — ABNORMAL HIGH (ref 0.44–1.00)
GFR, EST NON AFRICAN AMERICAN: 59 mL/min — AB (ref >60)
Glucose, Bld: 119 mg/dL — ABNORMAL HIGH (ref 65–99)
POTASSIUM: 4.5 mmol/L (ref 3.5–5.1)
Sodium: 140 mmol/L (ref 135–145)
TOTAL PROTEIN: 6.5 g/dL (ref 6.5–8.1)

## 2014-11-21 LAB — BRAIN NATRIURETIC PEPTIDE: B Natriuretic Peptide: 146.9 pg/mL — ABNORMAL HIGH (ref 0.0–100.0)

## 2014-11-21 MED ORDER — IPRATROPIUM-ALBUTEROL 18-103 MCG/ACT IN AERO: 2.0000 | INHALATION_SPRAY | RESPIRATORY_TRACT | Status: DC | PRN

## 2014-11-21 MED ORDER — BUDESONIDE-FORMOTEROL FUMARATE 160-4.5 MCG/ACT IN AERO: 2.0000 | INHALATION_SPRAY | Freq: Two times a day (BID) | RESPIRATORY_TRACT | Status: DC

## 2014-11-21 MED ORDER — ACETAMINOPHEN 500 MG PO TABS
1000.0000 mg | ORAL_TABLET | Freq: Once | ORAL | Status: AC
  Administered 2014-11-21: 1000 mg via ORAL
  Filled 2014-11-21: qty 2

## 2014-11-21 MED ORDER — PREDNISONE 20 MG PO TABS: 20.0000 mg | ORAL_TABLET | Freq: Every day | ORAL | Status: DC

## 2014-11-21 MED ORDER — GUAIFENESIN ER 600 MG PO TB12: 600.0000 mg | ORAL_TABLET | Freq: Two times a day (BID) | ORAL | Status: DC

## 2014-11-21 MED ORDER — IPRATROPIUM-ALBUTEROL 0.5-2.5 (3) MG/3ML IN SOLN
3.0000 mL | Freq: Once | RESPIRATORY_TRACT | Status: AC
  Administered 2014-11-21: 3 mL via RESPIRATORY_TRACT
  Filled 2014-11-21: qty 3

## 2014-11-21 NOTE — Progress Notes (Signed)
Per chart review patient is to be seen at the Transitional Care clinic at the [facility] on 09/01 at 0945am to see Dr. Jarold Song.

## 2014-11-21 NOTE — Progress Notes (Signed)
Attempted to call and schedule f/u with [facility] dermatology, was told that they would schedule appt. Patient would have to call themselves to discuss a f/u appt.

## 2014-11-21 NOTE — Progress Notes (Signed)
Discussed in depth discharge instructions with patient she verbalized agreement and understanding.  Patient's IV was discontinued with no complications. Patient given bus passes and information with resources.  Patient walked out with all belongings.

## 2014-11-21 NOTE — Progress Notes (Signed)
EDP discussed pt with ED [facility]  ED CM spoke with ED SW

## 2014-11-21 NOTE — ED Notes (Signed)
This Agricultural consultant was notified by ICU staff that the Pt was just discharged this afternoon.  ICU staff reports that "it took around 3 hours to discharge the Pt, because she did not want to leave.  She kept making excuses to stay longer."  The Pt was taken to the bus stop by ICU staff and they confirmed that she has a bus pass.  A short time later, the Pt was witnessed coming back toward the ED.

## 2014-11-21 NOTE — Progress Notes (Signed)
PT Cancellation Note  Patient Details Name: Kara Vincent MRN: [id number] DOB: 1965-07-05   Cancelled Treatment:    Reason Eval/Treat Not Completed: Other (comment) (RN stated  pt is [place] and doesn't need PT. )   Blondell Reveal Kistler 11/21/2014, 1:17 PM [phone]

## 2014-11-21 NOTE — Discharge Summary (Addendum)
Physician Discharge Summary  Kara Vincent [id number] DOB: Dec 23, 1965 DOA: 11/15/2014  PCP: Kara Junior, MD  Admit date: 11/15/2014 Discharge date: 11/21/2014  Time spent: 65 minutes  Recommendations for Outpatient Follow-up:  1. High risk for readmission-very poor insight into medical problems-patient is quite resistant to education about her medical illnesses-refuses to comply with BiPAP despite being explained multiple times the reason for it, participate in rehabilitation,refused removal of Foley catheter while in the hospital - refused to comply with a heart healthy diet-this was then changed to only a low sodium diet which she again refuses to comply with and demanded to be put on a regular diet 2. F/u appt with transitional care clinic and with Kara Kara Vincent made 3. Do not prescribe narcotics and attempt to wean off of benzodiazepine if possible  Discharge Condition: Stable Diet recommendation: Low sodium heart healthy diet  Discharge Diagnoses:  Principal Problem:   Acute on chronic respiratory failure with hypoxia Active Problems:   Asthma exacerbation   Acute bronchitis   Narcotic and benzodiazepine seeker   Morbid obesity   Hypotension   Venous stasis   CKD (chronic kidney disease) stage 3, GFR 30-59 ml/min   Right-sided heart failure   OSA (obstructive sleep apnea)   History of present illness:  Kara Vincent is a 49 y.o. female with morbid obesity, chronic respiratory failure on 3 L of oxygen at home (likely due to OHS), asthma, diastolic heart failure, hypertension,Darier's Disease and obstructive sleep apnea who does not wear C Pap who presented to the hospital for shortness of breath.  Hospital Course:  Principal Problem:   Acute on chronic respiratory failure with hypoxia (A) Asthma exacerbation (B) Acute Bronchitis -Started on steroids on admission along with nebulizer treatments - started Levaquin for acute bronchitis on 8/25 cough due to with  green mucous- allergic to may other preferred meds- completed a 5 day course -ABG 8/26 showed CO2 retention, acidosis and hypoxia is was likely because she had was unable to use BiPAP due to nausea and vomiting so due to underlying bronchitis/asthma flare- asked PCCM to assist with management as I suspected she may need to be intubated-the patient declined intubation - subsequently agreed to use BiPAP and luckily did not vomit when BiPAP was placed - CT chest and admission did not reveal acute findings per radiology report- pulmonary feels that it did show pulmonary edema and  started the patient on Lasix - in negative balance by 11 liters since admission- interestingly BNP was normal and echo did not reveal any diastolic heart failure - revealed only right-sided heart failure -She was requiring a Ventimask initially-O2 requirements improved significantly and down to 2 L today-she states 3 L is her baseline requirement - Taper prednisone-we'll stop in 3 days -I have also started Symbicort and Combivent which her Medicaid should hopefully pay for - keep pulse ox at 88-90% as she has CO2 retention issues - ABG checked prior to discharge reveals her baseline PCO2 is about 60- PO2 on room air is 80 which means she does not need O2 while awake - advised to use O2 at night if not using BiPAP- explained to patient  OHS/ OSA - lost her CPAP (I suspect this happened when she went to the shelter) and has had difficult complying with BiPAP while in the hospital - needs another sleep study and a BiPAP - have called and scheduled a sleep study under Kara Vincent (per his request)- Oct 3rd Monday 8PM- office to fax  information to the SDU-  - have asked case management to assist with obtaining BiPAP at home - they state that [facility] would be able to provide it, but the co-pay would be $200 which the patient states she cannot afford - she refuses to go to rehab where she would not have issues with  obtaining a BiPAP or issues with homelessness-    Diastolic CHF??  -Diagnosis on the chart, possibly inaccurate - BNP only 23.2 - ECHO reveals normal systolic and diastolic function but has pulm HTN and right heart failure - CT chest per radiology report does not reveal pulm edema (discussed with 2 separate radiologists)  Pulmonary hypertension and right heart failure - Likely from morbid obesity and lack of C Pap use in setting of sleep apnea - suspect pedal edema is from this and venous stasis - diuretics given per pulmonary-11 L negative balance-extremities less swollen   Lower extremity edema - see above- this is most likely venous stasis and right heart failure -will need Ace wraps prior to discharge  Hyperkalemia -Potassium elevated 8/26 for unknown reasons-resolved after Kayexalate  Vomiting -- abdominal exam benign- resolved   Hypotension - Lisinopril held as she was initially hypotensive and subsequently hyperkalemic -Resumed lisinopril 8/29   Chronic kidney disease stage 3  -Stable  Darier's disease- autosomal dominant skin disorder - rash noted on body which appears may be consistent with this- not on appropriate treatment- retinoids are what are recommended with antibiotics or topical retinoids for flares -significant improvement in rash with 5 days of Levaquin - referral to dermatologyrequested but per office, patient will need to call and make appt herself  Homeless - currently living in shelter and has a case manager who is assisting her to obtaining housing again -she refuses to go to Rehab although she does qualify- she will be returning to a shelter-she states she gets a check from the government in 2 days and will likely go to a motel once she gets this checked   Morbid obesity Body mass index is 67.34 kg/(m^2). - states she has been trying to lose weight and has lost 25 lbs in 3 wks- refuses a healthy diet  Limited mobility -Refuses  rehabilitation-has a motorized wheelchair  Chronic pain? -Has been demanding Vicodin throughout the hospital stay for various different reasons and now asking for prescription on discharge because she continues to have pain in her legs  -Due to severe sleep apnea and the fact that she does not use of BiPAP, I have explained to her that I will not not prescribe any narcotics  Anxiety? -Apparently gets Xanax prescribed to her by someone-I did discuss tapering it which she was adamantly against-we have continued it throughout her hospital stay to prevent withdrawal - I have not given her prescription for it  Consultations:  Pulmonary critical care  Discharge Exam: Filed Weights   11/16/14 0345 11/20/14 0409 11/21/14 0600  Weight: 198.675 kg (438 lb) 194.14 kg (428 lb) 189.15 kg (417 lb)   Filed Vitals:   11/21/14 1000  BP: 112/66  Pulse:   Temp:   Resp: 24    General: AAO x 3, no distress Cardiovascular: RRR, no murmurs  Respiratory: clear to auscultation bilaterally GI: soft, non-tender, non-distended, bowel sound positive  Discharge Instructions You were cared for by a hospitalist during your hospital stay. If you have any questions about your discharge medications or the care you received while you were in the hospital after you are discharged, you  can call the unit and asked to speak with the hospitalist on call if the hospitalist that took care of you is not available. Once you are discharged, your primary care physician will handle any further medical issues. Please note that NO REFILLS for any discharge medications will be authorized once you are discharged, as it is imperative that you return to your primary care physician (or establish a relationship with a primary care physician if you do not have one) for your aftercare needs so that they can reassess your need for medications and monitor your lab values.      Discharge Instructions    Diet - low sodium heart healthy     Complete by:  As directed      Increase activity slowly    Complete by:  As directed             Medication List       Medication List    STOP taking these medications        albuterol 108 (90 BASE) MCG/ACT inhaler  Commonly known as:  PROVENTIL HFA;VENTOLIN HFA     gabapentin 300 MG capsule  Commonly known as:  NEURONTIN      TAKE these medications        albuterol-ipratropium 18-103 MCG/ACT inhaler  Commonly known as:  COMBIVENT  Inhale 2 puffs into the lungs every 4 (four) hours as needed for wheezing or shortness of breath.     allopurinol 100 MG tablet  Commonly known as:  ZYLOPRIM  Take 1 tablet (100 mg total) by mouth every morning.     ALPRAZolam 1 MG tablet  Commonly known as:  XANAX  Take 1 mg by mouth at bedtime as needed for anxiety (anxiety).     ammonium lactate 12 % cream  Commonly known as:  AMLACTIN  Apply 1 g topically daily as needed for dry skin.     aspirin 325 MG tablet  Take 325 mg by mouth every morning.     atorvastatin 20 MG tablet  Commonly known as:  LIPITOR  Take 1 tablet (20 mg total) by mouth every morning.     budesonide-formoterol 160-4.5 MCG/ACT inhaler  Commonly known as:  SYMBICORT  Inhale 2 puffs into the lungs 2 (two) times daily.     clotrimazole 1 % cream  Commonly known as:  LOTRIMIN  Apply to affected area 2 times daily     diphenhydrAMINE 25 mg capsule  Commonly known as:  BENADRYL  Take 25 mg by mouth every 6 (six) hours as needed for itching or allergies (allergies).     eucerin lotion  Apply 5 mLs topically 2 (two) times daily as needed for dry skin.     folic acid 1 MG tablet  Commonly known as:  FOLVITE  Take 1 tablet (1 mg total) by mouth daily.     furosemide 40 MG tablet  Commonly known as:  LASIX  Take 1 tablet (40 mg total) by mouth 2 (two) times daily.     guaiFENesin 600 MG 12 hr tablet  Commonly known as:  MUCINEX  Take 1 tablet (600 mg total) by mouth 2 (two) times daily.      hydrOXYzine 25 MG tablet  Commonly known as:  ATARAX/VISTARIL  Take 25 mg by mouth 3 (three) times daily as needed for anxiety or itching (anxiety and itching).     ketoconazole 2 % shampoo  Commonly known as:  NIZORAL  Apply 1 application topically daily  as needed for irritation (irritation).     ketoconazole 200 MG tablet  Commonly known as:  NIZORAL  Take 200 mg by mouth every morning.     lisinopril 40 MG tablet  Commonly known as:  PRINIVIL,ZESTRIL  Take 40 mg by mouth daily.     multivitamin with minerals Tabs tablet  Take 1 tablet by mouth daily.     pantoprazole 40 MG tablet  Commonly known as:  PROTONIX  Take 1 tablet (40 mg total) by mouth every morning.     predniSONE 20 MG tablet  Commonly known as:  DELTASONE  Take 1 tablet (20 mg total) by mouth daily with breakfast.     solifenacin 5 MG tablet  Commonly known as:  VESICARE  Take 5 mg by mouth every morning.     vitamin B-12 1000 MCG tablet  Commonly known as:  CYANOCOBALAMIN  Take 1 tablet (1,000 mcg total) by mouth every morning.     vitamin E 100 UNIT capsule  Take 1 capsule (100 Units total) by mouth every morning.        Allergies  Allergen Reactions  . Ibuprofen Hives, Shortness Of Breath and Palpitations  . Adhesive [Tape] Rash  . Azithromycin Hives and Rash  . Doxycycline Rash  . Penicillins Rash  . Sulfa Antibiotics Rash  . Sulfamethoxazole-Trimethoprim Rash   Follow-up Information    Follow up with [facility] on 11/23/2014.   Specialty:  Internal Medicine   Why:  at 9:45am with Kara Jarold Song with the [place] information:   [place] Terald Sleeper [id number] [place] [phone]       The results of significant diagnostics from this hospitalization (including imaging, microbiology, ancillary and laboratory) are listed below for reference.    Significant Diagnostic Studies: Dg Chest 2 View  11/15/2014    CLINICAL DATA:  Right-sided chest pain and shortness of breath. Lower extremity swelling.  EXAM: CHEST  2 VIEW  COMPARISON:  09/22/2014  FINDINGS: Low lung volumes are again noted. Cardiomegaly and pulmonary vascular congestion appears stable. No evidence of pulmonary consolidation or overt pulmonary edema. No evidence of pleural effusion.  IMPRESSION: Stable low lung volumes, with cardiomegaly and pulmonary vascular congestion. No acute findings.   Electronically Signed   By: Earle Gell M.D.   On: 11/15/2014 18:10   Ct Angio Chest Pe W/cm &/or Wo Cm  11/15/2014   CLINICAL DATA:  Leg swelling and shortness of breath. Elevated D-dimer.  EXAM: CT ANGIOGRAPHY CHEST WITH CONTRAST  TECHNIQUE: Multidetector CT imaging of the chest was performed using the standard protocol during bolus administration of intravenous contrast. Multiplanar CT image reconstructions and MIPs were obtained to evaluate the vascular anatomy.  CONTRAST:  178mL OMNIPAQUE IOHEXOL 350 MG/ML SOLN  COMPARISON:  Radiographs earlier this day.  Chest CT 03/14/2012  FINDINGS: No central pulmonary embolus involving the main or lobar pulmonary arteries, technically fair/poor study. The segmental and subsegmental branches cannot be assessed due to contrast bolus timing and soft tissue attenuation from body habitus.  There is mild multi chamber cardiomegaly. No discrete right heart strain or contrast refluxing into the IVC. Thoracic aorta is normal in caliber with mild atherosclerosis. Scattered minimal coronary artery calcifications. There is no pleural or pericardial effusion. Prominent upper paratracheal lymph node measuring 10 mm short axis dimension, unchanged. There is a prominent prevascular lymph node measuring 9 mm short axis dimension, likely reactive. No definite hilar adenopathy.  Limited assessment of lung parenchyma, no consolidation to suggest pneumonia. No dominant pulmonary mass. Mild dependent and lingular atelectasis.  No acute  abnormality in the included upper abdomen. Probable hepatomegaly.  There are no acute or suspicious osseous abnormalities. There is degenerative change in the spine.  Review of the MIP images confirms the above findings.  IMPRESSION: 1. No central pulmonary embolus in the main or lobar pulmonary arteries. The segmental and subsegmental branches cannot be assessed. 2. No acute findings.  Cardiomegaly and scattered atelectasis.   Electronically Signed   By: Jeb Levering M.D.   On: 11/15/2014 23:42   Dg Chest Port 1 View  11/19/2014   CLINICAL DATA:  Followup of pulmonary edema. Asthma. Hypertension. Morbid obesity.  EXAM: PORTABLE CHEST - 1 VIEW  COMPARISON:  11/15/2014  FINDINGS: degraded exam due to AP portable technique and patient body habitus. Midline trachea. Cardiomegaly accentuated by AP portable technique. Atherosclerosis in the transverse aorta. No right and no definite left pleural effusion. No pneumothorax. Interstitial edema is likely similar, given improved inspiratory effort on the current exam. Probable left base atelectasis versus superimposition of overlying soft tissues.  IMPRESSION: Cardiomegaly with persistent congestive heart failure.  Decreased sensitivity and specificity exam due to technique related factors, as described above.   Electronically Signed   By: Abigail Miyamoto M.D.   On: 11/19/2014 08:46    Microbiology: Recent Results (from the past 240 hour(s))  MRSA PCR Screening     Status: Abnormal   Collection Time: 11/16/14  3:45 AM  Result Value Ref Range Status   MRSA by PCR POSITIVE (A) NEGATIVE Final    Comment:        The GeneXpert MRSA Assay (FDA approved for NASAL specimens only), is one component of a comprehensive MRSA colonization surveillance program. It is not intended to diagnose MRSA infection nor to guide or monitor treatment for MRSA infections. RESULT CALLED TO, READ BACK BY AND VERIFIED WITH: SQuentin Cornwall RN AT 08.25.16 BY SHUEA      Labs: Basic  Metabolic Panel:  Recent Labs Lab 11/17/14 0416 11/17/14 0620 11/18/14 0736 11/19/14 0403 11/20/14 0401  NA 140 140 142 143 141  K 6.3* 5.7* 4.8 4.0 4.6  CL 102 101 97* 92* 89*  CO2 32 34* 42* 45* 43*  GLUCOSE 147* 130* 119* 99 89  BUN 19 18 18 19 20   CREATININE 1.21* 1.25* 1.07* 1.14* 1.12*  CALCIUM 8.2* 8.2* 7.9* 8.1* 7.9*  MG  --   --   --  2.1 2.2  PHOS  --   --   --  3.4 3.8   Liver Function Tests:  Recent Labs Lab 11/15/14 1632 11/15/14 2023  AST 36 33  ALT 15 16  ALKPHOS 53 49  BILITOT 0.5 0.4  PROT 7.0 6.5  ALBUMIN 3.5 3.2*   No results for input(s): LIPASE, AMYLASE in the last 168 hours. No results for input(s): AMMONIA in the last 168 hours. CBC:  Recent Labs Lab 11/15/14 1632 11/15/14 2023 11/16/14 0425 11/17/14 0620 11/18/14 0736 11/19/14 0403  WBC 8.1 9.8 7.4 15.8* 11.6* 9.6  NEUTROABS 5.7  --   --   --   --   --   HGB 11.3* 10.5* 11.0* 11.4* 10.5* 10.2*  HCT 38.6 36.5 37.8 40.7 37.9 36.1  MCV 96.7 96.1 97.7 101.0* 100.5* 98.6  PLT 286 251 236 323 217 218   Cardiac Enzymes:  Recent Labs Lab 11/16/14 0425 11/16/14 1032 11/16/14 1455  TROPONINI <0.03 <  0.03 <0.03   BNP: BNP (last 3 results)  Recent Labs  07/08/14 0132 09/22/14 1628 11/15/14 1738  BNP 108.9* 106.9* 23.2    ProBNP (last 3 results)  Recent Labs  01/13/14 2108  PROBNP 541.0*    CBG: No results for input(s): GLUCAP in the last 168 hours.     SignedDebbe Odea, MD [facility] 11/21/2014, 1:31 PM

## 2014-11-21 NOTE — Progress Notes (Signed)
CSW met with pt to assist with d/c planning. Pt continues to decline placement. Pt plans to go to the [facility] for assistance at d/c. Pt gave CSW permission to contact  Director of [employer] to  let him know that she would be going to [facility] for shelter assistance. Message left for Director. CSW provided pt with bus passes. Pt will be d/c today.  Werner Lean LCSW [phone]

## 2014-11-21 NOTE — ED Provider Notes (Signed)
CSN: [id number]     Arrival date & time 11/21/14  1559 History   First MD Initiated Contact with Patient 11/21/14 1749     Chief Complaint  Patient presents with  . Shortness of Breath  . Wheezing     (Consider location/radiation/quality/duration/timing/severity/associated sxs/prior Treatment) HPI  49 year old female presents with worsening shortness of breath. She was discharged from this facility just a few hours ago. She has been admitted for the last couple days. She was just admitted for acute on chronic respiratory failure. Her discharge summary indicates that she does not make necessary adjustments to help with her chronic comorbidities including not taking the heart healthy diet or low salt diet and requesting a regular diet. She does not use her BiPAP is indicated. She chronically requests Foleys while in the ER and hospital. She states she's been having right-sided chest wall pain over the last 4-5 days. Was getting better but now seems to be getting worse. She had a negative CT scan that showed no pulmonary embolus and she was treated for her dyspnea with steroid to breathing treatments. She states she was discharged without oxygen which she states she typically wears 24/7. When I walk in the room, however she has no oxygen on her nose and is hanging around her mouth. She is requesting to be readmitted.  Past Medical History  Diagnosis Date  . Asthma   . GERD (gastroesophageal reflux disease)   . Hypertension   . Homelessness   . Low back pain   . 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 Arthritis   . Gout   . Hyperlipemia   . Morbid obesity     uses motor wheel chair  . MRSA (methicillin resistant Staphylococcus aureus)     states about a year ago  . CHF (congestive heart failure)   . Chronic abdominal pain   . Hidradenitis   . Chronic pain     "all over"  . Renal insufficiency   . On home O2     3L N/C O2 continuously  . OSA (obstructive sleep apnea)    non-compliant with CPAP   Past Surgical History  Procedure Laterality Date  . Tubal ligation    . Cesarean section     Family History  Problem Relation Age of Onset  . Asthma Mother   . Diabetes Father   . Heart disease Father   . Stroke Father    Social History  Substance Use Topics  . Smoking status: Former Smoker    Types: Cigarettes    Quit date: 03/25/2003  . Smokeless tobacco: Former Systems developer    Quit date: 05/27/1978  . Alcohol Use: No   OB History    No data available     Review of Systems  Constitutional: Negative for fever.  Respiratory: Positive for cough and shortness of breath.   Cardiovascular: Positive for chest pain.  All other systems reviewed and are negative.     Allergies  Ibuprofen; Adhesive; Azithromycin; Doxycycline; Penicillins; Sulfa antibiotics; and Sulfamethoxazole-trimethoprim  Home Medications   Prior to Admission medications   Medication Sig Start Date End Date Taking? Authorizing Provider  albuterol-ipratropium (COMBIVENT) 18-103 MCG/ACT inhaler Inhale 2 puffs into the lungs every 4 (four) hours as needed for wheezing or shortness of breath. 11/21/14  Yes Debbe Odea, MD  allopurinol (ZYLOPRIM) 100 MG tablet Take 1 tablet (100 mg total) by mouth every morning. 01/22/14  Yes Donne Hazel, MD  ALPRAZolam (  XANAX) 1 MG tablet Take 1 mg by mouth at bedtime as needed for anxiety (anxiety).   Yes Historical Provider, MD  ammonium lactate (AMLACTIN) 12 % cream Apply 1 g topically daily as needed for dry skin. 04/22/14  Yes Linton Flemings, MD  aspirin 325 MG tablet Take 325 mg by mouth every morning.    Yes Historical Provider, MD  atorvastatin (LIPITOR) 20 MG tablet Take 1 tablet (20 mg total) by mouth every morning. 01/22/14  Yes Donne Hazel, MD  clotrimazole (LOTRIMIN) 1 % cream Apply to affected area 2 times daily Patient taking differently: Apply 1 application topically 2 (two) times daily.  06/20/14  Yes Charlann Lange, PA-C  diphenhydrAMINE  (BENADRYL) 25 mg capsule Take 25 mg by mouth every 6 (six) hours as needed for itching or allergies (allergies).    Yes Historical Provider, MD  Emollient (EUCERIN) lotion Apply 5 mLs topically 2 (two) times daily as needed for dry skin. Patient taking differently: Apply 5 mLs topically 3 (three) times daily.  04/22/14  Yes Linton Flemings, MD  folic acid (FOLVITE) 1 MG tablet Take 1 tablet (1 mg total) by mouth daily. 01/22/14  Yes Donne Hazel, MD  furosemide (LASIX) 40 MG tablet Take 1 tablet (40 mg total) by mouth 2 (two) times daily. 01/22/14  Yes Donne Hazel, MD  guaiFENesin (MUCINEX) 600 MG 12 hr tablet Take 1 tablet (600 mg total) by mouth 2 (two) times daily. 11/21/14  Yes Debbe Odea, MD  hydrOXYzine (ATARAX/VISTARIL) 25 MG tablet Take 25 mg by mouth 3 (three) times daily as needed for anxiety or itching (anxiety and itching).    Yes Historical Provider, MD  ketoconazole (NIZORAL) 2 % shampoo Apply 1 application topically daily as needed for irritation (irritation).    Yes Historical Provider, MD  ketoconazole (NIZORAL) 200 MG tablet Take 200 mg by mouth every morning.    Yes Historical Provider, MD  lisinopril (PRINIVIL,ZESTRIL) 40 MG tablet Take 40 mg by mouth daily.   Yes Historical Provider, MD  Multiple Vitamin (MULTIVITAMIN WITH MINERALS) TABS tablet Take 1 tablet by mouth daily. 01/22/14  Yes Donne Hazel, MD  pantoprazole (PROTONIX) 40 MG tablet Take 1 tablet (40 mg total) by mouth every morning. 01/22/14  Yes Donne Hazel, MD  predniSONE (DELTASONE) 20 MG tablet Take 1 tablet (20 mg total) by mouth daily with breakfast. 11/21/14  Yes Debbe Odea, MD  solifenacin (VESICARE) 5 MG tablet Take 5 mg by mouth every morning.    Yes Historical Provider, MD  vitamin B-12 (CYANOCOBALAMIN) 1000 MCG tablet Take 1 tablet (1,000 mcg total) by mouth every morning. 01/22/14  Yes Donne Hazel, MD  vitamin E 100 UNIT capsule Take 1 capsule (100 Units total) by mouth every morning. 01/22/14  Yes  Donne Hazel, MD  budesonide-formoterol [facility]) 160-4.5 MCG/ACT inhaler Inhale 2 puffs into the lungs 2 (two) times daily. Patient not taking: Reported on 11/21/2014 11/21/14   Debbe Odea, MD   BP 121/42 mmHg  Pulse 87  Temp(Src) 98.4 F (36.9 C) (Oral)  Resp 22  SpO2 94%  LMP 03/24/2009 Physical Exam  Constitutional: She is oriented to person, place, and time. She appears well-developed and well-nourished.  Morbidly obese. No apparent distress. Laying on left side. Nasal cannula hanging under her chin  HENT:  Head: Normocephalic and atraumatic.  Right Ear: External ear normal.  Left Ear: External ear normal.  Nose: Nose normal.  Eyes: Right eye exhibits no discharge. Left  eye exhibits no discharge.  Cardiovascular: Normal rate, regular rhythm and normal heart sounds.   Pulmonary/Chest: Effort normal and breath sounds normal. She exhibits tenderness (right axillary chest wall).  Good air movement  Abdominal: Soft. She exhibits no distension. There is no tenderness.  Neurological: She is alert and oriented to person, place, and time.  Skin: Skin is warm and dry. She is not diaphoretic.  Nursing note and vitals reviewed.   ED Course  Procedures (including critical care time) Labs Review Labs Reviewed  COMPREHENSIVE METABOLIC PANEL - Abnormal; Notable for the following:    Chloride 94 (*)    CO2 41 (*)    Glucose, Bld 119 (*)    BUN 21 (*)    Creatinine, Ser 1.08 (*)    Calcium 8.4 (*)    Albumin 3.2 (*)    GFR calc non Af Amer 59 (*)    All other components within normal limits  CBC WITH DIFFERENTIAL/PLATELET - Abnormal; Notable for the following:    WBC 12.6 (*)    Hemoglobin 11.5 (*)    MCHC 29.3 (*)    Neutrophils Relative % 86 (*)    Neutro Abs 10.8 (*)    Lymphocytes Relative 10 (*)    All other components within normal limits  BRAIN NATRIURETIC PEPTIDE - Abnormal; Notable for the following:    B Natriuretic Peptide 146.9 (*)    All other components  within normal limits    Imaging Review Dg Chest 2 View  11/21/2014   CLINICAL DATA:  Right chest pain and dyspnea.  EXAM: CHEST  2 VIEW  COMPARISON:  November 19, 2014  FINDINGS: The heart size and mediastinal contours are stable. The heart size is enlarged. There is increased pulmonary interstitium bilaterally. There is no focal pneumonia or pleural effusion. The visualized skeletal structures are stable.  IMPRESSION: Persistent congestive heart failure.   Electronically Signed   By: Abelardo Diesel M.D.   On: 11/21/2014 18:33   I have personally reviewed and evaluated these images and lab results as part of my medical decision-making.   EKG Interpretation None      MDM   Final diagnoses:  Chronic respiratory failure, unspecified whether with hypoxia or hypercapnia    Patient does not have any respiratory distress and is breathing comfortably in the room. Given an albuterol nebulizer but she states no help. Her main complaint appears to be mostly right-sided pain that appears to be acute on chronic. She had a CT scan and this last few days that showed no pulmonary embolism and I do not feel that repeat testing is necessary. Chest x-ray shows persistent edema that is not worse and to me looks low improved from yesterday. Labwork unremarkable. Mild white blood cell count likely due to the steroid she has been getting in the hospital. I do not see any indication for repeat admission. She is asking to be admitted until Thursday (today is Tuesday) because that is when she gets her next check. However with her overall appearance with no respiratory distress, new hypoxia, or significant vital sign abnormality or do not feel admission is indicated. I discussed this with the patient. Social work and Tourist information centre manager have seen the patient and have helped as best they can. Patient does have an appointment with the [facility] tomorrow morning and I stressed the importance of follow-up with  this. Patient will be discharged    [name], MD 11/21/14 2055

## 2014-11-21 NOTE — Progress Notes (Signed)
Dr Wynelle Cleveland called CN and requested we make an appointment for follow up with pt's PCP Dr York Ram. When I called to make the appointment the office staff said, "We do not do appointments, it's first come first served". I spoke with Ms Keebler and clarified with her to follow up next week with Dr York Ram' office. She also has an appointment with  [facility] and [facility].

## 2014-11-21 NOTE — ED Notes (Signed)
Pt states that she was dismissed from ICU couple hours ago.  Pt c/o of shob and wheezing.  Pt states that she told staff upstairs before she was discharge but they didn't do anything about it.

## 2014-11-21 NOTE — Discharge Instructions (Signed)
Acute Respiratory Failure °Respiratory failure is when your lungs are not working well and your breathing (respiratory) system fails. When respiratory failure occurs, it is difficult for your lungs to get enough oxygen, get rid of carbon dioxide, or both. Respiratory failure can be life threatening.  °Respiratory failure can be acute or chronic. Acute respiratory failure is sudden, severe, and requires emergency medical treatment. Chronic respiratory failure is less severe, happens over time, and requires ongoing treatment.  °WHAT ARE THE CAUSES OF ACUTE RESPIRATORY FAILURE?  °Any problem affecting the heart or lungs can cause acute respiratory failure. Some of these causes include the following: °· Chronic bronchitis and emphysema (COPD).   °· Blood clot going to a lung (pulmonary embolism).   °· Having water in the lungs caused by heart failure, lung injury, or infection (pulmonary edema).   °· Collapsed lung (pneumothorax).   °· Pneumonia.   °· Pulmonary fibrosis.   °· Obesity.   °· Asthma.   °· Heart failure.   °· Any type of trauma to the chest that can make breathing difficult.   °· Nerve or muscle diseases making chest movements difficult. °WHAT SYMPTOMS SHOULD YOU WATCH FOR?  °If you have any of these signs or symptoms, you should seek immediate medical care:  °· You have shortness of breath (dyspnea) with or without activity.   °· You have rapid, fast breathing (tachypnea).   °· You are wheezing. °· You are unable to say more than a few words without having to catch your breath. °· You find it very difficult to function normally. °· You have a fast heart rate.   °· You have a bluish color to your finger or toe nail beds.   °· You have confusion or drowsiness or both.   °HOW WILL MY ACUTE RESPIRATORY FAILURE BE TREATED?  °Treatment of acute respiratory failure depends on the cause of the respiratory failure. Usually, you will stay in the intensive care unit so your breathing can be watched closely. Treatment  can include the following: °· Oxygen. Oxygen can be delivered through the following: °· Nasal cannula. This is small tubing that goes in your nose to give you oxygen. °· Face mask. A face mask covers your nose and mouth to give you oxygen. °· Medicine. Different medicines can be given to help with breathing. These can include: °· Nebulizers. Nebulizers deliver medicines to open the air passages (bronchodilators). These medicines help to open or relax the airways in the lungs so you can breathe better. They can also help loosen mucus from your lungs. °· Diuretics. Diuretic medicines can help you breathe better by getting rid of extra water in your body. °· Steroids. Steroid medicines can help decrease swelling (inflammation) in your lungs. °· Antibiotics. °· Chest tube. If you have a collapsed lung (pneumothorax), a chest tube is placed to help reinflate the lung. °· Non-invasive positive pressure ventilation (NPPV). This is a tight-fitting mask that goes over your nose and mouth. The mask has tubing that is attached to a machine. The machine blows air into the tubing, which helps to keep the tiny air sacs (alveoli) in your lungs open. This machine allows you to breathe on your own. °· Ventilator. A ventilator is a breathing machine. When on a ventilator, a breathing tube is put into the lungs. A ventilator is used when you can no longer breathe well enough on your own. You may have low oxygen levels or high carbon dioxide (CO2) levels in your blood. When you are on a ventilator, sedation and pain medicines are given to make you sleep   so your lungs can heal. °Document Released: 03/15/2013 Document Revised: 07/25/2013 Document Reviewed: 03/15/2013 °ExitCare® Patient Information ©2015 [employer]. This information is not intended to replace advice given to you by your health care provider. Make sure you discuss any questions you have with your health care provider. ° °Chronic Respiratory Failure °Respiratory failure  is when your lungs are not working well and your breathing (respiratory) system fails. When respiratory failure occurs, it is difficult for your lungs to get enough oxygen or get rid of carbon dioxide or both. Respiratory failure can be life threatening.  °Respiratory failure can be acute or chronic. Acute respiratory failure is sudden, severe, and requires emergency medical treatment. Chronic respiratory failure is less severe, happens over time, and requires ongoing treatment.  °CAUSES  °Any problem affecting the heart or lungs can cause respiratory failure. Some of these causes may be: °· Chronic bronchitis and emphysema (COPD). °· Blood clot going to the lung (pulmonary embolism). °· Having water in the lungs caused by heart failure, lung injury, or infection (pulmonary edema). °· Collapsed lung (pneumothorax). °· Pneumonia. °· Pulmonary fibrosis. °· Obesity. °· Asthma. °· Heart failure. °· Any type of trauma to the chest that can make breathing difficult. °· Nerve or muscle diseases making chest movements difficult. °SYMPTOMS  °Signs and symptoms of chronic respiratory failure include: °· Shortness of breath (dyspnea) with or without activity. °· Rapid, fast breathing (tachypnea). °· Wheezing. °· Fast heart rate. °· Bluish color to the fingernail or toenail beds. °· Confusion or drowsiness or both. °DIAGNOSIS  °Initial diagnosis requires a thorough history and a physical exam by your health care provider. Additional tests may include: °· Chest X-ray. °· CT scan of your lungs. °· Ultrasound to check for blood clots. °· Blood tests, such as an arterial blood gas test (ABG). This is a blood test that looks at the oxygen and carbon dioxide levels in your arterial blood. °· Your vital signs will be taken. This includes your respiratory rate (how many times a minute you are breathing), oxygen saturation (this measures the oxygen level in your blood), heart rate, and blood pressure. These numbers help your health care  provider determine the next steps. °· Electrocardiogram. °TREATMENT  °Treatment of chronic respiratory failure depends on the cause of the respiratory failure. Treatment can include the following: °· Oxygen. Oxygen can be delivered through the following: °¨ Nasal cannula. This is small tubing that goes in your nose to give you oxygen. °¨ Face mask. A face mask covers your nose and mouth to give you oxygen. °· Medicine. Different medicines can be given to help with breathing. These can include: °¨ Nebulizers. Nebulizers deliver medicines to open the air passages (bronchodilators). These medicines help to open or relax the airways in the lungs so you can breathe better. They can also help loosen mucus from your lungs. °¨ Diuretics. Diuretic medicines can help you breathe better by getting rid of extra fluid in your body. °¨ Steroids. Steroid medicines can help decrease inflammation in your lungs. °· Chest tube. If you have a collapsed lung (pneumothorax), a chest tube is placed to help reinflate the lung. °· Non-invasive positive pressure ventilation (NPPV). This is a tight-fitting mask that goes over your nose and mouth. The mask has tubing that is attached to a machine. The machine blows air into the tubing, which helps to keep the tiny air sacs (alveoli) in your lungs open. This machine allows you to breathe on your own. °· Ventilator. A ventilator   is a breathing machine. When on a ventilator, a breathing tube is put into the lungs. A ventilator is used when you can no longer breathe well enough on your own. You may have low oxygen levels or high carbon dioxide (CO2) levels in your blood. When you are on a ventilator, sedation and pain medicines are given to make you sleep so your lungs can heal. °HOME CARE INSTRUCTIONS °· Follow your health care provider's directions about medicines and respiratory therapy. °· Quit smoking if you smoke. °SEEK MEDICAL CARE IF: °· You have increasing shortness of breath and are less  functional than you have been. °· You have increased sputum, wheezing, coughing, or loss of energy. °· You are on oxygen and are requiring more. °SEEK IMMEDIATE MEDICAL CARE IF: °· Your shortness of breath is significantly worse. °· You are unable to say more than a few words without having to catch your breath. °· You are much less functional. °MAKE SURE YOU: °· Understand these instructions. °· Will watch your condition. °· Will get help right away if you are not doing well or get worse. °Document Released: 03/10/2005 Document Revised: 07/25/2013 Document Reviewed: 01/06/2013 °ExitCare® Patient Information ©2015 [employer]. This information is not intended to replace advice given to you by your health care provider. Make sure you discuss any questions you have with your health care provider. ° °

## 2014-11-21 NOTE — Progress Notes (Signed)
CRITICAL VALUE ALERT  Critical value received: Pco2 62.4  Date of notification:  11/21/14  Time of notification:  [id number]  Critical value read back:Yes.    Nurse who received alert:  Philis Fendt  MD notified (1st page):  Dr Wynelle Cleveland  Time of first page:  35  MD notified (2nd page):  Time of second page:  Responding MD:    Time MD responded:

## 2014-11-21 NOTE — ED Notes (Signed)
Visitors of other patients have been approached by this pt while in the lobby, explaining how she has no money and she has been having troubles with her breathing and the doctors and staff here or doing nothing to help her out.  Visitor offered pt resources for places to stay but pt was informing her the she didn't make that much money.

## 2014-11-21 NOTE — Hospital Discharge Follow-Up (Signed)
Transitional Care Clinic Care Coordination Note:  Admit date:  11/15/2014 Discharge date: 11/21/2014  Discharge Disposition: homeless - She stated that she is going to the [facility] ) after discharge as she will not be allowed back to the [employer].  Patient contact: # [phone] or [phone] ( cell #s)  Emergency contact(s): She said that she does not have any emergency /family contacts.   This Case Manager reviewed patient's EMR and determined patient would benefit from post-discharge medical management and chronic care management services through the [facility]. Patient has a history of asthma, morbid obesity, venous stasis,  CKD - 3, right sided heart failure, OSA - has been on CPAP but stated that she does not have it any longer. She was admitted with acute on chronic respiratory failure and has 1 hospital admission and 4 ED visits in the past 6 months. This Case Manager met with patient to discuss the services and medical management that can be provided at the [facility] Patient verbalized understanding and agreed to receive post-discharge care at the [facility].   Patient scheduled for Transitional Care appointment on 11/23/14 @ 0945 w/ Dr Jarold Song.  Clinic information and appointment time provided to patient. Appointment information also placed on AVS.  Assessment:       Home Environment:  She is currently homeless. She has refused placement and ALF.  She stated that she does not want to loose her check.  She wants to go back to the [facility] as she had been staying there in the lobby prior to her hospitalization; but there are no beds available.  She said that she plans to go to the [facility] when she is discharged. She has stated that after she receives her check she will consider staying at a motel.        Support System: no support system at this time       Level of functioning: independent with w/c mobility       Home DME:  motorized wheelchair.  She said that she does not have any other DME.        Home care services: (services arranged prior to discharge or new services after discharge) - No home care services will be ordered at this time.        Transportation:  [employer] bus- she will be provided with bus passes at discharge. She also said that she is registered with [facility] for transportation and has used them in the past.         Food/Nutrition: (ability to afford, access, use of any community resources) She reported that her income is $733/month and she is not receiving food stamps at this time.  She said that she needs to re-apply for food stamps.  She noted that she has her meals at [facility] and reported no problems accessing food.         Medications: (ability to afford, access, compliance, Pharmacy used) She said that she has all of her medications with her and had them filled at a [facility] in the past. She said She said that she has not had a nebulizer        Identified Barriers: homeless, lack of support system, no food stamps and possible difficulty accessing meals if she is not at the [facility], multiple co-morbidities, non compliance with CPAP, resistance to assisted living facility placement.         PCP (Name, office location, phone  number): Patient stated that she has seen Dr [name] on [place].   Patient Education: Explained the Transitional Care Program and the services at the [facility] - including pharmacy assistance, social work and financial counseling.  Explained the policy for pain medication at the [facility] and she said that she would continue to see Dr [name] for her pain medication if needed. Instructed regarding the importance of close medical follow up after discharge.             Arranged services:        Services communicated to Velva Harman, CM

## 2014-11-21 NOTE — Progress Notes (Signed)
CSW met with patient at bedside. Patient confirms that she is homeless, and states that prior to coming to [facility] she has been staying at [facility] in their overflow unit.  CSW reached out to [facility] to inquire if they have any beds for the night. [facility] states that the patient is welcomed to come back to their facility tonight. CSW made patient aware. CSW gave patient resources for other homeless shelters and food pantries.   Patient states she is not interested in a ALF because it would taker her check. Patient states that she will return back to the shelter tonight.  Willette Brace [phone] ED CSW 11/21/2014 8:58 PM

## 2014-11-22 ENCOUNTER — Emergency Department (HOSPITAL_COMMUNITY): Payer: Medicaid Other

## 2014-11-22 ENCOUNTER — Telehealth: Payer: Self-pay

## 2014-11-22 ENCOUNTER — Emergency Department (HOSPITAL_COMMUNITY)
Admission: EM | Admit: 2014-11-22 | Discharge: 2014-11-22 | Disposition: A | Discharge status: Home / Self Care | Payer: Medicaid Other | Source: Home / Self Care | Attending: Emergency Medicine | Admitting: Emergency Medicine

## 2014-11-22 ENCOUNTER — Encounter (HOSPITAL_COMMUNITY): Payer: Self-pay

## 2014-11-22 ENCOUNTER — Encounter (HOSPITAL_COMMUNITY): Payer: Self-pay | Admitting: Emergency Medicine

## 2014-11-22 ENCOUNTER — Emergency Department (HOSPITAL_COMMUNITY)
Admission: EM | Admit: 2014-11-22 | Discharge: 2014-11-23 | Disposition: A | Discharge status: Home / Self Care | Payer: Medicaid Other | Attending: Emergency Medicine | Admitting: Emergency Medicine

## 2014-11-22 DIAGNOSIS — I509 Heart failure, unspecified: Secondary | ICD-10-CM | POA: Insufficient documentation

## 2014-11-22 DIAGNOSIS — Z9981 Dependence on supplemental oxygen: Secondary | ICD-10-CM | POA: Insufficient documentation

## 2014-11-22 DIAGNOSIS — Z87891 Personal history of nicotine dependence: Secondary | ICD-10-CM

## 2014-11-22 DIAGNOSIS — Z872 Personal history of diseases of the skin and subcutaneous tissue: Secondary | ICD-10-CM | POA: Insufficient documentation

## 2014-11-22 DIAGNOSIS — Z79899 Other long term (current) drug therapy: Secondary | ICD-10-CM | POA: Insufficient documentation

## 2014-11-22 DIAGNOSIS — G8929 Other chronic pain: Secondary | ICD-10-CM

## 2014-11-22 DIAGNOSIS — J45901 Unspecified asthma with (acute) exacerbation: Secondary | ICD-10-CM

## 2014-11-22 DIAGNOSIS — Z5900 Homelessness unspecified: Secondary | ICD-10-CM

## 2014-11-22 DIAGNOSIS — M7989 Other specified soft tissue disorders: Secondary | ICD-10-CM | POA: Insufficient documentation

## 2014-11-22 DIAGNOSIS — Z8614 Personal history of Methicillin resistant Staphylococcus aureus infection: Secondary | ICD-10-CM | POA: Insufficient documentation

## 2014-11-22 DIAGNOSIS — Z7982 Long term (current) use of aspirin: Secondary | ICD-10-CM

## 2014-11-22 DIAGNOSIS — M109 Gout, unspecified: Secondary | ICD-10-CM

## 2014-11-22 DIAGNOSIS — M1990 Unspecified osteoarthritis, unspecified site: Secondary | ICD-10-CM

## 2014-11-22 DIAGNOSIS — I10 Essential (primary) hypertension: Secondary | ICD-10-CM

## 2014-11-22 DIAGNOSIS — R0602 Shortness of breath: Secondary | ICD-10-CM | POA: Diagnosis present

## 2014-11-22 DIAGNOSIS — G4733 Obstructive sleep apnea (adult) (pediatric): Secondary | ICD-10-CM | POA: Insufficient documentation

## 2014-11-22 DIAGNOSIS — K219 Gastro-esophageal reflux disease without esophagitis: Secondary | ICD-10-CM

## 2014-11-22 DIAGNOSIS — E785 Hyperlipidemia, unspecified: Secondary | ICD-10-CM | POA: Insufficient documentation

## 2014-11-22 DIAGNOSIS — Q828 Other specified congenital malformations of skin: Secondary | ICD-10-CM | POA: Insufficient documentation

## 2014-11-22 DIAGNOSIS — R0600 Dyspnea, unspecified: Secondary | ICD-10-CM

## 2014-11-22 DIAGNOSIS — Z880 Allergy status to penicillin: Secondary | ICD-10-CM | POA: Diagnosis not present

## 2014-11-22 DIAGNOSIS — Z87448 Personal history of other diseases of urinary system: Secondary | ICD-10-CM | POA: Diagnosis not present

## 2014-11-22 LAB — BASIC METABOLIC PANEL
ANION GAP: 11 (ref 5–15)
BUN: 22 mg/dL — ABNORMAL HIGH (ref 6–20)
CO2: 32 mmol/L (ref 22–32)
Calcium: 8.8 mg/dL — ABNORMAL LOW (ref 8.9–10.3)
Chloride: 99 mmol/L — ABNORMAL LOW (ref 101–111)
Creatinine, Ser: 1.45 mg/dL — ABNORMAL HIGH (ref 0.44–1.00)
GFR calc Af Amer: 48 mL/min — ABNORMAL LOW (ref >60)
GFR calc non Af Amer: 41 mL/min — ABNORMAL LOW (ref >60)
GLUCOSE: 114 mg/dL — AB (ref 65–99)
POTASSIUM: 4 mmol/L (ref 3.5–5.1)
Sodium: 142 mmol/L (ref 135–145)

## 2014-11-22 LAB — CBC
HEMATOCRIT: 43 % (ref 36.0–46.0)
HEMOGLOBIN: 12.9 g/dL (ref 12.0–15.0)
MCH: 28.4 pg (ref 26.0–34.0)
MCHC: 30 g/dL (ref 30.0–36.0)
MCV: 94.5 fL (ref 78.0–100.0)
Platelets: 311 10*3/uL (ref 150–400)
RBC: 4.55 MIL/uL (ref 3.87–5.11)
RDW: 15.2 % (ref 11.5–15.5)
WBC: 14.3 10*3/uL — ABNORMAL HIGH (ref 4.0–10.5)

## 2014-11-22 LAB — I-STAT TROPONIN, ED: Troponin i, poc: 0 ng/mL (ref 0.00–0.08)

## 2014-11-22 NOTE — ED Notes (Signed)
Pt here for SOB today and pain in her right lung. Feels like her feet and legs and hands are swollen. Pt is a CHF patient and is in her own powered wheelchair.

## 2014-11-22 NOTE — ED Notes (Addendum)
Pts 02 sat 89% RA prior to room arrival. Placed on 3L [place]. 96% 02.

## 2014-11-22 NOTE — ED Provider Notes (Signed)
CSN: [id number]   Arrival date & time 11/22/14 0009  History  This chart was scribed for Jola Schmidt, MD by Altamease Oiler, ED Scribe. This patient was seen in room WTR1/WLPT1 and the patient's care was started at 12:54 AM.  Chief Complaint  Patient presents with  . Shortness of Breath    HPI The history is provided by the patient. No language interpreter was used.   Kara Vincent is a 49 y.o. female who with PMHx of asthma, CHF, oxygen dependency, and chronic pain presents to the Emergency Department complaining of diffuse right sided pain and ongoing SOB that worsened today. She also complains of chest pain stating that it hurts when she breathes or coughs. Her albuterol inhaler (last dose around 11 PM) and the breathing treatment that she received in the ED earlier tonight provided insufficient relief. Pt was discharged from the hospital today and has been seen in the ED twice since then. She states that she has nowhere to go. Pt wants to be admitted back into the hospital until 11/23/14 until she will have money to seek shelter.   Past Medical History  Diagnosis Date  . Asthma   . GERD (gastroesophageal reflux disease)   . Hypertension   . Homelessness   . Low back pain   . 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 Arthritis   . Gout   . Hyperlipemia   . Morbid obesity     uses motor wheel chair  . MRSA (methicillin resistant Staphylococcus aureus)     states about a year ago  . CHF (congestive heart failure)   . Chronic abdominal pain   . Hidradenitis   . Chronic pain     "all over"  . Renal insufficiency   . On home O2     3L N/C O2 continuously  . OSA (obstructive sleep apnea)     non-compliant with CPAP    Past Surgical History  Procedure Laterality Date  . Tubal ligation    . Cesarean section      Family History  Problem Relation Age of Onset  . Asthma Mother   . Diabetes Father   . Heart disease Father   . Stroke Father     Social History   Substance Use Topics  . Smoking status: Former Smoker    Types: Cigarettes    Quit date: 03/25/2003  . Smokeless tobacco: Former Systems developer    Quit date: 05/27/1978  . Alcohol Use: No     Review of Systems 10 Systems reviewed and all are negative for acute change except as noted in the HPI. Home Medications   Prior to Admission medications   Medication Sig Start Date End Date Taking? Authorizing Provider  albuterol-ipratropium (COMBIVENT) 18-103 MCG/ACT inhaler Inhale 2 puffs into the lungs every 4 (four) hours as needed for wheezing or shortness of breath. 11/21/14   Debbe Odea, MD  allopurinol (ZYLOPRIM) 100 MG tablet Take 1 tablet (100 mg total) by mouth every morning. 01/22/14   Donne Hazel, MD  ALPRAZolam Duanne Moron) 1 MG tablet Take 1 mg by mouth at bedtime as needed for anxiety (anxiety).    Historical Provider, MD  ammonium lactate (AMLACTIN) 12 % cream Apply 1 g topically daily as needed for dry skin. 04/22/14   Linton Flemings, MD  aspirin 325 MG tablet Take 325 mg by mouth every morning.     Historical Provider, MD  atorvastatin (LIPITOR) 20 MG tablet Take  1 tablet (20 mg total) by mouth every morning. 01/22/14   Donne Hazel, MD  budesonide-formoterol [facility]) 160-4.5 MCG/ACT inhaler Inhale 2 puffs into the lungs 2 (two) times daily. Patient not taking: Reported on 11/21/2014 11/21/14   Debbe Odea, MD  clotrimazole (LOTRIMIN) 1 % cream Apply to affected area 2 times daily Patient taking differently: Apply 1 application topically 2 (two) times daily.  06/20/14   Charlann Lange, PA-C  diphenhydrAMINE (BENADRYL) 25 mg capsule Take 25 mg by mouth every 6 (six) hours as needed for itching or allergies (allergies).     Historical Provider, MD  Emollient (EUCERIN) lotion Apply 5 mLs topically 2 (two) times daily as needed for dry skin. Patient taking differently: Apply 5 mLs topically 3 (three) times daily.  04/22/14   Linton Flemings, MD  folic acid (FOLVITE) 1 MG tablet Take 1 tablet (1 mg total)  by mouth daily. 01/22/14   Donne Hazel, MD  furosemide (LASIX) 40 MG tablet Take 1 tablet (40 mg total) by mouth 2 (two) times daily. 01/22/14   Donne Hazel, MD  guaiFENesin (MUCINEX) 600 MG 12 hr tablet Take 1 tablet (600 mg total) by mouth 2 (two) times daily. 11/21/14   Debbe Odea, MD  hydrOXYzine (ATARAX/VISTARIL) 25 MG tablet Take 25 mg by mouth 3 (three) times daily as needed for anxiety or itching (anxiety and itching).     Historical Provider, MD  ketoconazole (NIZORAL) 2 % shampoo Apply 1 application topically daily as needed for irritation (irritation).     Historical Provider, MD  ketoconazole (NIZORAL) 200 MG tablet Take 200 mg by mouth every morning.     Historical Provider, MD  lisinopril (PRINIVIL,ZESTRIL) 40 MG tablet Take 40 mg by mouth daily.    Historical Provider, MD  Multiple Vitamin (MULTIVITAMIN WITH MINERALS) TABS tablet Take 1 tablet by mouth daily. 01/22/14   Donne Hazel, MD  pantoprazole (PROTONIX) 40 MG tablet Take 1 tablet (40 mg total) by mouth every morning. 01/22/14   Donne Hazel, MD  predniSONE (DELTASONE) 20 MG tablet Take 1 tablet (20 mg total) by mouth daily with breakfast. 11/21/14   Debbe Odea, MD  solifenacin (VESICARE) 5 MG tablet Take 5 mg by mouth every morning.     Historical Provider, MD  vitamin B-12 (CYANOCOBALAMIN) 1000 MCG tablet Take 1 tablet (1,000 mcg total) by mouth every morning. 01/22/14   Donne Hazel, MD  vitamin E 100 UNIT capsule Take 1 capsule (100 Units total) by mouth every morning. 01/22/14   Donne Hazel, MD    Allergies  Ibuprofen; Adhesive; Azithromycin; Doxycycline; Penicillins; Sulfa antibiotics; and Sulfamethoxazole-trimethoprim  Triage Vitals: BP 151/81 mmHg  Pulse 87  Temp(Src) 97.8 F (36.6 C) (Oral)  Resp 17  SpO2 90%  LMP 03/24/2009  Physical Exam  Constitutional: She is oriented to person, place, and time. She appears well-developed and well-nourished. No distress.  HENT:  Head: Normocephalic and  atraumatic.  Eyes: EOM are normal.  Neck: Normal range of motion.  Cardiovascular: Normal rate, regular rhythm and normal heart sounds.   Pulmonary/Chest: Effort normal and breath sounds normal.  Abdominal: Soft. She exhibits no distension. There is no tenderness.  Musculoskeletal: Normal range of motion.  Neurological: She is alert and oriented to person, place, and time.  Skin: Skin is warm and dry.  Psychiatric: She has a normal mood and affect. Judgment normal.  Nursing note and vitals reviewed.   ED Course  Procedures   DIAGNOSTIC STUDIES:  Oxygen Saturation is 90% on RA, Adequate by my interpretation.    COORDINATION OF CARE: 12:58 AM Discussed treatment plan which includes discharge with pt at bedside and pt agreed to plan.  Labs Reviewed - No data to display  I, Jola Schmidt, MD, personally reviewed and evaluated these images and lab results as part of my medical decision-making.  Imaging Review Dg Chest 2 View  11/21/2014   CLINICAL DATA:  Right chest pain and dyspnea.  EXAM: CHEST  2 VIEW  COMPARISON:  November 19, 2014  FINDINGS: The heart size and mediastinal contours are stable. The heart size is enlarged. There is increased pulmonary interstitium bilaterally. There is no focal pneumonia or pleural effusion. The visualized skeletal structures are stable.  IMPRESSION: Persistent congestive heart failure.   Electronically Signed   By: Abelardo Diesel M.D.   On: 11/21/2014 18:33  I personally reviewed the imaging tests through PACS system I reviewed available ER/hospitalization records through the EMR        MDM   Final diagnoses:  Homeless  Dyspnea     Patient was just in emergency department several hours ago.  She's been sitting in the waiting room and decided to check back in.  She is homeless.  She has no vertigo.  I think this is primarily what is resulting in her presentation to the ER at this time.  Her workup several hours ago included laboratory studies  and chest x-ray which was without significant change from her priors.  She was just discharged from the hospital earlier today.  Medical screening examination completed.  No life-threatening emergency.  O2 sats 90%.  Not tachycardic or hypotensive.  Afebrile.  Primary care follow-up.  Patient started been seen and evaluated twice today by the social worker and given outpatient resources for homeless shelters.    I personally performed the services described in this documentation, which was scribed in my presence. The recorded information has been reviewed and is accurate.    Jola Schmidt, MD 11/22/14 [phone]

## 2014-11-22 NOTE — Telephone Encounter (Signed)
[facility] Post-discharge Follow-Up Phone Call:  Date of Discharge: 11/21/14 Principal Discharge Diagnosis(es): Acute on chronic respiratory failure with hypoxia Post-discharge Communication: Attempt #2  to reach patient at [phone] and [phone]. Unable to reach patient once again at either number. Voicemails left x2 requesting return call. Awaiting return call from patient.

## 2014-11-22 NOTE — ED Notes (Signed)
Pt states she is feeling short of breath  Pt states she has been feeling like this for 3 to 4 days  Pt states she has pain in her right side rib area  Rates pain 10/10

## 2014-11-22 NOTE — Telephone Encounter (Signed)
Transitional Care Clinic Post-discharge Follow-Up Phone Call:  Date of Discharge: 11/21/14 Principal Discharge Diagnosis(es): Acute on chronic respiratory failure with hypoxia Post-discharge Communication: Attempted to reach patient at [phone] and [phone]. Unable to reach patient at either number. Voicemails left x2 requesting return call.  Also placed call to [facility] as Social Worker note from ED stated patient would have a bed at [facility] on 11/21/14. Spoke with Legrand Como, Librarian, academic, at the [facility], who indicated patient did not come to [facility] last night, no bed available on 11/22/14, and no bed projected in the future. He indicated patient really needs placement at an [place] due to mobility issues, complex medical issues. Awaiting return call from patient. Patient declined ALF placement when hospitalized. Call Completed: No

## 2014-11-22 NOTE — ED Notes (Signed)
Pt states she is suppose to be on 3 liters of oxygen but it has been in storage for the past month.

## 2014-11-23 ENCOUNTER — Telehealth: Payer: Self-pay

## 2014-11-23 ENCOUNTER — Inpatient Hospital Stay: Payer: Medicaid Other | Admitting: Family Medicine

## 2014-11-23 MED ORDER — ALBUTEROL SULFATE HFA 108 (90 BASE) MCG/ACT IN AERS: 2.0000 | INHALATION_SPRAY | RESPIRATORY_TRACT | Status: DC

## 2014-11-23 NOTE — Telephone Encounter (Signed)
Transitional Care Clinic Post-discharge Follow-Up Phone Call:  Date of Discharge: 11/21/14 Principal Discharge Diagnosis(es): Acute on chronic respiratory failure with hypoxia. Post-discharge Communication: Attempt #3 to reach patient at [phone]. Unable to reach patient; voicemail left requesting return call.  Call placed to patient to complete discharge follow-up phone call and to discuss rescheduling [facility] appointment as patient did not show up for her appointment on 11/23/14.  Awaiting return call from patient.  Call Completed: No

## 2014-11-23 NOTE — ED Provider Notes (Signed)
CSN: [id number]     Arrival date & time 11/22/14  2245 History  This chart was scribed for Jola Schmidt, MD by Meriel Pica, ED Scribe. This patient was seen in room A01C/A01C and the patient's care was started 1:14 AM.    Chief Complaint  Patient presents with  . Shortness of Breath   The history is provided by the patient. No language interpreter was used.   HPI Comments: Kara Vincent is a 49 y.o. female, with a PMhx of asthma, CHF, and O2 dependency, who presents to the Emergency Department complaining of an exacerbation of her chronic SOB. Pt reports associated wheezing and left leg swelling. The pt was recently hospitalized and discharged yesterday. She presented to the ED 3 times yesterday after discharge for SOB. She had an unremarkable chest Xray yesterday on her ED visit.  The pt is on 3L home O2 which she reports is currently in her storage unit. Pt was given an albuterol inhaler on yesterday's ED visit but did not recall using it prior to presenting to the ED this morning. Pt is homeless and states she has nowhere to go. Pt is in a motorized wheelchair.   Past Medical History  Diagnosis Date  . Asthma   . GERD (gastroesophageal reflux disease)   . Hypertension   . Homelessness   . Low back pain   . 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 Arthritis   . Gout   . Hyperlipemia   . Morbid obesity     uses motor wheel chair  . MRSA (methicillin resistant Staphylococcus aureus)     states about a year ago  . CHF (congestive heart failure)   . Chronic abdominal pain   . Hidradenitis   . Chronic pain     "all over"  . Renal insufficiency   . On home O2     3L N/C O2 continuously  . OSA (obstructive sleep apnea)     non-compliant with CPAP   Past Surgical History  Procedure Laterality Date  . Tubal ligation    . Cesarean section     Family History  Problem Relation Age of Onset  . Asthma Mother   . Diabetes Father   . Heart disease Father   .  Stroke Father    Social History  Substance Use Topics  . Smoking status: Former Smoker    Types: Cigarettes    Quit date: 03/25/2003  . Smokeless tobacco: Former Systems developer    Quit date: 05/27/1978  . Alcohol Use: No   OB History    No data available     Review of Systems  Respiratory: Positive for shortness of breath and wheezing.   Cardiovascular: Positive for leg swelling.  All other systems reviewed and are negative.  Allergies  Ibuprofen; Adhesive; Azithromycin; Doxycycline; Penicillins; Sulfa antibiotics; and Sulfamethoxazole-trimethoprim  Home Medications   Prior to Admission medications   Medication Sig Start Date End Date Taking? Authorizing Provider  allopurinol (ZYLOPRIM) 100 MG tablet Take 1 tablet (100 mg total) by mouth every morning. 01/22/14  Yes Donne Hazel, MD  ALPRAZolam Duanne Moron) 1 MG tablet Take 1 mg by mouth at bedtime as needed for anxiety (anxiety).   Yes Historical Provider, MD  ammonium lactate (AMLACTIN) 12 % cream Apply 1 g topically daily as needed for dry skin. 04/22/14  Yes Linton Flemings, MD  aspirin 325 MG tablet Take 325 mg by mouth every morning.    Yes  Historical Provider, MD  atorvastatin (LIPITOR) 20 MG tablet Take 1 tablet (20 mg total) by mouth every morning. 01/22/14  Yes Donne Hazel, MD  clotrimazole (LOTRIMIN) 1 % cream Apply to affected area 2 times daily Patient taking differently: Apply 1 application topically 2 (two) times daily.  06/20/14  Yes Charlann Lange, PA-C  diphenhydrAMINE (BENADRYL) 25 mg capsule Take 25 mg by mouth every 6 (six) hours as needed for itching or allergies (allergies).    Yes Historical Provider, MD  Emollient (EUCERIN) lotion Apply 5 mLs topically 2 (two) times daily as needed for dry skin. Patient taking differently: Apply 5 mLs topically 3 (three) times daily.  04/22/14  Yes Linton Flemings, MD  folic acid (FOLVITE) 1 MG tablet Take 1 tablet (1 mg total) by mouth daily. 01/22/14  Yes Donne Hazel, MD  furosemide (LASIX)  40 MG tablet Take 1 tablet (40 mg total) by mouth 2 (two) times daily. 01/22/14  Yes Donne Hazel, MD  hydrOXYzine (ATARAX/VISTARIL) 25 MG tablet Take 25 mg by mouth 3 (three) times daily as needed for anxiety or itching (anxiety and itching).    Yes Historical Provider, MD  ketoconazole (NIZORAL) 2 % shampoo Apply 1 application topically daily as needed for irritation (irritation).    Yes Historical Provider, MD  ketoconazole (NIZORAL) 200 MG tablet Take 200 mg by mouth every morning.    Yes Historical Provider, MD  lisinopril (PRINIVIL,ZESTRIL) 40 MG tablet Take 40 mg by mouth daily.   Yes Historical Provider, MD  Multiple Vitamin (MULTIVITAMIN WITH MINERALS) TABS tablet Take 1 tablet by mouth daily. 01/22/14  Yes Donne Hazel, MD  pantoprazole (PROTONIX) 40 MG tablet Take 1 tablet (40 mg total) by mouth every morning. 01/22/14  Yes Donne Hazel, MD  solifenacin (VESICARE) 5 MG tablet Take 5 mg by mouth every morning.    Yes Historical Provider, MD  vitamin B-12 (CYANOCOBALAMIN) 1000 MCG tablet Take 1 tablet (1,000 mcg total) by mouth every morning. 01/22/14  Yes Donne Hazel, MD  vitamin E 100 UNIT capsule Take 1 capsule (100 Units total) by mouth every morning. 01/22/14  Yes Donne Hazel, MD  albuterol-ipratropium (COMBIVENT) [phone] MCG/ACT inhaler Inhale 2 puffs into the lungs every 4 (four) hours as needed for wheezing or shortness of breath. 11/21/14   Debbe Odea, MD  budesonide-formoterol (SYMBICORT) 160-4.5 MCG/ACT inhaler Inhale 2 puffs into the lungs 2 (two) times daily. Patient not taking: Reported on 11/21/2014 11/21/14   Debbe Odea, MD  guaiFENesin (MUCINEX) 600 MG 12 hr tablet Take 1 tablet (600 mg total) by mouth 2 (two) times daily. 11/21/14   Debbe Odea, MD  predniSONE (DELTASONE) 20 MG tablet Take 1 tablet (20 mg total) by mouth daily with breakfast. 11/21/14   Debbe Odea, MD   BP 144/119 mmHg  Pulse 93  Temp(Src) 98 F (36.7 C) (Oral)  Resp 24  Ht 5\' 5"  (1.651 m)  Wt  398 lb 2 oz (180.588 kg)  BMI 66.25 kg/m2  SpO2 93%  LMP 03/24/2009 Physical Exam  Constitutional: She is oriented to person, place, and time. She appears well-developed and well-nourished. No distress.  HENT:  Head: Normocephalic and atraumatic.  Eyes: EOM are normal.  Neck: Normal range of motion.  Cardiovascular: Normal rate, regular rhythm and normal heart sounds.   Normal DP/TP pulses bilaterally.  Pulmonary/Chest: Effort normal. She has wheezes.  Mild wheezing bilaterally.   Abdominal: Soft. She exhibits no distension. There is no tenderness.  Musculoskeletal: Normal range of motion.  Chronic irritation of medial thighs bilaterally; no obvious erythema or skin breakdown  Neurological: She is alert and oriented to person, place, and time.  Skin: Skin is warm and dry.  Psychiatric: She has a normal mood and affect. Judgment normal.  Nursing note and vitals reviewed.   ED Course  Procedures  DIAGNOSTIC STUDIES: Oxygen Saturation is 93% on 3L [place], low by my interpretation.    COORDINATION OF CARE: 1:21 AM Discussed treatment plan with pt. Will order albuterol breathing tx. Discussed unremarkable labs and imaging with pt. Pt acknowledges and agrees to plan.   Labs Review Labs Reviewed  BASIC METABOLIC PANEL - Abnormal; Notable for the following:    Chloride 99 (*)    Glucose, Bld 114 (*)    BUN 22 (*)    Creatinine, Ser 1.45 (*)    Calcium 8.8 (*)    GFR calc non Af Amer 41 (*)    GFR calc Af Amer 48 (*)    All other components within normal limits  CBC - Abnormal; Notable for the following:    WBC 14.3 (*)    All other components within normal limits  I-STAT TROPOININ, ED    Imaging Review Dg Chest 2 View  11/22/2014   CLINICAL DATA:  Chest pain, productive cough  EXAM: CHEST  2 VIEW  COMPARISON:  11/21/2014  FINDINGS: Moderate enlargement of the cardiac silhouette reidentified with central vascular congestion. No overt focal pulmonary opacity. No pleural effusion.  No acute osseous abnormality.  IMPRESSION: Cardiomegaly with central vascular congestion.   Electronically Signed   By: Conchita Paris M.D.   On: 11/22/2014 23:34   Dg Chest 2 View  11/21/2014   CLINICAL DATA:  Right chest pain and dyspnea.  EXAM: CHEST  2 VIEW  COMPARISON:  November 19, 2014  FINDINGS: The heart size and mediastinal contours are stable. The heart size is enlarged. There is increased pulmonary interstitium bilaterally. There is no focal pneumonia or pleural effusion. The visualized skeletal structures are stable.  IMPRESSION: Persistent congestive heart failure.   Electronically Signed   By: Abelardo Diesel M.D.   On: 11/21/2014 18:33   I have personally reviewed and evaluated these images and lab results as part of my medical decision-making.   EKG Interpretation None      MDM   Final diagnoses:  None   Patient is overall well-appearing.  She is homeless and this is a recurrent visit to the emergency department.  She was seen and are other hospital 3 times yesterday for similar complaints.  Her O2 sats are 92% on room air.  Chest x-ray is unchanged.  Labs are without significant change.  Patient be discharged home in good condition.  She is argument with the social workers twice and has plenty of outpatient resources for shelters.  I personally performed the services described in this documentation, which was scribed in my presence. The recorded information has been reviewed and is accurate.       Jola Schmidt, MD 11/23/14 [phone]

## 2014-11-26 IMAGING — CR DG CHEST 2V
2 series · 2 of 2 positions shown · non-contrast
Comparison: Chest radiograph performed 03/22/2012

CLINICAL DATA: Shortness of breath and weakness; history of asthma.

CHEST - 2 VIEW

[w chest pa]
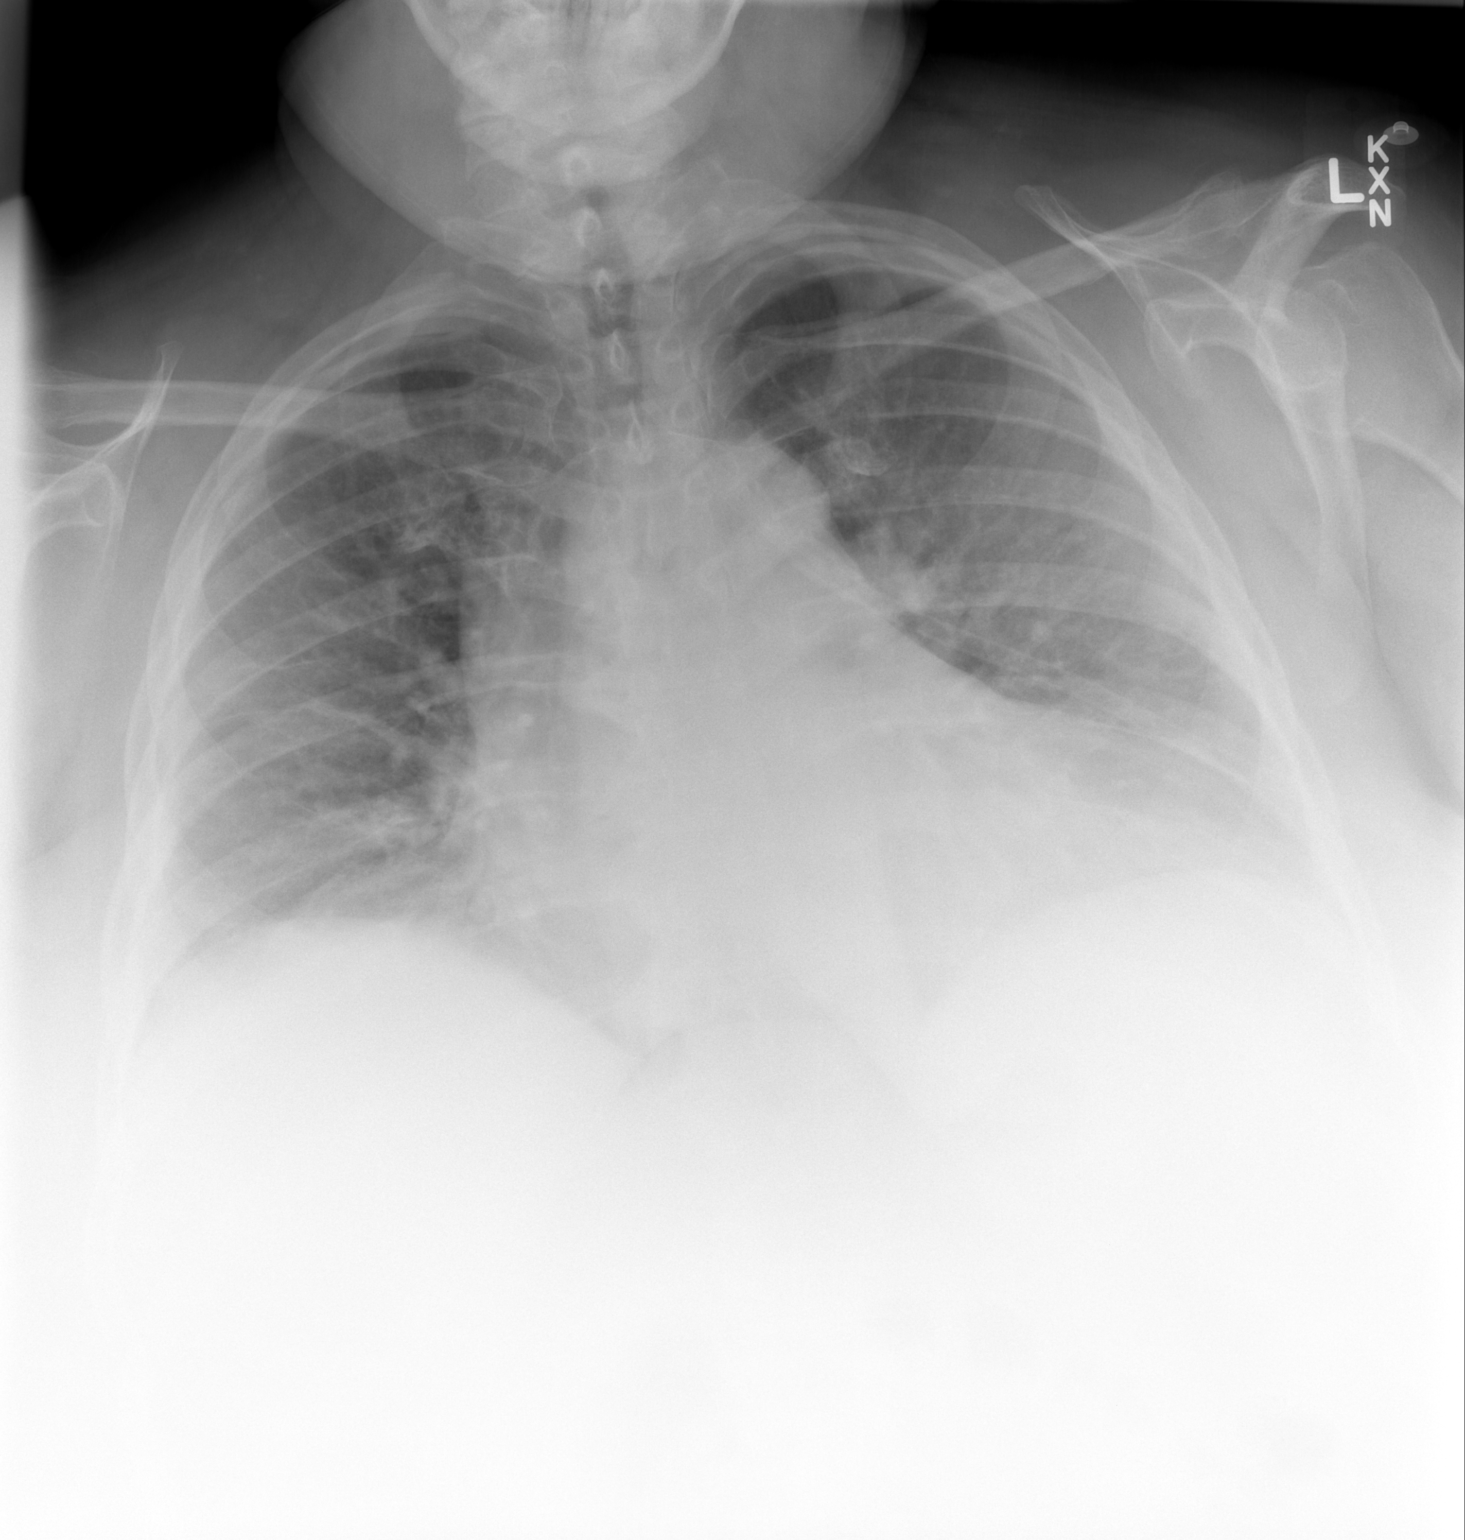

[w chest lat]
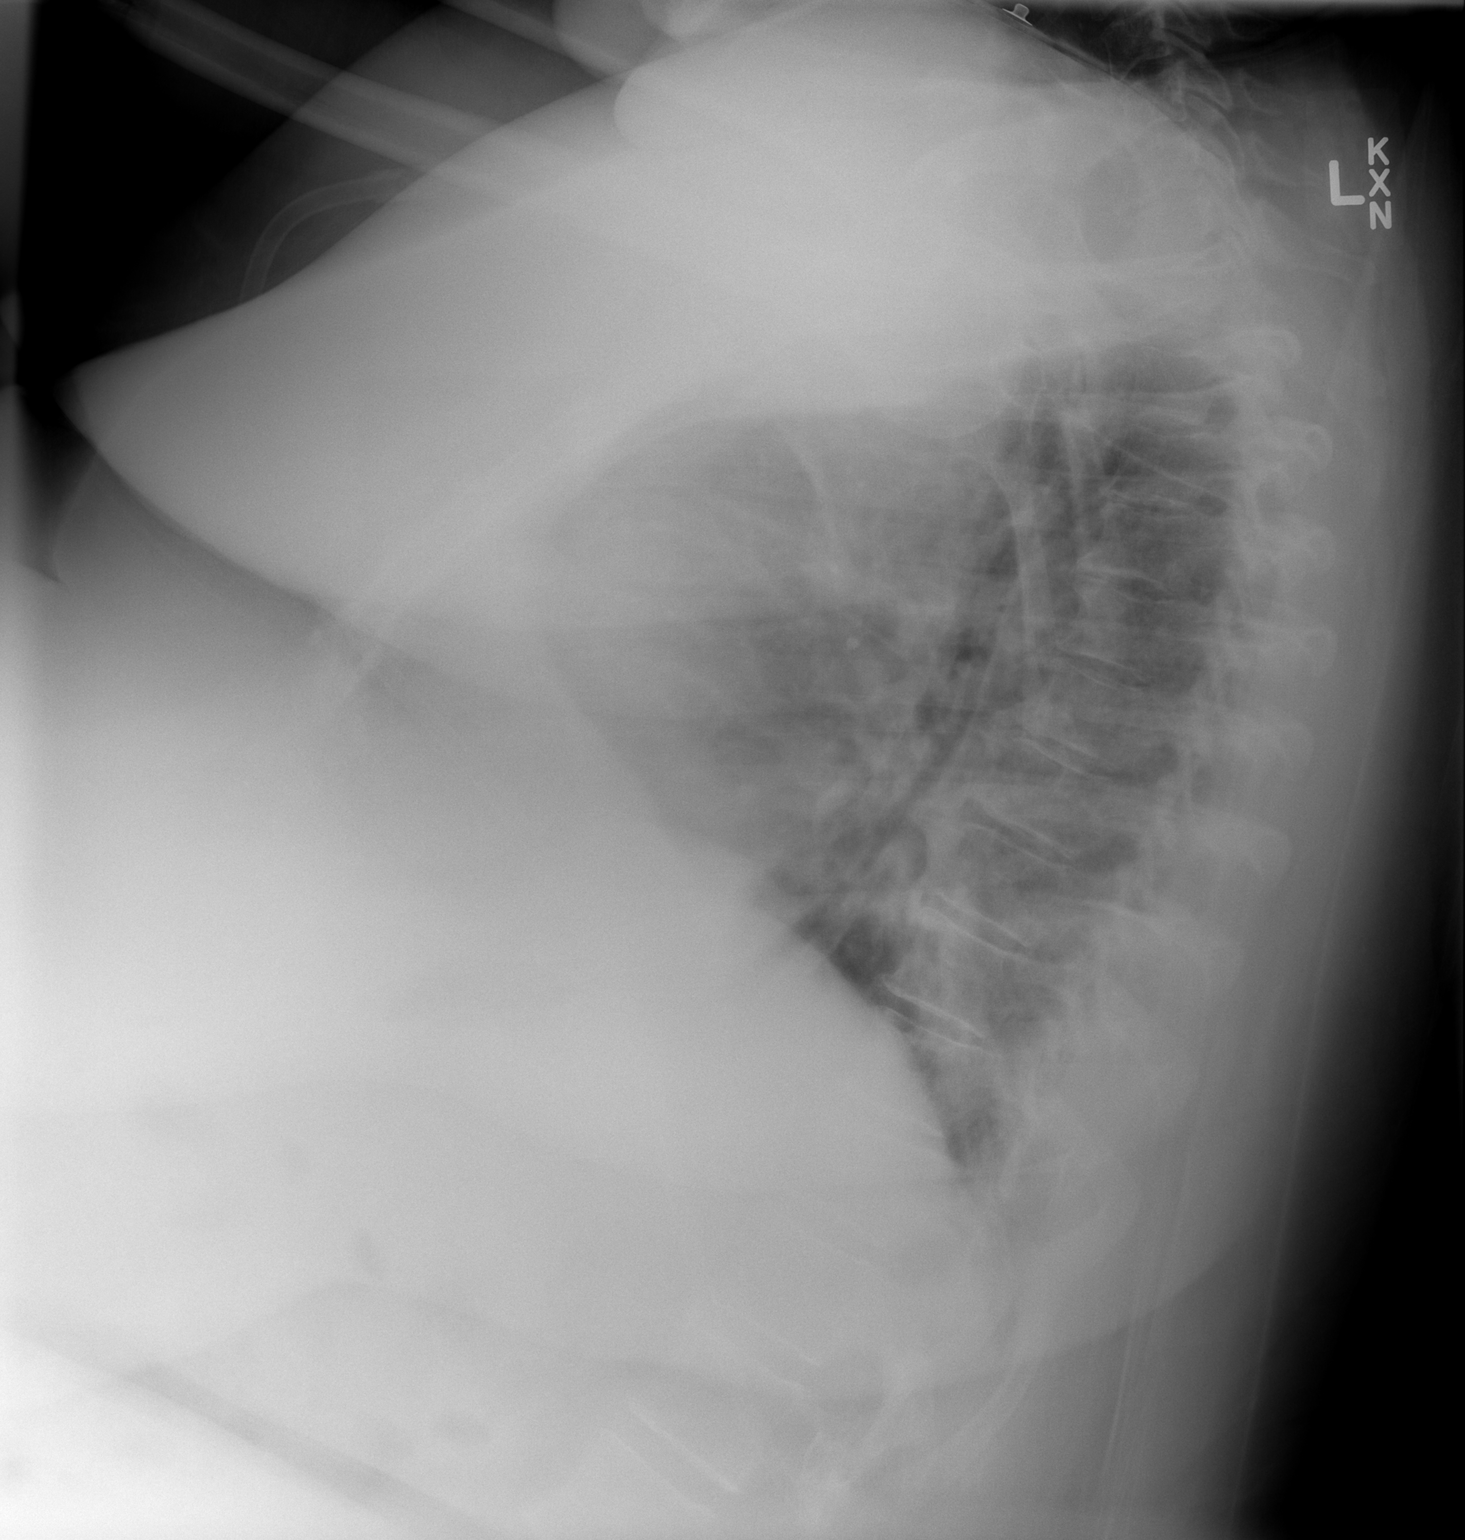

[2 of 2 positions shown; findings below may reference images not displayed]

FINDINGS: The lungs are mildly hypoexpanded.  Vascular crowding and
vascular congestion are seen.  Mildly increased interstitial
markings may reflect mild interstitial edema.  No definite pleural
effusion or pneumothorax is seen.

The heart is mildly enlarged.  No acute osseous abnormalities are
seen.
IMPRESSION: Lungs mildly hypoexpanded.  Vascular congestion and mild
cardiomegaly, with mildly increased interstitial markings, raising
concern for mild interstitial edema.

## 2014-11-30 ENCOUNTER — Encounter (HOSPITAL_COMMUNITY): Payer: Self-pay

## 2014-11-30 ENCOUNTER — Emergency Department (HOSPITAL_COMMUNITY)
Admission: EM | Admit: 2014-11-30 | Discharge: 2014-12-01 | Disposition: A | Discharge status: Home / Self Care | Payer: Medicaid Other | Attending: Emergency Medicine | Admitting: Emergency Medicine

## 2014-11-30 DIAGNOSIS — Q828 Other specified congenital malformations of skin: Secondary | ICD-10-CM | POA: Insufficient documentation

## 2014-11-30 DIAGNOSIS — J45901 Unspecified asthma with (acute) exacerbation: Secondary | ICD-10-CM | POA: Diagnosis not present

## 2014-11-30 DIAGNOSIS — Z87891 Personal history of nicotine dependence: Secondary | ICD-10-CM | POA: Insufficient documentation

## 2014-11-30 DIAGNOSIS — M1990 Unspecified osteoarthritis, unspecified site: Secondary | ICD-10-CM | POA: Insufficient documentation

## 2014-11-30 DIAGNOSIS — Z9981 Dependence on supplemental oxygen: Secondary | ICD-10-CM | POA: Insufficient documentation

## 2014-11-30 DIAGNOSIS — I10 Essential (primary) hypertension: Secondary | ICD-10-CM | POA: Diagnosis not present

## 2014-11-30 DIAGNOSIS — I509 Heart failure, unspecified: Secondary | ICD-10-CM | POA: Diagnosis not present

## 2014-11-30 DIAGNOSIS — Z7951 Long term (current) use of inhaled steroids: Secondary | ICD-10-CM | POA: Diagnosis not present

## 2014-11-30 DIAGNOSIS — R059 Cough, unspecified: Secondary | ICD-10-CM

## 2014-11-30 DIAGNOSIS — Z7982 Long term (current) use of aspirin: Secondary | ICD-10-CM | POA: Insufficient documentation

## 2014-11-30 DIAGNOSIS — Z590 Homelessness: Secondary | ICD-10-CM | POA: Insufficient documentation

## 2014-11-30 DIAGNOSIS — G8929 Other chronic pain: Secondary | ICD-10-CM | POA: Insufficient documentation

## 2014-11-30 DIAGNOSIS — R0602 Shortness of breath: Secondary | ICD-10-CM

## 2014-11-30 DIAGNOSIS — Z79899 Other long term (current) drug therapy: Secondary | ICD-10-CM | POA: Insufficient documentation

## 2014-11-30 DIAGNOSIS — G4733 Obstructive sleep apnea (adult) (pediatric): Secondary | ICD-10-CM | POA: Diagnosis not present

## 2014-11-30 DIAGNOSIS — Z8614 Personal history of Methicillin resistant Staphylococcus aureus infection: Secondary | ICD-10-CM | POA: Diagnosis not present

## 2014-11-30 DIAGNOSIS — Z880 Allergy status to penicillin: Secondary | ICD-10-CM | POA: Diagnosis not present

## 2014-11-30 DIAGNOSIS — Z7952 Long term (current) use of systemic steroids: Secondary | ICD-10-CM | POA: Insufficient documentation

## 2014-11-30 DIAGNOSIS — R05 Cough: Secondary | ICD-10-CM

## 2014-11-30 DIAGNOSIS — L309 Dermatitis, unspecified: Secondary | ICD-10-CM | POA: Insufficient documentation

## 2014-11-30 NOTE — ED Notes (Signed)
Pt complains of a productive cough for three days and states that her legs are red and swollen

## 2014-12-01 ENCOUNTER — Emergency Department (HOSPITAL_COMMUNITY): Payer: Medicaid Other

## 2014-12-01 ENCOUNTER — Inpatient Hospital Stay (HOSPITAL_COMMUNITY)
Admission: AD | Admit: 2014-12-01 | Discharge: 2014-12-02 | Discharge status: Against Medical Advice | Payer: Medicaid Other | Source: Ambulatory Visit | Attending: Obstetrics & Gynecology | Admitting: Obstetrics & Gynecology

## 2014-12-01 ENCOUNTER — Encounter (HOSPITAL_COMMUNITY): Payer: Self-pay | Admitting: Emergency Medicine

## 2014-12-01 ENCOUNTER — Other Ambulatory Visit (HOSPITAL_COMMUNITY): Payer: Medicaid Other

## 2014-12-01 DIAGNOSIS — R0602 Shortness of breath: Secondary | ICD-10-CM

## 2014-12-01 LAB — URINALYSIS, ROUTINE W REFLEX MICROSCOPIC
Bilirubin Urine: NEGATIVE
Glucose, UA: NEGATIVE mg/dL
Hgb urine dipstick: NEGATIVE
Ketones, ur: NEGATIVE mg/dL
Leukocytes, UA: NEGATIVE
Nitrite: NEGATIVE
Protein, ur: NEGATIVE mg/dL
Specific Gravity, Urine: >1.03 — ABNORMAL HIGH (ref 1.005–1.030)
Urobilinogen, UA: 0.2 mg/dL (ref 0.0–1.0)
pH: 5 (ref 5.0–8.0)

## 2014-12-01 LAB — CBC WITH DIFFERENTIAL/PLATELET
Basophils Absolute: 0 10*3/uL (ref 0.0–0.1)
Basophils Relative: 0 % (ref 0–1)
EOS ABS: 0.3 10*3/uL (ref 0.0–0.7)
EOS PCT: 2 % (ref 0–5)
HCT: 43.9 % (ref 36.0–46.0)
Hemoglobin: 13.3 g/dL (ref 12.0–15.0)
LYMPHS ABS: 2.1 10*3/uL (ref 0.7–4.0)
Lymphocytes Relative: 15 % (ref 12–46)
MCH: 28.8 pg (ref 26.0–34.0)
MCHC: 30.3 g/dL (ref 30.0–36.0)
MCV: 95 fL (ref 78.0–100.0)
MONOS PCT: 7 % (ref 3–12)
Monocytes Absolute: 1 10*3/uL (ref 0.1–1.0)
Neutro Abs: 10.5 10*3/uL — ABNORMAL HIGH (ref 1.7–7.7)
Neutrophils Relative %: 76 % (ref 43–77)
PLATELETS: 355 10*3/uL (ref 150–400)
RBC: 4.62 MIL/uL (ref 3.87–5.11)
RDW: 15.8 % — AB (ref 11.5–15.5)
WBC: 13.8 10*3/uL — AB (ref 4.0–10.5)

## 2014-12-01 LAB — I-STAT CHEM 8, ED
BUN: 25 mg/dL — ABNORMAL HIGH (ref 6–20)
CALCIUM ION: 1.01 mmol/L — AB (ref 1.12–1.23)
Chloride: 97 mmol/L — ABNORMAL LOW (ref 101–111)
Creatinine, Ser: 1.5 mg/dL — ABNORMAL HIGH (ref 0.44–1.00)
Glucose, Bld: 115 mg/dL — ABNORMAL HIGH (ref 65–99)
HCT: 45 % (ref 36.0–46.0)
Hemoglobin: 15.3 g/dL — ABNORMAL HIGH (ref 12.0–15.0)
Potassium: 4.2 mmol/L (ref 3.5–5.1)
SODIUM: 139 mmol/L (ref 135–145)
TCO2: 33 mmol/L (ref 0–100)

## 2014-12-01 LAB — I-STAT TROPONIN, ED: TROPONIN I, POC: 0 ng/mL (ref 0.00–0.08)

## 2014-12-01 MED ORDER — BENZONATATE 100 MG PO CAPS
100.0000 mg | ORAL_CAPSULE | Freq: Three times a day (TID) | ORAL | Status: DC
Start: 2014-12-01 — End: 2015-06-09

## 2014-12-01 MED ORDER — DIPHENHYDRAMINE HCL 50 MG/ML IJ SOLN
12.5000 mg | Freq: Once | INTRAMUSCULAR | Status: AC
  Administered 2014-12-01: 12.5 mg via INTRAVENOUS
  Filled 2014-12-01: qty 1

## 2014-12-01 MED ORDER — TECHNETIUM TC 99M DIETHYLENETRIAME-PENTAACETIC ACID: 43.0000 | Freq: Once | INTRAVENOUS | Status: DC | PRN

## 2014-12-01 MED ORDER — TECHNETIUM TO 99M ALBUMIN AGGREGATED
6.5000 | Freq: Once | INTRAVENOUS | Status: AC | PRN
  Administered 2014-12-01: 7 via INTRAVENOUS

## 2014-12-01 NOTE — ED Notes (Signed)
Pt is complaining of itching and right side abd pain. Pt was provided a big gown.

## 2014-12-01 NOTE — ED Notes (Signed)
Pt returned from nuclear medicine

## 2014-12-01 NOTE — MAU Note (Addendum)
Lori Clemmons CNM in Triage to talk with pt. Pt will be transferred to [facility] ED for further eval. Pt out in lobby by scooter

## 2014-12-01 NOTE — ED Provider Notes (Signed)
CSN: [id number]     Arrival date & time 11/30/14  2221 History   First MD Initiated Contact with Patient 11/30/14 2307     Chief Complaint  Patient presents with  . Cough     (Consider location/radiation/quality/duration/timing/severity/associated sxs/prior Treatment) Patient is a 49 y.o. female presenting with cough. The history is provided by the patient.  Cough Cough characteristics:  Non-productive Severity:  Mild Onset quality:  Gradual Duration:  2 days Timing:  Intermittent Progression:  Unchanged Chronicity:  New Smoker: no   Context: not animal exposure   Relieved by:  Nothing Worsened by:  Nothing tried Ineffective treatments:  None tried Associated symptoms: no chest pain, no chills, no diaphoresis, no fever, no rash, no shortness of breath, no weight loss and no wheezing   Risk factors: no chemical exposure and no recent travel     Past Medical History  Diagnosis Date  . Asthma   . GERD (gastroesophageal reflux disease)   . Hypertension   . Homelessness   . Low back pain   . 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 Arthritis   . Gout   . Hyperlipemia   . Morbid obesity     uses motor wheel chair  . MRSA (methicillin resistant Staphylococcus aureus)     states about a year ago  . CHF (congestive heart failure)   . Chronic abdominal pain   . Hidradenitis   . Chronic pain     "all over"  . Renal insufficiency   . On home O2     3L N/C O2 continuously  . OSA (obstructive sleep apnea)     non-compliant with CPAP   Past Surgical History  Procedure Laterality Date  . Tubal ligation    . Cesarean section     Family History  Problem Relation Age of Onset  . Asthma Mother   . Diabetes Father   . Heart disease Father   . Stroke Father    Social History  Substance Use Topics  . Smoking status: Former Smoker    Types: Cigarettes    Quit date: 03/25/2003  . Smokeless tobacco: Former Systems developer    Quit date: 05/27/1978  . Alcohol Use: No    OB History    No data available     Review of Systems  Constitutional: Negative for fever, chills, weight loss and diaphoresis.  Respiratory: Positive for cough. Negative for shortness of breath and wheezing.   Cardiovascular: Negative for chest pain and leg swelling.  Skin: Negative for rash.  All other systems reviewed and are negative.     Allergies  Ibuprofen; Adhesive; Azithromycin; Doxycycline; Penicillins; Sulfa antibiotics; and Sulfamethoxazole-trimethoprim  Home Medications   Prior to Admission medications   Medication Sig Start Date End Date Taking? Authorizing Provider  allopurinol (ZYLOPRIM) 100 MG tablet Take 1 tablet (100 mg total) by mouth every morning. 01/22/14  Yes Donne Hazel, MD  ALPRAZolam Duanne Moron) 1 MG tablet Take 1 mg by mouth at bedtime as needed for anxiety (anxiety).   Yes Historical Provider, MD  ammonium lactate (AMLACTIN) 12 % cream Apply 1 g topically daily as needed for dry skin. 04/22/14  Yes Linton Flemings, MD  aspirin 325 MG tablet Take 325 mg by mouth every morning.    Yes Historical Provider, MD  atorvastatin (LIPITOR) 20 MG tablet Take 1 tablet (20 mg total) by mouth every morning. 01/22/14  Yes Donne Hazel, MD  clotrimazole (LOTRIMIN) 1 %  cream Apply to affected area 2 times daily Patient taking differently: Apply 1 application topically 2 (two) times daily.  06/20/14  Yes Shari Upstill, PA-C  folic acid (FOLVITE) 1 MG tablet Take 1 tablet (1 mg total) by mouth daily. 01/22/14  Yes Donne Hazel, MD  furosemide (LASIX) 40 MG tablet Take 1 tablet (40 mg total) by mouth 2 (two) times daily. 01/22/14  Yes Donne Hazel, MD  hydrOXYzine (ATARAX/VISTARIL) 25 MG tablet Take 25 mg by mouth 3 (three) times daily as needed for anxiety or itching (anxiety and itching).    Yes Historical Provider, MD  ketoconazole (NIZORAL) 2 % shampoo Apply 1 application topically daily as needed for irritation (irritation).    Yes Historical Provider, MD  ketoconazole  (NIZORAL) 200 MG tablet Take 200 mg by mouth every morning.    Yes Historical Provider, MD  lisinopril (PRINIVIL,ZESTRIL) 40 MG tablet Take 40 mg by mouth daily.   Yes Historical Provider, MD  Multiple Vitamin (MULTIVITAMIN WITH MINERALS) TABS tablet Take 1 tablet by mouth daily. 01/22/14  Yes Donne Hazel, MD  pantoprazole (PROTONIX) 40 MG tablet Take 1 tablet (40 mg total) by mouth every morning. 01/22/14  Yes Donne Hazel, MD  solifenacin (VESICARE) 5 MG tablet Take 5 mg by mouth every morning.    Yes Historical Provider, MD  vitamin B-12 (CYANOCOBALAMIN) 1000 MCG tablet Take 1 tablet (1,000 mcg total) by mouth every morning. 01/22/14  Yes Donne Hazel, MD  vitamin E 100 UNIT capsule Take 1 capsule (100 Units total) by mouth every morning. 01/22/14  Yes Donne Hazel, MD  albuterol-ipratropium (COMBIVENT) [phone] MCG/ACT inhaler Inhale 2 puffs into the lungs every 4 (four) hours as needed for wheezing or shortness of breath. 11/21/14   Debbe Odea, MD  budesonide-formoterol (SYMBICORT) 160-4.5 MCG/ACT inhaler Inhale 2 puffs into the lungs 2 (two) times daily. Patient not taking: Reported on 11/21/2014 11/21/14   Debbe Odea, MD  diphenhydrAMINE (BENADRYL) 25 mg capsule Take 25 mg by mouth every 6 (six) hours as needed for itching or allergies (allergies).     Historical Provider, MD  Emollient (EUCERIN) lotion Apply 5 mLs topically 2 (two) times daily as needed for dry skin. Patient taking differently: Apply 5 mLs topically 3 (three) times daily.  04/22/14   Linton Flemings, MD  guaiFENesin (MUCINEX) 600 MG 12 hr tablet Take 1 tablet (600 mg total) by mouth 2 (two) times daily. 11/21/14   Debbe Odea, MD  predniSONE (DELTASONE) 20 MG tablet Take 1 tablet (20 mg total) by mouth daily with breakfast. 11/21/14   Debbe Odea, MD   BP 124/51 mmHg  Pulse 99  Temp(Src) 98.6 F (37 C) (Oral)  Resp 20  SpO2 96%  LMP 03/24/2009 Physical Exam  Constitutional: She is oriented to person, place, and time.  She appears well-developed and well-nourished. No distress.  HENT:  Head: Normocephalic and atraumatic.  Mouth/Throat: Oropharynx is clear and moist.  Eyes: Conjunctivae and EOM are normal. Pupils are equal, round, and reactive to light.  Neck: Normal range of motion. Neck supple.  Cardiovascular: Normal rate, regular rhythm and intact distal pulses.   Pulmonary/Chest: Effort normal and breath sounds normal. No respiratory distress. She has no wheezes. She has no rales.  Abdominal: Soft. Bowel sounds are normal. There is no tenderness. There is no rebound and no guarding.  Musculoskeletal: Normal range of motion. She exhibits no edema or tenderness.  Neurological: She is alert and oriented to person, place,  and time. She has normal reflexes.  Skin: Skin is warm and dry.  Pustular ezcema  Psychiatric: She has a normal mood and affect.    ED Course  Procedures (including critical care time) Labs Review Labs Reviewed  CBC WITH DIFFERENTIAL/PLATELET - Abnormal; Notable for the following:    WBC 13.8 (*)    RDW 15.8 (*)    Neutro Abs 10.5 (*)    All other components within normal limits  I-STAT CHEM 8, ED - Abnormal; Notable for the following:    Chloride 97 (*)    BUN 25 (*)    Creatinine, Ser 1.50 (*)    Glucose, Bld 115 (*)    Calcium, Ion 1.01 (*)    Hemoglobin 15.3 (*)    All other components within normal limits  Randolm Idol, ED    Imaging Review Dg Chest 2 View  12/01/2014   CLINICAL DATA:  49 year old female with cough  EXAM: CHEST  2 VIEW  COMPARISON:  Radiograph dated 11/22/2014  FINDINGS: Stable cardiomegaly. Both lungs are clear. The visualized skeletal structures are unremarkable.  IMPRESSION: No active cardiopulmonary disease.   Electronically Signed   By: Anner Crete M.D.   On: 12/01/2014 00:55   Nm Pulmonary Perf And Vent  12/01/2014   CLINICAL DATA:  Shortness of breath for several years, getting worse. Right-sided chest pain. History of asthma and  bronchitis.  EXAM: NUCLEAR MEDICINE VENTILATION - PERFUSION LUNG SCAN  TECHNIQUE: Ventilation images were obtained in multiple projections using inhaled aerosol Tc-104m DTPA. Perfusion images were obtained in multiple projections after intravenous injection of Tc-25m MAA.  RADIOPHARMACEUTICALS:  43 mCi Technetium-16m DTPA aerosol inhalation and 6.5 mCi Technetium-83m MAA IV  COMPARISON:  Chest radiograph 12/01/2014  FINDINGS: Ventilation: There is somewhat decreased and in homogeneous distribution of tracer activity throughout the lungs suggesting poor inspiration versus air trapping. No discrete focal segmental defects are identified.  Perfusion: Mostly homogeneous distribution tracer activity within both lungs. No focal segmental defects.  IMPRESSION: Low probability of pulmonary embolus.   Electronically Signed   By: Lucienne Capers M.D.   On: 12/01/2014 06:57   I have personally reviewed and evaluated these images and lab results as part of my medical decision-making.   EKG Interpretation   Date/Time:  Thursday November 30 2014 23:51:48 EDT Ventricular Rate:  82 PR Interval:  139 QRS Duration: 102 QT Interval:  382 QTC Calculation: 446 R Axis:   58 Text Interpretation:  Sinus rhythm Low voltage, precordial leads Confirmed  by [facility]  MD, Cledith Abdou ([id number]) on 12/01/2014 1:36:02 AM      MDM   Final diagnoses:  Shortness of breath   Results for orders placed or performed during the hospital encounter of 11/30/14  CBC with Differential/Platelet  Result Value Ref Range   WBC 13.8 (H) 4.0 - 10.5 K/uL   RBC 4.62 3.87 - 5.11 MIL/uL   Hemoglobin 13.3 12.0 - 15.0 g/dL   HCT 43.9 36.0 - 46.0 %   MCV 95.0 78.0 - 100.0 fL   MCH 28.8 26.0 - 34.0 pg   MCHC 30.3 30.0 - 36.0 g/dL   RDW 15.8 (H) 11.5 - 15.5 %   Platelets 355 150 - 400 K/uL   Neutrophils Relative % 76 43 - 77 %   Neutro Abs 10.5 (H) 1.7 - 7.7 K/uL   Lymphocytes Relative 15 12 - 46 %   Lymphs Abs 2.1 0.7 - 4.0 K/uL    Monocytes Relative 7 3 - 12 %  Monocytes Absolute 1.0 0.1 - 1.0 K/uL   Eosinophils Relative 2 0 - 5 %   Eosinophils Absolute 0.3 0.0 - 0.7 K/uL   Basophils Relative 0 0 - 1 %   Basophils Absolute 0.0 0.0 - 0.1 K/uL  I-stat chem 8, ed  Result Value Ref Range   Sodium 139 135 - 145 mmol/L   Potassium 4.2 3.5 - 5.1 mmol/L   Chloride 97 (L) 101 - 111 mmol/L   BUN 25 (H) 6 - 20 mg/dL   Creatinine, Ser 1.50 (H) 0.44 - 1.00 mg/dL   Glucose, Bld 115 (H) 65 - 99 mg/dL   Calcium, Ion 1.01 (L) 1.12 - 1.23 mmol/L   TCO2 33 0 - 100 mmol/L   Hemoglobin 15.3 (H) 12.0 - 15.0 g/dL   HCT 45.0 36.0 - 46.0 %  I-stat troponin, ED  Result Value Ref Range   Troponin i, poc 0.00 0.00 - 0.08 ng/mL   Comment 3           Dg Chest 2 View  12/01/2014   CLINICAL DATA:  49 year old female with cough  EXAM: CHEST  2 VIEW  COMPARISON:  Radiograph dated 11/22/2014  FINDINGS: Stable cardiomegaly. Both lungs are clear. The visualized skeletal structures are unremarkable.  IMPRESSION: No active cardiopulmonary disease.   Electronically Signed   By: Anner Crete M.D.   On: 12/01/2014 00:55   Dg Chest 2 View  11/22/2014   CLINICAL DATA:  Chest pain, productive cough  EXAM: CHEST  2 VIEW  COMPARISON:  11/21/2014  FINDINGS: Moderate enlargement of the cardiac silhouette reidentified with central vascular congestion. No overt focal pulmonary opacity. No pleural effusion. No acute osseous abnormality.  IMPRESSION: Cardiomegaly with central vascular congestion.   Electronically Signed   By: Conchita Paris M.D.   On: 11/22/2014 23:34   Dg Chest 2 View  11/21/2014   CLINICAL DATA:  Right chest pain and dyspnea.  EXAM: CHEST  2 VIEW  COMPARISON:  November 19, 2014  FINDINGS: The heart size and mediastinal contours are stable. The heart size is enlarged. There is increased pulmonary interstitium bilaterally. There is no focal pneumonia or pleural effusion. The visualized skeletal structures are stable.  IMPRESSION: Persistent  congestive heart failure.   Electronically Signed   By: Abelardo Diesel M.D.   On: 11/21/2014 18:33   Dg Chest 2 View  11/15/2014   CLINICAL DATA:  Right-sided chest pain and shortness of breath. Lower extremity swelling.  EXAM: CHEST  2 VIEW  COMPARISON:  09/22/2014  FINDINGS: Low lung volumes are again noted. Cardiomegaly and pulmonary vascular congestion appears stable. No evidence of pulmonary consolidation or overt pulmonary edema. No evidence of pleural effusion.  IMPRESSION: Stable low lung volumes, with cardiomegaly and pulmonary vascular congestion. No acute findings.   Electronically Signed   By: Earle Gell M.D.   On: 11/15/2014 18:10   Ct Angio Chest Pe W/cm &/or Wo Cm  11/15/2014   CLINICAL DATA:  Leg swelling and shortness of breath. Elevated D-dimer.  EXAM: CT ANGIOGRAPHY CHEST WITH CONTRAST  TECHNIQUE: Multidetector CT imaging of the chest was performed using the standard protocol during bolus administration of intravenous contrast. Multiplanar CT image reconstructions and MIPs were obtained to evaluate the vascular anatomy.  CONTRAST:  123mL OMNIPAQUE IOHEXOL 350 MG/ML SOLN  COMPARISON:  Radiographs earlier this day.  Chest CT 03/14/2012  FINDINGS: No central pulmonary embolus involving the main or lobar pulmonary arteries, technically fair/poor study. The segmental and subsegmental branches  cannot be assessed due to contrast bolus timing and soft tissue attenuation from body habitus.  There is mild multi chamber cardiomegaly. No discrete right heart strain or contrast refluxing into the IVC. Thoracic aorta is normal in caliber with mild atherosclerosis. Scattered minimal coronary artery calcifications. There is no pleural or pericardial effusion. Prominent upper paratracheal lymph node measuring 10 mm short axis dimension, unchanged. There is a prominent prevascular lymph node measuring 9 mm short axis dimension, likely reactive. No definite hilar adenopathy.  Limited assessment of lung  parenchyma, no consolidation to suggest pneumonia. No dominant pulmonary mass. Mild dependent and lingular atelectasis.  No acute abnormality in the included upper abdomen. Probable hepatomegaly.  There are no acute or suspicious osseous abnormalities. There is degenerative change in the spine.  Review of the MIP images confirms the above findings.  IMPRESSION: 1. No central pulmonary embolus in the main or lobar pulmonary arteries. The segmental and subsegmental branches cannot be assessed. 2. No acute findings.  Cardiomegaly and scattered atelectasis.   Electronically Signed   By: Jeb Levering M.D.   On: 11/15/2014 23:42   Nm Pulmonary Perf And Vent  12/01/2014   CLINICAL DATA:  Shortness of breath for several years, getting worse. Right-sided chest pain. History of asthma and bronchitis.  EXAM: NUCLEAR MEDICINE VENTILATION - PERFUSION LUNG SCAN  TECHNIQUE: Ventilation images were obtained in multiple projections using inhaled aerosol Tc-8m DTPA. Perfusion images were obtained in multiple projections after intravenous injection of Tc-59m MAA.  RADIOPHARMACEUTICALS:  43 mCi Technetium-27m DTPA aerosol inhalation and 6.5 mCi Technetium-69m MAA IV  COMPARISON:  Chest radiograph 12/01/2014  FINDINGS: Ventilation: There is somewhat decreased and in homogeneous distribution of tracer activity throughout the lungs suggesting poor inspiration versus air trapping. No discrete focal segmental defects are identified.  Perfusion: Mostly homogeneous distribution tracer activity within both lungs. No focal segmental defects.  IMPRESSION: Low probability of pulmonary embolus.   Electronically Signed   By: Lucienne Capers M.D.   On: 12/01/2014 06:57   Dg Chest Port 1 View  11/19/2014   CLINICAL DATA:  Followup of pulmonary edema. Asthma. Hypertension. Morbid obesity.  EXAM: PORTABLE CHEST - 1 VIEW  COMPARISON:  11/15/2014  FINDINGS: degraded exam due to AP portable technique and patient body habitus. Midline trachea.  Cardiomegaly accentuated by AP portable technique. Atherosclerosis in the transverse aorta. No right and no definite left pleural effusion. No pneumothorax. Interstitial edema is likely similar, given improved inspiratory effort on the current exam. Probable left base atelectasis versus superimposition of overlying soft tissues.  IMPRESSION: Cardiomegaly with persistent congestive heart failure.  Decreased sensitivity and specificity exam due to technique related factors, as described above.   Electronically Signed   By: Abigail Miyamoto M.D.   On: 11/19/2014 08:46    Safe for discharge to home.  No PE, low risk and no CHF.  Sleeping in room soundly     Kendon Sedeno, MD 12/01/14 [phone]

## 2014-12-01 NOTE — Discharge Instructions (Signed)
Cool Mist Vaporizers °Vaporizers may help relieve the symptoms of a cough and cold. They add moisture to the air, which helps mucus to become thinner and less sticky. This makes it easier to breathe and cough up secretions. Cool mist vaporizers do not cause serious burns like hot mist vaporizers, which may also be called steamers or humidifiers. Vaporizers have not been proven to help with colds. You should not use a vaporizer if you are allergic to mold. °HOME CARE INSTRUCTIONS °· Follow the package instructions for the vaporizer. °· Do not use anything other than distilled water in the vaporizer. °· Do not run the vaporizer all of the time. This can cause mold or bacteria to grow in the vaporizer. °· Clean the vaporizer after each time it is used. °· Clean and dry the vaporizer well before storing it. °· Stop using the vaporizer if worsening respiratory symptoms develop. °Document Released: 12/06/2003 Document Revised: 03/15/2013 Document Reviewed: 07/28/2012 °ExitCare® Patient Information ©2015 [employer]. This information is not intended to replace advice given to you by your health care provider. Make sure you discuss any questions you have with your health care provider. ° °

## 2014-12-01 NOTE — ED Notes (Signed)
Patient in nuclear medicine.

## 2014-12-01 NOTE — MAU Note (Signed)
Having SOB, swelling and pain in lower legs. Seen this am at [facility] due to asthma attack. Abd pain and vomiting. Knots in vaginal area. Denies vag bleeding or d/c. Periods stopped for yrs.

## 2014-12-02 NOTE — MAU Note (Signed)
Pt's scooter will not fit on cab. House coverage, Brianne, called to come talk with pt.

## 2014-12-02 NOTE — MAU Note (Signed)
Pt talking to people in lobby. Aware [facility] coming to transport to [facility] ED

## 2014-12-02 NOTE — MAU Note (Signed)
Brianne RN [facility] came to talk with pt. AC told pt she could stay tonight in lobby until bus runs in AM. Pt given blankets.

## 2014-12-02 NOTE — MAU Note (Signed)
Pt signed refusal for transfer and AMA form. Pt to lobby to await [employer] who can transport pt to [facility] ED with her scooter.

## 2014-12-02 NOTE — MAU Note (Signed)
[facility] here but pt refuses to go to [facility] via [facility] without her scooter. She will not separate from her scooter and [place] and [facility] cannot transport pt and scooter. [facility] security  Unable to transport scooter and pt refuses to separate from scooter even if someone could transport it for her. [employer] called and checking on possible transport. Lori Clemmons CNM out to talk with pt. If taxi can transport pt understands she will have to sign out AMA and then go by taxi to [facility] ED

## 2014-12-04 ENCOUNTER — Telehealth: Payer: Self-pay

## 2014-12-04 ENCOUNTER — Encounter (HOSPITAL_COMMUNITY): Payer: Self-pay | Admitting: Emergency Medicine

## 2014-12-04 ENCOUNTER — Emergency Department (HOSPITAL_COMMUNITY)
Admission: EM | Admit: 2014-12-04 | Discharge: 2014-12-05 | Disposition: A | Discharge status: Home / Self Care | Payer: Medicaid Other | Attending: Emergency Medicine | Admitting: Emergency Medicine

## 2014-12-04 DIAGNOSIS — G8929 Other chronic pain: Secondary | ICD-10-CM | POA: Insufficient documentation

## 2014-12-04 DIAGNOSIS — M109 Gout, unspecified: Secondary | ICD-10-CM | POA: Insufficient documentation

## 2014-12-04 DIAGNOSIS — R05 Cough: Secondary | ICD-10-CM | POA: Diagnosis present

## 2014-12-04 DIAGNOSIS — I509 Heart failure, unspecified: Secondary | ICD-10-CM | POA: Insufficient documentation

## 2014-12-04 DIAGNOSIS — M1990 Unspecified osteoarthritis, unspecified site: Secondary | ICD-10-CM | POA: Insufficient documentation

## 2014-12-04 DIAGNOSIS — R1110 Vomiting, unspecified: Secondary | ICD-10-CM | POA: Diagnosis not present

## 2014-12-04 DIAGNOSIS — Z8614 Personal history of Methicillin resistant Staphylococcus aureus infection: Secondary | ICD-10-CM | POA: Diagnosis not present

## 2014-12-04 DIAGNOSIS — M7989 Other specified soft tissue disorders: Secondary | ICD-10-CM | POA: Diagnosis not present

## 2014-12-04 DIAGNOSIS — R059 Cough, unspecified: Secondary | ICD-10-CM

## 2014-12-04 DIAGNOSIS — K219 Gastro-esophageal reflux disease without esophagitis: Secondary | ICD-10-CM | POA: Insufficient documentation

## 2014-12-04 DIAGNOSIS — Z8669 Personal history of other diseases of the nervous system and sense organs: Secondary | ICD-10-CM | POA: Insufficient documentation

## 2014-12-04 DIAGNOSIS — Z7952 Long term (current) use of systemic steroids: Secondary | ICD-10-CM | POA: Insufficient documentation

## 2014-12-04 DIAGNOSIS — I10 Essential (primary) hypertension: Secondary | ICD-10-CM | POA: Diagnosis not present

## 2014-12-04 DIAGNOSIS — R0602 Shortness of breath: Secondary | ICD-10-CM

## 2014-12-04 DIAGNOSIS — Z880 Allergy status to penicillin: Secondary | ICD-10-CM | POA: Diagnosis not present

## 2014-12-04 DIAGNOSIS — Z7951 Long term (current) use of inhaled steroids: Secondary | ICD-10-CM | POA: Diagnosis not present

## 2014-12-04 DIAGNOSIS — J45901 Unspecified asthma with (acute) exacerbation: Secondary | ICD-10-CM | POA: Insufficient documentation

## 2014-12-04 DIAGNOSIS — Z79899 Other long term (current) drug therapy: Secondary | ICD-10-CM | POA: Insufficient documentation

## 2014-12-04 DIAGNOSIS — Z9981 Dependence on supplemental oxygen: Secondary | ICD-10-CM | POA: Insufficient documentation

## 2014-12-04 DIAGNOSIS — Z872 Personal history of diseases of the skin and subcutaneous tissue: Secondary | ICD-10-CM | POA: Diagnosis not present

## 2014-12-04 DIAGNOSIS — Z590 Homelessness: Secondary | ICD-10-CM | POA: Diagnosis not present

## 2014-12-04 DIAGNOSIS — Z7982 Long term (current) use of aspirin: Secondary | ICD-10-CM | POA: Insufficient documentation

## 2014-12-04 DIAGNOSIS — Z87891 Personal history of nicotine dependence: Secondary | ICD-10-CM | POA: Insufficient documentation

## 2014-12-04 DIAGNOSIS — E785 Hyperlipidemia, unspecified: Secondary | ICD-10-CM | POA: Diagnosis not present

## 2014-12-04 DIAGNOSIS — Z9119 Patient's noncompliance with other medical treatment and regimen: Secondary | ICD-10-CM | POA: Diagnosis not present

## 2014-12-04 NOTE — Telephone Encounter (Signed)
Call placed to the patient to check on her status. She said that she is still homeless and spent last night at the [facility]. She said that she is still worried about her breathing and reported that she is producing yellow sputum.  She said that she is not "feeling well."  She informed this CM that she is thinking about going to the ED to have her breathing checked again. She said that she has been to the [facility] and there are no beds available for her. She noted that she has all of her medications and has been taking them and using her inhalers but she it not feeling better.  Instructed her regarding the importance of going to the ED to have her breathing evaluated and she verbalized understanding.   She said that she does not have the money to stay at a motel tonight and may stay at the hospital lobby  if necessary.     She noted that she was currently at [employer] doing her laundry.  She stated that she is using her scooter as her transportation.    Discussed the services provided at [facility], including pharmacy, social work and financial counseling as well as primary medical care. She was agreeable to scheduling another visit and and appt was scheduled for 12/11/2014 @ 0945 in the [facility].  She said that she would remember the day/time of the appt.

## 2014-12-04 NOTE — ED Notes (Signed)
Pt c/o cough x3-4 days, SOB, bilateral leg swelling.

## 2014-12-05 LAB — I-STAT CHEM 8, ED
BUN: 17 mg/dL (ref 6–20)
Calcium, Ion: 1.06 mmol/L — ABNORMAL LOW (ref 1.12–1.23)
Chloride: 99 mmol/L — ABNORMAL LOW (ref 101–111)
Creatinine, Ser: 1.3 mg/dL — ABNORMAL HIGH (ref 0.44–1.00)
Glucose, Bld: 111 mg/dL — ABNORMAL HIGH (ref 65–99)
HEMATOCRIT: 43 % (ref 36.0–46.0)
HEMOGLOBIN: 14.6 g/dL (ref 12.0–15.0)
Potassium: 3.8 mmol/L (ref 3.5–5.1)
SODIUM: 141 mmol/L (ref 135–145)
TCO2: 30 mmol/L (ref 0–100)

## 2014-12-05 LAB — BRAIN NATRIURETIC PEPTIDE: B NATRIURETIC PEPTIDE 5: 30.6 pg/mL (ref 0.0–100.0)

## 2014-12-05 LAB — CBC WITH DIFFERENTIAL/PLATELET
BASOS ABS: 0 10*3/uL (ref 0.0–0.1)
BASOS PCT: 0 % (ref 0–1)
EOS ABS: 0.3 10*3/uL (ref 0.0–0.7)
Eosinophils Relative: 3 % (ref 0–5)
HCT: 42.7 % (ref 36.0–46.0)
HEMOGLOBIN: 12.5 g/dL (ref 12.0–15.0)
Lymphocytes Relative: 26 % (ref 12–46)
Lymphs Abs: 2.7 10*3/uL (ref 0.7–4.0)
MCH: 28.3 pg (ref 26.0–34.0)
MCHC: 29.3 g/dL — ABNORMAL LOW (ref 30.0–36.0)
MCV: 96.6 fL (ref 78.0–100.0)
Monocytes Absolute: 0.5 10*3/uL (ref 0.1–1.0)
Monocytes Relative: 5 % (ref 3–12)
NEUTROS PCT: 66 % (ref 43–77)
Neutro Abs: 6.8 10*3/uL (ref 1.7–7.7)
PLATELETS: 300 10*3/uL (ref 150–400)
RBC: 4.42 MIL/uL (ref 3.87–5.11)
RDW: 15.9 % — ABNORMAL HIGH (ref 11.5–15.5)
WBC: 10.3 10*3/uL (ref 4.0–10.5)

## 2014-12-05 LAB — I-STAT TROPONIN, ED: TROPONIN I, POC: 0.04 ng/mL (ref 0.00–0.08)

## 2014-12-05 MED ORDER — IPRATROPIUM BROMIDE 0.02 % IN SOLN
0.5000 mg | Freq: Once | RESPIRATORY_TRACT | Status: AC
  Administered 2014-12-05: 0.5 mg via RESPIRATORY_TRACT
  Filled 2014-12-05: qty 2.5

## 2014-12-05 MED ORDER — PREDNISONE 20 MG PO TABS
60.0000 mg | ORAL_TABLET | Freq: Once | ORAL | Status: AC
  Administered 2014-12-05: 60 mg via ORAL
  Filled 2014-12-05: qty 3

## 2014-12-05 MED ORDER — ALBUTEROL SULFATE (2.5 MG/3ML) 0.083% IN NEBU
5.0000 mg | INHALATION_SOLUTION | Freq: Once | RESPIRATORY_TRACT | Status: AC
  Administered 2014-12-05: 5 mg via RESPIRATORY_TRACT
  Filled 2014-12-05: qty 6

## 2014-12-05 NOTE — ED Notes (Signed)
Pt refused to attempt to ambulate due to back pain. States she is requesting to be admitted because she is homeless. Offered assistance with shelter placement but she declined 'because all the shelters were full last night'. After i offered to assist with by getting case management involved in her placement she replied "what do I have to do to get admitted? I have no home. Are you hearing me? Do I have to say that I am more short of breath or that I have chest pain? "

## 2014-12-05 NOTE — Progress Notes (Addendum)
1018 Cm updated ED PA/NP about conversation with the pt   [phone] ED CM consulted for home health services. ED CM spoke with patient. Patient informed ED CM she has no place to stay. Pt states she has been staying in hotels and receive SSI check the first of every month and is now awaiting more money on December 23, 2014 Pt states she feels she was discharged too early previously Pt states she has no family members in [place] she has a son and father 300 miles away Pt asked CM "they take your money don't they" in reference to assisted living and nursing homes "I can't go out like that" " I got bills to pay" patient states [facility] with a female there but has not been able to do get a bed. When Cm inquired why pt was recently hospitalized she informed Cm she had issues with her asthma.  Cm inquired if pt was on oxygen and ws informed "Yes my oxygen and stuff is in my storage unit"  Pt told CM she wanted EDP to admit her to the hospital. CM discussed with patient there was not a reason for admission per Medicare guidelines. Pt asked for a bus ticket states she has a scooter for mobility Patient not meeting home health candidacy at this time related to not having a place to stay and no teachable primary caregiver CM discussed consulting with social worker and returning to speak with patient later

## 2014-12-05 NOTE — Discharge Instructions (Signed)
Cough, Adult  A cough is a reflex that helps clear your throat and airways. It can help heal the body or may be a reaction to an irritated airway. A cough may only last 2 or 3 weeks (acute) or may last more than 8 weeks (chronic).  CAUSES Acute cough:  Viral or bacterial infections. Chronic cough:  Infections.  Allergies.  Asthma.  Post-nasal drip.  Smoking.  Heartburn or acid reflux.  Some medicines.  Chronic lung problems (COPD).  Cancer. SYMPTOMS   Cough.  Fever.  Chest pain.  Increased breathing rate.  High-pitched whistling sound when breathing (wheezing).  Colored mucus that you cough up (sputum). TREATMENT   A bacterial cough may be treated with antibiotic medicine.  A viral cough must run its course and will not respond to antibiotics.  Your caregiver may recommend other treatments if you have a chronic cough. HOME CARE INSTRUCTIONS   Only take over-the-counter or prescription medicines for pain, discomfort, or fever as directed by your caregiver. Use cough suppressants only as directed by your caregiver.  Use a cold steam vaporizer or humidifier in your bedroom or home to help loosen secretions.  Sleep in a semi-upright position if your cough is worse at night.  Rest as needed.  Stop smoking if you smoke. SEEK IMMEDIATE MEDICAL CARE IF:   You have pus in your sputum.  Your cough starts to worsen.  You cannot control your cough with suppressants and are losing sleep.  You begin coughing up blood.  You have difficulty breathing.  You develop pain which is getting worse or is uncontrolled with medicine.  You have a fever. MAKE SURE YOU:   Understand these instructions.  Will watch your condition.  Will get help right away if you are not doing well or get worse. Document Released: 09/06/2010 Document Revised: 06/02/2011 Document Reviewed: 09/06/2010 ExitCare Patient Information 2015 [employer]. This information is not intended  to replace advice given to you by your health care provider. Make sure you discuss any questions you have with your health care provider.  

## 2014-12-05 NOTE — ED Provider Notes (Signed)
CSN: [id number]     Arrival date & time 12/04/14  2344 History   First MD Initiated Contact with Patient 12/05/14 0402     Chief Complaint  Patient presents with  . Cough  . Leg Swelling     (Consider location/radiation/quality/duration/timing/severity/associated sxs/prior Treatment) HPI Comments: 49 year old female with history of super morbid obesity, hypertension, asthma, OSA, chronic O2 requirement (on 3L at home), presents to the ED for evaluation of cough x 4 days. Cough is intermittent. Patient states that cough is productive of mucous; she has had sporadic emesis of this mucous as well. Patient reports associated SOB. She also feels as though her b/l feet are swollen. Patient last evaluated for similar symptoms 4 days ago. She is homeless and spends most of her time in a motorized wheelchair.  The history is provided by the patient. No language interpreter was used.    Past Medical History  Diagnosis Date  . Asthma   . GERD (gastroesophageal reflux disease)   . Hypertension   . Homelessness   . Low back pain   . 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 Arthritis   . Gout   . Hyperlipemia   . Morbid obesity     uses motor wheel chair  . MRSA (methicillin resistant Staphylococcus aureus)     states about a year ago  . CHF (congestive heart failure)   . Chronic abdominal pain   . Hidradenitis   . Chronic pain     "all over"  . Renal insufficiency   . On home O2     3L N/C O2 continuously  . OSA (obstructive sleep apnea)     non-compliant with CPAP   Past Surgical History  Procedure Laterality Date  . Tubal ligation    . Cesarean section     Family History  Problem Relation Age of Onset  . Asthma Mother   . Diabetes Father   . Heart disease Father   . Stroke Father    Social History  Substance Use Topics  . Smoking status: Former Smoker    Types: Cigarettes    Quit date: 03/25/2003  . Smokeless tobacco: Former Systems developer    Quit date: 05/27/1978   . Alcohol Use: No   OB History    No data available      Review of Systems  Constitutional: Negative for fever.  Respiratory: Positive for cough and shortness of breath.   Cardiovascular: Positive for leg swelling.  Gastrointestinal: Positive for vomiting.  All other systems reviewed and are negative.   Allergies  Ibuprofen; Adhesive; Azithromycin; Doxycycline; Penicillins; Sulfa antibiotics; and Sulfamethoxazole-trimethoprim  Home Medications   Prior to Admission medications   Medication Sig Start Date End Date Taking? Authorizing Provider  allopurinol (ZYLOPRIM) 100 MG tablet Take 1 tablet (100 mg total) by mouth every morning. 01/22/14  Yes Donne Hazel, MD  ALPRAZolam Duanne Moron) 1 MG tablet Take 1 mg by mouth at bedtime as needed for anxiety (anxiety).   Yes Historical Provider, MD  aspirin 325 MG tablet Take 325 mg by mouth every morning.    Yes Historical Provider, MD  atorvastatin (LIPITOR) 20 MG tablet Take 1 tablet (20 mg total) by mouth every morning. 01/22/14  Yes Donne Hazel, MD  benzonatate (TESSALON) 100 MG capsule Take 1 capsule (100 mg total) by mouth every 8 (eight) hours. 12/01/14  Yes April Palumbo, MD  budesonide-formoterol [facility]) 160-4.5 MCG/ACT inhaler Inhale 2 puffs into the lungs  2 (two) times daily. 11/21/14  Yes Debbe Odea, MD  clotrimazole (LOTRIMIN) 1 % cream Apply to affected area 2 times daily Patient taking differently: Apply 1 application topically 2 (two) times daily.  06/20/14  Yes Charlann Lange, PA-C  diphenhydrAMINE (BENADRYL) 25 mg capsule Take 25 mg by mouth every 6 (six) hours as needed for itching or allergies (allergies).    Yes Historical Provider, MD  Emollient (EUCERIN) lotion Apply 5 mLs topically 2 (two) times daily as needed for dry skin. Patient taking differently: Apply 5 mLs topically 3 (three) times daily.  04/22/14  Yes Linton Flemings, MD  folic acid (FOLVITE) 1 MG tablet Take 1 tablet (1 mg total) by mouth daily. 01/22/14  Yes  Donne Hazel, MD  furosemide (LASIX) 40 MG tablet Take 1 tablet (40 mg total) by mouth 2 (two) times daily. 01/22/14  Yes Donne Hazel, MD  guaiFENesin (MUCINEX) 600 MG 12 hr tablet Take 1 tablet (600 mg total) by mouth 2 (two) times daily. 11/21/14  Yes Debbe Odea, MD  hydrOXYzine (ATARAX/VISTARIL) 25 MG tablet Take 25 mg by mouth 3 (three) times daily as needed for anxiety or itching (anxiety and itching).    Yes Historical Provider, MD  ketoconazole (NIZORAL) 2 % shampoo Apply 1 application topically daily as needed for irritation (irritation).    Yes Historical Provider, MD  ketoconazole (NIZORAL) 200 MG tablet Take 200 mg by mouth every morning.    Yes Historical Provider, MD  lisinopril (PRINIVIL,ZESTRIL) 40 MG tablet Take 40 mg by mouth daily.   Yes Historical Provider, MD  Multiple Vitamin (MULTIVITAMIN WITH MINERALS) TABS tablet Take 1 tablet by mouth daily. 01/22/14  Yes Donne Hazel, MD  pantoprazole (PROTONIX) 40 MG tablet Take 1 tablet (40 mg total) by mouth every morning. 01/22/14  Yes Donne Hazel, MD  solifenacin (VESICARE) 5 MG tablet Take 5 mg by mouth every morning.    Yes Historical Provider, MD  vitamin B-12 (CYANOCOBALAMIN) 1000 MCG tablet Take 1 tablet (1,000 mcg total) by mouth every morning. 01/22/14  Yes Donne Hazel, MD  vitamin E 100 UNIT capsule Take 1 capsule (100 Units total) by mouth every morning. 01/22/14  Yes Donne Hazel, MD  albuterol-ipratropium (COMBIVENT) [phone] MCG/ACT inhaler Inhale 2 puffs into the lungs every 4 (four) hours as needed for wheezing or shortness of breath. 11/21/14   Debbe Odea, MD  ammonium lactate (AMLACTIN) 12 % cream Apply 1 g topically daily as needed for dry skin. 04/22/14   Linton Flemings, MD  predniSONE (DELTASONE) 20 MG tablet Take 1 tablet (20 mg total) by mouth daily with breakfast. 11/21/14   Debbe Odea, MD   BP 128/75 mmHg  Pulse 89  Temp(Src) 98.9 F (37.2 C) (Oral)  Resp 20  SpO2 96%  LMP 03/24/2009   Physical Exam   Constitutional: She is oriented to person, place, and time. She appears well-developed and well-nourished. No distress.  Super morbidly obese female  HENT:  Head: Normocephalic and atraumatic.  Eyes: Conjunctivae and EOM are normal. No scleral icterus.  Neck: Normal range of motion.  Cardiovascular: Normal rate, regular rhythm and intact distal pulses.   Pulmonary/Chest: Effort normal. No respiratory distress. She has wheezes. She has no rales.  Mild expiratory wheeze. Chest expansion symmetric. No tachypnea. No rales or rhonchi.  Musculoskeletal: Normal range of motion.  No pitting edema in the b/l lower extremities.  Neurological: She is alert and oriented to person, place, and time.  Skin:  Skin is warm and dry. No rash noted. She is not diaphoretic. No erythema. No pallor.  Psychiatric: She has a normal mood and affect. Her behavior is normal.  Nursing note and vitals reviewed.   ED Course  Procedures (including critical care time) Labs Review Labs Reviewed  CBC WITH DIFFERENTIAL/PLATELET - Abnormal; Notable for the following:    MCHC 29.3 (*)    RDW 15.9 (*)    All other components within normal limits  I-STAT CHEM 8, ED - Abnormal; Notable for the following:    Chloride 99 (*)    Creatinine, Ser 1.30 (*)    Glucose, Bld 111 (*)    Calcium, Ion 1.06 (*)    All other components within normal limits  BRAIN NATRIURETIC PEPTIDE  I-STAT TROPOININ, ED    Imaging Review No results found.   Nm Pulmonary Perf And Vent  12/01/2014 CLINICAL DATA: Shortness of breath for several years, getting worse. Right-sided chest pain. History of asthma and bronchitis. EXAM: NUCLEAR MEDICINE VENTILATION - PERFUSION LUNG SCAN TECHNIQUE: Ventilation images were obtained in multiple projections using inhaled aerosol Tc-84m DTPA. Perfusion images were obtained in multiple projections after intravenous injection of Tc-39m MAA. RADIOPHARMACEUTICALS: 43 mCi Technetium-42m DTPA aerosol inhalation  and 6.5 mCi Technetium-1m MAA IV COMPARISON: Chest radiograph 12/01/2014 FINDINGS: Ventilation: There is somewhat decreased and in homogeneous distribution of tracer activity throughout the lungs suggesting poor inspiration versus air trapping. No discrete focal segmental defects are identified. Perfusion: Mostly homogeneous distribution tracer activity within both lungs. No focal segmental defects. IMPRESSION: Low probability of pulmonary embolus. Electronically Signed By: Lucienne Capers M.D. On: 12/01/2014 06:57   Ct Angio Chest Pe W/cm &/or Wo Cm  11/15/2014 CLINICAL DATA: Leg swelling and shortness of breath. Elevated D-dimer. EXAM: CT ANGIOGRAPHY CHEST WITH CONTRAST TECHNIQUE: Multidetector CT imaging of the chest was performed using the standard protocol during bolus administration of intravenous contrast. Multiplanar CT image reconstructions and MIPs were obtained to evaluate the vascular anatomy. CONTRAST: 124mL OMNIPAQUE IOHEXOL 350 MG/ML SOLN COMPARISON: Radiographs earlier this day. Chest CT 03/14/2012 FINDINGS: No central pulmonary embolus involving the main or lobar pulmonary arteries, technically fair/poor study. The segmental and subsegmental branches cannot be assessed due to contrast bolus timing and soft tissue attenuation from body habitus. There is mild multi chamber cardiomegaly. No discrete right heart strain or contrast refluxing into the IVC. Thoracic aorta is normal in caliber with mild atherosclerosis. Scattered minimal coronary artery calcifications. There is no pleural or pericardial effusion. Prominent upper paratracheal lymph node measuring 10 mm short axis dimension, unchanged. There is a prominent prevascular lymph node measuring 9 mm short axis dimension, likely reactive. No definite hilar adenopathy. Limited assessment of lung parenchyma, no consolidation to suggest pneumonia. No dominant pulmonary mass. Mild dependent and lingular atelectasis. No  acute abnormality in the included upper abdomen. Probable hepatomegaly. There are no acute or suspicious osseous abnormalities. There is degenerative change in the spine. Review of the MIP images confirms the above findings. IMPRESSION: 1. No central pulmonary embolus in the main or lobar pulmonary arteries. The segmental and subsegmental branches cannot be assessed. 2. No acute findings. Cardiomegaly and scattered atelectasis. Electronically Signed By: Jeb Levering M.D. On: 11/15/2014 23:42   I have personally reviewed and evaluated these images and lab results as part of my medical decision-making.   EKG Interpretation None      MDM   Final diagnoses:  Shortness of breath  Cough    Patient is a 49 year old female with a  history of noncompliance with her recommended outpatient regimen as well as super morbid obesity. She has a history of chronic CO2 retention; baseline PCO2 is approximately 60 making her PO2 80 at baseline. Patient has a baseline O2 requirement of 3L [place].  Patient presents to the emergency department for complaints of cough and shortness of breath. Patient has been seen 4 times since hospital discharge on 11/21/14 for similar complaints. Patient is afebrile toady. She has no leukocytosis. Troponin is negative and BNP is normal. Patient satting 96% or higher on 3L [place]. She has been treated in the ED today with a DuoNeb and steroids.  Patient states that she wants admission because she has "nowhere to go". I have explained to the patient that her labs and vitals are c/w her baseline and that there is no indication to admit her to the hospital today. Patient states that she needs a BiPAP machine to use at night. This has been recommended to her, the patient has been largely noncompliant with this in the past. Patient only agreed to BiPAP while in the hospital recently when threatened with the patient. I will entertain this with the patient and have Case Management see  the patient. If patient is unable to obtain a BiPAP machine, she has been recommended to use 3 L of oxygen via nasal cannula when sleeping at nighttime.  Patient signed out to Margarita Mail, PA-C at shift change. Anticipate d/c after care management consultation.   Filed Vitals:   12/04/14 2352 12/05/14 0518 12/05/14 0615 12/05/14 0630  BP: 136/73  128/75   Pulse: 83  87 89  Temp: 99 F (37.2 C)   98.9 F (37.2 C)  TempSrc: Oral  Oral Oral  Resp: 18  20 20   SpO2: 92% 82% 99% 96%     Antonietta Breach, PA-C 12/05/14 [place], MD 12/05/14 [id number]

## 2014-12-05 NOTE — Progress Notes (Signed)
Pt given a 2 page list of [place] extended stay hotels ([place] & [place])  Tue, Sep 13 Tue, Sep 20 --Under $75 [place] 3.6  Hoffman rooms in a conference hotel with casual dining, an indoor pool & a gym. Free Wi-Fi    [address] [place], [place] [address] Phone:([phone] Only nightly rates   $62 [facility] [place] 3.6  3-star hotel [employer] with freebies such as WiFi, an airport shuttle, breakfast & parking, plus a pool. Free Wi-Fi Free breakfast  [place], [place], [place] [address] [phone] Weekly rates   [phone] Extended Stay [place]. 3.0  2-star hotel [place] with kitchens, plus a seasonal outdoor pool & free breakfast. Free Wi-Fi Free breakfast  [address] [place], [place] [address] [phone] weekly & monthly rates   $41 [facility] Extended Stay 3.5  2-star hotel Extended-stay hotel with modern suites offering kitchenettes, plus Industrial/product designer & free breakfast. Free Wi-Fi Free breakfast [place], [place], [place] [address] Phone:([phone]  weekly queen $331.38 $41.99 daily and monthly $1,183.50, per day $34.99   Extended Stay [place]... 2-star hotel $60 Extended Stay [place]. - [address] 3.2  2-star hotel Unpretentious lodging offering studio suites with kitchenettes, plus free breakfast & Wi-Fi. Free Wi-Fi Free breakfast [place], [place] [address] [phone] weekly $59.99 and monthly rate   $33 [facility] [place] 3.0  2-star hotel Free Wi-Fi DEAL 46% less than usual [address] [place], [place] [address] Phone:([phone] weekly only  $304.00 no availability today   Studio 6 2-star hotel $50 [place] 6 3.6 2-star Relaxed budget hotel offering modest rooms with kitchenettes, plus an outdoor pool &  a hot tub. Address: [address] [place], [place] [address] Phone:([phone] weekly card [id number] --cash [phone] queen and bi weekly   [facility] [place] hotel $45 [place]  3.0 2-star Extended-stay hotel offering modest rooms with kitchens, plus a coin laundry & free Wi-Fi. Free Wi-Fi [place], [place], [place] [address] [phone] weekly $298.14 No rooms today    $51 Arlyce Dice [place] 3.3  3-star hotel Contemporary lodging with free Wi-Fi & breakfast, plus a fitness center & an outdoor pool. Free Wi-Fi Free breakfast [address], [place], [place] [address] [phone] nightly rate but will let you stay for 5 days in 1 bed $304.45   $65 [facility] 3.5 2-star Simple budget airport hotel offering free continental breakfast, WiFi & parking. Free Wi-Fi Free breakfast [place], [place], [place] [address] [phone] weekly  only $350 without tax   $52 [place] 3.4 2-star Casual rooms, some with microwaves & minifridges, plus free breakfast in a functional budget hotel.  Free Wi-Fi Free breakfast [address] [place], [place] [address] Phone:(336) [phone] daily rates onlys    $29 [facility] [place] 2.5 2-star Extended-stay hotel offering [employer] with Wi-Fi, plus a laundry room & free parking. Free Wi-Fi DEAL 29% less than usual [address] [place],  [address] Phone:(336) [phone] weekly rates no answer, You have to leave a voice message   $67 [facility] 3.5 2-star Casual property with simple quarters, some with fireplaces & whirlpool tubs,  plus free breakfast. Free Wi-Fi Free breakfast [address] [place], [place] [address] Phone:(336) [phone] weekly only  small cruise room cost for weekly rate $256.78  $62 [facility] 4.0 2-star $78 [place] 4.0  2-star hotel Straightforward hotel offering low-key rooms & suites, a seasonal pool & a gym, plus free  breakfast.  Free Wi-Fi Free breakfast  [place], [place], [place] [address] Phone:(336) [phone] DEAL 21% off weekly rate $78.91 after tax No monthly rate

## 2014-12-05 NOTE — Hospital Discharge Follow-Up (Signed)
Met with the patient and Joellyn Quails, CM to discuss the patient's plans for discharge follow up.  Explained the services provided by at the [facility] and the [facility], including social work. Reminded the patient of her appointment at the [facility] on 12/11/14@ 0945. Explained to her that she can still keep her PCP.   She said that she has all of her medications with her but her O2 is currently in a storage facility in [place],   Discussed the options for housing after discharge.  The patient is currently homeless and has been involved with the [facility]. She said that she will continue to call the [facility] daily to inquire about bed availability at the shelters in [place] and [place]; but she prefers to go to [employer]. Discussed the options of extended stay motels. Learta Codding, to provide her with a list of extended stay options.
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
## 2014-12-05 NOTE — ED Provider Notes (Signed)
10:16 AM BP 114/61 mmHg  Pulse 81  Temp(Src) 98.9 F (37.2 C) (Oral)  Resp 20  SpO2 94%  LMP 03/24/2009 Patient taken in sign out from [place]. 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 She has a primary care physician. Her previous hospital notes states that she needs a new sleep study and should have a BiPAP at home. Case manager Joellyn Quails has seen the patient. Patient admits that she has much of these breathing materials in a storage facility. She is currently homeless. Patient states 2R's case manager th she gets welfare check on the first of the month. However, she is on a 1 out of her money and really needs a place to stay for the next 2 weeks until she is pain began.  We are unable to order BiPAP from the emergency department. However, patient does have a primary care physician to which she can go to and get a BiPAP machine ordered. She is set up for sleep study. Currently awaiting social work consult.   12:24 PM BP 114/61 mmHg  Pulse 81  Temp(Src) 98.9 F (37.2 C) (Oral)  Resp 20  SpO2 94%  LMP 03/24/2009  Patient seen by social work. She has a list of places to stay in [place] and local shelters. Patient expressed to Care manager that she would "just keep coming back to these hospitals" until she gets admitted. Ms.Gibbs has flagged the chart. Appears safe for discharge at this time.  Margarita Mail, PA-C 12/05/14 [place], MD 12/05/14 1537

## 2014-12-05 NOTE — ED Notes (Signed)
Pt ambulatory short distances, mainly in use of wheelchair. She is a&ox4 and denied questions r/t dc

## 2014-12-07 ENCOUNTER — Encounter: Payer: Medicaid Other | Admitting: Obstetrics & Gynecology

## 2014-12-08 ENCOUNTER — Telehealth: Payer: Self-pay

## 2014-12-08 NOTE — Telephone Encounter (Signed)
This Case Manager placed call to patient to check on status, to determine if she is being compliant with medications, and to remind her of her appointment on 12/11/14 at [facility] with Dr. Jarold Song.  Unable to reach patient at [phone] or [phone]. Left HIPPA compliant voicemails requesting return call.

## 2014-12-08 NOTE — Telephone Encounter (Signed)
This Case Manager placed additional call to patient to check on status and to remind of appointment on 9/919/16 at [facility] with Dr. Jarold Song.  Unable to reach patient once again; left voicemail requesting return call.

## 2014-12-11 ENCOUNTER — Inpatient Hospital Stay: Payer: Medicaid Other | Admitting: Family Medicine

## 2014-12-11 ENCOUNTER — Emergency Department (HOSPITAL_COMMUNITY)
Admission: EM | Admit: 2014-12-11 | Discharge: 2014-12-12 | Disposition: A | Discharge status: Home / Self Care | Payer: Medicaid Other | Attending: Emergency Medicine | Admitting: Emergency Medicine

## 2014-12-11 DIAGNOSIS — R0600 Dyspnea, unspecified: Secondary | ICD-10-CM

## 2014-12-12 ENCOUNTER — Encounter (HOSPITAL_COMMUNITY): Payer: Self-pay

## 2014-12-12 ENCOUNTER — Emergency Department (HOSPITAL_COMMUNITY): Payer: Medicaid Other

## 2014-12-12 ENCOUNTER — Telehealth: Payer: Self-pay

## 2014-12-12 DIAGNOSIS — I509 Heart failure, unspecified: Secondary | ICD-10-CM | POA: Diagnosis not present

## 2014-12-12 DIAGNOSIS — K219 Gastro-esophageal reflux disease without esophagitis: Secondary | ICD-10-CM | POA: Diagnosis not present

## 2014-12-12 DIAGNOSIS — Z87448 Personal history of other diseases of urinary system: Secondary | ICD-10-CM | POA: Diagnosis not present

## 2014-12-12 DIAGNOSIS — R079 Chest pain, unspecified: Secondary | ICD-10-CM | POA: Diagnosis present

## 2014-12-12 DIAGNOSIS — Q828 Other specified congenital malformations of skin: Secondary | ICD-10-CM | POA: Diagnosis not present

## 2014-12-12 DIAGNOSIS — Z9981 Dependence on supplemental oxygen: Secondary | ICD-10-CM | POA: Diagnosis not present

## 2014-12-12 DIAGNOSIS — Z79899 Other long term (current) drug therapy: Secondary | ICD-10-CM | POA: Insufficient documentation

## 2014-12-12 DIAGNOSIS — R2233 Localized swelling, mass and lump, upper limb, bilateral: Secondary | ICD-10-CM | POA: Diagnosis not present

## 2014-12-12 DIAGNOSIS — Z872 Personal history of diseases of the skin and subcutaneous tissue: Secondary | ICD-10-CM | POA: Insufficient documentation

## 2014-12-12 DIAGNOSIS — G4733 Obstructive sleep apnea (adult) (pediatric): Secondary | ICD-10-CM | POA: Insufficient documentation

## 2014-12-12 DIAGNOSIS — Z8614 Personal history of Methicillin resistant Staphylococcus aureus infection: Secondary | ICD-10-CM | POA: Insufficient documentation

## 2014-12-12 DIAGNOSIS — R2243 Localized swelling, mass and lump, lower limb, bilateral: Secondary | ICD-10-CM | POA: Insufficient documentation

## 2014-12-12 DIAGNOSIS — I10 Essential (primary) hypertension: Secondary | ICD-10-CM | POA: Insufficient documentation

## 2014-12-12 DIAGNOSIS — R0789 Other chest pain: Secondary | ICD-10-CM | POA: Diagnosis not present

## 2014-12-12 DIAGNOSIS — J45901 Unspecified asthma with (acute) exacerbation: Secondary | ICD-10-CM | POA: Diagnosis not present

## 2014-12-12 DIAGNOSIS — Z7982 Long term (current) use of aspirin: Secondary | ICD-10-CM | POA: Insufficient documentation

## 2014-12-12 DIAGNOSIS — G8929 Other chronic pain: Secondary | ICD-10-CM | POA: Insufficient documentation

## 2014-12-12 DIAGNOSIS — Z950 Presence of cardiac pacemaker: Secondary | ICD-10-CM | POA: Diagnosis not present

## 2014-12-12 DIAGNOSIS — E785 Hyperlipidemia, unspecified: Secondary | ICD-10-CM | POA: Diagnosis not present

## 2014-12-12 DIAGNOSIS — Z7951 Long term (current) use of inhaled steroids: Secondary | ICD-10-CM | POA: Diagnosis not present

## 2014-12-12 DIAGNOSIS — D72829 Elevated white blood cell count, unspecified: Secondary | ICD-10-CM | POA: Diagnosis not present

## 2014-12-12 DIAGNOSIS — M1990 Unspecified osteoarthritis, unspecified site: Secondary | ICD-10-CM | POA: Diagnosis not present

## 2014-12-12 DIAGNOSIS — Z87891 Personal history of nicotine dependence: Secondary | ICD-10-CM | POA: Diagnosis not present

## 2014-12-12 MED ORDER — PREDNISONE 20 MG PO TABS
60.0000 mg | ORAL_TABLET | Freq: Once | ORAL | Status: AC
  Administered 2014-12-12: 60 mg via ORAL
  Filled 2014-12-12: qty 3

## 2014-12-12 MED ORDER — IPRATROPIUM-ALBUTEROL 0.5-2.5 (3) MG/3ML IN SOLN
3.0000 mL | Freq: Once | RESPIRATORY_TRACT | Status: AC
  Administered 2014-12-12: 3 mL via RESPIRATORY_TRACT
  Filled 2014-12-12: qty 3

## 2014-12-12 NOTE — ED Notes (Signed)
Onset 2-3 days swelling in arms and leg, shortness of breath/wheezing- using inhalers with no relief, lateral right abd pain.  Pt in motorized wheelchair.  Talking in complete sentences.

## 2014-12-12 NOTE — Discharge Instructions (Signed)

## 2014-12-12 NOTE — ED Provider Notes (Signed)
CSN: [id number]     Arrival date & time 12/11/14  2348 History   First MD Initiated Contact with Patient 12/12/14 0244     Chief Complaint  Patient presents with  . Shortness of Breath     HPI Pt presents for exacerbation of her asthma. Reports SOB. Received an albuterol treatment in the ER prior to my evaluation. Reports productive cough. No rectal bleeding. No fevers or chills. No other complaints at this time. Currently without a place to stay until she receives money on October 1st. When she receives her disability check she stays in hotels. Previously was in section 8 housing but was evicted. Currently working on a new place to stay. Missed her PCP appt on 12/11/14 (yesterday)  Past Medical History  Diagnosis Date  . Asthma   . GERD (gastroesophageal reflux disease)   . Hypertension   . Homelessness   . Low back pain   . 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 Arthritis   . Gout   . Hyperlipemia   . Morbid obesity     uses motor wheel chair  . MRSA (methicillin resistant Staphylococcus aureus)     states about a year ago  . CHF (congestive heart failure)   . Chronic abdominal pain   . Hidradenitis   . Chronic pain     "all over"  . Renal insufficiency   . On home O2     3L N/C O2 continuously  . OSA (obstructive sleep apnea)     non-compliant with CPAP   Past Surgical History  Procedure Laterality Date  . Tubal ligation    . Cesarean section     Family History  Problem Relation Age of Onset  . Asthma Mother   . Diabetes Father   . Heart disease Father   . Stroke Father    Social History  Substance Use Topics  . Smoking status: Former Smoker    Types: Cigarettes    Quit date: 03/25/2003  . Smokeless tobacco: Former Systems developer    Quit date: 05/27/1978  . Alcohol Use: No   OB History    No data available     Review of Systems  All other systems reviewed and are negative.     Allergies  Ibuprofen; Adhesive; Azithromycin; Doxycycline;  Penicillins; Sulfa antibiotics; and Sulfamethoxazole-trimethoprim  Home Medications   Prior to Admission medications   Medication Sig Start Date End Date Taking? Authorizing Provider  albuterol-ipratropium (COMBIVENT) 18-103 MCG/ACT inhaler Inhale 2 puffs into the lungs every 4 (four) hours as needed for wheezing or shortness of breath. 11/21/14  Yes Debbe Odea, MD  allopurinol (ZYLOPRIM) 100 MG tablet Take 1 tablet (100 mg total) by mouth every morning. 01/22/14  Yes Donne Hazel, MD  ALPRAZolam Duanne Moron) 1 MG tablet Take 1 mg by mouth at bedtime as needed for anxiety (anxiety).   Yes Historical Provider, MD  ammonium lactate (AMLACTIN) 12 % cream Apply 1 g topically daily as needed for dry skin. 04/22/14  Yes Linton Flemings, MD  aspirin 325 MG tablet Take 325 mg by mouth every morning.    Yes Historical Provider, MD  atorvastatin (LIPITOR) 20 MG tablet Take 1 tablet (20 mg total) by mouth every morning. 01/22/14  Yes Donne Hazel, MD  benzonatate (TESSALON) 100 MG capsule Take 1 capsule (100 mg total) by mouth every 8 (eight) hours. Patient taking differently: Take 100 mg by mouth 3 (three) times daily as needed for  cough.  12/01/14  Yes April Palumbo, MD  budesonide-formoterol [facility]) 160-4.5 MCG/ACT inhaler Inhale 2 puffs into the lungs 2 (two) times daily. 11/21/14  Yes Debbe Odea, MD  cloNIDine (CATAPRES) 0.2 MG tablet Take 0.2 mg by mouth 2 (two) times daily.   Yes Historical Provider, MD  clotrimazole (LOTRIMIN) 1 % cream Apply to affected area 2 times daily Patient taking differently: Apply 1 application topically 2 (two) times daily.  06/20/14  Yes Charlann Lange, PA-C  diphenhydrAMINE (BENADRYL) 25 mg capsule Take 25 mg by mouth every 6 (six) hours as needed for itching or allergies (allergies).    Yes Historical Provider, MD  Emollient (EUCERIN) lotion Apply 5 mLs topically 2 (two) times daily as needed for dry skin. Patient taking differently: Apply 5 mLs topically 3 (three) times  daily.  04/22/14  Yes Linton Flemings, MD  folic acid (FOLVITE) 1 MG tablet Take 1 tablet (1 mg total) by mouth daily. 01/22/14  Yes Donne Hazel, MD  furosemide (LASIX) 40 MG tablet Take 1 tablet (40 mg total) by mouth 2 (two) times daily. 01/22/14  Yes Donne Hazel, MD  hydrOXYzine (ATARAX/VISTARIL) 25 MG tablet Take 25 mg by mouth 3 (three) times daily as needed for anxiety or itching (anxiety and itching).    Yes Historical Provider, MD  ketoconazole (NIZORAL) 2 % shampoo Apply 1 application topically daily as needed for irritation (irritation).    Yes Historical Provider, MD  ketoconazole (NIZORAL) 200 MG tablet Take 200 mg by mouth every morning.    Yes Historical Provider, MD  lisinopril (PRINIVIL,ZESTRIL) 40 MG tablet Take 40 mg by mouth daily.   Yes Historical Provider, MD  Multiple Vitamin (MULTIVITAMIN WITH MINERALS) TABS tablet Take 1 tablet by mouth daily. 01/22/14  Yes Donne Hazel, MD  pantoprazole (PROTONIX) 40 MG tablet Take 1 tablet (40 mg total) by mouth every morning. 01/22/14  Yes Donne Hazel, MD  predniSONE (DELTASONE) 20 MG tablet Take 1 tablet (20 mg total) by mouth daily with breakfast. 11/21/14  Yes Debbe Odea, MD  solifenacin (VESICARE) 5 MG tablet Take 5 mg by mouth every morning.    Yes Historical Provider, MD  vitamin B-12 (CYANOCOBALAMIN) 1000 MCG tablet Take 1 tablet (1,000 mcg total) by mouth every morning. 01/22/14  Yes Donne Hazel, MD  vitamin E 100 UNIT capsule Take 1 capsule (100 Units total) by mouth every morning. 01/22/14  Yes Donne Hazel, MD  guaiFENesin (MUCINEX) 600 MG 12 hr tablet Take 1 tablet (600 mg total) by mouth 2 (two) times daily. Patient not taking: Reported on 12/12/2014 11/21/14   Debbe Odea, MD   BP 152/85 mmHg  Pulse 91  Temp(Src) 98.9 F (37.2 C) (Oral)  Resp 18  SpO2 97%  LMP 03/24/2009 Physical Exam  Constitutional: She is oriented to person, place, and time. She appears well-developed and well-nourished. No distress.  HENT:   Head: Normocephalic and atraumatic.  Eyes: EOM are normal.  Neck: Normal range of motion.  Cardiovascular: Normal rate, regular rhythm and normal heart sounds.   Pulmonary/Chest: Effort normal and breath sounds normal. She has no wheezes.  Abdominal: Soft. She exhibits no distension. There is no tenderness.  Musculoskeletal: Normal range of motion.  Neurological: She is alert and oriented to person, place, and time.  Skin: Skin is warm and dry.  Psychiatric: She has a normal mood and affect. Judgment normal.  Nursing note and vitals reviewed.   ED Course  Procedures (including critical care  time) Labs Review Labs Reviewed - No data to display  Imaging Review Dg Chest 2 View  12/12/2014   CLINICAL DATA:  Continue shortness of breath not improving with inhaler ; former smoker, asthma, hypertension, CHF  EXAM: CHEST  2 VIEW  COMPARISON:  12/01/2014  FINDINGS: Enlargement of cardiac silhouette with pulmonary vascular congestion.  Minimal perihilar infra compatible pulmonary edema.  Underpenetration at bases.  No gross effusion or pneumothorax.  IMPRESSION: Mild CHF.   Electronically Signed   By: Lavonia Dana M.D.   On: 12/12/2014 01:47   I have personally reviewed and evaluated these images and lab results as part of my medical decision-making.   EKG Interpretation None      MDM   Final diagnoses:  Dyspnea    Vitals normal. Feels better after breathing treatment. Single dose of prednisone. Suspect bronchitis. No PNA. Has outpatient resources.     Jola Schmidt, MD 12/12/14 [phone]

## 2014-12-12 NOTE — ED Notes (Signed)
Pt complains of being short of breath for the last few days, she has been using her inhaler but doesn't feel any better

## 2014-12-12 NOTE — ED Notes (Signed)
Pt declines breathing treatment in triage.  Wants to see MD first.

## 2014-12-12 NOTE — Telephone Encounter (Signed)
Attempted to contact the patient to check on her status. She missed her appointment with Dr Jarold Song at [facility] yesterday, 12/11/14.  Calls placed to # [phone] and [phone] and the message on both numbers state that the person is not accepting calls at this time.

## 2014-12-12 NOTE — ED Notes (Signed)
Patient ambulated unassisted with assistive device from home.

## 2014-12-12 NOTE — ED Notes (Addendum)
Patient reports feeling "some better, but not a lot". Patient was questioned if she uses home O2, states "I am supposed to", reports she is supposed to use 3LNC. Patient was side lying, eyes closed, chest observed for rise and fall, NAD noted when this nurse entered the room.

## 2014-12-13 ENCOUNTER — Emergency Department (HOSPITAL_COMMUNITY): Payer: Medicaid Other

## 2014-12-13 ENCOUNTER — Emergency Department (EMERGENCY_DEPARTMENT_HOSPITAL)
Admission: EM | Admit: 2014-12-13 | Discharge: 2014-12-13 | Disposition: A | Discharge status: Home / Self Care | Payer: Medicaid Other | Source: Home / Self Care | Attending: Emergency Medicine | Admitting: Emergency Medicine

## 2014-12-13 DIAGNOSIS — D72829 Elevated white blood cell count, unspecified: Secondary | ICD-10-CM | POA: Insufficient documentation

## 2014-12-13 DIAGNOSIS — Q828 Other specified congenital malformations of skin: Secondary | ICD-10-CM | POA: Diagnosis not present

## 2014-12-13 DIAGNOSIS — R0789 Other chest pain: Secondary | ICD-10-CM

## 2014-12-13 LAB — CBC WITH DIFFERENTIAL/PLATELET
BASOS ABS: 0 10*3/uL (ref 0.0–0.1)
BASOS PCT: 0 %
Eosinophils Absolute: 0.1 10*3/uL (ref 0.0–0.7)
Eosinophils Relative: 1 %
HCT: 40.1 % (ref 36.0–46.0)
Hemoglobin: 11.8 g/dL — ABNORMAL LOW (ref 12.0–15.0)
LYMPHS PCT: 22 %
Lymphs Abs: 3.3 10*3/uL (ref 0.7–4.0)
MCH: 28.1 pg (ref 26.0–34.0)
MCHC: 29.4 g/dL — AB (ref 30.0–36.0)
MCV: 95.5 fL (ref 78.0–100.0)
Monocytes Absolute: 0.7 10*3/uL (ref 0.1–1.0)
Monocytes Relative: 5 %
NEUTROS ABS: 10.9 10*3/uL — AB (ref 1.7–7.7)
Neutrophils Relative %: 72 %
Platelets: 229 10*3/uL (ref 150–400)
RBC: 4.2 MIL/uL (ref 3.87–5.11)
RDW: 15.8 % — ABNORMAL HIGH (ref 11.5–15.5)
WBC: 15.1 10*3/uL — AB (ref 4.0–10.5)

## 2014-12-13 LAB — I-STAT TROPONIN, ED
Troponin i, poc: 0 ng/mL (ref 0.00–0.08)
Troponin i, poc: 0 ng/mL (ref 0.00–0.08)

## 2014-12-13 LAB — COMPREHENSIVE METABOLIC PANEL
ALK PHOS: 50 U/L (ref 38–126)
ALT: 17 U/L (ref 14–54)
ANION GAP: 9 (ref 5–15)
AST: 18 U/L (ref 15–41)
Albumin: 3.3 g/dL — ABNORMAL LOW (ref 3.5–5.0)
BUN: 30 mg/dL — ABNORMAL HIGH (ref 6–20)
CALCIUM: 8.3 mg/dL — AB (ref 8.9–10.3)
CO2: 31 mmol/L (ref 22–32)
Chloride: 98 mmol/L — ABNORMAL LOW (ref 101–111)
Creatinine, Ser: 1.25 mg/dL — ABNORMAL HIGH (ref 0.44–1.00)
GFR calc non Af Amer: 49 mL/min — ABNORMAL LOW (ref >60)
GFR, EST AFRICAN AMERICAN: 57 mL/min — AB (ref >60)
Glucose, Bld: 110 mg/dL — ABNORMAL HIGH (ref 65–99)
Potassium: 4.3 mmol/L (ref 3.5–5.1)
SODIUM: 138 mmol/L (ref 135–145)
TOTAL PROTEIN: 6.2 g/dL — AB (ref 6.5–8.1)
Total Bilirubin: 0.6 mg/dL (ref 0.3–1.2)

## 2014-12-13 LAB — TROPONIN I
TROPONIN I: 0.08 ng/mL — AB (ref <0.031)
TROPONIN I: 0.03 ng/mL (ref <0.031)

## 2014-12-13 LAB — BRAIN NATRIURETIC PEPTIDE: B NATRIURETIC PEPTIDE 5: 35.5 pg/mL (ref 0.0–100.0)

## 2014-12-13 MED ORDER — FENTANYL CITRATE (PF) 100 MCG/2ML IJ SOLN
50.0000 ug | Freq: Once | INTRAMUSCULAR | Status: AC
  Administered 2014-12-13: 50 ug via INTRAVENOUS
  Filled 2014-12-13: qty 2

## 2014-12-13 MED ORDER — ASPIRIN 81 MG PO CHEW
324.0000 mg | CHEWABLE_TABLET | Freq: Once | ORAL | Status: AC
  Administered 2014-12-13: 324 mg via ORAL
  Filled 2014-12-13: qty 4

## 2014-12-13 MED ORDER — NITROGLYCERIN 0.4 MG SL SUBL: 0.4000 mg | SUBLINGUAL_TABLET | SUBLINGUAL | Status: DC | PRN

## 2014-12-13 MED ORDER — FUROSEMIDE 10 MG/ML IJ SOLN
40.0000 mg | Freq: Once | INTRAMUSCULAR | Status: AC
  Administered 2014-12-13: 40 mg via INTRAVENOUS
  Filled 2014-12-13: qty 4

## 2014-12-13 MED ORDER — IPRATROPIUM-ALBUTEROL 0.5-2.5 (3) MG/3ML IN SOLN
3.0000 mL | Freq: Once | RESPIRATORY_TRACT | Status: AC
  Administered 2014-12-13: 3 mL via RESPIRATORY_TRACT
  Filled 2014-12-13: qty 3

## 2014-12-13 NOTE — Consult Note (Signed)
Cardiology Consultation     Patient ID: Kara Vincent, [id number], 1965/12/24  Date of Consult: 9/21//2016  Primary Cardiologist: none Referring Physician: Dr. Leonides Schanz  Chief Complaint:  [name] chest pain   HPI: [name] is a 49 year old female who has a history of morbid obesity, COPD, chronic Darier disease, who is homeless and predominantly wheelchair-bound.  She recently was treated with a short course of prednisone for possible COPD exacerbation.  She has had recurrent emergency room evaluations for sharp chest pain.  Review of her records indicates that an echo in June 2015 showed normal systolic function without regional wall motion abnormalities.  A repeat echo in August 2016 was unchanged and showed an ejection fraction of 60-65%.  She had mild pulmonary hypertension.  In August 2016 a d-dimer was minimally increased at 1.55 and a CT Angio of her chest was negative.  A VQ scan recurrent sharp chest pain was done on December 01, 2014 was low probability. She is homeless at times stays in a motel or another times on the street in her wheelchair.  She only walks minimally, but denies any exercise-induced chest pressure.  The last several days she has had intermittent cough and she's notices a sharp fleeting chest discomfort with coughing.  She denies any constant pain.  In the middle the night.  She presented to the emergency room for evaluation.  Her ECG is entirely normal.  Troponin was 0.08 and repeat was 0.00.  Cardiology evaluation was requested.    Past Medical History  Diagnosis Date  . Asthma   . GERD (gastroesophageal reflux disease)   . Hypertension   . Homelessness   . Low back pain   . 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 Arthritis   . Gout   . Hyperlipemia   . Morbid obesity     uses motor wheel chair  . MRSA (methicillin resistant Staphylococcus aureus)     states about a year ago  . CHF (congestive heart failure)   . Chronic abdominal  pain   . Hidradenitis   . Chronic pain     "all over"  . Renal insufficiency   . On home O2     3L N/C O2 continuously  . OSA (obstructive sleep apnea)     non-compliant with CPAP   Past Surgical History  Procedure Laterality Date  . Tubal ligation    . Cesarean section      FAMHx: Family History  Problem Relation Age of Onset  . Asthma Mother   . Diabetes Father   . Heart disease Father   . Stroke Father     SOCHx:  reports that she quit smoking about 11 years ago. Her smoking use included Cigarettes. She quit smokeless tobacco use about 36 years ago. She reports that she does not drink alcohol or use illicit drugs.  ALLERGIES: Allergies  Allergen Reactions  . Ibuprofen Hives, Shortness Of Breath and Palpitations  . Adhesive [Tape] Rash  . Azithromycin Hives and Rash  . Doxycycline Rash  . Penicillins Rash    Has patient had a PCN reaction causing immediate rash, facial/tongue/throat swelling, SOB or lightheadedness with hypotension:Yes Has patient had a PCN reaction causing severe rash involving mucus membranes or skin necrosis:Yes Has patient had a PCN reaction that required hospitalization Yes Has patient had a PCN reaction occurring within the last 10 years:No If all of the above answers are "NO", then may proceed with Cephalosporin use.   [name]  Sulfa Antibiotics Rash  . Sulfamethoxazole-Trimethoprim Rash     HOME MEDICATIONS:  (Not in a hospital admission)  HOSPITAL MEDICATIONS:    ROS General: Negative; No fevers, chills, or night sweats;  HEENT: Negative; No changes in vision or hearing, sinus congestion, difficulty swallowing Pulmonary: Positive for occasional coughing.  No wheezing, shortness of breath, hemoptysis Cardiovascular: Negative for exertional chest pain.  No PND, orthopnea.  Positive for mild edema GI: Negative; No nausea, vomiting, diarrhea, or abdominal pain GU: Negative; No dysuria, hematuria, or difficulty voiding Musculoskeletal:  Negative; no myalgias, joint pain, or weakness Hematologic/Oncology: Negative; no easy bruising, bleeding Endocrine: Negative; no heat/cold intolerance; no diabetes Neuro: Negative; no changes in balance, headaches Skin: Positive for rash Psychiatric: Negative; No behavioral problems, depression Sleep: Negative; No snoring, daytime sleepiness, hypersomnolence, bruxism, restless legs, hypnogognic hallucinations, no cataplexy Other comprehensive 14 point system review is negative.   VITALS:  Blood pressure 109/82, pulse 88, temperature 98 F (36.7 C), temperature source Oral, resp. rate 16, last menstrual period 03/24/2009, SpO2 89 %.  PHYSICAL EXAM: General appearance: Morbidly obese, appears comfortable in no distress Neck: no adenopathy, no carotid bruit, no JVD, supple, symmetrical, trachea midline, thyroid not enlarged, symmetric, no tenderness/mass/nodules and Thick neck.  No JVD.  No carotid bruits Lungs: Minimally decreased breath sounds at the bases.  No wheezing.  No rhonchi. Heart: Regular rate and rhythm at 80 bpm.  Rare isolated PVC.  No S3 or S4 gallop.  No rubs thrills or heaves. Abdomen: Morbidly obese with significant central or possibly.  Bowel sounds positive; nontender Extremities: Negative Homans sign.  No tenderness.  Mild ankle edema Skin: Skin color, texture, turgor normal. No rashes or lesions or Diffuse rash over the body which is old, consistent with chronic Darier disease Neurologic: Grossly normal  ECG (independently read by me): Normal sinus rhythm at 88 with mild sinus arrhythmia and one isolated PVC.  No ST segment changes.  LABS: Results for orders placed or performed during the hospital encounter of 12/13/14 (from the past 48 hour(s))  CBC with Differential     Status: Abnormal   Collection Time: 12/13/14  2:07 AM  Result Value Ref Range   WBC 15.1 (H) 4.0 - 10.5 K/uL   RBC 4.20 3.87 - 5.11 MIL/uL   Hemoglobin 11.8 (L) 12.0 - 15.0 g/dL   HCT 40.1  36.0 - 46.0 %   MCV 95.5 78.0 - 100.0 fL   MCH 28.1 26.0 - 34.0 pg   MCHC 29.4 (L) 30.0 - 36.0 g/dL   RDW 15.8 (H) 11.5 - 15.5 %   Platelets 229 150 - 400 K/uL   Neutrophils Relative % 72 %   Neutro Abs 10.9 (H) 1.7 - 7.7 K/uL   Lymphocytes Relative 22 %   Lymphs Abs 3.3 0.7 - 4.0 K/uL   Monocytes Relative 5 %   Monocytes Absolute 0.7 0.1 - 1.0 K/uL   Eosinophils Relative 1 %   Eosinophils Absolute 0.1 0.0 - 0.7 K/uL   Basophils Relative 0 %   Basophils Absolute 0.0 0.0 - 0.1 K/uL  Comprehensive metabolic panel     Status: Abnormal   Collection Time: 12/13/14  2:07 AM  Result Value Ref Range   Sodium 138 135 - 145 mmol/L   Potassium 4.3 3.5 - 5.1 mmol/L   Chloride 98 (L) 101 - 111 mmol/L   CO2 31 22 - 32 mmol/L   Glucose, Bld 110 (H) 65 - 99 mg/dL   BUN 30 (H) 6 -  20 mg/dL   Creatinine, Ser 1.25 (H) 0.44 - 1.00 mg/dL   Calcium 8.3 (L) 8.9 - 10.3 mg/dL   Total Protein 6.2 (L) 6.5 - 8.1 g/dL   Albumin 3.3 (L) 3.5 - 5.0 g/dL   AST 18 15 - 41 U/L   ALT 17 14 - 54 U/L   Alkaline Phosphatase 50 38 - 126 U/L   Total Bilirubin 0.6 0.3 - 1.2 mg/dL   GFR calc non Af Amer 49 (L) >60 mL/min   GFR calc Af Amer 57 (L) >60 mL/min    Comment: (NOTE) The eGFR has been calculated using the CKD EPI equation. This calculation has not been validated in all clinical situations. eGFR's persistently <60 mL/min signify possible Chronic Kidney Disease.    Anion gap 9 5 - 15  Brain natriuretic peptide     Status: None   Collection Time: 12/13/14  2:07 AM  Result Value Ref Range   B Natriuretic Peptide 35.5 0.0 - 100.0 pg/mL  I-stat troponin, ED     Status: None   Collection Time: 12/13/14  2:11 AM  Result Value Ref Range   Troponin i, poc 0.00 0.00 - 0.08 ng/mL   Comment 3            Comment: Due to the release kinetics of cTnI, a negative result within the first hours of the onset of symptoms does not rule out myocardial infarction with certainty. If myocardial infarction is still  suspected, repeat the test at appropriate intervals.   Troponin I     Status: Abnormal   Collection Time: 12/13/14  6:00 AM  Result Value Ref Range   Troponin I 0.08 (H) <0.031 ng/mL    Comment:        PERSISTENTLY INCREASED TROPONIN VALUES IN THE RANGE OF 0.04-0.49 ng/mL CAN BE SEEN IN:       -UNSTABLE ANGINA       -CONGESTIVE HEART FAILURE       -MYOCARDITIS       -CHEST TRAUMA       -ARRYHTHMIAS       -LATE PRESENTING MYOCARDIAL INFARCTION       -COPD   CLINICAL FOLLOW-UP RECOMMENDED.   I-stat troponin, ED     Status: None   Collection Time: 12/13/14  8:24 AM  Result Value Ref Range   Troponin i, poc 0.00 0.00 - 0.08 ng/mL   Comment 3            Comment: Due to the release kinetics of cTnI, a negative result within the first hours of the onset of symptoms does not rule out myocardial infarction with certainty. If myocardial infarction is still suspected, repeat the test at appropriate intervals.     IMAGING: Dg Chest 2 View  12/13/2014   CLINICAL DATA:  Shortness of breath tonight, right-sided chest pain, leg swelling, asthma, GERD, CHF  EXAM: CHEST  2 VIEW  COMPARISON:  12/12/2014  FINDINGS: Enlargement of cardiac silhouette.  Vascular congestion.  Very low lung volumes.  Underpenetration of lung bases particularly LEFT lower lobe.  Improved perihilar infiltrates versus previous exam.  No gross pneumothorax or pleural effusion.  IMPRESSION: Improved perihilar infiltrate/edema.  Enlargement of cardiac silhouette.   Electronically Signed   By: Lavonia Dana M.D.   On: 12/13/2014 03:11   Dg Chest 2 View  12/12/2014   CLINICAL DATA:  Continue shortness of breath not improving with inhaler ; former smoker, asthma, hypertension, CHF  EXAM: CHEST  2 VIEW  COMPARISON:  12/01/2014  FINDINGS: Enlargement of cardiac silhouette with pulmonary vascular congestion.  Minimal perihilar infra compatible pulmonary edema.  Underpenetration at bases.  No gross effusion or pneumothorax.   IMPRESSION: Mild CHF.   Electronically Signed   By: Lavonia Dana M.D.   On: 12/12/2014 01:47    IMPRESSION: 1.  Atypical chest pain: Her chest pain syndrome, sounds noncardiac.  She denies any exertional precipitation.  Her pain is very sharp and fleeting and seems to occur when she coughs.  Her ECG is unremarkable.  Although one troponin was 0.08, the other troponin was 0.  I do not believe this is an acute coronary syndrome.  A follow-up troponin level will be drawn.  Suspect chest wall discomfort.  She previously has been evaluated for PE and has had negative CT angio of chest and V/Q scans.  No signs of CHF.  BNP normal.  Previous echo Doppler study has confirmed normal to hyperdynamic LV function without focal segmental wall motion abnormalities. 2.  Morbid obesity 3.  COPD 4.  Darier disease skin abnormality 5.  Leukocytosis, probably contributed by recent prednisone use  Doubt cardiac etiology to her chest pain.  A third troponin will be sent.  Do not feel cardiology admission is indicated.  However, if other factors lead to need for observation or admission recommend hospitalist service.   Troy Sine, MD, [facility] 12/13/2014 9:32 AM

## 2014-12-13 NOTE — ED Notes (Signed)
Patient transported to X-ray 

## 2014-12-13 NOTE — ED Notes (Signed)
Dr. Ward at bedside at this time.  

## 2014-12-13 NOTE — ED Provider Notes (Signed)
This chart was scribed for [place], DO by Kara Vincent, ED Scribe. This patient was seen in room A03C/A03C and the patient's care was started 1:27 AM.   TIME SEEN: 1:27 AM   CHIEF COMPLAINT:  Chief Complaint  Patient presents with  . Asthma  . Abdominal Pain     HPI:  HPI Comments: Kara Vincent is a 49 y.o. female with a PMHx of HTN, asthma,  who presents to the Emergency Department complaining of constant, ongoing substernal chest pain x 2-3 days. Discomfort is described as sharp, pressure. Pain is exacerbated with deep breathing and mildly alleviated at rest. Associated worsening shortness of breath, swelling to arms and legs bilaterally, cough, and wheezing also reported. Shortness of breath is made worsen with exertion. No OTC/prescribed medications attempted prior to arrival for symptoms. Denies attempting any breathing treatments at home. No recent fever, chills, nausea, vomiting, or weakness. Kara Vincent states she is supposed to be on 3 liters of Oxygen at home but no longer has her Oxygen equipment.  PCP: York Ram MD  ROS: See HPI Constitutional: no fever  Eyes: no drainage  ENT: no runny nose   Cardiovascular:  Positive chest pain and leg swelling  Resp: Positive SOB  GI: no vomiting GU: no dysuria Integumentary: no rash  Allergy: no hives  Musculoskeletal: no leg swelling  Neurological: no slurred speech ROS otherwise negative  PAST MEDICAL HISTORY/PAST SURGICAL HISTORY:  Past Medical History  Diagnosis Date  . Asthma   . GERD (gastroesophageal reflux disease)   . Hypertension   . Homelessness   . Low back pain   . 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 Arthritis   . Gout   . Hyperlipemia   . Morbid obesity     uses motor wheel chair  . MRSA (methicillin resistant Staphylococcus aureus)     states about a year ago  . CHF (congestive heart failure)   . Chronic abdominal pain   . Hidradenitis   . Chronic pain     "all  over"  . Renal insufficiency   . On home O2     3L N/C O2 continuously  . OSA (obstructive sleep apnea)     non-compliant with CPAP    MEDICATIONS:  Prior to Admission medications   Medication Sig Start Date End Date Taking? Authorizing Provider  albuterol-ipratropium (COMBIVENT) 18-103 MCG/ACT inhaler Inhale 2 puffs into the lungs every 4 (four) hours as needed for wheezing or shortness of breath. 11/21/14   Debbe Odea, MD  allopurinol (ZYLOPRIM) 100 MG tablet Take 1 tablet (100 mg total) by mouth every morning. 01/22/14   Donne Hazel, MD  ALPRAZolam Duanne Moron) 1 MG tablet Take 1 mg by mouth at bedtime as needed for anxiety (anxiety).    Historical Provider, MD  ammonium lactate (AMLACTIN) 12 % cream Apply 1 g topically daily as needed for dry skin. 04/22/14   Linton Flemings, MD  aspirin 325 MG tablet Take 325 mg by mouth every morning.     Historical Provider, MD  atorvastatin (LIPITOR) 20 MG tablet Take 1 tablet (20 mg total) by mouth every morning. 01/22/14   Donne Hazel, MD  benzonatate (TESSALON) 100 MG capsule Take 1 capsule (100 mg total) by mouth every 8 (eight) hours. Patient taking differently: Take 100 mg by mouth 3 (three) times daily as needed for cough.  12/01/14   Veatrice Kells, MD  budesonide-formoterol [facility]) 160-4.5 MCG/ACT inhaler  Inhale 2 puffs into the lungs 2 (two) times daily. 11/21/14   Debbe Odea, MD  cloNIDine (CATAPRES) 0.2 MG tablet Take 0.2 mg by mouth 2 (two) times daily.    Historical Provider, MD  clotrimazole (LOTRIMIN) 1 % cream Apply to affected area 2 times daily Patient taking differently: Apply 1 application topically 2 (two) times daily.  06/20/14   Charlann Lange, PA-C  diphenhydrAMINE (BENADRYL) 25 mg capsule Take 25 mg by mouth every 6 (six) hours as needed for itching or allergies (allergies).     Historical Provider, MD  Emollient (EUCERIN) lotion Apply 5 mLs topically 2 (two) times daily as needed for dry skin. Patient taking differently: Apply 5  mLs topically 3 (three) times daily.  04/22/14   Linton Flemings, MD  folic acid (FOLVITE) 1 MG tablet Take 1 tablet (1 mg total) by mouth daily. 01/22/14   Donne Hazel, MD  furosemide (LASIX) 40 MG tablet Take 1 tablet (40 mg total) by mouth 2 (two) times daily. 01/22/14   Donne Hazel, MD  guaiFENesin (MUCINEX) 600 MG 12 hr tablet Take 1 tablet (600 mg total) by mouth 2 (two) times daily. Patient not taking: Reported on 12/12/2014 11/21/14   Debbe Odea, MD  hydrOXYzine (ATARAX/VISTARIL) 25 MG tablet Take 25 mg by mouth 3 (three) times daily as needed for anxiety or itching (anxiety and itching).     Historical Provider, MD  ketoconazole (NIZORAL) 2 % shampoo Apply 1 application topically daily as needed for irritation (irritation).     Historical Provider, MD  ketoconazole (NIZORAL) 200 MG tablet Take 200 mg by mouth every morning.     Historical Provider, MD  lisinopril (PRINIVIL,ZESTRIL) 40 MG tablet Take 40 mg by mouth daily.    Historical Provider, MD  Multiple Vitamin (MULTIVITAMIN WITH MINERALS) TABS tablet Take 1 tablet by mouth daily. 01/22/14   Donne Hazel, MD  pantoprazole (PROTONIX) 40 MG tablet Take 1 tablet (40 mg total) by mouth every morning. 01/22/14   Donne Hazel, MD  predniSONE (DELTASONE) 20 MG tablet Take 1 tablet (20 mg total) by mouth daily with breakfast. 11/21/14   Debbe Odea, MD  solifenacin (VESICARE) 5 MG tablet Take 5 mg by mouth every morning.     Historical Provider, MD  vitamin B-12 (CYANOCOBALAMIN) 1000 MCG tablet Take 1 tablet (1,000 mcg total) by mouth every morning. 01/22/14   Donne Hazel, MD  vitamin E 100 UNIT capsule Take 1 capsule (100 Units total) by mouth every morning. 01/22/14   Donne Hazel, MD    ALLERGIES:  Allergies  Allergen Reactions  . Ibuprofen Hives, Shortness Of Breath and Palpitations  . Adhesive [Tape] Rash  . Azithromycin Hives and Rash  . Doxycycline Rash  . Penicillins Rash    Has patient had a PCN reaction causing immediate  rash, facial/tongue/throat swelling, SOB or lightheadedness with hypotension:Yes Has patient had a PCN reaction causing severe rash involving mucus membranes or skin necrosis:Yes Has patient had a PCN reaction that required hospitalization Yes Has patient had a PCN reaction occurring within the last 10 years:No If all of the above answers are "NO", then may proceed with Cephalosporin use.   . Sulfa Antibiotics Rash  . Sulfamethoxazole-Trimethoprim Rash    SOCIAL HISTORY:  Social History  Substance Use Topics  . Smoking status: Former Smoker    Types: Cigarettes    Quit date: 03/25/2003  . Smokeless tobacco: Former Systems developer    Quit date: 05/27/1978  .  Alcohol Use: No    FAMILY HISTORY: Family History  Problem Relation Age of Onset  . Asthma Mother   . Diabetes Father   . Heart disease Father   . Stroke Father     EXAM: BP 123/51 mmHg  Pulse 98  Temp(Src) 98 F (36.7 C) (Oral)  Resp 20  SpO2 92%  LMP 03/24/2009 CONSTITUTIONAL: Alert and oriented and responds appropriately to questions. Well-appearing; well-nourished. Morbidly obese  HEAD: Normocephalic EYES: Conjunctivae clear, PERRL ENT: normal nose; no rhinorrhea; moist mucous membranes; pharynx without lesions noted NECK: Supple, no meningismus, no LAD  CARD: RRR; S1 and S2 appreciated; no murmurs, no clicks, no rubs, no gallops RESP: Normal chest excursion without splinting or tachypnea; breath sounds clear and equal bilaterally; no wheezes, no rhonchi, no rales, no hypoxia or respiratory distress, speaking full sentences, slightly diminished at her bases bilaterally ABD/GI: Normal bowel sounds; non-distended; soft, non-tender, no rebound, no guarding, no peritoneal signs BACK:  The back appears normal and is non-tender to palpation, there is no CVA tenderness EXT: Normal ROM in all joints; non-tender to palpation; no pitting edema; patient feels like her legs are swollen but exam is limited secondary to obesity, normal  capillary refill; no cyanosis, no calf tenderness or swelling    SKIN: Normal color for age and race; warm NEURO: Moves all extremities equally, sensation to light touch intact diffusely, cranial nerves II through XII intact PSYCH: The patient's mood and manner are appropriate. Grooming and personal hygiene are appropriate.  MEDICAL DECISION MAKING: Patient here with chest pain and shortness of breath. Patient has presented to the emergency department several times recently for shortness of breath. She is recently underwent negative VQ scan and negative CT angio of her chest. We'll give DuoNeb treatment, obtain cardiac labs, chest x-ray. She feels her legs are more swollen than normal. We'll give IV Lasix.  ED PROGRESS: Patient's labs show leukocytosis with left shift which may be secondary to recent steroid use for COPD exacerbation. First troponin is negative.  Will repeat second troponin and continue to monitor patient. Chest x-ray shows a previous perihilar infiltrate versus edema that is improving. Her BNP is normal. Doubt pulmonary embolus given recent negative workups.    8:15 AM  Patient's second troponin is mildly positive at 0.08.  She has been here multiple times in the past several weeks for similar symptoms and has always had negative troponins. Creatinine is mildly elevated at 1.25 which is chronic. Discussed with Trish with cardiology who will have a cardiologist see the patient for recommendations. We'll repeat a third troponin now.  I feel patient does have chronic symptoms but is likely here mostly because she is homeless and has nowhere to go. Signed out to oncoming physician for follow-up.    EKG Interpretation  Date/Time:  Wednesday December 13 2014 02:28:35 EDT Ventricular Rate:  88 PR Interval:  141 QRS Duration: 103 QT Interval:  377 QTC Calculation: 456 R Axis:   57 Text Interpretation:  Sinus rhythm Ventricular premature complex Low voltage, precordial leads No  significant change since last tracing Confirmed by WARD,  DO, KRISTEN [id number]) on 12/13/2014 2:30:33 AM         I personally performed the services described in this documentation, which was scribed in my presence. The recorded information has been reviewed and is accurate.   [place], DO 12/13/14 [phone]

## 2014-12-13 NOTE — ED Provider Notes (Signed)
Received patient in turnover from Dr. Leonides Schanz, patient frequently seen in the emergency department here for abdominal pain and chest pain. Found to have second troponin positive. Cardiology saw the patient feel that one more repeat troponin negative safe for discharge home.  Troponin negative will discharge. Patient states she has no where to go social work consult.     Deno Etienne, DO 12/13/14 1538

## 2014-12-13 NOTE — Discharge Instructions (Signed)

## 2014-12-20 DIAGNOSIS — J45901 Unspecified asthma with (acute) exacerbation: Secondary | ICD-10-CM

## 2014-12-20 DIAGNOSIS — Q249 Congenital malformation of heart, unspecified: Secondary | ICD-10-CM

## 2014-12-20 NOTE — Congregational Nurse Program (Signed)
This  49  year old very obese   female clien'ts first  visitt to  see  nurse as she  was ending  her  day.  Requesting her blood pressure  be  Taken. Nurse had  difficulty  Getting a reading  as cluff would  not  Fit snuggley. Used  manual cuff to  obtain  pressure reading and still  very  difficult to  Hear. When ask  If  this  has occurred before client replied  yes . Counseled regarding results stated that is  close to  what  It has been. Nurse  ask  If  she  had questions for the nurse and she ask  about a knot under her right armpit. Nurse inspected area, area was raised ,red no apparent drainage or open  area. Appeared to be a boil ,ask if she had a boil before she stated yes and it had to  be drained .Stated she had  recently  been hospitalized but wasn't sure when maybe 2 weeks ago. Has an appointment with her PCP she thinks  12-23-2014. Her  MD is York Ram on  [address].  Client complains of swelling of feet and legs  Which  has been occurring for some time,takes her  Medications. Nurse will  Review her med box .

## 2014-12-25 ENCOUNTER — Ambulatory Visit (HOSPITAL_BASED_OUTPATIENT_CLINIC_OR_DEPARTMENT_OTHER): Payer: Medicaid Other | Attending: Pulmonary Disease

## 2014-12-27 DIAGNOSIS — R1030 Lower abdominal pain, unspecified: Secondary | ICD-10-CM

## 2014-12-27 DIAGNOSIS — I509 Heart failure, unspecified: Secondary | ICD-10-CM

## 2014-12-27 DIAGNOSIS — I110 Hypertensive heart disease with heart failure: Secondary | ICD-10-CM

## 2014-12-27 NOTE — Congregational Nurse Program (Signed)
Pt seen  For  Nurse  Visit  Today  Vitals  And  Weight  Taken . To see PCP  On tomorrow

## 2014-12-27 NOTE — Patient Instructions (Addendum)
Visit  12-27-14 with  Nurse  .  Able to Update  history during  today's  visit. Complaints  of  Headache  today  On a scale  1-5 states it is a 5. Wants to  make  sure blood  pressure not  up.  Vital  Signs  obtained.  Last admission  to  hospital  had  pneumonia . Cough productive today  ,completed  antibiotics ,she   reports  has not  been  back  T\to  MD  for  Post  Discharge  Check up nurse  ncouraged    to  go tomorrow since she  doesn't need an  appointment states MD  will see so  many  walk ins.  History  Of  CHF today's assessments ,ankles swollen , some congestion ,  listened to  chest no wheezing . Needs dental  care was given  names of  dentist accepting  Medicaid  Given to  Client  To  Call  For appointment .  Uses  [employer] transportation.  Somewhat  depressed  regarding her  Dad's recent admissions to  [facility] and she  cant see him  or  talk  with  him.  Son 56 year old and  Daughter 25 years old ,haven't  had  contact  with  her recently .  they  are having  their  own issues  she  States..   Lost  her  Social  Security card  needs to  [employer]  another .

## 2015-01-16 IMAGING — CR DG CHEST 2V
2 series · 2 of 2 positions shown · non-contrast
Comparison: Two-view chest 05/09/2012.

CLINICAL DATA: Headache.  Arm pain.  Shortness of breath.
Weakness.

CHEST - 2 VIEW

[w chest lat]
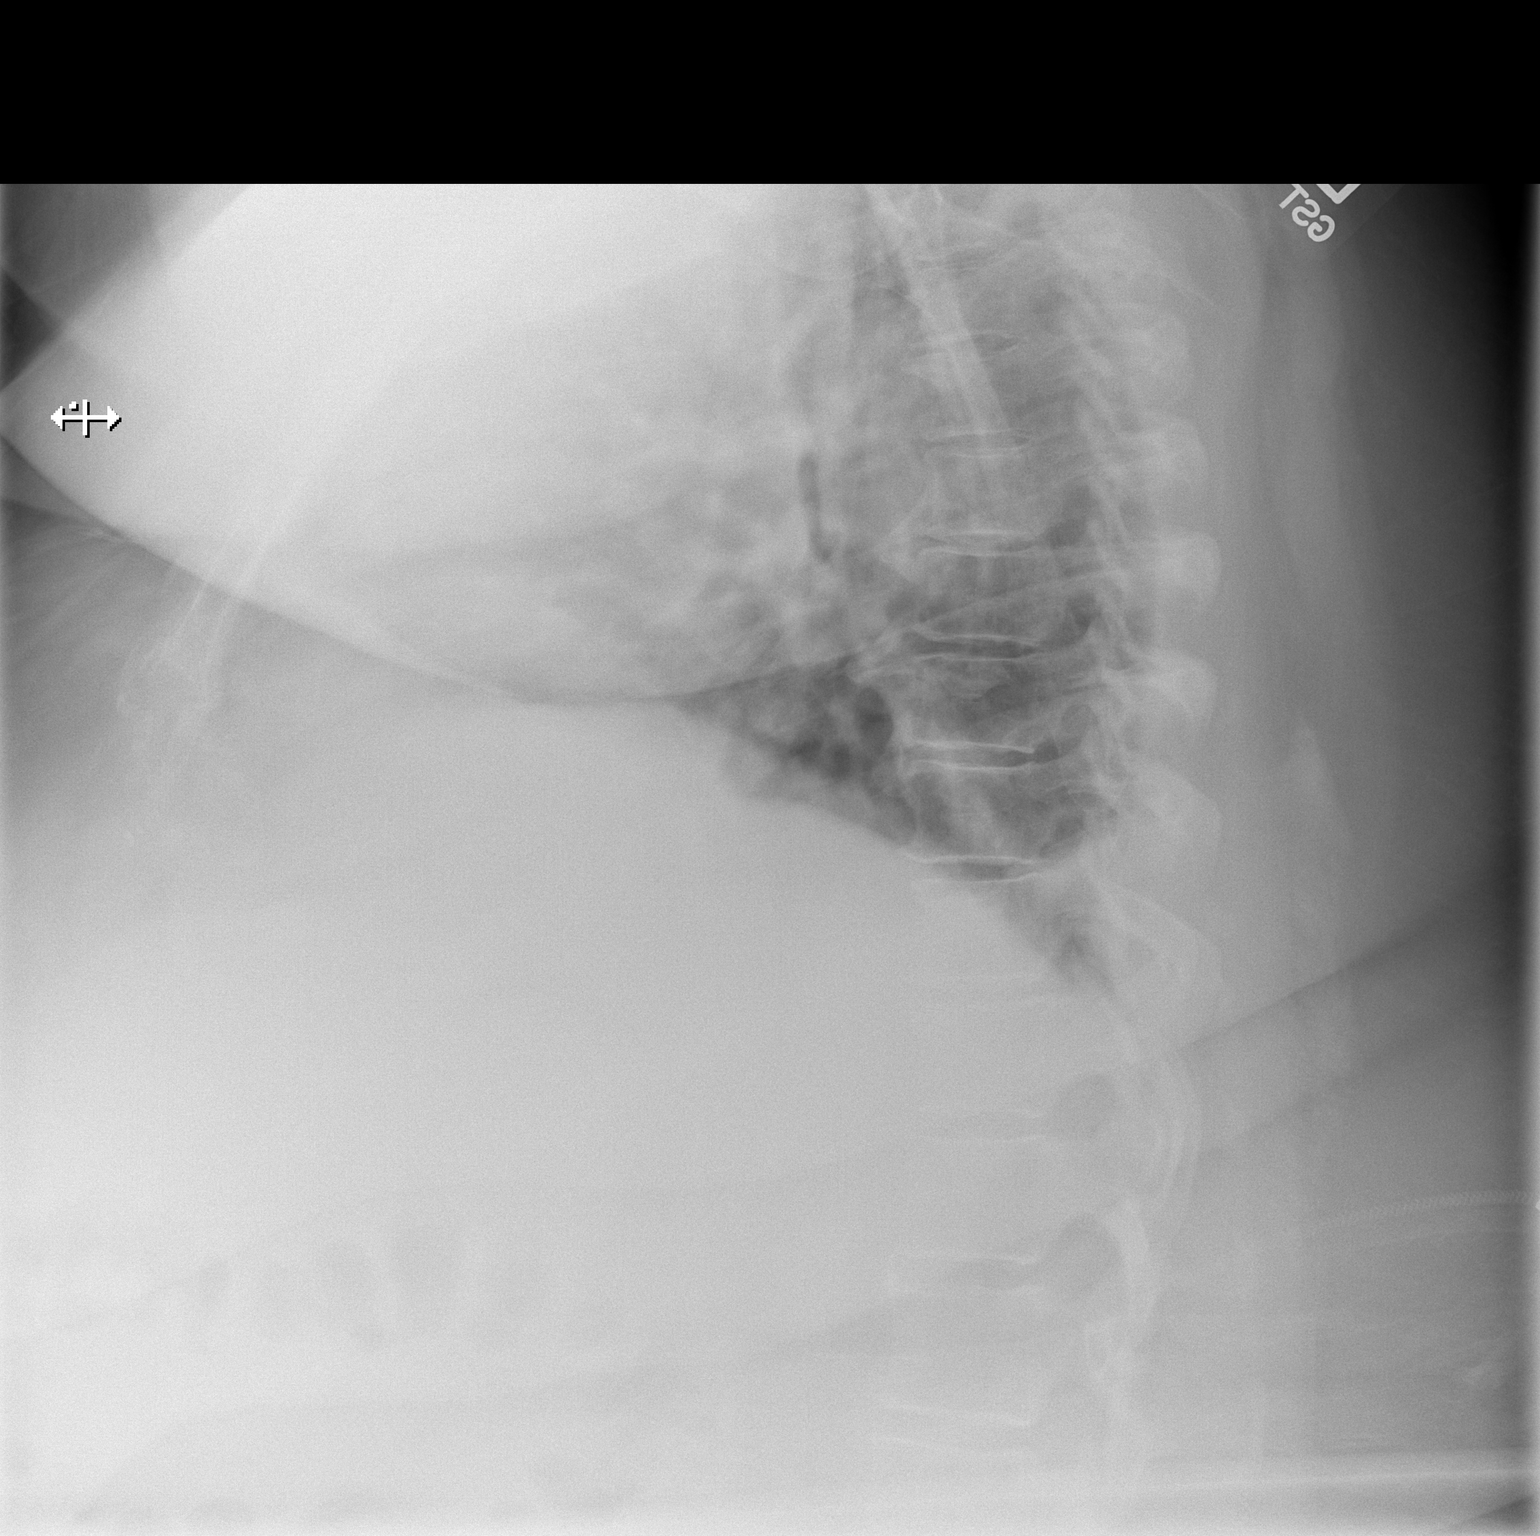

[x chest ap]
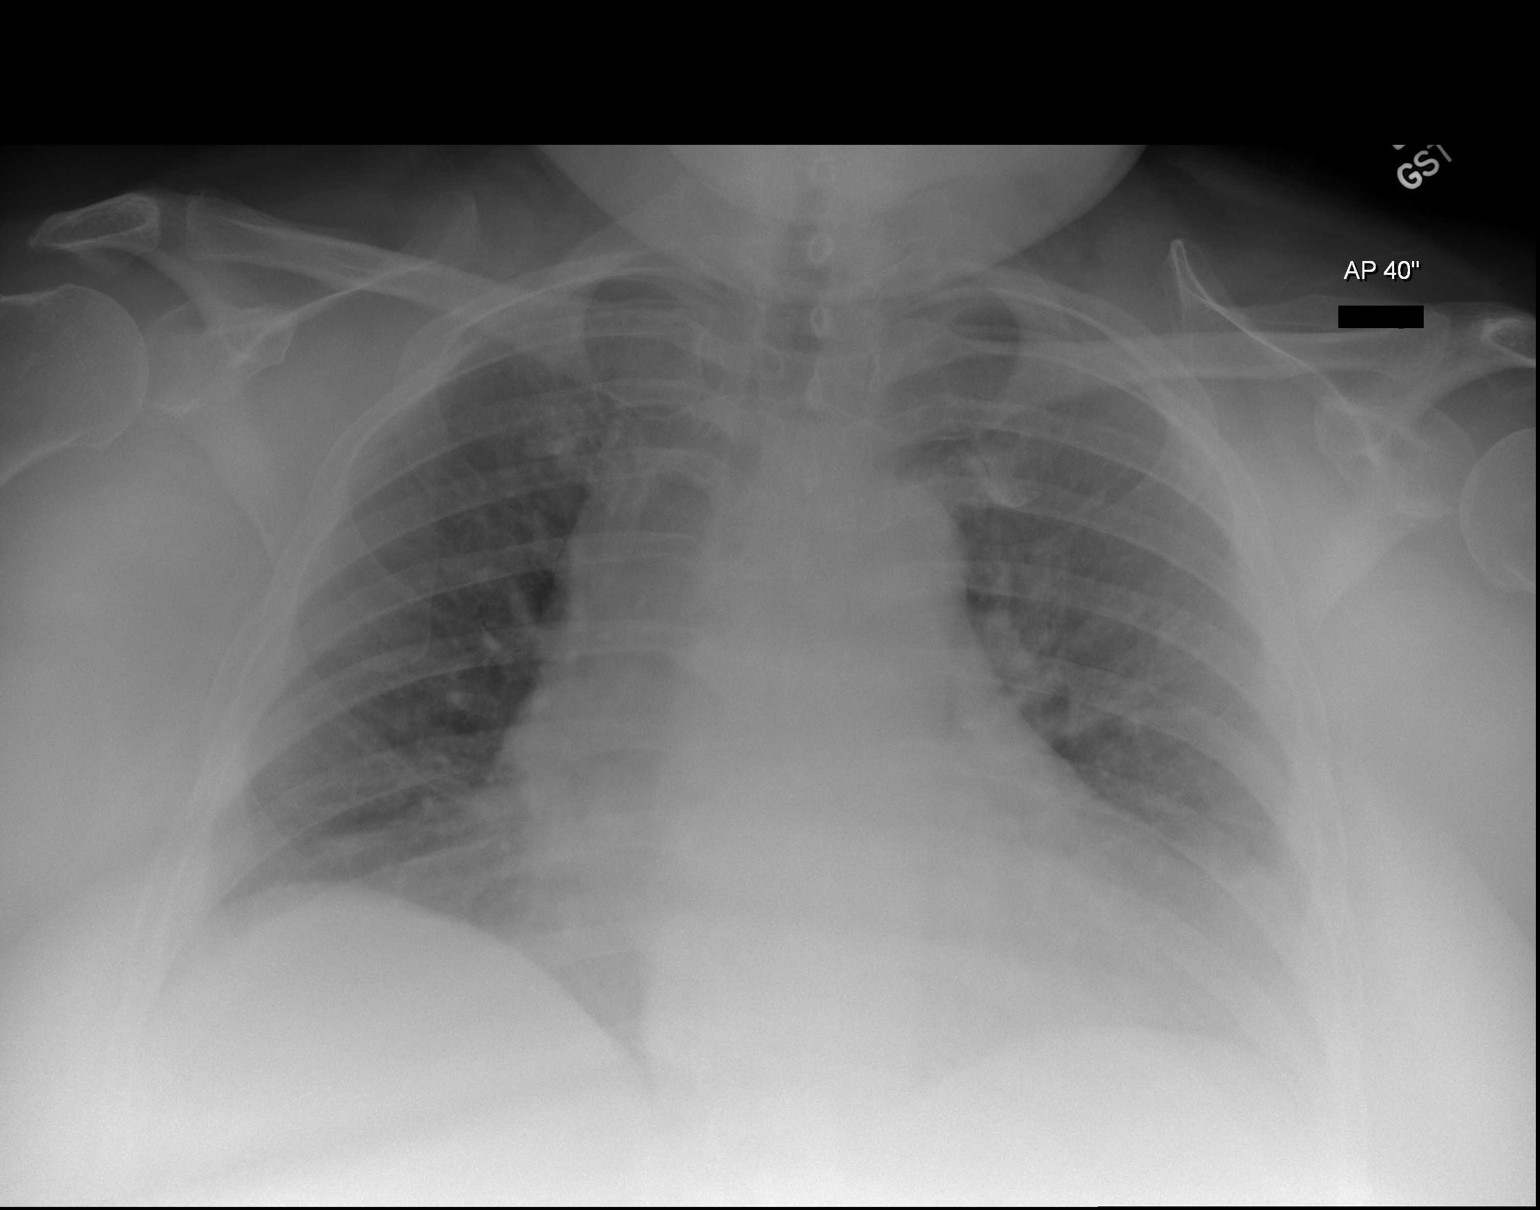

[2 of 2 positions shown; findings below may reference images not displayed]

FINDINGS: The heart is enlarged.  There is no significant edema or
effusion to suggest failure. Aeration is improved.  No focal
airspace disease is evident.  Lung volumes are low.
IMPRESSION: 1.  Low lung volumes.
2.  No acute cardiopulmonary disease.

## 2015-01-17 DIAGNOSIS — Q249 Congenital malformation of heart, unspecified: Secondary | ICD-10-CM

## 2015-01-17 DIAGNOSIS — J45901 Unspecified asthma with (acute) exacerbation: Secondary | ICD-10-CM

## 2015-01-17 NOTE — Congregational Nurse Program (Signed)
Congregational Nurse Program Note  Date of Encounter: 01/17/2015  Past Medical History: Past Medical History  Diagnosis Date  . Asthma   . CHF (congestive heart failure) ([place])   . Allergy   . Anxiety   . GERD (gastroesophageal reflux disease)   . Sleep apnea   . Hypertension   . Chronic kidney disease   . Oxygen deficiency     Encounter Details:     CNP Questionnaire - 01/17/15 2307    Patient Demographics   Is this a new or existing patient? Existing  client  checking in with  nurse   Patient is considered a/an --  homeless   Patient Assistance   Patient's financial/insurance status Medicaid;Iselin insecurities addressed Not Applicable   Transportation assistance --  nurse  discussed  giving  bus  tickets  for  follow up  medical  visit   Assistance securing medications --  client  had prescription for Tessalon  that  was written  12-01-14  that  was never filled ,asking  for assistance   Educational health offerings --  nurse counseled  regarding  importance of  MD  follow  up  visit ,encouraged to  call  for  appointment , nurse volunteered  to call  for client she  said   later not  now   Encounter Details   Primary purpose of visit Education/Health Concerns;Spiritual Care/Support Visit;Chronic Illness/Condition Visit  medication  and  blood  pressure  check   Was an Emergency Department visit averted? Yes   Does patient have a medical provider? Yes   Patient referred to --  encouraged  to  call  PCP  for appointment   Was a mental health screening completed? (GAINS tool) No      Client seen  On 01-16-15 for follow-up visit wanted  B/P  Checked  complaining of a headache for 2-3 days , no shortness of breath ,taking her Naproxen and it  Relieves headache states she doesn't have  A nebulizer  just  uses her inhaler and it helps.   Counseled  regarding her  low  blood pressure ,states it has been that low  Before and doesn't take her blood pressure medication   when it is low. encouraged  again to return to  PCP  For follow up. Nurse will assist with  medication

## 2015-01-30 DIAGNOSIS — Q249 Congenital malformation of heart, unspecified: Secondary | ICD-10-CM

## 2015-01-30 DIAGNOSIS — J45901 Unspecified asthma with (acute) exacerbation: Secondary | ICD-10-CM

## 2015-01-30 DIAGNOSIS — G44001 Cluster headache syndrome, unspecified, intractable: Secondary | ICD-10-CM

## 2015-02-01 IMAGING — CR DG CHEST 2V
2 series · 2 of 2 positions shown · non-contrast
Comparison: 06/29/2012

CLINICAL DATA: Shortness of breath

CHEST - 2 VIEW

[x chest ap]
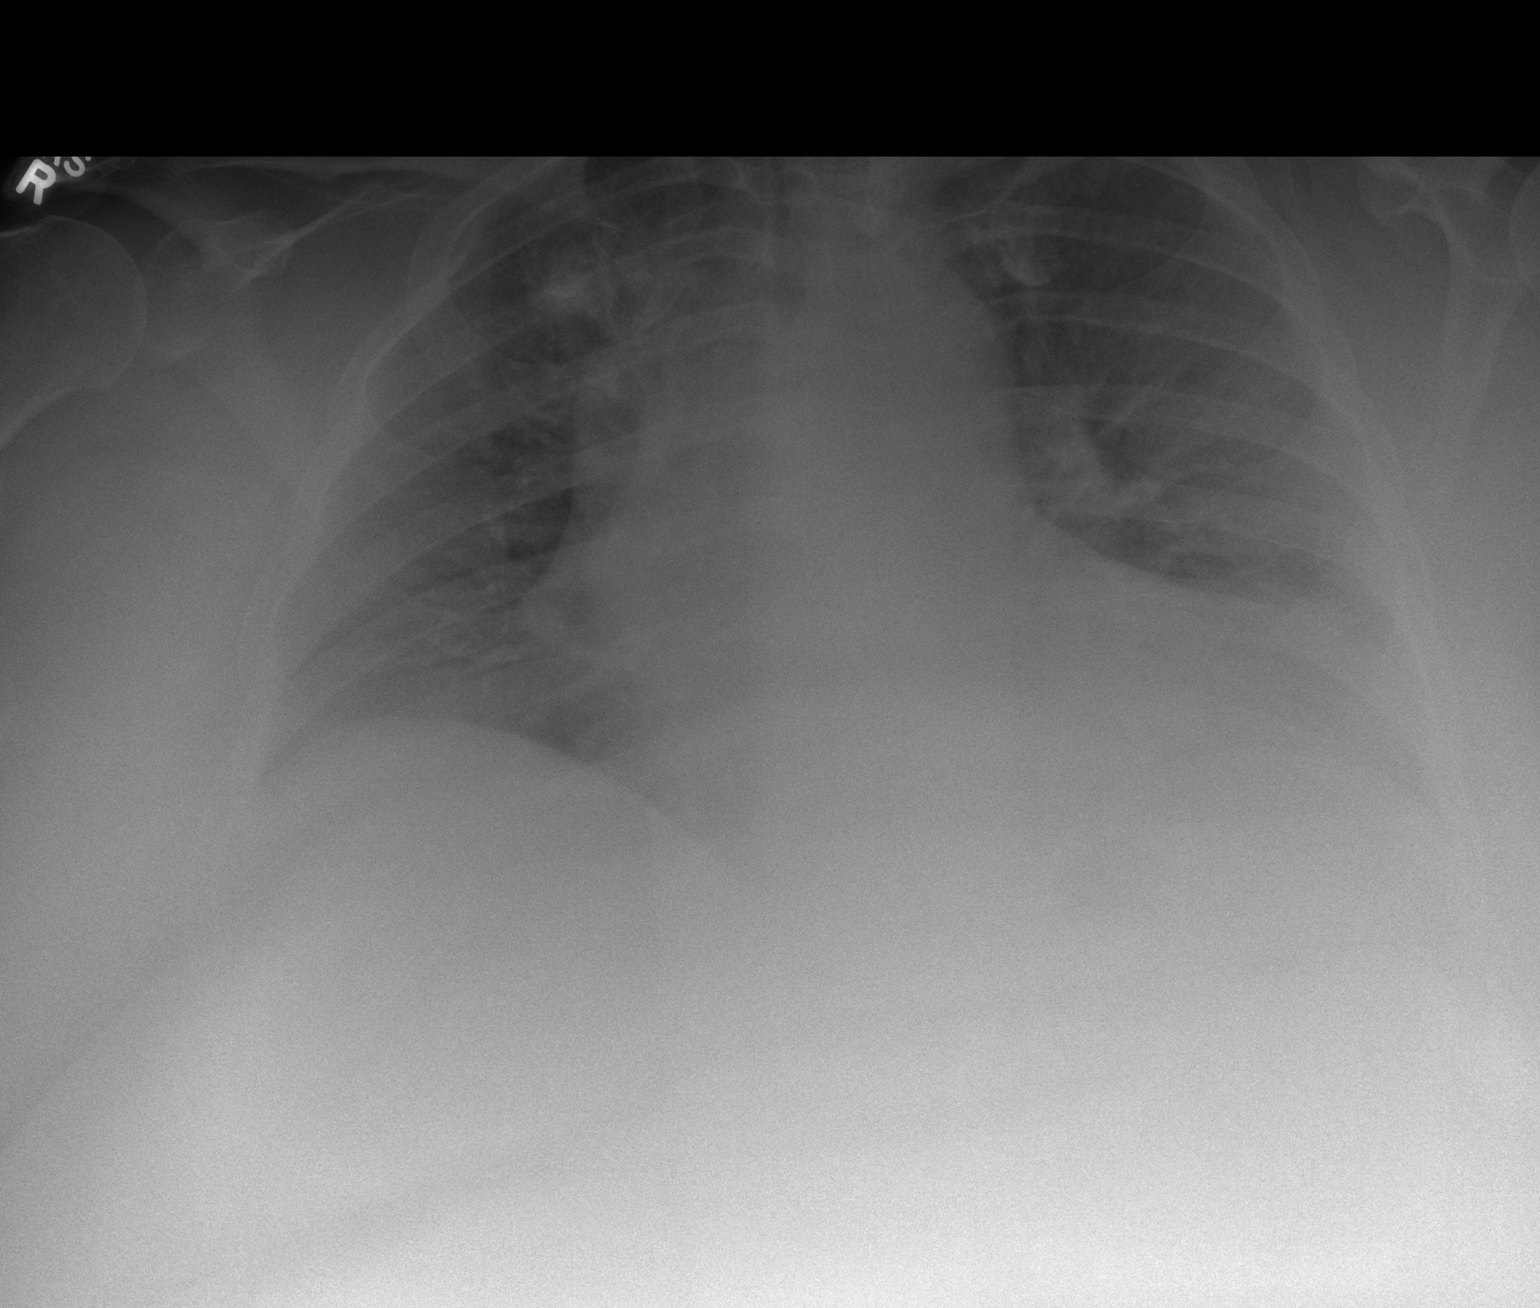

[w chest lat]
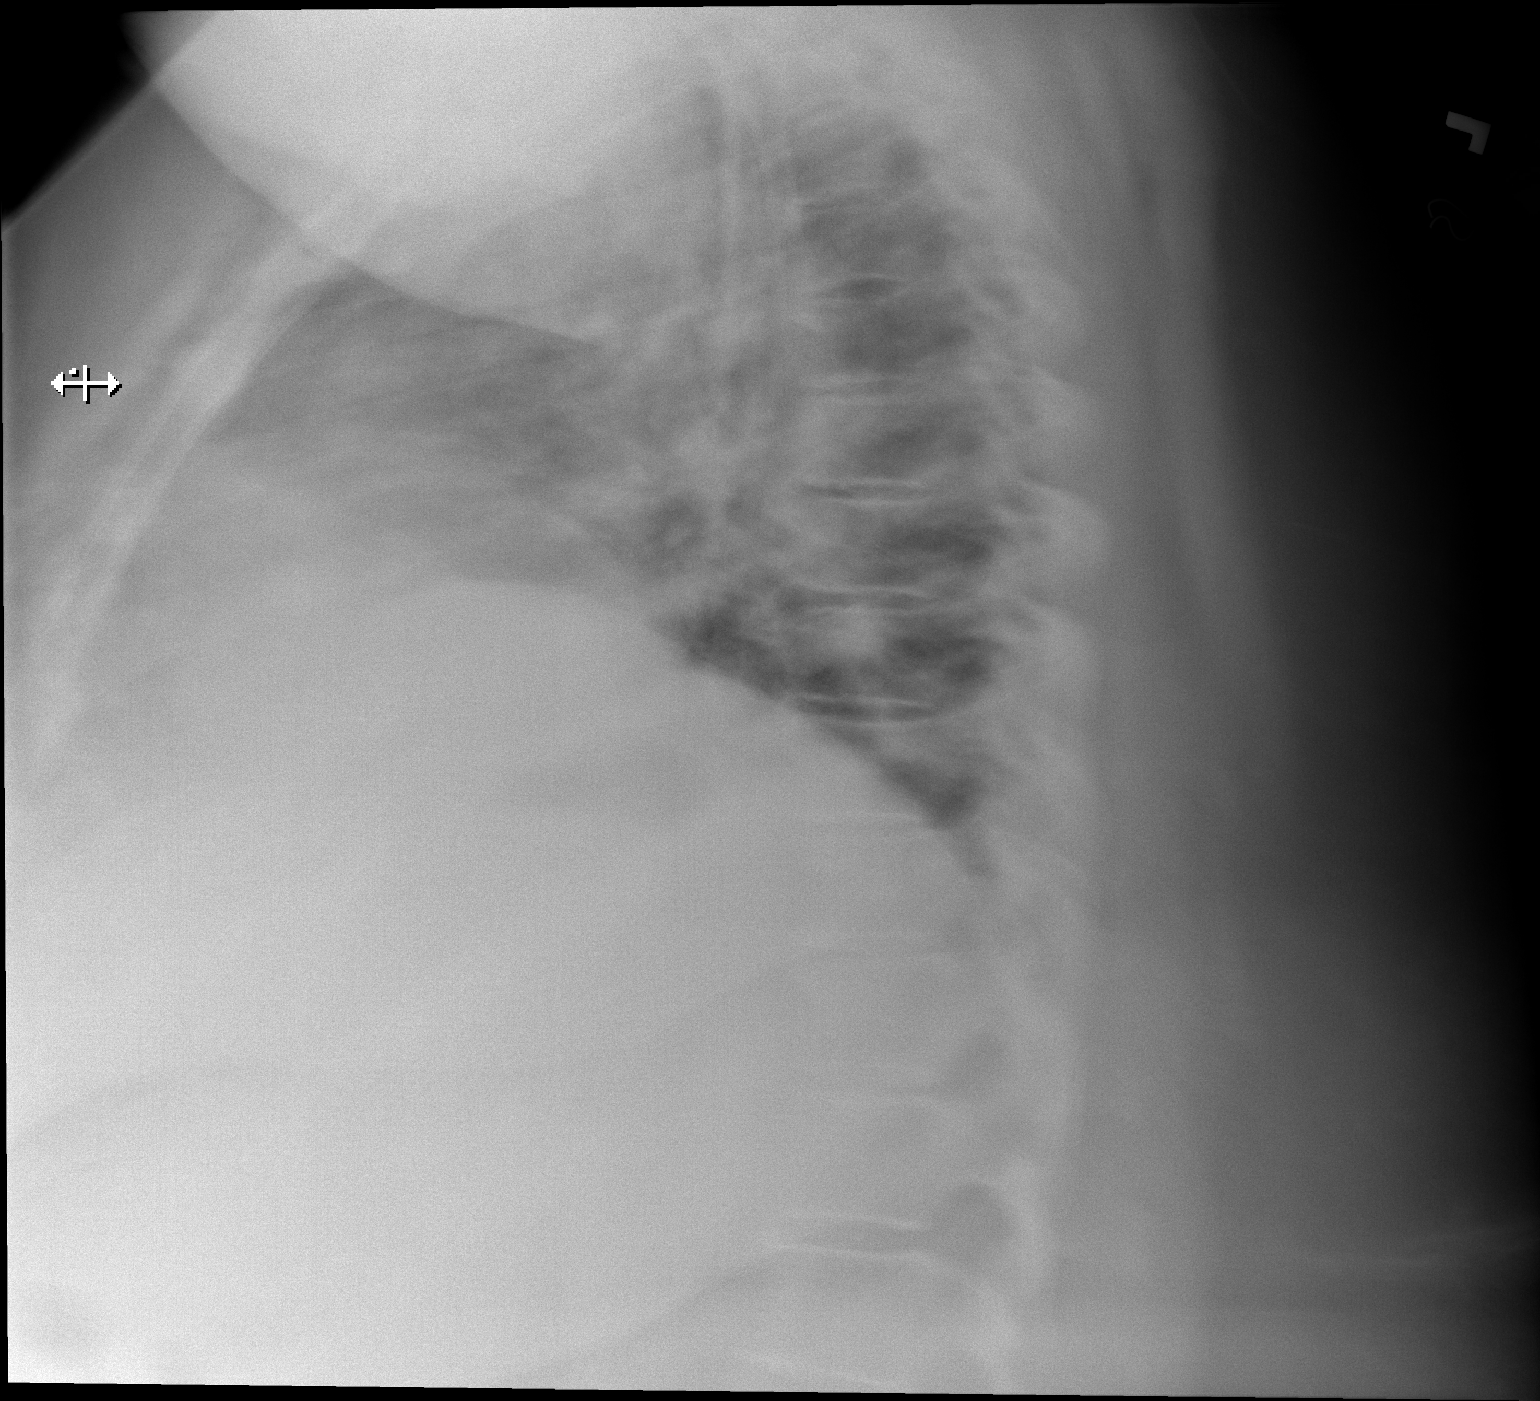

[2 of 2 positions shown; findings below may reference images not displayed]

FINDINGS: Hypoaeration.  Prominent cardiomediastinal contours,
similar to prior. Interstitial prominence may be accentuated by
patient body habitus/hypoaeration however edema not excluded.  No
pleural effusion or pneumothorax.  No acute osseous finding.
IMPRESSION: Hypoaeration.

Prominent cardiomediastinal contours, similar to prior.

Mild edema not excluded.

## 2015-02-15 IMAGING — CR DG CHEST 2V
2 series · 2 of 2 positions shown · non-contrast
Comparison: 07/15/2012 and earlier.

CLINICAL DATA: 47-year-old female with shortness of breath and
lower extremity swelling.

CHEST - 2 VIEW

[w chest lat]
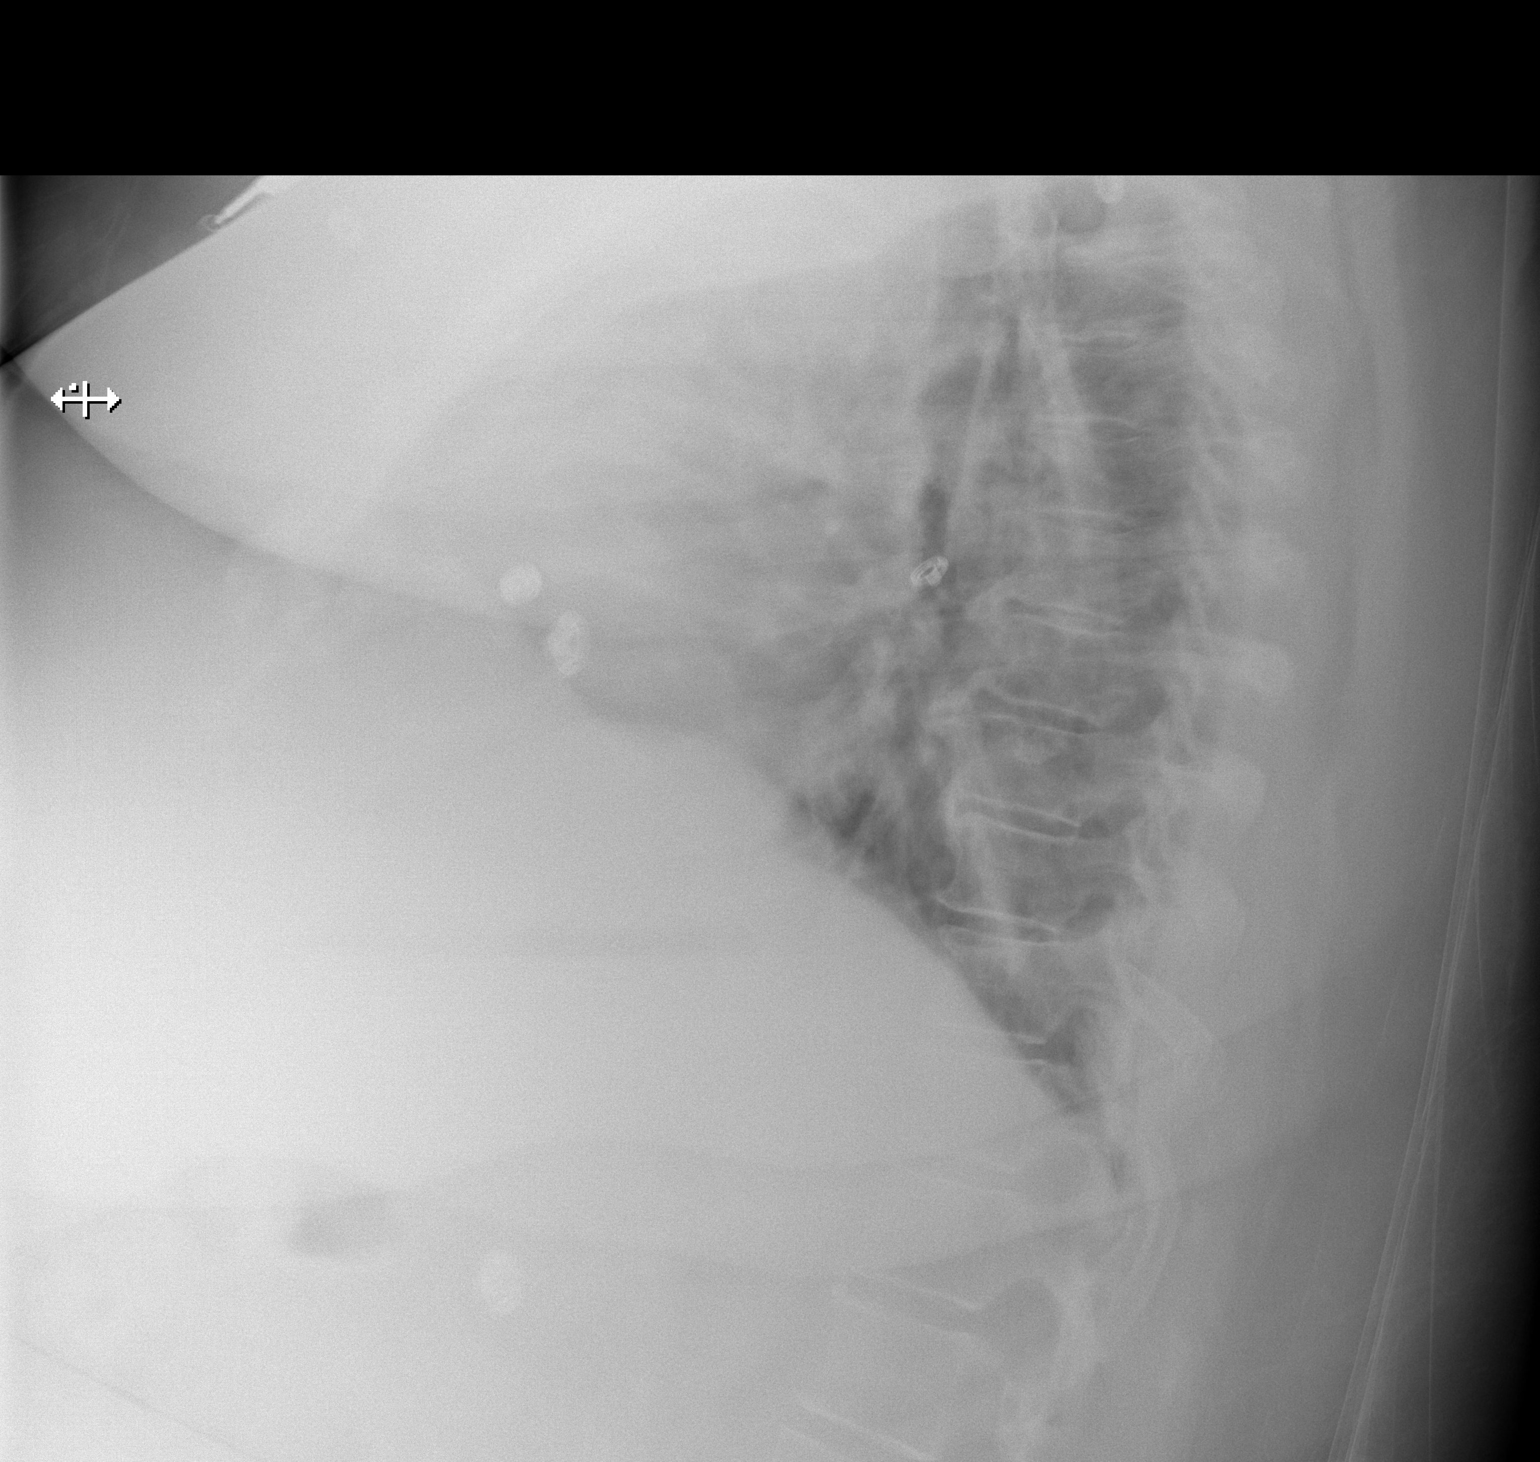

[x chest ap]
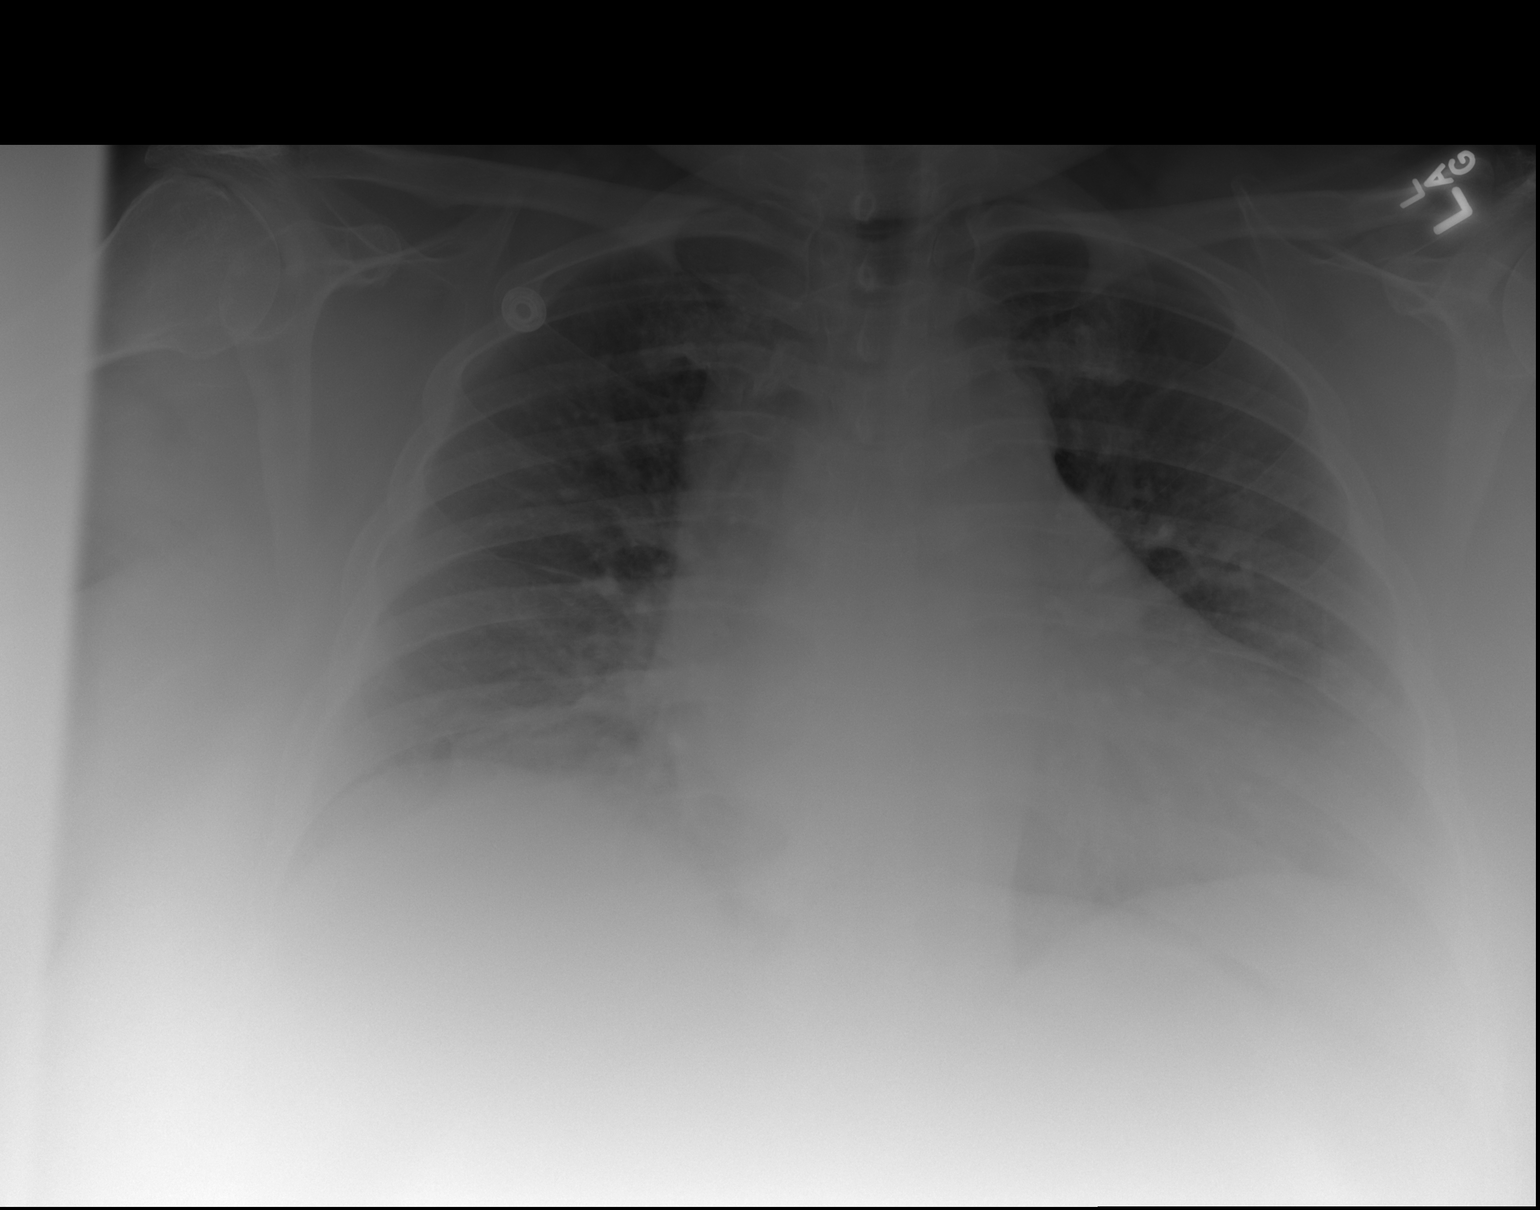

[2 of 2 positions shown; findings below may reference images not displayed]

FINDINGS: Upright AP and lateral views of the chest.  Larger lung
volumes and less respiratory motion. Stable cardiomegaly and
mediastinal contours.  No pleural effusion, pneumothorax or
consolidation.  Pulmonary vascularity appears stable or mildly
decreased.  There is trace fluid in the right minor fissure.
Visualized tracheal air column is within normal limits.  No acute
osseous abnormality identified.
IMPRESSION: Pulmonary vascular congestion with trace pleural fluid in the right
minor fissure compatible with interstitial edema.  Stable
cardiomegaly and no other acute cardiopulmonary abnormality.

## 2015-02-16 IMAGING — CR DG CHEST 1V PORT
1 series · 2 of 2 positions shown · non-contrast
Comparison: Prior chest x-ray 07/29/2012

CLINICAL DATA: Short of breath, effusion

PORTABLE CHEST - 1 VIEW

[Series 1: AP · U · 2 of 2 slices shown]
[im 1/2]
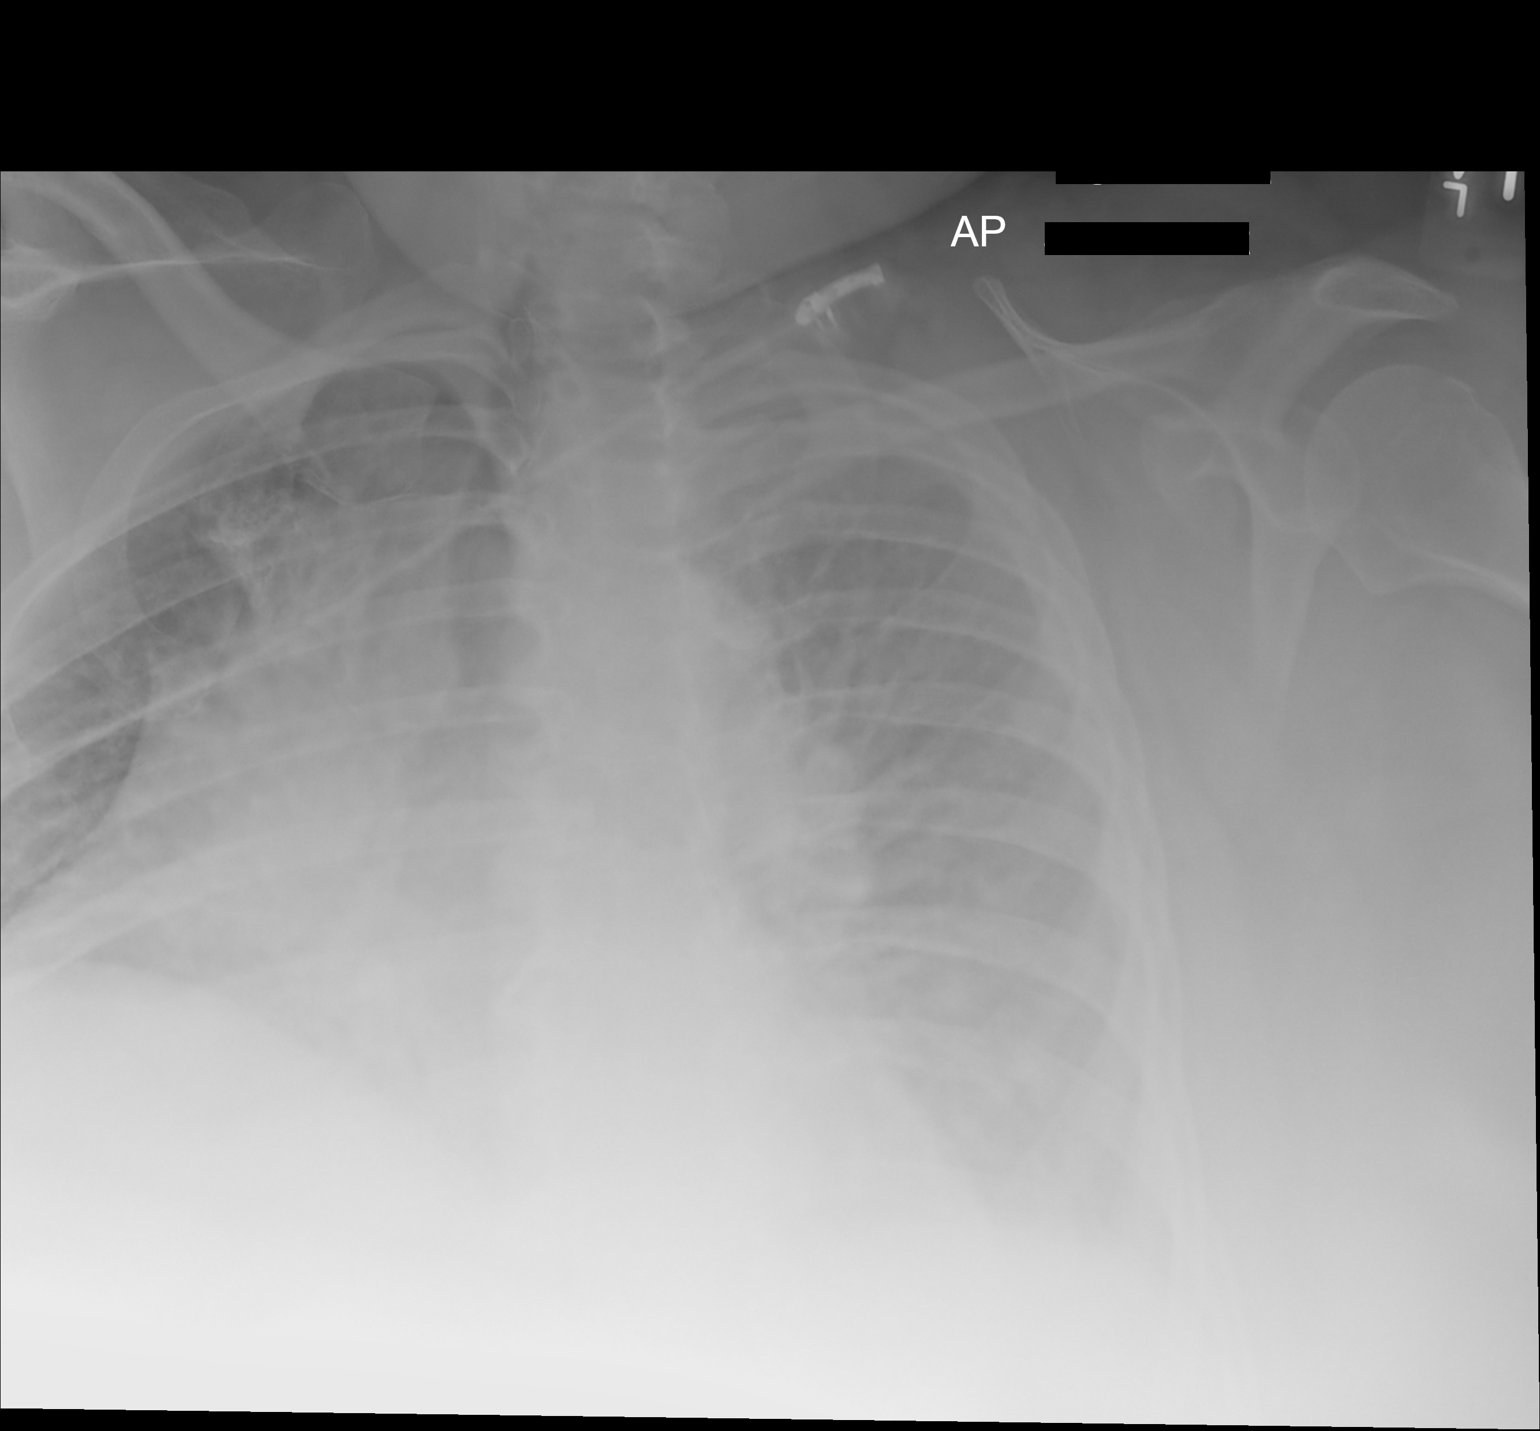
[im 2/2]
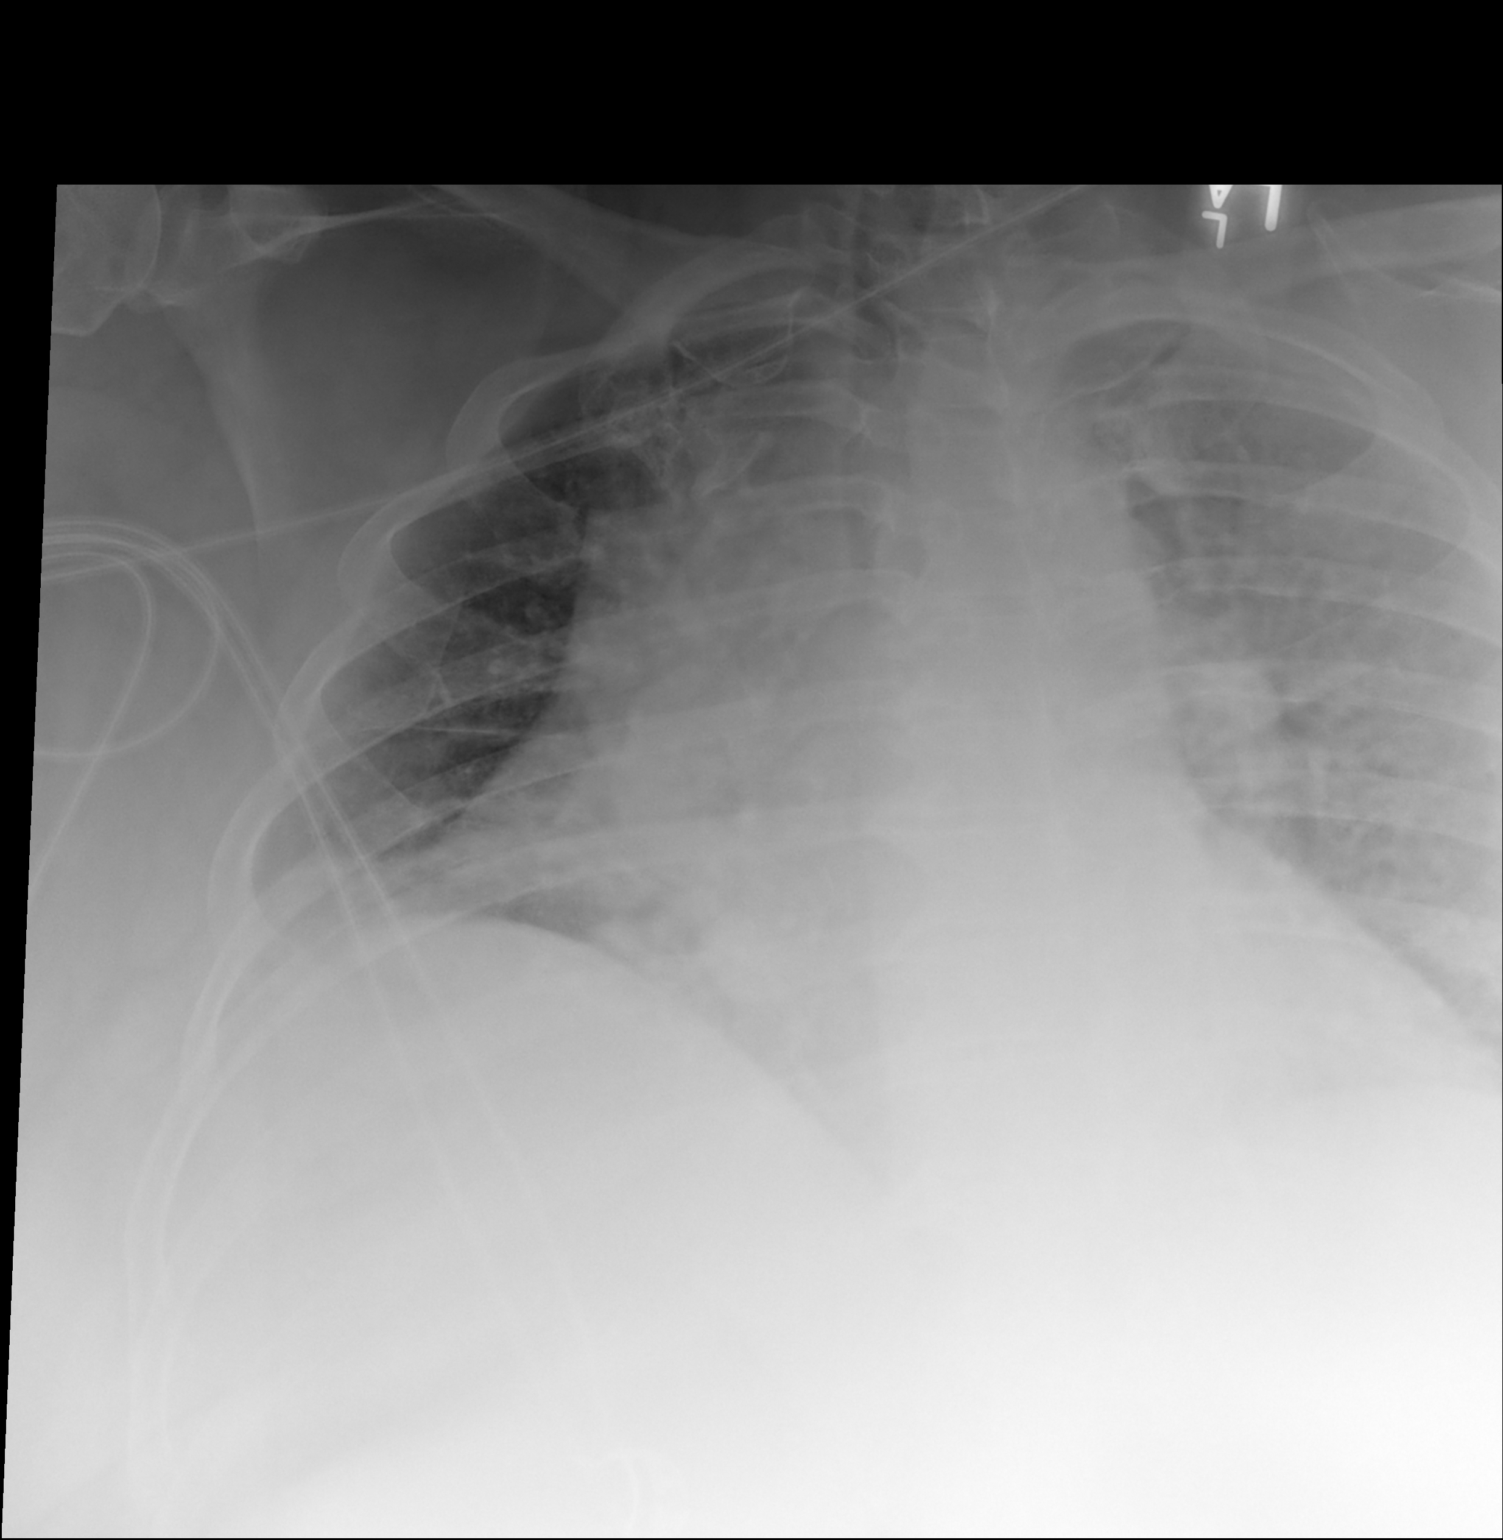

[2 of 2 positions shown; findings below may reference images not displayed]

FINDINGS: The patient is significantly rotated toward the right.
Given rotation, grossly stable cardiac and mediastinal contours.
Inspiratory volumes remain low and there is pulmonary vascular
congestion.  No pneumothorax.  Query left layering pleural
effusion.
IMPRESSION: 1.  Technically limited examination secondary to patient's
positioning with significant rightward rotation
2.  Query small left pleural effusion
3.  Otherwise, grossly stable chest x-ray

## 2015-02-21 NOTE — Congregational Nurse Program (Signed)
Client was seen by  Nurse today  After follow up  With her PCP. States he did not  Change any  Medications , thinngs are the same.and she is to keep  acStates blood  Pressure was low  MD   saw her  . She is to  Continue to evlevate her feet as much as possible , go to  ED if  Needed. Nurse counseled regarding the use of  ED  And using her PCP.  Complaints of a headache today , ona scale of 0-10 reports headache is an 8.Reports  She has not  Taken anything for it ,suggested she  Take something  For it and relax a while. Worried about  Upcoming  Discharge  From [facility] and has no plans. Discharge date is 02-21-15 unless she is granted an extension.working with  Case management to find housing.   Follow  Up  Weekly  Or as needed.

## 2015-02-25 IMAGING — CR DG CHEST 2V
3 series · 3 of 3 positions shown · non-contrast
Comparison: 07/30/2012

CLINICAL DATA: Leg swelling.  Dysuria.

CHEST - 2 VIEW

[w chest lat (1 of 2)]
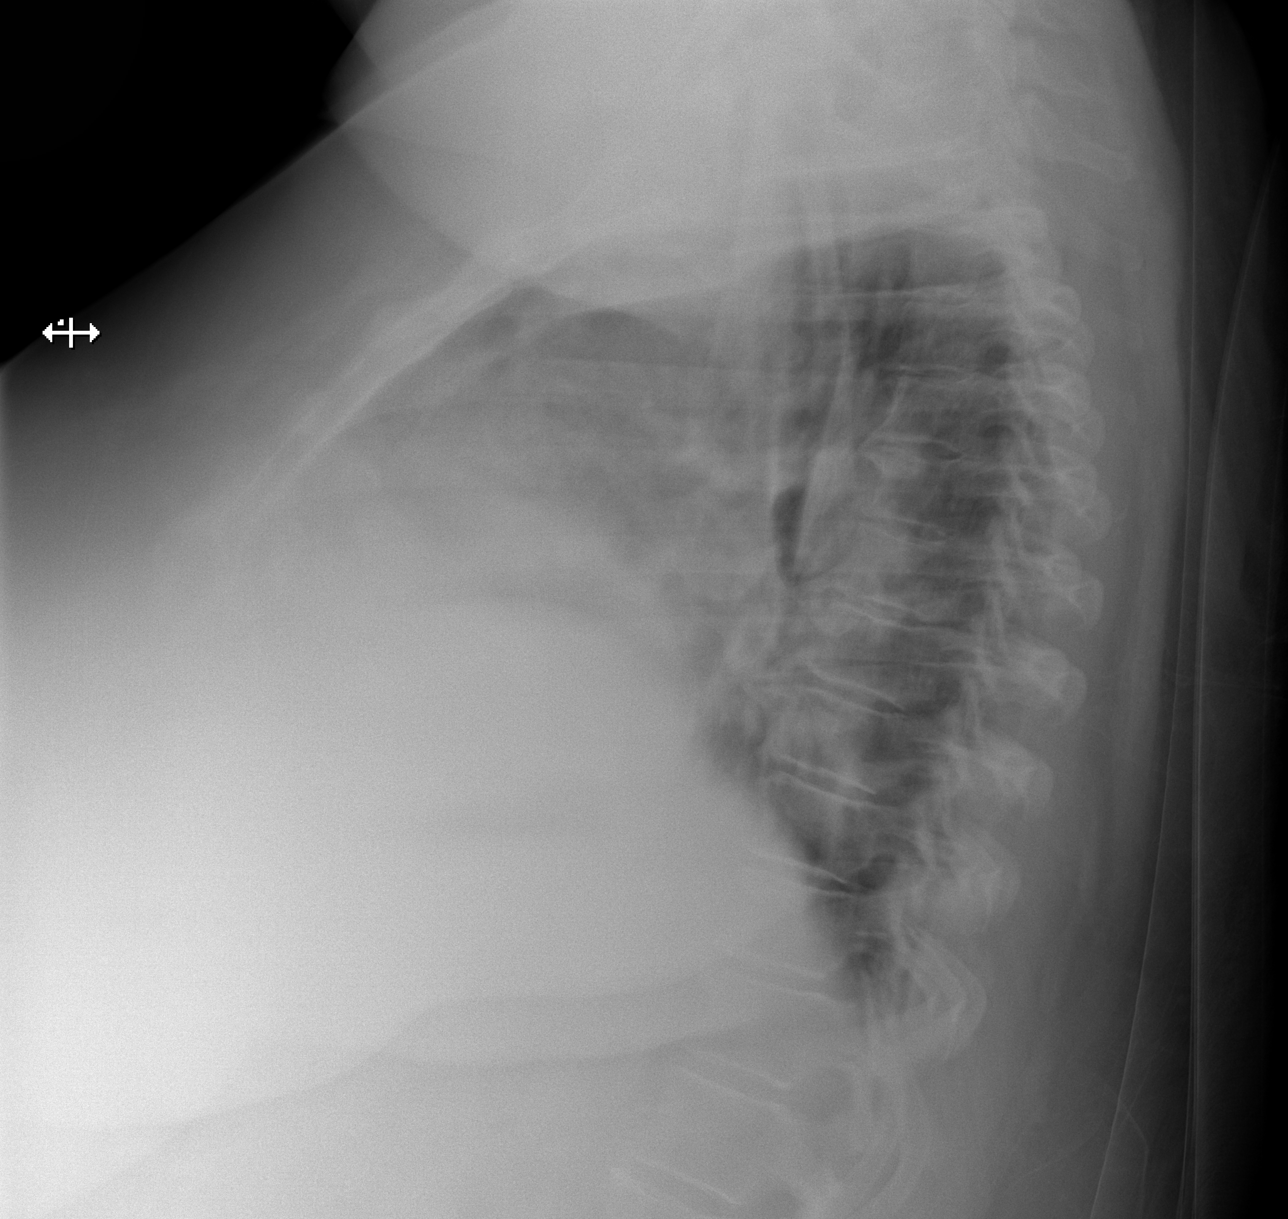

[w chest lat (2 of 2)]
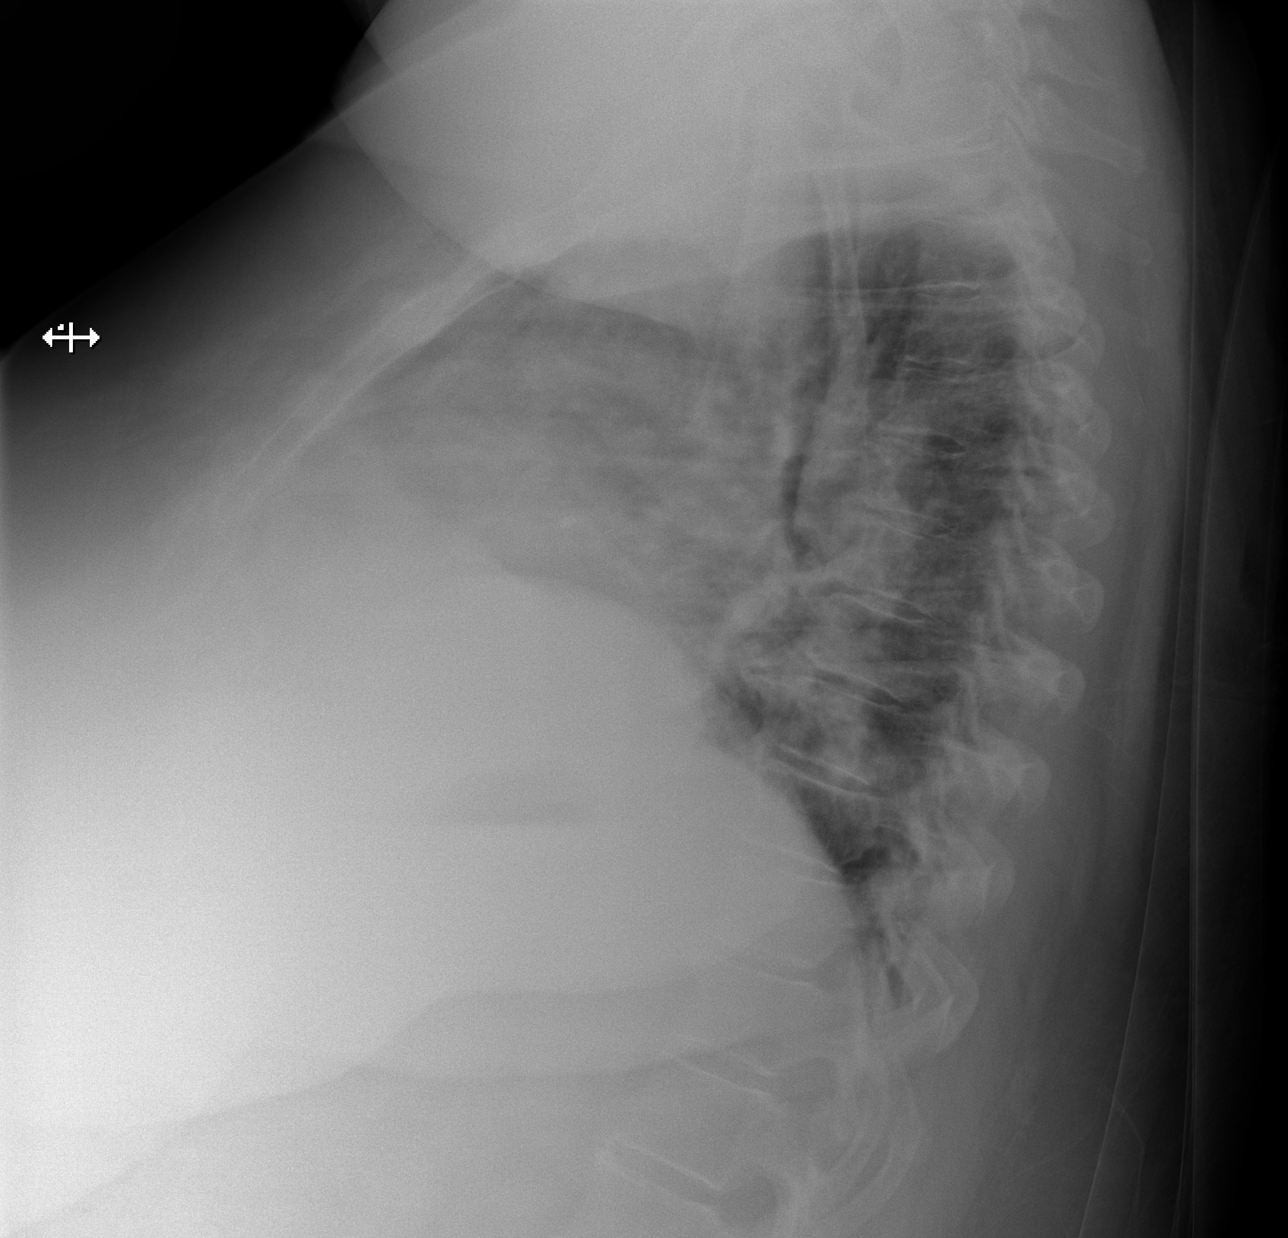

[x chest ap]
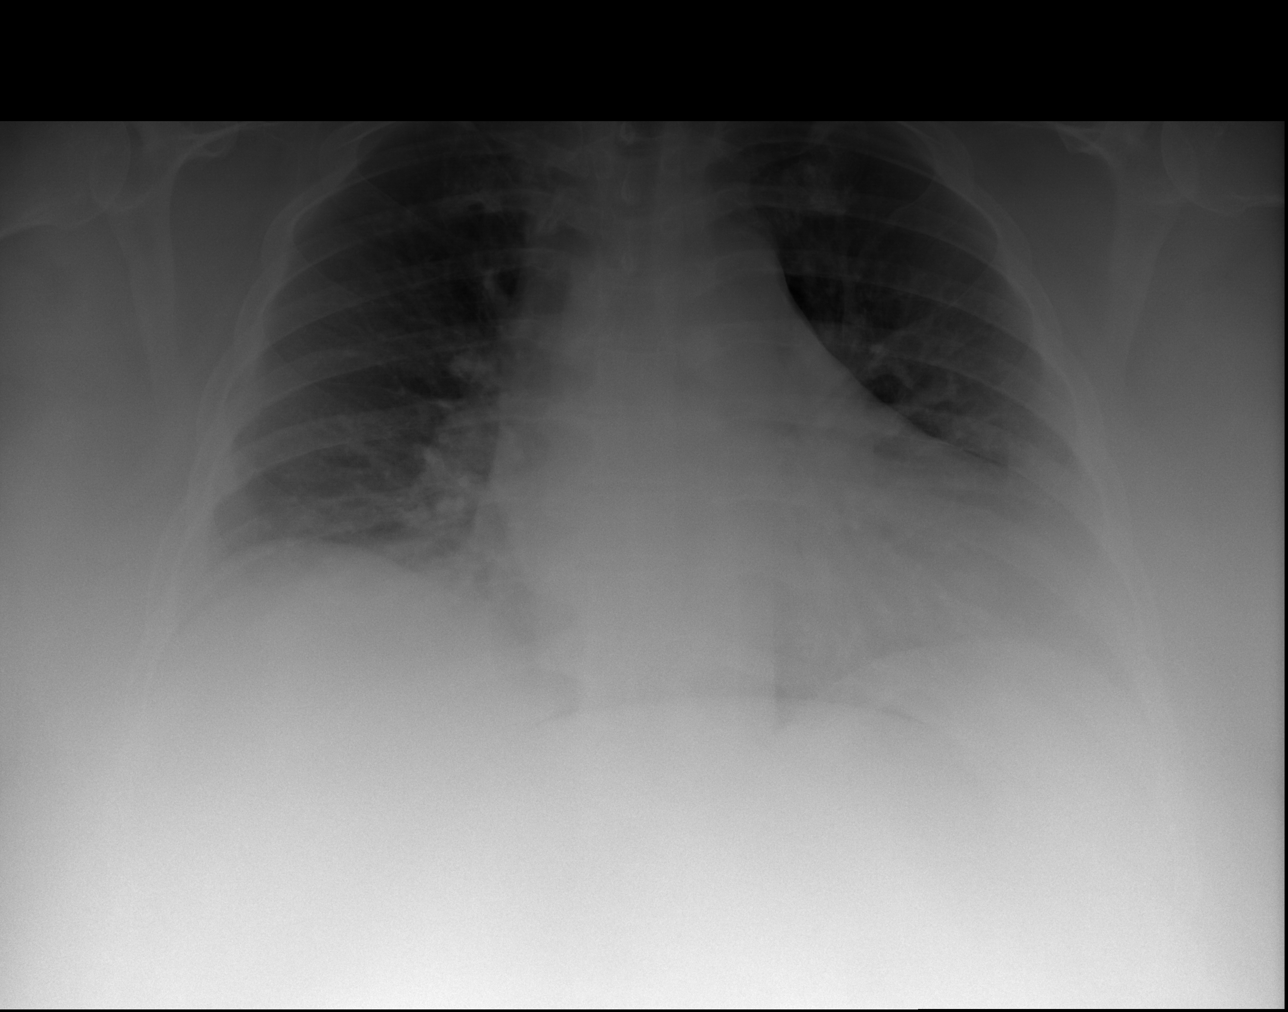

[3 of 3 positions shown; findings below may reference images not displayed]

FINDINGS: Cardiomegaly observed with low lung volumes.  Pulmonary
venous hypertension noted with mildly prominent upper zone
vasculature.  No overt edema.  No pleural effusion noted.

Thoracic spondylosis is present.
IMPRESSION: 1.  Cardiomegaly with pulmonary venous hypertension.  No overt
edema.

## 2015-03-12 DIAGNOSIS — Q249 Congenital malformation of heart, unspecified: Secondary | ICD-10-CM

## 2015-03-12 NOTE — Congregational Nurse Program (Signed)
Client  In to see nurse this pm  Complaints  of  Headache. Nurse counseled  regarding  headaches  and other  symptoms . Counseled  Regarding stress . States  She lost a friends  wallet and that  has been on her mind and he is  now blaming her.  Blood  Pressure good.

## 2015-04-02 DIAGNOSIS — IMO0001 Reserved for inherently not codable concepts without codable children: Secondary | ICD-10-CM

## 2015-04-02 DIAGNOSIS — Q249 Congenital malformation of heart, unspecified: Secondary | ICD-10-CM

## 2015-04-02 NOTE — Congregational Nurse Program (Signed)
Client  In to see nurse  At the  End of  Day  States  She  Has a cold  And has had  It  For about  6 days , have  Chills  And feeling  Warm ,states  No  Fever .Taking  Mucin- ex states  Some  Chest  Some productive  Coughing  But  Doesn't  Feel well. Nurse counseled  With  Her  History she needs to  Be seen by  Her  MD.  States  Some  Chest  Pains  And SOB. Nurse  Was  Prepared  To send  By  [facility]  To  Urgent care  Since her  PCP  cannot  See her  Until  Friday 04-06-15 , nurse  Urged  Client  To  Not  Wait ,she  Refused  Tai  To  Urgent  Care . States  She  Knows  What  To  Do  If  She  Starts  To feel worst . Can  Return to see nurse  On tomorrow .

## 2015-04-03 DIAGNOSIS — IMO0001 Reserved for inherently not codable concepts without codable children: Secondary | ICD-10-CM

## 2015-04-03 NOTE — Congregational Nurse Program (Signed)
Congregational Nurse Program Note  Date of Encounter: 04/03/2015  Past Medical History: Past Medical History  Diagnosis Date  . Asthma   . CHF (congestive heart failure) ([place])   . Allergy   . Anxiety   . GERD (gastroesophageal reflux disease)   . Sleep apnea   . Hypertension   . Chronic kidney disease   . Oxygen deficiency     Encounter Details:     CNP Questionnaire - 04/03/15 1754    Patient Demographics   Is this a new or existing patient? Existing   Patient is considered a/an Not Applicable   Race Caucasian/White   Patient Assistance   Location of Patient Assistance Not Applicable   Patient's financial/insurance status Medicaid   Uninsured Patient No   Interventions Follow-up/Education/Support provided after completed appt.   Patient referred to apply for the following financial assistance Medicaid   Food insecurities addressed Not Applicable   Transportation assistance No   Assistance securing medications No   Educational health offerings Cardiac disease;Safety   Encounter Details   Was an Emergency Department visit averted? Not Applicable   Does patient have a medical provider? Yes   Patient referred to Follow up with established PCP   Was a mental health screening completed? (GAINS tool) No   Does patient have dental issues? No   Since previous encounter, have you referred patient for abnormal blood pressure that resulted in a new diagnosis or medication change? No   Since previous encounter, have you referred patient for abnormal blood glucose that resulted in a new diagnosis or medication change? No   For Abstraction Use Only   Does patient have insurance? No    Nurse  Saw  Clint in hallway  Near door  At entrance ,ask if  She  Felt  Better today  Stated  No ,but  She  Was being  Put  Out  Today  And that she  Didn't do anything . Nurse listened  and encouraged  Client to follow up on her health issues  To prevent hospitalization.Ask if  She  Had  Plans  She  stated  She had a little  Savings  But would  needto  Use it  To  Find  Somewhere to stay and start over again.  Client was discharged  By  [facility]  But she left without letting staff know as they  were trying to assist client with arrangements  For departure.

## 2015-04-14 IMAGING — CR DG CHEST 2V
3 series · 3 of 3 positions shown · non-contrast
Comparison: 08/08/2012

CLINICAL DATA: Shortness of breath, leg swelling

CHEST - 2 VIEW

[w chest lat (1 of 2)]
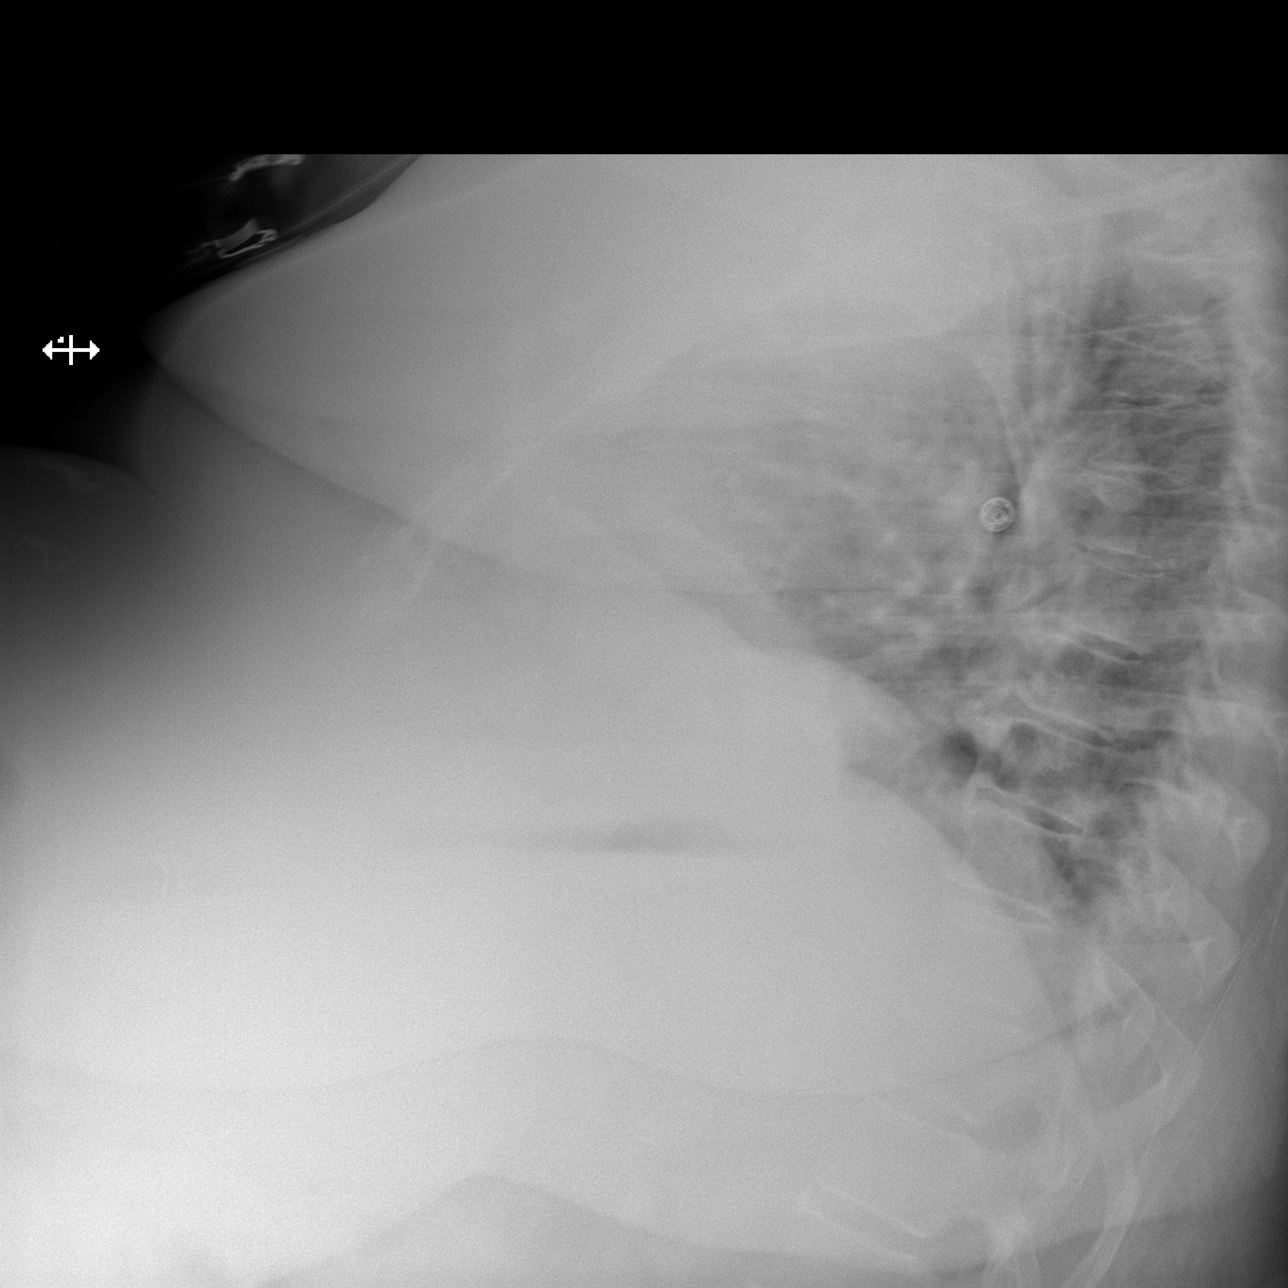

[w chest lat (2 of 2)]
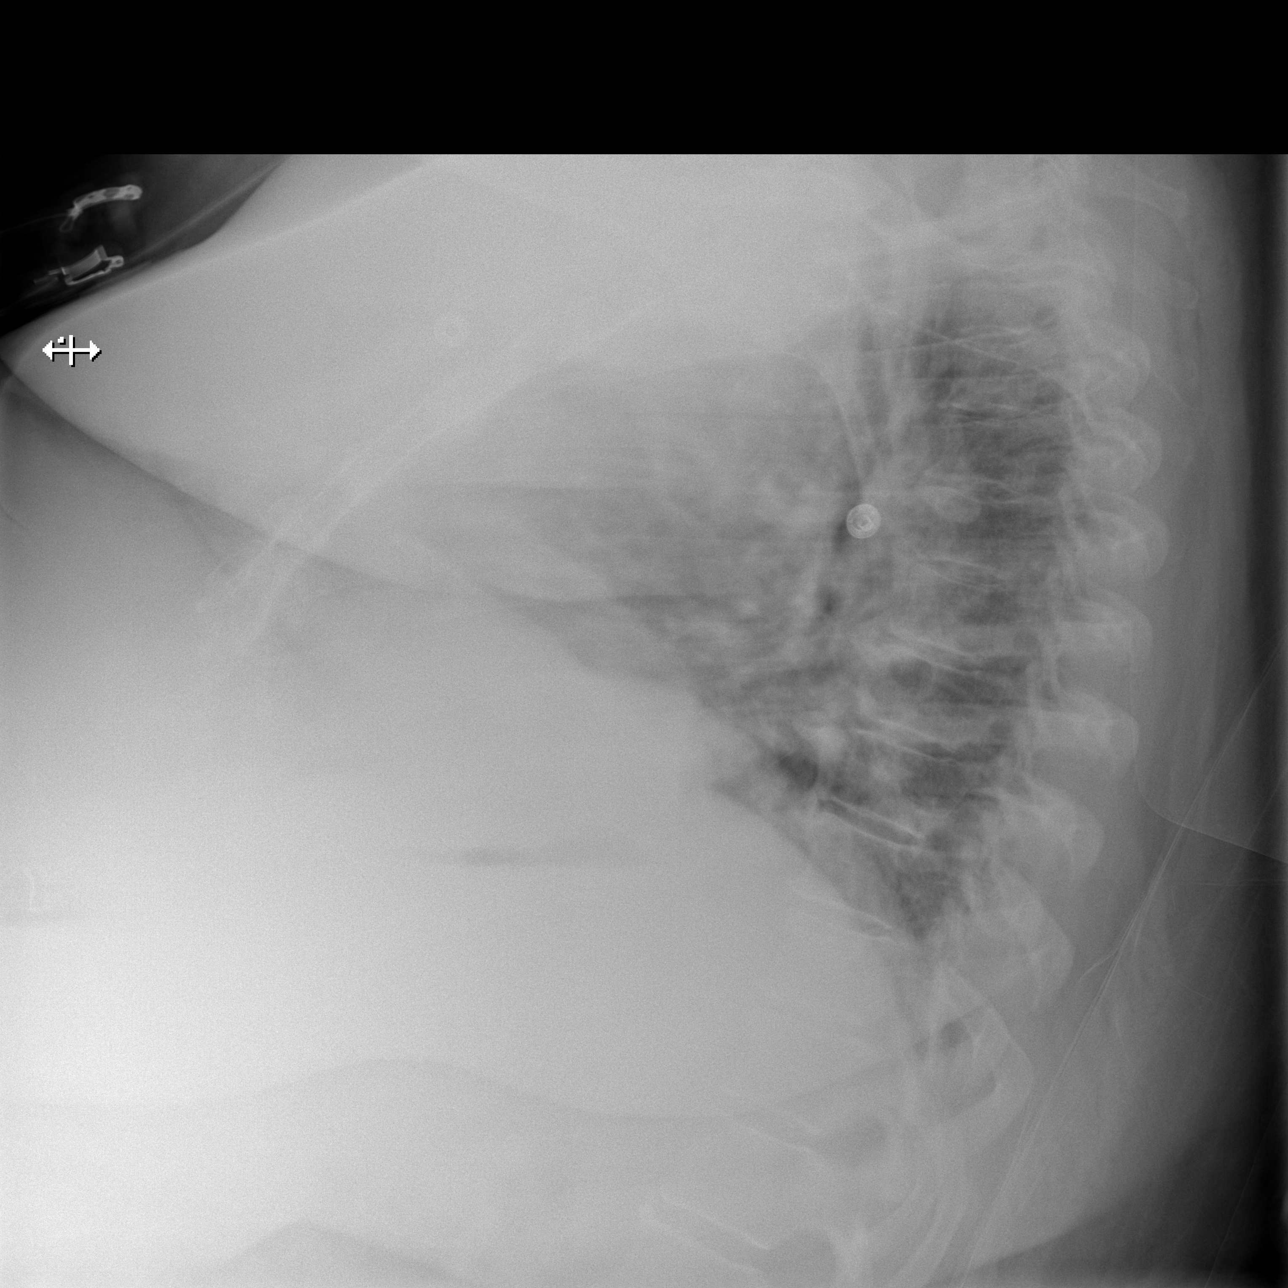

[x chest ap]
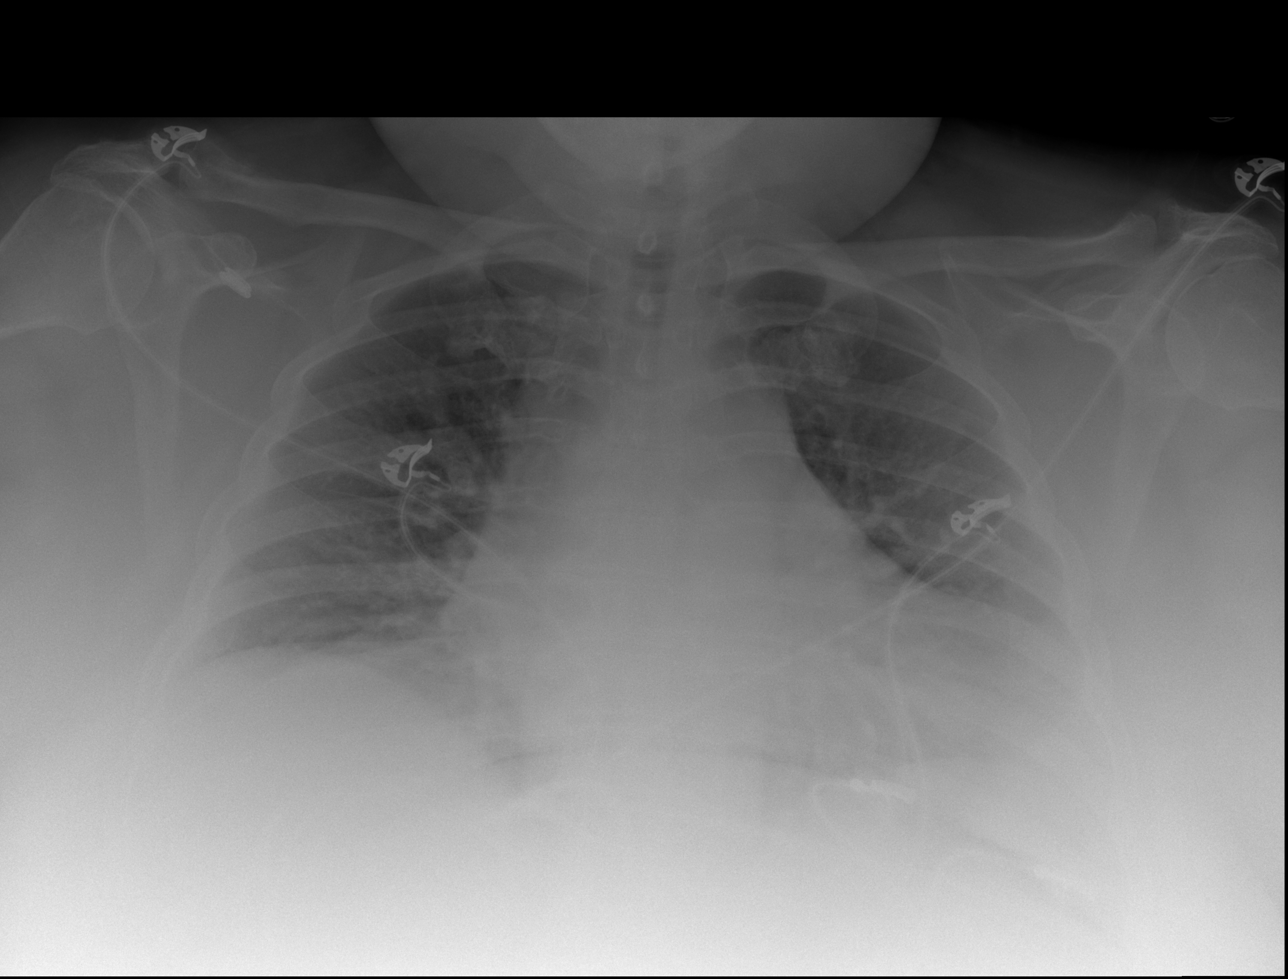

[3 of 3 positions shown; findings below may reference images not displayed]

FINDINGS: Low lung volumes with vascular crowding.  No frank
interstitial edema. No pleural effusion or pneumothorax.

Mild cardiomegaly.

Mild degenerative changes of the visualized thoracolumbar spine.
IMPRESSION: Low lung volumes with vascular crowding.  No frank interstitial
edema.

## 2015-06-08 ENCOUNTER — Inpatient Hospital Stay (HOSPITAL_COMMUNITY)
Admission: EM | Admit: 2015-06-08 | Discharge: 2015-06-11 | DRG: 202 | Disposition: A | Discharge status: Home / Self Care | Payer: Medicaid Other | Attending: Internal Medicine | Admitting: Internal Medicine

## 2015-06-08 ENCOUNTER — Encounter (HOSPITAL_COMMUNITY): Payer: Self-pay

## 2015-06-08 DIAGNOSIS — R51 Headache: Secondary | ICD-10-CM | POA: Diagnosis present

## 2015-06-08 DIAGNOSIS — N183 Chronic kidney disease, stage 3 unspecified: Secondary | ICD-10-CM | POA: Diagnosis present

## 2015-06-08 DIAGNOSIS — Z9119 Patient's noncompliance with other medical treatment and regimen: Secondary | ICD-10-CM

## 2015-06-08 DIAGNOSIS — Z880 Allergy status to penicillin: Secondary | ICD-10-CM

## 2015-06-08 DIAGNOSIS — Q828 Other specified congenital malformations of skin: Secondary | ICD-10-CM

## 2015-06-08 DIAGNOSIS — R32 Unspecified urinary incontinence: Secondary | ICD-10-CM | POA: Diagnosis present

## 2015-06-08 DIAGNOSIS — I5033 Acute on chronic diastolic (congestive) heart failure: Secondary | ICD-10-CM | POA: Diagnosis present

## 2015-06-08 DIAGNOSIS — I50810 Right heart failure, unspecified: Secondary | ICD-10-CM

## 2015-06-08 DIAGNOSIS — Z833 Family history of diabetes mellitus: Secondary | ICD-10-CM

## 2015-06-08 DIAGNOSIS — R001 Bradycardia, unspecified: Secondary | ICD-10-CM | POA: Diagnosis present

## 2015-06-08 DIAGNOSIS — Z825 Family history of asthma and other chronic lower respiratory diseases: Secondary | ICD-10-CM

## 2015-06-08 DIAGNOSIS — M79606 Pain in leg, unspecified: Secondary | ICD-10-CM | POA: Diagnosis present

## 2015-06-08 DIAGNOSIS — E785 Hyperlipidemia, unspecified: Secondary | ICD-10-CM | POA: Diagnosis present

## 2015-06-08 DIAGNOSIS — F39 Unspecified mood [affective] disorder: Secondary | ICD-10-CM | POA: Diagnosis present

## 2015-06-08 DIAGNOSIS — J9621 Acute and chronic respiratory failure with hypoxia: Secondary | ICD-10-CM | POA: Diagnosis present

## 2015-06-08 DIAGNOSIS — G8929 Other chronic pain: Secondary | ICD-10-CM | POA: Diagnosis present

## 2015-06-08 DIAGNOSIS — Z79899 Other long term (current) drug therapy: Secondary | ICD-10-CM

## 2015-06-08 DIAGNOSIS — J45901 Unspecified asthma with (acute) exacerbation: Principal | ICD-10-CM | POA: Diagnosis present

## 2015-06-08 DIAGNOSIS — I129 Hypertensive chronic kidney disease with stage 1 through stage 4 chronic kidney disease, or unspecified chronic kidney disease: Secondary | ICD-10-CM | POA: Diagnosis present

## 2015-06-08 DIAGNOSIS — Z823 Family history of stroke: Secondary | ICD-10-CM

## 2015-06-08 DIAGNOSIS — G4733 Obstructive sleep apnea (adult) (pediatric): Secondary | ICD-10-CM

## 2015-06-08 DIAGNOSIS — K219 Gastro-esophageal reflux disease without esophagitis: Secondary | ICD-10-CM | POA: Diagnosis present

## 2015-06-08 DIAGNOSIS — M109 Gout, unspecified: Secondary | ICD-10-CM | POA: Diagnosis present

## 2015-06-08 DIAGNOSIS — M1990 Unspecified osteoarthritis, unspecified site: Secondary | ICD-10-CM | POA: Diagnosis present

## 2015-06-08 DIAGNOSIS — R21 Rash and other nonspecific skin eruption: Secondary | ICD-10-CM | POA: Diagnosis present

## 2015-06-08 DIAGNOSIS — Z9109 Other allergy status, other than to drugs and biological substances: Secondary | ICD-10-CM

## 2015-06-08 DIAGNOSIS — Z881 Allergy status to other antibiotic agents status: Secondary | ICD-10-CM

## 2015-06-08 DIAGNOSIS — R0902 Hypoxemia: Secondary | ICD-10-CM

## 2015-06-08 DIAGNOSIS — F419 Anxiety disorder, unspecified: Secondary | ICD-10-CM | POA: Diagnosis present

## 2015-06-08 DIAGNOSIS — E662 Morbid (severe) obesity with alveolar hypoventilation: Secondary | ICD-10-CM | POA: Diagnosis present

## 2015-06-08 DIAGNOSIS — Z886 Allergy status to analgesic agent status: Secondary | ICD-10-CM

## 2015-06-08 DIAGNOSIS — Z9981 Dependence on supplemental oxygen: Secondary | ICD-10-CM

## 2015-06-08 DIAGNOSIS — Z8249 Family history of ischemic heart disease and other diseases of the circulatory system: Secondary | ICD-10-CM

## 2015-06-08 DIAGNOSIS — Z6841 Body Mass Index (BMI) 40.0 and over, adult: Secondary | ICD-10-CM

## 2015-06-08 DIAGNOSIS — J9622 Acute and chronic respiratory failure with hypercapnia: Secondary | ICD-10-CM

## 2015-06-08 DIAGNOSIS — Z882 Allergy status to sulfonamides status: Secondary | ICD-10-CM

## 2015-06-08 DIAGNOSIS — R0602 Shortness of breath: Secondary | ICD-10-CM

## 2015-06-08 DIAGNOSIS — Z590 Homelessness: Secondary | ICD-10-CM

## 2015-06-08 MED ORDER — ALBUTEROL SULFATE (2.5 MG/3ML) 0.083% IN NEBU
5.0000 mg | INHALATION_SOLUTION | Freq: Once | RESPIRATORY_TRACT | Status: AC
  Administered 2015-06-09: 5 mg via RESPIRATORY_TRACT
  Filled 2015-06-08: qty 6

## 2015-06-08 NOTE — ED Notes (Addendum)
Having dyspnea for the past 3 days.  Taken albuterol inhaler but still short of breath. Having leg pain along with the dyspnea.  Rated 10/10 with breathing  Had a O2 Saturation of 89%.  Put on 2L O2 and it increased to 93%.

## 2015-06-09 ENCOUNTER — Emergency Department (HOSPITAL_COMMUNITY): Payer: Medicaid Other

## 2015-06-09 ENCOUNTER — Encounter (HOSPITAL_COMMUNITY): Payer: Self-pay | Admitting: Family Medicine

## 2015-06-09 DIAGNOSIS — J9621 Acute and chronic respiratory failure with hypoxia: Secondary | ICD-10-CM | POA: Diagnosis present

## 2015-06-09 DIAGNOSIS — Z9981 Dependence on supplemental oxygen: Secondary | ICD-10-CM | POA: Diagnosis not present

## 2015-06-09 DIAGNOSIS — Z79899 Other long term (current) drug therapy: Secondary | ICD-10-CM | POA: Diagnosis not present

## 2015-06-09 DIAGNOSIS — Z825 Family history of asthma and other chronic lower respiratory diseases: Secondary | ICD-10-CM | POA: Diagnosis not present

## 2015-06-09 DIAGNOSIS — Z882 Allergy status to sulfonamides status: Secondary | ICD-10-CM | POA: Diagnosis not present

## 2015-06-09 DIAGNOSIS — Z590 Homelessness: Secondary | ICD-10-CM | POA: Diagnosis not present

## 2015-06-09 DIAGNOSIS — Z9109 Other allergy status, other than to drugs and biological substances: Secondary | ICD-10-CM | POA: Diagnosis not present

## 2015-06-09 DIAGNOSIS — J45901 Unspecified asthma with (acute) exacerbation: Secondary | ICD-10-CM | POA: Diagnosis present

## 2015-06-09 DIAGNOSIS — R001 Bradycardia, unspecified: Secondary | ICD-10-CM | POA: Diagnosis present

## 2015-06-09 DIAGNOSIS — I129 Hypertensive chronic kidney disease with stage 1 through stage 4 chronic kidney disease, or unspecified chronic kidney disease: Secondary | ICD-10-CM | POA: Diagnosis present

## 2015-06-09 DIAGNOSIS — F419 Anxiety disorder, unspecified: Secondary | ICD-10-CM | POA: Diagnosis present

## 2015-06-09 DIAGNOSIS — R0602 Shortness of breath: Secondary | ICD-10-CM | POA: Diagnosis present

## 2015-06-09 DIAGNOSIS — E785 Hyperlipidemia, unspecified: Secondary | ICD-10-CM | POA: Diagnosis present

## 2015-06-09 DIAGNOSIS — R32 Unspecified urinary incontinence: Secondary | ICD-10-CM | POA: Diagnosis present

## 2015-06-09 DIAGNOSIS — Z881 Allergy status to other antibiotic agents status: Secondary | ICD-10-CM | POA: Diagnosis not present

## 2015-06-09 DIAGNOSIS — I5033 Acute on chronic diastolic (congestive) heart failure: Secondary | ICD-10-CM | POA: Diagnosis present

## 2015-06-09 DIAGNOSIS — M79606 Pain in leg, unspecified: Secondary | ICD-10-CM | POA: Diagnosis present

## 2015-06-09 DIAGNOSIS — Z9119 Patient's noncompliance with other medical treatment and regimen: Secondary | ICD-10-CM | POA: Diagnosis not present

## 2015-06-09 DIAGNOSIS — R51 Headache: Secondary | ICD-10-CM | POA: Diagnosis present

## 2015-06-09 DIAGNOSIS — G8929 Other chronic pain: Secondary | ICD-10-CM | POA: Diagnosis present

## 2015-06-09 DIAGNOSIS — M109 Gout, unspecified: Secondary | ICD-10-CM | POA: Diagnosis present

## 2015-06-09 DIAGNOSIS — R21 Rash and other nonspecific skin eruption: Secondary | ICD-10-CM | POA: Diagnosis present

## 2015-06-09 DIAGNOSIS — Z886 Allergy status to analgesic agent status: Secondary | ICD-10-CM | POA: Diagnosis not present

## 2015-06-09 DIAGNOSIS — E662 Morbid (severe) obesity with alveolar hypoventilation: Secondary | ICD-10-CM | POA: Diagnosis present

## 2015-06-09 DIAGNOSIS — Z833 Family history of diabetes mellitus: Secondary | ICD-10-CM | POA: Diagnosis not present

## 2015-06-09 DIAGNOSIS — M1990 Unspecified osteoarthritis, unspecified site: Secondary | ICD-10-CM | POA: Diagnosis present

## 2015-06-09 DIAGNOSIS — Q828 Other specified congenital malformations of skin: Secondary | ICD-10-CM | POA: Diagnosis not present

## 2015-06-09 DIAGNOSIS — Z880 Allergy status to penicillin: Secondary | ICD-10-CM | POA: Diagnosis not present

## 2015-06-09 DIAGNOSIS — N183 Chronic kidney disease, stage 3 (moderate): Secondary | ICD-10-CM | POA: Diagnosis present

## 2015-06-09 DIAGNOSIS — Z823 Family history of stroke: Secondary | ICD-10-CM | POA: Diagnosis not present

## 2015-06-09 DIAGNOSIS — Z8249 Family history of ischemic heart disease and other diseases of the circulatory system: Secondary | ICD-10-CM | POA: Diagnosis not present

## 2015-06-09 DIAGNOSIS — F39 Unspecified mood [affective] disorder: Secondary | ICD-10-CM | POA: Diagnosis present

## 2015-06-09 DIAGNOSIS — Z6841 Body Mass Index (BMI) 40.0 and over, adult: Secondary | ICD-10-CM | POA: Diagnosis not present

## 2015-06-09 DIAGNOSIS — K219 Gastro-esophageal reflux disease without esophagitis: Secondary | ICD-10-CM | POA: Diagnosis present

## 2015-06-09 LAB — COMPREHENSIVE METABOLIC PANEL
ALT: 12 U/L — AB (ref 14–54)
AST: 13 U/L — ABNORMAL LOW (ref 15–41)
Albumin: 3.5 g/dL (ref 3.5–5.0)
Alkaline Phosphatase: 59 U/L (ref 38–126)
Anion gap: 7 (ref 5–15)
BUN: 21 mg/dL — ABNORMAL HIGH (ref 6–20)
CHLORIDE: 104 mmol/L (ref 101–111)
CO2: 29 mmol/L (ref 22–32)
CREATININE: 1.2 mg/dL — AB (ref 0.44–1.00)
Calcium: 8.1 mg/dL — ABNORMAL LOW (ref 8.9–10.3)
GFR, EST AFRICAN AMERICAN: 60 mL/min — AB (ref >60)
GFR, EST NON AFRICAN AMERICAN: 51 mL/min — AB (ref >60)
Glucose, Bld: 102 mg/dL — ABNORMAL HIGH (ref 65–99)
POTASSIUM: 4.5 mmol/L (ref 3.5–5.1)
Sodium: 140 mmol/L (ref 135–145)
TOTAL PROTEIN: 7.1 g/dL (ref 6.5–8.1)
Total Bilirubin: 0.7 mg/dL (ref 0.3–1.2)

## 2015-06-09 LAB — TROPONIN I
Troponin I: <0.03 ng/mL (ref <0.031)
Troponin I: <0.03 ng/mL (ref <0.031)

## 2015-06-09 LAB — CBC WITH DIFFERENTIAL/PLATELET
BASOS ABS: 0 10*3/uL (ref 0.0–0.1)
Basophils Relative: 0 %
EOS ABS: 0.3 10*3/uL (ref 0.0–0.7)
Eosinophils Relative: 3 %
HCT: 39.5 % (ref 36.0–46.0)
HEMOGLOBIN: 12.1 g/dL (ref 12.0–15.0)
LYMPHS ABS: 2 10*3/uL (ref 0.7–4.0)
Lymphocytes Relative: 21 %
MCH: 27.9 pg (ref 26.0–34.0)
MCHC: 30.6 g/dL (ref 30.0–36.0)
MCV: 91 fL (ref 78.0–100.0)
Monocytes Absolute: 0.4 10*3/uL (ref 0.1–1.0)
Monocytes Relative: 4 %
NEUTROS PCT: 72 %
Neutro Abs: 6.8 10*3/uL (ref 1.7–7.7)
Platelets: 210 10*3/uL (ref 150–400)
RBC: 4.34 MIL/uL (ref 3.87–5.11)
RDW: 15 % (ref 11.5–15.5)
WBC: 9.6 10*3/uL (ref 4.0–10.5)

## 2015-06-09 LAB — BRAIN NATRIURETIC PEPTIDE: B NATRIURETIC PEPTIDE 5: 149.6 pg/mL — AB (ref 0.0–100.0)

## 2015-06-09 LAB — MRSA PCR SCREENING: MRSA by PCR: POSITIVE — AB

## 2015-06-09 LAB — I-STAT TROPONIN, ED: TROPONIN I, POC: 0 ng/mL (ref 0.00–0.08)

## 2015-06-09 MED ORDER — FUROSEMIDE 10 MG/ML IJ SOLN: 40.0000 mg | Freq: Every day | INTRAMUSCULAR | Status: DC

## 2015-06-09 MED ORDER — FOLIC ACID 1 MG PO TABS
1.0000 mg | ORAL_TABLET | Freq: Every day | ORAL | Status: DC
  Administered 2015-06-09 – 2015-06-11 (×3): 1 mg via ORAL
  Filled 2015-06-09 (×3): qty 1

## 2015-06-09 MED ORDER — GABAPENTIN 100 MG PO CAPS
100.0000 mg | ORAL_CAPSULE | Freq: Two times a day (BID) | ORAL | Status: DC
  Administered 2015-06-09 – 2015-06-11 (×5): 100 mg via ORAL
  Filled 2015-06-09 (×6): qty 1

## 2015-06-09 MED ORDER — ACETAMINOPHEN 325 MG PO TABS
650.0000 mg | ORAL_TABLET | Freq: Four times a day (QID) | ORAL | Status: DC | PRN
  Administered 2015-06-09 – 2015-06-10 (×3): 650 mg via ORAL
  Filled 2015-06-09 (×3): qty 2

## 2015-06-09 MED ORDER — ISOSORBIDE MONONITRATE ER 30 MG PO TB24
30.0000 mg | ORAL_TABLET | Freq: Every day | ORAL | Status: DC
  Administered 2015-06-09 – 2015-06-11 (×3): 30 mg via ORAL
  Filled 2015-06-09 (×3): qty 1

## 2015-06-09 MED ORDER — LISINOPRIL 40 MG PO TABS
40.0000 mg | ORAL_TABLET | Freq: Every day | ORAL | Status: DC
  Administered 2015-06-09: 40 mg via ORAL
  Filled 2015-06-09 (×2): qty 1

## 2015-06-09 MED ORDER — ALLOPURINOL 100 MG PO TABS
100.0000 mg | ORAL_TABLET | Freq: Every morning | ORAL | Status: DC
  Administered 2015-06-09 – 2015-06-11 (×3): 100 mg via ORAL
  Filled 2015-06-09 (×3): qty 1

## 2015-06-09 MED ORDER — IPRATROPIUM-ALBUTEROL 0.5-2.5 (3) MG/3ML IN SOLN
3.0000 mL | Freq: Three times a day (TID) | RESPIRATORY_TRACT | Status: DC
  Administered 2015-06-09 – 2015-06-11 (×5): 3 mL via RESPIRATORY_TRACT
  Filled 2015-06-09 (×5): qty 3

## 2015-06-09 MED ORDER — FUROSEMIDE 10 MG/ML IJ SOLN
40.0000 mg | Freq: Once | INTRAMUSCULAR | Status: AC
  Administered 2015-06-09: 40 mg via INTRAVENOUS
  Filled 2015-06-09: qty 4

## 2015-06-09 MED ORDER — ESCITALOPRAM OXALATE 10 MG PO TABS
10.0000 mg | ORAL_TABLET | Freq: Every day | ORAL | Status: DC
  Administered 2015-06-09 – 2015-06-11 (×3): 10 mg via ORAL
  Filled 2015-06-09 (×3): qty 1

## 2015-06-09 MED ORDER — ENOXAPARIN SODIUM 100 MG/ML ~~LOC~~ SOLN
90.0000 mg | SUBCUTANEOUS | Status: DC
  Filled 2015-06-09 (×3): qty 1

## 2015-06-09 MED ORDER — DARIFENACIN HYDROBROMIDE ER 7.5 MG PO TB24
7.5000 mg | ORAL_TABLET | Freq: Every day | ORAL | Status: DC
  Administered 2015-06-09 – 2015-06-11 (×3): 7.5 mg via ORAL
  Filled 2015-06-09 (×3): qty 1

## 2015-06-09 MED ORDER — CLONIDINE HCL 0.2 MG PO TABS
0.2000 mg | ORAL_TABLET | Freq: Two times a day (BID) | ORAL | Status: DC
  Administered 2015-06-09 – 2015-06-11 (×5): 0.2 mg via ORAL
  Filled 2015-06-09 (×6): qty 1

## 2015-06-09 MED ORDER — BENZONATATE 100 MG PO CAPS
100.0000 mg | ORAL_CAPSULE | Freq: Three times a day (TID) | ORAL | Status: DC | PRN
  Administered 2015-06-09 – 2015-06-10 (×3): 100 mg via ORAL
  Filled 2015-06-09 (×3): qty 1

## 2015-06-09 MED ORDER — MOMETASONE FURO-FORMOTEROL FUM 200-5 MCG/ACT IN AERO
2.0000 | INHALATION_SPRAY | Freq: Two times a day (BID) | RESPIRATORY_TRACT | Status: DC
  Administered 2015-06-09 – 2015-06-11 (×3): 2 via RESPIRATORY_TRACT
  Filled 2015-06-09: qty 8.8

## 2015-06-09 MED ORDER — PANTOPRAZOLE SODIUM 40 MG PO TBEC
40.0000 mg | DELAYED_RELEASE_TABLET | Freq: Every morning | ORAL | Status: DC
  Administered 2015-06-09 – 2015-06-11 (×3): 40 mg via ORAL
  Filled 2015-06-09 (×3): qty 1

## 2015-06-09 MED ORDER — ALBUTEROL SULFATE (2.5 MG/3ML) 0.083% IN NEBU: 2.5000 mg | INHALATION_SOLUTION | RESPIRATORY_TRACT | Status: DC | PRN

## 2015-06-09 MED ORDER — HYDROCERIN EX CREA
1.0000 "application " | TOPICAL_CREAM | Freq: Three times a day (TID) | CUTANEOUS | Status: DC
  Administered 2015-06-09 – 2015-06-11 (×6): 1 via TOPICAL
  Filled 2015-06-09: qty 113

## 2015-06-09 MED ORDER — CHLORHEXIDINE GLUCONATE CLOTH 2 % EX PADS
6.0000 | MEDICATED_PAD | Freq: Every day | CUTANEOUS | Status: DC
  Administered 2015-06-09 – 2015-06-11 (×3): 6 via TOPICAL

## 2015-06-09 MED ORDER — AMMONIUM LACTATE 12 % EX LOTN
1.0000 "application " | TOPICAL_LOTION | Freq: Every day | CUTANEOUS | Status: DC | PRN
  Filled 2015-06-09: qty 400

## 2015-06-09 MED ORDER — KETOROLAC TROMETHAMINE 15 MG/ML IJ SOLN
15.0000 mg | Freq: Four times a day (QID) | INTRAMUSCULAR | Status: DC | PRN
  Administered 2015-06-09: 15 mg via INTRAVENOUS
  Filled 2015-06-09 (×2): qty 1

## 2015-06-09 MED ORDER — SODIUM CHLORIDE 0.9% FLUSH
3.0000 mL | Freq: Two times a day (BID) | INTRAVENOUS | Status: DC
  Administered 2015-06-09 – 2015-06-11 (×5): 3 mL via INTRAVENOUS

## 2015-06-09 MED ORDER — ATORVASTATIN CALCIUM 20 MG PO TABS
20.0000 mg | ORAL_TABLET | Freq: Every morning | ORAL | Status: DC
  Administered 2015-06-09 – 2015-06-11 (×3): 20 mg via ORAL
  Filled 2015-06-09 (×3): qty 1

## 2015-06-09 MED ORDER — CLINDAMYCIN HCL 300 MG PO CAPS
600.0000 mg | ORAL_CAPSULE | Freq: Three times a day (TID) | ORAL | Status: DC
  Administered 2015-06-09 – 2015-06-11 (×7): 600 mg via ORAL
  Filled 2015-06-09 (×9): qty 2

## 2015-06-09 MED ORDER — IPRATROPIUM-ALBUTEROL 0.5-2.5 (3) MG/3ML IN SOLN
3.0000 mL | Freq: Four times a day (QID) | RESPIRATORY_TRACT | Status: DC
  Administered 2015-06-09: 3 mL via RESPIRATORY_TRACT
  Filled 2015-06-09: qty 3

## 2015-06-09 MED ORDER — ALPRAZOLAM 1 MG PO TABS: 1.0000 mg | ORAL_TABLET | Freq: Every evening | ORAL | Status: DC | PRN

## 2015-06-09 MED ORDER — ASPIRIN 325 MG PO TABS
325.0000 mg | ORAL_TABLET | Freq: Every morning | ORAL | Status: DC
  Administered 2015-06-09 – 2015-06-11 (×3): 325 mg via ORAL
  Filled 2015-06-09 (×3): qty 1

## 2015-06-09 MED ORDER — MUPIROCIN 2 % EX OINT
1.0000 "application " | TOPICAL_OINTMENT | Freq: Two times a day (BID) | CUTANEOUS | Status: DC
  Administered 2015-06-09 – 2015-06-11 (×5): 1 via NASAL
  Filled 2015-06-09 (×2): qty 22

## 2015-06-09 MED ORDER — TOPIRAMATE 100 MG PO TABS
100.0000 mg | ORAL_TABLET | Freq: Every day | ORAL | Status: DC
  Administered 2015-06-09 – 2015-06-10 (×2): 100 mg via ORAL
  Filled 2015-06-09 (×3): qty 1

## 2015-06-09 NOTE — ED Provider Notes (Signed)
CSN: [id number]     Arrival date & time 06/08/15  2325 History  By signing my name below, I, Kara Vincent, attest that this documentation has been prepared under the direction and in the presence of Kara Speak, MD . Electronically Signed: Rowan Vincent, Scribe. 06/09/2015. 2:58 AM.   Chief Complaint  Patient presents with  . Shortness of Breath   The history is provided by the patient. No language interpreter was used.   HPI Comments:  Kara Vincent is a 50 y.o. female with PMHx of asthma, HTN, HLD, and CHF who presents to the Emergency Department complaining of persistent shortness of breath for the past three days, worsening tonight. Pt reports associated painful, red, raised rash on chest and right arm, swelling in legs, right leg pain, and chest pain. Pt states she cannot walk due to pain. She has treated with albuterol inhaler at home with no relief. Pt is on 3L [place] O2 continuously at home; she states she doesn't have her oxygen machine.   Past Medical History  Diagnosis Date  . Asthma   . GERD (gastroesophageal reflux disease)   . Hypertension   . Homelessness   . Low back pain   . 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 Arthritis   . Gout   . Hyperlipemia   . Morbid obesity ([place])     uses motor wheel chair  . MRSA (methicillin resistant Staphylococcus aureus)     states about a year ago  . CHF (congestive heart failure) ([place])   . Chronic abdominal pain   . Hidradenitis   . Chronic pain     "all over"  . Renal insufficiency   . On home O2     3L N/C O2 continuously  . OSA (obstructive sleep apnea)     non-compliant with CPAP   Past Surgical History  Procedure Laterality Date  . Tubal ligation    . Cesarean section    . Back surgery     Family History  Problem Relation Age of Onset  . Asthma Mother   . Diabetes Father   . Heart disease Father   . Stroke Father    Social History  Substance Use Topics  . Smoking status: Former Smoker     Types: Cigarettes    Quit date: 03/25/2003  . Smokeless tobacco: Former Systems developer    Quit date: 05/27/1978  . Alcohol Use: No   OB History    No data available     Review of Systems  Respiratory: Positive for shortness of breath.   Cardiovascular: Positive for chest pain and leg swelling.  Musculoskeletal: Positive for arthralgias (right leg).  Skin: Positive for rash.  All other systems reviewed and are negative.   Allergies  Ibuprofen; Adhesive; Azithromycin; Doxycycline; Penicillins; Sulfa antibiotics; and Sulfamethoxazole-trimethoprim  Home Medications   Prior to Admission medications   Medication Sig Start Date End Date Taking? Authorizing Provider  albuterol-ipratropium (COMBIVENT) 18-103 MCG/ACT inhaler Inhale 2 puffs into the lungs every 4 (four) hours as needed for wheezing or shortness of breath. 11/21/14   Debbe Odea, MD  allopurinol (ZYLOPRIM) 100 MG tablet Take 1 tablet (100 mg total) by mouth every morning. 01/22/14   Donne Hazel, MD  ALPRAZolam Duanne Moron) 1 MG tablet Take 1 mg by mouth at bedtime as needed for anxiety (anxiety).    Historical Provider, MD  ammonium lactate (AMLACTIN) 12 % cream Apply 1 g topically daily as needed for  dry skin. 04/22/14   Linton Flemings, MD  aspirin 325 MG tablet Take 325 mg by mouth every morning.     Historical Provider, MD  atorvastatin (LIPITOR) 20 MG tablet Take 1 tablet (20 mg total) by mouth every morning. 01/22/14   Donne Hazel, MD  benzonatate (TESSALON) 100 MG capsule Take 1 capsule (100 mg total) by mouth every 8 (eight) hours. Patient taking differently: Take 100 mg by mouth 3 (three) times daily as needed for cough.  12/01/14   April Palumbo, MD  budesonide-formoterol [facility]) 160-4.5 MCG/ACT inhaler Inhale 2 puffs into the lungs 2 (two) times daily. 11/21/14   Debbe Odea, MD  cloNIDine (CATAPRES) 0.2 MG tablet Take 0.2 mg by mouth 2 (two) times daily.    Historical Provider, MD  clotrimazole (LOTRIMIN) 1 % cream Apply to  affected area 2 times daily Patient taking differently: Apply 1 application topically 2 (two) times daily.  06/20/14   Charlann Lange, PA-C  diphenhydrAMINE (BENADRYL) 25 mg capsule Take 25 mg by mouth every 6 (six) hours as needed for itching or allergies (allergies).     Historical Provider, MD  Emollient (EUCERIN) lotion Apply 5 mLs topically 2 (two) times daily as needed for dry skin. Patient taking differently: Apply 5 mLs topically 3 (three) times daily.  04/22/14   Linton Flemings, MD  folic acid (FOLVITE) 1 MG tablet Take 1 tablet (1 mg total) by mouth daily. 01/22/14   Donne Hazel, MD  furosemide (LASIX) 40 MG tablet Take 1 tablet (40 mg total) by mouth 2 (two) times daily. 01/22/14   Donne Hazel, MD  guaiFENesin (MUCINEX) 600 MG 12 hr tablet Take 1 tablet (600 mg total) by mouth 2 (two) times daily. Patient not taking: Reported on 12/12/2014 11/21/14   Debbe Odea, MD  hydrOXYzine (ATARAX/VISTARIL) 25 MG tablet Take 25 mg by mouth 3 (three) times daily as needed for anxiety or itching (anxiety and itching).     Historical Provider, MD  ketoconazole (NIZORAL) 2 % shampoo Apply 1 application topically daily as needed for irritation (irritation).     Historical Provider, MD  ketoconazole (NIZORAL) 200 MG tablet Take 200 mg by mouth every morning.     Historical Provider, MD  lisinopril (PRINIVIL,ZESTRIL) 40 MG tablet Take 40 mg by mouth daily.    Historical Provider, MD  Multiple Vitamin (MULTIVITAMIN WITH MINERALS) TABS tablet Take 1 tablet by mouth daily. 01/22/14   Donne Hazel, MD  pantoprazole (PROTONIX) 40 MG tablet Take 1 tablet (40 mg total) by mouth every morning. 01/22/14   Donne Hazel, MD  predniSONE (DELTASONE) 20 MG tablet Take 1 tablet (20 mg total) by mouth daily with breakfast. Patient not taking: Reported on 12/13/2014 11/21/14   Debbe Odea, MD  solifenacin (VESICARE) 5 MG tablet Take 5 mg by mouth every morning.     Historical Provider, MD  vitamin B-12 (CYANOCOBALAMIN) 1000  MCG tablet Take 1 tablet (1,000 mcg total) by mouth every morning. 01/22/14   Donne Hazel, MD  vitamin E 100 UNIT capsule Take 1 capsule (100 Units total) by mouth every morning. 01/22/14   Donne Hazel, MD   BP 159/97 mmHg  Pulse 63  Temp(Src) 98.2 F (36.8 C) (Oral)  SpO2 95%  LMP 03/24/2009 Physical Exam  Constitutional: She appears well-developed and well-nourished. No distress.  HENT:  Head: Normocephalic and atraumatic.  Mouth/Throat: Oropharynx is clear and moist. No oropharyngeal exudate.  Eyes: Conjunctivae and EOM are normal. Pupils  are equal, round, and reactive to light. Right eye exhibits no discharge. Left eye exhibits no discharge. No scleral icterus.  Neck: Normal range of motion. Neck supple. No JVD present. No thyromegaly present.  Cardiovascular: Normal rate, regular rhythm, normal heart sounds and intact distal pulses.  Exam reveals no gallop and no friction rub.   No murmur heard. Pulmonary/Chest: Effort normal and breath sounds normal. No respiratory distress. She has no wheezes. She has no rales.  Abdominal: Soft. Bowel sounds are normal. She exhibits no distension and no mass. There is no tenderness.  Abdomen is obese  Musculoskeletal: Normal range of motion. She exhibits edema. She exhibits no tenderness.  Lymphadenopathy:    She has no cervical adenopathy.  Neurological: She is alert. Coordination normal.  Skin: Skin is warm and dry. No rash noted. No erythema.  Psychiatric: She has a normal mood and affect. Her behavior is normal.  Nursing note and vitals reviewed.  ED Course  Procedures  DIAGNOSTIC STUDIES:  Oxygen Saturation is 95% on 3L/min [place], adequate by my interpretation.    COORDINATION OF CARE:  2:55 AM Will order blood work and administer lasix. Discussed treatment plan with pt at bedside and pt agreed to plan.  Labs Review Labs Reviewed  COMPREHENSIVE METABOLIC PANEL  CBC WITH DIFFERENTIAL/PLATELET  BRAIN NATRIURETIC PEPTIDE  I-STAT  TROPOININ, ED    Imaging Review Dg Chest 2 View  06/09/2015  CLINICAL DATA:  Dyspnea and cough 3 days EXAM: CHEST  2 VIEW COMPARISON:  12/13/2014 FINDINGS: There is unchanged cardiomegaly. No confluent airspace consolidation. No effusions. Pulmonary vasculature is grossly normal. Study limited by body habitus. IMPRESSION: Unchanged cardiomegaly.  No acute cardiopulmonary findings. Electronically Signed   By: Andreas Newport M.D.   On: 06/09/2015 01:29   I have personally reviewed and evaluated these images and lab results as part of my medical decision-making.   EKG Interpretation   Date/Time:  Saturday June 09 2015 00:07:44 EDT Ventricular Rate:  52 PR Interval:  156 QRS Duration: 95 QT Interval:  462 QTC Calculation: 430 R Axis:   69 Text Interpretation:  Sinus rhythm Low voltage, precordial leads Baseline  wander in lead(s) V6 Confirmed by Calysta Craigo  MD, Khalila Buechner ([id number]) on 06/09/2015  4:11:21 AM      MDM   Final diagnoses:  None    Patient is a 50 year old female with history of obesity, CHF. She presents for evaluation of difficulty breathing which has worsened over the past several days. She denies any fevers or chills or productive cough. She is normally on oxygen for congestive heart failure, however is no longer on this.  Her workup reveals no significant abnormalities. Her BNP is slightly elevated. She does have a oxygen requirement with O2 sats in the mid to upper 80s. This is corrected with oxygen. Due to her low oxygen saturations, high do not feel as though she would be appropriate for discharge. I've spoken with Dr. Loleta Books from the hospitalist service who agrees to admit.  I am uncertain as to the etiology of her hypoxia, however I suspect this is likely chronic and related to CHF and obesity.  I personally performed the services described in this documentation, which was scribed in my presence. The recorded information has been reviewed and is accurate.        Kara Speak, MD 06/09/15 [phone]

## 2015-06-09 NOTE — H&P (Signed)
History and Physical  Patient Name: Kara Vincent     [id number]    DOB: 1965-12-18    DOA: 06/08/2015 Referring physician: Trish Mage, MD PCP: Harvie Junior, MD      Chief Complaint: Dyspnea  HPI: Kara Vincent is a 50 y.o. 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  Had to move out of her housing recently (the [facility]) and has been living in a [employer].  She lost her CPAP, her nebulizers, her home O2, and her furosemide at that time, she tells me.  Since then, she has had worsening dyspnea, leg pain and swelling, so she came to the ED.  No fever, chills.  Chronic cough productive of yellowish sputum.  No wheezing.  In the ED, she was initially saturating well on room air and hemodynamically stable.  Na 140, K 4.5, Cr 1.2, WBC 9.6, afebrile, BNP 149, and TNI normal.  A chest x-ray was uninterpretable due to body habitus.  An ECG showed a sinus bradycardia without ST changes.  Albuterol was administered without relief and she received one dose of furosemide.  The patient's last admission was in August of last year.  She was admitted, Pulm was consulted, she was diuresed 11L, and discharged at 417lbs (current weight 398 lbs).     Review of Systems:  Pt complains of dyspnea, leg swelling, new leg rash and pain. Pt denies any fever, chills, new sputum, rigors, confusion, sick contacts, body aches.  All other systems negative except as just noted or noted in the history of present illness.  Allergies  Allergen Reactions  . Ibuprofen Hives, Shortness Of Breath and Palpitations  . Adhesive [Tape] Rash  . Azithromycin Hives and Rash  . Doxycycline Rash  . Penicillins Rash    Has patient had a PCN reaction causing immediate rash, facial/tongue/throat swelling, SOB or lightheadedness with  hypotension:Yes Has patient had a PCN reaction causing severe rash involving mucus membranes or skin necrosis:Yes Has patient had a PCN reaction that required hospitalization Yes Has patient had a PCN reaction occurring within the last 10 years:No If all of the above answers are "NO", then may proceed with Cephalosporin use.   . Sulfa Antibiotics Rash  . Sulfamethoxazole-Trimethoprim Rash    Prior to Admission medications   Medication Sig Start Date End Date Taking? Authorizing Provider  albuterol-ipratropium (COMBIVENT) 18-103 MCG/ACT inhaler Inhale 2 puffs into the lungs every 4 (four) hours as needed for wheezing or shortness of breath. 11/21/14   Debbe Odea, MD  allopurinol (ZYLOPRIM) 100 MG tablet Take 1 tablet (100 mg total) by mouth every morning. 01/22/14   Donne Hazel, MD  ALPRAZolam Duanne Moron) 1 MG tablet Take 1 mg by mouth at bedtime as needed for anxiety (anxiety).    Historical Provider, MD  ammonium lactate (AMLACTIN) 12 % cream Apply 1 g topically daily as needed for dry skin. 04/22/14   Linton Flemings, MD  aspirin 325 MG tablet Take 325 mg by mouth every morning.     Historical Provider, MD  atorvastatin (LIPITOR) 20 MG tablet Take 1 tablet (20 mg total) by mouth every morning. 01/22/14   Donne Hazel, MD  benzonatate (TESSALON) 100 MG capsule Take 1 capsule (100 mg total) by mouth every 8 (eight) hours. Patient taking differently: Take 100 mg by mouth 3 (three) times  daily as needed for cough.  12/01/14   April Palumbo, MD  budesonide-formoterol [facility]) 160-4.5 MCG/ACT inhaler Inhale 2 puffs into the lungs 2 (two) times daily. 11/21/14   Debbe Odea, MD  cloNIDine (CATAPRES) 0.2 MG tablet Take 0.2 mg by mouth 2 (two) times daily.    Historical Provider, MD  clotrimazole (LOTRIMIN) 1 % cream Apply to affected area 2 times daily Patient taking differently: Apply 1 application topically 2 (two) times daily.  06/20/14   Charlann Lange, PA-C  diphenhydrAMINE (BENADRYL) 25 mg capsule  Take 25 mg by mouth every 6 (six) hours as needed for itching or allergies (allergies).     Historical Provider, MD  Emollient (EUCERIN) lotion Apply 5 mLs topically 2 (two) times daily as needed for dry skin. Patient taking differently: Apply 5 mLs topically 3 (three) times daily.  04/22/14   Linton Flemings, MD  folic acid (FOLVITE) 1 MG tablet Take 1 tablet (1 mg total) by mouth daily. 01/22/14   Donne Hazel, MD  furosemide (LASIX) 40 MG tablet Take 1 tablet (40 mg total) by mouth 2 (two) times daily. 01/22/14   Donne Hazel, MD  guaiFENesin (MUCINEX) 600 MG 12 hr tablet Take 1 tablet (600 mg total) by mouth 2 (two) times daily. Patient not taking: Reported on 12/12/2014 11/21/14   Debbe Odea, MD  hydrOXYzine (ATARAX/VISTARIL) 25 MG tablet Take 25 mg by mouth 3 (three) times daily as needed for anxiety or itching (anxiety and itching).     Historical Provider, MD  ketoconazole (NIZORAL) 2 % shampoo Apply 1 application topically daily as needed for irritation (irritation).     Historical Provider, MD  ketoconazole (NIZORAL) 200 MG tablet Take 200 mg by mouth every morning.     Historical Provider, MD  lisinopril (PRINIVIL,ZESTRIL) 40 MG tablet Take 40 mg by mouth daily.    Historical Provider, MD  Multiple Vitamin (MULTIVITAMIN WITH MINERALS) TABS tablet Take 1 tablet by mouth daily. 01/22/14   Donne Hazel, MD  pantoprazole (PROTONIX) 40 MG tablet Take 1 tablet (40 mg total) by mouth every morning. 01/22/14   Donne Hazel, MD  predniSONE (DELTASONE) 20 MG tablet Take 1 tablet (20 mg total) by mouth daily with breakfast. Patient not taking: Reported on 12/13/2014 11/21/14   Debbe Odea, MD  solifenacin (VESICARE) 5 MG tablet Take 5 mg by mouth every morning.     Historical Provider, MD  vitamin B-12 (CYANOCOBALAMIN) 1000 MCG tablet Take 1 tablet (1,000 mcg total) by mouth every morning. 01/22/14   Donne Hazel, MD  vitamin E 100 UNIT capsule Take 1 capsule (100 Units total) by mouth every morning.  01/22/14   Donne Hazel, MD    Past Medical History  Diagnosis Date  . Asthma   . GERD (gastroesophageal reflux disease)   . Hypertension   . Homelessness   . Low back pain   . 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 Arthritis   . Gout   . Hyperlipemia   . Morbid obesity ([place])     uses motor wheel chair  . MRSA (methicillin resistant Staphylococcus aureus)     states about a year ago  . CHF (congestive heart failure) ([place])   . Chronic abdominal pain   . Hidradenitis   . Chronic pain     "all over"  . Renal insufficiency   . On home O2     3L N/[place] O2 continuously  .  OSA (obstructive sleep apnea)     non-compliant with CPAP    Past Surgical History  Procedure Laterality Date  . Tubal ligation    . Cesarean section    . Back surgery      Family history: family history includes Asthma in her mother; Diabetes in her father; Heart disease in her father; Stroke in her father.  Social History: Patient lives in temporary housing currently.  She is on SSI.  She does not smoke, does not use drugs, does not use alcohol.  She walks short distances, otherwise uses a motorized chair because of her weight.        Physical Exam: BP 159/97 mmHg  Pulse 63  Temp(Src) 98.2 F (36.8 C) (Oral)  SpO2 95%  LMP 03/24/2009 General appearance: Massively obese adult female, alert and in no acute distress.  Nasal cannula on.   Eyes: Anicteric, conjunctiva pink, lids and lashes normal.     ENT: No nasal deformity, discharge, or epistaxis.  OP moist without lesions.   Lymph: No cervical or supraclavicular lymphadenopathy. Skin: Warm and dry.  Excoriated ovoid lesions on legs, numerous, wtihout surreounding erythema or induration.  Diffuse red papular rash on extensor surfaces. Cardiac: RRR, nl S1-S2, heart sounds distant.  Capillary refill is brisk.  JVP not visible.  No pitting LE edema, but legs large.  Respiratory: Normal respiratory rate and rhythm.  Poor inspiratory  excursion, no rales appreciated, no wheezes. Abdomen: Abdomen soft without rigidity.  No TTP. No ascites, distension.   MSK: No deformities or effusions. Neuro: Sensorium intact and responding to questions, attention normal.  Speech is fluent.  Moves all extremities equally and with normal coordination.    Psych: Behavior appropriate.  Affect blunted.  No evidence of aural or visual hallucinations or delusions.       Labs on Admission:  The metabolic panel shows sodium, potassium, bicarbonate.  Stable CKD stage III. No hyperglycemia. Transaminases and bilirubin are normal. Troponin negative. BNP 149, hard to interpret in a patient with his BMI. The complete blood count shows no leukocytosis, anemia, thrombocytopenia.   Radiological Exams on Admission: Personally reviewed: Dg Chest 2 View  06/09/2015  CLINICAL DATA:  Dyspnea and cough 3 days EXAM: CHEST  2 VIEW COMPARISON:  12/13/2014 FINDINGS: There is unchanged cardiomegaly. No confluent airspace consolidation. No effusions. Pulmonary vasculature is grossly normal. Study limited by body habitus. IMPRESSION: Unchanged cardiomegaly.  No acute cardiopulmonary findings. Electronically Signed   By: Andreas Newport M.D.   On: 06/09/2015 01:29    EKG: Independently reviewed. Rate 52, sinus rhythm. QTC 4:30. No ST changes.  Echo August 2016: EF 60% No valve disease  Assessment/Plan 1. Dyspnea:  Multifactorial.  Likely mostly right sided HF from untreated OSA and OHS, deconditioning.  Asthma may be contributing.  Pneumonia, PE doubted.  ACS doubted, but will cycle troponins. -Furosemide 40 mg IV daily -Strict I/Os, daily weights -Telemetry -Albuterol PRN -Cycle enzymes -Consult to SW for obtaining housing -Consult to CM for obtaining nebulizer and home O2 and CPAP  2. HTN:  -Continue home clonidine, lisinopril, full aspirin, statin  3. Bladder incontinence:  -Continue home Vesicare  4. GERD:  -Continue home PPI  5. Leg rash:   -Continue Eucerin and Lachydrin  6. Anxiety:  -Continue home alprazolam PRN at night  7. Asthma:  -Continue home Symbicort as Dulera as well as benzonatate  8. Bradycardia: This is not typical of previous ECGs.   -Cycle troponins -Monitor on Telemetry  DVT PPx: Lovenox Diet: Regular Consultants: None Code Status: FULL Family Communication: None present  Medical decision making: What exists of the patient's previous chart was reviewed in depth and the case was discussed with Dr. Stark Jock. Patient seen 5:29 AM on 06/09/2015.  Disposition Plan:  I recommend admission to telemetry, inpatient status.  Clinical condition: stable.  Anticipate diuresis for 2-3 days, then discharge with home nebulizer and CPAP, and home O2 if needed.      Edwin Dada [facility] Pager [phone]

## 2015-06-09 NOTE — Progress Notes (Signed)
[facility] PLAN OF CARE NOTE Patient: Kara Vincent [id number]   PCP: Harvie Junior, MD DOB: March 03, 1966   DOA: 06/08/2015   DOS: 06/09/2015    Patient was admitted by my colleague Dr.Danford earlier on 06/09/2015. I have reviewed the H&P as well as assessment and plan and agree with the same. Important changes in the plan are listed below.  Plan of care: Principal Problem:   Acute on chronic respiratory failure with hypoxia (HCC) Acute on chronic diastolic CHF Continue lasix Add CPAP QHS. Add nebulizer   Active Problems:   Chronic pain Avoid narcotics in patient with severe OSA and hypoxia Tolerated toradol in past which is ordered Add gabapentin  Rash Prior history of congenital rash Add clindamycin, treated with levofloxacin in the past.  Mood disorder Continue home medication  Author: Berle Mull, MD [facility] Pager: [phone] 06/09/2015 4:01 PM   If 7PM-7AM, please contact night-coverage at [email or web address], password [email or web address]

## 2015-06-09 NOTE — Progress Notes (Signed)
MRSA PCR positive.  MD made aware.  Standing order set put in.

## 2015-06-09 NOTE — Progress Notes (Signed)
Pt placed on cpap with 3L O2 bleed in at this time. Tolerating well.

## 2015-06-09 NOTE — ED Notes (Signed)
Showing lots of red raised bumps all over the chest and right arm.  They are warm to the touch, not weeping.  Painful to touch.  Patient states they have been there for the past week

## 2015-06-10 DIAGNOSIS — G4733 Obstructive sleep apnea (adult) (pediatric): Secondary | ICD-10-CM

## 2015-06-10 DIAGNOSIS — G8929 Other chronic pain: Secondary | ICD-10-CM

## 2015-06-10 DIAGNOSIS — I509 Heart failure, unspecified: Secondary | ICD-10-CM

## 2015-06-10 DIAGNOSIS — N183 Chronic kidney disease, stage 3 (moderate): Secondary | ICD-10-CM

## 2015-06-10 LAB — BASIC METABOLIC PANEL
Anion gap: 9 (ref 5–15)
BUN: 22 mg/dL — AB (ref 6–20)
CALCIUM: 8.3 mg/dL — AB (ref 8.9–10.3)
CO2: 34 mmol/L — ABNORMAL HIGH (ref 22–32)
Chloride: 101 mmol/L (ref 101–111)
Creatinine, Ser: 1.47 mg/dL — ABNORMAL HIGH (ref 0.44–1.00)
GFR calc Af Amer: 47 mL/min — ABNORMAL LOW (ref >60)
GFR, EST NON AFRICAN AMERICAN: 40 mL/min — AB (ref >60)
GLUCOSE: 125 mg/dL — AB (ref 65–99)
Potassium: 4.3 mmol/L (ref 3.5–5.1)
Sodium: 144 mmol/L (ref 135–145)

## 2015-06-10 LAB — CBC
HEMATOCRIT: 40.1 % (ref 36.0–46.0)
Hemoglobin: 11.7 g/dL — ABNORMAL LOW (ref 12.0–15.0)
MCH: 27.2 pg (ref 26.0–34.0)
MCHC: 29.2 g/dL — AB (ref 30.0–36.0)
MCV: 93.3 fL (ref 78.0–100.0)
Platelets: 226 10*3/uL (ref 150–400)
RBC: 4.3 MIL/uL (ref 3.87–5.11)
RDW: 15.3 % (ref 11.5–15.5)
WBC: 7.9 10*3/uL (ref 4.0–10.5)

## 2015-06-10 LAB — MAGNESIUM: MAGNESIUM: 2.2 mg/dL (ref 1.7–2.4)

## 2015-06-10 MED ORDER — BUTALBITAL-APAP-CAFFEINE 50-325-40 MG PO TABS
1.0000 | ORAL_TABLET | Freq: Four times a day (QID) | ORAL | Status: DC | PRN
  Administered 2015-06-10 – 2015-06-11 (×4): 1 via ORAL
  Filled 2015-06-10 (×4): qty 1

## 2015-06-10 NOTE — Progress Notes (Signed)
Utilization Review Completed.Kara Vincent T3/19/2017  

## 2015-06-10 NOTE — Progress Notes (Signed)
[facility] Progress Note  Patient: Kara Vincent [id number]   PCP: Harvie Junior, MD DOB: June 09, 1965   DOA: 06/08/2015   DOS: 06/10/2015   Date of Service: the patient was seen and examined on 06/10/2015  Subjective: Continues to complain of leg pain and headache. No itching. No chest pain or shortness of breath. No nausea and vomiting. Nutrition: Tolerating oral diet  Brief hospital course: Patient was admitted on 06/08/2015, with complaint of shortness of breath, was found to be having bilateral expiratory wheezing as well as possible right-sided heart failure. At present I feel that the patient is more likely primarily respiratory infection and less likely CHF exacerbation Currently further plan is continue current treatment for asthma exacerbation.  Assessment and Plan: 1. Acute on chronic respiratory failure with hypoxia [facility]) Patient is currently on 3 L of oxygen which is her home dose. Likely mild worsening due to asthma exacerbation. Continue nebulizer, clindamycin.  2. Suspected acute on chronic diastolic dysfunction. Patient was given IV Lasix but with mild renal function worsening at present my suspicion for acute on chronic CHF is less likely and therefore we will transition her to oral Lasix tomorrow. Holding other nephrotoxic medications today.  3. acute on chronic kidney diseases. Chronic kidney disease stage III. Mild worsening today. Continue to hold nephrotoxic medication.  4. Morbid obesity, sleep apnea. Patient has lost a C Pap. She had similar history in her last admission on September. Prior history of medical noncompliance has been documented in the internal medicine clinic. At present continue Cipro daily at bedtime.  5. Chronic leg pain. Start on gabapentin. Avoid narcotics.  6. Chronic headache. A Fioricet will be added as needed. Continue Topamax.  7. Diffuse maculopapular rash. A prior history, of congenital illness. Complete 5  days of oral clindamycin. Used topical cream as well. Outpatient dermatology referral.  Activity: physical therapy ordered Bowel regimen: last BM 06/08/2015 DVT Prophylaxis: subcutaneous Heparin Nutrition: Heart healthy diet, patient has been requesting to be on regular diet as she does not like heart healthy diet, reinforcing on a daily basis that the patient needs to be on heart healthy diet not only in the hospital but also as an outpatient.  Advance goals of care discussion: Full code  HPI: As per the H and P dictated on admission, "Kara Vincent is a 50 y.o. 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  Had to move out of her housing recently (the [facility]) and has been living in a [employer]. She lost her CPAP, her nebulizers, her home O2, and her furosemide at that time, she tells me. Since then, she has had worsening dyspnea, leg pain and swelling, so she came to the ED. No fever, chills. Chronic cough productive of yellowish sputum. No wheezing.  In the ED, she was initially saturating well on room air and hemodynamically stable. Na 140, K 4.5, Cr 1.2, WBC 9.6, afebrile, BNP 149, and TNI normal. A chest x-ray was uninterpretable due to body habitus. An ECG showed a sinus bradycardia without ST changes. Albuterol was administered without relief and she received one dose of furosemide.  The patient's last admission was in August of last year. She was admitted, Pulm was consulted, she was diuresed 11L, and discharged at 417lbs (current weight 398 lbs). " Procedures: None Consultants: None Antibiotics: Anti-infectives  Start     Dose/Rate Route Frequency Ordered Stop   06/09/15 1015  clindamycin (CLEOCIN) capsule 600 mg     600 mg Oral Every 8 hours 06/09/15 0934 06/16/15 0959        Family Communication: no family was present at bedside, at the time of interview.   Disposition:  Expected discharge date: 06/11/2015 Barriers to safe discharge: Arrangement for home treatment and monitoring of renal function   Intake/Output Summary (Last 24 hours) at 06/10/15 1726 Last data filed at 06/10/15 0500  Gross per 24 hour  Intake    520 ml  Output    300 ml  Net    220 ml   Filed Weights   06/09/15 0636 06/10/15 0500  Weight: 183.4 kg (404 lb 5.2 oz) 182.4 kg (402 lb 1.9 oz)    Objective: Physical Exam: Filed Vitals:   06/10/15 0455 06/10/15 0500 06/10/15 1430 06/10/15 1524  BP: 128/72   110/65  Pulse: 53   60  Temp: 97.9 F (36.6 C)   98 F (36.7 C)  TempSrc: Oral   Oral  Resp: 18   18  Height:      Weight:  182.4 kg (402 lb 1.9 oz)    SpO2: 98%  97% 96%     General: Appear in mild distress, diffuse macular papular Rash; Oral Mucosa moist. Cardiovascular: S1 and S2 Present, no Murmur, no JVD Respiratory: Bilateral Air entry present and Clear to Auscultation, no Crackles, no wheezes Abdomen: Bowel Sound present, Soft and bilateral tenderness Extremities: trace Pedal edema, no calf tenderness Neurology: Grossly no focal neuro deficit.  Data Reviewed: CBC:  Recent Labs Lab 06/09/15 0320 06/10/15 0606  WBC 9.6 7.9  NEUTROABS 6.8  --   HGB 12.1 11.7*  HCT 39.5 40.1  MCV 91.0 93.3  PLT 210 [id number]   Basic Metabolic Panel:  Recent Labs Lab 06/09/15 0320 06/10/15 0606  NA 140 144  K 4.5 4.3  CL 104 101  CO2 29 34*  GLUCOSE 102* 125*  BUN 21* 22*  CREATININE 1.20* 1.47*  CALCIUM 8.1* 8.3*  MG  --  2.2   Liver Function Tests:  Recent Labs Lab 06/09/15 0320  AST 13*  ALT 12*  ALKPHOS 59  BILITOT 0.7  PROT 7.1  ALBUMIN 3.5   No results for input(s): LIPASE, AMYLASE in the last 168 hours. No results for input(s): AMMONIA in the last 168 hours.  Cardiac Enzymes:  Recent Labs Lab 06/09/15 0713 06/09/15 1235 06/09/15 1854   TROPONINI <0.03 <0.03 <0.03    BNP (last 3 results)  Recent Labs  12/05/14 0427 12/13/14 0207 06/09/15 0320  BNP 30.6 35.5 149.6*    CBG: No results for input(s): GLUCAP in the last 168 hours.  Recent Results (from the past 240 hour(s))  MRSA PCR Screening     Status: Abnormal   Collection Time: 06/09/15  6:59 AM  Result Value Ref Range Status   MRSA by PCR POSITIVE (A) NEGATIVE Final    Comment:        The GeneXpert MRSA Assay (FDA approved for NASAL specimens only), is one component of a comprehensive MRSA colonization surveillance program. It is not intended to diagnose MRSA infection nor to guide or monitor treatment for MRSA infections. RESULT CALLED TO, READ BACK BY AND VERIFIED WITH: DUNKLEBERGER,K AT [place] ON FP:[id number] BY HOOKER,B      Studies: No results found.   Scheduled Meds: . allopurinol  100 mg Oral q morning -  10a  . aspirin  325 mg Oral q morning - 10a  . atorvastatin  20 mg Oral q morning - 10a  . Chlorhexidine Gluconate Cloth  6 each Topical Q0600  . clindamycin  600 mg Oral Q8H  . cloNIDine  0.2 mg Oral BID  . darifenacin  7.5 mg Oral Daily  . enoxaparin (LOVENOX) injection  90 mg Subcutaneous Q24H  . escitalopram  10 mg Oral Daily  . folic acid  1 mg Oral Daily  . gabapentin  100 mg Oral BID  . hydrocerin  1 application Topical TID  . ipratropium-albuterol  3 mL Nebulization TID  . isosorbide mononitrate  30 mg Oral Daily  . mometasone-formoterol  2 puff Inhalation BID  . mupirocin ointment  1 application Nasal BID  . pantoprazole  40 mg Oral q morning - 10a  . sodium chloride flush  3 mL Intravenous Q12H  . topiramate  100 mg Oral QHS   Continuous Infusions:  PRN Meds: acetaminophen, albuterol, ALPRAZolam, ammonium lactate, benzonatate, butalbital-acetaminophen-caffeine  Time spent: 30 minutes  Author: Berle Mull, MD [facility] Pager: [phone] 06/10/2015 5:26 PM  If 7PM-7AM, please contact night-coverage at  [email or web address], password [facility]

## 2015-06-10 NOTE — Progress Notes (Signed)
SATURATION QUALIFICATIONS: (This note is used to comply with regulatory documentation for home oxygen)  Patient Saturations on Room Air at Rest = 88%  Patient Saturations on 3L at rest= 94%

## 2015-06-11 ENCOUNTER — Telehealth: Payer: Self-pay

## 2015-06-11 LAB — CBC
HCT: 40.5 % (ref 36.0–46.0)
HEMOGLOBIN: 11.5 g/dL — AB (ref 12.0–15.0)
MCH: 27.7 pg (ref 26.0–34.0)
MCHC: 28.4 g/dL — AB (ref 30.0–36.0)
MCV: 97.6 fL (ref 78.0–100.0)
PLATELETS: 230 10*3/uL (ref 150–400)
RBC: 4.15 MIL/uL (ref 3.87–5.11)
RDW: 15.4 % (ref 11.5–15.5)
WBC: 9.1 10*3/uL (ref 4.0–10.5)

## 2015-06-11 LAB — BASIC METABOLIC PANEL
ANION GAP: 6 (ref 5–15)
BUN: 25 mg/dL — ABNORMAL HIGH (ref 6–20)
CALCIUM: 8.2 mg/dL — AB (ref 8.9–10.3)
CO2: 35 mmol/L — ABNORMAL HIGH (ref 22–32)
CREATININE: 1.37 mg/dL — AB (ref 0.44–1.00)
Chloride: 100 mmol/L — ABNORMAL LOW (ref 101–111)
GFR, EST AFRICAN AMERICAN: 51 mL/min — AB (ref >60)
GFR, EST NON AFRICAN AMERICAN: 44 mL/min — AB (ref >60)
GLUCOSE: 117 mg/dL — AB (ref 65–99)
Potassium: 4.8 mmol/L (ref 3.5–5.1)
Sodium: 141 mmol/L (ref 135–145)

## 2015-06-11 LAB — MAGNESIUM: MAGNESIUM: 2.2 mg/dL (ref 1.7–2.4)

## 2015-06-11 IMAGING — CR DG CHEST 2V
1 series · 1 of 1 positions shown · non-contrast
Comparison: 09/25/2012.

CLINICAL DATA: Shortness of breath.

CHEST - 2 VIEW

[view not recorded]
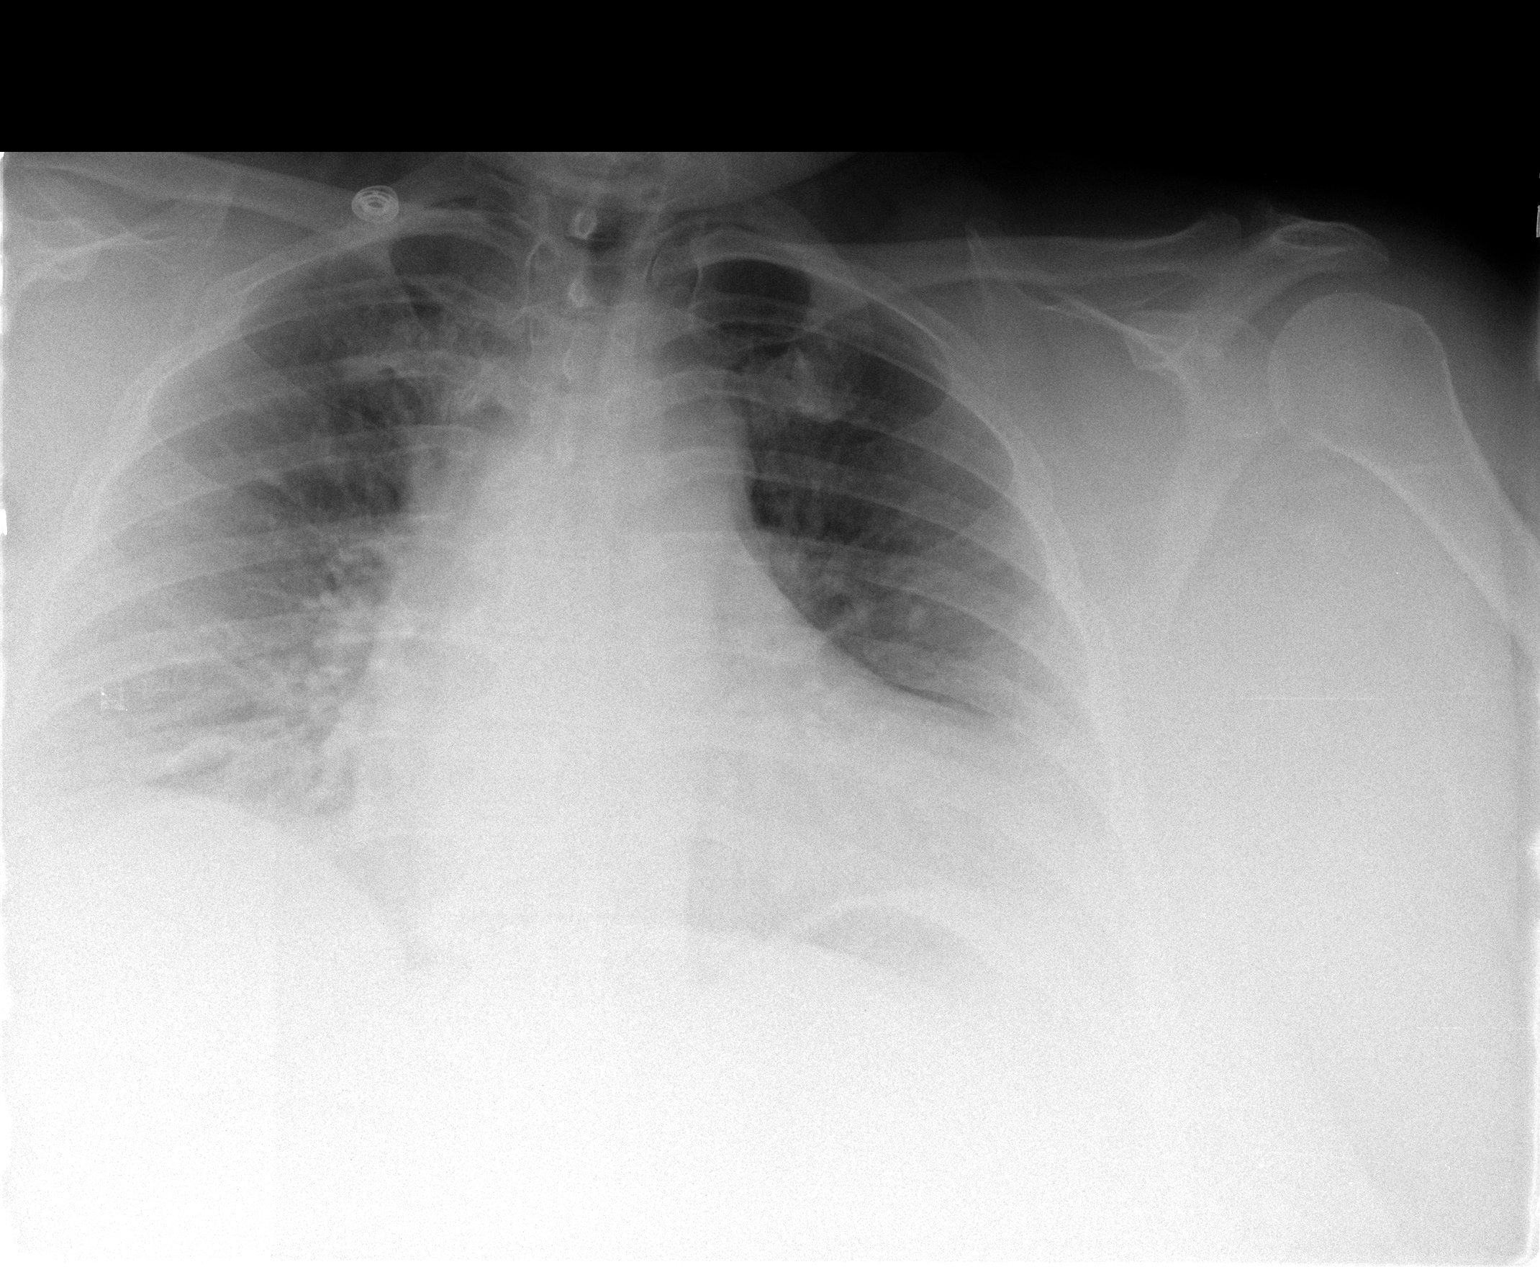

[1 of 1 positions shown; findings below may reference images not displayed]

FINDINGS: Study limited by underpenetration and hypoaeration.

No change in cardiomegaly.  No change in vascular crowding at the
bases.  No overt edema, effusion, or pneumothorax.

No acute osseous findings.
IMPRESSION: 1.  No evidence of acute cardiopulmonary disease.
2.  Hypoaeration.
3.  Chronic cardiomegaly.

## 2015-06-11 MED ORDER — METHOCARBAMOL 500 MG PO TABS: 500.0000 mg | ORAL_TABLET | Freq: Three times a day (TID) | ORAL | Status: DC | PRN

## 2015-06-11 MED ORDER — BENZONATATE 100 MG PO CAPS: 100.0000 mg | ORAL_CAPSULE | Freq: Three times a day (TID) | ORAL | Status: DC | PRN

## 2015-06-11 MED ORDER — TRAMADOL HCL 50 MG PO TABS
50.0000 mg | ORAL_TABLET | Freq: Once | ORAL | Status: AC
  Administered 2015-06-11: 50 mg via ORAL
  Filled 2015-06-11: qty 1

## 2015-06-11 MED ORDER — CLINDAMYCIN HCL 300 MG PO CAPS: 600.0000 mg | ORAL_CAPSULE | Freq: Three times a day (TID) | ORAL | Status: AC

## 2015-06-11 MED ORDER — DM-GUAIFENESIN ER 30-600 MG PO TB12
1.0000 | ORAL_TABLET | Freq: Two times a day (BID) | ORAL | Status: DC
  Administered 2015-06-11: 1 via ORAL
  Filled 2015-06-11 (×2): qty 1

## 2015-06-11 MED ORDER — PANTOPRAZOLE SODIUM 40 MG PO TBEC: 40.0000 mg | DELAYED_RELEASE_TABLET | Freq: Every morning | ORAL | Status: DC

## 2015-06-11 MED ORDER — PREDNISONE 10 MG PO TABS: ORAL_TABLET | ORAL | Status: DC

## 2015-06-11 MED ORDER — GABAPENTIN 100 MG PO CAPS: 100.0000 mg | ORAL_CAPSULE | Freq: Two times a day (BID) | ORAL | Status: DC

## 2015-06-11 MED ORDER — DM-GUAIFENESIN ER 30-600 MG PO TB12: 1.0000 | ORAL_TABLET | Freq: Two times a day (BID) | ORAL | Status: DC

## 2015-06-11 MED ORDER — ATORVASTATIN CALCIUM 20 MG PO TABS: 20.0000 mg | ORAL_TABLET | Freq: Every morning | ORAL | Status: DC

## 2015-06-11 MED ORDER — FUROSEMIDE 20 MG PO TABS: 40.0000 mg | ORAL_TABLET | Freq: Every day | ORAL | Status: DC | PRN

## 2015-06-11 MED ORDER — MOMETASONE FURO-FORMOTEROL FUM 100-5 MCG/ACT IN AERO: 2.0000 | INHALATION_SPRAY | Freq: Two times a day (BID) | RESPIRATORY_TRACT | Status: DC

## 2015-06-11 MED ORDER — BUTALBITAL-APAP-CAFFEINE 50-325-40 MG PO TABS: 1.0000 | ORAL_TABLET | Freq: Four times a day (QID) | ORAL | Status: DC | PRN

## 2015-06-11 NOTE — Progress Notes (Signed)
[facility]  Delivered travel tank to hospital room.  Patient could not transport 2 tanks as she is discharging by [facility] transportation and she is in a power wheelchair. [facility] will deliver oxygen to the motel that the patient is currently staying at.  Technician will stop by the front desk for assistance with getting into the room.  Patient should not discharge until oxygen is set up at the motel.  She also inquired about needing another CPAP.  Medicaid has paid for a CPAP on 09/10/2011 and she is not eligible for a new one until 08/30/2016.    Linward Headland 06/11/2015, 3:04 PM

## 2015-06-11 NOTE — Progress Notes (Signed)
[facility]  Rec'd order for home oxygen.  Holding for doctor to correct order to say via nasal cannula.  Will deliver after order is corrected.    Linward Headland 06/11/2015, 12:22 PM

## 2015-06-11 NOTE — Telephone Encounter (Signed)
Call placed to Dessa Phi, RN CM regarding a hospital follow up appointment for the patient.  No appointment was scheduled as the patient currently wishes to continue to follow up with her PCP - Dr [name].

## 2015-06-11 NOTE — Progress Notes (Signed)
SATURATION QUALIFICATIONS: (This note is used to comply with regulatory documentation for home oxygen)  Patient Saturations on Room Air at Rest = 88%  Patient Saturations on Room Air while Ambulating = 87%  Patient Saturations on 3 Liters of oxygen while Ambulating = 90%  Please briefly explain why patient needs home oxygen: Pt unable to maintain O2 sat above 90% on room air  Kara Vincent, [name]

## 2015-06-11 NOTE — Care Management Note (Signed)
Case Management Note  Patient Details  Name: Kara Vincent MRN: [id number] Date of Birth: 04-12-1965  Subjective/Objective:MD wants patient set up w/[facility]-patient agrees. Contacted [facility] rep who will set up pcp appt.                    Action/Plan:d/c home w/home 02.   Expected Discharge Date:                  Expected Discharge Plan:  Home/Self Care  In-House Referral:     Discharge planning Services  CM Consult  Post Acute Care Choice:    Choice offered to:  Patient  DME Arranged:  Oxygen DME Agency:  [place]:    [place] Agency:     Status of Service:  Completed, signed off  Medicare Important Message Given:    Date Medicare IM Given:    Medicare IM give by:    Date Additional Medicare IM Given:    Additional Medicare Important Message give by:     If discussed at [place] of Stay Meetings, dates discussed:    Additional Comments:  Dessa Phi, RN 06/11/2015, 1:35 PM

## 2015-06-11 NOTE — Care Management Note (Signed)
Case Management Note  Patient Details  Name: Kara Vincent MRN: [id number] Date of Birth: 1965/09/22  Subjective/Objective: 50 y/o f admitted w/acute on chronic resp failure.From motel.Has pcp, pharmacy. Qualified for home 02-[facility] rep [employer] has delivered home 02 to rm. D/c back to motel via bus pass.                   Action/Plan:d/c home-motel w/home 02   Expected Discharge Date:                  Expected Discharge Plan:  Home/Self Care  In-House Referral:     Discharge planning Services  CM Consult  Post Acute Care Choice:    Choice offered to:  Patient  DME Arranged:  Oxygen DME Agency:  [place]:    [place] Agency:     Status of Service:  Completed, signed off  Medicare Important Message Given:    Date Medicare IM Given:    Medicare IM give by:    Date Additional Medicare IM Given:    Additional Medicare Important Message give by:     If discussed at [place] of Stay Meetings, dates discussed:    Additional Comments:  Dessa Phi, RN 06/11/2015, 12:57 PM

## 2015-06-11 NOTE — Progress Notes (Signed)
[facility] is providing the following services:  Home oxygen  If patient discharges after hours, please call [phone].   Linward Headland 06/11/2015, 2:14 PM

## 2015-06-11 NOTE — Care Management Note (Signed)
Case Management Note  Patient Details  Name: Kara Vincent MRN: [id number] Date of Birth: November 22, 1965  Subjective/Objective:   Noted per [facility] liason-patient already has pcp-Dr. Dellia Nims notified. [facility] dme rep lecretia has delivered 1 home 02 tank to ptaient's rm for d/c-they will deliver 2nd home 02 tank to patient's [facility] & patient has made arrangements for delivery of 2nd home 02 tank. Patient has received, & lost several CPAP's from [place] already.Patient to d/c via bus pass-Nsg will give. No further d/c needs.                 Action/Plan:d/c plan home   Expected Discharge Date:                  Expected Discharge Plan:  Home/Self Care  In-House Referral:     Discharge planning Services  CM Consult  Post Acute Care Choice:    Choice offered to:  Patient  DME Arranged:  Oxygen DME Agency:  [place]:    [place] Agency:     Status of Service:  Completed, signed off  Medicare Important Message Given:    Date Medicare IM Given:    Medicare IM give by:    Date Additional Medicare IM Given:    Additional Medicare Important Message give by:     If discussed at [place] of Stay Meetings, dates discussed:    Additional Comments:  Dessa Phi, RN 06/11/2015, 3:04 PM

## 2015-06-12 NOTE — Discharge Summary (Signed)
[facility] Discharge Summary   Patient: Kara Vincent [id number]   PCP: Harvie Junior, MD DOB: 05/16/1965   Date of admission: 06/08/2015   Date of discharge: 06/11/2015     Discharge Diagnoses:  Principal Problem:   Acute on chronic respiratory failure with hypoxia [facility]) Active Problems:   Chronic pain   Morbid obesity (HCC)   CKD (chronic kidney disease) stage 3, GFR 30-59 ml/min   Right-sided heart failure (HCC)   OSA (obstructive sleep apnea)   Shortness of breath   Recommendations for Outpatient Follow-up:  1. Please follow-up with PCP in one week   Follow-up Information    Follow up with [place].   Why:  home [address]   Contact information:   [address] [place] [address] [phone]       Follow up with [place]. Schedule an appointment as soon as possible for a visit in 1 week.   Contact information:   [address] [place] [phone]      Follow up with Harvie Junior, MD. Schedule an appointment as soon as possible for a visit in 1 week.   Specialty:  Family Medicine   Contact information:   [place] [id number] [phone]      Diet recommendation: regular heart healthy diet  Activity: The patient is advised to gradually reintroduce usual activities.  Discharge Condition: good  History of present illness: As per the H and P dictated on admission, "Kara Vincent is a 50 y.o. 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  Had to move out of her housing recently (the [facility]) and has been living in a [employer]. She lost her CPAP, her nebulizers, her home O2, and her furosemide at that time, she tells me. Since then, she has had  worsening dyspnea, leg pain and swelling, so she came to the ED. No fever, chills. Chronic cough productive of yellowish sputum. No wheezing.  In the ED, she was initially saturating well on room air and hemodynamically stable. Na 140, K 4.5, Cr 1.2, WBC 9.6, afebrile, BNP 149, and TNI normal. A chest x-ray was uninterpretable due to body habitus. An ECG showed a sinus bradycardia without ST changes. Albuterol was administered without relief and she received one dose of furosemide.  The patient's last admission was in August of last year. She was admitted, Pulm was consulted, she was diuresed 11L, and discharged at 417lbs (current weight 398 lbs)."  Hospital Course:  Summary of her active problems in the hospital is as following. 1. Acute on chronic respiratory failure with hypoxia [facility]) Patient is currently on 3 L of oxygen which is her home regimen. Likely mild worsening due to asthma exacerbation. Continue nebulizer, clindamycin.started on low-dose prednisone on discharge with taper  2. Suspected acute on chronic diastolic dysfunction. Patient was given IV Lasix but with mild renal function worsening at present my suspicion for acute on chronic CHF is less likely and therefore she was transitioned to oral Lasix. Holding other nephrotoxic medications.  3. acute on chronic kidney diseases. Chronic kidney disease stage III. Renal function remained stable Continue to hold nephrotoxic medication.  4. Morbid obesity, sleep apnea. Patient has lost a C Pap. She had reported similar history in  her last admission on September, has not followed up with pulmonary for sleep study. Prior history of medical noncompliance has been documented in the internal medicine clinic. Refer her for outpatient sleep study.  5. Chronic leg pain. Start on gabapentin. Avoid narcotics.  6. Chronic headache. A Fioricet we will be provided as needed Continue Topamax.  7. Diffuse maculopapular rash. A  prior history, of congenital illness. Complete 5 days of oral clindamycin. Used topical cream as well. Outpatient dermatology referral may be needed by PCP.  All other chronic medical condition were stable during the hospitalization.  Patient was ambulatory without any assistance, primarily uses a power chair at her baseline. On the day of the discharge the patient's vitals were stable, and no other acute medical condition were reported by patient. the patient was felt safe to be discharge at home with family support.  Procedures and Results:  none   Consultations:  none  DISCHARGE MEDICATION: Discharge Medication List as of 06/11/2015  3:11 PM    START taking these medications   Details  butalbital-acetaminophen-caffeine (FIORICET, ESGIC) 50-325-40 MG tablet Take 1 tablet by mouth every 6 (six) hours as needed for headache., Starting 06/11/2015, Until Discontinued, Print    clindamycin (CLEOCIN) 300 MG capsule Take 2 capsules (600 mg total) by mouth every 8 (eight) hours., Starting 06/11/2015, Until Fri 06/15/15, Normal    dextromethorphan-guaiFENesin (MUCINEX DM) 30-600 MG 12hr tablet Take 1 tablet by mouth 2 (two) times daily., Starting 06/11/2015, Until Discontinued, Normal    furosemide (LASIX) 20 MG tablet Take 2 tablets (40 mg total) by mouth daily as needed for fluid or edema (WEIGHT GAIN OF MORE THEN 3 LBS IN A DAY)., Starting 06/11/2015, Until Discontinued, Normal    gabapentin (NEURONTIN) 100 MG capsule Take 1 capsule (100 mg total) by mouth 2 (two) times daily., Starting 06/11/2015, Until Discontinued, Normal    mometasone-formoterol (DULERA) 100-5 MCG/ACT AERO Inhale 2 puffs into the lungs 2 (two) times daily., Starting 06/11/2015, Until Discontinued, Normal    predniSONE (DELTASONE) 10 MG tablet Take 40mg  daily for 3days,Take 30mg  daily for 3days,Take 20mg  daily for 3days,Take 10mg  daily for 3days, then stop., Normal      CONTINUE these medications which have CHANGED    Details  atorvastatin (LIPITOR) 20 MG tablet Take 1 tablet (20 mg total) by mouth every morning., Starting 06/11/2015, Until Discontinued, No Print    benzonatate (TESSALON) 100 MG capsule Take 1 capsule (100 mg total) by mouth 3 (three) times daily as needed for cough., Starting 06/11/2015, Until Discontinued, Print    methocarbamol (ROBAXIN) 500 MG tablet Take 1 tablet (500 mg total) by mouth every 8 (eight) hours as needed for muscle spasms., Starting 06/11/2015, Until Discontinued, Normal    pantoprazole (PROTONIX) 40 MG tablet Take 1 tablet (40 mg total) by mouth every morning., Starting 06/11/2015, Until Discontinued, Normal      CONTINUE these medications which have NOT CHANGED   Details  allopurinol (ZYLOPRIM) 100 MG tablet Take 1 tablet (100 mg total) by mouth every morning., Starting 01/22/2014, Until Discontinued, No Print    ALPRAZolam (XANAX) 1 MG tablet Take 1 mg by mouth 3 (three) times daily as needed for anxiety (anxiety). , Until Discontinued, Historical Med    ammonium lactate (AMLACTIN) 12 % cream Apply 1 g topically daily as needed for dry skin., Starting 04/22/2014, Until Discontinued, Print    aspirin 325 MG tablet Take 325 mg by mouth every morning. , Until Discontinued, Historical Med    budesonide-formoterol (  SYMBICORT) 160-4.5 MCG/ACT inhaler Inhale 2 puffs into the lungs 2 (two) times daily., Starting 11/21/2014, Until Discontinued, Print    cloNIDine (CATAPRES) 0.2 MG tablet Take 0.2 mg by mouth 2 (two) times daily., Until Discontinued, Historical Med    Emollient (EUCERIN) lotion Apply 5 mLs topically 2 (two) times daily as needed for dry skin., Starting 04/22/2014, Until Discontinued, Print    escitalopram (LEXAPRO) 10 MG tablet Take 10 mg by mouth daily., Until Discontinued, Historical Med    folic acid (FOLVITE) 1 MG tablet Take 1 tablet (1 mg total) by mouth daily., Starting 01/22/2014, Until Discontinued, No Print    hydrOXYzine (ATARAX/VISTARIL) 25 MG tablet  Take 25 mg by mouth 3 (three) times daily as needed for anxiety or itching (anxiety and itching). , Until Discontinued, Historical Med    solifenacin (VESICARE) 5 MG tablet Take 5 mg by mouth every morning. , Until Discontinued, Historical Med    topiramate (TOPAMAX) 100 MG tablet Take 100 mg by mouth at bedtime., Until Discontinued, Historical Med      STOP taking these medications     diphenhydrAMINE (BENADRYL) 25 mg capsule      isosorbide mononitrate (IMDUR) 30 MG 24 hr tablet      lisinopril (PRINIVIL,ZESTRIL) 40 MG tablet      naproxen (NAPROSYN) 500 MG tablet        Allergies  Allergen Reactions  . Ibuprofen Hives, Shortness Of Breath and Palpitations    Has tolerated toradol 12/2013 inpatient  . Adhesive [Tape] Rash  . Azithromycin Hives and Rash  . Doxycycline Rash  . Penicillins Rash    Has patient had a PCN reaction causing immediate rash, facial/tongue/throat swelling, SOB or lightheadedness with hypotension:Yes Has patient had a PCN reaction causing severe rash involving mucus membranes or skin necrosis:Yes Has patient had a PCN reaction that required hospitalization Yes Has patient had a PCN reaction occurring within the last 10 years:No If all of the above answers are "NO", then may proceed with Cephalosporin use.   . Sulfa Antibiotics Rash  . Sulfamethoxazole-Trimethoprim Rash   Discharge Instructions    AMB referral to CHF clinic    Complete by:  As directed      Ambulatory referral to Sleep Studies    Complete by:  As directed      Diet - low sodium heart healthy    Complete by:  As directed      Heart Failure patients record your daily weight using the same scale at the same time of day    Complete by:  As directed      Increase activity slowly    Complete by:  As directed           Discharge Exam: Filed Weights   06/09/15 0636 06/10/15 0500 06/11/15 0500  Weight: 183.4 kg (404 lb 5.2 oz) 182.4 kg (402 lb 1.9 oz) 182.9 kg (403 lb 3.5 oz)    Filed Vitals:   06/11/15 0642 06/11/15 1342  BP: 98/60   Pulse: 50   Temp: 97.9 F (36.6 C) 98 F (36.7 C)  Resp: 20 18   General: Appear in mild distress, diffuse maculopapularRash; Oral Mucosa moist. Cardiovascular: S1 and S2 Present, no Murmur, no JVD Respiratory: Bilateral Air entry present and Clear to Auscultation, no Crackles, no wheezes Abdomen: Bowel Sound present, Soft and no tenderness Extremities: no Pedal edema, no calf tenderness Neurology: Grossly no focal neuro deficit.  The results of significant diagnostics from this hospitalization (including imaging, microbiology,  ancillary and laboratory) are listed below for reference.    Significant Diagnostic Studies: Dg Chest 2 View  06/09/2015  CLINICAL DATA:  Dyspnea and cough 3 days EXAM: CHEST  2 VIEW COMPARISON:  12/13/2014 FINDINGS: There is unchanged cardiomegaly. No confluent airspace consolidation. No effusions. Pulmonary vasculature is grossly normal. Study limited by body habitus. IMPRESSION: Unchanged cardiomegaly.  No acute cardiopulmonary findings. Electronically Signed   By: Andreas Newport M.D.   On: 06/09/2015 01:29    Microbiology: Recent Results (from the past 240 hour(s))  MRSA PCR Screening     Status: Abnormal   Collection Time: 06/09/15  6:59 AM  Result Value Ref Range Status   MRSA by PCR POSITIVE (A) NEGATIVE Final    Comment:        The GeneXpert MRSA Assay (FDA approved for NASAL specimens only), is one component of a comprehensive MRSA colonization surveillance program. It is not intended to diagnose MRSA infection nor to guide or monitor treatment for MRSA infections. RESULT CALLED TO, READ BACK BY AND VERIFIED WITH: DUNKLEBERGER,K AT 0925 ON [id number] BY HOOKER,B      Labs: CBC:  Recent Labs Lab 06/09/15 0320 06/10/15 0606 06/11/15 0514  WBC 9.6 7.9 9.1  NEUTROABS 6.8  --   --   HGB 12.1 11.7* 11.5*  HCT 39.5 40.1 40.5  MCV 91.0 93.3 97.6  PLT 210 226 [id number]   Basic  Metabolic Panel:  Recent Labs Lab 06/09/15 0320 06/10/15 0606 06/11/15 0514  NA 140 144 141  K 4.5 4.3 4.8  CL 104 101 100*  CO2 29 34* 35*  GLUCOSE 102* 125* 117*  BUN 21* 22* 25*  CREATININE 1.20* 1.47* 1.37*  CALCIUM 8.1* 8.3* 8.2*  MG  --  2.2 2.2   Liver Function Tests:  Recent Labs Lab 06/09/15 0320  AST 13*  ALT 12*  ALKPHOS 59  BILITOT 0.7  PROT 7.1  ALBUMIN 3.5   No results for input(s): LIPASE, AMYLASE in the last 168 hours. No results for input(s): AMMONIA in the last 168 hours. Cardiac Enzymes:  Recent Labs Lab 06/09/15 0713 06/09/15 1235 06/09/15 1854  TROPONINI <0.03 <0.03 <0.03   BNP (last 3 results)  Recent Labs  12/05/14 0427 12/13/14 0207 06/09/15 0320  BNP 30.6 35.5 149.6*   CBG: No results for input(s): GLUCAP in the last 168 hours. Time spent: 30 minutes  Signed:  Berle Mull  [facility] 06/11/2015   , 7:11 PM

## 2015-07-01 IMAGING — CR DG CHEST 2V
2 series · 2 of 2 positions shown · non-contrast
Comparison: 11/22/2012

CLINICAL DATA: Shortness of breath

EXAM:
CHEST  2 VIEW

[w chest lat]
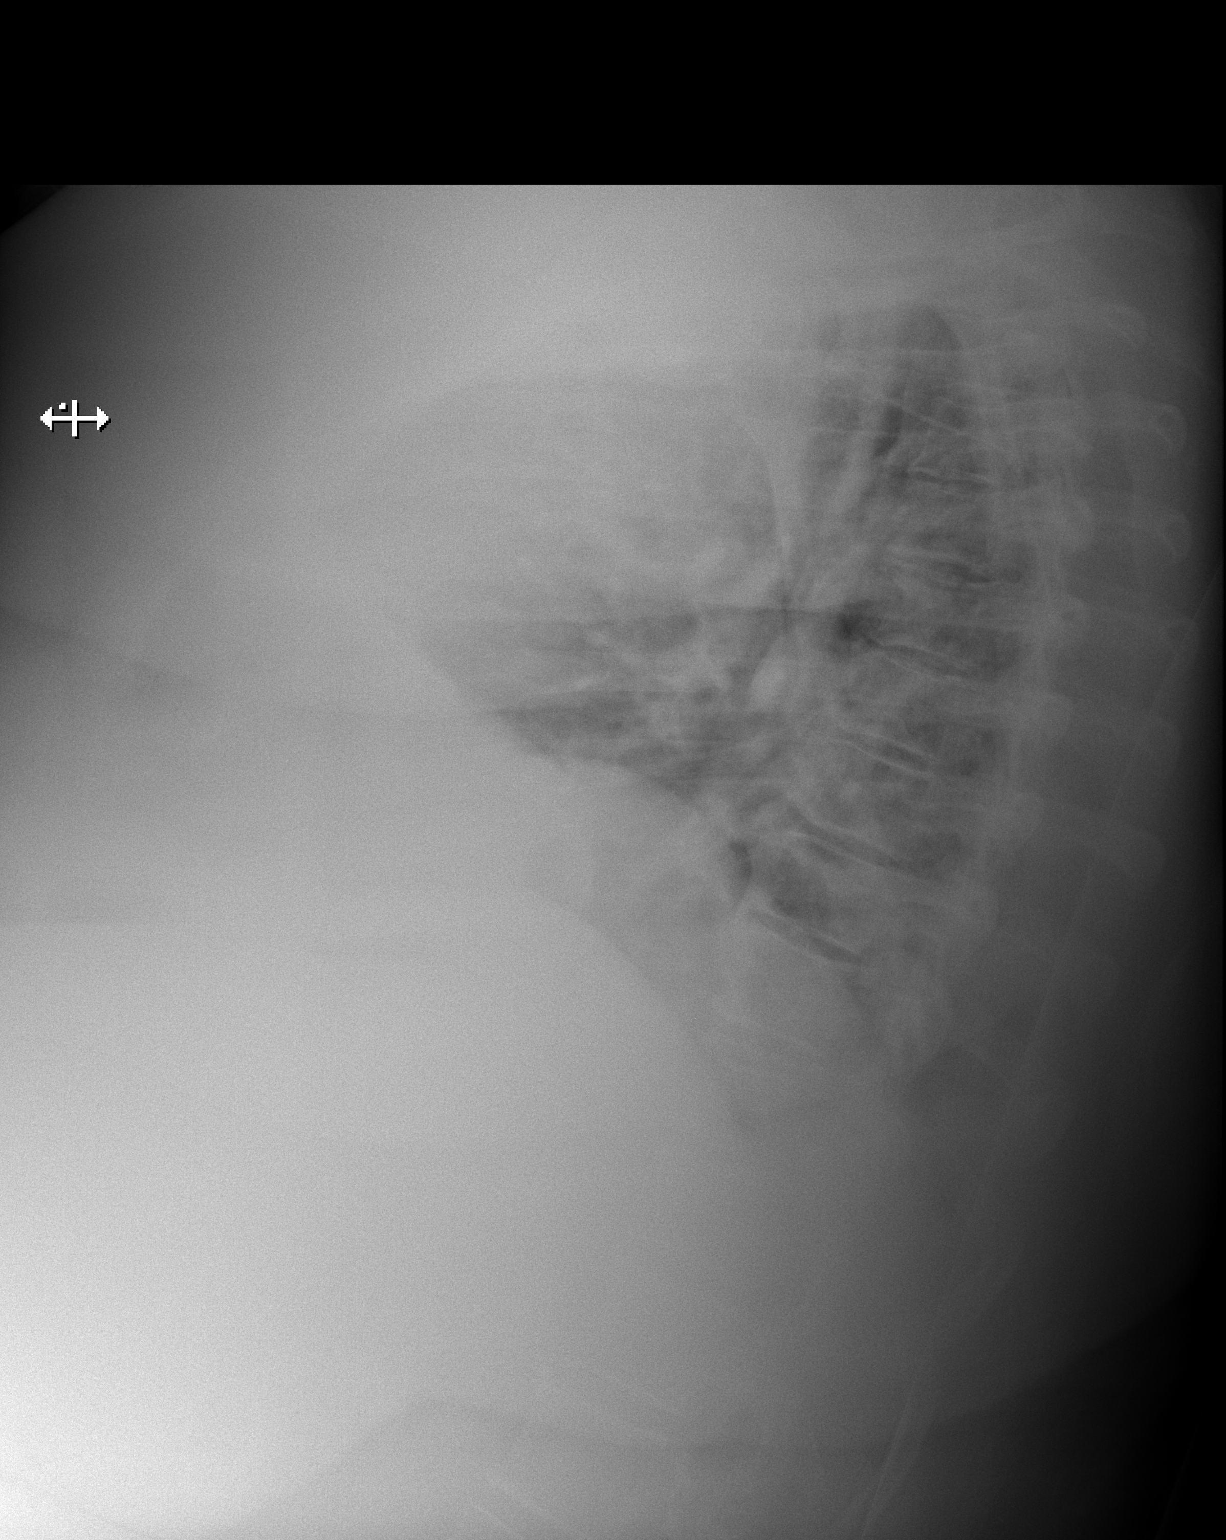

[x chest ap]
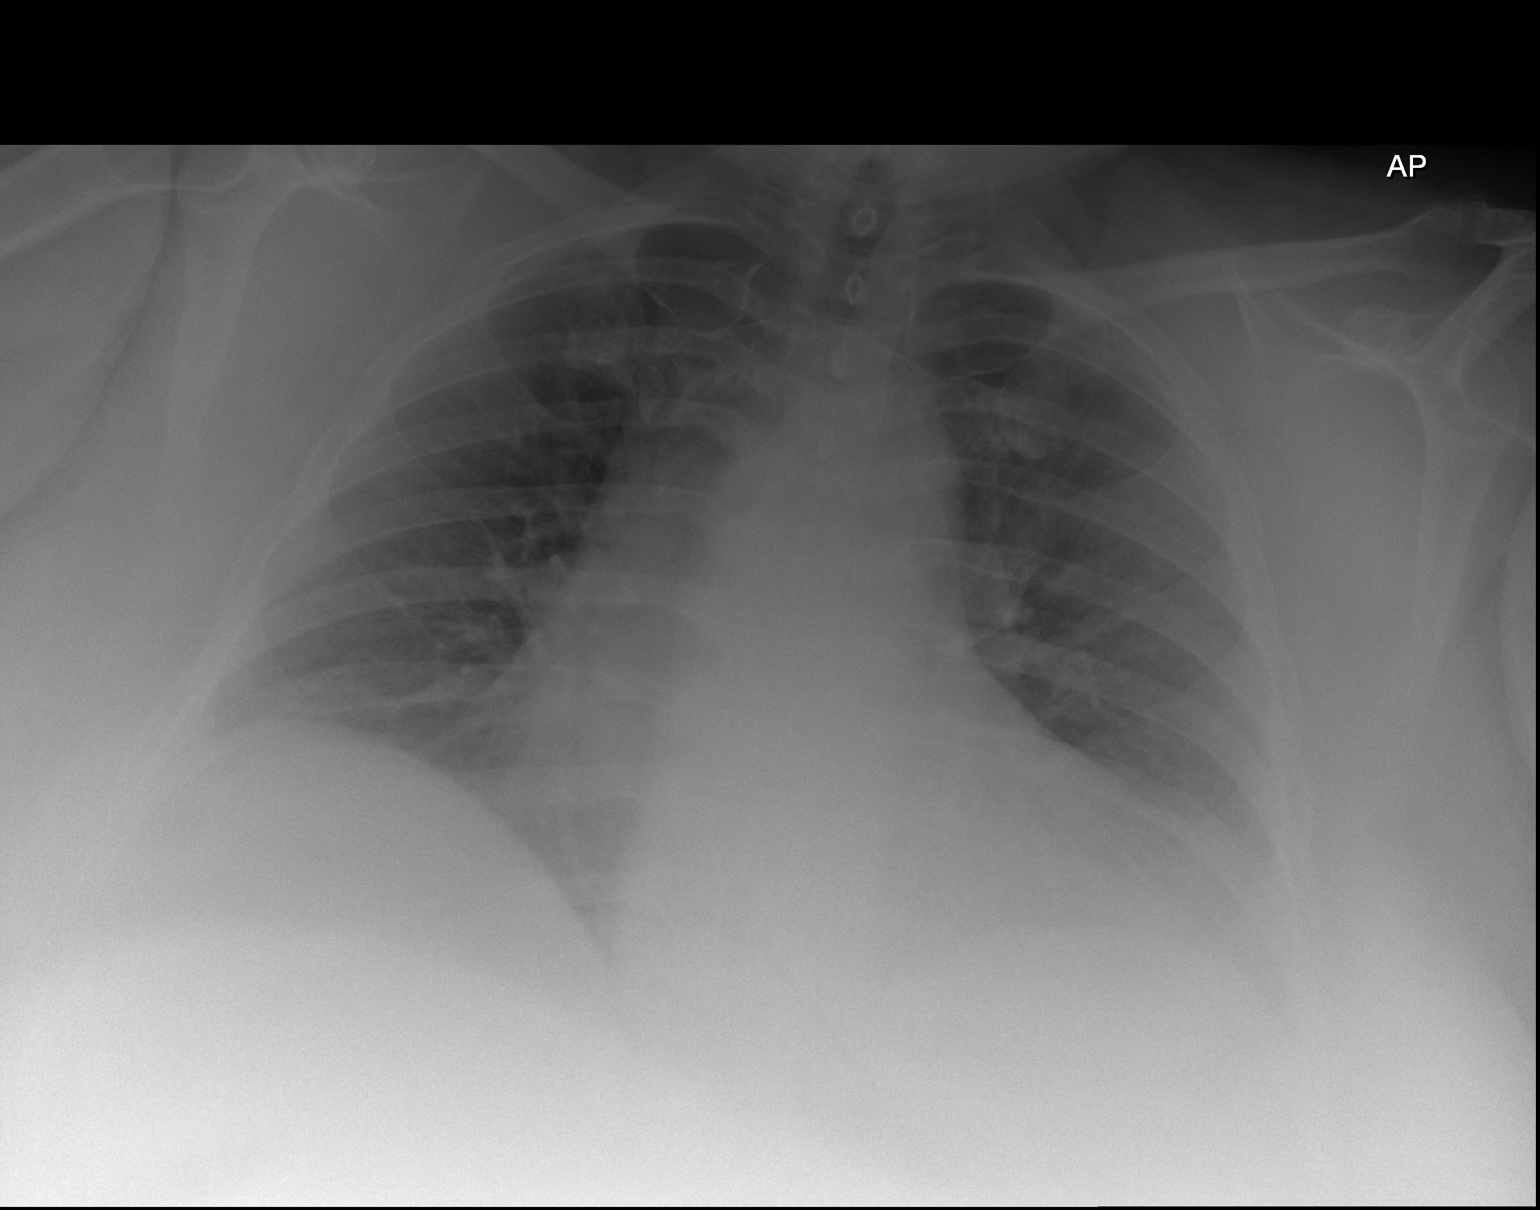

[2 of 2 positions shown; findings below may reference images not displayed]

FINDINGS: Chronic cardiomegaly. No focal opacity, edema, effusion, or
pneumothorax. No acute osseous findings.
IMPRESSION: 1. No active cardiopulmonary disease.
2. Chronic cardiomegaly.

## 2015-07-26 IMAGING — CR DG CHEST 2V
2 series · 2 of 2 positions shown · non-contrast
Comparison: 12/12/2012

CLINICAL DATA: Chest pain

EXAM:
CHEST  2 VIEW

[w chest lat]
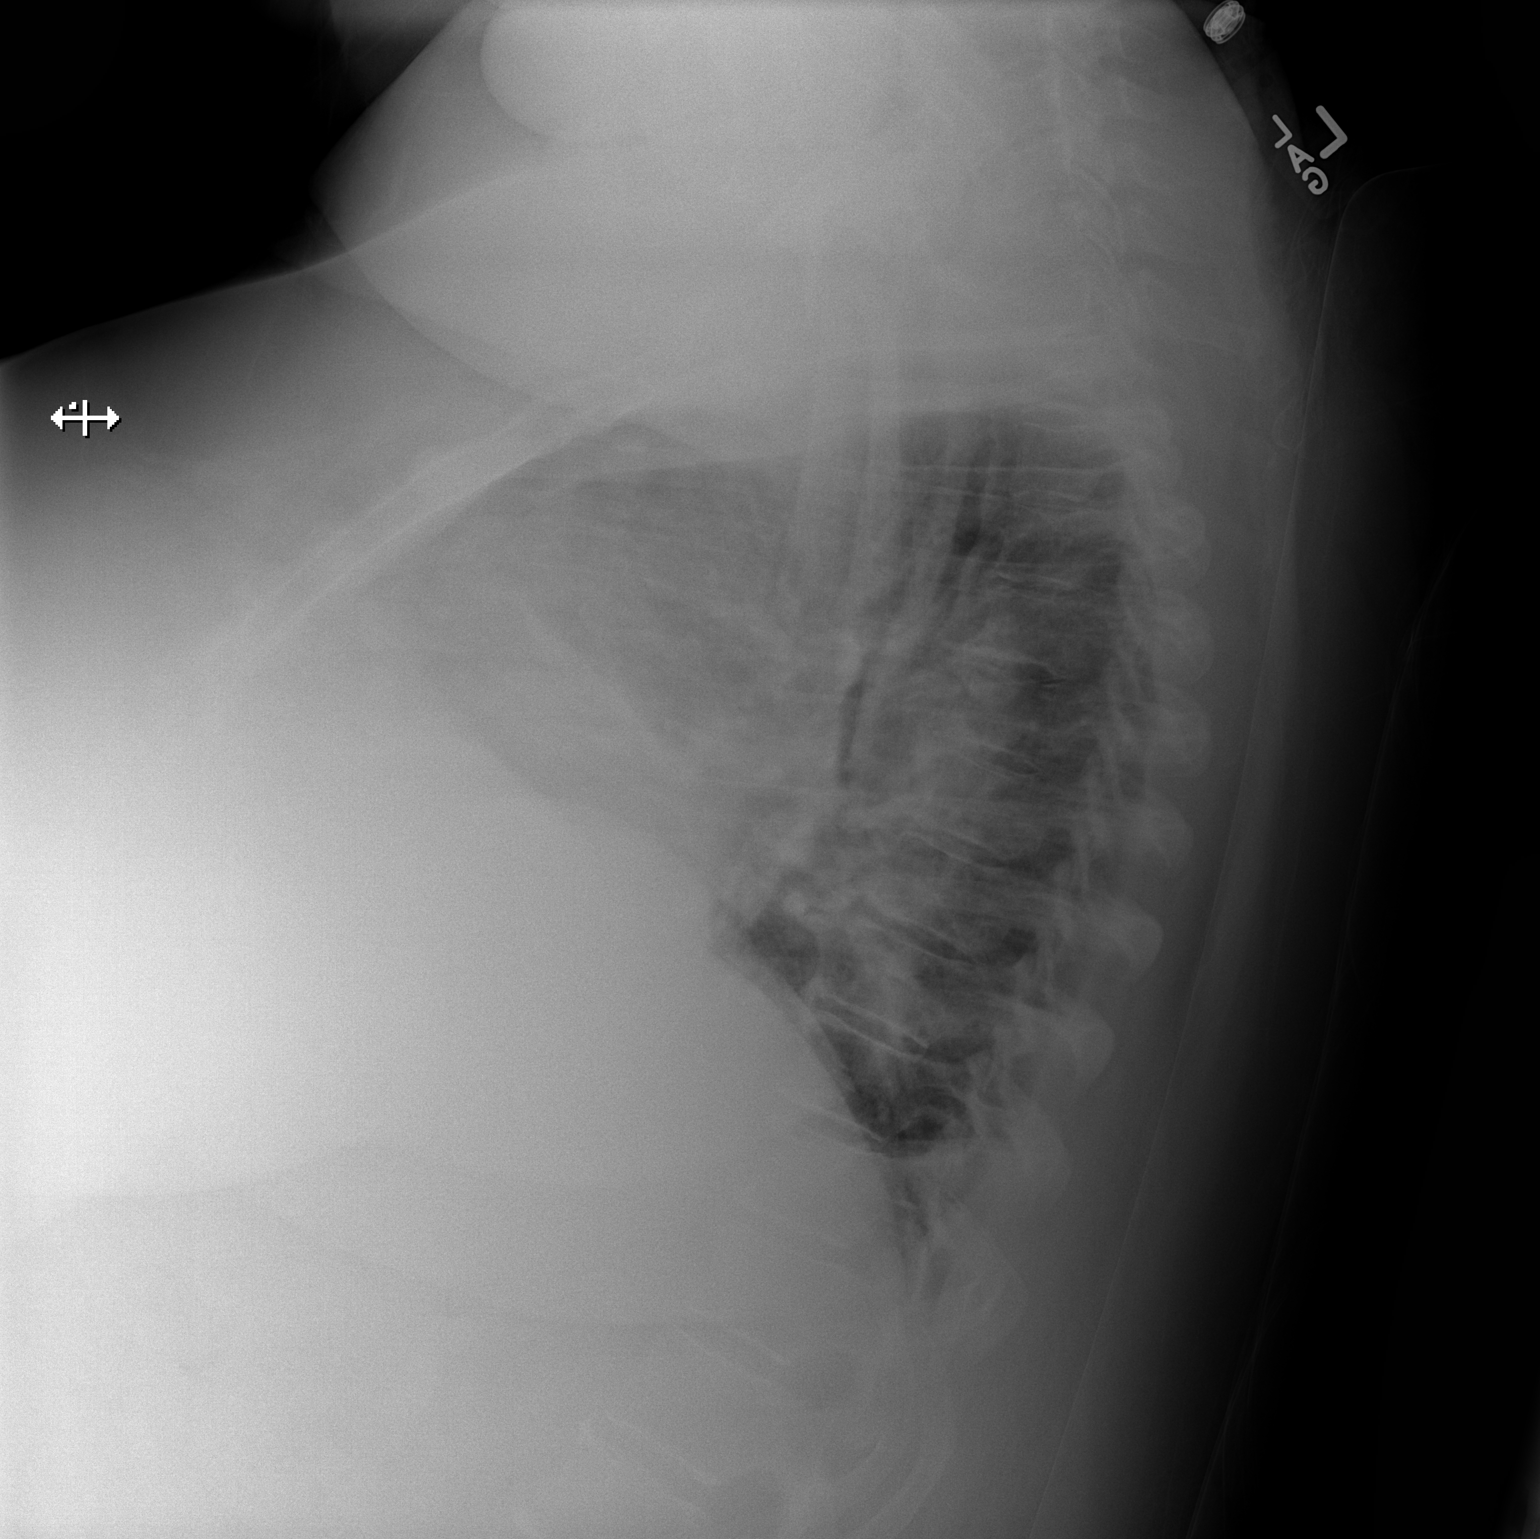

[x chest ap]
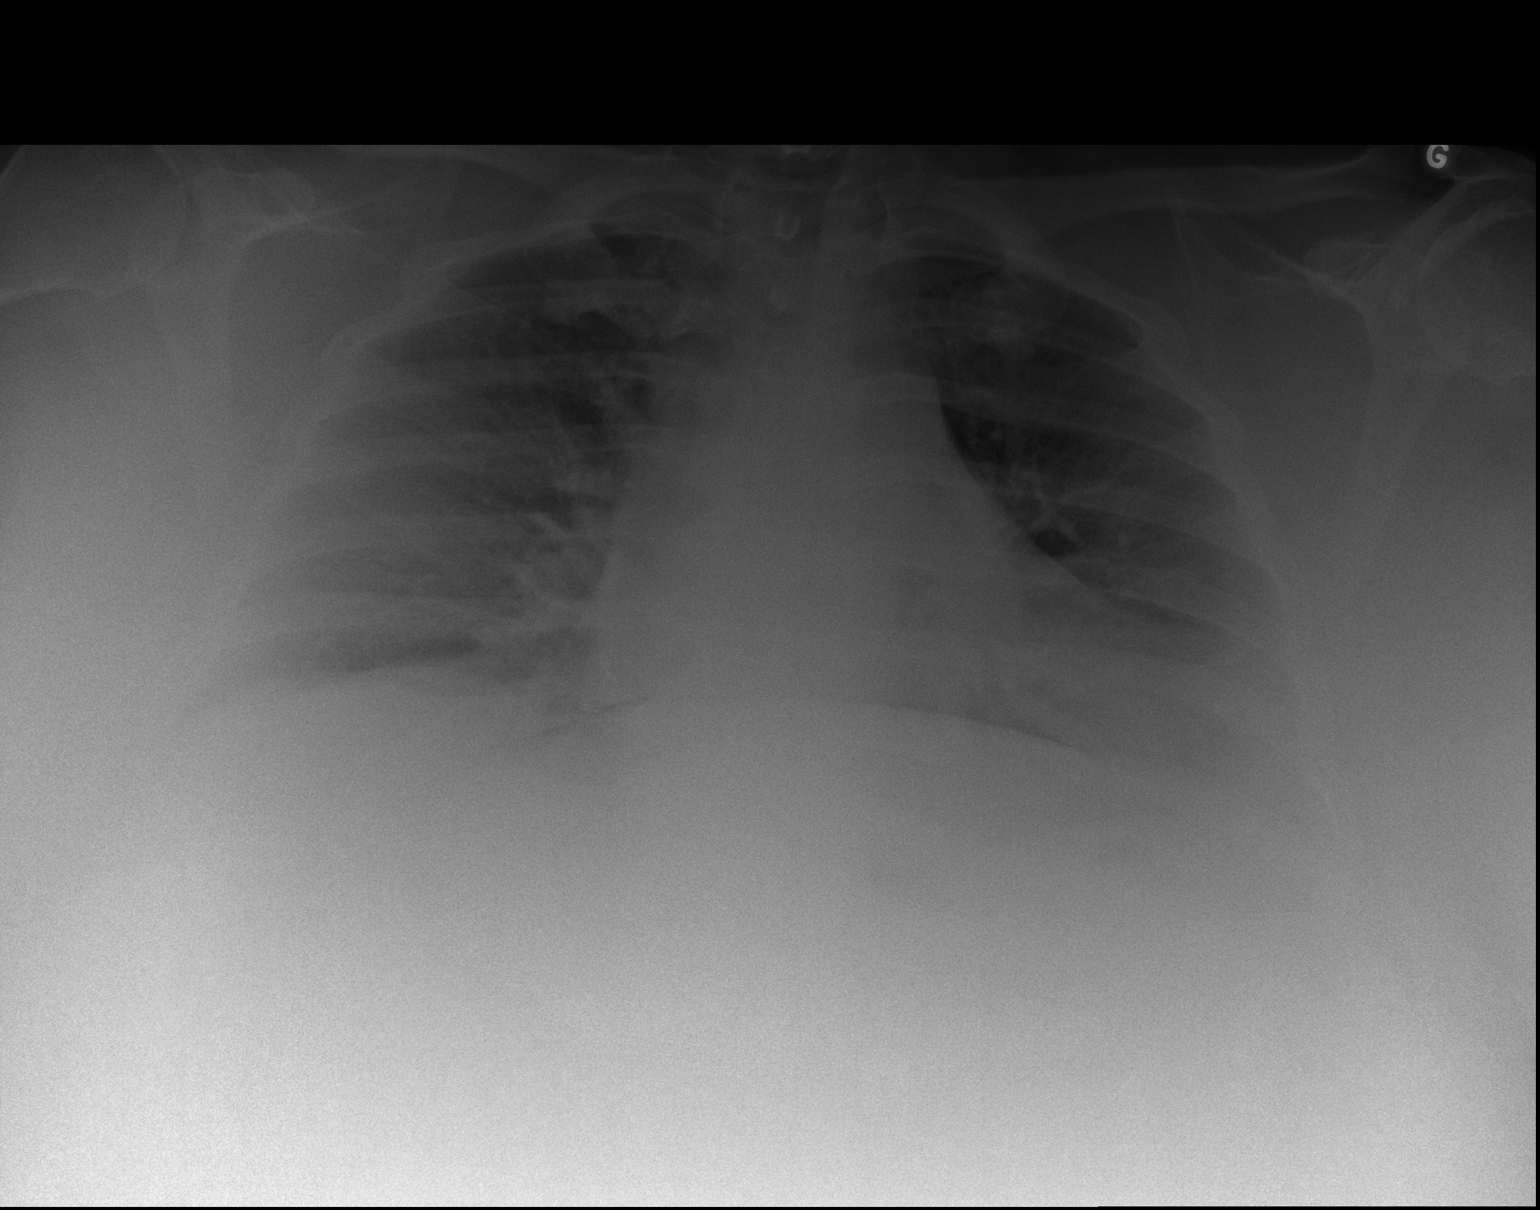

[2 of 2 positions shown; findings below may reference images not displayed]

FINDINGS: Low lung volumes. Morbid obesity. Cardiomegaly. Mild vascular
congestion. Worsening appearance compared with priors.
IMPRESSION: Cardiomegaly. Mild vascular congestion without focal infiltrate.

## 2015-07-29 IMAGING — CR DG CHEST 2V
2 series · 2 of 2 positions shown · non-contrast
Comparison: 01/06/2013

CLINICAL DATA: Shortness of breath, chest pain

EXAM:
CHEST  2 VIEW

[w chest lat]
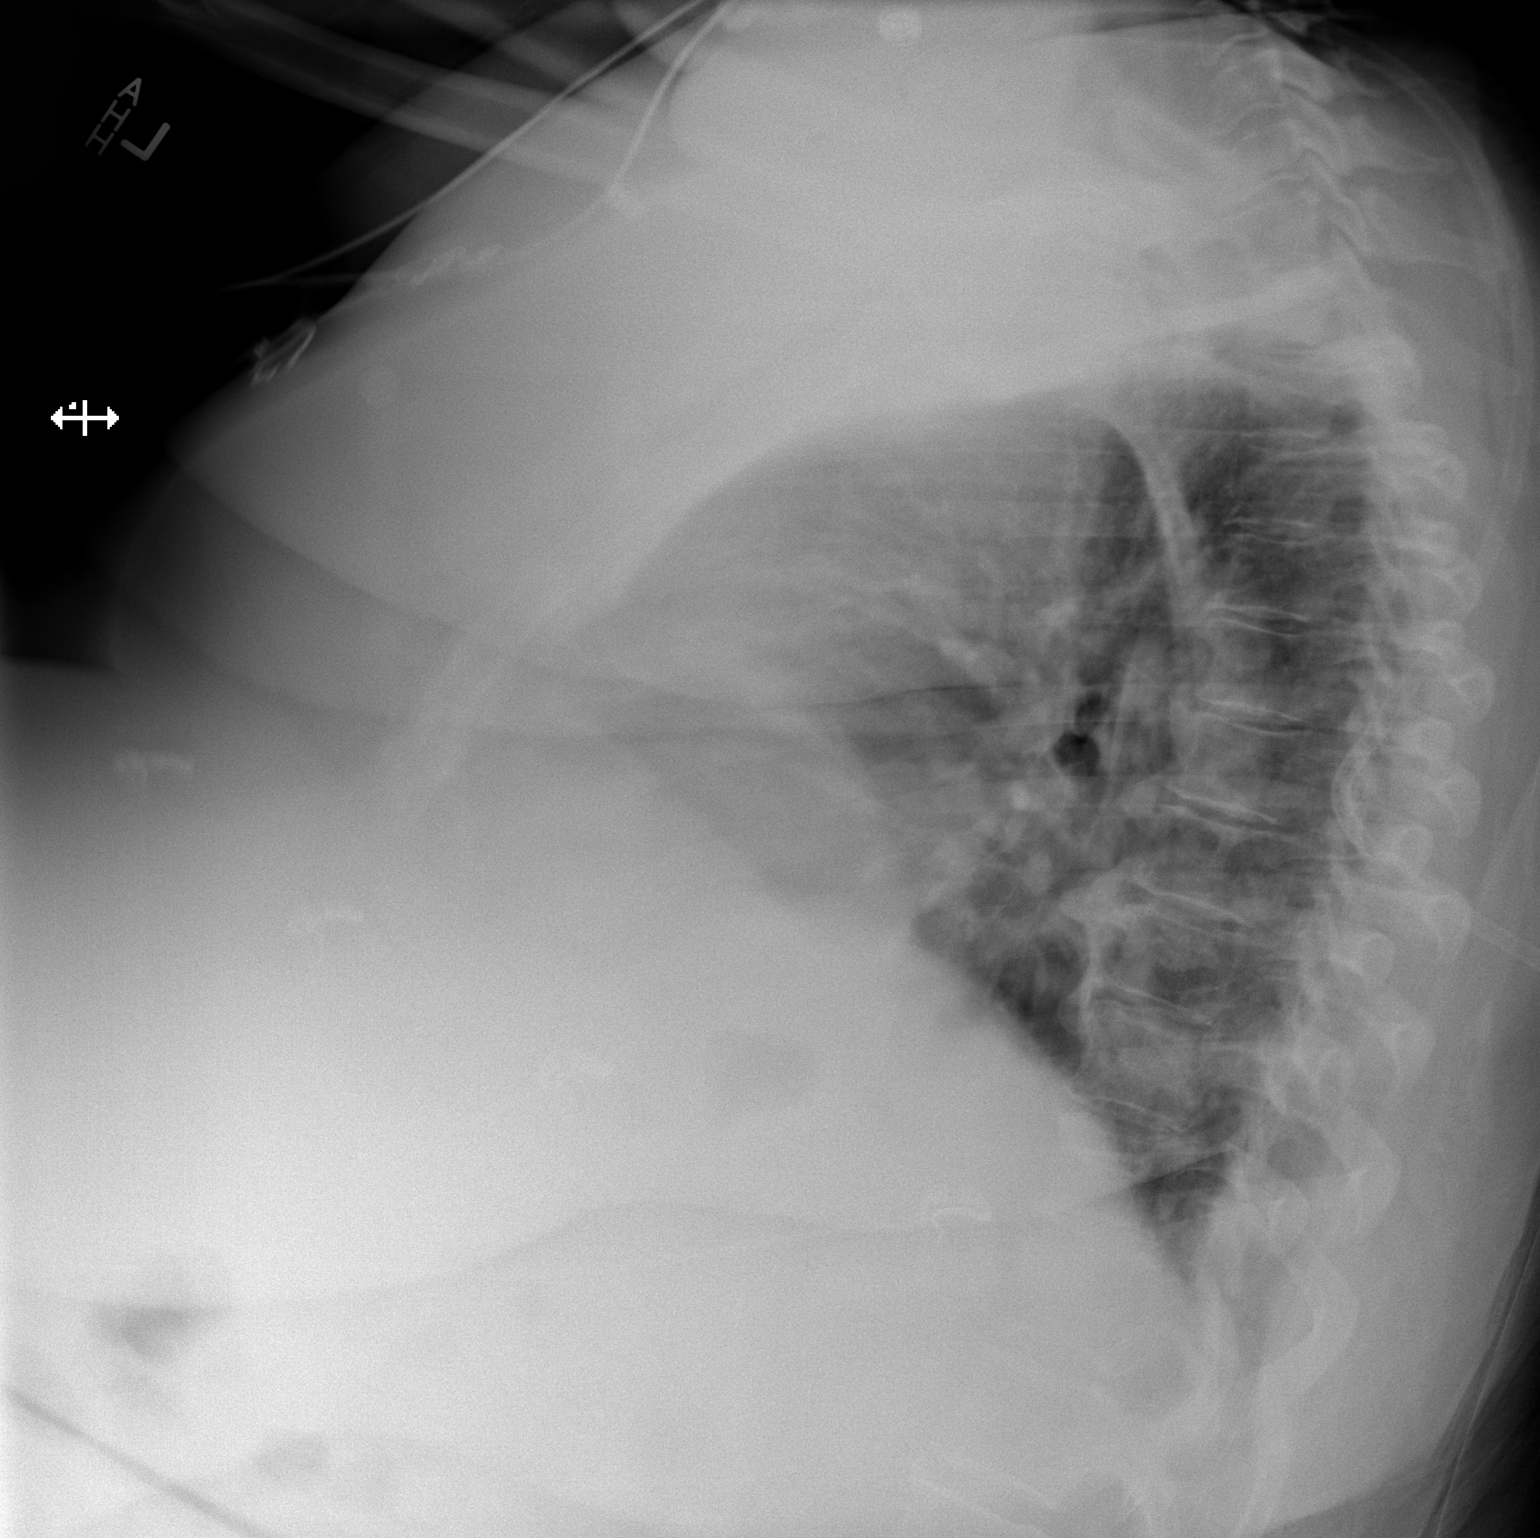

[x chest ap]
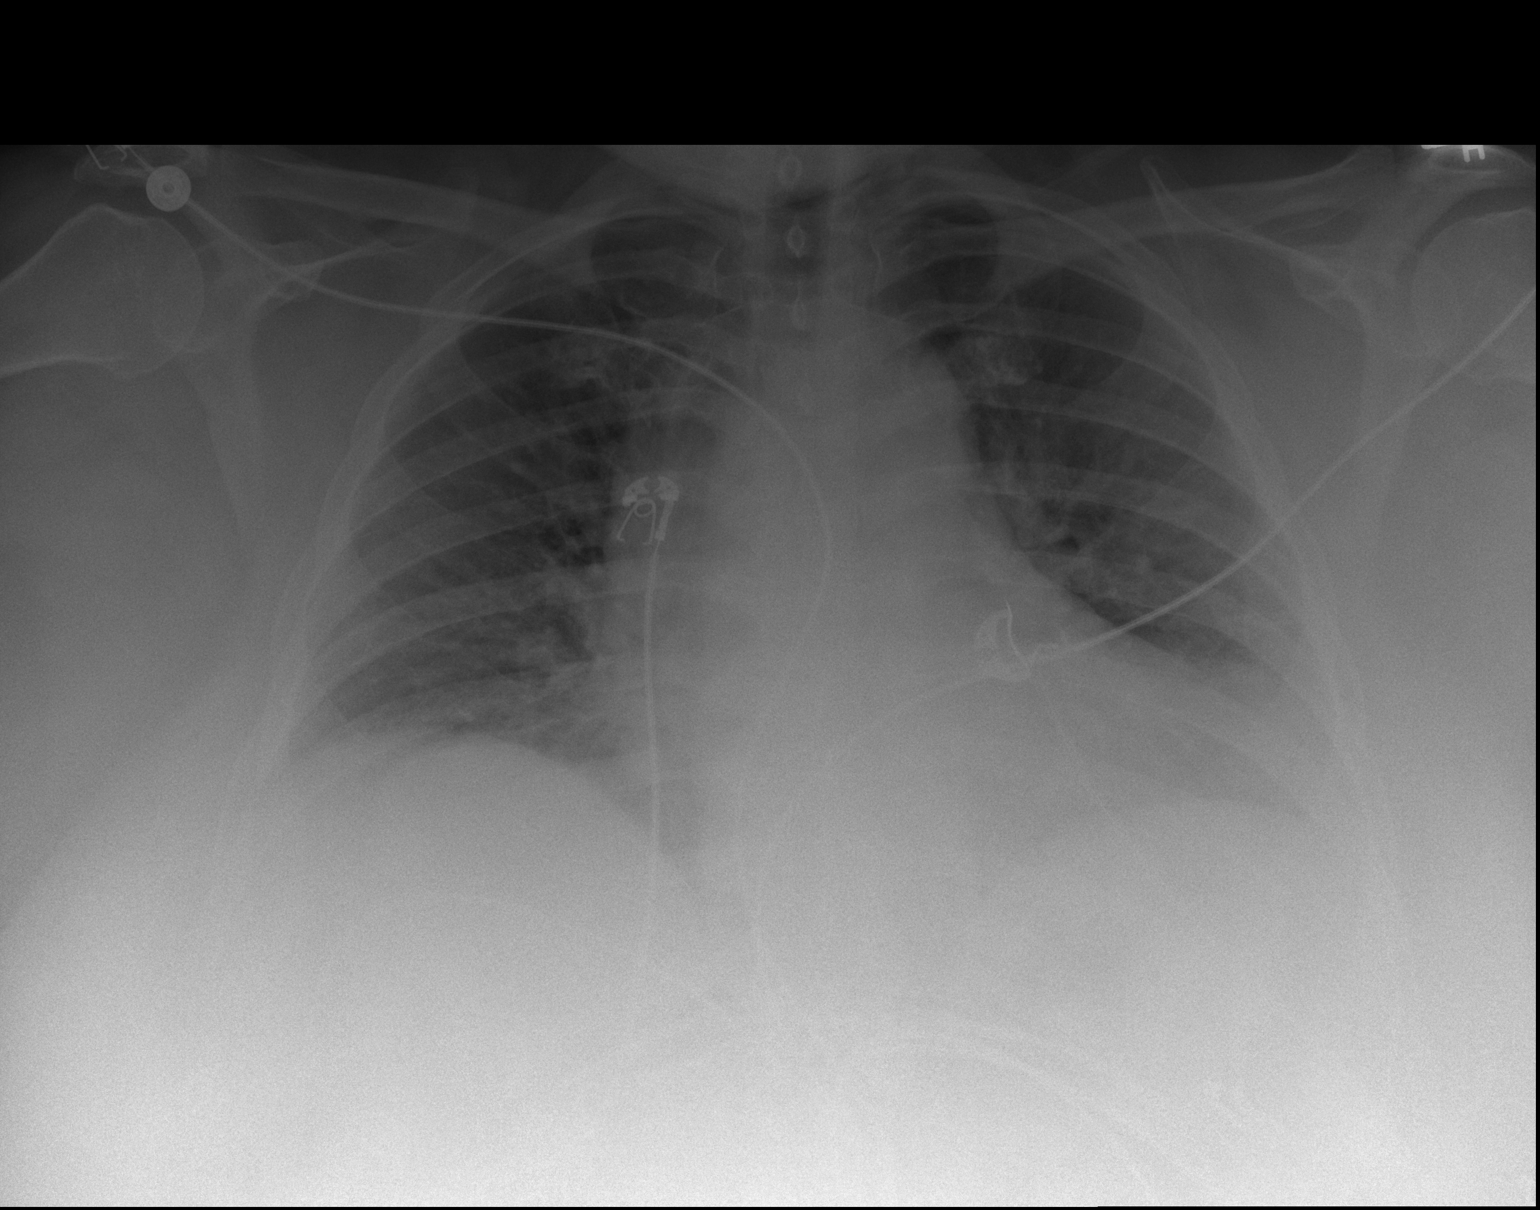

[2 of 2 positions shown; findings below may reference images not displayed]

FINDINGS: Cardiomegaly with vascular congestion. No overt edema. Right base
atelectasis. No effusions. No acute bony abnormality.
IMPRESSION: Cardiomegaly with vascular congestion. Low lung volumes with right
base atelectasis.

## 2015-09-28 ENCOUNTER — Emergency Department (HOSPITAL_COMMUNITY): Payer: Medicaid Other

## 2015-09-28 ENCOUNTER — Encounter (HOSPITAL_COMMUNITY): Payer: Self-pay | Admitting: Emergency Medicine

## 2015-09-28 ENCOUNTER — Emergency Department (HOSPITAL_COMMUNITY)
Admission: EM | Admit: 2015-09-28 | Discharge: 2015-09-28 | Disposition: A | Discharge status: Home / Self Care | Payer: Medicaid Other | Attending: Emergency Medicine | Admitting: Emergency Medicine

## 2015-09-28 DIAGNOSIS — M1990 Unspecified osteoarthritis, unspecified site: Secondary | ICD-10-CM | POA: Diagnosis not present

## 2015-09-28 DIAGNOSIS — N189 Chronic kidney disease, unspecified: Secondary | ICD-10-CM | POA: Insufficient documentation

## 2015-09-28 DIAGNOSIS — R109 Unspecified abdominal pain: Secondary | ICD-10-CM | POA: Diagnosis not present

## 2015-09-28 DIAGNOSIS — R51 Headache: Secondary | ICD-10-CM | POA: Insufficient documentation

## 2015-09-28 DIAGNOSIS — I130 Hypertensive heart and chronic kidney disease with heart failure and stage 1 through stage 4 chronic kidney disease, or unspecified chronic kidney disease: Secondary | ICD-10-CM | POA: Diagnosis not present

## 2015-09-28 DIAGNOSIS — Z79891 Long term (current) use of opiate analgesic: Secondary | ICD-10-CM | POA: Diagnosis not present

## 2015-09-28 DIAGNOSIS — J45909 Unspecified asthma, uncomplicated: Secondary | ICD-10-CM | POA: Diagnosis not present

## 2015-09-28 DIAGNOSIS — E785 Hyperlipidemia, unspecified: Secondary | ICD-10-CM | POA: Diagnosis not present

## 2015-09-28 DIAGNOSIS — R600 Localized edema: Secondary | ICD-10-CM | POA: Insufficient documentation

## 2015-09-28 DIAGNOSIS — Z79899 Other long term (current) drug therapy: Secondary | ICD-10-CM | POA: Diagnosis not present

## 2015-09-28 DIAGNOSIS — I509 Heart failure, unspecified: Secondary | ICD-10-CM | POA: Diagnosis not present

## 2015-09-28 DIAGNOSIS — F17210 Nicotine dependence, cigarettes, uncomplicated: Secondary | ICD-10-CM | POA: Diagnosis not present

## 2015-09-28 DIAGNOSIS — R0602 Shortness of breath: Secondary | ICD-10-CM | POA: Insufficient documentation

## 2015-09-28 DIAGNOSIS — M7989 Other specified soft tissue disorders: Secondary | ICD-10-CM | POA: Diagnosis present

## 2015-09-28 LAB — COMPREHENSIVE METABOLIC PANEL
ALK PHOS: 55 U/L (ref 38–126)
ALT: 13 U/L — ABNORMAL LOW (ref 14–54)
ANION GAP: 5 (ref 5–15)
AST: 16 U/L (ref 15–41)
Albumin: 3.7 g/dL (ref 3.5–5.0)
BILIRUBIN TOTAL: 0.2 mg/dL — AB (ref 0.3–1.2)
BUN: 25 mg/dL — ABNORMAL HIGH (ref 6–20)
CALCIUM: 8.3 mg/dL — AB (ref 8.9–10.3)
CO2: 32 mmol/L (ref 22–32)
Chloride: 103 mmol/L (ref 101–111)
Creatinine, Ser: 1.1 mg/dL — ABNORMAL HIGH (ref 0.44–1.00)
GFR, EST NON AFRICAN AMERICAN: 57 mL/min — AB (ref >60)
Glucose, Bld: 98 mg/dL (ref 65–99)
Potassium: 4.5 mmol/L (ref 3.5–5.1)
Sodium: 140 mmol/L (ref 135–145)
TOTAL PROTEIN: 7.1 g/dL (ref 6.5–8.1)

## 2015-09-28 LAB — CBC WITH DIFFERENTIAL/PLATELET
Basophils Absolute: 0 10*3/uL (ref 0.0–0.1)
Basophils Relative: 0 %
EOS ABS: 0.3 10*3/uL (ref 0.0–0.7)
Eosinophils Relative: 3 %
HEMATOCRIT: 42 % (ref 36.0–46.0)
HEMOGLOBIN: 12.9 g/dL (ref 12.0–15.0)
LYMPHS ABS: 2.3 10*3/uL (ref 0.7–4.0)
Lymphocytes Relative: 20 %
MCH: 28.9 pg (ref 26.0–34.0)
MCHC: 30.7 g/dL (ref 30.0–36.0)
MCV: 94 fL (ref 78.0–100.0)
MONOS PCT: 5 %
Monocytes Absolute: 0.6 10*3/uL (ref 0.1–1.0)
NEUTROS ABS: 8 10*3/uL — AB (ref 1.7–7.7)
NEUTROS PCT: 72 %
Platelets: 245 10*3/uL (ref 150–400)
RBC: 4.47 MIL/uL (ref 3.87–5.11)
RDW: 14.6 % (ref 11.5–15.5)
WBC: 11.2 10*3/uL — ABNORMAL HIGH (ref 4.0–10.5)

## 2015-09-28 LAB — I-STAT CHEM 8, ED
BUN: 30 mg/dL — AB (ref 6–20)
CALCIUM ION: 1.16 mmol/L (ref 1.13–1.30)
CHLORIDE: 99 mmol/L — AB (ref 101–111)
CREATININE: 1.2 mg/dL — AB (ref 0.44–1.00)
Glucose, Bld: 96 mg/dL (ref 65–99)
HCT: 43 % (ref 36.0–46.0)
Hemoglobin: 14.6 g/dL (ref 12.0–15.0)
Potassium: 4.5 mmol/L (ref 3.5–5.1)
Sodium: 142 mmol/L (ref 135–145)
TCO2: 31 mmol/L (ref 0–100)

## 2015-09-28 LAB — BRAIN NATRIURETIC PEPTIDE: B Natriuretic Peptide: 65.2 pg/mL (ref 0.0–100.0)

## 2015-09-28 LAB — I-STAT TROPONIN, ED: Troponin i, poc: 0 ng/mL (ref 0.00–0.08)

## 2015-09-28 MED ORDER — FUROSEMIDE 10 MG/ML IJ SOLN
20.0000 mg | Freq: Once | INTRAMUSCULAR | Status: DC
  Filled 2015-09-28: qty 4

## 2015-09-28 NOTE — ED Notes (Signed)
Pt c/o headache, SOB and lleg swelling for about a week. Pt has hx of CHF, HTN and asthma.

## 2015-09-28 NOTE — ED Provider Notes (Signed)
CSN: [id number]     Arrival date & time 09/28/15  1317 History   First MD Initiated Contact with Patient 09/28/15 1600     Chief Complaint  Patient presents with  . Shortness of Breath  . Leg Swelling  . Headache     (Consider location/radiation/quality/duration/timing/severity/associated sxs/prior Treatment) Patient is a 50 y.o. female presenting with shortness of breath and headaches. The history is provided by the patient.  Shortness of Breath Severity:  Moderate Onset quality:  Gradual Duration:  2 days Timing:  Constant Progression:  Worsening Chronicity:  Chronic Relieved by:  Nothing Worsened by:  Nothing tried Ineffective treatments:  None tried Associated symptoms: abdominal pain   Associated symptoms: no chest pain, no fever, no headaches, no vomiting and no wheezing   Headache Associated symptoms: abdominal pain and fatigue   Associated symptoms: no congestion, no dizziness, no fever, no myalgias, no nausea and no vomiting     50 yo F With a chief complaint of shortness of breath and leg swelling. This been going on for many months but worsening over the past couple days. Patient states that she has heart failure though is a bit vague on her actual diagnosis. She takes 20 mg of Lasix as needed for lower extremity edema. She is on oxygen at home for her morbid obesity. She feels that her shortness of breath has gotten worse over the past few days though is unable to describe things that make it worse. She recently saw her family doctor who suggested she come to the ED if her symptoms do not improve. She has been taking her Lasix with no improvement of her lower extremity edema.  Past Medical History  Diagnosis Date  . Asthma   . CHF (congestive heart failure) ([place])   . Allergy   . Anxiety   . GERD (gastroesophageal reflux disease)   . Sleep apnea   . Hypertension   . Chronic kidney disease   . Oxygen deficiency   . CHF (congestive heart failure) [facility])    Past  Surgical History  Procedure Laterality Date  . Cesarean section    . Fracture surgery     Family History  Problem Relation Age of Onset  . Asthma Mother   . Heart disease Father   . Arthritis Sister   . Asthma Sister   . Asthma Daughter   . Asthma Son   . Arthritis Sister   . Asthma Sister    Social History  Substance Use Topics  . Smoking status: Former Research scientist (life sciences)  . Smokeless tobacco: Former Systems developer  . Alcohol Use: 0.0 oz/week    0 Standard drinks or equivalent per week     Comment: years ago  no longer beer only  early 20's   OB History    No data available     Review of Systems  Constitutional: Positive for fatigue. Negative for fever and chills.  HENT: Negative for congestion and rhinorrhea.   Eyes: Negative for redness and visual disturbance.  Respiratory: Positive for shortness of breath. Negative for wheezing.   Cardiovascular: Negative for chest pain and palpitations.  Gastrointestinal: Positive for abdominal pain. Negative for nausea and vomiting.  Genitourinary: Negative for dysuria and urgency.  Musculoskeletal: Negative for myalgias and arthralgias.  Skin: Negative for pallor and wound.  Neurological: Negative for dizziness and headaches.      Allergies  Penicillins; Sulfa antibiotics; Doxycycline; Ibuprofen; Zithromax; and Adhesive  Home Medications   Prior to Admission medications   Medication  Sig Start Date End Date Taking? Authorizing Provider  albuterol (PROVENTIL) (2.5 MG/3ML) 0.083% nebulizer solution Take 2.5 mg by nebulization every 6 (six) hours as needed for wheezing or shortness of breath.   Yes Historical Provider, MD  allopurinol (ZYLOPRIM) 100 MG tablet Take 100 mg by mouth daily.   Yes Historical Provider, MD  ALPRAZolam Duanne Moron) 1 MG tablet Take 1 mg by mouth at bedtime as needed for anxiety.   Yes Historical Provider, MD  amLODipine-benazepril (LOTREL) 5-10 MG capsule Take 1 capsule by mouth daily.   Yes Historical Provider, MD  cloNIDine  (CATAPRES) 0.2 MG tablet Take 0.2 mg by mouth 2 (two) times daily.   Yes Historical Provider, MD  diclofenac (VOLTAREN) 75 MG EC tablet Take 75 mg by mouth 2 (two) times daily.   Yes Historical Provider, MD  folic acid (FOLVITE) 1 MG tablet Take 1 mg by mouth daily.   Yes Historical Provider, MD  gabapentin (NEURONTIN) 300 MG capsule Take 300 mg by mouth 3 (three) times daily.   Yes Historical Provider, MD  HYDROcodone-acetaminophen (NORCO) 10-325 MG tablet Take 1 tablet by mouth every 4 (four) hours as needed for moderate pain.  09/13/15  Yes Historical Provider, MD  hydrOXYzine (ATARAX/VISTARIL) 25 MG tablet Take 25 mg by mouth 3 (three) times daily as needed for anxiety.  07/27/15  Yes Historical Provider, MD  indomethacin (INDOCIN SR) 75 MG CR capsule Take 75 mg by mouth 2 (two) times daily with a meal.   Yes Historical Provider, MD  isosorbide mononitrate (IMDUR) 30 MG 24 hr tablet Take 30 mg by mouth daily. 07/27/15  Yes Historical Provider, MD  lisinopril (PRINIVIL,ZESTRIL) 40 MG tablet Take 40 mg by mouth daily.   Yes Historical Provider, MD  naproxen (NAPROSYN) 500 MG tablet Take 500 mg by mouth 2 (two) times daily with a meal.   Yes Historical Provider, MD  Omega-3 Fatty Acids (FISH OIL PO) Take 1 capsule by mouth 2 (two) times daily.   Yes Historical Provider, MD  solifenacin (VESICARE) 5 MG tablet Take 5 mg by mouth daily.   Yes Historical Provider, MD  topiramate (TOPAMAX) 100 MG tablet Take 100 mg by mouth 2 (two) times daily.   Yes Historical Provider, MD  VITAMIN E PO Take 1 capsule by mouth daily.   Yes Historical Provider, MD   BP 128/76 mmHg  Pulse 76  Temp(Src) 98.6 F (37 C) (Oral)  Resp 16  SpO2 93% Physical Exam  Constitutional: She is oriented to person, place, and time. She appears well-developed and well-nourished. No distress.  Morbid obese  HENT:  Head: Normocephalic and atraumatic.  Eyes: EOM are normal. Pupils are equal, round, and reactive to light.  Neck: Normal  range of motion. Neck supple.  Cardiovascular: Normal rate and regular rhythm.  Exam reveals no gallop and no friction rub.   No murmur heard. Pulmonary/Chest: Effort normal. She has no wheezes. She has no rales.  Abdominal: Soft. She exhibits no distension. There is no tenderness. There is no rebound and no guarding.  Musculoskeletal: She exhibits no edema or tenderness.  Neurological: She is alert and oriented to person, place, and time.  Skin: Skin is warm and dry. She is not diaphoretic.  Psychiatric: She has a normal mood and affect. Her behavior is normal.  Nursing note and vitals reviewed.   ED Course  Procedures (including critical care time) Labs Review Labs Reviewed  CBC WITH DIFFERENTIAL/PLATELET - Abnormal; Notable for the following:  WBC 11.2 (*)    Neutro Abs 8.0 (*)    All other components within normal limits  COMPREHENSIVE METABOLIC PANEL - Abnormal; Notable for the following:    BUN 25 (*)    Creatinine, Ser 1.10 (*)    Calcium 8.3 (*)    ALT 13 (*)    Total Bilirubin 0.2 (*)    GFR calc non Af Amer 57 (*)    All other components within normal limits  I-STAT CHEM 8, ED - Abnormal; Notable for the following:    Chloride 99 (*)    BUN 30 (*)    Creatinine, Ser 1.20 (*)    All other components within normal limits  BRAIN NATRIURETIC PEPTIDE  I-STAT TROPOININ, ED    Imaging Review Dg Chest 2 View  09/28/2015  CLINICAL DATA:  Shortness of breath and left chest pain EXAM: CHEST  2 VIEW COMPARISON:  None. FINDINGS: Mild enlargement of the cardiac silhouette. Smooth prominence of the bilateral paratracheal mediastinal contour. No pneumothorax. No pleural effusion. Lungs appear clear, with no acute consolidative airspace disease and no pulmonary edema. IMPRESSION: 1. Mild enlargement of the cardiac silhouette. No pulmonary edema. No active pulmonary disease. 2. Smooth prominence of the bilateral paratracheal mediastinal contour, cannot exclude mediastinal  lymphadenopathy. Recommend further evaluation with chest CT with IV contrast. Electronically Signed   By: Ilona Sorrel M.D.   On: 09/28/2015 14:24   I have personally reviewed and evaluated these images and lab results as part of my medical decision-making.   EKG Interpretation   Date/Time:  Friday September 28 2015 13:43:36 EDT Ventricular Rate:  62 PR Interval:    QRS Duration: 98 QT Interval:  418 QTC Calculation: 425 R Axis:   63 Text Interpretation:  Sinus rhythm Atrial premature complexes Low voltage,  precordial leads No old tracing to compare Confirmed by Lennell Shanks MD, DANIEL  [phone]) on 09/28/2015 4:01:09 PM      MDM   Final diagnoses:  Bilateral edema of lower extremity    50 yo F With a chief complaint of shortness of breath and leg swelling. Patient's chest x-ray with possible mild enlargement of the cardiac silhouette. She has no signs of fluid overload. I appreciate no edema peripherally though difficult with her morbid obesity.  5:09 PM:  I have discussed the diagnosis/risks/treatment options with the patient and believe the pt to be eligible for discharge home to follow-up with PCP. We also discussed returning to the ED immediately if new or worsening sx occur. We discussed the sx which are most concerning (e.g., sudden worsening pain, fever, inability to tolerate by mouth) that necessitate immediate return. Medications administered to the patient during their visit and any new prescriptions provided to the patient are listed below.  Medications given during this visit Medications  furosemide (LASIX) injection 20 mg (not administered)    New Prescriptions   No medications on file    The patient appears reasonably screen and/or stabilized for discharge and I doubt any other medical condition or other [facility] requiring further screening, evaluation, or treatment in the ED at this time prior to discharge.      Deno Etienne, DO 09/28/15 1709

## 2015-09-28 NOTE — Discharge Instructions (Signed)
Discuss your swelling in legs with her family physician. Return for sudden worsening shortness of breath. X-ray was concerning for swollen lymph nodes. Discussed this with her family doctor for an outpatient CT scan of the chest.  Peripheral Edema You have swelling in your legs (peripheral edema). This swelling is due to excess accumulation of salt and water in your body. Edema may be a sign of heart, kidney or liver disease, or a side effect of a medication. It may also be due to problems in the leg veins. Elevating your legs and using special support stockings may be very helpful, if the cause of the swelling is due to poor venous circulation. Avoid long periods of standing, whatever the cause. Treatment of edema depends on identifying the cause. Chips, pretzels, pickles and other salty foods should be avoided. Restricting salt in your diet is almost always needed. Water pills (diuretics) are often used to remove the excess salt and water from your body via urine. These medicines prevent the kidney from reabsorbing sodium. This increases urine flow. Diuretic treatment may also result in lowering of potassium levels in your body. Potassium supplements may be needed if you have to use diuretics daily. Daily weights can help you keep track of your progress in clearing your edema. You should call your caregiver for follow up care as recommended. SEEK IMMEDIATE MEDICAL CARE IF:   You have increased swelling, pain, redness, or heat in your legs.  You develop shortness of breath, especially when lying down.  You develop chest or abdominal pain, weakness, or fainting.  You have a fever.   This information is not intended to replace advice given to you by your health care provider. Make sure you discuss any questions you have with your health care provider.   Document Released: 04/17/2004 Document Revised: 06/02/2011 Document Reviewed: 09/20/2014 Elsevier Interactive Patient Education [employer].

## 2015-10-01 ENCOUNTER — Encounter (HOSPITAL_COMMUNITY): Payer: Self-pay | Admitting: Family Medicine

## 2015-11-01 IMAGING — CR DG CHEST 2V
2 series · 2 of 2 positions shown · non-contrast
Comparison: 01/09/2013.

CLINICAL DATA: Shortness breath. Morbid obesity. High blood
pressure.

EXAM:
CHEST  2 VIEW

[w chest pa]
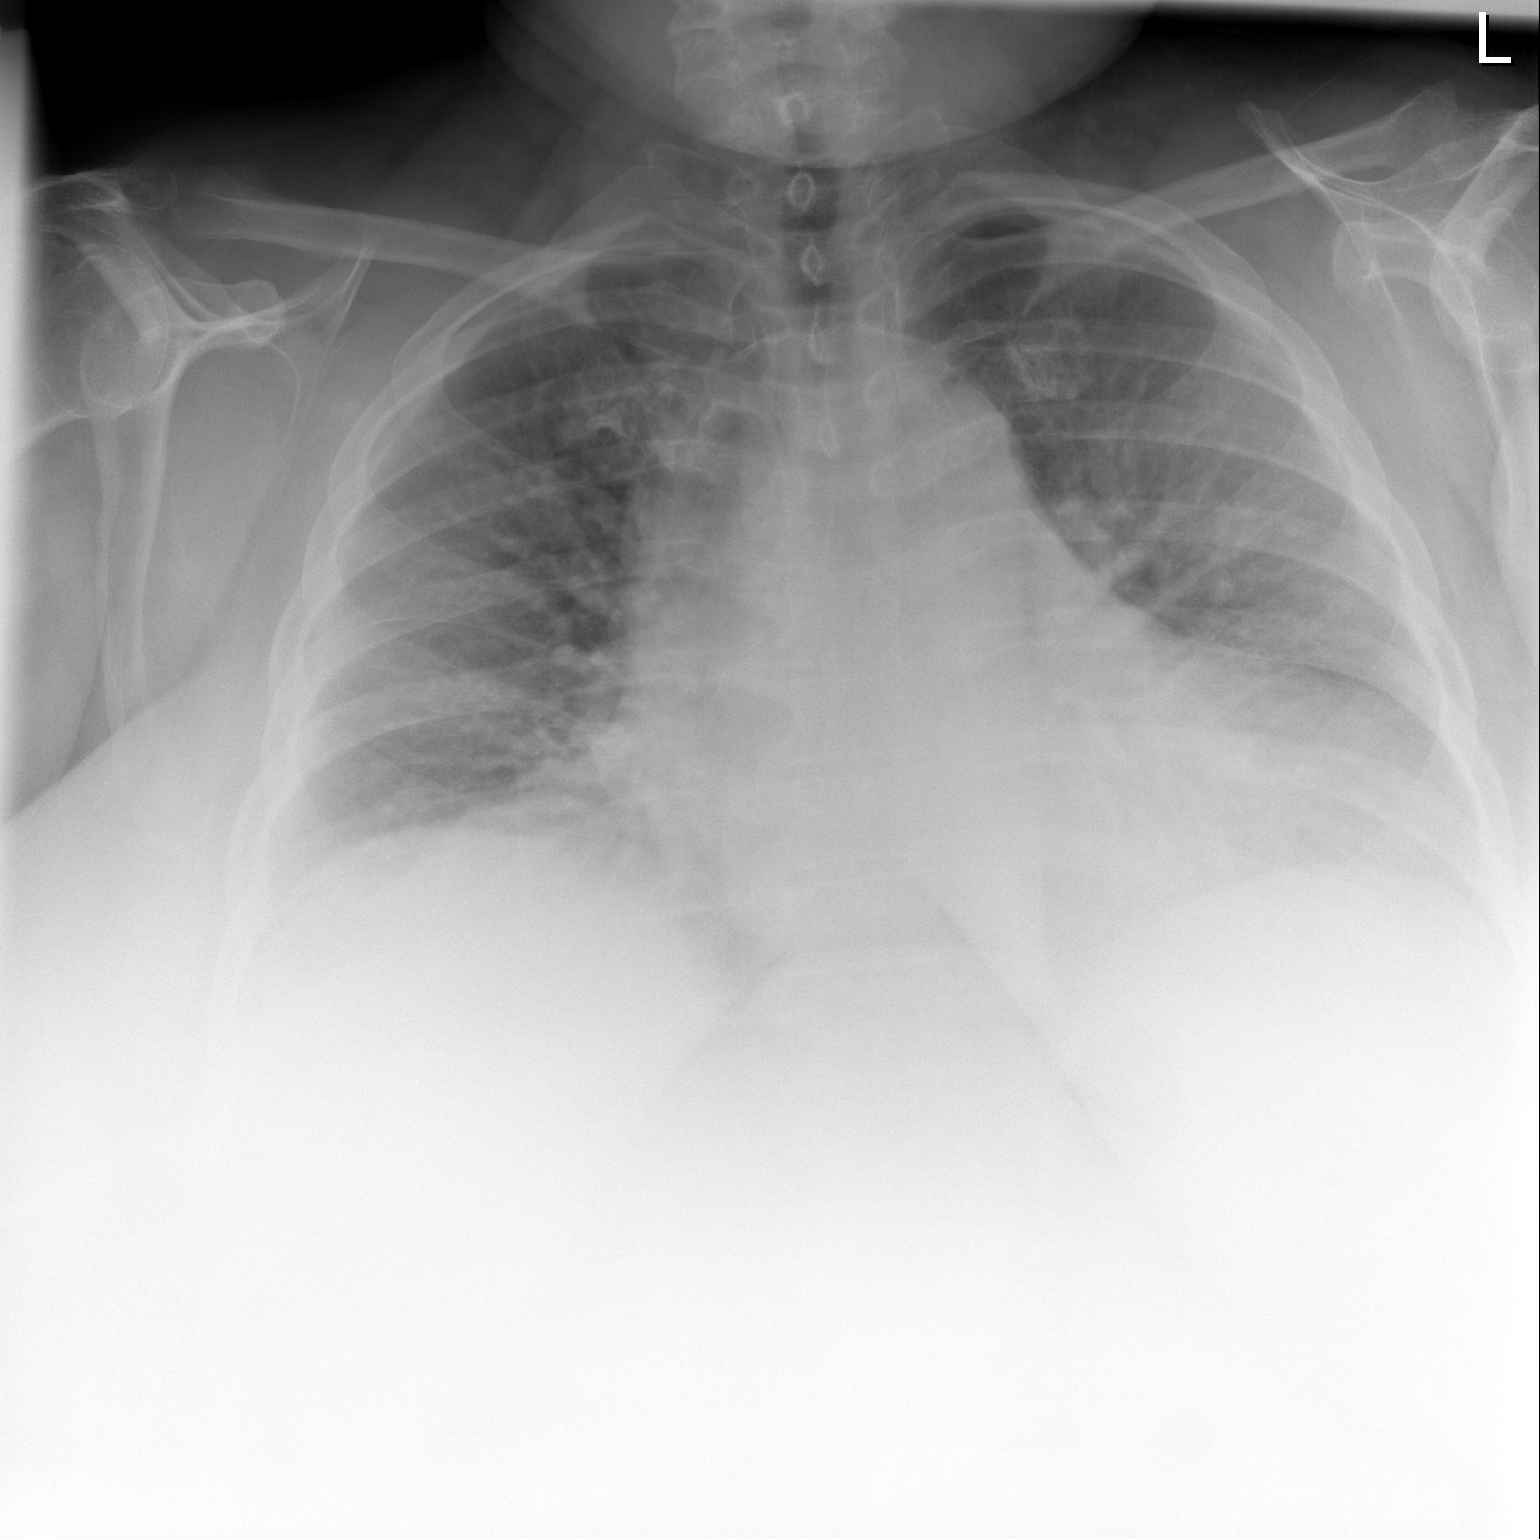

[w chest lat]
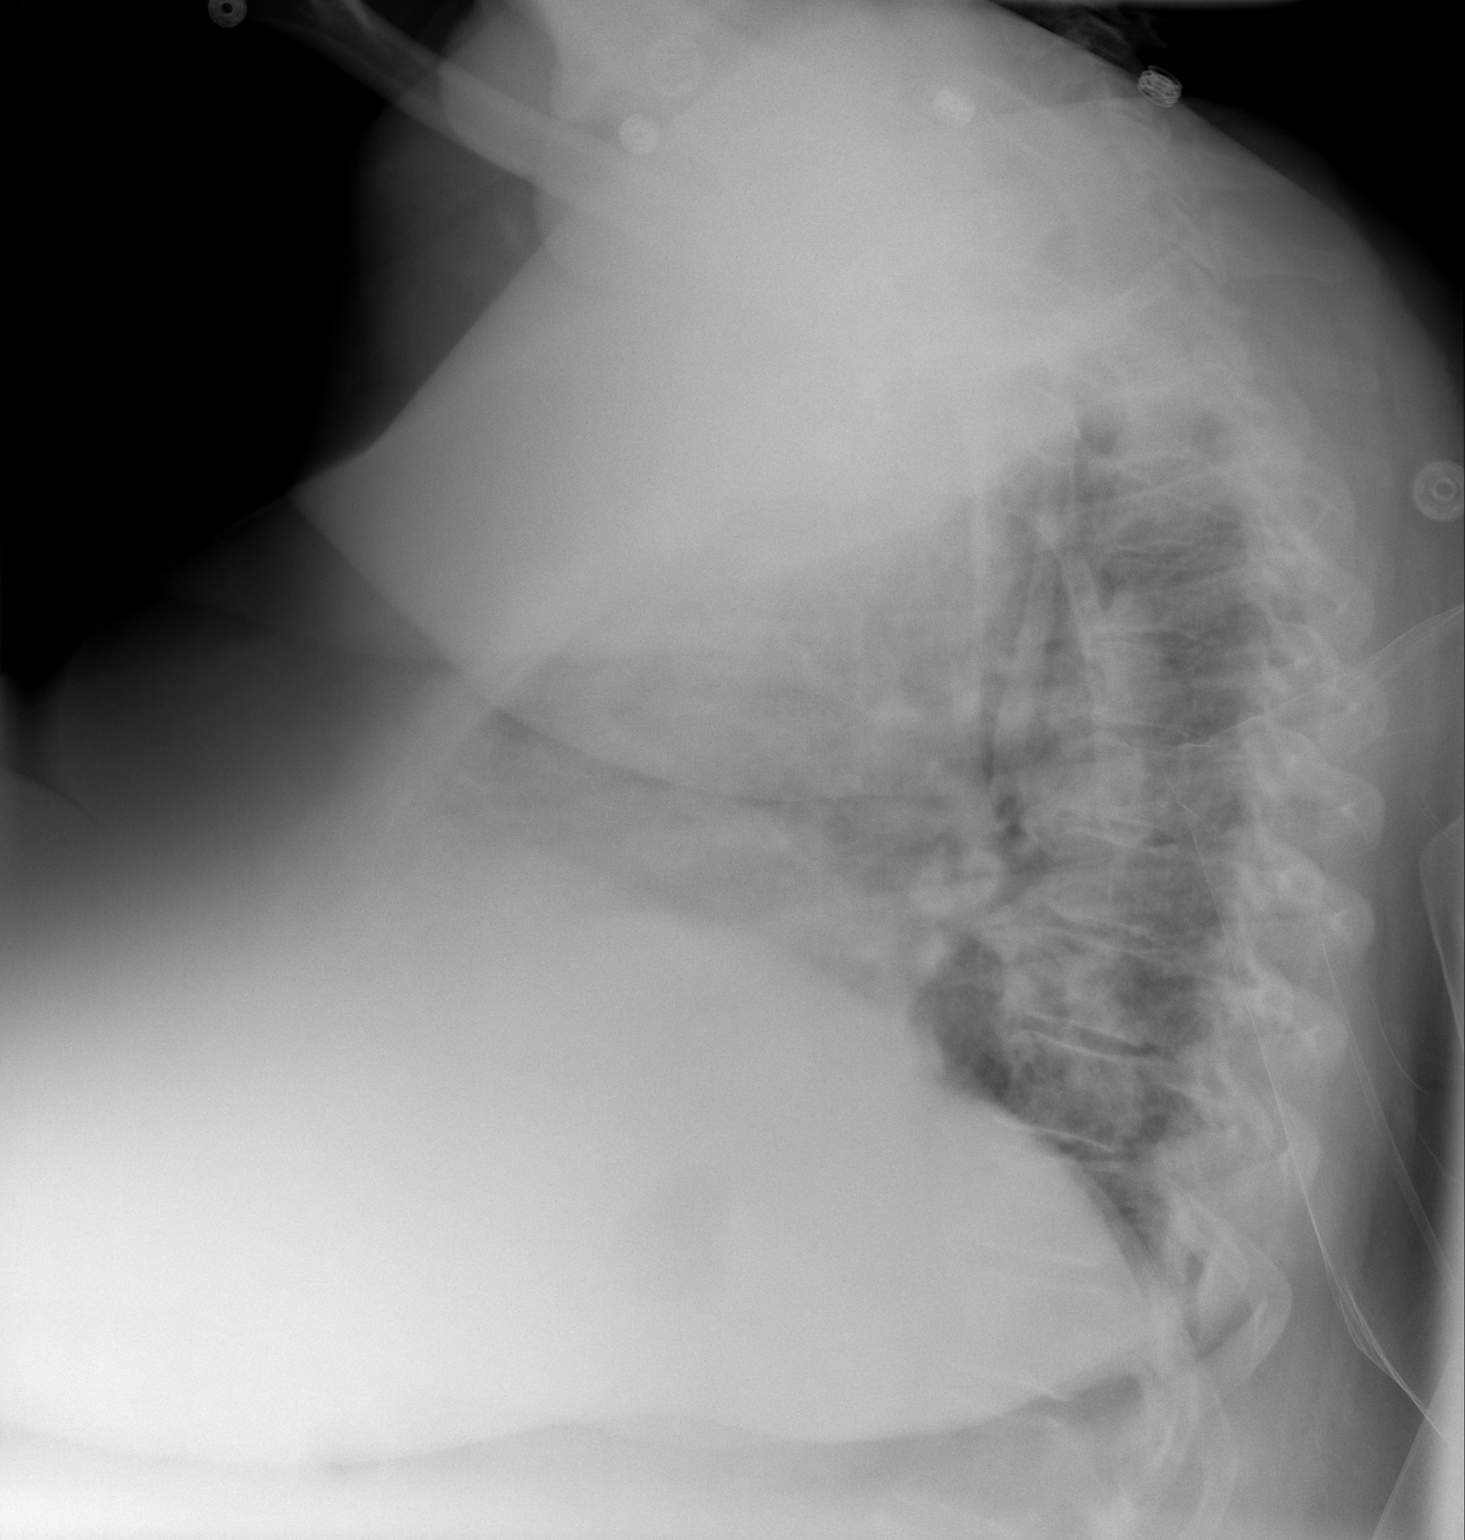

[2 of 2 positions shown; findings below may reference images not displayed]

FINDINGS: Cardiomegaly.

Slightly poor inspiration.

Pulmonary vascular congestion.

No segmental consolidation or pneumothorax.

Mediastinum similar to prior exam with limited evaluation secondary
to central pulmonary vascular prominence and cardiomegaly. Slightly
tortuous ascending thoracic aorta the suspected.

Mild degenerative changes thoracic spine.
IMPRESSION: Slightly poor inspiratory exam with cardiomegaly and pulmonary
vascular congestion. Appearance similar to prior exam. Please see
above.

## 2015-12-13 ENCOUNTER — Inpatient Hospital Stay (HOSPITAL_COMMUNITY)
Admission: EM | Admit: 2015-12-13 | Discharge: 2015-12-24 | DRG: 291 | Disposition: A | Discharge status: Home / Self Care | Payer: Medicaid Other | Attending: Internal Medicine | Admitting: Internal Medicine

## 2015-12-13 ENCOUNTER — Encounter (HOSPITAL_COMMUNITY): Payer: Self-pay | Admitting: Emergency Medicine

## 2015-12-13 ENCOUNTER — Emergency Department (HOSPITAL_COMMUNITY): Payer: Medicaid Other

## 2015-12-13 DIAGNOSIS — Z882 Allergy status to sulfonamides status: Secondary | ICD-10-CM

## 2015-12-13 DIAGNOSIS — Z825 Family history of asthma and other chronic lower respiratory diseases: Secondary | ICD-10-CM

## 2015-12-13 DIAGNOSIS — Z09 Encounter for follow-up examination after completed treatment for conditions other than malignant neoplasm: Secondary | ICD-10-CM

## 2015-12-13 DIAGNOSIS — I2721 Secondary pulmonary arterial hypertension: Secondary | ICD-10-CM | POA: Diagnosis present

## 2015-12-13 DIAGNOSIS — N179 Acute kidney failure, unspecified: Secondary | ICD-10-CM | POA: Diagnosis present

## 2015-12-13 DIAGNOSIS — Z993 Dependence on wheelchair: Secondary | ICD-10-CM

## 2015-12-13 DIAGNOSIS — E662 Morbid (severe) obesity with alveolar hypoventilation: Secondary | ICD-10-CM | POA: Diagnosis present

## 2015-12-13 DIAGNOSIS — Z87891 Personal history of nicotine dependence: Secondary | ICD-10-CM

## 2015-12-13 DIAGNOSIS — Z888 Allergy status to other drugs, medicaments and biological substances status: Secondary | ICD-10-CM

## 2015-12-13 DIAGNOSIS — Z823 Family history of stroke: Secondary | ICD-10-CM

## 2015-12-13 DIAGNOSIS — I5033 Acute on chronic diastolic (congestive) heart failure: Secondary | ICD-10-CM | POA: Diagnosis present

## 2015-12-13 DIAGNOSIS — F419 Anxiety disorder, unspecified: Secondary | ICD-10-CM | POA: Diagnosis present

## 2015-12-13 DIAGNOSIS — J810 Acute pulmonary edema: Secondary | ICD-10-CM

## 2015-12-13 DIAGNOSIS — Z886 Allergy status to analgesic agent status: Secondary | ICD-10-CM

## 2015-12-13 DIAGNOSIS — F329 Major depressive disorder, single episode, unspecified: Secondary | ICD-10-CM | POA: Diagnosis present

## 2015-12-13 DIAGNOSIS — G4733 Obstructive sleep apnea (adult) (pediatric): Secondary | ICD-10-CM | POA: Diagnosis present

## 2015-12-13 DIAGNOSIS — K219 Gastro-esophageal reflux disease without esophagitis: Secondary | ICD-10-CM | POA: Diagnosis present

## 2015-12-13 DIAGNOSIS — R519 Headache, unspecified: Secondary | ICD-10-CM

## 2015-12-13 DIAGNOSIS — J189 Pneumonia, unspecified organism: Secondary | ICD-10-CM | POA: Diagnosis present

## 2015-12-13 DIAGNOSIS — N183 Chronic kidney disease, stage 3 unspecified: Secondary | ICD-10-CM | POA: Diagnosis present

## 2015-12-13 DIAGNOSIS — R0602 Shortness of breath: Secondary | ICD-10-CM

## 2015-12-13 DIAGNOSIS — I10 Essential (primary) hypertension: Secondary | ICD-10-CM | POA: Diagnosis present

## 2015-12-13 DIAGNOSIS — Z8249 Family history of ischemic heart disease and other diseases of the circulatory system: Secondary | ICD-10-CM

## 2015-12-13 DIAGNOSIS — M109 Gout, unspecified: Secondary | ICD-10-CM | POA: Diagnosis present

## 2015-12-13 DIAGNOSIS — E785 Hyperlipidemia, unspecified: Secondary | ICD-10-CM | POA: Diagnosis present

## 2015-12-13 DIAGNOSIS — J45901 Unspecified asthma with (acute) exacerbation: Secondary | ICD-10-CM | POA: Diagnosis present

## 2015-12-13 DIAGNOSIS — Z8261 Family history of arthritis: Secondary | ICD-10-CM

## 2015-12-13 DIAGNOSIS — I130 Hypertensive heart and chronic kidney disease with heart failure and stage 1 through stage 4 chronic kidney disease, or unspecified chronic kidney disease: Principal | ICD-10-CM | POA: Diagnosis present

## 2015-12-13 DIAGNOSIS — I50810 Right heart failure, unspecified: Secondary | ICD-10-CM

## 2015-12-13 DIAGNOSIS — R51 Headache: Secondary | ICD-10-CM

## 2015-12-13 DIAGNOSIS — J9622 Acute and chronic respiratory failure with hypercapnia: Secondary | ICD-10-CM

## 2015-12-13 DIAGNOSIS — J9621 Acute and chronic respiratory failure with hypoxia: Secondary | ICD-10-CM | POA: Diagnosis present

## 2015-12-13 DIAGNOSIS — R079 Chest pain, unspecified: Secondary | ICD-10-CM

## 2015-12-13 DIAGNOSIS — Z6841 Body Mass Index (BMI) 40.0 and over, adult: Secondary | ICD-10-CM

## 2015-12-13 DIAGNOSIS — Z9119 Patient's noncompliance with other medical treatment and regimen: Secondary | ICD-10-CM

## 2015-12-13 DIAGNOSIS — E872 Acidosis: Secondary | ICD-10-CM | POA: Diagnosis present

## 2015-12-13 DIAGNOSIS — Z881 Allergy status to other antibiotic agents status: Secondary | ICD-10-CM

## 2015-12-13 DIAGNOSIS — Z833 Family history of diabetes mellitus: Secondary | ICD-10-CM

## 2015-12-13 DIAGNOSIS — Z880 Allergy status to penicillin: Secondary | ICD-10-CM

## 2015-12-13 DIAGNOSIS — Q828 Other specified congenital malformations of skin: Secondary | ICD-10-CM

## 2015-12-13 DIAGNOSIS — Z9981 Dependence on supplemental oxygen: Secondary | ICD-10-CM

## 2015-12-13 DIAGNOSIS — R0609 Other forms of dyspnea: Secondary | ICD-10-CM

## 2015-12-13 HISTORY — DX: Hyperlipidemia, unspecified: E78.5

## 2015-12-13 LAB — BASIC METABOLIC PANEL
ANION GAP: 9 (ref 5–15)
BUN: 27 mg/dL — ABNORMAL HIGH (ref 6–20)
CHLORIDE: 102 mmol/L (ref 101–111)
CO2: 29 mmol/L (ref 22–32)
Calcium: 8.8 mg/dL — ABNORMAL LOW (ref 8.9–10.3)
Creatinine, Ser: 1.26 mg/dL — ABNORMAL HIGH (ref 0.44–1.00)
GFR calc non Af Amer: 48 mL/min — ABNORMAL LOW (ref >60)
GFR, EST AFRICAN AMERICAN: 56 mL/min — AB (ref >60)
Glucose, Bld: 120 mg/dL — ABNORMAL HIGH (ref 65–99)
POTASSIUM: 4.4 mmol/L (ref 3.5–5.1)
SODIUM: 140 mmol/L (ref 135–145)

## 2015-12-13 LAB — CBC
HEMATOCRIT: 41.9 % (ref 36.0–46.0)
HEMOGLOBIN: 13.2 g/dL (ref 12.0–15.0)
MCH: 29.5 pg (ref 26.0–34.0)
MCHC: 31.5 g/dL (ref 30.0–36.0)
MCV: 93.7 fL (ref 78.0–100.0)
PLATELETS: 239 10*3/uL (ref 150–400)
RBC: 4.47 MIL/uL (ref 3.87–5.11)
RDW: 14.7 % (ref 11.5–15.5)
WBC: 10.6 10*3/uL — AB (ref 4.0–10.5)

## 2015-12-13 LAB — I-STAT TROPONIN, ED: Troponin i, poc: 0 ng/mL (ref 0.00–0.08)

## 2015-12-13 NOTE — ED Triage Notes (Addendum)
Pt from home with complaints of SOB and CP that began 1 week ago. Pt states she has central chest pain that does not radiate anywhere. Pt has no complaints of lightheadedness or dizziness. Pt is breathing wit no difficulty and respirations are unlabored. Pt has mild lower lung wheezes. Pt has normal heart sounds and strong bilateral radial pulses. Pt states she has had lower leg swelling x 1 week. Pt has adequate pulses in both lower extremities

## 2015-12-14 ENCOUNTER — Observation Stay (HOSPITAL_BASED_OUTPATIENT_CLINIC_OR_DEPARTMENT_OTHER): Payer: Medicaid Other

## 2015-12-14 ENCOUNTER — Encounter (HOSPITAL_COMMUNITY): Payer: Self-pay | Admitting: Internal Medicine

## 2015-12-14 ENCOUNTER — Observation Stay (HOSPITAL_COMMUNITY): Payer: Medicaid Other

## 2015-12-14 DIAGNOSIS — I2721 Secondary pulmonary arterial hypertension: Secondary | ICD-10-CM | POA: Diagnosis present

## 2015-12-14 DIAGNOSIS — I5033 Acute on chronic diastolic (congestive) heart failure: Secondary | ICD-10-CM | POA: Diagnosis present

## 2015-12-14 DIAGNOSIS — I130 Hypertensive heart and chronic kidney disease with heart failure and stage 1 through stage 4 chronic kidney disease, or unspecified chronic kidney disease: Secondary | ICD-10-CM | POA: Diagnosis present

## 2015-12-14 DIAGNOSIS — M109 Gout, unspecified: Secondary | ICD-10-CM | POA: Diagnosis present

## 2015-12-14 DIAGNOSIS — J810 Acute pulmonary edema: Secondary | ICD-10-CM | POA: Diagnosis present

## 2015-12-14 DIAGNOSIS — I10 Essential (primary) hypertension: Secondary | ICD-10-CM | POA: Diagnosis present

## 2015-12-14 DIAGNOSIS — Z881 Allergy status to other antibiotic agents status: Secondary | ICD-10-CM | POA: Diagnosis not present

## 2015-12-14 DIAGNOSIS — I509 Heart failure, unspecified: Secondary | ICD-10-CM

## 2015-12-14 DIAGNOSIS — Z6841 Body Mass Index (BMI) 40.0 and over, adult: Secondary | ICD-10-CM | POA: Diagnosis not present

## 2015-12-14 DIAGNOSIS — F419 Anxiety disorder, unspecified: Secondary | ICD-10-CM | POA: Diagnosis present

## 2015-12-14 DIAGNOSIS — Z9119 Patient's noncompliance with other medical treatment and regimen: Secondary | ICD-10-CM | POA: Diagnosis not present

## 2015-12-14 DIAGNOSIS — J45901 Unspecified asthma with (acute) exacerbation: Secondary | ICD-10-CM

## 2015-12-14 DIAGNOSIS — J189 Pneumonia, unspecified organism: Secondary | ICD-10-CM | POA: Diagnosis present

## 2015-12-14 DIAGNOSIS — E872 Acidosis: Secondary | ICD-10-CM | POA: Diagnosis present

## 2015-12-14 DIAGNOSIS — Z87891 Personal history of nicotine dependence: Secondary | ICD-10-CM | POA: Diagnosis not present

## 2015-12-14 DIAGNOSIS — Z886 Allergy status to analgesic agent status: Secondary | ICD-10-CM | POA: Diagnosis not present

## 2015-12-14 DIAGNOSIS — G4733 Obstructive sleep apnea (adult) (pediatric): Secondary | ICD-10-CM | POA: Diagnosis present

## 2015-12-14 DIAGNOSIS — N179 Acute kidney failure, unspecified: Secondary | ICD-10-CM | POA: Diagnosis present

## 2015-12-14 DIAGNOSIS — Z9981 Dependence on supplemental oxygen: Secondary | ICD-10-CM | POA: Diagnosis not present

## 2015-12-14 DIAGNOSIS — R079 Chest pain, unspecified: Secondary | ICD-10-CM | POA: Diagnosis present

## 2015-12-14 DIAGNOSIS — K219 Gastro-esophageal reflux disease without esophagitis: Secondary | ICD-10-CM | POA: Diagnosis present

## 2015-12-14 DIAGNOSIS — J9621 Acute and chronic respiratory failure with hypoxia: Secondary | ICD-10-CM | POA: Diagnosis not present

## 2015-12-14 DIAGNOSIS — Z993 Dependence on wheelchair: Secondary | ICD-10-CM | POA: Diagnosis not present

## 2015-12-14 DIAGNOSIS — Q828 Other specified congenital malformations of skin: Secondary | ICD-10-CM | POA: Diagnosis not present

## 2015-12-14 DIAGNOSIS — E785 Hyperlipidemia, unspecified: Secondary | ICD-10-CM

## 2015-12-14 DIAGNOSIS — N183 Chronic kidney disease, stage 3 (moderate): Secondary | ICD-10-CM | POA: Diagnosis not present

## 2015-12-14 DIAGNOSIS — F329 Major depressive disorder, single episode, unspecified: Secondary | ICD-10-CM | POA: Diagnosis present

## 2015-12-14 HISTORY — DX: Hyperlipidemia, unspecified: E78.5

## 2015-12-14 LAB — RESPIRATORY PANEL BY PCR
ADENOVIRUS-RVPPCR: NOT DETECTED
Bordetella pertussis: NOT DETECTED
CORONAVIRUS 229E-RVPPCR: NOT DETECTED
CORONAVIRUS NL63-RVPPCR: NOT DETECTED
CORONAVIRUS OC43-RVPPCR: NOT DETECTED
Chlamydophila pneumoniae: NOT DETECTED
Coronavirus HKU1: NOT DETECTED
INFLUENZA B-RVPPCR: NOT DETECTED
Influenza A: NOT DETECTED
MYCOPLASMA PNEUMONIAE-RVPPCR: NOT DETECTED
Metapneumovirus: NOT DETECTED
PARAINFLUENZA VIRUS 1-RVPPCR: NOT DETECTED
Parainfluenza Virus 2: NOT DETECTED
Parainfluenza Virus 3: NOT DETECTED
Parainfluenza Virus 4: NOT DETECTED
RESPIRATORY SYNCYTIAL VIRUS-RVPPCR: NOT DETECTED
Rhinovirus / Enterovirus: NOT DETECTED

## 2015-12-14 LAB — APTT: aPTT: 35 seconds (ref 24–36)

## 2015-12-14 LAB — HEPARIN LEVEL (UNFRACTIONATED): Heparin Unfractionated: 0.29 IU/mL — ABNORMAL LOW (ref 0.30–0.70)

## 2015-12-14 LAB — LIPID PANEL
CHOLESTEROL: 186 mg/dL (ref 0–200)
HDL: 38 mg/dL — ABNORMAL LOW (ref >40)
LDL Cholesterol: 122 mg/dL — ABNORMAL HIGH (ref 0–99)
TRIGLYCERIDES: 129 mg/dL (ref <150)
Total CHOL/HDL Ratio: 4.9 RATIO
VLDL: 26 mg/dL (ref 0–40)

## 2015-12-14 LAB — MRSA PCR SCREENING: MRSA BY PCR: POSITIVE — AB

## 2015-12-14 LAB — TROPONIN I
Troponin I: <0.03 ng/mL (ref <0.03)
Troponin I: <0.03 ng/mL (ref <0.03)

## 2015-12-14 LAB — BRAIN NATRIURETIC PEPTIDE: B Natriuretic Peptide: 45.7 pg/mL (ref 0.0–100.0)

## 2015-12-14 LAB — PROTIME-INR
INR: 0.98
Prothrombin Time: 13 seconds (ref 11.4–15.2)

## 2015-12-14 LAB — D-DIMER, QUANTITATIVE: D-Dimer, Quant: 0.54 ug/mL-FEU — ABNORMAL HIGH (ref 0.00–0.50)

## 2015-12-14 LAB — ECHOCARDIOGRAM COMPLETE
Height: 65 in
Weight: 7125.27 oz

## 2015-12-14 MED ORDER — DARIFENACIN HYDROBROMIDE ER 7.5 MG PO TB24
7.5000 mg | ORAL_TABLET | Freq: Every day | ORAL | Status: DC
  Administered 2015-12-14 – 2015-12-24 (×11): 7.5 mg via ORAL
  Filled 2015-12-14 (×11): qty 1

## 2015-12-14 MED ORDER — MORPHINE SULFATE (PF) 2 MG/ML IV SOLN
2.0000 mg | INTRAVENOUS | Status: DC | PRN
  Administered 2015-12-14: 2 mg via INTRAVENOUS
  Filled 2015-12-14: qty 1

## 2015-12-14 MED ORDER — DM-GUAIFENESIN ER 30-600 MG PO TB12
1.0000 | ORAL_TABLET | Freq: Two times a day (BID) | ORAL | Status: DC
Start: 2015-12-14 — End: 2015-12-24
  Administered 2015-12-14 – 2015-12-24 (×22): 1 via ORAL
  Filled 2015-12-14 (×23): qty 1

## 2015-12-14 MED ORDER — NITROGLYCERIN 0.4 MG SL SUBL
0.4000 mg | SUBLINGUAL_TABLET | SUBLINGUAL | Status: DC | PRN
Start: 2015-12-14 — End: 2015-12-24

## 2015-12-14 MED ORDER — MORPHINE SULFATE (PF) 4 MG/ML IV SOLN
8.0000 mg | Freq: Once | INTRAVENOUS | Status: AC
  Administered 2015-12-14: 8 mg via INTRAVENOUS
  Filled 2015-12-14: qty 2

## 2015-12-14 MED ORDER — TOPIRAMATE 100 MG PO TABS
100.0000 mg | ORAL_TABLET | Freq: Two times a day (BID) | ORAL | Status: DC
  Administered 2015-12-14 – 2015-12-24 (×21): 100 mg via ORAL
  Filled 2015-12-14 (×23): qty 1

## 2015-12-14 MED ORDER — METHOCARBAMOL 500 MG PO TABS
500.0000 mg | ORAL_TABLET | Freq: Three times a day (TID) | ORAL | Status: DC | PRN
  Administered 2015-12-14 – 2015-12-19 (×2): 500 mg via ORAL
  Filled 2015-12-14 (×2): qty 1

## 2015-12-14 MED ORDER — HEPARIN SODIUM (PORCINE) 5000 UNIT/ML IJ SOLN: 5000.0000 [IU] | Freq: Three times a day (TID) | INTRAMUSCULAR | Status: DC

## 2015-12-14 MED ORDER — METHYLPREDNISOLONE SODIUM SUCC 125 MG IJ SOLR
80.0000 mg | Freq: Two times a day (BID) | INTRAMUSCULAR | Status: DC
  Administered 2015-12-14 – 2015-12-15 (×3): 80 mg via INTRAVENOUS
  Filled 2015-12-14 (×3): qty 2

## 2015-12-14 MED ORDER — ESCITALOPRAM OXALATE 10 MG PO TABS
10.0000 mg | ORAL_TABLET | Freq: Every day | ORAL | Status: DC
  Administered 2015-12-14 – 2015-12-24 (×11): 10 mg via ORAL
  Filled 2015-12-14 (×11): qty 1

## 2015-12-14 MED ORDER — ALPRAZOLAM 1 MG PO TABS
1.0000 mg | ORAL_TABLET | Freq: Every evening | ORAL | Status: DC | PRN
Start: 2015-12-14 — End: 2015-12-14

## 2015-12-14 MED ORDER — ALLOPURINOL 100 MG PO TABS
100.0000 mg | ORAL_TABLET | Freq: Every morning | ORAL | Status: DC
  Administered 2015-12-14 – 2015-12-24 (×11): 100 mg via ORAL
  Filled 2015-12-14 (×11): qty 1

## 2015-12-14 MED ORDER — FUROSEMIDE 10 MG/ML IJ SOLN
40.0000 mg | Freq: Every day | INTRAMUSCULAR | Status: DC
  Administered 2015-12-14 – 2015-12-16 (×3): 40 mg via INTRAVENOUS
  Filled 2015-12-14 (×3): qty 4

## 2015-12-14 MED ORDER — HYDROXYZINE HCL 25 MG PO TABS
25.0000 mg | ORAL_TABLET | Freq: Three times a day (TID) | ORAL | Status: DC | PRN
  Administered 2015-12-17 – 2015-12-23 (×4): 25 mg via ORAL
  Filled 2015-12-14 (×4): qty 1

## 2015-12-14 MED ORDER — ATORVASTATIN CALCIUM 10 MG PO TABS
20.0000 mg | ORAL_TABLET | Freq: Every morning | ORAL | Status: DC
  Administered 2015-12-14 – 2015-12-23 (×10): 20 mg via ORAL
  Filled 2015-12-14 (×10): qty 2

## 2015-12-14 MED ORDER — PERFLUTREN LIPID MICROSPHERE
1.0000 mL | INTRAVENOUS | Status: AC | PRN
  Administered 2015-12-14: 4 mL via INTRAVENOUS
  Filled 2015-12-14: qty 10

## 2015-12-14 MED ORDER — ACETAMINOPHEN 325 MG PO TABS
650.0000 mg | ORAL_TABLET | ORAL | Status: DC | PRN
  Filled 2015-12-14: qty 2

## 2015-12-14 MED ORDER — CHLORHEXIDINE GLUCONATE CLOTH 2 % EX PADS
6.0000 | MEDICATED_PAD | Freq: Every day | CUTANEOUS | Status: AC
  Administered 2015-12-15 – 2015-12-19 (×4): 6 via TOPICAL

## 2015-12-14 MED ORDER — PANTOPRAZOLE SODIUM 40 MG PO TBEC
40.0000 mg | DELAYED_RELEASE_TABLET | Freq: Every morning | ORAL | Status: DC
  Administered 2015-12-14 – 2015-12-24 (×11): 40 mg via ORAL
  Filled 2015-12-14 (×11): qty 1

## 2015-12-14 MED ORDER — IPRATROPIUM-ALBUTEROL 0.5-2.5 (3) MG/3ML IN SOLN
3.0000 mL | RESPIRATORY_TRACT | Status: DC
  Administered 2015-12-14: 3 mL via RESPIRATORY_TRACT
  Filled 2015-12-14: qty 3

## 2015-12-14 MED ORDER — ALBUTEROL SULFATE (2.5 MG/3ML) 0.083% IN NEBU: 2.5000 mg | INHALATION_SOLUTION | RESPIRATORY_TRACT | Status: DC | PRN

## 2015-12-14 MED ORDER — INDOMETHACIN ER 75 MG PO CPCR
75.0000 mg | ORAL_CAPSULE | Freq: Two times a day (BID) | ORAL | Status: DC
  Administered 2015-12-14 – 2015-12-24 (×21): 75 mg via ORAL
  Filled 2015-12-14 (×26): qty 1

## 2015-12-14 MED ORDER — FOLIC ACID 1 MG PO TABS
1.0000 mg | ORAL_TABLET | Freq: Every day | ORAL | Status: DC
  Administered 2015-12-14 – 2015-12-24 (×11): 1 mg via ORAL
  Filled 2015-12-14 (×11): qty 1

## 2015-12-14 MED ORDER — IPRATROPIUM-ALBUTEROL 0.5-2.5 (3) MG/3ML IN SOLN
3.0000 mL | Freq: Four times a day (QID) | RESPIRATORY_TRACT | Status: DC
  Administered 2015-12-14 – 2015-12-15 (×4): 3 mL via RESPIRATORY_TRACT
  Filled 2015-12-14 (×4): qty 3

## 2015-12-14 MED ORDER — HEPARIN SODIUM (PORCINE) 5000 UNIT/ML IJ SOLN
5000.0000 [IU] | Freq: Three times a day (TID) | INTRAMUSCULAR | Status: DC
  Administered 2015-12-14 – 2015-12-21 (×19): 5000 [IU] via SUBCUTANEOUS
  Filled 2015-12-14 (×22): qty 1

## 2015-12-14 MED ORDER — ISOSORBIDE MONONITRATE ER 30 MG PO TB24
30.0000 mg | ORAL_TABLET | Freq: Every day | ORAL | Status: DC
  Administered 2015-12-14 – 2015-12-24 (×11): 30 mg via ORAL
  Filled 2015-12-14 (×11): qty 1

## 2015-12-14 MED ORDER — BUTALBITAL-APAP-CAFFEINE 50-325-40 MG PO TABS
1.0000 | ORAL_TABLET | Freq: Four times a day (QID) | ORAL | Status: DC | PRN
  Administered 2015-12-14 – 2015-12-16 (×5): 1 via ORAL
  Filled 2015-12-14 (×6): qty 1

## 2015-12-14 MED ORDER — GABAPENTIN 100 MG PO CAPS
100.0000 mg | ORAL_CAPSULE | Freq: Two times a day (BID) | ORAL | Status: DC
  Administered 2015-12-14 – 2015-12-24 (×19): 100 mg via ORAL
  Filled 2015-12-14 (×21): qty 1

## 2015-12-14 MED ORDER — LISINOPRIL 10 MG PO TABS
40.0000 mg | ORAL_TABLET | Freq: Every day | ORAL | Status: DC
  Administered 2015-12-15: 40 mg via ORAL
  Filled 2015-12-14: qty 4

## 2015-12-14 MED ORDER — MUPIROCIN 2 % EX OINT
1.0000 "application " | TOPICAL_OINTMENT | Freq: Two times a day (BID) | CUTANEOUS | Status: AC
  Administered 2015-12-14 – 2015-12-18 (×10): 1 via NASAL
  Filled 2015-12-14: qty 22

## 2015-12-14 MED ORDER — HYDROCODONE-ACETAMINOPHEN 10-325 MG PO TABS
1.0000 | ORAL_TABLET | ORAL | Status: DC | PRN
  Administered 2015-12-14 – 2015-12-24 (×30): 1 via ORAL
  Filled 2015-12-14 (×30): qty 1

## 2015-12-14 MED ORDER — OMEGA-3-ACID ETHYL ESTERS 1 G PO CAPS
1.0000 g | ORAL_CAPSULE | Freq: Every day | ORAL | Status: DC
  Administered 2015-12-14 – 2015-12-24 (×11): 1 g via ORAL
  Filled 2015-12-14 (×11): qty 1

## 2015-12-14 MED ORDER — VITAMIN E 45 MG (100 UNIT) PO CAPS
100.0000 [IU] | ORAL_CAPSULE | Freq: Every day | ORAL | Status: DC
  Administered 2015-12-14 – 2015-12-24 (×11): 100 [IU] via ORAL
  Filled 2015-12-14 (×11): qty 1

## 2015-12-14 MED ORDER — ALPRAZOLAM 1 MG PO TABS
1.0000 mg | ORAL_TABLET | Freq: Three times a day (TID) | ORAL | Status: DC | PRN
  Administered 2015-12-16 – 2015-12-24 (×12): 1 mg via ORAL
  Filled 2015-12-14 (×12): qty 1

## 2015-12-14 MED ORDER — TECHNETIUM TO 99M ALBUMIN AGGREGATED
4.1700 | Freq: Once | INTRAVENOUS | Status: AC | PRN
  Administered 2015-12-14: 4.17 via INTRAVENOUS

## 2015-12-14 MED ORDER — CLONIDINE HCL 0.1 MG PO TABS
0.2000 mg | ORAL_TABLET | Freq: Two times a day (BID) | ORAL | Status: DC
  Administered 2015-12-14 – 2015-12-15 (×4): 0.2 mg via ORAL
  Filled 2015-12-14 (×4): qty 2

## 2015-12-14 MED ORDER — ONDANSETRON HCL 4 MG/2ML IJ SOLN
4.0000 mg | Freq: Four times a day (QID) | INTRAMUSCULAR | Status: DC | PRN
  Filled 2015-12-14 (×2): qty 2

## 2015-12-14 MED ORDER — ZOLPIDEM TARTRATE 5 MG PO TABS
5.0000 mg | ORAL_TABLET | Freq: Every evening | ORAL | Status: DC | PRN
  Filled 2015-12-14: qty 1

## 2015-12-14 MED ORDER — AMMONIUM LACTATE 12 % EX LOTN: 1.0000 "application " | TOPICAL_LOTION | Freq: Every day | CUTANEOUS | Status: DC | PRN

## 2015-12-14 MED ORDER — HEPARIN (PORCINE) IN NACL 100-0.45 UNIT/ML-% IJ SOLN
1700.0000 [IU]/h | INTRAMUSCULAR | Status: DC
  Administered 2015-12-14: 1700 [IU]/h via INTRAVENOUS
  Filled 2015-12-14 (×2): qty 250

## 2015-12-14 MED ORDER — ASPIRIN 325 MG PO TABS
325.0000 mg | ORAL_TABLET | Freq: Every morning | ORAL | Status: DC
  Administered 2015-12-14 – 2015-12-24 (×11): 325 mg via ORAL
  Filled 2015-12-14 (×11): qty 1

## 2015-12-14 MED ORDER — HEPARIN BOLUS VIA INFUSION
5000.0000 [IU] | Freq: Once | INTRAVENOUS | Status: AC
  Administered 2015-12-14: 5000 [IU] via INTRAVENOUS
  Filled 2015-12-14: qty 5000

## 2015-12-14 MED ORDER — TECHNETIUM TC 99M DIETHYLENETRIAME-PENTAACETIC ACID: 29.7000 | Freq: Once | INTRAVENOUS | Status: DC | PRN

## 2015-12-14 MED ORDER — LUBRIDERM SERIOUSLY SENSITIVE EX LOTN
1.0000 "application " | TOPICAL_LOTION | Freq: Three times a day (TID) | CUTANEOUS | Status: DC
  Administered 2015-12-14 – 2015-12-24 (×30): 1 via TOPICAL
  Filled 2015-12-14: qty 473

## 2015-12-14 MED ORDER — PERFLUTREN LIPID MICROSPHERE
INTRAVENOUS | Status: AC
  Filled 2015-12-14: qty 10

## 2015-12-14 MED ORDER — FUROSEMIDE 10 MG/ML IJ SOLN
80.0000 mg | Freq: Once | INTRAMUSCULAR | Status: AC
  Administered 2015-12-14: 80 mg via INTRAVENOUS
  Filled 2015-12-14: qty 8

## 2015-12-14 NOTE — Progress Notes (Signed)
ANTICOAGULATION CONSULT NOTE - Initial Consult  Pharmacy Consult for IV heparin Indication: R/o PE  Allergies  Allergen Reactions  . Ibuprofen Hives, Shortness Of Breath and Palpitations    Has tolerated toradol 12/2013 inpatient  . Penicillins Anaphylaxis and Hives    Has patient had a PCN reaction causing immediate rash, facial/tongue/throat swelling, SOB or lightheadedness with hypotension: Yes Has patient had a PCN reaction causing severe rash involving mucus membranes or skin necrosis: Yes Has patient had a PCN reaction that required hospitalization Unknown Has patient had a PCN reaction occurring within the last 10 years: Unknown If all of the above answers are "NO", then may proceed with Cephalosporin use.   . Sulfa Antibiotics Hives, Shortness Of Breath and Cough  . Doxycycline Hives and Swelling  . Ibuprofen Hives and Swelling  . Zithromax [Azithromycin] Hives and Swelling  . Adhesive [Tape] Rash  . Adhesive [Tape] Rash  . Azithromycin Hives and Rash  . Doxycycline Rash  . Penicillins Rash    Has patient had a PCN reaction causing immediate rash, facial/tongue/throat swelling, SOB or lightheadedness with hypotension:Yes Has patient had a PCN reaction causing severe rash involving mucus membranes or skin necrosis:Yes Has patient had a PCN reaction that required hospitalization Yes Has patient had a PCN reaction occurring within the last 10 years:No If all of the above answers are "NO", then may proceed with Cephalosporin use.   . Sulfa Antibiotics Rash  . Sulfamethoxazole-Trimethoprim Rash    Patient Measurements: Height: 5\' 5"  (165.1 cm) Weight: (!) 445 lb 5.3 oz (202 kg) IBW/kg (Calculated) : 57 Heparin Dosing Weight: 100 kg  Vital Signs: Temp: 97.9 F (36.6 C) (09/22 0559) Temp Source: Oral (09/22 0559) BP: 100/78 (09/22 0559) Pulse Rate: 69 (09/22 0500)  Labs:  Recent Labs  12/13/15 2120 12/14/15 0303  HGB 13.2  --   HCT 41.9  --   PLT 239  --    LABPROT  --  13.0  INR  --  0.98  CREATININE 1.26*  --   TROPONINI  --  <0.03    Estimated Creatinine Clearance: 95.9 mL/min (by C-G formula based on SCr of 1.26 mg/dL (H)).   Medical History: Past Medical History:  Diagnosis Date  . Allergy   . Anxiety   . Arthritis   . Asthma   . Asthma   . CHF (congestive heart failure) ([place])   . CHF (congestive heart failure) ([place])   . Chronic abdominal pain   . Chronic kidney disease   . Chronic pain    "all over"  . 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 GERD (gastroesophageal reflux disease)   . Gout   . Hidradenitis   . HLD (hyperlipidemia) 12/14/2015  . Homelessness   . Hyperlipemia   . Hypertension   . Low back pain   . Morbid obesity ([place])    uses motor wheel chair  . MRSA (methicillin resistant Staphylococcus aureus)    states about a year ago  . On home O2    3L N/C O2 continuously  . OSA (obstructive sleep apnea)    non-compliant with CPAP  . Oxygen deficiency   . Renal insufficiency   . Sleep apnea     Medications:  Scheduled:  . allopurinol  100 mg Oral q morning - 10a  . aspirin  325 mg Oral q morning - 10a  . atorvastatin  20 mg Oral q morning - 10a  . cloNIDine  0.2 mg  Oral BID  . darifenacin  7.5 mg Oral Daily  . dextromethorphan-guaiFENesin  1 tablet Oral BID  . escitalopram  10 mg Oral Daily  . folic acid  1 mg Oral Daily  . furosemide  40 mg Intravenous Daily  . gabapentin  100 mg Oral BID  . heparin  5,000 Units Subcutaneous Q8H  . indomethacin  75 mg Oral BID WC  . ipratropium-albuterol  3 mL Nebulization QID  . isosorbide mononitrate  30 mg Oral Daily  . lisinopril  40 mg Oral Daily  . lubriderm seriously sensitive  1 application Topical TID  . methylPREDNISolone (SOLU-MEDROL) injection  80 mg Intravenous BID  . omega-3 acid ethyl esters  1 g Oral Daily  . pantoprazole  40 mg Oral q morning - 10a  . topiramate  100 mg Oral BID  . vitamin E  100 Units Oral Daily   Infusions:     Assessment: 83 yoF c/o SOB and CP.  IV heparin per Rx for possible PE.  Goal of Therapy:  Heparin level 0.3-0.7 units/ml Monitor platelets by anticoagulation protocol: Yes   Plan:   Add baseline aPtt to blood already drawn  D/c sq heparin  Heparin 5000 unit bolus x 1  Start drip @1700  units/hr (17 ml/hr)  Daily CBC/HL  Check 1st HL 6 hours after drip started  Dorrene German 12/14/2015,6:33 AM

## 2015-12-14 NOTE — ED Provider Notes (Signed)
[facility] DEPT Provider Note   CSN: [id number] Arrival date & time: 12/13/15  2056  By signing my name below, I, Kara Vincent, attest that this documentation has been prepared under the direction and in the presence of [name], MD . Electronically Signed: Higinio Vincent, Scribe. 12/14/2015. 12:16 AM.  History   Chief Complaint Chief Complaint  Patient presents with  . Shortness of Breath  . Chest Pain   The history is provided by the patient. No language interpreter was used.   HPI Comments: Kara Vincent is a 50 y.o. female with PMHx of asthma, CHF, HTN and HLD, who presents to the Emergency Department complaining of gradually worsening, shortness of breath that began 1 week ago and worsened today. She notes productive cough with yellow mucous, chills and bilateral lower leg swelling that also began 1 week ago. She states she uses 3L of oxygen at home. Pt also reports left sided, intermittent, "sharp" chest pain that began 2 days ago. She states her chest pain radiates down her left side and into her left back; she notes it is exacerbated when coughing. She reports hx of CHF and states her symptoms feels similar; she notes she is not currently followed by a cardiologist. She denies recent appetite change and fever. Pt states she is currently takes 40 mg lasix BID and has not missed any doses.   Past Medical History:  Diagnosis Date  . Allergy   . Anxiety   . Arthritis   . Asthma   . Asthma   . CHF (congestive heart failure) ([place])   . CHF (congestive heart failure) ([place])   . Chronic abdominal pain   . Chronic kidney disease   . Chronic pain    "all over"  . 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 GERD (gastroesophageal reflux disease)   . Gout   . Hidradenitis   . Homelessness   . Hyperlipemia   . Hypertension   . Low back pain   . Morbid obesity ([place])    uses motor wheel chair  . MRSA (methicillin resistant Staphylococcus aureus)    states about a  year ago  . On home O2    3L N/C O2 continuously  . OSA (obstructive sleep apnea)    non-compliant with CPAP  . Oxygen deficiency   . Renal insufficiency   . Sleep apnea     Patient Active Problem List   Diagnosis Date Noted  . Acute pulmonary edema ([place]) 12/14/2015  . Shortness of breath 06/09/2015  . Atypical chest pain   . Darier's disease   . Leukocytosis   . 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 Acute bronchitis 11/20/2014  . Right-sided heart failure ([place]) 11/20/2014  . OSA (obstructive sleep apnea)   . CKD (chronic kidney disease) stage 3, GFR 30-59 ml/min 11/17/2014  . Asthma exacerbation 11/16/2014  . Venous stasis 11/16/2014  . Acute on chronic respiratory failure with hypoxia ([place]) 11/16/2014  . Hypotension 08/08/2012  . Chronic pain 08/24/2011  . Morbid obesity ([place]) 08/24/2011    Past Surgical History:  Procedure Laterality Date  . BACK SURGERY    . CESAREAN SECTION    . CESAREAN SECTION    . FRACTURE SURGERY    . TUBAL LIGATION      OB History    Gravida Para Term Preterm AB Living   0 0 0 0 0     SAB TAB Ectopic Multiple Live Births   0 0 0  Home Medications    Prior to Admission medications   Medication Sig Start Date End Date Taking? Authorizing Provider  albuterol (PROVENTIL) (2.5 MG/3ML) 0.083% nebulizer solution Take 2.5 mg by nebulization every 6 (six) hours as needed for wheezing or shortness of breath.   Yes Historical Provider, MD  allopurinol (ZYLOPRIM) 100 MG tablet Take 1 tablet (100 mg total) by mouth every morning. 01/22/14  Yes Donne Hazel, MD  ALPRAZolam Duanne Moron) 1 MG tablet Take 1 mg by mouth 3 (three) times daily as needed for anxiety (anxiety).    Yes Historical Provider, MD  ALPRAZolam Duanne Moron) 1 MG tablet Take 1 mg by mouth at bedtime as needed for anxiety.   Yes Historical Provider, MD  ammonium lactate (AMLACTIN) 12 % cream Apply 1 g topically daily as needed for dry skin. 04/22/14  Yes Linton Flemings, MD  aspirin 325 MG tablet  Take 325 mg by mouth every morning.    Yes Historical Provider, MD  atorvastatin (LIPITOR) 20 MG tablet Take 1 tablet (20 mg total) by mouth every morning. 06/11/15  Yes Lavina Hamman, MD  benzonatate (TESSALON) 100 MG capsule Take 1 capsule (100 mg total) by mouth 3 (three) times daily as needed for cough. 06/11/15  Yes Lavina Hamman, MD  budesonide-formoterol (SYMBICORT) 160-4.5 MCG/ACT inhaler Inhale 2 puffs into the lungs 2 (two) times daily. 11/21/14  Yes Debbe Odea, MD  butalbital-acetaminophen-caffeine (FIORICET, ESGIC) 50-325-40 MG tablet Take 1 tablet by mouth every 6 (six) hours as needed for headache. 06/11/15  Yes Lavina Hamman, MD  cloNIDine (CATAPRES) 0.2 MG tablet Take 0.2 mg by mouth 2 (two) times daily.   Yes Historical Provider, MD  dextromethorphan-guaiFENesin (MUCINEX DM) 30-600 MG 12hr tablet Take 1 tablet by mouth 2 (two) times daily. 06/11/15  Yes Lavina Hamman, MD  diclofenac (VOLTAREN) 75 MG EC tablet Take 75 mg by mouth 2 (two) times daily.   Yes Historical Provider, MD  Emollient (EUCERIN) lotion Apply 5 mLs topically 2 (two) times daily as needed for dry skin. Patient taking differently: Apply 5 mLs topically 3 (three) times daily.  04/22/14  Yes Linton Flemings, MD  escitalopram (LEXAPRO) 10 MG tablet Take 10 mg by mouth daily.   Yes Historical Provider, MD  folic acid (FOLVITE) 1 MG tablet Take 1 tablet (1 mg total) by mouth daily. 01/22/14  Yes Donne Hazel, MD  furosemide (LASIX) 20 MG tablet Take 2 tablets (40 mg total) by mouth daily as needed for fluid or edema (WEIGHT GAIN OF MORE THEN 3 LBS IN A DAY). 06/11/15  Yes Lavina Hamman, MD  gabapentin (NEURONTIN) 100 MG capsule Take 1 capsule (100 mg total) by mouth 2 (two) times daily. 06/11/15  Yes Lavina Hamman, MD  HYDROcodone-acetaminophen (NORCO) 10-325 MG tablet Take 1 tablet by mouth every 4 (four) hours as needed for moderate pain.  09/13/15  Yes Historical Provider, MD  hydrOXYzine (ATARAX/VISTARIL) 25 MG tablet Take  25 mg by mouth 3 (three) times daily as needed for anxiety or itching (anxiety and itching).    Yes Historical Provider, MD  indomethacin (INDOCIN SR) 75 MG CR capsule Take 75 mg by mouth 2 (two) times daily with a meal.   Yes Historical Provider, MD  isosorbide mononitrate (IMDUR) 30 MG 24 hr tablet Take 30 mg by mouth daily. 07/27/15  Yes Historical Provider, MD  lisinopril (PRINIVIL,ZESTRIL) 40 MG tablet Take 40 mg by mouth daily.   Yes Historical Provider, MD  methocarbamol (  ROBAXIN) 500 MG tablet Take 1 tablet (500 mg total) by mouth every 8 (eight) hours as needed for muscle spasms. 06/11/15  Yes Lavina Hamman, MD  mometasone-formoterol (DULERA) 100-5 MCG/ACT AERO Inhale 2 puffs into the lungs 2 (two) times daily. 06/11/15  Yes Lavina Hamman, MD  pantoprazole (PROTONIX) 40 MG tablet Take 1 tablet (40 mg total) by mouth every morning. 06/11/15  Yes Lavina Hamman, MD  naproxen (NAPROSYN) 500 MG tablet Take 500 mg by mouth 2 (two) times daily with a meal.    Historical Provider, MD  Omega-3 Fatty Acids (FISH OIL PO) Take 1 capsule by mouth 2 (two) times daily.    Historical Provider, MD  predniSONE (DELTASONE) 10 MG tablet Take 40mg  daily for 3days,Take 30mg  daily for 3days,Take 20mg  daily for 3days,Take 10mg  daily for 3days, then stop. Patient not taking: Reported on 12/14/2015 06/11/15   Lavina Hamman, MD  solifenacin (VESICARE) 5 MG tablet Take 5 mg by mouth every morning.     Historical Provider, MD  solifenacin (VESICARE) 5 MG tablet Take 5 mg by mouth daily.    Historical Provider, MD  topiramate (TOPAMAX) 100 MG tablet Take 100 mg by mouth 2 (two) times daily.    Historical Provider, MD  topiramate (TOPAMAX) 100 MG tablet Take 100 mg by mouth at bedtime.    Historical Provider, MD  VITAMIN E PO Take 1 capsule by mouth daily.    Historical Provider, MD    Family History Family History  Problem Relation Age of Onset  . Asthma Mother   . Diabetes Father   . Stroke Father   . Heart disease  Father   . Arthritis Sister   . Asthma Sister   . Asthma Daughter   . Asthma Son   . Arthritis Sister   . Asthma Sister     Social History Social History  Substance Use Topics  . Smoking status: Former Smoker    Types: Cigarettes    Quit date: 03/25/2003  . Smokeless tobacco: Former Systems developer    Quit date: 05/27/1978  . Alcohol use 0.0 oz/week     Comment: years ago  no longer beer only  early 20's     Allergies   Ibuprofen; Penicillins; Sulfa antibiotics; Doxycycline; Ibuprofen; Zithromax [azithromycin]; Adhesive [tape]; Adhesive [tape]; Azithromycin; Doxycycline; Penicillins; Sulfa antibiotics; and Sulfamethoxazole-trimethoprim   Review of Systems Review of Systems  Constitutional: Positive for chills. Negative for appetite change and fever.  Respiratory: Positive for cough and shortness of breath.   Cardiovascular: Positive for chest pain and leg swelling.  Musculoskeletal: Positive for back pain.   Physical Exam Updated Vital Signs Pulse 71   Temp 98.8 F (37.1 C) (Oral)   Resp 18   Ht 5\' 5"  (1.651 m)   Wt (!) 403 lb (182.8 kg)   SpO2 91%   BMI 67.06 kg/m   Physical Exam  Constitutional: She is oriented to person, place, and time. She appears well-developed and well-nourished.  Morbidly obese  HENT:  Head: Normocephalic and atraumatic.  Right Ear: External ear normal.  Left Ear: External ear normal.  Nose: Nose normal.  Eyes: Right eye exhibits no discharge. Left eye exhibits no discharge.  Cardiovascular: Normal rate, regular rhythm and normal heart sounds.   Pulmonary/Chest: Effort normal. She exhibits tenderness (middle and left lateral chest).  Decreased breath sounds at bases  Abdominal: Soft. There is no tenderness.  Musculoskeletal: She exhibits edema (mild edema at feet and ankles, difficult exam due  to obesity ).  Neurological: She is alert and oriented to person, place, and time.  Skin: Skin is warm and dry.  Nursing note and vitals reviewed.  ED  Treatments / Results  Labs (all labs ordered are listed, but only abnormal results are displayed) Labs Reviewed  BASIC METABOLIC PANEL - Abnormal; Notable for the following:       Result Value   Glucose, Bld 120 (*)    BUN 27 (*)    Creatinine, Ser 1.26 (*)    Calcium 8.8 (*)    GFR calc non Af Amer 48 (*)    GFR calc Af Amer 56 (*)    All other components within normal limits  CBC - Abnormal; Notable for the following:    WBC 10.6 (*)    All other components within normal limits  BRAIN NATRIURETIC PEPTIDE  I-STAT TROPOININ, ED    EKG  EKG Interpretation  Date/Time:  Thursday December 13 2015 21:11:18 EDT Ventricular Rate:  65 PR Interval:    QRS Duration: 105 QT Interval:  410 QTC Calculation: 427 R Axis:   66 Text Interpretation:  Sinus rhythm Atrial premature complexes Low voltage, precordial leads similar to Mar 2017 Confirmed by Regenia Skeeter MD, [place] [phone]) on 12/13/2015 11:50:39 PM       Radiology Dg Chest 2 View  Result Date: 12/13/2015 CLINICAL DATA:  Acute onset of shortness of breath and bilateral leg swelling. Wheezing. Initial encounter. EXAM: CHEST  2 VIEW COMPARISON:  None. FINDINGS: The lungs are well-aerated. Vascular congestion is noted. A small left pleural effusion is seen. Increased interstitial markings raise concern for pulmonary edema. There is no evidence of pneumothorax. The heart is enlarged.  No acute osseous abnormalities are seen. IMPRESSION: Vascular congestion and cardiomegaly. Small left pleural effusion seen. Increased interstitial markings raise concern for pulmonary edema. Electronically Signed   By: Garald Balding M.D.   On: 12/13/2015 22:12    Procedures Procedures (including critical care time)  Medications Ordered in ED Medications  furosemide (LASIX) injection 80 mg (80 mg Intravenous Given 12/14/15 0049)  morphine 4 MG/ML injection 8 mg (8 mg Intravenous Given 12/14/15 0049)    DIAGNOSTIC STUDIES:  Oxygen Saturation is 91% on  RA, low by my interpretation.    COORDINATION OF CARE:  12:11 AM Discussed treatment Vincent with pt at bedside and pt agreed to Vincent.  Initial Impression / Assessment and Vincent / ED Course  I have reviewed the triage vital signs and the nursing notes.  Pertinent labs & imaging results that were available during my care of the patient were reviewed by me and considered in my medical decision making (see chart for details).  Clinical Course  Comment By Time  Given CXR and overall complaints, likely suffering from fluid overload. Will add on BNP, give IV lasix, and IV pain meds. [name], MD 09/22 0015  BNP normal, could be falsely negative due to obesity. Lasix, admit to tele obs for diuresis.Dr. Blaine Hamper to admit [name], MD 09/22 0144   No acute distress. Chest pain is probably from the pleural effusion. I doubt this is PE as she has had this before. Lasix, admit to telemetry obs  I personally performed the services described in this documentation, which was scribed in my presence. The recorded information has been reviewed and is accurate.   Final Clinical Impressions(s) / ED Diagnoses   Final diagnoses:  Acute pulmonary edema (HCC)    New Prescriptions New Prescriptions   No medications  on file     [name], MD 12/14/15 [phone]

## 2015-12-14 NOTE — Progress Notes (Signed)
**  Preliminary report by tech**  Bilateral lower extremity venous duplex completed. Technically difficult study due to patient body habitus, depth of vessels, and acoustic shadowing. There is no evidence of deep or superficial vein thrombosis involving the right and left lower extremities. All visualized vessels appear patent and compressible. There is no evidence of Baker's cysts bilaterally. Results given to the patient's nurse, Zoe.  12/14/15 11:51 AM Kara Vincent RVT

## 2015-12-14 NOTE — Progress Notes (Signed)
OT Cancellation Note  Patient Details Name: JAZIAH KWASNIK MRN: [id number] DOB: 14-Aug-1965   Cancelled Treatment:    Reason Eval/Treat Not Completed: Medical issues which prohibited therapy.  Awaiting VQ scan to r/o PE  Malisa Ruggiero 12/14/2015, 10:35 AM  Lesle Chris, OTR/L [phone] 12/14/2015

## 2015-12-14 NOTE — Progress Notes (Signed)
PROGRESS NOTE    Kara Vincent  WUJ:[id number] DOB: 1965/10/09 DOA: 12/13/2015 PCP: No PCP Per Patient   Chief Complaint  Patient presents with  . Shortness of Breath  . Chest Pain    Brief Narrative:  HPI on 12/14/2015 by Dr. Lorretta Harp Kara Vincent is a 50 y.o. female with medical history significant of hypertension, hyperlipidemia, asthma, OSA on CPAP, morbid obesity, right heart failure, diastolic congestive heart failure, drug abuse, Darer's disease, wheelchair-bound, CKD-III, GERD, gout, anxiety, who presents with shortness of breath, chest pain, leg edema, cough.  Patient states that she has been having shortness of breath, chest pain, cough for more than one week. Her chest pain is located in left lower chest. It is constant, 8 out of 10 in severity, nonradiating, pleuritic, aggravated by coughing and deep breath. He coughs up yellow colored sputum. No fever or chills. He has bilateral lower leg pain, but it not localized to calf areas. He has bilateral lower leg edema. Denies nausea, vomiting, abdominal pain, diarrhea, symptoms of UTI. She is wheelchair bounded, no unilateral weakness, vision change or hearing loss.  Assessment & Plan   Acute on chronic respiratory failure with hypoxia -Likely multifactorial including CHF vs asthma exacerbation -BNP 45, however chest x-ray does show pulmonary edema and vascular congestion -On exam, patient does have significant wheezing as well as coughing -VQ scan ordered to rule out PE, patient placed on IV heparin empirically  Asthma exacerbation -Continue nebs, Solu-Medrol, Mucinex -Chest x-ray showed vascular congestion with cardiomegaly, small left pleural effusion, possible pulmonary edema -Respiratory viral panel unremarkable  Acute on chronic diastolic heart failure -Echocardiogram: EF 60-65%, grade 1 diastolic dysfunction -Monitor intake and output, daily weights -BNP 45 -Patient was given Lasix 80 mg IV in the emergency  department -Continue Lasix 40 mg IV daily  Essential hypertension -Continue clonidine, lisinopril, IV Lasix  Chronic kidney disease, stage III -Baseline creatinine approximately 1.2-1.3, currently -Continue to monitor BMP  Obstructive sleep apnea -Continue CPAP QHS  GERD -Continue PPI  Gout -Continue allopurinol and indomethacin  Hyperlipidemia -Continue Lipitor  Chest pain -Likely pleuritic -Monitor troponin, so far negative 2 -Echocardiogram as above -Lower extremity doppler: Difficult study due to body habitus, no evidence of deep or superficial vein thrombosis -Continue aspirin, statin, Imdur   Depression/anxiety -Continue Lexapro, Xanax PRN  DVT Prophylaxis  heparin   Code Status: Full  Family Communication: None at bedside  Disposition Plan: Currently in observation, pending VQ study. We'll likely transfer to telemetry unit today.  Consultants None  Procedures  Echocardiogram Lower extremity Doppler  Antibiotics   Anti-infectives    None      Subjective:   Kara Vincent seen and examined today. Continues to complain of some shortness of breath and chest pain with deep inspiration. Denies any abdominal pain, nausea, vomiting, diarrhea, constipation, dizziness or headache.  Objective:   Vitals:   12/14/15 1055 12/14/15 1100 12/14/15 1200 12/14/15 1300  BP: (!) 100/56 95/62  (!) 141/124  Pulse: 62 (!) 49 (!) 52 74  Resp: 14 15 13 16   Temp:   98.3 F (36.8 C)   TempSrc:   Oral   SpO2: 95% 93% 95% 94%  Weight:      Height:        Intake/Output Summary (Last 24 hours) at 12/14/15 1426 Last data filed at 12/14/15 1200  Gross per 24 hour  Intake           189.88 ml  Output  1300 ml  Net         -1110.12 ml   Filed Weights   12/13/15 2113 12/14/15 0559  Weight: (!) 182.8 kg (403 lb) (!) 202 kg (445 lb 5.3 oz)    Exam  General: Well developed, well nourished, NAD, appears stated age  HEENT: NCAT, mucous membranes moist.    Cardiovascular: S1 S2 auscultated, ?1/6SEM, RRR  Respiratory: Clear to auscultation bilaterally with equal chest rise  Abdomen: Soft, Morbidly obese, nontender, nondistended, + bowel sounds  Extremities: warm dry without cyanosis clubbing or edema  Neuro: AAOx3, Nonfocal  Psych: Appropriate   Data Reviewed: I have personally reviewed following labs and imaging studies  CBC:  Recent Labs Lab 12/13/15 2120  WBC 10.6*  HGB 13.2  HCT 41.9  MCV 93.7  PLT 239   Basic Metabolic Panel:  Recent Labs Lab 12/13/15 2120  NA 140  K 4.4  CL 102  CO2 29  GLUCOSE 120*  BUN 27*  CREATININE 1.26*  CALCIUM 8.8*   GFR: Estimated Creatinine Clearance: 95.9 mL/min (by C-G formula based on SCr of 1.26 mg/dL (H)). Liver Function Tests: No results for input(s): AST, ALT, ALKPHOS, BILITOT, PROT, ALBUMIN in the last 168 hours. No results for input(s): LIPASE, AMYLASE in the last 168 hours. No results for input(s): AMMONIA in the last 168 hours. Coagulation Profile:  Recent Labs Lab 12/14/15 0303  INR 0.98   Cardiac Enzymes:  Recent Labs Lab 12/14/15 0303 12/14/15 0757  TROPONINI <0.03 <0.03   BNP (last 3 results) No results for input(s): PROBNP in the last 8760 hours. HbA1C: No results for input(s): HGBA1C in the last 72 hours. CBG: No results for input(s): GLUCAP in the last 168 hours. Lipid Profile:  Recent Labs  12/14/15 0303  CHOL 186  HDL 38*  LDLCALC 122*  TRIG 129  CHOLHDL 4.9   Thyroid Function Tests: No results for input(s): TSH, T4TOTAL, FREET4, T3FREE, THYROIDAB in the last 72 hours. Anemia Panel: No results for input(s): VITAMINB12, FOLATE, FERRITIN, TIBC, IRON, RETICCTPCT in the last 72 hours. Urine analysis:    Component Value Date/Time   COLORURINE YELLOW 12/01/2014 2240   APPEARANCEUR CLEAR 12/01/2014 2240   LABSPEC >1.030 (H) 12/01/2014 2240   PHURINE 5.0 12/01/2014 2240   GLUCOSEU NEGATIVE 12/01/2014 2240   GLUCOSEU NEG mg/dL  78/29/5621 3086   HGBUR NEGATIVE 12/01/2014 2240   HGBUR negative 12/05/2009 1003   BILIRUBINUR NEGATIVE 12/01/2014 2240   BILIRUBINUR small 07/31/2010 1713   KETONESUR NEGATIVE 12/01/2014 2240   PROTEINUR NEGATIVE 12/01/2014 2240   UROBILINOGEN 0.2 12/01/2014 2240   NITRITE NEGATIVE 12/01/2014 2240   LEUKOCYTESUR NEGATIVE 12/01/2014 2240   Sepsis Labs: @LABRCNTIP (procalcitonin:4,lacticidven:4)  ) Recent Results (from the past 240 hour(s))  Respiratory Panel by PCR     Status: None   Collection Time: 12/14/15  6:11 AM  Result Value Ref Range Status   Adenovirus NOT DETECTED NOT DETECTED Final   Coronavirus 229E NOT DETECTED NOT DETECTED Final   Coronavirus HKU1 NOT DETECTED NOT DETECTED Final   Coronavirus NL63 NOT DETECTED NOT DETECTED Final   Coronavirus OC43 NOT DETECTED NOT DETECTED Final   Metapneumovirus NOT DETECTED NOT DETECTED Final   Rhinovirus / Enterovirus NOT DETECTED NOT DETECTED Final   Influenza A NOT DETECTED NOT DETECTED Final   Influenza B NOT DETECTED NOT DETECTED Final   Parainfluenza Virus 1 NOT DETECTED NOT DETECTED Final   Parainfluenza Virus 2 NOT DETECTED NOT DETECTED Final   Parainfluenza Virus 3  NOT DETECTED NOT DETECTED Final   Parainfluenza Virus 4 NOT DETECTED NOT DETECTED Final   Respiratory Syncytial Virus NOT DETECTED NOT DETECTED Final   Bordetella pertussis NOT DETECTED NOT DETECTED Final   Chlamydophila pneumoniae NOT DETECTED NOT DETECTED Final   Mycoplasma pneumoniae NOT DETECTED NOT DETECTED Final    Comment: Performed at [facility]  MRSA PCR Screening     Status: Abnormal   Collection Time: 12/14/15  6:11 AM  Result Value Ref Range Status   MRSA by PCR POSITIVE (A) NEGATIVE Final    Comment:        The GeneXpert MRSA Assay (FDA approved for NASAL specimens only), is one component of a comprehensive MRSA colonization surveillance program. It is not intended to diagnose MRSA infection nor to guide or monitor treatment  for MRSA infections. RESULT CALLED TO, READ BACK BY AND VERIFIED WITH: Shari Heritage, RN AT 11:53 12/14/15 BY Dorris Carnes THOMPSON       Radiology Studies: Dg Chest 2 View  Result Date: 12/13/2015 CLINICAL DATA:  Acute onset of shortness of breath and bilateral leg swelling. Wheezing. Initial encounter. EXAM: CHEST  2 VIEW COMPARISON:  None. FINDINGS: The lungs are well-aerated. Vascular congestion is noted. A small left pleural effusion is seen. Increased interstitial markings raise concern for pulmonary edema. There is no evidence of pneumothorax. The heart is enlarged.  No acute osseous abnormalities are seen. IMPRESSION: Vascular congestion and cardiomegaly. Small left pleural effusion seen. Increased interstitial markings raise concern for pulmonary edema. Electronically Signed   By: Roanna Raider M.D.   On: 12/13/2015 22:12     Scheduled Meds: . allopurinol  100 mg Oral q morning - 10a  . aspirin  325 mg Oral q morning - 10a  . atorvastatin  20 mg Oral q morning - 10a  . [START ON 12/15/2015] Chlorhexidine Gluconate Cloth  6 each Topical Q0600  . cloNIDine  0.2 mg Oral BID  . darifenacin  7.5 mg Oral Daily  . dextromethorphan-guaiFENesin  1 tablet Oral BID  . escitalopram  10 mg Oral Daily  . folic acid  1 mg Oral Daily  . furosemide  40 mg Intravenous Daily  . gabapentin  100 mg Oral BID  . indomethacin  75 mg Oral BID WC  . ipratropium-albuterol  3 mL Nebulization QID  . isosorbide mononitrate  30 mg Oral Daily  . lisinopril  40 mg Oral Daily  . lubriderm seriously sensitive  1 application Topical TID  . methylPREDNISolone (SOLU-MEDROL) injection  80 mg Intravenous BID  . mupirocin ointment  1 application Nasal BID  . omega-3 acid ethyl esters  1 g Oral Daily  . pantoprazole  40 mg Oral q morning - 10a  . topiramate  100 mg Oral BID  . vitamin E  100 Units Oral Daily   Continuous Infusions: . heparin 1,700 Units/hr (12/14/15 1200)     LOS: 0 days   Time Spent in minutes   30  minutes  Talley Kreiser D.O. on 12/14/2015 at 2:26 PM  Between 7am to 7pm - Pager - [phone]  After 7pm go to [email or web address] - password [email or web address]  And look for the night coverage person covering for me after hours  [facility] Office  [phone]

## 2015-12-14 NOTE — ED Notes (Signed)
Delay in patients vital was assisting nurse in another room

## 2015-12-14 NOTE — H&P (Signed)
History and Physical    Kara Vincent VEH:[id number] DOB: 23-Aug-1965 DOA: 12/13/2015  Referring MD/NP/PA:   PCP: Harvie Junior, MD   Patient coming from:  The patient is coming from home.  At baseline, pt is independent for most of ADL.   Chief Complaint: Shortness of breath, chest pain, cough, leg edema  HPI: Kara Vincent is a 50 y.o. female with medical history significant of hypertension, hyperlipidemia, asthma, OSA on CPAP, morbid obesity, right heart failure, diastolic congestive heart failure, drug abuse, Darer's disease, wheelchair-bound, CKD-III, GERD, gout, anxiety, who presents with shortness of breath, chest pain, leg edema, cough.  Patient states that she has been having shortness of breath, chest pain, cough for more than one week. Her chest pain is located in left lower chest. It is constant, 8 out of 10 in severity, nonradiating, pleuritic, aggravated by coughing and deep breath. He coughs up yellow colored sputum. No fever or chills. He has bilateral lower leg pain, but it not localized to calf areas. He has bilateral lower leg edema. Denies nausea, vomiting, abdominal pain, diarrhea, symptoms of UTI. She is wheelchair bounded, no unilateral weakness, vision change or hearing loss.  ED Course: pt was found to have negative troponin, BNP 45.7, WBC 10.6, positive d-dimer 0.54, stable renal function, temperature normal, oxygen desaturation to 88% on room air, chest x-ray showed vascular congestion, small left pleural effusion seen. Increased interstitial markings raise concern for pulmonary edema. Pt is placed on telemetry bed for observation.  Review of Systems:   General: no fevers, chills, no changes in body weight, has poor appetite, has fatigue HEENT: no blurry vision, hearing changes or sore throat Respiratory: has dyspnea, coughing, wheezing CV: has chest pain, no palpitations GI: no nausea, vomiting, abdominal pain, diarrhea, constipation GU: no dysuria,  burning on urination, increased urinary frequency, hematuria  Ext: has leg edema Neuro: no unilateral weakness, numbness, or tingling, no vision change or hearing loss Skin: no rash. MSK: No muscle spasm, no deformity, no limitation of range of movement in spin Heme: No easy bruising.  Travel history: No recent long distant travel.  Allergy:  Allergies  Allergen Reactions  . Ibuprofen Hives, Shortness Of Breath and Palpitations    Has tolerated toradol 12/2013 inpatient  . Penicillins Anaphylaxis and Hives    Has patient had a PCN reaction causing immediate rash, facial/tongue/throat swelling, SOB or lightheadedness with hypotension: Yes Has patient had a PCN reaction causing severe rash involving mucus membranes or skin necrosis: Yes Has patient had a PCN reaction that required hospitalization Unknown Has patient had a PCN reaction occurring within the last 10 years: Unknown If all of the above answers are "NO", then may proceed with Cephalosporin use.   . Sulfa Antibiotics Hives, Shortness Of Breath and Cough  . Doxycycline Hives and Swelling  . Ibuprofen Hives and Swelling  . Zithromax [Azithromycin] Hives and Swelling  . Adhesive [Tape] Rash  . Adhesive [Tape] Rash  . Azithromycin Hives and Rash  . Doxycycline Rash  . Penicillins Rash    Has patient had a PCN reaction causing immediate rash, facial/tongue/throat swelling, SOB or lightheadedness with hypotension:Yes Has patient had a PCN reaction causing severe rash involving mucus membranes or skin necrosis:Yes Has patient had a PCN reaction that required hospitalization Yes Has patient had a PCN reaction occurring within the last 10 years:No If all of the above answers are "NO", then may proceed with Cephalosporin use.   . Sulfa Antibiotics Rash  . Sulfamethoxazole-Trimethoprim  Rash    Past Medical History:  Diagnosis Date  . Allergy   . Anxiety   . Arthritis   . Asthma   . Asthma   . CHF (congestive heart failure)  ([place])   . CHF (congestive heart failure) ([place])   . Chronic abdominal pain   . Chronic kidney disease   . Chronic pain    "all over"  . 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 GERD (gastroesophageal reflux disease)   . Gout   . Hidradenitis   . HLD (hyperlipidemia) 12/14/2015  . Homelessness   . Hyperlipemia   . Hypertension   . Low back pain   . Morbid obesity ([place])    uses motor wheel chair  . MRSA (methicillin resistant Staphylococcus aureus)    states about a year ago  . On home O2    3L N/C O2 continuously  . OSA (obstructive sleep apnea)    non-compliant with CPAP  . Oxygen deficiency   . Renal insufficiency   . Sleep apnea     Past Surgical History:  Procedure Laterality Date  . BACK SURGERY    . CESAREAN SECTION    . CESAREAN SECTION    . FRACTURE SURGERY    . TUBAL LIGATION      Social History:  reports that she quit smoking about 12 years ago. Her smoking use included Cigarettes. She quit smokeless tobacco use about 37 years ago. She reports that she drinks alcohol. She reports that she uses drugs, including "Crack" cocaine and Other-see comments.  Family History:  Family History  Problem Relation Age of Onset  . Asthma Mother   . Diabetes Father   . Stroke Father   . Heart disease Father   . Arthritis Sister   . Asthma Sister   . Asthma Daughter   . Asthma Son   . Arthritis Sister   . Asthma Sister      Prior to Admission medications   Medication Sig Start Date End Date Taking? Authorizing Provider  albuterol (PROVENTIL) (2.5 MG/3ML) 0.083% nebulizer solution Take 2.5 mg by nebulization every 6 (six) hours as needed for wheezing or shortness of breath.    Historical Provider, MD  allopurinol (ZYLOPRIM) 100 MG tablet Take 1 tablet (100 mg total) by mouth every morning. 01/22/14   Donne Hazel, MD  allopurinol (ZYLOPRIM) 100 MG tablet Take 100 mg by mouth daily.    Historical Provider, MD  ALPRAZolam Duanne Moron) 1 MG tablet Take 1 mg by mouth  3 (three) times daily as needed for anxiety (anxiety).     Historical Provider, MD  ALPRAZolam Duanne Moron) 1 MG tablet Take 1 mg by mouth at bedtime as needed for anxiety.    Historical Provider, MD  amLODipine-benazepril (LOTREL) 5-10 MG capsule Take 1 capsule by mouth daily.    Historical Provider, MD  ammonium lactate (AMLACTIN) 12 % cream Apply 1 g topically daily as needed for dry skin. 04/22/14   Linton Flemings, MD  aspirin 325 MG tablet Take 325 mg by mouth every morning.     Historical Provider, MD  atorvastatin (LIPITOR) 20 MG tablet Take 1 tablet (20 mg total) by mouth every morning. 06/11/15   Lavina Hamman, MD  benzonatate (TESSALON) 100 MG capsule Take 1 capsule (100 mg total) by mouth 3 (three) times daily as needed for cough. 06/11/15   Lavina Hamman, MD  budesonide-formoterol (SYMBICORT) 160-4.5 MCG/ACT inhaler Inhale 2 puffs into the lungs  2 (two) times daily. 11/21/14   Debbe Odea, MD  butalbital-acetaminophen-caffeine (FIORICET, ESGIC) [id number] MG tablet Take 1 tablet by mouth every 6 (six) hours as needed for headache. 06/11/15   Lavina Hamman, MD  cloNIDine (CATAPRES) 0.2 MG tablet Take 0.2 mg by mouth 2 (two) times daily.    Historical Provider, MD  cloNIDine (CATAPRES) 0.2 MG tablet Take 0.2 mg by mouth 2 (two) times daily.    Historical Provider, MD  dextromethorphan-guaiFENesin (MUCINEX DM) 30-600 MG 12hr tablet Take 1 tablet by mouth 2 (two) times daily. 06/11/15   Lavina Hamman, MD  diclofenac (VOLTAREN) 75 MG EC tablet Take 75 mg by mouth 2 (two) times daily.    Historical Provider, MD  Emollient (EUCERIN) lotion Apply 5 mLs topically 2 (two) times daily as needed for dry skin. Patient taking differently: Apply 5 mLs topically 3 (three) times daily.  04/22/14   Linton Flemings, MD  escitalopram (LEXAPRO) 10 MG tablet Take 10 mg by mouth daily.    Historical Provider, MD  folic acid (FOLVITE) 1 MG tablet Take 1 tablet (1 mg total) by mouth daily. 01/22/14   Donne Hazel, MD  folic  acid (FOLVITE) 1 MG tablet Take 1 mg by mouth daily.    Historical Provider, MD  furosemide (LASIX) 20 MG tablet Take 2 tablets (40 mg total) by mouth daily as needed for fluid or edema (WEIGHT GAIN OF MORE THEN 3 LBS IN A DAY). 06/11/15   Lavina Hamman, MD  gabapentin (NEURONTIN) 100 MG capsule Take 1 capsule (100 mg total) by mouth 2 (two) times daily. 06/11/15   Lavina Hamman, MD  gabapentin (NEURONTIN) 300 MG capsule Take 300 mg by mouth 3 (three) times daily.    Historical Provider, MD  HYDROcodone-acetaminophen (NORCO) 10-325 MG tablet Take 1 tablet by mouth every 4 (four) hours as needed for moderate pain.  09/13/15   Historical Provider, MD  hydrOXYzine (ATARAX/VISTARIL) 25 MG tablet Take 25 mg by mouth 3 (three) times daily as needed for anxiety or itching (anxiety and itching).     Historical Provider, MD  hydrOXYzine (ATARAX/VISTARIL) 25 MG tablet Take 25 mg by mouth 3 (three) times daily as needed for anxiety.  07/27/15   Historical Provider, MD  indomethacin (INDOCIN SR) 75 MG CR capsule Take 75 mg by mouth 2 (two) times daily with a meal.    Historical Provider, MD  isosorbide mononitrate (IMDUR) 30 MG 24 hr tablet Take 30 mg by mouth daily. 07/27/15   Historical Provider, MD  lisinopril (PRINIVIL,ZESTRIL) 40 MG tablet Take 40 mg by mouth daily.    Historical Provider, MD  methocarbamol (ROBAXIN) 500 MG tablet Take 1 tablet (500 mg total) by mouth every 8 (eight) hours as needed for muscle spasms. 06/11/15   Lavina Hamman, MD  mometasone-formoterol (DULERA) 100-5 MCG/ACT AERO Inhale 2 puffs into the lungs 2 (two) times daily. 06/11/15   Lavina Hamman, MD  naproxen (NAPROSYN) 500 MG tablet Take 500 mg by mouth 2 (two) times daily with a meal.    Historical Provider, MD  Omega-3 Fatty Acids (FISH OIL PO) Take 1 capsule by mouth 2 (two) times daily.    Historical Provider, MD  pantoprazole (PROTONIX) 40 MG tablet Take 1 tablet (40 mg total) by mouth every morning. 06/11/15   Lavina Hamman, MD    predniSONE (DELTASONE) 10 MG tablet Take 40mg  daily for 3days,Take 30mg  daily for 3days,Take 20mg  daily for 3days,Take 10mg  daily for  3days, then stop. 06/11/15   Lavina Hamman, MD  solifenacin (VESICARE) 5 MG tablet Take 5 mg by mouth every morning.     Historical Provider, MD  solifenacin (VESICARE) 5 MG tablet Take 5 mg by mouth daily.    Historical Provider, MD  topiramate (TOPAMAX) 100 MG tablet Take 100 mg by mouth 2 (two) times daily.    Historical Provider, MD  topiramate (TOPAMAX) 100 MG tablet Take 100 mg by mouth at bedtime.    Historical Provider, MD  VITAMIN E PO Take 1 capsule by mouth daily.    Historical Provider, MD    Physical Exam: Vitals:   12/14/15 0300 12/14/15 0353 12/14/15 0500 12/14/15 0559  BP: 127/94 128/78 (!) 94/50 100/78  Pulse: 74 75 69   Resp:  16    Temp:    97.9 F (36.6 C)  TempSrc:    Oral  SpO2: 97% 98% 96%   Weight:    (!) 202 kg (445 lb 5.3 oz)  Height:    5\' 5"  (1.651 m)   General: Not in acute distress HEENT:       Eyes: PERRL, EOMI, no scleral icterus.       ENT: No discharge from the ears and nose, no pharynx injection, no tonsillar enlargement.        Neck: Difficult to assess JVD due to morbid obesity, no bruit, no mass felt. Heme: No neck lymph node enlargement. Cardiac: S1/S2, RRR, No murmurs, No gallops or rubs. Respiratory: has wheezing bilaterally. No rales  or rubs. GI: Soft, nondistended, nontender, no rebound pain, no organomegaly, BS present. GU: No hematuria Ext: 1+ pitting leg edema bilaterally. 2+DP/PT pulse bilaterally. Musculoskeletal: No joint deformities, No joint redness or warmth, no limitation of ROM in spin. Skin: No rashes.  Neuro: Alert, oriented X3, cranial nerves II-XII grossly intact, moves all extremities. Psych: Patient is not psychotic, no suicidal or hemocidal ideation.  Labs on Admission: I have personally reviewed following labs and imaging studies  CBC:  Recent Labs Lab 12/13/15 2120  WBC 10.6*   HGB 13.2  HCT 41.9  MCV 93.7  PLT 539   Basic Metabolic Panel:  Recent Labs Lab 12/13/15 2120  NA 140  K 4.4  CL 102  CO2 29  GLUCOSE 120*  BUN 27*  CREATININE 1.26*  CALCIUM 8.8*   GFR: Estimated Creatinine Clearance: 95.9 mL/min (by C-G formula based on SCr of 1.26 mg/dL (H)). Liver Function Tests: No results for input(s): AST, ALT, ALKPHOS, BILITOT, PROT, ALBUMIN in the last 168 hours. No results for input(s): LIPASE, AMYLASE in the last 168 hours. No results for input(s): AMMONIA in the last 168 hours. Coagulation Profile:  Recent Labs Lab 12/14/15 0303  INR 0.98   Cardiac Enzymes:  Recent Labs Lab 12/14/15 0303  TROPONINI <0.03   BNP (last 3 results) No results for input(s): PROBNP in the last 8760 hours. HbA1C: No results for input(s): HGBA1C in the last 72 hours. CBG: No results for input(s): GLUCAP in the last 168 hours. Lipid Profile: No results for input(s): CHOL, HDL, LDLCALC, TRIG, CHOLHDL, LDLDIRECT in the last 72 hours. Thyroid Function Tests: No results for input(s): TSH, T4TOTAL, FREET4, T3FREE, THYROIDAB in the last 72 hours. Anemia Panel: No results for input(s): VITAMINB12, FOLATE, FERRITIN, TIBC, IRON, RETICCTPCT in the last 72 hours. Urine analysis:    Component Value Date/Time   COLORURINE YELLOW 12/01/2014 [place] 12/01/2014 2240   LABSPEC >1.030 (H) 12/01/2014 2240  PHURINE 5.0 12/01/2014 2240   GLUCOSEU NEGATIVE 12/01/2014 2240   GLUCOSEU NEG mg/dL 10/01/2007 2007   HGBUR NEGATIVE 12/01/2014 2240   HGBUR negative 12/05/2009 1003   BILIRUBINUR NEGATIVE 12/01/2014 2240   BILIRUBINUR small 07/31/2010 1713   KETONESUR NEGATIVE 12/01/2014 2240   PROTEINUR NEGATIVE 12/01/2014 2240   UROBILINOGEN 0.2 12/01/2014 2240   NITRITE NEGATIVE 12/01/2014 2240   LEUKOCYTESUR NEGATIVE 12/01/2014 2240   Sepsis Labs: @LABRCNTIP (procalcitonin:4,lacticidven:4) )No results found for this or any previous visit (from the  past 240 hour(s)).   Radiological Exams on Admission: Dg Chest 2 View  Result Date: 12/13/2015 CLINICAL DATA:  Acute onset of shortness of breath and bilateral leg swelling. Wheezing. Initial encounter. EXAM: CHEST  2 VIEW COMPARISON:  None. FINDINGS: The lungs are well-aerated. Vascular congestion is noted. A small left pleural effusion is seen. Increased interstitial markings raise concern for pulmonary edema. There is no evidence of pneumothorax. The heart is enlarged.  No acute osseous abnormalities are seen. IMPRESSION: Vascular congestion and cardiomegaly. Small left pleural effusion seen. Increased interstitial markings raise concern for pulmonary edema. Electronically Signed   By: Garald Balding M.D.   On: 12/13/2015 22:12     EKG: Independently reviewed.  Sinus rhythm, QTC 427, low voltage, occasional PAC.  Assessment/Plan Principal Problem:   Acute on chronic respiratory failure with hypoxia (HCC) Active Problems:   Morbid obesity (HCC)   Asthma exacerbation   CKD (chronic kidney disease) stage 3, GFR 30-59 ml/min   Right-sided heart failure (HCC)   OSA (obstructive sleep apnea)   Darier's disease   Essential hypertension   HLD (hyperlipidemia)   GERD (gastroesophageal reflux disease)   Gout   Acute on chronic diastolic (congestive) heart failure (HCC)   Chest pain   Acute on chronic respiratory failure with hypoxia ([place]): Likely due to combination of CHF and asthma exacerbation. Patient has bilateral leg edema, pulmonary congestion on chest x-ray, consistent with CHF exacerbation. Her BNP is 45.7, which is likely falsely low due to obesity. Patient has wheezing and coughing, consistent with asthma exacerbation. Another potential differential diagnosis is pulmonary embolism given pleuritic chest pain and elevated d-dimer.  -will place pt on tele bed for obs -start IV heparin empircaly for PE, and f/u V/Q scan -treat CHF exacerbation and asthma exacerbation as  below  Asthma exacerbation: -Nebulizers: scheduled Duoneb and prn albuterol -Solu-Medrol 80 mg IV bid -Mucinex for cough  -Follow up  sputum culture, respiratory virus panel  Acute on chronic diastolic CHF and right heart failure: 2-D echo on 11/17/14 showed EF 60-65%.  -she received 80 mg of lasix in Ed, will continue Lasix 40 mg daily by IV -trop x 3 -2d echo -will continue ASA and lisinopril -Daily weights -strict I/O's -Low salt diet  HTN: -continue lisinopril, clonidine, -IV Lasix  CKD-III: stable. Baseline creatinine 1.1-1.3. Patient's creatinine 1.26, BUN 27 on admission. -Follow-up renal function. BMP  OSA: -CPAP  GERD: -Protonix  Gout: -continue home allopurinol, and indomethacin  HLD: Last LDL was 119 on 08/01/12 -Continue home medications: Lipitor  Chest pain: Patient has pleuritic chest pain -Follow-up VQ scan as above -On IV heparin -Continue aspirin, Lipitor, when necessary nitroglycerin, Imdure and prn Morphine -trop x 3  -f/u 2d echo -LE venous doppler  Depression and anxiety: Stable, no suicidal or homicidal ideations. -Continue home medications: Lexapro, when necessary Xanax   DVT ppx: On IV heparin Code Status: Full code Family Communication: None at bed side.  Disposition Plan:  Anticipate discharge back  to previous home environment Consults called:  none Admission status: Obs / tele     Date of Service 12/14/2015    Ivor Costa [facility] Pager [phone]  If 7PM-7AM, please contact night-coverage [email or web address] Password [email or web address] 12/14/2015, 7:49 AM

## 2015-12-14 NOTE — Progress Notes (Signed)
PT Cancellation Note  Patient Details Name: Kara Vincent MRN: [id number] DOB: March 17, 1966   Cancelled Treatment:     PT order received but eval deferred.  Awaiting VQ scan to r/o PE.  Will follow.   Candence Sease 12/14/2015, 1:31 PM

## 2015-12-14 NOTE — Progress Notes (Signed)
Echocardiogram 2D Echocardiogram has been performed with definity.  Kara Vincent 12/14/2015, 10:57 AM

## 2015-12-15 DIAGNOSIS — I509 Heart failure, unspecified: Secondary | ICD-10-CM

## 2015-12-15 DIAGNOSIS — N183 Chronic kidney disease, stage 3 (moderate): Secondary | ICD-10-CM

## 2015-12-15 DIAGNOSIS — G4733 Obstructive sleep apnea (adult) (pediatric): Secondary | ICD-10-CM

## 2015-12-15 LAB — BASIC METABOLIC PANEL
Anion gap: 8 (ref 5–15)
BUN: 29 mg/dL — AB (ref 6–20)
CHLORIDE: 97 mmol/L — AB (ref 101–111)
CO2: 33 mmol/L — ABNORMAL HIGH (ref 22–32)
CREATININE: 1.42 mg/dL — AB (ref 0.44–1.00)
Calcium: 8.5 mg/dL — ABNORMAL LOW (ref 8.9–10.3)
GFR calc Af Amer: 49 mL/min — ABNORMAL LOW (ref >60)
GFR calc non Af Amer: 42 mL/min — ABNORMAL LOW (ref >60)
Glucose, Bld: 176 mg/dL — ABNORMAL HIGH (ref 65–99)
Potassium: 5 mmol/L (ref 3.5–5.1)
SODIUM: 138 mmol/L (ref 135–145)

## 2015-12-15 LAB — CBC
HCT: 41.4 % (ref 36.0–46.0)
Hemoglobin: 12.8 g/dL (ref 12.0–15.0)
MCH: 29 pg (ref 26.0–34.0)
MCHC: 30.9 g/dL (ref 30.0–36.0)
MCV: 93.9 fL (ref 78.0–100.0)
PLATELETS: 201 10*3/uL (ref 150–400)
RBC: 4.41 MIL/uL (ref 3.87–5.11)
RDW: 14.4 % (ref 11.5–15.5)
WBC: 7.6 10*3/uL (ref 4.0–10.5)

## 2015-12-15 LAB — HEMOGLOBIN A1C
Hgb A1c MFr Bld: 5.5 % (ref 4.8–5.6)
MEAN PLASMA GLUCOSE: 111 mg/dL

## 2015-12-15 MED ORDER — IPRATROPIUM-ALBUTEROL 0.5-2.5 (3) MG/3ML IN SOLN
3.0000 mL | Freq: Three times a day (TID) | RESPIRATORY_TRACT | Status: DC
Start: 2015-12-15 — End: 2015-12-24
  Administered 2015-12-15 – 2015-12-24 (×24): 3 mL via RESPIRATORY_TRACT
  Filled 2015-12-15 (×27): qty 3

## 2015-12-15 MED ORDER — HYDROCODONE-ACETAMINOPHEN 5-325 MG PO TABS
2.0000 | ORAL_TABLET | Freq: Once | ORAL | Status: AC
  Administered 2015-12-15: 2 via ORAL
  Filled 2015-12-15: qty 2

## 2015-12-15 MED ORDER — SODIUM CHLORIDE 0.9 % IV BOLUS (SEPSIS)
250.0000 mL | Freq: Once | INTRAVENOUS | Status: AC
Start: 2015-12-15 — End: 2015-12-15
  Administered 2015-12-15: 250 mL via INTRAVENOUS

## 2015-12-15 MED ORDER — GUAIFENESIN-DM 100-10 MG/5ML PO SYRP
5.0000 mL | ORAL_SOLUTION | ORAL | Status: DC | PRN
  Administered 2015-12-16 – 2015-12-24 (×19): 5 mL via ORAL
  Filled 2015-12-15 (×22): qty 10

## 2015-12-15 MED ORDER — METHYLPREDNISOLONE SODIUM SUCC 40 MG IJ SOLR
40.0000 mg | Freq: Two times a day (BID) | INTRAMUSCULAR | Status: DC
  Administered 2015-12-15 – 2015-12-17 (×4): 40 mg via INTRAVENOUS
  Filled 2015-12-15 (×4): qty 1

## 2015-12-15 NOTE — Progress Notes (Signed)
Assessment unchanged, resting without

## 2015-12-15 NOTE — Progress Notes (Signed)
Patient's BP 86/49 HR 54.  Notified Dr. Ree Kida new orders received.

## 2015-12-15 NOTE — Progress Notes (Signed)
CSW received consult for SNF placement, note PT evaluation recommended no PT follow-up.   No further CSW needs identified - CSW signing off.   Raynaldo Opitz, [facility] Clinical Social Worker cell #: [phone]

## 2015-12-15 NOTE — Evaluation (Signed)
Physical Therapy Evaluation Patient Details Name: Kara Vincent MRN: [id number] DOB: Jun 09, 1965 Today's Date: 12/15/2015   History of Present Illness  50 yo female admitted with SOB respiratory failure with hypoxia. pt noted to have Asthma exacerbation and acute on chronic CHF. PMH:  Clinical Impression  Pt admitted as above and presenting with functional mobility limitations 2* SOB with exertion, permorbid deconditioning and mild ambulatory balance deficits.  Pt should progress to dc home with assist of significant other.    Follow Up Recommendations No PT follow up    Equipment Recommendations  Other (comment) (TBD)    Recommendations for Other Services OT consult     Precautions / Restrictions Precautions Precautions: Fall Restrictions Weight Bearing Restrictions: No      Mobility  Bed Mobility               General bed mobility comments: NT - OOB with nursing  Transfers Overall transfer level: Needs assistance Equipment used: Rolling walker (2 wheeled) Transfers: Sit to/from Stand Sit to Stand: Min guard Stand pivot transfers: Min guard       General transfer comment: pt able to pivot without UE support 3n1 to bed. Pt transfering from EOB to chair with RW   Ambulation/Gait Ambulation/Gait assistance: Min guard Ambulation Distance (Feet): 55 Feet (twice) Assistive device: Rolling walker (2 wheeled) Gait Pattern/deviations: Step-through pattern;Decreased step length - right;Decreased step length - left;Shuffle;Antalgic;Trunk flexed;Wide base of support Gait velocity: decr Gait velocity interpretation: Below normal speed for age/gender General Gait Details: min cues for posture and position from RW; distance ltd by c/o fatigue, mild SOB on 3L and back and LE discomfort  Stairs            Wheelchair Mobility    Modified Rankin (Stroke Patients Only)       Balance Overall balance assessment: Needs assistance Sitting-balance support: Feet  supported Sitting balance-Leahy Scale: Good     Standing balance support: No upper extremity supported Standing balance-Leahy Scale: Fair                               Pertinent Vitals/Pain Pain Assessment: Faces Faces Pain Scale: Hurts little more Pain Location: back pain and hip pain Pain Descriptors / Indicators: Discomfort Pain Intervention(s): Limited activity within patient's tolerance;Monitored during session;Premedicated before session;Repositioned    Home Living Family/patient expects to be discharged to:: Private residence Living Arrangements: Other (Comment) Available Help at Discharge: Family;Available 24 hours/day Type of Home: Other(Comment) (motel room) Home Access: Level entry     Home Layout: One level Home Equipment: Chartered certified accountant - single point Additional Comments: pt reports damange to single point cane and needed incr time to describe the deficit to cane    Prior Function Level of Independence: Independent with assistive device(s)   Gait / Transfers Assistance Needed: uses cane inside motel to get to the bathroom. uses scooter in the community  ADL's / Homemaking Assistance Needed: has fiance that is a caregiver that helps with bathing        Hand Dominance   Dominant Hand: Right    Extremity/Trunk Assessment   Upper Extremity Assessment: Overall WFL for tasks assessed           Lower Extremity Assessment: Generalized weakness      Cervical / Trunk Assessment: Kyphotic  Communication   Communication: No difficulties  Cognition Arousal/Alertness: Awake/alert Behavior During Therapy: WFL for tasks assessed/performed Overall Cognitive Status: Within Functional Limits  for tasks assessed                      General Comments General comments (skin integrity, edema, etc.): wounds noted on bil LE with edema. Pt with skin break down under bil breast     Exercises     Assessment/Plan    PT Assessment Patient  needs continued PT services  PT Problem List Decreased strength;Decreased activity tolerance;Decreased balance;Decreased mobility;Decreased knowledge of use of DME;Obesity;Pain          PT Treatment Interventions DME instruction;Gait training;Functional mobility training;Therapeutic activities;Therapeutic exercise;Balance training;Patient/family education    PT Goals (Current goals can be found in the Care Plan section)  Acute Rehab PT Goals Patient Stated Goal: none stated by patient PT Goal Formulation: With patient Time For Goal Achievement: 12/22/15 Potential to Achieve Goals: Good    Frequency Min 3X/week   Barriers to discharge        Co-evaluation PT/OT/SLP Co-Evaluation/Treatment: Yes Reason for Co-Treatment: For patient/therapist safety PT goals addressed during session: Mobility/safety with mobility OT goals addressed during session: ADL's and self-care       End of Session Equipment Utilized During Treatment: Oxygen Activity Tolerance: Patient tolerated treatment well;Patient limited by fatigue Patient left: in chair;with call bell/phone within reach;with family/visitor present Nurse Communication: Mobility status         Time: 1120-1140 PT Time Calculation (min) (ACUTE ONLY): 20 min   Charges:   PT Evaluation $PT Eval Moderate Complexity: 1 Procedure     PT G Codes:        Kara Vincent 2015/12/31, 2:57 PM

## 2015-12-15 NOTE — Progress Notes (Signed)
PROGRESS NOTE    Kara Vincent  XBM:[id number] DOB: [birth year] DOA: 12/13/2015 PCP: No PCP Per Patient   Chief Complaint  Patient presents with  . Shortness of Breath  . Chest Pain    Brief Narrative:  HPI on 12/14/2015 by Dr. Lorretta Harp Kara Vincent is a 50 y.o. female with medical history significant of hypertension, hyperlipidemia, asthma, OSA on CPAP, morbid obesity, right heart failure, diastolic congestive heart failure, drug abuse, Darer's disease, wheelchair-bound, CKD-III, GERD, gout, anxiety, who presents with shortness of breath, chest pain, leg edema, cough.  Patient states that she has been having shortness of breath, chest pain, cough for more than one week. Her chest pain is located in left lower chest. It is constant, 8 out of 10 in severity, nonradiating, pleuritic, aggravated by coughing and deep breath. He coughs up yellow colored sputum. No fever or chills. He has bilateral lower leg pain, but it not localized to calf areas. He has bilateral lower leg edema. Denies nausea, vomiting, abdominal pain, diarrhea, symptoms of UTI. She is wheelchair bounded, no unilateral weakness, vision change or hearing loss.  Assessment & Plan   Acute on chronic respiratory failure with hypoxia -Likely multifactorial including CHF vs asthma exacerbation -BNP 45, however chest x-ray does show pulmonary edema and vascular congestion -On exam, patient does have significant wheezing as well as coughing -VQ scan showed low probability of pulmonary embolus. LE doppler showed no evidence of DVT or SVT -Changed to DVT prophylactic dose of heparin  Asthma exacerbation -Continue nebs, Solu-Medrol, Mucinex -Improving slowly -Chest x-ray showed vascular congestion with cardiomegaly, small left pleural effusion, possible pulmonary edema -Respiratory viral panel unremarkable  Acute on chronic diastolic heart failure -Echocardiogram: EF 60-65%, grade 1 diastolic dysfunction -Monitor intake  and output, daily weights -BNP 45 -Patient was given Lasix 80 mg IV in the emergency department -Continue Lasix 40 mg IV daily  Essential hypertension -Continue clonidine, lisinopril, IV Lasix  Chronic kidney disease, stage III -Baseline creatinine approximately 1.2-1.3, currently -Continue to monitor BMP  Obstructive sleep apnea -Continue CPAP QHS -Patient stated she lost or displaced her CPAP back in 2013.  -Will speak to case management regarding possibly obtaining another CPAP vs outpatient sleep study  GERD -Continue PPI  Gout -Continue allopurinol and indomethacin  Hyperlipidemia -Continue Lipitor  Chest pain -Likely pleuritic -Troponin unremarkable -Echocardiogram as above -Continue aspirin, statin, Imdur   Depression/anxiety -Continue Lexapro, Xanax PRN  Headache -States she takes pain medicine for this -Would like a scan of her head.  Patient has no neurological deficits. -Defer to outpatient workup and follow up -Continue pain control  DVT Prophylaxis  heparin   Code Status: Full  Family Communication: None at bedside  Disposition Plan: Admitted. Will consult case management and PT/OT eval. Possibly discharge on 9/24.  Consultants None  Procedures  Echocardiogram Lower extremity Doppler VQ scan  Antibiotics   Anti-infectives    None      Subjective:   Kara Vincent seen and examined today. Continues to complain of some shortness of breath and side pain. Complains of headache.  Denies abdominal pain, nausea, vomiting, diarrhea, constipation, dizziness.   Objective:   Vitals:   12/15/15 0445 12/15/15 0559 12/15/15 0937 12/15/15 1016  BP:  (!) 92/55  (!) 105/55  Pulse:  60  69  Resp:  18    Temp:  98.4 F (36.9 C)    TempSrc:      SpO2:  93% 95%   Weight: (!) 183.8 kg (  405 lb 1.6 oz)     Height:        Intake/Output Summary (Last 24 hours) at 12/15/15 1103 Last data filed at 12/15/15 0100  Gross per 24 hour  Intake            218.88 ml  Output                0 ml  Net           218.88 ml   Filed Weights   12/13/15 2113 12/14/15 0559 12/15/15 0445  Weight: (!) 182.8 kg (403 lb) (!) 202 kg (445 lb 5.3 oz) (!) 183.8 kg (405 lb 1.6 oz)    Exam  General: Well developed, well nourished, chronically ill  HEENT: NCAT, mucous membranes moist.   Cardiovascular: S1 S2 auscultated, ?1/6SEM, RRR  Respiratory: Diminished breath sounds, but clear.  No wheezing noted.  Abdomen: Soft, Morbidly obese, nontender, nondistended, + bowel sounds  Extremities: warm dry without cyanosis clubbing or edema  Neuro: AAOx3, Nonfocal  Psych: Appropriate   Data Reviewed: I have personally reviewed following labs and imaging studies  CBC:  Recent Labs Lab 12/13/15 2120 12/15/15 0530  WBC 10.6* 7.6  HGB 13.2 12.8  HCT 41.9 41.4  MCV 93.7 93.9  PLT 239 201   Basic Metabolic Panel:  Recent Labs Lab 12/13/15 2120 12/15/15 0530  NA 140 138  K 4.4 5.0  CL 102 97*  CO2 29 33*  GLUCOSE 120* 176*  BUN 27* 29*  CREATININE 1.26* 1.42*  CALCIUM 8.8* 8.5*   GFR: Estimated Creatinine Clearance: 79.7 mL/min (by C-G formula based on SCr of 1.42 mg/dL (H)). Liver Function Tests: No results for input(s): AST, ALT, ALKPHOS, BILITOT, PROT, ALBUMIN in the last 168 hours. No results for input(s): LIPASE, AMYLASE in the last 168 hours. No results for input(s): AMMONIA in the last 168 hours. Coagulation Profile:  Recent Labs Lab 12/14/15 0303  INR 0.98   Cardiac Enzymes:  Recent Labs Lab 12/14/15 0303 12/14/15 0757 12/14/15 1613  TROPONINI <0.03 <0.03 <0.03   BNP (last 3 results) No results for input(s): PROBNP in the last 8760 hours. HbA1C:  Recent Labs  12/14/15 0303  HGBA1C 5.5   CBG: No results for input(s): GLUCAP in the last 168 hours. Lipid Profile:  Recent Labs  12/14/15 0303  CHOL 186  HDL 38*  LDLCALC 122*  TRIG 129  CHOLHDL 4.9   Thyroid Function Tests: No results for input(s):  TSH, T4TOTAL, FREET4, T3FREE, THYROIDAB in the last 72 hours. Anemia Panel: No results for input(s): VITAMINB12, FOLATE, FERRITIN, TIBC, IRON, RETICCTPCT in the last 72 hours. Urine analysis:    Component Value Date/Time   COLORURINE YELLOW 12/01/2014 2240   APPEARANCEUR CLEAR 12/01/2014 2240   LABSPEC >1.030 (H) 12/01/2014 2240   PHURINE 5.0 12/01/2014 2240   GLUCOSEU NEGATIVE 12/01/2014 2240   GLUCOSEU NEG mg/dL 16/12/9602 5409   HGBUR NEGATIVE 12/01/2014 2240   HGBUR negative 12/05/2009 1003   BILIRUBINUR NEGATIVE 12/01/2014 2240   BILIRUBINUR small 07/31/2010 1713   KETONESUR NEGATIVE 12/01/2014 2240   PROTEINUR NEGATIVE 12/01/2014 2240   UROBILINOGEN 0.2 12/01/2014 2240   NITRITE NEGATIVE 12/01/2014 2240   LEUKOCYTESUR NEGATIVE 12/01/2014 2240   Sepsis Labs: @LABRCNTIP (procalcitonin:4,lacticidven:4)  ) Recent Results (from the past 240 hour(s))  Respiratory Panel by PCR     Status: None   Collection Time: 12/14/15  6:11 AM  Result Value Ref Range Status   Adenovirus NOT DETECTED NOT DETECTED Final  Coronavirus 229E NOT DETECTED NOT DETECTED Final   Coronavirus HKU1 NOT DETECTED NOT DETECTED Final   Coronavirus NL63 NOT DETECTED NOT DETECTED Final   Coronavirus OC43 NOT DETECTED NOT DETECTED Final   Metapneumovirus NOT DETECTED NOT DETECTED Final   Rhinovirus / Enterovirus NOT DETECTED NOT DETECTED Final   Influenza A NOT DETECTED NOT DETECTED Final   Influenza B NOT DETECTED NOT DETECTED Final   Parainfluenza Virus 1 NOT DETECTED NOT DETECTED Final   Parainfluenza Virus 2 NOT DETECTED NOT DETECTED Final   Parainfluenza Virus 3 NOT DETECTED NOT DETECTED Final   Parainfluenza Virus 4 NOT DETECTED NOT DETECTED Final   Respiratory Syncytial Virus NOT DETECTED NOT DETECTED Final   Bordetella pertussis NOT DETECTED NOT DETECTED Final   Chlamydophila pneumoniae NOT DETECTED NOT DETECTED Final   Mycoplasma pneumoniae NOT DETECTED NOT DETECTED Final    Comment: Performed  at [facility]  MRSA PCR Screening     Status: Abnormal   Collection Time: 12/14/15  6:11 AM  Result Value Ref Range Status   MRSA by PCR POSITIVE (A) NEGATIVE Final    Comment:        The GeneXpert MRSA Assay (FDA approved for NASAL specimens only), is one component of a comprehensive MRSA colonization surveillance program. It is not intended to diagnose MRSA infection nor to guide or monitor treatment for MRSA infections. RESULT CALLED TO, READ BACK BY AND VERIFIED WITH: Shari Heritage, RN AT 11:53 12/14/15 BY Dorris Carnes THOMPSON       Radiology Studies: Dg Chest 2 View  Result Date: 12/13/2015 CLINICAL DATA:  Acute onset of shortness of breath and bilateral leg swelling. Wheezing. Initial encounter. EXAM: CHEST  2 VIEW COMPARISON:  None. FINDINGS: The lungs are well-aerated. Vascular congestion is noted. A small left pleural effusion is seen. Increased interstitial markings raise concern for pulmonary edema. There is no evidence of pneumothorax. The heart is enlarged.  No acute osseous abnormalities are seen. IMPRESSION: Vascular congestion and cardiomegaly. Small left pleural effusion seen. Increased interstitial markings raise concern for pulmonary edema. Electronically Signed   By: Roanna Raider M.D.   On: 12/13/2015 22:12   Nm Pulmonary Perf And Vent  Result Date: 12/14/2015 CLINICAL DATA:  Shortness of breath and decreased oxygen saturation EXAM: NUCLEAR MEDICINE VENTILATION - PERFUSION LUNG SCAN Views: Anterior, posterior, LPO, RPO, LAO, RAO -ventilation and perfusion. Patient could not tolerate lateral imaging. RADIOPHARMACEUTICALS:  29.7 mCi Technetium-76m DTPA aerosol inhalation and 4.17 mCi Technetium-57m MAA IV COMPARISON:  Chest radiograph December 13, 2015 FINDINGS: Ventilation: Radiotracer uptake bilaterally is homogeneous and symmetric bilaterally. No focal ventilation defects are evident. Perfusion: Radiotracer uptake bilaterally is homogeneous and symmetric bilaterally. No  focal perfusion defects are evident. There is no appreciable ventilation/perfusion mismatch. IMPRESSION: No ventilation or perfusion defects evident. Very low probability of pulmonary embolus. Electronically Signed   By: Bretta Bang III M.D.   On: 12/14/2015 16:13     Scheduled Meds: . allopurinol  100 mg Oral q morning - 10a  . aspirin  325 mg Oral q morning - 10a  . atorvastatin  20 mg Oral q morning - 10a  . Chlorhexidine Gluconate Cloth  6 each Topical Q0600  . cloNIDine  0.2 mg Oral BID  . darifenacin  7.5 mg Oral Daily  . dextromethorphan-guaiFENesin  1 tablet Oral BID  . escitalopram  10 mg Oral Daily  . folic acid  1 mg Oral Daily  . furosemide  40 mg Intravenous Daily  .  gabapentin  100 mg Oral BID  . heparin subcutaneous  5,000 Units Subcutaneous Q8H  . indomethacin  75 mg Oral BID WC  . ipratropium-albuterol  3 mL Nebulization TID  . isosorbide mononitrate  30 mg Oral Daily  . lisinopril  40 mg Oral Daily  . lubriderm seriously sensitive  1 application Topical TID  . methylPREDNISolone (SOLU-MEDROL) injection  40 mg Intravenous BID  . mupirocin ointment  1 application Nasal BID  . omega-3 acid ethyl esters  1 g Oral Daily  . pantoprazole  40 mg Oral q morning - 10a  . topiramate  100 mg Oral BID  . vitamin E  100 Units Oral Daily   Continuous Infusions:     LOS: 1 day   Time Spent in minutes   30 minutes  Mauria Asquith D.O. on 12/15/2015 at 11:03 AM  Between 7am to 7pm - Pager - [phone]  After 7pm go to [email or web address] - password [email or web address]  And look for the night coverage person covering for me after hours  [facility] Office  [phone]

## 2015-12-15 NOTE — Evaluation (Signed)
Occupational Therapy Evaluation Patient Details Name: Kara Vincent MRN: [id number] DOB: 1965-04-01 Today's Date: 12/15/2015    History of Present Illness 50 yo female admitted with SOB respiratory failure with hypoxia. pt noted to have Asthma exacerbation and acute on chronic CHF. PMH:   Clinical Impression   PT admitted with respiratory failure with hypoxia.[name] Pt currently with functional limitiations due to the deficits listed below (see OT problem list). Pt living in a motel and requesting equipment for home. Pt reports no oxygen for x2 weeks and a breathing machine.  Pt will benefit from skilled OT to increase their independence and safety with adls and balance to allow discharge home without follow up.     Follow Up Recommendations  No OT follow up    Equipment Recommendations  Other (comment) (see PT for equipment needs)    Recommendations for Other Services       Precautions / Restrictions Precautions Precautions: Fall      Mobility Bed Mobility               General bed mobility comments: pt able to transfer from 3n1 to sitting EOB  min guard (A).  Pt transfered to chair   Transfers Overall transfer level: Needs assistance Equipment used: Rolling walker (2 wheeled) Transfers: Sit to/from [facility] Sit to Stand: Supervision Stand pivot transfers: Min guard       General transfer comment: pt able to pivot without UE support 3n1 to bed. Pt transfering from EOB to chair with RW     Balance                                            ADL Overall ADL's : Needs assistance/impaired Eating/Feeding: Independent   Grooming: Wash/dry hands;Wash/dry face;Oral care;Applying deodorant;Min guard   Upper Body Bathing: Minimal assitance;Sitting Upper Body Bathing Details (indicate cue type and reason): requires (A) for detail to hygiene and reach back Lower Body Bathing: Moderate assistance Lower Body Bathing Details  (indicate cue type and reason): pt noted to have multiple cuts and scratches on bil LE Upper Body Dressing : Supervision/safety       Toilet Transfer: Min Psychiatric nurse Details (indicate cue type and reason): stand pivot from 3n1 to bed Toileting- Clothing Manipulation and Hygiene: Min guard       Functional mobility during ADLs: Min guard General ADL Comments: pt able to use RW to transfer > 20 feet and could transfer within motel to bathroom. pt requesting breathing machine, home oxygen, bedside commode, shower seat, and new cane. Pt could greatly benefit from oxygen since 3L is baseline. Fiance and patient report x2 weeks without any oxygen.      Vision     Perception     Praxis      Pertinent Vitals/Pain Pain Assessment: Faces Faces Pain Scale: Hurts little more Pain Location: back pain and hip pain Pain Descriptors / Indicators: Discomfort Pain Intervention(s): Limited activity within patient's tolerance;Monitored during session;Premedicated before session;Repositioned     Hand Dominance Right   Extremity/Trunk Assessment Upper Extremity Assessment Upper Extremity Assessment: Overall WFL for tasks assessed   Lower Extremity Assessment Lower Extremity Assessment: Defer to PT evaluation   Cervical / Trunk Assessment Cervical / Trunk Assessment: Kyphotic   Communication Communication Communication: No difficulties   Cognition Arousal/Alertness: Awake/alert Behavior During Therapy: WFL for tasks assessed/performed Overall Cognitive  Status: Within Functional Limits for tasks assessed                     General Comments       Exercises       Shoulder Instructions      Home Living Family/patient expects to be discharged to:: Private residence Living Arrangements: Other (Comment) (fiance whom is the caregiver) Available Help at Discharge: Family;Available 24 hours/day Type of Home: Other(Comment) (motel room) Home Access: Level entry      Home Layout: One level     Bathroom Shower/Tub: Teacher, early years/pre: Standard     Home Equipment: Financial trader - single point   Additional Comments: pt reports damange to single point cane and needed incr time to describe the deficit to cane      Prior Functioning/Environment Level of Independence: Independent with assistive device(s)  Gait / Transfers Assistance Needed: uses cane inside motel to get to the bathroom. uses scooter in the community ADL's / Homemaking Assistance Needed: has fiance that is a caregiver that helps with bathing            OT Problem List: Decreased strength;Decreased activity tolerance;Impaired balance (sitting and/or standing);Decreased knowledge of use of DME or AE;Decreased safety awareness;Decreased knowledge of precautions;Obesity;Cardiopulmonary status limiting activity   OT Treatment/Interventions: Self-care/ADL training;Therapeutic exercise;Energy conservation;DME and/or AE instruction;Therapeutic activities;Balance training;Patient/family education    OT Goals(Current goals can be found in the care plan section) Acute Rehab OT Goals Patient Stated Goal: none stated by patient OT Goal Formulation: With patient Time For Goal Achievement: 12/29/15 Potential to Achieve Goals: Good  OT Frequency: Min 2X/week   Barriers to D/C:            Co-evaluation PT/OT/SLP Co-Evaluation/Treatment: Yes Reason for Co-Treatment: For patient/therapist safety   OT goals addressed during session: ADL's and self-care;Strengthening/ROM      End of Session Equipment Utilized During Treatment: Gait belt;Rolling walker Nurse Communication: Mobility status;Precautions  Activity Tolerance: Patient tolerated treatment well Patient left: in chair;with call bell/phone within reach   Time: 1110-1138 OT Time Calculation (min): 28 min Charges:  OT General Charges $OT Visit: 1 Procedure OT Evaluation $OT Eval Moderate Complexity: 1  Procedure G-Codes:    Parke Poisson B 08-Jan-2016, 12:02 PM   Jeri Modena   OTR/L Pager: [phone] Office: [phone] .

## 2015-12-16 ENCOUNTER — Inpatient Hospital Stay (HOSPITAL_COMMUNITY): Payer: Medicaid Other

## 2015-12-16 ENCOUNTER — Encounter (HOSPITAL_COMMUNITY): Payer: Self-pay | Admitting: Radiology

## 2015-12-16 LAB — CBC
HEMATOCRIT: 38.5 % (ref 36.0–46.0)
Hemoglobin: 11.8 g/dL — ABNORMAL LOW (ref 12.0–15.0)
MCH: 28.9 pg (ref 26.0–34.0)
MCHC: 30.6 g/dL (ref 30.0–36.0)
MCV: 94.1 fL (ref 78.0–100.0)
Platelets: 189 10*3/uL (ref 150–400)
RBC: 4.09 MIL/uL (ref 3.87–5.11)
RDW: 14.2 % (ref 11.5–15.5)
WBC: 13.2 10*3/uL — ABNORMAL HIGH (ref 4.0–10.5)

## 2015-12-16 LAB — BASIC METABOLIC PANEL
Anion gap: 9 (ref 5–15)
BUN: 44 mg/dL — AB (ref 6–20)
CALCIUM: 8.2 mg/dL — AB (ref 8.9–10.3)
CO2: 30 mmol/L (ref 22–32)
Chloride: 97 mmol/L — ABNORMAL LOW (ref 101–111)
Creatinine, Ser: 1.74 mg/dL — ABNORMAL HIGH (ref 0.44–1.00)
GFR calc Af Amer: 38 mL/min — ABNORMAL LOW (ref >60)
GFR, EST NON AFRICAN AMERICAN: 33 mL/min — AB (ref >60)
GLUCOSE: 156 mg/dL — AB (ref 65–99)
POTASSIUM: 5.1 mmol/L (ref 3.5–5.1)
Sodium: 136 mmol/L (ref 135–145)

## 2015-12-16 MED ORDER — BENZONATATE 100 MG PO CAPS
200.0000 mg | ORAL_CAPSULE | Freq: Three times a day (TID) | ORAL | Status: DC
  Administered 2015-12-16 – 2015-12-24 (×25): 200 mg via ORAL
  Filled 2015-12-16 (×25): qty 2

## 2015-12-16 MED ORDER — BENZONATATE 100 MG PO CAPS: 100.0000 mg | ORAL_CAPSULE | Freq: Two times a day (BID) | ORAL | Status: DC | PRN

## 2015-12-16 NOTE — Progress Notes (Signed)
PROGRESS NOTE    Kara Vincent  OHY:[id number] DOB: 02/05/1966 DOA: 12/13/2015 PCP: No PCP Per Patient   Chief Complaint  Patient presents with  . Shortness of Breath  . Chest Pain    Brief Narrative:  HPI on 12/14/2015 by Dr. Lorretta Harp Kara Vincent is a 50 y.o. female with medical history significant of hypertension, hyperlipidemia, asthma, OSA on CPAP, morbid obesity, right heart failure, diastolic congestive heart failure, drug abuse, Darer's disease, wheelchair-bound, CKD-III, GERD, gout, anxiety, who presents with shortness of breath, chest pain, leg edema, cough.  Patient states that she has been having shortness of breath, chest pain, cough for more than one week. Her chest pain is located in left lower chest. It is constant, 8 out of 10 in severity, nonradiating, pleuritic, aggravated by coughing and deep breath. He coughs up yellow colored sputum. No fever or chills. He has bilateral lower leg pain, but it not localized to calf areas. He has bilateral lower leg edema. Denies nausea, vomiting, abdominal pain, diarrhea, symptoms of UTI. She is wheelchair bounded, no unilateral weakness, vision change or hearing loss.  Assessment & Plan   Acute on chronic respiratory failure with hypoxia/Cough -Likely multifactorial including CHF vs asthma exacerbation -BNP 45, however chest x-ray does show pulmonary edema and vascular congestion -On admission, patient did have significant wheezing as well as coughing -Appears to be improving, however, patient claims to be short of breath -VQ scan showed low probability of pulmonary embolus. LE doppler showed no evidence of DVT or SVT -Changed to DVT prophylactic dose of heparin -Continue antitussives -Obtain repeat CXR  Asthma exacerbation -Continue nebs, Solu-Medrol, Mucinex -Improving -Chest x-ray showed vascular congestion with cardiomegaly, small left pleural effusion, possible pulmonary edema -Respiratory viral panel  unremarkable -Will wean to oral prednisone  Acute on chronic diastolic heart failure -Echocardiogram: EF 60-65%, grade 1 diastolic dysfunction -Monitor intake and output, daily weights -BNP 45 -Patient was given Lasix 80 mg IV in the emergency department -Hold lasix today  Essential hypertension -BP soft to normal -Will hold clonidine and lisinopril -Will hold lasix   Chronic kidney disease, stage III -Baseline creatinine approximately 1.2-1.3, currently 1.74 -Continue to monitor BMP  Obstructive sleep apnea -Continue CPAP QHS -Patient stated she lost or displaced her CPAP back in 2013.  -Spoke to case management regarding possibly obtaining another CPAP vs outpatient sleep study  GERD -Continue PPI  Gout -Continue allopurinol and indomethacin  Hyperlipidemia -Continue Lipitor  Chest pain -Likely pleuritic -Troponin unremarkable -Echocardiogram as above -Continue aspirin, statin, Imdur   Depression/anxiety -Continue Lexapro, Xanax PRN  Headache -States she takes pain medicine for this -Would like a scan of her head.  Patient has no neurological deficits. -Defer to outpatient workup and follow up -Continue pain control -Continues to ask for CT head, will obtain.   DVT Prophylaxis  heparin   Code Status: Full  Family Communication: None at bedside  Disposition Plan: Admitted. Will likely discharge home on 9/25  Consultants None  Procedures  Echocardiogram Lower extremity Doppler VQ scan  Antibiotics   Anti-infectives    None      Subjective:   Kara Vincent seen and examined today. Continues to complain of some shortness of breath and side pain, and headache.  Denies abdominal pain, nausea, vomiting, diarrhea, constipation, dizziness.   Per RN, patient was able to get out of bed and walk without being short of breath.  Objective:   Vitals:   12/15/15 1500 12/15/15 2049 12/16/15 0610 12/16/15  0916  BP: (!) 92/51 (!) 92/35 (!) 91/40    Pulse:  (!) 53 (!) 58   Resp:  16 16   Temp:  97.7 F (36.5 C) 98.5 F (36.9 C)   TempSrc:  Oral Oral   SpO2: 100% 95% 91% 96%  Weight:      Height:        Intake/Output Summary (Last 24 hours) at 12/16/15 1114 Last data filed at 12/16/15 0900  Gross per 24 hour  Intake             1250 ml  Output             1650 ml  Net             -400 ml   Filed Weights   12/13/15 2113 12/14/15 0559 12/15/15 0445  Weight: (!) 182.8 kg (403 lb) (!) 202 kg (445 lb 5.3 oz) (!) 183.8 kg (405 lb 1.6 oz)    Exam  General: Well developed, well nourished, chronically ill, morbidly obese  HEENT: NCAT, mucous membranes moist.   Cardiovascular: S1 S2 auscultated, ?1/6SEM, RRR  Respiratory: Diminished breath sounds, but clear.  No wheezing noted. Appears comfortable, no increased work of breathing   Abdomen: Soft, Morbidly obese, nontender, nondistended, + bowel sounds  Extremities: warm dry without cyanosis clubbing or edema  Neuro: AAOx3, Nonfocal  Psych: Appropriate   Data Reviewed: I have personally reviewed following labs and imaging studies  CBC:  Recent Labs Lab 12/13/15 2120 12/15/15 0530 12/16/15 0515  WBC 10.6* 7.6 13.2*  HGB 13.2 12.8 11.8*  HCT 41.9 41.4 38.5  MCV 93.7 93.9 94.1  PLT 239 201 189   Basic Metabolic Panel:  Recent Labs Lab 12/13/15 2120 12/15/15 0530 12/16/15 0515  NA 140 138 136  K 4.4 5.0 5.1  CL 102 97* 97*  CO2 29 33* 30  GLUCOSE 120* 176* 156*  BUN 27* 29* 44*  CREATININE 1.26* 1.42* 1.74*  CALCIUM 8.8* 8.5* 8.2*   GFR: Estimated Creatinine Clearance: 65 mL/min (by C-G formula based on SCr of 1.74 mg/dL (H)). Liver Function Tests: No results for input(s): AST, ALT, ALKPHOS, BILITOT, PROT, ALBUMIN in the last 168 hours. No results for input(s): LIPASE, AMYLASE in the last 168 hours. No results for input(s): AMMONIA in the last 168 hours. Coagulation Profile:  Recent Labs Lab 12/14/15 0303  INR 0.98   Cardiac  Enzymes:  Recent Labs Lab 12/14/15 0303 12/14/15 0757 12/14/15 1613  TROPONINI <0.03 <0.03 <0.03   BNP (last 3 results) No results for input(s): PROBNP in the last 8760 hours. HbA1C:  Recent Labs  12/14/15 0303  HGBA1C 5.5   CBG: No results for input(s): GLUCAP in the last 168 hours. Lipid Profile:  Recent Labs  12/14/15 0303  CHOL 186  HDL 38*  LDLCALC 122*  TRIG 129  CHOLHDL 4.9   Thyroid Function Tests: No results for input(s): TSH, T4TOTAL, FREET4, T3FREE, THYROIDAB in the last 72 hours. Anemia Panel: No results for input(s): VITAMINB12, FOLATE, FERRITIN, TIBC, IRON, RETICCTPCT in the last 72 hours. Urine analysis:    Component Value Date/Time   COLORURINE YELLOW 12/01/2014 2240   APPEARANCEUR CLEAR 12/01/2014 2240   LABSPEC >1.030 (H) 12/01/2014 2240   PHURINE 5.0 12/01/2014 2240   GLUCOSEU NEGATIVE 12/01/2014 2240   GLUCOSEU NEG mg/dL 16/12/9602 5409   HGBUR NEGATIVE 12/01/2014 2240   HGBUR negative 12/05/2009 1003   BILIRUBINUR NEGATIVE 12/01/2014 2240   BILIRUBINUR small 07/31/2010 1713  KETONESUR NEGATIVE 12/01/2014 2240   PROTEINUR NEGATIVE 12/01/2014 2240   UROBILINOGEN 0.2 12/01/2014 2240   NITRITE NEGATIVE 12/01/2014 2240   LEUKOCYTESUR NEGATIVE 12/01/2014 2240   Sepsis Labs: @LABRCNTIP (procalcitonin:4,lacticidven:4)  ) Recent Results (from the past 240 hour(s))  Respiratory Panel by PCR     Status: None   Collection Time: 12/14/15  6:11 AM  Result Value Ref Range Status   Adenovirus NOT DETECTED NOT DETECTED Final   Coronavirus 229E NOT DETECTED NOT DETECTED Final   Coronavirus HKU1 NOT DETECTED NOT DETECTED Final   Coronavirus NL63 NOT DETECTED NOT DETECTED Final   Coronavirus OC43 NOT DETECTED NOT DETECTED Final   Metapneumovirus NOT DETECTED NOT DETECTED Final   Rhinovirus / Enterovirus NOT DETECTED NOT DETECTED Final   Influenza A NOT DETECTED NOT DETECTED Final   Influenza B NOT DETECTED NOT DETECTED Final   Parainfluenza  Virus 1 NOT DETECTED NOT DETECTED Final   Parainfluenza Virus 2 NOT DETECTED NOT DETECTED Final   Parainfluenza Virus 3 NOT DETECTED NOT DETECTED Final   Parainfluenza Virus 4 NOT DETECTED NOT DETECTED Final   Respiratory Syncytial Virus NOT DETECTED NOT DETECTED Final   Bordetella pertussis NOT DETECTED NOT DETECTED Final   Chlamydophila pneumoniae NOT DETECTED NOT DETECTED Final   Mycoplasma pneumoniae NOT DETECTED NOT DETECTED Final    Comment: Performed at [facility]  MRSA PCR Screening     Status: Abnormal   Collection Time: 12/14/15  6:11 AM  Result Value Ref Range Status   MRSA by PCR POSITIVE (A) NEGATIVE Final    Comment:        The GeneXpert MRSA Assay (FDA approved for NASAL specimens only), is one component of a comprehensive MRSA colonization surveillance program. It is not intended to diagnose MRSA infection nor to guide or monitor treatment for MRSA infections. RESULT CALLED TO, READ BACK BY AND VERIFIED WITH: Shari Heritage, RN AT 11:53 12/14/15 BY N. THOMPSON       Radiology Studies: Nm Pulmonary Perf And Vent  Result Date: 12/14/2015 CLINICAL DATA:  Shortness of breath and decreased oxygen saturation EXAM: NUCLEAR MEDICINE VENTILATION - PERFUSION LUNG SCAN Views: Anterior, posterior, LPO, RPO, LAO, RAO -ventilation and perfusion. Patient could not tolerate lateral imaging. RADIOPHARMACEUTICALS:  29.7 mCi Technetium-51m DTPA aerosol inhalation and 4.17 mCi Technetium-34m MAA IV COMPARISON:  Chest radiograph December 13, 2015 FINDINGS: Ventilation: Radiotracer uptake bilaterally is homogeneous and symmetric bilaterally. No focal ventilation defects are evident. Perfusion: Radiotracer uptake bilaterally is homogeneous and symmetric bilaterally. No focal perfusion defects are evident. There is no appreciable ventilation/perfusion mismatch. IMPRESSION: No ventilation or perfusion defects evident. Very low probability of pulmonary embolus. Electronically Signed   By:  Bretta Bang III M.D.   On: 12/14/2015 16:13     Scheduled Meds: . allopurinol  100 mg Oral q morning - 10a  . aspirin  325 mg Oral q morning - 10a  . atorvastatin  20 mg Oral q morning - 10a  . benzonatate  200 mg Oral TID  . Chlorhexidine Gluconate Cloth  6 each Topical Q0600  . darifenacin  7.5 mg Oral Daily  . dextromethorphan-guaiFENesin  1 tablet Oral BID  . escitalopram  10 mg Oral Daily  . folic acid  1 mg Oral Daily  . furosemide  40 mg Intravenous Daily  . gabapentin  100 mg Oral BID  . heparin subcutaneous  5,000 Units Subcutaneous Q8H  . indomethacin  75 mg Oral BID WC  . ipratropium-albuterol  3 mL  Nebulization TID  . isosorbide mononitrate  30 mg Oral Daily  . lubriderm seriously sensitive  1 application Topical TID  . methylPREDNISolone (SOLU-MEDROL) injection  40 mg Intravenous BID  . mupirocin ointment  1 application Nasal BID  . omega-3 acid ethyl esters  1 g Oral Daily  . pantoprazole  40 mg Oral q morning - 10a  . topiramate  100 mg Oral BID  . vitamin E  100 Units Oral Daily   Continuous Infusions:     LOS: 2 days   Time Spent in minutes   30 minutes  Kara Vincent D.O. on 12/16/2015 at 11:14 AM  Between 7am to 7pm - Pager - [phone]  After 7pm go to [email or web address] - password [email or web address]  And look for the night coverage person covering for me after hours  [facility] Office  [phone]

## 2015-12-17 LAB — POTASSIUM: POTASSIUM: 4.9 mmol/L (ref 3.5–5.1)

## 2015-12-17 LAB — BASIC METABOLIC PANEL
ANION GAP: 7 (ref 5–15)
BUN: 57 mg/dL — ABNORMAL HIGH (ref 6–20)
CHLORIDE: 99 mmol/L — AB (ref 101–111)
CO2: 33 mmol/L — ABNORMAL HIGH (ref 22–32)
Calcium: 8 mg/dL — ABNORMAL LOW (ref 8.9–10.3)
Creatinine, Ser: 1.76 mg/dL — ABNORMAL HIGH (ref 0.44–1.00)
GFR calc non Af Amer: 32 mL/min — ABNORMAL LOW (ref >60)
GFR, EST AFRICAN AMERICAN: 38 mL/min — AB (ref >60)
Glucose, Bld: 129 mg/dL — ABNORMAL HIGH (ref 65–99)
POTASSIUM: 5.3 mmol/L — AB (ref 3.5–5.1)
SODIUM: 139 mmol/L (ref 135–145)

## 2015-12-17 MED ORDER — FUROSEMIDE 10 MG/ML IJ SOLN
40.0000 mg | Freq: Once | INTRAMUSCULAR | Status: AC
  Administered 2015-12-17: 40 mg via INTRAVENOUS
  Filled 2015-12-17: qty 4

## 2015-12-17 MED ORDER — PREDNISONE 20 MG PO TABS
40.0000 mg | ORAL_TABLET | Freq: Every day | ORAL | Status: DC
  Administered 2015-12-18 – 2015-12-19 (×2): 40 mg via ORAL
  Filled 2015-12-17 (×2): qty 2

## 2015-12-17 NOTE — Progress Notes (Signed)
I resumed care of this patient at 1500.  I agree with the previous nurses assessment.

## 2015-12-17 NOTE — Plan of Care (Signed)
Problem: Activity: Goal: Risk for activity intolerance will decrease Outcome: Completed/Met Date Met: 12/17/15 Pt has been out of bed independently. Pt  walked with PT with walker. Gait steady

## 2015-12-17 NOTE — Progress Notes (Signed)
PROGRESS NOTE    Kara Vincent  NUU:[id number] DOB: March 17, 1966 DOA: 12/13/2015 PCP: No PCP Per Patient   Chief Complaint  Patient presents with  . Shortness of Breath  . Chest Pain    Brief Narrative:  HPI on 12/14/2015 by Dr. Lorretta Harp Kara Vincent is a 50 y.o. female with medical history significant of hypertension, hyperlipidemia, asthma, OSA on CPAP, morbid obesity, right heart failure, diastolic congestive heart failure, drug abuse, Darer's disease, wheelchair-bound, CKD-III, GERD, gout, anxiety, who presents with shortness of breath, chest pain, leg edema, cough.  Patient states that she has been having shortness of breath, chest pain, cough for more than one week. Her chest pain is located in left lower chest. It is constant, 8 out of 10 in severity, nonradiating, pleuritic, aggravated by coughing and deep breath. He coughs up yellow colored sputum. No fever or chills. He has bilateral lower leg pain, but it not localized to calf areas. He has bilateral lower leg edema. Denies nausea, vomiting, abdominal pain, diarrhea, symptoms of UTI. She is wheelchair bounded, no unilateral weakness, vision change or hearing loss.  Assessment & Plan   Acute on chronic respiratory failure with hypoxia/Cough -Likely multifactorial including CHF vs asthma exacerbation -BNP 45, however chest x-ray does show pulmonary edema and vascular congestion -On admission, patient did have significant wheezing as well as coughing -Appears to be improving, however, patient claims to be short of breath -VQ scan showed low probability of pulmonary embolus. LE doppler showed no evidence of DVT or SVT -Changed to DVT prophylactic dose of heparin -Continue antitussives -Repeat CXR appears stable  Asthma exacerbation -Continue nebs, Solu-Medrol, Mucinex -Improving -Chest x-ray showed vascular congestion with cardiomegaly, small left pleural effusion, possible pulmonary edema -Respiratory viral panel  unremarkable -Will wean to oral prednisone, 40mg  PO daily  Acute on chronic diastolic heart failure -Echocardiogram: EF 60-65%, grade 1 diastolic dysfunction -Monitor intake and output, daily weights -BNP 45 -Patient was given Lasix 80 mg IV in the emergency department -Continue lasix  Essential hypertension -BP soft to normal -Will hold clonidine and lisinopril -Continue lasix  Chronic kidney disease, stage III -Baseline creatinine approximately 1.2-1.3, currently 1.76 -Continue to monitor BMP  Obstructive sleep apnea -Continue CPAP QHS -Patient stated she lost or displaced her CPAP back in 2013.  -Spoke to case management regarding possibly obtaining another CPAP vs outpatient sleep study  GERD -Continue PPI  Gout -Continue allopurinol and indomethacin  Hyperlipidemia -Continue Lipitor  Chest pain -Likely pleuritic -Troponin unremarkable -Echocardiogram as above -Continue aspirin, statin, Imdur   Depression/anxiety -Continue Lexapro, Xanax PRN  Headache -States she takes pain medicine for this -Would like a scan of her head.  Patient has no neurological deficits. -Defer to outpatient workup and follow up -Continue pain control -CT head unremarkable, normal  DVT Prophylaxis  heparin   Code Status: Full  Family Communication: None at bedside  Disposition Plan: Admitted. Will likely discharge home on 9/26  Consultants None  Procedures  Echocardiogram Lower extremity Doppler VQ scan  Antibiotics   Anti-infectives    None      Subjective:   Kara Vincent seen and examined today. Continues to complain of some shortness of breath, pain and cough.   Denies abdominal pain, nausea, vomiting, diarrhea, constipation, dizziness.    Per RN, patient was able to get out of bed and walk without being short of breath.  Objective:   Vitals:   12/16/15 1857 12/16/15 2223 12/17/15 0608 12/17/15 0933  BP: [name])  102/55 (!) 115/57 108/64   Pulse: 63 61 (!)  50 65  Resp:  16 16 18   Temp:  98.3 F (36.8 C) 98.3 F (36.8 C)   TempSrc:  Oral Oral   SpO2:  93% 94% 94%  Weight:   (!) 186.4 kg (411 lb)   Height:        Intake/Output Summary (Last 24 hours) at 12/17/15 1320 Last data filed at 12/17/15 1030  Gross per 24 hour  Intake              360 ml  Output             1800 ml  Net            -1440 ml   Filed Weights   12/14/15 0559 12/15/15 0445 12/17/15 0608  Weight: (!) 202 kg (445 lb 5.3 oz) (!) 183.8 kg (405 lb 1.6 oz) (!) 186.4 kg (411 lb)    Exam  General: Well developed, well nourished, chronically ill, morbidly obese, currently brushing her hair  HEENT: NCAT, mucous membranes moist.   Cardiovascular: S1 S2 auscultated, ?1/6SEM, RRR  Respiratory: Diminished breath sounds, but clear.  No wheezing noted. Appears comfortable, no increased work of breathing, able to speak clearly without stopping   Abdomen: Soft, Morbidly obese, nontender, nondistended, + bowel sounds  Extremities: warm dry without cyanosis clubbing or edema  Neuro: AAOx3, Nonfocal  Psych: Appropriate   Data Reviewed: I have personally reviewed following labs and imaging studies  CBC:  Recent Labs Lab 12/13/15 2120 12/15/15 0530 12/16/15 0515  WBC 10.6* 7.6 13.2*  HGB 13.2 12.8 11.8*  HCT 41.9 41.4 38.5  MCV 93.7 93.9 94.1  PLT 239 201 189   Basic Metabolic Panel:  Recent Labs Lab 12/13/15 2120 12/15/15 0530 12/16/15 0515 12/17/15 0508  NA 140 138 136 139  K 4.4 5.0 5.1 5.3*  CL 102 97* 97* 99*  CO2 29 33* 30 33*  GLUCOSE 120* 176* 156* 129*  BUN 27* 29* 44* 57*  CREATININE 1.26* 1.42* 1.74* 1.76*  CALCIUM 8.8* 8.5* 8.2* 8.0*   GFR: Estimated Creatinine Clearance: 65 mL/min (by C-G formula based on SCr of 1.76 mg/dL (H)). Liver Function Tests: No results for input(s): AST, ALT, ALKPHOS, BILITOT, PROT, ALBUMIN in the last 168 hours. No results for input(s): LIPASE, AMYLASE in the last 168 hours. No results for input(s):  AMMONIA in the last 168 hours. Coagulation Profile:  Recent Labs Lab 12/14/15 0303  INR 0.98   Cardiac Enzymes:  Recent Labs Lab 12/14/15 0303 12/14/15 0757 12/14/15 1613  TROPONINI <0.03 <0.03 <0.03   BNP (last 3 results) No results for input(s): PROBNP in the last 8760 hours. HbA1C: No results for input(s): HGBA1C in the last 72 hours. CBG: No results for input(s): GLUCAP in the last 168 hours. Lipid Profile: No results for input(s): CHOL, HDL, LDLCALC, TRIG, CHOLHDL, LDLDIRECT in the last 72 hours. Thyroid Function Tests: No results for input(s): TSH, T4TOTAL, FREET4, T3FREE, THYROIDAB in the last 72 hours. Anemia Panel: No results for input(s): VITAMINB12, FOLATE, FERRITIN, TIBC, IRON, RETICCTPCT in the last 72 hours. Urine analysis:    Component Value Date/Time   COLORURINE YELLOW 12/01/2014 2240   APPEARANCEUR CLEAR 12/01/2014 2240   LABSPEC >1.030 (H) 12/01/2014 2240   PHURINE 5.0 12/01/2014 2240   GLUCOSEU NEGATIVE 12/01/2014 2240   GLUCOSEU NEG mg/dL 14/78/2956 2130   HGBUR NEGATIVE 12/01/2014 2240   HGBUR negative 12/05/2009 1003   BILIRUBINUR NEGATIVE 12/01/2014  2240   BILIRUBINUR small 07/31/2010 1713   KETONESUR NEGATIVE 12/01/2014 2240   PROTEINUR NEGATIVE 12/01/2014 2240   UROBILINOGEN 0.2 12/01/2014 2240   NITRITE NEGATIVE 12/01/2014 2240   LEUKOCYTESUR NEGATIVE 12/01/2014 2240   Sepsis Labs: @LABRCNTIP (procalcitonin:4,lacticidven:4)  ) Recent Results (from the past 240 hour(s))  Respiratory Panel by PCR     Status: None   Collection Time: 12/14/15  6:11 AM  Result Value Ref Range Status   Adenovirus NOT DETECTED NOT DETECTED Final   Coronavirus 229E NOT DETECTED NOT DETECTED Final   Coronavirus HKU1 NOT DETECTED NOT DETECTED Final   Coronavirus NL63 NOT DETECTED NOT DETECTED Final   Coronavirus OC43 NOT DETECTED NOT DETECTED Final   Metapneumovirus NOT DETECTED NOT DETECTED Final   Rhinovirus / Enterovirus NOT DETECTED NOT DETECTED Final     Influenza A NOT DETECTED NOT DETECTED Final   Influenza B NOT DETECTED NOT DETECTED Final   Parainfluenza Virus 1 NOT DETECTED NOT DETECTED Final   Parainfluenza Virus 2 NOT DETECTED NOT DETECTED Final   Parainfluenza Virus 3 NOT DETECTED NOT DETECTED Final   Parainfluenza Virus 4 NOT DETECTED NOT DETECTED Final   Respiratory Syncytial Virus NOT DETECTED NOT DETECTED Final   Bordetella pertussis NOT DETECTED NOT DETECTED Final   Chlamydophila pneumoniae NOT DETECTED NOT DETECTED Final   Mycoplasma pneumoniae NOT DETECTED NOT DETECTED Final    Comment: Performed at [facility]  MRSA PCR Screening     Status: Abnormal   Collection Time: 12/14/15  6:11 AM  Result Value Ref Range Status   MRSA by PCR POSITIVE (A) NEGATIVE Final    Comment:        The GeneXpert MRSA Assay (FDA approved for NASAL specimens only), is one component of a comprehensive MRSA colonization surveillance program. It is not intended to diagnose MRSA infection nor to guide or monitor treatment for MRSA infections. RESULT CALLED TO, READ BACK BY AND VERIFIED WITH: Shari Heritage, RN AT 11:53 12/14/15 BY Rito Ehrlich       Radiology Studies: Dg Chest 2 View  Result Date: 12/16/2015 CLINICAL DATA:  Left lower chest pain. Shortness of breath. Productive cough. Bilateral leg pain. Morbid obesity. EXAM: CHEST  2 VIEW COMPARISON:  12/13/2015. FINDINGS: Stable enlarged cardiac silhouette and prominent pulmonary vasculature. A poor inspiration is again demonstrated. Grossly clear lungs. Thoracic spine degenerative changes. IMPRESSION: Stable poor inspiration, cardiomegaly and pulmonary vascular congestion. Electronically Signed   By: Beckie Salts M.D.   On: 12/16/2015 16:02   Ct Head Wo Contrast  Result Date: 12/16/2015 CLINICAL DATA:  Left-sided headache. Dizziness. Some blurred vision. EXAM: CT HEAD WITHOUT CONTRAST TECHNIQUE: Contiguous axial images were obtained from the base of the skull through the vertex  without intravenous contrast. COMPARISON:  None. FINDINGS: Brain: No evidence of acute infarction, hemorrhage, hydrocephalus, extra-axial collection or mass lesion/mass effect. Vascular: No hyperdense vessel or unexpected calcification. Skull: Normal. Negative for fracture or focal lesion. Sinuses/Orbits: No acute finding. Other: None. IMPRESSION: Normal examination. Electronically Signed   By: Beckie Salts M.D.   On: 12/16/2015 16:00     Scheduled Meds: . allopurinol  100 mg Oral q morning - 10a  . aspirin  325 mg Oral q morning - 10a  . atorvastatin  20 mg Oral q morning - 10a  . benzonatate  200 mg Oral TID  . Chlorhexidine Gluconate Cloth  6 each Topical Q0600  . darifenacin  7.5 mg Oral Daily  . dextromethorphan-guaiFENesin  1 tablet Oral BID  .  escitalopram  10 mg Oral Daily  . folic acid  1 mg Oral Daily  . gabapentin  100 mg Oral BID  . heparin subcutaneous  5,000 Units Subcutaneous Q8H  . indomethacin  75 mg Oral BID WC  . ipratropium-albuterol  3 mL Nebulization TID  . isosorbide mononitrate  30 mg Oral Daily  . lubriderm seriously sensitive  1 application Topical TID  . mupirocin ointment  1 application Nasal BID  . omega-3 acid ethyl esters  1 g Oral Daily  . pantoprazole  40 mg Oral q morning - 10a  . [START ON 12/18/2015] predniSONE  40 mg Oral Q breakfast  . topiramate  100 mg Oral BID  . vitamin E  100 Units Oral Daily   Continuous Infusions:     LOS: 3 days   Time Spent in minutes   30 minutes  Shyteria Lewis D.O. on 12/17/2015 at 1:20 PM  Between 7am to 7pm - Pager - [phone]  After 7pm go to [email or web address] - password [email or web address]  And look for the night coverage person covering for me after hours  [facility] Office  [phone]

## 2015-12-17 NOTE — Progress Notes (Signed)
Physical Therapy Treatment Patient Details Name: JANAISA BIRKLAND MRN: [id number] DOB: 1965-10-13 Today's Date: 01/06/16    History of Present Illness 50 yo female admitted with SOB respiratory failure with hypoxia. pt noted to have Asthma exacerbation and acute on chronic CHF. PMH: CHF, chronic pain, OSA, morbid obesity    PT Comments    Pt ambulated in hallway and reports dyspnea however improved quickly with rest.  Pt states gait distance is a little better today then her baseline.  Follow Up Recommendations  No PT follow up     Equipment Recommendations  Rolling walker with 5" wheels (bariatric)    Recommendations for Other Services       Precautions / Restrictions Precautions Precautions: Fall Precaution Comments: chronic 3L O2    Mobility  Bed Mobility Overal bed mobility: Modified Independent                Transfers Overall transfer level: Needs assistance Equipment used: Rolling walker (2 wheeled) Transfers: Sit to/from [facility] Sit to Stand: Supervision Stand pivot transfers: Supervision       General transfer comment: pt transferred from bed to [facility], supervision for safety/lines, uses wide BOS  Ambulation/Gait Ambulation/Gait assistance: Min guard Ambulation Distance (Feet): 120 Feet Assistive device: Rolling walker (2 wheeled) Gait Pattern/deviations: Step-through pattern;Wide base of support;Decreased stride length;Trunk flexed Gait velocity: decr   General Gait Details: appears to rely heavily on bil UE on RW at times, distance to tolerance, remained on 3L O2 Power and SPO2 97% upon return to room   Stairs            Wheelchair Mobility    Modified Rankin (Stroke Patients Only)       Balance                                    Cognition Arousal/Alertness: Awake/alert Behavior During Therapy: WFL for tasks assessed/performed Overall Cognitive Status: Within Functional Limits for tasks assessed                      Exercises      General Comments        Pertinent Vitals/Pain Pain Assessment: No/denies pain (no c/o pain during mobility) Pain Intervention(s): Repositioned    Home Living                      Prior Function            PT Goals (current goals can now be found in the care plan section) Progress towards PT goals: Progressing toward goals    Frequency    Min 3X/week      PT Plan Current plan remains appropriate    Co-evaluation             End of Session Equipment Utilized During Treatment: Oxygen Activity Tolerance: Patient limited by fatigue Patient left: with call bell/phone within reach;in bed     Time: 7169-6789 PT Time Calculation (min) (ACUTE ONLY): 21 min  Charges:  $Gait Training: 8-22 mins                    G Codes:      Amaad Byers,KATHrine E 01/06/16, 1:27 PM Carmelia Bake, PT, DPT Jan 06, 2016 Pager: [phone]

## 2015-12-18 DIAGNOSIS — N179 Acute kidney failure, unspecified: Secondary | ICD-10-CM

## 2015-12-18 LAB — BASIC METABOLIC PANEL
ANION GAP: 7 (ref 5–15)
BUN: 62 mg/dL — ABNORMAL HIGH (ref 6–20)
CHLORIDE: 97 mmol/L — AB (ref 101–111)
CO2: 35 mmol/L — AB (ref 22–32)
Calcium: 7.8 mg/dL — ABNORMAL LOW (ref 8.9–10.3)
Creatinine, Ser: 1.93 mg/dL — ABNORMAL HIGH (ref 0.44–1.00)
GFR calc non Af Amer: 29 mL/min — ABNORMAL LOW (ref >60)
GFR, EST AFRICAN AMERICAN: 34 mL/min — AB (ref >60)
Glucose, Bld: 113 mg/dL — ABNORMAL HIGH (ref 65–99)
POTASSIUM: 4.6 mmol/L (ref 3.5–5.1)
Sodium: 139 mmol/L (ref 135–145)

## 2015-12-18 MED ORDER — SODIUM CHLORIDE 0.9 % IV SOLN
INTRAVENOUS | Status: DC
  Administered 2015-12-18 – 2015-12-19 (×2): via INTRAVENOUS

## 2015-12-18 NOTE — Progress Notes (Signed)
Pt more alert and participating in her care after being on the CPAP machine for a few hours. Will continue to monitor.

## 2015-12-18 NOTE — Progress Notes (Signed)
Pt has Dr. York Ram as PCP/  Pt states that he will no longer take Medicaid pt's. Pt will need to go back to Social Service to have name changed on her card. List of Medicaid PCP providers given to pt. There are no appointment at [facility] or [place].  Pt will need to call for an appointment.

## 2015-12-18 NOTE — Progress Notes (Signed)
PROGRESS NOTE    Kara Vincent  ONG:[id number] DOB: [birth year] DOA: 12/13/2015 PCP: No PCP Per Patient   Chief Complaint  Patient presents with  . Shortness of Breath  . Chest Pain    Brief Narrative:  HPI on 12/14/2015 by Dr. Lorretta Harp Kara Vincent is a 50 y.o. female with medical history significant of hypertension, hyperlipidemia, asthma, OSA on CPAP, morbid obesity, right heart failure, diastolic congestive heart failure, drug abuse, Darer's disease, wheelchair-bound, CKD-III, GERD, gout, anxiety, who presents with shortness of breath, chest pain, leg edema, cough.  Patient states that she has been having shortness of breath, chest pain, cough for more than one week. Her chest pain is located in left lower chest. It is constant, 8 out of 10 in severity, nonradiating, pleuritic, aggravated by coughing and deep breath. He coughs up yellow colored sputum. No fever or chills. He has bilateral lower leg pain, but it not localized to calf areas. He has bilateral lower leg edema. Denies nausea, vomiting, abdominal pain, diarrhea, symptoms of UTI. She is wheelchair bounded, no unilateral weakness, vision change or hearing loss.  Interim history Treated asthma exacerbation. Was receiving IV lasix, developed AKI. Placed on gentle IVF.   Assessment & Plan   Acute on chronic respiratory failure with hypoxia/Cough -Likely multifactorial including CHF vs asthma exacerbation -BNP 45, however chest x-ray does show pulmonary edema and vascular congestion -On admission, patient did have significant wheezing as well as coughing -Appears to be improving, however, patient claims to be short of breath -VQ scan showed low probability of pulmonary embolus. LE doppler showed no evidence of DVT or SVT -Changed to DVT prophylactic dose of heparin -Continue antitussives -Repeat CXR appears stable  Asthma exacerbation -Continue nebs, Solu-Medrol, Mucinex -Improving -Chest x-ray showed vascular  congestion with cardiomegaly, small left pleural effusion, possible pulmonary edema -Respiratory viral panel unremarkable -Transitioned to oral prednisone  Acute on chronic diastolic heart failure -Echocardiogram: EF 60-65%, grade 1 diastolic dysfunction -Monitor intake and output, daily weights -BNP 45 -Patient was given Lasix 80 mg IV in the emergency department -Stopped lasix as patient developed AKI  Essential hypertension -BP soft to normal -Will hold clonidine and lisinopril -Continue lasix  Acute on Chronic kidney disease, stage III -Baseline creatinine approximately 1.2-1.3, currently 1.93 -Placed on gentle IVF -Continue to monitor BMP  Obstructive sleep apnea -Continue CPAP QHS -Patient stated she lost or displaced her CPAP back in 2013.  -Spoke to case management regarding possibly obtaining another CPAP vs outpatient sleep study  GERD -Continue PPI  Gout -Continue allopurinol and indomethacin  Hyperlipidemia -Continue Lipitor  Chest pain -Likely pleuritic -Troponin unremarkable -Echocardiogram as above -Continue aspirin, statin, Imdur   Depression/anxiety -Continue Lexapro, Xanax PRN  Headache -States she takes pain medicine for this -Would like a scan of her head.  Patient has no neurological deficits. -Defer to outpatient workup and follow up -Continue pain control -CT head unremarkable, normal  DVT Prophylaxis  heparin   Code Status: Full  Family Communication: None at bedside  Disposition Plan: Admitted. Developed AKI, give gentle IVF. If creatinine improves, likely dc in 24 hours  Consultants None  Procedures  Echocardiogram Lower extremity Doppler VQ scan  Antibiotics   Anti-infectives    None      Subjective:   Kara Vincent seen and examined today. Continues to complain of not feeling well and having shortness of breath and cough, as well as pain in her side.  Denies abdominal pain, nausea, vomiting, dizziness.  Objective:   Vitals:   12/17/15 1354 12/17/15 1500 12/17/15 2118 12/18/15 0601  BP:  (!) 111/59 129/62 (!) 108/52  Pulse: 64 62 76 70  Resp: 16 18 18 18   Temp:  98.2 F (36.8 C) 98.4 F (36.9 C) 98.5 F (36.9 C)  TempSrc:  Oral Oral Oral  SpO2: 94% 94% 96% 97%  Weight:      Height:        Intake/Output Summary (Last 24 hours) at 12/18/15 1325 Last data filed at 12/18/15 0900  Gross per 24 hour  Intake              840 ml  Output             1800 ml  Net             -960 ml   Filed Weights   12/14/15 0559 12/15/15 0445 12/17/15 0608  Weight: (!) 202 kg (445 lb 5.3 oz) (!) 183.8 kg (405 lb 1.6 oz) (!) 186.4 kg (411 lb)    Exam  General: Well developed, well nourished, chronically ill, morbidly obese  HEENT: NCAT, mucous membranes moist.   Cardiovascular: S1 S2 auscultated, ?1/6SEM, RRR  Respiratory: Diminished breath sounds, but clear.  Appears comfortable, no increased work of breathing, able to speak clearly without stopping, no coughing  Abdomen: Soft, Morbidly obese, nontender, nondistended, + bowel sounds  Extremities: warm dry without cyanosis clubbing or edema  Neuro: AAOx3, Nonfocal    Data Reviewed: I have personally reviewed following labs and imaging studies  CBC:  Recent Labs Lab 12/13/15 2120 12/15/15 0530 12/16/15 0515  WBC 10.6* 7.6 13.2*  HGB 13.2 12.8 11.8*  HCT 41.9 41.4 38.5  MCV 93.7 93.9 94.1  PLT 239 201 189   Basic Metabolic Panel:  Recent Labs Lab 12/13/15 2120 12/15/15 0530 12/16/15 0515 12/17/15 0508 12/17/15 1308 12/18/15 0506  NA 140 138 136 139  --  139  K 4.4 5.0 5.1 5.3* 4.9 4.6  CL 102 97* 97* 99*  --  97*  CO2 29 33* 30 33*  --  35*  GLUCOSE 120* 176* 156* 129*  --  113*  BUN 27* 29* 44* 57*  --  62*  CREATININE 1.26* 1.42* 1.74* 1.76*  --  1.93*  CALCIUM 8.8* 8.5* 8.2* 8.0*  --  7.8*   GFR: Estimated Creatinine Clearance: 59.2 mL/min (by C-G formula based on SCr of 1.93 mg/dL (H)). Liver Function  Tests: No results for input(s): AST, ALT, ALKPHOS, BILITOT, PROT, ALBUMIN in the last 168 hours. No results for input(s): LIPASE, AMYLASE in the last 168 hours. No results for input(s): AMMONIA in the last 168 hours. Coagulation Profile:  Recent Labs Lab 12/14/15 0303  INR 0.98   Cardiac Enzymes:  Recent Labs Lab 12/14/15 0303 12/14/15 0757 12/14/15 1613  TROPONINI <0.03 <0.03 <0.03   BNP (last 3 results) No results for input(s): PROBNP in the last 8760 hours. HbA1C: No results for input(s): HGBA1C in the last 72 hours. CBG: No results for input(s): GLUCAP in the last 168 hours. Lipid Profile: No results for input(s): CHOL, HDL, LDLCALC, TRIG, CHOLHDL, LDLDIRECT in the last 72 hours. Thyroid Function Tests: No results for input(s): TSH, T4TOTAL, FREET4, T3FREE, THYROIDAB in the last 72 hours. Anemia Panel: No results for input(s): VITAMINB12, FOLATE, FERRITIN, TIBC, IRON, RETICCTPCT in the last 72 hours. Urine analysis:    Component Value Date/Time   COLORURINE YELLOW 12/01/2014 2240   APPEARANCEUR CLEAR 12/01/2014 2240  LABSPEC >1.030 (H) 12/01/2014 2240   PHURINE 5.0 12/01/2014 2240   GLUCOSEU NEGATIVE 12/01/2014 2240   GLUCOSEU NEG mg/dL 40/98/1191 4782   HGBUR NEGATIVE 12/01/2014 2240   HGBUR negative 12/05/2009 1003   BILIRUBINUR NEGATIVE 12/01/2014 2240   BILIRUBINUR small 07/31/2010 1713   KETONESUR NEGATIVE 12/01/2014 2240   PROTEINUR NEGATIVE 12/01/2014 2240   UROBILINOGEN 0.2 12/01/2014 2240   NITRITE NEGATIVE 12/01/2014 2240   LEUKOCYTESUR NEGATIVE 12/01/2014 2240   Sepsis Labs: @LABRCNTIP (procalcitonin:4,lacticidven:4)  ) Recent Results (from the past 240 hour(s))  Respiratory Panel by PCR     Status: None   Collection Time: 12/14/15  6:11 AM  Result Value Ref Range Status   Adenovirus NOT DETECTED NOT DETECTED Final   Coronavirus 229E NOT DETECTED NOT DETECTED Final   Coronavirus HKU1 NOT DETECTED NOT DETECTED Final   Coronavirus NL63 NOT  DETECTED NOT DETECTED Final   Coronavirus OC43 NOT DETECTED NOT DETECTED Final   Metapneumovirus NOT DETECTED NOT DETECTED Final   Rhinovirus / Enterovirus NOT DETECTED NOT DETECTED Final   Influenza A NOT DETECTED NOT DETECTED Final   Influenza B NOT DETECTED NOT DETECTED Final   Parainfluenza Virus 1 NOT DETECTED NOT DETECTED Final   Parainfluenza Virus 2 NOT DETECTED NOT DETECTED Final   Parainfluenza Virus 3 NOT DETECTED NOT DETECTED Final   Parainfluenza Virus 4 NOT DETECTED NOT DETECTED Final   Respiratory Syncytial Virus NOT DETECTED NOT DETECTED Final   Bordetella pertussis NOT DETECTED NOT DETECTED Final   Chlamydophila pneumoniae NOT DETECTED NOT DETECTED Final   Mycoplasma pneumoniae NOT DETECTED NOT DETECTED Final    Comment: Performed at [facility]  MRSA PCR Screening     Status: Abnormal   Collection Time: 12/14/15  6:11 AM  Result Value Ref Range Status   MRSA by PCR POSITIVE (A) NEGATIVE Final    Comment:        The GeneXpert MRSA Assay (FDA approved for NASAL specimens only), is one component of a comprehensive MRSA colonization surveillance program. It is not intended to diagnose MRSA infection nor to guide or monitor treatment for MRSA infections. RESULT CALLED TO, READ BACK BY AND VERIFIED WITH: Shari Heritage, RN AT 11:53 12/14/15 BY Rito Ehrlich       Radiology Studies: Dg Chest 2 View  Result Date: 12/16/2015 CLINICAL DATA:  Left lower chest pain. Shortness of breath. Productive cough. Bilateral leg pain. Morbid obesity. EXAM: CHEST  2 VIEW COMPARISON:  12/13/2015. FINDINGS: Stable enlarged cardiac silhouette and prominent pulmonary vasculature. A poor inspiration is again demonstrated. Grossly clear lungs. Thoracic spine degenerative changes. IMPRESSION: Stable poor inspiration, cardiomegaly and pulmonary vascular congestion. Electronically Signed   By: Beckie Salts M.D.   On: 12/16/2015 16:02   Ct Head Wo Contrast  Result Date: 12/16/2015 CLINICAL  DATA:  Left-sided headache. Dizziness. Some blurred vision. EXAM: CT HEAD WITHOUT CONTRAST TECHNIQUE: Contiguous axial images were obtained from the base of the skull through the vertex without intravenous contrast. COMPARISON:  None. FINDINGS: Brain: No evidence of acute infarction, hemorrhage, hydrocephalus, extra-axial collection or mass lesion/mass effect. Vascular: No hyperdense vessel or unexpected calcification. Skull: Normal. Negative for fracture or focal lesion. Sinuses/Orbits: No acute finding. Other: None. IMPRESSION: Normal examination. Electronically Signed   By: Beckie Salts M.D.   On: 12/16/2015 16:00     Scheduled Meds: . allopurinol  100 mg Oral q morning - 10a  . aspirin  325 mg Oral q morning - 10a  . atorvastatin  20  mg Oral q morning - 10a  . benzonatate  200 mg Oral TID  . Chlorhexidine Gluconate Cloth  6 each Topical Q0600  . darifenacin  7.5 mg Oral Daily  . dextromethorphan-guaiFENesin  1 tablet Oral BID  . escitalopram  10 mg Oral Daily  . folic acid  1 mg Oral Daily  . gabapentin  100 mg Oral BID  . heparin subcutaneous  5,000 Units Subcutaneous Q8H  . indomethacin  75 mg Oral BID WC  . ipratropium-albuterol  3 mL Nebulization TID  . isosorbide mononitrate  30 mg Oral Daily  . lubriderm seriously sensitive  1 application Topical TID  . mupirocin ointment  1 application Nasal BID  . omega-3 acid ethyl esters  1 g Oral Daily  . pantoprazole  40 mg Oral q morning - 10a  . predniSONE  40 mg Oral Q breakfast  . topiramate  100 mg Oral BID  . vitamin E  100 Units Oral Daily   Continuous Infusions: . sodium chloride 50 mL/hr at 12/18/15 1114     LOS: 4 days   Time Spent in minutes   30 minutes  Cillian Gwinner D.O. on 12/18/2015 at 1:25 PM  Between 7am to 7pm - Pager - [phone]  After 7pm go to [email or web address] - password [email or web address]  And look for the night coverage person covering for me after hours  [facility] Office  [phone]

## 2015-12-18 NOTE — Progress Notes (Signed)
Occupational Therapy Treatment Patient Details Name: NEVEYAH THEILEN MRN: [id number] DOB: 02-08-1966 Today's Date: 12/18/2015    History of present illness 50 yo female admitted with SOB respiratory failure with hypoxia. pt noted to have Asthma exacerbation and acute on chronic CHF. PMH: CHF, chronic pain, OSA, morbid obesity   OT comments  Progressing towards OT goals. Continue OT per plan of care.  Follow Up Recommendations  No OT follow up    Equipment Recommendations  3 in 1 bedside comode;Tub/shower seat (needs wide 3 in 1)    Recommendations for Other Services      Precautions / Restrictions Precautions Precautions: Fall Precaution Comments: chronic 3L O2 Restrictions Weight Bearing Restrictions: No       Mobility Bed Mobility Overal bed mobility: Modified Independent                Transfers Overall transfer level: Needs assistance Equipment used: Rolling walker (2 wheeled) Transfers: Sit to/from [employer] Sit to Stand: Supervision Stand pivot transfers: Supervision       General transfer comment: pt transferred from bed to [facility], supervision for safety/lines, uses wide BOS    Balance                                   ADL Overall ADL's : Needs assistance/impaired Eating/Feeding: Independent;Sitting   Grooming: Wash/dry hands;Set up;Sitting                   Toilet Transfer: Stand-pivot;BSC;Supervision/safety   Toileting- Clothing Manipulation and Hygiene: Minimal assistance;Sit to/from stand       Functional mobility during ADLs: Supervision/safety        Vision                     Perception     Praxis      Cognition   Behavior During Therapy: [facility] for tasks assessed/performed Overall Cognitive Status: Within Functional Limits for tasks assessed                       Extremity/Trunk Assessment               Exercises     Shoulder Instructions       General  Comments      Pertinent Vitals/ Pain       Pain Assessment: No/denies pain  Home Living                                          Prior Functioning/Environment              Frequency  Min 2X/week        Progress Toward Goals  OT Goals(current goals can now be found in the care plan section)  Progress towards OT goals: Progressing toward goals  Acute Rehab OT Goals Patient Stated Goal: none stated by patient  Plan Discharge plan remains appropriate    Co-evaluation                 End of Session     Activity Tolerance Patient tolerated treatment well   Patient Left in bed;with call bell/phone within reach   Nurse Communication          Time: 2952-8413 OT Time Calculation (min): 9 min  Charges: OT  General Charges $OT Visit: 1 Procedure OT Treatments $Self Care/Home Management : 8-22 mins  Joy Reiger A 12/18/2015, 9:07 AM

## 2015-12-18 NOTE — Progress Notes (Signed)
Rt placed pt on her CPAP machine with 3 LPM bleed in. Pt seem very sleepy. Pt can tell date, year and month. RN at bedside.

## 2015-12-19 ENCOUNTER — Inpatient Hospital Stay (HOSPITAL_COMMUNITY): Payer: Medicaid Other

## 2015-12-19 LAB — BASIC METABOLIC PANEL
ANION GAP: 6 (ref 5–15)
BUN: 52 mg/dL — AB (ref 6–20)
CALCIUM: 7.9 mg/dL — AB (ref 8.9–10.3)
CO2: 35 mmol/L — AB (ref 22–32)
Chloride: 100 mmol/L — ABNORMAL LOW (ref 101–111)
Creatinine, Ser: 1.4 mg/dL — ABNORMAL HIGH (ref 0.44–1.00)
GFR calc Af Amer: 49 mL/min — ABNORMAL LOW (ref >60)
GFR calc non Af Amer: 43 mL/min — ABNORMAL LOW (ref >60)
GLUCOSE: 101 mg/dL — AB (ref 65–99)
POTASSIUM: 5.5 mmol/L — AB (ref 3.5–5.1)
Sodium: 141 mmol/L (ref 135–145)

## 2015-12-19 MED ORDER — FUROSEMIDE 20 MG PO TABS
20.0000 mg | ORAL_TABLET | Freq: Every day | ORAL | Status: DC
  Administered 2015-12-19 – 2015-12-24 (×6): 20 mg via ORAL
  Filled 2015-12-19 (×6): qty 1

## 2015-12-19 MED ORDER — PREDNISONE 20 MG PO TABS
20.0000 mg | ORAL_TABLET | Freq: Every day | ORAL | Status: DC
  Administered 2015-12-20 – 2015-12-24 (×5): 20 mg via ORAL
  Filled 2015-12-19 (×5): qty 1

## 2015-12-19 MED ORDER — SODIUM POLYSTYRENE SULFONATE 15 GM/60ML PO SUSP
30.0000 g | Freq: Once | ORAL | Status: AC
  Administered 2015-12-19: 30 g via ORAL
  Filled 2015-12-19: qty 120

## 2015-12-19 NOTE — Progress Notes (Signed)
PROGRESS NOTE    Kara Vincent  QMV:[id number] DOB: 1965-04-19 DOA: 12/13/2015 PCP: No PCP Per Patient   Chief Complaint  Patient presents with  . Shortness of Breath  . Chest Pain    Brief Narrative:  HPI on 12/14/2015 by Dr. Lorretta Harp Kara Vincent is a 50 y.o. female with medical history significant of hypertension, hyperlipidemia, asthma, OSA on CPAP, morbid obesity, right heart failure, diastolic congestive heart failure, drug abuse, Darer's disease, wheelchair-bound, CKD-III, GERD, gout, anxiety, who presents with shortness of breath, chest pain, leg edema, cough.  Patient states that she has been having shortness of breath, chest pain, cough for more than one week. Her chest pain is located in left lower chest. It is constant, 8 out of 10 in severity, nonradiating, pleuritic, aggravated by coughing and deep breath. He coughs up yellow colored sputum. No fever or chills. He has bilateral lower leg pain, but it not localized to calf areas. He has bilateral lower leg edema. Denies nausea, vomiting, abdominal pain, diarrhea, symptoms of UTI. She is wheelchair bounded, no unilateral weakness, vision change or hearing loss.     Assessment & Plan   Acute on chronic respiratory failure with hypoxia/Cough -Likely multifactorial including CHF vs asthma exacerbation -BNP 45, however chest x-ray does show pulmonary edema and vascular congestion -On admission, patient did have significant wheezing as well as coughing -Appears to be improving, TODAY  however, patient claims to be short of breath and some chest tightness. -VQ scan showed low probability of pulmonary embolus. LE doppler showed no evidence of DVT or SVT -Changed to DVT prophylactic dose of heparin -Continue antitussives -Repeat CXR appears stable  Asthma exacerbation  -Improving, transitioned to oral steroids and continue with duo nebs.  -Chest x-ray showed vascular congestion with cardiomegaly, small left pleural  effusion, possible pulmonary edema -Respiratory viral panel unremarkable   Acute on chronic diastolic heart failure -Echocardiogram: EF 60-65%, grade 1 diastolic dysfunction -Monitor intake and output, daily weights - at home oral lasix, was given one dose of IV lasix.  - CXR repeated today.   Essential hypertension - well controlled.   Acute on Chronic kidney disease, stage III -Baseline creatinine approximately 1.2-1.3, currently 1.4.  Obstructive sleep apnea -Continue CPAP QHS -Patient stated she lost or displaced her CPAP back in 2013.  -Spoke to case management regarding possibly obtaining another CPAP vs outpatient sleep study  GERD -Continue PPI  Gout -Continue allopurinol and indomethacin  Hyperlipidemia -Continue Lipitor  Chest pain -Likely pleuritic -Troponin unremarkable -Echocardiogram as above -Continue aspirin, statin, Imdur  - resolved.   Depression/anxiety -Continue Lexapro, Xanax PRN  Headache -States she takes pain medicine for this -Would like a scan of her head.  Patient has no neurological deficits. -Defer to outpatient workup and follow up -Continue pain control -CT head unremarkable, normal  DVT Prophylaxis  heparin   Code Status: Full  Family Communication: None at bedside  Disposition Plan: possibly home in am.   Consultants None  Procedures  Echocardiogram Lower extremity Doppler VQ scan  Antibiotics   Anti-infectives    None      Subjective:   Kara Vincent seen and examined today. Continues to complain of not feeling well and having shortness of breath and cough,. Reports having a headache, RN reports that CPAP was not on properly.   Objective:   Vitals:   12/18/15 1429 12/18/15 2154 12/19/15 0702 12/19/15 0856  BP:  118/64 (!) 112/49   Pulse:  70 69  Resp:  20 18   Temp:   98.2 F (36.8 C)   TempSrc:   Oral   SpO2: 96% 96% 93% 95%  Weight:      Height:        Intake/Output Summary (Last 24 hours)  at 12/19/15 1421 Last data filed at 12/19/15 1400  Gross per 24 hour  Intake          1188.33 ml  Output              200 ml  Net           988.33 ml   Filed Weights   12/15/15 0445 12/17/15 0608 12/18/15 1359  Weight: (!) 183.8 kg (405 lb 1.6 oz) (!) 186.4 kg (411 lb) (!) 188 kg (414 lb 8 oz)    Exam  General: Well developed, well nourished, chronically ill, morbidly obese  HEENT: NCAT, mucous membranes moist.   Cardiovascular: S1 S2 auscultated, ?1/6SEM, RRR  Respiratory: Diminished breath sounds, but clear.  Appears comfortable, no increased work of breathing, able to speak clearly without stopping, no coughing  Abdomen: Soft, Morbidly obese, nontender, nondistended, + bowel sounds  Extremities: warm dry without cyanosis clubbing or edema  Neuro: AAOx3, Nonfocal    Data Reviewed: I have personally reviewed following labs and imaging studies  CBC:  Recent Labs Lab 12/13/15 2120 12/15/15 0530 12/16/15 0515  WBC 10.6* 7.6 13.2*  HGB 13.2 12.8 11.8*  HCT 41.9 41.4 38.5  MCV 93.7 93.9 94.1  PLT 239 201 189   Basic Metabolic Panel:  Recent Labs Lab 12/15/15 0530 12/16/15 0515 12/17/15 0508 12/17/15 1308 12/18/15 0506 12/19/15 0514  NA 138 136 139  --  139 141  K 5.0 5.1 5.3* 4.9 4.6 5.5*  CL 97* 97* 99*  --  97* 100*  CO2 33* 30 33*  --  35* 35*  GLUCOSE 176* 156* 129*  --  113* 101*  BUN 29* 44* 57*  --  62* 52*  CREATININE 1.42* 1.74* 1.76*  --  1.93* 1.40*  CALCIUM 8.5* 8.2* 8.0*  --  7.8* 7.9*   GFR: Estimated Creatinine Clearance: 82.1 mL/min (by C-G formula based on SCr of 1.4 mg/dL (H)). Liver Function Tests: No results for input(s): AST, ALT, ALKPHOS, BILITOT, PROT, ALBUMIN in the last 168 hours. No results for input(s): LIPASE, AMYLASE in the last 168 hours. No results for input(s): AMMONIA in the last 168 hours. Coagulation Profile:  Recent Labs Lab 12/14/15 0303  INR 0.98   Cardiac Enzymes:  Recent Labs Lab 12/14/15 0303  12/14/15 0757 12/14/15 1613  TROPONINI <0.03 <0.03 <0.03   BNP (last 3 results) No results for input(s): PROBNP in the last 8760 hours. HbA1C: No results for input(s): HGBA1C in the last 72 hours. CBG: No results for input(s): GLUCAP in the last 168 hours. Lipid Profile: No results for input(s): CHOL, HDL, LDLCALC, TRIG, CHOLHDL, LDLDIRECT in the last 72 hours. Thyroid Function Tests: No results for input(s): TSH, T4TOTAL, FREET4, T3FREE, THYROIDAB in the last 72 hours. Anemia Panel: No results for input(s): VITAMINB12, FOLATE, FERRITIN, TIBC, IRON, RETICCTPCT in the last 72 hours. Urine analysis:    Component Value Date/Time   COLORURINE YELLOW 12/01/2014 2240   APPEARANCEUR CLEAR 12/01/2014 2240   LABSPEC >1.030 (H) 12/01/2014 2240   PHURINE 5.0 12/01/2014 2240   GLUCOSEU NEGATIVE 12/01/2014 2240   GLUCOSEU NEG mg/dL 16/12/9602 5409   HGBUR NEGATIVE 12/01/2014 2240   HGBUR negative 12/05/2009 1003   BILIRUBINUR  NEGATIVE 12/01/2014 2240   BILIRUBINUR small 07/31/2010 1713   KETONESUR NEGATIVE 12/01/2014 2240   PROTEINUR NEGATIVE 12/01/2014 2240   UROBILINOGEN 0.2 12/01/2014 2240   NITRITE NEGATIVE 12/01/2014 2240   LEUKOCYTESUR NEGATIVE 12/01/2014 2240   Sepsis Labs: @LABRCNTIP (procalcitonin:4,lacticidven:4)  ) Recent Results (from the past 240 hour(s))  Respiratory Panel by PCR     Status: None   Collection Time: 12/14/15  6:11 AM  Result Value Ref Range Status   Adenovirus NOT DETECTED NOT DETECTED Final   Coronavirus 229E NOT DETECTED NOT DETECTED Final   Coronavirus HKU1 NOT DETECTED NOT DETECTED Final   Coronavirus NL63 NOT DETECTED NOT DETECTED Final   Coronavirus OC43 NOT DETECTED NOT DETECTED Final   Metapneumovirus NOT DETECTED NOT DETECTED Final   Rhinovirus / Enterovirus NOT DETECTED NOT DETECTED Final   Influenza A NOT DETECTED NOT DETECTED Final   Influenza B NOT DETECTED NOT DETECTED Final   Parainfluenza Virus 1 NOT DETECTED NOT DETECTED Final    Parainfluenza Virus 2 NOT DETECTED NOT DETECTED Final   Parainfluenza Virus 3 NOT DETECTED NOT DETECTED Final   Parainfluenza Virus 4 NOT DETECTED NOT DETECTED Final   Respiratory Syncytial Virus NOT DETECTED NOT DETECTED Final   Bordetella pertussis NOT DETECTED NOT DETECTED Final   Chlamydophila pneumoniae NOT DETECTED NOT DETECTED Final   Mycoplasma pneumoniae NOT DETECTED NOT DETECTED Final    Comment: Performed at [facility]  MRSA PCR Screening     Status: Abnormal   Collection Time: 12/14/15  6:11 AM  Result Value Ref Range Status   MRSA by PCR POSITIVE (A) NEGATIVE Final    Comment:        The GeneXpert MRSA Assay (FDA approved for NASAL specimens only), is one component of a comprehensive MRSA colonization surveillance program. It is not intended to diagnose MRSA infection nor to guide or monitor treatment for MRSA infections. RESULT CALLED TO, READ BACK BY AND VERIFIED WITH: Shari Heritage, RN AT 11:53 12/14/15 BY Rito Ehrlich       Radiology Studies: Dg Chest Port 1 View  Result Date: 12/19/2015 CLINICAL DATA:  Recent pulmonary vascular congestion EXAM: PORTABLE CHEST 1 VIEW COMPARISON:  December 16, 2015 FINDINGS: There is no edema or consolidation. There remains cardiomegaly with pulmonary venous hypertension. No adenopathy. No bone lesions. IMPRESSION: Persistent evidence of pulmonary vascular congestion without frank edema or consolidation. Electronically Signed   By: Bretta Bang III M.D.   On: 12/19/2015 09:37     Scheduled Meds: . allopurinol  100 mg Oral q morning - 10a  . aspirin  325 mg Oral q morning - 10a  . atorvastatin  20 mg Oral q morning - 10a  . benzonatate  200 mg Oral TID  . Chlorhexidine Gluconate Cloth  6 each Topical Q0600  . darifenacin  7.5 mg Oral Daily  . dextromethorphan-guaiFENesin  1 tablet Oral BID  . escitalopram  10 mg Oral Daily  . folic acid  1 mg Oral Daily  . gabapentin  100 mg Oral BID  . heparin subcutaneous  5,000  Units Subcutaneous Q8H  . indomethacin  75 mg Oral BID WC  . ipratropium-albuterol  3 mL Nebulization TID  . isosorbide mononitrate  30 mg Oral Daily  . lubriderm seriously sensitive  1 application Topical TID  . omega-3 acid ethyl esters  1 g Oral Daily  . pantoprazole  40 mg Oral q morning - 10a  . predniSONE  40 mg Oral Q breakfast  .  topiramate  100 mg Oral BID  . vitamin E  100 Units Oral Daily   Continuous Infusions:     LOS: 5 days   Time Spent in minutes   30 minutes  Kara Arriaga MD. on 12/19/2015 at 2:21 PM  Between 7am to 7pm - Pager - [phone]  After 7pm go to [email or web address] - password [email or web address]  And look for the night coverage person covering for me after hours  [facility] Office  [phone]

## 2015-12-19 NOTE — Progress Notes (Signed)
PT Cancellation Note  Patient Details Name: Kara Vincent MRN: [id number] DOB: 12-17-1965   Cancelled Treatment:     pt declined.  Currently on [facility] then requested to eat her lunch that was sitting on her table.  Will attempt to see again as schedule permits.     Rica Koyanagi  PTA [facility]  Acute  Rehab Pager      [phone]

## 2015-12-19 NOTE — Progress Notes (Signed)
Occupational Therapy Treatment Patient Details Name: CHRISTLYN AMMAR MRN: [id number] DOB: [birth year] Today's Date: 12/19/2015    History of present illness 50 yo female admitted with SOB respiratory failure with hypoxia. pt noted to have Asthma exacerbation and acute on chronic CHF. PMH: CHF, chronic pain, OSA, morbid obesity   OT comments  Progressing slowly towards OT goals. Continue OT per plan of care.  Follow Up Recommendations  No OT follow up    Equipment Recommendations  3 in 1 bedside comode;Tub/shower seat    Recommendations for Other Services      Precautions / Restrictions Precautions Precautions: Fall Restrictions Weight Bearing Restrictions: No       Mobility Bed Mobility Overal bed mobility: Modified Independent                Transfers                      Balance                                   ADL Overall ADL's : Needs assistance/impaired                         Toilet Transfer: Stand-pivot;BSC;Supervision/safety Toilet Transfer Details (indicate cue type and reason): stand pivot from 3n1 to bed Toileting- Clothing Manipulation and Hygiene: Minimal assistance;Sit to/from stand       Functional mobility during ADLs: Supervision/safety General ADL Comments: Patient reports feeling SOB. Educated on energy conservation techniques. Patient reports she has a caregiver at home that is there nearly 24/7. Encouraged pt to get OOB throughout the day and try to sit in recliner. She was agreeable to try once her SOB got better.       Vision                     Perception     Praxis      Cognition   Behavior During Therapy: WFL for tasks assessed/performed Overall Cognitive Status: Within Functional Limits for tasks assessed                       Extremity/Trunk Assessment               Exercises     Shoulder Instructions       General Comments      Pertinent Vitals/  Pain       Pain Assessment: No/denies pain  Home Living                                          Prior Functioning/Environment              Frequency  Min 2X/week        Progress Toward Goals  OT Goals(current goals can now be found in the care plan section)  Progress towards OT goals: Progressing toward goals  Acute Rehab OT Goals Patient Stated Goal: none stated by patient  Plan Discharge plan remains appropriate    Co-evaluation                 End of Session Equipment Utilized During Treatment: Oxygen   Activity Tolerance Patient tolerated treatment well   Patient Left in bed;with call bell/phone within reach  Nurse Communication          Time: 2440-1027 OT Time Calculation (min): 9 min  Charges: OT General Charges $OT Visit: 1 Procedure OT Treatments $Self Care/Home Management : 8-22 mins  Jaionna Weisse A 12/19/2015, 12:04 PM

## 2015-12-20 LAB — BLOOD GAS, ARTERIAL
ACID-BASE EXCESS: 7.5 mmol/L — AB (ref 0.0–2.0)
ACID-BASE EXCESS: 9.5 mmol/L — AB (ref 0.0–2.0)
BICARBONATE: 37.5 mmol/L — AB (ref 20.0–28.0)
Bicarbonate: 37.1 mmol/L — ABNORMAL HIGH (ref 20.0–28.0)
DELIVERY SYSTEMS: POSITIVE
DRAWN BY: 41308
Delivery systems: POSITIVE
Drawn by: 331471
EXPIRATORY PAP: 6
FIO2: 30
Inspiratory PAP: 14
O2 Content: 3 L/min
O2 Saturation: 88.7 %
O2 Saturation: 93.9 %
PATIENT TEMPERATURE: 98.6
PCO2 ART: 85.4 mmHg — AB (ref 32.0–48.0)
PCO2 ART: 73.3 mmHg — AB (ref 32.0–48.0)
PH ART: 7.26 — AB (ref 7.350–7.450)
PH ART: 7.329 — AB (ref 7.350–7.450)
PO2 ART: 71.6 mmHg — AB (ref 83.0–108.0)
Patient temperature: 98.6
RATE: 8 resp/min
pO2, Arterial: 60.5 mmHg — ABNORMAL LOW (ref 83.0–108.0)

## 2015-12-20 LAB — BASIC METABOLIC PANEL
ANION GAP: 7 (ref 5–15)
BUN: 43 mg/dL — ABNORMAL HIGH (ref 6–20)
CALCIUM: 7.7 mg/dL — AB (ref 8.9–10.3)
CO2: 34 mmol/L — AB (ref 22–32)
Chloride: 99 mmol/L — ABNORMAL LOW (ref 101–111)
Creatinine, Ser: 1.23 mg/dL — ABNORMAL HIGH (ref 0.44–1.00)
GFR calc Af Amer: 58 mL/min — ABNORMAL LOW (ref >60)
GFR calc non Af Amer: 50 mL/min — ABNORMAL LOW (ref >60)
GLUCOSE: 90 mg/dL (ref 65–99)
Potassium: 5 mmol/L (ref 3.5–5.1)
Sodium: 140 mmol/L (ref 135–145)

## 2015-12-20 IMAGING — CR DG CHEST 1V PORT
1 series · 2 of 2 positions shown · non-contrast
Comparison: 04/14/2013

CLINICAL DATA: Cough, congestion, shortness of breath, chest pain.

EXAM:
PORTABLE CHEST - 1 VIEW

[Series 1: AP · U · 2 of 2 slices shown]
[im 1/2]
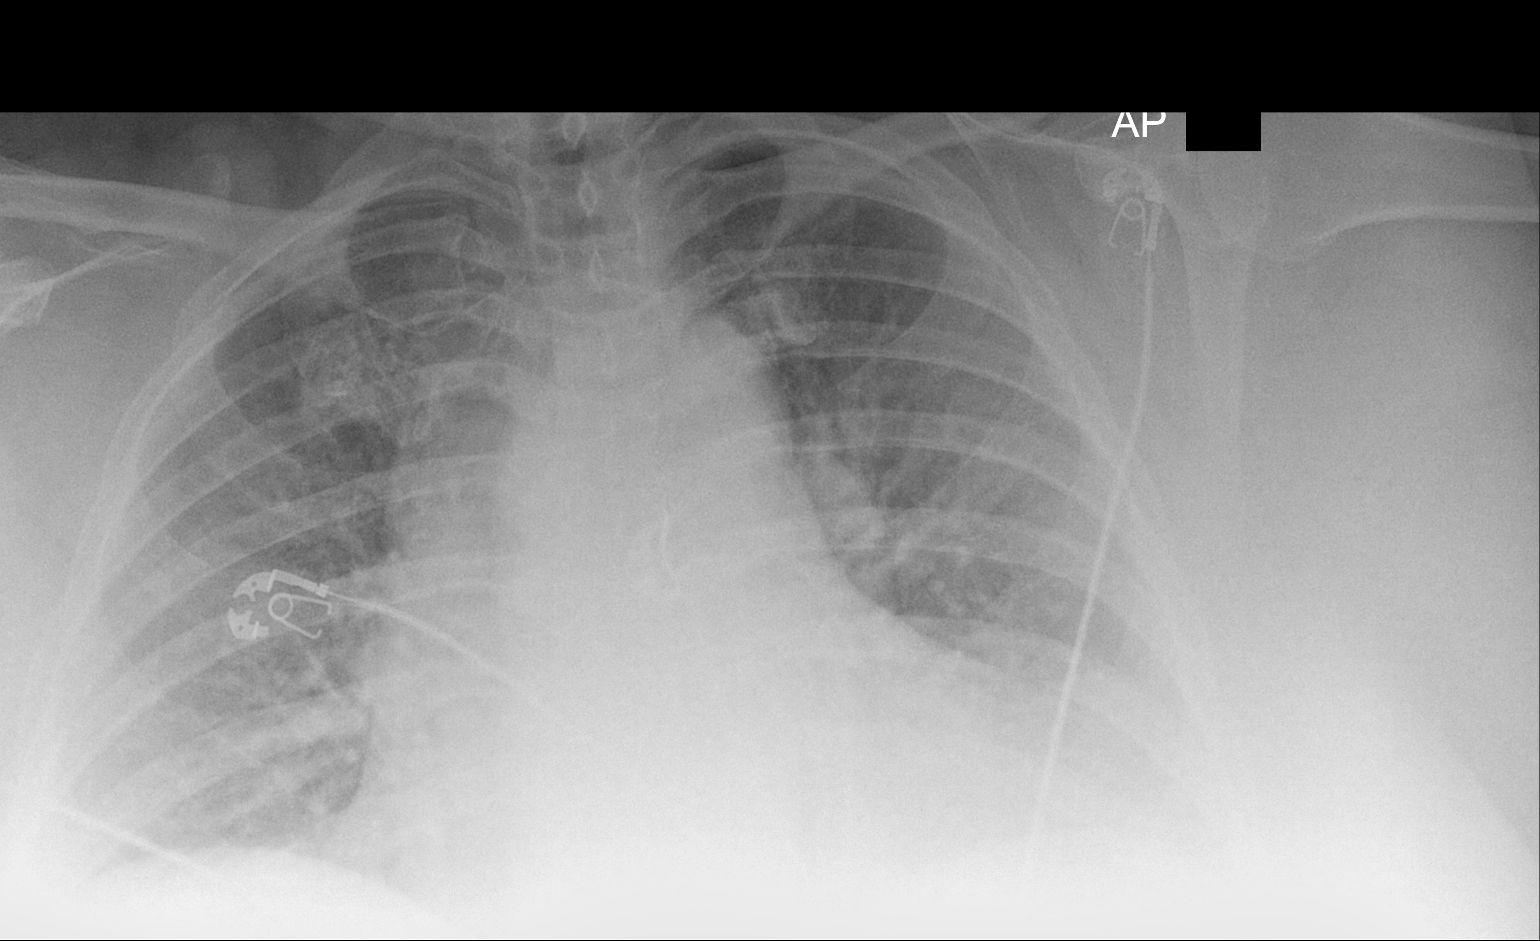
[im 2/2]
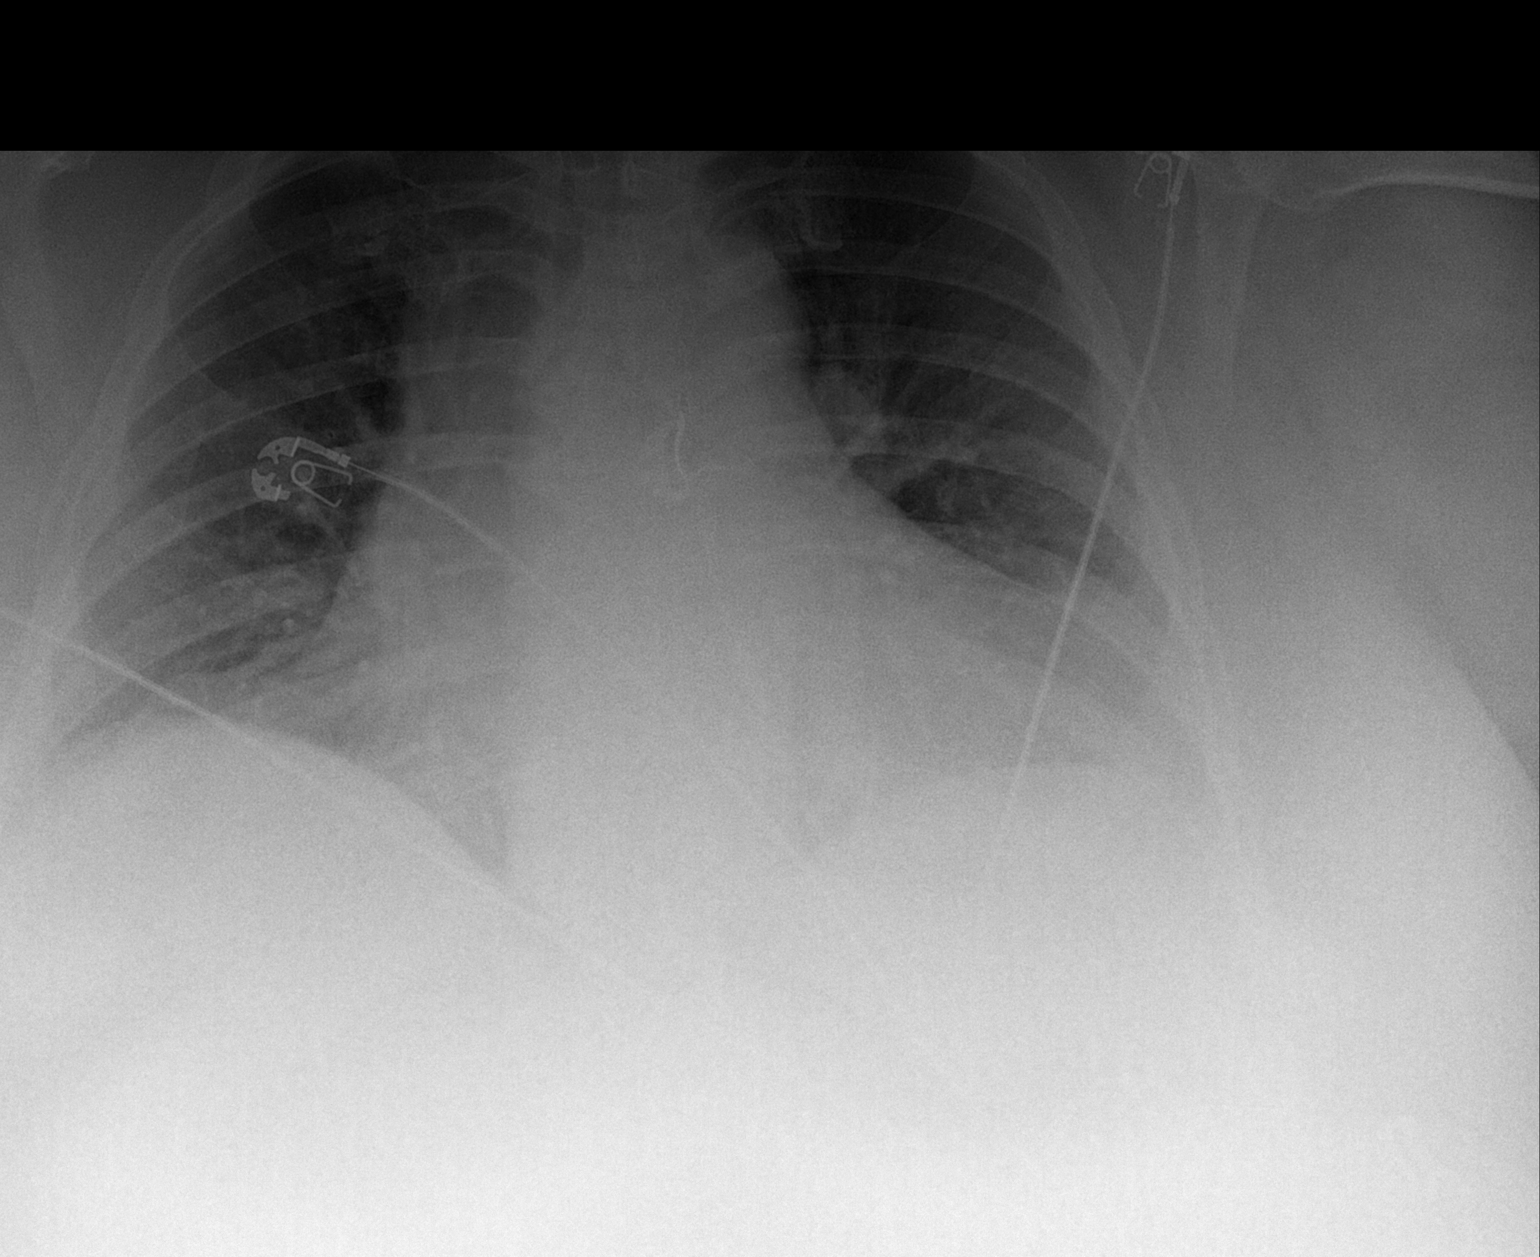

[2 of 2 positions shown; findings below may reference images not displayed]

FINDINGS: Degraded by portable technique and patient body habitus. Prominent
cardiomediastinal contours. Central vascular congestion.
Interstitial prominence may be accentuated by overlying soft
tissues. No large consolidation. Small effusions not excluded. No
pneumothorax. No acute osseous finding.
IMPRESSION: Prominent cardiomediastinal contours, similar to prior. Central
vascular congestion. Slight interstitial prominence may reflect
interstitial edema.

## 2015-12-20 NOTE — Progress Notes (Signed)
Order to do  ABG. RT talked to MD regarding ABG. OK per MD to place pt on her CPAP machine and then do ABG around 14:00. Pt is lethargy but pt refuse to wear her CPAP QHS last night. Vitals stable pt can talk but is very sleepy.

## 2015-12-20 NOTE — Progress Notes (Signed)
PT Cancellation Note  Patient Details Name: Kara Vincent MRN: [id number] DOB: 22-Aug-1965   Cancelled Treatment:     pt transferring to step down for Bipap need. Will check back another day as schedule permits.   Nathanial Rancher 12/20/2015, 3:28 PM

## 2015-12-20 NOTE — Progress Notes (Signed)
Pt more awake rt took pt off CPAP and placed on 3lpm [place].

## 2015-12-20 NOTE — Progress Notes (Signed)
Notified Dr. Karleen Hampshire regarding ABG results, patient will be transferred to ICU for BIPAP.

## 2015-12-20 NOTE — Progress Notes (Signed)
RN placed pt back on her CPAP machine due to pt's ABG. Pt waiting on and ICU bed to start BIPAP.

## 2015-12-20 NOTE — Progress Notes (Signed)
PROGRESS NOTE    Kara Vincent  VQQ:[id number] DOB: 03/03/1966 DOA: 12/13/2015 PCP: No PCP Per Patient   Chief Complaint  Patient presents with  . Shortness of Breath  . Chest Pain    Brief Narrative:  HPI on 12/14/2015 by Dr. Lorretta Harp Kara Vincent is a 50 y.o. female with medical history significant of hypertension, hyperlipidemia, asthma, OSA on CPAP, morbid obesity, right heart failure, diastolic congestive heart failure, drug abuse, Darer's disease, wheelchair-bound, CKD-III, GERD, gout, anxiety, who presents with shortness of breath, chest pain, leg edema, cough.  Patient states that she has been having shortness of breath, chest pain, cough for more than one week. Her chest pain is located in left lower chest. It is constant, 8 out of 10 in severity, nonradiating, pleuritic, aggravated by coughing and deep breath. He coughs up yellow colored sputum. No fever or chills. He has bilateral lower leg pain, but it not localized to calf areas. He has bilateral lower leg edema. Denies nausea, vomiting, abdominal pain, diarrhea, symptoms of UTI. She is wheelchair bounded, no unilateral weakness, vision change or hearing loss.     Assessment & Plan   Acute on chronic respiratory failure with hypoxia/Cough -Likely multifactorial including CHF vs asthma exacerbation and non compliance to CPAP.  -BNP 45, however  Repeat chest x-ray does show pulmonary edema and vascular congestion -VQ scan showed low probability of pulmonary embolus. LE doppler showed no evidence of DVT or SVT - pt refused to wear CPAP last night and did not keep the CPAP on this morning when the RN came.  - she was found tobe lethargic, but able to answer simple questions with her eyes closed.  - she was put on CPAP AND and ABG was obtained showed respiratory acidosis with hypercarbia. She was transferred to step down for BIPAP and repeat ABG ordered at 10 pm.   Asthma exacerbation Scattered wheezing heard. On  tapering steroids.  -Respiratory viral panel unremarkable   Acute on chronic diastolic heart failure -Echocardiogram: EF 60-65%, grade 1 diastolic dysfunction -Monitor intake and output, daily weights - at home oral lasix, resume 20 mg of daiy lasix.  - CXR repeated on 9/27 shows persistent vascular congestion. .   Essential hypertension - well controlled.   Acute on Chronic kidney disease, stage III -Baseline creatinine approximately 1.2-1.3, currently 1.4.  Obstructive sleep apnea Very non compliant.  -Continue CPAP QHS -Patient stated she lost or displaced her CPAP back in 2013.  -Spoke to case management regarding possibly obtaining another CPAP vs outpatient sleep study   GERD -Continue PPI  Gout -Continue allopurinol and indomethacin  Hyperlipidemia -Continue Lipitor  Chest pain -Likely pleuritic -Troponin unremarkable -Echocardiogram as above -Continue aspirin, statin, Imdur  - resolved.   Depression/anxiety -Continue Lexapro, Xanax PRN  Headache -States she takes pain medicine for this, suspect its probably from being non compliant to CPAP, her pain seems to get better with compliance.  -Defer to outpatient workup and follow up -Continue pain control -CT head unremarkable, normal  DVT Prophylaxis  heparin   Code Status: Full  Family Communication: None at bedside  Disposition Plan: transfer to step down.   Consultants None  Procedures  Echocardiogram Lower extremity Doppler VQ scan  Antibiotics   Anti-infectives    None      Subjective:   Kymia Pea seen and examined today. Very drowsy, mumbling.   Objective:   Vitals:   12/19/15 2301 12/20/15 0558 12/20/15 0828 12/20/15 1500  BP:  122/68  (!) 111/59  Pulse: 77 63  66  Resp: 18 18  18   Temp:  98.6 F (37 C)  98.2 F (36.8 C)  TempSrc:  Oral  Oral  SpO2: 97% 93% 96% 92%  Weight:      Height:        Intake/Output Summary (Last 24 hours) at 12/20/15 1637 Last data  filed at 12/20/15 0900  Gross per 24 hour  Intake              860 ml  Output                0 ml  Net              860 ml   Filed Weights   12/17/15 0608 12/18/15 1359 12/19/15 1837  Weight: (!) 186.4 kg (411 lb) (!) 188 kg (414 lb 8 oz) (!) 189.6 kg (417 lb 15.9 oz)    Exam  General: Well developed, well nourished, chronically ill, morbidly obese  HEENT: NCAT, mucous membranes moist.   Cardiovascular: S1 S2 auscultated, ?1/6SEM, RRR  Respiratory: Diminished breath sounds, scattered wheezing. [name]  Appears comfortable, no increased work of breathing,   Abdomen: Soft, Morbidly obese, nontender, nondistended, + bowel sounds  Extremities: warm dry without cyanosis clubbing or edema  Neuro: AAOx3, Nonfocal    Data Reviewed: I have personally reviewed following labs and imaging studies  CBC:  Recent Labs Lab 12/13/15 2120 12/15/15 0530 12/16/15 0515  WBC 10.6* 7.6 13.2*  HGB 13.2 12.8 11.8*  HCT 41.9 41.4 38.5  MCV 93.7 93.9 94.1  PLT 239 201 189   Basic Metabolic Panel:  Recent Labs Lab 12/16/15 0515 12/17/15 0508 12/17/15 1308 12/18/15 0506 12/19/15 0514 12/20/15 0509  NA 136 139  --  139 141 140  K 5.1 5.3* 4.9 4.6 5.5* 5.0  CL 97* 99*  --  97* 100* 99*  CO2 30 33*  --  35* 35* 34*  GLUCOSE 156* 129*  --  113* 101* 90  BUN 44* 57*  --  62* 52* 43*  CREATININE 1.74* 1.76*  --  1.93* 1.40* 1.23*  CALCIUM 8.2* 8.0*  --  7.8* 7.9* 7.7*   GFR: Estimated Creatinine Clearance: 94 mL/min (by C-G formula based on SCr of 1.23 mg/dL (H)). Liver Function Tests: No results for input(s): AST, ALT, ALKPHOS, BILITOT, PROT, ALBUMIN in the last 168 hours. No results for input(s): LIPASE, AMYLASE in the last 168 hours. No results for input(s): AMMONIA in the last 168 hours. Coagulation Profile:  Recent Labs Lab 12/14/15 0303  INR 0.98   Cardiac Enzymes:  Recent Labs Lab 12/14/15 0303 12/14/15 0757 12/14/15 1613  TROPONINI <0.03 <0.03 <0.03   BNP (last 3  results) No results for input(s): PROBNP in the last 8760 hours. HbA1C: No results for input(s): HGBA1C in the last 72 hours. CBG: No results for input(s): GLUCAP in the last 168 hours. Lipid Profile: No results for input(s): CHOL, HDL, LDLCALC, TRIG, CHOLHDL, LDLDIRECT in the last 72 hours. Thyroid Function Tests: No results for input(s): TSH, T4TOTAL, FREET4, T3FREE, THYROIDAB in the last 72 hours. Anemia Panel: No results for input(s): VITAMINB12, FOLATE, FERRITIN, TIBC, IRON, RETICCTPCT in the last 72 hours. Urine analysis:    Component Value Date/Time   COLORURINE YELLOW 12/01/2014 2240   APPEARANCEUR CLEAR 12/01/2014 2240   LABSPEC >1.030 (H) 12/01/2014 2240   PHURINE 5.0 12/01/2014 2240   GLUCOSEU NEGATIVE 12/01/2014 2240   GLUCOSEU NEG mg/dL  10/01/2007 2007   HGBUR NEGATIVE 12/01/2014 2240   HGBUR negative 12/05/2009 1003   BILIRUBINUR NEGATIVE 12/01/2014 2240   BILIRUBINUR small 07/31/2010 1713   KETONESUR NEGATIVE 12/01/2014 2240   PROTEINUR NEGATIVE 12/01/2014 2240   UROBILINOGEN 0.2 12/01/2014 2240   NITRITE NEGATIVE 12/01/2014 2240   LEUKOCYTESUR NEGATIVE 12/01/2014 2240   Sepsis Labs: @LABRCNTIP (procalcitonin:4,lacticidven:4)  ) Recent Results (from the past 240 hour(s))  Respiratory Panel by PCR     Status: None   Collection Time: 12/14/15  6:11 AM  Result Value Ref Range Status   Adenovirus NOT DETECTED NOT DETECTED Final   Coronavirus 229E NOT DETECTED NOT DETECTED Final   Coronavirus HKU1 NOT DETECTED NOT DETECTED Final   Coronavirus NL63 NOT DETECTED NOT DETECTED Final   Coronavirus OC43 NOT DETECTED NOT DETECTED Final   Metapneumovirus NOT DETECTED NOT DETECTED Final   Rhinovirus / Enterovirus NOT DETECTED NOT DETECTED Final   Influenza A NOT DETECTED NOT DETECTED Final   Influenza B NOT DETECTED NOT DETECTED Final   Parainfluenza Virus 1 NOT DETECTED NOT DETECTED Final   Parainfluenza Virus 2 NOT DETECTED NOT DETECTED Final   Parainfluenza Virus  3 NOT DETECTED NOT DETECTED Final   Parainfluenza Virus 4 NOT DETECTED NOT DETECTED Final   Respiratory Syncytial Virus NOT DETECTED NOT DETECTED Final   Bordetella pertussis NOT DETECTED NOT DETECTED Final   Chlamydophila pneumoniae NOT DETECTED NOT DETECTED Final   Mycoplasma pneumoniae NOT DETECTED NOT DETECTED Final    Comment: Performed at [facility]  MRSA PCR Screening     Status: Abnormal   Collection Time: 12/14/15  6:11 AM  Result Value Ref Range Status   MRSA by PCR POSITIVE (A) NEGATIVE Final    Comment:        The GeneXpert MRSA Assay (FDA approved for NASAL specimens only), is one component of a comprehensive MRSA colonization surveillance program. It is not intended to diagnose MRSA infection nor to guide or monitor treatment for MRSA infections. RESULT CALLED TO, READ BACK BY AND VERIFIED WITH: Shari Heritage, RN AT 11:53 12/14/15 BY Rito Ehrlich       Radiology Studies: Dg Chest Port 1 View  Result Date: 12/19/2015 CLINICAL DATA:  Recent pulmonary vascular congestion EXAM: PORTABLE CHEST 1 VIEW COMPARISON:  December 16, 2015 FINDINGS: There is no edema or consolidation. There remains cardiomegaly with pulmonary venous hypertension. No adenopathy. No bone lesions. IMPRESSION: Persistent evidence of pulmonary vascular congestion without frank edema or consolidation. Electronically Signed   By: Bretta Bang III M.D.   On: 12/19/2015 09:37     Scheduled Meds: . allopurinol  100 mg Oral q morning - 10a  . aspirin  325 mg Oral q morning - 10a  . atorvastatin  20 mg Oral q morning - 10a  . benzonatate  200 mg Oral TID  . darifenacin  7.5 mg Oral Daily  . dextromethorphan-guaiFENesin  1 tablet Oral BID  . escitalopram  10 mg Oral Daily  . folic acid  1 mg Oral Daily  . furosemide  20 mg Oral Daily  . gabapentin  100 mg Oral BID  . heparin subcutaneous  5,000 Units Subcutaneous Q8H  . indomethacin  75 mg Oral BID WC  . ipratropium-albuterol  3 mL  Nebulization TID  . isosorbide mononitrate  30 mg Oral Daily  . lubriderm seriously sensitive  1 application Topical TID  . omega-3 acid ethyl esters  1 g Oral Daily  . pantoprazole  40 mg Oral  q morning - 10a  . predniSONE  20 mg Oral Q breakfast  . topiramate  100 mg Oral BID  . vitamin E  100 Units Oral Daily   Continuous Infusions:     LOS: 6 days   Time Spent in minutes   30 minutes  Akesha Uresti MD. on 12/20/2015 at 4:37 PM  Between 7am to 7pm - Pager - [phone]  After 7pm go to [email or web address] - password [email or web address]  And look for the night coverage person covering for me after hours  [facility] Office  [phone]

## 2015-12-20 NOTE — Progress Notes (Signed)
Pt plan to discharge home with no needs.  

## 2015-12-20 NOTE — Progress Notes (Signed)
ABG results given to S.Lewis,RN.

## 2015-12-21 LAB — BASIC METABOLIC PANEL
Anion gap: 6 (ref 5–15)
BUN: 42 mg/dL — ABNORMAL HIGH (ref 6–20)
CALCIUM: 8.1 mg/dL — AB (ref 8.9–10.3)
CHLORIDE: 95 mmol/L — AB (ref 101–111)
CO2: 38 mmol/L — AB (ref 22–32)
CREATININE: 1.54 mg/dL — AB (ref 0.44–1.00)
GFR calc non Af Amer: 38 mL/min — ABNORMAL LOW (ref >60)
GFR, EST AFRICAN AMERICAN: 44 mL/min — AB (ref >60)
GLUCOSE: 154 mg/dL — AB (ref 65–99)
Potassium: 4.3 mmol/L (ref 3.5–5.1)
Sodium: 139 mmol/L (ref 135–145)

## 2015-12-21 NOTE — Progress Notes (Signed)
Patient transferred to 1337 via bari bed. Report given to elsie. Husband at bedside. He stated he had all of patient's belongings and re-checked room before transfer.

## 2015-12-21 NOTE — Progress Notes (Signed)
Date:  December 21, 2015 Chart reviewed for concurrent status and case management needs. Will continue to follow the patient for status change: transferred to sdu due to respdistress placed on bipap/when off bipap o2 at 4-6l/min/[place] Discharge Planning: following for needs Expected discharge date: [id number] Velva Harman, BSN, [place], [place]

## 2015-12-21 NOTE — Progress Notes (Signed)
PROGRESS NOTE    Kara Vincent  FAO:[id number] DOB: [birth year] DOA: 12/13/2015 PCP: No PCP Per Patient   Chief Complaint  Patient presents with  . Shortness of Breath  . Chest Pain    Brief Narrative:  HPI on 12/14/2015 by Dr. Lorretta Harp Kara Vincent is a 50 y.o. female with medical history significant of hypertension, hyperlipidemia, asthma, OSA on CPAP, morbid obesity, right heart failure, diastolic congestive heart failure, drug abuse, Darer's disease, wheelchair-bound, CKD-III, GERD, gout, anxiety, who presents with shortness of breath, chest pain, leg edema, cough.  Patient states that she has been having shortness of breath, chest pain, cough for more than one week. Her chest pain is located in left lower chest. It is constant, 8 out of 10 in severity, nonradiating, pleuritic, aggravated by coughing and deep breath. He coughs up yellow colored sputum. No fever or chills. He has bilateral lower leg pain, but it not localized to calf areas. He has bilateral lower leg edema. Denies nausea, vomiting, abdominal pain, diarrhea, symptoms of UTI. She is wheelchair bounded, no unilateral weakness, vision change or hearing loss.     Assessment & Plan   Acute on chronic respiratory failure with hypoxia/Cough -Likely multifactorial including CHF vs asthma exacerbation and non compliance to CPAP.  -BNP 45, however  Repeat chest x-ray does show pulmonary edema and vascular congestion -VQ scan showed low probability of pulmonary embolus. LE doppler showed no evidence of DVT or SVT - pt required BIPAP the last 24 hours and is currently off bipap, alert awake and on [place] oxygen.  - she will be transferred out of step down to  Regular bed.   Asthma exacerbation No  Wheezing.  On tapering steroids.  -Respiratory viral panel unremarkable   Acute on chronic diastolic heart failure -Echocardiogram: EF 60-65%, grade 1 diastolic dysfunction -Monitor intake and output, daily weights - at home  oral lasix, resume 20 mg of daiy lasix.  - CXR repeated on 9/27 shows persistent vascular congestion. .  I/O last 3 completed shifts: In: 1100 [P.O.:1100] Out: 1425 [Urine:1425] Total I/O In: 480 [P.O.:480] Out: 1200 [Urine:1200]     Essential hypertension - well controlled.   Acute on Chronic kidney disease, stage III -Baseline creatinine approximately 1.2-1.3, currently 1.4.  Obstructive sleep apnea Very non compliant.  -Continue CPAP QHS -Patient stated she lost or displaced her CPAP back in 2013.  -Spoke to case management regarding possibly obtaining another CPAP vs outpatient sleep study   GERD -Continue PPI  Gout -Continue allopurinol and indomethacin  Hyperlipidemia -Continue Lipitor  Chest pain -Likely pleuritic -Troponin unremarkable -Echocardiogram as above -Continue aspirin, statin, Imdur  - resolved.   Depression/anxiety -Continue Lexapro, Xanax PRN  Headache -  Improved. -States she takes pain medicine for this, suspect its probably from being non compliant to CPAP, her pain seems to get better with compliance.  -Defer to outpatient workup and follow up -Continue pain control -CT head unremarkable, normal  DVT Prophylaxis  heparin   Code Status: Full  Family Communication: None at bedside  Disposition Plan: transfer to med surg  Consultants None  Procedures  Echocardiogram Lower extremity Doppler VQ scan  Antibiotics   Anti-infectives    None      Subjective:   Kara Vincent seen and examined today. Alert and oriented.   Objective:   Vitals:   12/21/15 0800 12/21/15 1128 12/21/15 1200 12/21/15 1343  BP:  133/68 120/69 119/63  Pulse:  80 73 72  Resp:  17 15 16   Temp: 97.5 F (36.4 C)  98 F (36.7 C) 97.5 F (36.4 C)  TempSrc: Oral  Oral Oral  SpO2:  92% 93% 96%  Weight:      Height:    5\' 5"  (1.651 m)    Intake/Output Summary (Last 24 hours) at 12/21/15 1451 Last data filed at 12/21/15 1200  Gross per 24  hour  Intake              720 ml  Output             2625 ml  Net            -1905 ml   Filed Weights   12/19/15 1837 12/20/15 2100 12/21/15 0126  Weight: (!) 189.6 kg (417 lb 15.9 oz) (!) 207 kg (456 lb 5.6 oz) (!) 207 kg (456 lb 5.6 oz)    Exam  General: Well developed, well nourished, chronically ill, morbidly obese  HEENT: NCAT, mucous membranes moist.   Cardiovascular: S1 S2 auscultated, ?1/6SEM, RRR  Respiratory: Diminished breath sounds, scattered wheezing. [name]  Appears comfortable, no increased work of breathing,   Abdomen: Soft, Morbidly obese, nontender, nondistended, + bowel sounds  Extremities: warm dry without cyanosis clubbing or edema  Neuro: AAOx3, Nonfocal    Data Reviewed: I have personally reviewed following labs and imaging studies  CBC:  Recent Labs Lab 12/15/15 0530 12/16/15 0515  WBC 7.6 13.2*  HGB 12.8 11.8*  HCT 41.4 38.5  MCV 93.9 94.1  PLT 201 189   Basic Metabolic Panel:  Recent Labs Lab 12/17/15 0508 12/17/15 1308 12/18/15 0506 12/19/15 0514 12/20/15 0509 12/21/15 0936  NA 139  --  139 141 140 139  K 5.3* 4.9 4.6 5.5* 5.0 4.3  CL 99*  --  97* 100* 99* 95*  CO2 33*  --  35* 35* 34* 38*  GLUCOSE 129*  --  113* 101* 90 154*  BUN 57*  --  62* 52* 43* 42*  CREATININE 1.76*  --  1.93* 1.40* 1.23* 1.54*  CALCIUM 8.0*  --  7.8* 7.9* 7.7* 8.1*   GFR: Estimated Creatinine Clearance: 79.8 mL/min (by C-G formula based on SCr of 1.54 mg/dL (H)). Liver Function Tests: No results for input(s): AST, ALT, ALKPHOS, BILITOT, PROT, ALBUMIN in the last 168 hours. No results for input(s): LIPASE, AMYLASE in the last 168 hours. No results for input(s): AMMONIA in the last 168 hours. Coagulation Profile: No results for input(s): INR, PROTIME in the last 168 hours. Cardiac Enzymes:  Recent Labs Lab 12/14/15 1613  TROPONINI <0.03   BNP (last 3 results) No results for input(s): PROBNP in the last 8760 hours. HbA1C: No results for  input(s): HGBA1C in the last 72 hours. CBG: No results for input(s): GLUCAP in the last 168 hours. Lipid Profile: No results for input(s): CHOL, HDL, LDLCALC, TRIG, CHOLHDL, LDLDIRECT in the last 72 hours. Thyroid Function Tests: No results for input(s): TSH, T4TOTAL, FREET4, T3FREE, THYROIDAB in the last 72 hours. Anemia Panel: No results for input(s): VITAMINB12, FOLATE, FERRITIN, TIBC, IRON, RETICCTPCT in the last 72 hours. Urine analysis:    Component Value Date/Time   COLORURINE YELLOW 12/01/2014 2240   APPEARANCEUR CLEAR 12/01/2014 2240   LABSPEC >1.030 (H) 12/01/2014 2240   PHURINE 5.0 12/01/2014 2240   GLUCOSEU NEGATIVE 12/01/2014 2240   GLUCOSEU NEG mg/dL 96/06/5407 8119   HGBUR NEGATIVE 12/01/2014 2240   HGBUR negative 12/05/2009 1003   BILIRUBINUR NEGATIVE 12/01/2014 2240   BILIRUBINUR  small 07/31/2010 1713   KETONESUR NEGATIVE 12/01/2014 2240   PROTEINUR NEGATIVE 12/01/2014 2240   UROBILINOGEN 0.2 12/01/2014 2240   NITRITE NEGATIVE 12/01/2014 2240   LEUKOCYTESUR NEGATIVE 12/01/2014 2240   Sepsis Labs: @LABRCNTIP (procalcitonin:4,lacticidven:4)  ) Recent Results (from the past 240 hour(s))  Respiratory Panel by PCR     Status: None   Collection Time: 12/14/15  6:11 AM  Result Value Ref Range Status   Adenovirus NOT DETECTED NOT DETECTED Final   Coronavirus 229E NOT DETECTED NOT DETECTED Final   Coronavirus HKU1 NOT DETECTED NOT DETECTED Final   Coronavirus NL63 NOT DETECTED NOT DETECTED Final   Coronavirus OC43 NOT DETECTED NOT DETECTED Final   Metapneumovirus NOT DETECTED NOT DETECTED Final   Rhinovirus / Enterovirus NOT DETECTED NOT DETECTED Final   Influenza A NOT DETECTED NOT DETECTED Final   Influenza B NOT DETECTED NOT DETECTED Final   Parainfluenza Virus 1 NOT DETECTED NOT DETECTED Final   Parainfluenza Virus 2 NOT DETECTED NOT DETECTED Final   Parainfluenza Virus 3 NOT DETECTED NOT DETECTED Final   Parainfluenza Virus 4 NOT DETECTED NOT DETECTED Final    Respiratory Syncytial Virus NOT DETECTED NOT DETECTED Final   Bordetella pertussis NOT DETECTED NOT DETECTED Final   Chlamydophila pneumoniae NOT DETECTED NOT DETECTED Final   Mycoplasma pneumoniae NOT DETECTED NOT DETECTED Final    Comment: Performed at [facility]  MRSA PCR Screening     Status: Abnormal   Collection Time: 12/14/15  6:11 AM  Result Value Ref Range Status   MRSA by PCR POSITIVE (A) NEGATIVE Final    Comment:        The GeneXpert MRSA Assay (FDA approved for NASAL specimens only), is one component of a comprehensive MRSA colonization surveillance program. It is not intended to diagnose MRSA infection nor to guide or monitor treatment for MRSA infections. RESULT CALLED TO, READ BACK BY AND VERIFIED WITH: Shari Heritage, RN AT 11:53 12/14/15 BY N. THOMPSON       Radiology Studies: No results found.   Scheduled Meds: . allopurinol  100 mg Oral q morning - 10a  . aspirin  325 mg Oral q morning - 10a  . atorvastatin  20 mg Oral q morning - 10a  . benzonatate  200 mg Oral TID  . darifenacin  7.5 mg Oral Daily  . dextromethorphan-guaiFENesin  1 tablet Oral BID  . escitalopram  10 mg Oral Daily  . folic acid  1 mg Oral Daily  . furosemide  20 mg Oral Daily  . gabapentin  100 mg Oral BID  . heparin subcutaneous  5,000 Units Subcutaneous Q8H  . indomethacin  75 mg Oral BID WC  . ipratropium-albuterol  3 mL Nebulization TID  . isosorbide mononitrate  30 mg Oral Daily  . lubriderm seriously sensitive  1 application Topical TID  . omega-3 acid ethyl esters  1 g Oral Daily  . pantoprazole  40 mg Oral q morning - 10a  . predniSONE  20 mg Oral Q breakfast  . topiramate  100 mg Oral BID  . vitamin E  100 Units Oral Daily   Continuous Infusions:     LOS: 7 days   Time Spent in minutes   30 minutes  Lysander Calixte MD. on 12/21/2015 at 2:51 PM  Between 7am to 7pm - Pager - [phone]  After 7pm go to [email or web address] - password [email or web address]  And look for the night  coverage person covering for me after hours  [facility]  Office  [phone]

## 2015-12-22 ENCOUNTER — Inpatient Hospital Stay (HOSPITAL_COMMUNITY): Payer: Medicaid Other

## 2015-12-22 MED ORDER — LEVOFLOXACIN IN D5W 750 MG/150ML IV SOLN
750.0000 mg | INTRAVENOUS | Status: DC
  Administered 2015-12-22 – 2015-12-23 (×2): 750 mg via INTRAVENOUS
  Filled 2015-12-22 (×2): qty 150

## 2015-12-22 NOTE — Progress Notes (Signed)
Physical Therapy Treatment Patient Details Name: Kara Vincent MRN: [id number] DOB: May 10, 1965 Today's Date: 12/22/2015    History of Present Illness 50 yo female admitted with SOB respiratory failure with hypoxia. pt noted to have Asthma exacerbation and acute on chronic CHF. PMH: CHF, chronic pain, OSA, morbid obesity    PT Comments    The patient presents with decreased endurance compared to previous active treatment before transfer to SDU. Currently relies on power chair which she states [employer] is not available on weekends to acommodate  Her power chair.  RN aware of the issue. Continue PT while in acute care.   Follow Up Recommendations  No PT follow up     Equipment Recommendations  Rolling walker with 5" wheels    Recommendations for Other Services       Precautions / Restrictions Precautions Precautions: Fall Precaution Comments: chronic 3L O2    Mobility  Bed Mobility Overal bed mobility: Modified Independent                Transfers   Equipment used: Rolling walker (2 wheeled) Transfers: Sit to/from Stand Sit to Stand: Modified independent (Device/Increase time)            Ambulation/Gait Ambulation/Gait assistance: Min guard Ambulation Distance (Feet): 10 Feet (x 2) Assistive device: Rolling walker (2 wheeled) Gait Pattern/deviations: Step-to pattern;Step-through pattern Gait velocity: decr   General Gait Details: very slow to mobilize, noted dyspnea 3/4. Sats 96% on 3 l   Stairs            Wheelchair Mobility    Modified Rankin (Stroke Patients Only)       Balance                                    Cognition Arousal/Alertness: Awake/alert                          Exercises      General Comments        Pertinent Vitals/Pain Faces Pain Scale: Hurts little more Pain Location: back Pain Descriptors / Indicators: Aching Pain Intervention(s): Limited activity within patient's tolerance;Monitored  during session    Home Living                      Prior Function            PT Goals (current goals can now be found in the care plan section) Progress towards PT goals: Progressing toward goals    Frequency    Min 3X/week      PT Plan Current plan remains appropriate    Co-evaluation             End of Session Equipment Utilized During Treatment: Oxygen Activity Tolerance: Patient limited by fatigue Patient left: with call bell/phone within reach;in bed     Time: 1535-1605 PT Time Calculation (min) (ACUTE ONLY): 30 min  Charges:  $Gait Training: 23-37 mins                    G Codes:      Claretha Cooper 12/22/2015, 4:10 PM Tresa Endo PT [phone]

## 2015-12-22 NOTE — Progress Notes (Signed)
PROGRESS NOTE    Kara Vincent  WUJ:[id number] DOB: [birth year] DOA: 12/13/2015 PCP: No PCP Per Patient   Chief Complaint  Patient presents with  . Shortness of Breath  . Chest Pain    Brief Narrative:  HPI on 12/14/2015 by Dr. Lorretta Harp Kara Vincent is a 50 y.o. female with medical history significant of hypertension, hyperlipidemia, asthma, OSA on CPAP, morbid obesity, right heart failure, diastolic congestive heart failure, drug abuse, Darer's disease, wheelchair-bound, CKD-III, GERD, gout, anxiety, who presents with shortness of breath, chest pain, leg edema, cough.  Patient states that she has been having shortness of breath, chest pain, cough for more than one week. Her chest pain is located in left lower chest. It is constant, 8 out of 10 in severity, nonradiating, pleuritic, aggravated by coughing and deep breath. He coughs up yellow colored sputum. No fever or chills. He has bilateral lower leg pain, but it not localized to calf areas. He has bilateral lower leg edema. Denies nausea, vomiting, abdominal pain, diarrhea, symptoms of UTI. She is wheelchair bounded, no unilateral weakness, vision change or hearing loss.     Assessment & Plan   Acute on chronic respiratory failure with hypoxia/Cough -Likely multifactorial including CHF vs asthma exacerbation and non compliance to CPAP.  -BNP 45, however  Repeat chest x-ray does show pulmonary edema and vascular congestion -VQ scan showed low probability of pulmonary embolus. LE doppler showed no evidence of DVT or SVT - currently back on [place] oxygen and on CPAP at night.  Pt reports worsening sob and coughing productive sputum, CXR shows right lower infiltrate.  strate dher on IV levaquin.   Asthma exacerbation No  Wheezing.  On tapering steroids.  -Respiratory viral panel unremarkable   Acute on chronic diastolic heart failure -Echocardiogram: EF 60-65%, grade 1 diastolic dysfunction -Monitor intake and output, daily  weights - at home oral lasix, resume 20 mg of daiy lasix.  - CXR repeated on 9/27 shows persistent vascular congestion. .  - CXR ON 9/30 shows right lower lobe infiltrate.  I/O last 3 completed shifts: In: 480 [P.O.:480] Out: 2775 [Urine:2775] Total I/O In: 750 [P.O.:600; IV Piggyback:150] Out: 200 [Urine:200]     Essential hypertension - well controlled.   Acute on Chronic kidney disease, stage III -Baseline creatinine approximately 1.2-1.3, currently 1.5.  Obstructive sleep apnea Very non compliant.  -Continue CPAP QHS -Patient stated she lost or displaced her CPAP back in 2013.  -Spoke to case management regarding possibly obtaining another CPAP vs outpatient sleep study   GERD -Continue PPI  Gout -Continue allopurinol and indomethacin  Hyperlipidemia -Continue Lipitor  Chest pain -Likely pleuritic -Troponin unremarkable -Echocardiogram as above -Continue aspirin, statin, Imdur  - resolved.   Depression/anxiety -Continue Lexapro, Xanax PRN  Headache -  Improved. -States she takes pain medicine for this, suspect its probably from being non compliant to CPAP, her pain seems to get better with compliance.  -Defer to outpatient workup and follow up -Continue pain control -CT head unremarkable, normal  DVT Prophylaxis  heparin   Code Status: Full  Family Communication: None at bedside  Disposition Plan: pending , possibly in 1 to 2 days.   Consultants None  Procedures  Echocardiogram Lower extremity Doppler VQ scan  Antibiotics   Anti-infectives    Start     Dose/Rate Route Frequency Ordered Stop   12/22/15 1600  levofloxacin (LEVAQUIN) IVPB 750 mg     750 mg 100 mL/hr over 90 Minutes Intravenous Every 24  hours 12/22/15 1552        Subjective:   Kara Vincent seen and examined today. Alert and oriented.   Objective:   Vitals:   12/21/15 2144 12/22/15 0611 12/22/15 0824 12/22/15 1500  BP:  (!) 159/89  (!) 99/51  Pulse:  (!) 53  78    Resp:  14  16  Temp:  97.4 F (36.3 C)  98.2 F (36.8 C)  TempSrc:  Oral  Oral  SpO2: 98% 96% 96% 94%  Weight:      Height:        Intake/Output Summary (Last 24 hours) at 12/22/15 1711 Last data filed at 12/22/15 1652  Gross per 24 hour  Intake              750 ml  Output              350 ml  Net              400 ml   Filed Weights   12/19/15 1837 12/20/15 2100 12/21/15 0126  Weight: (!) 189.6 kg (417 lb 15.9 oz) (!) 207 kg (456 lb 5.6 oz) (!) 207 kg (456 lb 5.6 oz)    Exam  General: Well developed, well nourished, chronically ill, morbidly obese  HEENT: NCAT, mucous membranes moist.   Cardiovascular: S1 S2 auscultated, ?1/6SEM, RRR  Respiratory: Diminished breath sounds, scattered wheezing. [name]  Appears comfortable, no increased work of breathing,   Abdomen: Soft, Morbidly obese, nontender, nondistended, + bowel sounds  Extremities: warm dry without cyanosis clubbing or edema  Neuro: AAOx3, Nonfocal    Data Reviewed: I have personally reviewed following labs and imaging studies  CBC:  Recent Labs Lab 12/16/15 0515  WBC 13.2*  HGB 11.8*  HCT 38.5  MCV 94.1  PLT 189   Basic Metabolic Panel:  Recent Labs Lab 12/17/15 0508 12/17/15 1308 12/18/15 0506 12/19/15 0514 12/20/15 0509 12/21/15 0936  NA 139  --  139 141 140 139  K 5.3* 4.9 4.6 5.5* 5.0 4.3  CL 99*  --  97* 100* 99* 95*  CO2 33*  --  35* 35* 34* 38*  GLUCOSE 129*  --  113* 101* 90 154*  BUN 57*  --  62* 52* 43* 42*  CREATININE 1.76*  --  1.93* 1.40* 1.23* 1.54*  CALCIUM 8.0*  --  7.8* 7.9* 7.7* 8.1*   GFR: Estimated Creatinine Clearance: 79.8 mL/min (by C-G formula based on SCr of 1.54 mg/dL (H)). Liver Function Tests: No results for input(s): AST, ALT, ALKPHOS, BILITOT, PROT, ALBUMIN in the last 168 hours. No results for input(s): LIPASE, AMYLASE in the last 168 hours. No results for input(s): AMMONIA in the last 168 hours. Coagulation Profile: No results for input(s): INR, PROTIME  in the last 168 hours. Cardiac Enzymes: No results for input(s): CKTOTAL, CKMB, CKMBINDEX, TROPONINI in the last 168 hours. BNP (last 3 results) No results for input(s): PROBNP in the last 8760 hours. HbA1C: No results for input(s): HGBA1C in the last 72 hours. CBG: No results for input(s): GLUCAP in the last 168 hours. Lipid Profile: No results for input(s): CHOL, HDL, LDLCALC, TRIG, CHOLHDL, LDLDIRECT in the last 72 hours. Thyroid Function Tests: No results for input(s): TSH, T4TOTAL, FREET4, T3FREE, THYROIDAB in the last 72 hours. Anemia Panel: No results for input(s): VITAMINB12, FOLATE, FERRITIN, TIBC, IRON, RETICCTPCT in the last 72 hours. Urine analysis:    Component Value Date/Time   COLORURINE YELLOW 12/01/2014 2240   APPEARANCEUR CLEAR  12/01/2014 2240   LABSPEC >1.030 (H) 12/01/2014 2240   PHURINE 5.0 12/01/2014 2240   GLUCOSEU NEGATIVE 12/01/2014 2240   GLUCOSEU NEG mg/dL 16/12/9602 5409   HGBUR NEGATIVE 12/01/2014 2240   HGBUR negative 12/05/2009 1003   BILIRUBINUR NEGATIVE 12/01/2014 2240   BILIRUBINUR small 07/31/2010 1713   KETONESUR NEGATIVE 12/01/2014 2240   PROTEINUR NEGATIVE 12/01/2014 2240   UROBILINOGEN 0.2 12/01/2014 2240   NITRITE NEGATIVE 12/01/2014 2240   LEUKOCYTESUR NEGATIVE 12/01/2014 2240   Sepsis Labs: @LABRCNTIP (procalcitonin:4,lacticidven:4)  ) Recent Results (from the past 240 hour(s))  Respiratory Panel by PCR     Status: None   Collection Time: 12/14/15  6:11 AM  Result Value Ref Range Status   Adenovirus NOT DETECTED NOT DETECTED Final   Coronavirus 229E NOT DETECTED NOT DETECTED Final   Coronavirus HKU1 NOT DETECTED NOT DETECTED Final   Coronavirus NL63 NOT DETECTED NOT DETECTED Final   Coronavirus OC43 NOT DETECTED NOT DETECTED Final   Metapneumovirus NOT DETECTED NOT DETECTED Final   Rhinovirus / Enterovirus NOT DETECTED NOT DETECTED Final   Influenza A NOT DETECTED NOT DETECTED Final   Influenza B NOT DETECTED NOT DETECTED  Final   Parainfluenza Virus 1 NOT DETECTED NOT DETECTED Final   Parainfluenza Virus 2 NOT DETECTED NOT DETECTED Final   Parainfluenza Virus 3 NOT DETECTED NOT DETECTED Final   Parainfluenza Virus 4 NOT DETECTED NOT DETECTED Final   Respiratory Syncytial Virus NOT DETECTED NOT DETECTED Final   Bordetella pertussis NOT DETECTED NOT DETECTED Final   Chlamydophila pneumoniae NOT DETECTED NOT DETECTED Final   Mycoplasma pneumoniae NOT DETECTED NOT DETECTED Final    Comment: Performed at [facility]  MRSA PCR Screening     Status: Abnormal   Collection Time: 12/14/15  6:11 AM  Result Value Ref Range Status   MRSA by PCR POSITIVE (A) NEGATIVE Final    Comment:        The GeneXpert MRSA Assay (FDA approved for NASAL specimens only), is one component of a comprehensive MRSA colonization surveillance program. It is not intended to diagnose MRSA infection nor to guide or monitor treatment for MRSA infections. RESULT CALLED TO, READ BACK BY AND VERIFIED WITH: Shari Heritage, RN AT 11:53 12/14/15 BY Rito Ehrlich       Radiology Studies: Dg Chest 2 View  Result Date: 12/22/2015 CLINICAL DATA:  Shortness of breath. EXAM: CHEST  2 VIEW COMPARISON:  Radiograph of December 19, 2015. FINDINGS: Stable cardiomegaly. No pneumothorax or pleural effusion is noted. Left lung appears clear. Right basilar subsegmental atelectasis or infiltrate is noted. Osteophyte formation is seen in the thoracic spine. IMPRESSION: Right basilar subsegmental atelectasis or infiltrate. Electronically Signed   By: Lupita Raider, M.D.   On: 12/22/2015 14:59     Scheduled Meds: . allopurinol  100 mg Oral q morning - 10a  . aspirin  325 mg Oral q morning - 10a  . atorvastatin  20 mg Oral q morning - 10a  . benzonatate  200 mg Oral TID  . darifenacin  7.5 mg Oral Daily  . dextromethorphan-guaiFENesin  1 tablet Oral BID  . escitalopram  10 mg Oral Daily  . folic acid  1 mg Oral Daily  . furosemide  20 mg Oral Daily    . gabapentin  100 mg Oral BID  . heparin subcutaneous  5,000 Units Subcutaneous Q8H  . indomethacin  75 mg Oral BID WC  . ipratropium-albuterol  3 mL Nebulization TID  . isosorbide mononitrate  30 mg Oral Daily  . levofloxacin (LEVAQUIN) IV  750 mg Intravenous Q24H  . lubriderm seriously sensitive  1 application Topical TID  . omega-3 acid ethyl esters  1 g Oral Daily  . pantoprazole  40 mg Oral q morning - 10a  . predniSONE  20 mg Oral Q breakfast  . topiramate  100 mg Oral BID  . vitamin E  100 Units Oral Daily   Continuous Infusions:     LOS: 8 days   Time Spent in minutes   30 minutes  Vernadette Stutsman MD. on 12/22/2015 at 5:11 PM  Between 7am to 7pm - Pager - [phone]  After 7pm go to [email or web address] - password [email or web address]  And look for the night coverage person covering for me after hours  [facility] Office  [phone]

## 2015-12-23 LAB — CBC
HCT: 36.4 % (ref 36.0–46.0)
Hemoglobin: 11 g/dL — ABNORMAL LOW (ref 12.0–15.0)
MCH: 28.9 pg (ref 26.0–34.0)
MCHC: 30.2 g/dL (ref 30.0–36.0)
MCV: 95.8 fL (ref 78.0–100.0)
PLATELETS: 175 10*3/uL (ref 150–400)
RBC: 3.8 MIL/uL — AB (ref 3.87–5.11)
RDW: 14.9 % (ref 11.5–15.5)
WBC: 14.1 10*3/uL — AB (ref 4.0–10.5)

## 2015-12-23 LAB — BASIC METABOLIC PANEL
ANION GAP: 7 (ref 5–15)
BUN: 44 mg/dL — ABNORMAL HIGH (ref 6–20)
CALCIUM: 8.1 mg/dL — AB (ref 8.9–10.3)
CO2: 33 mmol/L — ABNORMAL HIGH (ref 22–32)
Chloride: 100 mmol/L — ABNORMAL LOW (ref 101–111)
Creatinine, Ser: 1.46 mg/dL — ABNORMAL HIGH (ref 0.44–1.00)
GFR, EST AFRICAN AMERICAN: 47 mL/min — AB (ref >60)
GFR, EST NON AFRICAN AMERICAN: 41 mL/min — AB (ref >60)
Glucose, Bld: 112 mg/dL — ABNORMAL HIGH (ref 65–99)
POTASSIUM: 4.7 mmol/L (ref 3.5–5.1)
SODIUM: 140 mmol/L (ref 135–145)

## 2015-12-23 NOTE — Progress Notes (Signed)
PROGRESS NOTE    Kara Vincent  MWU:[id number] DOB: March 07, 1966 DOA: 12/13/2015 PCP: No PCP Per Patient   Chief Complaint  Patient presents with  . Shortness of Breath  . Chest Pain    Brief Narrative:  HPI on 12/14/2015 by Dr. Lorretta Harp Kara Vincent is a 50 y.o. female with medical history significant of hypertension, hyperlipidemia, asthma, OSA on CPAP, morbid obesity, right heart failure, diastolic congestive heart failure, drug abuse, Darer's disease, wheelchair-bound, CKD-III, GERD, gout, anxiety, who presents with shortness of breath, chest pain, leg edema, cough.  Patient states that she has been having shortness of breath, chest pain, cough for more than one week. Her chest pain is located in left lower chest. It is constant, 8 out of 10 in severity, nonradiating, pleuritic, aggravated by coughing and deep breath. He coughs up yellow colored sputum. No fever or chills. He has bilateral lower leg pain, but it not localized to calf areas. He has bilateral lower leg edema. Denies nausea, vomiting, abdominal pain, diarrhea, symptoms of UTI. She is wheelchair bounded, no unilateral weakness, vision change or hearing loss.     Assessment & Plan   Acute on chronic respiratory failure with hypoxia/Cough -Likely multifactorial including CHF vs asthma exacerbation and non compliance to CPAP and new onset right sided pneumonia.  -VQ scan showed low probability of pulmonary embolus. LE doppler showed no evidence of DVT or SVT - currently back on [place] oxygen and on CPAP at night.  CXR shows right lower infiltrate.  Started her on levaquin. Would continue to complete the course.   Asthma exacerbation No  Wheezing.  On tapering steroids.  -Respiratory viral panel unremarkable   Acute on chronic diastolic heart failure -Echocardiogram: EF 60-65%, grade 1 diastolic dysfunction -Monitor intake and output, daily weights - at home oral lasix, resume 20 mg of daiy lasix.  - CXR repeated  on 9/27 shows persistent vascular congestion. .  - CXR ON 9/30 shows right lower lobe infiltrate.  I/O last 3 completed shifts: In: 1110 [P.O.:960; IV Piggyback:150] Out: 2000 [Urine:2000] Total I/O In: 1065 [P.O.:1065] Out: -      Essential hypertension - well controlled.   Acute on Chronic kidney disease, stage III -Baseline creatinine approximately 1.2-1.3, currently 1.46.  Obstructive sleep apnea Very non compliant.  -Continue CPAP QHS -Patient stated she lost or displaced her CPAP back in 2013.  -Spoke to case management , will schedule for outpatient CPAP.    GERD -Continue PPI  Gout - no flare up  -Continue allopurinol and indomethacin  Hyperlipidemia -Continue Lipitor  Chest pain -Likely pleuritic -Troponin unremarkable -Echocardiogram as above -Continue aspirin, statin, Imdur  - resolved.   Depression/anxiety -Continue Lexapro, Xanax PRN  Headache -  Improved. -States she takes pain medicine for this, suspect its probably from being non compliant to CPAP, her pain seems to get better with compliance.  -Defer to outpatient workup and follow up -Continue pain control -CT head unremarkable, normal  DVT Prophylaxis  heparin   Code Status: Full  Family Communication: None at bedside  Disposition Plan: pending , possibly in 1 to 2 days.   Consultants None  Procedures  Echocardiogram Lower extremity Doppler VQ scan  Antibiotics   Anti-infectives    Start     Dose/Rate Route Frequency Ordered Stop   12/22/15 1600  levofloxacin (LEVAQUIN) IVPB 750 mg     750 mg 100 mL/hr over 90 Minutes Intravenous Every 24 hours 12/22/15 1552  Subjective:   Kara Vincent seen and examined today. Alert and oriented.  Wants to be discharged tomorrow. While we get all the equipment delivered to her place tomorrow.   Objective:   Vitals:   12/23/15 0500 12/23/15 0859 12/23/15 1300 12/23/15 1410  BP: (!) 124/57   131/60  Pulse: 68   60  Resp:  16   16  Temp: 98.1 F (36.7 C)   97.9 F (36.6 C)  TempSrc: Oral   Oral  SpO2: 97% 97% 97% 97%  Weight:    (!) 207.4 kg (457 lb 3.7 oz)  Height:        Intake/Output Summary (Last 24 hours) at 12/23/15 1658 Last data filed at 12/23/15 1606  Gross per 24 hour  Intake             1425 ml  Output             1650 ml  Net             -225 ml   Filed Weights   12/20/15 2100 12/21/15 0126 12/23/15 1410  Weight: (!) 207 kg (456 lb 5.6 oz) (!) 207 kg (456 lb 5.6 oz) (!) 207.4 kg (457 lb 3.7 oz)    Exam  General: Well developed, well nourished, chronically ill, morbidly obese  HEENT: NCAT, mucous membranes moist.   Cardiovascular: S1 S2 auscultated, ?1/6SEM, RRR  Respiratory: Diminished breath sounds, no wheezing .  Appears comfortable, no increased work of breathing,   Abdomen: Soft, Morbidly obese, nontender, nondistended, + bowel sounds  Extremities: warm dry without cyanosis clubbing or edema  Neuro: AAOx3, Nonfocal    Data Reviewed: I have personally reviewed following labs and imaging studies  CBC:  Recent Labs Lab 12/23/15 0408  WBC 14.1*  HGB 11.0*  HCT 36.4  MCV 95.8  PLT 175   Basic Metabolic Panel:  Recent Labs Lab 12/18/15 0506 12/19/15 0514 12/20/15 0509 12/21/15 0936 12/23/15 0408  NA 139 141 140 139 140  K 4.6 5.5* 5.0 4.3 4.7  CL 97* 100* 99* 95* 100*  CO2 35* 35* 34* 38* 33*  GLUCOSE 113* 101* 90 154* 112*  BUN 62* 52* 43* 42* 44*  CREATININE 1.93* 1.40* 1.23* 1.54* 1.46*  CALCIUM 7.8* 7.9* 7.7* 8.1* 8.1*   GFR: Estimated Creatinine Clearance: 84.3 mL/min (by C-G formula based on SCr of 1.46 mg/dL (H)). Liver Function Tests: No results for input(s): AST, ALT, ALKPHOS, BILITOT, PROT, ALBUMIN in the last 168 hours. No results for input(s): LIPASE, AMYLASE in the last 168 hours. No results for input(s): AMMONIA in the last 168 hours. Coagulation Profile: No results for input(s): INR, PROTIME in the last 168 hours. Cardiac  Enzymes: No results for input(s): CKTOTAL, CKMB, CKMBINDEX, TROPONINI in the last 168 hours. BNP (last 3 results) No results for input(s): PROBNP in the last 8760 hours. HbA1C: No results for input(s): HGBA1C in the last 72 hours. CBG: No results for input(s): GLUCAP in the last 168 hours. Lipid Profile: No results for input(s): CHOL, HDL, LDLCALC, TRIG, CHOLHDL, LDLDIRECT in the last 72 hours. Thyroid Function Tests: No results for input(s): TSH, T4TOTAL, FREET4, T3FREE, THYROIDAB in the last 72 hours. Anemia Panel: No results for input(s): VITAMINB12, FOLATE, FERRITIN, TIBC, IRON, RETICCTPCT in the last 72 hours. Urine analysis:    Component Value Date/Time   COLORURINE YELLOW 12/01/2014 2240   APPEARANCEUR CLEAR 12/01/2014 2240   LABSPEC >1.030 (H) 12/01/2014 2240   PHURINE 5.0 12/01/2014 2240  GLUCOSEU NEGATIVE 12/01/2014 2240   GLUCOSEU NEG mg/dL 29/51/8841 6606   HGBUR NEGATIVE 12/01/2014 2240   HGBUR negative 12/05/2009 1003   BILIRUBINUR NEGATIVE 12/01/2014 2240   BILIRUBINUR small 07/31/2010 1713   KETONESUR NEGATIVE 12/01/2014 2240   PROTEINUR NEGATIVE 12/01/2014 2240   UROBILINOGEN 0.2 12/01/2014 2240   NITRITE NEGATIVE 12/01/2014 2240   LEUKOCYTESUR NEGATIVE 12/01/2014 2240   Sepsis Labs: @LABRCNTIP (procalcitonin:4,lacticidven:4)  ) Recent Results (from the past 240 hour(s))  Respiratory Panel by PCR     Status: None   Collection Time: 12/14/15  6:11 AM  Result Value Ref Range Status   Adenovirus NOT DETECTED NOT DETECTED Final   Coronavirus 229E NOT DETECTED NOT DETECTED Final   Coronavirus HKU1 NOT DETECTED NOT DETECTED Final   Coronavirus NL63 NOT DETECTED NOT DETECTED Final   Coronavirus OC43 NOT DETECTED NOT DETECTED Final   Metapneumovirus NOT DETECTED NOT DETECTED Final   Rhinovirus / Enterovirus NOT DETECTED NOT DETECTED Final   Influenza A NOT DETECTED NOT DETECTED Final   Influenza B NOT DETECTED NOT DETECTED Final   Parainfluenza Virus 1 NOT  DETECTED NOT DETECTED Final   Parainfluenza Virus 2 NOT DETECTED NOT DETECTED Final   Parainfluenza Virus 3 NOT DETECTED NOT DETECTED Final   Parainfluenza Virus 4 NOT DETECTED NOT DETECTED Final   Respiratory Syncytial Virus NOT DETECTED NOT DETECTED Final   Bordetella pertussis NOT DETECTED NOT DETECTED Final   Chlamydophila pneumoniae NOT DETECTED NOT DETECTED Final   Mycoplasma pneumoniae NOT DETECTED NOT DETECTED Final    Comment: Performed at [facility]  MRSA PCR Screening     Status: Abnormal   Collection Time: 12/14/15  6:11 AM  Result Value Ref Range Status   MRSA by PCR POSITIVE (A) NEGATIVE Final    Comment:        The GeneXpert MRSA Assay (FDA approved for NASAL specimens only), is one component of a comprehensive MRSA colonization surveillance program. It is not intended to diagnose MRSA infection nor to guide or monitor treatment for MRSA infections. RESULT CALLED TO, READ BACK BY AND VERIFIED WITH: Shari Heritage, RN AT 11:53 12/14/15 BY Rito Ehrlich       Radiology Studies: Dg Chest 2 View  Result Date: 12/22/2015 CLINICAL DATA:  Shortness of breath. EXAM: CHEST  2 VIEW COMPARISON:  Radiograph of December 19, 2015. FINDINGS: Stable cardiomegaly. No pneumothorax or pleural effusion is noted. Left lung appears clear. Right basilar subsegmental atelectasis or infiltrate is noted. Osteophyte formation is seen in the thoracic spine. IMPRESSION: Right basilar subsegmental atelectasis or infiltrate. Electronically Signed   By: Lupita Raider, M.D.   On: 12/22/2015 14:59     Scheduled Meds: . allopurinol  100 mg Oral q morning - 10a  . aspirin  325 mg Oral q morning - 10a  . atorvastatin  20 mg Oral q morning - 10a  . benzonatate  200 mg Oral TID  . darifenacin  7.5 mg Oral Daily  . dextromethorphan-guaiFENesin  1 tablet Oral BID  . escitalopram  10 mg Oral Daily  . folic acid  1 mg Oral Daily  . furosemide  20 mg Oral Daily  . gabapentin  100 mg Oral BID  .  heparin subcutaneous  5,000 Units Subcutaneous Q8H  . indomethacin  75 mg Oral BID WC  . ipratropium-albuterol  3 mL Nebulization TID  . isosorbide mononitrate  30 mg Oral Daily  . levofloxacin (LEVAQUIN) IV  750 mg Intravenous Q24H  . lubriderm  seriously sensitive  1 application Topical TID  . omega-3 acid ethyl esters  1 g Oral Daily  . pantoprazole  40 mg Oral q morning - 10a  . predniSONE  20 mg Oral Q breakfast  . topiramate  100 mg Oral BID  . vitamin E  100 Units Oral Daily   Continuous Infusions:     LOS: 9 days   Time Spent in minutes   30 minutes  Asante Ritacco MD. on 12/23/2015 at 4:58 PM  Between 7am to 7pm - Pager - [phone]  After 7pm go to [email or web address] - password [email or web address]  And look for the night coverage person covering for me after hours  [facility] Office  [phone]

## 2015-12-23 NOTE — Care Management Note (Addendum)
Case Management Note  Patient Details  Name: Kara Vincent MRN: [id number] Date of Birth: 1965-04-21  Subjective/Objective:   CHF, acute asthma exacerbation                 Action/Plan: Discharge Planning:  NCM spoke to pt and states she lives at [facility] with boyfriend. She was living at [facility] but was asked to move out.  Pt's scooter is in the room. Has oxygen at home with [facility]. Pt will need CPAP and neb machine for home. Will have NCM arrange [facility] on 12/24/2015. Once arrange [facility] will deliver a CPAP to home post dc.   Pt states [place] [employer] for [place] to her appts.   Expected Discharge Date:              Expected Discharge Plan:  Home/Self Care  In-House Referral:  Clinical Social Work  Discharge planning Services  CM Consult (pt asked to change PCP with Medicaid. instruction given with list of PCP's that take Medicaid)  Post Acute Care Choice:  Home Health Choice offered to:  Patient  DME Arranged:  Continuous positive airway pressure (CPAP) DME Agency:  [place].   Status of Service:  In process, will continue to follow  If discussed at Long Length of Stay Meetings, dates discussed:    Additional Comments:  Erenest Rasher, RN 12/23/2015, 4:09 PM

## 2015-12-23 NOTE — Progress Notes (Deleted)
PHARMACIST - PHYSICIAN COMMUNICATION DR: Karleen Hampshire CONCERNING: Antibiotic IV to Oral Route Change Policy  RECOMMENDATION: This patient is receiving Levaquin by the intravenous route.  Based on criteria approved by the Pharmacy and Therapeutics Committee, the antibiotic(s) is/are being converted to the equivalent oral dose form(s).   DESCRIPTION: These criteria include:  Patient being treated for a respiratory tract infection, urinary tract infection, cellulitis or clostridium difficile associated diarrhea if on metronidazole  The patient is not neutropenic and does not exhibit a GI malabsorption state  The patient is eating (either orally or via tube) and/or has been taking other orally administered medications for a least 24 hours  The patient is improving clinically and has a Tmax < 100.5  If you have questions about this conversion, please contact the Pharmacy Department  []   [phone] )  [facility] []   [phone] )  [facility] []   [phone] )  [facility] []   [phone] )  [facility] [x]   [phone] )  [place], PharmD, pager [phone]. 12/23/2015,10:29 AM.

## 2015-12-24 MED ORDER — FUROSEMIDE 20 MG PO TABS: 20.0000 mg | ORAL_TABLET | Freq: Every day | ORAL | 0 refills | Status: DC

## 2015-12-24 MED ORDER — LEVOFLOXACIN 750 MG PO TABS: 750.0000 mg | ORAL_TABLET | Freq: Every day | ORAL | Status: DC

## 2015-12-24 MED ORDER — LEVOFLOXACIN 750 MG PO TABS: 750.0000 mg | ORAL_TABLET | Freq: Every day | ORAL | 0 refills | Status: DC

## 2015-12-24 MED ORDER — IPRATROPIUM-ALBUTEROL 0.5-2.5 (3) MG/3ML IN SOLN: 3.0000 mL | Freq: Four times a day (QID) | RESPIRATORY_TRACT | 1 refills | Status: DC | PRN

## 2015-12-24 MED ORDER — HYDROCODONE-ACETAMINOPHEN 10-325 MG PO TABS: 1.0000 | ORAL_TABLET | ORAL | 0 refills | Status: DC | PRN

## 2015-12-24 MED ORDER — LISINOPRIL 40 MG PO TABS: ORAL_TABLET | ORAL | Status: DC

## 2015-12-24 NOTE — Progress Notes (Signed)
Occupational Therapy Treatment Patient Details Name: Kara Vincent MRN: [id number] DOB: 01-Apr-1965 Today's Date: 12/24/2015    History of present illness 50 yo female admitted with SOB respiratory failure with hypoxia. pt noted to have Asthma exacerbation and acute on chronic CHF. PMH: CHF, chronic pain, OSA, morbid obesity   OT comments  Pt fartigued but agreed to BUE exercise. Explained importance of BUE exercise for all ADL activity and transfers  Follow Up Recommendations  No OT follow up    Equipment Recommendations  3 in 1 bedside comode;Tub/shower seat    Recommendations for Other Services      Precautions / Restrictions Precautions Precautions: Fall Precaution Comments: chronic 3L O2       Mobility Bed Mobility Overal bed mobility: Modified Independent                Transfers   Equipment used: Rolling walker (2 wheeled)   Sit to Stand: Modified independent (Device/Increase time)                  ADL                                         General ADL Comments: Pt agreed to to [employer], Goal added                Cognition   Behavior During Therapy: [facility] for tasks assessed/performed Overall Cognitive Status: Within Functional Limits for tasks assessed                         Exercises General Exercises - Upper Extremity Shoulder Flexion: AROM;20 reps;Both Shoulder ABduction: AROM;20 reps;Both Elbow Flexion: AROM;Both;20 reps Elbow Extension: AROM;20 reps;Both   Shoulder Instructions       General Comments      Pertinent Vitals/ Pain       Pain Assessment: No/denies pain         Frequency  Min 2X/week        Progress Toward Goals  OT Goals(current goals can now be found in the care plan section)  Progress towards OT goals: Progressing toward goals  ADL Goals Pt/caregiver will Perform Home Exercise Program: Increased strength;Increased ROM;Both right and left upper  extremity;Independently;With written HEP provided  Plan Discharge plan remains appropriate    Co-evaluation                 End of Session Equipment Utilized During Treatment: Oxygen   Activity Tolerance Patient tolerated treatment well   Patient Left in bed;with call bell/phone within reach   Nurse Communication          Time: 6599-3570 OT Time Calculation (min): 11 min  Charges: OT General Charges $OT Visit: 1 Procedure OT Treatments $Therapeutic Exercise: 8-22 mins  Favio Moder, Edwena Felty D 12/24/2015, 1:42 PM

## 2015-12-24 NOTE — Progress Notes (Signed)
Physical Therapy Treatment Patient Details Name: Kara Vincent MRN: [id number] DOB: April 11, 1965 Today's Date: 12/24/2015    History of Present Illness 50 yo female admitted with SOB respiratory failure with hypoxia. pt noted to have Asthma exacerbation and acute on chronic CHF. PMH: CHF, chronic pain, OSA, morbid obesity    PT Comments    Pt progressing with mobility, she tolerated increased distance of 89' with ambulation. SaO2 88% on 3L O2, 91% on 4L O2 while walking.   Follow Up Recommendations  No PT follow up     Equipment Recommendations  Rolling walker with 5" wheels (wide RW)    Recommendations for Other Services       Precautions / Restrictions Precautions Precautions: Fall Precaution Comments: chronic 3L O2, 1 fall in past 1 year Restrictions Weight Bearing Restrictions: No    Mobility  Bed Mobility Overal bed mobility: Modified Independent             General bed mobility comments: no physical assist, used bedrail  Transfers Overall transfer level: Modified independent Equipment used: Rolling walker (2 wheeled) Transfers: Sit to/from Stand Sit to Stand: Modified independent (Device/Increase time)         General transfer comment: MOD I with RW  Ambulation/Gait Ambulation/Gait assistance: Supervision Ambulation Distance (Feet): 75 Feet Assistive device: Rolling walker (2 wheeled) Gait Pattern/deviations: Step-through pattern Gait velocity: decr Gait velocity interpretation: Below normal speed for age/gender General Gait Details: 88% on 3L O2, 91% on 4L O2 with walking, HR 124, 1/4 dyspnea   Stairs            Wheelchair Mobility    Modified Rankin (Stroke Patients Only)       Balance     Sitting balance-Leahy Scale: Good       Standing balance-Leahy Scale: Fair                      Cognition Arousal/Alertness: Awake/alert Behavior During Therapy: WFL for tasks assessed/performed Overall Cognitive Status: Within  Functional Limits for tasks assessed                      Exercises General Exercises - Upper Extremity Shoulder Flexion: AROM;20 reps;Both Shoulder ABduction: AROM;20 reps;Both Elbow Flexion: AROM;Both;20 reps Elbow Extension: AROM;20 reps;Both    General Comments        Pertinent Vitals/Pain Pain Assessment: 0-10 Pain Score: 8  Pain Location: "lungs and head" Pain Descriptors / Indicators: Headache;Aching Pain Intervention(s): Limited activity within patient's tolerance;Monitored during session;Patient requesting pain meds-RN notified    Home Living                      Prior Function            PT Goals (current goals can now be found in the care plan section) Acute Rehab PT Goals Patient Stated Goal: none stated by patient PT Goal Formulation: With patient Time For Goal Achievement: 01/07/16 Potential to Achieve Goals: Good Progress towards PT goals: Progressing toward goals    Frequency    Min 3X/week      PT Plan Current plan remains appropriate    Co-evaluation             End of Session Equipment Utilized During Treatment: Oxygen Activity Tolerance: Patient tolerated treatment well Patient left: with call bell/phone within reach;in bed     Time: 6384-6659 PT Time Calculation (min) (ACUTE ONLY): 18 min  Charges:  $Gait Training:  8-22 mins                    G Codes:      Philomena Doheny 12/24/2015, 1:49 PM [phone]

## 2015-12-24 NOTE — Progress Notes (Signed)
Patient discharged to home, all discharge medications and instructions reviewed and questions answered.  Patient to ride personal electrical wheelchair to [place] bus stop.

## 2015-12-24 NOTE — Discharge Summary (Signed)
Physician Discharge Summary  Kara Vincent GNF:[id number] DOB: 11-Jan-1966 DOA: 12/13/2015  PCP: No PCP Per Patient  Admit date: 12/13/2015 Discharge date: 12/24/2015  Admitted From: Home  Disposition:  HOme.   Recommendations for Outpatient Follow-up:  1. Follow up with PCP in 1-2 weeks 2. Please obtain BMP/CBC in one week 3. Please follow up  With sleep study.   Home Health:Yes Equipment/Devices: oxygen and hospital bed.   Discharge Condition:guarded.  CODE STATUS:full code.  Diet recommendation: Heart Healthy / Carb Modified   Brief/Interim Summary: Kara Vincent a 50 y.o.femalewith medical history significant of hypertension, hyperlipidemia, asthma, OSA on CPAP, morbid obesity, right heart failure, diastolic congestive heart failure, drug abuse, Darer's disease, wheelchair-bound, CKD-III, GERD, gout, anxiety,  presents with shortness of breath, chest pain, leg edema, cough.    Discharge Diagnoses:  Principal Problem:   Acute on chronic respiratory failure with hypoxia (HCC) Active Problems:   Morbid obesity (HCC)   Asthma exacerbation   CKD (chronic kidney disease) stage 3, GFR 30-59 ml/min   Right-sided heart failure   OSA (obstructive sleep apnea)   Darier's disease   Essential hypertension   HLD (hyperlipidemia)   GERD (gastroesophageal reflux disease)   Gout   Acute on chronic diastolic (congestive) heart failure   Chest pain  Acute on chronic respiratory failure with hypoxia/Cough -Likely multifactorial including CHF vs asthma exacerbation and non compliance to CPAP and new onset right sided pneumonia.  -VQ scan showed low probability of pulmonary embolus. LE doppler showed no evidence of DVT or SVT - currently back on [place] oxygen and on CPAP at night.  CXR shows right lower infiltrate.  Started her on levaquin. Would continue to complete the course.   Asthma exacerbation No  Wheezing.  On tapering steroids.  -Respiratory viral panel  unremarkable   Acute on chronic diastolic heart failure -Echocardiogram: EF 84-69%, grade 1 diastolic dysfunction -Monitor intake and output, daily weights - at home oral lasix, resume 20 mg of daiy lasix.  - CXR repeated on 9/27 shows persistent vascular congestion. .  - CXR ON 9/30 shows right lower lobe infiltrate was started on levaquin , discharged on the same to complete the course.     Essential hypertension - well controlled.   Acute on Chronic kidney disease, stage III -Baseline creatinine approximately 1.2-1.3, currently 1.46.  Obstructive sleep apnea Very non compliant.  -Continue CPAP QHS -Patient stated she lost or displaced her CPAP back in 2013.  -Spoke to case management , will schedule for outpatient CPAP.    GERD -Continue PPI  Gout - no flare up  -Continue allopurinol and indomethacin  Hyperlipidemia -Continue Lipitor  Chest pain -Likely pleuritic -Troponin unremarkable -Echocardiogram as above -Continue aspirin, statin, Imdur  - resolved.   Depression/anxiety -Continue Lexapro, Xanax PRN  Headache -  Improved. -States she takes pain medicine for this, suspect its probably from being non compliant to CPAP, her pain seems to get better with compliance.  -Defer to outpatient workup and follow up -Continue pain control -CT head unremarkable, normal   Discharge Instructions  Discharge Instructions    Diet - low sodium heart healthy    Complete by:  As directed    Discharge instructions    Complete by:  As directed    Please follow up with PCP in one week.       Medication List    STOP taking these medications   cloNIDine 0.2 MG tablet Commonly known as:  CATAPRES   diclofenac  75 MG EC tablet Commonly known as:  VOLTAREN   mometasone-formoterol 100-5 MCG/ACT Aero Commonly known as:  DULERA   naproxen 500 MG tablet Commonly known as:  NAPROSYN   predniSONE 10 MG tablet Commonly known as:  DELTASONE      TAKE these medications   albuterol (2.5 MG/3ML) 0.083% nebulizer solution Commonly known as:  PROVENTIL Take 2.5 mg by nebulization every 6 (six) hours as needed for wheezing or shortness of breath.   allopurinol 100 MG tablet Commonly known as:  ZYLOPRIM Take 1 tablet (100 mg total) by mouth every morning.   ALPRAZolam 1 MG tablet Commonly known as:  XANAX Take 1 mg by mouth 3 (three) times daily as needed for anxiety (anxiety). What changed:  Another medication with the same name was removed. Continue taking this medication, and follow the directions you see here.   ammonium lactate 12 % cream Commonly known as:  AMLACTIN Apply 1 g topically daily as needed for dry skin.   aspirin 325 MG tablet Take 325 mg by mouth every morning.   atorvastatin 20 MG tablet Commonly known as:  LIPITOR Take 1 tablet (20 mg total) by mouth every morning.   benzonatate 100 MG capsule Commonly known as:  TESSALON Take 1 capsule (100 mg total) by mouth 3 (three) times daily as needed for cough.   budesonide-formoterol 160-4.5 MCG/ACT inhaler Commonly known as:  SYMBICORT Inhale 2 puffs into the lungs 2 (two) times daily.   butalbital-acetaminophen-caffeine 50-325-40 MG tablet Commonly known as:  FIORICET, ESGIC Take 1 tablet by mouth every 6 (six) hours as needed for headache.   dextromethorphan-guaiFENesin 30-600 MG 12hr tablet Commonly known as:  MUCINEX DM Take 1 tablet by mouth 2 (two) times daily.   escitalopram 10 MG tablet Commonly known as:  LEXAPRO Take 10 mg by mouth daily.   eucerin lotion Apply 5 mLs topically 2 (two) times daily as needed for dry skin. What changed:  when to take this   FISH OIL PO Take 1 capsule by mouth 2 (two) times daily.   folic acid 1 MG tablet Commonly known as:  FOLVITE Take 1 tablet (1 mg total) by mouth daily.   furosemide 20 MG tablet Commonly known as:  LASIX Take 1 tablet (20 mg total) by mouth daily. Start taking on:   12/25/2015 What changed:  how much to take  when to take this  reasons to take this   gabapentin 100 MG capsule Commonly known as:  NEURONTIN Take 1 capsule (100 mg total) by mouth 2 (two) times daily.   HYDROcodone-acetaminophen 10-325 MG tablet Commonly known as:  NORCO Take 1 tablet by mouth every 4 (four) hours as needed for moderate pain.   hydrOXYzine 25 MG tablet Commonly known as:  ATARAX/VISTARIL Take 25 mg by mouth 3 (three) times daily as needed for anxiety or itching (anxiety and itching).   indomethacin 75 MG CR capsule Commonly known as:  INDOCIN SR Take 75 mg by mouth 2 (two) times daily with a meal.   ipratropium-albuterol 0.5-2.5 (3) MG/3ML Soln Commonly known as:  DUONEB Take 3 mLs by nebulization every 6 (six) hours as needed.   isosorbide mononitrate 30 MG 24 hr tablet Commonly known as:  IMDUR Take 30 mg by mouth daily.   levofloxacin 750 MG tablet Commonly known as:  LEVAQUIN Take 1 tablet (750 mg total) by mouth daily before supper.   lisinopril 40 MG tablet Commonly known as:  PRINIVIL,ZESTRIL Hold medication for one  week, follow up with PCP , check renal function, if its back to baseline, can start taking it. What changed:  how much to take  how to take this  when to take this  additional instructions   methocarbamol 500 MG tablet Commonly known as:  ROBAXIN Take 1 tablet (500 mg total) by mouth every 8 (eight) hours as needed for muscle spasms.   pantoprazole 40 MG tablet Commonly known as:  PROTONIX Take 1 tablet (40 mg total) by mouth every morning.   solifenacin 5 MG tablet Commonly known as:  VESICARE Take 5 mg by mouth every morning.   topiramate 100 MG tablet Commonly known as:  TOPAMAX Take 100 mg by mouth 2 (two) times daily. What changed:  Another medication with the same name was removed. Continue taking this medication, and follow the directions you see here.   VITAMIN E PO Take 1 capsule by mouth daily.        Allergies  Allergen Reactions  . Ibuprofen Hives, Shortness Of Breath and Palpitations    Has tolerated toradol 12/2013 inpatient  . Penicillins Anaphylaxis and Hives    Has patient had a PCN reaction causing immediate rash, facial/tongue/throat swelling, SOB or lightheadedness with hypotension: Yes Has patient had a PCN reaction causing severe rash involving mucus membranes or skin necrosis: Yes Has patient had a PCN reaction that required hospitalization Unknown Has patient had a PCN reaction occurring within the last 10 years: Unknown If all of the above answers are "NO", then may proceed with Cephalosporin use.   . Sulfa Antibiotics Hives, Shortness Of Breath and Cough  . Doxycycline Hives and Swelling  . Ibuprofen Hives and Swelling  . Zithromax [Azithromycin] Hives and Swelling  . Adhesive [Tape] Rash  . Adhesive [Tape] Rash  . Azithromycin Hives and Rash  . Doxycycline Rash  . Penicillins Rash    Has patient had a PCN reaction causing immediate rash, facial/tongue/throat swelling, SOB or lightheadedness with hypotension:Yes Has patient had a PCN reaction causing severe rash involving mucus membranes or skin necrosis:Yes Has patient had a PCN reaction that required hospitalization Yes Has patient had a PCN reaction occurring within the last 10 years:No If all of the above answers are "NO", then may proceed with Cephalosporin use.   . Sulfa Antibiotics Rash  . Sulfamethoxazole-Trimethoprim Rash    Consultations:  none    Procedures/Studies: Dg Chest 2 View  Result Date: 12/22/2015 CLINICAL DATA:  Shortness of breath. EXAM: CHEST  2 VIEW COMPARISON:  Radiograph of December 19, 2015. FINDINGS: Stable cardiomegaly. No pneumothorax or pleural effusion is noted. Left lung appears clear. Right basilar subsegmental atelectasis or infiltrate is noted. Osteophyte formation is seen in the thoracic spine. IMPRESSION: Right basilar subsegmental atelectasis or infiltrate.  Electronically Signed   By: Marijo Conception, M.D.   On: 12/22/2015 14:59   Dg Chest 2 View  Result Date: 12/16/2015 CLINICAL DATA:  Left lower chest pain. Shortness of breath. Productive cough. Bilateral leg pain. Morbid obesity. EXAM: CHEST  2 VIEW COMPARISON:  12/13/2015. FINDINGS: Stable enlarged cardiac silhouette and prominent pulmonary vasculature. A poor inspiration is again demonstrated. Grossly clear lungs. Thoracic spine degenerative changes. IMPRESSION: Stable poor inspiration, cardiomegaly and pulmonary vascular congestion. Electronically Signed   By: Claudie Revering M.D.   On: 12/16/2015 16:02   Dg Chest 2 View  Result Date: 12/13/2015 CLINICAL DATA:  Acute onset of shortness of breath and bilateral leg swelling. Wheezing. Initial encounter. EXAM: CHEST  2 VIEW COMPARISON:  None. FINDINGS: The lungs are well-aerated. Vascular congestion is noted. A small left pleural effusion is seen. Increased interstitial markings raise concern for pulmonary edema. There is no evidence of pneumothorax. The heart is enlarged.  No acute osseous abnormalities are seen. IMPRESSION: Vascular congestion and cardiomegaly. Small left pleural effusion seen. Increased interstitial markings raise concern for pulmonary edema. Electronically Signed   By: Garald Balding M.D.   On: 12/13/2015 22:12   Ct Head Wo Contrast  Result Date: 12/16/2015 CLINICAL DATA:  Left-sided headache. Dizziness. Some blurred vision. EXAM: CT HEAD WITHOUT CONTRAST TECHNIQUE: Contiguous axial images were obtained from the base of the skull through the vertex without intravenous contrast. COMPARISON:  None. FINDINGS: Brain: No evidence of acute infarction, hemorrhage, hydrocephalus, extra-axial collection or mass lesion/mass effect. Vascular: No hyperdense vessel or unexpected calcification. Skull: Normal. Negative for fracture or focal lesion. Sinuses/Orbits: No acute finding. Other: None. IMPRESSION: Normal examination. Electronically Signed    By: Claudie Revering M.D.   On: 12/16/2015 16:00   Nm Pulmonary Perf And Vent  Result Date: 12/14/2015 CLINICAL DATA:  Shortness of breath and decreased oxygen saturation EXAM: NUCLEAR MEDICINE VENTILATION - PERFUSION LUNG SCAN Views: Anterior, posterior, LPO, RPO, LAO, RAO -ventilation and perfusion. Patient could not tolerate lateral imaging. RADIOPHARMACEUTICALS:  29.7 mCi Technetium-50m DTPA aerosol inhalation and 4.17 mCi Technetium-22m MAA IV COMPARISON:  Chest radiograph December 13, 2015 FINDINGS: Ventilation: Radiotracer uptake bilaterally is homogeneous and symmetric bilaterally. No focal ventilation defects are evident. Perfusion: Radiotracer uptake bilaterally is homogeneous and symmetric bilaterally. No focal perfusion defects are evident. There is no appreciable ventilation/perfusion mismatch. IMPRESSION: No ventilation or perfusion defects evident. Very low probability of pulmonary embolus. Electronically Signed   By: Lowella Grip III M.D.   On: 12/14/2015 16:13   Dg Chest Port 1 View  Result Date: 12/19/2015 CLINICAL DATA:  Recent pulmonary vascular congestion EXAM: PORTABLE CHEST 1 VIEW COMPARISON:  December 16, 2015 FINDINGS: There is no edema or consolidation. There remains cardiomegaly with pulmonary venous hypertension. No adenopathy. No bone lesions. IMPRESSION: Persistent evidence of pulmonary vascular congestion without frank edema or consolidation. Electronically Signed   By: Lowella Grip III M.D.   On: 12/19/2015 09:37       Subjective: No new complaints.   Discharge Exam: Vitals:   12/23/15 2251 12/24/15 0603  BP:  102/67  Pulse: 78 76  Resp: 16 16  Temp:  97.7 F (36.5 C)   Vitals:   12/23/15 2142 12/23/15 2251 12/24/15 0603 12/24/15 0731  BP: 135/85  102/67   Pulse: 71 78 76   Resp: 16 16 16    Temp: 98.4 F (36.9 C)  97.7 F (36.5 C)   TempSrc: Oral  Oral   SpO2: 94% 95% 93% 93%  Weight:      Height:        General: Pt is alert, awake, not  in acute distress Cardiovascular: RRR, S1/S2 +, no rubs, no gallops Respiratory: CTA bilaterally, no wheezing, no rhonchi Abdominal: Soft, NT, ND, bowel sounds + Extremities: no edema, no cyanosis    The results of significant diagnostics from this hospitalization (including imaging, microbiology, ancillary and laboratory) are listed below for reference.     Microbiology: No results found for this or any previous visit (from the past 240 hour(s)).   Labs: BNP (last 3 results)  Recent Labs  06/09/15 0320 09/28/15 1612 12/13/15 2120  BNP 149.6* 65.2 48.1   Basic Metabolic Panel:  Recent Labs Lab 12/18/15 0506 12/19/15  0768 12/20/15 0509 12/21/15 0936 12/23/15 0408  NA 139 141 140 139 140  K 4.6 5.5* 5.0 4.3 4.7  CL 97* 100* 99* 95* 100*  CO2 35* 35* 34* 38* 33*  GLUCOSE 113* 101* 90 154* 112*  BUN 62* 52* 43* 42* 44*  CREATININE 1.93* 1.40* 1.23* 1.54* 1.46*  CALCIUM 7.8* 7.9* 7.7* 8.1* 8.1*   Liver Function Tests: No results for input(s): AST, ALT, ALKPHOS, BILITOT, PROT, ALBUMIN in the last 168 hours. No results for input(s): LIPASE, AMYLASE in the last 168 hours. No results for input(s): AMMONIA in the last 168 hours. CBC:  Recent Labs Lab 12/23/15 0408  WBC 14.1*  HGB 11.0*  HCT 36.4  MCV 95.8  PLT 175   Cardiac Enzymes: No results for input(s): CKTOTAL, CKMB, CKMBINDEX, TROPONINI in the last 168 hours. BNP: Invalid input(s): POCBNP CBG: No results for input(s): GLUCAP in the last 168 hours. D-Dimer No results for input(s): DDIMER in the last 72 hours. Hgb A1c No results for input(s): HGBA1C in the last 72 hours. Lipid Profile No results for input(s): CHOL, HDL, LDLCALC, TRIG, CHOLHDL, LDLDIRECT in the last 72 hours. Thyroid function studies No results for input(s): TSH, T4TOTAL, T3FREE, THYROIDAB in the last 72 hours.  Invalid input(s): FREET3 Anemia work up No results for input(s): VITAMINB12, FOLATE, FERRITIN, TIBC, IRON, RETICCTPCT in  the last 72 hours. Urinalysis    Component Value Date/Time   COLORURINE YELLOW 12/01/2014 2240   APPEARANCEUR CLEAR 12/01/2014 2240   LABSPEC >1.030 (H) 12/01/2014 2240   PHURINE 5.0 12/01/2014 2240   GLUCOSEU NEGATIVE 12/01/2014 2240   GLUCOSEU NEG mg/dL 10/01/2007 2007   HGBUR NEGATIVE 12/01/2014 2240   HGBUR negative 12/05/2009 1003   BILIRUBINUR NEGATIVE 12/01/2014 2240   BILIRUBINUR small 07/31/2010 1713   KETONESUR NEGATIVE 12/01/2014 2240   PROTEINUR NEGATIVE 12/01/2014 2240   UROBILINOGEN 0.2 12/01/2014 2240   NITRITE NEGATIVE 12/01/2014 2240   LEUKOCYTESUR NEGATIVE 12/01/2014 2240   Sepsis Labs Invalid input(s): PROCALCITONIN,  WBC,  LACTICIDVEN Microbiology No results found for this or any previous visit (from the past 240 hour(s)).   Time coordinating discharge: Over 30 minutes  SIGNED:   Hosie Poisson, MD  [facility] 12/24/2015, 1:13 PM Pager   If 7PM-7AM, please contact night-coverage [email or web address] Password [email or web address]

## 2015-12-24 NOTE — Progress Notes (Signed)
Pt with dme orders for home neb, hospital bed, 3in1, cane, home 02 and cpap. Pt currently active with [facility] for home 02. [facility] alerted of all orders. Transport tank, neb machine and cane delivered to pt room. Hospital bed, 3in1 and cpap to be delivered to hotel where pt is staying. [facility] dme rep alerted of pt having difficulty with concentrator at home and will follow up with pt. Because pt previously lost cpap, pt is aware that she will need to pay monthly rental out of pocket until new sleep study is done. This CM contacted [facility] and had order form filled out by hospitalist and faxed back. Sleep center will contact pt to set up appointment for sleep study. Pt informed of this. No other CM needs communicated. Marney Doctor RN,BSN,NCM [phone]

## 2015-12-30 IMAGING — CR DG CHEST 2V
2 series · 2 of 2 positions shown · non-contrast
Comparison: DG CHEST 1V PORT dated 06/02/2013;

CLINICAL DATA: Cough. Shortness of breath. Smoker with current
history of asthma and hypertension. Morbidly obese.

EXAM:
CHEST  2 VIEW

[w chest lat]
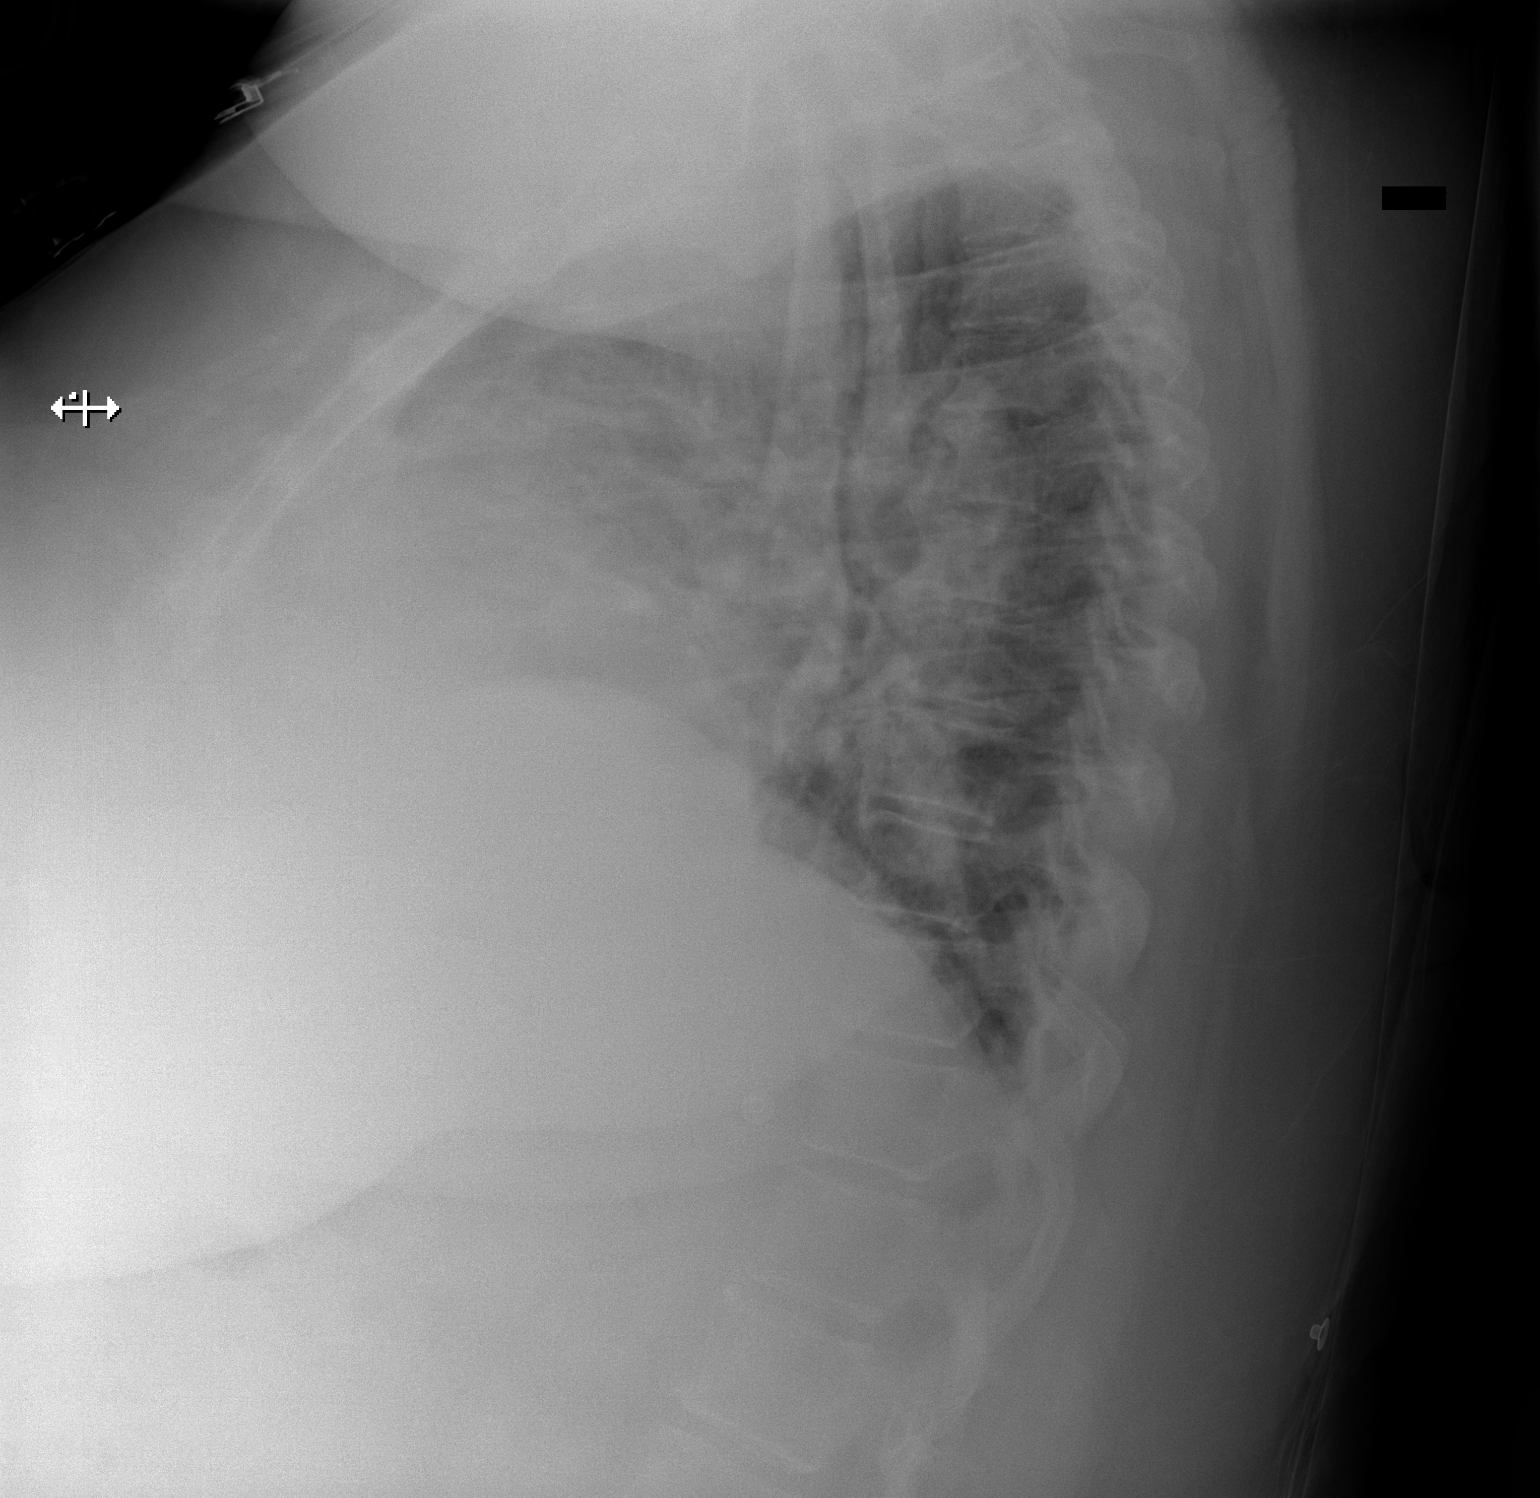

[x chest ap]
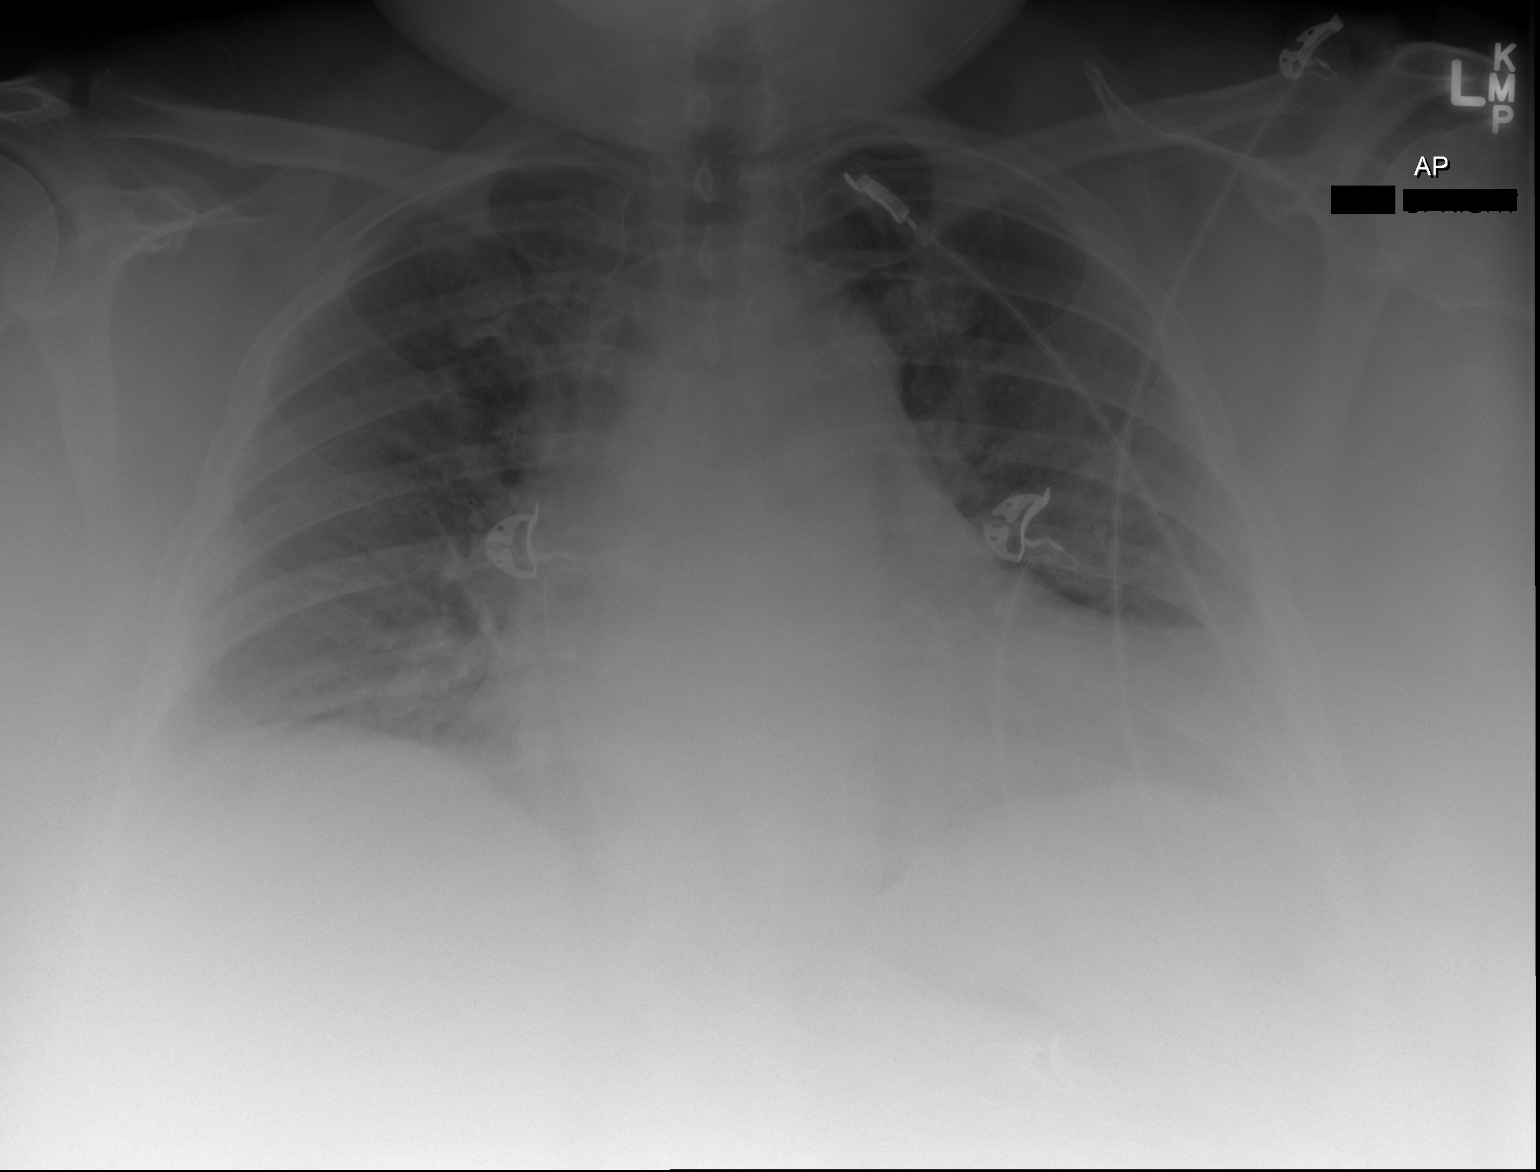

[2 of 2 positions shown; findings below may reference images not displayed]

DG CHEST 2 VIEW dated
04/14/2013; DG CHEST 2 VIEW dated 01/09/2013; DG CHEST 2 VIEW dated
01/06/2013; DG CHEST 2 VIEW dated 03/22/2012
FINDINGS: Suboptimal inspiration due to body habitus accounts for crowded
bronchovascular markings diffusely and bibasilar atelectasis, and
also accentuates the cardiac silhouette. Taking this into account,
cardiac silhouette moderately enlarged but stable. Hilar and
mediastinal contours otherwise unremarkable. Prominent
bronchovascular markings diffusely and mild central peribronchial
thickening, unchanged. Lungs otherwise clear. No localized airspace
consolidation. No pleural effusions. No pneumothorax. Normal
pulmonary vascularity. . Degenerative changes involving the thoracic
spine.
IMPRESSION: Stable cardiomegaly without pulmonary edema. Suboptimal inspiration
accounts for bibasilar atelectasis. No acute cardiopulmonary disease
otherwise. Stable mild chronic bronchitis and/or asthma.

## 2016-03-18 IMAGING — CR DG CHEST 2V
2 series · 2 of 2 positions shown · non-contrast
Comparison: 06/12/2013.

CLINICAL DATA: Shortness of breath with upper abdominal pain.

EXAM:
CHEST  2 VIEW

[w chest lat]
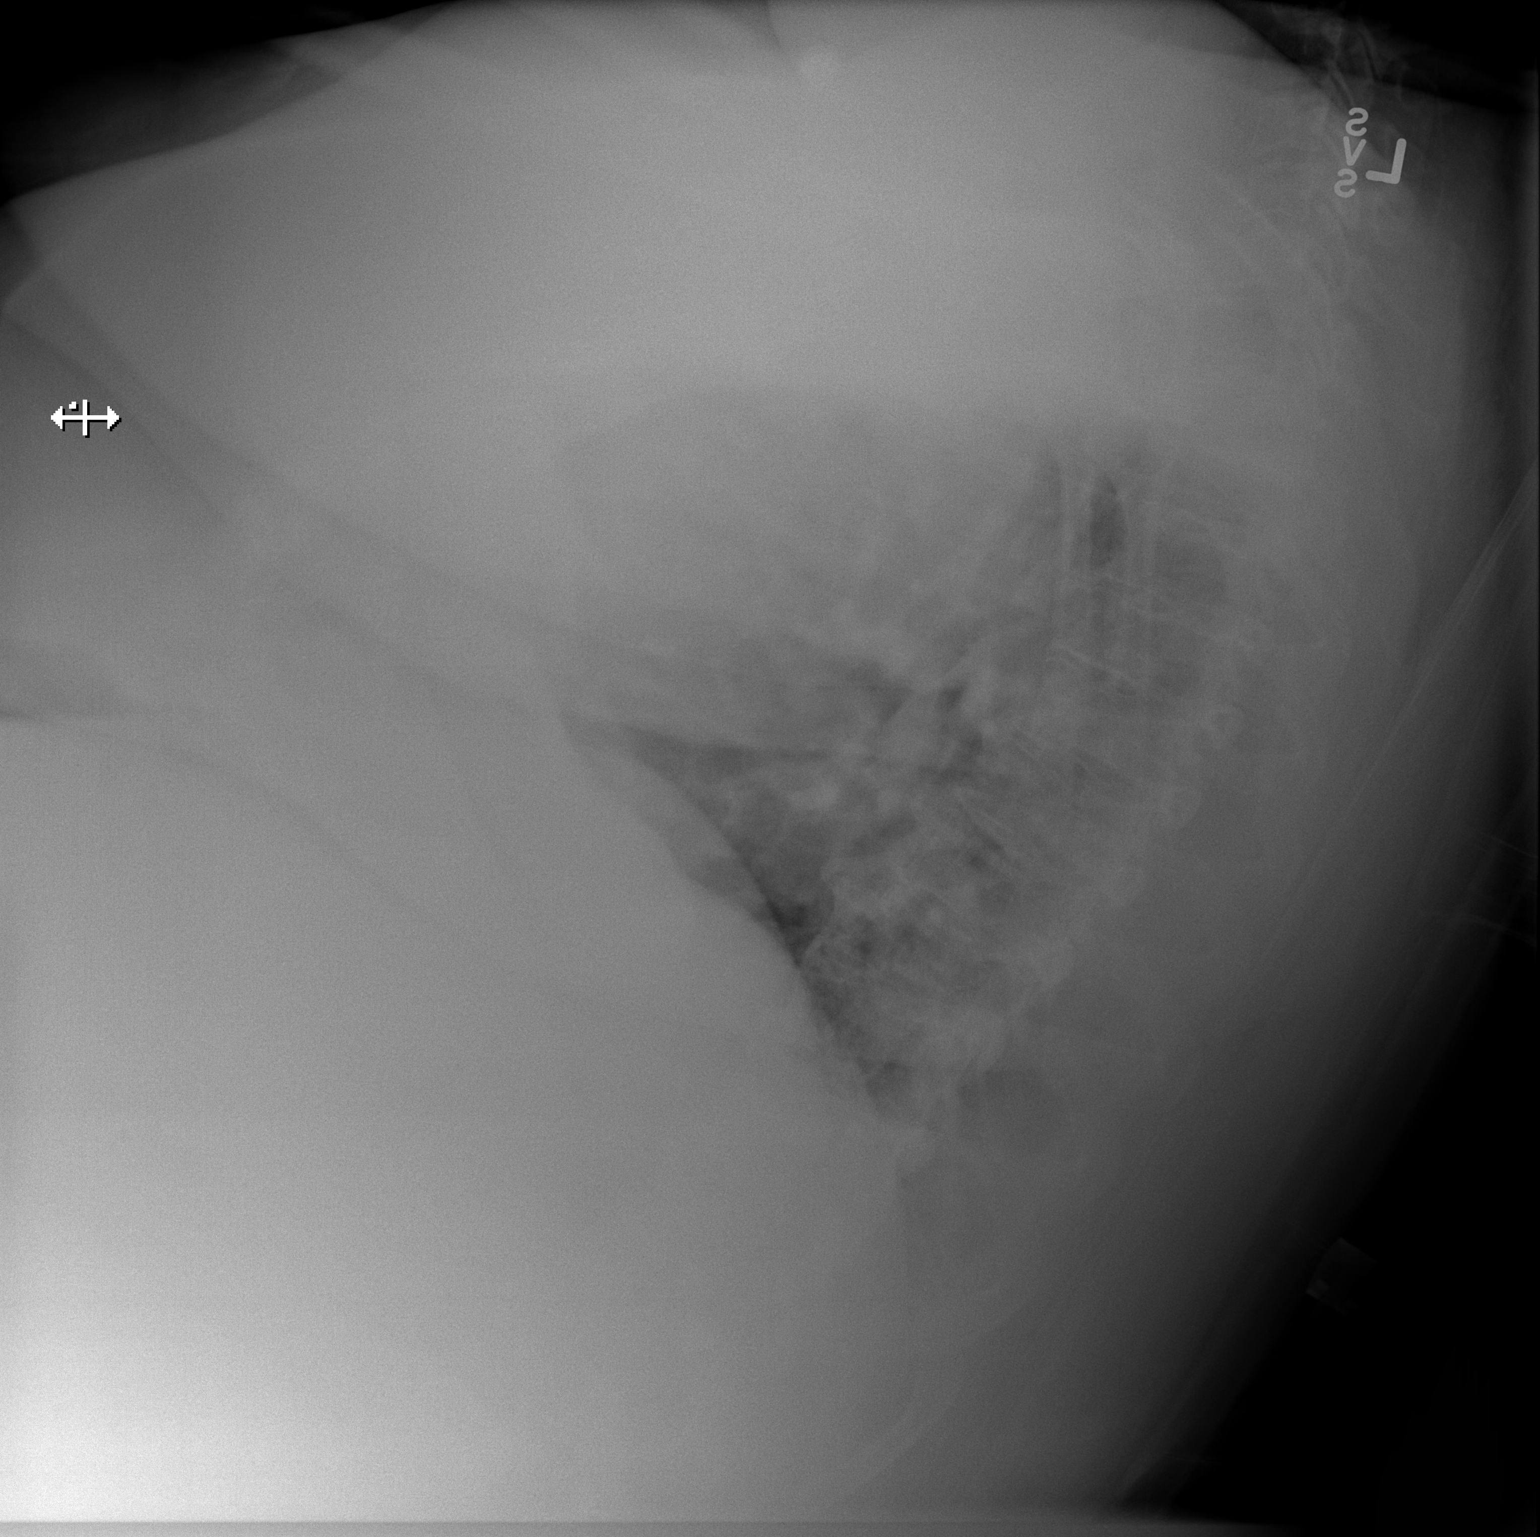

[x chest ap]
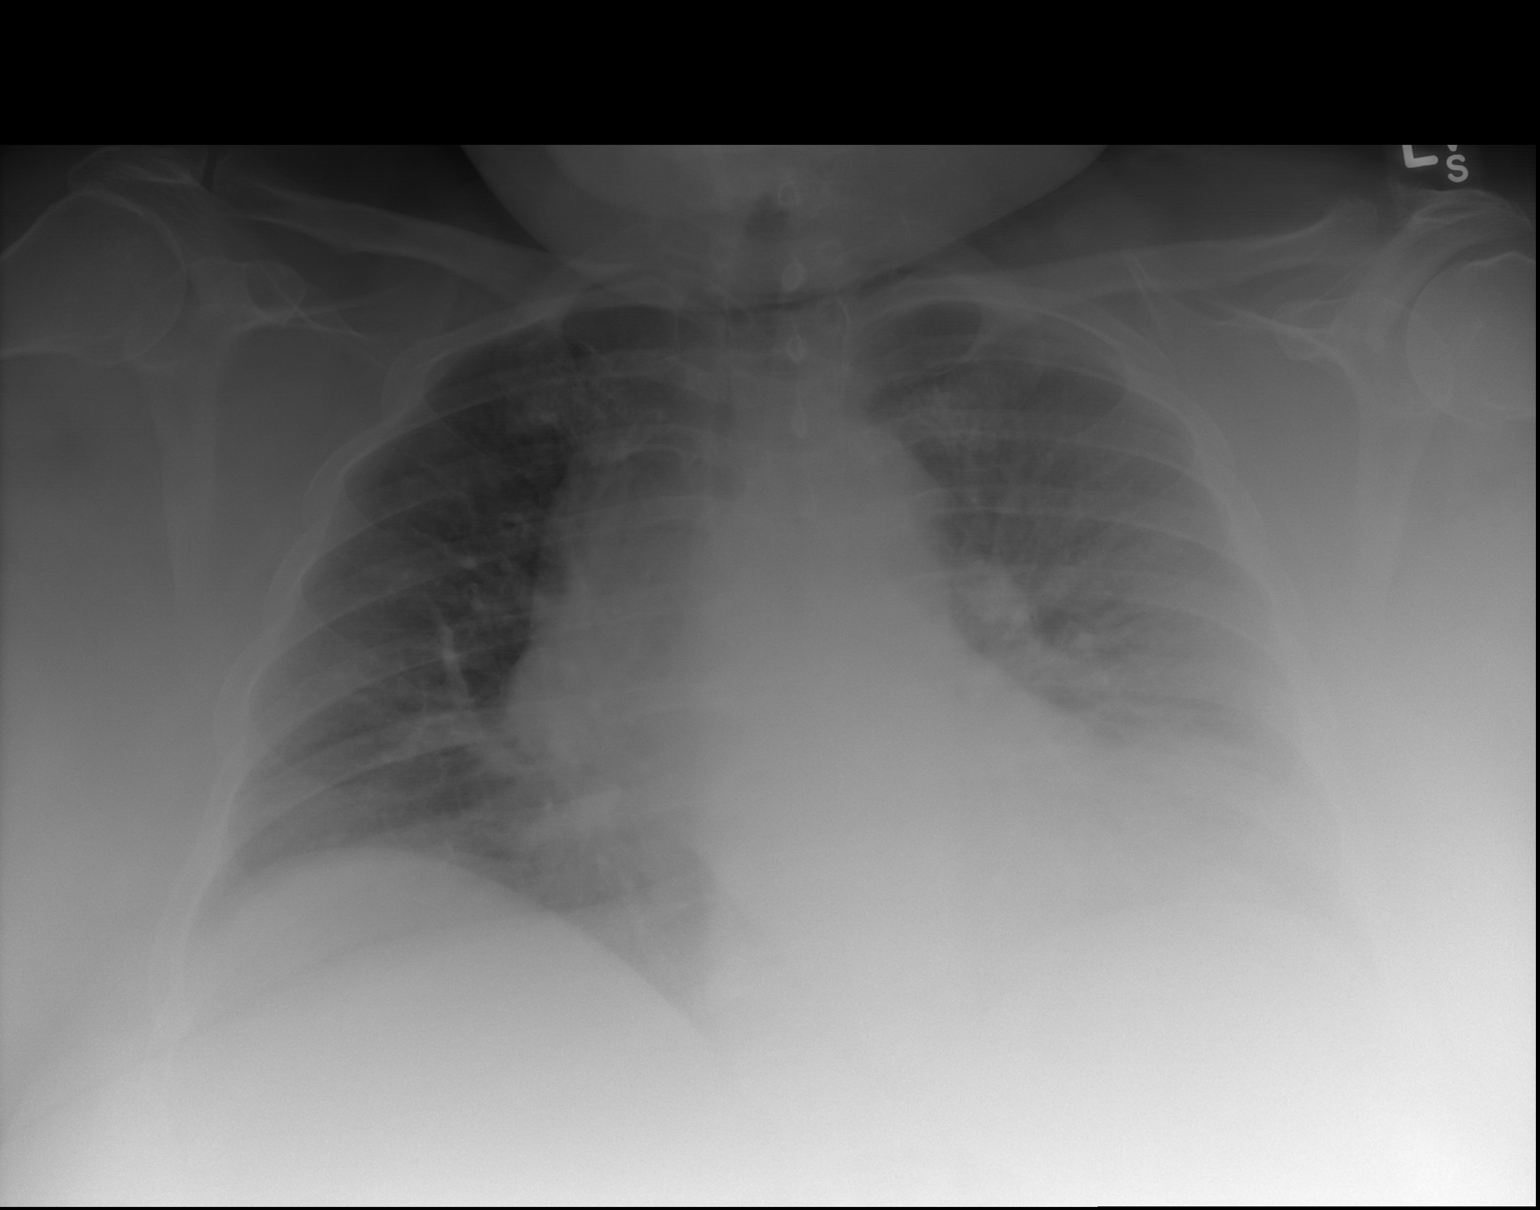

[2 of 2 positions shown; findings below may reference images not displayed]

FINDINGS: Cardiomegaly. Moderate vascular congestion. Early pulmonary edema
not excluded. No effusion or pneumothorax. Negative osseous
structures. Worsening aeration.
IMPRESSION: Cardiomegaly. Moderate vascular congestion with possible early
pulmonary edema.

## 2016-03-20 IMAGING — CR DG CHEST 1V PORT
1 series · 1 of 1 positions shown · non-contrast
Comparison: 08/30/2013 .

CLINICAL DATA: CHF.  Asthma.

EXAM:
PORTABLE CHEST - 1 VIEW

[AP]
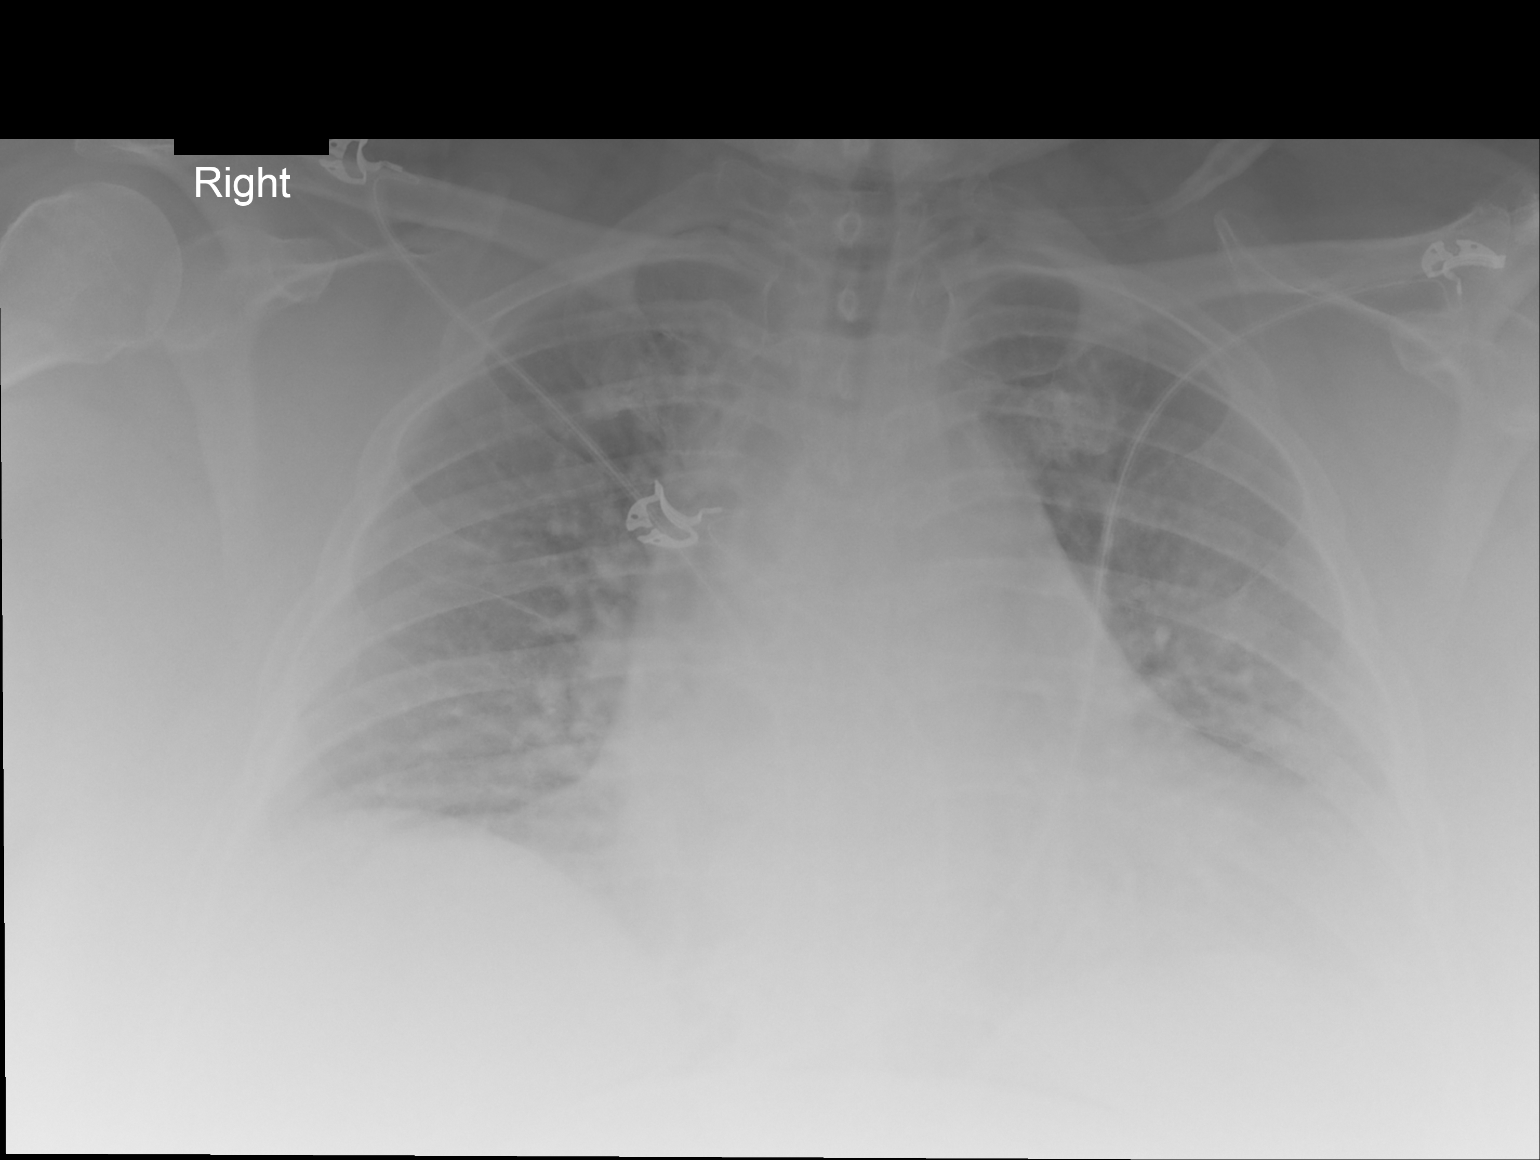

[1 of 1 positions shown; findings below may reference images not displayed]

FINDINGS: Cardiomegaly with pulmonary vascular prominence and interstitial
prominence noted consistent with congestive heart failure mild
interstitial edema. No focal alveolar infiltrate noted. No pleural
effusion or pneumothorax.
IMPRESSION: Mild congestive heart failure .

## 2016-04-07 IMAGING — CR DG CHEST 2V
2 series · 2 of 2 positions shown · non-contrast
Comparison: Portable chest x-ray 09/01/2013. Two-view chest x-ray
08/30/2013 dating back to 06/02/2013.

CLINICAL DATA: Shortness of breath. Prior history of CHF. Current
history of asthma and hypertension.

EXAM:
CHEST  2 VIEW

[w chest lat]
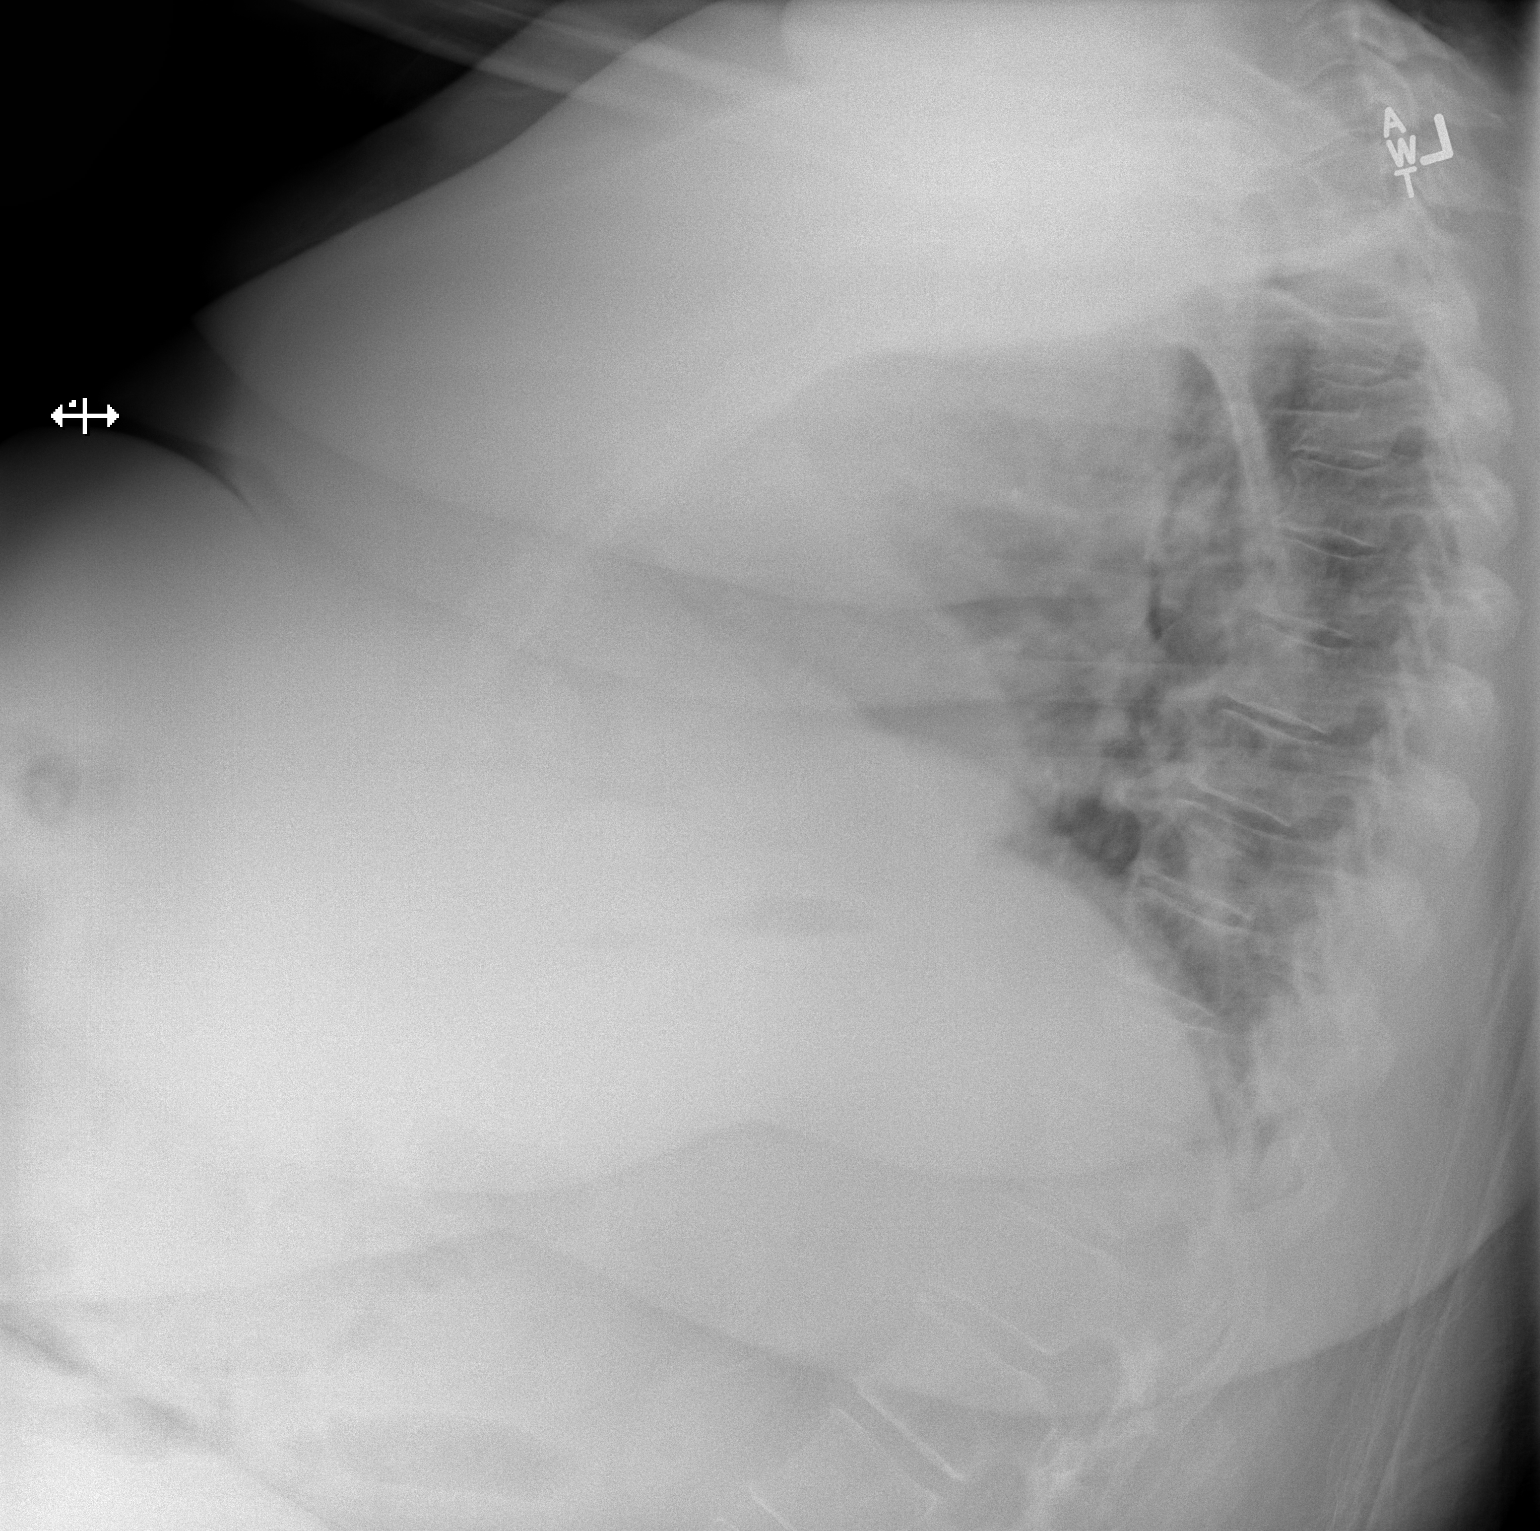

[x chest ap]
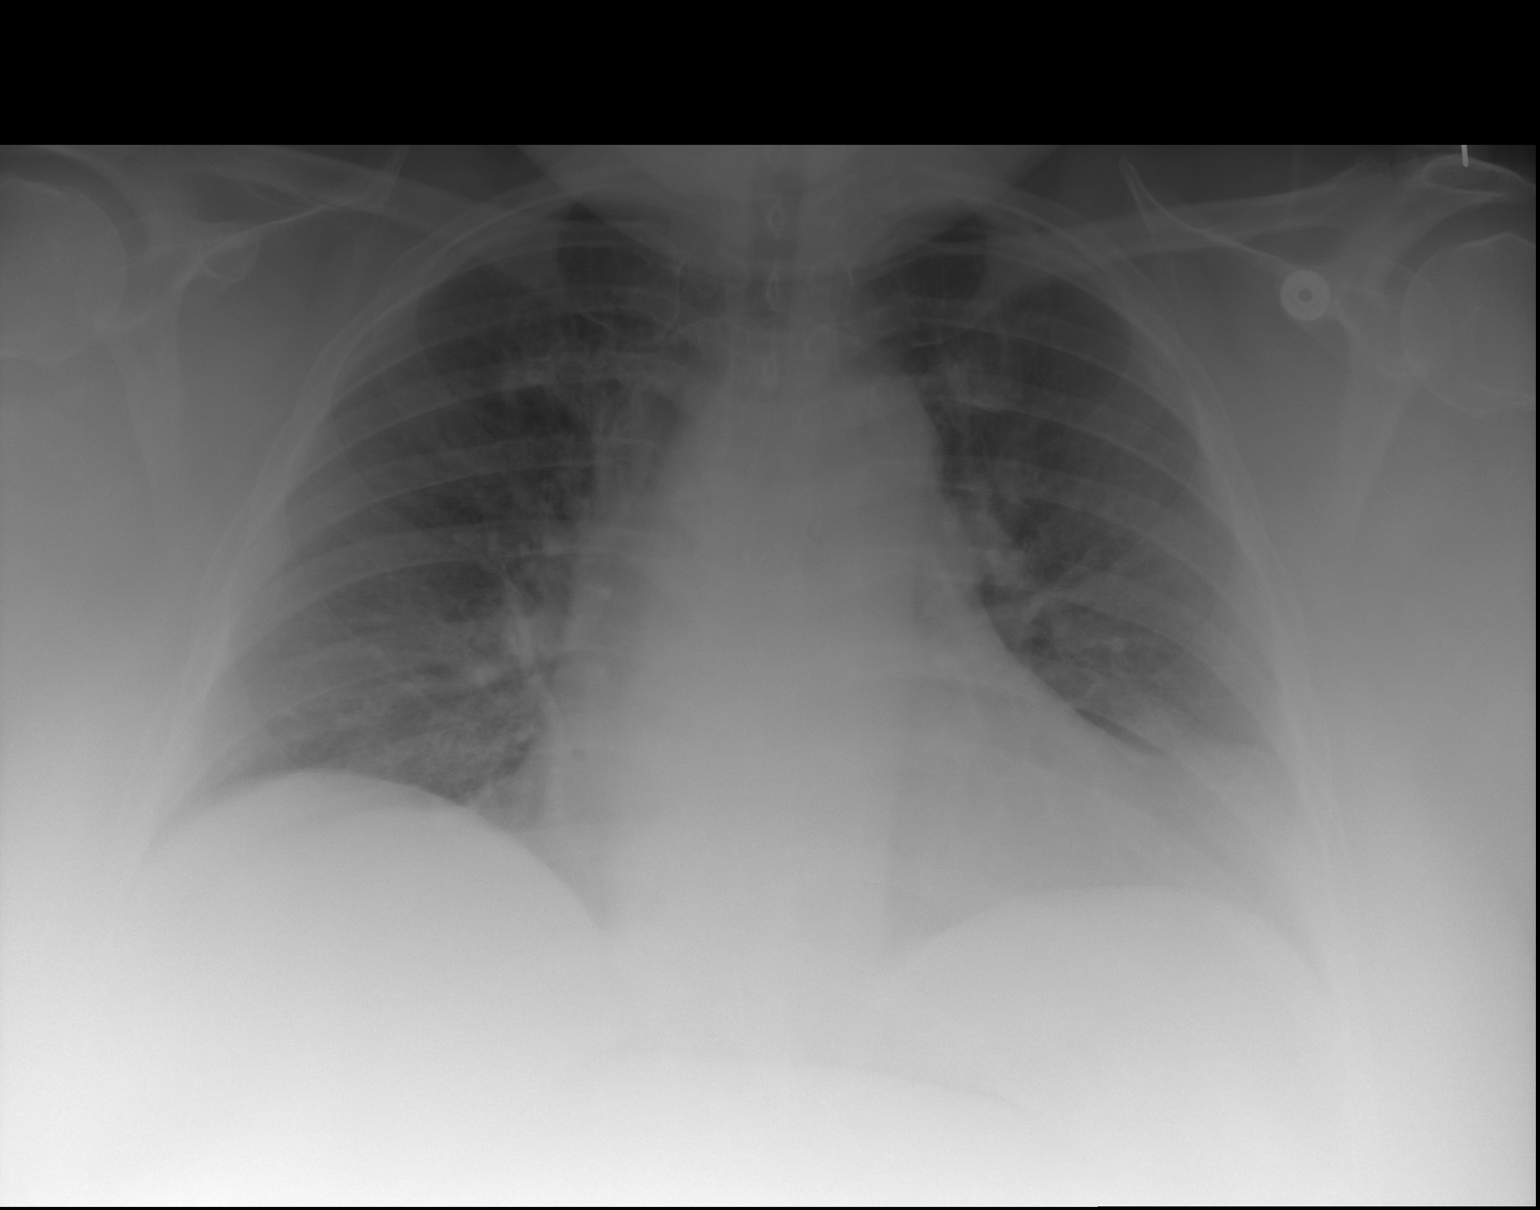

[2 of 2 positions shown; findings below may reference images not displayed]

FINDINGS: Suboptimal inspiration due to body habitus accounts for crowded
bronchovascular markings, especially in the bases, and accentuates
the cardiac silhouette. Taking this into account, Cardiac silhouette
mildly to moderately enlarged but stable. Hilar and mediastinal
contours otherwise unremarkable. Lungs clear. Bronchovascular
markings normal. Pulmonary vascularity normal. No visible pleural
effusions. No pneumothorax. Lateral image blurred by respiratory
motion. Degenerative changes throughout the thoracic spine.
IMPRESSION: Markedly suboptimal inspiration due to body habitus. Stable
cardiomegaly. No acute cardiopulmonary disease.

## 2016-07-18 ENCOUNTER — Emergency Department (HOSPITAL_COMMUNITY)
Admission: EM | Admit: 2016-07-18 | Discharge: 2016-07-19 | Disposition: A | Discharge status: Home / Self Care | Payer: Medicaid Other | Attending: Emergency Medicine | Admitting: Emergency Medicine

## 2016-07-18 ENCOUNTER — Emergency Department (HOSPITAL_COMMUNITY): Payer: Medicaid Other

## 2016-07-18 ENCOUNTER — Encounter (HOSPITAL_COMMUNITY): Payer: Self-pay

## 2016-07-18 DIAGNOSIS — E1122 Type 2 diabetes mellitus with diabetic chronic kidney disease: Secondary | ICD-10-CM | POA: Diagnosis not present

## 2016-07-18 DIAGNOSIS — I5033 Acute on chronic diastolic (congestive) heart failure: Secondary | ICD-10-CM | POA: Insufficient documentation

## 2016-07-18 DIAGNOSIS — J181 Lobar pneumonia, unspecified organism: Secondary | ICD-10-CM | POA: Diagnosis not present

## 2016-07-18 DIAGNOSIS — I130 Hypertensive heart and chronic kidney disease with heart failure and stage 1 through stage 4 chronic kidney disease, or unspecified chronic kidney disease: Secondary | ICD-10-CM | POA: Diagnosis not present

## 2016-07-18 DIAGNOSIS — N183 Chronic kidney disease, stage 3 (moderate): Secondary | ICD-10-CM | POA: Insufficient documentation

## 2016-07-18 DIAGNOSIS — J189 Pneumonia, unspecified organism: Secondary | ICD-10-CM

## 2016-07-18 DIAGNOSIS — R0602 Shortness of breath: Secondary | ICD-10-CM | POA: Diagnosis present

## 2016-07-18 DIAGNOSIS — Z87891 Personal history of nicotine dependence: Secondary | ICD-10-CM | POA: Diagnosis not present

## 2016-07-18 LAB — BASIC METABOLIC PANEL
ANION GAP: 15 (ref 5–15)
BUN: 16 mg/dL (ref 6–20)
CO2: 30 mmol/L (ref 22–32)
CREATININE: 1.55 mg/dL — AB (ref 0.44–1.00)
Calcium: 8.9 mg/dL (ref 8.9–10.3)
Chloride: 96 mmol/L — ABNORMAL LOW (ref 101–111)
GFR calc non Af Amer: 37 mL/min — ABNORMAL LOW (ref >60)
GFR, EST AFRICAN AMERICAN: 43 mL/min — AB (ref >60)
GLUCOSE: 134 mg/dL — AB (ref 65–99)
Potassium: 3.6 mmol/L (ref 3.5–5.1)
Sodium: 141 mmol/L (ref 135–145)

## 2016-07-18 LAB — CBC
HCT: 45.5 % (ref 36.0–46.0)
HEMOGLOBIN: 13.8 g/dL (ref 12.0–15.0)
MCH: 28.5 pg (ref 26.0–34.0)
MCHC: 30.3 g/dL (ref 30.0–36.0)
MCV: 94 fL (ref 78.0–100.0)
Platelets: 221 10*3/uL (ref 150–400)
RBC: 4.84 MIL/uL (ref 3.87–5.11)
RDW: 15.1 % (ref 11.5–15.5)
WBC: 9.2 10*3/uL (ref 4.0–10.5)

## 2016-07-18 LAB — I-STAT TROPONIN, ED: Troponin i, poc: 0.01 ng/mL (ref 0.00–0.08)

## 2016-07-18 MED ORDER — LEVOFLOXACIN 750 MG PO TABS
750.0000 mg | ORAL_TABLET | Freq: Once | ORAL | Status: AC
  Administered 2016-07-19: 750 mg via ORAL
  Filled 2016-07-18: qty 1

## 2016-07-18 MED ORDER — FUROSEMIDE 20 MG PO TABS
80.0000 mg | ORAL_TABLET | Freq: Once | ORAL | Status: AC
  Administered 2016-07-19: 80 mg via ORAL
  Filled 2016-07-18: qty 4

## 2016-07-18 NOTE — ED Provider Notes (Signed)
[facility] DEPT Provider Note   CSN: [id number] Arrival date & time: 07/18/16  2008 By signing my name below, I, Dyke Brackett, attest that this documentation has been prepared under the direction and in the presence of Everlene Balls, MD . Electronically Signed: Dyke Brackett, Scribe. 07/18/2016. 11:43 PM.   History   Chief Complaint Chief Complaint  Patient presents with  . Shortness of Breath  . Chest Pain    HPI Kara Vincent is a 51 y.o. female with a history of CHF, asthma, CKD, and OSA who presents to the Emergency Department complaining of constant, moderate shortness of breath onset 2-3 days ago. Pt states her shortness of breath is exacerbated by exertion, direct palpation and certain positions. No alleviating factors noted. She reports associated central chest pain, cough productive of yellow sputum, bilateral leg swelling, myalgias to BLE, and  left eye discharge. Per pt, she has been hospitalized for pneumonia in the past. Pt is currently on Lasix. No OTC treatments tried for these symptoms PTA. Pt is not currently followed by a cardiologist. No recent sick contact. She denies any fever.  The history is provided by the patient. No language interpreter was used.    Past Medical History:  Diagnosis Date  . Allergy   . Anxiety   . Arthritis   . Asthma   . Asthma   . CHF (congestive heart failure) ([place])   . CHF (congestive heart failure) ([place])   . Chronic abdominal pain   . Chronic kidney disease   . Chronic pain    "all over"  . 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 GERD (gastroesophageal reflux disease)   . Gout   . Hidradenitis   . HLD (hyperlipidemia) 12/14/2015  . Homelessness   . Hyperlipemia   . Hypertension   . Low back pain   . Morbid obesity ([place])    uses motor wheel chair  . MRSA (methicillin resistant Staphylococcus aureus)    states about a year ago  . On home O2    3L N/C O2 continuously  . OSA (obstructive sleep apnea)    non-compliant with CPAP  . Oxygen deficiency   . Renal insufficiency   . Sleep apnea     Patient Active Problem List   Diagnosis Date Noted  . Acute pulmonary edema ([place]) 12/14/2015  . Essential hypertension 12/14/2015  . HLD (hyperlipidemia) 12/14/2015  . GERD (gastroesophageal reflux disease) 12/14/2015  . Gout 12/14/2015  . Acute on chronic diastolic (congestive) heart failure ([place]) 12/14/2015  . Chest pain 12/14/2015  . Shortness of breath 06/09/2015  . Atypical chest pain   . Darier's disease   . Leukocytosis   . 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 Acute bronchitis 11/20/2014  . 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 OSA (obstructive sleep apnea)   . CKD (chronic kidney disease) stage 3, GFR 30-59 ml/min 11/17/2014  . Asthma exacerbation 11/16/2014  . Venous stasis 11/16/2014  . Acute on chronic respiratory failure with hypoxia ([place]) 11/16/2014  . Hypotension 08/08/2012  . Chronic pain 08/24/2011  . Morbid obesity ([place]) 08/24/2011    Past Surgical History:  Procedure Laterality Date  . BACK SURGERY    . CESAREAN SECTION    . CESAREAN SECTION    . FRACTURE SURGERY    . TUBAL LIGATION      OB History    Gravida Para Term Preterm AB Living   0 0 0 0 0  SAB TAB Ectopic Multiple Live Births   0 0 0           Home Medications    Prior to Admission medications   Medication Sig Start Date End Date Taking? Authorizing Provider  albuterol (PROVENTIL) (2.5 MG/3ML) 0.083% nebulizer solution Take 2.5 mg by nebulization every 6 (six) hours as needed for wheezing or shortness of breath.    Historical Provider, MD  allopurinol (ZYLOPRIM) 100 MG tablet Take 1 tablet (100 mg total) by mouth every morning. 01/22/14   Donne Hazel, MD  ALPRAZolam Duanne Moron) 1 MG tablet Take 1 mg by mouth 3 (three) times daily as needed for anxiety (anxiety).     Historical Provider, MD  ammonium lactate (AMLACTIN) 12 % cream Apply 1 g topically daily as needed for dry skin. 04/22/14   Linton Flemings, MD  aspirin 325 MG tablet Take 325 mg by mouth every morning.     Historical Provider, MD  atorvastatin (LIPITOR) 20 MG tablet Take 1 tablet (20 mg total) by mouth every morning. 06/11/15   Lavina Hamman, MD  benzonatate (TESSALON) 100 MG capsule Take 1 capsule (100 mg total) by mouth 3 (three) times daily as needed for cough. 06/11/15   Lavina Hamman, MD  budesonide-formoterol (SYMBICORT) 160-4.5 MCG/ACT inhaler Inhale 2 puffs into the lungs 2 (two) times daily. 11/21/14   Debbe Odea, MD  butalbital-acetaminophen-caffeine (FIORICET, ESGIC) [phone] MG tablet Take 1 tablet by mouth every 6 (six) hours as needed for headache. 06/11/15   Lavina Hamman, MD  dextromethorphan-guaiFENesin [facility] DM) 30-600 MG 12hr tablet Take 1 tablet by mouth 2 (two) times daily. 06/11/15   Lavina Hamman, MD  Emollient (EUCERIN) lotion Apply 5 mLs topically 2 (two) times daily as needed for dry skin. Patient taking differently: Apply 5 mLs topically 3 (three) times daily.  04/22/14   Linton Flemings, MD  escitalopram (LEXAPRO) 10 MG tablet Take 10 mg by mouth daily.    Historical Provider, MD  folic acid (FOLVITE) 1 MG tablet Take 1 tablet (1 mg total) by mouth daily. 01/22/14   Donne Hazel, MD  furosemide (LASIX) 20 MG tablet Take 1 tablet (20 mg total) by mouth daily. 12/25/15   Hosie Poisson, MD  gabapentin (NEURONTIN) 100 MG capsule Take 1 capsule (100 mg total) by mouth 2 (two) times daily. 06/11/15   Lavina Hamman, MD  HYDROcodone-acetaminophen (NORCO) 10-325 MG tablet Take 1 tablet by mouth every 4 (four) hours as needed for moderate pain. 12/24/15   Hosie Poisson, MD  hydrOXYzine (ATARAX/VISTARIL) 25 MG tablet Take 25 mg by mouth 3 (three) times daily as needed for anxiety or itching (anxiety and itching).     Historical Provider, MD  indomethacin (INDOCIN SR) 75 MG CR capsule Take 75 mg by mouth 2 (two) times daily with a meal.    Historical Provider, MD  ipratropium-albuterol (DUONEB) 0.5-2.5 (3) MG/3ML SOLN  Take 3 mLs by nebulization every 6 (six) hours as needed. 12/24/15   Hosie Poisson, MD  isosorbide mononitrate (IMDUR) 30 MG 24 hr tablet Take 30 mg by mouth daily. 07/27/15   Historical Provider, MD  levofloxacin (LEVAQUIN) 750 MG tablet Take 1 tablet (750 mg total) by mouth daily before supper. 12/24/15   Hosie Poisson, MD  lisinopril (PRINIVIL,ZESTRIL) 40 MG tablet Hold medication for one week, follow up with PCP , check renal function, if its back to baseline, can start taking it. 12/24/15   Hosie Poisson, MD  methocarbamol (  ROBAXIN) 500 MG tablet Take 1 tablet (500 mg total) by mouth every 8 (eight) hours as needed for muscle spasms. 06/11/15   Lavina Hamman, MD  Omega-3 Fatty Acids (FISH OIL PO) Take 1 capsule by mouth 2 (two) times daily.    Historical Provider, MD  pantoprazole (PROTONIX) 40 MG tablet Take 1 tablet (40 mg total) by mouth every morning. 06/11/15   Lavina Hamman, MD  solifenacin (VESICARE) 5 MG tablet Take 5 mg by mouth every morning.     Historical Provider, MD  topiramate (TOPAMAX) 100 MG tablet Take 100 mg by mouth 2 (two) times daily.    Historical Provider, MD  VITAMIN E PO Take 1 capsule by mouth daily.    Historical Provider, MD    Family History Family History  Problem Relation Age of Onset  . Asthma Mother   . Diabetes Father   . Stroke Father   . Heart disease Father   . Arthritis Sister   . Asthma Sister   . Asthma Daughter   . Asthma Son   . Arthritis Sister   . Asthma Sister     Social History Social History  Substance Use Topics  . Smoking status: Former Smoker    Types: Cigarettes    Quit date: 03/25/2003  . Smokeless tobacco: Former Systems developer    Quit date: 05/27/1978  . Alcohol use 0.0 oz/week     Comment: years ago  no longer beer only  early 20's     Allergies   Ibuprofen; Penicillins; Sulfa antibiotics; Doxycycline; Ibuprofen; Zithromax [azithromycin]; Adhesive [tape]; Adhesive [tape]; Azithromycin; Doxycycline; Penicillins; Sulfa antibiotics; and  Sulfamethoxazole-trimethoprim   Review of Systems Review of Systems All systems reviewed and are negative for acute change except as noted in the HPI.  Physical Exam Updated Vital Signs BP (!) 129/42 (BP Location: Left Arm)   Pulse 84   Temp 99.3 F (37.4 C) (Oral)   Resp (!) 21   LMP 03/24/2009   SpO2 95%   Physical Exam  Constitutional: She is oriented to person, place, and time. She appears well-developed and well-nourished. No distress.  Obese  HENT:  Head: Normocephalic and atraumatic.  Nose: Nose normal.  Mouth/Throat: Oropharynx is clear and moist. No oropharyngeal exudate.  Eyes: Conjunctivae and EOM are normal. Pupils are equal, round, and reactive to light. No scleral icterus.  Neck: Normal range of motion. Neck supple. No JVD present. No tracheal deviation present. No thyromegaly present.  Cardiovascular: Normal rate, regular rhythm and normal heart sounds.  Exam reveals no gallop and no friction rub.   No murmur heard. Distant heart and lung sounds  Pulmonary/Chest: Effort normal and breath sounds normal. No respiratory distress. She has no wheezes. She exhibits no tenderness.  Abdominal: Soft. Bowel sounds are normal. She exhibits no distension and no mass. There is no tenderness. There is no rebound and no guarding.  Musculoskeletal: Normal range of motion. She exhibits no edema or tenderness.  Lymphadenopathy:    She has no cervical adenopathy.  Neurological: She is alert and oriented to person, place, and time. No cranial nerve deficit. She exhibits normal muscle tone.  Skin: Skin is warm and dry. No rash noted. No erythema. No pallor.  Nursing note and vitals reviewed.  ED Treatments / Results  DIAGNOSTIC STUDIES:  Oxygen Saturation is 95% on RA, adequate by my interpretation.    COORDINATION OF CARE:  11:32 PM Discussed treatment plan which includes abx with pt at bedside and pt agreed  to plan.  Labs (all labs ordered are listed, but only abnormal  results are displayed) Labs Reviewed  BASIC METABOLIC PANEL - Abnormal; Notable for the following:       Result Value   Chloride 96 (*)    Glucose, Bld 134 (*)    Creatinine, Ser 1.55 (*)    GFR calc non Af Amer 37 (*)    GFR calc Af Amer 43 (*)    All other components within normal limits  CBC  I-STAT TROPOININ, ED    EKG  EKG Interpretation  Date/Time:  Friday July 18 2016 20:18:07 EDT Ventricular Rate:  81 PR Interval:  148 QRS Duration: 98 QT Interval:  400 QTC Calculation: 464 R Axis:   59 Text Interpretation:  Normal sinus rhythm with sinus arrhythmia Low voltage QRS Borderline ECG No significant change since last tracing Confirmed by Glynn Octave [id number]) on 07/18/2016 11:25:57 PM       Radiology Dg Chest 2 View  Result Date: 07/18/2016 CLINICAL DATA:  Shortness of breath. Chest pain. Low back and right arm pain after trauma. EXAM: CHEST  2 VIEW COMPARISON:  September 28, 2015 FINDINGS: No pneumothorax. Stable cardiomegaly. The hila and mediastinum are unchanged. Mild opacity in the lateral right lung base. The lungs are otherwise clear. No overt edema. Limited views of the thoracic spine are unremarkable. IMPRESSION: Mild opacity in the lateral right lung base could represent atelectasis or early infiltrate. Recommend clinical correlation and follow-up to resolution. Electronically Signed   By: Dorise Bullion III M.D   On: 07/18/2016 20:57    Procedures Procedures (including critical care time)  Medications Ordered in ED Medications - No data to display   Initial Impression / Assessment and Plan / ED Course  I have reviewed the triage vital signs and the nursing notes.  Pertinent labs & imaging results that were available during my care of the patient were reviewed by me and considered in my medical decision making (see chart for details).     Patient presents to the ED for worsening SOB.  CXR shows possible early pneumonia, and patient is having pain in  the same area.  She is concerned for worsening swelling in her legs, but I can not appreciate any pitting edema. She is compliant with her lasix.  Advised to see cardiology within 3 days for close follow up. She was given lasix 80mg  in the ED and will start on levoquin for pneumonia. She states she is on chronic O2 but her sat is currently 95% on RA.  I do not believe this patient needs to be admitted.  She appears well and in NAD. VS remain within her normal limits and she is safe for DC.  Final Clinical Impressions(s) / ED Diagnoses   Final diagnoses:  None    New Prescriptions New Prescriptions   No medications on file   I personally performed the services described in this documentation, which was scribed in my presence. The recorded information has been reviewed and is accurate.      Everlene Balls, MD 07/19/16 [phone]

## 2016-07-18 NOTE — ED Triage Notes (Signed)
Pt complaining of cough and SOB x 3 days. Pt states hx of CHF, has new swelling in legs. Pt also states fell on R arm several days ago, complaining of R arm pain.

## 2016-07-19 MED ORDER — HYDROCODONE-ACETAMINOPHEN 5-325 MG PO TABS
1.0000 | ORAL_TABLET | Freq: Once | ORAL | Status: AC
  Administered 2016-07-19: 1 via ORAL
  Filled 2016-07-19: qty 1

## 2016-07-19 MED ORDER — LEVOFLOXACIN 750 MG PO TABS: 750.0000 mg | ORAL_TABLET | Freq: Every day | ORAL | 0 refills | Status: DC

## 2016-07-19 NOTE — ED Notes (Signed)
Pt verbalized understanding discharge instructions and denies any further needs or questions at this time. VS stable, t.

## 2016-08-01 IMAGING — CR DG CHEST 2V
3 series · 3 of 3 positions shown · non-contrast
Comparison: 09/19/2013

CLINICAL DATA: Shortness of breath.  Onset 3 days ago.

EXAM:
CHEST  2 VIEW

[w chest lat]
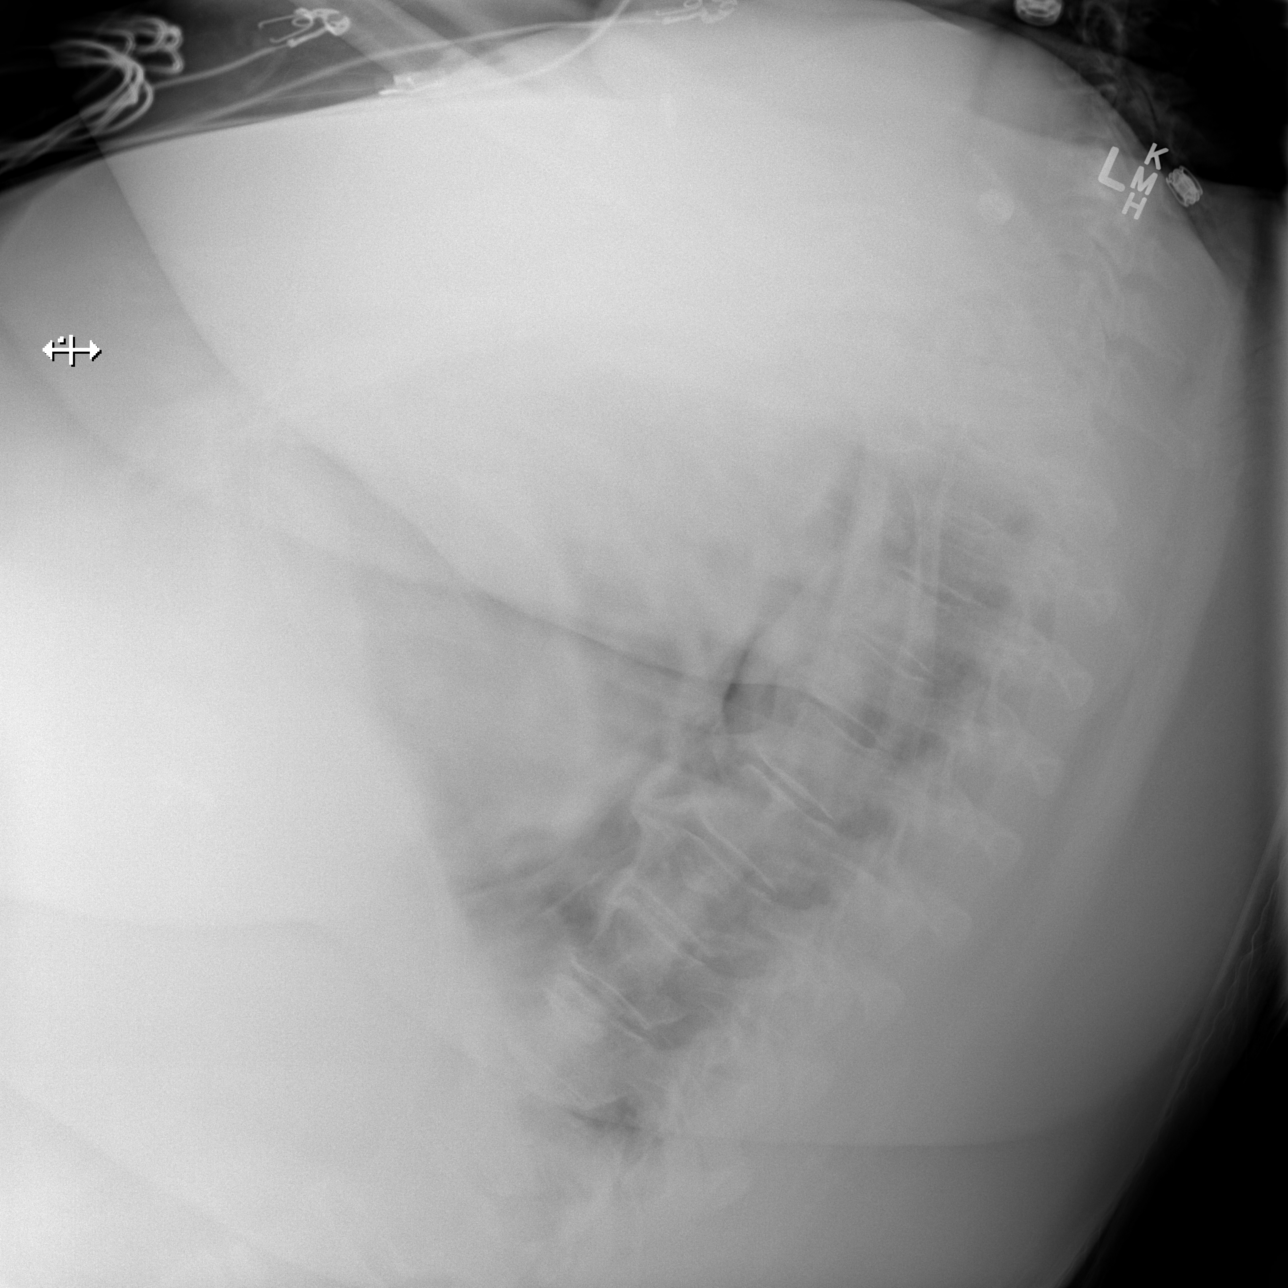

[x chest ap (1 of 2)]
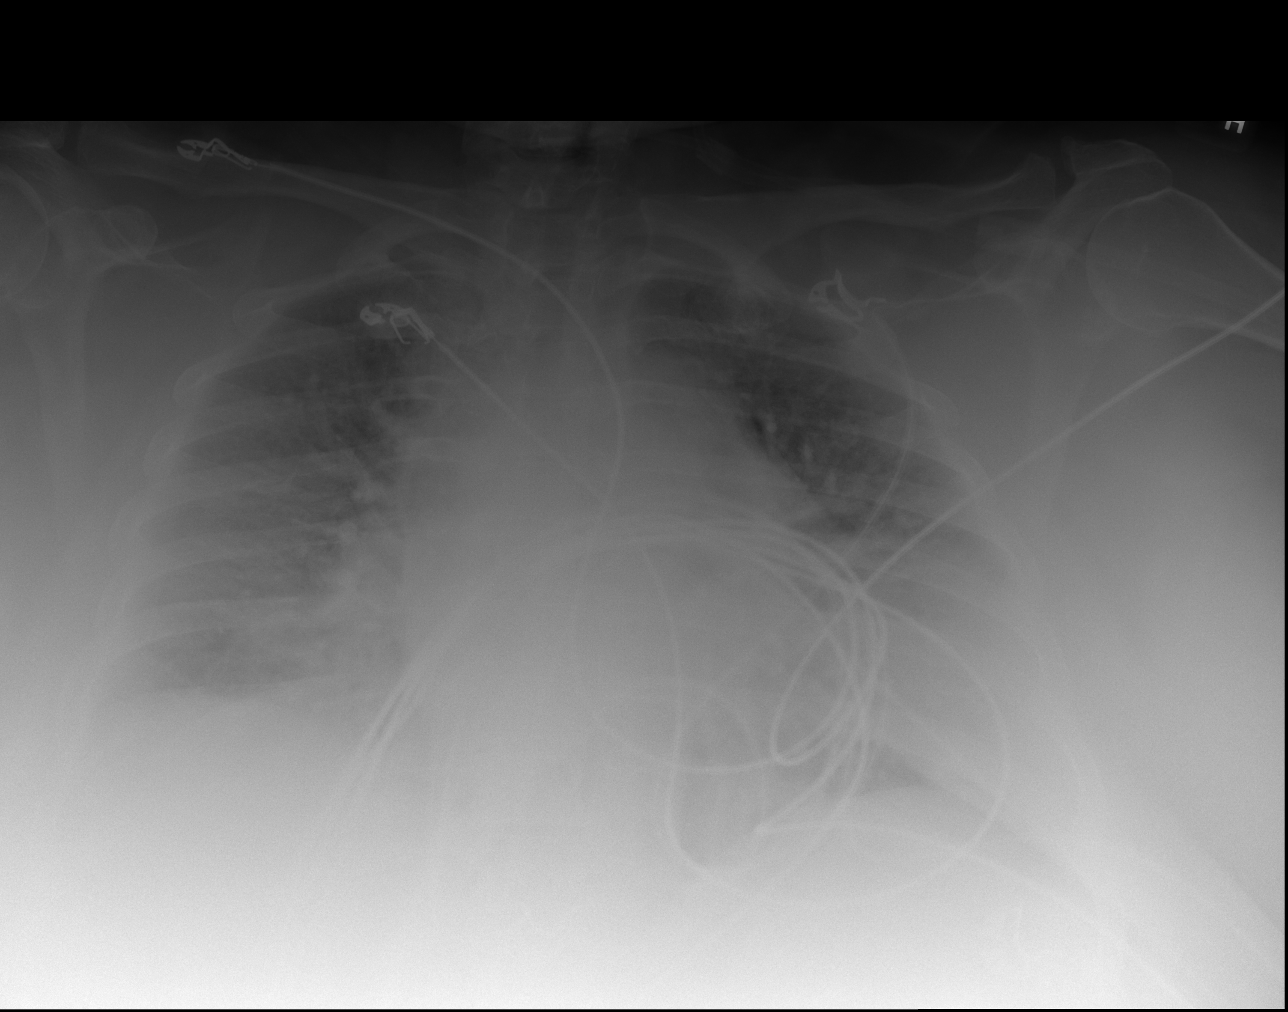

[x chest ap (2 of 2)]
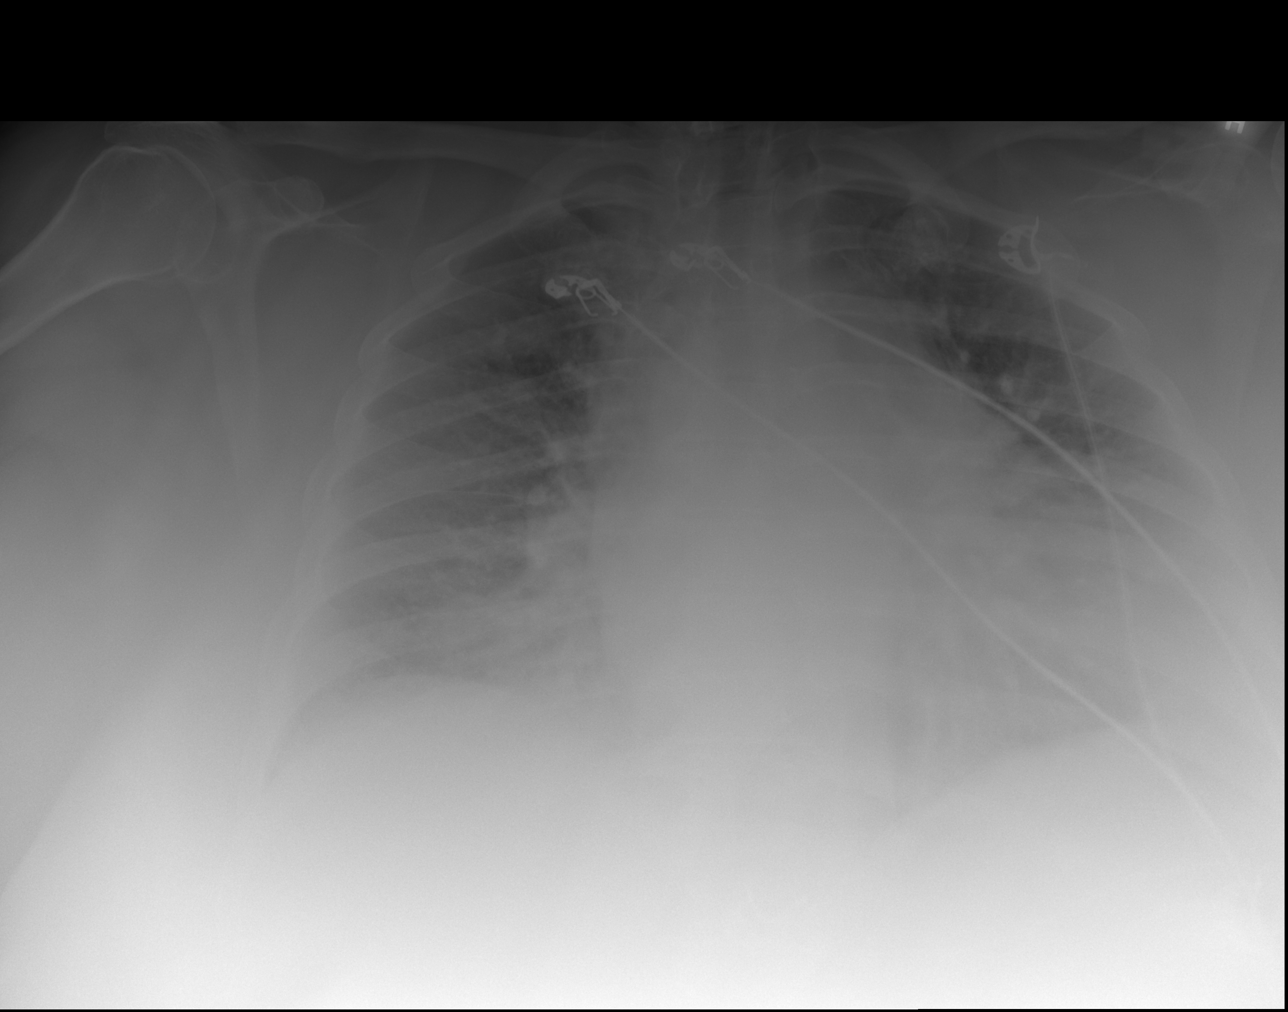

[3 of 3 positions shown; findings below may reference images not displayed]

FINDINGS: Cardiomegaly with vascular congestion and probable early
interstitial edema. Low lung volumes with bibasilar atelectasis. No
definite effusions. No acute bony abnormality.
IMPRESSION: Suspect mild interstitial edema/CHF. Low lung volumes with bibasilar
atelectasis.

## 2016-10-01 ENCOUNTER — Other Ambulatory Visit: Payer: Self-pay

## 2016-10-01 ENCOUNTER — Emergency Department (HOSPITAL_COMMUNITY): Payer: Medicaid Other

## 2016-10-01 DIAGNOSIS — R079 Chest pain, unspecified: Secondary | ICD-10-CM | POA: Diagnosis present

## 2016-10-01 DIAGNOSIS — Z79899 Other long term (current) drug therapy: Secondary | ICD-10-CM | POA: Diagnosis not present

## 2016-10-01 DIAGNOSIS — R2243 Localized swelling, mass and lump, lower limb, bilateral: Secondary | ICD-10-CM | POA: Insufficient documentation

## 2016-10-01 DIAGNOSIS — Z87891 Personal history of nicotine dependence: Secondary | ICD-10-CM | POA: Diagnosis not present

## 2016-10-01 DIAGNOSIS — I5033 Acute on chronic diastolic (congestive) heart failure: Secondary | ICD-10-CM | POA: Diagnosis not present

## 2016-10-01 DIAGNOSIS — I130 Hypertensive heart and chronic kidney disease with heart failure and stage 1 through stage 4 chronic kidney disease, or unspecified chronic kidney disease: Secondary | ICD-10-CM | POA: Diagnosis not present

## 2016-10-01 DIAGNOSIS — J4521 Mild intermittent asthma with (acute) exacerbation: Secondary | ICD-10-CM | POA: Insufficient documentation

## 2016-10-01 DIAGNOSIS — Z7982 Long term (current) use of aspirin: Secondary | ICD-10-CM | POA: Insufficient documentation

## 2016-10-01 DIAGNOSIS — N183 Chronic kidney disease, stage 3 (moderate): Secondary | ICD-10-CM | POA: Insufficient documentation

## 2016-10-01 LAB — POCT I-STAT TROPONIN I: Troponin i, poc: 0 ng/mL (ref 0.00–0.08)

## 2016-10-01 LAB — BASIC METABOLIC PANEL
Anion gap: 8 (ref 5–15)
BUN: 16 mg/dL (ref 6–20)
CO2: 32 mmol/L (ref 22–32)
CREATININE: 1.42 mg/dL — AB (ref 0.44–1.00)
Calcium: 8.4 mg/dL — ABNORMAL LOW (ref 8.9–10.3)
Chloride: 101 mmol/L (ref 101–111)
GFR calc Af Amer: 48 mL/min — ABNORMAL LOW (ref >60)
GFR, EST NON AFRICAN AMERICAN: 42 mL/min — AB (ref >60)
GLUCOSE: 123 mg/dL — AB (ref 65–99)
POTASSIUM: 4.4 mmol/L (ref 3.5–5.1)
SODIUM: 141 mmol/L (ref 135–145)

## 2016-10-01 LAB — CBC
HEMATOCRIT: 43.4 % (ref 36.0–46.0)
Hemoglobin: 13.5 g/dL (ref 12.0–15.0)
MCH: 29.8 pg (ref 26.0–34.0)
MCHC: 31.1 g/dL (ref 30.0–36.0)
MCV: 95.8 fL (ref 78.0–100.0)
PLATELETS: 195 10*3/uL (ref 150–400)
RBC: 4.53 MIL/uL (ref 3.87–5.11)
RDW: 14.6 % (ref 11.5–15.5)
WBC: 9.5 10*3/uL (ref 4.0–10.5)

## 2016-10-01 NOTE — ED Triage Notes (Signed)
Pt reports multiple c/o primarily stating 10/10 bilateral leg pain/swelling, 8/10 centralized cp, sob, cough, and 10/10 bilateral flank pain x3 days. Pt A+OX4, speaking in complete sentences, uses motorized wc for mobility.

## 2016-10-01 NOTE — ED Notes (Signed)
Patient transported to X-ray 

## 2016-10-02 ENCOUNTER — Emergency Department (HOSPITAL_COMMUNITY)
Admission: EM | Admit: 2016-10-02 | Discharge: 2016-10-02 | Disposition: A | Discharge status: Home / Self Care | Payer: Medicaid Other | Attending: Emergency Medicine | Admitting: Emergency Medicine

## 2016-10-02 DIAGNOSIS — J4521 Mild intermittent asthma with (acute) exacerbation: Secondary | ICD-10-CM

## 2016-10-02 DIAGNOSIS — I509 Heart failure, unspecified: Secondary | ICD-10-CM

## 2016-10-02 MED ORDER — IPRATROPIUM-ALBUTEROL 0.5-2.5 (3) MG/3ML IN SOLN
3.0000 mL | Freq: Once | RESPIRATORY_TRACT | Status: DC
  Filled 2016-10-02: qty 3

## 2016-10-02 MED ORDER — FUROSEMIDE 10 MG/ML IJ SOLN
40.0000 mg | Freq: Once | INTRAMUSCULAR | Status: AC
  Administered 2016-10-02: 40 mg via INTRAVENOUS
  Filled 2016-10-02: qty 4

## 2016-10-02 NOTE — Discharge Instructions (Signed)
The results in the ER show mild CHF worsening - and the treatment will be to increase your lasix from 2 times a day to 3 times daily for 1 week.  Please see your primary doctor in 1 week.

## 2016-10-02 NOTE — ED Provider Notes (Signed)
[facility] DEPT Provider Note   CSN: [id number] Arrival date & time: 10/01/16  2308  By signing my name below, I, Collene Leyden, attest that this documentation has been prepared under the direction and in the presence of Varney Biles, MD. Electronically Signed: Collene Leyden, Scribe. 10/02/16. 2:36 AM.  History   Chief Complaint Chief Complaint  Patient presents with  . Chest Pain   HPI Comments: Kara Vincent is a 51 y.o. female with a history of congestive heart failure, CKD, HLD, and HTN, who presents to the Emergency Department complaining of persistent, gradually worsening shortness of breath that began three days ago. Patient reports developing gradually worsening intermittent leg swelling, central chest pain, and shortness of breath. Patient states her symptoms are similar to her prior asthma, but she also noted leg swelling and concerns for CHF. Patient is currently on Lasix 40 mg, which she has been compliant with. Patient reports associated cough with phlegm, and intermittent wheezing. Patient denies any history of blood clots, recent travel, or recent surgeries. Patient denies any dysuria, hematuria, nausea, vomiting, diarrhea, abdominal pain, or any additional symptoms. Pt uses 1 pillow to go sleep and denies ant worsening orthopnea or PND.   The history is provided by the patient. No language interpreter was used.    Past Medical History:  Diagnosis Date  . Allergy   . Anxiety   . Arthritis   . Asthma   . Asthma   . CHF (congestive heart failure) ([place])   . CHF (congestive heart failure) ([place])   . Chronic abdominal pain   . Chronic kidney disease   . Chronic pain    "all over"  . 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 GERD (gastroesophageal reflux disease)   . Gout   . Hidradenitis   . HLD (hyperlipidemia) 12/14/2015  . Homelessness   . Hyperlipemia   . Hypertension   . Low back pain   . Morbid obesity ([place])    uses motor wheel chair  .  MRSA (methicillin resistant Staphylococcus aureus)    states about a year ago  . On home O2    3L N/C O2 continuously  . OSA (obstructive sleep apnea)    non-compliant with CPAP  . Oxygen deficiency   . Renal insufficiency   . Sleep apnea     Patient Active Problem List   Diagnosis Date Noted  . Acute pulmonary edema ([place]) 12/14/2015  . Essential hypertension 12/14/2015  . HLD (hyperlipidemia) 12/14/2015  . GERD (gastroesophageal reflux disease) 12/14/2015  . Gout 12/14/2015  . Acute on chronic diastolic (congestive) heart failure ([place]) 12/14/2015  . Chest pain 12/14/2015  . Shortness of breath 06/09/2015  . Atypical chest pain   . Darier's disease   . Leukocytosis   . 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 Acute bronchitis 11/20/2014  . 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 OSA (obstructive sleep apnea)   . CKD (chronic kidney disease) stage 3, GFR 30-59 ml/min 11/17/2014  . Asthma exacerbation 11/16/2014  . Venous stasis 11/16/2014  . Acute on chronic respiratory failure with hypoxia ([place]) 11/16/2014  . Hypotension 08/08/2012  . Chronic pain 08/24/2011  . Morbid obesity ([place]) 08/24/2011    Past Surgical History:  Procedure Laterality Date  . BACK SURGERY    . CESAREAN SECTION    . CESAREAN SECTION    . FRACTURE SURGERY    . TUBAL LIGATION      OB History    [place]  Para Term Preterm AB Living   0 0 0 0 0     SAB TAB Ectopic Multiple Live Births   0 0 0           Home Medications    Prior to Admission medications   Medication Sig Start Date End Date Taking? Authorizing Provider  albuterol (PROVENTIL HFA;VENTOLIN HFA) 108 (90 Base) MCG/ACT inhaler Inhale 1-2 puffs into the lungs every 6 (six) hours as needed for wheezing or shortness of breath.   Yes [provider]  albuterol (PROVENTIL) (2.5 MG/3ML) 0.083% nebulizer solution Take 2.5 mg by nebulization every 6 (six) hours as needed for wheezing or shortness of breath.   Yes [provider]   allopurinol (ZYLOPRIM) 100 MG tablet Take 1 tablet (100 mg total) by mouth every morning. 01/22/14  Yes Donne Hazel, MD  ALPRAZolam Duanne Moron) 1 MG tablet Take 1 mg by mouth 3 (three) times daily as needed for anxiety (anxiety).    Yes [provider]  ammonium lactate (AMLACTIN) 12 % cream Apply 1 g topically daily as needed for dry skin. 04/22/14  Yes Linton Flemings, MD  aspirin 325 MG tablet Take 325 mg by mouth every morning.    Yes [provider]  atorvastatin (LIPITOR) 20 MG tablet Take 1 tablet (20 mg total) by mouth every morning. 06/11/15  Yes Lavina Hamman, MD  butalbital-acetaminophen-caffeine (FIORICET, ESGIC) [phone] MG tablet Take 1 tablet by mouth every 6 (six) hours as needed for headache. 06/11/15  Yes Lavina Hamman, MD  Emollient (EUCERIN) lotion Apply 5 mLs topically 2 (two) times daily as needed for dry skin. Patient taking differently: Apply 5 mLs topically 3 (three) times daily.  04/22/14  Yes Linton Flemings, MD  folic acid (FOLVITE) 1 MG tablet Take 1 tablet (1 mg total) by mouth daily. 01/22/14  Yes Donne Hazel, MD  furosemide (LASIX) 20 MG tablet Take 1 tablet (20 mg total) by mouth daily. 12/25/15  Yes Hosie Poisson, MD  HYDROcodone-acetaminophen (NORCO) 10-325 MG tablet Take 1 tablet by mouth every 4 (four) hours as needed for moderate pain. 12/24/15  Yes Hosie Poisson, MD  hydrOXYzine (ATARAX/VISTARIL) 25 MG tablet Take 25 mg by mouth 3 (three) times daily as needed for anxiety or itching (anxiety and itching).    Yes [provider]  indomethacin (INDOCIN SR) 75 MG CR capsule Take 75 mg by mouth 2 (two) times daily with a meal.   Yes [provider]  ipratropium-albuterol (DUONEB) 0.5-2.5 (3) MG/3ML SOLN Take 3 mLs by nebulization every 6 (six) hours as needed. Patient taking differently: Take 3 mLs by nebulization every 6 (six) hours as needed (SOB).  12/24/15  Yes Hosie Poisson, MD  isosorbide mononitrate (IMDUR) 30 MG 24 hr tablet Take  30 mg by mouth daily. 07/27/15  Yes [provider]  lisinopril (PRINIVIL,ZESTRIL) 40 MG tablet Hold medication for one week, follow up with PCP , check renal function, if its back to baseline, can start taking it. 12/24/15  Yes Hosie Poisson, MD  methocarbamol (ROBAXIN) 500 MG tablet Take 1 tablet (500 mg total) by mouth every 8 (eight) hours as needed for muscle spasms. 06/11/15  Yes Lavina Hamman, MD  Omega-3 Fatty Acids (FISH OIL PO) Take 1 capsule by mouth 2 (two) times daily.   Yes [provider]  pantoprazole (PROTONIX) 40 MG tablet Take 1 tablet (40 mg total) by mouth every morning. 06/11/15  Yes Lavina Hamman, MD  solifenacin (  VESICARE) 5 MG tablet Take 5 mg by mouth every morning.    Yes [provider]  VITAMIN E PO Take 1 capsule by mouth daily.   Yes [provider]  benzonatate (TESSALON) 100 MG capsule Take 1 capsule (100 mg total) by mouth 3 (three) times daily as needed for cough. Patient not taking: Reported on 10/02/2016 06/11/15   Lavina Hamman, MD  budesonide-formoterol [facility]) 160-4.5 MCG/ACT inhaler Inhale 2 puffs into the lungs 2 (two) times daily. Patient not taking: Reported on 10/02/2016 11/21/14   Debbe Odea, MD  dextromethorphan-guaiFENesin [facility]) 30-600 MG 12hr tablet Take 1 tablet by mouth 2 (two) times daily. Patient not taking: Reported on 10/02/2016 06/11/15   Lavina Hamman, MD  gabapentin (NEURONTIN) 100 MG capsule Take 1 capsule (100 mg total) by mouth 2 (two) times daily. Patient not taking: Reported on 10/02/2016 06/11/15   Lavina Hamman, MD  levofloxacin (LEVAQUIN) 750 MG tablet Take 1 tablet (750 mg total) by mouth daily. X 7 days Patient not taking: Reported on 10/02/2016 07/19/16   Everlene Balls, MD    Family History Family History  Problem Relation Age of Onset  . Asthma Mother   . Diabetes Father   . Stroke Father   . Heart disease Father   . Arthritis Sister   . Asthma Sister   . Asthma Daughter   .  Asthma Son   . Arthritis Sister   . Asthma Sister     Social History Social History  Substance Use Topics  . Smoking status: Former Smoker    Types: Cigarettes    Quit date: 03/25/2003  . Smokeless tobacco: Former Systems developer    Quit date: 05/27/1978  . Alcohol use 0.0 oz/week     Comment: years ago  no longer beer only  early 20's     Allergies   Ibuprofen; Penicillins; Sulfa antibiotics; Doxycycline; Ibuprofen; Zithromax [azithromycin]; Adhesive [tape]; Adhesive [tape]; Azithromycin; Doxycycline; Penicillins; Sulfa antibiotics; and Sulfamethoxazole-trimethoprim   Review of Systems Review of Systems  Constitutional: Positive for diaphoresis. Negative for chills and fever.  Respiratory: Positive for cough, shortness of breath and wheezing.   Cardiovascular: Positive for chest pain and leg swelling.  Gastrointestinal: Negative for abdominal pain, diarrhea, nausea and vomiting.  Genitourinary: Negative for dysuria and hematuria.  All other systems reviewed and are negative.    Physical Exam Updated Vital Signs BP (!) 119/57 (BP Location: Right Wrist)   Pulse 69   Temp 98.3 F (36.8 C) (Oral)   Resp 20   LMP 03/24/2009   SpO2 90%   Physical Exam  Constitutional: She is oriented to person, place, and time. She appears well-developed and well-nourished.  HENT:  Head: Normocephalic and atraumatic.  Eyes: Pupils are equal, round, and reactive to light. EOM are normal.  Neck: Normal range of motion. Neck supple. No JVD present.  Cardiovascular: Normal rate and regular rhythm.   RRR   Pulmonary/Chest: Effort normal and breath sounds normal. She has no wheezes.  Bibasilar rales, otherwise clear  Abdominal: Soft. Bowel sounds are normal.  Abdomen is soft and non-tender.   Musculoskeletal: Normal range of motion. She exhibits tenderness. She exhibits no edema or deformity.  Minimal/trace pitting edema. No calf swelling, positive calf tenderness on palpation bilaterally    Neurological: She is alert and oriented to person, place, and time.  Skin: Skin is warm and dry.  Psychiatric: She has a normal mood and affect.  Nursing note and vitals reviewed.  ED Treatments / Results  DIAGNOSTIC STUDIES: Oxygen Saturation is 94% on RA, adequate by my interpretation.    COORDINATION OF CARE: 2:36 AM Discussed treatment plan with pt at bedside and pt agreed to plan.   Labs (all labs ordered are listed, but only abnormal results are displayed) Labs Reviewed  BASIC METABOLIC PANEL - Abnormal; Notable for the following:       Result Value   Glucose, Bld 123 (*)    Creatinine, Ser 1.42 (*)    Calcium 8.4 (*)    GFR calc non Af Amer 42 (*)    GFR calc Af Amer 48 (*)    All other components within normal limits  CBC  I-STAT TROPOININ, ED  POCT I-STAT TROPONIN I    EKG  EKG Interpretation  Date/Time:  Wednesday October 01 2016 23:29:15 EDT Ventricular Rate:  76 PR Interval:    QRS Duration: 105 QT Interval:  403 QTC Calculation: 454 R Axis:   49 Text Interpretation:  Sinus arrhythmia Low voltage, precordial leads No acute changes No significant change since last tracing Confirmed by Varney Biles [phone]) on 10/02/2016 1:07:51 AM       Radiology Dg Chest 2 View  Result Date: 10/02/2016 CLINICAL DATA:  Fever and chest pain EXAM: CHEST  2 VIEW COMPARISON:  07/18/2016 FINDINGS: No pleural effusion. There is cardiomegaly. No focal consolidation. Mild central vascular congestion. No pneumothorax. Degenerative changes of the spine. IMPRESSION: 1. Cardiomegaly with mild central vascular congestion. No focal pulmonary infiltrate is seen. Electronically Signed   By: Donavan Foil M.D.   On: 10/02/2016 00:08    Procedures Procedures (including critical care time)  Medications Ordered in ED Medications  ipratropium-albuterol (DUONEB) 0.5-2.5 (3) MG/3ML nebulizer solution 3 mL (not administered)  furosemide (LASIX) injection 40 mg (40 mg Intravenous Given  10/02/16 0418)     Initial Impression / Assessment and Plan / ED Course  I have reviewed the triage vital signs and the nursing notes.  Pertinent labs & imaging results that were available during my care of the patient were reviewed by me and considered in my medical decision making (see chart for details).  Clinical Course as of Oct 02 612  Thu Oct 02, 2016  0129 IMPRESSION: No ventilation or perfusion defects evident. Very low probability of pulmonary embolus.   Electronically Signed   By: Lowella Grip III M.D.   On: 12/14/2015 16:13  [AN]  0129  IMPRESSION: 1. No central pulmonary embolus in the main or lobar pulmonary arteries. The segmental and subsegmental branches cannot be assessed. 2. No acute findings.  Cardiomegaly and scattered atelectasis.   Electronically Signed   By: Jeb Levering M.D.   On: 11/15/2014 23:42   [AN]  0130 Study Conclusions  - Left ventricle: The cavity size was normal. Systolic function was   normal. The estimated ejection fraction was in the range of 60%   to 65%. Wall motion was normal; there were no regional wall   motion abnormalities. Doppler parameters are consistent with   abnormal left ventricular relaxation (grade 1 diastolic   dysfunction). There was no evidence of elevated ventricular   filling pressure by Doppler parameters. - Aortic valve: There was no regurgitation. - Aortic root: The aortic root was normal in size. - Mitral valve: Structurally normal valve. There was no   regurgitation. - Right ventricle: The cavity size was normal. Wall thickness was   normal. Systolic function was normal. - Right atrium: The atrium was normal  in size. - Tricuspid valve: There was mild regurgitation. - Pulmonic valve: There was no regurgitation. - Pulmonary arteries: Systolic pressure was within the normal   range. - Inferior vena cava: The vessel was normal in size. - Pericardium, extracardiac: There was no pericardial  effusion.   [AN]    Clinical Course User Index [AN] Varney Biles, MD    Pt comes in with cc of DIB. Pt has hx of CHF, Asthma, she is morbidly obese. Pt has mild rales on exam, she reports wheezing - but I didn't hear any wheezing. Pt has cough, but there is no fevers, no elevated WC, no focal rhonchi on lung exam or focal infiltrate - so clinically there is no pneumonia. Pt likely has mild CHF. CXR does show mild interstitial edema. Pt has no hypoxic resp failure, she is not in resp distress and she has no orthopnea / PND or lab abnormalities consistent with acute decompensated CHF. Pt therefore given lasix iv - and she responded well. We also considered PE in the ddx - but pt has no clear signs of DVT, she has had multiple PE workup that are neg - and at this time, the  Suspicion for PE as the cause is lot lower than CHF exacerbation - especially with the CXR showing clear interstitial edema.  Results from the ER workup discussed with the patient face to face and all questions answered to the best of my ability.  Pt advised to take lasix 3 times a day for 1 week and to see her pcp..  Strict ER return precautions have been discussed, and patient is agreeing with the plan and is comfortable with the workup done and the recommendations from the ER.   Final Clinical Impressions(s) / ED Diagnoses   Final diagnoses:  Acute on chronic congestive heart failure, unspecified heart failure type (HCC)  Mild intermittent asthma with exacerbation    New Prescriptions New Prescriptions   No medications on file   I personally performed the services described in this documentation, which was scribed in my presence. The recorded information has been reviewed and is accurate.     Varney Biles, MD 10/02/16 [phone]

## 2016-10-13 IMAGING — CR DG CHEST 2V
2 series · 2 of 2 positions shown · non-contrast
Comparison: Chest radiograph performed 01/13/2014

CLINICAL DATA: Subacute onset of cough and shortness of breath for
2 weeks. Initial encounter.

EXAM:
CHEST  2 VIEW

[w chest pa]
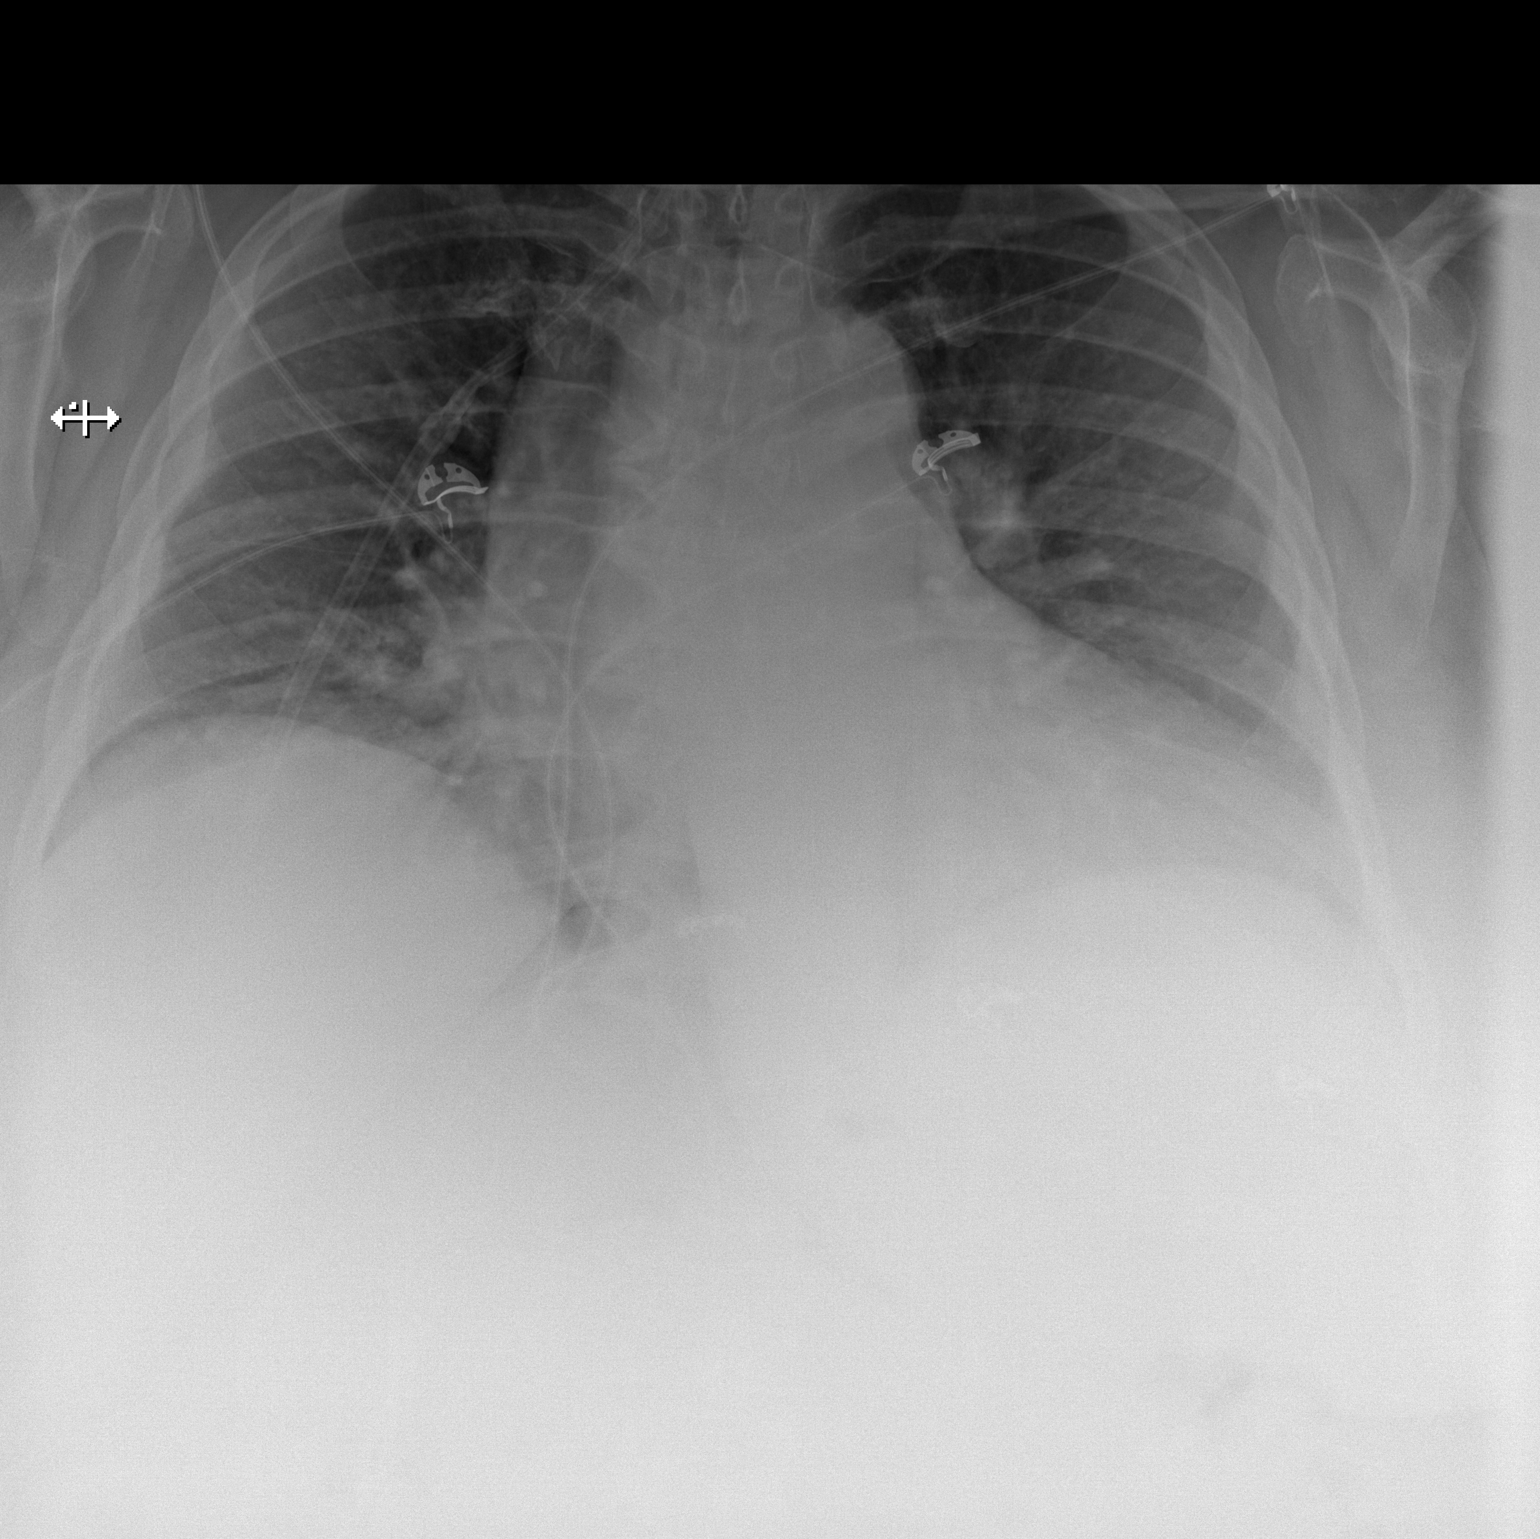

[w chest lat]
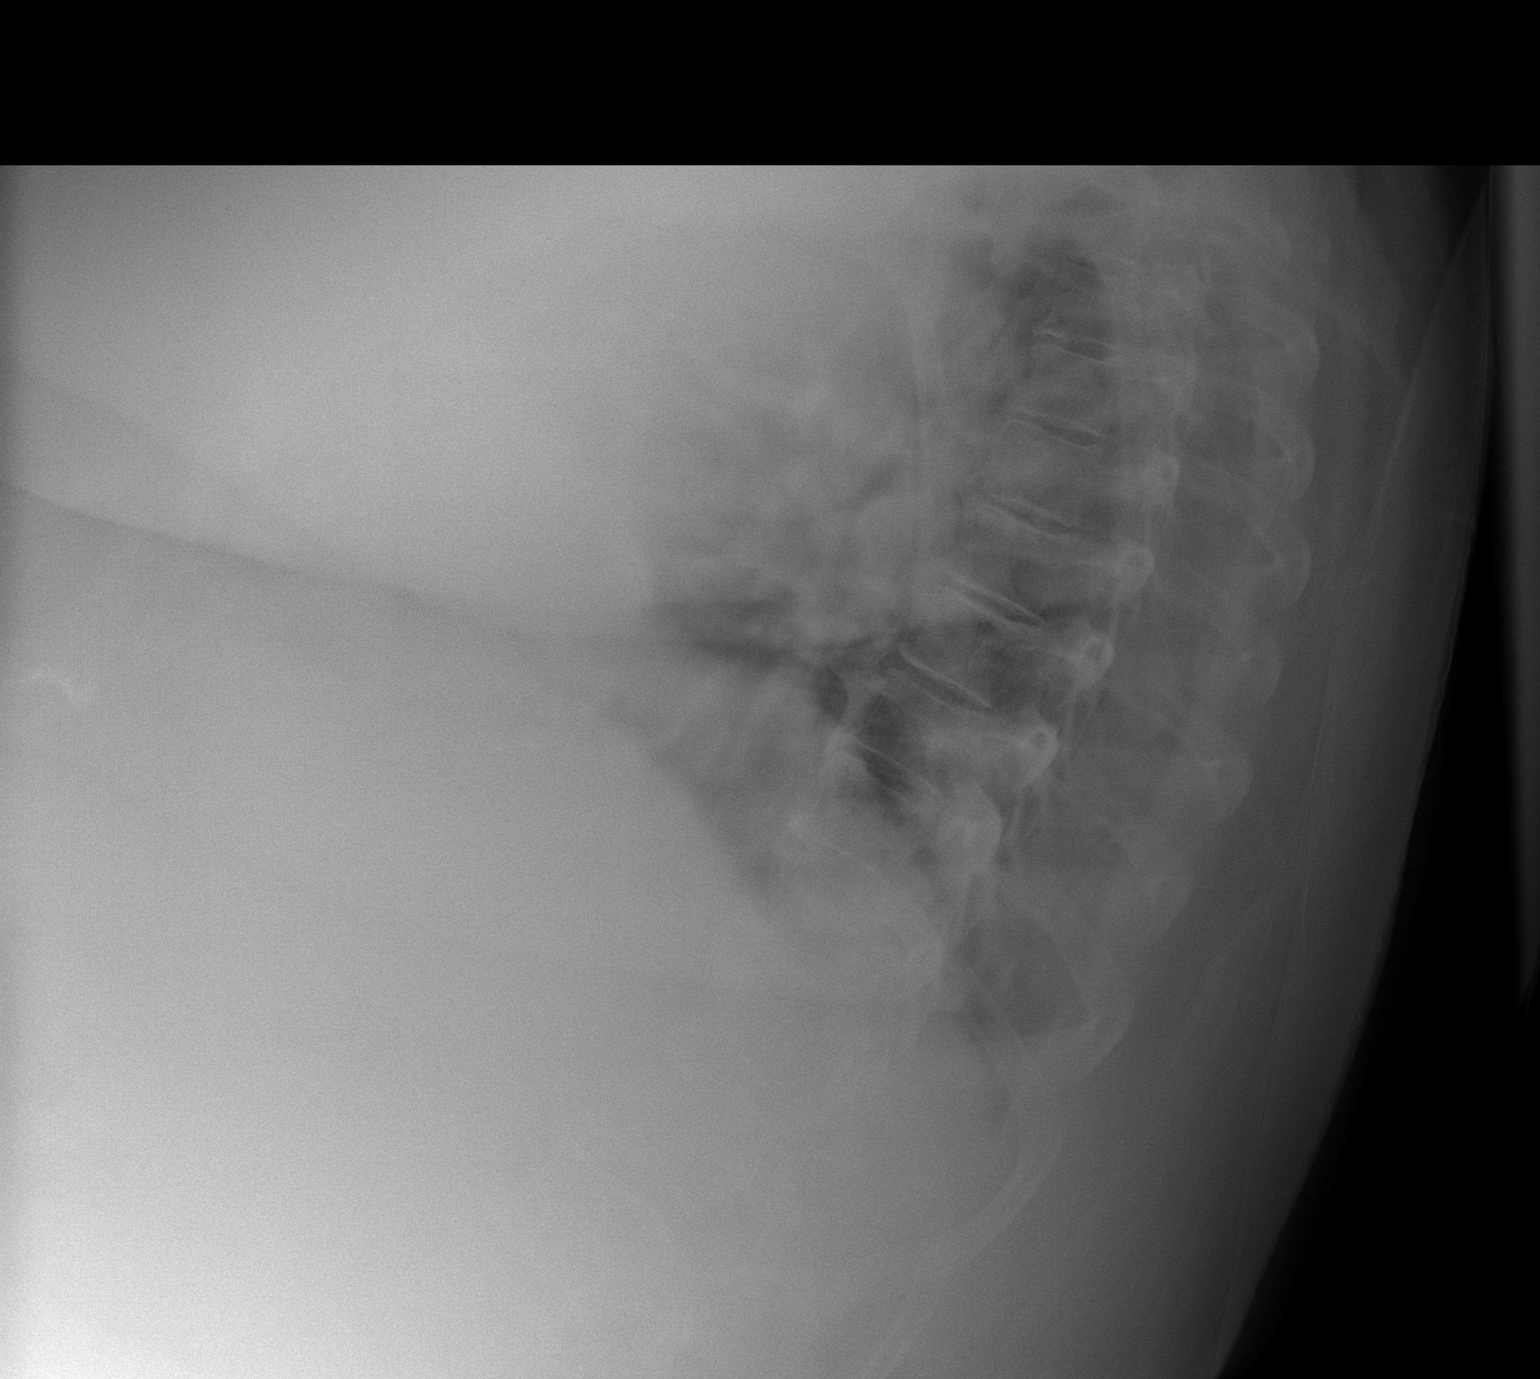

[2 of 2 positions shown; findings below may reference images not displayed]

FINDINGS: The lungs are hypoexpanded. Vascular congestion is noted. Mild
bibasilar opacities could reflect minimal interstitial edema. There
is no evidence of pleural effusion or pneumothorax.

The heart is mildly enlarged. No acute osseous abnormalities are
seen.
IMPRESSION: Lungs hypoexpanded. Vascular congestion and mild cardiomegaly. Mild
bibasilar airspace opacities could reflect minimal interstitial
edema.

## 2016-11-08 IMAGING — CR DG CHEST 2V
2 series · 2 of 2 positions shown · non-contrast
Comparison: 03/27/2014

CLINICAL DATA: Body swelling, obesity, shortness of breath

EXAM:
CHEST  2 VIEW

[w chest lat]
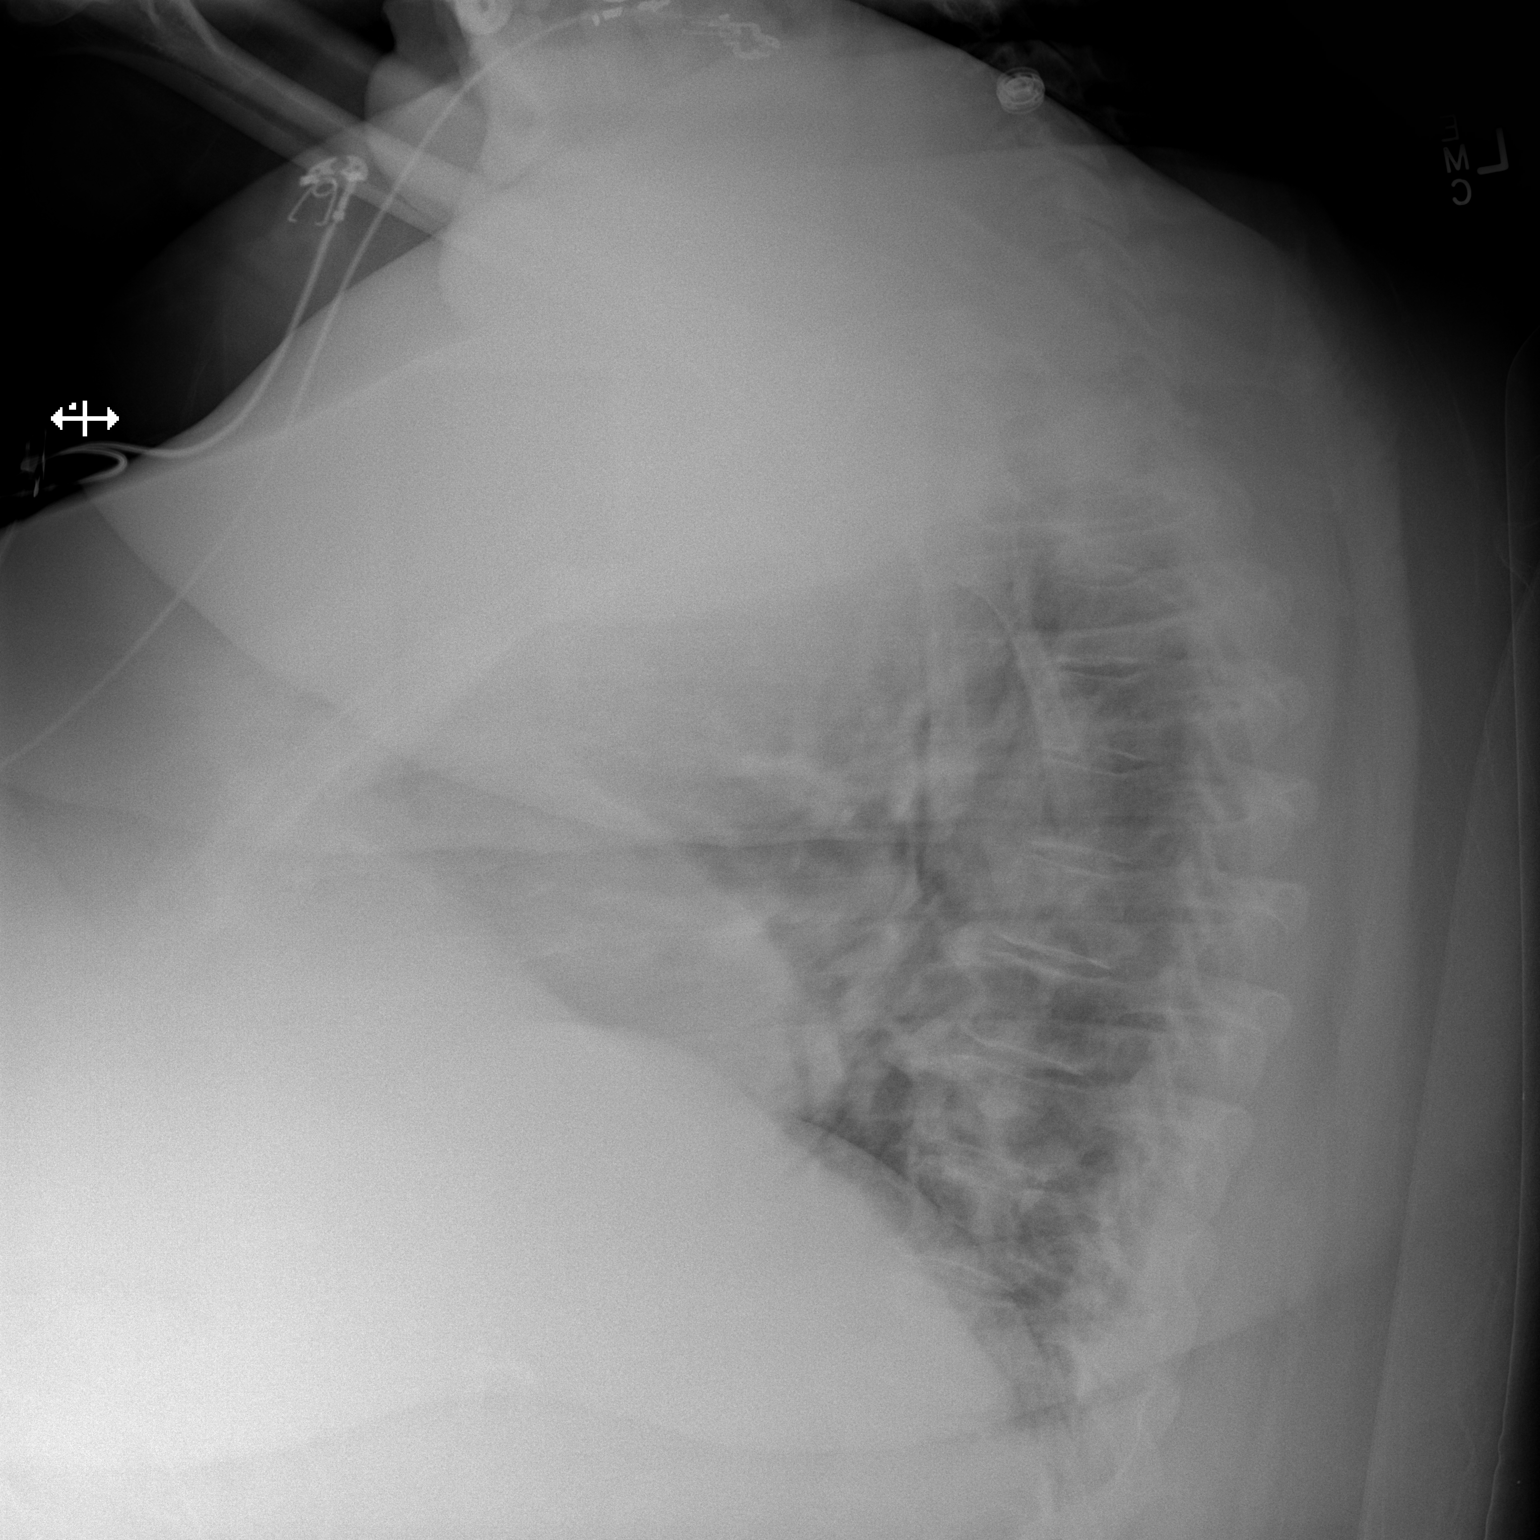

[x chest ap]
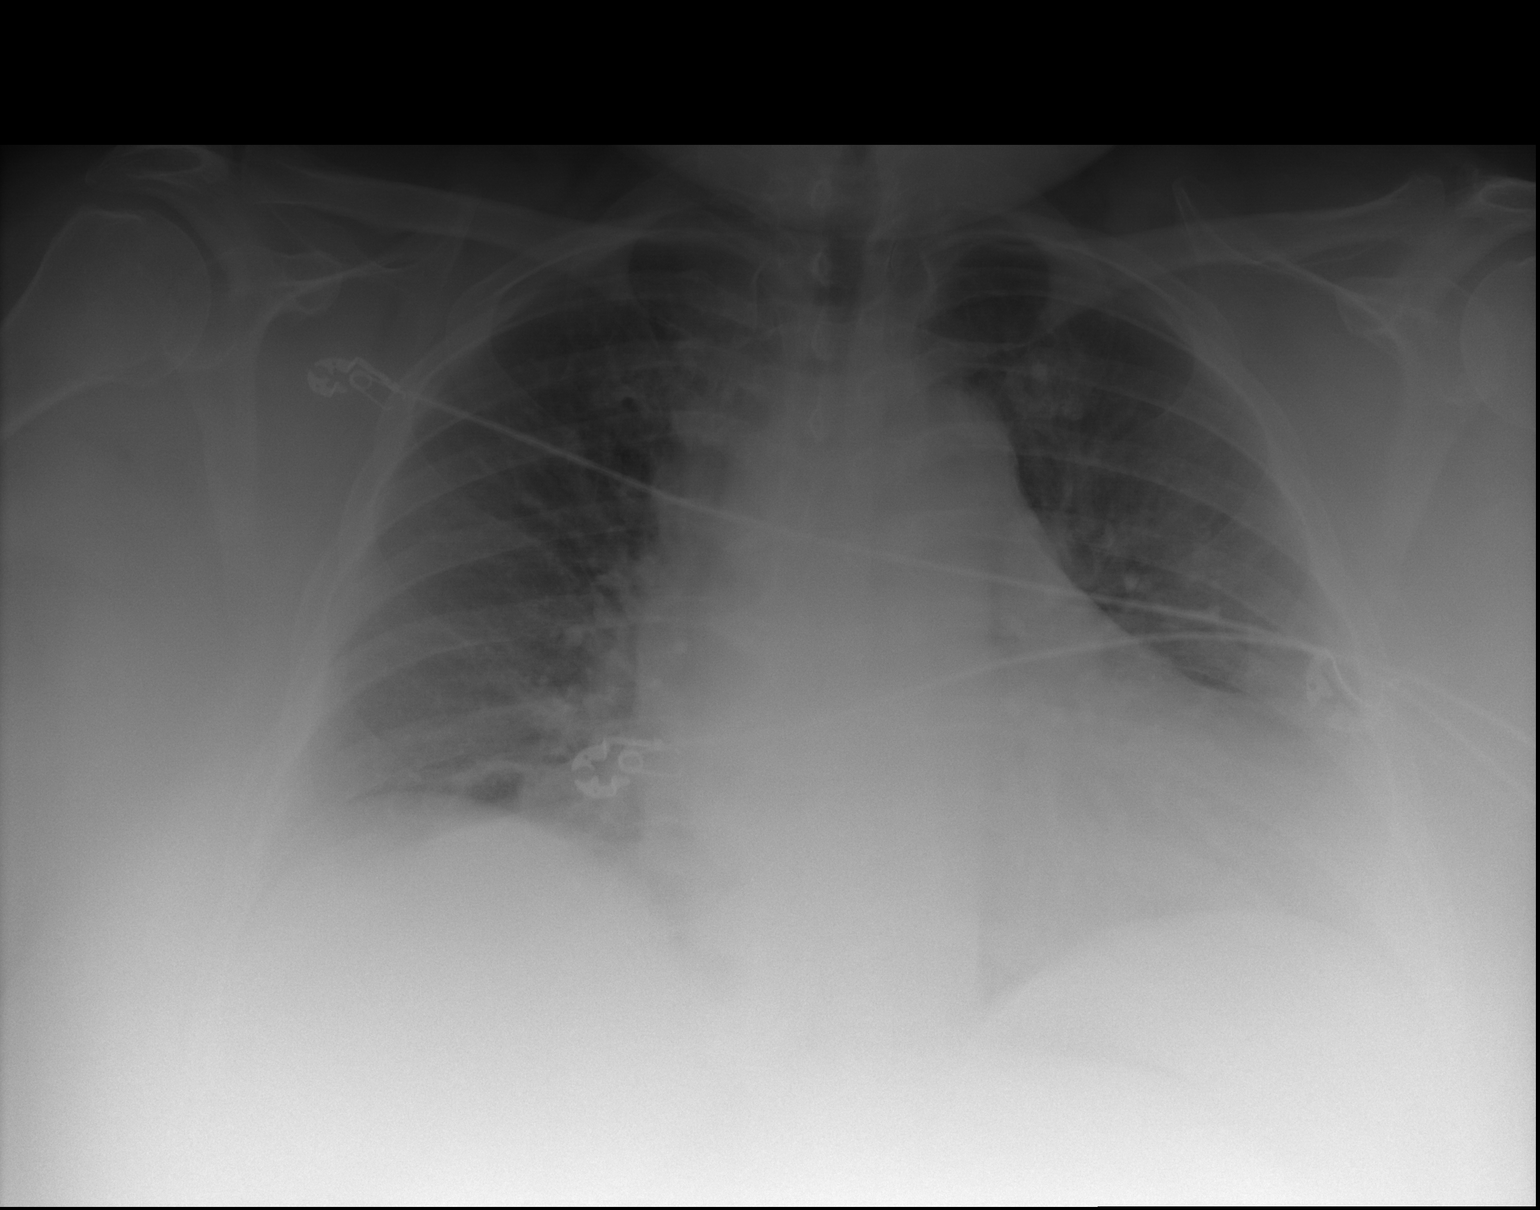

[2 of 2 positions shown; findings below may reference images not displayed]

FINDINGS: There is no focal parenchymal opacity, pleural effusion, or
pneumothorax. There is stable cardiomegaly.

The osseous structures are unremarkable.
IMPRESSION: No active cardiopulmonary disease.

## 2016-12-30 IMAGING — DX DG CHEST 2V
2 series · 2 of 2 positions shown · non-contrast
Comparison: 05/18/2014

CLINICAL DATA: Productive cough

EXAM:
CHEST  2 VIEW

[chest ap]
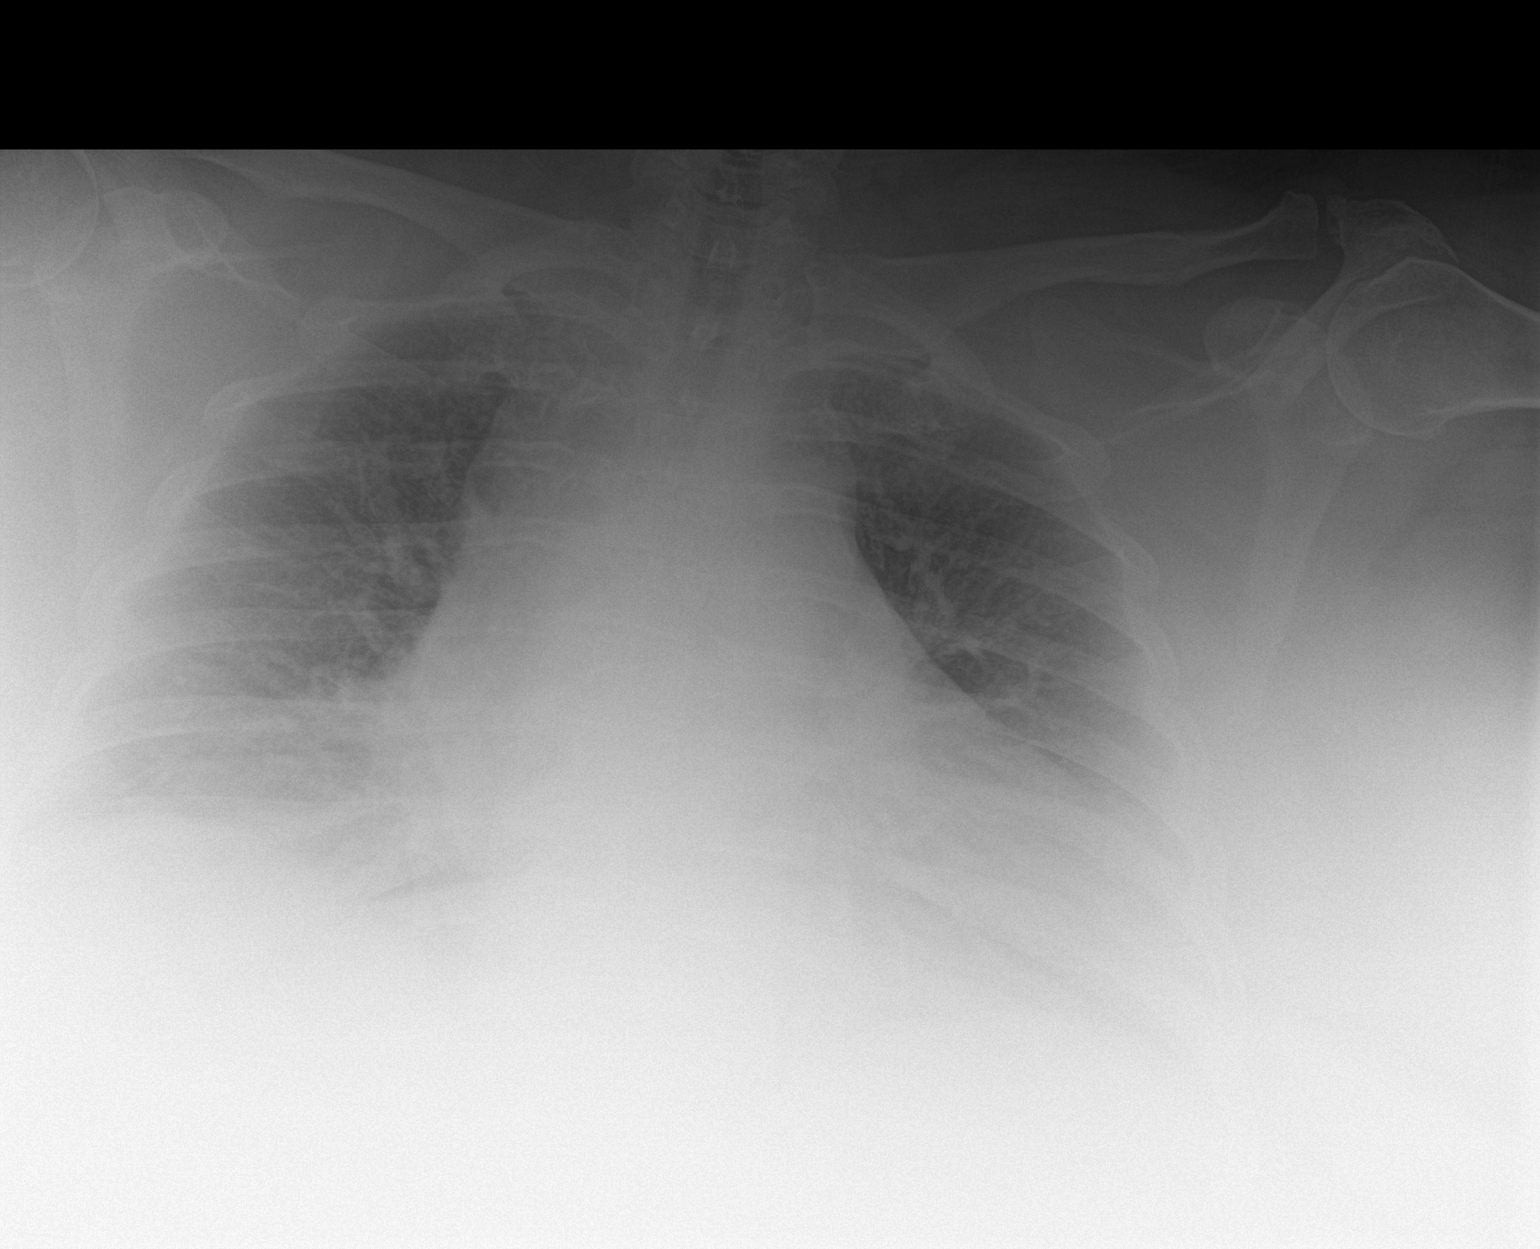

[chest lat]
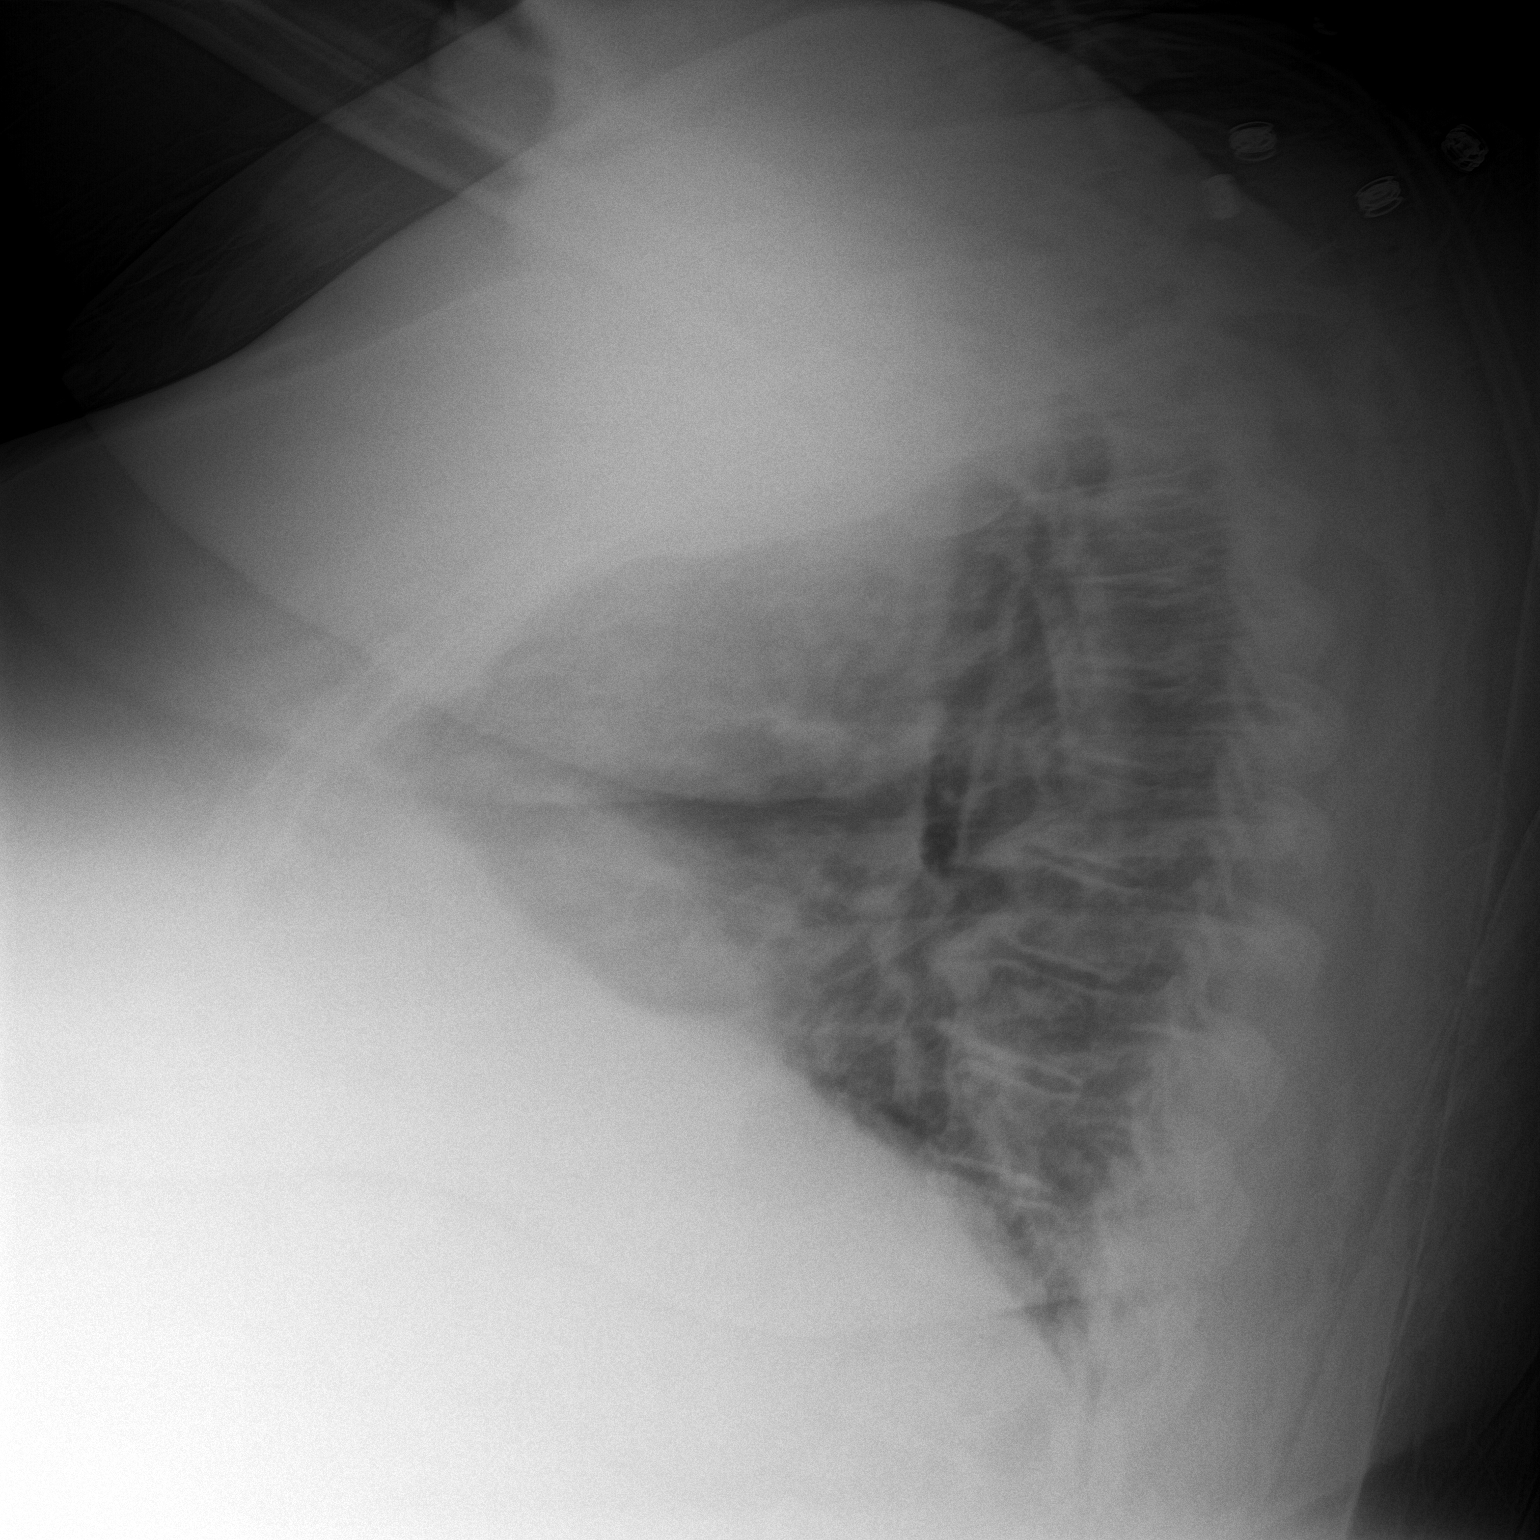

[2 of 2 positions shown; findings below may reference images not displayed]

FINDINGS: There is cardiomegaly, unchanged. Mild vascular prominence is also
unchanged. No large effusion is evident. No dense airspace
consolidation is evident.
IMPRESSION: Cardiomegaly and vascular prominence, possibly chronic. No dense
consolidation or large effusion.

## 2017-01-21 ENCOUNTER — Encounter (HOSPITAL_COMMUNITY): Payer: Self-pay

## 2017-01-21 ENCOUNTER — Inpatient Hospital Stay (HOSPITAL_COMMUNITY)
Admission: EM | Admit: 2017-01-21 | Discharge: 2017-01-23 | DRG: 291 | Disposition: A | Discharge status: Home / Self Care | Payer: Medicaid Other | Attending: Family Medicine | Admitting: Family Medicine

## 2017-01-21 ENCOUNTER — Emergency Department (HOSPITAL_COMMUNITY): Payer: Medicaid Other

## 2017-01-21 DIAGNOSIS — Z881 Allergy status to other antibiotic agents status: Secondary | ICD-10-CM

## 2017-01-21 DIAGNOSIS — Z886 Allergy status to analgesic agent status: Secondary | ICD-10-CM

## 2017-01-21 DIAGNOSIS — M109 Gout, unspecified: Secondary | ICD-10-CM | POA: Diagnosis present

## 2017-01-21 DIAGNOSIS — J45909 Unspecified asthma, uncomplicated: Secondary | ICD-10-CM | POA: Diagnosis present

## 2017-01-21 DIAGNOSIS — B373 Candidiasis of vulva and vagina: Secondary | ICD-10-CM | POA: Diagnosis present

## 2017-01-21 DIAGNOSIS — Z882 Allergy status to sulfonamides status: Secondary | ICD-10-CM

## 2017-01-21 DIAGNOSIS — Z7951 Long term (current) use of inhaled steroids: Secondary | ICD-10-CM

## 2017-01-21 DIAGNOSIS — G4733 Obstructive sleep apnea (adult) (pediatric): Secondary | ICD-10-CM | POA: Diagnosis present

## 2017-01-21 DIAGNOSIS — Z823 Family history of stroke: Secondary | ICD-10-CM

## 2017-01-21 DIAGNOSIS — I130 Hypertensive heart and chronic kidney disease with heart failure and stage 1 through stage 4 chronic kidney disease, or unspecified chronic kidney disease: Principal | ICD-10-CM | POA: Diagnosis present

## 2017-01-21 DIAGNOSIS — Z825 Family history of asthma and other chronic lower respiratory diseases: Secondary | ICD-10-CM

## 2017-01-21 DIAGNOSIS — E662 Morbid (severe) obesity with alveolar hypoventilation: Secondary | ICD-10-CM | POA: Diagnosis present

## 2017-01-21 DIAGNOSIS — Z8261 Family history of arthritis: Secondary | ICD-10-CM

## 2017-01-21 DIAGNOSIS — I5033 Acute on chronic diastolic (congestive) heart failure: Secondary | ICD-10-CM | POA: Diagnosis present

## 2017-01-21 DIAGNOSIS — F419 Anxiety disorder, unspecified: Secondary | ICD-10-CM | POA: Diagnosis present

## 2017-01-21 DIAGNOSIS — Z9981 Dependence on supplemental oxygen: Secondary | ICD-10-CM

## 2017-01-21 DIAGNOSIS — R0902 Hypoxemia: Secondary | ICD-10-CM | POA: Diagnosis present

## 2017-01-21 DIAGNOSIS — N183 Chronic kidney disease, stage 3 (moderate): Secondary | ICD-10-CM | POA: Diagnosis present

## 2017-01-21 DIAGNOSIS — Z880 Allergy status to penicillin: Secondary | ICD-10-CM

## 2017-01-21 DIAGNOSIS — Z23 Encounter for immunization: Secondary | ICD-10-CM

## 2017-01-21 DIAGNOSIS — Z8249 Family history of ischemic heart disease and other diseases of the circulatory system: Secondary | ICD-10-CM

## 2017-01-21 DIAGNOSIS — R0602 Shortness of breath: Secondary | ICD-10-CM

## 2017-01-21 DIAGNOSIS — Z7982 Long term (current) use of aspirin: Secondary | ICD-10-CM

## 2017-01-21 DIAGNOSIS — B3731 Acute candidiasis of vulva and vagina: Secondary | ICD-10-CM

## 2017-01-21 DIAGNOSIS — Z833 Family history of diabetes mellitus: Secondary | ICD-10-CM

## 2017-01-21 DIAGNOSIS — J9621 Acute and chronic respiratory failure with hypoxia: Secondary | ICD-10-CM | POA: Diagnosis present

## 2017-01-21 DIAGNOSIS — Z87891 Personal history of nicotine dependence: Secondary | ICD-10-CM

## 2017-01-21 DIAGNOSIS — E785 Hyperlipidemia, unspecified: Secondary | ICD-10-CM | POA: Diagnosis present

## 2017-01-21 DIAGNOSIS — Z6841 Body Mass Index (BMI) 40.0 and over, adult: Secondary | ICD-10-CM

## 2017-01-21 LAB — BASIC METABOLIC PANEL
Anion gap: 9 (ref 5–15)
BUN: 24 mg/dL — ABNORMAL HIGH (ref 6–20)
CALCIUM: 8.3 mg/dL — AB (ref 8.9–10.3)
CO2: 33 mmol/L — AB (ref 22–32)
CREATININE: 1.56 mg/dL — AB (ref 0.44–1.00)
Chloride: 101 mmol/L (ref 101–111)
GFR, EST AFRICAN AMERICAN: 43 mL/min — AB (ref >60)
GFR, EST NON AFRICAN AMERICAN: 37 mL/min — AB (ref >60)
Glucose, Bld: 129 mg/dL — ABNORMAL HIGH (ref 65–99)
Potassium: 4.7 mmol/L (ref 3.5–5.1)
SODIUM: 143 mmol/L (ref 135–145)

## 2017-01-21 LAB — I-STAT TROPONIN, ED: TROPONIN I, POC: 0 ng/mL (ref 0.00–0.08)

## 2017-01-21 MED ORDER — IPRATROPIUM-ALBUTEROL 0.5-2.5 (3) MG/3ML IN SOLN: 3.0000 mL | Freq: Once | RESPIRATORY_TRACT | Status: DC

## 2017-01-21 MED ORDER — FUROSEMIDE 10 MG/ML IJ SOLN
40.0000 mg | Freq: Once | INTRAMUSCULAR | Status: AC
  Administered 2017-01-22: 40 mg via INTRAVENOUS
  Filled 2017-01-21: qty 4

## 2017-01-21 NOTE — ED Triage Notes (Signed)
Pt complains of being short of breath, a productive cough, chest congestion for two days Pt was seen by her primary and her O2sat was in the 80's, pt placed on 3 liters and sats are 91%

## 2017-01-21 NOTE — ED Notes (Signed)
In xray

## 2017-01-21 NOTE — ED Provider Notes (Signed)
Care assumed from Kara Vincent, [place].  Please see her full H&P.  Kara Vincent is a 51 y.o. female presents with cough, SOB and hypoxia.  Pt is prescribed home O2, but she has not been using it x 1 month because it is broken.  Pt was placed on 3L via [place] and continues to have hypoxia.  Pt reports her legs are swollen and CXR shows some fluid overload.    Physical Exam  BP 117/65 (BP Location: Right Arm)   Pulse 76   Temp 98.6 F (37 C) (Oral)   Resp 18   LMP 03/24/2009   SpO2 91%   Physical Exam  Constitutional: She appears well-developed and well-nourished. No distress.  Obese  HENT:  Head: Normocephalic.  [place] in place at 3Lpm  Eyes: Conjunctivae are normal. No scleral icterus.  Neck: Normal range of motion.  Cardiovascular: Normal rate and intact distal pulses.   Pulmonary/Chest: Effort normal. She has decreased breath sounds. She has rales ( bases).  Musculoskeletal: Normal range of motion.  Neurological: She is alert.  Skin: Skin is warm and dry.  Nursing note and vitals reviewed.  Results for orders placed or performed during the hospital encounter of 17/51/02  Basic metabolic panel  Result Value Ref Range   Sodium 143 135 - 145 mmol/L   Potassium 4.7 3.5 - 5.1 mmol/L   Chloride 101 101 - 111 mmol/L   CO2 33 (H) 22 - 32 mmol/L   Glucose, Bld 129 (H) 65 - 99 mg/dL   BUN 24 (H) 6 - 20 mg/dL   Creatinine, Ser 1.56 (H) 0.44 - 1.00 mg/dL   Calcium 8.3 (L) 8.9 - 10.3 mg/dL   GFR calc non Af Amer 37 (L) >60 mL/min   GFR calc Af Amer 43 (L) >60 mL/min   Anion gap 9 5 - 15  Brain natriuretic peptide  Result Value Ref Range   B Natriuretic Peptide 34.9 0.0 - 100.0 pg/mL  Hepatic function panel  Result Value Ref Range   Total Protein 6.8 6.5 - 8.1 g/dL   Albumin 3.7 3.5 - 5.0 g/dL   AST 18 15 - 41 U/L   ALT 12 (L) 14 - 54 U/L   Alkaline Phosphatase 70 38 - 126 U/L   Total Bilirubin 0.6 0.3 - 1.2 mg/dL   Bilirubin, Direct 0.1 0.1 - 0.5 mg/dL   Indirect Bilirubin 0.5 0.3  - 0.9 mg/dL  Urinalysis, Routine w reflex microscopic  Result Value Ref Range   Color, Urine YELLOW YELLOW   APPearance CLEAR CLEAR   Specific Gravity, Urine 1.013 1.005 - 1.030   pH 5.0 5.0 - 8.0   Glucose, UA NEGATIVE NEGATIVE mg/dL   Hgb urine dipstick NEGATIVE NEGATIVE   Bilirubin Urine NEGATIVE NEGATIVE   Ketones, ur NEGATIVE NEGATIVE mg/dL   Protein, ur NEGATIVE NEGATIVE mg/dL   Nitrite NEGATIVE NEGATIVE   Leukocytes, UA TRACE (A) NEGATIVE   RBC / HPF 0-5 0 - 5 RBC/hpf   WBC, UA 0-5 0 - 5 WBC/hpf   Bacteria, UA NONE SEEN NONE SEEN   Squamous Epithelial / LPF 0-5 (A) NONE SEEN   Mucus PRESENT   I-stat troponin, ED  Result Value Ref Range   Troponin i, poc 0.00 0.00 - 0.08 ng/mL   Comment 3           Dg Chest 2 View  Result Date: 01/21/2017 CLINICAL DATA:  Dyspnea, hypertension. EXAM: CHEST  2 VIEW COMPARISON:  10/01/2016 FINDINGS: Stable cardiomegaly  with central vascular congestion and low lung volumes similar to prior. No pneumonic consolidation, effusion or pneumothorax. No acute nor suspicious osseous abnormality. IMPRESSION: Stable cardiomegaly with central vascular congestion. No significant change from prior. Electronically Signed   By: Ashley Royalty M.D.   On: 01/21/2017 22:22    ED Course  Procedures  Clinical Course as of Jan 23 136  Wed Jan 21, 2017  2300 Plan: Labs pending.  Pt will need admission for persistent hypoxia.    [HM]  Thu Jan 22, 2017  0048 Discussed with Dr. Maudie Mercury who will admit  [HM]    Clinical Course User Index [HM] Glendon Fiser, [name]      MDM Patient presents with shortness of breath, cough and hypoxia.  She has been off of her home oxygen for more than 1 month.  Chest x-ray shows central vascular congestion and patient has diminished breath sounds with mild rales in the bases.  Patient given Lasix.  She will need admission for diuresis and persistent hypoxia.  The patient was discussed with and seen by Dr. Darl Householder who agrees with the  treatment plan.   Hypoxia  SOB (shortness of breath)  Yeast vaginitis    Shataya Winkles, [name] 01/22/17 4715    Drenda Freeze, MD 01/23/17 2300

## 2017-01-21 NOTE — ED Provider Notes (Signed)
[facility] DEPT Provider Note   CSN: [id number] Arrival date & time: 01/21/17  1949     History   Chief Complaint Chief Complaint  Patient presents with  . Shortness of Breath    HPI Kara Vincent is a 51 y.o. female.She presents for SOB. She is a very poor historian.  She is a past medical history of super morbid obesity, CHF, asthma, obstructive sleep apnea.  Her problem list states that she is on 3 L via nasal cannula constantly however the patient states that she is not currently on oxygen at home.   Her symptoms began 3 weeks ago. Patient states that it started with hot and cold chills and became a bad cough productive of thick green phlegm.  She states that it feels like when she had pneumonia about 6 months ago.  She complains of pain in her chest more on the left side.  Patient is also had right leg pain and swelling which seems to be chronic but has worsened.  She denies a history of blood clots in her legs or chest.  The patient was seen by her PCP and sent here today noted to be hypoxic. She is currently on oxygen 3 L on cannula and does not normally require oxygen. Patient is also complaining of "knots on my vagina."  She states that she has discomfort and pain. She used to use ketoconazole , which worked for her. She denies boils or abscesses on her labia., but states that her PCP gave her antibiotics for this complaint.   HPI  Past Medical History:  Diagnosis Date  . Allergy   . Anxiety   . Arthritis   . Asthma   . Asthma   . CHF (congestive heart failure) ([place])   . CHF (congestive heart failure) ([place])   . Chronic abdominal pain   . Chronic kidney disease   . Chronic pain    "all over"  . 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 GERD (gastroesophageal reflux disease)   . Gout   . Hidradenitis   . HLD (hyperlipidemia) 12/14/2015  . Homelessness   . Hyperlipemia   . Hypertension   . Low back pain   . Morbid obesity  ([place])    uses motor wheel chair  . MRSA (methicillin resistant Staphylococcus aureus)    states about a year ago  . On home O2    3L N/C O2 continuously  . OSA (obstructive sleep apnea)    non-compliant with CPAP  . Oxygen deficiency   . Renal insufficiency   . Sleep apnea     Patient Active Problem List   Diagnosis Date Noted  . Acute pulmonary edema ([place]) 12/14/2015  . Essential hypertension 12/14/2015  . HLD (hyperlipidemia) 12/14/2015  . GERD (gastroesophageal reflux disease) 12/14/2015  . Gout 12/14/2015  . Acute on chronic diastolic (congestive) heart failure ([place]) 12/14/2015  . Chest pain 12/14/2015  . Shortness of breath 06/09/2015  . Atypical chest pain   . Darier's disease   . Leukocytosis   . 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 Acute bronchitis 11/20/2014  . Right-sided heart failure ([place]) 11/20/2014  . OSA (obstructive sleep apnea)   . CKD (chronic kidney disease) stage 3, GFR 30-59 ml/min ([facility]) 11/17/2014  . Asthma exacerbation 11/16/2014  . Venous stasis 11/16/2014  . Acute on chronic respiratory failure with hypoxia ([place]) 11/16/2014  . Hypotension 08/08/2012  . Chronic pain 08/24/2011  . Morbid obesity (  [place]) 08/24/2011    Past Surgical History:  Procedure Laterality Date  . BACK SURGERY    . CESAREAN SECTION    . CESAREAN SECTION    . FRACTURE SURGERY    . TUBAL LIGATION      OB History    Gravida Para Term Preterm AB Living   0 0 0 0 0     SAB TAB Ectopic Multiple Live Births   0 0 0           Home Medications    Prior to Admission medications   Medication Sig Start Date End Date Taking? Authorizing Provider  allopurinol (ZYLOPRIM) 100 MG tablet Take 1 tablet (100 mg total) by mouth every morning. 01/22/14  Yes Donne Hazel, MD  ALPRAZolam Duanne Moron) 1 MG tablet Take 1 mg by mouth 3 (three) times daily as needed for anxiety (anxiety).    Yes [provider]  aspirin 325 MG tablet Take 325 mg by mouth every morning.    Yes [provider]  atorvastatin (LIPITOR) 20 MG tablet Take 1 tablet (20 mg total) by mouth every morning. 06/11/15  Yes Lavina Hamman, MD  Emollient (EUCERIN) lotion Apply 5 mLs topically 2 (two) times daily as needed for dry skin. 04/22/14  Yes Linton Flemings, MD  folic acid (FOLVITE) 1 MG tablet Take 1 tablet (1 mg total) by mouth daily. 01/22/14  Yes Donne Hazel, MD  furosemide (LASIX) 20 MG tablet Take 1 tablet (20 mg total) by mouth daily. 12/25/15  Yes Hosie Poisson, MD  HYDROcodone-acetaminophen (NORCO) 10-325 MG tablet Take 1 tablet by mouth every 4 (four) hours as needed for moderate pain. 12/24/15  Yes Hosie Poisson, MD  hydrOXYzine (ATARAX/VISTARIL) 25 MG tablet Take 25 mg by mouth 3 (three) times daily as needed for anxiety or itching (anxiety and itching).    Yes [provider]  indomethacin (INDOCIN SR) 75 MG CR capsule Take 75 mg by mouth 2 (two) times daily with a meal.   Yes [provider]  isosorbide mononitrate (IMDUR) 30 MG 24 hr tablet Take 30 mg by mouth daily. 07/27/15  Yes [provider]  lisinopril (PRINIVIL,ZESTRIL) 40 MG tablet Hold medication for one week, follow up with PCP , check renal function, if its back to baseline, can start taking it. Patient taking differently: Take 40 mg by mouth daily.  12/24/15  Yes Hosie Poisson, MD  Omega-3 Fatty Acids (FISH OIL PO) Take 1 capsule by mouth 2 (two) times daily.   Yes [provider]  solifenacin (VESICARE) 5 MG tablet Take 5 mg by mouth every morning.    Yes [provider]  VITAMIN E PO Take 1 capsule by mouth daily.   Yes [provider]  albuterol (PROVENTIL HFA;VENTOLIN HFA) 108 (90 Base) MCG/ACT inhaler Inhale 1-2 puffs into the lungs every 6 (six) hours as needed for wheezing or shortness of breath.    [provider]  ammonium lactate (AMLACTIN) 12 % cream Apply 1 g topically daily as needed for dry skin. 04/22/14   Linton Flemings, MD  benzonatate (TESSALON) 100 MG  capsule Take 1 capsule (100 mg total) by mouth 3 (three) times daily as needed for cough. Patient not taking: Reported on 10/02/2016 06/11/15   Lavina Hamman, MD  budesonide-formoterol [facility]) 160-4.5 MCG/ACT inhaler Inhale 2 puffs into the lungs 2 (two) times daily. Patient not taking: Reported on 10/02/2016 11/21/14   Debbe Odea, MD  butalbital-acetaminophen-caffeine [place]) [phone]  MG tablet Take 1 tablet by mouth every 6 (six) hours as needed for headache. 06/11/15   Lavina Hamman, MD  dextromethorphan-guaiFENesin [facility] DM) 30-600 MG 12hr tablet Take 1 tablet by mouth 2 (two) times daily. Patient not taking: Reported on 10/02/2016 06/11/15   Lavina Hamman, MD  gabapentin (NEURONTIN) 100 MG capsule Take 1 capsule (100 mg total) by mouth 2 (two) times daily. Patient not taking: Reported on 10/02/2016 06/11/15   Lavina Hamman, MD  ipratropium-albuterol (DUONEB) 0.5-2.5 (3) MG/3ML SOLN Take 3 mLs by nebulization every 6 (six) hours as needed. Patient taking differently: Take 3 mLs by nebulization every 6 (six) hours as needed (SOB).  12/24/15   Hosie Poisson, MD  levofloxacin (LEVAQUIN) 750 MG tablet Take 1 tablet (750 mg total) by mouth daily. X 7 days Patient not taking: Reported on 10/02/2016 07/19/16   Everlene Balls, MD  methocarbamol (ROBAXIN) 500 MG tablet Take 1 tablet (500 mg total) by mouth every 8 (eight) hours as needed for muscle spasms. 06/11/15   Lavina Hamman, MD  pantoprazole (PROTONIX) 40 MG tablet Take 1 tablet (40 mg total) by mouth every morning. Patient not taking: Reported on 01/21/2017 06/11/15   Lavina Hamman, MD  Vitamin D, Ergocalciferol, (DRISDOL) 50000 units CAPS capsule Take 50,000 Units by mouth once a week. 01/12/17   [provider]    Family History Family History  Problem Relation Age of Onset  . Asthma Mother   . Diabetes Father   . Stroke Father   . Heart disease Father   . Arthritis Sister   . Asthma Sister   . Asthma  Daughter   . Asthma Son   . Arthritis Sister   . Asthma Sister     Social History Social History  Substance Use Topics  . Smoking status: Former Smoker    Types: Cigarettes    Quit date: 03/25/2003  . Smokeless tobacco: Former Systems developer    Quit date: 05/27/1978  . Alcohol use 0.0 oz/week     Comment: years ago  no longer beer only  early 20's     Allergies   Ibuprofen; Penicillins; Sulfa antibiotics; Doxycycline; Ibuprofen; Zithromax [azithromycin]; Adhesive [tape]; Adhesive [tape]; Azithromycin; Doxycycline; Penicillins; Sulfa antibiotics; and Sulfamethoxazole-trimethoprim   Review of Systems Review of Systems  Ten systems reviewed and are negative for acute change, except as noted in the HPI.   Physical Exam Updated Vital Signs BP 117/65 (BP Location: Right Arm)   Pulse 76   Temp 98.6 F (37 C) (Oral)   Resp 18   LMP 03/24/2009   SpO2 91%   Physical Exam  Constitutional: She is oriented to person, place, and time. She appears well-developed and well-nourished. No distress.  Morbidly obese female laying back to about 30 degrees. Her exam is limited by body habitus  HENT:  Head: Normocephalic and atraumatic.  Eyes: Conjunctivae are normal. No scleral icterus.  Neck: Normal range of motion.  Cardiovascular: Normal rate, regular rhythm and normal heart sounds.  Exam reveals no gallop and no friction rub.   No murmur heard. BL lower extremity edema, unable to assess different circumference.  Pulmonary/Chest: No respiratory distress. She has wheezes. She has rales.  Distant lung sounds Diffuse wheezes, crackles in the bases  Abdominal: Soft. Bowel sounds are normal. She exhibits no distension and no mass. There is no tenderness. There is no guarding.  Neurological: She is alert and oriented to person, place, and time.  Skin: Skin is warm  and dry. She is not diaphoretic.  Intetriginal yeast vulvitis  Psychiatric: Her behavior is normal.  Nursing note and vitals  reviewed.    ED Treatments / Results  Labs (all labs ordered are listed, but only abnormal results are displayed) Labs Reviewed  BASIC METABOLIC PANEL  BRAIN NATRIURETIC PEPTIDE  I-STAT TROPONIN, ED    EKG  EKG Interpretation None       Radiology No results found.  Procedures Procedures (including critical care time)  Medications Ordered in ED Medications - No data to display   Initial Impression / Assessment and Plan / ED Course  I have reviewed the triage vital signs and the nursing notes.  Pertinent labs & imaging results that were available during my care of the patient were reviewed by me and considered in my medical decision making (see chart for details).  Clinical Course as of Jan 26 717  Wed Jan 21, 2017  2300 Plan: Labs pending.  Pt will need admission for persistent hypoxia.    [HM]  Thu Jan 22, 2017  0048 Discussed with Dr. Maudie Mercury who will admit  [HM]    Clinical Course User Index [HM] [place], Jarrett [place], [place]    Patient given in sign out to PA [place]. Hypoxic, labs, imaging, and work up pending. Plan to admit.  Patient has inguinal intertrigo. She will need topical tx with ketoconazole  Final Clinical Impressions(s) / ED Diagnoses   Final diagnoses:  Hypoxia  SOB (shortness of breath)  Yeast vaginitis  OSA (obstructive sleep apnea)  Acute on chronic respiratory failure with hypoxia [facility])    New Prescriptions New Prescriptions   No medications on file     Margarita Mail, PA-C 01/26/17 8115    Drenda Freeze, MD 01/28/17 2103

## 2017-01-22 ENCOUNTER — Observation Stay (HOSPITAL_COMMUNITY): Payer: Medicaid Other

## 2017-01-22 ENCOUNTER — Encounter (HOSPITAL_COMMUNITY): Payer: Self-pay | Admitting: Internal Medicine

## 2017-01-22 DIAGNOSIS — Z23 Encounter for immunization: Secondary | ICD-10-CM | POA: Diagnosis not present

## 2017-01-22 DIAGNOSIS — M109 Gout, unspecified: Secondary | ICD-10-CM | POA: Diagnosis present

## 2017-01-22 DIAGNOSIS — I130 Hypertensive heart and chronic kidney disease with heart failure and stage 1 through stage 4 chronic kidney disease, or unspecified chronic kidney disease: Secondary | ICD-10-CM | POA: Diagnosis present

## 2017-01-22 DIAGNOSIS — N183 Chronic kidney disease, stage 3 (moderate): Secondary | ICD-10-CM | POA: Diagnosis present

## 2017-01-22 DIAGNOSIS — I5033 Acute on chronic diastolic (congestive) heart failure: Secondary | ICD-10-CM | POA: Diagnosis present

## 2017-01-22 DIAGNOSIS — M79609 Pain in unspecified limb: Secondary | ICD-10-CM | POA: Diagnosis not present

## 2017-01-22 DIAGNOSIS — Z6841 Body Mass Index (BMI) 40.0 and over, adult: Secondary | ICD-10-CM | POA: Diagnosis not present

## 2017-01-22 DIAGNOSIS — Z7982 Long term (current) use of aspirin: Secondary | ICD-10-CM | POA: Diagnosis not present

## 2017-01-22 DIAGNOSIS — Z9981 Dependence on supplemental oxygen: Secondary | ICD-10-CM | POA: Diagnosis not present

## 2017-01-22 DIAGNOSIS — Z881 Allergy status to other antibiotic agents status: Secondary | ICD-10-CM | POA: Diagnosis not present

## 2017-01-22 DIAGNOSIS — Z87891 Personal history of nicotine dependence: Secondary | ICD-10-CM | POA: Diagnosis not present

## 2017-01-22 DIAGNOSIS — Z886 Allergy status to analgesic agent status: Secondary | ICD-10-CM | POA: Diagnosis not present

## 2017-01-22 DIAGNOSIS — Z823 Family history of stroke: Secondary | ICD-10-CM | POA: Diagnosis not present

## 2017-01-22 DIAGNOSIS — J45909 Unspecified asthma, uncomplicated: Secondary | ICD-10-CM | POA: Diagnosis present

## 2017-01-22 DIAGNOSIS — R0602 Shortness of breath: Secondary | ICD-10-CM | POA: Diagnosis not present

## 2017-01-22 DIAGNOSIS — F419 Anxiety disorder, unspecified: Secondary | ICD-10-CM | POA: Diagnosis present

## 2017-01-22 DIAGNOSIS — E785 Hyperlipidemia, unspecified: Secondary | ICD-10-CM | POA: Diagnosis present

## 2017-01-22 DIAGNOSIS — Z882 Allergy status to sulfonamides status: Secondary | ICD-10-CM | POA: Diagnosis not present

## 2017-01-22 DIAGNOSIS — Z8261 Family history of arthritis: Secondary | ICD-10-CM | POA: Diagnosis not present

## 2017-01-22 DIAGNOSIS — Z8249 Family history of ischemic heart disease and other diseases of the circulatory system: Secondary | ICD-10-CM | POA: Diagnosis not present

## 2017-01-22 DIAGNOSIS — E662 Morbid (severe) obesity with alveolar hypoventilation: Secondary | ICD-10-CM | POA: Diagnosis present

## 2017-01-22 DIAGNOSIS — Z833 Family history of diabetes mellitus: Secondary | ICD-10-CM | POA: Diagnosis not present

## 2017-01-22 DIAGNOSIS — R0902 Hypoxemia: Secondary | ICD-10-CM | POA: Diagnosis present

## 2017-01-22 DIAGNOSIS — G4733 Obstructive sleep apnea (adult) (pediatric): Secondary | ICD-10-CM | POA: Diagnosis not present

## 2017-01-22 DIAGNOSIS — Z880 Allergy status to penicillin: Secondary | ICD-10-CM | POA: Diagnosis not present

## 2017-01-22 DIAGNOSIS — Z825 Family history of asthma and other chronic lower respiratory diseases: Secondary | ICD-10-CM | POA: Diagnosis not present

## 2017-01-22 DIAGNOSIS — J9621 Acute and chronic respiratory failure with hypoxia: Secondary | ICD-10-CM | POA: Diagnosis present

## 2017-01-22 DIAGNOSIS — B373 Candidiasis of vulva and vagina: Secondary | ICD-10-CM | POA: Diagnosis present

## 2017-01-22 LAB — CBC
HCT: 46.7 % — ABNORMAL HIGH (ref 36.0–46.0)
Hemoglobin: 13.9 g/dL (ref 12.0–15.0)
MCH: 28.7 pg (ref 26.0–34.0)
MCHC: 29.8 g/dL — AB (ref 30.0–36.0)
MCV: 96.3 fL (ref 78.0–100.0)
PLATELETS: 194 10*3/uL (ref 150–400)
RBC: 4.85 MIL/uL (ref 3.87–5.11)
RDW: 15.2 % (ref 11.5–15.5)
WBC: 7.7 10*3/uL (ref 4.0–10.5)

## 2017-01-22 LAB — COMPREHENSIVE METABOLIC PANEL
ALBUMIN: 3.6 g/dL (ref 3.5–5.0)
ALK PHOS: 67 U/L (ref 38–126)
ALT: 11 U/L — AB (ref 14–54)
AST: 12 U/L — AB (ref 15–41)
Anion gap: 9 (ref 5–15)
BUN: 24 mg/dL — AB (ref 6–20)
CALCIUM: 8.3 mg/dL — AB (ref 8.9–10.3)
CO2: 35 mmol/L — AB (ref 22–32)
CREATININE: 1.52 mg/dL — AB (ref 0.44–1.00)
Chloride: 102 mmol/L (ref 101–111)
GFR calc Af Amer: 44 mL/min — ABNORMAL LOW (ref >60)
GFR calc non Af Amer: 38 mL/min — ABNORMAL LOW (ref >60)
GLUCOSE: 107 mg/dL — AB (ref 65–99)
Potassium: 4.4 mmol/L (ref 3.5–5.1)
SODIUM: 146 mmol/L — AB (ref 135–145)
Total Bilirubin: 0.4 mg/dL (ref 0.3–1.2)
Total Protein: 6.7 g/dL (ref 6.5–8.1)

## 2017-01-22 LAB — HEPATIC FUNCTION PANEL
ALK PHOS: 70 U/L (ref 38–126)
ALT: 12 U/L — ABNORMAL LOW (ref 14–54)
AST: 18 U/L (ref 15–41)
Albumin: 3.7 g/dL (ref 3.5–5.0)
BILIRUBIN DIRECT: 0.1 mg/dL (ref 0.1–0.5)
BILIRUBIN INDIRECT: 0.5 mg/dL (ref 0.3–0.9)
BILIRUBIN TOTAL: 0.6 mg/dL (ref 0.3–1.2)
Total Protein: 6.8 g/dL (ref 6.5–8.1)

## 2017-01-22 LAB — URINALYSIS, ROUTINE W REFLEX MICROSCOPIC
Bacteria, UA: NONE SEEN
Bilirubin Urine: NEGATIVE
GLUCOSE, UA: NEGATIVE mg/dL
Hgb urine dipstick: NEGATIVE
Ketones, ur: NEGATIVE mg/dL
Nitrite: NEGATIVE
PH: 5 (ref 5.0–8.0)
Protein, ur: NEGATIVE mg/dL
Specific Gravity, Urine: 1.013 (ref 1.005–1.030)

## 2017-01-22 LAB — MRSA PCR SCREENING: MRSA by PCR: POSITIVE — AB

## 2017-01-22 LAB — TROPONIN I

## 2017-01-22 LAB — BRAIN NATRIURETIC PEPTIDE: B Natriuretic Peptide: 34.9 pg/mL (ref 0.0–100.0)

## 2017-01-22 LAB — HIV ANTIBODY (ROUTINE TESTING W REFLEX): HIV Screen 4th Generation wRfx: NONREACTIVE

## 2017-01-22 MED ORDER — ALPRAZOLAM 0.5 MG PO TABS
0.5000 mg | ORAL_TABLET | Freq: Three times a day (TID) | ORAL | Status: DC
  Administered 2017-01-22 – 2017-01-23 (×5): 0.5 mg via ORAL
  Filled 2017-01-22 (×5): qty 1

## 2017-01-22 MED ORDER — IPRATROPIUM-ALBUTEROL 0.5-2.5 (3) MG/3ML IN SOLN: 3.0000 mL | Freq: Four times a day (QID) | RESPIRATORY_TRACT | Status: DC | PRN

## 2017-01-22 MED ORDER — DARIFENACIN HYDROBROMIDE ER 7.5 MG PO TB24
7.5000 mg | ORAL_TABLET | Freq: Every day | ORAL | Status: DC
  Administered 2017-01-22 – 2017-01-23 (×2): 7.5 mg via ORAL
  Filled 2017-01-22 (×2): qty 1

## 2017-01-22 MED ORDER — BUTALBITAL-APAP-CAFFEINE 50-325-40 MG PO TABS
1.0000 | ORAL_TABLET | Freq: Four times a day (QID) | ORAL | Status: DC | PRN
  Administered 2017-01-22 – 2017-01-23 (×2): 1 via ORAL
  Filled 2017-01-22 (×3): qty 1

## 2017-01-22 MED ORDER — METHOCARBAMOL 500 MG PO TABS
500.0000 mg | ORAL_TABLET | Freq: Three times a day (TID) | ORAL | Status: DC | PRN
  Administered 2017-01-22: 500 mg via ORAL
  Filled 2017-01-22: qty 1

## 2017-01-22 MED ORDER — VITAMIN D (ERGOCALCIFEROL) 1.25 MG (50000 UNIT) PO CAPS
50000.0000 [IU] | ORAL_CAPSULE | ORAL | Status: DC
  Administered 2017-01-23: 50000 [IU] via ORAL
  Filled 2017-01-22: qty 1

## 2017-01-22 MED ORDER — SODIUM CHLORIDE 0.9% FLUSH
3.0000 mL | Freq: Two times a day (BID) | INTRAVENOUS | Status: DC
Start: 2017-01-22 — End: 2017-01-23
  Administered 2017-01-22 – 2017-01-23 (×3): 3 mL via INTRAVENOUS

## 2017-01-22 MED ORDER — FUROSEMIDE 20 MG PO TABS
20.0000 mg | ORAL_TABLET | Freq: Every day | ORAL | Status: DC
Start: 2017-01-22 — End: 2017-01-22
  Administered 2017-01-22: 20 mg via ORAL
  Filled 2017-01-22: qty 1

## 2017-01-22 MED ORDER — ISOSORBIDE MONONITRATE ER 30 MG PO TB24
30.0000 mg | ORAL_TABLET | Freq: Every day | ORAL | Status: DC
Start: 2017-01-22 — End: 2017-01-23
  Administered 2017-01-22 – 2017-01-23 (×2): 30 mg via ORAL
  Filled 2017-01-22 (×2): qty 1

## 2017-01-22 MED ORDER — ASPIRIN 325 MG PO TABS
325.0000 mg | ORAL_TABLET | Freq: Every morning | ORAL | Status: DC
  Administered 2017-01-22 – 2017-01-23 (×2): 325 mg via ORAL
  Filled 2017-01-22 (×2): qty 1

## 2017-01-22 MED ORDER — HYDROXYZINE HCL 25 MG PO TABS
25.0000 mg | ORAL_TABLET | Freq: Three times a day (TID) | ORAL | Status: DC | PRN
  Administered 2017-01-22: 25 mg via ORAL
  Filled 2017-01-22 (×2): qty 1

## 2017-01-22 MED ORDER — ACETAMINOPHEN 325 MG PO TABS: 650.0000 mg | ORAL_TABLET | Freq: Four times a day (QID) | ORAL | Status: DC | PRN

## 2017-01-22 MED ORDER — ALLOPURINOL 100 MG PO TABS
100.0000 mg | ORAL_TABLET | Freq: Every morning | ORAL | Status: DC
  Administered 2017-01-22 – 2017-01-23 (×2): 100 mg via ORAL
  Filled 2017-01-22 (×2): qty 1

## 2017-01-22 MED ORDER — LEVOFLOXACIN 500 MG PO TABS
500.0000 mg | ORAL_TABLET | Freq: Every day | ORAL | Status: DC
  Administered 2017-01-22 – 2017-01-23 (×2): 500 mg via ORAL
  Filled 2017-01-22 (×3): qty 1

## 2017-01-22 MED ORDER — LUBRIDERM SERIOUSLY SENSITIVE EX LOTN
1.0000 "application " | TOPICAL_LOTION | Freq: Two times a day (BID) | CUTANEOUS | Status: DC | PRN
  Administered 2017-01-22: 1 via TOPICAL
  Filled 2017-01-22: qty 473

## 2017-01-22 MED ORDER — SODIUM CHLORIDE 0.9 % IV SOLN: 250.0000 mL | INTRAVENOUS | Status: DC | PRN

## 2017-01-22 MED ORDER — CHLORHEXIDINE GLUCONATE CLOTH 2 % EX PADS
6.0000 | MEDICATED_PAD | Freq: Every day | CUTANEOUS | Status: DC
  Administered 2017-01-23: 6 via TOPICAL

## 2017-01-22 MED ORDER — PNEUMOCOCCAL VAC POLYVALENT 25 MCG/0.5ML IJ INJ
0.5000 mL | INJECTION | INTRAMUSCULAR | Status: AC
  Administered 2017-01-23: 0.5 mL via INTRAMUSCULAR
  Filled 2017-01-22: qty 0.5

## 2017-01-22 MED ORDER — LISINOPRIL 20 MG PO TABS
40.0000 mg | ORAL_TABLET | Freq: Every day | ORAL | Status: DC
  Administered 2017-01-22 – 2017-01-23 (×2): 40 mg via ORAL
  Filled 2017-01-22 (×2): qty 2

## 2017-01-22 MED ORDER — ATORVASTATIN CALCIUM 20 MG PO TABS
20.0000 mg | ORAL_TABLET | Freq: Every day | ORAL | Status: DC
  Administered 2017-01-22 – 2017-01-23 (×2): 20 mg via ORAL
  Filled 2017-01-22 (×2): qty 1

## 2017-01-22 MED ORDER — ALBUTEROL SULFATE (2.5 MG/3ML) 0.083% IN NEBU: 3.0000 mL | INHALATION_SOLUTION | Freq: Four times a day (QID) | RESPIRATORY_TRACT | Status: DC | PRN

## 2017-01-22 MED ORDER — HYDROCODONE-ACETAMINOPHEN 10-325 MG PO TABS
1.0000 | ORAL_TABLET | ORAL | Status: DC | PRN
  Administered 2017-01-22 – 2017-01-23 (×6): 1 via ORAL
  Filled 2017-01-22 (×6): qty 1

## 2017-01-22 MED ORDER — FOLIC ACID 1 MG PO TABS
1.0000 mg | ORAL_TABLET | Freq: Every day | ORAL | Status: DC
  Administered 2017-01-22 – 2017-01-23 (×2): 1 mg via ORAL
  Filled 2017-01-22 (×2): qty 1

## 2017-01-22 MED ORDER — ENOXAPARIN SODIUM 100 MG/ML ~~LOC~~ SOLN
90.0000 mg | Freq: Every day | SUBCUTANEOUS | Status: DC
  Administered 2017-01-22: 90 mg via SUBCUTANEOUS
  Filled 2017-01-22 (×3): qty 1

## 2017-01-22 MED ORDER — SODIUM CHLORIDE 0.9% FLUSH: 3.0000 mL | INTRAVENOUS | Status: DC | PRN

## 2017-01-22 MED ORDER — ACETAMINOPHEN 650 MG RE SUPP: 650.0000 mg | Freq: Four times a day (QID) | RECTAL | Status: DC | PRN

## 2017-01-22 MED ORDER — MUPIROCIN 2 % EX OINT
1.0000 "application " | TOPICAL_OINTMENT | Freq: Two times a day (BID) | CUTANEOUS | Status: DC
  Administered 2017-01-22 – 2017-01-23 (×2): 1 via NASAL
  Filled 2017-01-22: qty 22

## 2017-01-22 MED ORDER — AMMONIUM LACTATE 12 % EX LOTN
1.0000 "application " | TOPICAL_LOTION | Freq: Every day | CUTANEOUS | Status: DC | PRN
  Administered 2017-01-22: 1 via TOPICAL
  Filled 2017-01-22: qty 225

## 2017-01-22 MED ORDER — FUROSEMIDE 10 MG/ML IJ SOLN
40.0000 mg | Freq: Two times a day (BID) | INTRAMUSCULAR | Status: DC
  Administered 2017-01-22: 40 mg via INTRAVENOUS
  Filled 2017-01-22 (×2): qty 4

## 2017-01-22 NOTE — Progress Notes (Signed)
Back from CT

## 2017-01-22 NOTE — Progress Notes (Signed)
PROGRESS NOTE  Subjective: Kara Vincent is a 51 y.o. female with a history of morbid obesity, OSA not on CPAP, chronic HFpEF, asthma, and chronic migraines who presented to the ED 10/31 with dyspnea, cough and reported leg swelling for 2 weeks. Initial work up showed CXR with cardiomegaly, vascular congestion. IV lasix was given and she was admitted earlier this morning. She reports no improvement since admission   Objective: BP (!) 91/50 (BP Location: Right Arm)   Pulse 65   Temp 98.1 F (36.7 C) (Oral)   Resp 20   Ht 5\' 5"  (1.651 m)   Wt (!) 181.8 kg (400 lb 12.8 oz)   LMP 03/24/2009   SpO2 95%   BMI 66.70 kg/m   Gen: Chronically ill-appearing female Pulm: Clear but distant throughout. No wheezing.   CV: RRR, no murmur. Legs enlarged suspect due to habitus, no pitting noted.  GI: Soft, NT, ND, +BS  Neuro: Alert and oriented. No focal deficits. Skin: No rashes, lesions no ulcers  Assessment & Plan: Hypoxia/dyspnea: Suspect falsely low BNP due to morbid obesity, and will treat as acute on chronic diastolic CHF with underlying OHS and untreated OSA.  - Continue IV lasix BID - Check echo. - Continue levaquin for possible bronchitis started this morning.  - CPAP qHS, will need this as DME at discharge  Headache: Pt reports to me intermittent left arm and leg numbness/tingling for the past week without weakness. Has history of migraines, but this is not typical.  - R/o CVA with CT brain.   Other A/P per H&P from this morning.   Vance Gather, MD [facility] Pager [phone] 01/22/2017, 5:45 PM

## 2017-01-22 NOTE — H&P (Signed)
TRH H&P   Patient Demographics:    Kara Vincent, is a 51 y.o. female  MRN: [id number]   DOB - [birth year]  Admit Date - 01/21/2017  Outpatient Primary MD for the patient is Patient, No Pcp Per  Referring MD/NP/PA:   Shirlyn Goltz,   Outpatient Specialists:   Patient coming from: home  Chief Complaint  Patient presents with  . Shortness of Breath      HPI:    Kara Vincent  is a 51 y.o. female, w morbid obeisity, OSA on cpap, Asthma, CHF (EF 60%), apparently presents w c/o increase in dyspnea for the past 2 weeks. Pt notes slight cough w yellow sputum for the past 4 days.  Pt denies fever, chills, cp, palp, n/v, diarrhea, brbpr.  Pt denies orthopnea, pnd. Pt does note slight weight gain and increase in lower ext edema. Pt presented to ED for evaluation of dyspnea.   In Ed,  Na 143, K 4.7, Bun 24, Creatinnie 1.56 Glucose 129  BNP 34.9,  Trop 0.00  CXR IMPRESSION: Stable cardiomegaly with central vascular congestion. No significant change from prior.  Pt will be admitted for hypoxia, multifactorial but mostly related to morbid obeisity and OSA/ obeisity hypoventillation      Review of systems:    In addition to the HPI above,  No Fever-chills, No Headache, No changes with Vision or hearing, No problems swallowing food or Liquids, No Chest pain No Abdominal pain, No Nausea or Vommitting, Bowel movements are regular, No Blood in stool or Urine, No dysuria, No new skin rashes or bruises, No new joints pains-aches,  No new weakness, tingling, numbness in any extremity, No recent weight gain or loss, No polyuria, polydypsia or polyphagia, No significant Mental Stressors.  A full 10 point Review of Systems was done, except as stated above, all other Review of Systems were negative.   With Past History of the following :    Past Medical History:  Diagnosis Date   . Allergy   . Anxiety   . Arthritis   . Asthma   . Asthma   . CHF (congestive heart failure) ([place])   . CHF (congestive heart failure) ([place])   . Chronic abdominal pain   . Chronic kidney disease   . Chronic pain    "all over"  . 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 GERD (gastroesophageal reflux disease)   . Gout   . Hidradenitis   . HLD (hyperlipidemia) 12/14/2015  . Homelessness   . Hyperlipemia   . Hypertension   . Low back pain   . Morbid obesity ([place])    uses motor wheel chair  . MRSA (methicillin resistant Staphylococcus aureus)    states about a year ago  . On home O2    3L N/C O2 continuously  . OSA (obstructive sleep apnea)    non-compliant  with CPAP  . Oxygen deficiency   . Renal insufficiency   . Sleep apnea       Past Surgical History:  Procedure Laterality Date  . BACK SURGERY    . CESAREAN SECTION    . CESAREAN SECTION    . FRACTURE SURGERY    . TUBAL LIGATION        Social History:     Social History  Substance Use Topics  . Smoking status: Former Smoker    Types: Cigarettes    Quit date: 03/25/2003  . Smokeless tobacco: Former Systems developer    Quit date: 05/27/1978  . Alcohol use 0.0 oz/week     Comment: years ago  no longer beer only  early 20's     Lives - at home  Mobility - unable to walk well   Family History :     Family History  Problem Relation Age of Onset  . Asthma Mother   . Diabetes Father   . Stroke Father   . Heart disease Father   . Arthritis Sister   . Asthma Sister   . Asthma Daughter   . Asthma Son   . Arthritis Sister   . Asthma Sister       Home Medications:   Prior to Admission medications   Medication Sig Start Date End Date Taking? Authorizing Provider  allopurinol (ZYLOPRIM) 100 MG tablet Take 1 tablet (100 mg total) by mouth every morning. 01/22/14  Yes Donne Hazel, MD  ALPRAZolam Duanne Moron) 1 MG tablet Take 1 mg by mouth 3 (three) times daily as needed for anxiety (anxiety).    Yes [provider]  aspirin 325 MG tablet Take 325 mg by mouth every morning.    Yes [provider]  atorvastatin (LIPITOR) 20 MG tablet Take 1 tablet (20 mg total) by mouth every morning. 06/11/15  Yes Lavina Hamman, MD  Emollient (EUCERIN) lotion Apply 5 mLs topically 2 (two) times daily as needed for dry skin. 04/22/14  Yes Linton Flemings, MD  folic acid (FOLVITE) 1 MG tablet Take 1 tablet (1 mg total) by mouth daily. 01/22/14  Yes Donne Hazel, MD  furosemide (LASIX) 20 MG tablet Take 1 tablet (20 mg total) by mouth daily. 12/25/15  Yes Hosie Poisson, MD  HYDROcodone-acetaminophen (NORCO) 10-325 MG tablet Take 1 tablet by mouth every 4 (four) hours as needed for moderate pain. 12/24/15  Yes Hosie Poisson, MD  hydrOXYzine (ATARAX/VISTARIL) 25 MG tablet Take 25 mg by mouth 3 (three) times daily as needed for anxiety or itching (anxiety and itching).    Yes [provider]  indomethacin (INDOCIN SR) 75 MG CR capsule Take 75 mg by mouth 2 (two) times daily with a meal.   Yes [provider]  isosorbide mononitrate (IMDUR) 30 MG 24 hr tablet Take 30 mg by mouth daily. 07/27/15  Yes [provider]  lisinopril (PRINIVIL,ZESTRIL) 40 MG tablet Hold medication for one week, follow up with PCP , check renal function, if its back to baseline, can start taking it. Patient taking differently: Take 40 mg by mouth daily.  12/24/15  Yes Hosie Poisson, MD  Omega-3 Fatty Acids (FISH OIL PO) Take 1 capsule by mouth 2 (two) times daily.   Yes [provider]  solifenacin (VESICARE) 5 MG tablet Take 5 mg by mouth every morning.    Yes [provider]  VITAMIN E PO Take 1 capsule by mouth daily.   Yes [provider]  albuterol (PROVENTIL HFA;VENTOLIN HFA) 108 (90 Base) MCG/ACT inhaler Inhale 1-2 puffs into the lungs every 6 (six) hours as needed for wheezing or shortness of breath.    [provider]  ammonium lactate (AMLACTIN) 12 % cream Apply 1 g  topically daily as needed for dry skin. 04/22/14   Linton Flemings, MD  benzonatate (TESSALON) 100 MG capsule Take 1 capsule (100 mg total) by mouth 3 (three) times daily as needed for cough. Patient not taking: Reported on 10/02/2016 06/11/15   Lavina Hamman, MD  budesonide-formoterol [facility]) 160-4.5 MCG/ACT inhaler Inhale 2 puffs into the lungs 2 (two) times daily. Patient not taking: Reported on 10/02/2016 11/21/14   Debbe Odea, MD  butalbital-acetaminophen-caffeine (FIORICET, ESGIC) [phone] MG tablet Take 1 tablet by mouth every 6 (six) hours as needed for headache. 06/11/15   Lavina Hamman, MD  dextromethorphan-guaiFENesin [facility] DM) 30-600 MG 12hr tablet Take 1 tablet by mouth 2 (two) times daily. Patient not taking: Reported on 10/02/2016 06/11/15   Lavina Hamman, MD  gabapentin (NEURONTIN) 100 MG capsule Take 1 capsule (100 mg total) by mouth 2 (two) times daily. Patient not taking: Reported on 10/02/2016 06/11/15   Lavina Hamman, MD  ipratropium-albuterol (DUONEB) 0.5-2.5 (3) MG/3ML SOLN Take 3 mLs by nebulization every 6 (six) hours as needed. Patient taking differently: Take 3 mLs by nebulization every 6 (six) hours as needed (SOB).  12/24/15   Hosie Poisson, MD  levofloxacin (LEVAQUIN) 750 MG tablet Take 1 tablet (750 mg total) by mouth daily. X 7 days Patient not taking: Reported on 10/02/2016 07/19/16   Everlene Balls, MD  methocarbamol (ROBAXIN) 500 MG tablet Take 1 tablet (500 mg total) by mouth every 8 (eight) hours as needed for muscle spasms. 06/11/15   Lavina Hamman, MD  pantoprazole (PROTONIX) 40 MG tablet Take 1 tablet (40 mg total) by mouth every morning. Patient not taking: Reported on 01/21/2017 06/11/15   Lavina Hamman, MD  Vitamin D, Ergocalciferol, (DRISDOL) 50000 units CAPS capsule Take 50,000 Units by mouth once a week. 01/12/17   [provider]     Allergies:     Allergies  Allergen Reactions  . Ibuprofen Hives, Shortness Of Breath and Palpitations      Has tolerated toradol 12/2013 inpatient  . Penicillins Anaphylaxis and Hives    Has patient had a PCN reaction causing immediate rash, facial/tongue/throat swelling, SOB or lightheadedness with hypotension: Yes Has patient had a PCN reaction causing severe rash involving mucus membranes or skin necrosis: Yes Has patient had a PCN reaction that required hospitalization Unknown Has patient had a PCN reaction occurring within the last 10 years: Unknown If all of the above answers are "NO", then may proceed with Cephalosporin use.   . Sulfa Antibiotics Hives, Shortness Of Breath and Cough  . Doxycycline Hives and Swelling  . Ibuprofen Hives and Swelling  . Zithromax [Azithromycin] Hives and Swelling  . Adhesive [Tape] Rash  . Adhesive [Tape] Rash  . Azithromycin Hives and Rash  . Doxycycline Rash  . Penicillins Rash    Has patient had a PCN reaction causing immediate rash, facial/tongue/throat swelling, SOB or lightheadedness with hypotension:Yes Has patient had a PCN reaction causing severe rash involving mucus membranes or skin necrosis:Yes Has patient had a PCN reaction that required hospitalization Yes Has patient had a PCN reaction occurring within the last 10 years:No If all of the above answers are "NO", then may proceed with Cephalosporin use.   . Sulfa Antibiotics  Rash  . Sulfamethoxazole-Trimethoprim Rash     Physical Exam:   Vitals  Blood pressure 113/69, pulse 71, temperature 98.6 F (37 C), temperature source Oral, resp. rate (!) 21, last menstrual period 03/24/2009, SpO2 97 %.   1. General  lying in bed in NAD,  2. Normal affect and insight, Not Suicidal or Homicidal, Awake Alert, Oriented X 3.  3. No F.N deficits, ALL C.Nerves Intact, Strength 5/5 all 4 extremities, Sensation intact all 4 extremities, Plantars down going.  4. Ears and Eyes appear Normal, Conjunctivae clear, PERRLA. Moist Oral Mucosa.  5. Supple Neck, No JVD, No cervical lymphadenopathy  appriciated, No Carotid Bruits.  6. Symmetrical Chest wall movement, Good air movement bilaterally, CTAB.  7. RRR, No Gallops, Rubs or Murmurs, No Parasternal Heave.  8. Positive Bowel Sounds, Abdomen Soft, No tenderness, No organomegaly appriciated,No rebound -guarding or rigidity.  9.  No Cyanosis, Trace edema,  No Skin Rash or Bruise.  10. Good muscle tone,  joints appear normal , no effusions, Normal ROM.  11. No Palpable Lymph Nodes in Neck or Axillae     Data Review:    CBC No results for input(s): WBC, HGB, HCT, PLT, MCV, MCH, MCHC, RDW, LYMPHSABS, MONOABS, EOSABS, BASOSABS, BANDABS in the last 168 hours.  Invalid input(s): NEUTRABS, BANDSABD ------------------------------------------------------------------------------------------------------------------  Chemistries   Recent Labs Lab 01/21/17 2330  NA 143  K 4.7  CL 101  CO2 33*  GLUCOSE 129*  BUN 24*  CREATININE 1.56*  CALCIUM 8.3*  AST 18  ALT 12*  ALKPHOS 70  BILITOT 0.6   ------------------------------------------------------------------------------------------------------------------ CrCl cannot be calculated (Unknown ideal weight.). ------------------------------------------------------------------------------------------------------------------ No results for input(s): TSH, T4TOTAL, T3FREE, THYROIDAB in the last 72 hours.  Invalid input(s): FREET3  Coagulation profile No results for input(s): INR, PROTIME in the last 168 hours. ------------------------------------------------------------------------------------------------------------------- No results for input(s): DDIMER in the last 72 hours. -------------------------------------------------------------------------------------------------------------------  Cardiac Enzymes No results for input(s): CKMB, TROPONINI, MYOGLOBIN in the last 168 hours.  Invalid input(s):  CK ------------------------------------------------------------------------------------------------------------------    Component Value Date/Time   BNP 34.9 01/21/2017 2330     ---------------------------------------------------------------------------------------------------------------  Urinalysis    Component Value Date/Time   COLORURINE YELLOW 01/21/2017 2330   APPEARANCEUR CLEAR 01/21/2017 2330   LABSPEC 1.013 01/21/2017 2330   PHURINE 5.0 01/21/2017 2330   GLUCOSEU NEGATIVE 01/21/2017 2330   GLUCOSEU NEG mg/dL 10/01/2007 2007   HGBUR NEGATIVE 01/21/2017 2330   HGBUR negative 12/05/2009 1003   BILIRUBINUR NEGATIVE 01/21/2017 2330   BILIRUBINUR small 07/31/2010 1713   KETONESUR NEGATIVE 01/21/2017 2330   PROTEINUR NEGATIVE 01/21/2017 2330   UROBILINOGEN 0.2 12/01/2014 2240   NITRITE NEGATIVE 01/21/2017 2330   LEUKOCYTESUR TRACE (A) 01/21/2017 2330    ----------------------------------------------------------------------------------------------------------------   Imaging Results:    Dg Chest 2 View  Result Date: 01/21/2017 CLINICAL DATA:  Dyspnea, hypertension. EXAM: CHEST  2 VIEW COMPARISON:  10/01/2016 FINDINGS: Stable cardiomegaly with central vascular congestion and low lung volumes similar to prior. No pneumonic consolidation, effusion or pneumothorax. No acute nor suspicious osseous abnormality. IMPRESSION: Stable cardiomegaly with central vascular congestion. No significant change from prior. Electronically Signed   By: Ashley Royalty M.D.   On: 01/21/2017 22:22    nsr at 70, nl axis, nl int, no st-t changes c/w ischemia   Assessment & Plan:    Principal Problem:   Hypoxia Active Problems:   Morbid obesity (HCC)   OSA (obstructive sleep apnea)  Hypoxia/ dyspnea Likely related to obeisity hypoventillation/OSA vs bronchitis Trop I  Check echo levaquin 500mg  po qday cpap    OSA cpap  CHF (EF 60%) Cont lasix Check cardiac echo  Hypertension Cont  lisinopril  Hyperlipidemia Cont lipitor  Gout Cont allopurinol  Anxiety Decrease xanax to 0.5mg  po tid as this can contribute to hypoxia  Asthma Cont duoneb   DVT Prophylaxis Lovenox - SCDs   AM Labs Ordered, also please review Full Orders  Family Communication: Admission, patients condition and plan of care including tests being ordered have been discussed with the patient  who indicate understanding and agree with the plan and Code Status.  Code Status FULL CODE  Likely DC to  home  Condition GUARDED    Consults called: none  Admission status: observation   Time spent in minutes : 45   Jani Gravel M.D on 01/22/2017 at 2:01 AM  Between 7am to 7pm - Pager - [phone]  . After 7pm go to [email or web address] - password [facility]  [facility] - Office  [phone]

## 2017-01-22 NOTE — Care Management Note (Signed)
Case Management Note  Patient Details  Name: Kara Vincent MRN: [id number] Date of Birth: [birth year]  Subjective/Objective: 51 y/o f admitted w/hypoxia. From home.                   Action/Plan:d/c home.   Expected Discharge Date:                  Expected Discharge Plan:  Home/Self Care  In-House Referral:     Discharge planning Services  CM Consult  Post Acute Care Choice:    Choice offered to:     DME Arranged:    DME Agency:     HH Arranged:    HH Agency:     Status of Service:  In process, will continue to follow  If discussed at Long Length of Stay Meetings, dates discussed:    Additional Comments:  Dessa Phi, RN 01/22/2017, 12:38 PM

## 2017-01-22 NOTE — ED Notes (Signed)
Call report to [place], [place]

## 2017-01-22 NOTE — Progress Notes (Signed)
Rx Brief note:  Lovenox  Rx adjusted Lovenox to 90 mg daily in pt with BMI = 66  Thanks Dorrene German 01/22/2017 3:53 AM

## 2017-01-22 NOTE — Progress Notes (Signed)
To CT

## 2017-01-23 ENCOUNTER — Inpatient Hospital Stay (HOSPITAL_COMMUNITY): Payer: Medicaid Other

## 2017-01-23 DIAGNOSIS — M79609 Pain in unspecified limb: Secondary | ICD-10-CM

## 2017-01-23 DIAGNOSIS — R0602 Shortness of breath: Secondary | ICD-10-CM

## 2017-01-23 DIAGNOSIS — J9621 Acute and chronic respiratory failure with hypoxia: Secondary | ICD-10-CM

## 2017-01-23 DIAGNOSIS — G4733 Obstructive sleep apnea (adult) (pediatric): Secondary | ICD-10-CM

## 2017-01-23 DIAGNOSIS — I5033 Acute on chronic diastolic (congestive) heart failure: Secondary | ICD-10-CM

## 2017-01-23 LAB — BASIC METABOLIC PANEL
ANION GAP: 12 (ref 5–15)
BUN: 32 mg/dL — ABNORMAL HIGH (ref 6–20)
CHLORIDE: 91 mmol/L — AB (ref 101–111)
CO2: 36 mmol/L — ABNORMAL HIGH (ref 22–32)
Calcium: 8.5 mg/dL — ABNORMAL LOW (ref 8.9–10.3)
Creatinine, Ser: 1.83 mg/dL — ABNORMAL HIGH (ref 0.44–1.00)
GFR, EST AFRICAN AMERICAN: 36 mL/min — AB (ref >60)
GFR, EST NON AFRICAN AMERICAN: 31 mL/min — AB (ref >60)
Glucose, Bld: 119 mg/dL — ABNORMAL HIGH (ref 65–99)
POTASSIUM: 4.9 mmol/L (ref 3.5–5.1)
SODIUM: 139 mmol/L (ref 135–145)

## 2017-01-23 MED ORDER — GUAIFENESIN-DM 100-10 MG/5ML PO SYRP
5.0000 mL | ORAL_SOLUTION | ORAL | Status: DC | PRN
  Administered 2017-01-23: 5 mL via ORAL
  Filled 2017-01-23: qty 10

## 2017-01-23 MED ORDER — LEVOFLOXACIN 500 MG PO TABS: 500.0000 mg | ORAL_TABLET | Freq: Every day | ORAL | 0 refills | Status: DC

## 2017-01-23 NOTE — Care Management Note (Signed)
Case Management Note  Patient Details  Name: Kara Vincent MRN: [id number] Date of Birth: 06/03/1965  Subjective/Objective: Patient has d/c order. Called medicaid transp [phone]-patient has not had a an medicaid assessment since 2016-left message w/Allen Amedeo Plenty for an assessment prior to arranging for home transp-await call back. [facility] [phone](reservations)-patient is not on their list-would need an initial assessment that takes weeks prior to transp.Patient has an electric w/c, but uses [facility] for home 02 prn, now needing home 02 continuously-has [facility] travel tank in rm. Patient offered to d/c by ambulance [facility] but says she can't leave her w/c,& there is no one to pick it up. TC management to assist w/options for transp home.                 Action/Plan:d/c home   Expected Discharge Date:  01/23/17               Expected Discharge Plan:  Home/Self Care  In-House Referral:  Clinical Social Work  Discharge planning Services  CM Consult  Post Acute Care Choice:  Durable Medical Equipment (electric w/c, [facility]-home 02) Choice offered to:     DME Arranged:    DME Agency:     HH Arranged:    HH Agency:     Status of Service:  Completed, signed off  If discussed at [employer], dates discussed:    Additional Comments:  Dessa Phi, RN 01/23/2017, 1:51 PM

## 2017-01-23 NOTE — Progress Notes (Signed)
Patient states she has a care taker but they will not be available until tomorrow to assist her, & she doesn't have anyone else to help. Informed patient that we can have an ambulance to transport her home,but she would have to find someone to bring her w/c home-she states she will not leave her w/c anywhere it will go with her. She prefers to d/c tomorrow & will travel by [facility] with her w/c, & 02 tomorrow. I have explained that the doctor has d/c her, & she may have to pay for her hospital stay. Patient wants to talk to the doctor.MD notified. I have contacted our management dept-they have offered ambulance transp, & patient to have contact family/friend to pick up her w/c-patient declines CM to contact her care taker. Administration notified.

## 2017-01-23 NOTE — Progress Notes (Addendum)
Right lower extremity venous duplex has been completed. Negative for DVT.  01/23/17 1:49 PM Kara Vincent RVT

## 2017-01-23 NOTE — Care Management Note (Signed)
Case Management Note  Patient Details  Name: ANALYAH MCCONNON MRN: [id number] Date of Birth: [birth year]  Subjective/Objective: Spoke to patient about CPAP(patient states she lost her CPAP)-per [facility] she has an outstanding balance $46.25-they provided her w/ a charity CPAP in 2010-patient never had sleep study-[facility] also has provided her w/home 02. Provided patient w/home 02 travel tank-[facility] has brought into rm. CSW notified of bus pass or transp for home needed. Patient has electric w/c. Asked administration to talk to patient since attending rounded & explained that patient was being d/c today-patient had concerns & wanted to discuss it w/managment.No further CM needs.                                 Action/Plan:d/c home.   Expected Discharge Date:                  Expected Discharge Plan:  Home/Self Care  In-House Referral:  Clinical Social Work  Discharge planning Services  CM Consult  Post Acute Care Choice:  Durable Medical Equipment (electric w/c, [facility]-home 02) Choice offered to:     DME Arranged:    DME Agency:     HH Arranged:    HH Agency:     Status of Service:  Completed, signed off  If discussed at [employer], dates discussed:    Additional Comments:  Dessa Phi, RN 01/23/2017, 12:16 PM

## 2017-01-23 NOTE — Care Management Note (Signed)
Case Management Note  Patient Details  Name: Kara Vincent MRN: [id number] Date of Birth: Jun 10, 1965  Subjective/Objective: patient now has care taker to come to hospital to assist w/d/c today. [place] given to nurse t give patient. She [place] w/electric w/c & home 02 w/asst of care taker. No further CM needs.                   Action/Plan:d/c home w/[facility]HC/dme   Expected Discharge Date:  01/23/17               Expected Discharge Plan:  Home/Self Care  In-House Referral:  Clinical Social Work  Discharge planning Services  CM Consult  Post Acute Care Choice:  Durable Medical Equipment (electric w/c, [facility]-home 02) Choice offered to:  Patient  DME Arranged:  [facility] DME Agency:  [place]:    [place] Agency:     Status of Service:  Completed, signed off  If discussed at [place] of Stay Meetings, dates discussed:    Additional Comments:  Dessa Phi, RN 01/23/2017, 4:32 PM

## 2017-01-23 NOTE — Discharge Summary (Signed)
Physician Discharge Summary  Kara Vincent MLY:[id number] DOB: 09-25-1965 DOA: 01/21/2017  PCP: Patient, No Pcp Per  Admit date: 01/21/2017 Discharge date: 01/23/2017  Admitted From: Home Disposition: Home   Recommendations for Outpatient Follow-up:  1. Follow up with pulmonology as soon as possible. 2. Please obtain BMP/CBC in one week  Home Health: CPAP ordered, but will need referral to pulm for sleep study. Continue home O2. Equipment/Devices: O2 Discharge Condition: Stable CODE STATUS: Full Diet recommendation: Heart healthy  Brief/Interim Summary: Kara Vincent is a 51 y.o. female with a history of morbid obesity, OSA not on CPAP, chronic HFpEF, asthma, and chronic migraines who presented to the ED 10/31 with dyspnea, cough and reported leg swelling for 2 weeks. Initial work up showed CXR with cardiomegaly, vascular congestion without infiltrates. BNP was normal and her weight was below prior readings, though IV lasix BID was started and she was admitted due to hypoxia. Levaquin was also given empirically for bacterial bronchitis. Overnight creatinine bumped and she had significant urine output. Her respiratory effort has normalized and she is stable on her home oxygen level.  Discharge Diagnoses:  Principal Problem:   Hypoxia Active Problems:   Morbid obesity (HCC)   OSA (obstructive sleep apnea)   Acute on chronic diastolic (congestive) heart failure (HCC)  Acute on chronic hypoxic respiratory failure: Resolved. Multifactorial primarily related to OHS/untreated OSA (lost her CPAP months ago), and possibly asthma. Suspect acutely due to mild volume overload treated with IV lasix and possibly bronchitis. Respiratory effort has normalized and exam is improved and oxygen requirement has returned to baseline. Suspect falsely low BNP due to morbid obesity, and will treat as acute on chronic diastolic CHF with underlying OHS and untreated OSA.  - Continue home dose lasix and  recheck BMP at follow up. Creatinine bumped 1.56 (near baseline) > 1.83 with BUN of 32 and contraction alkalosis (bicarb 36) indicating she is intravascularly dry. Exam without pitting edema or crackles (pt reports leg swelling but this may be due to habitus).  - Continue levaquin for possible bronchitis  - CPAP qHS ordered, but will need repeat sleep study. Ambulatory referral to pulmonology for this.  - Continue oxygen at baseline at discharge.   Headache: Pt reports to me intermittent left arm and leg numbness/tingling for the past week without weakness. Has history of migraines, which is the suspected etiology of these headaches after nonfocal neuro exam, normal CT head.  - Tylenol prn and home fioricet  Leg swelling and tenderness: No significant exam findings. R > L.  - RLE U/S negative for DVT. Recommend supportive care.   Other chronic medical conditions were treated with home medications and remained stable.   Discharge Instructions Discharge Instructions    (HEART FAILURE PATIENTS) Call MD:  Anytime you have any of the following symptoms: 1) 3 pound weight gain in 24 hours or 5 pounds in 1 week 2) shortness of breath, with or without a dry hacking cough 3) swelling in the hands, feet or stomach 4) if you have to sleep on extra pillows at night in order to breathe.    Complete by:  As directed    Ambulatory referral to Pulmonology    Complete by:  As directed    for sleep study, OHS/OSA.   Diet - low sodium heart healthy    Complete by:  As directed    Discharge instructions    Complete by:  As directed    You were admitted for difficulty breathing  due to bronchitis and mild volume overload. Antibiotics and increased lasix were given with good response and you are stable for discharge with the following recommendations:  - Continue taking levaquin for 5 days after discharge - Continue taking lasix and other medications as you were until you follow up.  - You will need to follow  up with pulmonology for a sleep study to qualify for CPAP at night, and to optimize your medications for breathing. This is important, and a referral was placed at discharge.  - Your symptoms will slowly improve over time. If they get WORSE, seek medical attention right away.   Increase activity slowly    Complete by:  As directed      Allergies as of 01/23/2017      Reactions   Ibuprofen Hives, Shortness Of Breath, Palpitations   Has tolerated toradol 12/2013 inpatient   Penicillins Anaphylaxis, Hives   Has patient had a PCN reaction causing immediate rash, facial/tongue/throat swelling, SOB or lightheadedness with hypotension: Yes Has patient had a PCN reaction causing severe rash involving mucus membranes or skin necrosis: Yes Has patient had a PCN reaction that required hospitalization Unknown Has patient had a PCN reaction occurring within the last 10 years: Unknown If all of the above answers are "NO", then may proceed with Cephalosporin use.   Sulfa Antibiotics Hives, Shortness Of Breath, Cough   Doxycycline Hives, Swelling   Ibuprofen Hives, Swelling   Zithromax [azithromycin] Hives, Swelling   Adhesive [tape] Rash   Adhesive [tape] Rash   Azithromycin Hives, Rash   Doxycycline Rash   Penicillins Rash   Has patient had a PCN reaction causing immediate rash, facial/tongue/throat swelling, SOB or lightheadedness with hypotension:Yes Has patient had a PCN reaction causing severe rash involving mucus membranes or skin necrosis:Yes Has patient had a PCN reaction that required hospitalization Yes Has patient had a PCN reaction occurring within the last 10 years:No If all of the above answers are "NO", then may proceed with Cephalosporin use.   Sulfa Antibiotics Rash   Sulfamethoxazole-trimethoprim Rash      Medication List    TAKE these medications   albuterol 108 (90 Base) MCG/ACT inhaler Commonly known as:  PROVENTIL HFA;VENTOLIN HFA Inhale 1-2 puffs into the lungs every 6  (six) hours as needed for wheezing or shortness of breath.   allopurinol 100 MG tablet Commonly known as:  ZYLOPRIM Take 1 tablet (100 mg total) by mouth every morning.   ALPRAZolam 1 MG tablet Commonly known as:  XANAX Take 1 mg by mouth 3 (three) times daily as needed for anxiety (anxiety).   ammonium lactate 12 % cream Commonly known as:  AMLACTIN Apply 1 g topically daily as needed for dry skin.   aspirin 325 MG tablet Take 325 mg by mouth every morning.   atorvastatin 20 MG tablet Commonly known as:  LIPITOR Take 1 tablet (20 mg total) by mouth every morning.   benzonatate 100 MG capsule Commonly known as:  TESSALON Take 1 capsule (100 mg total) by mouth 3 (three) times daily as needed for cough.   budesonide-formoterol 160-4.5 MCG/ACT inhaler Commonly known as:  SYMBICORT Inhale 2 puffs into the lungs 2 (two) times daily.   butalbital-acetaminophen-caffeine 50-325-40 MG tablet Commonly known as:  FIORICET, ESGIC Take 1 tablet by mouth every 6 (six) hours as needed for headache.   dextromethorphan-guaiFENesin 30-600 MG 12hr tablet Commonly known as:  MUCINEX DM Take 1 tablet by mouth 2 (two) times daily.   eucerin lotion  Apply 5 mLs topically 2 (two) times daily as needed for dry skin.   FISH OIL PO Take 1 capsule by mouth 2 (two) times daily.   folic acid 1 MG tablet Commonly known as:  FOLVITE Take 1 tablet (1 mg total) by mouth daily.   furosemide 20 MG tablet Commonly known as:  LASIX Take 1 tablet (20 mg total) by mouth daily.   gabapentin 100 MG capsule Commonly known as:  NEURONTIN Take 1 capsule (100 mg total) by mouth 2 (two) times daily.   HYDROcodone-acetaminophen 10-325 MG tablet Commonly known as:  NORCO Take 1 tablet by mouth every 4 (four) hours as needed for moderate pain.   hydrOXYzine 25 MG tablet Commonly known as:  ATARAX/VISTARIL Take 25 mg by mouth 3 (three) times daily as needed for anxiety or itching (anxiety and itching).    indomethacin 75 MG CR capsule Commonly known as:  INDOCIN SR Take 75 mg by mouth 2 (two) times daily with a meal.   ipratropium-albuterol 0.5-2.5 (3) MG/3ML Soln Commonly known as:  DUONEB Take 3 mLs by nebulization every 6 (six) hours as needed. What changed:  reasons to take this   isosorbide mononitrate 30 MG 24 hr tablet Commonly known as:  IMDUR Take 30 mg by mouth daily.   levofloxacin 500 MG tablet Commonly known as:  LEVAQUIN Take 1 tablet (500 mg total) by mouth daily. X 7 days What changed:  medication strength  how much to take   lisinopril 40 MG tablet Commonly known as:  PRINIVIL,ZESTRIL Hold medication for one week, follow up with PCP , check renal function, if its back to baseline, can start taking it. What changed:  how much to take  how to take this  when to take this  additional instructions   methocarbamol 500 MG tablet Commonly known as:  ROBAXIN Take 1 tablet (500 mg total) by mouth every 8 (eight) hours as needed for muscle spasms.   pantoprazole 40 MG tablet Commonly known as:  PROTONIX Take 1 tablet (40 mg total) by mouth every morning.   solifenacin 5 MG tablet Commonly known as:  VESICARE Take 5 mg by mouth every morning.   Vitamin D (Ergocalciferol) 50000 units Caps capsule Commonly known as:  DRISDOL Take 50,000 Units by mouth once a week.   VITAMIN E PO Take 1 capsule by mouth daily.            Durable Medical Equipment        Start     Ordered   01/23/17 1217  DME Oxygen  Once    Question Answer Comment  Mode or (Route) Nasal cannula   Frequency Continuous (stationary and portable oxygen unit needed)   Oxygen delivery system Gas      01/23/17 1219     Follow-up Information    [facility] Follow up.          Allergies  Allergen Reactions  . Ibuprofen Hives, Shortness Of Breath and Palpitations    Has tolerated toradol 12/2013 inpatient  . Penicillins Anaphylaxis and Hives    Has patient had a PCN  reaction causing immediate rash, facial/tongue/throat swelling, SOB or lightheadedness with hypotension: Yes Has patient had a PCN reaction causing severe rash involving mucus membranes or skin necrosis: Yes Has patient had a PCN reaction that required hospitalization Unknown Has patient had a PCN reaction occurring within the last 10 years: Unknown If all of the above answers are "NO", then may proceed with Cephalosporin  use.   . Sulfa Antibiotics Hives, Shortness Of Breath and Cough  . Doxycycline Hives and Swelling  . Ibuprofen Hives and Swelling  . Zithromax [Azithromycin] Hives and Swelling  . Adhesive [Tape] Rash  . Adhesive [Tape] Rash  . Azithromycin Hives and Rash  . Doxycycline Rash  . Penicillins Rash    Has patient had a PCN reaction causing immediate rash, facial/tongue/throat swelling, SOB or lightheadedness with hypotension:Yes Has patient had a PCN reaction causing severe rash involving mucus membranes or skin necrosis:Yes Has patient had a PCN reaction that required hospitalization Yes Has patient had a PCN reaction occurring within the last 10 years:No If all of the above answers are "NO", then may proceed with Cephalosporin use.   . Sulfa Antibiotics Rash  . Sulfamethoxazole-Trimethoprim Rash    Consultations:  None  Procedures/Studies: Dg Chest 2 View  Result Date: 01/21/2017 CLINICAL DATA:  Dyspnea, hypertension. EXAM: CHEST  2 VIEW COMPARISON:  10/01/2016 FINDINGS: Stable cardiomegaly with central vascular congestion and low lung volumes similar to prior. No pneumonic consolidation, effusion or pneumothorax. No acute nor suspicious osseous abnormality. IMPRESSION: Stable cardiomegaly with central vascular congestion. No significant change from prior. Electronically Signed   By: Ashley Royalty M.D.   On: 01/21/2017 22:22   Ct Head Wo Contrast  Result Date: 01/22/2017 CLINICAL DATA:  Chronic headache, intermittent left-sided numbness, morbid obesity EXAM: CT HEAD  WITHOUT CONTRAST TECHNIQUE: Contiguous axial images were obtained from the base of the skull through the vertex without intravenous contrast. COMPARISON:  CT brain scan of 11/26/2015 FINDINGS: Brain: The ventricular system is stable in size and configuration and the septum is in a midline position. The fourth ventricle and basilar cisterns are unremarkable. No hemorrhage, mass lesion, or acute infarction is seen. Vascular: No vascular abnormality is noted on this unenhanced study. Skull: On bone window images, no calvarial abnormality is noted. Sinuses/Orbits: The paranasal sinuses are well pneumatized. Other: IMPRESSION: Negative unenhanced CT of the brain. Electronically Signed   By: Ivar Drape M.D.   On: 01/22/2017 12:58   Subjective: Feels unwell this morning, breathing is stable on her home oxygen. She denies wheezing, still reports swelling in her legs despite significant urination over yesterday. Worried about her kidneys when I tell her they showed evidence that we can't take any more fluid off. When discussing discharge today she becomes agitated, stating her right leg is still swollen and painful "sinceI got in here." No fevers. No events noted overnight by pt or RN.  Discharge Exam: Vitals:   01/22/17 2328 01/23/17 0620  BP:  (!) 134/55  Pulse: 68 70  Resp: 18 16  Temp:  97.8 F (36.6 C)  SpO2: 96% 92%   Body mass index is 66.96 kg/m.   General: Unkempt female in no distress, sleeping quietly when entering room.  Cardiovascular: Regular, no murmur, no JVD.  Respiratory: Nonlabored, clear throughout.  Abdominal: Soft, NT, ND, bowel sounds + Extremities: Legs enlarged suspect due to habitus with fatty tissue diffusely, no pitting noted. Socks leave no indentation. Negative homan's.   Labs: BNP (last 3 results)  Recent Labs  01/21/17 2330  BNP 66.2   Basic Metabolic Panel:  Recent Labs Lab 01/21/17 2330 01/22/17 0452 01/23/17 0706  NA 143 146* 139  K 4.7 4.4 4.9  CL  101 102 91*  CO2 33* 35* 36*  GLUCOSE 129* 107* 119*  BUN 24* 24* 32*  CREATININE 1.56* 1.52* 1.83*  CALCIUM 8.3* 8.3* 8.5*   Liver Function  Tests:  Recent Labs Lab 01/21/17 2330 01/22/17 0452  AST 18 12*  ALT 12* 11*  ALKPHOS 70 67  BILITOT 0.6 0.4  PROT 6.8 6.7  ALBUMIN 3.7 3.6   No results for input(s): LIPASE, AMYLASE in the last 168 hours. No results for input(s): AMMONIA in the last 168 hours. CBC:  Recent Labs Lab 01/22/17 0452  WBC 7.7  HGB 13.9  HCT 46.7*  MCV 96.3  PLT 194   Cardiac Enzymes:  Recent Labs Lab 01/22/17 0452  TROPONINI <0.03   BNP: Invalid input(s): POCBNP CBG: No results for input(s): GLUCAP in the last 168 hours. D-Dimer No results for input(s): DDIMER in the last 72 hours. Hgb A1c No results for input(s): HGBA1C in the last 72 hours. Lipid Profile No results for input(s): CHOL, HDL, LDLCALC, TRIG, CHOLHDL, LDLDIRECT in the last 72 hours. Thyroid function studies No results for input(s): TSH, T4TOTAL, T3FREE, THYROIDAB in the last 72 hours.  Invalid input(s): FREET3 Anemia work up No results for input(s): VITAMINB12, FOLATE, FERRITIN, TIBC, IRON, RETICCTPCT in the last 72 hours. Urinalysis    Component Value Date/Time   COLORURINE YELLOW 01/21/2017 2330   APPEARANCEUR CLEAR 01/21/2017 2330   LABSPEC 1.013 01/21/2017 2330   PHURINE 5.0 01/21/2017 2330   GLUCOSEU NEGATIVE 01/21/2017 2330   GLUCOSEU NEG mg/dL 10/01/2007 2007   HGBUR NEGATIVE 01/21/2017 2330   HGBUR negative 12/05/2009 1003   BILIRUBINUR NEGATIVE 01/21/2017 2330   BILIRUBINUR small 07/31/2010 1713   KETONESUR NEGATIVE 01/21/2017 2330   PROTEINUR NEGATIVE 01/21/2017 2330   UROBILINOGEN 0.2 12/01/2014 2240   NITRITE NEGATIVE 01/21/2017 2330   LEUKOCYTESUR TRACE (A) 01/21/2017 2330    Microbiology Recent Results (from the past 240 hour(s))  MRSA PCR Screening     Status: Abnormal   Collection Time: 01/22/17  3:47 AM  Result Value Ref Range Status    MRSA by PCR POSITIVE (A) NEGATIVE Final    Comment:        The GeneXpert MRSA Assay (FDA approved for NASAL specimens only), is one component of a comprehensive MRSA colonization surveillance program. It is not intended to diagnose MRSA infection nor to guide or monitor treatment for MRSA infections. RESULT CALLED TO, READ BACK BY AND VERIFIED WITH: A.POWERS AT 1506 ON 01/22/17 BY N.[email or web address]     Time coordinating discharge: Approximately 40 minutes  Vance Gather, MD  [facility] 01/23/2017, 12:20 PM Pager [phone]

## 2017-01-24 IMAGING — CR DG CHEST 1V PORT
1 series · 1 of 1 positions shown · non-contrast
Comparison: 06/13/2014

CLINICAL DATA: Chest pain

EXAM:
PORTABLE CHEST - 1 VIEW

[AP]
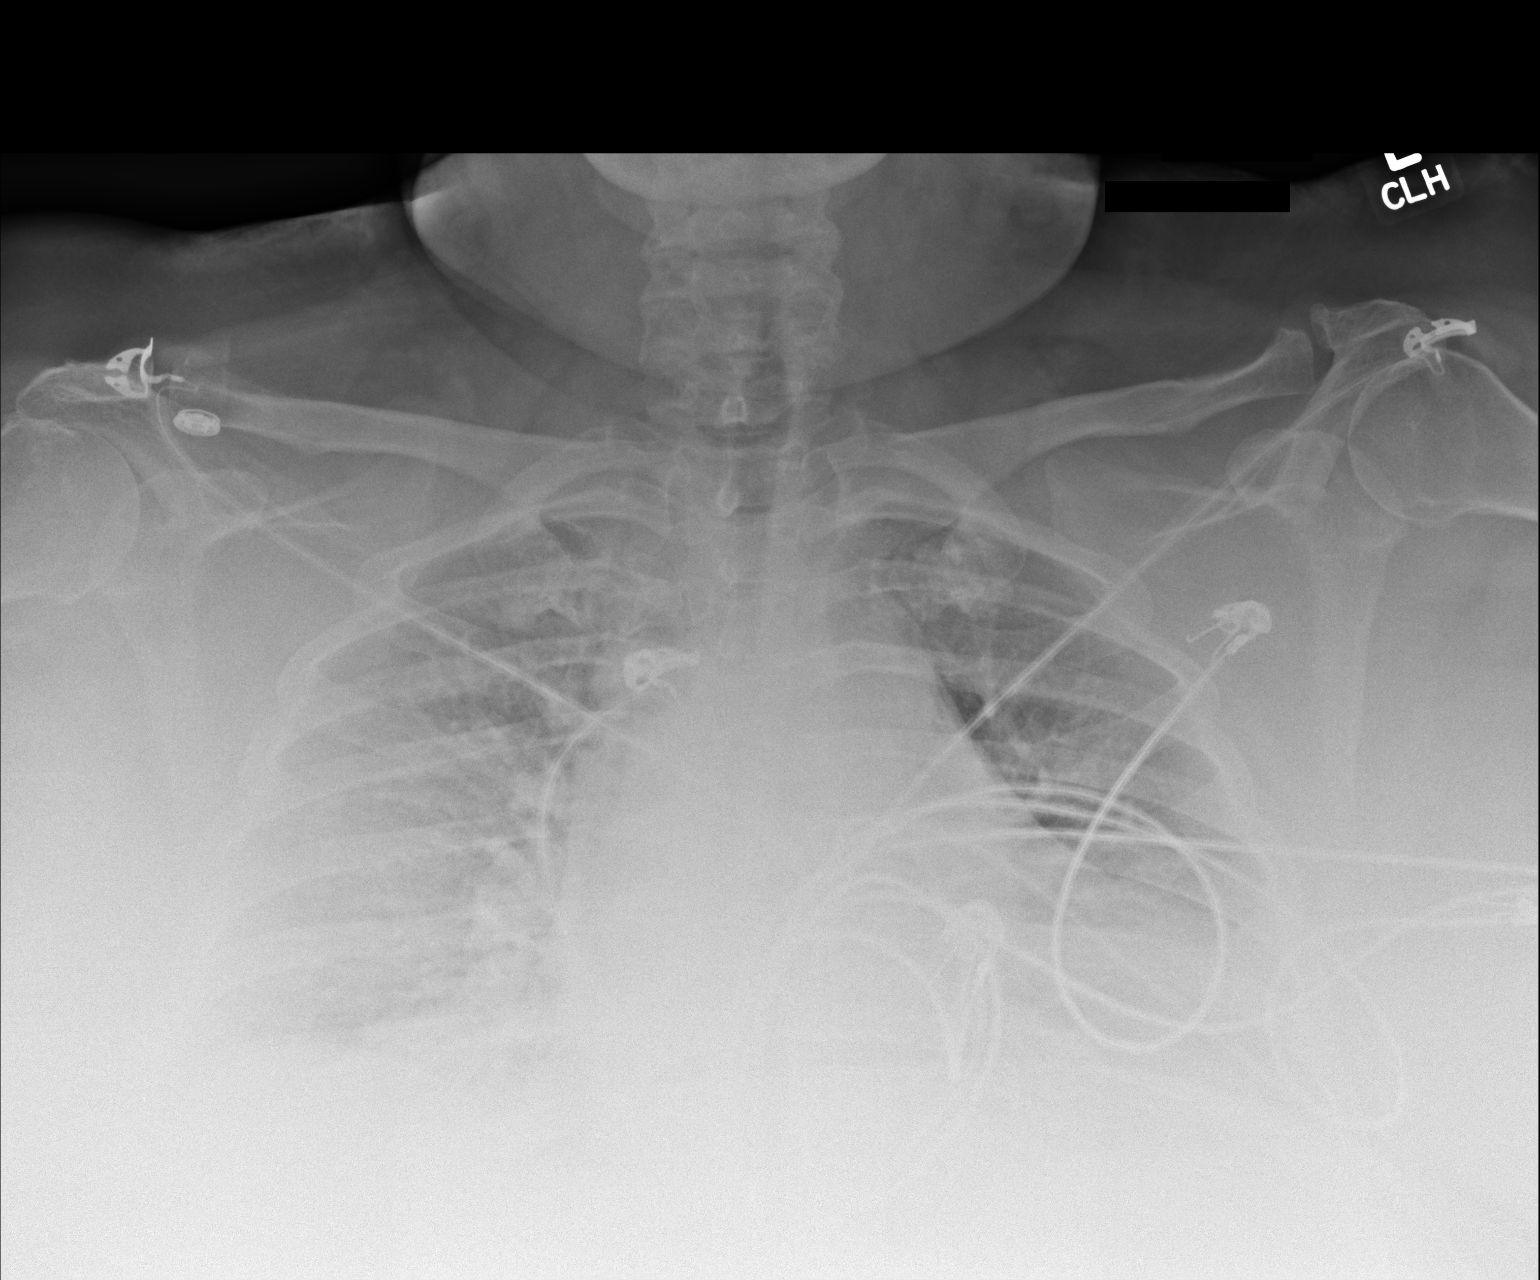

[1 of 1 positions shown; findings below may reference images not displayed]

FINDINGS: Limited study due to patient size and hypoventilation. There is no
gross edema or pneumonia, with right infrahilar density chronic and
likely from fat pad. Chronic cardiomegaly and upper mediastinal
widening.
IMPRESSION: No acute findings on this limited portable chest.

## 2017-02-24 ENCOUNTER — Other Ambulatory Visit: Payer: Self-pay

## 2017-02-24 ENCOUNTER — Encounter (HOSPITAL_COMMUNITY): Payer: Self-pay | Admitting: Emergency Medicine

## 2017-02-24 DIAGNOSIS — L02214 Cutaneous abscess of groin: Secondary | ICD-10-CM | POA: Insufficient documentation

## 2017-02-24 DIAGNOSIS — R062 Wheezing: Secondary | ICD-10-CM | POA: Diagnosis present

## 2017-02-24 DIAGNOSIS — N3001 Acute cystitis with hematuria: Secondary | ICD-10-CM | POA: Diagnosis not present

## 2017-02-24 NOTE — ED Triage Notes (Signed)
Pt states she is having problems with her asthma and her oxygen is running low  Pt states she has knots in her perineal area and in her vagina that are bleeding and causing her pain   Pt states her sxs started about 3 to 4 days ago   Pt's blood pressure low in traige

## 2017-02-25 ENCOUNTER — Emergency Department (HOSPITAL_COMMUNITY)
Admission: EM | Admit: 2017-02-25 | Discharge: 2017-02-25 | Disposition: A | Discharge status: Home / Self Care | Payer: Medicaid Other | Attending: Emergency Medicine | Admitting: Emergency Medicine

## 2017-02-25 DIAGNOSIS — N3001 Acute cystitis with hematuria: Secondary | ICD-10-CM

## 2017-02-25 LAB — URINALYSIS, ROUTINE W REFLEX MICROSCOPIC
Bilirubin Urine: NEGATIVE
GLUCOSE, UA: NEGATIVE mg/dL
KETONES UR: NEGATIVE mg/dL
NITRITE: NEGATIVE
Protein, ur: NEGATIVE mg/dL
SPECIFIC GRAVITY, URINE: 1.019 (ref 1.005–1.030)
pH: 5 (ref 5.0–8.0)

## 2017-02-25 LAB — GC/CHLAMYDIA PROBE AMP (~~LOC~~) NOT AT ARMC
Chlamydia: NEGATIVE
NEISSERIA GONORRHEA: NEGATIVE

## 2017-02-25 LAB — WET PREP, GENITAL
CLUE CELLS WET PREP: NONE SEEN
SPERM: NONE SEEN
Trich, Wet Prep: NONE SEEN
Yeast Wet Prep HPF POC: NONE SEEN

## 2017-02-25 MED ORDER — CLINDAMYCIN HCL 150 MG PO CAPS: 450.0000 mg | ORAL_CAPSULE | Freq: Three times a day (TID) | ORAL | 0 refills | Status: AC

## 2017-02-25 MED ORDER — NITROFURANTOIN MONOHYD MACRO 100 MG PO CAPS: 100.0000 mg | ORAL_CAPSULE | Freq: Two times a day (BID) | ORAL | 0 refills | Status: DC

## 2017-02-25 NOTE — ED Provider Notes (Signed)
[facility] DEPT Provider Note   CSN: [id number] Arrival date & time: 02/24/17  2059     History   Chief Complaint Chief Complaint  Patient presents with  . Shortness of Breath  . Vaginal Pain  . Hypotension    HPI Kara Vincent is a 51 y.o. female with history of morbid obesity, sleep apnea, asthma, chronic diastolic heart failure presents to the ED for evaluation of recurrent, persistent boil to left inner thigh for several months. Associated symptoms include local tenderness, redness and bloody discharge from boil. States drainage from boil is sometimes yellow. Boil does not seem to be healing, sometimes hurts more and sometimes it doesn't hurt. States sometimes it seems like she is bleeding from her vagina like a period however LMP was many years ago. She is unsure about abnormal or increased vaginal discharge, states she can't usually see her genital area or the discharge coming from it due to obesity. States she has been evaluated for this in the past and boils continue to reoccur. She denies fevers, chills, dysuria.  HPI  Past Medical History:  Diagnosis Date  . Allergy   . Anxiety   . Arthritis   . Asthma   . Asthma   . CHF (congestive heart failure) ([place])   . CHF (congestive heart failure) ([place])   . Chronic abdominal pain   . Chronic kidney disease   . Chronic pain    "all over"  . 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 GERD (gastroesophageal reflux disease)   . Gout   . Hidradenitis   . HLD (hyperlipidemia) 12/14/2015  . Homelessness   . Hyperlipemia   . Hypertension   . Low back pain   . Morbid obesity ([place])    uses motor wheel chair  . MRSA (methicillin resistant Staphylococcus aureus)    states about a year ago  . On home O2    3L N/C O2 continuously  . OSA (obstructive sleep apnea)    non-compliant with CPAP  . Oxygen deficiency   . Renal insufficiency   . Sleep apnea     Patient Active Problem List     Diagnosis Date Noted  . Hypoxia 01/22/2017  . Acute pulmonary edema ([place]) 12/14/2015  . Essential hypertension 12/14/2015  . HLD (hyperlipidemia) 12/14/2015  . GERD (gastroesophageal reflux disease) 12/14/2015  . Gout 12/14/2015  . Acute on chronic diastolic (congestive) heart failure ([place]) 12/14/2015  . Chest pain 12/14/2015  . Shortness of breath 06/09/2015  . Atypical chest pain   . Darier's disease   . Leukocytosis   . 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 Acute bronchitis 11/20/2014  . Right-sided heart failure ([place]) 11/20/2014  . OSA (obstructive sleep apnea)   . CKD (chronic kidney disease) stage 3, GFR 30-59 ml/min (H[facility]) 11/17/2014  . Asthma exacerbation 11/16/2014  . Venous stasis 11/16/2014  . Acute on chronic respiratory failure with hypoxia ([place]) 11/16/2014  . Hypotension 08/08/2012  . Chronic pain 08/24/2011  . Morbid obesity ([place]) 08/24/2011    Past Surgical History:  Procedure Laterality Date  . BACK SURGERY    . CESAREAN SECTION    . CESAREAN SECTION    . FRACTURE SURGERY    . TUBAL LIGATION      OB History    Gravida Para Term Preterm AB Living   0 0 0 0 0     SAB TAB Ectopic Multiple Live Births   0 0 0  Home Medications    Prior to Admission medications   Medication Sig Start Date End Date Taking? Authorizing Provider  albuterol (PROVENTIL HFA;VENTOLIN HFA) 108 (90 Base) MCG/ACT inhaler Inhale 1-2 puffs into the lungs every 6 (six) hours as needed for wheezing or shortness of breath.    [provider]  allopurinol (ZYLOPRIM) 100 MG tablet Take 1 tablet (100 mg total) by mouth every morning. 01/22/14   Donne Hazel, MD  ALPRAZolam Duanne Moron) 1 MG tablet Take 1 mg by mouth 3 (three) times daily as needed for anxiety (anxiety).     [provider]  ammonium lactate (AMLACTIN) 12 % cream Apply 1 g topically daily as needed for dry skin. 04/22/14   Linton Flemings, MD  aspirin 325 MG tablet Take 325 mg by mouth every morning.      [provider]  atorvastatin (LIPITOR) 20 MG tablet Take 1 tablet (20 mg total) by mouth every morning. 06/11/15   Lavina Hamman, MD  benzonatate (TESSALON) 100 MG capsule Take 1 capsule (100 mg total) by mouth 3 (three) times daily as needed for cough. Patient not taking: Reported on 10/02/2016 06/11/15   Lavina Hamman, MD  budesonide-formoterol [facility]) 160-4.5 MCG/ACT inhaler Inhale 2 puffs into the lungs 2 (two) times daily. Patient not taking: Reported on 10/02/2016 11/21/14   Debbe Odea, MD  butalbital-acetaminophen-caffeine (FIORICET, ESGIC) [id number] MG tablet Take 1 tablet by mouth every 6 (six) hours as needed for headache. 06/11/15   Lavina Hamman, MD  clindamycin (CLEOCIN) 150 MG capsule Take 3 capsules (450 mg total) by mouth 3 (three) times daily for 7 days. 02/25/17 03/04/17  Kinnie Feil, PA-C  dextromethorphan-guaiFENesin (MUCINEX DM) 30-600 MG 12hr tablet Take 1 tablet by mouth 2 (two) times daily. Patient not taking: Reported on 10/02/2016 06/11/15   Lavina Hamman, MD  Emollient (EUCERIN) lotion Apply 5 mLs topically 2 (two) times daily as needed for dry skin. 04/22/14   Linton Flemings, MD  folic acid (FOLVITE) 1 MG tablet Take 1 tablet (1 mg total) by mouth daily. 01/22/14   Donne Hazel, MD  furosemide (LASIX) 20 MG tablet Take 1 tablet (20 mg total) by mouth daily. 12/25/15   Hosie Poisson, MD  gabapentin (NEURONTIN) 100 MG capsule Take 1 capsule (100 mg total) by mouth 2 (two) times daily. Patient not taking: Reported on 10/02/2016 06/11/15   Lavina Hamman, MD  HYDROcodone-acetaminophen [facility]) 10-325 MG tablet Take 1 tablet by mouth every 4 (four) hours as needed for moderate pain. 12/24/15   Hosie Poisson, MD  hydrOXYzine (ATARAX/VISTARIL) 25 MG tablet Take 25 mg by mouth 3 (three) times daily as needed for anxiety or itching (anxiety and itching).     [provider]  indomethacin (INDOCIN SR) 75 MG CR capsule Take 75 mg by mouth 2 (two) times  daily with a meal.    [provider]  ipratropium-albuterol (DUONEB) 0.5-2.5 (3) MG/3ML SOLN Take 3 mLs by nebulization every 6 (six) hours as needed. Patient taking differently: Take 3 mLs by nebulization every 6 (six) hours as needed (SOB).  12/24/15   Hosie Poisson, MD  isosorbide mononitrate (IMDUR) 30 MG 24 hr tablet Take 30 mg by mouth daily. 07/27/15   [provider]  levofloxacin (LEVAQUIN) 500 MG tablet Take 1 tablet (500 mg total) by mouth daily. X 7 days 01/23/17   Patrecia Pour, MD  lisinopril (PRINIVIL,ZESTRIL) 40 MG tablet Hold medication for one week, follow up with  PCP , check renal function, if its back to baseline, can start taking it. Patient taking differently: Take 40 mg by mouth daily.  12/24/15   Hosie Poisson, MD  methocarbamol (ROBAXIN) 500 MG tablet Take 1 tablet (500 mg total) by mouth every 8 (eight) hours as needed for muscle spasms. 06/11/15   Lavina Hamman, MD  nitrofurantoin, macrocrystal-monohydrate, (MACROBID) 100 MG capsule Take 1 capsule (100 mg total) by mouth 2 (two) times daily. 02/25/17   Kinnie Feil, PA-C  Omega-3 Fatty Acids (FISH OIL PO) Take 1 capsule by mouth 2 (two) times daily.    [provider]  pantoprazole (PROTONIX) 40 MG tablet Take 1 tablet (40 mg total) by mouth every morning. Patient not taking: Reported on 01/21/2017 06/11/15   Lavina Hamman, MD  solifenacin (VESICARE) 5 MG tablet Take 5 mg by mouth every morning.     [provider]  Vitamin D, Ergocalciferol, (DRISDOL) 50000 units CAPS capsule Take 50,000 Units by mouth once a week. 01/12/17   [provider]  VITAMIN E PO Take 1 capsule by mouth daily.    [provider]    Family History Family History  Problem Relation Age of Onset  . Asthma Mother   . Diabetes Father   . Stroke Father   . Heart disease Father   . Arthritis Sister   . Asthma Sister   . Asthma Daughter   . Asthma Son   . Arthritis Sister   . Asthma  Sister     Social History Social History   Tobacco Use  . Smoking status: Former Smoker    Types: Cigarettes    Last attempt to quit: 03/25/2003    Years since quitting: 13.9  . Smokeless tobacco: Former Systems developer    Quit date: 05/27/1978  Substance Use Topics  . Alcohol use: Yes    Alcohol/week: 0.0 oz    Comment: years ago  no longer beer only  early 20's  . Drug use: Yes    Types: "Crack" cocaine, Other-see comments    Comment: years  ago     Allergies   Ibuprofen; Penicillins; Sulfa antibiotics; Doxycycline; Ibuprofen; Zithromax [azithromycin]; Adhesive [tape]; Adhesive [tape]; Azithromycin; Doxycycline; Penicillins; Sulfa antibiotics; and Sulfamethoxazole-trimethoprim   Review of Systems Review of Systems  Skin: Positive for color change.       +boil     Physical Exam Updated Vital Signs BP (!) 86/74 (BP Location: Right Arm)   Pulse 81   Temp 98.1 F (36.7 C) (Oral)   Resp 18   LMP 03/24/2009   SpO2 99%   Physical Exam  Constitutional: She is oriented to person, place, and time. She appears well-developed and well-nourished. No distress.  NAD. Morbidly obese woman.   HENT:  Head: Normocephalic and atraumatic.  Right Ear: External ear normal.  Left Ear: External ear normal.  Nose: Nose normal.  Moist mucous membranes.  Eyes: Conjunctivae and EOM are normal. No scleral icterus.  Neck: Normal range of motion. Neck supple.  Cardiovascular: Normal rate, regular rhythm and normal heart sounds.  No murmur heard. 2+ radial and DP pulses bilaterally.  Pulmonary/Chest: Effort normal and breath sounds normal.  Abdominal: Soft. There is no tenderness.  Obese abdomen. Decreased bowel sounds, difficult exam due to body habitus. No focal tenderness to Murphy's, McBurney's or suprapubic area.  Genitourinary:  Genitourinary Comments:  Pelvic exam performed with nurse at bedside chaperone. +External genitalia and inguinal folds are moist and glossy, slightly erythematous,  worse on left.  +2 x 2 cm solitary, tender, deep-seated inflamed nodule in interginous area of left inguinal area, oozing blood but no purulent discharge from the center. No purulent drainage expressed.  No groin lymphadenopathy.  Vaginal mucosa and cervix normal, pink without discharge or lesions.  Uterus in midline, smooth, not enlarged or tender. No CMT. Non palpable adnexa.  Musculoskeletal: Normal range of motion. She exhibits no deformity.  Neurological: She is alert and oriented to person, place, and time.  Skin: Skin is warm and dry. Capillary refill takes less than 2 seconds.  Psychiatric: She has a normal mood and affect. Her behavior is normal. Judgment and thought content normal.  Nursing note and vitals reviewed.    ED Treatments / Results  Labs (all labs ordered are listed, but only abnormal results are displayed) Labs Reviewed  WET PREP, GENITAL - Abnormal; Notable for the following components:      Result Value   WBC, Wet Prep HPF POC FEW (*)    All other components within normal limits  URINALYSIS, ROUTINE W REFLEX MICROSCOPIC - Abnormal; Notable for the following components:   APPearance HAZY (*)    Hgb urine dipstick MODERATE (*)    Leukocytes, UA LARGE (*)    Bacteria, UA RARE (*)    Squamous Epithelial / LPF 6-30 (*)    All other components within normal limits  URINE CULTURE  GC/CHLAMYDIA PROBE AMP ([place]) NOT AT [facility]    EKG  EKG Interpretation None       Radiology No results found.  Procedures Procedures (including critical care time)  Medications Ordered in ED Medications - No data to display   Initial Impression / Assessment and Plan / ED Course  I have reviewed the triage vital signs and the nursing notes.  Pertinent labs & imaging results that were available during my care of the patient were reviewed by me and considered in my medical decision making (see chart for details).  Clinical Course as of Feb 25 705  Wed Feb 25, 2017    0645 Hgb urine dipstick: (!) MODERATE [CG]  0645 Leukocytes, UA: (!) LARGE [CG]    Clinical Course User Index [CG] Kinnie Feil, PA-C    51 yo female with pertinent pmh of morbid obesity presents for what looks like hidradenitis suppurativa lesion to left intertriginous area of left groin. Surrounding area is moist and glossy, erythematous. No purulent drainage or extensive cellulitis. Nodule is already drainage blood, and does not require I&D today.  This has been ongoing for 4-5 months. No systemic symptoms of infection today.  Pelvic exam otherwise unremarkable.   Wet prep negative. U/A with UTI.  Her documented BP of SBP 86 likely cuff error, due to obesiity pt had cuff around her forearm and she maintained it raised above her head. Initial and d/c VS WNL.  Will d/c patient with abx for UTI and HS lesion. Advised f/u with general surgery and/or PCP for re-evaluation. Discussed return precautions, pt verbalized understanding and agreeable.  Final Clinical Impressions(s) / ED Diagnoses   Final diagnoses:  Acute cystitis with hematuria    ED Discharge Orders        Ordered    clindamycin (CLEOCIN) 150 MG capsule  3 times daily     02/25/17 0625    nitrofurantoin, macrocrystal-monohydrate, (MACROBID) 100 MG capsule  2 times daily     02/25/17 0705       Kinnie Feil, PA-C 02/25/17 1105  Ripley Fraise, MD 02/26/17 [phone]

## 2017-02-25 NOTE — Discharge Instructions (Addendum)
It is unclear why you continue to have this recurrent boil. Your swabs and testing today were normal. You need to follow up with your primary care doctor within a week for re-evaluation. Sometimes recurrent boils need referral to general surgery for evaluation. Return if you develop fevers, chills, increased swelling, pain, warmth. Take antibiotics as prescribed. Do warm compresses as often as possible.   You were found to have a urinary tract infection. We will treat you with two antibiotics today for your boil and urinary tract infection.

## 2017-02-26 LAB — URINE CULTURE

## 2017-04-10 IMAGING — CR DG CHEST 2V
1 series · 1 of 1 positions shown · non-contrast
Comparison: July 08, 2014.

CLINICAL DATA: Shortness of breath.

EXAM:
CHEST  2 VIEW

[w chest lat]
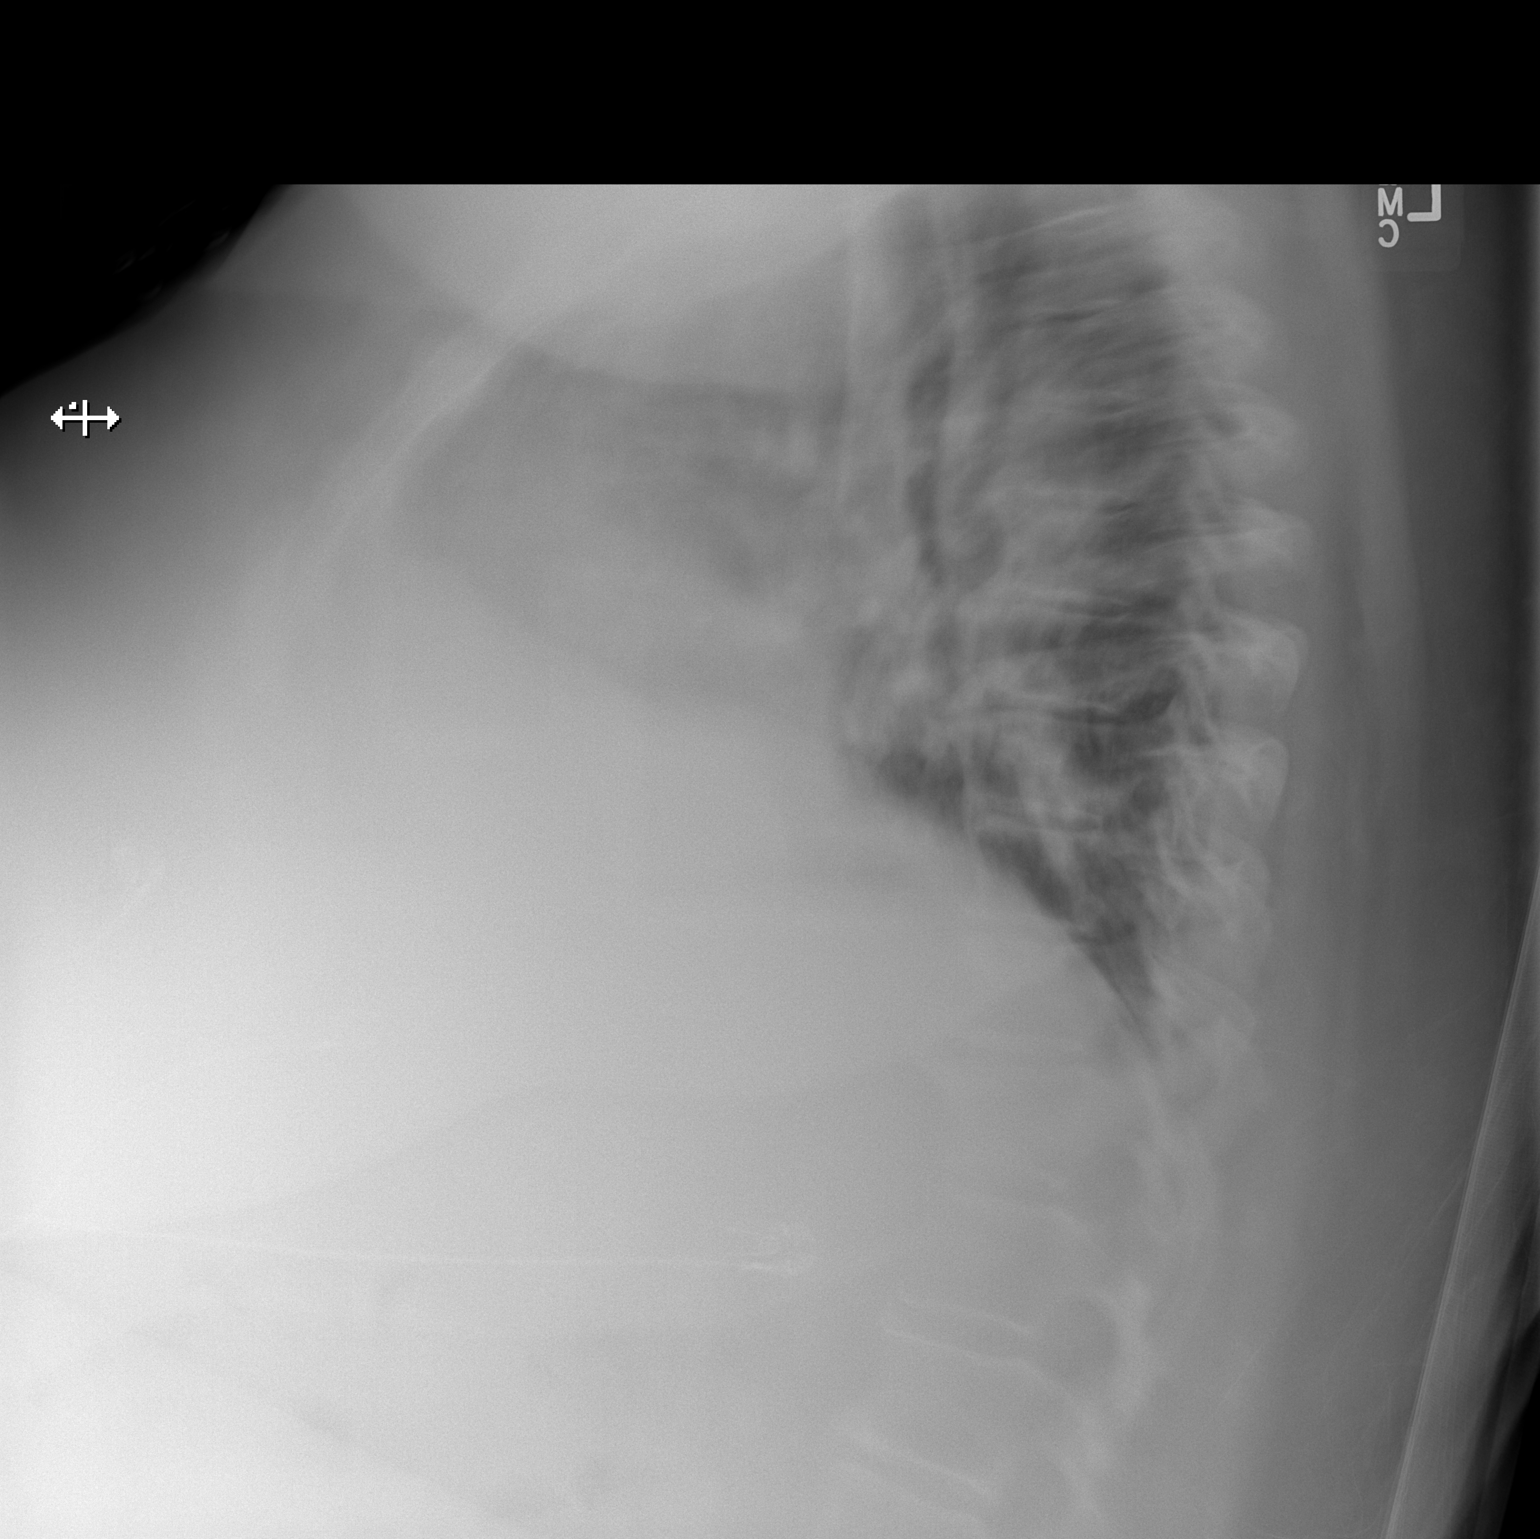

[1 of 1 positions shown; findings below may reference images not displayed]

FINDINGS: Mild cardiomegaly is noted. No pneumothorax or pleural effusion is
noted. Both lungs are clear. The visualized skeletal structures are
unremarkable.
IMPRESSION: No active cardiopulmonary disease.

## 2017-05-08 ENCOUNTER — Emergency Department (HOSPITAL_COMMUNITY)
Admission: EM | Admit: 2017-05-08 | Discharge: 2017-05-09 | Disposition: A | Discharge status: Home / Self Care | Payer: Medicaid Other | Attending: Emergency Medicine | Admitting: Emergency Medicine

## 2017-05-08 ENCOUNTER — Encounter (HOSPITAL_COMMUNITY): Payer: Self-pay | Admitting: Emergency Medicine

## 2017-05-08 ENCOUNTER — Emergency Department (HOSPITAL_COMMUNITY): Payer: Medicaid Other

## 2017-05-08 DIAGNOSIS — I130 Hypertensive heart and chronic kidney disease with heart failure and stage 1 through stage 4 chronic kidney disease, or unspecified chronic kidney disease: Secondary | ICD-10-CM | POA: Insufficient documentation

## 2017-05-08 DIAGNOSIS — R21 Rash and other nonspecific skin eruption: Secondary | ICD-10-CM | POA: Insufficient documentation

## 2017-05-08 DIAGNOSIS — I509 Heart failure, unspecified: Secondary | ICD-10-CM | POA: Diagnosis not present

## 2017-05-08 DIAGNOSIS — Z87891 Personal history of nicotine dependence: Secondary | ICD-10-CM | POA: Insufficient documentation

## 2017-05-08 DIAGNOSIS — M7918 Myalgia, other site: Secondary | ICD-10-CM | POA: Insufficient documentation

## 2017-05-08 DIAGNOSIS — R05 Cough: Secondary | ICD-10-CM | POA: Diagnosis not present

## 2017-05-08 DIAGNOSIS — Z8614 Personal history of Methicillin resistant Staphylococcus aureus infection: Secondary | ICD-10-CM | POA: Diagnosis not present

## 2017-05-08 DIAGNOSIS — N183 Chronic kidney disease, stage 3 (moderate): Secondary | ICD-10-CM | POA: Insufficient documentation

## 2017-05-08 DIAGNOSIS — I959 Hypotension, unspecified: Secondary | ICD-10-CM | POA: Diagnosis not present

## 2017-05-08 DIAGNOSIS — Z79899 Other long term (current) drug therapy: Secondary | ICD-10-CM | POA: Insufficient documentation

## 2017-05-08 LAB — URINALYSIS, ROUTINE W REFLEX MICROSCOPIC
BILIRUBIN URINE: NEGATIVE
Glucose, UA: NEGATIVE mg/dL
HGB URINE DIPSTICK: NEGATIVE
Ketones, ur: NEGATIVE mg/dL
Nitrite: NEGATIVE
Protein, ur: 30 mg/dL — AB
Specific Gravity, Urine: 1.019 (ref 1.005–1.030)
pH: 5 (ref 5.0–8.0)

## 2017-05-08 LAB — BASIC METABOLIC PANEL
Anion gap: 8 (ref 5–15)
BUN: 28 mg/dL — AB (ref 6–20)
CALCIUM: 8.4 mg/dL — AB (ref 8.9–10.3)
CO2: 32 mmol/L (ref 22–32)
CREATININE: 1.89 mg/dL — AB (ref 0.44–1.00)
Chloride: 102 mmol/L (ref 101–111)
GFR calc Af Amer: 34 mL/min — ABNORMAL LOW (ref >60)
GFR, EST NON AFRICAN AMERICAN: 29 mL/min — AB (ref >60)
GLUCOSE: 99 mg/dL (ref 65–99)
Potassium: 4.6 mmol/L (ref 3.5–5.1)
SODIUM: 142 mmol/L (ref 135–145)

## 2017-05-08 LAB — CBC
HEMATOCRIT: 40.7 % (ref 36.0–46.0)
Hemoglobin: 12.5 g/dL (ref 12.0–15.0)
MCH: 30.5 pg (ref 26.0–34.0)
MCHC: 30.7 g/dL (ref 30.0–36.0)
MCV: 99.3 fL (ref 78.0–100.0)
PLATELETS: 250 10*3/uL (ref 150–400)
RBC: 4.1 MIL/uL (ref 3.87–5.11)
RDW: 15.5 % (ref 11.5–15.5)
WBC: 10.3 10*3/uL (ref 4.0–10.5)

## 2017-05-08 LAB — I-STAT CG4 LACTIC ACID, ED: LACTIC ACID, VENOUS: 1.08 mmol/L (ref 0.5–1.9)

## 2017-05-08 LAB — I-STAT TROPONIN, ED: Troponin i, poc: 0 ng/mL (ref 0.00–0.08)

## 2017-05-08 NOTE — ED Notes (Signed)
Pt given warm blanket  Pt is requesting something for pain  States she is hurting all over  Explained to pt she would have to wait for a provider to see her to giver her pain medication

## 2017-05-08 NOTE — ED Notes (Signed)
Pt placed on 3 Liters [place]. Attempted to place PIV however unsuccessful. RN notified

## 2017-05-08 NOTE — ED Provider Notes (Signed)
[facility] DEPT Provider Note   CSN: [id number] Arrival date & time: 05/08/17  1723     History   Chief Complaint Chief Complaint  Patient presents with  . Hypotension    HPI Kara Vincent is a 52 y.o. female.  The history is provided by the patient and medical records.    52 y.o. F with hx of allergies, anxiety, arthritis, asthma, CHF, chronic pain, GERD, gout, HTN, HLP, morbid obesity, OSA, chronic respiratory failure on 3L O2, presenting to the ED for hypotension.  Patient states she went to her doctor for routine check-up and refill of her pain medications and was told her BP was low and was sent to the ED. Patient states she has had a cough lately with some sputum.  States she is supposed to be on 3 L oxygen chronically, however has not been using it lately.  She also complains of a rash in her groin that is been there for quite some time.  She denies any fever or chills.  States urine has been dark in color lately but denies any dysuria, abdominal pain, or flank pain.  No chest pain, dizziness, weakness, or recent syncopal events.  Past Medical History:  Diagnosis Date  . Allergy   . Anxiety   . Arthritis   . Asthma   . Asthma   . CHF (congestive heart failure) ([place])   . CHF (congestive heart failure) ([place])   . Chronic abdominal pain   . Chronic kidney disease   . Chronic pain    "all over"  . 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 GERD (gastroesophageal reflux disease)   . Gout   . Hidradenitis   . HLD (hyperlipidemia) 12/14/2015  . Homelessness   . Hyperlipemia   . Hypertension   . Low back pain   . Morbid obesity ([place])    uses motor wheel chair  . MRSA (methicillin resistant Staphylococcus aureus)    states about a year ago  . On home O2    3L N/C O2 continuously  . OSA (obstructive sleep apnea)    non-compliant with CPAP  . Oxygen deficiency   . Renal insufficiency   . Sleep apnea     Patient Active  Problem List   Diagnosis Date Noted  . Hypoxia 01/22/2017  . Acute pulmonary edema ([place]) 12/14/2015  . Essential hypertension 12/14/2015  . HLD (hyperlipidemia) 12/14/2015  . GERD (gastroesophageal reflux disease) 12/14/2015  . Gout 12/14/2015  . Acute on chronic diastolic (congestive) heart failure ([place]) 12/14/2015  . Chest pain 12/14/2015  . Shortness of breath 06/09/2015  . Atypical chest pain   . Darier's disease   . Leukocytosis   . 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 Acute bronchitis 11/20/2014  . Right-sided heart failure ([place]) 11/20/2014  . OSA (obstructive sleep apnea)   . CKD (chronic kidney disease) stage 3, GFR 30-59 ml/min ([facility]) 11/17/2014  . Asthma exacerbation 11/16/2014  . Venous stasis 11/16/2014  . Acute on chronic respiratory failure with hypoxia ([place]) 11/16/2014  . Hypotension 08/08/2012  . Chronic pain 08/24/2011  . Morbid obesity ([place]) 08/24/2011    Past Surgical History:  Procedure Laterality Date  . BACK SURGERY    . CESAREAN SECTION    . CESAREAN SECTION    . FRACTURE SURGERY    . TUBAL LIGATION      OB History    Gravida Para Term Preterm AB Living   0 0  0 0 0     SAB TAB Ectopic Multiple Live Births   0 0 0           Home Medications    Prior to Admission medications   Medication Sig Start Date End Date Taking? Authorizing Provider  albuterol (PROVENTIL HFA;VENTOLIN HFA) 108 (90 Base) MCG/ACT inhaler Inhale 1-2 puffs into the lungs every 6 (six) hours as needed for wheezing or shortness of breath.   Yes [provider]  allopurinol (ZYLOPRIM) 100 MG tablet Take 1 tablet (100 mg total) by mouth every morning. 01/22/14  Yes Donne Hazel, MD  ALPRAZolam Duanne Moron) 1 MG tablet Take 1 mg by mouth 3 (three) times daily as needed for anxiety (anxiety).    Yes [provider]  ammonium lactate (AMLACTIN) 12 % cream Apply 1 g topically daily as needed for dry skin. 04/22/14  Yes Linton Flemings, MD  aspirin 325 MG tablet Take 325 mg by mouth  every morning.    Yes [provider]  atorvastatin (LIPITOR) 20 MG tablet Take 1 tablet (20 mg total) by mouth every morning. 06/11/15  Yes Lavina Hamman, MD  benzonatate (TESSALON) 100 MG capsule Take 100 mg by mouth 3 (three) times daily as needed for cough.   Yes [provider]  butalbital-acetaminophen-caffeine (FIORICET, ESGIC) 50-325-40 MG tablet Take 1 tablet by mouth every 6 (six) hours as needed for headache. 06/11/15  Yes Lavina Hamman, MD  doxycycline (ADOXA) 100 MG tablet Take 100 mg by mouth 2 (two) times daily.   Yes [provider]  Emollient (EUCERIN) lotion Apply 5 mLs topically 2 (two) times daily as needed for dry skin. 04/22/14  Yes Linton Flemings, MD  furosemide (LASIX) 20 MG tablet Take 1 tablet (20 mg total) by mouth daily. 12/25/15  Yes Hosie Poisson, MD  HYDROcodone-acetaminophen (NORCO) 10-325 MG tablet Take 1 tablet by mouth every 4 (four) hours as needed for moderate pain. 12/24/15  Yes Hosie Poisson, MD  hydrOXYzine (ATARAX/VISTARIL) 25 MG tablet Take 25 mg by mouth 3 (three) times daily as needed for anxiety or itching (anxiety and itching).    Yes [provider]  indomethacin (INDOCIN SR) 75 MG CR capsule Take 75 mg by mouth 2 (two) times daily with a meal.   Yes [provider]  ipratropium-albuterol (DUONEB) 0.5-2.5 (3) MG/3ML SOLN Take 3 mLs by nebulization every 6 (six) hours as needed. Patient taking differently: Take 3 mLs by nebulization every 6 (six) hours as needed (SOB).  12/24/15  Yes Hosie Poisson, MD  isosorbide mononitrate (IMDUR) 30 MG 24 hr tablet Take 30 mg by mouth daily. 07/27/15  Yes [provider]  lisinopril (PRINIVIL,ZESTRIL) 40 MG tablet Hold medication for one week, follow up with PCP , check renal function, if its back to baseline, can start taking it. Patient taking differently: Take 40 mg by mouth daily.  12/24/15  Yes Hosie Poisson, MD  Omega-3 Fatty Acids (FISH OIL PO) Take 1 capsule by mouth  2 (two) times daily.   Yes [provider]  pantoprazole (PROTONIX) 40 MG tablet Take 1 tablet (40 mg total) by mouth every morning. 06/11/15  Yes Lavina Hamman, MD  solifenacin (VESICARE) 5 MG tablet Take 5 mg by mouth every morning.    Yes [provider]  Vitamin D, Ergocalciferol, (DRISDOL) 50000 units CAPS capsule Take 50,000 Units by mouth once a week. 01/12/17  Yes [provider]  VITAMIN E PO Take 1 capsule by  mouth daily.   Yes [provider]  budesonide-formoterol (SYMBICORT) 160-4.5 MCG/ACT inhaler Inhale 2 puffs into the lungs 2 (two) times daily. Patient not taking: Reported on 10/02/2016 11/21/14   Debbe Odea, MD  dextromethorphan-guaiFENesin [facility] DM) 30-600 MG 12hr tablet Take 1 tablet by mouth 2 (two) times daily. Patient not taking: Reported on 10/02/2016 06/11/15   Lavina Hamman, MD  folic acid (FOLVITE) 1 MG tablet Take 1 tablet (1 mg total) by mouth daily. Patient not taking: Reported on 05/08/2017 01/22/14   Donne Hazel, MD  gabapentin (NEURONTIN) 100 MG capsule Take 1 capsule (100 mg total) by mouth 2 (two) times daily. Patient not taking: Reported on 10/02/2016 06/11/15   Lavina Hamman, MD  levofloxacin (LEVAQUIN) 500 MG tablet Take 1 tablet (500 mg total) by mouth daily. X 7 days Patient not taking: Reported on 05/08/2017 01/23/17   Patrecia Pour, MD  nitrofurantoin, macrocrystal-monohydrate, (MACROBID) 100 MG capsule Take 1 capsule (100 mg total) by mouth 2 (two) times daily. Patient not taking: Reported on 05/08/2017 02/25/17   Kinnie Feil, PA-C  diphenhydrAMINE [facility]) 25 MG tablet Take 25 mg by mouth at bedtime as needed.  06/14/11  [provider]    Family History Family History  Problem Relation Age of Onset  . Asthma Mother   . Diabetes Father   . Stroke Father   . Heart disease Father   . Arthritis Sister   . Asthma Sister   . Asthma Daughter   . Asthma Son   . Arthritis Sister   . Asthma  Sister     Social History Social History   Tobacco Use  . Smoking status: Former Smoker    Types: Cigarettes    Last attempt to quit: 03/25/2003    Years since quitting: 14.1  . Smokeless tobacco: Former Systems developer    Quit date: 05/27/1978  Substance Use Topics  . Alcohol use: Yes    Alcohol/week: 0.0 oz    Comment: years ago  no longer beer only  early 20's  . Drug use: Yes    Types: "Crack" cocaine, Other-see comments    Comment: years  ago     Allergies   Ibuprofen; Penicillins; Sulfa antibiotics; Doxycycline; Ibuprofen; Zithromax [azithromycin]; Adhesive [tape]; Adhesive [tape]; Azithromycin; Doxycycline; Penicillins; Sulfa antibiotics; and Sulfamethoxazole-trimethoprim   Review of Systems Review of Systems  Respiratory: Positive for cough.   Skin: Positive for rash.  All other systems reviewed and are negative.    Physical Exam Updated Vital Signs BP (!) 103/59 (BP Location: Right Arm)   Pulse 70   Temp 98.2 F (36.8 C) (Oral)   Resp 20   LMP 03/24/2009   SpO2 92%   Physical Exam  Constitutional: She is oriented to person, place, and time. She appears well-developed and well-nourished.  Morbidly obese, disheveled appearing  HENT:  Head: Normocephalic and atraumatic.  Mouth/Throat: Oropharynx is clear and moist.  Eyes: Conjunctivae and EOM are normal. Pupils are equal, round, and reactive to light.  Neck: Normal range of motion.  Cardiovascular: Normal rate, regular rhythm and normal heart sounds.  Pulmonary/Chest: Effort normal. No stridor. No respiratory distress. She has wheezes.  Faint wheezes, no acute distress  Abdominal: Soft. Bowel sounds are normal. There is no tenderness. There is no rebound.  Musculoskeletal: Normal range of motion.  Neurological: She is alert and oriented to person, place, and time.  Skin: Skin is warm and dry. Rash noted.  Candidal rash in the groin,  weeping clear fluid; no purulent drainage, no signs of superimposed infection    Psychiatric: She has a normal mood and affect.  Nursing note and vitals reviewed.    ED Treatments / Results  Labs (all labs ordered are listed, but only abnormal results are displayed) Labs Reviewed  BASIC METABOLIC PANEL - Abnormal; Notable for the following components:      Result Value   BUN 28 (*)    Creatinine, Ser 1.89 (*)    Calcium 8.4 (*)    GFR calc non Af Amer 29 (*)    GFR calc Af Amer 34 (*)    All other components within normal limits  URINALYSIS, ROUTINE W REFLEX MICROSCOPIC - Abnormal; Notable for the following components:   Color, Urine AMBER (*)    APPearance CLOUDY (*)    Protein, ur 30 (*)    Leukocytes, UA SMALL (*)    Bacteria, UA RARE (*)    Squamous Epithelial / LPF 6-30 (*)    All other components within normal limits  URINE CULTURE  CBC  BRAIN NATRIURETIC PEPTIDE  I-STAT TROPONIN, ED  I-STAT CG4 LACTIC ACID, ED    EKG  EKG Interpretation None       Radiology Dg Chest 2 View  Result Date: 05/08/2017 CLINICAL DATA:  Cough and intermittent shortness of breath. Left-sided chest pain. EXAM: CHEST  2 VIEW COMPARISON:  01/21/2017 FINDINGS: Unchanged cardiomegaly and mediastinal contours. Unchanged vascular congestion. Increasing streaky bibasilar atelectasis. No pleural effusion or pneumothorax. Technically limited exam due to soft tissue attenuation from body habitus. IMPRESSION: 1. Unchanged cardiomegaly and vascular congestion. 2. Increasing bibasilar atelectasis. Electronically Signed   By: Jeb Levering M.D.   On: 05/08/2017 23:05    Procedures Procedures (including critical care time)  Medications Ordered in ED Medications - No data to display   Initial Impression / Assessment and Plan / ED Course  I have reviewed the triage vital signs and the nursing notes.  Pertinent labs & imaging results that were available during my care of the patient were reviewed by me and considered in my medical decision making (see chart for  details).  52 year old female sent in by PCP for episode of hypotension.  She was at PCP office routine check and refill of her chronic pain medicines when low blood pressure was discovered.  Patient does report cough and dark urine recently but no fever, chills, or other signs of infection.  She does have a candidal rash in her groin that apparently has been present for several months.  She has been treating with Monistat which we discussed was not appropriate therapy for candidal skin infection.  Screening labs obtained from triage, overall reassuring.  Renal function appears around her baseline compared with prior values.  Will add on chest x-ray, UA, lactate.  Patient is in no acute distress.  I do not feel her blood pressures here are the most accurate as her arm is too large to fit even her largest cuff and it is been taken around her wrist.  She remains awake, alert, oriented.  Remainder of workup pending.  1:37 AM I went in to discuss patient's results with her.  Her caregiver is at the bedside and begins arguing with me as soon as I walk into the room.  He states "oh, so  you're done looking at the constellation now".  I discussed with him that there are multiple other patients here waiting to be seen and I have been trying to get  back to them as soon as possible.  I have gone over patient's results, all of which are overall reassuring.  There are no clinical signs of infection.  Normal WBC count, normal lactate.  Her chest x-ray is clear aside from some chronic vascular congestion, however BNP is within normal limits.  She has remained stable on her baseline 3 L oxygen which she uses at all times at home.  She does have candidal infection in her groin, however no superimposed infection.  At this time, I do not see any emergent reason that patient would require admission.  When I discussed this with her she began complaining about worsening pain in her legs, back, etc.  Patient has long-standing  history of chronic pain.  Caregiver does disclose to me that she did not get her pain medication refilled at her primary care office today because her blood pressure was a little low.  I discussed with them that they will need to follow-up with primary care in order to have this refilled.  Patient begins complaining that she simply cannot go home due to difficulty walking, however she has motorized scooter at bedside that she uses predominantly to get around.  Caregiver is also stating she needs injections of cipro for her rash.  I discussed with him that this is not appropriate therapy for a candidal rash of the skin.  I have again reiterated there is no emergent concern or indication for admission at this time.  She will need to follow-up closely with her primary care doctor.  Of note, BP has not changed dramatically since patient has been in the ED.  No profound hypotension, MAP's remain appropriate.  Patient ultimately discharged.  She and caregiver who was also seen as a patient here in the ED tonight discussed that they have no ride home, they were given a bus pass and will be waiting in the ER until the bus starts running in the morning.  They were both given food and drink.  Final Clinical Impressions(s) / ED Diagnoses   Final diagnoses:  Rash    ED Discharge Orders    None       Larene Pickett, PA-C 05/09/17 0434    Drenda Freeze, MD 05/11/17 [phone]

## 2017-05-08 NOTE — ED Notes (Signed)
Urine sample sent down, not enough for culture.

## 2017-05-08 NOTE — ED Notes (Signed)
IV team at bedside 

## 2017-05-08 NOTE — ED Triage Notes (Signed)
Patient here from PCP with complaints of hypotension. Reports that she has pain all over with swelling. Note states that patient came in for a refill on pain medications and blood pressure was low. Readings 97/58, 76/53, 90/60. Sent here for evaluation.

## 2017-05-09 LAB — BRAIN NATRIURETIC PEPTIDE: B NATRIURETIC PEPTIDE 5: 19.4 pg/mL (ref 0.0–100.0)

## 2017-05-09 MED ORDER — NYSTATIN 100000 UNIT/GM EX CREA: TOPICAL_CREAM | CUTANEOUS | 0 refills | Status: DC

## 2017-05-09 NOTE — Discharge Instructions (Signed)
Use the nystatin cream in your groin. Follow-up closely with your primary care doctor. Return here for any new/acute changes.

## 2017-06-03 IMAGING — CR DG CHEST 2V
2 series · 2 of 2 positions shown · non-contrast
Comparison: 09/22/2014

CLINICAL DATA: Right-sided chest pain and shortness of breath.
Lower extremity swelling.

EXAM:
CHEST  2 VIEW

[x chest ap]
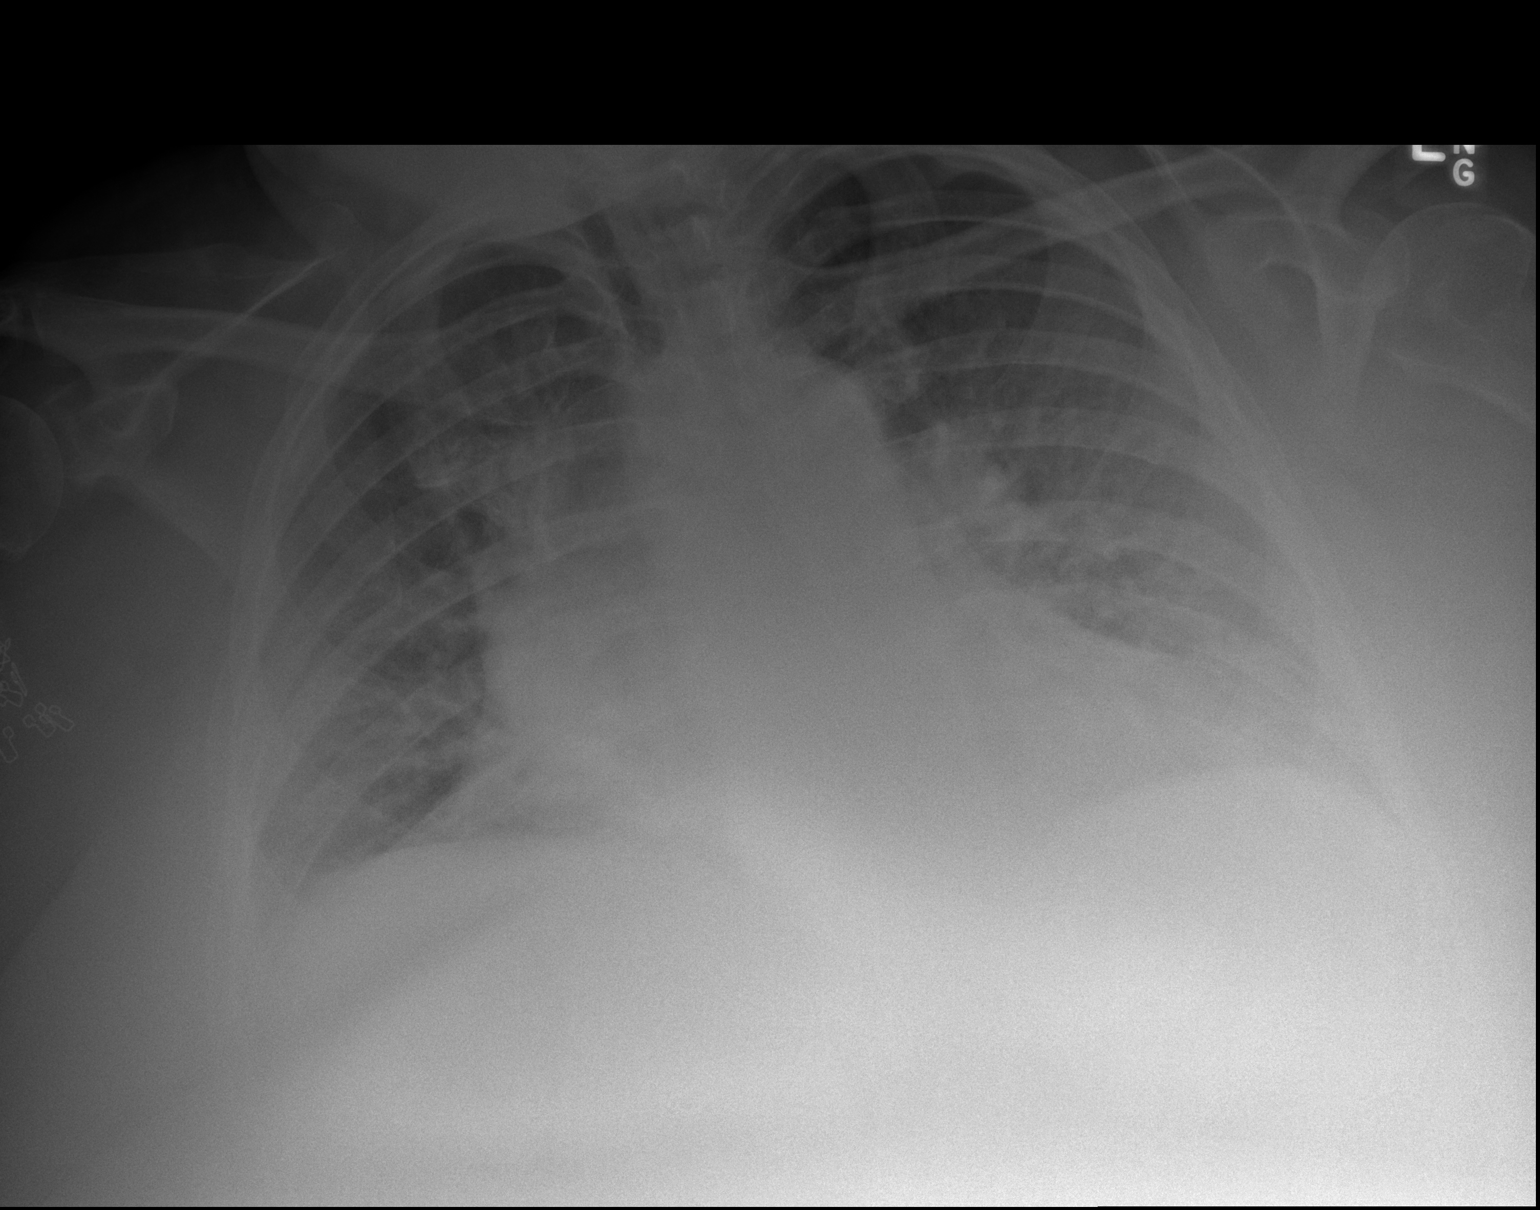

[w chest lat]
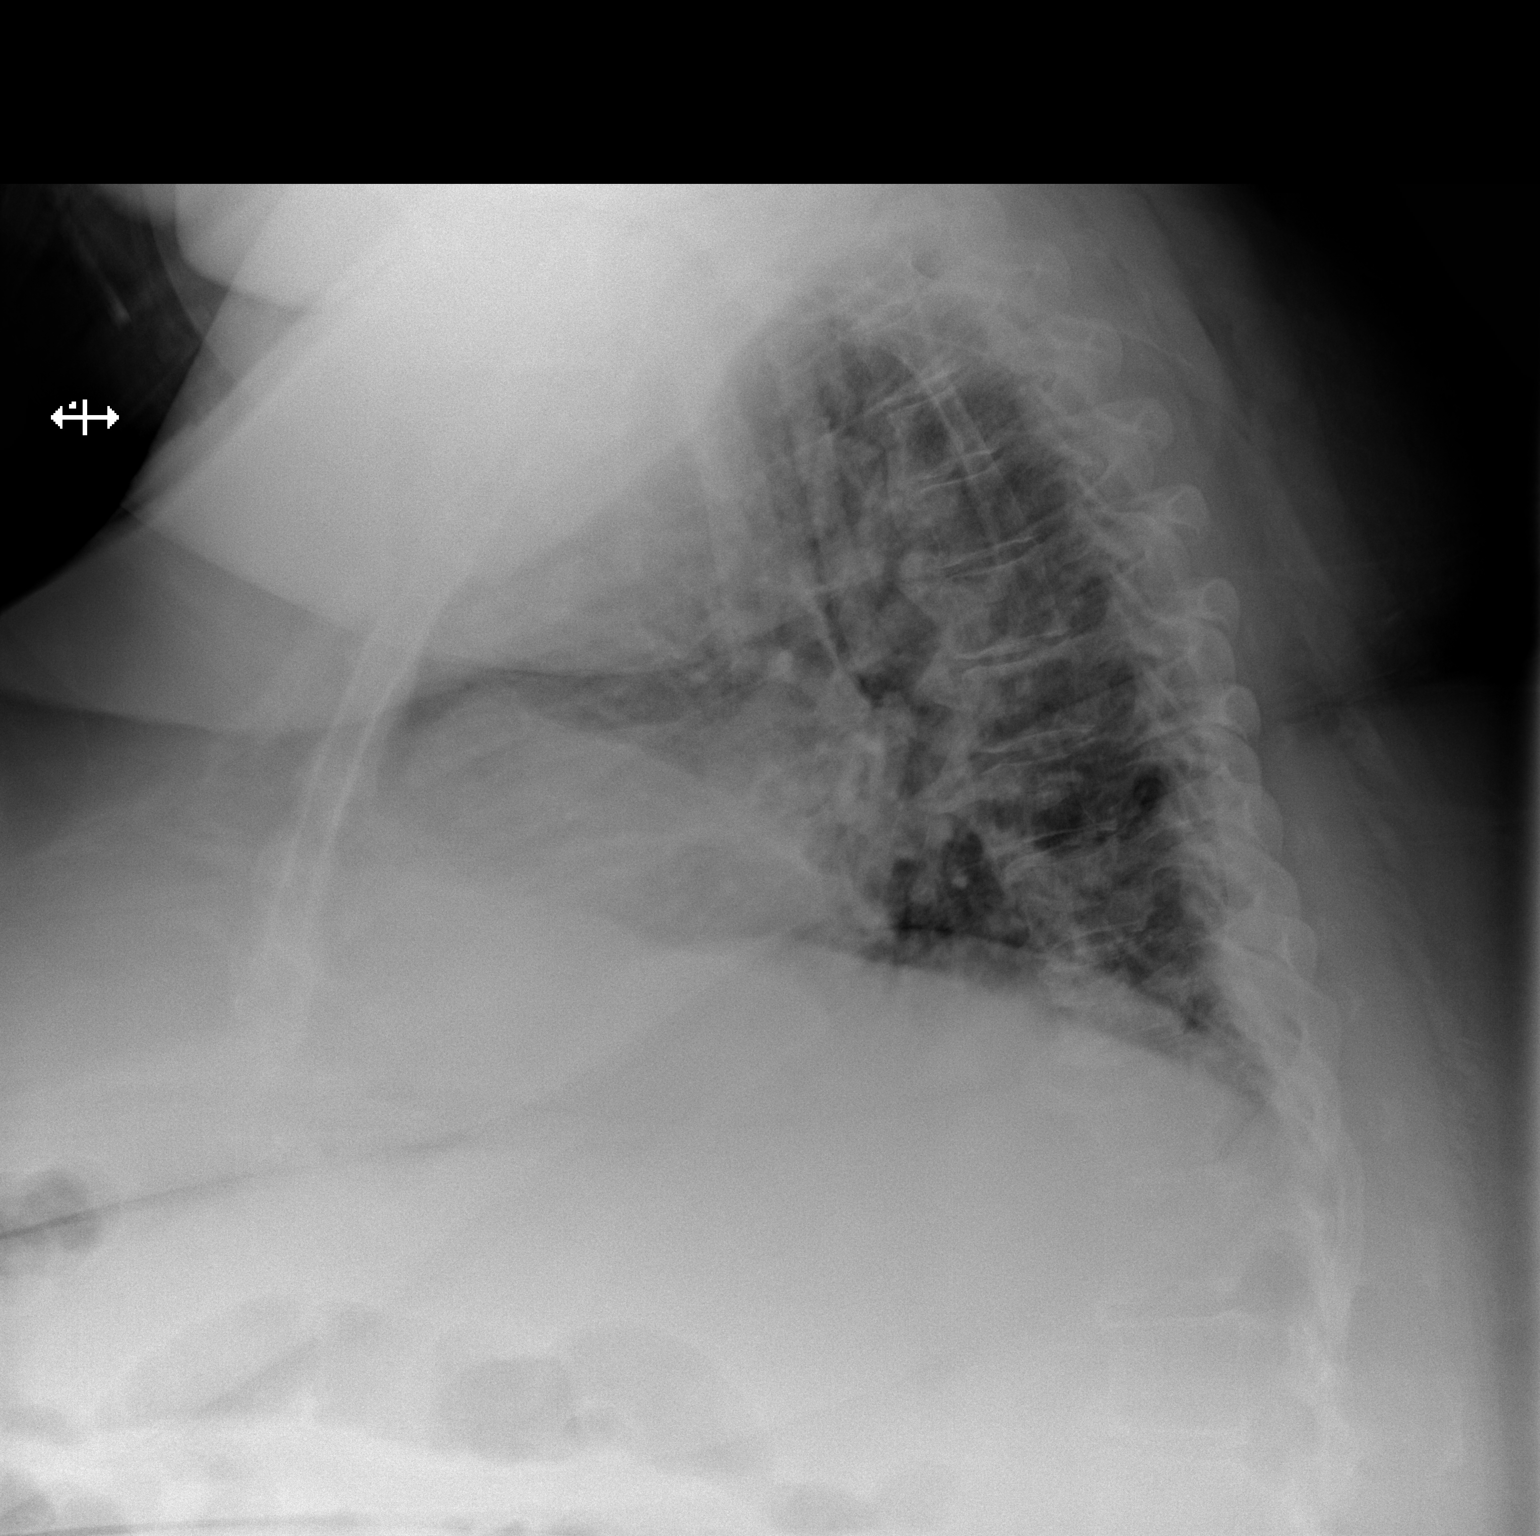

[2 of 2 positions shown; findings below may reference images not displayed]

FINDINGS: Low lung volumes are again noted. Cardiomegaly and pulmonary
vascular congestion appears stable. No evidence of pulmonary
consolidation or overt pulmonary edema. No evidence of pleural
effusion.
IMPRESSION: Stable low lung volumes, with cardiomegaly and pulmonary vascular
congestion. No acute findings.

## 2017-06-03 IMAGING — CT CT ANGIO CHEST
2 of 6 series · 18 of 36 positions shown · IV contrast (OMNIPAQUE 350)
Comparison: Radiographs earlier this day.  Chest CT 03/14/2012

CLINICAL DATA: Leg swelling and shortness of breath. Elevated
D-dimer.

EXAM:
CT ANGIOGRAPHY CHEST WITH CONTRAST
TECHNIQUE: Multidetector CT imaging of the chest was performed using the
standard protocol during bolus administration of intravenous
contrast. Multiplanar CT image reconstructions and MIPs were
obtained to evaluate the vascular anatomy.
CONTRAST:  100mL OMNIPAQUE IOHEXOL 350 MG/ML SOLN

[Series 6: coronal mpr · coronal · 0.49mm/px · 1 of 131 slices shown]
[im 66/131  mediastinal]
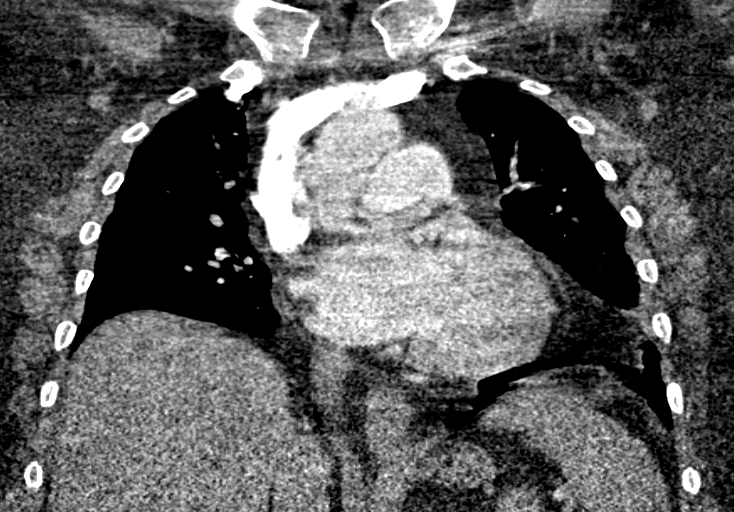

[Series 11: thins for pacs · axial · 0.74mm/px · z∈[+1164,+1388]mm · 17 of 250 slices shown]
[im 13/250  lung]
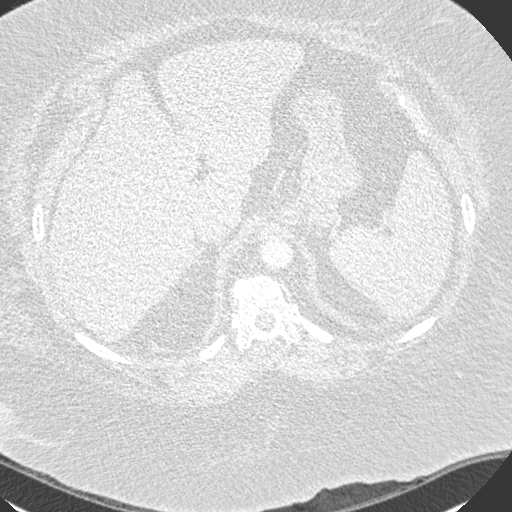
[im 25/250  mediastinal]
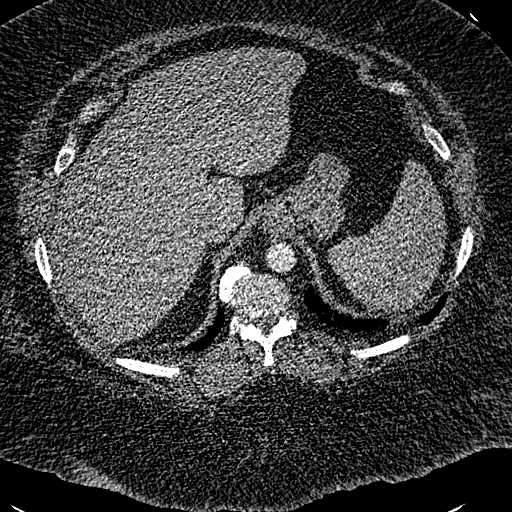
[im 38/250  lung]
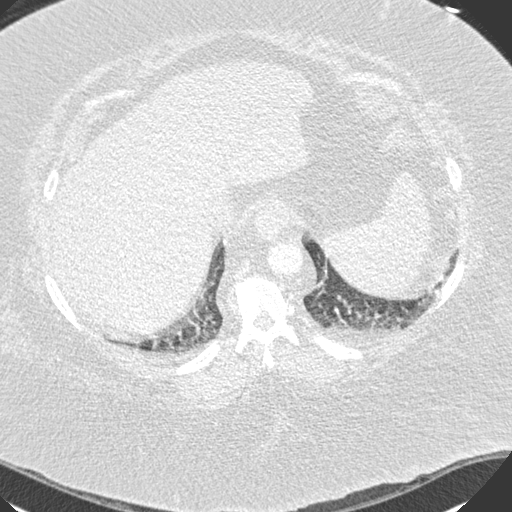
[im 50/250  mediastinal]
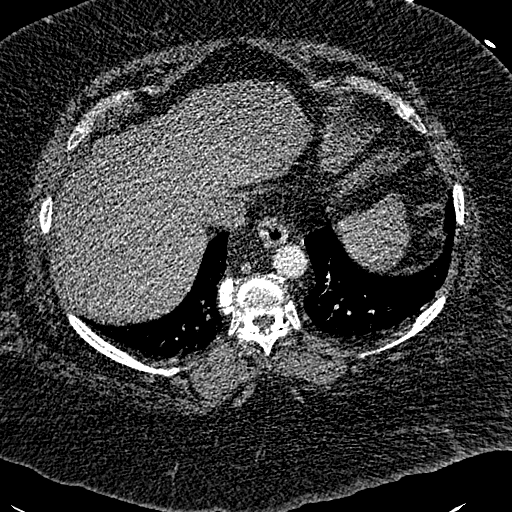
[im 75/250  lung]
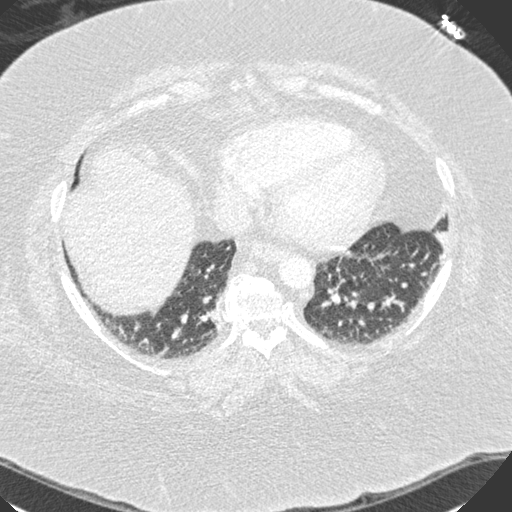
[im 88/250  mediastinal]
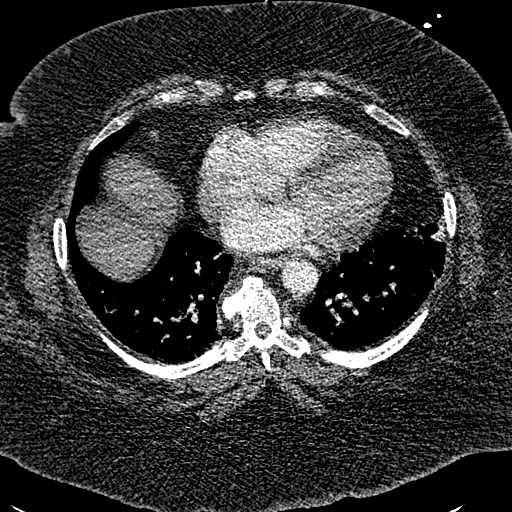
[im 100/250  lung]
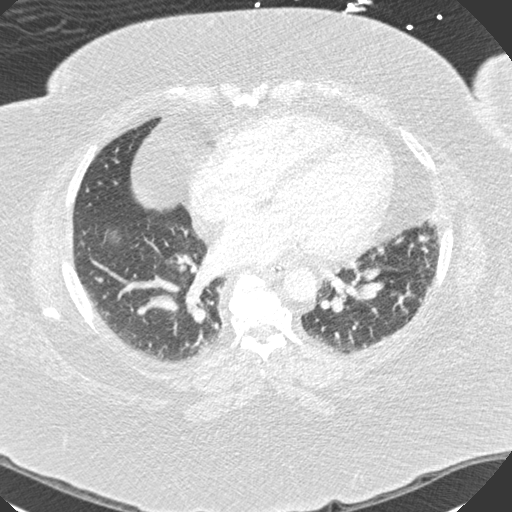
[im 113/250  mediastinal]
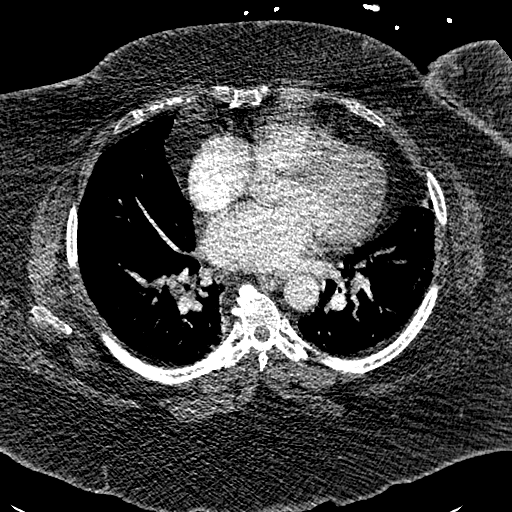
[im 125/250  lung]
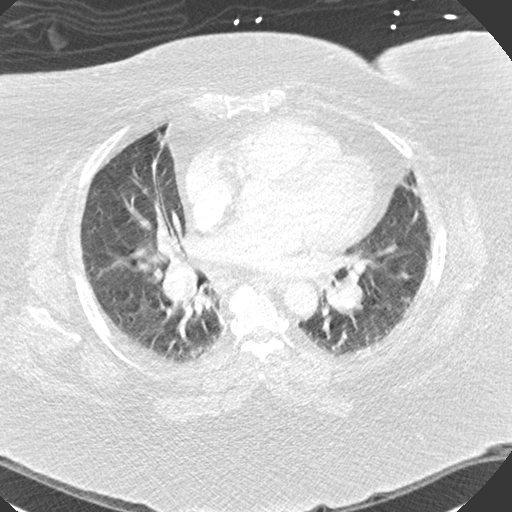
[im 137/250  mediastinal]
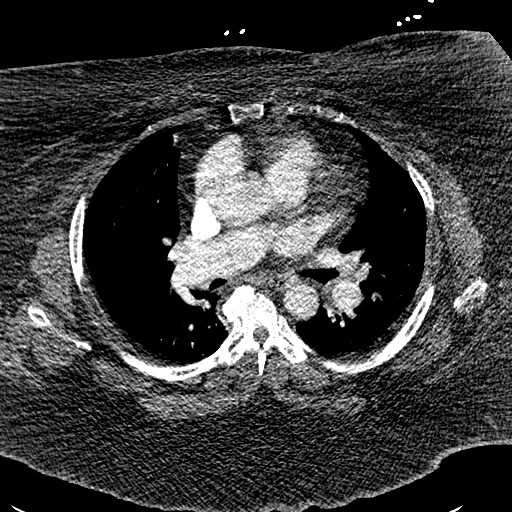
[im 150/250  lung]
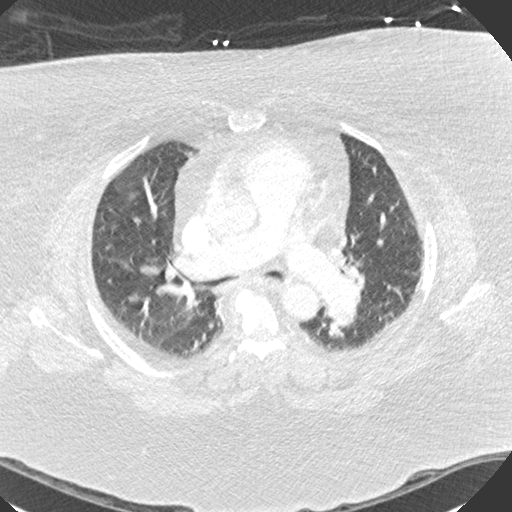
[im 162/250  mediastinal]
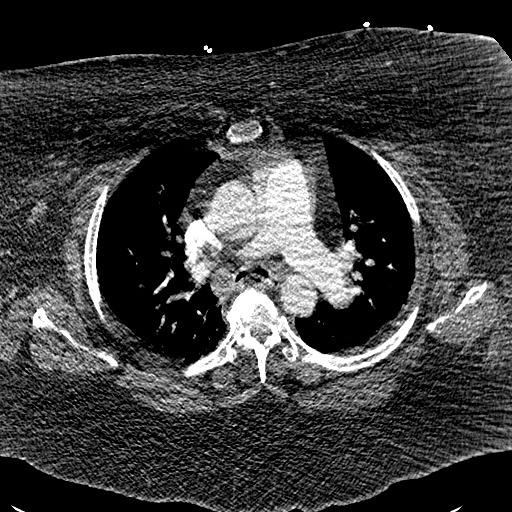
[im 175/250  lung]
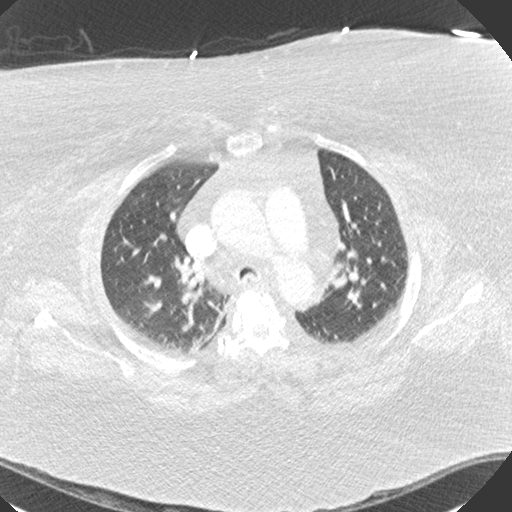
[im 200/250  mediastinal]
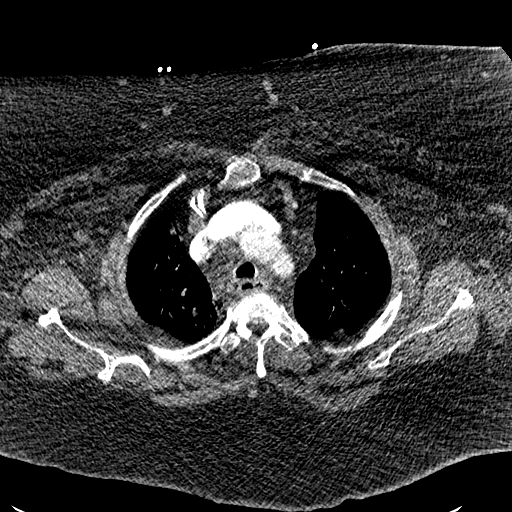
[im 212/250  lung]
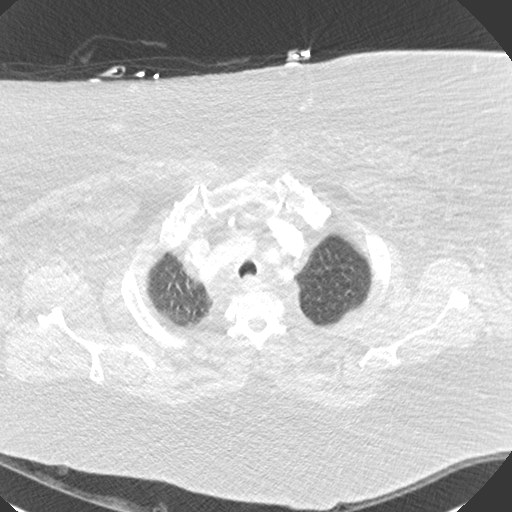
[im 225/250  mediastinal]
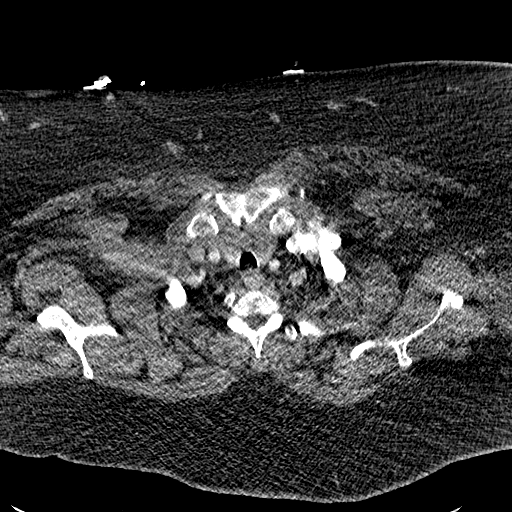
[im 237/250  lung]
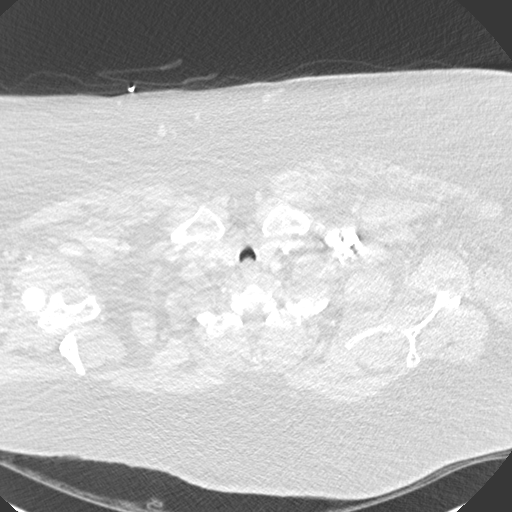

[18 of 36 positions shown; findings below may reference images not displayed]

FINDINGS: No central pulmonary embolus involving the main or lobar pulmonary
arteries, technically fair/poor study. The segmental and
subsegmental branches cannot be assessed due to contrast bolus
timing and soft tissue attenuation from body habitus.

There is mild multi chamber cardiomegaly. No discrete right heart
strain or contrast refluxing into the IVC. Thoracic aorta is normal
in caliber with mild atherosclerosis. Scattered minimal coronary
artery calcifications. There is no pleural or pericardial effusion.
Prominent upper paratracheal lymph node measuring 10 mm short axis
dimension, unchanged. There is a prominent prevascular lymph node
measuring 9 mm short axis dimension, likely reactive. No definite
hilar adenopathy.

Limited assessment of lung parenchyma, no consolidation to suggest
pneumonia. No dominant pulmonary mass. Mild dependent and lingular
atelectasis.

No acute abnormality in the included upper abdomen. Probable
hepatomegaly.

There are no acute or suspicious osseous abnormalities. There is
degenerative change in the spine.

Review of the MIP images confirms the above findings.
IMPRESSION: 1. No central pulmonary embolus in the main or lobar pulmonary
arteries. The segmental and subsegmental branches cannot be
assessed.
2. No acute findings.  Cardiomegaly and scattered atelectasis.

## 2017-06-07 IMAGING — DX DG CHEST 1V PORT
1 series · 1 of 1 positions shown · non-contrast
Comparison: 11/15/2014

CLINICAL DATA: Followup of pulmonary edema. Asthma. Hypertension.
Morbid obesity.

EXAM:
PORTABLE CHEST - 1 VIEW

[chest ap]
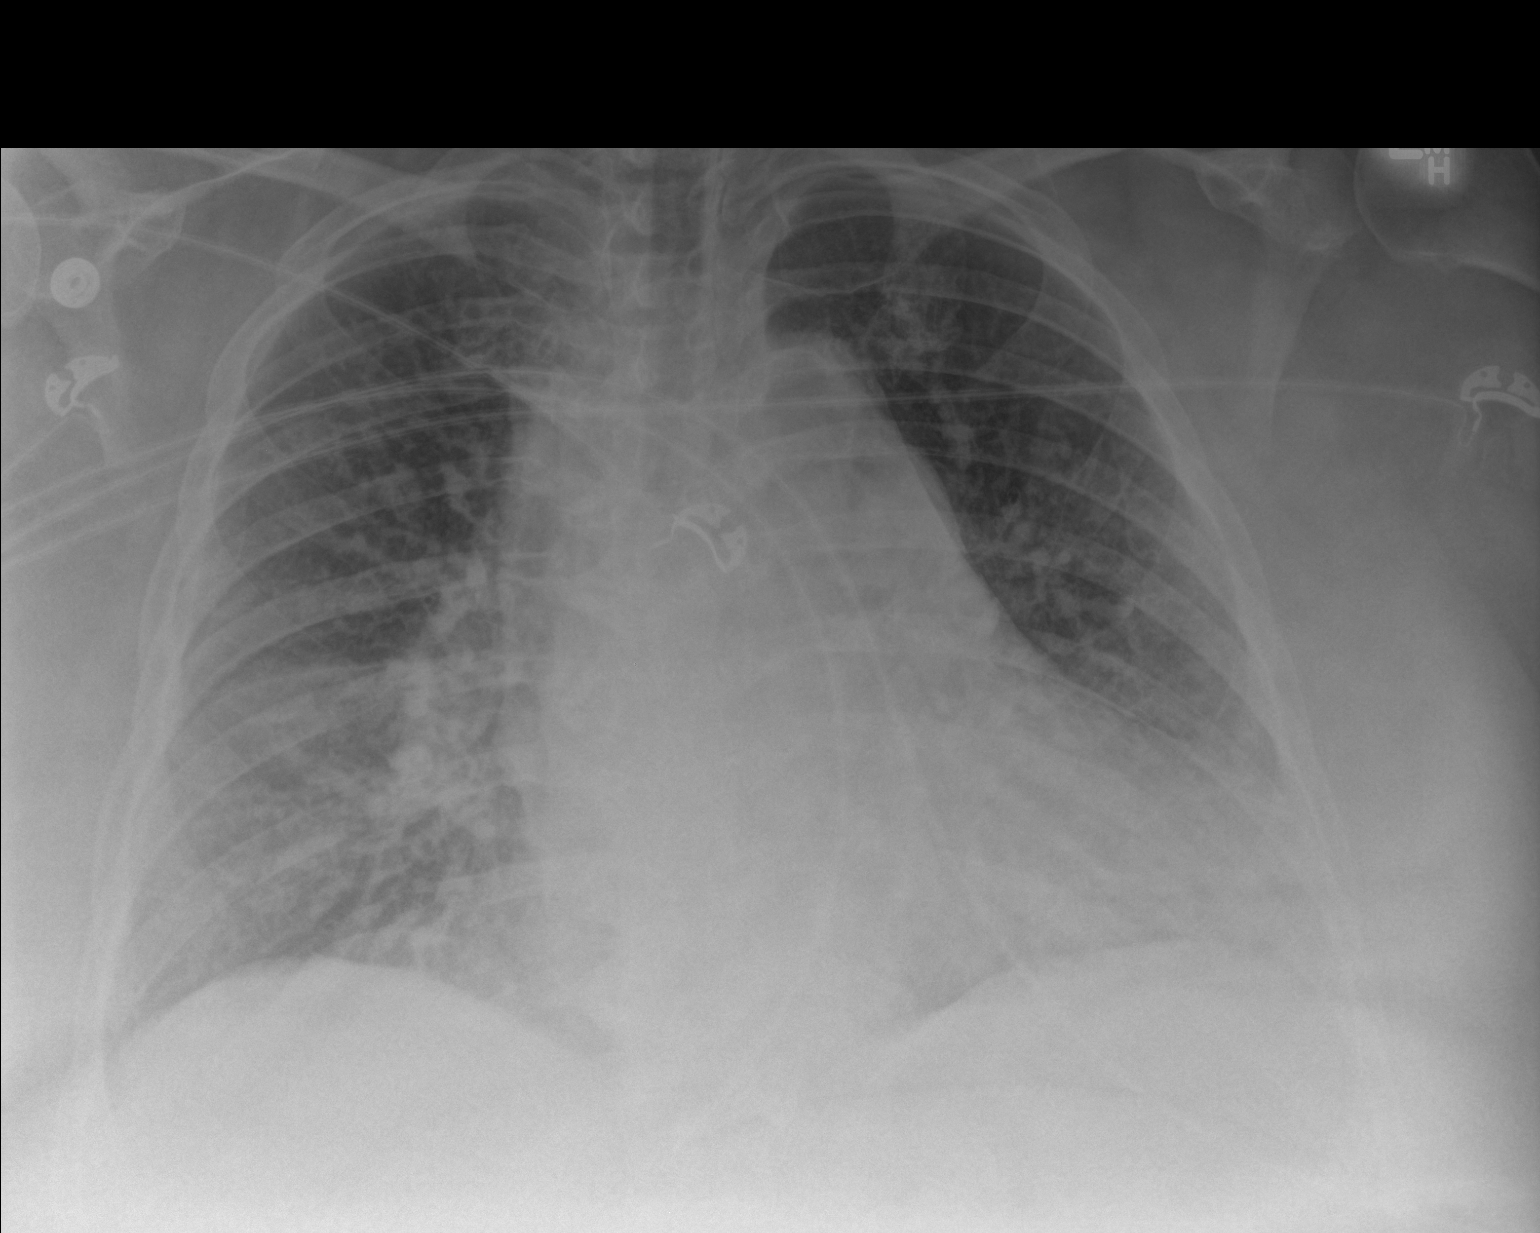

[1 of 1 positions shown; findings below may reference images not displayed]

FINDINGS: degraded exam due to AP portable technique and patient body habitus.
Midline trachea. Cardiomegaly accentuated by AP portable technique.
Atherosclerosis in the transverse aorta. No right and no definite
left pleural effusion. No pneumothorax. Interstitial edema is likely
similar, given improved inspiratory effort on the current exam.
Probable left base atelectasis versus superimposition of overlying
soft tissues.
IMPRESSION: Cardiomegaly with persistent congestive heart failure.

Decreased sensitivity and specificity exam due to technique related
factors, as described above.

## 2017-06-09 IMAGING — CR DG CHEST 2V
3 series · 3 of 3 positions shown · non-contrast
Comparison: November 19, 2014

CLINICAL DATA: Right chest pain and dyspnea.

EXAM:
CHEST  2 VIEW

[w chest lat (1 of 2)]
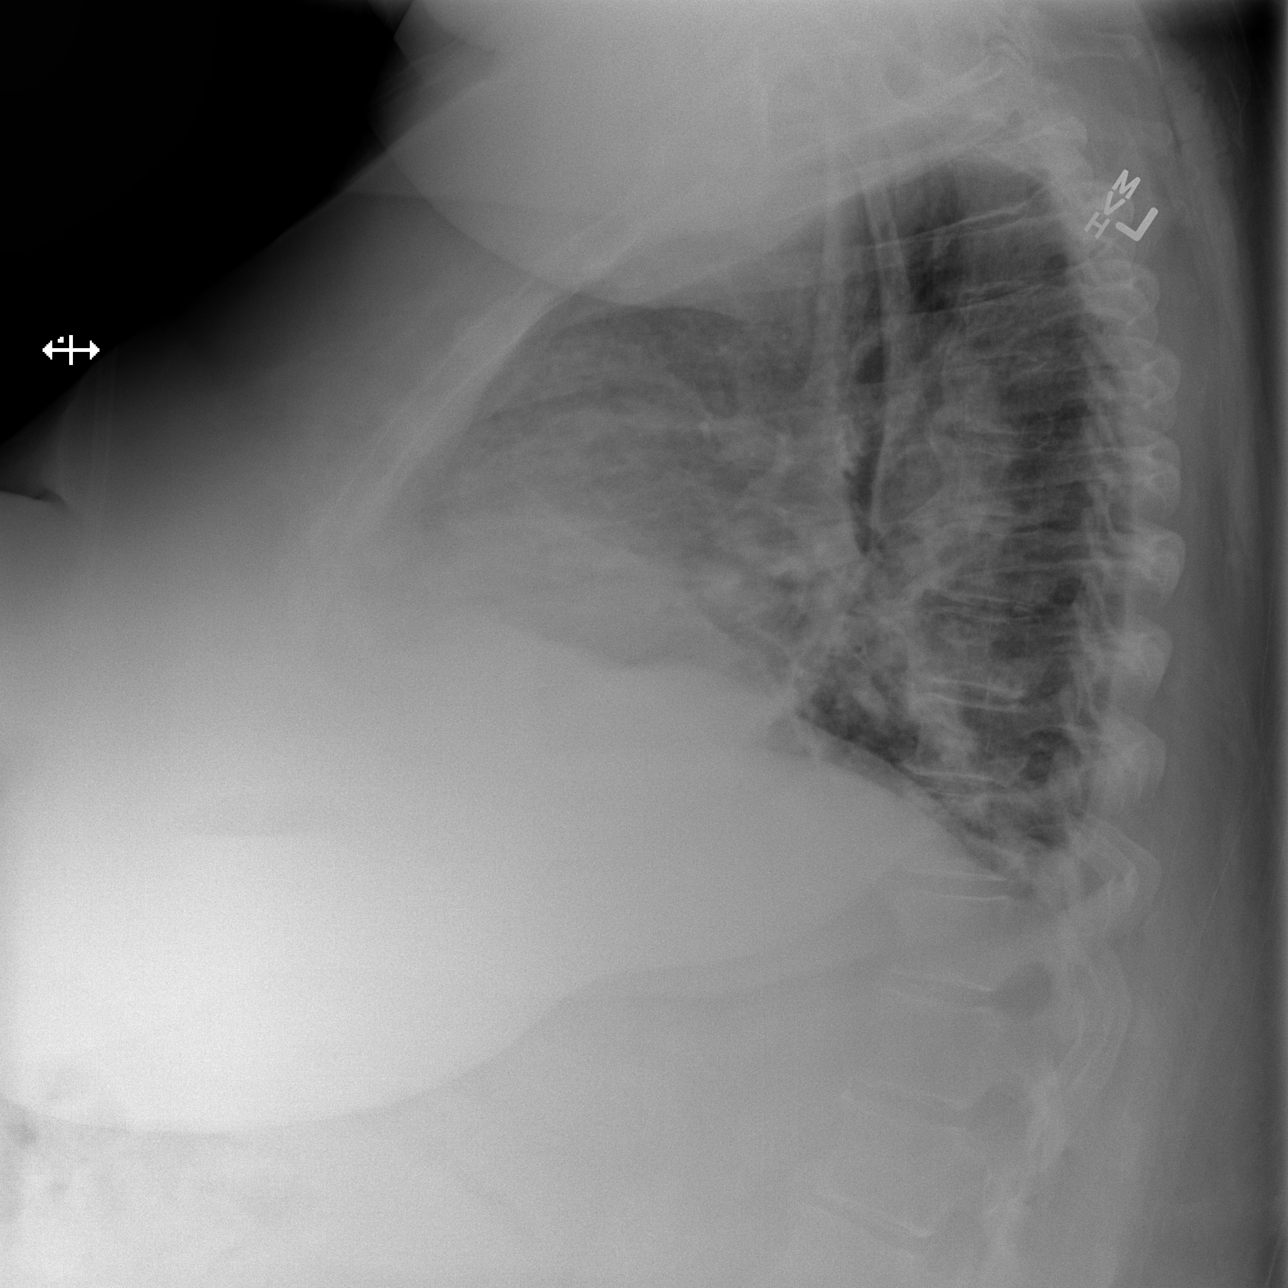

[w chest lat (2 of 2)]
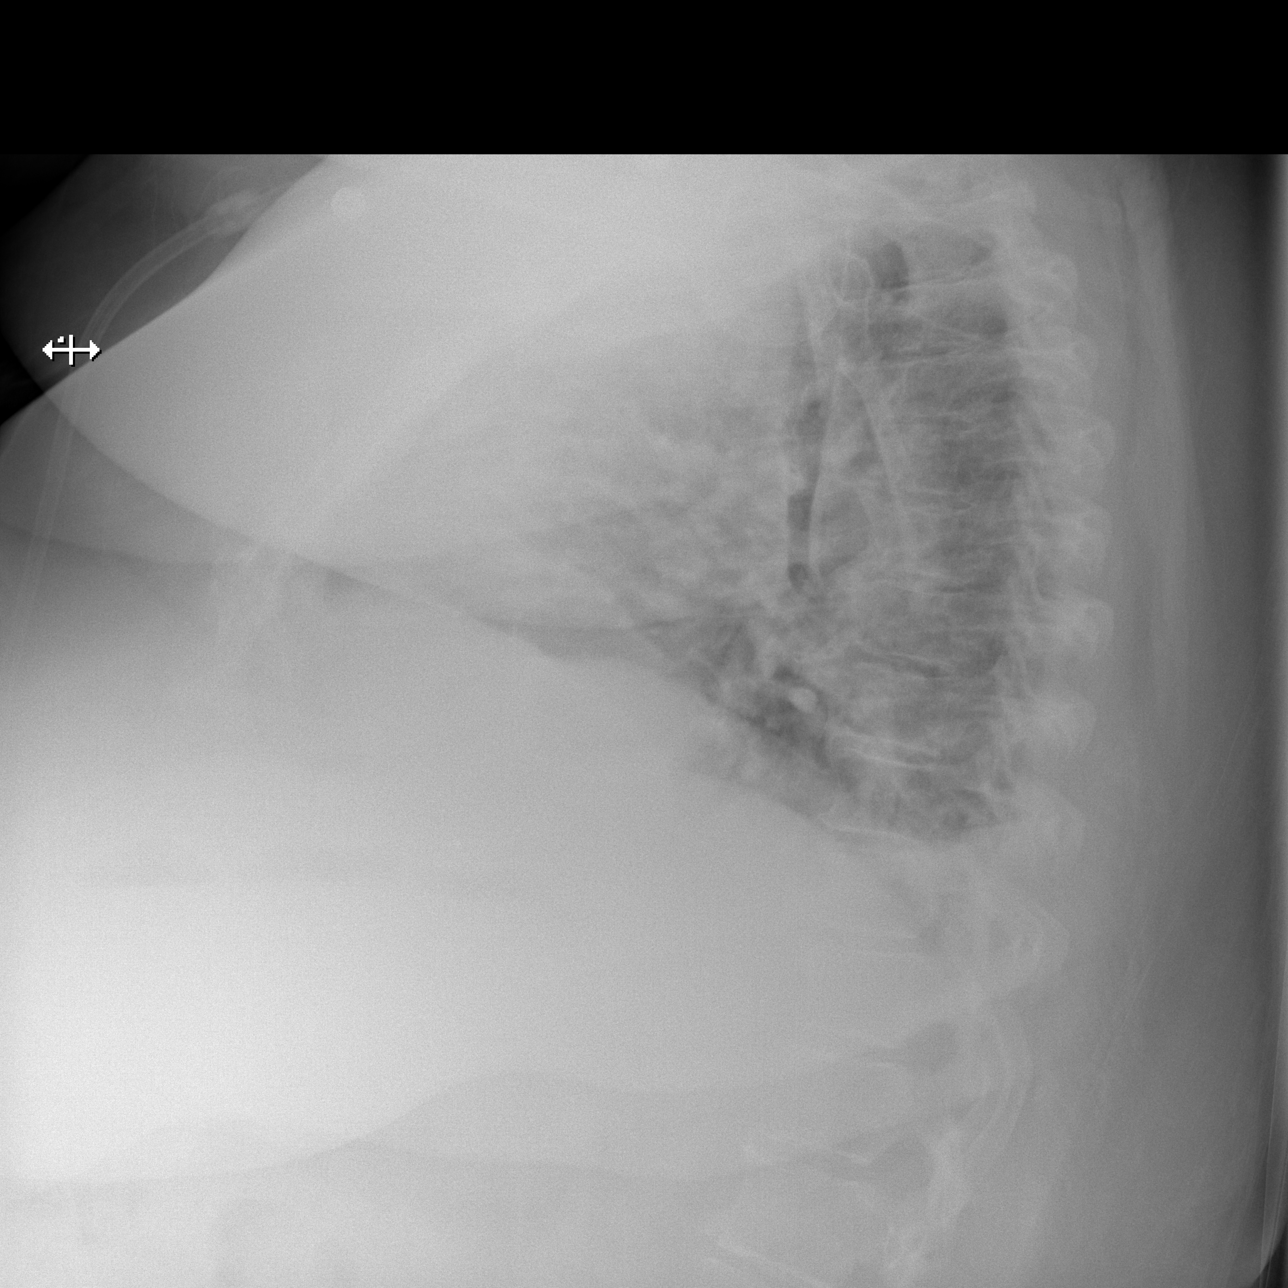

[x chest ap]
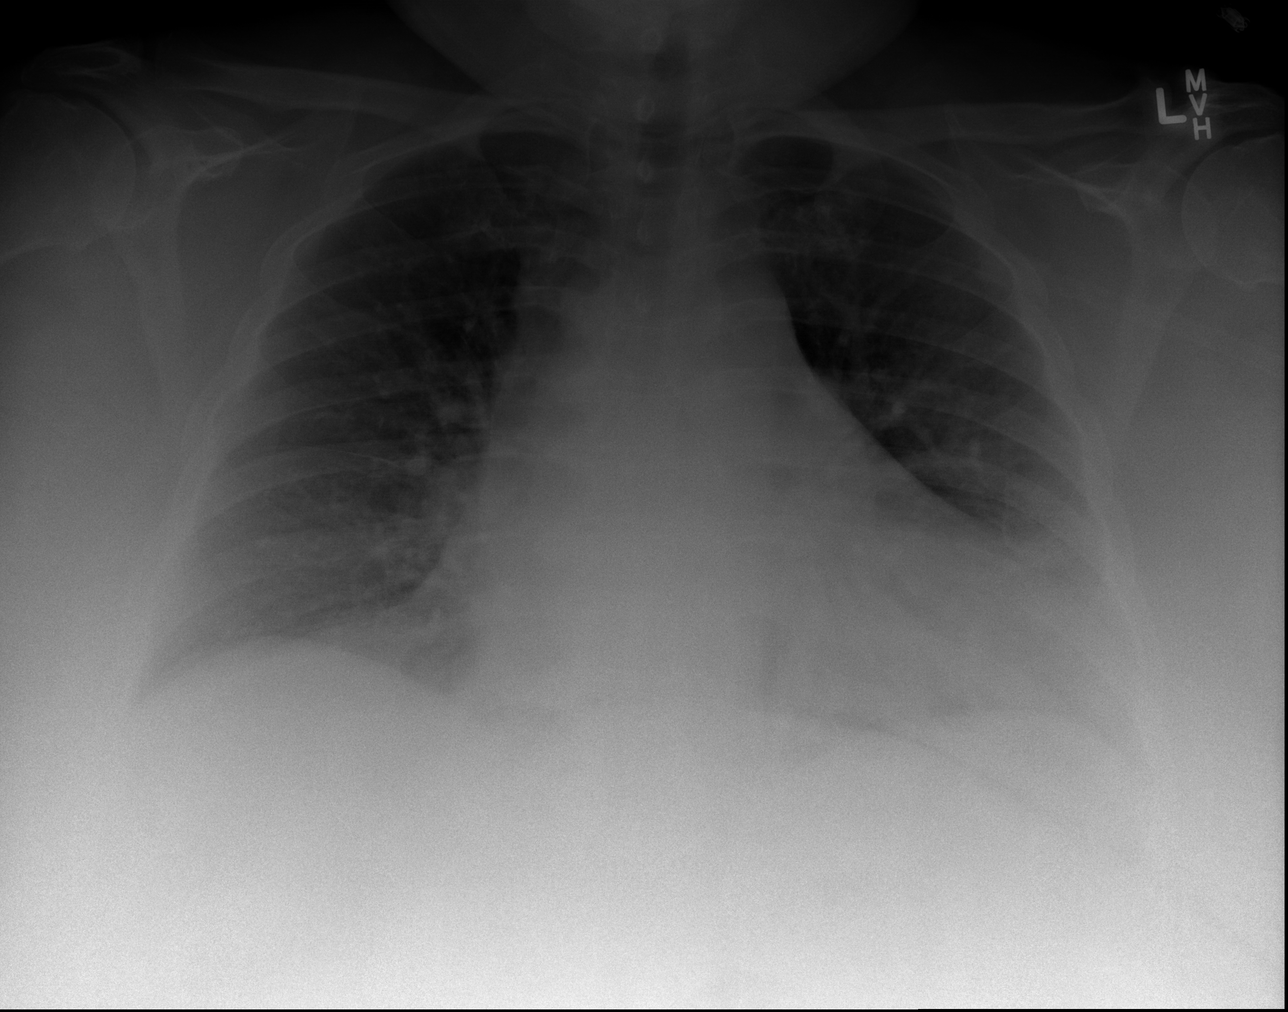

[3 of 3 positions shown; findings below may reference images not displayed]

FINDINGS: The heart size and mediastinal contours are stable. The heart size
is enlarged. There is increased pulmonary interstitium bilaterally.
There is no focal pneumonia or pleural effusion. The visualized
skeletal structures are stable.
IMPRESSION: Persistent congestive heart failure.

## 2017-06-10 IMAGING — CR DG CHEST 2V
2 series · 2 of 2 positions shown · non-contrast
Comparison: 11/21/2014

CLINICAL DATA: Chest pain, productive cough

EXAM:
CHEST  2 VIEW

[chest lat]
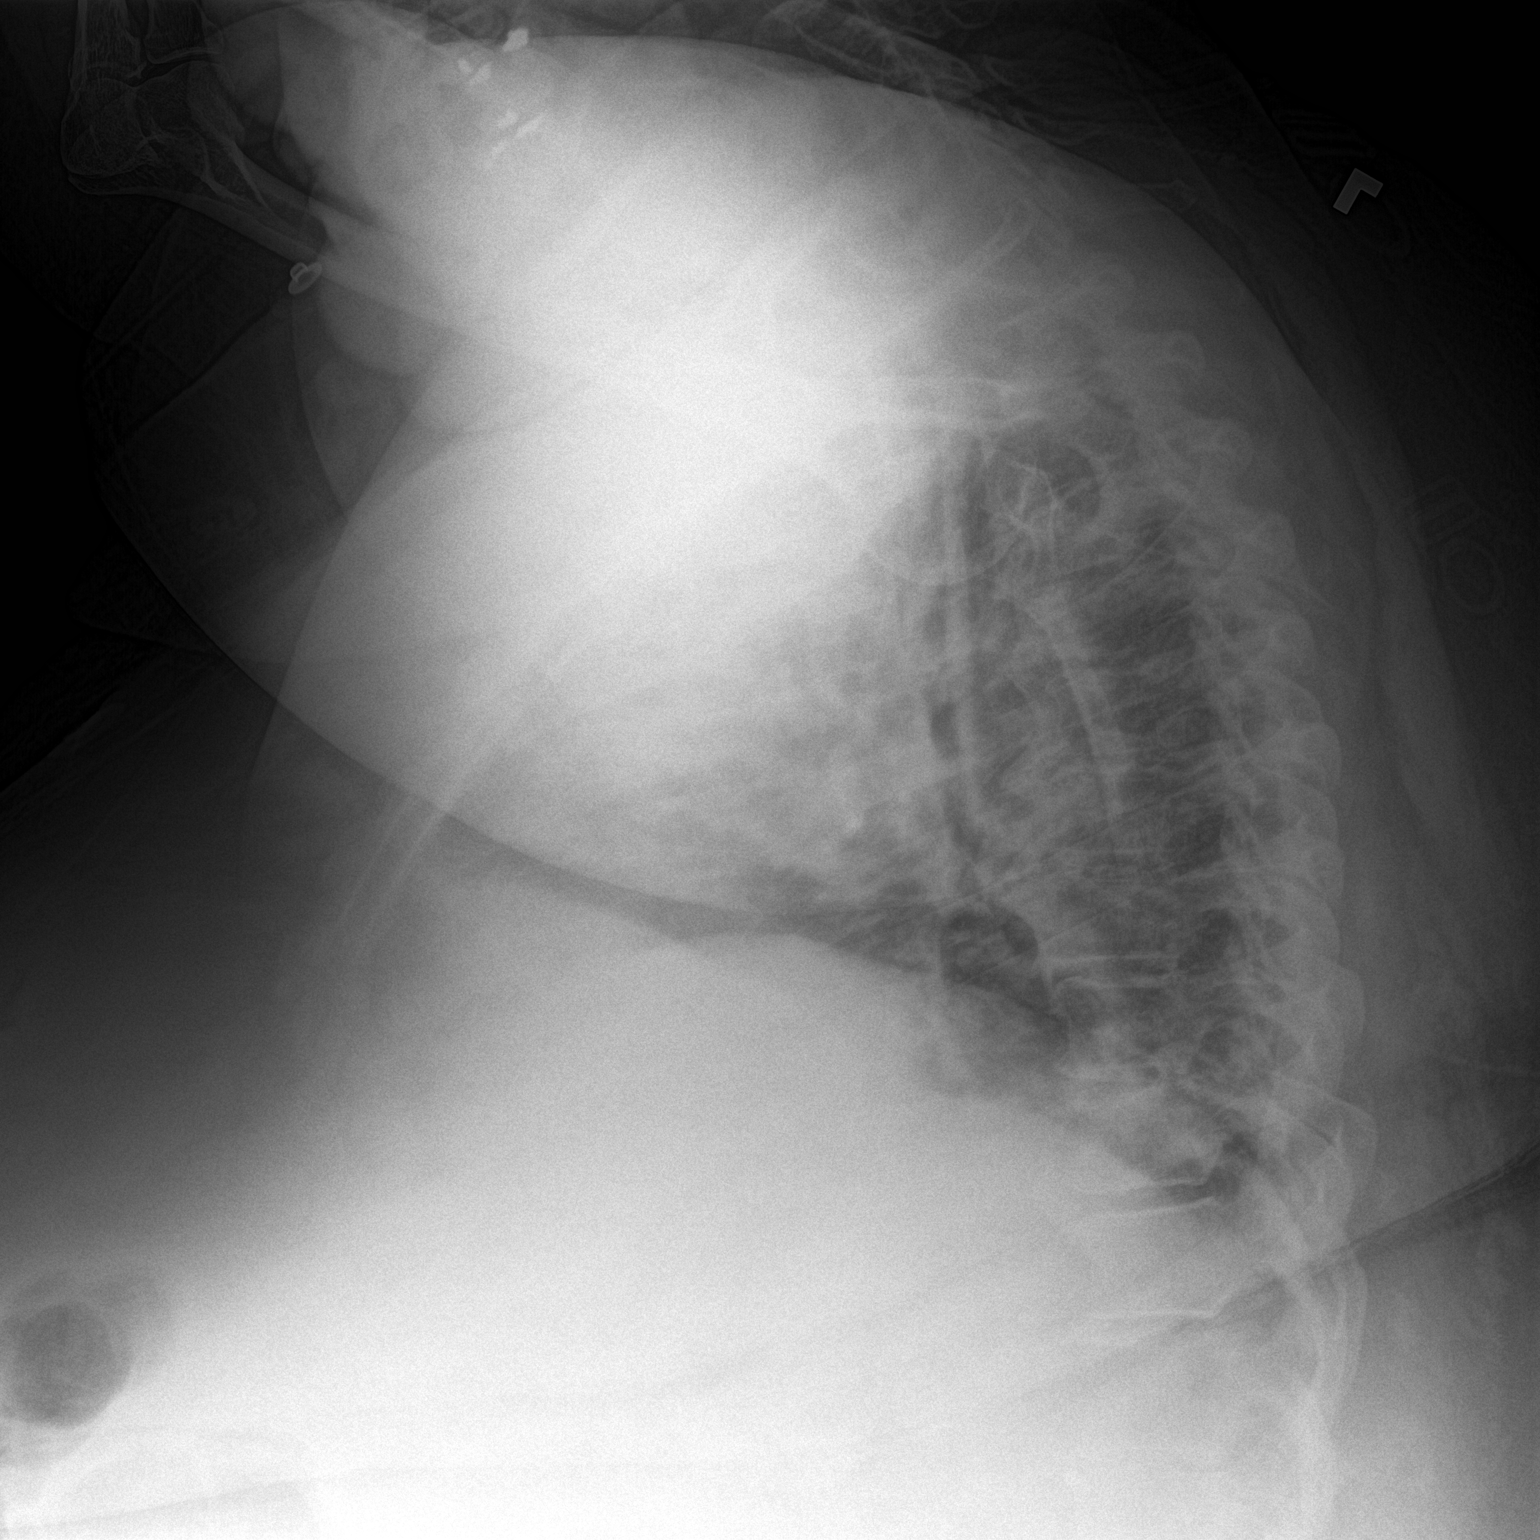

[chest ap]
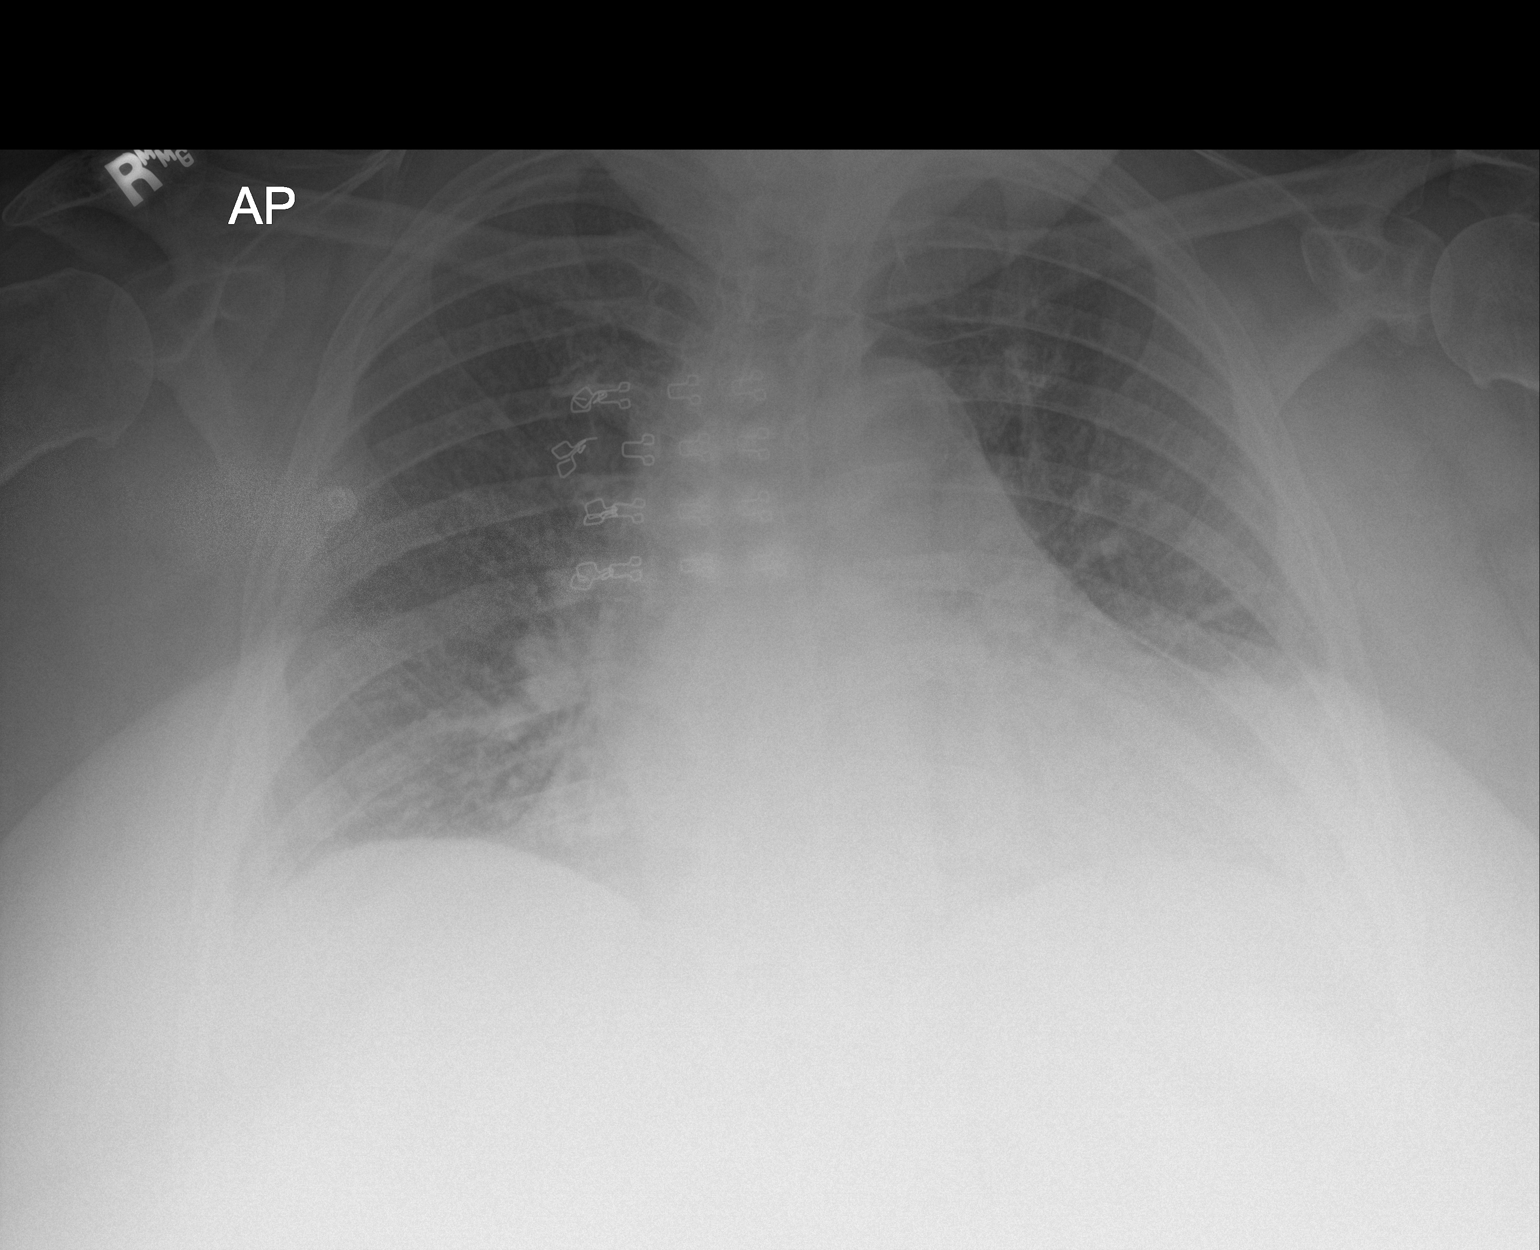

[2 of 2 positions shown; findings below may reference images not displayed]

FINDINGS: Moderate enlargement of the cardiac silhouette reidentified with
central vascular congestion. No overt focal pulmonary opacity. No
pleural effusion. No acute osseous abnormality.
IMPRESSION: Cardiomegaly with central vascular congestion.

## 2017-06-19 IMAGING — CR DG CHEST 2V
2 series · 2 of 2 positions shown · non-contrast
Comparison: Radiograph dated 11/22/2014

CLINICAL DATA: 49-year-old female with cough

EXAM:
CHEST  2 VIEW

[w chest lat]
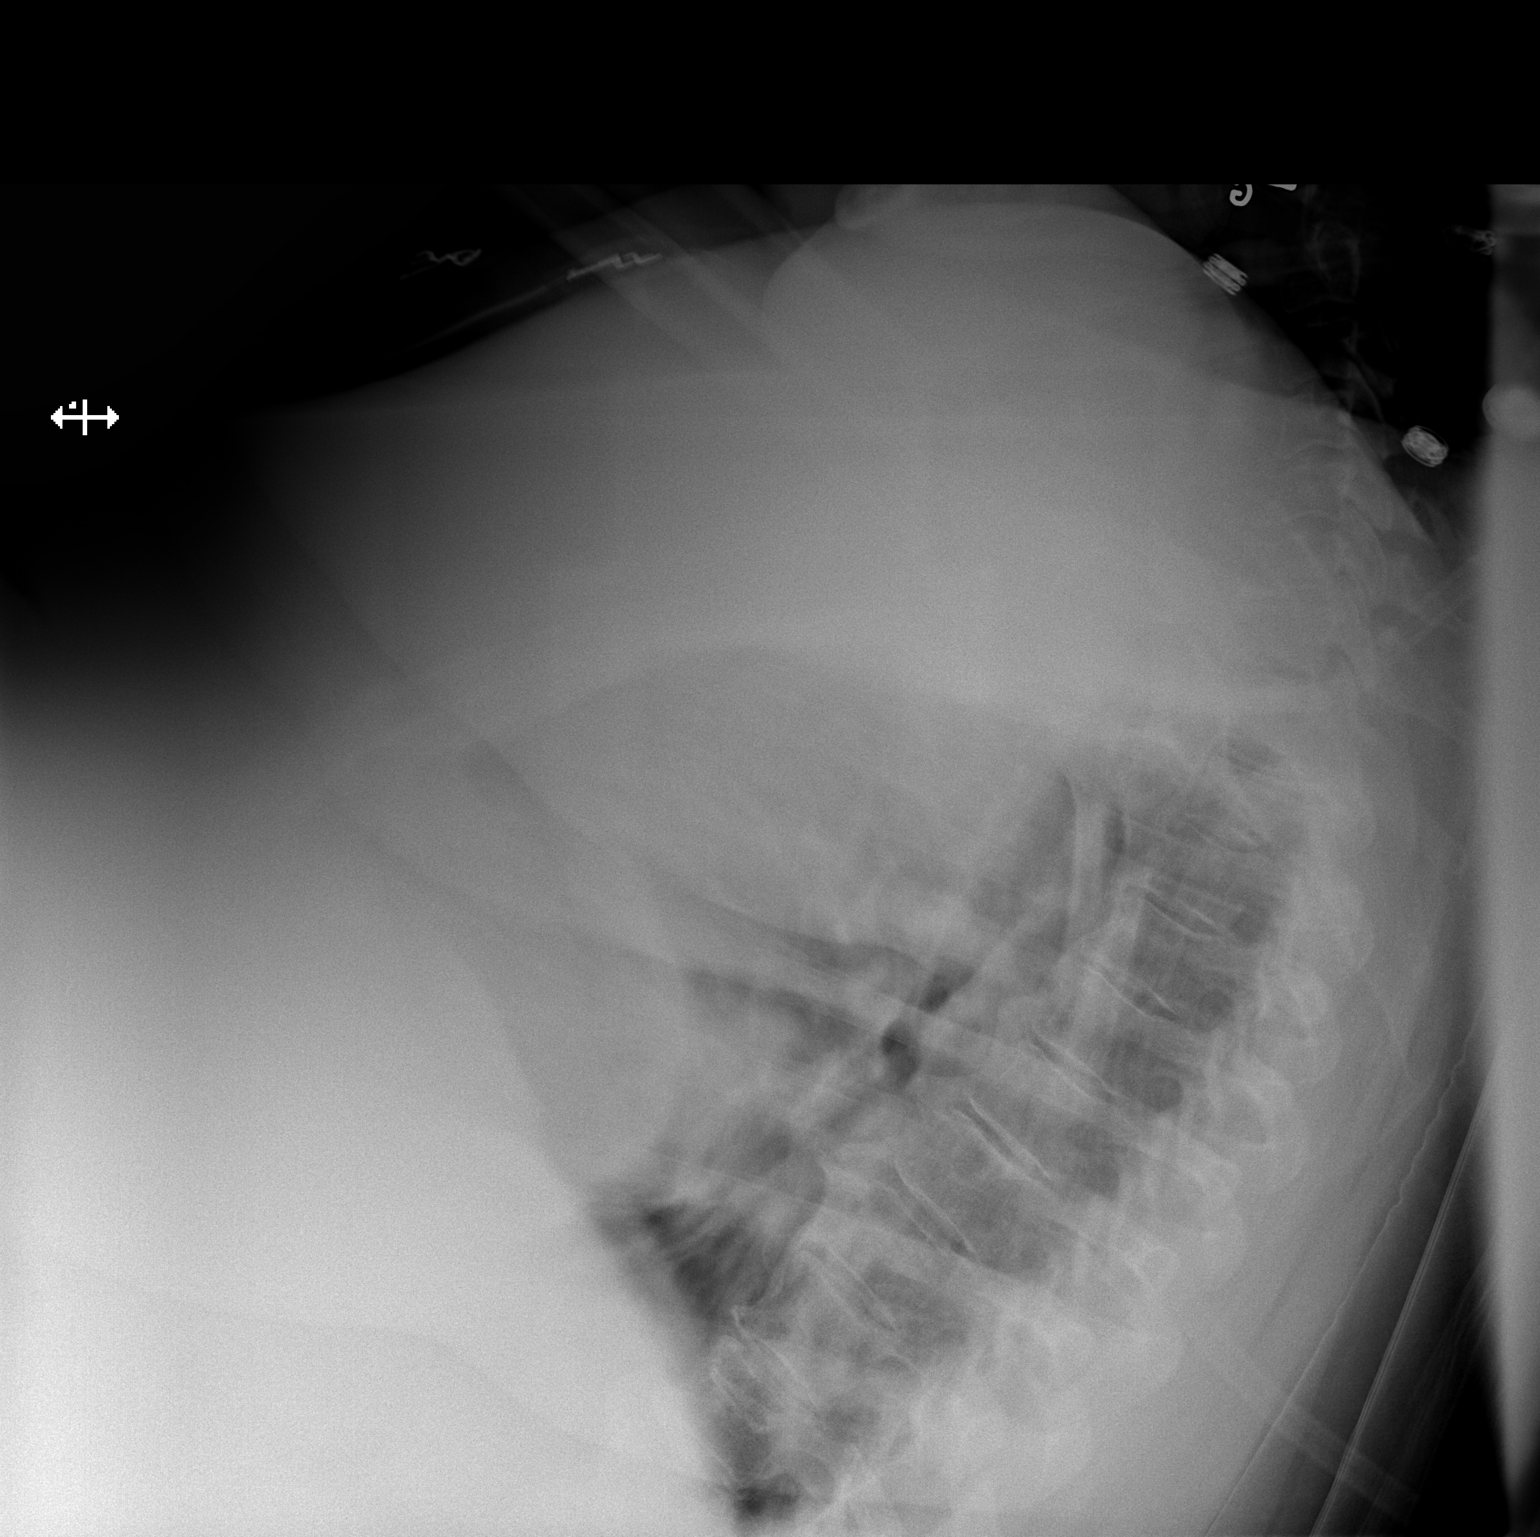

[x chest ap]
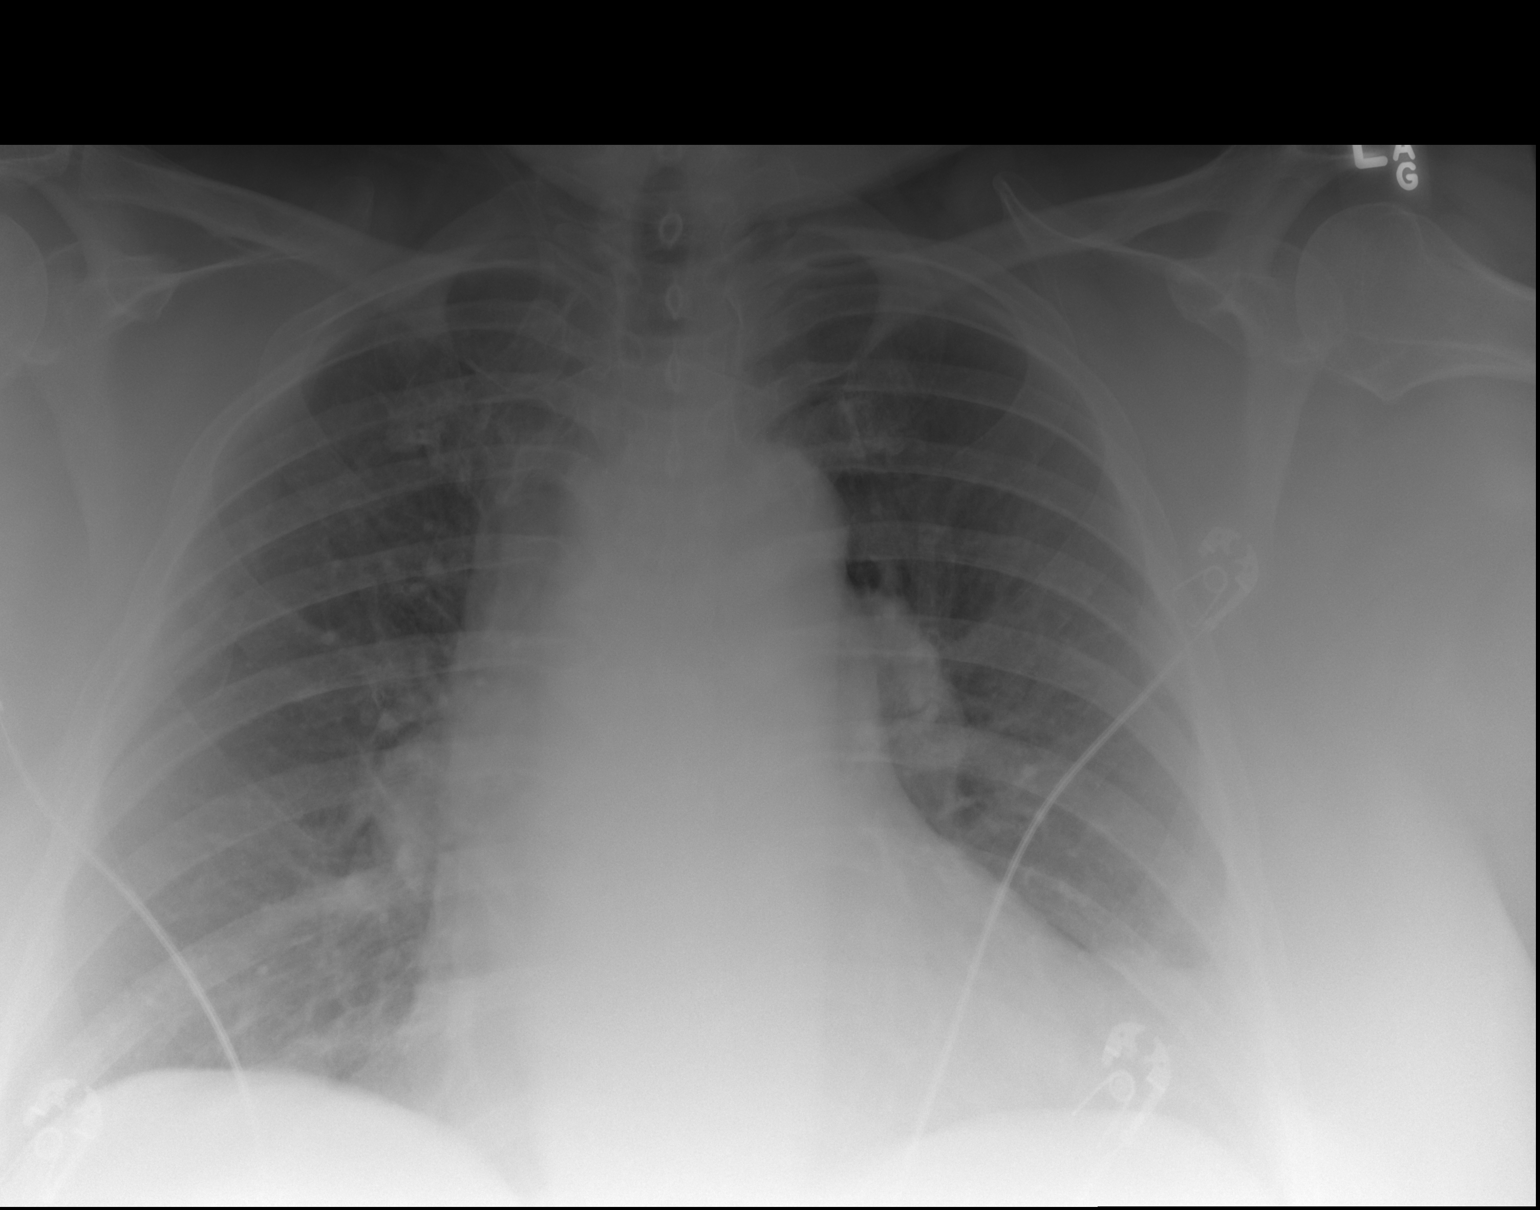

[2 of 2 positions shown; findings below may reference images not displayed]

FINDINGS: Stable cardiomegaly. Both lungs are clear. The visualized skeletal
structures are unremarkable.
IMPRESSION: No active cardiopulmonary disease.

## 2017-06-30 IMAGING — CR DG CHEST 2V
2 series · 2 of 2 positions shown · non-contrast
Comparison: 12/01/2014

CLINICAL DATA: Continue shortness of breath not improving with
inhaler ; former smoker, asthma, hypertension, CHF

EXAM:
CHEST  2 VIEW

[w chest lat]
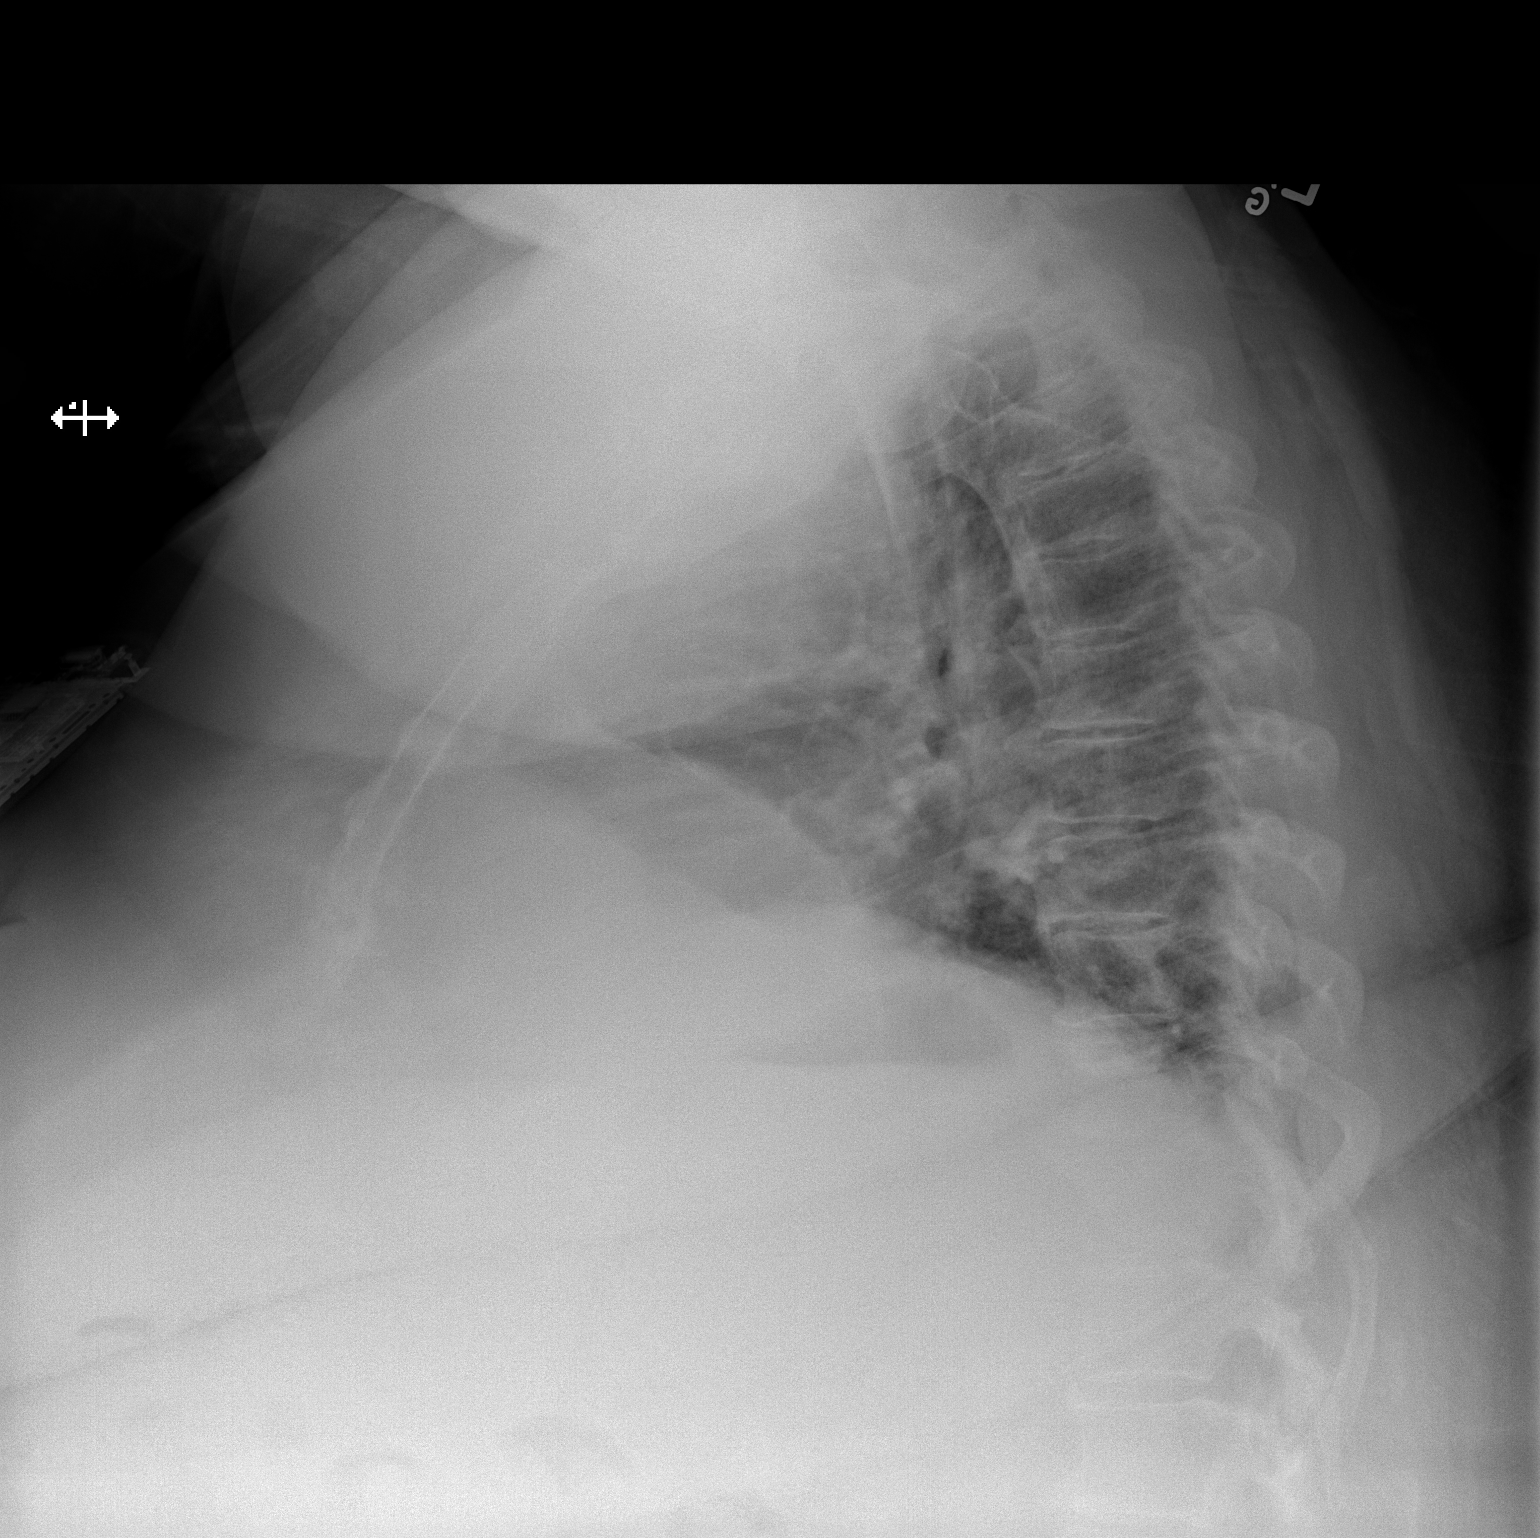

[x chest ap]
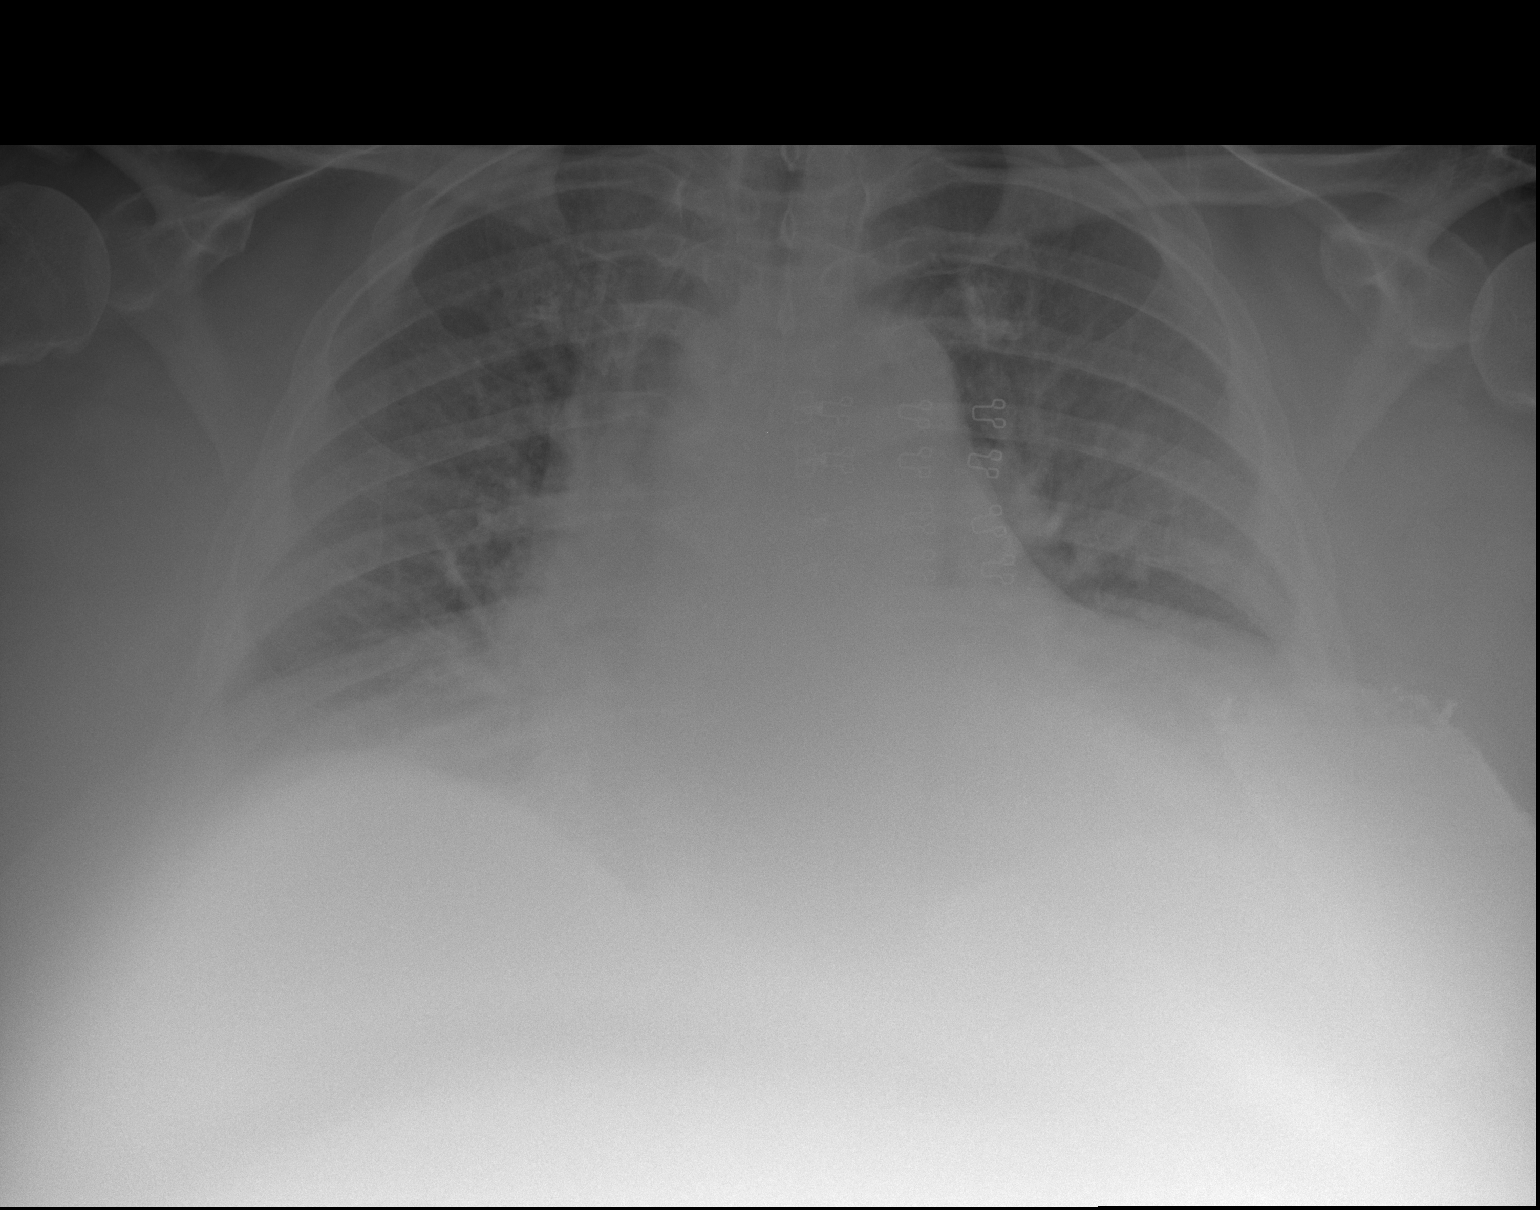

[2 of 2 positions shown; findings below may reference images not displayed]

FINDINGS: Enlargement of cardiac silhouette with pulmonary vascular
congestion.

Minimal perihilar infra compatible pulmonary edema.

Underpenetration at bases.

No gross effusion or pneumothorax.
IMPRESSION: Mild CHF.

## 2017-07-01 IMAGING — CR DG CHEST 2V
2 series · 2 of 2 positions shown · non-contrast
Comparison: 12/12/2014

CLINICAL DATA: Shortness of breath tonight, right-sided chest pain,
leg swelling, asthma, GERD, CHF

EXAM:
CHEST  2 VIEW

[chest lat]
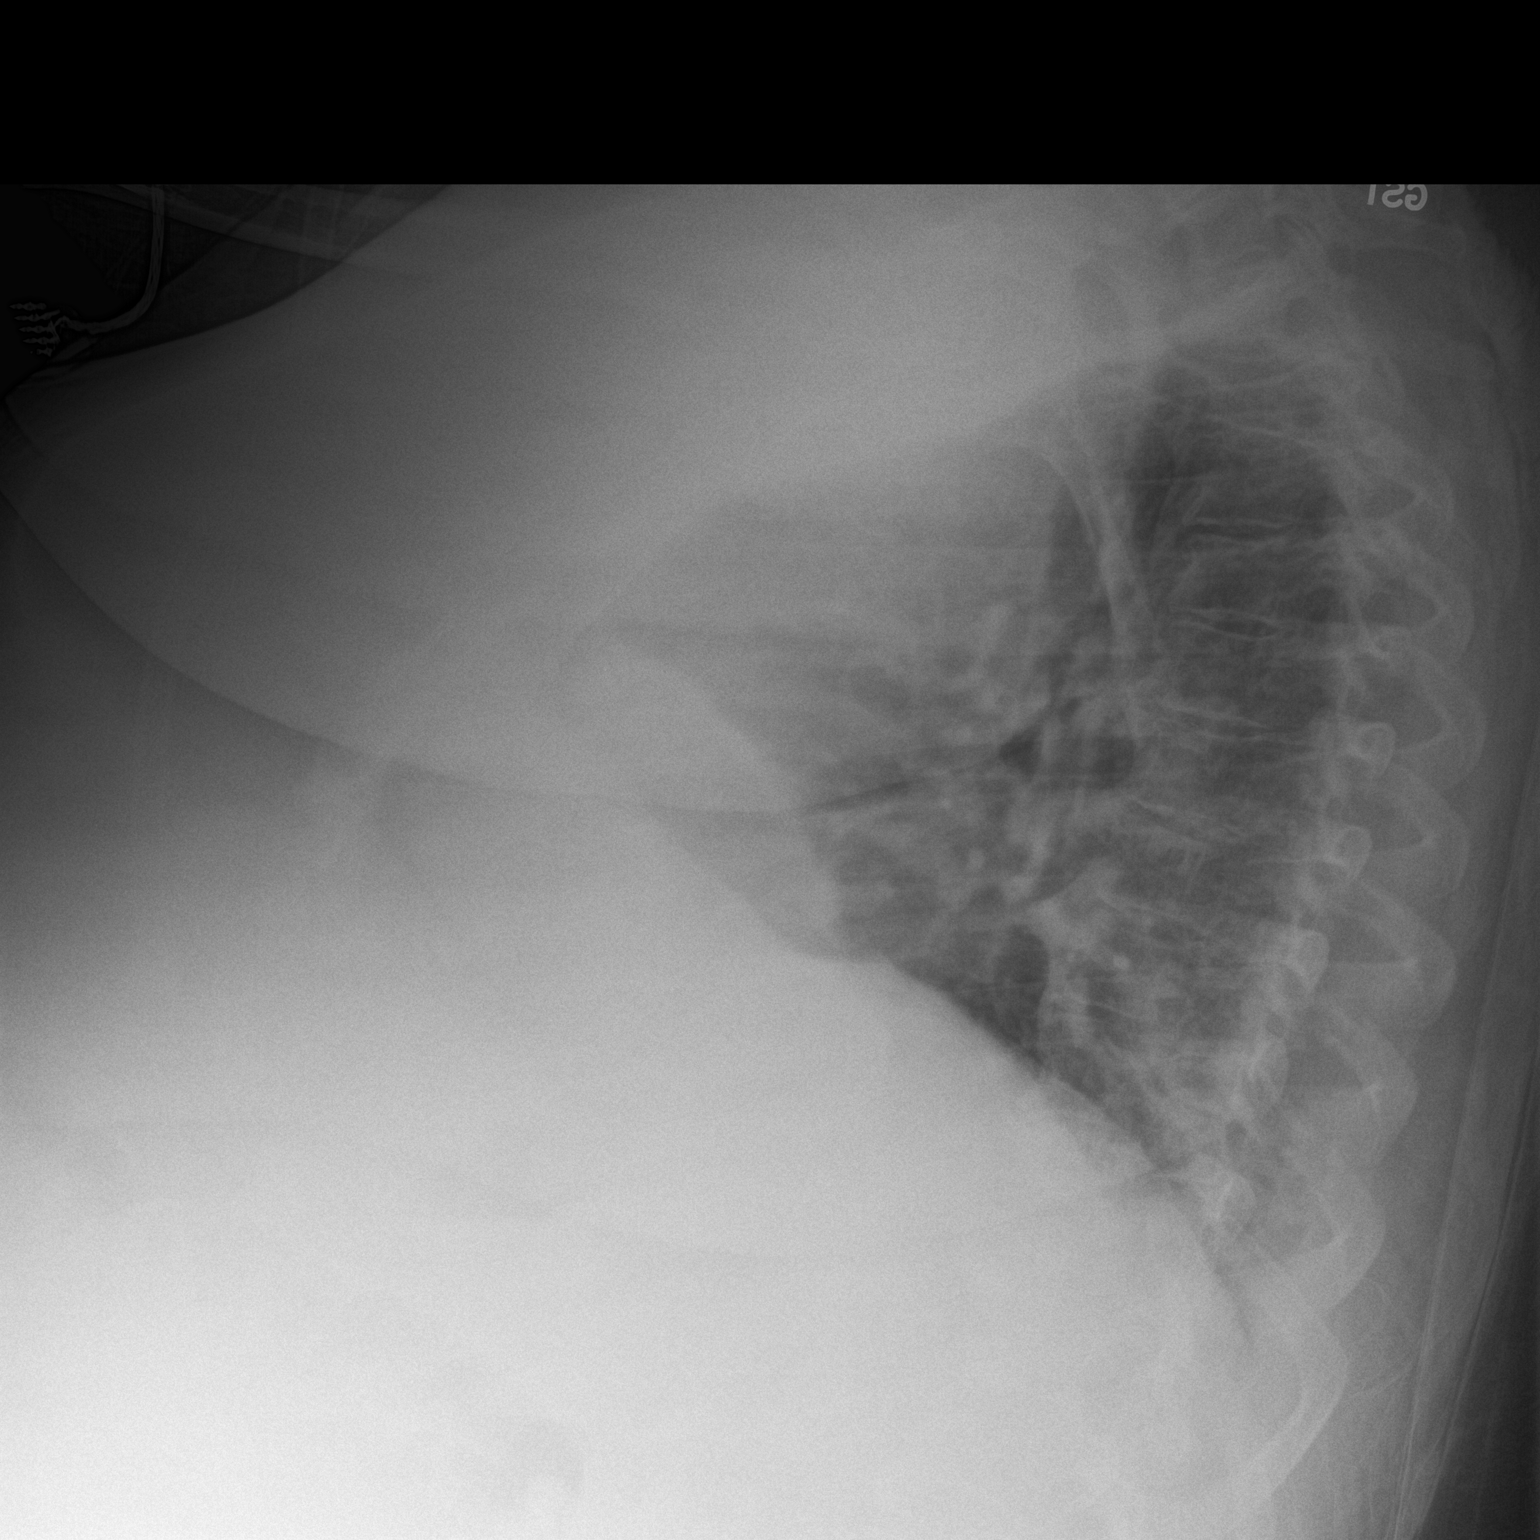

[chest ap]
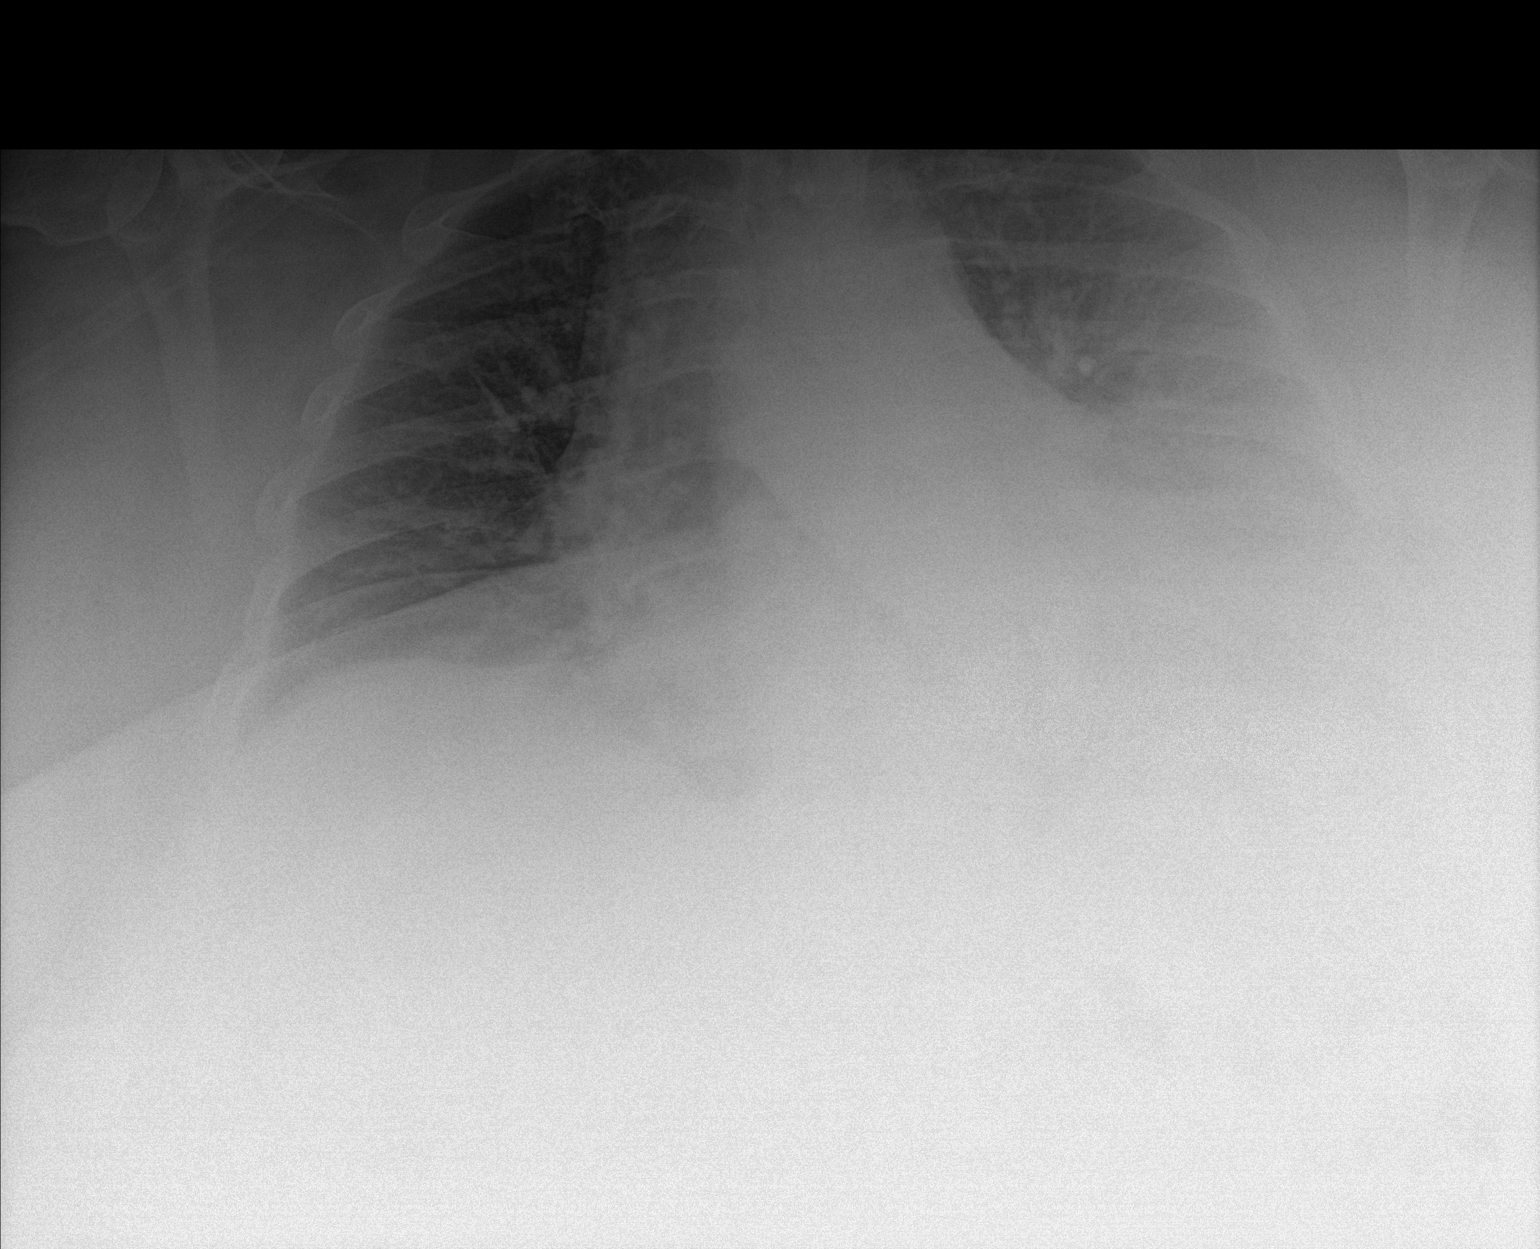

[2 of 2 positions shown; findings below may reference images not displayed]

FINDINGS: Enlargement of cardiac silhouette.

Vascular congestion.

Very low lung volumes.

Underpenetration of lung bases particularly LEFT lower lobe.

Improved perihilar infiltrates versus previous exam.

No gross pneumothorax or pleural effusion.
IMPRESSION: Improved perihilar infiltrate/edema.

Enlargement of cardiac silhouette.

## 2017-07-06 ENCOUNTER — Other Ambulatory Visit: Payer: Self-pay

## 2017-07-06 ENCOUNTER — Encounter (HOSPITAL_COMMUNITY): Payer: Self-pay | Admitting: Emergency Medicine

## 2017-07-06 ENCOUNTER — Emergency Department (HOSPITAL_COMMUNITY): Payer: Medicaid Other

## 2017-07-06 DIAGNOSIS — I130 Hypertensive heart and chronic kidney disease with heart failure and stage 1 through stage 4 chronic kidney disease, or unspecified chronic kidney disease: Principal | ICD-10-CM | POA: Diagnosis present

## 2017-07-06 DIAGNOSIS — I350 Nonrheumatic aortic (valve) stenosis: Secondary | ICD-10-CM | POA: Diagnosis present

## 2017-07-06 DIAGNOSIS — Z6841 Body Mass Index (BMI) 40.0 and over, adult: Secondary | ICD-10-CM

## 2017-07-06 DIAGNOSIS — M1000 Idiopathic gout, unspecified site: Secondary | ICD-10-CM | POA: Diagnosis present

## 2017-07-06 DIAGNOSIS — E785 Hyperlipidemia, unspecified: Secondary | ICD-10-CM | POA: Diagnosis present

## 2017-07-06 DIAGNOSIS — Q828 Other specified congenital malformations of skin: Secondary | ICD-10-CM

## 2017-07-06 DIAGNOSIS — Z881 Allergy status to other antibiotic agents status: Secondary | ICD-10-CM

## 2017-07-06 DIAGNOSIS — Z8249 Family history of ischemic heart disease and other diseases of the circulatory system: Secondary | ICD-10-CM

## 2017-07-06 DIAGNOSIS — E875 Hyperkalemia: Secondary | ICD-10-CM | POA: Diagnosis present

## 2017-07-06 DIAGNOSIS — Z79891 Long term (current) use of opiate analgesic: Secondary | ICD-10-CM

## 2017-07-06 DIAGNOSIS — Z9114 Patient's other noncompliance with medication regimen: Secondary | ICD-10-CM

## 2017-07-06 DIAGNOSIS — Z8614 Personal history of Methicillin resistant Staphylococcus aureus infection: Secondary | ICD-10-CM

## 2017-07-06 DIAGNOSIS — J45909 Unspecified asthma, uncomplicated: Secondary | ICD-10-CM | POA: Diagnosis present

## 2017-07-06 DIAGNOSIS — N179 Acute kidney failure, unspecified: Secondary | ICD-10-CM | POA: Diagnosis not present

## 2017-07-06 DIAGNOSIS — H579 Unspecified disorder of eye and adnexa: Secondary | ICD-10-CM | POA: Diagnosis present

## 2017-07-06 DIAGNOSIS — Z833 Family history of diabetes mellitus: Secondary | ICD-10-CM

## 2017-07-06 DIAGNOSIS — Z9989 Dependence on other enabling machines and devices: Secondary | ICD-10-CM

## 2017-07-06 DIAGNOSIS — Z882 Allergy status to sulfonamides status: Secondary | ICD-10-CM

## 2017-07-06 DIAGNOSIS — Z91048 Other nonmedicinal substance allergy status: Secondary | ICD-10-CM

## 2017-07-06 DIAGNOSIS — I5033 Acute on chronic diastolic (congestive) heart failure: Secondary | ICD-10-CM | POA: Diagnosis present

## 2017-07-06 DIAGNOSIS — M170 Bilateral primary osteoarthritis of knee: Secondary | ICD-10-CM | POA: Diagnosis present

## 2017-07-06 DIAGNOSIS — Z9119 Patient's noncompliance with other medical treatment and regimen: Secondary | ICD-10-CM

## 2017-07-06 DIAGNOSIS — Z880 Allergy status to penicillin: Secondary | ICD-10-CM

## 2017-07-06 DIAGNOSIS — Z825 Family history of asthma and other chronic lower respiratory diseases: Secondary | ICD-10-CM

## 2017-07-06 DIAGNOSIS — Z791 Long term (current) use of non-steroidal anti-inflammatories (NSAID): Secondary | ICD-10-CM

## 2017-07-06 DIAGNOSIS — Z9851 Tubal ligation status: Secondary | ICD-10-CM

## 2017-07-06 DIAGNOSIS — J9621 Acute and chronic respiratory failure with hypoxia: Secondary | ICD-10-CM | POA: Diagnosis present

## 2017-07-06 DIAGNOSIS — Z7951 Long term (current) use of inhaled steroids: Secondary | ICD-10-CM

## 2017-07-06 DIAGNOSIS — J9622 Acute and chronic respiratory failure with hypercapnia: Secondary | ICD-10-CM | POA: Diagnosis present

## 2017-07-06 DIAGNOSIS — Z9111 Patient's noncompliance with dietary regimen: Secondary | ICD-10-CM

## 2017-07-06 DIAGNOSIS — Z886 Allergy status to analgesic agent status: Secondary | ICD-10-CM

## 2017-07-06 DIAGNOSIS — G43909 Migraine, unspecified, not intractable, without status migrainosus: Secondary | ICD-10-CM | POA: Diagnosis present

## 2017-07-06 DIAGNOSIS — N183 Chronic kidney disease, stage 3 (moderate): Secondary | ICD-10-CM | POA: Diagnosis present

## 2017-07-06 DIAGNOSIS — E662 Morbid (severe) obesity with alveolar hypoventilation: Secondary | ICD-10-CM | POA: Diagnosis present

## 2017-07-06 DIAGNOSIS — Z87891 Personal history of nicotine dependence: Secondary | ICD-10-CM

## 2017-07-06 DIAGNOSIS — G894 Chronic pain syndrome: Secondary | ICD-10-CM | POA: Diagnosis present

## 2017-07-06 DIAGNOSIS — Z8261 Family history of arthritis: Secondary | ICD-10-CM

## 2017-07-06 DIAGNOSIS — Z79899 Other long term (current) drug therapy: Secondary | ICD-10-CM

## 2017-07-06 DIAGNOSIS — Z993 Dependence on wheelchair: Secondary | ICD-10-CM

## 2017-07-06 DIAGNOSIS — Z7982 Long term (current) use of aspirin: Secondary | ICD-10-CM

## 2017-07-06 DIAGNOSIS — K219 Gastro-esophageal reflux disease without esophagitis: Secondary | ICD-10-CM | POA: Diagnosis present

## 2017-07-06 DIAGNOSIS — Z823 Family history of stroke: Secondary | ICD-10-CM

## 2017-07-06 DIAGNOSIS — Z9181 History of falling: Secondary | ICD-10-CM

## 2017-07-06 DIAGNOSIS — N3281 Overactive bladder: Secondary | ICD-10-CM | POA: Diagnosis present

## 2017-07-06 DIAGNOSIS — F419 Anxiety disorder, unspecified: Secondary | ICD-10-CM | POA: Diagnosis present

## 2017-07-06 DIAGNOSIS — Z9981 Dependence on supplemental oxygen: Secondary | ICD-10-CM

## 2017-07-06 NOTE — ED Triage Notes (Signed)
Pt from home with c/o SOB, lower leg edema" and headache x 2 days. Pt states she was seen by PCP today for this and was told to come here. Pt states she thinks she is hypotensive due to headache but is not at time of assessment. Pt has clear lung sounds and is able to speak in complete sentences. Pt states she uses o2 all the time at home but not when she goes out. Pt is at 92% RA. Pt also states she would like cysts in her goin evaluated. Pt states she has a cream for them. Pt states they have been present x 1 year

## 2017-07-07 ENCOUNTER — Observation Stay (HOSPITAL_BASED_OUTPATIENT_CLINIC_OR_DEPARTMENT_OTHER)
Admit: 2017-07-07 | Discharge: 2017-07-07 | Disposition: A | Discharge status: Home / Self Care | Payer: Medicaid Other | Attending: Family Medicine | Admitting: Family Medicine

## 2017-07-07 ENCOUNTER — Other Ambulatory Visit: Payer: Self-pay

## 2017-07-07 ENCOUNTER — Encounter (HOSPITAL_COMMUNITY): Payer: Self-pay

## 2017-07-07 ENCOUNTER — Inpatient Hospital Stay (HOSPITAL_COMMUNITY)
Admission: EM | Admit: 2017-07-07 | Discharge: 2017-07-10 | DRG: 291 | Disposition: A | Discharge status: Home Health | Payer: Medicaid Other | Attending: Family Medicine | Admitting: Family Medicine

## 2017-07-07 DIAGNOSIS — R609 Edema, unspecified: Secondary | ICD-10-CM

## 2017-07-07 DIAGNOSIS — M25569 Pain in unspecified knee: Secondary | ICD-10-CM

## 2017-07-07 DIAGNOSIS — I5031 Acute diastolic (congestive) heart failure: Secondary | ICD-10-CM

## 2017-07-07 LAB — URINALYSIS, ROUTINE W REFLEX MICROSCOPIC
Bilirubin Urine: NEGATIVE
GLUCOSE, UA: NEGATIVE mg/dL
Hgb urine dipstick: NEGATIVE
Ketones, ur: NEGATIVE mg/dL
LEUKOCYTES UA: NEGATIVE
NITRITE: NEGATIVE
Protein, ur: NEGATIVE mg/dL
Specific Gravity, Urine: 1.012 (ref 1.005–1.030)
pH: 6 (ref 5.0–8.0)

## 2017-07-07 LAB — RAPID URINE DRUG SCREEN, HOSP PERFORMED
AMPHETAMINES: NOT DETECTED
BENZODIAZEPINES: NOT DETECTED
Barbiturates: NOT DETECTED
Cocaine: NOT DETECTED
OPIATES: POSITIVE — AB
Tetrahydrocannabinol: NOT DETECTED

## 2017-07-07 LAB — HEPATIC FUNCTION PANEL
ALBUMIN: 3.5 g/dL (ref 3.5–5.0)
ALT: 10 U/L — AB (ref 14–54)
AST: 11 U/L — AB (ref 15–41)
Alkaline Phosphatase: 53 U/L (ref 38–126)
TOTAL PROTEIN: 6.8 g/dL (ref 6.5–8.1)
Total Bilirubin: 0.4 mg/dL (ref 0.3–1.2)

## 2017-07-07 LAB — BASIC METABOLIC PANEL
Anion gap: 11 (ref 5–15)
BUN: 37 mg/dL — ABNORMAL HIGH (ref 6–20)
CALCIUM: 8.6 mg/dL — AB (ref 8.9–10.3)
CO2: 28 mmol/L (ref 22–32)
Chloride: 102 mmol/L (ref 101–111)
Creatinine, Ser: 1.85 mg/dL — ABNORMAL HIGH (ref 0.44–1.00)
GFR calc non Af Amer: 30 mL/min — ABNORMAL LOW (ref >60)
GFR, EST AFRICAN AMERICAN: 35 mL/min — AB (ref >60)
Glucose, Bld: 99 mg/dL (ref 65–99)
Potassium: 5.6 mmol/L — ABNORMAL HIGH (ref 3.5–5.1)
SODIUM: 141 mmol/L (ref 135–145)

## 2017-07-07 LAB — MRSA PCR SCREENING: MRSA BY PCR: POSITIVE — AB

## 2017-07-07 LAB — CBC WITH DIFFERENTIAL/PLATELET
BASOS PCT: 0 %
Basophils Absolute: 0 10*3/uL (ref 0.0–0.1)
Eosinophils Absolute: 0.2 10*3/uL (ref 0.0–0.7)
Eosinophils Relative: 3 %
HEMATOCRIT: 40.2 % (ref 36.0–46.0)
Hemoglobin: 12.1 g/dL (ref 12.0–15.0)
Lymphocytes Relative: 30 %
Lymphs Abs: 2.4 10*3/uL (ref 0.7–4.0)
MCH: 30.1 pg (ref 26.0–34.0)
MCHC: 30.1 g/dL (ref 30.0–36.0)
MCV: 100 fL (ref 78.0–100.0)
MONOS PCT: 5 %
Monocytes Absolute: 0.4 10*3/uL (ref 0.1–1.0)
NEUTROS ABS: 5.1 10*3/uL (ref 1.7–7.7)
NEUTROS PCT: 62 %
Platelets: 192 10*3/uL (ref 150–400)
RBC: 4.02 MIL/uL (ref 3.87–5.11)
RDW: 14 % (ref 11.5–15.5)
WBC: 8.1 10*3/uL (ref 4.0–10.5)

## 2017-07-07 LAB — BRAIN NATRIURETIC PEPTIDE: B NATRIURETIC PEPTIDE 5: 13.7 pg/mL (ref 0.0–100.0)

## 2017-07-07 MED ORDER — HYDROCODONE-ACETAMINOPHEN 10-325 MG PO TABS
1.0000 | ORAL_TABLET | ORAL | Status: DC | PRN
  Administered 2017-07-07 – 2017-07-10 (×10): 1 via ORAL
  Filled 2017-07-07 (×10): qty 1

## 2017-07-07 MED ORDER — ISOSORBIDE MONONITRATE ER 30 MG PO TB24
30.0000 mg | ORAL_TABLET | Freq: Every day | ORAL | Status: DC
  Administered 2017-07-07 – 2017-07-09 (×3): 30 mg via ORAL
  Filled 2017-07-07 (×3): qty 1

## 2017-07-07 MED ORDER — DM-GUAIFENESIN ER 30-600 MG PO TB12
1.0000 | ORAL_TABLET | Freq: Two times a day (BID) | ORAL | Status: DC
Start: 2017-07-07 — End: 2017-07-10
  Administered 2017-07-07 – 2017-07-10 (×6): 1 via ORAL
  Filled 2017-07-07 (×6): qty 1

## 2017-07-07 MED ORDER — FOLIC ACID 1 MG PO TABS
1.0000 mg | ORAL_TABLET | Freq: Every day | ORAL | Status: DC
  Administered 2017-07-07 – 2017-07-10 (×4): 1 mg via ORAL
  Filled 2017-07-07 (×4): qty 1

## 2017-07-07 MED ORDER — FUROSEMIDE 10 MG/ML IJ SOLN
40.0000 mg | Freq: Two times a day (BID) | INTRAMUSCULAR | Status: DC
  Administered 2017-07-07 – 2017-07-09 (×4): 40 mg via INTRAVENOUS
  Filled 2017-07-07 (×5): qty 4

## 2017-07-07 MED ORDER — LUBRIDERM SERIOUSLY SENSITIVE EX LOTN
TOPICAL_LOTION | Freq: Two times a day (BID) | CUTANEOUS | Status: DC | PRN
  Filled 2017-07-07 (×2): qty 473

## 2017-07-07 MED ORDER — HYDROXYZINE HCL 25 MG PO TABS
25.0000 mg | ORAL_TABLET | Freq: Three times a day (TID) | ORAL | Status: DC | PRN
  Administered 2017-07-07 – 2017-07-10 (×8): 25 mg via ORAL
  Filled 2017-07-07 (×8): qty 1

## 2017-07-07 MED ORDER — ALBUTEROL SULFATE (2.5 MG/3ML) 0.083% IN NEBU
2.5000 mg | INHALATION_SOLUTION | Freq: Two times a day (BID) | RESPIRATORY_TRACT | Status: DC
Start: 2017-07-08 — End: 2017-07-10
  Administered 2017-07-08 – 2017-07-09 (×4): 2.5 mg via RESPIRATORY_TRACT
  Filled 2017-07-07 (×4): qty 3

## 2017-07-07 MED ORDER — IPRATROPIUM-ALBUTEROL 0.5-2.5 (3) MG/3ML IN SOLN: 3.0000 mL | Freq: Four times a day (QID) | RESPIRATORY_TRACT | Status: DC | PRN

## 2017-07-07 MED ORDER — ACETAMINOPHEN 325 MG PO TABS: 650.0000 mg | ORAL_TABLET | Freq: Four times a day (QID) | ORAL | Status: DC | PRN

## 2017-07-07 MED ORDER — PANTOPRAZOLE SODIUM 40 MG PO TBEC
40.0000 mg | DELAYED_RELEASE_TABLET | Freq: Every morning | ORAL | Status: DC
  Administered 2017-07-08 – 2017-07-10 (×3): 40 mg via ORAL
  Filled 2017-07-07 (×3): qty 1

## 2017-07-07 MED ORDER — ALPRAZOLAM 1 MG PO TABS
1.0000 mg | ORAL_TABLET | Freq: Three times a day (TID) | ORAL | Status: DC | PRN
  Administered 2017-07-07 – 2017-07-08 (×2): 1 mg via ORAL
  Filled 2017-07-07 (×2): qty 1

## 2017-07-07 MED ORDER — AMMONIUM LACTATE 12 % EX LOTN
TOPICAL_LOTION | Freq: Every day | CUTANEOUS | Status: DC | PRN
  Administered 2017-07-08: 14:00:00 via TOPICAL
  Filled 2017-07-07 (×2): qty 225

## 2017-07-07 MED ORDER — HYDROCODONE-ACETAMINOPHEN 5-325 MG PO TABS
1.0000 | ORAL_TABLET | Freq: Once | ORAL | Status: AC
  Administered 2017-07-07: 1 via ORAL
  Filled 2017-07-07: qty 1

## 2017-07-07 MED ORDER — VITAMIN D (ERGOCALCIFEROL) 1.25 MG (50000 UNIT) PO CAPS
50000.0000 [IU] | ORAL_CAPSULE | ORAL | Status: DC
Start: 2017-07-09 — End: 2017-07-10
  Administered 2017-07-09: 50000 [IU] via ORAL
  Filled 2017-07-07: qty 1

## 2017-07-07 MED ORDER — ASPIRIN 325 MG PO TABS
325.0000 mg | ORAL_TABLET | Freq: Every morning | ORAL | Status: DC
  Administered 2017-07-08 – 2017-07-10 (×3): 325 mg via ORAL
  Filled 2017-07-07 (×3): qty 1

## 2017-07-07 MED ORDER — BENZONATATE 100 MG PO CAPS: 100.0000 mg | ORAL_CAPSULE | Freq: Three times a day (TID) | ORAL | Status: DC | PRN

## 2017-07-07 MED ORDER — ALBUTEROL SULFATE (2.5 MG/3ML) 0.083% IN NEBU
3.0000 mL | INHALATION_SOLUTION | Freq: Four times a day (QID) | RESPIRATORY_TRACT | Status: DC | PRN
Start: 2017-07-07 — End: 2017-07-08
  Administered 2017-07-07: 3 mL via RESPIRATORY_TRACT
  Filled 2017-07-07: qty 3

## 2017-07-07 MED ORDER — ENOXAPARIN SODIUM 80 MG/0.8ML ~~LOC~~ SOLN
80.0000 mg | SUBCUTANEOUS | Status: DC
  Filled 2017-07-07 (×2): qty 0.8

## 2017-07-07 MED ORDER — MOMETASONE FURO-FORMOTEROL FUM 200-5 MCG/ACT IN AERO
2.0000 | INHALATION_SPRAY | Freq: Two times a day (BID) | RESPIRATORY_TRACT | Status: DC
  Administered 2017-07-07 – 2017-07-10 (×6): 2 via RESPIRATORY_TRACT
  Filled 2017-07-07: qty 8.8

## 2017-07-07 MED ORDER — NYSTATIN 100000 UNIT/GM EX CREA
TOPICAL_CREAM | Freq: Two times a day (BID) | CUTANEOUS | Status: DC
  Administered 2017-07-07 – 2017-07-10 (×6): via TOPICAL
  Filled 2017-07-07 (×2): qty 15

## 2017-07-07 MED ORDER — DARIFENACIN HYDROBROMIDE ER 7.5 MG PO TB24
7.5000 mg | ORAL_TABLET | Freq: Every day | ORAL | Status: DC
  Administered 2017-07-07 – 2017-07-10 (×4): 7.5 mg via ORAL
  Filled 2017-07-07 (×4): qty 1

## 2017-07-07 MED ORDER — ATORVASTATIN CALCIUM 20 MG PO TABS
20.0000 mg | ORAL_TABLET | Freq: Every morning | ORAL | Status: DC
  Administered 2017-07-08 – 2017-07-10 (×3): 20 mg via ORAL
  Filled 2017-07-07 (×3): qty 1

## 2017-07-07 MED ORDER — ACETAMINOPHEN 650 MG RE SUPP: 650.0000 mg | Freq: Four times a day (QID) | RECTAL | Status: DC | PRN

## 2017-07-07 MED ORDER — GABAPENTIN 100 MG PO CAPS
100.0000 mg | ORAL_CAPSULE | Freq: Two times a day (BID) | ORAL | Status: DC
  Administered 2017-07-07 – 2017-07-10 (×6): 100 mg via ORAL
  Filled 2017-07-07 (×6): qty 1

## 2017-07-07 MED ORDER — ALLOPURINOL 100 MG PO TABS
100.0000 mg | ORAL_TABLET | Freq: Every morning | ORAL | Status: DC
  Administered 2017-07-08 – 2017-07-10 (×3): 100 mg via ORAL
  Filled 2017-07-07 (×3): qty 1

## 2017-07-07 MED ORDER — FUROSEMIDE 40 MG PO TABS
40.0000 mg | ORAL_TABLET | Freq: Once | ORAL | Status: AC
Start: 2017-07-07 — End: 2017-07-07
  Administered 2017-07-07: 40 mg via ORAL
  Filled 2017-07-07: qty 1

## 2017-07-07 NOTE — Progress Notes (Signed)
Patient has meds from home that need to be locked up

## 2017-07-07 NOTE — ED Provider Notes (Signed)
[facility] DEPT Provider Note   CSN: [id number] Arrival date & time: 07/06/17  2126     History   Chief Complaint Chief Complaint  Patient presents with  . Headache  . Leg Swelling  . Shortness of Breath  . Abscess    HPI JUDITHANN VILLAMAR is a 52 y.o. female.  The history is provided by the patient.  Shortness of Breath  This is a recurrent problem. The problem occurs frequently.The current episode started more than 1 week ago. The problem has been gradually worsening. Associated symptoms include headaches, cough, sputum production, chest pain and leg swelling. Pertinent negatives include no fever and no hemoptysis. Treatments tried: home oxygen. The treatment provided no relief. Associated medical issues include heart failure.  Abscess  Associated symptoms: headaches   Associated symptoms: no fever    Presents for multiple complaints.  She has a history of morbid obesity, CHF presents with increasing shortness of breath for the past several weeks.  She also reports weight gain as well as lower extremity edema. She also reports episodes of chest pain.  She also reports cough as well.  She also reports full body pain.  She does report she is feeling weak and dizzy.  She also reports recent hypotension.  She reports she was seen by PCP yesterday and was told to go to ER. She also reports multiple areas of abscesses that she wants me to evaluate Also reports she is having drainage from her left eye. Past Medical History:  Diagnosis Date  . Allergy   . Anxiety   . Arthritis   . Asthma   . Asthma   . CHF (congestive heart failure) ([place])   . CHF (congestive heart failure) ([place])   . Chronic abdominal pain   . Chronic kidney disease   . Chronic pain    "all over"  . 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 GERD (gastroesophageal reflux disease)   . Gout   . Hidradenitis   . HLD (hyperlipidemia) 12/14/2015  . Homelessness   .  Hyperlipemia   . Hypertension   . Low back pain   . Morbid obesity ([place])    uses motor wheel chair  . MRSA (methicillin resistant Staphylococcus aureus)    states about a year ago  . On home O2    3L N/C O2 continuously  . OSA (obstructive sleep apnea)    non-compliant with CPAP  . Oxygen deficiency   . Renal insufficiency   . Sleep apnea     Patient Active Problem List   Diagnosis Date Noted  . Hypoxia 01/22/2017  . Acute pulmonary edema ([place]) 12/14/2015  . Essential hypertension 12/14/2015  . HLD (hyperlipidemia) 12/14/2015  . GERD (gastroesophageal reflux disease) 12/14/2015  . Gout 12/14/2015  . Acute on chronic diastolic (congestive) heart failure ([place]) 12/14/2015  . Chest pain 12/14/2015  . Shortness of breath 06/09/2015  . Atypical chest pain   . Darier's disease   . Leukocytosis   . 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 Acute bronchitis 11/20/2014  . Right-sided heart failure ([place]) 11/20/2014  . OSA (obstructive sleep apnea)   . CKD (chronic kidney disease) stage 3, GFR 30-59 ml/min ([facility]) 11/17/2014  . Asthma exacerbation 11/16/2014  . Venous stasis 11/16/2014  . Acute on chronic respiratory failure with hypoxia ([place]) 11/16/2014  . Hypotension 08/08/2012  . Chronic pain 08/24/2011  . Morbid obesity ([place]) 08/24/2011    Past Surgical History:  Procedure  Laterality Date  . BACK SURGERY    . CESAREAN SECTION    . CESAREAN SECTION    . FRACTURE SURGERY    . TUBAL LIGATION       OB History    Gravida  0   Para  0   Term  0   Preterm  0   AB  0   Living        SAB  0   TAB  0   Ectopic  0   Multiple      Live Births               Home Medications    Prior to Admission medications   Medication Sig Start Date End Date Taking? Authorizing Provider  albuterol (PROVENTIL HFA;VENTOLIN HFA) 108 (90 Base) MCG/ACT inhaler Inhale 1-2 puffs into the lungs every 6 (six) hours as needed for wheezing or shortness of breath.    [provider]    allopurinol (ZYLOPRIM) 100 MG tablet Take 1 tablet (100 mg total) by mouth every morning. 01/22/14   Donne Hazel, MD  ALPRAZolam Duanne Moron) 1 MG tablet Take 1 mg by mouth 3 (three) times daily as needed for anxiety (anxiety).     [provider]  ammonium lactate (AMLACTIN) 12 % cream Apply 1 g topically daily as needed for dry skin. 04/22/14   Linton Flemings, MD  aspirin 325 MG tablet Take 325 mg by mouth every morning.     [provider]  atorvastatin (LIPITOR) 20 MG tablet Take 1 tablet (20 mg total) by mouth every morning. 06/11/15   Lavina Hamman, MD  benzonatate (TESSALON) 100 MG capsule Take 100 mg by mouth 3 (three) times daily as needed for cough.    [provider]  budesonide-formoterol (SYMBICORT) 160-4.5 MCG/ACT inhaler Inhale 2 puffs into the lungs 2 (two) times daily. Patient not taking: Reported on 10/02/2016 11/21/14   Debbe Odea, MD  butalbital-acetaminophen-caffeine (FIORICET, ESGIC) [id number] MG tablet Take 1 tablet by mouth every 6 (six) hours as needed for headache. 06/11/15   Lavina Hamman, MD  dextromethorphan-guaiFENesin [facility] DM) 30-600 MG 12hr tablet Take 1 tablet by mouth 2 (two) times daily. Patient not taking: Reported on 10/02/2016 06/11/15   Lavina Hamman, MD  doxycycline (ADOXA) 100 MG tablet Take 100 mg by mouth 2 (two) times daily.    [provider]  Emollient (EUCERIN) lotion Apply 5 mLs topically 2 (two) times daily as needed for dry skin. 04/22/14   Linton Flemings, MD  folic acid (FOLVITE) 1 MG tablet Take 1 tablet (1 mg total) by mouth daily. Patient not taking: Reported on 05/08/2017 01/22/14   Donne Hazel, MD  furosemide (LASIX) 20 MG tablet Take 1 tablet (20 mg total) by mouth daily. 12/25/15   Hosie Poisson, MD  gabapentin (NEURONTIN) 100 MG capsule Take 1 capsule (100 mg total) by mouth 2 (two) times daily. Patient not taking: Reported on 10/02/2016 06/11/15   Lavina Hamman, MD  HYDROcodone-acetaminophen [facility])  10-325 MG tablet Take 1 tablet by mouth every 4 (four) hours as needed for moderate pain. 12/24/15   Hosie Poisson, MD  hydrOXYzine (ATARAX/VISTARIL) 25 MG tablet Take 25 mg by mouth 3 (three) times daily as needed for anxiety or itching (anxiety and itching).     [provider]  indomethacin (INDOCIN SR) 75 MG CR capsule Take 75 mg by mouth 2 (two) times daily with a meal.    [provider]  ipratropium-albuterol (DUONEB) 0.5-2.5 (3) MG/3ML SOLN Take 3 mLs by nebulization every 6 (six) hours as needed. Patient taking differently: Take 3 mLs by nebulization every 6 (six) hours as needed (SOB).  12/24/15   Hosie Poisson, MD  isosorbide mononitrate (IMDUR) 30 MG 24 hr tablet Take 30 mg by mouth daily. 07/27/15   [provider]  levofloxacin (LEVAQUIN) 500 MG tablet Take 1 tablet (500 mg total) by mouth daily. X 7 days Patient not taking: Reported on 05/08/2017 01/23/17   Patrecia Pour, MD  lisinopril (PRINIVIL,ZESTRIL) 40 MG tablet Hold medication for one week, follow up with PCP , check renal function, if its back to baseline, can start taking it. Patient taking differently: Take 40 mg by mouth daily.  12/24/15   Hosie Poisson, MD  nitrofurantoin, macrocrystal-monohydrate, (MACROBID) 100 MG capsule Take 1 capsule (100 mg total) by mouth 2 (two) times daily. Patient not taking: Reported on 05/08/2017 02/25/17   Kinnie Feil, PA-C  nystatin cream (MYCOSTATIN) Apply to affected area 2 times daily 05/09/17   Larene Pickett, PA-C  Omega-3 Fatty Acids (FISH OIL PO) Take 1 capsule by mouth 2 (two) times daily.    [provider]  pantoprazole (PROTONIX) 40 MG tablet Take 1 tablet (40 mg total) by mouth every morning. 06/11/15   Lavina Hamman, MD  solifenacin (VESICARE) 5 MG tablet Take 5 mg by mouth every morning.     [provider]  Vitamin D, Ergocalciferol, (DRISDOL) 50000 units CAPS capsule Take 50,000 Units by mouth once a week. 01/12/17   [provider]  VITAMIN E PO Take 1 capsule by mouth daily.    [provider]  diphenhydrAMINE (SOMINEX) 25 MG tablet Take 25 mg by mouth at bedtime as needed.  06/14/11  [provider]    Family History Family History  Problem Relation Age of Onset  . Asthma Mother   . Diabetes Father   . Stroke Father   . Heart disease Father   . Arthritis Sister   . Asthma Sister   . Asthma Daughter   . Asthma Son   . Arthritis Sister   . Asthma Sister     Social History Social History   Tobacco Use  . Smoking status: Former Smoker    Types: Cigarettes    Last attempt to quit: 03/25/2003    Years since quitting: 14.2  . Smokeless tobacco: Former Systems developer    Quit date: 05/27/1978  Substance Use Topics  . Alcohol use: Yes    Alcohol/week: 0.0 oz    Comment: years ago  no longer beer only  early 20's  . Drug use: Yes    Types: "Crack" cocaine, Other-see comments    Comment: years  ago     Allergies   Ibuprofen; Penicillins; Sulfa antibiotics; Doxycycline; Ibuprofen; Zithromax [azithromycin]; Adhesive [tape]; Adhesive [tape]; Azithromycin; Doxycycline; Penicillins; Sulfa antibiotics; and Sulfamethoxazole-trimethoprim   Review of Systems Review of Systems  Constitutional: Negative for fever.  Respiratory: Positive for cough, sputum production and shortness of breath. Negative for hemoptysis.   Cardiovascular: Positive for chest pain and leg swelling.  Skin: Positive for wound.  Neurological: Positive for headaches.  All other systems reviewed and are negative.    Physical Exam Updated Vital Signs BP 101/77   Pulse 64   Temp (!) 97.5 F (36.4 C) (Oral)   Resp (!) 21   LMP 03/24/2009   SpO2 97%   Physical Exam CONSTITUTIONAL: Disheveled HEAD: Normocephalic/atraumatic  EYES: EOMI/PERRL, mild amount of discharge noted left eye, no proptosis ENMT: Mucous membranes moist NECK: supple no meningeal signs SPINE/BACK:entire spine nontender CV: S1/S2 noted, limited  due to body size LUNGS: Limited lung exam due to body size, no acute distress ABDOMEN: soft, nontender, obese GU:no cva tenderness NEURO: Pt is awake/alert/appropriate, moves all extremitiesx4.  No facial droop.   EXTREMITIES: pulses normal/equal, full ROM, chronic lower extremity edema noted SKIN: warm, color normal Multiple areas of rash noted to neck groin and bilateral thighs but no abscess or cellulitis noted female nurse chaperone present for entire exam PSYCH: no abnormalities of mood noted, alert and oriented to situation   ED Treatments / Results  Labs (all labs ordered are listed, but only abnormal results are displayed) Labs Reviewed  BASIC METABOLIC PANEL - Abnormal; Notable for the following components:      Result Value   Potassium 5.6 (*)    BUN 37 (*)    Creatinine, Ser 1.85 (*)    Calcium 8.6 (*)    GFR calc non Af Amer 30 (*)    GFR calc Af Amer 35 (*)    All other components within normal limits  CBC WITH DIFFERENTIAL/PLATELET  BRAIN NATRIURETIC PEPTIDE    EKG EKG Interpretation  Date/Time:  Tuesday July 07 2017 05:50:05 EDT Ventricular Rate:  63 PR Interval:    QRS Duration: 105 QT Interval:  431 QTC Calculation: 442 R Axis:   76 Text Interpretation:  Sinus rhythm Abnormal ekg No significant change since last tracing Low voltage, precordial leads Confirmed by Ripley Fraise [phone]) on 07/07/2017 6:20:02 AM Also confirmed by Ripley Fraise [phone]), editor Hattie Perch ([id number])  on 07/07/2017 7:09:46 AM   Radiology Dg Chest 2 View  Result Date: 07/06/2017 CLINICAL DATA:  Short of breath EXAM: CHEST - 2 VIEW COMPARISON:  05/08/2017 FINDINGS: No significant pleural effusion. Cardiomegaly with vascular congestion. No focal consolidation. No pneumothorax. IMPRESSION: Cardiomegaly with vascular congestion. No significant change compared to prior. Electronically Signed   By: Donavan Foil M.D.   On: 07/06/2017 22:48    Procedures Procedures    Medications Ordered in ED Medications  HYDROcodone-acetaminophen (NORCO/VICODIN) 5-325 MG per tablet 1 tablet (has no administration in time range)  furosemide (LASIX) tablet 40 mg (40 mg Oral Given 07/07/17 0814)     Initial Impression / Assessment and Plan / ED Course  I have reviewed the triage vital signs and the nursing notes.  Pertinent labs & imaging results that were available during my care of the patient were reviewed by me and considered in my medical decision making (see chart for details).     Patient with history of morbid obesity and CHF presenting with increasing shortness of breath for the past several weeks.  Her body size makes full evaluation difficult but she does endorse increasing dyspnea on exertion and lower extremity edema.  Her x-ray does have signs of vascular congestion.  She reports she has had good response as an inpatient previously with IV Lasix  she does have some renal dysfunction as well as mild hyperkalemia without EKG changes  Discussed with hospitalist Dr. Florene Glen for admission  Final Clinical Impressions(s) / ED Diagnoses   Final diagnoses:  Acute diastolic congestive heart failure [facility])    ED Discharge Orders    None       Ripley Fraise, MD 07/07/17 [phone]

## 2017-07-07 NOTE — Progress Notes (Signed)
Bilateral lower extremity venous duplex has been completed. Negative for obvious evidence of DVT.  07/07/17 12:44 PM Carlos Levering RVT

## 2017-07-07 NOTE — H&P (Addendum)
History and Physical    Kara Vincent:[id number] DOB: [birth year] DOA: 07/07/2017  PCP: Patient, No Pcp Per  Patient coming from: home  I have personally briefly reviewed patient's old medical records in [place]  Chief Complaint: swelling, difficulty ambulating  HPI: Kara Vincent is a 52 y.o. female with medical history significant of HFpEF, asthma, chronic hypoxic respiratory failure, OHS, OSA and several other chronic medical problems presenting with shortness of breath, lower extremity edema, and difficulty ambulating over the past 2-3 weeks.  She notes that her symptoms started 2-3 weeks ago.  She notes that she noticed progressively worse shortness of breath during that time he was using her inhaler more than often.  She also noticed worsening swelling in her legs.  At baseline she typically walks with a cane, but mostly uses a wheelchair.  Now she feels like she cannot put pressure on her leg because of the swelling.  She notes that her clothes and pants feel tighter.  She denies any fevers.  She notes occasional chills.  She denies chest pain.  He denies nausea or vomiting.  She denies dysuria.  ED Course: Labs, EKG, CXR.  Admit to hospitalist for trial of IV diuresis.  Review of Systems: As per HPI otherwise 10 point review of systems negative.   Past Medical History:  Diagnosis Date  . Allergy   . Anxiety   . Arthritis   . Asthma   . Asthma   . CHF (congestive heart failure) ([place])   . CHF (congestive heart failure) ([place])   . Chronic abdominal pain   . Chronic kidney disease   . Chronic pain    "all over"  . 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 GERD (gastroesophageal reflux disease)   . Gout   . Hidradenitis   . HLD (hyperlipidemia) 12/14/2015  . Homelessness   . Hyperlipemia   . Hypertension   . Low back pain   . Morbid obesity ([place])    uses motor wheel chair  . MRSA (methicillin resistant Staphylococcus aureus)    states about a  year ago  . On home O2    3L N/C O2 continuously  . OSA (obstructive sleep apnea)    non-compliant with CPAP  . Oxygen deficiency   . Renal insufficiency   . Sleep apnea     Past Surgical History:  Procedure Laterality Date  . BACK SURGERY    . CESAREAN SECTION    . CESAREAN SECTION    . FRACTURE SURGERY    . TUBAL LIGATION       reports that she quit smoking about 14 years ago. Her smoking use included cigarettes. She quit smokeless tobacco use about 39 years ago. She reports that she drinks alcohol. She reports that she has current or past drug history. Drugs: "Crack" cocaine and Other-see comments.  Allergies  Allergen Reactions  . Ibuprofen Hives, Shortness Of Breath, Palpitations and Rash    Has tolerated toradol 12/2013 inpatient  . Penicillins Anaphylaxis, Hives and Rash    Has patient had a PCN reaction causing immediate rash, facial/tongue/throat swelling, SOB or lightheadedness with hypotension: Yes Has patient had a PCN reaction causing severe rash involving mucus membranes or skin necrosis: Yes Has patient had a PCN reaction that required hospitalization Unknown Has patient had a PCN reaction occurring within the last 10 years: Unknown If all of the above answers are "NO", then may proceed with Cephalosporin use.   [name]  Sulfa Antibiotics Hives, Shortness Of Breath, Rash and Cough  . Adhesive [Tape] Rash  . Doxycycline Hives, Swelling and Rash  . Sulfamethoxazole-Trimethoprim Rash  . Zithromax [Azithromycin] Hives, Swelling and Rash    Family History  Problem Relation Age of Onset  . Asthma Mother   . Diabetes Father   . Stroke Father   . Heart disease Father   . Arthritis Sister   . Asthma Sister   . Asthma Daughter   . Asthma Son   . Arthritis Sister   . Asthma Sister    Prior to Admission medications   Medication Sig Start Date End Date Taking? Authorizing Provider  albuterol (PROVENTIL HFA;VENTOLIN HFA) 108 (90 Base) MCG/ACT inhaler Inhale 1-2 puffs  into the lungs every 6 (six) hours as needed for wheezing or shortness of breath.   Yes [provider]  allopurinol (ZYLOPRIM) 100 MG tablet Take 1 tablet (100 mg total) by mouth every morning. 01/22/14  Yes Donne Hazel, MD  ALPRAZolam Duanne Moron) 1 MG tablet Take 1 mg by mouth 3 (three) times daily as needed for anxiety (anxiety).    Yes [provider]  ammonium lactate (AMLACTIN) 12 % cream Apply 1 g topically daily as needed for dry skin. 04/22/14  Yes Linton Flemings, MD  aspirin 325 MG tablet Take 325 mg by mouth every morning.    Yes [provider]  atorvastatin (LIPITOR) 20 MG tablet Take 1 tablet (20 mg total) by mouth every morning. 06/11/15  Yes Lavina Hamman, MD  benzonatate (TESSALON) 100 MG capsule Take 100 mg by mouth 3 (three) times daily as needed for cough.   Yes [provider]  budesonide-formoterol (SYMBICORT) 160-4.5 MCG/ACT inhaler Inhale 2 puffs into the lungs 2 (two) times daily. 11/21/14  Yes Debbe Odea, MD  butalbital-acetaminophen-caffeine (FIORICET, ESGIC) [phone] MG tablet Take 1 tablet by mouth every 6 (six) hours as needed for headache. 06/11/15  Yes Lavina Hamman, MD  dextromethorphan-guaiFENesin [facility] DM) 30-600 MG 12hr tablet Take 1 tablet by mouth 2 (two) times daily. 06/11/15  Yes Lavina Hamman, MD  Emollient (EUCERIN) lotion Apply 5 mLs topically 2 (two) times daily as needed for dry skin. 04/22/14  Yes Linton Flemings, MD  folic acid (FOLVITE) 1 MG tablet Take 1 tablet (1 mg total) by mouth daily. 01/22/14  Yes Donne Hazel, MD  furosemide (LASIX) 20 MG tablet Take 1 tablet (20 mg total) by mouth daily. 12/25/15  Yes Hosie Poisson, MD  gabapentin (NEURONTIN) 100 MG capsule Take 1 capsule (100 mg total) by mouth 2 (two) times daily. 06/11/15  Yes Lavina Hamman, MD  HYDROcodone-acetaminophen (NORCO) 10-325 MG tablet Take 1 tablet by mouth every 4 (four) hours as needed for moderate pain. 12/24/15  Yes Hosie Poisson, MD    hydrOXYzine (ATARAX/VISTARIL) 25 MG tablet Take 25 mg by mouth 3 (three) times daily as needed for anxiety or itching (anxiety and itching).    Yes [provider]  indomethacin (INDOCIN SR) 75 MG CR capsule Take 75 mg by mouth 2 (two) times daily with a meal.   Yes [provider]  ipratropium-albuterol (DUONEB) 0.5-2.5 (3) MG/3ML SOLN Take 3 mLs by nebulization every 6 (six) hours as needed. Patient taking differently: Take 3 mLs by nebulization every 6 (six) hours as needed (SOB).  12/24/15  Yes Hosie Poisson, MD  isosorbide mononitrate (IMDUR) 30 MG 24 hr tablet Take 30 mg by mouth daily. 07/27/15  Yes [provider]  ketoconazole (  NIZORAL) 2 % shampoo Apply 1 application topically 2 (two) times a week.   Yes [provider]  ketoconazole (NIZORAL) 200 MG tablet Take 200 mg by mouth daily.   Yes [provider]  lisinopril (PRINIVIL,ZESTRIL) 40 MG tablet Hold medication for one week, follow up with PCP , check renal function, if its back to baseline, can start taking it. Patient taking differently: Take 40 mg by mouth daily.  12/24/15  Yes Hosie Poisson, MD  nystatin cream (MYCOSTATIN) Apply to affected area 2 times daily 05/09/17  Yes Larene Pickett, PA-C  Omega-3 Fatty Acids (FISH OIL PO) Take 1 capsule by mouth 2 (two) times daily.   Yes [provider]  pantoprazole (PROTONIX) 40 MG tablet Take 1 tablet (40 mg total) by mouth every morning. 06/11/15  Yes Lavina Hamman, MD  solifenacin (VESICARE) 5 MG tablet Take 5 mg by mouth every morning.    Yes [provider]  Vitamin D, Ergocalciferol, (DRISDOL) 50000 units CAPS capsule Take 50,000 Units by mouth once a week. 01/12/17  Yes [provider]  doxycycline (ADOXA) 100 MG tablet Take 100 mg by mouth 2 (two) times daily.    [provider]  levofloxacin (LEVAQUIN) 500 MG tablet Take 1 tablet (500 mg total) by mouth daily. X 7 days Patient not taking: Reported on  05/08/2017 01/23/17   Patrecia Pour, MD  nitrofurantoin, macrocrystal-monohydrate, (MACROBID) 100 MG capsule Take 1 capsule (100 mg total) by mouth 2 (two) times daily. Patient not taking: Reported on 05/08/2017 02/25/17   Kinnie Feil, PA-C  diphenhydrAMINE [facility]) 25 MG tablet Take 25 mg by mouth at bedtime as needed.  06/14/11  [provider]    Physical Exam: Vitals:   07/07/17 0353 07/07/17 0601 07/07/17 0855 07/07/17 0859  BP: 109/71 101/77  126/68  Pulse: 70 64  67  Resp: 20 (!) 21  18  Temp: (!) 97.5 F (36.4 C)     TempSrc: Oral     SpO2: 93% 97%  97%  Weight:   (!) 174.9 kg (385 lb 8 oz)     Constitutional: NAD, calm, comfortable, obese Vitals:   07/07/17 0353 07/07/17 0601 07/07/17 0855 07/07/17 0859  BP: 109/71 101/77  126/68  Pulse: 70 64  67  Resp: 20 (!) 21  18  Temp: (!) 97.5 F (36.4 C)     TempSrc: Oral     SpO2: 93% 97%  97%  Weight:   (!) 174.9 kg (385 lb 8 oz)    Eyes: PERRL, lids and conjunctivae normal ENMT: Mucous membranes are moist. Posterior pharynx clear of any exudate or lesions.Normal dentition.  Neck: normal, supple, no masses, no thyromegaly Respiratory: exam limited by body habitus.  clear to auscultation bilaterally, no wheezing, no crackles. Normal respiratory effort. No accessory muscle use.  Cardiovascular: Regular rate and rhythm, no murmurs / rubs / gallops. No carotid bruits.  Abdomen: no tenderness, no masses palpated. No hepatosplenomegaly. Bowel sounds positive.  Musculoskeletal: no clubbing / cyanosis. No joint deformity upper and lower extremities. Good ROM, no contractures. Normal muscle tone. Extremities: legs swollen, but not pitting  Skin: no rashes, lesions, ulcers. No induration Neurologic: CN 2-12 grossly intact. Sensation intact. Strength 5/5 in all 4.  Psychiatric: Normal judgment and insight. Alert and oriented x 3. Normal mood.   Labs on Admission: I have personally reviewed following labs and imaging  studies  CBC: Recent Labs  Lab 07/07/17 0630  WBC 8.1  NEUTROABS  5.1  HGB 12.1  HCT 40.2  MCV 100.0  PLT 782   Basic Metabolic Panel: Recent Labs  Lab 07/07/17 0630  NA 141  K 5.6*  CL 102  CO2 28  GLUCOSE 99  BUN 37*  CREATININE 1.85*  CALCIUM 8.6*   GFR: Estimated Creatinine Clearance: 57.8 mL/min (A) (by C-G formula based on SCr of 1.85 mg/dL (H)). Liver Function Tests: Recent Labs  Lab 07/07/17 0630  AST 11*  ALT 10*  ALKPHOS 53  BILITOT 0.4  PROT 6.8  ALBUMIN 3.5   No results for input(s): LIPASE, AMYLASE in the last 168 hours. No results for input(s): AMMONIA in the last 168 hours. Coagulation Profile: No results for input(s): INR, PROTIME in the last 168 hours. Cardiac Enzymes: No results for input(s): CKTOTAL, CKMB, CKMBINDEX, TROPONINI in the last 168 hours. BNP (last 3 results) No results for input(s): PROBNP in the last 8760 hours. HbA1C: No results for input(s): HGBA1C in the last 72 hours. CBG: No results for input(s): GLUCAP in the last 168 hours. Lipid Profile: No results for input(s): CHOL, HDL, LDLCALC, TRIG, CHOLHDL, LDLDIRECT in the last 72 hours. Thyroid Function Tests: No results for input(s): TSH, T4TOTAL, FREET4, T3FREE, THYROIDAB in the last 72 hours. Anemia Panel: No results for input(s): VITAMINB12, FOLATE, FERRITIN, TIBC, IRON, RETICCTPCT in the last 72 hours. Urine analysis:    Component Value Date/Time   COLORURINE AMBER (A) 05/08/2017 2223   APPEARANCEUR CLOUDY (A) 05/08/2017 2223   LABSPEC 1.019 05/08/2017 2223   PHURINE 5.0 05/08/2017 2223   GLUCOSEU NEGATIVE 05/08/2017 2223   GLUCOSEU NEG mg/dL 10/01/2007 2007   HGBUR NEGATIVE 05/08/2017 2223   HGBUR negative 12/05/2009 1003   BILIRUBINUR NEGATIVE 05/08/2017 2223   BILIRUBINUR small 07/31/2010 1713   KETONESUR NEGATIVE 05/08/2017 2223   PROTEINUR 30 (A) 05/08/2017 2223   UROBILINOGEN 0.2 12/01/2014 2240   NITRITE NEGATIVE 05/08/2017 2223   LEUKOCYTESUR SMALL  (A) 05/08/2017 2223    Radiological Exams on Admission: Dg Chest 2 View  Result Date: 07/06/2017 CLINICAL DATA:  Short of breath EXAM: CHEST - 2 VIEW COMPARISON:  05/08/2017 FINDINGS: No significant pleural effusion. Cardiomegaly with vascular congestion. No focal consolidation. No pneumothorax. IMPRESSION: Cardiomegaly with vascular congestion. No significant change compared to prior. Electronically Signed   By: Donavan Foil M.D.   On: 07/06/2017 22:48    EKG: Independently reviewed. Sinus rhythm.  Appears similar to priors  Assessment/Plan Active Problems:   Swelling  Lower Extremity Edema  Dyspnea on Exertion  Difficulty Ambulating:  With worsening LE edema over past few weeks with DOE and difficulty ambulating due to this.  Hx HFpEF and CXR with vascular congestion, but normal BNP.  She's presented similarly in the past and seemed to improve with IV lasix.  Will check UA for protein.  Normal LFTs.  Trial IV lasix I/O, daily weights Lower extremity US PT   Chronic Hypoxic Resp Failure  OHS  OSA:  Stable. on home O2.    HFpEF: Normal BNP.  Trial of IV lasix as noted above  Hyperkalemia: mild, follow with lasix  CKD stage III: creatinine around baseline.  Follow with trial of diuresis  HTN: continue imdur.  Holding home lasix with IV diuresis.  Hold lisinopril with IV diuresis.   Asthma: continue home and prn nebs  Anxiety: xanax, hydroxyzine  Gout: allopurinol   Morbid Obesity: noted  Chronic Pain: home norco.  Holding home indomethacin, discussed discontinuing this with her CKD (she's resistant to  this)  Overactive Bladder: vesicare  GERD: ppi  DVT prophylaxis: lovenox  Code Status: full  Family Communication: none at bedside  Disposition Plan: pending improvement  Consults called: none  Admission status: obs   Fayrene Helper MD [facility] Pager [phone]  If 7PM-7AM, please contact night-coverage [email or web address] Password [email or web address]  07/07/2017,  9:06 AM

## 2017-07-08 ENCOUNTER — Observation Stay (HOSPITAL_COMMUNITY): Payer: Medicaid Other

## 2017-07-08 DIAGNOSIS — Q828 Other specified congenital malformations of skin: Secondary | ICD-10-CM | POA: Diagnosis not present

## 2017-07-08 DIAGNOSIS — J45909 Unspecified asthma, uncomplicated: Secondary | ICD-10-CM | POA: Diagnosis present

## 2017-07-08 DIAGNOSIS — M1000 Idiopathic gout, unspecified site: Secondary | ICD-10-CM | POA: Diagnosis present

## 2017-07-08 DIAGNOSIS — E662 Morbid (severe) obesity with alveolar hypoventilation: Secondary | ICD-10-CM | POA: Diagnosis present

## 2017-07-08 DIAGNOSIS — N3281 Overactive bladder: Secondary | ICD-10-CM | POA: Diagnosis present

## 2017-07-08 DIAGNOSIS — F419 Anxiety disorder, unspecified: Secondary | ICD-10-CM | POA: Diagnosis present

## 2017-07-08 DIAGNOSIS — I130 Hypertensive heart and chronic kidney disease with heart failure and stage 1 through stage 4 chronic kidney disease, or unspecified chronic kidney disease: Secondary | ICD-10-CM | POA: Diagnosis present

## 2017-07-08 DIAGNOSIS — I509 Heart failure, unspecified: Secondary | ICD-10-CM | POA: Diagnosis not present

## 2017-07-08 DIAGNOSIS — Z993 Dependence on wheelchair: Secondary | ICD-10-CM | POA: Diagnosis not present

## 2017-07-08 DIAGNOSIS — N183 Chronic kidney disease, stage 3 (moderate): Secondary | ICD-10-CM

## 2017-07-08 DIAGNOSIS — G4733 Obstructive sleep apnea (adult) (pediatric): Secondary | ICD-10-CM | POA: Diagnosis not present

## 2017-07-08 DIAGNOSIS — R0602 Shortness of breath: Secondary | ICD-10-CM | POA: Diagnosis present

## 2017-07-08 DIAGNOSIS — I10 Essential (primary) hypertension: Secondary | ICD-10-CM

## 2017-07-08 DIAGNOSIS — R079 Chest pain, unspecified: Secondary | ICD-10-CM | POA: Diagnosis not present

## 2017-07-08 DIAGNOSIS — N179 Acute kidney failure, unspecified: Secondary | ICD-10-CM | POA: Diagnosis not present

## 2017-07-08 DIAGNOSIS — I350 Nonrheumatic aortic (valve) stenosis: Secondary | ICD-10-CM | POA: Diagnosis present

## 2017-07-08 DIAGNOSIS — E875 Hyperkalemia: Secondary | ICD-10-CM | POA: Diagnosis present

## 2017-07-08 DIAGNOSIS — E785 Hyperlipidemia, unspecified: Secondary | ICD-10-CM | POA: Diagnosis present

## 2017-07-08 DIAGNOSIS — I5031 Acute diastolic (congestive) heart failure: Secondary | ICD-10-CM | POA: Diagnosis not present

## 2017-07-08 DIAGNOSIS — J9601 Acute respiratory failure with hypoxia: Secondary | ICD-10-CM | POA: Diagnosis not present

## 2017-07-08 DIAGNOSIS — G43909 Migraine, unspecified, not intractable, without status migrainosus: Secondary | ICD-10-CM | POA: Diagnosis present

## 2017-07-08 DIAGNOSIS — Z9981 Dependence on supplemental oxygen: Secondary | ICD-10-CM | POA: Diagnosis not present

## 2017-07-08 DIAGNOSIS — I5033 Acute on chronic diastolic (congestive) heart failure: Secondary | ICD-10-CM | POA: Diagnosis present

## 2017-07-08 DIAGNOSIS — J9602 Acute respiratory failure with hypercapnia: Secondary | ICD-10-CM | POA: Diagnosis not present

## 2017-07-08 DIAGNOSIS — M170 Bilateral primary osteoarthritis of knee: Secondary | ICD-10-CM | POA: Diagnosis present

## 2017-07-08 DIAGNOSIS — K219 Gastro-esophageal reflux disease without esophagitis: Secondary | ICD-10-CM | POA: Diagnosis present

## 2017-07-08 DIAGNOSIS — J9621 Acute and chronic respiratory failure with hypoxia: Secondary | ICD-10-CM | POA: Diagnosis present

## 2017-07-08 DIAGNOSIS — Z9989 Dependence on other enabling machines and devices: Secondary | ICD-10-CM | POA: Diagnosis not present

## 2017-07-08 DIAGNOSIS — H579 Unspecified disorder of eye and adnexa: Secondary | ICD-10-CM | POA: Diagnosis present

## 2017-07-08 DIAGNOSIS — J9611 Chronic respiratory failure with hypoxia: Secondary | ICD-10-CM | POA: Diagnosis not present

## 2017-07-08 DIAGNOSIS — G894 Chronic pain syndrome: Secondary | ICD-10-CM | POA: Diagnosis present

## 2017-07-08 DIAGNOSIS — R55 Syncope and collapse: Secondary | ICD-10-CM | POA: Diagnosis not present

## 2017-07-08 DIAGNOSIS — J9622 Acute and chronic respiratory failure with hypercapnia: Secondary | ICD-10-CM | POA: Diagnosis present

## 2017-07-08 DIAGNOSIS — Z6841 Body Mass Index (BMI) 40.0 and over, adult: Secondary | ICD-10-CM | POA: Diagnosis not present

## 2017-07-08 LAB — COMPREHENSIVE METABOLIC PANEL
ALBUMIN: 3.5 g/dL (ref 3.5–5.0)
ALK PHOS: 55 U/L (ref 38–126)
ALT: 11 U/L — AB (ref 14–54)
AST: 11 U/L — AB (ref 15–41)
Anion gap: 12 (ref 5–15)
BILIRUBIN TOTAL: 0.7 mg/dL (ref 0.3–1.2)
BUN: 35 mg/dL — AB (ref 6–20)
CALCIUM: 8.6 mg/dL — AB (ref 8.9–10.3)
CO2: 30 mmol/L (ref 22–32)
CREATININE: 1.78 mg/dL — AB (ref 0.44–1.00)
Chloride: 99 mmol/L — ABNORMAL LOW (ref 101–111)
GFR calc Af Amer: 36 mL/min — ABNORMAL LOW (ref >60)
GFR, EST NON AFRICAN AMERICAN: 31 mL/min — AB (ref >60)
GLUCOSE: 114 mg/dL — AB (ref 65–99)
POTASSIUM: 5.3 mmol/L — AB (ref 3.5–5.1)
Sodium: 141 mmol/L (ref 135–145)
TOTAL PROTEIN: 6.8 g/dL (ref 6.5–8.1)

## 2017-07-08 LAB — CBC
HEMATOCRIT: 41.3 % (ref 36.0–46.0)
Hemoglobin: 12.3 g/dL (ref 12.0–15.0)
MCH: 29.8 pg (ref 26.0–34.0)
MCHC: 29.8 g/dL — AB (ref 30.0–36.0)
MCV: 100 fL (ref 78.0–100.0)
PLATELETS: 210 10*3/uL (ref 150–400)
RBC: 4.13 MIL/uL (ref 3.87–5.11)
RDW: 14.2 % (ref 11.5–15.5)
WBC: 8.9 10*3/uL (ref 4.0–10.5)

## 2017-07-08 MED ORDER — MUPIROCIN 2 % EX OINT
1.0000 "application " | TOPICAL_OINTMENT | Freq: Two times a day (BID) | CUTANEOUS | Status: DC
  Administered 2017-07-08 – 2017-07-10 (×5): 1 via NASAL
  Filled 2017-07-08 (×2): qty 22

## 2017-07-08 MED ORDER — EUCERIN EX LOTN: 5.0000 mL | TOPICAL_LOTION | Freq: Two times a day (BID) | CUTANEOUS | 0 refills | Status: DC | PRN

## 2017-07-08 MED ORDER — ALBUTEROL SULFATE (2.5 MG/3ML) 0.083% IN NEBU: 3.0000 mL | INHALATION_SOLUTION | Freq: Four times a day (QID) | RESPIRATORY_TRACT | Status: DC | PRN

## 2017-07-08 MED ORDER — SOLIFENACIN SUCCINATE 5 MG PO TABS: 5.0000 mg | ORAL_TABLET | Freq: Every morning | ORAL | 0 refills | Status: DC

## 2017-07-08 MED ORDER — VITAMIN D (ERGOCALCIFEROL) 1.25 MG (50000 UNIT) PO CAPS: 50000.0000 [IU] | ORAL_CAPSULE | ORAL | 0 refills | Status: DC

## 2017-07-08 MED ORDER — ALPRAZOLAM 1 MG PO TABS
1.0000 mg | ORAL_TABLET | Freq: Three times a day (TID) | ORAL | Status: DC | PRN
  Administered 2017-07-08 – 2017-07-10 (×5): 1 mg via ORAL
  Filled 2017-07-08 (×4): qty 1
  Filled 2017-07-08: qty 2

## 2017-07-08 MED ORDER — HYDROCERIN EX CREA
TOPICAL_CREAM | CUTANEOUS | Status: DC | PRN
Start: 2017-07-08 — End: 2017-07-10
  Administered 2017-07-08: 14:00:00 via TOPICAL
  Filled 2017-07-08: qty 113

## 2017-07-08 MED ORDER — CHLORHEXIDINE GLUCONATE CLOTH 2 % EX PADS
6.0000 | MEDICATED_PAD | Freq: Every day | CUTANEOUS | Status: DC
  Administered 2017-07-08 – 2017-07-09 (×2): 6 via TOPICAL

## 2017-07-08 MED ORDER — GABAPENTIN 100 MG PO CAPS: 100.0000 mg | ORAL_CAPSULE | Freq: Two times a day (BID) | ORAL | 0 refills | Status: DC

## 2017-07-08 MED ORDER — LUBRIDERM SERIOUSLY SENSITIVE EX LOTN
TOPICAL_LOTION | Freq: Two times a day (BID) | CUTANEOUS | Status: DC | PRN
Start: 2017-07-08 — End: 2017-07-10
  Administered 2017-07-08: 472 via TOPICAL

## 2017-07-08 MED ORDER — FUROSEMIDE 40 MG PO TABS: 40.0000 mg | ORAL_TABLET | Freq: Two times a day (BID) | ORAL | 0 refills | Status: DC

## 2017-07-08 NOTE — Progress Notes (Signed)
PROGRESS NOTE [facility]   Kara Vincent   KWI:[id number] DOB: 1965-07-31  DOA: 07/07/2017 PCP: Patient, No Pcp Per   Brief Narrative:  Kara Vincent is a 52 year old female with medical history significant for HFpEF, chronic hypoxic respiratory failure oxygen dependent, OHS/OSA, asthma and CKD stage III.  Patient presented to the emergency department complaining of shortness of breath, lower extremity edema and difficulty ambulating for the past 3 weeks.  Patient uses wheelchair at baseline and sometimes walk with a cane.  Upon ED evaluation chest x-ray shows cardiomegaly with vascular congestion, BNP 13, creatinine 1.78 mild elevated potassium to 5.6.  Patient was admitted with working diagnosis of diastolic CHF exacerbation and was started on IV diuresis.  Patient had good urine output and oxygen requirement remained stable.  Patient was complaining of right knee pain and swelling for which x-ray was done showing chronic osteoarthritis that is getting worse.   Subjective: Kara Vincent seen and examined, good diuresis.  Complaining of slight headache.  Blood pressure slightly low.  Assessment & Plan: Acute on chronic diastolic CHF (HFpEF) Normal BNP due to obesity, chest x-ray with increasing vascular congestion and cardiomegaly, lower extremity edema and SOB.  Patient had good diuresis after Lasix with decreasing swelling of the lower extremities.  Continue IV Lasix for now.  Daily weights and strict I&O's, low-salt diet.  Repeat echocardiogram. I suspect the patient is noncompliant with diet or medications at home. will monitor overnight and if remains stable DC in a.m.  Chronic hypoxic respiratory failure/ OHS/OSA Stable continue home oxygen  CKD stage III Creatinine from baseline, continue to monitor while on IV Lasix  Hypertension Lisinopril on hold due to renal function.  Continue Imdur.  Patient on Lasix.  Patient reported that she has been on clonidine at home but not listed  on home medication.  Chronic pain syndrome Continue Norco and gabapentin. DC indomethacin given renal function.  Morbid obesity Weight loss encouraged  DVT prophylaxis: Lovenox Code Status: Full code Family Communication: None at bedside Disposition Plan: Home in a.m. if stable.  Consultants:   None  Procedures:   None   Antimicrobials:  None    Objective: Vitals:   07/08/17 0944 07/08/17 1529 07/08/17 1610 07/08/17 1611  BP: (!) 120/47 (!) 124/112 (!) 95/57 (!) 94/58  Pulse: 71 (!) 59 69 70  Resp:      Temp:  98.1 F (36.7 C)    TempSrc:  Oral    SpO2:  (!) 77% 95%   Weight:      Height:        Intake/Output Summary (Last 24 hours) at 07/08/2017 1633 Last data filed at 07/08/2017 0006 Gross per 24 hour  Intake -  Output 1400 ml  Net -1400 ml   Filed Weights   07/07/17 0855 07/07/17 1641 07/08/17 0615  Weight: (!) 174.9 kg (385 lb 8 oz) (!) 178 kg (392 lb 6.7 oz) (!) 177.7 kg (391 lb 11.2 oz)    Examination:  General exam: Appears calm and comfortable  Respiratory system: BS diminished, mild crackles at the bases  Cardio system: S1S2, RRR. No murmurs, rubs or gallops, unable to see JVD due to body habitus Gastrointestinal system: Super obese, abdomen is nondistended, soft and nontender.  Central nervous system: Alert and oriented.. Extremities: Trace bilateral lower extremity edema, right knee pain with mobility Skin: No rashes  Data Reviewed: I have personally reviewed following labs and imaging studies  CBC: Recent Labs  Lab 07/07/17 0630 07/08/17 2426  WBC 8.1 8.9  NEUTROABS 5.1  --   HGB 12.1 12.3  HCT 40.2 41.3  MCV 100.0 100.0  PLT 192 426   Basic Metabolic Panel: Recent Labs  Lab 07/07/17 0630 07/08/17 0412  NA 141 141  K 5.6* 5.3*  CL 102 99*  CO2 28 30  GLUCOSE 99 114*  BUN 37* 35*  CREATININE 1.85* 1.78*  CALCIUM 8.6* 8.6*   GFR: Estimated Creatinine Clearance: 60.8 mL/min (A) (by C-G formula based on SCr of 1.78  mg/dL (H)). Liver Function Tests: Recent Labs  Lab 07/07/17 0630 07/08/17 0412  AST 11* 11*  ALT 10* 11*  ALKPHOS 53 55  BILITOT 0.4 0.7  PROT 6.8 6.8  ALBUMIN 3.5 3.5   No results for input(s): LIPASE, AMYLASE in the last 168 hours. No results for input(s): AMMONIA in the last 168 hours. Coagulation Profile: No results for input(s): INR, PROTIME in the last 168 hours. Cardiac Enzymes: No results for input(s): CKTOTAL, CKMB, CKMBINDEX, TROPONINI in the last 168 hours. BNP (last 3 results) No results for input(s): PROBNP in the last 8760 hours. HbA1C: No results for input(s): HGBA1C in the last 72 hours. CBG: No results for input(s): GLUCAP in the last 168 hours. Lipid Profile: No results for input(s): CHOL, HDL, LDLCALC, TRIG, CHOLHDL, LDLDIRECT in the last 72 hours. Thyroid Function Tests: No results for input(s): TSH, T4TOTAL, FREET4, T3FREE, THYROIDAB in the last 72 hours. Anemia Panel: No results for input(s): VITAMINB12, FOLATE, FERRITIN, TIBC, IRON, RETICCTPCT in the last 72 hours. Sepsis Labs: No results for input(s): PROCALCITON, LATICACIDVEN in the last 168 hours.  Recent Results (from the past 240 hour(s))  MRSA PCR Screening     Status: Abnormal   Collection Time: 07/07/17  5:36 PM  Result Value Ref Range Status   MRSA by PCR POSITIVE (A) NEGATIVE Final    Comment:        The GeneXpert MRSA Assay (FDA approved for NASAL specimens only), is one component of a comprehensive MRSA colonization surveillance program. It is not intended to diagnose MRSA infection nor to guide or monitor treatment for MRSA infections. RESULT CALLED TO, READ BACK BY AND VERIFIED WITH: SEAGRAVES,A. RN @1942  ON 04.16.19 BY COHEN,K Performed at [facility], [place] [address], [place], [place] [address]       Radiology Studies: Dg Chest 2 View  Result Date: 07/06/2017 CLINICAL DATA:  Short of breath EXAM: CHEST - 2 VIEW COMPARISON:  05/08/2017 FINDINGS: No  significant pleural effusion. Cardiomegaly with vascular congestion. No focal consolidation. No pneumothorax. IMPRESSION: Cardiomegaly with vascular congestion. No significant change compared to prior. Electronically Signed   By: Donavan Foil M.D.   On: 07/06/2017 22:48   Dg Knee 1-2 Views Right  Result Date: 07/08/2017 CLINICAL DATA:  Chronic right knee pain.  No known injury. EXAM: RIGHT KNEE - 1-2 VIEW COMPARISON:  Plain films of the right knee 01/02/2008. FINDINGS: Advanced for age tricompartmental osteoarthritis shows some progression since the prior examination. No acute bony or joint abnormality is seen. No joint effusion. A lobulated calcification measuring 1.2 cm in diameter posterior to the proximal tibia is likely a loose body in a Baker's cyst. Soft tissues are unremarkable. IMPRESSION: No acute finding. Advanced for age tricompartmental osteoarthritis has worsened since 2009. Calcification posterior to the knee is likely a loose body in a Baker's cyst. Electronically Signed   By: Inge Rise M.D.   On: 07/08/2017 13:42      Scheduled Meds: .  albuterol  2.5 mg Nebulization BID  . allopurinol  100 mg Oral q morning - 10a  . aspirin  325 mg Oral q morning - 10a  . atorvastatin  20 mg Oral q morning - 10a  . Chlorhexidine Gluconate Cloth  6 each Topical Q0600  . darifenacin  7.5 mg Oral Daily  . dextromethorphan-guaiFENesin  1 tablet Oral BID  . enoxaparin (LOVENOX) injection  80 mg Subcutaneous Q24H  . folic acid  1 mg Oral Daily  . furosemide  40 mg Intravenous BID  . gabapentin  100 mg Oral BID  . isosorbide mononitrate  30 mg Oral Daily  . mometasone-formoterol  2 puff Inhalation BID  . mupirocin ointment  1 application Nasal BID  . nystatin cream   Topical BID  . pantoprazole  40 mg Oral q morning - 10a  . [START ON 07/09/2017] Vitamin D (Ergocalciferol)  50,000 Units Oral Weekly   Continuous Infusions:   LOS: 0 days    Time spent: Total of 25 minutes spent with  pt, greater than 50% of which was spent in discussion of  treatment, counseling and coordination of care  Chipper Oman, MD Pager: Text Page via [email or web address]   If 7PM-7AM, please contact night-coverage [email or web address] 07/08/2017, 4:33 PM   Note - This record has been created using [employer]. Chart creation errors have been sought, but may not always have been located. Such creation errors do not reflect on the standard of medical care.

## 2017-07-08 NOTE — Evaluation (Signed)
Physical Therapy Evaluation Patient Details Name: JOANE POSTEL MRN: [id number] DOB: 1965-12-08 Today's Date: 07/08/2017   History of Present Illness  TONY FRISCIA is a 52 y.o. female with medical history significant of HFpEF, asthma, chronic hypoxic respiratory failure, OHS, OSA and several other chronic medical problems presenting with shortness of breath, lower extremity edema, and difficulty ambulating over the past 2-3 weeks.  Clinical Impression  Patient presents with decreased independence and safety with mobility due to deficits listed in PT problem list.  She will benefit from skilled PT in the acute setting to allow return home with follow up HHPT and intermittent assist of her "caregiver".    Follow Up Recommendations Home health PT    Equipment Recommendations  Rolling walker with 5" wheels;3in1 (PT);Other (comment)(bariatric; also reports needs to have oxygen serviced and needs new CPAP)    Recommendations for Other Services       Precautions / Restrictions Precautions Precautions: Fall Precaution Comments: oxygen dependent      Mobility  Bed Mobility Overal bed mobility: Needs Assistance Bed Mobility: Sit to Supine       Sit to supine: Supervision;HOB elevated   General bed mobility comments: increased time, able to adjust herself with difficulty/effort  Transfers Overall transfer level: Needs assistance Equipment used: Rolling walker (2 wheeled) Transfers: Sit to/from Stand Sit to Stand: Supervision         General transfer comment: not using walker to stand  Ambulation/Gait Ambulation/Gait assistance: Supervision;Min guard Ambulation Distance (Feet): 70 Feet Assistive device: Rolling walker (2 wheeled) Gait Pattern/deviations: Step-through pattern;Step-to pattern;Decreased stride length;Trunk flexed;Wide base of support;Antalgic     General Gait Details: pain worse on R with ambulation, SpO2 91% on 3L O2 with ambulation, dyspnea  3/4.  Stairs            Wheelchair Mobility    Modified Rankin (Stroke Patients Only)       Balance Overall balance assessment: Needs assistance   Sitting balance-Leahy Scale: Good     Standing balance support: Bilateral upper extremity supported Standing balance-Leahy Scale: Fair                               Pertinent Vitals/Pain Pain Assessment: 0-10 Pain Score: 9  Pain Location: legs, back and head Pain Descriptors / Indicators: Sharp;Shooting Pain Intervention(s): Monitored during session;Limited activity within patient's tolerance    Home Living Family/patient expects to be discharged to:: Other (Comment)(hotel) Living Arrangements: Alone Available Help at Discharge: Personal care attendant;Available PRN/intermittently(reports her "friend" is her "caregiver") Type of Home: Other(Comment)(hotel) Home Access: Level entry     Home Layout: One level Home Equipment: Financial trader - single point      Prior Function Level of Independence: Needs assistance   Gait / Transfers Assistance Needed: uses scooter or cane in motel room, but over past 3 weeks has had "caregiver" help her more with walks to bathroom due to LE pain/weakness and couple of falls  ADL's / Homemaking Assistance Needed: caregiver helps with bathing        Hand Dominance        Extremity/Trunk Assessment   Upper Extremity Assessment Upper Extremity Assessment: RUE deficits/detail;LUE deficits/detail RUE Deficits / Details: grossly AROM WFL, except limited shoulder elevation with reports of swelling in arms, strength at least 4/5 LUE Deficits / Details: grossly AROM WFL, except limited shoulder elevation with reports of swelling in arms, strength at least 4/5  Lower Extremity Assessment Lower Extremity Assessment: RLE deficits/detail;LLE deficits/detail RLE Deficits / Details: lifts antigravity but lifts R less than L reports due to bad knee with edema on  anterior aspect and below knee RLE Sensation: history of peripheral neuropathy LLE Deficits / Details: lifts antigravity but lifts R less than L reports due to bad knee with edema on anterior aspect and below knee LLE Sensation: history of peripheral neuropathy       Communication   Communication: No difficulties  Cognition Arousal/Alertness: Awake/alert Behavior During Therapy: WFL for tasks assessed/performed Overall Cognitive Status: Within Functional Limits for tasks assessed                                        General Comments General comments (skin integrity, edema, etc.): rash on lower back area and skin abrasians on lower legs she reports are from bug bites, not from falling    Exercises     Assessment/Plan    PT Assessment Patient needs continued PT services  PT Problem List Decreased strength;Decreased mobility;Decreased safety awareness;Decreased activity tolerance;Pain;Decreased balance;Decreased knowledge of use of DME       PT Treatment Interventions DME instruction;Therapeutic activities;Gait training;Therapeutic exercise;Other (comment);Stair training;Balance training;Wheelchair mobility training    PT Goals (Current goals can be found in the Care Plan section)  Acute Rehab PT Goals Patient Stated Goal: To get better PT Goal Formulation: With patient Time For Goal Achievement: 07/22/17 Potential to Achieve Goals: Good    Frequency Min 3X/week   Barriers to discharge        Co-evaluation               AM-PAC PT "6 Clicks" Daily Activity  Outcome Measure Difficulty turning over in bed (including adjusting bedclothes, sheets and blankets)?: A Lot Difficulty moving from lying on back to sitting on the side of the bed? : A Lot Difficulty sitting down on and standing up from a chair with arms (e.g., wheelchair, bedside commode, etc,.)?: A Little Help needed moving to and from a bed to chair (including a wheelchair)?: A Little Help  needed walking in hospital room?: A Little Help needed climbing 3-5 steps with a railing? : A Lot 6 Click Score: 15    End of Session Equipment Utilized During Treatment: Oxygen Activity Tolerance: Patient limited by fatigue Patient left: in bed;with call bell/phone within reach   PT Visit Diagnosis: Other abnormalities of gait and mobility (R26.89);Muscle weakness (generalized) (M62.81);Pain Pain - Right/Left: Right Pain - part of body: Knee    Time: 0942-1020 PT Time Calculation (min) (ACUTE ONLY): 38 min   Charges:   PT Evaluation $PT Eval Moderate Complexity: 1 Mod PT Treatments $Gait Training: 8-22 mins   PT G CodesMagda Kiel, [place] [phone] 07/08/2017   Reginia Naas 07/08/2017, 10:49 AM

## 2017-07-08 NOTE — Progress Notes (Signed)
Will not be able to supply CPAP machine to pt related to: CPAP(patient states she lost her CPAP), per [facility] she has an outstanding balance $46.25. [facility] provided her a charity CPAP in 2010. Patient never had sleep study and will need to have one. [facility] also has provided her with home 02. Will continue to follow.

## 2017-07-08 NOTE — Discharge Summary (Signed)
Physician Discharge Summary  Kara Vincent  WUJ:[id number]  DOB: Mar 19, 1966  DOA: 07/07/2017 PCP: Patient, No Pcp Per  Admit date: 07/07/2017 Discharge date: 07/10/2017  Admitted From: Home  Disposition: Home   Recommendations for Outpatient Follow-up:  1. Follow up with PCP in 1 week  2. Please obtain BMP/CBC in one week to monitor Hgb and renal function  3. Need sleep studies 4. Refer to nephrology as an outpatient. 5. Holding lisinopril due to renal function 6. Avoid NSAIDs 7. Refer to obesity clinic  Discharge Condition: Stable  CODE STATUS: Full code  Diet recommendation: Heart Healthy   Brief/Interim Summary: For full details see H&P/Progress note, but in brief, Kara Vincent is a 52 year old female with medical history significant for HFpEF, chronic hypoxic respiratory failure oxygen dependent, OHS/OSA, asthma and CKD stage III.  Patient presented to the emergency department complaining of shortness of breath, lower extremity edema and difficulty ambulating for the past 3 weeks.  Patient uses wheelchair at baseline and sometimes walk with a cane.  Upon ED evaluation chest x-ray shows cardiomegaly with vascular congestion, BNP 13, creatinine 1.78 mild elevated potassium to 5.6.  Patient was admitted with working diagnosis of diastolic CHF exacerbation and was started on IV diuresis.  Patient had good urine output and oxygen requirement remained stable.  Hospital stay was complicated by acute renal failure due to aggressive diuresis she was given gentle hydration and creatinine improved.  Patient was complaining of right knee pain and swelling for which x-ray was done showing chronic osteoarthritis that is getting worse.  Patient remained in stable  and was discharged to follow-up as an outpatient.  Subjective: Patient seen and examined, complaining of not feeling well overall.  Breathing at baseline.  Denies palpitations and chest pain.  Reported that her legs are swelling but  evidently they are not.  Requesting all her medications to be refilled.  Afebrile.  Discharge Diagnoses/Hospital Course:  Acute on chronic diastolic CHF (HFpEF) Normal BNP due to obesity, chest x-ray with increasing vascular congestion and cardiomegaly, lower extremity edema and SOB.  Patient had good diuresis after Lasix with decreasing swelling of the lower extremities, however creatinine has significantly increased. Lasix were discontinued on 250 cc of saline were given improving her creatinine.  Patient does not seems to be in fluid overload at this point.  Legs with no edema and lung exam with no crackles.  Echocardiogram was performed limited but no signs of worsening EF.  I suspect that this exacerbation is due to no medication compliance and poor diet with high salt intake.  Encourage patient to continue low-salt diet, limit fluid intake and adhere to prescribed medications.  Also patient does not wear her CPAP machine or oxygen at home likely contributing to symptoms.  Will continue home dose Lasix to resume on 4/20.  Encourage patient to do daily weights. Follow-up with PCP in 1 week  Chronic hypoxic respiratory failure/ OHS/OSA Stable, patient continues to remove her oxygen supplementation.  Encouraged to keep nasal cannula in place.  Need new sleep studies for new CPAP machine.  Acute renal failure on CKD stage III This is secondary to aggressive diuresis in setting of CKD. Creatinine improved after 250 cc of IV fluid. Recommend for patient to be seen by nephrologist as an outpatient.  Hypertension BP is soft but stable  Will continue to hold Lisinopril due to renal function, Resume Imdur and Lasix 4/20.  Follow up with PCP in 1 week to check BP and resume  medications as deemed necessary   Chronic pain syndrome Continue Norco and gabapentin. Discontinue indomethacin given renal function.  Morbid obesity Weight loss encouraged  All other chronic medical condition were  stable during the hospitalization.  Patient was seen by physical therapy, recommending HHPT  On the day of the discharge the patient's vitals were stable, and no other acute medical condition were reported by patient. the patient was felt safe to be discharge to Home   Discharge Instructions  You were cared for by a hospitalist during your hospital stay. If you have any questions about your discharge medications or the care you received while you were in the hospital after you are discharged, you can call the unit and asked to speak with the hospitalist on call if the hospitalist that took care of you is not available. Once you are discharged, your primary care physician will handle any further medical issues. Please note that NO REFILLS for any discharge medications will be authorized once you are discharged, as it is imperative that you return to your primary care physician (or establish a relationship with a primary care physician if you do not have one) for your aftercare needs so that they can reassess your need for medications and monitor your lab values.  Discharge Instructions    (HEART FAILURE PATIENTS) Call MD:  Anytime you have any of the following symptoms: 1) 3 pound weight gain in 24 hours or 5 pounds in 1 week 2) shortness of breath, with or without a dry hacking cough 3) swelling in the hands, feet or stomach 4) if you have to sleep on extra pillows at night in order to breathe.   Complete by:  As directed    Call MD for:  difficulty breathing, headache or visual disturbances   Complete by:  As directed    Call MD for:  extreme fatigue   Complete by:  As directed    Call MD for:  hives   Complete by:  As directed    Call MD for:  persistant dizziness or light-headedness   Complete by:  As directed    Call MD for:  persistant nausea and vomiting   Complete by:  As directed    Call MD for:  redness, tenderness, or signs of infection (pain, swelling, redness, odor or green/yellow  discharge around incision site)   Complete by:  As directed    Call MD for:  severe uncontrolled pain   Complete by:  As directed    Call MD for:  temperature >100.4   Complete by:  As directed    Diet - low sodium heart healthy   Complete by:  As directed    Increase activity slowly   Complete by:  As directed      Allergies as of 07/10/2017      Reactions   Ibuprofen Hives, Shortness Of Breath, Palpitations, Rash   Has tolerated toradol 12/2013 inpatient   Penicillins Anaphylaxis, Hives, Rash   Has patient had a PCN reaction causing immediate rash, facial/tongue/throat swelling, SOB or lightheadedness with hypotension: Yes Has patient had a PCN reaction causing severe rash involving mucus membranes or skin necrosis: Yes Has patient had a PCN reaction that required hospitalization Unknown Has patient had a PCN reaction occurring within the last 10 years: Unknown If all of the above answers are "NO", then may proceed with Cephalosporin use.   Sulfa Antibiotics Hives, Shortness Of Breath, Rash, Cough   Adhesive [tape] Rash   Doxycycline Hives,  Swelling, Rash   Sulfamethoxazole-trimethoprim Rash   Zithromax [azithromycin] Hives, Swelling, Rash      Medication List    STOP taking these medications   doxycycline 100 MG tablet Commonly known as:  ADOXA   indomethacin 75 MG CR capsule Commonly known as:  INDOCIN SR   ketoconazole 200 MG tablet Commonly known as:  NIZORAL   levofloxacin 500 MG tablet Commonly known as:  LEVAQUIN   lisinopril 40 MG tablet Commonly known as:  PRINIVIL,ZESTRIL   nitrofurantoin (macrocrystal-monohydrate) 100 MG capsule Commonly known as:  MACROBID     TAKE these medications   albuterol 108 (90 Base) MCG/ACT inhaler Commonly known as:  PROVENTIL HFA;VENTOLIN HFA Inhale 1-2 puffs into the lungs every 6 (six) hours as needed for wheezing or shortness of breath.   allopurinol 100 MG tablet Commonly known as:  ZYLOPRIM Take 1 tablet (100 mg  total) by mouth every morning.   ALPRAZolam 1 MG tablet Commonly known as:  XANAX Take 1 mg by mouth 3 (three) times daily as needed for anxiety (anxiety).   ammonium lactate 12 % cream Commonly known as:  AMLACTIN Apply 1 g topically daily as needed for dry skin.   aspirin 325 MG tablet Take 325 mg by mouth every morning.   atorvastatin 20 MG tablet Commonly known as:  LIPITOR Take 1 tablet (20 mg total) by mouth every morning.   benzonatate 100 MG capsule Commonly known as:  TESSALON Take 100 mg by mouth 3 (three) times daily as needed for cough.   budesonide-formoterol 160-4.5 MCG/ACT inhaler Commonly known as:  SYMBICORT Inhale 2 puffs into the lungs 2 (two) times daily.   butalbital-acetaminophen-caffeine 50-325-40 MG tablet Commonly known as:  FIORICET, ESGIC Take 1 tablet by mouth every 6 (six) hours as needed for headache.   dextromethorphan-guaiFENesin 30-600 MG 12hr tablet Commonly known as:  MUCINEX DM Take 1 tablet by mouth 2 (two) times daily.   eucerin lotion Apply 5 mLs topically 2 (two) times daily as needed for dry skin.   FISH OIL PO Take 1 capsule by mouth 2 (two) times daily.   folic acid 1 MG tablet Commonly known as:  FOLVITE Take 1 tablet (1 mg total) by mouth daily.   furosemide 20 MG tablet Commonly known as:  LASIX Take 1 tablet (20 mg total) by mouth 2 (two) times daily. Start taking on:  07/11/2017 What changed:  when to take this   gabapentin 100 MG capsule Commonly known as:  NEURONTIN Take 1 capsule (100 mg total) by mouth 2 (two) times daily.   HYDROcodone-acetaminophen 10-325 MG tablet Commonly known as:  NORCO Take 1 tablet by mouth every 4 (four) hours as needed for moderate pain.   hydrOXYzine 25 MG tablet Commonly known as:  ATARAX/VISTARIL Take 25 mg by mouth 3 (three) times daily as needed for anxiety or itching (anxiety and itching).   ipratropium-albuterol 0.5-2.5 (3) MG/3ML Soln Commonly known as:  DUONEB Take 3  mLs by nebulization every 6 (six) hours as needed. What changed:  reasons to take this   isosorbide mononitrate 30 MG 24 hr tablet Commonly known as:  IMDUR Take 1 tablet (30 mg total) by mouth daily. Start taking on:  07/11/2017   ketoconazole 2 % shampoo Commonly known as:  NIZORAL Apply 1 application topically 2 (two) times a week. Start taking on:  07/13/2017   nystatin cream Commonly known as:  MYCOSTATIN Apply to affected area 2 times daily   pantoprazole 40 MG  tablet Commonly known as:  PROTONIX Take 1 tablet (40 mg total) by mouth every morning.   solifenacin 5 MG tablet Commonly known as:  VESICARE Take 1 tablet (5 mg total) by mouth every morning.   Vitamin D (Ergocalciferol) 50000 units Caps capsule Commonly known as:  DRISDOL Take 1 capsule (50,000 Units total) by mouth once a week.      Follow-up Information    PCP. Schedule an appointment as soon as possible for a visit in 1 week(s).   Why:  Hospital follow up          Allergies  Allergen Reactions  . Ibuprofen Hives, Shortness Of Breath, Palpitations and Rash    Has tolerated toradol 12/2013 inpatient  . Penicillins Anaphylaxis, Hives and Rash    Has patient had a PCN reaction causing immediate rash, facial/tongue/throat swelling, SOB or lightheadedness with hypotension: Yes Has patient had a PCN reaction causing severe rash involving mucus membranes or skin necrosis: Yes Has patient had a PCN reaction that required hospitalization Unknown Has patient had a PCN reaction occurring within the last 10 years: Unknown If all of the above answers are "NO", then may proceed with Cephalosporin use.   . Sulfa Antibiotics Hives, Shortness Of Breath, Rash and Cough  . Adhesive [Tape] Rash  . Doxycycline Hives, Swelling and Rash  . Sulfamethoxazole-Trimethoprim Rash  . Zithromax [Azithromycin] Hives, Swelling and Rash    Consultations:  None    Procedures/Studies: Dg Chest 2 View  Result Date:  07/06/2017 CLINICAL DATA:  Short of breath EXAM: CHEST - 2 VIEW COMPARISON:  05/08/2017 FINDINGS: No significant pleural effusion. Cardiomegaly with vascular congestion. No focal consolidation. No pneumothorax. IMPRESSION: Cardiomegaly with vascular congestion. No significant change compared to prior. Electronically Signed   By: Donavan Foil M.D.   On: 07/06/2017 22:48   Dg Knee 1-2 Views Right  Result Date: 07/08/2017 CLINICAL DATA:  Chronic right knee pain.  No known injury. EXAM: RIGHT KNEE - 1-2 VIEW COMPARISON:  Plain films of the right knee 01/02/2008. FINDINGS: Advanced for age tricompartmental osteoarthritis shows some progression since the prior examination. No acute bony or joint abnormality is seen. No joint effusion. A lobulated calcification measuring 1.2 cm in diameter posterior to the proximal tibia is likely a loose body in a Baker's cyst. Soft tissues are unremarkable. IMPRESSION: No acute finding. Advanced for age tricompartmental osteoarthritis has worsened since 2009. Calcification posterior to the knee is likely a loose body in a Baker's cyst. Electronically Signed   By: Inge Rise M.D.   On: 07/08/2017 13:42   ECHO 4/18  ------------------------------------------------------------------- Impressions:  - Very limited, technically poor study due to body habitus and   uncooperative patient.     The left ventricle appears normal in size and systolic function.   No major wall motion abnormalities are seen.   There is impaired relaxation, without evidence of elevated   filling pressures.   The right ventricle is probably dilated, but has normal systolic   function.   The aortic valve is poorly seen, but the Doppler flow is   consistent with very mild aortic stenosis.   There is no signficant pericardial effusion.   The mitral, tricuspi and pulmonic valves were poorly seen.   Unable to comment onthe aorta or the left or right atrial sizes.   Unable to estimate PA  pressure.  Discharge Exam: Vitals:   07/10/17 0708 07/10/17 0842  BP: (!) 93/53   Pulse: 75   Resp: 20  Temp: 98 F (36.7 C)   SpO2:  94%   Vitals:   07/09/17 2126 07/09/17 2212 07/10/17 0708 07/10/17 0842  BP:  (!) 98/54 (!) 93/53   Pulse:  75 75   Resp:  16 20   Temp:  98.2 F (36.8 C) 98 F (36.7 C)   TempSrc:   Oral   SpO2: 96% 96%  94%  Weight:   (!) 181.5 kg (400 lb 1.6 oz)   Height:        General: NAD Cardiovascular: RRR, S1/S2 + Respiratory: Decrease BS due to body habitus no wheezing or rales noted  Abdominal: Soft, NT, ND Extremities: no edema  The results of significant diagnostics from this hospitalization (including imaging, microbiology, ancillary and laboratory) are listed below for reference.     Microbiology: Recent Results (from the past 240 hour(s))  MRSA PCR Screening     Status: Abnormal   Collection Time: 07/07/17  5:36 PM  Result Value Ref Range Status   MRSA by PCR POSITIVE (A) NEGATIVE Final    Comment:        The GeneXpert MRSA Assay (FDA approved for NASAL specimens only), is one component of a comprehensive MRSA colonization surveillance program. It is not intended to diagnose MRSA infection nor to guide or monitor treatment for MRSA infections. RESULT CALLED TO, READ BACK BY AND VERIFIED WITH: SEAGRAVES,A. RN @1942  ON 04.16.19 BY COHEN,K Performed at [facility], [address] [place], [place] [address]      Labs: BNP (last 3 results) Recent Labs    01/21/17 2330 05/08/17 2334 07/07/17 0630  BNP 34.9 19.4 40.1   Basic Metabolic Panel: Recent Labs  Lab 07/07/17 0630 07/08/17 0412 07/09/17 0356 07/10/17 0436  NA 141 141 140 139  K 5.6* 5.3* 5.2* 5.5*  CL 102 99* 96* 97*  CO2 28 30 33* 32  GLUCOSE 99 114* 111* 114*  BUN 37* 35* 43* 44*  CREATININE 1.85* 1.78* 2.27* 2.07*  CALCIUM 8.6* 8.6* 8.5* 8.6*  MG  --   --  2.1 2.1   Liver Function Tests: Recent Labs  Lab 07/07/17 0630  07/08/17 0412  AST 11* 11*  ALT 10* 11*  ALKPHOS 53 55  BILITOT 0.4 0.7  PROT 6.8 6.8  ALBUMIN 3.5 3.5   No results for input(s): LIPASE, AMYLASE in the last 168 hours. No results for input(s): AMMONIA in the last 168 hours. CBC: Recent Labs  Lab 07/07/17 0630 07/08/17 0412  WBC 8.1 8.9  NEUTROABS 5.1  --   HGB 12.1 12.3  HCT 40.2 41.3  MCV 100.0 100.0  PLT 192 210   Cardiac Enzymes: No results for input(s): CKTOTAL, CKMB, CKMBINDEX, TROPONINI in the last 168 hours. BNP: Invalid input(s): POCBNP CBG: No results for input(s): GLUCAP in the last 168 hours. D-Dimer No results for input(s): DDIMER in the last 72 hours. Hgb A1c No results for input(s): HGBA1C in the last 72 hours. Lipid Profile No results for input(s): CHOL, HDL, LDLCALC, TRIG, CHOLHDL, LDLDIRECT in the last 72 hours. Thyroid function studies No results for input(s): TSH, T4TOTAL, T3FREE, THYROIDAB in the last 72 hours.  Invalid input(s): FREET3 Anemia work up No results for input(s): VITAMINB12, FOLATE, FERRITIN, TIBC, IRON, RETICCTPCT in the last 72 hours. Urinalysis    Component Value Date/Time   COLORURINE STRAW (A) 07/07/2017 0902   APPEARANCEUR CLEAR 07/07/2017 0902   LABSPEC 1.012 07/07/2017 0902   PHURINE 6.0 07/07/2017 0902   GLUCOSEU NEGATIVE 07/07/2017  0902   GLUCOSEU NEG mg/dL 10/01/2007 2007   HGBUR NEGATIVE 07/07/2017 0902   HGBUR negative 12/05/2009 1003   BILIRUBINUR NEGATIVE 07/07/2017 0902   BILIRUBINUR small 07/31/2010 1713   KETONESUR NEGATIVE 07/07/2017 0902   PROTEINUR NEGATIVE 07/07/2017 0902   UROBILINOGEN 0.2 12/01/2014 2240   NITRITE NEGATIVE 07/07/2017 0902   LEUKOCYTESUR NEGATIVE 07/07/2017 0902   Sepsis Labs Invalid input(s): PROCALCITONIN,  WBC,  LACTICIDVEN Microbiology Recent Results (from the past 240 hour(s))  MRSA PCR Screening     Status: Abnormal   Collection Time: 07/07/17  5:36 PM  Result Value Ref Range Status   MRSA by PCR POSITIVE (A) NEGATIVE  Final    Comment:        The GeneXpert MRSA Assay (FDA approved for NASAL specimens only), is one component of a comprehensive MRSA colonization surveillance program. It is not intended to diagnose MRSA infection nor to guide or monitor treatment for MRSA infections. RESULT CALLED TO, READ BACK BY AND VERIFIED WITH: SEAGRAVES,A. RN @1942  ON 04.16.19 BY COHEN,K Performed at [facility], [place] [address] [place], [place] [address]      Time coordinating discharge: 32 minutes  SIGNED:  Chipper Oman, MD  [facility] 07/10/2017, 12:03 PM  Pager please text page via  [email or web address]  Note - This record has been created using [employer]. Chart creation errors have been sought, but may not always have been located. Such creation errors do not reflect on the standard of medical care.

## 2017-07-09 ENCOUNTER — Inpatient Hospital Stay (HOSPITAL_COMMUNITY): Payer: Medicaid Other

## 2017-07-09 DIAGNOSIS — I5033 Acute on chronic diastolic (congestive) heart failure: Secondary | ICD-10-CM

## 2017-07-09 DIAGNOSIS — I509 Heart failure, unspecified: Secondary | ICD-10-CM

## 2017-07-09 DIAGNOSIS — G4733 Obstructive sleep apnea (adult) (pediatric): Secondary | ICD-10-CM

## 2017-07-09 LAB — BASIC METABOLIC PANEL
ANION GAP: 11 (ref 5–15)
BUN: 43 mg/dL — ABNORMAL HIGH (ref 6–20)
CALCIUM: 8.5 mg/dL — AB (ref 8.9–10.3)
CO2: 33 mmol/L — AB (ref 22–32)
Chloride: 96 mmol/L — ABNORMAL LOW (ref 101–111)
Creatinine, Ser: 2.27 mg/dL — ABNORMAL HIGH (ref 0.44–1.00)
GFR calc non Af Amer: 23 mL/min — ABNORMAL LOW (ref >60)
GFR, EST AFRICAN AMERICAN: 27 mL/min — AB (ref >60)
GLUCOSE: 111 mg/dL — AB (ref 65–99)
POTASSIUM: 5.2 mmol/L — AB (ref 3.5–5.1)
Sodium: 140 mmol/L (ref 135–145)

## 2017-07-09 LAB — ECHOCARDIOGRAM COMPLETE
Height: 65 in
Weight: 6349.25 oz

## 2017-07-09 LAB — MAGNESIUM: Magnesium: 2.1 mg/dL (ref 1.7–2.4)

## 2017-07-09 MED ORDER — SODIUM CHLORIDE 0.9 % IV BOLUS
250.0000 mL | Freq: Once | INTRAVENOUS | Status: AC
  Administered 2017-07-09: 250 mL via INTRAVENOUS

## 2017-07-09 MED ORDER — POLYVINYL ALCOHOL 1.4 % OP SOLN
1.0000 [drp] | OPHTHALMIC | Status: DC | PRN
  Filled 2017-07-09: qty 15

## 2017-07-09 MED ORDER — PERFLUTREN LIPID MICROSPHERE
INTRAVENOUS | Status: AC
  Filled 2017-07-09: qty 10

## 2017-07-09 NOTE — Progress Notes (Signed)
PROGRESS NOTE [facility]   Kara Vincent   ZOX:[id number] DOB: [birth year]  DOA: 07/07/2017 PCP: Patient, No Pcp Per   Brief Narrative:  Kara Vincent is a 52 year old female with medical history significant for HFpEF, chronic hypoxic respiratory failure oxygen dependent, OHS/OSA, asthma and CKD stage III.  Patient presented to the emergency department complaining of shortness of breath, lower extremity edema and difficulty ambulating for the past 3 weeks.  Patient uses wheelchair at baseline and sometimes walk with a cane.  Upon ED evaluation chest x-ray shows cardiomegaly with vascular congestion, BNP 13, creatinine 1.78 mild elevated potassium to 5.6.  Patient was admitted with working diagnosis of diastolic CHF exacerbation and was started on IV diuresis.  Patient had good urine output and oxygen requirement remained stable.  Patient was complaining of right knee pain and swelling for which x-ray was done showing chronic osteoarthritis that is getting worse.   Subjective: Patient seen and examined, report doing slight better today complaining of shortness of breath.  Assessment & Plan: Acute on chronic diastolic CHF (HFpEF) Normal BNP due to obesity, chest x-ray with increasing vascular congestion and cardiomegaly, lower extremity edema and SOB.  Patient had good diuresis after Lasix with decreasing swelling of the lower extremities, however creatinine has significantly increase.  Discontinue IV Lasix.  Patient does not seems to be in fluid overload at this point.  Legs with no edema and lung exam with no crackles.  Echocardiogram was performed limited but no signs of worsening EF.  Chronic hypoxic respiratory failure/ OHS/OSA Stable, patient continues to remove her oxygen supplementation.  Encouraged to keep nasal cannula in place.  Acute renal failure on CKD stage III This is secondary to aggressive diuresis in setting of CKD. We will give 250 cc of IV fluid, stop IV Lasix.   Monitor creatinine in a.m.  Hypertension BP now soft  Lisinopril on hold due to renal function.  Hold Imdur and Lasix.  Patient reported that she has been on clonidine at home but not listed on home medication.  Chronic pain syndrome Continue Norco and gabapentin. DC indomethacin given renal function.  Morbid obesity Weight loss encouraged  DVT prophylaxis: Lovenox Code Status: Full code Family Communication: None at bedside Disposition Plan: Home in a.m. if creatinine stable  Consultants:   None  Procedures:   None   Antimicrobials:  None    Objective: Vitals:   07/08/17 2224 07/09/17 0614 07/09/17 0759 07/09/17 1342  BP: 106/63 (!) 92/54  (!) 96/55  Pulse: 73 64  72  Resp: 18 18  18   Temp: 97.6 F (36.4 C) 97.9 F (36.6 C)  98 F (36.7 C)  TempSrc: Oral Oral  Oral  SpO2: 97% 97% 95% 95%  Weight:  (!) 180 kg (396 lb 13.3 oz)    Height:        Intake/Output Summary (Last 24 hours) at 07/09/2017 1429 Last data filed at 07/09/2017 1030 Gross per 24 hour  Intake 600 ml  Output 1700 ml  Net -1100 ml   Filed Weights   07/07/17 1641 07/08/17 0615 07/09/17 0614  Weight: (!) 178 kg (392 lb 6.7 oz) (!) 177.7 kg (391 lb 11.2 oz) (!) 180 kg (396 lb 13.3 oz)    Examination:  General: Pt is alert, awake, not in acute distress Cardiovascular: RRR, S1/S2 +, no rubs, no gallops Respiratory: Good air entry, no wheezing or crackles Abdominal: Obese, soft, NT, ND, bowel sounds + Extremities: Trace lower extremity edema  Data  Reviewed: I have personally reviewed following labs and imaging studies  CBC: Recent Labs  Lab 07/07/17 0630 07/08/17 0412  WBC 8.1 8.9  NEUTROABS 5.1  --   HGB 12.1 12.3  HCT 40.2 41.3  MCV 100.0 100.0  PLT 192 371   Basic Metabolic Panel: Recent Labs  Lab 07/07/17 0630 07/08/17 0412 07/09/17 0356  NA 141 141 140  K 5.6* 5.3* 5.2*  CL 102 99* 96*  CO2 28 30 33*  GLUCOSE 99 114* 111*  BUN 37* 35* 43*  CREATININE 1.85* 1.78*  2.27*  CALCIUM 8.6* 8.6* 8.5*  MG  --   --  2.1   GFR: Estimated Creatinine Clearance: 48.1 mL/min (A) (by C-G formula based on SCr of 2.27 mg/dL (H)). Liver Function Tests: Recent Labs  Lab 07/07/17 0630 07/08/17 0412  AST 11* 11*  ALT 10* 11*  ALKPHOS 53 55  BILITOT 0.4 0.7  PROT 6.8 6.8  ALBUMIN 3.5 3.5   No results for input(s): LIPASE, AMYLASE in the last 168 hours. No results for input(s): AMMONIA in the last 168 hours. Coagulation Profile: No results for input(s): INR, PROTIME in the last 168 hours. Cardiac Enzymes: No results for input(s): CKTOTAL, CKMB, CKMBINDEX, TROPONINI in the last 168 hours. BNP (last 3 results) No results for input(s): PROBNP in the last 8760 hours. HbA1C: No results for input(s): HGBA1C in the last 72 hours. CBG: No results for input(s): GLUCAP in the last 168 hours. Lipid Profile: No results for input(s): CHOL, HDL, LDLCALC, TRIG, CHOLHDL, LDLDIRECT in the last 72 hours. Thyroid Function Tests: No results for input(s): TSH, T4TOTAL, FREET4, T3FREE, THYROIDAB in the last 72 hours. Anemia Panel: No results for input(s): VITAMINB12, FOLATE, FERRITIN, TIBC, IRON, RETICCTPCT in the last 72 hours. Sepsis Labs: No results for input(s): PROCALCITON, LATICACIDVEN in the last 168 hours.  Recent Results (from the past 240 hour(s))  MRSA PCR Screening     Status: Abnormal   Collection Time: 07/07/17  5:36 PM  Result Value Ref Range Status   MRSA by PCR POSITIVE (A) NEGATIVE Final    Comment:        The GeneXpert MRSA Assay (FDA approved for NASAL specimens only), is one component of a comprehensive MRSA colonization surveillance program. It is not intended to diagnose MRSA infection nor to guide or monitor treatment for MRSA infections. RESULT CALLED TO, READ BACK BY AND VERIFIED WITH: SEAGRAVES,A. RN @1942  ON 04.16.19 BY COHEN,K Performed at [facility], [place] [address] [place], [place] [address]       Radiology  Studies: Dg Knee 1-2 Views Right  Result Date: 07/08/2017 CLINICAL DATA:  Chronic right knee pain.  No known injury. EXAM: RIGHT KNEE - 1-2 VIEW COMPARISON:  Plain films of the right knee 01/02/2008. FINDINGS: Advanced for age tricompartmental osteoarthritis shows some progression since the prior examination. No acute bony or joint abnormality is seen. No joint effusion. A lobulated calcification measuring 1.2 cm in diameter posterior to the proximal tibia is likely a loose body in a Baker's cyst. Soft tissues are unremarkable. IMPRESSION: No acute finding. Advanced for age tricompartmental osteoarthritis has worsened since 2009. Calcification posterior to the knee is likely a loose body in a Baker's cyst. Electronically Signed   By: Inge Rise M.D.   On: 07/08/2017 13:42      Scheduled Meds: . albuterol  2.5 mg Nebulization BID  . allopurinol  100 mg Oral q morning - 10a  . aspirin  325 mg  Oral q morning - 10a  . atorvastatin  20 mg Oral q morning - 10a  . Chlorhexidine Gluconate Cloth  6 each Topical Q0600  . darifenacin  7.5 mg Oral Daily  . dextromethorphan-guaiFENesin  1 tablet Oral BID  . enoxaparin (LOVENOX) injection  80 mg Subcutaneous Q24H  . folic acid  1 mg Oral Daily  . gabapentin  100 mg Oral BID  . isosorbide mononitrate  30 mg Oral Daily  . mometasone-formoterol  2 puff Inhalation BID  . mupirocin ointment  1 application Nasal BID  . nystatin cream   Topical BID  . pantoprazole  40 mg Oral q morning - 10a  . Vitamin D (Ergocalciferol)  50,000 Units Oral Weekly   Continuous Infusions:   LOS: 1 day    Time spent: Total of 25 minutes spent with pt, greater than 50% of which was spent in discussion of  treatment, counseling and coordination of care  Chipper Oman, MD Pager: Text Page via [email or web address]   If 7PM-7AM, please contact night-coverage [email or web address] 07/09/2017, 2:29 PM   Note - This record has been created using [employer]. Chart creation errors  have been sought, but may not always have been located. Such creation errors do not reflect on the standard of medical care.

## 2017-07-09 NOTE — Progress Notes (Signed)
  Echocardiogram 2D Echocardiogram has been performed.  Merrie Roof F 07/09/2017, 10:06 AM

## 2017-07-10 ENCOUNTER — Emergency Department (HOSPITAL_COMMUNITY): Payer: Medicaid Other

## 2017-07-10 ENCOUNTER — Inpatient Hospital Stay (HOSPITAL_COMMUNITY)
Admission: EM | Admit: 2017-07-10 | Discharge: 2017-07-16 | Disposition: A | Discharge status: Home / Self Care | Payer: Medicaid Other | Source: Home / Self Care | Attending: Internal Medicine | Admitting: Internal Medicine

## 2017-07-10 ENCOUNTER — Encounter (HOSPITAL_COMMUNITY): Payer: Self-pay | Admitting: Emergency Medicine

## 2017-07-10 DIAGNOSIS — I5033 Acute on chronic diastolic (congestive) heart failure: Secondary | ICD-10-CM

## 2017-07-10 DIAGNOSIS — K219 Gastro-esophageal reflux disease without esophagitis: Secondary | ICD-10-CM

## 2017-07-10 DIAGNOSIS — R55 Syncope and collapse: Secondary | ICD-10-CM

## 2017-07-10 DIAGNOSIS — N183 Chronic kidney disease, stage 3 unspecified: Secondary | ICD-10-CM | POA: Diagnosis present

## 2017-07-10 DIAGNOSIS — E875 Hyperkalemia: Secondary | ICD-10-CM | POA: Diagnosis present

## 2017-07-10 DIAGNOSIS — J9601 Acute respiratory failure with hypoxia: Secondary | ICD-10-CM

## 2017-07-10 DIAGNOSIS — M109 Gout, unspecified: Secondary | ICD-10-CM | POA: Diagnosis present

## 2017-07-10 DIAGNOSIS — J9622 Acute and chronic respiratory failure with hypercapnia: Secondary | ICD-10-CM

## 2017-07-10 DIAGNOSIS — J9611 Chronic respiratory failure with hypoxia: Secondary | ICD-10-CM

## 2017-07-10 DIAGNOSIS — R0602 Shortness of breath: Secondary | ICD-10-CM

## 2017-07-10 DIAGNOSIS — J9621 Acute and chronic respiratory failure with hypoxia: Secondary | ICD-10-CM

## 2017-07-10 DIAGNOSIS — N179 Acute kidney failure, unspecified: Secondary | ICD-10-CM

## 2017-07-10 DIAGNOSIS — R079 Chest pain, unspecified: Secondary | ICD-10-CM

## 2017-07-10 DIAGNOSIS — I10 Essential (primary) hypertension: Secondary | ICD-10-CM | POA: Diagnosis present

## 2017-07-10 DIAGNOSIS — E662 Morbid (severe) obesity with alveolar hypoventilation: Secondary | ICD-10-CM

## 2017-07-10 DIAGNOSIS — G4733 Obstructive sleep apnea (adult) (pediatric): Secondary | ICD-10-CM

## 2017-07-10 DIAGNOSIS — J9602 Acute respiratory failure with hypercapnia: Secondary | ICD-10-CM

## 2017-07-10 LAB — CBC WITH DIFFERENTIAL/PLATELET
BASOS PCT: 0 %
Basophils Absolute: 0 10*3/uL (ref 0.0–0.1)
EOS PCT: 3 %
Eosinophils Absolute: 0.3 10*3/uL (ref 0.0–0.7)
HCT: 38.1 % (ref 36.0–46.0)
Hemoglobin: 11.7 g/dL — ABNORMAL LOW (ref 12.0–15.0)
Lymphocytes Relative: 16 %
Lymphs Abs: 1.5 10*3/uL (ref 0.7–4.0)
MCH: 30.6 pg (ref 26.0–34.0)
MCHC: 30.7 g/dL (ref 30.0–36.0)
MCV: 99.7 fL (ref 78.0–100.0)
MONO ABS: 0.6 10*3/uL (ref 0.1–1.0)
MONOS PCT: 6 %
Neutro Abs: 6.7 10*3/uL (ref 1.7–7.7)
Neutrophils Relative %: 75 %
PLATELETS: 194 10*3/uL (ref 150–400)
RBC: 3.82 MIL/uL — ABNORMAL LOW (ref 3.87–5.11)
RDW: 14.2 % (ref 11.5–15.5)
WBC: 9 10*3/uL (ref 4.0–10.5)

## 2017-07-10 LAB — COMPREHENSIVE METABOLIC PANEL
ALT: 14 U/L (ref 14–54)
ANION GAP: 8 (ref 5–15)
AST: 21 U/L (ref 15–41)
Albumin: 3.8 g/dL (ref 3.5–5.0)
Alkaline Phosphatase: 63 U/L (ref 38–126)
BUN: 44 mg/dL — AB (ref 6–20)
CHLORIDE: 100 mmol/L — AB (ref 101–111)
CO2: 32 mmol/L (ref 22–32)
Calcium: 8.5 mg/dL — ABNORMAL LOW (ref 8.9–10.3)
Creatinine, Ser: 2.19 mg/dL — ABNORMAL HIGH (ref 0.44–1.00)
GFR calc Af Amer: 28 mL/min — ABNORMAL LOW (ref >60)
GFR, EST NON AFRICAN AMERICAN: 24 mL/min — AB (ref >60)
Glucose, Bld: 109 mg/dL — ABNORMAL HIGH (ref 65–99)
POTASSIUM: 6.4 mmol/L — AB (ref 3.5–5.1)
Sodium: 140 mmol/L (ref 135–145)
TOTAL PROTEIN: 7.4 g/dL (ref 6.5–8.1)
Total Bilirubin: 0.6 mg/dL (ref 0.3–1.2)

## 2017-07-10 LAB — BASIC METABOLIC PANEL
Anion gap: 10 (ref 5–15)
BUN: 44 mg/dL — AB (ref 6–20)
CALCIUM: 8.6 mg/dL — AB (ref 8.9–10.3)
CHLORIDE: 97 mmol/L — AB (ref 101–111)
CO2: 32 mmol/L (ref 22–32)
CREATININE: 2.07 mg/dL — AB (ref 0.44–1.00)
GFR calc Af Amer: 30 mL/min — ABNORMAL LOW (ref >60)
GFR calc non Af Amer: 26 mL/min — ABNORMAL LOW (ref >60)
Glucose, Bld: 114 mg/dL — ABNORMAL HIGH (ref 65–99)
Potassium: 5.5 mmol/L — ABNORMAL HIGH (ref 3.5–5.1)
Sodium: 139 mmol/L (ref 135–145)

## 2017-07-10 LAB — I-STAT CHEM 8, ED
BUN: 41 mg/dL — ABNORMAL HIGH (ref 6–20)
CREATININE: 2.1 mg/dL — AB (ref 0.44–1.00)
Calcium, Ion: 1.08 mmol/L — ABNORMAL LOW (ref 1.15–1.40)
Chloride: 98 mmol/L — ABNORMAL LOW (ref 101–111)
Glucose, Bld: 107 mg/dL — ABNORMAL HIGH (ref 65–99)
HEMATOCRIT: 37 % (ref 36.0–46.0)
HEMOGLOBIN: 12.6 g/dL (ref 12.0–15.0)
POTASSIUM: 6.1 mmol/L — AB (ref 3.5–5.1)
SODIUM: 137 mmol/L (ref 135–145)
TCO2: 34 mmol/L — AB (ref 22–32)

## 2017-07-10 LAB — I-STAT TROPONIN, ED: Troponin i, poc: 0 ng/mL (ref 0.00–0.08)

## 2017-07-10 LAB — BRAIN NATRIURETIC PEPTIDE: B Natriuretic Peptide: 12.5 pg/mL (ref 0.0–100.0)

## 2017-07-10 LAB — MAGNESIUM: Magnesium: 2.1 mg/dL (ref 1.7–2.4)

## 2017-07-10 MED ORDER — SODIUM POLYSTYRENE SULFONATE 15 GM/60ML PO SUSP
15.0000 g | Freq: Once | ORAL | Status: AC
Start: 2017-07-10 — End: 2017-07-10
  Administered 2017-07-10: 15 g via ORAL
  Filled 2017-07-10: qty 60

## 2017-07-10 MED ORDER — FUROSEMIDE 10 MG/ML IJ SOLN
40.0000 mg | Freq: Once | INTRAMUSCULAR | Status: AC
Start: 2017-07-10 — End: 2017-07-10
  Administered 2017-07-10: 40 mg via INTRAVENOUS
  Filled 2017-07-10: qty 4

## 2017-07-10 MED ORDER — ISOSORBIDE MONONITRATE ER 30 MG PO TB24: 30.0000 mg | ORAL_TABLET | Freq: Every day | ORAL | 0 refills | Status: DC

## 2017-07-10 MED ORDER — AMMONIUM LACTATE 12 % EX CREA: 1.0000 g | TOPICAL_CREAM | Freq: Every day | CUTANEOUS | 0 refills | Status: DC | PRN

## 2017-07-10 MED ORDER — ALBUTEROL SULFATE HFA 108 (90 BASE) MCG/ACT IN AERS: 1.0000 | INHALATION_SPRAY | Freq: Four times a day (QID) | RESPIRATORY_TRACT | 0 refills | Status: DC | PRN

## 2017-07-10 MED ORDER — KETOCONAZOLE 2 % EX SHAM: 1.0000 "application " | MEDICATED_SHAMPOO | CUTANEOUS | 0 refills | Status: DC

## 2017-07-10 MED ORDER — SODIUM BICARBONATE 8.4 % IV SOLN
50.0000 meq | Freq: Once | INTRAVENOUS | Status: AC
  Administered 2017-07-10: 50 meq via INTRAVENOUS
  Filled 2017-07-10: qty 50

## 2017-07-10 MED ORDER — FOLIC ACID 1 MG PO TABS: 1.0000 mg | ORAL_TABLET | Freq: Every day | ORAL | 0 refills | Status: DC

## 2017-07-10 MED ORDER — SODIUM POLYSTYRENE SULFONATE 15 GM/60ML PO SUSP
30.0000 g | Freq: Once | ORAL | Status: AC
Start: 2017-07-10 — End: 2017-07-10
  Administered 2017-07-10: 30 g via RECTAL
  Filled 2017-07-10: qty 120

## 2017-07-10 MED ORDER — FUROSEMIDE 20 MG PO TABS: 20.0000 mg | ORAL_TABLET | Freq: Two times a day (BID) | ORAL | 0 refills | Status: DC

## 2017-07-10 MED ORDER — NYSTATIN 100000 UNIT/GM EX CREA: TOPICAL_CREAM | CUTANEOUS | 0 refills | Status: DC

## 2017-07-10 MED ORDER — EUCERIN EX LOTN: 5.0000 mL | TOPICAL_LOTION | Freq: Two times a day (BID) | CUTANEOUS | 0 refills | Status: DC | PRN

## 2017-07-10 MED ORDER — SODIUM CHLORIDE 0.9 % IV SOLN
1.0000 g | Freq: Once | INTRAVENOUS | Status: AC
  Administered 2017-07-10: 1 g via INTRAVENOUS
  Filled 2017-07-10: qty 10

## 2017-07-10 NOTE — Progress Notes (Signed)
WEnt over paperwork with patient.  All questions answered.  VSS.  Portable 02 delivered to room.  Bus pass given. AVS and prescriptions given to patient.

## 2017-07-10 NOTE — ED Triage Notes (Signed)
Pt reports that she was discharged from hospital here while ago and when she was at bus stop she fell out of her wheelchair and hit her head.

## 2017-07-10 NOTE — ED Provider Notes (Addendum)
[facility] DEPT Provider Note   CSN: [id number] Arrival date & time: 07/10/17  1431     History   Chief Complaint Chief Complaint  Patient presents with  . Fall  . Headache    HPI KEMBA HOPPES is a 52 y.o. female history of obesity, CHF, CKD, here presented with shortness of breath, near syncope.  She was admitted to the hospital for CHF exacerbation and received IV Lasix and was diuresed successfully.  However, because her renal failure to slightly worsen so she was given 500 cc of IV fluids this morning.  Patient was waiting at the bus stop with her oxygen and she told me that she felt short of breath and apparently fell out of the wheelchair and hit her head.  Unclear if she passed out or not.  Patient complains of some headaches and chest pain and shortness of breath currently.   The history is provided by the patient.    Past Medical History:  Diagnosis Date  . Allergy   . Anxiety   . Arthritis   . Asthma   . Asthma   . CHF (congestive heart failure) ([place])   . CHF (congestive heart failure) ([place])   . Chronic abdominal pain   . Chronic kidney disease   . Chronic pain    "all over"  . 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 GERD (gastroesophageal reflux disease)   . Gout   . Hidradenitis   . HLD (hyperlipidemia) 12/14/2015  . Homelessness   . Hyperlipemia   . Hypertension   . Low back pain   . Morbid obesity ([place])    uses motor wheel chair  . MRSA (methicillin resistant Staphylococcus aureus)    states about a year ago  . On home O2    3L N/C O2 continuously  . OSA (obstructive sleep apnea)    non-compliant with CPAP  . Oxygen deficiency   . Renal insufficiency   . Sleep apnea     Patient Active Problem List   Diagnosis Date Noted  . Swelling 07/07/2017  . Hypoxia 01/22/2017  . Acute pulmonary edema ([place]) 12/14/2015  . Essential hypertension 12/14/2015  . HLD (hyperlipidemia) 12/14/2015  . GERD  (gastroesophageal reflux disease) 12/14/2015  . Gout 12/14/2015  . Acute on chronic diastolic (congestive) heart failure ([place]) 12/14/2015  . Chest pain 12/14/2015  . Shortness of breath 06/09/2015  . Atypical chest pain   . Darier's disease   . Leukocytosis   . 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 Acute bronchitis 11/20/2014  . Right-sided heart failure ([place]) 11/20/2014  . OSA (obstructive sleep apnea)   . CKD (chronic kidney disease) stage 3, GFR 30-59 ml/min (HCC) 11/17/2014  . Asthma exacerbation 11/16/2014  . Venous stasis 11/16/2014  . Acute on chronic respiratory failure with hypoxia ([place]) 11/16/2014  . Hypotension 08/08/2012  . Chronic pain 08/24/2011  . Morbid obesity ([place]) 08/24/2011    Past Surgical History:  Procedure Laterality Date  . BACK SURGERY    . CESAREAN SECTION    . CESAREAN SECTION    . FRACTURE SURGERY    . TUBAL LIGATION       OB History    Gravida  0   Para  0   Term  0   Preterm  0   AB  0   Living        SAB  0   TAB  0   Ectopic  0   Multiple      Live Births               Home Medications    Prior to Admission medications   Medication Sig Start Date End Date Taking? Authorizing Provider  albuterol (PROVENTIL HFA;VENTOLIN HFA) 108 (90 Base) MCG/ACT inhaler Inhale 1-2 puffs into the lungs every 6 (six) hours as needed for wheezing or shortness of breath. 07/10/17   Doreatha Lew, MD  allopurinol (ZYLOPRIM) 100 MG tablet Take 1 tablet (100 mg total) by mouth every morning. 01/22/14   Donne Hazel, MD  ALPRAZolam Duanne Moron) 1 MG tablet Take 1 mg by mouth 3 (three) times daily as needed for anxiety (anxiety).     [provider]  ammonium lactate (AMLACTIN) 12 % cream Apply 1 g topically daily as needed for dry skin. 07/10/17   Doreatha Lew, MD  aspirin 325 MG tablet Take 325 mg by mouth every morning.     [provider]  atorvastatin (LIPITOR) 20 MG tablet Take 1 tablet (20 mg total) by mouth every  morning. 06/11/15   Lavina Hamman, MD  benzonatate (TESSALON) 100 MG capsule Take 100 mg by mouth 3 (three) times daily as needed for cough.    [provider]  budesonide-formoterol (SYMBICORT) 160-4.5 MCG/ACT inhaler Inhale 2 puffs into the lungs 2 (two) times daily. 11/21/14   Debbe Odea, MD  butalbital-acetaminophen-caffeine (FIORICET, ESGIC) [phone] MG tablet Take 1 tablet by mouth every 6 (six) hours as needed for headache. 06/11/15   Lavina Hamman, MD  dextromethorphan-guaiFENesin [facility] DM) 30-600 MG 12hr tablet Take 1 tablet by mouth 2 (two) times daily. 06/11/15   Lavina Hamman, MD  Emollient (EUCERIN) lotion Apply 5 mLs topically 2 (two) times daily as needed for dry skin. 07/10/17   Doreatha Lew, MD  folic acid (FOLVITE) 1 MG tablet Take 1 tablet (1 mg total) by mouth daily. 07/10/17   Doreatha Lew, MD  furosemide (LASIX) 20 MG tablet Take 1 tablet (20 mg total) by mouth 2 (two) times daily. 07/11/17   Doreatha Lew, MD  gabapentin (NEURONTIN) 100 MG capsule Take 1 capsule (100 mg total) by mouth 2 (two) times daily. 07/08/17   Doreatha Lew, MD  HYDROcodone-acetaminophen [facility]) 10-325 MG tablet Take 1 tablet by mouth every 4 (four) hours as needed for moderate pain. 12/24/15   Hosie Poisson, MD  hydrOXYzine (ATARAX/VISTARIL) 25 MG tablet Take 25 mg by mouth 3 (three) times daily as needed for anxiety or itching (anxiety and itching).     [provider]  ipratropium-albuterol (DUONEB) 0.5-2.5 (3) MG/3ML SOLN Take 3 mLs by nebulization every 6 (six) hours as needed. Patient taking differently: Take 3 mLs by nebulization every 6 (six) hours as needed (SOB).  12/24/15   Hosie Poisson, MD  isosorbide mononitrate (IMDUR) 30 MG 24 hr tablet Take 1 tablet (30 mg total) by mouth daily. 07/11/17   Doreatha Lew, MD  ketoconazole (NIZORAL) 2 % shampoo Apply 1 application topically 2 (two) times a week. 07/13/17   Doreatha Lew, MD  nystatin  cream (MYCOSTATIN) Apply to affected area 2 times daily 07/10/17   Patrecia Pour, Christean Grief, MD  Omega-3 Fatty Acids (FISH OIL PO) Take 1 capsule by mouth 2 (two) times daily.    [provider]  pantoprazole (PROTONIX) 40 MG tablet Take 1 tablet (40 mg total) by mouth every morning. 06/11/15   Lavina Hamman, MD  solifenacin (VESICARE) 5 MG tablet Take 1 tablet (5 mg total) by mouth every morning. 07/08/17   Doreatha Lew, MD  Vitamin D, Ergocalciferol, (DRISDOL) 50000 units CAPS capsule Take 1 capsule (50,000 Units total) by mouth once a week. 07/08/17   Doreatha Lew, MD  diphenhydrAMINE (SOMINEX) 25 MG tablet Take 25 mg by mouth at bedtime as needed.  06/14/11  [provider]    Family History Family History  Problem Relation Age of Onset  . Asthma Mother   . Diabetes Father   . Stroke Father   . Heart disease Father   . Arthritis Sister   . Asthma Sister   . Asthma Daughter   . Asthma Son   . Arthritis Sister   . Asthma Sister     Social History Social History   Tobacco Use  . Smoking status: Former Smoker    Types: Cigarettes    Last attempt to quit: 03/25/2003    Years since quitting: 14.3  . Smokeless tobacco: Former Systems developer    Quit date: 05/27/1978  Substance Use Topics  . Alcohol use: Yes    Alcohol/week: 0.0 oz    Comment: years ago  no longer beer only  early 20's  . Drug use: Yes    Types: "Crack" cocaine, Other-see comments    Comment: years  ago     Allergies   Ibuprofen; Penicillins; Sulfa antibiotics; Adhesive [tape]; Doxycycline; Sulfamethoxazole-trimethoprim; and Zithromax [azithromycin]   Review of Systems Review of Systems  Respiratory: Positive for shortness of breath.   Neurological: Positive for headaches.  All other systems reviewed and are negative.    Physical Exam Updated Vital Signs BP (!) 105/57   Pulse 80   Temp 97.9 F (36.6 C) (Oral)   Resp 20   LMP 03/24/2009   SpO2 93%   Physical Exam  Constitutional:  She is oriented to person, place, and time.  Obese, slightly tachypneic   HENT:  Head: Normocephalic.  Mouth/Throat: Oropharynx is clear and moist.  Eyes: Pupils are equal, round, and reactive to light. EOM are normal.  Neck: Normal range of motion.  Cardiovascular: Normal rate.  Pulmonary/Chest:  Slightly tachypneic, diminished bilateral bases   Abdominal: Soft. Bowel sounds are normal.  Neurological: She is alert and oriented to person, place, and time. She has normal strength.  Skin: Skin is warm and dry.  Psychiatric: She has a normal mood and affect. Her behavior is normal.  Nursing note and vitals reviewed.    ED Treatments / Results  Labs (all labs ordered are listed, but only abnormal results are displayed) Labs Reviewed  CBC WITH DIFFERENTIAL/PLATELET - Abnormal; Notable for the following components:      Result Value   RBC 3.82 (*)    Hemoglobin 11.7 (*)    All other components within normal limits  BLOOD GAS, VENOUS - Abnormal; Notable for the following components:   pH, Ven 7.240 (*)    pCO2, Ven 82.9 (*)    pO2, Ven 47.7 (*)    Bicarbonate 34.3 (*)    Acid-Base Excess 4.8 (*)    All other components within normal limits  I-STAT CHEM 8, ED - Abnormal; Notable for the following components:   Potassium 6.1 (*)    Chloride 98 (*)    BUN 41 (*)    Creatinine, Ser 2.10 (*)    Glucose, Bld 107 (*)    Calcium, Ion 1.08 (*)    TCO2 34 (*)    All  other components within normal limits  BRAIN NATRIURETIC PEPTIDE  COMPREHENSIVE METABOLIC PANEL  I-STAT TROPONIN, ED    EKG EKG Interpretation  Date/Time:  Friday July 10 2017 21:22:04 EDT Ventricular Rate:  78 PR Interval:    QRS Duration: 105 QT Interval:  376 QTC Calculation: 429 R Axis:   80 Text Interpretation:  Sinus rhythm Low voltage, precordial leads No significant change since last tracing Confirmed by Wandra Arthurs [phone]) on 07/10/2017 9:25:59 PM   Radiology Dg Chest 2 View  Result Date:  07/10/2017 CLINICAL DATA:  Short of breath after a fall. EXAM: CHEST - 2 VIEW COMPARISON:  07/06/2017 FINDINGS: Both views are degraded by patient body habitus. Frontal view is oblique, apical lordotic positioning. Cardiomegaly accentuated by AP portable technique. No definite pleural fluid. No pneumothorax. Low lung volumes with resultant pulmonary interstitial prominence. No well-defined lobar consolidation. Lung bases not well evaluated, especially on the frontal radiograph. IMPRESSION: Moderately degraded exam, as detailed above. Cardiomegaly and low lung volumes.  No definite acute disease. Electronically Signed   By: Abigail Miyamoto M.D.   On: 07/10/2017 20:03   Ct Head Wo Contrast  Result Date: 07/10/2017 CLINICAL DATA:  Patient fell out of wheelchair hitting her head. EXAM: CT HEAD WITHOUT CONTRAST CT CERVICAL SPINE WITHOUT CONTRAST TECHNIQUE: Multidetector CT imaging of the head and cervical spine was performed following the standard protocol without intravenous contrast. Multiplanar CT image reconstructions of the cervical spine were also generated. COMPARISON:  Head CT 01/22/2017 FINDINGS: CT HEAD FINDINGS Limited due to streak artifacts from the patient's shoulders and patient body habitus. Brain: No acute intracranial hemorrhage, edema, midline shift nor extra-axial fluid collections noted. No hydrocephalus. No large vascular territory infarct. Vascular: No hyperdense vessel sign. Skull: No skull fracture. Sinuses/Orbits: No acute finding. Other: None CT CERVICAL SPINE FINDINGS Limited by body habitus, motion and shoulder artifacts. Alignment: Mild straightening of cervical lordosis.  No listhesis. Skull base and vertebrae: No acute fracture identified. Soft tissues and spinal canal: Mild prominence of prevertebral soft tissues likely related to patient body habitus. No visible canal hematoma. Disc levels: Multilevel degenerative disc disease with uncovertebral joint osteoarthritis C3 through T1.  Multilevel degenerative facet arthropathy. Upper chest: No dominant mass Other: None IMPRESSION: Limited study due to patient body habitus and motion artifacts. No acute intracranial nor cervical spine abnormality. Cervical spondylosis. Electronically Signed   By: Ashley Royalty M.D.   On: 07/10/2017 20:14   Ct Cervical Spine Wo Contrast  Result Date: 07/10/2017 CLINICAL DATA:  Patient fell out of wheelchair hitting her head. EXAM: CT HEAD WITHOUT CONTRAST CT CERVICAL SPINE WITHOUT CONTRAST TECHNIQUE: Multidetector CT imaging of the head and cervical spine was performed following the standard protocol without intravenous contrast. Multiplanar CT image reconstructions of the cervical spine were also generated. COMPARISON:  Head CT 01/22/2017 FINDINGS: CT HEAD FINDINGS Limited due to streak artifacts from the patient's shoulders and patient body habitus. Brain: No acute intracranial hemorrhage, edema, midline shift nor extra-axial fluid collections noted. No hydrocephalus. No large vascular territory infarct. Vascular: No hyperdense vessel sign. Skull: No skull fracture. Sinuses/Orbits: No acute finding. Other: None CT CERVICAL SPINE FINDINGS Limited by body habitus, motion and shoulder artifacts. Alignment: Mild straightening of cervical lordosis.  No listhesis. Skull base and vertebrae: No acute fracture identified. Soft tissues and spinal canal: Mild prominence of prevertebral soft tissues likely related to patient body habitus. No visible canal hematoma. Disc levels: Multilevel degenerative disc disease with uncovertebral joint osteoarthritis C3 through T1.  Multilevel degenerative facet arthropathy. Upper chest: No dominant mass Other: None IMPRESSION: Limited study due to patient body habitus and motion artifacts. No acute intracranial nor cervical spine abnormality. Cervical spondylosis. Electronically Signed   By: Ashley Royalty M.D.   On: 07/10/2017 20:14    Procedures Procedures (including critical care  time)  CRITICAL CARE Performed by: Wandra Arthurs   Total critical care time:30 minutes  Critical care time was exclusive of separately billable procedures and treating other patients.  Critical care was necessary to treat or prevent imminent or life-threatening deterioration.  Critical care was time spent personally by me on the following activities: development of treatment plan with patient and/or surrogate as well as nursing, discussions with consultants, evaluation of patient's response to treatment, examination of patient, obtaining history from patient or surrogate, ordering and performing treatments and interventions, ordering and review of laboratory studies, ordering and review of radiographic studies, pulse oximetry and re-evaluation of patient's condition.  Angiocath insertion Performed by: Wandra Arthurs  Consent: Verbal consent obtained. Risks and benefits: risks, benefits and alternatives were discussed Time out: Immediately prior to procedure a "time out" was called to verify the correct patient, procedure, equipment, support staff and site/side marked as required.  Preparation: Patient was prepped and draped in the usual sterile fashion.  Vein Location: L antecube  Ultrasound Guided  Gauge: 20 long   Normal blood return and flush without difficulty Patient tolerance: Patient tolerated the procedure well with no immediate complications.     Medications Ordered in ED Medications  furosemide (LASIX) injection 40 mg (has no administration in time range)     Initial Impression / Assessment and Plan / ED Course  I have reviewed the triage vital signs and the nursing notes.  Pertinent labs & imaging results that were available during my care of the patient were reviewed by me and considered in my medical decision making (see chart for details).    SHEY YOTT is a 52 y.o. female here with SOB, near syncope. Just got discharged from the hospital this morning  and had worsening SOB at the bus stop. I reviewed records from the hospitalization. Patient did have mild AKI from diuresis. Patient also was set up for home oxygen. Since she had head injury after fall, will get labs, CT head. She occasionally dropped her O2 to 80% despite being on 3 L [place] so will get abg.   9:33 PM Patient refused abg but vbg showed pH 7.24, CO2 89. CXR showed no pulmonary edema. I suspect that she needs more diuresis. Will start on bipap for respiratory failure with hypoxia likely from CHF exacerbation. Given lasix in the ED as well.    Final Clinical Impressions(s) / ED Diagnoses   Final diagnoses:  None    ED Discharge Orders    None       Drenda Freeze, MD 07/10/17 2134    Drenda Freeze, MD 07/10/17 2134

## 2017-07-10 NOTE — Clinical Social Work Note (Signed)
Clinical Social Work Assessment  Patient Details  Name: Kara Vincent MRN: [id number] Date of Birth: 07/14/1965  Date of referral:  07/10/17               Reason for consult:                   Permission sought to share information with:  Family Supports Permission granted to share information::  Yes, Verbal Permission Granted  Name::        Agency::     Relationship::     Contact Information:     Housing/Transportation Living arrangements for the past 2 months:  Merchant navy officer) Source of Information:  Patient(Pt's friend Education officer, environmental) Patient Interpreter Needed:  None Criminal Activity/Legal Involvement Pertinent to Current Situation/Hospitalization:    Significant Relationships:  Significant Other, Parents, Other Family Members Lives with:  (Caregiver ) Do you feel safe going back to the place where you live?  Yes Need for family participation in patient care:  No (Coment)  Care giving concerns:  Per pt her uncle can be reached at ph: [phone]. CSW called this number which rang and there was no VM.  CSW called pt's "friend" Thresa Ross at ph: [phone] and left a HIPPA-compliant VM requesting a call back.  CSW called pt's father Mills Koller at ph: [phone] and  left a HIPPA-compliant VM requesting a call back.  CSW called pt's friend B.B at ph: [phone] phone was busy.  CSW will continue to call.  CSW called pt's father Mills Koller at ph: [phone] and  left a HIPPA-compliant VM requesting a call back.  CSW called pt's friend Robin Searing at ph: [phone] and reached the [place] asked for the pt's friend's room and was connected but there was no answer.  CSW continued to call but still received no answer.  Of note: CSW is familiar with this pt and in the past the pt's boyfriend at the [facility] has picked up the pt's wheelchair by car.  CSW spoke to the pt's friend Colin Broach at ph: [phone] who stated he would be glad to come to the  [facility] ED tomorrow and ride with the pt back to her motel on the bus.  Pt's friend Colin Broach asked the CSW to have the pt call him and coordinate, but that Colin Broach could not come tonight.  Per pt's friend pt resides in the [facility] at:  [address] [place], [place] [address] Room [phone] on the fourth floor.  Per pt's friend pt has an elevator neat her room on the 4th floor.  Social Worker assessment / plan:  CSW met with pt and could not confirm pt's plan to be discharged tfor D/C, pt could not articulate well with her CPAP mask. Pt has been living dependently with her caregiver Robin Searing at: ph:[phone]  prior to being admitted to [facility] ED.   Employment status:  Retired Forensic scientist:  [place]icaid [place] PT Recommendations:  Not assessed at this time Information / Referral to community resources:     Patient/Family's Response to care:  Patient alert and oriented but unable to be understood for the most part due to SOB and the CPAP mask.    Patient/Family's Understanding of and Emotional Response to Diagnosis, Current Treatment, and Prognosis:  Still assessing   Emotional Assessment Appearance:  Appears stated age Attitude/Demeanor/Rapport:    Affect (typically observed):  Stoic, Withdrawn, Flat, Constricted Orientation:  Oriented to Self, Fluctuating Orientation (Suspected and/or reported Sundowners),  Oriented to Place, Oriented to  Time, Oriented to Situation Alcohol / Substance use:    Psych involvement (Current and /or in the community):     Discharge Needs  Concerns to be addressed:  No discharge needs identified Readmission within the last 30 days:  Yes Current discharge risk:  Dependent with Mobility Barriers to Discharge:  No Barriers Identified   Claudine Mouton, LCSWA 07/10/2017, 10:38 PM

## 2017-07-10 NOTE — Progress Notes (Signed)
CSW spoke to the pt's friend Colin Broach at ph: [phone] who stated he would be glad to come to the [facility] ED tomorrow and ride with the pt back to her motel on the bus.  Pt's friend Colin Broach asked the CSW to have the pt call him and coordinate, but that Colin Broach could not come tonight.  Per pt's friend pt resides in the [facility] at:  [address] [place], [place] [address] Room [phone] on the fourth floor.  Per pt's friend pt has an elevator neat her room on the 4th floor.  CSW will continue to follow for D/C needs.  Alphonse Guild. Steadman Prosperi, LCSW, LCAS, CSI Clinical Social Worker Ph: [phone]

## 2017-07-10 NOTE — Progress Notes (Addendum)
CSW received consult from EDP. Pt was at the bus stop after D/C'ing and per the pt she fell out of the wheelchair and hit her head.    CSW assess pt for family contacts who can give pt a ride before looking at voucher options.    8:37 PM CSW spoke to pt who stated she was taking the but and fell out of her wheelchair.  CSW asked how she could get the wheelchair home if the pt were to take a taxi and pt stated,  "I can't".  When asked pt continually maintained her family had their own problems and could not help, her, that her father refused to talk to her or provide her his phone number and that she has no other number beside her uncle.  Per pt her uncle can be reached at ph: [phone]. CSW called this number which rang and there was no VM.  CSW called pt's "friend" Thresa Ross at ph: [phone] and left a HIPPA-compliant VM requesting a call back.  CSW called pt's father Mills Koller at ph: [phone] and  left a HIPPA-compliant VM requesting a call back.  CSW called pt's friend B.B at ph: [phone] phone was busy.  CSW will continue to call.  CSW called pt's father Mills Koller at ph: [phone] and  left a HIPPA-compliant VM requesting a call back.  CSW called pt's friend Robin Searing at ph: [phone] and reached the [place] asked for the pt's friend's room and was connected but there was no answer.  CSW continued to call but still received no answer.  Of note: CSW is familiar with this pt and in the past the pt's boyfriend at the [facility] has picked up the pt's wheelchair by car.  CSW will continue to follow for D/C needs.  Alphonse Guild. Annissa Andreoni, LCSW, LCAS, CSI Clinical Social Worker Ph: [phone]

## 2017-07-10 NOTE — ED Notes (Signed)
CRITICAL VALUE STICKER  CRITICAL VALUE: K+ 6.4  RECEIVER (on-site recipient of call): Jake T   DATE & TIME NOTIFIED: 942p  MESSENGER (representative from lab): Philis Nettle  MD NOTIFIED: Maudie Mercury MD  TIME OF NOTIFICATION: 942p  RESPONSE: see orders

## 2017-07-10 NOTE — ED Notes (Signed)
Patient called for room placement x1 with no answer. 

## 2017-07-10 NOTE — H&P (Signed)
TRH H&P   Patient Demographics:    Yari Szeliga, is a 52 y.o. female  MRN: [id number]   DOB - [birth year]  Admit Date - 07/10/2017  Outpatient Primary MD for the patient is Patient, No Pcp Per  Referring MD/NP/PA: Shirlyn Goltz  Outpatient Specialists:     Patient coming from:    Chief Complaint  Patient presents with  . Fall  . Headache      HPI:    Blakelynn Scheeler  is a 52 y.o. female, w Asthma, OSA, OHS, CHF (EF nl), mild Aortic stenosis, CKD stage 3, apparently recently admitted for diastolic CHF was discharged and was waiting at the bus stop with her o2 when she felt dyspneic and apparently fell out of her wheel chair and ? Hit her head.  Unclear if she passed out or not.   In Ed,  CXR IMPRESSION: Moderately degraded exam, as detailed above.  Cardiomegaly and low lung volumes.  No definite acute disease.  CT brain IMPRESSION: Limited study due to patient body habitus and motion artifacts. No acute intracranial nor cervical spine abnormality. Cervical spondylosis.  Wbc 9.0, Hgb 11.7, Plt 194 Na 140, K 6.4, Bun 44, Creatinnie 2.19 Ast 21, Alt 14 Alk phos 63, T. Bili 0.6  BNP 12.5  Glucose 109  Trop 0.00  Ph 7.240, Pco2 82.9  po2 47.7 (venous)  Pt will be admitted for hyperkalemia and Acute respiratory failure (hypercapneic).       Review of systems:    In addition to the HPI above,   No Fever-chills, No Headache, No changes with Vision or hearing, No problems swallowing food or Liquids, No Chest pain, No Cough  No Abdominal pain, No Nausea or Vommitting, Bowel movements are regular, No Blood in stool or Urine, No dysuria, No new skin rashes or bruises, No new joints pains-aches,  No new weakness, tingling, numbness in any extremity, No recent weight gain or loss, No polyuria, polydypsia or polyphagia, No significant Mental Stressors.  A  full 10 point Review of Systems was done, except as stated above, all other Review of Systems were negative.   With Past History of the following :    Past Medical History:  Diagnosis Date  . Allergy   . Anxiety   . Arthritis   . Asthma   . Asthma   . CHF (congestive heart failure) ([place])   . CHF (congestive heart failure) ([place])   . Chronic abdominal pain   . Chronic kidney disease   . Chronic pain    "all over"  . 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 GERD (gastroesophageal reflux disease)   . Gout   . Hidradenitis   . HLD (hyperlipidemia) 12/14/2015  . Homelessness   . Hyperlipemia   . Hypertension   . Low back pain   . Morbid obesity ([place])  uses motor wheel chair  . MRSA (methicillin resistant Staphylococcus aureus)    states about a year ago  . On home O2    3L N/C O2 continuously  . OSA (obstructive sleep apnea)    non-compliant with CPAP  . Oxygen deficiency   . Renal insufficiency   . Sleep apnea       Past Surgical History:  Procedure Laterality Date  . BACK SURGERY    . CESAREAN SECTION    . CESAREAN SECTION    . FRACTURE SURGERY    . TUBAL LIGATION        Social History:     Social History   Tobacco Use  . Smoking status: Former Smoker    Types: Cigarettes    Last attempt to quit: 03/25/2003    Years since quitting: 14.3  . Smokeless tobacco: Former Systems developer    Quit date: 05/27/1978  Substance Use Topics  . Alcohol use: Yes    Alcohol/week: 0.0 oz    Comment: years ago  no longer beer only  early 20's     Lives - at home  Mobility - unclear   Family History :     Family History  Problem Relation Age of Onset  . Asthma Mother   . Diabetes Father   . Stroke Father   . Heart disease Father   . Arthritis Sister   . Asthma Sister   . Asthma Daughter   . Asthma Son   . Arthritis Sister   . Asthma Sister       Home Medications:   Prior to Admission medications   Medication Sig Start Date End Date Taking?  Authorizing Provider  albuterol (PROVENTIL HFA;VENTOLIN HFA) 108 (90 Base) MCG/ACT inhaler Inhale 1-2 puffs into the lungs every 6 (six) hours as needed for wheezing or shortness of breath. 07/10/17   Doreatha Lew, MD  allopurinol (ZYLOPRIM) 100 MG tablet Take 1 tablet (100 mg total) by mouth every morning. 01/22/14   Donne Hazel, MD  ALPRAZolam Duanne Moron) 1 MG tablet Take 1 mg by mouth 3 (three) times daily as needed for anxiety (anxiety).     [provider]  ammonium lactate (AMLACTIN) 12 % cream Apply 1 g topically daily as needed for dry skin. 07/10/17   Doreatha Lew, MD  aspirin 325 MG tablet Take 325 mg by mouth every morning.     [provider]  atorvastatin (LIPITOR) 20 MG tablet Take 1 tablet (20 mg total) by mouth every morning. 06/11/15   Lavina Hamman, MD  benzonatate (TESSALON) 100 MG capsule Take 100 mg by mouth 3 (three) times daily as needed for cough.    [provider]  budesonide-formoterol (SYMBICORT) 160-4.5 MCG/ACT inhaler Inhale 2 puffs into the lungs 2 (two) times daily. 11/21/14   Debbe Odea, MD  butalbital-acetaminophen-caffeine (FIORICET, ESGIC) [phone] MG tablet Take 1 tablet by mouth every 6 (six) hours as needed for headache. 06/11/15   Lavina Hamman, MD  dextromethorphan-guaiFENesin [facility]) 30-600 MG 12hr tablet Take 1 tablet by mouth 2 (two) times daily. 06/11/15   Lavina Hamman, MD  Emollient (EUCERIN) lotion Apply 5 mLs topically 2 (two) times daily as needed for dry skin. 07/10/17   Doreatha Lew, MD  folic acid (FOLVITE) 1 MG tablet Take 1 tablet (1 mg total) by mouth daily. 07/10/17   Doreatha Lew, MD  furosemide (LASIX) 20 MG tablet Take 1 tablet (20 mg total) by mouth 2 (  two) times daily. 07/11/17   Doreatha Lew, MD  gabapentin (NEURONTIN) 100 MG capsule Take 1 capsule (100 mg total) by mouth 2 (two) times daily. 07/08/17   Doreatha Lew, MD  HYDROcodone-acetaminophen [facility]) 10-325 MG  tablet Take 1 tablet by mouth every 4 (four) hours as needed for moderate pain. 12/24/15   Hosie Poisson, MD  hydrOXYzine (ATARAX/VISTARIL) 25 MG tablet Take 25 mg by mouth 3 (three) times daily as needed for anxiety or itching (anxiety and itching).     [provider]  ipratropium-albuterol (DUONEB) 0.5-2.5 (3) MG/3ML SOLN Take 3 mLs by nebulization every 6 (six) hours as needed. Patient taking differently: Take 3 mLs by nebulization every 6 (six) hours as needed (SOB).  12/24/15   Hosie Poisson, MD  isosorbide mononitrate (IMDUR) 30 MG 24 hr tablet Take 1 tablet (30 mg total) by mouth daily. 07/11/17   Doreatha Lew, MD  ketoconazole (NIZORAL) 2 % shampoo Apply 1 application topically 2 (two) times a week. 07/13/17   Doreatha Lew, MD  nystatin cream (MYCOSTATIN) Apply to affected area 2 times daily 07/10/17   Patrecia Pour, Christean Grief, MD  Omega-3 Fatty Acids (FISH OIL PO) Take 1 capsule by mouth 2 (two) times daily.    [provider]  pantoprazole (PROTONIX) 40 MG tablet Take 1 tablet (40 mg total) by mouth every morning. 06/11/15   Lavina Hamman, MD  solifenacin (VESICARE) 5 MG tablet Take 1 tablet (5 mg total) by mouth every morning. 07/08/17   Doreatha Lew, MD  Vitamin D, Ergocalciferol, (DRISDOL) 50000 units CAPS capsule Take 1 capsule (50,000 Units total) by mouth once a week. 07/08/17   Doreatha Lew, MD  diphenhydrAMINE (SOMINEX) 25 MG tablet Take 25 mg by mouth at bedtime as needed.  06/14/11  [provider]     Allergies:     Allergies  Allergen Reactions  . Ibuprofen Hives, Shortness Of Breath, Palpitations and Rash    Has tolerated toradol 12/2013 inpatient  . Penicillins Anaphylaxis, Hives and Rash    Has patient had a PCN reaction causing immediate rash, facial/tongue/throat swelling, SOB or lightheadedness with hypotension: Yes Has patient had a PCN reaction causing severe rash involving mucus membranes or skin necrosis: Yes Has  patient had a PCN reaction that required hospitalization Unknown Has patient had a PCN reaction occurring within the last 10 years: Unknown If all of the above answers are "NO", then may proceed with Cephalosporin use.   . Sulfa Antibiotics Hives, Shortness Of Breath, Rash and Cough  . Adhesive [Tape] Rash  . Doxycycline Hives, Swelling and Rash  . Sulfamethoxazole-Trimethoprim Rash  . Zithromax [Azithromycin] Hives, Swelling and Rash     Physical Exam:   Vitals  Blood pressure (!) 105/57, pulse 80, temperature 97.9 F (36.6 C), temperature source Oral, resp. rate 20, last menstrual period 03/24/2009, SpO2 93 %.   1. General  lying in bed in NAD,   2. Normal affect and insight, Not Suicidal or Homicidal, Awake Alert, Oriented X 3.  3. No F.N deficits, ALL C.Nerves Intact, Strength 5/5 all 4 extremities, Sensation intact all 4 extremities, Plantars down going.  4. Ears and Eyes appear Normal, Conjunctivae clear, PERRLA. Moist Oral Mucosa.  5. Supple Neck, No JVD, No cervical lymphadenopathy appriciated, No Carotid Bruits.  6. Symmetrical Chest wall movement, Good air movement bilaterally, few basilar crackles on wheezing  7. RRR, No Gallops, Rubs or Murmurs, No Parasternal Heave.  8. Positive Bowel Sounds,  Abdomen Soft, No tenderness, No organomegaly appriciated,No rebound -guarding or rigidity.  9.  No Cyanosis, Normal Skin Turgor, No Skin Rash or Bruise.  10. Good muscle tone,  joints appear normal , no effusions, Normal ROM.  11. No Palpable Lymph Nodes in Neck or Axillae      Data Review:    CBC Recent Labs  Lab 07/07/17 0630 07/08/17 0412 07/10/17 2046 07/10/17 2056  WBC 8.1 8.9 9.0  --   HGB 12.1 12.3 11.7* 12.6  HCT 40.2 41.3 38.1 37.0  PLT 192 210 194  --   MCV 100.0 100.0 99.7  --   MCH 30.1 29.8 30.6  --   MCHC 30.1 29.8* 30.7  --   RDW 14.0 14.2 14.2  --   LYMPHSABS 2.4  --  1.5  --   MONOABS 0.4  --  0.6  --   EOSABS 0.2  --  0.3  --     BASOSABS 0.0  --  0.0  --    ------------------------------------------------------------------------------------------------------------------  Chemistries  Recent Labs  Lab 07/07/17 0630 07/08/17 0412 07/09/17 0356 07/10/17 0436 07/10/17 2046 07/10/17 2056  NA 141 141 140 139 140 137  K 5.6* 5.3* 5.2* 5.5* 6.4* 6.1*  CL 102 99* 96* 97* 100* 98*  CO2 28 30 33* 32 32  --   GLUCOSE 99 114* 111* 114* 109* 107*  BUN 37* 35* 43* 44* 44* 41*  CREATININE 1.85* 1.78* 2.27* 2.07* 2.19* 2.10*  CALCIUM 8.6* 8.6* 8.5* 8.6* 8.5*  --   MG  --   --  2.1 2.1  --   --   AST 11* 11*  --   --  21  --   ALT 10* 11*  --   --  14  --   ALKPHOS 53 55  --   --  63  --   BILITOT 0.4 0.7  --   --  0.6  --    ------------------------------------------------------------------------------------------------------------------ estimated creatinine clearance is 52.2 mL/min (A) (by C-G formula based on SCr of 2.1 mg/dL (H)). ------------------------------------------------------------------------------------------------------------------ No results for input(s): TSH, T4TOTAL, T3FREE, THYROIDAB in the last 72 hours.  Invalid input(s): FREET3  Coagulation profile No results for input(s): INR, PROTIME in the last 168 hours. ------------------------------------------------------------------------------------------------------------------- No results for input(s): DDIMER in the last 72 hours. -------------------------------------------------------------------------------------------------------------------  Cardiac Enzymes No results for input(s): CKMB, TROPONINI, MYOGLOBIN in the last 168 hours.  Invalid input(s): CK ------------------------------------------------------------------------------------------------------------------    Component Value Date/Time   BNP 12.5 07/10/2017 2048      ---------------------------------------------------------------------------------------------------------------  Urinalysis    Component Value Date/Time   COLORURINE STRAW (A) 07/07/2017 0902   APPEARANCEUR CLEAR 07/07/2017 0902   LABSPEC 1.012 07/07/2017 0902   PHURINE 6.0 07/07/2017 0902   GLUCOSEU NEGATIVE 07/07/2017 0902   GLUCOSEU NEG mg/dL 10/01/2007 2007   HGBUR NEGATIVE 07/07/2017 0902   HGBUR negative 12/05/2009 1003   BILIRUBINUR NEGATIVE 07/07/2017 0902   BILIRUBINUR small 07/31/2010 1713   KETONESUR NEGATIVE 07/07/2017 0902   PROTEINUR NEGATIVE 07/07/2017 0902   UROBILINOGEN 0.2 12/01/2014 2240   NITRITE NEGATIVE 07/07/2017 0902   LEUKOCYTESUR NEGATIVE 07/07/2017 0902    ----------------------------------------------------------------------------------------------------------------   Imaging Results:    Dg Chest 2 View  Result Date: 07/10/2017 CLINICAL DATA:  Short of breath after a fall. EXAM: CHEST - 2 VIEW COMPARISON:  07/06/2017 FINDINGS: Both views are degraded by patient body habitus. Frontal view is oblique, apical lordotic positioning. Cardiomegaly accentuated by AP portable technique. No definite pleural fluid. No  pneumothorax. Low lung volumes with resultant pulmonary interstitial prominence. No well-defined lobar consolidation. Lung bases not well evaluated, especially on the frontal radiograph. IMPRESSION: Moderately degraded exam, as detailed above. Cardiomegaly and low lung volumes.  No definite acute disease. Electronically Signed   By: Abigail Miyamoto M.D.   On: 07/10/2017 20:03   Ct Head Wo Contrast  Result Date: 07/10/2017 CLINICAL DATA:  Patient fell out of wheelchair hitting her head. EXAM: CT HEAD WITHOUT CONTRAST CT CERVICAL SPINE WITHOUT CONTRAST TECHNIQUE: Multidetector CT imaging of the head and cervical spine was performed following the standard protocol without intravenous contrast. Multiplanar CT image reconstructions of the cervical spine  were also generated. COMPARISON:  Head CT 01/22/2017 FINDINGS: CT HEAD FINDINGS Limited due to streak artifacts from the patient's shoulders and patient body habitus. Brain: No acute intracranial hemorrhage, edema, midline shift nor extra-axial fluid collections noted. No hydrocephalus. No large vascular territory infarct. Vascular: No hyperdense vessel sign. Skull: No skull fracture. Sinuses/Orbits: No acute finding. Other: None CT CERVICAL SPINE FINDINGS Limited by body habitus, motion and shoulder artifacts. Alignment: Mild straightening of cervical lordosis.  No listhesis. Skull base and vertebrae: No acute fracture identified. Soft tissues and spinal canal: Mild prominence of prevertebral soft tissues likely related to patient body habitus. No visible canal hematoma. Disc levels: Multilevel degenerative disc disease with uncovertebral joint osteoarthritis C3 through T1. Multilevel degenerative facet arthropathy. Upper chest: No dominant mass Other: None IMPRESSION: Limited study due to patient body habitus and motion artifacts. No acute intracranial nor cervical spine abnormality. Cervical spondylosis. Electronically Signed   By: Ashley Royalty M.D.   On: 07/10/2017 20:14   Ct Cervical Spine Wo Contrast  Result Date: 07/10/2017 CLINICAL DATA:  Patient fell out of wheelchair hitting her head. EXAM: CT HEAD WITHOUT CONTRAST CT CERVICAL SPINE WITHOUT CONTRAST TECHNIQUE: Multidetector CT imaging of the head and cervical spine was performed following the standard protocol without intravenous contrast. Multiplanar CT image reconstructions of the cervical spine were also generated. COMPARISON:  Head CT 01/22/2017 FINDINGS: CT HEAD FINDINGS Limited due to streak artifacts from the patient's shoulders and patient body habitus. Brain: No acute intracranial hemorrhage, edema, midline shift nor extra-axial fluid collections noted. No hydrocephalus. No large vascular territory infarct. Vascular: No hyperdense vessel sign.  Skull: No skull fracture. Sinuses/Orbits: No acute finding. Other: None CT CERVICAL SPINE FINDINGS Limited by body habitus, motion and shoulder artifacts. Alignment: Mild straightening of cervical lordosis.  No listhesis. Skull base and vertebrae: No acute fracture identified. Soft tissues and spinal canal: Mild prominence of prevertebral soft tissues likely related to patient body habitus. No visible canal hematoma. Disc levels: Multilevel degenerative disc disease with uncovertebral joint osteoarthritis C3 through T1. Multilevel degenerative facet arthropathy. Upper chest: No dominant mass Other: None IMPRESSION: Limited study due to patient body habitus and motion artifacts. No acute intracranial nor cervical spine abnormality. Cervical spondylosis. Electronically Signed   By: Ashley Royalty M.D.   On: 07/10/2017 20:14    nsr at 16, nl axis, no peaked T   Assessment & Plan:    Principal Problem:   Hyperkalemia Active Problems:   Acute respiratory failure (HCC)   CKD (chronic kidney disease) stage 3, GFR 30-59 ml/min (HCC)   OSA (obstructive sleep apnea)   Essential hypertension   GERD (gastroesophageal reflux disease)   Gout   Hyperkalemia Calcium gluconate  Sodium Bicarbonate 1 amp iv x1 Kayexalate 30 gm pr x1  Acute respiratory failure (hypercapneic) Bipap as per respiratory protocol  ?  Syncope Tele Trop I q6h x3 Orthostatic bp Carotid ultrasound  Gout Cont Allopurinol  Gerd Cont Protonix  Asthma Cont Albuterol Symbicort=> Breo 1puff qday  OSA  Bipap as above  CHF (diastolic) Cont Lasix  DVT Prophylaxis   Lovenox - SCDs  AM Labs Ordered, also please review Full Orders  Family Communication: Admission, patients condition and plan of care including tests being ordered have been discussed with the patient  who indicate understanding and agree with the plan and Code Status.  Code Status FULL CODE  Likely DC to  home  Condition GUARDED   Consults called:  none  Admission status: observation   Time spent in minutes : 45   Jani Gravel M.D on 07/10/2017 at 9:57 PM  Between 7am to 7pm - Pager - [phone]  . After 7pm go to [email or web address] - password [facility]  [facility] - Office  [phone]

## 2017-07-11 ENCOUNTER — Inpatient Hospital Stay (HOSPITAL_COMMUNITY): Payer: Medicaid Other

## 2017-07-11 ENCOUNTER — Other Ambulatory Visit: Payer: Self-pay

## 2017-07-11 DIAGNOSIS — R55 Syncope and collapse: Secondary | ICD-10-CM

## 2017-07-11 LAB — COMPREHENSIVE METABOLIC PANEL
ALK PHOS: 57 U/L (ref 38–126)
ALT: 14 U/L (ref 14–54)
AST: 21 U/L (ref 15–41)
Albumin: 3.4 g/dL — ABNORMAL LOW (ref 3.5–5.0)
Anion gap: 12 (ref 5–15)
BUN: 39 mg/dL — ABNORMAL HIGH (ref 6–20)
CALCIUM: 8.6 mg/dL — AB (ref 8.9–10.3)
CO2: 33 mmol/L — AB (ref 22–32)
Chloride: 97 mmol/L — ABNORMAL LOW (ref 101–111)
Creatinine, Ser: 1.93 mg/dL — ABNORMAL HIGH (ref 0.44–1.00)
GFR, EST AFRICAN AMERICAN: 33 mL/min — AB (ref >60)
GFR, EST NON AFRICAN AMERICAN: 29 mL/min — AB (ref >60)
Glucose, Bld: 94 mg/dL (ref 65–99)
Potassium: 4.6 mmol/L (ref 3.5–5.1)
SODIUM: 142 mmol/L (ref 135–145)
Total Bilirubin: 0.5 mg/dL (ref 0.3–1.2)
Total Protein: 6.7 g/dL (ref 6.5–8.1)

## 2017-07-11 LAB — BLOOD GAS, ARTERIAL
ACID-BASE EXCESS: 10.2 mmol/L — AB (ref 0.0–2.0)
ACID-BASE EXCESS: 12 mmol/L — AB (ref 0.0–2.0)
BICARBONATE: 37.8 mmol/L — AB (ref 20.0–28.0)
Bicarbonate: 40.8 mmol/L — ABNORMAL HIGH (ref 20.0–28.0)
DRAWN BY: 103701
DRAWN BY: 103701
O2 CONTENT: 3 L/min
O2 CONTENT: 3 L/min
O2 SAT: 95 %
O2 SAT: 94 %
PATIENT TEMPERATURE: 98.6
PATIENT TEMPERATURE: 98.6
PCO2 ART: 79.7 mmHg — AB (ref 32.0–48.0)
PO2 ART: 77.3 mmHg — AB (ref 83.0–108.0)
pCO2 arterial: 68.6 mmHg (ref 32.0–48.0)
pH, Arterial: 7.36 (ref 7.350–7.450)
pH, Arterial: 7.329 — ABNORMAL LOW (ref 7.350–7.450)
pO2, Arterial: 73.6 mmHg — ABNORMAL LOW (ref 83.0–108.0)

## 2017-07-11 LAB — CBC
HCT: 40 % (ref 36.0–46.0)
HEMOGLOBIN: 11.9 g/dL — AB (ref 12.0–15.0)
MCH: 29.9 pg (ref 26.0–34.0)
MCHC: 29.8 g/dL — ABNORMAL LOW (ref 30.0–36.0)
MCV: 100.5 fL — ABNORMAL HIGH (ref 78.0–100.0)
Platelets: 187 10*3/uL (ref 150–400)
RBC: 3.98 MIL/uL (ref 3.87–5.11)
RDW: 14 % (ref 11.5–15.5)
WBC: 8 10*3/uL (ref 4.0–10.5)

## 2017-07-11 LAB — TROPONIN I: Troponin I: <0.03 ng/mL (ref <0.03)

## 2017-07-11 MED ORDER — FUROSEMIDE 40 MG PO TABS
40.0000 mg | ORAL_TABLET | Freq: Two times a day (BID) | ORAL | Status: DC
  Administered 2017-07-11 – 2017-07-13 (×4): 40 mg via ORAL
  Filled 2017-07-11 (×4): qty 1

## 2017-07-11 MED ORDER — SODIUM CHLORIDE 0.9% FLUSH: 3.0000 mL | INTRAVENOUS | Status: DC | PRN

## 2017-07-11 MED ORDER — DARIFENACIN HYDROBROMIDE ER 7.5 MG PO TB24
7.5000 mg | ORAL_TABLET | Freq: Every day | ORAL | Status: DC
  Administered 2017-07-11 – 2017-07-16 (×6): 7.5 mg via ORAL
  Filled 2017-07-11 (×6): qty 1

## 2017-07-11 MED ORDER — HYDROCODONE-ACETAMINOPHEN 10-325 MG PO TABS
1.0000 | ORAL_TABLET | ORAL | Status: DC | PRN
Start: 2017-07-11 — End: 2017-07-13
  Administered 2017-07-11 – 2017-07-13 (×8): 1 via ORAL
  Filled 2017-07-11 (×7): qty 1

## 2017-07-11 MED ORDER — BENZONATATE 100 MG PO CAPS: 100.0000 mg | ORAL_CAPSULE | Freq: Three times a day (TID) | ORAL | Status: DC | PRN

## 2017-07-11 MED ORDER — HYDROXYZINE HCL 25 MG PO TABS
25.0000 mg | ORAL_TABLET | Freq: Three times a day (TID) | ORAL | Status: DC | PRN
  Administered 2017-07-11: 25 mg via ORAL
  Filled 2017-07-11: qty 1

## 2017-07-11 MED ORDER — AMMONIUM LACTATE 12 % EX LOTN
TOPICAL_LOTION | Freq: Every day | CUTANEOUS | Status: DC | PRN
  Filled 2017-07-11 (×2): qty 225

## 2017-07-11 MED ORDER — SODIUM CHLORIDE 0.9 % IV SOLN: 250.0000 mL | INTRAVENOUS | Status: DC | PRN

## 2017-07-11 MED ORDER — ASPIRIN 325 MG PO TABS
325.0000 mg | ORAL_TABLET | Freq: Every morning | ORAL | Status: DC
  Administered 2017-07-11 – 2017-07-16 (×6): 325 mg via ORAL
  Filled 2017-07-11 (×6): qty 1

## 2017-07-11 MED ORDER — FOLIC ACID 1 MG PO TABS
1.0000 mg | ORAL_TABLET | Freq: Every day | ORAL | Status: DC
  Administered 2017-07-11 – 2017-07-16 (×6): 1 mg via ORAL
  Filled 2017-07-11 (×6): qty 1

## 2017-07-11 MED ORDER — ALPRAZOLAM 0.5 MG PO TABS
0.5000 mg | ORAL_TABLET | Freq: Three times a day (TID) | ORAL | Status: DC | PRN
  Administered 2017-07-11 (×2): 0.5 mg via ORAL
  Filled 2017-07-11 (×2): qty 1

## 2017-07-11 MED ORDER — FUROSEMIDE 40 MG PO TABS
20.0000 mg | ORAL_TABLET | Freq: Two times a day (BID) | ORAL | Status: DC
  Administered 2017-07-11: 20 mg via ORAL
  Filled 2017-07-11: qty 1

## 2017-07-11 MED ORDER — SODIUM CHLORIDE 0.9% FLUSH
3.0000 mL | Freq: Two times a day (BID) | INTRAVENOUS | Status: DC
  Administered 2017-07-11 – 2017-07-16 (×12): 3 mL via INTRAVENOUS

## 2017-07-11 MED ORDER — ACETAMINOPHEN 325 MG PO TABS
650.0000 mg | ORAL_TABLET | Freq: Four times a day (QID) | ORAL | Status: DC | PRN
  Administered 2017-07-13 – 2017-07-14 (×3): 650 mg via ORAL
  Filled 2017-07-11 (×3): qty 2

## 2017-07-11 MED ORDER — ATORVASTATIN CALCIUM 20 MG PO TABS
20.0000 mg | ORAL_TABLET | Freq: Every morning | ORAL | Status: DC
  Administered 2017-07-11 – 2017-07-16 (×6): 20 mg via ORAL
  Filled 2017-07-11 (×3): qty 1
  Filled 2017-07-11: qty 2
  Filled 2017-07-11 (×2): qty 1

## 2017-07-11 MED ORDER — VITAMIN D (ERGOCALCIFEROL) 1.25 MG (50000 UNIT) PO CAPS
50000.0000 [IU] | ORAL_CAPSULE | ORAL | Status: DC
  Administered 2017-07-16: 50000 [IU] via ORAL
  Filled 2017-07-11: qty 1

## 2017-07-11 MED ORDER — ALBUTEROL SULFATE HFA 108 (90 BASE) MCG/ACT IN AERS
1.0000 | INHALATION_SPRAY | Freq: Four times a day (QID) | RESPIRATORY_TRACT | Status: DC | PRN
  Filled 2017-07-11: qty 6.7

## 2017-07-11 MED ORDER — PANTOPRAZOLE SODIUM 40 MG PO TBEC
40.0000 mg | DELAYED_RELEASE_TABLET | Freq: Every morning | ORAL | Status: DC
  Administered 2017-07-11 – 2017-07-16 (×6): 40 mg via ORAL
  Filled 2017-07-11 (×6): qty 1

## 2017-07-11 MED ORDER — ALLOPURINOL 100 MG PO TABS
100.0000 mg | ORAL_TABLET | Freq: Every morning | ORAL | Status: DC
  Administered 2017-07-11 – 2017-07-16 (×6): 100 mg via ORAL
  Filled 2017-07-11 (×6): qty 1

## 2017-07-11 MED ORDER — ALBUTEROL SULFATE (2.5 MG/3ML) 0.083% IN NEBU: 2.5000 mg | INHALATION_SOLUTION | Freq: Four times a day (QID) | RESPIRATORY_TRACT | Status: DC | PRN

## 2017-07-11 MED ORDER — ACETAMINOPHEN 650 MG RE SUPP: 650.0000 mg | Freq: Four times a day (QID) | RECTAL | Status: DC | PRN

## 2017-07-11 MED ORDER — BUTALBITAL-APAP-CAFFEINE 50-325-40 MG PO TABS
1.0000 | ORAL_TABLET | Freq: Four times a day (QID) | ORAL | Status: DC | PRN
  Administered 2017-07-12 – 2017-07-15 (×5): 1 via ORAL
  Filled 2017-07-11 (×5): qty 1

## 2017-07-11 MED ORDER — FLUTICASONE FUROATE-VILANTEROL 200-25 MCG/INH IN AEPB
1.0000 | INHALATION_SPRAY | Freq: Every day | RESPIRATORY_TRACT | Status: DC
  Administered 2017-07-11 – 2017-07-16 (×4): 1 via RESPIRATORY_TRACT
  Filled 2017-07-11 (×3): qty 28

## 2017-07-11 MED ORDER — ENOXAPARIN SODIUM 40 MG/0.4ML ~~LOC~~ SOLN
40.0000 mg | SUBCUTANEOUS | Status: DC
  Administered 2017-07-12: 40 mg via SUBCUTANEOUS
  Filled 2017-07-11 (×4): qty 0.4

## 2017-07-11 MED ORDER — GABAPENTIN 100 MG PO CAPS
100.0000 mg | ORAL_CAPSULE | Freq: Two times a day (BID) | ORAL | Status: DC
  Administered 2017-07-11 – 2017-07-16 (×11): 100 mg via ORAL
  Filled 2017-07-11 (×11): qty 1

## 2017-07-11 NOTE — Progress Notes (Signed)
Carotid duplex prelim: no evidence of significant ICA stenosis. Very technically limited study due to body habitus and breathing interference. Landry Mellow, RDMS, RVT

## 2017-07-11 NOTE — Progress Notes (Addendum)
PROGRESS NOTE    Kara Vincent  PJA:[id number] DOB: 14-Aug-1965 DOA: 07/10/2017 PCP: Patient, No Pcp Per   Brief Narrative: Patient is a 52 year old female with past medical history of morbid obesity, asthma, obstructive sleep apnea, obesity hypoventilation syndrome, diastolic CHF, mild aortic stenosis, CKD stage III who presented with the emergency department after  she fell out of wheelchair.  She was apparently waiting at the bus stop with her oxygen on her wheelchair when she fell dyspneic and fell. Patient was found to have hypercarbia as per the ABG on presentation.  Patient was admitted for the management of acute on chronic respiratory failure with hypoxia and hypercarbia.  Assessment & Plan:   Principal Problem:   Hyperkalemia Active Problems:   Acute respiratory failure (HCC)   CKD (chronic kidney disease) stage 3, GFR 30-59 ml/min (HCC)   OSA (obstructive sleep apnea)   Essential hypertension   GERD (gastroesophageal reflux disease)   Gout   Acute on chronic respiratory failure with hypoxia and hypercarbia: Found to have hypercarbia as per the  ABG on presentation.  Started on BiPAP. ABG repeated with improvement in pH but her CO2 remains high and she is a chronic retainer. We will continue BiPAP at least at night. Respiratory status is improved.  History of obstructive sleep apnea/obesity hypoventilation syndrome: Seems to be on CPAP at home but not anymore for unclear reasons.  Will check again tomorrow.. She is on  chronic oxygen therapy at home.  She needs to follow-up with pulmonology as outpatient for possible new sleep study and needsto get CPAP at home.  Syncope: Not sure whether she syncopized.  Troponins have been cycled.  Denies any chest pain. We will request for physical therapy assessment but unsure about the benefit.  She uses wheelchair for ambulation but can also walk sometimes.  Carotid Dopplers ordered ,prelim is negative. Echocardiogram shows normal  left ventricular size and function, possible diastolic dysfunction. Imagings did not show any fracture or dislocation.  CT head without any acute abnormalities.  History of CHF: history of diastolic CHF. was given IV Lasix on presentation.  She is on 20 mg twice daily at home patient has significant bilateral lower extremity edema,.  We will make it 40 mg twice a day .Patient will be educated for fluid restriction to less than 1.5 L a day and salt intake to less than 2 g a day.  Hyperkalemia: Given calcium gluconate, sodium bicarb and Kayexalate on presentation.  Hyperkalemia resolved.  Gout: Continue allopurinol.  GERD: Continue Protonix  History of asthma: Currently not wheezing.  Continue her inhalers  CKD stage 3: Kidney function close to her baseline.  Her baseline creatinine is around 1.5-1.8.  Continue diuresis.    DVT prophylaxis: Lovenox Code Status: Full Family Communication: None present on the bedside Disposition Plan: Likely home in 1-2 days   Consultants: None  Procedures:None  Antimicrobials:None  Subjective: Patient seen and examined at bedside this morning.  [name]  She was alert and oriented.  Respiratory status seemed to have improved.  Objective: Vitals:   07/11/17 1330 07/11/17 1331 07/11/17 1400 07/11/17 1401  BP: (!) 75/60  (!) 88/53 (!) 88/53  Pulse: (!) 118 95  91  Resp: 15 20 18 17   Temp:      TempSrc:      SpO2: 94% 94%  94%    Intake/Output Summary (Last 24 hours) at 07/11/2017 1419 Last data filed at 07/11/2017 0823 Gross per 24 hour  Intake 110 ml  Output 900 ml  Net -790 ml   There were no vitals filed for this visit.  Examination:  General exam: Appears calm and comfortable ,Not in distress, morbidly obese HEENT:PERRL,Oral mucosa moist, Ear/Nose normal on gross exam Respiratory system: Bilateral decreased air entry on the bases Cardiovascular system: S1 & S2 heard, RRR. No JVD, murmurs, rubs, gallops or clicks.  Gastrointestinal  system: Abdomen is nondistended, soft and nontender. No organomegaly or masses felt. Normal bowel sounds heard. Central nervous system: Alert and oriented. No focal neurological deficits. Extremities: 1-2 + edema, no clubbing ,no cyanosis, distal peripheral pulses palpable. Skin: No rashes, lesions or ulcers,no icterus ,no pallor Psychiatry: Judgement and insight appear normal. Mood & affect appropriate.     Data Reviewed: I have personally reviewed following labs and imaging studies  CBC: Recent Labs  Lab 07/07/17 0630 07/08/17 0412 07/10/17 2046 07/10/17 2056 07/11/17 0535  WBC 8.1 8.9 9.0  --  8.0  NEUTROABS 5.1  --  6.7  --   --   HGB 12.1 12.3 11.7* 12.6 11.9*  HCT 40.2 41.3 38.1 37.0 40.0  MCV 100.0 100.0 99.7  --  100.5*  PLT 192 210 194  --  621   Basic Metabolic Panel: Recent Labs  Lab 07/08/17 0412 07/09/17 0356 07/10/17 0436 07/10/17 2046 07/10/17 2056 07/11/17 0535  NA 141 140 139 140 137 142  K 5.3* 5.2* 5.5* 6.4* 6.1* 4.6  CL 99* 96* 97* 100* 98* 97*  CO2 30 33* 32 32  --  33*  GLUCOSE 114* 111* 114* 109* 107* 94  BUN 35* 43* 44* 44* 41* 39*  CREATININE 1.78* 2.27* 2.07* 2.19* 2.10* 1.93*  CALCIUM 8.6* 8.5* 8.6* 8.5*  --  8.6*  MG  --  2.1 2.1  --   --   --    GFR: Estimated Creatinine Clearance: 56.8 mL/min (A) (by C-G formula based on SCr of 1.93 mg/dL (H)). Liver Function Tests: Recent Labs  Lab 07/07/17 0630 07/08/17 0412 07/10/17 2046 07/11/17 0535  AST 11* 11* 21 21  ALT 10* 11* 14 14  ALKPHOS 53 55 63 57  BILITOT 0.4 0.7 0.6 0.5  PROT 6.8 6.8 7.4 6.7  ALBUMIN 3.5 3.5 3.8 3.4*   No results for input(s): LIPASE, AMYLASE in the last 168 hours. No results for input(s): AMMONIA in the last 168 hours. Coagulation Profile: No results for input(s): INR, PROTIME in the last 168 hours. Cardiac Enzymes: Recent Labs  Lab 07/11/17 0535 07/11/17 1035  TROPONINI <0.03 <0.03   BNP (last 3 results) No results for input(s): PROBNP in the last  8760 hours. HbA1C: No results for input(s): HGBA1C in the last 72 hours. CBG: No results for input(s): GLUCAP in the last 168 hours. Lipid Profile: No results for input(s): CHOL, HDL, LDLCALC, TRIG, CHOLHDL, LDLDIRECT in the last 72 hours. Thyroid Function Tests: No results for input(s): TSH, T4TOTAL, FREET4, T3FREE, THYROIDAB in the last 72 hours. Anemia Panel: No results for input(s): VITAMINB12, FOLATE, FERRITIN, TIBC, IRON, RETICCTPCT in the last 72 hours. Sepsis Labs: No results for input(s): PROCALCITON, LATICACIDVEN in the last 168 hours.  Recent Results (from the past 240 hour(s))  MRSA PCR Screening     Status: Abnormal   Collection Time: 07/07/17  5:36 PM  Result Value Ref Range Status   MRSA by PCR POSITIVE (A) NEGATIVE Final    Comment:        The GeneXpert MRSA Assay (FDA approved for NASAL specimens only), is one component  of a comprehensive MRSA colonization surveillance program. It is not intended to diagnose MRSA infection nor to guide or monitor treatment for MRSA infections. RESULT CALLED TO, READ BACK BY AND VERIFIED WITH: SEAGRAVES,A. RN @1942  ON 04.16.19 BY COHEN,K Performed at [facility], [place] [address], [place], [place] [address]          Radiology Studies: Dg Chest 2 View  Result Date: 07/10/2017 CLINICAL DATA:  Short of breath after a fall. EXAM: CHEST - 2 VIEW COMPARISON:  07/06/2017 FINDINGS: Both views are degraded by patient body habitus. Frontal view is oblique, apical lordotic positioning. Cardiomegaly accentuated by AP portable technique. No definite pleural fluid. No pneumothorax. Low lung volumes with resultant pulmonary interstitial prominence. No well-defined lobar consolidation. Lung bases not well evaluated, especially on the frontal radiograph. IMPRESSION: Moderately degraded exam, as detailed above. Cardiomegaly and low lung volumes.  No definite acute disease. Electronically Signed   By: Abigail Miyamoto M.D.   On:  07/10/2017 20:03   Ct Head Wo Contrast  Result Date: 07/10/2017 CLINICAL DATA:  Patient fell out of wheelchair hitting her head. EXAM: CT HEAD WITHOUT CONTRAST CT CERVICAL SPINE WITHOUT CONTRAST TECHNIQUE: Multidetector CT imaging of the head and cervical spine was performed following the standard protocol without intravenous contrast. Multiplanar CT image reconstructions of the cervical spine were also generated. COMPARISON:  Head CT 01/22/2017 FINDINGS: CT HEAD FINDINGS Limited due to streak artifacts from the patient's shoulders and patient body habitus. Brain: No acute intracranial hemorrhage, edema, midline shift nor extra-axial fluid collections noted. No hydrocephalus. No large vascular territory infarct. Vascular: No hyperdense vessel sign. Skull: No skull fracture. Sinuses/Orbits: No acute finding. Other: None CT CERVICAL SPINE FINDINGS Limited by body habitus, motion and shoulder artifacts. Alignment: Mild straightening of cervical lordosis.  No listhesis. Skull base and vertebrae: No acute fracture identified. Soft tissues and spinal canal: Mild prominence of prevertebral soft tissues likely related to patient body habitus. No visible canal hematoma. Disc levels: Multilevel degenerative disc disease with uncovertebral joint osteoarthritis C3 through T1. Multilevel degenerative facet arthropathy. Upper chest: No dominant mass Other: None IMPRESSION: Limited study due to patient body habitus and motion artifacts. No acute intracranial nor cervical spine abnormality. Cervical spondylosis. Electronically Signed   By: Ashley Royalty M.D.   On: 07/10/2017 20:14   Ct Cervical Spine Wo Contrast  Result Date: 07/10/2017 CLINICAL DATA:  Patient fell out of wheelchair hitting her head. EXAM: CT HEAD WITHOUT CONTRAST CT CERVICAL SPINE WITHOUT CONTRAST TECHNIQUE: Multidetector CT imaging of the head and cervical spine was performed following the standard protocol without intravenous contrast. Multiplanar CT image  reconstructions of the cervical spine were also generated. COMPARISON:  Head CT 01/22/2017 FINDINGS: CT HEAD FINDINGS Limited due to streak artifacts from the patient's shoulders and patient body habitus. Brain: No acute intracranial hemorrhage, edema, midline shift nor extra-axial fluid collections noted. No hydrocephalus. No large vascular territory infarct. Vascular: No hyperdense vessel sign. Skull: No skull fracture. Sinuses/Orbits: No acute finding. Other: None CT CERVICAL SPINE FINDINGS Limited by body habitus, motion and shoulder artifacts. Alignment: Mild straightening of cervical lordosis.  No listhesis. Skull base and vertebrae: No acute fracture identified. Soft tissues and spinal canal: Mild prominence of prevertebral soft tissues likely related to patient body habitus. No visible canal hematoma. Disc levels: Multilevel degenerative disc disease with uncovertebral joint osteoarthritis C3 through T1. Multilevel degenerative facet arthropathy. Upper chest: No dominant mass Other: None IMPRESSION: Limited study due to patient body habitus and  motion artifacts. No acute intracranial nor cervical spine abnormality. Cervical spondylosis. Electronically Signed   By: Ashley Royalty M.D.   On: 07/10/2017 20:14        Scheduled Meds: . allopurinol  100 mg Oral q morning - 10a  . aspirin  325 mg Oral q morning - 10a  . atorvastatin  20 mg Oral q morning - 10a  . darifenacin  7.5 mg Oral Daily  . enoxaparin (LOVENOX) injection  40 mg Subcutaneous Q24H  . fluticasone furoate-vilanterol  1 puff Inhalation Daily  . folic acid  1 mg Oral Daily  . furosemide  20 mg Oral BID  . gabapentin  100 mg Oral BID  . pantoprazole  40 mg Oral q morning - 10a  . sodium chloride flush  3 mL Intravenous Q12H  . [START ON 07/16/2017] Vitamin D (Ergocalciferol)  50,000 Units Oral Weekly   Continuous Infusions: . sodium chloride       LOS: 1 day    Time spent:35 mins. More than 50% of that time was spent in  counseling and/or coordination of care.      Shelly Coss, MD [facility] Pager [phone]  If 7PM-7AM, please contact night-coverage [email or web address] Password [email or web address] 07/11/2017, 2:19 PM

## 2017-07-11 NOTE — ED Notes (Signed)
RN advised of low O2 stats. [employer]

## 2017-07-11 NOTE — ED Notes (Signed)
Pt is not tolerating BiPap- mask frequently moving from her face. RT aware, and RT has repeatedly try to reposition, as has this RN. RT spoke with MD, to put pt back on home 3L

## 2017-07-11 NOTE — Plan of Care (Signed)
  Problem: Pain Managment: Goal: General experience of comfort will improve Outcome: Progressing   

## 2017-07-11 NOTE — ED Notes (Signed)
When this RN walked into pt room, pt was eating large bag of [employer] potato chips. Pt educated on the importance of a heart healthy diet, and provided heart healthy meal tray. Pt refuses, stating that she doesn't eat that- she is allergic to tomatoes (not listed in chart). Pt encouraged to eat grapes and salad.

## 2017-07-11 NOTE — ED Notes (Signed)
Doroteo Bradford, CNA, went into pt room. Pt complaining about meal tray, stating that she can only eat raw tomatoes (in the salad), but can't eat sauce (??). Pt demanding a la carte meal. Pt informed that the ER does not have a menu like this. Pt's friend at bedside provided cheeseburger and potato chips to pt. Pt again educated on better choices.

## 2017-07-11 NOTE — ED Notes (Signed)
Pt linen's changed, new purewick put in place, pt's CPAP mask readjusted. No complaints voiced by pt at this time.

## 2017-07-11 NOTE — Progress Notes (Signed)
Patient more lethargic over time. Sternal rub used to alert patient but immediatly falls back into sleep. Currently on 3L O2 sats can be anywhere from 82-93%. Respiratory notified to assess patient status as well as MD. Patient is very noncompliant with diet, medications, BIPAP or CPAP machines. Will continue to monitor patient closely

## 2017-07-12 LAB — BLOOD GAS, ARTERIAL
ACID-BASE EXCESS: 8.5 mmol/L — AB (ref 0.0–2.0)
BICARBONATE: 36 mmol/L — AB (ref 20.0–28.0)
DRAWN BY: 295031
O2 CONTENT: 3 L/min
O2 SAT: 96.1 %
PO2 ART: 83 mmHg (ref 83.0–108.0)
Patient temperature: 98.6
pCO2 arterial: 66.9 mmHg (ref 32.0–48.0)
pH, Arterial: 7.351 (ref 7.350–7.450)

## 2017-07-12 LAB — BASIC METABOLIC PANEL
Anion gap: 13 (ref 5–15)
BUN: 39 mg/dL — AB (ref 6–20)
CO2: 35 mmol/L — ABNORMAL HIGH (ref 22–32)
Calcium: 8.4 mg/dL — ABNORMAL LOW (ref 8.9–10.3)
Chloride: 95 mmol/L — ABNORMAL LOW (ref 101–111)
Creatinine, Ser: 1.79 mg/dL — ABNORMAL HIGH (ref 0.44–1.00)
GFR calc Af Amer: 36 mL/min — ABNORMAL LOW (ref >60)
GFR calc non Af Amer: 31 mL/min — ABNORMAL LOW (ref >60)
Glucose, Bld: 113 mg/dL — ABNORMAL HIGH (ref 65–99)
POTASSIUM: 4.8 mmol/L (ref 3.5–5.1)
Sodium: 143 mmol/L (ref 135–145)

## 2017-07-12 MED ORDER — IPRATROPIUM-ALBUTEROL 0.5-2.5 (3) MG/3ML IN SOLN
3.0000 mL | Freq: Four times a day (QID) | RESPIRATORY_TRACT | Status: DC
  Administered 2017-07-12 (×3): 3 mL via RESPIRATORY_TRACT
  Filled 2017-07-12 (×2): qty 3

## 2017-07-12 MED ORDER — ENOXAPARIN SODIUM 100 MG/ML ~~LOC~~ SOLN
90.0000 mg | SUBCUTANEOUS | Status: DC
  Filled 2017-07-12 (×3): qty 1

## 2017-07-12 MED ORDER — ALPRAZOLAM 0.5 MG PO TABS
0.5000 mg | ORAL_TABLET | Freq: Three times a day (TID) | ORAL | Status: DC | PRN
  Administered 2017-07-12 – 2017-07-16 (×9): 0.5 mg via ORAL
  Filled 2017-07-12 (×10): qty 1

## 2017-07-12 MED ORDER — HYDROXYZINE HCL 25 MG PO TABS
25.0000 mg | ORAL_TABLET | Freq: Three times a day (TID) | ORAL | Status: DC | PRN
  Administered 2017-07-12 – 2017-07-16 (×11): 25 mg via ORAL
  Filled 2017-07-12 (×11): qty 1

## 2017-07-12 NOTE — Progress Notes (Signed)
PT Cancellation Note  Patient Details Name: Kara Vincent MRN: [id number] DOB: [birth year]   Cancelled Treatment:    Reason Eval/Treat Not Completed: Patient declined, no reason specified(attempted x2, pt refused x2)   [facility] 07/12/2017, 1:42 PM

## 2017-07-12 NOTE — Progress Notes (Signed)
PROGRESS NOTE    Kara Vincent  VEL:[id number] DOB: 04/28/1965 DOA: 07/10/2017 PCP: Patient, No Pcp Per   Brief Narrative: Patient is a 52 year old female with past medical history of morbid obesity, asthma, obstructive sleep apnea, obesity hypoventilation syndrome, diastolic CHF, mild aortic stenosis, CKD stage III who presented with the emergency department after  she fell out of wheelchair.  She was apparently waiting at the bus stop with her oxygen on her wheelchair when she fell dyspneic and fell. Patient was found to have hypercarbia as per the ABG on presentation.  Patient was admitted for the management of acute on chronic respiratory failure with hypoxia and hypercarbia. She was just discharged from here few days ago after treatment for CHF exacerbation.  Patient has history of noncompliance.  Assessment & Plan:   Principal Problem:   Hyperkalemia Active Problems:   Acute respiratory failure (HCC)   CKD (chronic kidney disease) stage 3, GFR 30-59 ml/min (HCC)   OSA (obstructive sleep apnea)   Essential hypertension   GERD (gastroesophageal reflux disease)   Gout   Acute on chronic respiratory failure with hypoxia and hypercarbia: Found to have hypercarbia as per the  ABG on presentation.  Started on BiPAP. ABG repeated today with improvement in pCO2 .It remains high and she is a chronic retainer. We will continue BiPAP at least at night. Respiratory status  has improved.  History of obstructive sleep apnea/obesity hypoventilation syndrome: Seems to be on CPAP at home but not anymore for unclear reasons.  She states [facility] supposed to give her a CPAP machine. She is on  chronic oxygen therapy at home.  She needs to follow-up with pulmonology as outpatient for possible new sleep study and needs to get CPAP at home.  I have put a consult for  case manager to arrange CPAP on discharge.  Syncope: Not sure whether she syncopized.  Troponins have been cycled.   Denies any chest pain except for the discomfort on her left side where she bruised herself during the the fall. We will request for physical therapy assessment but unsure about the benefit.  She uses wheelchair for ambulation but can also walk sometimes.  Carotid Dopplers ordered ,prelim is negative. Echocardiogram shows normal left ventricular size and function, possible diastolic dysfunction. Imagings did not show any fracture or dislocation.  CT head without any acute abnormalities.  History of CHF: history of diastolic CHF. was given IV Lasix on presentation.  She is on 20 mg twice daily at home and has significant bilateral lower extremity edema but this could be chronic due to her body habitus.  We will make it 40 mg twice a day .Patient will be educated for fluid restriction to less than 1.5 L a day and salt intake to less than 2 g a day. Patient is very noncompliant on medications and diet.  Hyperkalemia: Given calcium gluconate, sodium bicarb and Kayexalate on presentation.  Hyperkalemia resolved.  Gout: Continue allopurinol.  GERD: Continue Protonix  History of asthma: Mild wheezing.  Continue her inhalers.  Continue other bronchodilators as needed  CKD stage 3: Kidney function close to her baseline.  Her baseline creatinine is around 1.5-1.8.  Continue diuresis.    DVT prophylaxis: Lovenox Code Status: Full Family Communication: None present on the bedside Disposition Plan: Likely home in 1-2 days   Consultants: None  Procedures:None  Antimicrobials:None  Subjective: Patient seen and examined the bedside this morning.  She is off BiPAP.  Currently sees comfortable and  denies any shortness of breath.  She was asking if I can change her heart healthy diet to regular.  Objective: Vitals:   07/12/17 0415 07/12/17 0800 07/12/17 1013 07/12/17 1200  BP:   111/60   Pulse:   72   Resp:   16   Temp: 98.2 F (36.8 C) 98 F (36.7 C)  98.3 F (36.8 C)  TempSrc: Axillary  Axillary  Oral  SpO2:   95%   Weight: (!) 182.8 kg (403 lb)     Height:        Intake/Output Summary (Last 24 hours) at 07/12/2017 1304 Last data filed at 07/12/2017 0300 Gross per 24 hour  Intake 0 ml  Output 900 ml  Net -900 ml   Filed Weights   07/11/17 1645 07/12/17 0415  Weight: (!) 181.9 kg (401 lb) (!) 182.8 kg (403 lb)    Examination:  General exam: Appears calm and comfortable ,Not in distress, morbidly obese HEENT:PERRL,Oral mucosa moist, Ear/Nose normal on gross exam Respiratory system: Bilateral decreased air entry on the bases, mild wheezes Cardiovascular system: S1 & S2 heard, RRR. No JVD, murmurs, rubs, gallops or clicks. Gastrointestinal system: Abdomen is nondistended, soft and nontender. No organomegaly or masses felt. Normal bowel sounds heard. Central nervous system: Alert and oriented. No focal neurological deficits. Extremities: 1-2 + edema, no clubbing ,no cyanosis, distal peripheral pulses palpable. Skin: No rashes, lesions or ulcers,no icterus ,no pallor MSK: Normal muscle bulk,tone ,power Psychiatry: Judgement and insight appear normal. Mood & affect appropriate.      Data Reviewed: I have personally reviewed following labs and imaging studies  CBC: Recent Labs  Lab 07/07/17 0630 07/08/17 0412 07/10/17 2046 07/10/17 2056 07/11/17 0535  WBC 8.1 8.9 9.0  --  8.0  NEUTROABS 5.1  --  6.7  --   --   HGB 12.1 12.3 11.7* 12.6 11.9*  HCT 40.2 41.3 38.1 37.0 40.0  MCV 100.0 100.0 99.7  --  100.5*  PLT 192 210 194  --  096   Basic Metabolic Panel: Recent Labs  Lab 07/09/17 0356 07/10/17 0436 07/10/17 2046 07/10/17 2056 07/11/17 0535 07/12/17 0339  NA 140 139 140 137 142 143  K 5.2* 5.5* 6.4* 6.1* 4.6 4.8  CL 96* 97* 100* 98* 97* 95*  CO2 33* 32 32  --  33* 35*  GLUCOSE 111* 114* 109* 107* 94 113*  BUN 43* 44* 44* 41* 39* 39*  CREATININE 2.27* 2.07* 2.19* 2.10* 1.93* 1.79*  CALCIUM 8.5* 8.6* 8.5*  --  8.6* 8.4*  MG 2.1 2.1  --   --   --    --    GFR: Estimated Creatinine Clearance: 61.6 mL/min (A) (by C-G formula based on SCr of 1.79 mg/dL (H)). Liver Function Tests: Recent Labs  Lab 07/07/17 0630 07/08/17 0412 07/10/17 2046 07/11/17 0535  AST 11* 11* 21 21  ALT 10* 11* 14 14  ALKPHOS 53 55 63 57  BILITOT 0.4 0.7 0.6 0.5  PROT 6.8 6.8 7.4 6.7  ALBUMIN 3.5 3.5 3.8 3.4*   No results for input(s): LIPASE, AMYLASE in the last 168 hours. No results for input(s): AMMONIA in the last 168 hours. Coagulation Profile: No results for input(s): INR, PROTIME in the last 168 hours. Cardiac Enzymes: Recent Labs  Lab 07/11/17 0535 07/11/17 1035  TROPONINI <0.03 <0.03   BNP (last 3 results) No results for input(s): PROBNP in the last 8760 hours. HbA1C: No results for input(s): HGBA1C in the last 72 hours.  CBG: No results for input(s): GLUCAP in the last 168 hours. Lipid Profile: No results for input(s): CHOL, HDL, LDLCALC, TRIG, CHOLHDL, LDLDIRECT in the last 72 hours. Thyroid Function Tests: No results for input(s): TSH, T4TOTAL, FREET4, T3FREE, THYROIDAB in the last 72 hours. Anemia Panel: No results for input(s): VITAMINB12, FOLATE, FERRITIN, TIBC, IRON, RETICCTPCT in the last 72 hours. Sepsis Labs: No results for input(s): PROCALCITON, LATICACIDVEN in the last 168 hours.  Recent Results (from the past 240 hour(s))  MRSA PCR Screening     Status: Abnormal   Collection Time: 07/07/17  5:36 PM  Result Value Ref Range Status   MRSA by PCR POSITIVE (A) NEGATIVE Final    Comment:        The GeneXpert MRSA Assay (FDA approved for NASAL specimens only), is one component of a comprehensive MRSA colonization surveillance program. It is not intended to diagnose MRSA infection nor to guide or monitor treatment for MRSA infections. RESULT CALLED TO, READ BACK BY AND VERIFIED WITH: SEAGRAVES,A. RN @1942  ON 04.16.19 BY COHEN,K Performed at [facility], [place] [address] [place], [place] [address]            Radiology Studies: Dg Chest 2 View  Result Date: 07/10/2017 CLINICAL DATA:  Short of breath after a fall. EXAM: CHEST - 2 VIEW COMPARISON:  07/06/2017 FINDINGS: Both views are degraded by patient body habitus. Frontal view is oblique, apical lordotic positioning. Cardiomegaly accentuated by AP portable technique. No definite pleural fluid. No pneumothorax. Low lung volumes with resultant pulmonary interstitial prominence. No well-defined lobar consolidation. Lung bases not well evaluated, especially on the frontal radiograph. IMPRESSION: Moderately degraded exam, as detailed above. Cardiomegaly and low lung volumes.  No definite acute disease. Electronically Signed   By: Abigail Miyamoto M.D.   On: 07/10/2017 20:03   Ct Head Wo Contrast  Result Date: 07/10/2017 CLINICAL DATA:  Patient fell out of wheelchair hitting her head. EXAM: CT HEAD WITHOUT CONTRAST CT CERVICAL SPINE WITHOUT CONTRAST TECHNIQUE: Multidetector CT imaging of the head and cervical spine was performed following the standard protocol without intravenous contrast. Multiplanar CT image reconstructions of the cervical spine were also generated. COMPARISON:  Head CT 01/22/2017 FINDINGS: CT HEAD FINDINGS Limited due to streak artifacts from the patient's shoulders and patient body habitus. Brain: No acute intracranial hemorrhage, edema, midline shift nor extra-axial fluid collections noted. No hydrocephalus. No large vascular territory infarct. Vascular: No hyperdense vessel sign. Skull: No skull fracture. Sinuses/Orbits: No acute finding. Other: None CT CERVICAL SPINE FINDINGS Limited by body habitus, motion and shoulder artifacts. Alignment: Mild straightening of cervical lordosis.  No listhesis. Skull base and vertebrae: No acute fracture identified. Soft tissues and spinal canal: Mild prominence of prevertebral soft tissues likely related to patient body habitus. No visible canal hematoma. Disc levels: Multilevel degenerative disc  disease with uncovertebral joint osteoarthritis C3 through T1. Multilevel degenerative facet arthropathy. Upper chest: No dominant mass Other: None IMPRESSION: Limited study due to patient body habitus and motion artifacts. No acute intracranial nor cervical spine abnormality. Cervical spondylosis. Electronically Signed   By: Ashley Royalty M.D.   On: 07/10/2017 20:14   Ct Cervical Spine Wo Contrast  Result Date: 07/10/2017 CLINICAL DATA:  Patient fell out of wheelchair hitting her head. EXAM: CT HEAD WITHOUT CONTRAST CT CERVICAL SPINE WITHOUT CONTRAST TECHNIQUE: Multidetector CT imaging of the head and cervical spine was performed following the standard protocol without intravenous contrast. Multiplanar CT image reconstructions of the cervical spine were also  generated. COMPARISON:  Head CT 01/22/2017 FINDINGS: CT HEAD FINDINGS Limited due to streak artifacts from the patient's shoulders and patient body habitus. Brain: No acute intracranial hemorrhage, edema, midline shift nor extra-axial fluid collections noted. No hydrocephalus. No large vascular territory infarct. Vascular: No hyperdense vessel sign. Skull: No skull fracture. Sinuses/Orbits: No acute finding. Other: None CT CERVICAL SPINE FINDINGS Limited by body habitus, motion and shoulder artifacts. Alignment: Mild straightening of cervical lordosis.  No listhesis. Skull base and vertebrae: No acute fracture identified. Soft tissues and spinal canal: Mild prominence of prevertebral soft tissues likely related to patient body habitus. No visible canal hematoma. Disc levels: Multilevel degenerative disc disease with uncovertebral joint osteoarthritis C3 through T1. Multilevel degenerative facet arthropathy. Upper chest: No dominant mass Other: None IMPRESSION: Limited study due to patient body habitus and motion artifacts. No acute intracranial nor cervical spine abnormality. Cervical spondylosis. Electronically Signed   By: Ashley Royalty M.D.   On: 07/10/2017  20:14        Scheduled Meds: . allopurinol  100 mg Oral q morning - 10a  . aspirin  325 mg Oral q morning - 10a  . atorvastatin  20 mg Oral q morning - 10a  . darifenacin  7.5 mg Oral Daily  . [START ON 07/13/2017] enoxaparin (LOVENOX) injection  90 mg Subcutaneous Q24H  . fluticasone furoate-vilanterol  1 puff Inhalation Daily  . folic acid  1 mg Oral Daily  . furosemide  40 mg Oral BID  . gabapentin  100 mg Oral BID  . ipratropium-albuterol  3 mL Nebulization Q6H  . pantoprazole  40 mg Oral q morning - 10a  . sodium chloride flush  3 mL Intravenous Q12H  . [START ON 07/16/2017] Vitamin D (Ergocalciferol)  50,000 Units Oral Weekly   Continuous Infusions: . sodium chloride       LOS: 2 days    Time spent:35 mins. More than 50% of that time was spent in counseling and/or coordination of care.      Shelly Coss, MD [facility] Pager [phone]  If 7PM-7AM, please contact night-coverage [email or web address] Password [email or web address] 07/12/2017, 1:04 PM

## 2017-07-13 DIAGNOSIS — I5033 Acute on chronic diastolic (congestive) heart failure: Secondary | ICD-10-CM

## 2017-07-13 LAB — BLOOD GAS, ARTERIAL
ACID-BASE EXCESS: 7.7 mmol/L — AB (ref 0.0–2.0)
Bicarbonate: 36.3 mmol/L — ABNORMAL HIGH (ref 20.0–28.0)
DRAWN BY: 257881
O2 CONTENT: 3 L/min
O2 SAT: 91.3 %
PCO2 ART: 77.6 mmHg — AB (ref 32.0–48.0)
Patient temperature: 98.6
pH, Arterial: 7.291 — ABNORMAL LOW (ref 7.350–7.450)
pO2, Arterial: 66.9 mmHg — ABNORMAL LOW (ref 83.0–108.0)

## 2017-07-13 LAB — BASIC METABOLIC PANEL
Anion gap: 13 (ref 5–15)
BUN: 37 mg/dL — ABNORMAL HIGH (ref 6–20)
CO2: 37 mmol/L — ABNORMAL HIGH (ref 22–32)
CREATININE: 1.68 mg/dL — AB (ref 0.44–1.00)
Calcium: 8.2 mg/dL — ABNORMAL LOW (ref 8.9–10.3)
Chloride: 94 mmol/L — ABNORMAL LOW (ref 101–111)
GFR calc non Af Amer: 34 mL/min — ABNORMAL LOW (ref >60)
GFR, EST AFRICAN AMERICAN: 39 mL/min — AB (ref >60)
Glucose, Bld: 93 mg/dL (ref 65–99)
Potassium: 4.5 mmol/L (ref 3.5–5.1)
SODIUM: 144 mmol/L (ref 135–145)

## 2017-07-13 MED ORDER — DOCUSATE SODIUM 100 MG PO CAPS
100.0000 mg | ORAL_CAPSULE | Freq: Two times a day (BID) | ORAL | Status: DC
  Administered 2017-07-13 – 2017-07-16 (×6): 100 mg via ORAL
  Filled 2017-07-13 (×6): qty 1

## 2017-07-13 MED ORDER — OMEGA-3-ACID ETHYL ESTERS 1 G PO CAPS
1.0000 g | ORAL_CAPSULE | Freq: Every day | ORAL | Status: DC
  Administered 2017-07-13 – 2017-07-14 (×2): 1 g via ORAL
  Filled 2017-07-13 (×2): qty 1

## 2017-07-13 MED ORDER — KETOCONAZOLE 2 % EX SHAM
1.0000 "application " | MEDICATED_SHAMPOO | CUTANEOUS | Status: DC
  Administered 2017-07-13: 1 via TOPICAL
  Filled 2017-07-13: qty 120

## 2017-07-13 MED ORDER — FUROSEMIDE 40 MG PO TABS
60.0000 mg | ORAL_TABLET | Freq: Two times a day (BID) | ORAL | Status: DC
  Administered 2017-07-13 – 2017-07-14 (×2): 60 mg via ORAL
  Filled 2017-07-13 (×2): qty 1

## 2017-07-13 MED ORDER — NYSTATIN 100000 UNIT/GM EX CREA
TOPICAL_CREAM | Freq: Three times a day (TID) | CUTANEOUS | Status: DC
  Administered 2017-07-13 – 2017-07-16 (×9): via TOPICAL
  Filled 2017-07-13: qty 15

## 2017-07-13 MED ORDER — OXYCODONE-ACETAMINOPHEN 5-325 MG PO TABS
1.0000 | ORAL_TABLET | Freq: Four times a day (QID) | ORAL | Status: DC | PRN
Start: 2017-07-13 — End: 2017-07-16
  Administered 2017-07-13 – 2017-07-16 (×11): 1 via ORAL
  Filled 2017-07-13 (×12): qty 1

## 2017-07-13 MED ORDER — CETAPHIL MOISTURIZING EX LOTN
TOPICAL_LOTION | Freq: Two times a day (BID) | CUTANEOUS | Status: DC | PRN
  Administered 2017-07-13: 17:00:00 via TOPICAL
  Filled 2017-07-13: qty 473

## 2017-07-13 MED ORDER — IPRATROPIUM-ALBUTEROL 0.5-2.5 (3) MG/3ML IN SOLN
3.0000 mL | Freq: Three times a day (TID) | RESPIRATORY_TRACT | Status: DC
  Administered 2017-07-13 – 2017-07-15 (×6): 3 mL via RESPIRATORY_TRACT
  Filled 2017-07-13 (×8): qty 3

## 2017-07-13 NOTE — Evaluation (Signed)
Occupational Therapy Evaluation Patient Details Name: Kara Vincent MRN: [id number] DOB: 12-07-1965 Today's Date: 07/13/2017    History of Present Illness 52 year old female with past medical history of morbid obesity, asthma, obstructive sleep apnea, obesity, hypoventilation syndrome, diastolic CHF, mild aortic stenosis, CKD stage III who presented with the emergency department after  she fell out of wheelchair. Per pt report she was waiting at the bus stop with her oxygen on her wheelchair when she felt dyspneic and fell. Pt was admitted for the management of acute on chronic respiratory failure with hypoxia and hypercarbia. Of note pt was recently admitted and discharged on 4/19 after treatment for CHF exacerbation   Clinical Impression   This 52 y/o female presents with the above. Pt has power w/c for functional mobility, reports intermittently using cane for ambulation; reports she receives intermittent assist for ADL completion from a friend. Pt presenting this session with decreased activity tolerance, pain, and generalized weakness. Pt completing bed mobility and sit<>stand from EOB using RW with MinA. Unable to attempt further mobility due to pain in RLE and fatigue. Pt currently requires mod-maxA for LB ADLs, minA for UB ADLs. Pt will benefit from continued skilled OT services to maximize her overall safety and independence with ADLs and mobility.     Follow Up Recommendations  Supervision/Assistance - 24 hour;Other (comment)(pending pt progress, may need SNF )    Equipment Recommendations  3 in 1 bedside commode;Other (comment)(bariatric BSC)           Precautions / Restrictions Precautions Precautions: Fall Precaution Comments: oxygen dependent Restrictions Weight Bearing Restrictions: No      Mobility Bed Mobility Overal bed mobility: Needs Assistance Bed Mobility: Supine to Sit     Supine to sit: Min guard     General bed mobility comments: MinGuard for safety  with use of bedrails; increased time/effort   Transfers Overall transfer level: Needs assistance Equipment used: Rolling walker (2 wheeled) Transfers: Sit to/from Stand Sit to Stand: Min assist         General transfer comment: MinA to rise and steady at [employer] Overall balance assessment: Needs assistance   Sitting balance-Leahy Scale: Good     Standing balance support: Bilateral upper extremity supported Standing balance-Leahy Scale: Poor Standing balance comment: reliant on UE support                            ADL either performed or assessed with clinical judgement   ADL Overall ADL's : Needs assistance/impaired Eating/Feeding: Modified independent;Sitting Eating/Feeding Details (indicate cue type and reason): pt sitting EOB eating lunch at end of session  Grooming: Set up;Sitting   Upper Body Bathing: Minimal assistance;Sitting   Lower Body Bathing: Moderate assistance;Sit to/from stand   Upper Body Dressing : Minimal assistance;Sitting   Lower Body Dressing: Sit to/from stand;Maximal assistance                 General ADL Comments: Pt completing bed mobility and sit<>stand from EOB with MinA at RW, increased pain in RLE therefore returned to sitting, requesting to each lunch sitting EOB and RN made aware                   Pertinent Vitals/Pain Pain Assessment: Faces Faces Pain Scale: Hurts even more Pain Location: RLE  Pain Descriptors / Indicators: Guarding;Grimacing Pain Intervention(s): Monitored during session;Repositioned          Extremity/Trunk Assessment  Upper Extremity Assessment Upper Extremity Assessment: Generalized weakness;RUE deficits/detail;LUE deficits/detail RUE Deficits / Details: pt reports limited AROM due to shoulder pain  LUE Deficits / Details: pt reports limited AROM due to shoulder pain    Lower Extremity Assessment Lower Extremity Assessment: Defer to PT evaluation       Communication  Communication Communication: No difficulties   Cognition Arousal/Alertness: Awake/alert Behavior During Therapy: WFL for tasks assessed/performed Overall Cognitive Status: Within Functional Limits for tasks assessed                                                      Home Living Family/patient expects to be discharged to:: Other (Comment)(reports recently staying at a hotel ) Living Arrangements: Alone Available Help at Discharge: Available PRN/intermittently;Friend(s) Type of Home: Other(Comment)(hotel) Home Access: Level entry     Home Layout: One level     Bathroom Shower/Tub: Teacher, early years/pre: Standard     Home Equipment: Financial trader - single point          Prior Functioning/Environment Level of Independence: Needs assistance  Gait / Transfers Assistance Needed: per most recent chart review/history, pt uses scooter or cane in motel room, but over past 3 weeks has had "caregiver" (friend) help her more with walks to bathroom due to LE pain/weakness and couple of falls ADL's / Homemaking Assistance Needed: friend helps with bathing; occasionally helps with LB dressing   Comments: pt reports w/c will not fit into hotel bathroom         OT Problem List: Decreased strength;Impaired balance (sitting and/or standing);Pain;Decreased range of motion;Decreased activity tolerance      OT Treatment/Interventions: Self-care/ADL training;DME and/or AE instruction;Therapeutic activities;Balance training;Therapeutic exercise;Patient/family education    OT Goals(Current goals can be found in the care plan section) Acute Rehab OT Goals Patient Stated Goal: To get better OT Goal Formulation: With patient Time For Goal Achievement: 07/27/17 Potential to Achieve Goals: Good  OT Frequency: Min 2X/week                             AM-PAC PT "6 Clicks" Daily Activity     Outcome Measure Help from another person eating  meals?: None Help from another person taking care of personal grooming?: A Little Help from another person toileting, which includes using toliet, bedpan, or urinal?: A Lot Help from another person bathing (including washing, rinsing, drying)?: A Lot Help from another person to put on and taking off regular upper body clothing?: A Little Help from another person to put on and taking off regular lower body clothing?: A Lot 6 Click Score: 16   End of Session Equipment Utilized During Treatment: Oxygen Nurse Communication: Mobility status;Other (comment)(pt sitting EOB )  Activity Tolerance: Patient limited by fatigue Patient left: with call bell/phone within reach;Other (comment)(sitting EOB )  OT Visit Diagnosis: Muscle weakness (generalized) (M62.81)                Time: 7262-0355 OT Time Calculation (min): 16 min Charges:  OT General Charges $OT Visit: 1 Visit OT Evaluation $OT Eval Moderate Complexity: 1 Mod G-Codes:     Lou Cal, OT Pager [phone] 07/13/2017   Raymondo Band 07/13/2017, 3:32 PM

## 2017-07-13 NOTE — Progress Notes (Signed)
RT called ABG results to the nurse [place] Ar

## 2017-07-13 NOTE — Progress Notes (Signed)
Rt placed Pt on her BIPAP because she was sleeping

## 2017-07-13 NOTE — Progress Notes (Signed)
PROGRESS NOTE    MARSHELLA TELLO  PYP:[id number] DOB: 01-15-1966 DOA: 07/10/2017 PCP: Patient, No Pcp Per   Brief Narrative: Patient is a 52 year old female with past medical history of morbid obesity, asthma, obstructive sleep apnea, obesity hypoventilation syndrome, diastolic CHF, mild aortic stenosis, CKD stage III who presented with the emergency department after  she fell out of wheelchair.  She was apparently waiting at the bus stop with her oxygen on her wheelchair when she fell dyspneic and fell. Patient was found to have hypercarbia as per the ABG on presentation.  Patient was admitted for the management of acute on chronic respiratory failure with hypoxia and hypercarbia. She was just discharged 4/19  from here few days ago after treatment for CHF exacerbation.  Patient has history of noncompliance.  Assessment & Plan:   Principal Problem:   Hyperkalemia Active Problems:   Acute respiratory failure (HCC)   CKD (chronic kidney disease) stage 3, GFR 30-59 ml/min (HCC)   OSA (obstructive sleep apnea)   Essential hypertension   GERD (gastroesophageal reflux disease)   Gout   Acute on chronic hypoxic hypercarbic respiratory failure secondary to acute on chronic diastolic congestive heart failure /obesity hypoventilation syndrome/OSA: Found to have hypercarbia as per the  ABG on presentation.  Started on BiPAP.now off BiPAP and on 3 L of oxygen She is a chronic CO2 retainer , she will benefit from daily at bedtime BiPAP, she will need a formal sleep study We will continue BiPAP at least at night. EF preserved on recent echo on 4/18 Suspect poor dietary compliance and medication noncompliance Home a fixed dose has been increased to 40 mg by mouth 2 times a day Respiratory status  has improved. Repeat ABG 4/22, looks slightly worse, advised patient to minimize narcotics and benzodiazepines  History of obstructive sleep apnea/obesity hypoventilation syndrome: Seems to be on CPAP at  home but not anymore for unclear reasons.  She states [facility] her [facility]. She is on  chronic oxygen therapy at home.  She needs to follow-up with pulmonology as outpatient for possible new sleep study and needs to get CPAP at home.  I have put a consult for  case manager to arrange CPAP on discharge.will also put in for an outpatient sleep study referral  Syncope: Not sure whether she syncopized.  Troponins negative 3.  Denies any chest pain except for the discomfort on her left side where she bruised herself during the the fall. Patient discharged on 4/19 with home health. Repeat therapy evaluations pending. She uses wheelchair for ambulation but can also walk sometimes.  Carotid Dopplers negative. Echocardiogram shows normal left ventricular size and function, possible diastolic dysfunction. Imagings did not show any fracture or dislocation.  CT head without any acute abnormalities.  Acute on chronic diastolic heart failure: history of diastolic CHF. was given IV Lasix on presentation.  She is on 20 mg twice daily at home and has significant bilateral lower extremity edema but this could be chronic due to her body habitus.   Increased Lasix to 60 mg twice a day .Patient will be educated for fluid restriction to less than 1.5 L a day and salt intake to less than 2 g a day. Patient is very noncompliant on medications and diet.  Hyperkalemia: Given calcium gluconate, sodium bicarb and Kayexalate on presentation.  Hyperkalemia resolved.  Gout: Continue allopurinol.  GERD: Continue Protonix  History of asthma: Mild wheezing.  Continue her inhalers.  Continue  other bronchodilators as needed  CKD stage 3: she presented with a creatinine of 2.27, now 1.68 , improved with diuresisKidney function close to her baseline.  Her baseline creatinine is around 1.5-1.8.  Continue diuresis.    DVT prophylaxis: Lovenox Code Status: Full Family Communication: None  present on the bedside Disposition Plan: PT OT evaluation pending, continue diuresis, repeat ABG worse , repeat in am , anticipate discharge 4/23    Consultants: None  Procedures:None  Antimicrobials:None  Subjective: Patient seen and examined the bedside this morning.  She is off BiPAP.      Objective: Vitals:   07/12/17 2126 07/13/17 0500 07/13/17 0502 07/13/17 0857  BP:   98/63   Pulse:  70 67   Resp:  20    Temp:  97.6 F (36.4 C)    TempSrc:      SpO2: 94% 93%  95%  Weight:      Height:        Intake/Output Summary (Last 24 hours) at 07/13/2017 0929 Last data filed at 07/13/2017 0855 Gross per 24 hour  Intake 1080 ml  Output 1350 ml  Net -270 ml   Filed Weights   07/11/17 1645 07/12/17 0415  Weight: (!) 181.9 kg (401 lb) (!) 182.8 kg (403 lb)    Examination:  General exam: Appears calm and comfortable ,Not in distress, morbidly obese HEENT:PERRL,Oral mucosa moist, Ear/Nose normal on gross exam Respiratory system: Bilateral decreased air entry on the bases, bibasilar crackles Cardiovascular system: S1 & S2 heard, RRR. No JVD, murmurs, rubs, gallops or clicks. Gastrointestinal system: Abdomen is nondistended, soft and nontender. No organomegaly or masses felt. Normal bowel sounds heard. Central nervous system: Alert and oriented. No focal neurological deficits. Extremities:  2+ pitting edema, no clubbing ,no cyanosis, distal peripheral pulses palpable. Skin: No rashes, lesions or ulcers,no icterus ,no pallor MSK: Normal muscle bulk,tone ,power Psychiatry: Judgement and insight appear normal. Mood & affect appropriate.      Data Reviewed: I have personally reviewed following labs and imaging studies  CBC: Recent Labs  Lab 07/07/17 0630 07/08/17 0412 07/10/17 2046 07/10/17 2056 07/11/17 0535  WBC 8.1 8.9 9.0  --  8.0  NEUTROABS 5.1  --  6.7  --   --   HGB 12.1 12.3 11.7* 12.6 11.9*  HCT 40.2 41.3 38.1 37.0 40.0  MCV 100.0 100.0 99.7  --  100.5*    PLT 192 210 194  --  875   Basic Metabolic Panel: Recent Labs  Lab 07/09/17 0356 07/10/17 0436 07/10/17 2046 07/10/17 2056 07/11/17 0535 07/12/17 0339 07/13/17 0502  NA 140 139 140 137 142 143 144  K 5.2* 5.5* 6.4* 6.1* 4.6 4.8 4.5  CL 96* 97* 100* 98* 97* 95* 94*  CO2 33* 32 32  --  33* 35* 37*  GLUCOSE 111* 114* 109* 107* 94 113* 93  BUN 43* 44* 44* 41* 39* 39* 37*  CREATININE 2.27* 2.07* 2.19* 2.10* 1.93* 1.79* 1.68*  CALCIUM 8.5* 8.6* 8.5*  --  8.6* 8.4* 8.2*  MG 2.1 2.1  --   --   --   --   --    GFR: Estimated Creatinine Clearance: 65.6 mL/min (A) (by C-G formula based on SCr of 1.68 mg/dL (H)). Liver Function Tests: Recent Labs  Lab 07/07/17 0630 07/08/17 0412 07/10/17 2046 07/11/17 0535  AST 11* 11* 21 21  ALT 10* 11* 14 14  ALKPHOS 53 55 63 57  BILITOT 0.4 0.7 0.6 0.5  PROT 6.8 6.8  7.4 6.7  ALBUMIN 3.5 3.5 3.8 3.4*   No results for input(s): LIPASE, AMYLASE in the last 168 hours. No results for input(s): AMMONIA in the last 168 hours. Coagulation Profile: No results for input(s): INR, PROTIME in the last 168 hours. Cardiac Enzymes: Recent Labs  Lab 07/11/17 0535 07/11/17 1035  TROPONINI <0.03 <0.03   BNP (last 3 results) No results for input(s): PROBNP in the last 8760 hours. HbA1C: No results for input(s): HGBA1C in the last 72 hours. CBG: No results for input(s): GLUCAP in the last 168 hours. Lipid Profile: No results for input(s): CHOL, HDL, LDLCALC, TRIG, CHOLHDL, LDLDIRECT in the last 72 hours. Thyroid Function Tests: No results for input(s): TSH, T4TOTAL, FREET4, T3FREE, THYROIDAB in the last 72 hours. Anemia Panel: No results for input(s): VITAMINB12, FOLATE, FERRITIN, TIBC, IRON, RETICCTPCT in the last 72 hours. Sepsis Labs: No results for input(s): PROCALCITON, LATICACIDVEN in the last 168 hours.  Recent Results (from the past 240 hour(s))  MRSA PCR Screening     Status: Abnormal   Collection Time: 07/07/17  5:36 PM  Result Value  Ref Range Status   MRSA by PCR POSITIVE (A) NEGATIVE Final    Comment:        The GeneXpert MRSA Assay (FDA approved for NASAL specimens only), is one component of a comprehensive MRSA colonization surveillance program. It is not intended to diagnose MRSA infection nor to guide or monitor treatment for MRSA infections. RESULT CALLED TO, READ BACK BY AND VERIFIED WITH: SEAGRAVES,A. RN @1942  ON 04.16.19 BY COHEN,K Performed at [facility], [place] [address], [place], [place] [address]          Radiology Studies: No results found.      Scheduled Meds: . allopurinol  100 mg Oral q morning - 10a  . aspirin  325 mg Oral q morning - 10a  . atorvastatin  20 mg Oral q morning - 10a  . darifenacin  7.5 mg Oral Daily  . enoxaparin (LOVENOX) injection  90 mg Subcutaneous Q24H  . fluticasone furoate-vilanterol  1 puff Inhalation Daily  . folic acid  1 mg Oral Daily  . furosemide  40 mg Oral BID  . gabapentin  100 mg Oral BID  . ipratropium-albuterol  3 mL Nebulization TID  . pantoprazole  40 mg Oral q morning - 10a  . sodium chloride flush  3 mL Intravenous Q12H  . [START ON 07/16/2017] Vitamin D (Ergocalciferol)  50,000 Units Oral Weekly   Continuous Infusions: . sodium chloride       LOS: 3 days    Time spent:35 mins. More than 50% of that time was spent in counseling and/or coordination of care.      [name], MD [facility]    If 7PM-7AM, please contact night-coverage [email or web address] Password [facility] 07/13/2017, 9:29 AM

## 2017-07-14 DIAGNOSIS — R55 Syncope and collapse: Secondary | ICD-10-CM

## 2017-07-14 LAB — BASIC METABOLIC PANEL
Anion gap: 12 (ref 5–15)
BUN: 33 mg/dL — AB (ref 6–20)
CHLORIDE: 93 mmol/L — AB (ref 101–111)
CO2: 38 mmol/L — ABNORMAL HIGH (ref 22–32)
CREATININE: 1.89 mg/dL — AB (ref 0.44–1.00)
Calcium: 8.3 mg/dL — ABNORMAL LOW (ref 8.9–10.3)
GFR calc non Af Amer: 29 mL/min — ABNORMAL LOW (ref >60)
GFR, EST AFRICAN AMERICAN: 34 mL/min — AB (ref >60)
Glucose, Bld: 161 mg/dL — ABNORMAL HIGH (ref 65–99)
POTASSIUM: 4.1 mmol/L (ref 3.5–5.1)
SODIUM: 143 mmol/L (ref 135–145)

## 2017-07-14 LAB — CBC
HCT: 38.4 % (ref 36.0–46.0)
HEMOGLOBIN: 11.4 g/dL — AB (ref 12.0–15.0)
MCH: 29.8 pg (ref 26.0–34.0)
MCHC: 29.7 g/dL — AB (ref 30.0–36.0)
MCV: 100.3 fL — ABNORMAL HIGH (ref 78.0–100.0)
Platelets: 192 10*3/uL (ref 150–400)
RBC: 3.83 MIL/uL — AB (ref 3.87–5.11)
RDW: 13.5 % (ref 11.5–15.5)
WBC: 8.1 10*3/uL (ref 4.0–10.5)

## 2017-07-14 MED ORDER — MORPHINE SULFATE (PF) 4 MG/ML IV SOLN: 2.0000 mg | Freq: Once | INTRAVENOUS | Status: DC

## 2017-07-14 MED ORDER — AMMONIUM LACTATE 12 % EX LOTN: TOPICAL_LOTION | Freq: Every day | CUTANEOUS | Status: DC | PRN

## 2017-07-14 MED ORDER — POLYVINYL ALCOHOL 1.4 % OP SOLN
1.0000 [drp] | OPHTHALMIC | Status: DC | PRN
  Administered 2017-07-14 – 2017-07-16 (×3): 1 [drp] via OPHTHALMIC
  Filled 2017-07-14: qty 15

## 2017-07-14 MED ORDER — FUROSEMIDE 10 MG/ML IJ SOLN
80.0000 mg | Freq: Two times a day (BID) | INTRAMUSCULAR | Status: DC
  Administered 2017-07-14 – 2017-07-15 (×3): 80 mg via INTRAVENOUS
  Filled 2017-07-14 (×3): qty 8

## 2017-07-14 NOTE — Evaluation (Signed)
Physical Therapy Evaluation Patient Details Name: Kara Vincent MRN: [id number] DOB: 10-Feb-1966 Today's Date: 07/14/2017   History of Present Illness  52 year old female with past medical history of morbid obesity, asthma, obstructive sleep apnea, obesity, hypoventilation syndrome, diastolic CHF, mild aortic stenosis, CKD stage III who presented with the emergency department after  she fell out of wheelchair. Per pt report she was waiting at the bus stop with her oxygen on her wheelchair when she felt dyspneic and fell. Pt was admitted for the management of acute on chronic respiratory failure with hypoxia and hypercarbia. Of note pt was recently admitted and discharged on 4/19 after treatment for CHF exacerbation  Clinical Impression  The patient is mobilizing well from bed to recliner today. Uncertain of Dc plan. Patient apparantly lives in a motel.  Pt admitted with above diagnosis. Pt currently with functional limitations due to the deficits listed below (see PT Problem List).  Pt will benefit from skilled PT to increase their independence and safety with mobility to allow discharge to the venue listed below.       Follow Up Recommendations Home health PT;SNF    Equipment Recommendations  Rolling walker with 5" wheels;3in1 (PT);Other (comment)(bariatric)    Recommendations for Other Services       Precautions / Restrictions Precautions Precautions: Fall Precaution Comments: oxygen dependent      Mobility  Bed Mobility Overal bed mobility: Needs Assistance Bed Mobility: Supine to Sit;Sit to Supine     Supine to sit: Modified independent (Device/Increase time)     General bed mobility comments: uses rail. gets up without assist.  Transfers Overall transfer level: Needs assistance   Transfers: Sit to/from Stand;Stand Pivot Transfers Sit to Stand: Min guard Stand pivot transfers: Min guard       General transfer comment: holds onto the [facility] armrests to turn and sit,  goes back to bed with rail of bed.  Ambulation/Gait                Stairs            Wheelchair Mobility    Modified Rankin (Stroke Patients Only)       Balance                                             Pertinent Vitals/Pain Faces Pain Scale: Hurts even more Pain Location: HA Pain Descriptors / Indicators: Guarding;Grimacing Pain Intervention(s): Monitored during session    Home Living Family/patient expects to be discharged to:: Unsure   Available Help at Discharge: Available PRN/intermittently;Friend(s) Type of Home: Other(Comment)(hotel)       Home Layout: One level Home Equipment: Financial trader - single point      Prior Function Level of Independence: Needs assistance   Gait / Transfers Assistance Needed: per most recent chart review/history, pt uses scooter or cane in motel room, but over past 3 weeks has had "caregiver" (friend) help her more with walks to bathroom due to LE pain/weakness and couple of falls  ADL's / Homemaking Assistance Needed: friend helps with bathing; occasionally helps with LB dressing  Comments: pt reports w/c will not fit into hotel bathroom      Hand Dominance        Extremity/Trunk Assessment   Upper Extremity Assessment Upper Extremity Assessment: Defer to OT evaluation    Lower Extremity Assessment Lower Extremity Assessment: Overall [facility]  for tasks assessed(for transfers)       Communication   Communication: No difficulties  Cognition Arousal/Alertness: Awake/alert Behavior During Therapy: WFL for tasks assessed/performed Overall Cognitive Status: Within Functional Limits for tasks assessed                                        General Comments      Exercises     Assessment/Plan    PT Assessment Patient needs continued PT services  PT Problem List Decreased strength;Decreased mobility;Decreased safety awareness;Decreased activity  tolerance;Pain;Decreased balance;Decreased knowledge of use of DME       PT Treatment Interventions DME instruction;Therapeutic activities;Gait training;Therapeutic exercise;Other (comment);Stair training;Balance training;Wheelchair mobility training    PT Goals (Current goals can be found in the Care Plan section)  Acute Rehab PT Goals Patient Stated Goal: to get in the shower PT Goal Formulation: With patient Time For Goal Achievement: 07/28/17 Potential to Achieve Goals: Good    Frequency Min 2X/week   Barriers to discharge        Co-evaluation               AM-PAC PT "6 Clicks" Daily Activity  Outcome Measure Difficulty turning over in bed (including adjusting bedclothes, sheets and blankets)?: A Little Difficulty moving from lying on back to sitting on the side of the bed? : A Little Difficulty sitting down on and standing up from a chair with arms (e.g., wheelchair, bedside commode, etc,.)?: A Little Help needed moving to and from a bed to chair (including a wheelchair)?: A Little Help needed walking in hospital room?: A Little Help needed climbing 3-5 steps with a railing? : Total 6 Click Score: 16    End of Session Equipment Utilized During Treatment: Oxygen Activity Tolerance: Patient limited by fatigue Patient left: in bed;with nursing/sitter in room Nurse Communication: Mobility status PT Visit Diagnosis: Other abnormalities of gait and mobility (R26.89);Muscle weakness (generalized) (M62.81);Pain Pain - Right/Left: Right Pain - part of body: Knee    Time: 8757-9728 PT Time Calculation (min) (ACUTE ONLY): 33 min   Charges:   PT Evaluation $PT Eval Moderate Complexity: 1 Mod PT Treatments $Therapeutic Activity: 8-22 mins   PT G CodesTresa Endo PT [phone]   Claretha Cooper 07/14/2017, 1:28 PM

## 2017-07-14 NOTE — Progress Notes (Signed)
Pt was setup with [place] [facility] on 4/19 when she discharged from [place] will continue to ask for CPAP machine and other DME that she has already received. Pt is a difficult discharge. Again, Will not be able to supply CPAP machine to pt related to: CPAP(patient states she lost her CPAP), per [facility] she has an outstanding balance $46.25. [facility] provided her a charity CPAP in 2010. Patient never had sleep study and will need to have one. [facility] also has provided her with home 02. Will continue to follow.

## 2017-07-14 NOTE — Progress Notes (Signed)
[facility] PROGRESS NOTE    Progress Note  Kara Vincent  IOE:[id number] DOB: 05-Jun-1965 DOA: 07/10/2017 PCP: Patient, No Pcp Per     Brief Narrative:   Kara Vincent is an 52 y.o. female past medical history of morbid obesity, asthma obstructive sleep apnea and obesity hypoventilation syndrome, with chronic diastolic heart failure and mild aortic stenosis stage III chronic kidney disease who presents to the ED due to a fall out of a wheelchair.  He was found to be hypercarbic on ABG in the ED and was admitted for acute on chronic respiratory failure due to hypoxia and hypercarbia  Assessment/Plan:   Acute on chronic respiratory failure with hypoxia and hypercapnia (HCC) probably due to acute on chronic diastolic heart failure/obesity hypoventilation syndrome: To be hypercarbic on admission started on BiPAP and it improved. She is a chronic retainer as she should use a BiPAP at bedtime, but she will need a formal sleeping study as an outpatient. She is on oral Lasix, will change her back to IV Lasix continue to monitor creatinine closely. We will restrict her fluids, change her to a low-sodium diet will not allow her to trace.  Hyperkalemia She was given calcium 8, sodium bicarb and Kayexalate on admission and her hyperkalemia resolved.  Syncope and collapse: Troponins negative denies any chest pain. Echocardiogram showed possible diastolic dysfunction. No events on telemetry. As speaking to the patient she relates she did not lose consciousness, when she fell. She does have severe bilateral knee osteoarthritis.  Obesity hypoventilation syndrome ([place]) She relates she is using her BiPAP.  Acute kidney injury CKD (chronic kidney disease) stage 3, GFR 30-59 ml/min (HCC) With a baseline creatinine around 1.51.6, will change her back to IV Lasix and continue to monitor basic metabolic panels regularly.   OSA (obstructive sleep apnea)   Essential hypertension    Gout  History of obstructive sleep apnea/obesity hypoventilation syndrome:  need to repeat it formal study as an outpatient, she is not using her BiPAP at home her weight has increased significantly over the last several years.   Gout: Continue allopurinol.  GERD: Continue Protonix   DVT prophylaxis: lovenox Family Communication:none Disposition Plan/Barrier to D/C:  Unable to determine Code Status:     Code Status Orders  (From admission, onward)        Start     Ordered   07/11/17 0201  Full code  Continuous     07/11/17 0200    Code Status History    Date Active Date Inactive Code Status Order ID Comments User Context   07/07/2017 1640 07/10/2017 1431 Full Code [id number]  Elodia Florence., MD Inpatient   01/22/2017 0346 01/23/2017 2101 Full Code [id number]  Jani Gravel, MD Inpatient   12/14/2015 0213 12/24/2015 1947 Full Code [id number]  Ivor Costa, MD ED   06/09/2015 0705 06/11/2015 2058 Full Code [id number]  Danford, Suann Larry, MD Inpatient   11/17/2014 1228 11/21/2014 1758 Partial Code [id number]  Juanito Doom, MD Inpatient   11/16/2014 0347 11/17/2014 1228 Full Code [id number]  Theressa Millard, MD Inpatient   01/14/2014 0137 01/23/2014 2028 Full Code [id number]  Allyne Gee, MD Inpatient   08/31/2013 0230 09/02/2013 1816 Full Code [id number]  Phillips Grout, MD Inpatient   08/08/2012 1043 08/11/2012 2044 Full Code [id number]  Modena Jansky, MD Inpatient   07/29/2012 0811 08/01/2012 1954 Full Code [id number]  Janece Canterbury, MD Inpatient   05/09/2012 0810 05/11/2012 2027 Full Code [id number]  Doutova,  Nyoka Lint, MD Inpatient   03/17/2012 0448 03/21/2012 2054 Full Code [id number]  Theressa Millard, MD ED   12/27/2011 0311 12/28/2011 2004 Full Code [id number]  Etta Quill., DO ED   08/25/2011 0314 08/27/2011 1739 Full Code [id number]  Charlestine Massed, RN Inpatient   05/13/2011 0604 05/19/2011 2348 Full Code [id number]  Susette Racer, RN ED        IV Access:     Peripheral IV   Procedures and diagnostic studies:   No results found.   Medical Consultants:    None.  Anti-Infectives:   None  Subjective:    Kara Vincent she relates her breathing is better she is still hurting in the chest and knee where she fell and hit her chest but her chest pain is reproducible by palpation.  Objective:    Vitals:   07/13/17 1938 07/13/17 2149 07/14/17 0401 07/14/17 0731  BP:  (!) 113/56 121/73   Pulse:  88 71   Resp:  18 (!) 22   Temp:  97.6 F (36.4 C) 98.3 F (36.8 C)   TempSrc:  Oral Oral   SpO2: 93% 93% 99% 97%  Weight:      Height:        Intake/Output Summary (Last 24 hours) at 07/14/2017 0851 Last data filed at 07/14/2017 0600 Gross per 24 hour  Intake 1440 ml  Output 300 ml  Net 1140 ml   Filed Weights   07/11/17 1645 07/12/17 0415  Weight: (!) 181.9 kg (401 lb) (!) 182.8 kg (403 lb)    Exam: General exam: In no acute distress, morbidly obese Respiratory system: Good air movement and clear to auscultation. Cardiovascular system: S1 & S2 heard, RRR. Gastrointestinal system: Abdomen is nondistended, soft and nontender.  Central nervous system: Alert and oriented. No focal neurological deficits. Extremities: No pedal edema. Skin: No rashes, lesions or ulcers  Data Reviewed:    Labs: Basic Metabolic Panel: Recent Labs  Lab 07/09/17 0356 07/10/17 0436 07/10/17 2046 07/10/17 2056 07/11/17 0535 07/12/17 0339 07/13/17 0502 07/14/17 0517  NA 140 139 140 137 142 143 144 143  K 5.2* 5.5* 6.4* 6.1* 4.6 4.8 4.5 4.1  CL 96* 97* 100* 98* 97* 95* 94* 93*  CO2 33* 32 32  --  33* 35* 37* 38*  GLUCOSE 111* 114* 109* 107* 94 113* 93 161*  BUN 43* 44* 44* 41* 39* 39* 37* 33*  CREATININE 2.27* 2.07* 2.19* 2.10* 1.93* 1.79* 1.68* 1.89*  CALCIUM 8.5* 8.6* 8.5*  --  8.6* 8.4* 8.2* 8.3*  MG 2.1 2.1  --   --   --   --   --   --    GFR Estimated Creatinine Clearance: 58.3 mL/min (A) (by C-G formula based on SCr of  1.89 mg/dL (H)). Liver Function Tests: Recent Labs  Lab 07/08/17 0412 07/10/17 2046 07/11/17 0535  AST 11* 21 21  ALT 11* 14 14  ALKPHOS 55 63 57  BILITOT 0.7 0.6 0.5  PROT 6.8 7.4 6.7  ALBUMIN 3.5 3.8 3.4*   No results for input(s): LIPASE, AMYLASE in the last 168 hours. No results for input(s): AMMONIA in the last 168 hours. Coagulation profile No results for input(s): INR, PROTIME in the last 168 hours.  CBC: Recent Labs  Lab 07/08/17 0412 07/10/17 2046 07/10/17 2056 07/11/17 0535 07/14/17 0517  WBC 8.9 9.0  --  8.0 8.1  NEUTROABS  --  6.7  --   --   --  HGB 12.3 11.7* 12.6 11.9* 11.4*  HCT 41.3 38.1 37.0 40.0 38.4  MCV 100.0 99.7  --  100.5* 100.3*  PLT 210 194  --  187 192   Cardiac Enzymes: Recent Labs  Lab 07/11/17 0535 07/11/17 1035  TROPONINI <0.03 <0.03   BNP (last 3 results) No results for input(s): PROBNP in the last 8760 hours. CBG: No results for input(s): GLUCAP in the last 168 hours. D-Dimer: No results for input(s): DDIMER in the last 72 hours. Hgb A1c: No results for input(s): HGBA1C in the last 72 hours. Lipid Profile: No results for input(s): CHOL, HDL, LDLCALC, TRIG, CHOLHDL, LDLDIRECT in the last 72 hours. Thyroid function studies: No results for input(s): TSH, T4TOTAL, T3FREE, THYROIDAB in the last 72 hours.  Invalid input(s): FREET3 Anemia work up: No results for input(s): VITAMINB12, FOLATE, FERRITIN, TIBC, IRON, RETICCTPCT in the last 72 hours. Sepsis Labs: Recent Labs  Lab 07/08/17 0412 07/10/17 2046 07/11/17 0535 07/14/17 0517  WBC 8.9 9.0 8.0 8.1   Microbiology Recent Results (from the past 240 hour(s))  MRSA PCR Screening     Status: Abnormal   Collection Time: 07/07/17  5:36 PM  Result Value Ref Range Status   MRSA by PCR POSITIVE (A) NEGATIVE Final    Comment:        The GeneXpert MRSA Assay (FDA approved for NASAL specimens only), is one component of a comprehensive MRSA colonization surveillance program.  It is not intended to diagnose MRSA infection nor to guide or monitor treatment for MRSA infections. RESULT CALLED TO, READ BACK BY AND VERIFIED WITH: SEAGRAVES,A. RN @1942  ON 04.16.19 BY COHEN,K Performed at [facility], [place] [address] [place], [place] [address]      Medications:   . allopurinol  100 mg Oral q morning - 10a  . aspirin  325 mg Oral q morning - 10a  . atorvastatin  20 mg Oral q morning - 10a  . darifenacin  7.5 mg Oral Daily  . docusate sodium  100 mg Oral BID  . enoxaparin (LOVENOX) injection  90 mg Subcutaneous Q24H  . fluticasone furoate-vilanterol  1 puff Inhalation Daily  . folic acid  1 mg Oral Daily  . furosemide  60 mg Oral BID  . gabapentin  100 mg Oral BID  . ipratropium-albuterol  3 mL Nebulization TID  . ketoconazole  1 application Topical Once per day on Mon Thu  . nystatin cream   Topical TID  . omega-3 acid ethyl esters  1 g Oral Daily  . pantoprazole  40 mg Oral q morning - 10a  . sodium chloride flush  3 mL Intravenous Q12H  . [START ON 07/16/2017] Vitamin D (Ergocalciferol)  50,000 Units Oral Weekly   Continuous Infusions: . sodium chloride        LOS: 4 days   [facility] Pager [phone]  *Please refer to [email or web address], password [email or web address] to get updated schedule on who will round on this patient, as hospitalists switch teams weekly. If 7PM-7AM, please contact night-coverage at [email or web address], password [email or web address] for any overnight needs.  07/14/2017, 8:51 AM

## 2017-07-15 DIAGNOSIS — G4733 Obstructive sleep apnea (adult) (pediatric): Secondary | ICD-10-CM

## 2017-07-15 DIAGNOSIS — J9601 Acute respiratory failure with hypoxia: Secondary | ICD-10-CM

## 2017-07-15 DIAGNOSIS — J9621 Acute and chronic respiratory failure with hypoxia: Secondary | ICD-10-CM

## 2017-07-15 DIAGNOSIS — E662 Morbid (severe) obesity with alveolar hypoventilation: Secondary | ICD-10-CM

## 2017-07-15 DIAGNOSIS — E875 Hyperkalemia: Secondary | ICD-10-CM

## 2017-07-15 DIAGNOSIS — J9622 Acute and chronic respiratory failure with hypercapnia: Secondary | ICD-10-CM

## 2017-07-15 DIAGNOSIS — R079 Chest pain, unspecified: Secondary | ICD-10-CM

## 2017-07-15 DIAGNOSIS — K219 Gastro-esophageal reflux disease without esophagitis: Secondary | ICD-10-CM

## 2017-07-15 DIAGNOSIS — I10 Essential (primary) hypertension: Secondary | ICD-10-CM

## 2017-07-15 MED ORDER — IPRATROPIUM-ALBUTEROL 0.5-2.5 (3) MG/3ML IN SOLN
3.0000 mL | Freq: Two times a day (BID) | RESPIRATORY_TRACT | Status: DC
  Administered 2017-07-15 – 2017-07-16 (×2): 3 mL via RESPIRATORY_TRACT
  Filled 2017-07-15 (×2): qty 3

## 2017-07-15 MED ORDER — SUMATRIPTAN SUCCINATE 25 MG PO TABS
25.0000 mg | ORAL_TABLET | Freq: Once | ORAL | Status: AC
  Administered 2017-07-15: 25 mg via ORAL
  Filled 2017-07-15: qty 1

## 2017-07-15 MED ORDER — FUROSEMIDE 40 MG PO TABS
40.0000 mg | ORAL_TABLET | Freq: Two times a day (BID) | ORAL | Status: DC
  Administered 2017-07-15 – 2017-07-16 (×2): 40 mg via ORAL
  Filled 2017-07-15 (×2): qty 1

## 2017-07-15 NOTE — Care Management Note (Signed)
Case Management Note  Patient Details  Name: SANYE LEDESMA MRN: [id number] Date of Birth: Dec 22, 1965  Subjective/Objective: See prior CM note also(it adresses the CPAP issues). [facility] already active-rep Santiago Glad aware of [place] orders-HHRN/PT/OT/aide/social worker,respiratory care, & home rw. Has home 02 w/[facility] will deliver travel tank, rw to rm prior d/c. No further CM needs.                    Action/Plan:d/c home w/HHC/dme.   Expected Discharge Date:  (unknown)               Expected Discharge Plan:  [place]  In-House Referral:     Discharge planning Services  CM Consult  Post Acute Care Choice:  Durable Medical Equipment(Active w/[facility] HHPT/OT;[facility] home 02,3n1) Choice offered to:     DME Arranged:  Walker rolling DME Agency:     HH Arranged:  PT, RN, OT, Nurse's Aide, Respirator Therapy, Social Work [employer] Agency:  [place]  Status of Service:  Completed, signed off  If discussed at [employer], dates discussed:    Additional Comments:  Dessa Phi, RN 07/15/2017, 12:56 PM

## 2017-07-15 NOTE — Progress Notes (Signed)
Occupational Therapy Treatment Patient Details Name: Kara Vincent MRN: [id number] DOB: 04-25-1965 Today's Date: 07/15/2017    History of present illness 52 year old female with past medical history of morbid obesity, asthma, obstructive sleep apnea, obesity, hypoventilation syndrome, diastolic CHF, mild aortic stenosis, CKD stage III who presented with the emergency department after  she fell out of wheelchair. Per pt report she was waiting at the bus stop with her oxygen on her wheelchair when she felt dyspneic and fell. Pt was admitted for the management of acute on chronic respiratory failure with hypoxia and hypercarbia. Of note pt was recently admitted and discharged on 4/19 after treatment for CHF exacerbation   OT comments  Pt making progress with functional goals. Provided pt with ADL A/E education and kit  for home use. OT will continue to follow  Follow Up Recommendations  Supervision/Assistance - 24 hour    Equipment Recommendations  3 in 1 bedside commode;Other (comment)(bariatric BSC)    Recommendations for Other Services      Precautions / Restrictions Precautions Precautions: Fall Precaution Comments: oxygen dependent Restrictions Weight Bearing Restrictions: No       Mobility Bed Mobility Overal bed mobility: Needs Assistance Bed Mobility: Sit to Supine       Sit to supine: Supervision;HOB elevated   General bed mobility comments: pt on BSC upon arrival. Assisted back to bed by OT  Transfers Overall transfer level: Needs assistance Equipment used: Rolling walker (2 wheeled) Transfers: Sit to/from [facility] Sit to Stand: Min guard Stand pivot transfers: Min guard       General transfer comment: holds onto the [facility] armrests to turn and sit, goes back to bed with rail of bed.    Balance Overall balance assessment: Needs assistance   Sitting balance-Leahy Scale: Good     Standing balance support: Bilateral upper extremity  supported;During functional activity Standing balance-Leahy Scale: Poor                             ADL either performed or assessed with clinical judgement   ADL Overall ADL's : Needs assistance/impaired     Grooming: Sitting;Modified independent   Upper Body Bathing: Set up;Supervision/ safety;Sitting   Lower Body Bathing: Moderate assistance;Sit to/from stand   Upper Body Dressing : Set up;Supervision/safety;Sitting       Toilet Transfer: Min guard;RW;Comfort height toilet;Stand-pivot   Toileting- Clothing Manipulation and Hygiene: Maximal assistance;Sit to/from stand       Functional mobility during ADLs: Min guard       Vision Patient Visual Report: No change from baseline     Perception     Praxis      Cognition Arousal/Alertness: Awake/alert Behavior During Therapy: WFL for tasks assessed/performed Overall Cognitive Status: Within Functional Limits for tasks assessed                                          Exercises     Shoulder Instructions       General Comments      Pertinent Vitals/ Pain       Pain Assessment: No/denies pain Pain Intervention(s): Monitored during session  Home Living  Prior Functioning/Environment              Frequency  Min 2X/week        Progress Toward Goals  OT Goals(current goals can now be found in the care plan section)  Progress towards OT goals: Progressing toward goals     Plan Discharge plan remains appropriate    Co-evaluation                 AM-PAC PT "6 Clicks" Daily Activity     Outcome Measure   Help from another person eating meals?: None Help from another person taking care of personal grooming?: None Help from another person toileting, which includes using toliet, bedpan, or urinal?: A Lot Help from another person bathing (including washing, rinsing, drying)?: A Lot Help from another  person to put on and taking off regular upper body clothing?: None Help from another person to put on and taking off regular lower body clothing?: A Lot 6 Click Score: 18    End of Session Equipment Utilized During Treatment: Oxygen;Other (comment)(BSC)  OT Visit Diagnosis: Muscle weakness (generalized) (M62.81)   Activity Tolerance Patient limited by fatigue   Patient Left with call bell/phone within reach;in bed   Nurse Communication      Functional Assessment Tool Used: AM-PAC 6 Clicks Daily Activity   Time: 1225-1310 OT Time Calculation (min): 45 min  Charges: OT G-codes **NOT FOR INPATIENT CLASS** Functional Assessment Tool Used: AM-PAC 6 Clicks Daily Activity OT General Charges $OT Visit: 1 Visit OT Treatments $Self Care/Home Management : 8-22 mins $Therapeutic Activity: 23-37 mins     Kara Vincent 07/15/2017, 3:29 PM

## 2017-07-15 NOTE — Progress Notes (Signed)
PROGRESS NOTE    Kara Vincent  QQI:[id number] DOB: December 08, 1965 DOA: 07/10/2017 PCP: Patient, No Pcp Per    Brief Narrative:  52 year old female who presented with headache and a mechanical fall.  She does have significant past medical history of asthma, OSA, diastolic heart failure, aortic stenosis, and chronic kidney disease stage III.  Patient had a recent hospitalization for diastolic heart failure decompensation.  Apparently she suffered a mechanical fall from her wheelchair while sitting, significant prodromal dyspnea, positive head trauma with no clear loss of consciousness.  On initial physical examination blood pressure 105/57, heart rate 80, temperature 97.9, respiratory rate 20, oxygen saturation 93%.  JVD, positive bibasilar Rales, no wheezing, heart S1-S2 present and rhythmic, abdomen soft nontender, no lower extremity edema.  Sodium 140, potassium 6.4, chloride 100, bicarb 32, glucose 109, BUN 24, creatinine 2.19.  His venous blood gas 7.24/ 82.9/ 47.7/ 34.3/ 76%, white count 9.0, hemoglobin 9.7, hematocrit 38.1 platelets 184.  Head and cervical spine CT no acute changes.  Chest x-ray with cardiomegaly, bilateral interstitial markings.  EKG normal sinus rhythm, normal axis, poor R wave progression.   Patient was admitted to the hospital with the working diagnosis of decompensated diastolic heart failure complicated by hyperkalemia and acute on chronic hypercapnic respiratory failure.  .   Assessment & Plan:   Principal Problem:   Hyperkalemia Active Problems:   Obesity hypoventilation syndrome (HCC)   Acute on chronic respiratory failure with hypoxia and hypercapnia (HCC)   CKD (chronic kidney disease) stage 3, GFR 30-59 ml/min (HCC)   OSA (obstructive sleep apnea)   Essential hypertension   Gout   Syncope and collapse  1. Acute on chronic diastolic heart failure. Patient has responded well to diuresis, will continue furosemide po 40 mg po bid to keep negative fluid balance,  continue blood pressure control. Patient with very poor physical functional capacity due to obesity.   2. Acute on chronic hypoxic and hypercapnic respiratory failure. Continue diuresis and bronchodilator therapy. Supplemental 02 per [place], case manager working on Cpap, may need outpatient sleep study to have machine approved.   3. Hyperkalemia in the setting of CKD stage 3. Improved serum K, will continue close monitor of renal function and electrolytes.   4. OSA. Will need close follow up as outpatient.   5. Morbid obesity. Case manager consulted for home health services, walker, shower chair and portable 02.   6. Headache. Migrane, will try sumatriptan.    DVT prophylaxis: enoxaparin   Code Status:  full Family Communication: no family at the bedside  Disposition Plan: home in am if continue stable   Consultants:     Procedures:     Antimicrobials:       Subjective: Patient with improvement in her symptoms, not back to baseline, positive edema and headache, positive thirst. No nausea or vomiting.   Objective: Vitals:   07/15/17 0055 07/15/17 0500 07/15/17 0511 07/15/17 0915  BP:   136/70   Pulse: 87  85   Resp: 16  20   Temp:   98.3 F (36.8 C)   TempSrc:   Oral   SpO2: 93%  94% 94%  Weight:  (!) 180.5 kg (397 lb 14.4 oz)    Height:        Intake/Output Summary (Last 24 hours) at 07/15/2017 1157 Last data filed at 07/15/2017 1000 Gross per 24 hour  Intake 1660 ml  Output 3300 ml  Net -1640 ml   Filed Weights   07/11/17 1645 07/12/17  4825 07/15/17 0500  Weight: (!) 181.9 kg (401 lb) (!) 182.8 kg (403 lb) (!) 180.5 kg (397 lb 14.4 oz)    Examination:   General: deconditioned  Neurology: somnolent but non focal  E ENT: mild pallor, no icterus, oral mucosa moist Cardiovascular: wide neck with no evident JVD. S1-S2 present, rhythmic, no gallops, rubs, or murmurs. Non pitting lower extremity edema ++. Pulmonary: decreased breath sounds bilaterally, decreased  air movement, no wheezing, rhonchi or rales. Gastrointestinal. Abdomen protuberant, no organomegaly, non tender, no rebound or guarding Skin. Left arm erythema.  Musculoskeletal: no joint deformities     Data Reviewed: I have personally reviewed following labs and imaging studies  CBC: Recent Labs  Lab 07/10/17 2046 07/10/17 2056 07/11/17 0535 07/14/17 0517  WBC 9.0  --  8.0 8.1  NEUTROABS 6.7  --   --   --   HGB 11.7* 12.6 11.9* 11.4*  HCT 38.1 37.0 40.0 38.4  MCV 99.7  --  100.5* 100.3*  PLT 194  --  187 003   Basic Metabolic Panel: Recent Labs  Lab 07/09/17 0356 07/10/17 0436 07/10/17 2046 07/10/17 2056 07/11/17 0535 07/12/17 0339 07/13/17 0502 07/14/17 0517  NA 140 139 140 137 142 143 144 143  K 5.2* 5.5* 6.4* 6.1* 4.6 4.8 4.5 4.1  CL 96* 97* 100* 98* 97* 95* 94* 93*  CO2 33* 32 32  --  33* 35* 37* 38*  GLUCOSE 111* 114* 109* 107* 94 113* 93 161*  BUN 43* 44* 44* 41* 39* 39* 37* 33*  CREATININE 2.27* 2.07* 2.19* 2.10* 1.93* 1.79* 1.68* 1.89*  CALCIUM 8.5* 8.6* 8.5*  --  8.6* 8.4* 8.2* 8.3*  MG 2.1 2.1  --   --   --   --   --   --    GFR: Estimated Creatinine Clearance: 57.8 mL/min (A) (by C-G formula based on SCr of 1.89 mg/dL (H)). Liver Function Tests: Recent Labs  Lab 07/10/17 2046 07/11/17 0535  AST 21 21  ALT 14 14  ALKPHOS 63 57  BILITOT 0.6 0.5  PROT 7.4 6.7  ALBUMIN 3.8 3.4*   No results for input(s): LIPASE, AMYLASE in the last 168 hours. No results for input(s): AMMONIA in the last 168 hours. Coagulation Profile: No results for input(s): INR, PROTIME in the last 168 hours. Cardiac Enzymes: Recent Labs  Lab 07/11/17 0535 07/11/17 1035  TROPONINI <0.03 <0.03   BNP (last 3 results) No results for input(s): PROBNP in the last 8760 hours. HbA1C: No results for input(s): HGBA1C in the last 72 hours. CBG: No results for input(s): GLUCAP in the last 168 hours. Lipid Profile: No results for input(s): CHOL, HDL, LDLCALC, TRIG, CHOLHDL,  LDLDIRECT in the last 72 hours. Thyroid Function Tests: No results for input(s): TSH, T4TOTAL, FREET4, T3FREE, THYROIDAB in the last 72 hours. Anemia Panel: No results for input(s): VITAMINB12, FOLATE, FERRITIN, TIBC, IRON, RETICCTPCT in the last 72 hours.    Radiology Studies: I have reviewed all of the imaging during this hospital visit personally     Scheduled Meds: . allopurinol  100 mg Oral q morning - 10a  . aspirin  325 mg Oral q morning - 10a  . atorvastatin  20 mg Oral q morning - 10a  . darifenacin  7.5 mg Oral Daily  . docusate sodium  100 mg Oral BID  . enoxaparin (LOVENOX) injection  90 mg Subcutaneous Q24H  . fluticasone furoate-vilanterol  1 puff Inhalation Daily  . folic acid  1  mg Oral Daily  . furosemide  80 mg Intravenous BID  . gabapentin  100 mg Oral BID  . ipratropium-albuterol  3 mL Nebulization BID  . ketoconazole  1 application Topical Once per day on Mon Thu  . nystatin cream   Topical TID  . pantoprazole  40 mg Oral q morning - 10a  . sodium chloride flush  3 mL Intravenous Q12H  . [START ON 07/16/2017] Vitamin D (Ergocalciferol)  50,000 Units Oral Weekly   Continuous Infusions: . sodium chloride       LOS: 5 days        Mauricio Gerome Apley, MD [facility] Pager [phone]

## 2017-07-16 DIAGNOSIS — N183 Chronic kidney disease, stage 3 (moderate): Secondary | ICD-10-CM

## 2017-07-16 LAB — BASIC METABOLIC PANEL
ANION GAP: 15 (ref 5–15)
BUN: 35 mg/dL — AB (ref 6–20)
CO2: 39 mmol/L — ABNORMAL HIGH (ref 22–32)
Calcium: 8.6 mg/dL — ABNORMAL LOW (ref 8.9–10.3)
Chloride: 86 mmol/L — ABNORMAL LOW (ref 101–111)
Creatinine, Ser: 1.71 mg/dL — ABNORMAL HIGH (ref 0.44–1.00)
GFR calc Af Amer: 38 mL/min — ABNORMAL LOW (ref >60)
GFR, EST NON AFRICAN AMERICAN: 33 mL/min — AB (ref >60)
GLUCOSE: 92 mg/dL (ref 65–99)
POTASSIUM: 3.7 mmol/L (ref 3.5–5.1)
SODIUM: 140 mmol/L (ref 135–145)

## 2017-07-16 MED ORDER — FUROSEMIDE 40 MG PO TABS: 40.0000 mg | ORAL_TABLET | Freq: Two times a day (BID) | ORAL | 0 refills | Status: DC

## 2017-07-16 MED ORDER — ALLOPURINOL 100 MG PO TABS: 100.0000 mg | ORAL_TABLET | Freq: Every morning | ORAL | 0 refills | Status: DC

## 2017-07-16 MED ORDER — NYSTATIN 100000 UNIT/GM EX CREA: TOPICAL_CREAM | CUTANEOUS | 0 refills | Status: DC

## 2017-07-16 MED ORDER — VITAMIN D (ERGOCALCIFEROL) 1.25 MG (50000 UNIT) PO CAPS: 50000.0000 [IU] | ORAL_CAPSULE | ORAL | 0 refills | Status: DC

## 2017-07-16 MED ORDER — IPRATROPIUM-ALBUTEROL 0.5-2.5 (3) MG/3ML IN SOLN: 3.0000 mL | Freq: Four times a day (QID) | RESPIRATORY_TRACT | 0 refills | Status: DC | PRN

## 2017-07-16 MED ORDER — ALBUTEROL SULFATE HFA 108 (90 BASE) MCG/ACT IN AERS: 1.0000 | INHALATION_SPRAY | Freq: Four times a day (QID) | RESPIRATORY_TRACT | 0 refills | Status: DC | PRN

## 2017-07-16 MED ORDER — ATORVASTATIN CALCIUM 20 MG PO TABS: 20.0000 mg | ORAL_TABLET | Freq: Every morning | ORAL | 0 refills | Status: DC

## 2017-07-16 MED ORDER — ISOSORBIDE MONONITRATE ER 30 MG PO TB24: 30.0000 mg | ORAL_TABLET | Freq: Every day | ORAL | 0 refills | Status: DC

## 2017-07-16 MED ORDER — ASPIRIN EC 81 MG PO TBEC: 81.0000 mg | DELAYED_RELEASE_TABLET | Freq: Every day | ORAL | 0 refills | Status: AC

## 2017-07-16 MED ORDER — BUTALBITAL-APAP-CAFFEINE 50-325-40 MG PO TABS: 1.0000 | ORAL_TABLET | Freq: Four times a day (QID) | ORAL | 0 refills | Status: DC | PRN

## 2017-07-16 MED ORDER — KETOCONAZOLE 2 % EX SHAM: 1.0000 "application " | MEDICATED_SHAMPOO | CUTANEOUS | 0 refills | Status: DC

## 2017-07-16 MED ORDER — BUDESONIDE-FORMOTEROL FUMARATE 160-4.5 MCG/ACT IN AERO: 2.0000 | INHALATION_SPRAY | Freq: Two times a day (BID) | RESPIRATORY_TRACT | 0 refills | Status: DC

## 2017-07-16 MED ORDER — GABAPENTIN 100 MG PO CAPS: 100.0000 mg | ORAL_CAPSULE | Freq: Two times a day (BID) | ORAL | 0 refills | Status: DC

## 2017-07-16 MED ORDER — BENZONATATE 100 MG PO CAPS: 100.0000 mg | ORAL_CAPSULE | Freq: Three times a day (TID) | ORAL | 0 refills | Status: DC | PRN

## 2017-07-16 MED ORDER — AMMONIUM LACTATE 12 % EX CREA: 1.0000 g | TOPICAL_CREAM | Freq: Every day | CUTANEOUS | 0 refills | Status: DC | PRN

## 2017-07-16 NOTE — Progress Notes (Signed)
Pt d/c to home. Discharge instructions, reasons to return to ED/MD, after visit care and prescriptions reviewed with pt. Pt first asked that medications be sent to [facility], but then later requested that medications be sent to a delivery pharmacy but could not recall the name. She said it sounded like "[facility]" but no pharmacy information given. I advised the patient to get the information of the delivery pharmacy and ask them to transfer the prescriptions from [facility]. No paper prescriptions given to patient. All medications sent electronically to [facility] by MD. PIV removed without complication. Pt escorted off of unit in her own wheelchair.

## 2017-07-16 NOTE — Discharge Summary (Addendum)
Physician Discharge Summary  Kara Vincent NWG:[id number] DOB: Nov 03, 1965 DOA: 07/10/2017  PCP: Patient, No Pcp Per  Admit date: 07/10/2017 Discharge date: 07/16/2017  Admitted From: home Disposition:  home  Recommendations for Outpatient Follow-up and new medication changes:  1. Follow up with PCP in 1- week 2. Furosemide has been increased to 40 mg po bid 3. Will need outpatient referral for sleep study. 4. Patient will benefit from outpatient referral for pain clinic. 5. Caution with narcotics and benzodiazepines  Home Health: yes  Equipment/Devices: Rolling walker, bedside commode, shower chair, portable oxygen.    Discharge Condition: stable CODE STATUS: full  Diet recommendation: Heart healthy  Brief/Interim Summary: 52 year old female who presented with headache and a mechanical fall.  She does have a significant past medical history of asthma, OSA, diastolic heart failure, aortic stenosis, and chronic kidney disease stage III.  Patient had a recent hospitalization for diastolic heart failure decompensation.  Apparently she suffered a mechanical fall from her wheelchair while sitting, significant prodromal dyspnea, positive head trauma with no clear loss of consciousness.  On initial physical examination blood pressure 105/57, heart rate 80, temperature 97.9, respiratory rate 20, oxygen saturation 93%. No evident JVD, positive bibasilar rales, no wheezing, heart S1-S2 present and rhythmic, abdomen protuberant, soft and nontender, non pitting lower extremity edema.  Sodium 140, potassium 6.4, chloride 100, bicarb 32, glucose 109, BUN 24, creatinine 2.19.  Venous blood gas 7.24/ 82.9/ 47.7/ 34.3/ 76%, white count 9.0, hemoglobin 9.7, hematocrit 38.1 platelets 184.  Head and cervical spine CT no acute changes.  Chest x-ray with cardiomegaly, bilateral interstitial markings.  EKG normal sinus rhythm, normal axis, poor R wave progression.   Patient was admitted to the hospital with the  working diagnosis of decompensated diastolic heart failure complicated by hyperkalemia and acute on chronic hypercapnic/ hypoxic respiratory failure.   1.  Acute on chronic diastolic heart failure.  Patient was admitted to the medical ward, she was placed on remote telemetry monitor, diuresis with loop diuretics intravenously, negative fluid balance was achieved (-4000 ml since admission), with improvement of her symptoms.  Patient continued to complain of fatigue and dyspnea likely related to morbid obesity and overall deconditioning.  Patient will be discharged on furosemide 40 mg twice daily.  Recent echocardiogram had limited quality due to body habitus, left ventricle appeared normal in size and systolic function.  The right ventricle was dilated but with normal normal systolic function.  Patient will continue isosorbide 30 mg daily.  2.  Acute on chronic hypoxic and hypercapnic respiratory failure, in the setting of OSA and acute cardiogenic pulmonary edema (present on admission). Patient responded well to diuresis, she received bronchodilator therapy and submental oxygen per nasal cannula.  Case manager was consulted provide home health services.  Patient will need outpatient follow-up, sleep study to rearrange CPAP.  Apparently she lost her CPAP as an outpatient.  Extreme caution with narcotics is advised.   3.  Acute kidney injury on chronic kidney disease stage III complicated by hyperkalemia.  Patient was diuresed with furosemide, responded well to diuresis, her discharge creatinine 1.71, potassium 3.7, serum bicarbonate 39.  Instructed to continue furosemide 40 g twice daily, will get a close follow-up kidney function and electrolytes.  Avoid NSAIDs.   4.  Morbid obesity and obstructive sleep apnea.  Patient will need outpatient follow-up, outpatient sleep study and aggressive lifestyle modification, home health services have been arranged.  5.  Chronic pain syndrome.  Patient will benefit from  outpatient pain  clinic referral.  Currently patient taken hydrocodone/acetaminophen with good toleration, will recommend to slowly discontinue opiates due to risk of respiratory failure.   6.  Gout idiopathic, with non specific joint involvement .  No active flare, continue allopurinol.  Discharge Diagnoses:  Principal Problem:   Hyperkalemia Active Problems:   Obesity hypoventilation syndrome (HCC)   Acute on chronic respiratory failure with hypoxia and hypercapnia (HCC)   CKD (chronic kidney disease) stage 3, GFR 30-59 ml/min (HCC)   OSA (obstructive sleep apnea)   Essential hypertension   Gout   Syncope and collapse    Discharge Instructions  Discharge Instructions    Polysomnography 4 or more parameters   Complete by:  Jul 20, 2017    Where should this test be performed:  [facility] - Pulmonary     Allergies as of 07/16/2017      Reactions   Ibuprofen Hives, Shortness Of Breath, Palpitations, Rash   Has tolerated toradol 12/2013 inpatient   Penicillins Anaphylaxis, Hives, Rash   Has patient had a PCN reaction causing immediate rash, facial/tongue/throat swelling, SOB or lightheadedness with hypotension: Yes Has patient had a PCN reaction causing severe rash involving mucus membranes or skin necrosis: Yes Has patient had a PCN reaction that required hospitalization Unknown Has patient had a PCN reaction occurring within the last 10 years: Unknown If all of the above answers are "NO", then may proceed with Cephalosporin use.   Sulfa Antibiotics Hives, Shortness Of Breath, Rash, Cough   Adhesive [tape] Rash   Doxycycline Hives, Swelling, Rash   Sulfamethoxazole-trimethoprim Rash   Zithromax [azithromycin] Hives, Swelling, Rash      Medication List    STOP taking these medications   aspirin 325 MG tablet Replaced by:  aspirin EC 81 MG tablet   dextromethorphan-guaiFENesin 30-600 MG 12hr tablet Commonly known as:  MUCINEX DM   FISH OIL PO   folic acid 1 MG tablet Commonly  known as:  FOLVITE   pantoprazole 40 MG tablet Commonly known as:  PROTONIX     TAKE these medications   albuterol 108 (90 Base) MCG/ACT inhaler Commonly known as:  PROVENTIL HFA;VENTOLIN HFA Inhale 1-2 puffs into the lungs every 6 (six) hours as needed for wheezing or shortness of breath.   allopurinol 100 MG tablet Commonly known as:  ZYLOPRIM Take 1 tablet (100 mg total) by mouth every morning.   ALPRAZolam 1 MG tablet Commonly known as:  XANAX Take 1 mg by mouth 3 (three) times daily as needed for anxiety (anxiety).   ammonium lactate 12 % cream Commonly known as:  AMLACTIN Apply 1 g topically daily as needed for dry skin.   aspirin EC 81 MG tablet Take 1 tablet (81 mg total) by mouth daily. Replaces:  aspirin 325 MG tablet   atorvastatin 20 MG tablet Commonly known as:  LIPITOR Take 1 tablet (20 mg total) by mouth every morning.   benzonatate 100 MG capsule Commonly known as:  TESSALON Take 1 capsule (100 mg total) by mouth 3 (three) times daily as needed for cough.   budesonide-formoterol 160-4.5 MCG/ACT inhaler Commonly known as:  SYMBICORT Inhale 2 puffs into the lungs 2 (two) times daily.   butalbital-acetaminophen-caffeine 50-325-40 MG tablet Commonly known as:  FIORICET, ESGIC Take 1 tablet by mouth every 6 (six) hours as needed for headache.   eucerin lotion Apply 5 mLs topically 2 (two) times daily as needed for dry skin.   furosemide 40 MG tablet Commonly known as:  LASIX Take 1  tablet (40 mg total) by mouth 2 (two) times daily. What changed:    medication strength  how much to take   gabapentin 100 MG capsule Commonly known as:  NEURONTIN Take 1 capsule (100 mg total) by mouth 2 (two) times daily.   HYDROcodone-acetaminophen 10-325 MG tablet Commonly known as:  NORCO Take 1 tablet by mouth every 4 (four) hours as needed for moderate pain.   hydrOXYzine 25 MG tablet Commonly known as:  ATARAX/VISTARIL Take 25 mg by mouth 3 (three) times  daily as needed for anxiety or itching (anxiety and itching).   ipratropium-albuterol 0.5-2.5 (3) MG/3ML Soln Commonly known as:  DUONEB Take 3 mLs by nebulization every 6 (six) hours as needed (SOB).   isosorbide mononitrate 30 MG 24 hr tablet Commonly known as:  IMDUR Take 1 tablet (30 mg total) by mouth daily.   ketoconazole 2 % shampoo Commonly known as:  NIZORAL Apply 1 application topically 2 (two) times a week.   nystatin cream Commonly known as:  MYCOSTATIN Apply to affected area 2 times daily   solifenacin 5 MG tablet Commonly known as:  VESICARE Take 1 tablet (5 mg total) by mouth every morning.   Vitamin D (Ergocalciferol) 50000 units Caps capsule Commonly known as:  DRISDOL Take 1 capsule (50,000 Units total) by mouth once a week.            Durable Medical Equipment  (From admission, onward)        Start     Ordered   07/15/17 1313  For home use only DME Shower stool  Once     07/15/17 1312   07/15/17 1249  For home use only DME oxygen  Once    Comments:  Please provide small portable oxygen tank.  Question Answer Comment  Mode or (Route) Nasal cannula   Frequency Continuous (stationary and portable oxygen unit needed)   Oxygen delivery system Gas      07/15/17 1248   07/15/17 1248  For home use only DME Walker  Once    Question:  Patient needs a walker to treat with the following condition  Answer:  Ambulatory dysfunction   07/15/17 [facility] Follow up.   Why:  home rolling walker Contact information: [address] [place] [address] [phone]        [facility]-Home Follow up.   Specialty:  Home Health Services Why:  [facility] nursing/physical therapy,occupational therapy,aide,social worker,respiratory care Contact information: [place] [address] [phone]          Allergies  Allergen Reactions  . Ibuprofen Hives, Shortness  Of Breath, Palpitations and Rash    Has tolerated toradol 12/2013 inpatient  . Penicillins Anaphylaxis, Hives and Rash    Has patient had a PCN reaction causing immediate rash, facial/tongue/throat swelling, SOB or lightheadedness with hypotension: Yes Has patient had a PCN reaction causing severe rash involving mucus membranes or skin necrosis: Yes Has patient had a PCN reaction that required hospitalization Unknown Has patient had a PCN reaction occurring within the last 10 years: Unknown If all of the above answers are "NO", then may proceed with Cephalosporin use.   . Sulfa Antibiotics Hives, Shortness Of Breath, Rash and Cough  . Adhesive [Tape] Rash  . Doxycycline Hives, Swelling and Rash  . Sulfamethoxazole-Trimethoprim Rash  . Zithromax [Azithromycin] Hives, Swelling and Rash    Consultations:  Procedures/Studies: Dg Chest 2 View  Result Date: 07/10/2017 CLINICAL DATA:  Short of breath after a fall. EXAM: CHEST - 2 VIEW COMPARISON:  07/06/2017 FINDINGS: Both views are degraded by patient body habitus. Frontal view is oblique, apical lordotic positioning. Cardiomegaly accentuated by AP portable technique. No definite pleural fluid. No pneumothorax. Low lung volumes with resultant pulmonary interstitial prominence. No well-defined lobar consolidation. Lung bases not well evaluated, especially on the frontal radiograph. IMPRESSION: Moderately degraded exam, as detailed above. Cardiomegaly and low lung volumes.  No definite acute disease. Electronically Signed   By: Abigail Miyamoto M.D.   On: 07/10/2017 20:03   Dg Chest 2 View  Result Date: 07/06/2017 CLINICAL DATA:  Short of breath EXAM: CHEST - 2 VIEW COMPARISON:  05/08/2017 FINDINGS: No significant pleural effusion. Cardiomegaly with vascular congestion. No focal consolidation. No pneumothorax. IMPRESSION: Cardiomegaly with vascular congestion. No significant change compared to prior. Electronically Signed   By: Donavan Foil M.D.    On: 07/06/2017 22:48   Dg Knee 1-2 Views Right  Result Date: 07/08/2017 CLINICAL DATA:  Chronic right knee pain.  No known injury. EXAM: RIGHT KNEE - 1-2 VIEW COMPARISON:  Plain films of the right knee 01/02/2008. FINDINGS: Advanced for age tricompartmental osteoarthritis shows some progression since the prior examination. No acute bony or joint abnormality is seen. No joint effusion. A lobulated calcification measuring 1.2 cm in diameter posterior to the proximal tibia is likely a loose body in a Baker's cyst. Soft tissues are unremarkable. IMPRESSION: No acute finding. Advanced for age tricompartmental osteoarthritis has worsened since 2009. Calcification posterior to the knee is likely a loose body in a Baker's cyst. Electronically Signed   By: Inge Rise M.D.   On: 07/08/2017 13:42   Ct Head Wo Contrast  Result Date: 07/10/2017 CLINICAL DATA:  Patient fell out of wheelchair hitting her head. EXAM: CT HEAD WITHOUT CONTRAST CT CERVICAL SPINE WITHOUT CONTRAST TECHNIQUE: Multidetector CT imaging of the head and cervical spine was performed following the standard protocol without intravenous contrast. Multiplanar CT image reconstructions of the cervical spine were also generated. COMPARISON:  Head CT 01/22/2017 FINDINGS: CT HEAD FINDINGS Limited due to streak artifacts from the patient's shoulders and patient body habitus. Brain: No acute intracranial hemorrhage, edema, midline shift nor extra-axial fluid collections noted. No hydrocephalus. No large vascular territory infarct. Vascular: No hyperdense vessel sign. Skull: No skull fracture. Sinuses/Orbits: No acute finding. Other: None CT CERVICAL SPINE FINDINGS Limited by body habitus, motion and shoulder artifacts. Alignment: Mild straightening of cervical lordosis.  No listhesis. Skull base and vertebrae: No acute fracture identified. Soft tissues and spinal canal: Mild prominence of prevertebral soft tissues likely related to patient body habitus.  No visible canal hematoma. Disc levels: Multilevel degenerative disc disease with uncovertebral joint osteoarthritis C3 through T1. Multilevel degenerative facet arthropathy. Upper chest: No dominant mass Other: None IMPRESSION: Limited study due to patient body habitus and motion artifacts. No acute intracranial nor cervical spine abnormality. Cervical spondylosis. Electronically Signed   By: Ashley Royalty M.D.   On: 07/10/2017 20:14   Ct Cervical Spine Wo Contrast  Result Date: 07/10/2017 CLINICAL DATA:  Patient fell out of wheelchair hitting her head. EXAM: CT HEAD WITHOUT CONTRAST CT CERVICAL SPINE WITHOUT CONTRAST TECHNIQUE: Multidetector CT imaging of the head and cervical spine was performed following the standard protocol without intravenous contrast. Multiplanar CT image reconstructions of the cervical spine were also generated. COMPARISON:  Head CT 01/22/2017 FINDINGS: CT HEAD FINDINGS Limited due to  streak artifacts from the patient's shoulders and patient body habitus. Brain: No acute intracranial hemorrhage, edema, midline shift nor extra-axial fluid collections noted. No hydrocephalus. No large vascular territory infarct. Vascular: No hyperdense vessel sign. Skull: No skull fracture. Sinuses/Orbits: No acute finding. Other: None CT CERVICAL SPINE FINDINGS Limited by body habitus, motion and shoulder artifacts. Alignment: Mild straightening of cervical lordosis.  No listhesis. Skull base and vertebrae: No acute fracture identified. Soft tissues and spinal canal: Mild prominence of prevertebral soft tissues likely related to patient body habitus. No visible canal hematoma. Disc levels: Multilevel degenerative disc disease with uncovertebral joint osteoarthritis C3 through T1. Multilevel degenerative facet arthropathy. Upper chest: No dominant mass Other: None IMPRESSION: Limited study due to patient body habitus and motion artifacts. No acute intracranial nor cervical spine abnormality. Cervical  spondylosis. Electronically Signed   By: Ashley Royalty M.D.   On: 07/10/2017 20:14       Subjective: Patient is feeling better, continue to have fatigue and generalized weakness, no nausea or vomiting.   Discharge Exam: Vitals:   07/16/17 0616 07/16/17 0802  BP: 134/63   Pulse: 78   Resp: 16   Temp: 97.6 F (36.4 C)   SpO2: 100% 93%   Vitals:   07/16/17 0109 07/16/17 0616 07/16/17 0641 07/16/17 0802  BP:  134/63    Pulse: 74 78    Resp: 17 16    Temp:  97.6 F (36.4 C)    TempSrc:  Oral    SpO2: 94% 100%  93%  Weight:   (!) 179.1 kg (394 lb 14.4 oz)   Height:        General: Not in pain or dyspnea Neurology: Awake and alert, non focal  E ENT: no pallor, no icterus, oral mucosa moist Cardiovascular: No JVD. S1-S2 present, rhythmic, no gallops, rubs, or murmurs. No lower extremity edema. Pulmonary: vesicular breath sounds bilaterally, adequate air movement, no wheezing, rhonchi or rales. Gastrointestinal. Abdomen protuberant, with no organomegaly, non tender, no rebound or guarding Skin. No rashes Musculoskeletal: no joint deformities   The results of significant diagnostics from this hospitalization (including imaging, microbiology, ancillary and laboratory) are listed below for reference.     Microbiology: Recent Results (from the past 240 hour(s))  MRSA PCR Screening     Status: Abnormal   Collection Time: 07/07/17  5:36 PM  Result Value Ref Range Status   MRSA by PCR POSITIVE (A) NEGATIVE Final    Comment:        The GeneXpert MRSA Assay (FDA approved for NASAL specimens only), is one component of a comprehensive MRSA colonization surveillance program. It is not intended to diagnose MRSA infection nor to guide or monitor treatment for MRSA infections. RESULT CALLED TO, READ BACK BY AND VERIFIED WITH: SEAGRAVES,A. RN @1942  ON 04.16.19 BY COHEN,K Performed at [facility], [place] [address] [place], [place] [address]      Labs: BNP  (last 3 results) Recent Labs    05/08/17 2334 07/07/17 0630 07/10/17 2048  BNP 19.4 13.7 82.4   Basic Metabolic Panel: Recent Labs  Lab 07/10/17 0436  07/11/17 0535 07/12/17 0339 07/13/17 0502 07/14/17 0517 07/16/17 0424  NA 139   < > 142 143 144 143 140  K 5.5*   < > 4.6 4.8 4.5 4.1 3.7  CL 97*   < > 97* 95* 94* 93* 86*  CO2 32   < > 33* 35* 37* 38* 39*  GLUCOSE 114*   < > 94  113* 93 161* 92  BUN 44*   < > 39* 39* 37* 33* 35*  CREATININE 2.07*   < > 1.93* 1.79* 1.68* 1.89* 1.71*  CALCIUM 8.6*   < > 8.6* 8.4* 8.2* 8.3* 8.6*  MG 2.1  --   --   --   --   --   --    < > = values in this interval not displayed.   Liver Function Tests: Recent Labs  Lab 07/10/17 2046 07/11/17 0535  AST 21 21  ALT 14 14  ALKPHOS 63 57  BILITOT 0.6 0.5  PROT 7.4 6.7  ALBUMIN 3.8 3.4*   No results for input(s): LIPASE, AMYLASE in the last 168 hours. No results for input(s): AMMONIA in the last 168 hours. CBC: Recent Labs  Lab 07/10/17 2046 07/10/17 2056 07/11/17 0535 07/14/17 0517  WBC 9.0  --  8.0 8.1  NEUTROABS 6.7  --   --   --   HGB 11.7* 12.6 11.9* 11.4*  HCT 38.1 37.0 40.0 38.4  MCV 99.7  --  100.5* 100.3*  PLT 194  --  187 192   Cardiac Enzymes: Recent Labs  Lab 07/11/17 0535 07/11/17 1035  TROPONINI <0.03 <0.03   BNP: Invalid input(s): POCBNP CBG: No results for input(s): GLUCAP in the last 168 hours. D-Dimer No results for input(s): DDIMER in the last 72 hours. Hgb A1c No results for input(s): HGBA1C in the last 72 hours. Lipid Profile No results for input(s): CHOL, HDL, LDLCALC, TRIG, CHOLHDL, LDLDIRECT in the last 72 hours. Thyroid function studies No results for input(s): TSH, T4TOTAL, T3FREE, THYROIDAB in the last 72 hours.  Invalid input(s): FREET3 Anemia work up No results for input(s): VITAMINB12, FOLATE, FERRITIN, TIBC, IRON, RETICCTPCT in the last 72 hours. Urinalysis    Component Value Date/Time   COLORURINE STRAW (A) 07/07/2017 0902    APPEARANCEUR CLEAR 07/07/2017 0902   LABSPEC 1.012 07/07/2017 0902   PHURINE 6.0 07/07/2017 0902   GLUCOSEU NEGATIVE 07/07/2017 0902   GLUCOSEU NEG mg/dL 10/01/2007 2007   HGBUR NEGATIVE 07/07/2017 0902   HGBUR negative 12/05/2009 1003   BILIRUBINUR NEGATIVE 07/07/2017 0902   BILIRUBINUR small 07/31/2010 1713   KETONESUR NEGATIVE 07/07/2017 0902   PROTEINUR NEGATIVE 07/07/2017 0902   UROBILINOGEN 0.2 12/01/2014 2240   NITRITE NEGATIVE 07/07/2017 0902   LEUKOCYTESUR NEGATIVE 07/07/2017 0902   Sepsis Labs Invalid input(s): PROCALCITONIN,  WBC,  LACTICIDVEN Microbiology Recent Results (from the past 240 hour(s))  MRSA PCR Screening     Status: Abnormal   Collection Time: 07/07/17  5:36 PM  Result Value Ref Range Status   MRSA by PCR POSITIVE (A) NEGATIVE Final    Comment:        The GeneXpert MRSA Assay (FDA approved for NASAL specimens only), is one component of a comprehensive MRSA colonization surveillance program. It is not intended to diagnose MRSA infection nor to guide or monitor treatment for MRSA infections. RESULT CALLED TO, READ BACK BY AND VERIFIED WITH: SEAGRAVES,A. RN @1942  ON 04.16.19 BY COHEN,K Performed at [facility], [place] [address] [place], [place] [address]      Time coordinating discharge: 45 minutes  SIGNED:   Tawni Millers, MD  [facility] 07/16/2017, 9:46 AM Pager [phone]  If 7PM-7AM, please contact night-coverage [email or web address] Password [email or web address]

## 2017-07-16 NOTE — Progress Notes (Signed)
Per [facility] shower stool will be delivered to home. No further CM needs.

## 2017-07-16 NOTE — Progress Notes (Signed)
Patient asked RT to hand her her jacket upon RT handing pt her jacket RT noticed bottle of pills in the jacket and called the RN to the room. The patient states "the pills are for pain, and I haven't been taking them here." Explained to pt for safety they needed to be stored in the pharmacy. Pt was reluctant to agree but finally agreed. Pills counted and paper signed and medication taken to pharmacy.

## 2017-10-15 ENCOUNTER — Emergency Department (HOSPITAL_COMMUNITY): Payer: Medicaid Other

## 2017-10-15 ENCOUNTER — Encounter (HOSPITAL_COMMUNITY): Payer: Self-pay

## 2017-10-15 ENCOUNTER — Other Ambulatory Visit: Payer: Self-pay

## 2017-10-15 ENCOUNTER — Emergency Department (HOSPITAL_COMMUNITY)
Admission: EM | Admit: 2017-10-15 | Discharge: 2017-10-16 | Disposition: A | Discharge status: Home / Self Care | Payer: Medicaid Other | Attending: Emergency Medicine | Admitting: Emergency Medicine

## 2017-10-15 DIAGNOSIS — Z79899 Other long term (current) drug therapy: Secondary | ICD-10-CM | POA: Insufficient documentation

## 2017-10-15 DIAGNOSIS — I130 Hypertensive heart and chronic kidney disease with heart failure and stage 1 through stage 4 chronic kidney disease, or unspecified chronic kidney disease: Secondary | ICD-10-CM | POA: Insufficient documentation

## 2017-10-15 DIAGNOSIS — B372 Candidiasis of skin and nail: Secondary | ICD-10-CM

## 2017-10-15 DIAGNOSIS — I5032 Chronic diastolic (congestive) heart failure: Secondary | ICD-10-CM | POA: Diagnosis not present

## 2017-10-15 DIAGNOSIS — N183 Chronic kidney disease, stage 3 (moderate): Secondary | ICD-10-CM | POA: Diagnosis not present

## 2017-10-15 DIAGNOSIS — M79604 Pain in right leg: Secondary | ICD-10-CM | POA: Diagnosis not present

## 2017-10-15 DIAGNOSIS — Z87891 Personal history of nicotine dependence: Secondary | ICD-10-CM | POA: Insufficient documentation

## 2017-10-15 DIAGNOSIS — R062 Wheezing: Secondary | ICD-10-CM | POA: Diagnosis not present

## 2017-10-15 DIAGNOSIS — R0609 Other forms of dyspnea: Secondary | ICD-10-CM | POA: Insufficient documentation

## 2017-10-15 DIAGNOSIS — R0602 Shortness of breath: Secondary | ICD-10-CM | POA: Diagnosis present

## 2017-10-15 LAB — CBC WITH DIFFERENTIAL/PLATELET
Basophils Absolute: 0 10*3/uL (ref 0.0–0.1)
Basophils Relative: 0 %
EOS PCT: 3 %
Eosinophils Absolute: 0.3 10*3/uL (ref 0.0–0.7)
HCT: 38.4 % (ref 36.0–46.0)
Hemoglobin: 11.3 g/dL — ABNORMAL LOW (ref 12.0–15.0)
LYMPHS PCT: 26 %
Lymphs Abs: 2.1 10*3/uL (ref 0.7–4.0)
MCH: 29.7 pg (ref 26.0–34.0)
MCHC: 29.4 g/dL — AB (ref 30.0–36.0)
MCV: 100.8 fL — AB (ref 78.0–100.0)
Monocytes Absolute: 0.4 10*3/uL (ref 0.1–1.0)
Monocytes Relative: 5 %
Neutro Abs: 5.4 10*3/uL (ref 1.7–7.7)
Neutrophils Relative %: 66 %
PLATELETS: 250 10*3/uL (ref 150–400)
RBC: 3.81 MIL/uL — AB (ref 3.87–5.11)
RDW: 14.6 % (ref 11.5–15.5)
WBC: 8.2 10*3/uL (ref 4.0–10.5)

## 2017-10-15 MED ORDER — IPRATROPIUM BROMIDE 0.02 % IN SOLN
0.5000 mg | Freq: Once | RESPIRATORY_TRACT | Status: AC
Start: 2017-10-15 — End: 2017-10-15
  Administered 2017-10-15: 0.5 mg via RESPIRATORY_TRACT
  Filled 2017-10-15: qty 2.5

## 2017-10-15 MED ORDER — ALBUTEROL SULFATE (2.5 MG/3ML) 0.083% IN NEBU
5.0000 mg | INHALATION_SOLUTION | Freq: Once | RESPIRATORY_TRACT | Status: AC
  Administered 2017-10-15: 5 mg via RESPIRATORY_TRACT
  Filled 2017-10-15: qty 6

## 2017-10-15 NOTE — ED Provider Notes (Signed)
Kara Vincent DEPT Provider Note   CSN: [id number] Arrival date & time: 10/15/17  2051     History   Chief Complaint Chief Complaint  Patient presents with  . generalized body pain  . vaginal boil    HPI Kara Vincent is a 52 y.o. female.  Patient presents with multiple complaints including SOB with wheezing; generalized body aches; painful swelling to anterior right LE w/o injury; recurrent abscess to groin; uncontrolled yeast rash. She reports having a fever but also states her Tmax is 98.9. No vomiting. She reports being compliant with medications but out of some of them, namely her Xanax, her pain medications and her Vesicare. She was discharged from PCP and has not established with another doctor yet. She voices wanting to be admitted to the hospital.   The history is provided by the patient. No language interpreter was used.    Past Medical History:  Diagnosis Date  . Allergy   . Anxiety   . Arthritis   . Asthma   . Asthma   . CHF (congestive heart failure) ([place])   . CHF (congestive heart failure) ([place])   . Chronic abdominal pain   . Chronic kidney disease   . Chronic pain    "all over"  . 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 GERD (gastroesophageal reflux disease)   . Gout   . Hidradenitis   . HLD (hyperlipidemia) 12/14/2015  . Homelessness   . Hyperlipemia   . Hypertension   . Low back pain   . Morbid obesity ([place])    uses motor wheel chair  . MRSA (methicillin resistant Staphylococcus aureus)    states about a year ago  . On home O2    3L N/C O2 continuously  . OSA (obstructive sleep apnea)    non-compliant with CPAP  . Oxygen deficiency   . Renal insufficiency   . Sleep apnea     Patient Active Problem List   Diagnosis Date Noted  . Syncope and collapse 07/14/2017  . Hyperkalemia 07/10/2017  . Swelling 07/07/2017  . Hypoxia 01/22/2017  . Acute pulmonary edema ([place]) 12/14/2015  . Essential  hypertension 12/14/2015  . HLD (hyperlipidemia) 12/14/2015  . GERD (gastroesophageal reflux disease) 12/14/2015  . Gout 12/14/2015  . Acute on chronic diastolic (congestive) heart failure ([place]) 12/14/2015  . Chest pain 12/14/2015  . Shortness of breath 06/09/2015  . Atypical chest pain   . Darier's disease   . Leukocytosis   . 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 Acute bronchitis 11/20/2014  . Right-sided heart failure ([place]) 11/20/2014  . OSA (obstructive sleep apnea)   . CKD (chronic kidney disease) stage 3, GFR 30-59 ml/min ([facility]) 11/17/2014  . Asthma exacerbation 11/16/2014  . Venous stasis 11/16/2014  . Acute on chronic respiratory failure with hypoxia and hypercapnia ([place]) 11/16/2014  . Acute respiratory failure ([place]) 09/02/2013  . Hypotension 08/08/2012  . Chronic pain 08/24/2011  . Obesity hypoventilation syndrome ([place]) 08/24/2011    Past Surgical History:  Procedure Laterality Date  . BACK SURGERY    . CESAREAN SECTION    . CESAREAN SECTION    . FRACTURE SURGERY    . TUBAL LIGATION       OB History    Gravida  0   Para  0   Term  0   Preterm  0   AB  0   Living        SAB  0  TAB  0   Ectopic  0   Multiple      Live Births               Home Medications    Prior to Admission medications   Medication Sig Start Date End Date Taking? Authorizing Provider  albuterol (PROVENTIL HFA;VENTOLIN HFA) 108 (90 Base) MCG/ACT inhaler Inhale 1-2 puffs into the lungs every 6 (six) hours as needed for wheezing or shortness of breath. 07/16/17 10/15/17 Yes Arrien, Jimmy Picket, MD  allopurinol (ZYLOPRIM) 100 MG tablet Take 1 tablet (100 mg total) by mouth every morning. 07/16/17 10/15/17 Yes Arrien, Jimmy Picket, MD  ALPRAZolam Duanne Moron) 1 MG tablet Take 1 mg by mouth 3 (three) times daily as needed for anxiety (anxiety).    Yes [provider]  ammonium lactate (AMLACTIN) 12 % cream Apply 1 g topically daily as needed for dry skin. 07/16/17  Yes Arrien,  Jimmy Picket, MD  atorvastatin (LIPITOR) 20 MG tablet Take 1 tablet (20 mg total) by mouth every morning. 07/16/17 10/15/17 Yes Arrien, Jimmy Picket, MD  benzonatate (TESSALON) 100 MG capsule Take 1 capsule (100 mg total) by mouth 3 (three) times daily as needed for cough. 07/16/17  Yes Arrien, Jimmy Picket, MD  budesonide-formoterol [facility]) 160-4.5 MCG/ACT inhaler Inhale 2 puffs into the lungs 2 (two) times daily. 07/16/17  Yes Arrien, Jimmy Picket, MD  butalbital-acetaminophen-caffeine (FIORICET, ESGIC) [phone] MG tablet Take 1 tablet by mouth every 6 (six) hours as needed for headache. 07/16/17  Yes Arrien, Jimmy Picket, MD  Emollient (EUCERIN) lotion Apply 5 mLs topically 2 (two) times daily as needed for dry skin. 07/10/17  Yes Patrecia Pour, Christean Grief, MD  furosemide (LASIX) 40 MG tablet Take 1 tablet (40 mg total) by mouth 2 (two) times daily. 07/16/17 10/15/17 Yes Arrien, Jimmy Picket, MD  gabapentin (NEURONTIN) 100 MG capsule Take 1 capsule (100 mg total) by mouth 2 (two) times daily. 07/16/17  Yes Arrien, Jimmy Picket, MD  hydrOXYzine (ATARAX/VISTARIL) 25 MG tablet Take 25 mg by mouth 3 (three) times daily as needed for anxiety or itching (anxiety and itching).    Yes [provider]  ipratropium-albuterol (DUONEB) 0.5-2.5 (3) MG/3ML SOLN Take 3 mLs by nebulization every 6 (six) hours as needed (SOB). 07/16/17 10/15/17 Yes Arrien, Jimmy Picket, MD  isosorbide mononitrate (IMDUR) 30 MG 24 hr tablet Take 1 tablet (30 mg total) by mouth daily. 07/16/17 10/15/17 Yes Arrien, Jimmy Picket, MD  ketoconazole (NIZORAL) 2 % shampoo Apply 1 application topically 2 (two) times a week. 07/16/17  Yes Arrien, Jimmy Picket, MD  lisinopril (PRINIVIL,ZESTRIL) 40 MG tablet Take 40 mg by mouth daily. 09/17/17  Yes [provider]  nystatin cream (MYCOSTATIN) Apply to affected area 2 times daily 07/16/17  Yes Arrien, Jimmy Picket, MD  pantoprazole (PROTONIX) 40 MG tablet Take  40 mg by mouth daily. 09/17/17  Yes [provider]  solifenacin (VESICARE) 5 MG tablet Take 1 tablet (5 mg total) by mouth every morning. 07/08/17  Yes Doreatha Lew, MD  Vitamin D, Ergocalciferol, (DRISDOL) 50000 units CAPS capsule Take 1 capsule (50,000 Units total) by mouth once a week. 07/16/17  Yes Arrien, Jimmy Picket, MD  HYDROcodone-acetaminophen [facility]) 10-325 MG tablet Take 1 tablet by mouth every 4 (four) hours as needed for moderate pain. 12/24/15   Hosie Poisson, MD  diphenhydrAMINE (SOMINEX) 25 MG tablet Take 25 mg by mouth at bedtime as needed.  06/14/11  [provider]    Family History Family  History  Problem Relation Age of Onset  . Asthma Mother   . Diabetes Father   . Stroke Father   . Heart disease Father   . Arthritis Sister   . Asthma Sister   . Asthma Daughter   . Asthma Son   . Arthritis Sister   . Asthma Sister     Social History Social History   Tobacco Use  . Smoking status: Former Smoker    Types: Cigarettes    Last attempt to quit: 03/25/2003    Years since quitting: 14.5  . Smokeless tobacco: Former Systems developer    Quit date: 05/27/1978  Substance Use Topics  . Alcohol use: Yes    Alcohol/week: 0.0 oz    Comment: years ago  no longer beer only  early 20's  . Drug use: Yes    Types: "Crack" cocaine, Other-see comments    Comment: years  ago     Allergies   Ibuprofen; Penicillins; Sulfa antibiotics; Adhesive [tape]; Doxycycline; Sulfamethoxazole-trimethoprim; and Zithromax [azithromycin]   Review of Systems Review of Systems  Constitutional: Negative for chills and fever.  HENT: Negative.   Respiratory: Positive for chest tightness, shortness of breath and wheezing.   Cardiovascular: Negative.   Gastrointestinal: Negative.  Negative for diarrhea and vomiting.  Musculoskeletal: Positive for myalgias.  Skin: Positive for rash.       C/o recurrent abscess  Neurological: Negative.      Physical Exam Updated Vital  Signs BP 119/69 (BP Location: Right Arm)   Pulse 72   Temp 98.1 F (36.7 C) (Oral)   Resp 18   LMP 03/24/2009   SpO2 90%   Physical Exam  Constitutional: She is oriented to person, place, and time. She appears well-developed and well-nourished. No distress.  HENT:  Head: Normocephalic.  Mouth/Throat: Oropharynx is clear and moist.  Neck: Normal range of motion. Neck supple.  Cardiovascular: Normal rate, regular rhythm and intact distal pulses.  Pulmonary/Chest: Effort normal. She has wheezes.  Inspiratory and expiratory wheezing bilaterally. Exam limited by body habitus.  Abdominal: Soft. Bowel sounds are normal. There is no tenderness. There is no rebound and no guarding.  Exam limited by body habitus  Musculoskeletal: Normal range of motion.  Anterior right proximal LE swelling that is focally tender. No redness, wound, warmth. No calf pain.   Neurological: She is alert and oriented to person, place, and time.  Skin: Skin is warm and dry. No rash noted.  Widespread, plaque like, erythematous rash c/w candidal rash, predominantly under breasts and throughout groin. There is a small area of tenderness without significant induration, fluctuance to left inguinal fold. Doubt abscess.   Psychiatric: She has a normal mood and affect.  Nursing note and vitals reviewed.    ED Treatments / Results  Labs (all labs ordered are listed, but only abnormal results are displayed) Labs Reviewed  CBC WITH DIFFERENTIAL/PLATELET  COMPREHENSIVE METABOLIC PANEL    EKG None  Radiology No results found.  Procedures Procedures (including critical care time)  Medications Ordered in ED Medications  albuterol (PROVENTIL) (2.5 MG/3ML) 0.083% nebulizer solution 5 mg (has no administration in time range)  ipratropium (ATROVENT) nebulizer solution 0.5 mg (has no administration in time range)     Initial Impression / Assessment and Plan / ED Course  I have reviewed the triage vital signs and  the nursing notes.  Pertinent labs & imaging results that were available during my care of the patient were reviewed by me and considered in my medical  decision making (see chart for details).     Patient with medical history including morbid obesity, CHF, HTN, HLD, chronic abd pain, hidradenitis. She is here with multiple complaints wishing admission to the hospital.   She reports not having her nebulizer machine at home, "its lost" and is wheezing now. She sats 90% O2 saturation on room air. She is on chronic 3L at home but left her home without her oxygen. Suspect this is her baseline. History of CHF as well. Will obtain chest x-ray to insure there is no infiltration or edema.   She has uncontrolled cutaneous yeast, chronic. Will refill her lotrimin.   No groin abscess identified that will require I&D.   Basic labs pending for full evaluation with multiple medical problems. Anticipate she will be stable for discharge home. Discussed that we will not fill chronic controlled substance prescriptions in the ED. Will provide referrals for primary care to get re-established.  Patient's CXR showing edema and congestion, progressive since last comparable (06/2017). She continues to feel SOB despite Albuterol treatment. BNP pending. Will give dose Lasix, 80 mg, IV.  CXR shows mild congestion/edema. BNP 35. She has received IV lasix here which should help.   Evaluation of the painful right leg reveals no evidence infection, no suspicion for clot or injury. Recommend Tylenol.   Will increase Lasix from 40 mg to 80 mg morning dose, continue 40 mg pm dose.   Will provide referrals for new PCP and for cardiology care.   Final Clinical Impressions(s) / ED Diagnoses   Final diagnoses:  None   1. Mild cHF 2. Wheezing, resolved 3. Right LE pain 4. Candidal rash, generalized 5. Morbid obesity   ED Discharge Orders    None       Dennie Bible 10/16/17 0207    Daleen Bo,  MD 10/17/17 [phone]

## 2017-10-15 NOTE — ED Triage Notes (Signed)
Pt is alert and oriented x 4 and is verbally responsive. Pt has multiple complaints. Pt reports generalized body aches and swelling, Pt reports having a boil in genital region causing her pain  and that she has yeast increased yeast in vaginal area. Pt reports that she has not taken anything for pain and has been out of pain medications.

## 2017-10-15 NOTE — ED Notes (Signed)
Pt called for triage, no response from lobby

## 2017-10-16 LAB — COMPREHENSIVE METABOLIC PANEL
ALBUMIN: 3.5 g/dL (ref 3.5–5.0)
ALT: 9 U/L (ref 0–44)
AST: 12 U/L — AB (ref 15–41)
Alkaline Phosphatase: 51 U/L (ref 38–126)
Anion gap: 6 (ref 5–15)
BUN: 22 mg/dL — AB (ref 6–20)
CHLORIDE: 101 mmol/L (ref 98–111)
CO2: 36 mmol/L — AB (ref 22–32)
CREATININE: 1.55 mg/dL — AB (ref 0.44–1.00)
Calcium: 8.5 mg/dL — ABNORMAL LOW (ref 8.9–10.3)
GFR calc non Af Amer: 37 mL/min — ABNORMAL LOW (ref >60)
GFR, EST AFRICAN AMERICAN: 43 mL/min — AB (ref >60)
GLUCOSE: 123 mg/dL — AB (ref 70–99)
Potassium: 4.3 mmol/L (ref 3.5–5.1)
SODIUM: 143 mmol/L (ref 135–145)
Total Bilirubin: 0.6 mg/dL (ref 0.3–1.2)
Total Protein: 6.8 g/dL (ref 6.5–8.1)

## 2017-10-16 LAB — BRAIN NATRIURETIC PEPTIDE: B Natriuretic Peptide: 35.4 pg/mL (ref 0.0–100.0)

## 2017-10-16 LAB — TROPONIN I

## 2017-10-16 MED ORDER — NYSTATIN 100000 UNIT/GM EX CREA: TOPICAL_CREAM | CUTANEOUS | 1 refills | Status: DC

## 2017-10-16 MED ORDER — NYSTATIN 100000 UNIT/GM EX CREA: TOPICAL_CREAM | CUTANEOUS | 0 refills | Status: DC

## 2017-10-16 MED ORDER — FUROSEMIDE 10 MG/ML IJ SOLN
80.0000 mg | Freq: Once | INTRAMUSCULAR | Status: AC
  Administered 2017-10-16: 80 mg via INTRAVENOUS
  Filled 2017-10-16: qty 8

## 2017-10-16 NOTE — Discharge Instructions (Addendum)
Increase your Lasix from 40 mg in the morning to 80 mg for the next 4 days. You can continue your night time dose at 40 mg. This should help get your breathing better. Use Albuterol for wheezing. Take Tylenol for pain. Your chronic controlled substance medications, ie Xanax and pain medicine, will have to be prescribed by a primary care doctor. REferrals have been provided. Resume use of your cream (prescription provided) for the yeast infection.

## 2017-10-16 NOTE — ED Notes (Signed)
Patient refuses a diet soda and is requesting a regular soda.

## 2017-11-20 ENCOUNTER — Ambulatory Visit (HOSPITAL_COMMUNITY)
Admission: EM | Admit: 2017-11-20 | Discharge: 2017-11-20 | Discharge status: Absent without leave | Payer: Medicaid Other

## 2017-12-01 ENCOUNTER — Ambulatory Visit: Payer: Medicaid Other | Attending: Internal Medicine | Admitting: Internal Medicine

## 2017-12-01 ENCOUNTER — Ambulatory Visit (HOSPITAL_BASED_OUTPATIENT_CLINIC_OR_DEPARTMENT_OTHER): Payer: Medicaid Other | Admitting: Licensed Clinical Social Worker

## 2017-12-01 ENCOUNTER — Encounter: Payer: Self-pay | Admitting: Internal Medicine

## 2017-12-01 VITALS — BP 185/70 | HR 76 | Temp 98.8°F | Resp 16 | Ht 65.0 in | Wt 386.0 lb

## 2017-12-01 DIAGNOSIS — Z882 Allergy status to sulfonamides status: Secondary | ICD-10-CM | POA: Diagnosis not present

## 2017-12-01 DIAGNOSIS — Z6841 Body Mass Index (BMI) 40.0 and over, adult: Secondary | ICD-10-CM | POA: Insufficient documentation

## 2017-12-01 DIAGNOSIS — N3941 Urge incontinence: Secondary | ICD-10-CM | POA: Insufficient documentation

## 2017-12-01 DIAGNOSIS — M109 Gout, unspecified: Secondary | ICD-10-CM | POA: Diagnosis not present

## 2017-12-01 DIAGNOSIS — G894 Chronic pain syndrome: Secondary | ICD-10-CM | POA: Insufficient documentation

## 2017-12-01 DIAGNOSIS — Z880 Allergy status to penicillin: Secondary | ICD-10-CM | POA: Insufficient documentation

## 2017-12-01 DIAGNOSIS — Z7409 Other reduced mobility: Secondary | ICD-10-CM

## 2017-12-01 DIAGNOSIS — Z7951 Long term (current) use of inhaled steroids: Secondary | ICD-10-CM | POA: Insufficient documentation

## 2017-12-01 DIAGNOSIS — L309 Dermatitis, unspecified: Secondary | ICD-10-CM

## 2017-12-01 DIAGNOSIS — N183 Chronic kidney disease, stage 3 (moderate): Secondary | ICD-10-CM | POA: Diagnosis not present

## 2017-12-01 DIAGNOSIS — G4733 Obstructive sleep apnea (adult) (pediatric): Secondary | ICD-10-CM

## 2017-12-01 DIAGNOSIS — F419 Anxiety disorder, unspecified: Secondary | ICD-10-CM

## 2017-12-01 DIAGNOSIS — L304 Erythema intertrigo: Secondary | ICD-10-CM | POA: Diagnosis not present

## 2017-12-01 DIAGNOSIS — K219 Gastro-esophageal reflux disease without esophagitis: Secondary | ICD-10-CM | POA: Insufficient documentation

## 2017-12-01 DIAGNOSIS — B373 Candidiasis of vulva and vagina: Secondary | ICD-10-CM | POA: Insufficient documentation

## 2017-12-01 DIAGNOSIS — R51 Headache: Secondary | ICD-10-CM | POA: Diagnosis not present

## 2017-12-01 DIAGNOSIS — Z888 Allergy status to other drugs, medicaments and biological substances status: Secondary | ICD-10-CM | POA: Diagnosis not present

## 2017-12-01 DIAGNOSIS — M170 Bilateral primary osteoarthritis of knee: Secondary | ICD-10-CM

## 2017-12-01 DIAGNOSIS — I5032 Chronic diastolic (congestive) heart failure: Secondary | ICD-10-CM | POA: Diagnosis not present

## 2017-12-01 DIAGNOSIS — I5082 Biventricular heart failure: Secondary | ICD-10-CM | POA: Diagnosis not present

## 2017-12-01 DIAGNOSIS — N760 Acute vaginitis: Secondary | ICD-10-CM | POA: Diagnosis present

## 2017-12-01 DIAGNOSIS — Z881 Allergy status to other antibiotic agents status: Secondary | ICD-10-CM | POA: Insufficient documentation

## 2017-12-01 DIAGNOSIS — Z87891 Personal history of nicotine dependence: Secondary | ICD-10-CM | POA: Insufficient documentation

## 2017-12-01 DIAGNOSIS — Z79899 Other long term (current) drug therapy: Secondary | ICD-10-CM | POA: Insufficient documentation

## 2017-12-01 DIAGNOSIS — B3731 Acute candidiasis of vulva and vagina: Secondary | ICD-10-CM

## 2017-12-01 DIAGNOSIS — E785 Hyperlipidemia, unspecified: Secondary | ICD-10-CM | POA: Diagnosis not present

## 2017-12-01 DIAGNOSIS — Z886 Allergy status to analgesic agent status: Secondary | ICD-10-CM | POA: Diagnosis not present

## 2017-12-01 DIAGNOSIS — I130 Hypertensive heart and chronic kidney disease with heart failure and stage 1 through stage 4 chronic kidney disease, or unspecified chronic kidney disease: Secondary | ICD-10-CM | POA: Insufficient documentation

## 2017-12-01 DIAGNOSIS — B9689 Other specified bacterial agents as the cause of diseases classified elsewhere: Secondary | ICD-10-CM | POA: Diagnosis not present

## 2017-12-01 DIAGNOSIS — I10 Essential (primary) hypertension: Secondary | ICD-10-CM

## 2017-12-01 DIAGNOSIS — Z8249 Family history of ischemic heart disease and other diseases of the circulatory system: Secondary | ICD-10-CM | POA: Insufficient documentation

## 2017-12-01 MED ORDER — FLUCONAZOLE 150 MG PO TABS: ORAL_TABLET | ORAL | 0 refills | Status: DC

## 2017-12-01 MED ORDER — NYSTATIN 100000 UNIT/GM EX CREA: TOPICAL_CREAM | CUTANEOUS | 1 refills | Status: DC

## 2017-12-01 MED ORDER — CLONIDINE HCL 0.2 MG PO TABS: 0.2000 mg | ORAL_TABLET | Freq: Two times a day (BID) | ORAL | 6 refills | Status: DC

## 2017-12-01 MED ORDER — SOLIFENACIN SUCCINATE 5 MG PO TABS: 5.0000 mg | ORAL_TABLET | Freq: Every morning | ORAL | 6 refills | Status: DC

## 2017-12-01 MED ORDER — ATORVASTATIN CALCIUM 20 MG PO TABS: 20.0000 mg | ORAL_TABLET | Freq: Every morning | ORAL | 4 refills | Status: DC

## 2017-12-01 MED ORDER — TRIAMCINOLONE ACETONIDE 0.1 % EX CREA: 1.0000 "application " | TOPICAL_CREAM | Freq: Two times a day (BID) | CUTANEOUS | 1 refills | Status: DC

## 2017-12-01 MED ORDER — FUROSEMIDE 40 MG PO TABS: 40.0000 mg | ORAL_TABLET | Freq: Every day | ORAL | 3 refills | Status: DC

## 2017-12-01 MED ORDER — SERTRALINE HCL 50 MG PO TABS
25.0000 mg | ORAL_TABLET | Freq: Every day | ORAL | 3 refills | Status: DC
Start: 2017-12-01 — End: 2018-01-05

## 2017-12-01 MED ORDER — NYSTATIN 100000 UNIT/GM EX CREA: TOPICAL_CREAM | CUTANEOUS | 0 refills | Status: DC

## 2017-12-01 NOTE — Progress Notes (Signed)
Pt states she has knots in the back of her neck that goes down  Pt states she has had a yeast infection under stomach and vaginal area for a year. She has tried monistat nut it has not worked.  Pt is needing medication refill for vicodin and xanax

## 2017-12-01 NOTE — BH Specialist Note (Signed)
Integrated Behavioral Health Initial Visit  MRN: [id number] Name: Kara Vincent  Number of [place] Clinician visits:: 1/6 Session Start time: 3:10 PM  Session End time: 3:40 PM Total time: 30 minutes  Type of Service: [place] Interpretor:No. Interpretor Name and Language: N/A   Warm Hand Off Completed.       SUBJECTIVE: Kara Vincent is a 52 y.o. female accompanied by self Patient was referred by Dr. Wynetta Emery for anxiety. Patient reports the following symptoms/concerns: feelings of sadness, low energy, restlessness, and irritability Duration of problem: Ongoing; Severity of problem: mild  OBJECTIVE: Mood: Anxious and Affect: Appropriate Risk of harm to self or others: No plan to harm self or others  LIFE CONTEXT: Family and Social: Pt has a friend who visits her home 3-4 x weekly to assist with cooking and cleaning. Her family resides in [place]: Pt receives SSI [id number]) and food stamps [phone]) Self-Care: Pt participates in medication management  Life Changes: Pt has ongoing medical conditions that limits pt's mobility. Currently her electric wheelchair needs repairs  GOALS ADDRESSED: Patient will: 1. Reduce symptoms of: anxiety and stress 2. Increase knowledge and/or ability of: coping skills  3. Demonstrate ability to: Increase healthy adjustment to current life circumstances and Increase adequate support systems for patient/family  INTERVENTIONS: Interventions utilized: Solution-Focused Strategies, Supportive Counseling, Psychoeducation and/or Health Education and Link to [employer]  Standardized Assessments completed: GAD-7 and PHQ 2&9  ASSESSMENT: Patient currently experiencing depression and anxiety triggered by ongoing medical conditions that limits pt's mobility. Currently her electric wheelchair needs repairs. She reports feelings of sadness, low energy, restlessness, and  irritability.   Patient may benefit from psychoeducation and psychotherapy. She participates in medication management through PCP and receives strong support from a friend. Pt is not interested in [place] to assist her with daily activities. LCSWA discussed healthy coping skills that could assist in management of stressors. Pt was provided additional resources for food insecurity.   PLAN: 1. Follow up with behavioral health clinician on : Pt was encouraged to contact LCSWA if symptoms worsen or fail to improve to schedule behavioral appointments at [facility]. 2. Behavioral recommendations: LCSWA recommends that pt apply healthy coping skills discussed and utilize provided resources. Pt is encouraged to schedule follow up appointment with LCSWA 3. Referral(s): Standard (In Clinic) and [employer] Resources:  Food 4. "From scale of 1-10, how likely are you to follow plan?":   Rebekah [place], LCSW 12/03/17 5:21 PM

## 2017-12-01 NOTE — Progress Notes (Signed)
Patient ID: Kara Vincent, female    DOB: [birth year]  MRN: [id number]  CC: New Patient (Initial Visit); Vaginitis; and Headache   Subjective: Kara Vincent is a 52 y.o. female who presents for new pt visit.   Her concerns today include:  Patient with history of asthma, obstructive sleep apnea, HTN, diastolic heart failure, aortic stenosis and CKD stage III, morbid obesity, chronic pain syndrome, gout, urge incontinence  Previous PCP was Dr. Marlou Sa.  Last seen several mths. Pt states he dismissed her from his practice because she told him that she was going to find herself another doctor.   Lives a lone.  Has a friend who comes by 3-4 days a wk to clean, does grocery shopping, cooks.  Has a cane and walker.  Has a mobility chair but needs to be fixed.  Needs a motor, battery and other parts.  She has a medical necessity form with her today that she would like for me to sign off on to allow her to get the parts for her mobility chair.  Has problems with mobility due to arthritis in her knees, obesity and shows overall poor health.   Has 2 grown children who live in [place].  They keep intouch via phone  C/o vaginal yeast infection and underneath the abdominal fold in the upper thigh.  Uses nystatin cream on the abdominal fold and upper thigh which helps a little.  She also has a chronic rash on both lower legs and a itchy rash over the anterior chest which she has had for several weeks.  Reports that she did have bedbugs in the past but not anymore.   HTN:  On Lisinopril 40 mg, Isosorbide, Clonidine and Furosemide.  Out of Clonidine and Fursemide.  Out of Furosemide for several days.  Bottle says 40 mg twice a day but patient states that she was taking it several times a week only because they had cut back on the dosing due to her kidney function.  Reports being on clonidine 0.2 mg twice a day.  She denies any chest pains or lower extremity edema.  She tries to limit salt in the  foods.  GERD: needs RF on Pantoprazole  OSA:  Loss CPAP when she had to move out of her apartment several yrs ago.   Endorses snoring and daytime sleepiness.   Requesting refill on Hydrocodone which she states she was on from a previous doctor for headaches and knee pain.  She has been out of this for a few months.  She is also requesting a prescription for Xanax which she states she was on for anxiety but has been out of it for several months.  Patient Active Problem List   Diagnosis Date Noted  . Syncope and collapse 07/14/2017  . Hyperkalemia 07/10/2017  . Swelling 07/07/2017  . Hypoxia 01/22/2017  . Acute pulmonary edema ([place]) 12/14/2015  . Essential hypertension 12/14/2015  . HLD (hyperlipidemia) 12/14/2015  . GERD (gastroesophageal reflux disease) 12/14/2015  . Gout 12/14/2015  . Acute on chronic diastolic (congestive) heart failure ([place]) 12/14/2015  . Chest pain 12/14/2015  . Shortness of breath 06/09/2015  . Atypical chest pain   . Darier's disease   . Leukocytosis   . 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 Acute bronchitis 11/20/2014  . Right-sided heart failure ([place]) 11/20/2014  . OSA (obstructive sleep apnea)   . CKD (chronic kidney disease) stage 3, GFR 30-59 ml/min ([facility]) 11/17/2014  . Asthma exacerbation 11/16/2014  . Venous stasis  11/16/2014  . Acute on chronic respiratory failure with hypoxia and hypercapnia ([place]) 11/16/2014  . Acute respiratory failure ([place]) 09/02/2013  . Hypotension 08/08/2012  . Chronic pain 08/24/2011  . Obesity hypoventilation syndrome ([place]) 08/24/2011     Current Outpatient Medications on File Prior to Visit  Medication Sig Dispense Refill  . albuterol (PROVENTIL HFA;VENTOLIN HFA) 108 (90 Base) MCG/ACT inhaler Inhale 1-2 puffs into the lungs every 6 (six) hours as needed for wheezing or shortness of breath. 1 Inhaler 0  . allopurinol (ZYLOPRIM) 100 MG tablet Take 1 tablet (100 mg total) by mouth every morning. 30 tablet 0  . ALPRAZolam (XANAX) 1  MG tablet Take 1 mg by mouth 3 (three) times daily as needed for anxiety (anxiety).     [name] ammonium lactate (AMLACTIN) 12 % cream Apply 1 g topically daily as needed for dry skin. (Patient not taking: Reported on 12/01/2017) 385 g 0  . atorvastatin (LIPITOR) 20 MG tablet Take 1 tablet (20 mg total) by mouth every morning. 30 tablet 0  . benzonatate (TESSALON) 100 MG capsule Take 1 capsule (100 mg total) by mouth 3 (three) times daily as needed for cough. (Patient not taking: Reported on 12/01/2017) 20 capsule 0  . budesonide-formoterol (SYMBICORT) 160-4.5 MCG/ACT inhaler Inhale 2 puffs into the lungs 2 (two) times daily. (Patient not taking: Reported on 12/01/2017) 1 Inhaler 0  . butalbital-acetaminophen-caffeine (FIORICET, ESGIC) 50-325-40 MG tablet Take 1 tablet by mouth every 6 (six) hours as needed for headache. (Patient not taking: Reported on 12/01/2017) 14 tablet 0  . Emollient (EUCERIN) lotion Apply 5 mLs topically 2 (two) times daily as needed for dry skin. (Patient not taking: Reported on 12/01/2017) 240 mL 0  . furosemide (LASIX) 40 MG tablet Take 1 tablet (40 mg total) by mouth 2 (two) times daily. 60 tablet 0  . gabapentin (NEURONTIN) 100 MG capsule Take 1 capsule (100 mg total) by mouth 2 (two) times daily. (Patient not taking: Reported on 12/01/2017) 20 capsule 0  . HYDROcodone-acetaminophen (NORCO) 10-325 MG tablet Take 1 tablet by mouth every 4 (four) hours as needed for moderate pain. (Patient not taking: Reported on 12/01/2017) 15 tablet 0  . hydrOXYzine (ATARAX/VISTARIL) 25 MG tablet Take 25 mg by mouth 3 (three) times daily as needed for anxiety or itching (anxiety and itching).     [name] ipratropium-albuterol (DUONEB) 0.5-2.5 (3) MG/3ML SOLN Take 3 mLs by nebulization every 6 (six) hours as needed (SOB). 3 mL 0  . isosorbide mononitrate (IMDUR) 30 MG 24 hr tablet Take 1 tablet (30 mg total) by mouth daily. 30 tablet 0  . ketoconazole (NIZORAL) 2 % shampoo Apply 1 application topically 2 (two)  times a week. (Patient not taking: Reported on 12/01/2017) 120 mL 0  . lisinopril (PRINIVIL,ZESTRIL) 40 MG tablet Take 40 mg by mouth daily.  3  . nystatin cream (MYCOSTATIN) Apply to affected area 2 times daily (Patient not taking: Reported on 12/01/2017) 60 g 0  . nystatin cream (MYCOSTATIN) Apply to affected area 2 times daily 15 g 1  . pantoprazole (PROTONIX) 40 MG tablet Take 40 mg by mouth daily.  3  . solifenacin (VESICARE) 5 MG tablet Take 1 tablet (5 mg total) by mouth every morning. (Patient not taking: Reported on 12/01/2017) 30 tablet 0  . Vitamin D, Ergocalciferol, (DRISDOL) 50000 units CAPS capsule Take 1 capsule (50,000 Units total) by mouth once a week. (Patient not taking: Reported on 12/01/2017) 2 capsule 0  . [DISCONTINUED] diphenhydrAMINE ([place]) 25  MG tablet Take 25 mg by mouth at bedtime as needed.     No current facility-administered medications on file prior to visit.     Allergies  Allergen Reactions  . Ibuprofen Hives, Shortness Of Breath, Palpitations and Rash    Has tolerated toradol 12/2013 inpatient  . Penicillins Anaphylaxis, Hives and Rash    Has patient had a PCN reaction causing immediate rash, facial/tongue/throat swelling, SOB or lightheadedness with hypotension: Yes Has patient had a PCN reaction causing severe rash involving mucus membranes or skin necrosis: Yes Has patient had a PCN reaction that required hospitalization Unknown Has patient had a PCN reaction occurring within the last 10 years: Unknown If all of the above answers are "NO", then may proceed with Cephalosporin use.   . Sulfa Antibiotics Hives, Shortness Of Breath, Rash and Cough  . Adhesive [Tape] Rash  . Doxycycline Hives, Swelling and Rash  . Sulfamethoxazole-Trimethoprim Rash  . Zithromax [Azithromycin] Hives, Swelling and Rash    Social History   Socioeconomic History  . Marital status: Single    Spouse name: Not on file  . Number of children: Not on file  . Years of  education: Not on file  . Highest education level: Not on file  Occupational History  . Not on file  Social Needs  . Financial resource strain: Not on file  . Food insecurity:    Worry: Not on file    Inability: Not on file  . Transportation needs:    Medical: Not on file    Non-medical: Not on file  Tobacco Use  . Smoking status: Former Smoker    Types: Cigarettes    Last attempt to quit: 03/25/2003    Years since quitting: 14.6  . Smokeless tobacco: Former Systems developer    Quit date: 05/27/1978  Substance and Sexual Activity  . Alcohol use: Yes    Alcohol/week: 0.0 standard drinks    Comment: years ago  no longer beer only  early 20's  . Drug use: Yes    Types: "Crack" cocaine, Other-see comments    Comment: years  ago  . Sexual activity: Yes    Birth control/protection: Other-see comments, None, Abstinence  Lifestyle  . Physical activity:    Days per week: Not on file    Minutes per session: Not on file  . Stress: Not on file  Relationships  . Social connections:    Talks on phone: Not on file    Gets together: Not on file    Attends religious service: Not on file    Active member of club or organization: Not on file    Attends meetings of clubs or organizations: Not on file    Relationship status: Not on file  . Intimate partner violence:    Fear of current or ex partner: Not on file    Emotionally abused: Not on file    Physically abused: Not on file    Forced sexual activity: Not on file  Other Topics Concern  . Not on file  Social History Narrative   ** Merged History Encounter **       Patient has exhausted [facility] financial resources. She does have access to [employer] and [employer]. She should be redirected to purse disability with her attorney and reapply of Medicaid    Family History  Problem Relation Age of Onset  . Asthma Mother   . Diabetes Father   . Stroke Father   . Heart disease Father   .  Arthritis Sister   . Asthma Sister   . Asthma Daughter    . Asthma Son   . Arthritis Sister   . Asthma Sister     Past Surgical History:  Procedure Laterality Date  . BACK SURGERY    . CESAREAN SECTION    . CESAREAN SECTION    . FRACTURE SURGERY    . TUBAL LIGATION      ROS: Review of Systems As stated above PHYSICAL EXAM: BP 90/64   Pulse 76   Temp 98.8 F (37.1 C) (Oral)   Resp 16   Ht 5\' 5"  (1.651 m)   Wt (!) 386 lb (175.1 kg)   LMP 03/24/2009   SpO2 96%   BMI 64.23 kg/m   Wt Readings from Last 3 Encounters:  12/01/17 (!) 386 lb (175.1 kg)  10/15/17 (!) 400 lb (181.4 kg)  07/16/17 (!) 394 lb 14.4 oz (179.1 kg)  Repeat blood pressure using a large cuff was 185/70  Physical Exam  General appearance - alert, disheveled morbidly obese Caucasian female in no distress.  Patient is sitting in a wheelchair.  She is unable to get onto the exam table. Mental status - alert and oriented Neck - supple, no significant adenopathy Chest - clear to auscultation, no wheezes, rales or rhonchi, symmetric air entry Heart - heart sounds decreased but regular Musculoskeletal - knees: large body habits limits exam.  She is able to get up out of the chair and use her cane to ambulate.  Posture is hunched over and gait is slowed Extremities - no Le edema Skin - papular rash on anterior chest over sternum and medial half of both breast.  Similar rash on inner thigh BL Skin under abdominal fold is moist with slight erythema.     ASSESSMENT AND PLAN: 1. Dermatitis - triamcinolone cream (KENALOG) 0.1 %; Apply 1 application topically 2 (two) times daily. To use on chest and legs  Dispense: 45 g; Refill: 1  2. Intertrigo Good skin hygene encouraged - fluconazole (DIFLUCAN) 150 MG tablet; 1 tab po Q wk  Dispense: 3 tablet; Refill: 0  3. Vaginal yeast infection - fluconazole (DIFLUCAN) 150 MG tablet; 1 tab po Q wk  Dispense: 3 tablet; Refill: 0  4. Morbid obesity ([place])  - Hemoglobin A1c  5. OSA (obstructive sleep apnea) - [facility] Sleep  Study; Future  6. Primary osteoarthritis of both knees Recommend Tylenol 500 mg TID OTC.  - Ambulatory referral to Orthopedic Surgery  7. Anxiety Given her untreated OSA, I am reluctant to put her back on benzo and narcotics.  She is agreeable to low dose zoloft - sertraline (ZOLOFT) 50 MG tablet; Take 0.5 tablets (25 mg total) by mouth daily.  Dispense: 30 tablet; Refill: 3  8. Impaired mobility Form signed for her to get her mobility chair fixed  9. Chronic diastolic congestive heart failure ([place]) Dash diet encouraged.  [facility]gh is down from last hosp 06/2017 - furosemide (LASIX) 40 MG tablet; Take 1 tablet (40 mg total) by mouth daily. 1 tab po daily  Dispense: 60 tablet; Refill: 3 - CBC - Comprehensive metabolic panel - Lipid panel  10. Essential hypertension - cloNIDine (CATAPRES) 0.2 MG tablet; Take 1 tablet (0.2 mg total) by mouth 2 (two) times daily.  Dispense: 60 tablet; Refill: 6  LCSW or case worker to see pt today to do a needs assessment.  30 minutes spent with this pt face to face doing counseling and coordination of care Patient was given  the opportunity to ask questions.  Patient verbalized understanding of the plan and was able to repeat key elements of the plan.   No orders of the defined types were placed in this encounter.    Requested Prescriptions    No prescriptions requested or ordered in this encounter    No follow-ups on file.  Karle Plumber, MD, FACP

## 2017-12-02 ENCOUNTER — Other Ambulatory Visit: Payer: Self-pay | Admitting: Internal Medicine

## 2017-12-02 ENCOUNTER — Telehealth: Payer: Self-pay | Admitting: Internal Medicine

## 2017-12-02 LAB — COMPREHENSIVE METABOLIC PANEL
A/G RATIO: 1.4 (ref 1.2–2.2)
ALBUMIN: 4.2 g/dL (ref 3.5–5.5)
ALT: 8 IU/L (ref 0–32)
AST: 11 IU/L (ref 0–40)
Alkaline Phosphatase: 68 IU/L (ref 39–117)
BILIRUBIN TOTAL: 0.3 mg/dL (ref 0.0–1.2)
BUN / CREAT RATIO: 16 (ref 9–23)
BUN: 30 mg/dL — ABNORMAL HIGH (ref 6–24)
CO2: 27 mmol/L (ref 20–29)
Calcium: 9.2 mg/dL (ref 8.7–10.2)
Chloride: 97 mmol/L (ref 96–106)
Creatinine, Ser: 1.87 mg/dL — ABNORMAL HIGH (ref 0.57–1.00)
GFR, EST AFRICAN AMERICAN: 35 mL/min/{1.73_m2} — AB (ref >59)
GFR, EST NON AFRICAN AMERICAN: 30 mL/min/{1.73_m2} — AB (ref >59)
GLOBULIN, TOTAL: 2.9 g/dL (ref 1.5–4.5)
Glucose: 88 mg/dL (ref 65–99)
Potassium: 5 mmol/L (ref 3.5–5.2)
SODIUM: 142 mmol/L (ref 134–144)
TOTAL PROTEIN: 7.1 g/dL (ref 6.0–8.5)

## 2017-12-02 LAB — CBC
HEMATOCRIT: 40.7 % (ref 34.0–46.6)
Hemoglobin: 12.8 g/dL (ref 11.1–15.9)
MCH: 29.2 pg (ref 26.6–33.0)
MCHC: 31.4 g/dL — ABNORMAL LOW (ref 31.5–35.7)
MCV: 93 fL (ref 79–97)
PLATELETS: 217 10*3/uL (ref 150–450)
RBC: 4.39 x10E6/uL (ref 3.77–5.28)
RDW: 14.1 % (ref 12.3–15.4)
WBC: 8.1 10*3/uL (ref 3.4–10.8)

## 2017-12-02 LAB — LIPID PANEL
CHOL/HDL RATIO: 5.1 ratio — AB (ref 0.0–4.4)
Cholesterol, Total: 190 mg/dL (ref 100–199)
HDL: 37 mg/dL — AB (ref >39)
LDL Calculated: 101 mg/dL — ABNORMAL HIGH (ref 0–99)
Triglycerides: 261 mg/dL — ABNORMAL HIGH (ref 0–149)
VLDL Cholesterol Cal: 52 mg/dL — ABNORMAL HIGH (ref 5–40)

## 2017-12-02 LAB — HEMOGLOBIN A1C
ESTIMATED AVERAGE GLUCOSE: 111 mg/dL
HEMOGLOBIN A1C: 5.5 % (ref 4.8–5.6)

## 2017-12-02 MED ORDER — PANTOPRAZOLE SODIUM 40 MG PO TBEC
40.0000 mg | DELAYED_RELEASE_TABLET | Freq: Every day | ORAL | 5 refills | Status: DC
Start: 2017-12-02 — End: 2018-11-22

## 2017-12-02 MED ORDER — LISINOPRIL 40 MG PO TABS: 40.0000 mg | ORAL_TABLET | Freq: Every day | ORAL | 6 refills | Status: DC

## 2017-12-02 MED ORDER — ALLOPURINOL 100 MG PO TABS: 100.0000 mg | ORAL_TABLET | Freq: Every morning | ORAL | 6 refills | Status: DC

## 2017-12-02 MED ORDER — ATORVASTATIN CALCIUM 20 MG PO TABS: 20.0000 mg | ORAL_TABLET | Freq: Every morning | ORAL | 6 refills | Status: DC

## 2017-12-02 MED ORDER — ISOSORBIDE MONONITRATE ER 30 MG PO TB24: 30.0000 mg | ORAL_TABLET | Freq: Every day | ORAL | 6 refills | Status: DC

## 2017-12-02 MED ORDER — ALBUTEROL SULFATE HFA 108 (90 BASE) MCG/ACT IN AERS: 1.0000 | INHALATION_SPRAY | Freq: Four times a day (QID) | RESPIRATORY_TRACT | 6 refills | Status: DC | PRN

## 2017-12-02 NOTE — Telephone Encounter (Signed)
Mickel Baas from [facility] called on behalf of Kara Vincent. Patient stated she needs a refill for her  Hydroxyzine. Please follow up with the pharmacy/patient regarding prescription refill.

## 2017-12-02 NOTE — Telephone Encounter (Signed)
Jake Samples called asking if patient was supposed to be on something for acid reflex. Please let me know so I can call her back at [phone]

## 2017-12-03 MED ORDER — HYDROXYZINE HCL 25 MG PO TABS: 25.0000 mg | ORAL_TABLET | Freq: Three times a day (TID) | ORAL | 1 refills | Status: DC | PRN

## 2017-12-04 ENCOUNTER — Telehealth: Payer: Self-pay

## 2017-12-04 NOTE — Telephone Encounter (Signed)
Tried contacting pt for the second time was unable to reach pt or lvm. Will be mailing letter out   If pt calls back please give results: kidney function is not 100% and slightly worse compared to July. Avoid OTC NSAIDs like Ibuprofen, Aleve etc. Tylenol okay. Good BP control will also help. DM screen is negative. LDL cholesterol is elevated. I sent RF on Lipitor to her pharmacy.

## 2017-12-07 ENCOUNTER — Other Ambulatory Visit: Payer: Self-pay | Admitting: Internal Medicine

## 2017-12-07 DIAGNOSIS — B3731 Acute candidiasis of vulva and vagina: Secondary | ICD-10-CM

## 2017-12-07 DIAGNOSIS — L304 Erythema intertrigo: Secondary | ICD-10-CM

## 2017-12-07 DIAGNOSIS — B373 Candidiasis of vulva and vagina: Secondary | ICD-10-CM

## 2017-12-08 ENCOUNTER — Other Ambulatory Visit: Payer: Self-pay | Admitting: Internal Medicine

## 2017-12-08 DIAGNOSIS — L309 Dermatitis, unspecified: Secondary | ICD-10-CM

## 2017-12-11 ENCOUNTER — Telehealth: Payer: Self-pay | Admitting: Internal Medicine

## 2017-12-11 DIAGNOSIS — B373 Candidiasis of vulva and vagina: Secondary | ICD-10-CM

## 2017-12-11 DIAGNOSIS — B3731 Acute candidiasis of vulva and vagina: Secondary | ICD-10-CM

## 2017-12-11 DIAGNOSIS — L304 Erythema intertrigo: Secondary | ICD-10-CM

## 2017-12-11 MED ORDER — FLUCONAZOLE 150 MG PO TABS: ORAL_TABLET | ORAL | 0 refills | Status: DC

## 2017-12-11 NOTE — Telephone Encounter (Signed)
-  Patient called to request an update on her lab results and to inform provider her headaches have not gone away, if possible please call back with advice and results.  -pt wants to know why fluconazole (DIFLUCAN) 150 MG tablet had not been filled

## 2017-12-11 NOTE — Telephone Encounter (Signed)
Will forward to pcp  Mailed pt results lab results

## 2017-12-16 ENCOUNTER — Other Ambulatory Visit: Payer: Self-pay | Admitting: Internal Medicine

## 2017-12-26 IMAGING — CR DG CHEST 2V
2 series · 2 of 2 positions shown · non-contrast
Comparison: 12/13/2014

CLINICAL DATA: Dyspnea and cough 3 days

EXAM:
CHEST  2 VIEW

[x chest ap]
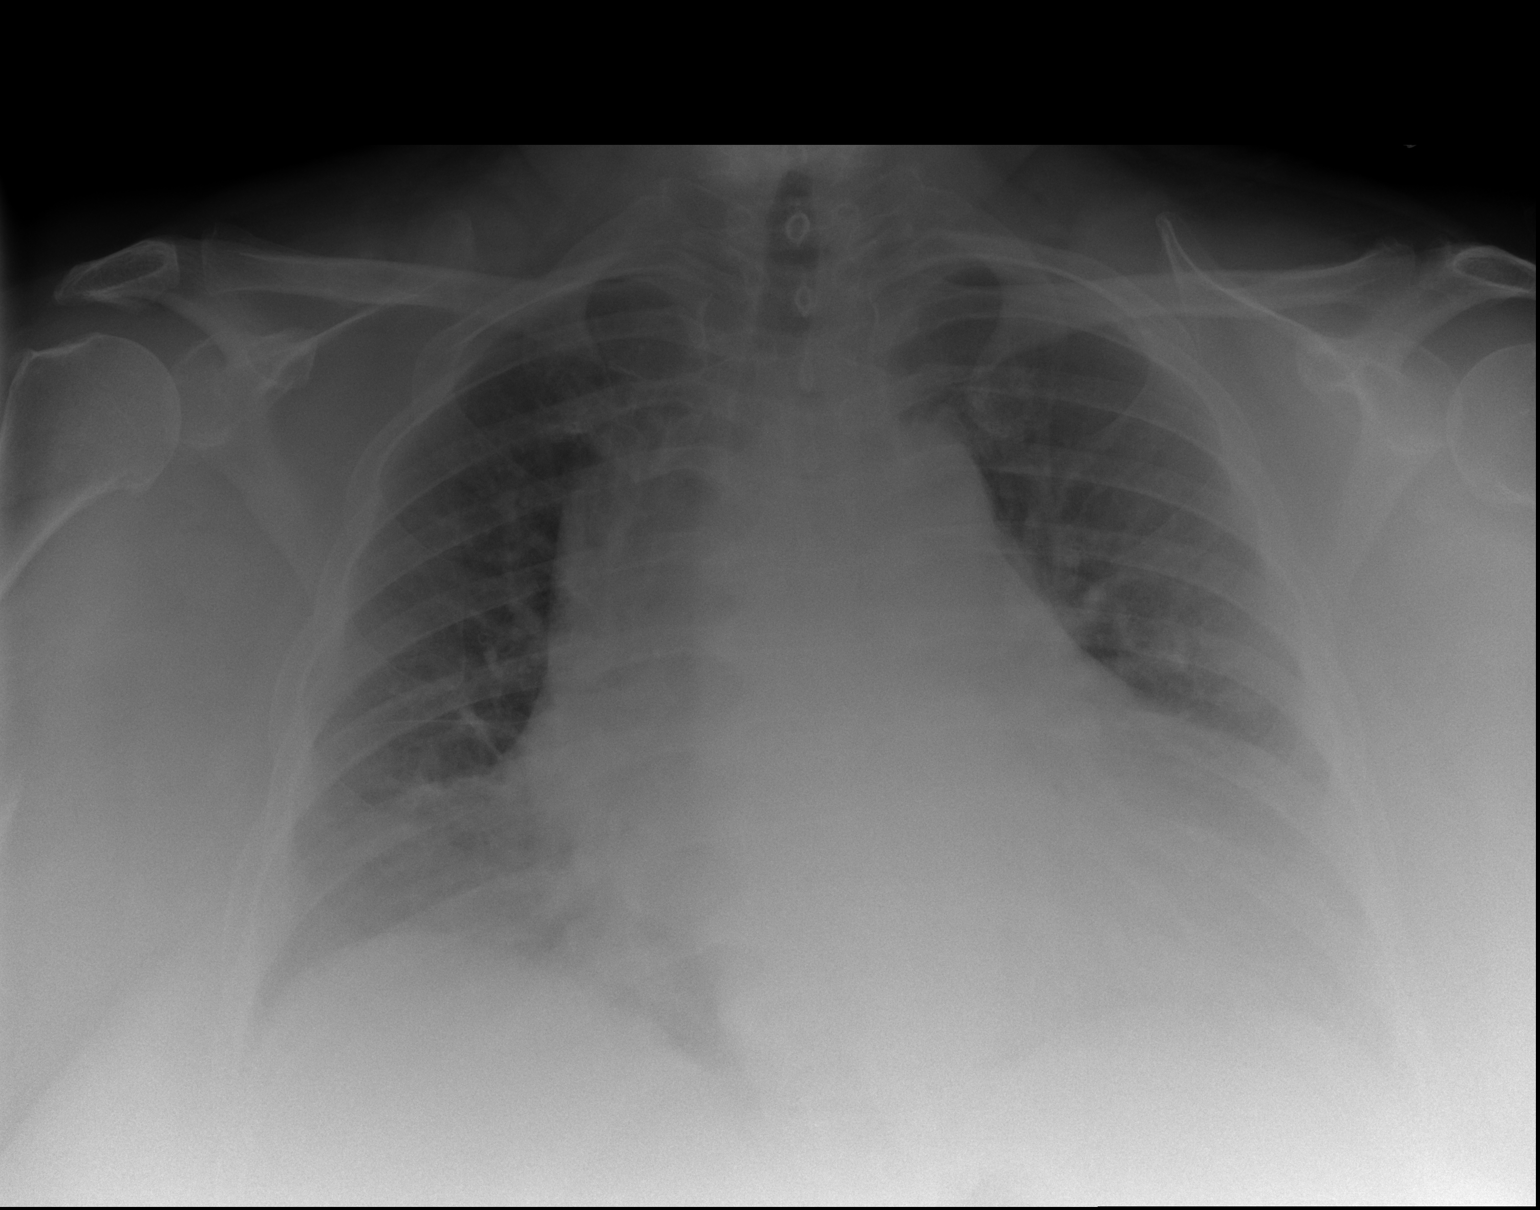

[w chest lat]
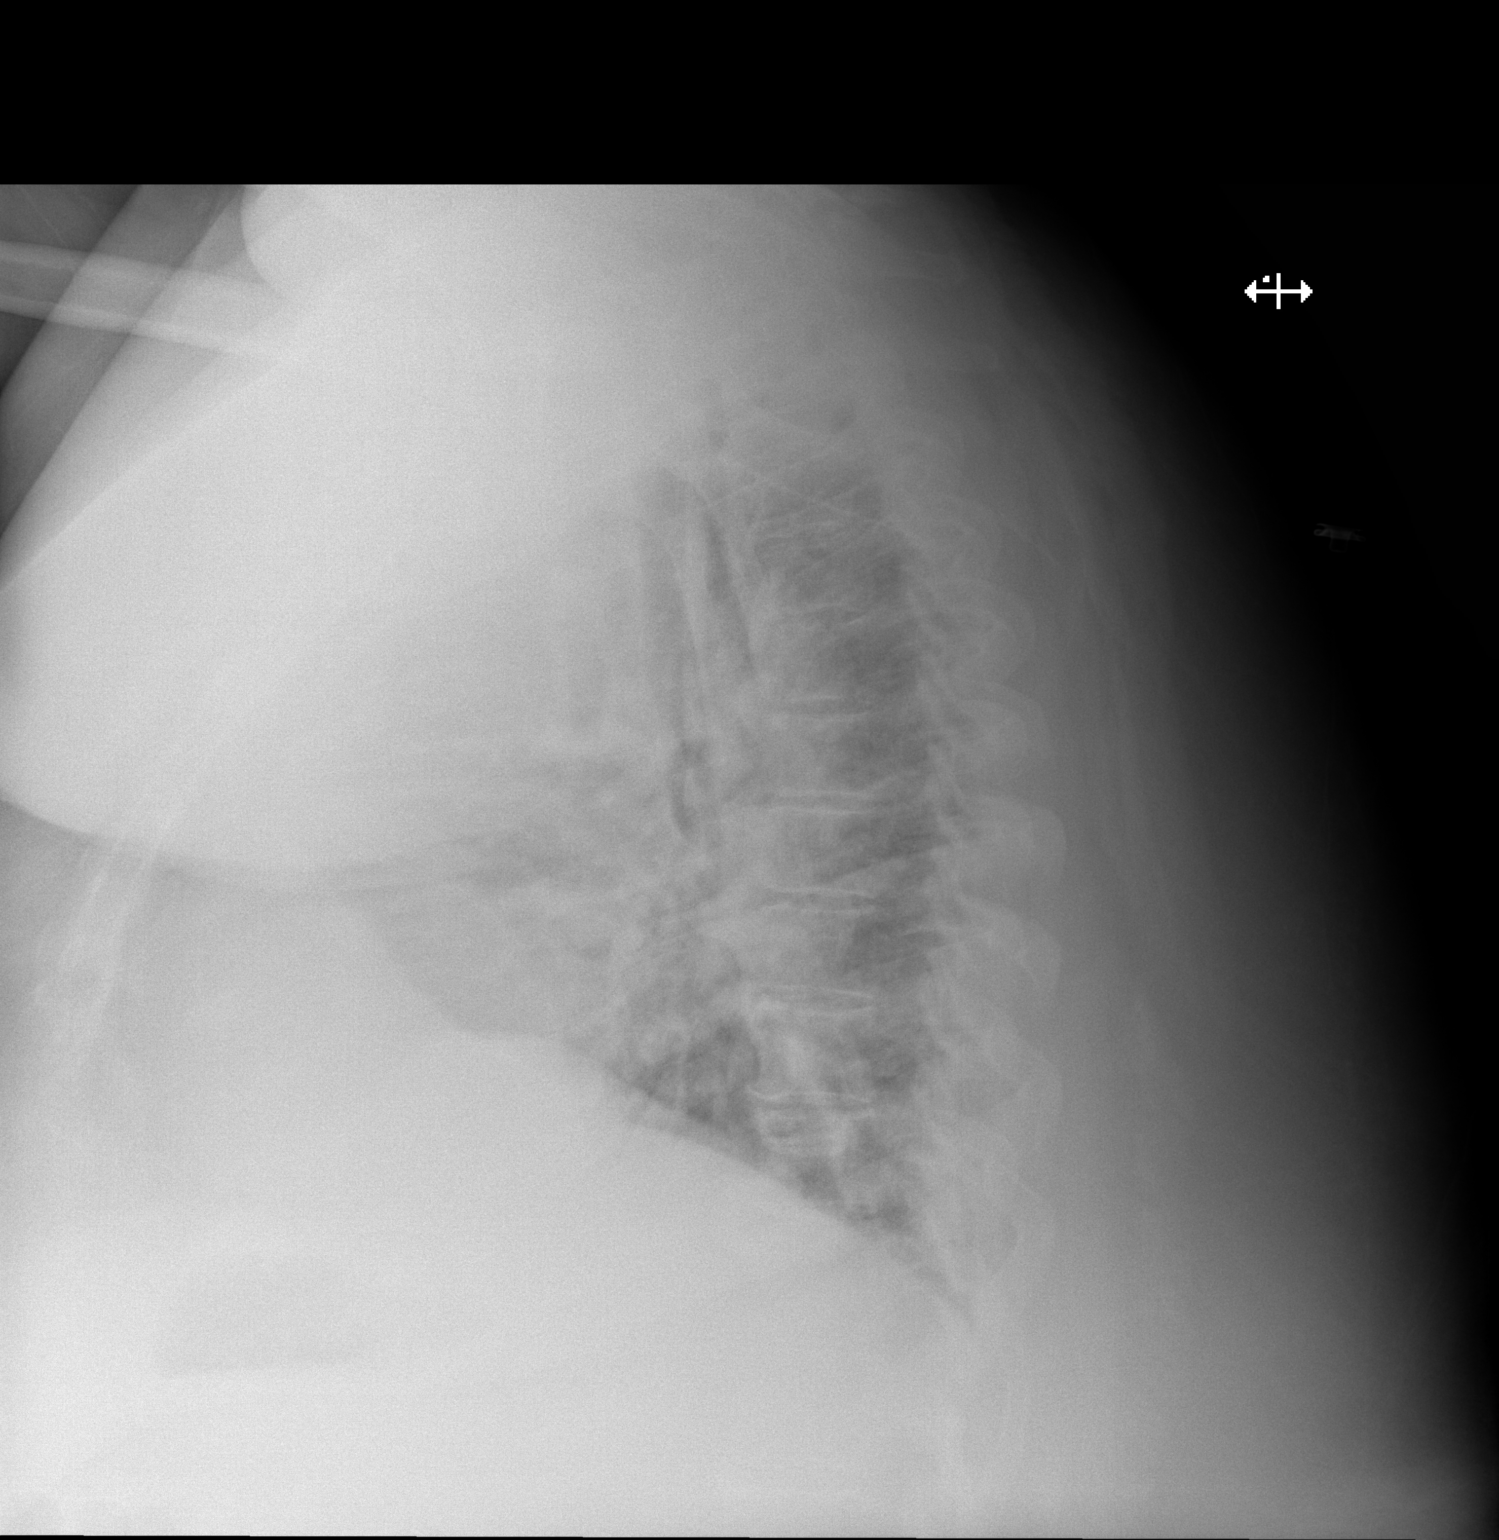

[2 of 2 positions shown; findings below may reference images not displayed]

FINDINGS: There is unchanged cardiomegaly. No confluent airspace
consolidation. No effusions. Pulmonary vasculature is grossly
normal. Study limited by body habitus.
IMPRESSION: Unchanged cardiomegaly.  No acute cardiopulmonary findings.

## 2017-12-28 ENCOUNTER — Other Ambulatory Visit: Payer: Self-pay

## 2017-12-28 ENCOUNTER — Other Ambulatory Visit: Payer: Self-pay | Admitting: Internal Medicine

## 2017-12-28 DIAGNOSIS — L309 Dermatitis, unspecified: Secondary | ICD-10-CM

## 2017-12-28 MED ORDER — TRIAMCINOLONE ACETONIDE 0.1 % EX CREA: TOPICAL_CREAM | CUTANEOUS | 0 refills | Status: DC

## 2017-12-28 NOTE — Telephone Encounter (Signed)
Pharmacy called at patients request, they had her RX for triamcinolone written on 12/08/17 for 45g, the patient told them "she is a big girl" and she needs more than that, I authorized a 1lb jar of triamcinolone with no refills for the patient, it was given to the pharmacist as a verbal order.

## 2018-01-05 ENCOUNTER — Encounter: Payer: Self-pay | Admitting: Internal Medicine

## 2018-01-05 ENCOUNTER — Ambulatory Visit: Payer: Medicaid Other | Attending: Internal Medicine | Admitting: Internal Medicine

## 2018-01-05 VITALS — BP 112/76 | HR 56 | Temp 98.7°F | Resp 16 | Wt 385.4 lb

## 2018-01-05 DIAGNOSIS — Z833 Family history of diabetes mellitus: Secondary | ICD-10-CM | POA: Diagnosis not present

## 2018-01-05 DIAGNOSIS — Z823 Family history of stroke: Secondary | ICD-10-CM | POA: Insufficient documentation

## 2018-01-05 DIAGNOSIS — Z87891 Personal history of nicotine dependence: Secondary | ICD-10-CM | POA: Diagnosis not present

## 2018-01-05 DIAGNOSIS — I130 Hypertensive heart and chronic kidney disease with heart failure and stage 1 through stage 4 chronic kidney disease, or unspecified chronic kidney disease: Secondary | ICD-10-CM | POA: Diagnosis not present

## 2018-01-05 DIAGNOSIS — Z9851 Tubal ligation status: Secondary | ICD-10-CM | POA: Diagnosis not present

## 2018-01-05 DIAGNOSIS — Z8249 Family history of ischemic heart disease and other diseases of the circulatory system: Secondary | ICD-10-CM | POA: Diagnosis not present

## 2018-01-05 DIAGNOSIS — Z886 Allergy status to analgesic agent status: Secondary | ICD-10-CM | POA: Diagnosis not present

## 2018-01-05 DIAGNOSIS — L309 Dermatitis, unspecified: Secondary | ICD-10-CM | POA: Insufficient documentation

## 2018-01-05 DIAGNOSIS — K219 Gastro-esophageal reflux disease without esophagitis: Secondary | ICD-10-CM | POA: Diagnosis not present

## 2018-01-05 DIAGNOSIS — Z79899 Other long term (current) drug therapy: Secondary | ICD-10-CM | POA: Diagnosis not present

## 2018-01-05 DIAGNOSIS — F419 Anxiety disorder, unspecified: Secondary | ICD-10-CM | POA: Diagnosis not present

## 2018-01-05 DIAGNOSIS — Z9889 Other specified postprocedural states: Secondary | ICD-10-CM | POA: Diagnosis not present

## 2018-01-05 DIAGNOSIS — Z880 Allergy status to penicillin: Secondary | ICD-10-CM | POA: Diagnosis not present

## 2018-01-05 DIAGNOSIS — Z882 Allergy status to sulfonamides status: Secondary | ICD-10-CM | POA: Insufficient documentation

## 2018-01-05 DIAGNOSIS — N183 Chronic kidney disease, stage 3 unspecified: Secondary | ICD-10-CM

## 2018-01-05 DIAGNOSIS — E785 Hyperlipidemia, unspecified: Secondary | ICD-10-CM | POA: Insufficient documentation

## 2018-01-05 DIAGNOSIS — Z881 Allergy status to other antibiotic agents status: Secondary | ICD-10-CM | POA: Insufficient documentation

## 2018-01-05 DIAGNOSIS — M170 Bilateral primary osteoarthritis of knee: Secondary | ICD-10-CM | POA: Insufficient documentation

## 2018-01-05 DIAGNOSIS — F1190 Opioid use, unspecified, uncomplicated: Secondary | ICD-10-CM

## 2018-01-05 DIAGNOSIS — I5032 Chronic diastolic (congestive) heart failure: Secondary | ICD-10-CM | POA: Insufficient documentation

## 2018-01-05 DIAGNOSIS — G4733 Obstructive sleep apnea (adult) (pediatric): Secondary | ICD-10-CM | POA: Diagnosis not present

## 2018-01-05 MED ORDER — ACETAMINOPHEN-CODEINE #3 300-30 MG PO TABS: 1.0000 | ORAL_TABLET | Freq: Three times a day (TID) | ORAL | 0 refills | Status: DC | PRN

## 2018-01-05 MED ORDER — SERTRALINE HCL 50 MG PO TABS: 50.0000 mg | ORAL_TABLET | Freq: Every day | ORAL | 3 refills | Status: DC

## 2018-01-05 MED ORDER — MUPIROCIN CALCIUM 2 % EX CREA: TOPICAL_CREAM | CUTANEOUS | 0 refills | Status: DC

## 2018-01-05 NOTE — Patient Instructions (Signed)
Increase Zoloft to 50 mg 1 tablet daily.  Use the Tylenol 3's as prescribed as needed for knee pain.  Please go to [facility] to get x-rays done on the knees as ordered.

## 2018-01-05 NOTE — Progress Notes (Signed)
Patient ID: Kara Vincent, female    DOB: 1965/05/20  MRN: [id number]  CC: Follow-up   Subjective: Kara Vincent is a 52 y.o. female who presents for 1 mth f/u.  Pt was 1 hr late for her appt. last seen 1 month ago Her concerns today include:  Patient with history of asthma, obstructive sleep apnea, HTN, diastolic heart failure, aortic stenosis and CKD stage III, morbid obesity, chronic pain syndrome, gout, urge incontinence  Anxiety:  Feels Zoloft helps some but not as good as "my Xanax did."  Wanting to be placed on Xanax.  Taking Hydroxyzine once a day.  C/o pain in pain in knees and back of legs.  "I can't hardly stand up they hurt so bad."  Referred to orthopedics on last visit but they were unable to reach her with the phone numbers that are on her chart.  Advised patient to update her information at the front desk today before leaving. -She was able to get her mobility chair fix.  She gets around at home in her mobility chair and sometimes uses her cane.  Wanting to know if there is a shot that I could give her to get rid of yeast on the outside of her vagina.  Patient prescribed Diflucan on last visit with me in a few times since then.  She denies any vaginal itching.  She states that she has some cysts on the outside of the vagina that have been there for a long time and are uncomfortable.    OSA: Referred for sleep study on last visit.  She states that she has not been called as yet for that appointment but I suspect they may have had problems reaching her to schedule it.  She reports being on oxygen 3 lt for yrs due to asthma.  Wanting to know if we have any extra tubing that she can use with her device.  [facility] Patient Active Problem List   Diagnosis Date Noted  . Morbid obesity ([place]) 12/01/2017  . Primary osteoarthritis of both knees 12/01/2017  . Anxiety 12/01/2017  . Impaired mobility 12/01/2017  . Essential hypertension  12/14/2015  . HLD (hyperlipidemia) 12/14/2015  . GERD (gastroesophageal reflux disease) 12/14/2015  . Gout 12/14/2015  . Darier's disease   . Leukocytosis   . 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 Right-sided heart failure ([place]) 11/20/2014  . OSA (obstructive sleep apnea)   . CKD (chronic kidney disease) stage 3, GFR 30-59 ml/min (HCC) 11/17/2014  . Asthma exacerbation 11/16/2014  . Venous stasis 11/16/2014  . Acute on chronic respiratory failure with hypoxia and hypercapnia ([place]) 11/16/2014  . Chronic pain 08/24/2011  . Obesity hypoventilation syndrome ([place]) 08/24/2011     Current Outpatient Medications on File Prior to Visit  Medication Sig Dispense Refill  . albuterol (PROVENTIL HFA;VENTOLIN HFA) 108 (90 Base) MCG/ACT inhaler Inhale 1-2 puffs into the lungs every 6 (six) hours as needed for wheezing or shortness of breath. 1 Inhaler 6  . allopurinol (ZYLOPRIM) 100 MG tablet Take 1 tablet (100 mg total) by mouth every morning. 30 tablet 6  . atorvastatin (LIPITOR) 20 MG tablet Take 1 tablet (20 mg total) by mouth every morning. 30 tablet 6  . cloNIDine (CATAPRES) 0.2 MG tablet Take 1 tablet (0.2 mg total) by mouth 2 (two) times daily. 60 tablet 6  . fluconazole (DIFLUCAN) 150 MG tablet 1 tab po Q wk (Patient not taking: Reported on 01/05/2018) 3 tablet 0  .  furosemide (LASIX) 40 MG tablet Take 1 tablet (40 mg total) by mouth daily. 1 tab po daily 60 tablet 3  . hydrOXYzine (ATARAX/VISTARIL) 25 MG tablet Take 1 tablet (25 mg total) by mouth 3 (three) times daily as needed for anxiety or itching (anxiety and itching). 90 tablet 0  . ipratropium-albuterol (DUONEB) 0.5-2.5 (3) MG/3ML SOLN Take 3 mLs by nebulization every 6 (six) hours as needed (SOB). 3 mL 0  . isosorbide mononitrate (IMDUR) 30 MG 24 hr tablet Take 1 tablet (30 mg total) by mouth daily. 30 tablet 6  . lisinopril (PRINIVIL,ZESTRIL) 40 MG tablet Take 1 tablet (40 mg total) by mouth daily. 20 tablet 6  . nystatin cream (MYCOSTATIN)  APPLY TOPICALLY TO THE AFFECTED AREA(S) TWICE DAILY 60 g 0  . pantoprazole (PROTONIX) 40 MG tablet Take 1 tablet (40 mg total) by mouth daily. 30 tablet 5  . solifenacin (VESICARE) 5 MG tablet Take 1 tablet (5 mg total) by mouth every morning. 30 tablet 6  . triamcinolone cream (KENALOG) 0.1 % APPLY TOPICALLY TO THE CHEST AND LEGS TWICE DAILY 454 g 0  . [DISCONTINUED] diphenhydrAMINE (SOMINEX) 25 MG tablet Take 25 mg by mouth at bedtime as needed.     No current facility-administered medications on file prior to visit.     Allergies  Allergen Reactions  . Ibuprofen Hives, Shortness Of Breath, Palpitations and Rash    Has tolerated toradol 12/2013 inpatient  . Penicillins Anaphylaxis, Hives and Rash    Has patient had a PCN reaction causing immediate rash, facial/tongue/throat swelling, SOB or lightheadedness with hypotension: Yes Has patient had a PCN reaction causing severe rash involving mucus membranes or skin necrosis: Yes Has patient had a PCN reaction that required hospitalization Unknown Has patient had a PCN reaction occurring within the last 10 years: Unknown If all of the above answers are "NO", then may proceed with Cephalosporin use.   . Sulfa Antibiotics Hives, Shortness Of Breath, Rash and Cough  . Adhesive [Tape] Rash  . Doxycycline Hives, Swelling and Rash  . Sulfamethoxazole-Trimethoprim Rash  . Zithromax [Azithromycin] Hives, Swelling and Rash    Social History   Socioeconomic History  . Marital status: Single    Spouse name: Not on file  . Number of children: Not on file  . Years of education: Not on file  . Highest education level: Not on file  Occupational History  . Not on file  Social Needs  . Financial resource strain: Not on file  . Food insecurity:    Worry: Not on file    Inability: Not on file  . Transportation needs:    Medical: Not on file    Non-medical: Not on file  Tobacco Use  . Smoking status: Former Smoker    Types: Cigarettes     Last attempt to quit: 03/25/2003    Years since quitting: 14.7  . Smokeless tobacco: Former Systems developer    Quit date: 05/27/1978  Substance and Sexual Activity  . Alcohol use: Yes    Alcohol/week: 0.0 standard drinks    Comment: years ago  no longer beer only  early 20's  . Drug use: Yes    Types: "Crack" cocaine, Other-see comments    Comment: years  ago  . Sexual activity: Yes    Birth control/protection: Other-see comments, None, Abstinence  Lifestyle  . Physical activity:    Days per week: Not on file    Minutes per session: Not on file  . Stress: Not on  file  Relationships  . Social connections:    Talks on phone: Not on file    Gets together: Not on file    Attends religious service: Not on file    Active member of club or organization: Not on file    Attends meetings of clubs or organizations: Not on file    Relationship status: Not on file  . Intimate partner violence:    Fear of current or ex partner: Not on file    Emotionally abused: Not on file    Physically abused: Not on file    Forced sexual activity: Not on file  Other Topics Concern  . Not on file  Social History Narrative   ** Merged History Encounter **       Patient has exhausted [facility] financial resources. She does have access to [employer] and [employer]. She should be redirected to purse disability with her attorney and reapply of Medicaid    Family History  Problem Relation Age of Onset  . Asthma Mother   . Diabetes Father   . Stroke Father   . Heart disease Father   . Arthritis Sister   . Asthma Sister   . Asthma Daughter   . Asthma Son   . Arthritis Sister   . Asthma Sister     Past Surgical History:  Procedure Laterality Date  . BACK SURGERY    . CESAREAN SECTION    . CESAREAN SECTION    . FRACTURE SURGERY    . TUBAL LIGATION      ROS: Review of Systems Skin: Complains of some itching over the right flank. PHYSICAL EXAM: BP 112/76   Pulse (!) 56   Temp 98.7 F (37.1 C) (Oral)    Resp 16   Wt (!) 385 lb 6.4 oz (174.8 kg)   LMP 03/24/2009   SpO2 92%   BMI 64.13 kg/m   Wt Readings from Last 3 Encounters:  01/05/18 (!) 385 lb 6.4 oz (174.8 kg)  12/01/17 (!) 386 lb (175.1 kg)  10/15/17 (!) 400 lb (181.4 kg)    Physical Exam  General appearance -morbidly obese Caucasian female sitting in a mobility chair in NAD Mental status -flat affect Chest - clear to auscultation, no wheezes, rales or rhonchi, symmetric air entry Heart - normal rate, regular rhythm, normal S1, S2, no murmurs, rubs, clicks or gallops Musculoskeletal -knees: Large body habitus.  Mild to moderate discomfort with passive range of motion.  No lower extremity edema Extremities -no lower extremity edema. GU: Patient declined having genital exam today. Skin: Several excoriation marks over the right flank  Results for orders placed or performed in visit on 12/01/17  CBC  Result Value Ref Range   WBC 8.1 3.4 - 10.8 x10E3/uL   RBC 4.39 3.77 - 5.28 x10E6/uL   Hemoglobin 12.8 11.1 - 15.9 g/dL   Hematocrit 40.7 34.0 - 46.6 %   MCV 93 79 - 97 fL   MCH 29.2 26.6 - 33.0 pg   MCHC 31.4 (L) 31.5 - 35.7 g/dL   RDW 14.1 12.3 - 15.4 %   Platelets 217 150 - 450 x10E3/uL  Comprehensive metabolic panel  Result Value Ref Range   Glucose 88 65 - 99 mg/dL   BUN 30 (H) 6 - 24 mg/dL   Creatinine, Ser 1.87 (H) 0.57 - 1.00 mg/dL   GFR calc non Af Amer 30 (L) >59 mL/min/1.73   GFR calc Af Amer 35 (L) >59 mL/min/1.73   BUN/Creatinine Ratio  16 9 - 23   Sodium 142 134 - 144 mmol/L   Potassium 5.0 3.5 - 5.2 mmol/L   Chloride 97 96 - 106 mmol/L   CO2 27 20 - 29 mmol/L   Calcium 9.2 8.7 - 10.2 mg/dL   Total Protein 7.1 6.0 - 8.5 g/dL   Albumin 4.2 3.5 - 5.5 g/dL   Globulin, Total 2.9 1.5 - 4.5 g/dL   Albumin/Globulin Ratio 1.4 1.2 - 2.2   Bilirubin Total 0.3 0.0 - 1.2 mg/dL   Alkaline Phosphatase 68 39 - 117 IU/L   AST 11 0 - 40 IU/L   ALT 8 0 - 32 IU/L  Lipid panel  Result Value Ref Range   Cholesterol,  Total 190 100 - 199 mg/dL   Triglycerides 261 (H) 0 - 149 mg/dL   HDL 37 (L) >39 mg/dL   VLDL Cholesterol Cal 52 (H) 5 - 40 mg/dL   LDL Calculated 101 (H) 0 - 99 mg/dL   Chol/HDL Ratio 5.1 (H) 0.0 - 4.4 ratio  Hemoglobin A1c  Result Value Ref Range   Hgb A1c MFr Bld 5.5 4.8 - 5.6 %   Est. average glucose Bld gHb Est-mCnc 111 mg/dL    ASSESSMENT AND PLAN: 1. Anxiety Recommend increase Zoloft to 50 mg daily. I declined putting her on benzodiazepine.  I advised against it given untreated OSA.  I also learned today that she is on or supposed to be on home O2 3 L continuous.  She may also have a component of obesity hypoventilation syndrome - sertraline (ZOLOFT) 50 MG tablet; Take 1 tablet (50 mg total) by mouth daily.  Dispense: 30 tablet; Refill: 3  2. Primary osteoarthritis of both knees Discussed putting her on Tyl #3 to use as needed.  NSAID is not an option given her kidney function.  Patient denies any history of substance abuse.  I went over possible side effects of the medication including drowsiness and constipation.  She was agreeable to doing a controlled substance agreement with [place].  My CMA went over this with her.  She was unable to give [place] a urine sample today so we did serum drug screen NCCSRS reviewed - DG Knee Complete 4 Views Left; Future - DG Knee Complete 4 Views Right; Future  3. OSA (obstructive sleep apnea) - PSG Sleep Study; Future  4. Dermatitis - mupirocin cream (BACTROBAN) 2 %; Apply to affected area twice a day  Dispense: 15 g; Refill: 0  5. CKD (chronic kidney disease) stage 3, GFR 30-59 ml/min (HCC) Will monitor.  Patient told not to take any NSAIDs. Blood pressure is good today.  6. Opioid use - Drug Screen 13 w/Conf, WB   Patient was given the opportunity to ask questions.  Patient verbalized understanding of the plan and was able to repeat key elements of the plan.   Orders Placed This Encounter  Procedures  . DG Knee Complete 4 Views Left  . DG  Knee Complete 4 Views Right  . Drug Screen 13 w/Conf, WB  . PSG Sleep Study     Requested Prescriptions   Signed Prescriptions Disp Refills  . acetaminophen-codeine (TYLENOL #3) 300-30 MG tablet 70 tablet 0    Sig: Take 1 tablet by mouth every 8 (eight) hours as needed for moderate pain. Each prescription to last one month  . sertraline (ZOLOFT) 50 MG tablet 30 tablet 3    Sig: Take 1 tablet (50 mg total) by mouth daily.  . mupirocin cream (BACTROBAN) 2 %  15 g 0    Sig: Apply to affected area twice a day    Return in about 2 months (around 03/07/2018).  Karle Plumber, MD, FACP

## 2018-01-05 NOTE — Progress Notes (Signed)
Pt states she is having some leg swelling  Pt states she is having pain in her b/l legs, hands and sides

## 2018-01-08 LAB — DRUG SCREEN 13 W/CONF, WB
AMPHETAMINES, IA: NEGATIVE ng/mL
Barbiturates, IA: NEGATIVE ug/mL
Benzodiazepines, IA: NEGATIVE ng/mL
COCAINE/METABOLITE,IA: NEGATIVE ng/mL
Fentanyl, IA: NEGATIVE ng/mL
MEPERIDINE, IA: NEGATIVE ng/mL
Methadone, IA: NEGATIVE ng/mL
OXYCODONES, IA: NEGATIVE ng/mL
Opiates, IA: NEGATIVE ng/mL
PHENCYCLIDINE, IA: NEGATIVE ng/mL
Propoxyphene, IA: NEGATIVE ng/mL
THC (Marijuana) Mtb, IA: NEGATIVE ng/mL
Tramadol, IA: NEGATIVE ng/mL

## 2018-01-12 ENCOUNTER — Other Ambulatory Visit: Payer: Self-pay

## 2018-01-12 MED ORDER — MUPIROCIN 2 % EX OINT: 1.0000 "application " | TOPICAL_OINTMENT | Freq: Two times a day (BID) | CUTANEOUS | 0 refills | Status: DC

## 2018-01-12 NOTE — Telephone Encounter (Signed)
Switched mupirocin from cream to ointment due to insurance coverage.

## 2018-01-22 ENCOUNTER — Telehealth: Payer: Self-pay | Admitting: Internal Medicine

## 2018-01-22 NOTE — Telephone Encounter (Signed)
Patient called stating that she received a call. Patient would like to know if her nurse had called her. Please f/u

## 2018-02-11 ENCOUNTER — Other Ambulatory Visit: Payer: Self-pay

## 2018-02-11 MED ORDER — HYDROXYZINE HCL 25 MG PO TABS: 25.0000 mg | ORAL_TABLET | Freq: Three times a day (TID) | ORAL | 2 refills | Status: DC | PRN

## 2018-02-25 LAB — BLOOD GAS, VENOUS
Acid-Base Excess: 4.8 mmol/L — ABNORMAL HIGH (ref 0.0–2.0)
Bicarbonate: 34.3 mmol/L — ABNORMAL HIGH (ref 20.0–28.0)
DRAWN BY: 51425
FIO2: 21
O2 SAT: 76.6 %
PATIENT TEMPERATURE: 98.6
PCO2 VEN: 82.9 mmHg — AB (ref 44.0–60.0)
pH, Ven: 7.24 — ABNORMAL LOW (ref 7.250–7.430)
pO2, Ven: 47.7 mmHg — ABNORMAL HIGH (ref 32.0–45.0)

## 2018-03-12 ENCOUNTER — Ambulatory Visit: Payer: Medicaid Other | Admitting: Internal Medicine

## 2018-03-19 ENCOUNTER — Ambulatory Visit: Payer: Medicaid Other

## 2018-03-29 ENCOUNTER — Other Ambulatory Visit: Payer: Self-pay

## 2018-03-29 ENCOUNTER — Encounter: Payer: Self-pay | Admitting: Nurse Practitioner

## 2018-03-29 ENCOUNTER — Ambulatory Visit: Payer: Self-pay | Admitting: Internal Medicine

## 2018-03-29 ENCOUNTER — Ambulatory Visit: Payer: Medicaid Other | Attending: Internal Medicine | Admitting: Nurse Practitioner

## 2018-03-29 VITALS — BP 140/63 | HR 68 | Temp 99.0°F | Resp 20

## 2018-03-29 DIAGNOSIS — N898 Other specified noninflammatory disorders of vagina: Secondary | ICD-10-CM | POA: Diagnosis not present

## 2018-03-29 DIAGNOSIS — J4540 Moderate persistent asthma, uncomplicated: Secondary | ICD-10-CM

## 2018-03-29 DIAGNOSIS — M25561 Pain in right knee: Secondary | ICD-10-CM | POA: Insufficient documentation

## 2018-03-29 DIAGNOSIS — I509 Heart failure, unspecified: Secondary | ICD-10-CM | POA: Insufficient documentation

## 2018-03-29 DIAGNOSIS — Z886 Allergy status to analgesic agent status: Secondary | ICD-10-CM | POA: Diagnosis not present

## 2018-03-29 DIAGNOSIS — M1A9XX0 Chronic gout, unspecified, without tophus (tophi): Secondary | ICD-10-CM | POA: Insufficient documentation

## 2018-03-29 DIAGNOSIS — N183 Chronic kidney disease, stage 3 unspecified: Secondary | ICD-10-CM

## 2018-03-29 DIAGNOSIS — Z833 Family history of diabetes mellitus: Secondary | ICD-10-CM | POA: Insufficient documentation

## 2018-03-29 DIAGNOSIS — I130 Hypertensive heart and chronic kidney disease with heart failure and stage 1 through stage 4 chronic kidney disease, or unspecified chronic kidney disease: Secondary | ICD-10-CM | POA: Insufficient documentation

## 2018-03-29 DIAGNOSIS — G8929 Other chronic pain: Secondary | ICD-10-CM | POA: Insufficient documentation

## 2018-03-29 DIAGNOSIS — Z9981 Dependence on supplemental oxygen: Secondary | ICD-10-CM | POA: Diagnosis not present

## 2018-03-29 DIAGNOSIS — E785 Hyperlipidemia, unspecified: Secondary | ICD-10-CM | POA: Insufficient documentation

## 2018-03-29 DIAGNOSIS — M25562 Pain in left knee: Secondary | ICD-10-CM | POA: Diagnosis not present

## 2018-03-29 DIAGNOSIS — M1990 Unspecified osteoarthritis, unspecified site: Secondary | ICD-10-CM | POA: Insufficient documentation

## 2018-03-29 DIAGNOSIS — K219 Gastro-esophageal reflux disease without esophagitis: Secondary | ICD-10-CM | POA: Diagnosis not present

## 2018-03-29 DIAGNOSIS — Z880 Allergy status to penicillin: Secondary | ICD-10-CM | POA: Diagnosis not present

## 2018-03-29 DIAGNOSIS — N3000 Acute cystitis without hematuria: Secondary | ICD-10-CM | POA: Diagnosis not present

## 2018-03-29 DIAGNOSIS — Z79899 Other long term (current) drug therapy: Secondary | ICD-10-CM | POA: Diagnosis not present

## 2018-03-29 DIAGNOSIS — Z825 Family history of asthma and other chronic lower respiratory diseases: Secondary | ICD-10-CM | POA: Diagnosis not present

## 2018-03-29 DIAGNOSIS — R300 Dysuria: Secondary | ICD-10-CM | POA: Diagnosis present

## 2018-03-29 DIAGNOSIS — Z881 Allergy status to other antibiotic agents status: Secondary | ICD-10-CM | POA: Insufficient documentation

## 2018-03-29 DIAGNOSIS — Z882 Allergy status to sulfonamides status: Secondary | ICD-10-CM | POA: Diagnosis not present

## 2018-03-29 DIAGNOSIS — Z8249 Family history of ischemic heart disease and other diseases of the circulatory system: Secondary | ICD-10-CM | POA: Diagnosis not present

## 2018-03-29 DIAGNOSIS — Z823 Family history of stroke: Secondary | ICD-10-CM | POA: Insufficient documentation

## 2018-03-29 DIAGNOSIS — F419 Anxiety disorder, unspecified: Secondary | ICD-10-CM | POA: Insufficient documentation

## 2018-03-29 DIAGNOSIS — J45909 Unspecified asthma, uncomplicated: Secondary | ICD-10-CM | POA: Insufficient documentation

## 2018-03-29 DIAGNOSIS — G4733 Obstructive sleep apnea (adult) (pediatric): Secondary | ICD-10-CM | POA: Diagnosis not present

## 2018-03-29 DIAGNOSIS — M1A09X0 Idiopathic chronic gout, multiple sites, without tophus (tophi): Secondary | ICD-10-CM

## 2018-03-29 LAB — POCT URINALYSIS DIP (CLINITEK)
Blood, UA: NEGATIVE
Glucose, UA: NEGATIVE mg/dL
Nitrite, UA: POSITIVE — AB
Spec Grav, UA: 1.02 (ref 1.010–1.025)
UROBILINOGEN UA: 1 U/dL
pH, UA: 5 (ref 5.0–8.0)

## 2018-03-29 MED ORDER — HYDROXYZINE HCL 50 MG PO TABS: 50.0000 mg | ORAL_TABLET | Freq: Three times a day (TID) | ORAL | 1 refills | Status: DC | PRN

## 2018-03-29 MED ORDER — ACETAMINOPHEN-CODEINE #3 300-30 MG PO TABS: 1.0000 | ORAL_TABLET | Freq: Three times a day (TID) | ORAL | 0 refills | Status: DC | PRN

## 2018-03-29 MED ORDER — NITROFURANTOIN MONOHYD MACRO 100 MG PO CAPS: 100.0000 mg | ORAL_CAPSULE | Freq: Two times a day (BID) | ORAL | 0 refills | Status: AC

## 2018-03-29 MED ORDER — MISC. DEVICES MISC: 0 refills | Status: AC

## 2018-03-29 MED ORDER — ALBUTEROL SULFATE HFA 108 (90 BASE) MCG/ACT IN AERS: 1.0000 | INHALATION_SPRAY | Freq: Four times a day (QID) | RESPIRATORY_TRACT | 6 refills | Status: DC | PRN

## 2018-03-29 NOTE — Progress Notes (Signed)
Pain in left side and back Knee pain  Cough Refill on indocin

## 2018-03-29 NOTE — Patient Instructions (Signed)
Behavioral Health Resources:  ? ?What if I or someone I know is in crisis? ? ?If you are thinking about harming yourself or having thoughts of suicide, or if you know someone who is, seek help right away. ? ?Call your doctor or mental health care provider. ? ?Call 911 or go to a hospital emergency room to get immediate help, or ask a friend or family member to help you do these things. ? ?Call the [place] National Suicide Prevention Lifeline?s toll-free, 24-hour hotline at 1-800-273-TALK (1-800-273-8255) or TTY: 1-800-799-4 TTY (1-800-799-4889) to talk to a trained counselor. ? ?If you are in crisis, make sure you are not left alone.  ? ?If someone else is in crisis, make sure he or she is not left alone ? ? ?24 Hour Availability ? ?[facility]  ?[address], [place], [place] [address]  ?336-832-9700 or 1-800-711-2635 ? ?[facility] Crisis Line ?(Domestic Violence, Rape & Victim Assistance ?336-273-7273 ? ?[facility]  ?[address] [place], [place]  [address]               1-855-788-8787 or 336-676-6840 ? ?[facility] [place] [facility]    ?(ONLY from 8am-4pm)    ?336-899-1505 ? ?[facility] (24/7)   ?1-877-626-1772 ? ?[place] National Suicide Hotline   ?1-800-273-8255 (TALK) ? ?Support from local police to aid getting patient to hospital (http://www.[place].gov/index.aspx?page=2797) ? ? ?      ?ONGOING BEHAVIORAL HEALTH SUPPORT FOR UNINSURED and UNDERINSURED:  ?[facility]  ?336-676-6840  ?[address]  ?Walk-in first time, Monday-Friday, 8:30am-5:00pm  ?*Bring snack, drink, something to do, long wait at first visit, they do have pharmacy for behavioral health medications/ Bring own interpreter at first visit, if needed ?[facility]  ?336-387-6161  ?[address]  ?Walk-in Monday-Friday, 8:30am-12pm & 1-2:30pm  ?*pacientes que hablen espanol, favor comunicarse con el Sr.  [place], extension 2244 o la [address], extension 3331 para hacer una cita  ?[facility]:  ?336-429-5600 or kellinfoundation@gmail.[email or web address]  ?[address], [address]  ?Call or email, may self-refer  ?* uninsured/underinsured, 19-64yo, have both mental health and substance use challenges  ?[facility]:  ?Phone ([phone]; Fax [phone]  ?*Call to schedule an appointment  ?3rd Floor located @?[address], [address] of [address]  ?Mon-Thursday: 8:30am-8:00pm Friday: 8:30am-7:00pm  ?* Be sure to park in a space labeled ?Psychology Department,? located to the right of the main door of the building. Enter the main doors facing the parking lot and take the elevator or stairs to the 3rd Floor.  ?[facility]:  ?336-832-9700 or  ?1-800-711-2635 (24/hour helpline)  ?[address]  ?Call to make appointment, tends to be a long wait to begin services, depending on insurance  ?[facility]  ?(336) 333-6860 ??  ?*Call to schedule an appointment  ?[address]  ?Monday-Friday, 8:00am-5:00pm  ?[facility]  ?([phone]  ?[address], [place]  ?Monday-Friday, walk-in 8am-3pm  ?First appointment is assessment, then will make appointment for psychiatry   ?  ?

## 2018-03-29 NOTE — Progress Notes (Signed)
Assessment & Plan:  Kara Vincent was seen today for follow-up.  Diagnoses and all orders for this visit:  Acute cystitis without hematuria -     nitrofurantoin, macrocrystal-monohydrate, (MACROBID) 100 MG capsule; Take 1 capsule (100 mg total) by mouth 2 (two) times daily for 5 days. -     CULTURE, URINE COMPREHENSIVE -     POCT URINALYSIS DIP (CLINITEK)  Bilateral chronic knee pain -     Misc. Devices MISC; Please provide patient with a raised toilet seat that is covered under her current insurance plan. -     acetaminophen-codeine (TYLENOL #3) 300-30 MG tablet; Take 1 tablet by mouth every 8 (eight) hours as needed for moderate pain. Each prescription to last one month Patient was instructed to have her xrays performed as instructed.  She is also requesting a referral to pain management however I have instructed her to have her xrays performed first.   Anxiety -     hydrOXYzine (ATARAX/VISTARIL) 50 MG tablet; Take 1 tablet (50 mg total) by mouth 3 (three) times daily as needed for anxiety or itching (anxiety and itching).  Chronic gout of multiple sites, unspecified cause -     Uric Acid She is requesting indocin however this was not prescribed by her PCP and she has CKD. I have instructed her that she should only be taking allopurinol at this time.   Vaginal cysts -     Ambulatory referral to Gynecology  Stage 3 chronic kidney disease ([place]) -     CMP14+EGFR  Moderate asthma without complication, unspecified whether persistent -     albuterol (PROVENTIL HFA;VENTOLIN HFA) 108 (90 Base) MCG/ACT inhaler; Inhale 1-2 puffs into the lungs every 6 (six) hours as needed for wheezing or shortness of breath. Endorses shortness of breath however she is not wearing her O2 today. I have instructed her that she should wear her O2 as instructed. She has also been out of her albuterol inhaler as well.     Patient has been counseled on age-appropriate routine health concerns for screening and  prevention. These are reviewed and up-to-date. Referrals have been placed accordingly. Immunizations are up-to-date or declined.    Subjective:   Chief Complaint  Patient presents with  . Follow-up   HPI Kara Vincent 53 y.o. female presents to office today with various complaints of UTI symptoms, chronic bilateral knee pain, chronic vaginal cysts, anxiety and requesting indocin, xanax and refill of her Tylenol #3. She is also requesting a script for a raised toilet seat.  In regards to her chronic bilateral knee pain xrays were ordered at her last office visit with her PCP in October. However she still has not had those performed since they were ordered 01-05-2018.  PER PCP NOTES dated 01-05-2018: C/o pain in pain in knees and back of legs.  "I can't hardly stand up they hurt so bad."  Referred to orthopedics on last visit but they were unable to reach her with the phone numbers that are on her chart.  Advised patient to update her information at the front desk today before leaving. -She was able to get her mobility chair fix.  She gets around at home in her mobility chair and sometimes uses her cane. She can not take NSAIDs due to CKD history.    Urinary Tract Infection: Patient complains of abnormal smelling urine, burning with urination, left flank pain and incomplete bladder emptying She has had symptoms for a few weeks. Patient also complains of back  pain. Patient denies fever, stomach ache and vaginal discharge. Patient does have a history of recurrent acute cystitis/UTI.  Patient does not have a history of pyelonephritis.   Anxiety She was prescribed hydroxyzine 41m TID which she reports does not provide relief of her anxiety symptoms. She requests xanax however I have instructed her that I do not prescribe benzodiazepines as they are habit forming. She is agreeable to increasing her hydroxyzine from 25 to 558m She denies any thoughts of self harm.  She has the following symptoms:  feelings of losing control, irritable, racing thoughts, shortness of breath. Her symptoms of Onset of symptoms was approximately several months ago, gradually worsening since that time. She denies current suicidal and homicidal ideation. Possible organic causes contributing are: none. Previous treatment includes Xanax  GAD 7 : Generalized Anxiety Score 03/29/2018 12/01/2017  Nervous, Anxious, on Edge 1 2  Control/stop worrying 2 1  Worry too much - different things 1 1  Trouble relaxing 1 1  Restless 2 2  Easily annoyed or irritable 2 1  Afraid - awful might happen 1 1  Total GAD 7 Score 10 9      Review of Systems  Constitutional: Negative for fever, malaise/fatigue and weight loss.  HENT: Negative.  Negative for nosebleeds.   Eyes: Negative.  Negative for blurred vision, double vision and photophobia.  Respiratory: Positive for shortness of breath. Negative for cough.   Cardiovascular: Negative.  Negative for chest pain, palpitations and leg swelling.  Gastrointestinal: Negative.  Negative for heartburn, nausea and vomiting.  Genitourinary: Positive for flank pain and frequency.       History of vaginal cysts  Musculoskeletal: Positive for joint pain. Negative for myalgias.       SEE HPI  Neurological: Positive for weakness. Negative for dizziness, focal weakness, seizures and headaches.  Psychiatric/Behavioral: Negative.  Negative for suicidal ideas. The patient is not nervous/anxious.     Past Medical History:  Diagnosis Date  . Allergy   . Anxiety   . Arthritis   . Asthma   . Asthma   . CH[place] ([place]estive heart failure) ([facility]  . [place]  . Chronic abdominal pain   . Chronic kidney disease   . Chronic pain    "all over"  . 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 GERD (gastroesophageal reflux disease)   . Gout   . Hidradenitis   . HLD (hyperlipidemia) 12/14/2015  . Homelessness   . Hyperlipemia   . Hypertension   . Low back pain    . Morbid obesity ([facility]   uses motor wheel chair  . MRSA (methicillin resistant Staphylococcus aureus)    states about a year ago  . On home O2    3L N/C O2 continuously  . OSA (obstructive sleep apnea)    non-compliant with CPAP  . Oxygen deficiency   . Renal insufficiency   . Sleep apnea     Past Surgical History:  Procedure Laterality Date  . BACK SURGERY    . CESAREAN SECTION    . CESAREAN SECTION    . FRACTURE SURGERY    . TUBAL LIGATION      Family History  Problem Relation Age of Onset  . Asthma Mother   . Diabetes Father   . Stroke Father   . Heart disease Father   . Arthritis Sister   . Asthma Sister   . Asthma Daughter   . Asthma  Son   . Arthritis Sister   . Asthma Sister     Social History Reviewed with no changes to be made today.   Outpatient Medications Prior to Visit  Medication Sig Dispense Refill  . allopurinol (ZYLOPRIM) 100 MG tablet Take 1 tablet (100 mg total) by mouth every morning. 30 tablet 6  . atorvastatin (LIPITOR) 20 MG tablet Take 1 tablet (20 mg total) by mouth every morning. 30 tablet 6  . cloNIDine (CATAPRES) 0.2 MG tablet Take 1 tablet (0.2 mg total) by mouth 2 (two) times daily. 60 tablet 6  . gabapentin (NEURONTIN) 100 MG capsule Take 100 mg by mouth 3 (three) times daily.    . indomethacin (INDOCIN SR) 75 MG CR capsule Take 75 mg by mouth 2 (two) times daily with a meal.    . isosorbide mononitrate (IMDUR) 30 MG 24 hr tablet Take 1 tablet (30 mg total) by mouth daily. 30 tablet 6  . lisinopril (PRINIVIL,ZESTRIL) 40 MG tablet Take 1 tablet (40 mg total) by mouth daily. 20 tablet 6  . pantoprazole (PROTONIX) 40 MG tablet Take 1 tablet (40 mg total) by mouth daily. 30 tablet 5  . solifenacin (VESICARE) 5 MG tablet Take 1 tablet (5 mg total) by mouth every morning. 30 tablet 6  . acetaminophen-codeine (TYLENOL #3) 300-30 MG tablet Take 1 tablet by mouth every 8 (eight) hours as needed for moderate pain. Each prescription to last one  month 70 tablet 0  . hydrOXYzine (ATARAX/VISTARIL) 25 MG tablet Take 1 tablet (25 mg total) by mouth 3 (three) times daily as needed for anxiety or itching (anxiety and itching). 90 tablet 2  . fluconazole (DIFLUCAN) 150 MG tablet 1 tab po Q wk (Patient not taking: Reported on 01/05/2018) 3 tablet 0  . furosemide (LASIX) 40 MG tablet Take 1 tablet (40 mg total) by mouth daily. 1 tab po daily (Patient not taking: Reported on 03/29/2018) 60 tablet 3  . ipratropium-albuterol (DUONEB) 0.5-2.5 (3) MG/3ML SOLN Take 3 mLs by nebulization every 6 (six) hours as needed (SOB). 3 mL 0  . mupirocin cream (BACTROBAN) 2 % Apply to affected area twice a day 15 g 0  . mupirocin ointment (BACTROBAN) 2 % Apply 1 application topically 2 (two) times daily. 22 g 0  . nystatin cream (MYCOSTATIN) APPLY TOPICALLY TO THE AFFECTED AREA(S) TWICE DAILY 60 g 0  . sertraline (ZOLOFT) 50 MG tablet Take 1 tablet (50 mg total) by mouth daily. 30 tablet 3  . triamcinolone cream (KENALOG) 0.1 % APPLY TOPICALLY TO THE CHEST AND LEGS TWICE DAILY 454 g 0  . albuterol (PROVENTIL HFA;VENTOLIN HFA) 108 (90 Base) MCG/ACT inhaler Inhale 1-2 puffs into the lungs every 6 (six) hours as needed for wheezing or shortness of breath. 1 Inhaler 6   No facility-administered medications prior to visit.     Allergies  Allergen Reactions  . Ibuprofen Hives, Shortness Of Breath, Palpitations and Rash    Has tolerated toradol 12/2013 inpatient  . Penicillins Anaphylaxis, Hives and Rash    Has patient had a PCN reaction causing immediate rash, facial/tongue/throat swelling, SOB or lightheadedness with hypotension: Yes Has patient had a PCN reaction causing severe rash involving mucus membranes or skin necrosis: Yes Has patient had a PCN reaction that required hospitalization Unknown Has patient had a PCN reaction occurring within the last 10 years: Unknown If all of the above answers are "NO", then may proceed with Cephalosporin use.   . Sulfa  Antibiotics Hives, Shortness Of  Breath, Rash and Cough  . Adhesive [Tape] Rash  . Doxycycline Hives, Swelling and Rash  . Sulfamethoxazole-Trimethoprim Rash  . Zithromax [Azithromycin] Hives, Swelling and Rash       Objective:    BP 140/63 (BP Location: Right Arm, Patient Position: Sitting, Cuff Size: Large)   Pulse 68   Temp 99 F (37.2 C)   Resp 20   LMP 03/24/2009   SpO2 96%  Wt Readings from Last 3 Encounters:  01/05/18 (!) 385 lb 6.4 oz (174.8 kg)  12/01/17 (!) 386 lb (175.1 kg)  10/15/17 (!) 400 lb (181.4 kg)    Physical Exam Vitals signs and nursing note reviewed.  Constitutional:      Appearance: She is morbidly obese.     Comments: Disheveled.   HENT:     Head: Normocephalic and atraumatic.  Neck:     Musculoskeletal: Normal range of motion.  Cardiovascular:     Rate and Rhythm: Normal rate and regular rhythm.     Heart sounds: Normal heart sounds. No murmur. No friction rub. No gallop.   Pulmonary:     Effort: Pulmonary effort is normal. No tachypnea or respiratory distress.     Breath sounds: Normal breath sounds. No decreased breath sounds, wheezing, rhonchi or rales.  Chest:     Chest wall: No tenderness.  Abdominal:     General: Bowel sounds are normal.     Palpations: Abdomen is soft.  Genitourinary:    Comments: Declines GU exam today . Musculoskeletal: Normal range of motion.     Right ankle: She exhibits swelling.     Left ankle: She exhibits swelling.     Right lower leg: Edema (nonpitting) present.     Left lower leg: Edema (non pitting) present.  Skin:    General: Skin is warm and dry.  Neurological:     Mental Status: She is alert and oriented to person, place, and time.     Motor: Weakness (in portable wheelchair today ) present.     Coordination: Coordination normal.  Psychiatric:        Behavior: Behavior normal. Behavior is cooperative.        Patient has been counseled extensively about nutrition and exercise as well as the  importance of adherence with medications and regular follow-up. The patient was given clear instructions to go to ER or return to medical center if symptoms don't improve, worsen or new problems develop. The patient verbalized understanding.   Follow-up: No follow-ups on file.   Gildardo Pounds, FNP-BC [facility] and [place], [place]   03/29/2018, 9:51 PM

## 2018-03-30 LAB — CMP14+EGFR
ALBUMIN: 3.9 g/dL (ref 3.5–5.5)
ALT: 7 IU/L (ref 0–32)
AST: 9 IU/L (ref 0–40)
Albumin/Globulin Ratio: 1.4 (ref 1.2–2.2)
Alkaline Phosphatase: 61 IU/L (ref 39–117)
BUN / CREAT RATIO: 19 (ref 9–23)
BUN: 35 mg/dL — ABNORMAL HIGH (ref 6–24)
Bilirubin Total: <0.2 mg/dL (ref 0.0–1.2)
CO2: 25 mmol/L (ref 20–29)
Calcium: 8.3 mg/dL — ABNORMAL LOW (ref 8.7–10.2)
Chloride: 106 mmol/L (ref 96–106)
Creatinine, Ser: 1.89 mg/dL — ABNORMAL HIGH (ref 0.57–1.00)
GFR calc Af Amer: 34 mL/min/{1.73_m2} — ABNORMAL LOW (ref >59)
GFR calc non Af Amer: 30 mL/min/{1.73_m2} — ABNORMAL LOW (ref >59)
Globulin, Total: 2.7 g/dL (ref 1.5–4.5)
Glucose: 77 mg/dL (ref 65–99)
Potassium: 5.3 mmol/L — ABNORMAL HIGH (ref 3.5–5.2)
SODIUM: 144 mmol/L (ref 134–144)
TOTAL PROTEIN: 6.6 g/dL (ref 6.0–8.5)

## 2018-03-30 LAB — URIC ACID: Uric Acid: 6.2 mg/dL (ref 2.5–7.1)

## 2018-03-30 MED FILL — !VENTOLIN HFA INHALER: 108 (90 BAS | 25 days supply | Qty: 18 | Fill #0

## 2018-03-30 MED FILL — ACETAMINOPHEN/COD #3 TABLET: 300-30 | 30 days supply | Qty: 70 | Fill #0

## 2018-03-30 MED FILL — NITROFURANTOIN MONO-MCR 100: 100 | 5 days supply | Qty: 10 | Fill #0

## 2018-03-30 MED FILL — hydrOXYzine HCL 50 MG TABS: 50 | 20 days supply | Qty: 60 | Fill #0

## 2018-04-01 LAB — CULTURE, URINE COMPREHENSIVE

## 2018-04-06 ENCOUNTER — Emergency Department (HOSPITAL_COMMUNITY)
Admission: EM | Admit: 2018-04-06 | Discharge: 2018-04-06 | Disposition: A | Discharge status: Home / Self Care | Payer: Medicaid Other | Attending: Emergency Medicine | Admitting: Emergency Medicine

## 2018-04-06 ENCOUNTER — Encounter (HOSPITAL_COMMUNITY): Payer: Self-pay

## 2018-04-06 ENCOUNTER — Telehealth: Payer: Self-pay | Admitting: Internal Medicine

## 2018-04-06 ENCOUNTER — Other Ambulatory Visit: Payer: Self-pay

## 2018-04-06 ENCOUNTER — Emergency Department (HOSPITAL_COMMUNITY): Payer: Medicaid Other

## 2018-04-06 DIAGNOSIS — Z79899 Other long term (current) drug therapy: Secondary | ICD-10-CM | POA: Diagnosis not present

## 2018-04-06 DIAGNOSIS — N183 Chronic kidney disease, stage 3 (moderate): Secondary | ICD-10-CM | POA: Diagnosis not present

## 2018-04-06 DIAGNOSIS — G8929 Other chronic pain: Secondary | ICD-10-CM | POA: Diagnosis not present

## 2018-04-06 DIAGNOSIS — I130 Hypertensive heart and chronic kidney disease with heart failure and stage 1 through stage 4 chronic kidney disease, or unspecified chronic kidney disease: Secondary | ICD-10-CM | POA: Insufficient documentation

## 2018-04-06 DIAGNOSIS — M545 Low back pain, unspecified: Secondary | ICD-10-CM

## 2018-04-06 DIAGNOSIS — J45909 Unspecified asthma, uncomplicated: Secondary | ICD-10-CM | POA: Insufficient documentation

## 2018-04-06 DIAGNOSIS — M25561 Pain in right knee: Secondary | ICD-10-CM | POA: Insufficient documentation

## 2018-04-06 DIAGNOSIS — M25562 Pain in left knee: Secondary | ICD-10-CM | POA: Diagnosis present

## 2018-04-06 DIAGNOSIS — Z87891 Personal history of nicotine dependence: Secondary | ICD-10-CM | POA: Insufficient documentation

## 2018-04-06 DIAGNOSIS — I509 Heart failure, unspecified: Secondary | ICD-10-CM | POA: Diagnosis not present

## 2018-04-06 LAB — CBC
HCT: 38.3 % (ref 36.0–46.0)
Hemoglobin: 11 g/dL — ABNORMAL LOW (ref 12.0–15.0)
MCH: 29.7 pg (ref 26.0–34.0)
MCHC: 28.7 g/dL — ABNORMAL LOW (ref 30.0–36.0)
MCV: 103.5 fL — ABNORMAL HIGH (ref 80.0–100.0)
Platelets: 219 10*3/uL (ref 150–400)
RBC: 3.7 MIL/uL — ABNORMAL LOW (ref 3.87–5.11)
RDW: 14.3 % (ref 11.5–15.5)
WBC: 9 10*3/uL (ref 4.0–10.5)
nRBC: 0 % (ref 0.0–0.2)

## 2018-04-06 LAB — BRAIN NATRIURETIC PEPTIDE: B Natriuretic Peptide: 12.1 pg/mL (ref 0.0–100.0)

## 2018-04-06 LAB — URINALYSIS, ROUTINE W REFLEX MICROSCOPIC
Bilirubin Urine: NEGATIVE
Glucose, UA: NEGATIVE mg/dL
Hgb urine dipstick: NEGATIVE
KETONES UR: NEGATIVE mg/dL
Nitrite: POSITIVE — AB
Protein, ur: NEGATIVE mg/dL
Specific Gravity, Urine: 1.023 (ref 1.005–1.030)
pH: 5 (ref 5.0–8.0)

## 2018-04-06 LAB — BASIC METABOLIC PANEL
Anion gap: 10 (ref 5–15)
BUN: 35 mg/dL — ABNORMAL HIGH (ref 6–20)
CO2: 27 mmol/L (ref 22–32)
Calcium: 8.4 mg/dL — ABNORMAL LOW (ref 8.9–10.3)
Chloride: 106 mmol/L (ref 98–111)
Creatinine, Ser: 2.08 mg/dL — ABNORMAL HIGH (ref 0.44–1.00)
GFR calc Af Amer: 31 mL/min — ABNORMAL LOW (ref >60)
GFR, EST NON AFRICAN AMERICAN: 26 mL/min — AB (ref >60)
Glucose, Bld: 106 mg/dL — ABNORMAL HIGH (ref 70–99)
POTASSIUM: 5.2 mmol/L — AB (ref 3.5–5.1)
Sodium: 143 mmol/L (ref 135–145)

## 2018-04-06 MED ORDER — SODIUM CHLORIDE 0.9 % IV BOLUS
1000.0000 mL | Freq: Once | INTRAVENOUS | Status: AC
  Administered 2018-04-06: 1000 mL via INTRAVENOUS

## 2018-04-06 MED ORDER — DICLOFENAC SODIUM 1 % TD GEL: 4.0000 g | Freq: Four times a day (QID) | TRANSDERMAL | 0 refills | Status: DC

## 2018-04-06 MED ORDER — OXYCODONE HCL 5 MG PO TABS
5.0000 mg | ORAL_TABLET | Freq: Once | ORAL | Status: AC
  Administered 2018-04-06: 5 mg via ORAL
  Filled 2018-04-06: qty 1

## 2018-04-06 MED ORDER — ACETAMINOPHEN 500 MG PO TABS
1000.0000 mg | ORAL_TABLET | Freq: Once | ORAL | Status: AC
  Administered 2018-04-06: 1000 mg via ORAL
  Filled 2018-04-06: qty 2

## 2018-04-06 MED ORDER — KETOROLAC TROMETHAMINE 60 MG/2ML IM SOLN
15.0000 mg | Freq: Once | INTRAMUSCULAR | Status: AC
  Administered 2018-04-06: 15 mg via INTRAMUSCULAR
  Filled 2018-04-06: qty 2

## 2018-04-06 MED FILL — ALLOPURINOL 100 MG TABLET: 100 | 30 days supply | Qty: 30 | Fill #0

## 2018-04-06 MED FILL — ISOSORBIDE MN ER 30 MG TAB: 30 | 30 days supply | Qty: 30 | Fill #0

## 2018-04-06 MED FILL — cloNIDine HCL 0.2 MG TABS: 0.2 | 30 days supply | Qty: 60 | Fill #0

## 2018-04-06 MED FILL — PANTOPRAZOLE SOD DR 40 MG T: 40 | 30 days supply | Qty: 30 | Fill #0

## 2018-04-06 MED FILL — LISINOPRIL 40 MG TABLET: 40 | 30 days supply | Qty: 30 | Fill #0

## 2018-04-06 NOTE — ED Notes (Signed)
ED Provider at bedside. 

## 2018-04-06 NOTE — ED Notes (Signed)
Patient verbalizes understanding of discharge instructions. Opportunity for questioning and answers were provided. Armband removed by staff, pt discharged from ED in personal wheelchair.   

## 2018-04-06 NOTE — Telephone Encounter (Signed)
Per Kara Vincent she has [facility] the [facility] to [facility]. They will reach out to pt

## 2018-04-06 NOTE — ED Triage Notes (Signed)
Pt here with bilateral knee pain.  Uses a wheelchair primarily.  States hurts to walk.  States PCP wanted X RAYS done. Said they put orders in but been in too much pain to have them done.

## 2018-04-06 NOTE — Telephone Encounter (Signed)
Patient came in because she wanted to check on the status of her camod seat request. Patient says she mentioned it at her last visit with Ms.Raul Del and wanted to see if that order will be put in for her.  Patient would like to be given an update on  her request. Please follow up.

## 2018-04-06 NOTE — ED Provider Notes (Signed)
[facility] EMERGENCY DEPARTMENT Provider Note   CSN: [id number] Arrival date & time: 04/06/18  1606     History   Chief Complaint Chief Complaint  Patient presents with  . Knee Pain  . Abnormal Lab    HPI Kara Vincent is a 53 y.o. female.  53 yo F with multiple complaints.  Patient states that her knees have been bothering her for some time but have worsened over the past week or so.  She denies trauma to the knees.  She has also complaining of diffuse edema.  States she has been taking Lasix but it has not been helping her that much.  She also has a couple bumps in her groin that is been there off and on for weeks.  Stated that she had a mild cough recently.  She also states that she has had some shortness of breath off and on that she attributes to too much fluid on her body.  States that she normally is on 3 L of oxygen at night due to sleep apnea.  She stated that she went to her family doctor's office and they told her that her renal function had worsened and she will come to the ED to be evaluated.  She also thinks that she has a urinary tract infection.  States that she is currently being treated for them.  Feels that it goes into her kidneys.  She has been having some low back pain that seems to come and go that is worse with movement or palpation or twisting.  The history is provided by the patient and a friend.  Knee Pain  Associated symptoms: back pain and fatigue   Associated symptoms: no fever   Abnormal Lab  Illness  Severity:  Mild Onset quality:  Sudden Duration:  2 days Timing:  Constant Progression:  Worsening Chronicity:  New Associated symptoms: fatigue and shortness of breath   Associated symptoms: no chest pain, no congestion, no fever, no headaches, no myalgias, no nausea, no rhinorrhea, no vomiting and no wheezing     Past Medical History:  Diagnosis Date  . Allergy   . Anxiety   . Arthritis   . Asthma   . Asthma   . CHF  (congestive heart failure) ([place])   . CHF (congestive heart failure) ([place])   . Chronic abdominal pain   . Chronic kidney disease   . Chronic pain    "all over"  . 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 GERD (gastroesophageal reflux disease)   . Gout   . Hidradenitis   . HLD (hyperlipidemia) 12/14/2015  . Homelessness   . Hyperlipemia   . Hypertension   . Low back pain   . Morbid obesity ([place])    uses motor wheel chair  . MRSA (methicillin resistant Staphylococcus aureus)    states about a year ago  . On home O2    3L N/C O2 continuously  . OSA (obstructive sleep apnea)    non-compliant with CPAP  . Oxygen deficiency   . Renal insufficiency   . Sleep apnea     Patient Active Problem List   Diagnosis Date Noted  . Morbid obesity ([place]) 12/01/2017  . Primary osteoarthritis of both knees 12/01/2017  . Anxiety 12/01/2017  . Impaired mobility 12/01/2017  . Essential hypertension 12/14/2015  . HLD (hyperlipidemia) 12/14/2015  . GERD (gastroesophageal reflux disease) 12/14/2015  . Gout 12/14/2015  . Darier's disease   .  Leukocytosis   . 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 Right-sided heart failure ([place]) 11/20/2014  . OSA (obstructive sleep apnea)   . CKD (chronic kidney disease) stage 3, GFR 30-59 ml/min ([facility]) 11/17/2014  . Asthma exacerbation 11/16/2014  . Venous stasis 11/16/2014  . Acute on chronic respiratory failure with hypoxia and hypercapnia ([place]) 11/16/2014  . Chronic pain 08/24/2011  . Obesity hypoventilation syndrome ([place]) 08/24/2011    Past Surgical History:  Procedure Laterality Date  . BACK SURGERY    . CESAREAN SECTION    . CESAREAN SECTION    . FRACTURE SURGERY    . TUBAL LIGATION       OB History    Gravida  0   Para  0   Term  0   Preterm  0   AB  0   Living        SAB  0   TAB  0   Ectopic  0   Multiple      Live Births               Home Medications    Prior to Admission medications   Medication Sig Start Date  End Date Taking? Authorizing Provider  acetaminophen-codeine (TYLENOL #3) 300-30 MG tablet Take 1 tablet by mouth every 8 (eight) hours as needed for moderate pain. Each prescription to last one month 03/29/18   Gildardo Pounds, NP  albuterol (PROVENTIL HFA;VENTOLIN HFA) 108 (90 Base) MCG/ACT inhaler Inhale 1-2 puffs into the lungs every 6 (six) hours as needed for wheezing or shortness of breath. 03/29/18 04/28/18  Gildardo Pounds, NP  allopurinol (ZYLOPRIM) 100 MG tablet Take 1 tablet (100 mg total) by mouth every morning. 12/02/17   Ladell Pier, MD  atorvastatin (LIPITOR) 20 MG tablet Take 1 tablet (20 mg total) by mouth every morning. 12/02/17   Ladell Pier, MD  cloNIDine (CATAPRES) 0.2 MG tablet Take 1 tablet (0.2 mg total) by mouth 2 (two) times daily. 12/01/17   Ladell Pier, MD  diclofenac sodium (VOLTAREN) 1 % GEL Apply 4 g topically 4 (four) times daily. 04/06/18   Deno Etienne, DO  fluconazole (DIFLUCAN) 150 MG tablet 1 tab po Q wk Patient not taking: Reported on 01/05/2018 12/11/17   Ladell Pier, MD  furosemide (LASIX) 40 MG tablet Take 1 tablet (40 mg total) by mouth daily. 1 tab po daily Patient not taking: Reported on 03/29/2018 12/01/17   Ladell Pier, MD  gabapentin (NEURONTIN) 100 MG capsule Take 100 mg by mouth 3 (three) times daily.    [provider]  hydrOXYzine (ATARAX/VISTARIL) 50 MG tablet Take 1 tablet (50 mg total) by mouth 3 (three) times daily as needed for anxiety or itching (anxiety and itching). 03/29/18   Gildardo Pounds, NP  indomethacin (INDOCIN SR) 75 MG CR capsule Take 75 mg by mouth 2 (two) times daily with a meal.    [provider]  ipratropium-albuterol (DUONEB) 0.5-2.5 (3) MG/3ML SOLN Take 3 mLs by nebulization every 6 (six) hours as needed (SOB). 07/16/17 10/15/17  Arrien, Jimmy Picket, MD  isosorbide mononitrate (IMDUR) 30 MG 24 hr tablet Take 1 tablet (30 mg total) by mouth daily. 12/02/17   Ladell Pier, MD    lisinopril (PRINIVIL,ZESTRIL) 40 MG tablet Take 1 tablet (40 mg total) by mouth daily. 12/02/17   Ladell Pier, MD  Misc. Devices MISC Please provide patient with a raised toilet seat that is covered under her current  insurance plan. 03/29/18   Gildardo Pounds, NP  mupirocin cream Drue Stager) 2 % Apply to affected area twice a day 01/05/18   Ladell Pier, MD  mupirocin ointment (BACTROBAN) 2 % Apply 1 application topically 2 (two) times daily. 01/12/18   Ladell Pier, MD  nystatin cream (MYCOSTATIN) APPLY TOPICALLY TO THE AFFECTED AREA(S) TWICE DAILY 12/28/17   Ladell Pier, MD  pantoprazole (PROTONIX) 40 MG tablet Take 1 tablet (40 mg total) by mouth daily. 12/02/17   Ladell Pier, MD  sertraline (ZOLOFT) 50 MG tablet Take 1 tablet (50 mg total) by mouth daily. 01/05/18   Ladell Pier, MD  solifenacin (VESICARE) 5 MG tablet Take 1 tablet (5 mg total) by mouth every morning. 12/01/17   Ladell Pier, MD  triamcinolone cream (KENALOG) 0.1 % APPLY TOPICALLY TO THE CHEST AND LEGS TWICE DAILY 12/28/17   Ladell Pier, MD  diphenhydrAMINE (SOMINEX) 25 MG tablet Take 25 mg by mouth at bedtime as needed.  06/14/11  [provider]    Family History Family History  Problem Relation Age of Onset  . Asthma Mother   . Diabetes Father   . Stroke Father   . Heart disease Father   . Arthritis Sister   . Asthma Sister   . Asthma Daughter   . Asthma Son   . Arthritis Sister   . Asthma Sister     Social History Social History   Tobacco Use  . Smoking status: Former Smoker    Types: Cigarettes    Last attempt to quit: 03/25/2003    Years since quitting: 15.0  . Smokeless tobacco: Former Systems developer    Quit date: 05/27/1978  Substance Use Topics  . Alcohol use: Yes    Alcohol/week: 0.0 standard drinks    Comment: years ago  no longer beer only  early 20's  . Drug use: Yes    Types: "Crack" cocaine, Other-see comments    Comment: years  ago      Allergies   Ibuprofen; Penicillins; Sulfa antibiotics; Adhesive [tape]; Doxycycline; Sulfamethoxazole-trimethoprim; and Zithromax [azithromycin]   Review of Systems Review of Systems  Constitutional: Positive for fatigue. Negative for chills and fever.  HENT: Negative for congestion and rhinorrhea.   Eyes: Negative for redness and visual disturbance.  Respiratory: Positive for shortness of breath. Negative for wheezing.   Cardiovascular: Positive for leg swelling. Negative for chest pain and palpitations.  Gastrointestinal: Negative for nausea and vomiting.  Genitourinary: Negative for dysuria and urgency.  Musculoskeletal: Positive for arthralgias and back pain. Negative for myalgias.  Skin: Negative for pallor and wound.  Neurological: Negative for dizziness and headaches.     Physical Exam Updated Vital Signs BP 96/68 (BP Location: Right Arm)   Pulse 72   Temp 98.6 F (37 C) (Oral)   Resp 16   LMP 03/24/2009   SpO2 98%   Physical Exam Vitals signs and nursing note reviewed.  Constitutional:      General: She is not in acute distress.    Appearance: She is well-developed. She is not diaphoretic.     Comments: Morbidly obese  HENT:     Head: Normocephalic and atraumatic.  Eyes:     Pupils: Pupils are equal, round, and reactive to light.  Neck:     Musculoskeletal: Normal range of motion and neck supple.  Cardiovascular:     Rate and Rhythm: Normal rate and regular rhythm.     Heart sounds: No murmur. No  friction rub. No gallop.   Pulmonary:     Effort: Pulmonary effort is normal.     Breath sounds: No wheezing or rales.  Abdominal:     General: There is no distension.     Palpations: Abdomen is soft.     Tenderness: There is no abdominal tenderness.  Genitourinary:   Musculoskeletal:        General: No tenderness.     Comments: No appreciable edema.  Full range of motion of bilateral knees.  Skin:    General: Skin is warm and dry.  Neurological:      Mental Status: She is alert and oriented to person, place, and time.  Psychiatric:        Behavior: Behavior normal.      ED Treatments / Results  Labs (all labs ordered are listed, but only abnormal results are displayed) Labs Reviewed  CBC - Abnormal; Notable for the following components:      Result Value   RBC 3.70 (*)    Hemoglobin 11.0 (*)    MCV 103.5 (*)    MCHC 28.7 (*)    All other components within normal limits  BASIC METABOLIC PANEL - Abnormal; Notable for the following components:   Potassium 5.2 (*)    Glucose, Bld 106 (*)    BUN 35 (*)    Creatinine, Ser 2.08 (*)    Calcium 8.4 (*)    GFR calc non Af Amer 26 (*)    GFR calc Af Amer 31 (*)    All other components within normal limits  URINALYSIS, ROUTINE W REFLEX MICROSCOPIC - Abnormal; Notable for the following components:   Nitrite POSITIVE (*)    Leukocytes, UA TRACE (*)    Bacteria, UA RARE (*)    All other components within normal limits  BRAIN NATRIURETIC PEPTIDE    EKG None  Radiology Dg Chest 2 View  Result Date: 04/06/2018 CLINICAL DATA:  Back pain and BILATERAL knee pain, cough, shortness of breath, history asthma, CHF, chronic kidney disease, hypertension, former smoker EXAM: CHEST - 2 VIEW COMPARISON:  10/15/2017 Correlation: CT chest 11/15/2014 FINDINGS: Enlargement of cardiac silhouette. Density at the RIGHT cardiophrenic angle likely reflects a prominent epicardial fat pad and is grossly unchanged since a prior CT exam. Mediastinal contours and pulmonary vascularity otherwise normal. Lungs clear. No infiltrate, pleural effusion or pneumothorax. IMPRESSION: Enlargement of cardiac silhouette. No acute abnormalities. Electronically Signed   By: Lavonia Dana M.D.   On: 04/06/2018 19:27   Dg Lumbar Spine Complete  Result Date: 04/06/2018 CLINICAL DATA:  Back pain and BILATERAL knee pain EXAM: LUMBAR SPINE - COMPLETE 4+ VIEW COMPARISON:  None FINDINGS: Five non-rib-bearing lumbar vertebra. Disc  space loss at L5-S1 question partial sacralization of L5. Vertebral body and disc space heights otherwise maintained. Scattered endplate spurs at lower thoracic and upper lumbar spine. Minimal anterolisthesis at L4-L5. No fracture, additional subluxation, bone destruction or spondylolysis. SI joints preserved. IMPRESSION: Mild degenerative changes of the lumbar spine. No acute abnormalities. Electronically Signed   By: Lavonia Dana M.D.   On: 04/06/2018 19:29   Dg Knee Complete 4 Views Left  Result Date: 04/06/2018 CLINICAL DATA:  BILATERAL knee pain, hurts to walk EXAM: LEFT KNEE - COMPLETE 4+ VIEW COMPARISON:  08/05/2004 FINDINGS: Osseous demineralization. Tricompartmental osteoarthritic changes with joint space narrowing and spur formation greatest at the medial compartment where bone-on-bone appearance is seen. No acute fracture, dislocation, or bone destruction. No knee joint effusion. Scattered soft tissue swelling  and soft tissue phleboliths. 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 Electronically Signed   By: Lavonia Dana M.D.   On: 04/06/2018 19:25   Dg Knee Complete 4 Views Right  Result Date: 04/06/2018 CLINICAL DATA:  BILATERAL knee pain, hurts to walk EXAM: RIGHT KNEE - COMPLETE 4+ VIEW COMPARISON:  07/08/2017 FINDINGS: Osseous demineralization. Tricompartmental osteoarthritic changes with joint space narrowing and spur formation greatest at lateral compartment where bone-on-bone appearance is seen. No acute fracture, dislocation, or bone destruction. No knee joint effusion. IMPRESSION: Advanced osteoarthritic changes of the RIGHT knee. Electronically Signed   By: Lavonia Dana M.D.   On: 04/06/2018 19:25    Procedures Procedures (including critical care time)  Medications Ordered in ED Medications  sodium chloride 0.9 % bolus 1,000 mL (0 mLs Intravenous Stopped 04/06/18 2011)  acetaminophen (TYLENOL) tablet 1,000 mg (1,000 mg Oral Given 04/06/18 1925)    ketorolac (TORADOL) injection 15 mg (15 mg Intramuscular Given 04/06/18 1925)  oxyCODONE (Oxy IR/ROXICODONE) immediate release tablet 5 mg (5 mg Oral Given 04/06/18 1925)     Initial Impression / Assessment and Plan / ED Course  I have reviewed the triage vital signs and the nursing notes.  Pertinent labs & imaging results that were available during my care of the patient were reviewed by me and considered in my medical decision making (see chart for details).     53 yo F with multiple complaints.  The patient's renal function has mildly worsened, and she has very mild hyperkalemia.  Will obtain an EKG.  Chest x-ray due to her shortness of breath plain film of the low back plain film of bilateral knees.  Patient asked me multiple times during the interview to be admitted to the hospital.  No concerning vitals.  No hypoxia while laying flat.  No tachycardia.  No increased work of breathing.  I suspect most of the patient's pain is musculoskeletal.  Her difficulty breathing is likely secondary to her morbid obesity.   BNP is negative.  UA with possible infection though she is currently on antibiotics.  Chest x-ray with an enlarged heart but no focal infiltrates.  Does not seem that the patient is fluid overloaded.  Suspect that she has chronic musculoskeletal pain.  Will discharge home with Voltaren gel.  PCP follow-up.  11:08 PM:  I have discussed the diagnosis/risks/treatment options with the patient and family and believe the pt to be eligible for discharge home to follow-up with PCP. We also discussed returning to the ED immediately if new or worsening sx occur. We discussed the sx which are most concerning (e.g., sudden worsening pain, fever, inability to tolerate by mouth) that necessitate immediate return. Medications administered to the patient during their visit and any new prescriptions provided to the patient are listed below.  Medications given during this visit Medications  sodium  chloride 0.9 % bolus 1,000 mL (0 mLs Intravenous Stopped 04/06/18 2011)  acetaminophen (TYLENOL) tablet 1,000 mg (1,000 mg Oral Given 04/06/18 1925)  ketorolac (TORADOL) injection 15 mg (15 mg Intramuscular Given 04/06/18 1925)  oxyCODONE (Oxy IR/ROXICODONE) immediate release tablet 5 mg (5 mg Oral Given 04/06/18 1925)      The patient appears reasonably screen and/or stabilized for discharge and I doubt any other medical condition or other [facility] requiring further screening, evaluation, or treatment in the ED at this time prior to discharge.    Final Clinical Impressions(s) / ED Diagnoses   Final diagnoses:  Chronic pain of both knees  Acute bilateral low back pain without sciatica    ED Discharge Orders         Ordered    diclofenac sodium (VOLTAREN) 1 % GEL  4 times daily     04/06/18 2214           Deno Etienne, DO 04/06/18 2308

## 2018-04-06 NOTE — ED Notes (Signed)
Lab to add on BNP.  °

## 2018-04-06 NOTE — ED Triage Notes (Signed)
Pt also stated kidney functions were elevated and PCP wanted pt evaluated for such.  States her flanks hurt and having painful urination.

## 2018-04-06 NOTE — ED Notes (Signed)
Pt reports she has been feeling "sick" for "a few weeks" reports pain in her back/flanks, increased urinary frequency, and feeling like she can't fully empty her bladder after urination. Pt also reports productive cough with yellow sputum x 2 weeks, pain when coughing, "knots in her groin," swelling in her legs, and "just feeling bad". Pt states she went to her PCP and they did lab work and informed her that her kidney function was elevated and to come to the ED for further evaluation.

## 2018-04-06 NOTE — ED Notes (Signed)
Pt ripping off leads, gown, and IV stating that she wants to be admitted to ICU, pt educated on current status and that she does not meet criteria to be admitted into the hospital or ICU. Pt yelling about not missing the bus. This nurse attempted to deescalate situation but pt is adamant that she should be admitted. EDP aware. EDP states he is discharging pt.

## 2018-04-07 ENCOUNTER — Encounter: Payer: Self-pay | Admitting: Obstetrics & Gynecology

## 2018-04-16 IMAGING — CR DG CHEST 2V
2 series · 2 of 2 positions shown · non-contrast
Comparison: None.

CLINICAL DATA: Shortness of breath and left chest pain

EXAM:
CHEST  2 VIEW

[w chest pa]
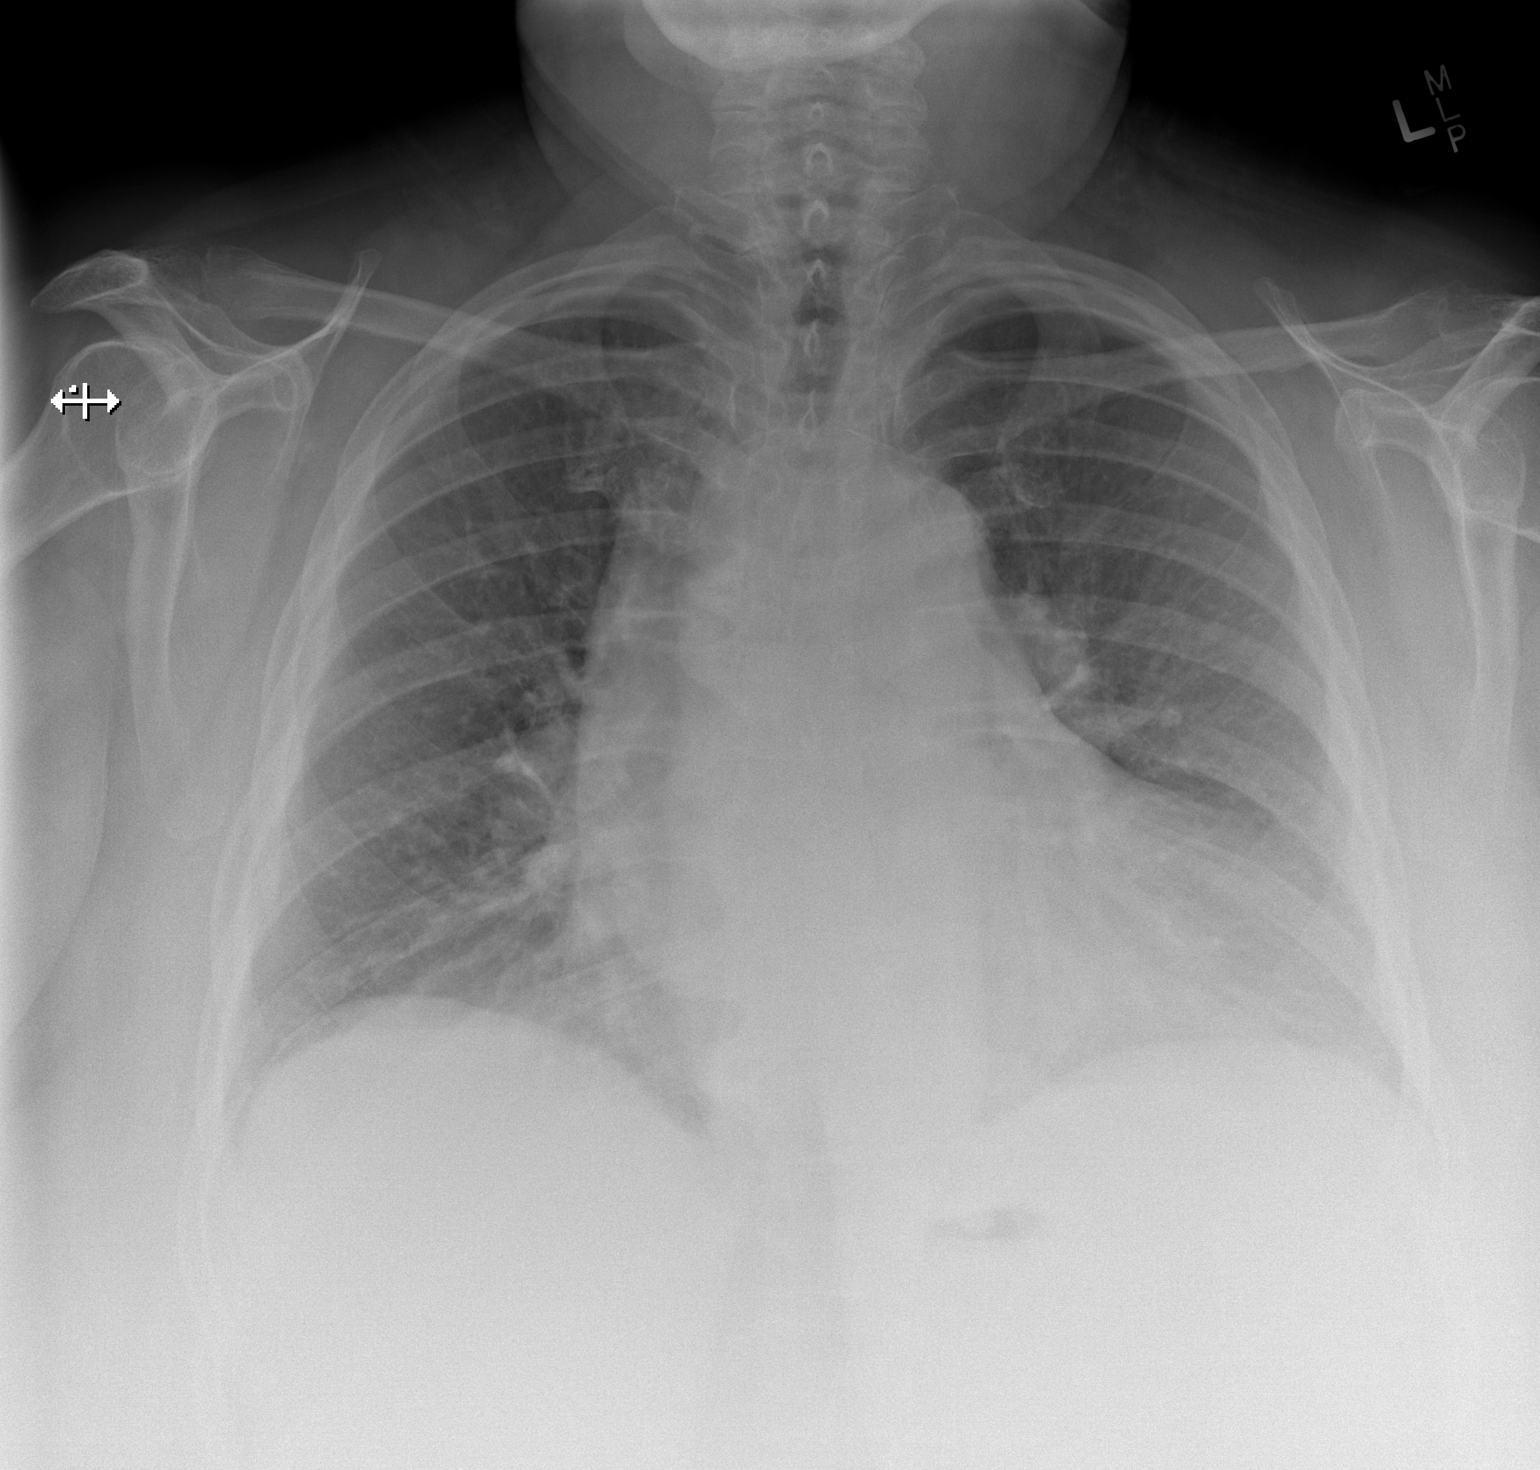

[w chest lat]
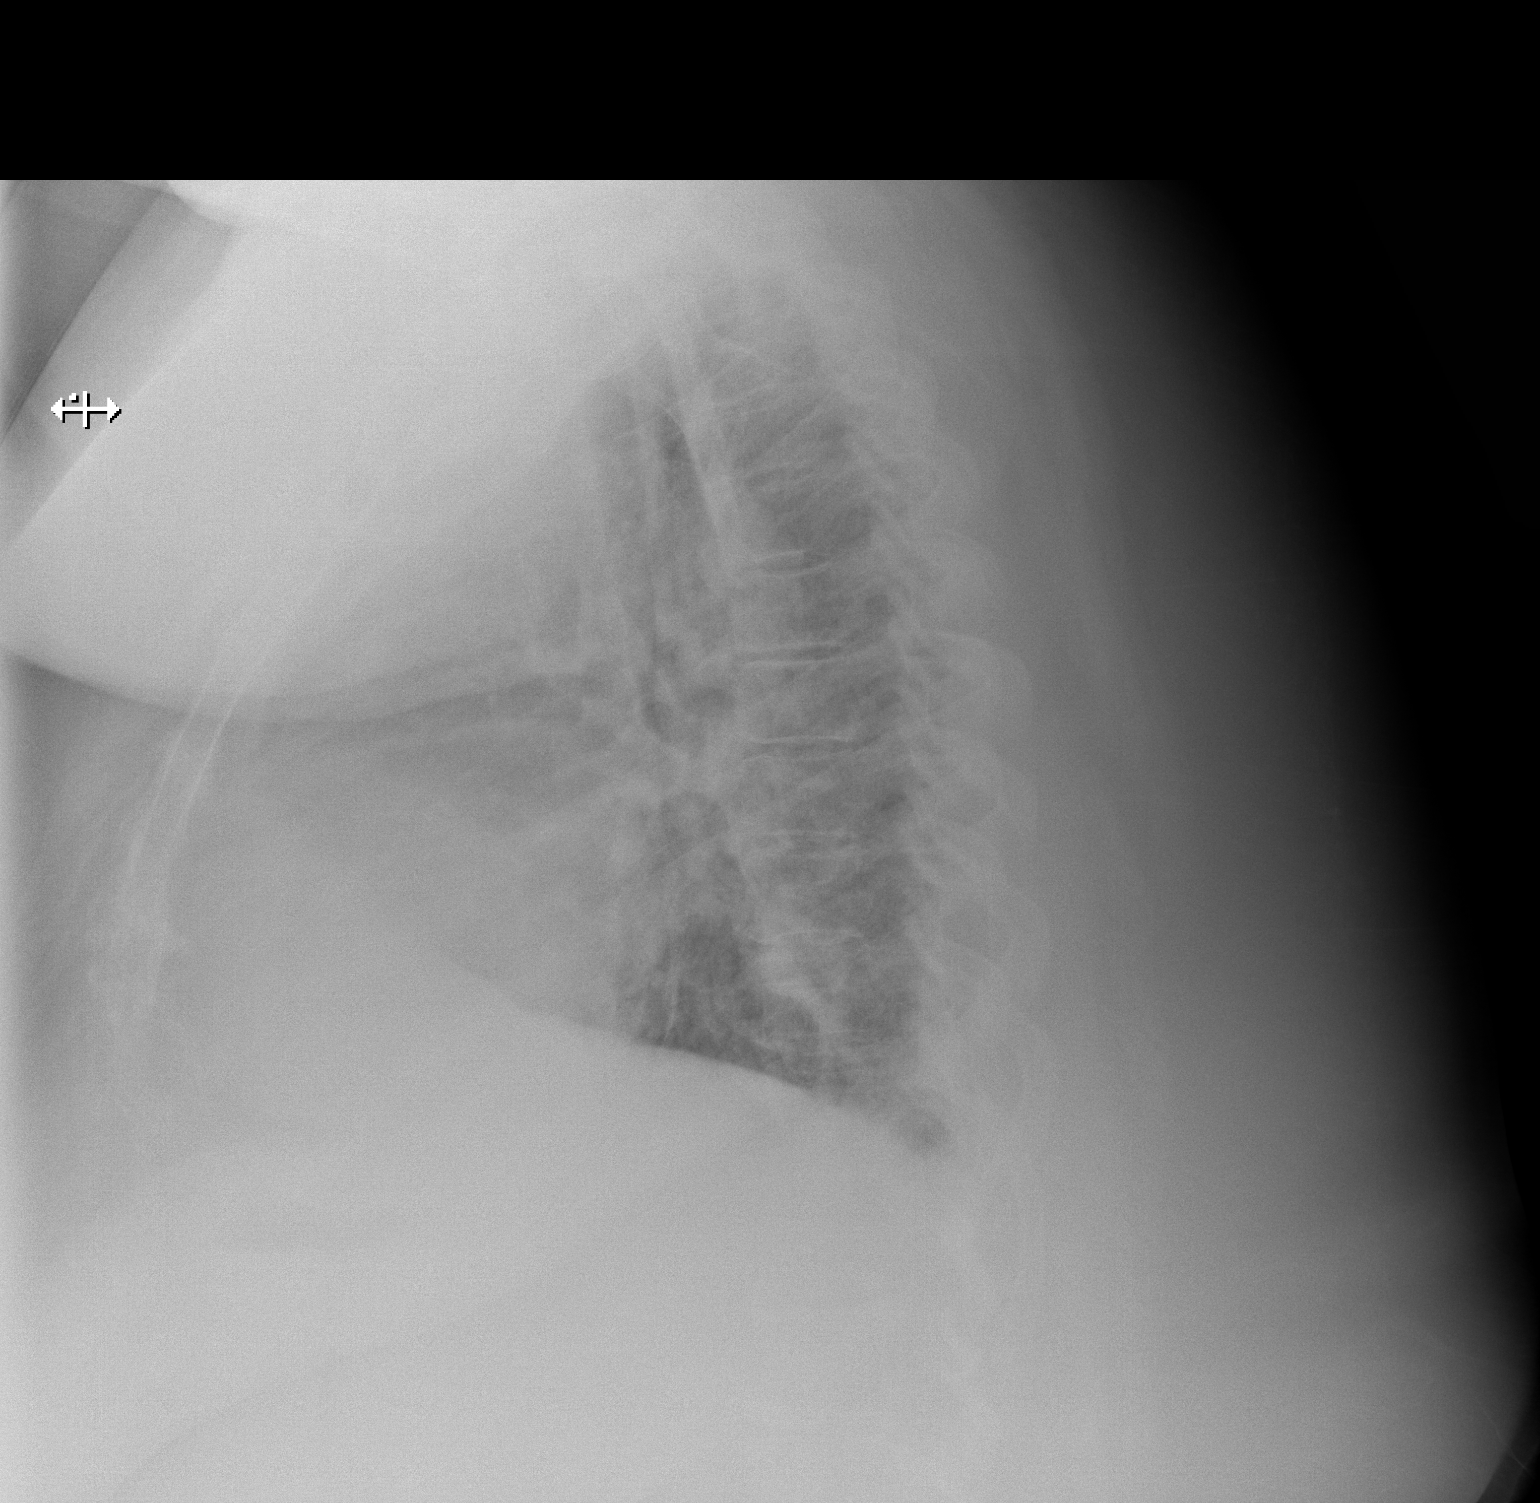

[2 of 2 positions shown; findings below may reference images not displayed]

FINDINGS: Mild enlargement of the cardiac silhouette. Smooth prominence of the
bilateral paratracheal mediastinal contour. No pneumothorax. No
pleural effusion. Lungs appear clear, with no acute consolidative
airspace disease and no pulmonary edema.
IMPRESSION: 1. Mild enlargement of the cardiac silhouette. No pulmonary edema.
No active pulmonary disease.
2. Smooth prominence of the bilateral paratracheal mediastinal
contour, cannot exclude mediastinal lymphadenopathy. Recommend
further evaluation with chest CT with IV contrast.

## 2018-05-13 ENCOUNTER — Encounter: Payer: Self-pay | Admitting: Obstetrics & Gynecology

## 2018-05-14 ENCOUNTER — Ambulatory Visit: Payer: Self-pay | Admitting: Internal Medicine

## 2018-05-21 ENCOUNTER — Encounter: Payer: Self-pay | Admitting: Internal Medicine

## 2018-05-21 ENCOUNTER — Other Ambulatory Visit (HOSPITAL_COMMUNITY)
Admission: RE | Admit: 2018-05-21 | Discharge: 2018-05-21 | Disposition: A | Discharge status: Home / Self Care | Payer: Medicaid Other | Source: Ambulatory Visit | Attending: Internal Medicine | Admitting: Internal Medicine

## 2018-05-21 ENCOUNTER — Ambulatory Visit: Payer: Self-pay | Admitting: Nurse Practitioner

## 2018-05-21 ENCOUNTER — Ambulatory Visit: Payer: Medicaid Other | Attending: Internal Medicine | Admitting: Internal Medicine

## 2018-05-21 VITALS — BP 115/76 | HR 62 | Temp 98.2°F | Resp 16

## 2018-05-21 DIAGNOSIS — F419 Anxiety disorder, unspecified: Secondary | ICD-10-CM | POA: Insufficient documentation

## 2018-05-21 DIAGNOSIS — Z886 Allergy status to analgesic agent status: Secondary | ICD-10-CM | POA: Insufficient documentation

## 2018-05-21 DIAGNOSIS — G4733 Obstructive sleep apnea (adult) (pediatric): Secondary | ICD-10-CM | POA: Diagnosis not present

## 2018-05-21 DIAGNOSIS — K219 Gastro-esophageal reflux disease without esophagitis: Secondary | ICD-10-CM | POA: Insufficient documentation

## 2018-05-21 DIAGNOSIS — I5082 Biventricular heart failure: Secondary | ICD-10-CM | POA: Insufficient documentation

## 2018-05-21 DIAGNOSIS — N761 Subacute and chronic vaginitis: Secondary | ICD-10-CM | POA: Diagnosis not present

## 2018-05-21 DIAGNOSIS — Z880 Allergy status to penicillin: Secondary | ICD-10-CM | POA: Insufficient documentation

## 2018-05-21 DIAGNOSIS — I5032 Chronic diastolic (congestive) heart failure: Secondary | ICD-10-CM | POA: Diagnosis not present

## 2018-05-21 DIAGNOSIS — Z791 Long term (current) use of non-steroidal anti-inflammatories (NSAID): Secondary | ICD-10-CM | POA: Insufficient documentation

## 2018-05-21 DIAGNOSIS — Z882 Allergy status to sulfonamides status: Secondary | ICD-10-CM | POA: Diagnosis not present

## 2018-05-21 DIAGNOSIS — G894 Chronic pain syndrome: Secondary | ICD-10-CM | POA: Insufficient documentation

## 2018-05-21 DIAGNOSIS — Q828 Other specified congenital malformations of skin: Secondary | ICD-10-CM | POA: Insufficient documentation

## 2018-05-21 DIAGNOSIS — N3941 Urge incontinence: Secondary | ICD-10-CM | POA: Diagnosis not present

## 2018-05-21 DIAGNOSIS — Z881 Allergy status to other antibiotic agents status: Secondary | ICD-10-CM | POA: Insufficient documentation

## 2018-05-21 DIAGNOSIS — I350 Nonrheumatic aortic (valve) stenosis: Secondary | ICD-10-CM | POA: Insufficient documentation

## 2018-05-21 DIAGNOSIS — N184 Chronic kidney disease, stage 4 (severe): Secondary | ICD-10-CM | POA: Insufficient documentation

## 2018-05-21 DIAGNOSIS — B373 Candidiasis of vulva and vagina: Secondary | ICD-10-CM | POA: Diagnosis not present

## 2018-05-21 DIAGNOSIS — B3731 Acute candidiasis of vulva and vagina: Secondary | ICD-10-CM

## 2018-05-21 DIAGNOSIS — Z87891 Personal history of nicotine dependence: Secondary | ICD-10-CM | POA: Insufficient documentation

## 2018-05-21 DIAGNOSIS — I130 Hypertensive heart and chronic kidney disease with heart failure and stage 1 through stage 4 chronic kidney disease, or unspecified chronic kidney disease: Secondary | ICD-10-CM | POA: Insufficient documentation

## 2018-05-21 DIAGNOSIS — E785 Hyperlipidemia, unspecified: Secondary | ICD-10-CM | POA: Insufficient documentation

## 2018-05-21 DIAGNOSIS — Z8249 Family history of ischemic heart disease and other diseases of the circulatory system: Secondary | ICD-10-CM | POA: Insufficient documentation

## 2018-05-21 DIAGNOSIS — M109 Gout, unspecified: Secondary | ICD-10-CM | POA: Insufficient documentation

## 2018-05-21 DIAGNOSIS — Z888 Allergy status to other drugs, medicaments and biological substances status: Secondary | ICD-10-CM | POA: Insufficient documentation

## 2018-05-21 DIAGNOSIS — M170 Bilateral primary osteoarthritis of knee: Secondary | ICD-10-CM | POA: Diagnosis not present

## 2018-05-21 DIAGNOSIS — M7989 Other specified soft tissue disorders: Secondary | ICD-10-CM | POA: Diagnosis present

## 2018-05-21 DIAGNOSIS — Z79899 Other long term (current) drug therapy: Secondary | ICD-10-CM | POA: Insufficient documentation

## 2018-05-21 DIAGNOSIS — I10 Essential (primary) hypertension: Secondary | ICD-10-CM

## 2018-05-21 MED ORDER — ACETAMINOPHEN-CODEINE #3 300-30 MG PO TABS: 1.0000 | ORAL_TABLET | Freq: Three times a day (TID) | ORAL | 0 refills | Status: DC | PRN

## 2018-05-21 MED ORDER — FLUCONAZOLE 150 MG PO TABS: 150.0000 mg | ORAL_TABLET | Freq: Once | ORAL | 0 refills | Status: AC

## 2018-05-21 MED ORDER — CLONIDINE HCL 0.2 MG PO TABS: 0.2000 mg | ORAL_TABLET | Freq: Two times a day (BID) | ORAL | 6 refills | Status: DC

## 2018-05-21 MED FILL — FLUCONAZOLE 150 MG TABS: 150 | 1 days supply | Qty: 1 | Fill #0

## 2018-05-21 MED FILL — ACETAMINOPHEN/COD #3 TABLET: 300-30 | 23 days supply | Qty: 70 | Fill #0

## 2018-05-21 NOTE — Progress Notes (Signed)
Patient ID: Kara Vincent, female    DOB: October 05, 1965  MRN: [id number]  CC: Leg Swelling and Foot Swelling   Subjective: Kara Vincent is a 53 y.o. female who presents for chronic ds management Her concerns today include:  Patient with history of asthma, obstructive sleep apnea,HTN,diastolic heart failure, aortic stenosis and CKD stage III, anxiety, morbid obesity, chronic pain syndrome, gout,urge incontinence  OSA: never received call for sleep study.  Cell phone is not working.  She is currently staying at a motel and receives calls through the front desk to put through to her room.  HTN/ diastolic CHF:  C/o swelling in feet and leg.  Limits salt in foods Reports compliance with Furosemide SOB "when I get up and move around.  Walks to bathroom with her cane.  She has not had any falls.  +SOB when laying down at nights.  Sleeps on 1 pillow She requests refill on clonidine.  Still has lumps "near my vagina parts."  Gets recurrent yeast infection with itching.  I have prescribed Diflucan for her in the past to take weekly for 2 to 3 weeks.  She is not sure if she has a vaginal discharge.  Obesity:  Eating smaller portions.  She gets around in her mobility chair.  She does limited walking with a cane.  Not very mobile due to significant arthritis in the knees.  She is requesting refill on Tylenol#3 which she takes as needed.  Denies any significant drowsiness or constipation with the medication. I note Indocin on her med list.  She is wanting a refill.  I told patient that she should not be taking this medication due to her kidney function.  As a matter fact her most recent BMP shows progression of GFR from stage III to stage IV CKD  Patient Active Problem List   Diagnosis Date Noted  . Morbid obesity ([place]) 12/01/2017  . Primary osteoarthritis of both knees 12/01/2017  . Anxiety 12/01/2017  . Impaired mobility 12/01/2017  . Essential hypertension 12/14/2015  . HLD  (hyperlipidemia) 12/14/2015  . GERD (gastroesophageal reflux disease) 12/14/2015  . Gout 12/14/2015  . Darier's disease   . Leukocytosis   . 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 Right-sided heart failure ([place]) 11/20/2014  . OSA (obstructive sleep apnea)   . CKD (chronic kidney disease) stage 3, GFR 30-59 ml/min (HCC) 11/17/2014  . Asthma exacerbation 11/16/2014  . Venous stasis 11/16/2014  . Acute on chronic respiratory failure with hypoxia and hypercapnia ([place]) 11/16/2014  . Chronic pain 08/24/2011  . Obesity hypoventilation syndrome ([place]) 08/24/2011     Current Outpatient Medications on File Prior to Visit  Medication Sig Dispense Refill  . albuterol (PROVENTIL HFA;VENTOLIN HFA) 108 (90 Base) MCG/ACT inhaler Inhale 1-2 puffs into the lungs every 6 (six) hours as needed for wheezing or shortness of breath. 1 Inhaler 6  . allopurinol (ZYLOPRIM) 100 MG tablet Take 1 tablet (100 mg total) by mouth every morning. 30 tablet 6  . atorvastatin (LIPITOR) 20 MG tablet Take 1 tablet (20 mg total) by mouth every morning. 30 tablet 6  . diclofenac sodium (VOLTAREN) 1 % GEL Apply 4 g topically 4 (four) times daily. 100 g 0  . furosemide (LASIX) 40 MG tablet Take 1 tablet (40 mg total) by mouth daily. 1 tab po daily (Patient not taking: Reported on 03/29/2018) 60 tablet 3  . gabapentin (NEURONTIN) 100 MG capsule Take 100 mg by mouth 3 (three) times daily.    . hydrOXYzine (  ATARAX/VISTARIL) 50 MG tablet Take 1 tablet (50 mg total) by mouth 3 (three) times daily as needed for anxiety or itching (anxiety and itching). 60 tablet 1  . ipratropium-albuterol (DUONEB) 0.5-2.5 (3) MG/3ML SOLN Take 3 mLs by nebulization every 6 (six) hours as needed (SOB). 3 mL 0  . isosorbide mononitrate (IMDUR) 30 MG 24 hr tablet Take 1 tablet (30 mg total) by mouth daily. 30 tablet 6  . lisinopril (PRINIVIL,ZESTRIL) 40 MG tablet Take 1 tablet (40 mg total) by mouth daily. 20 tablet 6  . Misc. Devices MISC Please provide patient with a  raised toilet seat that is covered under her current insurance plan. 1 each 0  . mupirocin cream (BACTROBAN) 2 % Apply to affected area twice a day 15 g 0  . nystatin cream (MYCOSTATIN) APPLY TOPICALLY TO THE AFFECTED AREA(S) TWICE DAILY (Patient not taking: Reported on 05/21/2018) 60 g 0  . pantoprazole (PROTONIX) 40 MG tablet Take 1 tablet (40 mg total) by mouth daily. 30 tablet 5  . sertraline (ZOLOFT) 50 MG tablet Take 1 tablet (50 mg total) by mouth daily. 30 tablet 3  . solifenacin (VESICARE) 5 MG tablet Take 1 tablet (5 mg total) by mouth every morning. 30 tablet 6  . triamcinolone cream (KENALOG) 0.1 % APPLY TOPICALLY TO THE CHEST AND LEGS TWICE DAILY (Patient not taking: Reported on 05/21/2018) 454 g 0  . [DISCONTINUED] diphenhydrAMINE (SOMINEX) 25 MG tablet Take 25 mg by mouth at bedtime as needed.     No current facility-administered medications on file prior to visit.     Allergies  Allergen Reactions  . Ibuprofen Hives, Shortness Of Breath, Palpitations and Rash    Has tolerated toradol 12/2013 inpatient  . Penicillins Anaphylaxis, Hives and Rash    Has patient had a PCN reaction causing immediate rash, facial/tongue/throat swelling, SOB or lightheadedness with hypotension: Yes Has patient had a PCN reaction causing severe rash involving mucus membranes or skin necrosis: Yes Has patient had a PCN reaction that required hospitalization Unknown Has patient had a PCN reaction occurring within the last 10 years: Unknown If all of the above answers are "NO", then may proceed with Cephalosporin use.   . Sulfa Antibiotics Hives, Shortness Of Breath, Rash and Cough  . Adhesive [Tape] Rash  . Doxycycline Hives, Swelling and Rash  . Sulfamethoxazole-Trimethoprim Rash  . Zithromax [Azithromycin] Hives, Swelling and Rash    Social History   Socioeconomic History  . Marital status: Single    Spouse name: Not on file  . Number of children: Not on file  . Years of education: Not on  file  . Highest education level: Not on file  Occupational History  . Not on file  Social Needs  . Financial resource strain: Not on file  . Food insecurity:    Worry: Not on file    Inability: Not on file  . Transportation needs:    Medical: Not on file    Non-medical: Not on file  Tobacco Use  . Smoking status: Former Smoker    Types: Cigarettes    Last attempt to quit: 03/25/2003    Years since quitting: 15.1  . Smokeless tobacco: Former Systems developer    Quit date: 05/27/1978  Substance and Sexual Activity  . Alcohol use: Yes    Alcohol/week: 0.0 standard drinks    Comment: years ago  no longer beer only  early 20's  . Drug use: Yes    Types: "Crack" cocaine, Other-see comments  Comment: years  ago  . Sexual activity: Yes    Birth control/protection: Other-see comments, None, Abstinence  Lifestyle  . Physical activity:    Days per week: Not on file    Minutes per session: Not on file  . Stress: Not on file  Relationships  . Social connections:    Talks on phone: Not on file    Gets together: Not on file    Attends religious service: Not on file    Active member of club or organization: Not on file    Attends meetings of clubs or organizations: Not on file    Relationship status: Not on file  . Intimate partner violence:    Fear of current or ex partner: Not on file    Emotionally abused: Not on file    Physically abused: Not on file    Forced sexual activity: Not on file  Other Topics Concern  . Not on file  Social History Narrative   ** Merged History Encounter **       Patient has exhausted [facility] financial resources. She does have access to [employer] and [employer]. She should be redirected to purse disability with her attorney and reapply of Medicaid    Family History  Problem Relation Age of Onset  . Asthma Mother   . Diabetes Father   . Stroke Father   . Heart disease Father   . Arthritis Sister   . Asthma Sister   . Asthma Daughter   . Asthma Son     . Arthritis Sister   . Asthma Sister     Past Surgical History:  Procedure Laterality Date  . BACK SURGERY    . CESAREAN SECTION    . CESAREAN SECTION    . FRACTURE SURGERY    . TUBAL LIGATION      ROS: Review of Systems Negative except as stated above  PHYSICAL EXAM: BP 115/76   Pulse 62   Temp 98.2 F (36.8 C) (Oral)   Resp 16   LMP 03/24/2009   SpO2 95%   Wt Readings from Last 3 Encounters:  01/05/18 (!) 385 lb 6.4 oz (174.8 kg)  12/01/17 (!) 386 lb (175.1 kg)  10/15/17 (!) 400 lb (181.4 kg)   Physical Exam  General appearance - alert, well appearing, morbidly obese Caucasian female in NAD and in no distress.  Patient sitting in mobility chair Mental status - normal mood, behavior, speech, dress, motor activity, and thought processes Mouth - mucous membranes moist, pharynx normal without lesions Chest - clear to auscultation, no wheezes, rales or rhonchi, symmetric air entry Heart - normal rate, regular rhythm, normal S1, S2, no murmurs, rubs, clicks or gallops Pelvic -patient declines pelvic exam. Extremities -she does not have any edema in the lower extremities at this time   CMP Latest Ref Rng & Units 04/06/2018 03/29/2018 12/01/2017  Glucose 70 - 99 mg/dL 106(H) 77 88  BUN 6 - 20 mg/dL 35(H) 35(H) 30(H)  Creatinine 0.44 - 1.00 mg/dL 2.08(H) 1.89(H) 1.87(H)  Sodium 135 - 145 mmol/L 143 144 142  Potassium 3.5 - 5.1 mmol/L 5.2(H) 5.3(H) 5.0  Chloride 98 - 111 mmol/L 106 106 97  CO2 22 - 32 mmol/L 27 25 27   Calcium 8.9 - 10.3 mg/dL 8.4(L) 8.3(L) 9.2  Total Protein 6.0 - 8.5 g/dL - 6.6 7.1  Total Bilirubin 0.0 - 1.2 mg/dL - <0.2 0.3  Alkaline Phos 39 - 117 IU/L - 61 68  AST 0 - 40  IU/L - 9 11  ALT 0 - 32 IU/L - 7 8   Lipid Panel     Component Value Date/Time   CHOL 190 12/01/2017 1541   TRIG 261 (H) 12/01/2017 1541   HDL 37 (L) 12/01/2017 1541   CHOLHDL 5.1 (H) 12/01/2017 1541   CHOLHDL 4.9 12/14/2015 0303   VLDL 26 12/14/2015 0303   LDLCALC 101 (H)  12/01/2017 1541    CBC    Component Value Date/Time   WBC 9.0 04/06/2018 1653   RBC 3.70 (L) 04/06/2018 1653   HGB 11.0 (L) 04/06/2018 1653   HGB 12.8 12/01/2017 1541   HCT 38.3 04/06/2018 1653   HCT 40.7 12/01/2017 1541   PLT 219 04/06/2018 1653   PLT 217 12/01/2017 1541   MCV 103.5 (H) 04/06/2018 1653   MCV 93 12/01/2017 1541   MCH 29.7 04/06/2018 1653   MCHC 28.7 (L) 04/06/2018 1653   RDW 14.3 04/06/2018 1653   RDW 14.1 12/01/2017 1541   LYMPHSABS 2.1 10/15/2017 2320   MONOABS 0.4 10/15/2017 2320   EOSABS 0.3 10/15/2017 2320   BASOSABS 0.0 10/15/2017 2320    ASSESSMENT AND PLAN: 1. OSA (obstructive sleep apnea) Patient given the phone number for [facility] long sleep lab to call and request an appointment for her sleep study since she does not have a reliable phone number.  2. Chronic vaginitis - Cervicovaginal ancillary only We will try to do a pelvic exam on her on next visit in the exam room that has an adjustable height exam table 3. Vaginal yeast infection - fluconazole (DIFLUCAN) 150 MG tablet; Take 1 tablet (150 mg total) by mouth once for 1 dose.  Dispense: 1 tablet; Refill: 0  4. Primary osteoarthritis of both knees NCCSRS reviewed Controlled substance prescribing agreement up-to-date. - acetaminophen-codeine (TYLENOL #3) 300-30 MG tablet; Take 1 tablet by mouth every 8 (eight) hours as needed for moderate pain. Each prescription to last one month  Dispense: 70 tablet; Refill: 0  5. CKD (chronic kidney disease) stage 4, GFR 15-29 ml/min (HCC) Advised to discontinue taking Indocin.  We will recheck BMP.  If still at a stage IV we will need to refer her to a nephrologist - Basic Metabolic Panel  6. Morbid obesity ([place]) Encouraged her to continue with smaller portion sizes.  Encouraged her to try to walk every day if only for 10 minutes.  Her weight has decreased some over the past year  7. Chronic diastolic heart failure (HCC) Continue furosemide and lisinopril.   No edema on exam today. 8. Essential hypertension - cloNIDine (CATAPRES) 0.2 MG tablet; Take 1 tablet (0.2 mg total) by mouth 2 (two) times daily.  Dispense: 60 tablet; Refill: 6   Patient was given the opportunity to ask questions.  Patient verbalized understanding of the plan and was able to repeat key elements of the plan.   Orders Placed This Encounter  Procedures  . Basic Metabolic Panel     Requested Prescriptions   Signed Prescriptions Disp Refills  . fluconazole (DIFLUCAN) 150 MG tablet 1 tablet 0    Sig: Take 1 tablet (150 mg total) by mouth once for 1 dose.  [name] acetaminophen-codeine (TYLENOL #3) 300-30 MG tablet 70 tablet 0    Sig: Take 1 tablet by mouth every 8 (eight) hours as needed for moderate pain. Each prescription to last one month  . cloNIDine (CATAPRES) 0.2 MG tablet 60 tablet 6    Sig: Take 1 tablet (0.2 mg total) by mouth 2 (  two) times daily.    Return in about 1 month (around 06/19/2018) for pap.  Karle Plumber, MD, FACP

## 2018-05-21 NOTE — Progress Notes (Signed)
Pt states she is still having issues with the yeast

## 2018-05-21 NOTE — Patient Instructions (Signed)
Stop Indocin.  This can make your kidney function worse.  Please call [place] sleep study lab to schedule your appointment for the sleep study.

## 2018-05-22 LAB — BASIC METABOLIC PANEL
BUN/Creatinine Ratio: 14 (ref 9–23)
BUN: 19 mg/dL (ref 6–24)
CALCIUM: 9.1 mg/dL (ref 8.7–10.2)
CHLORIDE: 100 mmol/L (ref 96–106)
CO2: 26 mmol/L (ref 20–29)
Creatinine, Ser: 1.39 mg/dL — ABNORMAL HIGH (ref 0.57–1.00)
GFR calc Af Amer: 50 mL/min/{1.73_m2} — ABNORMAL LOW (ref >59)
GFR calc non Af Amer: 43 mL/min/{1.73_m2} — ABNORMAL LOW (ref >59)
Glucose: 85 mg/dL (ref 65–99)
Potassium: 3.9 mmol/L (ref 3.5–5.2)
Sodium: 144 mmol/L (ref 134–144)

## 2018-05-24 ENCOUNTER — Telehealth: Payer: Self-pay

## 2018-05-24 NOTE — Telephone Encounter (Signed)
Contacted pt to go over lab results pt didn't answer and was unable to lvm due to call can't be completed at this time

## 2018-05-25 LAB — CERVICOVAGINAL ANCILLARY ONLY
Bacterial vaginitis: NEGATIVE
Candida vaginitis: POSITIVE — AB
Chlamydia: NEGATIVE
Neisseria Gonorrhea: NEGATIVE
Trichomonas: NEGATIVE

## 2018-06-03 ENCOUNTER — Telehealth: Payer: Self-pay

## 2018-06-03 NOTE — Telephone Encounter (Signed)
Contacted pt to go over lab results. Was unable to reach pt due to phone going to a busy signal

## 2018-06-18 ENCOUNTER — Other Ambulatory Visit: Payer: Self-pay | Admitting: Nurse Practitioner

## 2018-06-18 DIAGNOSIS — F419 Anxiety disorder, unspecified: Secondary | ICD-10-CM

## 2018-06-29 ENCOUNTER — Ambulatory Visit: Payer: Self-pay | Admitting: Internal Medicine

## 2018-07-01 IMAGING — CR DG CHEST 2V
3 series · 3 of 3 positions shown · non-contrast
Comparison: None.

CLINICAL DATA: Acute onset of shortness of breath and bilateral leg
swelling. Wheezing. Initial encounter.

EXAM:
CHEST  2 VIEW

[w chest lat (1 of 2)]
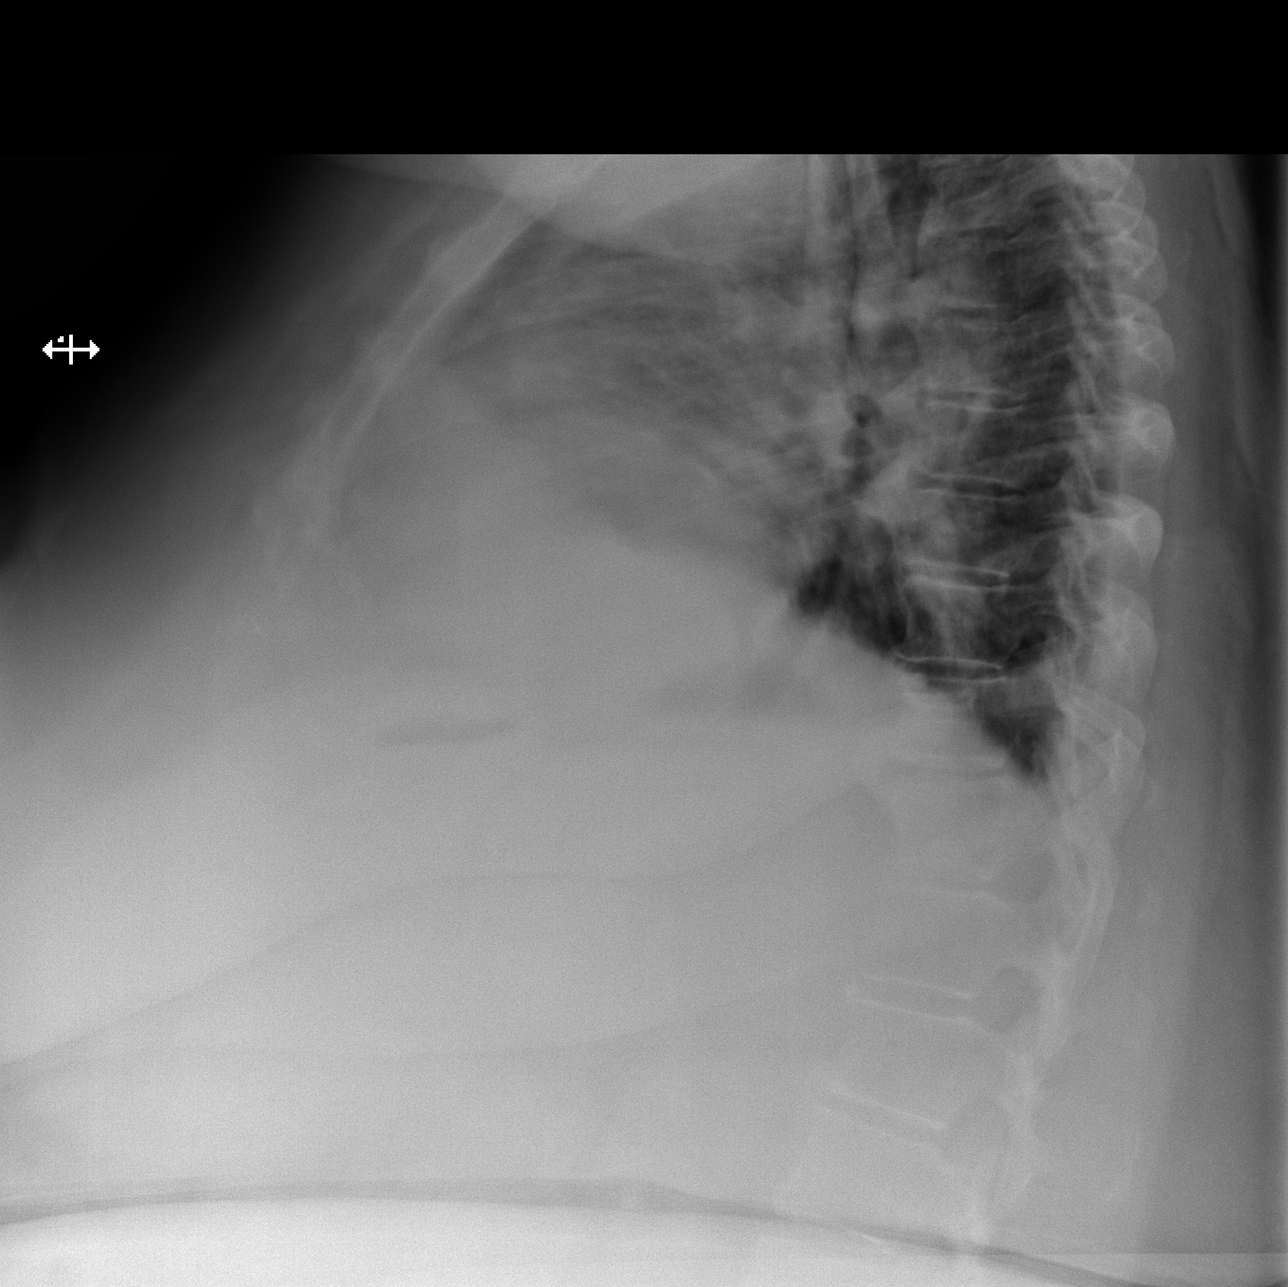

[w chest lat (2 of 2)]
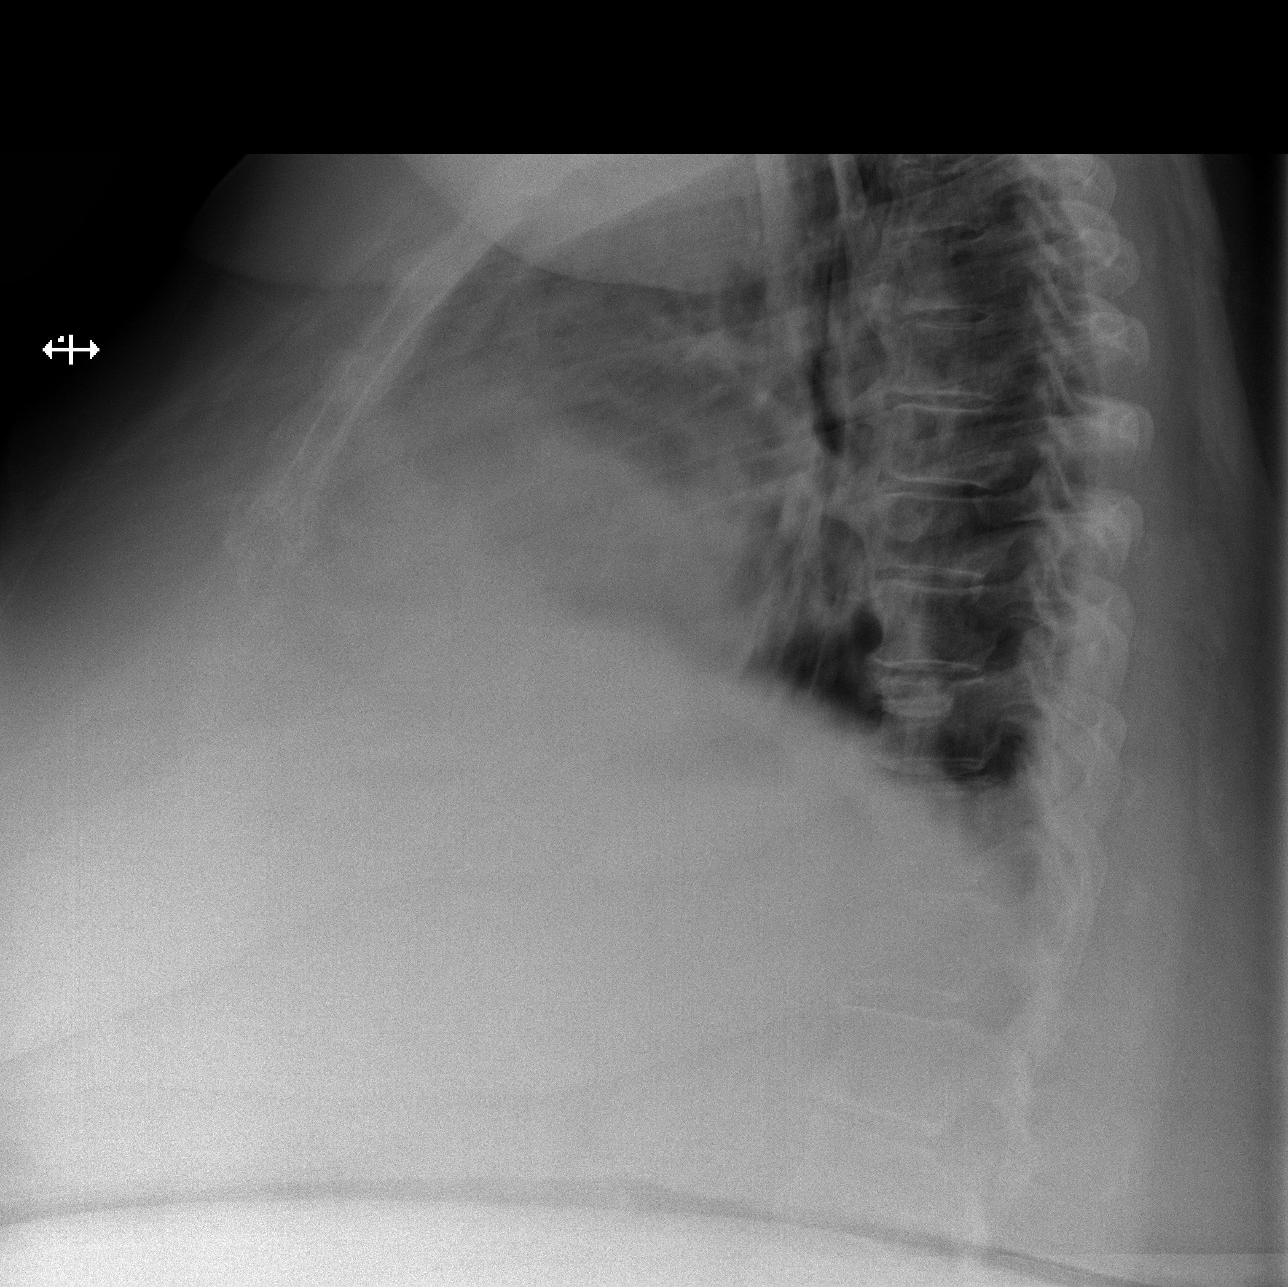

[x chest ap]
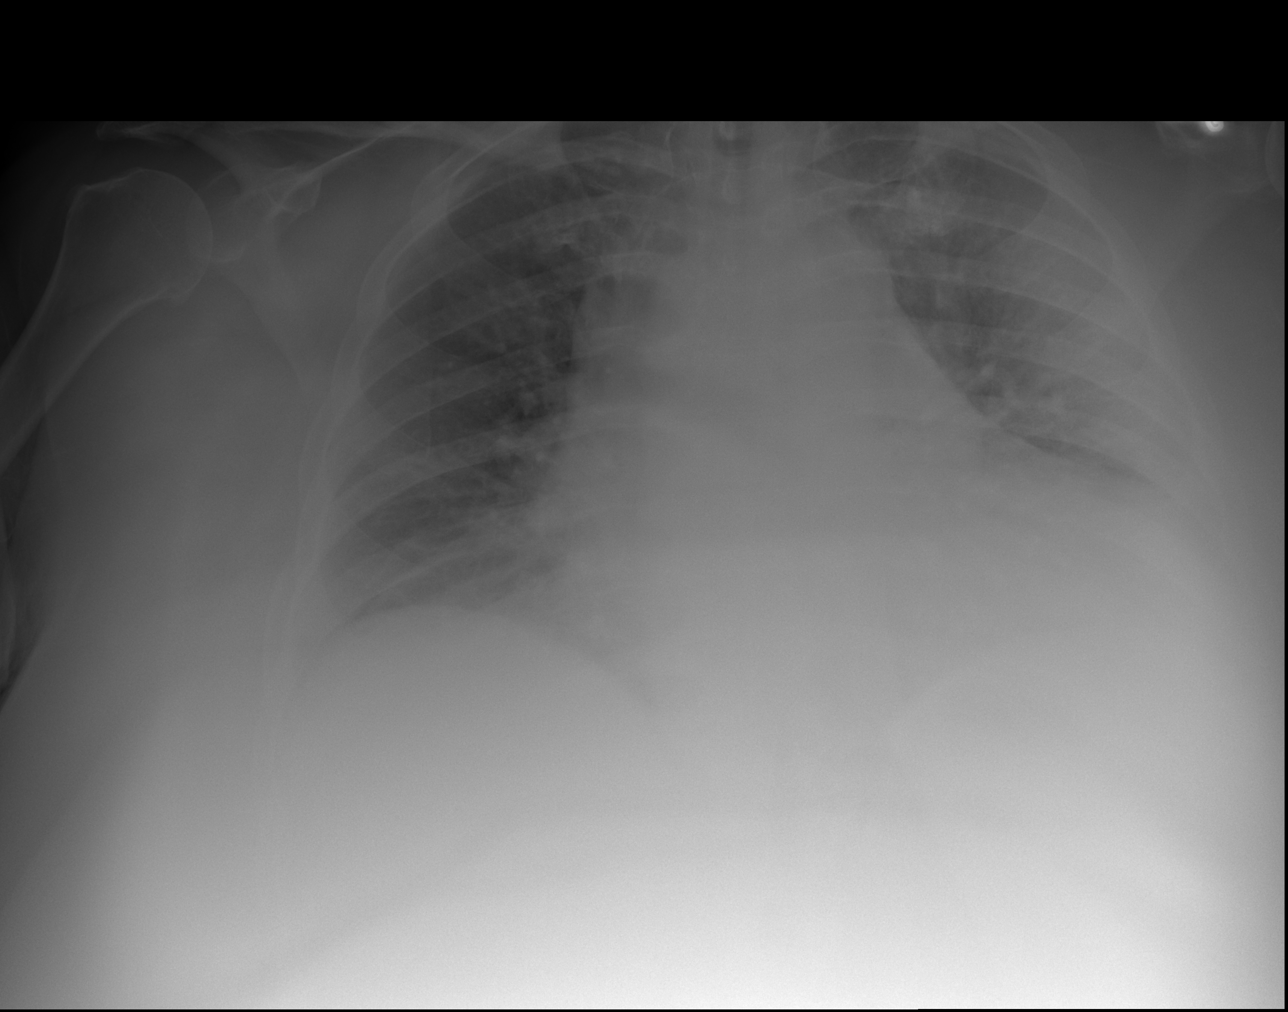

[3 of 3 positions shown; findings below may reference images not displayed]

FINDINGS: The lungs are well-aerated. Vascular congestion is noted. A small
left pleural effusion is seen. Increased interstitial markings raise
concern for pulmonary edema. There is no evidence of pneumothorax.

The heart is enlarged.  No acute osseous abnormalities are seen.
IMPRESSION: Vascular congestion and cardiomegaly. Small left pleural effusion
seen. Increased interstitial markings raise concern for pulmonary
edema.

## 2018-07-04 IMAGING — CT CT HEAD W/O CM
1 of 2 series · 16 of 30 positions shown, 20 images · non-contrast
Comparison: None.

CLINICAL DATA: Left-sided headache. Dizziness. Some blurred vision.

EXAM:
CT HEAD WITHOUT CONTRAST
TECHNIQUE: Contiguous axial images were obtained from the base of the skull
through the vertex without intravenous contrast.

[Series 602: <mpr thick range> · axial · 0.45mm/px · z∈[-192,-35]mm · 16 of 60 slices shown, 20 images]
[im 4/60  brain]
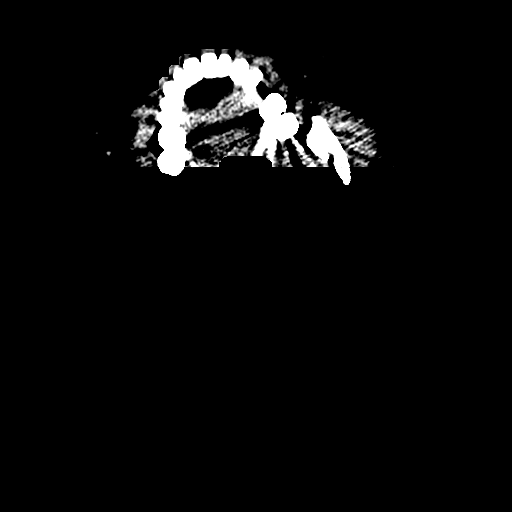
[im 4/60  bone]
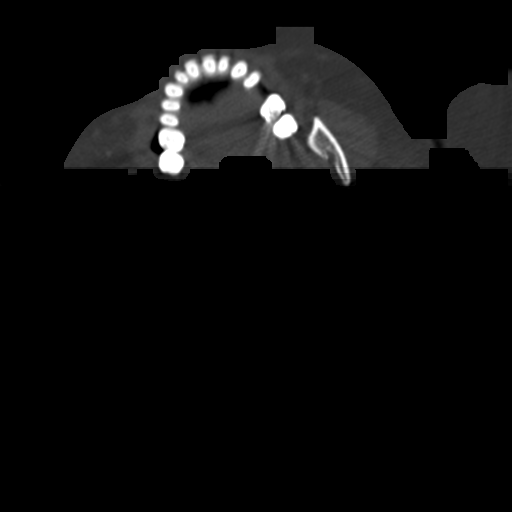
[im 7/60  brain]
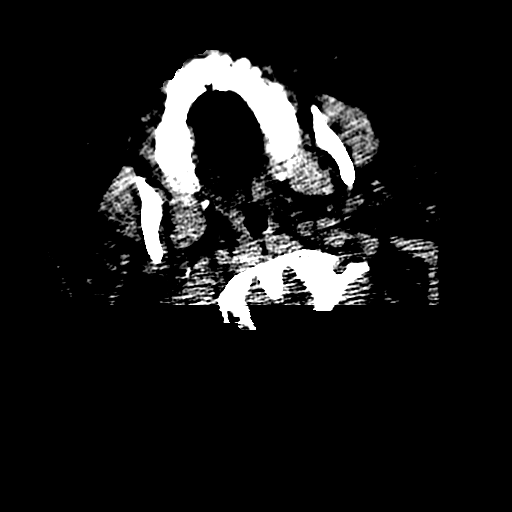
[im 10/60  brain]
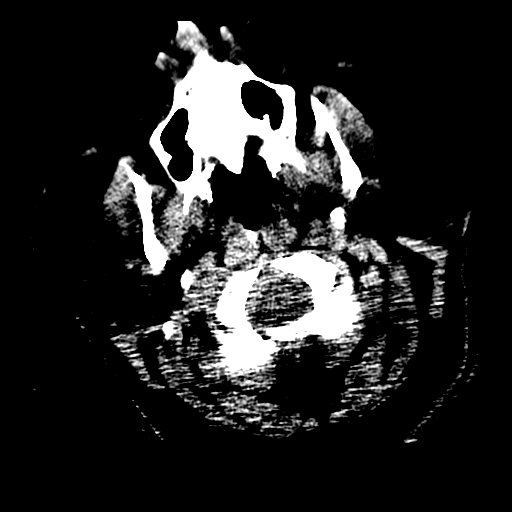
[im 14/60  brain]
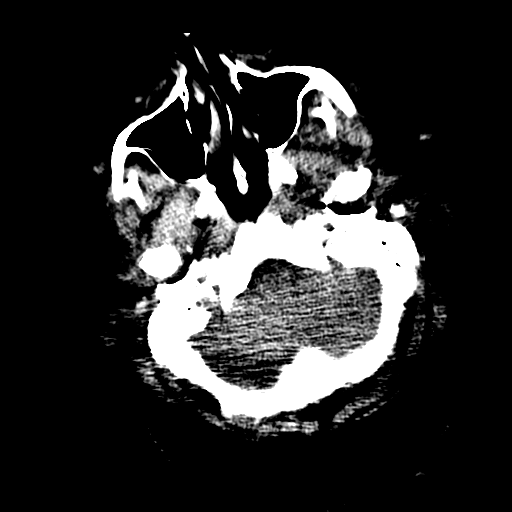
[im 17/60  brain]
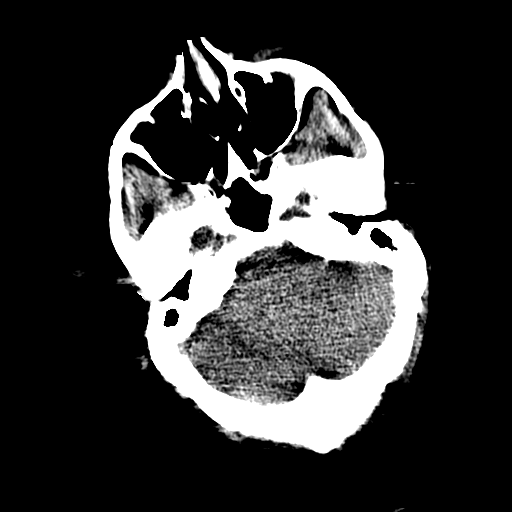
[im 17/60  bone]
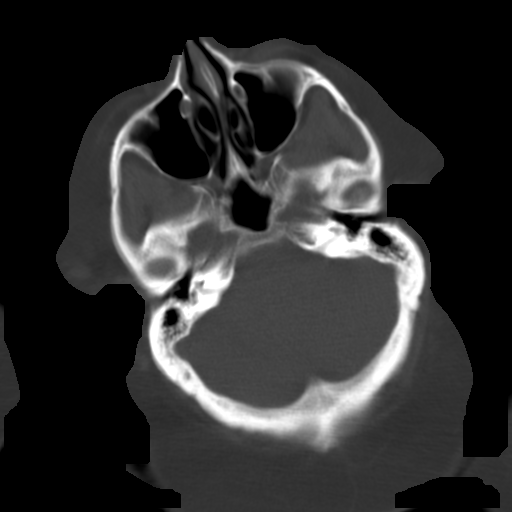
[im 20/60  brain]
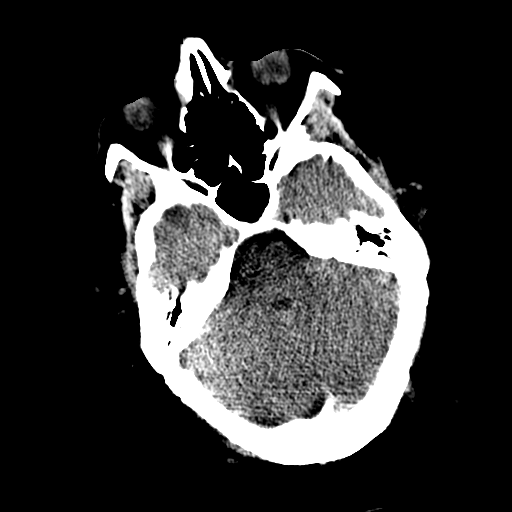
[im 23/60  brain]
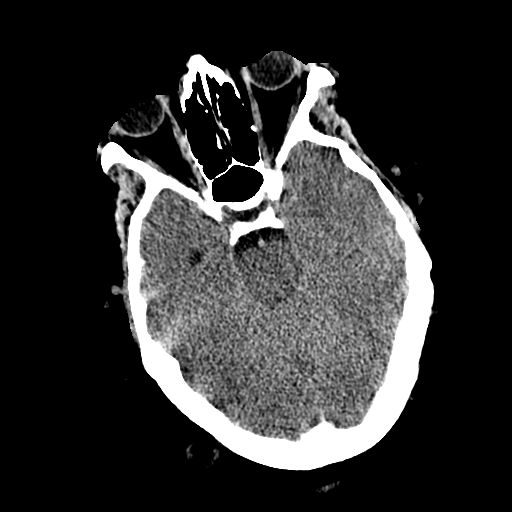
[im 27/60  brain]
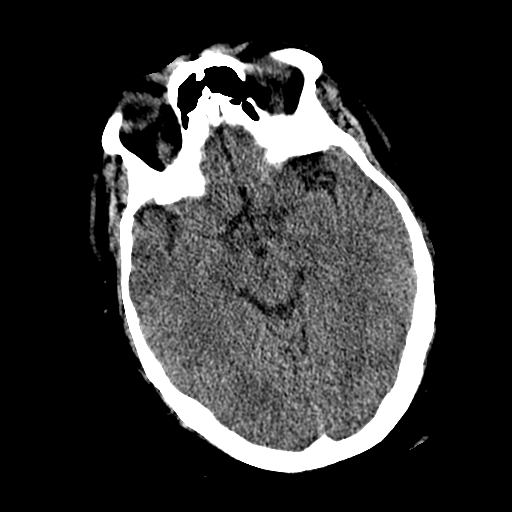
[im 33/60  brain]
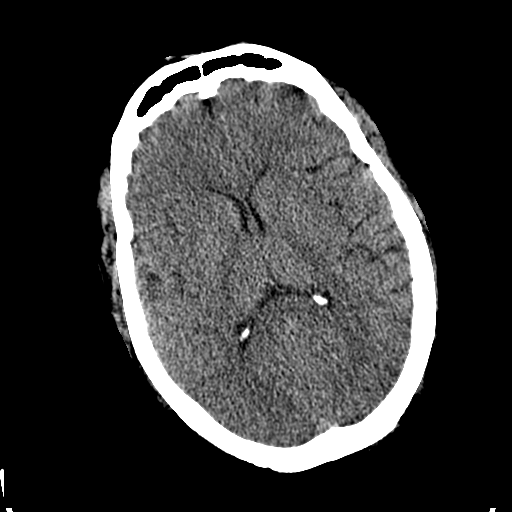
[im 33/60  bone]
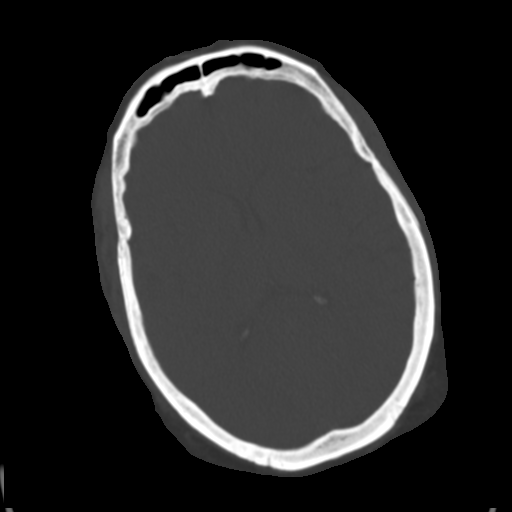
[im 37/60  brain]
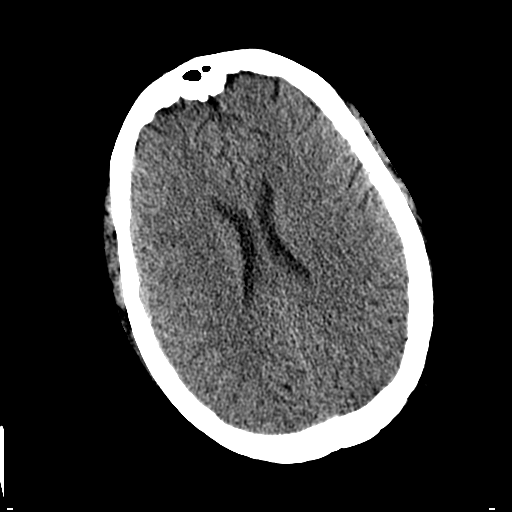
[im 40/60  brain]
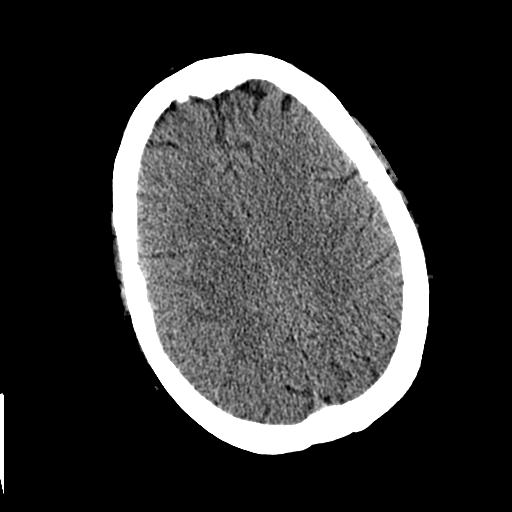
[im 43/60  brain]
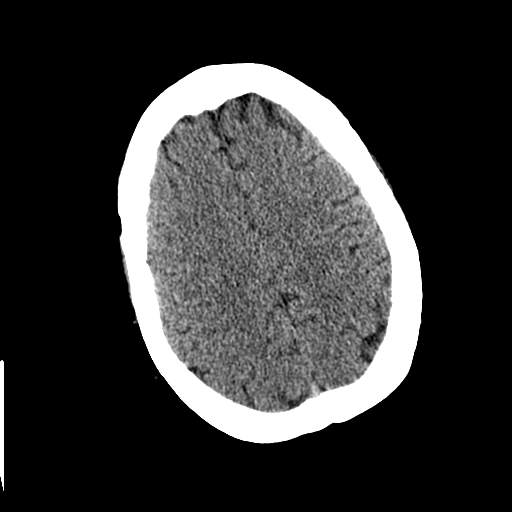
[im 46/60  brain]
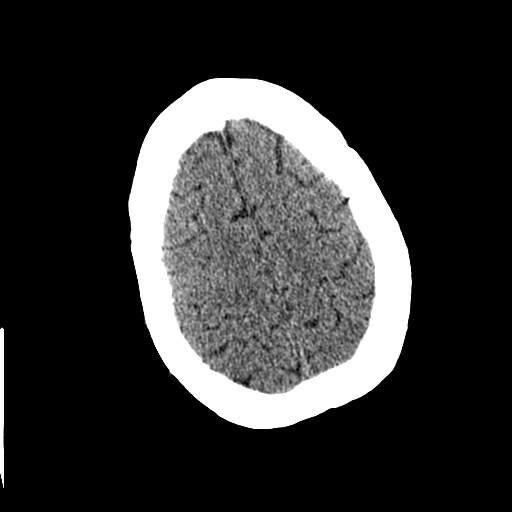
[im 46/60  bone]
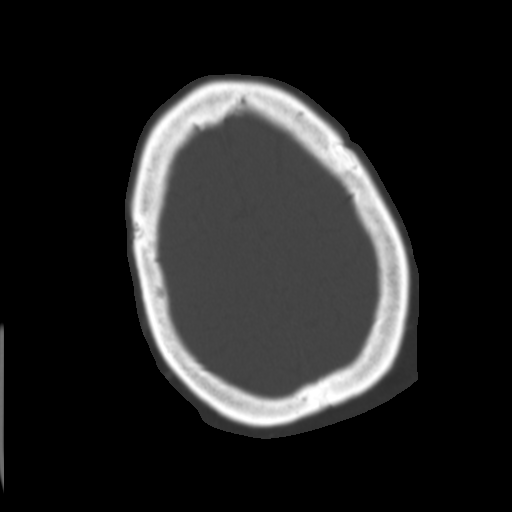
[im 50/60  brain]
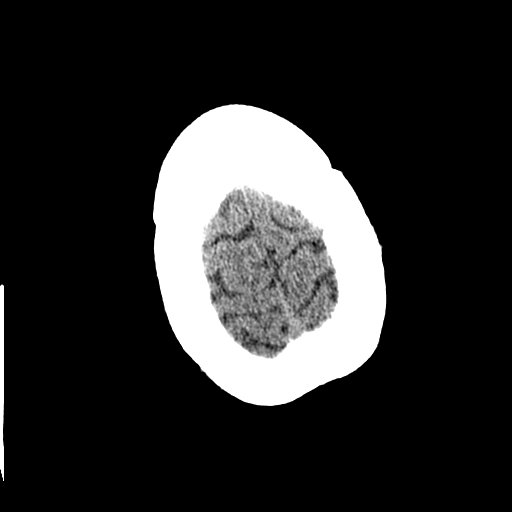
[im 53/60  brain]
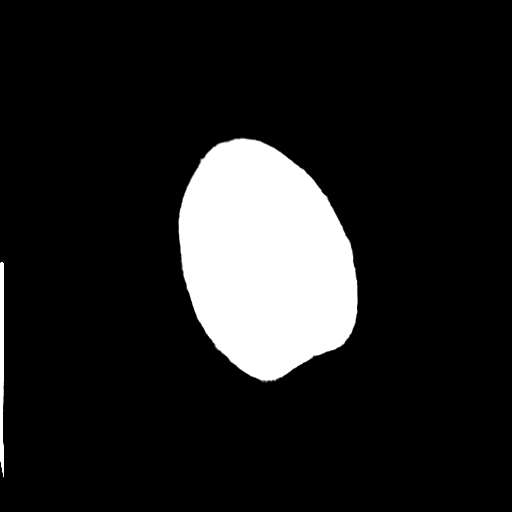
[im 56/60  brain]
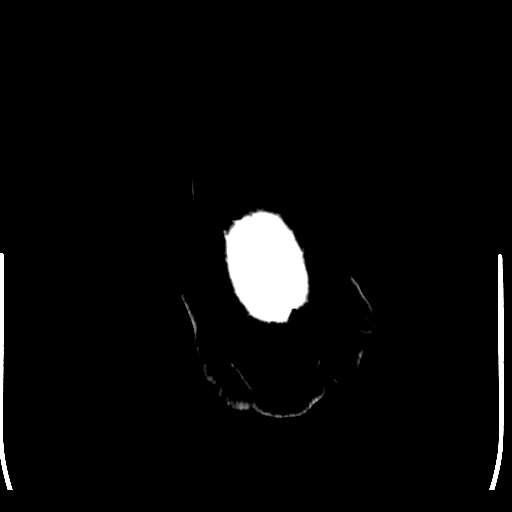

[16 of 30 positions shown; findings below may reference images not displayed]

FINDINGS: Brain: No evidence of acute infarction, hemorrhage, hydrocephalus,
extra-axial collection or mass lesion/mass effect.

Vascular: No hyperdense vessel or unexpected calcification.

Skull: Normal. Negative for fracture or focal lesion.

Sinuses/Orbits: No acute finding.

Other: None.
IMPRESSION: Normal examination.

## 2018-07-04 IMAGING — DX DG CHEST 2V
2 series · 2 of 2 positions shown · non-contrast
Comparison: 12/13/2015.

CLINICAL DATA: Left lower chest pain. Shortness of breath.
Productive cough. Bilateral leg pain. Morbid obesity.

EXAM:
CHEST  2 VIEW

[chest pa]
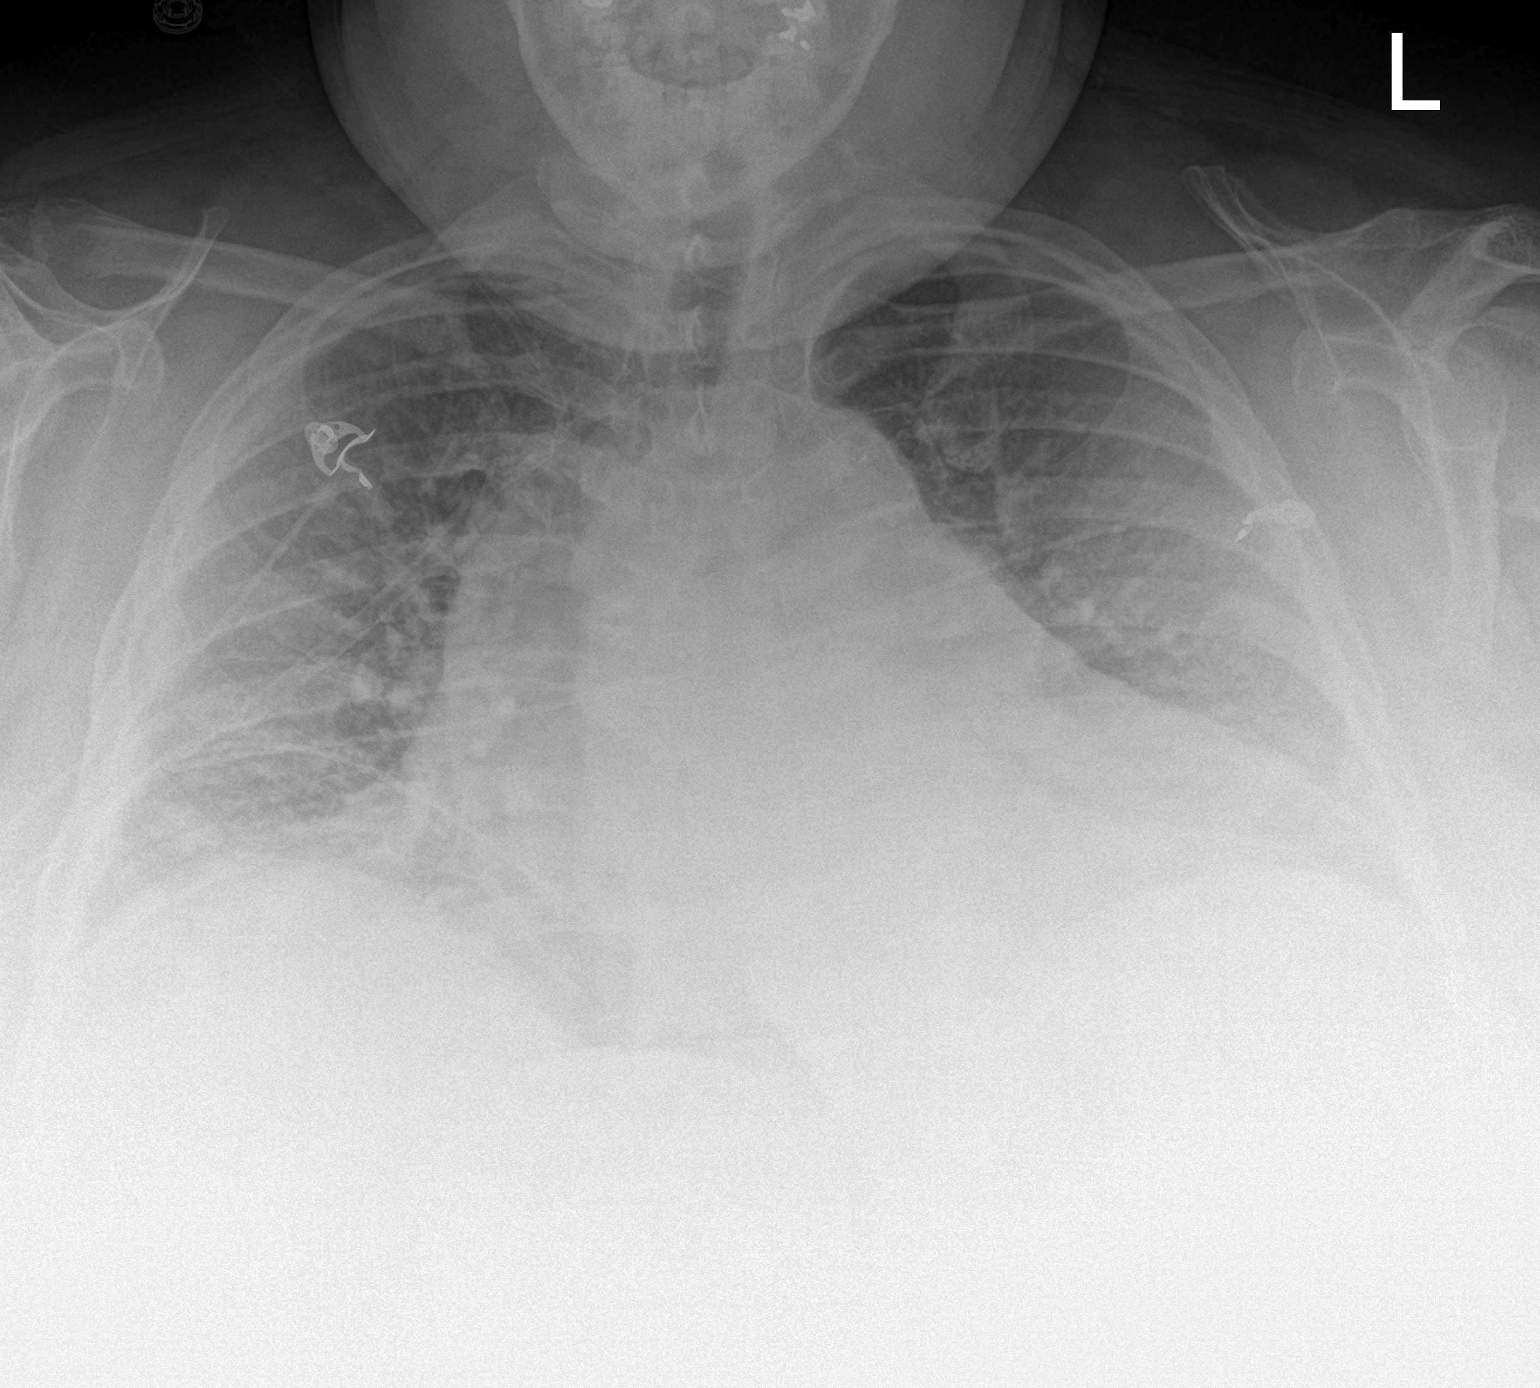

[chest lat]
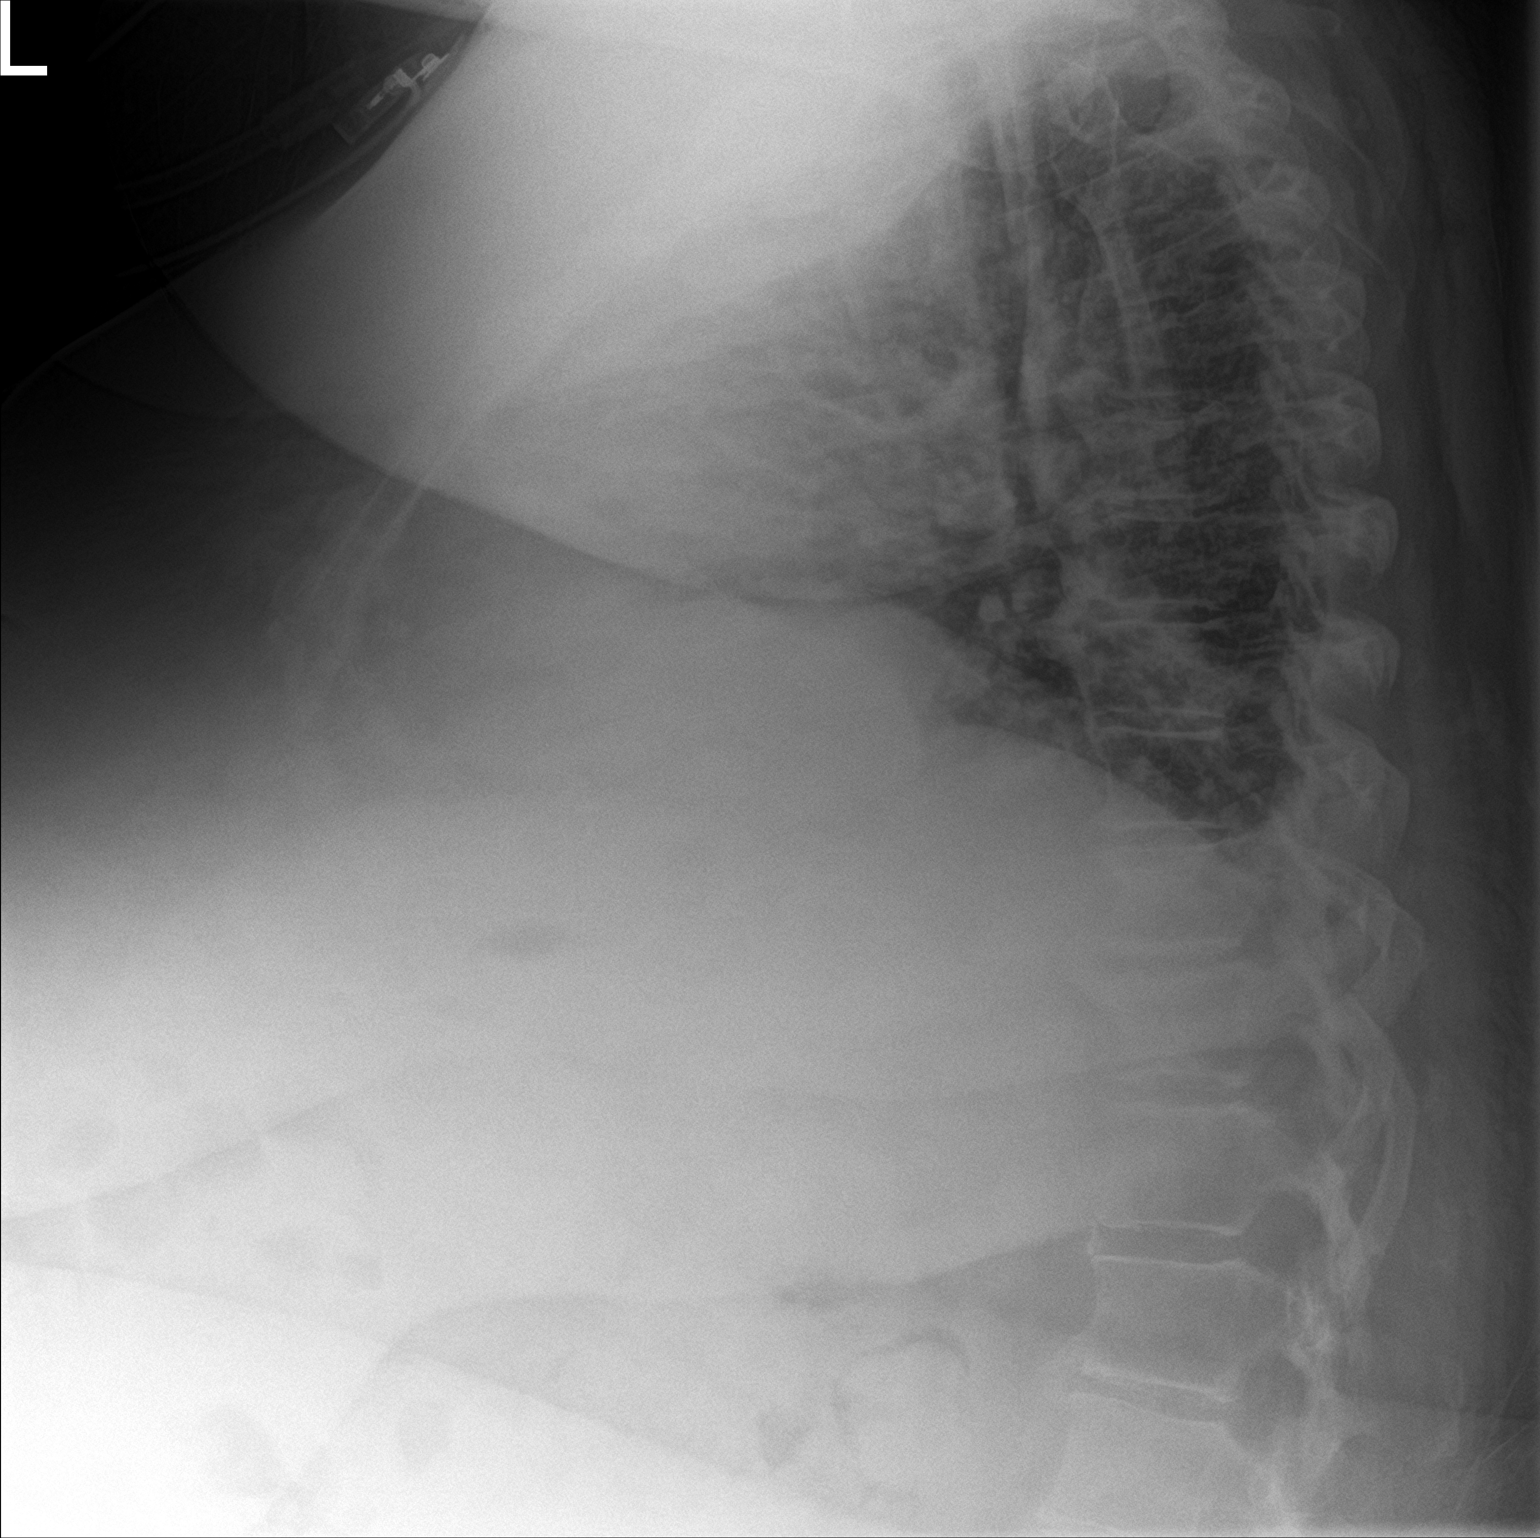

[2 of 2 positions shown; findings below may reference images not displayed]

FINDINGS: Stable enlarged cardiac silhouette and prominent pulmonary
vasculature. A poor inspiration is again demonstrated. Grossly clear
lungs. Thoracic spine degenerative changes.
IMPRESSION: Stable poor inspiration, cardiomegaly and pulmonary vascular
congestion.

## 2018-07-05 ENCOUNTER — Telehealth: Payer: Self-pay | Admitting: Internal Medicine

## 2018-07-05 DIAGNOSIS — M170 Bilateral primary osteoarthritis of knee: Secondary | ICD-10-CM

## 2018-07-05 MED ORDER — ACETAMINOPHEN-CODEINE #3 300-30 MG PO TABS: 1.0000 | ORAL_TABLET | Freq: Three times a day (TID) | ORAL | 0 refills | Status: DC | PRN

## 2018-07-05 NOTE — Telephone Encounter (Signed)
1) Medication(s) Requested (by name):acetaminophen-codeine (TYLENOL #3) 300-30 MG    2) Pharmacy of Choice: [facility]   3) Special Requests:   Approved medications will be sent to the pharmacy, we will reach out if there is an issue.  Requests made after 3pm may not be addressed until the following business day!  If a patient is unsure of the name of the medication(s) please note and ask patient to call back when they are able to provide all info, do not send to responsible party until all information is available!

## 2018-07-06 ENCOUNTER — Telehealth: Payer: Self-pay | Admitting: Internal Medicine

## 2018-07-06 ENCOUNTER — Other Ambulatory Visit: Payer: Self-pay | Admitting: Internal Medicine

## 2018-07-06 DIAGNOSIS — M170 Bilateral primary osteoarthritis of knee: Secondary | ICD-10-CM

## 2018-07-07 IMAGING — DX DG CHEST 1V PORT
1 series · 1 of 1 positions shown · non-contrast
Comparison: December 16, 2015

CLINICAL DATA: Recent pulmonary vascular congestion

EXAM:
PORTABLE CHEST 1 VIEW

[chest ap]
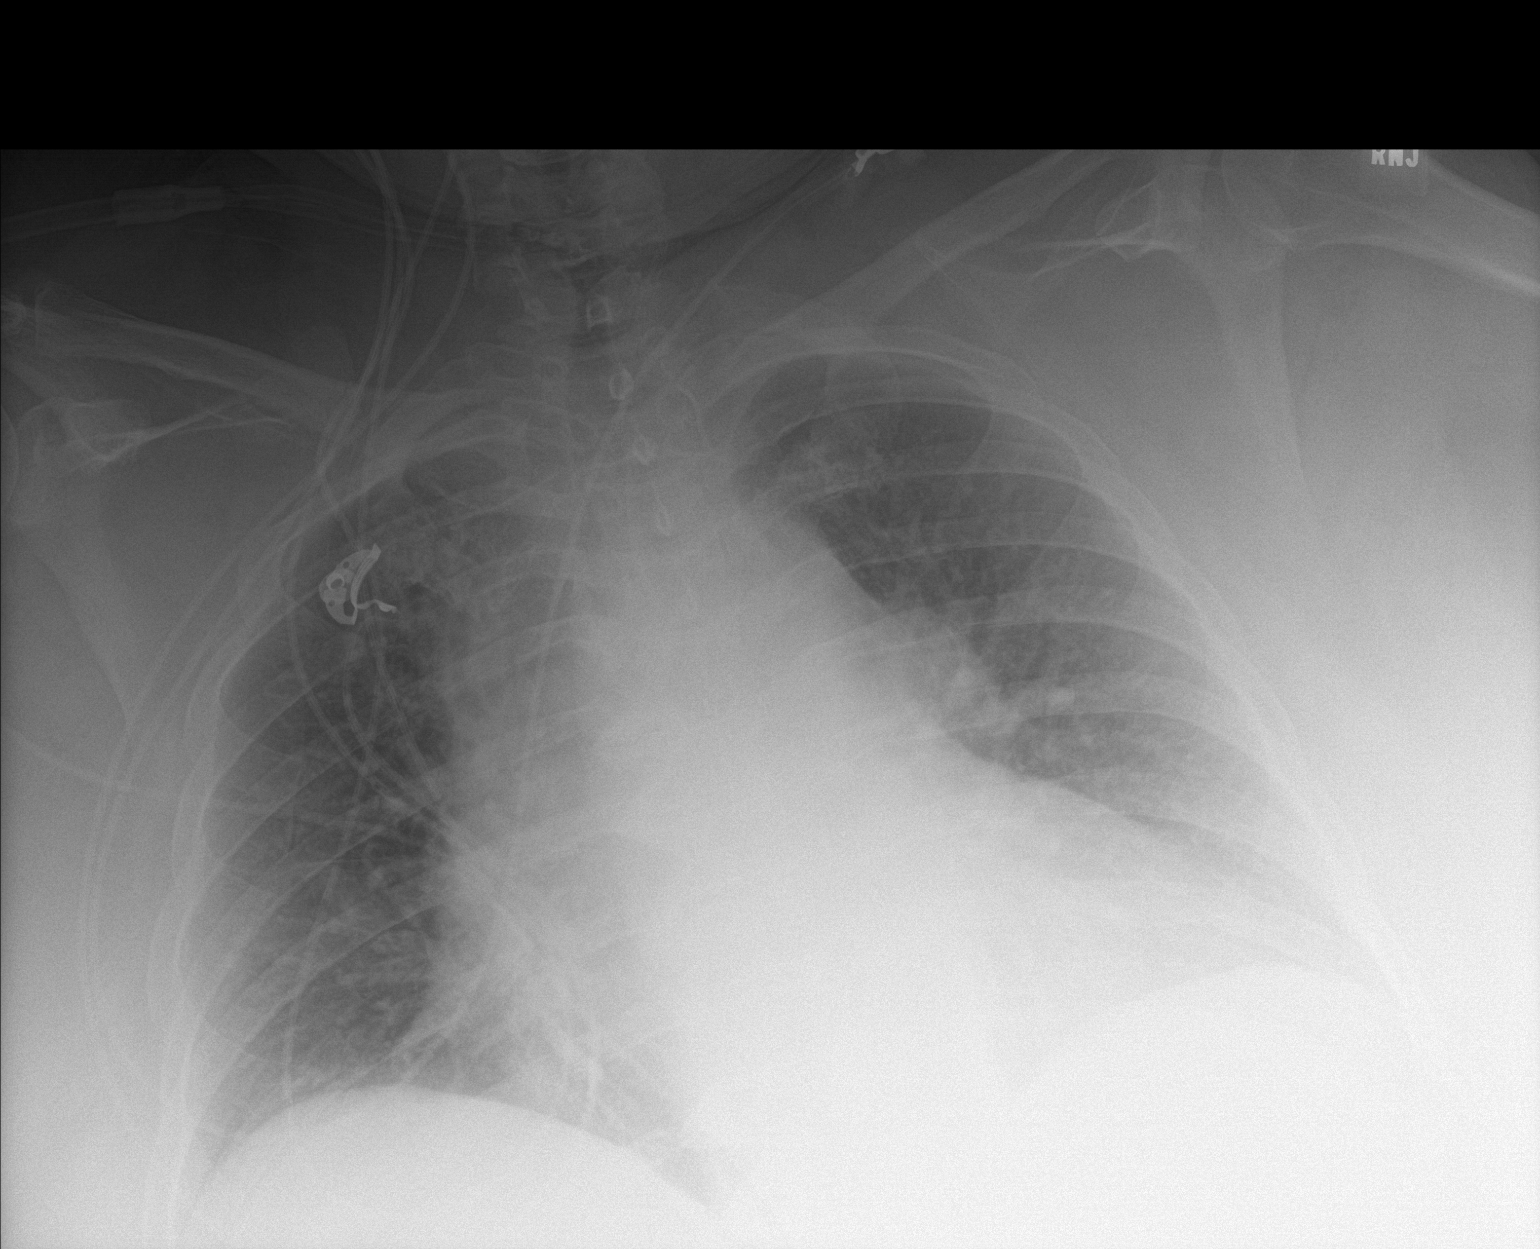

[1 of 1 positions shown; findings below may reference images not displayed]

FINDINGS: There is no edema or consolidation. There remains cardiomegaly with
pulmonary venous hypertension. No adenopathy. No bone lesions.
IMPRESSION: Persistent evidence of pulmonary vascular congestion without frank
edema or consolidation.

## 2018-07-07 MED FILL — ACETAMINOPHEN/COD #3 TABLET: 300-30 | 23 days supply | Qty: 70 | Fill #0

## 2018-07-07 NOTE — Telephone Encounter (Signed)
Received rx for Tylenol 3. Took it to [place] in the pharmacy

## 2018-07-10 IMAGING — DX DG CHEST 2V
2 series · 3 of 3 positions shown · non-contrast
Comparison: Radiograph December 19, 2015.

CLINICAL DATA: Shortness of breath.

EXAM:
CHEST  2 VIEW

[Series 1: chest pa · 0.14mm/px · 2 of 2 slices shown]
[im 1/2]
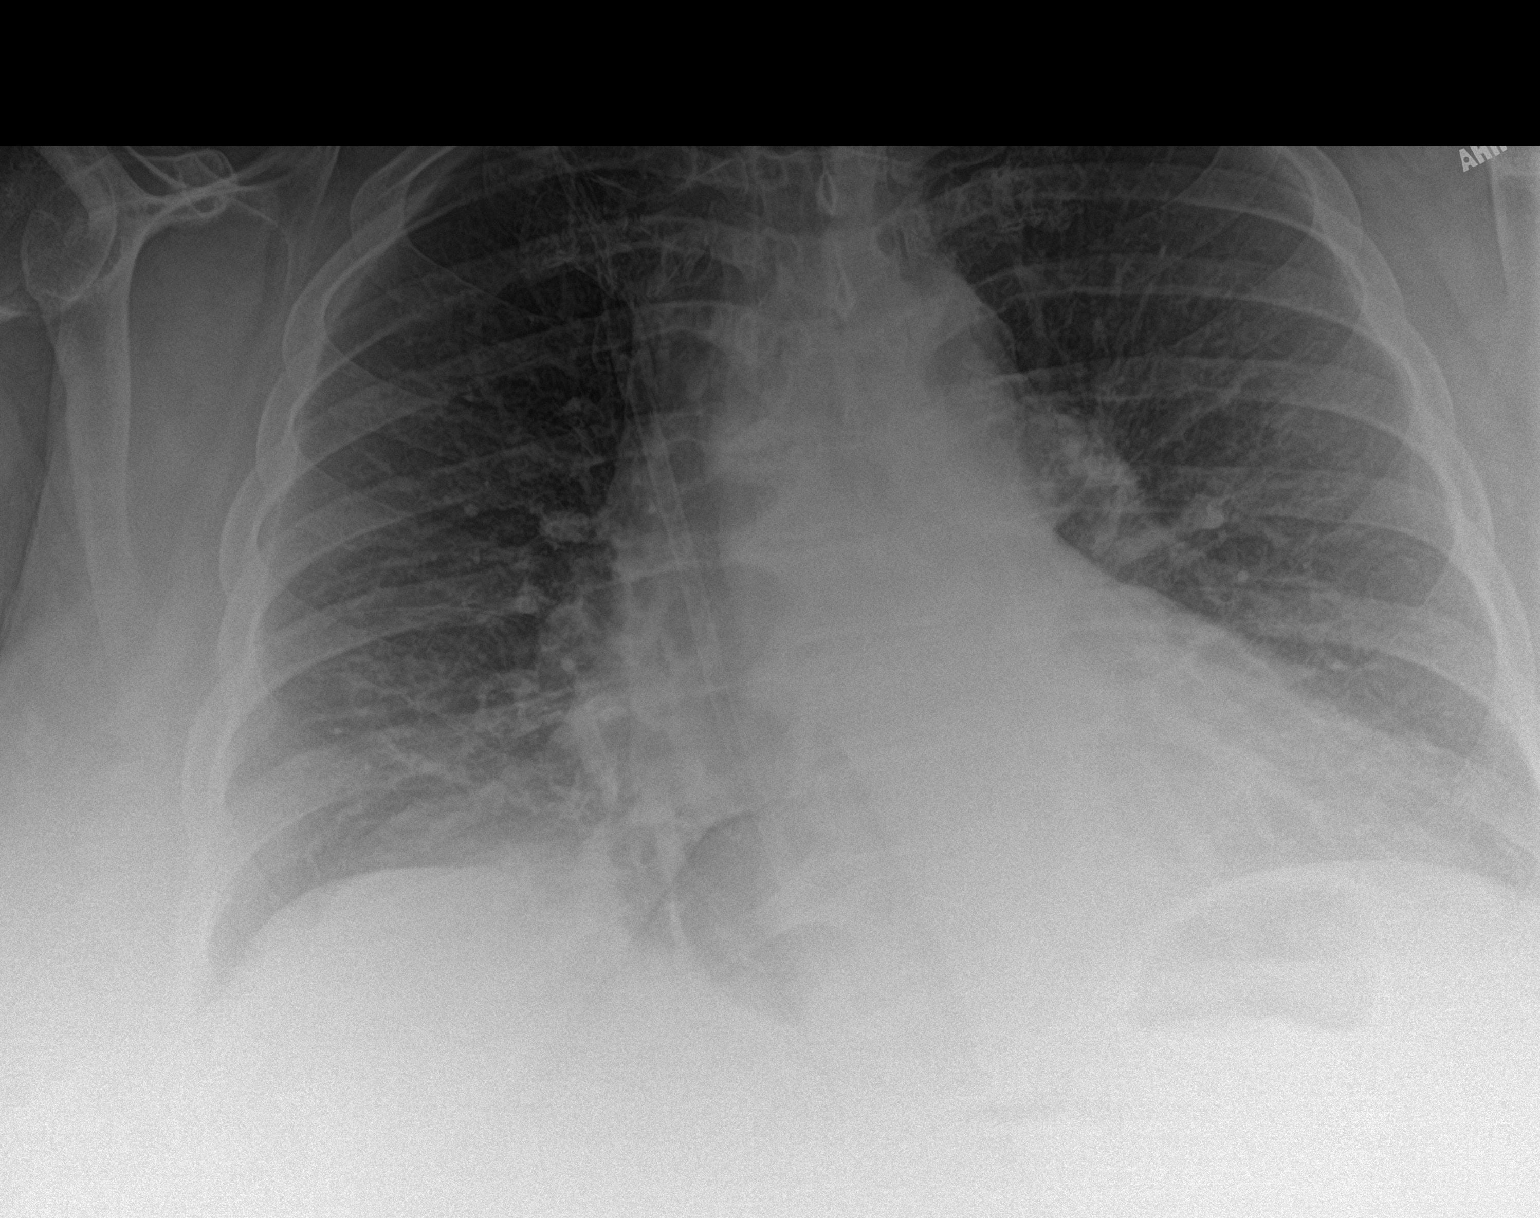
[im 2/2]
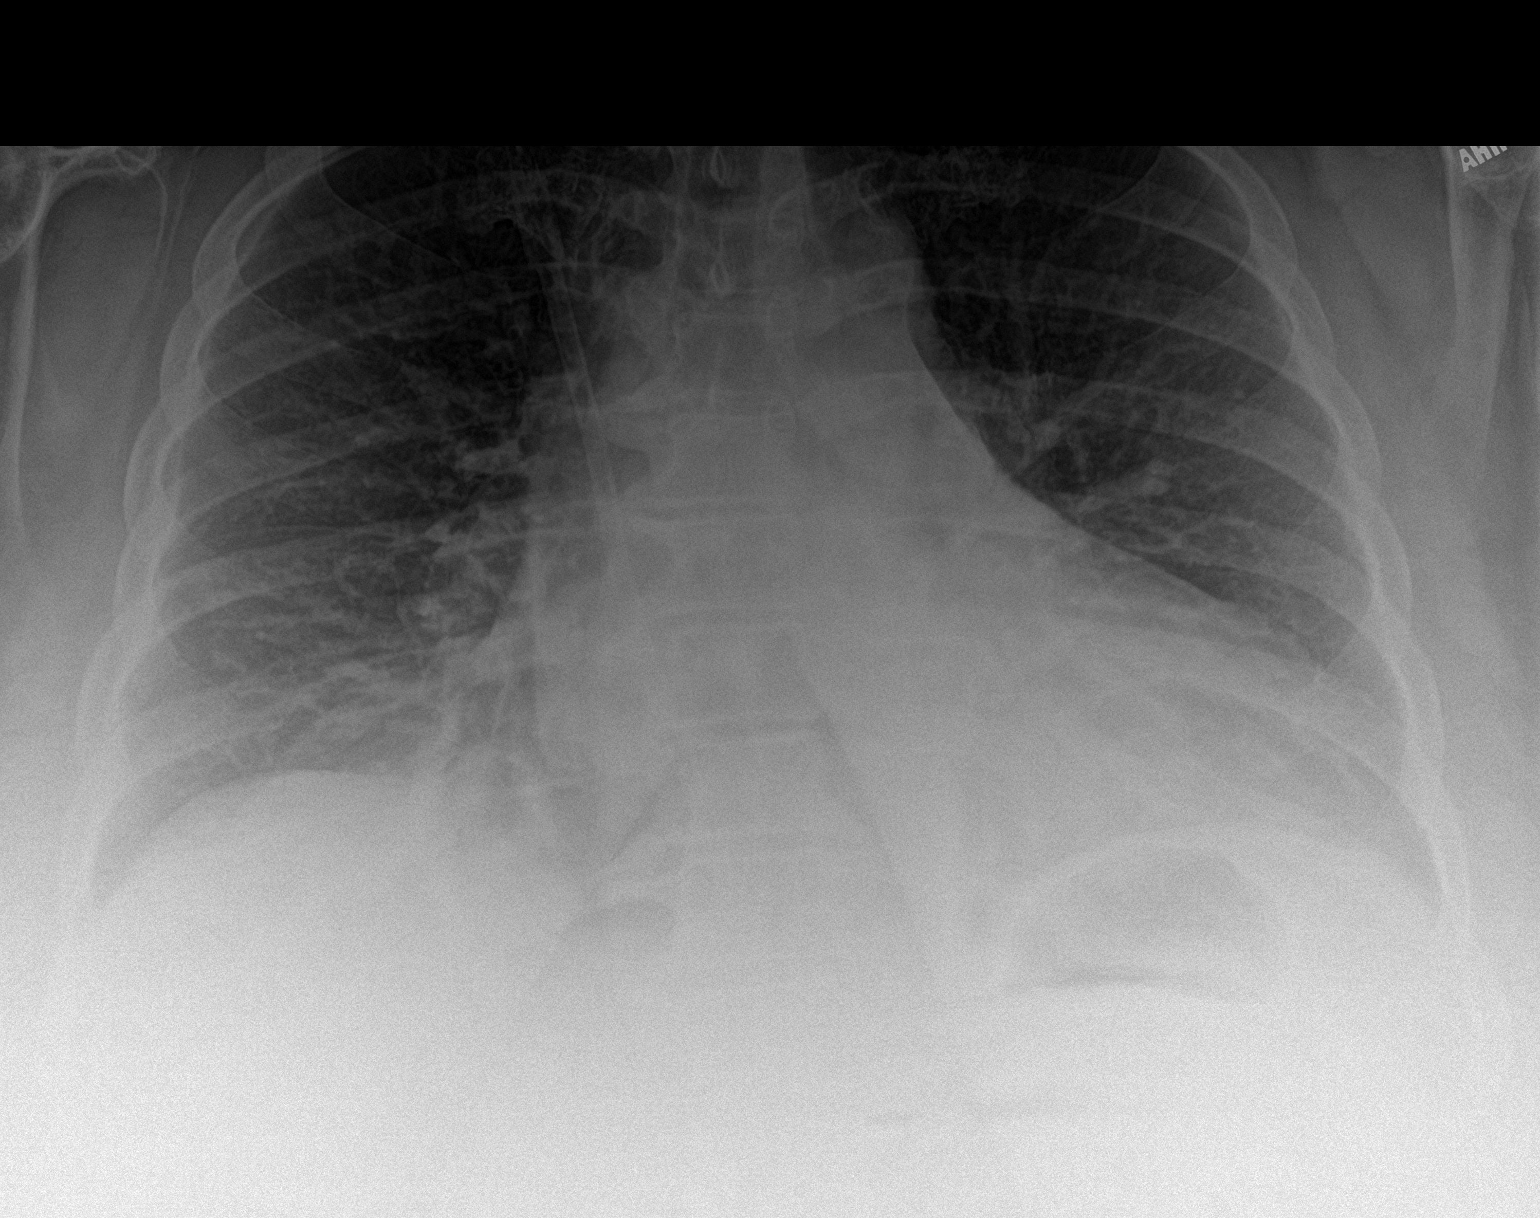

[chest lat]
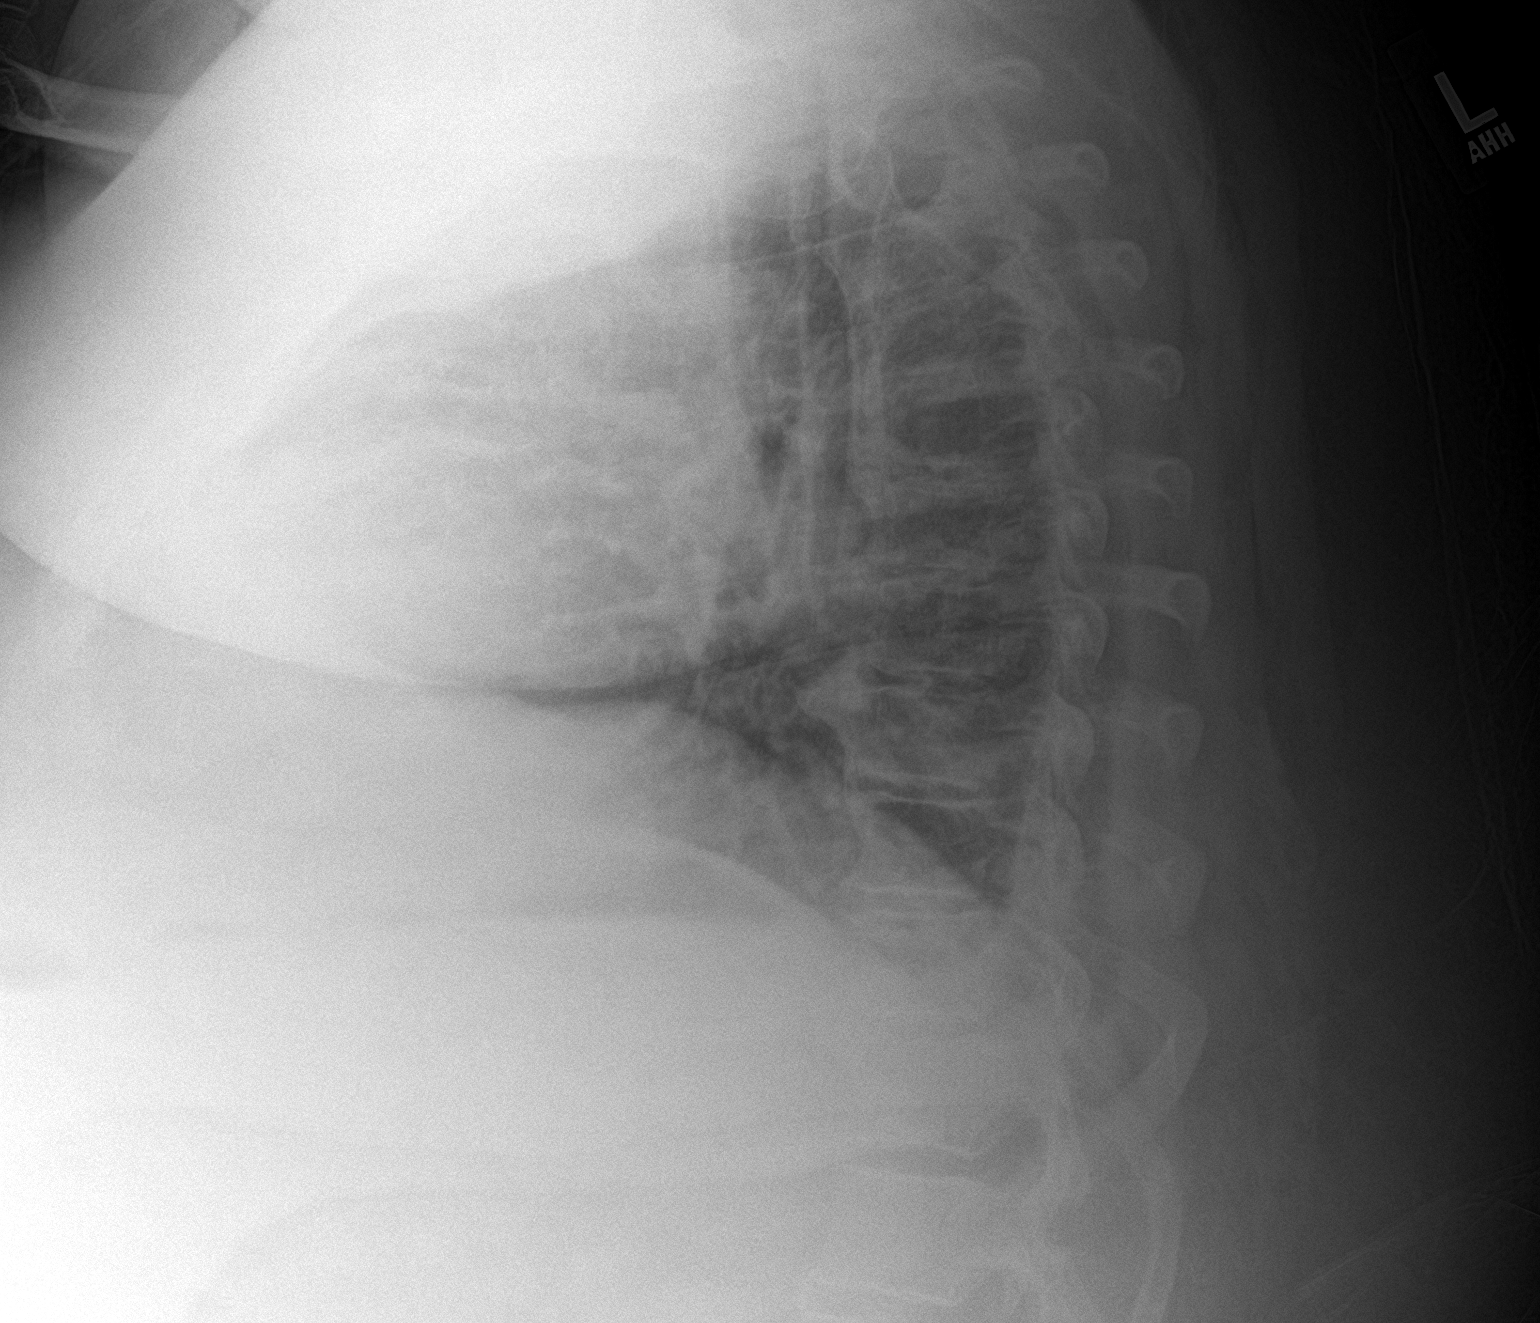

[3 of 3 positions shown; findings below may reference images not displayed]

FINDINGS: Stable cardiomegaly. No pneumothorax or pleural effusion is noted.
Left lung appears clear. Right basilar subsegmental atelectasis or
infiltrate is noted. Osteophyte formation is seen in the thoracic
spine.
IMPRESSION: Right basilar subsegmental atelectasis or infiltrate.

## 2018-07-29 ENCOUNTER — Other Ambulatory Visit: Payer: Self-pay | Admitting: Internal Medicine

## 2018-07-29 DIAGNOSIS — M170 Bilateral primary osteoarthritis of knee: Secondary | ICD-10-CM

## 2018-07-29 NOTE — Telephone Encounter (Signed)
Patient called requesting acetaminophen-codeine (TYLENOL #3) 300-30 MG tablet [[id number]]  To be sent to [facility] on [address], please follow up.

## 2018-07-30 MED ORDER — ACETAMINOPHEN-CODEINE #3 300-30 MG PO TABS: 1.0000 | ORAL_TABLET | Freq: Three times a day (TID) | ORAL | 0 refills | Status: DC | PRN

## 2018-07-30 NOTE — Telephone Encounter (Signed)
Rx called to pharmacy

## 2018-08-09 ENCOUNTER — Other Ambulatory Visit: Payer: Self-pay | Admitting: Internal Medicine

## 2018-08-19 ENCOUNTER — Ambulatory Visit: Payer: Self-pay | Admitting: Internal Medicine

## 2018-08-20 ENCOUNTER — Ambulatory Visit: Payer: Medicaid Other | Attending: Internal Medicine | Admitting: Internal Medicine

## 2018-08-20 ENCOUNTER — Other Ambulatory Visit (HOSPITAL_COMMUNITY)
Admission: RE | Admit: 2018-08-20 | Discharge: 2018-08-20 | Disposition: A | Discharge status: Home / Self Care | Payer: Medicaid Other | Source: Ambulatory Visit | Attending: Internal Medicine | Admitting: Internal Medicine

## 2018-08-20 ENCOUNTER — Other Ambulatory Visit: Payer: Self-pay

## 2018-08-20 ENCOUNTER — Ambulatory Visit: Payer: Medicaid Other | Admitting: Internal Medicine

## 2018-08-20 ENCOUNTER — Encounter: Payer: Self-pay | Admitting: Internal Medicine

## 2018-08-20 VITALS — BP 127/76 | HR 68 | Temp 98.8°F

## 2018-08-20 DIAGNOSIS — Z124 Encounter for screening for malignant neoplasm of cervix: Secondary | ICD-10-CM | POA: Insufficient documentation

## 2018-08-20 DIAGNOSIS — Z833 Family history of diabetes mellitus: Secondary | ICD-10-CM | POA: Diagnosis not present

## 2018-08-20 DIAGNOSIS — E785 Hyperlipidemia, unspecified: Secondary | ICD-10-CM | POA: Diagnosis not present

## 2018-08-20 DIAGNOSIS — Z888 Allergy status to other drugs, medicaments and biological substances status: Secondary | ICD-10-CM | POA: Diagnosis not present

## 2018-08-20 DIAGNOSIS — Z1239 Encounter for other screening for malignant neoplasm of breast: Secondary | ICD-10-CM

## 2018-08-20 DIAGNOSIS — L309 Dermatitis, unspecified: Secondary | ICD-10-CM | POA: Diagnosis not present

## 2018-08-20 DIAGNOSIS — Z881 Allergy status to other antibiotic agents status: Secondary | ICD-10-CM | POA: Diagnosis not present

## 2018-08-20 DIAGNOSIS — E662 Morbid (severe) obesity with alveolar hypoventilation: Secondary | ICD-10-CM | POA: Diagnosis not present

## 2018-08-20 DIAGNOSIS — Z87891 Personal history of nicotine dependence: Secondary | ICD-10-CM | POA: Insufficient documentation

## 2018-08-20 DIAGNOSIS — J45909 Unspecified asthma, uncomplicated: Secondary | ICD-10-CM | POA: Diagnosis not present

## 2018-08-20 DIAGNOSIS — Z79899 Other long term (current) drug therapy: Secondary | ICD-10-CM | POA: Diagnosis not present

## 2018-08-20 DIAGNOSIS — Z886 Allergy status to analgesic agent status: Secondary | ICD-10-CM | POA: Diagnosis not present

## 2018-08-20 DIAGNOSIS — Z8261 Family history of arthritis: Secondary | ICD-10-CM | POA: Insufficient documentation

## 2018-08-20 DIAGNOSIS — G894 Chronic pain syndrome: Secondary | ICD-10-CM | POA: Diagnosis not present

## 2018-08-20 DIAGNOSIS — N184 Chronic kidney disease, stage 4 (severe): Secondary | ICD-10-CM | POA: Diagnosis not present

## 2018-08-20 DIAGNOSIS — Z8249 Family history of ischemic heart disease and other diseases of the circulatory system: Secondary | ICD-10-CM | POA: Insufficient documentation

## 2018-08-20 DIAGNOSIS — I130 Hypertensive heart and chronic kidney disease with heart failure and stage 1 through stage 4 chronic kidney disease, or unspecified chronic kidney disease: Secondary | ICD-10-CM | POA: Diagnosis not present

## 2018-08-20 DIAGNOSIS — M109 Gout, unspecified: Secondary | ICD-10-CM | POA: Diagnosis not present

## 2018-08-20 DIAGNOSIS — Z880 Allergy status to penicillin: Secondary | ICD-10-CM | POA: Diagnosis not present

## 2018-08-20 DIAGNOSIS — K219 Gastro-esophageal reflux disease without esophagitis: Secondary | ICD-10-CM | POA: Diagnosis not present

## 2018-08-20 DIAGNOSIS — I509 Heart failure, unspecified: Secondary | ICD-10-CM | POA: Insufficient documentation

## 2018-08-20 DIAGNOSIS — M170 Bilateral primary osteoarthritis of knee: Secondary | ICD-10-CM | POA: Insufficient documentation

## 2018-08-20 DIAGNOSIS — F419 Anxiety disorder, unspecified: Secondary | ICD-10-CM | POA: Diagnosis not present

## 2018-08-20 DIAGNOSIS — Z882 Allergy status to sulfonamides status: Secondary | ICD-10-CM | POA: Diagnosis not present

## 2018-08-20 DIAGNOSIS — Z825 Family history of asthma and other chronic lower respiratory diseases: Secondary | ICD-10-CM | POA: Diagnosis not present

## 2018-08-20 MED ORDER — ACETAMINOPHEN-CODEINE #3 300-30 MG PO TABS: 1.0000 | ORAL_TABLET | Freq: Three times a day (TID) | ORAL | 0 refills | Status: DC | PRN

## 2018-08-20 MED ORDER — TRIAMCINOLONE ACETONIDE 0.1 % EX CREA: TOPICAL_CREAM | CUTANEOUS | 1 refills | Status: DC

## 2018-08-20 MED ORDER — DICLOFENAC SODIUM 1 % TD GEL: 4.0000 g | Freq: Four times a day (QID) | TRANSDERMAL | 3 refills | Status: DC

## 2018-08-20 MED ORDER — NYSTATIN 100000 UNIT/GM EX POWD: Freq: Four times a day (QID) | CUTANEOUS | 5 refills | Status: DC

## 2018-08-20 MED FILL — NYSTATIN 100000 UNIT/GM POW: 100000 | 15 days supply | Qty: 60 | Fill #0

## 2018-08-20 MED FILL — TRIAMCINOLONE ACETONIDE 0.1: 0.1 | 25 days supply | Qty: 80 | Fill #0

## 2018-08-20 NOTE — Progress Notes (Signed)
Patient ID: Kara Vincent, female    DOB: 1965-05-27  MRN: [id number]  CC: Gynecologic Exam and Vaginal Discharge   Subjective: Kara Vincent is a 53 y.o. female who presents for Pap smear.  He was last seen by me in February of this year. Her concerns today include:  Patient with history of asthma, OSA,HTN,dCHF, aortic stenosis and CKD stage 3-4, anxiety, morbid obesity, chronic pain syndrome, gout,urge incontinence  Last pap was several yrs ago No abn paps in past. Last sexually active yrs ago.  No vaginal dischg.  Intermittent vaginal itching.  History of recurrent vaginal yeast Menopausal She complains of cysts in groin area  that bleeds intermittently.   Over due for MMG  OA knees and back:  request higher dose on Tyl#3.  States that the medication helps but she feels she needs to take it more often.  She is also requesting a arthritis pill like Celebrex to help.  She has chronic kidney disease and is not on NSAID for this reason.  OSA: On last visit I gave her the phone number for the sleep lab at [facility] for her to call and schedule her sleep study.  She has not done so.  Right now I think they are not doing any sleep studies due to the COVID pandemic  Morbid obesity: She tells me that she is eating smaller portion sizes.  Most of the time she is in her motorized wheelchair.  She does limited walking with her walker.  She still resides at a motel. Patient Active Problem List   Diagnosis Date Noted  . Morbid obesity ([place]) 12/01/2017  . Primary osteoarthritis of both knees 12/01/2017  . Anxiety 12/01/2017  . Impaired mobility 12/01/2017  . Essential hypertension 12/14/2015  . HLD (hyperlipidemia) 12/14/2015  . GERD (gastroesophageal reflux disease) 12/14/2015  . Gout 12/14/2015  . Darier's disease   . Leukocytosis   . 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 Right-sided heart failure ([place]) 11/20/2014  . OSA (obstructive sleep apnea)   . CKD (chronic kidney  disease) stage 3, GFR 30-59 ml/min (HCC) 11/17/2014  . Asthma exacerbation 11/16/2014  . Venous stasis 11/16/2014  . Acute on chronic respiratory failure with hypoxia and hypercapnia ([place]) 11/16/2014  . Chronic pain 08/24/2011  . Obesity hypoventilation syndrome ([place]) 08/24/2011     Current Outpatient Medications on File Prior to Visit  Medication Sig Dispense Refill  . albuterol (PROVENTIL HFA;VENTOLIN HFA) 108 (90 Base) MCG/ACT inhaler Inhale 1-2 puffs into the lungs every 6 (six) hours as needed for wheezing or shortness of breath. 1 Inhaler 6  . allopurinol (ZYLOPRIM) 100 MG tablet Take 1 tablet (100 mg total) by mouth every morning. 30 tablet 6  . atorvastatin (LIPITOR) 20 MG tablet Take 1 tablet (20 mg total) by mouth every morning. 30 tablet 6  . cloNIDine (CATAPRES) 0.2 MG tablet Take 1 tablet (0.2 mg total) by mouth 2 (two) times daily. 60 tablet 6  . furosemide (LASIX) 40 MG tablet Take 1 tablet (40 mg total) by mouth daily. 1 tab po daily (Patient not taking: Reported on 03/29/2018) 60 tablet 3  . gabapentin (NEURONTIN) 100 MG capsule Take 100 mg by mouth 3 (three) times daily.    . hydrOXYzine (ATARAX/VISTARIL) 25 MG tablet TAKE ONE TABLET BY MOUTH THREE TIMES DAILY AS NEEDED FOR ANXIETY OR FOR ITCHING 90 tablet 2  . hydrOXYzine (ATARAX/VISTARIL) 50 MG tablet TAKE 1 TABLET BY MOUTH THREE TIMES DAILY AS NEEDED FOR ANXIETY/ITCH 60  tablet 2  . ipratropium-albuterol (DUONEB) 0.5-2.5 (3) MG/3ML SOLN Take 3 mLs by nebulization every 6 (six) hours as needed (SOB). 3 mL 0  . isosorbide mononitrate (IMDUR) 30 MG 24 hr tablet Take 1 tablet (30 mg total) by mouth daily. 30 tablet 6  . lisinopril (PRINIVIL,ZESTRIL) 40 MG tablet Take 1 tablet (40 mg total) by mouth daily. 20 tablet 6  . Misc. Devices MISC Please provide patient with a raised toilet seat that is covered under her current insurance plan. 1 each 0  . mupirocin cream (BACTROBAN) 2 % Apply to affected area twice a day 15 g 0  .  nystatin cream (MYCOSTATIN) APPLY TOPICALLY TO THE AFFECTED AREA(S) TWICE DAILY (Patient not taking: Reported on 05/21/2018) 60 g 0  . pantoprazole (PROTONIX) 40 MG tablet Take 1 tablet (40 mg total) by mouth daily. 30 tablet 5  . sertraline (ZOLOFT) 50 MG tablet Take 1 tablet (50 mg total) by mouth daily. 30 tablet 3  . solifenacin (VESICARE) 5 MG tablet Take 1 tablet (5 mg total) by mouth every morning. 30 tablet 6  . [DISCONTINUED] diphenhydrAMINE (SOMINEX) 25 MG tablet Take 25 mg by mouth at bedtime as needed.     No current facility-administered medications on file prior to visit.     Allergies  Allergen Reactions  . Ibuprofen Hives, Shortness Of Breath, Palpitations and Rash    Has tolerated toradol 12/2013 inpatient  . Penicillins Anaphylaxis, Hives and Rash    Has patient had a PCN reaction causing immediate rash, facial/tongue/throat swelling, SOB or lightheadedness with hypotension: Yes Has patient had a PCN reaction causing severe rash involving mucus membranes or skin necrosis: Yes Has patient had a PCN reaction that required hospitalization Unknown Has patient had a PCN reaction occurring within the last 10 years: Unknown If all of the above answers are "NO", then may proceed with Cephalosporin use.   . Sulfa Antibiotics Hives, Shortness Of Breath, Rash and Cough  . Adhesive [Tape] Rash  . Doxycycline Hives, Swelling and Rash  . Sulfamethoxazole-Trimethoprim Rash  . Zithromax [Azithromycin] Hives, Swelling and Rash    Social History   Socioeconomic History  . Marital status: Single    Spouse name: Not on file  . Number of children: Not on file  . Years of education: Not on file  . Highest education level: Not on file  Occupational History  . Not on file  Social Needs  . Financial resource strain: Not on file  . Food insecurity:    Worry: Not on file    Inability: Not on file  . Transportation needs:    Medical: Not on file    Non-medical: Not on file  Tobacco  Use  . Smoking status: Former Smoker    Types: Cigarettes    Last attempt to quit: 03/25/2003    Years since quitting: 15.4  . Smokeless tobacco: Former Systems developer    Quit date: 05/27/1978  Substance and Sexual Activity  . Alcohol use: Yes    Alcohol/week: 0.0 standard drinks    Comment: years ago  no longer beer only  early 20's  . Drug use: Yes    Types: "Crack" cocaine, Other-see comments    Comment: years  ago  . Sexual activity: Yes    Birth control/protection: Other-see comments, None, Abstinence  Lifestyle  . Physical activity:    Days per week: Not on file    Minutes per session: Not on file  . Stress: Not on file  Relationships  .  Social connections:    Talks on phone: Not on file    Gets together: Not on file    Attends religious service: Not on file    Active member of club or organization: Not on file    Attends meetings of clubs or organizations: Not on file    Relationship status: Not on file  . Intimate partner violence:    Fear of current or ex partner: Not on file    Emotionally abused: Not on file    Physically abused: Not on file    Forced sexual activity: Not on file  Other Topics Concern  . Not on file  Social History Narrative   ** Merged History Encounter **       Patient has exhausted [facility] financial resources. She does have access to [employer] and [employer]. She should be redirected to purse disability with her attorney and reapply of Medicaid    Family History  Problem Relation Age of Onset  . Asthma Mother   . Diabetes Father   . Stroke Father   . Heart disease Father   . Arthritis Sister   . Asthma Sister   . Asthma Daughter   . Asthma Son   . Arthritis Sister   . Asthma Sister     Past Surgical History:  Procedure Laterality Date  . BACK SURGERY    . CESAREAN SECTION    . CESAREAN SECTION    . FRACTURE SURGERY    . TUBAL LIGATION      ROS: Review of Systems Negative except as stated above  PHYSICAL EXAM: BP 127/76    Pulse 68   Temp 98.8 F (37.1 C) (Oral)   LMP 03/24/2009   SpO2 94%   Physical Exam General appearance - alert, morbidly obese Caucasian female and in no distress Mental status - alert, oriented to person, place, and time Chest - clear to auscultation, no wheezes, rales or rhonchi, symmetric air entry Heart - normal rate, regular rhythm, normal S1, S2, no murmurs, rubs, clicks or gallops Breasts: Breasts are very pendulous.  No palpable masses.  No discharge from the nipple. Pelvic -she has intertrigo under the abdominal fold in the groin and the upper thighs.  No external vaginal lesions.  She appears to have either scar tissue and previous I&D or nondraining fistula on the upper inner thigh on both sides close to the labia.  Clear mucus in the vaginal vault.  Cervix appears normal.  No cervical motion tenderness.  Due to large body habitus, manual exam was a bit limited but I did not feel any abnormal adnexal masses Skin -for overall hygiene with foul odor especially coming from under the breasts and abdominal folds excoriated macular rash over the upper and mid posterior thorax. Intertrigo with foul odor under both breasts and under the abdominal fold in the groin area Skin: Patient in a motorized wheelchair.  She has moderate crepitus and discomfort on passive range of motion of both knees. CMP Latest Ref Rng & Units 05/21/2018 04/06/2018 03/29/2018  Glucose 65 - 99 mg/dL 85 106(H) 77  BUN 6 - 24 mg/dL 19 35(H) 35(H)  Creatinine 0.57 - 1.00 mg/dL 1.39(H) 2.08(H) 1.89(H)  Sodium 134 - 144 mmol/L 144 143 144  Potassium 3.5 - 5.2 mmol/L 3.9 5.2(H) 5.3(H)  Chloride 96 - 106 mmol/L 100 106 106  CO2 20 - 29 mmol/L 26 27 25   Calcium 8.7 - 10.2 mg/dL 9.1 8.4(L) 8.3(L)  Total Protein 6.0 - 8.5  g/dL - - 6.6  Total Bilirubin 0.0 - 1.2 mg/dL - - <0.2  Alkaline Phos 39 - 117 IU/L - - 61  AST 0 - 40 IU/L - - 9  ALT 0 - 32 IU/L - - 7   Lipid Panel     Component Value Date/Time   CHOL 190 12/01/2017  1541   TRIG 261 (H) 12/01/2017 1541   HDL 37 (L) 12/01/2017 1541   CHOLHDL 5.1 (H) 12/01/2017 1541   CHOLHDL 4.9 12/14/2015 0303   VLDL 26 12/14/2015 0303   LDLCALC 101 (H) 12/01/2017 1541    CBC    Component Value Date/Time   WBC 9.0 04/06/2018 1653   RBC 3.70 (L) 04/06/2018 1653   HGB 11.0 (L) 04/06/2018 1653   HGB 12.8 12/01/2017 1541   HCT 38.3 04/06/2018 1653   HCT 40.7 12/01/2017 1541   PLT 219 04/06/2018 1653   PLT 217 12/01/2017 1541   MCV 103.5 (H) 04/06/2018 1653   MCV 93 12/01/2017 1541   MCH 29.7 04/06/2018 1653   MCHC 28.7 (L) 04/06/2018 1653   RDW 14.3 04/06/2018 1653   RDW 14.1 12/01/2017 1541   LYMPHSABS 2.1 10/15/2017 2320   MONOABS 0.4 10/15/2017 2320   EOSABS 0.3 10/15/2017 2320   BASOSABS 0.0 10/15/2017 2320    ASSESSMENT AND PLAN: 1. Pap smear for cervical cancer screening - Cytology - PAP - Cervicovaginal ancillary only  2. Breast cancer screening - MM Digital Screening; Future  3. Primary osteoarthritis of both knees Strongly encourage weight loss.  Will refer her to medical weight management. -We will get her to orthopedics to see whether they can do some injections to the knees and whether she is a surgical candidate.  I suspect that we will be a limiting factor in performing any surgery -Due to kidney function, we cannot put her on oral NSAID.  Refill Voltaren gel.  Change Tylenol with codeine to 90 tablets/month.  She reports some improvement with the Voltaren gel and Tylenol with codeine.  Denies any significant side effects from the Tylenol with codeine. Functional ability is a bit limited due to body size. Bent reviewed and is appropriate - Ambulatory referral to Orthopedic Surgery - acetaminophen-codeine (TYLENOL #3) 300-30 MG tablet; Take 1 tablet by mouth every 8 (eight) hours as needed for moderate pain (Fill on or after June 1st 2020). Each prescription to last one month  Dispense: 90 tablet; Refill: 0  4. Morbid obesity ([place]) -  Amb Ref to Medical Weight Management  5. Dermatitis I have refilled triamcinolone cream for her to use for the rash on her posterior thorax.  I have given prescription for nystatin powder for her to use under the breasts, abdominal fold and the groin area.  Encourage good hygiene. - triamcinolone cream (KENALOG) 0.1 %; APPLY TOPICALLY TO THE rash on your Back WICE DAILY  Dispense: 80 g; Refill: 1 - nystatin (MYCOSTATIN/NYSTOP) powder; Apply topically 4 (four) times daily. Apply beneath breast and below abdominal fold  Dispense: 60 g; Refill: 5   Patient was given the opportunity to ask questions.  Patient verbalized understanding of the plan and was able to repeat key elements of the plan.   Orders Placed This Encounter  Procedures  . MM Digital Screening  . Ambulatory referral to Orthopedic Surgery  . Amb Ref to Medical Weight Management     Requested Prescriptions   Signed Prescriptions Disp Refills  . diclofenac sodium (VOLTAREN) 1 % GEL 100 g 3  Sig: Apply 4 g topically 4 (four) times daily.  [name] triamcinolone cream (KENALOG) 0.1 % 80 g 1    Sig: APPLY TOPICALLY TO THE rash on your Back WICE DAILY  . nystatin (MYCOSTATIN/NYSTOP) powder 60 g 5    Sig: Apply topically 4 (four) times daily. Apply beneath breast and below abdominal fold  . acetaminophen-codeine (TYLENOL #3) 300-30 MG tablet 90 tablet 0    Sig: Take 1 tablet by mouth every 8 (eight) hours as needed for moderate pain (Fill on or after June 1st 2020). Each prescription to last one month    Return in about 3 months (around 11/20/2018).  Karle Plumber, MD, FACP

## 2018-08-20 NOTE — Progress Notes (Signed)
Pt states she has knots

## 2018-08-23 LAB — CERVICOVAGINAL ANCILLARY ONLY
Bacterial vaginitis: NEGATIVE
Candida vaginitis: POSITIVE — AB

## 2018-08-24 ENCOUNTER — Other Ambulatory Visit: Payer: Self-pay | Admitting: Internal Medicine

## 2018-08-24 LAB — CYTOLOGY - PAP
Chlamydia: NEGATIVE
Diagnosis: NEGATIVE
HPV: NOT DETECTED
Neisseria Gonorrhea: NEGATIVE
Trichomonas: NEGATIVE

## 2018-08-24 MED ORDER — FLUCONAZOLE 150 MG PO TABS: ORAL_TABLET | ORAL | 0 refills | Status: DC

## 2018-08-25 ENCOUNTER — Telehealth: Payer: Self-pay | Admitting: *Deleted

## 2018-08-25 NOTE — Telephone Encounter (Signed)
-----   Message from Ladell Pier, MD sent at 08/25/2018 11:33 AM EDT ----- Let pt know that her pap smear was normal.  Screen for Chlamydia/Gonorrhea and Trichomonas was neg.  Needs repeat pap in 3-5 yrs.

## 2018-08-25 NOTE — Telephone Encounter (Signed)
Medical Assistant left message on patient's home and cell voicemail. Voicemail states to give a call back to [place] with [facility] at [phone]. Patient is aware of pap being normal and repeat in 3-5 years.

## 2018-08-25 NOTE — Telephone Encounter (Signed)
Medical Assistant left message on patient's home and cell voicemail. Voicemail states to give a call back to [place] with [facility] at [phone]. Patient needs to pick up medication for yeast infection.

## 2018-08-25 NOTE — Telephone Encounter (Signed)
-----   Message from Ladell Pier, MD sent at 08/24/2018  8:06 AM EDT ----- Let patient know that vaginal specimen was positive for yeast.  I have sent a prescription to her pharmacy for Diflucan pill.

## 2018-09-06 ENCOUNTER — Other Ambulatory Visit: Payer: Self-pay | Admitting: Internal Medicine

## 2018-09-15 ENCOUNTER — Telehealth: Payer: Self-pay | Admitting: Internal Medicine

## 2018-09-15 DIAGNOSIS — M170 Bilateral primary osteoarthritis of knee: Secondary | ICD-10-CM

## 2018-09-15 NOTE — Telephone Encounter (Signed)
1) Medicaacetaminophen-codeine (TYLENOL #3) 300-30 MG tablet [[id number]]  tion(s) Requested (by name):   2) Pharmacy of Choice: [facility] [place], [place]  3) Special Requests: 90 pills refill; medicaid pre-app  Approved medications will be sent to the pharmacy, we will reach out if there is an issue.  Requests made after 3pm may not be addressed until the following business day!  If a patient is unsure of the name of the medication(s) please note and ask patient to call back when they are able to provide all info, do not send to responsible party until all information is available!

## 2018-09-16 MED ORDER — ACETAMINOPHEN-CODEINE #3 300-30 MG PO TABS: 1.0000 | ORAL_TABLET | Freq: Three times a day (TID) | ORAL | 1 refills | Status: DC | PRN

## 2018-09-16 NOTE — Telephone Encounter (Signed)
Patients tylenol 3 was called into the pharmacy.

## 2018-10-12 ENCOUNTER — Telehealth: Payer: Self-pay | Admitting: Internal Medicine

## 2018-10-12 NOTE — Telephone Encounter (Signed)
Patient called wanting to know if PCP can call in a prescription for a heavy duty shower chair and [facility] pot[facility]. Patient states she has had her current set for about 5 years. Please follow up.

## 2018-10-13 MED ORDER — MISC. DEVICES MISC: 0 refills | Status: AC

## 2018-10-13 NOTE — Telephone Encounter (Signed)
Received rx from Dr. Margarita Rana. [facility] to [facility]

## 2018-10-13 NOTE — Telephone Encounter (Signed)
Will forward to covering provider.

## 2018-10-13 NOTE — Telephone Encounter (Signed)
Done

## 2018-11-22 ENCOUNTER — Encounter: Payer: Self-pay | Admitting: Internal Medicine

## 2018-11-22 ENCOUNTER — Other Ambulatory Visit: Payer: Self-pay

## 2018-11-22 ENCOUNTER — Ambulatory Visit: Payer: Medicaid Other | Attending: Internal Medicine | Admitting: Internal Medicine

## 2018-11-22 ENCOUNTER — Telehealth: Payer: Self-pay | Admitting: Internal Medicine

## 2018-11-22 DIAGNOSIS — Z5329 Procedure and treatment not carried out because of patient's decision for other reasons: Secondary | ICD-10-CM

## 2018-11-22 DIAGNOSIS — M170 Bilateral primary osteoarthritis of knee: Secondary | ICD-10-CM

## 2018-11-22 DIAGNOSIS — Z91199 Patient's noncompliance with other medical treatment and regimen due to unspecified reason: Secondary | ICD-10-CM

## 2018-11-22 MED ORDER — NYSTATIN 100000 UNIT/GM EX POWD: Freq: Four times a day (QID) | CUTANEOUS | 5 refills | Status: DC

## 2018-11-22 MED ORDER — MUPIROCIN CALCIUM 2 % EX CREA: TOPICAL_CREAM | CUTANEOUS | 0 refills | Status: DC

## 2018-11-22 MED ORDER — SERTRALINE HCL 50 MG PO TABS: 50.0000 mg | ORAL_TABLET | Freq: Every day | ORAL | 3 refills | Status: DC

## 2018-11-22 MED ORDER — LISINOPRIL 40 MG PO TABS: 40.0000 mg | ORAL_TABLET | Freq: Every day | ORAL | 2 refills | Status: DC

## 2018-11-22 MED ORDER — TRIAMCINOLONE ACETONIDE 0.1 % EX CREA: TOPICAL_CREAM | CUTANEOUS | 1 refills | Status: DC

## 2018-11-22 MED ORDER — FUROSEMIDE 40 MG PO TABS: 40.0000 mg | ORAL_TABLET | Freq: Every day | ORAL | 3 refills | Status: DC

## 2018-11-22 MED ORDER — HYDROXYZINE HCL 50 MG PO TABS: ORAL_TABLET | ORAL | 2 refills | Status: DC

## 2018-11-22 MED ORDER — SOLIFENACIN SUCCINATE 5 MG PO TABS: 5.0000 mg | ORAL_TABLET | Freq: Every morning | ORAL | 6 refills | Status: DC

## 2018-11-22 MED ORDER — ALBUTEROL SULFATE HFA 108 (90 BASE) MCG/ACT IN AERS: 1.0000 | INHALATION_SPRAY | Freq: Four times a day (QID) | RESPIRATORY_TRACT | 6 refills | Status: AC | PRN

## 2018-11-22 MED ORDER — ATORVASTATIN CALCIUM 20 MG PO TABS: 20.0000 mg | ORAL_TABLET | Freq: Every morning | ORAL | 6 refills | Status: DC

## 2018-11-22 MED ORDER — CLONIDINE HCL 0.2 MG PO TABS: 0.2000 mg | ORAL_TABLET | Freq: Two times a day (BID) | ORAL | 6 refills | Status: DC

## 2018-11-22 MED ORDER — ISOSORBIDE MONONITRATE ER 30 MG PO TB24: 30.0000 mg | ORAL_TABLET | Freq: Every day | ORAL | 6 refills | Status: DC

## 2018-11-22 MED ORDER — PANTOPRAZOLE SODIUM 40 MG PO TBEC: 40.0000 mg | DELAYED_RELEASE_TABLET | Freq: Every day | ORAL | 5 refills | Status: DC

## 2018-11-22 MED ORDER — ALLOPURINOL 100 MG PO TABS: 100.0000 mg | ORAL_TABLET | Freq: Every morning | ORAL | 6 refills | Status: DC

## 2018-11-22 MED ORDER — DICLOFENAC SODIUM 1 % TD GEL: 4.0000 g | Freq: Four times a day (QID) | TRANSDERMAL | 3 refills | Status: DC

## 2018-11-22 NOTE — Telephone Encounter (Signed)
New Message   Pt is calling states she missed her phone call and she really needs her medication called up and to also call medicaid to approve pain meds. Please f/u

## 2018-11-22 NOTE — Progress Notes (Signed)
Patient called at 4:15 PM to do her telephone encounter.  I got her voicemail.  I left her a voicemail message letting her know that I was calling to do her encounter and that I will try her again.  I did call patient back again 10 minutes later she still did not answer.  Left message for her to call and reschedule.

## 2018-11-22 NOTE — Progress Notes (Signed)
Per patient she is retaining fluid and her body hurts Per pt she is needing pain medication Per pt she needs her paperwork completed for her to get a brand new wheelchair (per pt she got her first one 5 years ago)

## 2018-11-23 NOTE — Telephone Encounter (Signed)
Please let pt know that Dr. Wynetta Emery sent her medications yesterday

## 2018-11-24 ENCOUNTER — Ambulatory Visit: Payer: Medicaid Other | Attending: Internal Medicine | Admitting: Physician Assistant

## 2018-11-24 DIAGNOSIS — M170 Bilateral primary osteoarthritis of knee: Secondary | ICD-10-CM

## 2018-11-24 DIAGNOSIS — R609 Edema, unspecified: Secondary | ICD-10-CM | POA: Diagnosis not present

## 2018-11-24 NOTE — Progress Notes (Signed)
Virtual Visit via Telephone Note  I connected with Harvi Culver Mcelwee on 11/24/18 at  3:30 PM EDT by telephone and verified that I am speaking with the correct person using two identifiers.   I discussed the limitations, risks, security and privacy concerns of performing an evaluation and management service by telephone and the availability of in person appointments. I also discussed with the patient that there may be a patient responsible charge related to this service. The patient expressed understanding and agreed to proceed.  Patient location:  home My Location:  [place] office Persons on the call:  Me and the patient  History of Present Illness:  C/o all over body swelling over the last 4 weeks. Wants me to RF her pain meds that Dr Eloy End prescribes.  Denies CP/SOB.  Unclear if she is taking her lasix bc she says she is then says that she hasn't picked up the meds dr Wynetta Emery sent for her on 8/31.  She says "I'm out of everything."   Observations/Objective:  A&Ox3   Assessment and Plan: 1. Primary osteoarthritis of both knees Jay'a or Dr Wynetta Emery to reach out about filling pain meds.    2. Edema, unspecified type Increase lasix to bid for 3 days then back to once daily.  If worsens or develops CP/SOB, call 911.      Follow Up Instructions: See PCP as next panned   I discussed the assessment and treatment plan with the patient. The patient was provided an opportunity to ask questions and all were answered. The patient agreed with the plan and demonstrated an understanding of the instructions.   The patient was advised to call back or seek an in-person evaluation if the symptoms worsen or if the condition fails to improve as anticipated.  I provided 12 minutes of non-face-to-face time during this encounter.   Freeman Caldron, PA-C  Patient ID: Arnoldo Lenis, female   DOB: 06-20-1965, 53 y.o.   MRN: [id number]

## 2018-11-25 MED ORDER — ACETAMINOPHEN-CODEINE #3 300-30 MG PO TABS: 1.0000 | ORAL_TABLET | Freq: Three times a day (TID) | ORAL | 0 refills | Status: DC | PRN

## 2018-11-25 NOTE — Telephone Encounter (Signed)
Follow up ° °Pt returning call for nurse °

## 2018-11-25 NOTE — Telephone Encounter (Signed)
Spoke with my CMA regarding pt.  I gave instructions for pt to be seen in ER given her symptoms of fluid build up and chest pains.

## 2018-11-25 NOTE — Telephone Encounter (Signed)
Contacted pt to go over Dr. Wynetta Emery response pt didn't answer lvm asking pt to give me a call at her earliest convenience

## 2018-11-25 NOTE — Telephone Encounter (Signed)
Pt states she wasn't out of the lasix. Pt states she has been taking the lasix as prescribed.  Pt states the lasix pill doesn't work for her. Pt states usually she has to get an iv to help with the swelling.  Pt states her bra is tighter than what it usually is. Pt states her feet, legs and arms are all swollen. Pt states she can barely walk because her knees hurts. Pt states her ankles are swollen as well.   Pt states she has been having chest pain for 2-3 weeks or longer. Pt states she has been having numbness and tingling in her legs and lungs and arms.   After pt has explained everything to me I have advised pt to be seen at the ED for further evaluation regarding the chest pain, numbness/tingling and for the fluid build up.   Pt states she wants me to the explain to her caregiver what I have advised. Pt states her caregiver gets her to the appointments, he feed her and takes care of her  The caregiver got on the phone and I asked who am I speaking to he states "the caregiver" I explained to him that I will need his name so I can document that I spoke to him. He then to the phone away from his ear and starting cussing stating " why the F do I need to know his name" "why the F I can't just give him the inofrmation"   He then gave the phone back to pt. I explained to pt that I have to document who I spoke with.   Pt states she will try to go to the ED today. Pt states if she doesn't go today she will go tomorrow.   Pt was more concerned regarding her Tylenol 3.

## 2018-11-26 ENCOUNTER — Ambulatory Visit: Payer: Medicaid Other | Admitting: Internal Medicine

## 2018-12-01 ENCOUNTER — Telehealth: Payer: Self-pay | Admitting: *Deleted

## 2018-12-01 ENCOUNTER — Other Ambulatory Visit: Payer: Self-pay | Admitting: Internal Medicine

## 2018-12-01 ENCOUNTER — Telehealth: Payer: Self-pay

## 2018-12-01 NOTE — Telephone Encounter (Signed)
Patient came to the office to drop off paperwork for power wheel chair. She request that paperwork be completed today. Patient informed that paperwork would go to the PCP mailbox and would faxed upon completion.  Patient was given a copy of the original forms.   Patient spoke to both Probation officer and Opal Sidles about forms.

## 2018-12-01 NOTE — Telephone Encounter (Signed)
Met with the patient when she came to the clinic today.  She had paperwork for a power chair that she wanted completed today. Informed her that is not possible. She will need to be assessed by a provider and there is specific documentation that is required for the application. She was insistent that the application could be completed without an assessment.   Informed her that she has an appointment with a provider on 12/17/2018 and a note will be made with her appointment indicating the need for powerchair assessment.

## 2018-12-04 ENCOUNTER — Other Ambulatory Visit: Payer: Self-pay | Admitting: Internal Medicine

## 2018-12-06 ENCOUNTER — Other Ambulatory Visit: Payer: Self-pay | Admitting: Internal Medicine

## 2018-12-06 NOTE — Telephone Encounter (Signed)
Follow up  1) Medication(s) Requested (by name):nystatin Cream  2) Pharmacy of Choice: [facility]  3) Special Requests:   Approved medications will be sent to the pharmacy, we will reach out if there is an issue.  Requests made after 3pm may not be addressed until the following business day!  If a patient is unsure of the name of the medication(s) please note and ask patient to call back when they are able to provide all info, do not send to responsible party until all information is available!

## 2018-12-17 ENCOUNTER — Encounter: Payer: Self-pay | Admitting: Family Medicine

## 2018-12-17 ENCOUNTER — Other Ambulatory Visit: Payer: Self-pay

## 2018-12-17 ENCOUNTER — Ambulatory Visit: Payer: Medicaid Other | Attending: Family Medicine | Admitting: Family Medicine

## 2018-12-17 VITALS — BP 142/74 | HR 84 | Temp 97.9°F | Ht 65.0 in | Wt 392.0 lb

## 2018-12-17 DIAGNOSIS — R0609 Other forms of dyspnea: Secondary | ICD-10-CM | POA: Diagnosis not present

## 2018-12-17 DIAGNOSIS — Z8249 Family history of ischemic heart disease and other diseases of the circulatory system: Secondary | ICD-10-CM | POA: Insufficient documentation

## 2018-12-17 DIAGNOSIS — I130 Hypertensive heart and chronic kidney disease with heart failure and stage 1 through stage 4 chronic kidney disease, or unspecified chronic kidney disease: Secondary | ICD-10-CM | POA: Diagnosis not present

## 2018-12-17 DIAGNOSIS — M1A09X0 Idiopathic chronic gout, multiple sites, without tophus (tophi): Secondary | ICD-10-CM

## 2018-12-17 DIAGNOSIS — Z6841 Body Mass Index (BMI) 40.0 and over, adult: Secondary | ICD-10-CM | POA: Insufficient documentation

## 2018-12-17 DIAGNOSIS — J9611 Chronic respiratory failure with hypoxia: Secondary | ICD-10-CM | POA: Diagnosis not present

## 2018-12-17 DIAGNOSIS — E785 Hyperlipidemia, unspecified: Secondary | ICD-10-CM | POA: Insufficient documentation

## 2018-12-17 DIAGNOSIS — Z791 Long term (current) use of non-steroidal anti-inflammatories (NSAID): Secondary | ICD-10-CM | POA: Diagnosis not present

## 2018-12-17 DIAGNOSIS — J9621 Acute and chronic respiratory failure with hypoxia: Secondary | ICD-10-CM

## 2018-12-17 DIAGNOSIS — Z79899 Other long term (current) drug therapy: Secondary | ICD-10-CM | POA: Insufficient documentation

## 2018-12-17 DIAGNOSIS — K219 Gastro-esophageal reflux disease without esophagitis: Secondary | ICD-10-CM | POA: Insufficient documentation

## 2018-12-17 DIAGNOSIS — I5032 Chronic diastolic (congestive) heart failure: Secondary | ICD-10-CM

## 2018-12-17 DIAGNOSIS — Z9981 Dependence on supplemental oxygen: Secondary | ICD-10-CM | POA: Diagnosis not present

## 2018-12-17 DIAGNOSIS — G4733 Obstructive sleep apnea (adult) (pediatric): Secondary | ICD-10-CM | POA: Insufficient documentation

## 2018-12-17 DIAGNOSIS — G43909 Migraine, unspecified, not intractable, without status migrainosus: Secondary | ICD-10-CM | POA: Insufficient documentation

## 2018-12-17 DIAGNOSIS — M1A9XX0 Chronic gout, unspecified, without tophus (tophi): Secondary | ICD-10-CM | POA: Insufficient documentation

## 2018-12-17 DIAGNOSIS — J45909 Unspecified asthma, uncomplicated: Secondary | ICD-10-CM

## 2018-12-17 DIAGNOSIS — J9612 Chronic respiratory failure with hypercapnia: Secondary | ICD-10-CM | POA: Diagnosis not present

## 2018-12-17 DIAGNOSIS — F419 Anxiety disorder, unspecified: Secondary | ICD-10-CM | POA: Insufficient documentation

## 2018-12-17 DIAGNOSIS — Z87891 Personal history of nicotine dependence: Secondary | ICD-10-CM | POA: Diagnosis not present

## 2018-12-17 DIAGNOSIS — M170 Bilateral primary osteoarthritis of knee: Secondary | ICD-10-CM

## 2018-12-17 DIAGNOSIS — J9622 Acute and chronic respiratory failure with hypercapnia: Secondary | ICD-10-CM

## 2018-12-17 DIAGNOSIS — Z7409 Other reduced mobility: Secondary | ICD-10-CM

## 2018-12-17 NOTE — Progress Notes (Signed)
Established Patient Office Visit  Subjective:  Patient ID: Kara Vincent, female    DOB: 03-Jul-1965  Age: 53 y.o. MRN: [id number]  CC:  Chief Complaint  Patient presents with  . Form Completion  . body swelling  . Migraine  . Medication Refill  Discussed with the patient that her examination today was for face-to-face encounter for mobility examination.  Patient requested refill of Tylenol 3 and she was informed that refill of this medication is not due until October 3.  She was asked to make follow-up visit with her primary care provider for all other concerns.  Patient then stated that she felt as if she needed to be admitted to the hospital due to shortness of breath and patient was advised to go to the emergency department after today's visit for further evaluation to see if she had issues which required hospitalization.  HPI Kara Vincent presents for face to face mobility evaluation for a power wheelchair. She currently has a powered wheelchair which she is using at today's visit.  She reports that she cannot use a cane or walker due to pain/weakness in her hands from arthritis.  She also reports that she tires easily and gets very out of breath with attempts at ambulation.  She currently uses a cane for very short distances in her home such as going from her bedroom to her bathroom but she states that even this amount of activity causes her to feel short of breath.  She reports that she has difficulty with bathing/toileting/cooking and meal preparation which are currently handled by an aide who comes to her home.  Patient reports that due to her morbid obesity, joint pain including osteoarthritis of the knees and her CHF with swelling of her legs that shortness of breath with activity, that she cannot prepare her own meals as she cannot stand up for very long in order to cook.  She states that she mostly gets around inside of her home with the use of her powered wheelchair.   Additionally she cannot use a manual wheelchair unless someone else is available to push the chair as her weight, arthritis in her hands and shortness of breath with activity due to her breathing issues and CHF make it impossible for her to self propel a manual wheelchair.      Patient was asked if she has had any falls within the past 12 months.  Patient reports that she did have one fall at home which consisted of falling out of her bed when she misjudged her position.  Patient states that she does feel as if she will fall when she has to ambulate when not using her powered wheelchair.  She again reports that she only ambulates for very short distances with the use of a cane from her bedroom to her bathroom.         Patient reports that she feels as if she has had recent increase swelling in her lower legs and increased shortness of breath and she believes that she needs to be hospitalized.  She also reports chronic pain especially in her hands and lower back and requests refill of Tylenol 3 which is normally provided by her primary care provider.  Patient also request medication that she was given in the past by Dr. [name] to help with her migraine headaches but she cannot recall the exact name of the medication.  She states that in the past, Dr. [name] gave her a certain medicine and also  gave her injections in his office to help with her headache pain from migraines.  Patient also reports that she has issues with urinary incontinence especially because she has difficulty getting to the restroom in time.  She is currently on Vesicare but patient states that she would like to try different medication. (She was again reminded that today's visit was for a mobility examination for a power wheelchair or she could have visit in follow-up of her current complaints and patient wished to have the mobility examination.  I explained to the patient that she can make a follow-up appointment with her primary care doctor  and/or leave a message regarding the issues she is having.  Patient was in no acute distress, she was not short of breath and her pulse ox on room air was 97% without the use of any oxygen.  She was also able to talk fluently without any issues with shortness of breath.  She was told that if she is feeling that she is having more shortness of breath and increased swelling that she can follow-up with the emergency department after today's visit)  Past Medical History:  Diagnosis Date  . Allergy   . Anxiety   . Arthritis   . Asthma   . Asthma   . CHF (congestive heart failure) ([place])   . CHF (congestive heart failure) ([place])   . Chronic abdominal pain   . Chronic kidney disease   . Chronic pain    "all over"  . 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 GERD (gastroesophageal reflux disease)   . Gout   . Hidradenitis   . HLD (hyperlipidemia) 12/14/2015  . Homelessness   . Hyperlipemia   . Hypertension   . Low back pain   . Morbid obesity ([place])    uses motor wheel chair  . MRSA (methicillin resistant Staphylococcus aureus)    states about a year ago  . On home O2    3L N/C O2 continuously  . OSA (obstructive sleep apnea)    non-compliant with CPAP  . Oxygen deficiency   . Renal insufficiency   . Sleep apnea     Past Surgical History:  Procedure Laterality Date  . BACK SURGERY    . CESAREAN SECTION    . CESAREAN SECTION    . FRACTURE SURGERY    . TUBAL LIGATION      Family History  Problem Relation Age of Onset  . Asthma Mother   . Diabetes Father   . Stroke Father   . Heart disease Father   . Arthritis Sister   . Asthma Sister   . Asthma Daughter   . Asthma Son   . Arthritis Sister   . Asthma Sister     Social History   Socioeconomic History  . Marital status: Single    Spouse name: Not on file  . Number of children: Not on file  . Years of education: Not on file  . Highest education level: Not on file  Occupational History  . Not on file    Social Needs  . Financial resource strain: Not on file  . Food insecurity    Worry: Not on file    Inability: Not on file  . Transportation needs    Medical: Not on file    Non-medical: Not on file  Tobacco Use  . Smoking status: Former Smoker    Types: Cigarettes    Quit date: 03/25/2003    Years  since quitting: 15.7  . Smokeless tobacco: Former Systems developer    Quit date: 05/27/1978  Substance and Sexual Activity  . Alcohol use: Yes    Alcohol/week: 0.0 standard drinks    Comment: years ago  no longer beer only  early 20's  . Drug use: Yes    Types: "Crack" cocaine, Other-see comments    Comment: years  ago  . Sexual activity: Yes    Birth control/protection: Other-see comments, None, Abstinence  Lifestyle  . Physical activity    Days per week: Not on file    Minutes per session: Not on file  . Stress: Not on file  Relationships  . Social Herbalist on phone: Not on file    Gets together: Not on file    Attends religious service: Not on file    Active member of club or organization: Not on file    Attends meetings of clubs or organizations: Not on file    Relationship status: Not on file  . Intimate partner violence    Fear of current or ex partner: Not on file    Emotionally abused: Not on file    Physically abused: Not on file    Forced sexual activity: Not on file  Other Topics Concern  . Not on file  Social History Narrative   ** Merged History Encounter **       Patient has exhausted [facility] financial resources. She does have access to [employer] and [employer]. She should be redirected to purse disability with her attorney and reapply of Medicaid    Outpatient Medications Prior to Visit  Medication Sig Dispense Refill  . acetaminophen-codeine (TYLENOL #3) 300-30 MG tablet Take 1 tablet by mouth every 8 (eight) hours as needed for moderate pain (Fill on or after June 29th/2000.  Each prescription to last 1 month). Each prescription to last one month 90  tablet 0  . albuterol (VENTOLIN HFA) 108 (90 Base) MCG/ACT inhaler Inhale 1-2 puffs into the lungs every 6 (six) hours as needed for wheezing or shortness of breath. 8 g 6  . allopurinol (ZYLOPRIM) 100 MG tablet Take 1 tablet (100 mg total) by mouth every morning. 30 tablet 6  . atorvastatin (LIPITOR) 20 MG tablet Take 1 tablet (20 mg total) by mouth every morning. 30 tablet 6  . cloNIDine (CATAPRES) 0.2 MG tablet Take 1 tablet (0.2 mg total) by mouth 2 (two) times daily. 60 tablet 6  . diclofenac sodium (VOLTAREN) 1 % GEL Apply 4 g topically 4 (four) times daily. 100 g 3  . furosemide (LASIX) 40 MG tablet Take 1 tablet (40 mg total) by mouth daily. 1 tab po daily 60 tablet 3  . gabapentin (NEURONTIN) 100 MG capsule Take 100 mg by mouth 3 (three) times daily.    . hydrOXYzine (ATARAX/VISTARIL) 25 MG tablet TAKE ONE TABLET BY MOUTH THREE TIMES DAILY AS NEEDED FOR ANXIETY OR FOR ITCHING 90 tablet 2  . hydrOXYzine (ATARAX/VISTARIL) 50 MG tablet TAKE 1 TABLET BY MOUTH THREE TIMES DAILY AS NEEDED FOR ANXIETY/ITCH 60 tablet 2  . isosorbide mononitrate (IMDUR) 30 MG 24 hr tablet Take 1 tablet (30 mg total) by mouth daily. 30 tablet 6  . lisinopril (ZESTRIL) 40 MG tablet TAKE ONE TABLET BY MOUTH EVERY DAY 30 tablet 0  . Misc. Devices MISC Please provide patient with a raised toilet seat that is covered under her current insurance plan. 1 each 0  . Misc. Devices MISC Heavy duty  shower chair; Heavy duty potty chair. Dx: Osteoarthritis 1 each 0  . mupirocin cream (BACTROBAN) 2 % Apply to affected area twice a day 15 g 0  . nystatin (MYCOSTATIN/NYSTOP) powder Apply topically 4 (four) times daily. Apply beneath breast and below abdominal fold 60 g 5  . nystatin cream (MYCOSTATIN) APPLY TOPICALLY TO THE AFFECTED AREA(S) TWICE DAILY 60 g 0  . pantoprazole (PROTONIX) 40 MG tablet Take 1 tablet (40 mg total) by mouth daily. 30 tablet 5  . sertraline (ZOLOFT) 50 MG tablet Take 1 tablet (50 mg total) by mouth daily.  30 tablet 3  . solifenacin (VESICARE) 5 MG tablet Take 1 tablet (5 mg total) by mouth every morning. 30 tablet 6  . triamcinolone cream (KENALOG) 0.1 % APPLY TOPICALLY TO THE rash on your Back WICE DAILY 80 g 1  . fluconazole (DIFLUCAN) 150 MG tablet 1 tab PO once a week (Patient not taking: Reported on 11/22/2018) 3 tablet 0  . ipratropium-albuterol (DUONEB) 0.5-2.5 (3) MG/3ML SOLN Take 3 mLs by nebulization every 6 (six) hours as needed (SOB). 3 mL 0   No facility-administered medications prior to visit.     Allergies  Allergen Reactions  . Ibuprofen Hives, Shortness Of Breath, Palpitations and Rash    Has tolerated toradol 12/2013 inpatient  . Penicillins Anaphylaxis, Hives and Rash    Has patient had a PCN reaction causing immediate rash, facial/tongue/throat swelling, SOB or lightheadedness with hypotension: Yes Has patient had a PCN reaction causing severe rash involving mucus membranes or skin necrosis: Yes Has patient had a PCN reaction that required hospitalization Unknown Has patient had a PCN reaction occurring within the last 10 years: Unknown If all of the above answers are "NO", then may proceed with Cephalosporin use.   . Sulfa Antibiotics Hives, Shortness Of Breath, Rash and Cough  . Adhesive [Tape] Rash  . Doxycycline Hives, Swelling and Rash  . Sulfamethoxazole-Trimethoprim Rash  . Zithromax [Azithromycin] Hives, Swelling and Rash    ROS Review of Systems  Constitutional: Positive for fatigue. Negative for chills, diaphoresis and fever.  HENT: Negative for sore throat and trouble swallowing.   Eyes: Negative for photophobia and visual disturbance.  Respiratory: Positive for shortness of breath. Negative for cough.   Cardiovascular: Positive for leg swelling. Negative for chest pain and palpitations.  Gastrointestinal: Negative for abdominal pain, constipation, diarrhea and nausea.  Endocrine: Negative for polydipsia, polyphagia and polyuria.  Genitourinary:  Positive for difficulty urinating (Issues with urinary incontinence). Negative for dysuria and frequency.  Musculoskeletal: Positive for arthralgias, back pain, gait problem and myalgias.  Neurological: Positive for weakness (Reports pain and weakness in her legs if she attempts to stand for any significant period of time) and headaches (Occur occasionally). Negative for dizziness.  Hematological: Negative for adenopathy. Does not bruise/bleed easily.      Objective:    Physical Exam  Constitutional: She is oriented to person, place, and time. She appears well-developed and well-nourished.  Morbidly obese female in no acute distress sitting in a bariatric sized powered wheelchair  Neck: Normal range of motion. Neck supple. No JVD present.  Cardiovascular: Normal rate and regular rhythm.  Heart sounds were somewhat distant likely secondary to body habitus/morbid obesity  Pulmonary/Chest: Effort normal and breath sounds normal. No respiratory distress. She has no wheezes. She has no rales.  No evidence of respiratory distress or increased work of breathing.  Patient was able to hold a conversation without stopping due to shortness of breath.  Patient  was not wearing oxygen at today's visit.  No accessory muscle use.  Abdominal: Soft. There is no abdominal tenderness. There is no rebound and no guarding.  Truncal obesity  Musculoskeletal:        General: Tenderness present. No edema (Patient's lower extremities while obese did not have any edema.  Lower extremities were soft to palpation).     Comments: Patient with complaint of lumbosacral tenderness to palpation.  No CVA tenderness.  Patient was complaining of bilateral knee joint line tenderness to palpation.  Examination of the hands did not reveal any deformity, no Heberden's nodes and no ulnar deviation.  Patient with good grip strength bilaterally but poor effort.  Similarly patient did not give good effort with testing of upper or lower  extremity strength but even with poor effort she had at least 4 out of 5 strength in all extremities and strength is likely 5/5 with appropriate effort.  She did have some loss of range of motion of the lower extremities due to her obesity  Lymphadenopathy:    She has no cervical adenopathy.  Neurological: She is alert and oriented to person, place, and time. No cranial nerve deficit.  Skin: Skin is warm and dry.  Psychiatric:  Patient with slightly flattened affect and had multiple complaints that were inconsistent with physical examination  Nursing note and vitals reviewed.   BP (!) 142/74 (BP Location: Left Arm, Patient Position: Sitting, Cuff Size: Large)   Pulse 84   Temp 97.9 F (36.6 C) (Oral)   Ht 5\' 5"  (1.651 m)   Wt (!) 392 lb (177.8 kg)   LMP 03/24/2009   SpO2 97%   BMI 65.23 kg/m  Wt Readings from Last 3 Encounters:  12/17/18 (!) 392 lb (177.8 kg)  01/05/18 (!) 385 lb 6.4 oz (174.8 kg)  12/01/17 (!) 386 lb (175.1 kg)     Health Maintenance Due  Topic Date Due  . TETANUS/TDAP  03/27/1983  . MAMMOGRAM  03/26/2014  . COLONOSCOPY  03/26/2014  . INFLUENZA VACCINE  10/23/2018    Lab Results  Component Value Date   TSH 5.704 (H) 05/09/2012   Lab Results  Component Value Date   WBC 9.0 04/06/2018   HGB 11.0 (L) 04/06/2018   HCT 38.3 04/06/2018   MCV 103.5 (H) 04/06/2018   PLT 219 04/06/2018   Lab Results  Component Value Date   NA 144 05/21/2018   K 3.9 05/21/2018   CO2 26 05/21/2018   GLUCOSE 85 05/21/2018   BUN 19 05/21/2018   CREATININE 1.39 (H) 05/21/2018   BILITOT <0.2 03/29/2018   ALKPHOS 61 03/29/2018   AST 9 03/29/2018   ALT 7 03/29/2018   PROT 6.6 03/29/2018   ALBUMIN 3.9 03/29/2018   CALCIUM 9.1 05/21/2018   ANIONGAP 10 04/06/2018   Lab Results  Component Value Date   CHOL 190 12/01/2017   Lab Results  Component Value Date   HDL 37 (L) 12/01/2017   Lab Results  Component Value Date   LDLCALC 101 (H) 12/01/2017   Lab Results    Component Value Date   TRIG 261 (H) 12/01/2017   Lab Results  Component Value Date   CHOLHDL 5.1 (H) 12/01/2017   Lab Results  Component Value Date   HGBA1C 5.5 12/01/2017      Assessment & Plan:  1. Primary osteoarthritis of both knees 2. Chronic diastolic heart failure ([place]) 3. Chronic gout of multiple sites, unspecified cause 4. Dyspnea on exertion 5.  chronic  respiratory failure with hypoxia and hypercapnia (H[facility]) 6. Moderate asthma without complication, unspecified whether persistent 7. Morbid obesity ([place]) 8. Impaired mobility Patient with face-to-face encounter for mobility evaluation for power wheelchair at today's visit.  Patient does have multiple medical issues which likely necessitate the need for powered mobility device to help patient with ADLs and fall prevention.  Patient has primary osteoarthritis of both knees with chronic pain as well as complaint of low back pain with contribute to her gait impairment.  Additionally she has a history of gout though no evidence of acute gouty tophi or joint swelling indicative of acute gout flareup.  She expresses issues with osteoarthritis/hand pain which prevents use of a cane or walker in addition to patient's morbid obesity and arthritis of the knees and back pain which would also make use of a cane or walker more difficult.  Patient additionally with a history of chronic respiratory failure and CHF which caused patient to have shortness of breath with exertion for which patient benefits from the use of a powered wheelchair for mobility in addition to asthma which may also affect her breathing.  Paperwork had been received from a medical supply company regarding patient's power wheelchair.  Discussed with the patient that I would need to complete her note and would then also complete the prescription for her power mobility device.  Patient however insisted that the prescription for the powered mobility device be completed and she stated  that she was told by the medical supply company that she needed to see the prescription faxed while she was still in the office.  Prescription for powered wheelchair was completed and given to the [place] with instructions that patient requested to see blood prescription faxed before patient left the clinic.  CMA was also notified of patient's other complaints and concerns as the CMA who assisted me at today's visit is also the [place] for patient's primary care provider.  CMA was also made aware to reiterate to patient that patient's request for refill of Tylenol 3 was too early as the medication is not due for renewal until October 3.  CMA was also made aware that patient had been instructed to go to the emergency department after today's visit due to patient's complaints of feeling as if she needed to be hospitalized due to shortness of breath and increased swelling however patient did not have any evidence on examination of respiratory difficulty and no peripheral edema and that pulse oximetry reading was 97%.  CMA was also made aware that patient should schedule follow-up with her primary care physician regarding patient's other complaints of wanting to have a change in medication from her current Vesicare to another medication to help with urinary incontinence.  Patient also requested a medication that she was prescribed in the past by Dr. [name] to help with migraine headaches.  An After Visit Summary was printed and given to the patient.  Follow-up: No follow-ups on file.    Antony Blackbird, MD

## 2018-12-20 ENCOUNTER — Telehealth: Payer: Self-pay | Admitting: Internal Medicine

## 2018-12-20 NOTE — Telephone Encounter (Signed)
New Message   Pt is calling states she was suppose to have some paperwork faxed over for her wheelchair when she came to her appt on Friday. Pt states its urgent. Please f/u

## 2018-12-21 ENCOUNTER — Telehealth: Payer: Self-pay | Admitting: Internal Medicine

## 2018-12-21 DIAGNOSIS — M170 Bilateral primary osteoarthritis of knee: Secondary | ICD-10-CM

## 2018-12-21 NOTE — Telephone Encounter (Signed)
Please contact patient and update her on paperwork for her powered wheelchair

## 2018-12-21 NOTE — Telephone Encounter (Signed)
1) Medication(s) Requested (by name): -acetaminophen-codeine (TYLENOL #3) 300-30 MG tablet   2) Pharmacy of Choice: -[place] [facility]

## 2018-12-22 MED ORDER — ACETAMINOPHEN-CODEINE #3 300-30 MG PO TABS: 1.0000 | ORAL_TABLET | Freq: Three times a day (TID) | ORAL | 0 refills | Status: DC | PRN

## 2018-12-22 NOTE — Telephone Encounter (Signed)
RF sent on Tyl#3 #90 to fill on 12/24/2018.

## 2018-12-22 NOTE — Telephone Encounter (Signed)
Patient was called and a voicemail was left informing patient that paperwork has been faxed over

## 2018-12-23 ENCOUNTER — Other Ambulatory Visit: Payer: Self-pay | Admitting: Internal Medicine

## 2018-12-23 ENCOUNTER — Telehealth: Payer: Self-pay | Admitting: Internal Medicine

## 2018-12-23 DIAGNOSIS — M170 Bilateral primary osteoarthritis of knee: Secondary | ICD-10-CM

## 2018-12-23 NOTE — Telephone Encounter (Signed)
Please send office note from patient's visit for mobility exam

## 2018-12-23 NOTE — Telephone Encounter (Signed)
Provider Walker Kehr with [employer] called stating they received a prescription for the patients wheelchair from the [place] from 12-17-2018 with Dr.Fulp but is needing the OV chart notes from that visit. Please follow up.

## 2018-12-24 NOTE — Telephone Encounter (Signed)
Office note was faxed over

## 2018-12-28 ENCOUNTER — Other Ambulatory Visit: Payer: Self-pay | Admitting: Internal Medicine

## 2019-01-14 ENCOUNTER — Other Ambulatory Visit: Payer: Self-pay | Admitting: Internal Medicine

## 2019-01-14 DIAGNOSIS — M170 Bilateral primary osteoarthritis of knee: Secondary | ICD-10-CM

## 2019-01-24 ENCOUNTER — Ambulatory Visit: Payer: Medicaid Other | Attending: Internal Medicine | Admitting: Internal Medicine

## 2019-01-24 ENCOUNTER — Encounter: Payer: Self-pay | Admitting: Internal Medicine

## 2019-01-24 ENCOUNTER — Other Ambulatory Visit: Payer: Self-pay

## 2019-01-24 VITALS — BP 138/65 | HR 61 | Temp 98.4°F | Resp 16

## 2019-01-24 DIAGNOSIS — N184 Chronic kidney disease, stage 4 (severe): Secondary | ICD-10-CM | POA: Diagnosis not present

## 2019-01-24 DIAGNOSIS — E662 Morbid (severe) obesity with alveolar hypoventilation: Secondary | ICD-10-CM | POA: Insufficient documentation

## 2019-01-24 DIAGNOSIS — E785 Hyperlipidemia, unspecified: Secondary | ICD-10-CM | POA: Diagnosis not present

## 2019-01-24 DIAGNOSIS — M170 Bilateral primary osteoarthritis of knee: Secondary | ICD-10-CM | POA: Insufficient documentation

## 2019-01-24 DIAGNOSIS — M109 Gout, unspecified: Secondary | ICD-10-CM | POA: Insufficient documentation

## 2019-01-24 DIAGNOSIS — L304 Erythema intertrigo: Secondary | ICD-10-CM | POA: Insufficient documentation

## 2019-01-24 DIAGNOSIS — I10 Essential (primary) hypertension: Secondary | ICD-10-CM | POA: Diagnosis not present

## 2019-01-24 DIAGNOSIS — I130 Hypertensive heart and chronic kidney disease with heart failure and stage 1 through stage 4 chronic kidney disease, or unspecified chronic kidney disease: Secondary | ICD-10-CM | POA: Diagnosis not present

## 2019-01-24 DIAGNOSIS — I5032 Chronic diastolic (congestive) heart failure: Secondary | ICD-10-CM | POA: Insufficient documentation

## 2019-01-24 DIAGNOSIS — I5082 Biventricular heart failure: Secondary | ICD-10-CM | POA: Diagnosis not present

## 2019-01-24 DIAGNOSIS — Z8249 Family history of ischemic heart disease and other diseases of the circulatory system: Secondary | ICD-10-CM | POA: Insufficient documentation

## 2019-01-24 DIAGNOSIS — N1831 Chronic kidney disease, stage 3a: Secondary | ICD-10-CM

## 2019-01-24 DIAGNOSIS — G894 Chronic pain syndrome: Secondary | ICD-10-CM | POA: Insufficient documentation

## 2019-01-24 DIAGNOSIS — I350 Nonrheumatic aortic (valve) stenosis: Secondary | ICD-10-CM | POA: Diagnosis not present

## 2019-01-24 DIAGNOSIS — Z87891 Personal history of nicotine dependence: Secondary | ICD-10-CM | POA: Insufficient documentation

## 2019-01-24 DIAGNOSIS — G4733 Obstructive sleep apnea (adult) (pediatric): Secondary | ICD-10-CM

## 2019-01-24 DIAGNOSIS — F419 Anxiety disorder, unspecified: Secondary | ICD-10-CM | POA: Diagnosis not present

## 2019-01-24 DIAGNOSIS — Z79899 Other long term (current) drug therapy: Secondary | ICD-10-CM | POA: Diagnosis not present

## 2019-01-24 DIAGNOSIS — K219 Gastro-esophageal reflux disease without esophagitis: Secondary | ICD-10-CM | POA: Diagnosis not present

## 2019-01-24 MED ORDER — NYSTATIN 100000 UNIT/GM EX POWD: Freq: Four times a day (QID) | CUTANEOUS | 5 refills | Status: DC

## 2019-01-24 MED ORDER — DULOXETINE HCL 20 MG PO CPEP: 20.0000 mg | ORAL_CAPSULE | Freq: Every day | ORAL | 3 refills | Status: DC

## 2019-01-24 MED ORDER — ACETAMINOPHEN-CODEINE #3 300-30 MG PO TABS: 1.0000 | ORAL_TABLET | Freq: Three times a day (TID) | ORAL | 0 refills | Status: DC | PRN

## 2019-01-24 MED FILL — NYSTOP 100,000 UNITS/GM PWD: 100000 | 30 days supply | Qty: 60 | Fill #0

## 2019-01-24 MED FILL — ACETAMINOPHEN/COD #3 TABLET: 300-30 | 7 days supply | Qty: 21 | Fill #0 | Status: TO

## 2019-01-24 MED FILL — DULoxetine HCL 20 MG CPEP: 20 | 60 days supply | Qty: 60 | Fill #0

## 2019-01-24 NOTE — Progress Notes (Signed)
Pt is requesting pain medicine to be sent to [facility]  Pt is requesting that the nystatin cream and powder be sent to [place]

## 2019-01-24 NOTE — Patient Instructions (Signed)
Stop Sertaline (Zoloft).

## 2019-01-24 NOTE — Progress Notes (Signed)
Patient ID: Kara Vincent, female    DOB: [birth year]  MRN: [id number]  CC: Follow-up, Leg Swelling (b/l), and Edema (b/l arm)   Subjective: Kara Vincent is a 53 y.o. female who presents for chronic ds management Her concerns today include:  Patient with history of asthma, OSA,HTN,dCHF, aortic stenosis and CKD stage 3-4,anxiety,morbid obesity, chronic pain syndrome, gout,urge incontinence, OA knees and back  Patient complains of having "fluid all over my body and swollen in her arms stomach and legs."  She thinks she needs to be hospitalized to have some of the fluid removed.  She endorses compliance with taking furosemide and tells me that she makes good urine when she takes it.  Denies any increased shortness of breath at rest or at nights.  No PND. Reports compliance with blood pressure medications including isosorbide, clonidine, and lisinopril.  Of note lisinopril was not on her med list today but patient states she has been taking the lisinopril 40 mg.  I note that it was on her med list when I last saw her..  She would like to be placed on Indocin.  She tells me that she took it in the past and it helps a lot with her knee pain.  She takes the Tylenol with codeine as prescribed but sometimes feel it is not enough.  She has not had any falls.  She recently got a new motorized wheelchair which she is seen today.  OSA: She never did get set up with the sleep study.  She tells me that she had called the sleep lab at [facility] long but never could get an answer.  She is requesting refill on nystatin cream and powder to be used for intertrigo in the abdominal folds and upper thigh  She is still staying at a motel.  She has limited means of transportation.  She rides the bus.  Patient Active Problem List   Diagnosis Date Noted  . Morbid obesity ([place]) 12/01/2017  . Primary osteoarthritis of both knees 12/01/2017  . Anxiety 12/01/2017  . Impaired mobility 12/01/2017  . Essential  hypertension 12/14/2015  . HLD (hyperlipidemia) 12/14/2015  . GERD (gastroesophageal reflux disease) 12/14/2015  . Gout 12/14/2015  . Darier's disease   . Leukocytosis   . 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 Right-sided heart failure ([place]) 11/20/2014  . OSA (obstructive sleep apnea)   . CKD (chronic kidney disease) stage 3, GFR 30-59 ml/min (HCC) 11/17/2014  . Asthma exacerbation 11/16/2014  . Venous stasis 11/16/2014  . Acute on chronic respiratory failure with hypoxia and hypercapnia ([place]) 11/16/2014  . Chronic pain 08/24/2011  . Obesity hypoventilation syndrome ([place]) 08/24/2011     Current Outpatient Medications on File Prior to Visit  Medication Sig Dispense Refill  . albuterol (VENTOLIN HFA) 108 (90 Base) MCG/ACT inhaler Inhale 1-2 puffs into the lungs every 6 (six) hours as needed for wheezing or shortness of breath. 8 g 6  . allopurinol (ZYLOPRIM) 100 MG tablet Take 1 tablet (100 mg total) by mouth every morning. 30 tablet 6  . atorvastatin (LIPITOR) 20 MG tablet Take 1 tablet (20 mg total) by mouth every morning. 30 tablet 6  . cloNIDine (CATAPRES) 0.2 MG tablet Take 1 tablet (0.2 mg total) by mouth 2 (two) times daily. 60 tablet 6  . diclofenac sodium (VOLTAREN) 1 % GEL Apply 4 g topically 4 (four) times daily. 100 g 3  . furosemide (LASIX) 40 MG tablet Take 1 tablet (40 mg total) by mouth daily. 1  tab po daily 60 tablet 3  . gabapentin (NEURONTIN) 100 MG capsule Take 100 mg by mouth 3 (three) times daily.    . hydrOXYzine (ATARAX/VISTARIL) 25 MG tablet TAKE ONE TABLET BY MOUTH THREE TIMES DAILY AS NEEDED FOR ANXIETY OR FOR ITCHING 90 tablet 2  . hydrOXYzine (ATARAX/VISTARIL) 50 MG tablet TAKE 1 TABLET BY MOUTH THREE TIMES DAILY AS NEEDED FOR ANXIETY/ITCH 60 tablet 2  . ipratropium-albuterol (DUONEB) 0.5-2.5 (3) MG/3ML SOLN Take 3 mLs by nebulization every 6 (six) hours as needed (SOB). 3 mL 0  . isosorbide mononitrate (IMDUR) 30 MG 24 hr tablet Take 1 tablet (30 mg total) by mouth  daily. 30 tablet 6  . lisinopril (ZESTRIL) 40 MG tablet TAKE 1 TABLET(40 MG) BY MOUTH DAILY 30 tablet 0  . Misc. Devices MISC Please provide patient with a raised toilet seat that is covered under her current insurance plan. 1 each 0  . Misc. Devices MISC Heavy duty shower chair; Heavy duty potty chair. Dx: Osteoarthritis 1 each 0  . mupirocin cream (BACTROBAN) 2 % Apply to affected area twice a day 15 g 0  . nystatin cream (MYCOSTATIN) APPLY TOPICALLY TO THE AFFECTED AREA(S) TWICE DAILY 60 g 0  . pantoprazole (PROTONIX) 40 MG tablet Take 1 tablet (40 mg total) by mouth daily. 30 tablet 5  . solifenacin (VESICARE) 5 MG tablet Take 1 tablet (5 mg total) by mouth every morning. 30 tablet 6  . triamcinolone cream (KENALOG) 0.1 % APPLY TOPICALLY TO THE rash on your Back WICE DAILY 80 g 1  . [DISCONTINUED] diphenhydrAMINE (SOMINEX) 25 MG tablet Take 25 mg by mouth at bedtime as needed.     No current facility-administered medications on file prior to visit.     Allergies  Allergen Reactions  . Ibuprofen Hives, Shortness Of Breath, Palpitations and Rash    Has tolerated toradol 12/2013 inpatient  . Penicillins Anaphylaxis, Hives and Rash    Has patient had a PCN reaction causing immediate rash, facial/tongue/throat swelling, SOB or lightheadedness with hypotension: Yes Has patient had a PCN reaction causing severe rash involving mucus membranes or skin necrosis: Yes Has patient had a PCN reaction that required hospitalization Unknown Has patient had a PCN reaction occurring within the last 10 years: Unknown If all of the above answers are "NO", then may proceed with Cephalosporin use.   . Sulfa Antibiotics Hives, Shortness Of Breath, Rash and Cough  . Adhesive [Tape] Rash  . Doxycycline Hives, Swelling and Rash  . Sulfamethoxazole-Trimethoprim Rash  . Zithromax [Azithromycin] Hives, Swelling and Rash    Social History   Socioeconomic History  . Marital status: Single    Spouse name: Not  on file  . Number of children: Not on file  . Years of education: Not on file  . Highest education level: Not on file  Occupational History  . Not on file  Social Needs  . Financial resource strain: Not on file  . Food insecurity    Worry: Not on file    Inability: Not on file  . Transportation needs    Medical: Not on file    Non-medical: Not on file  Tobacco Use  . Smoking status: Former Smoker    Types: Cigarettes    Quit date: 03/25/2003    Years since quitting: 15.8  . Smokeless tobacco: Former Systems developer    Quit date: 05/27/1978  Substance and Sexual Activity  . Alcohol use: Yes    Alcohol/week: 0.0 standard drinks  Comment: years ago  no longer beer only  early 20's  . Drug use: Yes    Types: "Crack" cocaine, Other-see comments    Comment: years  ago  . Sexual activity: Yes    Birth control/protection: Other-see comments, None, Abstinence  Lifestyle  . Physical activity    Days per week: Not on file    Minutes per session: Not on file  . Stress: Not on file  Relationships  . Social Herbalist on phone: Not on file    Gets together: Not on file    Attends religious service: Not on file    Active member of club or organization: Not on file    Attends meetings of clubs or organizations: Not on file    Relationship status: Not on file  . Intimate partner violence    Fear of current or ex partner: Not on file    Emotionally abused: Not on file    Physically abused: Not on file    Forced sexual activity: Not on file  Other Topics Concern  . Not on file  Social History Narrative   ** Merged History Encounter **       Patient has exhausted [facility] financial resources. She does have access to [employer] and [employer]. She should be redirected to purse disability with her attorney and reapply of Medicaid    Family History  Problem Relation Age of Onset  . Asthma Mother   . Diabetes Father   . Stroke Father   . Heart disease Father   . Arthritis Sister    . Asthma Sister   . Asthma Daughter   . Asthma Son   . Arthritis Sister   . Asthma Sister     Past Surgical History:  Procedure Laterality Date  . BACK SURGERY    . CESAREAN SECTION    . CESAREAN SECTION    . FRACTURE SURGERY    . TUBAL LIGATION      ROS: Review of Systems Negative except as stated above  PHYSICAL EXAM: BP 138/65   Pulse 61   Temp 98.4 F (36.9 C) (Oral)   Resp 16   LMP 03/24/2009   SpO2 92%    Physical Exam  General appearance - alert, morbidly obese middle-age Caucasian female in NAD.  Patient sitting in her mobility chair.   Mental status - normal mood, behavior, speech, dress, motor activity, and thought processes Neck - supple, no significant adenopathy Chest - clear to auscultation, no wheezes, rales or rhonchi, symmetric air entry Heart - normal rate, regular rhythm, normal S1, S2, no murmurs, rubs, clicks or gallops Musculoskeletal -knee joints: Large body habitus.  No point tenderness.  Moderate discomfort with attempted passive range of motion Extremities -no pitting edema of the lower legs.   CMP Latest Ref Rng & Units 01/24/2019 05/21/2018 04/06/2018  Glucose 65 - 99 mg/dL 95 85 106(H)  BUN 6 - 24 mg/dL 23 19 35(H)  Creatinine 0.57 - 1.00 mg/dL 1.61(H) 1.39(H) 2.08(H)  Sodium 134 - 144 mmol/L 144 144 143  Potassium 3.5 - 5.2 mmol/L 5.3(H) 3.9 5.2(H)  Chloride 96 - 106 mmol/L 102 100 106  CO2 20 - 29 mmol/L 30(H) 26 27  Calcium 8.7 - 10.2 mg/dL 8.6(L) 9.1 8.4(L)  Total Protein 6.0 - 8.5 g/dL 6.6 - -  Total Bilirubin 0.0 - 1.2 mg/dL 0.2 - -  Alkaline Phos 39 - 117 IU/L 73 - -  AST 0 - 40 IU/L  10 - -  ALT 0 - 32 IU/L 8 - -   Lipid Panel     Component Value Date/Time   CHOL 190 12/01/2017 1541   TRIG 261 (H) 12/01/2017 1541   HDL 37 (L) 12/01/2017 1541   CHOLHDL 5.1 (H) 12/01/2017 1541   CHOLHDL 4.9 12/14/2015 0303   VLDL 26 12/14/2015 0303   LDLCALC 101 (H) 12/01/2017 1541    CBC    Component Value Date/Time   WBC 7.3  01/24/2019 1646   WBC 9.0 04/06/2018 1653   RBC 3.75 (L) 01/24/2019 1646   RBC 3.70 (L) 04/06/2018 1653   HGB 11.2 01/24/2019 1646   HCT 35.2 01/24/2019 1646   PLT 226 01/24/2019 1646   MCV 94 01/24/2019 1646   MCH 29.9 01/24/2019 1646   MCH 29.7 04/06/2018 1653   MCHC 31.8 01/24/2019 1646   MCHC 28.7 (L) 04/06/2018 1653   RDW 12.8 01/24/2019 1646   LYMPHSABS 2.1 10/15/2017 2320   MONOABS 0.4 10/15/2017 2320   EOSABS 0.3 10/15/2017 2320   BASOSABS 0.0 10/15/2017 2320    ASSESSMENT AND PLAN: 1. Chronic diastolic heart failure (HCC) Unable to get patient on the table to check for JVD but in listening to her lungs which sound clear and no pitting edema in the lower extremities, she does not seem to be fluid overloaded.  I will check a BNP.  Continue current dose of furosemide.  Continue current blood pressure medications - Comprehensive metabolic panel - Brain natriuretic peptide  2. Essential hypertension See #1 above.  Encouraged to limit salt in the foods. - CBC - Comprehensive metabolic panel  3. Primary osteoarthritis of both knees Advised patient that due to her kidney function we cannot put her on Indocin or any NSAIDs as they will make kidney function worse.  I have refilled the Tylenol with codeine.  She is agreeable to trying Cymbalta.  I will have her discontinue Zoloft - acetaminophen-codeine (TYLENOL #3) 300-30 MG tablet; Take 1 tablet by mouth every 8 (eight) hours as needed for moderate pain (fill on or after 12/24/2018). Each prescription to last one month  Dispense: 90 tablet; Refill: 0 - DULoxetine (CYMBALTA) 20 MG capsule; Take 1 capsule (20 mg total) by mouth daily.  Dispense: 60 capsule; Refill: 3  4. Morbid obesity ([place]) Strongly encourage weight loss through better eating habits.  She is limited in her mobility due to arthritis in her knees.  5. Stage 3a chronic kidney disease Recheck kidney function today. Advised patient that use of Indocin is not an  option  6. OSA (obstructive sleep apnea) We will resubmit referral for sleep study again    Patient was given the opportunity to ask questions.  Patient verbalized understanding of the plan and was able to repeat key elements of the plan.   Orders Placed This Encounter  Procedures  . CBC  . Comprehensive metabolic panel  . Brain natriuretic peptide     Requested Prescriptions   Signed Prescriptions Disp Refills  . acetaminophen-codeine (TYLENOL #3) 300-30 MG tablet 90 tablet 0    Sig: Take 1 tablet by mouth every 8 (eight) hours as needed for moderate pain (fill on or after 12/24/2018). Each prescription to last one month  . nystatin (MYCOSTATIN/NYSTOP) powder 60 g 5    Sig: Apply topically 4 (four) times daily. Apply beneath breast and below abdominal fold  . DULoxetine (CYMBALTA) 20 MG capsule 60 capsule 3    Sig: Take 1 capsule (20 mg total)  by mouth daily.    Return in about 3 months (around 04/26/2019).  Karle Plumber, MD, FACP

## 2019-01-25 ENCOUNTER — Telehealth: Payer: Self-pay | Admitting: Internal Medicine

## 2019-01-25 DIAGNOSIS — N1832 Chronic kidney disease, stage 3b: Secondary | ICD-10-CM

## 2019-01-25 DIAGNOSIS — E875 Hyperkalemia: Secondary | ICD-10-CM

## 2019-01-25 DIAGNOSIS — G4733 Obstructive sleep apnea (adult) (pediatric): Secondary | ICD-10-CM

## 2019-01-25 LAB — COMPREHENSIVE METABOLIC PANEL
ALT: 8 IU/L (ref 0–32)
AST: 10 IU/L (ref 0–40)
Albumin/Globulin Ratio: 1.9 (ref 1.2–2.2)
Albumin: 4.3 g/dL (ref 3.8–4.9)
Alkaline Phosphatase: 73 IU/L (ref 39–117)
BUN/Creatinine Ratio: 14 (ref 9–23)
BUN: 23 mg/dL (ref 6–24)
Bilirubin Total: 0.2 mg/dL (ref 0.0–1.2)
CO2: 30 mmol/L — ABNORMAL HIGH (ref 20–29)
Calcium: 8.6 mg/dL — ABNORMAL LOW (ref 8.7–10.2)
Chloride: 102 mmol/L (ref 96–106)
Creatinine, Ser: 1.61 mg/dL — ABNORMAL HIGH (ref 0.57–1.00)
GFR calc Af Amer: 41 mL/min/{1.73_m2} — ABNORMAL LOW (ref >59)
GFR calc non Af Amer: 36 mL/min/{1.73_m2} — ABNORMAL LOW (ref >59)
Globulin, Total: 2.3 g/dL (ref 1.5–4.5)
Glucose: 95 mg/dL (ref 65–99)
Potassium: 5.3 mmol/L — ABNORMAL HIGH (ref 3.5–5.2)
Sodium: 144 mmol/L (ref 134–144)
Total Protein: 6.6 g/dL (ref 6.0–8.5)

## 2019-01-25 LAB — CBC
Hematocrit: 35.2 % (ref 34.0–46.6)
Hemoglobin: 11.2 g/dL (ref 11.1–15.9)
MCH: 29.9 pg (ref 26.6–33.0)
MCHC: 31.8 g/dL (ref 31.5–35.7)
MCV: 94 fL (ref 79–97)
Platelets: 226 10*3/uL (ref 150–450)
RBC: 3.75 x10E6/uL — ABNORMAL LOW (ref 3.77–5.28)
RDW: 12.8 % (ref 11.7–15.4)
WBC: 7.3 10*3/uL (ref 3.4–10.8)

## 2019-01-25 LAB — BRAIN NATRIURETIC PEPTIDE: BNP: 35.7 pg/mL (ref 0.0–100.0)

## 2019-01-25 NOTE — Telephone Encounter (Signed)
Phone call placed to patient this morning to discuss lab results.  I left a message on her voicemail informing her that her kidney function is still not 100%.  I would like to refer her to a kidney specialist and I will submit that referral.  I also informed her that her potassium level is slightly elevated.  I recommend cutting back on potassium rich foods like bananas and orange juice.  Please return to the lab in 1 week to have potassium level rechecked.  If potassium level is still elevated we will have to cut the dose of the lisinopril. I have also resubmitted referral for sleep study.  Results for orders placed or performed in visit on 01/24/19  CBC  Result Value Ref Range   WBC 7.3 3.4 - 10.8 x10E3/uL   RBC 3.75 (L) 3.77 - 5.28 x10E6/uL   Hemoglobin 11.2 11.1 - 15.9 g/dL   Hematocrit 35.2 34.0 - 46.6 %   MCV 94 79 - 97 fL   MCH 29.9 26.6 - 33.0 pg   MCHC 31.8 31.5 - 35.7 g/dL   RDW 12.8 11.7 - 15.4 %   Platelets 226 150 - 450 x10E3/uL  Comprehensive metabolic panel  Result Value Ref Range   Glucose 95 65 - 99 mg/dL   BUN 23 6 - 24 mg/dL   Creatinine, Ser 1.61 (H) 0.57 - 1.00 mg/dL   GFR calc non Af Amer 36 (L) >59 mL/min/1.73   GFR calc Af Amer 41 (L) >59 mL/min/1.73   BUN/Creatinine Ratio 14 9 - 23   Sodium 144 134 - 144 mmol/L   Potassium 5.3 (H) 3.5 - 5.2 mmol/L   Chloride 102 96 - 106 mmol/L   CO2 30 (H) 20 - 29 mmol/L   Calcium 8.6 (L) 8.7 - 10.2 mg/dL   Total Protein 6.6 6.0 - 8.5 g/dL   Albumin 4.3 3.8 - 4.9 g/dL   Globulin, Total 2.3 1.5 - 4.5 g/dL   Albumin/Globulin Ratio 1.9 1.2 - 2.2   Bilirubin Total 0.2 0.0 - 1.2 mg/dL   Alkaline Phosphatase 73 39 - 117 IU/L   AST 10 0 - 40 IU/L   ALT 8 0 - 32 IU/L  Brain natriuretic peptide  Result Value Ref Range   BNP WILL FOLLOW

## 2019-01-25 NOTE — Telephone Encounter (Signed)
Lvm asking pt to give me a call at her earliest convenience to go over lab results

## 2019-02-04 IMAGING — DX DG CHEST 2V
3 series · 3 of 3 positions shown · non-contrast
Comparison: September 28, 2015

CLINICAL DATA: Shortness of breath. Chest pain. Low back and right
arm pain after trauma.

EXAM:
CHEST  2 VIEW

[chest lat (1 of 2)]
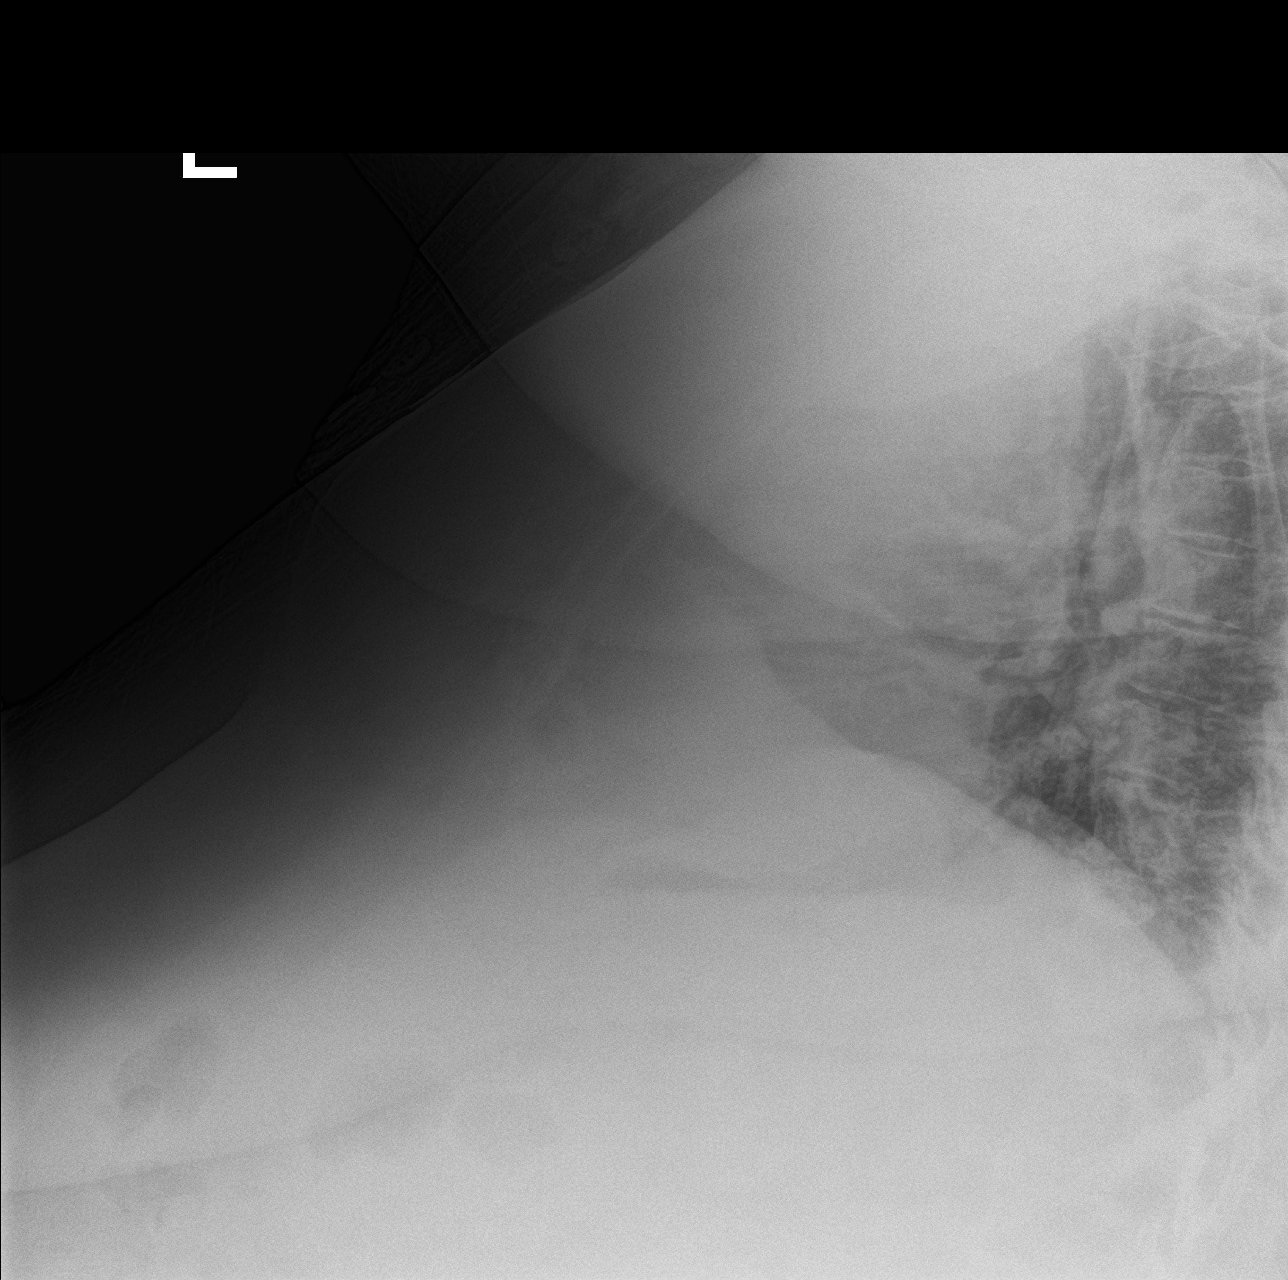

[chest ap]
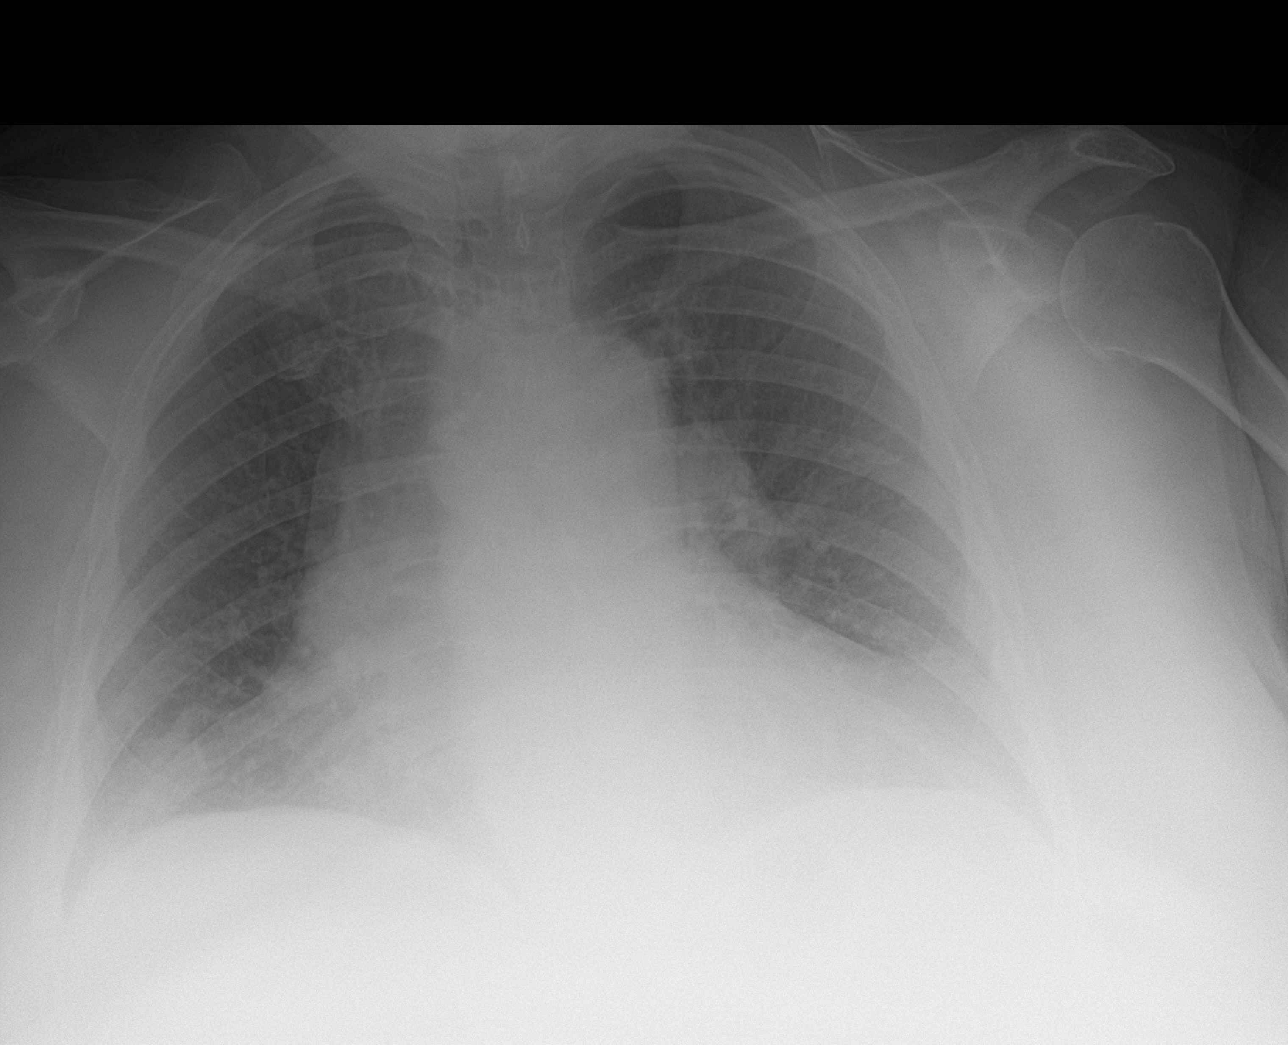

[chest lat (2 of 2)]
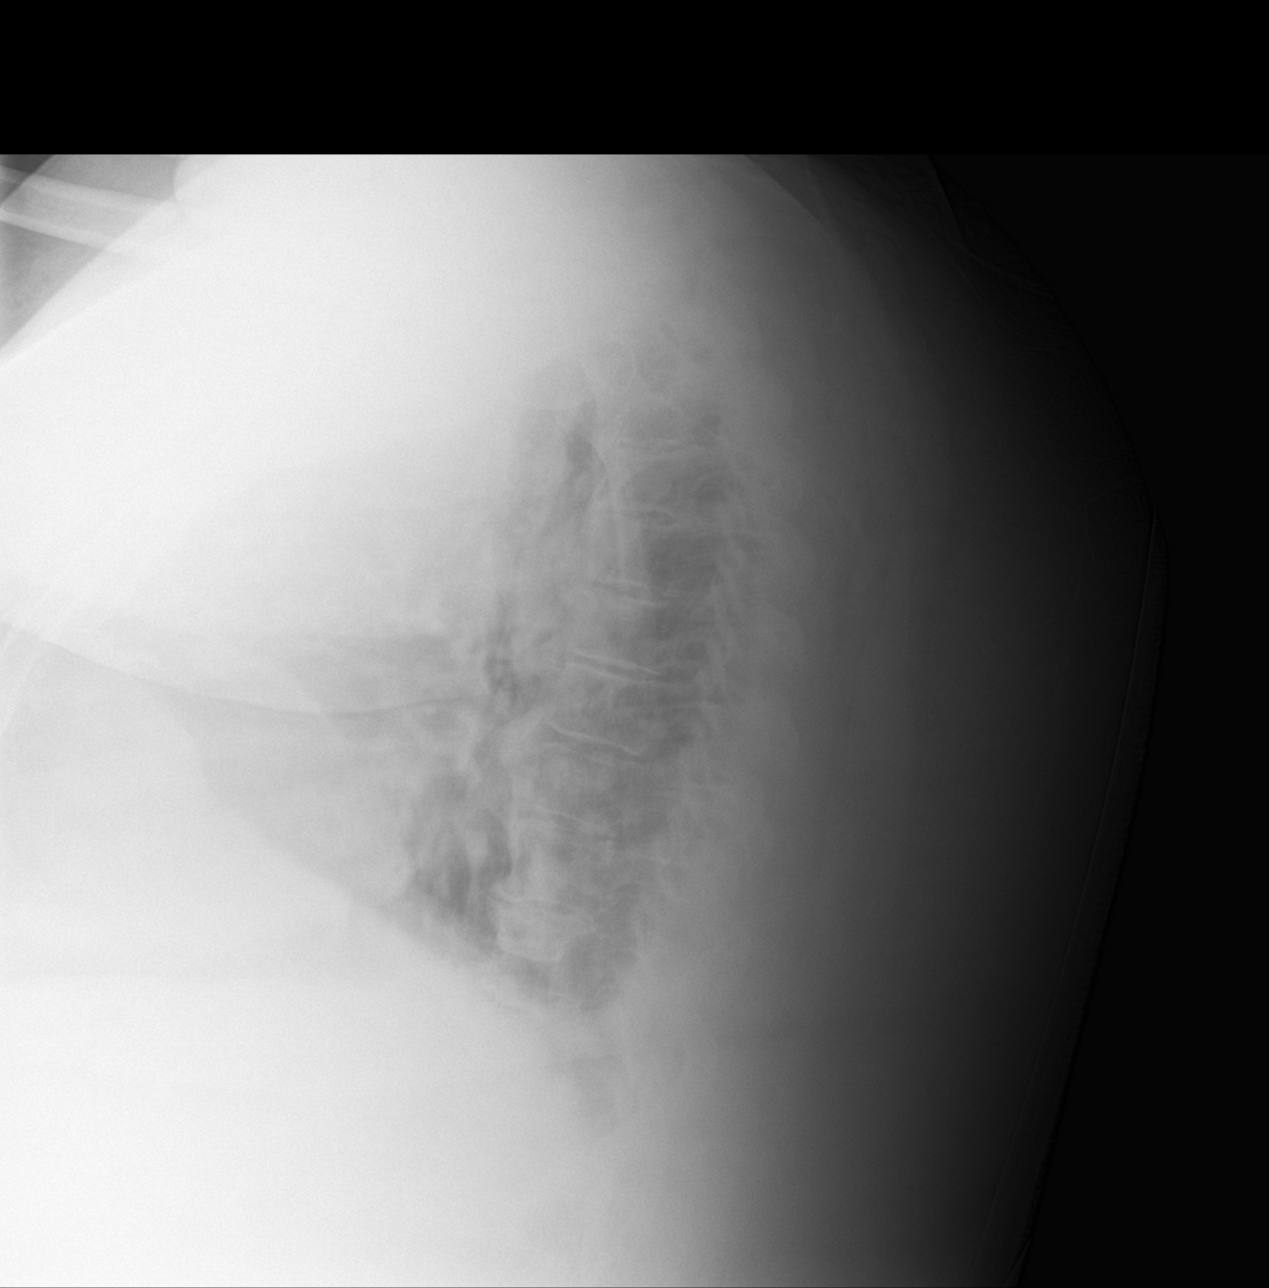

[3 of 3 positions shown; findings below may reference images not displayed]

FINDINGS: No pneumothorax. Stable cardiomegaly. The hila and mediastinum are
unchanged. Mild opacity in the lateral right lung base. The lungs
are otherwise clear. No overt edema. Limited views of the thoracic
spine are unremarkable.
IMPRESSION: Mild opacity in the lateral right lung base could represent
atelectasis or early infiltrate. Recommend clinical correlation and
follow-up to resolution.

## 2019-02-15 ENCOUNTER — Other Ambulatory Visit: Payer: Self-pay | Admitting: Internal Medicine

## 2019-03-11 ENCOUNTER — Other Ambulatory Visit: Payer: Self-pay | Admitting: Internal Medicine

## 2019-03-11 ENCOUNTER — Emergency Department (HOSPITAL_COMMUNITY)
Admission: EM | Admit: 2019-03-11 | Discharge: 2019-03-12 | Disposition: A | Discharge status: Home / Self Care | Payer: Medicaid Other | Attending: Emergency Medicine | Admitting: Emergency Medicine

## 2019-03-11 ENCOUNTER — Ambulatory Visit: Payer: Medicaid Other | Admitting: Internal Medicine

## 2019-03-11 DIAGNOSIS — I509 Heart failure, unspecified: Secondary | ICD-10-CM | POA: Insufficient documentation

## 2019-03-11 DIAGNOSIS — I130 Hypertensive heart and chronic kidney disease with heart failure and stage 1 through stage 4 chronic kidney disease, or unspecified chronic kidney disease: Secondary | ICD-10-CM | POA: Insufficient documentation

## 2019-03-11 DIAGNOSIS — R21 Rash and other nonspecific skin eruption: Secondary | ICD-10-CM | POA: Diagnosis not present

## 2019-03-11 DIAGNOSIS — R2243 Localized swelling, mass and lump, lower limb, bilateral: Secondary | ICD-10-CM | POA: Insufficient documentation

## 2019-03-11 DIAGNOSIS — Z20828 Contact with and (suspected) exposure to other viral communicable diseases: Secondary | ICD-10-CM | POA: Insufficient documentation

## 2019-03-11 DIAGNOSIS — N183 Chronic kidney disease, stage 3 unspecified: Secondary | ICD-10-CM | POA: Insufficient documentation

## 2019-03-11 DIAGNOSIS — F1410 Cocaine abuse, uncomplicated: Secondary | ICD-10-CM | POA: Insufficient documentation

## 2019-03-11 DIAGNOSIS — J45909 Unspecified asthma, uncomplicated: Secondary | ICD-10-CM | POA: Insufficient documentation

## 2019-03-11 DIAGNOSIS — Z87891 Personal history of nicotine dependence: Secondary | ICD-10-CM | POA: Insufficient documentation

## 2019-03-11 DIAGNOSIS — Z79899 Other long term (current) drug therapy: Secondary | ICD-10-CM | POA: Diagnosis not present

## 2019-03-11 DIAGNOSIS — Z9981 Dependence on supplemental oxygen: Secondary | ICD-10-CM | POA: Diagnosis not present

## 2019-03-11 DIAGNOSIS — R0602 Shortness of breath: Secondary | ICD-10-CM

## 2019-03-11 DIAGNOSIS — M170 Bilateral primary osteoarthritis of knee: Secondary | ICD-10-CM

## 2019-03-11 LAB — BASIC METABOLIC PANEL
Anion gap: 9 (ref 5–15)
BUN: 27 mg/dL — ABNORMAL HIGH (ref 6–20)
CO2: 30 mmol/L (ref 22–32)
Calcium: 8.8 mg/dL — ABNORMAL LOW (ref 8.9–10.3)
Chloride: 103 mmol/L (ref 98–111)
Creatinine, Ser: 1.54 mg/dL — ABNORMAL HIGH (ref 0.44–1.00)
GFR calc Af Amer: 44 mL/min — ABNORMAL LOW (ref >60)
GFR calc non Af Amer: 38 mL/min — ABNORMAL LOW (ref >60)
Glucose, Bld: 130 mg/dL — ABNORMAL HIGH (ref 70–99)
Potassium: 4.4 mmol/L (ref 3.5–5.1)
Sodium: 142 mmol/L (ref 135–145)

## 2019-03-11 LAB — CBC
HCT: 38.9 % (ref 36.0–46.0)
Hemoglobin: 11.5 g/dL — ABNORMAL LOW (ref 12.0–15.0)
MCH: 30.2 pg (ref 26.0–34.0)
MCHC: 29.6 g/dL — ABNORMAL LOW (ref 30.0–36.0)
MCV: 102.1 fL — ABNORMAL HIGH (ref 80.0–100.0)
Platelets: 244 10*3/uL (ref 150–400)
RBC: 3.81 MIL/uL — ABNORMAL LOW (ref 3.87–5.11)
RDW: 13.6 % (ref 11.5–15.5)
WBC: 9.9 10*3/uL (ref 4.0–10.5)
nRBC: 0 % (ref 0.0–0.2)

## 2019-03-11 MED ORDER — SODIUM CHLORIDE 0.9% FLUSH
3.0000 mL | Freq: Once | INTRAVENOUS | Status: AC
  Administered 2019-03-12: 02:00:00 3 mL via INTRAVENOUS

## 2019-03-11 MED ORDER — ACETAMINOPHEN-CODEINE #3 300-30 MG PO TABS: 1.0000 | ORAL_TABLET | Freq: Three times a day (TID) | ORAL | 0 refills | Status: DC | PRN

## 2019-03-11 MED FILL — NYSTATIN 100000 UNIT/GM POW: 100000 | 30 days supply | Qty: 60 | Fill #0

## 2019-03-11 MED FILL — ACETAMINOPHEN/COD #3 TABLET: 300-30 | 7 days supply | Qty: 21 | Fill #0 | Status: TO

## 2019-03-11 NOTE — ED Triage Notes (Signed)
Pt reports SOB x 1 weeks and bed bug bites all over, states she has been trying to treat her house for them. Denies fevers or sick contacts.

## 2019-03-12 ENCOUNTER — Emergency Department (HOSPITAL_COMMUNITY): Payer: Medicaid Other

## 2019-03-12 ENCOUNTER — Ambulatory Visit (HOSPITAL_COMMUNITY): Admit: 2019-03-12 | Payer: Medicaid Other

## 2019-03-12 LAB — POC SARS CORONAVIRUS 2 AG -  ED: SARS Coronavirus 2 Ag: NEGATIVE

## 2019-03-12 LAB — TROPONIN I (HIGH SENSITIVITY)
Troponin I (High Sensitivity): 5 ng/L (ref <18)
Troponin I (High Sensitivity): 5 ng/L (ref <18)

## 2019-03-12 LAB — D-DIMER, QUANTITATIVE: D-Dimer, Quant: 1.45 ug/mL-FEU — ABNORMAL HIGH (ref 0.00–0.50)

## 2019-03-12 LAB — BRAIN NATRIURETIC PEPTIDE: B Natriuretic Peptide: 51.2 pg/mL (ref 0.0–100.0)

## 2019-03-12 MED ORDER — OXYCODONE-ACETAMINOPHEN 5-325 MG PO TABS
1.0000 | ORAL_TABLET | Freq: Once | ORAL | Status: AC
  Administered 2019-03-12: 1 via ORAL
  Filled 2019-03-12: qty 1

## 2019-03-12 MED ORDER — IOHEXOL 350 MG/ML SOLN
100.0000 mL | Freq: Once | INTRAVENOUS | Status: AC | PRN
  Administered 2019-03-12: 100 mL via INTRAVENOUS

## 2019-03-12 MED ORDER — FUROSEMIDE 10 MG/ML IJ SOLN
40.0000 mg | Freq: Once | INTRAMUSCULAR | Status: DC
  Filled 2019-03-12: qty 4

## 2019-03-12 MED ORDER — CEPHALEXIN 500 MG PO CAPS: 500.0000 mg | ORAL_CAPSULE | Freq: Four times a day (QID) | ORAL | 0 refills | Status: DC

## 2019-03-12 MED ORDER — OXYCODONE-ACETAMINOPHEN 5-325 MG PO TABS
1.0000 | ORAL_TABLET | Freq: Once | ORAL | Status: AC
  Administered 2019-03-12: 02:00:00 1 via ORAL
  Filled 2019-03-12: qty 1

## 2019-03-12 MED ORDER — BACITRACIN ZINC 500 UNIT/GM EX OINT: 1.0000 "application " | TOPICAL_OINTMENT | Freq: Two times a day (BID) | CUTANEOUS | 0 refills | Status: DC

## 2019-03-12 NOTE — ED Notes (Signed)
Placed warm packs on pts leg to ease pain while PO medication takes effect.

## 2019-03-12 NOTE — Discharge Instructions (Addendum)
You were seen today for shortness of breath. Your work-up is largely reassuring. Repeat Covid testing is pending. You should isolate until its results. An ultrasound was ordered of your lower extremity. Return later today as instructed. Additionally, you need to try to stop picking at the rash on your body. This puts you at risk for infection. You will be given an antibiotic. Apply bacitracin as needed.

## 2019-03-12 NOTE — ED Notes (Signed)
Pt dc'd home w/home w/all belongings, a/o x4, used own motorized wheelchair for exit to bus stop

## 2019-03-12 NOTE — ED Provider Notes (Addendum)
[facility] EMERGENCY DEPARTMENT Provider Note   CSN: [id number] Arrival date & time: 03/11/19  1808     History No chief complaint on file.   Kara Vincent is a 53 y.o. female.  HPI     This a 53 year old female with a history of morbid obesity, heart failure, chronic kidney disease, hypertension, hyperlipidemia who presents with shortness of breath and bug bites.  Patient reports 1 week history of worsening shortness of breath.  She reports lower extremity edema left greater than right and some left leg pain.  No history of blood clots.  She at baseline is on 3 L of home O2.  Has not had to escalate this.  Denies any fevers or cough.  No known Covid exposures.  She denies chest pain.  She also reports that she has bedbugs at home and has multiple bites over her face, abdomen, lower extremities.  She denies any abdominal pain, nausea, vomiting.  Past Medical History:  Diagnosis Date  . Allergy   . Anxiety   . Arthritis   . Asthma   . Asthma   . CHF (congestive heart failure) ([place])   . CHF (congestive heart failure) ([place])   . Chronic abdominal pain   . Chronic kidney disease   . Chronic pain    "all over"  . 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 GERD (gastroesophageal reflux disease)   . Gout   . Hidradenitis   . HLD (hyperlipidemia) 12/14/2015  . Homelessness   . Hyperlipemia   . Hypertension   . Low back pain   . Morbid obesity ([place])    uses motor wheel chair  . MRSA (methicillin resistant Staphylococcus aureus)    states about a year ago  . On home O2    3L N/C O2 continuously  . OSA (obstructive sleep apnea)    non-compliant with CPAP  . Oxygen deficiency   . Renal insufficiency   . Sleep apnea     Patient Active Problem List   Diagnosis Date Noted  . Morbid obesity ([place]) 12/01/2017  . Primary osteoarthritis of both knees 12/01/2017  . Anxiety 12/01/2017  . Impaired mobility 12/01/2017  . Essential hypertension  12/14/2015  . HLD (hyperlipidemia) 12/14/2015  . GERD (gastroesophageal reflux disease) 12/14/2015  . Gout 12/14/2015  . Darier's disease   . Leukocytosis   . 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 Right-sided heart failure ([place]) 11/20/2014  . OSA (obstructive sleep apnea)   . CKD (chronic kidney disease) stage 3, GFR 30-59 ml/min (HCC) 11/17/2014  . Asthma exacerbation 11/16/2014  . Venous stasis 11/16/2014  . Acute on chronic respiratory failure with hypoxia and hypercapnia ([place]) 11/16/2014  . Chronic pain 08/24/2011  . Obesity hypoventilation syndrome ([place]) 08/24/2011    Past Surgical History:  Procedure Laterality Date  . BACK SURGERY    . CESAREAN SECTION    . CESAREAN SECTION    . FRACTURE SURGERY    . TUBAL LIGATION       OB History    Gravida  0   Para  0   Term  0   Preterm  0   AB  0   Living        SAB  0   TAB  0   Ectopic  0   Multiple      Live Births              Family History  Problem  Relation Age of Onset  . Asthma Mother   . Diabetes Father   . Stroke Father   . Heart disease Father   . Arthritis Sister   . Asthma Sister   . Asthma Daughter   . Asthma Son   . Arthritis Sister   . Asthma Sister     Social History   Tobacco Use  . Smoking status: Former Smoker    Types: Cigarettes    Quit date: 03/25/2003    Years since quitting: 15.9  . Smokeless tobacco: Former Systems developer    Quit date: 05/27/1978  Substance Use Topics  . Alcohol use: Yes    Alcohol/week: 0.0 standard drinks    Comment: years ago  no longer beer only  early 20's  . Drug use: Yes    Types: "Crack" cocaine, Other-see comments    Comment: years  ago    Home Medications Prior to Admission medications   Medication Sig Start Date End Date Taking? Authorizing Provider  acetaminophen-codeine (TYLENOL #3) 300-30 MG tablet Take 1 tablet by mouth every 8 (eight) hours as needed for moderate pain. Each prescription to last one month 03/11/19   Ladell Pier, MD    albuterol (VENTOLIN HFA) 108 (90 Base) MCG/ACT inhaler Inhale 1-2 puffs into the lungs every 6 (six) hours as needed for wheezing or shortness of breath. 11/22/18 03/12/19  Ladell Pier, MD  allopurinol (ZYLOPRIM) 100 MG tablet Take 1 tablet (100 mg total) by mouth every morning. 11/22/18   Ladell Pier, MD  atorvastatin (LIPITOR) 20 MG tablet Take 1 tablet (20 mg total) by mouth every morning. 11/22/18   Ladell Pier, MD  bacitracin ointment Apply 1 application topically 2 (two) times daily. 03/12/19   Ayslin Kundert, Barbette Hair, MD  cephALEXin (KEFLEX) 500 MG capsule Take 1 capsule (500 mg total) by mouth 4 (four) times daily. 03/12/19   Axel Frisk, Barbette Hair, MD  cloNIDine (CATAPRES) 0.2 MG tablet Take 1 tablet (0.2 mg total) by mouth 2 (two) times daily. 11/22/18   Ladell Pier, MD  diclofenac sodium (VOLTAREN) 1 % GEL Apply 4 g topically 4 (four) times daily. 11/22/18   Ladell Pier, MD  DULoxetine (CYMBALTA) 20 MG capsule Take 1 capsule (20 mg total) by mouth daily. 01/24/19   Ladell Pier, MD  furosemide (LASIX) 40 MG tablet Take 1 tablet (40 mg total) by mouth daily. 1 tab po daily 11/22/18   Ladell Pier, MD  gabapentin (NEURONTIN) 100 MG capsule Take 100 mg by mouth 3 (three) times daily.    [provider]  hydrOXYzine (ATARAX/VISTARIL) 50 MG tablet TAKE ONE TABLET BY MOUTH THREE TIMES DAILY AS NEEDED FOR ANXIETY OR FOR ITCHING 02/16/19   Ladell Pier, MD  ipratropium-albuterol (DUONEB) 0.5-2.5 (3) MG/3ML SOLN Take 3 mLs by nebulization every 6 (six) hours as needed (SOB). 07/16/17 03/12/19  Arrien, Jimmy Picket, MD  isosorbide mononitrate (IMDUR) 30 MG 24 hr tablet Take 1 tablet (30 mg total) by mouth daily. 11/22/18   Ladell Pier, MD  lisinopril (ZESTRIL) 40 MG tablet TAKE 1 TABLET(40 MG) BY MOUTH DAILY 01/18/19   Ladell Pier, MD  Misc. Devices MISC Please provide patient with a raised toilet seat that is covered under her current  insurance plan. 03/29/18   Gildardo Pounds, NP  Misc. Devices MISC Heavy duty shower chair; Heavy duty potty chair. Dx: Osteoarthritis 10/13/18   Charlott Rakes, MD  mupirocin cream (BACTROBAN) 2 % Apply to  affected area twice a day 11/22/18   Ladell Pier, MD  nystatin (MYCOSTATIN/NYSTOP) powder Apply topically 4 (four) times daily. Apply beneath breast and below abdominal fold 01/24/19   Ladell Pier, MD  nystatin cream (MYCOSTATIN) APPLY TOPICALLY TO THE AFFECTED AREA(S) TWICE DAILY 12/28/17   Ladell Pier, MD  pantoprazole (PROTONIX) 40 MG tablet Take 1 tablet (40 mg total) by mouth daily. 11/22/18   Ladell Pier, MD  solifenacin (VESICARE) 5 MG tablet Take 1 tablet (5 mg total) by mouth every morning. 11/22/18   Ladell Pier, MD  triamcinolone cream (KENALOG) 0.1 % APPLY TOPICALLY TO THE rash on your Back [facility] DAILY 11/22/18   Ladell Pier, MD  diphenhydrAMINE (SOMINEX) 25 MG tablet Take 25 mg by mouth at bedtime as needed.  06/14/11  [provider]    Allergies    Ibuprofen, Penicillins, Sulfa antibiotics, Adhesive [tape], Doxycycline, Sulfamethoxazole-trimethoprim, and Zithromax [azithromycin]  Review of Systems   Review of Systems  Constitutional: Negative for fever.  Respiratory: Positive for shortness of breath. Negative for cough.   Cardiovascular: Positive for leg swelling. Negative for chest pain.  Gastrointestinal: Negative for abdominal pain, diarrhea, nausea and vomiting.  Genitourinary: Negative for dysuria.  Skin: Positive for rash. Negative for color change.  All other systems reviewed and are negative.   Physical Exam Updated Vital Signs BP (!) 180/90   Pulse 83   Temp 98.4 F (36.9 C) (Oral)   Resp 16   LMP 03/24/2009   SpO2 100%   Physical Exam Vitals and nursing note reviewed.  Constitutional:      Appearance: She is well-developed.     Comments: Morbidly obese, chronically ill-appearing  HENT:     Head:  Normocephalic and atraumatic.     Mouth/Throat:     Mouth: Mucous membranes are dry.  Eyes:     Pupils: Pupils are equal, round, and reactive to light.  Cardiovascular:     Rate and Rhythm: Normal rate and regular rhythm.     Heart sounds: Normal heart sounds.  Pulmonary:     Effort: Pulmonary effort is normal. No respiratory distress.     Breath sounds: No wheezing.     Comments: Difficult exam secondary to body habitus Abdominal:     General: Bowel sounds are normal.     Palpations: Abdomen is soft.     Tenderness: There is no abdominal tenderness.     Comments: Anasarca noted of the lower abdomen  Musculoskeletal:     Cervical back: Neck supple.     Right lower leg: Edema present.     Left lower leg: Edema present.     Comments: No asymmetric swelling noted  Skin:    General: Skin is warm and dry.     Comments: Large scabbed areas noted over the face, scalp, abdomen, and lower extremities, no active oozing, no significant adjacent erythema  Neurological:     Mental Status: She is alert and oriented to person, place, and time.  Psychiatric:        Mood and Affect: Mood normal.     ED Results / Procedures / Treatments   Labs (all labs ordered are listed, but only abnormal results are displayed) Labs Reviewed  BASIC METABOLIC PANEL - Abnormal; Notable for the following components:      Result Value   Glucose, Bld 130 (*)    BUN 27 (*)    Creatinine, Ser 1.54 (*)    Calcium 8.8 (*)  GFR calc non Af Amer 38 (*)    GFR calc Af Amer 44 (*)    All other components within normal limits  CBC - Abnormal; Notable for the following components:   RBC 3.81 (*)    Hemoglobin 11.5 (*)    MCV 102.1 (*)    MCHC 29.6 (*)    All other components within normal limits  D-DIMER, QUANTITATIVE (NOT AT [facility]) - Abnormal; Notable for the following components:   D-Dimer, Quant 1.45 (*)    All other components within normal limits  NOVEL CORONAVIRUS, NAA (HOSP ORDER, SEND-OUT TO REF LAB;  TAT 18-24 HRS)  BRAIN NATRIURETIC PEPTIDE  POC SARS CORONAVIRUS 2 AG -  ED  TROPONIN I (HIGH SENSITIVITY)  TROPONIN I (HIGH SENSITIVITY)    EKG EKG Interpretation  Date/Time:  Friday March 11 2019 18:27:36 EST Ventricular Rate:  83 PR Interval:  158 QRS Duration: 90 QT Interval:  364 QTC Calculation: 427 R Axis:   44 Text Interpretation: Sinus rhythm with Premature supraventricular complexes Low voltage QRS Borderline ECG Confirmed by Thayer Jew [phone]) on 03/12/2019 12:33:52 AM   Radiology CT Angio Chest PE W and/or Wo Contrast  Result Date: 03/12/2019 CLINICAL DATA:  Shortness of breath. Patient reports bed bug bites all over. EXAM: CT ANGIOGRAPHY CHEST WITH CONTRAST TECHNIQUE: Multidetector CT imaging of the chest was performed using the standard protocol during bolus administration of intravenous contrast. Multiplanar CT image reconstructions and MIPs were obtained to evaluate the vascular anatomy. CONTRAST:  51mL OMNIPAQUE IOHEXOL 350 MG/ML SOLN COMPARISON:  Radiograph earlier this day.  Chest CT 11/15/2014 FINDINGS: Cardiovascular: There are no filling defects within the pulmonary arteries to suggest pulmonary embolus. Evaluation is diagnostic to the segmental level. Cannot assess subsegmental branches given contrast bolus timing and soft tissue attenuation from habitus. Mild cardiomegaly. There are coronary artery calcifications. No aortic dissection. Conventional branching pattern from the aortic arch. Mediastinum/Nodes: No enlarged mediastinal or hilar lymph nodes. Prominent 12 mm left axillary lymph node. No definite right axillary adenopathy, evaluation slightly limited by streak artifact secondary to patient's body wall abutting the CT gantry. Decompressed esophagus. No thyroid nodule. Lungs/Pleura: Mild linear atelectasis in the lower lobes and right middle lobe. No confluent airspace disease. Mild central bronchial thickening. No pulmonary edema. No pleural fluid. Upper  Abdomen: No acute findings. Musculoskeletal: There are no acute or suspicious osseous abnormalities. There is degenerative change in the spine. Review of the MIP images confirms the above findings. IMPRESSION: 1. No pulmonary embolus. 2. Scattered linear atelectasis within both lungs. No other acute intrathoracic abnormality. 3. Prominent left axillary lymph node at 12 mm, nonspecific. Recommend up-to-date mammography. 4. Mild cardiomegaly. Electronically Signed   By: Keith Rake M.D.   On: 03/12/2019 04:18   DG Chest Port 1 View  Result Date: 03/12/2019 CLINICAL DATA:  Shortness of breath for 1 week. EXAM: PORTABLE CHEST 1 VIEW COMPARISON:  Radiograph 04/06/2018.  CT 11/15/2014 FINDINGS: Patient had difficulty tolerating the exam. Soft tissue attenuation from body habitus further limits evaluation. Enlarged cardiopericardial silhouette, likely in part related to mediastinal lipomatosis is seen on prior exam. Findings are not significantly changed. No focal airspace disease. No pneumothorax. No large pleural effusion, however right costophrenic angle is not included in the field of view. IMPRESSION: Cardiomegaly, which may be in part related to mediastinal lipomatosis as seen on prior exam. No definite acute pulmonary process, however soft tissue attenuation from habitus limits detailed assessment. Electronically Signed   By: Keith Rake  M.D.   On: 03/12/2019 01:22    Procedures Procedures (including critical care time)  Medications Ordered in ED Medications  sodium chloride flush (NS) 0.9 % injection 3 mL (3 mLs Intravenous Given 03/12/19 0212)  oxyCODONE-acetaminophen (PERCOCET/ROXICET) 5-325 MG per tablet 1 tablet (1 tablet Oral Given 03/12/19 0145)  iohexol (OMNIPAQUE) 350 MG/ML injection 100 mL (100 mLs Intravenous Contrast Given 03/12/19 0407)    ED Course  I have reviewed the triage vital signs and the nursing notes.  Pertinent labs & imaging results that were available during  my care of the patient were reviewed by me and considered in my medical decision making (see chart for details).    MDM Rules/Calculators/A&P                      Patient presents with shortness of breath and rash. She is chronically ill-appearing and morbidly obese. She is on home oxygen. She is in no respiratory distress. Her exam is severely limited secondary to body habitus. Work-up initiated. Chest x-ray without any pneumothorax or pneumonia. She is low risk for Covid and her initial Covid testing is negative. Repeat is pending. Patient will be instructed to isolate until this returns. EKG is nonischemic. Troponin x2 is negative. D-dimer was positive at 1.45. CT scan obtained and shows no evidence of blood clot. However, given her leg pain, we will have her return for DVT study.  She does appear somewhat volume overloaded.  She is requesting Lasix.  This was provided.  Only 1 dose given her renal function.  BNP is at her baseline.  Additionally, patient has excoriation and scabbing throughout her body. It appears that she is picking. I do not see any evidence of bites. She is at high risk for superimposed infection. I have encouraged her to stop picking. Apply bacitracin. She will be given a short course of Keflex for possible mild overlying infection.  After history, exam, and medical workup I feel the patient has been appropriately medically screened and is safe for discharge home. Pertinent diagnoses were discussed with the patient. Patient was given return precautions.  Kara Vincent was evaluated in Emergency Department on 03/12/2019 for the symptoms described in the history of present illness. She was evaluated in the context of the global COVID-19 pandemic, which necessitated consideration that the patient might be at risk for infection with the SARS-CoV-2 virus that causes COVID-19. Institutional protocols and algorithms that pertain to the evaluation of patients at risk for COVID-19 are  in a state of rapid change based on information released by regulatory bodies including the CDC and federal and state organizations. These policies and algorithms were followed during the patient's care in the ED.   Final Clinical Impression(s) / ED Diagnoses Final diagnoses:  SOB (shortness of breath)  Rash    Rx / DC Orders ED Discharge Orders         Ordered    cephALEXin (KEFLEX) 500 MG capsule  4 times daily     03/12/19 0505    bacitracin ointment  2 times daily     03/12/19 0505    LE VENOUS     03/12/19 0505           Iva Posten, Barbette Hair, MD 03/12/19 [id number]    Merryl Hacker, MD 03/12/19 [phone]

## 2019-04-14 ENCOUNTER — Other Ambulatory Visit: Payer: Self-pay

## 2019-04-14 ENCOUNTER — Encounter: Payer: Self-pay | Admitting: Internal Medicine

## 2019-04-14 ENCOUNTER — Ambulatory Visit: Payer: Medicaid Other | Attending: Internal Medicine | Admitting: Internal Medicine

## 2019-04-14 VITALS — BP 110/60 | HR 65 | Temp 98.9°F | Resp 16 | Wt 390.0 lb

## 2019-04-14 DIAGNOSIS — Z87891 Personal history of nicotine dependence: Secondary | ICD-10-CM | POA: Diagnosis not present

## 2019-04-14 DIAGNOSIS — Z882 Allergy status to sulfonamides status: Secondary | ICD-10-CM | POA: Diagnosis not present

## 2019-04-14 DIAGNOSIS — Z823 Family history of stroke: Secondary | ICD-10-CM | POA: Insufficient documentation

## 2019-04-14 DIAGNOSIS — I10 Essential (primary) hypertension: Secondary | ICD-10-CM | POA: Diagnosis not present

## 2019-04-14 DIAGNOSIS — Z886 Allergy status to analgesic agent status: Secondary | ICD-10-CM | POA: Diagnosis not present

## 2019-04-14 DIAGNOSIS — Z8261 Family history of arthritis: Secondary | ICD-10-CM | POA: Insufficient documentation

## 2019-04-14 DIAGNOSIS — N183 Chronic kidney disease, stage 3 unspecified: Secondary | ICD-10-CM | POA: Diagnosis not present

## 2019-04-14 DIAGNOSIS — I130 Hypertensive heart and chronic kidney disease with heart failure and stage 1 through stage 4 chronic kidney disease, or unspecified chronic kidney disease: Secondary | ICD-10-CM | POA: Insufficient documentation

## 2019-04-14 DIAGNOSIS — Z6841 Body Mass Index (BMI) 40.0 and over, adult: Secondary | ICD-10-CM | POA: Insufficient documentation

## 2019-04-14 DIAGNOSIS — Z888 Allergy status to other drugs, medicaments and biological substances status: Secondary | ICD-10-CM | POA: Diagnosis not present

## 2019-04-14 DIAGNOSIS — M170 Bilateral primary osteoarthritis of knee: Secondary | ICD-10-CM

## 2019-04-14 DIAGNOSIS — H6121 Impacted cerumen, right ear: Secondary | ICD-10-CM | POA: Diagnosis not present

## 2019-04-14 DIAGNOSIS — Z8249 Family history of ischemic heart disease and other diseases of the circulatory system: Secondary | ICD-10-CM | POA: Insufficient documentation

## 2019-04-14 DIAGNOSIS — G4733 Obstructive sleep apnea (adult) (pediatric): Secondary | ICD-10-CM | POA: Diagnosis not present

## 2019-04-14 DIAGNOSIS — Z881 Allergy status to other antibiotic agents status: Secondary | ICD-10-CM | POA: Insufficient documentation

## 2019-04-14 DIAGNOSIS — I5032 Chronic diastolic (congestive) heart failure: Secondary | ICD-10-CM | POA: Diagnosis not present

## 2019-04-14 DIAGNOSIS — E785 Hyperlipidemia, unspecified: Secondary | ICD-10-CM | POA: Diagnosis not present

## 2019-04-14 DIAGNOSIS — Z833 Family history of diabetes mellitus: Secondary | ICD-10-CM | POA: Insufficient documentation

## 2019-04-14 DIAGNOSIS — I50810 Right heart failure, unspecified: Secondary | ICD-10-CM | POA: Diagnosis not present

## 2019-04-14 DIAGNOSIS — Z79899 Other long term (current) drug therapy: Secondary | ICD-10-CM | POA: Insufficient documentation

## 2019-04-14 DIAGNOSIS — Z791 Long term (current) use of non-steroidal anti-inflammatories (NSAID): Secondary | ICD-10-CM | POA: Diagnosis not present

## 2019-04-14 DIAGNOSIS — J45901 Unspecified asthma with (acute) exacerbation: Secondary | ICD-10-CM | POA: Insufficient documentation

## 2019-04-14 DIAGNOSIS — Z880 Allergy status to penicillin: Secondary | ICD-10-CM | POA: Diagnosis not present

## 2019-04-14 DIAGNOSIS — J9611 Chronic respiratory failure with hypoxia: Secondary | ICD-10-CM | POA: Diagnosis not present

## 2019-04-14 DIAGNOSIS — E662 Morbid (severe) obesity with alveolar hypoventilation: Secondary | ICD-10-CM | POA: Diagnosis not present

## 2019-04-14 DIAGNOSIS — L210 Seborrhea capitis: Secondary | ICD-10-CM | POA: Diagnosis not present

## 2019-04-14 DIAGNOSIS — F419 Anxiety disorder, unspecified: Secondary | ICD-10-CM | POA: Diagnosis not present

## 2019-04-14 DIAGNOSIS — M109 Gout, unspecified: Secondary | ICD-10-CM | POA: Insufficient documentation

## 2019-04-14 DIAGNOSIS — I350 Nonrheumatic aortic (valve) stenosis: Secondary | ICD-10-CM | POA: Diagnosis not present

## 2019-04-14 DIAGNOSIS — K219 Gastro-esophageal reflux disease without esophagitis: Secondary | ICD-10-CM | POA: Insufficient documentation

## 2019-04-14 DIAGNOSIS — G894 Chronic pain syndrome: Secondary | ICD-10-CM | POA: Insufficient documentation

## 2019-04-14 DIAGNOSIS — Z9851 Tubal ligation status: Secondary | ICD-10-CM | POA: Insufficient documentation

## 2019-04-14 DIAGNOSIS — D649 Anemia, unspecified: Secondary | ICD-10-CM | POA: Insufficient documentation

## 2019-04-14 NOTE — Patient Instructions (Signed)
Please give her the sleep lab called back and schedule to have your sleep study done so that you can get on a CPAP machine.

## 2019-04-14 NOTE — Progress Notes (Signed)
Pt states her whole body hurts  Pt states it is hard for her to breath

## 2019-04-14 NOTE — Progress Notes (Signed)
Patient ID: Kara Vincent, female    DOB: 1965/08/12  MRN: [id number]  CC: Hospitalization Follow-up (ED)   Subjective: Kara Vincent is a 54 y.o. female who presents for chronic ds management. 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  She thinks that she has swelling in her legs.  Reports shortness of breath with minimal movement.  She tells me that she had some chest pains yesterday. -Reports compliance with her medications including furosemide.  I checked a BMP on her last visit in November and it was normal.  She was seen in the emergency room last month with same complaint of having fluid buildup in her body.  Cardiac enzymes were negative.  D-dimer was positive.  CT of the chest was negative for blood clots.  BNP was normal.  Covid test was negative.  Kidney function was at her baseline with EGFR of 38.  Mild stable anemia on CBC.  Patient was given a dose of Lasix and discharged home. -She is on oxygen at home 3 L continuously.  She came out today without her oxygen and pulse ox was at 88% on room air.  We placed her on 2 L while she was here. -Referred for sleep study.  Patient states that she was called and offered appointment but she put off scheduling because of the pandemic and because "my legs throb."  OA of the knees: Complains of bilateral knee pain.  Again she is wanting to be placed on Indocin which I have told her in the past we cannot do because of her kidney function.  She is on Voltaren gel, Tylenol with codeine and Cymbalta.  She tells me that she has been out of her Cymbalta for several days.  She has  not had any falls.  She gets around in her motel room mainly with her motorized wheelchair.  Complains of some pain in the right ear for several days.  There is no drainage from the ear.  She has had no fever.  She also complains of itchy scalp and requesting medication for it.  Requesting refills on several medications including antifungal creams Patient Active Problem List   Diagnosis Date Noted  . Morbid obesity ([place]) 12/01/2017  . Primary osteoarthritis of both knees 12/01/2017  . Anxiety 12/01/2017  . Impaired mobility 12/01/2017  . Essential hypertension 12/14/2015  . HLD (hyperlipidemia) 12/14/2015  . GERD (gastroesophageal reflux disease) 12/14/2015  . Gout 12/14/2015  . Darier's disease   . Leukocytosis   . 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 Right-sided heart failure ([place]) 11/20/2014  . OSA (obstructive sleep apnea)   . CKD (chronic kidney disease) stage 3, GFR 30-59 ml/min (HCC) 11/17/2014  . Asthma exacerbation 11/16/2014  . Venous stasis 11/16/2014  . Acute on chronic respiratory failure with hypoxia and hypercapnia ([place]) 11/16/2014  . Chronic pain 08/24/2011  . Obesity hypoventilation syndrome ([place]) 08/24/2011     Current Outpatient Medications on File Prior to Visit  Medication Sig Dispense Refill  . acetaminophen-codeine (TYLENOL #3) 300-30 MG tablet Take 1 tablet by mouth every 8 (eight) hours as needed for moderate pain. Each prescription to last one month 90 tablet 0  . albuterol (  VENTOLIN HFA) 108 (90 Base) MCG/ACT inhaler Inhale 1-2 puffs into the lungs every 6 (six) hours as needed for wheezing or shortness of breath. 8 g 6  . allopurinol (ZYLOPRIM) 100 MG tablet Take 1 tablet (100 mg total) by mouth every morning. 30 tablet 6  . atorvastatin (LIPITOR) 20 MG tablet Take 1 tablet (20 mg total) by mouth every morning. 30 tablet 6  . bacitracin ointment Apply 1 application topically 2 (two) times daily. 120 g 0  . cephALEXin (KEFLEX) 500 MG capsule Take 1 capsule (500  mg total) by mouth 4 (four) times daily. 20 capsule 0  . cloNIDine (CATAPRES) 0.2 MG tablet Take 1 tablet (0.2 mg total) by mouth 2 (two) times daily. 60 tablet 6  . diclofenac sodium (VOLTAREN) 1 % GEL Apply 4 g topically 4 (four) times daily. 100 g 3  . DULoxetine (CYMBALTA) 20 MG capsule Take 1 capsule (20 mg total) by mouth daily. 60 capsule 3  . furosemide (LASIX) 40 MG tablet Take 1 tablet (40 mg total) by mouth daily. 1 tab po daily 60 tablet 3  . gabapentin (NEURONTIN) 100 MG capsule Take 100 mg by mouth 3 (three) times daily.    . hydrOXYzine (ATARAX/VISTARIL) 50 MG tablet TAKE ONE TABLET BY MOUTH THREE TIMES DAILY AS NEEDED FOR ANXIETY OR FOR ITCHING 60 tablet 1  . ipratropium-albuterol (DUONEB) 0.5-2.5 (3) MG/3ML SOLN Take 3 mLs by nebulization every 6 (six) hours as needed (SOB). 3 mL 0  . isosorbide mononitrate (IMDUR) 30 MG 24 hr tablet Take 1 tablet (30 mg total) by mouth daily. 30 tablet 6  . lisinopril (ZESTRIL) 40 MG tablet TAKE 1 TABLET(40 MG) BY MOUTH DAILY 30 tablet 0  . Misc. Devices MISC Please provide patient with a raised toilet seat that is covered under her current insurance plan. 1 each 0  . Misc. Devices MISC Heavy duty shower chair; Heavy duty potty chair. Dx: Osteoarthritis 1 each 0  . mupirocin cream (BACTROBAN) 2 % Apply to affected area twice a day 15 g 0  . nystatin (MYCOSTATIN/NYSTOP) powder Apply topically 4 (four) times daily. Apply beneath breast and below abdominal fold 60 g 5  . nystatin cream (MYCOSTATIN) APPLY TOPICALLY TO THE AFFECTED AREA(S) TWICE DAILY 60 g 0  . pantoprazole (PROTONIX) 40 MG tablet Take 1 tablet (40 mg total) by mouth daily. 30 tablet 5  . solifenacin (VESICARE) 5 MG tablet Take 1 tablet (5 mg total) by mouth every morning. 30 tablet 6  . triamcinolone cream (KENALOG) 0.1 % APPLY TOPICALLY TO THE rash on your Back WICE DAILY 80 g 1  . [DISCONTINUED] diphenhydrAMINE (SOMINEX) 25 MG tablet Take 25 mg by mouth at bedtime as needed.      No current facility-administered medications on file prior to visit.    Allergies  Allergen Reactions  . Ibuprofen Hives, Shortness Of Breath, Palpitations and Rash    Has tolerated toradol 12/2013 inpatient  . Penicillins Anaphylaxis, Hives and Rash    Has patient had a PCN reaction causing immediate rash, facial/tongue/throat swelling, SOB or lightheadedness with hypotension: Yes Has patient had a PCN reaction causing severe rash involving mucus membranes or skin necrosis: Yes Has patient had a PCN reaction that required hospitalization Unknown Has patient had a PCN reaction occurring within the last 10 years: Unknown If all of the above answers are "NO", then may proceed with Cephalosporin use.   . Sulfa Antibiotics Hives, Shortness Of Breath, Rash and Cough  . Adhesive [  Tape] Rash  . Doxycycline Hives, Swelling and Rash  . Sulfamethoxazole-Trimethoprim Rash  . Zithromax [Azithromycin] Hives, Swelling and Rash    Social History   Socioeconomic History  . Marital status: Single    Spouse name: Not on file  . Number of children: Not on file  . Years of education: Not on file  . Highest education level: Not on file  Occupational History  . Not on file  Tobacco Use  . Smoking status: Former Smoker    Types: Cigarettes    Quit date: 03/25/2003    Years since quitting: 16.0  . Smokeless tobacco: Former Systems developer    Quit date: 05/27/1978  Substance and Sexual Activity  . Alcohol use: Yes    Alcohol/week: 0.0 standard drinks    Comment: years ago  no longer beer only  early 20's  . Drug use: Yes    Types: "Crack" cocaine, Other-see comments    Comment: years  ago  . Sexual activity: Yes    Birth control/protection: Other-see comments, None, Abstinence  Other Topics Concern  . Not on file  Social History Narrative   ** Merged History Encounter **       Patient has exhausted [facility] financial resources. She does have access to [employer] and [employer]. She should be  redirected to purse disability with her attorney and reapply of Medicaid   Social Determinants of Health   Financial Resource Strain:   . Difficulty of Paying Living Expenses: Not on file  Food Insecurity:   . Worried About Charity fundraiser in the Last Year: Not on file  . Ran Out of Food in the Last Year: Not on file  Transportation Needs:   . Lack of Transportation (Medical): Not on file  . Lack of Transportation (Non-Medical): Not on file  Physical Activity:   . Days of Exercise per Week: Not on file  . Minutes of Exercise per Session: Not on file  Stress:   . Feeling of Stress : Not on file  Social Connections:   . Frequency of Communication with Friends and Family: Not on file  . Frequency of Social Gatherings with Friends and Family: Not on file  . Attends Religious Services: Not on file  . Active Member of Clubs or Organizations: Not on file  . Attends Archivist Meetings: Not on file  . Marital Status: Not on file  Intimate Partner Violence:   . Fear of Current or Ex-Partner: Not on file  . Emotionally Abused: Not on file  . Physically Abused: Not on file  . Sexually Abused: Not on file    Family History  Problem Relation Age of Onset  . Asthma Mother   . Diabetes Father   . Stroke Father   . Heart disease Father   . Arthritis Sister   . Asthma Sister   . Asthma Daughter   . Asthma Son   . Arthritis Sister   . Asthma Sister     Past Surgical History:  Procedure Laterality Date  . BACK SURGERY    . CESAREAN SECTION    . CESAREAN SECTION    . FRACTURE SURGERY    . TUBAL LIGATION      ROS: Review of Systems Negative except as stated above  PHYSICAL EXAM: BP (!) 86/59 (BP Location: Right Arm, Patient Position: Sitting, Cuff Size: Large)   Pulse 65   Temp 98.9 F (37.2 C) (Oral)   Resp 16   Wt (!) 390  lb (176.9 kg)   LMP 03/24/2009   SpO2 (!) 88%   BMI 64.90 kg/m    Wt Readings from Last 3 Encounters:  04/14/19 (!) 390 lb (176.9  kg)  12/17/18 (!) 392 lb (177.8 kg)  01/05/18 (!) 385 lb 6.4 oz (174.8 kg)    Pulse ox of 88% was on room air.  Patient placed on 2 L of O2. Physical Exam Repeat blood pressure was 110/60 General appearance -obese Caucasian female sitting in motorized wheelchair.  She is in no acute cardiopulmonary distress. Mental status -patient is alert and oriented.  She has questions and answers questions appropriately. Ears -she has had wax buildup in the right ear.  Left ear canal within normal limits Mouth - mucous membranes moist, pharynx normal without lesions Neck - supple, no significant adenopathy Chest -few fine crackles at the bases  heart - normal rate, regular rhythm, normal S1, S2, no murmurs, rubs, clicks or gallops.  Unable to get her on exam table to check for JVD Extremities -lower extremities are large but she has no pitting edema. Skin: She has some dandruff and excoriation on the scalp probably from scratching   CMP Latest Ref Rng & Units 03/11/2019 01/24/2019 05/21/2018  Glucose 70 - 99 mg/dL 130(H) 95 85  BUN 6 - 20 mg/dL 27(H) 23 19  Creatinine 0.44 - 1.00 mg/dL 1.54(H) 1.61(H) 1.39(H)  Sodium 135 - 145 mmol/L 142 144 144  Potassium 3.5 - 5.1 mmol/L 4.4 5.3(H) 3.9  Chloride 98 - 111 mmol/L 103 102 100  CO2 22 - 32 mmol/L 30 30(H) 26  Calcium 8.9 - 10.3 mg/dL 8.8(L) 8.6(L) 9.1  Total Protein 6.0 - 8.5 g/dL - 6.6 -  Total Bilirubin 0.0 - 1.2 mg/dL - 0.2 -  Alkaline Phos 39 - 117 IU/L - 73 -  AST 0 - 40 IU/L - 10 -  ALT 0 - 32 IU/L - 8 -   Lipid Panel     Component Value Date/Time   CHOL 190 12/01/2017 1541   TRIG 261 (H) 12/01/2017 1541   HDL 37 (L) 12/01/2017 1541   CHOLHDL 5.1 (H) 12/01/2017 1541   CHOLHDL 4.9 12/14/2015 0303   VLDL 26 12/14/2015 0303   LDLCALC 101 (H) 12/01/2017 1541    CBC    Component Value Date/Time   WBC 9.9 03/11/2019 1840   RBC 3.81 (L) 03/11/2019 1840   HGB 11.5 (L) 03/11/2019 1840   HGB 11.2 01/24/2019 1646   HCT 38.9 03/11/2019  1840   HCT 35.2 01/24/2019 1646   PLT 244 03/11/2019 1840   PLT 226 01/24/2019 1646   MCV 102.1 (H) 03/11/2019 1840   MCV 94 01/24/2019 1646   MCH 30.2 03/11/2019 1840   MCHC 29.6 (L) 03/11/2019 1840   RDW 13.6 03/11/2019 1840   RDW 12.8 01/24/2019 1646   LYMPHSABS 2.1 10/15/2017 2320   MONOABS 0.4 10/15/2017 2320   EOSABS 0.3 10/15/2017 2320   BASOSABS 0.0 10/15/2017 2320    ASSESSMENT AND PLAN:  1. Chronic diastolic heart failure ([place]) -Patient feeling that she is fluid overloaded.  She has a few fine crackles at the bases of the lungs but no lower extremity edema and no significant weight change. -Stressed importance of low-salt diet.  Continue furosemide -Advised that she can still go to the emergency room for further evaluation and consideration for admission.  Patient tells me she will go tonight or tomorrow - isosorbide mononitrate (IMDUR) 30 MG 24 hr tablet; Take 1 tablet (  30 mg total) by mouth daily.  Dispense: 30 tablet; Refill: 6 - Basic Metabolic Panel; Future - Brain natriuretic peptide; Future  2. Chronic hypoxemic respiratory failure (HCC) 3. OSA (obstructive sleep apnea) I recommend that she call the sleep lab back and get the appointment for her sleep study.  Advised that untreated OSA can make heart failure and hypoxia worse  4. Essential hypertension Repeat blood pressure was good.  She will continue her current medications - isosorbide mononitrate (IMDUR) 30 MG 24 hr tablet; Take 1 tablet (30 mg total) by mouth daily.  Dispense: 30 tablet; Refill: 6  5. Primary osteoarthritis of both knees Continue Tylenol with codeine. Increase Cymbalta from 20 mg to 30 mg daily. Informed her again that NSAIDs cannot be used because of her kidney function - DULoxetine (CYMBALTA) 30 MG capsule; Take 1 capsule (30 mg total) by mouth daily.  Dispense: 30 capsule; Refill: 5 - acetaminophen-codeine (TYLENOL #3) 300-30 MG tablet; Take 1 tablet by mouth every 8 (eight) hours as  needed for moderate pain. Each prescription to last one month  Dispense: 90 tablet; Refill: 0  6. Dandruff - ketoconazole (NIZORAL) 2 % shampoo; Apply 1 application topically 2 (two) times a week. Apply to hair for treatment of dandruff  Dispense: 120 mL; Refill: 0  7. Impacted cerumen of right ear Advised patient to use some wax softener in the ear and then she can return to have the ear flush by RN or my CMA.  Patient was given the opportunity to ask questions.  Patient verbalized understanding of the plan and was able to repeat key elements of the plan.   No orders of the defined types were placed in this encounter.    Requested Prescriptions    No prescriptions requested or ordered in this encounter    No follow-ups on file.  Karle Plumber, MD, FACP

## 2019-04-15 ENCOUNTER — Other Ambulatory Visit: Payer: Self-pay | Admitting: Internal Medicine

## 2019-04-15 MED ORDER — TRIAMCINOLONE ACETONIDE 0.1 % EX CREA: TOPICAL_CREAM | CUTANEOUS | 1 refills | Status: DC

## 2019-04-15 MED ORDER — DULOXETINE HCL 30 MG PO CPEP: 30.0000 mg | ORAL_CAPSULE | Freq: Every day | ORAL | 5 refills | Status: DC

## 2019-04-15 MED ORDER — NYSTATIN 100000 UNIT/GM EX POWD: Freq: Four times a day (QID) | CUTANEOUS | 5 refills | Status: DC

## 2019-04-15 MED ORDER — KETOCONAZOLE 2 % EX SHAM: 1.0000 "application " | MEDICATED_SHAMPOO | CUTANEOUS | 0 refills | Status: DC

## 2019-04-15 MED ORDER — ISOSORBIDE MONONITRATE ER 30 MG PO TB24: 30.0000 mg | ORAL_TABLET | Freq: Every day | ORAL | 6 refills | Status: AC

## 2019-04-15 MED ORDER — FUROSEMIDE 40 MG PO TABS: 40.0000 mg | ORAL_TABLET | Freq: Every day | ORAL | 3 refills | Status: DC

## 2019-04-15 MED ORDER — ACETAMINOPHEN-CODEINE #3 300-30 MG PO TABS: 1.0000 | ORAL_TABLET | Freq: Three times a day (TID) | ORAL | 0 refills | Status: DC | PRN

## 2019-04-20 IMAGING — CR DG CHEST 2V
2 series · 2 of 2 positions shown · non-contrast
Comparison: 07/18/2016

CLINICAL DATA: Fever and chest pain

EXAM:
CHEST  2 VIEW

[x chest ap]
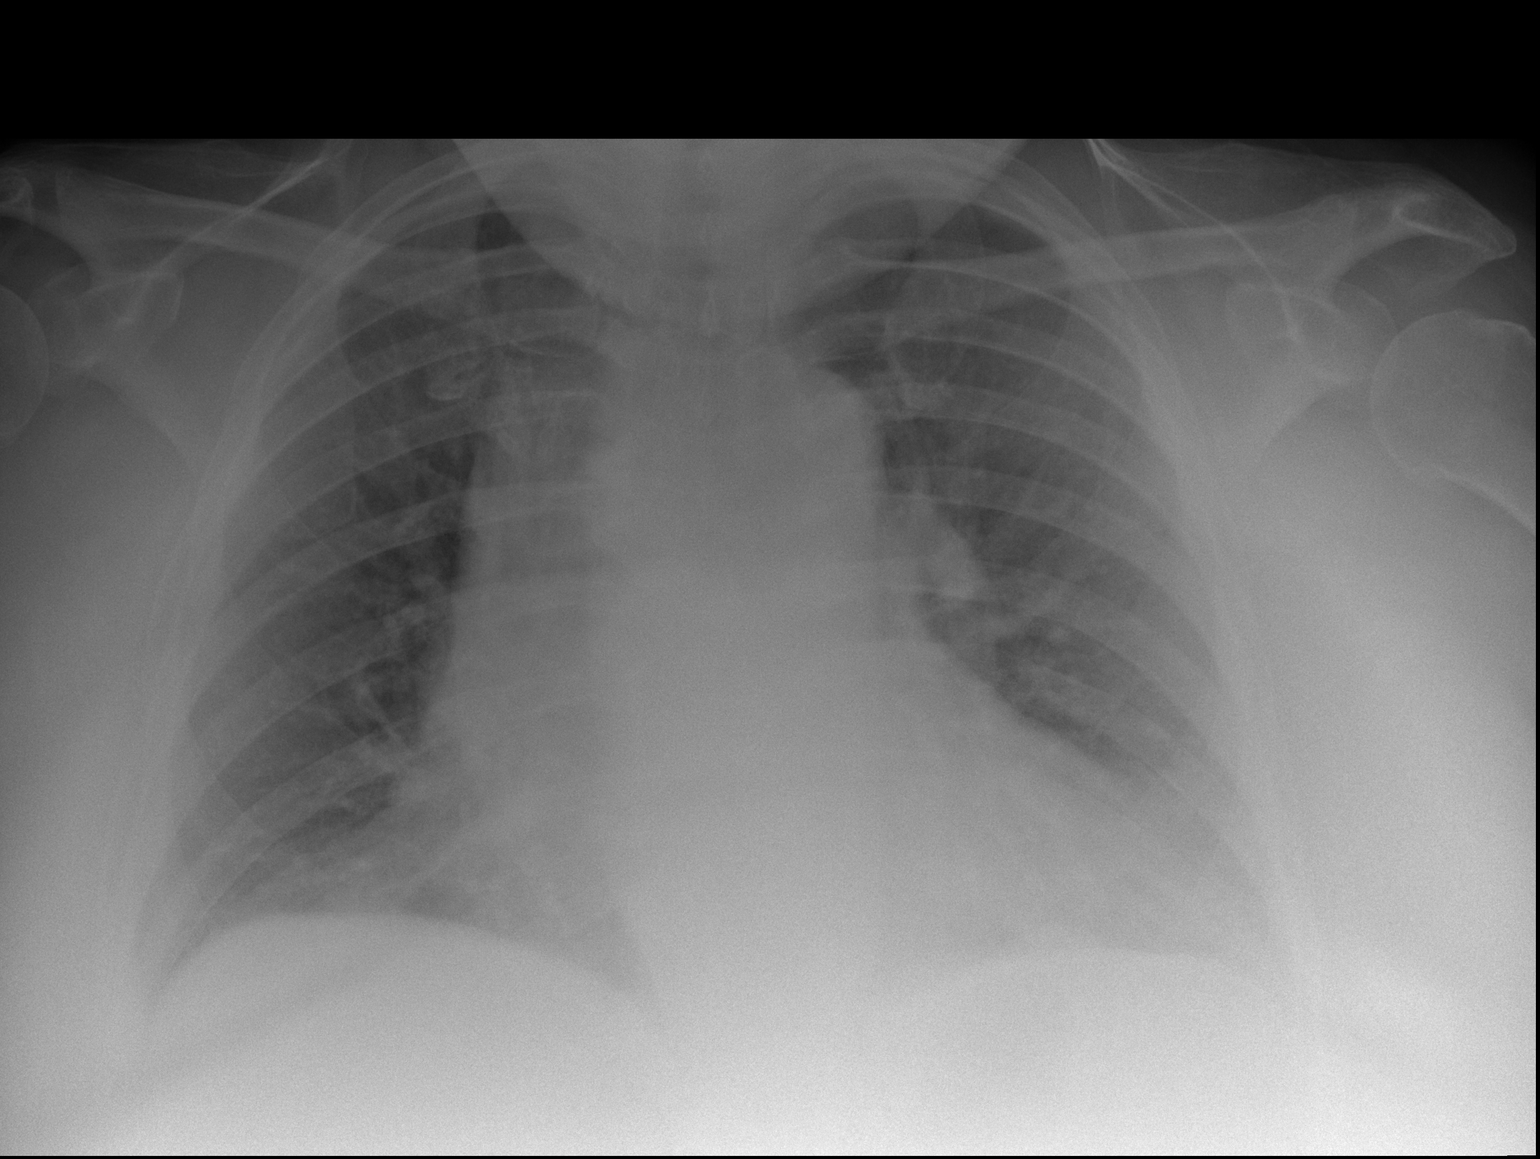

[w chest lat]
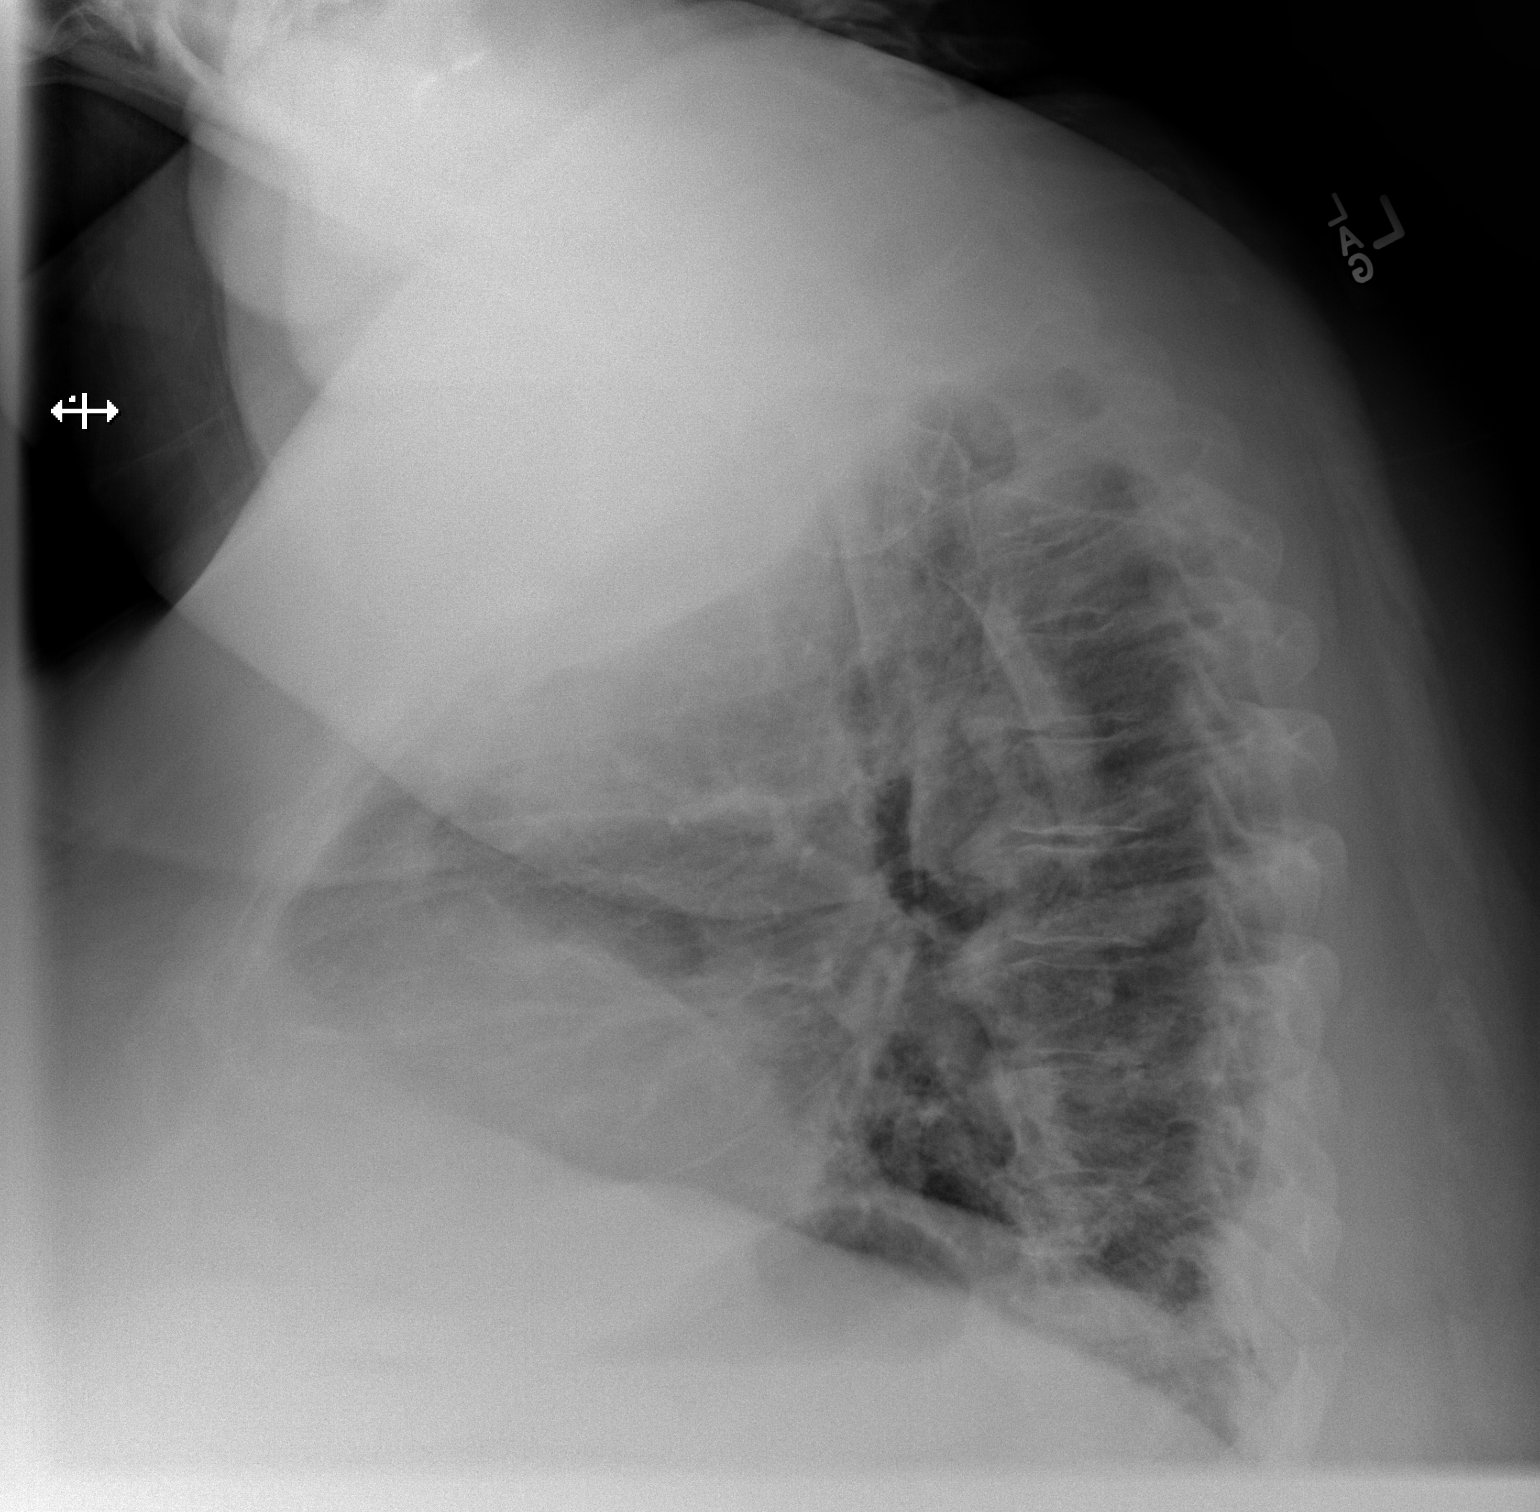

[2 of 2 positions shown; findings below may reference images not displayed]

FINDINGS: No pleural effusion. There is cardiomegaly. No focal consolidation.
Mild central vascular congestion. No pneumothorax. Degenerative
changes of the spine.
IMPRESSION: 1. Cardiomegaly with mild central vascular congestion. No focal
pulmonary infiltrate is seen.

## 2019-05-16 ENCOUNTER — Other Ambulatory Visit: Payer: Self-pay | Admitting: Internal Medicine

## 2019-05-17 ENCOUNTER — Other Ambulatory Visit: Payer: Self-pay | Admitting: Internal Medicine

## 2019-05-18 ENCOUNTER — Other Ambulatory Visit: Payer: Self-pay | Admitting: Internal Medicine

## 2019-05-24 ENCOUNTER — Telehealth: Payer: Self-pay | Admitting: Internal Medicine

## 2019-05-24 DIAGNOSIS — M170 Bilateral primary osteoarthritis of knee: Secondary | ICD-10-CM

## 2019-05-24 NOTE — Telephone Encounter (Signed)
Pt requesting refill for the following: acetaminophen-codeine (TYLENOL #3) 300-30 MG tablet [id number]  [facility]

## 2019-05-25 MED ORDER — ACETAMINOPHEN-CODEINE #3 300-30 MG PO TABS: 1.0000 | ORAL_TABLET | Freq: Three times a day (TID) | ORAL | 0 refills | Status: DC | PRN

## 2019-05-25 NOTE — Telephone Encounter (Signed)
Dr. Wynetta Emery -   Checked with Claiborne Billings in pharmacy. We have submitted the PA. It was submitted and denied previously but she will attempt to get approval again.

## 2019-05-26 ENCOUNTER — Telehealth: Payer: Self-pay

## 2019-05-26 NOTE — Telephone Encounter (Signed)
Tylenol #3 prior auth approved thru 10/25/19

## 2019-06-07 ENCOUNTER — Other Ambulatory Visit: Payer: Self-pay | Admitting: Internal Medicine

## 2019-06-08 ENCOUNTER — Other Ambulatory Visit: Payer: Self-pay | Admitting: Nephrology

## 2019-06-08 DIAGNOSIS — N183 Chronic kidney disease, stage 3 unspecified: Secondary | ICD-10-CM

## 2019-06-13 ENCOUNTER — Inpatient Hospital Stay (HOSPITAL_COMMUNITY)
Admission: EM | Admit: 2019-06-13 | Discharge: 2019-06-30 | DRG: 602 | Disposition: A | Discharge status: Home / Self Care | Payer: Medicaid Other | Attending: Internal Medicine | Admitting: Internal Medicine

## 2019-06-13 ENCOUNTER — Emergency Department (HOSPITAL_COMMUNITY): Payer: Medicaid Other

## 2019-06-13 ENCOUNTER — Other Ambulatory Visit: Payer: Self-pay | Admitting: Internal Medicine

## 2019-06-13 ENCOUNTER — Ambulatory Visit: Payer: Medicaid Other

## 2019-06-13 DIAGNOSIS — R253 Fasciculation: Secondary | ICD-10-CM | POA: Diagnosis present

## 2019-06-13 DIAGNOSIS — N1832 Chronic kidney disease, stage 3b: Secondary | ICD-10-CM | POA: Diagnosis present

## 2019-06-13 DIAGNOSIS — R062 Wheezing: Secondary | ICD-10-CM

## 2019-06-13 DIAGNOSIS — R0602 Shortness of breath: Secondary | ICD-10-CM

## 2019-06-13 DIAGNOSIS — J9622 Acute and chronic respiratory failure with hypercapnia: Secondary | ICD-10-CM | POA: Diagnosis present

## 2019-06-13 DIAGNOSIS — L03116 Cellulitis of left lower limb: Secondary | ICD-10-CM | POA: Diagnosis present

## 2019-06-13 DIAGNOSIS — Z825 Family history of asthma and other chronic lower respiratory diseases: Secondary | ICD-10-CM

## 2019-06-13 DIAGNOSIS — Z888 Allergy status to other drugs, medicaments and biological substances status: Secondary | ICD-10-CM

## 2019-06-13 DIAGNOSIS — Z8249 Family history of ischemic heart disease and other diseases of the circulatory system: Secondary | ICD-10-CM

## 2019-06-13 DIAGNOSIS — Z590 Homelessness: Secondary | ICD-10-CM

## 2019-06-13 DIAGNOSIS — J45901 Unspecified asthma with (acute) exacerbation: Secondary | ICD-10-CM | POA: Diagnosis present

## 2019-06-13 DIAGNOSIS — Z79899 Other long term (current) drug therapy: Secondary | ICD-10-CM

## 2019-06-13 DIAGNOSIS — J9601 Acute respiratory failure with hypoxia: Secondary | ICD-10-CM | POA: Diagnosis present

## 2019-06-13 DIAGNOSIS — N183 Chronic kidney disease, stage 3 unspecified: Secondary | ICD-10-CM | POA: Diagnosis present

## 2019-06-13 DIAGNOSIS — Z6841 Body Mass Index (BMI) 40.0 and over, adult: Secondary | ICD-10-CM

## 2019-06-13 DIAGNOSIS — Z823 Family history of stroke: Secondary | ICD-10-CM

## 2019-06-13 DIAGNOSIS — L210 Seborrhea capitis: Secondary | ICD-10-CM

## 2019-06-13 DIAGNOSIS — R059 Cough, unspecified: Secondary | ICD-10-CM

## 2019-06-13 DIAGNOSIS — Z9119 Patient's noncompliance with other medical treatment and regimen: Secondary | ICD-10-CM

## 2019-06-13 DIAGNOSIS — B372 Candidiasis of skin and nail: Secondary | ICD-10-CM | POA: Diagnosis present

## 2019-06-13 DIAGNOSIS — G253 Myoclonus: Secondary | ICD-10-CM | POA: Diagnosis present

## 2019-06-13 DIAGNOSIS — M170 Bilateral primary osteoarthritis of knee: Secondary | ICD-10-CM

## 2019-06-13 DIAGNOSIS — Z8261 Family history of arthritis: Secondary | ICD-10-CM

## 2019-06-13 DIAGNOSIS — J9811 Atelectasis: Secondary | ICD-10-CM | POA: Diagnosis not present

## 2019-06-13 DIAGNOSIS — R05 Cough: Secondary | ICD-10-CM

## 2019-06-13 DIAGNOSIS — G4733 Obstructive sleep apnea (adult) (pediatric): Secondary | ICD-10-CM | POA: Diagnosis present

## 2019-06-13 DIAGNOSIS — M109 Gout, unspecified: Secondary | ICD-10-CM | POA: Diagnosis present

## 2019-06-13 DIAGNOSIS — L0390 Cellulitis, unspecified: Secondary | ICD-10-CM | POA: Diagnosis present

## 2019-06-13 DIAGNOSIS — E875 Hyperkalemia: Secondary | ICD-10-CM | POA: Diagnosis present

## 2019-06-13 DIAGNOSIS — E785 Hyperlipidemia, unspecified: Secondary | ICD-10-CM | POA: Diagnosis present

## 2019-06-13 DIAGNOSIS — E662 Morbid (severe) obesity with alveolar hypoventilation: Secondary | ICD-10-CM | POA: Diagnosis present

## 2019-06-13 DIAGNOSIS — I5033 Acute on chronic diastolic (congestive) heart failure: Secondary | ICD-10-CM | POA: Diagnosis present

## 2019-06-13 DIAGNOSIS — Z87891 Personal history of nicotine dependence: Secondary | ICD-10-CM

## 2019-06-13 DIAGNOSIS — Z9981 Dependence on supplemental oxygen: Secondary | ICD-10-CM

## 2019-06-13 DIAGNOSIS — L03115 Cellulitis of right lower limb: Principal | ICD-10-CM | POA: Diagnosis present

## 2019-06-13 DIAGNOSIS — I10 Essential (primary) hypertension: Secondary | ICD-10-CM | POA: Diagnosis present

## 2019-06-13 DIAGNOSIS — J9621 Acute and chronic respiratory failure with hypoxia: Secondary | ICD-10-CM | POA: Diagnosis present

## 2019-06-13 DIAGNOSIS — J189 Pneumonia, unspecified organism: Secondary | ICD-10-CM | POA: Diagnosis present

## 2019-06-13 DIAGNOSIS — R109 Unspecified abdominal pain: Secondary | ICD-10-CM

## 2019-06-13 DIAGNOSIS — Z833 Family history of diabetes mellitus: Secondary | ICD-10-CM

## 2019-06-13 DIAGNOSIS — E1122 Type 2 diabetes mellitus with diabetic chronic kidney disease: Secondary | ICD-10-CM | POA: Diagnosis present

## 2019-06-13 DIAGNOSIS — D631 Anemia in chronic kidney disease: Secondary | ICD-10-CM | POA: Diagnosis present

## 2019-06-13 DIAGNOSIS — I5032 Chronic diastolic (congestive) heart failure: Secondary | ICD-10-CM

## 2019-06-13 DIAGNOSIS — K219 Gastro-esophageal reflux disease without esophagitis: Secondary | ICD-10-CM | POA: Diagnosis present

## 2019-06-13 DIAGNOSIS — Z91048 Other nonmedicinal substance allergy status: Secondary | ICD-10-CM

## 2019-06-13 DIAGNOSIS — Z20822 Contact with and (suspected) exposure to covid-19: Secondary | ICD-10-CM | POA: Diagnosis present

## 2019-06-13 DIAGNOSIS — Z880 Allergy status to penicillin: Secondary | ICD-10-CM

## 2019-06-13 DIAGNOSIS — R52 Pain, unspecified: Secondary | ICD-10-CM

## 2019-06-13 DIAGNOSIS — F419 Anxiety disorder, unspecified: Secondary | ICD-10-CM | POA: Diagnosis present

## 2019-06-13 DIAGNOSIS — M793 Panniculitis, unspecified: Secondary | ICD-10-CM | POA: Diagnosis present

## 2019-06-13 DIAGNOSIS — I2781 Cor pulmonale (chronic): Secondary | ICD-10-CM | POA: Diagnosis present

## 2019-06-13 DIAGNOSIS — E1165 Type 2 diabetes mellitus with hyperglycemia: Secondary | ICD-10-CM | POA: Diagnosis present

## 2019-06-13 DIAGNOSIS — Z882 Allergy status to sulfonamides status: Secondary | ICD-10-CM

## 2019-06-13 DIAGNOSIS — G894 Chronic pain syndrome: Secondary | ICD-10-CM | POA: Diagnosis present

## 2019-06-13 DIAGNOSIS — I130 Hypertensive heart and chronic kidney disease with heart failure and stage 1 through stage 4 chronic kidney disease, or unspecified chronic kidney disease: Secondary | ICD-10-CM | POA: Diagnosis present

## 2019-06-13 LAB — COMPREHENSIVE METABOLIC PANEL
ALT: 14 U/L (ref 0–44)
AST: 18 U/L (ref 15–41)
Albumin: 3.1 g/dL — ABNORMAL LOW (ref 3.5–5.0)
Alkaline Phosphatase: 66 U/L (ref 38–126)
Anion gap: 9 (ref 5–15)
BUN: 36 mg/dL — ABNORMAL HIGH (ref 6–20)
CO2: 28 mmol/L (ref 22–32)
Calcium: 8.4 mg/dL — ABNORMAL LOW (ref 8.9–10.3)
Chloride: 103 mmol/L (ref 98–111)
Creatinine, Ser: 1.85 mg/dL — ABNORMAL HIGH (ref 0.44–1.00)
GFR calc Af Amer: 35 mL/min — ABNORMAL LOW (ref >60)
GFR calc non Af Amer: 30 mL/min — ABNORMAL LOW (ref >60)
Glucose, Bld: 134 mg/dL — ABNORMAL HIGH (ref 70–99)
Potassium: 5 mmol/L (ref 3.5–5.1)
Sodium: 140 mmol/L (ref 135–145)
Total Bilirubin: 0.5 mg/dL (ref 0.3–1.2)
Total Protein: 7.1 g/dL (ref 6.5–8.1)

## 2019-06-13 LAB — CBC
HCT: 36 % (ref 36.0–46.0)
Hemoglobin: 10.6 g/dL — ABNORMAL LOW (ref 12.0–15.0)
MCH: 29.4 pg (ref 26.0–34.0)
MCHC: 29.4 g/dL — ABNORMAL LOW (ref 30.0–36.0)
MCV: 99.7 fL (ref 80.0–100.0)
Platelets: 267 10*3/uL (ref 150–400)
RBC: 3.61 MIL/uL — ABNORMAL LOW (ref 3.87–5.11)
RDW: 14.6 % (ref 11.5–15.5)
WBC: 11.4 10*3/uL — ABNORMAL HIGH (ref 4.0–10.5)
nRBC: 0 % (ref 0.0–0.2)

## 2019-06-13 LAB — LACTIC ACID, PLASMA: Lactic Acid, Venous: 0.8 mmol/L (ref 0.5–1.9)

## 2019-06-13 NOTE — ED Notes (Signed)
Pt oxygen drop 88 put pt on 2L of ox in waiting rm

## 2019-06-13 NOTE — ED Triage Notes (Signed)
Pt here with c/o right leg pain , leg is red and swollen , pt states that leg has been like that for about 1 week , alog with some abd pain and sob

## 2019-06-14 ENCOUNTER — Inpatient Hospital Stay (HOSPITAL_COMMUNITY): Payer: Medicaid Other

## 2019-06-14 ENCOUNTER — Emergency Department (HOSPITAL_COMMUNITY): Payer: Medicaid Other

## 2019-06-14 ENCOUNTER — Encounter (HOSPITAL_COMMUNITY): Payer: Self-pay | Admitting: Internal Medicine

## 2019-06-14 DIAGNOSIS — M109 Gout, unspecified: Secondary | ICD-10-CM | POA: Diagnosis present

## 2019-06-14 DIAGNOSIS — Z880 Allergy status to penicillin: Secondary | ICD-10-CM | POA: Diagnosis not present

## 2019-06-14 DIAGNOSIS — N1832 Chronic kidney disease, stage 3b: Secondary | ICD-10-CM | POA: Diagnosis present

## 2019-06-14 DIAGNOSIS — E1165 Type 2 diabetes mellitus with hyperglycemia: Secondary | ICD-10-CM | POA: Diagnosis present

## 2019-06-14 DIAGNOSIS — R609 Edema, unspecified: Secondary | ICD-10-CM

## 2019-06-14 DIAGNOSIS — R252 Cramp and spasm: Secondary | ICD-10-CM | POA: Diagnosis not present

## 2019-06-14 DIAGNOSIS — M793 Panniculitis, unspecified: Secondary | ICD-10-CM | POA: Diagnosis present

## 2019-06-14 DIAGNOSIS — J9612 Chronic respiratory failure with hypercapnia: Secondary | ICD-10-CM | POA: Diagnosis not present

## 2019-06-14 DIAGNOSIS — L0390 Cellulitis, unspecified: Secondary | ICD-10-CM | POA: Diagnosis present

## 2019-06-14 DIAGNOSIS — E785 Hyperlipidemia, unspecified: Secondary | ICD-10-CM | POA: Diagnosis present

## 2019-06-14 DIAGNOSIS — R05 Cough: Secondary | ICD-10-CM | POA: Diagnosis not present

## 2019-06-14 DIAGNOSIS — R0602 Shortness of breath: Secondary | ICD-10-CM | POA: Diagnosis not present

## 2019-06-14 DIAGNOSIS — L03115 Cellulitis of right lower limb: Secondary | ICD-10-CM | POA: Diagnosis present

## 2019-06-14 DIAGNOSIS — E1122 Type 2 diabetes mellitus with diabetic chronic kidney disease: Secondary | ICD-10-CM | POA: Diagnosis present

## 2019-06-14 DIAGNOSIS — F419 Anxiety disorder, unspecified: Secondary | ICD-10-CM | POA: Diagnosis present

## 2019-06-14 DIAGNOSIS — Z6841 Body Mass Index (BMI) 40.0 and over, adult: Secondary | ICD-10-CM | POA: Diagnosis not present

## 2019-06-14 DIAGNOSIS — I10 Essential (primary) hypertension: Secondary | ICD-10-CM

## 2019-06-14 DIAGNOSIS — J9621 Acute and chronic respiratory failure with hypoxia: Secondary | ICD-10-CM | POA: Diagnosis present

## 2019-06-14 DIAGNOSIS — J9601 Acute respiratory failure with hypoxia: Secondary | ICD-10-CM | POA: Diagnosis present

## 2019-06-14 DIAGNOSIS — J9811 Atelectasis: Secondary | ICD-10-CM | POA: Diagnosis not present

## 2019-06-14 DIAGNOSIS — J189 Pneumonia, unspecified organism: Secondary | ICD-10-CM | POA: Diagnosis present

## 2019-06-14 DIAGNOSIS — J45901 Unspecified asthma with (acute) exacerbation: Secondary | ICD-10-CM | POA: Diagnosis present

## 2019-06-14 DIAGNOSIS — I5033 Acute on chronic diastolic (congestive) heart failure: Secondary | ICD-10-CM | POA: Diagnosis present

## 2019-06-14 DIAGNOSIS — K219 Gastro-esophageal reflux disease without esophagitis: Secondary | ICD-10-CM | POA: Diagnosis present

## 2019-06-14 DIAGNOSIS — R52 Pain, unspecified: Secondary | ICD-10-CM | POA: Diagnosis not present

## 2019-06-14 DIAGNOSIS — I130 Hypertensive heart and chronic kidney disease with heart failure and stage 1 through stage 4 chronic kidney disease, or unspecified chronic kidney disease: Secondary | ICD-10-CM | POA: Diagnosis present

## 2019-06-14 DIAGNOSIS — E662 Morbid (severe) obesity with alveolar hypoventilation: Secondary | ICD-10-CM | POA: Diagnosis present

## 2019-06-14 DIAGNOSIS — E875 Hyperkalemia: Secondary | ICD-10-CM | POA: Diagnosis present

## 2019-06-14 DIAGNOSIS — Z20822 Contact with and (suspected) exposure to covid-19: Secondary | ICD-10-CM | POA: Diagnosis present

## 2019-06-14 DIAGNOSIS — J9622 Acute and chronic respiratory failure with hypercapnia: Secondary | ICD-10-CM | POA: Diagnosis present

## 2019-06-14 DIAGNOSIS — L03116 Cellulitis of left lower limb: Secondary | ICD-10-CM | POA: Diagnosis present

## 2019-06-14 DIAGNOSIS — R278 Other lack of coordination: Secondary | ICD-10-CM | POA: Diagnosis not present

## 2019-06-14 DIAGNOSIS — R062 Wheezing: Secondary | ICD-10-CM | POA: Diagnosis not present

## 2019-06-14 DIAGNOSIS — B372 Candidiasis of skin and nail: Secondary | ICD-10-CM | POA: Diagnosis present

## 2019-06-14 DIAGNOSIS — Z9119 Patient's noncompliance with other medical treatment and regimen: Secondary | ICD-10-CM | POA: Diagnosis not present

## 2019-06-14 DIAGNOSIS — G4733 Obstructive sleep apnea (adult) (pediatric): Secondary | ICD-10-CM | POA: Diagnosis not present

## 2019-06-14 DIAGNOSIS — Z87891 Personal history of nicotine dependence: Secondary | ICD-10-CM | POA: Diagnosis not present

## 2019-06-14 LAB — CBC
HCT: 33.7 % — ABNORMAL LOW (ref 36.0–46.0)
Hemoglobin: 9.6 g/dL — ABNORMAL LOW (ref 12.0–15.0)
MCH: 28.8 pg (ref 26.0–34.0)
MCHC: 28.5 g/dL — ABNORMAL LOW (ref 30.0–36.0)
MCV: 101.2 fL — ABNORMAL HIGH (ref 80.0–100.0)
Platelets: 233 10*3/uL (ref 150–400)
RBC: 3.33 MIL/uL — ABNORMAL LOW (ref 3.87–5.11)
RDW: 14.5 % (ref 11.5–15.5)
WBC: 12.4 10*3/uL — ABNORMAL HIGH (ref 4.0–10.5)
nRBC: 0 % (ref 0.0–0.2)

## 2019-06-14 LAB — CREATININE, SERUM
Creatinine, Ser: 1.68 mg/dL — ABNORMAL HIGH (ref 0.44–1.00)
GFR calc Af Amer: 39 mL/min — ABNORMAL LOW (ref >60)
GFR calc non Af Amer: 34 mL/min — ABNORMAL LOW (ref >60)

## 2019-06-14 LAB — LACTIC ACID, PLASMA: Lactic Acid, Venous: 0.7 mmol/L (ref 0.5–1.9)

## 2019-06-14 LAB — HIV ANTIBODY (ROUTINE TESTING W REFLEX): HIV Screen 4th Generation wRfx: NONREACTIVE

## 2019-06-14 LAB — BRAIN NATRIURETIC PEPTIDE: B Natriuretic Peptide: 120.9 pg/mL — ABNORMAL HIGH (ref 0.0–100.0)

## 2019-06-14 LAB — TROPONIN I (HIGH SENSITIVITY)
Troponin I (High Sensitivity): 3 ng/L (ref <18)
Troponin I (High Sensitivity): 4 ng/L (ref <18)

## 2019-06-14 LAB — MRSA PCR SCREENING: MRSA by PCR: NEGATIVE

## 2019-06-14 LAB — SARS CORONAVIRUS 2 (TAT 6-24 HRS): SARS Coronavirus 2: NEGATIVE

## 2019-06-14 MED ORDER — SODIUM CHLORIDE 0.9 % IV SOLN
1.0000 g | INTRAVENOUS | Status: DC
  Administered 2019-06-14 – 2019-06-20 (×7): 1 g via INTRAVENOUS
  Filled 2019-06-14 (×2): qty 1
  Filled 2019-06-14 (×3): qty 10
  Filled 2019-06-14: qty 1
  Filled 2019-06-14: qty 10
  Filled 2019-06-14: qty 1

## 2019-06-14 MED ORDER — IPRATROPIUM BROMIDE HFA 17 MCG/ACT IN AERS
2.0000 | INHALATION_SPRAY | Freq: Once | RESPIRATORY_TRACT | Status: AC
  Administered 2019-06-14: 2 via RESPIRATORY_TRACT
  Filled 2019-06-14: qty 12.9

## 2019-06-14 MED ORDER — NYSTATIN 100000 UNIT/GM EX POWD
Freq: Once | CUTANEOUS | Status: AC
  Filled 2019-06-14: qty 15

## 2019-06-14 MED ORDER — CLONIDINE HCL 0.2 MG PO TABS
0.2000 mg | ORAL_TABLET | Freq: Two times a day (BID) | ORAL | Status: DC
  Administered 2019-06-14 – 2019-06-30 (×31): 0.2 mg via ORAL
  Filled 2019-06-14 (×32): qty 1

## 2019-06-14 MED ORDER — ACETAMINOPHEN-CODEINE #3 300-30 MG PO TABS
1.0000 | ORAL_TABLET | Freq: Once | ORAL | Status: AC
  Administered 2019-06-14: 1 via ORAL
  Filled 2019-06-14: qty 1

## 2019-06-14 MED ORDER — FUROSEMIDE 10 MG/ML IJ SOLN: 40.0000 mg | Freq: Once | INTRAMUSCULAR | Status: DC

## 2019-06-14 MED ORDER — ACETAMINOPHEN 325 MG PO TABS
650.0000 mg | ORAL_TABLET | Freq: Once | ORAL | Status: AC
  Administered 2019-06-14: 650 mg via ORAL
  Filled 2019-06-14: qty 2

## 2019-06-14 MED ORDER — OXYCODONE-ACETAMINOPHEN 5-325 MG PO TABS
2.0000 | ORAL_TABLET | Freq: Four times a day (QID) | ORAL | Status: DC | PRN
  Administered 2019-06-14 – 2019-06-23 (×21): 2 via ORAL
  Filled 2019-06-14 (×21): qty 2

## 2019-06-14 MED ORDER — ACETAMINOPHEN-CODEINE #3 300-30 MG PO TABS
1.0000 | ORAL_TABLET | Freq: Three times a day (TID) | ORAL | Status: DC | PRN
  Administered 2019-06-15 – 2019-06-29 (×13): 1 via ORAL
  Filled 2019-06-14 (×15): qty 1

## 2019-06-14 MED ORDER — AEROCHAMBER PLUS FLO-VU LARGE MISC
1.0000 | Freq: Once | Status: AC
  Administered 2019-06-14: 1

## 2019-06-14 MED ORDER — DULOXETINE HCL 30 MG PO CPEP
30.0000 mg | ORAL_CAPSULE | Freq: Every day | ORAL | Status: DC
  Administered 2019-06-14 – 2019-06-30 (×13): 30 mg via ORAL
  Filled 2019-06-14 (×17): qty 1

## 2019-06-14 MED ORDER — ALBUTEROL SULFATE (2.5 MG/3ML) 0.083% IN NEBU
2.5000 mg | INHALATION_SOLUTION | RESPIRATORY_TRACT | Status: DC | PRN
  Administered 2019-06-15 – 2019-06-28 (×5): 2.5 mg via RESPIRATORY_TRACT
  Filled 2019-06-14 (×6): qty 3

## 2019-06-14 MED ORDER — ISOSORBIDE MONONITRATE ER 30 MG PO TB24
30.0000 mg | ORAL_TABLET | Freq: Every day | ORAL | Status: DC
  Administered 2019-06-14 – 2019-06-30 (×17): 30 mg via ORAL
  Filled 2019-06-14 (×18): qty 1

## 2019-06-14 MED ORDER — ALBUTEROL SULFATE HFA 108 (90 BASE) MCG/ACT IN AERS
8.0000 | INHALATION_SPRAY | Freq: Once | RESPIRATORY_TRACT | Status: AC
  Administered 2019-06-14: 8 via RESPIRATORY_TRACT
  Filled 2019-06-14: qty 6.7

## 2019-06-14 MED ORDER — FUROSEMIDE 40 MG PO TABS
40.0000 mg | ORAL_TABLET | Freq: Two times a day (BID) | ORAL | Status: DC
  Administered 2019-06-14 – 2019-06-16 (×6): 40 mg via ORAL
  Filled 2019-06-14: qty 1
  Filled 2019-06-14: qty 2
  Filled 2019-06-14 (×5): qty 1

## 2019-06-14 MED ORDER — VITAMIN E 45 MG (100 UNIT) PO CAPS
100.0000 [IU] | ORAL_CAPSULE | Freq: Every day | ORAL | Status: DC
  Administered 2019-06-14 – 2019-06-30 (×17): 100 [IU] via ORAL
  Filled 2019-06-14 (×18): qty 1

## 2019-06-14 MED ORDER — ALBUTEROL SULFATE (2.5 MG/3ML) 0.083% IN NEBU
2.5000 mg | INHALATION_SOLUTION | RESPIRATORY_TRACT | Status: DC
  Filled 2019-06-14: qty 3

## 2019-06-14 MED ORDER — ACETAMINOPHEN 650 MG RE SUPP: 650.0000 mg | Freq: Four times a day (QID) | RECTAL | Status: DC | PRN

## 2019-06-14 MED ORDER — PANTOPRAZOLE SODIUM 40 MG PO TBEC
40.0000 mg | DELAYED_RELEASE_TABLET | Freq: Every day | ORAL | Status: DC
  Administered 2019-06-14 – 2019-06-28 (×15): 40 mg via ORAL
  Filled 2019-06-14 (×16): qty 1

## 2019-06-14 MED ORDER — ENOXAPARIN SODIUM 40 MG/0.4ML ~~LOC~~ SOLN
40.0000 mg | SUBCUTANEOUS | Status: DC
  Administered 2019-06-14 – 2019-06-16 (×2): 40 mg via SUBCUTANEOUS
  Filled 2019-06-14 (×3): qty 0.4

## 2019-06-14 MED ORDER — IOHEXOL 350 MG/ML SOLN
75.0000 mL | Freq: Once | INTRAVENOUS | Status: AC | PRN
  Administered 2019-06-14: 75 mL via INTRAVENOUS

## 2019-06-14 MED ORDER — ALLOPURINOL 100 MG PO TABS
100.0000 mg | ORAL_TABLET | Freq: Every day | ORAL | Status: DC
  Administered 2019-06-14 – 2019-06-30 (×17): 100 mg via ORAL
  Filled 2019-06-14 (×18): qty 1

## 2019-06-14 MED ORDER — TRIAMCINOLONE ACETONIDE 0.1 % EX CREA
TOPICAL_CREAM | Freq: Two times a day (BID) | CUTANEOUS | Status: DC
  Filled 2019-06-14 (×10): qty 15

## 2019-06-14 MED ORDER — SODIUM CHLORIDE 0.9 % IV SOLN
1.0000 g | Freq: Once | INTRAVENOUS | Status: AC
  Administered 2019-06-14: 1 g via INTRAVENOUS
  Filled 2019-06-14: qty 10

## 2019-06-14 MED ORDER — OMEGA-3-ACID ETHYL ESTERS 1 G PO CAPS
1.0000 g | ORAL_CAPSULE | Freq: Every day | ORAL | Status: DC
  Administered 2019-06-14 – 2019-06-30 (×17): 1 g via ORAL
  Filled 2019-06-14 (×18): qty 1

## 2019-06-14 MED ORDER — HYDROXYZINE HCL 25 MG PO TABS
50.0000 mg | ORAL_TABLET | Freq: Three times a day (TID) | ORAL | Status: DC | PRN
  Administered 2019-06-15 – 2019-06-30 (×23): 50 mg via ORAL
  Filled 2019-06-14 (×23): qty 2

## 2019-06-14 MED ORDER — VANCOMYCIN HCL 2000 MG/400ML IV SOLN
2000.0000 mg | Freq: Once | INTRAVENOUS | Status: AC
  Administered 2019-06-14: 2000 mg via INTRAVENOUS
  Filled 2019-06-14: qty 400

## 2019-06-14 MED ORDER — ACETAMINOPHEN 325 MG PO TABS
650.0000 mg | ORAL_TABLET | Freq: Four times a day (QID) | ORAL | Status: DC | PRN
  Administered 2019-06-18 – 2019-06-27 (×4): 650 mg via ORAL
  Filled 2019-06-14 (×6): qty 2

## 2019-06-14 MED ORDER — LISINOPRIL 40 MG PO TABS
40.0000 mg | ORAL_TABLET | Freq: Every day | ORAL | Status: DC
  Administered 2019-06-14 – 2019-06-17 (×4): 40 mg via ORAL
  Filled 2019-06-14 (×3): qty 1
  Filled 2019-06-14: qty 2

## 2019-06-14 MED ORDER — OXYCODONE-ACETAMINOPHEN 5-325 MG PO TABS
1.0000 | ORAL_TABLET | Freq: Once | ORAL | Status: AC
  Administered 2019-06-14: 1 via ORAL
  Filled 2019-06-14: qty 1

## 2019-06-14 MED ORDER — DARIFENACIN HYDROBROMIDE ER 7.5 MG PO TB24
7.5000 mg | ORAL_TABLET | Freq: Every day | ORAL | Status: DC
  Administered 2019-06-14 – 2019-06-30 (×17): 7.5 mg via ORAL
  Filled 2019-06-14 (×19): qty 1

## 2019-06-14 NOTE — ED Notes (Signed)
Lunch Tray Ordered @ 1028. 

## 2019-06-14 NOTE — Progress Notes (Signed)
Pharmacy Antibiotic Note  Kara Vincent is a 54 y.o. female admitted on 06/13/2019 with cellulitis.  Pharmacy has been consulted for vancomycin dosing.  Plan: Vancomycin 2gm IV q36 hours F/u renal function, cultures and clinical course     Temp (24hrs), Avg:98.6 F (37 C), Min:98.3 F (36.8 C), Max:98.8 F (37.1 C)  Recent Labs  Lab 06/13/19 1901 06/13/19 1902 06/14/19 0044  WBC 11.4*  --   --   CREATININE 1.85*  --   --   LATICACIDVEN  --  0.8 0.7    CrCl cannot be calculated (Unknown ideal weight.).    Allergies  Allergen Reactions  . Ibuprofen Hives, Shortness Of Breath, Palpitations and Rash    Has tolerated toradol 12/2013 inpatient  . Penicillins Anaphylaxis, Hives and Rash    Has patient had a PCN reaction causing immediate rash, facial/tongue/throat swelling, SOB or lightheadedness with hypotension: Yes Has patient had a PCN reaction causing severe rash involving mucus membranes or skin necrosis: Yes Has patient had a PCN reaction that required hospitalization Unknown Has patient had a PCN reaction occurring within the last 10 years: Unknown If all of the above answers are "NO", then may proceed with Cephalosporin use.   . Sulfa Antibiotics Hives, Shortness Of Breath, Rash and Cough  . Adhesive [Tape] Rash  . Doxycycline Hives, Swelling and Rash  . Sulfamethoxazole-Trimethoprim Rash  . Zithromax [Azithromycin] Hives, Swelling and Rash      Thank you for allowing pharmacy to be a part of this patient's care.  Excell Seltzer Poteet 06/14/2019 4:59 AM

## 2019-06-14 NOTE — Progress Notes (Signed)
Lower extremity venous has been completed.   Preliminary results in CV Proc.   Abram Sander 06/14/2019 11:44 AM

## 2019-06-14 NOTE — Progress Notes (Signed)
Patient seen and examined this morning, admitted earlier by Dr. Raliegh Ip, H&P reviewed and agree with the assessment and plan.  54 year old morbidly obese female with history of asthma, CKD stage III, hypertension, gout, came into the ER with increasing pain and swelling to bilateral lower extremities more so on the right side, developed redness and pain and this has been going on for the past week.  Reported pleuritic type chest pain and more short of breath than usual.  Right lower extremity cellulitis -Placed on ceftriaxone, continue, obtain Dopplers to rule out DVT  Acute on chronic hypoxic respiratory failure -BNP mildly elevated, CT angio without PE.  Normally she is on 3 L oxygen at home.  This is likely multifactorial due to probable OSA, obesity hypoventilation  Essential hypertension -Continue home medications  Chronic kidney disease stage IIIb -Baseline creatinine ranging anywhere between 1.5 and 1.9 in the past year, currently at 1.6.  Monitor closely as she had contrast with angiogram  Anemia from chronic kidney disease -Hemoglobin stable  History of gout -Continue allopurinol  OSA -Continue CPAP  Scheduled Meds: . albuterol  8 puff Inhalation Q4H  . allopurinol  100 mg Oral Daily  . cloNIDine  0.2 mg Oral BID  . darifenacin  7.5 mg Oral Daily  . DULoxetine  30 mg Oral Daily  . enoxaparin (LOVENOX) injection  40 mg Subcutaneous Q24H  . furosemide  40 mg Oral BID  . isosorbide mononitrate  30 mg Oral Daily  . lisinopril  40 mg Oral Daily  . omega-3 acid ethyl esters  1 g Oral Daily  . pantoprazole  40 mg Oral Daily  . triamcinolone cream   Topical BID  . vitamin E  100 Units Oral Daily   Continuous Infusions: . cefTRIAXone (ROCEPHIN)  IV     PRN Meds:.acetaminophen **OR** acetaminophen, acetaminophen-codeine, hydrOXYzine, oxyCODONE-acetaminophen  Brylyn Novakovich M. Cruzita Lederer, MD, PhD [facility]  Between 7 am - 7 pm I am available, please contact me via Amion or  Securechat Between 7 pm - 7 am I am not available, please contact night coverage MD/APP via Amion

## 2019-06-14 NOTE — ED Notes (Signed)
Help clean patient up and changed the bed patient is now resting with call bell in reach

## 2019-06-14 NOTE — H&P (Signed)
History and Physical    Kara Vincent:[id number] DOB: 12/01/1965 DOA: 06/13/2019  PCP: Ladell Pier, MD  Patient coming from: Home.  Chief Complaint: Lower extremity pain and swelling.  HPI: LOUELLEN HALDEMAN is a 54 y.o. female with care morbid obesity asthma chronic kidney disease stage III hypertension gout presents to the ER because of increasing pain and swelling of the both lower extremities more on the right side.  This has been ongoing for last 1 week.  Patient states she also got more short of breath than usual.  Has some pleuritic type of chest pain denies any productive cough or fever or chills.  Patient in addition also has been having some frontal headache with no focal deficits.  Headache has been present for last few hours.  ED Course: In the ER on exam patient has bilateral lower extremity erythema and swelling of both legs more on the right side.  No signs of any compartment syndrome.  The swelling extends from the ankle up to the mid calf.  Patient also was mildly hypoxic usually uses chronic oxygen at home.  Has not been using her CPAP recently because she lost it.  Chest x-ray shows nonspecific findings for possible opacity.  CT angiogram was unremarkable.  Labs show creatinine 1.8 BNP 120 lactic acid 1.8 hemoglobin 10.6 WBC 11.4 x-ray of the both left-side and right side shows edema.  EKG shows normal sinus rhythm with low voltage.  Review of Systems: As per HPI, rest all negative.   Past Medical History:  Diagnosis Date  . Allergy   . Anxiety   . Arthritis   . Asthma   . Asthma   . CHF (congestive heart failure) ([place])   . CHF (congestive heart failure) ([place])   . Chronic abdominal pain   . Chronic kidney disease   . Chronic pain    "all over"  . 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 GERD (gastroesophageal reflux disease)   . Gout   . Hidradenitis   . HLD (hyperlipidemia) 12/14/2015  . Homelessness   . Hyperlipemia   . Hypertension     . Low back pain   . Morbid obesity ([place])    uses motor wheel chair  . MRSA (methicillin resistant Staphylococcus aureus)    states about a year ago  . On home O2    3L N/C O2 continuously  . OSA (obstructive sleep apnea)    non-compliant with CPAP  . Oxygen deficiency   . Renal insufficiency   . Sleep apnea     Past Surgical History:  Procedure Laterality Date  . BACK SURGERY    . CESAREAN SECTION    . CESAREAN SECTION    . FRACTURE SURGERY    . TUBAL LIGATION       reports that she quit smoking about 16 years ago. Her smoking use included cigarettes. She quit smokeless tobacco use about 41 years ago. She reports current alcohol use. She reports current drug use. Drugs: "Crack" cocaine and Other-see comments.  Allergies  Allergen Reactions  . Ibuprofen Hives, Shortness Of Breath, Palpitations and Rash    Has tolerated toradol 12/2013 inpatient  . Penicillins Anaphylaxis, Hives and Rash    Has patient had a PCN reaction causing immediate rash, facial/tongue/throat swelling, SOB or lightheadedness with hypotension: Yes Has patient had a PCN reaction causing severe rash involving mucus membranes or skin necrosis: Yes Has patient had a PCN reaction that  required hospitalization Unknown Has patient had a PCN reaction occurring within the last 10 years: Unknown If all of the above answers are "NO", then may proceed with Cephalosporin use.   . Sulfa Antibiotics Hives, Shortness Of Breath, Rash and Cough  . Adhesive [Tape] Rash  . Doxycycline Hives, Swelling and Rash  . Sulfamethoxazole-Trimethoprim Rash  . Zithromax [Azithromycin] Hives, Swelling and Rash    Family History  Problem Relation Age of Onset  . Asthma Mother   . Diabetes Father   . Stroke Father   . Heart disease Father   . Arthritis Sister   . Asthma Sister   . Asthma Daughter   . Asthma Son   . Arthritis Sister   . Asthma Sister     Prior to Admission medications   Medication Sig Start Date End Date  Taking? Authorizing Provider  acetaminophen-codeine (TYLENOL #3) 300-30 MG tablet Take 1 tablet by mouth every 8 (eight) hours as needed for moderate pain. Each prescription to last one month 05/25/19  Yes Ladell Pier, MD  albuterol (VENTOLIN HFA) 108 (90 Base) MCG/ACT inhaler Inhale 1-2 puffs into the lungs every 6 (six) hours as needed for wheezing or shortness of breath. 11/22/18 06/13/28 Yes Ladell Pier, MD  allopurinol (ZYLOPRIM) 100 MG tablet Take 1 tablet (100 mg total) by mouth every morning. Patient taking differently: Take 100 mg by mouth daily.  11/22/18  Yes Ladell Pier, MD  cholecalciferol (VITAMIN D3) 25 MCG (1000 UNIT) tablet Take 1,000 Units by mouth daily.   Yes [provider]  cloNIDine (CATAPRES) 0.2 MG tablet Take 1 tablet (0.2 mg total) by mouth 2 (two) times daily. 11/22/18  Yes Ladell Pier, MD  Collagen-Boron-Hyaluronic Acid ([employer] JOINT HEALTH TRIPLE ACTION PO) Take 1 tablet by mouth daily.   Yes [provider]  DULoxetine (CYMBALTA) 30 MG capsule Take 1 capsule (30 mg total) by mouth daily. 04/15/19  Yes Ladell Pier, MD  hydrOXYzine (ATARAX/VISTARIL) 50 MG tablet TAKE ONE TABLET BY MOUTH THREE TIMES DAILY AS NEEDED FOR ANXIETY OR FOR ITCHING Patient taking differently: Take 50 mg by mouth every 8 (eight) hours as needed for anxiety.  06/07/19  Yes Ladell Pier, MD  isosorbide mononitrate (IMDUR) 30 MG 24 hr tablet Take 1 tablet (30 mg total) by mouth daily. 04/15/19  Yes Ladell Pier, MD  lisinopril (ZESTRIL) 40 MG tablet TAKE ONE TABLET BY MOUTH EVERY DAY Patient taking differently: Take 40 mg by mouth daily.  04/15/19  Yes Ladell Pier, MD  nystatin (MYCOSTATIN/NYSTOP) powder Apply topically 4 (four) times daily. Apply beneath breast and below abdominal fold 04/15/19  Yes Ladell Pier, MD  Omega-3 Fatty Acids (FISH OIL PO) Take 1 capsule by mouth daily.   Yes [provider]  pantoprazole (PROTONIX)  40 MG tablet Take 1 tablet (40 mg total) by mouth daily. 11/22/18  Yes Ladell Pier, MD  triamcinolone cream (KENALOG) 0.1 % APPLY topically TO THE RASH ON your back TWICE DAILY Patient taking differently: Apply 1 application topically 2 (two) times daily. To rash on back 05/17/19  Yes Ladell Pier, MD  VESICARE 5 MG tablet TAKE ONE TABLET BY MOUTH EVERY MORNING Patient taking differently: Take 5 mg by mouth daily.  05/19/19  Yes Ladell Pier, MD  VITAMIN E PO Take 1 tablet by mouth daily.   Yes [provider]  atorvastatin (LIPITOR) 20 MG tablet Take 1 tablet (20 mg total) by mouth  every morning. Patient not taking: Reported on 06/14/2019 11/22/18   Ladell Pier, MD  bacitracin ointment Apply 1 application topically 2 (two) times daily. Patient not taking: Reported on 06/14/2019 03/12/19   Horton, Barbette Hair, MD  cephALEXin (KEFLEX) 500 MG capsule Take 1 capsule (500 mg total) by mouth 4 (four) times daily. Patient not taking: Reported on 06/14/2019 03/12/19   Horton, Barbette Hair, MD  diclofenac sodium (VOLTAREN) 1 % GEL Apply 4 g topically 4 (four) times daily. Patient not taking: Reported on 06/14/2019 11/22/18   Ladell Pier, MD  furosemide (LASIX) 40 MG tablet Take 1 tablet (40 mg total) by mouth daily. 1 tab po daily Patient not taking: Reported on 06/14/2019 04/15/19   Ladell Pier, MD  ipratropium-albuterol (DUONEB) 0.5-2.5 (3) MG/3ML SOLN Take 3 mLs by nebulization every 6 (six) hours as needed (SOB). 07/16/17 03/12/19  Arrien, Jimmy Picket, MD  ketoconazole (NIZORAL) 2 % shampoo Apply 1 application topically 2 (two) times a week. Apply to hair for treatment of dandruff Patient not taking: Reported on 06/14/2019 04/18/19   Ladell Pier, MD  Misc. Devices MISC Please provide patient with a raised toilet seat that is covered under her current insurance plan. 03/29/18   Gildardo Pounds, NP  Misc. Devices MISC Heavy duty shower chair; Heavy duty potty  chair. Dx: Osteoarthritis 10/13/18   Charlott Rakes, MD  mupirocin cream (BACTROBAN) 2 % Apply to affected area twice a day Patient not taking: Reported on 06/14/2019 11/22/18   Ladell Pier, MD  nystatin cream (MYCOSTATIN) APPLY TOPICALLY TO THE AFFECTED AREA(S) TWICE DAILY Patient not taking: Reported on 06/14/2019 12/28/17   Ladell Pier, MD  diphenhydrAMINE (SOMINEX) 25 MG tablet Take 25 mg by mouth at bedtime as needed.  06/14/11  [provider]    Physical Exam: Constitutional: Moderately built and nourished. Vitals:   06/14/19 0250 06/14/19 0300 06/14/19 0415 06/14/19 0430  BP: (!) 124/109 134/67 (!) 129/101 (!) 134/117  Pulse: 83 79 85 87  Resp:      Temp:      TempSrc:      SpO2: 100% 100% 99% 99%   Eyes: Anicteric no pallor. ENMT: No discharge from the ears eyes nose or mouth. Neck: No mass or.  No neck rigidity. Respiratory: No rhonchi or crepitations. Cardiovascular: S1-S2 heard. Abdomen: Soft nontender bowel sound present. Musculoskeletal: Bilateral lower extremity edema present. Skin: Bilateral lower extremity erythema of the both legs extending from the ankle to mid calf. Neurologic: Alert awake oriented time place and person.  Moves all extremities. Psychiatric: Appears normal per normal affect.   Labs on Admission: I have personally reviewed following labs and imaging studies  CBC: Recent Labs  Lab 06/13/19 1901  WBC 11.4*  HGB 10.6*  HCT 36.0  MCV 99.7  PLT 621   Basic Metabolic Panel: Recent Labs  Lab 06/13/19 1901  NA 140  K 5.0  CL 103  CO2 28  GLUCOSE 134*  BUN 36*  CREATININE 1.85*  CALCIUM 8.4*   GFR: CrCl cannot be calculated (Unknown ideal weight.). Liver Function Tests: Recent Labs  Lab 06/13/19 1901  AST 18  ALT 14  ALKPHOS 66  BILITOT 0.5  PROT 7.1  ALBUMIN 3.1*   No results for input(s): LIPASE, AMYLASE in the last 168 hours. No results for input(s): AMMONIA in the last 168 hours. Coagulation  Profile: No results for input(s): INR, PROTIME in the last 168 hours. Cardiac Enzymes: No results for  input(s): CKTOTAL, CKMB, CKMBINDEX, TROPONINI in the last 168 hours. BNP (last 3 results) No results for input(s): PROBNP in the last 8760 hours. HbA1C: No results for input(s): HGBA1C in the last 72 hours. CBG: No results for input(s): GLUCAP in the last 168 hours. Lipid Profile: No results for input(s): CHOL, HDL, LDLCALC, TRIG, CHOLHDL, LDLDIRECT in the last 72 hours. Thyroid Function Tests: No results for input(s): TSH, T4TOTAL, FREET4, T3FREE, THYROIDAB in the last 72 hours. Anemia Panel: No results for input(s): VITAMINB12, FOLATE, FERRITIN, TIBC, IRON, RETICCTPCT in the last 72 hours. Urine analysis:    Component Value Date/Time   COLORURINE YELLOW 04/06/2018 2101   APPEARANCEUR CLEAR 04/06/2018 2101   LABSPEC 1.023 04/06/2018 2101   PHURINE 5.0 04/06/2018 2101   GLUCOSEU NEGATIVE 04/06/2018 2101   GLUCOSEU NEG mg/dL 10/01/2007 2007   HGBUR NEGATIVE 04/06/2018 2101   HGBUR negative 12/05/2009 1003   BILIRUBINUR NEGATIVE 04/06/2018 2101   BILIRUBINUR moderate (A) 03/29/2018 1701   BILIRUBINUR small 07/31/2010 [place] 04/06/2018 2101   PROTEINUR NEGATIVE 04/06/2018 2101   UROBILINOGEN 1.0 03/29/2018 1701   UROBILINOGEN 0.2 12/01/2014 2240   NITRITE POSITIVE (A) 04/06/2018 2101   LEUKOCYTESUR TRACE (A) 04/06/2018 2101   Sepsis Labs: @LABRCNTIP (procalcitonin:4,lacticidven:4) )No results found for this or any previous visit (from the past 240 hour(s)).   Radiological Exams on Admission: DG Chest 2 View  Result Date: 06/13/2019 CLINICAL DATA:  Shortness of breath. Patient reports bilateral lower leg pain, redness and swelling. EXAM: CHEST - 2 VIEW COMPARISON:  CT angiogram chest 03/12/2019, chest radiograph 03/12/2019 FINDINGS: Limited evaluation due to patient body habitus and shallow inspiration. Mild cardiomegaly appears unchanged. Mild ill-defined  opacity within the lateral right lung base. No evidence of pleural effusion or pneumothorax. No acute bony abnormality. IMPRESSION: Limited examination as described. Shallow inspiration radiograph. Mild ill-defined opacity within the lateral right lung base which may reflect atelectasis. Pneumonia cannot be excluded. Unchanged mild cardiomegaly. Electronically Signed   By: Kellie Simmering DO   On: 06/13/2019 19:53   DG Tibia/Fibula Left  Result Date: 06/13/2019 CLINICAL DATA:  Bilateral lower leg pain. Redness and swelling. EXAM: LEFT TIBIA AND FIBULA - 2 VIEW COMPARISON:  None. FINDINGS: Cortical margins of the tibia and fibula are intact. No fracture. No bony destruction or periosteal reaction. Moderate osteoarthritis of the knee joint. Mild osteoarthritis of the ankle. Generalized soft tissue edema and soft tissue prominence. No soft tissue air or radiopaque foreign body. Multiple phleboliths in the soft tissues. IMPRESSION: Generalized soft tissue edema.  No acute osseous abnormality. Electronically Signed   By: Keith Rake M.D.   On: 06/13/2019 19:51   DG Tibia/Fibula Right  Result Date: 06/13/2019 CLINICAL DATA:  Bilateral lower leg pain. Redness and swelling. EXAM: RIGHT TIBIA AND FIBULA - 2 VIEW COMPARISON:  None. FINDINGS: Cortical margins of the tibia and fibula are intact. No fracture. No bony destruction or periosteal reaction. Moderate osteoarthritis of the knee joint. Mild osteoarthritis of the ankle. Generalized soft tissue edema and soft tissue prominence. No soft tissue air or radiopaque foreign body. Multiple phleboliths in the soft tissues. IMPRESSION: Generalized soft tissue edema. No acute osseous abnormality. Electronically Signed   By: Keith Rake M.D.   On: 06/13/2019 19:50   CT Angio Chest PE W and/or Wo Contrast  Result Date: 06/14/2019 CLINICAL DATA:  Right leg pain and redness with swelling for 1 week EXAM: CT ANGIOGRAPHY CHEST WITH CONTRAST TECHNIQUE: Multidetector CT  imaging of the  chest was performed using the standard protocol during bolus administration of intravenous contrast. Multiplanar CT image reconstructions and MIPs were obtained to evaluate the vascular anatomy. CONTRAST:  54mL OMNIPAQUE IOHEXOL 350 MG/ML SOLN COMPARISON:  03/12/2019 FINDINGS: Cardiovascular: Limited opacification of the pulmonary arteries due to body habitus and bolus dispersion. No pulmonary embolism is seen, definitely nondiagnostic beyond the main and lobar level. Coronary calcification. No cardiomegaly or pericardial effusion. Mild atherosclerotic calcification. Mediastinum/Nodes: Negative for adenopathy or mass. Lungs/Pleura: Linear atelectasis/scarring. There is no edema, consolidation, effusion, or pneumothorax. Symmetric narrowing of the bilateral mainstem bronchi, likely bronchomalacia. Upper Abdomen: No acute finding Musculoskeletal: Spondylosis.  No acute or aggressive finding Review of the MIP images confirms the above findings. IMPRESSION: 1. Severely limited chest CTA due to body habitus and bolus dispersion. No pulmonary embolism is seen at the main or lobar levels. 2.  Aortic Atherosclerosis (ICD10-I70.0).  Coronary atherosclerosis Electronically Signed   By: Monte Fantasia M.D.   On: 06/14/2019 04:08    EKG: Independently reviewed.  Normal sinus rhythm low voltage.  Assessment/Plan Principal Problem:   Cellulitis of both lower extremities Active Problems:   Asthma exacerbation   CKD (chronic kidney disease) stage 3, GFR 30-59 ml/min (HCC)   OSA (obstructive sleep apnea)   Essential hypertension   Gout   Morbid obesity ([place])   Acute respiratory failure with hypoxia (HCC)   Cellulitis    1. Cellulitis of the both lower extremities for which patient is on empiric antibiotics.  Will check Dopplers to rule out DVT.  No signs of any compartment syndrome. 2. Acute respiratory failure hypoxia could be from asthma versus possible fluid.  Patient states he takes Lasix 40  mg twice daily.  For now we will continue the same regimen along with the albuterol HFA which can be changed to nebulizer if Covid test is negative.  Closely monitor. 3. Hypertension on clonidine Imdur and lisinopril. 4. Chronic kidney disease stage III creatinine is appears to be at baseline.  Closely monitor that patient is on Lasix and lisinopril. 5. Anemia likely from renal disease appears to be chronic.  Follow CBC. 6. History of gout on allopurinol. 7. History of sleep apnea has not been compliant due to patient recently lost her CPAP.  CPAP at bedtime. 8. SM opacity on the left side of the lung field on chest x-ray.  No signs of any definite pneumonia.   Since patient has bilateral lower extremity cellulitis and also hypoxia will need close monitoring for any deterioration in inpatient status.  Covid test is pending.   DVT prophylaxis: Lovenox. Code Status: Full code. Family Communication: Discussed with patient. Disposition Plan: Home. Consults called: None. Admission status: Inpatient.   [name] MD [facility] Pager [phone].  If 7PM-7AM, please contact night-coverage [email or web address] Password [facility]  06/14/2019, 5:57 AM

## 2019-06-14 NOTE — ED Provider Notes (Signed)
[facility] EMERGENCY DEPARTMENT Provider Note   CSN: [id number] Arrival date & time: 06/13/19  1818     History Chief Complaint  Patient presents with  . Leg Swelling    Kara Vincent is a 54 y.o. female with history of chronic Darier disease, CHF, CKD, asthma, chronic respiratory failure on 3L home O2, who presents to the emergency department with a chief complaint of redness and the swelling to her right lower leg for the last week.  She also reports redness and swelling in her left lower leg.  She is also developed a rash to her lower abdomen over the last week.  No fevers or chills.  She reports that she wears 3 L of home oxygen at home, but did not arrive in the emergency department today with her home oxygen she reports that she has been feeling more short of breath and productive cough with yellow sputum for the last few days. She also reports worsening pain in her upper back.  Reports that she has been using her home albuterol, but has not used it today because she has been in the emergency department.  No history of VTE.  She has also been having a headache, but states this is chronic.  She denies dizziness lightheadedness, chest pain, nausea, vomiting, diarrhea, dysuria, numbness, or weakness.  She takes Tylenol 3 at home for pain control. No known or suspected COVID-19 contacts. She is a former smoker  The history is provided by the patient. No language interpreter was used.       Past Medical History:  Diagnosis Date  . Allergy   . Anxiety   . Arthritis   . Asthma   . Asthma   . CHF (congestive heart failure) ([place])   . CHF (congestive heart failure) ([place])   . Chronic abdominal pain   . Chronic kidney disease   . Chronic pain    "all over"  . 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 GERD (gastroesophageal reflux disease)   . Gout   . Hidradenitis   . HLD (hyperlipidemia) 12/14/2015  . Homelessness   . Hyperlipemia   .  Hypertension   . Low back pain   . Morbid obesity ([place])    uses motor wheel chair  . MRSA (methicillin resistant Staphylococcus aureus)    states about a year ago  . On home O2    3L N/C O2 continuously  . OSA (obstructive sleep apnea)    non-compliant with CPAP  . Oxygen deficiency   . Renal insufficiency   . Sleep apnea     Patient Active Problem List   Diagnosis Date Noted  . Acute respiratory failure with hypoxia ([place]) 06/14/2019  . Cellulitis of both lower extremities 06/14/2019  . Cellulitis 06/14/2019  . Morbid obesity ([place]) 12/01/2017  . Primary osteoarthritis of both knees 12/01/2017  . Anxiety 12/01/2017  . Impaired mobility 12/01/2017  . Essential hypertension 12/14/2015  . HLD (hyperlipidemia) 12/14/2015  . GERD (gastroesophageal reflux disease) 12/14/2015  . Gout 12/14/2015  . Darier's disease   . Leukocytosis   . 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 Right-sided heart failure ([place]) 11/20/2014  . OSA (obstructive sleep apnea)   . CKD (chronic kidney disease) stage 3, GFR 30-59 ml/min ([facility]) 11/17/2014  . Asthma exacerbation 11/16/2014  . Venous stasis 11/16/2014  . Acute on chronic respiratory failure with hypoxia and hypercapnia ([place]) 11/16/2014  . Chronic pain 08/24/2011  . Obesity hypoventilation  syndrome ([place]) 08/24/2011    Past Surgical History:  Procedure Laterality Date  . BACK SURGERY    . CESAREAN SECTION    . CESAREAN SECTION    . FRACTURE SURGERY    . TUBAL LIGATION       OB History    Gravida  0   Para  0   Term  0   Preterm  0   AB  0   Living        SAB  0   TAB  0   Ectopic  0   Multiple      Live Births              Family History  Problem Relation Age of Onset  . Asthma Mother   . Diabetes Father   . Stroke Father   . Heart disease Father   . Arthritis Sister   . Asthma Sister   . Asthma Daughter   . Asthma Son   . Arthritis Sister   . Asthma Sister     Social History   Tobacco Use  . Smoking status:  Former Smoker    Types: Cigarettes    Quit date: 03/25/2003    Years since quitting: 16.2  . Smokeless tobacco: Former Systems developer    Quit date: 05/27/1978  Substance Use Topics  . Alcohol use: Yes    Alcohol/week: 0.0 standard drinks    Comment: years ago  no longer beer only  early 20's  . Drug use: Yes    Types: "Crack" cocaine, Other-see comments    Comment: years  ago    Home Medications Prior to Admission medications   Medication Sig Start Date End Date Taking? Authorizing Provider  acetaminophen-codeine (TYLENOL #3) 300-30 MG tablet Take 1 tablet by mouth every 8 (eight) hours as needed for moderate pain. Each prescription to last one month 05/25/19  Yes Ladell Pier, MD  albuterol (VENTOLIN HFA) 108 (90 Base) MCG/ACT inhaler Inhale 1-2 puffs into the lungs every 6 (six) hours as needed for wheezing or shortness of breath. 11/22/18 06/13/28 Yes Ladell Pier, MD  allopurinol (ZYLOPRIM) 100 MG tablet Take 1 tablet (100 mg total) by mouth every morning. Patient taking differently: Take 100 mg by mouth daily.  11/22/18  Yes Ladell Pier, MD  cholecalciferol (VITAMIN D3) 25 MCG (1000 UNIT) tablet Take 1,000 Units by mouth daily.   Yes [provider]  cloNIDine (CATAPRES) 0.2 MG tablet Take 1 tablet (0.2 mg total) by mouth 2 (two) times daily. 11/22/18  Yes Ladell Pier, MD  Collagen-Boron-Hyaluronic Acid ([employer] JOINT HEALTH TRIPLE ACTION PO) Take 1 tablet by mouth daily.   Yes [provider]  DULoxetine (CYMBALTA) 30 MG capsule Take 1 capsule (30 mg total) by mouth daily. 04/15/19  Yes Ladell Pier, MD  hydrOXYzine (ATARAX/VISTARIL) 50 MG tablet TAKE ONE TABLET BY MOUTH THREE TIMES DAILY AS NEEDED FOR ANXIETY OR FOR ITCHING Patient taking differently: Take 50 mg by mouth every 8 (eight) hours as needed for anxiety.  06/07/19  Yes Ladell Pier, MD  isosorbide mononitrate (IMDUR) 30 MG 24 hr tablet Take 1 tablet (30 mg total) by mouth daily. 04/15/19  Yes  Ladell Pier, MD  lisinopril (ZESTRIL) 40 MG tablet TAKE ONE TABLET BY MOUTH EVERY DAY Patient taking differently: Take 40 mg by mouth daily.  04/15/19  Yes Ladell Pier, MD  nystatin (MYCOSTATIN/NYSTOP) powder Apply topically 4 (four) times daily. Apply beneath breast  and below abdominal fold 04/15/19  Yes Ladell Pier, MD  Omega-3 Fatty Acids (FISH OIL PO) Take 1 capsule by mouth daily.   Yes [provider]  pantoprazole (PROTONIX) 40 MG tablet Take 1 tablet (40 mg total) by mouth daily. 11/22/18  Yes Ladell Pier, MD  triamcinolone cream (KENALOG) 0.1 % APPLY topically TO THE RASH ON your back TWICE DAILY Patient taking differently: Apply 1 application topically 2 (two) times daily. To rash on back 05/17/19  Yes Ladell Pier, MD  VESICARE 5 MG tablet TAKE ONE TABLET BY MOUTH EVERY MORNING Patient taking differently: Take 5 mg by mouth daily.  05/19/19  Yes Ladell Pier, MD  VITAMIN E PO Take 1 tablet by mouth daily.   Yes [provider]  atorvastatin (LIPITOR) 20 MG tablet Take 1 tablet (20 mg total) by mouth every morning. Patient not taking: Reported on 06/14/2019 11/22/18   Ladell Pier, MD  bacitracin ointment Apply 1 application topically 2 (two) times daily. Patient not taking: Reported on 06/14/2019 03/12/19   Horton, Barbette Hair, MD  cephALEXin (KEFLEX) 500 MG capsule Take 1 capsule (500 mg total) by mouth 4 (four) times daily. Patient not taking: Reported on 06/14/2019 03/12/19   Horton, Barbette Hair, MD  diclofenac sodium (VOLTAREN) 1 % GEL Apply 4 g topically 4 (four) times daily. Patient not taking: Reported on 06/14/2019 11/22/18   Ladell Pier, MD  furosemide (LASIX) 40 MG tablet Take 1 tablet (40 mg total) by mouth daily. 1 tab po daily Patient not taking: Reported on 06/14/2019 04/15/19   Ladell Pier, MD  ipratropium-albuterol (DUONEB) 0.5-2.5 (3) MG/3ML SOLN Take 3 mLs by nebulization every 6 (six) hours as needed  (SOB). 07/16/17 03/12/19  Arrien, Jimmy Picket, MD  ketoconazole (NIZORAL) 2 % shampoo Apply 1 application topically 2 (two) times a week. Apply to hair for treatment of dandruff Patient not taking: Reported on 06/14/2019 04/18/19   Ladell Pier, MD  Misc. Devices MISC Please provide patient with a raised toilet seat that is covered under her current insurance plan. 03/29/18   Gildardo Pounds, NP  Misc. Devices MISC Heavy duty shower chair; Heavy duty potty chair. Dx: Osteoarthritis 10/13/18   Charlott Rakes, MD  mupirocin cream (BACTROBAN) 2 % Apply to affected area twice a day Patient not taking: Reported on 06/14/2019 11/22/18   Ladell Pier, MD  nystatin cream (MYCOSTATIN) APPLY TOPICALLY TO THE AFFECTED AREA(S) TWICE DAILY Patient not taking: Reported on 06/14/2019 12/28/17   Ladell Pier, MD  diphenhydrAMINE (SOMINEX) 25 MG tablet Take 25 mg by mouth at bedtime as needed.  06/14/11  [provider]    Allergies    Ibuprofen, Penicillins, Sulfa antibiotics, Adhesive [tape], Doxycycline, Sulfamethoxazole-trimethoprim, and Zithromax [azithromycin]  Review of Systems   Review of Systems  Constitutional: Negative for activity change, chills and fever.  HENT: Negative for congestion and sneezing.   Respiratory: Positive for cough, shortness of breath and wheezing.   Cardiovascular: Positive for leg swelling. Negative for chest pain and palpitations.  Gastrointestinal: Positive for abdominal pain. Negative for diarrhea, nausea and vomiting.  Genitourinary: Negative for dysuria, frequency, hematuria and urgency.  Musculoskeletal: Positive for myalgias. Negative for arthralgias and back pain.  Skin: Positive for rash. Negative for color change.  Allergic/Immunologic: Negative for immunocompromised state.  Neurological: Positive for headaches. Negative for dizziness, syncope, weakness, light-headedness and numbness.  Psychiatric/Behavioral: Negative for confusion.     Physical Exam Updated Vital  Signs BP (!) 121/103   Pulse 82   Temp 98.3 F (36.8 C) (Oral)   Resp 19   LMP 03/24/2009   SpO2 97%   Physical Exam Vitals and nursing note reviewed.  Constitutional:      General: She is not in acute distress.    Comments: Morbidly obese.  Chronically ill-appearing.  HENT:     Head: Normocephalic.  Eyes:     Conjunctiva/sclera: Conjunctivae normal.  Cardiovascular:     Rate and Rhythm: Normal rate and regular rhythm.     Pulses: Normal pulses.     Heart sounds: Normal heart sounds. No murmur. No friction rub. No gallop.   Pulmonary:     Effort: Pulmonary effort is normal. No respiratory distress.     Breath sounds: No stridor. Wheezing present. No rhonchi or rales.     Comments: Wheezing and rhonchi bilaterally in all fields Chest:     Chest wall: No tenderness.  Abdominal:     General: There is no distension.     Palpations: Abdomen is soft.     Comments: Erythematous rash noted to the bilateral lower abdomen.  No tenderness palpation to the abdomen.  Abdomen is obese, but soft and nondistended.  Musculoskeletal:     Cervical back: Neck supple.     Right lower leg: Edema present.     Left lower leg: Edema present.     Comments: Erythematous warm area noted to the bilateral lower legs.  The right lower leg is significantly more swollen in the foot extending up to the knee.  Swelling and erythema is also noted to the left lower extremity.  No calf tenderness.  Muscular compartments are soft.    Skin:    General: Skin is warm.     Capillary Refill: Capillary refill takes less than 2 seconds.     Coloration: Skin is not jaundiced or pale.     Findings: No rash.     Comments: There is a maculopapular red rash with satellite lesions noted to the right upper flank and mid back.  There are also several ulcerations noted to the patient's trunk and extremities.  No abscesses.    Neurological:     General: No focal deficit present.     Mental  Status: She is alert.  Psychiatric:        Behavior: Behavior normal.     ED Results / Procedures / Treatments   Labs (all labs ordered are listed, but only abnormal results are displayed) Labs Reviewed  CBC - Abnormal; Notable for the following components:      Result Value   WBC 11.4 (*)    RBC 3.61 (*)    Hemoglobin 10.6 (*)    MCHC 29.4 (*)    All other components within normal limits  COMPREHENSIVE METABOLIC PANEL - Abnormal; Notable for the following components:   Glucose, Bld 134 (*)    BUN 36 (*)    Creatinine, Ser 1.85 (*)    Calcium 8.4 (*)    Albumin 3.1 (*)    GFR calc non Af Amer 30 (*)    GFR calc Af Amer 35 (*)    All other components within normal limits  BRAIN NATRIURETIC PEPTIDE - Abnormal; Notable for the following components:   B Natriuretic Peptide 120.9 (*)    All other components within normal limits  CBC - Abnormal; Notable for the following components:   WBC 12.4 (*)    RBC 3.33 (*)  Hemoglobin 9.6 (*)    HCT 33.7 (*)    MCV 101.2 (*)    MCHC 28.5 (*)    All other components within normal limits  CREATININE, SERUM - Abnormal; Notable for the following components:   Creatinine, Ser 1.68 (*)    GFR calc non Af Amer 34 (*)    GFR calc Af Amer 39 (*)    All other components within normal limits  SARS CORONAVIRUS 2 (TAT 6-24 HRS)  LACTIC ACID, PLASMA  LACTIC ACID, PLASMA  HIV ANTIBODY (ROUTINE TESTING W REFLEX)  TROPONIN I (HIGH SENSITIVITY)  TROPONIN I (HIGH SENSITIVITY)    EKG EKG Interpretation  Date/Time:  Monday June 13 2019 18:53:55 EDT Ventricular Rate:  76 PR Interval:  158 QRS Duration: 92 QT Interval:  366 QTC Calculation: 411 R Axis:   61 Text Interpretation: Normal sinus rhythm with sinus arrhythmia Low voltage QRS Borderline ECG No significant change was found Confirmed by Ezequiel Essex [phone]) on 06/14/2019 12:20:11 AM   Radiology DG Chest 2 View  Result Date: 06/13/2019 CLINICAL DATA:  Shortness of breath.  Patient reports bilateral lower leg pain, redness and swelling. EXAM: CHEST - 2 VIEW COMPARISON:  CT angiogram chest 03/12/2019, chest radiograph 03/12/2019 FINDINGS: Limited evaluation due to patient body habitus and shallow inspiration. Mild cardiomegaly appears unchanged. Mild ill-defined opacity within the lateral right lung base. No evidence of pleural effusion or pneumothorax. No acute bony abnormality. IMPRESSION: Limited examination as described. Shallow inspiration radiograph. Mild ill-defined opacity within the lateral right lung base which may reflect atelectasis. Pneumonia cannot be excluded. Unchanged mild cardiomegaly. Electronically Signed   By: Kellie Simmering DO   On: 06/13/2019 19:53   DG Tibia/Fibula Left  Result Date: 06/13/2019 CLINICAL DATA:  Bilateral lower leg pain. Redness and swelling. EXAM: LEFT TIBIA AND FIBULA - 2 VIEW COMPARISON:  None. FINDINGS: Cortical margins of the tibia and fibula are intact. No fracture. No bony destruction or periosteal reaction. Moderate osteoarthritis of the knee joint. Mild osteoarthritis of the ankle. Generalized soft tissue edema and soft tissue prominence. No soft tissue air or radiopaque foreign body. Multiple phleboliths in the soft tissues. IMPRESSION: Generalized soft tissue edema.  No acute osseous abnormality. Electronically Signed   By: Keith Rake M.D.   On: 06/13/2019 19:51   DG Tibia/Fibula Right  Result Date: 06/13/2019 CLINICAL DATA:  Bilateral lower leg pain. Redness and swelling. EXAM: RIGHT TIBIA AND FIBULA - 2 VIEW COMPARISON:  None. FINDINGS: Cortical margins of the tibia and fibula are intact. No fracture. No bony destruction or periosteal reaction. Moderate osteoarthritis of the knee joint. Mild osteoarthritis of the ankle. Generalized soft tissue edema and soft tissue prominence. No soft tissue air or radiopaque foreign body. Multiple phleboliths in the soft tissues. IMPRESSION: Generalized soft tissue edema. No acute osseous  abnormality. Electronically Signed   By: Keith Rake M.D.   On: 06/13/2019 19:50   CT Angio Chest PE W and/or Wo Contrast  Result Date: 06/14/2019 CLINICAL DATA:  Right leg pain and redness with swelling for 1 week EXAM: CT ANGIOGRAPHY CHEST WITH CONTRAST TECHNIQUE: Multidetector CT imaging of the chest was performed using the standard protocol during bolus administration of intravenous contrast. Multiplanar CT image reconstructions and MIPs were obtained to evaluate the vascular anatomy. CONTRAST:  82mL OMNIPAQUE IOHEXOL 350 MG/ML SOLN COMPARISON:  03/12/2019 FINDINGS: Cardiovascular: Limited opacification of the pulmonary arteries due to body habitus and bolus dispersion. No pulmonary embolism is seen, definitely nondiagnostic beyond the main  and lobar level. Coronary calcification. No cardiomegaly or pericardial effusion. Mild atherosclerotic calcification. Mediastinum/Nodes: Negative for adenopathy or mass. Lungs/Pleura: Linear atelectasis/scarring. There is no edema, consolidation, effusion, or pneumothorax. Symmetric narrowing of the bilateral mainstem bronchi, likely bronchomalacia. Upper Abdomen: No acute finding Musculoskeletal: Spondylosis.  No acute or aggressive finding Review of the MIP images confirms the above findings. IMPRESSION: 1. Severely limited chest CTA due to body habitus and bolus dispersion. No pulmonary embolism is seen at the main or lobar levels. 2.  Aortic Atherosclerosis (ICD10-I70.0).  Coronary atherosclerosis Electronically Signed   By: Monte Fantasia M.D.   On: 06/14/2019 04:08    Procedures .Critical Care Performed by: Joanne Gavel, PA-C Authorized by: Joanne Gavel, PA-C   Critical care provider statement:    Critical care time (minutes):  35   Critical care time was exclusive of:  Separately billable procedures and treating other patients   Critical care was necessary to treat or prevent imminent or life-threatening deterioration of the following  conditions:  Sepsis   Critical care was time spent personally by me on the following activities:  Ordering and review of radiographic studies, ordering and review of laboratory studies, ordering and performing treatments and interventions, evaluation of patient's response to treatment, development of treatment plan with patient or surrogate, obtaining history from patient or surrogate, examination of patient, review of old charts and re-evaluation of patient's condition   (including critical care time)  Medications Ordered in ED Medications  acetaminophen-codeine (TYLENOL #3) 300-30 MG per tablet 1 tablet (has no administration in time range)  allopurinol (ZYLOPRIM) tablet 100 mg (has no administration in time range)  cloNIDine (CATAPRES) tablet 0.2 mg (has no administration in time range)  isosorbide mononitrate (IMDUR) 24 hr tablet 30 mg (has no administration in time range)  lisinopril (ZESTRIL) tablet 40 mg (has no administration in time range)  DULoxetine (CYMBALTA) DR capsule 30 mg (has no administration in time range)  hydrOXYzine (ATARAX/VISTARIL) tablet 50 mg (has no administration in time range)  pantoprazole (PROTONIX) EC tablet 40 mg (has no administration in time range)  darifenacin (ENABLEX) 24 hr tablet 7.5 mg (has no administration in time range)  vitamin E capsule 100 Units (has no administration in time range)  omega-3 acid ethyl esters (LOVAZA) capsule 1 g (has no administration in time range)  albuterol (VENTOLIN HFA) 108 (90 Base) MCG/ACT inhaler 8 puff (has no administration in time range)  acetaminophen (TYLENOL) tablet 650 mg (has no administration in time range)    Or  acetaminophen (TYLENOL) suppository 650 mg (has no administration in time range)  enoxaparin (LOVENOX) injection 40 mg (has no administration in time range)  furosemide (LASIX) tablet 40 mg (has no administration in time range)  oxyCODONE-acetaminophen (PERCOCET/ROXICET) 5-325 MG per tablet 2 tablet  (has no administration in time range)  triamcinolone cream (KENALOG) 0.1 % (has no administration in time range)  cefTRIAXone (ROCEPHIN) 1 g in sodium chloride 0.9 % 100 mL IVPB (has no administration in time range)  acetaminophen (TYLENOL) tablet 650 mg (650 mg Oral Given 06/14/19 0138)  albuterol (VENTOLIN HFA) 108 (90 Base) MCG/ACT inhaler 8 puff (8 puffs Inhalation Given 06/14/19 0138)  ipratropium (ATROVENT HFA) inhaler 2 puff (2 puffs Inhalation Given 06/14/19 0138)  AeroChamber Plus Flo-Vu Large MISC 1 each (1 each Other Given 06/14/19 0145)  acetaminophen-codeine (TYLENOL #3) 300-30 MG per tablet 1 tablet (1 tablet Oral Given 06/14/19 0211)  iohexol (OMNIPAQUE) 350 MG/ML injection 75 mL (75 mLs Intravenous Contrast  Given 06/14/19 0357)  cefTRIAXone (ROCEPHIN) 1 g in sodium chloride 0.9 % 100 mL IVPB (0 g Intravenous Stopped 06/14/19 0450)  vancomycin (VANCOREADY) IVPB 2000 mg/400 mL (0 mg Intravenous Stopped 06/14/19 0700)  nystatin (MYCOSTATIN/NYSTOP) topical powder ( Topical Given 06/14/19 0526)  oxyCODONE-acetaminophen (PERCOCET/ROXICET) 5-325 MG per tablet 1 tablet (1 tablet Oral Given 06/14/19 0540)    ED Course  I have reviewed the triage vital signs and the nursing notes.  Pertinent labs & imaging results that were available during my care of the patient were reviewed by me and considered in my medical decision making (see chart for details).    MDM Rules/Calculators/A&P                      54 year old female with history of chronic Darier disease, CHF, CKD, asthma, chronic respiratory failure on 3L home O2 presenting with a redness and swelling to the bilateral lower extremities.  There is also a rash to the lower abdomen.  She is also been having worsening shortness of breath and cough.  No constitutional symptoms.  On exam, she appears to have cellulitis to the bilateral lower extremities.  Right is significantly worse than left.  Also considered DVT given that right leg is  significantly more edematous than the left in the setting of acute on chronic shortness of breath.  The patient was seen and independently evaluated by Dr. Wyvonnia Dusky.  Chest x-ray with mild ill-defined opacity in the lateral right lung base concerning for atelectasis versus pneumonia. PE study was obtained.  Study limited secondary to body habitus but, but no central PE was noted.  Given that pulmonary exam had inspiratory and expiratory wheezes as well as rhonchi, suspect asthma or possibly undiagnosed COPD given the patient's history of former tobacco use.  Breathing is improving after albuterol and ipratropium inhaler given.  However, given recent productive cough with concern for possible ill-defined opacity in the right lateral lung base, antibiotics for cellulitis should cover pneumonia.    Also considered acute on chronic CHF exacerbation.  Unfortunately, her body habitus limits ability to assess for volume overload.  BNP is minimally elevated at 120.    In addition to cellulitis of the lower extremities, she appears to have panniculitis.  Overall, abdomen is soft and nontender.  She is having no GI symptoms.  Imaging of the abdomen is not indicated at this time. She also appears to have a candidal rash on her right flank extending to the right back and chest.  Will order nystatin powder.  Creatinine is minimally increased from previous.  Labs are otherwise unremarkable.  The patient is a chronically ill, but has multiple new infectious concerns requiring admission for further evaluation and treatment.  Consult to the hospitalist team and Dr. Hal Hope will accept the patient for admission. The patient appears reasonably stabilized for admission considering the current resources, flow, and capabilities available in the ED at this time, and I doubt any other [facility] requiring further screening and/or treatment in the ED prior to admission.    Final Clinical Impression(s) / ED Diagnoses Final diagnoses:    Cellulitis of right lower extremity  Cellulitis of left leg  Panniculitis  Candidiasis of skin  Exacerbation of asthma, unspecified asthma severity, unspecified whether persistent    Rx / DC Orders ED Discharge Orders    None       Joanne Gavel, PA-C 06/14/19 0829    Ezequiel Essex, MD 06/15/19 0225

## 2019-06-14 NOTE — Plan of Care (Signed)
  Problem: Education: Goal: Knowledge of General Education information will improve Description: Including pain rating scale, medication(s)/side effects and non-pharmacologic comfort measures Outcome: Progressing   Problem: Activity: Goal: Risk for activity intolerance will decrease Outcome: Progressing   Problem: Skin Integrity: Goal: Risk for impaired skin integrity will decrease Outcome: Progressing   

## 2019-06-15 DIAGNOSIS — N1832 Chronic kidney disease, stage 3b: Secondary | ICD-10-CM

## 2019-06-15 LAB — COMPREHENSIVE METABOLIC PANEL
ALT: 15 U/L (ref 0–44)
AST: 12 U/L — ABNORMAL LOW (ref 15–41)
Albumin: 3 g/dL — ABNORMAL LOW (ref 3.5–5.0)
Alkaline Phosphatase: 70 U/L (ref 38–126)
Anion gap: 10 (ref 5–15)
BUN: 21 mg/dL — ABNORMAL HIGH (ref 6–20)
CO2: 32 mmol/L (ref 22–32)
Calcium: 8.9 mg/dL (ref 8.9–10.3)
Chloride: 100 mmol/L (ref 98–111)
Creatinine, Ser: 1.54 mg/dL — ABNORMAL HIGH (ref 0.44–1.00)
GFR calc Af Amer: 44 mL/min — ABNORMAL LOW (ref >60)
GFR calc non Af Amer: 38 mL/min — ABNORMAL LOW (ref >60)
Glucose, Bld: 112 mg/dL — ABNORMAL HIGH (ref 70–99)
Potassium: 5.3 mmol/L — ABNORMAL HIGH (ref 3.5–5.1)
Sodium: 142 mmol/L (ref 135–145)
Total Bilirubin: 0.5 mg/dL (ref 0.3–1.2)
Total Protein: 7.3 g/dL (ref 6.5–8.1)

## 2019-06-15 LAB — CBC WITH DIFFERENTIAL/PLATELET
Abs Immature Granulocytes: 0.09 10*3/uL — ABNORMAL HIGH (ref 0.00–0.07)
Basophils Absolute: 0 10*3/uL (ref 0.0–0.1)
Basophils Relative: 0 %
Eosinophils Absolute: 0.3 10*3/uL (ref 0.0–0.5)
Eosinophils Relative: 3 %
HCT: 38.4 % (ref 36.0–46.0)
Hemoglobin: 11.1 g/dL — ABNORMAL LOW (ref 12.0–15.0)
Immature Granulocytes: 1 %
Lymphocytes Relative: 9 %
Lymphs Abs: 1 10*3/uL (ref 0.7–4.0)
MCH: 29.1 pg (ref 26.0–34.0)
MCHC: 28.9 g/dL — ABNORMAL LOW (ref 30.0–36.0)
MCV: 100.5 fL — ABNORMAL HIGH (ref 80.0–100.0)
Monocytes Absolute: 0.8 10*3/uL (ref 0.1–1.0)
Monocytes Relative: 7 %
Neutro Abs: 9 10*3/uL — ABNORMAL HIGH (ref 1.7–7.7)
Neutrophils Relative %: 80 %
Platelets: 312 10*3/uL (ref 150–400)
RBC: 3.82 MIL/uL — ABNORMAL LOW (ref 3.87–5.11)
RDW: 14.5 % (ref 11.5–15.5)
WBC: 11.1 10*3/uL — ABNORMAL HIGH (ref 4.0–10.5)
nRBC: 0 % (ref 0.0–0.2)

## 2019-06-15 LAB — MAGNESIUM: Magnesium: 1.9 mg/dL (ref 1.7–2.4)

## 2019-06-15 LAB — PHOSPHORUS: Phosphorus: 4.9 mg/dL — ABNORMAL HIGH (ref 2.5–4.6)

## 2019-06-15 MED ORDER — GUAIFENESIN-DM 100-10 MG/5ML PO SYRP
5.0000 mL | ORAL_SOLUTION | ORAL | Status: DC | PRN
  Administered 2019-06-15 – 2019-06-30 (×29): 5 mL via ORAL
  Filled 2019-06-15 (×31): qty 5

## 2019-06-15 MED ORDER — ORAL CARE MOUTH RINSE
15.0000 mL | Freq: Two times a day (BID) | OROMUCOSAL | Status: DC
  Administered 2019-06-15 – 2019-06-30 (×30): 15 mL via OROMUCOSAL

## 2019-06-15 MED ORDER — SODIUM ZIRCONIUM CYCLOSILICATE 10 G PO PACK
10.0000 g | PACK | Freq: Once | ORAL | Status: AC
  Administered 2019-06-15: 10 g via ORAL
  Filled 2019-06-15: qty 1

## 2019-06-15 MED ORDER — ONDANSETRON HCL 4 MG/2ML IJ SOLN
4.0000 mg | Freq: Four times a day (QID) | INTRAMUSCULAR | Status: DC | PRN
  Administered 2019-06-15 – 2019-06-28 (×4): 4 mg via INTRAVENOUS
  Filled 2019-06-15 (×5): qty 2

## 2019-06-15 NOTE — Progress Notes (Signed)
PROGRESS NOTE    Kara Vincent  KKX:[id number] DOB: 12/28/1965 DOA: 06/13/2019 PCP: Ladell Pier, MD  Brief Narrative: HPI per Dr. Gean Birchwood on 06/14/19 Kara Vincent is a 54 y.o. female with care morbid obesity asthma chronic kidney disease stage III hypertension gout presents to the ER because of increasing pain and swelling of the both lower extremities more on the right side.  This has been ongoing for last 1 week.  Patient states she also got more short of breath than usual.  Has some pleuritic type of chest pain denies any productive cough or fever or chills.  Patient in addition also has been having some frontal headache with no focal deficits.  Headache has been present for last few hours.  ED Course: In the ER on exam patient has bilateral lower extremity erythema and swelling of both legs more on the right side.  No signs of any compartment syndrome.  The swelling extends from the ankle up to the mid calf.  Patient also was mildly hypoxic usually uses chronic oxygen at home.  Has not been using her CPAP recently because she lost it.  Chest x-ray shows nonspecific findings for possible opacity.  CT angiogram was unremarkable.  Labs show creatinine 1.8 BNP 120 lactic acid 1.8 hemoglobin 10.6 WBC 11.4 x-ray of the both left-side and right side shows edema.  EKG shows normal sinus rhythm with low voltage.  **Interim History Cellulitis is improving somewhat.  Patient complaining some nausea now.  Still wearing oxygen but wears 3 L at baseline.  Have ordered her CPAP for the night.  Assessment & Plan:   Principal Problem:   Cellulitis of both lower extremities Active Problems:   Asthma exacerbation   CKD (chronic kidney disease) stage 3, GFR 30-59 ml/min (HCC)   OSA (obstructive sleep apnea)   Essential hypertension   Gout   Morbid obesity ([place])   Acute respiratory failure with hypoxia (HCC)   Cellulitis  Cellulitis of the Right Lower Extremity  -for which  patient is on empiric antibiotics with IV Ceftriaxone.   -Will check Dopplers to rule out DVT and showed "No evidence of deep vein thrombosis seen in the lower extremities, bilaterally." -No signs of any compartment syndrome. -Improving slowly WBC went from 12.4 and is now 11.1 -Lactic acid level was 0.7  Acute on chronic respiratory failure hypoxia could be from asthma versus possible fluid likely in the setting of ? Diastolic CHF  OSA and obesity hypoventilation syndrome.   -BNP was mildly elevated at 120.9 -CT angio was without PE -She wears 3 L of supplemental oxygen at home and she went for her -Patient states she takes Lasix 40 mg twice daily.   -For now we will continue the same regimen along with the albuterol HFA which can be changed to nebulizer if Covid test is negative.   -This is likely multifactorial probably due to OSA and obesity hypoventilation syndrome -Closely monitor.  Hypertension  -Continue with clonidine, Imdur, and lisinopril for now  Chronic kidney disease stage IIIb Hyperphosphatemia -Creatinine is appears to be at baseline slightly improved and went from 36/1.585 and is now 21/1.54.   -Patient's phosphorus level was 4.9 -Closely monitor closely since patient is on Lasix and lisinopril. -Avoid further nephrotoxic medications, contrast dyes, hypotension and renally dose medications -Repeat CMP in a.m. 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 -Patient's hemoglobin/hematocrit is stable at 11.1/30.4 -Check  anemia panel in the a.m. -Continue to monitor for signs and symptoms of bleeding; currently no overt bleeding noted -Repeat CBC in a.m.  Hyperkalemia -Mild as patient's potassium is 5.3 -We will give a dose of Lokelma 10 mg x1 -Continue to monitor and trend -Continue with Lasix  -May need to hold lisinopril  History of Gout  -C/w Allopurinol.  History of sleep  apnea  -has not been compliant due to patient recently lost her CPAP.   -CPAP at bedtime.  -?PNA -Mild il-defined opacity on the left side of the lung field on chest x-ray.   -Repeat chest x-ray in the a.m. -Continue with albuterol 2.5 mg IH every 4 hours as needed for wheezing or shortness of breath  HLD -C/w omega-3 acid ethyl esters  GERD -Continue with Pantoprazole 40 p.o. daily  Super Morbid Obesity -Estimated body mass index is 65.05 kg/m as calculated from the following:   Height as of this encounter: 5\' 6"  (1.676 m).   Weight as of this encounter: 182.8 kg. -Weight Loss and Dietary Counseling given   Chronic Pain -Continue with Tylenol 3 p.o. every 8 hours and Percocet 2 tabs p.o. every 6 as needed   DVT prophylaxis: Enoxaparin 40 mg sq q24h Code Status: FULL CODE Family Communication: No family present at bedside Disposition Plan: Pending further clinical improvement of her cellulitis and nausea.  Patient is from home and requiring IV antibiotics for her cellulitis  Consultants:   None  Procedures:  LE VENOUS DUPLEX BILATERAL:  - No evidence of deep vein thrombosis seen in the lower extremities,  bilaterally.   Antimicrobials:  Anti-infectives (From admission, onward)   Start     Dose/Rate Route Frequency Ordered Stop   06/14/19 2000  cefTRIAXone (ROCEPHIN) 1 g in sodium chloride 0.9 % 100 mL IVPB     1 g 200 mL/hr over 30 Minutes Intravenous Every 24 hours 06/14/19 0756     06/14/19 0430  vancomycin (VANCOREADY) IVPB 2000 mg/400 mL     2,000 mg 200 mL/hr over 120 Minutes Intravenous  Once 06/14/19 0430 06/14/19 0700   06/14/19 0400  cefTRIAXone (ROCEPHIN) 1 g in sodium chloride 0.9 % 100 mL IVPB     1 g 200 mL/hr over 30 Minutes Intravenous  Once 06/14/19 0346 06/14/19 0450     Subjective: Seen and examined at bedside and the patient was complaining of some nausea.  No chest pain, lightheadedness or dizziness.  Was also complaining some leg pain and  right lower quadrant pain. No other concerns or complaints at this time.  Objective: Vitals:   06/15/19 0823 06/15/19 0839 06/15/19 1809 06/15/19 1951  BP: 133/82  (!) 142/71 (!) 150/87  Pulse:  75 79 79  Resp:  (!) 21 18 20   Temp:  98 F (36.7 C) 97.8 F (36.6 C) 98.3 F (36.8 C)  TempSrc:  Oral Oral Oral  SpO2:  100% 97% 91%  Weight:      Height:        Intake/Output Summary (Last 24 hours) at 06/15/2019 2038 Last data filed at 06/15/2019 1830 Gross per 24 hour  Intake 640 ml  Output 1850 ml  Net -1210 ml   Filed Weights   06/14/19 1400  Weight: (!) 182.8 kg   Examination: Physical Exam:  Constitutional: WN/WD super morbidly obese Caucasian female in NAD and appears somewhat uncomfortable Eyes: Lids and conjunctivae normal, sclerae anicteric  ENMT: External Ears, Nose appear normal. Grossly normal hearing. Mucous membranes are moist.  Neck: Appears normal, supple, no cervical masses, normal ROM, no appreciable thyromegaly; difficult to assess for JVD given her body habitus Respiratory: Diminished to auscultation bilaterally coarse, no wheezing, rales, rhonchi or crackles. Normal respiratory effort and patient is not tachypenic. No accessory muscle use.  Cardiovascular: RRR, no murmurs / rubs / gallops. S1 and S2 auscultated.  1+ lower extremity edema Abdomen: Soft, complaining of some right lower quadrant pain, significantly distended secondary habitus. No masses palpated. No appreciable hepatosplenomegaly. Bowel sounds positive.  GU: Deferred. Musculoskeletal: No clubbing / cyanosis of digits/nails. No joint deformity upper and lower extremities.  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 Warm and dry.  Neurologic: CN 2-12 grossly intact with no focal deficits. Romberg sign cerebellar reflexes not assessed.  Psychiatric: Normal judgment and insight. Alert and oriented x 3.  Anxious mood and appropriate affect.   Data Reviewed: I have  personally reviewed following labs and imaging studies  CBC: Recent Labs  Lab 06/13/19 1901 06/14/19 0615 06/15/19 0950  WBC 11.4* 12.4* 11.1*  NEUTROABS  --   --  9.0*  HGB 10.6* 9.6* 11.1*  HCT 36.0 33.7* 38.4  MCV 99.7 101.2* 100.5*  PLT 267 233 683   Basic Metabolic Panel: Recent Labs  Lab 06/13/19 1901 06/14/19 0615 06/15/19 0950  NA 140  --  142  K 5.0  --  5.3*  CL 103  --  100  CO2 28  --  32  GLUCOSE 134*  --  112*  BUN 36*  --  21*  CREATININE 1.85* 1.68* 1.54*  CALCIUM 8.4*  --  8.9  MG  --   --  1.9  PHOS  --   --  4.9*   GFR: Estimated Creatinine Clearance: 70.8 mL/min (A) (by C-G formula based on SCr of 1.54 mg/dL (H)). Liver Function Tests: Recent Labs  Lab 06/13/19 1901 06/15/19 0950  AST 18 12*  ALT 14 15  ALKPHOS 66 70  BILITOT 0.5 0.5  PROT 7.1 7.3  ALBUMIN 3.1* 3.0*   No results for input(s): LIPASE, AMYLASE in the last 168 hours. No results for input(s): AMMONIA in the last 168 hours. Coagulation Profile: No results for input(s): INR, PROTIME in the last 168 hours. Cardiac Enzymes: No results for input(s): CKTOTAL, CKMB, CKMBINDEX, TROPONINI in the last 168 hours. BNP (last 3 results) No results for input(s): PROBNP in the last 8760 hours. HbA1C: No results for input(s): HGBA1C in the last 72 hours. CBG: No results for input(s): GLUCAP in the last 168 hours. Lipid Profile: No results for input(s): CHOL, HDL, LDLCALC, TRIG, CHOLHDL, LDLDIRECT in the last 72 hours. Thyroid Function Tests: No results for input(s): TSH, T4TOTAL, FREET4, T3FREE, THYROIDAB in the last 72 hours. Anemia Panel: No results for input(s): VITAMINB12, FOLATE, FERRITIN, TIBC, IRON, RETICCTPCT in the last 72 hours. Sepsis Labs: Recent Labs  Lab 06/13/19 1902 06/14/19 0044  LATICACIDVEN 0.8 0.7    Recent Results (from the past 240 hour(s))  SARS CORONAVIRUS 2 (TAT 6-24 HRS) Nasopharyngeal Nasopharyngeal Swab     Status: None   Collection Time: 06/14/19   5:15 AM   Specimen: Nasopharyngeal Swab  Result Value Ref Range Status   SARS Coronavirus 2 NEGATIVE NEGATIVE Final    Comment: (NOTE) SARS-CoV-2 target nucleic acids are NOT DETECTED. The SARS-CoV-2 RNA is generally detectable in upper and lower respiratory specimens during the acute phase of infection. Negative results do not preclude SARS-CoV-2 infection, do not rule out co-infections  with other pathogens, and should not be used as the sole basis for treatment or other patient management decisions. Negative results must be combined with clinical observations, patient history, and epidemiological information. The expected result is Negative. Fact Sheet for Patients: SugarRoll.be Fact Sheet for Healthcare Providers: https://www.woods-mathews.com/ This test is not yet approved or cleared by the [place] FDA and  has been authorized for detection and/or diagnosis of SARS-CoV-2 by FDA under an Emergency Use Authorization (EUA). This EUA will remain  in effect (meaning this test can be used) for the duration of the COVID-19 declaration under Section 56 4(b)(1) of the Act, 21 U.S.C. section 360bbb-3(b)(1), unless the authorization is terminated or revoked sooner. Performed at [facility], [place] [address] [place], [place] [id number]   MRSA PCR Screening     Status: None   Collection Time: 06/14/19  4:25 PM   Specimen: Nasal Mucosa; Nasopharyngeal  Result Value Ref Range Status   MRSA by PCR NEGATIVE NEGATIVE Final    Comment:        The GeneXpert MRSA Assay (FDA approved for NASAL specimens only), is one component of a comprehensive MRSA colonization surveillance program. It is not intended to diagnose MRSA infection nor to guide or monitor treatment for MRSA infections. Performed at [facility], [place] [address], [place], [place] [address]      RN Pressure Injury Documentation:     Estimated body mass index is 65.05  kg/m as calculated from the following:   Height as of this encounter: 5\' 6"  (1.676 m).   Weight as of this encounter: 182.8 kg.  Malnutrition Type:      Malnutrition Characteristics:      Nutrition Interventions:     Radiology Studies: CT Angio Chest PE W and/or Wo Contrast  Result Date: 06/14/2019 CLINICAL DATA:  Right leg pain and redness with swelling for 1 week EXAM: CT ANGIOGRAPHY CHEST WITH CONTRAST TECHNIQUE: Multidetector CT imaging of the chest was performed using the standard protocol during bolus administration of intravenous contrast. Multiplanar CT image reconstructions and MIPs were obtained to evaluate the vascular anatomy. CONTRAST:  99mL OMNIPAQUE IOHEXOL 350 MG/ML SOLN COMPARISON:  03/12/2019 FINDINGS: Cardiovascular: Limited opacification of the pulmonary arteries due to body habitus and bolus dispersion. No pulmonary embolism is seen, definitely nondiagnostic beyond the main and lobar level. Coronary calcification. No cardiomegaly or pericardial effusion. Mild atherosclerotic calcification. Mediastinum/Nodes: Negative for adenopathy or mass. Lungs/Pleura: Linear atelectasis/scarring. There is no edema, consolidation, effusion, or pneumothorax. Symmetric narrowing of the bilateral mainstem bronchi, likely bronchomalacia. Upper Abdomen: No acute finding Musculoskeletal: Spondylosis.  No acute or aggressive finding Review of the MIP images confirms the above findings. IMPRESSION: 1. Severely limited chest CTA due to body habitus and bolus dispersion. No pulmonary embolism is seen at the main or lobar levels. 2.  Aortic Atherosclerosis (ICD10-I70.0).  Coronary atherosclerosis Electronically Signed   By: Monte Fantasia M.D.   On: 06/14/2019 04:08   VAS [place] LOWER EXTREMITY VENOUS (DVT)  Result Date: 06/14/2019  Lower Venous DVTStudy Indications: Edema.  Limitations: Body habitus and poor ultrasound/tissue interface. Comparison Study: 07/07/17 previous Performing Technologist:  Abram Sander RVS  Examination Guidelines: A complete evaluation includes B-mode imaging, spectral Doppler, color Doppler, and power Doppler as needed of all accessible portions of each vessel. Bilateral testing is considered an integral part of a complete examination. Limited examinations for reoccurring indications may be performed as noted. The reflux portion of the exam is performed with the patient  in reverse Trendelenburg.  +---------+---------------+---------+-----------+----------+--------------+ RIGHT    CompressibilityPhasicitySpontaneityPropertiesThrombus Aging +---------+---------------+---------+-----------+----------+--------------+ CFV      Full           Yes      Yes                                 +---------+---------------+---------+-----------+----------+--------------+ SFJ      Full                                                        +---------+---------------+---------+-----------+----------+--------------+ FV Prox  Full                                                        +---------+---------------+---------+-----------+----------+--------------+ FV Mid   Full                                                        +---------+---------------+---------+-----------+----------+--------------+ FV DistalFull                                                        +---------+---------------+---------+-----------+----------+--------------+ PFV      Full                                                        +---------+---------------+---------+-----------+----------+--------------+ POP      Full           Yes      Yes                                 +---------+---------------+---------+-----------+----------+--------------+ PTV                                                   Not visualized +---------+---------------+---------+-----------+----------+--------------+ PERO                                                  Not  visualized +---------+---------------+---------+-----------+----------+--------------+   +---------+---------------+---------+-----------+----------+--------------+ LEFT     CompressibilityPhasicitySpontaneityPropertiesThrombus Aging +---------+---------------+---------+-----------+----------+--------------+ CFV      Full           Yes      Yes                                 +---------+---------------+---------+-----------+----------+--------------+  SFJ      Full                                                        +---------+---------------+---------+-----------+----------+--------------+ FV Prox  Full                                                        +---------+---------------+---------+-----------+----------+--------------+ FV Mid   Full                                                        +---------+---------------+---------+-----------+----------+--------------+ FV DistalFull                                                        +---------+---------------+---------+-----------+----------+--------------+ PFV      Full                                                        +---------+---------------+---------+-----------+----------+--------------+ POP      Full           Yes      Yes                                 +---------+---------------+---------+-----------+----------+--------------+ PTV      Full                                                        +---------+---------------+---------+-----------+----------+--------------+ PERO                                                  Not visualized +---------+---------------+---------+-----------+----------+--------------+     Summary: BILATERAL: - No evidence of deep vein thrombosis seen in the lower extremities, bilaterally.   *See table(s) above for measurements and observations. Electronically signed by Deitra Mayo MD on 06/14/2019 at 1:09:05 PM.    Final    Scheduled  Meds: . allopurinol  100 mg Oral Daily  . cloNIDine  0.2 mg Oral BID  . darifenacin  7.5 mg Oral Daily  . DULoxetine  30 mg Oral Daily  . enoxaparin (LOVENOX) injection  40 mg Subcutaneous Q24H  . furosemide  40 mg Oral BID  . isosorbide mononitrate  30 mg Oral Daily  . lisinopril  40 mg Oral Daily  . mouth rinse  15 mL  Mouth Rinse BID  . omega-3 acid ethyl esters  1 g Oral Daily  . pantoprazole  40 mg Oral Daily  . triamcinolone cream   Topical BID  . vitamin E  100 Units Oral Daily   Continuous Infusions: . cefTRIAXone (ROCEPHIN)  IV 1 g (06/14/19 2139)    LOS: 1 day   Kerney Elbe, DO [facility] PAGER is on [place]  If 7PM-7AM, please contact night-coverage [email or web address]

## 2019-06-16 ENCOUNTER — Inpatient Hospital Stay (HOSPITAL_COMMUNITY): Payer: Medicaid Other

## 2019-06-16 ENCOUNTER — Other Ambulatory Visit: Payer: Self-pay

## 2019-06-16 ENCOUNTER — Encounter (HOSPITAL_COMMUNITY): Payer: Self-pay | Admitting: Internal Medicine

## 2019-06-16 DIAGNOSIS — L0889 Other specified local infections of the skin and subcutaneous tissue: Secondary | ICD-10-CM

## 2019-06-16 LAB — CBC WITH DIFFERENTIAL/PLATELET
Abs Immature Granulocytes: 0.09 10*3/uL — ABNORMAL HIGH (ref 0.00–0.07)
Basophils Absolute: 0.1 10*3/uL (ref 0.0–0.1)
Basophils Relative: 0 %
Eosinophils Absolute: 0.2 10*3/uL (ref 0.0–0.5)
Eosinophils Relative: 1 %
HCT: 39 % (ref 36.0–46.0)
Hemoglobin: 11.3 g/dL — ABNORMAL LOW (ref 12.0–15.0)
Immature Granulocytes: 1 %
Lymphocytes Relative: 10 %
Lymphs Abs: 1.3 10*3/uL (ref 0.7–4.0)
MCH: 29 pg (ref 26.0–34.0)
MCHC: 29 g/dL — ABNORMAL LOW (ref 30.0–36.0)
MCV: 100.3 fL — ABNORMAL HIGH (ref 80.0–100.0)
Monocytes Absolute: 0.9 10*3/uL (ref 0.1–1.0)
Monocytes Relative: 7 %
Neutro Abs: 10 10*3/uL — ABNORMAL HIGH (ref 1.7–7.7)
Neutrophils Relative %: 81 %
Platelets: 350 10*3/uL (ref 150–400)
RBC: 3.89 MIL/uL (ref 3.87–5.11)
RDW: 14.2 % (ref 11.5–15.5)
WBC: 12.4 10*3/uL — ABNORMAL HIGH (ref 4.0–10.5)
nRBC: 0 % (ref 0.0–0.2)

## 2019-06-16 LAB — COMPREHENSIVE METABOLIC PANEL
ALT: 15 U/L (ref 0–44)
AST: 13 U/L — ABNORMAL LOW (ref 15–41)
Albumin: 3 g/dL — ABNORMAL LOW (ref 3.5–5.0)
Alkaline Phosphatase: 72 U/L (ref 38–126)
Anion gap: 9 (ref 5–15)
BUN: 22 mg/dL — ABNORMAL HIGH (ref 6–20)
CO2: 34 mmol/L — ABNORMAL HIGH (ref 22–32)
Calcium: 8.7 mg/dL — ABNORMAL LOW (ref 8.9–10.3)
Chloride: 98 mmol/L (ref 98–111)
Creatinine, Ser: 1.4 mg/dL — ABNORMAL HIGH (ref 0.44–1.00)
GFR calc Af Amer: 49 mL/min — ABNORMAL LOW (ref >60)
GFR calc non Af Amer: 42 mL/min — ABNORMAL LOW (ref >60)
Glucose, Bld: 125 mg/dL — ABNORMAL HIGH (ref 70–99)
Potassium: 4.9 mmol/L (ref 3.5–5.1)
Sodium: 141 mmol/L (ref 135–145)
Total Bilirubin: 0.5 mg/dL (ref 0.3–1.2)
Total Protein: 7.5 g/dL (ref 6.5–8.1)

## 2019-06-16 LAB — MAGNESIUM: Magnesium: 1.7 mg/dL (ref 1.7–2.4)

## 2019-06-16 LAB — PHOSPHORUS: Phosphorus: 4.8 mg/dL — ABNORMAL HIGH (ref 2.5–4.6)

## 2019-06-16 MED ORDER — ENOXAPARIN SODIUM 100 MG/ML ~~LOC~~ SOLN
90.0000 mg | SUBCUTANEOUS | Status: DC
  Filled 2019-06-16 (×2): qty 1

## 2019-06-16 MED ORDER — PERMETHRIN 5 % EX CREA
1.0000 "application " | TOPICAL_CREAM | CUTANEOUS | Status: DC
  Filled 2019-06-16: qty 60

## 2019-06-16 MED ORDER — BISACODYL 10 MG RE SUPP
10.0000 mg | Freq: Every day | RECTAL | Status: DC | PRN
  Administered 2019-06-17: 10 mg via RECTAL
  Filled 2019-06-16: qty 1

## 2019-06-16 MED ORDER — POLYETHYLENE GLYCOL 3350 17 G PO PACK
17.0000 g | PACK | Freq: Two times a day (BID) | ORAL | Status: DC
  Administered 2019-06-16 – 2019-06-28 (×9): 17 g via ORAL
  Filled 2019-06-16 (×22): qty 1

## 2019-06-16 MED ORDER — SENNOSIDES-DOCUSATE SODIUM 8.6-50 MG PO TABS
1.0000 | ORAL_TABLET | Freq: Two times a day (BID) | ORAL | Status: DC
  Administered 2019-06-16 – 2019-06-30 (×25): 1 via ORAL
  Filled 2019-06-16 (×25): qty 1

## 2019-06-16 NOTE — Progress Notes (Signed)
Pt. Decided not to wear cpap tonight.

## 2019-06-16 NOTE — Progress Notes (Signed)
Friend at the bedside stated that he has concerns for the pt having bedbugs bite. He showed the student nurse pictures of the mattress that showed that it was infested with bedbugs and even showed the student nurse the bites on his arms.   The friend stated that there is a lawsuit with the motel regarding the bedbug infestation to staff.    Friend came out of the room and told this RN and [place], NT that pt had bedbugs in the motel. Friend was told that the pt would have to be on contact precautions per hospital policy. Friend stated staff "were exaggerating" and continued to use profanity towards staff. Friend advised to leave if not security to be called and he stated "call Jesus" and left the floor.   Pt now on contact precautions, nurse for pt made aware, Marjean Amayia was notified.    Paulla Fore, RN, BSN

## 2019-06-16 NOTE — Progress Notes (Signed)
Patient attempted to wear CPAP at night.  Unable to tolerate.  Switched back to O2 @ 4 LPM [place].  Will continue to monitor patient.  Earleen Reaper RN

## 2019-06-16 NOTE — Progress Notes (Signed)
   06/15/19 2141  Provider Notification  Provider Name/Title Ardith Dark  Date Provider Notified 06/15/19  Time Provider Notified 2141  Notification Type Page  Notification Reason Requested by patient/family (wants Robitussin)  Response See new orders   Patient is now complaining of non-productive cough.  [facility] called and made aware.  Received order for Robitussin DM.  Will continue to monitor patient.  Earleen Reaper RN

## 2019-06-16 NOTE — Plan of Care (Signed)
  Problem: Clinical Measurements: Goal: Diagnostic test results will improve Outcome: Progressing   

## 2019-06-16 NOTE — Progress Notes (Signed)
   06/15/19 2054  Provider Notification  Provider Name/Title Ardith Dark  Date Provider Notified 06/15/19  Time Provider Notified 2054  Notification Type Page  Notification Reason Requested by patient/family (CPAP/Pain/Nausea/KPAD.)  Response See new orders   At beginning of the shift, the patient had many complaints and concerns.  She is complaining of nausea.  Also, states she wears CPAP at night.  She also have severe generalized pain.  Rates as 9/10 or higher.  She states the Percocet and the Tylenol 3 are not helping.  Also, she is using multiple disposable heating pack to her back secondary to back pain.  [facility] made aware.  Order received for CPAP, Zofran, and KPAD.  Advised to alternate the Tylenol 3 and the Percocet.  The Plan of Care was discussed with patient.  Will continue to monitor patient.  Earleen Reaper RN

## 2019-06-16 NOTE — Progress Notes (Signed)
PROGRESS NOTE    Kara Vincent  IRW:[id number] DOB: [birth year] DOA: 06/13/2019 PCP: Ladell Pier, MD  Brief Narrative: HPI per Dr. Gean Birchwood on 06/14/19 Kara Vincent is a 54 y.o. female with care morbid obesity asthma chronic kidney disease stage III hypertension gout presents to the ER because of increasing pain and swelling of the both lower extremities more on the right side.  This has been ongoing for last 1 week.  Patient states she also got more short of breath than usual.  Has some pleuritic type of chest pain denies any productive cough or fever or chills.  Patient in addition also has been having some frontal headache with no focal deficits.  Headache has been present for last few hours.  ED Course: In the ER on exam patient has bilateral lower extremity erythema and swelling of both legs more on the right side.  No signs of any compartment syndrome.  The swelling extends from the ankle up to the mid calf.  Patient also was mildly hypoxic usually uses chronic oxygen at home.  Has not been using her CPAP recently because she lost it.  Chest x-ray shows nonspecific findings for possible opacity.  CT angiogram was unremarkable.  Labs show creatinine 1.8 BNP 120 lactic acid 1.8 hemoglobin 10.6 WBC 11.4 x-ray of the both left-side and right side shows edema.  EKG shows normal sinus rhythm with low voltage.  **Interim History Cellulitis is improving somewhat.  Patient complaining some nausea now.  Still wearing oxygen but wears 3 L at baseline.  Have ordered her CPAP for the night.  She apparently has bedbugs so we will give her permethrin 5% cream apply topically from the neck down.  Cellulitis appears to be improving.  Patient was little somnolent but easily arousable and states that she is feeling a little bit better today.  Assessment & Plan:   Principal Problem:   Cellulitis of both lower extremities Active Problems:   Asthma exacerbation   CKD (chronic kidney  disease) stage 3, GFR 30-59 ml/min (HCC)   OSA (obstructive sleep apnea)   Essential hypertension   Gout   Morbid obesity ([place])   Acute respiratory failure with hypoxia (HCC)   Cellulitis  Cellulitis of the Right Lower Extremity  -for which patient is on empiric antibiotics with IV Ceftriaxone.  And will continue -Will check Dopplers to rule out DVT and showed "No evidence of deep vein thrombosis seen in the lower extremities, bilaterally." -No signs of any compartment syndrome. -WBC has been hovering around 11 and 12 -Lactic acid level was 0.7 -Continue monitor temperature curve and continue monitor clinical/letter intervention  Acute on chronic respiratory failure hypoxia could be from asthma versus possible fluid likely in the setting of ? Diastolic CHF  OSA and obesity hypoventilation syndrome.   -BNP was mildly elevated at 120.9 -CT angio was without PE -She wears 3 L of supplemental oxygen at home and she went for her -Patient states she takes Lasix 40 mg twice daily.   -For now we will continue the same regimen along with the albuterol HFA which can be changed to nebulizer if Covid test is negative.   -This is likely multifactorial probably due to OSA and obesity hypoventilation syndrome -Closely monitor. -Continue CPAP at night -We will need an ambulatory home O2 screen prior to discharge  Hypertension  -Continue with clonidine, Imdur, and lisinopril for now  Chronic kidney disease stage IIIb improving Hyperphosphatemia -Creatinine is appears to be at baseline  slightly improved and went from 36/1.585 and is now 22/1.40 -Patient's phosphorus level was 4.9 -Closely monitor closely since patient is on Lasix and lisinopril. -Avoid further nephrotoxic medications, contrast dyes, hypotension and renally dose medications -Repeat CMP in a.m. 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  -Patient's hemoglobin/hematocrit is stable at 11.3/39.0 -Check anemia panel in the a.m. -Continue to monitor for signs and symptoms of bleeding; currently no overt bleeding noted -Repeat CBC in a.m.  Hyperkalemia -Mild as patient's potassium is 5.3; now improved after Lokelma is 4.8 -Given a dose of Lokelma 10 mg x1 yesterday -Continue to monitor and trend -Continue with Lasix  -May need to hold lisinopril  History of Gout  -C/w Allopurinol.  History of Sleep Apnea  -has not been compliant due to patient recently lost her CPAP.   -CPAP at bedtime.  -?PNA -Mild il-defined opacity on the left side of the lung field on chest x-ray.   -Repeat chest x-ray in the a.m. -Continue with albuterol 2.5 mg IH every 4 hours as needed for wheezing or shortness of breath  HLD -C/w omega-3 acid ethyl esters  GERD -Continue with Pantoprazole 40 p.o. daily  Super Morbid Obesity -Estimated body mass index is 65.05 kg/m as calculated from the following:   Height as of this encounter: 5\' 6"  (1.676 m).   Weight as of this encounter: 182.8 kg. -Weight Loss and Dietary Counseling given   Chronic Pain -Continue with Tylenol 3 p.o. every 8 hours and Percocet 2 tabs p.o. every 6 as needed  Constipation -DG Abd showed "Air distention of nondescript bowel loop within the right hemiabdomen, nonspecific. Large stool burden within the right colon and distal colon/rectum." -Start the patient on adequate bowel regimen and try suppository  Bedbugs -Recently exposed in a motel that had bedbugs with mattress from infested -Recommend washing all belongings in extremely hot water if cannot wash belongings and place him in a trash bag and leave him for 1 week -We will apply permethrin cream 5% topically from the neck down overnight and repeat in 1 week -Continue with contact precautions   DVT prophylaxis: Enoxaparin 40 mg sq q24h Code Status: FULL CODE Family Communication: No family present at  bedside Disposition Plan: Pending further clinical improvement of her cellulitis and nausea.  Patient is from home and requiring IV antibiotics for her cellulitis  Consultants:   None  Procedures:  LE VENOUS DUPLEX BILATERAL:  - No evidence of deep vein thrombosis seen in the lower extremities,  bilaterally.   Antimicrobials:  Anti-infectives (From admission, onward)   Start     Dose/Rate Route Frequency Ordered Stop   06/14/19 2000  cefTRIAXone (ROCEPHIN) 1 g in sodium chloride 0.9 % 100 mL IVPB     1 g 200 mL/hr over 30 Minutes Intravenous Every 24 hours 06/14/19 0756     06/14/19 0430  vancomycin (VANCOREADY) IVPB 2000 mg/400 mL     2,000 mg 200 mL/hr over 120 Minutes Intravenous  Once 06/14/19 0430 06/14/19 0700   06/14/19 0400  cefTRIAXone (ROCEPHIN) 1 g in sodium chloride 0.9 % 100 mL IVPB     1 g 200 mL/hr over 30 Minutes Intravenous  Once 06/14/19 0346 06/14/19 0450     Subjective: Seen and examined at bedside and [place] precautions given the bedbugs that she has been exposed to.  She was sleeping and I woke her up and I asked her if she  is nauseous and she said "yes".  States that she is getting a little bit better.  Denies any abdominal pain today.  Leg is not as warm or hot.  No chest pain, lightheadedness or dizziness.  No other concerns or complaints at this time and she is a little somnolent but easily arousable.  Objective: Vitals:   06/15/19 1951 06/15/19 2046 06/16/19 0453 06/16/19 0924  BP: (!) 150/87  (!) 148/89 (!) 148/89  Pulse: 79  83   Resp: 20  20   Temp: 98.3 F (36.8 C)  97.7 F (36.5 C)   TempSrc: Oral  Oral   SpO2: 91% 92% 100%   Weight:      Height:        Intake/Output Summary (Last 24 hours) at 06/16/2019 1700 Last data filed at 06/16/2019 1130 Gross per 24 hour  Intake 700.34 ml  Output 800 ml  Net -99.66 ml   Filed Weights   06/14/19 1400  Weight: (!) 182.8 kg   Examination: Physical Exam:  Constitutional: WN/WD Caucasian female  currently in no acute distress and she is somnolent and sleeping easily arousable and does not appear uncomfortable but she is complaining of some nausea Eyes: Lids and conjunctivae normal, sclerae anicteric  ENMT: External Ears, Nose appear normal. Grossly normal hearing. Mucous membranes are moist. Neck: Appears normal, supple, no cervical masses, normal ROM, no appreciable thyromegaly; difficult to assess JVD given her body habitus Respiratory: Diminished to auscultation bilaterally, no wheezing, rales, rhonchi or crackles. Normal respiratory effort and patient is not tachypenic. No accessory muscle use.  Wearing supplemental oxygen via nasal cannula but has unlabored breathing Cardiovascular: RRR, no murmurs / rubs / gallops. S1 and S2 auscultated.  1+ extremity edema.  Abdomen: Soft, slightly-tender, distended secondary body habitus. Bowel sounds positive.  GU: Deferred. Musculoskeletal: No clubbing / cyanosis of digits/nails. No joint deformity upper and lower extremities.  Skin: Right leg erythema is improved and his leg is not as warm.  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 Neurologic: CN 2-12 grossly intact with no focal deficits when she is fully awoken; Romberg sign and cerebellar reflexes not assessed.  Psychiatric: Normal judgment and insight. Alert and oriented x 3.  Somnolent but easily arousable  Data Reviewed: I have personally reviewed following labs and imaging studies  CBC: Recent Labs  Lab 06/13/19 1901 06/14/19 0615 06/15/19 0950 06/16/19 0326  WBC 11.4* 12.4* 11.1* 12.4*  NEUTROABS  --   --  9.0* 10.0*  HGB 10.6* 9.6* 11.1* 11.3*  HCT 36.0 33.7* 38.4 39.0  MCV 99.7 101.2* 100.5* 100.3*  PLT 267 233 312 585   Basic Metabolic Panel: Recent Labs  Lab 06/13/19 1901 06/14/19 0615 06/15/19 0950 06/16/19 0326  NA 140  --  142 141  K 5.0  --  5.3* 4.9  CL 103  --  100 98  CO2 28  --   32 34*  GLUCOSE 134*  --  112* 125*  BUN 36*  --  21* 22*  CREATININE 1.85* 1.68* 1.54* 1.40*  CALCIUM 8.4*  --  8.9 8.7*  MG  --   --  1.9 1.7  PHOS  --   --  4.9* 4.8*   GFR: Estimated Creatinine Clearance: 77.9 mL/min (A) (by C-G formula based on SCr of 1.4 mg/dL (H)). Liver Function Tests: Recent Labs  Lab 06/13/19 1901 06/15/19 0950 06/16/19 0326  AST  18 12* 13*  ALT 14 15 15   ALKPHOS 66 70 72  BILITOT 0.5 0.5 0.5  PROT 7.1 7.3 7.5  ALBUMIN 3.1* 3.0* 3.0*   No results for input(s): LIPASE, AMYLASE in the last 168 hours. No results for input(s): AMMONIA in the last 168 hours. Coagulation Profile: No results for input(s): INR, PROTIME in the last 168 hours. Cardiac Enzymes: No results for input(s): CKTOTAL, CKMB, CKMBINDEX, TROPONINI in the last 168 hours. BNP (last 3 results) No results for input(s): PROBNP in the last 8760 hours. HbA1C: No results for input(s): HGBA1C in the last 72 hours. CBG: No results for input(s): GLUCAP in the last 168 hours. Lipid Profile: No results for input(s): CHOL, HDL, LDLCALC, TRIG, CHOLHDL, LDLDIRECT in the last 72 hours. Thyroid Function Tests: No results for input(s): TSH, T4TOTAL, FREET4, T3FREE, THYROIDAB in the last 72 hours. Anemia Panel: No results for input(s): VITAMINB12, FOLATE, FERRITIN, TIBC, IRON, RETICCTPCT in the last 72 hours. Sepsis Labs: Recent Labs  Lab 06/13/19 1902 06/14/19 0044  LATICACIDVEN 0.8 0.7    Recent Results (from the past 240 hour(s))  SARS CORONAVIRUS 2 (TAT 6-24 HRS) Nasopharyngeal Nasopharyngeal Swab     Status: None   Collection Time: 06/14/19  5:15 AM   Specimen: Nasopharyngeal Swab  Result Value Ref Range Status   SARS Coronavirus 2 NEGATIVE NEGATIVE Final    Comment: (NOTE) SARS-CoV-2 target nucleic acids are NOT DETECTED. The SARS-CoV-2 RNA is generally detectable in upper and lower respiratory specimens during the acute phase of infection. Negative results do not preclude  SARS-CoV-2 infection, do not rule out co-infections with other pathogens, and should not be used as the sole basis for treatment or other patient management decisions. Negative results must be combined with clinical observations, patient history, and epidemiological information. The expected result is Negative. Fact Sheet for Patients: SugarRoll.be Fact Sheet for Healthcare Providers: https://www.woods-mathews.com/ This test is not yet approved or cleared by the [place] FDA and  has been authorized for detection and/or diagnosis of SARS-CoV-2 by FDA under an Emergency Use Authorization (EUA). This EUA will remain  in effect (meaning this test can be used) for the duration of the COVID-19 declaration under Section 56 4(b)(1) of the Act, 21 U.S.C. section 360bbb-3(b)(1), unless the authorization is terminated or revoked sooner. Performed at [facility], [place] [address], [place], [place] [address]   MRSA PCR Screening     Status: None   Collection Time: 06/14/19  4:25 PM   Specimen: Nasal Mucosa; Nasopharyngeal  Result Value Ref Range Status   MRSA by PCR NEGATIVE NEGATIVE Final    Comment:        The GeneXpert MRSA Assay (FDA approved for NASAL specimens only), is one component of a comprehensive MRSA colonization surveillance program. It is not intended to diagnose MRSA infection nor to guide or monitor treatment for MRSA infections. Performed at [facility], [place] [address], [place],  [address]      RN Pressure Injury Documentation:     Estimated body mass index is 65.05 kg/m as calculated from the following:   Height as of this encounter: 5\' 6"  (1.676 m).   Weight as of this encounter: 182.8 kg.  Malnutrition Type:      Malnutrition Characteristics:      Nutrition Interventions:     Radiology Studies: DG Abd 1 View  Result Date: 06/16/2019 CLINICAL DATA:  Abdominal pain with nausea and  headaches. EXAM: ABDOMEN - 1 VIEW COMPARISON:  Abdominal radiographs CT abdomen/pelvis 11/28/2011. 12/27/2011 FINDINGS: Air distention of a nondescript bowel loop within the right hemiabdomen. Large colonic stool burden within the right colon and distal colon/rectum. No acute bony abnormality. Thoracic spondylosis. IMPRESSION: Air distention of nondescript bowel loop within the right hemiabdomen, nonspecific. Large stool burden within the right colon and distal colon/rectum. Electronically Signed   By: Kellie Simmering DO   On: 06/16/2019 09:08   Scheduled Meds: . allopurinol  100 mg Oral Daily  . cloNIDine  0.2 mg Oral BID  . darifenacin  7.5 mg Oral Daily  . DULoxetine  30 mg Oral Daily  . [START ON 06/17/2019] enoxaparin (LOVENOX) injection  90 mg Subcutaneous Q24H  . furosemide  40 mg Oral BID  . isosorbide mononitrate  30 mg Oral Daily  . lisinopril  40 mg Oral Daily  . mouth rinse  15 mL Mouth Rinse BID  . omega-3 acid ethyl esters  1 g Oral Daily  . pantoprazole  40 mg Oral Daily  . permethrin   Topical Once  . triamcinolone cream   Topical BID  . vitamin E  100 Units Oral Daily   Continuous Infusions: . cefTRIAXone (ROCEPHIN)  IV 1 g (06/15/19 2125)    LOS: 2 days   Kerney Elbe, DO [facility] PAGER is on [place]  If 7PM-7AM, please contact night-coverage [email or web address]

## 2019-06-17 ENCOUNTER — Inpatient Hospital Stay (HOSPITAL_COMMUNITY)
Admit: 2019-06-17 | Discharge: 2019-06-17 | Disposition: A | Discharge status: Home / Self Care | Payer: Medicaid Other | Attending: Internal Medicine | Admitting: Internal Medicine

## 2019-06-17 DIAGNOSIS — J9602 Acute respiratory failure with hypercapnia: Secondary | ICD-10-CM

## 2019-06-17 DIAGNOSIS — R252 Cramp and spasm: Secondary | ICD-10-CM

## 2019-06-17 DIAGNOSIS — R278 Other lack of coordination: Secondary | ICD-10-CM

## 2019-06-17 LAB — CBC WITH DIFFERENTIAL/PLATELET
Abs Immature Granulocytes: 0.12 10*3/uL — ABNORMAL HIGH (ref 0.00–0.07)
Basophils Absolute: 0.1 10*3/uL (ref 0.0–0.1)
Basophils Relative: 0 %
Eosinophils Absolute: 0.2 10*3/uL (ref 0.0–0.5)
Eosinophils Relative: 1 %
HCT: 37.5 % (ref 36.0–46.0)
Hemoglobin: 10.6 g/dL — ABNORMAL LOW (ref 12.0–15.0)
Immature Granulocytes: 1 %
Lymphocytes Relative: 12 %
Lymphs Abs: 1.5 10*3/uL (ref 0.7–4.0)
MCH: 29.4 pg (ref 26.0–34.0)
MCHC: 28.3 g/dL — ABNORMAL LOW (ref 30.0–36.0)
MCV: 103.9 fL — ABNORMAL HIGH (ref 80.0–100.0)
Monocytes Absolute: 0.8 10*3/uL (ref 0.1–1.0)
Monocytes Relative: 6 %
Neutro Abs: 10.4 10*3/uL — ABNORMAL HIGH (ref 1.7–7.7)
Neutrophils Relative %: 80 %
Platelets: 365 10*3/uL (ref 150–400)
RBC: 3.61 MIL/uL — ABNORMAL LOW (ref 3.87–5.11)
RDW: 14.3 % (ref 11.5–15.5)
WBC: 13.1 10*3/uL — ABNORMAL HIGH (ref 4.0–10.5)
nRBC: 0.2 % (ref 0.0–0.2)

## 2019-06-17 LAB — BLOOD GAS, ARTERIAL
Acid-Base Excess: 10.5 mmol/L — ABNORMAL HIGH (ref 0.0–2.0)
Acid-Base Excess: 11.5 mmol/L — ABNORMAL HIGH (ref 0.0–2.0)
Bicarbonate: 38.2 mmol/L — ABNORMAL HIGH (ref 20.0–28.0)
Bicarbonate: 38.6 mmol/L — ABNORMAL HIGH (ref 20.0–28.0)
Drawn by: 53527
Drawn by: 57060
FIO2: 36
FIO2: 21
O2 Saturation: 94.3 %
O2 Saturation: 73.9 %
Patient temperature: 36.7
Patient temperature: 37
pCO2 arterial: 95.2 mmHg (ref 32.0–48.0)
pCO2 arterial: 88.7 mmHg (ref 32.0–48.0)
pH, Arterial: 7.225 — ABNORMAL LOW (ref 7.350–7.450)
pH, Arterial: 7.262 — ABNORMAL LOW (ref 7.350–7.450)
pO2, Arterial: 79.6 mmHg — ABNORMAL LOW (ref 83.0–108.0)
pO2, Arterial: 43.8 mmHg — ABNORMAL LOW (ref 83.0–108.0)

## 2019-06-17 LAB — COMPREHENSIVE METABOLIC PANEL
ALT: 14 U/L (ref 0–44)
AST: 10 U/L — ABNORMAL LOW (ref 15–41)
Albumin: 3 g/dL — ABNORMAL LOW (ref 3.5–5.0)
Alkaline Phosphatase: 64 U/L (ref 38–126)
Anion gap: 11 (ref 5–15)
BUN: 27 mg/dL — ABNORMAL HIGH (ref 6–20)
CO2: 36 mmol/L — ABNORMAL HIGH (ref 22–32)
Calcium: 8.8 mg/dL — ABNORMAL LOW (ref 8.9–10.3)
Chloride: 95 mmol/L — ABNORMAL LOW (ref 98–111)
Creatinine, Ser: 1.66 mg/dL — ABNORMAL HIGH (ref 0.44–1.00)
GFR calc Af Amer: 40 mL/min — ABNORMAL LOW (ref >60)
GFR calc non Af Amer: 34 mL/min — ABNORMAL LOW (ref >60)
Glucose, Bld: 111 mg/dL — ABNORMAL HIGH (ref 70–99)
Potassium: 5.6 mmol/L — ABNORMAL HIGH (ref 3.5–5.1)
Sodium: 142 mmol/L (ref 135–145)
Total Bilirubin: 0.3 mg/dL (ref 0.3–1.2)
Total Protein: 7.2 g/dL (ref 6.5–8.1)

## 2019-06-17 LAB — GLUCOSE, CAPILLARY
Glucose-Capillary: 100 mg/dL — ABNORMAL HIGH (ref 70–99)
Glucose-Capillary: 113 mg/dL — ABNORMAL HIGH (ref 70–99)

## 2019-06-17 LAB — AMMONIA: Ammonia: 20 umol/L (ref 9–35)

## 2019-06-17 LAB — PHOSPHORUS: Phosphorus: 4.3 mg/dL (ref 2.5–4.6)

## 2019-06-17 LAB — MAGNESIUM: Magnesium: 1.8 mg/dL (ref 1.7–2.4)

## 2019-06-17 MED ORDER — SODIUM CHLORIDE 0.9 % IV SOLN: INTRAVENOUS | Status: DC

## 2019-06-17 MED ORDER — SODIUM ZIRCONIUM CYCLOSILICATE 10 G PO PACK
10.0000 g | PACK | Freq: Once | ORAL | Status: AC
  Administered 2019-06-17: 10 g via ORAL
  Filled 2019-06-17: qty 1

## 2019-06-17 NOTE — Progress Notes (Signed)
CRITICAL VALUE ALERT  Critical Value: CO2: 95.2  Date & Time Notied: 06/17/19 1500  Provider Notified: Alfredia Ferguson, MD   Orders Received/Actions taken: Order to transfer to progressive.

## 2019-06-17 NOTE — Consult Note (Signed)
Neurology Consultation  Reason for Consult: Abnormal twitching movements Referring Physician: Dr. Osborne Oman  CC: Abnormal twitching movements  History is obtained from: Patient, chart, hospitalist at the bedside.  HPI: Kara Vincent is a 54 y.o. female who has a past medical history significant for CHF, chronic kidney disease, hyperlipidemia, hypertension, morbid obesity noncompliant with CPAP, asthma, anxiety, allergies, and chronic pain syndrome, who was admitted for management of multiple medical issues including cellulitis of the right leg, acute on chronic respiratory failure and hypoxia, possible CHF exacerbation versus pneumonia and also noted to have bedbugs acquired at a motel where she is staying secondary to being homeless, started to have some abnormal twitching of her body this morning. According to the care team, she was prescribed permethrin for the bedbugs and that was applied to her last night.  This afternoon, it was noted that she is having some intermittent twitching of her arms and also face.  The hospitalist was at bedside and examined the patient and call neurology for consultation because these movements are new.  She had her common-law partner, who introduced himself as Hypnotic being his nickname, at the bedside too, who confirmed that she has never had seizures. She is noncompliant to CPAP although one has been ordered in the hospital. She has some shortness of breath.  No chest discomfort.  No fevers or chills.  Her medication list includes Atarax, Tylenol, allopurinol, albuterol, clonidine, Enablex, ceftriaxone in the hospital, Cymbalta, Robitussin, Zofran, Percocet, Protonix.  Of note - no facial twitching was noted during the encounter.  ROS:ROS was performed and is negative except as noted in the HPI.    Past Medical History:  Diagnosis Date  . Allergy   . Anxiety   . Arthritis   . Asthma   . Asthma   . CHF (congestive heart failure) ([place])   . CHF  (congestive heart failure) ([place])   . Chronic abdominal pain   . Chronic kidney disease   . Chronic pain    "all over"  . 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 GERD (gastroesophageal reflux disease)   . Gout   . Hidradenitis   . HLD (hyperlipidemia) 12/14/2015  . Homelessness   . Hyperlipemia   . Hypertension   . Low back pain   . Morbid obesity ([place])    uses motor wheel chair  . MRSA (methicillin resistant Staphylococcus aureus)    states about a year ago  . On home O2    3L N/C O2 continuously  . OSA (obstructive sleep apnea)    non-compliant with CPAP  . Oxygen deficiency   . Renal insufficiency   . Sleep apnea     Family History  Problem Relation Age of Onset  . Asthma Mother   . Diabetes Father   . Stroke Father   . Heart disease Father   . Arthritis Sister   . Asthma Sister   . Asthma Daughter   . Asthma Son   . Arthritis Sister   . Asthma Sister    Social History:   reports that she quit smoking about 16 years ago. Her smoking use included cigarettes. She quit smokeless tobacco use about 41 years ago. She reports current alcohol use. She reports current drug use. Drugs: "Crack" cocaine and Other-see comments.   Medications  Current Facility-Administered Medications:  .  acetaminophen (TYLENOL) tablet 650 mg, 650 mg, Oral, Q6H PRN **OR** acetaminophen (TYLENOL) suppository 650 mg, 650 mg, Rectal, Q6H  PRN, [name], MD .  acetaminophen-codeine (TYLENOL #3) 300-30 MG per tablet 1 tablet, 1 tablet, Oral, Q8H PRN, [name], MD, 1 tablet at 06/16/19 2109 .  albuterol (PROVENTIL) (2.5 MG/3ML) 0.083% nebulizer solution 2.5 mg, 2.5 mg, Inhalation, Q4H PRN, Caren Griffins, MD, 2.5 mg at 06/15/19 2046 .  allopurinol (ZYLOPRIM) tablet 100 mg, 100 mg, Oral, Daily, [name], MD, 100 mg at 06/17/19 0854 .  bisacodyl (DULCOLAX) suppository 10 mg, 10 mg, Rectal, Daily PRN, Raiford Noble Latif, DO, 10 mg at 06/17/19 0117 .   cefTRIAXone (ROCEPHIN) 1 g in sodium chloride 0.9 % 100 mL IVPB, 1 g, Intravenous, Q24H, [name], MD, Last Rate: 200 mL/hr at 06/16/19 2000, 1 g at 06/16/19 2000 .  cloNIDine (CATAPRES) tablet 0.2 mg, 0.2 mg, Oral, BID, [name], MD, 0.2 mg at 06/17/19 0852 .  darifenacin (ENABLEX) 24 hr tablet 7.5 mg, 7.5 mg, Oral, Daily, [name], MD, 7.5 mg at 06/17/19 0853 .  DULoxetine (CYMBALTA) DR capsule 30 mg, 30 mg, Oral, Daily, [name], MD, 30 mg at 06/17/19 1191 .  enoxaparin (LOVENOX) injection 90 mg, 90 mg, Subcutaneous, Q24H, Skeet Simmer, [facility] .  guaiFENesin-dextromethorphan (ROBITUSSIN DM) 100-10 MG/5ML syrup 5 mL, 5 mL, Oral, Q4H PRN, Sheikh, Omair Latif, DO, 5 mL at 06/15/19 2249 .  hydrOXYzine (ATARAX/VISTARIL) tablet 50 mg, 50 mg, Oral, Q8H PRN, [name], MD, 50 mg at 06/17/19 0859 .  isosorbide mononitrate (IMDUR) 24 hr tablet 30 mg, 30 mg, Oral, Daily, [name], MD, 30 mg at 06/17/19 4782 .  lisinopril (ZESTRIL) tablet 40 mg, 40 mg, Oral, Daily, [name], MD, 40 mg at 06/17/19 9562 .  MEDLINE mouth rinse, 15 mL, Mouth Rinse, BID, Caren Griffins, MD, 15 mL at 06/16/19 2113 .  omega-3 acid ethyl esters (LOVAZA) capsule 1 g, 1 g, Oral, Daily, [name], MD, 1 g at 06/17/19 0853 .  ondansetron (ZOFRAN) injection 4 mg, 4 mg, Intravenous, Q6H PRN, Raiford Noble Latif, DO, 4 mg at 06/16/19 1829 .  oxyCODONE-acetaminophen (PERCOCET/ROXICET) 5-325 MG per tablet 2 tablet, 2 tablet, Oral, Q6H PRN, Caren Griffins, MD, 2 tablet at 06/17/19 0850 .  pantoprazole (PROTONIX) EC tablet 40 mg, 40 mg, Oral, Daily, [name], MD, 40 mg at 06/17/19 0853 .  polyethylene glycol (MIRALAX / GLYCOLAX) packet 17 g, 17 g, Oral, BID, Raiford Noble [place], DO, 17 g at 06/17/19 0851 .  senna-docusate (Senokot-S) tablet 1 tablet, 1 tablet, Oral, BID, Raiford Noble [place], [place], 1 tablet at 06/17/19 [phone] .   triamcinolone cream (KENALOG) 0.1 %, , Topical, BID, Caren Griffins, MD, Given at 06/17/19 [phone] .  vitamin E capsule 100 Units, 100 Units, Oral, Daily, [name], MD, 100 Units at 06/17/19 4696  Exam: Current vital signs: BP (!) 102/56 (BP Location: Left Wrist)   Pulse 80   Temp 98.1 F (36.7 C) (Oral)   Resp 18   Ht 5\' 6"  (1.676 m)   Wt (!) 179.2 kg   LMP 03/24/2009   SpO2 94%   BMI 63.77 kg/m  Vital signs in last 24 hours: Temp:  [97.7 F (36.5 C)-98.3 F (36.8 C)] 98.1 F (36.7 C) (03/26 0959) Pulse Rate:  [80-91] 80 (03/26 0959) Resp:  [18-21] 18 (03/26 0959) BP: (102-144)/(56-84) 102/56 (03/26 0959) SpO2:  [91 %-98 %] 94 % (03/26 1001) Weight:  [179.2 kg] 179.2 kg (03/25 2129) General: Sitting  in bed trying to eat her food appears very uncomfortable. HEENT: Normocephalic/atraumatic/dry oral mucous membranes CVS: Regular rate rhythm Respiratory: Saturating well on nasal cannula Extremities: Right lower extremity has erythema and is warm.  Multiple lesions on both legs and arms Neurological exam Sitting in bed trying to eat her food but appears uncomfortable.  Appears drowsy but follows commands. Speech is mildly dysarthric No evidence of aphasia but extremely poor attention concentration which makes the exam challenging. Cranial nerves: Pupils equal round react light, extract movements intact, visual fields full, face appears symmetric, tongue and palate midline. Motor exam: Both upper extremities are antigravity but she has asterixis in outstretched hands bilaterally.  She also lets go of her arms completely down to touch her sides and does not give full effort.  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 Sensory exam: Intact light touch Coordination: Difficult to perform with her mentation Gait testing deferred due to safety.  Labs I have reviewed labs in epic and the results  pertinent to this consultation are:  CBC    Component Value Date/Time   WBC 13.1 (H) 06/17/2019 0531   RBC 3.61 (L) 06/17/2019 0531   HGB 10.6 (L) 06/17/2019 0531   HGB 11.2 01/24/2019 1646   HCT 37.5 06/17/2019 0531   HCT 35.2 01/24/2019 1646   PLT 365 06/17/2019 0531   PLT 226 01/24/2019 1646   MCV 103.9 (H) 06/17/2019 0531   MCV 94 01/24/2019 1646   MCH 29.4 06/17/2019 0531   MCHC 28.3 (L) 06/17/2019 0531   RDW 14.3 06/17/2019 0531   RDW 12.8 01/24/2019 1646   LYMPHSABS 1.5 06/17/2019 0531   MONOABS 0.8 06/17/2019 0531   EOSABS 0.2 06/17/2019 0531   BASOSABS 0.1 06/17/2019 0531    CMP     Component Value Date/Time   NA 142 06/17/2019 0531   NA 144 01/24/2019 1646   K 5.6 (H) 06/17/2019 0531   CL 95 (L) 06/17/2019 0531   CO2 36 (H) 06/17/2019 0531   GLUCOSE 111 (H) 06/17/2019 0531   BUN 27 (H) 06/17/2019 0531   BUN 23 01/24/2019 1646   CREATININE 1.66 (H) 06/17/2019 0531   CALCIUM 8.8 (L) 06/17/2019 0531   PROT 7.2 06/17/2019 0531   PROT 6.6 01/24/2019 1646   ALBUMIN 3.0 (L) 06/17/2019 0531   ALBUMIN 4.3 01/24/2019 1646   AST 10 (L) 06/17/2019 0531   ALT 14 06/17/2019 0531   ALKPHOS 64 06/17/2019 0531   BILITOT 0.3 06/17/2019 0531   BILITOT 0.2 01/24/2019 1646   GFRNONAA 34 (L) 06/17/2019 0531   GFRAA 40 (L) 06/17/2019 0531   Imaging I have reviewed the images obtained:  CT-scan of the brain from 2019 reviewed.  No acute changes.  Chest x-ray 3/22 Limited exam due to shallow inspiration, mild ill-defined opacity within lateral right lung base-atelectasis versus pneumonia.  Assessment: 54 year old woman with above past medical history, being treated for bedbugs with permethrin in addition to his right leg cellulitis, possible CHF exacerbation noted to have onset of twitching movements since this afternoon. The only addition of medication yesterday was permethrin and the question was whether these are in any way related to permethrin. In my literature  search, permethrin-induced neurotoxicity was caused mostly by ingestion and not by topical application. Examination does reveal some asterixis on outstretched hands although I cannot confidently rule out psychogenic component but given her multiple comorbidities, I would explore this further. I  have low suspicion for this being seizure Given her body habitus, chronic hypoxia, current systemic illness-hypercarbia could also explain her mental status as well as these twitching movements.  Impression: Evaluate for hypercarbia versus deranged liver function leading to asterixis Evaluate for underlying seizures-low suspicion  Recommendations: -Check an EEG stat to r/o underlying electrographic abnormalities. -Reduce sedating medications as much as possible. I would d/c atarax.  -Use benzo for anxiety as needed. -Check ammonia, ABG -Head CT Will follow results  -- Amie Portland, MD [facility] Pager: [phone] If 7pm to 7am, please call on call as listed on AMION.

## 2019-06-17 NOTE — Procedures (Addendum)
Patient Name: [name]  MRN: [id number]  Epilepsy Attending: Lora Havens  Referring Physician/Provider: Dr. Osborne Oman Date: 06/17/2019 Duration: 21.55 mins  Patient history: 54 year old female with jerking movements in arms.  EEG to evaluate for seizures.  Level of alertness: Awake and asleep  AEDs during EEG study: None  Technical aspects: This EEG study was done with scalp electrodes positioned according to the 10-20 International system of electrode placement. Electrical activity was acquired at a sampling rate of 500Hz  and reviewed with a high frequency filter of 70Hz  and a low frequency filter of 1Hz . EEG data were recorded continuously and digitally stored.   Description: The posterior dominant rhythm consists of 8 Hz activity of moderate voltage (25-35 uV) seen predominantly in posterior head regions, symmetric and reactive to eye opening and eye closing. Sleep was characterized by generalized 4-6hz  theta-delta slowing. Hyperventilation and photic stimulation were not performed.  IMPRESSION: This study is within normal limits. No seizures or epileptiform discharges were seen throughout the recording.  Brantleigh Mifflin Barbra Sarks

## 2019-06-17 NOTE — Progress Notes (Signed)
Patient currently on EEG at this time. RT will place patient on bipap upon arrival to 2W.

## 2019-06-17 NOTE — Progress Notes (Addendum)
Arterial blood gas pH 7.2, PCO2 95.2, PO2 79.6, bicarb 38.2.  Hypercarbia likely the culprit for current presentation of twitching-likely asterixis and depressed mental status-likely CO2 narcosis.  We will follow her CT head and EEG along with labs with you.  Relayed plan to Dr. Alfredia Ferguson  -- Amie Portland, MD [facility] Pager: [phone] If 7pm to 7am, please call on call as listed on AMION.

## 2019-06-17 NOTE — Significant Event (Signed)
Rapid Response Event Note  Overview: Critical ABG  Initial Focused Assessment: Made aware by RT and 2W CRN that patient's ABG is 7.225/95.2/79.6/38 -- on 4L [place]. Nurse had reported the ABG to the MD, orders were placed for BIPAP and transfer to PCU. Staff were concerned that since the patient had been refusing CPAP that she would refuse BIPAP as well.   Upon arrival, patient was alert and was able to answer my questions, she was able to move all extremities, EEG Techs were placing her on EEG currently. I explained the patient the importance of compliance with NIV and how it was crucial that she try to wear BIPAP as instructed by staff. Per nurse, patient had "jerking like" movement earlier. VS: BP 95/44, HR 71, RR normal, normal respiratory effort, 94% on 4L [place].   Currently she is not in distress.  Interventions: -- NO RRT INTERVENTIONS  Plan of Care: -- Staff to transfer patient to PCU when EEG tech is finished (tech had already started applying leads and adhesive, I did not want her to start over - patient is currently awake and not in acute). I relayed this to the Neuro MD and TRH MD as well, both were okay with the EEG tech completing the study and then moving the patient to the PCU. -- Place on BIPAP upon arrival to PCU -- Keep on BIPAP until ABG improves -- Monitor mental status closely -- should patient be on BIPAP and have increased somnolence - notify RT, RRRN, and provider on call immediately -- Might need ICU level of care - depending on patient's clinicial presentation and/or repeat ABGs. Currently okay for patient to come to PCU  Event Summary:  Call Time [place]  Kara Vincent R

## 2019-06-17 NOTE — Progress Notes (Signed)
Educated patient on the importance of using Bipap while asleep and during naps.

## 2019-06-17 NOTE — Plan of Care (Signed)
EEG normal. CTH pending -  Call if any abnormality Neurology will be available as needed.  Twitching likely asterixis from metabolic derangements and hypercarbia.   Amie Portland, MD Neurology [phone] (pager)

## 2019-06-17 NOTE — Progress Notes (Signed)
Patient is complaining of jerking movements in her arms that is new. MD notified and made aware. Orders placed for EEG. Will continue to monitor.

## 2019-06-17 NOTE — Progress Notes (Signed)
PROGRESS NOTE    Kara Vincent  QPY:[id number] DOB: 1965/08/22 DOA: 06/13/2019 PCP: Ladell Pier, MD  Brief Narrative: HPI per Dr. Gean Birchwood on 06/14/19 Kara Vincent is a 54 y.o. female with care morbid obesity asthma chronic kidney disease stage III hypertension gout presents to the ER because of increasing pain and swelling of the both lower extremities more on the right side.  This has been ongoing for last 1 week.  Patient states she also got more short of breath than usual.  Has some pleuritic type of chest pain denies any productive cough or fever or chills.  Patient in addition also has been having some frontal headache with no focal deficits.  Headache has been present for last few hours.  ED Course: In the ER on exam patient has bilateral lower extremity erythema and swelling of both legs more on the right side.  No signs of any compartment syndrome.  The swelling extends from the ankle up to the mid calf.  Patient also was mildly hypoxic usually uses chronic oxygen at home.  Has not been using her CPAP recently because she lost it.  Chest x-ray shows nonspecific findings for possible opacity.  CT angiogram was unremarkable.  Labs show creatinine 1.8 BNP 120 lactic acid 1.8 hemoglobin 10.6 WBC 11.4 x-ray of the both left-side and right side shows edema.  EKG shows normal sinus rhythm with low voltage.  **Interim History Cellulitis is improving somewhat.  Patient complaining some nausea now.  Still wearing oxygen but wears 3 L at baseline.  Have ordered her CPAP for the night but she is refused.  She apparently has bedbugs so we will give her permethrin 5% cream apply topically from the neck down.  Cellulitis appears to be improving.  Patient was little somnolent but easily arousable and states that she is feeling a little bit better yesterday but she is more somnolent again today and a little worse.  She started having some tremors and some twitching.  Neurology was  consulted and an ABG was ordered and she was found to have acute respiratory failure with hypercarbia and hypoxia and she was transferred to the stepdown unit for BiPAP given her PCO2 of 95.  Neurology worked the patient up for twitching but thinks it is secondary to her hypercarbia  Assessment & Plan:   Principal Problem:   Cellulitis of both lower extremities Active Problems:   Asthma exacerbation   CKD (chronic kidney disease) stage 3, GFR 30-59 ml/min (HCC)   OSA (obstructive sleep apnea)   Essential hypertension   Gout   Morbid obesity ([place])   Acute respiratory failure with hypoxia (HCC)   Cellulitis  Cellulitis of the Right Lower Extremity, improving -for which patient is on empiric antibiotics with IV Ceftriaxone.  And will continue -Will check Dopplers to rule out DVT and showed "No evidence of deep vein thrombosis seen in the lower extremities, bilaterally." -No signs of any compartment syndrome. -WBC has been hovering around 11 and 12 but today is 13.1 -Lactic acid level was 0.7 -Continue monitor temperature curve and continue monitor clinical/letter intervention  Acute on chronic respiratory failure hypoxia and now hypercarbia could be from asthma versus possible fluid likely in the setting of ? Diastolic CHF  OSA and obesity hypoventilation syndrome.   -BNP was mildly elevated at 120.9 -CT angio was without PE -She wears 3 L of supplemental oxygen at home; now requiring BiPAP given that she had a pH of 1.225, PCO2 of  95.2, PO2 of 79.6, bicarbonate level 30.2, and an ABG O2 saturation of 94.3% on 36% FiO2 -We will need to blow off her CO2 -Patient states she takes Lasix 40 mg twice daily and this was continued but we will stop given her worsening renal function and hyperkalemia for now -This is likely multifactorial probably due to OSA and obesity hypoventilation syndrome -Closely monitor. -Continue CPAP at night and ensure compliance -We will need an ambulatory home O2  screen prior to discharge  Twitching and myoclonic jerking -Neurology consulted  -Levels 20 -Neurology feels this is secondary to hypercarbia but they are ruling out other etiologies next-head CT was ordered as well as an EEG -Permethrin cream has been stopped and will reduce her hydroxyzine  Hypertension  -Continue with clonidine, Imdur, and lisinopril for now  Chronic kidney disease stage IIIb improving Hyperphosphatemia -Creatinine is appear to be baseline but however slightly worsened today given Lasix usage now 27/1.66 -Patient's phosphorus level was 4.3 -Closely monitor closely since patient is on Lasix and lisinopril.;  Will hold her Lasix and hold her lisinopril given her hyperkalemia -Avoid further nephrotoxic medications, contrast dyes, hypotension and renally dose medications -We will start gentle IV fluid hydration at 50 cc/h -Repeat CMP in a.m. 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 -Patient's hemoglobin/hematocrit is stable at 10.6/37.5 -Check anemia panel in the a.m. -Continue to monitor for signs and symptoms of bleeding; currently no overt bleeding noted -Repeat CBC in a.m.  Hyperkalemia -Mild as patient's potassium is 5.6 -Given a dose of Lokelma 10 mg x1 day before yesterday -Continue to monitor and trend -We will stop the Lasix and hold her lisinopril and start gentle IV fluid hydration and give a dose of Lokelma again  History of Gout  -C/w Allopurinol.  History of Sleep Apnea  -has not been compliant due to patient recently lost her CPAP.   -CPAP at bedtime ordered and she refused and she was hypercarbic this morning  -We will place on BiPAP  -?PNA -Mild il-defined opacity on the left side of the lung field on chest x-ray.   -Repeat chest x-ray in the a.m. -Continue with albuterol 2.5 mg IH every 4 hours as needed for wheezing or shortness of breath -Continue  with BiPAP  HLD -C/w omega-3 acid ethyl esters  GERD -Continue with Pantoprazole 40 p.o. daily  Super Morbid Obesity -Estimated body mass index is 63.77 kg/m as calculated from the following:   Height as of this encounter: 5\' 6"  (1.676 m).   Weight as of this encounter: 179.2 kg. -Weight Loss and Dietary Counseling given   Chronic Pain -Continue with Tylenol 3 p.o. every 8 hours and Percocet 2 tabs p.o. every 6 as needed -avoid further narcotic medications given somnolence  Constipation -DG Abd showed "Air distention of nondescript bowel loop within the right hemiabdomen, nonspecific. Large stool burden within the right colon and distal colon/rectum." -Start the patient on adequate bowel regimen and try suppository  Bedbugs -Recently exposed in a motel that had bedbugs with mattress from infested -Recommend washing all belongings in extremely hot water if cannot wash belongings and place him in a trash bag and leave him for 1 week -We will apply permethrin cream 5% topically from the neck down overnight and repeat in 1 week -Continue with contact precautions   DVT prophylaxis: Enoxaparin 40 mg sq q24h Code Status: FULL CODE Family Communication: Discussed with significant other at  bedside Disposition Plan: Pending further clinical improvement of her cellulitis and nausea.  Patient is from home and requiring IV antibiotics for her cellulitis  Consultants:   None  Procedures:  LE VENOUS DUPLEX BILATERAL:  - No evidence of deep vein thrombosis seen in the lower extremities,  bilaterally.   Antimicrobials:  Anti-infectives (From admission, onward)   Start     Dose/Rate Route Frequency Ordered Stop   06/14/19 2000  cefTRIAXone (ROCEPHIN) 1 g in sodium chloride 0.9 % 100 mL IVPB     1 g 200 mL/hr over 30 Minutes Intravenous Every 24 hours 06/14/19 0756     06/14/19 0430  vancomycin (VANCOREADY) IVPB 2000 mg/400 mL     2,000 mg 200 mL/hr over 120 Minutes Intravenous   Once 06/14/19 0430 06/14/19 0700   06/14/19 0400  cefTRIAXone (ROCEPHIN) 1 g in sodium chloride 0.9 % 100 mL IVPB     1 g 200 mL/hr over 30 Minutes Intravenous  Once 06/14/19 0346 06/14/19 0450     Subjective: Seen and examined at bedside and she was little bit more somnolent this morning.  She woke up and started having some twitching and some jerking.  Neurology was consulted and she was worked up and was found to be hypercarbic and transferred to the progressive care unit.  She continues to have some abdominal pain.  No nausea or vomiting..  Sleepy.  No other concerns or questions this time.  Objective: Vitals:   06/17/19 0959 06/17/19 1001 06/17/19 1625 06/17/19 1804  BP: (!) 102/56  (!) 95/44 94/75  Pulse: 80  71   Resp: 18  16   Temp: 98.1 F (36.7 C)  97.7 F (36.5 C) 97.8 F (36.6 C)  TempSrc: Oral  Oral Axillary  SpO2: 91% 94% 94%   Weight:      Height:        Intake/Output Summary (Last 24 hours) at 06/17/2019 2014 Last data filed at 06/17/2019 0820 Gross per 24 hour  Intake 720 ml  Output 300 ml  Net 420 ml   Filed Weights   06/14/19 1400 06/16/19 2129  Weight: (!) 182.8 kg (!) 179.2 kg   Examination: Physical Exam:  Constitutional: WN/WD occasion female currently who is sleepy and somnolent and easily arousable but now started having some twitching when she wakes up,  Eyes: Lids and conjunctivae normal, sclerae anicteric  ENMT: External Ears, Nose appear normal. Grossly normal hearing.  Neck: Appears normal, supple, no cervical masses, normal ROM, no appreciable thyromegaly; no JVD Respiratory: Diminished to auscultation bilaterally with coarse breath sounds, no wheezing, rales, rhonchi or crackles. Normal respiratory effort and patient is not tachypenic. No accessory muscle use.  Wearing 3 L of supplemental oxygen via nasal cannula Cardiovascular: RRR, no murmurs / rubs / gallops. S1 and S2 auscultated.  1+ lower extremity edema Abdomen: Soft, non-tender,  distended secondary body habitus.  Bowel sounds positive.  GU: Deferred. Musculoskeletal: No clubbing / cyanosis of digits/nails. No joint deformity upper and lower extremities.  Skin: Right leg cellulitis is improving but she has multiple insect bites from the bedbugs that she experienced diffusely scattered throughout her body on her arms and legs Neurologic: CN 2-12 grossly intact with no focal deficits.  Has some asterixis and some twitching and some myoclonic jerking of her left arm. Psychiatric: Normal judgment and insight.  Not as alert but she is and oriented x 3.  Somnolent mood and appropriate affect.   Data Reviewed: I have personally reviewed following  labs and imaging studies  CBC: Recent Labs  Lab 06/13/19 1901 06/14/19 0615 06/15/19 0950 06/16/19 0326 06/17/19 0531  WBC 11.4* 12.4* 11.1* 12.4* 13.1*  NEUTROABS  --   --  9.0* 10.0* 10.4*  HGB 10.6* 9.6* 11.1* 11.3* 10.6*  HCT 36.0 33.7* 38.4 39.0 37.5  MCV 99.7 101.2* 100.5* 100.3* 103.9*  PLT 267 233 312 350 720   Basic Metabolic Panel: Recent Labs  Lab 06/13/19 1901 06/14/19 0615 06/15/19 0950 06/16/19 0326 06/17/19 0531  NA 140  --  142 141 142  K 5.0  --  5.3* 4.9 5.6*  CL 103  --  100 98 95*  CO2 28  --  32 34* 36*  GLUCOSE 134*  --  112* 125* 111*  BUN 36*  --  21* 22* 27*  CREATININE 1.85* 1.68* 1.54* 1.40* 1.66*  CALCIUM 8.4*  --  8.9 8.7* 8.8*  MG  --   --  1.9 1.7 1.8  PHOS  --   --  4.9* 4.8* 4.3   GFR: Estimated Creatinine Clearance: 64.9 mL/min (A) (by C-G formula based on SCr of 1.66 mg/dL (H)). Liver Function Tests: Recent Labs  Lab 06/13/19 1901 06/15/19 0950 06/16/19 0326 06/17/19 0531  AST 18 12* 13* 10*  ALT 14 15 15 14   ALKPHOS 66 70 72 64  BILITOT 0.5 0.5 0.5 0.3  PROT 7.1 7.3 7.5 7.2  ALBUMIN 3.1* 3.0* 3.0* 3.0*   No results for input(s): LIPASE, AMYLASE in the last 168 hours. Recent Labs  Lab 06/17/19 1441  AMMONIA 20   Coagulation Profile: No results for input(s):  INR, PROTIME in the last 168 hours. Cardiac Enzymes: No results for input(s): CKTOTAL, CKMB, CKMBINDEX, TROPONINI in the last 168 hours. BNP (last 3 results) No results for input(s): PROBNP in the last 8760 hours. HbA1C: No results for input(s): HGBA1C in the last 72 hours. CBG: Recent Labs  Lab 06/17/19 0643 06/17/19 1145  GLUCAP 100* 113*   Lipid Profile: No results for input(s): CHOL, HDL, LDLCALC, TRIG, CHOLHDL, LDLDIRECT in the last 72 hours. Thyroid Function Tests: No results for input(s): TSH, T4TOTAL, FREET4, T3FREE, THYROIDAB in the last 72 hours. Anemia Panel: No results for input(s): VITAMINB12, FOLATE, FERRITIN, TIBC, IRON, RETICCTPCT in the last 72 hours. Sepsis Labs: Recent Labs  Lab 06/13/19 1902 06/14/19 0044  LATICACIDVEN 0.8 0.7    Recent Results (from the past 240 hour(s))  SARS CORONAVIRUS 2 (TAT 6-24 HRS) Nasopharyngeal Nasopharyngeal Swab     Status: None   Collection Time: 06/14/19  5:15 AM   Specimen: Nasopharyngeal Swab  Result Value Ref Range Status   SARS Coronavirus 2 NEGATIVE NEGATIVE Final    Comment: (NOTE) SARS-CoV-2 target nucleic acids are NOT DETECTED. The SARS-CoV-2 RNA is generally detectable in upper and lower respiratory specimens during the acute phase of infection. Negative results do not preclude SARS-CoV-2 infection, do not rule out co-infections with other pathogens, and should not be used as the sole basis for treatment or other patient management decisions. Negative results must be combined with clinical observations, patient history, and epidemiological information. The expected result is Negative. Fact Sheet for Patients: SugarRoll.be Fact Sheet for Healthcare Providers: https://www.woods-mathews.com/ This test is not yet approved or cleared by the [place] FDA and  has been authorized for detection and/or diagnosis of SARS-CoV-2 by FDA under an Emergency Use Authorization  (EUA). This EUA will remain  in effect (meaning this test can be used) for the duration of the COVID-19  declaration under Section 56 4(b)(1) of the Act, 21 U.S.C. section 360bbb-3(b)(1), unless the authorization is terminated or revoked sooner. Performed at [facility], [place] [address], [place], [place] [address]   MRSA PCR Screening     Status: None   Collection Time: 06/14/19  4:25 PM   Specimen: Nasal Mucosa; Nasopharyngeal  Result Value Ref Range Status   MRSA by PCR NEGATIVE NEGATIVE Final    Comment:        The GeneXpert MRSA Assay (FDA approved for NASAL specimens only), is one component of a comprehensive MRSA colonization surveillance program. It is not intended to diagnose MRSA infection nor to guide or monitor treatment for MRSA infections. Performed at [facility], [place] [address] [place], [place] [address]      RN Pressure Injury Documentation:     Estimated body mass index is 63.77 kg/m as calculated from the following:   Height as of this encounter: 5\' 6"  (1.676 m).   Weight as of this encounter: 179.2 kg.  Malnutrition Type:      Malnutrition Characteristics:      Nutrition Interventions:     Radiology Studies: DG Abd 1 View  Result Date: 06/16/2019 CLINICAL DATA:  Abdominal pain with nausea and headaches. EXAM: ABDOMEN - 1 VIEW COMPARISON:  Abdominal radiographs CT abdomen/pelvis 11/28/2011. 12/27/2011 FINDINGS: Air distention of a nondescript bowel loop within the right hemiabdomen. Large colonic stool burden within the right colon and distal colon/rectum. No acute bony abnormality. Thoracic spondylosis. IMPRESSION: Air distention of nondescript bowel loop within the right hemiabdomen, nonspecific. Large stool burden within the right colon and distal colon/rectum. Electronically Signed   By: Kellie Simmering DO   On: 06/16/2019 09:08   EEG adult  Result Date: 06/17/2019 Lora Havens, MD     06/17/2019  5:12 PM Patient Name: Kara Vincent MRN: [id number] Epilepsy Attending: Lora Havens Referring Physician/Provider: Dr. Osborne Oman Date: 06/17/2019 Duration: 21.55 mins Patient history: 54 year old female with jerking movements in arms.  EEG to evaluate for seizures. Level of alertness: Awake and asleep AEDs during EEG study: None Technical aspects: This EEG study was done with scalp electrodes positioned according to the 10-20 International system of electrode placement. Electrical activity was acquired at a sampling rate of 500Hz  and reviewed with a high frequency filter of 70Hz  and a low frequency filter of 1Hz . EEG data were recorded continuously and digitally stored. Description: The posterior dominant rhythm consists of 8 Hz activity of moderate voltage (25-35 uV) seen predominantly in posterior head regions, symmetric and reactive to eye opening and eye closing. Sleep was characterized by generalized 4-6hz  theta-delta slowing. Hyperventilation and photic stimulation were not performed. IMPRESSION: This study is within normal limits. No seizures or epileptiform discharges were seen throughout the recording. Priyanka Barbra Sarks   Scheduled Meds: . allopurinol  100 mg Oral Daily  . cloNIDine  0.2 mg Oral BID  . darifenacin  7.5 mg Oral Daily  . DULoxetine  30 mg Oral Daily  . enoxaparin (LOVENOX) injection  90 mg Subcutaneous Q24H  . isosorbide mononitrate  30 mg Oral Daily  . lisinopril  40 mg Oral Daily  . mouth rinse  15 mL Mouth Rinse BID  . omega-3 acid ethyl esters  1 g Oral Daily  . pantoprazole  40 mg Oral Daily  . polyethylene glycol  17 g Oral BID  . senna-docusate  1 tablet Oral BID  . triamcinolone cream   Topical BID  .  vitamin E  100 Units Oral Daily   Continuous Infusions: . cefTRIAXone (ROCEPHIN)  IV 1 g (06/16/19 2000)    LOS: 3 days   Kerney Elbe, DO [facility] PAGER is on [place]  If 7PM-7AM, please contact night-coverage [email or web address]

## 2019-06-18 ENCOUNTER — Inpatient Hospital Stay (HOSPITAL_COMMUNITY): Payer: Medicaid Other

## 2019-06-18 DIAGNOSIS — L03116 Cellulitis of left lower limb: Secondary | ICD-10-CM

## 2019-06-18 DIAGNOSIS — L03115 Cellulitis of right lower limb: Principal | ICD-10-CM

## 2019-06-18 DIAGNOSIS — J9601 Acute respiratory failure with hypoxia: Secondary | ICD-10-CM

## 2019-06-18 DIAGNOSIS — N189 Chronic kidney disease, unspecified: Secondary | ICD-10-CM

## 2019-06-18 DIAGNOSIS — N179 Acute kidney failure, unspecified: Secondary | ICD-10-CM

## 2019-06-18 LAB — BLOOD GAS, ARTERIAL
Acid-Base Excess: 11.9 mmol/L — ABNORMAL HIGH (ref 0.0–2.0)
Acid-Base Excess: 12.5 mmol/L — ABNORMAL HIGH (ref 0.0–2.0)
Bicarbonate: 39.8 mmol/L — ABNORMAL HIGH (ref 20.0–28.0)
Bicarbonate: 39.4 mmol/L — ABNORMAL HIGH (ref 20.0–28.0)
FIO2: 70
FIO2: 40
O2 Saturation: 98.4 %
O2 Saturation: 95.4 %
Patient temperature: 37
Patient temperature: 36.1
pCO2 arterial: 103 mmHg (ref 32.0–48.0)
pCO2 arterial: 81 mmHg (ref 32.0–48.0)
pH, Arterial: 7.213 — ABNORMAL LOW (ref 7.350–7.450)
pH, Arterial: 7.302 — ABNORMAL LOW (ref 7.350–7.450)
pO2, Arterial: 172 mmHg — ABNORMAL HIGH (ref 83.0–108.0)
pO2, Arterial: 74.8 mmHg — ABNORMAL LOW (ref 83.0–108.0)

## 2019-06-18 LAB — CBC WITH DIFFERENTIAL/PLATELET
Abs Immature Granulocytes: 0.08 10*3/uL — ABNORMAL HIGH (ref 0.00–0.07)
Basophils Absolute: 0.1 10*3/uL (ref 0.0–0.1)
Basophils Relative: 1 %
Eosinophils Absolute: 0.3 10*3/uL (ref 0.0–0.5)
Eosinophils Relative: 3 %
HCT: 34.7 % — ABNORMAL LOW (ref 36.0–46.0)
Hemoglobin: 9.8 g/dL — ABNORMAL LOW (ref 12.0–15.0)
Immature Granulocytes: 1 %
Lymphocytes Relative: 16 %
Lymphs Abs: 1.6 10*3/uL (ref 0.7–4.0)
MCH: 29.1 pg (ref 26.0–34.0)
MCHC: 28.2 g/dL — ABNORMAL LOW (ref 30.0–36.0)
MCV: 103 fL — ABNORMAL HIGH (ref 80.0–100.0)
Monocytes Absolute: 0.6 10*3/uL (ref 0.1–1.0)
Monocytes Relative: 6 %
Neutro Abs: 7.3 10*3/uL (ref 1.7–7.7)
Neutrophils Relative %: 73 %
Platelets: 323 10*3/uL (ref 150–400)
RBC: 3.37 MIL/uL — ABNORMAL LOW (ref 3.87–5.11)
RDW: 14.4 % (ref 11.5–15.5)
WBC: 9.9 10*3/uL (ref 4.0–10.5)
nRBC: 0.2 % (ref 0.0–0.2)

## 2019-06-18 LAB — COMPREHENSIVE METABOLIC PANEL
ALT: 12 U/L (ref 0–44)
AST: 9 U/L — ABNORMAL LOW (ref 15–41)
Albumin: 2.7 g/dL — ABNORMAL LOW (ref 3.5–5.0)
Alkaline Phosphatase: 54 U/L (ref 38–126)
Anion gap: 8 (ref 5–15)
BUN: 30 mg/dL — ABNORMAL HIGH (ref 6–20)
CO2: 37 mmol/L — ABNORMAL HIGH (ref 22–32)
Calcium: 8.4 mg/dL — ABNORMAL LOW (ref 8.9–10.3)
Chloride: 97 mmol/L — ABNORMAL LOW (ref 98–111)
Creatinine, Ser: 1.98 mg/dL — ABNORMAL HIGH (ref 0.44–1.00)
GFR calc Af Amer: 32 mL/min — ABNORMAL LOW (ref >60)
GFR calc non Af Amer: 28 mL/min — ABNORMAL LOW (ref >60)
Glucose, Bld: 96 mg/dL (ref 70–99)
Potassium: 5.2 mmol/L — ABNORMAL HIGH (ref 3.5–5.1)
Sodium: 142 mmol/L (ref 135–145)
Total Bilirubin: 0.5 mg/dL (ref 0.3–1.2)
Total Protein: 6.3 g/dL — ABNORMAL LOW (ref 6.5–8.1)

## 2019-06-18 LAB — MAGNESIUM: Magnesium: 1.7 mg/dL (ref 1.7–2.4)

## 2019-06-18 LAB — PHOSPHORUS: Phosphorus: 4.2 mg/dL (ref 2.5–4.6)

## 2019-06-18 MED ORDER — ARFORMOTEROL TARTRATE 15 MCG/2ML IN NEBU
15.0000 ug | INHALATION_SOLUTION | Freq: Two times a day (BID) | RESPIRATORY_TRACT | Status: DC
  Administered 2019-06-18 – 2019-06-30 (×22): 15 ug via RESPIRATORY_TRACT
  Filled 2019-06-18 (×24): qty 2

## 2019-06-18 MED ORDER — ALPRAZOLAM 0.5 MG PO TABS
0.5000 mg | ORAL_TABLET | Freq: Three times a day (TID) | ORAL | Status: DC
  Administered 2019-06-18 – 2019-06-23 (×17): 0.5 mg via ORAL
  Filled 2019-06-18 (×17): qty 1

## 2019-06-18 MED ORDER — HEPARIN SODIUM (PORCINE) 5000 UNIT/ML IJ SOLN
5000.0000 [IU] | Freq: Three times a day (TID) | INTRAMUSCULAR | Status: DC
  Administered 2019-06-19 – 2019-06-23 (×8): 5000 [IU] via SUBCUTANEOUS
  Filled 2019-06-18 (×12): qty 1

## 2019-06-18 MED ORDER — LORAZEPAM 2 MG/ML IJ SOLN: 0.5000 mg | Freq: Four times a day (QID) | INTRAMUSCULAR | Status: DC | PRN

## 2019-06-18 MED ORDER — FENTANYL CITRATE (PF) 100 MCG/2ML IJ SOLN
12.5000 ug | INTRAMUSCULAR | Status: DC | PRN
  Administered 2019-06-18 – 2019-06-19 (×2): 12.5 ug via INTRAVENOUS
  Filled 2019-06-18 (×3): qty 2

## 2019-06-18 MED ORDER — MAGNESIUM SULFATE 2 GM/50ML IV SOLN
2.0000 g | Freq: Once | INTRAVENOUS | Status: AC
  Administered 2019-06-18: 2 g via INTRAVENOUS
  Filled 2019-06-18: qty 50

## 2019-06-18 MED ORDER — SODIUM ZIRCONIUM CYCLOSILICATE 10 G PO PACK
10.0000 g | PACK | Freq: Once | ORAL | Status: AC
  Administered 2019-06-18: 10 g via ORAL
  Filled 2019-06-18: qty 1

## 2019-06-18 MED ORDER — BUDESONIDE 0.25 MG/2ML IN SUSP
0.2500 mg | Freq: Two times a day (BID) | RESPIRATORY_TRACT | Status: DC
  Administered 2019-06-18 – 2019-06-30 (×22): 0.25 mg via RESPIRATORY_TRACT
  Filled 2019-06-18 (×25): qty 2

## 2019-06-18 NOTE — Significant Event (Signed)
Rapid Response Event Note  Overview: Follow Up  I went to see the patient at 12:30, she was resting with the BIPAP on, settings remained the same. RTs and RNs have informed me that the patient has been trying to take the mask at times and then has to be re-educated on the importance of compliance with the BIPAP. VSS - see flowsheet.   Nurse informed me that TRH MD ordered ABG.   Jahmeir Geisen R

## 2019-06-18 NOTE — Progress Notes (Addendum)
Pt sleep on BIPAP at this time and not wanting to take meds. Will try to reassess at a later time.   2330 Pt woke and willing to take meds at this time.   0400 Pt wanted BIPAP off due to it hurting her face. Reached out to RT to come assess. RT changed her mask and educated her on BIPAP usage and her plan of care. Pt willing to comply at this time.

## 2019-06-18 NOTE — Significant Event (Signed)
Rapid Response Event Note  Overview: Called this AM with concerns for worsening ABG-7.21/103/172/39.8. Pt is alert and oriented, asking what her oxygen level is. Advised RN to alert NP to ABG results and to call RRT if assistance needed. Will give sign-out to day shift RR RN.   Dillard Essex

## 2019-06-18 NOTE — Progress Notes (Signed)
Patient continues to pull BiPAP off intermittently throughout the day stating she does not want to wear it anymore and that she can breath just fine. Education of why she needs the BiPAP and how it will help lower her CO2 levels. Despite multiple attempts to educate on the importance she continues to pull the BiPAP off. MD, RT, charge nurse, and rapid response made aware. Patient does allow RN to put BiPAP back on each time. RN will continue to monitor closely.

## 2019-06-18 NOTE — Progress Notes (Signed)
Pt intermittently refused BiPap overnight. Appears more confused this am and ABG worsened while on BiPap for a few hours this morning. Tidal Volumes also decreased compared to yesterday evening, now 200-350s. Information relayed via text page to Dr. Alfredia Ferguson.

## 2019-06-18 NOTE — Significant Event (Signed)
Rapid Response Event Note  Overview: Respiratory - Follow Up  Initial Focused Assessment: I reviewed the patient's chart and ABGs from the overnight hours and went to go see her. Upon arrival, patient was trying to take the BIPAP mask off, her nurse was able to talk to her and have her keep the mask on. I discussed with the [atient AGAIN the importance of her compliance with the BIPAP. Patient was alert, oriented, and seems to be able to reason and understand what the staff is saying to her. VS - BP 116/70 (84). HR 70s, RR 16-22, Vt 300 at best on BIPAP 70% 18/8 - saturations 94-98%, afebrile. Lung sounds - quite diminished.   PCCM MD came to the bedside while I was there.   Interventions: -- NO RRT INTERVENTIONS   Plan of Care: -- Encourage BIPAP as long as patient is willing to comply -- NPO except sips with MEDS -- Monitor VS and neurologic -- Avoid narcotics/sedating medications -- Rest per MD  Of note, it was my clinical recommendation that patient be transferred to the ICU for closer monitoring. Even though current mental status is alert and oriented, I am concerned that she can decompensate quickly given that her CO2 has not improved with BIPAP therapy.   Event Summary: Start Time 0745 End Time 0825  Hester Joslin R

## 2019-06-18 NOTE — Consult Note (Signed)
NAME:  Kara Vincent, MRN:  [id number], DOB:  06/22/1965, LOS: 4 ADMISSION DATE:  06/13/2019, CONSULTATION DATE:  06/18/19 REFERRING MD:  Alfredia Ferguson, CHIEF COMPLAINT:  SOB   Brief History   54 year old woman with hx of morbid obesity, chronic pain/anxiety presenting with acute on chronic hypercarbia.  History of present illness   53 year old woman nonsmoker with hx of morbid obesity, chronic pain/anxiety presenting with acute on chronic hypercarbia.  Has a presumed diagnosis of lower ext cellulitis. She does not like wearing BIPAP but eventually was coerced into wearing it a few hours this AM.  ABG shows worsening retention.  PCCM consulted for further recs.  Patient is anxious and requesting meds by name for this.  Past Medical History  OSA w/o CPAP, chronic O2 need, OHS, homeless, "asthma", hidranitis, anxiety, chronic pain  Significant Hospital Events   3/23 admitted  Consults:  PCCM  Procedures:  N/A  Significant Diagnostic Tests:  CTA chest- obese LE doppler neg   Micro Data:  COVID neg HIV neg MRSA swab neg  Antimicrobials:  Ceftriaxone 3/22>>  Interim history/subjective:  Anxious  Objective   Blood pressure 97/63, pulse 74, temperature 98.4 F (36.9 C), temperature source Oral, resp. rate 16, height 5\' 6"  (1.676 m), weight (!) 179.2 kg, last menstrual period 03/24/2009, SpO2 100 %.    FiO2 (%):  [50 %-70 %] 70 %   Intake/Output Summary (Last 24 hours) at 06/18/2019 0809 Last data filed at 06/18/2019 0300 Gross per 24 hour  Intake 554.22 ml  Output --  Net 554.22 ml   Filed Weights   06/14/19 1400 06/16/19 2129  Weight: (!) 182.8 kg (!) 179.2 kg    Examination: GEN: obese woman on BIPAP HEENT: BIPAP in place, intermittent seal, getting TV of about 580cc on 18/8, breathing over set rate of 16 CV: RRR, ext warm PULM: diminished bases, no wheezing GI: Soft, +BS EXT: No edema, just adipose tissue, not seeing too much cellulitis evidence NEURO: moves all  4 ext to command, occasional CO2 twitches noted PSYCH: RASS +1 SKIN: multiple scabs over lower ext   Resolved Hospital Problem list   N/A  Assessment & Plan:  Acute on chronic hypercarbic respiratory failure, underlying OSA/OHS, 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  She does not look particularly septic or fluid overloaded.  No symptoms of bronchospasm although can give nebs a shot.  Overall, I would give her more time.  Okay to give some anxiolytics if it improves BIPAP compliance.  Long term probably needs trach but that presents some tough placement ramifications. - Continue BIPAP at night and PRN during day - Decision for drawing ABG should be based on depressed mental status; if she is awake and interactive I see no reason to keep drawing them - Likewise, decision for ICU transfer is based on level of alertness and/or contraindications for BIPAP - I am okay with her eating and taking pills, just needs BIPAP in between - D/C head CT, no indication for this  AKI on CKD- looks volume down if anything, would leave alone for now and monitor  Will follow with you.  Call me if any acute changes.  Erskine Emery MD PCCM  Labs   CBC: Recent Labs  Lab 06/14/19 0615 06/15/19 0950 06/16/19 0326 06/17/19 0531 06/18/19 0353  WBC 12.4* 11.1* 12.4* 13.1* 9.9  NEUTROABS  --  9.0* 10.0* 10.4* 7.3  HGB 9.6* 11.1*  11.3* 10.6* 9.8*  HCT 33.7* 38.4 39.0 37.5 34.7*  MCV 101.2* 100.5* 100.3* 103.9* 103.0*  PLT 233 312 350 365 254    Basic Metabolic Panel: Recent Labs  Lab 06/13/19 1901 06/13/19 1901 06/14/19 0615 06/15/19 0950 06/16/19 0326 06/17/19 0531 06/18/19 0353  NA 140  --   --  142 141 142 142  K 5.0  --   --  5.3* 4.9 5.6* 5.2*  CL 103  --   --  100 98 95* 97*  CO2 28  --   --  32 34* 36* 37*  GLUCOSE 134*  --   --  112* 125* 111* 96  BUN 36*  --   --  21* 22* 27* 30*  CREATININE 1.85*   < > 1.68* 1.54* 1.40* 1.66*  1.98*  CALCIUM 8.4*  --   --  8.9 8.7* 8.8* 8.4*  MG  --   --   --  1.9 1.7 1.8 1.7  PHOS  --   --   --  4.9* 4.8* 4.3 4.2   < > = values in this interval not displayed.   GFR: Estimated Creatinine Clearance: 54.4 mL/min (A) (by C-G formula based on SCr of 1.98 mg/dL (H)). Recent Labs  Lab 06/13/19 1902 06/14/19 0044 06/14/19 0615 06/15/19 0950 06/16/19 0326 06/17/19 0531 06/18/19 0353  WBC  --   --    < > 11.1* 12.4* 13.1* 9.9  LATICACIDVEN 0.8 0.7  --   --   --   --   --    < > = values in this interval not displayed.    Liver Function Tests: Recent Labs  Lab 06/13/19 1901 06/15/19 0950 06/16/19 0326 06/17/19 0531 06/18/19 0353  AST 18 12* 13* 10* 9*  ALT 14 15 15 14 12   ALKPHOS 66 70 72 64 54  BILITOT 0.5 0.5 0.5 0.3 0.5  PROT 7.1 7.3 7.5 7.2 6.3*  ALBUMIN 3.1* 3.0* 3.0* 3.0* 2.7*   No results for input(s): LIPASE, AMYLASE in the last 168 hours. Recent Labs  Lab 06/17/19 1441  AMMONIA 20    ABG    Component Value Date/Time   PHART 7.213 (L) 06/18/2019 0546   PCO2ART 103 (HH) 06/18/2019 0546   PO2ART 172 (H) 06/18/2019 0546   HCO3 39.8 (H) 06/18/2019 0546   TCO2 34 (H) 07/10/2017 2056   O2SAT 98.4 06/18/2019 0546     Coagulation Profile: No results for input(s): INR, PROTIME in the last 168 hours.  Cardiac Enzymes: No results for input(s): CKTOTAL, CKMB, CKMBINDEX, TROPONINI in the last 168 hours.  HbA1C: Hgb A1c MFr Bld  Date/Time Value Ref Range Status  12/01/2017 03:41 PM 5.5 4.8 - 5.6 % Final    Comment:             Prediabetes: 5.7 - 6.4          Diabetes: >6.4          Glycemic control for adults with diabetes: <7.0   12/14/2015 03:03 AM 5.5 4.8 - 5.6 % Final    Comment:    (NOTE)         Pre-diabetes: 5.7 - 6.4         Diabetes: >6.4         Glycemic control for adults with diabetes: <7.0     CBG: Recent Labs  Lab 06/17/19 0643 06/17/19 1145  GLUCAP 100* 113*    Review of Systems:    Positive Symptoms in  bold:  Constitutional fevers, chills, weight loss, fatigue, anorexia, malaise  Eyes decreased vision, double vision, eye irritation  Ears, Nose, Mouth, Throat sore throat, trouble swallowing, sinus congestion  Cardiovascular chest pain, paroxysmal nocturnal dyspnea, lower ext edema, palpitations   Respiratory SOB, cough, DOE, hemoptysis, wheezing  Gastrointestinal nausea, vomiting, diarrhea  Genitourinary burning with urination, trouble urinating  Musculoskeletal joint aches, joint swelling, back pain  Integumentary  rashes, skin lesions  Neurological focal weakness, focal numbness, trouble speaking, headaches  Psychiatric depression, anxiety, confusion  Endocrine polyuria, polydipsia, cold intolerance, heat intolerance  Hematologic abnormal bruising, abnormal bleeding, unexplained nose bleeds  Allergic/Immunologic recurrent infections, hives, swollen lymph nodes     Past Medical History  She,  has a past medical history of Allergy, Anxiety, Arthritis, Asthma, Asthma, CHF (congestive heart failure) ([place]), CHF (congestive heart failure) ([place]), Chronic abdominal pain, Chronic kidney disease, Chronic pain, Darier disease, GERD (gastroesophageal reflux disease), Gout, Hidradenitis, HLD (hyperlipidemia) (12/14/2015), Homelessness, Hyperlipemia, Hypertension, Low back pain, Morbid obesity ([facility]), MRSA (methicillin resistant Staphylococcus aureus), On home O2, OSA (obstructive sleep apnea), Oxygen deficiency, Renal insufficiency, and Sleep apnea.   Surgical History    Past Surgical History:  Procedure Laterality Date  . BACK SURGERY    . CESAREAN SECTION    . CESAREAN SECTION    . FRACTURE SURGERY    . TUBAL LIGATION       Social History   reports that she quit smoking about 16 years ago. Her smoking use included cigarettes. She quit smokeless tobacco use about 41 years ago. She reports current alcohol use. She reports current drug use. Drugs: "Crack" cocaine and Other-see comments.    Family History   Her family history includes Arthritis in her sister and sister; Asthma in her daughter, mother, sister, sister, and son; Diabetes in her father; Heart disease in her father; Stroke in her father.   Allergies Allergies  Allergen Reactions  . Ibuprofen Hives, Shortness Of Breath, Palpitations and Rash    Has tolerated toradol 12/2013 inpatient  . Penicillins Anaphylaxis, Hives and Rash    Has patient had a PCN reaction causing immediate rash, facial/tongue/throat swelling, SOB or lightheadedness with hypotension: Yes Has patient had a PCN reaction causing severe rash involving mucus membranes or skin necrosis: Yes Has patient had a PCN reaction that required hospitalization Unknown Has patient had a PCN reaction occurring within the last 10 years: Unknown If all of the above answers are "NO", then may proceed with Cephalosporin use.   . Sulfa Antibiotics Hives, Shortness Of Breath, Rash and Cough  . Adhesive [Tape] Rash  . Doxycycline Hives, Swelling and Rash  . Sulfamethoxazole-Trimethoprim Rash  . Zithromax [Azithromycin] Hives, Swelling and Rash     Home Medications  Prior to Admission medications   Medication Sig Start Date End Date Taking? Authorizing Provider  acetaminophen-codeine (TYLENOL #3) 300-30 MG tablet Take 1 tablet by mouth every 8 (eight) hours as needed for moderate pain. Each prescription to last one month 05/25/19  Yes Ladell Pier, MD  albuterol (VENTOLIN HFA) 108 (90 Base) MCG/ACT inhaler Inhale 1-2 puffs into the lungs every 6 (six) hours as needed for wheezing or shortness of breath. 11/22/18 06/13/28 Yes Ladell Pier, MD  allopurinol (ZYLOPRIM) 100 MG tablet Take 1 tablet (100 mg total) by mouth every morning. Patient taking differently: Take 100 mg by mouth daily.  11/22/18  Yes Ladell Pier, MD  cholecalciferol (VITAMIN D3) 25 MCG (1000 UNIT) tablet Take 1,000 Units by mouth daily.  Yes [provider]  cloNIDine  (CATAPRES) 0.2 MG tablet Take 1 tablet (0.2 mg total) by mouth 2 (two) times daily. 11/22/18  Yes Ladell Pier, MD  Collagen-Boron-Hyaluronic Acid ([employer] JOINT HEALTH TRIPLE ACTION PO) Take 1 tablet by mouth daily.   Yes [provider]  DULoxetine (CYMBALTA) 30 MG capsule Take 1 capsule (30 mg total) by mouth daily. 04/15/19  Yes Ladell Pier, MD  hydrOXYzine (ATARAX/VISTARIL) 50 MG tablet TAKE ONE TABLET BY MOUTH THREE TIMES DAILY AS NEEDED FOR ANXIETY OR FOR ITCHING Patient taking differently: Take 50 mg by mouth every 8 (eight) hours as needed for anxiety.  06/07/19  Yes Ladell Pier, MD  isosorbide mononitrate (IMDUR) 30 MG 24 hr tablet Take 1 tablet (30 mg total) by mouth daily. 04/15/19  Yes Ladell Pier, MD  ketoconazole (NIZORAL) 2 % shampoo APPLY ONE application topically two times WEEK. APPLY TO HAIR FOR treatment of dandruff 06/14/19   Ladell Pier, MD  lisinopril (ZESTRIL) 40 MG tablet TAKE ONE TABLET BY MOUTH EVERY DAY Patient taking differently: Take 40 mg by mouth daily.  04/15/19  Yes Ladell Pier, MD  nystatin (MYCOSTATIN/NYSTOP) powder Apply topically 4 (four) times daily. Apply beneath breast and below abdominal fold 04/15/19  Yes Ladell Pier, MD  Omega-3 Fatty Acids (FISH OIL PO) Take 1 capsule by mouth daily.   Yes [provider]  pantoprazole (PROTONIX) 40 MG tablet Take 1 tablet (40 mg total) by mouth daily. 11/22/18  Yes Ladell Pier, MD  triamcinolone cream (KENALOG) 0.1 % APPLY topically TO THE RASH ON your back TWICE DAILY Patient taking differently: Apply 1 application topically 2 (two) times daily. To rash on back 05/17/19  Yes Ladell Pier, MD  VESICARE 5 MG tablet TAKE ONE TABLET BY MOUTH EVERY MORNING Patient taking differently: Take 5 mg by mouth daily.  05/19/19  Yes Ladell Pier, MD  VITAMIN E PO Take 1 tablet by mouth daily.   Yes [provider]  atorvastatin (LIPITOR) 20 MG tablet Take  1 tablet (20 mg total) by mouth every morning. Patient not taking: Reported on 06/14/2019 11/22/18   Ladell Pier, MD  bacitracin ointment Apply 1 application topically 2 (two) times daily. Patient not taking: Reported on 06/14/2019 03/12/19   Horton, Barbette Hair, MD  cephALEXin (KEFLEX) 500 MG capsule Take 1 capsule (500 mg total) by mouth 4 (four) times daily. Patient not taking: Reported on 06/14/2019 03/12/19   Horton, Barbette Hair, MD  diclofenac sodium (VOLTAREN) 1 % GEL Apply 4 g topically 4 (four) times daily. Patient not taking: Reported on 06/14/2019 11/22/18   Ladell Pier, MD  furosemide (LASIX) 40 MG tablet Take 1 tablet (40 mg total) by mouth daily. 1 tab po daily Patient not taking: Reported on 06/14/2019 04/15/19   Ladell Pier, MD  ipratropium-albuterol (DUONEB) 0.5-2.5 (3) MG/3ML SOLN Take 3 mLs by nebulization every 6 (six) hours as needed (SOB). 07/16/17 03/12/19  Arrien, Jimmy Picket, MD  Misc. Devices MISC Please provide patient with a raised toilet seat that is covered under her current insurance plan. 03/29/18   Gildardo Pounds, NP  Misc. Devices MISC Heavy duty shower chair; Heavy duty potty chair. Dx: Osteoarthritis 10/13/18   Charlott Rakes, MD  mupirocin cream (BACTROBAN) 2 % Apply to affected area twice a day Patient not taking: Reported on 06/14/2019 11/22/18   Ladell Pier, MD  nystatin cream (MYCOSTATIN) APPLY TOPICALLY TO THE  AFFECTED AREA(S) TWICE DAILY Patient not taking: Reported on 06/14/2019 12/28/17   Ladell Pier, MD  diphenhydrAMINE (SOMINEX) 25 MG tablet Take 25 mg by mouth at bedtime as needed.  06/14/11  [provider]

## 2019-06-18 NOTE — Progress Notes (Addendum)
PT. Awake around 2230, pulled bipap off. Refusing to put bipap back on at this time. Educated patient on importance of bipap and the need for ABG. Placed on 4L [place].   RT placed bipap back on patient around 2245. Pt. Began coughing, coughing up thick, yellow sputum. Pulled bipap off again. After about 4-5 time of replacing bipap on patient and educating importance, Placed her on 4L [place]. Robitussin, Oxycodone and Hydroxyzine given at [place]. Placed back on bipap around 0200.   0500 ABG obtained. PH- 7.213/103/172/39.8. RT notified, RRT notified and Dr. Silas [place] notified.

## 2019-06-18 NOTE — Progress Notes (Signed)
PROGRESS NOTE    Kara Vincent  VEL:[id number] DOB: 1965/06/17 DOA: 06/13/2019 PCP: Ladell Pier, MD  Brief Narrative: HPI per Dr. Gean Birchwood on 06/14/19 Kara Vincent is a 54 y.o. female with care morbid obesity asthma chronic kidney disease stage III hypertension gout presents to the ER because of increasing pain and swelling of the both lower extremities more on the right side.  This has been ongoing for last 1 week.  Patient states she also got more short of breath than usual.  Has some pleuritic type of chest pain denies any productive cough or fever or chills.  Patient in addition also has been having some frontal headache with no focal deficits.  Headache has been present for last few hours.  ED Course: In the ER on exam patient has bilateral lower extremity erythema and swelling of both legs more on the right side.  No signs of any compartment syndrome.  The swelling extends from the ankle up to the mid calf.  Patient also was mildly hypoxic usually uses chronic oxygen at home.  Has not been using her CPAP recently because she lost it.  Chest x-ray shows nonspecific findings for possible opacity.  CT angiogram was unremarkable.  Labs show creatinine 1.8 BNP 120 lactic acid 1.8 hemoglobin 10.6 WBC 11.4 x-ray of the both left-side and right side shows edema.  EKG shows normal sinus rhythm with low voltage.  **Interim History Cellulitis is improving somewhat.  Patient complaining some nausea now.  Still wearing oxygen but wears 3 L at baseline.  Have ordered her CPAP for the night but she is refused.  She apparently has bedbugs so we will give her permethrin 5% cream apply topically from the neck down.  Cellulitis appears to be improving.  Patient was little somnolent but easily arousable and states that she is feeling a little bit better yesterday but she is more somnolent again today and a little worse.  She started having some tremors and some twitching.  Neurology was  consulted and an ABG was ordered and she was found to have acute respiratory failure with hypercarbia and hypoxia and she was transferred to the stepdown unit for BiPAP given her PCO2 of 95.  Neurology worked the patient up for twitching but thinks it is secondary to her hypercarbia  06/18/27: Overnight the patient was intermittently wearing BiPAP and this morning she was more hypercarbic and somnolent and agitated.  Because of her worsened ABG critical care was consulted and Dr. Tamala Julian recommended to continue BiPAP nightly and as needed during the day and recommends monitoring her closely.  Her CO2 did improve some after she did wear and will need to continue monitor her carefully.  She has been intermittently agitated and intermittently noncompliant with being off BiPAP throughout the day and has a high risk for decompensation.  Assessment & Plan:   Principal Problem:   Cellulitis of both lower extremities Active Problems:   Asthma exacerbation   CKD (chronic kidney disease) stage 3, GFR 30-59 ml/min (HCC)   OSA (obstructive sleep apnea)   Essential hypertension   Gout   Morbid obesity ([place])   Acute respiratory failure with hypoxia (HCC)   Cellulitis  Cellulitis of the Right Lower Extremity, improving somewhat -for which patient is on empiric antibiotics with IV Ceftriaxone.  And will continue -Will check Dopplers to rule out DVT and showed "No evidence of deep vein thrombosis seen in the lower extremities, bilaterally." -No signs of any compartment syndrome. -WBC  has been hovering around 11 and 12 but yesterday was 13.1 and today is improved at 9.9 -Lactic acid level was 0.7 -She is started on IV fluid hydration yesterday with normal saline at 50 MLS per hour but will stop now -Elevate Extremity -Continue monitor temperature curve and continue monitor clinical/letter intervention  Acute on chronic respiratory failure hypoxia and now hypercarbia could be from asthma versus possible fluid  likely in the setting of ? Diastolic CHF  OSA and obesity hypoventilation syndrome.   -BNP was mildly elevated at 120.9 -CT angio was without PE -She wears 3 L of supplemental oxygen at home; now requiring BiPAP given that she had a pH of 1.225, PCO2 of 95.2, PO2 of 79.6, bicarbonate level 30.2, and an ABG O2 saturation of 94.3% on 36% FiO2 -We will need to blow off her CO2 and will attempt BiPAP.  BiPAP was initiated yesterday and patient has been intermittently compliant with it overnight however her ABG worsened this morning with a PCO2 over 100 and her being agitated next-pulmonary is consulted and they recommended continue observation for now continue with BiPAP -Patient states she takes Lasix 40 mg twice daily and this was continued but we will stop given her worsening renal function and hyperkalemia for now and will start IV fluid hydration -This is likely multifactorial probably due to OSA and obesity hypoventilation syndrome -Closely monitor continued pulmonary toilet and continued monitoring of her respiratory status -Repeat chest x-ray in a.m. and appreciate pulmonary evaluating her -Continue CPAP at night and ensure compliance -We will need an ambulatory home O2 screen prior to discharge -BiPAP nightly and as needed and encourage compliance -Most recent ABG was 7.302 pH, PCO2 of 81.0, PO2 of 74.8, bicarbonate level 39.4, ABG O2 saturation 95.4% on 40% FiO2  Twitching and myoclonic jerking -Neurology consulted  -Ammonia level was 20 -Neurology feels this is secondary to hypercarbia but they are ruling out other etiologies next-head CT was ordered as well as an EEG; EEG had no evidence of any seizure activity -Permethrin cream has been stopped and will reduce her hydroxyzine -Continue to monitor and likely this is secondary to her hypercarbia  Hypertension  -Continue with clonidine, Imdur but will hold her lisinopril for now -She is now 143/65  Chronic kidney disease stage IIIb  worsening  Hyperphosphatemia, improving  -Creatinine is appear to be baseline but however slightly worsened today given Lasix usage now is further worsened and it is ended up at 30/1.98 -Patient's phosphorus level was 4.3 and today is 4.2 -Closely monitor closely since patient is on Lasix and lisinopril.;  Will hold her Lasix and hold her lisinopril given her hyperkalemia -Avoid further nephrotoxic medications, contrast dyes, hypotension and renally dose medications -We will start gentle IV fluid hydration at 50 cc/h and this was increased to 75 MLS per hour we will stop and monitor off of fluids -Repeat CMP in a.m. 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 -Patient's hemoglobin/hematocrit was 10.6/37.5 today is 9.8/34.7 -Check anemia panel in the a.m. -Continue to monitor for signs and symptoms of bleeding; currently no overt bleeding noted -Repeat CBC in a.m.  Hyperkalemia -Mild as patient's potassium is 5.2 -Continue to monitor and trend -We will stop the Lasix and hold her lisinopril and started gentle IV fluid hydration and give a dose of Lokelma again today -IVF increased initially but now has been stopped  History of Gout  -C/w Allopurinol for  now but may need to hold   History of Sleep Apnea  -Has not been compliant due to patient recently lost her CPAP.   -CPAP at bedtime ordered but she refused; -We will place on BiPAP she has been intermittently night and continues to pull it off  -?PNA -Mild il-defined opacity on the left side of the lung field on chest x-ray.   -Repeat chest x-ray this AM showed "The mediastinal contour is stable. Heart size is enlarged. Increased pulmonary interstitium is identified bilaterally. No focal pneumonia or pleural effusion is identified. The bony structures are stable." -Continue with albuterol 2.5 mg IH every 4 hours as needed for wheezing or shortness of  breath -Continue with BiPAP if she allows -Chest x-ray is read as CHF however we will need to monitor her given that it is very difficult to assess her clinical volume status given her significant obesity; we will stop her IV fluid hydration currently and monitor off of IV fluids  HLD -C/w omega-3 acid ethyl esters  GERD -Continue with Pantoprazole 40 p.o. daily  Super Morbid Obesity -Estimated body mass index is 63.77 kg/m as calculated from the following:   Height as of this encounter: 5\' 6"  (1.676 m).   Weight as of this encounter: 179.2 kg. -Weight Loss and Dietary Counseling given   Chronic Pain -Continue with Tylenol 3 p.o. every 8 hours and Percocet 2 tabs p.o. every 6 as needed -avoid further narcotic medications given somnolence -We will give her IV fentanyl while she is n.p.o. wearing the BiPAP  Constipation -DG Abd showed "Air distention of nondescript bowel loop within the right hemiabdomen, nonspecific. Large stool burden within the right colon and distal colon/rectum." -Start the patient on adequate bowel regimen and try suppository  Bedbugs -Recently exposed in a motel that had bedbugs with mattress from infested -Recommend washing all belongings in extremely hot water if cannot wash belongings and place him in a trash bag and leave him for 1 week -Apply permethrin cream however will not reapply given that she ended up having some jerking and do not know if this is from the hypercarbia or a permethrin cream- -Continue with contact precautions  DVT prophylaxis: Enoxaparin 40 mg sq q24h Code Status: FULL CODE Family Communication: Discussed with significant other at bedside again today Disposition Plan: Pending further clinical improvement of her cellulitis and nausea.  Patient is from home and requiring IV antibiotics for her cellulitis  Consultants:   Pulmonary  Neurology  Procedures:  LE VENOUS DUPLEX BILATERAL:  - No evidence of deep vein thrombosis  seen in the lower extremities,  bilaterally.   Antimicrobials:  Anti-infectives (From admission, onward)   Start     Dose/Rate Route Frequency Ordered Stop   06/14/19 2000  cefTRIAXone (ROCEPHIN) 1 g in sodium chloride 0.9 % 100 mL IVPB     1 g 200 mL/hr over 30 Minutes Intravenous Every 24 hours 06/14/19 0756     06/14/19 0430  vancomycin (VANCOREADY) IVPB 2000 mg/400 mL     2,000 mg 200 mL/hr over 120 Minutes Intravenous  Once 06/14/19 0430 06/14/19 0700   06/14/19 0400  cefTRIAXone (ROCEPHIN) 1 g in sodium chloride 0.9 % 100 mL IVPB     1 g 200 mL/hr over 30 Minutes Intravenous  Once 06/14/19 0346 06/14/19 0450     Subjective: Seen and examined at bedside and she was little bit calmer when I saw her this morning but that I reevaluated her in the afternoon she was  significantly agitated and kept pulling off her BiPAP.  She was very frustrated and she appeared a little confused.  Significant other was at bedside and tried to get her to comply.  No nausea or vomiting.  This morning when she was calmer she stated that her leg was still hurting.  No other concerns or complaints at this time.  Objective: Vitals:   06/18/19 1144 06/18/19 1234 06/18/19 1405 06/18/19 1554  BP:  (!) 129/59  (!) 143/65  Pulse: 65 70 72 81  Resp: 17 16 (!) 21 19  Temp:  (!) 97.1 F (36.2 C) 98.1 F (36.7 C) 98 F (36.7 C)  TempSrc:  Axillary Oral Oral  SpO2: 95% 98% 97% 90%  Weight:      Height:        Intake/Output Summary (Last 24 hours) at 06/18/2019 1814 Last data filed at 06/18/2019 1500 Gross per 24 hour  Intake 692.81 ml  Output --  Net 692.81 ml   Filed Weights   06/14/19 1400 06/16/19 2129  Weight: (!) 182.8 kg (!) 179.2 kg   Examination: Physical Exam:  Constitutional: WN/WD super morbidly obese Caucasian female currently who is wearing the BiPAP and was little bit calmer this morning but then reevaluate this afternoon she is significantly agitated and a little bit confused, Eyes:  Lids and conjunctivae normal, sclerae anicteric  ENMT: External Ears, Nose appear normal. Grossly normal hearing.  Neck: Appears normal, supple, no cervical masses, normal ROM, no appreciable thyromegaly; difficult to assess JVD given her body habitus Respiratory: Diminished to auscultation bilaterally with coarse breath sounds and some dry crackles but no appreciable, no wheezing, rales, rhonchi.  Slightly increased respiratory effort wearing the BiPAP Cardiovascular: RRR, no murmurs / rubs / gallops. S1 and S2 auscultated.  1+ lower extremity edema Abdomen: Soft, non-tender, distended second body habitus. Bowel sounds positive.  GU: Deferred. Musculoskeletal: No clubbing / cyanosis of digits/nails. No joint deformity upper and lower extremities.  Skin: Has significant bug bites diffusely throughout her body on her arms and legs and her right leg is not erythematous or warm as it was yesterday and the area of cellulitis is improving.. No induration; Warm and dry.  Neurologic: CN 2-12 grossly intact with no focal deficits.Romberg sign cerebellar reflexes not assessed.  Psychiatric: Normal judgment and insight.  Slightly somnolent but does arouse easily and is awake. Normal mood and appropriate affect.   Data Reviewed: I have personally reviewed following labs and imaging studies  CBC: Recent Labs  Lab 06/14/19 0615 06/15/19 0950 06/16/19 0326 06/17/19 0531 06/18/19 0353  WBC 12.4* 11.1* 12.4* 13.1* 9.9  NEUTROABS  --  9.0* 10.0* 10.4* 7.3  HGB 9.6* 11.1* 11.3* 10.6* 9.8*  HCT 33.7* 38.4 39.0 37.5 34.7*  MCV 101.2* 100.5* 100.3* 103.9* 103.0*  PLT 233 312 350 365 601   Basic Metabolic Panel: Recent Labs  Lab 06/13/19 1901 06/13/19 1901 06/14/19 0615 06/15/19 0950 06/16/19 0326 06/17/19 0531 06/18/19 0353  NA 140  --   --  142 141 142 142  K 5.0  --   --  5.3* 4.9 5.6* 5.2*  CL 103  --   --  100 98 95* 97*  CO2 28  --   --  32 34* 36* 37*  GLUCOSE 134*  --   --  112* 125*  111* 96  BUN 36*  --   --  21* 22* 27* 30*  CREATININE 1.85*   < > 1.68* 1.54* 1.40* 1.66* 1.98*  CALCIUM 8.4*  --   --  8.9 8.7* 8.8* 8.4*  MG  --   --   --  1.9 1.7 1.8 1.7  PHOS  --   --   --  4.9* 4.8* 4.3 4.2   < > = values in this interval not displayed.   GFR: Estimated Creatinine Clearance: 54.4 mL/min (A) (by C-G formula based on SCr of 1.98 mg/dL (H)). Liver Function Tests: Recent Labs  Lab 06/13/19 1901 06/15/19 0950 06/16/19 0326 06/17/19 0531 06/18/19 0353  AST 18 12* 13* 10* 9*  ALT 14 15 15 14 12   ALKPHOS 66 70 72 64 54  BILITOT 0.5 0.5 0.5 0.3 0.5  PROT 7.1 7.3 7.5 7.2 6.3*  ALBUMIN 3.1* 3.0* 3.0* 3.0* 2.7*   No results for input(s): LIPASE, AMYLASE in the last 168 hours. Recent Labs  Lab 06/17/19 1441  AMMONIA 20   Coagulation Profile: No results for input(s): INR, PROTIME in the last 168 hours. Cardiac Enzymes: No results for input(s): CKTOTAL, CKMB, CKMBINDEX, TROPONINI in the last 168 hours. BNP (last 3 results) No results for input(s): PROBNP in the last 8760 hours. HbA1C: No results for input(s): HGBA1C in the last 72 hours. CBG: Recent Labs  Lab 06/17/19 0643 06/17/19 1145  GLUCAP 100* 113*   Lipid Profile: No results for input(s): CHOL, HDL, LDLCALC, TRIG, CHOLHDL, LDLDIRECT in the last 72 hours. Thyroid Function Tests: No results for input(s): TSH, T4TOTAL, FREET4, T3FREE, THYROIDAB in the last 72 hours. Anemia Panel: No results for input(s): VITAMINB12, FOLATE, FERRITIN, TIBC, IRON, RETICCTPCT in the last 72 hours. Sepsis Labs: Recent Labs  Lab 06/13/19 1902 06/14/19 0044  LATICACIDVEN 0.8 0.7    Recent Results (from the past 240 hour(s))  SARS CORONAVIRUS 2 (TAT 6-24 HRS) Nasopharyngeal Nasopharyngeal Swab     Status: None   Collection Time: 06/14/19  5:15 AM   Specimen: Nasopharyngeal Swab  Result Value Ref Range Status   SARS Coronavirus 2 NEGATIVE NEGATIVE Final    Comment: (NOTE) SARS-CoV-2 target nucleic acids are NOT  DETECTED. The SARS-CoV-2 RNA is generally detectable in upper and lower respiratory specimens during the acute phase of infection. Negative results do not preclude SARS-CoV-2 infection, do not rule out co-infections with other pathogens, and should not be used as the sole basis for treatment or other patient management decisions. Negative results must be combined with clinical observations, patient history, and epidemiological information. The expected result is Negative. Fact Sheet for Patients: SugarRoll.be Fact Sheet for Healthcare Providers: https://www.woods-mathews.com/ This test is not yet approved or cleared by the [place] FDA and  has been authorized for detection and/or diagnosis of SARS-CoV-2 by FDA under an Emergency Use Authorization (EUA). This EUA will remain  in effect (meaning this test can be used) for the duration of the COVID-19 declaration under Section 56 4(b)(1) of the Act, 21 U.S.C. section 360bbb-3(b)(1), unless the authorization is terminated or revoked sooner. Performed at [facility], [place] [address] [place], [place] [address]   MRSA PCR Screening     Status: None   Collection Time: 06/14/19  4:25 PM   Specimen: Nasal Mucosa; Nasopharyngeal  Result Value Ref Range Status   MRSA by PCR NEGATIVE NEGATIVE Final    Comment:        The GeneXpert MRSA Assay (FDA approved for NASAL specimens only), is one component of a comprehensive MRSA colonization surveillance program. It is not intended to diagnose MRSA infection nor to guide or monitor treatment  for MRSA infections. Performed at [facility], [place] [address] [place], [place] [address]      RN Pressure Injury Documentation:     Estimated body mass index is 63.77 kg/m as calculated from the following:   Height as of this encounter: 5\' 6"  (1.676 m).   Weight as of this encounter: 179.2 kg.  Malnutrition Type:      Malnutrition  Characteristics:      Nutrition Interventions:     Radiology Studies: DG CHEST PORT 1 VIEW  Result Date: 06/18/2019 CLINICAL DATA:  Shortness of breath. Cellulitis of lower extremity EXAM: PORTABLE CHEST 1 VIEW COMPARISON:  June 13, 2019 FINDINGS: The mediastinal contour is stable. Heart size is enlarged. Increased pulmonary interstitium is identified bilaterally. No focal pneumonia or pleural effusion is identified. The bony structures are stable. IMPRESSION: Congestive heart failure. Electronically Signed   By: Abelardo Diesel M.D.   On: 06/18/2019 11:25   EEG adult  Result Date: 06/17/2019 Lora Havens, MD     06/17/2019  5:12 PM Patient Name: Kara Vincent MRN: [id number] Epilepsy Attending: Lora Havens Referring Physician/Provider: Dr. Osborne Oman Date: 06/17/2019 Duration: 21.55 mins Patient history: 54 year old female with jerking movements in arms.  EEG to evaluate for seizures. Level of alertness: Awake and asleep AEDs during EEG study: None Technical aspects: This EEG study was done with scalp electrodes positioned according to the 10-20 International system of electrode placement. Electrical activity was acquired at a sampling rate of 500Hz  and reviewed with a high frequency filter of 70Hz  and a low frequency filter of 1Hz . EEG data were recorded continuously and digitally stored. Description: The posterior dominant rhythm consists of 8 Hz activity of moderate voltage (25-35 uV) seen predominantly in posterior head regions, symmetric and reactive to eye opening and eye closing. Sleep was characterized by generalized 4-6hz  theta-delta slowing. Hyperventilation and photic stimulation were not performed. IMPRESSION: This study is within normal limits. No seizures or epileptiform discharges were seen throughout the recording. Priyanka Barbra Sarks   Scheduled Meds:  allopurinol  100 mg Oral Daily   ALPRAZolam  0.5 mg Oral TID   arformoterol  15 mcg Nebulization BID   budesonide  (PULMICORT) nebulizer solution  0.25 mg Nebulization BID   cloNIDine  0.2 mg Oral BID   darifenacin  7.5 mg Oral Daily   DULoxetine  30 mg Oral Daily   heparin injection (subcutaneous)  5,000 Units Subcutaneous Q8H   isosorbide mononitrate  30 mg Oral Daily   mouth rinse  15 mL Mouth Rinse BID   omega-3 acid ethyl esters  1 g Oral Daily   pantoprazole  40 mg Oral Daily   polyethylene glycol  17 g Oral BID   senna-docusate  1 tablet Oral BID   triamcinolone cream   Topical BID   vitamin E  100 Units Oral Daily   Continuous Infusions:  sodium chloride 50 mL/hr at 06/18/19 1500   cefTRIAXone (ROCEPHIN)  IV 1 g (06/17/19 2130)    LOS: 4 days   Kerney Elbe, DO [facility] PAGER is on AMION  If 7PM-7AM, please contact night-coverage [email or web address]

## 2019-06-18 NOTE — Progress Notes (Signed)
ABG drawn and sent to lab

## 2019-06-19 ENCOUNTER — Inpatient Hospital Stay (HOSPITAL_COMMUNITY): Payer: Medicaid Other

## 2019-06-19 LAB — COMPREHENSIVE METABOLIC PANEL
ALT: 12 U/L (ref 0–44)
AST: 11 U/L — ABNORMAL LOW (ref 15–41)
Albumin: 2.7 g/dL — ABNORMAL LOW (ref 3.5–5.0)
Alkaline Phosphatase: 53 U/L (ref 38–126)
Anion gap: 6 (ref 5–15)
BUN: 27 mg/dL — ABNORMAL HIGH (ref 6–20)
CO2: 40 mmol/L — ABNORMAL HIGH (ref 22–32)
Calcium: 8.6 mg/dL — ABNORMAL LOW (ref 8.9–10.3)
Chloride: 95 mmol/L — ABNORMAL LOW (ref 98–111)
Creatinine, Ser: 1.32 mg/dL — ABNORMAL HIGH (ref 0.44–1.00)
GFR calc Af Amer: 53 mL/min — ABNORMAL LOW (ref >60)
GFR calc non Af Amer: 45 mL/min — ABNORMAL LOW (ref >60)
Glucose, Bld: 113 mg/dL — ABNORMAL HIGH (ref 70–99)
Potassium: 5.3 mmol/L — ABNORMAL HIGH (ref 3.5–5.1)
Sodium: 141 mmol/L (ref 135–145)
Total Bilirubin: 0.5 mg/dL (ref 0.3–1.2)
Total Protein: 6.5 g/dL (ref 6.5–8.1)

## 2019-06-19 LAB — CBC WITH DIFFERENTIAL/PLATELET
Abs Immature Granulocytes: 0.06 10*3/uL (ref 0.00–0.07)
Basophils Absolute: 0 10*3/uL (ref 0.0–0.1)
Basophils Relative: 0 %
Eosinophils Absolute: 0.3 10*3/uL (ref 0.0–0.5)
Eosinophils Relative: 3 %
HCT: 34.2 % — ABNORMAL LOW (ref 36.0–46.0)
Hemoglobin: 9.7 g/dL — ABNORMAL LOW (ref 12.0–15.0)
Immature Granulocytes: 1 %
Lymphocytes Relative: 16 %
Lymphs Abs: 1.5 10*3/uL (ref 0.7–4.0)
MCH: 29 pg (ref 26.0–34.0)
MCHC: 28.4 g/dL — ABNORMAL LOW (ref 30.0–36.0)
MCV: 102.1 fL — ABNORMAL HIGH (ref 80.0–100.0)
Monocytes Absolute: 0.4 10*3/uL (ref 0.1–1.0)
Monocytes Relative: 4 %
Neutro Abs: 6.9 10*3/uL (ref 1.7–7.7)
Neutrophils Relative %: 76 %
Platelets: 309 10*3/uL (ref 150–400)
RBC: 3.35 MIL/uL — ABNORMAL LOW (ref 3.87–5.11)
RDW: 14 % (ref 11.5–15.5)
WBC: 9.2 10*3/uL (ref 4.0–10.5)
nRBC: 0 % (ref 0.0–0.2)

## 2019-06-19 LAB — IRON AND TIBC
Iron: 32 ug/dL (ref 28–170)
Saturation Ratios: 14 % (ref 10.4–31.8)
TIBC: 234 ug/dL — ABNORMAL LOW (ref 250–450)
UIBC: 202 ug/dL

## 2019-06-19 LAB — RETICULOCYTES
Immature Retic Fract: 23.8 % — ABNORMAL HIGH (ref 2.3–15.9)
RBC.: 3.37 MIL/uL — ABNORMAL LOW (ref 3.87–5.11)
Retic Count, Absolute: 43.8 10*3/uL (ref 19.0–186.0)
Retic Ct Pct: 1.3 % (ref 0.4–3.1)

## 2019-06-19 LAB — VITAMIN B12: Vitamin B-12: 406 pg/mL (ref 180–914)

## 2019-06-19 LAB — FERRITIN: Ferritin: 137 ng/mL (ref 11–307)

## 2019-06-19 LAB — PHOSPHORUS: Phosphorus: 3 mg/dL (ref 2.5–4.6)

## 2019-06-19 LAB — FOLATE: Folate: 5.7 ng/mL — ABNORMAL LOW (ref >5.9)

## 2019-06-19 LAB — MAGNESIUM: Magnesium: 2.1 mg/dL (ref 1.7–2.4)

## 2019-06-19 MED ORDER — SODIUM ZIRCONIUM CYCLOSILICATE 10 G PO PACK
10.0000 g | PACK | Freq: Once | ORAL | Status: AC
  Administered 2019-06-19: 10 g via ORAL
  Filled 2019-06-19: qty 1

## 2019-06-19 NOTE — Progress Notes (Signed)
PROGRESS NOTE    Kara Vincent  JWJ:[id number] DOB: 1965/06/28 DOA: 06/13/2019 PCP: Ladell Pier, MD  Brief Narrative: HPI per Dr. Gean Birchwood on 06/14/19 Kara Vincent is a 54 y.o. female with care morbid obesity asthma chronic kidney disease stage III hypertension gout presents to the ER because of increasing pain and swelling of the both lower extremities more on the right side.  This has been ongoing for last 1 week.  Patient states she also got more short of breath than usual.  Has some pleuritic type of chest pain denies any productive cough or fever or chills.  Patient in addition also has been having some frontal headache with no focal deficits.  Headache has been present for last few hours.  ED Course: In the ER on exam patient has bilateral lower extremity erythema and swelling of both legs more on the right side.  No signs of any compartment syndrome.  The swelling extends from the ankle up to the mid calf.  Patient also was mildly hypoxic usually uses chronic oxygen at home.  Has not been using her CPAP recently because she lost it.  Chest x-ray shows nonspecific findings for possible opacity.  CT angiogram was unremarkable.  Labs show creatinine 1.8 BNP 120 lactic acid 1.8 hemoglobin 10.6 WBC 11.4 x-ray of the both left-side and right side shows edema.  EKG shows normal sinus rhythm with low voltage.  **Interim History Cellulitis is improving somewhat.  Patient complaining some nausea now.  Still wearing oxygen but wears 3 L at baseline.  Have ordered her CPAP for the night but she is refused.  She apparently has bedbugs so we will give her permethrin 5% cream apply topically from the neck down.  Cellulitis appears to be improving.  Patient was little somnolent but easily arousable and states that she is feeling a little bit better yesterday but she is more somnolent again today and a little worse.  She started having some tremors and some twitching.  Neurology was  consulted and an ABG was ordered and she was found to have acute respiratory failure with hypercarbia and hypoxia and she was transferred to the stepdown unit for BiPAP given her PCO2 of 95.  Neurology worked the patient up for twitching but thinks it is secondary to her hypercarbia  06/18/27: Overnight the patient was intermittently wearing BiPAP and this morning she was more hypercarbic and somnolent and agitated.  Because of her worsened ABG critical care was consulted and Dr. Tamala Julian recommended to continue BiPAP nightly and as needed during the day and recommends monitoring her closely.  Her CO2 did improve some after she did wear and will need to continue monitor her carefully.  She has been intermittently agitated and intermittently noncompliant with being off BiPAP throughout the day and has a high risk for decompensation.  06/19/27: Complaining of some right leg pain but does appear better with consistent BiPAP usage.  Will need social work help to see if she can get reliable nephrology for an NIV; pulmonary thinks that she will have continued hospitalizations due to lack of NIV.  She thinks she is a little bit better today and pulmonary recommending continue nebs and low-dose Xanax as needed  Assessment & Plan:   Principal Problem:   Cellulitis of both lower extremities Active Problems:   Asthma exacerbation   CKD (chronic kidney disease) stage 3, GFR 30-59 ml/min (HCC)   OSA (obstructive sleep apnea)   Essential hypertension   Gout  Morbid obesity ([place])   Acute respiratory failure with hypoxia (HCC)   Cellulitis  Cellulitis of the Right Lower Extremity, improving somewhat -for which patient is on empiric antibiotics with IV Ceftriaxone.  And will continue -Will check Dopplers to rule out DVT and showed "No evidence of deep vein thrombosis seen in the lower extremities, bilaterally." -No signs of any compartment syndrome. -WBC has been hovering around 11 and 12 but yesterday was 13.1  and today is improved at 9.9 -Lactic acid level was 0.7 -IV fluid hydration is now stopped and creatinine is better -Elevate Extremity -Continue monitor temperature curve and continue monitor WBC -this is improving  Acute on chronic respiratory failure hypoxia and now hypercarbia could be from asthma versus possible fluid likely in the setting of ? Diastolic CHF  OSA and obesity hypoventilation syndrome.   -BNP was mildly elevated at 120.9 -CT angio was without PE -She wears 3 L of supplemental oxygen at home; now requiring BiPAP nightly as well as as needed -repeat ABG was better as below -We will need to blow off her CO2 and will attempt BiPAP.  BiPAP was initiated yesterday and patient has been intermittently compliant with it overnight however her ABG worsened this morning with a PCO2 over 100 and her being agitated next-pulmonary is consulted and they recommended continue observation for now continue with BiPAP -Patient states she takes Lasix 40 mg twice daily and this was continued but we will stop given her worsening renal function and hyperkalemia for now and will start IV fluid hydration this is now also stopped -This is likely multifactorial probably due to OSA and obesity hypoventilation syndrome -Closely monitor continued pulmonary toilet and continued monitoring of her respiratory status -Repeat chest x-ray in a.m. and appreciate pulmonary evaluating her -Continue CPAP at night and ensure compliance -We will need an ambulatory home O2 screen prior to discharge -BiPAP nightly and as needed and encourage compliance -pulmonary recommends figure out a long-term if she is arousable enough housing for an NIV -Most recent ABG was 7.302 pH, PCO2 of 81.0, PO2 of 74.8, bicarbonate level 39.4, ABG O2 saturation 95.4% on 40% FiO2 -Pete chest x-ray in the a.m. and continue with pulmonary nebs and BiPAP at night and during the day  Twitching and myoclonic jerking, improved -Neurology  consulted  -Ammonia level was 20 -Neurology feels this is secondary to hypercarbia but they are ruling out other etiologies next-head CT was ordered as well as an EEG; EEG had no evidence of any seizure activity -Permethrin cream has been stopped and will reduce her hydroxyzine -Continue to monitor and likely this is secondary to her hypercarbia and this is definitely improved  Hypertension  -Continue with clonidine, Imdur but will hold her lisinopril for now -Blood pressure is now 110/69  Chronic kidney disease stage IIIb worsening  Hyperphosphatemia, improving  -Creatinine is appear to be baseline but however slightly worsened today given Lasix usage now is further worsened and it is ended up at 30/1.98; today it is improved is 27/1.32 -Patient's phosphorus level was 4.3 and today is 3.0 -Closely monitor closely since patient is on Lasix and lisinopril.;  Will hold her Lasix and hold her lisinopril given her hyperkalemia -Avoid further nephrotoxic medications, contrast dyes, hypotension and renally dose medications -We will start gentle IV fluid hydration at 50 cc/h and this was increased to 75 MLS per hour we will stop and monitor off of fluids -Repeat CMP in a.m. 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 -Patient's hemoglobin/hematocrit was 10.6/37.5 today is 9.7/34.2 -Checked anemia panel and showed an iron level of 32, U IBC of 2 2, TIBC of 234, saturation of 14%, ferritin level 137, folate level 5.7, and vitamin B12 level 406 -Continue to monitor for signs and symptoms of bleeding; currently no overt bleeding noted -Repeat CBC in a.m.  Hyperkalemia -Mild as patient's potassium is 5.3 -Continue to monitor and trend -We will stop the Lasix and hold her lisinopril and started gentle IV fluid hydration and give a dose of Lokelma again today -IVF increased initially but now has been stopped -Repeat  CMP in a.m.  History of Gout  -C/w Allopurinol for now but may need to hold   History of Sleep Apnea  -Has not been compliant due to patient recently lost her CPAP.   -CPAP at bedtime ordered but she refused; -We will place on BiPAP she has been intermittently night and continues to pull it off -Has been more consistent with her BiPAP usage  -?PNA -Mild il-defined opacity on the left side of the lung field on chest x-ray.   -Repeat chest x-ray this AM showed "The mediastinal contour is stable. Heart size is enlarged. Increased pulmonary interstitium is identified bilaterally. No focal pneumonia or pleural effusion is identified. The bony structures are stable." -Continue with albuterol 2.5 mg IH every 4 hours as needed for wheezing or shortness of breath -Continue with BiPAP if she allows -Chest x-ray yesterday was read as CHF however we will need to monitor her given that it is very difficult to assess her clinical volume status given her significant obesity; we will stop her IV fluid hydration currently and monitor off of IV fluids -Repeat CXR this AM showed "Bilateral interstitial prominence. Lingular airspace disease which may reflect atelectasis versus pneumonia. No pleural effusion or pneumothorax. Stable cardiomediastinal silhouette. No aggressive osseous lesion." It was also read as "Cardiomegaly with pulmonary vascular congestion." -Patient is already on IV Ceftriaxone; May Consider Adding Azithormycin -Repeat CXR in AM and may give IV Diuresis in AM if continues to have pulmonary vascular congestion   HLD -C/w omega-3 acid ethyl esters  GERD -Continue with Pantoprazole 40 p.o. daily  Super Morbid Obesity -Estimated body mass index is 63.77 kg/m as calculated from the following:   Height as of this encounter: 5\' 6"  (1.676 m).   Weight as of this encounter: 179.2 kg. -Weight Loss and Dietary Counseling given   Chronic Pain -Continue with Tylenol 3 p.o. every 8 hours and  Percocet 2 tabs p.o. every 6 as needed -avoid further narcotic medications given somnolence -We will give her IV fentanyl while she is n.p.o. wearing the BiPAP  Constipation -DG Abd showed "Air distention of nondescript bowel loop within the right hemiabdomen, nonspecific. Large stool burden within the right colon and distal colon/rectum." -Start the patient on adequate bowel regimen and try suppository  Bedbugs -Recently exposed in a motel that had bedbugs with mattress from infested -Recommend washing all belongings in extremely hot water if cannot wash belongings and place him in a trash bag and leave him for 1 week -Apply permethrin cream however will not reapply given that she ended up having some jerking and do not know if this is from the hypercarbia or a permethrin cream- -Continue with contact precautions  DVT prophylaxis: Enoxaparin 40 mg sq q24h Code Status: FULL CODE Family Communication: No family present at bedside  Disposition Plan: Pending further clinical improvement  of her cellulitis and nausea.  Patient is from home and requiring IV antibiotics for her cellulitis  Consultants:   Pulmonary  Neurology  Procedures:  LE VENOUS DUPLEX BILATERAL:  - No evidence of deep vein thrombosis seen in the lower extremities,  bilaterally.   Antimicrobials:  Anti-infectives (From admission, onward)   Start     Dose/Rate Route Frequency Ordered Stop   06/14/19 2000  cefTRIAXone (ROCEPHIN) 1 g in sodium chloride 0.9 % 100 mL IVPB     1 g 200 mL/hr over 30 Minutes Intravenous Every 24 hours 06/14/19 0756     06/14/19 0430  vancomycin (VANCOREADY) IVPB 2000 mg/400 mL     2,000 mg 200 mL/hr over 120 Minutes Intravenous  Once 06/14/19 0430 06/14/19 0700   06/14/19 0400  cefTRIAXone (ROCEPHIN) 1 g in sodium chloride 0.9 % 100 mL IVPB     1 g 200 mL/hr over 30 Minutes Intravenous  Once 06/14/19 0346 06/14/19 0450     Subjective: Seen and examined at bedside and she was a  little bit more improved today.  Was able to wear her BiPAP at night and her mentation was better.  Still complaining of some right leg pain.  No chest pain, lightheadedness or dizziness but also had some neck pain when a heating pad.  Feels okay.  Objective: Vitals:   06/19/19 0757 06/19/19 0845 06/19/19 1341 06/19/19 1740  BP:  107/76 106/62 110/69  Pulse: 66 70 82 87  Resp: 13 17 18  (!) 22  Temp:  97.7 F (36.5 C) 98.2 F (36.8 C) 97.6 F (36.4 C)  TempSrc:  Axillary Oral Axillary  SpO2: 98% 97% 95% (!) 89%  Weight:      Height:        Intake/Output Summary (Last 24 hours) at 06/19/2019 1922 Last data filed at 06/19/2019 1228 Gross per 24 hour  Intake 220 ml  Output 700 ml  Net -480 ml   Filed Weights   06/14/19 1400 06/16/19 2129  Weight: (!) 182.8 kg (!) 179.2 kg   Examination: Physical Exam:  Constitutional: WN/WD super morbidly obese Caucasian female currently off of the BiPAP and is calmer this morning but still anxious and she is not as confused and agitated but is complaining of some leg pain Eyes: Lids and conjunctivae normal, sclerae anicteric  ENMT: External Ears, Nose appear normal. Grossly normal hearing. Mucous membranes are moist.  Neck: Appears normal, supple, no cervical masses, normal ROM, no appreciable thyromegaly; no JVD Respiratory: Diminished to auscultation bilaterally with coarse breath sounds and some mild crackles, no wheezing, rales, rhonchi or crackles.  Wearing supplemental oxygen via nasal cannula and off of the BiPAP for now Cardiovascular: RRR, no murmurs / rubs / gallops. S1 and S2 auscultated.  Has trace to 1+ lower extremity nonpitting edema  Abdomen: Soft, non-tender, distended secondary body habitus. Bowel sounds positive.  GU: Deferred. Musculoskeletal: No clubbing / cyanosis of digits/nails. No joint deformity upper and lower extremities.  Skin: Has multiple bug bites and her right leg cellulitis is much improved since the first day is  not as erythematous.  Has bug bites on her face arms and legs and lesions from scratching. Neurologic: CN 2-12 grossly intact with no focal deficits. Romberg sign and cerebellar reflexes not assessed.  Psychiatric: Normal judgment and insight. Alert and oriented x 3. Anxious mood slightly and appropriate affect.   Data Reviewed: I have personally reviewed following labs and imaging studies  CBC: Recent Labs  Lab 06/15/19  7619 06/16/19 0326 06/17/19 0531 06/18/19 0353 06/19/19 0207  WBC 11.1* 12.4* 13.1* 9.9 9.2  NEUTROABS 9.0* 10.0* 10.4* 7.3 6.9  HGB 11.1* 11.3* 10.6* 9.8* 9.7*  HCT 38.4 39.0 37.5 34.7* 34.2*  MCV 100.5* 100.3* 103.9* 103.0* 102.1*  PLT 312 350 365 323 509   Basic Metabolic Panel: Recent Labs  Lab 06/15/19 0950 06/16/19 0326 06/17/19 0531 06/18/19 0353 06/19/19 0207  NA 142 141 142 142 141  K 5.3* 4.9 5.6* 5.2* 5.3*  CL 100 98 95* 97* 95*  CO2 32 34* 36* 37* 40*  GLUCOSE 112* 125* 111* 96 113*  BUN 21* 22* 27* 30* 27*  CREATININE 1.54* 1.40* 1.66* 1.98* 1.32*  CALCIUM 8.9 8.7* 8.8* 8.4* 8.6*  MG 1.9 1.7 1.8 1.7 2.1  PHOS 4.9* 4.8* 4.3 4.2 3.0   GFR: Estimated Creatinine Clearance: 81.6 mL/min (A) (by C-G formula based on SCr of 1.32 mg/dL (H)). Liver Function Tests: Recent Labs  Lab 06/15/19 0950 06/16/19 0326 06/17/19 0531 06/18/19 0353 06/19/19 0207  AST 12* 13* 10* 9* 11*  ALT 15 15 14 12 12   ALKPHOS 70 72 64 54 53  BILITOT 0.5 0.5 0.3 0.5 0.5  PROT 7.3 7.5 7.2 6.3* 6.5  ALBUMIN 3.0* 3.0* 3.0* 2.7* 2.7*   No results for input(s): LIPASE, AMYLASE in the last 168 hours. Recent Labs  Lab 06/17/19 1441  AMMONIA 20   Coagulation Profile: No results for input(s): INR, PROTIME in the last 168 hours. Cardiac Enzymes: No results for input(s): CKTOTAL, CKMB, CKMBINDEX, TROPONINI in the last 168 hours. BNP (last 3 results) No results for input(s): PROBNP in the last 8760 hours. HbA1C: No results for input(s): HGBA1C in the last 72  hours. CBG: Recent Labs  Lab 06/17/19 0643 06/17/19 1145  GLUCAP 100* 113*   Lipid Profile: No results for input(s): CHOL, HDL, LDLCALC, TRIG, CHOLHDL, LDLDIRECT in the last 72 hours. Thyroid Function Tests: No results for input(s): TSH, T4TOTAL, FREET4, T3FREE, THYROIDAB in the last 72 hours. Anemia Panel: Recent Labs    06/19/19 0207  VITAMINB12 406  FOLATE 5.7*  FERRITIN 137  TIBC 234*  IRON 32  RETICCTPCT 1.3   Sepsis Labs: Recent Labs  Lab 06/13/19 1902 06/14/19 0044  LATICACIDVEN 0.8 0.7    Recent Results (from the past 240 hour(s))  SARS CORONAVIRUS 2 (TAT 6-24 HRS) Nasopharyngeal Nasopharyngeal Swab     Status: None   Collection Time: 06/14/19  5:15 AM   Specimen: Nasopharyngeal Swab  Result Value Ref Range Status   SARS Coronavirus 2 NEGATIVE NEGATIVE Final    Comment: (NOTE) SARS-CoV-2 target nucleic acids are NOT DETECTED. The SARS-CoV-2 RNA is generally detectable in upper and lower respiratory specimens during the acute phase of infection. Negative results do not preclude SARS-CoV-2 infection, do not rule out co-infections with other pathogens, and should not be used as the sole basis for treatment or other patient management decisions. Negative results must be combined with clinical observations, patient history, and epidemiological information. The expected result is Negative. Fact Sheet for Patients: SugarRoll.be Fact Sheet for Healthcare Providers: https://www.woods-mathews.com/ This test is not yet approved or cleared by the [place] FDA and  has been authorized for detection and/or diagnosis of SARS-CoV-2 by FDA under an Emergency Use Authorization (EUA). This EUA will remain  in effect (meaning this test can be used) for the duration of the COVID-19 declaration under Section 56 4(b)(1) of the Act, 21 U.S.C. section 360bbb-3(b)(1), unless the authorization is terminated or  revoked  sooner. Performed at [facility], [place] [address] [place], [place] [address]   MRSA PCR Screening     Status: None   Collection Time: 06/14/19  4:25 PM   Specimen: Nasal Mucosa; Nasopharyngeal  Result Value Ref Range Status   MRSA by PCR NEGATIVE NEGATIVE Final    Comment:        The GeneXpert MRSA Assay (FDA approved for NASAL specimens only), is one component of a comprehensive MRSA colonization surveillance program. It is not intended to diagnose MRSA infection nor to guide or monitor treatment for MRSA infections. Performed at [facility], [place] [address], [place], [place] [address]      RN Pressure Injury Documentation:     Estimated body mass index is 63.77 kg/m as calculated from the following:   Height as of this encounter: 5\' 6"  (1.676 m).   Weight as of this encounter: 179.2 kg.  Malnutrition Type:      Malnutrition Characteristics:      Nutrition Interventions:     Radiology Studies: DG CHEST PORT 1 VIEW  Result Date: 06/19/2019 CLINICAL DATA:  Shortness of breath EXAM: PORTABLE CHEST 1 VIEW COMPARISON:  06/18/2019 FINDINGS: Bilateral interstitial prominence. Lingular airspace disease which may reflect atelectasis versus pneumonia. No pleural effusion or pneumothorax. Stable cardiomediastinal silhouette. No aggressive osseous lesion. IMPRESSION: Lingular airspace disease which may reflect atelectasis versus pneumonia. Cardiomegaly with pulmonary vascular congestion. Electronically Signed   By: Kathreen Devoid   On: 06/19/2019 07:58   DG CHEST PORT 1 VIEW  Result Date: 06/18/2019 CLINICAL DATA:  Shortness of breath. Cellulitis of lower extremity EXAM: PORTABLE CHEST 1 VIEW COMPARISON:  June 13, 2019 FINDINGS: The mediastinal contour is stable. Heart size is enlarged. Increased pulmonary interstitium is identified bilaterally. No focal pneumonia or pleural effusion is identified. The bony structures are stable. IMPRESSION: Congestive heart  failure. Electronically Signed   By: Abelardo Diesel M.D.   On: 06/18/2019 11:25   Scheduled Meds: . allopurinol  100 mg Oral Daily  . ALPRAZolam  0.5 mg Oral TID  . arformoterol  15 mcg Nebulization BID  . budesonide (PULMICORT) nebulizer solution  0.25 mg Nebulization BID  . cloNIDine  0.2 mg Oral BID  . darifenacin  7.5 mg Oral Daily  . DULoxetine  30 mg Oral Daily  . heparin injection (subcutaneous)  5,000 Units Subcutaneous Q8H  . isosorbide mononitrate  30 mg Oral Daily  . mouth rinse  15 mL Mouth Rinse BID  . omega-3 acid ethyl esters  1 g Oral Daily  . pantoprazole  40 mg Oral Daily  . polyethylene glycol  17 g Oral BID  . senna-docusate  1 tablet Oral BID  . triamcinolone cream   Topical BID  . vitamin E  100 Units Oral Daily   Continuous Infusions: . cefTRIAXone (ROCEPHIN)  IV Stopped (06/19/19 0700)    LOS: 5 days   Kerney Elbe, DO [facility] PAGER is on AMION  If 7PM-7AM, please contact night-coverage [email or web address]

## 2019-06-19 NOTE — Progress Notes (Signed)
Acknowledging consult for medication assistance. Patient has medicaid and pays $3 per med.   Manya Silvas, RN MSN CCM Transitions of Care  [phone]

## 2019-06-19 NOTE — Consult Note (Signed)
   NAME:  Kara Vincent, MRN:  [id number], DOB:  05/03/1965, LOS: 5 ADMISSION DATE:  06/13/2019, CONSULTATION DATE:  06/18/19 REFERRING MD:  Alfredia Ferguson, CHIEF COMPLAINT:  SOB   Brief History   54 year old woman with hx of morbid obesity, chronic pain/anxiety presenting with acute on chronic hypercarbia.  History of present illness   54 year old woman nonsmoker with hx of morbid obesity, chronic pain/anxiety presenting with acute on chronic hypercarbia.  Has a presumed diagnosis of lower ext cellulitis. She does not like wearing BIPAP but eventually was coerced into wearing it a few hours this AM.  ABG shows worsening retention.  PCCM consulted for further recs.  Patient is anxious and requesting meds by name for this.  Past Medical History  OSA w/o CPAP, chronic O2 need, OHS, homeless, "asthma", hidranitis, anxiety, chronic pain  Significant Hospital Events   3/23 admitted  Consults:  PCCM  Procedures:  N/A  Significant Diagnostic Tests:  CTA chest- obese LE doppler neg   Micro Data:  COVID neg HIV neg MRSA swab neg  Antimicrobials:  Ceftriaxone 3/22>>  Interim history/subjective:  Anxious  Objective   Blood pressure 107/76, pulse 70, temperature 97.7 F (36.5 C), temperature source Axillary, resp. rate 17, height 5\' 6"  (1.676 m), weight (!) 179.2 kg, last menstrual period 03/24/2009, SpO2 97 %.    FiO2 (%):  [40 %] 40 %   Intake/Output Summary (Last 24 hours) at 06/19/2019 1101 Last data filed at 06/19/2019 0700 Gross per 24 hour  Intake 598.59 ml  Output --  Net 598.59 ml   Filed Weights   06/14/19 1400 06/16/19 2129  Weight: (!) 182.8 kg (!) 179.2 kg    Examination: GEN: obese woman on [place] HEENT: malampatti 4, trachea midline CV: RRR, ext warm PULM: diminished bases, no wheezing GI: Soft, +BS EXT: No edema, just adipose tissue, not seeing too much cellulitis evidence, lots of scratches c/w bedbugs NEURO: moves all 4 ext to command PSYCH: RASS 0, more  loquacious today SKIN: multiple scabs over lower ext   Resolved Hospital Problem list   N/A  Assessment & Plan:  Acute on chronic hypercarbic respiratory failure, underlying OSA/OHS, 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  Long-term need to figure out if she has reliable enough housing for a NIV. SW may be helpful here.  Without this I expect her to not stay out of hospital. - Continue nebs - Fine for low dose xanax PRN as OP from my standpoint - Continue BIPAP at night and PRN - Reorient as able - Possible cellulitis management per primary  Erskine Emery MD PCCM

## 2019-06-20 ENCOUNTER — Inpatient Hospital Stay (HOSPITAL_COMMUNITY): Payer: Medicaid Other

## 2019-06-20 ENCOUNTER — Telehealth: Payer: Self-pay | Admitting: Pulmonary Disease

## 2019-06-20 DIAGNOSIS — R0602 Shortness of breath: Secondary | ICD-10-CM

## 2019-06-20 DIAGNOSIS — I5033 Acute on chronic diastolic (congestive) heart failure: Secondary | ICD-10-CM

## 2019-06-20 LAB — COMPREHENSIVE METABOLIC PANEL
ALT: 11 U/L (ref 0–44)
AST: 11 U/L — ABNORMAL LOW (ref 15–41)
Albumin: 2.8 g/dL — ABNORMAL LOW (ref 3.5–5.0)
Alkaline Phosphatase: 51 U/L (ref 38–126)
Anion gap: 10 (ref 5–15)
BUN: 21 mg/dL — ABNORMAL HIGH (ref 6–20)
CO2: 38 mmol/L — ABNORMAL HIGH (ref 22–32)
Calcium: 8.6 mg/dL — ABNORMAL LOW (ref 8.9–10.3)
Chloride: 95 mmol/L — ABNORMAL LOW (ref 98–111)
Creatinine, Ser: 1.28 mg/dL — ABNORMAL HIGH (ref 0.44–1.00)
GFR calc Af Amer: 54 mL/min — ABNORMAL LOW (ref >60)
GFR calc non Af Amer: 47 mL/min — ABNORMAL LOW (ref >60)
Glucose, Bld: 118 mg/dL — ABNORMAL HIGH (ref 70–99)
Potassium: 5.1 mmol/L (ref 3.5–5.1)
Sodium: 143 mmol/L (ref 135–145)
Total Bilirubin: 0.7 mg/dL (ref 0.3–1.2)
Total Protein: 6.7 g/dL (ref 6.5–8.1)

## 2019-06-20 LAB — PHOSPHORUS: Phosphorus: 3.3 mg/dL (ref 2.5–4.6)

## 2019-06-20 LAB — CBC WITH DIFFERENTIAL/PLATELET
Abs Immature Granulocytes: 0.09 10*3/uL — ABNORMAL HIGH (ref 0.00–0.07)
Basophils Absolute: 0.1 10*3/uL (ref 0.0–0.1)
Basophils Relative: 1 %
Eosinophils Absolute: 0.3 10*3/uL (ref 0.0–0.5)
Eosinophils Relative: 3 %
HCT: 35 % — ABNORMAL LOW (ref 36.0–46.0)
Hemoglobin: 9.9 g/dL — ABNORMAL LOW (ref 12.0–15.0)
Immature Granulocytes: 1 %
Lymphocytes Relative: 21 %
Lymphs Abs: 2 10*3/uL (ref 0.7–4.0)
MCH: 28.8 pg (ref 26.0–34.0)
MCHC: 28.3 g/dL — ABNORMAL LOW (ref 30.0–36.0)
MCV: 101.7 fL — ABNORMAL HIGH (ref 80.0–100.0)
Monocytes Absolute: 0.4 10*3/uL (ref 0.1–1.0)
Monocytes Relative: 4 %
Neutro Abs: 6.8 10*3/uL (ref 1.7–7.7)
Neutrophils Relative %: 70 %
Platelets: 324 10*3/uL (ref 150–400)
RBC: 3.44 MIL/uL — ABNORMAL LOW (ref 3.87–5.11)
RDW: 14.1 % (ref 11.5–15.5)
WBC: 9.7 10*3/uL (ref 4.0–10.5)
nRBC: 0 % (ref 0.0–0.2)

## 2019-06-20 LAB — ECHOCARDIOGRAM COMPLETE
Height: 66 in
Weight: 6321.03 oz

## 2019-06-20 LAB — MAGNESIUM: Magnesium: 1.9 mg/dL (ref 1.7–2.4)

## 2019-06-20 MED ORDER — KETOCONAZOLE 2 % EX SHAM
MEDICATED_SHAMPOO | Freq: Once | CUTANEOUS | Status: AC
  Filled 2019-06-20: qty 120

## 2019-06-20 MED ORDER — CARBAMIDE PEROXIDE 6.5 % OT SOLN
5.0000 [drp] | Freq: Two times a day (BID) | OTIC | Status: DC
  Administered 2019-06-20 – 2019-06-30 (×20): 5 [drp] via OTIC
  Filled 2019-06-20 (×2): qty 15

## 2019-06-20 MED ORDER — FUROSEMIDE 10 MG/ML IJ SOLN
40.0000 mg | Freq: Once | INTRAMUSCULAR | Status: AC
  Administered 2019-06-20: 40 mg via INTRAVENOUS
  Filled 2019-06-20: qty 4

## 2019-06-20 NOTE — Progress Notes (Signed)
   NAME:  Kara Vincent, MRN:  [id number], DOB:  [birth year], LOS: 6 ADMISSION DATE:  06/13/2019, CONSULTATION DATE:  06/18/2019 REFERRING MD:  Alfredia Ferguson CHIEF COMPLAINT:  SOB   Brief History   54 year old woman with hx of morbid obesity, chronic pain/anxiety presenting with acute on chronic hypercarbia.  History of present illness   54 year old woman nonsmoker with hx of morbid obesity, chronic pain/anxiety presenting with acute on chronic hypercarbia.  Has a presumed diagnosis of lower ext cellulitis. She does not like wearing BIPAP but eventually was coerced into wearing it a few hours this AM.  ABG shows worsening retention.  PCCM consulted for further recs.  Patient is anxious and requesting meds by name for this.  Past Medical History  OSA w/o CPAP, chronic O2 need, OHS, homeless, "asthma", hidranitis, anxiety, chronic pain  Significant Hospital Events   3/23 admitted  Consults:  PCCM Procedures:  None  Significant Diagnostic Tests:  CTA chest- obese LE doppler neg   Micro Data:  COVID neg HIV neg MRSA swab neg  Antimicrobials:  Ceftriaxone 3/22>>  Interim history/subjective:  No overnight events Pruritus  Objective   Blood pressure 125/60, pulse (!) 58, temperature 98.4 F (36.9 C), temperature source Oral, resp. rate (!) 21, height 5\' 6"  (1.676 m), weight (!) 179.2 kg, last menstrual period 03/24/2009, SpO2 100 %.    FiO2 (%):  [40 %] 40 %   Intake/Output Summary (Last 24 hours) at 06/20/2019 1149 Last data filed at 06/20/2019 0700 Gross per 24 hour  Intake 360 ml  Output 1300 ml  Net -940 ml   Filed Weights   06/14/19 1400 06/16/19 2129  Weight: (!) 182.8 kg (!) 179.2 kg    Examination: General: Morbidly obese HENT: Mallampati 4 Lungs: Clear breath sounds bilaterally Cardiovascular: S1-S2 appreciated Abdomen: Soft, bowel sounds appreciated  Resolved Hospital Problem list     Assessment & Plan:  Acute on chronic hypercarbic respiratory  failure Obesity hypoventilation syndrome Obstructive sleep apnea  Trying to tolerate BiPAP Continue medications for anxiety  Cellulitis management per primary  May benefit from BiPAP use long-term  Involve social services as patient is currently homeless  Sherrilyn Rist, MD [place] [facility] Pager: [phone]

## 2019-06-20 NOTE — Progress Notes (Signed)
The chaplain visited with the patient as a result of a consult from the nurse. The chaplain provided empathetic listening and prayer. The patient expressed frustration at having her medical condition after being bitten by bed bugs. The patient also expressed that she desired to have paperwork done so that she can file litigation against the motel that she was living at. The patient asked the chaplain if he knew anywhere that was currently renting but the chaplain was not able to assist in this area. The chaplain does not assess a need to follow-up with this patient and the patient was not able to articulate a need for a POA.  Brion Aliment Chaplain Resident For questions concerning this note please contact me by pager [phone]

## 2019-06-20 NOTE — Progress Notes (Signed)
Patient indicated that she prefers to use her [place] overnight rather than the bipap.

## 2019-06-20 NOTE — Progress Notes (Signed)
Pt wore BIPAP for most of the shift.

## 2019-06-20 NOTE — Progress Notes (Signed)
PROGRESS NOTE    Kara Vincent  RCV:[id number] DOB: 11/03/1965 DOA: 06/13/2019 PCP: Ladell Pier, MD  Brief Narrative: HPI per Dr. Gean Birchwood on 06/14/19 Kara Vincent is a 54 y.o. female with care morbid obesity asthma chronic kidney disease stage III hypertension gout presents to the ER because of increasing pain and swelling of the both lower extremities more on the right side.  This has been ongoing for last 1 week.  Patient states she also got more short of breath than usual.  Has some pleuritic type of chest pain denies any productive cough or fever or chills.  Patient in addition also has been having some frontal headache with no focal deficits.  Headache has been present for last few hours.  ED Course: In the ER on exam patient has bilateral lower extremity erythema and swelling of both legs more on the right side.  No signs of any compartment syndrome.  The swelling extends from the ankle up to the mid calf.  Patient also was mildly hypoxic usually uses chronic oxygen at home.  Has not been using her CPAP recently because she lost it.  Chest x-ray shows nonspecific findings for possible opacity.  CT angiogram was unremarkable.  Labs show creatinine 1.8 BNP 120 lactic acid 1.8 hemoglobin 10.6 WBC 11.4 x-ray of the both left-side and right side shows edema.  EKG shows normal sinus rhythm with low voltage.  **Interim History Cellulitis is improving somewhat.  Patient complaining some nausea now.  Still wearing oxygen but wears 3 L at baseline.  Have ordered her CPAP for the night but she is refused.  She apparently has bedbugs so we will give her permethrin 5% cream apply topically from the neck down.  Cellulitis appears to be improving.  Patient was little somnolent but easily arousable and states that she is feeling a little bit better yesterday but she is more somnolent again today and a little worse.  She started having some tremors and some twitching.  Neurology was  consulted and an ABG was ordered and she was found to have acute respiratory failure with hypercarbia and hypoxia and she was transferred to the stepdown unit for BiPAP given her PCO2 of 95.  Neurology worked the patient up for twitching but thinks it is secondary to her hypercarbia  06/18/19: Overnight the patient was intermittently wearing BiPAP and this morning she was more hypercarbic and somnolent and agitated.  Because of her worsened ABG critical care was consulted and Dr. Tamala Julian recommended to continue BiPAP nightly and as needed during the day and recommends monitoring her closely.  Her CO2 did improve some after she did wear and will need to continue monitor her carefully.  She has been intermittently agitated and intermittently noncompliant with being off BiPAP throughout the day and has a high risk for decompensation.  06/19/19: Complaining of some right leg pain but does appear better with consistent BiPAP usage.  Will need social work help to see if she can get reliable nephrology for an NIV; pulmonary thinks that she will have continued hospitalizations due to lack of NIV.  She thinks she is a little bit better today and pulmonary recommending continue nebs and low-dose Xanax as needed  06/20/19: Complaining of some right leg pain.  Chest x-ray reveals more of pulmonary vascular congestion volume overload so we will start diuresis and check an echocardiogram given that she does have history of congestive heart failure which is diastolic in nature.  Assessment & Plan:  Principal Problem:   Cellulitis of both lower extremities Active Problems:   Asthma exacerbation   CKD (chronic kidney disease) stage 3, GFR 30-59 ml/min (HCC)   OSA (obstructive sleep apnea)   Essential hypertension   Gout   Morbid obesity ([place])   Acute respiratory failure with hypoxia (HCC)   Cellulitis  Cellulitis of the Right Lower Extremity, improving somewhat -for which patient is on empiric antibiotics with IV  Ceftriaxone.  And will continue -Will check Dopplers to rule out DVT and showed "No evidence of deep vein thrombosis seen in the lower extremities, bilaterally." -No signs of any compartment syndrome. -WBC has been hovering around 11 and 12 but yesterday was 13.1 and today is improved at 9.9 -Lactic acid level was 0.7 -IV fluid hydration is now stopped and creatinine is better -Elevate Extremity -Continue monitor temperature curve and continue monitor WBC -this is improving  Acute on chronic respiratory failure hypoxia and now hypercarbia could be from asthma versus possible fluid likely in the setting of acute on chronic diastolic CHF,  OSA and obesity hypoventilation syndrome.   -BNP was mildly elevated at 120.9 on admission -CT angio was without PE -She wears 3 L of supplemental oxygen at home; now requiring BiPAP nightly as well as as needed -Repeat ABG was better as below -We will need to blow off her CO2 and will attempt BiPAP.  BiPAP was initiated with patient has been intermittently compliant with it and a few days ago her PCO2 was greater than 100 -Patient was taking p.o. Lasix at home and she is diuresed and stopped and given IV fluids however this was stopped given her chest x-ray yesterday.  Chest x-ray today looks a little worse so we will start back diuresis and check echocardiogram -This is likely multifactorial probably due to OSA and obesity hypoventilation syndrome -Closely monitor continued pulmonary toilet and continued monitoring of her respiratory status -Continue CPAP at night and ensure compliance -We will need an ambulatory home O2 screen prior to discharge -BiPAP nightly and as needed and encourage compliance -pulmonary recommends figure out a long-term if she is arousable enough housing for an NIV -Most recent ABG was 7.302 pH, PCO2 of 81.0, PO2 of 74.8, bicarbonate level 39.4, ABG O2 saturation 95.4% on 40% FiO2 -Repeat chest x-ray this AM showed "CHF pattern.   Vascular congestion appears increased from yesterday." -Check Echocardiogram and start diuresis with IV Lasix 40 -continue with pulmonary nebs and BiPAP at night and during the day -Saturation dropped somewhat given the patient removes her O2 and worsen when she lays flat or moves  Twitching and myoclonic jerking, improved -Neurology consulted  -Ammonia level was 20 -Neurology feels this is secondary to hypercarbia but they are ruling out other etiologies next-head CT was ordered as well as an EEG; EEG had no evidence of any seizure activity -Permethrin cream has been stopped and will reduce her hydroxyzine -Continue to monitor and likely this is secondary to her hypercarbia and this is definitely improved  Hypertension  -Continue with clonidine, Imdur but will hold her lisinopril for now -Blood pressure is now 113/79  Chronic kidney disease stage IIIb improving now Hyperphosphatemia, improving  -Creatinine is appear to be baseline but however slightly worsened today given Lasix usage now is further worsened and it is ended up at 30/1.98; today it is improved is 21/1.28 -Patient's phosphorus level was 4.3 and today is 3.3 -Closely monitor closely since patient is on Lasix and lisinopril.;  Will hold her Lasix and  hold her lisinopril given her hyperkalemia -Avoid further nephrotoxic medications, contrast dyes, hypotension and renally dose medications -Fluids have stopped -Repeat CMP in a.m. 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 -Patient's hemoglobin/hematocrit was 10.6/37.5 today is 9.9/35.0 -Checked anemia panel and showed an iron level of 32, U IBC of 2 2, TIBC of 234, saturation of 14%, ferritin level 137, folate level 5.7, and vitamin B12 level 406 -Continue to monitor for signs and symptoms of bleeding; currently no overt bleeding noted -Repeat CBC in a.m.  Hyperkalemia -Mild as patient's  potassium is 5.1 -Continue to monitor and trend -Holding her lisinopril and received a dose of Lokelma yesterday.  We will give a dose of IV Lasix today -IVF increased initially but now has been stopped -Repeat CMP in a.m.  History of Gout  -C/w Allopurinol for now but may need to hold   History of Sleep Apnea  -Has not been compliant due to patient recently lost her CPAP.   -CPAP at bedtime ordered but she refused; -We will place on BiPAP she has been intermittently night and continues to pull it off -Has been more consistent with her BiPAP usage  -?PNA -Mild il-defined opacity on the left side of the lung field on chest x-ray.   -Repeat chest x-ray this AM showed "The mediastinal contour is stable. Heart size is enlarged. Increased pulmonary interstitium is identified bilaterally. No focal pneumonia or pleural effusion is identified. The bony structures are stable." -Continue with albuterol 2.5 mg IH every 4 hours as needed for wheezing or shortness of breath -Continue with BiPAP if she allows -Chest x-ray yesterday was read as CHF however we will need to monitor her given that it is very difficult to assess her clinical volume status given her significant obesity; we will stop her IV fluid hydration currently and monitor off of IV fluids -Repeat CXR this AM showed "Bilateral interstitial prominence. Lingular airspace disease which may reflect atelectasis versus pneumonia. No pleural effusion or pneumothorax. Stable cardiomediastinal silhouette. No aggressive osseous lesion." It was also read as "Cardiomegaly with pulmonary vascular congestion." -Patient is already on IV Ceftriaxone; May Consider Adding Azithormycin but I do not suspect that she clinically has a pneumonia -We will start diuresis given pulmonary vascular congestion  HLD -C/w omega-3 acid ethyl esters  GERD -Continue with Pantoprazole 40 p.o. daily  Super Morbid Obesity -Estimated body mass index is 63.77 kg/m as  calculated from the following:   Height as of this encounter: 5\' 6"  (1.676 m).   Weight as of this encounter: 179.2 kg. -Weight Loss and Dietary Counseling given   Chronic Pain -Continue with Tylenol 3 p.o. every 8 hours and Percocet 2 tabs p.o. every 6 as needed -avoid further narcotic medications given somnolence -We will give her IV fentanyl while she is n.p.o. wearing the BiPAP  Constipation -DG Abd showed "Air distention of nondescript bowel loop within the right hemiabdomen, nonspecific. Large stool burden within the right colon and distal colon/rectum." -Start the patient on adequate bowel regimen and try suppository  Bedbugs -Recently exposed in a motel that had bedbugs with mattress from infested -Recommend washing all belongings in extremely hot water if cannot wash belongings and place him in a trash bag and leave him for 1 week -Apply permethrin cream however will not reapply given that she ended up having some jerking and do not know if this is from the hypercarbia or a permethrin  cream- -Continue with contact precautions -Patient had some bugs in her hair too and will add antiitch shampoo -Continue to monitor carefully  DVT prophylaxis: Enoxaparin 40 mg sq q24h Code Status: FULL CODE Family Communication: No family present at bedside  Disposition Plan: Pending further clinical improvement of her cellulitis and nausea.  Patient is from home and requiring IV antibiotics for her cellulitis  Consultants:   Pulmonary  Neurology  Procedures:  LE VENOUS DUPLEX BILATERAL:  - No evidence of deep vein thrombosis seen in the lower extremities,  bilaterally.   Antimicrobials:  Anti-infectives (From admission, onward)   Start     Dose/Rate Route Frequency Ordered Stop   06/14/19 2000  cefTRIAXone (ROCEPHIN) 1 g in sodium chloride 0.9 % 100 mL IVPB     1 g 200 mL/hr over 30 Minutes Intravenous Every 24 hours 06/14/19 0756     06/14/19 0430  vancomycin (VANCOREADY) IVPB  2000 mg/400 mL     2,000 mg 200 mL/hr over 120 Minutes Intravenous  Once 06/14/19 0430 06/14/19 0700   06/14/19 0400  cefTRIAXone (ROCEPHIN) 1 g in sodium chloride 0.9 % 100 mL IVPB     1 g 200 mL/hr over 30 Minutes Intravenous  Once 06/14/19 0346 06/14/19 0450     Subjective: Seen and examined at bedside she is sitting at the edge of the bed and thinks she is a little bit better today but was complaining of some scalp itching.  Also complains of right leg pain.  No nausea or vomiting.  Denies any abdominal pain.  Her O2 saturation drops when she lays flat or moves.  No other concerns or complaints at this time.  Objective: Vitals:   06/20/19 0826 06/20/19 0922 06/20/19 1653 06/20/19 1749  BP:  125/60 113/79   Pulse:    81  Resp:    18  Temp:      TempSrc:      SpO2: 100%   (!) 82%  Weight:      Height:        Intake/Output Summary (Last 24 hours) at 06/20/2019 1810 Last data filed at 06/20/2019 0700 Gross per 24 hour  Intake 360 ml  Output 600 ml  Net -240 ml   Filed Weights   06/14/19 1400 06/16/19 2129  Weight: (!) 182.8 kg (!) 179.2 kg   Examination: Physical Exam:  Constitutional: WN/WD obese Caucasian female currently off the BiPAP and sitting at the edge of the bed eating breakfast and still complaining of some leg pain  Eyes: Lids and conjunctivae normal, sclerae anicteric  ENMT: External Ears, Nose appear normal. Grossly normal hearing.  Neck: Appears normal, supple, no cervical masses, normal ROM, no appreciable thyromegaly difficult to assess JVD given her body habitus Respiratory: Diminished to auscultation bilaterally with coarse breath sounds and some slight crackles, no wheezing, rales, rhonchi or crackles.  Wearing supplemental oxygen via nasal cannula and saturating well when she is sitting up and worsens when she lays down  Cardiovascular: RRR, no murmurs / rubs / gallops. S1 and S2 auscultated. 1+ LE Edema Abdomen: Soft, non-tender, Distended 2/2 to body  habitus. Bowel sounds positive.  GU: Deferred. Musculoskeletal: No clubbing / cyanosis of digits/nails. No joint deformity upper and lower extremities.  Skin: Has multiple bug bites and her right leg cellulitis is improved significantly and is not erythematous anymore.  Bug bites are on her face arms legs and she has some lesions from scratching. No induration; Warm and dry.  Neurologic: CN 2-12  grossly intact with no focal deficits. Romberg sign cerebellar reflexes not assessed.  Psychiatric: Normal judgment and insight. Alert and oriented x 3.  Slightly improved and not as anxious mood and appropriate affect.   Data Reviewed: I have personally reviewed following labs and imaging studies  CBC: Recent Labs  Lab 06/16/19 0326 06/17/19 0531 06/18/19 0353 06/19/19 0207 06/20/19 0253  WBC 12.4* 13.1* 9.9 9.2 9.7  NEUTROABS 10.0* 10.4* 7.3 6.9 6.8  HGB 11.3* 10.6* 9.8* 9.7* 9.9*  HCT 39.0 37.5 34.7* 34.2* 35.0*  MCV 100.3* 103.9* 103.0* 102.1* 101.7*  PLT 350 365 323 309 856   Basic Metabolic Panel: Recent Labs  Lab 06/16/19 0326 06/17/19 0531 06/18/19 0353 06/19/19 0207 06/20/19 0253  NA 141 142 142 141 143  K 4.9 5.6* 5.2* 5.3* 5.1  CL 98 95* 97* 95* 95*  CO2 34* 36* 37* 40* 38*  GLUCOSE 125* 111* 96 113* 118*  BUN 22* 27* 30* 27* 21*  CREATININE 1.40* 1.66* 1.98* 1.32* 1.28*  CALCIUM 8.7* 8.8* 8.4* 8.6* 8.6*  MG 1.7 1.8 1.7 2.1 1.9  PHOS 4.8* 4.3 4.2 3.0 3.3   GFR: Estimated Creatinine Clearance: 84.1 mL/min (A) (by C-G formula based on SCr of 1.28 mg/dL (H)). Liver Function Tests: Recent Labs  Lab 06/16/19 0326 06/17/19 0531 06/18/19 0353 06/19/19 0207 06/20/19 0253  AST 13* 10* 9* 11* 11*  ALT 15 14 12 12 11   ALKPHOS 72 64 54 53 51  BILITOT 0.5 0.3 0.5 0.5 0.7  PROT 7.5 7.2 6.3* 6.5 6.7  ALBUMIN 3.0* 3.0* 2.7* 2.7* 2.8*   No results for input(s): LIPASE, AMYLASE in the last 168 hours. Recent Labs  Lab 06/17/19 1441  AMMONIA 20   Coagulation  Profile: No results for input(s): INR, PROTIME in the last 168 hours. Cardiac Enzymes: No results for input(s): CKTOTAL, CKMB, CKMBINDEX, TROPONINI in the last 168 hours. BNP (last 3 results) No results for input(s): PROBNP in the last 8760 hours. HbA1C: No results for input(s): HGBA1C in the last 72 hours. CBG: Recent Labs  Lab 06/17/19 0643 06/17/19 1145  GLUCAP 100* 113*   Lipid Profile: No results for input(s): CHOL, HDL, LDLCALC, TRIG, CHOLHDL, LDLDIRECT in the last 72 hours. Thyroid Function Tests: No results for input(s): TSH, T4TOTAL, FREET4, T3FREE, THYROIDAB in the last 72 hours. Anemia Panel: Recent Labs    06/19/19 0207  VITAMINB12 406  FOLATE 5.7*  FERRITIN 137  TIBC 234*  IRON 32  RETICCTPCT 1.3   Sepsis Labs: Recent Labs  Lab 06/13/19 1902 06/14/19 0044  LATICACIDVEN 0.8 0.7    Recent Results (from the past 240 hour(s))  SARS CORONAVIRUS 2 (TAT 6-24 HRS) Nasopharyngeal Nasopharyngeal Swab     Status: None   Collection Time: 06/14/19  5:15 AM   Specimen: Nasopharyngeal Swab  Result Value Ref Range Status   SARS Coronavirus 2 NEGATIVE NEGATIVE Final    Comment: (NOTE) SARS-CoV-2 target nucleic acids are NOT DETECTED. The SARS-CoV-2 RNA is generally detectable in upper and lower respiratory specimens during the acute phase of infection. Negative results do not preclude SARS-CoV-2 infection, do not rule out co-infections with other pathogens, and should not be used as the sole basis for treatment or other patient management decisions. Negative results must be combined with clinical observations, patient history, and epidemiological information. The expected result is Negative. Fact Sheet for Patients: SugarRoll.be Fact Sheet for Healthcare Providers: https://www.woods-mathews.com/ This test is not yet approved or cleared by the [place] FDA and  has been authorized for detection and/or diagnosis of  SARS-CoV-2 by FDA under an Emergency Use Authorization (EUA). This EUA will remain  in effect (meaning this test can be used) for the duration of the COVID-19 declaration under Section 56 4(b)(1) of the Act, 21 U.S.C. section 360bbb-3(b)(1), unless the authorization is terminated or revoked sooner. Performed at [facility], [place] [address], [place], [place] [address]   MRSA PCR Screening     Status: None   Collection Time: 06/14/19  4:25 PM   Specimen: Nasal Mucosa; Nasopharyngeal  Result Value Ref Range Status   MRSA by PCR NEGATIVE NEGATIVE Final    Comment:        The GeneXpert MRSA Assay (FDA approved for NASAL specimens only), is one component of a comprehensive MRSA colonization surveillance program. It is not intended to diagnose MRSA infection nor to guide or monitor treatment for MRSA infections. Performed at [facility], [place] [address] [place], [place] [address]      RN Pressure Injury Documentation:     Estimated body mass index is 63.77 kg/m as calculated from the following:   Height as of this encounter: 5\' 6"  (1.676 m).   Weight as of this encounter: 179.2 kg.  Malnutrition Type:      Malnutrition Characteristics:      Nutrition Interventions:     Radiology Studies: DG CHEST PORT 1 VIEW  Result Date: 06/20/2019 CLINICAL DATA:  Shortness of breath EXAM: PORTABLE CHEST 1 VIEW COMPARISON:  Yesterday FINDINGS: Cardiomegaly and vascular pedicle widening accentuated by rotation. Low volume chest with diffuse vascular congestion. Interstitial prominence without Kerley lines. No definite effusion. IMPRESSION: CHF pattern.  Vascular congestion appears increased from yesterday. Electronically Signed   By: Monte Fantasia M.D.   On: 06/20/2019 07:14   DG CHEST PORT 1 VIEW  Result Date: 06/19/2019 CLINICAL DATA:  Shortness of breath EXAM: PORTABLE CHEST 1 VIEW COMPARISON:  06/18/2019 FINDINGS: Bilateral interstitial prominence. Lingular  airspace disease which may reflect atelectasis versus pneumonia. No pleural effusion or pneumothorax. Stable cardiomediastinal silhouette. No aggressive osseous lesion. IMPRESSION: Lingular airspace disease which may reflect atelectasis versus pneumonia. Cardiomegaly with pulmonary vascular congestion. Electronically Signed   By: Kathreen Devoid   On: 06/19/2019 07:58   ECHOCARDIOGRAM COMPLETE  Result Date: 06/20/2019    ECHOCARDIOGRAM REPORT   Patient Name:   CHANCIE LAMPERT Date of Exam: 06/20/2019 Medical Rec #:  [id number]         Height:       66.0 in Accession #:    [id number]        Weight:       395.1 lb Date of Birth:  1965/09/19          BSA:          2.671 m Patient Age:    62 years          BP:           114/71 mmHg Patient Gender: F                 HR:           76 bpm. Exam Location:  Inpatient Procedure: 2D Echo, Color Doppler and Cardiac Doppler Indications:    R06.9 DOE  History:        Patient has prior history of Echocardiogram examinations, most                 recent 07/09/2017. CHF; Risk  Factors:Sleep Apnea, Hypertension                 and Dyslipidemia.  Sonographer:    Raquel Sarna Senior RDCS Referring Phys: [id number] Georgina Quint LATIF [facility]  Sonographer Comments: Technically difficult study with very poor image quality due to morbid obesity and patient being unable to tolerate probe pressure. IMPRESSIONS  1. Technically difficult study with limited views. Grossly normal LV systolic function. Left ventricular ejection fraction, by estimation, is 60 to 65%. Left ventricular diastolic parameters are indeterminate.  2. Right ventricular systolic function was not well visualized. The right ventricular size is not well visualized.  3. The mitral valve was not well visualized. No evidence of mitral valve regurgitation.  4. The aortic valve was not well visualized. Aortic valve regurgitation is not visualized. No aortic stenosis is present.  5. The inferior vena cava is dilated in size with >50% respiratory  variability, suggesting right atrial pressure of 8 mmHg. FINDINGS  Left Ventricle: Left ventricular ejection fraction, by estimation, is 60 to 65%. The left ventricle has normal function. The left ventricle has no regional wall motion abnormalities. The left ventricular internal cavity size was normal in size. There is  Not well visualized left ventricular hypertrophy. Left ventricular diastolic parameters are indeterminate. Right Ventricle: The right ventricular size is not well visualized. Right vetricular wall thickness was not assessed. Right ventricular systolic function was not well visualized. Left Atrium: Left atrial size was not well visualized. Right Atrium: Right atrial size was not well visualized. Pericardium: Trivial pericardial effusion is present. Presence of pericardial fat pad. Mitral Valve: The mitral valve was not well visualized. No evidence of mitral valve regurgitation. Tricuspid Valve: The tricuspid valve is not well visualized. Tricuspid valve regurgitation is not demonstrated. Aortic Valve: The aortic valve was not well visualized. Aortic valve regurgitation is not visualized. No aortic stenosis is present. Pulmonic Valve: The pulmonic valve was not well visualized. Pulmonic valve regurgitation is not visualized. Aorta: The aortic root was not well visualized. Venous: The inferior vena cava is dilated in size with greater than 50% respiratory variability, suggesting right atrial pressure of 8 mmHg. IAS/Shunts: The interatrial septum was not well visualized.  AORTIC VALVE LVOT Vmax:   116.00 cm/s LVOT Vmean:  63.900 cm/s LVOT VTI:    0.209 m  SHUNTS Systemic VTI: 0.21 m Oswaldo Milian MD Electronically signed by Oswaldo Milian MD Signature Date/Time: 06/20/2019/2:30:30 PM    Final    Scheduled Meds:  allopurinol  100 mg Oral Daily   ALPRAZolam  0.5 mg Oral TID   arformoterol  15 mcg Nebulization BID   budesonide (PULMICORT) nebulizer solution  0.25 mg Nebulization BID    carbamide peroxide  5 drop Both EARS BID   cloNIDine  0.2 mg Oral BID   darifenacin  7.5 mg Oral Daily   DULoxetine  30 mg Oral Daily   heparin injection (subcutaneous)  5,000 Units Subcutaneous Q8H   isosorbide mononitrate  30 mg Oral Daily   ketoconazole   Topical Once   mouth rinse  15 mL Mouth Rinse BID   omega-3 acid ethyl esters  1 g Oral Daily   pantoprazole  40 mg Oral Daily   polyethylene glycol  17 g Oral BID   senna-docusate  1 tablet Oral BID   triamcinolone cream   Topical BID   vitamin E  100 Units Oral Daily   Continuous Infusions:  cefTRIAXone (ROCEPHIN)  IV Stopped (06/20/19 0700)    LOS: 6 days  Kerney Elbe, DO [facility] PAGER is on AMION  If 7PM-7AM, please contact night-coverage [email or web address]

## 2019-06-20 NOTE — Progress Notes (Signed)
Pt made aware of the importance of using her O2. When pt moves or lays flat O2 sats decreases into the 70's to mid 80's. Returns with rest and upright positioning.

## 2019-06-20 NOTE — Progress Notes (Signed)
Echocardiogram 2D Echocardiogram has been performed.  One[place] Deputy Rhyker Silversmith 06/20/2019, 10:36 AM   Patient said that she had bed bugs. Unsure if this is a known issue since the appropriate PPE was not on the door. (Only blue gowns provided, no shoe covers, hair bonnets, or bunny suits.)

## 2019-06-21 ENCOUNTER — Inpatient Hospital Stay (HOSPITAL_COMMUNITY): Payer: Medicaid Other

## 2019-06-21 LAB — COMPREHENSIVE METABOLIC PANEL
ALT: 13 U/L (ref 0–44)
AST: 13 U/L — ABNORMAL LOW (ref 15–41)
Albumin: 2.8 g/dL — ABNORMAL LOW (ref 3.5–5.0)
Alkaline Phosphatase: 55 U/L (ref 38–126)
Anion gap: 10 (ref 5–15)
BUN: 21 mg/dL — ABNORMAL HIGH (ref 6–20)
CO2: 39 mmol/L — ABNORMAL HIGH (ref 22–32)
Calcium: 8.7 mg/dL — ABNORMAL LOW (ref 8.9–10.3)
Chloride: 94 mmol/L — ABNORMAL LOW (ref 98–111)
Creatinine, Ser: 1.57 mg/dL — ABNORMAL HIGH (ref 0.44–1.00)
GFR calc Af Amer: 43 mL/min — ABNORMAL LOW (ref >60)
GFR calc non Af Amer: 37 mL/min — ABNORMAL LOW (ref >60)
Glucose, Bld: 115 mg/dL — ABNORMAL HIGH (ref 70–99)
Potassium: 5 mmol/L (ref 3.5–5.1)
Sodium: 143 mmol/L (ref 135–145)
Total Bilirubin: 0.5 mg/dL (ref 0.3–1.2)
Total Protein: 6.8 g/dL (ref 6.5–8.1)

## 2019-06-21 LAB — CBC WITH DIFFERENTIAL/PLATELET
Abs Immature Granulocytes: 0.1 10*3/uL — ABNORMAL HIGH (ref 0.00–0.07)
Basophils Absolute: 0 10*3/uL (ref 0.0–0.1)
Basophils Relative: 0 %
Eosinophils Absolute: 0.3 10*3/uL (ref 0.0–0.5)
Eosinophils Relative: 3 %
HCT: 36 % (ref 36.0–46.0)
Hemoglobin: 10.2 g/dL — ABNORMAL LOW (ref 12.0–15.0)
Immature Granulocytes: 1 %
Lymphocytes Relative: 20 %
Lymphs Abs: 1.9 10*3/uL (ref 0.7–4.0)
MCH: 28.9 pg (ref 26.0–34.0)
MCHC: 28.3 g/dL — ABNORMAL LOW (ref 30.0–36.0)
MCV: 102 fL — ABNORMAL HIGH (ref 80.0–100.0)
Monocytes Absolute: 0.4 10*3/uL (ref 0.1–1.0)
Monocytes Relative: 4 %
Neutro Abs: 6.9 10*3/uL (ref 1.7–7.7)
Neutrophils Relative %: 72 %
Platelets: 322 10*3/uL (ref 150–400)
RBC: 3.53 MIL/uL — ABNORMAL LOW (ref 3.87–5.11)
RDW: 14.3 % (ref 11.5–15.5)
WBC: 9.6 10*3/uL (ref 4.0–10.5)
nRBC: 0 % (ref 0.0–0.2)

## 2019-06-21 LAB — MAGNESIUM: Magnesium: 1.8 mg/dL (ref 1.7–2.4)

## 2019-06-21 LAB — BLOOD GAS, ARTERIAL
Acid-Base Excess: 15.6 mmol/L — ABNORMAL HIGH (ref 0.0–2.0)
Bicarbonate: 41.7 mmol/L — ABNORMAL HIGH (ref 20.0–28.0)
FIO2: 40
O2 Saturation: 98.1 %
Patient temperature: 36.8
pCO2 arterial: 74.4 mmHg (ref 32.0–48.0)
pH, Arterial: 7.366 (ref 7.350–7.450)
pO2, Arterial: 110 mmHg — ABNORMAL HIGH (ref 83.0–108.0)

## 2019-06-21 LAB — PHOSPHORUS: Phosphorus: 4.2 mg/dL (ref 2.5–4.6)

## 2019-06-21 MED ORDER — LORAZEPAM 0.5 MG PO TABS: 0.5000 mg | ORAL_TABLET | Freq: Four times a day (QID) | ORAL | Status: DC | PRN

## 2019-06-21 MED ORDER — CEPHALEXIN 500 MG PO CAPS
500.0000 mg | ORAL_CAPSULE | Freq: Four times a day (QID) | ORAL | Status: AC
  Administered 2019-06-21 – 2019-06-23 (×9): 500 mg via ORAL
  Filled 2019-06-21 (×9): qty 1

## 2019-06-21 NOTE — Progress Notes (Signed)
PROGRESS NOTE    Kara Vincent  LTR:[id number] DOB: 1965/04/27 DOA: 06/13/2019 PCP: Ladell Pier, MD  Brief Narrative: HPI per Dr. Gean Birchwood on 06/14/19 Kara Vincent is a 54 y.o. female with care morbid obesity asthma chronic kidney disease stage III hypertension gout presents to the ER because of increasing pain and swelling of the both lower extremities more on the right side.  This has been ongoing for last 1 week.  Patient states she also got more short of breath than usual.  Has some pleuritic type of chest pain denies any productive cough or fever or chills.  Patient in addition also has been having some frontal headache with no focal deficits.  Headache has been present for last few hours.  ED Course: In the ER on exam patient has bilateral lower extremity erythema and swelling of both legs more on the right side.  No signs of any compartment syndrome.  The swelling extends from the ankle up to the mid calf.  Patient also was mildly hypoxic usually uses chronic oxygen at home.  Has not been using her CPAP recently because she lost it.  Chest x-ray shows nonspecific findings for possible opacity.  CT angiogram was unremarkable.  Labs show creatinine 1.8 BNP 120 lactic acid 1.8 hemoglobin 10.6 WBC 11.4 x-ray of the both left-side and right side shows edema.  EKG shows normal sinus rhythm with low voltage.  **Interim History Cellulitis is improving somewhat and has been transitioned to po Abx.  Patient complaining some nausea now. At baseline wears 3 L of O2.  Have ordered her CPAP for the night but she is refused.  She apparently has bedbugs so we will give her permethrin 5% cream apply topically from the neck down.  Patient was little somnolent but easily arousable and states that she is feeling a little bit better yesterday but she is more somnolent again today and a little worse.  On 06/17/19 She started having some tremors and some twitching.  Neurology was consulted and  an ABG was ordered and she was found to have acute respiratory failure with hypercarbia and hypoxia and she was transferred to the stepdown unit for BiPAP given her PCO2 of 95.  Neurology worked the patient up for twitching but thinks it is secondary to her hypercarbia  06/18/19: Overnight the patient was intermittently wearing BiPAP and this morning she was more hypercarbic and somnolent and agitated.  Because of her worsened ABG critical care was consulted and Dr. Tamala Julian recommended to continue BiPAP nightly and as needed during the day and recommends monitoring her closely.  Her CO2 did improve some after she did wear and will need to continue monitor her carefully.  She has been intermittently agitated and intermittently noncompliant with being off BiPAP throughout the day and has a high risk for decompensation.  06/19/19: Complaining of some right leg pain but does appear better with consistent BiPAP usage.  Will need social work help to see if she can get reliable nephrology for an NIV; pulmonary thinks that she will have continued hospitalizations due to lack of NIV.  She thinks she is a little bit better today and pulmonary recommending continue nebs and low-dose Xanax as needed  06/20/19: Complaining of some right leg pain.  Chest x-ray reveals more of pulmonary vascular congestion volume overload so we will start diuresis and check an echocardiogram given that she does have history of congestive heart failure which is diastolic in nature.  06/21/19: When I  examined her she was sleepy and a little somnolent and and she continues to refuse her BiPAP today.  Nursing also states that she has been intermittently taking off her oxygen.  ABG was much improved and PCO2 was down to 74.4.  Renal function was slightly elevated given the Lasix administration yesterday.   Assessment & Plan:   Principal Problem:   Cellulitis of both lower extremities Active Problems:   Asthma exacerbation   CKD (chronic  kidney disease) stage 3, GFR 30-59 ml/min (HCC)   OSA (obstructive sleep apnea)   Essential hypertension   Gout   Morbid obesity ([place])   Acute respiratory failure with hypoxia (HCC)   Cellulitis  Cellulitis of the Right Lower Extremity, improving somewhat -for which patient is on empiric antibiotics with IV Ceftriaxone but will stop and changed to oral antibiotics and complete a 10-day course -Will check Dopplers to rule out DVT and showed "No evidence of deep vein thrombosis seen in the lower extremities, bilaterally." -No signs of any compartment syndrome. -WBC has been hovering around 11 and 12 but yesterday was 13.1 and today is improved at 9.9 -Lactic acid level was 0.7 -IV fluid hydration is now stopped and creatinine is better -Elevate Extremity -Continue monitor temperature curve and continue monitor WBC -this is improving  Acute on chronic respiratory failure hypoxia and now hypercarbia could be from asthma versus possible fluid likely in the setting of acute on chronic diastolic CHF,  OSA and obesity hypoventilation syndrome.   -BNP was mildly elevated at 120.9 on admission -CT angio was without PE -She wears 3 L of supplemental oxygen at home; now requiring BiPAP nightly as well as as needed -Repeat ABG was better as below -We will need to blow off her CO2 and will attempt BiPAP.  BiPAP was initiated with patient has been intermittently compliant with it and a few days ago her PCO2 was greater than 100 -Patient was taking p.o. Lasix at home and she is diuresed and stopped and given IV fluids however this was stopped given her chest x-ray yesterday.  Chest x-ray today looks a little worse so we will start back diuresis and check echocardiogram -This is likely multifactorial probably due to OSA and obesity hypoventilation syndrome -Closely monitor continued pulmonary toilet and continued monitoring of her respiratory status -Continue CPAP at night and ensure compliance -We will  need an ambulatory home O2 screen prior to discharge -BiPAP nightly and as needed and encourage compliance -pulmonary recommends figure out a long-term if she is arousable enough housing for an NIV -Most recent ABG was 7.326 pH, PCO2 74.4, PO2 of 110, bicarbonate level 41.7, and an ABG O2 saturation of 98.1% on 40% FiO2 -Repeat chest x-ray yesterday AM showed "CHF pattern.  Vascular congestion appears increased from yesterday." -Today CXR showed "Mild bibasilar atelectasis. No edema or consolidation. There is cardiomegaly with a degree of pulmonary vascular congestion." -Check Echocardiogram and start diuresis with IV Lasix 40 -continue with pulmonary nebs and BiPAP at night and during the day -Saturation dropped somewhat given the patient removes her O2 and worsen when she lays flat or moves  Twitching and myoclonic jerking, improved -Neurology consulted  -Ammonia level was 20 -Neurology feels this is secondary to hypercarbia but they are ruling out other etiologies next-head CT was ordered as well as an EEG; EEG had no evidence of any seizure activity -Permethrin cream has been stopped and will reduce her hydroxyzine -Continue to monitor and likely this is secondary to her hypercarbia  and this is definitely improved  Hypertension  -Continue with clonidine, Imdur but will hold her lisinopril for now -Blood pressure is now 113/79  Chronic kidney disease stage IIIb improving now Hyperphosphatemia, improving  -Creatinine is appear to be baseline but however slightly worsened today given Lasix usage now is further worsened and it is ended up at 30/1.98; today it is improved is 21/1.28 -Patient's phosphorus level was 4.3 and today is 3.3 -Closely monitor closely since patient is on Lasix and lisinopril.;  Will hold her Lasix and hold her lisinopril given her hyperkalemia -Avoid further nephrotoxic medications, contrast dyes, hypotension and renally dose medications -Fluids have  stopped -Repeat CMP in a.m. 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 -Patient's hemoglobin/hematocrit was 10.6/37.5 today is 9.9/35.0 -Checked anemia panel and showed an iron level of 32, U IBC of 2 2, TIBC of 234, saturation of 14%, ferritin level 137, folate level 5.7, and vitamin B12 level 406 -Continue to monitor for signs and symptoms of bleeding; currently no overt bleeding noted -Repeat CBC in a.m.  Hyperkalemia improving -Mild as patient's potassium is 5.0 -Continue to monitor and trend -Holding her lisinopril and received a dose of Lokelma yesterday. Given a Dose of IV Lasix yesterday  -Continue monitor renal function now -Repeat CMP in a.m.  History of Gout  -C/w Allopurinol for now but may need to hold   History of Sleep Apnea  -Has not been compliant due to patient recently lost her CPAP.   -CPAP at bedtime ordered but she refused; -We will place on BiPAP she has been intermittently night and continues to pull it off -Has been more consistent with her BiPAP usage  -?PNA, essentially ruled out -Mild il-defined opacity on the left side of the lung field on chest x-ray.   -Repeat chest x-ray this AM showed "The mediastinal contour is stable. Heart size is enlarged. Increased pulmonary interstitium is identified bilaterally. No focal pneumonia or pleural effusion is identified. The bony structures are stable." -Continue with albuterol 2.5 mg IH every 4 hours as needed for wheezing or shortness of breath -Continue with BiPAP if she allows -Chest x-ray yesterday was read as CHF however we will need to monitor her given that it is very difficult to assess her clinical volume status given her significant obesity; we will stop her IV fluid hydration currently and monitor off of IV fluids -Repeat CXR this AM showed "Bilateral interstitial prominence. Lingular airspace disease which may reflect  atelectasis versus pneumonia. No pleural effusion or pneumothorax. Stable cardiomediastinal silhouette. No aggressive osseous lesion." It was also read as "Cardiomegaly with pulmonary vascular congestion." -Patient is already on IV Ceftriaxone; May Consider Adding Azithormycin but I do not suspect that she clinically has a pneumonia -Patient was given IV diuresis yesterday given her pulmonary vascular congestion  HLD -C/w omega-3 acid ethyl esters  GERD -Continue with Pantoprazole 40 p.o. daily  Super Morbid Obesity -Estimated body mass index is 63.77 kg/m as calculated from the following:   Height as of this encounter: 5\' 6"  (1.676 m).   Weight as of this encounter: 179.2 kg. -Weight Loss and Dietary Counseling given   Chronic Pain -Continue with Tylenol 3 p.o. every 8 hours and Percocet 2 tabs p.o. every 6 as needed -avoid further narcotic medications given somnolence -We will give her IV fentanyl while she is n.p.o. wearing the BiPAP  Constipation -DG Abd showed "Air distention of nondescript bowel  loop within the right hemiabdomen, nonspecific. Large stool burden within the right colon and distal colon/rectum." -Start the patient on adequate bowel regimen and try suppository  Bedbugs -Recently exposed in a motel that had bedbugs with mattress from infested -Recommend washing all belongings in extremely hot water if cannot wash belongings and place him in a trash bag and leave him for 1 week -Apply permethrin cream however will not reapply given that she ended up having some jerking and do not know if this is from the hypercarbia or a permethrin cream- -Continue with contact precautions -Patient had some bugs in her hair too and will add antiitch shampoo -Continue to monitor carefully  DVT prophylaxis: Enoxaparin 40 mg sq q24h Code Status: FULL CODE Family Communication: No family present at bedside  Disposition Plan: Pending further clinical improvement of her cellulitis  and and Respiratory Failure.  Patient is from home and requiring IV antibiotics for her cellulitis with changes to the p.o.  Now she is intermittently hypercarbic due to noncompliance and will need possibly an NIV at discharge.  Will need social health services given that the patient is homeless and does require oxygen and intermittent BiPAP.  Difficult disposition at this time given her continued hypercarbia and noncompliance as well as homelessness status.  Consultants:   Pulmonary  Neurology  Procedures:  LE VENOUS DUPLEX BILATERAL:  - No evidence of deep vein thrombosis seen in the lower extremities,  bilaterally.   Antimicrobials:  Anti-infectives (From admission, onward)   Start     Dose/Rate Route Frequency Ordered Stop   06/21/19 1800  cephALEXin (KEFLEX) capsule 500 mg     500 mg Oral Every 6 hours 06/21/19 1236 06/23/19 2359   06/14/19 2000  cefTRIAXone (ROCEPHIN) 1 g in sodium chloride 0.9 % 100 mL IVPB  Status:  Discontinued     1 g 200 mL/hr over 30 Minutes Intravenous Every 24 hours 06/14/19 0756 06/21/19 1236   06/14/19 0430  vancomycin (VANCOREADY) IVPB 2000 mg/400 mL     2,000 mg 200 mL/hr over 120 Minutes Intravenous  Once 06/14/19 0430 06/14/19 0700   06/14/19 0400  cefTRIAXone (ROCEPHIN) 1 g in sodium chloride 0.9 % 100 mL IVPB     1 g 200 mL/hr over 30 Minutes Intravenous  Once 06/14/19 0346 06/14/19 0450     Subjective: Seen and examined at bedside and she was somnolent this morning.  No nausea or vomiting.  She denied any pain this time when I saw her but was sleeping.  No other concerns or complaints at this time.  Intermittently compliant with the BiPAP and nursing states that she has been pulling off her oxygen as well.  Objective: Vitals:   06/20/19 2309 06/21/19 0301 06/21/19 0804 06/21/19 0839  BP:  (!) 109/55  118/73  Pulse:  82    Resp:  17    Temp: 98.2 F (36.8 C) 97.7 F (36.5 C)    TempSrc: Oral Oral    SpO2:  96% 96%   Weight:        Height:        Intake/Output Summary (Last 24 hours) at 06/21/2019 1758 Last data filed at 06/20/2019 2101 Gross per 24 hour  Intake --  Output 300 ml  Net -300 ml   Filed Weights   06/14/19 1400 06/16/19 2129  Weight: (!) 182.8 kg (!) 179.2 kg   Examination: Physical Exam:  Constitutional: WN/WD obese Caucasian female currently wearing supplemental oxygen via nasal cannula and is somnolent  and a little drowsy but is arousable Eyes: Lids and conjunctivae normal, sclerae anicteric  ENMT: External Ears, Nose appear normal. Grossly normal hearing.  Neck: Appears normal, supple, no cervical masses, normal ROM, no appreciable thyromegaly; difficult to assess JVD due to her body habitus Respiratory: Diminished to auscultation bilaterally with coarse breath sounds, no wheezing, rales, rhonchi or crackles. Normal respiratory effort and patient is not tachypenic. No accessory muscle use. Unlabored breathing Cardiovascular: RRR, no murmurs / rubs / gallops. S1 and S2 auscultated.  Trace to 1+ lower extremity edema Abdomen: Soft, non-tender, distended secondary body habitus. Bowel sounds positive.  GU: Deferred. Musculoskeletal: No clubbing / cyanosis of digits/nails. No joint deformity upper and lower extremities.  Skin: No rashes, lesions, ulcers on a limited skin evaluation. No induration; Warm and dry.  Neurologic: CN 2-12 grossly intact with no focal deficits.  Romberg sign and cerebellar reflexes not assessed.  Psychiatric: Normal judgment and insight.  Patient is somnolent and a little drowsy.. Normal mood and appropriate affect.   Data Reviewed: I have personally reviewed following labs and imaging studies  CBC: Recent Labs  Lab 06/17/19 0531 06/18/19 0353 06/19/19 0207 06/20/19 0253 06/21/19 0252  WBC 13.1* 9.9 9.2 9.7 9.6  NEUTROABS 10.4* 7.3 6.9 6.8 6.9  HGB 10.6* 9.8* 9.7* 9.9* 10.2*  HCT 37.5 34.7* 34.2* 35.0* 36.0  MCV 103.9* 103.0* 102.1* 101.7* 102.0*  PLT 365 323  309 324 789   Basic Metabolic Panel: Recent Labs  Lab 06/17/19 0531 06/18/19 0353 06/19/19 0207 06/20/19 0253 06/21/19 0252  NA 142 142 141 143 143  K 5.6* 5.2* 5.3* 5.1 5.0  CL 95* 97* 95* 95* 94*  CO2 36* 37* 40* 38* 39*  GLUCOSE 111* 96 113* 118* 115*  BUN 27* 30* 27* 21* 21*  CREATININE 1.66* 1.98* 1.32* 1.28* 1.57*  CALCIUM 8.8* 8.4* 8.6* 8.6* 8.7*  MG 1.8 1.7 2.1 1.9 1.8  PHOS 4.3 4.2 3.0 3.3 4.2   GFR: Estimated Creatinine Clearance: 68.6 mL/min (A) (by C-G formula based on SCr of 1.57 mg/dL (H)). Liver Function Tests: Recent Labs  Lab 06/17/19 0531 06/18/19 0353 06/19/19 0207 06/20/19 0253 06/21/19 0252  AST 10* 9* 11* 11* 13*  ALT 14 12 12 11 13   ALKPHOS 64 54 53 51 55  BILITOT 0.3 0.5 0.5 0.7 0.5  PROT 7.2 6.3* 6.5 6.7 6.8  ALBUMIN 3.0* 2.7* 2.7* 2.8* 2.8*   No results for input(s): LIPASE, AMYLASE in the last 168 hours. Recent Labs  Lab 06/17/19 1441  AMMONIA 20   Coagulation Profile: No results for input(s): INR, PROTIME in the last 168 hours. Cardiac Enzymes: No results for input(s): CKTOTAL, CKMB, CKMBINDEX, TROPONINI in the last 168 hours. BNP (last 3 results) No results for input(s): PROBNP in the last 8760 hours. HbA1C: No results for input(s): HGBA1C in the last 72 hours. CBG: Recent Labs  Lab 06/17/19 0643 06/17/19 1145  GLUCAP 100* 113*   Lipid Profile: No results for input(s): CHOL, HDL, LDLCALC, TRIG, CHOLHDL, LDLDIRECT in the last 72 hours. Thyroid Function Tests: No results for input(s): TSH, T4TOTAL, FREET4, T3FREE, THYROIDAB in the last 72 hours. Anemia Panel: Recent Labs    06/19/19 0207  VITAMINB12 406  FOLATE 5.7*  FERRITIN 137  TIBC 234*  IRON 32  RETICCTPCT 1.3   Sepsis Labs: No results for input(s): PROCALCITON, LATICACIDVEN in the last 168 hours.  Recent Results (from the past 240 hour(s))  SARS CORONAVIRUS 2 (TAT 6-24 HRS) Nasopharyngeal Nasopharyngeal Swab  Status: None   Collection Time: 06/14/19   5:15 AM   Specimen: Nasopharyngeal Swab  Result Value Ref Range Status   SARS Coronavirus 2 NEGATIVE NEGATIVE Final    Comment: (NOTE) SARS-CoV-2 target nucleic acids are NOT DETECTED. The SARS-CoV-2 RNA is generally detectable in upper and lower respiratory specimens during the acute phase of infection. Negative results do not preclude SARS-CoV-2 infection, do not rule out co-infections with other pathogens, and should not be used as the sole basis for treatment or other patient management decisions. Negative results must be combined with clinical observations, patient history, and epidemiological information. The expected result is Negative. Fact Sheet for Patients: SugarRoll.be Fact Sheet for Healthcare Providers: https://www.woods-mathews.com/ This test is not yet approved or cleared by the [place] FDA and  has been authorized for detection and/or diagnosis of SARS-CoV-2 by FDA under an Emergency Use Authorization (EUA). This EUA will remain  in effect (meaning this test can be used) for the duration of the COVID-19 declaration under Section 56 4(b)(1) of the Act, 21 U.S.C. section 360bbb-3(b)(1), unless the authorization is terminated or revoked sooner. Performed at [facility], [place] [address], [place], [place] [address]   MRSA PCR Screening     Status: None   Collection Time: 06/14/19  4:25 PM   Specimen: Nasal Mucosa; Nasopharyngeal  Result Value Ref Range Status   MRSA by PCR NEGATIVE NEGATIVE Final    Comment:        The GeneXpert MRSA Assay (FDA approved for NASAL specimens only), is one component of a comprehensive MRSA colonization surveillance program. It is not intended to diagnose MRSA infection nor to guide or monitor treatment for MRSA infections. Performed at [facility], [place] [address] [place], [place] [address]      RN Pressure Injury Documentation:     Estimated body mass index is 63.77  kg/m as calculated from the following:   Height as of this encounter: 5\' 6"  (1.676 m).   Weight as of this encounter: 179.2 kg.  Malnutrition Type:      Malnutrition Characteristics:      Nutrition Interventions:     Radiology Studies: DG CHEST PORT 1 VIEW  Result Date: 06/21/2019 CLINICAL DATA:  Shortness of breath EXAM: PORTABLE CHEST 1 VIEW COMPARISON:  June 20, 2019 FINDINGS: Mild bibasilar atelectasis. No edema or consolidation. There is cardiomegaly with a degree of pulmonary vascular congestion. IMPRESSION: No active disease. Electronically Signed   By: Lowella Grip III M.D.   On: 06/21/2019 08:57   DG CHEST PORT 1 VIEW  Result Date: 06/20/2019 CLINICAL DATA:  Shortness of breath EXAM: PORTABLE CHEST 1 VIEW COMPARISON:  Yesterday FINDINGS: Cardiomegaly and vascular pedicle widening accentuated by rotation. Low volume chest with diffuse vascular congestion. Interstitial prominence without Kerley lines. No definite effusion. IMPRESSION: CHF pattern.  Vascular congestion appears increased from yesterday. Electronically Signed   By: Monte Fantasia M.D.   On: 06/20/2019 07:14   ECHOCARDIOGRAM COMPLETE  Result Date: 06/20/2019    ECHOCARDIOGRAM REPORT   Patient Name:   Kara Vincent Date of Exam: 06/20/2019 Medical Rec #:  [id number]         Height:       66.0 in Accession #:    [id number]        Weight:       395.1 lb Date of Birth:  [birth year]          BSA:  2.671 m Patient Age:    76 years          BP:           114/71 mmHg Patient Gender: F                 HR:           76 bpm. Exam Location:  Inpatient Procedure: 2D Echo, Color Doppler and Cardiac Doppler Indications:    R06.9 DOE  History:        Patient has prior history of Echocardiogram examinations, most                 recent 07/09/2017. CHF; Risk Factors:Sleep Apnea, Hypertension                 and Dyslipidemia.  Sonographer:    Raquel Sarna Senior RDCS Referring Phys: [id number] Georgina Quint LATIF [facility]  Sonographer  Comments: Technically difficult study with very poor image quality due to morbid obesity and patient being unable to tolerate probe pressure. IMPRESSIONS  1. Technically difficult study with limited views. Grossly normal LV systolic function. Left ventricular ejection fraction, by estimation, is 60 to 65%. Left ventricular diastolic parameters are indeterminate.  2. Right ventricular systolic function was not well visualized. The right ventricular size is not well visualized.  3. The mitral valve was not well visualized. No evidence of mitral valve regurgitation.  4. The aortic valve was not well visualized. Aortic valve regurgitation is not visualized. No aortic stenosis is present.  5. The inferior vena cava is dilated in size with >50% respiratory variability, suggesting right atrial pressure of 8 mmHg. FINDINGS  Left Ventricle: Left ventricular ejection fraction, by estimation, is 60 to 65%. The left ventricle has normal function. The left ventricle has no regional wall motion abnormalities. The left ventricular internal cavity size was normal in size. There is  Not well visualized left ventricular hypertrophy. Left ventricular diastolic parameters are indeterminate. Right Ventricle: The right ventricular size is not well visualized. Right vetricular wall thickness was not assessed. Right ventricular systolic function was not well visualized. Left Atrium: Left atrial size was not well visualized. Right Atrium: Right atrial size was not well visualized. Pericardium: Trivial pericardial effusion is present. Presence of pericardial fat pad. Mitral Valve: The mitral valve was not well visualized. No evidence of mitral valve regurgitation. Tricuspid Valve: The tricuspid valve is not well visualized. Tricuspid valve regurgitation is not demonstrated. Aortic Valve: The aortic valve was not well visualized. Aortic valve regurgitation is not visualized. No aortic stenosis is present. Pulmonic Valve: The pulmonic valve  was not well visualized. Pulmonic valve regurgitation is not visualized. Aorta: The aortic root was not well visualized. Venous: The inferior vena cava is dilated in size with greater than 50% respiratory variability, suggesting right atrial pressure of 8 mmHg. IAS/Shunts: The interatrial septum was not well visualized.  AORTIC VALVE LVOT Vmax:   116.00 cm/s LVOT Vmean:  63.900 cm/s LVOT VTI:    0.209 m  SHUNTS Systemic VTI: 0.21 m Oswaldo Milian MD Electronically signed by Oswaldo Milian MD Signature Date/Time: 06/20/2019/2:30:30 PM    Final    Scheduled Meds: . allopurinol  100 mg Oral Daily  . ALPRAZolam  0.5 mg Oral TID  . arformoterol  15 mcg Nebulization BID  . budesonide (PULMICORT) nebulizer solution  0.25 mg Nebulization BID  . carbamide peroxide  5 drop Both EARS BID  . cephALEXin  500 mg Oral Q6H  . cloNIDine  0.2 mg  Oral BID  . darifenacin  7.5 mg Oral Daily  . DULoxetine  30 mg Oral Daily  . heparin injection (subcutaneous)  5,000 Units Subcutaneous Q8H  . isosorbide mononitrate  30 mg Oral Daily  . mouth rinse  15 mL Mouth Rinse BID  . omega-3 acid ethyl esters  1 g Oral Daily  . pantoprazole  40 mg Oral Daily  . polyethylene glycol  17 g Oral BID  . senna-docusate  1 tablet Oral BID  . triamcinolone cream   Topical BID  . vitamin E  100 Units Oral Daily   Continuous Infusions:   LOS: 7 days   Kerney Elbe, DO [facility] PAGER is on AMION  If 7PM-7AM, please contact night-coverage [email or web address]

## 2019-06-21 NOTE — Progress Notes (Signed)
ABG drawn and RT tried to place pt back on BIPAP, but pt refused. RN aware.

## 2019-06-21 NOTE — Progress Notes (Signed)
Patient indicated that she is not ready for PPV at this time.

## 2019-06-21 NOTE — Progress Notes (Signed)
Pt pulled out IV and stated that she doesn't want one in. She stated that she wants her abx by mouth. Will let on call provider know.

## 2019-06-21 NOTE — Progress Notes (Signed)
Pt complaining about IV hurting but she really doesn't want an IV. Will reach out to IV team to come assess.

## 2019-06-21 NOTE — Progress Notes (Signed)
   NAME:  Kara Vincent, MRN:  [id number], DOB:  [birth year], LOS: 7 ADMISSION DATE:  06/13/2019, CONSULTATION DATE:  06/18/2019 REFERRING MD:  Alfredia Ferguson CHIEF COMPLAINT:  SOB   Brief History   54 year old woman with hx of morbid obesity, chronic pain/anxiety presenting with acute on chronic hypercarbia.  History of present illness   54 year old woman nonsmoker with hx of morbid obesity, chronic pain/anxiety presenting with acute on chronic hypercarbia.  Has a presumed diagnosis of lower ext cellulitis. She does not like wearing BIPAP but eventually was coerced into wearing it a few hours this AM.  ABG shows worsening retention.  PCCM consulted for further recs.  Patient is anxious and requesting meds by name for this.  Past Medical History  OSA w/o CPAP, chronic O2 need, OHS, homeless, "asthma", hidranitis, anxiety, chronic pain  Significant Hospital Events   3/23 admitted  Consults:  PCCM Procedures:  None  Significant Diagnostic Tests:  CTA chest- obese LE doppler neg   Micro Data:  COVID neg HIV neg MRSA swab neg  Antimicrobials:  Ceftriaxone 3/22>>  Interim history/subjective:  Pruritus  Objective   Blood pressure 118/73, pulse 82, temperature 97.7 F (36.5 C), temperature source Oral, resp. rate 17, height 5\' 6"  (1.676 m), weight (!) 179.2 kg, last menstrual period 03/24/2009, SpO2 96 %.        Intake/Output Summary (Last 24 hours) at 06/21/2019 1611 Last data filed at 06/20/2019 2101 Gross per 24 hour  Intake --  Output 300 ml  Net -300 ml   Filed Weights   06/14/19 1400 06/16/19 2129  Weight: (!) 182.8 kg (!) 179.2 kg    Examination: General: Morbidly obese HENT: Mallampati 4 Lungs: Clear breath sounds bilaterally Cardiovascular: S1-S2 appreciated Abdomen: Soft, bowel sounds appreciated  Resolved Hospital Problem list     Assessment & Plan:  Acute on chronic hypercarbic respiratory failure Obesity hypoventilation syndrome Obstructive sleep  apnea  Trying to tolerate BiPAP Continue medications for anxiety  Cellulitis management per primary  Extensive skin rash, itching  May benefit from BiPAP use long-term-Involve social services as patient is currently homeless.  Compliance may be an issue  Sherrilyn Rist, MD [place] PCCM Pager: [phone]

## 2019-06-21 NOTE — Plan of Care (Signed)
  Problem: Health Behavior/Discharge Planning: Goal: Ability to manage health-related needs will improve Outcome: Progressing   Problem: Clinical Measurements: Goal: Will remain free from infection Outcome: Progressing Goal: Diagnostic test results will improve Outcome: Progressing Goal: Respiratory complications will improve Outcome: Progressing Goal: Cardiovascular complication will be avoided Outcome: Progressing   Problem: Activity: Goal: Risk for activity intolerance will decrease Outcome: Progressing   Problem: Nutrition: Goal: Adequate nutrition will be maintained Outcome: Progressing   Problem: Coping: Goal: Level of anxiety will decrease Outcome: Progressing   Problem: Elimination: Goal: Will not experience complications related to bowel motility Outcome: Progressing Goal: Will not experience complications related to urinary retention Outcome: Progressing   Problem: Pain Managment: Goal: General experience of comfort will improve Outcome: Progressing Note: Pt asking for oxy.    Problem: Safety: Goal: Ability to remain free from injury will improve Outcome: Progressing   Problem: Skin Integrity: Goal: Risk for impaired skin integrity will decrease Outcome: Progressing   Problem: Clinical Measurements: Goal: Ability to avoid or minimize complications of infection will improve Outcome: Progressing   Problem: Skin Integrity: Goal: Skin integrity will improve Outcome: Progressing

## 2019-06-21 NOTE — Progress Notes (Signed)
ABG sent to lab (notified prior to sending)

## 2019-06-21 NOTE — Progress Notes (Signed)
Patient's respiratory status monitored overnight.  [place] remains in use.  Saturation 93% and RR 20.  No distress or signs of apnea noted.

## 2019-06-21 NOTE — TOC Initial Note (Signed)
Transition of Care [facility]) - Initial/Assessment Note    Patient Details  Name: Kara Vincent MRN: [id number] Date of Birth: 1966-01-02  [facility]) CM/SW Contact:    Angelita Ingles, RN Phone Number: 06/21/2019, 4:21 PM  Clinical Narrative:  Patient reports that she uses O2 at home 3L at all times. Patient reports that she currently lives in a hotel that is infested with bedbugs. Patient states that she will return to the hotel where her friend can provide 24 hour care with meals,bathing, medications and all care. Patient inquired if her friend could get paid to provide care for her because he does everything for her.Patient informed that CM will follow up. Patient is requesting [facility] and shower chair. CM will notify MD for orders. Will continue to follow.                   Expected Discharge Plan: Home/Self Care(friend to provide 24 hour care) Barriers to Discharge: Continued Medical Work up   Patient Goals and CMS Choice        Expected Discharge Plan and Services Expected Discharge Plan: Home/Self Care(friend to provide 24 hour care)   Discharge Planning Services: CM Consult   Living arrangements for the past 2 months: Homeless(living in hotel with friend)                   DME Agency: [employer](previously on O2 prior to admit)                  Prior Living Arrangements/Services Living arrangements for the past 2 months: Homeless(living in hotel with friend) Lives with:: Friends Patient language and need for interpreter reviewed:: Yes        Need for Family Participation in Patient Care: Yes (Comment) Care giver support system in place?: Yes (comment)(Friend to provide care) Current home services: DME(O2) Criminal Activity/Legal Involvement Pertinent to Current Situation/Hospitalization: No - Comment as needed  Activities of Daily Living Home Assistive Devices/Equipment: Cane (specify quad or straight), Oxygen, Other (Comment)(motorized wheelchair at  bedside) ADL Screening (condition at time of admission) Patient's cognitive ability adequate to safely complete daily activities?: Yes Is the patient deaf or have difficulty hearing?: No Does the patient have difficulty seeing, even when wearing glasses/contacts?: No Does the patient have difficulty concentrating, remembering, or making decisions?: No Patient able to express need for assistance with ADLs?: Yes Does the patient have difficulty dressing or bathing?: Yes Independently performs ADLs?: No Communication: Independent Dressing (OT): Needs assistance Is this a change from baseline?: Change from baseline, expected to last >3 days Grooming: Needs assistance Is this a change from baseline?: Change from baseline, expected to last >3 days Feeding: Independent Bathing: Needs assistance Is this a change from baseline?: Change from baseline, expected to last >3 days Toileting: Needs assistance Is this a change from baseline?: Change from baseline, expected to last >3days In/Out Bed: Needs assistance Is this a change from baseline?: Change from baseline, expected to last >3 days Walks in Home: Needs assistance Is this a change from baseline?: Change from baseline, expected to last >3 days Does the patient have difficulty walking or climbing stairs?: Yes Weakness of Legs: Both Weakness of Arms/Hands: Both  Permission Sought/Granted                  Emotional Assessment Appearance:: Appears stated age Attitude/Demeanor/Rapport: Gracious Affect (typically observed): Calm Orientation: : Oriented to Self, Oriented to Place, Oriented to  Time, Oriented to Situation Alcohol /  Substance Use: Not Applicable Psych Involvement: No (comment)  Admission diagnosis:  Candidiasis of skin [B37.2] Cellulitis [L03.90] Panniculitis [M79.3] Pain [R52] Cellulitis of left leg [L03.116] Cellulitis of right lower extremity [L03.115] Acute respiratory failure with hypoxia (HCC)  [J96.01] Exacerbation of asthma, unspecified asthma severity, unspecified whether persistent [J45.901] Patient Active Problem List   Diagnosis Date Noted  . Acute respiratory failure with hypoxia ([place]) 06/14/2019  . Cellulitis of both lower extremities 06/14/2019  . Cellulitis 06/14/2019  . Morbid obesity ([place]) 12/01/2017  . Primary osteoarthritis of both knees 12/01/2017  . Anxiety 12/01/2017  . Impaired mobility 12/01/2017  . Essential hypertension 12/14/2015  . HLD (hyperlipidemia) 12/14/2015  . GERD (gastroesophageal reflux disease) 12/14/2015  . Gout 12/14/2015  . Darier's disease   . Leukocytosis   . 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 Right-sided heart failure ([place]) 11/20/2014  . OSA (obstructive sleep apnea)   . CKD (chronic kidney disease) stage 3, GFR 30-59 ml/min (HCC) 11/17/2014  . Asthma exacerbation 11/16/2014  . Venous stasis 11/16/2014  . Acute on chronic respiratory failure with hypoxia and hypercapnia ([place]) 11/16/2014  . Chronic pain 08/24/2011  . Obesity hypoventilation syndrome ([place]) 08/24/2011   PCP:  Ladell Pier, MD Pharmacy:   [facility], [address] [place] [address] Phone: [phone] Fax: [phone]  [facility], [address] [place] [address] Phone: [phone] Fax: [phone]     Social Determinants of Health (SDOH) Interventions    Readmission Risk Interventions No flowsheet data found.

## 2019-06-21 NOTE — Progress Notes (Signed)
ABG results 74.4 CO2.. Patient refusing bi PAP at this time. MD notified/ Paged.[name]

## 2019-06-22 DIAGNOSIS — M109 Gout, unspecified: Secondary | ICD-10-CM

## 2019-06-22 DIAGNOSIS — R52 Pain, unspecified: Secondary | ICD-10-CM

## 2019-06-22 DIAGNOSIS — Z6841 Body Mass Index (BMI) 40.0 and over, adult: Secondary | ICD-10-CM

## 2019-06-22 DIAGNOSIS — M793 Panniculitis, unspecified: Secondary | ICD-10-CM

## 2019-06-22 DIAGNOSIS — R0602 Shortness of breath: Secondary | ICD-10-CM

## 2019-06-22 LAB — CBC WITH DIFFERENTIAL/PLATELET
Abs Immature Granulocytes: 0.12 10*3/uL — ABNORMAL HIGH (ref 0.00–0.07)
Basophils Absolute: 0 10*3/uL (ref 0.0–0.1)
Basophils Relative: 0 %
Eosinophils Absolute: 0.3 10*3/uL (ref 0.0–0.5)
Eosinophils Relative: 3 %
HCT: 35.8 % — ABNORMAL LOW (ref 36.0–46.0)
Hemoglobin: 10.1 g/dL — ABNORMAL LOW (ref 12.0–15.0)
Immature Granulocytes: 1 %
Lymphocytes Relative: 23 %
Lymphs Abs: 2 10*3/uL (ref 0.7–4.0)
MCH: 28.5 pg (ref 26.0–34.0)
MCHC: 28.2 g/dL — ABNORMAL LOW (ref 30.0–36.0)
MCV: 101.1 fL — ABNORMAL HIGH (ref 80.0–100.0)
Monocytes Absolute: 0.4 10*3/uL (ref 0.1–1.0)
Monocytes Relative: 5 %
Neutro Abs: 6 10*3/uL (ref 1.7–7.7)
Neutrophils Relative %: 68 %
Platelets: 317 10*3/uL (ref 150–400)
RBC: 3.54 MIL/uL — ABNORMAL LOW (ref 3.87–5.11)
RDW: 14.5 % (ref 11.5–15.5)
WBC: 8.9 10*3/uL (ref 4.0–10.5)
nRBC: 0 % (ref 0.0–0.2)

## 2019-06-22 LAB — COMPREHENSIVE METABOLIC PANEL
ALT: 14 U/L (ref 0–44)
AST: 14 U/L — ABNORMAL LOW (ref 15–41)
Albumin: 3 g/dL — ABNORMAL LOW (ref 3.5–5.0)
Alkaline Phosphatase: 56 U/L (ref 38–126)
Anion gap: 11 (ref 5–15)
BUN: 23 mg/dL — ABNORMAL HIGH (ref 6–20)
CO2: 36 mmol/L — ABNORMAL HIGH (ref 22–32)
Calcium: 8.8 mg/dL — ABNORMAL LOW (ref 8.9–10.3)
Chloride: 92 mmol/L — ABNORMAL LOW (ref 98–111)
Creatinine, Ser: 1.3 mg/dL — ABNORMAL HIGH (ref 0.44–1.00)
GFR calc Af Amer: 53 mL/min — ABNORMAL LOW (ref >60)
GFR calc non Af Amer: 46 mL/min — ABNORMAL LOW (ref >60)
Glucose, Bld: 113 mg/dL — ABNORMAL HIGH (ref 70–99)
Potassium: 4.7 mmol/L (ref 3.5–5.1)
Sodium: 139 mmol/L (ref 135–145)
Total Bilirubin: 0.4 mg/dL (ref 0.3–1.2)
Total Protein: 6.7 g/dL (ref 6.5–8.1)

## 2019-06-22 LAB — PHOSPHORUS: Phosphorus: 3.8 mg/dL (ref 2.5–4.6)

## 2019-06-22 LAB — MAGNESIUM: Magnesium: 1.8 mg/dL (ref 1.7–2.4)

## 2019-06-22 NOTE — TOC Progression Note (Signed)
Transition of Care [facility]) - Progression Note    Patient Details  Name: Kara Vincent MRN: [id number] Date of Birth: 03-09-1966  Transition of Care [facility]) CM/SW [place], RN Phone Number: 06/22/2019, 3:06 PM  Clinical Narrative:   Patient requesting DME shower chair and BSC. Also has orders for home Bipap. [employer] has been notified and will set up home Bipap and Bariatric BSC. Unable to provide shower chair due to weight max capacities of 300lbs and patients weight exceeds the limit. [employer] may be able to provide a shower bench with higher weight capacity but this is not covered by insurance. Patient declines further workup for shower bench at this time. Will have BSC and Bipap delivered to patient. Will continue to follow.    Expected Discharge Plan: Home/Self Care(friend to provide 24 hour care) Barriers to Discharge: Continued Medical Work up  Expected Discharge Plan and Services Expected Discharge Plan: Home/Self Care(friend to provide 24 hour care)   Discharge Planning Services: CM Consult   Living arrangements for the past 2 months: Homeless(living in hotel with friend)                   DME Agency: [employer](previously on O2 prior to admit)                   Social Determinants of Health (SDOH) Interventions    Readmission Risk Interventions No flowsheet data found.

## 2019-06-22 NOTE — Progress Notes (Signed)
Pt placed on BIpap at this time.

## 2019-06-22 NOTE — Progress Notes (Signed)
   NAME:  Kara Vincent, MRN:  [id number], DOB:  1966-02-19, LOS: 8 ADMISSION DATE:  06/13/2019, CONSULTATION DATE:  06/18/2019 REFERRING MD:  Alfredia Ferguson CHIEF COMPLAINT:  SOB   Brief History   54 year old woman with hx of morbid obesity, chronic pain/anxiety presenting with acute on chronic hypercarbia.  History of present illness   54 year old woman nonsmoker with hx of morbid obesity, chronic pain/anxiety presenting with acute on chronic hypercarbia.  Has a presumed diagnosis of lower ext cellulitis. She does not like wearing BIPAP but eventually was coerced into wearing it a few hours this AM.  ABG shows worsening retention.  PCCM consulted for further recs.  Patient is anxious and requesting meds by name for this.  Past Medical History  OSA w/o CPAP, chronic O2 need, OHS, homeless, "asthma", hidranitis, anxiety, chronic pain  Significant Hospital Events   3/23 admitted  Consults:  PCCM Procedures:  None  Significant Diagnostic Tests:  CTA chest- obese LE doppler neg  Micro Data:  COVID neg HIV neg MRSA swab neg  Antimicrobials:  Ceftriaxone 3/22-3/29 Cephalexin 3/30>> Interim history/subjective:  Pruritus  Some nausea No other significant complaints Appears somnolent  Objective   Blood pressure 127/84, pulse 71, temperature 98.3 F (36.8 C), temperature source Oral, resp. rate 18, height 5\' 6"  (1.676 m), weight (!) 179.2 kg, last menstrual period 03/24/2009, SpO2 96 %.        Intake/Output Summary (Last 24 hours) at 06/22/2019 1020 Last data filed at 06/22/2019 0700 Gross per 24 hour  Intake 720 ml  Output 300 ml  Net 420 ml   Filed Weights   06/14/19 1400 06/16/19 2129  Weight: (!) 182.8 kg (!) 179.2 kg    Examination: General: Morbidly obese, somnolent but easily arouses HENT: Mallampati 4 Lungs: Clear breath sounds bilaterally, decreased air movement at bases Cardiovascular: S1-S2 appreciated Abdomen: Soft, bowel sounds appreciated  Resolved  Hospital Problem list     Assessment & Plan:  Acute on chronic hypercarbic respiratory failure Obesity hypoventilation syndrome Obstructive sleep apnea Noncompliance with BiPAP -Continue oxygen supplementation  Trying to tolerate BiPAP Continue medications for anxiety  Cellulitis management per primary -Currently on oral antibiotics  Extensive skin rash, itching  May benefit from BiPAP use long-term-Involve social services as patient is currently homeless.  Compliance may be an issue -Most recent ABG noted 7.36/74/110  Continue to promote compliance to BiPAP  Sherrilyn Rist, MD [place] PCCM Pager: [phone]

## 2019-06-22 NOTE — Progress Notes (Deleted)
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  Patient requires the use of NIV both QHS and daytime to help with exacerbation periods.  The use of the NIV will treat patient's high PC02 levels and can reduce risk of exacerbations and future hospitalizations when used at night and during the day. Patient will need these advanced settings in conjunction with current medication regimen; BIPAP is not an option due to its functional limitations and the severity of the patient's condition. Failure to have NIV available for use over a 24 hour period could lead to death. Patient is able to protect their airways and clear secretions on their own.

## 2019-06-22 NOTE — Progress Notes (Signed)
Patient stated she is not ready for bed at this time.

## 2019-06-22 NOTE — Progress Notes (Signed)
Patient is now on [place].   Saturation is 96%

## 2019-06-22 NOTE — Progress Notes (Signed)
Patient currently on PPV and tolerating it well.

## 2019-06-22 NOTE — Progress Notes (Addendum)
[facility]  PROGRESS NOTE  ITZABELLA Vincent OEV:[id number] DOB: Dec 05, 1965 DOA: 06/13/2019 PCP: Ladell Pier, MD Admit date - 06/13/2019   Admitting Physician [name], MD  Outpatient Primary MD for the patient is Ladell Pier, MD  LOS - 8 Brief Narrative   Kara Vincent is a 54 y.o. year old female with medical history significant for morbid obesity, chronic pain/anxiety, chronic hypoxic/hypercarbic respiratory failure on 2 L nasal cannula, obesity hypoventilation syndrome who presented on 06/13/2019 with 1 week of bilateral leg pain and swelling, and shortness of breath and was admitted with working diagnosis of lower extremity cellulitis and panniculitis.  Hospital course : Chest x-ray with concern for atelectasis versus pneumonia, given shortness of breath CTA was obtained which was negative for any PE Dopplers negative for DVT.  Course complicated by worsening CO2 retention resulting in decreased mental status and worsening respiratory function.  With initiation of NIPPV (patient had episodes of adherence) patient clinically improved.  PCCM recommends continuing BiPAP for long-term use as outpatient in setting of OSA/OHS and chronic O2 needs.  Arrangement of DME complicated by patient's home disposition.  Subjective  Today declined NIPPV overnight but was adherent to therapy this a.m. still a bit sleepy but arousable.  Reports slight improvement in lower leg pain and swelling.  Would like prescriptions for anxiety/pain medicines once able to go home  A & P   Acute on chronic hypoxic and hypercarbic respiratory failure, improving.  At baseline patient is on 3L, currently on 4 L with SPO2 97%.  Likely multifactorial related to OSA, obesity hypoventilation syndrome/asthma.  Patient continues to exhibit signs of hypercapnia related to  secondary to severe restrictive thoracic disorder due to obesity hypoventilation syndrome. Patient requires the use of NIV both  QHS and daytime to help with exacerbation periods. The use of the NIV will treat patient's high PC02 levels and can reduce risk of exacerbations and future hospitalizations when used at night and during the day. Patient will need these advanced settings in conjunction with current medication regimen. Failure to have NIV available for use over a 24 hour period could lead to death. Patient is able to protect their airways and clear secretions on their own.  -Environmental education officer, need assistance for long-term NIV-wean O2 to home regimen range -Continue to encourage BiPAP overall -Pulmicort, Brovana  Bilateral lower extremity cellulitis/panniculitis, improving -Remains afebrile, rash improving on Keflex will complete 10-day course.  Myoclonic jerking, resolved.  In the setting of hypercarbia.  Neurology evaluated and agree with most likely related to elevated CO2 due to above.-Continue to monitor, treating hypercarbia with NIPPV  HTN, stable -Continue clonidine, Imdur, lisinopril  CKD stage III, stable -Avoid nephrotoxins, continue to closely monitor on home Lasix and lisinopril  Anemia of chronic kidney disease, stable -Monitor hemoglobin on CBC  Bedbugs -contact precautions  History of gout, stable-continue home allopurinol  History of sleep apnea has not been adherent as patient lost CPAP, continue CPAP at bedtime  Morbid obesity BMI 63.77 -Weight loss/dietary counseling provided during hospital stay.  Chronic pain -Tylenol as needed every 8 hours, Percocet 2 tabs as needed every 6 hours, closely monitor mental status, avoid narcotics given somnolence -Emphasized no outpatient prescriptions were provided, when he discussed with primary care physician -Cymbalta-continue bowel regimen with MiraLAX twice daily and senna docusate   Family Communication  : None  Code Status : Full  Disposition Plan  :  Patient is from home. Anticipated d/c date:  4  to 48 hours. Barriers to d/c or  necessity for inpatient status:  Able to have stable mental status with adherence to NIPPV, safe arrangement of BiPAP on discharge, but case management) and O2. Consults  : CCM  Procedures  : 06/20/19, TTE.  EF 60-65%: 3/23 bilateral venous duplex DVTs  DVT Prophylaxis  : Heparin  Lab Results  Component Value Date   PLT 317 06/22/2019    Diet :  Diet Order            Diet regular Room service appropriate? Yes; Fluid consistency: Thin  Diet effective now               Inpatient Medications Scheduled Meds: . allopurinol  100 mg Oral Daily  . ALPRAZolam  0.5 mg Oral TID  . arformoterol  15 mcg Nebulization BID  . budesonide (PULMICORT) nebulizer solution  0.25 mg Nebulization BID  . carbamide peroxide  5 drop Both EARS BID  . cephALEXin  500 mg Oral Q6H  . cloNIDine  0.2 mg Oral BID  . darifenacin  7.5 mg Oral Daily  . DULoxetine  30 mg Oral Daily  . heparin injection (subcutaneous)  5,000 Units Subcutaneous Q8H  . isosorbide mononitrate  30 mg Oral Daily  . mouth rinse  15 mL Mouth Rinse BID  . omega-3 acid ethyl esters  1 g Oral Daily  . pantoprazole  40 mg Oral Daily  . polyethylene glycol  17 g Oral BID  . senna-docusate  1 tablet Oral BID  . triamcinolone cream   Topical BID  . vitamin E  100 Units Oral Daily   Continuous Infusions: PRN Meds:.acetaminophen **OR** acetaminophen, acetaminophen-codeine, albuterol, bisacodyl, guaiFENesin-dextromethorphan, hydrOXYzine, ondansetron (ZOFRAN) IV, oxyCODONE-acetaminophen  Antibiotics  :   Anti-infectives (From admission, onward)   Start     Dose/Rate Route Frequency Ordered Stop   06/21/19 1800  cephALEXin (KEFLEX) capsule 500 mg     500 mg Oral Every 6 hours 06/21/19 1236 06/23/19 2359   06/14/19 2000  cefTRIAXone (ROCEPHIN) 1 g in sodium chloride 0.9 % 100 mL IVPB  Status:  Discontinued     1 g 200 mL/hr over 30 Minutes Intravenous Every 24 hours 06/14/19 0756 06/21/19 1236   06/14/19 0430  vancomycin (VANCOREADY) IVPB  2000 mg/400 mL     2,000 mg 200 mL/hr over 120 Minutes Intravenous  Once 06/14/19 0430 06/14/19 0700   06/14/19 0400  cefTRIAXone (ROCEPHIN) 1 g in sodium chloride 0.9 % 100 mL IVPB     1 g 200 mL/hr over 30 Minutes Intravenous  Once 06/14/19 0346 06/14/19 0450       Objective   Vitals:   06/22/19 0047 06/22/19 0333 06/22/19 0604 06/22/19 0747  BP: 126/75 126/81    Pulse: 78  72   Resp:   20   Temp: 98.1 F (36.7 C) 98 F (36.7 C)    TempSrc: Oral Axillary    SpO2: 98%  96% 98%  Weight:      Height:        SpO2: 98 % O2 Flow Rate (L/min): 4 L/min FiO2 (%): 40 %  Wt Readings from Last 3 Encounters:  06/16/19 (!) 179.2 kg  04/14/19 (!) 176.9 kg  12/17/18 (!) 177.8 kg     Intake/Output Summary (Last 24 hours) at 06/22/2019 0822 Last data filed at 06/22/2019 0615 Gross per 24 hour  Intake 720 ml  Output --  Net 720 ml    Physical Exam:  Awake Alert, Oriented X 3, Normal affect No new F.N deficits,  [place].AT, Normal respiratory effort on 4 L, decreased breath sounds at base, CTAB RRR,No Gallops,Rubs or new Murmurs,  Obese abdomen, +ve B.Sounds, Abd Soft, No tenderness, No rebound, guarding or rigidity. Mild swelling bilateral lower extremities, no overt redness, no tenderness   I have personally reviewed the following:   Data Reviewed:  CBC Recent Labs  Lab 06/18/19 0353 06/19/19 0207 06/20/19 0253 06/21/19 0252 06/22/19 0215  WBC 9.9 9.2 9.7 9.6 8.9  HGB 9.8* 9.7* 9.9* 10.2* 10.1*  HCT 34.7* 34.2* 35.0* 36.0 35.8*  PLT 323 309 324 322 317  MCV 103.0* 102.1* 101.7* 102.0* 101.1*  MCH 29.1 29.0 28.8 28.9 28.5  MCHC 28.2* 28.4* 28.3* 28.3* 28.2*  RDW 14.4 14.0 14.1 14.3 14.5  LYMPHSABS 1.6 1.5 2.0 1.9 2.0  MONOABS 0.6 0.4 0.4 0.4 0.4  EOSABS 0.3 0.3 0.3 0.3 0.3  BASOSABS 0.1 0.0 0.1 0.0 0.0    Chemistries  Recent Labs  Lab 06/18/19 0353 06/19/19 0207 06/20/19 0253 06/21/19 0252 06/22/19 0215  NA 142 141 143 143 139  K 5.2* 5.3* 5.1  5.0 4.7  CL 97* 95* 95* 94* 92*  CO2 37* 40* 38* 39* 36*  GLUCOSE 96 113* 118* 115* 113*  BUN 30* 27* 21* 21* 23*  CREATININE 1.98* 1.32* 1.28* 1.57* 1.30*  CALCIUM 8.4* 8.6* 8.6* 8.7* 8.8*  MG 1.7 2.1 1.9 1.8 1.8  AST 9* 11* 11* 13* 14*  ALT 12 12 11 13 14   ALKPHOS 54 53 51 55 56  BILITOT 0.5 0.5 0.7 0.5 0.4   ------------------------------------------------------------------------------------------------------------------ No results for input(s): CHOL, HDL, LDLCALC, TRIG, CHOLHDL, LDLDIRECT in the last 72 hours.  Lab Results  Component Value Date   HGBA1C 5.5 12/01/2017   ------------------------------------------------------------------------------------------------------------------ No results for input(s): TSH, T4TOTAL, T3FREE, THYROIDAB in the last 72 hours.  Invalid input(s): FREET3 ------------------------------------------------------------------------------------------------------------------ No results for input(s): VITAMINB12, FOLATE, FERRITIN, TIBC, IRON, RETICCTPCT in the last 72 hours.  Coagulation profile No results for input(s): INR, PROTIME in the last 168 hours.  No results for input(s): DDIMER in the last 72 hours.  Cardiac Enzymes No results for input(s): CKMB, TROPONINI, MYOGLOBIN in the last 168 hours.  Invalid input(s): CK ------------------------------------------------------------------------------------------------------------------    Component Value Date/Time   BNP 120.9 (H) 06/14/2019 2671    Micro Results Recent Results (from the past 240 hour(s))  SARS CORONAVIRUS 2 (TAT 6-24 HRS) Nasopharyngeal Nasopharyngeal Swab     Status: None   Collection Time: 06/14/19  5:15 AM   Specimen: Nasopharyngeal Swab  Result Value Ref Range Status   SARS Coronavirus 2 NEGATIVE NEGATIVE Final    Comment: (NOTE) SARS-CoV-2 target nucleic acids are NOT DETECTED. The SARS-CoV-2 RNA is generally detectable in upper and lower respiratory specimens during  the acute phase of infection. Negative results do not preclude SARS-CoV-2 infection, do not rule out co-infections with other pathogens, and should not be used as the sole basis for treatment or other patient management decisions. Negative results must be combined with clinical observations, patient history, and epidemiological information. The expected result is Negative. Fact Sheet for Patients: SugarRoll.be Fact Sheet for Healthcare Providers: https://www.woods-mathews.com/ This test is not yet approved or cleared by the [place] FDA and  has been authorized for detection and/or diagnosis of SARS-CoV-2 by FDA under an Emergency Use Authorization (EUA). This EUA will remain  in effect (meaning this test can be used) for the duration of the COVID-19 declaration under  Section 56 4(b)(1) of the Act, 21 U.S.C. section 360bbb-3(b)(1), unless the authorization is terminated or revoked sooner. Performed at [facility], [place] [address] [place], [place] [address]   MRSA PCR Screening     Status: None   Collection Time: 06/14/19  4:25 PM   Specimen: Nasal Mucosa; Nasopharyngeal  Result Value Ref Range Status   MRSA by PCR NEGATIVE NEGATIVE Final    Comment:        The GeneXpert MRSA Assay (FDA approved for NASAL specimens only), is one component of a comprehensive MRSA colonization surveillance program. It is not intended to diagnose MRSA infection nor to guide or monitor treatment for MRSA infections. Performed at [facility], [place] [address] [place], [place] [address]     Radiology Reports DG Chest 2 View  Result Date: 06/13/2019 CLINICAL DATA:  Shortness of breath. Patient reports bilateral lower leg pain, redness and swelling. EXAM: CHEST - 2 VIEW COMPARISON:  CT angiogram chest 03/12/2019, chest radiograph 03/12/2019 FINDINGS: Limited evaluation due to patient body habitus and shallow inspiration. Mild cardiomegaly  appears unchanged. Mild ill-defined opacity within the lateral right lung base. No evidence of pleural effusion or pneumothorax. No acute bony abnormality. IMPRESSION: Limited examination as described. Shallow inspiration radiograph. Mild ill-defined opacity within the lateral right lung base which may reflect atelectasis. Pneumonia cannot be excluded. Unchanged mild cardiomegaly. Electronically Signed   By: Kellie Simmering DO   On: 06/13/2019 19:53   DG Tibia/Fibula Left  Result Date: 06/13/2019 CLINICAL DATA:  Bilateral lower leg pain. Redness and swelling. EXAM: LEFT TIBIA AND FIBULA - 2 VIEW COMPARISON:  None. FINDINGS: Cortical margins of the tibia and fibula are intact. No fracture. No bony destruction or periosteal reaction. Moderate osteoarthritis of the knee joint. Mild osteoarthritis of the ankle. Generalized soft tissue edema and soft tissue prominence. No soft tissue air or radiopaque foreign body. Multiple phleboliths in the soft tissues. IMPRESSION: Generalized soft tissue edema.  No acute osseous abnormality. Electronically Signed   By: Keith Rake M.D.   On: 06/13/2019 19:51   DG Tibia/Fibula Right  Result Date: 06/13/2019 CLINICAL DATA:  Bilateral lower leg pain. Redness and swelling. EXAM: RIGHT TIBIA AND FIBULA - 2 VIEW COMPARISON:  None. FINDINGS: Cortical margins of the tibia and fibula are intact. No fracture. No bony destruction or periosteal reaction. Moderate osteoarthritis of the knee joint. Mild osteoarthritis of the ankle. Generalized soft tissue edema and soft tissue prominence. No soft tissue air or radiopaque foreign body. Multiple phleboliths in the soft tissues. IMPRESSION: Generalized soft tissue edema. No acute osseous abnormality. Electronically Signed   By: Keith Rake M.D.   On: 06/13/2019 19:50   DG Abd 1 View  Result Date: 06/16/2019 CLINICAL DATA:  Abdominal pain with nausea and headaches. EXAM: ABDOMEN - 1 VIEW COMPARISON:  Abdominal radiographs CT  abdomen/pelvis 11/28/2011. 12/27/2011 FINDINGS: Air distention of a nondescript bowel loop within the right hemiabdomen. Large colonic stool burden within the right colon and distal colon/rectum. No acute bony abnormality. Thoracic spondylosis. IMPRESSION: Air distention of nondescript bowel loop within the right hemiabdomen, nonspecific. Large stool burden within the right colon and distal colon/rectum. Electronically Signed   By: Kellie Simmering DO   On: 06/16/2019 09:08   CT Angio Chest PE W and/or Wo Contrast  Result Date: 06/14/2019 CLINICAL DATA:  Right leg pain and redness with swelling for 1 week EXAM: CT ANGIOGRAPHY CHEST WITH CONTRAST TECHNIQUE: Multidetector CT imaging of the chest was  performed using the standard protocol during bolus administration of intravenous contrast. Multiplanar CT image reconstructions and MIPs were obtained to evaluate the vascular anatomy. CONTRAST:  35mL OMNIPAQUE IOHEXOL 350 MG/ML SOLN COMPARISON:  03/12/2019 FINDINGS: Cardiovascular: Limited opacification of the pulmonary arteries due to body habitus and bolus dispersion. No pulmonary embolism is seen, definitely nondiagnostic beyond the main and lobar level. Coronary calcification. No cardiomegaly or pericardial effusion. Mild atherosclerotic calcification. Mediastinum/Nodes: Negative for adenopathy or mass. Lungs/Pleura: Linear atelectasis/scarring. There is no edema, consolidation, effusion, or pneumothorax. Symmetric narrowing of the bilateral mainstem bronchi, likely bronchomalacia. Upper Abdomen: No acute finding Musculoskeletal: Spondylosis.  No acute or aggressive finding Review of the MIP images confirms the above findings. IMPRESSION: 1. Severely limited chest CTA due to body habitus and bolus dispersion. No pulmonary embolism is seen at the main or lobar levels. 2.  Aortic Atherosclerosis (ICD10-I70.0).  Coronary atherosclerosis Electronically Signed   By: Monte Fantasia M.D.   On: 06/14/2019 04:08   DG CHEST  PORT 1 VIEW  Result Date: 06/21/2019 CLINICAL DATA:  Shortness of breath EXAM: PORTABLE CHEST 1 VIEW COMPARISON:  June 20, 2019 FINDINGS: Mild bibasilar atelectasis. No edema or consolidation. There is cardiomegaly with a degree of pulmonary vascular congestion. IMPRESSION: No active disease. Electronically Signed   By: Lowella Grip III M.D.   On: 06/21/2019 08:57   DG CHEST PORT 1 VIEW  Result Date: 06/20/2019 CLINICAL DATA:  Shortness of breath EXAM: PORTABLE CHEST 1 VIEW COMPARISON:  Yesterday FINDINGS: Cardiomegaly and vascular pedicle widening accentuated by rotation. Low volume chest with diffuse vascular congestion. Interstitial prominence without Kerley lines. No definite effusion. IMPRESSION: CHF pattern.  Vascular congestion appears increased from yesterday. Electronically Signed   By: Monte Fantasia M.D.   On: 06/20/2019 07:14   DG CHEST PORT 1 VIEW  Result Date: 06/19/2019 CLINICAL DATA:  Shortness of breath EXAM: PORTABLE CHEST 1 VIEW COMPARISON:  06/18/2019 FINDINGS: Bilateral interstitial prominence. Lingular airspace disease which may reflect atelectasis versus pneumonia. No pleural effusion or pneumothorax. Stable cardiomediastinal silhouette. No aggressive osseous lesion. IMPRESSION: Lingular airspace disease which may reflect atelectasis versus pneumonia. Cardiomegaly with pulmonary vascular congestion. Electronically Signed   By: Kathreen Devoid   On: 06/19/2019 07:58   DG CHEST PORT 1 VIEW  Result Date: 06/18/2019 CLINICAL DATA:  Shortness of breath. Cellulitis of lower extremity EXAM: PORTABLE CHEST 1 VIEW COMPARISON:  June 13, 2019 FINDINGS: The mediastinal contour is stable. Heart size is enlarged. Increased pulmonary interstitium is identified bilaterally. No focal pneumonia or pleural effusion is identified. The bony structures are stable. IMPRESSION: Congestive heart failure. Electronically Signed   By: Abelardo Diesel M.D.   On: 06/18/2019 11:25   EEG adult  Result  Date: 06/17/2019 Lora Havens, MD     06/17/2019  5:12 PM Patient Name: Kara Vincent MRN: [id number] Epilepsy Attending: Lora Havens Referring Physician/Provider: Dr. Osborne Oman Date: 06/17/2019 Duration: 21.55 mins Patient history: 54 year old female with jerking movements in arms.  EEG to evaluate for seizures. Level of alertness: Awake and asleep AEDs during EEG study: None Technical aspects: This EEG study was done with scalp electrodes positioned according to the 10-20 International system of electrode placement. Electrical activity was acquired at a sampling rate of 500Hz  and reviewed with a high frequency filter of 70Hz  and a low frequency filter of 1Hz . EEG data were recorded continuously and digitally stored. Description: The posterior dominant rhythm consists of 8 Hz activity of moderate voltage (25-35 uV) seen  predominantly in posterior head regions, symmetric and reactive to eye opening and eye closing. Sleep was characterized by generalized 4-6hz  theta-delta slowing. Hyperventilation and photic stimulation were not performed. IMPRESSION: This study is within normal limits. No seizures or epileptiform discharges were seen throughout the recording. Lora Havens   ECHOCARDIOGRAM COMPLETE  Result Date: 06/20/2019    ECHOCARDIOGRAM REPORT   Patient Name:   MIRABELLE CYPHERS Date of Exam: 06/20/2019 Medical Rec #:  [id number]         Height:       66.0 in Accession #:    [id number]        Weight:       395.1 lb Date of Birth:  12-10-1965          BSA:          2.671 m Patient Age:    89 years          BP:           114/71 mmHg Patient Gender: F                 HR:           76 bpm. Exam Location:  Inpatient Procedure: 2D Echo, Color Doppler and Cardiac Doppler Indications:    R06.9 DOE  History:        Patient has prior history of Echocardiogram examinations, most                 recent 07/09/2017. CHF; Risk Factors:Sleep Apnea, Hypertension                 and Dyslipidemia.  Sonographer:     Raquel Sarna Senior RDCS Referring Phys: [id number] Georgina Quint LATIF [facility]  Sonographer Comments: Technically difficult study with very poor image quality due to morbid obesity and patient being unable to tolerate probe pressure. IMPRESSIONS  1. Technically difficult study with limited views. Grossly normal LV systolic function. Left ventricular ejection fraction, by estimation, is 60 to 65%. Left ventricular diastolic parameters are indeterminate.  2. Right ventricular systolic function was not well visualized. The right ventricular size is not well visualized.  3. The mitral valve was not well visualized. No evidence of mitral valve regurgitation.  4. The aortic valve was not well visualized. Aortic valve regurgitation is not visualized. No aortic stenosis is present.  5. The inferior vena cava is dilated in size with >50% respiratory variability, suggesting right atrial pressure of 8 mmHg. FINDINGS  Left Ventricle: Left ventricular ejection fraction, by estimation, is 60 to 65%. The left ventricle has normal function. The left ventricle has no regional wall motion abnormalities. The left ventricular internal cavity size was normal in size. There is  Not well visualized left ventricular hypertrophy. Left ventricular diastolic parameters are indeterminate. Right Ventricle: The right ventricular size is not well visualized. Right vetricular wall thickness was not assessed. Right ventricular systolic function was not well visualized. Left Atrium: Left atrial size was not well visualized. Right Atrium: Right atrial size was not well visualized. Pericardium: Trivial pericardial effusion is present. Presence of pericardial fat pad. Mitral Valve: The mitral valve was not well visualized. No evidence of mitral valve regurgitation. Tricuspid Valve: The tricuspid valve is not well visualized. Tricuspid valve regurgitation is not demonstrated. Aortic Valve: The aortic valve was not well visualized. Aortic valve regurgitation is not  visualized. No aortic stenosis is present. Pulmonic Valve: The pulmonic valve was not well visualized. Pulmonic valve regurgitation is not visualized. Aorta: The  aortic root was not well visualized. Venous: The inferior vena cava is dilated in size with greater than 50% respiratory variability, suggesting right atrial pressure of 8 mmHg. IAS/Shunts: The interatrial septum was not well visualized.  AORTIC VALVE LVOT Vmax:   116.00 cm/s LVOT Vmean:  63.900 cm/s LVOT VTI:    0.209 m  SHUNTS Systemic VTI: 0.21 m Oswaldo Milian MD Electronically signed by Oswaldo Milian MD Signature Date/Time: 06/20/2019/2:30:30 PM    Final    VAS [place] LOWER EXTREMITY VENOUS (DVT)  Result Date: 06/14/2019  Lower Venous DVTStudy Indications: Edema.  Limitations: Body habitus and poor ultrasound/tissue interface. Comparison Study: 07/07/17 previous Performing Technologist: Abram Sander RVS  Examination Guidelines: A complete evaluation includes B-mode imaging, spectral Doppler, color Doppler, and power Doppler as needed of all accessible portions of each vessel. Bilateral testing is considered an integral part of a complete examination. Limited examinations for reoccurring indications may be performed as noted. The reflux portion of the exam is performed with the patient in reverse Trendelenburg.  +---------+---------------+---------+-----------+----------+--------------+ RIGHT    CompressibilityPhasicitySpontaneityPropertiesThrombus Aging +---------+---------------+---------+-----------+----------+--------------+ CFV      Full           Yes      Yes                                 +---------+---------------+---------+-----------+----------+--------------+ SFJ      Full                                                        +---------+---------------+---------+-----------+----------+--------------+ FV Prox  Full                                                         +---------+---------------+---------+-----------+----------+--------------+ FV Mid   Full                                                        +---------+---------------+---------+-----------+----------+--------------+ FV DistalFull                                                        +---------+---------------+---------+-----------+----------+--------------+ PFV      Full                                                        +---------+---------------+---------+-----------+----------+--------------+ POP      Full           Yes      Yes                                 +---------+---------------+---------+-----------+----------+--------------+  PTV                                                   Not visualized +---------+---------------+---------+-----------+----------+--------------+ PERO                                                  Not visualized +---------+---------------+---------+-----------+----------+--------------+   +---------+---------------+---------+-----------+----------+--------------+ LEFT     CompressibilityPhasicitySpontaneityPropertiesThrombus Aging +---------+---------------+---------+-----------+----------+--------------+ CFV      Full           Yes      Yes                                 +---------+---------------+---------+-----------+----------+--------------+ SFJ      Full                                                        +---------+---------------+---------+-----------+----------+--------------+ FV Prox  Full                                                        +---------+---------------+---------+-----------+----------+--------------+ FV Mid   Full                                                        +---------+---------------+---------+-----------+----------+--------------+ FV DistalFull                                                         +---------+---------------+---------+-----------+----------+--------------+ PFV      Full                                                        +---------+---------------+---------+-----------+----------+--------------+ POP      Full           Yes      Yes                                 +---------+---------------+---------+-----------+----------+--------------+ PTV      Full                                                        +---------+---------------+---------+-----------+----------+--------------+  PERO                                                  Not visualized +---------+---------------+---------+-----------+----------+--------------+     Summary: BILATERAL: - No evidence of deep vein thrombosis seen in the lower extremities, bilaterally.   *See table(s) above for measurements and observations. Electronically signed by Deitra Mayo MD on 06/14/2019 at 1:09:05 PM.    Final      Time Spent in minutes  30     Desiree Hane M.D on 06/22/2019 at 8:22 AM  To page go to [email or web address] - password [email or web address]

## 2019-06-23 MED ORDER — ALPRAZOLAM 0.5 MG PO TABS
0.5000 mg | ORAL_TABLET | Freq: Three times a day (TID) | ORAL | Status: DC | PRN
  Administered 2019-06-23 – 2019-06-30 (×12): 0.5 mg via ORAL
  Filled 2019-06-23 (×12): qty 1

## 2019-06-23 MED ORDER — OXYCODONE-ACETAMINOPHEN 5-325 MG PO TABS
1.0000 | ORAL_TABLET | Freq: Four times a day (QID) | ORAL | Status: DC | PRN
  Administered 2019-06-23 – 2019-06-30 (×20): 1 via ORAL
  Filled 2019-06-23 (×20): qty 1

## 2019-06-23 NOTE — Progress Notes (Signed)
[facility]  PROGRESS NOTE  Kara Vincent FUX:[id number] DOB: [birth year] DOA: 06/13/2019 PCP: Ladell Pier, MD Admit date - 06/13/2019   Admitting Physician [name], MD  Outpatient Primary MD for the patient is Ladell Pier, MD  LOS - 9 Brief Narrative   Kara Vincent is a 54 y.o. year old female with medical history significant for morbid obesity, chronic pain/anxiety, chronic hypoxic/hypercarbic respiratory failure on 2 L nasal cannula, obesity hypoventilation syndrome who presented on 06/13/2019 with 1 week of bilateral leg pain and swelling, and shortness of breath and was admitted with working diagnosis of lower extremity cellulitis and panniculitis.  Hospital course : Chest x-ray with concern for atelectasis versus pneumonia, given shortness of breath CTA was obtained which was negative for any PE and Dopplers negative for DVT.  Course complicated by worsening CO2 retention resulting in decreased mental status and worsening respiratory function.  With initiation of NIPPV (patient had episodes of adherence) patient clinically improved.  PCCM recommends continuing BiPAP for long-term use as outpatient in setting of OSA/OHS and chronic O2 needs.  Arrangement of DME complicated by patient's home disposition.  Subjective  Today date NIPPV overnight issues.  Still somnolent this a.m. but arousable.  Reports cough productive of sputum  A & P   Acute on chronic hypoxic and hypercarbic respiratory failure, improving.  At baseline patient is on 3L, currently on 4 L with SPO2 97%.  Likely multifactorial related to OSA, obesity hypoventilation syndrome/asthma. Patient  responding well to NIPPV in hospital.  Still having productive cough -Add incentive spirometry, flutter valve -Patient greatly benefits from BiPAP, PCCM recommends continue long-term management -Unable to obtain referral for home BiPAP per TOC due to home disposition (bedbugs per agency), will need  outpatient sleep study, however per PCCM needs long term to prevent CO2 retention -Pulmicort, Brovana  Bilateral lower extremity cellulitis/panniculitis, improving -Remains afebrile, rash improving on Keflex will complete 10-day course.  Myoclonic jerking, resolved.  In the setting of hypercarbia.  Neurology evaluated and agree with most likely related to elevated CO2 due to above.-Continue to monitor, treating hypercarbia with NIPPV  HTN, stable -Continue clonidine, Imdur, lisinopril  CKD stage III, stable -Avoid nephrotoxins, continue to closely monitor on home Lasix and lisinopril  Anemia of chronic kidney disease, stable -Monitor hemoglobin on CBC  Bedbugs -contact precautions  History of gout, stable -continue home allopurinol  History of sleep apnea has not been adherent as patient lost CPAP, continue CPAP at bedtime  Morbid obesity BMI 63.77 -Weight loss/dietary counseling provided during hospital stay.  Chronic pain -Tylenol as needed every 8 hours, Percocet 2 tabs as needed every 6 hours, closely monitor mental status, avoid narcotics given somnolence -Emphasized no outpatient prescriptions were provided, when he discussed with primary care physician -Cymbalta-continue bowel regimen with MiraLAX twice daily and senna docusate   Family Communication  : Spouse updated at bedside on 4/1  Code Status : Full  Disposition Plan  :  Patient is from home. Anticipated d/c date:  24 to 48 hours. Barriers to d/c or necessity for inpatient status:  Able to have stable mental status with adherence to NIPPV, safe arrangement of BiPAP on discharge, per case management) and O2. Consults  : CCM  Procedures  : 06/20/19, TTE.  EF 60-65%: 3/23 bilateral venous duplex DVTs  DVT Prophylaxis  : Heparin  Lab Results  Component Value Date   PLT 317 06/22/2019    Diet :  Diet Order  Diet regular Room service appropriate? Yes; Fluid consistency: Thin  Diet effective now                Inpatient Medications Scheduled Meds: . allopurinol  100 mg Oral Daily  . ALPRAZolam  0.5 mg Oral TID  . arformoterol  15 mcg Nebulization BID  . budesonide (PULMICORT) nebulizer solution  0.25 mg Nebulization BID  . carbamide peroxide  5 drop Both EARS BID  . cephALEXin  500 mg Oral Q6H  . cloNIDine  0.2 mg Oral BID  . darifenacin  7.5 mg Oral Daily  . DULoxetine  30 mg Oral Daily  . heparin injection (subcutaneous)  5,000 Units Subcutaneous Q8H  . isosorbide mononitrate  30 mg Oral Daily  . mouth rinse  15 mL Mouth Rinse BID  . omega-3 acid ethyl esters  1 g Oral Daily  . pantoprazole  40 mg Oral Daily  . polyethylene glycol  17 g Oral BID  . senna-docusate  1 tablet Oral BID  . triamcinolone cream   Topical BID  . vitamin E  100 Units Oral Daily   Continuous Infusions: PRN Meds:.acetaminophen **OR** acetaminophen, acetaminophen-codeine, albuterol, bisacodyl, guaiFENesin-dextromethorphan, hydrOXYzine, ondansetron (ZOFRAN) IV, oxyCODONE-acetaminophen  Antibiotics  :   Anti-infectives (From admission, onward)   Start     Dose/Rate Route Frequency Ordered Stop   06/21/19 1800  cephALEXin (KEFLEX) capsule 500 mg     500 mg Oral Every 6 hours 06/21/19 1236 06/23/19 2359   06/14/19 2000  cefTRIAXone (ROCEPHIN) 1 g in sodium chloride 0.9 % 100 mL IVPB  Status:  Discontinued     1 g 200 mL/hr over 30 Minutes Intravenous Every 24 hours 06/14/19 0756 06/21/19 1236   06/14/19 0430  vancomycin (VANCOREADY) IVPB 2000 mg/400 mL     2,000 mg 200 mL/hr over 120 Minutes Intravenous  Once 06/14/19 0430 06/14/19 0700   06/14/19 0400  cefTRIAXone (ROCEPHIN) 1 g in sodium chloride 0.9 % 100 mL IVPB     1 g 200 mL/hr over 30 Minutes Intravenous  Once 06/14/19 0346 06/14/19 0450       Objective   Vitals:   06/22/19 2011 06/22/19 2334 06/22/19 2340 06/23/19 0829  BP:   116/66   Pulse:  72 94   Resp:  15 19   Temp:   98.2 F (36.8 C)   TempSrc:   Axillary   SpO2: 93%  95% 96% 97%  Weight:      Height:        SpO2: 97 % O2 Flow Rate (L/min): 3.5 L/min FiO2 (%): 40 %  Wt Readings from Last 3 Encounters:  06/16/19 (!) 179.2 kg  04/14/19 (!) 176.9 kg  12/17/18 (!) 177.8 kg     Intake/Output Summary (Last 24 hours) at 06/23/2019 1655 Last data filed at 06/23/2019 0300 Gross per 24 hour  Intake --  Output 850 ml  Net -850 ml    Physical Exam:     Sleepy, but arousable, Oriented X 3, Normal affect No new F.N deficits,  [place].AT, Normal respiratory effort on 3-4 L, decreased breath sounds at base, CTAB RRR,No Gallops,Rubs or new Murmurs,  Obese abdomen, +ve B.Sounds, Abd Soft, No tenderness, No rebound, guarding or rigidity. 1+ pitting edema of bilateral lower extremities to calves, no overt redness, no tenderness Scattered abrasions   I have personally reviewed the following:   Data Reviewed:  CBC Recent Labs  Lab 06/18/19 0353 06/19/19 0207 06/20/19 0253 06/21/19 0252 06/22/19 0215  WBC  9.9 9.2 9.7 9.6 8.9  HGB 9.8* 9.7* 9.9* 10.2* 10.1*  HCT 34.7* 34.2* 35.0* 36.0 35.8*  PLT 323 309 324 322 317  MCV 103.0* 102.1* 101.7* 102.0* 101.1*  MCH 29.1 29.0 28.8 28.9 28.5  MCHC 28.2* 28.4* 28.3* 28.3* 28.2*  RDW 14.4 14.0 14.1 14.3 14.5  LYMPHSABS 1.6 1.5 2.0 1.9 2.0  MONOABS 0.6 0.4 0.4 0.4 0.4  EOSABS 0.3 0.3 0.3 0.3 0.3  BASOSABS 0.1 0.0 0.1 0.0 0.0    Chemistries  Recent Labs  Lab 06/18/19 0353 06/19/19 0207 06/20/19 0253 06/21/19 0252 06/22/19 0215  NA 142 141 143 143 139  K 5.2* 5.3* 5.1 5.0 4.7  CL 97* 95* 95* 94* 92*  CO2 37* 40* 38* 39* 36*  GLUCOSE 96 113* 118* 115* 113*  BUN 30* 27* 21* 21* 23*  CREATININE 1.98* 1.32* 1.28* 1.57* 1.30*  CALCIUM 8.4* 8.6* 8.6* 8.7* 8.8*  MG 1.7 2.1 1.9 1.8 1.8  AST 9* 11* 11* 13* 14*  ALT 12 12 11 13 14   ALKPHOS 54 53 51 55 56  BILITOT 0.5 0.5 0.7 0.5 0.4    ------------------------------------------------------------------------------------------------------------------ No results for input(s): CHOL, HDL, LDLCALC, TRIG, CHOLHDL, LDLDIRECT in the last 72 hours.  Lab Results  Component Value Date   HGBA1C 5.5 12/01/2017   ------------------------------------------------------------------------------------------------------------------ No results for input(s): TSH, T4TOTAL, T3FREE, THYROIDAB in the last 72 hours.  Invalid input(s): FREET3 ------------------------------------------------------------------------------------------------------------------ No results for input(s): VITAMINB12, FOLATE, FERRITIN, TIBC, IRON, RETICCTPCT in the last 72 hours.  Coagulation profile No results for input(s): INR, PROTIME in the last 168 hours.  No results for input(s): DDIMER in the last 72 hours.  Cardiac Enzymes No results for input(s): CKMB, TROPONINI, MYOGLOBIN in the last 168 hours.  Invalid input(s): CK ------------------------------------------------------------------------------------------------------------------    Component Value Date/Time   BNP 120.9 (H) 06/14/2019 0240    Micro Results Recent Results (from the past 240 hour(s))  SARS CORONAVIRUS 2 (TAT 6-24 HRS) Nasopharyngeal Nasopharyngeal Swab     Status: None   Collection Time: 06/14/19  5:15 AM   Specimen: Nasopharyngeal Swab  Result Value Ref Range Status   SARS Coronavirus 2 NEGATIVE NEGATIVE Final    Comment: (NOTE) SARS-CoV-2 target nucleic acids are NOT DETECTED. The SARS-CoV-2 RNA is generally detectable in upper and lower respiratory specimens during the acute phase of infection. Negative results do not preclude SARS-CoV-2 infection, do not rule out co-infections with other pathogens, and should not be used as the sole basis for treatment or other patient management decisions. Negative results must be combined with clinical observations, patient history, and  epidemiological information. The expected result is Negative. Fact Sheet for Patients: SugarRoll.be Fact Sheet for Healthcare Providers: https://www.woods-mathews.com/ This test is not yet approved or cleared by the [place] FDA and  has been authorized for detection and/or diagnosis of SARS-CoV-2 by FDA under an Emergency Use Authorization (EUA). This EUA will remain  in effect (meaning this test can be used) for the duration of the COVID-19 declaration under Section 56 4(b)(1) of the Act, 21 U.S.C. section 360bbb-3(b)(1), unless the authorization is terminated or revoked sooner. Performed at [facility], [place] [address], [place], [place] [address]   MRSA PCR Screening     Status: None   Collection Time: 06/14/19  4:25 PM   Specimen: Nasal Mucosa; Nasopharyngeal  Result Value Ref Range Status   MRSA by PCR NEGATIVE NEGATIVE Final    Comment:        The GeneXpert MRSA  Assay (FDA approved for NASAL specimens only), is one component of a comprehensive MRSA colonization surveillance program. It is not intended to diagnose MRSA infection nor to guide or monitor treatment for MRSA infections. Performed at [facility], [place] [address], [place], [place] [address]     Radiology Reports DG Chest 2 View  Result Date: 06/13/2019 CLINICAL DATA:  Shortness of breath. Patient reports bilateral lower leg pain, redness and swelling. EXAM: CHEST - 2 VIEW COMPARISON:  CT angiogram chest 03/12/2019, chest radiograph 03/12/2019 FINDINGS: Limited evaluation due to patient body habitus and shallow inspiration. Mild cardiomegaly appears unchanged. Mild ill-defined opacity within the lateral right lung base. No evidence of pleural effusion or pneumothorax. No acute bony abnormality. IMPRESSION: Limited examination as described. Shallow inspiration radiograph. Mild ill-defined opacity within the lateral right lung base which may reflect  atelectasis. Pneumonia cannot be excluded. Unchanged mild cardiomegaly. Electronically Signed   By: Kellie Simmering DO   On: 06/13/2019 19:53   DG Tibia/Fibula Left  Result Date: 06/13/2019 CLINICAL DATA:  Bilateral lower leg pain. Redness and swelling. EXAM: LEFT TIBIA AND FIBULA - 2 VIEW COMPARISON:  None. FINDINGS: Cortical margins of the tibia and fibula are intact. No fracture. No bony destruction or periosteal reaction. Moderate osteoarthritis of the knee joint. Mild osteoarthritis of the ankle. Generalized soft tissue edema and soft tissue prominence. No soft tissue air or radiopaque foreign body. Multiple phleboliths in the soft tissues. IMPRESSION: Generalized soft tissue edema.  No acute osseous abnormality. Electronically Signed   By: Keith Rake M.D.   On: 06/13/2019 19:51   DG Tibia/Fibula Right  Result Date: 06/13/2019 CLINICAL DATA:  Bilateral lower leg pain. Redness and swelling. EXAM: RIGHT TIBIA AND FIBULA - 2 VIEW COMPARISON:  None. FINDINGS: Cortical margins of the tibia and fibula are intact. No fracture. No bony destruction or periosteal reaction. Moderate osteoarthritis of the knee joint. Mild osteoarthritis of the ankle. Generalized soft tissue edema and soft tissue prominence. No soft tissue air or radiopaque foreign body. Multiple phleboliths in the soft tissues. IMPRESSION: Generalized soft tissue edema. No acute osseous abnormality. Electronically Signed   By: Keith Rake M.D.   On: 06/13/2019 19:50   DG Abd 1 View  Result Date: 06/16/2019 CLINICAL DATA:  Abdominal pain with nausea and headaches. EXAM: ABDOMEN - 1 VIEW COMPARISON:  Abdominal radiographs CT abdomen/pelvis 11/28/2011. 12/27/2011 FINDINGS: Air distention of a nondescript bowel loop within the right hemiabdomen. Large colonic stool burden within the right colon and distal colon/rectum. No acute bony abnormality. Thoracic spondylosis. IMPRESSION: Air distention of nondescript bowel loop within the right  hemiabdomen, nonspecific. Large stool burden within the right colon and distal colon/rectum. Electronically Signed   By: Kellie Simmering DO   On: 06/16/2019 09:08   CT Angio Chest PE W and/or Wo Contrast  Result Date: 06/14/2019 CLINICAL DATA:  Right leg pain and redness with swelling for 1 week EXAM: CT ANGIOGRAPHY CHEST WITH CONTRAST TECHNIQUE: Multidetector CT imaging of the chest was performed using the standard protocol during bolus administration of intravenous contrast. Multiplanar CT image reconstructions and MIPs were obtained to evaluate the vascular anatomy. CONTRAST:  64mL OMNIPAQUE IOHEXOL 350 MG/ML SOLN COMPARISON:  03/12/2019 FINDINGS: Cardiovascular: Limited opacification of the pulmonary arteries due to body habitus and bolus dispersion. No pulmonary embolism is seen, definitely nondiagnostic beyond the main and lobar level. Coronary calcification. No cardiomegaly or pericardial effusion. Mild atherosclerotic calcification. Mediastinum/Nodes: Negative for adenopathy or mass. Lungs/Pleura: Linear atelectasis/scarring. There is  no edema, consolidation, effusion, or pneumothorax. Symmetric narrowing of the bilateral mainstem bronchi, likely bronchomalacia. Upper Abdomen: No acute finding Musculoskeletal: Spondylosis.  No acute or aggressive finding Review of the MIP images confirms the above findings. IMPRESSION: 1. Severely limited chest CTA due to body habitus and bolus dispersion. No pulmonary embolism is seen at the main or lobar levels. 2.  Aortic Atherosclerosis (ICD10-I70.0).  Coronary atherosclerosis Electronically Signed   By: Monte Fantasia M.D.   On: 06/14/2019 04:08   DG CHEST PORT 1 VIEW  Result Date: 06/21/2019 CLINICAL DATA:  Shortness of breath EXAM: PORTABLE CHEST 1 VIEW COMPARISON:  June 20, 2019 FINDINGS: Mild bibasilar atelectasis. No edema or consolidation. There is cardiomegaly with a degree of pulmonary vascular congestion. IMPRESSION: No active disease. Electronically  Signed   By: Lowella Grip III M.D.   On: 06/21/2019 08:57   DG CHEST PORT 1 VIEW  Result Date: 06/20/2019 CLINICAL DATA:  Shortness of breath EXAM: PORTABLE CHEST 1 VIEW COMPARISON:  Yesterday FINDINGS: Cardiomegaly and vascular pedicle widening accentuated by rotation. Low volume chest with diffuse vascular congestion. Interstitial prominence without Kerley lines. No definite effusion. IMPRESSION: CHF pattern.  Vascular congestion appears increased from yesterday. Electronically Signed   By: Monte Fantasia M.D.   On: 06/20/2019 07:14   DG CHEST PORT 1 VIEW  Result Date: 06/19/2019 CLINICAL DATA:  Shortness of breath EXAM: PORTABLE CHEST 1 VIEW COMPARISON:  06/18/2019 FINDINGS: Bilateral interstitial prominence. Lingular airspace disease which may reflect atelectasis versus pneumonia. No pleural effusion or pneumothorax. Stable cardiomediastinal silhouette. No aggressive osseous lesion. IMPRESSION: Lingular airspace disease which may reflect atelectasis versus pneumonia. Cardiomegaly with pulmonary vascular congestion. Electronically Signed   By: Kathreen Devoid   On: 06/19/2019 07:58   DG CHEST PORT 1 VIEW  Result Date: 06/18/2019 CLINICAL DATA:  Shortness of breath. Cellulitis of lower extremity EXAM: PORTABLE CHEST 1 VIEW COMPARISON:  June 13, 2019 FINDINGS: The mediastinal contour is stable. Heart size is enlarged. Increased pulmonary interstitium is identified bilaterally. No focal pneumonia or pleural effusion is identified. The bony structures are stable. IMPRESSION: Congestive heart failure. Electronically Signed   By: Abelardo Diesel M.D.   On: 06/18/2019 11:25   EEG adult  Result Date: 06/17/2019 Lora Havens, MD     06/17/2019  5:12 PM Patient Name: SALEEN PEDEN MRN: [id number] Epilepsy Attending: Lora Havens Referring Physician/Provider: Dr. Osborne Oman Date: 06/17/2019 Duration: 21.55 mins Patient history: 54 year old female with jerking movements in arms.  EEG to evaluate  for seizures. Level of alertness: Awake and asleep AEDs during EEG study: None Technical aspects: This EEG study was done with scalp electrodes positioned according to the 10-20 International system of electrode placement. Electrical activity was acquired at a sampling rate of 500Hz  and reviewed with a high frequency filter of 70Hz  and a low frequency filter of 1Hz . EEG data were recorded continuously and digitally stored. Description: The posterior dominant rhythm consists of 8 Hz activity of moderate voltage (25-35 uV) seen predominantly in posterior head regions, symmetric and reactive to eye opening and eye closing. Sleep was characterized by generalized 4-6hz  theta-delta slowing. Hyperventilation and photic stimulation were not performed. IMPRESSION: This study is within normal limits. No seizures or epileptiform discharges were seen throughout the recording. Lora Havens   ECHOCARDIOGRAM COMPLETE  Result Date: 06/20/2019    ECHOCARDIOGRAM REPORT   Patient Name:   ARNECIA ECTOR Date of Exam: 06/20/2019 Medical Rec #:  [id number]  Height:       66.0 in Accession #:    [id number]        Weight:       395.1 lb Date of Birth:  [birth year]          BSA:          2.671 m Patient Age:    25 years          BP:           114/71 mmHg Patient Gender: F                 HR:           76 bpm. Exam Location:  Inpatient Procedure: 2D Echo, Color Doppler and Cardiac Doppler Indications:    R06.9 DOE  History:        Patient has prior history of Echocardiogram examinations, most                 recent 07/09/2017. CHF; Risk Factors:Sleep Apnea, Hypertension                 and Dyslipidemia.  Sonographer:    Raquel Sarna Senior RDCS Referring Phys: [id number] Georgina Quint LATIF [facility]  Sonographer Comments: Technically difficult study with very poor image quality due to morbid obesity and patient being unable to tolerate probe pressure. IMPRESSIONS  1. Technically difficult study with limited views. Grossly normal LV systolic  function. Left ventricular ejection fraction, by estimation, is 60 to 65%. Left ventricular diastolic parameters are indeterminate.  2. Right ventricular systolic function was not well visualized. The right ventricular size is not well visualized.  3. The mitral valve was not well visualized. No evidence of mitral valve regurgitation.  4. The aortic valve was not well visualized. Aortic valve regurgitation is not visualized. No aortic stenosis is present.  5. The inferior vena cava is dilated in size with >50% respiratory variability, suggesting right atrial pressure of 8 mmHg. FINDINGS  Left Ventricle: Left ventricular ejection fraction, by estimation, is 60 to 65%. The left ventricle has normal function. The left ventricle has no regional wall motion abnormalities. The left ventricular internal cavity size was normal in size. There is  Not well visualized left ventricular hypertrophy. Left ventricular diastolic parameters are indeterminate. Right Ventricle: The right ventricular size is not well visualized. Right vetricular wall thickness was not assessed. Right ventricular systolic function was not well visualized. Left Atrium: Left atrial size was not well visualized. Right Atrium: Right atrial size was not well visualized. Pericardium: Trivial pericardial effusion is present. Presence of pericardial fat pad. Mitral Valve: The mitral valve was not well visualized. No evidence of mitral valve regurgitation. Tricuspid Valve: The tricuspid valve is not well visualized. Tricuspid valve regurgitation is not demonstrated. Aortic Valve: The aortic valve was not well visualized. Aortic valve regurgitation is not visualized. No aortic stenosis is present. Pulmonic Valve: The pulmonic valve was not well visualized. Pulmonic valve regurgitation is not visualized. Aorta: The aortic root was not well visualized. Venous: The inferior vena cava is dilated in size with greater than 50% respiratory variability, suggesting right  atrial pressure of 8 mmHg. IAS/Shunts: The interatrial septum was not well visualized.  AORTIC VALVE LVOT Vmax:   116.00 cm/s LVOT Vmean:  63.900 cm/s LVOT VTI:    0.209 m  SHUNTS Systemic VTI: 0.21 m Oswaldo Milian MD Electronically signed by Oswaldo Milian MD Signature Date/Time: 06/20/2019/2:30:30 PM    Final    VAS [place] LOWER EXTREMITY VENOUS (  DVT)  Result Date: 06/14/2019  Lower Venous DVTStudy Indications: Edema.  Limitations: Body habitus and poor ultrasound/tissue interface. Comparison Study: 07/07/17 previous Performing Technologist: Abram Sander RVS  Examination Guidelines: A complete evaluation includes B-mode imaging, spectral Doppler, color Doppler, and power Doppler as needed of all accessible portions of each vessel. Bilateral testing is considered an integral part of a complete examination. Limited examinations for reoccurring indications may be performed as noted. The reflux portion of the exam is performed with the patient in reverse Trendelenburg.  +---------+---------------+---------+-----------+----------+--------------+ RIGHT    CompressibilityPhasicitySpontaneityPropertiesThrombus Aging +---------+---------------+---------+-----------+----------+--------------+ CFV      Full           Yes      Yes                                 +---------+---------------+---------+-----------+----------+--------------+ SFJ      Full                                                        +---------+---------------+---------+-----------+----------+--------------+ FV Prox  Full                                                        +---------+---------------+---------+-----------+----------+--------------+ FV Mid   Full                                                        +---------+---------------+---------+-----------+----------+--------------+ FV DistalFull                                                         +---------+---------------+---------+-----------+----------+--------------+ PFV      Full                                                        +---------+---------------+---------+-----------+----------+--------------+ POP      Full           Yes      Yes                                 +---------+---------------+---------+-----------+----------+--------------+ PTV                                                   Not visualized +---------+---------------+---------+-----------+----------+--------------+ PERO  Not visualized +---------+---------------+---------+-----------+----------+--------------+   +---------+---------------+---------+-----------+----------+--------------+ LEFT     CompressibilityPhasicitySpontaneityPropertiesThrombus Aging +---------+---------------+---------+-----------+----------+--------------+ CFV      Full           Yes      Yes                                 +---------+---------------+---------+-----------+----------+--------------+ SFJ      Full                                                        +---------+---------------+---------+-----------+----------+--------------+ FV Prox  Full                                                        +---------+---------------+---------+-----------+----------+--------------+ FV Mid   Full                                                        +---------+---------------+---------+-----------+----------+--------------+ FV DistalFull                                                        +---------+---------------+---------+-----------+----------+--------------+ PFV      Full                                                        +---------+---------------+---------+-----------+----------+--------------+ POP      Full           Yes      Yes                                  +---------+---------------+---------+-----------+----------+--------------+ PTV      Full                                                        +---------+---------------+---------+-----------+----------+--------------+ PERO                                                  Not visualized +---------+---------------+---------+-----------+----------+--------------+     Summary: BILATERAL: - No evidence of deep vein thrombosis seen in the lower extremities, bilaterally.   *See table(s) above for measurements and observations. Electronically signed by Deitra Mayo MD on 06/14/2019 at 1:09:05 PM.    Final      Time Spent  in minutes  30     Desiree Hane M.D on 06/23/2019 at 4:55 PM  To page go to [email or web address] - password [facility]

## 2019-06-23 NOTE — Progress Notes (Signed)
Patient took off and will call when she is ready or wants to put the Bipap on.

## 2019-06-23 NOTE — Progress Notes (Signed)
   NAME:  Kara Vincent, MRN:  [id number], DOB:  [birth year], LOS: 9 ADMISSION DATE:  06/13/2019, CONSULTATION DATE:  06/18/2019 REFERRING MD:  Alfredia Ferguson CHIEF COMPLAINT:  SOB   Brief History   54 year old woman with hx of morbid obesity, chronic pain/anxiety presenting with acute on chronic hypercarbia.  History of present illness   54 year old woman nonsmoker with hx of morbid obesity, chronic pain/anxiety presenting with acute on chronic hypercarbia.  Has a presumed diagnosis of lower ext cellulitis. She does not like wearing BIPAP but eventually was coerced into wearing it a few hours this AM.  ABG shows worsening retention.  PCCM consulted for further recs.  Patient is anxious and requesting meds by name for this.  Past Medical History  OSA w/o CPAP, chronic O2 need, OHS, homeless, "asthma", hidranitis, anxiety, chronic pain  Significant Hospital Events   3/23 admitted  Consults:  PCCM Procedures:  None  Significant Diagnostic Tests:  CTA chest- obese LE doppler neg  Micro Data:  COVID neg HIV neg MRSA swab neg  Antimicrobials:  Ceftriaxone 3/22-3/29 Cephalexin 3/30>> Interim history/subjective:  Pruritus  Somnolent  Objective   Blood pressure 116/66, pulse 94, temperature 98.2 F (36.8 C), temperature source Axillary, resp. rate 19, height 5\' 6"  (1.676 m), weight (!) 179.2 kg, last menstrual period 03/24/2009, SpO2 97 %.        Intake/Output Summary (Last 24 hours) at 06/23/2019 1211 Last data filed at 06/23/2019 0300 Gross per 24 hour  Intake -  Output 850 ml  Net -850 ml   Filed Weights   06/14/19 1400 06/16/19 2129  Weight: (!) 182.8 kg (!) 179.2 kg    Examination: General: Morbidly obese, somnolent, able to hold conversations easily HENT: Mallampati 4 Lungs: Clear breath sounds Cardiovascular: S1-S2 appreciated Abdomen: Soft, bowel sounds appreciated  Resolved Hospital Problem list     Assessment & Plan:  Acute on chronic hypercarbic respiratory  failure Obesity hypoventilation syndrome Obstructive sleep apnea Noncompliance with BiPAP -Continue oxygen supplementation -Cautious with sedating medications  Trying to tolerate BiPAP -Encouraged use BiPAP as tolerated -Will require noninvasive ventilator for long-term management of obesity hypoventilation  Cellulitis management per primary -Currently on oral antibiotics  Extensive skin rash/itching  Morbid obesity Continue other lines of care  Sherrilyn Rist, MD [place] [facility] Pager: [phone]

## 2019-06-23 NOTE — TOC Progression Note (Signed)
Transition of Care [facility]) - Progression Note    Patient Details  Name: Kara Vincent MRN: [id number] Date of Birth: [birth year]  Transition of Care [facility]) CM/SW [place], RN Phone Number: 06/23/2019, 4:08 PM  Clinical Narrative:    Unable to obtain referral for home Bipap. Patient will need outpatient sleep study. MD has been updated.   Expected Discharge Plan: Home/Self Care(friend to provide 24 hour care) Barriers to Discharge: Continued Medical Work up  Expected Discharge Plan and Services Expected Discharge Plan: Home/Self Care(friend to provide 24 hour care)   Discharge Planning Services: CM Consult   Living arrangements for the past 2 months: Homeless(living in hotel with friend)                   DME Agency: [employer](previously on O2 prior to admit)                   Social Determinants of Health (SDOH) Interventions    Readmission Risk Interventions No flowsheet data found.

## 2019-06-24 LAB — CBC
HCT: 37 % (ref 36.0–46.0)
Hemoglobin: 10.6 g/dL — ABNORMAL LOW (ref 12.0–15.0)
MCH: 29.3 pg (ref 26.0–34.0)
MCHC: 28.6 g/dL — ABNORMAL LOW (ref 30.0–36.0)
MCV: 102.2 fL — ABNORMAL HIGH (ref 80.0–100.0)
Platelets: 326 10*3/uL (ref 150–400)
RBC: 3.62 MIL/uL — ABNORMAL LOW (ref 3.87–5.11)
RDW: 14.6 % (ref 11.5–15.5)
WBC: 9.8 10*3/uL (ref 4.0–10.5)
nRBC: 0 % (ref 0.0–0.2)

## 2019-06-24 LAB — BASIC METABOLIC PANEL
Anion gap: 11 (ref 5–15)
BUN: 26 mg/dL — ABNORMAL HIGH (ref 6–20)
CO2: 34 mmol/L — ABNORMAL HIGH (ref 22–32)
Calcium: 9 mg/dL (ref 8.9–10.3)
Chloride: 94 mmol/L — ABNORMAL LOW (ref 98–111)
Creatinine, Ser: 1.38 mg/dL — ABNORMAL HIGH (ref 0.44–1.00)
GFR calc Af Amer: 50 mL/min — ABNORMAL LOW (ref >60)
GFR calc non Af Amer: 43 mL/min — ABNORMAL LOW (ref >60)
Glucose, Bld: 141 mg/dL — ABNORMAL HIGH (ref 70–99)
Potassium: 5 mmol/L (ref 3.5–5.1)
Sodium: 139 mmol/L (ref 135–145)

## 2019-06-24 MED ORDER — ENOXAPARIN SODIUM 100 MG/ML ~~LOC~~ SOLN
90.0000 mg | SUBCUTANEOUS | Status: DC
  Administered 2019-06-24 – 2019-06-29 (×6): 90 mg via SUBCUTANEOUS
  Filled 2019-06-24 (×6): qty 1

## 2019-06-24 MED ORDER — FUROSEMIDE 40 MG PO TABS
40.0000 mg | ORAL_TABLET | Freq: Every day | ORAL | Status: DC
  Administered 2019-06-24 – 2019-06-25 (×2): 40 mg via ORAL
  Filled 2019-06-24 (×2): qty 1

## 2019-06-24 NOTE — Progress Notes (Signed)
[facility]  PROGRESS NOTE  Kara Vincent WFU:[id number] DOB: 1966-03-04 DOA: 06/13/2019 PCP: Ladell Pier, MD Admit date - 06/13/2019   Admitting Physician [name], MD  Outpatient Primary MD for the patient is Ladell Pier, MD  LOS - 10 Brief Narrative   Kara Vincent is a 54 y.o. year old female with medical history significant for morbid obesity, chronic pain/anxiety, chronic hypoxic/hypercarbic respiratory failure on 2 L nasal cannula, obesity hypoventilation syndrome who presented on 06/13/2019 with 1 week of bilateral leg pain and swelling, and shortness of breath and was admitted with working diagnosis of lower extremity cellulitis and panniculitis.  Hospital course : Chest x-ray with concern for atelectasis versus pneumonia, given shortness of breath CTA was obtained which was negative for any PE and Dopplers negative for DVT.  Course complicated by worsening CO2 retention resulting in decreased mental status and worsening respiratory function.  With initiation of NIPPV (patient had episodes of adherence) patient clinically improved.  PCCM recommends continuing BiPAP for long-term use as outpatient in setting of OSA/OHS and chronic O2 needs.  Arrangement of DME complicated by patient's home disposition.  Subjective  Today nursing reports no issues with NIPPV overnight  A & P   Acute on chronic hypoxic and hypercarbic respiratory failure, improving.  At baseline patient is on 3L, currently on 4 L with SPO2 97%.  Likely multifactorial related to OSA, obesity hypoventilation syndrome/asthma. Patient  responding well to NIPPV in hospital.  Still having productive cough per her report, lungs are clear on auscultation -Continue incentive spirometry, flutter valve -Patient greatly benefits from BiPAP, PCCM recommends continue long-term management -Unable to obtain referral for home BiPAP per TOC due to home disposition (bedbugs per agency), will need  outpatient sleep study, however per PCCM needs long term to prevent CO2 retention so social worker arranging -Pulmicort, Brovana  Bilateral lower extremity cellulitis/panniculitis,resolved. Completed 10 day course (ceftriaxone 3/22-3/29, Keflex 3/30-4/1) -Remains afebrile, rash improving   Myoclonic jerking, resolved.  In the setting of hypercarbia.  Neurology evaluated and agree with most likely related to elevated CO2 due to above. -Continue to monitor, treating hypercarbia with NIPPV  CHF with preserved EF TTE on 2025 showed diastolic dysfunction.  Repeat TTE here shows preserved EF.  Lasix for vascular congestion with hypoxia earlier in hospital stay -Daily weights, strict I's and O's -Resume home oral Lasix  Hyperkalemia, resolved -Have been holding home lisinopril -Monitor BMP  HTN, stable -Continue clonidine, Imdur,   CKD stage III, stable -Avoid nephrotoxins, continue to closely monitor on home Lasix and lisinopril  Anemia of chronic kidney disease, stable -Monitor hemoglobin on CBC  Bedbugs -contact precautions  History of gout, stable -continue home allopurinol  History of sleep apnea has not been adherent as patient lost CPAP, continue CPAP at bedtime  Morbid obesity BMI 63.77 -Weight loss/dietary counseling provided during hospital stay.  Chronic pain -Tylenol as needed every 8 hours, Percocet 2 tabs as needed every 6 hours, closely monitor mental status, avoid narcotics given somnolence -Emphasized no outpatient prescriptions will be provided, will need to discuss with primary care physician -Cymbalta -continue bowel regimen with MiraLAX twice daily and senna docusate   Family Communication  : Spouse updated at bedside on 4/1  Code Status : Full  Disposition Plan  :  Patient is from home. Anticipated d/c date:  24 to 48 hours. Barriers to d/c or necessity for inpatient status:  Able to have stable mental status with adherence to NIPPV, safe  arrangement  of BiPAP on discharge, per case management) and O2. Consults  : CCM  Procedures  : 06/20/19, TTE.  EF 60-65%: 3/23 bilateral venous duplex DVTs  DVT Prophylaxis  : Heparin  Lab Results  Component Value Date   PLT 326 06/24/2019    Diet :  Diet Order            Diet regular Room service appropriate? Yes; Fluid consistency: Thin  Diet effective now               Inpatient Medications Scheduled Meds: . allopurinol  100 mg Oral Daily  . arformoterol  15 mcg Nebulization BID  . budesonide (PULMICORT) nebulizer solution  0.25 mg Nebulization BID  . carbamide peroxide  5 drop Both EARS BID  . cloNIDine  0.2 mg Oral BID  . darifenacin  7.5 mg Oral Daily  . DULoxetine  30 mg Oral Daily  . heparin injection (subcutaneous)  5,000 Units Subcutaneous Q8H  . isosorbide mononitrate  30 mg Oral Daily  . mouth rinse  15 mL Mouth Rinse BID  . omega-3 acid ethyl esters  1 g Oral Daily  . pantoprazole  40 mg Oral Daily  . polyethylene glycol  17 g Oral BID  . senna-docusate  1 tablet Oral BID  . triamcinolone cream   Topical BID  . vitamin E  100 Units Oral Daily   Continuous Infusions: PRN Meds:.acetaminophen **OR** acetaminophen, acetaminophen-codeine, albuterol, ALPRAZolam, bisacodyl, guaiFENesin-dextromethorphan, hydrOXYzine, ondansetron (ZOFRAN) IV, oxyCODONE-acetaminophen  Antibiotics  :   Anti-infectives (From admission, onward)   Start     Dose/Rate Route Frequency Ordered Stop   06/21/19 1800  cephALEXin (KEFLEX) capsule 500 mg     500 mg Oral Every 6 hours 06/21/19 1236 06/23/19 1708   06/14/19 2000  cefTRIAXone (ROCEPHIN) 1 g in sodium chloride 0.9 % 100 mL IVPB  Status:  Discontinued     1 g 200 mL/hr over 30 Minutes Intravenous Every 24 hours 06/14/19 0756 06/21/19 1236   06/14/19 0430  vancomycin (VANCOREADY) IVPB 2000 mg/400 mL     2,000 mg 200 mL/hr over 120 Minutes Intravenous  Once 06/14/19 0430 06/14/19 0700   06/14/19 0400  cefTRIAXone (ROCEPHIN) 1 g in sodium  chloride 0.9 % 100 mL IVPB     1 g 200 mL/hr over 30 Minutes Intravenous  Once 06/14/19 0346 06/14/19 0450       Objective   Vitals:   06/23/19 2300 06/24/19 0029 06/24/19 0500 06/24/19 0808  BP:   108/67   Pulse: 73  69   Resp: 15     Temp:  98.2 F (36.8 C) 97.8 F (36.6 C)   TempSrc:  Oral Oral   SpO2: 96%   100%  Weight:      Height:        SpO2: 100 % O2 Flow Rate (L/min): 3 L/min FiO2 (%): 30 %  Wt Readings from Last 3 Encounters:  06/16/19 (!) 179.2 kg  04/14/19 (!) 176.9 kg  12/17/18 (!) 177.8 kg     Intake/Output Summary (Last 24 hours) at 06/24/2019 1117 Last data filed at 06/23/2019 2300 Gross per 24 hour  Intake --  Output 400 ml  Net -400 ml    Physical Exam:     Sleepy, but arousable, Oriented X 3, Normal affect No new F.N deficits,  [place].AT, Normal respiratory effort on 3-4 L, decreased breath sounds at bases, CTAB RRR,No Gallops,Rubs or new Murmurs,  Obese abdomen, +ve B.Sounds, Abd Soft,  No tenderness, No rebound, guarding or rigidity. No edema, no overt redness, no tenderness Scattered abrasions   I have personally reviewed the following:   Data Reviewed:  CBC Recent Labs  Lab 06/18/19 0353 06/18/19 0353 06/19/19 0207 06/20/19 0253 06/21/19 0252 06/22/19 0215 06/24/19 0815  WBC 9.9   < > 9.2 9.7 9.6 8.9 9.8  HGB 9.8*   < > 9.7* 9.9* 10.2* 10.1* 10.6*  HCT 34.7*   < > 34.2* 35.0* 36.0 35.8* 37.0  PLT 323   < > 309 324 322 317 326  MCV 103.0*   < > 102.1* 101.7* 102.0* 101.1* 102.2*  MCH 29.1   < > 29.0 28.8 28.9 28.5 29.3  MCHC 28.2*   < > 28.4* 28.3* 28.3* 28.2* 28.6*  RDW 14.4   < > 14.0 14.1 14.3 14.5 14.6  LYMPHSABS 1.6  --  1.5 2.0 1.9 2.0  --   MONOABS 0.6  --  0.4 0.4 0.4 0.4  --   EOSABS 0.3  --  0.3 0.3 0.3 0.3  --   BASOSABS 0.1  --  0.0 0.1 0.0 0.0  --    < > = values in this interval not displayed.    Chemistries  Recent Labs  Lab 06/18/19 0353 06/18/19 0353 06/19/19 0207 06/20/19 0253 06/21/19 0252  06/22/19 0215 06/24/19 0815  NA 142   < > 141 143 143 139 139  K 5.2*   < > 5.3* 5.1 5.0 4.7 5.0  CL 97*   < > 95* 95* 94* 92* 94*  CO2 37*   < > 40* 38* 39* 36* 34*  GLUCOSE 96   < > 113* 118* 115* 113* 141*  BUN 30*   < > 27* 21* 21* 23* 26*  CREATININE 1.98*   < > 1.32* 1.28* 1.57* 1.30* 1.38*  CALCIUM 8.4*   < > 8.6* 8.6* 8.7* 8.8* 9.0  MG 1.7  --  2.1 1.9 1.8 1.8  --   AST 9*  --  11* 11* 13* 14*  --   ALT 12  --  12 11 13 14   --   ALKPHOS 54  --  53 51 55 56  --   BILITOT 0.5  --  0.5 0.7 0.5 0.4  --    < > = values in this interval not displayed.   ------------------------------------------------------------------------------------------------------------------ No results for input(s): CHOL, HDL, LDLCALC, TRIG, CHOLHDL, LDLDIRECT in the last 72 hours.  Lab Results  Component Value Date   HGBA1C 5.5 12/01/2017   ------------------------------------------------------------------------------------------------------------------ No results for input(s): TSH, T4TOTAL, T3FREE, THYROIDAB in the last 72 hours.  Invalid input(s): FREET3 ------------------------------------------------------------------------------------------------------------------ No results for input(s): VITAMINB12, FOLATE, FERRITIN, TIBC, IRON, RETICCTPCT in the last 72 hours.  Coagulation profile No results for input(s): INR, PROTIME in the last 168 hours.  No results for input(s): DDIMER in the last 72 hours.  Cardiac Enzymes No results for input(s): CKMB, TROPONINI, MYOGLOBIN in the last 168 hours.  Invalid input(s): CK ------------------------------------------------------------------------------------------------------------------    Component Value Date/Time   BNP 120.9 (H) 06/14/2019 0212    Micro Results Recent Results (from the past 240 hour(s))  MRSA PCR Screening     Status: None   Collection Time: 06/14/19  4:25 PM   Specimen: Nasal Mucosa; Nasopharyngeal  Result Value Ref Range Status    MRSA by PCR NEGATIVE NEGATIVE Final    Comment:        The GeneXpert MRSA Assay (FDA approved for NASAL specimens only), is  one component of a comprehensive MRSA colonization surveillance program. It is not intended to diagnose MRSA infection nor to guide or monitor treatment for MRSA infections. Performed at [facility], [place] [address] [place], [place] [address]     Radiology Reports DG Chest 2 View  Result Date: 06/13/2019 CLINICAL DATA:  Shortness of breath. Patient reports bilateral lower leg pain, redness and swelling. EXAM: CHEST - 2 VIEW COMPARISON:  CT angiogram chest 03/12/2019, chest radiograph 03/12/2019 FINDINGS: Limited evaluation due to patient body habitus and shallow inspiration. Mild cardiomegaly appears unchanged. Mild ill-defined opacity within the lateral right lung base. No evidence of pleural effusion or pneumothorax. No acute bony abnormality. IMPRESSION: Limited examination as described. Shallow inspiration radiograph. Mild ill-defined opacity within the lateral right lung base which may reflect atelectasis. Pneumonia cannot be excluded. Unchanged mild cardiomegaly. Electronically Signed   By: Kellie Simmering DO   On: 06/13/2019 19:53   DG Tibia/Fibula Left  Result Date: 06/13/2019 CLINICAL DATA:  Bilateral lower leg pain. Redness and swelling. EXAM: LEFT TIBIA AND FIBULA - 2 VIEW COMPARISON:  None. FINDINGS: Cortical margins of the tibia and fibula are intact. No fracture. No bony destruction or periosteal reaction. Moderate osteoarthritis of the knee joint. Mild osteoarthritis of the ankle. Generalized soft tissue edema and soft tissue prominence. No soft tissue air or radiopaque foreign body. Multiple phleboliths in the soft tissues. IMPRESSION: Generalized soft tissue edema.  No acute osseous abnormality. Electronically Signed   By: Keith Rake M.D.   On: 06/13/2019 19:51   DG Tibia/Fibula Right  Result Date: 06/13/2019 CLINICAL DATA:  Bilateral  lower leg pain. Redness and swelling. EXAM: RIGHT TIBIA AND FIBULA - 2 VIEW COMPARISON:  None. FINDINGS: Cortical margins of the tibia and fibula are intact. No fracture. No bony destruction or periosteal reaction. Moderate osteoarthritis of the knee joint. Mild osteoarthritis of the ankle. Generalized soft tissue edema and soft tissue prominence. No soft tissue air or radiopaque foreign body. Multiple phleboliths in the soft tissues. IMPRESSION: Generalized soft tissue edema. No acute osseous abnormality. Electronically Signed   By: Keith Rake M.D.   On: 06/13/2019 19:50   DG Abd 1 View  Result Date: 06/16/2019 CLINICAL DATA:  Abdominal pain with nausea and headaches. EXAM: ABDOMEN - 1 VIEW COMPARISON:  Abdominal radiographs CT abdomen/pelvis 11/28/2011. 12/27/2011 FINDINGS: Air distention of a nondescript bowel loop within the right hemiabdomen. Large colonic stool burden within the right colon and distal colon/rectum. No acute bony abnormality. Thoracic spondylosis. IMPRESSION: Air distention of nondescript bowel loop within the right hemiabdomen, nonspecific. Large stool burden within the right colon and distal colon/rectum. Electronically Signed   By: Kellie Simmering DO   On: 06/16/2019 09:08   CT Angio Chest PE W and/or Wo Contrast  Result Date: 06/14/2019 CLINICAL DATA:  Right leg pain and redness with swelling for 1 week EXAM: CT ANGIOGRAPHY CHEST WITH CONTRAST TECHNIQUE: Multidetector CT imaging of the chest was performed using the standard protocol during bolus administration of intravenous contrast. Multiplanar CT image reconstructions and MIPs were obtained to evaluate the vascular anatomy. CONTRAST:  22mL OMNIPAQUE IOHEXOL 350 MG/ML SOLN COMPARISON:  03/12/2019 FINDINGS: Cardiovascular: Limited opacification of the pulmonary arteries due to body habitus and bolus dispersion. No pulmonary embolism is seen, definitely nondiagnostic beyond the main and lobar level. Coronary calcification. No  cardiomegaly or pericardial effusion. Mild atherosclerotic calcification. Mediastinum/Nodes: Negative for adenopathy or mass. Lungs/Pleura: Linear atelectasis/scarring. There is no edema, consolidation, effusion, or pneumothorax. Symmetric narrowing  of the bilateral mainstem bronchi, likely bronchomalacia. Upper Abdomen: No acute finding Musculoskeletal: Spondylosis.  No acute or aggressive finding Review of the MIP images confirms the above findings. IMPRESSION: 1. Severely limited chest CTA due to body habitus and bolus dispersion. No pulmonary embolism is seen at the main or lobar levels. 2.  Aortic Atherosclerosis (ICD10-I70.0).  Coronary atherosclerosis Electronically Signed   By: Monte Fantasia M.D.   On: 06/14/2019 04:08   DG CHEST PORT 1 VIEW  Result Date: 06/21/2019 CLINICAL DATA:  Shortness of breath EXAM: PORTABLE CHEST 1 VIEW COMPARISON:  June 20, 2019 FINDINGS: Mild bibasilar atelectasis. No edema or consolidation. There is cardiomegaly with a degree of pulmonary vascular congestion. IMPRESSION: No active disease. Electronically Signed   By: Lowella Grip III M.D.   On: 06/21/2019 08:57   DG CHEST PORT 1 VIEW  Result Date: 06/20/2019 CLINICAL DATA:  Shortness of breath EXAM: PORTABLE CHEST 1 VIEW COMPARISON:  Yesterday FINDINGS: Cardiomegaly and vascular pedicle widening accentuated by rotation. Low volume chest with diffuse vascular congestion. Interstitial prominence without Kerley lines. No definite effusion. IMPRESSION: CHF pattern.  Vascular congestion appears increased from yesterday. Electronically Signed   By: Monte Fantasia M.D.   On: 06/20/2019 07:14   DG CHEST PORT 1 VIEW  Result Date: 06/19/2019 CLINICAL DATA:  Shortness of breath EXAM: PORTABLE CHEST 1 VIEW COMPARISON:  06/18/2019 FINDINGS: Bilateral interstitial prominence. Lingular airspace disease which may reflect atelectasis versus pneumonia. No pleural effusion or pneumothorax. Stable cardiomediastinal silhouette.  No aggressive osseous lesion. IMPRESSION: Lingular airspace disease which may reflect atelectasis versus pneumonia. Cardiomegaly with pulmonary vascular congestion. Electronically Signed   By: Kathreen Devoid   On: 06/19/2019 07:58   DG CHEST PORT 1 VIEW  Result Date: 06/18/2019 CLINICAL DATA:  Shortness of breath. Cellulitis of lower extremity EXAM: PORTABLE CHEST 1 VIEW COMPARISON:  June 13, 2019 FINDINGS: The mediastinal contour is stable. Heart size is enlarged. Increased pulmonary interstitium is identified bilaterally. No focal pneumonia or pleural effusion is identified. The bony structures are stable. IMPRESSION: Congestive heart failure. Electronically Signed   By: Abelardo Diesel M.D.   On: 06/18/2019 11:25   EEG adult  Result Date: 06/17/2019 Lora Havens, MD     06/17/2019  5:12 PM Patient Name: Kara Vincent MRN: [id number] Epilepsy Attending: Lora Havens Referring Physician/Provider: Dr. Osborne Oman Date: 06/17/2019 Duration: 21.55 mins Patient history: 54 year old female with jerking movements in arms.  EEG to evaluate for seizures. Level of alertness: Awake and asleep AEDs during EEG study: None Technical aspects: This EEG study was done with scalp electrodes positioned according to the 10-20 International system of electrode placement. Electrical activity was acquired at a sampling rate of 500Hz  and reviewed with a high frequency filter of 70Hz  and a low frequency filter of 1Hz . EEG data were recorded continuously and digitally stored. Description: The posterior dominant rhythm consists of 8 Hz activity of moderate voltage (25-35 uV) seen predominantly in posterior head regions, symmetric and reactive to eye opening and eye closing. Sleep was characterized by generalized 4-6hz  theta-delta slowing. Hyperventilation and photic stimulation were not performed. IMPRESSION: This study is within normal limits. No seizures or epileptiform discharges were seen throughout the recording.  Lora Havens   ECHOCARDIOGRAM COMPLETE  Result Date: 06/20/2019    ECHOCARDIOGRAM REPORT   Patient Name:   Kara Vincent Date of Exam: 06/20/2019 Medical Rec #:  [id number]         Height:  66.0 in Accession #:    [id number]        Weight:       395.1 lb Date of Birth:  1965/06/25          BSA:          2.671 m Patient Age:    61 years          BP:           114/71 mmHg Patient Gender: F                 HR:           76 bpm. Exam Location:  Inpatient Procedure: 2D Echo, Color Doppler and Cardiac Doppler Indications:    R06.9 DOE  History:        Patient has prior history of Echocardiogram examinations, most                 recent 07/09/2017. CHF; Risk Factors:Sleep Apnea, Hypertension                 and Dyslipidemia.  Sonographer:    Raquel Sarna Senior RDCS Referring Phys: [id number] Georgina Quint LATIF [facility]  Sonographer Comments: Technically difficult study with very poor image quality due to morbid obesity and patient being unable to tolerate probe pressure. IMPRESSIONS  1. Technically difficult study with limited views. Grossly normal LV systolic function. Left ventricular ejection fraction, by estimation, is 60 to 65%. Left ventricular diastolic parameters are indeterminate.  2. Right ventricular systolic function was not well visualized. The right ventricular size is not well visualized.  3. The mitral valve was not well visualized. No evidence of mitral valve regurgitation.  4. The aortic valve was not well visualized. Aortic valve regurgitation is not visualized. No aortic stenosis is present.  5. The inferior vena cava is dilated in size with >50% respiratory variability, suggesting right atrial pressure of 8 mmHg. FINDINGS  Left Ventricle: Left ventricular ejection fraction, by estimation, is 60 to 65%. The left ventricle has normal function. The left ventricle has no regional wall motion abnormalities. The left ventricular internal cavity size was normal in size. There is  Not well visualized left  ventricular hypertrophy. Left ventricular diastolic parameters are indeterminate. Right Ventricle: The right ventricular size is not well visualized. Right vetricular wall thickness was not assessed. Right ventricular systolic function was not well visualized. Left Atrium: Left atrial size was not well visualized. Right Atrium: Right atrial size was not well visualized. Pericardium: Trivial pericardial effusion is present. Presence of pericardial fat pad. Mitral Valve: The mitral valve was not well visualized. No evidence of mitral valve regurgitation. Tricuspid Valve: The tricuspid valve is not well visualized. Tricuspid valve regurgitation is not demonstrated. Aortic Valve: The aortic valve was not well visualized. Aortic valve regurgitation is not visualized. No aortic stenosis is present. Pulmonic Valve: The pulmonic valve was not well visualized. Pulmonic valve regurgitation is not visualized. Aorta: The aortic root was not well visualized. Venous: The inferior vena cava is dilated in size with greater than 50% respiratory variability, suggesting right atrial pressure of 8 mmHg. IAS/Shunts: The interatrial septum was not well visualized.  AORTIC VALVE LVOT Vmax:   116.00 cm/s LVOT Vmean:  63.900 cm/s LVOT VTI:    0.209 m  SHUNTS Systemic VTI: 0.21 m Oswaldo Milian MD Electronically signed by Oswaldo Milian MD Signature Date/Time: 06/20/2019/2:30:30 PM    Final    VAS [place] LOWER EXTREMITY VENOUS (DVT)  Result Date: 06/14/2019  Lower Venous  DVTStudy Indications: Edema.  Limitations: Body habitus and poor ultrasound/tissue interface. Comparison Study: 07/07/17 previous Performing Technologist: Abram Sander RVS  Examination Guidelines: A complete evaluation includes B-mode imaging, spectral Doppler, color Doppler, and power Doppler as needed of all accessible portions of each vessel. Bilateral testing is considered an integral part of a complete examination. Limited examinations for reoccurring  indications may be performed as noted. The reflux portion of the exam is performed with the patient in reverse Trendelenburg.  +---------+---------------+---------+-----------+----------+--------------+ RIGHT    CompressibilityPhasicitySpontaneityPropertiesThrombus Aging +---------+---------------+---------+-----------+----------+--------------+ CFV      Full           Yes      Yes                                 +---------+---------------+---------+-----------+----------+--------------+ SFJ      Full                                                        +---------+---------------+---------+-----------+----------+--------------+ FV Prox  Full                                                        +---------+---------------+---------+-----------+----------+--------------+ FV Mid   Full                                                        +---------+---------------+---------+-----------+----------+--------------+ FV DistalFull                                                        +---------+---------------+---------+-----------+----------+--------------+ PFV      Full                                                        +---------+---------------+---------+-----------+----------+--------------+ POP      Full           Yes      Yes                                 +---------+---------------+---------+-----------+----------+--------------+ PTV                                                   Not visualized +---------+---------------+---------+-----------+----------+--------------+ PERO  Not visualized +---------+---------------+---------+-----------+----------+--------------+   +---------+---------------+---------+-----------+----------+--------------+ LEFT     CompressibilityPhasicitySpontaneityPropertiesThrombus Aging  +---------+---------------+---------+-----------+----------+--------------+ CFV      Full           Yes      Yes                                 +---------+---------------+---------+-----------+----------+--------------+ SFJ      Full                                                        +---------+---------------+---------+-----------+----------+--------------+ FV Prox  Full                                                        +---------+---------------+---------+-----------+----------+--------------+ FV Mid   Full                                                        +---------+---------------+---------+-----------+----------+--------------+ FV DistalFull                                                        +---------+---------------+---------+-----------+----------+--------------+ PFV      Full                                                        +---------+---------------+---------+-----------+----------+--------------+ POP      Full           Yes      Yes                                 +---------+---------------+---------+-----------+----------+--------------+ PTV      Full                                                        +---------+---------------+---------+-----------+----------+--------------+ PERO                                                  Not visualized +---------+---------------+---------+-----------+----------+--------------+     Summary: BILATERAL: - No evidence of deep vein thrombosis seen in the lower extremities, bilaterally.   *See table(s) above for measurements and observations. Electronically signed by Deitra Mayo MD on 06/14/2019 at 1:09:05 PM.    Final      Time Spent  in minutes  30     Desiree Hane M.D on 06/24/2019 at 11:17 AM  To page go to [email or web address] - password [facility]

## 2019-06-24 NOTE — Progress Notes (Signed)
   NAME:  Kara Vincent, MRN:  [id number], DOB:  1965-04-22, LOS: 104 ADMISSION DATE:  06/13/2019, CONSULTATION DATE:  06/18/2019 REFERRING MD:  Alfredia Ferguson CHIEF COMPLAINT:  SOB   Brief History   54 year old woman with hx of morbid obesity, chronic pain/anxiety presenting with acute on chronic hypercarbia.  History of present illness   54 year old woman nonsmoker with hx of morbid obesity, chronic pain/anxiety presenting with acute on chronic hypercarbia.  Has a presumed diagnosis of lower ext cellulitis. She does not like wearing BIPAP but eventually was coerced into wearing it a few hours this AM.  ABG shows worsening retention.  PCCM consulted for further recs.  Patient is anxious and requesting meds by name for this.  Past Medical History  OSA w/o CPAP, chronic O2 need, OHS, homeless, "asthma", hidranitis, anxiety, chronic pain  Significant Hospital Events   3/23 admitted  Consults:  PCCM Procedures:  None  Significant Diagnostic Tests:  CTA chest- obese LE doppler neg  Micro Data:  COVID neg HIV neg MRSA swab neg  Antimicrobials:  Ceftriaxone 3/22-3/29 Cephalexin 3/30>> Interim history/subjective:  Pruritus  Somnolent Complaining of productive cough  Objective   Blood pressure 108/67, pulse 69, temperature 97.8 F (36.6 C), temperature source Oral, resp. rate 15, height 5\' 6"  (1.676 m), weight (!) 179.2 kg, last menstrual period 03/24/2009, SpO2 100 %.    FiO2 (%):  [30 %] 30 %   Intake/Output Summary (Last 24 hours) at 06/24/2019 1503 Last data filed at 06/23/2019 2300 Gross per 24 hour  Intake --  Output 400 ml  Net -400 ml   Filed Weights   06/14/19 1400 06/16/19 2129  Weight: (!) 182.8 kg (!) 179.2 kg    Examination: General: Morbidly obese, somnolent, able to hold conversations easily HENT: Mallampati 4 Lungs: Clear breath sounds Cardiovascular: S1-S2 appreciated Abdomen: Soft, bowel sounds appreciated  Resolved Hospital Problem list      Assessment & Plan:  Acute on chronic hypercarbic respiratory failure Obesity hypoventilation syndrome Obstructive sleep apnea Noncompliance with BiPAP -Continue oxygen supplementation -Cautious with sedating medications  Trying to tolerate BiPAP -Encouraged use BiPAP as tolerated -Will require noninvasive ventilator for long-term management of obesity hypoventilation -There may be ongoing issues with accommodation in social situation that may preclude being able to procure a device  Cellulitis management per primary -Currently on oral antibiotics  Extensive skin rash/itching  Morbid obesity Continue other lines of care  Sherrilyn Rist, MD [place] [facility] Pager: [phone]

## 2019-06-24 NOTE — Progress Notes (Signed)
NIV set up with [employer] pending signature to forms and faxing back to [place]. Fax number is provided on the form  Magda Paganini Holiday representative) can be reached at [phone]

## 2019-06-25 ENCOUNTER — Inpatient Hospital Stay (HOSPITAL_COMMUNITY): Payer: Medicaid Other

## 2019-06-25 DIAGNOSIS — R062 Wheezing: Secondary | ICD-10-CM

## 2019-06-25 DIAGNOSIS — J45901 Unspecified asthma with (acute) exacerbation: Secondary | ICD-10-CM

## 2019-06-25 MED ORDER — CAMPHOR-MENTHOL 0.5-0.5 % EX LOTN
TOPICAL_LOTION | CUTANEOUS | Status: DC | PRN
  Filled 2019-06-25 (×2): qty 222

## 2019-06-25 MED ORDER — FUROSEMIDE 10 MG/ML IJ SOLN
40.0000 mg | Freq: Once | INTRAMUSCULAR | Status: AC
  Administered 2019-06-25: 40 mg via INTRAVENOUS
  Filled 2019-06-25: qty 4

## 2019-06-25 MED ORDER — IPRATROPIUM-ALBUTEROL 0.5-2.5 (3) MG/3ML IN SOLN
3.0000 mL | Freq: Four times a day (QID) | RESPIRATORY_TRACT | Status: DC
  Administered 2019-06-25 – 2019-06-27 (×6): 3 mL via RESPIRATORY_TRACT
  Filled 2019-06-25 (×7): qty 3

## 2019-06-25 MED ORDER — IPRATROPIUM-ALBUTEROL 0.5-2.5 (3) MG/3ML IN SOLN: 3.0000 mL | Freq: Four times a day (QID) | RESPIRATORY_TRACT | Status: DC

## 2019-06-25 NOTE — Progress Notes (Signed)
Complained of leg pain , oxycodone  Given with vistaril upon req. Patient given PM care and made comfortable.

## 2019-06-25 NOTE — Plan of Care (Signed)
  Problem: Clinical Measurements: Goal: Will remain free from infection Outcome: Progressing   Problem: Clinical Measurements: Goal: Respiratory complications will improve Outcome: Progressing   Problem: Clinical Measurements: Goal: Cardiovascular complication will be avoided Outcome: Progressing   

## 2019-06-25 NOTE — Evaluation (Signed)
Physical Therapy Evaluation Patient Details Name: Kara Vincent MRN: [id number] DOB: Oct 14, 1965 Today's Date: 06/25/2019   History of Present Illness   Kara Vincent is a 54 y.o. year old female with medical history significant for morbid obesity, chronic pain/anxiety, chronic hypoxic/hypercarbic respiratory failure on 2 L nasal cannula, obesity hypoventilation syndrome who presented on 06/13/2019 with 1 week of bilateral leg pain and swelling, and shortness of breath and was admitted with working diagnosis of lower extremity cellulitis and panniculitis.    Clinical Impression  Pt admitted with above. Pt with inconsistent report of home set up and PLOF. Per RN pt lives in extended stay motel. Pt requesting hospital bed however based on functional assessment pt doesn't require hospital bed. Pt to benefit from HHPT however pt refusing and refusing rehab stating "i'm going back to my home." Pt sedentary at baseline using electric scooter or staying in bed primarily. Pt demo'd bed mobiltiy and std pvt transfer with close min guard however deferred ambulation due to bilat LE pain. Acute PT to cont to follow.    Follow Up Recommendations Home health PT;Supervision/Assistance - 24 hour    Equipment Recommendations  (shower chair)    Recommendations for Other Services       Precautions / Restrictions Precautions Precautions: Fall Precaution Comments: morbid obesity  Restrictions Weight Bearing Restrictions: No      Mobility  Bed Mobility Overal bed mobility: Needs Assistance Bed Mobility: Supine to Sit     Supine to sit: Supervision;HOB elevated     General bed mobility comments: increased time, verbal cues for sequencing, HOB elevated, use of bed rail, increased time  Transfers Overall transfer level: Needs assistance Equipment used: None Transfers: Sit to/from [facility] Sit to Stand: Min guard Stand pivot transfers: Min guard       General transfer  comment: increased time, pt pushing up from bed rail and BSC arm rests, wide base of support, unable to achieve full upright standing, no physical assist needed but dependent on UE support  Ambulation/Gait             General Gait Details: pt deferred due to LE pain  Stairs            Wheelchair Mobility    Modified Rankin (Stroke Patients Only)       Balance Overall balance assessment: Needs assistance Sitting-balance support: Feet supported;Single extremity supported Sitting balance-Leahy Scale: Poor Sitting balance - Comments: dependent on unilateral UE support   Standing balance support: Bilateral upper extremity supported;During functional activity Standing balance-Leahy Scale: Poor Standing balance comment: dependent on UE support for std pvt transfer                             Pertinent Vitals/Pain Pain Assessment: 0-10 Pain Score: 6  Pain Descriptors / Indicators: Sore Pain Intervention(s): Monitored during session    Home Living Family/patient expects to be discharged to:: Other (Comment)                 Additional Comments: pt states she lives in a house however RN reports she lives in an extended stay motel. Pt states she has a caregiver that is with her 24/7 and their is a ramp to enter home    Prior Function Level of Independence: Needs assistance   Gait / Transfers Assistance Needed: pt primarily uses electric scooter or stays in bed  ADL's / Homemaking Assistance Needed: caregiver assists with  bathing, dressing, grocery shopping  Comments: pt reports "i was walking to the bathroom until my legs started hurting"     Hand Dominance   Dominant Hand: Right    Extremity/Trunk Assessment   Upper Extremity Assessment Upper Extremity Assessment: Overall WFL for tasks assessed    Lower Extremity Assessment Lower Extremity Assessment: Generalized weakness    Cervical / Trunk Assessment Cervical / Trunk Assessment: Other  exceptions Cervical / Trunk Exceptions: morbidly obese  Communication   Communication: No difficulties  Cognition Arousal/Alertness: Lethargic(pt woken up from sleeping) Behavior During Therapy: Flat affect Overall Cognitive Status: No family/caregiver present to determine baseline cognitive functioning                                 General Comments: pt with delayed response, RN reports delayed response as well. Pt with delayed processing and sequencing however demo's safety awareness      General Comments General comments (skin integrity, edema, etc.): pt assisted to [facility] and then asked for time to have BM, VSS, pt on 2Lo2 via [place].     Exercises     Assessment/Plan    PT Assessment Patient needs continued PT services  PT Problem List Decreased strength;Decreased range of motion;Decreased activity tolerance;Decreased balance;Decreased mobility;Decreased coordination;Decreased cognition;Decreased knowledge of use of DME       PT Treatment Interventions DME instruction;Gait training;Stair training;Functional mobility training;Therapeutic activities;Therapeutic exercise;Balance training;Neuromuscular re-education    PT Goals (Current goals can be found in the Care Plan section)  Acute Rehab PT Goals Patient Stated Goal: home PT Goal Formulation: With patient Time For Goal Achievement: 07/09/19 Potential to Achieve Goals: Good    Frequency Min 3X/week   Barriers to discharge   unsure of real home situation    Co-evaluation               AM-PAC PT "6 Clicks" Mobility  Outcome Measure Help needed turning from your back to your side while in a flat bed without using bedrails?: None Help needed moving from lying on your back to sitting on the side of a flat bed without using bedrails?: None Help needed moving to and from a bed to a chair (including a wheelchair)?: A Little Help needed standing up from a chair using your arms (e.g., wheelchair or bedside  chair)?: A Little Help needed to walk in hospital room?: A Lot Help needed climbing 3-5 steps with a railing? : A Lot 6 Click Score: 18    End of Session Equipment Utilized During Treatment: Oxygen(2Lo2 via [place]) Activity Tolerance: Patient limited by fatigue Patient left: with call bell/phone within reach(on [facility]) Nurse Communication: Mobility status PT Visit Diagnosis: Unsteadiness on feet (R26.81);Difficulty in walking, not elsewhere classified (R26.2)    Time: 4656-8127 PT Time Calculation (min) (ACUTE ONLY): 19 min   Charges:   PT Evaluation $PT Eval Moderate Complexity: 1 Mod          Kittie Plater, PT, DPT Acute Rehabilitation Services Pager #: [phone] Office #: [phone]   Berline Lopes 06/25/2019, 1:51 PM

## 2019-06-25 NOTE — Progress Notes (Signed)
Patient refused medication and stated she would tell the nurse if and when she wanted to be placed on the Bi-pap. I told her to tell the nurse to call me if she wants to be placed on.

## 2019-06-25 NOTE — Progress Notes (Signed)
[facility]  PROGRESS NOTE  Kara Vincent ZSW:[id number] DOB: 10-12-1965 DOA: 06/13/2019 PCP: Ladell Pier, MD Admit date - 06/13/2019   Admitting Physician [name], MD  Outpatient Primary MD for the patient is Ladell Pier, MD  LOS - 11 Brief Narrative   Kara Vincent is a 54 y.o. year old female with medical history significant for morbid obesity, chronic pain/anxiety, chronic hypoxic/hypercarbic respiratory failure on 2 L nasal cannula, obesity hypoventilation syndrome who presented on 06/13/2019 with 1 week of bilateral leg pain and swelling, and shortness of breath and was admitted with working diagnosis of lower extremity cellulitis and panniculitis.  Hospital course : Chest x-ray with concern for atelectasis versus pneumonia, given shortness of breath CTA was obtained which was negative for any PE and Dopplers negative for DVT.  Course complicated by worsening CO2 retention resulting in decreased mental status and worsening respiratory function.  With initiation of NIPPV (patient had episodes of adherence) patient clinically improved.  PCCM recommends continuing BiPAP for long-term use as outpatient in setting of OSA/OHS and chronic O2 needs.  Arrangement of DME complicated by patient's home disposition.  Subjective  Today complains of left sided pain with deep breathing. Also reports cough productive of yellow sputum  A & P   Acute on chronic hypoxic and hypercarbic respiratory failure, improving.  At baseline patient is on 3L, currently on 4 L with SPO2 97%.  Likely multifactorial related to OSA, obesity hypoventilation syndrome/asthma. Patient  responding well to NIPPV in hospital.  Still having productive cough per her report, now having diffuse wheezing on lung fields. Unclear if all driven from OHS/OSA or some contribution from CHF -Continue incentive spirometry, flutter valve -Patient greatly benefits from BiPAP, PCCM recommends continue long-term  management -referral for home BiPAP per TOC  -- repeat CXR --schedule inhalers, may add prednisone if cxr unremarkable -Pulmicort, Brovana  Bilateral lower extremity cellulitis/panniculitis,resolved. Completed 10 day course (ceftriaxone 3/22-3/29, Keflex 3/30-4/1) -Remains afebrile, rash improving   Myoclonic jerking, resolved.  In the setting of hypercarbia.  Neurology evaluated and agree with most likely related to elevated CO2 due to above. -Continue to monitor, treating hypercarbia with NIPPV  CHF with preserved EF TTE on 3220 showed diastolic dysfunction.  Repeat TTE here shows preserved EF.  Lasix for vascular congestion with hypoxia earlier in hospital stay. Unclear volume status.  --repeat cxr -Daily weights, strict I's and O's -continue home oral Lasix, pending CXR may change to IV  Hyperkalemia, resolved -Have been holding home lisinopril -Monitor BMP  HTN, stable -Continue clonidine, Imdur,   CKD stage III, stable -Avoid nephrotoxins, continue to closely monitor on home Lasix and lisinopril  Anemia of chronic kidney disease, stable -Monitor hemoglobin on CBC  Bedbugs -contact precautions, supportive care  History of gout, stable -continue home allopurinol  History of sleep apnea has not been adherent as patient lost CPAP, continue CPAP at bedtime  Morbid obesity BMI 63.77 -Weight loss/dietary counseling provided during hospital stay.  Chronic pain -Tylenol as needed every 8 hours, Percocet 2 tabs as needed every 6 hours, closely monitor mental status, avoid narcotics given somnolence -Emphasized no outpatient prescriptions will be provided, will need to discuss with primary care physician -Cymbalta -continue bowel regimen with MiraLAX twice daily and senna docusate   Family Communication  : Spouse updated at bedside on 4/1  Code Status : Full  Disposition Plan  :  Patient is from home. Anticipated d/c date:  24 to 48 hours. Barriers to  d/c or necessity  for inpatient status:  Able to have stable mental status with adherence to NIPPV, safe arrangement of BiPAP on discharge, per case management) and O2,  Consults  : CCM  Procedures  : 06/20/19, TTE.  EF 60-65%: 3/23 bilateral venous duplex DVTs  DVT Prophylaxis  : Heparin  Lab Results  Component Value Date   PLT 326 06/24/2019    Diet :  Diet Order            Diet regular Room service appropriate? Yes; Fluid consistency: Thin  Diet effective now               Inpatient Medications Scheduled Meds: . allopurinol  100 mg Oral Daily  . arformoterol  15 mcg Nebulization BID  . budesonide (PULMICORT) nebulizer solution  0.25 mg Nebulization BID  . carbamide peroxide  5 drop Both EARS BID  . cloNIDine  0.2 mg Oral BID  . darifenacin  7.5 mg Oral Daily  . DULoxetine  30 mg Oral Daily  . enoxaparin (LOVENOX) injection  90 mg Subcutaneous Q24H  . furosemide  40 mg Oral Daily  . isosorbide mononitrate  30 mg Oral Daily  . mouth rinse  15 mL Mouth Rinse BID  . omega-3 acid ethyl esters  1 g Oral Daily  . pantoprazole  40 mg Oral Daily  . polyethylene glycol  17 g Oral BID  . senna-docusate  1 tablet Oral BID  . triamcinolone cream   Topical BID  . vitamin E  100 Units Oral Daily   Continuous Infusions: PRN Meds:.acetaminophen **OR** acetaminophen, acetaminophen-codeine, albuterol, ALPRAZolam, bisacodyl, camphor-menthol, guaiFENesin-dextromethorphan, hydrOXYzine, ondansetron (ZOFRAN) IV, oxyCODONE-acetaminophen  Antibiotics  :   Anti-infectives (From admission, onward)   Start     Dose/Rate Route Frequency Ordered Stop   06/21/19 1800  cephALEXin (KEFLEX) capsule 500 mg     500 mg Oral Every 6 hours 06/21/19 1236 06/23/19 1708   06/14/19 2000  cefTRIAXone (ROCEPHIN) 1 g in sodium chloride 0.9 % 100 mL IVPB  Status:  Discontinued     1 g 200 mL/hr over 30 Minutes Intravenous Every 24 hours 06/14/19 0756 06/21/19 1236   06/14/19 0430  vancomycin (VANCOREADY) IVPB 2000 mg/400 mL       2,000 mg 200 mL/hr over 120 Minutes Intravenous  Once 06/14/19 0430 06/14/19 0700   06/14/19 0400  cefTRIAXone (ROCEPHIN) 1 g in sodium chloride 0.9 % 100 mL IVPB     1 g 200 mL/hr over 30 Minutes Intravenous  Once 06/14/19 0346 06/14/19 0450       Objective   Vitals:   06/24/19 2349 06/25/19 0358 06/25/19 0811 06/25/19 0833  BP: 130/73 (!) 95/54  (!) 140/94  Pulse: 71 74  72  Resp: 16 17  17   Temp: 97.6 F (36.4 C) 97.6 F (36.4 C)  98.5 F (36.9 C)  TempSrc: Oral Oral  Oral  SpO2: 99% 99% 98% 98%  Weight:  (!) 188.7 kg    Height:        SpO2: 98 % O2 Flow Rate (L/min): 2 L/min FiO2 (%): 30 %  Wt Readings from Last 3 Encounters:  06/25/19 (!) 188.7 kg  04/14/19 (!) 176.9 kg  12/17/18 (!) 177.8 kg     Intake/Output Summary (Last 24 hours) at 06/25/2019 1030 Last data filed at 06/24/2019 1731 Gross per 24 hour  Intake --  Output 400 ml  Net -400 ml    Physical Exam:     Alert, oriented  x 4, no distress No new F.N deficits,  [place].AT, Normal respiratory effort on 3-4 L, decreased breath sounds at bases, diffuse wheezing in all lung fields RRR,No Gallops,Rubs or new Murmurs, no edema Obese abdomen, +ve B.Sounds, Abd Soft, No tenderness, No rebound, guarding or rigidity. Scant blanching erythema on anterior shin of right leg Scattered abrasions   I have personally reviewed the following:   Data Reviewed:  CBC Recent Labs  Lab 06/19/19 0207 06/20/19 0253 06/21/19 0252 06/22/19 0215 06/24/19 0815  WBC 9.2 9.7 9.6 8.9 9.8  HGB 9.7* 9.9* 10.2* 10.1* 10.6*  HCT 34.2* 35.0* 36.0 35.8* 37.0  PLT 309 324 322 317 326  MCV 102.1* 101.7* 102.0* 101.1* 102.2*  MCH 29.0 28.8 28.9 28.5 29.3  MCHC 28.4* 28.3* 28.3* 28.2* 28.6*  RDW 14.0 14.1 14.3 14.5 14.6  LYMPHSABS 1.5 2.0 1.9 2.0  --   MONOABS 0.4 0.4 0.4 0.4  --   EOSABS 0.3 0.3 0.3 0.3  --   BASOSABS 0.0 0.1 0.0 0.0  --     Chemistries  Recent Labs  Lab 06/19/19 0207 06/20/19 0253 06/21/19 0252  06/22/19 0215 06/24/19 0815  NA 141 143 143 139 139  K 5.3* 5.1 5.0 4.7 5.0  CL 95* 95* 94* 92* 94*  CO2 40* 38* 39* 36* 34*  GLUCOSE 113* 118* 115* 113* 141*  BUN 27* 21* 21* 23* 26*  CREATININE 1.32* 1.28* 1.57* 1.30* 1.38*  CALCIUM 8.6* 8.6* 8.7* 8.8* 9.0  MG 2.1 1.9 1.8 1.8  --   AST 11* 11* 13* 14*  --   ALT 12 11 13 14   --   ALKPHOS 53 51 55 56  --   BILITOT 0.5 0.7 0.5 0.4  --    ------------------------------------------------------------------------------------------------------------------ No results for input(s): CHOL, HDL, LDLCALC, TRIG, CHOLHDL, LDLDIRECT in the last 72 hours.  Lab Results  Component Value Date   HGBA1C 5.5 12/01/2017   ------------------------------------------------------------------------------------------------------------------ No results for input(s): TSH, T4TOTAL, T3FREE, THYROIDAB in the last 72 hours.  Invalid input(s): FREET3 ------------------------------------------------------------------------------------------------------------------ No results for input(s): VITAMINB12, FOLATE, FERRITIN, TIBC, IRON, RETICCTPCT in the last 72 hours.  Coagulation profile No results for input(s): INR, PROTIME in the last 168 hours.  No results for input(s): DDIMER in the last 72 hours.  Cardiac Enzymes No results for input(s): CKMB, TROPONINI, MYOGLOBIN in the last 168 hours.  Invalid input(s): CK ------------------------------------------------------------------------------------------------------------------    Component Value Date/Time   BNP 120.9 (H) 06/14/2019 9379    Micro Results No results found for this or any previous visit (from the past 240 hour(s)).  Radiology Reports DG Chest 2 View  Result Date: 06/13/2019 CLINICAL DATA:  Shortness of breath. Patient reports bilateral lower leg pain, redness and swelling. EXAM: CHEST - 2 VIEW COMPARISON:  CT angiogram chest 03/12/2019, chest radiograph 03/12/2019 FINDINGS: Limited evaluation  due to patient body habitus and shallow inspiration. Mild cardiomegaly appears unchanged. Mild ill-defined opacity within the lateral right lung base. No evidence of pleural effusion or pneumothorax. No acute bony abnormality. IMPRESSION: Limited examination as described. Shallow inspiration radiograph. Mild ill-defined opacity within the lateral right lung base which may reflect atelectasis. Pneumonia cannot be excluded. Unchanged mild cardiomegaly. Electronically Signed   By: Kellie Simmering DO   On: 06/13/2019 19:53   DG Tibia/Fibula Left  Result Date: 06/13/2019 CLINICAL DATA:  Bilateral lower leg pain. Redness and swelling. EXAM: LEFT TIBIA AND FIBULA - 2 VIEW COMPARISON:  None. FINDINGS: Cortical margins of the tibia and fibula are intact.  No fracture. No bony destruction or periosteal reaction. Moderate osteoarthritis of the knee joint. Mild osteoarthritis of the ankle. Generalized soft tissue edema and soft tissue prominence. No soft tissue air or radiopaque foreign body. Multiple phleboliths in the soft tissues. IMPRESSION: Generalized soft tissue edema.  No acute osseous abnormality. Electronically Signed   By: Keith Rake M.D.   On: 06/13/2019 19:51   DG Tibia/Fibula Right  Result Date: 06/13/2019 CLINICAL DATA:  Bilateral lower leg pain. Redness and swelling. EXAM: RIGHT TIBIA AND FIBULA - 2 VIEW COMPARISON:  None. FINDINGS: Cortical margins of the tibia and fibula are intact. No fracture. No bony destruction or periosteal reaction. Moderate osteoarthritis of the knee joint. Mild osteoarthritis of the ankle. Generalized soft tissue edema and soft tissue prominence. No soft tissue air or radiopaque foreign body. Multiple phleboliths in the soft tissues. IMPRESSION: Generalized soft tissue edema. No acute osseous abnormality. Electronically Signed   By: Keith Rake M.D.   On: 06/13/2019 19:50   DG Abd 1 View  Result Date: 06/16/2019 CLINICAL DATA:  Abdominal pain with nausea and  headaches. EXAM: ABDOMEN - 1 VIEW COMPARISON:  Abdominal radiographs CT abdomen/pelvis 11/28/2011. 12/27/2011 FINDINGS: Air distention of a nondescript bowel loop within the right hemiabdomen. Large colonic stool burden within the right colon and distal colon/rectum. No acute bony abnormality. Thoracic spondylosis. IMPRESSION: Air distention of nondescript bowel loop within the right hemiabdomen, nonspecific. Large stool burden within the right colon and distal colon/rectum. Electronically Signed   By: Kellie Simmering DO   On: 06/16/2019 09:08   CT Angio Chest PE W and/or Wo Contrast  Result Date: 06/14/2019 CLINICAL DATA:  Right leg pain and redness with swelling for 1 week EXAM: CT ANGIOGRAPHY CHEST WITH CONTRAST TECHNIQUE: Multidetector CT imaging of the chest was performed using the standard protocol during bolus administration of intravenous contrast. Multiplanar CT image reconstructions and MIPs were obtained to evaluate the vascular anatomy. CONTRAST:  64mL OMNIPAQUE IOHEXOL 350 MG/ML SOLN COMPARISON:  03/12/2019 FINDINGS: Cardiovascular: Limited opacification of the pulmonary arteries due to body habitus and bolus dispersion. No pulmonary embolism is seen, definitely nondiagnostic beyond the main and lobar level. Coronary calcification. No cardiomegaly or pericardial effusion. Mild atherosclerotic calcification. Mediastinum/Nodes: Negative for adenopathy or mass. Lungs/Pleura: Linear atelectasis/scarring. There is no edema, consolidation, effusion, or pneumothorax. Symmetric narrowing of the bilateral mainstem bronchi, likely bronchomalacia. Upper Abdomen: No acute finding Musculoskeletal: Spondylosis.  No acute or aggressive finding Review of the MIP images confirms the above findings. IMPRESSION: 1. Severely limited chest CTA due to body habitus and bolus dispersion. No pulmonary embolism is seen at the main or lobar levels. 2.  Aortic Atherosclerosis (ICD10-I70.0).  Coronary atherosclerosis  Electronically Signed   By: Monte Fantasia M.D.   On: 06/14/2019 04:08   DG CHEST PORT 1 VIEW  Result Date: 06/21/2019 CLINICAL DATA:  Shortness of breath EXAM: PORTABLE CHEST 1 VIEW COMPARISON:  June 20, 2019 FINDINGS: Mild bibasilar atelectasis. No edema or consolidation. There is cardiomegaly with a degree of pulmonary vascular congestion. IMPRESSION: No active disease. Electronically Signed   By: Lowella Grip III M.D.   On: 06/21/2019 08:57   DG CHEST PORT 1 VIEW  Result Date: 06/20/2019 CLINICAL DATA:  Shortness of breath EXAM: PORTABLE CHEST 1 VIEW COMPARISON:  Yesterday FINDINGS: Cardiomegaly and vascular pedicle widening accentuated by rotation. Low volume chest with diffuse vascular congestion. Interstitial prominence without Kerley lines. No definite effusion. IMPRESSION: CHF pattern.  Vascular congestion appears increased from yesterday. Electronically  Signed   By: Monte Fantasia M.D.   On: 06/20/2019 07:14   DG CHEST PORT 1 VIEW  Result Date: 06/19/2019 CLINICAL DATA:  Shortness of breath EXAM: PORTABLE CHEST 1 VIEW COMPARISON:  06/18/2019 FINDINGS: Bilateral interstitial prominence. Lingular airspace disease which may reflect atelectasis versus pneumonia. No pleural effusion or pneumothorax. Stable cardiomediastinal silhouette. No aggressive osseous lesion. IMPRESSION: Lingular airspace disease which may reflect atelectasis versus pneumonia. Cardiomegaly with pulmonary vascular congestion. Electronically Signed   By: Kathreen Devoid   On: 06/19/2019 07:58   DG CHEST PORT 1 VIEW  Result Date: 06/18/2019 CLINICAL DATA:  Shortness of breath. Cellulitis of lower extremity EXAM: PORTABLE CHEST 1 VIEW COMPARISON:  June 13, 2019 FINDINGS: The mediastinal contour is stable. Heart size is enlarged. Increased pulmonary interstitium is identified bilaterally. No focal pneumonia or pleural effusion is identified. The bony structures are stable. IMPRESSION: Congestive heart failure.  Electronically Signed   By: Abelardo Diesel M.D.   On: 06/18/2019 11:25   EEG adult  Result Date: 06/17/2019 Lora Havens, MD     06/17/2019  5:12 PM Patient Name: MARSELA KUAN MRN: [id number] Epilepsy Attending: Lora Havens Referring Physician/Provider: Dr. Osborne Oman Date: 06/17/2019 Duration: 21.55 mins Patient history: 54 year old female with jerking movements in arms.  EEG to evaluate for seizures. Level of alertness: Awake and asleep AEDs during EEG study: None Technical aspects: This EEG study was done with scalp electrodes positioned according to the 10-20 International system of electrode placement. Electrical activity was acquired at a sampling rate of 500Hz  and reviewed with a high frequency filter of 70Hz  and a low frequency filter of 1Hz . EEG data were recorded continuously and digitally stored. Description: The posterior dominant rhythm consists of 8 Hz activity of moderate voltage (25-35 uV) seen predominantly in posterior head regions, symmetric and reactive to eye opening and eye closing. Sleep was characterized by generalized 4-6hz  theta-delta slowing. Hyperventilation and photic stimulation were not performed. IMPRESSION: This study is within normal limits. No seizures or epileptiform discharges were seen throughout the recording. Lora Havens   ECHOCARDIOGRAM COMPLETE  Result Date: 06/20/2019    ECHOCARDIOGRAM REPORT   Patient Name:   DODI LEU Date of Exam: 06/20/2019 Medical Rec #:  [id number]         Height:       66.0 in Accession #:    [id number]        Weight:       395.1 lb Date of Birth:  1965-08-20          BSA:          2.671 m Patient Age:    56 years          BP:           114/71 mmHg Patient Gender: F                 HR:           76 bpm. Exam Location:  Inpatient Procedure: 2D Echo, Color Doppler and Cardiac Doppler Indications:    R06.9 DOE  History:        Patient has prior history of Echocardiogram examinations, most                 recent 07/09/2017.  CHF; Risk Factors:Sleep Apnea, Hypertension                 and Dyslipidemia.  Sonographer:    [place] Referring  Phys: [id number] Georgina Quint LATIF [facility]  Sonographer Comments: Technically difficult study with very poor image quality due to morbid obesity and patient being unable to tolerate probe pressure. IMPRESSIONS  1. Technically difficult study with limited views. Grossly normal LV systolic function. Left ventricular ejection fraction, by estimation, is 60 to 65%. Left ventricular diastolic parameters are indeterminate.  2. Right ventricular systolic function was not well visualized. The right ventricular size is not well visualized.  3. The mitral valve was not well visualized. No evidence of mitral valve regurgitation.  4. The aortic valve was not well visualized. Aortic valve regurgitation is not visualized. No aortic stenosis is present.  5. The inferior vena cava is dilated in size with >50% respiratory variability, suggesting right atrial pressure of 8 mmHg. FINDINGS  Left Ventricle: Left ventricular ejection fraction, by estimation, is 60 to 65%. The left ventricle has normal function. The left ventricle has no regional wall motion abnormalities. The left ventricular internal cavity size was normal in size. There is  Not well visualized left ventricular hypertrophy. Left ventricular diastolic parameters are indeterminate. Right Ventricle: The right ventricular size is not well visualized. Right vetricular wall thickness was not assessed. Right ventricular systolic function was not well visualized. Left Atrium: Left atrial size was not well visualized. Right Atrium: Right atrial size was not well visualized. Pericardium: Trivial pericardial effusion is present. Presence of pericardial fat pad. Mitral Valve: The mitral valve was not well visualized. No evidence of mitral valve regurgitation. Tricuspid Valve: The tricuspid valve is not well visualized. Tricuspid valve regurgitation is not demonstrated.  Aortic Valve: The aortic valve was not well visualized. Aortic valve regurgitation is not visualized. No aortic stenosis is present. Pulmonic Valve: The pulmonic valve was not well visualized. Pulmonic valve regurgitation is not visualized. Aorta: The aortic root was not well visualized. Venous: The inferior vena cava is dilated in size with greater than 50% respiratory variability, suggesting right atrial pressure of 8 mmHg. IAS/Shunts: The interatrial septum was not well visualized.  AORTIC VALVE LVOT Vmax:   116.00 cm/s LVOT Vmean:  63.900 cm/s LVOT VTI:    0.209 m  SHUNTS Systemic VTI: 0.21 m Oswaldo Milian MD Electronically signed by Oswaldo Milian MD Signature Date/Time: 06/20/2019/2:30:30 PM    Final    VAS [place] LOWER EXTREMITY VENOUS (DVT)  Result Date: 06/14/2019  Lower Venous DVTStudy Indications: Edema.  Limitations: Body habitus and poor ultrasound/tissue interface. Comparison Study: 07/07/17 previous Performing Technologist: Abram Sander RVS  Examination Guidelines: A complete evaluation includes B-mode imaging, spectral Doppler, color Doppler, and power Doppler as needed of all accessible portions of each vessel. Bilateral testing is considered an integral part of a complete examination. Limited examinations for reoccurring indications may be performed as noted. The reflux portion of the exam is performed with the patient in reverse Trendelenburg.  +---------+---------------+---------+-----------+----------+--------------+ RIGHT    CompressibilityPhasicitySpontaneityPropertiesThrombus Aging +---------+---------------+---------+-----------+----------+--------------+ CFV      Full           Yes      Yes                                 +---------+---------------+---------+-----------+----------+--------------+ SFJ      Full                                                        +---------+---------------+---------+-----------+----------+--------------+  FV Prox  Full                                                         +---------+---------------+---------+-----------+----------+--------------+ FV Mid   Full                                                        +---------+---------------+---------+-----------+----------+--------------+ FV DistalFull                                                        +---------+---------------+---------+-----------+----------+--------------+ PFV      Full                                                        +---------+---------------+---------+-----------+----------+--------------+ POP      Full           Yes      Yes                                 +---------+---------------+---------+-----------+----------+--------------+ PTV                                                   Not visualized +---------+---------------+---------+-----------+----------+--------------+ PERO                                                  Not visualized +---------+---------------+---------+-----------+----------+--------------+   +---------+---------------+---------+-----------+----------+--------------+ LEFT     CompressibilityPhasicitySpontaneityPropertiesThrombus Aging +---------+---------------+---------+-----------+----------+--------------+ CFV      Full           Yes      Yes                                 +---------+---------------+---------+-----------+----------+--------------+ SFJ      Full                                                        +---------+---------------+---------+-----------+----------+--------------+ FV Prox  Full                                                        +---------+---------------+---------+-----------+----------+--------------+ FV Mid  Full                                                        +---------+---------------+---------+-----------+----------+--------------+ FV DistalFull                                                         +---------+---------------+---------+-----------+----------+--------------+ PFV      Full                                                        +---------+---------------+---------+-----------+----------+--------------+ POP      Full           Yes      Yes                                 +---------+---------------+---------+-----------+----------+--------------+ PTV      Full                                                        +---------+---------------+---------+-----------+----------+--------------+ PERO                                                  Not visualized +---------+---------------+---------+-----------+----------+--------------+     Summary: BILATERAL: - No evidence of deep vein thrombosis seen in the lower extremities, bilaterally.   *See table(s) above for measurements and observations. Electronically signed by Deitra Mayo MD on 06/14/2019 at 1:09:05 PM.    Final      Time Spent in minutes  30     Desiree Hane M.D on 06/25/2019 at 10:30 AM  To page go to [email or web address]

## 2019-06-26 LAB — BASIC METABOLIC PANEL
Anion gap: 11 (ref 5–15)
BUN: 26 mg/dL — ABNORMAL HIGH (ref 6–20)
CO2: 34 mmol/L — ABNORMAL HIGH (ref 22–32)
Calcium: 8.6 mg/dL — ABNORMAL LOW (ref 8.9–10.3)
Chloride: 96 mmol/L — ABNORMAL LOW (ref 98–111)
Creatinine, Ser: 1.46 mg/dL — ABNORMAL HIGH (ref 0.44–1.00)
GFR calc Af Amer: 46 mL/min — ABNORMAL LOW (ref >60)
GFR calc non Af Amer: 40 mL/min — ABNORMAL LOW (ref >60)
Glucose, Bld: 111 mg/dL — ABNORMAL HIGH (ref 70–99)
Potassium: 4.7 mmol/L (ref 3.5–5.1)
Sodium: 141 mmol/L (ref 135–145)

## 2019-06-26 MED ORDER — NYSTATIN 100000 UNIT/GM EX POWD
Freq: Three times a day (TID) | CUTANEOUS | Status: DC
  Filled 2019-06-26: qty 15

## 2019-06-26 MED ORDER — BENZONATATE 100 MG PO CAPS
100.0000 mg | ORAL_CAPSULE | Freq: Two times a day (BID) | ORAL | Status: DC
  Administered 2019-06-26 – 2019-06-30 (×9): 100 mg via ORAL
  Filled 2019-06-26 (×9): qty 1

## 2019-06-26 MED ORDER — FUROSEMIDE 10 MG/ML IJ SOLN
40.0000 mg | Freq: Two times a day (BID) | INTRAMUSCULAR | Status: DC
  Administered 2019-06-26 (×2): 40 mg via INTRAVENOUS
  Filled 2019-06-26 (×2): qty 4

## 2019-06-26 MED ORDER — METHYLPREDNISOLONE SODIUM SUCC 40 MG IJ SOLR
40.0000 mg | Freq: Four times a day (QID) | INTRAMUSCULAR | Status: DC
  Administered 2019-06-26 – 2019-06-27 (×6): 40 mg via INTRAVENOUS
  Filled 2019-06-26 (×6): qty 1

## 2019-06-26 NOTE — Progress Notes (Signed)
Pt states unable to wipe herself and may not be able to do it when she gets home. Pt encouraged that when working with PT/OT that she expresses how she manages at home and for any interventions or strength building that may help pt. Pt was able to wipe herself by laying in the bed and then cleaning. Staff assisted where needed.

## 2019-06-26 NOTE — Progress Notes (Signed)
[facility]  PROGRESS NOTE  Kara Vincent JXB:[id number] DOB: 1965-12-17 DOA: 06/13/2019 PCP: Ladell Pier, MD Admit date - 06/13/2019   Admitting Physician [name], MD  Outpatient Primary MD for the patient is Ladell Pier, MD  LOS - 12 Brief Narrative   Kara Vincent is a 54 y.o. year old female with medical history significant for morbid obesity, chronic pain/anxiety, chronic hypoxic/hypercarbic respiratory failure on 2 L nasal cannula, obesity hypoventilation syndrome who presented on 06/13/2019 with 1 week of bilateral leg pain and swelling, and shortness of breath and was admitted with working diagnosis of lower extremity cellulitis and panniculitis.  Hospital course : Chest x-ray with concern for atelectasis versus pneumonia, given shortness of breath CTA was obtained which was negative for any PE and Dopplers negative for DVT.  Course complicated by worsening CO2 retention resulting in decreased mental status and worsening respiratory function.  With initiation of NIPPV (patient had episodes of adherence) patient clinically improved.  PCCM recommends continuing BiPAP for long-term use as outpatient in setting of OSA/OHS and chronic O2 needs.  Arrangement of DME complicated by patient's home disposition.  Subjective  Today continues to complain of chest pain with cough and all over body pain.still having cough with yellow phlegm. Feels her breathing is a little better  A & P   Acute on chronic hypoxic and hypercarbic respiratory failure, stable.  O2 status at baseline 4L however increased cough, sputum production and wheezing consistent with exacerbation.  Likely multifactorial related to OSA, obesity hypoventilation syndrome/asthma. Patient  responding well to NIPPV in hospital.  Still having productive cough and CXR shows increased vascular congestion/edema consistent with CHF  -Continue incentive spirometry, encouraged use of flutter valve -Patient  greatly benefits from BiPAP, PCCM recommends continue long-term management -referral for home BiPAP per TOC  --schedule inhalers q6 hours, added IV steroids, tessalon pears, schedule mucinex -Pulmicort, Brovana  CHF with preserved EF, with exacerbation TTE on 1308 showed diastolic dysfunction.  Repeat TTE here shows preserved EF.  Lasix for vascular congestion with hypoxia earlier in hospital stay. Repeat CXR shows vascular congestion. She is still wheezing. Only 700 cc out in 24 hours --IV lasix 40 mg BID ( s/p IV lasix 40 mg x 1 on 4/3) -Daily weights, strict I's and O's --monitor kidney function on BMP  Bilateral lower extremity cellulitis/panniculitis,resolved. Completed 10 day course (ceftriaxone 3/22-3/29, Keflex 3/30-4/1) -Remains afebrile, rash improving   Myoclonic jerking, resolved.  In the setting of hypercarbia.  Neurology evaluated and agree with most likely related to elevated CO2 due to above. -Continue to monitor, treating hypercarbia with NIPPV  Hyperkalemia, resolved -Have been holding home lisinopril -Monitor BMP  HTN, stable -Continue clonidine, Imdur,   CKD stage III, stable -Avoid nephrotoxins, continue to closely monitor on IV lasix  Anemia of chronic kidney disease, stable -Monitor hemoglobin on CBC  Bedbugs -contact precautions, supportive care  History of gout, stable -continue home allopurinol  History of sleep apnea has not been adherent as patient lost CPAP, continue CPAP at bedtime  Morbid obesity BMI 63.77 -Weight loss/dietary counseling provided during hospital stay.  Chronic pain -Tylenol as needed every 8 hours, Percocet 2 tabs as needed every 6 hours, closely monitor mental status, avoid narcotics given somnolence -Emphasized no outpatient prescriptions will be provided, will need to discuss with primary care physician -Cymbalta -continue bowel regimen with MiraLAX twice daily and senna docusate   Family Communication  : Spouse  updated at bedside on 4/1  Code Status : Full  Disposition Plan  :  Patient is from home. Anticipated d/c date:  2- 3 days. Barriers to d/c or necessity for inpatient status: needs IV lasix for congestion, needs scheduled inhalers and IV steroids for exacerbation  Consults  : CCM  Procedures  : 06/20/19, TTE.  EF 60-65%: 3/23 bilateral venous duplex DVTs  DVT Prophylaxis  : Heparin  Lab Results  Component Value Date   PLT 326 06/24/2019    Diet :  Diet Order            Diet regular Room service appropriate? Yes; Fluid consistency: Thin  Diet effective now               Inpatient Medications Scheduled Meds: . allopurinol  100 mg Oral Daily  . arformoterol  15 mcg Nebulization BID  . benzonatate  100 mg Oral BID  . budesonide (PULMICORT) nebulizer solution  0.25 mg Nebulization BID  . carbamide peroxide  5 drop Both EARS BID  . cloNIDine  0.2 mg Oral BID  . darifenacin  7.5 mg Oral Daily  . DULoxetine  30 mg Oral Daily  . enoxaparin (LOVENOX) injection  90 mg Subcutaneous Q24H  . furosemide  40 mg Intravenous BID  . ipratropium-albuterol  3 mL Nebulization Q6H  . isosorbide mononitrate  30 mg Oral Daily  . mouth rinse  15 mL Mouth Rinse BID  . methylPREDNISolone (SOLU-MEDROL) injection  40 mg Intravenous Q6H  . omega-3 acid ethyl esters  1 g Oral Daily  . pantoprazole  40 mg Oral Daily  . polyethylene glycol  17 g Oral BID  . senna-docusate  1 tablet Oral BID  . triamcinolone cream   Topical BID  . vitamin E  100 Units Oral Daily   Continuous Infusions: PRN Meds:.acetaminophen **OR** acetaminophen, acetaminophen-codeine, albuterol, ALPRAZolam, bisacodyl, camphor-menthol, guaiFENesin-dextromethorphan, hydrOXYzine, ondansetron (ZOFRAN) IV, oxyCODONE-acetaminophen  Antibiotics  :   Anti-infectives (From admission, onward)   Start     Dose/Rate Route Frequency Ordered Stop   06/21/19 1800  cephALEXin (KEFLEX) capsule 500 mg     500 mg Oral Every 6 hours 06/21/19 1236  06/23/19 1708   06/14/19 2000  cefTRIAXone (ROCEPHIN) 1 g in sodium chloride 0.9 % 100 mL IVPB  Status:  Discontinued     1 g 200 mL/hr over 30 Minutes Intravenous Every 24 hours 06/14/19 0756 06/21/19 1236   06/14/19 0430  vancomycin (VANCOREADY) IVPB 2000 mg/400 mL     2,000 mg 200 mL/hr over 120 Minutes Intravenous  Once 06/14/19 0430 06/14/19 0700   06/14/19 0400  cefTRIAXone (ROCEPHIN) 1 g in sodium chloride 0.9 % 100 mL IVPB     1 g 200 mL/hr over 30 Minutes Intravenous  Once 06/14/19 0346 06/14/19 0450       Objective   Vitals:   06/26/19 0441 06/26/19 0500 06/26/19 0835 06/26/19 0853  BP: 134/82   (!) 129/101  Pulse: 84  79   Resp: 18  16   Temp: 98.8 F (37.1 C)     TempSrc: Oral     SpO2: 94%  96%   Weight: (!) 188.8 kg (!) 186.4 kg    Height:        SpO2: 96 % O2 Flow Rate (L/min): 4.5 L/min FiO2 (%): 50 %  Wt Readings from Last 3 Encounters:  06/26/19 (!) 186.4 kg  04/14/19 (!) 176.9 kg  12/17/18 (!) 177.8 kg     Intake/Output Summary (Last 24 hours) at 06/26/2019  1116 Last data filed at 06/26/2019 0300 Gross per 24 hour  Intake --  Output 700 ml  Net -700 ml    Physical Exam:     Alert, oriented x 4, no distress No new F.N deficits,  [place].AT, Normal respiratory effort on 3-4 L, decreased breath sounds at bases, diffuse wheezing in all lung fields RRR,No Gallops,Rubs or new Murmurs, no edema Obese abdomen, +ve B.Sounds, Abd Soft, No tenderness, No rebound, guarding or rigidity. Scant blanching erythema on anterior shin of right leg Scattered abrasions   I have personally reviewed the following:   Data Reviewed:  CBC Recent Labs  Lab 06/20/19 0253 06/21/19 0252 06/22/19 0215 06/24/19 0815  WBC 9.7 9.6 8.9 9.8  HGB 9.9* 10.2* 10.1* 10.6*  HCT 35.0* 36.0 35.8* 37.0  PLT 324 322 317 326  MCV 101.7* 102.0* 101.1* 102.2*  MCH 28.8 28.9 28.5 29.3  MCHC 28.3* 28.3* 28.2* 28.6*  RDW 14.1 14.3 14.5 14.6  LYMPHSABS 2.0 1.9 2.0  --    MONOABS 0.4 0.4 0.4  --   EOSABS 0.3 0.3 0.3  --   BASOSABS 0.1 0.0 0.0  --     Chemistries  Recent Labs  Lab 06/20/19 0253 06/21/19 0252 06/22/19 0215 06/24/19 0815 06/26/19 0742  NA 143 143 139 139 141  K 5.1 5.0 4.7 5.0 4.7  CL 95* 94* 92* 94* 96*  CO2 38* 39* 36* 34* 34*  GLUCOSE 118* 115* 113* 141* 111*  BUN 21* 21* 23* 26* 26*  CREATININE 1.28* 1.57* 1.30* 1.38* 1.46*  CALCIUM 8.6* 8.7* 8.8* 9.0 8.6*  MG 1.9 1.8 1.8  --   --   AST 11* 13* 14*  --   --   ALT 11 13 14   --   --   ALKPHOS 51 55 56  --   --   BILITOT 0.7 0.5 0.4  --   --    ------------------------------------------------------------------------------------------------------------------ No results for input(s): CHOL, HDL, LDLCALC, TRIG, CHOLHDL, LDLDIRECT in the last 72 hours.  Lab Results  Component Value Date   HGBA1C 5.5 12/01/2017   ------------------------------------------------------------------------------------------------------------------ No results for input(s): TSH, T4TOTAL, T3FREE, THYROIDAB in the last 72 hours.  Invalid input(s): FREET3 ------------------------------------------------------------------------------------------------------------------ No results for input(s): VITAMINB12, FOLATE, FERRITIN, TIBC, IRON, RETICCTPCT in the last 72 hours.  Coagulation profile No results for input(s): INR, PROTIME in the last 168 hours.  No results for input(s): DDIMER in the last 72 hours.  Cardiac Enzymes No results for input(s): CKMB, TROPONINI, MYOGLOBIN in the last 168 hours.  Invalid input(s): CK ------------------------------------------------------------------------------------------------------------------    Component Value Date/Time   BNP 120.9 (H) 06/14/2019 8315    Micro Results No results found for this or any previous visit (from the past 240 hour(s)).  Radiology Reports DG Chest 2 View  Result Date: 06/13/2019 CLINICAL DATA:  Shortness of breath. Patient reports  bilateral lower leg pain, redness and swelling. EXAM: CHEST - 2 VIEW COMPARISON:  CT angiogram chest 03/12/2019, chest radiograph 03/12/2019 FINDINGS: Limited evaluation due to patient body habitus and shallow inspiration. Mild cardiomegaly appears unchanged. Mild ill-defined opacity within the lateral right lung base. No evidence of pleural effusion or pneumothorax. No acute bony abnormality. IMPRESSION: Limited examination as described. Shallow inspiration radiograph. Mild ill-defined opacity within the lateral right lung base which may reflect atelectasis. Pneumonia cannot be excluded. Unchanged mild cardiomegaly. Electronically Signed   By: Kellie Simmering DO   On: 06/13/2019 19:53   DG Tibia/Fibula Left  Result Date: 06/13/2019 CLINICAL DATA:  Bilateral lower leg pain. Redness and swelling. EXAM: LEFT TIBIA AND FIBULA - 2 VIEW COMPARISON:  None. FINDINGS: Cortical margins of the tibia and fibula are intact. No fracture. No bony destruction or periosteal reaction. Moderate osteoarthritis of the knee joint. Mild osteoarthritis of the ankle. Generalized soft tissue edema and soft tissue prominence. No soft tissue air or radiopaque foreign body. Multiple phleboliths in the soft tissues. IMPRESSION: Generalized soft tissue edema.  No acute osseous abnormality. Electronically Signed   By: Keith Rake M.D.   On: 06/13/2019 19:51   DG Tibia/Fibula Right  Result Date: 06/13/2019 CLINICAL DATA:  Bilateral lower leg pain. Redness and swelling. EXAM: RIGHT TIBIA AND FIBULA - 2 VIEW COMPARISON:  None. FINDINGS: Cortical margins of the tibia and fibula are intact. No fracture. No bony destruction or periosteal reaction. Moderate osteoarthritis of the knee joint. Mild osteoarthritis of the ankle. Generalized soft tissue edema and soft tissue prominence. No soft tissue air or radiopaque foreign body. Multiple phleboliths in the soft tissues. IMPRESSION: Generalized soft tissue edema. No acute osseous abnormality.  Electronically Signed   By: Keith Rake M.D.   On: 06/13/2019 19:50   DG Abd 1 View  Result Date: 06/16/2019 CLINICAL DATA:  Abdominal pain with nausea and headaches. EXAM: ABDOMEN - 1 VIEW COMPARISON:  Abdominal radiographs CT abdomen/pelvis 11/28/2011. 12/27/2011 FINDINGS: Air distention of a nondescript bowel loop within the right hemiabdomen. Large colonic stool burden within the right colon and distal colon/rectum. No acute bony abnormality. Thoracic spondylosis. IMPRESSION: Air distention of nondescript bowel loop within the right hemiabdomen, nonspecific. Large stool burden within the right colon and distal colon/rectum. Electronically Signed   By: Kellie Simmering DO   On: 06/16/2019 09:08   CT Angio Chest PE W and/or Wo Contrast  Result Date: 06/14/2019 CLINICAL DATA:  Right leg pain and redness with swelling for 1 week EXAM: CT ANGIOGRAPHY CHEST WITH CONTRAST TECHNIQUE: Multidetector CT imaging of the chest was performed using the standard protocol during bolus administration of intravenous contrast. Multiplanar CT image reconstructions and MIPs were obtained to evaluate the vascular anatomy. CONTRAST:  31mL OMNIPAQUE IOHEXOL 350 MG/ML SOLN COMPARISON:  03/12/2019 FINDINGS: Cardiovascular: Limited opacification of the pulmonary arteries due to body habitus and bolus dispersion. No pulmonary embolism is seen, definitely nondiagnostic beyond the main and lobar level. Coronary calcification. No cardiomegaly or pericardial effusion. Mild atherosclerotic calcification. Mediastinum/Nodes: Negative for adenopathy or mass. Lungs/Pleura: Linear atelectasis/scarring. There is no edema, consolidation, effusion, or pneumothorax. Symmetric narrowing of the bilateral mainstem bronchi, likely bronchomalacia. Upper Abdomen: No acute finding Musculoskeletal: Spondylosis.  No acute or aggressive finding Review of the MIP images confirms the above findings. IMPRESSION: 1. Severely limited chest CTA due to body  habitus and bolus dispersion. No pulmonary embolism is seen at the main or lobar levels. 2.  Aortic Atherosclerosis (ICD10-I70.0).  Coronary atherosclerosis Electronically Signed   By: Monte Fantasia M.D.   On: 06/14/2019 04:08   DG CHEST PORT 1 VIEW  Result Date: 06/25/2019 CLINICAL DATA:  Wheezing and cough EXAM: PORTABLE CHEST 1 VIEW COMPARISON:  06/21/2019 FINDINGS: Cardiomegaly and vascular pedicle widening with vascular congestion. No Kerley lines, effusion, or pneumothorax. There is artifact from EKG leads. IMPRESSION: Cardiomegaly and vascular congestion. Electronically Signed   By: Monte Fantasia M.D.   On: 06/25/2019 11:10   DG CHEST PORT 1 VIEW  Result Date: 06/21/2019 CLINICAL DATA:  Shortness of breath EXAM: PORTABLE CHEST 1 VIEW COMPARISON:  June 20, 2019 FINDINGS: Mild bibasilar atelectasis.  No edema or consolidation. There is cardiomegaly with a degree of pulmonary vascular congestion. IMPRESSION: No active disease. Electronically Signed   By: Lowella Grip III M.D.   On: 06/21/2019 08:57   DG CHEST PORT 1 VIEW  Result Date: 06/20/2019 CLINICAL DATA:  Shortness of breath EXAM: PORTABLE CHEST 1 VIEW COMPARISON:  Yesterday FINDINGS: Cardiomegaly and vascular pedicle widening accentuated by rotation. Low volume chest with diffuse vascular congestion. Interstitial prominence without Kerley lines. No definite effusion. IMPRESSION: CHF pattern.  Vascular congestion appears increased from yesterday. Electronically Signed   By: Monte Fantasia M.D.   On: 06/20/2019 07:14   DG CHEST PORT 1 VIEW  Result Date: 06/19/2019 CLINICAL DATA:  Shortness of breath EXAM: PORTABLE CHEST 1 VIEW COMPARISON:  06/18/2019 FINDINGS: Bilateral interstitial prominence. Lingular airspace disease which may reflect atelectasis versus pneumonia. No pleural effusion or pneumothorax. Stable cardiomediastinal silhouette. No aggressive osseous lesion. IMPRESSION: Lingular airspace disease which may reflect  atelectasis versus pneumonia. Cardiomegaly with pulmonary vascular congestion. Electronically Signed   By: Kathreen Devoid   On: 06/19/2019 07:58   DG CHEST PORT 1 VIEW  Result Date: 06/18/2019 CLINICAL DATA:  Shortness of breath. Cellulitis of lower extremity EXAM: PORTABLE CHEST 1 VIEW COMPARISON:  June 13, 2019 FINDINGS: The mediastinal contour is stable. Heart size is enlarged. Increased pulmonary interstitium is identified bilaterally. No focal pneumonia or pleural effusion is identified. The bony structures are stable. IMPRESSION: Congestive heart failure. Electronically Signed   By: Abelardo Diesel M.D.   On: 06/18/2019 11:25   EEG adult  Result Date: 06/17/2019 Lora Havens, MD     06/17/2019  5:12 PM Patient Name: CORNELIUS SCHUITEMA MRN: [id number] Epilepsy Attending: Lora Havens Referring Physician/Provider: Dr. Osborne Oman Date: 06/17/2019 Duration: 21.55 mins Patient history: 54 year old female with jerking movements in arms.  EEG to evaluate for seizures. Level of alertness: Awake and asleep AEDs during EEG study: None Technical aspects: This EEG study was done with scalp electrodes positioned according to the 10-20 International system of electrode placement. Electrical activity was acquired at a sampling rate of 500Hz  and reviewed with a high frequency filter of 70Hz  and a low frequency filter of 1Hz . EEG data were recorded continuously and digitally stored. Description: The posterior dominant rhythm consists of 8 Hz activity of moderate voltage (25-35 uV) seen predominantly in posterior head regions, symmetric and reactive to eye opening and eye closing. Sleep was characterized by generalized 4-6hz  theta-delta slowing. Hyperventilation and photic stimulation were not performed. IMPRESSION: This study is within normal limits. No seizures or epileptiform discharges were seen throughout the recording. Lora Havens   ECHOCARDIOGRAM COMPLETE  Result Date: 06/20/2019    ECHOCARDIOGRAM  REPORT   Patient Name:   CIEARA STIERWALT Date of Exam: 06/20/2019 Medical Rec #:  [id number]         Height:       66.0 in Accession #:    [id number]        Weight:       395.1 lb Date of Birth:  [birth year]          BSA:          2.671 m Patient Age:    54 years          BP:           114/71 mmHg Patient Gender: F                 HR:  76 bpm. Exam Location:  Inpatient Procedure: 2D Echo, Color Doppler and Cardiac Doppler Indications:    R06.9 DOE  History:        Patient has prior history of Echocardiogram examinations, most                 recent 07/09/2017. CHF; Risk Factors:Sleep Apnea, Hypertension                 and Dyslipidemia.  Sonographer:    Raquel Sarna Senior RDCS Referring Phys: [id number] Georgina Quint LATIF [facility]  Sonographer Comments: Technically difficult study with very poor image quality due to morbid obesity and patient being unable to tolerate probe pressure. IMPRESSIONS  1. Technically difficult study with limited views. Grossly normal LV systolic function. Left ventricular ejection fraction, by estimation, is 60 to 65%. Left ventricular diastolic parameters are indeterminate.  2. Right ventricular systolic function was not well visualized. The right ventricular size is not well visualized.  3. The mitral valve was not well visualized. No evidence of mitral valve regurgitation.  4. The aortic valve was not well visualized. Aortic valve regurgitation is not visualized. No aortic stenosis is present.  5. The inferior vena cava is dilated in size with >50% respiratory variability, suggesting right atrial pressure of 8 mmHg. FINDINGS  Left Ventricle: Left ventricular ejection fraction, by estimation, is 60 to 65%. The left ventricle has normal function. The left ventricle has no regional wall motion abnormalities. The left ventricular internal cavity size was normal in size. There is  Not well visualized left ventricular hypertrophy. Left ventricular diastolic parameters are indeterminate. Right Ventricle:  The right ventricular size is not well visualized. Right vetricular wall thickness was not assessed. Right ventricular systolic function was not well visualized. Left Atrium: Left atrial size was not well visualized. Right Atrium: Right atrial size was not well visualized. Pericardium: Trivial pericardial effusion is present. Presence of pericardial fat pad. Mitral Valve: The mitral valve was not well visualized. No evidence of mitral valve regurgitation. Tricuspid Valve: The tricuspid valve is not well visualized. Tricuspid valve regurgitation is not demonstrated. Aortic Valve: The aortic valve was not well visualized. Aortic valve regurgitation is not visualized. No aortic stenosis is present. Pulmonic Valve: The pulmonic valve was not well visualized. Pulmonic valve regurgitation is not visualized. Aorta: The aortic root was not well visualized. Venous: The inferior vena cava is dilated in size with greater than 50% respiratory variability, suggesting right atrial pressure of 8 mmHg. IAS/Shunts: The interatrial septum was not well visualized.  AORTIC VALVE LVOT Vmax:   116.00 cm/s LVOT Vmean:  63.900 cm/s LVOT VTI:    0.209 m  SHUNTS Systemic VTI: 0.21 m Oswaldo Milian MD Electronically signed by Oswaldo Milian MD Signature Date/Time: 06/20/2019/2:30:30 PM    Final    VAS [place] LOWER EXTREMITY VENOUS (DVT)  Result Date: 06/14/2019  Lower Venous DVTStudy Indications: Edema.  Limitations: Body habitus and poor ultrasound/tissue interface. Comparison Study: 07/07/17 previous Performing Technologist: Abram Sander RVS  Examination Guidelines: A complete evaluation includes B-mode imaging, spectral Doppler, color Doppler, and power Doppler as needed of all accessible portions of each vessel. Bilateral testing is considered an integral part of a complete examination. Limited examinations for reoccurring indications may be performed as noted. The reflux portion of the exam is performed with the patient in  reverse Trendelenburg.  +---------+---------------+---------+-----------+----------+--------------+ RIGHT    CompressibilityPhasicitySpontaneityPropertiesThrombus Aging +---------+---------------+---------+-----------+----------+--------------+ CFV      Full           Yes  Yes                                 +---------+---------------+---------+-----------+----------+--------------+ SFJ      Full                                                        +---------+---------------+---------+-----------+----------+--------------+ FV Prox  Full                                                        +---------+---------------+---------+-----------+----------+--------------+ FV Mid   Full                                                        +---------+---------------+---------+-----------+----------+--------------+ FV DistalFull                                                        +---------+---------------+---------+-----------+----------+--------------+ PFV      Full                                                        +---------+---------------+---------+-----------+----------+--------------+ POP      Full           Yes      Yes                                 +---------+---------------+---------+-----------+----------+--------------+ PTV                                                   Not visualized +---------+---------------+---------+-----------+----------+--------------+ PERO                                                  Not visualized +---------+---------------+---------+-----------+----------+--------------+   +---------+---------------+---------+-----------+----------+--------------+ LEFT     CompressibilityPhasicitySpontaneityPropertiesThrombus Aging +---------+---------------+---------+-----------+----------+--------------+ CFV      Full           Yes      Yes                                  +---------+---------------+---------+-----------+----------+--------------+ SFJ      Full                                                        +---------+---------------+---------+-----------+----------+--------------+  FV Prox  Full                                                        +---------+---------------+---------+-----------+----------+--------------+ FV Mid   Full                                                        +---------+---------------+---------+-----------+----------+--------------+ FV DistalFull                                                        +---------+---------------+---------+-----------+----------+--------------+ PFV      Full                                                        +---------+---------------+---------+-----------+----------+--------------+ POP      Full           Yes      Yes                                 +---------+---------------+---------+-----------+----------+--------------+ PTV      Full                                                        +---------+---------------+---------+-----------+----------+--------------+ PERO                                                  Not visualized +---------+---------------+---------+-----------+----------+--------------+     Summary: BILATERAL: - No evidence of deep vein thrombosis seen in the lower extremities, bilaterally.   *See table(s) above for measurements and observations. Electronically signed by Deitra Mayo MD on 06/14/2019 at 1:09:05 PM.    Final      Time Spent in minutes  30     Desiree Hane M.D on 06/26/2019 at 11:16 AM  To page go to [email or web address] - password [facility]

## 2019-06-27 ENCOUNTER — Telehealth: Payer: Self-pay | Admitting: Pulmonary Disease

## 2019-06-27 DIAGNOSIS — J9621 Acute and chronic respiratory failure with hypoxia: Secondary | ICD-10-CM

## 2019-06-27 DIAGNOSIS — J9622 Acute and chronic respiratory failure with hypercapnia: Secondary | ICD-10-CM

## 2019-06-27 LAB — BASIC METABOLIC PANEL
Anion gap: 13 (ref 5–15)
BUN: 31 mg/dL — ABNORMAL HIGH (ref 6–20)
CO2: 34 mmol/L — ABNORMAL HIGH (ref 22–32)
Calcium: 8.7 mg/dL — ABNORMAL LOW (ref 8.9–10.3)
Chloride: 93 mmol/L — ABNORMAL LOW (ref 98–111)
Creatinine, Ser: 1.72 mg/dL — ABNORMAL HIGH (ref 0.44–1.00)
GFR calc Af Amer: 38 mL/min — ABNORMAL LOW (ref >60)
GFR calc non Af Amer: 33 mL/min — ABNORMAL LOW (ref >60)
Glucose, Bld: 211 mg/dL — ABNORMAL HIGH (ref 70–99)
Potassium: 4.7 mmol/L (ref 3.5–5.1)
Sodium: 140 mmol/L (ref 135–145)

## 2019-06-27 MED ORDER — METHYLPREDNISOLONE SODIUM SUCC 40 MG IJ SOLR
40.0000 mg | Freq: Two times a day (BID) | INTRAMUSCULAR | Status: AC
  Administered 2019-06-28 (×2): 40 mg via INTRAVENOUS
  Filled 2019-06-27 (×2): qty 1

## 2019-06-27 NOTE — Progress Notes (Signed)
[facility]  PROGRESS NOTE  Kara Vincent QBH:[id number] DOB: [birth year] DOA: 06/13/2019 PCP: Ladell Pier, MD Admit date - 06/13/2019   Admitting Physician [name], MD  Outpatient Primary MD for the patient is Ladell Pier, MD  LOS - 13 Brief Narrative   Kara Vincent is a 54 y.o. year old female with medical history significant for morbid obesity, chronic pain/anxiety, chronic hypoxic/hypercarbic respiratory failure on 2 L nasal cannula, obesity hypoventilation syndrome who presented on 06/13/2019 with 1 week of bilateral leg pain and swelling, and shortness of breath and was admitted with working diagnosis of lower extremity cellulitis and panniculitis.  Hospital course : Chest x-ray with concern for atelectasis versus pneumonia, given shortness of breath CTA was obtained which was negative for any PE and Dopplers negative for DVT.  Course complicated by worsening CO2 retention resulting in decreased mental status and worsening respiratory function.  With initiation of NIPPV (patient had episodes of adherence) patient clinically improved.  PCCM recommends continuing BiPAP for long-term use as outpatient in setting of OSA/OHS and chronic O2 needs.  Arrangement of DME complicated by patient's home disposition.  Subjective  Today feels breathing is improving, still feels some wheezing but much better than yesterday.  Still having cough productive of yellow sputum.  No chest pain.  A & P   Acute on chronic hypoxic and hypercarbic respiratory failure, stable.  O2 status at baseline 4L however increased cough, sputum production and wheezing consistent with exacerbation.  Likely multifactorial related to OSA, obesity hypoventilation syndrome/asthma. Patient  responding well to NIPPV in hospital.  Still having productive cough and CXR shows increased vascular congestion/edema consistent with CHF  -Continue incentive spirometry, encouraged use of flutter  valve -Patient greatly benefits from BiPAP, PCCM recommends continue long-term management -referral for home BiPAP per TOC  --Wean IV steroids, currently on every 6, will reduce to twice daily, tessalon pears, schedule mucinex -Pulmicort, Brovana  CHF with preserved EF, with exacerbation TTE on 0973 showed diastolic dysfunction.  Repeat TTE here shows preserved EF.  Lasix for vascular congestion with hypoxia earlier in hospital stay. Repeat CXR shows vascular congestion, wheezing is improved 1.7 L --We will hold off on further IV Lasix diuresis in light of CKD -Anticipate transition to oral Lasix on 4/6 -Daily weights, strict I's and O's --monitor kidney function on BMP  Bilateral lower extremity cellulitis/panniculitis,resolved. Completed 10 day course (ceftriaxone 3/22-3/29, Keflex 3/30-4/1) -Remains afebrile, rash improving   Myoclonic jerking, resolved.  In the setting of hypercarbia.  Neurology evaluated and agree with most likely related to elevated CO2 due to above. -Continue to monitor, treating hypercarbia with NIPPV  Hyperkalemia, resolved -Have been holding home lisinopril -Monitor BMP  HTN, stable -Continue clonidine, Imdur,   CKD stage III, stable Baseline creatinine of 1.4-1.7 -Avoid nephrotoxins,  -continue to closely monitor while diuresing -Daily BMP  Anemia of chronic kidney disease, stable -No signs or symptoms of bleeding  Bedbugs -contact precautions, supportive care  History of gout, stable -continue home allopurinol  History of sleep apnea has not been adherent as patient lost CPAP, continue CPAP at bedtime  Morbid obesity BMI 63.77 -Weight loss/dietary counseling provided during hospital stay.  Chronic pain -Tylenol as needed every 8 hours, Percocet 2 tabs as needed every 6 hours, closely monitor mental status, avoid narcotics given somnolence -Emphasized no outpatient prescriptions will be provided, will need to discuss with primary care  physician -Cymbalta -continue bowel regimen with MiraLAX twice daily and senna docusate  Family Communication  : Spouse updated at bedside on 4/1  Code Status : Full  Disposition Plan  :  Patient is from home. Anticipated d/c date:  48 hours. Barriers to d/c or necessity for inpatient status:  Weaning IV steroids, determine Lasix regimen, ensure NIV on discharge, Consults  : PCCM  Procedures  : 06/20/19, TTE.  EF 60-65%: 3/23 bilateral venous duplex DVTs  DVT Prophylaxis  : Heparin  Lab Results  Component Value Date   PLT 326 06/24/2019    Diet :  Diet Order            Diet regular Room service appropriate? Yes; Fluid consistency: Thin  Diet effective now               Inpatient Medications Scheduled Meds:  allopurinol  100 mg Oral Daily   arformoterol  15 mcg Nebulization BID   benzonatate  100 mg Oral BID   budesonide (PULMICORT) nebulizer solution  0.25 mg Nebulization BID   carbamide peroxide  5 drop Both EARS BID   cloNIDine  0.2 mg Oral BID   darifenacin  7.5 mg Oral Daily   DULoxetine  30 mg Oral Daily   enoxaparin (LOVENOX) injection  90 mg Subcutaneous Q24H   isosorbide mononitrate  30 mg Oral Daily   mouth rinse  15 mL Mouth Rinse BID   [START ON 06/28/2019] methylPREDNISolone (SOLU-MEDROL) injection  40 mg Intravenous Q12H   nystatin   Topical TID   omega-3 acid ethyl esters  1 g Oral Daily   pantoprazole  40 mg Oral Daily   polyethylene glycol  17 g Oral BID   senna-docusate  1 tablet Oral BID   triamcinolone cream   Topical BID   vitamin E  100 Units Oral Daily   Continuous Infusions: PRN Meds:.acetaminophen **OR** acetaminophen, acetaminophen-codeine, albuterol, ALPRAZolam, bisacodyl, camphor-menthol, guaiFENesin-dextromethorphan, hydrOXYzine, ondansetron (ZOFRAN) IV, oxyCODONE-acetaminophen  Antibiotics  :   Anti-infectives (From admission, onward)   Start     Dose/Rate Route Frequency Ordered Stop   06/21/19 1800  cephALEXin  (KEFLEX) capsule 500 mg     500 mg Oral Every 6 hours 06/21/19 1236 06/23/19 1708   06/14/19 2000  cefTRIAXone (ROCEPHIN) 1 g in sodium chloride 0.9 % 100 mL IVPB  Status:  Discontinued     1 g 200 mL/hr over 30 Minutes Intravenous Every 24 hours 06/14/19 0756 06/21/19 1236   06/14/19 0430  vancomycin (VANCOREADY) IVPB 2000 mg/400 mL     2,000 mg 200 mL/hr over 120 Minutes Intravenous  Once 06/14/19 0430 06/14/19 0700   06/14/19 0400  cefTRIAXone (ROCEPHIN) 1 g in sodium chloride 0.9 % 100 mL IVPB     1 g 200 mL/hr over 30 Minutes Intravenous  Once 06/14/19 0346 06/14/19 0450       Objective   Vitals:   06/27/19 1100 06/27/19 1146 06/27/19 1148 06/27/19 1441  BP:   (!) 145/129 128/82  Pulse: 81  70   Resp: 18  13 16   Temp:  (!) 97 F (36.1 C) (!) 97 F (36.1 C)   TempSrc:  Oral Oral   SpO2: 94%  96%   Weight:      Height:        SpO2: 96 % O2 Flow Rate (L/min): 4 L/min FiO2 (%): 50 %  Wt Readings from Last 3 Encounters:  06/27/19 (!) 187 kg  04/14/19 (!) 176.9 kg  12/17/18 (!) 177.8 kg     Intake/Output Summary (Last 24 hours)  at 06/27/2019 1547 Last data filed at 06/27/2019 0900 Gross per 24 hour  Intake 240 ml  Output 1401 ml  Net -1161 ml    Physical Exam:     Alert, oriented x 4, no distress No new F.N deficits,  [place].AT, Normal respiratory effort on 3-4 L, decreased breath sounds at bases, occasional wheezing in upper airway RRR,No Gallops,Rubs or new Murmurs, no edema Obese abdomen, +ve B.Sounds, Abd Soft, No tenderness, No rebound, guarding or rigidity. Scant blanching erythema on anterior shin of right leg Scattered abrasions   I have personally reviewed the following:   Data Reviewed:  CBC Recent Labs  Lab 06/21/19 0252 06/22/19 0215 06/24/19 0815  WBC 9.6 8.9 9.8  HGB 10.2* 10.1* 10.6*  HCT 36.0 35.8* 37.0  PLT 322 317 326  MCV 102.0* 101.1* 102.2*  MCH 28.9 28.5 29.3  MCHC 28.3* 28.2* 28.6*  RDW 14.3 14.5 14.6  LYMPHSABS 1.9 2.0   --   MONOABS 0.4 0.4  --   EOSABS 0.3 0.3  --   BASOSABS 0.0 0.0  --     Chemistries  Recent Labs  Lab 06/21/19 0252 06/22/19 0215 06/24/19 0815 06/26/19 0742 06/27/19 0307  NA 143 139 139 141 140  K 5.0 4.7 5.0 4.7 4.7  CL 94* 92* 94* 96* 93*  CO2 39* 36* 34* 34* 34*  GLUCOSE 115* 113* 141* 111* 211*  BUN 21* 23* 26* 26* 31*  CREATININE 1.57* 1.30* 1.38* 1.46* 1.72*  CALCIUM 8.7* 8.8* 9.0 8.6* 8.7*  MG 1.8 1.8  --   --   --   AST 13* 14*  --   --   --   ALT 13 14  --   --   --   ALKPHOS 55 56  --   --   --   BILITOT 0.5 0.4  --   --   --    ------------------------------------------------------------------------------------------------------------------ No results for input(s): CHOL, HDL, LDLCALC, TRIG, CHOLHDL, LDLDIRECT in the last 72 hours.  Lab Results  Component Value Date   HGBA1C 5.5 12/01/2017   ------------------------------------------------------------------------------------------------------------------ No results for input(s): TSH, T4TOTAL, T3FREE, THYROIDAB in the last 72 hours.  Invalid input(s): FREET3 ------------------------------------------------------------------------------------------------------------------ No results for input(s): VITAMINB12, FOLATE, FERRITIN, TIBC, IRON, RETICCTPCT in the last 72 hours.  Coagulation profile No results for input(s): INR, PROTIME in the last 168 hours.  No results for input(s): DDIMER in the last 72 hours.  Cardiac Enzymes No results for input(s): CKMB, TROPONINI, MYOGLOBIN in the last 168 hours.  Invalid input(s): CK ------------------------------------------------------------------------------------------------------------------    Component Value Date/Time   BNP 120.9 (H) 06/14/2019 3762    Micro Results No results found for this or any previous visit (from the past 240 hour(s)).  Radiology Reports DG Chest 2 View  Result Date: 06/13/2019 CLINICAL DATA:  Shortness of breath. Patient reports  bilateral lower leg pain, redness and swelling. EXAM: CHEST - 2 VIEW COMPARISON:  CT angiogram chest 03/12/2019, chest radiograph 03/12/2019 FINDINGS: Limited evaluation due to patient body habitus and shallow inspiration. Mild cardiomegaly appears unchanged. Mild ill-defined opacity within the lateral right lung base. No evidence of pleural effusion or pneumothorax. No acute bony abnormality. IMPRESSION: Limited examination as described. Shallow inspiration radiograph. Mild ill-defined opacity within the lateral right lung base which may reflect atelectasis. Pneumonia cannot be excluded. Unchanged mild cardiomegaly. Electronically Signed   By: Kellie Simmering DO   On: 06/13/2019 19:53   DG Tibia/Fibula Left  Result Date: 06/13/2019 CLINICAL DATA:  Bilateral  lower leg pain. Redness and swelling. EXAM: LEFT TIBIA AND FIBULA - 2 VIEW COMPARISON:  None. FINDINGS: Cortical margins of the tibia and fibula are intact. No fracture. No bony destruction or periosteal reaction. Moderate osteoarthritis of the knee joint. Mild osteoarthritis of the ankle. Generalized soft tissue edema and soft tissue prominence. No soft tissue air or radiopaque foreign body. Multiple phleboliths in the soft tissues. IMPRESSION: Generalized soft tissue edema.  No acute osseous abnormality. Electronically Signed   By: Keith Rake M.D.   On: 06/13/2019 19:51   DG Tibia/Fibula Right  Result Date: 06/13/2019 CLINICAL DATA:  Bilateral lower leg pain. Redness and swelling. EXAM: RIGHT TIBIA AND FIBULA - 2 VIEW COMPARISON:  None. FINDINGS: Cortical margins of the tibia and fibula are intact. No fracture. No bony destruction or periosteal reaction. Moderate osteoarthritis of the knee joint. Mild osteoarthritis of the ankle. Generalized soft tissue edema and soft tissue prominence. No soft tissue air or radiopaque foreign body. Multiple phleboliths in the soft tissues. IMPRESSION: Generalized soft tissue edema. No acute osseous abnormality.  Electronically Signed   By: Keith Rake M.D.   On: 06/13/2019 19:50   DG Abd 1 View  Result Date: 06/16/2019 CLINICAL DATA:  Abdominal pain with nausea and headaches. EXAM: ABDOMEN - 1 VIEW COMPARISON:  Abdominal radiographs CT abdomen/pelvis 11/28/2011. 12/27/2011 FINDINGS: Air distention of a nondescript bowel loop within the right hemiabdomen. Large colonic stool burden within the right colon and distal colon/rectum. No acute bony abnormality. Thoracic spondylosis. IMPRESSION: Air distention of nondescript bowel loop within the right hemiabdomen, nonspecific. Large stool burden within the right colon and distal colon/rectum. Electronically Signed   By: Kellie Simmering DO   On: 06/16/2019 09:08   CT Angio Chest PE W and/or Wo Contrast  Result Date: 06/14/2019 CLINICAL DATA:  Right leg pain and redness with swelling for 1 week EXAM: CT ANGIOGRAPHY CHEST WITH CONTRAST TECHNIQUE: Multidetector CT imaging of the chest was performed using the standard protocol during bolus administration of intravenous contrast. Multiplanar CT image reconstructions and MIPs were obtained to evaluate the vascular anatomy. CONTRAST:  84mL OMNIPAQUE IOHEXOL 350 MG/ML SOLN COMPARISON:  03/12/2019 FINDINGS: Cardiovascular: Limited opacification of the pulmonary arteries due to body habitus and bolus dispersion. No pulmonary embolism is seen, definitely nondiagnostic beyond the main and lobar level. Coronary calcification. No cardiomegaly or pericardial effusion. Mild atherosclerotic calcification. Mediastinum/Nodes: Negative for adenopathy or mass. Lungs/Pleura: Linear atelectasis/scarring. There is no edema, consolidation, effusion, or pneumothorax. Symmetric narrowing of the bilateral mainstem bronchi, likely bronchomalacia. Upper Abdomen: No acute finding Musculoskeletal: Spondylosis.  No acute or aggressive finding Review of the MIP images confirms the above findings. IMPRESSION: 1. Severely limited chest CTA due to body  habitus and bolus dispersion. No pulmonary embolism is seen at the main or lobar levels. 2.  Aortic Atherosclerosis (ICD10-I70.0).  Coronary atherosclerosis Electronically Signed   By: Monte Fantasia M.D.   On: 06/14/2019 04:08   DG CHEST PORT 1 VIEW  Result Date: 06/25/2019 CLINICAL DATA:  Wheezing and cough EXAM: PORTABLE CHEST 1 VIEW COMPARISON:  06/21/2019 FINDINGS: Cardiomegaly and vascular pedicle widening with vascular congestion. No Kerley lines, effusion, or pneumothorax. There is artifact from EKG leads. IMPRESSION: Cardiomegaly and vascular congestion. Electronically Signed   By: Monte Fantasia M.D.   On: 06/25/2019 11:10   DG CHEST PORT 1 VIEW  Result Date: 06/21/2019 CLINICAL DATA:  Shortness of breath EXAM: PORTABLE CHEST 1 VIEW COMPARISON:  June 20, 2019 FINDINGS: Mild bibasilar atelectasis. No  edema or consolidation. There is cardiomegaly with a degree of pulmonary vascular congestion. IMPRESSION: No active disease. Electronically Signed   By: Lowella Grip III M.D.   On: 06/21/2019 08:57   DG CHEST PORT 1 VIEW  Result Date: 06/20/2019 CLINICAL DATA:  Shortness of breath EXAM: PORTABLE CHEST 1 VIEW COMPARISON:  Yesterday FINDINGS: Cardiomegaly and vascular pedicle widening accentuated by rotation. Low volume chest with diffuse vascular congestion. Interstitial prominence without Kerley lines. No definite effusion. IMPRESSION: CHF pattern.  Vascular congestion appears increased from yesterday. Electronically Signed   By: Monte Fantasia M.D.   On: 06/20/2019 07:14   DG CHEST PORT 1 VIEW  Result Date: 06/19/2019 CLINICAL DATA:  Shortness of breath EXAM: PORTABLE CHEST 1 VIEW COMPARISON:  06/18/2019 FINDINGS: Bilateral interstitial prominence. Lingular airspace disease which may reflect atelectasis versus pneumonia. No pleural effusion or pneumothorax. Stable cardiomediastinal silhouette. No aggressive osseous lesion. IMPRESSION: Lingular airspace disease which may reflect  atelectasis versus pneumonia. Cardiomegaly with pulmonary vascular congestion. Electronically Signed   By: Kathreen Devoid   On: 06/19/2019 07:58   DG CHEST PORT 1 VIEW  Result Date: 06/18/2019 CLINICAL DATA:  Shortness of breath. Cellulitis of lower extremity EXAM: PORTABLE CHEST 1 VIEW COMPARISON:  June 13, 2019 FINDINGS: The mediastinal contour is stable. Heart size is enlarged. Increased pulmonary interstitium is identified bilaterally. No focal pneumonia or pleural effusion is identified. The bony structures are stable. IMPRESSION: Congestive heart failure. Electronically Signed   By: Abelardo Diesel M.D.   On: 06/18/2019 11:25   EEG adult  Result Date: 06/17/2019 Lora Havens, MD     06/17/2019  5:12 PM Patient Name: Kara Vincent MRN: [id number] Epilepsy Attending: Lora Havens Referring Physician/Provider: Dr. Osborne Oman Date: 06/17/2019 Duration: 21.55 mins Patient history: 54 year old female with jerking movements in arms.  EEG to evaluate for seizures. Level of alertness: Awake and asleep AEDs during EEG study: None Technical aspects: This EEG study was done with scalp electrodes positioned according to the 10-20 International system of electrode placement. Electrical activity was acquired at a sampling rate of 500Hz  and reviewed with a high frequency filter of 70Hz  and a low frequency filter of 1Hz . EEG data were recorded continuously and digitally stored. Description: The posterior dominant rhythm consists of 8 Hz activity of moderate voltage (25-35 uV) seen predominantly in posterior head regions, symmetric and reactive to eye opening and eye closing. Sleep was characterized by generalized 4-6hz  theta-delta slowing. Hyperventilation and photic stimulation were not performed. IMPRESSION: This study is within normal limits. No seizures or epileptiform discharges were seen throughout the recording. Lora Havens   ECHOCARDIOGRAM COMPLETE  Result Date: 06/20/2019    ECHOCARDIOGRAM  REPORT   Patient Name:   Kara Vincent Date of Exam: 06/20/2019 Medical Rec #:  [id number]         Height:       66.0 in Accession #:    [id number]        Weight:       395.1 lb Date of Birth:  1966-01-21          BSA:          2.671 m Patient Age:    21 years          BP:           114/71 mmHg Patient Gender: F                 HR:  76 bpm. Exam Location:  Inpatient Procedure: 2D Echo, Color Doppler and Cardiac Doppler Indications:    R06.9 DOE  History:        Patient has prior history of Echocardiogram examinations, most                 recent 07/09/2017. CHF; Risk Factors:Sleep Apnea, Hypertension                 and Dyslipidemia.  Sonographer:    Raquel Sarna Senior RDCS Referring Phys: [id number] Georgina Quint LATIF [facility]  Sonographer Comments: Technically difficult study with very poor image quality due to morbid obesity and patient being unable to tolerate probe pressure. IMPRESSIONS  1. Technically difficult study with limited views. Grossly normal LV systolic function. Left ventricular ejection fraction, by estimation, is 60 to 65%. Left ventricular diastolic parameters are indeterminate.  2. Right ventricular systolic function was not well visualized. The right ventricular size is not well visualized.  3. The mitral valve was not well visualized. No evidence of mitral valve regurgitation.  4. The aortic valve was not well visualized. Aortic valve regurgitation is not visualized. No aortic stenosis is present.  5. The inferior vena cava is dilated in size with >50% respiratory variability, suggesting right atrial pressure of 8 mmHg. FINDINGS  Left Ventricle: Left ventricular ejection fraction, by estimation, is 60 to 65%. The left ventricle has normal function. The left ventricle has no regional wall motion abnormalities. The left ventricular internal cavity size was normal in size. There is  Not well visualized left ventricular hypertrophy. Left ventricular diastolic parameters are indeterminate. Right Ventricle:  The right ventricular size is not well visualized. Right vetricular wall thickness was not assessed. Right ventricular systolic function was not well visualized. Left Atrium: Left atrial size was not well visualized. Right Atrium: Right atrial size was not well visualized. Pericardium: Trivial pericardial effusion is present. Presence of pericardial fat pad. Mitral Valve: The mitral valve was not well visualized. No evidence of mitral valve regurgitation. Tricuspid Valve: The tricuspid valve is not well visualized. Tricuspid valve regurgitation is not demonstrated. Aortic Valve: The aortic valve was not well visualized. Aortic valve regurgitation is not visualized. No aortic stenosis is present. Pulmonic Valve: The pulmonic valve was not well visualized. Pulmonic valve regurgitation is not visualized. Aorta: The aortic root was not well visualized. Venous: The inferior vena cava is dilated in size with greater than 50% respiratory variability, suggesting right atrial pressure of 8 mmHg. IAS/Shunts: The interatrial septum was not well visualized.  AORTIC VALVE LVOT Vmax:   116.00 cm/s LVOT Vmean:  63.900 cm/s LVOT VTI:    0.209 m  SHUNTS Systemic VTI: 0.21 m Oswaldo Milian MD Electronically signed by Oswaldo Milian MD Signature Date/Time: 06/20/2019/2:30:30 PM    Final    VAS [place] LOWER EXTREMITY VENOUS (DVT)  Result Date: 06/14/2019  Lower Venous DVTStudy Indications: Edema.  Limitations: Body habitus and poor ultrasound/tissue interface. Comparison Study: 07/07/17 previous Performing Technologist: Abram Sander RVS  Examination Guidelines: A complete evaluation includes B-mode imaging, spectral Doppler, color Doppler, and power Doppler as needed of all accessible portions of each vessel. Bilateral testing is considered an integral part of a complete examination. Limited examinations for reoccurring indications may be performed as noted. The reflux portion of the exam is performed with the patient in  reverse Trendelenburg.  +---------+---------------+---------+-----------+----------+--------------+  RIGHT     Compressibility Phasicity Spontaneity Properties Thrombus Aging  +---------+---------------+---------+-----------+----------+--------------+  CFV       Full  Yes       Yes                                    +---------+---------------+---------+-----------+----------+--------------+  SFJ       Full                                                             +---------+---------------+---------+-----------+----------+--------------+  FV Prox   Full                                                             +---------+---------------+---------+-----------+----------+--------------+  FV Mid    Full                                                             +---------+---------------+---------+-----------+----------+--------------+  FV Distal Full                                                             +---------+---------------+---------+-----------+----------+--------------+  PFV       Full                                                             +---------+---------------+---------+-----------+----------+--------------+  POP       Full            Yes       Yes                                    +---------+---------------+---------+-----------+----------+--------------+  PTV                                                        Not visualized  +---------+---------------+---------+-----------+----------+--------------+  PERO                                                       Not visualized  +---------+---------------+---------+-----------+----------+--------------+   +---------+---------------+---------+-----------+----------+--------------+  LEFT      Compressibility Phasicity Spontaneity Properties Thrombus Aging  +---------+---------------+---------+-----------+----------+--------------+  CFV       Full  Yes       Yes                                     +---------+---------------+---------+-----------+----------+--------------+  SFJ       Full                                                             +---------+---------------+---------+-----------+----------+--------------+  FV Prox   Full                                                             +---------+---------------+---------+-----------+----------+--------------+  FV Mid    Full                                                             +---------+---------------+---------+-----------+----------+--------------+  FV Distal Full                                                             +---------+---------------+---------+-----------+----------+--------------+  PFV       Full                                                             +---------+---------------+---------+-----------+----------+--------------+  POP       Full            Yes       Yes                                    +---------+---------------+---------+-----------+----------+--------------+  PTV       Full                                                             +---------+---------------+---------+-----------+----------+--------------+  PERO                                                       Not visualized  +---------+---------------+---------+-----------+----------+--------------+     Summary: BILATERAL: - No evidence of deep vein thrombosis seen in the lower extremities, bilaterally.   *  See table(s) above for measurements and observations. Electronically signed by Deitra Mayo MD on 06/14/2019 at 1:09:05 PM.    Final      Time Spent in minutes  30     Desiree Hane M.D on 06/27/2019 at 3:47 PM  To page go to [email or web address] - password [facility]

## 2019-06-27 NOTE — Progress Notes (Signed)
NAME:  Kara Vincent, MRN:  [id number], DOB:  1965-07-22, LOS: 67 ADMISSION DATE:  06/13/2019, CONSULTATION DATE:  06/18/2019 REFERRING MD:  Alfredia Ferguson CHIEF COMPLAINT:  SOB   Brief History   54 year old woman with hx of morbid obesity, chronic pain/anxiety presenting with acute on chronic hypercarbia.  Past Medical History  OSA w/o CPAP, chronic O2 need, OHS, homeless, "asthma", hidranitis, anxiety, chronic pain  Consults:  Neurology  Significant Diagnostic Tests:  CT angio chest 3/23 >> atherosclerosis Doppler legs b/l 3/23 >> no DVT Echo 3/29 >> EF 60 to 65%  Micro Data:  SARS CoV2 TAT 3/23 >> negative  Antimicrobials:  Ceftriaxone 3/22 >> 3/29 Cephalexin 3/30 >> 4/01  Interim history/subjective:  Slept well with Bipap.  Wearing oxygen.  Not having cough.  Objective   BP (!) 145/129   Pulse 70   Temp (!) 97 F (36.1 C) (Oral)   Resp 13   Ht 5\' 6"  (1.676 m)   Wt (!) 187 kg   LMP 03/24/2009   SpO2 96%   BMI 66.54 kg/m   I/O last 3 completed shifts: In: -  Out: 2401 [Urine:2400; Stool:1]  Physical examination: General - alert Eyes - pupils reactive ENT - no sinus tenderness, no stridor Cardiac - regular rate/rhythm, no murmur Chest - equal breath sounds b/l, no wheezing or rales Abdomen - soft, non tender, + bowel sounds Extremities - no cyanosis, clubbing, or edema Skin - no rashes Neuro - follows commands Lymphatics - no lymphadenopathy Psych - normal mood and behavior   Assessment & Plan:   Acute on chronic hypoxic/hypercapnic respiratory failure. - from OSA/OHS - continue oxygen to keep SpO2 90 to 95% - will need to arrange for Trilogy home vent prior to discharge, and then arrange for sleep testing as outpt to further adjust therapy  Asthma exacerbation. - wean off solumedrol as tolerated - scheduled BDs - bronchial hygiene  Best Practice:  Diet: Regular DVT prophylaxis: lovenox SUP: not indicated Mobility: as tolerated Goals of care:  full code Disposition: telemetry  Labs:   CMP Latest Ref Rng & Units 06/27/2019 06/26/2019 06/24/2019  Glucose 70 - 99 mg/dL 211(H) 111(H) 141(H)  BUN 6 - 20 mg/dL 31(H) 26(H) 26(H)  Creatinine 0.44 - 1.00 mg/dL 1.72(H) 1.46(H) 1.38(H)  Sodium 135 - 145 mmol/L 140 141 139  Potassium 3.5 - 5.1 mmol/L 4.7 4.7 5.0  Chloride 98 - 111 mmol/L 93(L) 96(L) 94(L)  CO2 22 - 32 mmol/L 34(H) 34(H) 34(H)  Calcium 8.9 - 10.3 mg/dL 8.7(L) 8.6(L) 9.0  Total Protein 6.5 - 8.1 g/dL - - -  Total Bilirubin 0.3 - 1.2 mg/dL - - -  Alkaline Phos 38 - 126 U/L - - -  AST 15 - 41 U/L - - -  ALT 0 - 44 U/L - - -    CBC Latest Ref Rng & Units 06/24/2019 06/22/2019 06/21/2019  WBC 4.0 - 10.5 K/uL 9.8 8.9 9.6  Hemoglobin 12.0 - 15.0 g/dL 10.6(L) 10.1(L) 10.2(L)  Hematocrit 36.0 - 46.0 % 37.0 35.8(L) 36.0  Platelets 150 - 400 K/uL 326 317 322    ABG    Component Value Date/Time   PHART 7.366 06/21/2019 1111   PCO2ART 74.4 (HH) 06/21/2019 1111   PO2ART 110 (H) 06/21/2019 1111   HCO3 41.7 (H) 06/21/2019 1111   TCO2 34 (H) 07/10/2017 2056   O2SAT 98.1 06/21/2019 1111    Signature:  Chesley Mires, MD [place] Pager - [phone] -  [phone] 06/27/2019, 12:12 PM

## 2019-06-27 NOTE — Telephone Encounter (Signed)
This patient is being ordered an NIV at [facility]. DME ADAPT  Dr. Ander Slade please advise if anything further needs to be ordered or done.

## 2019-06-27 NOTE — Progress Notes (Signed)
Physical Therapy Treatment Patient Details Name: HELAYNA Vincent MRN: [id number] DOB: 1965-07-20 Today's Date: 06/27/2019    History of Present Illness  Kara Vincent is a 54 y.o. year old female with medical history significant for morbid obesity, chronic pain/anxiety, chronic hypoxic/hypercarbic respiratory failure on 2 L nasal cannula, obesity hypoventilation syndrome who presented on 06/13/2019 with 1 week of bilateral leg pain and swelling, and shortness of breath and was admitted with working diagnosis of lower extremity cellulitis and panniculitis.    PT Comments    Pt making slow, steady progress. Likely not too far from baseline as far as mobility concerned.    Follow Up Recommendations  Home health PT;Supervision/Assistance - 24 hour     Equipment Recommendations  (shower chair)    Recommendations for Other Services       Precautions / Restrictions Precautions Precautions: Fall Precaution Comments: morbid obesity  Restrictions Weight Bearing Restrictions: No    Mobility  Bed Mobility               General bed mobility comments: Pt up in chair  Transfers Overall transfer level: Needs assistance Equipment used: None;Rolling walker (2 wheeled) Transfers: Sit to/from [employer] Transfers Sit to Stand: Supervision Stand pivot transfers: Supervision       General transfer comment: supervision for lines/safety  Ambulation/Gait Ambulation/Gait assistance: Min guard Gait Distance (Feet): 5 Feet Assistive device: Rolling walker (2 wheeled) Gait Pattern/deviations: Step-through pattern;Decreased step length - right;Decreased step length - left;Wide base of support;Trunk flexed Gait velocity: decr Gait velocity interpretation: <1.31 ft/sec, indicative of household ambulator General Gait Details: assist for lines/safety. Distance limited by pt incontinent of urine   Stairs             Wheelchair Mobility    Modified Rankin (Stroke  Patients Only)       Balance Overall balance assessment: Needs assistance Sitting-balance support: Feet supported;No upper extremity supported Sitting balance-Leahy Scale: Fair     Standing balance support: Bilateral upper extremity supported;During functional activity Standing balance-Leahy Scale: Poor Standing balance comment: UE support and supervision for static standing                            Cognition Arousal/Alertness: Awake/alert Behavior During Therapy: WFL for tasks assessed/performed Overall Cognitive Status: Within Functional Limits for tasks assessed                                        Exercises      General Comments General comments (skin integrity, edema, etc.): Pt on 4L of O2      Pertinent Vitals/Pain Pain Assessment: Faces Faces Pain Scale: Hurts little more Pain Location: RLE Pain Descriptors / Indicators: Sore Pain Intervention(s): Monitored during session    Home Living                      Prior Function            PT Goals (current goals can now be found in the care plan section) Acute Rehab PT Goals Patient Stated Goal: home Progress towards PT goals: Progressing toward goals    Frequency    Min 3X/week      PT Plan Current plan remains appropriate    Co-evaluation              AM-PAC PT "  6 Clicks" Mobility   Outcome Measure  Help needed turning from your back to your side while in a flat bed without using bedrails?: None Help needed moving from lying on your back to sitting on the side of a flat bed without using bedrails?: None Help needed moving to and from a bed to a chair (including a wheelchair)?: A Little Help needed standing up from a chair using your arms (e.g., wheelchair or bedside chair)?: None Help needed to walk in hospital room?: A Lot Help needed climbing 3-5 steps with a railing? : A Lot 6 Click Score: 19    End of Session Equipment Utilized During  Treatment: Oxygen Activity Tolerance: Patient limited by fatigue Patient left: with call bell/phone within reach;in chair(on BSC)   PT Visit Diagnosis: Unsteadiness on feet (R26.81);Difficulty in walking, not elsewhere classified (R26.2)     Time: 6546-5035 PT Time Calculation (min) (ACUTE ONLY): 17 min  Charges:  $Therapeutic Activity: 8-22 mins                     [place] Pager [phone] Office [place] 06/27/2019, 2:35 PM

## 2019-06-27 NOTE — TOC Progression Note (Signed)
Transition of Care [facility]) - Progression Note    Patient Details  Name: AVALEEN BROWNLEY MRN: [id number] Date of Birth: 03/29/1965  Transition of Care [facility]) CM/SW [place], RN Phone Number: 06/27/2019, 3:45 PM  Clinical Narrative:    Per PT recommendation and MD request CM has attempted to set up [facility] services. [facility] ,[facility], [facility], [facility], [facility] and [facility] have all declined to offer services due noted bed bug infestation. Agencies state that this is against their policies. MD has been made aware that CM will not be able to make referral for home health services at this time.     Expected Discharge Plan: Home/Self Care(friend to provide 24 hour care) Barriers to Discharge: Continued Medical Work up  Expected Discharge Plan and Services Expected Discharge Plan: Home/Self Care(friend to provide 24 hour care)   Discharge Planning Services: CM Consult   Living arrangements for the past 2 months: Homeless(living in hotel with friend)                   DME Agency: [facility](previously on O2 prior to admit)                   Social Determinants of Health (SDOH) Interventions    Readmission Risk Interventions No flowsheet data found.

## 2019-06-27 NOTE — Plan of Care (Signed)
  Problem: Clinical Measurements: Goal: Respiratory complications will improve Outcome: Progressing Goal: Cardiovascular complication will be avoided Outcome: Progressing   Problem: Activity: Goal: Risk for activity intolerance will decrease Outcome: Progressing   Problem: Nutrition: Goal: Adequate nutrition will be maintained Outcome: Progressing   

## 2019-06-27 NOTE — Progress Notes (Signed)
Patient took BIPAP HS off. Patient is back on 3L [place]. Patient tolerating well.

## 2019-06-28 DIAGNOSIS — R05 Cough: Secondary | ICD-10-CM

## 2019-06-28 LAB — BASIC METABOLIC PANEL
Anion gap: 11 (ref 5–15)
BUN: 32 mg/dL — ABNORMAL HIGH (ref 6–20)
CO2: 35 mmol/L — ABNORMAL HIGH (ref 22–32)
Calcium: 8.8 mg/dL — ABNORMAL LOW (ref 8.9–10.3)
Chloride: 94 mmol/L — ABNORMAL LOW (ref 98–111)
Creatinine, Ser: 1.51 mg/dL — ABNORMAL HIGH (ref 0.44–1.00)
GFR calc Af Amer: 45 mL/min — ABNORMAL LOW (ref >60)
GFR calc non Af Amer: 39 mL/min — ABNORMAL LOW (ref >60)
Glucose, Bld: 240 mg/dL — ABNORMAL HIGH (ref 70–99)
Potassium: 4.5 mmol/L (ref 3.5–5.1)
Sodium: 140 mmol/L (ref 135–145)

## 2019-06-28 LAB — HEMOGLOBIN A1C
Hgb A1c MFr Bld: 6 % — ABNORMAL HIGH (ref 4.8–5.6)
Mean Plasma Glucose: 125.5 mg/dL

## 2019-06-28 LAB — GLUCOSE, CAPILLARY: Glucose-Capillary: 185 mg/dL — ABNORMAL HIGH (ref 70–99)

## 2019-06-28 MED ORDER — FUROSEMIDE 40 MG PO TABS
40.0000 mg | ORAL_TABLET | Freq: Every day | ORAL | Status: DC
  Administered 2019-06-28 – 2019-06-30 (×3): 40 mg via ORAL
  Filled 2019-06-28 (×3): qty 1

## 2019-06-28 MED ORDER — INSULIN ASPART 100 UNIT/ML ~~LOC~~ SOLN: 0.0000 [IU] | Freq: Every day | SUBCUTANEOUS | Status: DC

## 2019-06-28 MED ORDER — INSULIN ASPART 100 UNIT/ML ~~LOC~~ SOLN: 0.0000 [IU] | Freq: Three times a day (TID) | SUBCUTANEOUS | Status: DC

## 2019-06-28 NOTE — TOC Progression Note (Signed)
Transition of Care [facility]) - Progression Note    Patient Details  Name: Kara Vincent MRN: [id number] Date of Birth: [birth year]  Transition of Care [facility]) CM/SW [place], RN Phone Number: 06/28/2019, 2:17 PM  Clinical Narrative:  Message received from Raford Pitcher) who states that the agency cannot put a therapist or Trilogy niv in an environment with bedbugs. The patient will need permanent housing that has passed home inspection prior to discharge and setup. The agency has withdrawn from the referral at this time. Message has been sent to MD. CM has contacted [place] and explained the situation to the [place] Caryl Pina) who will contact his supervisor to determine if they are able to offer assistance in providing the trilogy. Caryl Pina states that this process may take at minimum of 4 days for authorization with a usual time frame of 7-10 days. Caryl Pina reports that the therapist is currently looking at the patients information and will update CM when he has an answer.        Expected Discharge Plan: Home/Self Care(friend to provide 24 hour care) Barriers to Discharge: Continued Medical Work up  Expected Discharge Plan and Services Expected Discharge Plan: Home/Self Care(friend to provide 24 hour care)   Discharge Planning Services: CM Consult   Living arrangements for the past 2 months: Homeless(living in hotel with friend)                   DME Agency: [facility](previously on O2 prior to admit)                   Social Determinants of Health (SDOH) Interventions    Readmission Risk Interventions No flowsheet data found.

## 2019-06-28 NOTE — Progress Notes (Addendum)
[facility]  PROGRESS NOTE  KENYON ESHLEMAN EXH:[id number] DOB: 1965/09/03 DOA: 06/13/2019 PCP: Ladell Pier, MD Admit date - 06/13/2019   Admitting Physician [name], MD  Outpatient Primary MD for the patient is Ladell Pier, MD  LOS - 14 Brief Narrative   Kara Vincent is a 54 y.o. year old female with medical history significant for morbid obesity, chronic pain/anxiety, chronic hypoxic/hypercarbic respiratory failure on 2 L nasal cannula, obesity hypoventilation syndrome who presented on 06/13/2019 with 1 week of bilateral leg pain and swelling, and shortness of breath and was admitted with working diagnosis of lower extremity cellulitis and panniculitis.  Hospital course : Chest x-ray with concern for atelectasis versus pneumonia, given shortness of breath CTA was obtained which was negative for any PE and Dopplers negative for DVT.  Course complicated by worsening CO2 retention resulting in decreased mental status and worsening respiratory function.  With initiation of NIPPV (patient had episodes of adherence) patient clinically improved.  PCCM recommends continuing BiPAP for long-term use as outpatient in setting of OSA/OHS and chronic O2 needs.  Arrangement of NIV at home complicated by patient's home disposition--due to bed bugs in home agencies are declining to assess. Her course was complicated by mild CHF exacerbation as evident by worsening wheezing and dyspnea with vascular congestion onCXR that required IV lasix diuresis in addition to scheduled inhalers and IV solumedrol. Transitioning to oral lasix on 4/6.   Subjective  Today feels she thinks she is wheezing more and still coughing. Worried about going home without her breathing machine  A & P   Acute on chronic hypoxic and hypercarbic respiratory failure, multifactorial etiology related to OSA, obesity hypoventilation syndrome/asthma, stable.  O2 status at baseline 4L, wheezing scant. Patient   responding well to NIPPV in hospital.   -Patient greatly benefits from BiPAP, PCCM recommends continue long-term management -referral for home BiPAP per TOC but having difficulty bc of patient's living situation (bed bugs in extended stay hotel) --Wean IV steroids off today --transition to oral lasix, --IS, flutter valve, tessalon pears, schedule mucinex -Pulmicort, Brovana --once able to get home NIV will need to touch base with PCCM to get appropriate vent settings  CHF with preserved EF, with exacerbation, improving TTE on 3810 showed diastolic dysfunction.  Repeat TTE here shows preserved EF.  Net -2.6 L since hospital stay weights unreliable in charting. -Took a break on IV Lasix yesterday due to CKD, will resume home Lasix 40 mg daily -Monitor output -Monitor BMP  Bilateral lower extremity cellulitis/panniculitis,resolved. Completed 10 day course (ceftriaxone 3/22-3/29, Keflex 3/30-4/1) -Remains afebrile, rash improving   Myoclonic jerking, resolved.  In the setting of hypercarbia.  Neurology evaluated and agree with most likely related to elevated CO2 due to above. -Continue to monitor, treating hypercarbia with NIPPV  Hyperkalemia, resolved with discontinuation of lisinopril -Have been holding home lisinopril -Monitor BMP  Hyperglycemia, in the setting of IV steroid use.  A1c 6, consistent with type 2 diabetes -Expect improvement with transition off Solu-Medrol today -Add sliding scale, monitor CBGs  HTN, stable -Continue clonidine, Imdur,   CKD stage III, stable Baseline creatinine of 1.4-1.7 -Avoid nephrotoxins,  -continue to closely monitor while diuresing -Daily BMP  Anemia of chronic kidney disease, stable -No signs or symptoms of bleeding  Bedbugs -contact precautions, supportive care  History of gout, stable -continue home allopurinol  History of sleep apnea has not been adherent as patient lost CPAP, continue CPAP at bedtime  Morbid obesity BMI  63.77 -Weight loss/dietary counseling provided during hospital stay.  Chronic pain -Tylenol as needed every 8 hours, Percocet 2 tabs as needed every 6 hours, closely monitor mental status, avoid narcotics given somnolence -Emphasized no outpatient prescriptions will be provided, will need to discuss with primary care physician -Cymbalta -continue bowel regimen with MiraLAX twice daily and senna docusate   Family Communication  : Spouse updated at bedside on 4/1  Code Status : Full  Disposition Plan  :  Patient is from home. Anticipated d/c date:  48 hours. Barriers to d/c or necessity for inpatient status:  Weaning IV steroids today, transition to oral Lasix today.  Anticipate when medically stable on 4/7.  Barriers include obtaining home NIV per TOC team, limited due to patient's late situation (has bedbugs extended-stay hotel--agencies will not come and assess) Consults  : PCCM  Procedures  : 06/20/19, TTE.  EF 60-65%: 3/23 bilateral venous duplex DVTs  DVT Prophylaxis  : Heparin  Lab Results  Component Value Date   PLT 326 06/24/2019    Diet :  Diet Order            Diet regular Room service appropriate? No; Fluid consistency: Thin  Diet effective now               Inpatient Medications Scheduled Meds: . allopurinol  100 mg Oral Daily  . arformoterol  15 mcg Nebulization BID  . benzonatate  100 mg Oral BID  . budesonide (PULMICORT) nebulizer solution  0.25 mg Nebulization BID  . carbamide peroxide  5 drop Both EARS BID  . cloNIDine  0.2 mg Oral BID  . darifenacin  7.5 mg Oral Daily  . DULoxetine  30 mg Oral Daily  . enoxaparin (LOVENOX) injection  90 mg Subcutaneous Q24H  . isosorbide mononitrate  30 mg Oral Daily  . mouth rinse  15 mL Mouth Rinse BID  . methylPREDNISolone (SOLU-MEDROL) injection  40 mg Intravenous Q12H  . nystatin   Topical TID  . omega-3 acid ethyl esters  1 g Oral Daily  . pantoprazole  40 mg Oral Daily  . polyethylene glycol  17 g Oral BID   . senna-docusate  1 tablet Oral BID  . triamcinolone cream   Topical BID  . vitamin E  100 Units Oral Daily   Continuous Infusions: PRN Meds:.acetaminophen **OR** acetaminophen, acetaminophen-codeine, albuterol, ALPRAZolam, bisacodyl, camphor-menthol, guaiFENesin-dextromethorphan, hydrOXYzine, ondansetron (ZOFRAN) IV, oxyCODONE-acetaminophen  Antibiotics  :   Anti-infectives (From admission, onward)   Start     Dose/Rate Route Frequency Ordered Stop   06/21/19 1800  cephALEXin (KEFLEX) capsule 500 mg     500 mg Oral Every 6 hours 06/21/19 1236 06/23/19 1708   06/14/19 2000  cefTRIAXone (ROCEPHIN) 1 g in sodium chloride 0.9 % 100 mL IVPB  Status:  Discontinued     1 g 200 mL/hr over 30 Minutes Intravenous Every 24 hours 06/14/19 0756 06/21/19 1236   06/14/19 0430  vancomycin (VANCOREADY) IVPB 2000 mg/400 mL     2,000 mg 200 mL/hr over 120 Minutes Intravenous  Once 06/14/19 0430 06/14/19 0700   06/14/19 0400  cefTRIAXone (ROCEPHIN) 1 g in sodium chloride 0.9 % 100 mL IVPB     1 g 200 mL/hr over 30 Minutes Intravenous  Once 06/14/19 0346 06/14/19 0450       Objective   Vitals:   06/27/19 1927 06/27/19 2331 06/28/19 0000 06/28/19 0805  BP:      Pulse:  90 82 67  Resp:  [name])  27 17 18   Temp:      TempSrc:      SpO2: 94% 98% 97% 97%  Weight:      Height:        SpO2: 97 % O2 Flow Rate (L/min): 4 L/min FiO2 (%): 97 %  Wt Readings from Last 3 Encounters:  06/27/19 (!) 187 kg  04/14/19 (!) 176.9 kg  12/17/18 (!) 177.8 kg     Intake/Output Summary (Last 24 hours) at 06/28/2019 1554 Last data filed at 06/27/2019 1800 Gross per 24 hour  Intake 360 ml  Output --  Net 360 ml    Physical Exam:     Alert, oriented x 4, no distress No new F.N deficits,  [place].AT, Normal respiratory effort on 3-4 L, decreased breath sounds at bases, scant wheezing in upper airway RRR,No Gallops,Rubs or new Murmurs, no edema Obese abdomen, +ve B.Sounds, Abd Soft, No tenderness, No rebound,  guarding or rigidity. Scant blanching erythema on anterior shin of right leg Scattered abrasions   I have personally reviewed the following:   Data Reviewed:  CBC Recent Labs  Lab 06/22/19 0215 06/24/19 0815  WBC 8.9 9.8  HGB 10.1* 10.6*  HCT 35.8* 37.0  PLT 317 326  MCV 101.1* 102.2*  MCH 28.5 29.3  MCHC 28.2* 28.6*  RDW 14.5 14.6  LYMPHSABS 2.0  --   MONOABS 0.4  --   EOSABS 0.3  --   BASOSABS 0.0  --     Chemistries  Recent Labs  Lab 06/22/19 0215 06/24/19 0815 06/26/19 0742 06/27/19 0307 06/28/19 0155  NA 139 139 141 140 140  K 4.7 5.0 4.7 4.7 4.5  CL 92* 94* 96* 93* 94*  CO2 36* 34* 34* 34* 35*  GLUCOSE 113* 141* 111* 211* 240*  BUN 23* 26* 26* 31* 32*  CREATININE 1.30* 1.38* 1.46* 1.72* 1.51*  CALCIUM 8.8* 9.0 8.6* 8.7* 8.8*  MG 1.8  --   --   --   --   AST 14*  --   --   --   --   ALT 14  --   --   --   --   ALKPHOS 56  --   --   --   --   BILITOT 0.4  --   --   --   --    ------------------------------------------------------------------------------------------------------------------ No results for input(s): CHOL, HDL, LDLCALC, TRIG, CHOLHDL, LDLDIRECT in the last 72 hours.  Lab Results  Component Value Date   HGBA1C 6.0 (H) 06/27/2019   ------------------------------------------------------------------------------------------------------------------ No results for input(s): TSH, T4TOTAL, T3FREE, THYROIDAB in the last 72 hours.  Invalid input(s): FREET3 ------------------------------------------------------------------------------------------------------------------ No results for input(s): VITAMINB12, FOLATE, FERRITIN, TIBC, IRON, RETICCTPCT in the last 72 hours.  Coagulation profile No results for input(s): INR, PROTIME in the last 168 hours.  No results for input(s): DDIMER in the last 72 hours.  Cardiac Enzymes No results for input(s): CKMB, TROPONINI, MYOGLOBIN in the last 168 hours.  Invalid input(s):  CK ------------------------------------------------------------------------------------------------------------------    Component Value Date/Time   BNP 120.9 (H) 06/14/2019 2440    Micro Results No results found for this or any previous visit (from the past 240 hour(s)).  Radiology Reports DG Chest 2 View  Result Date: 06/13/2019 CLINICAL DATA:  Shortness of breath. Patient reports bilateral lower leg pain, redness and swelling. EXAM: CHEST - 2 VIEW COMPARISON:  CT angiogram chest 03/12/2019, chest radiograph 03/12/2019 FINDINGS: Limited evaluation due to patient body habitus and shallow inspiration. Mild cardiomegaly appears  unchanged. Mild ill-defined opacity within the lateral right lung base. No evidence of pleural effusion or pneumothorax. No acute bony abnormality. IMPRESSION: Limited examination as described. Shallow inspiration radiograph. Mild ill-defined opacity within the lateral right lung base which may reflect atelectasis. Pneumonia cannot be excluded. Unchanged mild cardiomegaly. Electronically Signed   By: Kellie Simmering DO   On: 06/13/2019 19:53   DG Tibia/Fibula Left  Result Date: 06/13/2019 CLINICAL DATA:  Bilateral lower leg pain. Redness and swelling. EXAM: LEFT TIBIA AND FIBULA - 2 VIEW COMPARISON:  None. FINDINGS: Cortical margins of the tibia and fibula are intact. No fracture. No bony destruction or periosteal reaction. Moderate osteoarthritis of the knee joint. Mild osteoarthritis of the ankle. Generalized soft tissue edema and soft tissue prominence. No soft tissue air or radiopaque foreign body. Multiple phleboliths in the soft tissues. IMPRESSION: Generalized soft tissue edema.  No acute osseous abnormality. Electronically Signed   By: Keith Rake M.D.   On: 06/13/2019 19:51   DG Tibia/Fibula Right  Result Date: 06/13/2019 CLINICAL DATA:  Bilateral lower leg pain. Redness and swelling. EXAM: RIGHT TIBIA AND FIBULA - 2 VIEW COMPARISON:  None. FINDINGS: Cortical  margins of the tibia and fibula are intact. No fracture. No bony destruction or periosteal reaction. Moderate osteoarthritis of the knee joint. Mild osteoarthritis of the ankle. Generalized soft tissue edema and soft tissue prominence. No soft tissue air or radiopaque foreign body. Multiple phleboliths in the soft tissues. IMPRESSION: Generalized soft tissue edema. No acute osseous abnormality. Electronically Signed   By: Keith Rake M.D.   On: 06/13/2019 19:50   DG Abd 1 View  Result Date: 06/16/2019 CLINICAL DATA:  Abdominal pain with nausea and headaches. EXAM: ABDOMEN - 1 VIEW COMPARISON:  Abdominal radiographs CT abdomen/pelvis 11/28/2011. 12/27/2011 FINDINGS: Air distention of a nondescript bowel loop within the right hemiabdomen. Large colonic stool burden within the right colon and distal colon/rectum. No acute bony abnormality. Thoracic spondylosis. IMPRESSION: Air distention of nondescript bowel loop within the right hemiabdomen, nonspecific. Large stool burden within the right colon and distal colon/rectum. Electronically Signed   By: Kellie Simmering DO   On: 06/16/2019 09:08   CT Angio Chest PE W and/or Wo Contrast  Result Date: 06/14/2019 CLINICAL DATA:  Right leg pain and redness with swelling for 1 week EXAM: CT ANGIOGRAPHY CHEST WITH CONTRAST TECHNIQUE: Multidetector CT imaging of the chest was performed using the standard protocol during bolus administration of intravenous contrast. Multiplanar CT image reconstructions and MIPs were obtained to evaluate the vascular anatomy. CONTRAST:  42mL OMNIPAQUE IOHEXOL 350 MG/ML SOLN COMPARISON:  03/12/2019 FINDINGS: Cardiovascular: Limited opacification of the pulmonary arteries due to body habitus and bolus dispersion. No pulmonary embolism is seen, definitely nondiagnostic beyond the main and lobar level. Coronary calcification. No cardiomegaly or pericardial effusion. Mild atherosclerotic calcification. Mediastinum/Nodes: Negative for adenopathy  or mass. Lungs/Pleura: Linear atelectasis/scarring. There is no edema, consolidation, effusion, or pneumothorax. Symmetric narrowing of the bilateral mainstem bronchi, likely bronchomalacia. Upper Abdomen: No acute finding Musculoskeletal: Spondylosis.  No acute or aggressive finding Review of the MIP images confirms the above findings. IMPRESSION: 1. Severely limited chest CTA due to body habitus and bolus dispersion. No pulmonary embolism is seen at the main or lobar levels. 2.  Aortic Atherosclerosis (ICD10-I70.0).  Coronary atherosclerosis Electronically Signed   By: Monte Fantasia M.D.   On: 06/14/2019 04:08   DG CHEST PORT 1 VIEW  Result Date: 06/25/2019 CLINICAL DATA:  Wheezing and cough EXAM: PORTABLE CHEST 1  VIEW COMPARISON:  06/21/2019 FINDINGS: Cardiomegaly and vascular pedicle widening with vascular congestion. No Kerley lines, effusion, or pneumothorax. There is artifact from EKG leads. IMPRESSION: Cardiomegaly and vascular congestion. Electronically Signed   By: Monte Fantasia M.D.   On: 06/25/2019 11:10   DG CHEST PORT 1 VIEW  Result Date: 06/21/2019 CLINICAL DATA:  Shortness of breath EXAM: PORTABLE CHEST 1 VIEW COMPARISON:  June 20, 2019 FINDINGS: Mild bibasilar atelectasis. No edema or consolidation. There is cardiomegaly with a degree of pulmonary vascular congestion. IMPRESSION: No active disease. Electronically Signed   By: Lowella Grip III M.D.   On: 06/21/2019 08:57   DG CHEST PORT 1 VIEW  Result Date: 06/20/2019 CLINICAL DATA:  Shortness of breath EXAM: PORTABLE CHEST 1 VIEW COMPARISON:  Yesterday FINDINGS: Cardiomegaly and vascular pedicle widening accentuated by rotation. Low volume chest with diffuse vascular congestion. Interstitial prominence without Kerley lines. No definite effusion. IMPRESSION: CHF pattern.  Vascular congestion appears increased from yesterday. Electronically Signed   By: Monte Fantasia M.D.   On: 06/20/2019 07:14   DG CHEST PORT 1 VIEW  Result  Date: 06/19/2019 CLINICAL DATA:  Shortness of breath EXAM: PORTABLE CHEST 1 VIEW COMPARISON:  06/18/2019 FINDINGS: Bilateral interstitial prominence. Lingular airspace disease which may reflect atelectasis versus pneumonia. No pleural effusion or pneumothorax. Stable cardiomediastinal silhouette. No aggressive osseous lesion. IMPRESSION: Lingular airspace disease which may reflect atelectasis versus pneumonia. Cardiomegaly with pulmonary vascular congestion. Electronically Signed   By: Kathreen Devoid   On: 06/19/2019 07:58   DG CHEST PORT 1 VIEW  Result Date: 06/18/2019 CLINICAL DATA:  Shortness of breath. Cellulitis of lower extremity EXAM: PORTABLE CHEST 1 VIEW COMPARISON:  June 13, 2019 FINDINGS: The mediastinal contour is stable. Heart size is enlarged. Increased pulmonary interstitium is identified bilaterally. No focal pneumonia or pleural effusion is identified. The bony structures are stable. IMPRESSION: Congestive heart failure. Electronically Signed   By: Abelardo Diesel M.D.   On: 06/18/2019 11:25   EEG adult  Result Date: 06/17/2019 Lora Havens, MD     06/17/2019  5:12 PM Patient Name: VITALIA STOUGH MRN: [id number] Epilepsy Attending: Lora Havens Referring Physician/Provider: Dr. Osborne Oman Date: 06/17/2019 Duration: 21.55 mins Patient history: 54 year old female with jerking movements in arms.  EEG to evaluate for seizures. Level of alertness: Awake and asleep AEDs during EEG study: None Technical aspects: This EEG study was done with scalp electrodes positioned according to the 10-20 International system of electrode placement. Electrical activity was acquired at a sampling rate of 500Hz  and reviewed with a high frequency filter of 70Hz  and a low frequency filter of 1Hz . EEG data were recorded continuously and digitally stored. Description: The posterior dominant rhythm consists of 8 Hz activity of moderate voltage (25-35 uV) seen predominantly in posterior head regions, symmetric  and reactive to eye opening and eye closing. Sleep was characterized by generalized 4-6hz  theta-delta slowing. Hyperventilation and photic stimulation were not performed. IMPRESSION: This study is within normal limits. No seizures or epileptiform discharges were seen throughout the recording. Lora Havens   ECHOCARDIOGRAM COMPLETE  Result Date: 06/20/2019    ECHOCARDIOGRAM REPORT   Patient Name:   LOVELY KERINS Date of Exam: 06/20/2019 Medical Rec #:  [id number]         Height:       66.0 in Accession #:    [id number]        Weight:       395.1 lb Date of Birth:  Mar 18, 1966          BSA:          2.671 m Patient Age:    74 years          BP:           114/71 mmHg Patient Gender: F                 HR:           76 bpm. Exam Location:  Inpatient Procedure: 2D Echo, Color Doppler and Cardiac Doppler Indications:    R06.9 DOE  History:        Patient has prior history of Echocardiogram examinations, most                 recent 07/09/2017. CHF; Risk Factors:Sleep Apnea, Hypertension                 and Dyslipidemia.  Sonographer:    Raquel Sarna Senior RDCS Referring Phys: [id number] Georgina Quint LATIF [facility]  Sonographer Comments: Technically difficult study with very poor image quality due to morbid obesity and patient being unable to tolerate probe pressure. IMPRESSIONS  1. Technically difficult study with limited views. Grossly normal LV systolic function. Left ventricular ejection fraction, by estimation, is 60 to 65%. Left ventricular diastolic parameters are indeterminate.  2. Right ventricular systolic function was not well visualized. The right ventricular size is not well visualized.  3. The mitral valve was not well visualized. No evidence of mitral valve regurgitation.  4. The aortic valve was not well visualized. Aortic valve regurgitation is not visualized. No aortic stenosis is present.  5. The inferior vena cava is dilated in size with >50% respiratory variability, suggesting right atrial pressure of 8 mmHg.  FINDINGS  Left Ventricle: Left ventricular ejection fraction, by estimation, is 60 to 65%. The left ventricle has normal function. The left ventricle has no regional wall motion abnormalities. The left ventricular internal cavity size was normal in size. There is  Not well visualized left ventricular hypertrophy. Left ventricular diastolic parameters are indeterminate. Right Ventricle: The right ventricular size is not well visualized. Right vetricular wall thickness was not assessed. Right ventricular systolic function was not well visualized. Left Atrium: Left atrial size was not well visualized. Right Atrium: Right atrial size was not well visualized. Pericardium: Trivial pericardial effusion is present. Presence of pericardial fat pad. Mitral Valve: The mitral valve was not well visualized. No evidence of mitral valve regurgitation. Tricuspid Valve: The tricuspid valve is not well visualized. Tricuspid valve regurgitation is not demonstrated. Aortic Valve: The aortic valve was not well visualized. Aortic valve regurgitation is not visualized. No aortic stenosis is present. Pulmonic Valve: The pulmonic valve was not well visualized. Pulmonic valve regurgitation is not visualized. Aorta: The aortic root was not well visualized. Venous: The inferior vena cava is dilated in size with greater than 50% respiratory variability, suggesting right atrial pressure of 8 mmHg. IAS/Shunts: The interatrial septum was not well visualized.  AORTIC VALVE LVOT Vmax:   116.00 cm/s LVOT Vmean:  63.900 cm/s LVOT VTI:    0.209 m  SHUNTS Systemic VTI: 0.21 m Oswaldo Milian MD Electronically signed by Oswaldo Milian MD Signature Date/Time: 06/20/2019/2:30:30 PM    Final    VAS [place] LOWER EXTREMITY VENOUS (DVT)  Result Date: 06/14/2019  Lower Venous DVTStudy Indications: Edema.  Limitations: Body habitus and poor ultrasound/tissue interface. Comparison Study: 07/07/17 previous Performing Technologist: Abram Sander RVS   Examination Guidelines: A complete evaluation includes  B-mode imaging, spectral Doppler, color Doppler, and power Doppler as needed of all accessible portions of each vessel. Bilateral testing is considered an integral part of a complete examination. Limited examinations for reoccurring indications may be performed as noted. The reflux portion of the exam is performed with the patient in reverse Trendelenburg.  +---------+---------------+---------+-----------+----------+--------------+ RIGHT    CompressibilityPhasicitySpontaneityPropertiesThrombus Aging +---------+---------------+---------+-----------+----------+--------------+ CFV      Full           Yes      Yes                                 +---------+---------------+---------+-----------+----------+--------------+ SFJ      Full                                                        +---------+---------------+---------+-----------+----------+--------------+ FV Prox  Full                                                        +---------+---------------+---------+-----------+----------+--------------+ FV Mid   Full                                                        +---------+---------------+---------+-----------+----------+--------------+ FV DistalFull                                                        +---------+---------------+---------+-----------+----------+--------------+ PFV      Full                                                        +---------+---------------+---------+-----------+----------+--------------+ POP      Full           Yes      Yes                                 +---------+---------------+---------+-----------+----------+--------------+ PTV                                                   Not visualized +---------+---------------+---------+-----------+----------+--------------+ PERO                                                  Not visualized  +---------+---------------+---------+-----------+----------+--------------+   +---------+---------------+---------+-----------+----------+--------------+ LEFT     CompressibilityPhasicitySpontaneityPropertiesThrombus Aging +---------+---------------+---------+-----------+----------+--------------+  CFV      Full           Yes      Yes                                 +---------+---------------+---------+-----------+----------+--------------+ SFJ      Full                                                        +---------+---------------+---------+-----------+----------+--------------+ FV Prox  Full                                                        +---------+---------------+---------+-----------+----------+--------------+ FV Mid   Full                                                        +---------+---------------+---------+-----------+----------+--------------+ FV DistalFull                                                        +---------+---------------+---------+-----------+----------+--------------+ PFV      Full                                                        +---------+---------------+---------+-----------+----------+--------------+ POP      Full           Yes      Yes                                 +---------+---------------+---------+-----------+----------+--------------+ PTV      Full                                                        +---------+---------------+---------+-----------+----------+--------------+ PERO                                                  Not visualized +---------+---------------+---------+-----------+----------+--------------+     Summary: BILATERAL: - No evidence of deep vein thrombosis seen in the lower extremities, bilaterally.   *See table(s) above for measurements and observations. Electronically signed by Deitra Mayo MD on 06/14/2019 at 1:09:05 PM.    Final      Time Spent in  minutes  30     Desiree Hane M.D on 06/28/2019  at 3:54 PM  To page go to [email or web address] - password [facility], [place]

## 2019-06-28 NOTE — Progress Notes (Signed)
Spoke with pt this morning.  Explained that CM is trying to arrange for home care and [facility].  Seems that options are limited due to her home environment and bed bugs.  No additional pulmonary interventions at this time.    PCCM will s/o for now.  Call for assistance when she is closer to discharge, and PCCM will then assist with settings for [facility] home vent.  Chesley Mires, MD [facility] Pulmonary/Critical Care 06/28/2019, 9:04 AM

## 2019-06-28 NOTE — Plan of Care (Signed)
  Problem: Health Behavior/Discharge Planning: Goal: Ability to manage health-related needs will improve Outcome: Progressing   Problem: Clinical Measurements: Goal: Will remain free from infection Outcome: Progressing Goal: Diagnostic test results will improve Outcome: Progressing Goal: Respiratory complications will improve Outcome: Progressing Goal: Cardiovascular complication will be avoided Outcome: Progressing   Problem: Activity: Goal: Risk for activity intolerance will decrease Outcome: Progressing   Problem: Nutrition: Goal: Adequate nutrition will be maintained Outcome: Progressing   

## 2019-06-28 NOTE — Progress Notes (Signed)
CM received call from Liaison Caryl Pina) who states that his agency will not be able to provide [facility] home vent due to health concerns and other concerns. CM has now made referrals to [facility], [facility] and [facility] who have all declined.  MD to be updated. Will continue to follow.

## 2019-06-29 DIAGNOSIS — G4733 Obstructive sleep apnea (adult) (pediatric): Secondary | ICD-10-CM

## 2019-06-29 DIAGNOSIS — E662 Morbid (severe) obesity with alveolar hypoventilation: Secondary | ICD-10-CM

## 2019-06-29 DIAGNOSIS — J9612 Chronic respiratory failure with hypercapnia: Secondary | ICD-10-CM

## 2019-06-29 LAB — BASIC METABOLIC PANEL
Anion gap: 10 (ref 5–15)
BUN: 34 mg/dL — ABNORMAL HIGH (ref 6–20)
CO2: 37 mmol/L — ABNORMAL HIGH (ref 22–32)
Calcium: 8.6 mg/dL — ABNORMAL LOW (ref 8.9–10.3)
Chloride: 93 mmol/L — ABNORMAL LOW (ref 98–111)
Creatinine, Ser: 1.41 mg/dL — ABNORMAL HIGH (ref 0.44–1.00)
GFR calc Af Amer: 48 mL/min — ABNORMAL LOW (ref >60)
GFR calc non Af Amer: 42 mL/min — ABNORMAL LOW (ref >60)
Glucose, Bld: 141 mg/dL — ABNORMAL HIGH (ref 70–99)
Potassium: 5.4 mmol/L — ABNORMAL HIGH (ref 3.5–5.1)
Sodium: 140 mmol/L (ref 135–145)

## 2019-06-29 LAB — GLUCOSE, CAPILLARY
Glucose-Capillary: 90 mg/dL (ref 70–99)
Glucose-Capillary: 122 mg/dL — ABNORMAL HIGH (ref 70–99)
Glucose-Capillary: 118 mg/dL — ABNORMAL HIGH (ref 70–99)
Glucose-Capillary: 135 mg/dL — ABNORMAL HIGH (ref 70–99)

## 2019-06-29 MED ORDER — BUDESONIDE 0.25 MG/2ML IN SUSP: 0.2500 mg | Freq: Two times a day (BID) | RESPIRATORY_TRACT | 12 refills | Status: AC

## 2019-06-29 MED ORDER — PANTOPRAZOLE SODIUM 40 MG PO TBEC
40.0000 mg | DELAYED_RELEASE_TABLET | Freq: Two times a day (BID) | ORAL | Status: DC
  Administered 2019-06-29 – 2019-06-30 (×2): 40 mg via ORAL
  Filled 2019-06-29 (×2): qty 1

## 2019-06-29 MED ORDER — PANTOPRAZOLE SODIUM 40 MG PO TBEC: 40.0000 mg | DELAYED_RELEASE_TABLET | Freq: Two times a day (BID) | ORAL | 1 refills | Status: DC

## 2019-06-29 MED ORDER — ARFORMOTEROL TARTRATE 15 MCG/2ML IN NEBU: 15.0000 ug | INHALATION_SOLUTION | Freq: Two times a day (BID) | RESPIRATORY_TRACT | 1 refills | Status: AC

## 2019-06-29 MED ORDER — FUROSEMIDE 40 MG PO TABS: 40.0000 mg | ORAL_TABLET | Freq: Every day | ORAL | 3 refills | Status: AC

## 2019-06-29 MED ORDER — POLYETHYLENE GLYCOL 3350 17 G PO PACK: 17.0000 g | PACK | Freq: Every day | ORAL | 0 refills | Status: AC | PRN

## 2019-06-29 MED ORDER — BENZONATATE 100 MG PO CAPS: 100.0000 mg | ORAL_CAPSULE | Freq: Two times a day (BID) | ORAL | 0 refills | Status: DC

## 2019-06-29 MED ORDER — SENNOSIDES-DOCUSATE SODIUM 8.6-50 MG PO TABS: 1.0000 | ORAL_TABLET | Freq: Every evening | ORAL | 1 refills | Status: AC | PRN

## 2019-06-29 MED ORDER — GUAIFENESIN-DM 100-10 MG/5ML PO SYRP: 5.0000 mL | ORAL_SOLUTION | ORAL | 0 refills | Status: AC | PRN

## 2019-06-29 NOTE — Discharge Summary (Signed)
Physician Discharge Summary  Lastacia Solum Henson KTG:[id number] DOB: 06-24-1965 DOA: 06/13/2019  PCP: Ladell Pier, MD  Admit date: 06/13/2019 Discharge date: 06/30/2019  Admitted From: Home  Disposition: Home   Recommendations for Outpatient Follow-up:  1. Follow up with PCP in 1-2 weeks 2. Please obtain BMP/CBC in one week Please follow up with PCCM as recommended.   Discharge Condition: stable.  CODE STATUS: full code Diet recommendation: Heart Healthy   Brief/Interim Summary: Kara Vincent is a 54 y.o. year old female with medical history significant for morbid obesity, chronic pain/anxiety, chronic hypoxic/hypercarbic respiratory failure on 2 L nasal cannula, obesity hypoventilation syndrome who presented on 06/13/2019 with 1 week of bilateral leg pain and swelling, and shortness of breath and was admitted with working diagnosis of lower extremity cellulitis and panniculitis. : Chest x-ray with concern for atelectasis versus pneumonia, given shortness of breath CTA was obtained which was negative for any PE and Dopplers negative for DVT.  Course complicated by worsening CO2 retention resulting in decreased mental status and worsening respiratory function.  With initiation of NIPPV (patient had episodes of adherence) patient clinically improved.  PCCM recommends continuing BiPAP for long-term use as outpatient in setting of OSA/OHS and chronic O2 needs.  Arrangement of NIV at home complicated by patient's home disposition--due to bed bugs in home agencies are declining to assess. Her course was complicated by mild CHF exacerbation as evident by worsening wheezing and dyspnea with vascular congestion onCXR that required IV lasix diuresis in addition to scheduled inhalers and IV solumedrol. Transitioning to oral lasix on 4/6.  Discharge Diagnoses:  Principal Problem:   Cellulitis of both lower extremities Active Problems:   Asthma exacerbation   Acute on chronic respiratory failure with  hypoxia and hypercapnia (HCC)   CKD (chronic kidney disease) stage 3, GFR 30-59 ml/min (HCC)   OSA (obstructive sleep apnea)   Essential hypertension   Gout   Diastolic CHF, acute on chronic (HCC)   BMI 60.0-69.9, adult (HCC)   Acute respiratory failure with hypoxia (HCC)   Cellulitis  Acute on chronic hypoxic and hypercarbic respiratory failure, multifactorial etiology related to OSA, obesity hypoventilation syndrome/asthma, stable.   O2 status at baseline 4L, wheezing scant. Patient  responding well to NIPPV in hospital.   -Patient greatly benefits from BiPAP, PCCM recommends continue long-term management -referral for home BiPAP, the equipment to be delivered today.  --IS, flutter valve, tessalon pears, schedule mucinex  continue with Pulmicort, Brovana on discharge.  --once able to get home NIV will need to touch base with PCCM to get appropriate vent settings  CHF with preserved EF, with exacerbation, improving TTE on 4287 showed diastolic dysfunction.  Repeat TTE here shows preserved EF.  Net -3.1L since hospital stay weights unreliable in charting. Resume oral lasix on discharge.  Bilateral lower extremity cellulitis/panniculitis,resolved. Completed 10 day course (ceftriaxone 3/22-3/29, Keflex 3/30-4/1) -Remains afebrile, rash improving   Myoclonic jerking, resolved.  In the setting of hypercarbia.  Neurology evaluated and agree with most likely related to elevated CO2 due to above. -Continue to monitor, treating hypercarbia with NIPPV  Hyperkalemia, resolved with discontinuation of lisinopril Hold lisinopril on discharge.   Hyperglycemia, in the setting of IV steroid use.  A1c 6, consistent with type 2 diabetes CBG (last 3)  Recent Labs    06/28/19 2133 06/29/19 0815 06/29/19 1159  GLUCAP 185* 90 122*     HTN, stable -Continue clonidine, Imdur,   CKD stage III, stable Baseline creatinine of 1.4-1.7 -Avoid  nephrotoxins,    Anemia of chronic kidney  disease, stable -No signs or symptoms of bleeding  Bedbugs -contact precautions, supportive care  History of gout, stable -continue home allopurinol  History of sleep apnea has not been adherent as patient lost CPAP, continue CPAP at bedtime  Morbid obesity BMI 63.77 -Weight loss/dietary counseling provided during hospital stay.  Chronic pain -Tylenol as needed every 8 hours, Percocet 2 tabs as needed every 6 hours, closely monitor mental status, avoid narcotics given somnolence -Emphasized no outpatient prescriptions will be provided, will need to discuss with primary care physician -Cymbalta -continue bowel regimen with MiraLAX twice daily and senna docusate    Discharge Instructions   Allergies as of 06/29/2019      Reactions   Ibuprofen Hives, Shortness Of Breath, Palpitations, Rash   Has tolerated toradol 12/2013 inpatient   Penicillins Anaphylaxis, Hives, Rash   Has patient had a PCN reaction causing immediate rash, facial/tongue/throat swelling, SOB or lightheadedness with hypotension: Yes Has patient had a PCN reaction causing severe rash involving mucus membranes or skin necrosis: Yes Has patient had a PCN reaction that required hospitalization Unknown Has patient had a PCN reaction occurring within the last 10 years: Unknown If all of the above answers are "NO", then may proceed with Cephalosporin use.   Sulfa Antibiotics Hives, Shortness Of Breath, Rash, Cough   Adhesive [tape] Rash   Doxycycline Hives, Swelling, Rash   Sulfamethoxazole-trimethoprim Rash   Zithromax [azithromycin] Hives, Swelling, Rash      Medication List    STOP taking these medications   atorvastatin 20 MG tablet Commonly known as: LIPITOR   bacitracin ointment   ketoconazole 2 % shampoo Commonly known as: Nizoral   lisinopril 40 MG tablet Commonly known as: ZESTRIL     TAKE these medications   acetaminophen-codeine 300-30 MG tablet Commonly known as: TYLENOL #3 Take 1  tablet by mouth every 8 (eight) hours as needed for moderate pain. Each prescription to last one month   albuterol 108 (90 Base) MCG/ACT inhaler Commonly known as: VENTOLIN HFA Inhale 1-2 puffs into the lungs every 6 (six) hours as needed for wheezing or shortness of breath.   allopurinol 100 MG tablet Commonly known as: ZYLOPRIM Take 1 tablet (100 mg total) by mouth every morning. What changed: when to take this   arformoterol 15 MCG/2ML Nebu Commonly known as: BROVANA Take 2 mLs (15 mcg total) by nebulization 2 (two) times daily.   benzonatate 100 MG capsule Commonly known as: TESSALON Take 1 capsule (100 mg total) by mouth 2 (two) times daily.   budesonide 0.25 MG/2ML nebulizer solution Commonly known as: PULMICORT Take 2 mLs (0.25 mg total) by nebulization 2 (two) times daily.   cholecalciferol 25 MCG (1000 UNIT) tablet Commonly known as: VITAMIN D3 Take 1,000 Units by mouth daily.   cloNIDine 0.2 MG tablet Commonly known as: CATAPRES Take 1 tablet (0.2 mg total) by mouth 2 (two) times daily.   [employer] TRIPLE ACTION PO Take 1 tablet by mouth daily.   DULoxetine 30 MG capsule Commonly known as: Cymbalta Take 1 capsule (30 mg total) by mouth daily.   FISH OIL PO Take 1 capsule by mouth daily.   furosemide 40 MG tablet Commonly known as: LASIX Take 1 tablet (40 mg total) by mouth daily. 1 tab po daily   guaiFENesin-dextromethorphan 100-10 MG/5ML syrup Commonly known as: ROBITUSSIN DM Take 5 mLs by mouth every 4 (four) hours as needed for cough.   hydrOXYzine  50 MG tablet Commonly known as: ATARAX/VISTARIL TAKE ONE TABLET BY MOUTH THREE TIMES DAILY AS NEEDED FOR ANXIETY OR FOR ITCHING What changed: See the new instructions.   ipratropium-albuterol 0.5-2.5 (3) MG/3ML Soln Commonly known as: DUONEB Take 3 mLs by nebulization every 6 (six) hours as needed (SOB).   isosorbide mononitrate 30 MG 24 hr tablet Commonly known as: IMDUR Take 1 tablet (30 mg  total) by mouth daily.   Misc. Devices Misc Please provide patient with a raised toilet seat that is covered under her current insurance plan.   Misc. Devices Misc Heavy duty shower chair; Heavy duty potty chair. Dx: Osteoarthritis   nystatin powder Commonly known as: MYCOSTATIN/NYSTOP Apply topically 4 (four) times daily. Apply beneath breast and below abdominal fold   pantoprazole 40 MG tablet Commonly known as: PROTONIX Take 1 tablet (40 mg total) by mouth 2 (two) times daily before a meal. What changed: when to take this   polyethylene glycol 17 g packet Commonly known as: MIRALAX / GLYCOLAX Take 17 g by mouth daily as needed.   senna-docusate 8.6-50 MG tablet Commonly known as: Senokot-S Take 1 tablet by mouth at bedtime as needed for mild constipation.   triamcinolone cream 0.1 % Commonly known as: KENALOG APPLY topically TO THE RASH ON your back TWICE DAILY What changed:   how much to take  how to take this  when to take this  additional instructions   VESIcare 5 MG tablet Generic drug: solifenacin TAKE ONE TABLET BY MOUTH EVERY MORNING What changed:   how much to take  when to take this   VITAMIN E PO Take 1 tablet by mouth daily.            Durable Medical Equipment  (From admission, onward)         Start     Ordered   06/22/19 1254  For home use only DME Bedside commode  Once    Comments: Bariatric - weight 406lbs  Question:  Patient needs a bedside commode to treat with the following condition  Answer:  Weakness   06/22/19 1254         Follow-up Information    Ladell Pier, MD. Schedule an appointment as soon as possible for a visit in 1 week(s).   Specialty: Internal Medicine Contact information: [address]  [place] [address] [phone]          Allergies  Allergen Reactions  . Ibuprofen Hives, Shortness Of Breath, Palpitations and Rash    Has tolerated toradol 12/2013 inpatient  . Penicillins  Anaphylaxis, Hives and Rash    Has patient had a PCN reaction causing immediate rash, facial/tongue/throat swelling, SOB or lightheadedness with hypotension: Yes Has patient had a PCN reaction causing severe rash involving mucus membranes or skin necrosis: Yes Has patient had a PCN reaction that required hospitalization Unknown Has patient had a PCN reaction occurring within the last 10 years: Unknown If all of the above answers are "NO", then may proceed with Cephalosporin use.   . Sulfa Antibiotics Hives, Shortness Of Breath, Rash and Cough  . Adhesive [Tape] Rash  . Doxycycline Hives, Swelling and Rash  . Sulfamethoxazole-Trimethoprim Rash  . Zithromax [Azithromycin] Hives, Swelling and Rash    Consultations:  PCCM.    Procedures/Studies: DG Chest 2 View  Result Date: 06/13/2019 CLINICAL DATA:  Shortness of breath. Patient reports bilateral lower leg pain, redness and swelling. EXAM: CHEST - 2 VIEW COMPARISON:  CT angiogram chest 03/12/2019, chest  radiograph 03/12/2019 FINDINGS: Limited evaluation due to patient body habitus and shallow inspiration. Mild cardiomegaly appears unchanged. Mild ill-defined opacity within the lateral right lung base. No evidence of pleural effusion or pneumothorax. No acute bony abnormality. IMPRESSION: Limited examination as described. Shallow inspiration radiograph. Mild ill-defined opacity within the lateral right lung base which may reflect atelectasis. Pneumonia cannot be excluded. Unchanged mild cardiomegaly. Electronically Signed   By: Kellie Simmering DO   On: 06/13/2019 19:53   DG Tibia/Fibula Left  Result Date: 06/13/2019 CLINICAL DATA:  Bilateral lower leg pain. Redness and swelling. EXAM: LEFT TIBIA AND FIBULA - 2 VIEW COMPARISON:  None. FINDINGS: Cortical margins of the tibia and fibula are intact. No fracture. No bony destruction or periosteal reaction. Moderate osteoarthritis of the knee joint. Mild osteoarthritis of the ankle. Generalized soft  tissue edema and soft tissue prominence. No soft tissue air or radiopaque foreign body. Multiple phleboliths in the soft tissues. IMPRESSION: Generalized soft tissue edema.  No acute osseous abnormality. Electronically Signed   By: Keith Rake M.D.   On: 06/13/2019 19:51   DG Tibia/Fibula Right  Result Date: 06/13/2019 CLINICAL DATA:  Bilateral lower leg pain. Redness and swelling. EXAM: RIGHT TIBIA AND FIBULA - 2 VIEW COMPARISON:  None. FINDINGS: Cortical margins of the tibia and fibula are intact. No fracture. No bony destruction or periosteal reaction. Moderate osteoarthritis of the knee joint. Mild osteoarthritis of the ankle. Generalized soft tissue edema and soft tissue prominence. No soft tissue air or radiopaque foreign body. Multiple phleboliths in the soft tissues. IMPRESSION: Generalized soft tissue edema. No acute osseous abnormality. Electronically Signed   By: Keith Rake M.D.   On: 06/13/2019 19:50   DG Abd 1 View  Result Date: 06/16/2019 CLINICAL DATA:  Abdominal pain with nausea and headaches. EXAM: ABDOMEN - 1 VIEW COMPARISON:  Abdominal radiographs CT abdomen/pelvis 11/28/2011. 12/27/2011 FINDINGS: Air distention of a nondescript bowel loop within the right hemiabdomen. Large colonic stool burden within the right colon and distal colon/rectum. No acute bony abnormality. Thoracic spondylosis. IMPRESSION: Air distention of nondescript bowel loop within the right hemiabdomen, nonspecific. Large stool burden within the right colon and distal colon/rectum. Electronically Signed   By: Kellie Simmering DO   On: 06/16/2019 09:08   CT Angio Chest PE W and/or Wo Contrast  Result Date: 06/14/2019 CLINICAL DATA:  Right leg pain and redness with swelling for 1 week EXAM: CT ANGIOGRAPHY CHEST WITH CONTRAST TECHNIQUE: Multidetector CT imaging of the chest was performed using the standard protocol during bolus administration of intravenous contrast. Multiplanar CT image reconstructions and MIPs  were obtained to evaluate the vascular anatomy. CONTRAST:  17mL OMNIPAQUE IOHEXOL 350 MG/ML SOLN COMPARISON:  03/12/2019 FINDINGS: Cardiovascular: Limited opacification of the pulmonary arteries due to body habitus and bolus dispersion. No pulmonary embolism is seen, definitely nondiagnostic beyond the main and lobar level. Coronary calcification. No cardiomegaly or pericardial effusion. Mild atherosclerotic calcification. Mediastinum/Nodes: Negative for adenopathy or mass. Lungs/Pleura: Linear atelectasis/scarring. There is no edema, consolidation, effusion, or pneumothorax. Symmetric narrowing of the bilateral mainstem bronchi, likely bronchomalacia. Upper Abdomen: No acute finding Musculoskeletal: Spondylosis.  No acute or aggressive finding Review of the MIP images confirms the above findings. IMPRESSION: 1. Severely limited chest CTA due to body habitus and bolus dispersion. No pulmonary embolism is seen at the main or lobar levels. 2.  Aortic Atherosclerosis (ICD10-I70.0).  Coronary atherosclerosis Electronically Signed   By: Monte Fantasia M.D.   On: 06/14/2019 04:08   DG CHEST PORT  1 VIEW  Result Date: 06/25/2019 CLINICAL DATA:  Wheezing and cough EXAM: PORTABLE CHEST 1 VIEW COMPARISON:  06/21/2019 FINDINGS: Cardiomegaly and vascular pedicle widening with vascular congestion. No Kerley lines, effusion, or pneumothorax. There is artifact from EKG leads. IMPRESSION: Cardiomegaly and vascular congestion. Electronically Signed   By: Monte Fantasia M.D.   On: 06/25/2019 11:10   DG CHEST PORT 1 VIEW  Result Date: 06/21/2019 CLINICAL DATA:  Shortness of breath EXAM: PORTABLE CHEST 1 VIEW COMPARISON:  June 20, 2019 FINDINGS: Mild bibasilar atelectasis. No edema or consolidation. There is cardiomegaly with a degree of pulmonary vascular congestion. IMPRESSION: No active disease. Electronically Signed   By: Lowella Grip III M.D.   On: 06/21/2019 08:57   DG CHEST PORT 1 VIEW  Result Date:  06/20/2019 CLINICAL DATA:  Shortness of breath EXAM: PORTABLE CHEST 1 VIEW COMPARISON:  Yesterday FINDINGS: Cardiomegaly and vascular pedicle widening accentuated by rotation. Low volume chest with diffuse vascular congestion. Interstitial prominence without Kerley lines. No definite effusion. IMPRESSION: CHF pattern.  Vascular congestion appears increased from yesterday. Electronically Signed   By: Monte Fantasia M.D.   On: 06/20/2019 07:14   DG CHEST PORT 1 VIEW  Result Date: 06/19/2019 CLINICAL DATA:  Shortness of breath EXAM: PORTABLE CHEST 1 VIEW COMPARISON:  06/18/2019 FINDINGS: Bilateral interstitial prominence. Lingular airspace disease which may reflect atelectasis versus pneumonia. No pleural effusion or pneumothorax. Stable cardiomediastinal silhouette. No aggressive osseous lesion. IMPRESSION: Lingular airspace disease which may reflect atelectasis versus pneumonia. Cardiomegaly with pulmonary vascular congestion. Electronically Signed   By: Kathreen Devoid   On: 06/19/2019 07:58   DG CHEST PORT 1 VIEW  Result Date: 06/18/2019 CLINICAL DATA:  Shortness of breath. Cellulitis of lower extremity EXAM: PORTABLE CHEST 1 VIEW COMPARISON:  June 13, 2019 FINDINGS: The mediastinal contour is stable. Heart size is enlarged. Increased pulmonary interstitium is identified bilaterally. No focal pneumonia or pleural effusion is identified. The bony structures are stable. IMPRESSION: Congestive heart failure. Electronically Signed   By: Abelardo Diesel M.D.   On: 06/18/2019 11:25   EEG adult  Result Date: 06/17/2019 Lora Havens, MD     06/17/2019  5:12 PM Patient Name: LUNA AUDIA MRN: [id number] Epilepsy Attending: Lora Havens Referring Physician/Provider: Dr. Osborne Oman Date: 06/17/2019 Duration: 21.55 mins Patient history: 54 year old female with jerking movements in arms.  EEG to evaluate for seizures. Level of alertness: Awake and asleep AEDs during EEG study: None Technical aspects: This  EEG study was done with scalp electrodes positioned according to the 10-20 International system of electrode placement. Electrical activity was acquired at a sampling rate of 500Hz  and reviewed with a high frequency filter of 70Hz  and a low frequency filter of 1Hz . EEG data were recorded continuously and digitally stored. Description: The posterior dominant rhythm consists of 8 Hz activity of moderate voltage (25-35 uV) seen predominantly in posterior head regions, symmetric and reactive to eye opening and eye closing. Sleep was characterized by generalized 4-6hz  theta-delta slowing. Hyperventilation and photic stimulation were not performed. IMPRESSION: This study is within normal limits. No seizures or epileptiform discharges were seen throughout the recording. Lora Havens   ECHOCARDIOGRAM COMPLETE  Result Date: 06/20/2019    ECHOCARDIOGRAM REPORT   Patient Name:   KELE WITHEM Date of Exam: 06/20/2019 Medical Rec #:  [id number]         Height:       66.0 in Accession #:    [id number]  Weight:       395.1 lb Date of Birth:  Mar 29, 1965          BSA:          2.671 m Patient Age:    54 years          BP:           114/71 mmHg Patient Gender: F                 HR:           76 bpm. Exam Location:  Inpatient Procedure: 2D Echo, Color Doppler and Cardiac Doppler Indications:    R06.9 DOE  History:        Patient has prior history of Echocardiogram examinations, most                 recent 07/09/2017. CHF; Risk Factors:Sleep Apnea, Hypertension                 and Dyslipidemia.  Sonographer:    Raquel Sarna Senior RDCS Referring Phys: [id number] Georgina Quint LATIF [facility]  Sonographer Comments: Technically difficult study with very poor image quality due to morbid obesity and patient being unable to tolerate probe pressure. IMPRESSIONS  1. Technically difficult study with limited views. Grossly normal LV systolic function. Left ventricular ejection fraction, by estimation, is 60 to 65%. Left ventricular diastolic  parameters are indeterminate.  2. Right ventricular systolic function was not well visualized. The right ventricular size is not well visualized.  3. The mitral valve was not well visualized. No evidence of mitral valve regurgitation.  4. The aortic valve was not well visualized. Aortic valve regurgitation is not visualized. No aortic stenosis is present.  5. The inferior vena cava is dilated in size with >50% respiratory variability, suggesting right atrial pressure of 8 mmHg. FINDINGS  Left Ventricle: Left ventricular ejection fraction, by estimation, is 60 to 65%. The left ventricle has normal function. The left ventricle has no regional wall motion abnormalities. The left ventricular internal cavity size was normal in size. There is  Not well visualized left ventricular hypertrophy. Left ventricular diastolic parameters are indeterminate. Right Ventricle: The right ventricular size is not well visualized. Right vetricular wall thickness was not assessed. Right ventricular systolic function was not well visualized. Left Atrium: Left atrial size was not well visualized. Right Atrium: Right atrial size was not well visualized. Pericardium: Trivial pericardial effusion is present. Presence of pericardial fat pad. Mitral Valve: The mitral valve was not well visualized. No evidence of mitral valve regurgitation. Tricuspid Valve: The tricuspid valve is not well visualized. Tricuspid valve regurgitation is not demonstrated. Aortic Valve: The aortic valve was not well visualized. Aortic valve regurgitation is not visualized. No aortic stenosis is present. Pulmonic Valve: The pulmonic valve was not well visualized. Pulmonic valve regurgitation is not visualized. Aorta: The aortic root was not well visualized. Venous: The inferior vena cava is dilated in size with greater than 50% respiratory variability, suggesting right atrial pressure of 8 mmHg. IAS/Shunts: The interatrial septum was not well visualized.  AORTIC VALVE  LVOT Vmax:   116.00 cm/s LVOT Vmean:  63.900 cm/s LVOT VTI:    0.209 m  SHUNTS Systemic VTI: 0.21 m Oswaldo Milian MD Electronically signed by Oswaldo Milian MD Signature Date/Time: 06/20/2019/2:30:30 PM    Final    VAS [place] LOWER EXTREMITY VENOUS (DVT)  Result Date: 06/14/2019  Lower Venous DVTStudy Indications: Edema.  Limitations: Body habitus and poor ultrasound/tissue interface. Comparison Study: 07/07/17  previous Performing Technologist: Abram Sander RVS  Examination Guidelines: A complete evaluation includes B-mode imaging, spectral Doppler, color Doppler, and power Doppler as needed of all accessible portions of each vessel. Bilateral testing is considered an integral part of a complete examination. Limited examinations for reoccurring indications may be performed as noted. The reflux portion of the exam is performed with the patient in reverse Trendelenburg.  +---------+---------------+---------+-----------+----------+--------------+ RIGHT    CompressibilityPhasicitySpontaneityPropertiesThrombus Aging +---------+---------------+---------+-----------+----------+--------------+ CFV      Full           Yes      Yes                                 +---------+---------------+---------+-----------+----------+--------------+ SFJ      Full                                                        +---------+---------------+---------+-----------+----------+--------------+ FV Prox  Full                                                        +---------+---------------+---------+-----------+----------+--------------+ FV Mid   Full                                                        +---------+---------------+---------+-----------+----------+--------------+ FV DistalFull                                                        +---------+---------------+---------+-----------+----------+--------------+ PFV      Full                                                         +---------+---------------+---------+-----------+----------+--------------+ POP      Full           Yes      Yes                                 +---------+---------------+---------+-----------+----------+--------------+ PTV                                                   Not visualized +---------+---------------+---------+-----------+----------+--------------+ PERO                                                  Not  visualized +---------+---------------+---------+-----------+----------+--------------+   +---------+---------------+---------+-----------+----------+--------------+ LEFT     CompressibilityPhasicitySpontaneityPropertiesThrombus Aging +---------+---------------+---------+-----------+----------+--------------+ CFV      Full           Yes      Yes                                 +---------+---------------+---------+-----------+----------+--------------+ SFJ      Full                                                        +---------+---------------+---------+-----------+----------+--------------+ FV Prox  Full                                                        +---------+---------------+---------+-----------+----------+--------------+ FV Mid   Full                                                        +---------+---------------+---------+-----------+----------+--------------+ FV DistalFull                                                        +---------+---------------+---------+-----------+----------+--------------+ PFV      Full                                                        +---------+---------------+---------+-----------+----------+--------------+ POP      Full           Yes      Yes                                 +---------+---------------+---------+-----------+----------+--------------+ PTV      Full                                                         +---------+---------------+---------+-----------+----------+--------------+ PERO                                                  Not visualized +---------+---------------+---------+-----------+----------+--------------+     Summary: BILATERAL: - No evidence of deep vein thrombosis seen in the lower extremities, bilaterally.   *See table(s) above for measurements and observations. Electronically signed by Deitra Mayo MD on 06/14/2019 at 1:09:05 PM.    Final        Subjective:  No new complaints.   Discharge Exam: Vitals:   06/29/19 0818 06/29/19 1143  BP:  (!) 133/95  Pulse: 66 79  Resp: 16 18  Temp:  97.7 F (36.5 C)  SpO2: 97% 100%   Vitals:   06/29/19 0000 06/29/19 0100 06/29/19 0818 06/29/19 1143  BP: 133/75   (!) 133/95  Pulse: 73 70 66 79  Resp: 14 16 16 18   Temp:    97.7 F (36.5 C)  TempSrc:    Oral  SpO2: 98% 95% 97% 100%  Weight:      Height:        General: Pt is alert, awake, not in acute distress Cardiovascular: RRR, S1/S2 +, no rubs, no gallops Respiratory: CTA bilaterally, no wheezing, no rhonchi Abdominal: Soft, NT, ND, bowel sounds + Extremities: no cyanosis    The results of significant diagnostics from this hospitalization (including imaging, microbiology, ancillary and laboratory) are listed below for reference.     Microbiology: No results found for this or any previous visit (from the past 240 hour(s)).   Labs: BNP (last 3 results) Recent Labs    01/24/19 1646 03/12/19 0151 06/14/19 0212  BNP 35.7 51.2 009.3*   Basic Metabolic Panel: Recent Labs  Lab 06/24/19 0815 06/26/19 0742 06/27/19 0307 06/28/19 0155 06/29/19 0236  NA 139 141 140 140 140  K 5.0 4.7 4.7 4.5 5.4*  CL 94* 96* 93* 94* 93*  CO2 34* 34* 34* 35* 37*  GLUCOSE 141* 111* 211* 240* 141*  BUN 26* 26* 31* 32* 34*  CREATININE 1.38* 1.46* 1.72* 1.51* 1.41*  CALCIUM 9.0 8.6* 8.7* 8.8* 8.6*   Liver Function Tests: No results for input(s): AST, ALT,  ALKPHOS, BILITOT, PROT, ALBUMIN in the last 168 hours. No results for input(s): LIPASE, AMYLASE in the last 168 hours. No results for input(s): AMMONIA in the last 168 hours. CBC: Recent Labs  Lab 06/24/19 0815  WBC 9.8  HGB 10.6*  HCT 37.0  MCV 102.2*  PLT 326   Cardiac Enzymes: No results for input(s): CKTOTAL, CKMB, CKMBINDEX, TROPONINI in the last 168 hours. BNP: Invalid input(s): POCBNP CBG: Recent Labs  Lab 06/28/19 2133 06/29/19 0815 06/29/19 1159  GLUCAP 185* 90 122*   D-Dimer No results for input(s): DDIMER in the last 72 hours. Hgb A1c Recent Labs    06/27/19 0307  HGBA1C 6.0*   Lipid Profile No results for input(s): CHOL, HDL, LDLCALC, TRIG, CHOLHDL, LDLDIRECT in the last 72 hours. Thyroid function studies No results for input(s): TSH, T4TOTAL, T3FREE, THYROIDAB in the last 72 hours.  Invalid input(s): FREET3 Anemia work up No results for input(s): VITAMINB12, FOLATE, FERRITIN, TIBC, IRON, RETICCTPCT in the last 72 hours. Urinalysis    Component Value Date/Time   COLORURINE YELLOW 04/06/2018 2101   APPEARANCEUR CLEAR 04/06/2018 2101   LABSPEC 1.023 04/06/2018 2101   PHURINE 5.0 04/06/2018 2101   GLUCOSEU NEGATIVE 04/06/2018 2101   GLUCOSEU NEG mg/dL 10/01/2007 2007   HGBUR NEGATIVE 04/06/2018 2101   HGBUR negative 12/05/2009 1003   BILIRUBINUR NEGATIVE 04/06/2018 2101   BILIRUBINUR moderate (A) 03/29/2018 1701   BILIRUBINUR small 07/31/2010 1713   [place] 04/06/2018 2101   PROTEINUR NEGATIVE 04/06/2018 2101   UROBILINOGEN 1.0 03/29/2018 1701   UROBILINOGEN 0.2 12/01/2014 2240   NITRITE POSITIVE (A) 04/06/2018 2101   LEUKOCYTESUR TRACE (A) 04/06/2018 2101   Sepsis Labs Invalid input(s): PROCALCITONIN,  WBC,  LACTICIDVEN Microbiology No results found for this or any previous visit (from the past 240  hour(s)).   Time coordinating discharge: 36 minutes  SIGNED:   Hosie Poisson, MD  [facility] 06/29/2019, 3:15 PM

## 2019-06-29 NOTE — TOC Progression Note (Signed)
Transition of Care [facility]) - Progression Note    Patient Details  Name: Kara Vincent MRN: [id number] Date of Birth: 1965/08/07  [facility]) CM/SW [place], RN Phone Number: 06/29/2019, 2:19 PM  Clinical Narrative: Trilogy has been setup through [employer]. Per MD Karleen Hampshire patient has to be on Trilogy in the room overnight to see how she does prior to discharging home with trilogy. Trilogy rep has been made aware of the plan and will deliver equipment to the room.       Expected Discharge Plan: Home/Self Care(friend to provide 24 hour care) Barriers to Discharge: Continued Medical Work up  Expected Discharge Plan and Services Expected Discharge Plan: Home/Self Care(friend to provide 24 hour care)   Discharge Planning Services: CM Consult   Living arrangements for the past 2 months: Homeless(living in hotel with friend)                   DME Agency: [employer](previously on O2 prior to admit)                   Social Determinants of Health (SDOH) Interventions    Readmission Risk Interventions No flowsheet data found.

## 2019-06-29 NOTE — Progress Notes (Signed)
   NAME:  Kara Vincent, MRN:  [id number], DOB:  1966/02/17, LOS: 71 ADMISSION DATE:  06/13/2019, CONSULTATION DATE:  06/18/2019 REFERRING MD:  Alfredia Ferguson CHIEF COMPLAINT:  SOB   Brief History   54 year old woman with hx of morbid obesity, chronic pain/anxiety presenting with acute on chronic hypercarbia.  Past Medical History  OSA w/o CPAP, chronic O2 need, OHS, homeless, "asthma", hidranitis, anxiety, chronic pain  Consults:  Neurology  Significant Diagnostic Tests:  CT angio chest 3/23 >> atherosclerosis Doppler legs b/l 3/23 >> no DVT Echo 3/29 >> EF 60 to 65%  Micro Data:  SARS CoV2 TAT 3/23 >> negative  Antimicrobials:  Ceftriaxone 3/22 >> 3/29 Cephalexin 3/30 >> 4/01  Interim history/subjective:  Had "rough night" coughing then SOB. Got better w/ SABA   Objective   Blood Pressure 133/75   Pulse 66   Temperature 97.9 F (36.6 C) (Oral)   Respiration 16   Height 5\' 6"  (1.676 m)   Weight (Abnormal) 187 kg   Last Menstrual Period 03/24/2009   Oxygen Saturation 97%   Body Mass Index 66.54 kg/m   I/O last 3 completed shifts: In: -  Out: 700 [Urine:700]  Physical examination: General alert 54 year old chronically ill female resting at bedside HENT NCAT no JVD  Pulm clear no wheeze no accessory use 4 lpm via [place] Card RRR abd not tender Ext dependent edema  Neuro intact    Assessment & Plan:   Acute on chronic hypoxic/hypercapnic respiratory failure in setting of untreated OSA/OHS w/ decompensated diastolic HF and cor pulmonale.  Asthma exacerbation   Discussion  Now -3.3 liters negative.  4 lpm [place] Sounds like there may be DME ([employer] that can help)-->primary has signed paper  Plan Cont supplemental oxygen will need this 24/7 and should check walking oximetry prior to dc to ensure adequate O2 Cont lasix  BIPAP a must at discharge (hope that this will work out for her) incr protonix to bid (think she is refluxing at night) Cont brovana and pulmicort    Will set her up w/ follow up in our office    Erick Colace ACNP-BC [place] Pager # [phone] OR # [phone] if no answer

## 2019-06-30 LAB — BASIC METABOLIC PANEL
Anion gap: 11 (ref 5–15)
BUN: 37 mg/dL — ABNORMAL HIGH (ref 6–20)
CO2: 36 mmol/L — ABNORMAL HIGH (ref 22–32)
Calcium: 8.3 mg/dL — ABNORMAL LOW (ref 8.9–10.3)
Chloride: 94 mmol/L — ABNORMAL LOW (ref 98–111)
Creatinine, Ser: 1.63 mg/dL — ABNORMAL HIGH (ref 0.44–1.00)
GFR calc Af Amer: 41 mL/min — ABNORMAL LOW (ref >60)
GFR calc non Af Amer: 35 mL/min — ABNORMAL LOW (ref >60)
Glucose, Bld: 119 mg/dL — ABNORMAL HIGH (ref 70–99)
Potassium: 4.4 mmol/L (ref 3.5–5.1)
Sodium: 141 mmol/L (ref 135–145)

## 2019-06-30 LAB — GLUCOSE, CAPILLARY
Glucose-Capillary: 92 mg/dL (ref 70–99)
Glucose-Capillary: 96 mg/dL (ref 70–99)

## 2019-06-30 MED ORDER — DARIFENACIN HYDROBROMIDE ER 7.5 MG PO TB24: 7.5000 mg | ORAL_TABLET | Freq: Every day | ORAL | 2 refills | Status: DC

## 2019-06-30 MED ORDER — VITAMIN E 100 UNITS PO TABS: 1.0000 | ORAL_TABLET | Freq: Every day | ORAL | 2 refills | Status: AC

## 2019-06-30 MED ORDER — FISH OIL 1000 MG PO CAPS: 1.0000 | ORAL_CAPSULE | Freq: Every day | ORAL | 0 refills | Status: AC

## 2019-06-30 MED ORDER — TRIAMCINOLONE ACETONIDE 0.1 % EX CREA: 1.0000 "application " | TOPICAL_CREAM | Freq: Two times a day (BID) | CUTANEOUS | 1 refills | Status: DC

## 2019-06-30 MED ORDER — IPRATROPIUM-ALBUTEROL 0.5-2.5 (3) MG/3ML IN SOLN: 3.0000 mL | Freq: Four times a day (QID) | RESPIRATORY_TRACT | 6 refills | Status: AC | PRN

## 2019-06-30 MED ORDER — NYSTATIN 100000 UNIT/GM EX POWD: Freq: Four times a day (QID) | CUTANEOUS | 5 refills | Status: DC

## 2019-06-30 MED ORDER — ACETAMINOPHEN-CODEINE #3 300-30 MG PO TABS: 1.0000 | ORAL_TABLET | Freq: Three times a day (TID) | ORAL | 0 refills | Status: AC | PRN

## 2019-06-30 MED ORDER — CARBAMIDE PEROXIDE 6.5 % OT SOLN: 5.0000 [drp] | Freq: Two times a day (BID) | OTIC | 0 refills | Status: DC

## 2019-06-30 NOTE — Progress Notes (Signed)
Pt is on her home trilogy NIV. NAD. Will continue to monitor and return if needed.

## 2019-06-30 NOTE — Plan of Care (Signed)
  Problem: Clinical Measurements: Goal: Will remain free from infection Outcome: Progressing   Problem: Clinical Measurements: Goal: Respiratory complications will improve Outcome: Progressing   

## 2019-06-30 NOTE — Progress Notes (Signed)
Patients friend/boyfriend came to bedside to collect the cpap and bedside commode for patient. This RN asked friend to wait for patient to be discharged so he could help her to bus. Patient raised her voice stating she is not a child and the friend can leave, she stated she does not need help. Patient insist on taking bus stating she takes the bus all the time and does not need a Public librarian.  Friend left hospital.  Patient does not want staff to escort her out.

## 2019-06-30 NOTE — Discharge Summary (Signed)
Physician Discharge Summary  Kara Vincent JJO:[id number] DOB: 27-Feb-1966 DOA: 06/13/2019  PCP: Ladell Pier, MD  Admit date: 06/13/2019 Discharge date: 06/30/2019  Admitted From: Home  Disposition: Home with Home health.   Recommendations for Outpatient Follow-up:  1. Follow up with PCP in 1-2 weeks 2. Please obtain BMP/CBC in one week Please follow up with PCCM as recommended.   Discharge Condition: stable.  CODE STATUS: full code Diet recommendation: Heart Healthy   Brief/Interim Summary: Kara Vincent is a 54 y.o. year old female with medical history significant for morbid obesity, chronic pain/anxiety, chronic hypoxic/hypercarbic respiratory failure on 2 L nasal cannula, obesity hypoventilation syndrome who presented on 06/13/2019 with 1 week of bilateral leg pain and swelling, and shortness of breath and was admitted with working diagnosis of lower extremity cellulitis and panniculitis. : Chest x-ray with concern for atelectasis versus pneumonia, given shortness of breath CTA was obtained which was negative for any PE and Dopplers negative for DVT.  Course complicated by worsening CO2 retention resulting in decreased mental status and worsening respiratory function.  With initiation of NIPPV (patient had episodes of adherence) patient clinically improved.  PCCM recommends continuing BiPAP for long-term use as outpatient in setting of OSA/OHS and chronic O2 needs.  Arrangement of NIV at home complicated by patient's home disposition--due to bed bugs in home agencies are declining to assess. Her course was complicated by mild CHF exacerbation as evident by worsening wheezing and dyspnea with vascular congestion onCXR that required IV lasix diuresis in addition to scheduled inhalers and IV solumedrol. Transitioning to oral lasix on 4/6.  Discharge Diagnoses:  Principal Problem:   Cellulitis of both lower extremities Active Problems:   Asthma exacerbation   Acute on chronic  respiratory failure with hypoxia and hypercapnia (HCC)   CKD (chronic kidney disease) stage 3, GFR 30-59 ml/min (HCC)   OSA (obstructive sleep apnea)   Essential hypertension   Gout   Diastolic CHF, acute on chronic (HCC)   BMI 60.0-69.9, adult (HCC)   Acute respiratory failure with hypoxia (HCC)   Cellulitis  Acute on chronic hypoxic and hypercarbic respiratory failure, multifactorial etiology related to OSA, obesity hypoventilation syndrome/asthma, stable.   O2 status at baseline 4L, wheezing scant. Patient  responding well to NIPPV in hospital.   -Patient greatly benefits from BiPAP, PCCM recommends continue long-term management -referral for home BiPAP, the equipment to be delivered today.  --IS, flutter valve, tessalon pears, schedule mucinex  continue with Pulmicort, Brovana on discharge.  --once able to get home NIV will need to touch base with PCCM to get appropriate vent settings  CHF with preserved EF, with exacerbation, improving TTE on 1601 showed diastolic dysfunction.  Repeat TTE here shows preserved EF.  Net -3.1L since hospital stay weights unreliable in charting. Resume oral lasix on discharge.  Bilateral lower extremity cellulitis/panniculitis,resolved. Completed 10 day course (ceftriaxone 3/22-3/29, Keflex 3/30-4/1) -Remains afebrile, rash improving   Myoclonic jerking, resolved.  In the setting of hypercarbia.  Neurology evaluated and agree with most likely related to elevated CO2 due to above. -Continue to monitor, treating hypercarbia with NIPPV  Hyperkalemia, resolved with discontinuation of lisinopril Hold lisinopril on discharge.   Hyperglycemia, in the setting of IV steroid use.  A1c 6, consistent with type 2 diabetes CBG (last 3)  Recent Labs    06/29/19 2050 06/30/19 0746 06/30/19 1122  GLUCAP 135* 92 96     HTN, stable -Continue clonidine, Imdur,   CKD stage III, stable Baseline creatinine  of 1.4-1.7 -Avoid nephrotoxins,    Anemia  of chronic kidney disease, stable -No signs or symptoms of bleeding  Bedbugs -contact precautions, supportive care  History of gout, stable -continue home allopurinol  History of sleep apnea has not been adherent as patient lost CPAP, continue CPAP at bedtime  Morbid obesity BMI 63.77 -Weight loss/dietary counseling provided during hospital stay.  Chronic pain -Tylenol as needed every 8 hours, Percocet 2 tabs as needed every 6 hours, closely monitor mental status, avoid narcotics given somnolence -Emphasized no outpatient prescriptions will be provided, will need to discuss with primary care physician -Cymbalta -continue bowel regimen with MiraLAX twice daily and senna docusate    Discharge Instructions  Discharge Instructions    Diet - low sodium heart healthy   Complete by: As directed    Increase activity slowly   Complete by: As directed      Allergies as of 06/30/2019      Reactions   Ibuprofen Hives, Shortness Of Breath, Palpitations, Rash   Has tolerated toradol 12/2013 inpatient   Penicillins Anaphylaxis, Hives, Rash   Has patient had a PCN reaction causing immediate rash, facial/tongue/throat swelling, SOB or lightheadedness with hypotension: Yes Has patient had a PCN reaction causing severe rash involving mucus membranes or skin necrosis: Yes Has patient had a PCN reaction that required hospitalization Unknown Has patient had a PCN reaction occurring within the last 10 years: Unknown If all of the above answers are "NO", then may proceed with Cephalosporin use.   Sulfa Antibiotics Hives, Shortness Of Breath, Rash, Cough   Adhesive [tape] Rash   Doxycycline Hives, Swelling, Rash   Sulfamethoxazole-trimethoprim Rash   Zithromax [azithromycin] Hives, Swelling, Rash      Medication List    STOP taking these medications   atorvastatin 20 MG tablet Commonly known as: LIPITOR   bacitracin ointment   ketoconazole 2 % shampoo Commonly known as:  Nizoral   lisinopril 40 MG tablet Commonly known as: ZESTRIL   VESIcare 5 MG tablet Generic drug: solifenacin Replaced by: darifenacin 7.5 MG 24 hr tablet     TAKE these medications   acetaminophen-codeine 300-30 MG tablet Commonly known as: TYLENOL #3 Take 1 tablet by mouth every 8 (eight) hours as needed for up to 3 days for moderate pain. Each prescription to last one month   albuterol 108 (90 Base) MCG/ACT inhaler Commonly known as: VENTOLIN HFA Inhale 1-2 puffs into the lungs every 6 (six) hours as needed for wheezing or shortness of breath.   allopurinol 100 MG tablet Commonly known as: ZYLOPRIM Take 1 tablet (100 mg total) by mouth every morning. What changed: when to take this   arformoterol 15 MCG/2ML Nebu Commonly known as: BROVANA Take 2 mLs (15 mcg total) by nebulization 2 (two) times daily.   benzonatate 100 MG capsule Commonly known as: TESSALON Take 1 capsule (100 mg total) by mouth 2 (two) times daily.   budesonide 0.25 MG/2ML nebulizer solution Commonly known as: PULMICORT Take 2 mLs (0.25 mg total) by nebulization 2 (two) times daily.   carbamide peroxide 6.5 % OTIC solution Commonly known as: DEBROX Place 5 drops into both ears 2 (two) times daily.   cholecalciferol 25 MCG (1000 UNIT) tablet Commonly known as: VITAMIN D3 Take 1,000 Units by mouth daily.   cloNIDine 0.2 MG tablet Commonly known as: CATAPRES Take 1 tablet (0.2 mg total) by mouth 2 (two) times daily.   [employer] JO[employer] TRIPLE ACTION PO Take 1 tablet by mouth daily.  darifenacin 7.5 MG 24 hr tablet Commonly known as: ENABLEX Take 1 tablet (7.5 mg total) by mouth daily. Start taking on: July 01, 2019 Replaces: VESIcare 5 MG tablet   DULoxetine 30 MG capsule Commonly known as: Cymbalta Take 1 capsule (30 mg total) by mouth daily.   Fish Oil 1000 MG Caps Take 1 capsule (1,000 mg total) by mouth daily. What changed:   medication strength  how much to take   furosemide  40 MG tablet Commonly known as: LASIX Take 1 tablet (40 mg total) by mouth daily. 1 tab po daily   guaiFENesin-dextromethorphan 100-10 MG/5ML syrup Commonly known as: ROBITUSSIN DM Take 5 mLs by mouth every 4 (four) hours as needed for cough.   hydrOXYzine 50 MG tablet Commonly known as: ATARAX/VISTARIL TAKE ONE TABLET BY MOUTH THREE TIMES DAILY AS NEEDED FOR ANXIETY OR FOR ITCHING What changed: See the new instructions.   ipratropium-albuterol 0.5-2.5 (3) MG/3ML Soln Commonly known as: DUONEB Take 3 mLs by nebulization every 6 (six) hours as needed (SOB).   isosorbide mononitrate 30 MG 24 hr tablet Commonly known as: IMDUR Take 1 tablet (30 mg total) by mouth daily.   Misc. Devices Misc Please provide patient with a raised toilet seat that is covered under her current insurance plan.   Misc. Devices Misc Heavy duty shower chair; Heavy duty potty chair. Dx: Osteoarthritis   nystatin powder Commonly known as: MYCOSTATIN/NYSTOP Apply topically 4 (four) times daily. Apply beneath breast and below abdominal fold   pantoprazole 40 MG tablet Commonly known as: PROTONIX Take 1 tablet (40 mg total) by mouth 2 (two) times daily before a meal. What changed: when to take this   polyethylene glycol 17 g packet Commonly known as: MIRALAX / GLYCOLAX Take 17 g by mouth daily as needed.   senna-docusate 8.6-50 MG tablet Commonly known as: Senokot-S Take 1 tablet by mouth at bedtime as needed for mild constipation.   triamcinolone cream 0.1 % Commonly known as: KENALOG Apply 1 application topically 2 (two) times daily. To rash on back   Vitamin E 100 units Tabs Take 1 tablet by mouth daily. What changed: medication strength            Durable Medical Equipment  (From admission, onward)         Start     Ordered   06/30/19 1514  For home use only DME Nebulizer machine  Once    Question Answer Comment  Patient needs a nebulizer to treat with the following condition  Respiratory failure, chronic ([place])   Length of Need Lifetime      06/30/19 1514   06/22/19 1254  For home use only DME Bedside commode  Once    Comments: Bariatric - weight 406lbs  Question:  Patient needs a bedside commode to treat with the following condition  Answer:  Weakness   06/22/19 1254         Follow-up Information    Ladell Pier, MD. Schedule an appointment as soon as possible for a visit in 1 week(s).   Specialty: Internal Medicine Contact information: [address] [place] [address] [phone]          Allergies  Allergen Reactions  . Ibuprofen Hives, Shortness Of Breath, Palpitations and Rash    Has tolerated toradol 12/2013 inpatient  . Penicillins Anaphylaxis, Hives and Rash    Has patient had a PCN reaction causing immediate rash, facial/tongue/throat swelling, SOB or lightheadedness with hypotension: Yes Has patient  had a PCN reaction causing severe rash involving mucus membranes or skin necrosis: Yes Has patient had a PCN reaction that required hospitalization Unknown Has patient had a PCN reaction occurring within the last 10 years: Unknown If all of the above answers are "NO", then may proceed with Cephalosporin use.   . Sulfa Antibiotics Hives, Shortness Of Breath, Rash and Cough  . Adhesive [Tape] Rash  . Doxycycline Hives, Swelling and Rash  . Sulfamethoxazole-Trimethoprim Rash  . Zithromax [Azithromycin] Hives, Swelling and Rash    Consultations:  PCCM.    Procedures/Studies: DG Chest 2 View  Result Date: 06/13/2019 CLINICAL DATA:  Shortness of breath. Patient reports bilateral lower leg pain, redness and swelling. EXAM: CHEST - 2 VIEW COMPARISON:  CT angiogram chest 03/12/2019, chest radiograph 03/12/2019 FINDINGS: Limited evaluation due to patient body habitus and shallow inspiration. Mild cardiomegaly appears unchanged. Mild ill-defined opacity within the lateral right lung base. No evidence of pleural effusion or  pneumothorax. No acute bony abnormality. IMPRESSION: Limited examination as described. Shallow inspiration radiograph. Mild ill-defined opacity within the lateral right lung base which may reflect atelectasis. Pneumonia cannot be excluded. Unchanged mild cardiomegaly. Electronically Signed   By: Kellie Simmering DO   On: 06/13/2019 19:53   DG Tibia/Fibula Left  Result Date: 06/13/2019 CLINICAL DATA:  Bilateral lower leg pain. Redness and swelling. EXAM: LEFT TIBIA AND FIBULA - 2 VIEW COMPARISON:  None. FINDINGS: Cortical margins of the tibia and fibula are intact. No fracture. No bony destruction or periosteal reaction. Moderate osteoarthritis of the knee joint. Mild osteoarthritis of the ankle. Generalized soft tissue edema and soft tissue prominence. No soft tissue air or radiopaque foreign body. Multiple phleboliths in the soft tissues. IMPRESSION: Generalized soft tissue edema.  No acute osseous abnormality. Electronically Signed   By: Keith Rake M.D.   On: 06/13/2019 19:51   DG Tibia/Fibula Right  Result Date: 06/13/2019 CLINICAL DATA:  Bilateral lower leg pain. Redness and swelling. EXAM: RIGHT TIBIA AND FIBULA - 2 VIEW COMPARISON:  None. FINDINGS: Cortical margins of the tibia and fibula are intact. No fracture. No bony destruction or periosteal reaction. Moderate osteoarthritis of the knee joint. Mild osteoarthritis of the ankle. Generalized soft tissue edema and soft tissue prominence. No soft tissue air or radiopaque foreign body. Multiple phleboliths in the soft tissues. IMPRESSION: Generalized soft tissue edema. No acute osseous abnormality. Electronically Signed   By: Keith Rake M.D.   On: 06/13/2019 19:50   DG Abd 1 View  Result Date: 06/16/2019 CLINICAL DATA:  Abdominal pain with nausea and headaches. EXAM: ABDOMEN - 1 VIEW COMPARISON:  Abdominal radiographs CT abdomen/pelvis 11/28/2011. 12/27/2011 FINDINGS: Air distention of a nondescript bowel loop within the right hemiabdomen.  Large colonic stool burden within the right colon and distal colon/rectum. No acute bony abnormality. Thoracic spondylosis. IMPRESSION: Air distention of nondescript bowel loop within the right hemiabdomen, nonspecific. Large stool burden within the right colon and distal colon/rectum. Electronically Signed   By: Kellie Simmering DO   On: 06/16/2019 09:08   CT Angio Chest PE W and/or Wo Contrast  Result Date: 06/14/2019 CLINICAL DATA:  Right leg pain and redness with swelling for 1 week EXAM: CT ANGIOGRAPHY CHEST WITH CONTRAST TECHNIQUE: Multidetector CT imaging of the chest was performed using the standard protocol during bolus administration of intravenous contrast. Multiplanar CT image reconstructions and MIPs were obtained to evaluate the vascular anatomy. CONTRAST:  60mL OMNIPAQUE IOHEXOL 350 MG/ML SOLN COMPARISON:  03/12/2019 FINDINGS: Cardiovascular: Limited opacification of  the pulmonary arteries due to body habitus and bolus dispersion. No pulmonary embolism is seen, definitely nondiagnostic beyond the main and lobar level. Coronary calcification. No cardiomegaly or pericardial effusion. Mild atherosclerotic calcification. Mediastinum/Nodes: Negative for adenopathy or mass. Lungs/Pleura: Linear atelectasis/scarring. There is no edema, consolidation, effusion, or pneumothorax. Symmetric narrowing of the bilateral mainstem bronchi, likely bronchomalacia. Upper Abdomen: No acute finding Musculoskeletal: Spondylosis.  No acute or aggressive finding Review of the MIP images confirms the above findings. IMPRESSION: 1. Severely limited chest CTA due to body habitus and bolus dispersion. No pulmonary embolism is seen at the main or lobar levels. 2.  Aortic Atherosclerosis (ICD10-I70.0).  Coronary atherosclerosis Electronically Signed   By: Monte Fantasia M.D.   On: 06/14/2019 04:08   DG CHEST PORT 1 VIEW  Result Date: 06/25/2019 CLINICAL DATA:  Wheezing and cough EXAM: PORTABLE CHEST 1 VIEW COMPARISON:   06/21/2019 FINDINGS: Cardiomegaly and vascular pedicle widening with vascular congestion. No Kerley lines, effusion, or pneumothorax. There is artifact from EKG leads. IMPRESSION: Cardiomegaly and vascular congestion. Electronically Signed   By: Monte Fantasia M.D.   On: 06/25/2019 11:10   DG CHEST PORT 1 VIEW  Result Date: 06/21/2019 CLINICAL DATA:  Shortness of breath EXAM: PORTABLE CHEST 1 VIEW COMPARISON:  June 20, 2019 FINDINGS: Mild bibasilar atelectasis. No edema or consolidation. There is cardiomegaly with a degree of pulmonary vascular congestion. IMPRESSION: No active disease. Electronically Signed   By: Lowella Grip III M.D.   On: 06/21/2019 08:57   DG CHEST PORT 1 VIEW  Result Date: 06/20/2019 CLINICAL DATA:  Shortness of breath EXAM: PORTABLE CHEST 1 VIEW COMPARISON:  Yesterday FINDINGS: Cardiomegaly and vascular pedicle widening accentuated by rotation. Low volume chest with diffuse vascular congestion. Interstitial prominence without Kerley lines. No definite effusion. IMPRESSION: CHF pattern.  Vascular congestion appears increased from yesterday. Electronically Signed   By: Monte Fantasia M.D.   On: 06/20/2019 07:14   DG CHEST PORT 1 VIEW  Result Date: 06/19/2019 CLINICAL DATA:  Shortness of breath EXAM: PORTABLE CHEST 1 VIEW COMPARISON:  06/18/2019 FINDINGS: Bilateral interstitial prominence. Lingular airspace disease which may reflect atelectasis versus pneumonia. No pleural effusion or pneumothorax. Stable cardiomediastinal silhouette. No aggressive osseous lesion. IMPRESSION: Lingular airspace disease which may reflect atelectasis versus pneumonia. Cardiomegaly with pulmonary vascular congestion. Electronically Signed   By: Kathreen Devoid   On: 06/19/2019 07:58   DG CHEST PORT 1 VIEW  Result Date: 06/18/2019 CLINICAL DATA:  Shortness of breath. Cellulitis of lower extremity EXAM: PORTABLE CHEST 1 VIEW COMPARISON:  June 13, 2019 FINDINGS: The mediastinal contour is stable.  Heart size is enlarged. Increased pulmonary interstitium is identified bilaterally. No focal pneumonia or pleural effusion is identified. The bony structures are stable. IMPRESSION: Congestive heart failure. Electronically Signed   By: Abelardo Diesel M.D.   On: 06/18/2019 11:25   EEG adult  Result Date: 06/17/2019 Lora Havens, MD     06/17/2019  5:12 PM Patient Name: Kara Vincent MRN: [id number] Epilepsy Attending: Lora Havens Referring Physician/Provider: Dr. Osborne Oman Date: 06/17/2019 Duration: 21.55 mins Patient history: 54 year old female with jerking movements in arms.  EEG to evaluate for seizures. Level of alertness: Awake and asleep AEDs during EEG study: None Technical aspects: This EEG study was done with scalp electrodes positioned according to the 10-20 International system of electrode placement. Electrical activity was acquired at a sampling rate of 500Hz  and reviewed with a high frequency filter of 70Hz  and a low frequency filter of  1Hz . EEG data were recorded continuously and digitally stored. Description: The posterior dominant rhythm consists of 8 Hz activity of moderate voltage (25-35 uV) seen predominantly in posterior head regions, symmetric and reactive to eye opening and eye closing. Sleep was characterized by generalized 4-6hz  theta-delta slowing. Hyperventilation and photic stimulation were not performed. IMPRESSION: This study is within normal limits. No seizures or epileptiform discharges were seen throughout the recording. Lora Havens   ECHOCARDIOGRAM COMPLETE  Result Date: 06/20/2019    ECHOCARDIOGRAM REPORT   Patient Name:   Kara Vincent Date of Exam: 06/20/2019 Medical Rec #:  [id number]         Height:       66.0 in Accession #:    [id number]        Weight:       395.1 lb Date of Birth:  Aug 10, 1965          BSA:          2.671 m Patient Age:    83 years          BP:           114/71 mmHg Patient Gender: F                 HR:           76 bpm. Exam  Location:  Inpatient Procedure: 2D Echo, Color Doppler and Cardiac Doppler Indications:    R06.9 DOE  History:        Patient has prior history of Echocardiogram examinations, most                 recent 07/09/2017. CHF; Risk Factors:Sleep Apnea, Hypertension                 and Dyslipidemia.  Sonographer:    Raquel Sarna Senior RDCS Referring Phys: [id number] Georgina Quint LATIF [facility]  Sonographer Comments: Technically difficult study with very poor image quality due to morbid obesity and patient being unable to tolerate probe pressure. IMPRESSIONS  1. Technically difficult study with limited views. Grossly normal LV systolic function. Left ventricular ejection fraction, by estimation, is 60 to 65%. Left ventricular diastolic parameters are indeterminate.  2. Right ventricular systolic function was not well visualized. The right ventricular size is not well visualized.  3. The mitral valve was not well visualized. No evidence of mitral valve regurgitation.  4. The aortic valve was not well visualized. Aortic valve regurgitation is not visualized. No aortic stenosis is present.  5. The inferior vena cava is dilated in size with >50% respiratory variability, suggesting right atrial pressure of 8 mmHg. FINDINGS  Left Ventricle: Left ventricular ejection fraction, by estimation, is 60 to 65%. The left ventricle has normal function. The left ventricle has no regional wall motion abnormalities. The left ventricular internal cavity size was normal in size. There is  Not well visualized left ventricular hypertrophy. Left ventricular diastolic parameters are indeterminate. Right Ventricle: The right ventricular size is not well visualized. Right vetricular wall thickness was not assessed. Right ventricular systolic function was not well visualized. Left Atrium: Left atrial size was not well visualized. Right Atrium: Right atrial size was not well visualized. Pericardium: Trivial pericardial effusion is present. Presence of pericardial fat  pad. Mitral Valve: The mitral valve was not well visualized. No evidence of mitral valve regurgitation. Tricuspid Valve: The tricuspid valve is not well visualized. Tricuspid valve regurgitation is not demonstrated. Aortic Valve: The aortic valve was not well visualized. Aortic valve regurgitation  is not visualized. No aortic stenosis is present. Pulmonic Valve: The pulmonic valve was not well visualized. Pulmonic valve regurgitation is not visualized. Aorta: The aortic root was not well visualized. Venous: The inferior vena cava is dilated in size with greater than 50% respiratory variability, suggesting right atrial pressure of 8 mmHg. IAS/Shunts: The interatrial septum was not well visualized.  AORTIC VALVE LVOT Vmax:   116.00 cm/s LVOT Vmean:  63.900 cm/s LVOT VTI:    0.209 m  SHUNTS Systemic VTI: 0.21 m Oswaldo Milian MD Electronically signed by Oswaldo Milian MD Signature Date/Time: 06/20/2019/2:30:30 PM    Final    VAS [place] LOWER EXTREMITY VENOUS (DVT)  Result Date: 06/14/2019  Lower Venous DVTStudy Indications: Edema.  Limitations: Body habitus and poor ultrasound/tissue interface. Comparison Study: 07/07/17 previous Performing Technologist: Abram Sander RVS  Examination Guidelines: A complete evaluation includes B-mode imaging, spectral Doppler, color Doppler, and power Doppler as needed of all accessible portions of each vessel. Bilateral testing is considered an integral part of a complete examination. Limited examinations for reoccurring indications may be performed as noted. The reflux portion of the exam is performed with the patient in reverse Trendelenburg.  +---------+---------------+---------+-----------+----------+--------------+ RIGHT    CompressibilityPhasicitySpontaneityPropertiesThrombus Aging +---------+---------------+---------+-----------+----------+--------------+ CFV      Full           Yes      Yes                                  +---------+---------------+---------+-----------+----------+--------------+ SFJ      Full                                                        +---------+---------------+---------+-----------+----------+--------------+ FV Prox  Full                                                        +---------+---------------+---------+-----------+----------+--------------+ FV Mid   Full                                                        +---------+---------------+---------+-----------+----------+--------------+ FV DistalFull                                                        +---------+---------------+---------+-----------+----------+--------------+ PFV      Full                                                        +---------+---------------+---------+-----------+----------+--------------+ POP      Full           Yes      Yes                                 +---------+---------------+---------+-----------+----------+--------------+  PTV                                                   Not visualized +---------+---------------+---------+-----------+----------+--------------+ PERO                                                  Not visualized +---------+---------------+---------+-----------+----------+--------------+   +---------+---------------+---------+-----------+----------+--------------+ LEFT     CompressibilityPhasicitySpontaneityPropertiesThrombus Aging +---------+---------------+---------+-----------+----------+--------------+ CFV      Full           Yes      Yes                                 +---------+---------------+---------+-----------+----------+--------------+ SFJ      Full                                                        +---------+---------------+---------+-----------+----------+--------------+ FV Prox  Full                                                         +---------+---------------+---------+-----------+----------+--------------+ FV Mid   Full                                                        +---------+---------------+---------+-----------+----------+--------------+ FV DistalFull                                                        +---------+---------------+---------+-----------+----------+--------------+ PFV      Full                                                        +---------+---------------+---------+-----------+----------+--------------+ POP      Full           Yes      Yes                                 +---------+---------------+---------+-----------+----------+--------------+ PTV      Full                                                        +---------+---------------+---------+-----------+----------+--------------+  PERO                                                  Not visualized +---------+---------------+---------+-----------+----------+--------------+     Summary: BILATERAL: - No evidence of deep vein thrombosis seen in the lower extremities, bilaterally.   *See table(s) above for measurements and observations. Electronically signed by Deitra Mayo MD on 06/14/2019 at 1:09:05 PM.    Final       Subjective: No new complaints.   Discharge Exam: Vitals:   06/30/19 0743 06/30/19 0748  BP: 119/75   Pulse: 70   Resp:    Temp: 98 F (36.7 C)   SpO2: 97% 96%   Vitals:   06/29/19 2233 06/30/19 0500 06/30/19 0743 06/30/19 0748  BP: 135/82  119/75   Pulse: 82  70   Resp: 20     Temp: 98 F (36.7 C)  98 F (36.7 C)   TempSrc: Oral     SpO2: 95%  97% 96%  Weight:  (!) 193.5 kg    Height:        General: Pt is alert, awake, not in acute distress Cardiovascular: RRR, S1/S2 +, no rubs, no gallops Respiratory: CTA bilaterally, no wheezing, no rhonchi Abdominal: Soft, NT, ND, bowel sounds + Extremities: no cyanosis    The results of significant diagnostics from this  hospitalization (including imaging, microbiology, ancillary and laboratory) are listed below for reference.     Microbiology: No results found for this or any previous visit (from the past 240 hour(s)).   Labs: BNP (last 3 results) Recent Labs    01/24/19 1646 03/12/19 0151 06/14/19 0212  BNP 35.7 51.2 500.3*   Basic Metabolic Panel: Recent Labs  Lab 06/26/19 0742 06/27/19 0307 06/28/19 0155 06/29/19 0236 06/30/19 1146  NA 141 140 140 140 141  K 4.7 4.7 4.5 5.4* 4.4  CL 96* 93* 94* 93* 94*  CO2 34* 34* 35* 37* 36*  GLUCOSE 111* 211* 240* 141* 119*  BUN 26* 31* 32* 34* 37*  CREATININE 1.46* 1.72* 1.51* 1.41* 1.63*  CALCIUM 8.6* 8.7* 8.8* 8.6* 8.3*   Liver Function Tests: No results for input(s): AST, ALT, ALKPHOS, BILITOT, PROT, ALBUMIN in the last 168 hours. No results for input(s): LIPASE, AMYLASE in the last 168 hours. No results for input(s): AMMONIA in the last 168 hours. CBC: Recent Labs  Lab 06/24/19 0815  WBC 9.8  HGB 10.6*  HCT 37.0  MCV 102.2*  PLT 326   Cardiac Enzymes: No results for input(s): CKTOTAL, CKMB, CKMBINDEX, TROPONINI in the last 168 hours. BNP: Invalid input(s): POCBNP CBG: Recent Labs  Lab 06/29/19 1159 06/29/19 1641 06/29/19 2050 06/30/19 0746 06/30/19 1122  GLUCAP 122* 118* 135* 92 96   D-Dimer No results for input(s): DDIMER in the last 72 hours. Hgb A1c No results for input(s): HGBA1C in the last 72 hours. Lipid Profile No results for input(s): CHOL, HDL, LDLCALC, TRIG, CHOLHDL, LDLDIRECT in the last 72 hours. Thyroid function studies No results for input(s): TSH, T4TOTAL, T3FREE, THYROIDAB in the last 72 hours.  Invalid input(s): FREET3 Anemia work up No results for input(s): VITAMINB12, FOLATE, FERRITIN, TIBC, IRON, RETICCTPCT in the last 72 hours. Urinalysis    Component Value Date/Time   COLORURINE YELLOW 04/06/2018 2101   APPEARANCEUR CLEAR 04/06/2018 2101   LABSPEC 1.023 04/06/2018 2101   PHURINE  5.0  04/06/2018 2101   GLUCOSEU NEGATIVE 04/06/2018 2101   GLUCOSEU NEG mg/dL 10/01/2007 2007   HGBUR NEGATIVE 04/06/2018 2101   HGBUR negative 12/05/2009 1003   BILIRUBINUR NEGATIVE 04/06/2018 2101   BILIRUBINUR moderate (A) 03/29/2018 1701   BILIRUBINUR small 07/31/2010 1713   [place] 04/06/2018 2101   PROTEINUR NEGATIVE 04/06/2018 2101   UROBILINOGEN 1.0 03/29/2018 1701   UROBILINOGEN 0.2 12/01/2014 2240   NITRITE POSITIVE (A) 04/06/2018 2101   LEUKOCYTESUR TRACE (A) 04/06/2018 2101   Sepsis Labs Invalid input(s): PROCALCITONIN,  WBC,  LACTICIDVEN Microbiology No results found for this or any previous visit (from the past 240 hour(s)).   Time coordinating discharge: 36 minutes  SIGNED:   Hosie Poisson, MD  [facility] 06/30/2019, 5:05 PM

## 2019-06-30 NOTE — Progress Notes (Signed)
Physical Therapy Treatment Patient Details Name: Kara Vincent MRN: [id number] DOB: [birth year] Today's Date: 06/30/2019    History of Present Illness  Kara Vincent is a 54 y.o. year old female with medical history significant for morbid obesity, chronic pain/anxiety, chronic hypoxic/hypercarbic respiratory failure on 2 L nasal cannula, obesity hypoventilation syndrome who presented on 06/13/2019 with 1 week of bilateral leg pain and swelling, and shortness of breath and was admitted with working diagnosis of lower extremity cellulitis and panniculitis.    PT Comments    Pt sitting EoB on entry agreeable to working with therapy. Pt limited in safe mobility by obesity, RLE pain and decreased strength and endurance. Pt requires supervision for transfers and min guard for ambulation of 5 feet with RW. Once in chair pt participated in therapeutic exercise. D/c plans remain appropriate. PT will continue to follow acutely.    Follow Up Recommendations  Home health PT;Supervision/Assistance - 24 hour     Equipment Recommendations  (shower chair)       Precautions / Restrictions Precautions Precautions: Fall Precaution Comments: morbid obesity  Restrictions Weight Bearing Restrictions: No    Mobility  Bed Mobility               General bed mobility comments: sitting EoB on entry   Transfers Overall transfer level: Needs assistance Equipment used: Rolling walker (2 wheeled) Transfers: Sit to/from [employer] Sit to Stand: Supervision         General transfer comment: supervision for lines/safety  Ambulation/Gait Ambulation/Gait assistance: Min guard Gait Distance (Feet): 5 Feet Assistive device: Rolling walker (2 wheeled) Gait Pattern/deviations: Step-through pattern;Decreased step length - right;Decreased step length - left;Wide base of support;Trunk flexed Gait velocity: decr Gait velocity interpretation: <1.31 ft/sec, indicative of household  ambulator General Gait Details: assist for lines/safety. pt with incontinece of urine upon standing caught by [facility] but limited distance of ambulation          Balance Overall balance assessment: Needs assistance Sitting-balance support: Feet supported;No upper extremity supported Sitting balance-Leahy Scale: Fair     Standing balance support: Bilateral upper extremity supported;During functional activity Standing balance-Leahy Scale: Poor Standing balance comment: UE support and supervision for static standing                            Cognition Arousal/Alertness: Awake/alert Behavior During Therapy: WFL for tasks assessed/performed Overall Cognitive Status: Within Functional Limits for tasks assessed                                        Exercises General Exercises - Lower Extremity Long Arc Quad: AROM;Both;10 reps;Seated Hip ABduction/ADduction: AROM;Both;10 reps;Seated Hip Flexion/Marching: AROM;Both;10 reps;Seated Heel Raises: AROM;Both;10 reps;Seated Mini-Sqauts: AROM;Both;10 reps;Seated    General Comments General comments (skin integrity, edema, etc.): VSS on 2L O2 via [place]      Pertinent Vitals/Pain Pain Assessment: Faces Faces Pain Scale: Hurts little more Pain Location: RLE Pain Descriptors / Indicators: Sore Pain Intervention(s): Limited activity within patient's tolerance;Monitored during session;Repositioned           PT Goals (current goals can now be found in the care plan section) Acute Rehab PT Goals Patient Stated Goal: home PT Goal Formulation: With patient Time For Goal Achievement: 07/09/19 Potential to Achieve Goals: Good Progress towards PT goals: Progressing toward goals    Frequency  Min 3X/week      PT Plan Current plan remains appropriate       AM-PAC PT "6 Clicks" Mobility   Outcome Measure  Help needed turning from your back to your side while in a flat bed without using bedrails?:  None Help needed moving from lying on your back to sitting on the side of a flat bed without using bedrails?: None Help needed moving to and from a bed to a chair (including a wheelchair)?: A Little Help needed standing up from a chair using your arms (e.g., wheelchair or bedside chair)?: None Help needed to walk in hospital room?: A Lot Help needed climbing 3-5 steps with a railing? : A Lot 6 Click Score: 19    End of Session Equipment Utilized During Treatment: Oxygen Activity Tolerance: Patient tolerated treatment well Patient left: with call bell/phone within reach;in chair Nurse Communication: Mobility status PT Visit Diagnosis: Unsteadiness on feet (R26.81);Difficulty in walking, not elsewhere classified (R26.2);Muscle weakness (generalized) (M62.81)     Time: 7829-5621 PT Time Calculation (min) (ACUTE ONLY): 23 min  Charges:  $Gait Training: 8-22 mins $Therapeutic Exercise: 8-22 mins                     Wilhelmine Krogstad B. Beverely Risen PT, DPT Acute Rehabilitation Services Pager [phone] Office [phone]    Elon Alas Fleet 06/30/2019, 1:26 PM

## 2019-06-30 NOTE — TOC Transition Note (Signed)
Transition of Care [facility]) - CM/SW Discharge Note   Patient Details  Name: Kara Vincent MRN: [id number] Date of Birth: 15-Feb-1966  [facility] CM/SW Contact:  Angelita Ingles, RN Phone Number: 06/30/2019, 4:09 PM   Clinical Narrative:   Portable tank has been ordered and to be delivered to bedside per [employer] [facility]. Patient has DME equipment NIV and bsc at bedside. Bedside nurse reports that patients has two friends that will be coming to the hospital to help patient transport  herself and equipment back to the hotel. No further CM needs identified at this time. Patient discharging under her own care.   Final next level of care: Home/Self Care Barriers to Discharge: No Barriers Identified   Patient Goals and CMS Choice        Discharge Placement                       Discharge Plan and Services   Discharge Planning Services: CM Consult            DME Arranged: Bedside commode, Oxygen, NIV(portable tank via [employer]) DME Agency: [employer] Date DME Agency Contacted: 06/30/19 Time DME Agency Contacted: 1500              Social Determinants of Health (SDOH) Interventions     Readmission Risk Interventions No flowsheet data found.

## 2019-07-01 ENCOUNTER — Encounter: Payer: Self-pay | Admitting: Pulmonary Disease

## 2019-07-01 ENCOUNTER — Telehealth: Payer: Self-pay

## 2019-07-01 NOTE — Telephone Encounter (Signed)
Transition Care Management Follow-up Telephone Call  Date of discharge and from where: 06/30/2019 [facility] Called pt at [phone], unable to reach ,left message to call back. Name and contact information provided. Pt has appt scheduled with Dr Wynetta Emery on 07/14/2019

## 2019-07-14 ENCOUNTER — Inpatient Hospital Stay: Payer: Medicaid Other | Admitting: Internal Medicine

## 2019-07-14 ENCOUNTER — Telehealth: Payer: Self-pay | Admitting: Internal Medicine

## 2019-07-14 NOTE — Telephone Encounter (Signed)
Patient came in requesting an Rx for a neutralizer machine. Please f/u

## 2019-07-15 NOTE — Telephone Encounter (Signed)
Will forward to Dr. Wynetta Emery

## 2019-07-15 NOTE — Telephone Encounter (Signed)
Contacted to make aware that her rx is ready for her nebulizer machine and she can pick it up at the front desk and if she has any questions or concerns to give a call

## 2019-07-20 ENCOUNTER — Encounter: Payer: Self-pay | Admitting: Critical Care Medicine

## 2019-07-20 ENCOUNTER — Other Ambulatory Visit: Payer: Self-pay | Admitting: Internal Medicine

## 2019-07-20 ENCOUNTER — Other Ambulatory Visit: Payer: Self-pay

## 2019-07-20 ENCOUNTER — Ambulatory Visit: Payer: Medicaid Other | Attending: Critical Care Medicine | Admitting: Critical Care Medicine

## 2019-07-20 ENCOUNTER — Telehealth: Payer: Self-pay

## 2019-07-20 VITALS — BP 128/84 | HR 81 | Temp 96.6°F | Resp 24

## 2019-07-20 DIAGNOSIS — L03115 Cellulitis of right lower limb: Secondary | ICD-10-CM

## 2019-07-20 DIAGNOSIS — G4733 Obstructive sleep apnea (adult) (pediatric): Secondary | ICD-10-CM

## 2019-07-20 DIAGNOSIS — L03116 Cellulitis of left lower limb: Secondary | ICD-10-CM

## 2019-07-20 DIAGNOSIS — J45901 Unspecified asthma with (acute) exacerbation: Secondary | ICD-10-CM

## 2019-07-20 DIAGNOSIS — J440 Chronic obstructive pulmonary disease with acute lower respiratory infection: Secondary | ICD-10-CM

## 2019-07-20 DIAGNOSIS — I5033 Acute on chronic diastolic (congestive) heart failure: Secondary | ICD-10-CM

## 2019-07-20 DIAGNOSIS — J9621 Acute and chronic respiratory failure with hypoxia: Secondary | ICD-10-CM

## 2019-07-20 DIAGNOSIS — J9622 Acute and chronic respiratory failure with hypercapnia: Secondary | ICD-10-CM

## 2019-07-20 DIAGNOSIS — I10 Essential (primary) hypertension: Secondary | ICD-10-CM

## 2019-07-20 MED ORDER — LEVOFLOXACIN 750 MG PO TABS: 750.0000 mg | ORAL_TABLET | Freq: Every day | ORAL | 0 refills | Status: AC

## 2019-07-20 MED ORDER — TRIAMCINOLONE ACETONIDE 0.1 % EX CREA: 453.0000 "application " | TOPICAL_CREAM | Freq: Two times a day (BID) | CUTANEOUS | 1 refills | Status: AC

## 2019-07-20 MED ORDER — NYSTATIN 100000 UNIT/GM EX POWD: Freq: Four times a day (QID) | CUTANEOUS | 5 refills | Status: DC

## 2019-07-20 MED ORDER — ACETAMINOPHEN-CODEINE #3 300-30 MG PO TABS: 1.0000 | ORAL_TABLET | ORAL | 0 refills | Status: DC | PRN

## 2019-07-20 MED ORDER — DARIFENACIN HYDROBROMIDE ER 7.5 MG PO TB24: 7.5000 mg | ORAL_TABLET | Freq: Every day | ORAL | 2 refills | Status: AC

## 2019-07-20 MED ORDER — PERMETHRIN 5 % EX CREA: 1.0000 "application " | TOPICAL_CREAM | Freq: Every day | CUTANEOUS | 0 refills | Status: DC

## 2019-07-20 MED ORDER — BENZONATATE 100 MG PO CAPS: 100.0000 mg | ORAL_CAPSULE | Freq: Two times a day (BID) | ORAL | 0 refills | Status: DC

## 2019-07-20 NOTE — Progress Notes (Signed)
Subjective:    Patient ID: Kara Vincent, female    DOB: 07/31/1965, 54 y.o.   MRN: [id number]  54 y.o.F post hosp f/u This is a 54 year old female primary care patient of Dr. Wynetta Emery seen for post hospital follow-up Patient was admitted on 22 March discharged on 8 April with morbid obesity, acute on chronic hypoxic and hypercarbic respiratory failure, obesity-hypoventilation syndrome, bilateral lower extremity cellulitis, chronic pain and anxiety,  The patient lives in very poor circumstances with her housing.  She is trying to take legal action as the landlord where she lives has not been maintaining the property.  Patient has had significant bedbug infestations.  She was admitted to the hospital with cellulitis and diffuse skin rash and acute on chronic respiratory failure.  Below is the discharge summary  Admit date: 06/13/2019 Discharge date: 06/30/2019  Admitted From: Home  Disposition: Home with Home health.   Recommendations for Outpatient Follow-up:  1. Follow up with PCP in 1-2 weeks 2. Please obtain BMP/CBC in one week Please follow up with PCCM as recommended.   Discharge Condition: stable.  CODE STATUS: full code Diet recommendation: Heart Healthy   Brief/Interim Summary: Kara Bayless Gerringeris a 54 y.o.year old femalewith medical history significant for morbid obesity, chronic pain/anxiety, chronic hypoxic/hypercarbic respiratory failure on 2 L nasal cannula, obesity hypoventilation syndrome who presented on 3/22/2021with 1 week of bilateral leg pain and swelling, and shortness of breath and was admitted with working diagnosis of lower extremity cellulitis and panniculitis. :Chest x-ray with concern for atelectasis versus pneumonia, given shortness of breath CTA was obtained which was negative for any PE and Dopplers negative for DVT. Course complicated by worsening CO2 retention resulting in decreased mental status and worsening respiratory function. With initiation  of NIPPV (patient had episodes of adherence) patient clinically improved. PCCM recommends continuing BiPAP for long-term use as outpatient in setting of OSA/OHS and chronic O2 needs. Arrangement of NIV at homecomplicated by patient's home disposition--due to bed bugs in home agencies are declining to assess.Her course was complicated by mild CHF exacerbation as evident by worsening wheezing and dyspnea with vascular congestion onCXR that required IV lasix diuresis in addition to scheduled inhalers and IV solumedrol. Transitioning to oral lasix on 4/6.  Discharge Diagnoses:  Principal Problem:   Cellulitis of both lower extremities Active Problems:   Asthma exacerbation   Acute on chronic respiratory failure with hypoxia and hypercapnia (HCC)   CKD (chronic kidney disease) stage 3, GFR 30-59 ml/min (HCC)   OSA (obstructive sleep apnea)   Essential hypertension   Gout   Diastolic CHF, acute on chronic (HCC)   BMI 60.0-69.9, adult (HCC)   Acute respiratory failure with hypoxia (HCC)   Cellulitis  Acute on chronic hypoxic and hypercarbic respiratory failure,multifactorial etiology related to OSA, obesity hypoventilation syndrome/asthma,stable. O2 status at baseline 4L, wheezing scant.Patient responding well to NIPPV in hospital.  -Patient greatly benefits from BiPAP, PCCM recommends continue long-term management -referral for home BiPAP, the equipment to be delivered today.  --IS, flutter valve,tessalon pears, schedule mucinex  continue with Pulmicort, Brovana on discharge.  --once able to get home NIV will need to touch base with PCCM to get appropriate vent settings  CHF with preserved EF, with exacerbation, improving TTE on 7062 showed diastolic dysfunction. Repeat TTE here shows preserved EF. Net -3.1L since hospital stay weights unreliable in charting. Resume oral lasix on discharge.  Bilateral lower extremity cellulitis/panniculitis,resolved. Completed 10 day course  (ceftriaxone 3/22-3/29, Keflex 3/30-4/1) -Remains afebrile, rash  improving   Myoclonic jerking, resolved. In the setting of hypercarbia. Neurology evaluated and agree with most likely related to elevated CO2 due to above. -Continue to monitor, treating hypercarbia with NIPPV  Hyperkalemia, resolvedwith discontinuation of lisinopril Hold lisinopril on discharge.   Hyperglycemia, in the setting of IV steroid use. A1c 6, consistent with type 2 diabetes CBG (last 3)  Recent Labs (last 2 labs)  Recent Labs   06/29/19 2050 06/30/19 0746 06/30/19 1122 GLUCAP 135* 92 96     HTN, stable -Continue clonidine, Imdur,   CKD stage III, stable Baseline creatinine of 1.4-1.7 -Avoid nephrotoxins,    Anemia of chronic kidney disease, stable -No signs or symptoms of bleeding  Bedbugs -contact precautions, supportive care  History of gout, stable -continue home allopurinol  History of sleep apnea has not been adherent as patient lost CPAP, continue CPAP at bedtime  Morbid obesity BMI 63.77 -Weight loss/dietary counseling provided during hospital stay.  Chronic pain -Tylenol as needed every 8 hours, Percocet 2 tabs as needed every 6 hours, closely monitor mental status, avoid narcotics given somnolence -Emphasized no outpatient prescriptions will be provided, will need to discuss with primary care physician -Cymbalta -continue bowel regimen with MiraLAX twice daily and senna docusate  The patient now is on bilevel with 3 L oxygen at night.  The patient is requesting portable oxygen system that is lightweight  The patient still having productive cough of thick yellow mucus.  She still dyspneic even at rest.  She comes in a motorized wheelchair with saturation on arrival of 88% room air  She is not able to ambulate distress for further evaluation   Past Medical History:  Diagnosis Date  . Allergy   . Anxiety   . Arthritis   . Asthma   . Asthma   . CHF  (congestive heart failure) ([place])   . CHF (congestive heart failure) ([place])   . Chronic abdominal pain   . Chronic kidney disease   . Chronic pain    "all over"  . 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 GERD (gastroesophageal reflux disease)   . Gout   . Hidradenitis   . HLD (hyperlipidemia) 12/14/2015  . Homelessness   . Hyperlipemia   . Hypertension   . Low back pain   . Morbid obesity ([place])    uses motor wheel chair  . MRSA (methicillin resistant Staphylococcus aureus)    states about a year ago  . On home O2    3L N/C O2 continuously  . OSA (obstructive sleep apnea)    non-compliant with CPAP  . Oxygen deficiency   . Renal insufficiency   . Sleep apnea      Family History  Problem Relation Age of Onset  . Asthma Mother   . Diabetes Father   . Stroke Father   . Heart disease Father   . Arthritis Sister   . Asthma Sister   . Asthma Daughter   . Asthma Son   . Arthritis Sister   . Asthma Sister      Social History   Socioeconomic History  . Marital status: Single    Spouse name: Not on file  . Number of children: Not on file  . Years of education: Not on file  . Highest education level: Not on file  Occupational History  . Not on file  Tobacco Use  . Smoking status: Former Smoker    Types: Cigarettes    Quit date:  03/25/2003    Years since quitting: 16.3  . Smokeless tobacco: Former Systems developer    Quit date: 05/27/1978  Substance and Sexual Activity  . Alcohol use: Yes    Alcohol/week: 0.0 standard drinks    Comment: years ago  no longer beer only  early 20's  . Drug use: Yes    Types: "Crack" cocaine, Other-see comments    Comment: years  ago  . Sexual activity: Yes    Birth control/protection: Other-see comments, None, Abstinence  Other Topics Concern  . Not on file  Social History Narrative   ** Merged History Encounter **       Patient has exhausted [facility] financial resources. She does have access to [employer] and [employer]. She  should be redirected to purse disability with her attorney and reapply of Medicaid   Social Determinants of Health   Financial Resource Strain:   . Difficulty of Paying Living Expenses:   Food Insecurity:   . Worried About Charity fundraiser in the Last Year:   . Arboriculturist in the Last Year:   Transportation Needs:   . Film/video editor (Medical):   [name] Lack of Transportation (Non-Medical):   Physical Activity:   . Days of Exercise per Week:   . Minutes of Exercise per Session:   Stress:   . Feeling of Stress :   Social Connections:   . Frequency of Communication with Friends and Family:   . Frequency of Social Gatherings with Friends and Family:   . Attends Religious Services:   . Active Member of Clubs or Organizations:   . Attends Archivist Meetings:   [name] Marital Status:   Intimate Partner Violence:   . Fear of Current or Ex-Partner:   . Emotionally Abused:   [name] Physically Abused:   . Sexually Abused:      Allergies  Allergen Reactions  . Ibuprofen Hives, Shortness Of Breath, Palpitations and Rash    Has tolerated toradol 12/2013 inpatient  . Penicillins Anaphylaxis, Hives and Rash    Has patient had a PCN reaction causing immediate rash, facial/tongue/throat swelling, SOB or lightheadedness with hypotension: Yes Has patient had a PCN reaction causing severe rash involving mucus membranes or skin necrosis: Yes Has patient had a PCN reaction that required hospitalization Unknown Has patient had a PCN reaction occurring within the last 10 years: Unknown If all of the above answers are "NO", then may proceed with Cephalosporin use.   . Sulfa Antibiotics Hives, Shortness Of Breath, Rash and Cough  . Adhesive [Tape] Rash  . Doxycycline Hives, Swelling and Rash  . Sulfamethoxazole-Trimethoprim Rash  . Zithromax [Azithromycin] Hives, Swelling and Rash     Outpatient Medications Prior to Visit  Medication Sig Dispense Refill  . allopurinol (ZYLOPRIM)  100 MG tablet Take 1 tablet (100 mg total) by mouth every morning. (Patient taking differently: Take 100 mg by mouth daily. ) 30 tablet 6  . budesonide (PULMICORT) 0.25 MG/2ML nebulizer solution Take 2 mLs (0.25 mg total) by nebulization 2 (two) times daily. 60 mL 12  . carbamide peroxide (DEBROX) 6.5 % OTIC solution Place 5 drops into both ears 2 (two) times daily. 15 mL 0  . cholecalciferol (VITAMIN D3) 25 MCG (1000 UNIT) tablet Take 1,000 Units by mouth daily.    . cloNIDine (CATAPRES) 0.2 MG tablet Take 1 tablet (0.2 mg total) by mouth 2 (two) times daily. 60 tablet 6  . DULoxetine (CYMBALTA) 30 MG capsule Take 1  capsule (30 mg total) by mouth daily. 30 capsule 5  . furosemide (LASIX) 40 MG tablet Take 1 tablet (40 mg total) by mouth daily. 1 tab po daily 60 tablet 3  . guaiFENesin-dextromethorphan (ROBITUSSIN DM) 100-10 MG/5ML syrup Take 5 mLs by mouth every 4 (four) hours as needed for cough. 118 mL 0  . hydrOXYzine (ATARAX/VISTARIL) 50 MG tablet TAKE ONE TABLET BY MOUTH THREE TIMES DAILY AS NEEDED FOR ANXIETY OR FOR ITCHING (Patient taking differently: Take 50 mg by mouth every 8 (eight) hours as needed for anxiety. ) 60 tablet 1  . isosorbide mononitrate (IMDUR) 30 MG 24 hr tablet Take 1 tablet (30 mg total) by mouth daily. 30 tablet 6  . lisinopril (ZESTRIL) 40 MG tablet Take 40 mg by mouth daily.    . Omega-3 Fatty Acids (FISH OIL) 1000 MG CAPS Take 1 capsule (1,000 mg total) by mouth daily. 90 capsule 0  . pantoprazole (PROTONIX) 40 MG tablet Take 1 tablet (40 mg total) by mouth 2 (two) times daily before a meal. 60 tablet 1  . polyethylene glycol (MIRALAX / GLYCOLAX) 17 g packet Take 17 g by mouth daily as needed. 14 each 0  . senna-docusate (SENOKOT-S) 8.6-50 MG tablet Take 1 tablet by mouth at bedtime as needed for mild constipation. 30 tablet 1  . Vitamin E 100 units TABS Take 1 tablet by mouth daily. 30 tablet 2  . darifenacin (ENABLEX) 7.5 MG 24 hr tablet Take 1 tablet (7.5 mg total)  by mouth daily. 30 tablet 2  . nystatin (MYCOSTATIN/NYSTOP) powder Apply topically 4 (four) times daily. Apply beneath breast and below abdominal fold 60 g 5  . triamcinolone cream (KENALOG) 0.1 % Apply 1 application topically 2 (two) times daily. To rash on back 80 g 1  . albuterol (VENTOLIN HFA) 108 (90 Base) MCG/ACT inhaler Inhale 1-2 puffs into the lungs every 6 (six) hours as needed for wheezing or shortness of breath. 8 g 6  . arformoterol (BROVANA) 15 MCG/2ML NEBU Take 2 mLs (15 mcg total) by nebulization 2 (two) times daily. 120 mL 1  . Collagen-Boron-Hyaluronic Acid ([employer] JOINT HEALTH TRIPLE ACTION PO) Take 1 tablet by mouth daily.    [name] ipratropium-albuterol (DUONEB) 0.5-2.5 (3) MG/3ML SOLN Take 3 mLs by nebulization every 6 (six) hours as needed (SOB). 3 mL 6  . Misc. Devices MISC Please provide patient with a raised toilet seat that is covered under her current insurance plan. 1 each 0  . Misc. Devices MISC Heavy duty shower chair; Heavy duty potty chair. Dx: Osteoarthritis 1 each 0  . vitamin B-12 (CYANOCOBALAMIN) 500 MCG tablet Take 500 mcg by mouth daily.    . benzonatate (TESSALON) 100 MG capsule Take 1 capsule (100 mg total) by mouth 2 (two) times daily. (Patient not taking: Reported on 07/20/2019) 20 capsule 0  . ipratropium-albuterol (DUONEB) 0.5-2.5 (3) MG/3ML SOLN Take 3 mLs by nebulization every 6 (six) hours as needed (SOB). 3 mL 0   No facility-administered medications prior to visit.      Review of Systems  Constitutional: Positive for activity change and fatigue.  HENT: Positive for congestion. Negative for postnasal drip, rhinorrhea, sinus pain and voice change.   Eyes: Negative.   Respiratory: Positive for cough, shortness of breath and wheezing.   Cardiovascular: Positive for chest pain.  Gastrointestinal: Positive for abdominal distention.  Genitourinary: Negative.   Musculoskeletal: Positive for back pain.  Skin: Positive for rash.  Neurological: Negative.     Psychiatric/Behavioral:  Positive for decreased concentration and dysphoric mood. The patient is nervous/anxious.        Objective:   Physical Exam Vitals:   07/20/19 1015  BP: 128/84  Pulse: 81  Resp: (!) 24  Temp: (!) 96.6 F (35.9 C)  SpO2: (!) 88%    Gen: Pleasant, morbidly obese, in no distress,  normal affect  ENT: No lesions,  mouth clear,  oropharynx clear, no postnasal drip  Neck: No JVD, no TMG, no carotid bruits  Lungs: No use of accessory muscles, no dullness to percussion, distant breath sounds few rhonchi at the bases few expired wheezes  Cardiovascular: RRR, heart sounds normal, no murmur or gallops, no peripheral edema  Abdomen: Protuberant nontender no HSM,  BS normal  Musculoskeletal: No deformities, no cyanosis or clubbing  Neuro: alert, non focal  Skin: Diffuse evidence of bedbug bites and infestation on the back and on the anterior chest lower extremities with active cellulitis both left and right lower extremities  All labs x-ray studies from recent admission are reviewed  ABG    Component Value Date/Time   PHART 7.366 06/21/2019 1111   PCO2ART 74.4 (HH) 06/21/2019 1111   PO2ART 110 (H) 06/21/2019 1111   HCO3 41.7 (H) 06/21/2019 1111   TCO2 34 (H) 07/10/2017 2056   O2SAT 98.1 06/21/2019 1111   BMP Latest Ref Rng & Units 06/30/2019 06/29/2019 06/28/2019  Glucose 70 - 99 mg/dL 119(H) 141(H) 240(H)  BUN 6 - 20 mg/dL 37(H) 34(H) 32(H)  Creatinine 0.44 - 1.00 mg/dL 1.63(H) 1.41(H) 1.51(H)  BUN/Creat Ratio 9 - 23 - - -  Sodium 135 - 145 mmol/L 141 140 140  Potassium 3.5 - 5.1 mmol/L 4.4 5.4(H) 4.5  Chloride 98 - 111 mmol/L 94(L) 93(L) 94(L)  CO2 22 - 32 mmol/L 36(H) 37(H) 35(H)  Calcium 8.9 - 10.3 mg/dL 8.3(L) 8.6(L) 8.8(L)   Hepatic Function Latest Ref Rng & Units 06/22/2019 06/21/2019 06/20/2019  Total Protein 6.5 - 8.1 g/dL 6.7 6.8 6.7  Albumin 3.5 - 5.0 g/dL 3.0(L) 2.8(L) 2.8(L)  AST 15 - 41 U/L 14(L) 13(L) 11(L)  ALT 0 - 44 U/L 14 13 11   Alk  Phosphatase 38 - 126 U/L 56 55 51  Total Bilirubin 0.3 - 1.2 mg/dL 0.4 0.5 0.7  Bilirubin, Direct 0.1 - 0.5 mg/dL - - -   CBC Latest Ref Rng & Units 06/24/2019 06/22/2019 06/21/2019  WBC 4.0 - 10.5 K/uL 9.8 8.9 9.6  Hemoglobin 12.0 - 15.0 g/dL 10.6(L) 10.1(L) 10.2(L)  Hematocrit 36.0 - 46.0 % 37.0 35.8(L) 36.0  Platelets 150 - 400 K/uL 326 317 322         Assessment & Plan:  I personally reviewed all images and lab data in the [facility] system as well as any outside material available during this office visit and agree with the  radiology impressions.   Essential hypertension Hypertension under adequate control no change in this for now  Diastolic CHF, acute on chronic (HCC) Acute on chronic right heart failure and diastolic heart failure  No change in current medication profile  Acute on chronic respiratory failure with hypoxia and hypercapnia (HCC) Acute on chronic respiratory failure with both hypoxia and hypercapnia see blood gases from hospitalization  Oxygen saturation today on room air is 88%.  The patient's not able ambulate  We will attempt to get this patient portable oxygen concentrator at a dose of 3 L  Chronic obstructive pulmonary disease with acute lower respiratory infection ([place]) Suspect ongoing lower restaurant tract  infection will administer Levaquin 750 mg daily for 7 days and note this is also to treat the cellulitis  Cellulitis of both lower extremities Cellulitis of both lower extremities well refill triamcinolone and prescribed permethrin for inflammation and begin Levaquin for 7 days 7 or 50 mg daily   Diagnoses and all orders for this visit:  Acute on chronic respiratory failure with hypoxia and hypercapnia (HCC) -     For home use only DME oxygen  Diastolic CHF, acute on chronic (HCC) -     Comprehensive metabolic panel  Cellulitis of both lower extremities -     CBC with Differential/Platelet  OSA (obstructive sleep apnea) -     For home use only  DME oxygen  Exacerbation of asthma, unspecified asthma severity, unspecified whether persistent -     For home use only DME oxygen  Chronic obstructive pulmonary disease with acute lower respiratory infection ([place])  Essential hypertension  Other orders -     darifenacin (ENABLEX) 7.5 MG 24 hr tablet; Take 1 tablet (7.5 mg total) by mouth daily. -     benzonatate (TESSALON) 100 MG capsule; Take 1 capsule (100 mg total) by mouth 2 (two) times daily. -     triamcinolone cream (KENALOG) 0.1 %; Apply 943 application topically 2 (two) times daily. To rash on back -     nystatin (MYCOSTATIN/NYSTOP) powder; Apply topically 4 (four) times daily. Apply beneath breast and below abdominal fold -     permethrin (ELIMITE) 5 % cream; Apply 1 application topically daily. -     levofloxacin (LEVAQUIN) 750 MG tablet; Take 1 tablet (750 mg total) by mouth daily for 7 days. -     acetaminophen-codeine (TYLENOL #3) 300-30 MG tablet; Take 1-2 tablets by mouth every 4 (four) hours as needed for moderate pain.

## 2019-07-20 NOTE — Telephone Encounter (Signed)
Met with the patient at request of Dr Joya Gaskins.  She was interested in [place] but did not want to give up her current back up tank provided by [place].  This CM explained that [employer] does not have POC and she would need to change all of her O2 equipment to [place] who carries POCs. [facility] would also provide her with a new room concentrator and back up tank.  She was very resistant to changing companies.  This was shared with Dr Joya Gaskins who said he would speak with the patient about the need for POC.  Patient is currently staying at the [facility] and is interested in more permanent housing. She receives $794/month disability and pays $600/month rent and also pays for a storage unit. She is willing to pay up to $500/month. This CM provided her with the list of available properties in her price range from [email or web address] This CM also provided her with the phone number for partners ending homelessness coordinated re-entry program and [employer].   She was given a SCAT application to complete and the number to contact DSS to register for medicaid transportation to medical appointments.

## 2019-07-20 NOTE — Assessment & Plan Note (Signed)
Cellulitis of both lower extremities well refill triamcinolone and prescribed permethrin for inflammation and begin Levaquin for 7 days 7 or 50 mg daily

## 2019-07-20 NOTE — Progress Notes (Signed)
HFU- SOB Medication refill -Tessalon PearlesTylenol 3  Request medication refill for bladder control but is not on the list.-

## 2019-07-20 NOTE — Assessment & Plan Note (Signed)
Acute on chronic respiratory failure with both hypoxia and hypercapnia see blood gases from hospitalization  Oxygen saturation today on room air is 88%.  The patient's not able ambulate  We will attempt to get this patient portable oxygen concentrator at a dose of 3 L

## 2019-07-20 NOTE — Patient Instructions (Signed)
Refills on your medications sent to your local pharmacy  We are setting up with a new home oxygen provider [facility] so we will get you a lightweight portable oxygen concentrator system  We have been given information on potentially how to partner with agencies to get improved housing  Permethrin cream was given along with triamcinolone cream to assist with your rash from the insect bites  Take Levaquin 1 daily for 10 days for the infection in your lung and in your legs  A letter was provided for your attorney  Use your bilevel device each night to sleep with and stay on oxygen all the time  Return in follow-up with Dr. Wynetta Emery in 1 month  If you worsen with your breathing go to the emergency room

## 2019-07-20 NOTE — Assessment & Plan Note (Signed)
Suspect ongoing lower restaurant tract infection will administer Levaquin 750 mg daily for 7 days and note this is also to treat the cellulitis

## 2019-07-20 NOTE — Assessment & Plan Note (Signed)
Hypertension under adequate control no change in this for now

## 2019-07-20 NOTE — Assessment & Plan Note (Signed)
Acute on chronic right heart failure and diastolic heart failure  No change in current medication profile

## 2019-07-20 NOTE — Telephone Encounter (Signed)
Not that I can tell.  Does she have an appointment to see somebody in the office?

## 2019-07-21 ENCOUNTER — Telehealth: Payer: Self-pay

## 2019-07-21 NOTE — Telephone Encounter (Signed)
Call placed to [place], spoke to [facility] and informed her that an order has been placed for POC.  She said that would pull the order and info needed to start processing.

## 2019-07-21 NOTE — Telephone Encounter (Signed)
Pt has an appt scheduled for an appt with VS 5/21 for a hospital follow up.

## 2019-07-27 ENCOUNTER — Telehealth: Payer: Self-pay

## 2019-07-27 NOTE — Telephone Encounter (Signed)
Call placed to patient # [phone] regarding SCAT application and to inform her that there is an order for her to have blood work done.  Message left with call back requested to this CM .

## 2019-07-28 NOTE — Telephone Encounter (Signed)
Attempted again  to patient # [phone] regarding SCAT application and to inform her that there is an order for her to have blood work done.  Message left with call back requested to this CM .   CMN for nebulizer faxed to [employer]

## 2019-08-09 ENCOUNTER — Emergency Department (HOSPITAL_COMMUNITY): Payer: Medicaid Other

## 2019-08-09 ENCOUNTER — Inpatient Hospital Stay (HOSPITAL_COMMUNITY)
Admission: EM | Admit: 2019-08-09 | Discharge: 2019-08-16 | DRG: 291 | Disposition: A | Discharge status: Home Health | Payer: Medicaid Other | Attending: Internal Medicine | Admitting: Internal Medicine

## 2019-08-09 ENCOUNTER — Encounter (HOSPITAL_COMMUNITY): Payer: Self-pay

## 2019-08-09 ENCOUNTER — Other Ambulatory Visit: Payer: Self-pay

## 2019-08-09 DIAGNOSIS — I130 Hypertensive heart and chronic kidney disease with heart failure and stage 1 through stage 4 chronic kidney disease, or unspecified chronic kidney disease: Principal | ICD-10-CM | POA: Diagnosis present

## 2019-08-09 DIAGNOSIS — Z6841 Body Mass Index (BMI) 40.0 and over, adult: Secondary | ICD-10-CM

## 2019-08-09 DIAGNOSIS — J45901 Unspecified asthma with (acute) exacerbation: Secondary | ICD-10-CM | POA: Diagnosis present

## 2019-08-09 DIAGNOSIS — W57XXXA Bitten or stung by nonvenomous insect and other nonvenomous arthropods, initial encounter: Secondary | ICD-10-CM | POA: Diagnosis present

## 2019-08-09 DIAGNOSIS — L03115 Cellulitis of right lower limb: Secondary | ICD-10-CM | POA: Diagnosis present

## 2019-08-09 DIAGNOSIS — E785 Hyperlipidemia, unspecified: Secondary | ICD-10-CM | POA: Diagnosis present

## 2019-08-09 DIAGNOSIS — D649 Anemia, unspecified: Secondary | ICD-10-CM | POA: Diagnosis present

## 2019-08-09 DIAGNOSIS — T502X5A Adverse effect of carbonic-anhydrase inhibitors, benzothiadiazides and other diuretics, initial encounter: Secondary | ICD-10-CM | POA: Diagnosis present

## 2019-08-09 DIAGNOSIS — D72828 Other elevated white blood cell count: Secondary | ICD-10-CM | POA: Diagnosis present

## 2019-08-09 DIAGNOSIS — D72829 Elevated white blood cell count, unspecified: Secondary | ICD-10-CM | POA: Diagnosis present

## 2019-08-09 DIAGNOSIS — Z79899 Other long term (current) drug therapy: Secondary | ICD-10-CM

## 2019-08-09 DIAGNOSIS — Z9981 Dependence on supplemental oxygen: Secondary | ICD-10-CM

## 2019-08-09 DIAGNOSIS — J810 Acute pulmonary edema: Secondary | ICD-10-CM

## 2019-08-09 DIAGNOSIS — Z7951 Long term (current) use of inhaled steroids: Secondary | ICD-10-CM

## 2019-08-09 DIAGNOSIS — L0390 Cellulitis, unspecified: Secondary | ICD-10-CM | POA: Diagnosis present

## 2019-08-09 DIAGNOSIS — I5033 Acute on chronic diastolic (congestive) heart failure: Secondary | ICD-10-CM

## 2019-08-09 DIAGNOSIS — Z20822 Contact with and (suspected) exposure to covid-19: Secondary | ICD-10-CM | POA: Diagnosis present

## 2019-08-09 DIAGNOSIS — N183 Chronic kidney disease, stage 3 unspecified: Secondary | ICD-10-CM | POA: Diagnosis present

## 2019-08-09 DIAGNOSIS — Z207 Contact with and (suspected) exposure to pediculosis, acariasis and other infestations: Secondary | ICD-10-CM | POA: Diagnosis present

## 2019-08-09 DIAGNOSIS — R0902 Hypoxemia: Secondary | ICD-10-CM

## 2019-08-09 DIAGNOSIS — L089 Local infection of the skin and subcutaneous tissue, unspecified: Secondary | ICD-10-CM | POA: Diagnosis present

## 2019-08-09 DIAGNOSIS — F419 Anxiety disorder, unspecified: Secondary | ICD-10-CM | POA: Diagnosis present

## 2019-08-09 DIAGNOSIS — Z9119 Patient's noncompliance with other medical treatment and regimen: Secondary | ICD-10-CM

## 2019-08-09 DIAGNOSIS — J9621 Acute and chronic respiratory failure with hypoxia: Secondary | ICD-10-CM | POA: Diagnosis present

## 2019-08-09 DIAGNOSIS — Z9111 Patient's noncompliance with dietary regimen: Secondary | ICD-10-CM

## 2019-08-09 DIAGNOSIS — G4733 Obstructive sleep apnea (adult) (pediatric): Secondary | ICD-10-CM | POA: Diagnosis present

## 2019-08-09 DIAGNOSIS — N1832 Chronic kidney disease, stage 3b: Secondary | ICD-10-CM | POA: Diagnosis present

## 2019-08-09 DIAGNOSIS — K219 Gastro-esophageal reflux disease without esophagitis: Secondary | ICD-10-CM | POA: Diagnosis present

## 2019-08-09 DIAGNOSIS — J440 Chronic obstructive pulmonary disease with acute lower respiratory infection: Secondary | ICD-10-CM

## 2019-08-09 DIAGNOSIS — J9622 Acute and chronic respiratory failure with hypercapnia: Secondary | ICD-10-CM | POA: Diagnosis present

## 2019-08-09 DIAGNOSIS — N179 Acute kidney failure, unspecified: Secondary | ICD-10-CM | POA: Diagnosis present

## 2019-08-09 DIAGNOSIS — Z833 Family history of diabetes mellitus: Secondary | ICD-10-CM

## 2019-08-09 DIAGNOSIS — B372 Candidiasis of skin and nail: Secondary | ICD-10-CM | POA: Diagnosis present

## 2019-08-09 DIAGNOSIS — J9601 Acute respiratory failure with hypoxia: Secondary | ICD-10-CM

## 2019-08-09 DIAGNOSIS — Z87891 Personal history of nicotine dependence: Secondary | ICD-10-CM

## 2019-08-09 DIAGNOSIS — E875 Hyperkalemia: Secondary | ICD-10-CM

## 2019-08-09 LAB — CBC
HCT: 40 % (ref 36.0–46.0)
Hemoglobin: 11.2 g/dL — ABNORMAL LOW (ref 12.0–15.0)
MCH: 27.6 pg (ref 26.0–34.0)
MCHC: 28 g/dL — ABNORMAL LOW (ref 30.0–36.0)
MCV: 98.5 fL (ref 80.0–100.0)
Platelets: 317 10*3/uL (ref 150–400)
RBC: 4.06 MIL/uL (ref 3.87–5.11)
RDW: 15.8 % — ABNORMAL HIGH (ref 11.5–15.5)
WBC: 11.9 10*3/uL — ABNORMAL HIGH (ref 4.0–10.5)
nRBC: 0 % (ref 0.0–0.2)

## 2019-08-09 LAB — BASIC METABOLIC PANEL
Anion gap: 9 (ref 5–15)
BUN: 29 mg/dL — ABNORMAL HIGH (ref 6–20)
CO2: 31 mmol/L (ref 22–32)
Calcium: 8.7 mg/dL — ABNORMAL LOW (ref 8.9–10.3)
Chloride: 103 mmol/L (ref 98–111)
Creatinine, Ser: 1.58 mg/dL — ABNORMAL HIGH (ref 0.44–1.00)
GFR calc Af Amer: 42 mL/min — ABNORMAL LOW (ref >60)
GFR calc non Af Amer: 36 mL/min — ABNORMAL LOW (ref >60)
Glucose, Bld: 97 mg/dL (ref 70–99)
Potassium: 6.2 mmol/L — ABNORMAL HIGH (ref 3.5–5.1)
Sodium: 143 mmol/L (ref 135–145)

## 2019-08-09 LAB — I-STAT BETA HCG BLOOD, ED (MC, WL, AP ONLY): I-stat hCG, quantitative: <5 m[IU]/mL (ref <5)

## 2019-08-09 LAB — TROPONIN I (HIGH SENSITIVITY): Troponin I (High Sensitivity): 4 ng/L (ref <18)

## 2019-08-09 MED ORDER — SODIUM CHLORIDE 0.9% FLUSH: 3.0000 mL | Freq: Once | INTRAVENOUS | Status: DC

## 2019-08-09 NOTE — ED Triage Notes (Signed)
Pt arrives to ED w/ c/o central located, non-radiating, 10/10 chest pain and sob. Pt also c/o RLE pain.

## 2019-08-10 ENCOUNTER — Inpatient Hospital Stay (HOSPITAL_COMMUNITY)
Admit: 2019-08-10 | Discharge: 2019-08-10 | Disposition: A | Discharge status: Home / Self Care | Payer: Medicaid Other | Attending: Internal Medicine | Admitting: Internal Medicine

## 2019-08-10 ENCOUNTER — Inpatient Hospital Stay (HOSPITAL_COMMUNITY): Payer: Medicaid Other

## 2019-08-10 DIAGNOSIS — E785 Hyperlipidemia, unspecified: Secondary | ICD-10-CM

## 2019-08-10 DIAGNOSIS — J45901 Unspecified asthma with (acute) exacerbation: Secondary | ICD-10-CM | POA: Diagnosis present

## 2019-08-10 DIAGNOSIS — I130 Hypertensive heart and chronic kidney disease with heart failure and stage 1 through stage 4 chronic kidney disease, or unspecified chronic kidney disease: Secondary | ICD-10-CM | POA: Diagnosis present

## 2019-08-10 DIAGNOSIS — Z7951 Long term (current) use of inhaled steroids: Secondary | ICD-10-CM | POA: Diagnosis not present

## 2019-08-10 DIAGNOSIS — E875 Hyperkalemia: Secondary | ICD-10-CM | POA: Diagnosis present

## 2019-08-10 DIAGNOSIS — G4733 Obstructive sleep apnea (adult) (pediatric): Secondary | ICD-10-CM

## 2019-08-10 DIAGNOSIS — Z79899 Other long term (current) drug therapy: Secondary | ICD-10-CM | POA: Diagnosis not present

## 2019-08-10 DIAGNOSIS — Z20822 Contact with and (suspected) exposure to covid-19: Secondary | ICD-10-CM | POA: Diagnosis present

## 2019-08-10 DIAGNOSIS — B372 Candidiasis of skin and nail: Secondary | ICD-10-CM | POA: Diagnosis present

## 2019-08-10 DIAGNOSIS — F419 Anxiety disorder, unspecified: Secondary | ICD-10-CM | POA: Diagnosis not present

## 2019-08-10 DIAGNOSIS — W57XXXA Bitten or stung by nonvenomous insect and other nonvenomous arthropods, initial encounter: Secondary | ICD-10-CM | POA: Diagnosis present

## 2019-08-10 DIAGNOSIS — Z9119 Patient's noncompliance with other medical treatment and regimen: Secondary | ICD-10-CM | POA: Diagnosis not present

## 2019-08-10 DIAGNOSIS — Z9111 Patient's noncompliance with dietary regimen: Secondary | ICD-10-CM | POA: Diagnosis not present

## 2019-08-10 DIAGNOSIS — Z207 Contact with and (suspected) exposure to pediculosis, acariasis and other infestations: Secondary | ICD-10-CM | POA: Diagnosis present

## 2019-08-10 DIAGNOSIS — D72829 Elevated white blood cell count, unspecified: Secondary | ICD-10-CM

## 2019-08-10 DIAGNOSIS — J9601 Acute respiratory failure with hypoxia: Secondary | ICD-10-CM | POA: Diagnosis not present

## 2019-08-10 DIAGNOSIS — R52 Pain, unspecified: Secondary | ICD-10-CM

## 2019-08-10 DIAGNOSIS — N1832 Chronic kidney disease, stage 3b: Secondary | ICD-10-CM

## 2019-08-10 DIAGNOSIS — L03115 Cellulitis of right lower limb: Secondary | ICD-10-CM | POA: Diagnosis present

## 2019-08-10 DIAGNOSIS — Z6841 Body Mass Index (BMI) 40.0 and over, adult: Secondary | ICD-10-CM

## 2019-08-10 DIAGNOSIS — J9622 Acute and chronic respiratory failure with hypercapnia: Secondary | ICD-10-CM | POA: Diagnosis present

## 2019-08-10 DIAGNOSIS — Z87891 Personal history of nicotine dependence: Secondary | ICD-10-CM | POA: Diagnosis not present

## 2019-08-10 DIAGNOSIS — J9621 Acute and chronic respiratory failure with hypoxia: Secondary | ICD-10-CM | POA: Diagnosis present

## 2019-08-10 DIAGNOSIS — N179 Acute kidney failure, unspecified: Secondary | ICD-10-CM | POA: Diagnosis present

## 2019-08-10 DIAGNOSIS — J810 Acute pulmonary edema: Secondary | ICD-10-CM | POA: Diagnosis not present

## 2019-08-10 DIAGNOSIS — R0902 Hypoxemia: Secondary | ICD-10-CM | POA: Diagnosis not present

## 2019-08-10 DIAGNOSIS — L0889 Other specified local infections of the skin and subcutaneous tissue: Secondary | ICD-10-CM

## 2019-08-10 DIAGNOSIS — K219 Gastro-esophageal reflux disease without esophagitis: Secondary | ICD-10-CM | POA: Diagnosis present

## 2019-08-10 DIAGNOSIS — T148XXA Other injury of unspecified body region, initial encounter: Secondary | ICD-10-CM | POA: Diagnosis present

## 2019-08-10 DIAGNOSIS — Z833 Family history of diabetes mellitus: Secondary | ICD-10-CM | POA: Diagnosis not present

## 2019-08-10 DIAGNOSIS — I5033 Acute on chronic diastolic (congestive) heart failure: Secondary | ICD-10-CM | POA: Diagnosis present

## 2019-08-10 DIAGNOSIS — Z9981 Dependence on supplemental oxygen: Secondary | ICD-10-CM | POA: Diagnosis not present

## 2019-08-10 DIAGNOSIS — R0602 Shortness of breath: Secondary | ICD-10-CM | POA: Diagnosis present

## 2019-08-10 LAB — POTASSIUM: Potassium: 5.6 mmol/L — ABNORMAL HIGH (ref 3.5–5.1)

## 2019-08-10 LAB — URINALYSIS, ROUTINE W REFLEX MICROSCOPIC
Bilirubin Urine: NEGATIVE
Glucose, UA: NEGATIVE mg/dL
Ketones, ur: NEGATIVE mg/dL
Nitrite: NEGATIVE
Protein, ur: NEGATIVE mg/dL
Specific Gravity, Urine: 1.006 (ref 1.005–1.030)
pH: 6 (ref 5.0–8.0)

## 2019-08-10 LAB — TROPONIN I (HIGH SENSITIVITY): Troponin I (High Sensitivity): 3 ng/L (ref <18)

## 2019-08-10 LAB — RAPID URINE DRUG SCREEN, HOSP PERFORMED
Amphetamines: NOT DETECTED
Barbiturates: NOT DETECTED
Benzodiazepines: NOT DETECTED
Cocaine: NOT DETECTED
Opiates: NOT DETECTED
Tetrahydrocannabinol: NOT DETECTED

## 2019-08-10 LAB — HEPARIN LEVEL (UNFRACTIONATED): Heparin Unfractionated: 0.22 IU/mL — ABNORMAL LOW (ref 0.30–0.70)

## 2019-08-10 LAB — BRAIN NATRIURETIC PEPTIDE: B Natriuretic Peptide: 60.1 pg/mL (ref 0.0–100.0)

## 2019-08-10 LAB — SARS CORONAVIRUS 2 (TAT 6-24 HRS): SARS Coronavirus 2: NEGATIVE

## 2019-08-10 LAB — C-REACTIVE PROTEIN: CRP: 5.6 mg/dL — ABNORMAL HIGH (ref <1.0)

## 2019-08-10 IMAGING — CR DG CHEST 2V
2 series · 2 of 2 positions shown · non-contrast
Comparison: 10/01/2016

CLINICAL DATA: Dyspnea, hypertension.

EXAM:
CHEST  2 VIEW

[w chest lat]
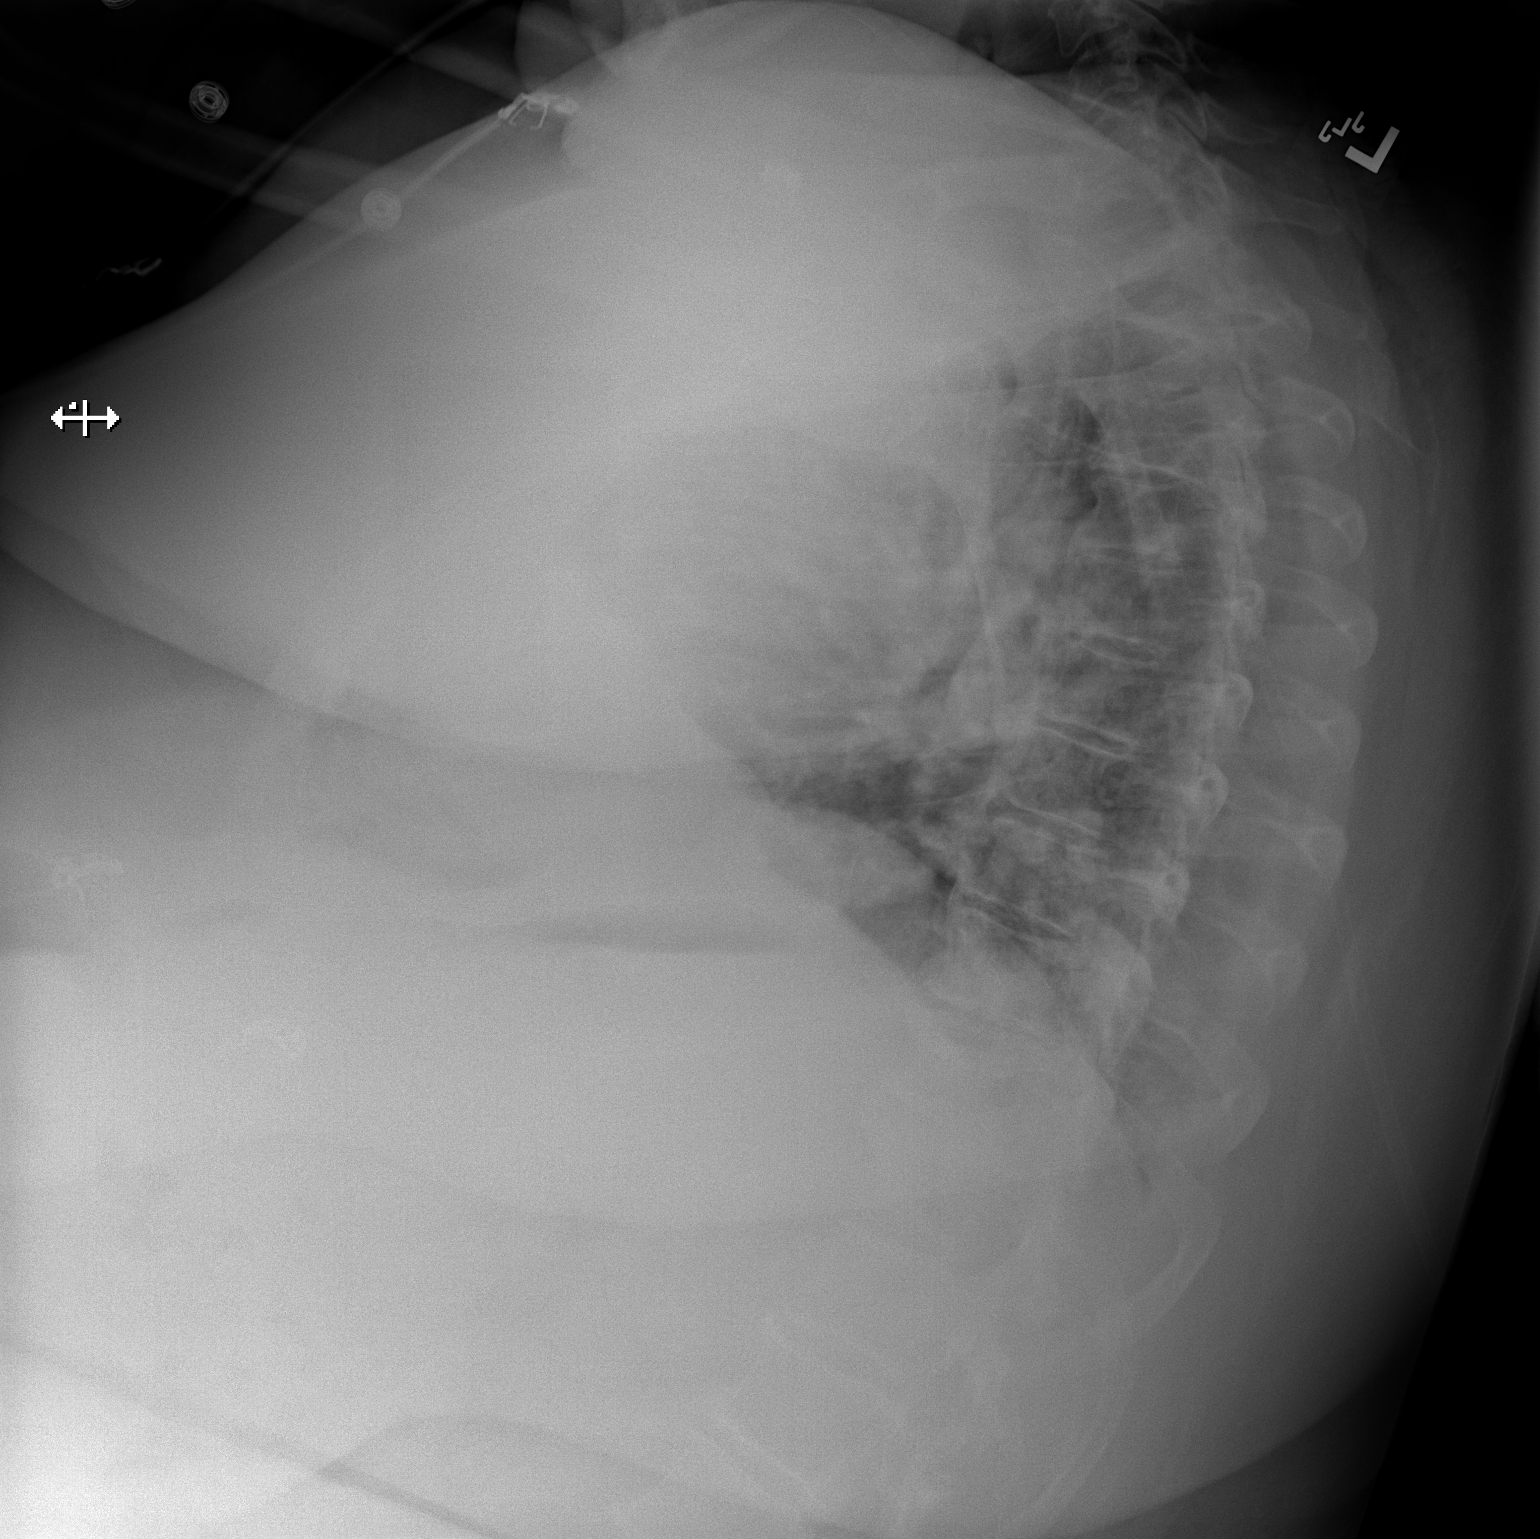

[x chest ap]
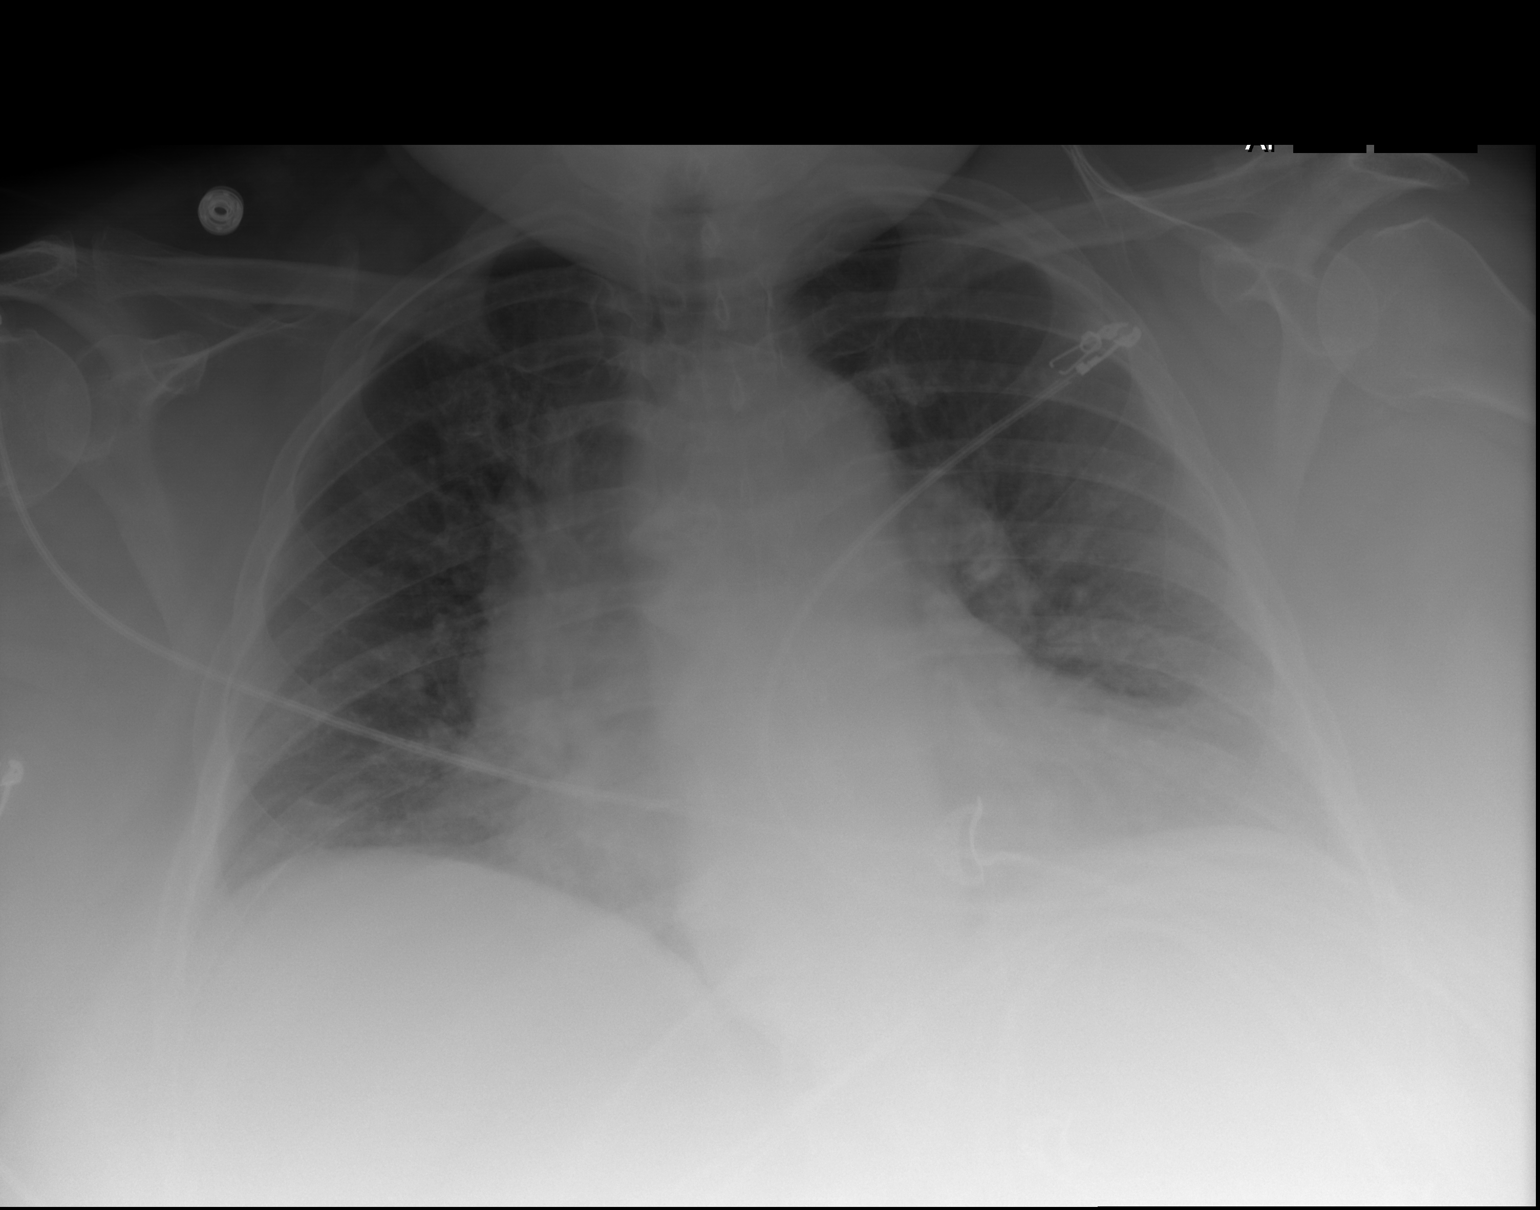

[2 of 2 positions shown; findings below may reference images not displayed]

FINDINGS: Stable cardiomegaly with central vascular congestion and low lung
volumes similar to prior. No pneumonic consolidation, effusion or
pneumothorax. No acute nor suspicious osseous abnormality.
IMPRESSION: Stable cardiomegaly with central vascular congestion. No significant
change from prior.

## 2019-08-10 MED ORDER — POLYETHYLENE GLYCOL 3350 17 G PO PACK: 17.0000 g | PACK | Freq: Every day | ORAL | Status: DC | PRN

## 2019-08-10 MED ORDER — LISINOPRIL 40 MG PO TABS
40.0000 mg | ORAL_TABLET | Freq: Every day | ORAL | Status: DC
  Administered 2019-08-10 – 2019-08-11 (×2): 40 mg via ORAL
  Filled 2019-08-10: qty 1
  Filled 2019-08-10: qty 2

## 2019-08-10 MED ORDER — SODIUM CHLORIDE 0.9 % IV SOLN
2.0000 g | INTRAVENOUS | Status: AC
  Administered 2019-08-10 – 2019-08-14 (×5): 2 g via INTRAVENOUS
  Filled 2019-08-10: qty 2
  Filled 2019-08-10: qty 20
  Filled 2019-08-10 (×3): qty 2

## 2019-08-10 MED ORDER — ACETAMINOPHEN-CODEINE #3 300-30 MG PO TABS
1.0000 | ORAL_TABLET | ORAL | Status: DC | PRN
  Administered 2019-08-10 – 2019-08-11 (×6): 2 via ORAL
  Administered 2019-08-11: 1 via ORAL
  Administered 2019-08-12 – 2019-08-13 (×7): 2 via ORAL
  Administered 2019-08-14: 1 via ORAL
  Administered 2019-08-15: 2 via ORAL
  Administered 2019-08-15 (×2): 1 via ORAL
  Administered 2019-08-15: 2 via ORAL
  Administered 2019-08-16: 1 via ORAL
  Filled 2019-08-10 (×2): qty 2
  Filled 2019-08-10: qty 1
  Filled 2019-08-10 (×7): qty 2
  Filled 2019-08-10: qty 1
  Filled 2019-08-10 (×4): qty 2
  Filled 2019-08-10: qty 1
  Filled 2019-08-10 (×2): qty 2
  Filled 2019-08-10 (×2): qty 1

## 2019-08-10 MED ORDER — SODIUM CHLORIDE 0.9 % IV SOLN
250.0000 mL | INTRAVENOUS | Status: DC | PRN
  Administered 2019-08-10 – 2019-08-11 (×2): 250 mL via INTRAVENOUS

## 2019-08-10 MED ORDER — FUROSEMIDE 10 MG/ML IJ SOLN
40.0000 mg | Freq: Two times a day (BID) | INTRAMUSCULAR | Status: DC
  Administered 2019-08-10 – 2019-08-15 (×10): 40 mg via INTRAVENOUS
  Filled 2019-08-10 (×10): qty 4

## 2019-08-10 MED ORDER — DARIFENACIN HYDROBROMIDE ER 7.5 MG PO TB24
7.5000 mg | ORAL_TABLET | Freq: Every day | ORAL | Status: DC
  Administered 2019-08-10 – 2019-08-16 (×7): 7.5 mg via ORAL
  Filled 2019-08-10 (×8): qty 1

## 2019-08-10 MED ORDER — SENNOSIDES-DOCUSATE SODIUM 8.6-50 MG PO TABS: 1.0000 | ORAL_TABLET | Freq: Every evening | ORAL | Status: DC | PRN

## 2019-08-10 MED ORDER — GUAIFENESIN 100 MG/5ML PO SOLN
5.0000 mL | Freq: Once | ORAL | Status: AC
  Administered 2019-08-10: 100 mg via ORAL
  Filled 2019-08-10: qty 5

## 2019-08-10 MED ORDER — ONDANSETRON HCL 4 MG/2ML IJ SOLN: 4.0000 mg | Freq: Four times a day (QID) | INTRAMUSCULAR | Status: DC | PRN

## 2019-08-10 MED ORDER — BENZONATATE 100 MG PO CAPS
200.0000 mg | ORAL_CAPSULE | Freq: Three times a day (TID) | ORAL | Status: DC | PRN
  Administered 2019-08-11 – 2019-08-13 (×4): 200 mg via ORAL
  Filled 2019-08-10 (×5): qty 2

## 2019-08-10 MED ORDER — IPRATROPIUM-ALBUTEROL 0.5-2.5 (3) MG/3ML IN SOLN: 3.0000 mL | Freq: Four times a day (QID) | RESPIRATORY_TRACT | Status: DC | PRN

## 2019-08-10 MED ORDER — PREDNISONE 20 MG PO TABS
40.0000 mg | ORAL_TABLET | Freq: Every day | ORAL | Status: DC
  Administered 2019-08-11 – 2019-08-12 (×2): 40 mg via ORAL
  Filled 2019-08-10 (×2): qty 2

## 2019-08-10 MED ORDER — ENOXAPARIN SODIUM 100 MG/ML ~~LOC~~ SOLN
90.0000 mg | SUBCUTANEOUS | Status: DC
  Filled 2019-08-10 (×3): qty 1

## 2019-08-10 MED ORDER — PERMETHRIN 5 % EX CREA
TOPICAL_CREAM | Freq: Once | CUTANEOUS | Status: AC
  Administered 2019-08-10: 1 via TOPICAL
  Filled 2019-08-10: qty 60

## 2019-08-10 MED ORDER — ALLOPURINOL 100 MG PO TABS
100.0000 mg | ORAL_TABLET | Freq: Every day | ORAL | Status: DC
  Administered 2019-08-10 – 2019-08-16 (×7): 100 mg via ORAL
  Filled 2019-08-10 (×7): qty 1

## 2019-08-10 MED ORDER — NYSTATIN 100000 UNIT/GM EX POWD
Freq: Two times a day (BID) | CUTANEOUS | Status: DC
  Filled 2019-08-10: qty 15

## 2019-08-10 MED ORDER — ALBUTEROL SULFATE HFA 108 (90 BASE) MCG/ACT IN AERS
2.0000 | INHALATION_SPRAY | Freq: Once | RESPIRATORY_TRACT | Status: AC
  Administered 2019-08-10: 2 via RESPIRATORY_TRACT
  Filled 2019-08-10: qty 6.7

## 2019-08-10 MED ORDER — ACETAMINOPHEN 325 MG PO TABS: 650.0000 mg | ORAL_TABLET | ORAL | Status: DC | PRN

## 2019-08-10 MED ORDER — CLONIDINE HCL 0.2 MG PO TABS
0.2000 mg | ORAL_TABLET | Freq: Two times a day (BID) | ORAL | Status: DC
  Administered 2019-08-10 – 2019-08-11 (×3): 0.2 mg via ORAL
  Filled 2019-08-10 (×3): qty 1

## 2019-08-10 MED ORDER — ARFORMOTEROL TARTRATE 15 MCG/2ML IN NEBU
15.0000 ug | INHALATION_SOLUTION | Freq: Two times a day (BID) | RESPIRATORY_TRACT | Status: DC
  Administered 2019-08-10 – 2019-08-16 (×12): 15 ug via RESPIRATORY_TRACT
  Filled 2019-08-10 (×14): qty 2

## 2019-08-10 MED ORDER — PANTOPRAZOLE SODIUM 40 MG PO TBEC
40.0000 mg | DELAYED_RELEASE_TABLET | Freq: Two times a day (BID) | ORAL | Status: DC
  Administered 2019-08-10 – 2019-08-16 (×13): 40 mg via ORAL
  Filled 2019-08-10 (×13): qty 1

## 2019-08-10 MED ORDER — OXYCODONE-ACETAMINOPHEN 5-325 MG PO TABS
2.0000 | ORAL_TABLET | Freq: Once | ORAL | Status: AC
  Administered 2019-08-10: 2 via ORAL
  Filled 2019-08-10: qty 2

## 2019-08-10 MED ORDER — FUROSEMIDE 10 MG/ML IJ SOLN
80.0000 mg | Freq: Once | INTRAMUSCULAR | Status: AC
  Administered 2019-08-10: 80 mg via INTRAVENOUS
  Filled 2019-08-10: qty 8

## 2019-08-10 MED ORDER — HYDROXYZINE HCL 25 MG PO TABS
50.0000 mg | ORAL_TABLET | Freq: Three times a day (TID) | ORAL | Status: DC | PRN
  Administered 2019-08-11 – 2019-08-16 (×9): 50 mg via ORAL
  Filled 2019-08-10 (×11): qty 2

## 2019-08-10 MED ORDER — KETOCONAZOLE 2 % EX SHAM
MEDICATED_SHAMPOO | CUTANEOUS | Status: DC
  Filled 2019-08-10: qty 120

## 2019-08-10 MED ORDER — PREDNISONE 20 MG PO TABS
60.0000 mg | ORAL_TABLET | Freq: Once | ORAL | Status: AC
  Administered 2019-08-10: 60 mg via ORAL
  Filled 2019-08-10: qty 3

## 2019-08-10 MED ORDER — BUDESONIDE 0.25 MG/2ML IN SUSP
0.2500 mg | Freq: Two times a day (BID) | RESPIRATORY_TRACT | Status: DC
  Administered 2019-08-10 – 2019-08-16 (×12): 0.25 mg via RESPIRATORY_TRACT
  Filled 2019-08-10 (×14): qty 2

## 2019-08-10 MED ORDER — ENOXAPARIN SODIUM 40 MG/0.4ML ~~LOC~~ SOLN: 40.0000 mg | SUBCUTANEOUS | Status: DC

## 2019-08-10 MED ORDER — HEPARIN (PORCINE) 25000 UT/250ML-% IV SOLN
1800.0000 [IU]/h | INTRAVENOUS | Status: DC
  Administered 2019-08-10: 1800 [IU]/h via INTRAVENOUS
  Filled 2019-08-10: qty 250

## 2019-08-10 MED ORDER — TECHNETIUM TO 99M ALBUMIN AGGREGATED
2.0000 | Freq: Once | INTRAVENOUS | Status: AC | PRN
  Administered 2019-08-10: 1.65 via INTRAVENOUS

## 2019-08-10 MED ORDER — SODIUM CHLORIDE 0.9% FLUSH: 3.0000 mL | INTRAVENOUS | Status: DC | PRN

## 2019-08-10 MED ORDER — IPRATROPIUM-ALBUTEROL 0.5-2.5 (3) MG/3ML IN SOLN
3.0000 mL | Freq: Four times a day (QID) | RESPIRATORY_TRACT | Status: DC
  Administered 2019-08-10 – 2019-08-12 (×6): 3 mL via RESPIRATORY_TRACT
  Filled 2019-08-10 (×6): qty 3

## 2019-08-10 MED ORDER — ISOSORBIDE MONONITRATE ER 30 MG PO TB24
30.0000 mg | ORAL_TABLET | Freq: Every day | ORAL | Status: DC
  Administered 2019-08-10 – 2019-08-15 (×6): 30 mg via ORAL
  Filled 2019-08-10 (×6): qty 1

## 2019-08-10 MED ORDER — HEPARIN BOLUS VIA INFUSION
5500.0000 [IU] | Freq: Once | INTRAVENOUS | Status: AC
  Administered 2019-08-10: 5500 [IU] via INTRAVENOUS
  Filled 2019-08-10: qty 5500

## 2019-08-10 MED ORDER — SODIUM CHLORIDE 0.9% FLUSH
3.0000 mL | Freq: Two times a day (BID) | INTRAVENOUS | Status: DC
  Administered 2019-08-10 – 2019-08-16 (×11): 3 mL via INTRAVENOUS

## 2019-08-10 MED ORDER — AEROCHAMBER PLUS FLO-VU LARGE MISC
1.0000 | Freq: Once | Status: AC
  Administered 2019-08-10: 1

## 2019-08-10 MED ORDER — AEROCHAMBER PLUS FLO-VU LARGE MISC
Status: AC
  Filled 2019-08-10: qty 1

## 2019-08-10 MED ORDER — DULOXETINE HCL 30 MG PO CPEP
30.0000 mg | ORAL_CAPSULE | Freq: Every day | ORAL | Status: DC
  Administered 2019-08-10 – 2019-08-16 (×7): 30 mg via ORAL
  Filled 2019-08-10 (×7): qty 1

## 2019-08-10 NOTE — Progress Notes (Addendum)
ANTICOAGULATION CONSULT NOTE - Initial Consult  Pharmacy Consult for heparin >> enoxaparin  Indication: VTE ppx   Allergies  Allergen Reactions  . Ibuprofen Hives, Shortness Of Breath, Palpitations and Rash    Has tolerated toradol 12/2013 inpatient  . Penicillins Anaphylaxis, Hives and Rash    Has patient had a PCN reaction causing immediate rash, facial/tongue/throat swelling, SOB or lightheadedness with hypotension: Yes Has patient had a PCN reaction causing severe rash involving mucus membranes or skin necrosis: Yes Has patient had a PCN reaction that required hospitalization Unknown Has patient had a PCN reaction occurring within the last 10 years: Unknown If all of the above answers are "NO", then may proceed with Cephalosporin use.   . Sulfa Antibiotics Hives, Shortness Of Breath, Rash and Cough  . Adhesive [Tape] Rash  . Doxycycline Hives, Swelling and Rash  . Sulfamethoxazole-Trimethoprim Rash  . Zithromax [Azithromycin] Hives, Swelling and Rash    Patient Measurements: Height: 5\' 4"  (162.6 cm) Weight: (!) 180.1 kg (397 lb 0.8 oz)(bed) IBW/kg (Calculated) : 54.7  Vital Signs: Temp: 98.1 F (36.7 C) (05/19 1640) Temp Source: Oral (05/19 1640) BP: 104/54 (05/19 1640) Pulse Rate: 71 (05/19 1640)  Labs: Recent Labs    08/09/19 2201 08/10/19 0004 08/10/19 1644  HGB 11.2*  --   --   HCT 40.0  --   --   PLT 317  --   --   HEPARINUNFRC  --   --  0.22*  CREATININE 1.58*  --   --   TROPONINIHS 4 3  --     Assessment: 43 YOF presenting with SOB, no PE seen on CT however severely limited d/t body habitus. Not on anticoagulation PTA.    First HL 0.22 subtherapeutic. Nuclear med scan negative for PE. Ultrasound negative for BL DVT. MD DC'd heparin and requested enoxaparin prophylaxis dosing. BMI > 30, will give 0.5mg /kg for prophylaxis.  Goal of Therapy:  Monitor platelets by anticoagulation protocol: Yes   Plan:  Stop heparin infusion Start enoxaparin 90mg  q 24  hr for prophylaxis Monitor for signs/symptoms of bleeding   Benetta Spar, PharmD, BCPS, BCCP Clinical Pharmacist  Please check AMION for all [facility] phone numbers After 10:00 PM, call [place] [phone]

## 2019-08-10 NOTE — ED Provider Notes (Signed)
[facility] EMERGENCY DEPARTMENT Provider Note   CSN: [id number] Arrival date & time: 08/09/19  2113     History Chief Complaint  Patient presents with  . Chest Pain    Kara Vincent is a 54 y.o. female.  The history is provided by the patient.  Shortness of Breath Severity:  Moderate Onset quality:  Gradual Timing:  Intermittent Progression:  Worsening Chronicity:  New Relieved by:  Nothing Worsened by:  Nothing Associated symptoms: chest pain, cough, rash and sputum production   Associated symptoms: no fever, no hemoptysis and no vomiting   Patient with history of morbid obesity, CHF, chronic respiratory failure, chronic pain presents with shortness of breath.  Patient reports for the past several days has had increasing shortness of breath.  She also reports cough with yellowish sputum.  No hemoptysis.  No fevers or vomiting.  She reports chest wall pain with coughing.  She also reports increased leg swelling.  She also think she has cellulitis of her legs.  She is having difficulty walking due to pain and shortness of breath. She is on home oxygen She also reports multiple rashes that she would like diagnosed.    Past Medical History:  Diagnosis Date  . Allergy   . Anxiety   . Arthritis   . Asthma   . Asthma   . CHF (congestive heart failure) ([place])   . CHF (congestive heart failure) ([place])   . Chronic abdominal pain   . Chronic kidney disease   . Chronic pain    "all over"  . 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 GERD (gastroesophageal reflux disease)   . Gout   . Hidradenitis   . HLD (hyperlipidemia) 12/14/2015  . Homelessness   . Hyperlipemia   . Hypertension   . Low back pain   . Morbid obesity ([place])    uses motor wheel chair  . MRSA (methicillin resistant Staphylococcus aureus)    states about a year ago  . On home O2    3L N/C O2 continuously  . OSA (obstructive sleep apnea)    non-compliant with CPAP  . Oxygen  deficiency   . Renal insufficiency   . Sleep apnea     Patient Active Problem List   Diagnosis Date Noted  . Chronic obstructive pulmonary disease with acute lower respiratory infection ([place]) 07/20/2019  . Cellulitis of both lower extremities 06/14/2019  . BMI 60.0-69.9, adult ([place]) 12/01/2017  . Primary osteoarthritis of both knees 12/01/2017  . Anxiety 12/01/2017  . Impaired mobility 12/01/2017  . Essential hypertension 12/14/2015  . HLD (hyperlipidemia) 12/14/2015  . GERD (gastroesophageal reflux disease) 12/14/2015  . Gout 12/14/2015  . Diastolic CHF, acute on chronic (H[facility]) 12/14/2015  . Darier's disease   . Right-sided heart failure ([place]) 11/20/2014  . OSA (obstructive sleep apnea)   . CKD (chronic kidney disease) stage 3, GFR 30-59 ml/min (H[facility]) 11/17/2014  . Venous stasis 11/16/2014  . Acute on chronic respiratory failure with hypoxia and hypercapnia ([employer]) 11/16/2014  . Chronic pain 08/24/2011  . Obesity hypoventilation syndrome ([place]) 08/24/2011    Past Surgical History:  Procedure Laterality Date  . BACK SURGERY    . CESAREAN SECTION    . CESAREAN SECTION    . FRACTURE SURGERY    . TUBAL LIGATION       OB History    Gravida  0   Para  0   Term  0   Preterm  0   AB  0   Living        SAB  0   TAB  0   Ectopic  0   Multiple      Live Births              Family History  Problem Relation Age of Onset  . Asthma Mother   . Diabetes Father   . Stroke Father   . Heart disease Father   . Arthritis Sister   . Asthma Sister   . Asthma Daughter   . Asthma Son   . Arthritis Sister   . Asthma Sister     Social History   Tobacco Use  . Smoking status: Former Smoker    Types: Cigarettes    Quit date: 03/25/2003    Years since quitting: 16.3  . Smokeless tobacco: Former Systems developer    Quit date: 05/27/1978  Substance Use Topics  . Alcohol use: Yes    Alcohol/week: 0.0 standard drinks    Comment: years ago  no longer beer only  early 20's  . Drug  use: Yes    Types: "Crack" cocaine, Other-see comments    Comment: years  ago    Home Medications Prior to Admission medications   Medication Sig Start Date End Date Taking? Authorizing Provider  acetaminophen-codeine (TYLENOL #3) 300-30 MG tablet Take 1-2 tablets by mouth every 4 (four) hours as needed for moderate pain. 07/20/19   Elsie Stain, MD  albuterol (VENTOLIN HFA) 108 (90 Base) MCG/ACT inhaler Inhale 1-2 puffs into the lungs every 6 (six) hours as needed for wheezing or shortness of breath. 11/22/18 06/13/28  Ladell Pier, MD  allopurinol (ZYLOPRIM) 100 MG tablet Take 1 tablet (100 mg total) by mouth every morning. Patient taking differently: Take 100 mg by mouth daily.  11/22/18   Ladell Pier, MD  arformoterol (BROVANA) 15 MCG/2ML NEBU Take 2 mLs (15 mcg total) by nebulization 2 (two) times daily. 06/29/19   Hosie Poisson, MD  benzonatate (TESSALON) 100 MG capsule Take 1 capsule (100 mg total) by mouth 2 (two) times daily. 07/20/19   Elsie Stain, MD  budesonide (PULMICORT) 0.25 MG/2ML nebulizer solution Take 2 mLs (0.25 mg total) by nebulization 2 (two) times daily. 06/29/19   Hosie Poisson, MD  carbamide peroxide (DEBROX) 6.5 % OTIC solution Place 5 drops into both ears 2 (two) times daily. 06/30/19   Hosie Poisson, MD  cholecalciferol (VITAMIN D3) 25 MCG (1000 UNIT) tablet Take 1,000 Units by mouth daily.    [provider]  cloNIDine (CATAPRES) 0.2 MG tablet TAKE ONE TABLET BY MOUTH TWICE DAILY 07/21/19   Ladell Pier, MD  Collagen-Boron-Hyaluronic Acid ([employer] JOINT [employer]ALTH TRIPLE ACTION PO) Take 1 tablet by mouth daily.    [provider]  darifenacin (ENABLEX) 7.5 MG 24 hr tablet Take 1 tablet (7.5 mg total) by mouth daily. 07/20/19   Elsie Stain, MD  DULoxetine (CYMBALTA) 30 MG capsule Take 1 capsule (30 mg total) by mouth daily. 04/15/19   Ladell Pier, MD  furosemide (LASIX) 40 MG tablet Take 1 tablet (40 mg total) by mouth daily. 1  tab po daily 06/29/19   Hosie Poisson, MD  guaiFENesin-dextromethorphan (ROBITUSSIN DM) 100-10 MG/5ML syrup Take 5 mLs by mouth every 4 (four) hours as needed for cough. 06/29/19   Hosie Poisson, MD  hydrOXYzine (ATARAX/VISTARIL) 50 MG tablet TAKE ONE TABLET BY MOUTH THREE TIMES DAILY AS NEEDED FOR ANXIETY OR FOR  ITCHING Patient taking differently: Take 50 mg by mouth every 8 (eight) hours as needed for anxiety.  06/07/19   Ladell Pier, MD  ipratropium-albuterol (DUONEB) 0.5-2.5 (3) MG/3ML SOLN Take 3 mLs by nebulization every 6 (six) hours as needed (SOB). 06/30/19 07/30/19  Hosie Poisson, MD  isosorbide mononitrate (IMDUR) 30 MG 24 hr tablet Take 1 tablet (30 mg total) by mouth daily. 04/15/19   Ladell Pier, MD  lisinopril (ZESTRIL) 40 MG tablet Take 40 mg by mouth daily.    [provider]  Misc. Devices MISC Please provide patient with a raised toilet seat that is covered under her current insurance plan. 03/29/18   Gildardo Pounds, NP  Misc. Devices MISC Heavy duty shower chair; Heavy duty potty chair. Dx: Osteoarthritis 10/13/18   Charlott Rakes, MD  nystatin (MYCOSTATIN/NYSTOP) powder Apply topically 4 (four) times daily. Apply beneath breast and below abdominal fold 07/20/19   Elsie Stain, MD  Omega-3 Fatty Acids (FISH OIL) 1000 MG CAPS Take 1 capsule (1,000 mg total) by mouth daily. 06/30/19   Hosie Poisson, MD  pantoprazole (PROTONIX) 40 MG tablet Take 1 tablet (40 mg total) by mouth 2 (two) times daily before a meal. 06/29/19   Hosie Poisson, MD  permethrin (ELIMITE) 5 % cream Apply 1 application topically daily. 07/20/19   Elsie Stain, MD  polyethylene glycol (MIRALAX / GLYCOLAX) 17 g packet Take 17 g by mouth daily as needed. 06/29/19   Hosie Poisson, MD  senna-docusate (SENOKOT-S) 8.6-50 MG tablet Take 1 tablet by mouth at bedtime as needed for mild constipation. 06/29/19   Hosie Poisson, MD  triamcinolone cream (KENALOG) 0.1 % Apply 458 application topically 2 (two) times  daily. To rash on back 07/20/19   Elsie Stain, MD  vitamin B-12 (CYANOCOBALAMIN) 500 MCG tablet Take 500 mcg by mouth daily.    [provider]  Vitamin E 100 units TABS Take 1 tablet by mouth daily. 06/30/19   Hosie Poisson, MD  diphenhydrAMINE (SOMINEX) 25 MG tablet Take 25 mg by mouth at bedtime as needed.  06/14/11  [provider]    Allergies    Ibuprofen, Penicillins, Sulfa antibiotics, Adhesive [tape], Doxycycline, Sulfamethoxazole-trimethoprim, and Zithromax [azithromycin]  Review of Systems   Review of Systems  Constitutional: Negative for fever.  Respiratory: Positive for cough, sputum production and shortness of breath. Negative for hemoptysis.   Cardiovascular: Positive for chest pain and leg swelling.  Gastrointestinal: Negative for vomiting.  Skin: Positive for rash.  All other systems reviewed and are negative.   Physical Exam Updated Vital Signs BP 123/85 (BP Location: Left Wrist)   Pulse 73   Temp 99.1 F (37.3 C) (Oral)   Resp 18   LMP 03/24/2009   SpO2 94%   Physical Exam CONSTITUTIONAL: Chronically ill-appearing, mildly tachypneic HEAD: Normocephalic/atraumatic EYES: EOMI/PERRL ENMT: Mucous membranes moist NECK: supple no meningeal signs SPINE/BACK:entire spine nontender CV: S1/S2 noted, distant heart sounds LUNGS: Mild tachypnea noted.  Decreased present bilaterally ABDOMEN: soft, nontender, no rebound or guarding, bowel sounds noted throughout abdomen, obese GU:no cva tenderness NEURO: Pt is awake/alert/appropriate, moves all extremitiesx4.  No facial droop.  EXTREMITIES: pulses normal/equal, full ROM, edema noted bilateral lower extremities.  No evidence of cellulitis to legs.  Distal pulses intact.  Legs are symmetric.  No overlying erythema to her lower extremities SKIN: warm, color normal she has scattered rash throughout her torso and legs.  Multiple areas of excoriation noted.  No signs of abscess or  cellulitis.  No  crepitus PSYCH: no abnormalities of mood noted, alert and oriented to situation  ED Results / Procedures / Treatments   Labs (all labs ordered are listed, but only abnormal results are displayed) Labs Reviewed  BASIC METABOLIC PANEL - Abnormal; Notable for the following components:      Result Value   Potassium 6.2 (*)    BUN 29 (*)    Creatinine, Ser 1.58 (*)    Calcium 8.7 (*)    GFR calc non Af Amer 36 (*)    GFR calc Af Amer 42 (*)    All other components within normal limits  CBC - Abnormal; Notable for the following components:   WBC 11.9 (*)    Hemoglobin 11.2 (*)    MCHC 28.0 (*)    RDW 15.8 (*)    All other components within normal limits  SARS CORONAVIRUS 2 (TAT 6-24 HRS)  BRAIN NATRIURETIC PEPTIDE  I-STAT BETA HCG BLOOD, ED ([facility], [facility], [facility] ONLY)  TROPONIN I (HIGH SENSITIVITY)  TROPONIN I (HIGH SENSITIVITY)    EKG EKG Interpretation  Date/Time:  Tuesday Aug 09 2019 21:44:53 EDT Ventricular Rate:  75 PR Interval:  146 QRS Duration: 84 QT Interval:  390 QTC Calculation: 435 R Axis:   65 Text Interpretation: Normal sinus rhythm Low voltage QRS Borderline ECG Interpretation limited secondary to artifact Confirmed by Ripley Fraise [phone]) on 08/10/2019 5:09:52 AM   Radiology DG Chest 2 View  Result Date: 08/09/2019 CLINICAL DATA:  Chest pain EXAM: CHEST - 2 VIEW COMPARISON:  06/25/2019 FINDINGS: Cardiomegaly with vascular congestion. Interstitial prominence may reflect early interstitial edema. No effusions. No acute bony abnormality. IMPRESSION: Cardiomegaly with vascular congestion and possible early interstitial edema. Electronically Signed   By: Rolm Baptise M.D.   On: 08/09/2019 22:28    Procedures Procedures  Medications Ordered in ED Medications  sodium chloride flush (NS) 0.9 % injection 3 mL (has no administration in time range)  furosemide (LASIX) injection 80 mg (80 mg Intravenous Given 08/10/19 0611)  oxyCODONE-acetaminophen (PERCOCET/ROXICET)  5-325 MG per tablet 2 tablet (2 tablets Oral Given 08/10/19 9417)    ED Course  I have reviewed the triage vital signs and the nursing notes.  Pertinent labs & imaging results that were available during my care of the patient were reviewed by me and considered in my medical decision making (see chart for details).    MDM Rules/Calculators/A&P                      5:54 AM Patient presents with increasing shortness of breath.  She has a history of chronic respiratory failure.  X-ray reveals cardiomegaly and pulmonary edema She will need to be admitted for diuresis.  Patient also noted to have mild hyperkalemia without EKG changes.  She will be given IV Lasix 6:30 AM Patient stable.  She has been given Lasix which will help her edema as well as hyperkalemia Discussed with Dr. Hal Hope for admission. Patient will need admission for diuresis and monitoring.  Patient would like to be able to improve enough to be ambulatory again. Patient reports chest pain was worse with cough and deep breathing, likely strain , lower suspicion for ACS/PE at this time. Final Clinical Impression(s) / ED Diagnoses Final diagnoses:  Acute pulmonary edema ([place])  Hyperkalemia    Rx / DC Orders ED Discharge Orders    None       Ripley Fraise, MD 08/10/19 [phone]

## 2019-08-10 NOTE — ED Notes (Signed)
Pt transported to nuclear med.  

## 2019-08-10 NOTE — H&P (Signed)
History and Physical    Kara Vincent TKW:[id number] DOB: 12/01/1965 DOA: 08/09/2019  Referring MD/NP/PA: Gean Birchwood, MD PCP: Ladell Pier, MD  Patient coming from:   Chief Complaint: Shortness of breath and chest pain  I have personally briefly reviewed patient's old medical records in [place]   HPI: Kara Vincent is a 54 y.o. female with medical history significant of hypertension, hyperlipidemia, CHF, chronic respiratory failure, oxygen dependent on 3 L, asthma, cellulitis, chronic kidney disease, and chronic pain presented with complaints of shortness of breath and chest pain.  Symptoms have been intermittent over several days.  She describes the pain as pleuritic in nature.  Notes associated symptoms of productive cough with yellow sputum, wheezing, rash, leg swelling, and right leg pain.  She was concern for the possibility of having cellulitis of her right leg.  At baseline patient gets around with use of a motorized scooter. Patient goes into more detail stating that she has had bedbugs and they have caused several wounds all over that appear to be red and infected.   ED Course: Upon admission into the emergency department patient was seen to be afebrile, respirations 18-23, O2 saturations as low as 86% on home 3 L of oxygen, and all other vital signs maintained. Labs significant for WBC 11.9, hemoglobin 11.2, potassium 6.2, BUN 29, creatinine 1.58, BNP 60.1, and high-sensitivity troponin negative x2. Chest x-ray concerning for cardiomegaly, vascular congestion, and possible early edema. Patient was given 80 mg of furosemide, 2 puffs of albuterol, 60 mg of prednisone, Mucinex, and oxycodone for pain. [facility] called to admit.  Review of Systems  Constitutional: Positive for malaise/fatigue. Negative for fever.  HENT: Negative for ear discharge and nosebleeds.   Eyes: Negative for photophobia and pain.  Respiratory: Positive for shortness of breath and wheezing.     Cardiovascular: Positive for chest pain and leg swelling.  Gastrointestinal: Negative for abdominal pain, nausea and vomiting.  Genitourinary: Negative for dysuria and hematuria.  Musculoskeletal: Positive for myalgias. Negative for falls.  Skin: Positive for rash.  Neurological: Negative for focal weakness and loss of consciousness.  Endo/Heme/Allergies: Does not bruise/bleed easily.  Psychiatric/Behavioral: Negative for memory loss and substance abuse.    Past Medical History:  Diagnosis Date  . Allergy   . Anxiety   . Arthritis   . Asthma   . Asthma   . CHF (congestive heart failure) ([place])   . CHF (congestive heart failure) ([place])   . Chronic abdominal pain   . Chronic kidney disease   . Chronic pain    "all over"  . 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 GERD (gastroesophageal reflux disease)   . Gout   . Hidradenitis   . HLD (hyperlipidemia) 12/14/2015  . Homelessness   . Hyperlipemia   . Hypertension   . Low back pain   . Morbid obesity ([place])    uses motor wheel chair  . MRSA (methicillin resistant Staphylococcus aureus)    states about a year ago  . On home O2    3L N/C O2 continuously  . OSA (obstructive sleep apnea)    non-compliant with CPAP  . Oxygen deficiency   . Renal insufficiency   . Sleep apnea     Past Surgical History:  Procedure Laterality Date  . BACK SURGERY    . CESAREAN SECTION    . CESAREAN SECTION    . FRACTURE SURGERY    . TUBAL LIGATION  reports that she quit smoking about 16 years ago. Her smoking use included cigarettes. She quit smokeless tobacco use about 41 years ago. She reports current alcohol use. She reports current drug use. Drugs: "Crack" cocaine and Other-see comments.  Allergies  Allergen Reactions  . Ibuprofen Hives, Shortness Of Breath, Palpitations and Rash    Has tolerated toradol 12/2013 inpatient  . Penicillins Anaphylaxis, Hives and Rash    Has patient had a PCN reaction causing immediate  rash, facial/tongue/throat swelling, SOB or lightheadedness with hypotension: Yes Has patient had a PCN reaction causing severe rash involving mucus membranes or skin necrosis: Yes Has patient had a PCN reaction that required hospitalization Unknown Has patient had a PCN reaction occurring within the last 10 years: Unknown If all of the above answers are "NO", then may proceed with Cephalosporin use.   . Sulfa Antibiotics Hives, Shortness Of Breath, Rash and Cough  . Adhesive [Tape] Rash  . Doxycycline Hives, Swelling and Rash  . Sulfamethoxazole-Trimethoprim Rash  . Zithromax [Azithromycin] Hives, Swelling and Rash    Family History  Problem Relation Age of Onset  . Asthma Mother   . Diabetes Father   . Stroke Father   . Heart disease Father   . Arthritis Sister   . Asthma Sister   . Asthma Daughter   . Asthma Son   . Arthritis Sister   . Asthma Sister     Prior to Admission medications   Medication Sig Start Date End Date Taking? Authorizing Provider  acetaminophen-codeine (TYLENOL #3) 300-30 MG tablet Take 1-2 tablets by mouth every 4 (four) hours as needed for moderate pain. 07/20/19  Yes Elsie Stain, MD  albuterol (VENTOLIN HFA) 108 (90 Base) MCG/ACT inhaler Inhale 1-2 puffs into the lungs every 6 (six) hours as needed for wheezing or shortness of breath. 11/22/18 06/13/28 Yes Ladell Pier, MD  allopurinol (ZYLOPRIM) 100 MG tablet Take 1 tablet (100 mg total) by mouth every morning. Patient taking differently: Take 100 mg by mouth daily.  11/22/18  Yes Ladell Pier, MD  arformoterol (BROVANA) 15 MCG/2ML NEBU Take 2 mLs (15 mcg total) by nebulization 2 (two) times daily. 06/29/19  Yes Hosie Poisson, MD  benzonatate (TESSALON) 100 MG capsule Take 1 capsule (100 mg total) by mouth 2 (two) times daily. 07/20/19  Yes Elsie Stain, MD  budesonide (PULMICORT) 0.25 MG/2ML nebulizer solution Take 2 mLs (0.25 mg total) by nebulization 2 (two) times daily. 06/29/19  Yes  Hosie Poisson, MD  carbamide peroxide (DEBROX) 6.5 % OTIC solution Place 5 drops into both ears 2 (two) times daily. 06/30/19  Yes Hosie Poisson, MD  cholecalciferol (VITAMIN D3) 25 MCG (1000 UNIT) tablet Take 1,000 Units by mouth daily.   Yes [provider]  cloNIDine (CATAPRES) 0.2 MG tablet TAKE ONE TABLET BY MOUTH TWICE DAILY Patient taking differently: Take 0.2 mg by mouth 2 (two) times daily.  07/21/19  Yes Ladell Pier, MD  Collagen-Boron-Hyaluronic Acid ([employer] JO[employer] HEALTH TRIPLE ACTION PO) Take 1 tablet by mouth daily.   Yes [provider]  darifenacin (ENABLEX) 7.5 MG 24 hr tablet Take 1 tablet (7.5 mg total) by mouth daily. 07/20/19  Yes Elsie Stain, MD  DULoxetine (CYMBALTA) 30 MG capsule Take 1 capsule (30 mg total) by mouth daily. 04/15/19  Yes Ladell Pier, MD  furosemide (LASIX) 40 MG tablet Take 1 tablet (40 mg total) by mouth daily. 1 tab po daily 06/29/19  Yes Hosie Poisson, MD  guaiFENesin-dextromethorphan (ROBITUSSIN DM) 100-10 MG/5ML syrup Take 5 mLs by mouth every 4 (four) hours as needed for cough. 06/29/19  Yes Hosie Poisson, MD  hydrOXYzine (ATARAX/VISTARIL) 50 MG tablet TAKE ONE TABLET BY MOUTH THREE TIMES DAILY AS NEEDED FOR ANXIETY OR FOR ITCHING Patient taking differently: Take 50 mg by mouth every 8 (eight) hours as needed for anxiety.  06/07/19  Yes Ladell Pier, MD  ipratropium-albuterol (DUONEB) 0.5-2.5 (3) MG/3ML SOLN Take 3 mLs by nebulization every 6 (six) hours as needed (SOB). 06/30/19 08/09/28 Yes Hosie Poisson, MD  isosorbide mononitrate (IMDUR) 30 MG 24 hr tablet Take 1 tablet (30 mg total) by mouth daily. 04/15/19  Yes Ladell Pier, MD  lisinopril (ZESTRIL) 40 MG tablet Take 40 mg by mouth daily.   Yes [provider]  nystatin (MYCOSTATIN/NYSTOP) powder Apply topically 4 (four) times daily. Apply beneath breast and below abdominal fold 07/20/19  Yes Elsie Stain, MD  Omega-3 Fatty Acids (FISH OIL) 1000 MG CAPS  Take 1 capsule (1,000 mg total) by mouth daily. 06/30/19  Yes Hosie Poisson, MD  pantoprazole (PROTONIX) 40 MG tablet Take 1 tablet (40 mg total) by mouth 2 (two) times daily before a meal. 06/29/19  Yes Hosie Poisson, MD  permethrin (ELIMITE) 5 % cream Apply 1 application topically daily. 07/20/19  Yes Elsie Stain, MD  polyethylene glycol (MIRALAX / GLYCOLAX) 17 g packet Take 17 g by mouth daily as needed. Patient taking differently: Take 17 g by mouth daily as needed for mild constipation.  06/29/19  Yes Hosie Poisson, MD  senna-docusate (SENOKOT-S) 8.6-50 MG tablet Take 1 tablet by mouth at bedtime as needed for mild constipation. 06/29/19  Yes Hosie Poisson, MD  triamcinolone cream (KENALOG) 0.1 % Apply 951 application topically 2 (two) times daily. To rash on back 07/20/19  Yes Elsie Stain, MD  vitamin B-12 (CYANOCOBALAMIN) 500 MCG tablet Take 500 mcg by mouth daily.   Yes [provider]  Vitamin E 100 units TABS Take 1 tablet by mouth daily. 06/30/19  Yes Hosie Poisson, MD  Misc. Devices MISC Please provide patient with a raised toilet seat that is covered under her current insurance plan. 03/29/18   Gildardo Pounds, NP  Misc. Devices MISC Heavy duty shower chair; Heavy duty potty chair. Dx: Osteoarthritis 10/13/18   Charlott Rakes, MD  diphenhydrAMINE (SOMINEX) 25 MG tablet Take 25 mg by mouth at bedtime as needed.  06/14/11  [provider]    Physical Exam:  Constitutional: Morbidly obese female currently NAD, calm, comfortable Vitals:   08/09/19 2137 08/09/19 2259 08/10/19 0226 08/10/19 0600  BP: 100/63 (!) 112/53 123/85 (!) 117/91  Pulse: 75 78 73 89  Resp: 18 18 18  (!) 23  Temp: 99.1 F (37.3 C)     TempSrc: Oral     SpO2: 95% 92% 94% (!) 88%   Eyes: PERRL, lids and conjunctivae normal ENMT: Mucous membranes are moist. Posterior pharynx clear of any exudate or lesions.  Neck: normal, supple, no masses, no thyromegaly Respiratory: Decreased overall aeration  with positive expiratory wheeze appreciated. Patient currently on 3 L nasal cannula oxygen with O2 saturations maintained. Cardiovascular: Regular rate and rhythm, no murmurs / rubs / gallops. No extremity edema. 2+ pedal pulses. No carotid bruits.  Abdomen: no tenderness, no masses palpated. No hepatosplenomegaly. Bowel sounds positive.  Musculoskeletal: no clubbing / cyanosis. No joint deformity upper and lower extremities. Good ROM, no contractures. Normal muscle tone.  Skin: Multiple sores all  over her upper and lower extremities with surrounding erythema appreciated. Yeast infection noted under certain folds of skin. Neurologic: CN 2-12 grossly intact. Sensation intact, DTR normal. Strength 5/5 in all 4.  Psychiatric: Normal judgment and insight. Alert and oriented x 3. Normal mood.     Labs on Admission: I have personally reviewed following labs and imaging studies  CBC: Recent Labs  Lab 08/09/19 2201  WBC 11.9*  HGB 11.2*  HCT 40.0  MCV 98.5  PLT 580   Basic Metabolic Panel: Recent Labs  Lab 08/09/19 2201  NA 143  K 6.2*  CL 103  CO2 31  GLUCOSE 97  BUN 29*  CREATININE 1.58*  CALCIUM 8.7*   GFR: CrCl cannot be calculated (Unknown ideal weight.). Liver Function Tests: No results for input(s): AST, ALT, ALKPHOS, BILITOT, PROT, ALBUMIN in the last 168 hours. No results for input(s): LIPASE, AMYLASE in the last 168 hours. No results for input(s): AMMONIA in the last 168 hours. Coagulation Profile: No results for input(s): INR, PROTIME in the last 168 hours. Cardiac Enzymes: No results for input(s): CKTOTAL, CKMB, CKMBINDEX, TROPONINI in the last 168 hours. BNP (last 3 results) No results for input(s): PROBNP in the last 8760 hours. HbA1C: No results for input(s): HGBA1C in the last 72 hours. CBG: No results for input(s): GLUCAP in the last 168 hours. Lipid Profile: No results for input(s): CHOL, HDL, LDLCALC, TRIG, CHOLHDL, LDLDIRECT in the last 72  hours. Thyroid Function Tests: No results for input(s): TSH, T4TOTAL, FREET4, T3FREE, THYROIDAB in the last 72 hours. Anemia Panel: No results for input(s): VITAMINB12, FOLATE, FERRITIN, TIBC, IRON, RETICCTPCT in the last 72 hours. Urine analysis:    Component Value Date/Time   COLORURINE YELLOW 04/06/2018 2101   APPEARANCEUR CLEAR 04/06/2018 2101   LABSPEC 1.023 04/06/2018 2101   PHURINE 5.0 04/06/2018 2101   GLUCOSEU NEGATIVE 04/06/2018 2101   GLUCOSEU NEG mg/dL 10/01/2007 2007   HGBUR NEGATIVE 04/06/2018 2101   HGBUR negative 12/05/2009 1003   BILIRUBINUR NEGATIVE 04/06/2018 2101   BILIRUBINUR moderate (A) 03/29/2018 1701   BILIRUBINUR small 07/31/2010 [place] 04/06/2018 2101   PROTEINUR NEGATIVE 04/06/2018 2101   UROBILINOGEN 1.0 03/29/2018 1701   UROBILINOGEN 0.2 12/01/2014 2240   NITRITE POSITIVE (A) 04/06/2018 2101   LEUKOCYTESUR TRACE (A) 04/06/2018 2101   Sepsis Labs: No results found for this or any previous visit (from the past 240 hour(s)).   Radiological Exams on Admission: DG Chest 2 View  Result Date: 08/09/2019 CLINICAL DATA:  Chest pain EXAM: CHEST - 2 VIEW COMPARISON:  06/25/2019 FINDINGS: Cardiomegaly with vascular congestion. Interstitial prominence may reflect early interstitial edema. No effusions. No acute bony abnormality. IMPRESSION: Cardiomegaly with vascular congestion and possible early interstitial edema. Electronically Signed   By: Rolm Baptise M.D.   On: 08/09/2019 22:28    EKG: Independently reviewed. Sinus rhythm at 75 bpm with low voltage  Assessment/Plan Respiratory failure with hypoxia secondary to diastolic congestive heart failure: Acute on chronic. Patient presents with complaints of chest pain. Chest x-ray showing cardiomegaly with signs of edema. Last echocardiogram performed on 06/20/2019 revealed EF of 60-65%. patient was given 80 mg of Lasix IV.  -Admit to a telemetry bed -Heart failure orders set  initiated   -Continuous pulse oximetry with nasal cannula oxygen as needed to keep O2 saturations >92% -Strict I&Os and daily weights -Elevate lower extremities -Lasix 40 mg IV Bid -Reassess in a.m. and adjust diuresis as needed.  Chest pain: Patient complains  of centralized chest pain. High-sensitivity troponins negative x2 and EKG without significant ischemic changes. Low suspicion for possible pulmonary embolus.  She has a previous history of cocaine abuse.  D-dimers are chronically elevated and given patient is sedentary always concern for possible pulmonary embolus. -Check urine drug screen -Heparin drip per pharmacy -Check VQ scan  Cellulitis, candidal skin infection, bedbugs: Acute. Patient has multiple sores of her upper and lower extremities and reports exposure to bedbugs. Yeast infections noted -Permethrin cream -Nystatin powder -Rocephin IV  Hyperkalemia: Acute. Initial potassium noted to be 6.2. Patient had received Lasix. -Recheck potassium now -And treat as needed.  Leukocytosis: Acute. WBC elevated at 11.9 on admission.  Question reactive in nature versus secondary to infection. -Add on CRP -Recheck WBC in a.m.  Asthma with exacerbation: Patient noted to have some mild wheezing on physical exam. -Continue Brovana and budesonide nebs -DuoNebs 4 times daily  Normocytic anemia: Chronic.  Hemoglobin 11.2 on admission which appears near her baseline. -Continue to monitor  Essential hypertension: Blood pressures currently maintained.  Home blood pressure medications include clonidine 0.2 mg twice daily, furosemide 40 mg daily, isosorbide mononitrate 30 mg daily, and lisinopril 40 mg daily. -Continue clonidine, isosorbide mononitrate, and lisinopril as tolerated  Chronic kidney disease stage IIIb: Creatinine 1.58 on admission which appears near patient's baseline. -Continue to monitor kidney function with diuresis  Anxiety -Continue Cymbalta and hydroxyzine as  needed  GERD -Continue Protonix  OSA on CPAP -Continue CPAP at night  Morbid obesity BMI 68.15 kg/m.  DVT prophylaxis: Lovenox Code Status: Full Family Communication: Patient and friend updated at bedside. Disposition Plan: Possible discharge home in 1 to 2 days Consults called: none Admission status: Inpatient   Norval Morton MD [facility] Pager [phone]   If 7PM-7AM, please contact night-coverage [email or web address] Password [email or web address]  08/10/2019, 7:06 AM

## 2019-08-10 NOTE — Progress Notes (Signed)
Patient placed on CPAP and is resting comfortably at this time.

## 2019-08-10 NOTE — Progress Notes (Signed)
Lower venous duplex       has been completed. Preliminary results can be found under CV proc through chart review. Jerzy Roepke, BS, RDMS, RVT   

## 2019-08-10 NOTE — ED Notes (Signed)
Pt given sandwich and meal tray.

## 2019-08-10 NOTE — ED Notes (Signed)
Lunch Tray Ordered @ 1040. 

## 2019-08-10 NOTE — Progress Notes (Signed)
ANTICOAGULATION CONSULT NOTE - Initial Consult  Pharmacy Consult for heparin Indication: VTE r/o  Allergies  Allergen Reactions  . Ibuprofen Hives, Shortness Of Breath, Palpitations and Rash    Has tolerated toradol 12/2013 inpatient  . Penicillins Anaphylaxis, Hives and Rash    Has patient had a PCN reaction causing immediate rash, facial/tongue/throat swelling, SOB or lightheadedness with hypotension: Yes Has patient had a PCN reaction causing severe rash involving mucus membranes or skin necrosis: Yes Has patient had a PCN reaction that required hospitalization Unknown Has patient had a PCN reaction occurring within the last 10 years: Unknown If all of the above answers are "NO", then may proceed with Cephalosporin use.   . Sulfa Antibiotics Hives, Shortness Of Breath, Rash and Cough  . Adhesive [Tape] Rash  . Doxycycline Hives, Swelling and Rash  . Sulfamethoxazole-Trimethoprim Rash  . Zithromax [Azithromycin] Hives, Swelling and Rash    Patient Measurements:   Heparin Dosing Weight: 110kg  Vital Signs: BP: 121/59 (05/19 0815) Pulse Rate: 90 (05/19 0815)  Labs: Recent Labs    08/09/19 2201 08/10/19 0004  HGB 11.2*  --   HCT 40.0  --   PLT 317  --   CREATININE 1.58*  --   TROPONINIHS 4 3    CrCl cannot be calculated (Unknown ideal weight.).   Medical History: Past Medical History:  Diagnosis Date  . Allergy   . Anxiety   . Arthritis   . Asthma   . Asthma   . CHF (congestive heart failure) ([place])   . CHF (congestive heart failure) ([place])   . Chronic abdominal pain   . Chronic kidney disease   . Chronic pain    "all over"  . 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 GERD (gastroesophageal reflux disease)   . Gout   . Hidradenitis   . HLD (hyperlipidemia) 12/14/2015  . Homelessness   . Hyperlipemia   . Hypertension   . Low back pain   . Morbid obesity ([place])    uses motor wheel chair  . MRSA (methicillin resistant Staphylococcus aureus)    states about a year ago  . On home O2    3L N/C O2 continuously  . OSA (obstructive sleep apnea)    non-compliant with CPAP  . Oxygen deficiency   . Renal insufficiency   . Sleep apnea    Assessment: 31 YOF presenting with SOB, no PE seen on CT however severely limited d/t body habitus.  For VQ study.  Not on anticoagulation PTA.  Chronic anemia stable, plts wnl.    Goal of Therapy:  Heparin level 0.3-0.7 units/ml Monitor platelets by anticoagulation protocol: Yes   Plan:  Heparin 5500 units IV x 1, and gtt at 1800 units/hr F/u 6 hour heparin level F/u VQ and doppler for VTE workup  Bertis Ruddy, PharmD Clinical Pharmacist ED Pharmacist Phone # [phone] 08/10/2019 10:10 AM

## 2019-08-11 DIAGNOSIS — J810 Acute pulmonary edema: Secondary | ICD-10-CM

## 2019-08-11 DIAGNOSIS — J9622 Acute and chronic respiratory failure with hypercapnia: Secondary | ICD-10-CM

## 2019-08-11 DIAGNOSIS — J9621 Acute and chronic respiratory failure with hypoxia: Secondary | ICD-10-CM

## 2019-08-11 DIAGNOSIS — E875 Hyperkalemia: Secondary | ICD-10-CM

## 2019-08-11 LAB — BASIC METABOLIC PANEL
Anion gap: 8 (ref 5–15)
BUN: 33 mg/dL — ABNORMAL HIGH (ref 6–20)
CO2: 32 mmol/L (ref 22–32)
Calcium: 8.2 mg/dL — ABNORMAL LOW (ref 8.9–10.3)
Chloride: 101 mmol/L (ref 98–111)
Creatinine, Ser: 1.8 mg/dL — ABNORMAL HIGH (ref 0.44–1.00)
GFR calc Af Amer: 36 mL/min — ABNORMAL LOW (ref >60)
GFR calc non Af Amer: 31 mL/min — ABNORMAL LOW (ref >60)
Glucose, Bld: 106 mg/dL — ABNORMAL HIGH (ref 70–99)
Potassium: 5.6 mmol/L — ABNORMAL HIGH (ref 3.5–5.1)
Sodium: 141 mmol/L (ref 135–145)

## 2019-08-11 LAB — CBC WITH DIFFERENTIAL/PLATELET
Abs Immature Granulocytes: 0.05 10*3/uL (ref 0.00–0.07)
Basophils Absolute: 0 10*3/uL (ref 0.0–0.1)
Basophils Relative: 0 %
Eosinophils Absolute: 0 10*3/uL (ref 0.0–0.5)
Eosinophils Relative: 0 %
HCT: 36.9 % (ref 36.0–46.0)
Hemoglobin: 10.3 g/dL — ABNORMAL LOW (ref 12.0–15.0)
Immature Granulocytes: 1 %
Lymphocytes Relative: 15 %
Lymphs Abs: 1.5 10*3/uL (ref 0.7–4.0)
MCH: 27.5 pg (ref 26.0–34.0)
MCHC: 27.9 g/dL — ABNORMAL LOW (ref 30.0–36.0)
MCV: 98.7 fL (ref 80.0–100.0)
Monocytes Absolute: 0.4 10*3/uL (ref 0.1–1.0)
Monocytes Relative: 5 %
Neutro Abs: 7.5 10*3/uL (ref 1.7–7.7)
Neutrophils Relative %: 79 %
Platelets: 314 10*3/uL (ref 150–400)
RBC: 3.74 MIL/uL — ABNORMAL LOW (ref 3.87–5.11)
RDW: 15.7 % — ABNORMAL HIGH (ref 11.5–15.5)
WBC: 9.4 10*3/uL (ref 4.0–10.5)
nRBC: 0 % (ref 0.0–0.2)

## 2019-08-11 LAB — MRSA PCR SCREENING: MRSA by PCR: POSITIVE — AB

## 2019-08-11 LAB — MAGNESIUM: Magnesium: 1.9 mg/dL (ref 1.7–2.4)

## 2019-08-11 IMAGING — CT CT HEAD W/O CM
3 series · 15 of 47 positions shown, 18 images · non-contrast
Comparison: CT brain scan of 11/26/2015

CLINICAL DATA: Chronic headache, intermittent left-sided numbness,
morbid obesity

EXAM:
CT HEAD WITHOUT CONTRAST
TECHNIQUE: Contiguous axial images were obtained from the base of the skull
through the vertex without intravenous contrast.

[Series 2: head wo · axial · 0.49mm/px · z∈[-194,-69]mm · 9 of 31 slices shown, 12 images]
[im 3/31  brain]
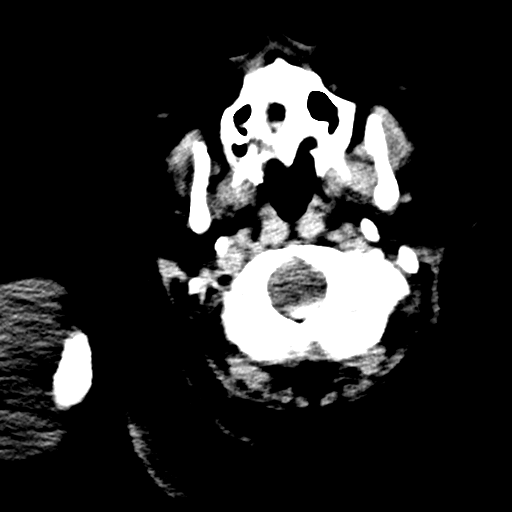
[im 3/31  bone]
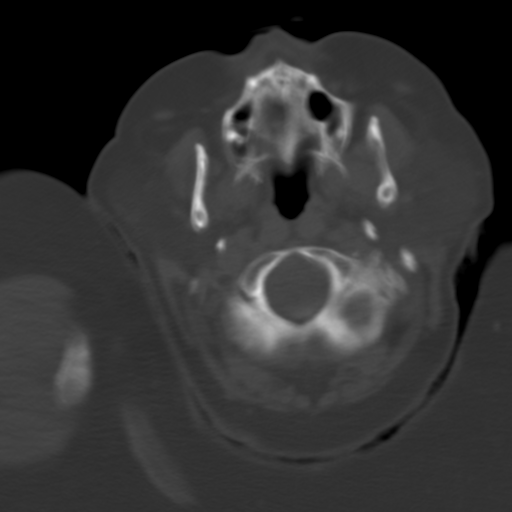
[im 6/31  brain]
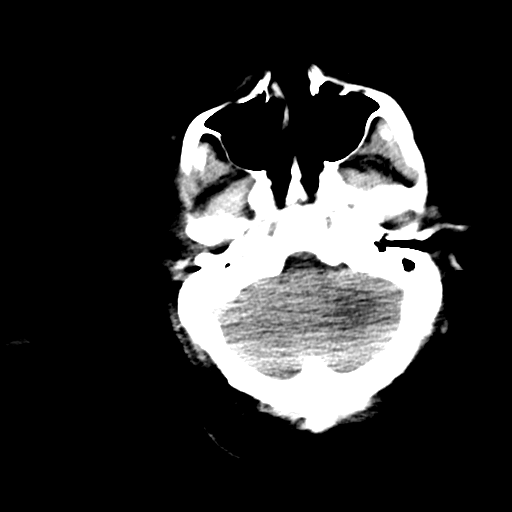
[im 9/31  brain]
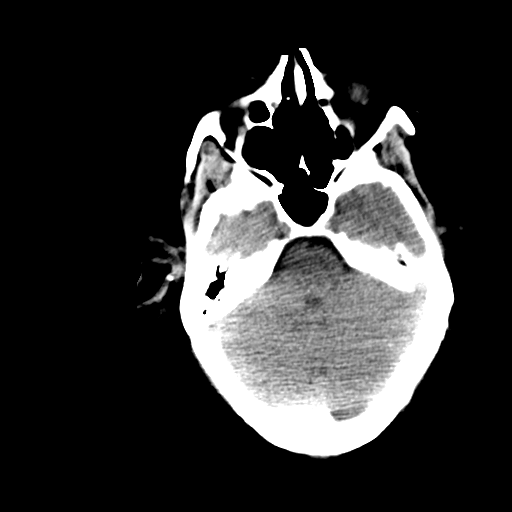
[im 12/31  brain]
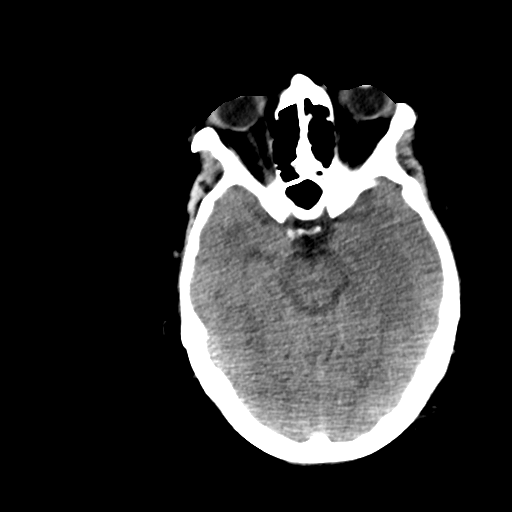
[im 16/31  brain]
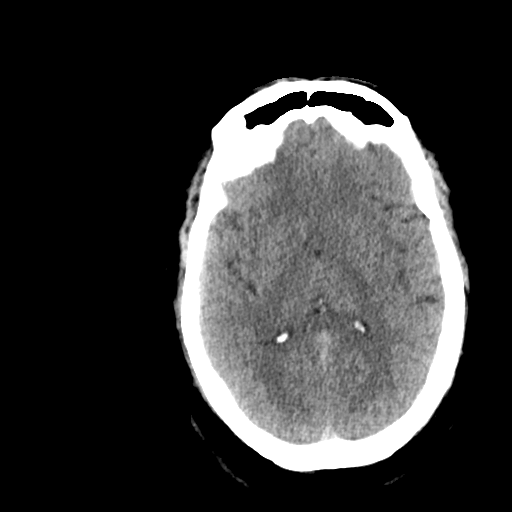
[im 16/31  bone]
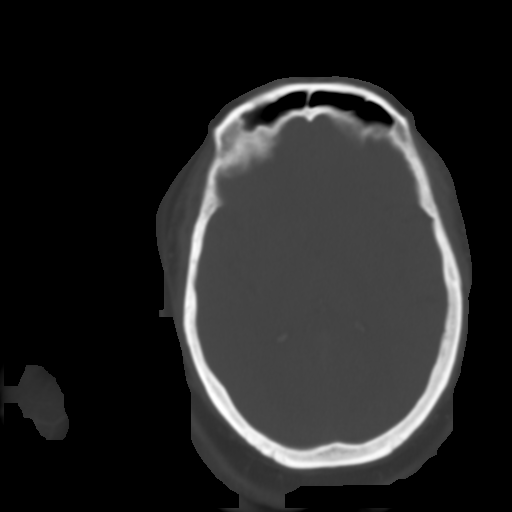
[im 19/31  brain]
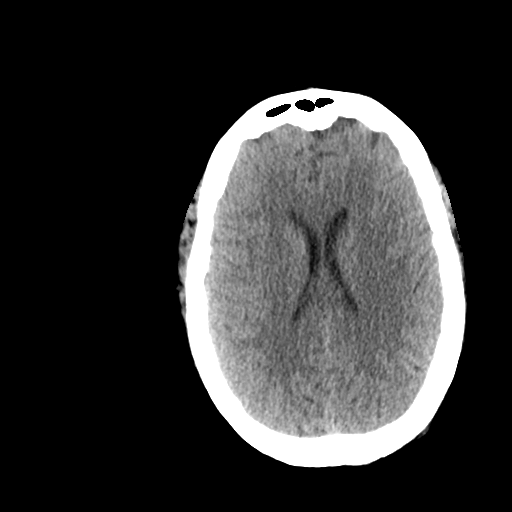
[im 22/31  brain]
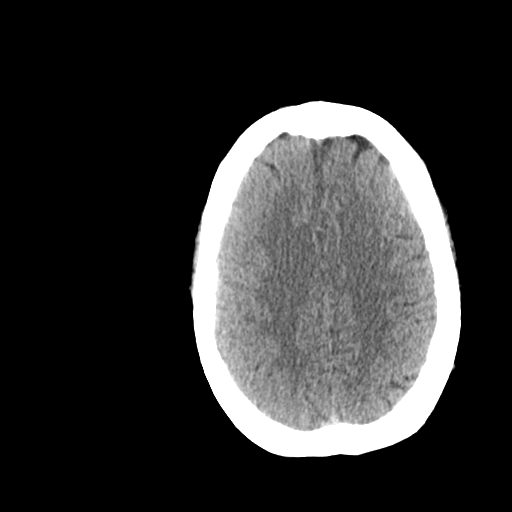
[im 25/31  brain]
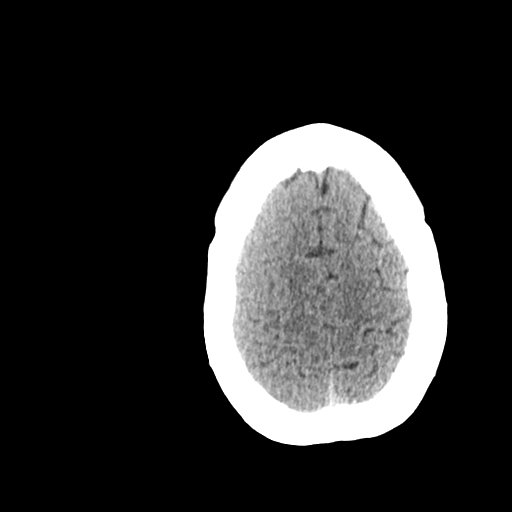
[im 28/31  brain]
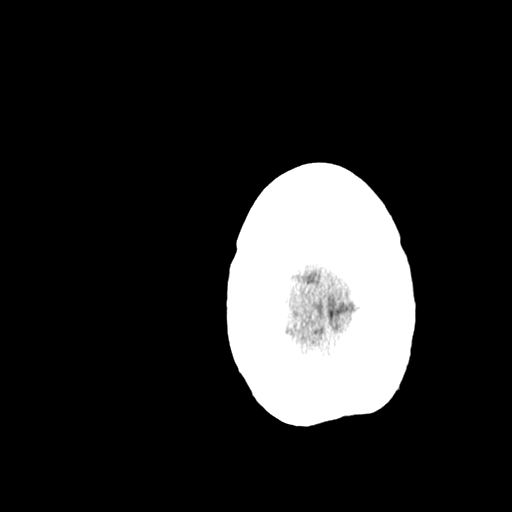
[im 28/31  bone]
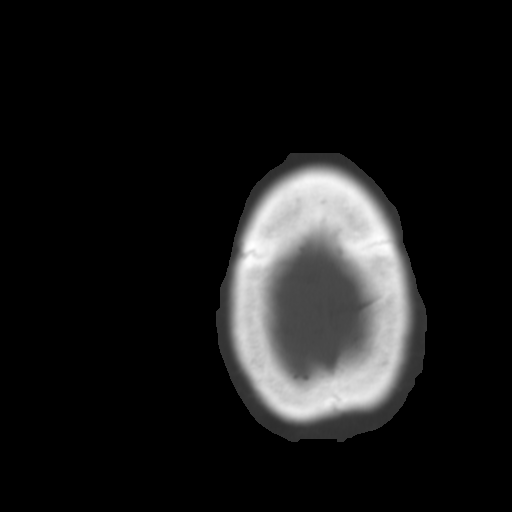

[Series 4: coronal soft tissue · coronal · 0.33mm/px · 3 of 79 slices shown]
[im 27/79  brain]
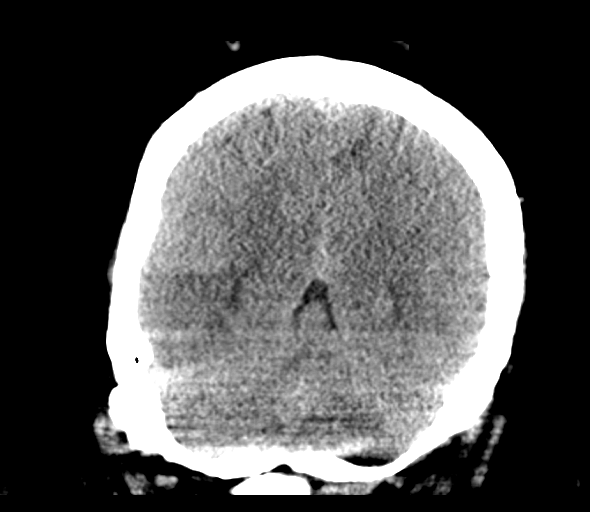
[im 35/79  brain]
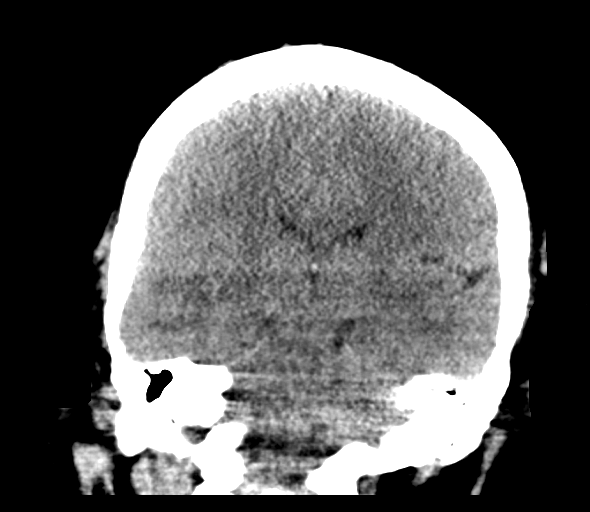
[im 44/79  brain]
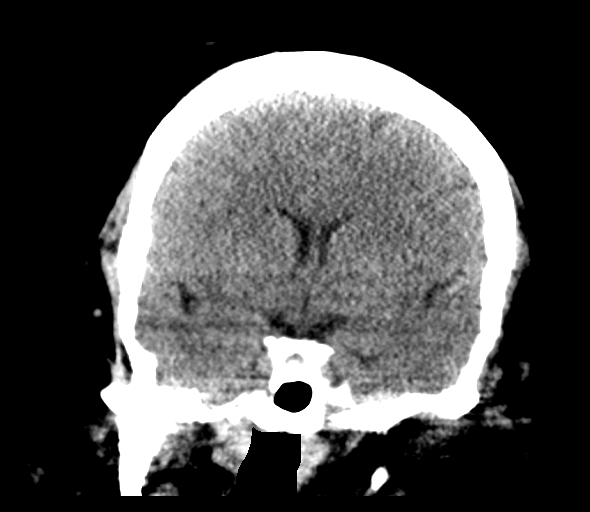

[Series 5: sagittal soft tissue · sagittal · 0.31mm/px · 3 of 67 slices shown]
[im 23/67  brain]
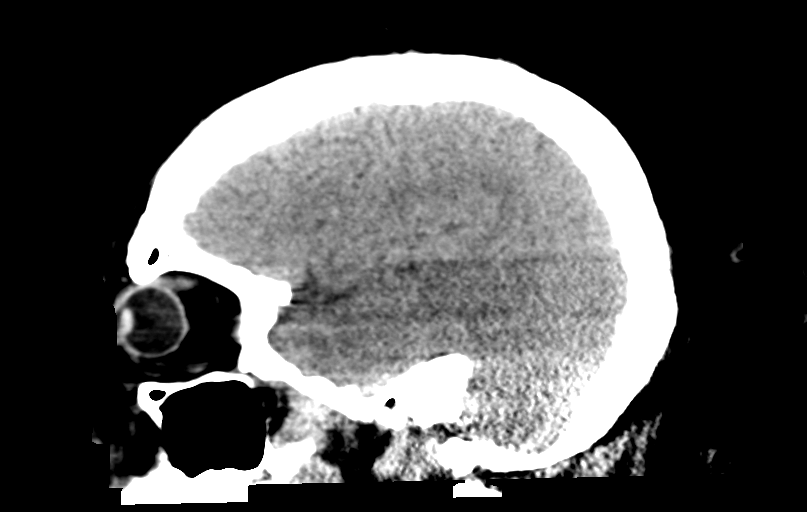
[im 34/67  brain]
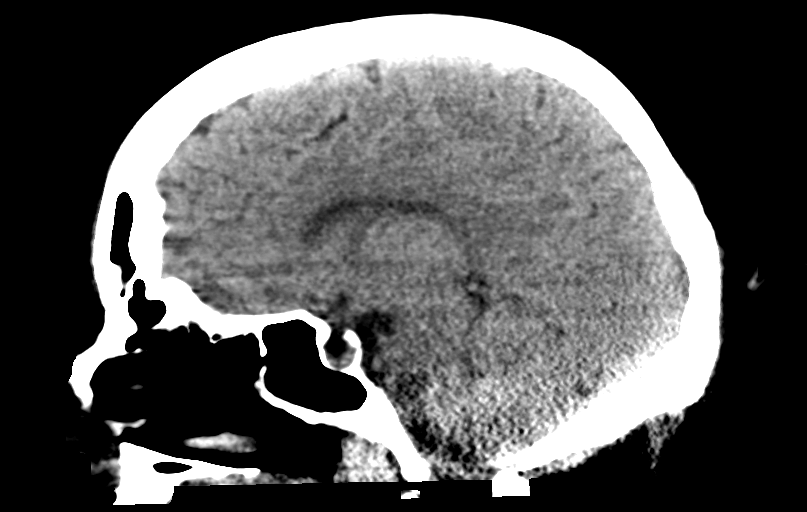
[im 45/67  brain]
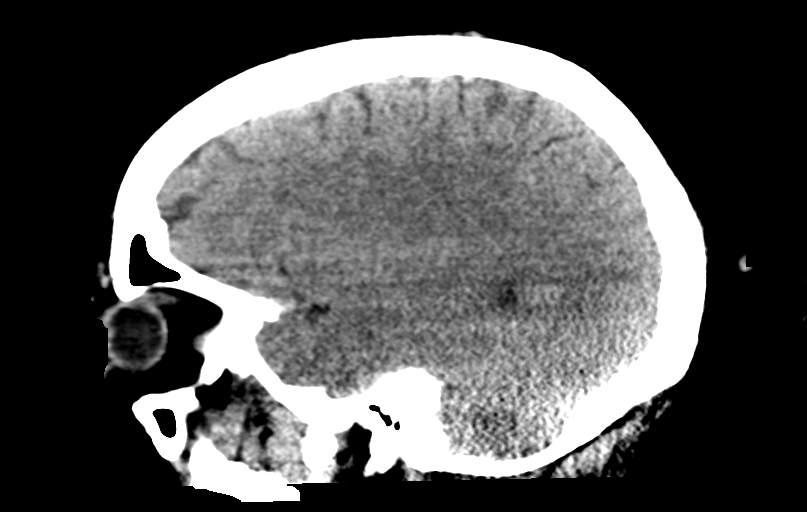

[15 of 47 positions shown; findings below may reference images not displayed]

FINDINGS: Brain: The ventricular system is stable in size and configuration
and the septum is in a midline position. The fourth ventricle and
basilar cisterns are unremarkable. No hemorrhage, mass lesion, or
acute infarction is seen.

Vascular: No vascular abnormality is noted on this unenhanced study.

Skull: On bone window images, no calvarial abnormality is noted.

Sinuses/Orbits: The paranasal sinuses are well pneumatized.

Other:
IMPRESSION: Negative unenhanced CT of the brain.

## 2019-08-11 MED ORDER — LISINOPRIL 10 MG PO TABS
10.0000 mg | ORAL_TABLET | Freq: Every day | ORAL | Status: DC
  Administered 2019-08-12: 10 mg via ORAL
  Filled 2019-08-11: qty 1

## 2019-08-11 MED ORDER — FLUCONAZOLE IN SODIUM CHLORIDE 200-0.9 MG/100ML-% IV SOLN
200.0000 mg | INTRAVENOUS | Status: AC
  Administered 2019-08-11 – 2019-08-15 (×5): 200 mg via INTRAVENOUS
  Filled 2019-08-11 (×5): qty 100

## 2019-08-11 MED ORDER — CLONIDINE HCL 0.1 MG PO TABS
0.1000 mg | ORAL_TABLET | Freq: Two times a day (BID) | ORAL | Status: DC
  Administered 2019-08-12: 0.1 mg via ORAL
  Filled 2019-08-11 (×2): qty 1

## 2019-08-11 NOTE — Progress Notes (Signed)
Lab called to report that MRSA is positive. Made dr Rodena Piety aware via text

## 2019-08-11 NOTE — Progress Notes (Addendum)
PROGRESS NOTE    Kara Vincent  WUJ:[id number] DOB: December 16, 1965 DOA: 08/09/2019 PCP: Marcine Matar, MD    Brief Narrative: Kara Vincent is a 54 y.o. female with medical history significant of hypertension, hyperlipidemia, CHF, chronic respiratory failure, oxygen dependent on 3 L, asthma, cellulitis, chronic kidney disease, and chronic pain presented with complaints of shortness of breath and chest pain.  Symptoms have been intermittent over several days.  She describes the pain as pleuritic in nature.  Notes associated symptoms of productive cough with yellow sputum, wheezing, rash, leg swelling, and right leg pain.  She was concern for the possibility of having cellulitis of her right leg.  At baseline patient gets around with use of a motorized scooter. Patient goes into more detail stating that she has had bedbugs and they have caused several wounds all over that appear to be red and infected.   ED Course: Upon admission into the emergency department patient was seen to be afebrile, respirations 18-23, O2 saturations as low as 86% on home 3 L of oxygen, and all other vital signs maintained. Labs significant for WBC 11.9, hemoglobin 11.2, potassium 6.2, BUN 29, creatinine 1.58, BNP 60.1, and high-sensitivity troponin negative x2. Chest x-ray concerning for cardiomegaly, vascular congestion, and possible early edema. Patient was given 80 mg of furosemide, 2 puffs of albuterol, 60 mg of prednisone, Mucinex, and oxycodone for pain. TRH called to admit.  Assessment & Plan:   Active Problems:   Acute on chronic respiratory failure with hypoxia and hypercapnia (HCC)   CKD (chronic kidney disease) stage 3, GFR 30-59 ml/min (HCC)   OSA (obstructive sleep apnea)   Leukocytosis   HLD (hyperlipidemia)   Diastolic CHF, acute on chronic (HCC)   Hyperkalemia   BMI 60.0-69.9, adult (HCC)   Anxiety   Cellulitis   Bedbug bite with infection   Candidal skin infection   #1  Acute on chronic  diastolic heart failure patient admitted with weight gain, shortness of breath with dyspnea on exertion and lower extremity edema.  She reports the symptoms have been going on for few weeks.  Echo done March 2021 shows ejection fraction 60 to 65%. Continue IV Lasix 40 mg twice daily.  She is still positive by 400 cc.  However her blood pressure is soft for me to increase her Lasix at this time. Decrease the dose of Catapres and lisinopril hoping to increase the dose of Lasix. Continue Imdur  Chest x-ray on admission shows cardiomegaly with vascular congestion and early interstitial edema.  #2  Asthma exacerbation continue p.o. prednisone and brovana  Budesonide nebs  #3  Candidal intertrigo/bedbug bites/cellulitis-continue Rocephin and permethrin cream and nystatin powder.  Add Diflucan for 3 days.  #4 morbid obesity BMI 68.15  #5 history of essential hypertension on multiple home medications including clonidine Lasix Imdur and lisinopril.  #6 CKD stage IIIb stable monitor on diuresis.  #7 history of anxiety on Cymbalta and hydroxyzine  #8 GERD on Protonix  #9 hyperkalemia potassium 5.6 magnesium 1.9 creatinine 1.80-  Estimated body mass index is 70.12 kg/m as calculated from the following:   Height as of this encounter: 5\' 4"  (1.626 m).   Weight as of this encounter: 185.3 kg.  DVT prophylaxis: Lovenox  code Status: Full code Family Communication: Discussed with patient Disposition Plan:  Status is: Inpatient  Dispo: The patient is from: Home              Anticipated d/c is to: Home  Anticipated d/c date is: Unknown              Patient currently is not medically stable to d/c.  Patient admitted with acute hypoxic respiratory failure secondary to CHF exacerbation and asthma on IV diuresis    Consultants:   None  Procedures: None Antimicrobials: Rocephin  Subjective: Patient sitting up in bed eating breakfast Requesting for a hospital bed Complaints of  fluid retention shortness of breath nausea and dyspnea on exertion.  Objective: Vitals:   08/11/19 0051 08/11/19 0313 08/11/19 0803 08/11/19 0834  BP: 98/74 (!) 117/58  (!) 111/94  Pulse: 69 63  81  Resp: 16 18  18   Temp: 98.3 F (36.8 C) 97.8 F (36.6 C)    TempSrc: Oral Oral    SpO2: 90% 96% 94% 95%  Weight:  (!) 185.3 kg    Height:        Intake/Output Summary (Last 24 hours) at 08/11/2019 1104 Last data filed at 08/11/2019 0700 Gross per 24 hour  Intake 820.92 ml  Output 150 ml  Net 670.92 ml   Filed Weights   08/10/19 1800 08/11/19 0313  Weight: (!) 180.1 kg (!) 185.3 kg    Examination:  General exam: Appears calm and comfortable  Respiratory system: Bilateral scattered wheezing and rhonchi to auscultation. Respiratory effort normal. Cardiovascular system: S1 & S2 heard, RRR. No JVD, murmurs, rubs, gallops or clicks. No pedal edema. Gastrointestinal system: Abdomen is nondistended, soft and nontender. No organomegaly or masses felt. Normal bowel sounds heard. Central nervous system: Alert and oriented. No focal neurological deficits. Extremities edema bilateral lower extremity 2+ Skin: Erythema under the abdominal folds Psychiatry: Judgement and insight appear normal. Mood & affect appropriate.     Data Reviewed: I have personally reviewed following labs and imaging studies  CBC: Recent Labs  Lab 08/09/19 2201 08/11/19 0438  WBC 11.9* 9.4  NEUTROABS  --  7.5  HGB 11.2* 10.3*  HCT 40.0 36.9  MCV 98.5 98.7  PLT 317 314   Basic Metabolic Panel: Recent Labs  Lab 08/09/19 2201 08/10/19 1004 08/11/19 0438  NA 143  --  141  K 6.2* 5.6* 5.6*  CL 103  --  101  CO2 31  --  32  GLUCOSE 97  --  106*  BUN 29*  --  33*  CREATININE 1.58*  --  1.80*  CALCIUM 8.7*  --  8.2*  MG  --   --  1.9   GFR: Estimated Creatinine Clearance: 59.6 mL/min (A) (by C-G formula based on SCr of 1.8 mg/dL (H)). Liver Function Tests: No results for input(s): AST, ALT,  ALKPHOS, BILITOT, PROT, ALBUMIN in the last 168 hours. No results for input(s): LIPASE, AMYLASE in the last 168 hours. No results for input(s): AMMONIA in the last 168 hours. Coagulation Profile: No results for input(s): INR, PROTIME in the last 168 hours. Cardiac Enzymes: No results for input(s): CKTOTAL, CKMB, CKMBINDEX, TROPONINI in the last 168 hours. BNP (last 3 results) No results for input(s): PROBNP in the last 8760 hours. HbA1C: No results for input(s): HGBA1C in the last 72 hours. CBG: No results for input(s): GLUCAP in the last 168 hours. Lipid Profile: No results for input(s): CHOL, HDL, LDLCALC, TRIG, CHOLHDL, LDLDIRECT in the last 72 hours. Thyroid Function Tests: No results for input(s): TSH, T4TOTAL, FREET4, T3FREE, THYROIDAB in the last 72 hours. Anemia Panel: No results for input(s): VITAMINB12, FOLATE, FERRITIN, TIBC, IRON, RETICCTPCT in the last 72 hours. Sepsis Labs: No  results for input(s): PROCALCITON, LATICACIDVEN in the last 168 hours.  Recent Results (from the past 240 hour(s))  SARS CORONAVIRUS 2 (TAT 6-24 HRS) Nasopharyngeal Nasopharyngeal Swab     Status: None   Collection Time: 08/10/19  6:12 AM   Specimen: Nasopharyngeal Swab  Result Value Ref Range Status   SARS Coronavirus 2 NEGATIVE NEGATIVE Final    Comment: (NOTE) SARS-CoV-2 target nucleic acids are NOT DETECTED. The SARS-CoV-2 RNA is generally detectable in upper and lower respiratory specimens during the acute phase of infection. Negative results do not preclude SARS-CoV-2 infection, do not rule out co-infections with other pathogens, and should not be used as the sole basis for treatment or other patient management decisions. Negative results must be combined with clinical observations, patient history, and epidemiological information. The expected result is Negative. Fact Sheet for Patients: HairSlick.no Fact Sheet for Healthcare  Providers: quierodirigir.com This test is not yet approved or cleared by the [place] FDA and  has been authorized for detection and/or diagnosis of SARS-CoV-2 by FDA under an Emergency Use Authorization (EUA). This EUA will remain  in effect (meaning this test can be used) for the duration of the COVID-19 declaration under Section 56 4(b)(1) of the Act, 21 U.S.C. section 360bbb-3(b)(1), unless the authorization is terminated or revoked sooner. Performed at [facility], [address] [place], [place] [address]          Radiology Studies: DG Chest 2 View  Result Date: 08/09/2019 CLINICAL DATA:  Chest pain EXAM: CHEST - 2 VIEW COMPARISON:  06/25/2019 FINDINGS: Cardiomegaly with vascular congestion. Interstitial prominence may reflect early interstitial edema. No effusions. No acute bony abnormality. IMPRESSION: Cardiomegaly with vascular congestion and possible early interstitial edema. Electronically Signed   By: Charlett Nose M.D.   On: 08/09/2019 22:28   NM Pulmonary Perfusion  Result Date: 08/10/2019 CLINICAL DATA:  Morbid obesity, positive D-dimer EXAM: NUCLEAR MEDICINE PERFUSION LUNG SCAN TECHNIQUE: Perfusion images were obtained in multiple projections after intravenous injection of radiopharmaceutical. Ventilation scans intentionally deferred if perfusion scan and chest x-ray adequate for interpretation during COVID 19 epidemic. RADIOPHARMACEUTICALS:  1.65 mCi Tc-28m MAA IV COMPARISON:  None. FINDINGS: No focal perfusion defect.  Homogeneous perfusion. IMPRESSION: No scintigraphic evidence of pulmonary embolus. Electronically Signed   By: Elige Ko   On: 08/10/2019 15:34   VAS [place] LOWER EXTREMITY VENOUS (DVT)  Result Date: 08/10/2019  Lower Venous DVTStudy Indications: Pain.  Limitations: Body habitus and Patient position/ movement/ could not lay in supine position for long due to pain. Comparison Study: 06/14/19 Performing Technologist: Jeb Levering RDMS, RVT  Examination Guidelines: A complete evaluation includes B-mode imaging, spectral Doppler, color Doppler, and power Doppler as needed of all accessible portions of each vessel. Bilateral testing is considered an integral part of a complete examination. Limited examinations for reoccurring indications may be performed as noted. The reflux portion of the exam is performed with the patient in reverse Trendelenburg.  +---------+---------------+---------+-----------+----------+--------------+ RIGHT    CompressibilityPhasicitySpontaneityPropertiesThrombus Aging +---------+---------------+---------+-----------+----------+--------------+ CFV      Full           Yes      Yes                                 +---------+---------------+---------+-----------+----------+--------------+ SFJ      Full                                                        +---------+---------------+---------+-----------+----------+--------------+  FV Prox  Full                                                        +---------+---------------+---------+-----------+----------+--------------+ FV Mid   Full                                                        +---------+---------------+---------+-----------+----------+--------------+ FV DistalFull                                                        +---------+---------------+---------+-----------+----------+--------------+ PFV      Full                                                        +---------+---------------+---------+-----------+----------+--------------+ POP      Full           Yes      Yes                                 +---------+---------------+---------+-----------+----------+--------------+ PTV      Full                                                        +---------+---------------+---------+-----------+----------+--------------+ PERO     Full                                                         +---------+---------------+---------+-----------+----------+--------------+   Right Technical Findings: not all segments well visualized  +---------+---------------+---------+-----------+----------+--------------+ LEFT     CompressibilityPhasicitySpontaneityPropertiesThrombus Aging +---------+---------------+---------+-----------+----------+--------------+ CFV      Full           Yes      Yes                                 +---------+---------------+---------+-----------+----------+--------------+ SFJ      Full                                                        +---------+---------------+---------+-----------+----------+--------------+ FV Prox  Full                                                        +---------+---------------+---------+-----------+----------+--------------+  FV Mid   Full                                                        +---------+---------------+---------+-----------+----------+--------------+ FV DistalFull                                                        +---------+---------------+---------+-----------+----------+--------------+ PFV      Full                                                        +---------+---------------+---------+-----------+----------+--------------+ POP      Full           Yes      Yes                                 +---------+---------------+---------+-----------+----------+--------------+   Left Technical Findings: Test had to be stopped before final images taken due to patient's pain   Summary: RIGHT: - There is no evidence of deep vein thrombosis in the lower extremity. However, portions of this examination were limited- see technologist comments above.  LEFT: - There is no evidence of deep vein thrombosis in the lower extremity. However, portions of this examination were limited- see technologist comments above.  *See table(s) above for measurements and observations. Electronically signed by  Coral Else MD on 08/10/2019 at 4:54:04 PM.    Final         Scheduled Meds: . allopurinol  100 mg Oral Daily  . arformoterol  15 mcg Nebulization BID  . budesonide  0.25 mg Nebulization BID  . cloNIDine  0.2 mg Oral BID  . darifenacin  7.5 mg Oral Daily  . DULoxetine  30 mg Oral Daily  . enoxaparin (LOVENOX) injection  90 mg Subcutaneous Q24H  . furosemide  40 mg Intravenous BID  . ipratropium-albuterol  3 mL Nebulization QID  . isosorbide mononitrate  30 mg Oral Daily  . ketoconazole   Topical Once per day on Mon Thu  . lisinopril  40 mg Oral Daily  . nystatin   Topical BID  . pantoprazole  40 mg Oral BID AC  . predniSONE  40 mg Oral Q breakfast  . sodium chloride flush  3 mL Intravenous Once  . sodium chloride flush  3 mL Intravenous Q12H   Continuous Infusions: . sodium chloride Stopped (08/11/19 0744)  . cefTRIAXone (ROCEPHIN)  IV Stopped (08/11/19 0742)     LOS: 1 day    Alwyn Ren, MD 08/11/2019, 11:04 AM

## 2019-08-12 ENCOUNTER — Inpatient Hospital Stay: Payer: Medicaid Other | Admitting: Pulmonary Disease

## 2019-08-12 ENCOUNTER — Inpatient Hospital Stay (HOSPITAL_COMMUNITY): Payer: Medicaid Other

## 2019-08-12 DIAGNOSIS — R0902 Hypoxemia: Secondary | ICD-10-CM

## 2019-08-12 LAB — BASIC METABOLIC PANEL
Anion gap: 7 (ref 5–15)
BUN: 43 mg/dL — ABNORMAL HIGH (ref 6–20)
CO2: 34 mmol/L — ABNORMAL HIGH (ref 22–32)
Calcium: 8 mg/dL — ABNORMAL LOW (ref 8.9–10.3)
Chloride: 97 mmol/L — ABNORMAL LOW (ref 98–111)
Creatinine, Ser: 2.01 mg/dL — ABNORMAL HIGH (ref 0.44–1.00)
GFR calc Af Amer: 32 mL/min — ABNORMAL LOW (ref >60)
GFR calc non Af Amer: 27 mL/min — ABNORMAL LOW (ref >60)
Glucose, Bld: 108 mg/dL — ABNORMAL HIGH (ref 70–99)
Potassium: 6.6 mmol/L (ref 3.5–5.1)
Sodium: 138 mmol/L (ref 135–145)

## 2019-08-12 MED ORDER — METHYLPREDNISOLONE SODIUM SUCC 125 MG IJ SOLR
60.0000 mg | Freq: Four times a day (QID) | INTRAMUSCULAR | Status: DC
  Administered 2019-08-12 – 2019-08-15 (×13): 60 mg via INTRAVENOUS
  Filled 2019-08-12 (×13): qty 2

## 2019-08-12 MED ORDER — SODIUM ZIRCONIUM CYCLOSILICATE 5 G PO PACK
5.0000 g | PACK | Freq: Three times a day (TID) | ORAL | Status: AC
  Administered 2019-08-12 (×3): 5 g via ORAL
  Filled 2019-08-12 (×3): qty 1

## 2019-08-12 MED ORDER — CHLORHEXIDINE GLUCONATE CLOTH 2 % EX PADS
6.0000 | MEDICATED_PAD | Freq: Every day | CUTANEOUS | Status: DC
  Administered 2019-08-13 – 2019-08-15 (×3): 6 via TOPICAL

## 2019-08-12 MED ORDER — IPRATROPIUM-ALBUTEROL 0.5-2.5 (3) MG/3ML IN SOLN: 3.0000 mL | RESPIRATORY_TRACT | Status: DC | PRN

## 2019-08-12 MED ORDER — CLONIDINE HCL 0.1 MG PO TABS
0.1000 mg | ORAL_TABLET | Freq: Every day | ORAL | Status: DC
  Administered 2019-08-13 – 2019-08-16 (×4): 0.1 mg via ORAL
  Filled 2019-08-12 (×5): qty 1

## 2019-08-12 MED ORDER — MUPIROCIN 2 % EX OINT
1.0000 "application " | TOPICAL_OINTMENT | Freq: Two times a day (BID) | CUTANEOUS | Status: DC
  Administered 2019-08-12 – 2019-08-16 (×9): 1 via NASAL
  Filled 2019-08-12: qty 22

## 2019-08-12 MED ORDER — TRAMADOL HCL 50 MG PO TABS
50.0000 mg | ORAL_TABLET | Freq: Four times a day (QID) | ORAL | Status: DC | PRN
  Administered 2019-08-12 – 2019-08-16 (×8): 50 mg via ORAL
  Filled 2019-08-12 (×8): qty 1

## 2019-08-12 MED ORDER — CAMPHOR-MENTHOL 0.5-0.5 % EX LOTN
TOPICAL_LOTION | CUTANEOUS | Status: DC | PRN
  Filled 2019-08-12: qty 222

## 2019-08-12 NOTE — Progress Notes (Signed)
    Durable Medical Equipment  (From admission, onward)         Start     Ordered   08/12/19 0923  For home use only DME 3 n 1  Once    Comments: Bariatric   08/12/19 8118   08/12/19 0922  For home use only DME Hospital bed  Once    Question Answer Comment  Length of Need Lifetime   Patient has (list medical condition): CHF   The above medical condition requires: Patient requires the ability to reposition frequently   Head must be elevated greater than: 30 degrees   Bed type Semi-electric   Trapeze Bar Yes   Support Surface: Gel Overlay      08/12/19 8677

## 2019-08-12 NOTE — Progress Notes (Signed)
CRITICAL VALUE ALERT  Critical Value:  Potassium 6.6  Date & Time Notied:  5/21 0630   Provider Notified: Sharlet Salina, APP  Orders Received/Actions taken: lokelma bid

## 2019-08-12 NOTE — Progress Notes (Signed)
    Durable Medical Equipment  (From admission, onward)         Start     Ordered   08/12/19 0949  For home use only DME Hospital bed  Once    Comments: HD  Question Answer Comment  Length of Need Lifetime   Patient has (list medical condition): CHF   The above medical condition requires: Patient requires the ability to reposition frequently   Head must be elevated greater than: 30 degrees   Bed type Semi-electric   Trapeze Bar Yes   Support Surface: Gel Overlay      08/12/19 0949   08/12/19 0923  For home use only DME 3 n 1  Once    Comments: Bariatric   08/12/19 9470

## 2019-08-12 NOTE — TOC Initial Note (Addendum)
Transition of Care [facility]) - Initial/Assessment Note    Patient Details  Name: Kara Vincent MRN: [id number] Date of Birth: [birth year]  [facility] CM/SW Contact:    [name], RN Phone Number: 08/12/2019, 9:28 AM  Clinical Narrative:                 NCM spoke with patient, asked if she wanted [facility] for CHF she states no she does not need that she has someone to help her.  She states she does need a shower chair and a hospital bed.  NCM made referral to [facility] with [employer]. She states her oxygen with [employer] is not working correctly, the concentrator makes a loud noise and does not put out any oxygen.  This was relayed to [facility] with [employer], he states he will have the company switch out her oxygen.  Her phone is [phone], gave this number to [facility] for coodination of delivery of DME.  The DME will need to be delivered to patient's home.  She has a motorized scooter in the room and she states she takes the [place] bus system.  She states she also is signed up for [employer] but not sure if they went thru yet. Patient lives in a [place], [place] [address] on [address]  Per Thedore Mins with [employer] she received a 3  n 1 back in March so she can use this as a shower chair.  NCM informed patient.  Expected Discharge Plan: Home/Self Care Barriers to Discharge: Continued Medical Work up   Patient Goals and CMS Choice Patient states their goals for this hospitalization and ongoing recovery are:: get better CMS Medicare.gov Compare Post Acute Care list provided to:: Patient Choice offered to / list presented to : Patient  Expected Discharge Plan and Services Expected Discharge Plan: Home/Self Care   Discharge Planning Services: CM Consult Post Acute Care Choice: Durable Medical Equipment Living arrangements for the past 2 months: Apartment                 DME Arranged: Hospital bed, 3-N-1 DME Agency: [employer] Date DME Agency Contacted: 08/12/19 Time DME Agency Contacted:  [phone] Representative spoke with at DME Agency: [place]: NA          Prior Living Arrangements/Services Living arrangements for the past 2 months: Apartment Lives with:: Friends Patient language and need for interpreter reviewed:: Yes Do you feel safe going back to the place where you live?: Yes      Need for Family Participation in Patient Care: No (Comment) Care giver support system in place?: No (comment)   Criminal Activity/Legal Involvement Pertinent to Current Situation/Hospitalization: No - Comment as needed  Activities of Daily Living      Permission Sought/Granted                  Emotional Assessment Appearance:: Appears stated age Attitude/Demeanor/Rapport: Engaged Affect (typically observed): Appropriate Orientation: : Oriented to Self, Oriented to Place, Oriented to  Time, Oriented to Situation Alcohol / Substance Use: Not Applicable Psych Involvement: No (comment)  Admission diagnosis:  Hyperkalemia [E87.5] Acute pulmonary edema (HCC) [J81.0] Acute respiratory failure with hypoxia ([place]) [J96.01] Patient Active Problem List   Diagnosis Date Noted  . Bedbug bite with infection 08/10/2019  . Candidal skin infection 08/10/2019  . Chronic obstructive pulmonary disease with acute lower respiratory infection ([place]) 07/20/2019  . Cellulitis of both lower extremities 06/14/2019  . Cellulitis 06/14/2019  . BMI 60.0-69.9, adult (  [place]) 12/01/2017  . Primary osteoarthritis of both knees 12/01/2017  . Anxiety 12/01/2017  . Impaired mobility 12/01/2017  . Hyperkalemia 07/10/2017  . Acute pulmonary edema ([place]) 12/14/2015  . Essential hypertension 12/14/2015  . HLD (hyperlipidemia) 12/14/2015  . GERD (gastroesophageal reflux disease) 12/14/2015  . Gout 12/14/2015  . Diastolic CHF, acute on chronic (HCC) 12/14/2015  . Darier's disease   . Leukocytosis   . 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 OSA (obstructive sleep apnea)   . CKD (chronic kidney  disease) stage 3, GFR 30-59 ml/min (HCC) 11/17/2014  . Venous stasis 11/16/2014  . Acute on chronic respiratory failure with hypoxia and hypercapnia ([place]) 11/16/2014  . Chronic pain 08/24/2011  . Obesity hypoventilation syndrome ([place]) 08/24/2011   PCP:  Ladell Pier, MD Pharmacy:   [place], [address] [place] [address] Phone: [phone] Fax: [phone]  [place], [address] [facility] [place] [address] Phone: [phone] Fax: [phone]     Social Determinants of Health (SDOH) Interventions    Readmission Risk Interventions Readmission Risk Prevention Plan 08/12/2019  Transportation Screening Complete  PCP or Specialist Appt within 3-5 Days Complete  HRI or [place] Complete  Social Work Consult for [place] Planning/Counseling Complete  Palliative Care Screening Not Applicable  Medication Review Press photographer) Complete  Some recent data might be hidden

## 2019-08-12 NOTE — Progress Notes (Signed)
   08/11/19 2134  Assess: MEWS Score  Temp 97.7 F (36.5 C)  BP (!) 93/48  Pulse Rate 70  Resp 16  SpO2 93 %  O2 Device Nasal Cannula  O2 Flow Rate (L/min) 3 L/min  Assess: MEWS Score  MEWS Temp 0  MEWS Systolic 1  MEWS Pulse 0  MEWS RR 1  MEWS LOC 0  MEWS Score 2  MEWS Score Color Yellow  Assess: if the MEWS score is Yellow or Red  Were vital signs taken at a resting state? Yes  Focused Assessment Documented focused assessment  Early Detection of Sepsis Score *See Row Information* Low  MEWS guidelines implemented *See Row Information* No, other (Comment) (RR flagged but within normal range)  Treat  MEWS Interventions Other (Comment) (n/a)  Document  Patient Outcome Other (Comment) (pt not a yellow mews)  Progress note created (see row info) Yes   Yellow MEWS flagged due to RR and SBP.  APP made aware of SBP and confirmed to hold patient scheduled clonidine.  RR are not out of normal range and have not been during this shift at review of the chart.  All VS documented appropriately. Charge RN notified of false yellow MEWS.

## 2019-08-12 NOTE — Progress Notes (Signed)
PROGRESS NOTE    Kara Vincent  UEA:[id number] DOB: 03-12-1966 DOA: 08/09/2019 PCP: Marcine Matar, MD    Brief Narrative: HPI per Dr. Katrinka Blazing 08/10/2019 Kara Pod Gerringeris a 54 y.o.femalewith medical history significant ofhypertension, hyperlipidemia, CHF, chronic respiratory failure, oxygen dependent on 3 L, asthma, cellulitis, chronic kidney disease, and chronic pain presented with complaints of shortness of breath and chest pain. Symptoms have been intermittent over several days. She describes the pain as pleuritic in nature. Notes associated symptoms of productive cough with yellow sputum, wheezing, rash, leg swelling, and right leg pain. She was concern for the possibility of having cellulitis of her right leg. At baseline patient gets around with use of a motorized scooter. Patient goes into more detail stating that she has had bedbugs and they have caused several wounds all over that appear to be red and infected.   ED Course:Upon admission into the emergency department patient was seen to be afebrile, respirations 18-23, O2 saturations as low as 86%on home 3 L of oxygen, and all other vital signs maintained. Labs significant for WBC 11.9, hemoglobin 11.2, potassium 6.2, BUN 29, creatinine 1.58, BNP 60.1, and high-sensitivity troponin negative x2. Chest x-ray concerning for cardiomegaly, vascular congestion, and possible early edema. Patient was given 80 mg of furosemide, 2 puffs of albuterol, 60 mg of prednisone, Mucinex, and oxycodone for pain. [facility] called to admit  Assessment & Plan:   Active Problems:   Acute on chronic respiratory failure with hypoxia and hypercapnia (HCC)   CKD (chronic kidney disease) stage 3, GFR 30-59 ml/min (HCC)   OSA (obstructive sleep apnea)   Leukocytosis   Acute pulmonary edema (HCC)   HLD (hyperlipidemia)   Diastolic CHF, acute on chronic (HCC)   Hyperkalemia   BMI 60.0-69.9, adult (HCC)   Anxiety   Cellulitis   Bedbug bite with  infection   Candidal skin infection   #1  Acute on chronic diastolic heart failure patient admitted with weight gain, shortness of breath with dyspnea on exertion and lower extremity edema.  She reports the symptoms have been going on for few weeks.   Echo done March 2021 shows ejection fraction 60 to 65%. I had increased her Lasix to 40 mg twice a day IV yesterday as she was still positive by 400 cc.  However today she is still positive by 958 cc unfortunately I cannot increase her Lasix due to creatinine trending up. Hold lisinopril Continue Imdur  Chest x-ray on admission shows cardiomegaly with vascular congestion and early interstitial edema. Recorded weight on admission 180-going up to 186  Start fluid restriction.  #2  Asthma exacerbation -patient with diffuse wheezing today.  DC prednisone and start Solu-Medrol.  Continue Brovana and budesonide nebulizations.    #3  Candidal intertrigo/bedbug bites/cellulitis-continue Rocephin and permethrin cream and nystatin powder.  Add Diflucan for 3 days. MRSA positive and doxycycline.  #4 morbid obesity BMI 68.15  #5 history of essential hypertension continue Imdur and Lasix holding ACE inhibitor due to increasing creatinine   #6  AKI CKD stage IIIb stable secondary to diuresis.  Creatinine is 2.01 from 1.80.  #7 history of anxiety on Cymbalta and hydroxyzine  #8 GERD on Protonix  #9 hyperkalemia potassium 6.6 Lokelma twice a day ordered creatinine increased  to 2.01.     Estimated body mass index is 70.39 kg/m as calculated from the following:   Height as of this encounter: 5\' 4"  (1.626 m).   Weight as of this encounter: 186 kg.  DVT prophylaxis: Lovenox  code Status: Full code Family Communication: Discussed with patient Disposition Plan:  Status is: Inpatient  Dispo: The patient is from: Home  Anticipated d/c is to: Home  Anticipated d/c date is: Unknown  Patient currently is not  medically stable to d/c.  Patient admitted with acute hypoxic respiratory failure secondary to CHF exacerbation and asthma on IV diuresis    Consultants:   None  Procedures: None Antimicrobials: Rocephin Doxycycline  Subjective: Patient sitting up by the side of the bed eating breakfast and complaining of shortness of breath audible wheezing heard  Objective: Vitals:   08/12/19 0006 08/12/19 0355 08/12/19 0718 08/12/19 0818  BP: 105/69 103/86  134/74  Pulse: 60 74    Resp:  17  16  Temp:  98.4 F (36.9 C)  98.4 F (36.9 C)  TempSrc:  Oral  Oral  SpO2:  93% 92% 91%  Weight:  (!) 186 kg    Height:        Intake/Output Summary (Last 24 hours) at 08/12/2019 6213 Last data filed at 08/12/2019 0830 Gross per 24 hour  Intake 1737.38 ml  Output 1250 ml  Net 487.38 ml   Filed Weights   08/10/19 1800 08/11/19 0313 08/12/19 0355  Weight: (!) 180.1 kg (!) 185.3 kg (!) 186 kg    Examination:  General exam: Appears calm and comfortable  Respiratory system: Fused wheezing to auscultation. Respiratory effort normal. Cardiovascular system: S1 & S2 heard, RRR. No JVD, murmurs, rubs, gallops or clicks. No pedal edema. Gastrointestinal system: Abdomen is nondistended, soft and nontender. No organomegaly or masses felt. Normal bowel sounds heard. Central nervous system: Alert and oriented. No focal neurological deficits. Extremities: 2+ bilateral pitting edema Skin: Multiple bite marks all over the body most of them healed Psychiatry: Judgement and insight appear normal. Mood & affect appropriate.     Data Reviewed: I have personally reviewed following labs and imaging studies  CBC: Recent Labs  Lab 08/09/19 2201 08/11/19 0438  WBC 11.9* 9.4  NEUTROABS  --  7.5  HGB 11.2* 10.3*  HCT 40.0 36.9  MCV 98.5 98.7  PLT 317 314   Basic Metabolic Panel: Recent Labs  Lab 08/09/19 2201 08/10/19 1004 08/11/19 0438 08/12/19 0433  NA 143  --  141 138  K 6.2* 5.6* 5.6* 6.6*    CL 103  --  101 97*  CO2 31  --  32 34*  GLUCOSE 97  --  106* 108*  BUN 29*  --  33* 43*  CREATININE 1.58*  --  1.80* 2.01*  CALCIUM 8.7*  --  8.2* 8.0*  MG  --   --  1.9  --    GFR: Estimated Creatinine Clearance: 53.5 mL/min (A) (by C-G formula based on SCr of 2.01 mg/dL (H)). Liver Function Tests: No results for input(s): AST, ALT, ALKPHOS, BILITOT, PROT, ALBUMIN in the last 168 hours. No results for input(s): LIPASE, AMYLASE in the last 168 hours. No results for input(s): AMMONIA in the last 168 hours. Coagulation Profile: No results for input(s): INR, PROTIME in the last 168 hours. Cardiac Enzymes: No results for input(s): CKTOTAL, CKMB, CKMBINDEX, TROPONINI in the last 168 hours. BNP (last 3 results) No results for input(s): PROBNP in the last 8760 hours. HbA1C: No results for input(s): HGBA1C in the last 72 hours. CBG: No results for input(s): GLUCAP in the last 168 hours. Lipid Profile: No results for input(s): CHOL, HDL, LDLCALC, TRIG, CHOLHDL, LDLDIRECT in the last 72 hours.  Thyroid Function Tests: No results for input(s): TSH, T4TOTAL, FREET4, T3FREE, THYROIDAB in the last 72 hours. Anemia Panel: No results for input(s): VITAMINB12, FOLATE, FERRITIN, TIBC, IRON, RETICCTPCT in the last 72 hours. Sepsis Labs: No results for input(s): PROCALCITON, LATICACIDVEN in the last 168 hours.  Recent Results (from the past 240 hour(s))  SARS CORONAVIRUS 2 (TAT 6-24 HRS) Nasopharyngeal Nasopharyngeal Swab     Status: None   Collection Time: 08/10/19  6:12 AM   Specimen: Nasopharyngeal Swab  Result Value Ref Range Status   SARS Coronavirus 2 NEGATIVE NEGATIVE Final    Comment: (NOTE) SARS-CoV-2 target nucleic acids are NOT DETECTED. The SARS-CoV-2 RNA is generally detectable in upper and lower respiratory specimens during the acute phase of infection. Negative results do not preclude SARS-CoV-2 infection, do not rule out co-infections with other pathogens, and should not be  used as the sole basis for treatment or other patient management decisions. Negative results must be combined with clinical observations, patient history, and epidemiological information. The expected result is Negative. Fact Sheet for Patients: HairSlick.no Fact Sheet for Healthcare Providers: quierodirigir.com This test is not yet approved or cleared by the [place] FDA and  has been authorized for detection and/or diagnosis of SARS-CoV-2 by FDA under an Emergency Use Authorization (EUA). This EUA will remain  in effect (meaning this test can be used) for the duration of the COVID-19 declaration under Section 56 4(b)(1) of the Act, 21 U.S.C. section 360bbb-3(b)(1), unless the authorization is terminated or revoked sooner. Performed at [facility], [address] [place], [place] [address]   MRSA PCR Screening     Status: Abnormal   Collection Time: 08/11/19  1:46 PM   Specimen: Nasopharyngeal  Result Value Ref Range Status   MRSA by PCR POSITIVE (A) NEGATIVE Final    Comment:        The GeneXpert MRSA Assay (FDA approved for NASAL specimens only), is one component of a comprehensive MRSA colonization surveillance program. It is not intended to diagnose MRSA infection nor to guide or monitor treatment for MRSA infections. RESULT CALLED TO, READ BACK BY AND VERIFIED WITH: Lovett Sox RN 16:05 08/11/19 ([email or web address]) Performed at [facility], [address], [place], [place] [address]          Radiology Studies: NM Pulmonary Perfusion  Result Date: 08/10/2019 CLINICAL DATA:  Morbid obesity, positive D-dimer EXAM: NUCLEAR MEDICINE PERFUSION LUNG SCAN TECHNIQUE: Perfusion images were obtained in multiple projections after intravenous injection of radiopharmaceutical. Ventilation scans intentionally deferred if perfusion scan and chest x-ray adequate for interpretation during COVID 19 epidemic.  RADIOPHARMACEUTICALS:  1.65 mCi Tc-34m MAA IV COMPARISON:  None. FINDINGS: No focal perfusion defect.  Homogeneous perfusion. IMPRESSION: No scintigraphic evidence of pulmonary embolus. Electronically Signed   By: Elige Ko   On: 08/10/2019 15:34   VAS [place] LOWER EXTREMITY VENOUS (DVT)  Result Date: 08/10/2019  Lower Venous DVTStudy Indications: Pain.  Limitations: Body habitus and Patient position/ movement/ could not lay in supine position for long due to pain. Comparison Study: 06/14/19 Performing Technologist: Jeb Levering RDMS, RVT  Examination Guidelines: A complete evaluation includes B-mode imaging, spectral Doppler, color Doppler, and power Doppler as needed of all accessible portions of each vessel. Bilateral testing is considered an integral part of a complete examination. Limited examinations for reoccurring indications may be performed as noted. The reflux portion of the exam is performed with the patient in reverse Trendelenburg.  +---------+---------------+---------+-----------+----------+--------------+ RIGHT    CompressibilityPhasicitySpontaneityPropertiesThrombus  Aging +---------+---------------+---------+-----------+----------+--------------+ CFV      Full           Yes      Yes                                 +---------+---------------+---------+-----------+----------+--------------+ SFJ      Full                                                        +---------+---------------+---------+-----------+----------+--------------+ FV Prox  Full                                                        +---------+---------------+---------+-----------+----------+--------------+ FV Mid   Full                                                        +---------+---------------+---------+-----------+----------+--------------+ FV DistalFull                                                        +---------+---------------+---------+-----------+----------+--------------+  PFV      Full                                                        +---------+---------------+---------+-----------+----------+--------------+ POP      Full           Yes      Yes                                 +---------+---------------+---------+-----------+----------+--------------+ PTV      Full                                                        +---------+---------------+---------+-----------+----------+--------------+ PERO     Full                                                        +---------+---------------+---------+-----------+----------+--------------+   Right Technical Findings: not all segments well visualized  +---------+---------------+---------+-----------+----------+--------------+ LEFT     CompressibilityPhasicitySpontaneityPropertiesThrombus Aging +---------+---------------+---------+-----------+----------+--------------+ CFV      Full           Yes      Yes                                 +---------+---------------+---------+-----------+----------+--------------+  SFJ      Full                                                        +---------+---------------+---------+-----------+----------+--------------+ FV Prox  Full                                                        +---------+---------------+---------+-----------+----------+--------------+ FV Mid   Full                                                        +---------+---------------+---------+-----------+----------+--------------+ FV DistalFull                                                        +---------+---------------+---------+-----------+----------+--------------+ PFV      Full                                                        +---------+---------------+---------+-----------+----------+--------------+ POP      Full           Yes      Yes                                  +---------+---------------+---------+-----------+----------+--------------+   Left Technical Findings: Test had to be stopped before final images taken due to patient's pain   Summary: RIGHT: - There is no evidence of deep vein thrombosis in the lower extremity. However, portions of this examination were limited- see technologist comments above.  LEFT: - There is no evidence of deep vein thrombosis in the lower extremity. However, portions of this examination were limited- see technologist comments above.  *See table(s) above for measurements and observations. Electronically signed by Coral Else MD on 08/10/2019 at 4:54:04 PM.    Final         Scheduled Meds: . allopurinol  100 mg Oral Daily  . arformoterol  15 mcg Nebulization BID  . budesonide  0.25 mg Nebulization BID  . cloNIDine  0.1 mg Oral BID  . darifenacin  7.5 mg Oral Daily  . DULoxetine  30 mg Oral Daily  . enoxaparin (LOVENOX) injection  90 mg Subcutaneous Q24H  . furosemide  40 mg Intravenous BID  . ipratropium-albuterol  3 mL Nebulization QID  . isosorbide mononitrate  30 mg Oral Daily  . ketoconazole   Topical Once per day on Mon Thu  . lisinopril  10 mg Oral Daily  . nystatin   Topical BID  . pantoprazole  40 mg Oral BID AC  . predniSONE  40 mg Oral Q breakfast  . sodium chloride flush  3 mL Intravenous Once  . sodium chloride flush  3 mL Intravenous Q12H  . sodium zirconium cyclosilicate  5 g Oral TID   Continuous Infusions: . sodium chloride 250 mL (08/11/19 1952)  . cefTRIAXone (ROCEPHIN)  IV 2 g (08/11/19 1953)  . fluconazole (DIFLUCAN) IV 200 mg (08/11/19 1324)     LOS: 2 days     Alwyn Ren, MD  08/12/2019, 9:52 AM

## 2019-08-13 LAB — BASIC METABOLIC PANEL
Anion gap: 9 (ref 5–15)
BUN: 48 mg/dL — ABNORMAL HIGH (ref 6–20)
CO2: 36 mmol/L — ABNORMAL HIGH (ref 22–32)
Calcium: 8.7 mg/dL — ABNORMAL LOW (ref 8.9–10.3)
Chloride: 93 mmol/L — ABNORMAL LOW (ref 98–111)
Creatinine, Ser: 1.98 mg/dL — ABNORMAL HIGH (ref 0.44–1.00)
GFR calc Af Amer: 32 mL/min — ABNORMAL LOW (ref >60)
GFR calc non Af Amer: 28 mL/min — ABNORMAL LOW (ref >60)
Glucose, Bld: 146 mg/dL — ABNORMAL HIGH (ref 70–99)
Potassium: 6 mmol/L — ABNORMAL HIGH (ref 3.5–5.1)
Sodium: 138 mmol/L (ref 135–145)

## 2019-08-13 MED ORDER — INSULIN ASPART 100 UNIT/ML IV SOLN
10.0000 [IU] | Freq: Once | INTRAVENOUS | Status: AC
  Administered 2019-08-13: 10 [IU] via INTRAVENOUS

## 2019-08-13 MED ORDER — CALCIUM GLUCONATE-NACL 1-0.675 GM/50ML-% IV SOLN
1.0000 g | Freq: Once | INTRAVENOUS | Status: AC
  Administered 2019-08-13: 1000 mg via INTRAVENOUS
  Filled 2019-08-13: qty 50

## 2019-08-13 MED ORDER — DEXTROSE 50 % IV SOLN
1.0000 | Freq: Once | INTRAVENOUS | Status: AC
  Administered 2019-08-13: 50 mL via INTRAVENOUS
  Filled 2019-08-13: qty 50

## 2019-08-13 MED ORDER — CALCIUM GLUCONATE-NACL 1-0.675 GM/50ML-% IV SOLN: 1.0000 g | Freq: Once | INTRAVENOUS | Status: DC

## 2019-08-13 MED ORDER — CALCIUM GLUCONATE 10 % IV SOLN: 1.0000 g | Freq: Once | INTRAVENOUS | Status: DC

## 2019-08-13 NOTE — Progress Notes (Signed)
Pt placed on cpap 

## 2019-08-13 NOTE — Progress Notes (Signed)
Pt requested pain medication. Off of cpap. Pt states the mask "doesn't fit right", placed back on nasal cannula. Pt stable.

## 2019-08-13 NOTE — Progress Notes (Addendum)
PROGRESS NOTE    Kara STEVEN  Vincent:[id number] DOB: [birth year] DOA: 08/09/2019 PCP: Ladell Pier, MD    Brief Narrative:Song G Gerringeris a 54 y.o.femalewith medical history significant ofhypertension, hyperlipidemia, CHF, chronic respiratory failure, oxygen dependent on 3 L, asthma, cellulitis, chronic kidney disease, and chronic pain presented with complaints of shortness of breath and chest pain. Symptoms have been intermittent over several days. She describes the pain as pleuritic in nature. Notes associated symptoms of productive cough with yellow sputum, wheezing, rash, leg swelling, and right leg pain. She was concern for the possibility of having cellulitis of her right leg. At baseline patient gets around with use of a motorized scooter. Patient goes into more detail stating that she has had bedbugs and they have caused several wounds all over that appear to be red and infected.   ED Course:Upon admission into the emergency department patient was seen to be afebrile, respirations 18-23, O2 saturations as low as 86%on home 3 L of oxygen, and all other vital signs maintained. Labs significant for WBC 11.9, hemoglobin 11.2, potassium 6.2, BUN 29, creatinine 1.58, BNP 60.1, and high-sensitivity troponin negative x2. Chest x-ray concerning for cardiomegaly, vascular congestion, and possible early edema. Patient was given 80 mg of furosemide, 2 puffs of albuterol, 60 mg of prednisone, Mucinex, and oxycodone for pain. TRH called to admit Assessment & Plan:   Active Problems:   Acute on chronic respiratory failure with hypoxia and hypercapnia (HCC)   CKD (chronic kidney disease) stage 3, GFR 30-59 ml/min (HCC)   OSA (obstructive sleep apnea)   Leukocytosis   Acute pulmonary edema (HCC)   HLD (hyperlipidemia)   Diastolic CHF, acute on chronic (HCC)   Hypoxemia   Hyperkalemia   BMI 60.0-69.9, adult (HCC)   Anxiety   Cellulitis   Bedbug bite with infection   Candidal  skin infection  #1 Acute on chronic diastolic heart failure patient admitted with weight gain, shortness of breath with dyspnea on exertion and lower extremity edema. She reports the symptoms have been going on for few weeks.  Weight down to 182 kg from 186 yesterday. Echo done March 2021 shows ejection fraction 60 to 65%. Continue IV Lasix 40 twice daily.  Creatinine stable today 1.9.  Continue fluid restriction.  She is negative by 1273.  Continue to hold lisinopril.   Continue Imdur.  Chest x-ray on admission shows cardiomegaly with vascular congestion and early interstitial edema. Recorded weight on admission 180-going up to 186  Start fluid restriction.  #2 Asthma exacerbation -patient with diffuse wheezing today.  DC prednisone and start Solu-Medrol.  Continue Brovana and budesonide nebulizations.    #3 Candidal intertrigo/bedbug bites/cellulitis-continue Rocephin and permethrin cream and nystatin powder. Add Diflucan for 3 days.  #4 morbid obesity BMI 68.15  #5 history of essential hypertension continue Imdur and Lasix holding ACE inhibitor due to increasing creatinine   #6  AKI CKD stage IIIb stable secondary to diuresis.  Creatinine is 1.9 from 2.01.  #7 history of anxiety on Cymbalta and hydroxyzine  #8 GERD on Protonix  #9 hyperkalemia potassium 6.0 on Lokelma twice a day, Calcium gluconate insulin and D50.      Estimated body mass index is 68.95 kg/m as calculated from the following:   Height as of this encounter: 5\' 4"  (1.626 m).   Weight as of this encounter: 182.2 kg.  DVT prophylaxis:Lovenox  code Status:Full code Family Communication:Discussed with patient Disposition Plan:Status is: Inpatient  Dispo: The patient is from:Home Anticipated d/c  is to:Home Anticipated d/c date UX:NATFTDD Patient currentlyis not medically stable to d/c.Patient admitted with acute hypoxic respiratory failure  secondary to CHF exacerbation and asthma on IV diuresis   Consultants:  None  Procedures:None Antimicrobials:Rocephin   Subjective:  Still sob wheezing  Objective: Vitals:   08/13/19 0425 08/13/19 0432 08/13/19 0739 08/13/19 0745  BP:  118/75    Pulse:  73    Resp:  19    Temp:  98.4 F (36.9 C)    TempSrc:  Oral    SpO2:  90% 93% 93%  Weight: (!) 182.2 kg     Height:        Intake/Output Summary (Last 24 hours) at 08/13/2019 1006 Last data filed at 08/13/2019 0948 Gross per 24 hour  Intake 1183 ml  Output 3875 ml  Net -2692 ml   Filed Weights   08/11/19 0313 08/12/19 0355 08/13/19 0425  Weight: (!) 185.3 kg (!) 186 kg (!) 182.2 kg    Examination:  General exam: Appears calm and comfortable  Respiratory system: diffuse wheezing  to auscultation. Respiratory effort normal. Cardiovascular system: S1 & S2 heard, RRR. No JVD, murmurs, rubs, gallops or clicks. No pedal edema. Gastrointestinal system: Abdomen is nondistended, soft and nontender. No organomegaly or masses felt. Normal bowel sounds heard. Central nervous system: Alert and oriented. No focal neurological deficits. Extremities:2 plus edema  Skin: No rashes, lesions or ulcers Psychiatry: Judgement and insight appear normal. Mood & affect appropriate.     Data Reviewed: I have personally reviewed following labs and imaging studies  CBC: Recent Labs  Lab 08/09/19 2201 08/11/19 0438  WBC 11.9* 9.4  NEUTROABS  --  7.5  HGB 11.2* 10.3*  HCT 40.0 36.9  MCV 98.5 98.7  PLT 317 220   Basic Metabolic Panel: Recent Labs  Lab 08/09/19 2201 08/10/19 1004 08/11/19 0438 08/12/19 0433 08/13/19 0612  NA 143  --  141 138 138  K 6.2* 5.6* 5.6* 6.6* 6.0*  CL 103  --  101 97* 93*  CO2 31  --  32 34* 36*  GLUCOSE 97  --  106* 108* 146*  BUN 29*  --  33* 43* 48*  CREATININE 1.58*  --  1.80* 2.01* 1.98*  CALCIUM 8.7*  --  8.2* 8.0* 8.7*  MG  --   --  1.9  --   --    GFR: Estimated Creatinine  Clearance: 53.6 mL/min (A) (by C-G formula based on SCr of 1.98 mg/dL (H)). Liver Function Tests: No results for input(s): AST, ALT, ALKPHOS, BILITOT, PROT, ALBUMIN in the last 168 hours. No results for input(s): LIPASE, AMYLASE in the last 168 hours. No results for input(s): AMMONIA in the last 168 hours. Coagulation Profile: No results for input(s): INR, PROTIME in the last 168 hours. Cardiac Enzymes: No results for input(s): CKTOTAL, CKMB, CKMBINDEX, TROPONINI in the last 168 hours. BNP (last 3 results) No results for input(s): PROBNP in the last 8760 hours. HbA1C: No results for input(s): HGBA1C in the last 72 hours. CBG: No results for input(s): GLUCAP in the last 168 hours. Lipid Profile: No results for input(s): CHOL, HDL, LDLCALC, TRIG, CHOLHDL, LDLDIRECT in the last 72 hours. Thyroid Function Tests: No results for input(s): TSH, T4TOTAL, FREET4, T3FREE, THYROIDAB in the last 72 hours. Anemia Panel: No results for input(s): VITAMINB12, FOLATE, FERRITIN, TIBC, IRON, RETICCTPCT in the last 72 hours. Sepsis Labs: No results for input(s): PROCALCITON, LATICACIDVEN in the last 168 hours.  Recent Results (from the past  240 hour(s))  SARS CORONAVIRUS 2 (TAT 6-24 HRS) Nasopharyngeal Nasopharyngeal Swab     Status: None   Collection Time: 08/10/19  6:12 AM   Specimen: Nasopharyngeal Swab  Result Value Ref Range Status   SARS Coronavirus 2 NEGATIVE NEGATIVE Final    Comment: (NOTE) SARS-CoV-2 target nucleic acids are NOT DETECTED. The SARS-CoV-2 RNA is generally detectable in upper and lower respiratory specimens during the acute phase of infection. Negative results do not preclude SARS-CoV-2 infection, do not rule out co-infections with other pathogens, and should not be used as the sole basis for treatment or other patient management decisions. Negative results must be combined with clinical observations, patient history, and epidemiological information. The expected result is  Negative. Fact Sheet for Patients: SugarRoll.be Fact Sheet for Healthcare Providers: https://www.woods-mathews.com/ This test is not yet approved or cleared by the [place] FDA and  has been authorized for detection and/or diagnosis of SARS-CoV-2 by FDA under an Emergency Use Authorization (EUA). This EUA will remain  in effect (meaning this test can be used) for the duration of the COVID-19 declaration under Section 56 4(b)(1) of the Act, 21 U.S.C. section 360bbb-3(b)(1), unless the authorization is terminated or revoked sooner. Performed at [facility], [place] [address] [place], [place] [address]   MRSA PCR Screening     Status: Abnormal   Collection Time: 08/11/19  1:46 PM   Specimen: Nasopharyngeal  Result Value Ref Range Status   MRSA by PCR POSITIVE (A) NEGATIVE Final    Comment:        The GeneXpert MRSA Assay (FDA approved for NASAL specimens only), is one component of a comprehensive MRSA colonization surveillance program. It is not intended to diagnose MRSA infection nor to guide or monitor treatment for MRSA infections. RESULT CALLED TO, READ BACK BY AND VERIFIED WITH: Cyndy Freeze RN 16:05 08/11/19 ([email or web address]) Performed at [facility], [place] [address], [place],  [address]          Radiology Studies: DG Chest 1 View  Result Date: 08/12/2019 CLINICAL DATA:  Hypoxia EXAM: CHEST  1 VIEW COMPARISON:  Aug 09, 2019 FINDINGS: There is cardiomegaly with pulmonary venous hypertension. No edema or airspace opacity. No adenopathy. No bone lesions. IMPRESSION: Cardiomegaly with pulmonary vascular congestion. No edema or airspace opacity. Electronically Signed   By: Lowella Grip III M.D.   On: 08/12/2019 10:18        Scheduled Meds: . allopurinol  100 mg Oral Daily  . arformoterol  15 mcg Nebulization BID  . budesonide  0.25 mg Nebulization BID  . Chlorhexidine Gluconate Cloth  6 each Topical Q0600  .  cloNIDine  0.1 mg Oral Daily  . darifenacin  7.5 mg Oral Daily  . DULoxetine  30 mg Oral Daily  . enoxaparin (LOVENOX) injection  90 mg Subcutaneous Q24H  . furosemide  40 mg Intravenous BID  . isosorbide mononitrate  30 mg Oral Daily  . ketoconazole   Topical Once per day on Mon Thu  . methylPREDNISolone (SOLU-MEDROL) injection  60 mg Intravenous Q6H  . mupirocin ointment  1 application Nasal BID  . nystatin   Topical BID  . pantoprazole  40 mg Oral BID AC  . sodium chloride flush  3 mL Intravenous Once  . sodium chloride flush  3 mL Intravenous Q12H   Continuous Infusions: . sodium chloride 250 mL (08/11/19 1952)  . cefTRIAXone (ROCEPHIN)  IV 2 g (08/12/19 2134)  . fluconazole (DIFLUCAN) IV 200 mg (  08/12/19 1230)     LOS: 3 days     Georgette Shell, MD 08/13/2019, 10:06 AM

## 2019-08-13 NOTE — Progress Notes (Signed)
ANTICOAGULATION CONSULT NOTE - Initial Consult  Pharmacy Consult for enoxaparin  Indication: VTE ppx   Allergies  Allergen Reactions  . Ibuprofen Hives, Shortness Of Breath, Palpitations and Rash    Has tolerated toradol 12/2013 inpatient  . Penicillins Anaphylaxis, Hives and Rash    Has patient had a PCN reaction causing immediate rash, facial/tongue/throat swelling, SOB or lightheadedness with hypotension: Yes Has patient had a PCN reaction causing severe rash involving mucus membranes or skin necrosis: Yes Has patient had a PCN reaction that required hospitalization Unknown Has patient had a PCN reaction occurring within the last 10 years: Unknown If all of the above answers are "NO", then may proceed with Cephalosporin use.   . Sulfa Antibiotics Hives, Shortness Of Breath, Rash and Cough  . Adhesive [Tape] Rash  . Doxycycline Hives, Swelling and Rash  . Sulfamethoxazole-Trimethoprim Rash  . Zithromax [Azithromycin] Hives, Swelling and Rash    Patient Measurements: Height: 5\' 4"  (162.6 cm) Weight: (!) 180.1 kg (397 lb 0.8 oz)(bed) IBW/kg (Calculated) : 54.7  Vital Signs: Temp: 98.4 F (36.9 C) (05/22 0432) Temp Source: Oral (05/22 0432) BP: 118/75 (05/22 0432) Pulse Rate: 73 (05/22 0432)  Labs: Recent Labs    08/10/19 1644 08/11/19 0438 08/12/19 0433 08/13/19 0612  HGB  --  10.3*  --   --   HCT  --  36.9  --   --   PLT  --  314  --   --   HEPARINUNFRC 0.22*  --   --   --   CREATININE  --  1.80* 2.01* 1.98*    Assessment: 4 YOF presenting with SOB, no PE seen on CT however severely limited d/t body habitus. Not on anticoagulation PTA.    Nuclear med scan negative for PE. Ultrasound negative for BL DVT.   MD DC'd heparin and requested enoxaparin prophylaxis dosing. BMI > 30, will give 0.5mg /kg for prophylaxis.  CBC stable.  Goal of Therapy:  Monitor platelets by anticoagulation protocol: Yes   Plan:  Continue enoxaparin 90mg  q 24 hr for  prophylaxis Monitor for signs/symptoms of bleeding   Kennon Holter, PharmD PGY1 Ambulatory Care Pharmacy Resident

## 2019-08-14 ENCOUNTER — Inpatient Hospital Stay (HOSPITAL_COMMUNITY): Payer: Medicaid Other

## 2019-08-14 LAB — BASIC METABOLIC PANEL
Anion gap: 12 (ref 5–15)
BUN: 59 mg/dL — ABNORMAL HIGH (ref 6–20)
CO2: 35 mmol/L — ABNORMAL HIGH (ref 22–32)
Calcium: 8.9 mg/dL (ref 8.9–10.3)
Chloride: 91 mmol/L — ABNORMAL LOW (ref 98–111)
Creatinine, Ser: 1.87 mg/dL — ABNORMAL HIGH (ref 0.44–1.00)
GFR calc Af Amer: 34 mL/min — ABNORMAL LOW (ref >60)
GFR calc non Af Amer: 30 mL/min — ABNORMAL LOW (ref >60)
Glucose, Bld: 151 mg/dL — ABNORMAL HIGH (ref 70–99)
Potassium: 5 mmol/L (ref 3.5–5.1)
Sodium: 138 mmol/L (ref 135–145)

## 2019-08-14 MED ORDER — CARBAMIDE PEROXIDE 6.5 % OT SOLN
5.0000 [drp] | Freq: Two times a day (BID) | OTIC | Status: DC
  Administered 2019-08-14 – 2019-08-16 (×5): 5 [drp] via OTIC
  Filled 2019-08-14: qty 15

## 2019-08-14 MED ORDER — GUAIFENESIN 100 MG/5ML PO SOLN
5.0000 mL | ORAL | Status: DC | PRN
  Administered 2019-08-14 – 2019-08-16 (×6): 100 mg via ORAL
  Filled 2019-08-14 (×6): qty 5

## 2019-08-14 MED ORDER — PERMETHRIN 5 % EX CREA
TOPICAL_CREAM | Freq: Once | CUTANEOUS | Status: AC
  Administered 2019-08-14: 1 via TOPICAL
  Filled 2019-08-14: qty 60

## 2019-08-14 MED ORDER — POLYETHYLENE GLYCOL 3350 17 G PO PACK
17.0000 g | PACK | Freq: Every day | ORAL | Status: DC
  Filled 2019-08-14 (×2): qty 1

## 2019-08-14 MED ORDER — KETOTIFEN FUMARATE 0.025 % OP SOLN
1.0000 [drp] | Freq: Two times a day (BID) | OPHTHALMIC | Status: AC
  Administered 2019-08-14 – 2019-08-16 (×5): 1 [drp] via OPHTHALMIC
  Filled 2019-08-14: qty 5

## 2019-08-14 MED ORDER — PERMETHRIN 1 % EX LOTN
TOPICAL_LOTION | Freq: Once | CUTANEOUS | Status: DC
  Filled 2019-08-14 (×2): qty 59

## 2019-08-14 MED ORDER — BISACODYL 5 MG PO TBEC
10.0000 mg | DELAYED_RELEASE_TABLET | Freq: Every day | ORAL | Status: DC
  Administered 2019-08-14 – 2019-08-16 (×3): 10 mg via ORAL
  Filled 2019-08-14 (×3): qty 2

## 2019-08-14 NOTE — Progress Notes (Signed)
Fluid restriction education reinforced multiple times. Patient continues to ask for fluids. Family at bedside has brought outside food and drinks and were asked to not leave them at the bedside as the drink would push the patient almost double her fluid restriction and that the fruit, although healthy, is also part of her fluid intake for the day. Patient continues to express that she understands however it is not apparent given family and nurse requests

## 2019-08-14 NOTE — Progress Notes (Signed)
PROGRESS NOTE    [name]  YQM:[id number] DOB: [birth year] DOA: 08/09/2019 PCP: Marcine Matar, MD    Brief Narrative: Kara Kindel Gerringeris a 54 y.o.femalewith medical history significant ofhypertension, hyperlipidemia, CHF, chronic respiratory failure, oxygen dependent on 3 L, asthma, cellulitis, chronic kidney disease, and chronic pain presented with complaints of shortness of breath and chest pain. Symptoms have been intermittent over several days. She describes the pain as pleuritic in nature. Notes associated symptoms of productive cough with yellow sputum, wheezing, rash, leg swelling, and right leg pain. She was concern for the possibility of having cellulitis of her right leg. At baseline patient gets around with use of a motorized scooter. Patient goes into more detail stating that she has had bedbugs and they have caused several wounds all over that appear to be red and infected.   ED Course:Upon admission into the emergency department patient was seen to be afebrile, respirations 18-23, O2 saturations as low as 86%on home 3 L of oxygen, and all other vital signs maintained. Labs significant for WBC 11.9, hemoglobin 11.2, potassium 6.2, BUN 29, creatinine 1.58, BNP 60.1, and high-sensitivity troponin negative x2. Chest x-ray concerning for cardiomegaly, vascular congestion, and possible early edema. Patient was given 80 mg of furosemide, 2 puffs of albuterol, 60 mg of prednisone, Mucinex, and oxycodone for pain. TRH called to admit  Assessment & Plan:   Active Problems:   Acute on chronic respiratory failure with hypoxia and hypercapnia (HCC)   CKD (chronic kidney disease) stage 3, GFR 30-59 ml/min (HCC)   OSA (obstructive sleep apnea)   Leukocytosis   Acute pulmonary edema (HCC)   HLD (hyperlipidemia)   Diastolic CHF, acute on chronic (HCC)   Hypoxemia   Hyperkalemia   BMI 60.0-69.9, adult (HCC)   Anxiety   Cellulitis   Bedbug bite with infection  Candidal skin infection    Acute hypoxic respiratory failure secondary to asthma exacerbation and acute on chronic diastolic CHF exacerbation   Acute on chronic diastolic heart failure patient admitted with weight gain, shortness of breath with dyspnea on exertion and lower extremity edema. She reports the symptoms have been going on for few weeks.  Weight down to 177.9 from 182 kg from 186. Echo done March 2021 shows ejection fraction 60 to 65%. Continue IV Lasix 40 twice daily.  Creatinine stable and improved at 1.8 down from 2.01.  Continue to do fluid restriction  Continue to hold lisinopril  Continue Imdur.   Chest x-ray on admission shows cardiomegaly with vascular congestion and early interstitial edema.  Asthma exacerbation-patient continues with diffuse wheezing.  Continue Solu-Medrol same dose without taper.  Continue Brovana and budesonide nebulizations.  VQ scan no evidence of PE  Candidal intertrigo/bedbug bites/cellulitis-continue Rocephin to finish a course of 5 days today.   Permethrin lotion x1 today  Nystatin powder  Interdry ordered  Diflucan for 3 days   morbid obesity BMI 68.15   history of essential hypertension-blood pressure 132/79continue Imdur and Lasix.    AKI CKD stage IIIb stablesecondary to diuresis. Creatinine is 1.8 from 2.01.   history of anxiety on Cymbalta and hydroxyzine  GERD on Protonix  hyperkalemiapotassium 5.0 status post Lokelma and  Calcium gluconate insulin and D50.    Estimated body mass index is 67.32 kg/m as calculated from the following:   Height as of this encounter: 5\' 4"  (1.626 m).   Weight as of this encounter: 177.9 kg.  DVT prophylaxis:Lovenox  code Status:Full code Family Communication:Discussed with patient Disposition  Plan:Status is: Inpatient  Dispo: The patient is from:Home Anticipated d/c is WU:JWJX Anticipated d/c date BJ:[id number] Patient  currentlyis not medically stable to d/c.Patient admitted with acute hypoxic respiratory failure secondary to CHF exacerbation and asthma on IV diuresis, still with significant edema needs to continue IV diuresis.    Consultants:  None  Procedures:None Antimicrobials:Rocephin  Subjective: Patient resting in bed complaining of wheezing shortness of breath continues to have itching all over the body  Objective: Vitals:   08/14/19 0535 08/14/19 0540 08/14/19 0750 08/14/19 0752  BP: 132/79     Pulse: 73     Resp: 19     Temp: 98.1 F (36.7 C)     TempSrc: Oral     SpO2: 93%  93% 93%  Weight:  (!) 177.9 kg    Height:        Intake/Output Summary (Last 24 hours) at 08/14/2019 1024 Last data filed at 08/14/2019 0900 Gross per 24 hour  Intake 1422 ml  Output 2400 ml  Net -978 ml   Filed Weights   08/12/19 0355 08/13/19 0425 08/14/19 0540  Weight: (!) 186 kg (!) 182.2 kg (!) 177.9 kg    Examination:  General exam: Appears calm and comfortable  Respiratory system: Diffuse wheezing to auscultation. Respiratory effort normal. Cardiovascular system: S1 & S2 heard, RRR. No JVD, murmurs, rubs, gallops or clicks. No pedal edema. Gastrointestinal system: Abdomen is nondistended, soft and nontender. No organomegaly or masses felt. Normal bowel sounds heard. Central nervous system: Alert and oriented. No focal neurological deficits. Extremities: Decreased erythema to the right lower extremity  Decrease swelling  skin: No rashes, lesions or ulcers Psychiatry: Judgement and insight appear normal. Mood & affect appropriate.     Data Reviewed: I have personally reviewed following labs and imaging studies  CBC: Recent Labs  Lab 08/09/19 2201 08/11/19 0438  WBC 11.9* 9.4  NEUTROABS  --  7.5  HGB 11.2* 10.3*  HCT 40.0 36.9  MCV 98.5 98.7  PLT 317 314   Basic Metabolic Panel: Recent Labs  Lab 08/09/19 2201 08/09/19 2201 08/10/19 1004 08/11/19 0438 08/12/19 0433  08/13/19 0612 08/14/19 0713  NA 143  --   --  141 138 138 138  K 6.2*   < > 5.6* 5.6* 6.6* 6.0* 5.0  CL 103  --   --  101 97* 93* 91*  CO2 31  --   --  32 34* 36* 35*  GLUCOSE 97  --   --  106* 108* 146* 151*  BUN 29*  --   --  33* 43* 48* 59*  CREATININE 1.58*  --   --  1.80* 2.01* 1.98* 1.87*  CALCIUM 8.7*  --   --  8.2* 8.0* 8.7* 8.9  MG  --   --   --  1.9  --   --   --    < > = values in this interval not displayed.   GFR: Estimated Creatinine Clearance: 55.8 mL/min (A) (by C-G formula based on SCr of 1.87 mg/dL (H)). Liver Function Tests: No results for input(s): AST, ALT, ALKPHOS, BILITOT, PROT, ALBUMIN in the last 168 hours. No results for input(s): LIPASE, AMYLASE in the last 168 hours. No results for input(s): AMMONIA in the last 168 hours. Coagulation Profile: No results for input(s): INR, PROTIME in the last 168 hours. Cardiac Enzymes: No results for input(s): CKTOTAL, CKMB, CKMBINDEX, TROPONINI in the last 168 hours. BNP (last 3 results) No results for input(s): PROBNP in the  last 8760 hours. HbA1C: No results for input(s): HGBA1C in the last 72 hours. CBG: No results for input(s): GLUCAP in the last 168 hours. Lipid Profile: No results for input(s): CHOL, HDL, LDLCALC, TRIG, CHOLHDL, LDLDIRECT in the last 72 hours. Thyroid Function Tests: No results for input(s): TSH, T4TOTAL, FREET4, T3FREE, THYROIDAB in the last 72 hours. Anemia Panel: No results for input(s): VITAMINB12, FOLATE, FERRITIN, TIBC, IRON, RETICCTPCT in the last 72 hours. Sepsis Labs: No results for input(s): PROCALCITON, LATICACIDVEN in the last 168 hours.  Recent Results (from the past 240 hour(s))  SARS CORONAVIRUS 2 (TAT 6-24 HRS) Nasopharyngeal Nasopharyngeal Swab     Status: None   Collection Time: 08/10/19  6:12 AM   Specimen: Nasopharyngeal Swab  Result Value Ref Range Status   SARS Coronavirus 2 NEGATIVE NEGATIVE Final    Comment: (NOTE) SARS-CoV-2 target nucleic acids are NOT  DETECTED. The SARS-CoV-2 RNA is generally detectable in upper and lower respiratory specimens during the acute phase of infection. Negative results do not preclude SARS-CoV-2 infection, do not rule out co-infections with other pathogens, and should not be used as the sole basis for treatment or other patient management decisions. Negative results must be combined with clinical observations, patient history, and epidemiological information. The expected result is Negative. Fact Sheet for Patients: HairSlick.no Fact Sheet for Healthcare Providers: quierodirigir.com This test is not yet approved or cleared by the [place] FDA and  has been authorized for detection and/or diagnosis of SARS-CoV-2 by FDA under an Emergency Use Authorization (EUA). This EUA will remain  in effect (meaning this test can be used) for the duration of the COVID-19 declaration under Section 56 4(b)(1) of the Act, 21 U.S.C. section 360bbb-3(b)(1), unless the authorization is terminated or revoked sooner. Performed at [facility], [address] [place], [place] [address]   MRSA PCR Screening     Status: Abnormal   Collection Time: 08/11/19  1:46 PM   Specimen: Nasopharyngeal  Result Value Ref Range Status   MRSA by PCR POSITIVE (A) NEGATIVE Final    Comment:        The GeneXpert MRSA Assay (FDA approved for NASAL specimens only), is one component of a comprehensive MRSA colonization surveillance program. It is not intended to diagnose MRSA infection nor to guide or monitor treatment for MRSA infections. RESULT CALLED TO, READ BACK BY AND VERIFIED WITH: Lovett Sox RN 16:05 08/11/19 ([email or web address]) Performed at [facility], [address], [place], [place] [address]          Radiology Studies: No results found.      Scheduled Meds: . allopurinol  100 mg Oral Daily  . arformoterol  15 mcg Nebulization BID  . bisacodyl  10 mg  Oral Daily  . budesonide  0.25 mg Nebulization BID  . carbamide peroxide  5 drop Both EARS BID  . Chlorhexidine Gluconate Cloth  6 each Topical Q0600  . cloNIDine  0.1 mg Oral Daily  . darifenacin  7.5 mg Oral Daily  . DULoxetine  30 mg Oral Daily  . enoxaparin (LOVENOX) injection  90 mg Subcutaneous Q24H  . furosemide  40 mg Intravenous BID  . isosorbide mononitrate  30 mg Oral Daily  . ketoconazole   Topical Once per day on Mon Thu  . ketotifen  1 drop Both Eyes BID  . methylPREDNISolone (SOLU-MEDROL) injection  60 mg Intravenous Q6H  . mupirocin ointment  1 application Nasal BID  . nystatin   Topical BID  .  pantoprazole  40 mg Oral BID AC  . permethrin   Topical Once  . polyethylene glycol  17 g Oral Daily  . sodium chloride flush  3 mL Intravenous Once  . sodium chloride flush  3 mL Intravenous Q12H   Continuous Infusions: . sodium chloride 250 mL (08/11/19 1952)  . cefTRIAXone (ROCEPHIN)  IV 2 g (08/13/19 2148)  . fluconazole (DIFLUCAN) IV 200 mg (08/13/19 1305)     LOS: 4 days    Alwyn Ren, MD 08/14/2019, 10:24 AM

## 2019-08-14 NOTE — Evaluation (Signed)
Occupational Therapy Evaluation Patient Details Name: Kara Vincent MRN: [id number] DOB: 11-14-1965 Today's Date: 08/14/2019    History of Present Illness Kara Vincent is a 54 y.o. female with medical history significant of hypertension, hyperlipidemia, CHF, chronic respiratory failure, oxygen dependent on 3 L, asthma, cellulitis, chronic kidney disease, and chronic pain presented with complaints of shortness of breath and chest pain.  Symptoms have been intermittent over several days.  She describes the pain as pleuritic in nature.  Notes associated symptoms of productive cough with yellow sputum, wheezing, rash, leg swelling, and right leg pain.  She was concern for the possibility of having cellulitis of her right leg.  At baseline patient gets around with use of a motorized scooter. Patient goes into more detail stating that she has had bedbugs and they have caused several wounds all over that appear to be red and infected.   Clinical Impression   PTA pt living in extended stay motel with assist of caregiver for both BADL/IADL. At time of eval, pt completing bed mobility at min A and mod A for sit <> stands. Pt is limited by pain and itchiness due to bed bugs and possible lice. Educated pt on LB BADL and appropriate hygiene. Pt has moisture and some skin break down in folds, pillow cases placed under breasts to reduce moisture and friction. Educated on use of long handled equipment to increase independence with self care. Given current status, recommend HHOT at d/c vs SNF pending progress. Will continue to follow per POC listed below.    Follow Up Recommendations  Home health OT;Other (comment)(vs SNF Pending progress)    Equipment Recommendations  3 in 1 bedside commode;Tub/shower seat    Recommendations for Other Services       Precautions / Restrictions Precautions Precautions: Fall Restrictions Weight Bearing Restrictions: No      Mobility Bed Mobility Overal bed  mobility: Needs Assistance Bed Mobility: Supine to Sit     Supine to sit: Min assist     General bed mobility comments: min handheld assist to pull to sit; cues to square off hips at EOB  Transfers Overall transfer level: Needs assistance Equipment used: 2 person hand held assist Transfers: Sit to/from Stand Sit to Stand: Mod assist         General transfer comment: Handheld assist to steady    Balance Overall balance assessment: Needs assistance   Sitting balance-Leahy Scale: Fair       Standing balance-Leahy Scale: Poor                             ADL either performed or assessed with clinical judgement   ADL Overall ADL's : Needs assistance/impaired Eating/Feeding: Set up;Sitting   Grooming: Set up;Sitting   Upper Body Bathing: Minimal assistance;Sitting   Lower Body Bathing: Moderate assistance;Sit to/from stand;Sitting/lateral leans   Upper Body Dressing : Minimal assistance;Sitting   Lower Body Dressing: Moderate assistance;Sitting/lateral leans;Sit to/from stand   Toilet Transfer: Minimal assistance;Moderate assistance;Stand-pivot;BSC   Toileting- Clothing Manipulation and Hygiene: Minimal assistance               Vision Patient Visual Report: No change from baseline       Perception     Praxis      Pertinent Vitals/Pain Pain Assessment: 0-10 Faces Pain Scale: Hurts worst Pain Location: RLE upper lateral thigh; itchy and painful back and LEs Pain Descriptors / Indicators: Aching;Sore Pain Intervention(s): Limited activity  within patient's tolerance;Monitored during session;Repositioned     Hand Dominance Right   Extremity/Trunk Assessment Upper Extremity Assessment Upper Extremity Assessment: Generalized weakness   Lower Extremity Assessment Lower Extremity Assessment: Generalized weakness       Communication Communication Communication: No difficulties   Cognition Arousal/Alertness: Awake/alert Behavior During  Therapy: WFL for tasks assessed/performed Overall Cognitive Status: Within Functional Limits for tasks assessed                                 General Comments: can be demanding of staff   General Comments  Session conducted on 5 L supplemental O2; her baseline is 3L supplemental O2    Exercises     Shoulder Instructions      Home Living Family/patient expects to be discharged to:: Other (Comment)(extended stay hotel) Living Arrangements: Other (Comment)(caregiver) Available Help at Discharge: Available 24 hours/day Type of Home: Other(Comment) Home Access: Level entry     Home Layout: One level     Bathroom Shower/Tub: Teacher, early years/pre: Standard     Home Equipment: Financial trader - single point;Shower seat          Prior Functioning/Environment Level of Independence: Needs assistance  Gait / Transfers Assistance Needed: pt primarily uses electric scooter or stays in bed ADL's / Homemaking Assistance Needed: caregiver assists with bathing, dressing, grocery shopping   Comments: Per chart review: pt reports "i was walking to the bathroom until my legs started hurting"        OT Problem List: Decreased strength;Decreased knowledge of use of DME or AE;Decreased activity tolerance;Cardiopulmonary status limiting activity;Impaired balance (sitting and/or standing);Pain      OT Treatment/Interventions: Self-care/ADL training;Therapeutic exercise;Patient/family education;Balance training;Energy conservation;Therapeutic activities;DME and/or AE instruction    OT Goals(Current goals can be found in the care plan section) Acute Rehab OT Goals Patient Stated Goal: Would like a specific cream for bed bugs OT Goal Formulation: With patient Time For Goal Achievement: 08/28/19 Potential to Achieve Goals: Good  OT Frequency: Min 2X/week   Barriers to D/C:            Co-evaluation              AM-PAC OT "6 Clicks" Daily  Activity     Outcome Measure Help from another person eating meals?: None Help from another person taking care of personal grooming?: None Help from another person toileting, which includes using toliet, bedpan, or urinal?: A Little Help from another person bathing (including washing, rinsing, drying)?: A Lot Help from another person to put on and taking off regular upper body clothing?: A Little Help from another person to put on and taking off regular lower body clothing?: A Lot 6 Click Score: 18   End of Session Nurse Communication: Mobility status  Activity Tolerance: Patient tolerated treatment well Patient left: in bed;with call bell/phone within reach  OT Visit Diagnosis: Unsteadiness on feet (R26.81);Other abnormalities of gait and mobility (R26.89);Muscle weakness (generalized) (M62.81);Pain Pain - part of body: (generalized)                Time: 5784-6962 OT Time Calculation (min): 21 min Charges:  OT General Charges $OT Visit: 1 Visit OT Evaluation $OT Eval Moderate Complexity: [place], MSOT, OTR/L [place] Office Number: [phone] Pager: [phone]  Zenovia Jarred 08/14/2019, 5:58 PM

## 2019-08-14 NOTE — Evaluation (Signed)
Physical Therapy Evaluation Patient Details Name: Kara Vincent MRN: [id number] DOB: [birth year] Today's Date: 08/14/2019   History of Present Illness  Kara Vincent is a 54 y.o. female with medical history significant of hypertension, hyperlipidemia, CHF, chronic respiratory failure, oxygen dependent on 3 L, asthma, cellulitis, chronic kidney disease, and chronic pain presented with complaints of shortness of breath and chest pain.  Symptoms have been intermittent over several days.  She describes the pain as pleuritic in nature.  Notes associated symptoms of productive cough with yellow sputum, wheezing, rash, leg swelling, and right leg pain.  She was concern for the possibility of having cellulitis of her right leg.  At baseline patient gets around with use of a motorized scooter. Patient goes into more detail stating that she has had bedbugs and they have caused several wounds all over that appear to be red and infected.  Clinical Impression   Pt admitted with above diagnosis. Comes from her home where she lives with her caregiver who can give her 24 hour or near 24 hour assist; They live in an extended stay motel, no steps to enter; At current baseline, pt uses her powerchair for mobility; per chart review, it looks like she was walking short distances prior to April of this year; Presents to PT with decr functional mobility, decr activity tolerance, Pain and itching all over, dependencies in mobility and ADLs;   Pt currently with functional limitations due to the deficits listed below (see PT Problem List). Pt will benefit from skilled PT to increase their independence and safety with mobility to allow discharge to the venue listed below.       Follow Up Recommendations Other (comment);Home health PT(If slow progress, consider SNF)    Equipment Recommendations  Hospital bed;3in1 (PT)    Recommendations for Other Services OT consult(ordered per protocol)     Precautions /  Restrictions Precautions Precautions: Fall      Mobility  Bed Mobility Overal bed mobility: Needs Assistance Bed Mobility: Supine to Sit     Supine to sit: Min assist     General bed mobility comments: min handheld assist to pull to sit; cues to square off hips at EOB  Transfers Overall transfer level: Needs assistance Equipment used: 2 person hand held assist Transfers: Sit to/from Stand Sit to Stand: Min assist;+2 safety/equipment         General transfer comment: Handheld assist to steady  Ambulation/Gait Ambulation/Gait assistance: Min assist;+2 physical assistance Gait Distance (Feet): (pivotal steps bed to chair) Assistive device: 2 person hand held assist       General Gait Details: Shuffle steps bed to chair  Stairs            Wheelchair Mobility    Modified Rankin (Stroke Patients Only)       Balance Overall balance assessment: Needs assistance   Sitting balance-Leahy Scale: Fair       Standing balance-Leahy Scale: Poor                               Pertinent Vitals/Pain Pain Assessment: Faces Faces Pain Scale: Hurts even more Pain Location: RLE upper lateral thigh; itchy and painful back and LEs Pain Descriptors / Indicators: Grimacing(Scratching, difficulty getting comfortable) Pain Intervention(s): Monitored during session(appled anti-itch cream to her back)    Home Living Family/patient expects to be discharged to:: Other (Comment)(Extended Stay Hotel) Living Arrangements: Other (Comment)(Caregiver) Available Help at Discharge: Available 24  hours/day Type of Home: Other(Comment) Home Access: Level entry     Home Layout: One level Home Equipment: Financial trader - single point      Prior Function Level of Independence: Needs assistance   Gait / Transfers Assistance Needed: pt primarily uses electric scooter or stays in bed  ADL's / Homemaking Assistance Needed: caregiver assists with bathing, dressing,  grocery shopping  Comments: Per chart review: pt reports "i was walking to the bathroom until my legs started hurting"     Hand Dominance   Dominant Hand: Right    Extremity/Trunk Assessment   Upper Extremity Assessment Upper Extremity Assessment: Generalized weakness    Lower Extremity Assessment Lower Extremity Assessment: Generalized weakness(Bil LE ROM limited by body habitus)       Communication   Communication: No difficulties  Cognition Arousal/Alertness: Awake/alert Behavior During Therapy: WFL for tasks assessed/performed Overall Cognitive Status: Within Functional Limits for tasks assessed                                        General Comments General comments (skin integrity, edema, etc.): Session conducted on 5 L supplemental O2; her baseline is 3L supplemental O2    Exercises     Assessment/Plan    PT Assessment Patient needs continued PT services  PT Problem List Decreased strength;Decreased range of motion;Decreased activity tolerance;Decreased balance;Decreased mobility;Decreased coordination;Decreased knowledge of use of DME;Decreased safety awareness;Decreased knowledge of precautions;Cardiopulmonary status limiting activity;Obesity;Decreased skin integrity;Pain       PT Treatment Interventions DME instruction;Gait training;Functional mobility training;Therapeutic activities;Therapeutic exercise    PT Goals (Current goals can be found in the Care Plan section)  Acute Rehab PT Goals Patient Stated Goal: Would like a specific cream for bed bugs PT Goal Formulation: With patient Time For Goal Achievement: 08/28/19 Potential to Achieve Goals: Good    Frequency Min 3X/week   Barriers to discharge   She reports her power chair needs repair    Co-evaluation               AM-PAC PT "6 Clicks" Mobility  Outcome Measure Help needed turning from your back to your side while in a flat bed without using bedrails?: A Little Help  needed moving from lying on your back to sitting on the side of a flat bed without using bedrails?: A Little Help needed moving to and from a bed to a chair (including a wheelchair)?: A Little Help needed standing up from a chair using your arms (e.g., wheelchair or bedside chair)?: A Little Help needed to walk in hospital room?: A Lot Help needed climbing 3-5 steps with a railing? : Total 6 Click Score: 15    End of Session Equipment Utilized During Treatment: Gait belt Activity Tolerance: Patient tolerated treatment well Patient left: in chair;with call bell/phone within reach Nurse Communication: Mobility status;Other (comment)(need for chair alarm pad) PT Visit Diagnosis: Unsteadiness on feet (R26.81);Other abnormalities of gait and mobility (R26.89);Muscle weakness (generalized) (M62.81)    Time: 3664-4034 PT Time Calculation (min) (ACUTE ONLY): 35 min   Charges:   PT Evaluation $PT Eval Moderate Complexity: 1 Mod PT Treatments $Therapeutic Activity: 8-22 mins $Neuromuscular Re-education: 8-22 mins        Roney Marion, PT  Acute Rehabilitation Services Pager [phone] Office [phone]   Colletta [place] 08/14/2019, 2:25 PM

## 2019-08-15 LAB — BASIC METABOLIC PANEL
Anion gap: 11 (ref 5–15)
BUN: 80 mg/dL — ABNORMAL HIGH (ref 6–20)
CO2: 35 mmol/L — ABNORMAL HIGH (ref 22–32)
Calcium: 8.6 mg/dL — ABNORMAL LOW (ref 8.9–10.3)
Chloride: 92 mmol/L — ABNORMAL LOW (ref 98–111)
Creatinine, Ser: 2.06 mg/dL — ABNORMAL HIGH (ref 0.44–1.00)
GFR calc Af Amer: 31 mL/min — ABNORMAL LOW (ref >60)
GFR calc non Af Amer: 26 mL/min — ABNORMAL LOW (ref >60)
Glucose, Bld: 144 mg/dL — ABNORMAL HIGH (ref 70–99)
Potassium: 4.6 mmol/L (ref 3.5–5.1)
Sodium: 138 mmol/L (ref 135–145)

## 2019-08-15 LAB — CBC
HCT: 40 % (ref 36.0–46.0)
Hemoglobin: 11.6 g/dL — ABNORMAL LOW (ref 12.0–15.0)
MCH: 27.9 pg (ref 26.0–34.0)
MCHC: 29 g/dL — ABNORMAL LOW (ref 30.0–36.0)
MCV: 96.2 fL (ref 80.0–100.0)
Platelets: 276 10*3/uL (ref 150–400)
RBC: 4.16 MIL/uL (ref 3.87–5.11)
RDW: 15.2 % (ref 11.5–15.5)
WBC: 9.1 10*3/uL (ref 4.0–10.5)
nRBC: 0 % (ref 0.0–0.2)

## 2019-08-15 MED ORDER — BISACODYL 5 MG PO TBEC
10.0000 mg | DELAYED_RELEASE_TABLET | Freq: Once | ORAL | Status: AC
  Administered 2019-08-15: 10 mg via ORAL
  Filled 2019-08-15: qty 2

## 2019-08-15 MED ORDER — METHYLPREDNISOLONE SODIUM SUCC 125 MG IJ SOLR
60.0000 mg | Freq: Every day | INTRAMUSCULAR | Status: DC
  Administered 2019-08-16: 60 mg via INTRAVENOUS
  Filled 2019-08-15: qty 2

## 2019-08-15 MED ORDER — LACTULOSE 10 GM/15ML PO SOLN
20.0000 g | Freq: Once | ORAL | Status: AC
  Administered 2019-08-15: 20 g via ORAL
  Filled 2019-08-15: qty 30

## 2019-08-15 MED ORDER — FUROSEMIDE 10 MG/ML IJ SOLN
40.0000 mg | Freq: Every day | INTRAMUSCULAR | Status: DC
  Administered 2019-08-16: 40 mg via INTRAVENOUS
  Filled 2019-08-15: qty 4

## 2019-08-15 MED ORDER — SORBITOL 70 % SOLN
960.0000 mL | TOPICAL_OIL | Freq: Once | ORAL | Status: DC
  Filled 2019-08-15: qty 473

## 2019-08-15 MED ORDER — BISACODYL 10 MG RE SUPP
10.0000 mg | Freq: Once | RECTAL | Status: AC
  Administered 2019-08-15: 10 mg via RECTAL
  Filled 2019-08-15: qty 1

## 2019-08-15 NOTE — TOC Progression Note (Signed)
Transition of Care [facility]) - Progression Note    Patient Details  Name: Kara Vincent MRN: [id number] Date of Birth: 04/17/1965  Transition of Care [facility]) CM/SW Contact  [name], RN Phone Number: 08/15/2019, 2:42 PM  Clinical Narrative:    NCM left message with Loma Sousa with [employer], to see where patient is on the list to get [employer] set up.  Also spoke with Thedore Mins with [employer] he states they have been trying to deliver the hospital bed but can not seem to get anyone on the phone, NCM informed him to contact patient at bedside, which he states he will do. She will use the 3 n 1 that she already has for her shower chair.  NCM notified MD to put order in for nebulizer machine.   Expected Discharge Plan: Home/Self Care Barriers to Discharge: Continued Medical Work up  Expected Discharge Plan and Services Expected Discharge Plan: Home/Self Care   Discharge Planning Services: CM Consult Post Acute Care Choice: Durable Medical Equipment Living arrangements for the past 2 months: Apartment                 DME Arranged: Hospital bed, 3-N-1 DME Agency: [employer] Date DME Agency Contacted: 08/12/19 Time DME Agency Contacted: [phone] Representative spoke with at DME Agency: Thedore Mins HH Arranged: NA           Social Determinants of Health ([place]) Interventions    Readmission Risk Interventions Readmission Risk Prevention Plan 08/12/2019  Transportation Screening Complete  PCP or Specialist Appt within 3-5 Days Complete  HRI or [place] Complete  Social Work Consult for [place] Planning/Counseling Complete  Palliative Care Screening Not Applicable  Medication Review Press photographer) Complete  Some recent data might be hidden

## 2019-08-15 NOTE — Progress Notes (Signed)
PROGRESS NOTE    LAURAMAE MCKINSEY  OZH:[id number] DOB: January 03, 1966 DOA: 08/09/2019 PCP: Marcine Matar, MD  Brief Narrative: Kara Barmore Gerringeris a 54 y.o.femalewith medical history significant ofhypertension, hyperlipidemia, CHF, chronic respiratory failure, oxygen dependent on 3 L, asthma, cellulitis, chronic kidney disease, and chronic pain presented with complaints of shortness of breath and chest pain. Symptoms have been intermittent over several days. She describes the pain as pleuritic in nature. Notes associated symptoms of productive cough with yellow sputum, wheezing, rash, leg swelling, and right leg pain. She was concern for the possibility of having cellulitis of her right leg. At baseline patient gets around with use of a motorized scooter. Patient goes into more detail stating that she has had bedbugs and they have caused several wounds all over that appear to be red and infected.   ED Course:Upon admission into the emergency department patient was seen to be afebrile, respirations 18-23, O2 saturations as low as 86%on home 3 L of oxygen, and all other vital signs maintained. Labs significant for WBC 11.9, hemoglobin 11.2, potassium 6.2, BUN 29, creatinine 1.58, BNP 60.1, and high-sensitivity troponin negative x2. Chest x-ray concerning for cardiomegaly, vascular congestion, and possible early edema. Patient was given 80 mg of furosemide, 2 puffs of albuterol, 60 mg of prednisone, Mucinex, and oxycodone for pain. TRH called to admit  Assessment & Plan:   Active Problems:   Acute on chronic respiratory failure with hypoxia and hypercapnia (HCC)   CKD (chronic kidney disease) stage 3, GFR 30-59 ml/min (HCC)   OSA (obstructive sleep apnea)   Leukocytosis   Acute pulmonary edema (HCC)   HLD (hyperlipidemia)   Diastolic CHF, acute on chronic (HCC)   Hypoxemia   Hyperkalemia   BMI 60.0-69.9, adult (HCC)   Anxiety   Cellulitis   Bedbug bite with infection   Candidal  skin infection Acute hypoxic respiratory failure secondary to asthma exacerbation and acute on chronic diastolic CHF exacerbation   Acute on chronic diastolic heart failure patient admitted with weight gain, shortness of breath with dyspnea on exertion and lower extremity edema. She reports the symptoms have been going on for few weeks. Weight 181.8 from  177.9 from 182 kg from 186. Echo done March 2021 shows ejection fraction 60 to 65%. Hold p.m. Lasix since creatinine is trending up from 1.8-2. Continue to hold lisinopril  Continue Imdur. Chest x-ray on admission shows cardiomegaly with vascular congestion and early interstitial edema.  Asthma exacerbation-patient continues with diffuse wheezing.  Continue Solu-Medrol Solu-Medrol taper down to 40 mg daily.  Continue Brovana and budesonide nebulizations.  VQ scan no evidence of PE  Candidal intertrigo/bedbug bites/cellulitis-she received Rocephin for 5 days. Permethrin lotion Nystatin powder  Interdry ordered  Diflucan for 3 days   morbid obesity BMI 68.15   history of essential hypertension-blood pressure 121/64.  Hold Imdur.  AKI CKD stage IIIb stablesecondary to diuresis. Creatinine is 2.01 from 1.8   history of anxiety on Cymbalta and hydroxyzine  GERD on Protonix  hyperkalemiapotassium 4.6 status post Lokelma and Calcium gluconate insulin and D50  Estimated body mass index is 68.8 kg/m as calculated from the following:   Height as of this encounter: 5\' 4"  (1.626 m).   Weight as of this encounter: 181.8 kg.  DVT prophylaxis:Lovenox  code Status:Full code Family Communication:Discussed with patient Disposition Plan:Status is: Inpatient  Dispo: The patient is from:Home Anticipated d/c is GE:[employer] Anticipated d/c date is: 08/15/2019 Patient currentlyis not medically stable to d/c.Patient admitted with acute hypoxic  respiratory failure secondary to  CHF exacerbation and asthma on IV diuresis, still with significant edema needs to continue IV diuresis.    Consultants:  None  Procedures:None Antimicrobials:Rocephin  Subjective: Patient is noncompliant to diet and fluids even in the hospital.  Family has been bringing her food and drink from outside.  Her weight has again gone up.  She continues to complain of wheezing.  Objective: Vitals:   08/15/19 0603 08/15/19 0807 08/15/19 0919 08/15/19 1119  BP:   130/70 121/64  Pulse:   83 83  Resp:   20 20  Temp:      TempSrc:      SpO2:  92% 93% 93%  Weight: (!) 181.8 kg     Height:        Intake/Output Summary (Last 24 hours) at 08/15/2019 1215 Last data filed at 08/15/2019 1130 Gross per 24 hour  Intake 1440 ml  Output 3000 ml  Net -1560 ml   Filed Weights   08/13/19 0425 08/14/19 0540 08/15/19 0603  Weight: (!) 182.2 kg (!) 177.9 kg (!) 181.8 kg    Examination:  General exam: Appears calm and comfortable  Respiratory system:  wheezing to auscultation. Respiratory effort normal. Cardiovascular system: S1 & S2 heard, RRR. No JVD, murmurs, rubs, gallops or clicks. No pedal edema. Gastrointestinal system: Abdomen is nondistended, soft and nontender. No organomegaly or masses felt. Normal bowel sounds heard. Central nervous system: Alert and oriented. No focal neurological deficits. Extremities: Symmetric 5 x 5 power.  No edema or erythema Skin: No rashes, lesions or ulcers Psychiatry: Judgement and insight appear normal. Mood & affect appropriate.     Data Reviewed: I have personally reviewed following labs and imaging studies  CBC: Recent Labs  Lab 08/09/19 2201 08/11/19 0438 08/15/19 0619  WBC 11.9* 9.4 9.1  NEUTROABS  --  7.5  --   HGB 11.2* 10.3* 11.6*  HCT 40.0 36.9 40.0  MCV 98.5 98.7 96.2  PLT 317 314 276   Basic Metabolic Panel: Recent Labs  Lab 08/11/19 0438 08/12/19 0433 08/13/19 0612 08/14/19 0713 08/15/19 0610  NA 141 138 138 138 138   K 5.6* 6.6* 6.0* 5.0 4.6  CL 101 97* 93* 91* 92*  CO2 32 34* 36* 35* 35*  GLUCOSE 106* 108* 146* 151* 144*  BUN 33* 43* 48* 59* 80*  CREATININE 1.80* 2.01* 1.98* 1.87* 2.06*  CALCIUM 8.2* 8.0* 8.7* 8.9 8.6*  MG 1.9  --   --   --   --    GFR: Estimated Creatinine Clearance: 51.4 mL/min (A) (by C-G formula based on SCr of 2.06 mg/dL (H)). Liver Function Tests: No results for input(s): AST, ALT, ALKPHOS, BILITOT, PROT, ALBUMIN in the last 168 hours. No results for input(s): LIPASE, AMYLASE in the last 168 hours. No results for input(s): AMMONIA in the last 168 hours. Coagulation Profile: No results for input(s): INR, PROTIME in the last 168 hours. Cardiac Enzymes: No results for input(s): CKTOTAL, CKMB, CKMBINDEX, TROPONINI in the last 168 hours. BNP (last 3 results) No results for input(s): PROBNP in the last 8760 hours. HbA1C: No results for input(s): HGBA1C in the last 72 hours. CBG: No results for input(s): GLUCAP in the last 168 hours. Lipid Profile: No results for input(s): CHOL, HDL, LDLCALC, TRIG, CHOLHDL, LDLDIRECT in the last 72 hours. Thyroid Function Tests: No results for input(s): TSH, T4TOTAL, FREET4, T3FREE, THYROIDAB in the last 72 hours. Anemia Panel: No results for input(s): VITAMINB12, FOLATE, FERRITIN, TIBC, IRON, RETICCTPCT in  the last 72 hours. Sepsis Labs: No results for input(s): PROCALCITON, LATICACIDVEN in the last 168 hours.  Recent Results (from the past 240 hour(s))  SARS CORONAVIRUS 2 (TAT 6-24 HRS) Nasopharyngeal Nasopharyngeal Swab     Status: None   Collection Time: 08/10/19  6:12 AM   Specimen: Nasopharyngeal Swab  Result Value Ref Range Status   SARS Coronavirus 2 NEGATIVE NEGATIVE Final    Comment: (NOTE) SARS-CoV-2 target nucleic acids are NOT DETECTED. The SARS-CoV-2 RNA is generally detectable in upper and lower respiratory specimens during the acute phase of infection. Negative results do not preclude SARS-CoV-2 infection, do not rule  out co-infections with other pathogens, and should not be used as the sole basis for treatment or other patient management decisions. Negative results must be combined with clinical observations, patient history, and epidemiological information. The expected result is Negative. Fact Sheet for Patients: HairSlick.no Fact Sheet for Healthcare Providers: quierodirigir.com This test is not yet approved or cleared by the [place] FDA and  has been authorized for detection and/or diagnosis of SARS-CoV-2 by FDA under an Emergency Use Authorization (EUA). This EUA will remain  in effect (meaning this test can be used) for the duration of the COVID-19 declaration under Section 56 4(b)(1) of the Act, 21 U.S.C. section 360bbb-3(b)(1), unless the authorization is terminated or revoked sooner. Performed at [facility], [address], [place], [place] [address]   MRSA PCR Screening     Status: Abnormal   Collection Time: 08/11/19  1:46 PM   Specimen: Nasopharyngeal  Result Value Ref Range Status   MRSA by PCR POSITIVE (A) NEGATIVE Final    Comment:        The GeneXpert MRSA Assay (FDA approved for NASAL specimens only), is one component of a comprehensive MRSA colonization surveillance program. It is not intended to diagnose MRSA infection nor to guide or monitor treatment for MRSA infections. RESULT CALLED TO, READ BACK BY AND VERIFIED WITH: Lovett Sox RN 16:05 08/11/19 ([email or web address]) Performed at [facility], [address], [place], [place] [address]          Radiology Studies: DG Chest 1 View  Result Date: 08/14/2019 CLINICAL DATA:  Hypoxemia. EXAM: CHEST  1 VIEW COMPARISON:  Aug 12, 2019 FINDINGS: Stable cardiomegaly. Pulmonary vascular congestion. No focal infiltrate. No pneumothorax. No interval changes. IMPRESSION: Cardiomegaly and pulmonary vascular congestion. No acute abnormalities otherwise seen.  Electronically Signed   By: Gerome Sam III M.D   On: 08/14/2019 18:27        Scheduled Meds: . allopurinol  100 mg Oral Daily  . arformoterol  15 mcg Nebulization BID  . bisacodyl  10 mg Oral Daily  . budesonide  0.25 mg Nebulization BID  . carbamide peroxide  5 drop Both EARS BID  . Chlorhexidine Gluconate Cloth  6 each Topical Q0600  . cloNIDine  0.1 mg Oral Daily  . darifenacin  7.5 mg Oral Daily  . DULoxetine  30 mg Oral Daily  . enoxaparin (LOVENOX) injection  90 mg Subcutaneous Q24H  . furosemide  40 mg Intravenous BID  . isosorbide mononitrate  30 mg Oral Daily  . ketoconazole   Topical Once per day on Mon Thu  . ketotifen  1 drop Both Eyes BID  . methylPREDNISolone (SOLU-MEDROL) injection  60 mg Intravenous Q6H  . mupirocin ointment  1 application Nasal BID  . nystatin   Topical BID  . pantoprazole  40 mg Oral BID AC  .  permethrin   Topical Once  . polyethylene glycol  17 g Oral Daily  . sodium chloride flush  3 mL Intravenous Once  . sodium chloride flush  3 mL Intravenous Q12H   Continuous Infusions: . sodium chloride 250 mL (08/11/19 1952)  . fluconazole (DIFLUCAN) IV 200 mg (08/15/19 1128)     LOS: 5 days     Alwyn Ren, MD  08/15/2019, 12:15 PM

## 2019-08-15 NOTE — Progress Notes (Signed)
No BM today, enema ordered, patient educated, offered enema several times. Patient refused ,MD notified. Patient stated she feels that she will go.

## 2019-08-15 NOTE — Plan of Care (Signed)
  Problem: Education: Goal: Knowledge of General Education information will improve Description: Including pain rating scale, medication(s)/side effects and non-pharmacologic comfort measures Outcome: Progressing   Problem: Health Behavior/Discharge Planning: Goal: Ability to manage health-related needs will improve Outcome: Progressing   Problem: Clinical Measurements: Goal: Ability to maintain clinical measurements within normal limits will improve Outcome: Progressing   Problem: Skin Integrity: Goal: Risk for impaired skin integrity will decrease Outcome: Progressing   

## 2019-08-15 NOTE — Progress Notes (Signed)
SATURATION QUALIFICATIONS: (This note is used to comply with regulatory documentation for home oxygen)  Patient Saturations on Room Air at Rest = 87%  Patient Saturations on 2 Liters of oxygen while Ambulating = 90%  Please briefly explain why patient needs home oxygen:

## 2019-08-15 NOTE — Progress Notes (Signed)
Pt offered cpap, pt does not want to wear at this time.

## 2019-08-15 NOTE — Progress Notes (Signed)
Physical Therapy Treatment Patient Details Name: Kara Vincent MRN: [id number] DOB: Jun 21, 1965 Today's Date: 08/15/2019    History of Present Illness Kara Vincent is a 54 y.o. female with medical history significant of hypertension, hyperlipidemia, CHF, chronic respiratory failure, oxygen dependent on 3 L, asthma, cellulitis, chronic kidney disease, and chronic pain presented with complaints of shortness of breath and chest pain.  Symptoms have been intermittent over several days.  She describes the pain as pleuritic in nature.  Notes associated symptoms of productive cough with yellow sputum, wheezing, rash, leg swelling, and right leg pain.  She was concern for the possibility of having cellulitis of her right leg.  At baseline patient gets around with use of a motorized scooter. Patient goes into more detail stating that she has had bedbugs and they have caused several wounds all over that appear to be red and infected.    PT Comments    Pt in bed upon arrival of PT, agreeable to PT session with focus on progressing mobility. Although initially agreeable, once pt seated EOB, she continued to decline further mobility (practicing transfers to chair or ambulation) despite attempts at education and persuasion. Therefore, the session was primarily focused on general exercises for strengthening LE and maintaining movement and education about the importance of continued mobility to maintain strength. The pt verbalized understanding, continued to decline any offers for continued mobility, and returned to supine in bed. PT will continue to follow to progress functional mobility and independence with transfers prior to d/c home.   Pt on 2L O2 initially, increased to 3L for sitting EOB and was able to maintain 90-94% sitting EOB on 3L .    Follow Up Recommendations  Other (comment);[facility] bed;3in1 (PT)    Recommendations for Other Services       Precautions / Restrictions Precautions Precautions: Fall Restrictions Weight Bearing Restrictions: No    Mobility  Bed Mobility Overal bed mobility: Needs Assistance Bed Mobility: Supine to Sit;Sit to Supine     Supine to sit: Supervision Sit to supine: Mod assist   General bed mobility comments: pt with heavy use of rails, able to come to sitting without assist, modA to return to bed due to pt inability to lift legs into bed  Transfers Overall transfer level: Needs assistance   Transfers: Lateral/Scoot Transfers          Lateral/Scoot Transfers: Supervision General transfer comment: pt able to scoot laterally in bed without assist, good bilateral clearance of hips from bed (2-4 inches). Pt declined further transfer and mobility despite repeated prompts, reporting fatigue  Ambulation/Gait Ambulation/Gait assistance: (pt declined)               Stairs             Wheelchair Mobility    Modified Rankin (Stroke Patients Only)       Balance Overall balance assessment: Needs assistance   Sitting balance-Leahy Scale: Fair                                      Cognition Arousal/Alertness: Awake/alert Behavior During Therapy: WFL for tasks assessed/performed Overall Cognitive Status: Within Functional Limits for tasks assessed  General Comments: pt is particular, stating she wants to work hard and get better, but also refusing progression of ambulation/mobility this afternoon. Poor insight to how lifestyle (nutrition, activity, etc) impact her current health status      Exercises General Exercises - Lower Extremity Long Arc Quad: AROM;Both;10 reps;Seated Hip Flexion/Marching: AROM;Both;10 reps;Seated Heel Raises: AROM;Both;10 reps;Seated Mini-Sqauts: AROM;5 reps;Both    General Comments General comments (skin integrity, edema, etc.): pt on 2L initially, able to sit EOB on 3L O2 with SpO2  90-95%      Pertinent Vitals/Pain Pain Assessment: Faces Faces Pain Scale: Hurts a little bit Pain Location: RLE, lower leg Pain Intervention(s): Limited activity within patient's tolerance;Monitored during session;Repositioned;Heat applied    Home Living                      Prior Function            PT Goals (current goals can now be found in the care plan section) Acute Rehab PT Goals Patient Stated Goal: Would like a specific cream for bed bugs PT Goal Formulation: With patient Time For Goal Achievement: 08/28/19 Potential to Achieve Goals: Good Progress towards PT goals: Progressing toward goals    Frequency    Min 3X/week      PT Plan Current plan remains appropriate    Co-evaluation              AM-PAC PT "6 Clicks" Mobility   Outcome Measure  Help needed turning from your back to your side while in a flat bed without using bedrails?: A Little Help needed moving from lying on your back to sitting on the side of a flat bed without using bedrails?: A Little Help needed moving to and from a bed to a chair (including a wheelchair)?: A Little Help needed standing up from a chair using your arms (e.g., wheelchair or bedside chair)?: A Little Help needed to walk in hospital room?: A Lot Help needed climbing 3-5 steps with a railing? : Total 6 Click Score: 15    End of Session Equipment Utilized During Treatment: Gait belt Activity Tolerance: Patient tolerated treatment well Patient left: in bed;with call bell/phone within reach;with bed alarm set Nurse Communication: Mobility status;Other (comment)(pt decline of further mobilty) PT Visit Diagnosis: Unsteadiness on feet (R26.81);Other abnormalities of gait and mobility (R26.89);Muscle weakness (generalized) (M62.81)     Time: 2725-3664 PT Time Calculation (min) (ACUTE ONLY): 34 min  Charges:  $Therapeutic Exercise: 8-22 mins $Therapeutic Activity: 8-22 mins                     Karma Ganja, PT,  DPT   Acute Rehabilitation Department Pager #: [phone]   Otho Bellows 08/15/2019, 4:44 PM

## 2019-08-15 NOTE — Progress Notes (Signed)
Pt placed on cpap at this time

## 2019-08-15 NOTE — Plan of Care (Signed)
  Problem: Education: Goal: Knowledge of General Education information will improve Description: Including pain rating scale, medication(s)/side effects and non-pharmacologic comfort measures Outcome: Progressing   Problem: Health Behavior/Discharge Planning: Goal: Ability to manage health-related needs will improve Outcome: Progressing   Problem: Clinical Measurements: Goal: Respiratory complications will improve Outcome: Progressing   Problem: Clinical Measurements: Goal: Cardiovascular complication will be avoided Outcome: Progressing   Problem: Skin Integrity: Goal: Risk for impaired skin integrity will decrease Outcome: Progressing

## 2019-08-16 ENCOUNTER — Other Ambulatory Visit: Payer: Self-pay | Admitting: Internal Medicine

## 2019-08-16 ENCOUNTER — Telehealth (HOSPITAL_COMMUNITY): Payer: Self-pay

## 2019-08-16 ENCOUNTER — Telehealth: Payer: Self-pay | Admitting: Internal Medicine

## 2019-08-16 DIAGNOSIS — I5033 Acute on chronic diastolic (congestive) heart failure: Secondary | ICD-10-CM

## 2019-08-16 LAB — BASIC METABOLIC PANEL
Anion gap: 11 (ref 5–15)
BUN: 82 mg/dL — ABNORMAL HIGH (ref 6–20)
CO2: 37 mmol/L — ABNORMAL HIGH (ref 22–32)
Calcium: 8.7 mg/dL — ABNORMAL LOW (ref 8.9–10.3)
Chloride: 92 mmol/L — ABNORMAL LOW (ref 98–111)
Creatinine, Ser: 1.86 mg/dL — ABNORMAL HIGH (ref 0.44–1.00)
GFR calc Af Amer: 35 mL/min — ABNORMAL LOW (ref >60)
GFR calc non Af Amer: 30 mL/min — ABNORMAL LOW (ref >60)
Glucose, Bld: 176 mg/dL — ABNORMAL HIGH (ref 70–99)
Potassium: 4 mmol/L (ref 3.5–5.1)
Sodium: 140 mmol/L (ref 135–145)

## 2019-08-16 MED ORDER — NYSTATIN 100000 UNIT/GM EX POWD: Freq: Two times a day (BID) | CUTANEOUS | 0 refills | Status: AC

## 2019-08-16 MED ORDER — ALPRAZOLAM 0.5 MG PO TABS: 0.5000 mg | ORAL_TABLET | Freq: Three times a day (TID) | ORAL | 0 refills | Status: AC | PRN

## 2019-08-16 MED ORDER — PREDNISONE 10 MG PO TABS: 10.0000 mg | ORAL_TABLET | Freq: Every day | ORAL | 0 refills | Status: AC

## 2019-08-16 MED ORDER — BISACODYL 5 MG PO TBEC: 10.0000 mg | DELAYED_RELEASE_TABLET | Freq: Every day | ORAL | 0 refills | Status: AC

## 2019-08-16 MED ORDER — MUPIROCIN 2 % EX OINT: 1.0000 "application " | TOPICAL_OINTMENT | Freq: Two times a day (BID) | CUTANEOUS | 0 refills | Status: AC

## 2019-08-16 MED ORDER — KETOCONAZOLE 2 % EX SHAM: MEDICATED_SHAMPOO | CUTANEOUS | 0 refills | Status: AC

## 2019-08-16 MED ORDER — ACETAMINOPHEN-CODEINE #3 300-30 MG PO TABS: 1.0000 | ORAL_TABLET | Freq: Three times a day (TID) | ORAL | 0 refills | Status: DC | PRN

## 2019-08-16 MED ORDER — KETOTIFEN FUMARATE 0.025 % OP SOLN: 1.0000 [drp] | Freq: Two times a day (BID) | OPHTHALMIC | 0 refills | Status: AC

## 2019-08-16 MED ORDER — CAMPHOR-MENTHOL 0.5-0.5 % EX LOTN: TOPICAL_LOTION | CUTANEOUS | 0 refills | Status: AC | PRN

## 2019-08-16 MED ORDER — BENZONATATE 100 MG PO CAPS
100.0000 mg | ORAL_CAPSULE | Freq: Four times a day (QID) | ORAL | 0 refills | Status: DC | PRN
Start: 2019-08-16 — End: 2019-09-08

## 2019-08-16 NOTE — Progress Notes (Signed)
Occupational Therapy Treatment Patient Details Name: Kara Vincent MRN: [id number] DOB: 1965/06/12 Today's Date: 08/16/2019    History of present illness Kara Vincent is a 54 y.o. female with medical history significant of hypertension, hyperlipidemia, CHF, chronic respiratory failure, oxygen dependent on 3 L, asthma, cellulitis, chronic kidney disease, and chronic pain presented with complaints of shortness of breath and chest pain.  Symptoms have been intermittent over several days.  She describes the pain as pleuritic in nature.  Notes associated symptoms of productive cough with yellow sputum, wheezing, rash, leg swelling, and right leg pain.  She was concern for the possibility of having cellulitis of her right leg.  At baseline patient gets around with use of a motorized scooter. Patient goes into more detail stating that she has had bedbugs and they have caused several wounds all over that appear to be red and infected.   OT comments  Pt progressing to EOB ADL with modified independence and squat pivot transfer to [facility] with minguardA. Pt sitting EOB x15 mins. Heavy use of rails for bed mobility. OT initiating E conservation techniques, but pt more focused on combing hair and then wanting to get to [facility]. OT to continue to follow.  O2 2L continuous >90%     Follow Up Recommendations  Home health OT;Supervision - Intermittent    Equipment Recommendations  3 in 1 bedside commode;Tub/shower seat    Recommendations for Other Services      Precautions / Restrictions Precautions Precautions: Fall;Other (comment) Precaution Comments: wears O2 continuously Restrictions Weight Bearing Restrictions: No       Mobility Bed Mobility Overal bed mobility: Needs Assistance Bed Mobility: Supine to Sit;Sit to Supine     Supine to sit: Supervision Sit to supine: Supervision   General bed mobility comments: use of rails; no physical assist required.  Transfers Overall transfer  level: Needs assistance   Transfers: Lateral/Scoot Transfers;Squat Pivot Transfers     Squat pivot transfers: Min guard    Lateral/Scoot Transfers: Supervision General transfer comment: Scooting laterally in bed to [facility] and squat pivot to BSC with minguardA.    Balance Overall balance assessment: Needs assistance   Sitting balance-Leahy Scale: Fair                                     ADL either performed or assessed with clinical judgement   ADL Overall ADL's : Needs assistance/impaired Eating/Feeding: Modified independent;Sitting   Grooming: Modified independent;Sitting   Upper Body Bathing: Minimal assistance;Sitting Upper Body Bathing Details (indicate cue type and reason): Pt requesting washing and lotion on back- anti-itch                         Functional mobility during ADLs: Min guard General ADL Comments: Pt performing grooming at EOB x15 mins with modified independence.     Vision   Vision Assessment?: No apparent visual deficits   Perception     Praxis      Cognition Arousal/Alertness: Awake/alert Behavior During Therapy: WFL for tasks assessed/performed Overall Cognitive Status: Within Functional Limits for tasks assessed                                 General Comments: Particular and demanding. Pt has poor insight into living a healthier lifestyle; often refusign OOB mobility.  Exercises     Shoulder Instructions       General Comments Pt's O2 on 2L >90% through exertion and breaks today.    Pertinent Vitals/ Pain       Pain Assessment: Faces Faces Pain Scale: Hurts a little bit Pain Location: where sores are present Pain Descriptors / Indicators: Aching;Sore Pain Intervention(s): Monitored during session  Home Living                                          Prior Functioning/Environment              Frequency  Min 2X/week        Progress Toward Goals  OT  Goals(current goals can now be found in the care plan section)  Progress towards OT goals: Progressing toward goals  Acute Rehab OT Goals Patient Stated Goal: to go home OT Goal Formulation: With patient Time For Goal Achievement: 08/28/19 Potential to Achieve Goals: Good ADL Goals Pt Will Perform Lower Body Bathing: with modified independence;sitting/lateral leans;sit to/from stand;with adaptive equipment Pt Will Perform Lower Body Dressing: with modified independence;sitting/lateral leans;sit to/from stand;with adaptive equipment Pt Will Transfer to Toilet: with modified independence;ambulating;regular height toilet;grab bars Pt Will Perform Tub/Shower Transfer: Tub transfer;with modified independence;ambulating;tub bench;rolling walker Additional ADL Goal #1: Pt will recall and apply 3-5 ECS strategies to BADL routine  Plan Discharge plan remains appropriate    Co-evaluation                 AM-PAC OT "6 Clicks" Daily Activity     Outcome Measure   Help from another person eating meals?: None Help from another person taking care of personal grooming?: None Help from another person toileting, which includes using toliet, bedpan, or urinal?: A Little Help from another person bathing (including washing, rinsing, drying)?: A Lot Help from another person to put on and taking off regular upper body clothing?: A Little Help from another person to put on and taking off regular lower body clothing?: A Lot 6 Click Score: 18    End of Session Equipment Utilized During Treatment: Oxygen  OT Visit Diagnosis: Unsteadiness on feet (R26.81);Other abnormalities of gait and mobility (R26.89);Muscle weakness (generalized) (M62.81);Pain Pain - part of body: (back and where sores are present)   Activity Tolerance Patient tolerated treatment well   Patient Left in bed;with call bell/phone within reach   Nurse Communication Mobility status        Time: 7253-6644 OT Time Calculation  (min): 28 min  Charges: OT General Charges $OT Visit: 1 Visit OT Treatments $Self Care/Home Management : 23-37 mins  Flora Lipps, OTR/L Acute Rehabilitation Services Pager: [phone] Office: [phone]    Ali Mohl C 08/16/2019, 10:20 AM

## 2019-08-16 NOTE — Progress Notes (Signed)
ANTICOAGULATION CONSULT NOTE - Follow Up Consult  Pharmacy Consult for Lovenox Indication: VTE prophylaxis  Allergies  Allergen Reactions  . Ibuprofen Hives, Shortness Of Breath, Palpitations and Rash    Has tolerated toradol 12/2013 inpatient  . Penicillins Anaphylaxis, Hives and Rash    Has patient had a PCN reaction causing immediate rash, facial/tongue/throat swelling, SOB or lightheadedness with hypotension: Yes Has patient had a PCN reaction causing severe rash involving mucus membranes or skin necrosis: Yes Has patient had a PCN reaction that required hospitalization Unknown Has patient had a PCN reaction occurring within the last 10 years: Unknown If all of the above answers are "NO", then may proceed with Cephalosporin use.   . Sulfa Antibiotics Hives, Shortness Of Breath, Rash and Cough  . Adhesive [Tape] Rash  . Doxycycline Hives, Swelling and Rash  . Sulfamethoxazole-Trimethoprim Rash  . Zithromax [Azithromycin] Hives, Swelling and Rash    Patient Measurements: Height: 5\' 4"  (162.6 cm) Weight: (!) 181.8 kg (400 lb 12.7 oz) IBW/kg (Calculated) : 54.7  Vital Signs: BP: 153/88 (05/25 0533) Pulse Rate: 65 (05/25 0533)  Labs: Recent Labs    08/14/19 0713 08/15/19 0610 08/15/19 0619  HGB  --   --  11.6*  HCT  --   --  40.0  PLT  --   --  276  CREATININE 1.87* 2.06*  --     Estimated Creatinine Clearance: 51.4 mL/min (A) (by C-G formula based on SCr of 2.06 mg/dL (H)).   Assessment:  Anticoag: VTE prophx. No PE seen on CT however severely limited d/t body habitus. VQ study negative. No anticoagulation PTA. Chronic anemia stable, plts wnl. Enox obese dosing. Hgb up to 11.6  Goal of Therapy:  Anti-Xa level 0.6-1 units/ml 4hrs after LMWH dose given Monitor platelets by anticoagulation protocol: Yes   Plan:  Enox 0.5mg /kg/d for ppx (BMI 68), f/u CBC q72h Labs not yet run today. Pharmacy will sign off. Please reconsult for further dosing assitance. Will  follow peripherally.   Moses Odoherty S. Alford Highland, PharmD, BCPS Clinical Staff Pharmacist [email or web address] Alford Highland, [employer] 08/16/2019,7:58 AM

## 2019-08-16 NOTE — Telephone Encounter (Signed)
Pharmacy called and requested to inform pcp of all the meds the patient has currently been prescribed from hospital. She stated that the patient is currently taking allopurinol (ZYLOPRIM) 100 MG tablet [[id number]]. She stated that the insurance is giving her a problem due to a possible reaction between allopurinol (ZYLOPRIM) 100 MG tablet [[id number]] & acetaminophen-codeine (TYLENOL #3) 300-30 MG tablet [[id number]] . She requested a call back regarding the manner.

## 2019-08-16 NOTE — TOC Transition Note (Addendum)
Transition of Care [facility]) - CM/SW Discharge Note   Patient Details  Name: Kara Vincent MRN: [id number] Date of Birth: Jul 09, 1965  Transition of Care [facility]) CM/SW Contact:  [name], RN Phone Number: 08/16/2019, 11:57 AM   Clinical Narrative:    Patient for dc today, she states she has a caregiver that helps her with physical therapy and exercises so she does not want HHPT.  [employer] tried to deliver the hospital bed yesterday, the room was full of other stuff that they could not get the bed in, patient will contact them when her son has cleared up the room for the hospital bed to be moved in.  Also Thedore Mins states [employer] has went out to the house to check to make sure concentrator is working , the green light is coming on and it is working properly now.  [employer] will bring an oxygen tank up to her room prior to dc.   1327- Per Zach with [employer] they brought the oxygen tank to patient's room and she refused to take the oxygen.  [facility], NCM informed Thedore Mins with [employer] to take oxygen tank back to patient at the [facility], he will take the tank.   Final next level of care: Home/Self Care Barriers to Discharge: No Barriers Identified   Patient Goals and CMS Choice Patient states their goals for this hospitalization and ongoing recovery are:: get better CMS Medicare.gov Compare Post Acute Care list provided to:: Patient Choice offered to / list presented to : Patient  Discharge Placement                       Discharge Plan and Services   Discharge Planning Services: CM Consult Post Acute Care Choice: Durable Medical Equipment          DME Arranged: Hospital bed, Oxygen DME Agency: [employer] Date DME Agency Contacted: 08/16/19 Time DME Agency Contacted: [id number] Representative spoke with at DME Agency: zach [place]: [place]          Social Determinants of Health ([place]) Interventions     Readmission Risk  Interventions Readmission Risk Prevention Plan 08/12/2019  Transportation Screening Complete  PCP or Specialist Appt within 3-5 Days Complete  HRI or [place] Complete  Social Work Consult for [place] Planning/Counseling Complete  Palliative Care Screening Not Applicable  Medication Review Press photographer) Complete  Some recent data might be hidden

## 2019-08-16 NOTE — Addendum Note (Signed)
Addended by: Karle Plumber B on: 08/16/2019 01:03 PM   Modules accepted: Orders

## 2019-08-16 NOTE — Telephone Encounter (Signed)
Will forward to pcp

## 2019-08-16 NOTE — Telephone Encounter (Signed)
lisinopril (ZESTRIL) 40 MG tablet [[id number]]  [place], Bertie  [address] [place], [place] [address]

## 2019-08-16 NOTE — Telephone Encounter (Signed)
Patient called in and requested for listed medication to be refilled and sent to [place], [place] - [address]  [address], [place] [address]  acetaminophen-codeine (TYLENOL #3) 300-30 MG tablet [[id number]]  Patient also requested for Tramadol

## 2019-08-16 NOTE — Discharge Summary (Signed)
Physician Discharge Summary  Kara Vincent YQM:[id number] DOB: 1966-03-16 DOA: 55/18/2021  PCP: Marcine Matar, MD  Admit date: 08/09/2019 Discharge date: 08/16/2019  Admitted From: Home Disposition: Home Recommendations for Outpatient Follow-up:  1. Follow up with PCP in 1-2 weeks 2. Please obtain BMP/CBC in one week 3. Please follow up on the following pending results:  Home Health: Physical therapy Equipment/Devices: Renewed oxygen prescription Discharge Condition: Stable and improved  CODE STATUS full code Diet recommendation: Cardiac diet Brief/Interim Summary:Kara G Gerringeris a 54 y.o.femalewith medical history significant ofhypertension, hyperlipidemia, CHF, chronic respiratory failure, oxygen dependent on 3 L, asthma, cellulitis, chronic kidney disease, and chronic pain presented with complaints of shortness of breath and chest pain. Symptoms have been intermittent over several days. She describes the pain as pleuritic in nature. Notes associated symptoms of productive cough with yellow sputum, wheezing, rash, leg swelling, and right leg pain. She was concern for the possibility of having cellulitis of her right leg. At baseline patient gets around with use of a motorized scooter. Patient goes into more detail stating that she has had bedbugs and they have caused several wounds all over that appear to be red and infected.   ED Course:Upon admission into the emergency department patient was seen to be afebrile, respirations 18-23, O2 saturations as low as 86%on home 3 L of oxygen, and all other vital signs maintained. Labs significant for WBC 11.9, hemoglobin 11.2, potassium 6.2, BUN 29, creatinine 1.58, BNP 60.1, and high-sensitivity troponin negative x2. Chest x-ray concerning for cardiomegaly, vascular congestion, and possible early edema. Patient was given 80 mg of furosemide, 2 puffs of albuterol, 60 mg of prednisone, Mucinex, and oxycodone for pain. [facility] called to  admit  Discharge Diagnoses:  Active Problems:   Acute on chronic respiratory failure with hypoxia and hypercapnia (HCC)   CKD (chronic kidney disease) stage 3, GFR 30-59 ml/min (HCC)   OSA (obstructive sleep apnea)   Leukocytosis   Acute pulmonary edema (HCC)   HLD (hyperlipidemia)   Diastolic CHF, acute on chronic (HCC)   Hypoxemia   Hyperkalemia   BMI 60.0-69.9, adult (HCC)   Anxiety   Cellulitis   Bedbug bite with infection   Candidal skin infection  Acute hypoxic respiratory failure secondary to asthma exacerbation and acute on chronic diastolic CHF exacerbation  Acute on chronic diastolic heart failure patient admitted with weight gain, shortness of breath with dyspnea on exertion and lower extremity edema. She was admitted to the telemetry floor was treated with IV Lasix and Imdur.  She was also placed on 1200 cc fluid restriction.  She was noncompliant with diet even in the hospital.  Her family is to bring her food and drink from outside.  Her weight did come down and she was negative by 3761 at the time of discharge.  Encourage her to be compliant with her medications and diet on discharge home.  Please note her lisinopril was held at the time of discharge since she was on twice a day of Lasix in the hospital and had a bump in her creatinine.  She needs to follow-up with her primary doctor and get BMP and likely restart lisinopril.   Asthma exacerbation-she was treated with IV Solu-Medrol will be discharged home on tapering prednisone with nebulizers.  And nebulizer machine was provided to her room prior to discharge.  She also requested a hospital bed, agency try to deliver her barrier to her home prior to discharge however there was no place to put a  bed in her house.  They will call back once the patient is discharged and try to deal with the bed again.  VQ scan no evidence of PE.  Candidal intertrigo/bedbug bites/cellulitis-she received Rocephin for 5 days.  She was also  treated with permethrin lotion nystatin powder and Diflucan for 3 days.  morbid obesity BMI 68.15  history of essential hypertension-continue home medications.    AKI CKD stage IIIb stable  history of anxiety on Cymbalta and hydroxyzine  GERD on Protonix  hyperkalemiapotassium 4.6 status post Lokelma and Calcium gluconate insulin and D50  Estimated body mass index is 68.8 kg/m as calculated from the following:   Height as of this encounter: 5\' 4"  (1.626 m).   Weight as of this encounter: 181.8 kg.  Discharge Instructions  Discharge Instructions    Diet - low sodium heart healthy   Complete by: As directed    For home use only DME Nebulizer machine   Complete by: As directed    Patient needs a nebulizer to treat with the following condition: Asthma exacerbation   Length of Need: Lifetime   Increase activity slowly   Complete by: As directed      Allergies as of 08/16/2019      Reactions   Ibuprofen Hives, Shortness Of Breath, Palpitations, Rash   Has tolerated toradol 12/2013 inpatient   Penicillins Anaphylaxis, Hives, Rash   Has patient had a PCN reaction causing immediate rash, facial/tongue/throat swelling, SOB or lightheadedness with hypotension: Yes Has patient had a PCN reaction causing severe rash involving mucus membranes or skin necrosis: Yes Has patient had a PCN reaction that required hospitalization Unknown Has patient had a PCN reaction occurring within the last 10 years: Unknown If all of the above answers are "NO", then may proceed with Cephalosporin use.   Sulfa Antibiotics Hives, Shortness Of Breath, Rash, Cough   Adhesive [tape] Rash   Doxycycline Hives, Swelling, Rash   Sulfamethoxazole-trimethoprim Rash   Zithromax [azithromycin] Hives, Swelling, Rash      Medication List    STOP taking these medications   acetaminophen-codeine 300-30 MG tablet Commonly known as: TYLENOL #3   carbamide peroxide 6.5 % OTIC solution Commonly known  as: DEBROX   lisinopril 40 MG tablet Commonly known as: ZESTRIL   permethrin 5 % cream Commonly known as: ELIMITE     TAKE these medications   albuterol 108 (90 Base) MCG/ACT inhaler Commonly known as: VENTOLIN HFA Inhale 1-2 puffs into the lungs every 6 (six) hours as needed for wheezing or shortness of breath.   allopurinol 100 MG tablet Commonly known as: ZYLOPRIM Take 1 tablet (100 mg total) by mouth every morning. What changed: when to take this   ALPRAZolam 0.5 MG tablet Commonly known as: Xanax Take 1 tablet (0.5 mg total) by mouth 3 (three) times daily as needed for sleep or anxiety.   arformoterol 15 MCG/2ML Nebu Commonly known as: BROVANA Take 2 mLs (15 mcg total) by nebulization 2 (two) times daily.   benzonatate 100 MG capsule Commonly known as: Tessalon Perles Take 1 capsule (100 mg total) by mouth every 6 (six) hours as needed for cough. What changed:   when to take this  reasons to take this   bisacodyl 5 MG EC tablet Commonly known as: DULCOLAX Take 2 tablets (10 mg total) by mouth daily.   budesonide 0.25 MG/2ML nebulizer solution Commonly known as: PULMICORT Take 2 mLs (0.25 mg total) by nebulization 2 (two) times daily.   camphor-menthol lotion  Commonly known as: SARNA Apply topically as needed for itching.   cholecalciferol 25 MCG (1000 UNIT) tablet Commonly known as: VITAMIN D3 Take 1,000 Units by mouth daily.   cloNIDine 0.2 MG tablet Commonly known as: CATAPRES TAKE ONE TABLET BY MOUTH TWICE DAILY   [employer] JOINT HEALTH TRIPLE ACTION PO Take 1 tablet by mouth daily.   darifenacin 7.5 MG 24 hr tablet Commonly known as: ENABLEX Take 1 tablet (7.5 mg total) by mouth daily.   DULoxetine 30 MG capsule Commonly known as: Cymbalta Take 1 capsule (30 mg total) by mouth daily.   Fish Oil 1000 MG Caps Take 1 capsule (1,000 mg total) by mouth daily.   furosemide 40 MG tablet Commonly known as: LASIX Take 1 tablet (40 mg total) by mouth  daily. 1 tab po daily   guaiFENesin-dextromethorphan 100-10 MG/5ML syrup Commonly known as: ROBITUSSIN DM Take 5 mLs by mouth every 4 (four) hours as needed for cough.   hydrOXYzine 50 MG tablet Commonly known as: ATARAX/VISTARIL TAKE ONE TABLET BY MOUTH THREE TIMES DAILY AS NEEDED FOR ANXIETY OR FOR ITCHING What changed: See the new instructions.   ipratropium-albuterol 0.5-2.5 (3) MG/3ML Soln Commonly known as: DUONEB Take 3 mLs by nebulization every 6 (six) hours as needed (SOB).   isosorbide mononitrate 30 MG 24 hr tablet Commonly known as: IMDUR Take 1 tablet (30 mg total) by mouth daily.   ketoconazole 2 % shampoo Commonly known as: NIZORAL Apply topically 2 (two) times a week. Start taking on: Aug 18, 2019   ketotifen 0.025 % ophthalmic solution Commonly known as: ZADITOR Place 1 drop into both eyes 2 (two) times daily.   Misc. Devices Misc Please provide patient with a raised toilet seat that is covered under her current insurance plan.   Misc. Devices Misc Heavy duty shower chair; Heavy duty potty chair. Dx: Osteoarthritis   mupirocin ointment 2 % Commonly known as: BACTROBAN Place 1 application into the nose 2 (two) times daily.   nystatin powder Commonly known as: MYCOSTATIN/NYSTOP Apply topically 2 (two) times daily. What changed:   when to take this  additional instructions   pantoprazole 40 MG tablet Commonly known as: PROTONIX Take 1 tablet (40 mg total) by mouth 2 (two) times daily before a meal.   polyethylene glycol 17 g packet Commonly known as: MIRALAX / GLYCOLAX Take 17 g by mouth daily as needed. What changed: reasons to take this   predniSONE 10 MG tablet Commonly known as: DELTASONE Take 1 tablet (10 mg total) by mouth daily. Take 3 tablets daily for the first 3 days then 2 tablets daily for the next 3 days and then 1 tablet daily till all the tablets are done.   senna-docusate 8.6-50 MG tablet Commonly known as: Senokot-S Take 1  tablet by mouth at bedtime as needed for mild constipation.   triamcinolone cream 0.1 % Commonly known as: KENALOG Apply 453 application topically 2 (two) times daily. To rash on back   vitamin B-12 500 MCG tablet Commonly known as: CYANOCOBALAMIN Take 500 mcg by mouth daily.   Vitamin E 100 units Tabs Take 1 tablet by mouth daily.            Durable Medical Equipment  (From admission, onward)         Start     Ordered   08/16/19 0943  DME Oxygen  Once    Question Answer Comment  Length of Need Lifetime   Mode or (Route) Nasal cannula  Liters per Minute 3   Frequency Continuous (stationary and portable oxygen unit needed)   Oxygen conserving device Yes   Oxygen delivery system Gas      08/16/19 0943   08/15/19 0000  For home use only DME Nebulizer machine    Question Answer Comment  Patient needs a nebulizer to treat with the following condition Asthma exacerbation   Length of Need Lifetime      08/15/19 1438   08/12/19 0949  For home use only DME Hospital bed  Once    Comments: HD  Question Answer Comment  Length of Need Lifetime   Patient has (list medical condition): CHF   The above medical condition requires: Patient requires the ability to reposition frequently   Head must be elevated greater than: 30 degrees   Bed type Semi-electric   Trapeze Bar Yes   Support Surface: Gel Overlay      08/12/19 0949   08/12/19 0923  For home use only DME 3 n 1  Once    Comments: Bariatric   08/12/19 9528         Follow-up Information    Marcine Matar, MD. Go on 08/23/2019.   Specialty: Internal Medicine Why: @4 :10pm Contact information: [address] [place] [address] [phone]          Allergies  Allergen Reactions  . Ibuprofen Hives, Shortness Of Breath, Palpitations and Rash    Has tolerated toradol 12/2013 inpatient  . Penicillins Anaphylaxis, Hives and Rash    Has patient had a PCN reaction causing immediate rash,  facial/tongue/throat swelling, SOB or lightheadedness with hypotension: Yes Has patient had a PCN reaction causing severe rash involving mucus membranes or skin necrosis: Yes Has patient had a PCN reaction that required hospitalization Unknown Has patient had a PCN reaction occurring within the last 10 years: Unknown If all of the above answers are "NO", then may proceed with Cephalosporin use.   . Sulfa Antibiotics Hives, Shortness Of Breath, Rash and Cough  . Adhesive [Tape] Rash  . Doxycycline Hives, Swelling and Rash  . Sulfamethoxazole-Trimethoprim Rash  . Zithromax [Azithromycin] Hives, Swelling and Rash    Consultations: none  Procedures/Studies: DG Chest 1 View  Result Date: 08/14/2019 CLINICAL DATA:  Hypoxemia. EXAM: CHEST  1 VIEW COMPARISON:  Aug 12, 2019 FINDINGS: Stable cardiomegaly. Pulmonary vascular congestion. No focal infiltrate. No pneumothorax. No interval changes. IMPRESSION: Cardiomegaly and pulmonary vascular congestion. No acute abnormalities otherwise seen. Electronically Signed   By: Gerome Sam III M.D   On: 08/14/2019 18:27   DG Chest 1 View  Result Date: 08/12/2019 CLINICAL DATA:  Hypoxia EXAM: CHEST  1 VIEW COMPARISON:  Aug 09, 2019 FINDINGS: There is cardiomegaly with pulmonary venous hypertension. No edema or airspace opacity. No adenopathy. No bone lesions. IMPRESSION: Cardiomegaly with pulmonary vascular congestion. No edema or airspace opacity. Electronically Signed   By: Bretta Bang III M.D.   On: 08/12/2019 10:18   DG Chest 2 View  Result Date: 08/09/2019 CLINICAL DATA:  Chest pain EXAM: CHEST - 2 VIEW COMPARISON:  06/25/2019 FINDINGS: Cardiomegaly with vascular congestion. Interstitial prominence may reflect early interstitial edema. No effusions. No acute bony abnormality. IMPRESSION: Cardiomegaly with vascular congestion and possible early interstitial edema. Electronically Signed   By: Charlett Nose M.D.   On: 08/09/2019 22:28   NM  Pulmonary Perfusion  Result Date: 08/10/2019 CLINICAL DATA:  Morbid obesity, positive D-dimer EXAM: NUCLEAR MEDICINE PERFUSION LUNG SCAN TECHNIQUE: Perfusion images were obtained in  multiple projections after intravenous injection of radiopharmaceutical. Ventilation scans intentionally deferred if perfusion scan and chest x-ray adequate for interpretation during COVID 19 epidemic. RADIOPHARMACEUTICALS:  1.65 mCi Tc-83m MAA IV COMPARISON:  None. FINDINGS: No focal perfusion defect.  Homogeneous perfusion. IMPRESSION: No scintigraphic evidence of pulmonary embolus. Electronically Signed   By: Elige Ko   On: 08/10/2019 15:34   VAS [place] LOWER EXTREMITY VENOUS (DVT)  Result Date: 08/10/2019  Lower Venous DVTStudy Indications: Pain.  Limitations: Body habitus and Patient position/ movement/ could not lay in supine position for long due to pain. Comparison Study: 06/14/19 Performing Technologist: Jeb Levering RDMS, RVT  Examination Guidelines: A complete evaluation includes B-mode imaging, spectral Doppler, color Doppler, and power Doppler as needed of all accessible portions of each vessel. Bilateral testing is considered an integral part of a complete examination. Limited examinations for reoccurring indications may be performed as noted. The reflux portion of the exam is performed with the patient in reverse Trendelenburg.  +---------+---------------+---------+-----------+----------+--------------+ RIGHT    CompressibilityPhasicitySpontaneityPropertiesThrombus Aging +---------+---------------+---------+-----------+----------+--------------+ CFV      Full           Yes      Yes                                 +---------+---------------+---------+-----------+----------+--------------+ SFJ      Full                                                        +---------+---------------+---------+-----------+----------+--------------+ FV Prox  Full                                                         +---------+---------------+---------+-----------+----------+--------------+ FV Mid   Full                                                        +---------+---------------+---------+-----------+----------+--------------+ FV DistalFull                                                        +---------+---------------+---------+-----------+----------+--------------+ PFV      Full                                                        +---------+---------------+---------+-----------+----------+--------------+ POP      Full           Yes      Yes                                 +---------+---------------+---------+-----------+----------+--------------+ PTV  Full                                                        +---------+---------------+---------+-----------+----------+--------------+ PERO     Full                                                        +---------+---------------+---------+-----------+----------+--------------+   Right Technical Findings: not all segments well visualized  +---------+---------------+---------+-----------+----------+--------------+ LEFT     CompressibilityPhasicitySpontaneityPropertiesThrombus Aging +---------+---------------+---------+-----------+----------+--------------+ CFV      Full           Yes      Yes                                 +---------+---------------+---------+-----------+----------+--------------+ SFJ      Full                                                        +---------+---------------+---------+-----------+----------+--------------+ FV Prox  Full                                                        +---------+---------------+---------+-----------+----------+--------------+ FV Mid   Full                                                        +---------+---------------+---------+-----------+----------+--------------+ FV DistalFull                                                         +---------+---------------+---------+-----------+----------+--------------+ PFV      Full                                                        +---------+---------------+---------+-----------+----------+--------------+ POP      Full           Yes      Yes                                 +---------+---------------+---------+-----------+----------+--------------+   Left Technical Findings: Test had to be stopped before final images taken due to patient's pain   Summary: RIGHT: - There is no evidence of deep vein thrombosis in the lower extremity. However, portions of this examination were  limited- see technologist comments above.  LEFT: - There is no evidence of deep vein thrombosis in the lower extremity. However, portions of this examination were limited- see technologist comments above.  *See table(s) above for measurements and observations. Electronically signed by Coral Else MD on 08/10/2019 at 4:54:04 PM.    Final     (Echo, Carotid, EGD, Colonoscopy, ERCP)    Subjective:  Patient laying down in bed and eating breakfast   discharge Exam: Vitals:   08/16/19 0533 08/16/19 0753  BP: (!) 153/88   Pulse: 65   Resp: 18   Temp:    SpO2: (!) 82% 94%   Vitals:   08/15/19 2053 08/15/19 2232 08/16/19 0533 08/16/19 0753  BP: (!) 141/85  (!) 153/88   Pulse: 79 80 65   Resp: 19 16 18    Temp:      TempSrc:      SpO2: 92% 94% (!) 82% 94%  Weight:      Height:        General: Pt is alert, awake, not in acute distress Cardiovascular: RRR, S1/S2 +, no rubs, no gallops Respiratory: CTA bilaterally, no wheezing, no rhonchi Abdominal: Soft, NT, ND, bowel sounds + Extremities: no edema, no cyanosis    The results of significant diagnostics from this hospitalization (including imaging, microbiology, ancillary and laboratory) are listed below for reference.     Microbiology: Recent Results (from the past 240 hour(s))  SARS CORONAVIRUS 2 (TAT 6-24 HRS)  Nasopharyngeal Nasopharyngeal Swab     Status: None   Collection Time: 08/10/19  6:12 AM   Specimen: Nasopharyngeal Swab  Result Value Ref Range Status   SARS Coronavirus 2 NEGATIVE NEGATIVE Final    Comment: (NOTE) SARS-CoV-2 target nucleic acids are NOT DETECTED. The SARS-CoV-2 RNA is generally detectable in upper and lower respiratory specimens during the acute phase of infection. Negative results do not preclude SARS-CoV-2 infection, do not rule out co-infections with other pathogens, and should not be used as the sole basis for treatment or other patient management decisions. Negative results must be combined with clinical observations, patient history, and epidemiological information. The expected result is Negative. Fact Sheet for Patients: HairSlick.no Fact Sheet for Healthcare Providers: quierodirigir.com This test is not yet approved or cleared by the [place] FDA and  has been authorized for detection and/or diagnosis of SARS-CoV-2 by FDA under an Emergency Use Authorization (EUA). This EUA will remain  in effect (meaning this test can be used) for the duration of the COVID-19 declaration under Section 56 4(b)(1) of the Act, 21 U.S.C. section 360bbb-3(b)(1), unless the authorization is terminated or revoked sooner. Performed at [facility], [address], [place], [place] [address]   MRSA PCR Screening     Status: Abnormal   Collection Time: 08/11/19  1:46 PM   Specimen: Nasopharyngeal  Result Value Ref Range Status   MRSA by PCR POSITIVE (A) NEGATIVE Final    Comment:        The GeneXpert MRSA Assay (FDA approved for NASAL specimens only), is one component of a comprehensive MRSA colonization surveillance program. It is not intended to diagnose MRSA infection nor to guide or monitor treatment for MRSA infections. RESULT CALLED TO, READ BACK BY AND VERIFIED WITH: Lovett Sox RN 16:05 08/11/19  ([email or web address]) Performed at [facility], [address] [place], [place] [address]      Labs: BNP (last 3 results) Recent Labs    03/12/19 0151 06/14/19 0981 08/10/19 1914  BNP 51.2 120.9* 60.1   Basic Metabolic Panel: Recent Labs  Lab 08/11/19 0438 08/12/19 0433 08/13/19 0612 08/14/19 0713 08/15/19 0610  NA 141 138 138 138 138  K 5.6* 6.6* 6.0* 5.0 4.6  CL 101 97* 93* 91* 92*  CO2 32 34* 36* 35* 35*  GLUCOSE 106* 108* 146* 151* 144*  BUN 33* 43* 48* 59* 80*  CREATININE 1.80* 2.01* 1.98* 1.87* 2.06*  CALCIUM 8.2* 8.0* 8.7* 8.9 8.6*  MG 1.9  --   --   --   --    Liver Function Tests: No results for input(s): AST, ALT, ALKPHOS, BILITOT, PROT, ALBUMIN in the last 168 hours. No results for input(s): LIPASE, AMYLASE in the last 168 hours. No results for input(s): AMMONIA in the last 168 hours. CBC: Recent Labs  Lab 08/09/19 2201 08/11/19 0438 08/15/19 0619  WBC 11.9* 9.4 9.1  NEUTROABS  --  7.5  --   HGB 11.2* 10.3* 11.6*  HCT 40.0 36.9 40.0  MCV 98.5 98.7 96.2  PLT 317 314 276   Cardiac Enzymes: No results for input(s): CKTOTAL, CKMB, CKMBINDEX, TROPONINI in the last 168 hours. BNP: Invalid input(s): POCBNP CBG: No results for input(s): GLUCAP in the last 168 hours. D-Dimer No results for input(s): DDIMER in the last 72 hours. Hgb A1c No results for input(s): HGBA1C in the last 72 hours. Lipid Profile No results for input(s): CHOL, HDL, LDLCALC, TRIG, CHOLHDL, LDLDIRECT in the last 72 hours. Thyroid function studies No results for input(s): TSH, T4TOTAL, T3FREE, THYROIDAB in the last 72 hours.  Invalid input(s): FREET3 Anemia work up No results for input(s): VITAMINB12, FOLATE, FERRITIN, TIBC, IRON, RETICCTPCT in the last 72 hours. Urinalysis    Component Value Date/Time   COLORURINE STRAW (A) 08/10/2019 0841   APPEARANCEUR CLEAR 08/10/2019 0841   LABSPEC 1.006 08/10/2019 0841   PHURINE 6.0 08/10/2019 0841   GLUCOSEU NEGATIVE 08/10/2019 0841    GLUCOSEU NEG mg/dL 16/12/9602 5409   HGBUR MODERATE (A) 08/10/2019 0841   HGBUR negative 12/05/2009 1003   BILIRUBINUR NEGATIVE 08/10/2019 0841   BILIRUBINUR moderate (A) 03/29/2018 1701   BILIRUBINUR small 07/31/2010 1713   KETONESUR NEGATIVE 08/10/2019 0841   PROTEINUR NEGATIVE 08/10/2019 0841   UROBILINOGEN 1.0 03/29/2018 1701   UROBILINOGEN 0.2 12/01/2014 2240   NITRITE NEGATIVE 08/10/2019 0841   LEUKOCYTESUR TRACE (A) 08/10/2019 0841   Sepsis Labs Invalid input(s): PROCALCITONIN,  WBC,  LACTICIDVEN Microbiology Recent Results (from the past 240 hour(s))  SARS CORONAVIRUS 2 (TAT 6-24 HRS) Nasopharyngeal Nasopharyngeal Swab     Status: None   Collection Time: 08/10/19  6:12 AM   Specimen: Nasopharyngeal Swab  Result Value Ref Range Status   SARS Coronavirus 2 NEGATIVE NEGATIVE Final    Comment: (NOTE) SARS-CoV-2 target nucleic acids are NOT DETECTED. The SARS-CoV-2 RNA is generally detectable in upper and lower respiratory specimens during the acute phase of infection. Negative results do not preclude SARS-CoV-2 infection, do not rule out co-infections with other pathogens, and should not be used as the sole basis for treatment or other patient management decisions. Negative results must be combined with clinical observations, patient history, and epidemiological information. The expected result is Negative. Fact Sheet for Patients: HairSlick.no Fact Sheet for Healthcare Providers: quierodirigir.com This test is not yet approved or cleared by the [place] FDA and  has been authorized for detection and/or diagnosis of SARS-CoV-2 by FDA under an Emergency Use Authorization (EUA). This EUA will remain  in effect (meaning this test can  be used) for the duration of the COVID-19 declaration under Section 56 4(b)(1) of the Act, 21 U.S.C. section 360bbb-3(b)(1), unless the authorization is terminated or revoked  sooner. Performed at [facility], [address], [place], [place] [address]   MRSA PCR Screening     Status: Abnormal   Collection Time: 08/11/19  1:46 PM   Specimen: Nasopharyngeal  Result Value Ref Range Status   MRSA by PCR POSITIVE (A) NEGATIVE Final    Comment:        The GeneXpert MRSA Assay (FDA approved for NASAL specimens only), is one component of a comprehensive MRSA colonization surveillance program. It is not intended to diagnose MRSA infection nor to guide or monitor treatment for MRSA infections. RESULT CALLED TO, READ BACK BY AND VERIFIED WITH: Lovett Sox RN 16:05 08/11/19 ([email or web address]) Performed at [facility], [address] [place], [place] [address]      Time coordinating discharge:  39 minutes  SIGNED:   Alwyn Ren, MD  [facility] 08/16/2019, 9:43 AM Pager   If 7PM-7AM, please contact night-coverage [email or web address] Password [email or web address]

## 2019-08-16 NOTE — Telephone Encounter (Signed)
Hi Deborah,  I saw ms Mcinroy post hosp and gave a one time Tylenol #3.  I will defer this request to you, thanks  Fraser Din

## 2019-08-16 NOTE — TOC Progression Note (Signed)
Transition of Care [facility]) - Progression Note    Patient Details  Name: Kara Vincent MRN: [id number] Date of Birth: 09/03/1965  Transition of Care [facility]) CM/SW Contact  [name], RN Phone Number: 08/16/2019, 8:50 AM  Clinical Narrative:    NCM spoke with Loma Sousa at [facility], she states she does not have an application for patient, she states patient may be with [facility] for [employer].  NCM contacted Medicaid Transport and she states she will have to renew the patient's application and that it will take 3 days or so.  Patient will need to ride the bus for transport home.   Expected Discharge Plan: Home/Self Care Barriers to Discharge: Continued Medical Work up  Expected Discharge Plan and Services Expected Discharge Plan: Home/Self Care   Discharge Planning Services: CM Consult Post Acute Care Choice: Durable Medical Equipment Living arrangements for the past 2 months: Apartment                 DME Arranged: Hospital bed, 3-N-1 DME Agency: [employer] Date DME Agency Contacted: 08/12/19 Time DME Agency Contacted: [phone] Representative spoke with at DME Agency: Thedore Mins HH Arranged: NA           Social Determinants of Health ([place]) Interventions    Readmission Risk Interventions Readmission Risk Prevention Plan 08/12/2019  Transportation Screening Complete  PCP or Specialist Appt within 3-5 Days Complete  HRI or [place] Complete  Social Work Consult for [place] Planning/Counseling Complete  Palliative Care Screening Not Applicable  Medication Review Press photographer) Complete  Some recent data might be hidden

## 2019-08-16 NOTE — Telephone Encounter (Signed)
Rf sent to Ty#3

## 2019-08-16 NOTE — Plan of Care (Signed)

## 2019-08-17 ENCOUNTER — Telehealth: Payer: Self-pay

## 2019-08-17 NOTE — Telephone Encounter (Signed)
Transition Care Management Follow-up Telephone Call Date of discharge and from where: 08/16/2019. [facility]   Call placed to patient # [phone], message left with call back requested to this CM.  Call placed to friend, Robin Searing # [phone], phone rings busy.   She has an appointment with Dr Wynetta Emery 08/23/2019

## 2019-08-17 NOTE — Telephone Encounter (Signed)
Phone call returned to [place].  I spoke with the pharmacist.  He states that they are having a hard time getting approval for Medicaid for refill on the Tylenol with Codeine because she is also on a benzodiazepine.  Looks like patient was prescribed Xanax on discharge from the hospital yesterday by the hospitalist.  I told the pharmacy that the Xanax was a recent addition and a limited prescription.  I do not have her on Xanax and do not plan to refill it once she is finished with what the hospitalist gave her.  If her Medicaid does not approve the Tylenol No. 3 because she is on Xanax then she may have to pay for the Tylenol No. 3 out-of-pocket for this month.  He states that they will work with her and try to get the price down.

## 2019-08-18 ENCOUNTER — Telehealth: Payer: Self-pay

## 2019-08-18 NOTE — Telephone Encounter (Signed)
Transition Care Management Follow-up Telephone Call Attempt # 2  Date of discharge and from where: 08/16/2019. [facility]   Call placed to patient # [phone], message left with call back requested to this CM.    She has an appointment with Dr Wynetta Emery 08/23/2019

## 2019-08-23 ENCOUNTER — Ambulatory Visit: Payer: Medicaid Other | Admitting: Internal Medicine

## 2019-09-02 ENCOUNTER — Telehealth: Payer: Self-pay

## 2019-09-02 NOTE — Telephone Encounter (Signed)
1) Medication(s) Requested (by name): TYLENOL# 3 & LISINOPRIL  2) Pharmacy of Choice: [facility]  3) Special Requests:   Approved medications will be sent to the pharmacy, we will reach out if there is an issue.  Requests made after 3pm may not be addressed until the following business day!  If a patient is unsure of the name of the medication(s) please note and ask patient to call back when they are able to provide all info, do not send to responsible party until all information is available!

## 2019-09-02 NOTE — Telephone Encounter (Signed)
Upon review of her chart, patient's lisinopril was held due to elevated creatinine in the hospital during recent admission. Recommendations from the hospital service were to obtain BMP and restart lisinopril.  Pt has an appointment on 09/16/19 but has not had labs since hospital dc. She was a no-show with Dr. Wynetta Emery on 08/23/2019. Will forward requests to provider covering for Dr. Wynetta Emery.

## 2019-09-02 NOTE — Telephone Encounter (Signed)
Hold Lisinopril per discharge summary until OV at which time need for Lisinopril will be determined after labs.

## 2019-09-05 ENCOUNTER — Other Ambulatory Visit: Payer: Self-pay | Admitting: Internal Medicine

## 2019-09-08 ENCOUNTER — Telehealth: Payer: Self-pay | Admitting: Internal Medicine

## 2019-09-08 MED ORDER — PANTOPRAZOLE SODIUM 40 MG PO TBEC: 40.0000 mg | DELAYED_RELEASE_TABLET | Freq: Two times a day (BID) | ORAL | 1 refills | Status: AC

## 2019-09-08 MED ORDER — BENZONATATE 100 MG PO CAPS: 100.0000 mg | ORAL_CAPSULE | Freq: Four times a day (QID) | ORAL | 0 refills | Status: DC | PRN

## 2019-09-08 NOTE — Telephone Encounter (Signed)
1) Medication(s) Requested (by name): pantoprazole (PROTONIX) 40 MG tablet Lisinopril  tessalon perles  2) Pharmacy of Choice: [facility], [place]  [place], [place] [address]   3) Special Requests: Patient states her pharmacy is closed on the weekends   Approved medications will be sent to the pharmacy, we will reach out if there is an issue.  Requests made after 3pm may not be addressed until the following business day!  If a patient is unsure of the name of the medication(s) please note and ask patient to call back when they are able to provide all info, do not send to responsible party until all information is available!\

## 2019-09-08 NOTE — Telephone Encounter (Signed)
We are holding lisinopril until the patient is seen. Other rxs sent.

## 2019-09-09 ENCOUNTER — Other Ambulatory Visit: Payer: Self-pay | Admitting: Internal Medicine

## 2019-09-13 ENCOUNTER — Telehealth: Payer: Self-pay

## 2019-09-13 NOTE — Telephone Encounter (Signed)
As per Amanda/[employer], they have not been able to reach the patient to discuss the order for POC

## 2019-09-15 ENCOUNTER — Ambulatory Visit: Payer: Medicaid Other | Admitting: Internal Medicine

## 2019-09-16 ENCOUNTER — Encounter: Payer: Self-pay | Admitting: Internal Medicine

## 2019-09-16 ENCOUNTER — Ambulatory Visit: Payer: Medicaid Other | Attending: Internal Medicine | Admitting: Internal Medicine

## 2019-09-16 ENCOUNTER — Other Ambulatory Visit: Payer: Self-pay

## 2019-09-16 VITALS — BP 100/68 | HR 84 | Temp 97.9°F | Resp 16 | Wt 385.8 lb

## 2019-09-16 DIAGNOSIS — I130 Hypertensive heart and chronic kidney disease with heart failure and stage 1 through stage 4 chronic kidney disease, or unspecified chronic kidney disease: Secondary | ICD-10-CM | POA: Insufficient documentation

## 2019-09-16 DIAGNOSIS — Z79899 Other long term (current) drug therapy: Secondary | ICD-10-CM | POA: Diagnosis not present

## 2019-09-16 DIAGNOSIS — N1831 Chronic kidney disease, stage 3a: Secondary | ICD-10-CM | POA: Diagnosis not present

## 2019-09-16 DIAGNOSIS — Z7951 Long term (current) use of inhaled steroids: Secondary | ICD-10-CM | POA: Diagnosis not present

## 2019-09-16 DIAGNOSIS — I350 Nonrheumatic aortic (valve) stenosis: Secondary | ICD-10-CM | POA: Diagnosis not present

## 2019-09-16 DIAGNOSIS — F419 Anxiety disorder, unspecified: Secondary | ICD-10-CM | POA: Diagnosis not present

## 2019-09-16 DIAGNOSIS — Z8249 Family history of ischemic heart disease and other diseases of the circulatory system: Secondary | ICD-10-CM | POA: Diagnosis not present

## 2019-09-16 DIAGNOSIS — Z823 Family history of stroke: Secondary | ICD-10-CM | POA: Insufficient documentation

## 2019-09-16 DIAGNOSIS — M109 Gout, unspecified: Secondary | ICD-10-CM | POA: Insufficient documentation

## 2019-09-16 DIAGNOSIS — E785 Hyperlipidemia, unspecified: Secondary | ICD-10-CM | POA: Diagnosis not present

## 2019-09-16 DIAGNOSIS — Z825 Family history of asthma and other chronic lower respiratory diseases: Secondary | ICD-10-CM | POA: Insufficient documentation

## 2019-09-16 DIAGNOSIS — G4733 Obstructive sleep apnea (adult) (pediatric): Secondary | ICD-10-CM | POA: Diagnosis not present

## 2019-09-16 DIAGNOSIS — Z87891 Personal history of nicotine dependence: Secondary | ICD-10-CM | POA: Diagnosis not present

## 2019-09-16 DIAGNOSIS — J9611 Chronic respiratory failure with hypoxia: Secondary | ICD-10-CM

## 2019-09-16 DIAGNOSIS — Z8261 Family history of arthritis: Secondary | ICD-10-CM | POA: Insufficient documentation

## 2019-09-16 DIAGNOSIS — I5032 Chronic diastolic (congestive) heart failure: Secondary | ICD-10-CM | POA: Diagnosis not present

## 2019-09-16 DIAGNOSIS — Z6841 Body Mass Index (BMI) 40.0 and over, adult: Secondary | ICD-10-CM | POA: Diagnosis not present

## 2019-09-16 DIAGNOSIS — J4540 Moderate persistent asthma, uncomplicated: Secondary | ICD-10-CM | POA: Diagnosis not present

## 2019-09-16 DIAGNOSIS — Z09 Encounter for follow-up examination after completed treatment for conditions other than malignant neoplasm: Secondary | ICD-10-CM | POA: Diagnosis not present

## 2019-09-16 DIAGNOSIS — Z833 Family history of diabetes mellitus: Secondary | ICD-10-CM | POA: Diagnosis not present

## 2019-09-16 DIAGNOSIS — K219 Gastro-esophageal reflux disease without esophagitis: Secondary | ICD-10-CM | POA: Insufficient documentation

## 2019-09-16 DIAGNOSIS — G894 Chronic pain syndrome: Secondary | ICD-10-CM | POA: Insufficient documentation

## 2019-09-16 DIAGNOSIS — L304 Erythema intertrigo: Secondary | ICD-10-CM

## 2019-09-16 DIAGNOSIS — Z881 Allergy status to other antibiotic agents status: Secondary | ICD-10-CM | POA: Insufficient documentation

## 2019-09-16 DIAGNOSIS — Z882 Allergy status to sulfonamides status: Secondary | ICD-10-CM | POA: Diagnosis not present

## 2019-09-16 DIAGNOSIS — J45909 Unspecified asthma, uncomplicated: Secondary | ICD-10-CM | POA: Diagnosis present

## 2019-09-16 DIAGNOSIS — Z886 Allergy status to analgesic agent status: Secondary | ICD-10-CM | POA: Insufficient documentation

## 2019-09-16 DIAGNOSIS — E662 Morbid (severe) obesity with alveolar hypoventilation: Secondary | ICD-10-CM | POA: Insufficient documentation

## 2019-09-16 DIAGNOSIS — M170 Bilateral primary osteoarthritis of knee: Secondary | ICD-10-CM | POA: Diagnosis not present

## 2019-09-16 DIAGNOSIS — Z880 Allergy status to penicillin: Secondary | ICD-10-CM | POA: Insufficient documentation

## 2019-09-16 MED ORDER — CLINDAMYCIN HCL 300 MG PO CAPS: 300.0000 mg | ORAL_CAPSULE | Freq: Three times a day (TID) | ORAL | 0 refills | Status: AC

## 2019-09-16 NOTE — Patient Instructions (Signed)
[facility] for [facility] to get portable oxygen.

## 2019-09-16 NOTE — Progress Notes (Signed)
Pt states she is still having pain in b/l legs, head, under right arm pit and left side   Pt states she think she needs to go back to the hospital

## 2019-09-16 NOTE — Progress Notes (Signed)
Patient ID: Kara Vincent, female    DOB: [birth year]  MRN: [id number]  CC: Hospitalization Follow-up   Subjective: Kara Vincent is a 54 y.o. 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  She had presented with complaints of shortness of breath, lower extremity edema, productive cough and chest pains.  In the emergency room she was found to be hypoxic with oxygen level of 86% on 3 L of O2.  BNP was 60.  Chest x-ray revealed cardiomegaly and vascular congestion with possible early edema.  VQ scan was negative for PE.  Patient was diuresed almost 4 L.  Weight on discharge was around 400 pounds.  Lisinopril was held due to bump in creatinine from diuresis. For her asthma she was treated with IV steroids and discharged home on tapering prednisone with nebulizers and a nebulizer machine. She was found to have intertrigo and bedbug bites with cellulitis of the leg.  She was treated appropriately. She was prescribed some Xanax for anxiety.  Today: She thinks she is having fluid accumulation again and may need to be back in the hospital.  She caught the bus to this appointment and did not have her oxygen with her.  Pulse ox was 87% on room air when she arrived.  Requesting prescription for portable O2 as the tank that she has at home is too large to carry on the city bus. -Endorses lower extremity edema.  Endorses shortness of breath when moving around.  No PND orthopnea.  Her weight is down 15 pounds since hospitalization. -Reports having been given CPAP machine upon discharge from the  hospital in March.  She tells me that she is using it consistently but recently the machine has started making weird noises.  She plans to call the company for them to check it out.  In regards to her asthma, she tells me she uses her nebulizer about every 6 hours.  I told her it was written to be used twice a day.  Complains of an itchy scaly rash under her right arm. She tells me that she had sprayed her place for bedbugs and that is no longer an issue.  Complains of having a few small boils in the upper inner thigh and requesting antibiotics for that.  She is wanting me to give her a refill on Xanax which she was prescribed from the hospital. Patient Active Problem List   Diagnosis Date Noted  . Bedbug bite with infection 08/10/2019  . Candidal skin infection 08/10/2019  . Chronic obstructive pulmonary disease with acute lower respiratory infection ([place]) 07/20/2019  . Cellulitis of both lower extremities 06/14/2019  . Cellulitis 06/14/2019  . BMI 60.0-69.9, adult ([place]) 12/01/2017  . Primary osteoarthritis of both knees 12/01/2017  . Anxiety 12/01/2017  . Impaired mobility 12/01/2017  . Hyperkalemia 07/10/2017  . Hypoxemia 01/22/2017  . Acute pulmonary edema ([place]) 12/14/2015  . Essential hypertension 12/14/2015  . HLD (hyperlipidemia) 12/14/2015  . GERD (gastroesophageal reflux disease) 12/14/2015  . Gout 12/14/2015  . Diastolic CHF, acute on chronic (HCC) 12/14/2015  . Darier's disease   . Leukocytosis   . Right-sided heart failure ([place]) 11/20/2014  . OSA (obstructive  sleep apnea)   . CKD (chronic kidney disease) stage 3, GFR 30-59 ml/min (HCC) 11/17/2014  . Venous stasis 11/16/2014  . Acute on chronic respiratory failure with hypoxia and hypercapnia ([place]) 11/16/2014  . Chronic pain 08/24/2011  . Obesity hypoventilation syndrome ([place]) 08/24/2011     Current Outpatient Medications on File Prior to Visit  Medication Sig Dispense Refill  . albuterol (VENTOLIN HFA) 108 (90  Base) MCG/ACT inhaler Inhale 1-2 puffs into the lungs every 6 (six) hours as needed for wheezing or shortness of breath. 8 g 6  . allopurinol (ZYLOPRIM) 100 MG tablet TAKE ONE TABLET BY MOUTH EVERY MORNING 30 tablet 6  . ALPRAZolam (XANAX) 0.5 MG tablet Take 1 tablet (0.5 mg total) by mouth 3 (three) times daily as needed for sleep or anxiety. 30 tablet 0  . arformoterol (BROVANA) 15 MCG/2ML NEBU Take 2 mLs (15 mcg total) by nebulization 2 (two) times daily. 120 mL 1  . benzonatate (TESSALON PERLES) 100 MG capsule Take 1 capsule (100 mg total) by mouth every 6 (six) hours as needed for cough. 30 capsule 0  . bisacodyl (DULCOLAX) 5 MG EC tablet Take 2 tablets (10 mg total) by mouth daily. 30 tablet 0  . budesonide (PULMICORT) 0.25 MG/2ML nebulizer solution Take 2 mLs (0.25 mg total) by nebulization 2 (two) times daily. 60 mL 12  . camphor-menthol (SARNA) lotion Apply topically as needed for itching. 222 mL 0  . cholecalciferol (VITAMIN D3) 25 MCG (1000 UNIT) tablet Take 1,000 Units by mouth daily.    . cloNIDine (CATAPRES) 0.2 MG tablet TAKE ONE TABLET BY MOUTH TWICE DAILY (Patient taking differently: Take 0.2 mg by mouth 2 (two) times daily. ) 60 tablet 5  . Collagen-Boron-Hyaluronic Acid ([employer] JOINT HEALTH TRIPLE ACTION PO) Take 1 tablet by mouth daily.    [name] darifenacin (ENABLEX) 7.5 MG 24 hr tablet Take 1 tablet (7.5 mg total) by mouth daily. 30 tablet 2  . DULoxetine (CYMBALTA) 30 MG capsule Take 1 capsule (30 mg total) by mouth daily. 30 capsule 5  . furosemide (LASIX) 40 MG tablet Take 1 tablet (40 mg total) by mouth daily. 1 tab po daily 60 tablet 3  . guaiFENesin-dextromethorphan (ROBITUSSIN DM) 100-10 MG/5ML syrup Take 5 mLs by mouth every 4 (four) hours as needed for cough. 118 mL 0  . hydrOXYzine (ATARAX/VISTARIL) 50 MG tablet TAKE ONE TABLET BY MOUTH THREE TIMES DAILY AS NEEDED FOR ANXIETY OR FOR ITCHING (Patient taking differently: Take 50 mg by mouth every 8 (eight) hours as needed for  anxiety. ) 60 tablet 1  . ipratropium-albuterol (DUONEB) 0.5-2.5 (3) MG/3ML SOLN Take 3 mLs by nebulization every 6 (six) hours as needed (SOB). 3 mL 6  . isosorbide mononitrate (IMDUR) 30 MG 24 hr tablet Take 1 tablet (30 mg total) by mouth daily. 30 tablet 6  . ketoconazole (NIZORAL) 2 % shampoo Apply topically 2 (two) times a week. 120 mL 0  . ketotifen (ZADITOR) 0.025 % ophthalmic solution Place 1 drop into both eyes 2 (two) times daily. 5 mL 0  . Misc. Devices MISC Please provide patient with a raised toilet seat that is covered under her current insurance plan. 1 each 0  . Misc. Devices MISC Heavy duty shower chair; Heavy duty potty chair. Dx: Osteoarthritis 1 each 0  . mupirocin ointment (BACTROBAN) 2 % Place 1 application into the nose 2 (two) times daily. 22 g 0  . nystatin (MYCOSTATIN/NYSTOP) powder Apply topically 2 (two) times daily. 15 g  0  . Omega-3 Fatty Acids (FISH OIL) 1000 MG CAPS Take 1 capsule (1,000 mg total) by mouth daily. 90 capsule 0  . pantoprazole (PROTONIX) 40 MG tablet Take 1 tablet (40 mg total) by mouth 2 (two) times daily before a meal. 60 tablet 1  . polyethylene glycol (MIRALAX / GLYCOLAX) 17 g packet Take 17 g by mouth daily as needed. (Patient taking differently: Take 17 g by mouth daily as needed for mild constipation. ) 14 each 0  . predniSONE (DELTASONE) 10 MG tablet Take 1 tablet (10 mg total) by mouth daily. Take 3 tablets daily for the first 3 days then 2 tablets daily for the next 3 days and then 1 tablet daily till all the tablets are done. 30 tablet 0  . senna-docusate (SENOKOT-S) 8.6-50 MG tablet Take 1 tablet by mouth at bedtime as needed for mild constipation. 30 tablet 1  . triamcinolone cream (KENALOG) 0.1 % Apply 081 application topically 2 (two) times daily. To rash on back 453 g 1  . vitamin B-12 (CYANOCOBALAMIN) 500 MCG tablet Take 500 mcg by mouth daily.    . Vitamin E 100 units TABS Take 1 tablet by mouth daily. 30 tablet 2  . [DISCONTINUED]  diphenhydrAMINE (SOMINEX) 25 MG tablet Take 25 mg by mouth at bedtime as needed.     No current facility-administered medications on file prior to visit.    Allergies  Allergen Reactions  . Ibuprofen Hives, Shortness Of Breath, Palpitations and Rash    Has tolerated toradol 12/2013 inpatient  . Penicillins Anaphylaxis, Hives and Rash    Has patient had a PCN reaction causing immediate rash, facial/tongue/throat swelling, SOB or lightheadedness with hypotension: Yes Has patient had a PCN reaction causing severe rash involving mucus membranes or skin necrosis: Yes Has patient had a PCN reaction that required hospitalization Unknown Has patient had a PCN reaction occurring within the last 10 years: Unknown If all of the above answers are "NO", then may proceed with Cephalosporin use.   . Sulfa Antibiotics Hives, Shortness Of Breath, Rash and Cough  . Adhesive [Tape] Rash  . Doxycycline Hives, Swelling and Rash  . Sulfamethoxazole-Trimethoprim Rash  . Zithromax [Azithromycin] Hives, Swelling and Rash    Social History   Socioeconomic History  . Marital status: Single    Spouse name: Not on file  . Number of children: Not on file  . Years of education: Not on file  . Highest education level: Not on file  Occupational History  . Not on file  Tobacco Use  . Smoking status: Former Smoker    Types: Cigarettes    Quit date: 03/25/2003    Years since quitting: 16.4  . Smokeless tobacco: Former Systems developer    Quit date: 05/27/1978  Vaping Use  . Vaping Use: Never used  Substance and Sexual Activity  . Alcohol use: Yes    Alcohol/week: 0.0 standard drinks    Comment: years ago  no longer beer only  early 20's  . Drug use: Yes    Types: "Crack" cocaine, Other-see comments    Comment: years  ago  . Sexual activity: Yes    Birth control/protection: Other-see comments, None, Abstinence  Other Topics Concern  . Not on file  Social History Narrative   ** Merged History Encounter **        Patient has exhausted [facility] financial resources. She does have access to [employer] and [employer]. She should be redirected to purse disability with her attorney  and reapply of Medicaid   Social Determinants of Health   Financial Resource Strain:   . Difficulty of Paying Living Expenses:   Food Insecurity:   . Worried About Charity fundraiser in the Last Year:   . Arboriculturist in the Last Year:   Transportation Needs:   . Film/video editor (Medical):   [name] Lack of Transportation (Non-Medical):   Physical Activity:   . Days of Exercise per Week:   . Minutes of Exercise per Session:   Stress:   . Feeling of Stress :   Social Connections:   . Frequency of Communication with Friends and Family:   . Frequency of Social Gatherings with Friends and Family:   . Attends Religious Services:   . Active Member of Clubs or Organizations:   . Attends Archivist Meetings:   [name] Marital Status:   Intimate Partner Violence:   . Fear of Current or Ex-Partner:   . Emotionally Abused:   [name] Physically Abused:   . Sexually Abused:     Family History  Problem Relation Age of Onset  . Asthma Mother   . Diabetes Father   . Stroke Father   . Heart disease Father   . Arthritis Sister   . Asthma Sister   . Asthma Daughter   . Asthma Son   . Arthritis Sister   . Asthma Sister     Past Surgical History:  Procedure Laterality Date  . BACK SURGERY    . CESAREAN SECTION    . CESAREAN SECTION    . FRACTURE SURGERY    . TUBAL LIGATION      ROS: Review of Systems Negative except as stated above  PHYSICAL EXAM: BP 100/68   Pulse 84   Temp 97.9 F (36.6 C)   Resp 16   Wt (!) 385 lb 12.8 oz (175 kg)   LMP 03/24/2009   SpO2 (!) 87%   BMI 66.22 kg/m   Pulse ox at rest on 3 L O2 is 98%. Wt Readings from Last 3 Encounters:  09/16/19 (!) 385 lb 12.8 oz (175 kg)  08/15/19 (!) 400 lb 12.7 oz (181.8 kg)  06/30/19 (!) 426 lb 9.4 oz (193.5 kg)    Physical Exam General  appearance - alert, morbidly obese Caucasian female who appears unkept  and in no distress Mental status - normal mood, behavior, speech, dress, motor activity, and thought processes Neck - supple, no significant adenopathy Chest -breath sounds decreased but no wheezes or crackles heard  heart - normal rate, regular rhythm, normal S1, S2, no murmurs, rubs, clicks or gallops Extremities -no lower extremity edema at this time  skin -slightly red waxy appearing rash in the right axilla.  Patient declines going through hassle of taking down her pants for me to see the small boil on the upper thighs   CMP Latest Ref Rng & Units 08/16/2019 08/15/2019 08/14/2019  Glucose 70 - 99 mg/dL 176(H) 144(H) 151(H)  BUN 6 - 20 mg/dL 82(H) 80(H) 59(H)  Creatinine 0.44 - 1.00 mg/dL 1.86(H) 2.06(H) 1.87(H)  Sodium 135 - 145 mmol/L 140 138 138  Potassium 3.5 - 5.1 mmol/L 4.0 4.6 5.0  Chloride 98 - 111 mmol/L 92(L) 92(L) 91(L)  CO2 22 - 32 mmol/L 37(H) 35(H) 35(H)  Calcium 8.9 - 10.3 mg/dL 8.7(L) 8.6(L) 8.9  Total Protein 6.5 - 8.1 g/dL - - -  Total Bilirubin 0.3 - 1.2 mg/dL - - -  Alkaline Phos 38 -  126 U/L - - -  AST 15 - 41 U/L - - -  ALT 0 - 44 U/L - - -   Lipid Panel     Component Value Date/Time   CHOL 190 12/01/2017 1541   TRIG 261 (H) 12/01/2017 1541   HDL 37 (L) 12/01/2017 1541   CHOLHDL 5.1 (H) 12/01/2017 1541   CHOLHDL 4.9 12/14/2015 0303   VLDL 26 12/14/2015 0303   LDLCALC 101 (H) 12/01/2017 1541    CBC    Component Value Date/Time   WBC 9.1 08/15/2019 0619   RBC 4.16 08/15/2019 0619   HGB 11.6 (L) 08/15/2019 0619   HGB 11.2 01/24/2019 1646   HCT 40.0 08/15/2019 0619   HCT 35.2 01/24/2019 1646   PLT 276 08/15/2019 0619   PLT 226 01/24/2019 1646   MCV 96.2 08/15/2019 0619   MCV 94 01/24/2019 1646   MCH 27.9 08/15/2019 0619   MCHC 29.0 (L) 08/15/2019 0619   RDW 15.2 08/15/2019 0619   RDW 12.8 01/24/2019 1646   LYMPHSABS 1.5 08/11/2019 0438   MONOABS 0.4 08/11/2019 0438    EOSABS 0.0 08/11/2019 0438   BASOSABS 0.0 08/11/2019 0438    ASSESSMENT AND PLAN: 1. Hospital discharge follow-up 2. Chronic hypoxemic respiratory failure (HCC) I will send prescription to her oxygen supplier to request portable oxygen  3. Chronic diastolic CHF (congestive heart failure) (HCC) She has no edema of the lower extremities on exam today and her weight is down 15 pounds.  Does not appear to be fluid overloaded.  Continue furosemide, isosorbide.  Encouraged her to limit salt in the foods - Basic Metabolic Panel - Pro b natriuretic peptide (BNP)  4. OSA (obstructive sleep apnea) Patient reports that she now has a CPAP machine.  I encourage continued compliance  5. Stage 3a chronic kidney disease Check BMP today  6. Primary osteoarthritis of both knees On Tylenol with codeine to use as needed.  7. Intertrigo Advised patient that she can use the nystatin powder in the right axilla  8. Moderate persistent asthma without complication Continue oxygen Use nebulizer treatments of Pulmicort and as prescribed  9. Anxiety I declined to give prescription for Xanax.  Patient on Tylenol with codeine in combination with Xanax with obstructive sleep apnea puts her at risk.  Recommend referral to psychiatry for management of her anxiety but patient declined.  I will send prescription to her pharmacy for some clindamycin for the boils.   Patient was given the opportunity to ask questions.  Patient verbalized understanding of the plan and was able to repeat key elements of the plan.   Orders Placed This Encounter  Procedures  . Basic Metabolic Panel  . Pro b natriuretic peptide (BNP)     Requested Prescriptions    No prescriptions requested or ordered in this encounter    Return in about 2 months (around 02-Dec-2019).  Karle Plumber, MD, FACP

## 2019-09-17 LAB — BASIC METABOLIC PANEL
BUN/Creatinine Ratio: 14 (ref 9–23)
BUN: 23 mg/dL (ref 6–24)
CO2: 30 mmol/L — ABNORMAL HIGH (ref 20–29)
Calcium: 9 mg/dL (ref 8.7–10.2)
Chloride: 97 mmol/L (ref 96–106)
Creatinine, Ser: 1.63 mg/dL — ABNORMAL HIGH (ref 0.57–1.00)
GFR calc Af Amer: 41 mL/min/{1.73_m2} — ABNORMAL LOW (ref >59)
GFR calc non Af Amer: 35 mL/min/{1.73_m2} — ABNORMAL LOW (ref >59)
Glucose: 129 mg/dL — ABNORMAL HIGH (ref 65–99)
Potassium: 4.2 mmol/L (ref 3.5–5.2)
Sodium: 144 mmol/L (ref 134–144)

## 2019-09-17 LAB — PRO B NATRIURETIC PEPTIDE: NT-Pro BNP: 185 pg/mL (ref 0–287)

## 2019-09-17 NOTE — Progress Notes (Signed)
Let patient know that her kidney function is not 100% but stable and slightly improved compared to 1 month ago.

## 2019-09-19 ENCOUNTER — Other Ambulatory Visit: Payer: Self-pay | Admitting: Internal Medicine

## 2019-09-20 ENCOUNTER — Telehealth: Payer: Self-pay | Admitting: Internal Medicine

## 2019-09-20 ENCOUNTER — Telehealth: Payer: Self-pay

## 2019-09-20 NOTE — Telephone Encounter (Signed)
Met with the patient when she was in the clinic today. She signed the SCAT application and this CM will complete now and send to SCAT eligibility.  She had not authorized consent to release medical information and she had not responded to calls from this CM requesting call back to complete the application.   She inquired about her labwork results from last week, primarily her kidney function. . Informed her that as her Dr Wynetta Emery, her kidney function is not 100% but is stable and slightly improved compared to 1 month ago.  The patient requested a copy of the results and that was given to her.  Explained to her that [employer] has not been able to reach her to discuss the POC ordered. Provided her with the phone number for [employer] and instructed her to call them asap.   Also provided her with the phone number for partners ending homelessness co-ordinated re-entry program, [employer] and the phone number for [employer] to register for [employer] transportation.  She also requested another listing of apartment rentals available and said she could spend up to $625/month.   The rental listing from [employer] within her price range was given to her.   She also noted that her power chair needs some work as she may need a new battery for the chair. She said that she has tried to [employer] account planner.  Informed her that this CM will try to contact the company for her.

## 2019-09-22 ENCOUNTER — Telehealth: Payer: Self-pay

## 2019-09-22 NOTE — Telephone Encounter (Signed)
SCAT application faxed to [employer]

## 2019-09-27 ENCOUNTER — Telehealth: Payer: Self-pay

## 2019-09-27 NOTE — Telephone Encounter (Signed)
Call placed to [employer] # [phone] to report patient's problems with her power chair battery. Message left on the physician line requesting a call back to this CM # [phone]

## 2019-09-28 ENCOUNTER — Other Ambulatory Visit: Payer: Self-pay | Admitting: Internal Medicine

## 2019-09-28 ENCOUNTER — Telehealth: Payer: Self-pay

## 2019-09-28 NOTE — Telephone Encounter (Signed)
Requested medication (s) are due for refill today: Yes  Requested medication (s) are on the active medication list: Yes  Last refill:  09/08/19  Future visit scheduled: Yes  Notes to clinic:  Sending to the provider, unsure if this was prescribed for an acute issue or if this will be ongoing     Requested Prescriptions  Pending Prescriptions Disp Refills   benzonatate (TESSALON) 100 MG capsule [Pharmacy Med Name: benzonatate 100 mg capsule] 30 capsule 0    Sig: TAKE ONE CAPSULE BY MOUTH EVERY 6 HOURS AS NEEDED FOR COUGH      Ear, Nose, and Throat:  Antitussives/Expectorants Passed - 09/28/2019  3:52 PM      Passed - Valid encounter within last 12 months    Recent Outpatient Visits           1 week ago Hospital discharge follow-up   [place] Karle Plumber B, MD   2 months ago Acute on chronic respiratory failure with hypoxia and hypercapnia [facility])   [place] Elsie Stain, MD   5 months ago Chronic diastolic heart failure [facility])   [place], MD   8 months ago Chronic diastolic heart failure [facility])   [place], MD   9 months ago Primary osteoarthritis of both knees   [place], MD       Future Appointments             In 1 month Wynetta Emery, Dalbert Batman, MD [place]

## 2019-09-28 NOTE — Telephone Encounter (Signed)
Contacted pt to go over lab results pt didn't answer lvm asking pt to give a call back at her earliest convenience  

## 2019-09-30 NOTE — Telephone Encounter (Signed)
Call placed to [employer], spoke to [employer] who stated that their company has filed for bankruptcy and they are not able to act on the patient's request for assist with her power chair battery.  They contacted Judson Roch ( no last name or direct phone number ) at [facility] and provided her with the patient's information and have asked that they find a local company that can assist the patient.  The number he provided for [facility] Medicaid # [id number].   Call placed to patient # [phone] to inform her of above. Message left with call back requested to this CM  # [phone]

## 2019-10-10 ENCOUNTER — Other Ambulatory Visit: Payer: Self-pay | Admitting: Internal Medicine

## 2019-10-10 DIAGNOSIS — M170 Bilateral primary osteoarthritis of knee: Secondary | ICD-10-CM

## 2019-10-20 ENCOUNTER — Other Ambulatory Visit: Payer: Self-pay | Admitting: Internal Medicine

## 2019-10-24 ENCOUNTER — Other Ambulatory Visit: Payer: Self-pay | Admitting: Internal Medicine

## 2019-10-25 NOTE — Telephone Encounter (Signed)
Completed.

## 2019-10-26 ENCOUNTER — Other Ambulatory Visit: Payer: Self-pay | Admitting: Internal Medicine

## 2019-10-27 ENCOUNTER — Telehealth: Payer: Self-pay

## 2019-10-27 NOTE — Telephone Encounter (Signed)
This CM spoke to [place] Johnson/[employer] who confirmed that the patient has been re-certified for services

## 2019-11-02 ENCOUNTER — Other Ambulatory Visit: Payer: Self-pay | Admitting: Internal Medicine

## 2019-11-02 MED ORDER — ACETAMINOPHEN-CODEINE #3 300-30 MG PO TABS: 1.0000 | ORAL_TABLET | Freq: Three times a day (TID) | ORAL | 0 refills | Status: AC | PRN

## 2019-11-02 NOTE — Telephone Encounter (Signed)
Requested medication (s) are due for refill today- yes  Requested medication (s) are on the active medication list -yes  Future visit scheduled -yes  Last refill: 09/19/19  Notes to clinic: Request for non delegated Rx  Requested Prescriptions  Pending Prescriptions Disp Refills   acetaminophen-codeine (TYLENOL #3) 300-30 MG tablet 90 tablet 0    Sig: Take 1 tablet by mouth every 8 (eight) hours as needed for moderate pain.      Not Delegated - Analgesics:  Opioid Agonist Combinations Failed - 11/02/2019  2:54 PM      Failed - This refill cannot be delegated      Passed - Urine Drug Screen completed in last 360 days.      Passed - Valid encounter within last 6 months    Recent Outpatient Visits           1 month ago Hospital discharge follow-up   [place] Karle Plumber B, MD   3 months ago Acute on chronic respiratory failure with hypoxia and hypercapnia [facility])   [place] Elsie Stain, MD   6 months ago Chronic diastolic heart failure [facility])   [place] Ladell Pier, MD   9 months ago Chronic diastolic heart failure [facility])   [place] Ladell Pier, MD   10 months ago Primary osteoarthritis of both [place], [place], MD       Future Appointments             In 2 weeks Ladell Pier, MD [place]                Requested Prescriptions  Pending Prescriptions Disp Refills   acetaminophen-codeine (TYLENOL #3) 300-30 MG tablet 90 tablet 0    Sig: Take 1 tablet by mouth every 8 (eight) hours as needed for moderate pain.      Not Delegated - Analgesics:  Opioid Agonist Combinations Failed - 11/02/2019  2:54 PM      Failed - This refill cannot be delegated      Passed - Urine Drug Screen completed in last 360 days.      Passed - Valid  encounter within last 6 months    Recent Outpatient Visits           1 month ago Hospital discharge follow-up   [place] Karle Plumber B, MD   3 months ago Acute on chronic respiratory failure with hypoxia and hypercapnia [facility])   [place] Elsie Stain, MD   6 months ago Chronic diastolic heart failure [facility])   [place], MD   9 months ago Chronic diastolic heart failure [facility])   [place], MD   10 months ago Primary osteoarthritis of both [place], MD       Future Appointments             In 2 weeks Ladell Pier, MD [place]

## 2019-11-02 NOTE — Telephone Encounter (Signed)
Medication Refill - Medication:acetaminophen-codeine (TYLENOL #3) 300-30 MG tablet    Has the patient contacted their pharmacy? yes (Agent: If no, request that the patient contact the pharmacy for the refill.) (Agent: If yes, when and what did the pharmacy advise?)contact pcp  Preferred Pharmacy (with phone number or street name):  [place], [place] Phone:  [phone]  Fax:  [phone]       Agent: Please be advised that RX refills may take up to 3 business days. We ask that you follow-up with your pharmacy.

## 2019-11-02 NOTE — Telephone Encounter (Signed)
Will forward to pcp

## 2019-11-02 NOTE — Telephone Encounter (Signed)
Please advise.   Copied from [place] [id number]. Topic: General - Other >> Nov 02, 2019  2:41 PM Rainey Pines A wrote: Patient would like a callback from  Dr. Bard Herbert nurse in regards to her lisinopril medication refill request denial. Patient stated she was unaware that the medication has been discontinued. Please advise

## 2019-11-03 ENCOUNTER — Other Ambulatory Visit: Payer: Self-pay | Admitting: Internal Medicine

## 2019-11-09 ENCOUNTER — Emergency Department (HOSPITAL_COMMUNITY): Payer: Medicaid Other

## 2019-11-09 ENCOUNTER — Emergency Department (HOSPITAL_COMMUNITY)
Admission: EM | Admit: 2019-11-09 | Discharge: 2019-11-09 | Disposition: A | Discharge status: Home / Self Care | Payer: Medicaid Other | Source: Home / Self Care

## 2019-11-09 DIAGNOSIS — R0602 Shortness of breath: Secondary | ICD-10-CM | POA: Insufficient documentation

## 2019-11-09 DIAGNOSIS — Z20822 Contact with and (suspected) exposure to covid-19: Secondary | ICD-10-CM | POA: Insufficient documentation

## 2019-11-09 DIAGNOSIS — Z5321 Procedure and treatment not carried out due to patient leaving prior to being seen by health care provider: Secondary | ICD-10-CM | POA: Insufficient documentation

## 2019-11-09 LAB — CBC
HCT: 45.5 % (ref 36.0–46.0)
Hemoglobin: 12.2 g/dL (ref 12.0–15.0)
MCH: 26.4 pg (ref 26.0–34.0)
MCHC: 26.8 g/dL — ABNORMAL LOW (ref 30.0–36.0)
MCV: 98.5 fL (ref 80.0–100.0)
Platelets: 448 10*3/uL — ABNORMAL HIGH (ref 150–400)
RBC: 4.62 MIL/uL (ref 3.87–5.11)
RDW: 17.4 % — ABNORMAL HIGH (ref 11.5–15.5)
WBC: 13.7 10*3/uL — ABNORMAL HIGH (ref 4.0–10.5)
nRBC: 1.2 % — ABNORMAL HIGH (ref 0.0–0.2)

## 2019-11-09 LAB — BASIC METABOLIC PANEL
Anion gap: 10 (ref 5–15)
BUN: 24 mg/dL — ABNORMAL HIGH (ref 6–20)
CO2: 34 mmol/L — ABNORMAL HIGH (ref 22–32)
Calcium: 8.1 mg/dL — ABNORMAL LOW (ref 8.9–10.3)
Chloride: 100 mmol/L (ref 98–111)
Creatinine, Ser: 1.55 mg/dL — ABNORMAL HIGH (ref 0.44–1.00)
GFR calc Af Amer: 43 mL/min — ABNORMAL LOW (ref >60)
GFR calc non Af Amer: 37 mL/min — ABNORMAL LOW (ref >60)
Glucose, Bld: 119 mg/dL — ABNORMAL HIGH (ref 70–99)
Potassium: 4.4 mmol/L (ref 3.5–5.1)
Sodium: 144 mmol/L (ref 135–145)

## 2019-11-09 LAB — SARS CORONAVIRUS 2 BY RT PCR (HOSPITAL ORDER, PERFORMED IN ~~LOC~~ HOSPITAL LAB): SARS Coronavirus 2: NEGATIVE

## 2019-11-09 NOTE — ED Triage Notes (Signed)
Pt here from home via EMS for eval of shob and rest and with exertion x 2 days. Denies sick contacts. Wears 3L home O2.

## 2019-11-10 ENCOUNTER — Inpatient Hospital Stay (HOSPITAL_COMMUNITY)
Admission: EM | Admit: 2019-11-10 | Discharge: 2019-11-23 | DRG: 870 | Disposition: E | Payer: Medicaid Other | Attending: Internal Medicine | Admitting: Internal Medicine

## 2019-11-10 ENCOUNTER — Encounter (HOSPITAL_COMMUNITY): Payer: Self-pay | Admitting: Emergency Medicine

## 2019-11-10 ENCOUNTER — Emergency Department (HOSPITAL_COMMUNITY): Payer: Medicaid Other

## 2019-11-10 ENCOUNTER — Inpatient Hospital Stay (HOSPITAL_COMMUNITY): Payer: Medicaid Other

## 2019-11-10 ENCOUNTER — Other Ambulatory Visit: Payer: Self-pay

## 2019-11-10 DIAGNOSIS — Z6841 Body Mass Index (BMI) 40.0 and over, adult: Secondary | ICD-10-CM | POA: Diagnosis not present

## 2019-11-10 DIAGNOSIS — K219 Gastro-esophageal reflux disease without esophagitis: Secondary | ICD-10-CM | POA: Diagnosis present

## 2019-11-10 DIAGNOSIS — E873 Alkalosis: Secondary | ICD-10-CM | POA: Diagnosis not present

## 2019-11-10 DIAGNOSIS — R6521 Severe sepsis with septic shock: Secondary | ICD-10-CM | POA: Diagnosis not present

## 2019-11-10 DIAGNOSIS — J80 Acute respiratory distress syndrome: Secondary | ICD-10-CM | POA: Diagnosis present

## 2019-11-10 DIAGNOSIS — E662 Morbid (severe) obesity with alveolar hypoventilation: Secondary | ICD-10-CM | POA: Diagnosis present

## 2019-11-10 DIAGNOSIS — J9621 Acute and chronic respiratory failure with hypoxia: Secondary | ICD-10-CM

## 2019-11-10 DIAGNOSIS — J45909 Unspecified asthma, uncomplicated: Secondary | ICD-10-CM | POA: Diagnosis present

## 2019-11-10 DIAGNOSIS — M1990 Unspecified osteoarthritis, unspecified site: Secondary | ICD-10-CM | POA: Diagnosis present

## 2019-11-10 DIAGNOSIS — Z452 Encounter for adjustment and management of vascular access device: Secondary | ICD-10-CM

## 2019-11-10 DIAGNOSIS — A4189 Other specified sepsis: Secondary | ICD-10-CM | POA: Diagnosis present

## 2019-11-10 DIAGNOSIS — Z881 Allergy status to other antibiotic agents status: Secondary | ICD-10-CM

## 2019-11-10 DIAGNOSIS — Z882 Allergy status to sulfonamides status: Secondary | ICD-10-CM

## 2019-11-10 DIAGNOSIS — I130 Hypertensive heart and chronic kidney disease with heart failure and stage 1 through stage 4 chronic kidney disease, or unspecified chronic kidney disease: Secondary | ICD-10-CM | POA: Diagnosis present

## 2019-11-10 DIAGNOSIS — G92 Toxic encephalopathy: Secondary | ICD-10-CM | POA: Diagnosis present

## 2019-11-10 DIAGNOSIS — Z880 Allergy status to penicillin: Secondary | ICD-10-CM

## 2019-11-10 DIAGNOSIS — D649 Anemia, unspecified: Secondary | ICD-10-CM | POA: Diagnosis present

## 2019-11-10 DIAGNOSIS — R579 Shock, unspecified: Secondary | ICD-10-CM | POA: Diagnosis not present

## 2019-11-10 DIAGNOSIS — E872 Acidosis: Secondary | ICD-10-CM | POA: Diagnosis present

## 2019-11-10 DIAGNOSIS — J181 Lobar pneumonia, unspecified organism: Secondary | ICD-10-CM | POA: Diagnosis not present

## 2019-11-10 DIAGNOSIS — J9622 Acute and chronic respiratory failure with hypercapnia: Secondary | ICD-10-CM

## 2019-11-10 DIAGNOSIS — J9602 Acute respiratory failure with hypercapnia: Secondary | ICD-10-CM | POA: Diagnosis not present

## 2019-11-10 DIAGNOSIS — Z79899 Other long term (current) drug therapy: Secondary | ICD-10-CM

## 2019-11-10 DIAGNOSIS — Z87891 Personal history of nicotine dependence: Secondary | ICD-10-CM

## 2019-11-10 DIAGNOSIS — Z66 Do not resuscitate: Secondary | ICD-10-CM | POA: Diagnosis not present

## 2019-11-10 DIAGNOSIS — R0902 Hypoxemia: Secondary | ICD-10-CM

## 2019-11-10 DIAGNOSIS — R0602 Shortness of breath: Secondary | ICD-10-CM | POA: Diagnosis present

## 2019-11-10 DIAGNOSIS — M109 Gout, unspecified: Secondary | ICD-10-CM | POA: Diagnosis present

## 2019-11-10 DIAGNOSIS — I491 Atrial premature depolarization: Secondary | ICD-10-CM | POA: Diagnosis present

## 2019-11-10 DIAGNOSIS — T4275XA Adverse effect of unspecified antiepileptic and sedative-hypnotic drugs, initial encounter: Secondary | ICD-10-CM | POA: Diagnosis present

## 2019-11-10 DIAGNOSIS — I5082 Biventricular heart failure: Secondary | ICD-10-CM | POA: Diagnosis present

## 2019-11-10 DIAGNOSIS — Z825 Family history of asthma and other chronic lower respiratory diseases: Secondary | ICD-10-CM

## 2019-11-10 DIAGNOSIS — J189 Pneumonia, unspecified organism: Secondary | ICD-10-CM | POA: Diagnosis present

## 2019-11-10 DIAGNOSIS — Q828 Other specified congenital malformations of skin: Secondary | ICD-10-CM

## 2019-11-10 DIAGNOSIS — Z8614 Personal history of Methicillin resistant Staphylococcus aureus infection: Secondary | ICD-10-CM

## 2019-11-10 DIAGNOSIS — G9341 Metabolic encephalopathy: Secondary | ICD-10-CM | POA: Diagnosis not present

## 2019-11-10 DIAGNOSIS — R569 Unspecified convulsions: Secondary | ICD-10-CM | POA: Diagnosis not present

## 2019-11-10 DIAGNOSIS — N1832 Chronic kidney disease, stage 3b: Secondary | ICD-10-CM | POA: Diagnosis present

## 2019-11-10 DIAGNOSIS — Z886 Allergy status to analgesic agent status: Secondary | ICD-10-CM

## 2019-11-10 DIAGNOSIS — Z8261 Family history of arthritis: Secondary | ICD-10-CM

## 2019-11-10 DIAGNOSIS — Z9109 Other allergy status, other than to drugs and biological substances: Secondary | ICD-10-CM

## 2019-11-10 DIAGNOSIS — Z8249 Family history of ischemic heart disease and other diseases of the circulatory system: Secondary | ICD-10-CM

## 2019-11-10 DIAGNOSIS — F419 Anxiety disorder, unspecified: Secondary | ICD-10-CM | POA: Diagnosis present

## 2019-11-10 DIAGNOSIS — E785 Hyperlipidemia, unspecified: Secondary | ICD-10-CM | POA: Diagnosis present

## 2019-11-10 DIAGNOSIS — I5033 Acute on chronic diastolic (congestive) heart failure: Secondary | ICD-10-CM | POA: Diagnosis present

## 2019-11-10 DIAGNOSIS — N179 Acute kidney failure, unspecified: Secondary | ICD-10-CM | POA: Diagnosis present

## 2019-11-10 DIAGNOSIS — Z20822 Contact with and (suspected) exposure to covid-19: Secondary | ICD-10-CM | POA: Diagnosis present

## 2019-11-10 DIAGNOSIS — Z9981 Dependence on supplemental oxygen: Secondary | ICD-10-CM

## 2019-11-10 DIAGNOSIS — J9620 Acute and chronic respiratory failure, unspecified whether with hypoxia or hypercapnia: Secondary | ICD-10-CM | POA: Diagnosis present

## 2019-11-10 DIAGNOSIS — Z833 Family history of diabetes mellitus: Secondary | ICD-10-CM

## 2019-11-10 DIAGNOSIS — Z7951 Long term (current) use of inhaled steroids: Secondary | ICD-10-CM

## 2019-11-10 LAB — BRAIN NATRIURETIC PEPTIDE
B Natriuretic Peptide: 835.1 pg/mL — ABNORMAL HIGH (ref 0.0–100.0)
B Natriuretic Peptide: 2225.3 pg/mL — ABNORMAL HIGH (ref 0.0–100.0)

## 2019-11-10 LAB — LACTIC ACID, PLASMA
Lactic Acid, Venous: 1.3 mmol/L (ref 0.5–1.9)
Lactic Acid, Venous: 2.3 mmol/L (ref 0.5–1.9)

## 2019-11-10 LAB — CBC WITH DIFFERENTIAL/PLATELET
Abs Immature Granulocytes: 0.22 10*3/uL — ABNORMAL HIGH (ref 0.00–0.07)
Basophils Absolute: 0.1 10*3/uL (ref 0.0–0.1)
Basophils Relative: 0 %
Eosinophils Absolute: 0 10*3/uL (ref 0.0–0.5)
Eosinophils Relative: 0 %
HCT: 47.4 % — ABNORMAL HIGH (ref 36.0–46.0)
Hemoglobin: 12.3 g/dL (ref 12.0–15.0)
Immature Granulocytes: 1 %
Lymphocytes Relative: 11 %
Lymphs Abs: 1.8 10*3/uL (ref 0.7–4.0)
MCH: 26.1 pg (ref 26.0–34.0)
MCHC: 25.9 g/dL — ABNORMAL LOW (ref 30.0–36.0)
MCV: 100.4 fL — ABNORMAL HIGH (ref 80.0–100.0)
Monocytes Absolute: 1.1 10*3/uL — ABNORMAL HIGH (ref 0.1–1.0)
Monocytes Relative: 7 %
Neutro Abs: 12.9 10*3/uL — ABNORMAL HIGH (ref 1.7–7.7)
Neutrophils Relative %: 81 %
Platelets: 483 10*3/uL — ABNORMAL HIGH (ref 150–400)
RBC: 4.72 MIL/uL (ref 3.87–5.11)
RDW: 17.5 % — ABNORMAL HIGH (ref 11.5–15.5)
WBC: 16.1 10*3/uL — ABNORMAL HIGH (ref 4.0–10.5)
nRBC: 2.1 % — ABNORMAL HIGH (ref 0.0–0.2)

## 2019-11-10 LAB — CBC
HCT: 42.4 % (ref 36.0–46.0)
Hemoglobin: 11.3 g/dL — ABNORMAL LOW (ref 12.0–15.0)
MCH: 26.4 pg (ref 26.0–34.0)
MCHC: 26.7 g/dL — ABNORMAL LOW (ref 30.0–36.0)
MCV: 99.1 fL (ref 80.0–100.0)
Platelets: 345 10*3/uL (ref 150–400)
RBC: 4.28 MIL/uL (ref 3.87–5.11)
RDW: 17.2 % — ABNORMAL HIGH (ref 11.5–15.5)
WBC: 14.7 10*3/uL — ABNORMAL HIGH (ref 4.0–10.5)
nRBC: 1.4 % — ABNORMAL HIGH (ref 0.0–0.2)

## 2019-11-10 LAB — COMPREHENSIVE METABOLIC PANEL
ALT: 143 U/L — ABNORMAL HIGH (ref 0–44)
ALT: 132 U/L — ABNORMAL HIGH (ref 0–44)
AST: 163 U/L — ABNORMAL HIGH (ref 15–41)
AST: 122 U/L — ABNORMAL HIGH (ref 15–41)
Albumin: 3.6 g/dL (ref 3.5–5.0)
Albumin: 3.2 g/dL — ABNORMAL LOW (ref 3.5–5.0)
Alkaline Phosphatase: 84 U/L (ref 38–126)
Alkaline Phosphatase: 75 U/L (ref 38–126)
Anion gap: 10 (ref 5–15)
Anion gap: 13 (ref 5–15)
BUN: 26 mg/dL — ABNORMAL HIGH (ref 6–20)
BUN: 29 mg/dL — ABNORMAL HIGH (ref 6–20)
CO2: 34 mmol/L — ABNORMAL HIGH (ref 22–32)
CO2: 32 mmol/L (ref 22–32)
Calcium: 8 mg/dL — ABNORMAL LOW (ref 8.9–10.3)
Calcium: 7.8 mg/dL — ABNORMAL LOW (ref 8.9–10.3)
Chloride: 100 mmol/L (ref 98–111)
Chloride: 99 mmol/L (ref 98–111)
Creatinine, Ser: 1.79 mg/dL — ABNORMAL HIGH (ref 0.44–1.00)
Creatinine, Ser: 1.83 mg/dL — ABNORMAL HIGH (ref 0.44–1.00)
GFR calc Af Amer: 36 mL/min — ABNORMAL LOW (ref >60)
GFR calc Af Amer: 35 mL/min — ABNORMAL LOW (ref >60)
GFR calc non Af Amer: 31 mL/min — ABNORMAL LOW (ref >60)
GFR calc non Af Amer: 31 mL/min — ABNORMAL LOW (ref >60)
Glucose, Bld: 144 mg/dL — ABNORMAL HIGH (ref 70–99)
Glucose, Bld: 134 mg/dL — ABNORMAL HIGH (ref 70–99)
Potassium: 4.8 mmol/L (ref 3.5–5.1)
Potassium: 4.6 mmol/L (ref 3.5–5.1)
Sodium: 144 mmol/L (ref 135–145)
Sodium: 144 mmol/L (ref 135–145)
Total Bilirubin: 1.3 mg/dL — ABNORMAL HIGH (ref 0.3–1.2)
Total Bilirubin: 1.6 mg/dL — ABNORMAL HIGH (ref 0.3–1.2)
Total Protein: 7.5 g/dL (ref 6.5–8.1)
Total Protein: 6.5 g/dL (ref 6.5–8.1)

## 2019-11-10 LAB — GLUCOSE, CAPILLARY
Glucose-Capillary: 120 mg/dL — ABNORMAL HIGH (ref 70–99)
Glucose-Capillary: 115 mg/dL — ABNORMAL HIGH (ref 70–99)
Glucose-Capillary: 120 mg/dL — ABNORMAL HIGH (ref 70–99)
Glucose-Capillary: 112 mg/dL — ABNORMAL HIGH (ref 70–99)
Glucose-Capillary: 115 mg/dL — ABNORMAL HIGH (ref 70–99)

## 2019-11-10 LAB — URINALYSIS, ROUTINE W REFLEX MICROSCOPIC
Bilirubin Urine: NEGATIVE
Glucose, UA: NEGATIVE mg/dL
Hgb urine dipstick: NEGATIVE
Ketones, ur: NEGATIVE mg/dL
Leukocytes,Ua: NEGATIVE
Nitrite: NEGATIVE
Protein, ur: 100 mg/dL — AB
Specific Gravity, Urine: 1.025 (ref 1.005–1.030)
pH: 5 (ref 5.0–8.0)

## 2019-11-10 LAB — CBG MONITORING, ED: Glucose-Capillary: 147 mg/dL — ABNORMAL HIGH (ref 70–99)

## 2019-11-10 LAB — POCT I-STAT 7, (LYTES, BLD GAS, ICA,H+H)
Acid-Base Excess: 15 mmol/L — ABNORMAL HIGH (ref 0.0–2.0)
Bicarbonate: 39.3 mmol/L — ABNORMAL HIGH (ref 20.0–28.0)
Calcium, Ion: 0.98 mmol/L — ABNORMAL LOW (ref 1.15–1.40)
HCT: 36 % (ref 36.0–46.0)
Hemoglobin: 12.2 g/dL (ref 12.0–15.0)
O2 Saturation: 99 %
Potassium: 4.5 mmol/L (ref 3.5–5.1)
Sodium: 145 mmol/L (ref 135–145)
TCO2: 41 mmol/L — ABNORMAL HIGH (ref 22–32)
pCO2 arterial: 46.9 mmHg (ref 32.0–48.0)
pH, Arterial: 7.531 — ABNORMAL HIGH (ref 7.350–7.450)
pO2, Arterial: 115 mmHg — ABNORMAL HIGH (ref 83.0–108.0)

## 2019-11-10 LAB — RAPID URINE DRUG SCREEN, HOSP PERFORMED
Amphetamines: NOT DETECTED
Barbiturates: NOT DETECTED
Benzodiazepines: NOT DETECTED
Cocaine: NOT DETECTED
Opiates: POSITIVE — AB
Tetrahydrocannabinol: NOT DETECTED

## 2019-11-10 LAB — AMYLASE: Amylase: 37 U/L (ref 28–100)

## 2019-11-10 LAB — APTT: aPTT: 30 seconds (ref 24–36)

## 2019-11-10 LAB — TRIGLYCERIDES: Triglycerides: 201 mg/dL — ABNORMAL HIGH (ref <150)

## 2019-11-10 LAB — PROTIME-INR
INR: 1.2 (ref 0.8–1.2)
Prothrombin Time: 15 seconds (ref 11.4–15.2)

## 2019-11-10 LAB — PHOSPHORUS: Phosphorus: 4.9 mg/dL — ABNORMAL HIGH (ref 2.5–4.6)

## 2019-11-10 LAB — BLOOD GAS, ARTERIAL
Acid-Base Excess: 6 mmol/L — ABNORMAL HIGH (ref 0.0–2.0)
Acid-Base Excess: 6.7 mmol/L — ABNORMAL HIGH (ref 0.0–2.0)
Bicarbonate: 35 mmol/L — ABNORMAL HIGH (ref 20.0–28.0)
Bicarbonate: 38.6 mmol/L — ABNORMAL HIGH (ref 20.0–28.0)
Drawn by: 257081
FIO2: 70
FIO2: 100
O2 Saturation: 96.7 %
O2 Saturation: 83 %
Patient temperature: 37
Patient temperature: 36.6
pCO2 arterial: 111 mmHg (ref 32.0–48.0)
pCO2 arterial: >120 mmHg (ref 32.0–48.0)
pH, Arterial: 7.127 — CL (ref 7.350–7.450)
pH, Arterial: 6.972 — CL (ref 7.350–7.450)
pO2, Arterial: 106 mmHg (ref 83.0–108.0)
pO2, Arterial: 69.5 mmHg — ABNORMAL LOW (ref 83.0–108.0)

## 2019-11-10 LAB — TROPONIN I (HIGH SENSITIVITY): Troponin I (High Sensitivity): 12 ng/L (ref <18)

## 2019-11-10 LAB — HEMOGLOBIN A1C
Hgb A1c MFr Bld: 6.2 % — ABNORMAL HIGH (ref 4.8–5.6)
Mean Plasma Glucose: 131.24 mg/dL

## 2019-11-10 LAB — MAGNESIUM: Magnesium: 2.4 mg/dL (ref 1.7–2.4)

## 2019-11-10 LAB — MRSA PCR SCREENING: MRSA by PCR: POSITIVE — AB

## 2019-11-10 LAB — PROCALCITONIN: Procalcitonin: 0.47 ng/mL

## 2019-11-10 LAB — LIPASE, BLOOD: Lipase: 27 U/L (ref 11–51)

## 2019-11-10 MED ORDER — MIDAZOLAM HCL 2 MG/2ML IJ SOLN
2.0000 mg | INTRAMUSCULAR | Status: DC | PRN
  Administered 2019-11-11 (×2): 2 mg via INTRAVENOUS
  Filled 2019-11-10 (×2): qty 2

## 2019-11-10 MED ORDER — MIDAZOLAM HCL 2 MG/2ML IJ SOLN
2.0000 mg | INTRAMUSCULAR | Status: AC | PRN
  Administered 2019-11-10 – 2019-11-11 (×3): 2 mg via INTRAVENOUS
  Filled 2019-11-10 (×4): qty 2

## 2019-11-10 MED ORDER — DOCUSATE SODIUM 100 MG PO CAPS: 100.0000 mg | ORAL_CAPSULE | Freq: Two times a day (BID) | ORAL | Status: DC | PRN

## 2019-11-10 MED ORDER — SODIUM CHLORIDE 0.9 % IV SOLN: 1000.0000 mL | INTRAVENOUS | Status: DC

## 2019-11-10 MED ORDER — LEVOFLOXACIN IN D5W 500 MG/100ML IV SOLN: 500.0000 mg | INTRAVENOUS | Status: DC

## 2019-11-10 MED ORDER — IPRATROPIUM-ALBUTEROL 0.5-2.5 (3) MG/3ML IN SOLN
3.0000 mL | RESPIRATORY_TRACT | Status: DC
  Administered 2019-11-10 – 2019-11-11 (×4): 3 mL via RESPIRATORY_TRACT
  Filled 2019-11-10 (×4): qty 3

## 2019-11-10 MED ORDER — NOREPINEPHRINE 4 MG/250ML-% IV SOLN
0.0000 ug/min | INTRAVENOUS | Status: DC
  Administered 2019-11-10: 2 ug/min via INTRAVENOUS
  Administered 2019-11-13: 5 ug/min via INTRAVENOUS
  Administered 2019-11-14: 9 ug/min via INTRAVENOUS
  Administered 2019-11-14: 14 ug/min via INTRAVENOUS
  Administered 2019-11-14: 6 ug/min via INTRAVENOUS
  Administered 2019-11-15: 16 ug/min via INTRAVENOUS
  Filled 2019-11-10 (×6): qty 250

## 2019-11-10 MED ORDER — METHYLPREDNISOLONE SODIUM SUCC 125 MG IJ SOLR
125.0000 mg | Freq: Once | INTRAMUSCULAR | Status: AC
  Administered 2019-11-10: 125 mg via INTRAVENOUS
  Filled 2019-11-10: qty 2

## 2019-11-10 MED ORDER — FUROSEMIDE 10 MG/ML IJ SOLN
60.0000 mg | Freq: Once | INTRAMUSCULAR | Status: AC
  Administered 2019-11-10: 60 mg via INTRAVENOUS
  Filled 2019-11-10: qty 6

## 2019-11-10 MED ORDER — CHLORHEXIDINE GLUCONATE 0.12% ORAL RINSE (MEDLINE KIT)
15.0000 mL | Freq: Two times a day (BID) | OROMUCOSAL | Status: DC
  Administered 2019-11-10 – 2019-11-15 (×10): 15 mL via OROMUCOSAL

## 2019-11-10 MED ORDER — NALOXONE HCL 4 MG/10ML IJ SOLN
1.0000 mg/h | INTRAVENOUS | Status: DC
  Filled 2019-11-10: qty 10

## 2019-11-10 MED ORDER — PHENYLEPHRINE HCL-NACL 10-0.9 MG/250ML-% IV SOLN
25.0000 ug/min | INTRAVENOUS | Status: DC
  Administered 2019-11-10: 100 ug/min via INTRAVENOUS
  Filled 2019-11-10: qty 250

## 2019-11-10 MED ORDER — DOCUSATE SODIUM 50 MG/5ML PO LIQD
100.0000 mg | Freq: Two times a day (BID) | ORAL | Status: DC
  Filled 2019-11-10: qty 10

## 2019-11-10 MED ORDER — ROCURONIUM BROMIDE 50 MG/5ML IV SOLN
INTRAVENOUS | Status: AC | PRN
  Administered 2019-11-10: 100 mg via INTRAVENOUS

## 2019-11-10 MED ORDER — LEVOFLOXACIN IN D5W 750 MG/150ML IV SOLN
750.0000 mg | INTRAVENOUS | Status: DC
  Administered 2019-11-11 – 2019-11-14 (×4): 750 mg via INTRAVENOUS
  Filled 2019-11-10 (×4): qty 150

## 2019-11-10 MED ORDER — VANCOMYCIN HCL 1750 MG/350ML IV SOLN
1750.0000 mg | INTRAVENOUS | Status: DC
  Administered 2019-11-11: 1750 mg via INTRAVENOUS
  Filled 2019-11-10 (×2): qty 350

## 2019-11-10 MED ORDER — PANTOPRAZOLE SODIUM 40 MG IV SOLR
40.0000 mg | Freq: Every day | INTRAVENOUS | Status: DC
  Administered 2019-11-10: 40 mg via INTRAVENOUS
  Filled 2019-11-10: qty 40

## 2019-11-10 MED ORDER — POLYETHYLENE GLYCOL 3350 17 G PO PACK: 17.0000 g | PACK | Freq: Every day | ORAL | Status: DC

## 2019-11-10 MED ORDER — ONDANSETRON HCL 4 MG/2ML IJ SOLN
INTRAMUSCULAR | Status: AC
  Administered 2019-11-10: 2 mg
  Filled 2019-11-10: qty 2

## 2019-11-10 MED ORDER — METHYLPREDNISOLONE SODIUM SUCC 125 MG IJ SOLR
60.0000 mg | Freq: Two times a day (BID) | INTRAMUSCULAR | Status: DC
  Administered 2019-11-10 – 2019-11-11 (×3): 60 mg via INTRAVENOUS
  Filled 2019-11-10 (×3): qty 2

## 2019-11-10 MED ORDER — ORAL CARE MOUTH RINSE
15.0000 mL | OROMUCOSAL | Status: DC
  Administered 2019-11-10 – 2019-11-16 (×52): 15 mL via OROMUCOSAL

## 2019-11-10 MED ORDER — LEVOFLOXACIN IN D5W 500 MG/100ML IV SOLN
500.0000 mg | Freq: Once | INTRAVENOUS | Status: AC
  Administered 2019-11-10: 500 mg via INTRAVENOUS
  Filled 2019-11-10: qty 100

## 2019-11-10 MED ORDER — FENTANYL CITRATE (PF) 100 MCG/2ML IJ SOLN: 100.0000 ug | INTRAMUSCULAR | Status: DC | PRN

## 2019-11-10 MED ORDER — FENTANYL CITRATE (PF) 100 MCG/2ML IJ SOLN
50.0000 ug | INTRAMUSCULAR | Status: DC | PRN
  Administered 2019-11-10: 50 ug via INTRAVENOUS
  Filled 2019-11-10: qty 2

## 2019-11-10 MED ORDER — IPRATROPIUM BROMIDE 0.02 % IN SOLN
0.5000 mg | Freq: Once | RESPIRATORY_TRACT | Status: AC
  Administered 2019-11-10: 0.5 mg via RESPIRATORY_TRACT
  Filled 2019-11-10: qty 2.5

## 2019-11-10 MED ORDER — SODIUM CHLORIDE 0.9 % IV BOLUS (SEPSIS)
1000.0000 mL | Freq: Once | INTRAVENOUS | Status: AC
  Administered 2019-11-10: 1000 mL via INTRAVENOUS

## 2019-11-10 MED ORDER — FENTANYL CITRATE (PF) 100 MCG/2ML IJ SOLN
100.0000 ug | INTRAMUSCULAR | Status: DC | PRN
  Administered 2019-11-10 (×2): 100 ug via INTRAVENOUS
  Filled 2019-11-10 (×2): qty 2

## 2019-11-10 MED ORDER — SODIUM CHLORIDE 0.9 % IV SOLN: INTRAVENOUS | Status: DC

## 2019-11-10 MED ORDER — HEPARIN SODIUM (PORCINE) 5000 UNIT/ML IJ SOLN
5000.0000 [IU] | Freq: Three times a day (TID) | INTRAMUSCULAR | Status: DC
  Administered 2019-11-10 – 2019-11-15 (×17): 5000 [IU] via SUBCUTANEOUS
  Filled 2019-11-10 (×17): qty 1

## 2019-11-10 MED ORDER — CHLORHEXIDINE GLUCONATE CLOTH 2 % EX PADS
6.0000 | MEDICATED_PAD | Freq: Every day | CUTANEOUS | Status: DC
  Administered 2019-11-10 – 2019-11-15 (×5): 6 via TOPICAL

## 2019-11-10 MED ORDER — POLYETHYLENE GLYCOL 3350 17 G PO PACK: 17.0000 g | PACK | Freq: Every day | ORAL | Status: DC | PRN

## 2019-11-10 MED ORDER — FENTANYL CITRATE (PF) 100 MCG/2ML IJ SOLN
50.0000 ug | INTRAMUSCULAR | Status: DC | PRN
  Administered 2019-11-11 – 2019-11-12 (×2): 200 ug via INTRAVENOUS
  Administered 2019-11-12: 150 ug via INTRAVENOUS
  Administered 2019-11-12: 200 ug via INTRAVENOUS
  Administered 2019-11-12: 100 ug via INTRAVENOUS
  Filled 2019-11-10 (×3): qty 2

## 2019-11-10 MED ORDER — ALBUTEROL (5 MG/ML) CONTINUOUS INHALATION SOLN
10.0000 mg/h | INHALATION_SOLUTION | RESPIRATORY_TRACT | Status: DC
  Administered 2019-11-10: 10 mg/h via RESPIRATORY_TRACT
  Filled 2019-11-10: qty 20

## 2019-11-10 MED ORDER — NALOXONE HCL 2 MG/2ML IJ SOSY
PREFILLED_SYRINGE | INTRAMUSCULAR | Status: AC
  Administered 2019-11-10: 1 mg
  Filled 2019-11-10: qty 2

## 2019-11-10 MED ORDER — VANCOMYCIN HCL 10 G IV SOLR
2500.0000 mg | Freq: Once | INTRAVENOUS | Status: AC
  Administered 2019-11-10: 2500 mg via INTRAVENOUS
  Filled 2019-11-10: qty 2500

## 2019-11-10 MED ORDER — NALOXONE HCL 2 MG/2ML IJ SOSY
1.0000 mg | PREFILLED_SYRINGE | Freq: Once | INTRAMUSCULAR | Status: AC
  Administered 2019-11-10: 1 mg via INTRAVENOUS
  Filled 2019-11-10: qty 2

## 2019-11-10 MED ORDER — ETOMIDATE 2 MG/ML IV SOLN
INTRAVENOUS | Status: AC | PRN
  Administered 2019-11-10: 20 mg via INTRAVENOUS

## 2019-11-10 MED ORDER — PANTOPRAZOLE SODIUM 40 MG IV SOLR: 40.0000 mg | Freq: Every day | INTRAVENOUS | Status: DC

## 2019-11-10 MED ORDER — MUPIROCIN 2 % EX OINT
1.0000 "application " | TOPICAL_OINTMENT | Freq: Two times a day (BID) | CUTANEOUS | Status: AC
  Administered 2019-11-10 – 2019-11-15 (×10): 1 via NASAL
  Filled 2019-11-10: qty 22

## 2019-11-10 MED ORDER — PROPOFOL 1000 MG/100ML IV EMUL
0.0000 ug/kg/min | INTRAVENOUS | Status: DC
  Administered 2019-11-10: 40 ug/kg/min via INTRAVENOUS
  Administered 2019-11-10 (×5): 50 ug/kg/min via INTRAVENOUS
  Administered 2019-11-10: 10 ug/kg/min via INTRAVENOUS
  Administered 2019-11-10 – 2019-11-11 (×2): 50 ug/kg/min via INTRAVENOUS
  Administered 2019-11-11: 35 ug/kg/min via INTRAVENOUS
  Administered 2019-11-11 (×2): 50 ug/kg/min via INTRAVENOUS
  Administered 2019-11-11: 45 ug/kg/min via INTRAVENOUS
  Administered 2019-11-11 (×3): 35 ug/kg/min via INTRAVENOUS
  Administered 2019-11-11 (×2): 50 ug/kg/min via INTRAVENOUS
  Administered 2019-11-12 (×2): 30 ug/kg/min via INTRAVENOUS
  Administered 2019-11-12 (×3): 50 ug/kg/min via INTRAVENOUS
  Administered 2019-11-12: 20 ug/kg/min via INTRAVENOUS
  Administered 2019-11-12 (×2): 50 ug/kg/min via INTRAVENOUS
  Administered 2019-11-13: 25 ug/kg/min via INTRAVENOUS
  Administered 2019-11-13 (×3): 30 ug/kg/min via INTRAVENOUS
  Administered 2019-11-13: 25 ug/kg/min via INTRAVENOUS
  Administered 2019-11-13 (×2): 30 ug/kg/min via INTRAVENOUS
  Administered 2019-11-14: 25 ug/kg/min via INTRAVENOUS
  Administered 2019-11-14: 20 ug/kg/min via INTRAVENOUS
  Administered 2019-11-14 (×2): 25 ug/kg/min via INTRAVENOUS
  Administered 2019-11-15 (×3): 30 ug/kg/min via INTRAVENOUS
  Administered 2019-11-15: 25 ug/kg/min via INTRAVENOUS
  Administered 2019-11-15: 30 ug/kg/min via INTRAVENOUS
  Administered 2019-11-15: 25 ug/kg/min via INTRAVENOUS
  Administered 2019-11-15 – 2019-11-16 (×2): 30 ug/kg/min via INTRAVENOUS
  Filled 2019-11-10 (×29): qty 100
  Filled 2019-11-10: qty 200
  Filled 2019-11-10 (×16): qty 100

## 2019-11-10 MED ORDER — SODIUM CHLORIDE 0.9 % IV SOLN
250.0000 mL | INTRAVENOUS | Status: DC
  Administered 2019-11-10 – 2019-11-12 (×2): 250 mL via INTRAVENOUS

## 2019-11-10 NOTE — Procedures (Signed)
Central Venous Catheter Insertion Procedure Note  Kara Vincent  [id number]  07/30/1965  Date:11/15/2019  Time:10:03 AM   Provider Performing:Anthonette Lesage Loletha Grayer Tamala Julian   Procedure: Insertion of Non-tunneled Central Venous Catheter(36556) with US guidance (88502)   Indication(s) Difficult access  Consent Unable to obtain consent due to emergent nature of procedure.  Anesthesia Topical only with 1% lidocaine   Timeout Verified patient identification, verified procedure, site/side was marked, verified correct patient position, special equipment/implants available, medications/allergies/relevant history reviewed, required imaging and test results available.  Sterile Technique Maximal sterile technique including full sterile barrier drape, hand hygiene, sterile gown, sterile gloves, mask, hair covering, sterile ultrasound probe cover (if used).  Procedure Description Area of catheter insertion was cleaned with chlorhexidine and draped in sterile fashion.  With real-time ultrasound guidance a central venous catheter was placed into the left internal jugular vein. Nonpulsatile blood flow and easy flushing noted in all ports.  The catheter was sutured in place and sterile dressing applied.  Complications/Tolerance None; patient tolerated the procedure well. Chest X-ray is ordered to verify placement for internal jugular or subclavian cannulation.   Chest x-ray is not ordered for femoral cannulation.  EBL Minimal  Specimen(s) None

## 2019-11-10 NOTE — Code Documentation (Signed)
CCM at bedside. Dr. Leonette Monarch time out for intubation

## 2019-11-10 NOTE — Progress Notes (Signed)
RT reported (pre-intubation) critical results to MD pH 6.97 and CO2 greater than 120. RT will continue to monitor.

## 2019-11-10 NOTE — ED Notes (Signed)
Pt provided 1mg  Narcan, [place], MD at bedside, pt appears to be have more movement noted, pt responsive with incompressible speech with noted eye flutter at this time to painful tactile stimuli.

## 2019-11-10 NOTE — ED Notes (Signed)
Cardama,MD made this RN verbally aware of .5mg  Narcan Administration, per provider pt arousable with open eyes to painful tactile stimuli.

## 2019-11-10 NOTE — ED Notes (Signed)
7.5 ETT , 25 at the lip

## 2019-11-10 NOTE — ED Provider Notes (Addendum)
[facility] EMERGENCY DEPARTMENT Provider Note  CSN: [id number] Arrival date & time: 11/04/2019 0007  Chief Complaint(s) Shortness of Breath  ED Triage Notes [name], RN (Registered Nurse) . [name] . [name] Date of Service: 10/25/2019 12:33 AM . . Signed   Pt here earlier via EMS, pt c/o increased shortness of breath x 2 days. Hx CHF. Pt alert and oriented to self, Dr. Leonette Monarch present.       HPI Kara Vincent is a 54 y.o. female who arrived to the ED around 1830 for 2 days of SOB. Around 2100 her name was called for reassessment and she did not answer, thus removed from the system. She was later located and found to be lethargic and somnolent. I was called to triage to assess the patient. She was somnolent, but would arouse to gentle stimuli. She was slow to respond to questioning and spoke nonsensically. I requested a VBG which revealed respiratory acidosis with CO2 >97. When a room became available for the patient she was brought back and placed on BiPAP. Additional work up was initiated. She was started on empiric abx, given dounebs, and steroids.   Remainder of history, ROS, and physical exam limited due to patient's condition (AMS).  Level V Caveat.    HPI  Past Medical History Past Medical History:  Diagnosis Date  . Allergy   . Anxiety   . Arthritis   . Asthma   . Asthma   . CHF (congestive heart failure) ([place])   . CHF (congestive heart failure) ([place])   . Chronic abdominal pain   . Chronic kidney disease   . Chronic pain    "all over"  . 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 GERD (gastroesophageal reflux disease)   . Gout   . Hidradenitis   . HLD (hyperlipidemia) 12/14/2015  . Homelessness   . Hyperlipemia   . Hypertension   . Low back pain   . Morbid obesity ([place])    uses motor wheel chair  . MRSA (methicillin resistant Staphylococcus aureus)    states about a year ago  . On home O2    3L N/C O2  continuously  . OSA (obstructive sleep apnea)    non-compliant with CPAP  . Oxygen deficiency   . Renal insufficiency   . Sleep apnea    Patient Active Problem List   Diagnosis Date Noted  . Acute respiratory failure with hypercapnia ([place]) 11/04/2019  . Acute on chronic respiratory failure ([place]) 11/20/2019  . Bedbug bite with infection 08/10/2019  . Candidal skin infection 08/10/2019  . Chronic obstructive pulmonary disease with acute lower respiratory infection ([place]) 07/20/2019  . Cellulitis of both lower extremities 06/14/2019  . Cellulitis 06/14/2019  . BMI 60.0-69.9, adult ([place]) 12/01/2017  . Primary osteoarthritis of both knees 12/01/2017  . Anxiety 12/01/2017  . Impaired mobility 12/01/2017  . Hyperkalemia 07/10/2017  . Hypoxemia 01/22/2017  . Acute pulmonary edema ([place]) 12/14/2015  . Essential hypertension 12/14/2015  . HLD (hyperlipidemia) 12/14/2015  . GERD (gastroesophageal reflux disease) 12/14/2015  . Gout 12/14/2015  . Diastolic CHF, acute on chronic (HCC) 12/14/2015  . Darier's disease   . Leukocytosis   . Right-sided heart failure ([place]) 11/20/2014  . OSA (obstructive sleep apnea)   . CKD (chronic kidney disease) stage 3, GFR 30-59 ml/min (HCC) 11/17/2014  . Venous stasis 11/16/2014  . Acute on chronic respiratory failure with hypoxia and hypercapnia ([place]) 11/16/2014  .  Chronic pain 08/24/2011  . Obesity hypoventilation syndrome ([place]) 08/24/2011   Home Medication(s) Prior to Admission medications   Medication Sig Start Date End Date Taking? Authorizing Provider  acetaminophen-codeine (TYLENOL #3) 300-30 MG tablet Take 1 tablet by mouth every 8 (eight) hours as needed for moderate pain. 11/02/19   Ladell Pier, MD  albuterol (VENTOLIN HFA) 108 (90 Base) MCG/ACT inhaler Inhale 1-2 puffs into the lungs every 6 (six) hours as needed for wheezing or shortness of breath. 11/22/18 06/13/28  Ladell Pier, MD  allopurinol (ZYLOPRIM) 100 MG tablet TAKE ONE  TABLET BY MOUTH EVERY MORNING 08/16/19   Ladell Pier, MD  ALPRAZolam Duanne Moron) 0.5 MG tablet Take 1 tablet (0.5 mg total) by mouth 3 (three) times daily as needed for sleep or anxiety. 08/16/19 08/15/20  Georgette Shell, MD  arformoterol (BROVANA) 15 MCG/2ML NEBU Take 2 mLs (15 mcg total) by nebulization 2 (two) times daily. 06/29/19   Hosie Poisson, MD  benzonatate (TESSALON) 100 MG capsule TAKE ONE CAPSULE BY MOUTH EVERY 6 HOURS AS NEEDED FOR COUGH 10/20/19   Ladell Pier, MD  bisacodyl (DULCOLAX) 5 MG EC tablet Take 2 tablets (10 mg total) by mouth daily. 08/16/19   Georgette Shell, MD  budesonide (PULMICORT) 0.25 MG/2ML nebulizer solution Take 2 mLs (0.25 mg total) by nebulization 2 (two) times daily. 06/29/19   Hosie Poisson, MD  camphor-menthol [facility]) lotion Apply topically as needed for itching. 08/16/19   Georgette Shell, MD  cholecalciferol (VITAMIN D3) 25 MCG (1000 UNIT) tablet Take 1,000 Units by mouth daily.    [provider]  clindamycin (CLEOCIN) 300 MG capsule Take 1 capsule (300 mg total) by mouth 3 (three) times daily. 09/16/19   Ladell Pier, MD  cloNIDine (CATAPRES) 0.2 MG tablet TAKE ONE TABLET BY MOUTH TWICE DAILY Patient taking differently: Take 0.2 mg by mouth 2 (two) times daily.  07/21/19   Ladell Pier, MD  Collagen-Boron-Hyaluronic Acid ([employer] TRIPLE ACTION PO) Take 1 tablet by mouth daily.    [provider]  darifenacin (ENABLEX) 7.5 MG 24 hr tablet Take 1 tablet (7.5 mg total) by mouth daily. 07/20/19   Elsie Stain, MD  DULoxetine (CYMBALTA) 30 MG capsule TAKE ONE CAPSULE BY MOUTH DAILY 10/10/19   Ladell Pier, MD  furosemide (LASIX) 40 MG tablet Take 1 tablet (40 mg total) by mouth daily. 1 tab po daily 06/29/19   Hosie Poisson, MD  guaiFENesin-dextromethorphan (ROBITUSSIN DM) 100-10 MG/5ML syrup Take 5 mLs by mouth every 4 (four) hours as needed for cough. 06/29/19   Hosie Poisson, MD  hydrOXYzine  (ATARAX/VISTARIL) 50 MG tablet TAKE ONE TABLET BY MOUTH THREE TIMES DAILY AS NEEDED FOR ANXIETY OR FOR ITCHING Patient taking differently: Take 50 mg by mouth every 8 (eight) hours as needed for anxiety.  06/07/19   Ladell Pier, MD  ipratropium-albuterol (DUONEB) 0.5-2.5 (3) MG/3ML SOLN Take 3 mLs by nebulization every 6 (six) hours as needed (SOB). 06/30/19 08/09/28  Hosie Poisson, MD  isosorbide mononitrate (IMDUR) 30 MG 24 hr tablet Take 1 tablet (30 mg total) by mouth daily. 04/15/19   Ladell Pier, MD  ketoconazole (NIZORAL) 2 % shampoo Apply topically 2 (two) times a week. 08/18/19   Georgette Shell, MD  ketotifen (ZADITOR) 0.025 % ophthalmic solution Place 1 drop into both eyes 2 (two) times daily. 08/16/19   Georgette Shell, MD  Misc. Devices MISC Please provide patient with a  raised toilet seat that is covered under her current insurance plan. 03/29/18   Gildardo Pounds, NP  Misc. Devices MISC Heavy duty shower chair; Heavy duty potty chair. Dx: Osteoarthritis 10/13/18   Charlott Rakes, MD  mupirocin ointment (BACTROBAN) 2 % Place 1 application into the nose 2 (two) times daily. 08/16/19   Georgette Shell, MD  nystatin (MYCOSTATIN/NYSTOP) powder Apply topically 2 (two) times daily. 08/16/19   Georgette Shell, MD  Omega-3 Fatty Acids (FISH OIL) 1000 MG CAPS Take 1 capsule (1,000 mg total) by mouth daily. 06/30/19   Hosie Poisson, MD  pantoprazole (PROTONIX) 40 MG tablet Take 1 tablet (40 mg total) by mouth 2 (two) times daily before a meal. 09/08/19   Ladell Pier, MD  polyethylene glycol (MIRALAX / GLYCOLAX) 17 g packet Take 17 g by mouth daily as needed. Patient taking differently: Take 17 g by mouth daily as needed for mild constipation.  06/29/19   Hosie Poisson, MD  predniSONE (DELTASONE) 10 MG tablet Take 1 tablet (10 mg total) by mouth daily. Take 3 tablets daily for the first 3 days then 2 tablets daily for the next 3 days and then 1 tablet daily till all the  tablets are done. 08/16/19   Georgette Shell, MD  senna-docusate (SENOKOT-S) 8.6-50 MG tablet Take 1 tablet by mouth at bedtime as needed for mild constipation. 06/29/19   Hosie Poisson, MD  triamcinolone cream (KENALOG) 0.1 % Apply 616 application topically 2 (two) times daily. To rash on back 07/20/19   Elsie Stain, MD  vitamin B-12 (CYANOCOBALAMIN) 500 MCG tablet Take 500 mcg by mouth daily.    [provider]  Vitamin E 100 units TABS Take 1 tablet by mouth daily. 06/30/19   Hosie Poisson, MD  diphenhydrAMINE (SOMINEX) 25 MG tablet Take 25 mg by mouth at bedtime as needed.  06/14/11  [provider]                                                                                                                                    Past Surgical History Past Surgical History:  Procedure Laterality Date  . BACK SURGERY    . CESAREAN SECTION    . CESAREAN SECTION    . FRACTURE SURGERY    . TUBAL LIGATION     Family History Family History  Problem Relation Age of Onset  . Asthma Mother   . Diabetes Father   . Stroke Father   . Heart disease Father   . Arthritis Sister   . Asthma Sister   . Asthma Daughter   . Asthma Son   . Arthritis Sister   . Asthma Sister     Social History Social History   Tobacco Use  . Smoking status: Former Smoker    Types: Cigarettes    Quit date: 03/25/2003    Years since quitting: 16.6  . Smokeless tobacco:  Former Systems developer    Quit date: 05/27/1978  Vaping Use  . Vaping Use: Never used  Substance Use Topics  . Alcohol use: Yes    Alcohol/week: 0.0 standard drinks    Comment: years ago  no longer beer only  early 20's  . Drug use: Yes    Types: "Crack" cocaine, Other-see comments    Comment: years  ago   Allergies Ibuprofen, Penicillins, Sulfa antibiotics, Adhesive [tape], Doxycycline, Sulfamethoxazole-trimethoprim, and Zithromax [azithromycin]  Review of Systems Review of Systems  Unable to perform ROS: Mental status change     Physical Exam Vital Signs  I have reviewed the triage vital signs BP 137/76   Pulse 90   Temp 97.9 F (36.6 C) (Oral)   Resp (!) 26   Wt (!) 175 kg   LMP 03/24/2009   SpO2 (!) 79%   BMI 66.22 kg/m   Physical Exam Vitals reviewed.  Constitutional:      General: She is in acute distress.     Appearance: She is well-developed. She is morbidly obese. She is not diaphoretic.     Comments: Smells of urine  HENT:     Head: Normocephalic and atraumatic.     Nose: Nose normal.  Eyes:     General: No scleral icterus.       Right eye: No discharge.        Left eye: No discharge.     Conjunctiva/sclera: Conjunctivae normal.     Pupils: Pupils are equal, round, and reactive to light.  Cardiovascular:     Rate and Rhythm: Normal rate and regular rhythm.     Heart sounds: No murmur heard.  No friction rub. No gallop.   Pulmonary:     Effort: Pulmonary effort is normal. No respiratory distress.     Breath sounds: Normal breath sounds. No stridor. No rales.  Abdominal:     General: There is no distension.     Palpations: Abdomen is soft.     Tenderness: There is no abdominal tenderness.  Musculoskeletal:        General: No tenderness.     Cervical back: Normal range of motion and neck supple.  Skin:    General: Skin is warm and dry.     Findings: Lesion (numerous excoriated lesions throughout her torso) present. No erythema or rash.  Neurological:     Mental Status: She is lethargic.     ED Results and Treatments Labs (all labs ordered are listed, but only abnormal results are displayed) Labs Reviewed  CBC WITH DIFFERENTIAL/PLATELET - Abnormal; Notable for the following components:      Result Value   WBC 16.1 (*)    HCT 47.4 (*)    MCV 100.4 (*)    MCHC 25.9 (*)    RDW 17.5 (*)    Platelets 483 (*)    nRBC 2.1 (*)    Neutro Abs 12.9 (*)    Monocytes Absolute 1.1 (*)    Abs Immature Granulocytes 0.22 (*)    All other components within normal limits   COMPREHENSIVE METABOLIC PANEL - Abnormal; Notable for the following components:   CO2 34 (*)    Glucose, Bld 144 (*)    BUN 26 (*)    Creatinine, Ser 1.79 (*)    Calcium 8.0 (*)    AST 163 (*)    ALT 143 (*)    Total Bilirubin 1.3 (*)    GFR calc non Af Amer 31 (*)    GFR  calc Af Amer 36 (*)    All other components within normal limits  BRAIN NATRIURETIC PEPTIDE - Abnormal; Notable for the following components:   B Natriuretic Peptide 835.1 (*)    All other components within normal limits  URINALYSIS, ROUTINE W REFLEX MICROSCOPIC - Abnormal; Notable for the following components:   Color, Urine AMBER (*)    APPearance HAZY (*)    Protein, ur 100 (*)    Bacteria, UA FEW (*)    All other components within normal limits  RAPID URINE DRUG SCREEN, HOSP PERFORMED - Abnormal; Notable for the following components:   Opiates POSITIVE (*)    All other components within normal limits  BLOOD GAS, ARTERIAL - Abnormal; Notable for the following components:   pH, Arterial 7.127 (*)    pCO2 arterial 111 (*)    Bicarbonate 35.0 (*)    Acid-Base Excess 6.0 (*)    All other components within normal limits  BLOOD GAS, ARTERIAL - Abnormal; Notable for the following components:   pH, Arterial 6.972 (*)    pCO2 arterial >120 (*)    pO2, Arterial 69.5 (*)    Bicarbonate 38.6 (*)    Acid-Base Excess 6.7 (*)    Allens test (pass/fail) BRACHIAL ARTERY (*)    All other components within normal limits  CBG MONITORING, ED - Abnormal; Notable for the following components:   Glucose-Capillary 147 (*)    All other components within normal limits  URINE CULTURE  CULTURE, BLOOD (ROUTINE X 2) W REFLEX TO ID PANEL  CULTURE, BLOOD (ROUTINE X 2)  CULTURE, BLOOD (ROUTINE X 2)  URINE CULTURE  CULTURE, RESPIRATORY  LACTIC ACID, PLASMA  BLOOD GAS, VENOUS  TRIGLYCERIDES  COMPREHENSIVE METABOLIC PANEL  MAGNESIUM  PHOSPHORUS  PROCALCITONIN  CBC  PROTIME-INR  APTT  STREP PNEUMONIAE URINARY ANTIGEN  BLOOD  GAS, ARTERIAL  CBC  CREATININE, SERUM  AMYLASE  LIPASE, BLOOD  LACTIC ACID, PLASMA  LACTIC ACID, PLASMA  BRAIN NATRIURETIC PEPTIDE  I-STAT ARTERIAL BLOOD GAS, ED  I-STAT VENOUS BLOOD GAS, ED  TROPONIN I (HIGH SENSITIVITY)  TROPONIN I (HIGH SENSITIVITY)                                                                                                                         EKG  EKG Interpretation  Date/Time:    Ventricular Rate:    PR Interval:    QRS Duration:   QT Interval:    QTC Calculation:   R Axis:     Text Interpretation:        Radiology DG Chest 2 View  Result Date: 11/09/2019 CLINICAL DATA:  Shortness of breath and chest pain. EXAM: CHEST - 2 VIEW COMPARISON:  Aug 14, 2019 FINDINGS: Mildly increased perihilar pulmonary vasculature is seen. A mild to moderate severity right basilar atelectasis and/or infiltrate is noted. There is no evidence of a pleural effusion or pneumothorax. The cardiac silhouette is markedly enlarged. There is persistent widening of the superior  mediastinum. The visualized skeletal structures are unremarkable. IMPRESSION: 1. Cardiomegaly with mild perihilar pulmonary vascular congestion. 2. Mild to moderate severity right basilar atelectasis and/or infiltrate. Electronically Signed   By: Virgina Norfolk M.D.   On: 11/09/2019 19:39   DG Chest Port 1 View  Result Date: 10/30/2019 CLINICAL DATA:  Shortness of breath. EXAM: PORTABLE CHEST 1 VIEW COMPARISON:  Radiograph yesterday.  CT 06/14/2019 FINDINGS: Stable cardiomegaly. Vascular congestion. Stable streaky opacity at the right lung base. No pneumothorax. Left costophrenic angle is not entirely included in the field of view. IMPRESSION: 1. Stable cardiomegaly with vascular congestion. 2. Unchanged streaky opacity at the right lung base, may be atelectasis or pneumonia. 3. Technically limited exam due to habitus and portable technique. Electronically Signed   By: Keith Rake M.D.   On: 10/30/2019  00:56    Pertinent labs & imaging results that were available during my care of the patient were reviewed by me and considered in my medical decision making (see chart for details).  Medications Ordered in ED Medications  albuterol (PROVENTIL,VENTOLIN) solution continuous neb (10 mg/hr Nebulization New Bag/Given 11/12/2019 0414)  naloxone HCl (NARCAN) 4 mg in dextrose 5 % 250 mL infusion (has no administration in time range)  propofol (DIPRIVAN) 1000 MG/100ML infusion (has no administration in time range)  fentaNYL (SUBLIMAZE) injection 100 mcg (has no administration in time range)  fentaNYL (SUBLIMAZE) injection 100 mcg (has no administration in time range)  sodium chloride 0.9 % bolus 1,000 mL (has no administration in time range)    Followed by  0.9 %  sodium chloride infusion (has no administration in time range)  docusate sodium (COLACE) capsule 100 mg (has no administration in time range)  polyethylene glycol (MIRALAX / GLYCOLAX) packet 17 g (has no administration in time range)  pantoprazole (PROTONIX) injection 40 mg (has no administration in time range)  heparin injection 5,000 Units (has no administration in time range)  pantoprazole (PROTONIX) injection 40 mg (has no administration in time range)  0.9 %  sodium chloride infusion (has no administration in time range)  0.9 %  sodium chloride infusion (has no administration in time range)  phenylephrine (NEOSYNEPHRINE) 10-0.9 MG/250ML-% infusion (has no administration in time range)  ipratropium-albuterol (DUONEB) 0.5-2.5 (3) MG/3ML nebulizer solution 3 mL (has no administration in time range)  levofloxacin (LEVAQUIN) IVPB 500 mg (0 mg Intravenous Stopped 11/09/2019 0521)  furosemide (LASIX) injection 60 mg (60 mg Intravenous Given 11/05/2019 0356)  methylPREDNISolone sodium succinate (SOLU-MEDROL) 125 mg/2 mL injection 125 mg (125 mg Intravenous Given 11/11/2019 0356)  ipratropium (ATROVENT) nebulizer solution 0.5 mg (0.5 mg Nebulization  Given 10/29/2019 0414)  naloxone (NARCAN) injection 1 mg (1 mg Intravenous Given 11/12/2019 0525)  ondansetron (ZOFRAN) 4 MG/2ML injection (2 mg  Given 11/08/2019 0549)  naloxone (NARCAN) 2 MG/2ML injection (1 mg  Given 10/26/2019 0714)  etomidate (AMIDATE) injection (20 mg Intravenous Given 11/06/2019 0815)  rocuronium (ZEMURON) injection (100 mg Intravenous Given 11/07/2019 0816)  Procedures .Critical Care Performed by: Fatima Blank, MD Authorized by: Fatima Blank, MD   Critical care provider statement:    Critical care time (minutes):  80   Critical care start time:  11/21/2019 12:30 AM   Critical care end time:  11/19/2019 8:31 AM   Critical care time was exclusive of:  Separately billable procedures and treating other patients   Critical care was necessary to treat or prevent imminent or life-threatening deterioration of the following conditions:  Respiratory failure   Critical care was time spent personally by me on the following activities:  Blood draw for specimens, discussions with consultants, evaluation of patient's response to treatment, examination of patient, interpretation of cardiac output measurements, ventilator management, review of old charts, re-evaluation of patient's condition, pulse oximetry, ordering and review of radiographic studies, ordering and review of laboratory studies and ordering and performing treatments and interventions  Procedure Name: Intubation Date/Time: 10/23/2019 8:32 AM Performed by: Fatima Blank, MD Pre-anesthesia Checklist: Patient identified, Patient being monitored, Emergency Drugs available, Timeout performed and Suction available Oxygen Delivery Method: Non-rebreather mask Preoxygenation: Pre-oxygenation with 100% oxygen Induction Type: Rapid sequence Ventilation: Mask ventilation without  difficulty Laryngoscope Size: Glidescope Grade View: Grade II Tube size: 7.5 mm Number of attempts: 2 Airway Equipment and Method: Rigid stylet Placement Confirmation: ETT inserted through vocal cords under direct vision,  CO2 detector and Breath sounds checked- equal and bilateral Secured at: 25 cm Tube secured with: ETT holder Difficulty Due To: Difficulty was anticipated      (including critical care time)  Medical Decision Making / ED Course I have reviewed the nursing notes for this encounter and the patient's prior records (if available in EHR or on provided paperwork).   Abigael Mogle Sitzer was evaluated in Emergency Department on 11/09/2019 for the symptoms described in the history of present illness. She was evaluated in the context of the global COVID-19 pandemic, which necessitated consideration that the patient might be at risk for infection with the SARS-CoV-2 virus that causes COVID-19. Institutional protocols and algorithms that pertain to the evaluation of patients at risk for COVID-19 are in a state of rapid change based on information released by regulatory bodies including the CDC and federal and state organizations. These policies and algorithms were followed during the patient's care in the ED.  Patient had labs and imaging done during initial arrival.  CBC with mild leukocytosis. COVID negative. CXR with right basilar opacity.   On repeat labs Leukocytosis has worsened. CXR stable. VBG as above with resp acidosis. Patient started on Abx, given steroids and cont duonebs.  Currently on BiPAP. Will repeat ABG.  ABG with improving pH. Still hypercarbic.  UDS notable for +opiates. Given 1mg  of narcan. She became more responsive. Likely component of polypharm resulting in decreased respiratory drive leading to respiratory acidosis.  Do not feel that she needs intubation at this time given her improved mentation. Will continue Bipap.   UA negative.  Renal  function at baseline. BNP elevated. Given lasix.  Patient given addt'l 1 mg of narcan. Will start narcan gtt and repeat ABG  Will need admission to SDU. Spoke with Hospitalist service for admission. Plan to see how she responds to drip and f/u ABG.  8:35 AM Repeat ABG worse. Less responsive to narcan. Intubated for respiratory failure.  Admit to ICU.      Final Clinical Impression(s) / ED Diagnoses Final diagnoses:  SOB (shortness of breath)  Acute on chronic respiratory failure with hypercapnia (  [place])      This chart was dictated using voice recognition software.  Despite best efforts to proofread,  errors can occur which can change the documentation meaning.      Fatima Blank, MD 11/21/2019 [phone]

## 2019-11-10 NOTE — ED Notes (Signed)
Pt responsive to verbal stimuli following 1mg  IV Narcan, [place],MD at bedside. Pt able to open eyes intermittently upon request with eyes quickly closing.

## 2019-11-10 NOTE — ED Triage Notes (Signed)
Pt here earlier via EMS, pt c/o increased shortness of breath x 2 days. Hx CHF. Pt alert and oriented to self, Dr. Leonette Monarch present.

## 2019-11-10 NOTE — ED Notes (Signed)
Pt hypotensive at this time only responding to painful stimuli, Card[place], MD, Gwyndolyn Saxon RT and this RN at bedside at this time.

## 2019-11-10 NOTE — H&P (Signed)
NAME:  Kara Vincent, MRN:  [id number], DOB:  [birth year], LOS: 0 ADMISSION DATE:  November 26, 2019, CONSULTATION DATE: Nov 26, 2019 REFERRING MD: Emergency department physician, CHIEF COMPLAINT: Hypercarbic respiratory failure  Brief History   Morbid obese female with OHS OSA polypharmacy use intubated for hypercarbic tori failure  History of present illness   53 year old morbidly obese female who presented to emergency department today with shortness of breath.  Was found in the triage area to be very lethargic.  Plan transported back to the triage area placed on BiPAP.  He does have a history of polypharmacy..  She was on a responsive to Narcan required intubation physician pulmonary critical care called to the bedside to admit.  Past Medical History   Past Medical History:  Diagnosis Date  . Allergy   . Anxiety   . Arthritis   . Asthma   . Asthma   . CHF (congestive heart failure) (HCC)   . CHF (congestive heart failure) (HCC)   . Chronic abdominal pain   . Chronic kidney disease   . Chronic pain    "all over"  . 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 GERD (gastroesophageal reflux disease)   . Gout   . Hidradenitis   . HLD (hyperlipidemia) 12/14/2015  . Homelessness   . Hyperlipemia   . Hypertension   . Low back pain   . Morbid obesity (HCC)    uses motor wheel chair  . MRSA (methicillin resistant Staphylococcus aureus)    states about a year ago  . On home O2    3L N/C O2 continuously  . OSA (obstructive sleep apnea)    non-compliant with CPAP  . Oxygen deficiency   . Renal insufficiency   . Sleep apnea      Significant Hospital Events   Hypercarbic respiratory failure requiring intubation 11/10/1998  Consults:    Procedures:  819 intubation  Significant Diagnostic Tests:    Micro Data:  819 sputum Blood cultures x2 Antimicrobials:  8/19 Levaquin 8/19 vancomycin  Interim history/subjective:  Admitted to the intensive care unit  Objective    Blood pressure 137/76, pulse 90, temperature 97.9 F (36.6 C), temperature source Oral, resp. rate (!) 26, weight (!) 175 kg, last menstrual period 03/24/2009, SpO2 (!) 79 %.    FiO2 (%):  [60 %-70 %] 70 %   Intake/Output Summary (Last 24 hours) at 2019/11/26 0843 Last data filed at 11-26-2019 9562 Gross per 24 hour  Intake 101.67 ml  Output 300 ml  Net -198.33 ml   Filed Weights   2019/11/26 0800  Weight: (!) 175 kg    Examination: General: Morbidly obese female who is hypotensive HENT: Short neck no obvious JVD Lungs: Decreased air movement throughout Cardiovascular: Heart sounds are distant Abdomen: Morbidly obese large pannus decreased bowel sounds Extremities: 2-3+ edema multiple areas of Neuro: [facility] Problem list     Assessment & Plan:  Acute on chronic hypercarbic respiratory failure with possible pneumonia along with possible congestive heart failure volume overload.  Intubated Check arterial blood gas Follow-up chest x-ray Increase respiratory rate for hypercarbia Empirical antimicrobial therapy for Admit to the intensive care unit  Chronic renal insufficiency Lab Results  Component Value Date   CREATININE 1.79 (H) 26-Nov-2019   CREATININE 1.55 (H) 11/09/2019   CREATININE 1.63 (H) 09/16/2019  Monitor and avoid nephrotoxins  Cellulitis history of MRSA And vancomycin to pharmaceutical regimen  Hypotension Peripheral vasopressors Moderate volume Check procalcitonin Empirical  antimicrobial therapy  History of congestive heart failure Last 2D echo done in 2021 showed difficult evaluation due to body habitus.  EF was 60%. Diuresis as able   Best practice:  Diet: NPO Pain/Anxiety/Delirium protocol (if indicated): Sedation protocol VAP protocol (if indicated): In place DVT prophylaxis: Heparin GI prophylaxis: PPI Glucose control: Sliding scale insulin protocol Mobility: Bedrest Code Status: Full Family Communication: We will  attempt update family Disposition: Intubated mid to the intensive care unit  Labs   CBC: Recent Labs  Lab 11/09/19 1841 2019/11/13 0059  WBC 13.7* 16.1*  NEUTROABS  --  12.9*  HGB 12.2 12.3  HCT 45.5 47.4*  MCV 98.5 100.4*  PLT 448* 483*    Basic Metabolic Panel: Recent Labs  Lab 11/09/19 1841 Nov 13, 2019 0059  NA 144 144  K 4.4 4.8  CL 100 100  CO2 34* 34*  GLUCOSE 119* 144*  BUN 24* 26*  CREATININE 1.55* 1.79*  CALCIUM 8.1* 8.0*   GFR: Estimated Creatinine Clearance: 57.6 mL/min (A) (by C-G formula based on SCr of 1.79 mg/dL (H)). Recent Labs  Lab 11/09/19 1841 2019-11-13 0059 Nov 13, 2019 0236  WBC 13.7* 16.1*  --   LATICACIDVEN  --   --  1.3    Liver Function Tests: Recent Labs  Lab 13-Nov-2019 0059  AST 163*  ALT 143*  ALKPHOS 84  BILITOT 1.3*  PROT 7.5  ALBUMIN 3.6   No results for input(s): LIPASE, AMYLASE in the last 168 hours. No results for input(s): AMMONIA in the last 168 hours.  ABG    Component Value Date/Time   PHART 6.972 (LL) 13-Nov-2019 0755   PCO2ART >120 (HH) 13-Nov-2019 0755   PO2ART 69.5 (L) 11/13/2019 0755   HCO3 38.6 (H) 11/13/2019 0755   TCO2 34 (H) 07/10/2017 2056   O2SAT 83.0 November 13, 2019 0755     Coagulation Profile: No results for input(s): INR, PROTIME in the last 168 hours.  Cardiac Enzymes: No results for input(s): CKTOTAL, CKMB, CKMBINDEX, TROPONINI in the last 168 hours.  HbA1C: Hgb A1c MFr Bld  Date/Time Value Ref Range Status  06/27/2019 03:07 AM 6.0 (H) 4.8 - 5.6 % Final    Comment:    (NOTE) Pre diabetes:          5.7%-6.4% Diabetes:              >6.4% Glycemic control for   <7.0% adults with diabetes   12/01/2017 03:41 PM 5.5 4.8 - 5.6 % Final    Comment:             Prediabetes: 5.7 - 6.4          Diabetes: >6.4          Glycemic control for adults with diabetes: <7.0     CBG: Recent Labs  Lab Nov 13, 2019 0714  GLUCAP 147*    Review of Systems:   na  Past Medical History  She,  has a past medical  history of Allergy, Anxiety, Arthritis, Asthma, Asthma, CHF (congestive heart failure) (HCC), CHF (congestive heart failure) (HCC), Chronic abdominal pain, Chronic kidney disease, Chronic pain, Darier disease, GERD (gastroesophageal reflux disease), Gout, Hidradenitis, HLD (hyperlipidemia) (12/14/2015), Homelessness, Hyperlipemia, Hypertension, Low back pain, Morbid obesity (HCC), MRSA (methicillin resistant Staphylococcus aureus), On home O2, OSA (obstructive sleep apnea), Oxygen deficiency, Renal insufficiency, and Sleep apnea.   Surgical History    Past Surgical History:  Procedure Laterality Date  . BACK SURGERY    . CESAREAN SECTION    . CESAREAN SECTION    .  FRACTURE SURGERY    . TUBAL LIGATION       Social History   reports that she quit smoking about 16 years ago. Her smoking use included cigarettes. She quit smokeless tobacco use about 41 years ago. She reports current alcohol use. She reports current drug use. Drugs: "Crack" cocaine and Other-see comments.   Family History   Her family history includes Arthritis in her sister and sister; Asthma in her daughter, mother, sister, sister, and son; Diabetes in her father; Heart disease in her father; Stroke in her father.   Allergies Allergies  Allergen Reactions  . Ibuprofen Hives, Shortness Of Breath, Palpitations and Rash    Has tolerated toradol 12/2013 inpatient  . Penicillins Anaphylaxis, Hives and Rash    Has patient had a PCN reaction causing immediate rash, facial/tongue/throat swelling, SOB or lightheadedness with hypotension: Yes Has patient had a PCN reaction causing severe rash involving mucus membranes or skin necrosis: Yes Has patient had a PCN reaction that required hospitalization Unknown Has patient had a PCN reaction occurring within the last 10 years: Unknown If all of the above answers are "NO", then may proceed with Cephalosporin use.   . Sulfa Antibiotics Hives, Shortness Of Breath, Rash and Cough  .  Adhesive [Tape] Rash  . Doxycycline Hives, Swelling and Rash  . Sulfamethoxazole-Trimethoprim Rash  . Zithromax [Azithromycin] Hives, Swelling and Rash     Home Medications  Prior to Admission medications   Medication Sig Start Date End Date Taking? Authorizing Provider  acetaminophen-codeine (TYLENOL #3) 300-30 MG tablet Take 1 tablet by mouth every 8 (eight) hours as needed for moderate pain. 11/02/19   Marcine Matar, MD  albuterol (VENTOLIN HFA) 108 (90 Base) MCG/ACT inhaler Inhale 1-2 puffs into the lungs every 6 (six) hours as needed for wheezing or shortness of breath. 11/22/18 06/13/28  Marcine Matar, MD  allopurinol (ZYLOPRIM) 100 MG tablet TAKE ONE TABLET BY MOUTH EVERY MORNING 08/16/19   Marcine Matar, MD  ALPRAZolam Prudy Feeler) 0.5 MG tablet Take 1 tablet (0.5 mg total) by mouth 3 (three) times daily as needed for sleep or anxiety. 08/16/19 08/15/20  Alwyn Ren, MD  arformoterol (BROVANA) 15 MCG/2ML NEBU Take 2 mLs (15 mcg total) by nebulization 2 (two) times daily. 06/29/19   Kathlen Mody, MD  benzonatate (TESSALON) 100 MG capsule TAKE ONE CAPSULE BY MOUTH EVERY 6 HOURS AS NEEDED FOR COUGH 10/20/19   Marcine Matar, MD  bisacodyl (DULCOLAX) 5 MG EC tablet Take 2 tablets (10 mg total) by mouth daily. 08/16/19   Alwyn Ren, MD  budesonide (PULMICORT) 0.25 MG/2ML nebulizer solution Take 2 mLs (0.25 mg total) by nebulization 2 (two) times daily. 06/29/19   Kathlen Mody, MD  camphor-menthol [facility]) lotion Apply topically as needed for itching. 08/16/19   Alwyn Ren, MD  cholecalciferol (VITAMIN D3) 25 MCG (1000 UNIT) tablet Take 1,000 Units by mouth daily.    [provider]  clindamycin (CLEOCIN) 300 MG capsule Take 1 capsule (300 mg total) by mouth 3 (three) times daily. 09/16/19   Marcine Matar, MD  cloNIDine (CATAPRES) 0.2 MG tablet TAKE ONE TABLET BY MOUTH TWICE DAILY Patient taking differently: Take 0.2 mg by mouth 2 (two) times daily.   07/21/19   Marcine Matar, MD  Collagen-Boron-Hyaluronic Acid ([employer] [facility] TRIPLE ACTION PO) Take 1 tablet by mouth daily.    [provider]  darifenacin (ENABLEX) 7.5 MG 24 hr tablet Take 1 tablet (7.5 mg  total) by mouth daily. 07/20/19   Storm Frisk, MD  DULoxetine (CYMBALTA) 30 MG capsule TAKE ONE CAPSULE BY MOUTH DAILY 10/10/19   Marcine Matar, MD  furosemide (LASIX) 40 MG tablet Take 1 tablet (40 mg total) by mouth daily. 1 tab po daily 06/29/19   Kathlen Mody, MD  guaiFENesin-dextromethorphan (ROBITUSSIN DM) 100-10 MG/5ML syrup Take 5 mLs by mouth every 4 (four) hours as needed for cough. 06/29/19   Kathlen Mody, MD  hydrOXYzine (ATARAX/VISTARIL) 50 MG tablet TAKE ONE TABLET BY MOUTH THREE TIMES DAILY AS NEEDED FOR ANXIETY OR FOR ITCHING Patient taking differently: Take 50 mg by mouth every 8 (eight) hours as needed for anxiety.  06/07/19   Marcine Matar, MD  ipratropium-albuterol (DUONEB) 0.5-2.5 (3) MG/3ML SOLN Take 3 mLs by nebulization every 6 (six) hours as needed (SOB). 06/30/19 08/09/28  Kathlen Mody, MD  isosorbide mononitrate (IMDUR) 30 MG 24 hr tablet Take 1 tablet (30 mg total) by mouth daily. 04/15/19   Marcine Matar, MD  ketoconazole (NIZORAL) 2 % shampoo Apply topically 2 (two) times a week. 08/18/19   Alwyn Ren, MD  ketotifen (ZADITOR) 0.025 % ophthalmic solution Place 1 drop into both eyes 2 (two) times daily. 08/16/19   Alwyn Ren, MD  Misc. Devices MISC Please provide patient with a raised toilet seat that is covered under her current insurance plan. 03/29/18   Claiborne Rigg, NP  Misc. Devices MISC Heavy duty shower chair; Heavy duty potty chair. Dx: Osteoarthritis 10/13/18   Hoy Register, MD  mupirocin ointment (BACTROBAN) 2 % Place 1 application into the nose 2 (two) times daily. 08/16/19   Alwyn Ren, MD  nystatin (MYCOSTATIN/NYSTOP) powder Apply topically 2 (two) times daily. 08/16/19   Alwyn Ren, MD   Omega-3 Fatty Acids (FISH OIL) 1000 MG CAPS Take 1 capsule (1,000 mg total) by mouth daily. 06/30/19   Kathlen Mody, MD  pantoprazole (PROTONIX) 40 MG tablet Take 1 tablet (40 mg total) by mouth 2 (two) times daily before a meal. 09/08/19   Marcine Matar, MD  polyethylene glycol (MIRALAX / GLYCOLAX) 17 g packet Take 17 g by mouth daily as needed. Patient taking differently: Take 17 g by mouth daily as needed for mild constipation.  06/29/19   Kathlen Mody, MD  predniSONE (DELTASONE) 10 MG tablet Take 1 tablet (10 mg total) by mouth daily. Take 3 tablets daily for the first 3 days then 2 tablets daily for the next 3 days and then 1 tablet daily till all the tablets are done. 08/16/19   Alwyn Ren, MD  senna-docusate (SENOKOT-S) 8.6-50 MG tablet Take 1 tablet by mouth at bedtime as needed for mild constipation. 06/29/19   Kathlen Mody, MD  triamcinolone cream (KENALOG) 0.1 % Apply 453 application topically 2 (two) times daily. To rash on back 07/20/19   Storm Frisk, MD  vitamin B-12 (CYANOCOBALAMIN) 500 MCG tablet Take 500 mcg by mouth daily.    [provider]  Vitamin E 100 units TABS Take 1 tablet by mouth daily. 06/30/19   Kathlen Mody, MD  diphenhydrAMINE (SOMINEX) 25 MG tablet Take 25 mg by mouth at bedtime as needed.  06/14/11  [provider]     Critical care time: 55 min     Brett Canales Evangelyn Crouse ACNP Acute Care Nurse Practitioner [facility] Pulmonary/Critical Care Please consult Amion Nov 27, 2019, 8:44 AM

## 2019-11-10 NOTE — Progress Notes (Addendum)
Pharmacy Antibiotic Note  Kara Vincent is a 54 y.o. female admitted on 11/05/2019 with community acquired pneumonia and possible cellulitis with scabs all over skin on physical exam. Pharmacy has been consulted for vancomycin dosing. Pt is afebrile with increased WBC at 14.7, PCT 0.47, and LA 2.3. Pt QTc on 8/19 was 427. Notably, Pt has numerous documented severe allergies to penicillins, doxycycline, and azithromycin (anaphylaxis/hives/rash). This day 1 of therapy.   Plan: Vancomycin 2500 IV once followed by:  Vancomycin 1750 IV every 24 hours.  Goal trough 15-20 mcg/mL.  Increase to levofloxacin 750mg  IV every 24 hours Will continue to monitor s/sx of infection, clinical progress, renal function, and micro results   Height: 5\' 4"  (162.6 cm) Weight: (!) 175 kg (385 lb 12.9 oz) IBW/kg (Calculated) : 54.7  Temp (24hrs), Avg:97.7 F (36.5 C), Min:97.4 F (36.3 C), Max:97.9 F (36.6 C)  Recent Labs  Lab 11/09/19 1841 11/07/2019 0059 10/30/2019 0236 11/15/2019 1200  WBC 13.7* 16.1*  --  14.7*  CREATININE 1.55* 1.79*  --  1.83*  LATICACIDVEN  --   --  1.3 2.3*    Estimated Creatinine Clearance: 56.4 mL/min (A) (by C-G formula based on SCr of 1.83 mg/dL (H)).    Allergies  Allergen Reactions  . Ibuprofen Hives, Shortness Of Breath, Palpitations and Rash    Has tolerated toradol 12/2013 inpatient  . Penicillins Anaphylaxis, Hives and Rash    Has patient had a PCN reaction causing immediate rash, facial/tongue/throat swelling, SOB or lightheadedness with hypotension: Yes Has patient had a PCN reaction causing severe rash involving mucus membranes or skin necrosis: Yes Has patient had a PCN reaction that required hospitalization Unknown Has patient had a PCN reaction occurring within the last 10 years: Unknown If all of the above answers are "NO", then may proceed with Cephalosporin use.   . Sulfa Antibiotics Hives, Shortness Of Breath, Rash and Cough  . Adhesive [Tape] Rash  .  Doxycycline Hives, Swelling and Rash  . Sulfamethoxazole-Trimethoprim Rash  . Zithromax [Azithromycin] Hives, Swelling and Rash    Antimicrobials this admission: Levofloxacin 8/19> Vancomycin 8/19>  Microbiology results: 8/19 BCx: sent 8/19 UCx: sent 8/19 MRSA: ordered  Thank you for allowing pharmacy to be a part of this patient's care.  Carolin Guernsey  PGY1 Pharmacy Resident 10/31/2019 1:45 PM

## 2019-11-10 NOTE — Progress Notes (Signed)
[facility] was called overnight for admission for this patient.  She has hypercapnic respiratory failure with pCO2 111.  She has been excessively somnolent and unresponsive.  She responded to Narcan and BIPAP with some improvement but again lapsed back into unresponsiveness.  She was started on a Narcan drip.  As Narcan drips require ICU level of care, I have contacted PCCM and requested that they admit the patient to their service at this time.  Should her circumstances change, [facility] will be happy to accept the patient.   Carlyon Shadow, M.D.

## 2019-11-10 NOTE — Procedures (Signed)
Arterial Catheter Insertion Procedure Note  KAVINA CANTAVE  [id number]  1965/08/23  Date:11/12/2019  Time:1:36 PM    Provider Performing: Candee Furbish    Procedure: Insertion of Arterial Line [phone]) with US guidance ([id number])   Indication(s) Blood pressure monitoring and/or need for frequent ABGs  Consent Unable to obtain consent due to emergent nature of procedure.  Anesthesia None   Time Out Verified patient identification, verified procedure, site/side was marked, verified correct patient position, special equipment/implants available, medications/allergies/relevant history reviewed, required imaging and test results available.   Sterile Technique Maximal sterile technique including full sterile barrier drape, hand hygiene, sterile gown, sterile gloves, mask, hair covering, sterile ultrasound probe cover (if used).   Procedure Description Area of catheter insertion was cleaned with chlorhexidine and draped in sterile fashion. With real-time ultrasound guidance an arterial catheter was placed into the left radial artery.  Appropriate arterial tracings confirmed on monitor.     Complications/Tolerance None; patient tolerated the procedure well.   EBL Minimal   Specimen(s) None

## 2019-11-10 NOTE — ED Notes (Signed)
Cardama, MD at bedside giving 1mg  Narcan IV, pt appears to be responding well to Narcan, pt arousalable to loud speech and painful tactile stimuli.

## 2019-11-10 NOTE — ED Notes (Signed)
Narcan 1mg  given per Dr. Leonette Monarch--

## 2019-11-11 ENCOUNTER — Inpatient Hospital Stay (HOSPITAL_COMMUNITY): Payer: Medicaid Other

## 2019-11-11 DIAGNOSIS — R579 Shock, unspecified: Secondary | ICD-10-CM

## 2019-11-11 DIAGNOSIS — J9622 Acute and chronic respiratory failure with hypercapnia: Secondary | ICD-10-CM

## 2019-11-11 DIAGNOSIS — R569 Unspecified convulsions: Secondary | ICD-10-CM

## 2019-11-11 LAB — POCT I-STAT 7, (LYTES, BLD GAS, ICA,H+H)
Acid-Base Excess: 12 mmol/L — ABNORMAL HIGH (ref 0.0–2.0)
Bicarbonate: 36.1 mmol/L — ABNORMAL HIGH (ref 20.0–28.0)
Calcium, Ion: 0.99 mmol/L — ABNORMAL LOW (ref 1.15–1.40)
HCT: 31 % — ABNORMAL LOW (ref 36.0–46.0)
Hemoglobin: 10.5 g/dL — ABNORMAL LOW (ref 12.0–15.0)
O2 Saturation: 93 %
Patient temperature: 98.7
Potassium: 3.7 mmol/L (ref 3.5–5.1)
Sodium: 143 mmol/L (ref 135–145)
TCO2: 37 mmol/L — ABNORMAL HIGH (ref 22–32)
pCO2 arterial: 41.6 mmHg (ref 32.0–48.0)
pH, Arterial: 7.546 — ABNORMAL HIGH (ref 7.350–7.450)
pO2, Arterial: 59 mmHg — ABNORMAL LOW (ref 83.0–108.0)

## 2019-11-11 LAB — CBC
HCT: 35.6 % — ABNORMAL LOW (ref 36.0–46.0)
Hemoglobin: 9.9 g/dL — ABNORMAL LOW (ref 12.0–15.0)
MCH: 26.1 pg (ref 26.0–34.0)
MCHC: 27.8 g/dL — ABNORMAL LOW (ref 30.0–36.0)
MCV: 93.7 fL (ref 80.0–100.0)
Platelets: 258 10*3/uL (ref 150–400)
RBC: 3.8 MIL/uL — ABNORMAL LOW (ref 3.87–5.11)
RDW: 17.2 % — ABNORMAL HIGH (ref 11.5–15.5)
WBC: 13.3 10*3/uL — ABNORMAL HIGH (ref 4.0–10.5)
nRBC: 0 % (ref 0.0–0.2)

## 2019-11-11 LAB — URINE CULTURE

## 2019-11-11 LAB — BASIC METABOLIC PANEL
Anion gap: 14 (ref 5–15)
BUN: 32 mg/dL — ABNORMAL HIGH (ref 6–20)
CO2: 33 mmol/L — ABNORMAL HIGH (ref 22–32)
Calcium: 7.8 mg/dL — ABNORMAL LOW (ref 8.9–10.3)
Chloride: 99 mmol/L (ref 98–111)
Creatinine, Ser: 1.97 mg/dL — ABNORMAL HIGH (ref 0.44–1.00)
GFR calc Af Amer: 32 mL/min — ABNORMAL LOW (ref >60)
GFR calc non Af Amer: 28 mL/min — ABNORMAL LOW (ref >60)
Glucose, Bld: 163 mg/dL — ABNORMAL HIGH (ref 70–99)
Potassium: 3.9 mmol/L (ref 3.5–5.1)
Sodium: 146 mmol/L — ABNORMAL HIGH (ref 135–145)

## 2019-11-11 LAB — PHOSPHORUS: Phosphorus: 3.7 mg/dL (ref 2.5–4.6)

## 2019-11-11 LAB — GLUCOSE, CAPILLARY
Glucose-Capillary: 139 mg/dL — ABNORMAL HIGH (ref 70–99)
Glucose-Capillary: 137 mg/dL — ABNORMAL HIGH (ref 70–99)
Glucose-Capillary: 122 mg/dL — ABNORMAL HIGH (ref 70–99)
Glucose-Capillary: 114 mg/dL — ABNORMAL HIGH (ref 70–99)
Glucose-Capillary: 165 mg/dL — ABNORMAL HIGH (ref 70–99)
Glucose-Capillary: 150 mg/dL — ABNORMAL HIGH (ref 70–99)
Glucose-Capillary: 146 mg/dL — ABNORMAL HIGH (ref 70–99)

## 2019-11-11 LAB — MAGNESIUM: Magnesium: 2.4 mg/dL (ref 1.7–2.4)

## 2019-11-11 LAB — CREATININE, URINE, RANDOM: Creatinine, Urine: 90.66 mg/dL

## 2019-11-11 LAB — SODIUM, URINE, RANDOM: Sodium, Ur: 81 mmol/L

## 2019-11-11 MED ORDER — POLYETHYLENE GLYCOL 3350 17 G PO PACK
17.0000 g | PACK | Freq: Every day | ORAL | Status: DC
  Administered 2019-11-11 – 2019-11-15 (×5): 17 g
  Filled 2019-11-11 (×5): qty 1

## 2019-11-11 MED ORDER — VECURONIUM BOLUS VIA INFUSION
10.0000 mg | Freq: Once | INTRAVENOUS | Status: DC
  Filled 2019-11-11: qty 10

## 2019-11-11 MED ORDER — INSULIN ASPART 100 UNIT/ML ~~LOC~~ SOLN
0.0000 [IU] | SUBCUTANEOUS | Status: DC
  Administered 2019-11-11: 3 [IU] via SUBCUTANEOUS
  Administered 2019-11-11 (×3): 2 [IU] via SUBCUTANEOUS
  Administered 2019-11-12: 3 [IU] via SUBCUTANEOUS
  Administered 2019-11-12 (×2): 2 [IU] via SUBCUTANEOUS
  Administered 2019-11-12: 3 [IU] via SUBCUTANEOUS
  Administered 2019-11-13: 2 [IU] via SUBCUTANEOUS
  Administered 2019-11-13: 3 [IU] via SUBCUTANEOUS
  Administered 2019-11-13 – 2019-11-14 (×4): 2 [IU] via SUBCUTANEOUS
  Administered 2019-11-15 (×4): 3 [IU] via SUBCUTANEOUS
  Administered 2019-11-15: 2 [IU] via SUBCUTANEOUS
  Administered 2019-11-16 (×2): 3 [IU] via SUBCUTANEOUS

## 2019-11-11 MED ORDER — PROSOURCE TF PO LIQD: 45.0000 mL | Freq: Two times a day (BID) | ORAL | Status: DC

## 2019-11-11 MED ORDER — POTASSIUM CHLORIDE 20 MEQ/15ML (10%) PO SOLN
40.0000 meq | Freq: Once | ORAL | Status: AC
  Administered 2019-11-11: 40 meq
  Filled 2019-11-11: qty 30

## 2019-11-11 MED ORDER — VITAL HIGH PROTEIN PO LIQD
1000.0000 mL | ORAL | Status: DC
  Administered 2019-11-11 – 2019-11-15 (×4): 1000 mL

## 2019-11-11 MED ORDER — VITAL HIGH PROTEIN PO LIQD: 1000.0000 mL | ORAL | Status: DC

## 2019-11-11 MED ORDER — FUROSEMIDE 10 MG/ML IJ SOLN
40.0000 mg | Freq: Once | INTRAMUSCULAR | Status: AC
  Administered 2019-11-11: 40 mg via INTRAVENOUS
  Filled 2019-11-11: qty 4

## 2019-11-11 MED ORDER — PROSOURCE TF PO LIQD
90.0000 mL | Freq: Three times a day (TID) | ORAL | Status: DC
  Administered 2019-11-11 – 2019-11-15 (×14): 90 mL
  Filled 2019-11-11 (×14): qty 90

## 2019-11-11 MED ORDER — VECURONIUM BROMIDE 10 MG IV SOLR
INTRAVENOUS | Status: AC
  Filled 2019-11-11: qty 10

## 2019-11-11 MED ORDER — DOCUSATE SODIUM 50 MG/5ML PO LIQD
100.0000 mg | Freq: Two times a day (BID) | ORAL | Status: DC
  Administered 2019-11-11 – 2019-11-15 (×9): 100 mg
  Filled 2019-11-11 (×10): qty 10

## 2019-11-11 MED ORDER — IPRATROPIUM-ALBUTEROL 0.5-2.5 (3) MG/3ML IN SOLN
3.0000 mL | Freq: Two times a day (BID) | RESPIRATORY_TRACT | Status: DC
  Administered 2019-11-11 – 2019-11-15 (×9): 3 mL via RESPIRATORY_TRACT
  Filled 2019-11-11 (×9): qty 3

## 2019-11-11 MED ORDER — LEVETIRACETAM IN NACL 1000 MG/100ML IV SOLN
1000.0000 mg | Freq: Two times a day (BID) | INTRAVENOUS | Status: DC
  Administered 2019-11-11 – 2019-11-12 (×4): 1000 mg via INTRAVENOUS
  Filled 2019-11-11 (×4): qty 100

## 2019-11-11 MED ORDER — CHLORHEXIDINE GLUCONATE 0.12 % MT SOLN
OROMUCOSAL | Status: AC
  Filled 2019-11-11: qty 15

## 2019-11-11 MED ORDER — PANTOPRAZOLE SODIUM 40 MG PO PACK
40.0000 mg | PACK | Freq: Every day | ORAL | Status: DC
  Administered 2019-11-11 – 2019-11-15 (×5): 40 mg
  Filled 2019-11-11 (×5): qty 20

## 2019-11-11 MED ORDER — SODIUM CHLORIDE 0.9% FLUSH
10.0000 mL | Freq: Two times a day (BID) | INTRAVENOUS | Status: DC
  Administered 2019-11-11 – 2019-11-15 (×9): 10 mL

## 2019-11-11 MED ORDER — SODIUM CHLORIDE 0.9% FLUSH: 10.0000 mL | INTRAVENOUS | Status: DC | PRN

## 2019-11-11 NOTE — Progress Notes (Signed)
Pt transported from 2M02 to CT and back without complication. Pts respiratory status remained stable throughout transport w/no distress noted. RT will continue to monitor.

## 2019-11-11 NOTE — Progress Notes (Addendum)
Pt hypoxic 80-87 on 100% FiO2 on the ventilator. Pt extremely dyssynchronous with ventilator. Increased propofol to 58mcg/hr and gave 2mg  versed and 200mg  of fentanyl with minimal improvement. Pt having pink frothy secretions and CVP 17. Notified Dr.Gonzales and Elink. See new orders.

## 2019-11-11 NOTE — Progress Notes (Signed)
RT obtained ABG on pt with the following results. RT will continue to monitor.   Results for Kara Vincent, Kara Vincent (MRN [id number]) as of 11/11/2019 06:48  Ref. Range 11/11/2019 06:40  Sample type Unknown ARTERIAL  pH, Arterial Latest Ref Range: 7.35 - 7.45  7.546 (H)  pCO2 arterial Latest Ref Range: 32 - 48 mmHg 41.6  pO2, Arterial Latest Ref Range: 83 - 108 mmHg 59 (L)  TCO2 Latest Ref Range: 22 - 32 mmol/L 37 (H)  Acid-Base Excess Latest Ref Range: 0.0 - 2.0 mmol/L 12.0 (H)  Bicarbonate Latest Ref Range: 20.0 - 28.0 mmol/L 36.1 (H)  O2 Saturation Latest Units: % 93.0  Patient temperature Unknown 98.7 F  Collection site Unknown Brachial

## 2019-11-11 NOTE — Procedures (Signed)
Patient Name: LEITHA HYPPOLITE  MRN: [id number]  Epilepsy Attending: Lora Havens  Referring Physician/Provider: Dr. [name] Date: 11/11/2019 Duration: 26.38 minutes  Patient history: 54 year old morbidly obese female who was admitted for respiratory distress, currently intubated and then noted to have seizure-like activity.  EEG evaluate for seizures.  Level of alertness: lethargic  AEDs during EEG study: Propofol, Keppra  Technical aspects: This EEG study was done with scalp electrodes positioned according to the 10-20 International system of electrode placement. Electrical activity was acquired at a sampling rate of 500Hz  and reviewed with a high frequency filter of 70Hz  and a low frequency filter of 1Hz . EEG data were recorded continuously and digitally stored.   Description: EEG showed continuous generalized rhythmic 2 to 3 Hz delta slowing admixed with 13 to 15 Hz generalized beta activity.  Patient was noted to have right arm tremor-like movement as well as left arm movement (Per EEG tech annotation, difficult to visualize on camera). Concomitant EEG before, during and after the event did not show any EEG change to suggest seizure.  Hyperventilation and photic stimulation were not performed.     Of note, study was technically difficult as due to patient's body habitus tech was unable to place O2, P8 and P4 electrodes.  ABNORMALITY -Continuous slow, generalized  IMPRESSION: This technically difficult study is suggestive of severe diffuse encephalopathy, nonspecific to etiology but likely related to sedation.  No seizures or epileptiform discharges were seen throughout the recording.  Patient was noted to have right upper extremity tremor-like movement as well as left upper extremity movement which was difficult to visualize on camera without concomitant EEG change and was most likely not epileptic  If concern for interictal-ictal activity persists, consider repeating  study.  Hawley Pavia Barbra Sarks

## 2019-11-11 NOTE — Progress Notes (Signed)
Initial Nutrition Assessment  DOCUMENTATION CODES:   Morbid obesity  INTERVENTION:   Initiate tube feeding via OG tube: - Vital High Protein @ 25 ml/hr (600 ml/day) - ProSource TF 90 ml TID  Tube feeding regimen provides 840 kcal, 119 grams of protein, and 502 ml of H2O.   Tube feeding regimen and current propofol provides 1812 total kcal (>100% of needs).  NUTRITION DIAGNOSIS:   Inadequate oral intake related to inability to eat as evidenced by NPO status.  GOAL:   Provide needs based on ASPEN/SCCM guidelines  MONITOR:   Vent status, Labs, Weight trends, TF tolerance, I & O's  REASON FOR ASSESSMENT:   Ventilator, Consult Enteral/tube feeding initiation and management  ASSESSMENT:   54 year old female who presented on 8/19 with SOB. PMH of OSA/OHS, CHF, gout, HLD, GERD, HTN. Pt required intubation.   Discussed pt with RN and during ICU rounds. Per RN, pt utilizes motorized wheelchair to mobilize.  Received consult for tube feeding initiation and management. Noted pt exhibiting tonic clonic extremity movements overnight suspicious for seizure. CT head showing no abnormality and EEG was ordered.  OG tube in place.  Patient is currently intubated on ventilator support MV: 11.0 L/min Temp (24hrs), Avg:98.4 F (36.9 C), Min:97.9 F (36.6 C), Max:98.8 F (37.1 C) BP (cuff): 106/68 MAP (cuff): 80  Drips: Propofol: 36.8 ml/hr (provides 972 kcal daily from lipid) Levophed: 7.5 ml/hr NS: 100 ml/hr  Medications reviewed and include: colace, SSI q 4 hours, solu-medrol, protonix, miralax, IV abx  Labs reviewed: ionized calcium 0.99, TG 201, elevated LFTs, phosphorus 4.9 CBG's: 112-139 x 24 hours  UOP: 1800 ml x 24 hours OGT output: 900 ml x 24 hours I/O's: +1.8 L since admit  NUTRITION - FOCUSED PHYSICAL EXAM:    Most Recent Value  Orbital Region No depletion  Upper Arm Region No depletion  Thoracic and Lumbar Region No depletion  Buccal Region Unable to  assess  Temple Region No depletion  Clavicle Bone Region No depletion  Clavicle and Acromion Bone Region No depletion  Scapular Bone Region Unable to assess  Dorsal Hand No depletion  Patellar Region No depletion  Anterior Thigh Region No depletion  Posterior Calf Region No depletion  Edema (RD Assessment) Mild  [BUE, BLE]  Hair Reviewed  Eyes Unable to assess  Mouth Unable to assess  Skin Reviewed  Nails Reviewed       Diet Order:   Diet Order            Diet NPO time specified  Diet effective now                 EDUCATION NEEDS:   Not appropriate for education at this time  Skin:  Skin Assessment: Reviewed RN Assessment  Last BM:  no documented BM  Height:   Ht Readings from Last 1 Encounters:  11/06/2019 5\' 4"  (1.626 m)    Weight:   Wt Readings from Last 1 Encounters:  11/11/19 (!) 199.6 kg    Ideal Body Weight:  54.5 kg  BMI:  Body mass index is 75.53 kg/m.  Estimated Nutritional Needs:   Kcal:  5277-8242  Protein:  109-136 grams  Fluid:  >/= 1.4 L    Gaynell Face, MS, RD, LDN Inpatient Clinical Dietitian Please see AMiON for contact information.

## 2019-11-11 NOTE — Progress Notes (Signed)
[facility] Progress Note Patient Name: Kara Vincent DOB: December 01, 1965 MRN: [id number]   Date of Service  11/11/2019  HPI/Events of Note  Tonic-clonic extremity movements suspicious for seizures.  eICU Interventions  Stat head CT r/o ICH, cEEG ordered for the a.m., Keppra 1000 mg Q 12 hours started, PRN versed for seizure-like activities.        [name] [name] 11/11/2019, 12:18 AM

## 2019-11-11 NOTE — Progress Notes (Addendum)
NAME:  Kara Vincent, MRN:  [id number], DOB:  1965-05-09, LOS: 1 ADMISSION DATE:  10/23/2019, CONSULTATION DATE: 11/19/2019 REFERRING MD: Emergency department physician, CHIEF COMPLAINT: Hypercarbic respiratory failure  Brief History   Morbid obese female with OHS OSA polypharmacy use intubated for hypercarbic respiratory failure  History of present illness   54 year old morbidly obese female who presented to emergency department today with shortness of breath.  Was found in the triage area to be very lethargic.  Plan transported back to the triage area placed on BiPAP.  He does have a history of polypharmacy..  She was on a responsive to Narcan required intubation physician pulmonary critical care called to the bedside to admit.  Past Medical History   Past Medical History:  Diagnosis Date  . Allergy   . Anxiety   . Arthritis   . Asthma   . Asthma   . CHF (congestive heart failure) ([place])   . CHF (congestive heart failure) ([place])   . Chronic abdominal pain   . Chronic kidney disease   . Chronic pain    "all over"  . 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 GERD (gastroesophageal reflux disease)   . Gout   . Hidradenitis   . HLD (hyperlipidemia) 12/14/2015  . Homelessness   . Hyperlipemia   . Hypertension   . Low back pain   . Morbid obesity ([place])    uses motor wheel chair  . MRSA (methicillin resistant Staphylococcus aureus)    states about a year ago  . On home O2    3L N/C O2 continuously  . OSA (obstructive sleep apnea)    non-compliant with CPAP  . Oxygen deficiency   . Renal insufficiency   . Sleep apnea      Significant Hospital Events   Hypercarbic respiratory failure requiring intubation 11/10/1998  Consults:    Procedures:  8/19 intubation  Significant Diagnostic Tests:  8/19 chest x-ray shows severe cardiomegaly with bilateral interstitial prominence and small right pleural effusion  8/20 CT head shows no acute abnormality  Micro  Data:  8/19 sputum Blood cultures x2 MRSA PCR screening positive  Antimicrobials:  8/19 Levaquin 8/19 vancomycin  Interim history/subjective:  Overnight: Patient exhibited tonic clonic extremity movements suspicious for seizure.  CT head was done which shows no abnormality.  EEG was ordered.  Keppra 1 g twice daily was started with as needed Versed.  Patient is seen at bedside.  She is sedated but sponsored to physical and verbal stimulation.  Patient is able to follow simple command.  Erythematous petechial rash noted diffusely on her body.  Some mild twitching of her right upper extremity noted during examination.  Objective   Blood pressure 97/71, pulse 73, temperature 98.8 F (37.1 C), temperature source Axillary, resp. rate (!) 23, height 5\' 4"  (1.626 m), weight (!) 199.6 kg, last menstrual period 03/24/2009, SpO2 91 %.    Vent Mode: PRVC FiO2 (%):  [71 %-100 %] 71 % Set Rate:  [26 bmp-28 bmp] 26 bmp Vt Set:  [430 mL-450 mL] 430 mL PEEP:  [10 cmH20] 10 cmH20 Plateau Pressure:  [23 cmH20-29 cmH20] 27 cmH20   Intake/Output Summary (Last 24 hours) at 11/11/2019 0922 Last data filed at 11/11/2019 0831 Gross per 24 hour  Intake 4751.63 ml  Output 2900 ml  Net 1851.63 ml   Filed Weights   11/09/2019 0800 11/11/19 0406  Weight: (!) 175 kg (!) 199.6 kg    Examination: Physical Exam  Constitutional:      General: She is not in acute distress.    Appearance: She is obese.     Comments: Sedated but responsive to physical and verbal stimulation  HENT:     Head: Normocephalic.     Mouth/Throat:     Comments: ET tube in place Eyes:     General: No scleral icterus.       Right eye: No discharge.        Left eye: No discharge.     Pupils: Pupils are equal, round, and reactive to light.  Cardiovascular:     Rate and Rhythm: Normal rate and regular rhythm.     Heart sounds: No murmur heard.      Comments: Distant heart sound Pulmonary:     Effort: Pulmonary effort is normal.   Abdominal:     General: Bowel sounds are normal.     Palpations: Abdomen is soft.  Musculoskeletal:     Right lower leg: No edema.     Left lower leg: No edema.     Comments: Mild twitching of right upper extremity noted  Skin:    General: Skin is warm.     Findings: Rash (Diffuse erythematous petechial rash) present.  Neurological:     Comments: Sedated but responsive to physical and verbal stimulation Able to follow simple commands     Resolved Hospital Problem list     Assessment & Plan:  Acute on chronic hypercarbic respiratory failure with possible pneumonia along with possible CHF volume overload Suspected OSA/OHS History of asthma History of MRSA cellulitis Hypotension Suspected seizure History of diastolic congestive heart failure Suspecting community-acquired pneumonia Chronic renal insufficiency  Plan:  Acute on chronic hypercarbic respiratory failure Suspected community-acquired pneumonia OSA/OHS Diastolic heart failure-Echo normal 06/20/2019 History of asthma Underlying cause include possible pneumonia with CHF volume overload and OSA/OHS.  Patient is on 3 L of oxygen at home.  Per chart review she has misplaced her CPAP and has not been using it in June.  Continue ventilator support.   -PRVC FiO2 70%/PEEP of 10/rate 26/TV 430/MV 11 -Continue ventilator support.  Wean as tolerated. -VAP protocol.  Lung protective strategy -Sedation with propofol and fentanyl as needed.  Wean as tolerated -Repeat chest x-ray in a.m. -Antibiotics with Levaquin (1/5) for possible pneumonia even though proCal 0.47 -Vancomycin was added for positive MRSA. -Pending strep pneumo antigen -Pending sputum culture -Duo nebs and Solu-Medrol 60 mg twice daily for underlying asthma.   Cellulitis history of MRSA Patient has history of bedbugs -Add vancomycin to pharmaceutical regimen   Hypotension Patient received 1 L bolus of normal saline -Levophed 2 mcg/kg.  Wean as  tolerated   Suspected seizure CT head did not show any abnormality.  EEG did not show any seizure changes however it is a technically difficult study.  We will repeat EEG tomorrow.  Continue Keppra for now -Continue Keppra 1 g twice daily -Versed as needed for seizure activity   Chronic renal insufficiency Baseline creatinine 1.5.  AKI likely secondary to prerenal.  Check FeNa and urine culture. -Creatinine 1.83 - 1.97 -Trend creatinine -BMP daily -Avoid nephrotoxic medications   History of congestive heart failure Last 2D echo done in 2021 showed difficult evaluation due to body habitus.  EF was 60%. Diuresis as able.  Patient does not appear volume overloaded on exam.  Continue to monitor fluid status   Best practice:  Diet: Start tube feed Pain/Anxiety/Delirium protocol (if indicated): Sedation protocol VAP protocol (if indicated):  In place DVT prophylaxis: Heparin GI prophylaxis: PPI Glucose control: Sliding scale insulin protocol Mobility: Bedrest Code Status: Full Family Communication: We will attempt update family Disposition: ICU for ventilator management  Critical care time: 25 min     Gaylan Gerold, DO Internal Medicine Residency My pager: [phone]

## 2019-11-11 NOTE — Progress Notes (Signed)
[facility] Progress Note Patient Name: Kara Vincent DOB: 1965-11-17 MRN: [id number]   Date of Service  11/11/2019  HPI/Events of Note  Hypoxia - Sats dropped to 85%. Nursing reports pink frothy sputum. CVP = 17. I/O positive 2867 since admission.   eICU Interventions  Lasix 40 mg already ordered by ground team. I agree with their management plan.      Intervention Category Major Interventions: Hypoxemia - evaluation and management  Shahla Betsill Eugene 11/11/2019, 8:40 PM

## 2019-11-11 NOTE — TOC Initial Note (Signed)
Transition of Care [facility]) - Initial/Assessment Note    Patient Details  Name: Kara Vincent MRN: [id number] Date of Birth: 1966/01/25  Transition of Care [facility]) CM/SW Contact:    Verdell Carmine, RN Phone Number: 11/11/2019, 9:42 AM  Clinical Narrative:                 Patient admitted with hyprecapnic respiratory failure, siezure like activity. Has not been going to PCP appointments, has been walking in when needed. Has CPAP , may be non compliant. May need Trilogy.  Will inquire with MD's regarding this early in hospitalization. Will follow for needs  Expected Discharge Plan: [place] Barriers to Discharge: Continued Medical Work up   Patient Goals and CMS Choice        Expected Discharge Plan and Services Expected Discharge Plan: [place] In-house Referral: Clinical Social Work Discharge Planning Services: CM Consult                                          Prior Living Arrangements/Services   Lives with:: Significant Other Patient language and need for interpreter reviewed:: Yes        Need for Family Participation in Patient Care: Yes (Comment) Care giver support system in place?: Yes (comment)   Criminal Activity/Legal Involvement Pertinent to Current Situation/Hospitalization: No - Comment as needed  Activities of Daily Living      Permission Sought/Granted                  Emotional Assessment   Attitude/Demeanor/Rapport: Intubated (Following Commands or Not Following Commands)   Orientation: : Fluctuating Orientation (Suspected and/or reported [place]) Alcohol / Substance Use: Not Applicable Psych Involvement: No (comment)  Admission diagnosis:  SOB (shortness of breath) [R06.02] Acute respiratory failure with hypercapnia (HCC) [J96.02] Acute on chronic respiratory failure (HCC) [J96.20] Acute on chronic respiratory failure with hypercapnia ([place]) [J96.22] Patient Active Problem List    Diagnosis Date Noted  . Acute respiratory failure with hypercapnia ([place]) 10/24/2019  . Acute on chronic respiratory failure ([place]) 11/11/2019  . Bedbug bite with infection 08/10/2019  . Candidal skin infection 08/10/2019  . Chronic obstructive pulmonary disease with acute lower respiratory infection ([place]) 07/20/2019  . Cellulitis of both lower extremities 06/14/2019  . Cellulitis 06/14/2019  . BMI 60.0-69.9, adult ([place]) 12/01/2017  . Primary osteoarthritis of both knees 12/01/2017  . Anxiety 12/01/2017  . Impaired mobility 12/01/2017  . Hyperkalemia 07/10/2017  . Hypoxemia 01/22/2017  . Acute pulmonary edema ([place]) 12/14/2015  . Essential hypertension 12/14/2015  . HLD (hyperlipidemia) 12/14/2015  . GERD (gastroesophageal reflux disease) 12/14/2015  . Gout 12/14/2015  . Diastolic CHF, acute on chronic (HCC) 12/14/2015  . Darier's disease   . Leukocytosis   . Right-sided heart failure ([place]) 11/20/2014  . OSA (obstructive sleep apnea)   . CKD (chronic kidney disease) stage 3, GFR 30-59 ml/min (HCC) 11/17/2014  . Venous stasis 11/16/2014  . Acute on chronic respiratory failure with hypoxia and hypercapnia ([place]) 11/16/2014  . Chronic pain 08/24/2011  . Obesity hypoventilation syndrome ([place]) 08/24/2011   PCP:  Ladell Pier, MD Pharmacy:   [place], [place] [address] Phone: [phone] Fax: [phone]  [place], [place]  [place] [address] Phone: [phone] Fax: [phone]     Social Determinants of Health (SDOH) Interventions    Readmission Risk Interventions Readmission Risk Prevention Plan 08/12/2019  Transportation Screening Complete  PCP or Specialist Appt within 3-5 Days Complete  HRI or [place] Complete  Social Work Consult for [place] Planning/Counseling Complete  Palliative Care  Screening Not Applicable  Medication Review Press photographer) Complete  Some recent data might be hidden

## 2019-11-11 NOTE — Progress Notes (Signed)
EEG complete - results pending.  Unable to apply the o2, p8,and p4 electrodes due to unable to turn pt's head

## 2019-11-12 ENCOUNTER — Inpatient Hospital Stay (HOSPITAL_COMMUNITY): Payer: Medicaid Other

## 2019-11-12 DIAGNOSIS — J9621 Acute and chronic respiratory failure with hypoxia: Secondary | ICD-10-CM

## 2019-11-12 DIAGNOSIS — G9341 Metabolic encephalopathy: Secondary | ICD-10-CM

## 2019-11-12 DIAGNOSIS — J181 Lobar pneumonia, unspecified organism: Secondary | ICD-10-CM

## 2019-11-12 LAB — POCT I-STAT 7, (LYTES, BLD GAS, ICA,H+H)
Acid-Base Excess: 13 mmol/L — ABNORMAL HIGH (ref 0.0–2.0)
Acid-Base Excess: 11 mmol/L — ABNORMAL HIGH (ref 0.0–2.0)
Bicarbonate: 39.7 mmol/L — ABNORMAL HIGH (ref 20.0–28.0)
Bicarbonate: 37.3 mmol/L — ABNORMAL HIGH (ref 20.0–28.0)
Calcium, Ion: 1 mmol/L — ABNORMAL LOW (ref 1.15–1.40)
Calcium, Ion: 1 mmol/L — ABNORMAL LOW (ref 1.15–1.40)
HCT: 32 % — ABNORMAL LOW (ref 36.0–46.0)
HCT: 32 % — ABNORMAL LOW (ref 36.0–46.0)
Hemoglobin: 10.9 g/dL — ABNORMAL LOW (ref 12.0–15.0)
Hemoglobin: 10.9 g/dL — ABNORMAL LOW (ref 12.0–15.0)
O2 Saturation: 51 %
O2 Saturation: 91 %
Patient temperature: 37.2
Patient temperature: 98.5
Potassium: 4.3 mmol/L (ref 3.5–5.1)
Potassium: 4.3 mmol/L (ref 3.5–5.1)
Sodium: 142 mmol/L (ref 135–145)
Sodium: 142 mmol/L (ref 135–145)
TCO2: 42 mmol/L — ABNORMAL HIGH (ref 22–32)
TCO2: 39 mmol/L — ABNORMAL HIGH (ref 22–32)
pCO2 arterial: 65.1 mmHg (ref 32.0–48.0)
pCO2 arterial: 54.7 mmHg — ABNORMAL HIGH (ref 32.0–48.0)
pH, Arterial: 7.394 (ref 7.350–7.450)
pH, Arterial: 7.442 (ref 7.350–7.450)
pO2, Arterial: 29 mmHg — CL (ref 83.0–108.0)
pO2, Arterial: 60 mmHg — ABNORMAL LOW (ref 83.0–108.0)

## 2019-11-12 LAB — CBC
HCT: 35.3 % — ABNORMAL LOW (ref 36.0–46.0)
Hemoglobin: 10.1 g/dL — ABNORMAL LOW (ref 12.0–15.0)
MCH: 26.9 pg (ref 26.0–34.0)
MCHC: 28.6 g/dL — ABNORMAL LOW (ref 30.0–36.0)
MCV: 93.9 fL (ref 80.0–100.0)
Platelets: 217 10*3/uL (ref 150–400)
RBC: 3.76 MIL/uL — ABNORMAL LOW (ref 3.87–5.11)
RDW: 17.3 % — ABNORMAL HIGH (ref 11.5–15.5)
WBC: 10.4 10*3/uL (ref 4.0–10.5)
nRBC: 0 % (ref 0.0–0.2)

## 2019-11-12 LAB — BASIC METABOLIC PANEL
Anion gap: 13 (ref 5–15)
BUN: 40 mg/dL — ABNORMAL HIGH (ref 6–20)
CO2: 32 mmol/L (ref 22–32)
Calcium: 7.7 mg/dL — ABNORMAL LOW (ref 8.9–10.3)
Chloride: 97 mmol/L — ABNORMAL LOW (ref 98–111)
Creatinine, Ser: 1.88 mg/dL — ABNORMAL HIGH (ref 0.44–1.00)
GFR calc Af Amer: 34 mL/min — ABNORMAL LOW (ref >60)
GFR calc non Af Amer: 30 mL/min — ABNORMAL LOW (ref >60)
Glucose, Bld: 152 mg/dL — ABNORMAL HIGH (ref 70–99)
Potassium: 3.9 mmol/L (ref 3.5–5.1)
Sodium: 142 mmol/L (ref 135–145)

## 2019-11-12 LAB — URINE CULTURE: Culture: NO GROWTH

## 2019-11-12 LAB — MAGNESIUM
Magnesium: 2.2 mg/dL (ref 1.7–2.4)
Magnesium: 2.4 mg/dL (ref 1.7–2.4)

## 2019-11-12 LAB — GLUCOSE, CAPILLARY
Glucose-Capillary: 156 mg/dL — ABNORMAL HIGH (ref 70–99)
Glucose-Capillary: 139 mg/dL — ABNORMAL HIGH (ref 70–99)
Glucose-Capillary: 119 mg/dL — ABNORMAL HIGH (ref 70–99)
Glucose-Capillary: 162 mg/dL — ABNORMAL HIGH (ref 70–99)
Glucose-Capillary: 136 mg/dL — ABNORMAL HIGH (ref 70–99)

## 2019-11-12 LAB — PHOSPHORUS
Phosphorus: 4.1 mg/dL (ref 2.5–4.6)
Phosphorus: 5.8 mg/dL — ABNORMAL HIGH (ref 2.5–4.6)

## 2019-11-12 MED ORDER — FENTANYL 2500MCG IN NS 250ML (10MCG/ML) PREMIX INFUSION
0.0000 ug/h | INTRAVENOUS | Status: DC
  Administered 2019-11-12: 100 ug/h via INTRAVENOUS
  Administered 2019-11-12 – 2019-11-13 (×3): 200 ug/h via INTRAVENOUS
  Administered 2019-11-14: 100 ug/h via INTRAVENOUS
  Administered 2019-11-15 (×4): 300 ug/h via INTRAVENOUS
  Filled 2019-11-12 (×8): qty 250

## 2019-11-12 MED ORDER — FUROSEMIDE 10 MG/ML IJ SOLN
60.0000 mg | Freq: Two times a day (BID) | INTRAMUSCULAR | Status: DC
  Administered 2019-11-12 (×2): 60 mg via INTRAVENOUS
  Filled 2019-11-12 (×2): qty 6

## 2019-11-12 MED ORDER — VECURONIUM BROMIDE 10 MG IV SOLR
10.0000 mg | Freq: Once | INTRAVENOUS | Status: AC
  Administered 2019-11-12: 10 mg via INTRAVENOUS
  Filled 2019-11-12: qty 10

## 2019-11-12 MED ORDER — METHYLPREDNISOLONE SODIUM SUCC 125 MG IJ SOLR
40.0000 mg | Freq: Every day | INTRAMUSCULAR | Status: DC
  Administered 2019-11-12: 40 mg via INTRAVENOUS
  Filled 2019-11-12: qty 2

## 2019-11-12 NOTE — Progress Notes (Signed)
EEG complete - results pending.Per Doctor Currie Paris changing EEG to 61 -119 minutes recording . Stopping Propofol at start of EEG today

## 2019-11-12 NOTE — Progress Notes (Signed)
ABG eLink Physician-Brief Progress Note Patient Name: Kara Vincent DOB: 1965/10/16 MRN: [id number]   Date of Service  11/12/2019  HPI/Events of Note  ABG on 100%/PRVC 20/TV 430/P 16 = 7.442/54.7/60  eICU Interventions  Plan: 1. Continue present respiratory management.  2. Hold off on Nitric Oxide for now.      Intervention Category Major Interventions: Hypoxemia - evaluation and management;Respiratory failure - evaluation and management  Loreda Silverio Cornelia Copa 11/12/2019, 8:20 PM

## 2019-11-12 NOTE — Progress Notes (Signed)
Critical ABG results given to Dr. Tacy Learn. Verbal order received to draw a repeat ABG at 2000. If no improvement verbal order received to place pt on 20PPM iNO. RT will continue to monitor and notify charge therapist.

## 2019-11-12 NOTE — Progress Notes (Signed)
NAME:  Kara Vincent, MRN:  [id number], DOB:  15-Jun-1965, LOS: 2 ADMISSION DATE:  11/02/2019, CONSULTATION DATE: 11/12/2019 REFERRING MD: Emergency department physician, CHIEF COMPLAINT: Hypercarbic respiratory failure  Brief History   Morbid obese female with OHS OSA polypharmacy use intubated for acute on chronic hypercarbic respiratory failure due to community-acquired pneumonia and acute diastolic CHF exacerbation  Past Medical History   Past Medical History:  Diagnosis Date  . Allergy   . Anxiety   . Arthritis   . Asthma   . Asthma   . CHF (congestive heart failure) ([place])   . CHF (congestive heart failure) ([place])   . Chronic abdominal pain   . Chronic kidney disease   . Chronic pain    "all over"  . 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 GERD (gastroesophageal reflux disease)   . Gout   . Hidradenitis   . HLD (hyperlipidemia) 12/14/2015  . Homelessness   . Hyperlipemia   . Hypertension   . Low back pain   . Morbid obesity ([place])    uses motor wheel chair  . MRSA (methicillin resistant Staphylococcus aureus)    states about a year ago  . On home O2    3L N/C O2 continuously  . OSA (obstructive sleep apnea)    non-compliant with CPAP  . Oxygen deficiency   . Renal insufficiency   . Sleep apnea      Significant Hospital Events   8/19 intubation/mechanical ventilation  Consults:  PCCM Neurology  Procedures:    Significant Diagnostic Tests:  8/19 chest x-ray shows severe cardiomegaly with bilateral interstitial prominence and small right pleural effusion  8/20 CT head shows no acute abnormality  8/20 -8/21 EEG: No seizures Micro Data:  8/19 respiratory culture >> 8/9 blood cultures x2 >> MRSA PCR: positive  Antimicrobials:  Levaquin 8/19 Vancomycin 8/19 >> 8/21  Interim history/subjective:  Patient continued to require mechanical ventilation with high vent setting, remains hypoxic and hypercapnic Repeat EEG was negative for  seizures  Objective   Blood pressure 113/83, pulse (!) 51, temperature (!) 97.5 F (36.4 C), temperature source Axillary, resp. rate (!) 21, height 5\' 4"  (1.626 m), weight (!) 203.7 kg, last menstrual period 03/24/2009, SpO2 90 %. CVP:  [17 mmHg] 17 mmHg  Vent Mode: PRVC FiO2 (%):  [70 %-100 %] 100 % Set Rate:  [26 bmp] 26 bmp Vt Set:  [430 mL] 430 mL PEEP:  [10 cmH20-16 cmH20] 16 cmH20 Plateau Pressure:  [20 cmH20-30 cmH20] 30 cmH20   Intake/Output Summary (Last 24 hours) at 11/12/2019 1538 Last data filed at 11/12/2019 1200 Gross per 24 hour  Intake 1953.71 ml  Output 1785 ml  Net 168.71 ml   Filed Weights   10/28/2019 0800 11/11/19 0406 11/12/19 0410  Weight: (!) 175 kg (!) 199.6 kg (!) 203.7 kg     Physical exam: General: Chronically ill-appearing morbidly obese female, orally intubated HEAENT: [place]/AT, eyes anicteric.  ETT and OGT in place Neuro: Sedated, not following commands.  Eyes are closed.  Pupils 3 mm bilateral reactive to light Chest: Bilateral basal crackles, no wheezes or rhonchi Heart: Regular rate and rhythm, no murmurs or gallops Abdomen: Soft, nontender, nondistended, bowel sounds present Skin: No rash  Resolved Hospital Problem list     Assessment & Plan:  Acute on chronic hypoxic/hypercarbic respiratory failure  Acute diastolic congestive heart failure Community-acquired pneumonia Seizures were ruled out CKD stage III Acute metabolic/toxic encephalopathy Morbid obesity  Patient  remained critically ill and still hypoxic and chronically hypercapnic, she is requiring 100% FiO2 with O2 sat of 89% and PaO2 of 59 Continue to t adjust vent setting to improve hypoxemia and correct hypercapnia Continue aggressive IV diuresis Monitor intake and output MRSA PCR was negative Vancomycin was discontinued Continue levofloxacin Continue low-dose propofol and fentanyl with RASS goal of -1/-2 Repeat EEG was done, seizures were ruled out, consistent with metabolic  encephalopathy Monitor serum creatinine   Best practice:  Diet: Tube feed Pain/Anxiety/Delirium protocol (if indicated): Sedation protocol VAP protocol (if indicated): Yes DVT prophylaxis: Heparin GI prophylaxis: PPI Glucose control: Sliding scale insulin protocol Mobility: Bedrest Code Status: Full Family Communication: Patient significant other was updated about her condition at bedside  Disposition: ICU  Total critical care time: 48 minutes  Performed by: [place] care time was exclusive of separately billable procedures and treating other patients.   Critical care was necessary to treat or prevent imminent or life-threatening deterioration.   Critical care was time spent personally by me on the following activities: development of treatment plan with patient and/or surrogate as well as nursing, discussions with consultants, evaluation of patient's response to treatment, examination of patient, obtaining history from patient or surrogate, ordering and performing treatments and interventions, ordering and review of laboratory studies, ordering and review of radiographic studies, pulse oximetry and re-evaluation of patient's condition.   Jacky Kindle MD Critical care physician [facility]  Pager: [phone] Mobile: [phone]

## 2019-11-12 NOTE — Procedures (Signed)
Patient Name: Kara Vincent  MRN: [id number]  Epilepsy Attending: Lora Havens  Referring Physician/Provider: Dr Gaylan Gerold Date: 11/12/2019 Duration: 2 hour 5 mins  Patient history: 54 year old morbidly obese female who was admitted for respiratory distress, currently intubated and then noted to have seizure-like activity.  EEG evaluate for seizures.  Level of alertness: lethargic  AEDs during EEG study: Propofol, Keppra  Technical aspects: This EEG study was done with scalp electrodes positioned according to the 10-20 International system of electrode placement. Electrical activity was acquired at a sampling rate of 500Hz  and reviewed with a high frequency filter of 70Hz  and a low frequency filter of 1Hz . EEG data were recorded continuously and digitally stored.   Description: EEG initially showed continuous generalized rhythmic sharply contoured 2 to 3 Hz delta slowing admixed with 13 to 15 Hz generalized beta activity. Propofol was stopped after which eeg showed intermittent generalized rhythmic sharply contoured 2-3Hz  delta activity which can be on the ictal-interictal continuum. There is also intermittent generalized polymorphic mixed frequencies with predominantly  5-9Hz  theta-alpha activity. Hyperventilation and photic stimulation were not performed.     ABNORMALITY -Intermittent rhythmic delta slow, generalized  IMPRESSION: This study initially showed severe diffuse encephalopathy, nonspecific to etiology but likely related to sedation.  Once propofol was stopped, eeg showed intermittent rhythmic sharply contoured delta activity which is on the ictal-intrictal continuum with low propensity for seizures. Comparing the eeg before and after stopping propofol, this pattern is favored to be most likely secondary to sedation. No seizures or definite epileptiform discharges were seen throughout the recording.  Kara Vincent Kara Vincent

## 2019-11-13 DIAGNOSIS — J9602 Acute respiratory failure with hypercapnia: Secondary | ICD-10-CM

## 2019-11-13 DIAGNOSIS — N179 Acute kidney failure, unspecified: Secondary | ICD-10-CM

## 2019-11-13 LAB — POCT I-STAT 7, (LYTES, BLD GAS, ICA,H+H)
Acid-Base Excess: 14 mmol/L — ABNORMAL HIGH (ref 0.0–2.0)
Acid-Base Excess: 12 mmol/L — ABNORMAL HIGH (ref 0.0–2.0)
Bicarbonate: 39.1 mmol/L — ABNORMAL HIGH (ref 20.0–28.0)
Bicarbonate: 38.3 mmol/L — ABNORMAL HIGH (ref 20.0–28.0)
Calcium, Ion: 0.97 mmol/L — ABNORMAL LOW (ref 1.15–1.40)
Calcium, Ion: 1 mmol/L — ABNORMAL LOW (ref 1.15–1.40)
HCT: 32 % — ABNORMAL LOW (ref 36.0–46.0)
HCT: 34 % — ABNORMAL LOW (ref 36.0–46.0)
Hemoglobin: 10.9 g/dL — ABNORMAL LOW (ref 12.0–15.0)
Hemoglobin: 11.6 g/dL — ABNORMAL LOW (ref 12.0–15.0)
O2 Saturation: 98 %
O2 Saturation: 93 %
Patient temperature: 98.7
Patient temperature: 99
Potassium: 4 mmol/L (ref 3.5–5.1)
Potassium: 3.9 mmol/L (ref 3.5–5.1)
Sodium: 142 mmol/L (ref 135–145)
Sodium: 141 mmol/L (ref 135–145)
TCO2: 41 mmol/L — ABNORMAL HIGH (ref 22–32)
TCO2: 40 mmol/L — ABNORMAL HIGH (ref 22–32)
pCO2 arterial: 51.6 mmHg — ABNORMAL HIGH (ref 32.0–48.0)
pCO2 arterial: 59.6 mmHg — ABNORMAL HIGH (ref 32.0–48.0)
pH, Arterial: 7.488 — ABNORMAL HIGH (ref 7.350–7.450)
pH, Arterial: 7.417 (ref 7.350–7.450)
pO2, Arterial: 97 mmHg (ref 83.0–108.0)
pO2, Arterial: 68 mmHg — ABNORMAL LOW (ref 83.0–108.0)

## 2019-11-13 LAB — GLUCOSE, CAPILLARY
Glucose-Capillary: 104 mg/dL — ABNORMAL HIGH (ref 70–99)
Glucose-Capillary: 106 mg/dL — ABNORMAL HIGH (ref 70–99)
Glucose-Capillary: 110 mg/dL — ABNORMAL HIGH (ref 70–99)
Glucose-Capillary: 122 mg/dL — ABNORMAL HIGH (ref 70–99)
Glucose-Capillary: 129 mg/dL — ABNORMAL HIGH (ref 70–99)
Glucose-Capillary: 129 mg/dL — ABNORMAL HIGH (ref 70–99)
Glucose-Capillary: 153 mg/dL — ABNORMAL HIGH (ref 70–99)

## 2019-11-13 LAB — BASIC METABOLIC PANEL
Anion gap: 11 (ref 5–15)
BUN: 56 mg/dL — ABNORMAL HIGH (ref 6–20)
CO2: 37 mmol/L — ABNORMAL HIGH (ref 22–32)
Calcium: 7.6 mg/dL — ABNORMAL LOW (ref 8.9–10.3)
Chloride: 97 mmol/L — ABNORMAL LOW (ref 98–111)
Creatinine, Ser: 2.14 mg/dL — ABNORMAL HIGH (ref 0.44–1.00)
GFR calc Af Amer: 29 mL/min — ABNORMAL LOW (ref >60)
GFR calc non Af Amer: 25 mL/min — ABNORMAL LOW (ref >60)
Glucose, Bld: 104 mg/dL — ABNORMAL HIGH (ref 70–99)
Potassium: 4.1 mmol/L (ref 3.5–5.1)
Sodium: 145 mmol/L (ref 135–145)

## 2019-11-13 LAB — CBC
HCT: 35.1 % — ABNORMAL LOW (ref 36.0–46.0)
Hemoglobin: 9.6 g/dL — ABNORMAL LOW (ref 12.0–15.0)
MCH: 26.2 pg (ref 26.0–34.0)
MCHC: 27.4 g/dL — ABNORMAL LOW (ref 30.0–36.0)
MCV: 95.6 fL (ref 80.0–100.0)
Platelets: 198 10*3/uL (ref 150–400)
RBC: 3.67 MIL/uL — ABNORMAL LOW (ref 3.87–5.11)
RDW: 17.5 % — ABNORMAL HIGH (ref 11.5–15.5)
WBC: 11.4 10*3/uL — ABNORMAL HIGH (ref 4.0–10.5)
nRBC: 0 % (ref 0.0–0.2)

## 2019-11-13 LAB — PHOSPHORUS: Phosphorus: 5.9 mg/dL — ABNORMAL HIGH (ref 2.5–4.6)

## 2019-11-13 LAB — TRIGLYCERIDES: Triglycerides: 248 mg/dL — ABNORMAL HIGH (ref <150)

## 2019-11-13 LAB — MAGNESIUM: Magnesium: 2.3 mg/dL (ref 1.7–2.4)

## 2019-11-13 MED ORDER — FUROSEMIDE 10 MG/ML IJ SOLN
40.0000 mg | Freq: Two times a day (BID) | INTRAMUSCULAR | Status: DC
  Administered 2019-11-13 – 2019-11-15 (×5): 40 mg via INTRAVENOUS
  Filled 2019-11-13 (×5): qty 4

## 2019-11-13 MED ORDER — METHYLPREDNISOLONE SODIUM SUCC 40 MG IJ SOLR
20.0000 mg | Freq: Every day | INTRAMUSCULAR | Status: DC
  Administered 2019-11-13 – 2019-11-15 (×3): 20 mg via INTRAVENOUS
  Filled 2019-11-13 (×4): qty 0.5

## 2019-11-13 MED ORDER — ACETAZOLAMIDE ER 500 MG PO CP12
500.0000 mg | ORAL_CAPSULE | Freq: Two times a day (BID) | ORAL | Status: DC
  Administered 2019-11-13 – 2019-11-14 (×3): 500 mg via ORAL
  Filled 2019-11-13 (×4): qty 1

## 2019-11-13 NOTE — Procedures (Signed)
Arterial Catheter Insertion Procedure Note  FRANCISCA LANGENDERFER  [id number]  28-Jul-1965  Date:11/13/19  Time:4:33 PM    Provider Performing: Jacky Kindle    Procedure: Insertion of Arterial Line [phone]) with US guidance ([id number])   Indication(s) Blood pressure monitoring and/or need for frequent ABGs  Consent Unable to obtain consent due to emergent nature of procedure.  Anesthesia None   Time Out Verified patient identification, verified procedure, site/side was marked, verified correct patient position, special equipment/implants available, medications/allergies/relevant history reviewed, required imaging and test results available.   Sterile Technique Maximal sterile technique including full sterile barrier drape, hand hygiene, sterile gown, sterile gloves, mask, hair covering, sterile ultrasound probe cover (if used).   Procedure Description Area of catheter insertion was cleaned with chlorhexidine and draped in sterile fashion. With real-time ultrasound guidance an arterial catheter was placed into the left Axillary artery.  Appropriate arterial tracings confirmed on monitor.     Complications/Tolerance None; patient tolerated the procedure well.   EBL Minimal   Specimen(s) None

## 2019-11-13 NOTE — Progress Notes (Signed)
RT note- recruitment maneuver performed for sp02 88%.

## 2019-11-13 NOTE — Progress Notes (Signed)
NAME:  Kara Vincent, MRN:  [id number], DOB:  [birth year], LOS: 3 ADMISSION DATE:  11/04/2019, CONSULTATION DATE: 11/02/2019 REFERRING MD: Emergency department physician, CHIEF COMPLAINT: Hypercarbic respiratory failure  Brief History   Morbid obese female with OHS OSA polypharmacy use intubated for acute on chronic hypoxic/hypercarbic respiratory failure due to community-acquired pneumonia and acute diastolic CHF exacerbation  Past Medical History   Past Medical History:  Diagnosis Date   Allergy    Anxiety    Arthritis    Asthma    Asthma    CHF (congestive heart failure) (HCC)    CHF (congestive heart failure) (HCC)    Chronic abdominal pain    Chronic kidney disease    Chronic pain    "all over"   Darier disease    chronic, 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   GERD (gastroesophageal reflux disease)    Gout    Hidradenitis    HLD (hyperlipidemia) 12/14/2015   Homelessness    Hyperlipemia    Hypertension    Low back pain    Morbid obesity ([place])    uses motor wheel chair   MRSA (methicillin resistant Staphylococcus aureus)    states about a year ago   On home O2    3L N/C O2 continuously   OSA (obstructive sleep apnea)    non-compliant with CPAP   Oxygen deficiency    Renal insufficiency    Sleep apnea      Significant Hospital Events   8/19 intubation/mechanical ventilation  Consults:  PCCM Neurology  Procedures:    Significant Diagnostic Tests:  8/19 chest x-ray shows severe cardiomegaly with bilateral interstitial prominence and small right pleural effusion  8/20 CT head shows no acute abnormality  8/20 -8/21 EEG: No seizures Micro Data:  8/19 respiratory culture >> 8/9 blood cultures x2 >> MRSA PCR: positive  Antimicrobials:  Levaquin 8/19 Vancomycin 8/19 >> 8/21  Interim history/subjective:  Patient's blood exchange is slightly better today, still on 100% FiO2 and PEEP of 14 with PaO2 in 90s. She diuresed very well, net  negative of 1.8 L  Objective   Blood pressure 100/63, pulse (!) 52, temperature 99 F (37.2 C), temperature source Axillary, resp. rate 20, height 5\' 4"  (1.626 m), weight (!) 203.7 kg, last menstrual period 03/24/2009, SpO2 95 %.    Vent Mode: PRVC FiO2 (%):  [100 %] 100 % Set Rate:  [20 bmp] 20 bmp Vt Set:  [430 mL] 430 mL PEEP:  [16 cmH20] 16 cmH20 Plateau Pressure:  [24 cmH20-28 cmH20] 27 cmH20   Intake/Output Summary (Last 24 hours) at 11/13/2019 0957 Last data filed at 11/13/2019 0600 Gross per 24 hour  Intake 1728.94 ml  Output 3575 ml  Net -1846.06 ml   Filed Weights   11/12/2019 0800 11/11/19 0406 11/12/19 0410  Weight: (!) 175 kg (!) 199.6 kg (!) 203.7 kg     Physical exam: General: Chronically ill-appearing morbidly obese female, orally intubated HEAENT: [place]/AT, eyes anicteric.  ETT and OGT in place Neuro: Lightly sedated, not following commands.  Eyes are closed.  Pupils 3 mm bilateral reactive to light Chest: Minimal basal crackles bilaterally, no wheezes or rhonchi Heart: Regular rate and rhythm, no murmurs or gallops Abdomen: Soft, nontender, nondistended, bowel sounds present Skin: Excoriated skin under the breast and in groin  Resolved Hospital Problem list     Assessment & Plan:  Acute on chronic hypoxic/hypercarbic respiratory failure  Acute diastolic congestive heart failure Severe ARDS due to community-acquired pneumonia Acute kidney  injury on CKD stage III Acute metabolic/toxic encephalopathy Normocytic anemia Morbid obesity  Patient remained critically ill, continue to require high ventilatory setting to maintain O2 sat in low 90s, still on 100% FiO2, PEEP 14 Patient with P/F ratio of 97 but unable to prone her due to morbid obesity Continue  IV diuresis with Lasix, acetazolamide was added to prevent high bicarbonate  monitor intake and output Continue levofloxacin Continue low-dose propofol and fentanyl with RASS goal of -1/-2 Patient's hemoglobin  is around 9, monitor H&H Started on trickle tube feed, continue with target goal per dietitian. Repeat BMP in the morning   Best practice:  Diet: Tube feed Pain/Anxiety/Delirium protocol (if indicated): Sedation protocol VAP protocol (if indicated): Yes DVT prophylaxis: Heparin GI prophylaxis: PPI Glucose control: Sliding scale insulin protocol Mobility: Bedrest Code Status: Full Family Communication: Patient significant other was updated about her condition at bedside  Disposition: ICU  Total critical care time: 44 minutes  Performed by: [place] care time was exclusive of separately billable procedures and treating other patients.   Critical care was necessary to treat or prevent imminent or life-threatening deterioration.   Critical care was time spent personally by me on the following activities: development of treatment plan with patient and/or surrogate as well as nursing, discussions with consultants, evaluation of patient's response to treatment, examination of patient, obtaining history from patient or surrogate, ordering and performing treatments and interventions, ordering and review of laboratory studies, ordering and review of radiographic studies, pulse oximetry and re-evaluation of patient's condition.   Jacky Kindle MD Critical care physician [facility]  Pager: [phone] Mobile: [phone]

## 2019-11-13 NOTE — Progress Notes (Signed)
Pharmacy Antibiotic Note  Kara Vincent is a 54 y.o. female admitted on 11/21/2019 with community acquired pneumonia and possible cellulitis with scabs all over skin on physical exam. Pharmacy has been consulted for vancomycin dosing. Pt is afebrile with increased WBC at 14.7, PCT 0.47, and LA 2.3. Pt QTc on 8/19 was 427. Notably, Pt has numerous documented severe allergies to penicillins, doxycycline, and azithromycin (anaphylaxis/hives/rash).   Vancomycin d/c'd. Day 4 of Levaquin therapy  Plan: Continue levofloxacin 750mg  IV every 24 hours Will continue to monitor s/sx of infection, clinical progress, renal function, and micro results   Height: 5\' 4"  (162.6 cm) Weight: (!) 203.7 kg (449 lb) IBW/kg (Calculated) : 54.7  Temp (24hrs), Avg:98.5 F (36.9 C), Min:97.5 F (36.4 C), Max:99 F (37.2 C)  Recent Labs  Lab 11/04/2019 0059 11/06/2019 0236 11/19/2019 1200 11/11/19 0539 11/12/19 0311 11/13/19 0537  WBC 16.1*  --  14.7* 13.3* 10.4 11.4*  CREATININE 1.79*  --  1.83* 1.97* 1.88* 2.14*  LATICACIDVEN  --  1.3 2.3*  --   --   --     Estimated Creatinine Clearance: 53.6 mL/min (A) (by C-G formula based on SCr of 2.14 mg/dL (H)).    Allergies  Allergen Reactions  . Ibuprofen Hives, Shortness Of Breath, Palpitations and Rash    Has tolerated toradol 12/2013 inpatient  . Penicillins Anaphylaxis, Hives and Rash    Has patient had a PCN reaction causing immediate rash, facial/tongue/throat swelling, SOB or lightheadedness with hypotension: Yes Has patient had a PCN reaction causing severe rash involving mucus membranes or skin necrosis: Yes Has patient had a PCN reaction that required hospitalization Unknown Has patient had a PCN reaction occurring within the last 10 years: Unknown If all of the above answers are "NO", then may proceed with Cephalosporin use.   . Sulfa Antibiotics Hives, Shortness Of Breath, Rash and Cough  . Adhesive [Tape] Rash  . Doxycycline Hives, Swelling and  Rash  . Sulfamethoxazole-Trimethoprim Rash  . Zithromax [Azithromycin] Hives, Swelling and Rash    Antimicrobials this admission: Levofloxacin 8/19> Vancomycin 8/19>8/21  Microbiology results: 8/19 BCx: ngtd 8/19 UCx: multiple species 8/19 MRSA: positive  Thank you for allowing pharmacy to be a part of this patient's care.  Alanda Slim, PharmD, [facility] Clinical Pharmacist Please see AMION for all Pharmacists' Contact Phone Numbers 11/13/2019, 9:01 AM

## 2019-11-14 ENCOUNTER — Inpatient Hospital Stay (HOSPITAL_COMMUNITY): Payer: Medicaid Other

## 2019-11-14 LAB — POCT I-STAT 7, (LYTES, BLD GAS, ICA,H+H)
Acid-Base Excess: 12 mmol/L — ABNORMAL HIGH (ref 0.0–2.0)
Acid-Base Excess: 8 mmol/L — ABNORMAL HIGH (ref 0.0–2.0)
Acid-Base Excess: 9 mmol/L — ABNORMAL HIGH (ref 0.0–2.0)
Bicarbonate: 36.6 mmol/L — ABNORMAL HIGH (ref 20.0–28.0)
Bicarbonate: 35.7 mmol/L — ABNORMAL HIGH (ref 20.0–28.0)
Bicarbonate: 35.7 mmol/L — ABNORMAL HIGH (ref 20.0–28.0)
Calcium, Ion: 1.01 mmol/L — ABNORMAL LOW (ref 1.15–1.40)
Calcium, Ion: 1.04 mmol/L — ABNORMAL LOW (ref 1.15–1.40)
Calcium, Ion: 1.04 mmol/L — ABNORMAL LOW (ref 1.15–1.40)
HCT: 35 % — ABNORMAL LOW (ref 36.0–46.0)
HCT: 37 % (ref 36.0–46.0)
HCT: 35 % — ABNORMAL LOW (ref 36.0–46.0)
Hemoglobin: 11.9 g/dL — ABNORMAL LOW (ref 12.0–15.0)
Hemoglobin: 12.6 g/dL (ref 12.0–15.0)
Hemoglobin: 11.9 g/dL — ABNORMAL LOW (ref 12.0–15.0)
O2 Saturation: 96 %
O2 Saturation: 85 %
O2 Saturation: 91 %
Patient temperature: 98.6
Patient temperature: 37.3
Patient temperature: 100.3
Potassium: 3.6 mmol/L (ref 3.5–5.1)
Potassium: 4.1 mmol/L (ref 3.5–5.1)
Potassium: 3.6 mmol/L (ref 3.5–5.1)
Sodium: 140 mmol/L (ref 135–145)
Sodium: 141 mmol/L (ref 135–145)
Sodium: 141 mmol/L (ref 135–145)
TCO2: 38 mmol/L — ABNORMAL HIGH (ref 22–32)
TCO2: 38 mmol/L — ABNORMAL HIGH (ref 22–32)
TCO2: 37 mmol/L — ABNORMAL HIGH (ref 22–32)
pCO2 arterial: 44.7 mmHg (ref 32.0–48.0)
pCO2 arterial: 62.8 mmHg — ABNORMAL HIGH (ref 32.0–48.0)
pCO2 arterial: 61.1 mmHg — ABNORMAL HIGH (ref 32.0–48.0)
pH, Arterial: 7.521 — ABNORMAL HIGH (ref 7.350–7.450)
pH, Arterial: 7.364 (ref 7.350–7.450)
pH, Arterial: 7.379 (ref 7.350–7.450)
pO2, Arterial: 75 mmHg — ABNORMAL LOW (ref 83.0–108.0)
pO2, Arterial: 54 mmHg — ABNORMAL LOW (ref 83.0–108.0)
pO2, Arterial: 69 mmHg — ABNORMAL LOW (ref 83.0–108.0)

## 2019-11-14 LAB — CBC
HCT: 37.8 % (ref 36.0–46.0)
Hemoglobin: 10.6 g/dL — ABNORMAL LOW (ref 12.0–15.0)
MCH: 26.7 pg (ref 26.0–34.0)
MCHC: 28 g/dL — ABNORMAL LOW (ref 30.0–36.0)
MCV: 95.2 fL (ref 80.0–100.0)
Platelets: 212 10*3/uL (ref 150–400)
RBC: 3.97 MIL/uL (ref 3.87–5.11)
RDW: 17.8 % — ABNORMAL HIGH (ref 11.5–15.5)
WBC: 16.5 10*3/uL — ABNORMAL HIGH (ref 4.0–10.5)
nRBC: 0 % (ref 0.0–0.2)

## 2019-11-14 LAB — BASIC METABOLIC PANEL
Anion gap: 14 (ref 5–15)
BUN: 60 mg/dL — ABNORMAL HIGH (ref 6–20)
CO2: 34 mmol/L — ABNORMAL HIGH (ref 22–32)
Calcium: 8 mg/dL — ABNORMAL LOW (ref 8.9–10.3)
Chloride: 96 mmol/L — ABNORMAL LOW (ref 98–111)
Creatinine, Ser: 2 mg/dL — ABNORMAL HIGH (ref 0.44–1.00)
GFR calc Af Amer: 32 mL/min — ABNORMAL LOW (ref >60)
GFR calc non Af Amer: 27 mL/min — ABNORMAL LOW (ref >60)
Glucose, Bld: 106 mg/dL — ABNORMAL HIGH (ref 70–99)
Potassium: 3.7 mmol/L (ref 3.5–5.1)
Sodium: 144 mmol/L (ref 135–145)

## 2019-11-14 LAB — GLUCOSE, CAPILLARY
Glucose-Capillary: 103 mg/dL — ABNORMAL HIGH (ref 70–99)
Glucose-Capillary: 115 mg/dL — ABNORMAL HIGH (ref 70–99)
Glucose-Capillary: 126 mg/dL — ABNORMAL HIGH (ref 70–99)
Glucose-Capillary: 131 mg/dL — ABNORMAL HIGH (ref 70–99)
Glucose-Capillary: 132 mg/dL — ABNORMAL HIGH (ref 70–99)
Glucose-Capillary: 139 mg/dL — ABNORMAL HIGH (ref 70–99)

## 2019-11-14 LAB — TRIGLYCERIDES: Triglycerides: 219 mg/dL — ABNORMAL HIGH (ref <150)

## 2019-11-14 MED ORDER — PIPERACILLIN-TAZOBACTAM 3.375 G IVPB
3.3750 g | Freq: Three times a day (TID) | INTRAVENOUS | Status: DC
  Administered 2019-11-14 – 2019-11-15 (×6): 3.375 g via INTRAVENOUS
  Filled 2019-11-14 (×8): qty 50

## 2019-11-14 MED ORDER — POTASSIUM CHLORIDE 20 MEQ/15ML (10%) PO SOLN
40.0000 meq | Freq: Once | ORAL | Status: AC
  Administered 2019-11-14: 40 meq
  Filled 2019-11-14: qty 30

## 2019-11-14 MED ORDER — CALCIUM GLUCONATE-NACL 1-0.675 GM/50ML-% IV SOLN
1.0000 g | Freq: Once | INTRAVENOUS | Status: AC
  Administered 2019-11-14: 1000 mg via INTRAVENOUS
  Filled 2019-11-14: qty 50

## 2019-11-14 MED ORDER — SENNA 8.6 MG PO TABS
1.0000 | ORAL_TABLET | Freq: Two times a day (BID) | ORAL | Status: DC
  Administered 2019-11-14: 8.6 mg via ORAL
  Filled 2019-11-14: qty 1

## 2019-11-14 MED ORDER — VANCOMYCIN HCL 1750 MG/350ML IV SOLN
1750.0000 mg | INTRAVENOUS | Status: DC
  Administered 2019-11-15: 1750 mg via INTRAVENOUS
  Filled 2019-11-14 (×2): qty 350

## 2019-11-14 MED ORDER — SENNA 8.6 MG PO TABS
1.0000 | ORAL_TABLET | Freq: Two times a day (BID) | ORAL | Status: DC
  Administered 2019-11-14 – 2019-11-15 (×3): 8.6 mg
  Filled 2019-11-14 (×3): qty 1

## 2019-11-14 MED ORDER — FENTANYL BOLUS VIA INFUSION
50.0000 ug | INTRAVENOUS | Status: DC | PRN
  Administered 2019-11-14: 50 ug via INTRAVENOUS
  Filled 2019-11-14: qty 50

## 2019-11-14 MED ORDER — VANCOMYCIN HCL 1750 MG/350ML IV SOLN: 1750.0000 mg | INTRAVENOUS | Status: DC

## 2019-11-14 MED ORDER — ACETAZOLAMIDE ORAL SUSPENSION 25 MG/ML
500.0000 mg | Freq: Two times a day (BID) | ORAL | Status: AC
  Administered 2019-11-14 – 2019-11-15 (×4): 500 mg
  Filled 2019-11-14 (×8): qty 20

## 2019-11-14 MED ORDER — VANCOMYCIN HCL 10 G IV SOLR
2500.0000 mg | Freq: Once | INTRAVENOUS | Status: AC
  Administered 2019-11-14: 2500 mg via INTRAVENOUS
  Filled 2019-11-14: qty 2500

## 2019-11-14 NOTE — Progress Notes (Addendum)
Pharmacy Antibiotic Note  Kara Vincent is a 54 y.o. female admitted on 10/24/2019 initially treated for community acquired pneumonia, now with possible aspiration pneumonia. Pharmacy has been consulted for vancomycin and zosyn dosing. Pt is afebrile with increased WBC at 16.5. Pt is requiring increasing amount of vasopressors (norepinephrine at 2mcg/min currently) and increasing ventilatory support (FiO2 100% and PEEP 16).  Notably, pt does have numerous documented severe allergies to penicillins, doxycyline, and azithromycin. MD is aware and would like to proceed with above therapies.  Plan: Zosyn 3.375g IV every 8 hours for 7 days  Vancomycin 2500mg  IV once followed by Vancomycin 1750 IV every 24 hours.  Goal trough 15-20 mcg/mL.  Continue to monitor CBC, renal function and clinical course  Height: 5\' 4"  (162.6 cm) Weight: (!) 203.7 kg (449 lb) IBW/kg (Calculated) : 54.7  Temp (24hrs), Avg:99.1 F (37.3 C), Min:98.4 F (36.9 C), Max:99.6 F (37.6 C)  Recent Labs  Lab 11/20/2019 0059 10/31/2019 0236 11/22/2019 1200 11/11/19 0539 11/12/19 0311 11/13/19 0537 11/14/19 0558  WBC   < >  --  14.7* 13.3* 10.4 11.4* 16.5*  CREATININE   < >  --  1.83* 1.97* 1.88* 2.14* 2.00*  LATICACIDVEN  --  1.3 2.3*  --   --   --   --    < > = values in this interval not displayed.    Estimated Creatinine Clearance: 57.3 mL/min (A) (by C-G formula based on SCr of 2 mg/dL (H)).    Allergies  Allergen Reactions  . Ibuprofen Hives, Shortness Of Breath, Palpitations and Rash    Has tolerated toradol 12/2013 inpatient  . Penicillins Anaphylaxis, Hives and Rash    Has patient had a PCN reaction causing immediate rash, facial/tongue/throat swelling, SOB or lightheadedness with hypotension: Yes Has patient had a PCN reaction causing severe rash involving mucus membranes or skin necrosis: Yes Has patient had a PCN reaction that required hospitalization Unknown Has patient had a PCN reaction occurring  within the last 10 years: Unknown If all of the above answers are "NO", then may proceed with Cephalosporin use.   . Sulfa Antibiotics Hives, Shortness Of Breath, Rash and Cough  . Adhesive [Tape] Rash  . Doxycycline Hives, Swelling and Rash  . Sulfamethoxazole-Trimethoprim Rash  . Zithromax [Azithromycin] Hives, Swelling and Rash    Antimicrobials this admission: Levofloxacin 8/19>8/23 Vancomycin 8/19>8/21, 8/23> Zosyn 8/23>   Microbiology results: 8/19 BCx: no growth 4 days  8/19 UCx: no growth 8/19 MRSA: pos   Thank you for allowing pharmacy to be a part of this patient's care.  Carolin Guernsey  PGY1 Pharmacy 11/14/2019 8:52 AM

## 2019-11-14 NOTE — Progress Notes (Signed)
NAME:  Kara Vincent, MRN:  [id number], DOB:  15-Dec-1965, LOS: 4 ADMISSION DATE:  10/25/2019, CONSULTATION DATE: 10/26/2019 REFERRING MD: Emergency department physician, CHIEF COMPLAINT: Hypercarbic respiratory failure  Brief History   Morbid obese female with OHS OSA polypharmacy use intubated for acute on chronic hypoxic/hypercarbic respiratory failure due to community-acquired pneumonia and acute diastolic CHF exacerbation  Past Medical History   Past Medical History:  Diagnosis Date  . Allergy   . Anxiety   . Arthritis   . Asthma   . Asthma   . CHF (congestive heart failure) ([place])   . CHF (congestive heart failure) ([place])   . Chronic abdominal pain   . Chronic kidney disease   . Chronic pain    "all over"  . 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 GERD (gastroesophageal reflux disease)   . Gout   . Hidradenitis   . HLD (hyperlipidemia) 12/14/2015  . Homelessness   . Hyperlipemia   . Hypertension   . Low back pain   . Morbid obesity ([place])    uses motor wheel chair  . MRSA (methicillin resistant Staphylococcus aureus)    states about a year ago  . On home O2    3L N/C O2 continuously  . OSA (obstructive sleep apnea)    non-compliant with CPAP  . Oxygen deficiency   . Renal insufficiency   . Sleep apnea      Significant Hospital Events   8/19 intubation/mechanical ventilation  Consults:  PCCM Neurology  Procedures:    Significant Diagnostic Tests:  8/19 chest x-ray shows severe cardiomegaly with bilateral interstitial prominence and small right pleural effusion  8/20 CT head shows no acute abnormality  8/20 -8/21 EEG: No seizures Micro Data:  8/19 respiratory culture >> 8/19 blood cultures x2 >> 8/19 MRSA PCR>> positive  Antimicrobials:  Levaquin 8/19 Vancomycin 8/19 >> 8/21  Interim history/subjective:   still on 100% FiO2, PEEP increased to 16 with O2 saturations in the high 80s low 90s. She diuresed very well yesterday and was  net negative of 2.8 L ON   Objective   Blood pressure (!) 93/59, pulse 69, temperature 99.6 F (37.6 C), temperature source Oral, resp. rate 20, height 5\' 4"  (1.626 m), weight (!) 203.7 kg, last menstrual period 03/24/2009, SpO2 (!) 89 %.    Vent Mode: PRVC FiO2 (%):  [100 %] 100 % Set Rate:  [20 bmp] 20 bmp Vt Set:  [430 mL] 430 mL PEEP:  [16 cmH20] 16 cmH20 Plateau Pressure:  [27 cmH20-28 cmH20] 27 cmH20   Intake/Output Summary (Last 24 hours) at 11/14/2019 0726 Last data filed at 11/14/2019 4481 Gross per 24 hour  Intake 2584.59 ml  Output 5400 ml  Net -2815.41 ml   Filed Weights   11/01/2019 0800 11/11/19 0406 11/12/19 0410  Weight: (!) 175 kg (!) 199.6 kg (!) 203.7 kg     Physical Exam Constitutional:      Comments: Lethargic, opens eyes to voice, able to follow commands intermittently.     Resolved Hospital Problem list     Assessment & Plan:  Acute on chronic hypoxic/hypercarbic respiratory failure  Acute diastolic congestive heart failure Severe ARDS due to community-acquired pneumonia Acute kidney injury on CKD stage III Acute metabolic/toxic encephalopathy Normocytic anemia Morbid obesity Patient remains critically ill, continue to require high ventilatory setting to maintain O2 sat in low 90s, still on 100% FiO2, increased PEEP to 16. Her MRSA swab was positive, with remaining  BC showing NGTD. Staring Vancomycin and Zosyn today. Patient still showing signs of ARDS. We will continue to diurese and treat with antibiotics. ABG this AM shows metabolic alkalosis with additional additional respiratory alkalosis. Currently treating Metabolic alkalosis with acetazolamide. Patient's rate is currently 20 on ventilator, decrease rate to alleviate respiratory alkalosis.   - Patient with P/F ratio of 75 but unable to prone her due to morbid obesity - Continue  IV diuresis with Lasix, acetazolamide was added to prevent high bicarbonate  monitor intake and output - Ordered  vanc/zosyn - D/C levoflaxacin - Continue low-dose propofol and fentanyl with RASS goal of -1/-2 - Patient's hemoglobin is around 9, monitor H&H - Continue TF per dietician recommendations. - Trend BMP   Best practice:  Diet: TF Pain/Anxiety/Delirium protocol (if indicated): Sedation protocol VAP protocol (if indicated): Yes DVT prophylaxis: Heparin GI prophylaxis: PPI Glucose control: Sliding scale insulin protocol Mobility: Bedrest Code Status: Full Family Communication: Per Primary  Disposition: ICU  Maudie Mercury, MD IMTS, PGY-2 11/14/2019,11:42 AM

## 2019-11-14 NOTE — Progress Notes (Addendum)
Pharmacy Electrolyte Replacement  Recent Labs: Ionized calcium 1.01 K 3.7  Recent Labs    11/13/19 0537 11/13/19 1714 11/14/19 0558  K 4.1   < > 3.7  MG 2.3  --   --   PHOS 5.9*  --   --   CREATININE 2.14*  --  2.00*   < > = values in this interval not displayed.    Plan: calcium gluconate 1g IV x1 an KCl 9mEq per tube x1  Wilson Singer, PharmD PGY1 Pharmacy Resident 11/14/2019 8:40 AM

## 2019-11-14 NOTE — Progress Notes (Addendum)
[facility] Progress Note Patient Name: Kara Vincent DOB: Jan 28, 1966 MRN: [id number]   Date of Service  11/14/2019  HPI/Events of Note  Notified of desaturation. Camera assessment done and patient was being bagged. I am told she had sats in mid 80s during the day at best. These dropped down to 70s now, and RT bagged her. Easy bag, easily passing suction catheter, has some thick intermittently bloody secretions but not frankly bloody. O2 st increased to 90 and remained 89-90 after connecting her to the vent.   eICU Interventions  Unclear if she had a mucus plug that caused this? . Air entry equal. Will get a CXR stat and an ABG in 30 min. Appears well sedated on camera. BP stable. Was not proned due to BMI 77     Intervention Category Major Interventions: Respiratory failure - evaluation and management  Haiven Nardone G Dola Lunsford 11/14/2019, 10:04 PM   11 pm :  Reviewed CXR and ABG ABG improved as compared to earlier O2 sat is 90, which RN notes is higher than what is had been during the day Is well sedated and on lasix, diuresing well and tolerating this on 15 mic levophed Is net negative 2.5 liter so do not think fluid overload is an issue Possibly ha a mucus plug which got dislodged by bagging, has been on heparin for dvt ppx so will continue meds as ordered for now D/W RN   12:20 am Fever of 101 Last cultures negative more than 48 hours ago Repeat, and check cbc, cmp and lactate now (will not need 5 am labs after this) IN addition, cooling blanket ordered as last LFTs were deranged Plz call with labs   12: 20 am Notified that patient desaturated again to 79 Increase peep to 18 Seen on camera and again in no distress Requested ground team assess her   1:30 am Noted labs Adding tylenol for fever Was already seen by Dr Mariane Masters from PCCM   3.45 am Desaturation once again to 70s Comes up to 88 or so by active bagging Also increased levo dose upto 22 mic/min now Dr Mariane Masters  notified and will evaluate D/W bedside RNs. Unclear what else can be done at this time, peep is at 18 already

## 2019-11-14 NOTE — Progress Notes (Signed)
ABG results given to Maudie Mercury- MD Resident. Results called to Dr. Tacy Learn by resident at this time. No new orders received, RT will continue to monitor.

## 2019-11-15 LAB — URINALYSIS, ROUTINE W REFLEX MICROSCOPIC
Bilirubin Urine: NEGATIVE
Glucose, UA: NEGATIVE mg/dL
Hgb urine dipstick: NEGATIVE
Ketones, ur: NEGATIVE mg/dL
Leukocytes,Ua: NEGATIVE
Nitrite: NEGATIVE
Protein, ur: NEGATIVE mg/dL
Specific Gravity, Urine: 1.019 (ref 1.005–1.030)
pH: 5 (ref 5.0–8.0)

## 2019-11-15 LAB — CULTURE, BLOOD (ROUTINE X 2)
Culture: NO GROWTH
Culture: NO GROWTH
Special Requests: ADEQUATE
Special Requests: ADEQUATE

## 2019-11-15 LAB — POCT I-STAT 7, (LYTES, BLD GAS, ICA,H+H)
Acid-Base Excess: 6 mmol/L — ABNORMAL HIGH (ref 0.0–2.0)
Bicarbonate: 35.8 mmol/L — ABNORMAL HIGH (ref 20.0–28.0)
Calcium, Ion: 1.08 mmol/L — ABNORMAL LOW (ref 1.15–1.40)
HCT: 39 % (ref 36.0–46.0)
Hemoglobin: 13.3 g/dL (ref 12.0–15.0)
O2 Saturation: 73 %
Patient temperature: 98.8
Potassium: 3.8 mmol/L (ref 3.5–5.1)
Sodium: 143 mmol/L (ref 135–145)
TCO2: 38 mmol/L — ABNORMAL HIGH (ref 22–32)
pCO2 arterial: 77.5 mmHg (ref 32.0–48.0)
pH, Arterial: 7.274 — ABNORMAL LOW (ref 7.350–7.450)
pO2, Arterial: 46 mmHg — ABNORMAL LOW (ref 83.0–108.0)

## 2019-11-15 LAB — COMPREHENSIVE METABOLIC PANEL
ALT: 32 U/L (ref 0–44)
AST: 13 U/L — ABNORMAL LOW (ref 15–41)
Albumin: 2.8 g/dL — ABNORMAL LOW (ref 3.5–5.0)
Alkaline Phosphatase: 47 U/L (ref 38–126)
Anion gap: 11 (ref 5–15)
BUN: 58 mg/dL — ABNORMAL HIGH (ref 6–20)
CO2: 33 mmol/L — ABNORMAL HIGH (ref 22–32)
Calcium: 8.1 mg/dL — ABNORMAL LOW (ref 8.9–10.3)
Chloride: 99 mmol/L (ref 98–111)
Creatinine, Ser: 2.03 mg/dL — ABNORMAL HIGH (ref 0.44–1.00)
GFR calc Af Amer: 31 mL/min — ABNORMAL LOW (ref >60)
GFR calc non Af Amer: 27 mL/min — ABNORMAL LOW (ref >60)
Glucose, Bld: 135 mg/dL — ABNORMAL HIGH (ref 70–99)
Potassium: 3.7 mmol/L (ref 3.5–5.1)
Sodium: 143 mmol/L (ref 135–145)
Total Bilirubin: 1.8 mg/dL — ABNORMAL HIGH (ref 0.3–1.2)
Total Protein: 6.5 g/dL (ref 6.5–8.1)

## 2019-11-15 LAB — CBC
HCT: 40.1 % (ref 36.0–46.0)
HCT: 41.5 % (ref 36.0–46.0)
Hemoglobin: 11 g/dL — ABNORMAL LOW (ref 12.0–15.0)
Hemoglobin: 11.2 g/dL — ABNORMAL LOW (ref 12.0–15.0)
MCH: 26.4 pg (ref 26.0–34.0)
MCH: 26.6 pg (ref 26.0–34.0)
MCHC: 27 g/dL — ABNORMAL LOW (ref 30.0–36.0)
MCHC: 27.4 g/dL — ABNORMAL LOW (ref 30.0–36.0)
MCV: 97.1 fL (ref 80.0–100.0)
MCV: 97.6 fL (ref 80.0–100.0)
Platelets: 217 10*3/uL (ref 150–400)
Platelets: 223 10*3/uL (ref 150–400)
RBC: 4.13 MIL/uL (ref 3.87–5.11)
RBC: 4.25 MIL/uL (ref 3.87–5.11)
RDW: 17.8 % — ABNORMAL HIGH (ref 11.5–15.5)
RDW: 17.8 % — ABNORMAL HIGH (ref 11.5–15.5)
WBC: 33.8 10*3/uL — ABNORMAL HIGH (ref 4.0–10.5)
WBC: 38.9 10*3/uL — ABNORMAL HIGH (ref 4.0–10.5)
nRBC: 0 % (ref 0.0–0.2)
nRBC: 0.1 % (ref 0.0–0.2)

## 2019-11-15 LAB — BASIC METABOLIC PANEL
Anion gap: 14 (ref 5–15)
BUN: 53 mg/dL — ABNORMAL HIGH (ref 6–20)
CO2: 31 mmol/L (ref 22–32)
Calcium: 8.2 mg/dL — ABNORMAL LOW (ref 8.9–10.3)
Chloride: 98 mmol/L (ref 98–111)
Creatinine, Ser: 1.97 mg/dL — ABNORMAL HIGH (ref 0.44–1.00)
GFR calc Af Amer: 32 mL/min — ABNORMAL LOW (ref >60)
GFR calc non Af Amer: 28 mL/min — ABNORMAL LOW (ref >60)
Glucose, Bld: 132 mg/dL — ABNORMAL HIGH (ref 70–99)
Potassium: 3.6 mmol/L (ref 3.5–5.1)
Sodium: 143 mmol/L (ref 135–145)

## 2019-11-15 LAB — LACTIC ACID, PLASMA: Lactic Acid, Venous: 1.5 mmol/L (ref 0.5–1.9)

## 2019-11-15 LAB — GLUCOSE, CAPILLARY
Glucose-Capillary: 109 mg/dL — ABNORMAL HIGH (ref 70–99)
Glucose-Capillary: 146 mg/dL — ABNORMAL HIGH (ref 70–99)
Glucose-Capillary: 156 mg/dL — ABNORMAL HIGH (ref 70–99)
Glucose-Capillary: 159 mg/dL — ABNORMAL HIGH (ref 70–99)
Glucose-Capillary: 160 mg/dL — ABNORMAL HIGH (ref 70–99)
Glucose-Capillary: 166 mg/dL — ABNORMAL HIGH (ref 70–99)

## 2019-11-15 LAB — MAGNESIUM: Magnesium: 2.4 mg/dL (ref 1.7–2.4)

## 2019-11-15 LAB — SARS CORONAVIRUS 2 BY RT PCR (HOSPITAL ORDER, PERFORMED IN ~~LOC~~ HOSPITAL LAB): SARS Coronavirus 2: NEGATIVE

## 2019-11-15 MED ORDER — ROCURONIUM BROMIDE 10 MG/ML (PF) SYRINGE
PREFILLED_SYRINGE | INTRAVENOUS | Status: AC
  Administered 2019-11-15: 100 mg
  Filled 2019-11-15: qty 10

## 2019-11-15 MED ORDER — ACETAMINOPHEN 160 MG/5ML PO SOLN
650.0000 mg | ORAL | Status: DC | PRN
  Administered 2019-11-15 – 2019-11-16 (×2): 650 mg
  Filled 2019-11-15 (×3): qty 20.3

## 2019-11-15 MED ORDER — POTASSIUM CHLORIDE 20 MEQ/15ML (10%) PO SOLN
40.0000 meq | Freq: Once | ORAL | Status: AC
  Administered 2019-11-15: 40 meq
  Filled 2019-11-15: qty 30

## 2019-11-15 MED ORDER — BISACODYL 10 MG RE SUPP
10.0000 mg | Freq: Once | RECTAL | Status: DC
  Filled 2019-11-15: qty 1

## 2019-11-15 MED ORDER — ROCURONIUM BROMIDE 50 MG/5ML IV SOLN
100.0000 mg | Freq: Once | INTRAVENOUS | Status: AC
  Filled 2019-11-15: qty 10

## 2019-11-15 MED ORDER — VECURONIUM BROMIDE 10 MG IV SOLR
10.0000 mg | Freq: Once | INTRAVENOUS | Status: AC
  Administered 2019-11-15: 10 mg via INTRAVENOUS
  Filled 2019-11-15: qty 10

## 2019-11-15 MED ORDER — LACTULOSE 10 GM/15ML PO SOLN
30.0000 g | ORAL | Status: DC
  Administered 2019-11-15 – 2019-11-16 (×8): 30 g
  Filled 2019-11-15 (×9): qty 45

## 2019-11-15 MED ORDER — NOREPINEPHRINE 16 MG/250ML-% IV SOLN
0.0000 ug/min | INTRAVENOUS | Status: DC
  Administered 2019-11-15: 40 ug/min via INTRAVENOUS
  Administered 2019-11-15: 22 ug/min via INTRAVENOUS
  Administered 2019-11-15: 31 ug/min via INTRAVENOUS
  Administered 2019-11-15: 40 ug/min via INTRAVENOUS
  Filled 2019-11-15 (×3): qty 250

## 2019-11-15 MED ORDER — VECURONIUM NICU IV SYRINGE 1 MG/ML: 10.0000 mg/kg | Freq: Once | INTRAVENOUS | Status: DC

## 2019-11-15 MED ORDER — LACTULOSE 10 GM/15ML PO SOLN: 30.0000 g | ORAL | Status: DC

## 2019-11-15 MED ORDER — FUROSEMIDE 10 MG/ML IJ SOLN
80.0000 mg | Freq: Two times a day (BID) | INTRAMUSCULAR | Status: DC
  Administered 2019-11-15: 80 mg via INTRAVENOUS
  Filled 2019-11-15: qty 8

## 2019-11-15 NOTE — Progress Notes (Signed)
Ice packs placed on patient for temp of 101.

## 2019-11-15 NOTE — Progress Notes (Signed)
Asked to see for O2 sats in the 70's.  On my arrival she has saturations of 83%, ABG looks okay done about an hour ago.  Will try a dose of rocuronium without any improvement.  Little else to offer.

## 2019-11-15 NOTE — Consult Note (Signed)
[facility]                                                                                        Patient Identification: Patient Name: Kara Vincent MRN: [id number] [place] Date: 11/12/2019 12:23 AM Age: 54 y.o. HAR: Today's Date: 11/15/2019 Admit Diagnosis: Acute on Chronic respiratory Failure DOB: 08-Aug-1965   Antibiotics: Levofloxacin Day 5                   Vancomycin Day 6                   Pip/Tazo Day 2  Assessment 33 Y O morbidly female with multiple comorbidities including OHS/OSA, CHF, CKD who presented to the ED on 8/19 with complaints of shortness of breath. Admitted in the MICU with  Acute on Chronic respiratory failure in the setting of h/o OSA/OHS, CHF and fluid overload SARS CoVC*2 negative The clinical picture is more suggestive of an ARDS with fluid overload than a PNA. A CT chest might help to check for consolidation  Leukocytosis/Shock - She has low grade fevers that was not present on admission. Her leukocytosis was initially elevated at 16K, resolved to 10K. However, there was a sudden jump in the WBC to 38.9K yesterday. She is on levophed of 30.  Per RN, she does not have diarrhea, no increasing secretions from the ET tube, no known wounds/ulcers. She has a Left IJ central line that has been placed on 10/31/2019. This could be a source of infection given no definite source of infection identified so far.   Recommendations  A s stated above, the clinical picture is more suggestive of a fluid overload and ARDS. Would not continue the Vancomycin and Pip/Tazo for more than 7 days total  Respiratory cultures if able to get Would recommend a set of blood cultures ( 1 set from the peripheral and another set from the Left IJ) Following   Rosiland Oz, MD [facility]   __________________________________________________________________________________________________________ HPI and Hospital Course: 54 Y O morbidly female with multiple comorbidities including OHS/OSA, CHF, CKD who presented to the ED on 8/19 with complaints of shortness of breath.  She was found in the triage area to be very lethargic. Placed on BiPAP. She was on a responsive to Narcan , required intubation and has been admitted to the MICU. She remians critically ill and has been requiring increasing oxygen requirement. She has been diuresed for fluid overload/CHF exacerbation. She is currently with Fio02 of 100% with PEEP 18. She is also on levophed of 30. ID consulted for leukocytosis and possible PNA.  ROS - unable as patient is intubated   Past Medical History:  Diagnosis Date  . Allergy   . Anxiety   . Arthritis   . Asthma   . Asthma   . CHF (congestive heart failure) ([place])   . CHF (congestive heart failure) ([place])   . Chronic abdominal pain   . Chronic kidney disease   . Chronic pain    "all over"  . Darier disease    chronic, followed by  Dr. 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 GERD (gastroesophageal reflux disease)   . Gout   . Hidradenitis   . HLD (hyperlipidemia) 12/14/2015  . Homelessness   . Hyperlipemia   . Hypertension   . Low back pain   . Morbid obesity ([place])    uses motor wheel chair  . MRSA (methicillin resistant Staphylococcus aureus)    states about a year ago  . On home O2    3L N/C O2 continuously  . OSA (obstructive sleep apnea)    non-compliant with CPAP  . Oxygen deficiency   . Renal insufficiency   . Sleep apnea    Past Surgical History:  Procedure Laterality Date  . BACK SURGERY    . CESAREAN SECTION    . CESAREAN SECTION    . FRACTURE SURGERY    . TUBAL LIGATION       No current facility-administered medications on file prior to encounter.   Current Outpatient Medications on File Prior to Encounter  Medication Sig Dispense Refill  . acetaminophen-codeine  (TYLENOL #3) 300-30 MG tablet Take 1 tablet by mouth every 8 (eight) hours as needed for moderate pain. 90 tablet 0  . albuterol (VENTOLIN HFA) 108 (90 Base) MCG/ACT inhaler Inhale 1-2 puffs into the lungs every 6 (six) hours as needed for wheezing or shortness of breath. 8 g 6  . allopurinol (ZYLOPRIM) 100 MG tablet TAKE ONE TABLET BY MOUTH EVERY MORNING 30 tablet 6  . ALPRAZolam (XANAX) 0.5 MG tablet Take 1 tablet (0.5 mg total) by mouth 3 (three) times daily as needed for sleep or anxiety. 30 tablet 0  . arformoterol (BROVANA) 15 MCG/2ML NEBU Take 2 mLs (15 mcg total) by nebulization 2 (two) times daily. 120 mL 1  . benzonatate (TESSALON) 100 MG capsule TAKE ONE CAPSULE BY MOUTH EVERY 6 HOURS AS NEEDED FOR COUGH 30 capsule 0  . bisacodyl (DULCOLAX) 5 MG EC tablet Take 2 tablets (10 mg total) by mouth daily. 30 tablet 0  . budesonide (PULMICORT) 0.25 MG/2ML nebulizer solution Take 2 mLs (0.25 mg total) by nebulization 2 (two) times daily. 60 mL 12  . camphor-menthol (SARNA) lotion Apply topically as needed for itching. 222 mL 0  . cholecalciferol (VITAMIN D3) 25 MCG (1000 UNIT) tablet Take 1,000 Units by mouth daily.    . clindamycin (CLEOCIN) 300 MG capsule Take 1 capsule (300 mg total) by mouth 3 (three) times daily. 21 capsule 0  . cloNIDine (CATAPRES) 0.2 MG tablet TAKE ONE TABLET BY MOUTH TWICE DAILY (Patient taking differently: Take 0.2 mg by mouth 2 (two) times daily. ) 60 tablet 5  . Collagen-Boron-Hyaluronic Acid ([employer] JOINT HEALTH TRIPLE ACTION PO) Take 1 tablet by mouth daily.    [name] darifenacin (ENABLEX) 7.5 MG 24 hr tablet Take 1 tablet (7.5 mg total) by mouth daily. 30 tablet 2  . DULoxetine (CYMBALTA) 30 MG capsule TAKE ONE CAPSULE BY MOUTH DAILY 30 capsule 5  . furosemide (LASIX) 40 MG tablet Take 1 tablet (40 mg total) by mouth daily. 1 tab po daily 60 tablet 3  . guaiFENesin-dextromethorphan (ROBITUSSIN DM) 100-10 MG/5ML syrup Take 5 mLs by mouth every 4 (four) hours as needed for  cough. 118 mL 0  . hydrOXYzine (ATARAX/VISTARIL) 50 MG tablet TAKE ONE TABLET BY MOUTH THREE TIMES DAILY AS NEEDED FOR ANXIETY OR FOR ITCHING (Patient taking differently: Take 50 mg by mouth every 8 (eight) hours as needed for anxiety. ) 60 tablet 1  . ipratropium-albuterol (DUONEB) 0.5-2.5 (3) MG/3ML SOLN Take  3 mLs by nebulization every 6 (six) hours as needed (SOB). 3 mL 6  . isosorbide mononitrate (IMDUR) 30 MG 24 hr tablet Take 1 tablet (30 mg total) by mouth daily. 30 tablet 6  . ketoconazole (NIZORAL) 2 % shampoo Apply topically 2 (two) times a week. 120 mL 0  . ketotifen (ZADITOR) 0.025 % ophthalmic solution Place 1 drop into both eyes 2 (two) times daily. 5 mL 0  . Misc. Devices MISC Please provide patient with a raised toilet seat that is covered under her current insurance plan. 1 each 0  . Misc. Devices MISC Heavy duty shower chair; Heavy duty potty chair. Dx: Osteoarthritis 1 each 0  . mupirocin ointment (BACTROBAN) 2 % Place 1 application into the nose 2 (two) times daily. 22 g 0  . nystatin (MYCOSTATIN/NYSTOP) powder Apply topically 2 (two) times daily. 15 g 0  . Omega-3 Fatty Acids (FISH OIL) 1000 MG CAPS Take 1 capsule (1,000 mg total) by mouth daily. 90 capsule 0  . pantoprazole (PROTONIX) 40 MG tablet Take 1 tablet (40 mg total) by mouth 2 (two) times daily before a meal. 60 tablet 1  . polyethylene glycol (MIRALAX / GLYCOLAX) 17 g packet Take 17 g by mouth daily as needed. (Patient taking differently: Take 17 g by mouth daily as needed for mild constipation. ) 14 each 0  . predniSONE (DELTASONE) 10 MG tablet Take 1 tablet (10 mg total) by mouth daily. Take 3 tablets daily for the first 3 days then 2 tablets daily for the next 3 days and then 1 tablet daily till all the tablets are done. 30 tablet 0  . senna-docusate (SENOKOT-S) 8.6-50 MG tablet Take 1 tablet by mouth at bedtime as needed for mild constipation. 30 tablet 1  . solifenacin (VESICARE) 5 MG tablet Take 5 mg by mouth  every morning.    . triamcinolone cream (KENALOG) 0.1 % Apply 170 application topically 2 (two) times daily. To rash on back 453 g 1  . vitamin B-12 (CYANOCOBALAMIN) 500 MCG tablet Take 500 mcg by mouth daily.    . Vitamin E 100 units TABS Take 1 tablet by mouth daily. 30 tablet 2  . [DISCONTINUED] diphenhydrAMINE (SOMINEX) 25 MG tablet Take 25 mg by mouth at bedtime as needed.     Allergies  Allergen Reactions  . Ibuprofen Hives, Shortness Of Breath, Palpitations and Rash    Has tolerated toradol 12/2013 inpatient  . Sulfa Antibiotics Hives, Shortness Of Breath, Rash and Cough  . Adhesive [Tape] Rash  . Doxycycline Hives, Swelling and Rash  . Sulfamethoxazole-Trimethoprim Rash  . Zithromax [Azithromycin] Hives, Swelling and Rash    Social History   Socioeconomic History  . Marital status: Single    Spouse name: Not on file  . Number of children: Not on file  . Years of education: Not on file  . Highest education level: Not on file  Occupational History  . Not on file  Tobacco Use  . Smoking status: Former Smoker    Types: Cigarettes    Quit date: 03/25/2003    Years since quitting: 16.6  . Smokeless tobacco: Former Systems developer    Quit date: 05/27/1978  Vaping Use  . Vaping Use: Never used  Substance and Sexual Activity  . Alcohol use: Yes    Alcohol/week: 0.0 standard drinks    Comment: years ago  no longer beer only  early 20's  . Drug use: Yes    Types: "Crack" cocaine, Other-see comments  Comment: years  ago  . Sexual activity: Yes    Birth control/protection: Other-see comments, None, Abstinence  Other Topics Concern  . Not on file  Social History Narrative   ** Merged History Encounter **       Patient has exhausted [facility] financial resources. She does have access to [employer] and [employer]. She should be redirected to purse disability with her attorney and reapply of Medicaid   Social Determinants of Health   Financial Resource Strain:   . Difficulty of  Paying Living Expenses: Not on file  Food Insecurity:   . Worried About Charity fundraiser in the Last Year: Not on file  . Ran Out of Food in the Last Year: Not on file  Transportation Needs:   . Lack of Transportation (Medical): Not on file  . Lack of Transportation (Non-Medical): Not on file  Physical Activity:   . Days of Exercise per Week: Not on file  . Minutes of Exercise per Session: Not on file  Stress:   . Feeling of Stress : Not on file  Social Connections:   . Frequency of Communication with Friends and Family: Not on file  . Frequency of Social Gatherings with Friends and Family: Not on file  . Attends Religious Services: Not on file  . Active Member of Clubs or Organizations: Not on file  . Attends Archivist Meetings: Not on file  . Marital Status: Not on file  Intimate Partner Violence:   . Fear of Current or Ex-Partner: Not on file  . Emotionally Abused: Not on file  . Physically Abused: Not on file  . Sexually Abused: Not on file    Vitals BP 111/75   Pulse 72   Temp 98.8 F (37.1 C) (Oral)   Resp 20   Ht 5\' 4"  (1.626 m)   Wt (!) 192.3 kg   LMP 03/24/2009   SpO2 (!) 66%   BMI 72.78 kg/m    Examination  Patient is Obese, sedated and intubated( fentanyl, propofol, levophed) PERRLA Short neck excorisations in the upper extremities Chest - decreased bilateral air entry CVS muffled heart sounds Abdomen large, obese Neuro - not examined  LINES/TUBES: Left IJ Central line 8/19  METAL IMPLANT/HARDWARE:  Pertinent Microbiology  11/17/2019 Blood cx 2/2 sets NGTD 11/07/2019 MRSA DNA PCR negative   Pertinent Lab seen by me: CBC Latest Ref Rng & Units 11/15/2019 11/15/2019 11/15/2019  WBC 4.0 - 10.5 K/uL - 38.9(H) 33.8(H)  Hemoglobin 12.0 - 15.0 g/dL 13.3 11.2(L) 11.0(L)  Hematocrit 36 - 46 % 39.0 41.5 40.1  Platelets 150 - 400 K/uL - 223 217   CMP Latest Ref Rng & Units 11/15/2019 11/15/2019 11/15/2019  Glucose 70 - 99 mg/dL - 132(H) 135(H)   BUN 6 - 20 mg/dL - 53(H) 58(H)  Creatinine 0.44 - 1.00 mg/dL - 1.97(H) 2.03(H)  Sodium 135 - 145 mmol/L 143 143 143  Potassium 3.5 - 5.1 mmol/L 3.8 3.6 3.7  Chloride 98 - 111 mmol/L - 98 99  CO2 22 - 32 mmol/L - 31 33(H)  Calcium 8.9 - 10.3 mg/dL - 8.2(L) 8.1(L)  Total Protein 6.5 - 8.1 g/dL - - 6.5  Total Bilirubin 0.3 - 1.2 mg/dL - - 1.8(H)  Alkaline Phos 38 - 126 U/L - - 47  AST 15 - 41 U/L - - 13(L)  ALT 0 - 44 U/L - - 32    Pertinent Imagings/Other Imagings Plain films and CT images have been personally visualized  and interpreted; radiology reports have been reviewed. Decision making incorporated into the Impression / Recommendations.

## 2019-11-15 NOTE — Procedures (Signed)
Arterial Catheter Insertion Procedure Note  Kara Vincent  [id number]  07-16-1965  Date:11/15/19  Time:12:05 PM    Provider Performing: Jacky Kindle    Procedure: Insertion of Arterial Line [phone]) with US guidance ([id number])   Indication(s) Blood pressure monitoring and/or need for frequent ABGs  Consent Unable to obtain consent due to emergent nature of procedure.  Anesthesia None   Time Out Verified patient identification, verified procedure, site/side was marked, verified correct patient position, special equipment/implants available, medications/allergies/relevant history reviewed, required imaging and test results available.   Sterile Technique Maximal sterile technique including full sterile barrier drape, hand hygiene, sterile gown, sterile gloves, mask, hair covering, sterile ultrasound probe cover (if used).   Procedure Description Area of catheter insertion was cleaned with chlorhexidine and draped in sterile fashion. With real-time ultrasound guidance an arterial catheter was placed into the left Axillary artery.  Appropriate arterial tracings confirmed on monitor.     Complications/Tolerance None; patient tolerated the procedure well.   EBL Minimal   Specimen(s) None

## 2019-11-15 NOTE — Progress Notes (Signed)
Pharmacy Electrolyte Replacement  Recent Labs: K 3.6  Recent Labs    11/13/19 0537 11/13/19 1714 11/15/19 0400  K 4.1   < > 3.6  MG 2.3  --  2.4  PHOS 5.9*  --   --   CREATININE 2.14*   < > 1.97*   < > = values in this interval not displayed.     Plan: KCl 40 mEq per tube x1   Wilson Singer, PharmD PGY1 Pharmacy Resident 11/15/2019 7:35 AM

## 2019-11-15 NOTE — Progress Notes (Signed)
NAME:  LAQUANNA VEAZEY, MRN:  [id number], DOB:  Aug 18, 1965, LOS: 5 ADMISSION DATE:  10/28/2019, CONSULTATION DATE: 10/29/2019 REFERRING MD: Emergency department physician, CHIEF COMPLAINT: Hypercarbic respiratory failure  Brief History   Morbid obese female with OHS OSA polypharmacy use intubated for acute on chronic hypoxic/hypercarbic respiratory failure due to community-acquired pneumonia and acute diastolic CHF exacerbation  Past Medical History   Past Medical History:  Diagnosis Date  . Allergy   . Anxiety   . Arthritis   . Asthma   . Asthma   . CHF (congestive heart failure) ([place])   . CHF (congestive heart failure) ([place])   . Chronic abdominal pain   . Chronic kidney disease   . Chronic pain    "all over"  . 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 GERD (gastroesophageal reflux disease)   . Gout   . Hidradenitis   . HLD (hyperlipidemia) 12/14/2015  . Homelessness   . Hyperlipemia   . Hypertension   . Low back pain   . Morbid obesity ([place])    uses motor wheel chair  . MRSA (methicillin resistant Staphylococcus aureus)    states about a year ago  . On home O2    3L N/C O2 continuously  . OSA (obstructive sleep apnea)    non-compliant with CPAP  . Oxygen deficiency   . Renal insufficiency   . Sleep apnea      Significant Hospital Events   8/19 intubation/mechanical ventilation  Consults:  PCCM Neurology  Procedures:    Significant Diagnostic Tests:  8/19 chest x-ray shows severe cardiomegaly with bilateral interstitial prominence and small right pleural effusion  8/20 CT head shows no acute abnormality  8/20 -8/21 EEG: No seizures Micro Data:  8/19 respiratory culture >> NGTD  8/19 blood cultures x2 >> NGTD  8/19 MRSA PCR>> positive  Antimicrobials:  Levaquin 8/19 Vancomycin 8/19 >> 8/21 Vanc 8/23> Zosyn 8/23>  Interim history/subjective:  Patient continues to clinically worsen. Desaturating ON into the 70s requiring bagging and  suction of bloody secretions.  Leukocytosis has increased to 38 from 16 with fevering events overnight. Will reach out to Infectious Disease today to assist in Ms. Mabus's care.   Objective   Blood pressure 107/60, pulse 77, temperature (!) 100.9 F (38.3 C), temperature source Axillary, resp. rate 20, height 5\' 4"  (1.626 m), weight (!) 192.3 kg, last menstrual period 03/24/2009, SpO2 (!) 81 %.    Vent Mode: PRVC FiO2 (%):  [100 %] 100 % Set Rate:  [20 bmp] 20 bmp Vt Set:  [430 mL] 430 mL PEEP:  [16 cmH20-18 cmH20] 18 cmH20 Plateau Pressure:  [26 cmH20-30 cmH20] 30 cmH20   Intake/Output Summary (Last 24 hours) at 11/15/2019 0732 Last data filed at 11/15/2019 0600 Gross per 24 hour  Intake 3446.95 ml  Output 3175 ml  Net 271.95 ml   Filed Weights   11/11/19 0406 11/12/19 0410 11/15/19 0352  Weight: (!) 199.6 kg (!) 203.7 kg (!) 192.3 kg    Physical Exam Constitutional:      Appearance: She is ill-appearing.     Comments: Morbidly obese female, minimally responsive. Intubated.   Cardiovascular:     Rate and Rhythm: Normal rate and regular rhythm.     Heart sounds: No murmur heard.  No friction rub. No gallop.   Pulmonary:     Effort: Tachypnea present.     Comments: Coarse breath sounds appreciated bilaterally.  Abdominal:     General:  Bowel sounds are normal.     Palpations: Abdomen is soft. There is no mass.     Tenderness: There is no abdominal tenderness. There is no guarding.  Skin:    General: Skin is warm.  Neurological:     Comments: Able to open eyes to verbal stimuli, moving all limbs sporadically, does not follow commands.      Resolved Hospital Problem list     Assessment & Plan:  Acute on chronic hypoxic/hypercarbic respiratory failure  Acute diastolic congestive heart failure Severe ARDS due to community-acquired pneumonia Acute kidney injury on CKD stage III Acute metabolic/toxic encephalopathy Normocytic anemia Morbid obesity Patient remains  critically ill, continue to require high ventilatory setting to maintain O2 sat in low-mid 80s, still on 100% FiO2, increased PEEP to 18 from 16. Micro labs unrevealing. Patient still showing signs of ARDS. We will continue to diurese and treat with antibiotics. Her p/f ratio continues to worsen down to 69 from 75 yesterday. Overnight she desaturated into the 70s, and was bagged, suctioned with bloody secretions, and reconnected to the ventilator. O2 saturations increased to 89-90. Fever of 101 F, decreased to 100.9 F with a spike of her leukocytosis from 16 to 38.  - Increasing IV Lasix to 80 mg IV BID.  - Will consult ID today, we appreciate their recommendations.  - Continue vanc/zosyn - Continue propofol and fentanyl with RASS goal of -1/-2 - Ordered Vecuronium 10 mg IV  - Patient's hemoglobin is around 11.2 - Continue TF per dietician recommendations. - Trend BMP - Reordered UA, BCx2, respiratory culture.    Best practice:  Diet: TF Pain/Anxiety/Delirium protocol (if indicated): Sedation protocol VAP protocol (if indicated): Yes DVT prophylaxis: Heparin GI prophylaxis: PPI Glucose control: Sliding scale insulin protocol Mobility: Bedrest Code Status: Full Family Communication: Per Primary  Disposition: ICU  Maudie Mercury, MD IMTS, PGY-2 11/15/2019,7:32 AM

## 2019-11-15 NOTE — Progress Notes (Signed)
Critical ABG results given to Dr. Tacy Learn. No new orders received at this time. RT will continue to monitor.

## 2019-11-16 LAB — POCT I-STAT 7, (LYTES, BLD GAS, ICA,H+H)
Acid-base deficit: 4 mmol/L — ABNORMAL HIGH (ref 0.0–2.0)
Bicarbonate: 27.9 mmol/L (ref 20.0–28.0)
Calcium, Ion: 0.98 mmol/L — ABNORMAL LOW (ref 1.15–1.40)
HCT: 40 % (ref 36.0–46.0)
Hemoglobin: 13.6 g/dL (ref 12.0–15.0)
O2 Saturation: 34 %
Patient temperature: 102.7
Potassium: 5.4 mmol/L — ABNORMAL HIGH (ref 3.5–5.1)
Sodium: 142 mmol/L (ref 135–145)
TCO2: 30 mmol/L (ref 22–32)
pCO2 arterial: 94.3 mmHg (ref 32.0–48.0)
pH, Arterial: 7.093 — CL (ref 7.350–7.450)
pO2, Arterial: 33 mmHg — CL (ref 83.0–108.0)

## 2019-11-16 LAB — GLUCOSE, CAPILLARY: Glucose-Capillary: 157 mg/dL — ABNORMAL HIGH (ref 70–99)

## 2019-11-16 MED ORDER — EPINEPHRINE HCL 5 MG/250ML IV SOLN IN NS
INTRAVENOUS | Status: AC
  Filled 2019-11-16: qty 250

## 2019-11-16 MED ORDER — SODIUM BICARBONATE 8.4 % IV SOLN
200.0000 meq | Freq: Once | INTRAVENOUS | Status: AC
  Administered 2019-11-16: 200 meq via INTRAVENOUS

## 2019-11-16 MED ORDER — STERILE WATER FOR INJECTION IV SOLN
INTRAVENOUS | Status: DC
  Filled 2019-11-16: qty 850

## 2019-11-16 MED ORDER — EPINEPHRINE HCL 5 MG/250ML IV SOLN IN NS: 0.5000 ug/min | INTRAVENOUS | Status: DC

## 2019-11-16 MED ORDER — SODIUM BICARBONATE 8.4 % IV SOLN
INTRAVENOUS | Status: AC
  Filled 2019-11-16: qty 200

## 2019-11-16 MED ORDER — ALBUMIN HUMAN 25 % IV SOLN
25.0000 g | Freq: Once | INTRAVENOUS | Status: DC
  Filled 2019-11-16: qty 100

## 2019-11-16 MED ORDER — SODIUM BICARBONATE 8.4 % IV SOLN
100.0000 meq | Freq: Once | INTRAVENOUS | Status: AC
  Administered 2019-11-16: 100 meq via INTRAVENOUS
  Filled 2019-11-16: qty 100

## 2019-11-16 MED FILL — Medication: Qty: 1 | Status: AC

## 2019-11-17 ENCOUNTER — Ambulatory Visit: Payer: Medicaid Other | Admitting: Internal Medicine

## 2019-11-20 LAB — CULTURE, BLOOD (ROUTINE X 2): Culture: NO GROWTH

## 2019-11-23 NOTE — Progress Notes (Signed)
Patient EKG showed asystole.  Verified by this RN and Brien Mates, RN no heart tones.  Sister Marlowe Kays notified of time of death.  CDS notified but was going to a Donor associate call back to determine whether or not patient appropriate for donation.

## 2019-11-23 NOTE — Progress Notes (Addendum)
[facility] Progress Note Patient Name: Kara Vincent DOB: 17-Feb-1966 MRN: [id number]   Date of Service  2019-11-21  HPI/Events of Note  Hypotension - BP = 60/34 with MAP = 44. ABG on 100%/PRVC /TV 430. Her prognosis appears grim. She is a full code. However, I doubt that she will survive this hospitalization. GOC needs to be discussed.   eICU Interventions  Plan: 1. ABG STAT. 2. NaHCO3 300 meq IV now.  3 NaHCO3 IV infusion to run IV at 125 mL/hour. 4. Increase ceiling on Norepinephrine IV infusion to 70 mcg/min. 5. Epinephrine IV infusion. Titrate to MAP >= 65.  6. Will ask ground team to evaluate the patient at bedside.       Intervention Category Major Interventions: Hypotension - evaluation and management  Cayley Pester [place] 21-Nov-2019, 4:25 AM

## 2019-11-23 NOTE — Progress Notes (Signed)
[facility] Progress Note Patient Name: Kara Vincent DOB: November 02, 1965 MRN: [id number]   Date of Service  27-Nov-2019  HPI/Events of Note  Diarrhea - patient on Lactulose - Request for Flexiseal.   eICU Interventions  Plan: 1. Place Flexiseal.      Intervention Category Major Interventions: Other:  Lysle Dingwall 11-27-2019, 12:23 AM

## 2019-11-23 NOTE — Death Summary Note (Addendum)
DEATH SUMMARY   Patient Details  Name: Kara Vincent MRN: [id number] DOB: October 17, 1965  Admission/Discharge Information   Admit Date:  Nov 17, 2019  Date of Death: Date of Death: 2019-11-23  Time of Death: Time of Death: 0545  Length of Stay: Jun 04, 2022  Referring Physician: Ladell Pier, MD   Reason(s) for Hospitalization  Acute hypoxic/hypercapnic respiratory failure due to severe ARDS from pneumonia Acute on chronic diastolic congestive heart failure Severe sepsis (POA), complicated with septic shock due to pneumonia/ARDS Acute metabolic/toxic encephalopathy Acute kidney injury on CKD stage III Morbid obesity  Diagnoses  Preliminary cause of death: Puerperal sepsis with acute hypoxic respiratory failure ([place]) Secondary Diagnoses (including complications and co-morbidities):  Active Problems:   Acute respiratory failure with hypercapnia (HCC)   Acute on chronic respiratory failure (HCC)   [facility])   Brief Hospital Course (including significant findings, care, treatment, and services provided and events leading to death)  Kara Vincent is a 54 y.o. year old female who presented with acute hypoxic/hypercapnic respiratory failure due to ARDS and acute diastolic congestive heart failure, she was intubated and was admitted to ICU.  She was started on broad-spectrum antibiotics, her blood cultures, respiratory culture and urine culture came back negative but her oxygen requirement kept going up but she was on maximum ventilatory support but continued to get worse, patient's family was updated every day about her deteriorating condition.  She developed severe ARDS, antibiotics were switched and escalated to vancomycin and Zosyn.  Patient's sister and patient's close friend were both updated about patient's condition, her sister who is healthcare proxy, she made her DNR.  Due to severe septic shock and respiratory failure patient succumbed to death on 2019/11/23 at 5:45  AM.    Pertinent Labs and Studies  Significant Diagnostic Studies EEG  Result Date: 11/12/2019 Lora Havens, MD     11/12/2019 11:45 AM Patient Name: Kara Vincent MRN: [id number] Epilepsy Attending: Lora Havens Referring Physician/Provider: Dr Gaylan Gerold Date: 11/12/2019 Duration: 2 hour 59 mins Patient history: 54 year old morbidly obese female who was admitted for respiratory distress, currently intubated and then noted to have seizure-like activity.  EEG evaluate for seizures.  Level of alertness: lethargic  AEDs during EEG study: Propofol, Keppra  Technical aspects: This EEG study was done with scalp electrodes positioned according to the 10-20 International system of electrode placement. Electrical activity was acquired at a sampling rate of 500Hz  and reviewed with a high frequency filter of 70Hz  and a low frequency filter of 1Hz . EEG data were recorded continuously and digitally stored.  Description: EEG initially showed continuous generalized rhythmic sharply contoured 2 to 3 Hz delta slowing admixed with 13 to 15 Hz generalized beta activity. Propofol was stopped after which eeg showed intermittent generalized rhythmic sharply contoured 2-3Hz  delta activity which can be on the ictal-interictal continuum. There is also intermittent generalized polymorphic mixed frequencies with predominantly  5-9Hz  theta-alpha activity. Hyperventilation and photic stimulation were not performed.    ABNORMALITY -Intermittent rhythmic delta slow, generalized  IMPRESSION: This study initially showed severe diffuse encephalopathy, nonspecific to etiology but likely related to sedation.  Once propofol was stopped, eeg showed intermittent rhythmic sharply contoured delta activity which is on the ictal-intrictal continuum with low propensity for seizures. Comparing the eeg before and after stopping propofol, this pattern is favored to be most likely secondary to sedation. No seizures or definite  epileptiform discharges were seen throughout the recording.  Lora Havens   DG Chest 2 View  Result Date: 11/09/2019 CLINICAL DATA:  Shortness of breath and chest pain. EXAM: CHEST - 2 VIEW COMPARISON:  Aug 14, 2019 FINDINGS: Mildly increased perihilar pulmonary vasculature is seen. A mild to moderate severity right basilar atelectasis and/or infiltrate is noted. There is no evidence of a pleural effusion or pneumothorax. The cardiac silhouette is markedly enlarged. There is persistent widening of the superior mediastinum. The visualized skeletal structures are unremarkable. IMPRESSION: 1. Cardiomegaly with mild perihilar pulmonary vascular congestion. 2. Mild to moderate severity right basilar atelectasis and/or infiltrate. Electronically Signed   By: Virgina Norfolk M.D.   On: 11/09/2019 19:39   CT HEAD WO CONTRAST  Result Date: 11/11/2019 CLINICAL DATA:  Intracranial hemorrhage suspected. EXAM: CT HEAD WITHOUT CONTRAST TECHNIQUE: Contiguous axial images were obtained from the base of the skull through the vertex without intravenous contrast. COMPARISON:  01/22/2017 FINDINGS: Brain: There is no mass, hemorrhage or extra-axial collection. The size and configuration of the ventricles and extra-axial CSF spaces are normal. The brain parenchyma is normal, without acute or chronic infarction. Vascular: No abnormal hyperdensity of the major intracranial arteries or dural venous sinuses. No intracranial atherosclerosis. Skull: The visualized skull base, calvarium and extracranial soft tissues are normal. Sinuses/Orbits: No fluid levels or advanced mucosal thickening of the visualized paranasal sinuses. No mastoid or middle ear effusion. The orbits are normal. IMPRESSION: Normal head CT. Electronically Signed   By: Ulyses Jarred M.D.   On: 11/11/2019 02:06   DG Chest Port 1 View  Result Date: 11/14/2019 CLINICAL DATA:  Hypoxia. EXAM: PORTABLE CHEST 1 VIEW COMPARISON:  Radiograph 11/12/2019.  CT  06/14/2019 FINDINGS: Current is difficult to define, however the endotracheal tube tip appears to be 19 mm from the carina. Enteric tube in place with tip below the diaphragm not included in the field of view. Left internal jugular central venous catheter tip in the region of the upper SVC. Stable cardiomegaly. Hazy bilateral lung base opacities likely representing pleural effusions. Vascular congestion. Slight improvement in pulmonary edema. No pneumothorax. Overlying artifact projects over the upper left chest. IMPRESSION: 1. Stable cardiomegaly. Vascular congestion. Improving pulmonary edema. 2. Bilateral pleural effusions. 3. Stable support apparatus. Electronically Signed   By: Keith Rake M.D.   On: 11/14/2019 22:16   DG CHEST PORT 1 VIEW  Result Date: 11/12/2019 CLINICAL DATA:  Hypoxia EXAM: PORTABLE CHEST 1 VIEW COMPARISON:  November 11, 2019 FINDINGS: The ETT terminates 2.6 cm above the carina in good position. The left central line terminates in the SVC. The NG tube is not well seen distally due to poor penetration. No pneumothorax. Bilateral layering effusions with underlying atelectasis are stable. Mild pulmonary venous congestion/mild edema not excluded. Stable cardiomegaly. The hila and mediastinum are unchanged. IMPRESSION: 1. Support apparatus as above. 2. Small bilateral pleural effusions with underlying atelectasis. Suggested mild pulmonary venous congestion/mild edema. Electronically Signed   By: Dorise Bullion III M.D   On: 11/12/2019 07:33   DG CHEST PORT 1 VIEW  Result Date: 11/11/2019 CLINICAL DATA:  Hypoxia EXAM: PORTABLE CHEST 1 VIEW COMPARISON:  Radiograph 11/13/2019 FINDINGS: Low positioning of the endotracheal tube closely approximating the level of the carina. Recommend retraction approximately 3 cm to the mid trachea. Transesophageal tube tip terminates below the level of imaging, beyond the GE junction. Left IJ approach central venous catheter tip terminates at the  confluence of the SVC and brachiocephalic vein. Telemetry leads and external support devices overlie the chest. Stable cardiomegaly without significant interval change. Bilateral interstitial opacities with more  hazy and confluent opacity in the medial lung bases. Suspect a right pleural effusion. No visible left effusion though the costophrenic sulcus is collimated. No discernible pneumothorax. No acute osseous or soft tissue abnormality. IMPRESSION: 1. Low positioning of the endotracheal tube closely approximating the level of the carina. Recommend retraction approximately 3 cm to the mid trachea. 2. Additional lines and tubes as above. 3. Stable cardiomegaly with bilateral interstitial opacities and more hazy and confluent opacity in the medial lung bases. Could reflect a combination of pulmonary edema, atelectasis and underlying airspace disease. 4. Small right effusion. These results will be called to the ordering clinician or representative by the Radiologist Assistant, and communication documented in the PACS or [employer]. Electronically Signed   By: Lovena Le M.D.   On: 11/11/2019 20:46   DG Chest Portable 1 View  Result Date: 11/02/2019 CLINICAL DATA:  Tube placement. EXAM: PORTABLE CHEST 1 VIEW COMPARISON:  11/19/2019. FINDINGS: Endotracheal tube noted with tip 4 cm above the carina. NG tube noted with tip below left hemidiaphragm. Severe cardiomegaly again noted without interim change. Bilateral interstitial prominence suggesting interstitial edema again noted. No interim change. Small right pleural effusion cannot be excluded. Left costophrenic angle not imaged. No pneumothorax. IMPRESSION: 1. Endotracheal tube noted with tip 4 cm above the carina. NG tube noted tip below left hemidiaphragm. 2. Severe cardiomegaly again noted without interim change. Bilateral interstitial prominence suggesting interstitial edema again noted. Similar findings noted on prior exam. Small right pleural effusion  cannot be excluded. Left costophrenic angle not imaged. Electronically Signed   By: Marcello Moores  Register   On: 11/19/2019 09:35   DG Chest Port 1 View  Result Date: 10/26/2019 CLINICAL DATA:  Shortness of breath. EXAM: PORTABLE CHEST 1 VIEW COMPARISON:  Radiograph yesterday.  CT 06/14/2019 FINDINGS: Stable cardiomegaly. Vascular congestion. Stable streaky opacity at the right lung base. No pneumothorax. Left costophrenic angle is not entirely included in the field of view. IMPRESSION: 1. Stable cardiomegaly with vascular congestion. 2. Unchanged streaky opacity at the right lung base, may be atelectasis or pneumonia. 3. Technically limited exam due to habitus and portable technique. Electronically Signed   By: Keith Rake M.D.   On: 11/07/2019 00:56   EEG adult  Result Date: 11/11/2019 Lora Havens, MD     11/11/2019 11:23 AM Patient Name: Kara Vincent MRN: [id number] Epilepsy Attending: Lora Havens Referring Physician/Provider: Dr. [name] Date: 11/11/2019 Duration: 26.38 minutes Patient history: 54 year old morbidly obese female who was admitted for respiratory distress, currently intubated and then noted to have seizure-like activity.  EEG evaluate for seizures. Level of alertness: lethargic AEDs during EEG study: Propofol, Keppra Technical aspects: This EEG study was done with scalp electrodes positioned according to the 10-20 International system of electrode placement. Electrical activity was acquired at a sampling rate of 500Hz  and reviewed with a high frequency filter of 70Hz  and a low frequency filter of 1Hz . EEG data were recorded continuously and digitally stored. Description: EEG showed continuous generalized rhythmic 2 to 3 Hz delta slowing admixed with 13 to 15 Hz generalized beta activity.  Patient was noted to have right arm tremor-like movement as well as left arm movement (Per EEG tech annotation, difficult to visualize on camera). Concomitant EEG before, during and after  the event did not show any EEG change to suggest seizure.  Hyperventilation and photic stimulation were not performed.   Of note, study was technically difficult as due to patient's body habitus tech was unable  to place O2, P8 and P4 electrodes. ABNORMALITY -Continuous slow, generalized IMPRESSION: This technically difficult study is suggestive of severe diffuse encephalopathy, nonspecific to etiology but likely related to sedation.  No seizures or epileptiform discharges were seen throughout the recording. Patient was noted to have right upper extremity tremor-like movement as well as left upper extremity movement which was difficult to visualize on camera without concomitant EEG change and was most likely not epileptic If concern for interictal-ictal activity persists, consider repeating study. Lora Havens    Microbiology Recent Results (from the past 240 hour(s))  SARS Coronavirus 2 by RT PCR (hospital order, performed in [facility] hospital lab) Nasopharyngeal Nasopharyngeal Swab     Status: None   Collection Time: 11/09/19  6:42 PM   Specimen: Nasopharyngeal Swab  Result Value Ref Range Status   SARS Coronavirus 2 NEGATIVE NEGATIVE Final    Comment: (NOTE) SARS-CoV-2 target nucleic acids are NOT DETECTED.  The SARS-CoV-2 RNA is generally detectable in upper and lower respiratory specimens during the acute phase of infection. The lowest concentration of SARS-CoV-2 viral copies this assay can detect is 250 copies / mL. A negative result does not preclude SARS-CoV-2 infection and should not be used as the sole basis for treatment or other patient management decisions.  A negative result may occur with improper specimen collection / handling, submission of specimen other than nasopharyngeal swab, presence of viral mutation(s) within the areas targeted by this assay, and inadequate number of viral copies (<250 copies / mL). A negative result must be combined with clinical observations,  patient history, and epidemiological information.  Fact Sheet for Patients:   StrictlyIdeas.no  Fact Sheet for Healthcare Providers: BankingDealers.co.za  This test is not yet approved or  cleared by the [place] FDA and has been authorized for detection and/or diagnosis of SARS-CoV-2 by FDA under an Emergency Use Authorization (EUA).  This EUA will remain in effect (meaning this test can be used) for the duration of the COVID-19 declaration under Section 564(b)(1) of the Act, 21 U.S.C. section 360bbb-3(b)(1), unless the authorization is terminated or revoked sooner.  Performed at [facility], [place] [address], [place], [place] [address]   Urine Culture     Status: Abnormal   Collection Time: 11/13/2019  2:30 AM   Specimen: Urine, Catheterized  Result Value Ref Range Status   Specimen Description URINE, CATHETERIZED  Final   Special Requests   Final    NONE Performed at [facility], [place] [address] [place], [place] [address]    Culture MULTIPLE SPECIES PRESENT, SUGGEST RECOLLECTION (A)  Final   Report Status 11/11/2019 FINAL  Final  Culture, blood (Routine X 2) w Reflex to ID Panel     Status: None   Collection Time: 11/14/2019  2:30 AM   Specimen: BLOOD  Result Value Ref Range Status   Specimen Description BLOOD LEFT SHOULDER  Final   Special Requests   Final    BOTTLES DRAWN AEROBIC AND ANAEROBIC Blood Culture adequate volume   Culture   Final    NO GROWTH 5 DAYS Performed at [facility], [place] [address] [place], [place] [address]    Report Status 11/15/2019 FINAL  Final  Culture, blood (routine x 2)     Status: None   Collection Time: 10/24/2019 12:56 PM   Specimen: BLOOD  Result Value Ref Range Status   Specimen Description BLOOD RIGHT ANTECUBITAL  Final   Special Requests   Final  BOTTLES DRAWN AEROBIC AND ANAEROBIC Blood Culture adequate volume   Culture   Final    NO GROWTH 5 DAYS Performed at  [facility], [place] [address], [place], [place] [address]    Report Status 11/15/2019 FINAL  Final  MRSA PCR Screening     Status: Abnormal   Collection Time: 11/17/2019  1:37 PM   Specimen: Nasal Mucosa; Nasopharyngeal  Result Value Ref Range Status   MRSA by PCR POSITIVE (A) NEGATIVE Final    Comment:        The GeneXpert MRSA Assay (FDA approved for NASAL specimens only), is one component of a comprehensive MRSA colonization surveillance program. It is not intended to diagnose MRSA infection nor to guide or monitor treatment for MRSA infections. RESULT CALLED TO, READ BACK BY AND VERIFIED WITH: A,GOLDSMITH @1607  11/01/2019 EB Performed at [place] [address], [place], [place] [address]   Urine culture     Status: None   Collection Time: 11/11/19 12:02 PM   Specimen: Urine, Random  Result Value Ref Range Status   Specimen Description URINE, RANDOM  Final   Special Requests NONE  Final   Culture   Final    NO GROWTH Performed at [facility], [place] [address], [place], [place] [address]    Report Status 11/12/2019 FINAL  Final  Culture, blood (routine x 2)     Status: None (Preliminary result)   Collection Time: 11/15/19 11:13 AM   Specimen: BLOOD LEFT HAND  Result Value Ref Range Status   Specimen Description BLOOD LEFT HAND  Final   Special Requests   Final    BOTTLES DRAWN AEROBIC ONLY Blood Culture results may not be optimal due to an inadequate volume of blood received in culture bottles   Culture   Final    NO GROWTH < 24 HOURS Performed at [facility], [place] [address] [place], [place] [address]    Report Status PENDING  Incomplete  SARS Coronavirus 2 by RT PCR (hospital order, performed in [facility] hospital lab) Nasopharyngeal Nasopharyngeal Swab     Status: None   Collection Time: 11/15/19  1:49 PM   Specimen: Nasopharyngeal Swab  Result Value Ref Range Status   SARS Coronavirus 2 NEGATIVE NEGATIVE Final    Comment:  (NOTE) SARS-CoV-2 target nucleic acids are NOT DETECTED.  The SARS-CoV-2 RNA is generally detectable in upper and lower respiratory specimens during the acute phase of infection. The lowest concentration of SARS-CoV-2 viral copies this assay can detect is 250 copies / mL. A negative result does not preclude SARS-CoV-2 infection and should not be used as the sole basis for treatment or other patient management decisions.  A negative result may occur with improper specimen collection / handling, submission of specimen other than nasopharyngeal swab, presence of viral mutation(s) within the areas targeted by this assay, and inadequate number of viral copies (<250 copies / mL). A negative result must be combined with clinical observations, patient history, and epidemiological information.  Fact Sheet for Patients:   StrictlyIdeas.no  Fact Sheet for Healthcare Providers: BankingDealers.co.za  This test is not yet approved or  cleared by the [place] FDA and has been authorized for detection and/or diagnosis of SARS-CoV-2 by FDA under an Emergency Use Authorization (EUA).  This EUA will remain in effect (meaning this test can be used) for the duration of the COVID-19 declaration under Section 564(b)(1) of the Act, 21 U.S.C. section 360bbb-3(b)(1), unless the  authorization is terminated or revoked sooner.  Performed at [facility], [place] [address] [place], [place] [address]     Lab Basic Metabolic Panel: Recent Labs  Lab 11/13/2019 1200 11/13/2019 1458 11/11/19 0539 11/11/19 1707 11/12/19 0311 11/12/19 1650 11/12/19 1819 11/13/19 0537 11/13/19 1714 11/14/19 0558 11/14/19 1608 11/14/19 2252 11/15/19 0012 11/15/19 0400 11/15/19 1152 2019/11/24 0428  NA 144   < >   < >  --  142  --    < > 145   < > 144   < > 141 143 143 143 142  K 4.6   < >   < >  --  3.9  --    < > 4.1   < > 3.7   < > 3.6 3.7 3.6 3.8 5.4*  CL 99   < >    < >  --  97*  --   --  97*  --  96*  --   --  99 98  --   --   CO2 32   < >   < >  --  32  --   --  37*  --  34*  --   --  33* 31  --   --   GLUCOSE 134*   < >   < >  --  152*  --   --  104*  --  106*  --   --  135* 132*  --   --   BUN 29*   < >   < >  --  40*  --   --  56*  --  60*  --   --  58* 53*  --   --   CREATININE 1.83*   < >   < >  --  1.88*  --   --  2.14*  --  2.00*  --   --  2.03* 1.97*  --   --   CALCIUM 7.8*   < >   < >  --  7.7*  --   --  7.6*  --  8.0*  --   --  8.1* 8.2*  --   --   MG 2.4   < >  --  2.4 2.2 2.4  --  2.3  --   --   --   --   --  2.4  --   --   PHOS 4.9*  --   --  3.7 4.1 5.8*  --  5.9*  --   --   --   --   --   --   --   --    < > = values in this interval not displayed.   Liver Function Tests: Recent Labs  Lab 11/19/2019 0059 11/17/2019 1200 11/15/19 0012  AST 163* 122* 13*  ALT 143* 132* 32  ALKPHOS 84 75 47  BILITOT 1.3* 1.6* 1.8*  PROT 7.5 6.5 6.5  ALBUMIN 3.6 3.2* 2.8*   Recent Labs  Lab 11/07/2019 1200  LIPASE 27  AMYLASE 37   No results for input(s): AMMONIA in the last 168 hours. CBC: Recent Labs  Lab 11/22/2019 0059 11/14/2019 1200 11/12/19 0311 11/12/19 1819 11/13/19 0537 11/13/19 1714 11/14/19 0558 11/14/19 1608 11/14/19 2252 11/15/19 0012 11/15/19 0400 11/15/19 1152 11/24/19 0428  WBC 16.1*   < > 10.4  --  11.4*  --  16.5*  --   --  33.8* 38.9*  --   --  NEUTROABS 12.9*  --   --   --   --   --   --   --   --   --   --   --   --   HGB 12.3   < > 10.1*   < > 9.6*   < > 10.6*   < > 11.9* 11.0* 11.2* 13.3 13.6  HCT 47.4*   < > 35.3*   < > 35.1*   < > 37.8   < > 35.0* 40.1 41.5 39.0 40.0  MCV 100.4*   < > 93.9  --  95.6  --  95.2  --   --  97.1 97.6  --   --   PLT 483*   < > 217  --  198  --  212  --   --  217 223  --   --    < > = values in this interval not displayed.   Cardiac Enzymes: No results for input(s): CKTOTAL, CKMB, CKMBINDEX, TROPONINI in the last 168 hours. Sepsis Labs: Recent Labs  Lab 11/03/2019 0059  11/07/2019 0236 11/20/2019 1200 11/11/19 0539 11/13/19 0537 11/14/19 0558 11/15/19 0012 11/15/19 0400  PROCALCITON  --   --  0.47  --   --   --   --   --   WBC   < >  --  14.7*   < > 11.4* 16.5* 33.8* 38.9*  LATICACIDVEN  --  1.3 2.3*  --   --   --  1.5  --    < > = values in this interval not displayed.    Procedures/Operations  N/A   Jacky Kindle 11/23/19, 11:16 AM

## 2019-11-23 NOTE — Progress Notes (Signed)
Pt belongings placed with on gurney with patient to morgue.  Patient belongings consist of clothes, cell phone and personal care items.

## 2019-11-23 DEATH — deceased

## 2019-11-25 IMAGING — CR DG CHEST 2V
2 series · 2 of 2 positions shown · non-contrast
Comparison: 01/21/2017

CLINICAL DATA: Cough and intermittent shortness of breath.
Left-sided chest pain.

EXAM:
CHEST  2 VIEW

[w chest pa]
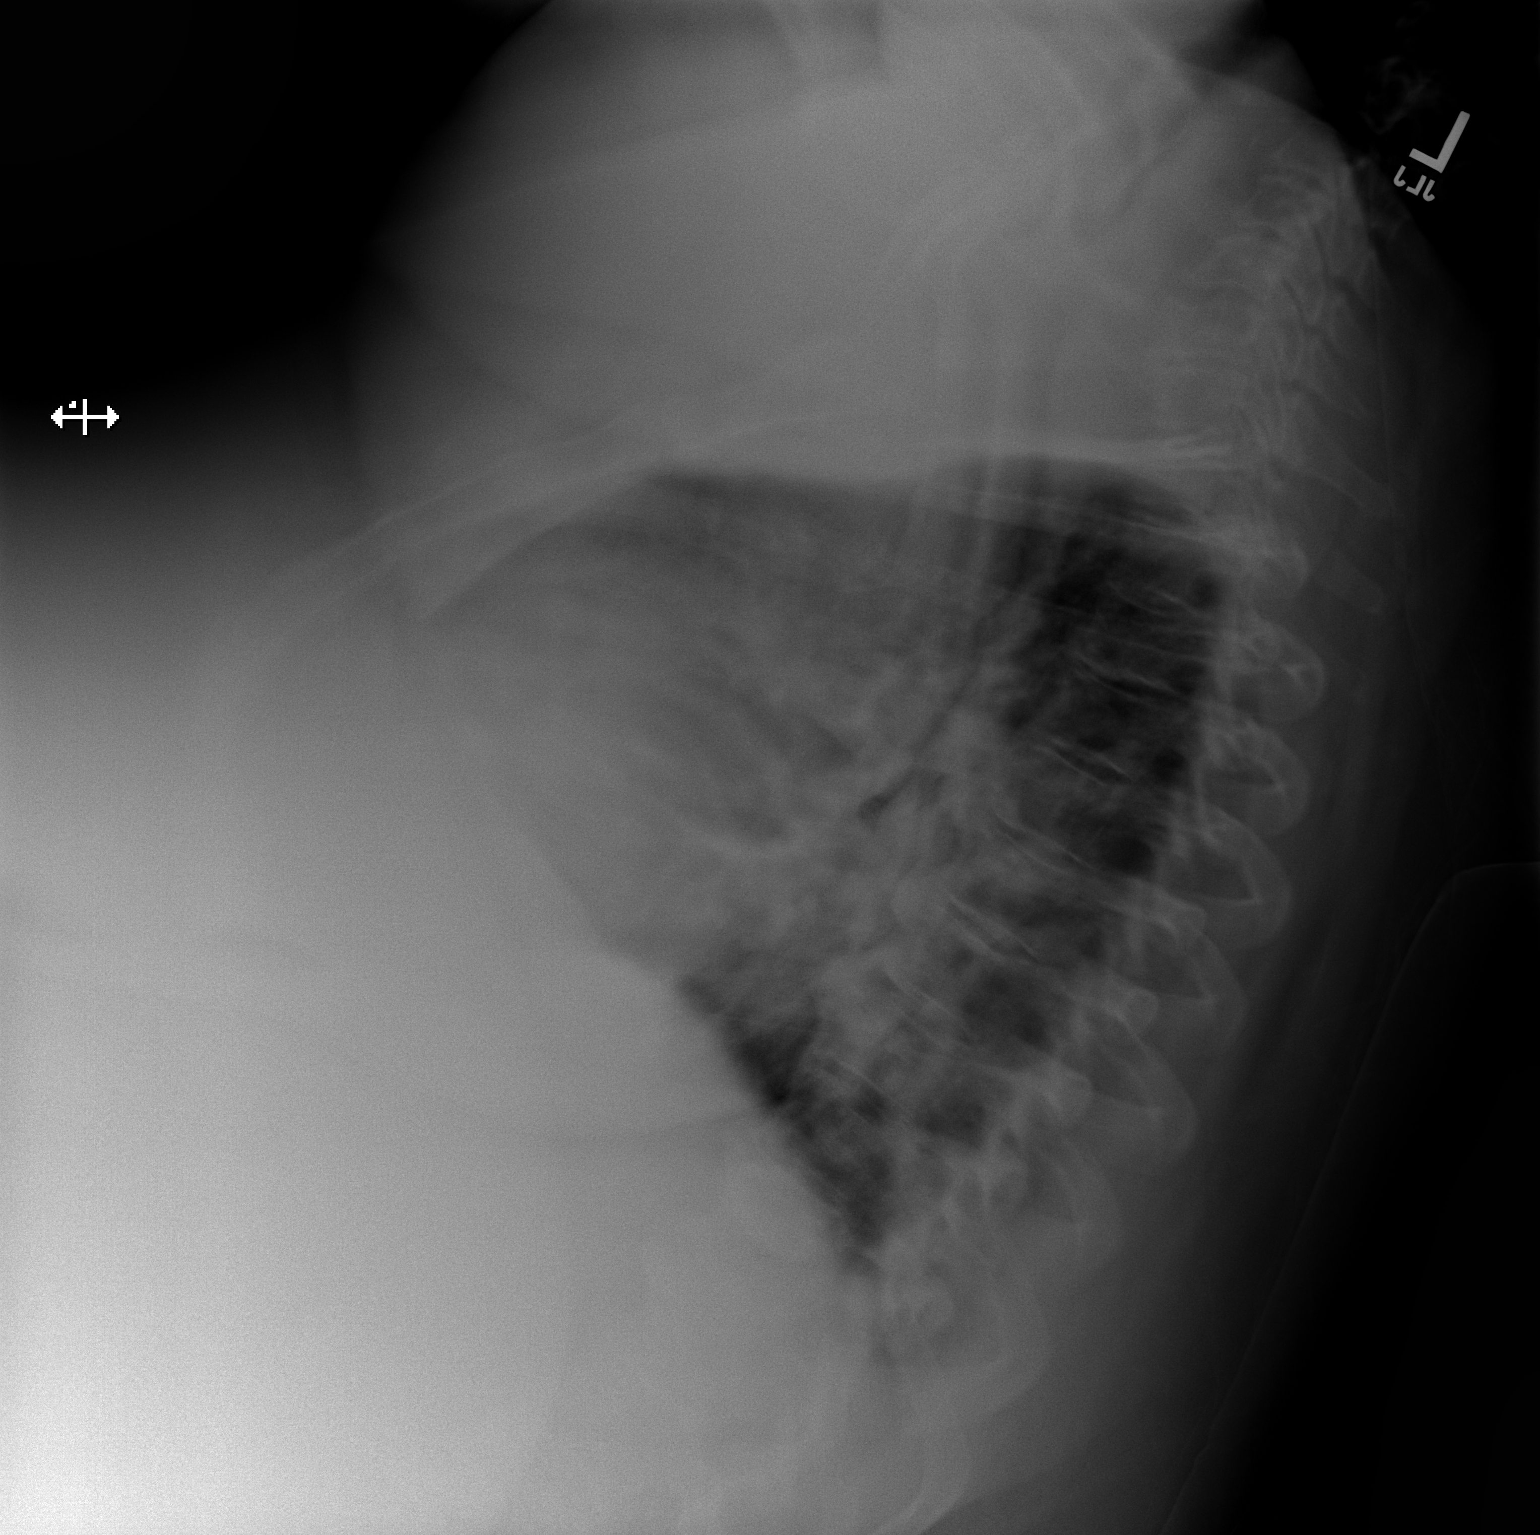

[x chest ap]
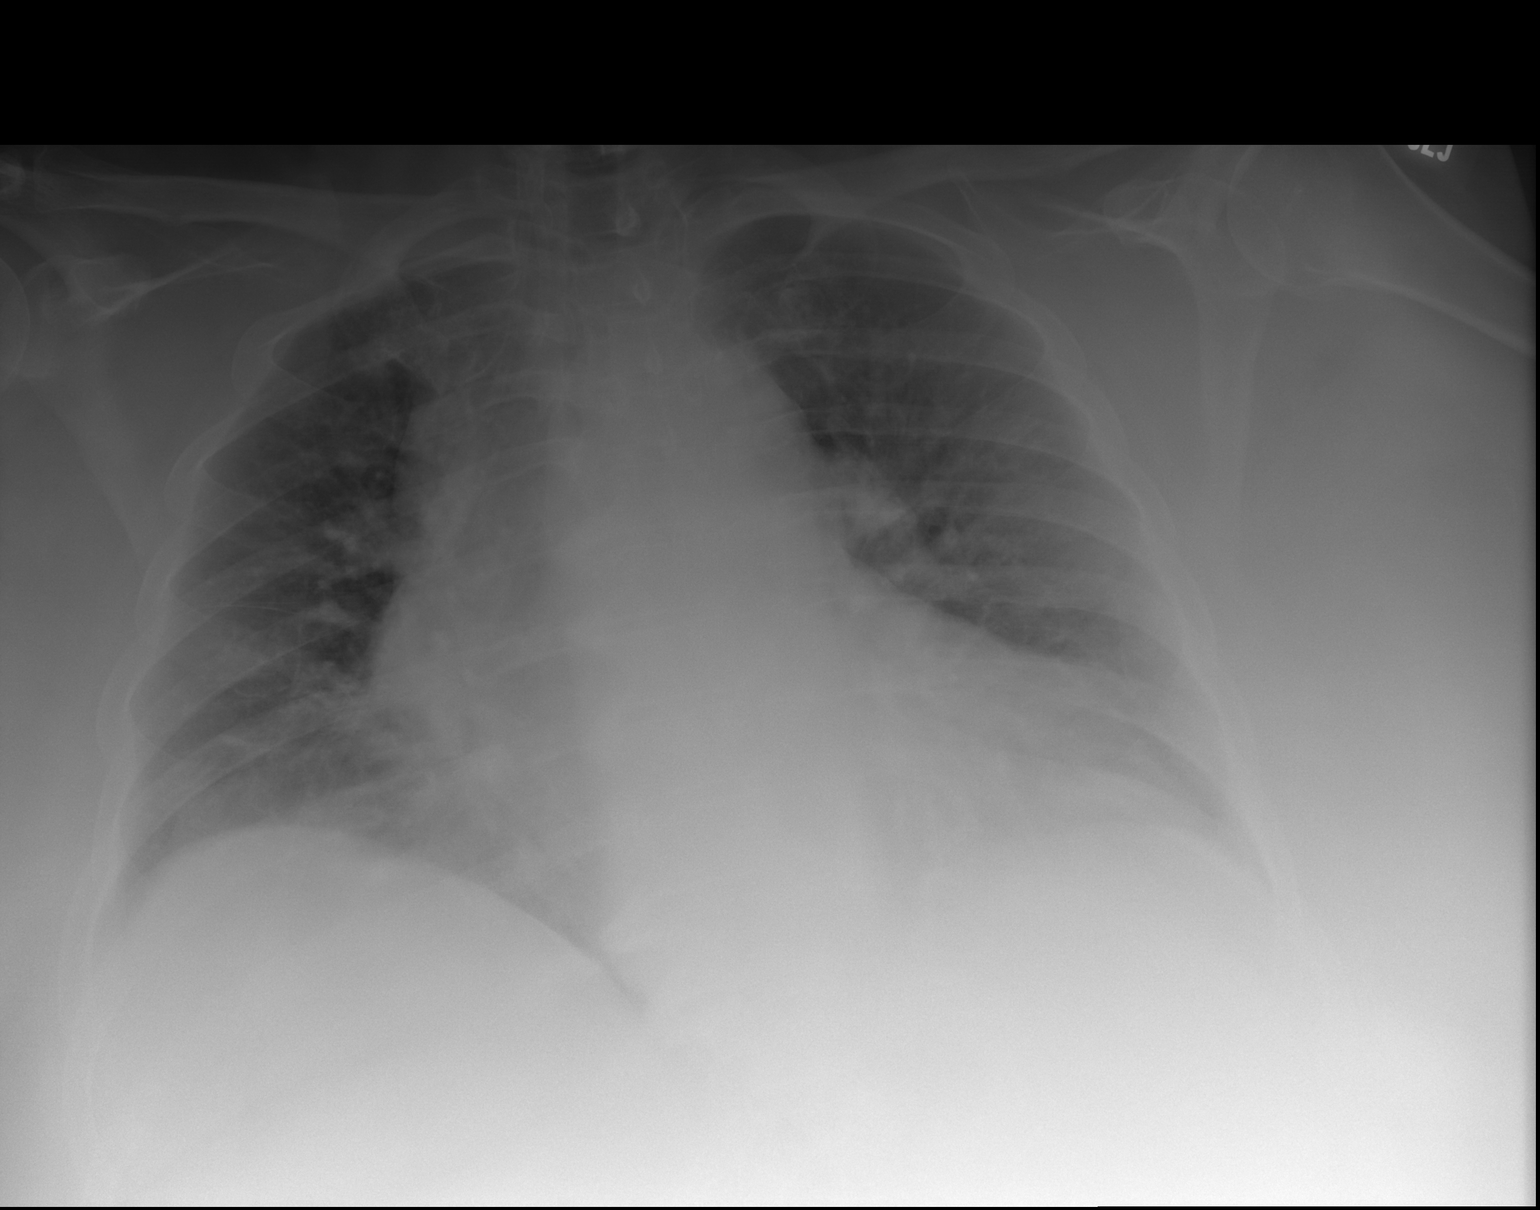

[2 of 2 positions shown; findings below may reference images not displayed]

FINDINGS: Unchanged cardiomegaly and mediastinal contours. Unchanged vascular
congestion. Increasing streaky bibasilar atelectasis. No pleural
effusion or pneumothorax. Technically limited exam due to soft
tissue attenuation from body habitus.
IMPRESSION: 1. Unchanged cardiomegaly and vascular congestion.
2. Increasing bibasilar atelectasis.

## 2020-01-23 IMAGING — CR DG CHEST 2V
2 series · 2 of 2 positions shown · non-contrast
Comparison: 05/08/2017

CLINICAL DATA: Short of breath

EXAM:
CHEST - 2 VIEW

[x chest ap]
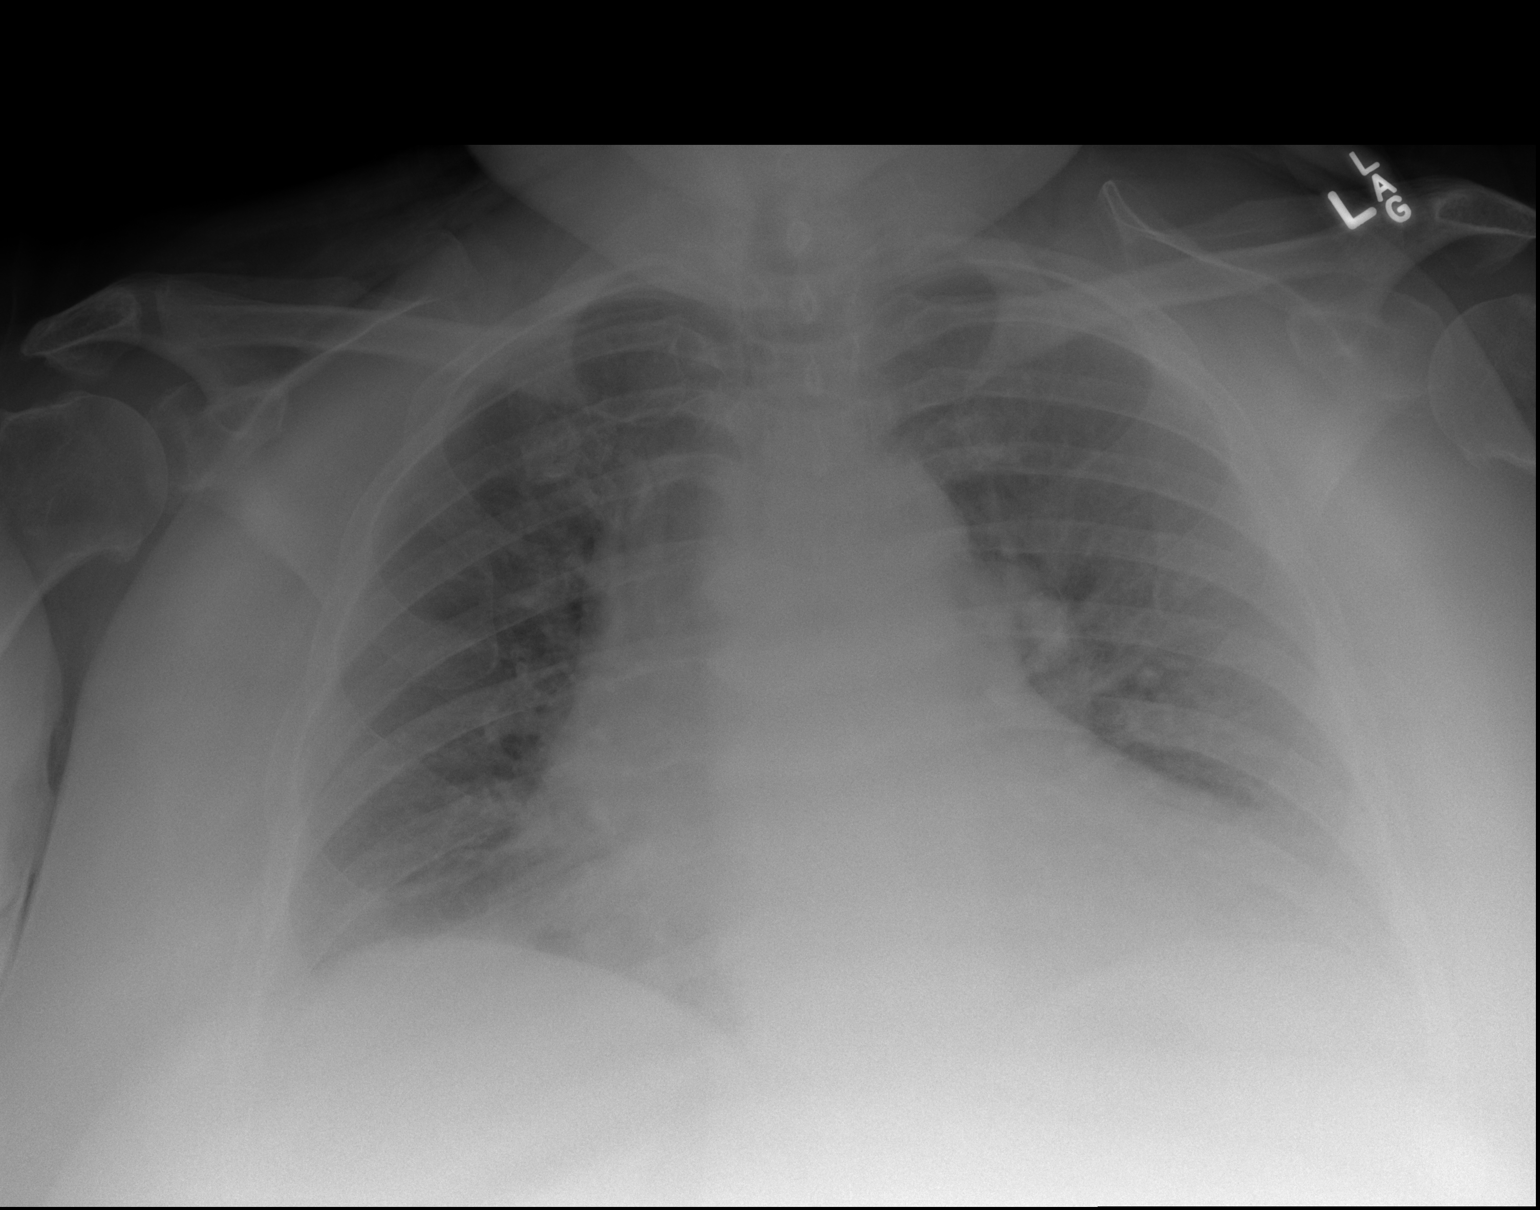

[w chest lat]
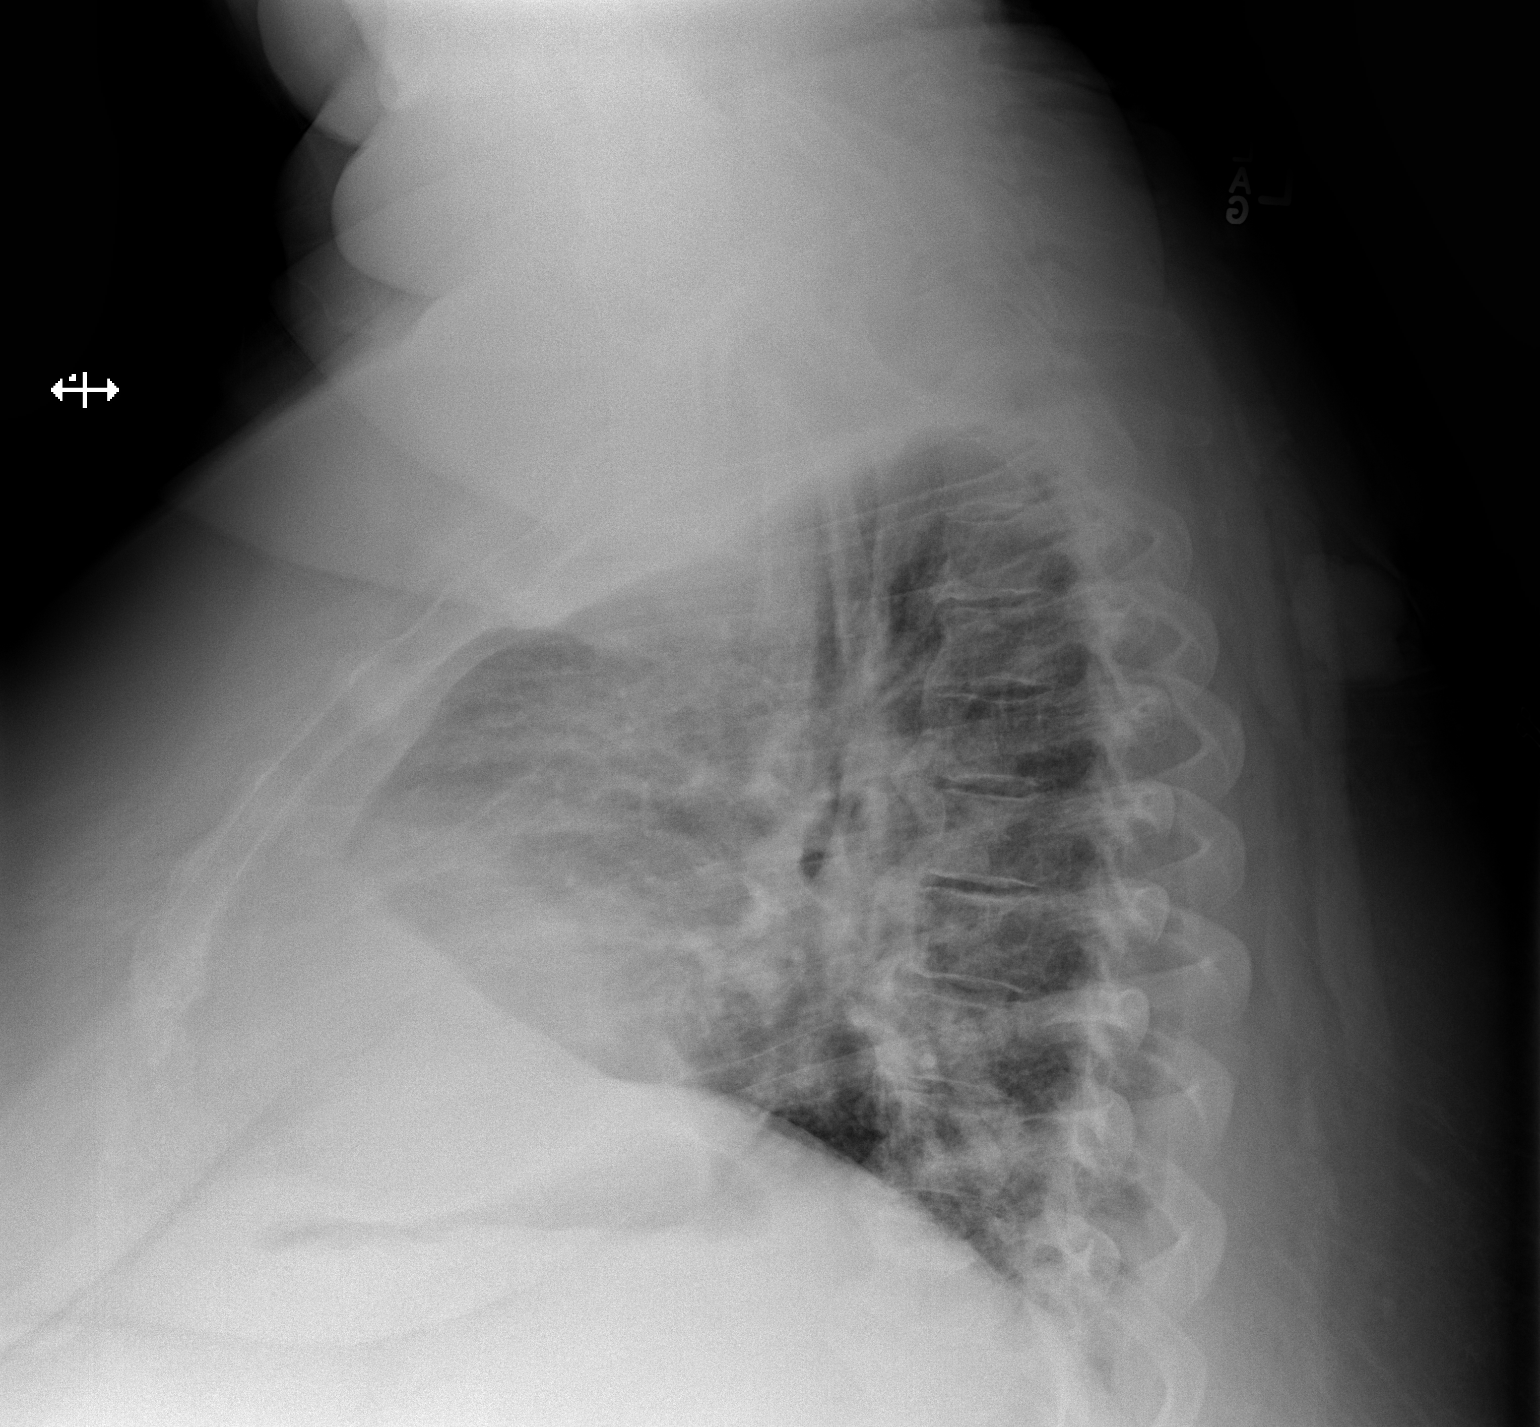

[2 of 2 positions shown; findings below may reference images not displayed]

FINDINGS: No significant pleural effusion. Cardiomegaly with vascular
congestion. No focal consolidation. No pneumothorax.
IMPRESSION: Cardiomegaly with vascular congestion. No significant change
compared to prior.

## 2020-01-25 IMAGING — DX DG KNEE 1-2V*R*
2 series · 2 of 2 positions shown · non-contrast
Comparison: Plain films of the right knee 01/02/2008.

CLINICAL DATA: Chronic right knee pain.  No known injury.

EXAM:
RIGHT KNEE - 1-2 VIEW

[knee ap]
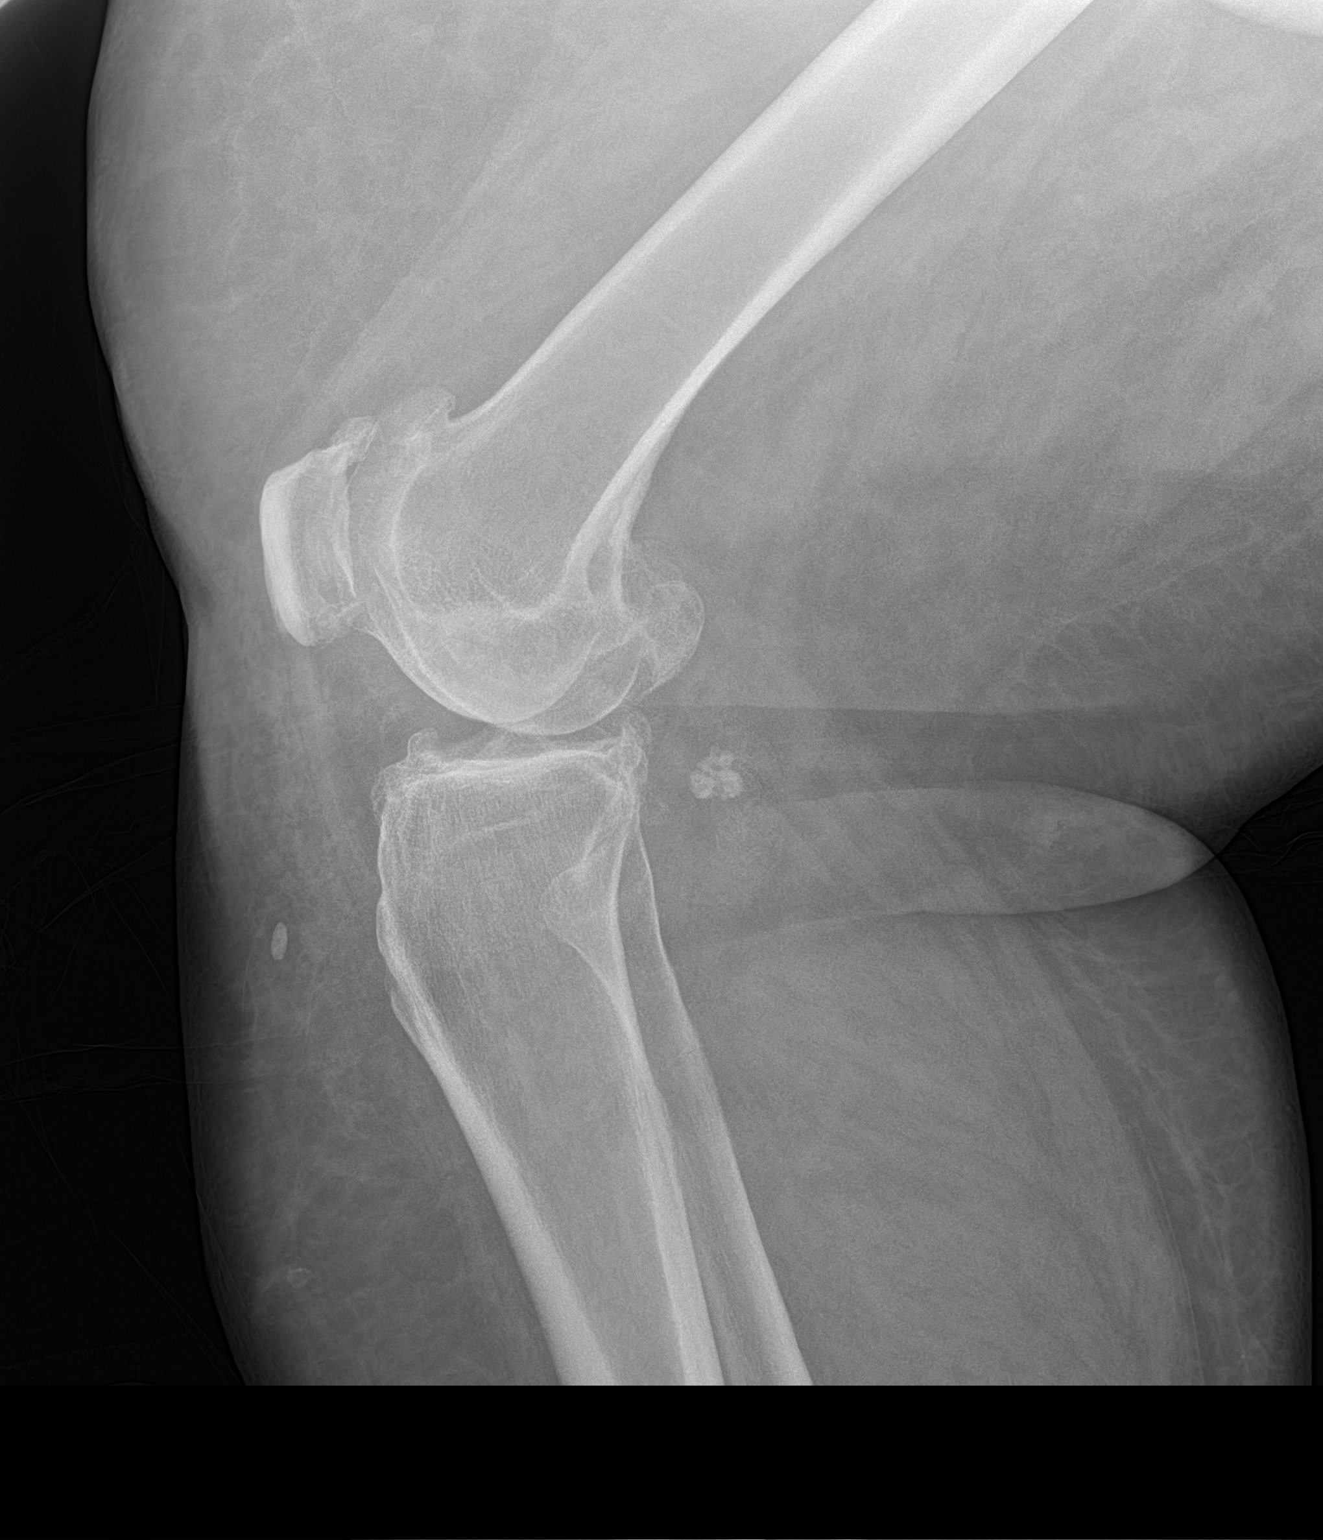

[knee lat]
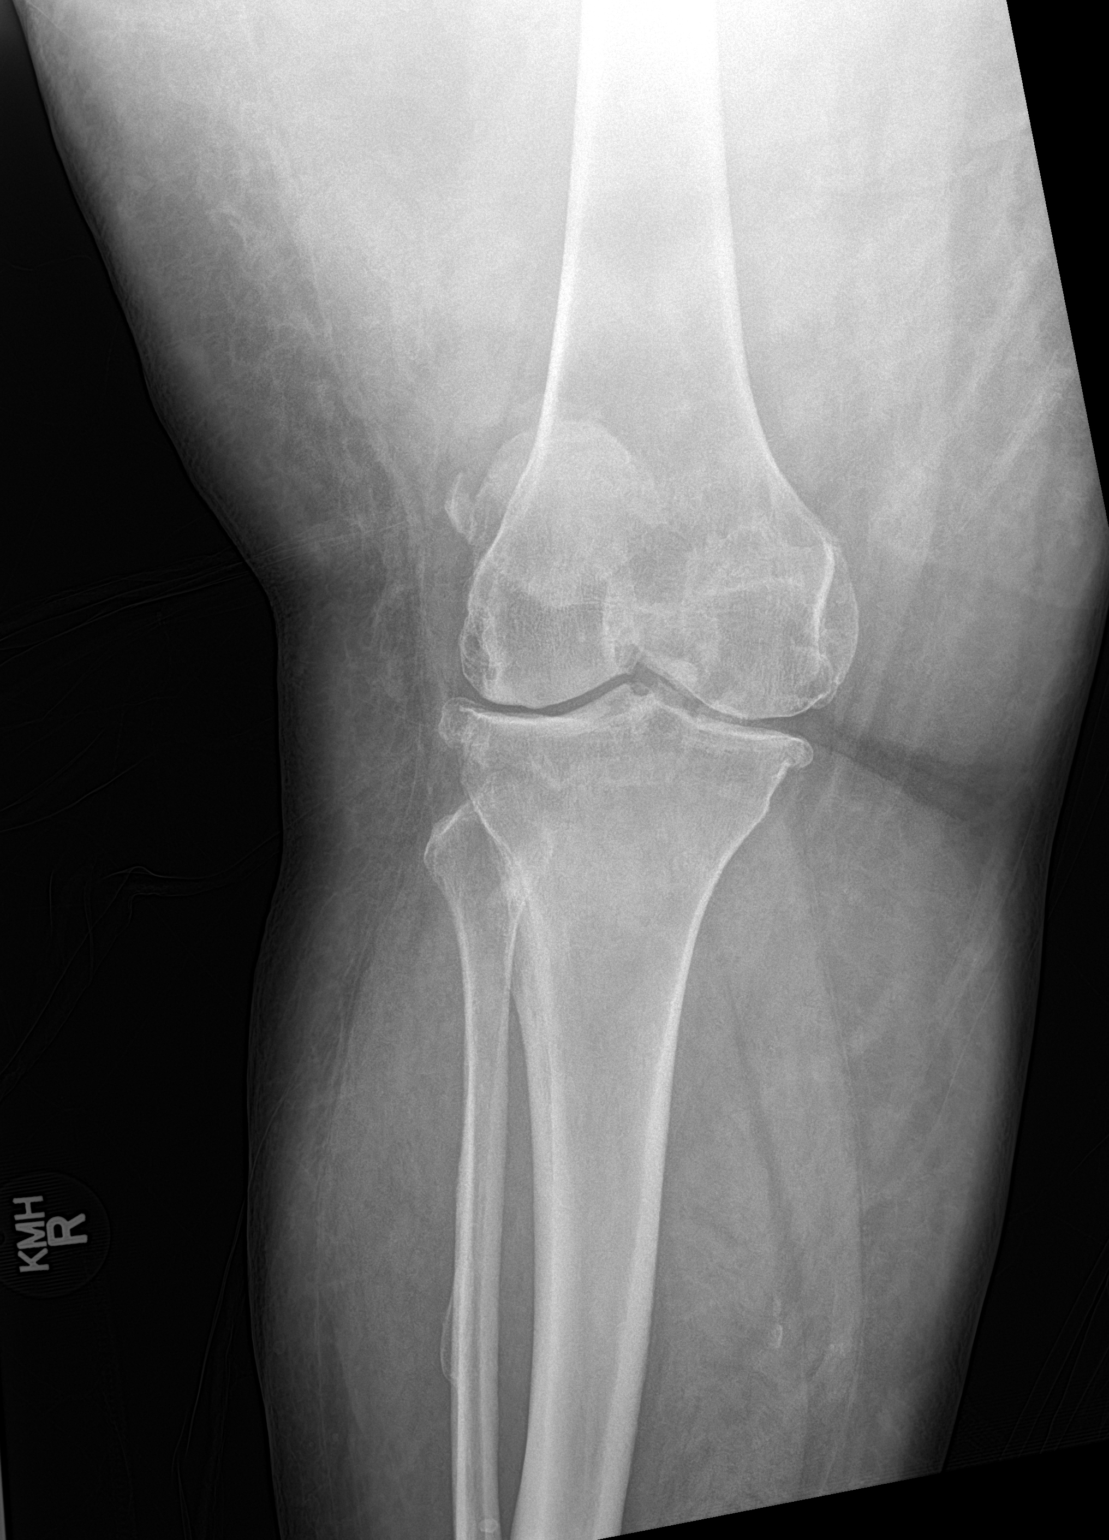

[2 of 2 positions shown; findings below may reference images not displayed]

FINDINGS: Advanced for age tricompartmental osteoarthritis shows some
progression since the prior examination. No acute bony or joint
abnormality is seen. No joint effusion. A lobulated calcification
measuring 1.2 cm in diameter posterior to the proximal tibia is
likely a loose body in a Baker's cyst. Soft tissues are
unremarkable.
IMPRESSION: No acute finding.

Advanced for age tricompartmental osteoarthritis has worsened since
6118.

Calcification posterior to the knee is likely a loose body in a
Baker's cyst.

## 2020-01-27 IMAGING — CT CT CERVICAL SPINE W/O CM
4 of 7 series · 14 of 33 positions shown, 15 images · non-contrast
Comparison: Head CT 01/22/2017

CLINICAL DATA: Patient fell out of wheelchair hitting her head.

EXAM:
CT HEAD WITHOUT CONTRAST
CT CERVICAL SPINE WITHOUT CONTRAST
TECHNIQUE: Multidetector CT imaging of the head and cervical spine was
performed following the standard protocol without intravenous
contrast. Multiplanar CT image reconstructions of the cervical spine
were also generated.

[Series 9: c spine soft · axial · 0.42mm/px · z∈[-508,-396]mm · 4 of 94 slices shown]
[im 19/94  soft-tissue]
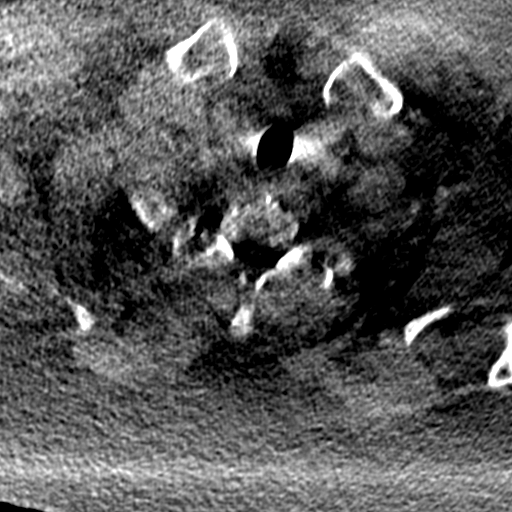
[im 38/94  soft-tissue]
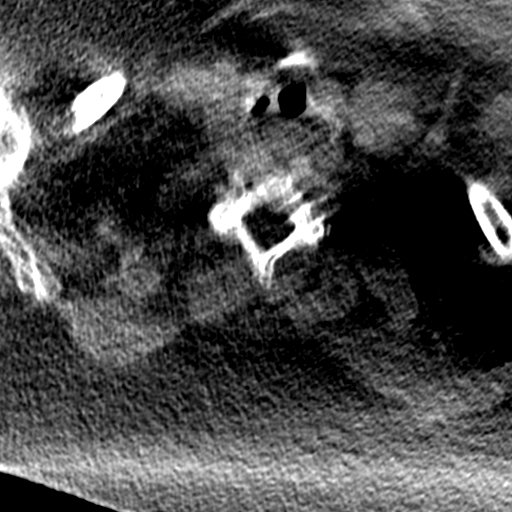
[im 56/94  soft-tissue]
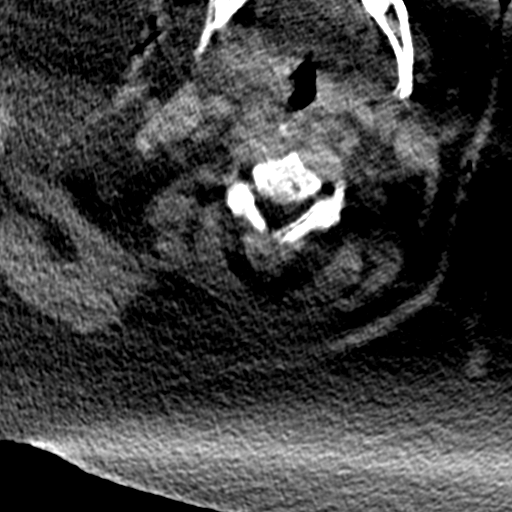
[im 75/94  soft-tissue]
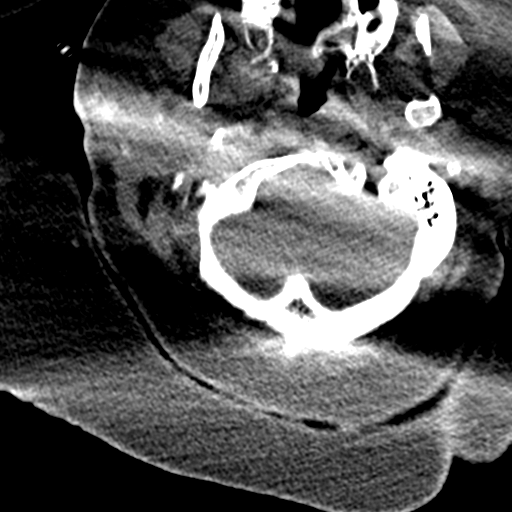

[Series 11: orthogonal bone · axial · 0.33mm/px · z∈[-519,-402]mm · 4 of 99 slices shown, 5 images]
[im 20/99  soft-tissue]
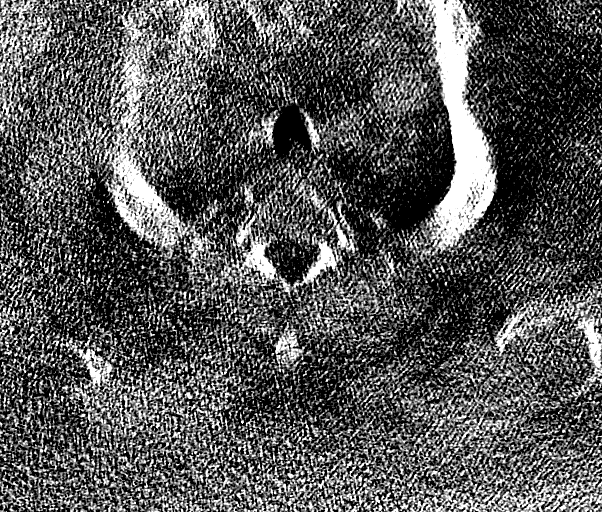
[im 20/99  bone]
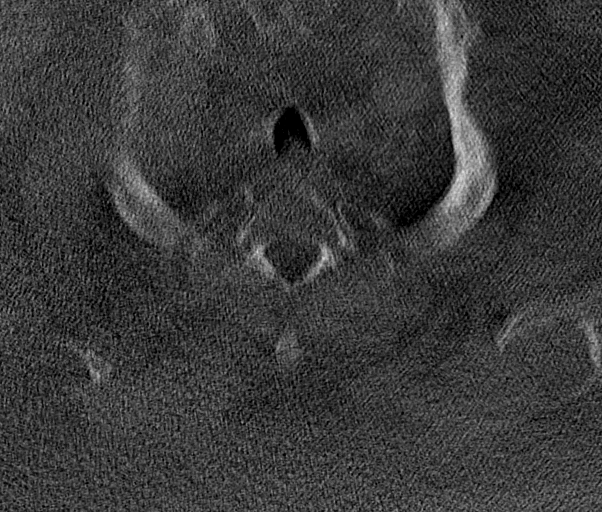
[im 40/99  bone]
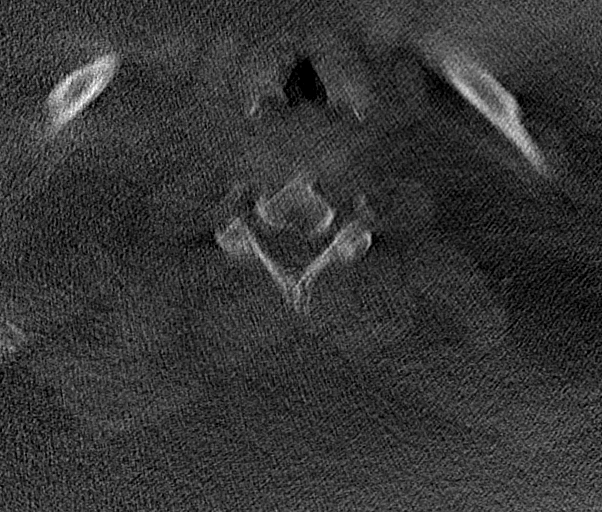
[im 59/99  bone]
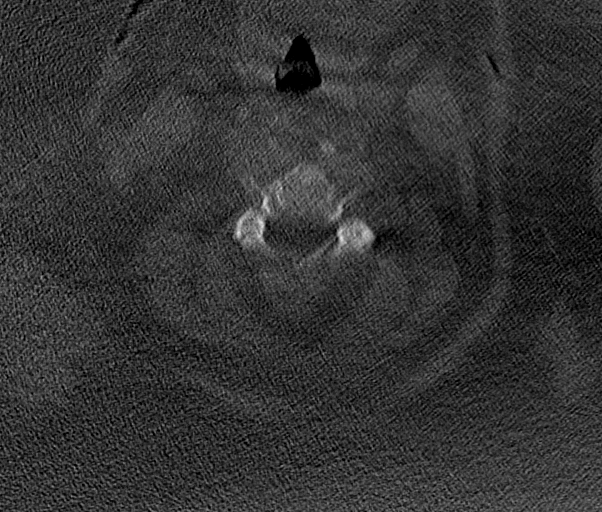
[im 79/99  bone]
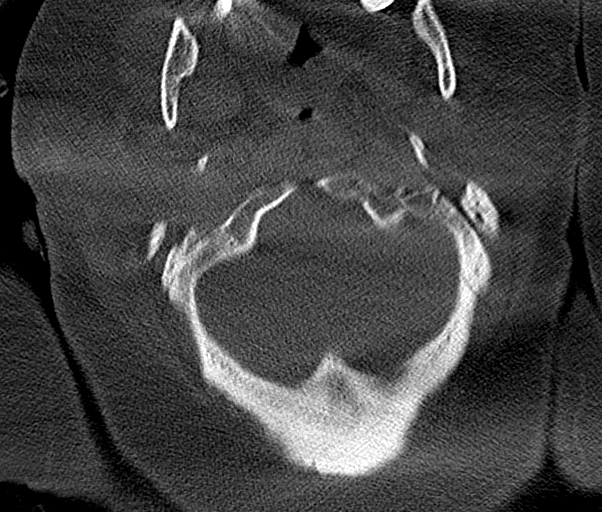

[Series 12: coronal bone · coronal · 0.38mm/px · 1 of 86 slices shown]
[im 43/86  bone]
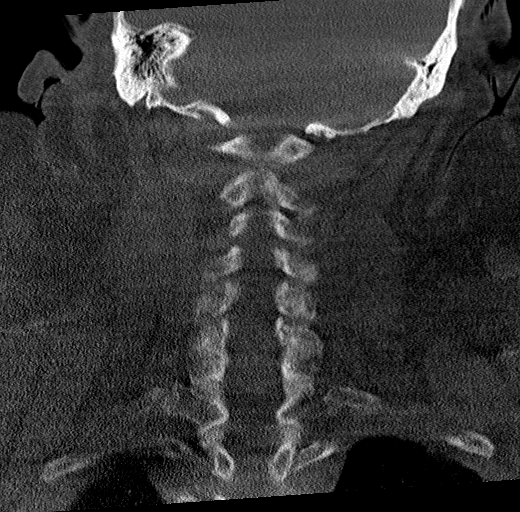

[Series 13: sagittal bone · sagittal · 0.33mm/px · 5 of 101 slices shown]
[im 15/101  bone]
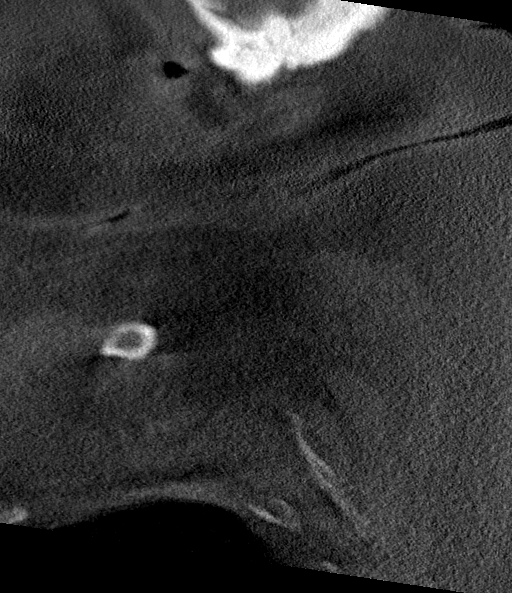
[im 29/101  bone]
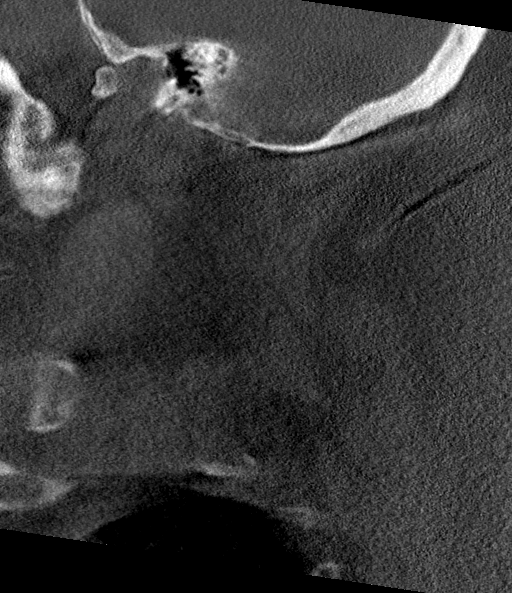
[im 43/101  bone]
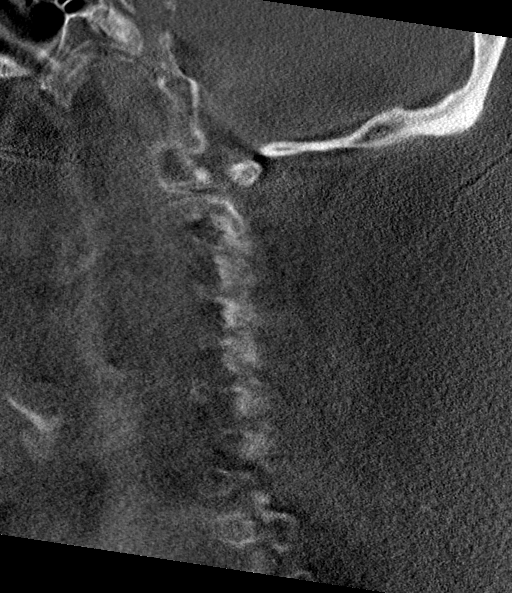
[im 58/101  bone]
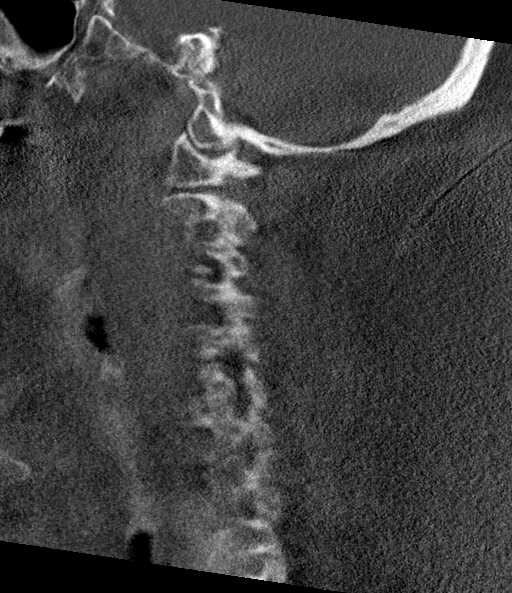
[im 72/101  bone]
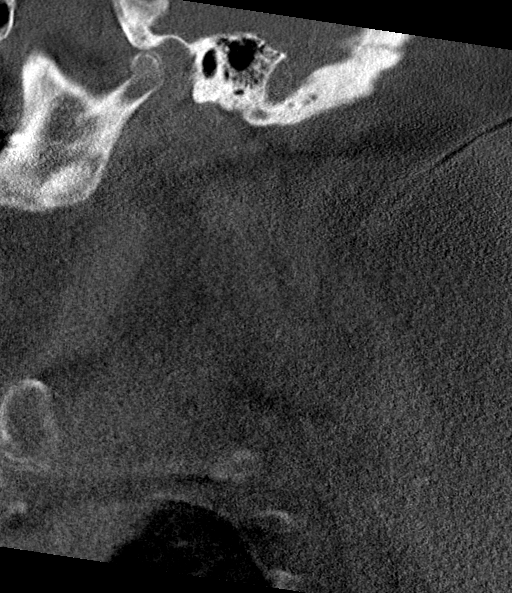

[14 of 33 positions shown; findings below may reference images not displayed]

FINDINGS: CT HEAD FINDINGS

Limited due to streak artifacts from the patient's shoulders and
patient body habitus.

Brain: No acute intracranial hemorrhage, edema, midline shift nor
extra-axial fluid collections noted. No hydrocephalus. No large
vascular territory infarct.

Vascular: No hyperdense vessel sign.

Skull: No skull fracture.

Sinuses/Orbits: No acute finding.

Other: None

CT CERVICAL SPINE FINDINGS

Limited by body habitus, motion and shoulder artifacts.

Alignment: Mild straightening of cervical lordosis.  No listhesis.

Skull base and vertebrae: No acute fracture identified.

Soft tissues and spinal canal: Mild prominence of prevertebral soft
tissues likely related to patient body habitus. No visible canal
hematoma.

Disc levels: Multilevel degenerative disc disease with uncovertebral
joint osteoarthritis C3 through T1. Multilevel degenerative facet
arthropathy.

Upper chest: No dominant mass

Other: None
IMPRESSION: Limited study due to patient body habitus and motion artifacts.

No acute intracranial nor cervical spine abnormality. Cervical
spondylosis.

## 2020-01-27 IMAGING — CR DG CHEST 2V
2 series · 2 of 2 positions shown · non-contrast
Comparison: 07/06/2017

CLINICAL DATA: Short of breath after a fall.

EXAM:
CHEST - 2 VIEW

[w chest lat]
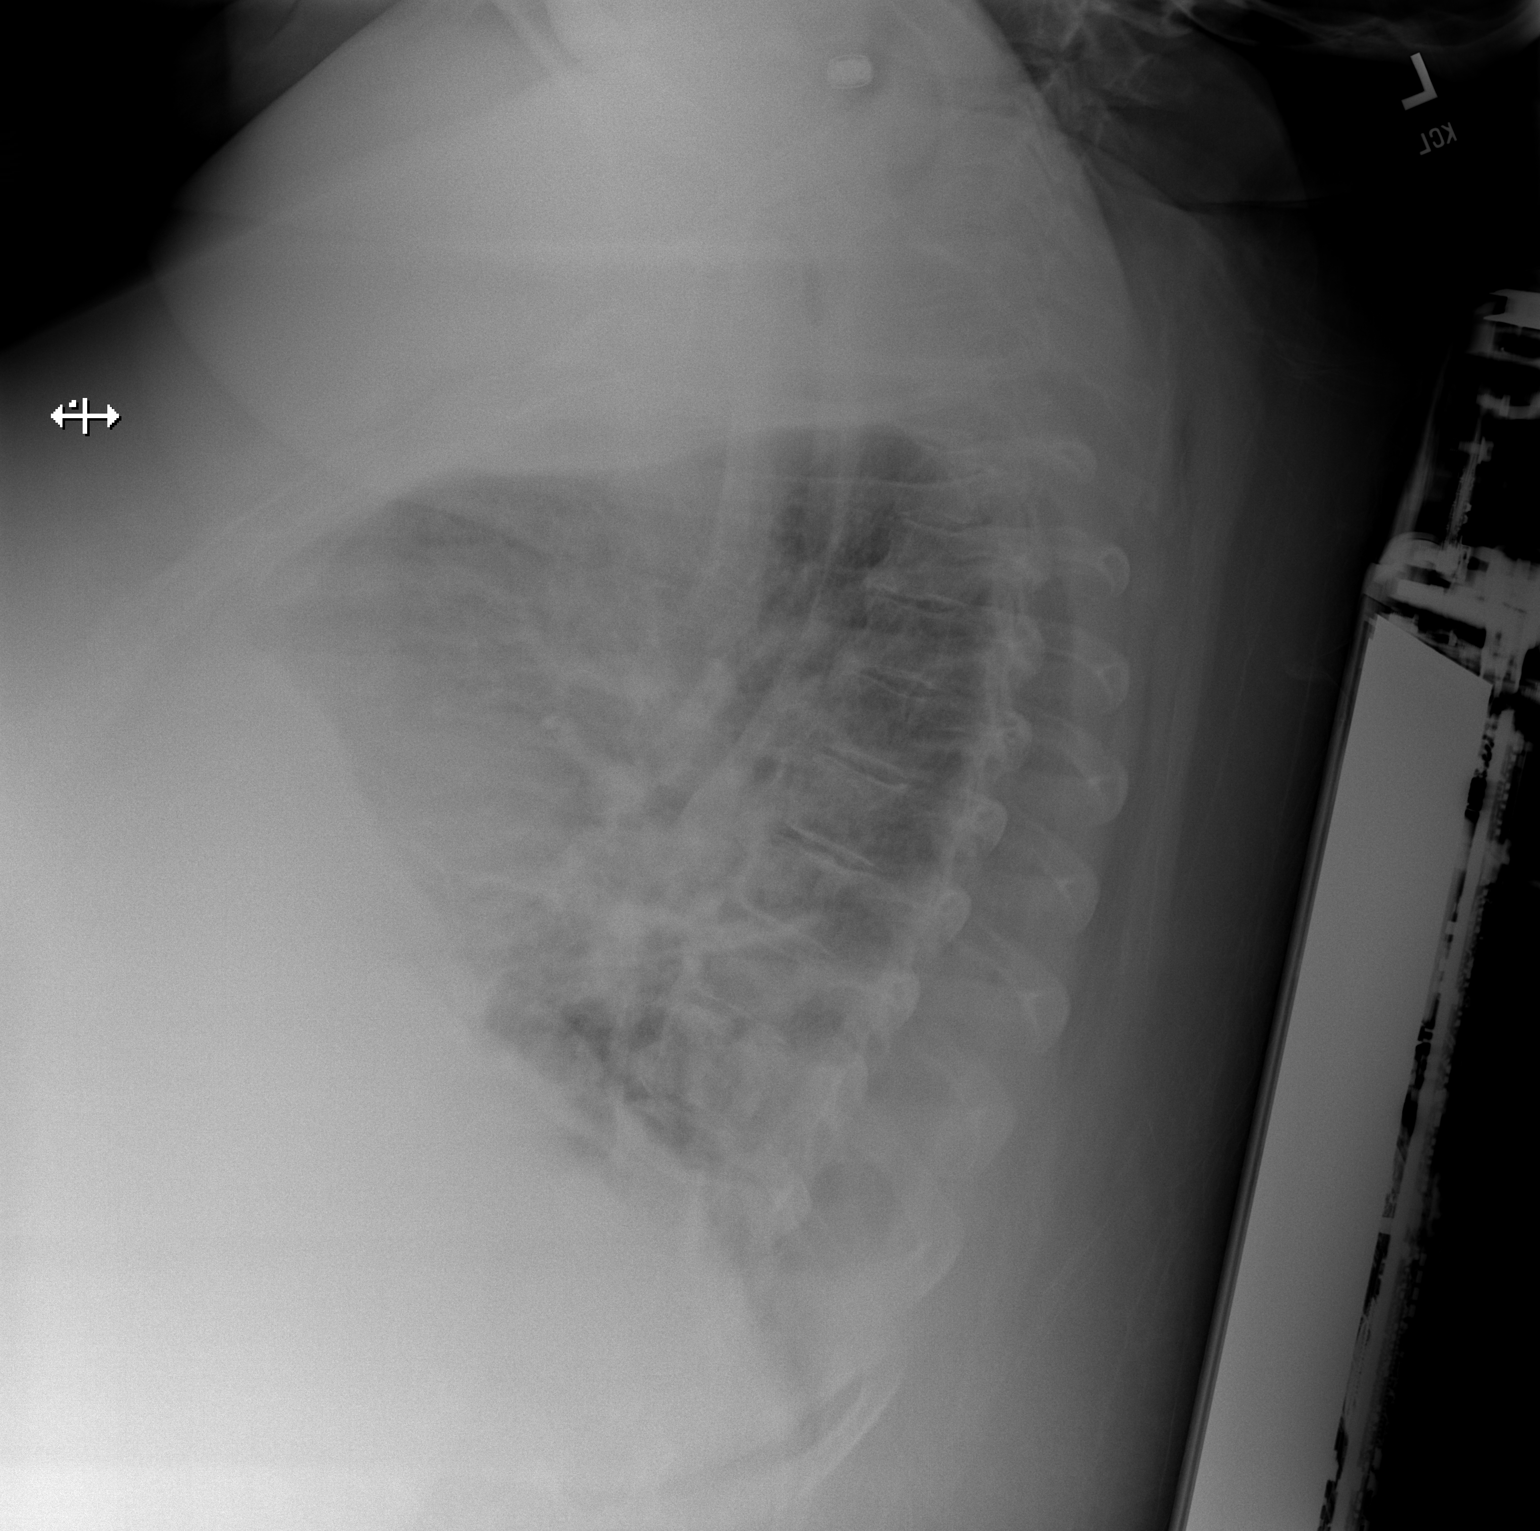

[x chest ap]
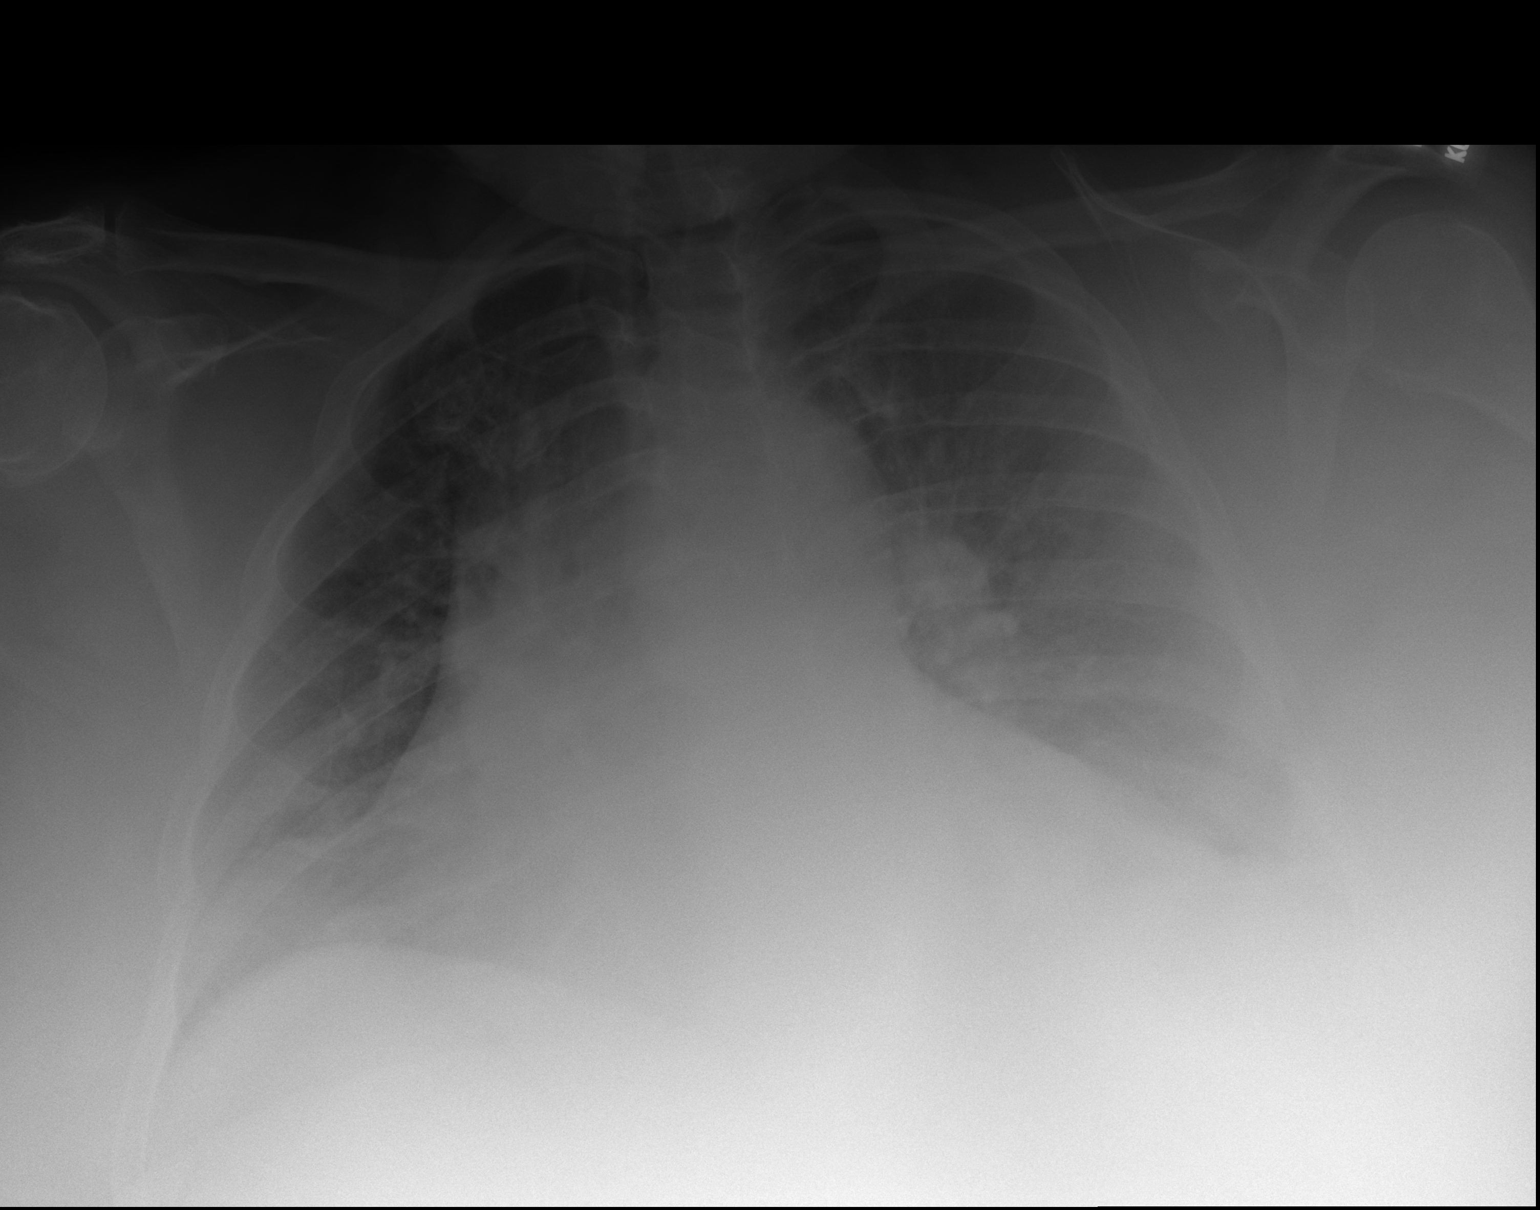

[2 of 2 positions shown; findings below may reference images not displayed]

FINDINGS: Both views are degraded by patient body habitus. Frontal view is
oblique, apical lordotic positioning. Cardiomegaly accentuated by AP
portable technique. No definite pleural fluid. No pneumothorax. Low
lung volumes with resultant pulmonary interstitial prominence. No
well-defined lobar consolidation. Lung bases not well evaluated,
especially on the frontal radiograph.
IMPRESSION: Moderately degraded exam, as detailed above.

Cardiomegaly and low lung volumes.  No definite acute disease.

## 2020-05-03 IMAGING — DX DG CHEST 2V
2 series · 2 of 2 positions shown · non-contrast
Comparison: 07/10/2017

CLINICAL DATA: Shortness of breath. Chronic but worse tonight. Pain
and swelling in the legs.

EXAM:
CHEST - 2 VIEW

[chest lat]
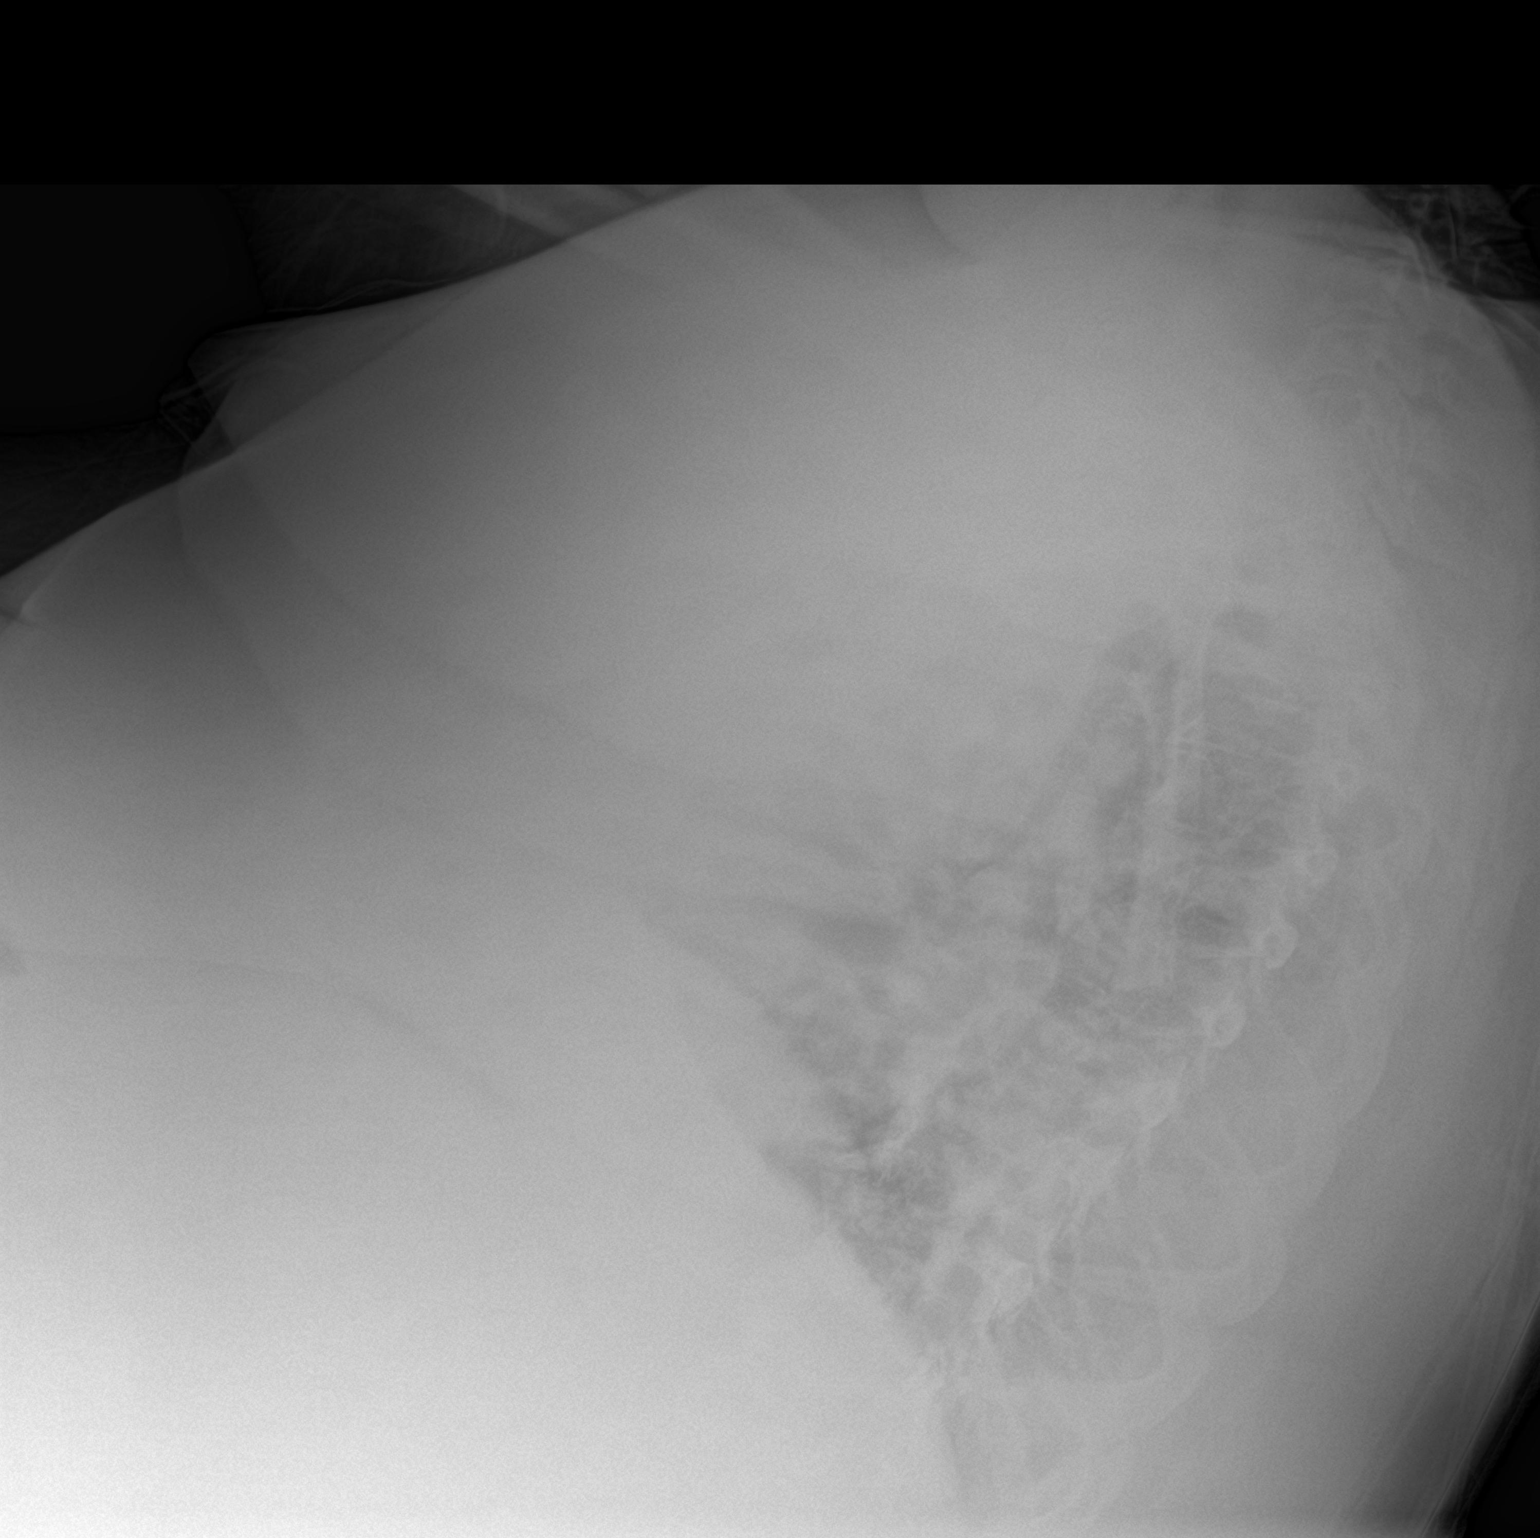

[chest ap]
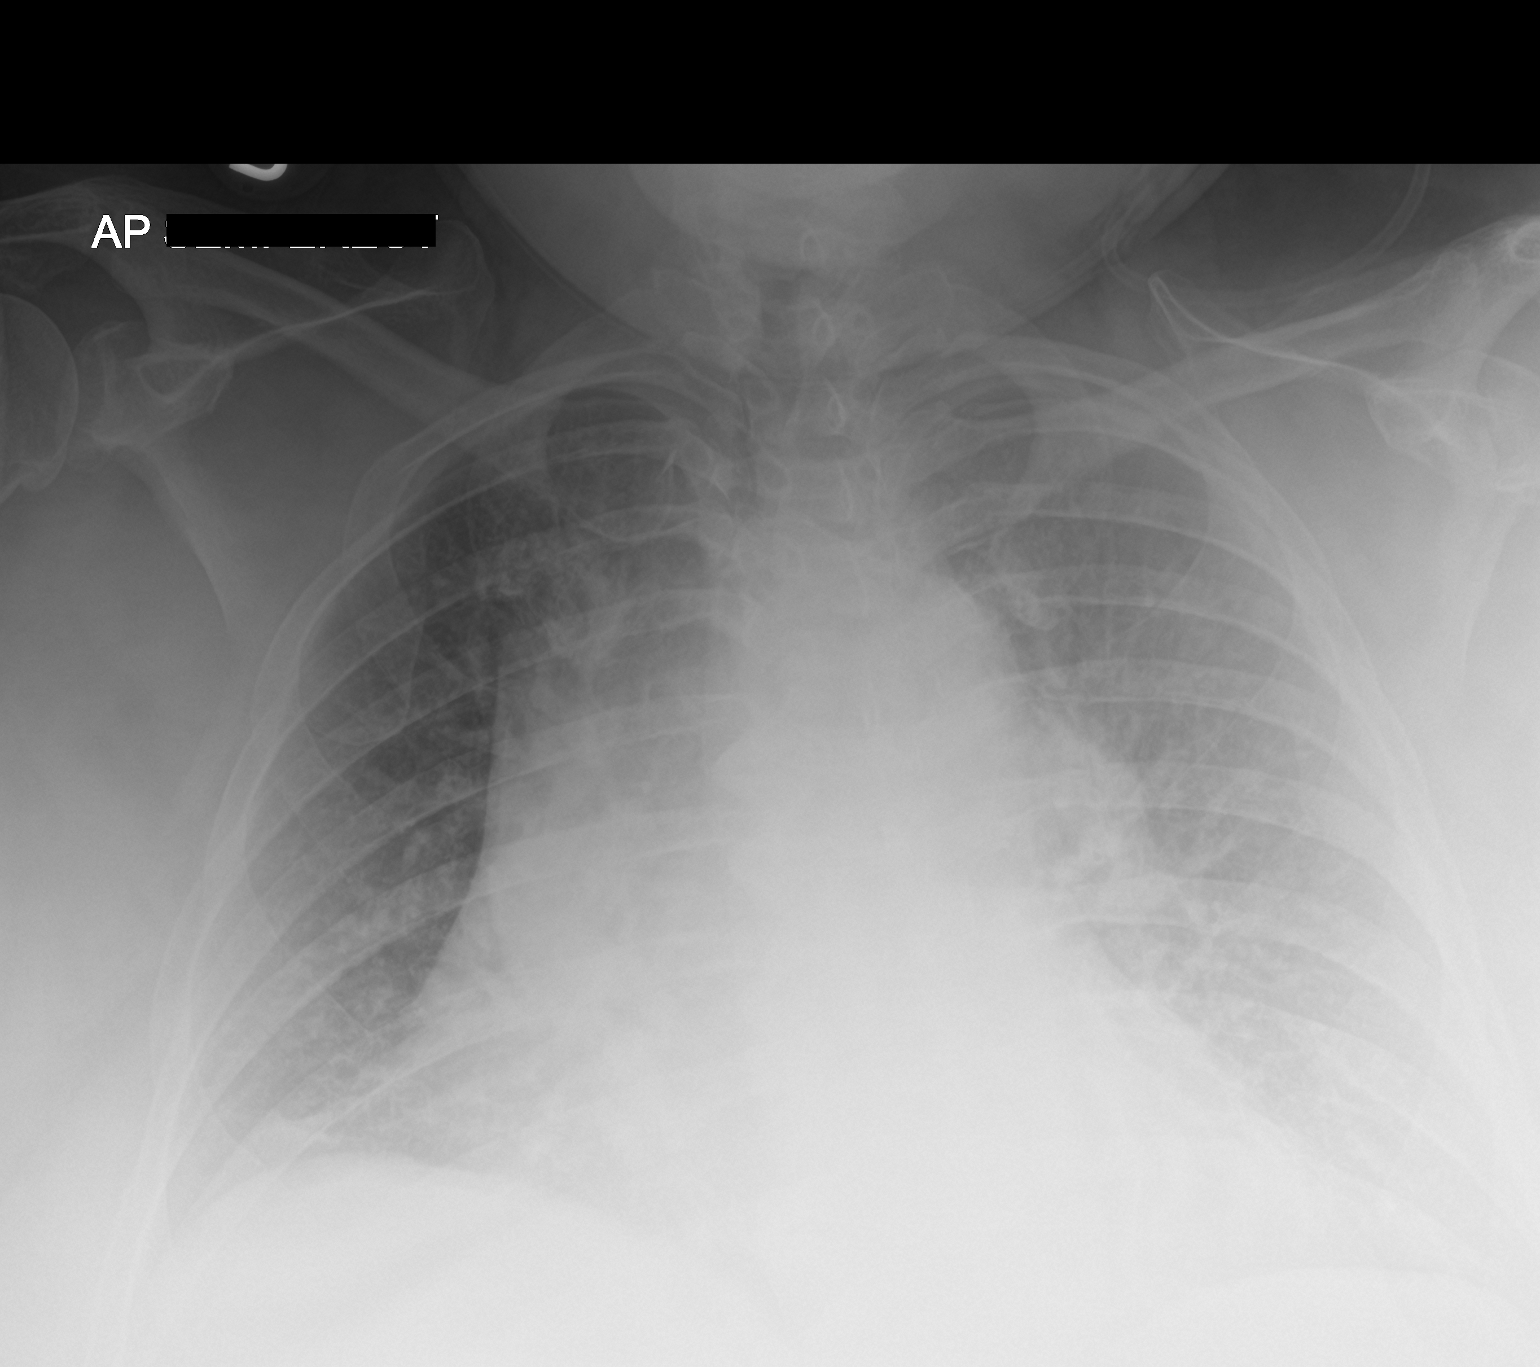

[2 of 2 positions shown; findings below may reference images not displayed]

FINDINGS: Shallow inspiration. Diffuse cardiac enlargement. Pulmonary vascular
congestion. Hazy perihilar infiltrates likely indicate edema. Edema
peers to be progressed since previous study. No blunting of
costophrenic angles. No pneumothorax. Mediastinal contours appear
intact.
IMPRESSION: Cardiac enlargement with pulmonary vascular congestion and perihilar
edema, demonstrating progression since previous study.

## 2020-05-03 IMAGING — DX DG TIBIA/FIBULA 2V*R*
3 series · 3 of 3 positions shown · non-contrast
Comparison: 07/08/2017

CLINICAL DATA: Leg swelling with pain

EXAM:
RIGHT TIBIA AND FIBULA - 2 VIEW

[tibia ap (1 of 2)]
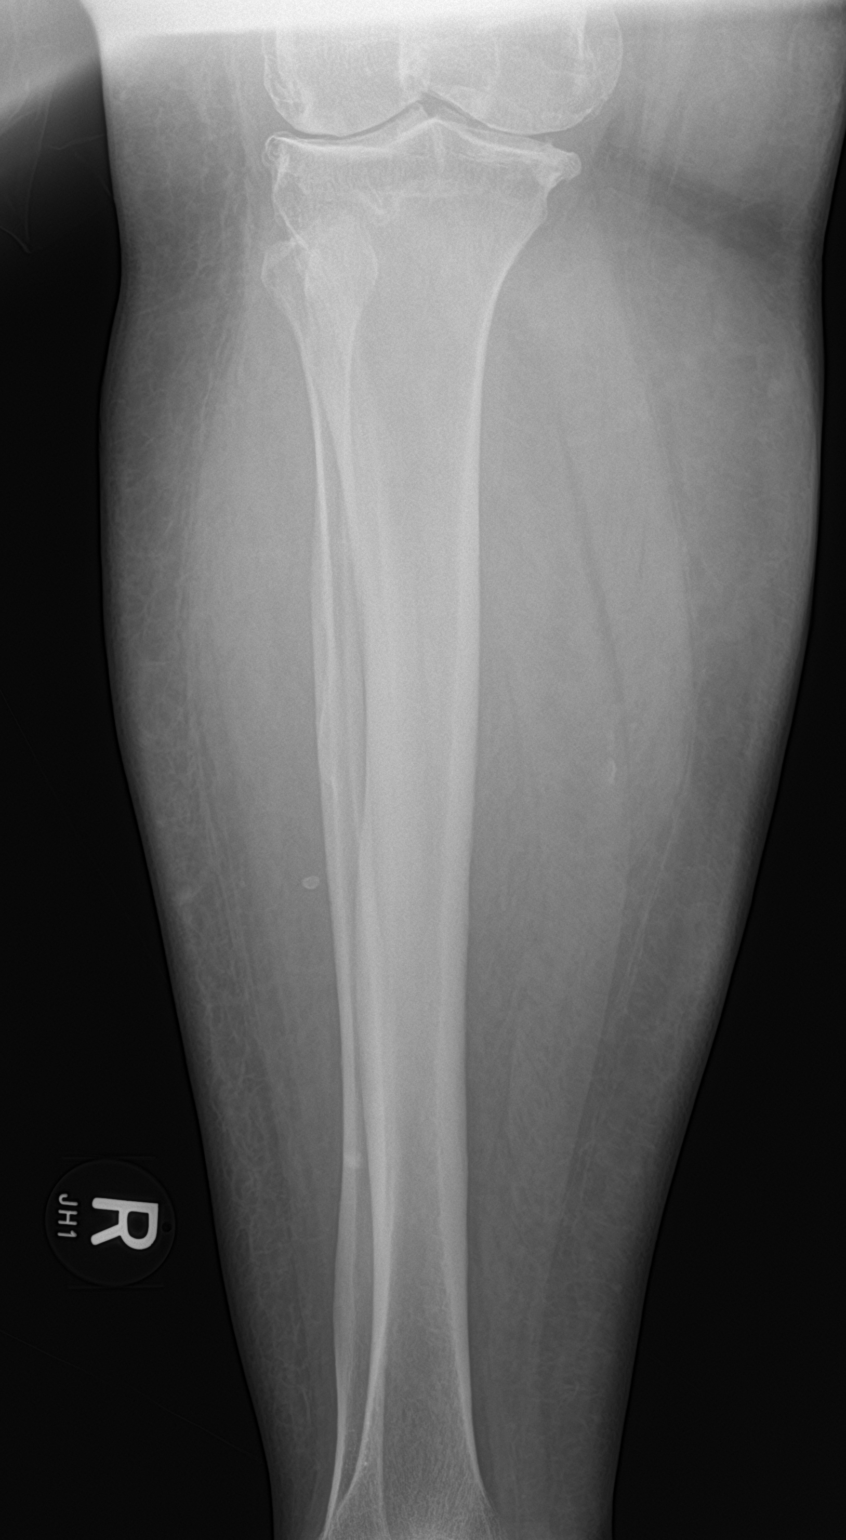

[tibia ap (2 of 2)]
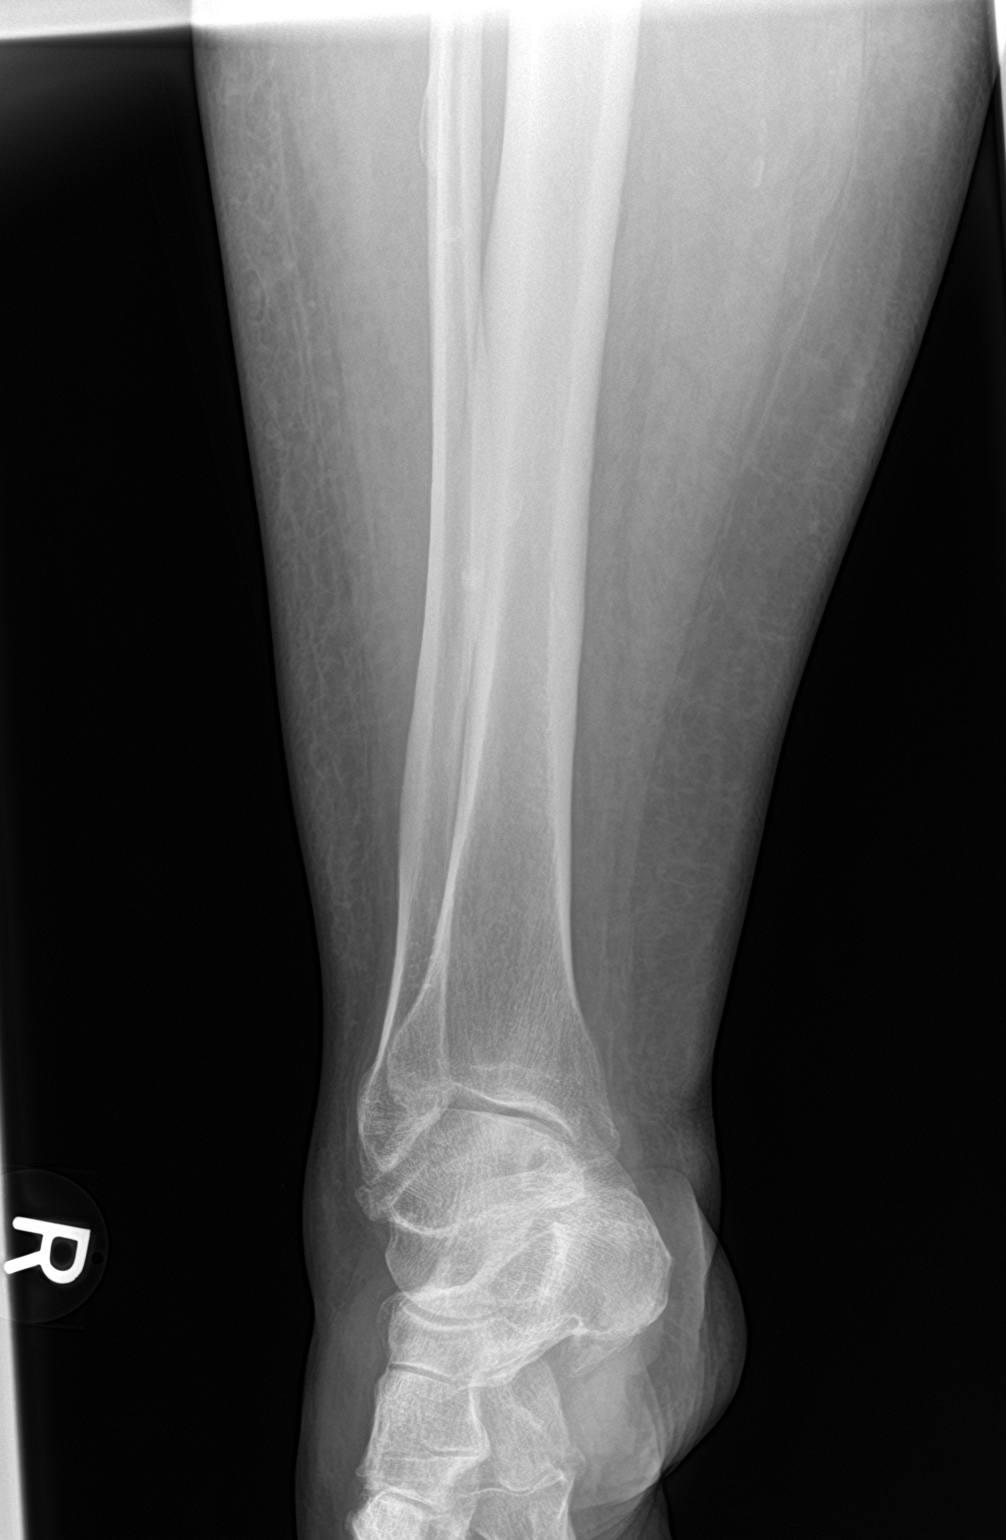

[tibia lat]
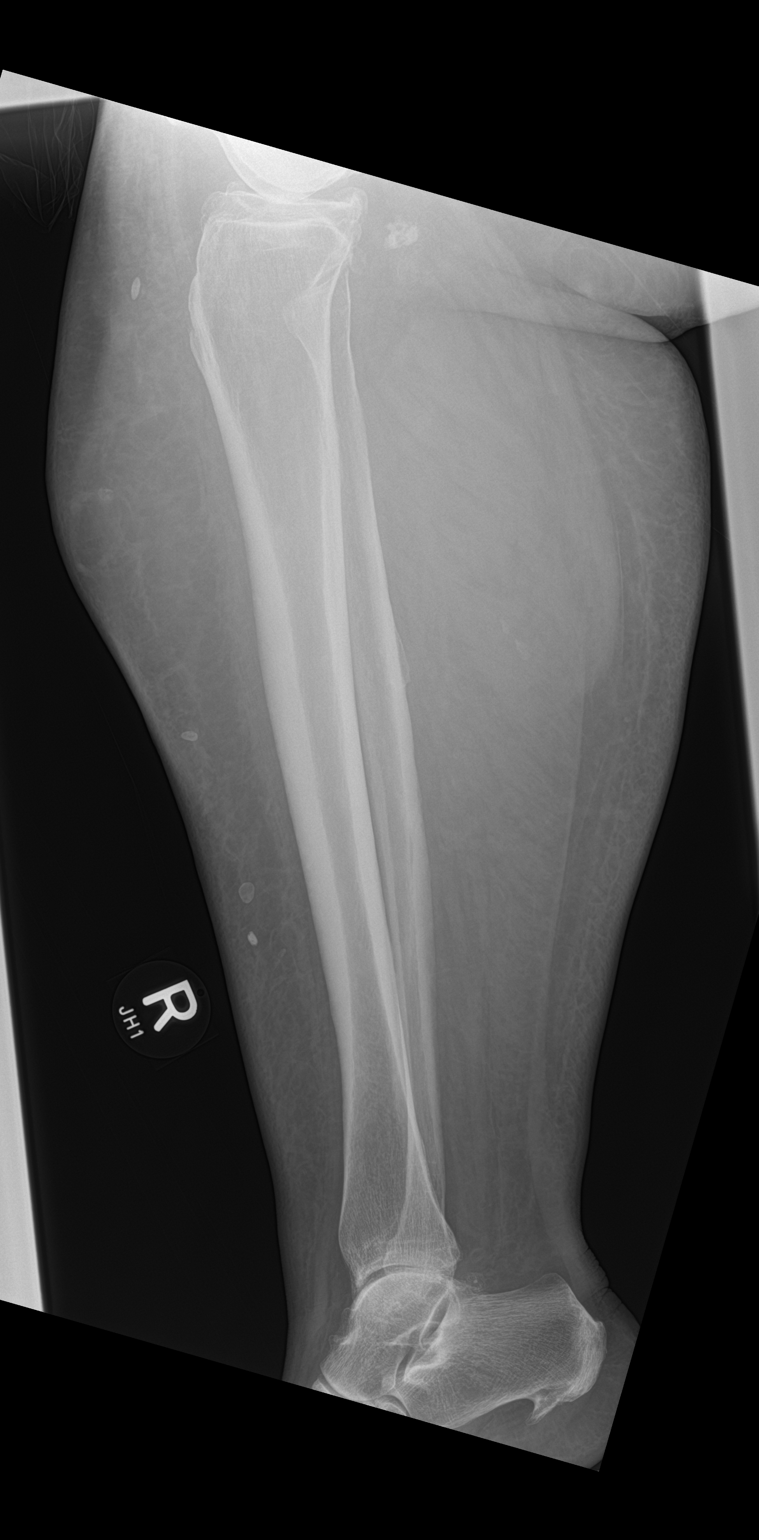

[3 of 3 positions shown; findings below may reference images not displayed]

FINDINGS: No fracture or malalignment. No periostitis or bone destruction.
Degenerative changes at the knee. Diffuse soft tissue edema without
soft tissue emphysema.
IMPRESSION: No acute osseous abnormality.

## 2020-10-23 IMAGING — DX DG KNEE COMPLETE 4+V*L*
4 series · 4 of 4 positions shown · non-contrast
Comparison: 08/05/2004

CLINICAL DATA: BILATERAL knee pain, hurts to walk

EXAM:
LEFT KNEE - COMPLETE 4+ VIEW

[knee ap]
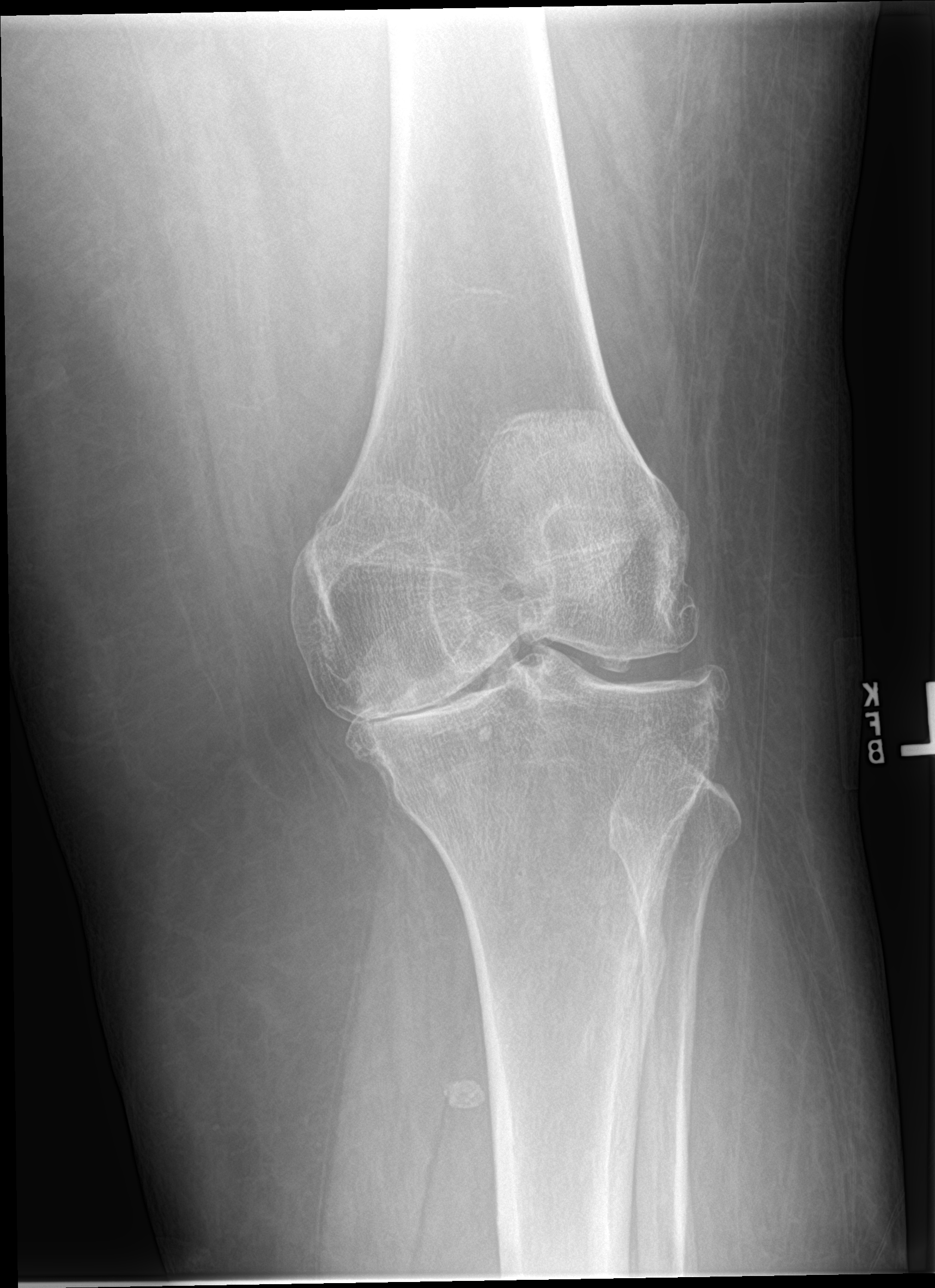

[knee lat]
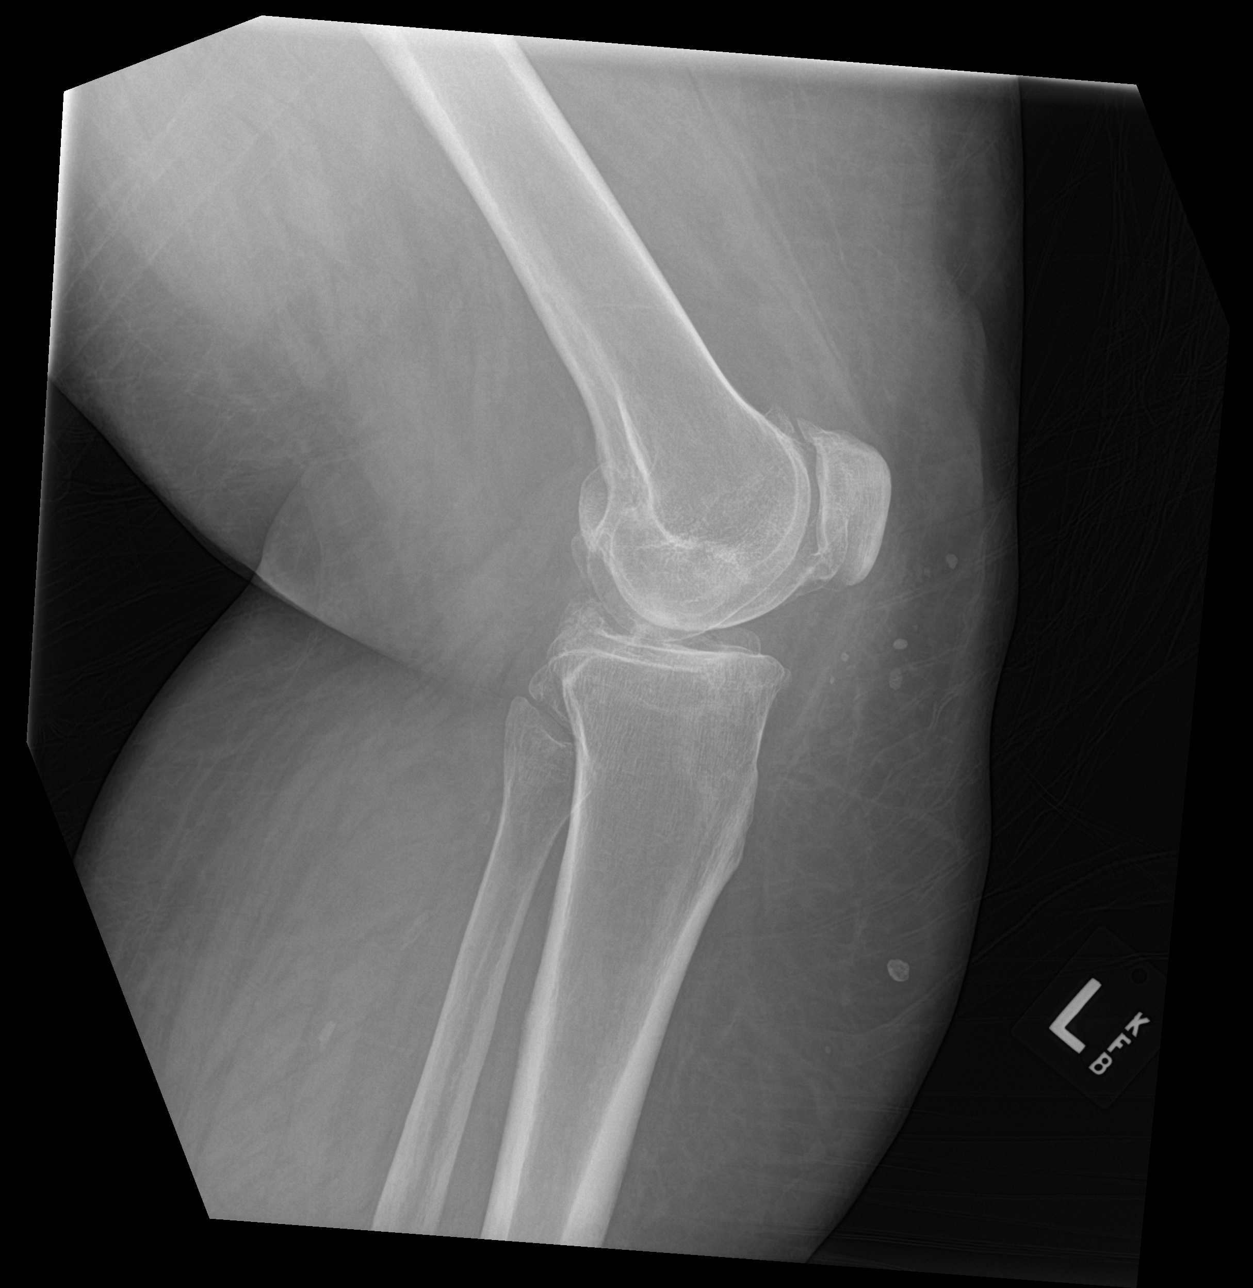

[knee obl (1 of 2)]
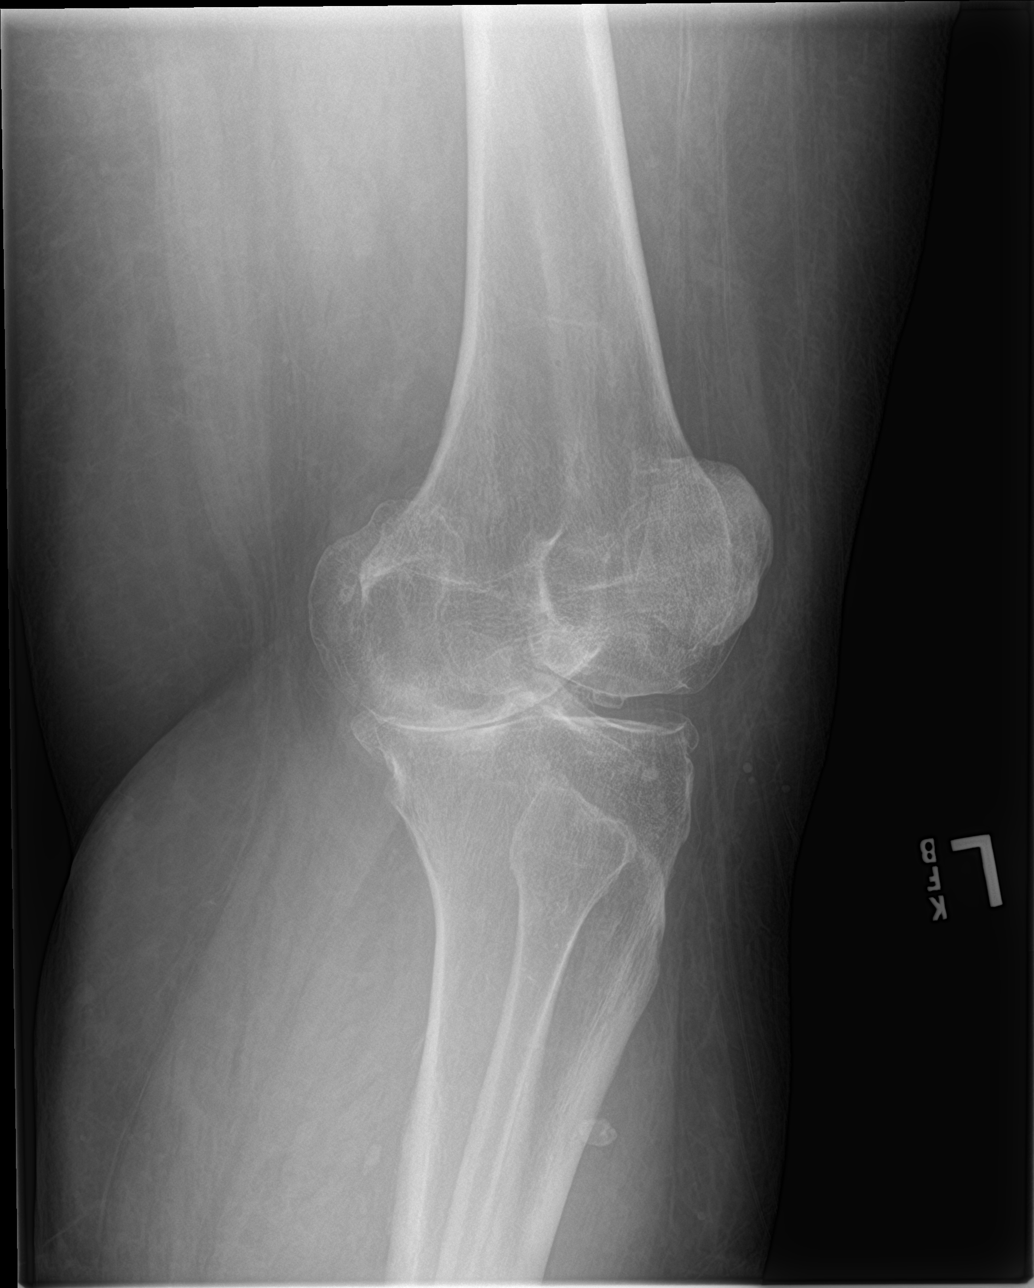

[knee obl (2 of 2)]
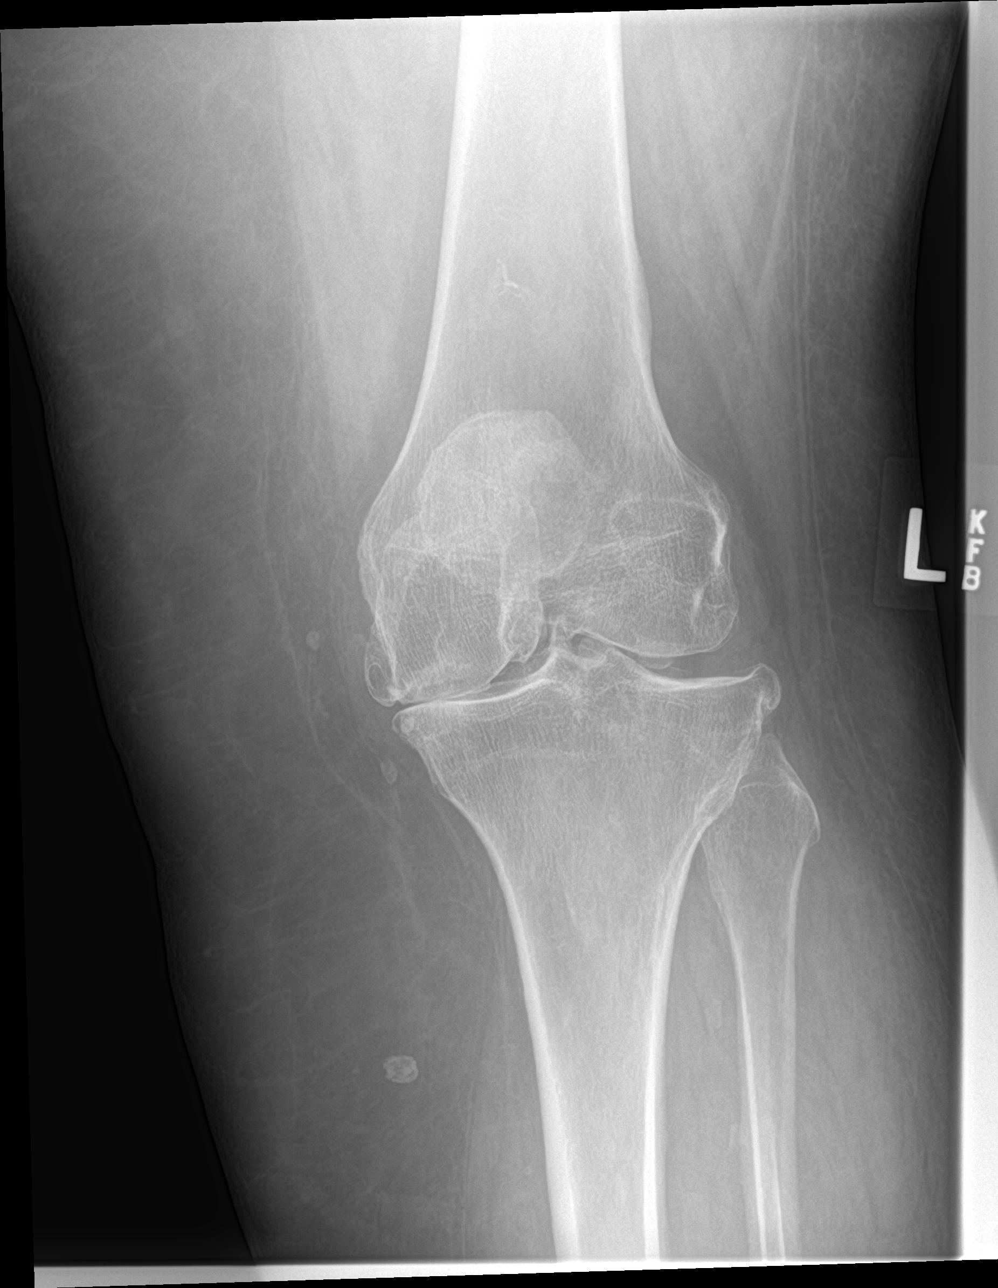

[4 of 4 positions shown; findings below may reference images not displayed]

FINDINGS: Osseous demineralization.

Tricompartmental osteoarthritic changes with joint space narrowing
and spur formation greatest at the medial compartment where
bone-on-bone appearance is seen.

No acute fracture, dislocation, or bone destruction.

No knee joint effusion.

Scattered soft tissue swelling and soft tissue phleboliths.
IMPRESSION: Advanced tricompartmental osteoarthritic changes of the LEFT knee.

## 2020-10-23 IMAGING — DX DG CHEST 2V
2 series · 2 of 2 positions shown · non-contrast
Comparison: 10/15/2017

Correlation: CT chest 11/15/2014

CLINICAL DATA: Back pain and BILATERAL knee pain, cough, shortness
of breath, history asthma, CHF, chronic kidney disease,
hypertension, former smoker

EXAM:
CHEST - 2 VIEW

[chest lat]
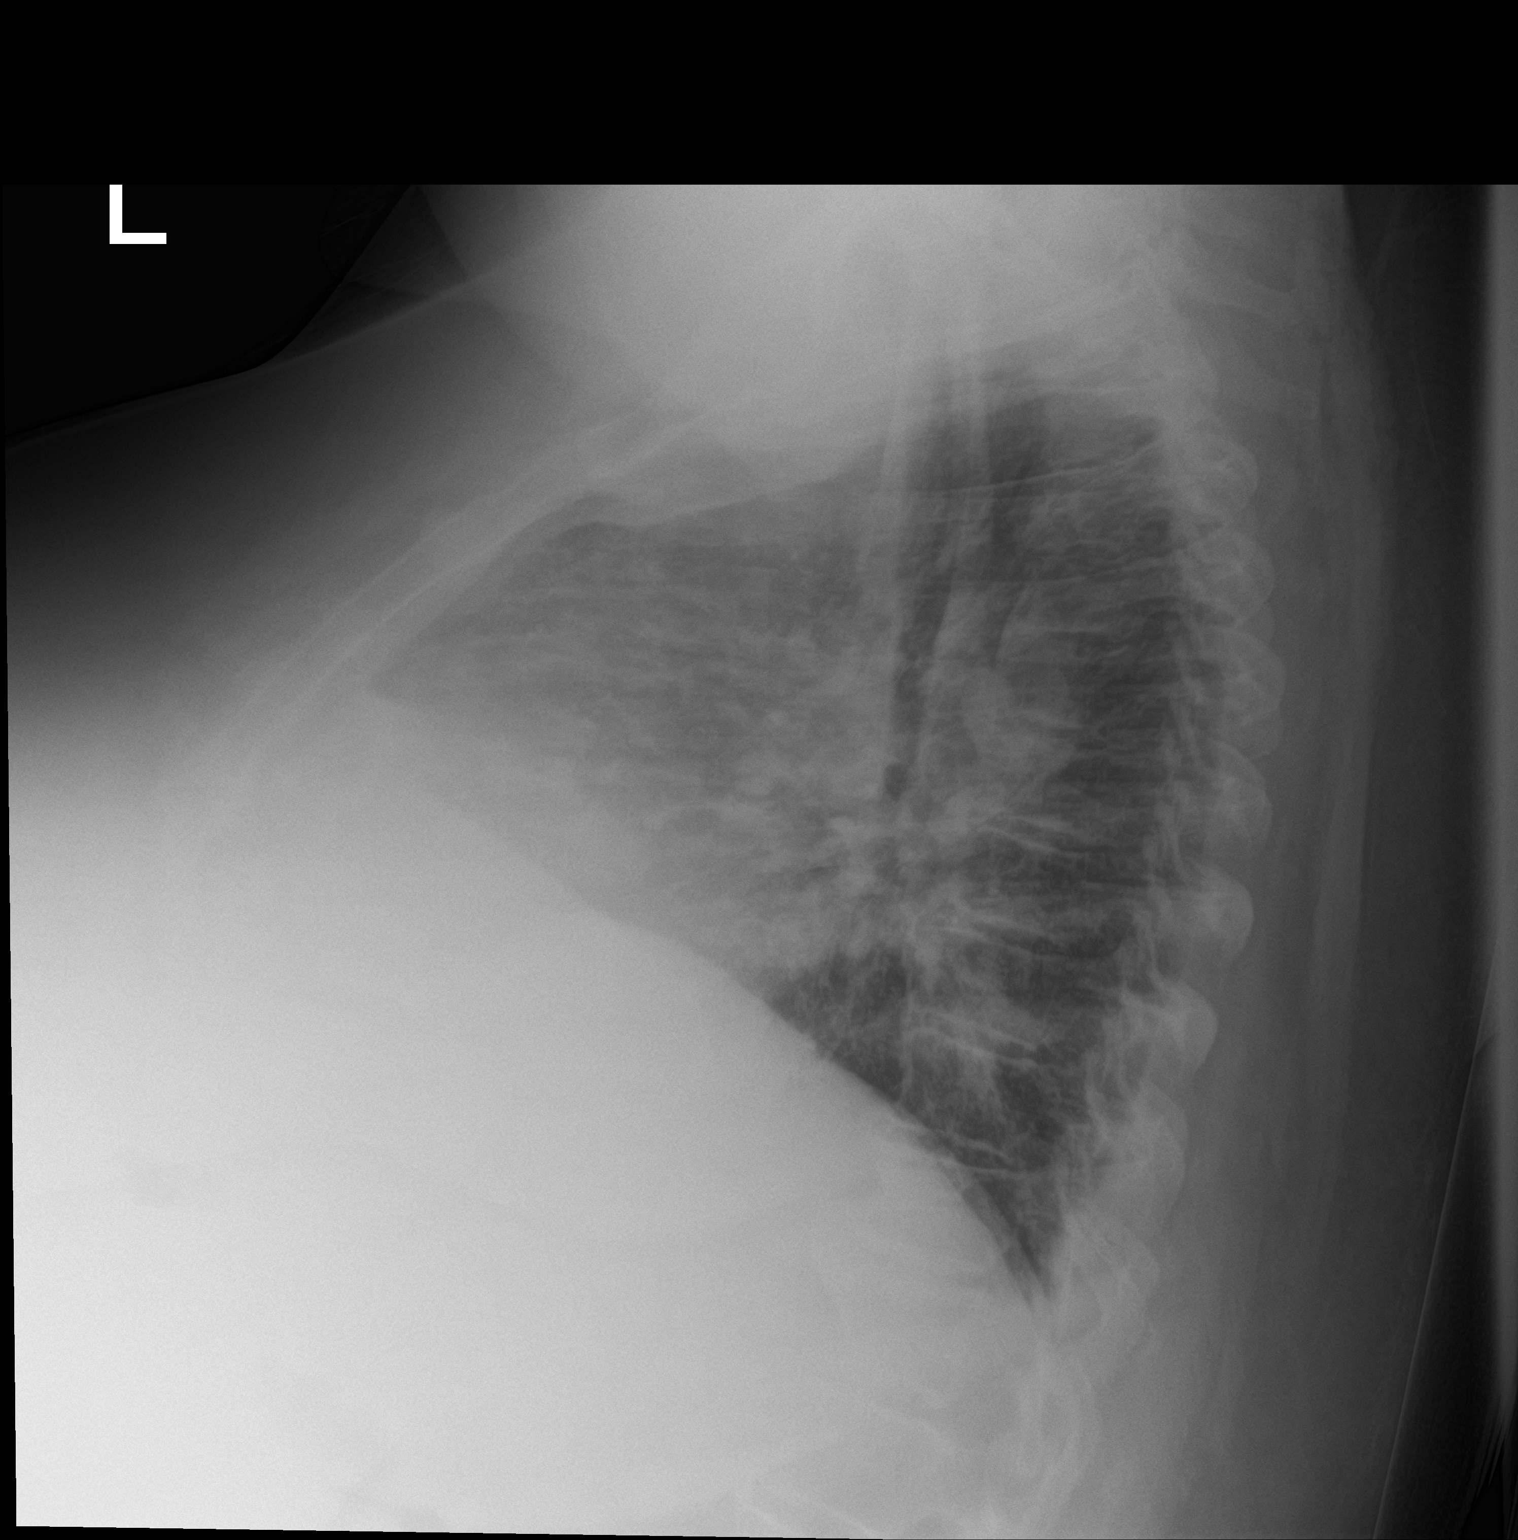

[chest ap]
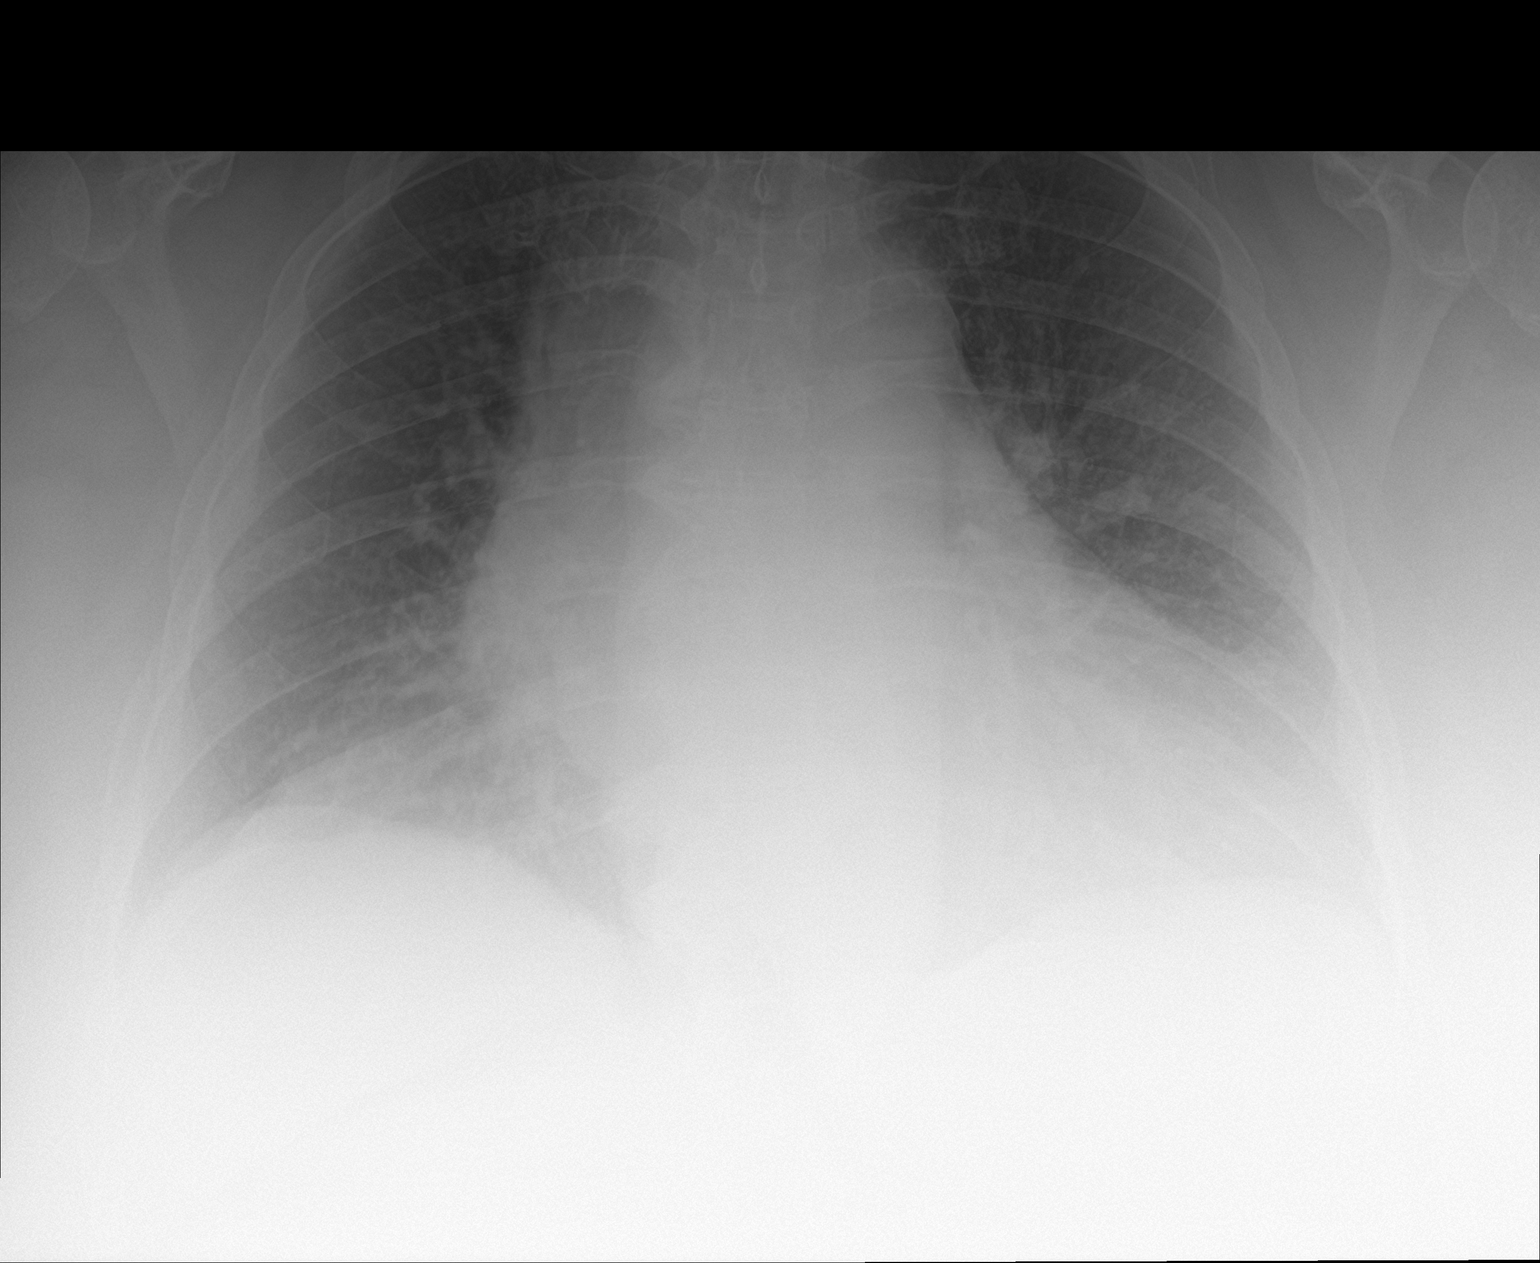

[2 of 2 positions shown; findings below may reference images not displayed]

FINDINGS: Enlargement of cardiac silhouette.

Density at the RIGHT cardiophrenic angle likely reflects a prominent
epicardial fat pad and is grossly unchanged since a prior CT exam.

Mediastinal contours and pulmonary vascularity otherwise normal.

Lungs clear.

No infiltrate, pleural effusion or pneumothorax.
IMPRESSION: Enlargement of cardiac silhouette.

No acute abnormalities.

## 2020-10-23 IMAGING — DX DG KNEE COMPLETE 4+V*R*
4 series · 4 of 4 positions shown · non-contrast
Comparison: 07/08/2017

CLINICAL DATA: BILATERAL knee pain, hurts to walk

EXAM:
RIGHT KNEE - COMPLETE 4+ VIEW

[knee ap]
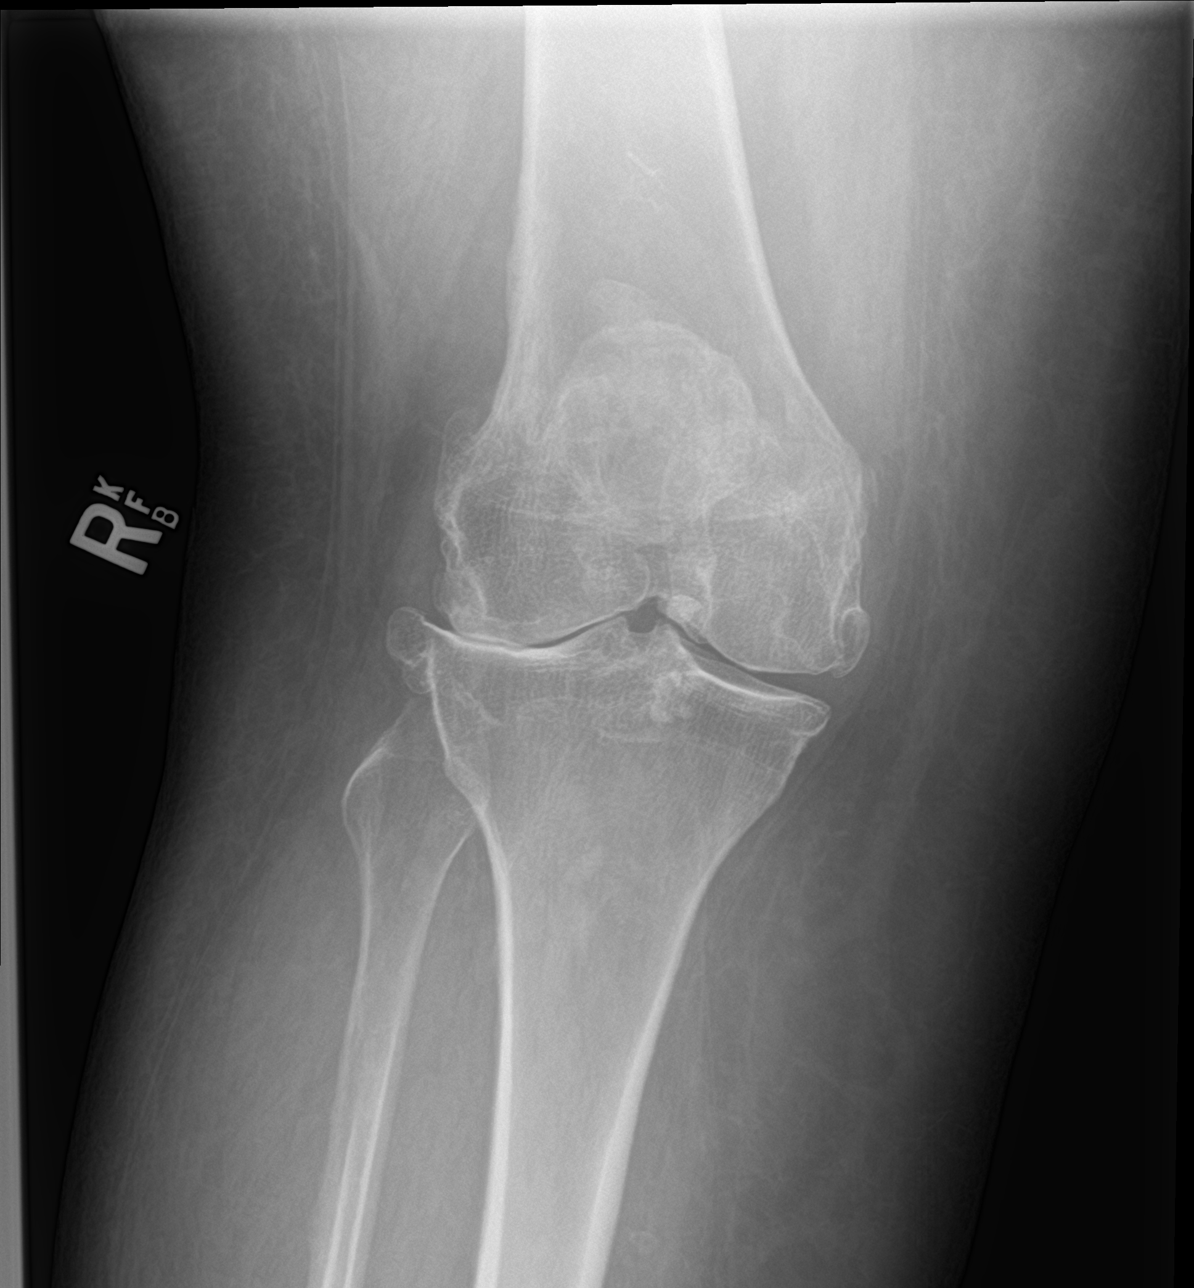

[knee lat]
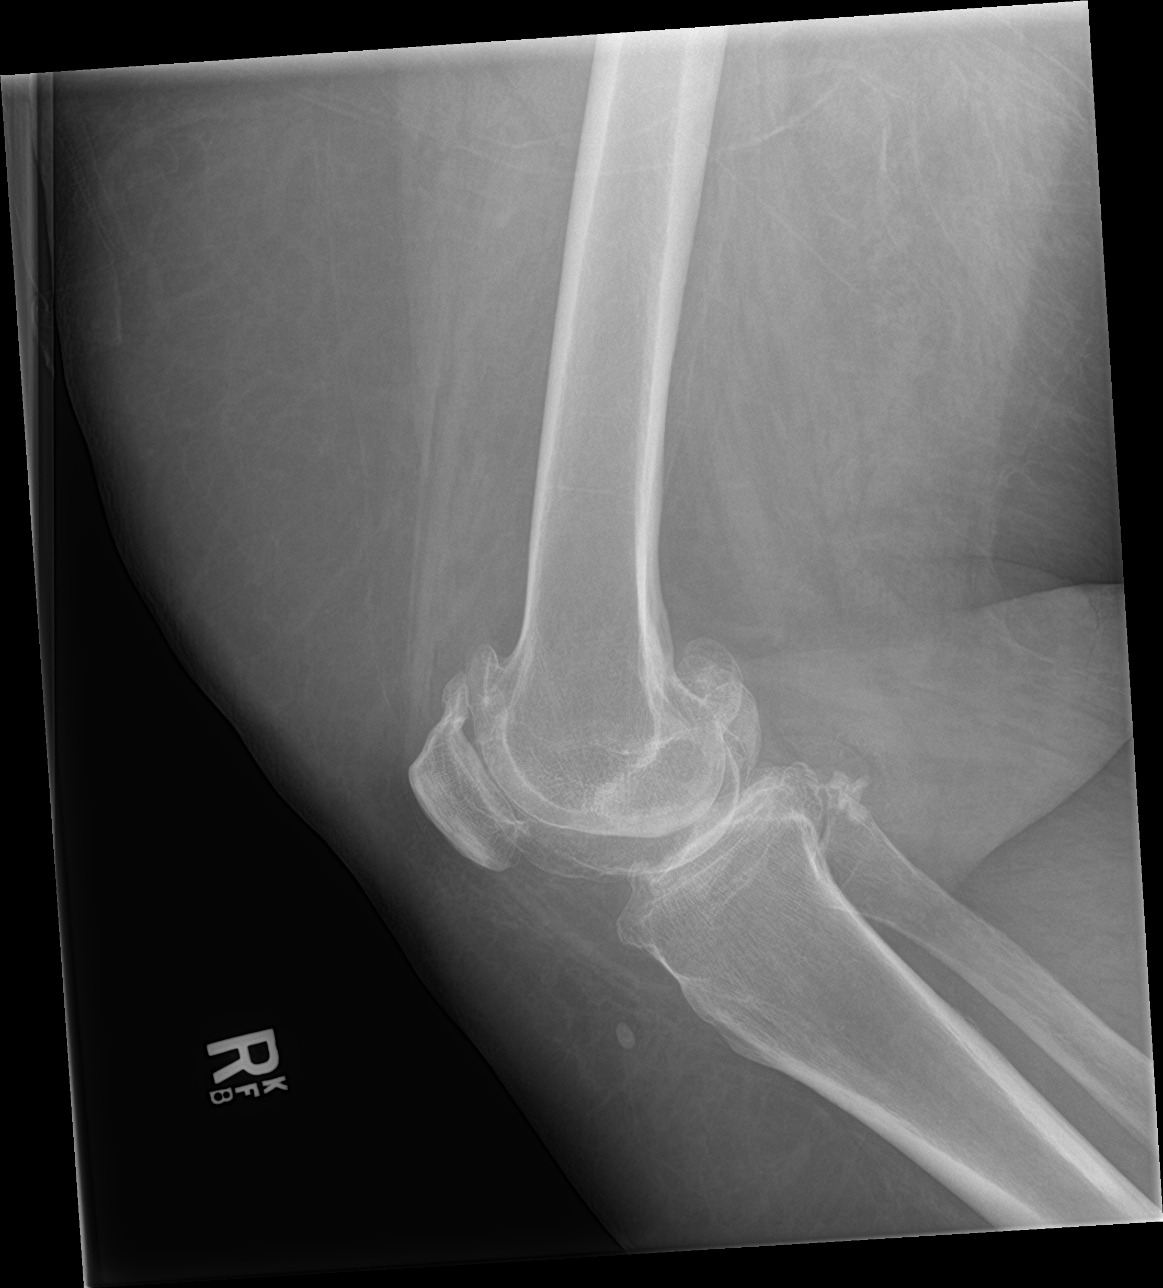

[knee obl (1 of 2)]
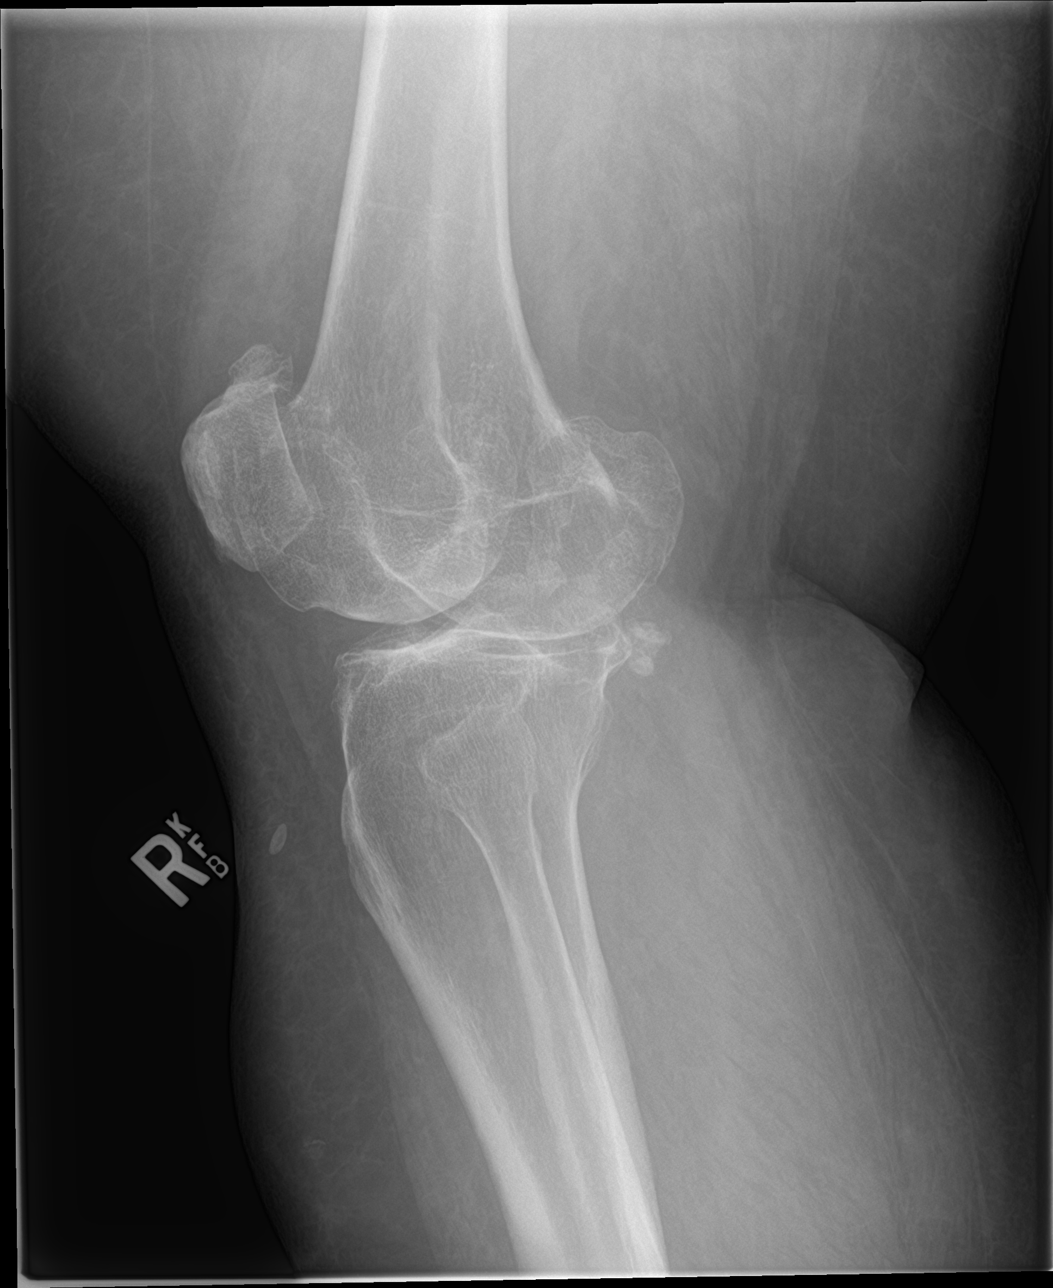

[knee obl (2 of 2)]
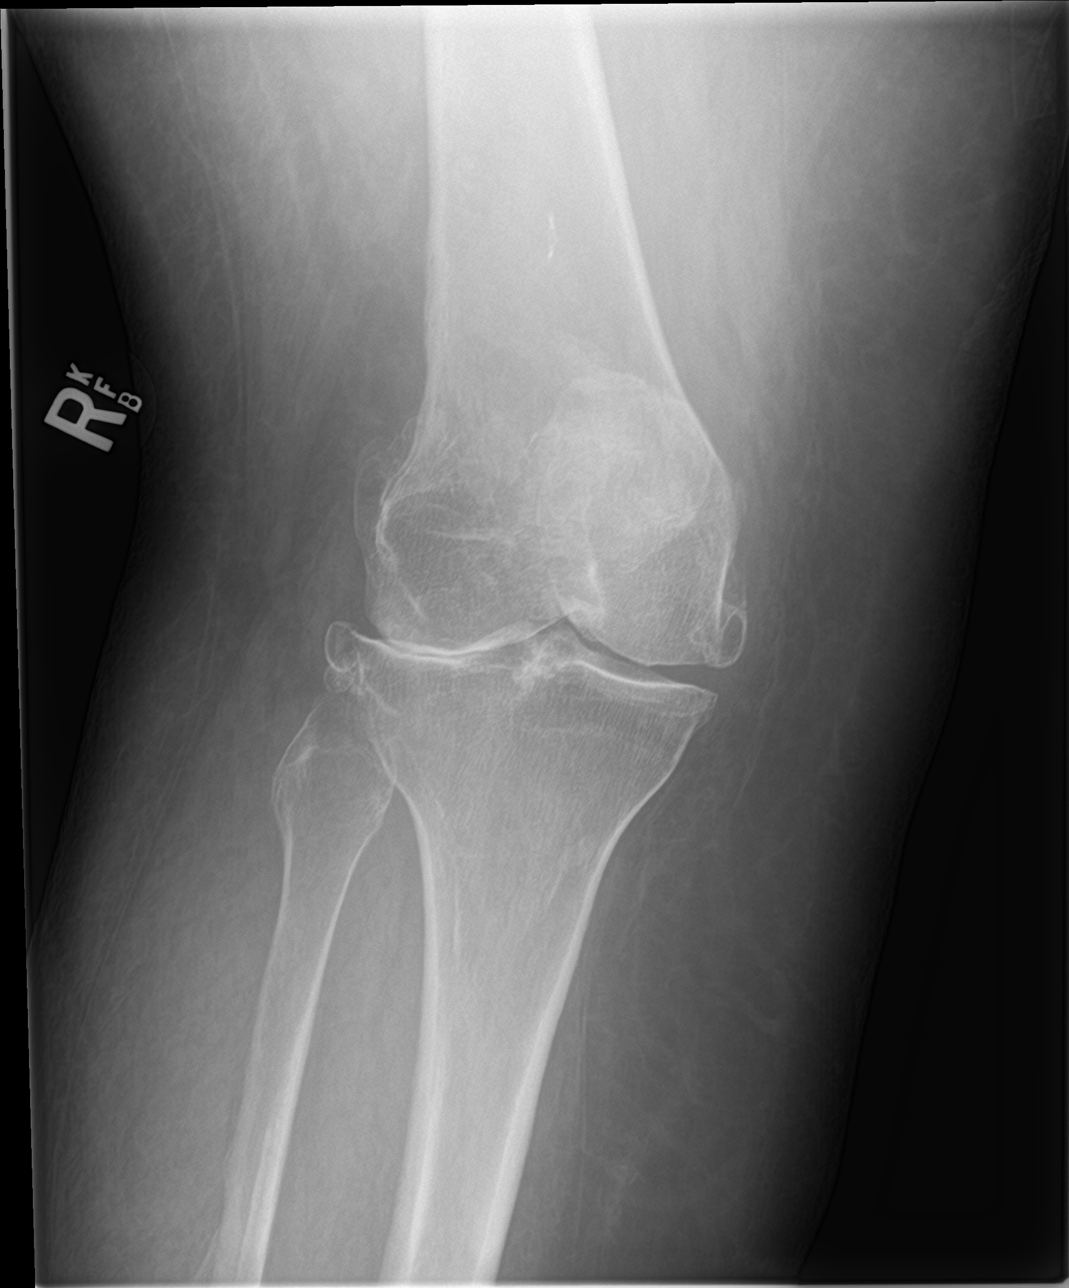

[4 of 4 positions shown; findings below may reference images not displayed]

FINDINGS: Osseous demineralization.

Tricompartmental osteoarthritic changes with joint space narrowing
and spur formation greatest at lateral compartment where
bone-on-bone appearance is seen.

No acute fracture, dislocation, or bone destruction.

No knee joint effusion.
IMPRESSION: Advanced osteoarthritic changes of the RIGHT knee.

## 2020-10-23 IMAGING — DX DG LUMBAR SPINE COMPLETE 4+V
4 series · 5 of 5 positions shown · non-contrast
Comparison: None

CLINICAL DATA: Back pain and BILATERAL knee pain

EXAM:
LUMBAR SPINE - COMPLETE 4+ VIEW

[l-spine ap]
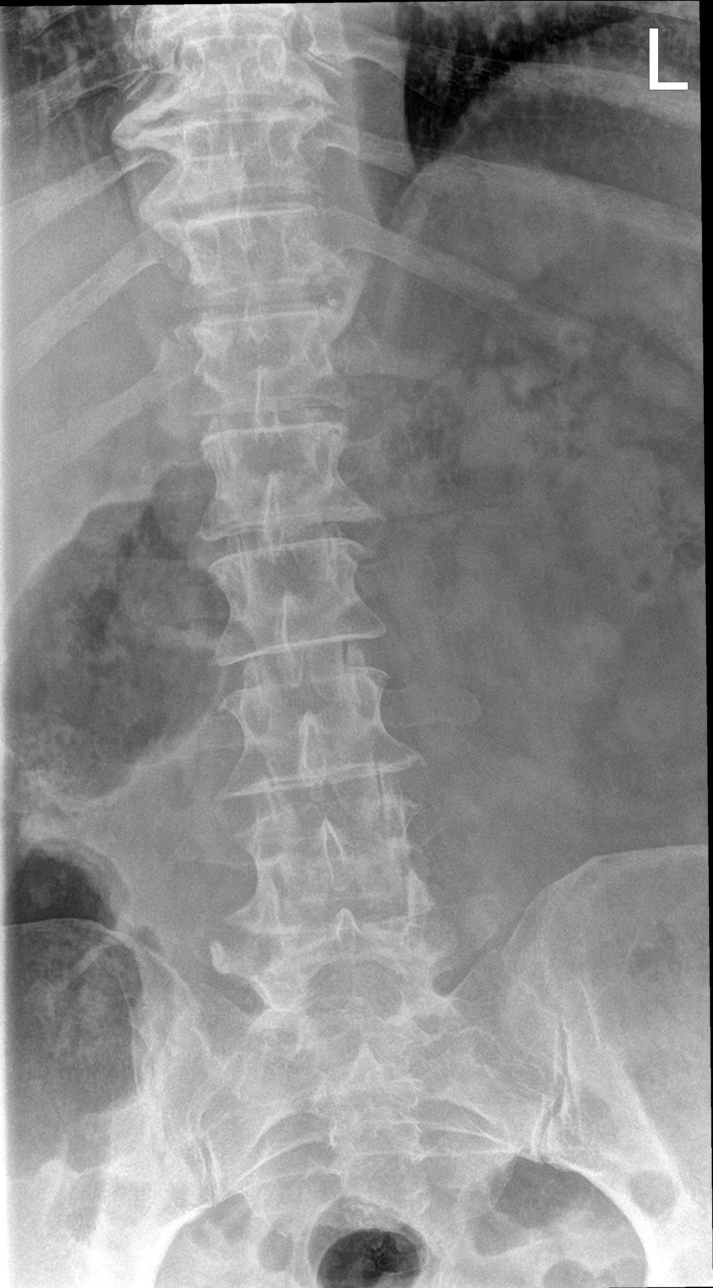

[Series 2: l-spine spot · 0.14mm/px · 2 of 2 slices shown]
[im 1/2]
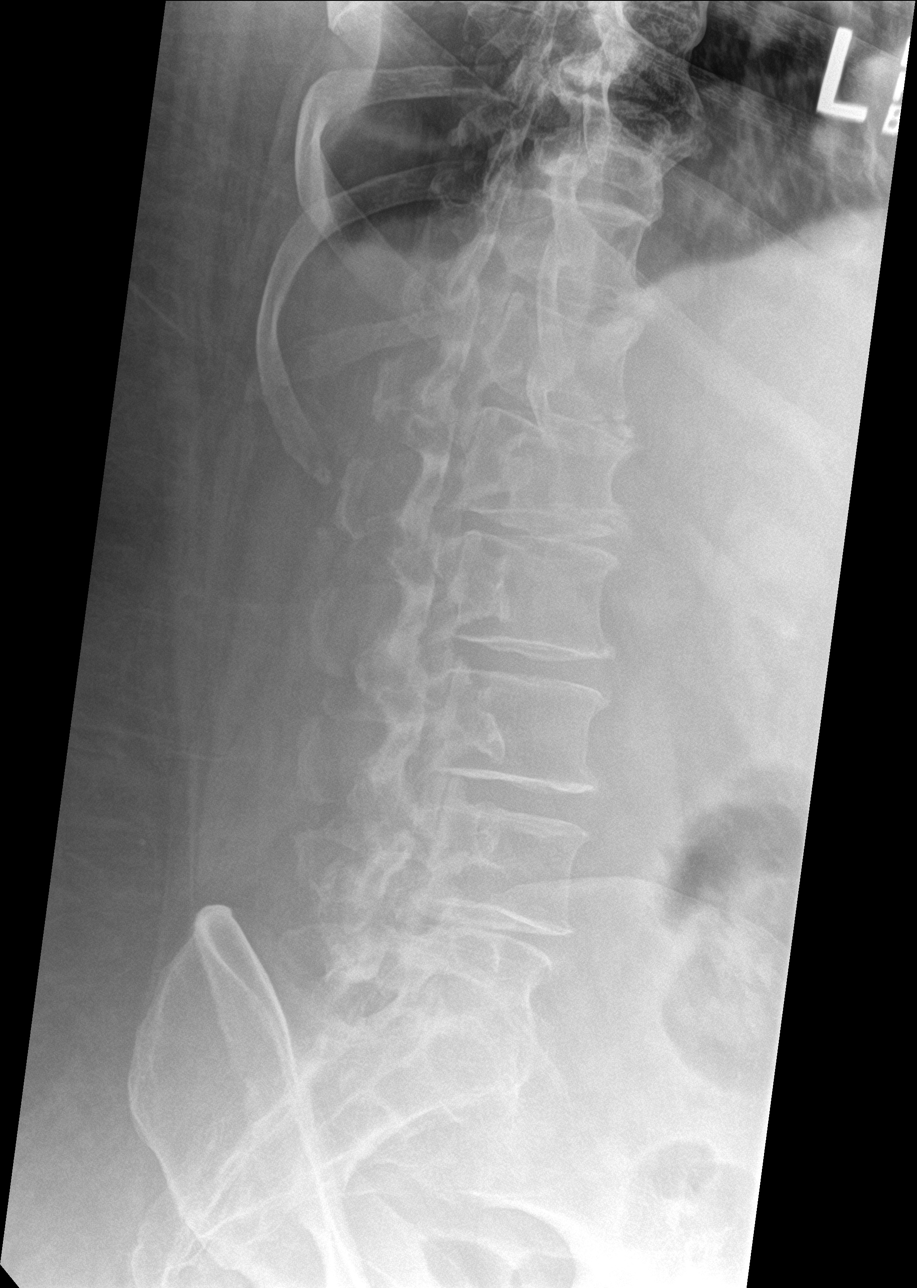
[im 2/2]
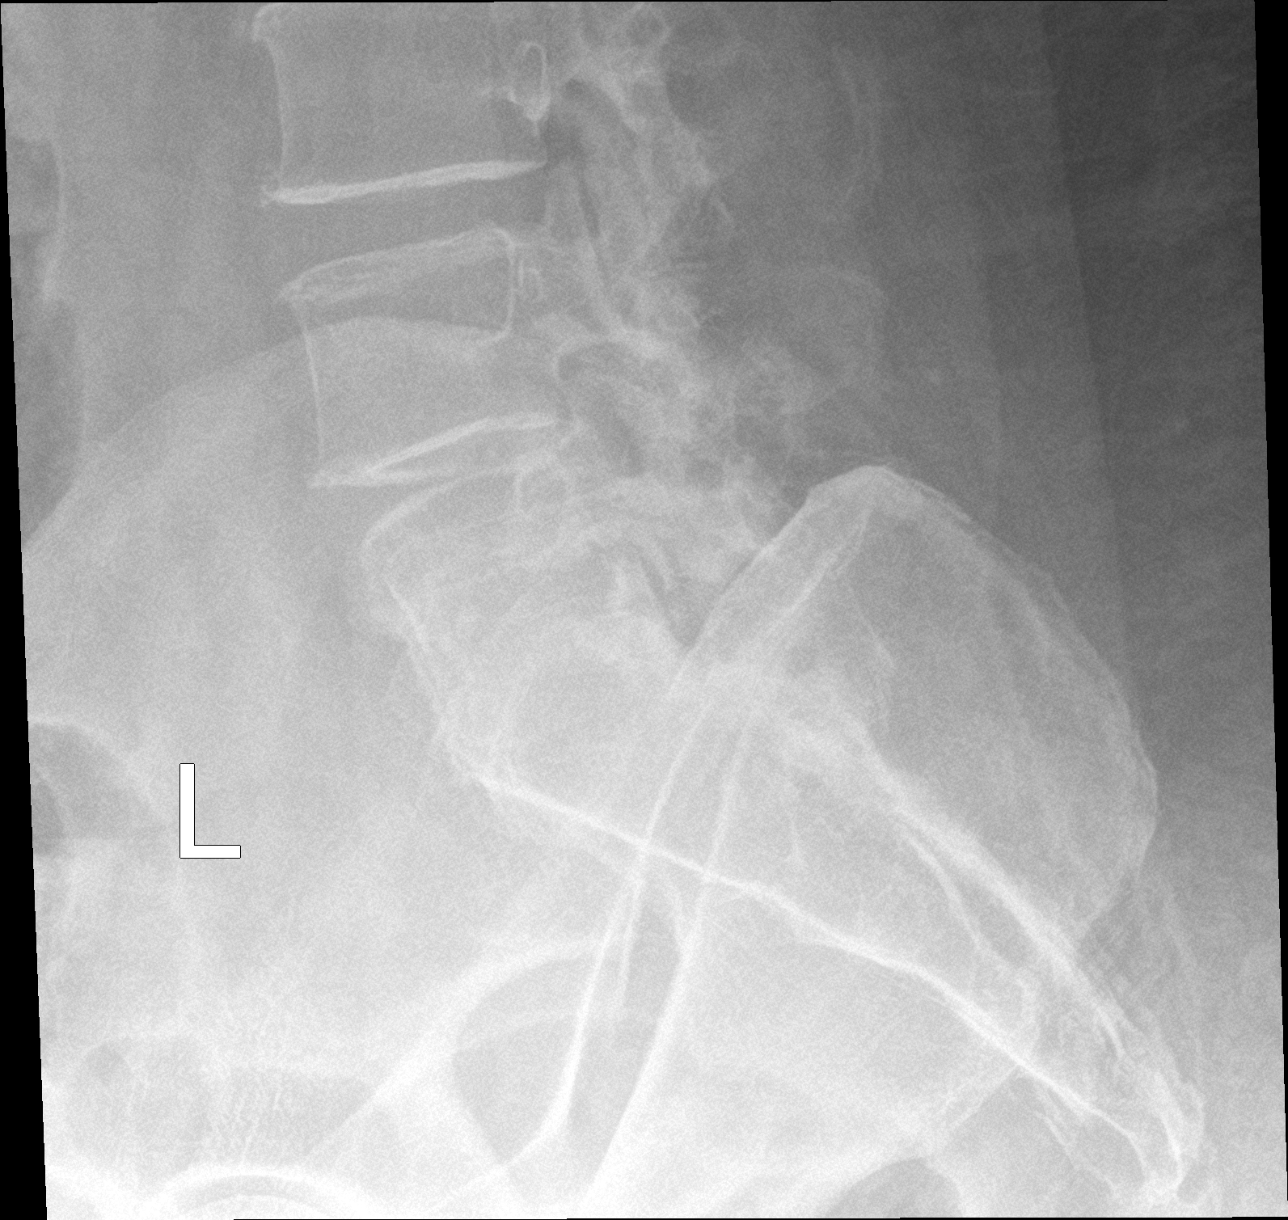

[l-spine obl]
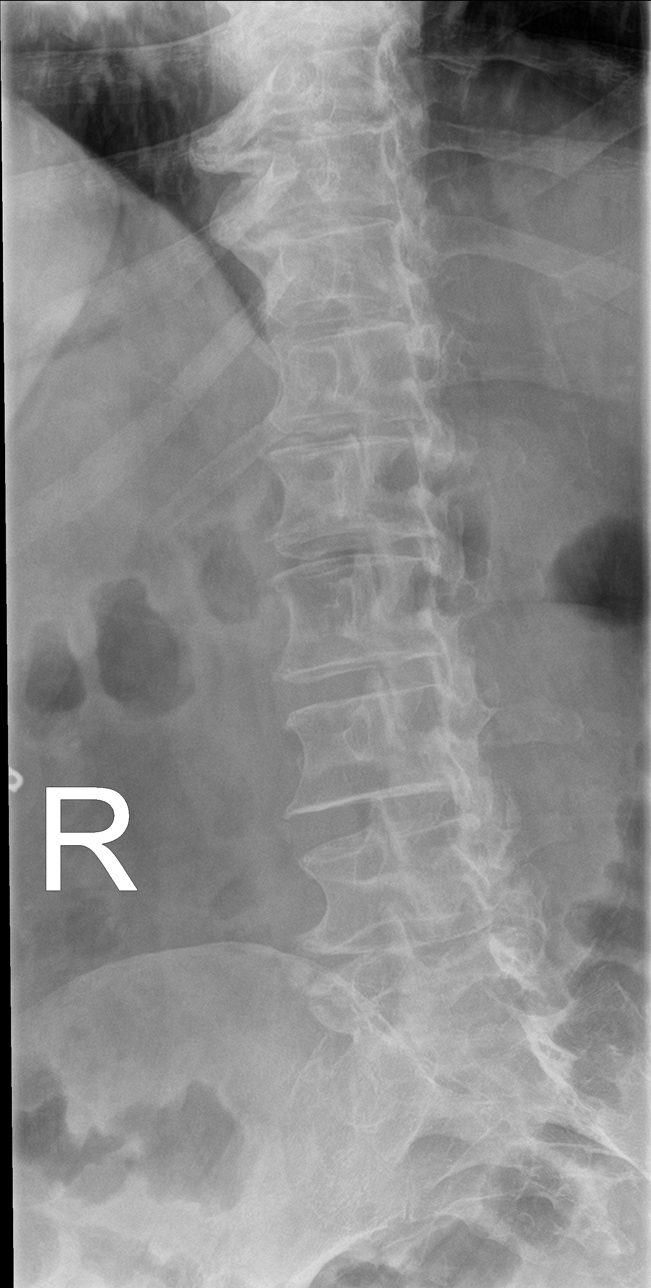

[l-spine lat]
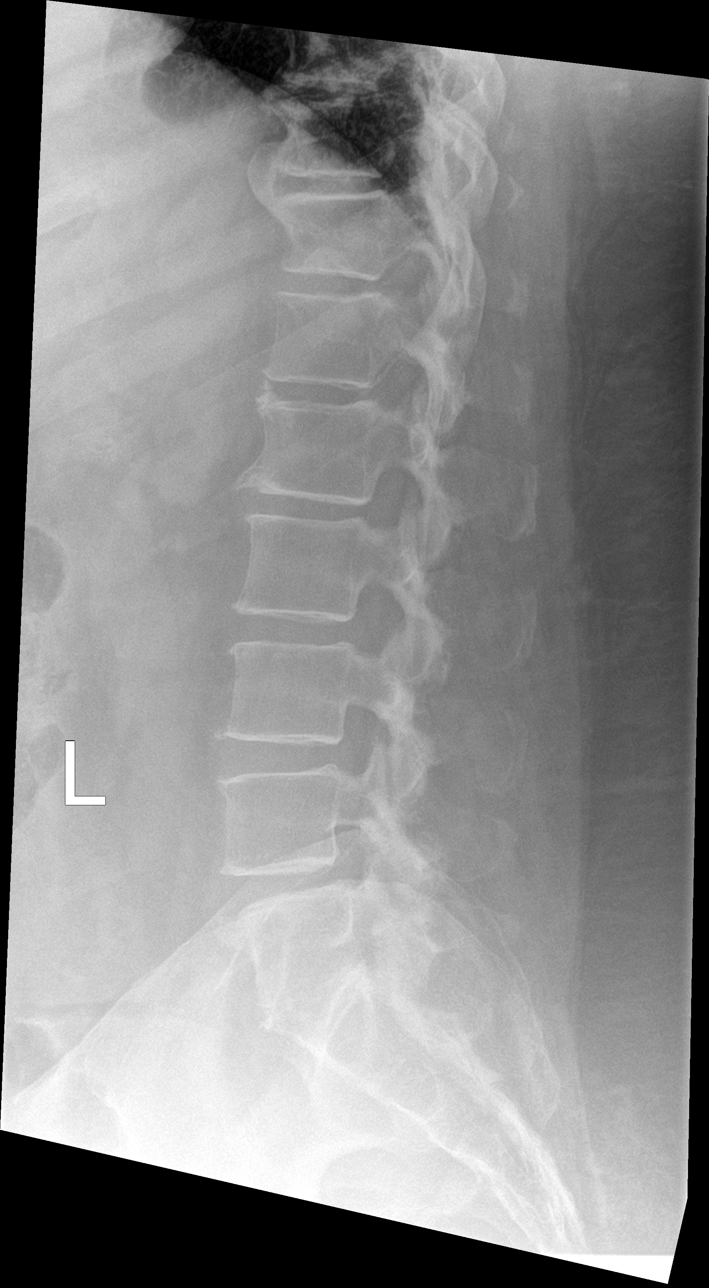

[5 of 5 positions shown; findings below may reference images not displayed]

FINDINGS: Five non-rib-bearing lumbar vertebra.

Disc space loss at L5-S1 question partial sacralization of L5.

Vertebral body and disc space heights otherwise maintained.

Scattered endplate spurs at lower thoracic and upper lumbar spine.

Minimal anterolisthesis at L4-L5.

No fracture, additional subluxation, bone destruction or
spondylolysis.

SI joints preserved.
IMPRESSION: Mild degenerative changes of the lumbar spine.

No acute abnormalities.

## 2021-09-28 IMAGING — DX DG CHEST 1V PORT
2 series · 2 of 2 positions shown · non-contrast
Comparison: Radiograph 04/06/2018.  CT 11/15/2014

CLINICAL DATA: Shortness of breath for 1 week.

EXAM:
PORTABLE CHEST 1 VIEW

[chest ap (1 of 2)]
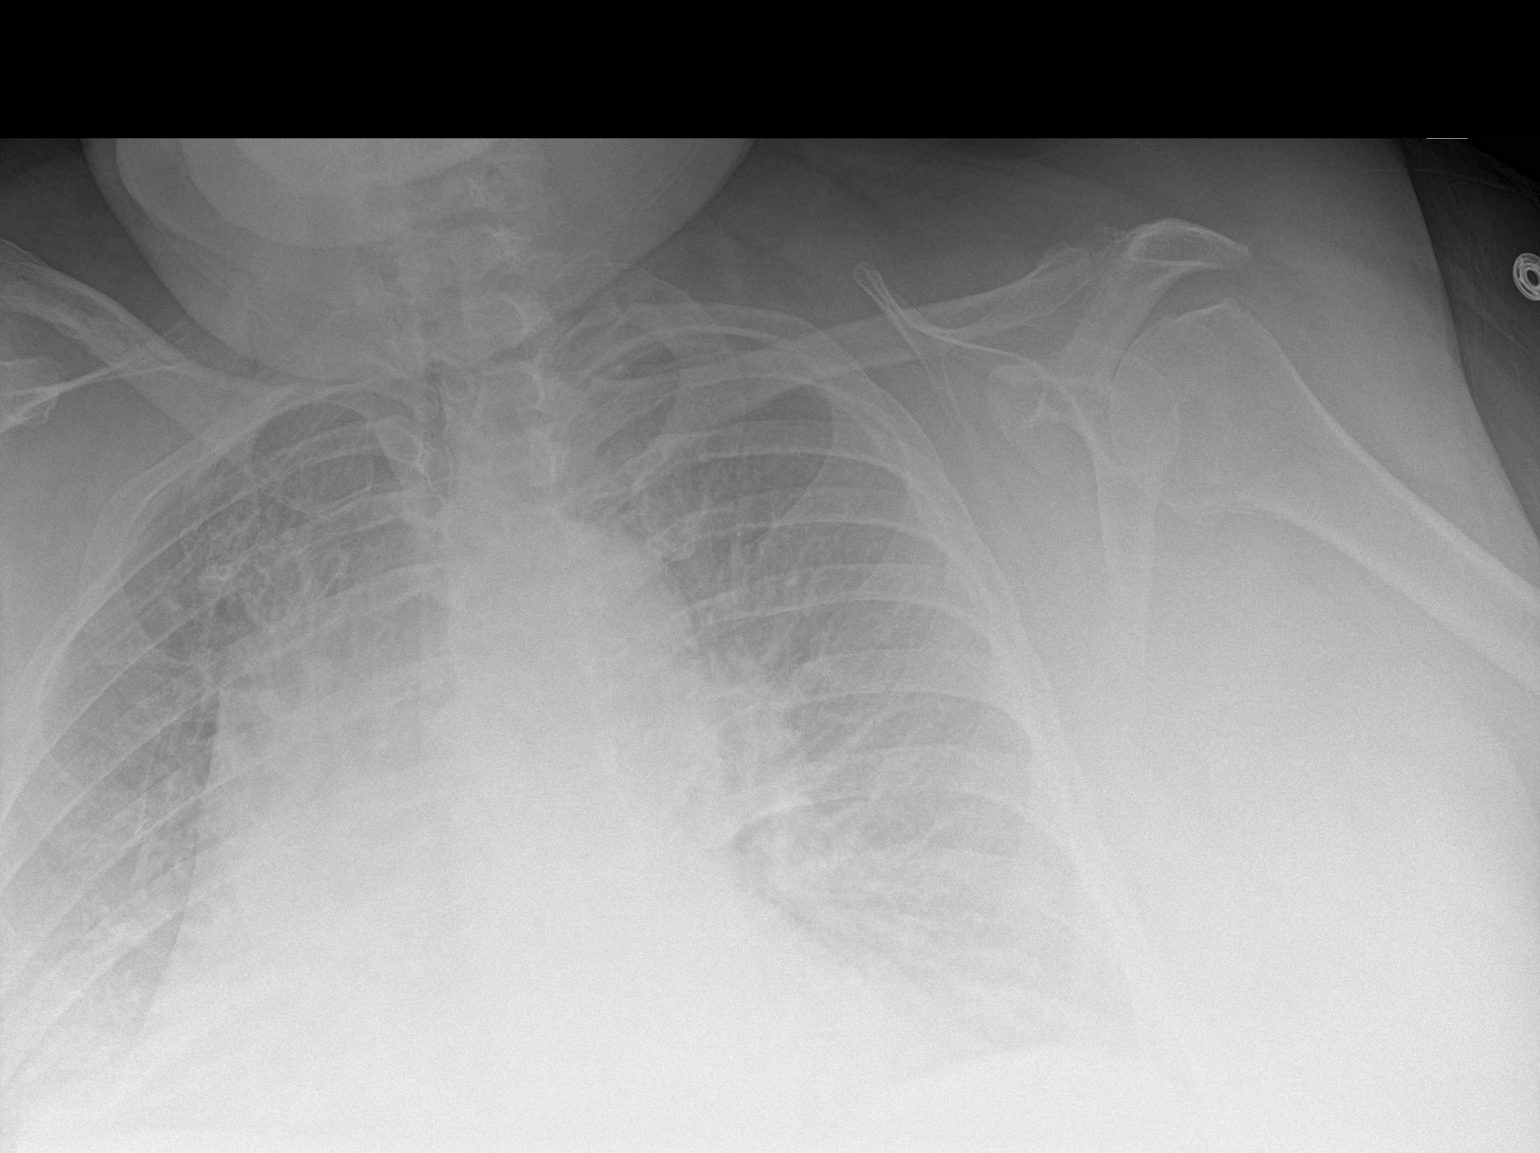

[chest ap (2 of 2)]
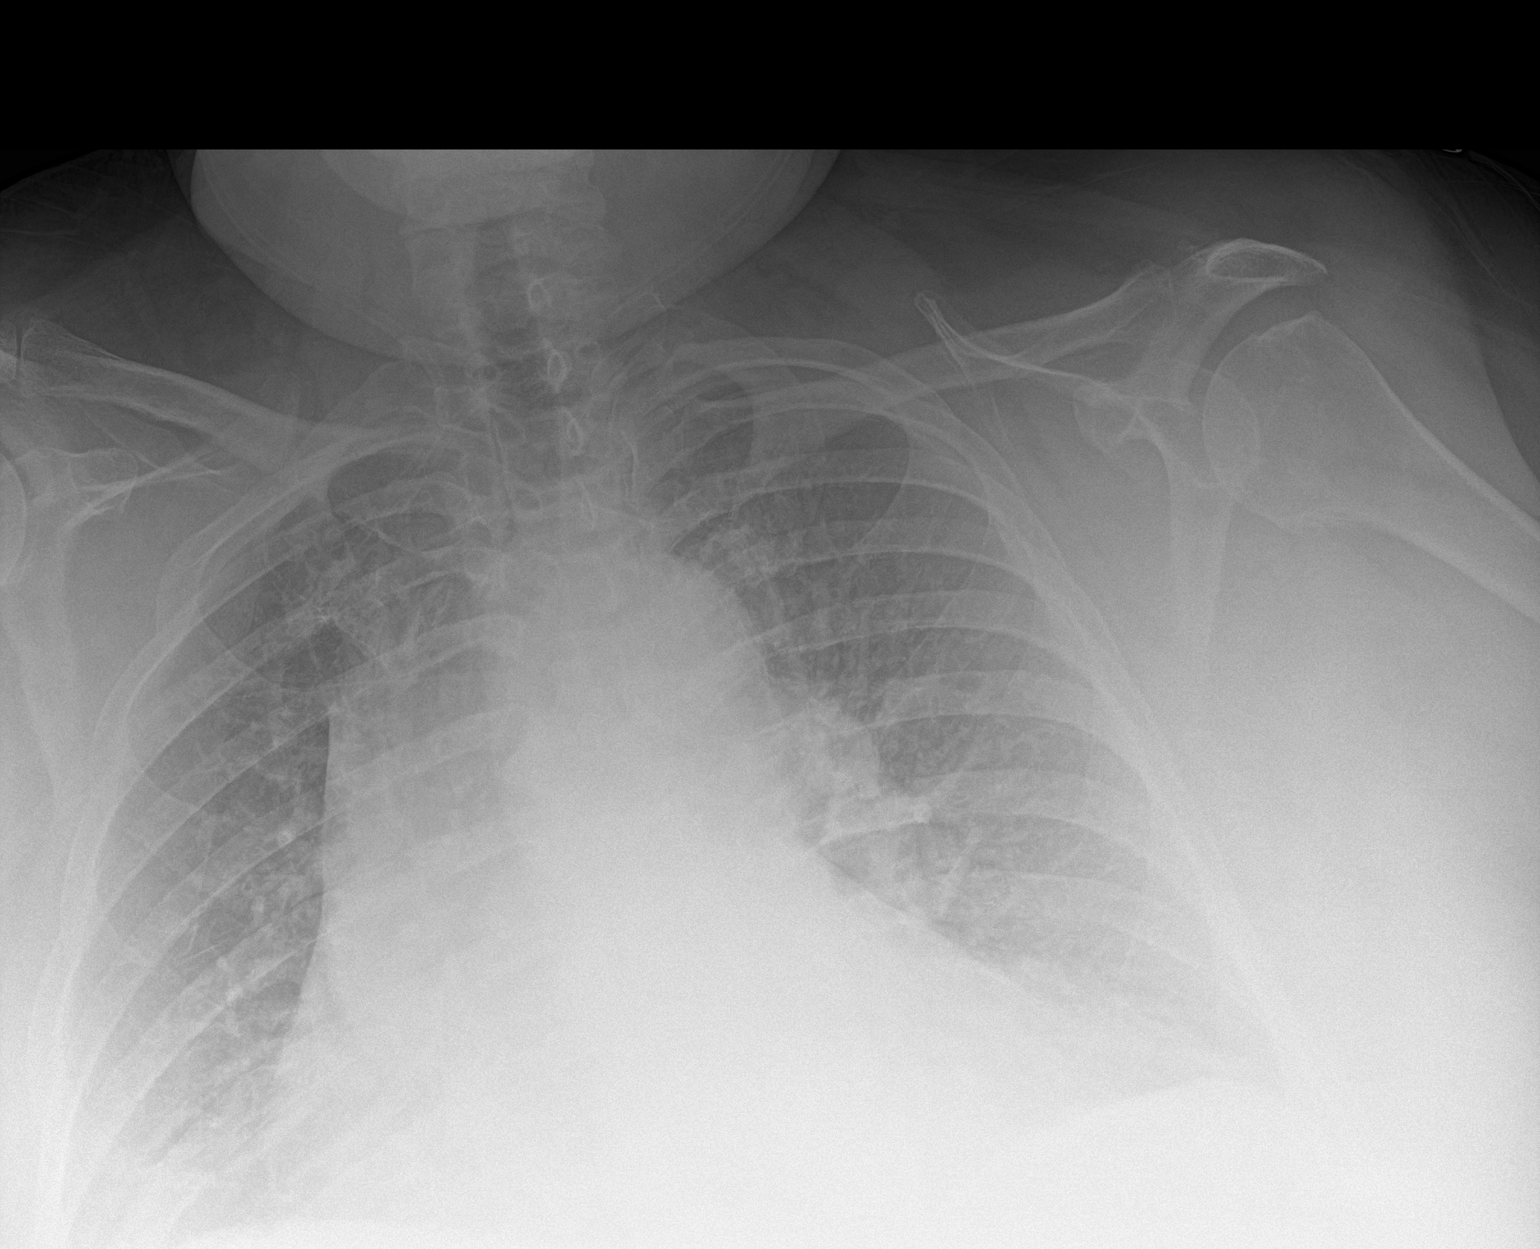

[2 of 2 positions shown; findings below may reference images not displayed]

FINDINGS: Patient had difficulty tolerating the exam. Soft tissue attenuation
from body habitus further limits evaluation. Enlarged
cardiopericardial silhouette, likely in part related to mediastinal
lipomatosis is seen on prior exam. Findings are not significantly
changed. No focal airspace disease. No pneumothorax. No large
pleural effusion, however right costophrenic angle is not included
in the field of view.
IMPRESSION: Cardiomegaly, which may be in part related to mediastinal
lipomatosis as seen on prior exam. No definite acute pulmonary
process, however soft tissue attenuation from habitus limits
detailed assessment.

## 2021-09-28 IMAGING — CT CT ANGIO CHEST
2 of 7 series · 18 of 46 positions shown · IV contrast (APPLIED)
Comparison: Radiograph earlier this day.  Chest CT 11/15/2014

CLINICAL DATA: Shortness of breath. Patient reports bed bug bites
all over.

EXAM:
CT ANGIOGRAPHY CHEST WITH CONTRAST
TECHNIQUE: Multidetector CT imaging of the chest was performed using the
standard protocol during bolus administration of intravenous
contrast. Multiplanar CT image reconstructions and MIPs were
obtained to evaluate the vascular anatomy.
CONTRAST:  80mL OMNIPAQUE IOHEXOL 350 MG/ML SOLN

[Series 7: thins · axial · 0.90mm/px · z∈[+950,+1237]mm · 15 of 462 slices shown]
[im 26/462  lung]
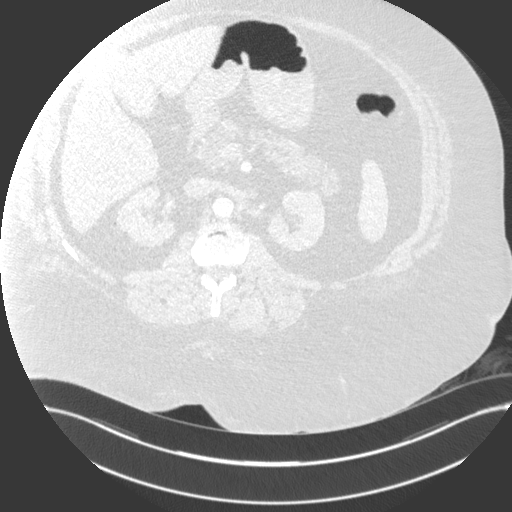
[im 52/462  soft-tissue]
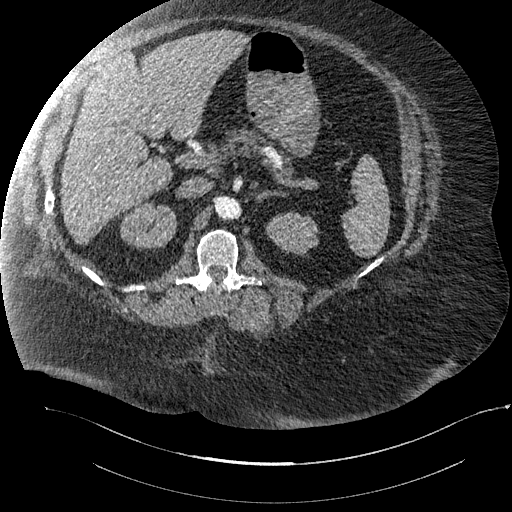
[im 77/462  lung]
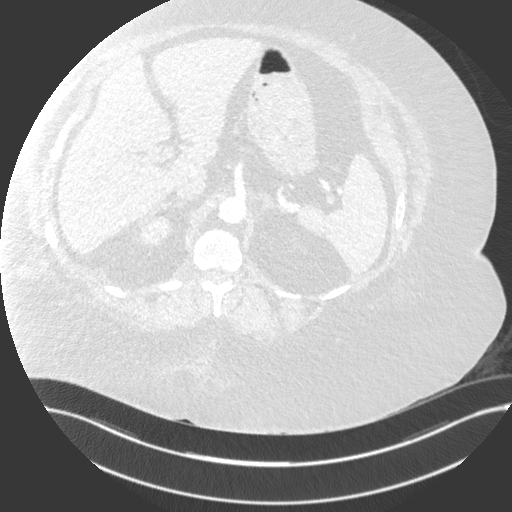
[im 103/462  soft-tissue]
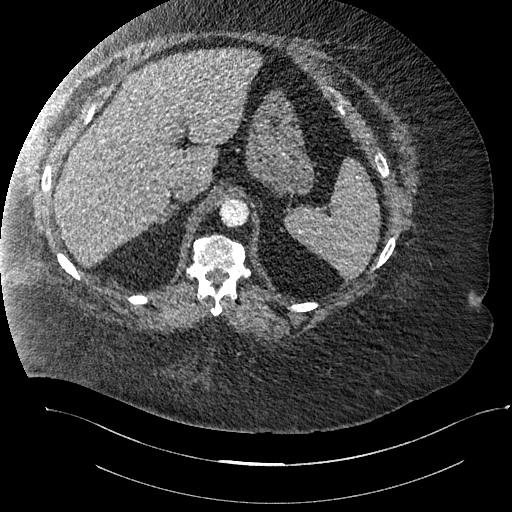
[im 154/462  lung]
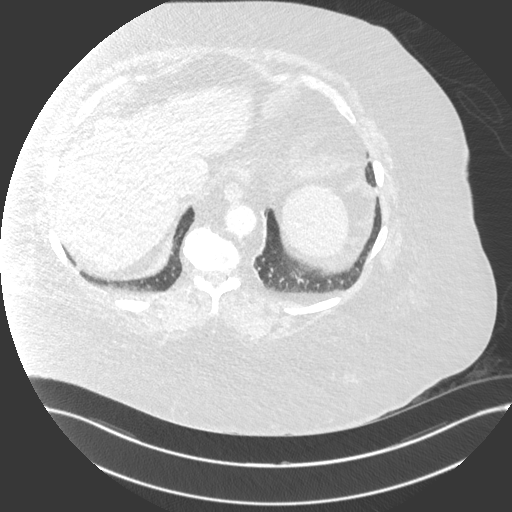
[im 180/462  soft-tissue]
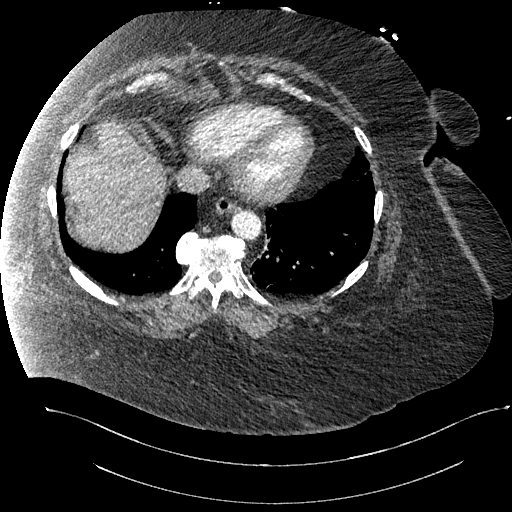
[im 205/462  lung]
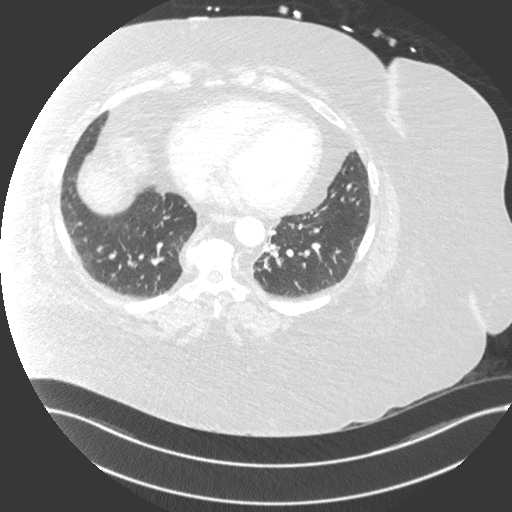
[im 231/462  soft-tissue]
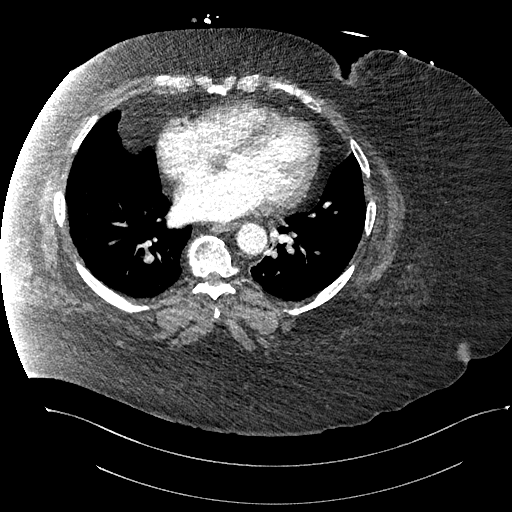
[im 257/462  lung]
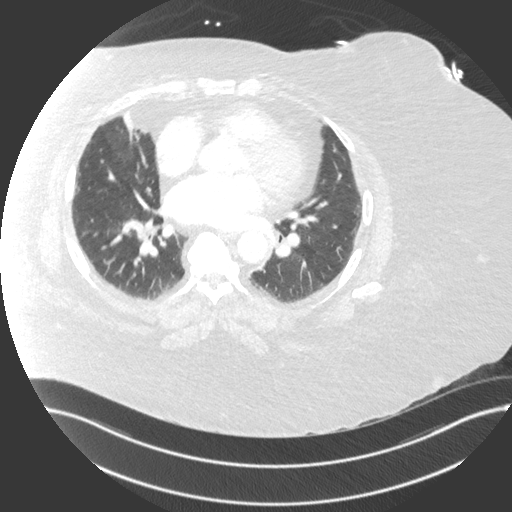
[im 282/462  soft-tissue]
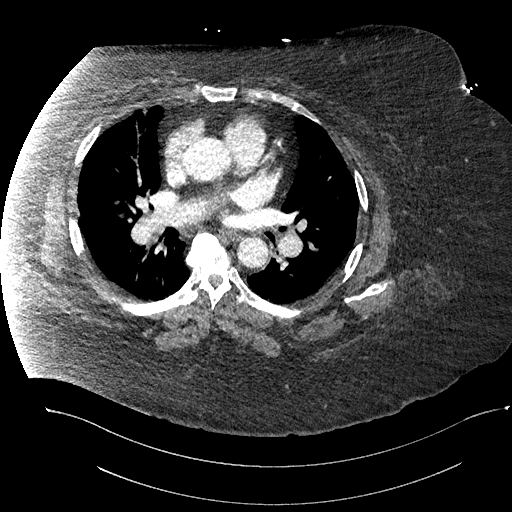
[im 308/462  lung]
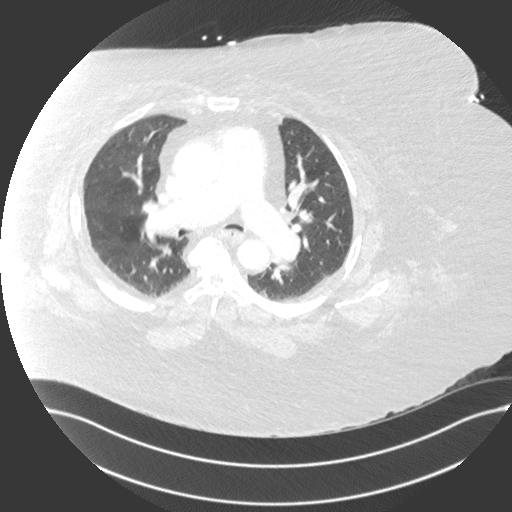
[im 359/462  soft-tissue]
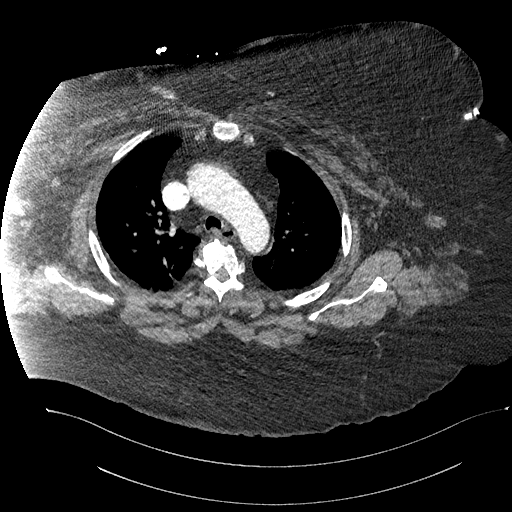
[im 385/462  lung]
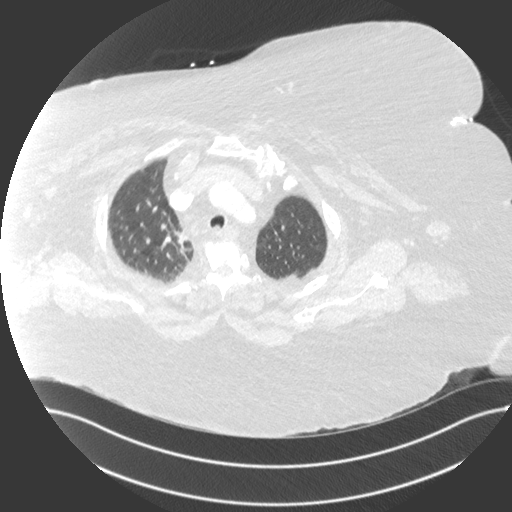
[im 410/462  soft-tissue]
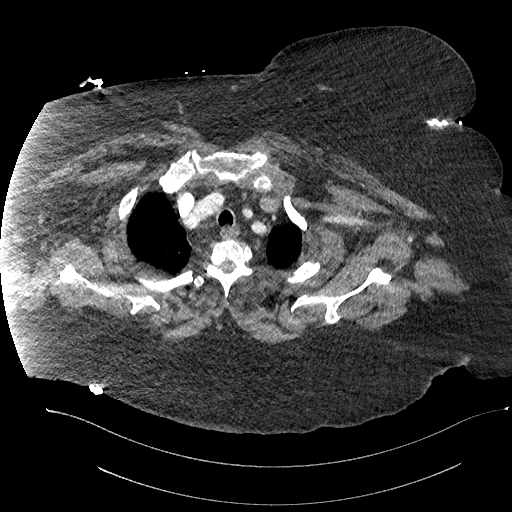
[im 436/462  lung]
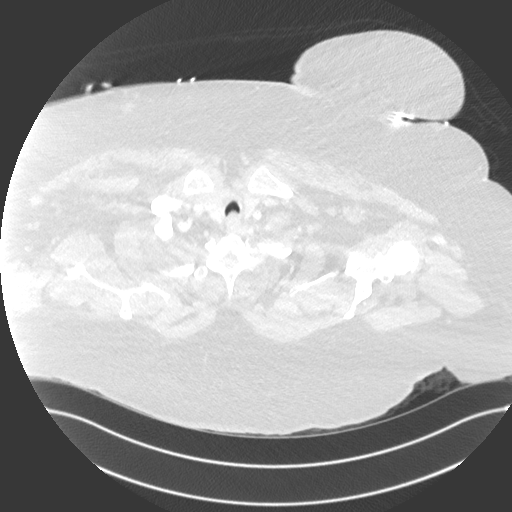

[Series 8: cor · coronal · 0.69mm/px · 3 of 207 slices shown]
[im 52/207  soft-tissue]
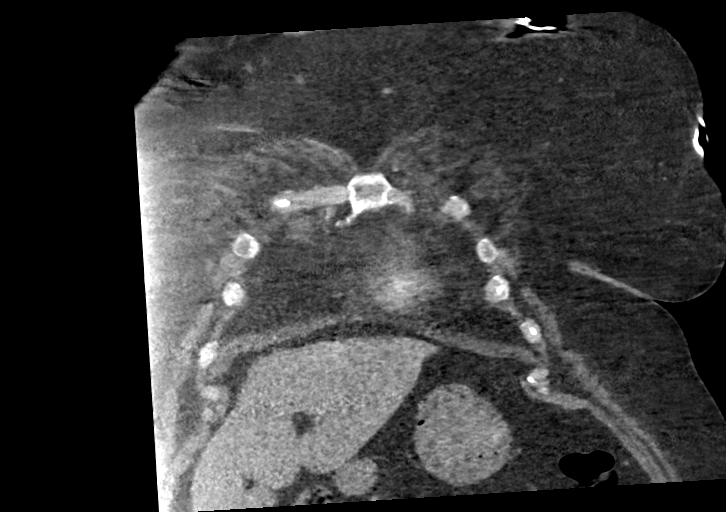
[im 104/207  soft-tissue]
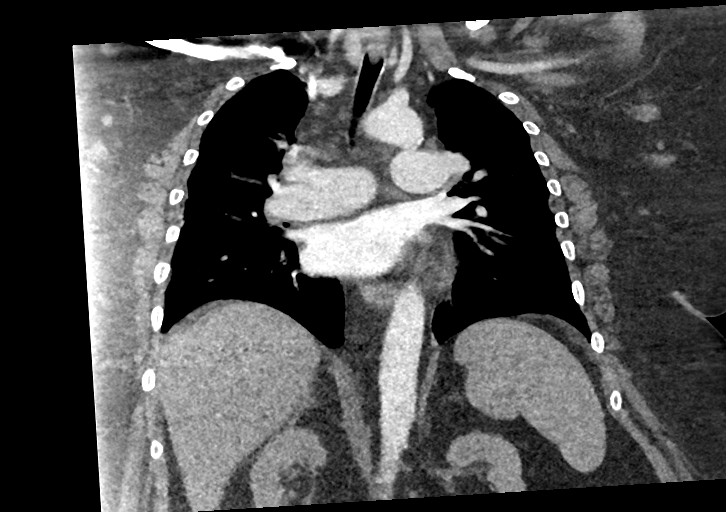
[im 155/207  soft-tissue]
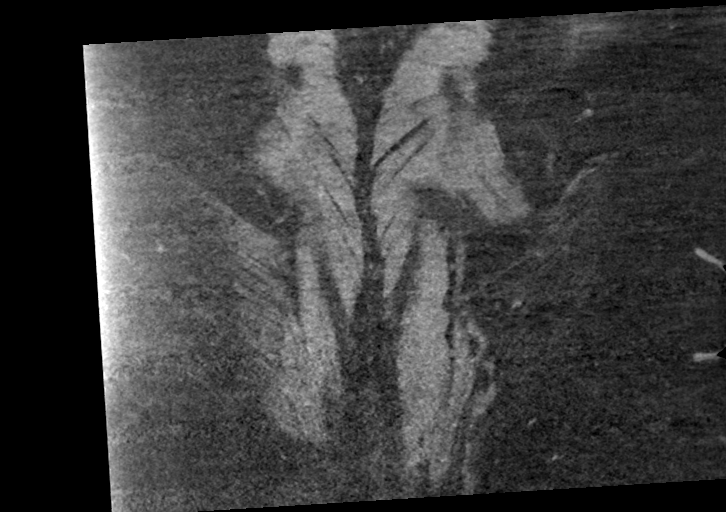

[18 of 46 positions shown; findings below may reference images not displayed]

FINDINGS: Cardiovascular: There are no filling defects within the pulmonary
arteries to suggest pulmonary embolus. Evaluation is diagnostic to
the segmental level. Cannot assess subsegmental branches given
contrast bolus timing and soft tissue attenuation from habitus. Mild
cardiomegaly. There are coronary artery calcifications. No aortic
dissection. Conventional branching pattern from the aortic arch.

Mediastinum/Nodes: No enlarged mediastinal or hilar lymph nodes.
Prominent 12 mm left axillary lymph node. No definite right axillary
adenopathy, evaluation slightly limited by streak artifact secondary
to patient's body wall abutting the CT gantry. Decompressed
esophagus. No thyroid nodule.

Lungs/Pleura: Mild linear atelectasis in the lower lobes and right
middle lobe. No confluent airspace disease. Mild central bronchial
thickening. No pulmonary edema. No pleural fluid.

Upper Abdomen: No acute findings.

Musculoskeletal: There are no acute or suspicious osseous
abnormalities. There is degenerative change in the spine.

Review of the MIP images confirms the above findings.
IMPRESSION: 1. No pulmonary embolus.
2. Scattered linear atelectasis within both lungs. No other acute
intrathoracic abnormality.
3. Prominent left axillary lymph node at 12 mm, nonspecific.
Recommend up-to-date mammography.
4. Mild cardiomegaly.

## 2021-12-30 IMAGING — CR DG CHEST 2V
2 series · 2 of 2 positions shown · non-contrast
Comparison: CT angiogram chest 03/12/2019, chest radiograph
03/12/2019

CLINICAL DATA: Shortness of breath. Patient reports bilateral lower
leg pain, redness and swelling.

EXAM:
CHEST - 2 VIEW

[chest lat]
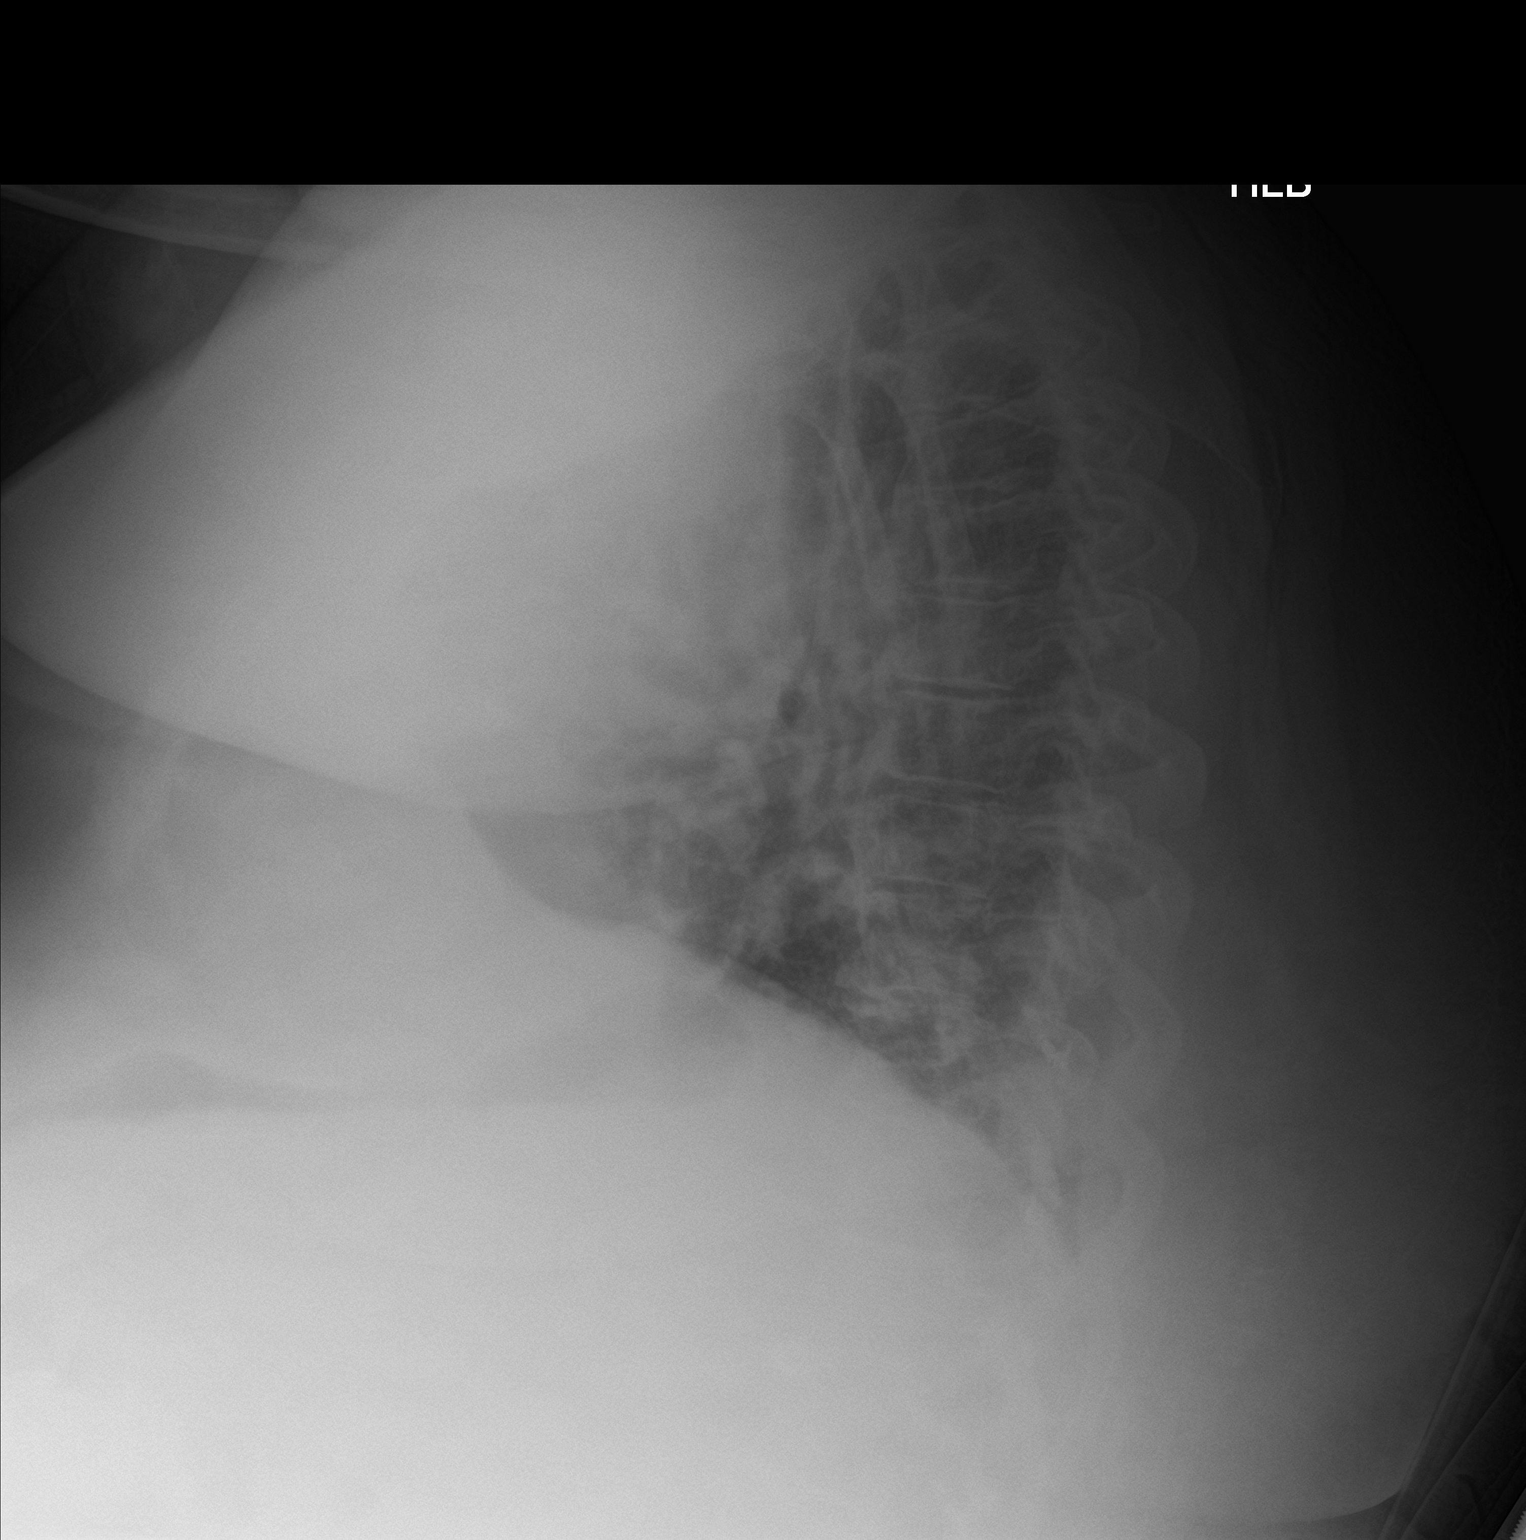

[chest ap]
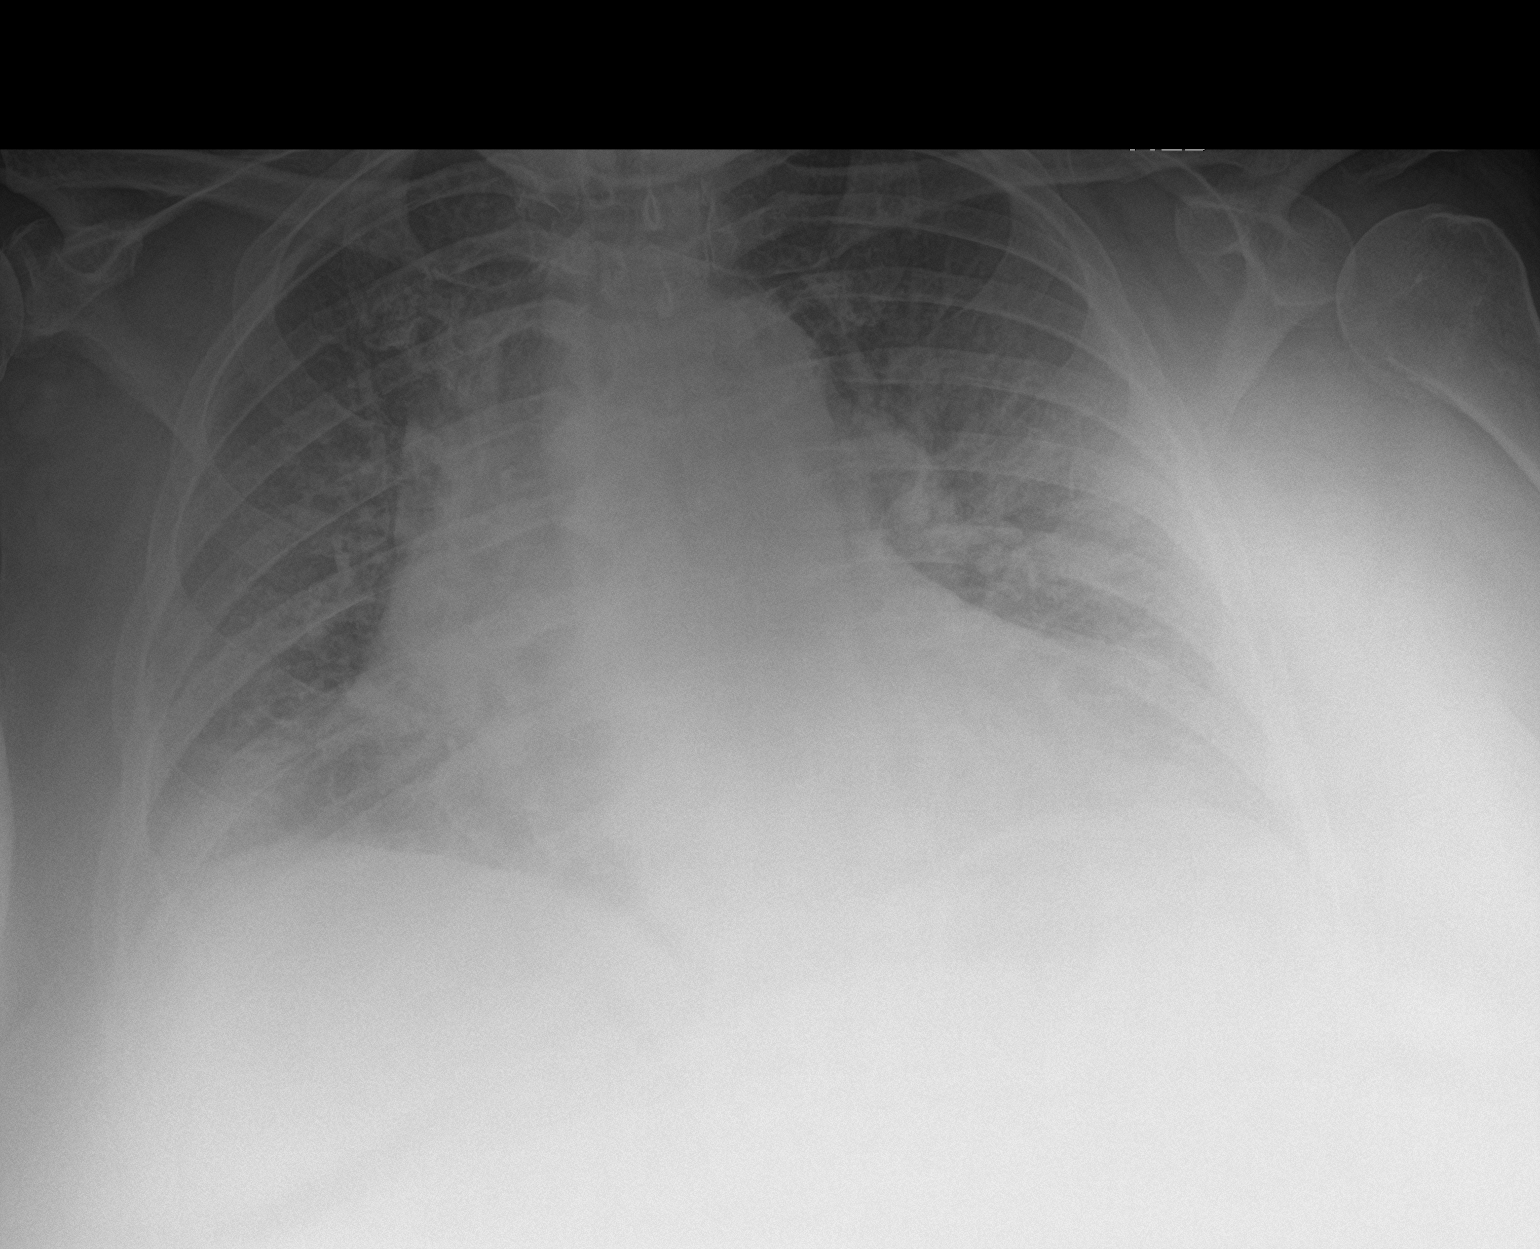

[2 of 2 positions shown; findings below may reference images not displayed]

FINDINGS: Limited evaluation due to patient body habitus and shallow
inspiration. Mild cardiomegaly appears unchanged. Mild ill-defined
opacity within the lateral right lung base. No evidence of pleural
effusion or pneumothorax. No acute bony abnormality.
IMPRESSION: Limited examination as described.

Shallow inspiration radiograph. Mild ill-defined opacity within the
lateral right lung base which may reflect atelectasis. Pneumonia
cannot be excluded.

Unchanged mild cardiomegaly.

## 2021-12-30 IMAGING — CR DG TIBIA/FIBULA 2V*R*
4 series · 4 of 4 positions shown · non-contrast
Comparison: None.

CLINICAL DATA: Bilateral lower leg pain. Redness and swelling.

EXAM:
RIGHT TIBIA AND FIBULA - 2 VIEW

[tibia ap (1 of 2)]
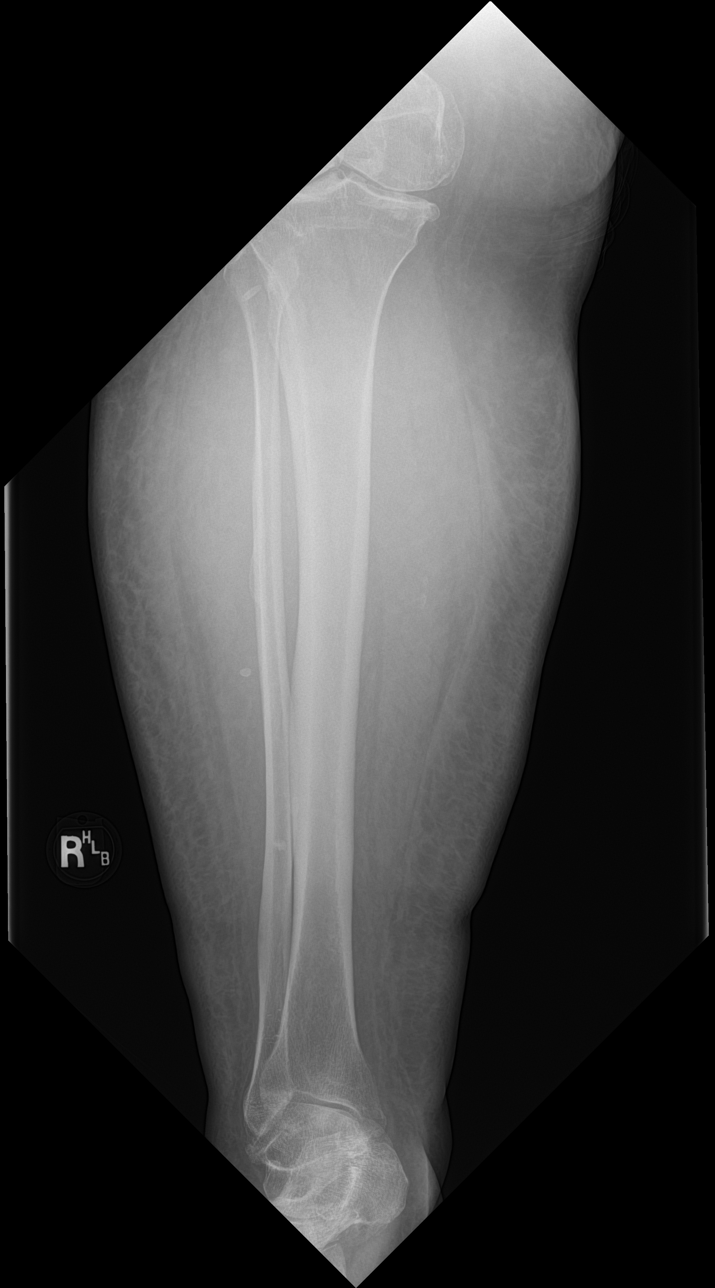

[tibia ap (2 of 2)]
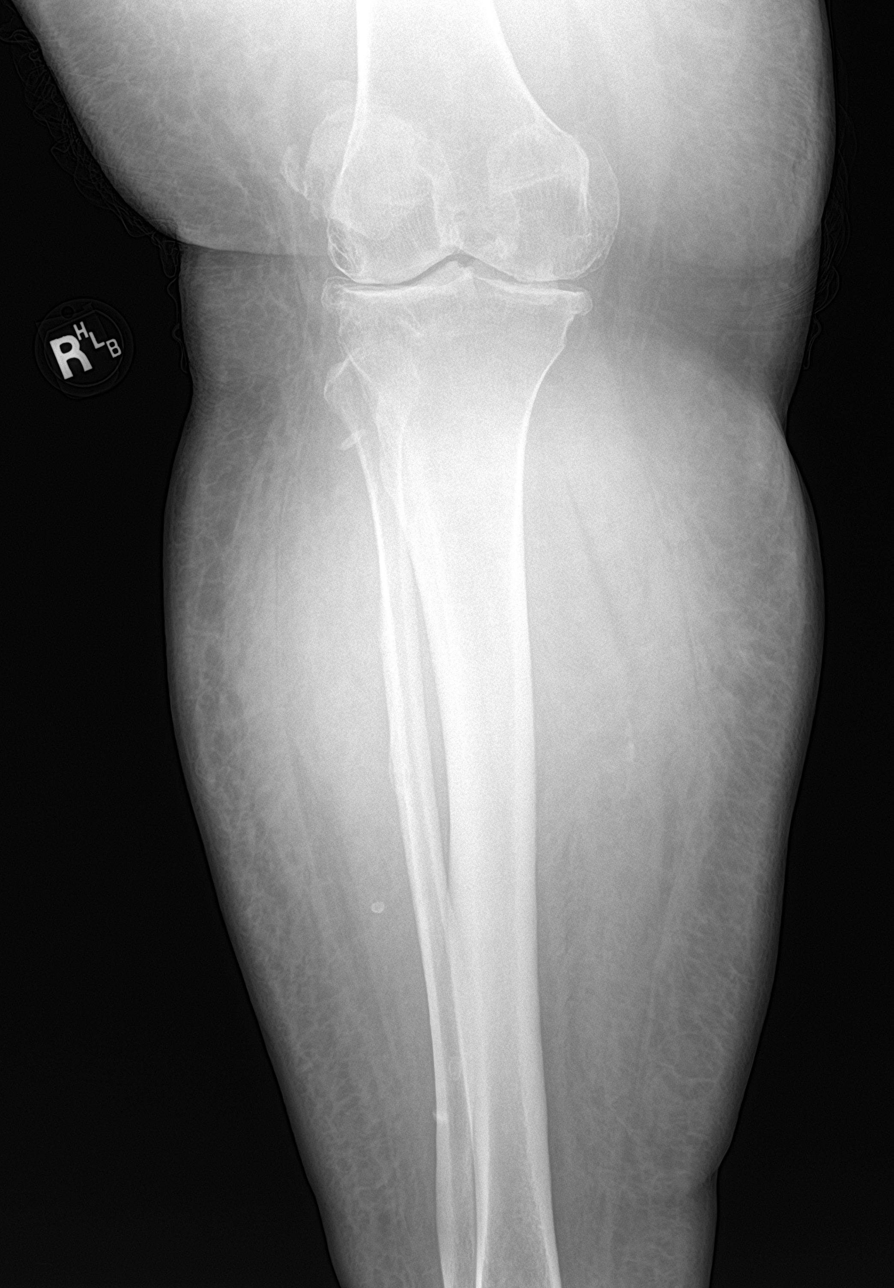

[tibia lat (1 of 2)]
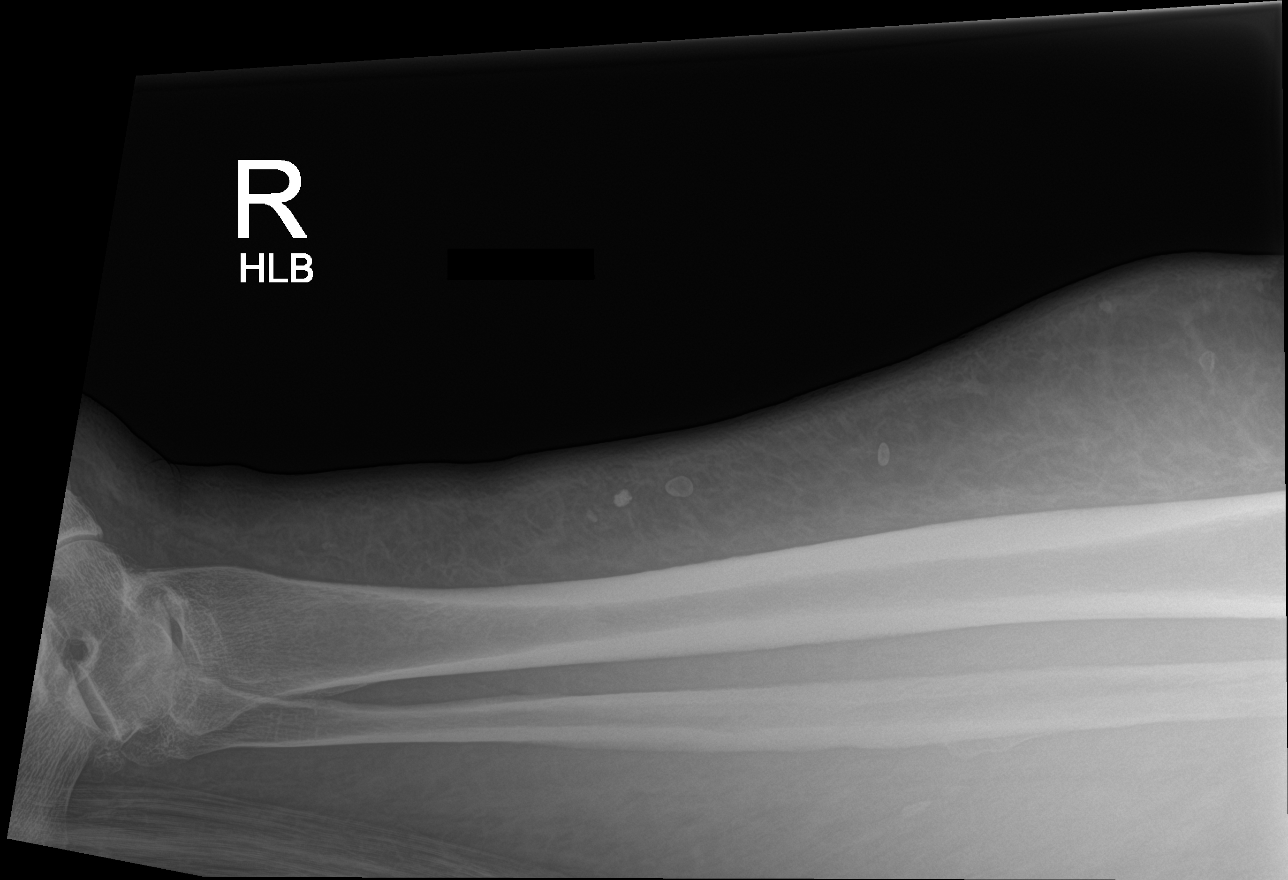

[tibia lat (2 of 2)]
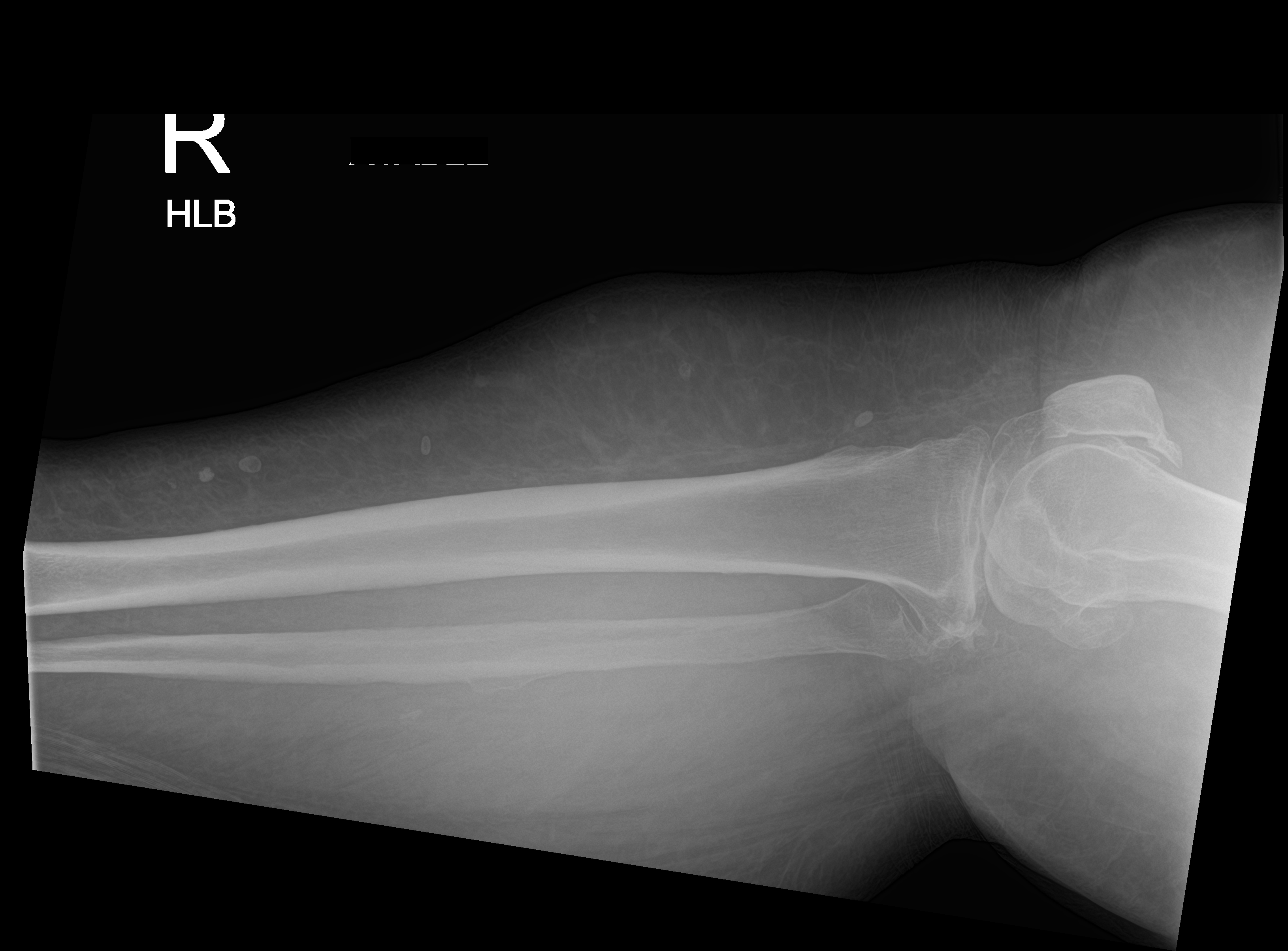

[4 of 4 positions shown; findings below may reference images not displayed]

FINDINGS: Cortical margins of the tibia and fibula are intact. No fracture. No
bony destruction or periosteal reaction. Moderate osteoarthritis of
the knee joint. Mild osteoarthritis of the ankle. Generalized soft
tissue edema and soft tissue prominence. No soft tissue air or
radiopaque foreign body. Multiple phleboliths in the soft tissues.
IMPRESSION: Generalized soft tissue edema. No acute osseous abnormality.

## 2021-12-30 IMAGING — CR DG TIBIA/FIBULA 2V*L*
4 series · 4 of 4 positions shown · non-contrast
Comparison: None.

CLINICAL DATA: Bilateral lower leg pain. Redness and swelling.

EXAM:
LEFT TIBIA AND FIBULA - 2 VIEW

[tibia ap (1 of 2)]
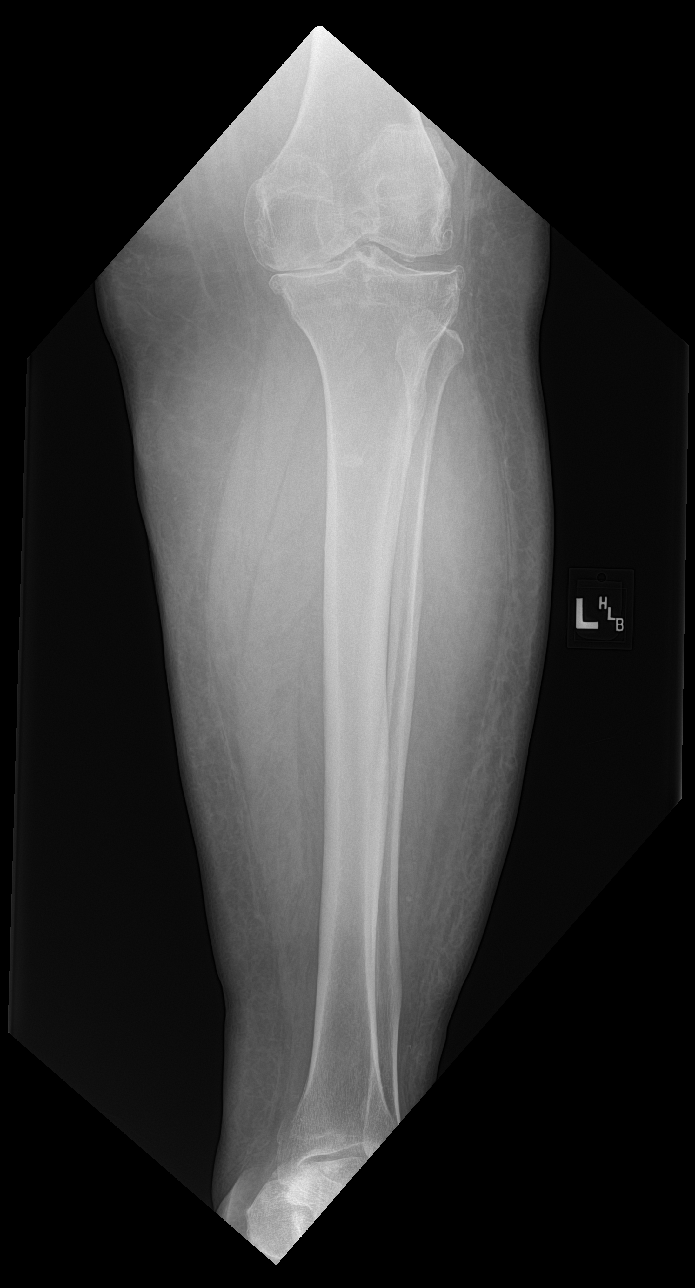

[tibia ap (2 of 2)]
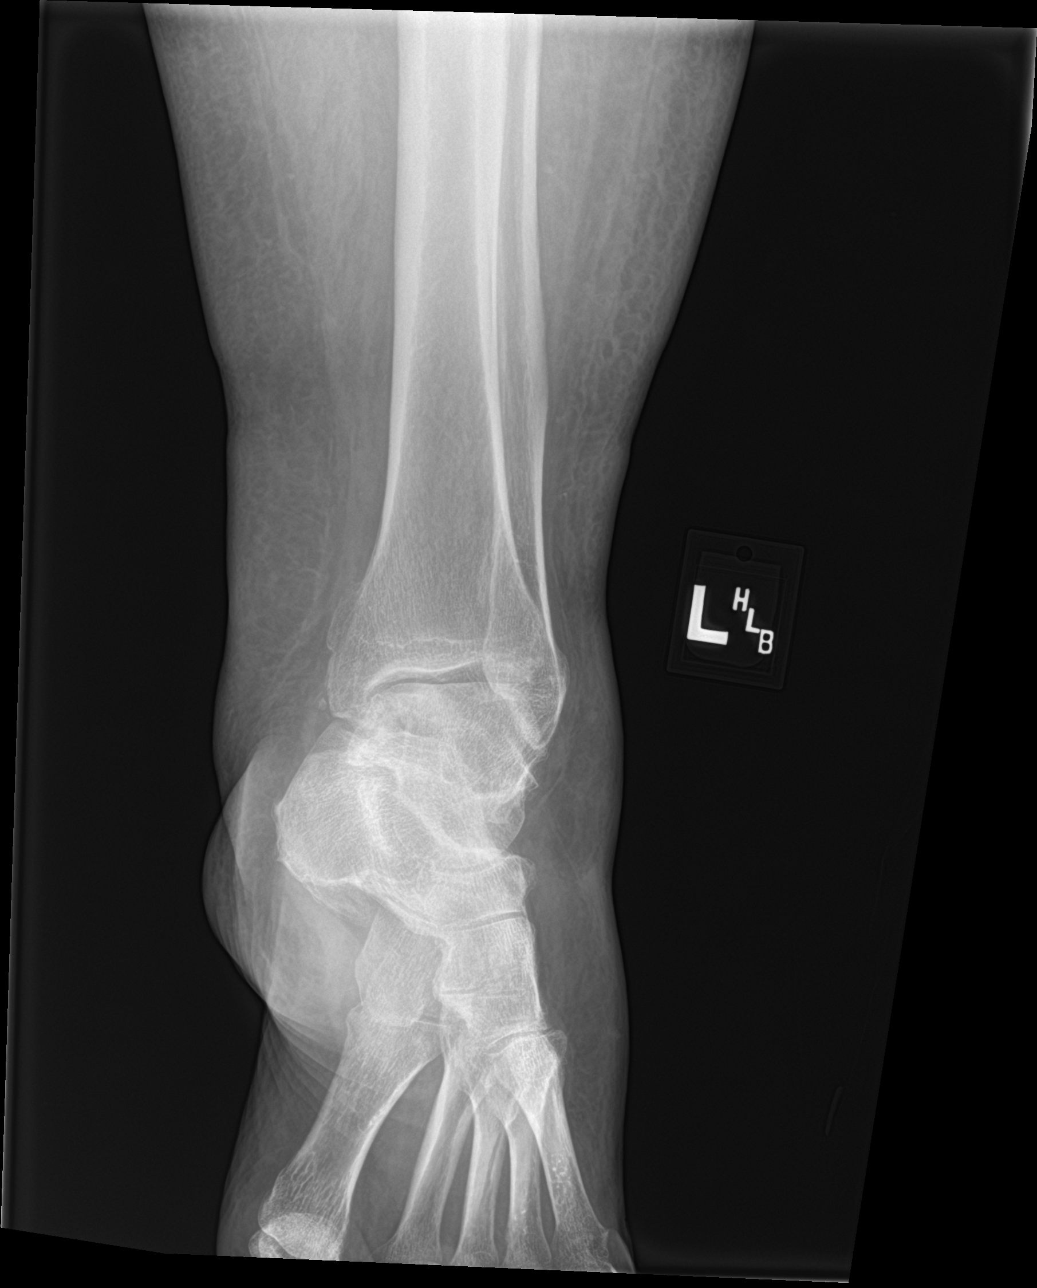

[tibia lat (1 of 2)]
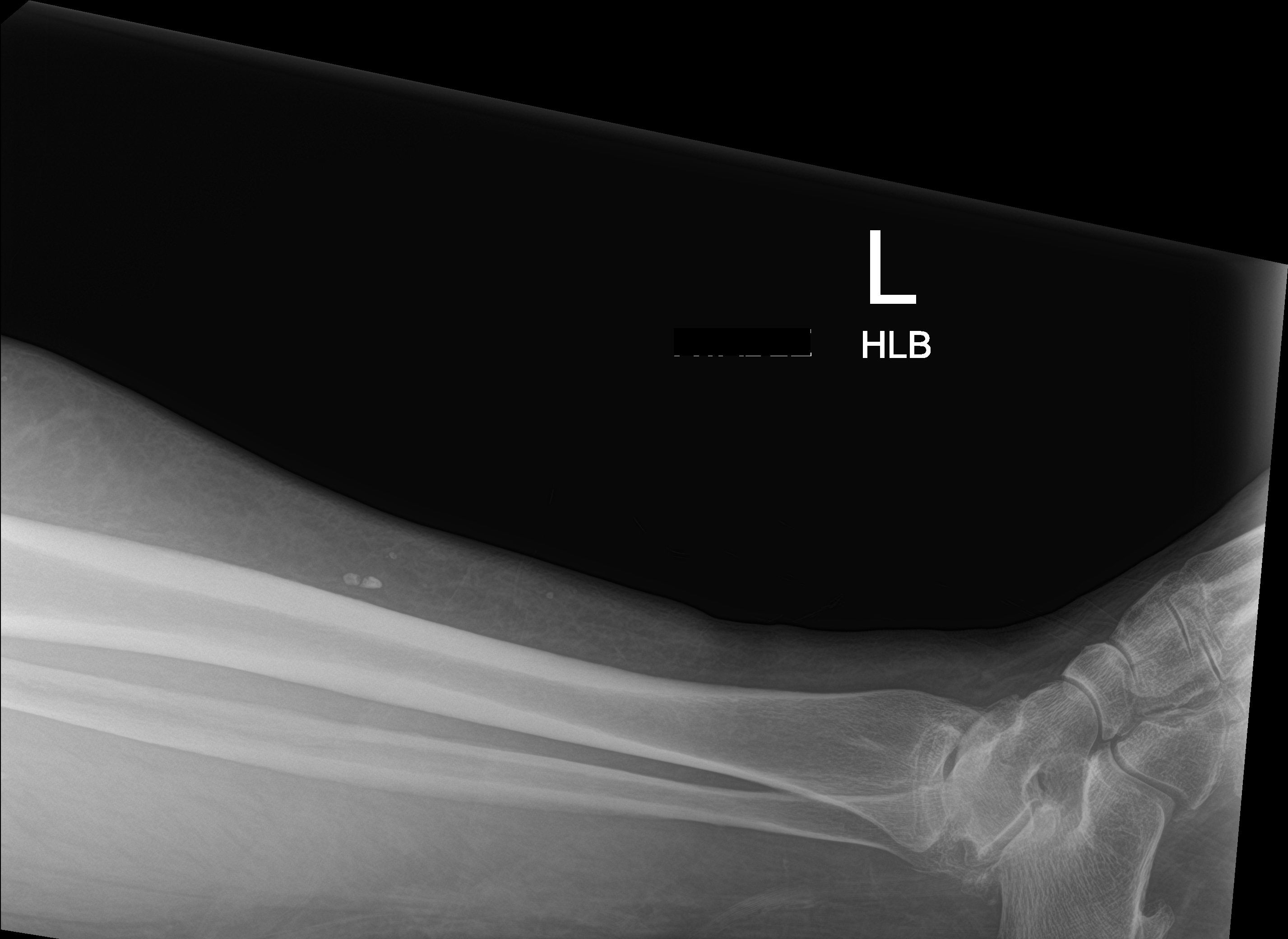

[tibia lat (2 of 2)]
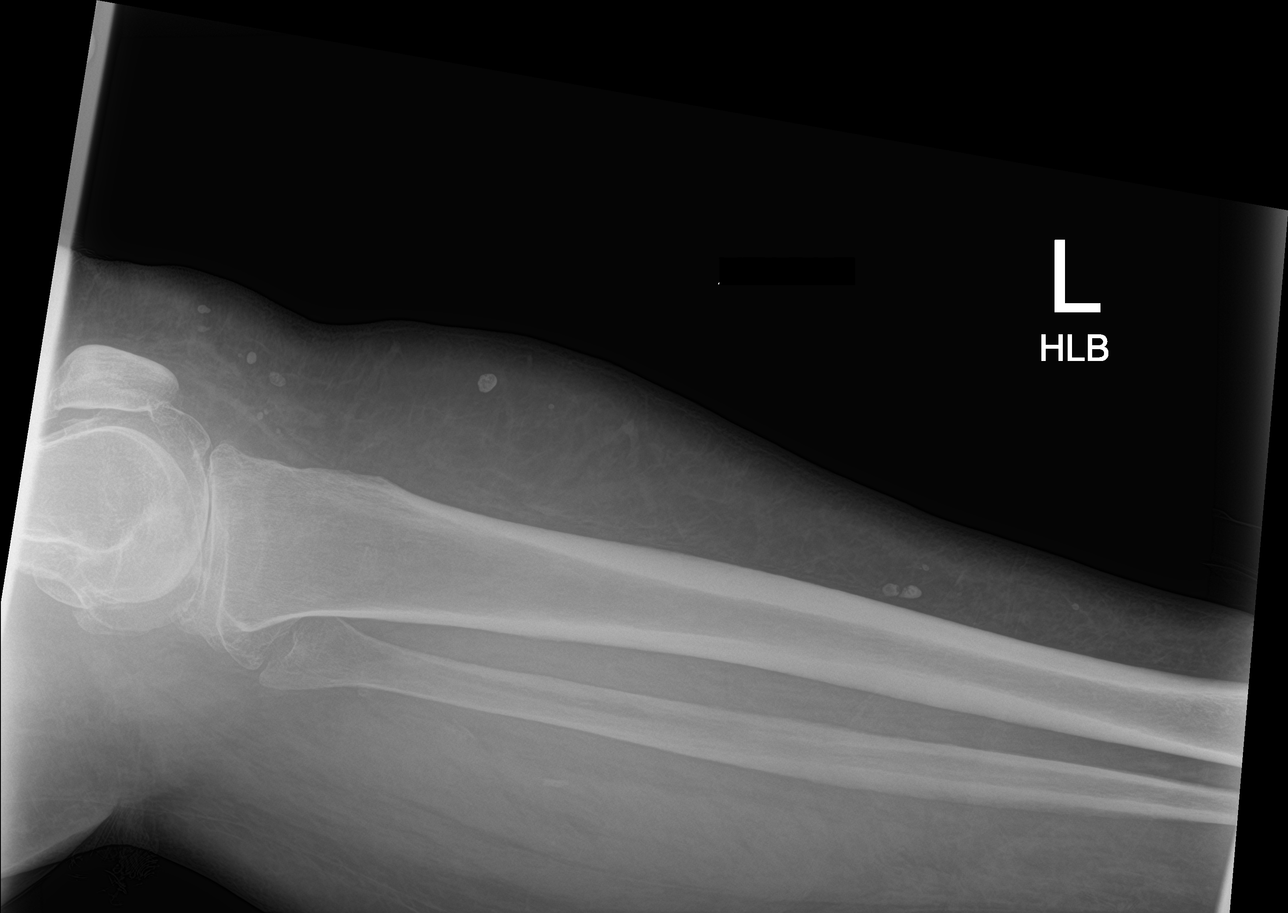

[4 of 4 positions shown; findings below may reference images not displayed]

FINDINGS: Cortical margins of the tibia and fibula are intact. No fracture. No
bony destruction or periosteal reaction. Moderate osteoarthritis of
the knee joint. Mild osteoarthritis of the ankle. Generalized soft
tissue edema and soft tissue prominence. No soft tissue air or
radiopaque foreign body. Multiple phleboliths in the soft tissues.
IMPRESSION: Generalized soft tissue edema.  No acute osseous abnormality.

## 2021-12-31 IMAGING — CT CT ANGIO CHEST
2 of 6 series · 19 of 36 positions shown · IV contrast (omnipaque)
Comparison: 03/12/2019

CLINICAL DATA: Right leg pain and redness with swelling for 1 week

EXAM:
CT ANGIOGRAPHY CHEST WITH CONTRAST
TECHNIQUE: Multidetector CT imaging of the chest was performed using the
standard protocol during bolus administration of intravenous
contrast. Multiplanar CT image reconstructions and MIPs were
obtained to evaluate the vascular anatomy.
CONTRAST:  75mL OMNIPAQUE IOHEXOL 350 MG/ML SOLN

[Series 7: pe thins · axial · 0.82mm/px · z∈[+1108,+1340]mm · 18 of 370 slices shown]
[im 19/370  lung]
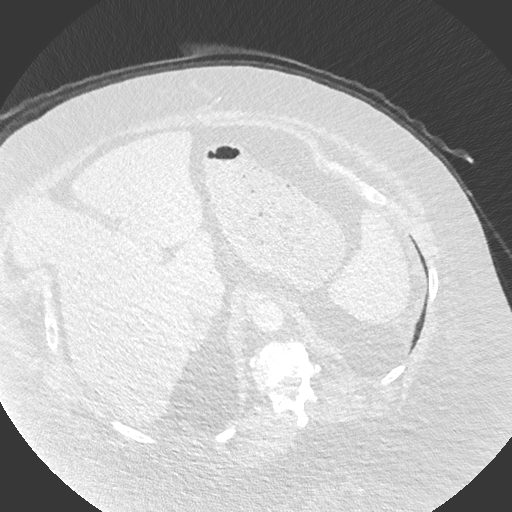
[im 37/370  mediastinal]
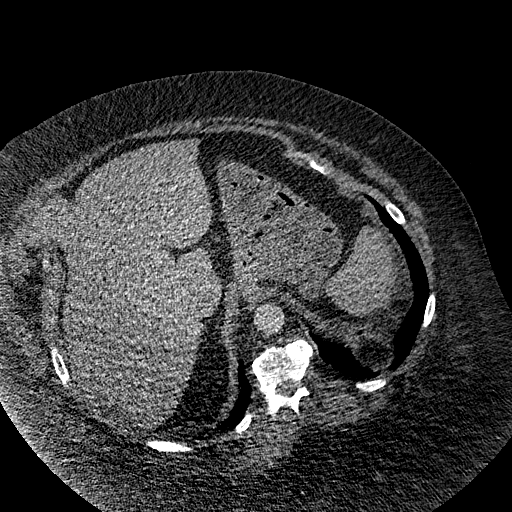
[im 56/370  lung]
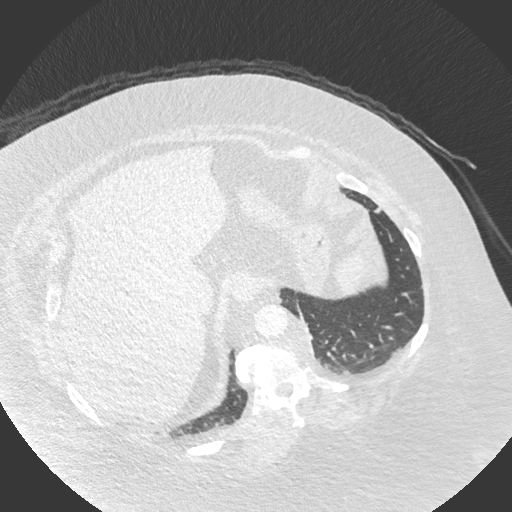
[im 74/370  mediastinal]
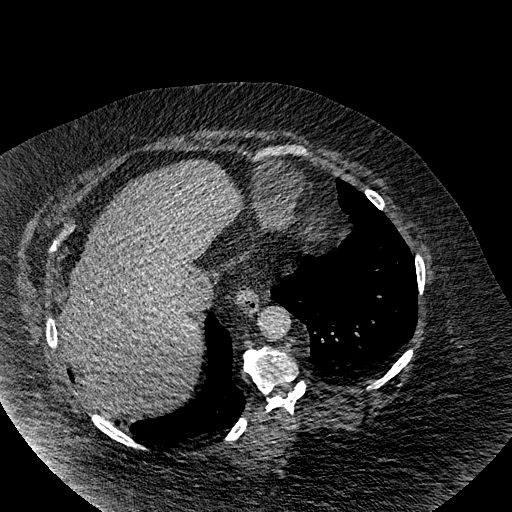
[im 93/370  lung]
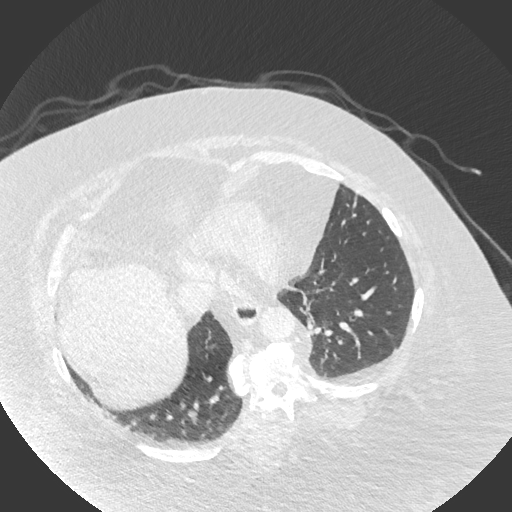
[im 111/370  mediastinal]
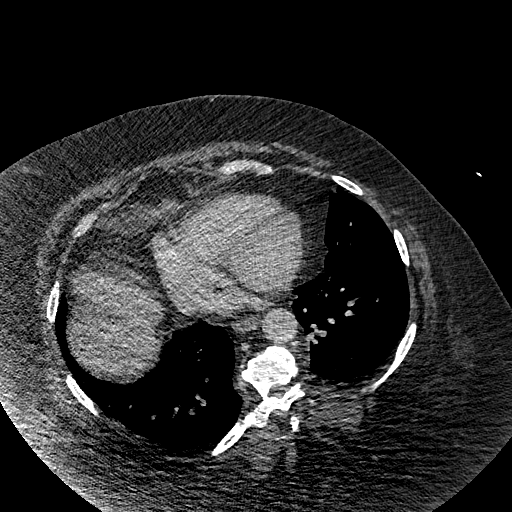
[im 130/370  lung]
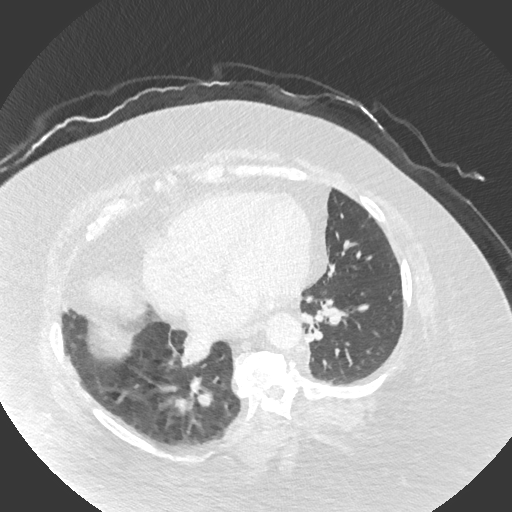
[im 148/370  mediastinal]
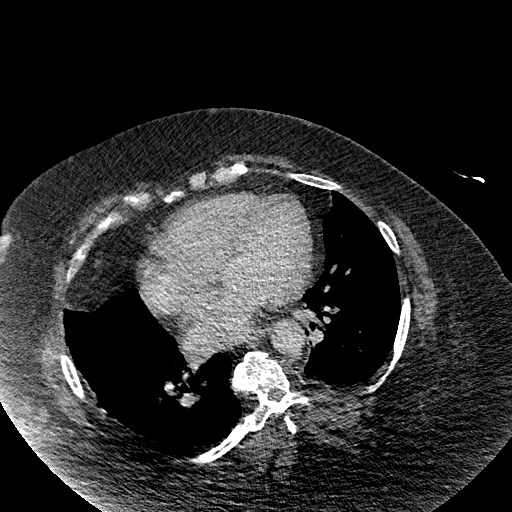
[im 167/370  lung]
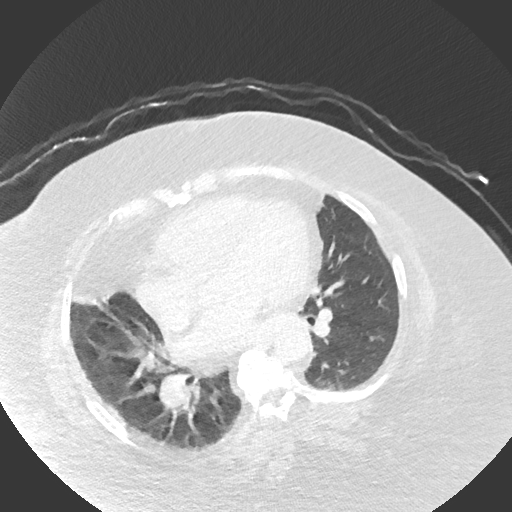
[im 203/370  mediastinal]
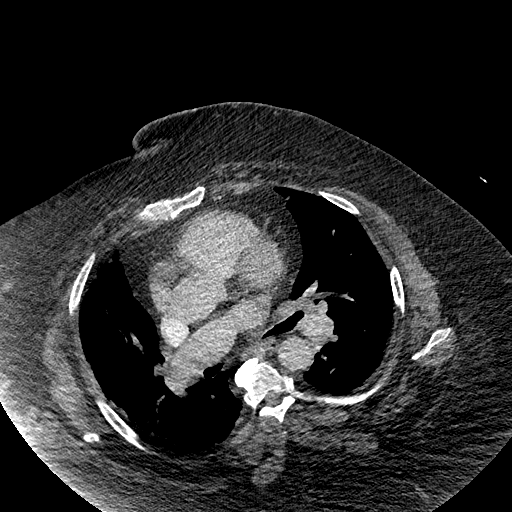
[im 222/370  lung]
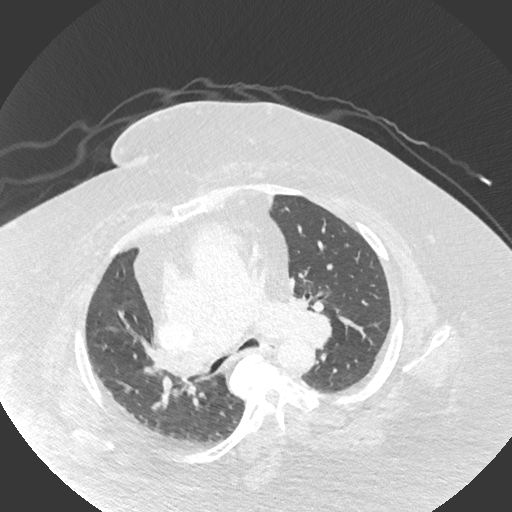
[im 240/370  mediastinal]
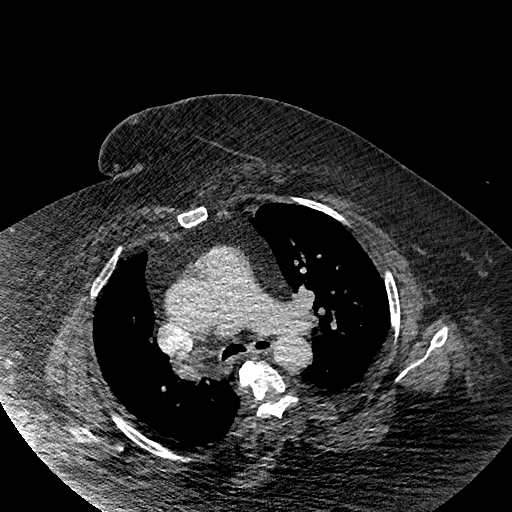
[im 259/370  lung]
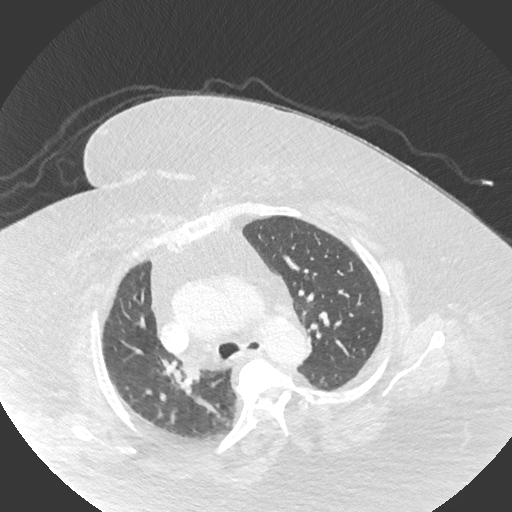
[im 277/370  mediastinal]
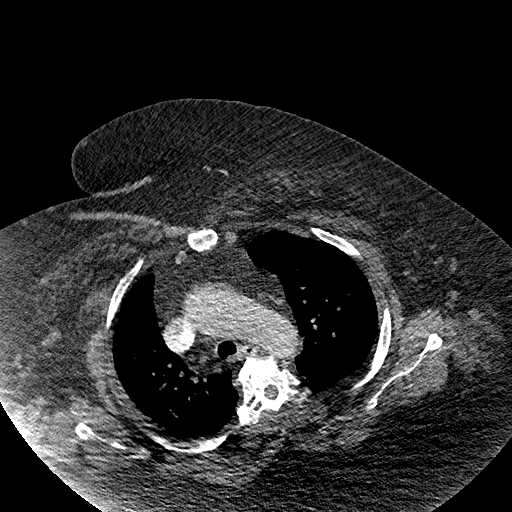
[im 296/370  lung]
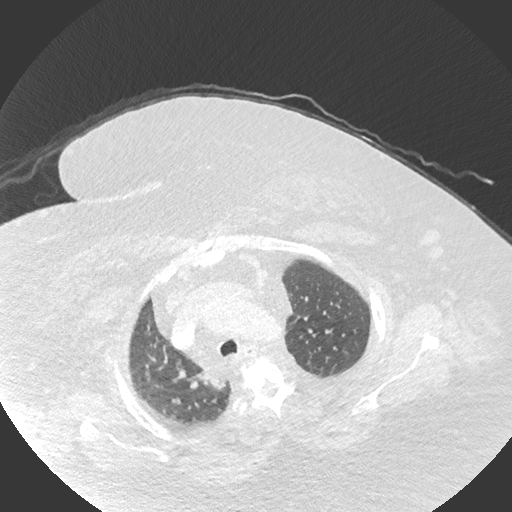
[im 314/370  mediastinal]
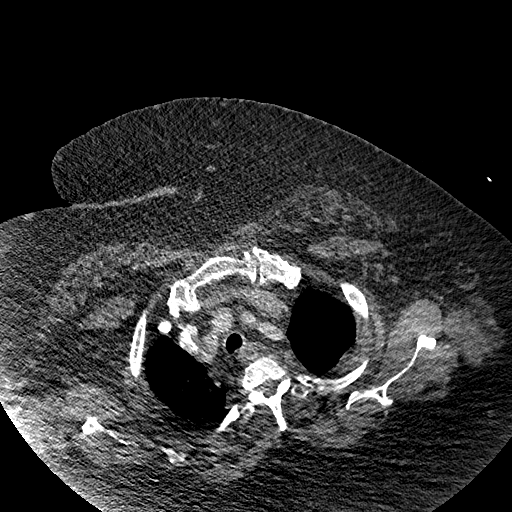
[im 333/370  lung]
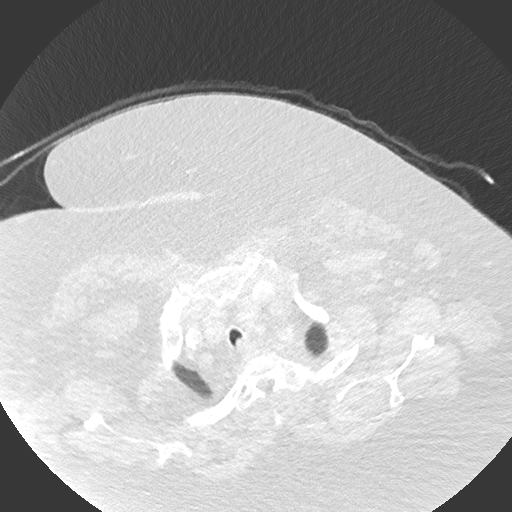
[im 351/370  mediastinal]
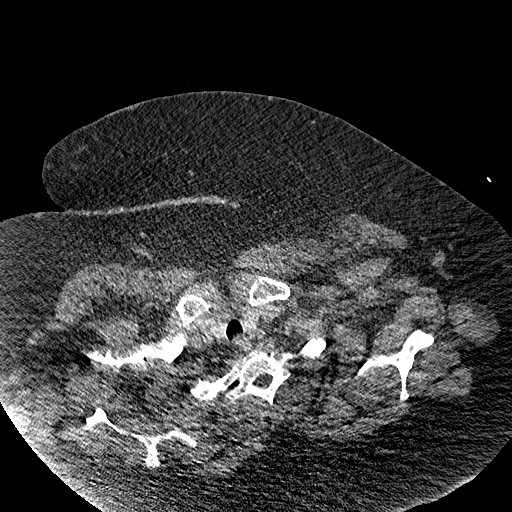

[Series 8: pe 2mm cor · coronal · 0.52mm/px · 1 of 151 slices shown]
[im 76/151  mediastinal]
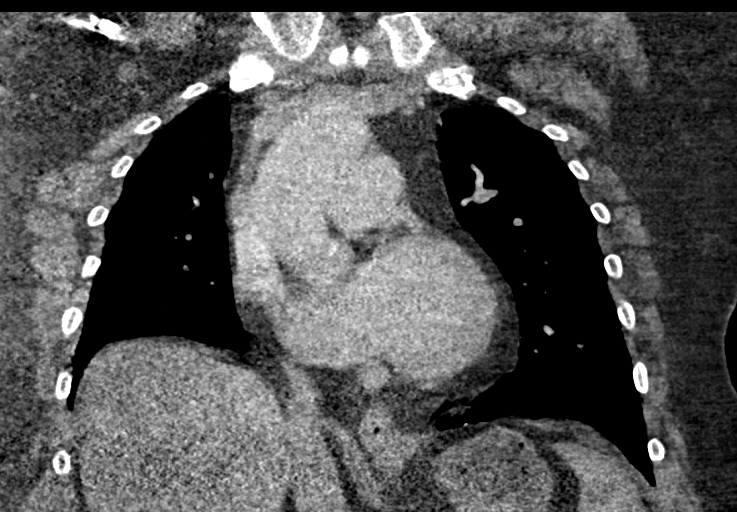

[19 of 36 positions shown; findings below may reference images not displayed]

FINDINGS: Cardiovascular: Limited opacification of the pulmonary arteries due
to body habitus and bolus dispersion. No pulmonary embolism is seen,
definitely nondiagnostic beyond the main and lobar level. Coronary
calcification. No cardiomegaly or pericardial effusion. Mild
atherosclerotic calcification.

Mediastinum/Nodes: Negative for adenopathy or mass.

Lungs/Pleura: Linear atelectasis/scarring. There is no edema,
consolidation, effusion, or pneumothorax. Symmetric narrowing of the
bilateral mainstem bronchi, likely bronchomalacia.

Upper Abdomen: No acute finding

Musculoskeletal: Spondylosis.  No acute or aggressive finding

Review of the MIP images confirms the above findings.
IMPRESSION: 1. Severely limited chest CTA due to body habitus and bolus
dispersion. No pulmonary embolism is seen at the main or lobar
levels.
2.  Aortic Atherosclerosis ([id number]).  Coronary atherosclerosis

## 2022-01-02 IMAGING — DX DG ABDOMEN 1V
2 series · 2 of 2 positions shown · non-contrast
Comparison: Abdominal radiographs CT abdomen/pelvis 11/28/2011.
12/27/2011

CLINICAL DATA: Abdominal pain with nausea and headaches.

EXAM:
ABDOMEN - 1 VIEW

[t abdomen supine (1 of 2)]
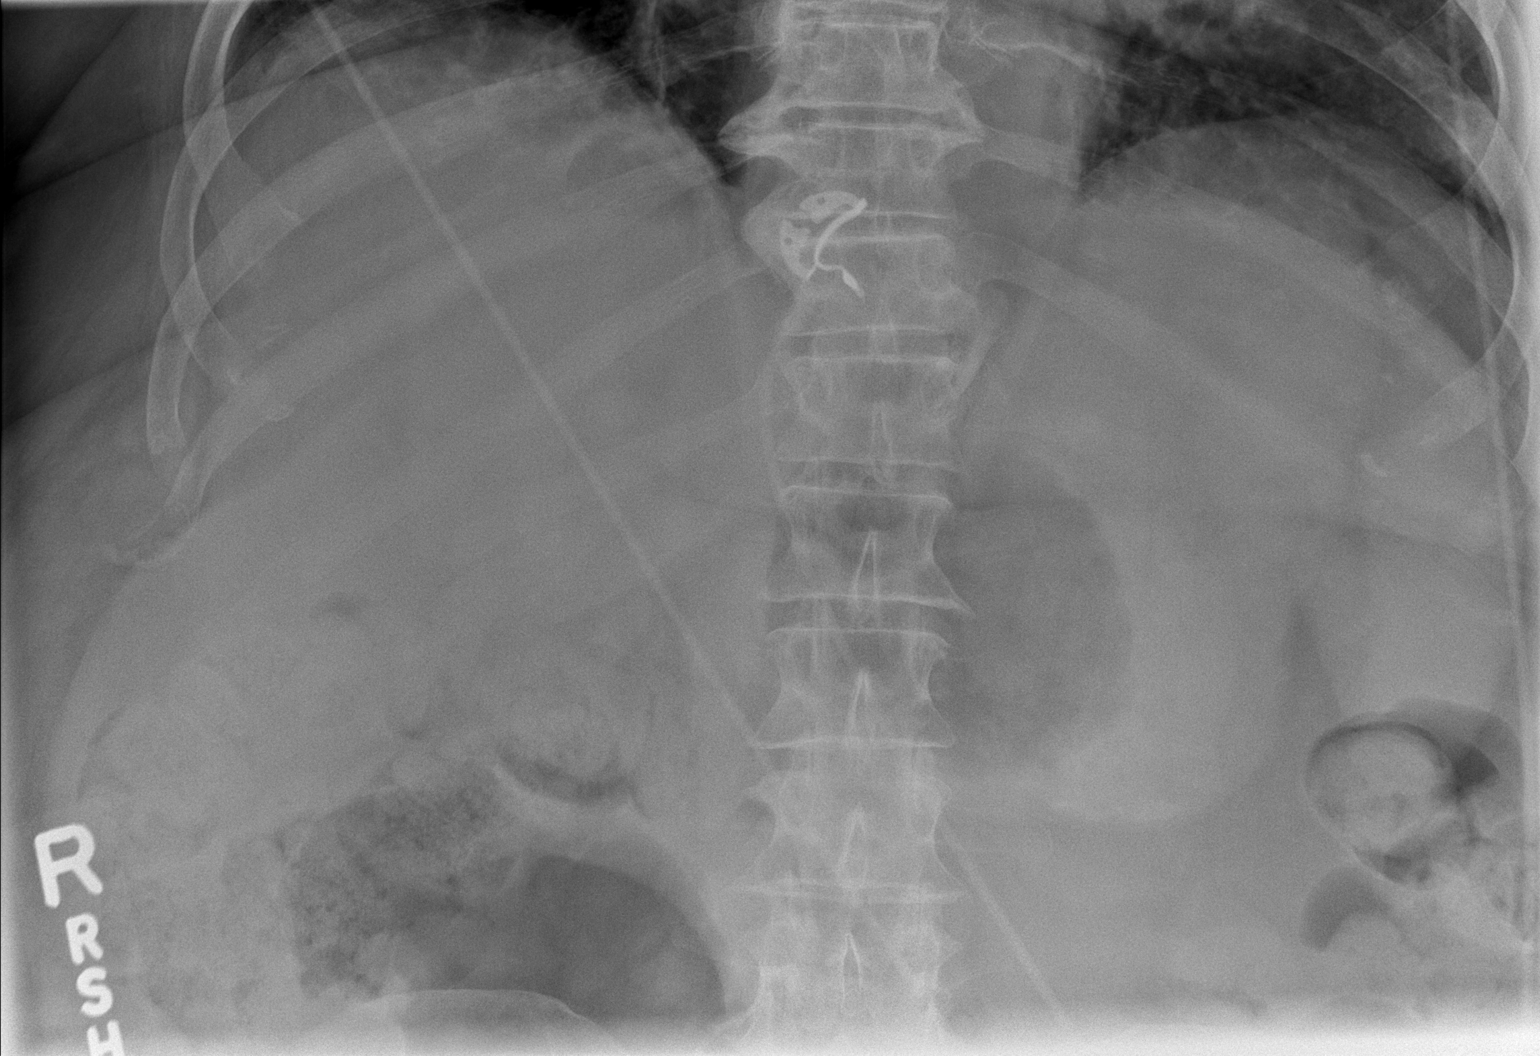

[t abdomen supine (2 of 2)]
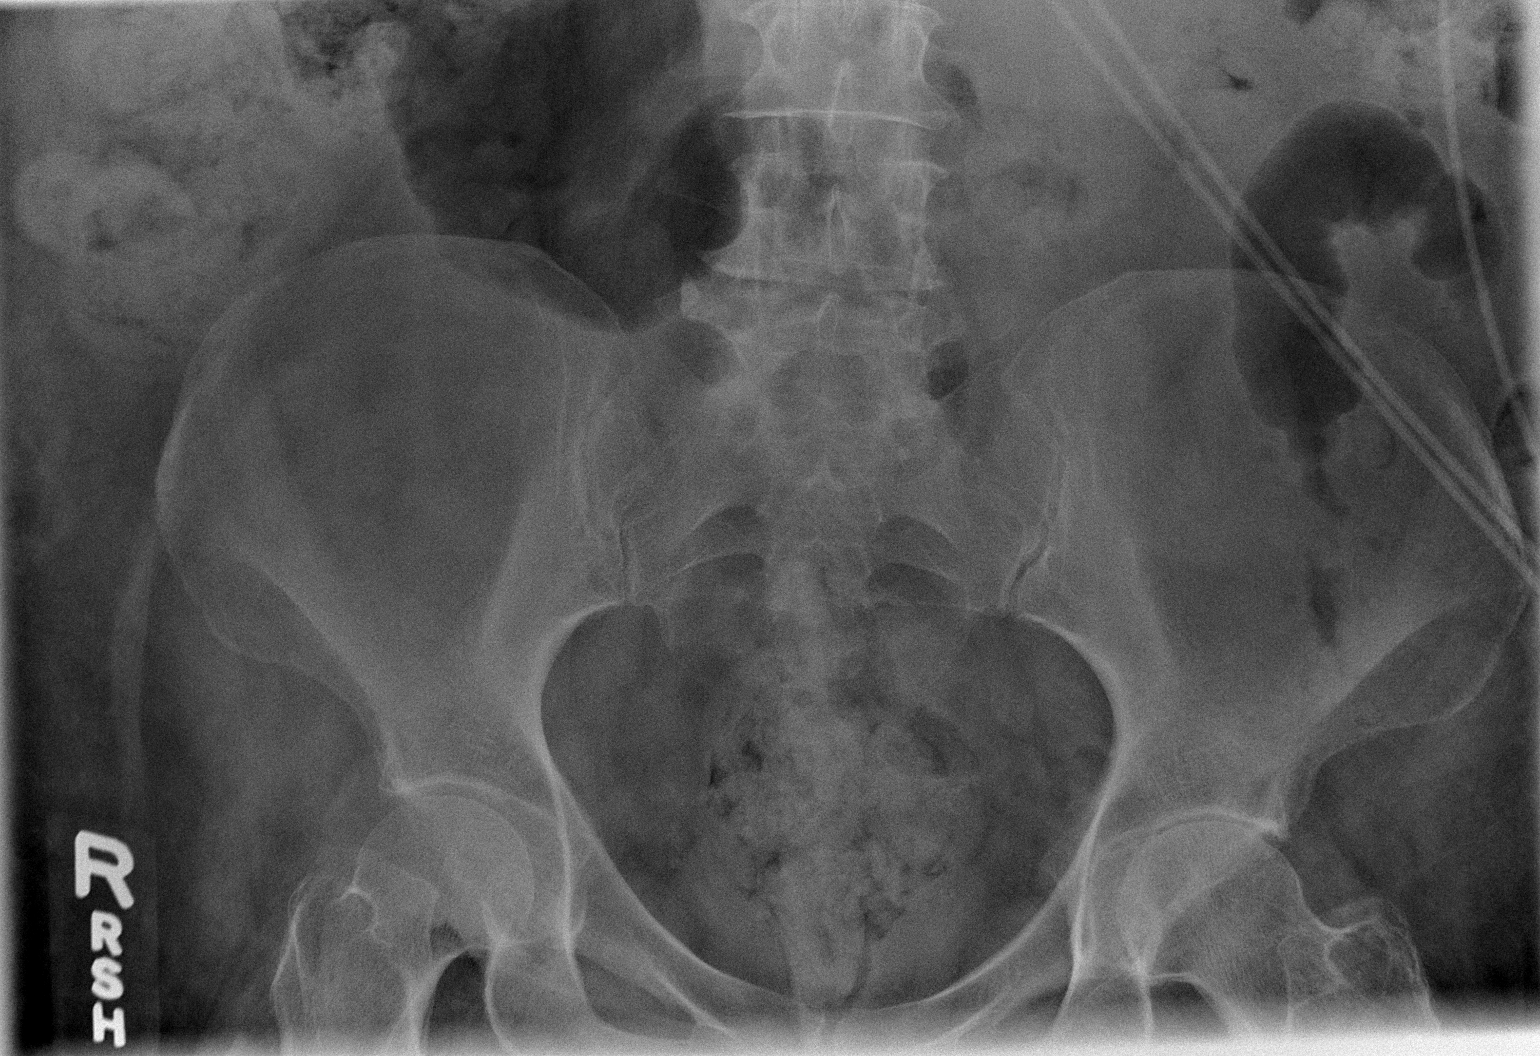

[2 of 2 positions shown; findings below may reference images not displayed]

FINDINGS: Air distention of a nondescript bowel loop within the right
hemiabdomen. Large colonic stool burden within the right colon and
distal colon/rectum. No acute bony abnormality. Thoracic
spondylosis.
IMPRESSION: Air distention of nondescript bowel loop within the right
hemiabdomen, nonspecific.

Large stool burden within the right colon and distal colon/rectum.

## 2022-01-04 IMAGING — DX DG CHEST 1V PORT
1 series · 2 of 2 positions shown · non-contrast
Comparison: June 13, 2019

CLINICAL DATA: Shortness of breath. Cellulitis of lower extremity

EXAM:
PORTABLE CHEST 1 VIEW

[Series 1: chest · 0.14mm/px · 2 of 2 slices shown]
[im 1/2]
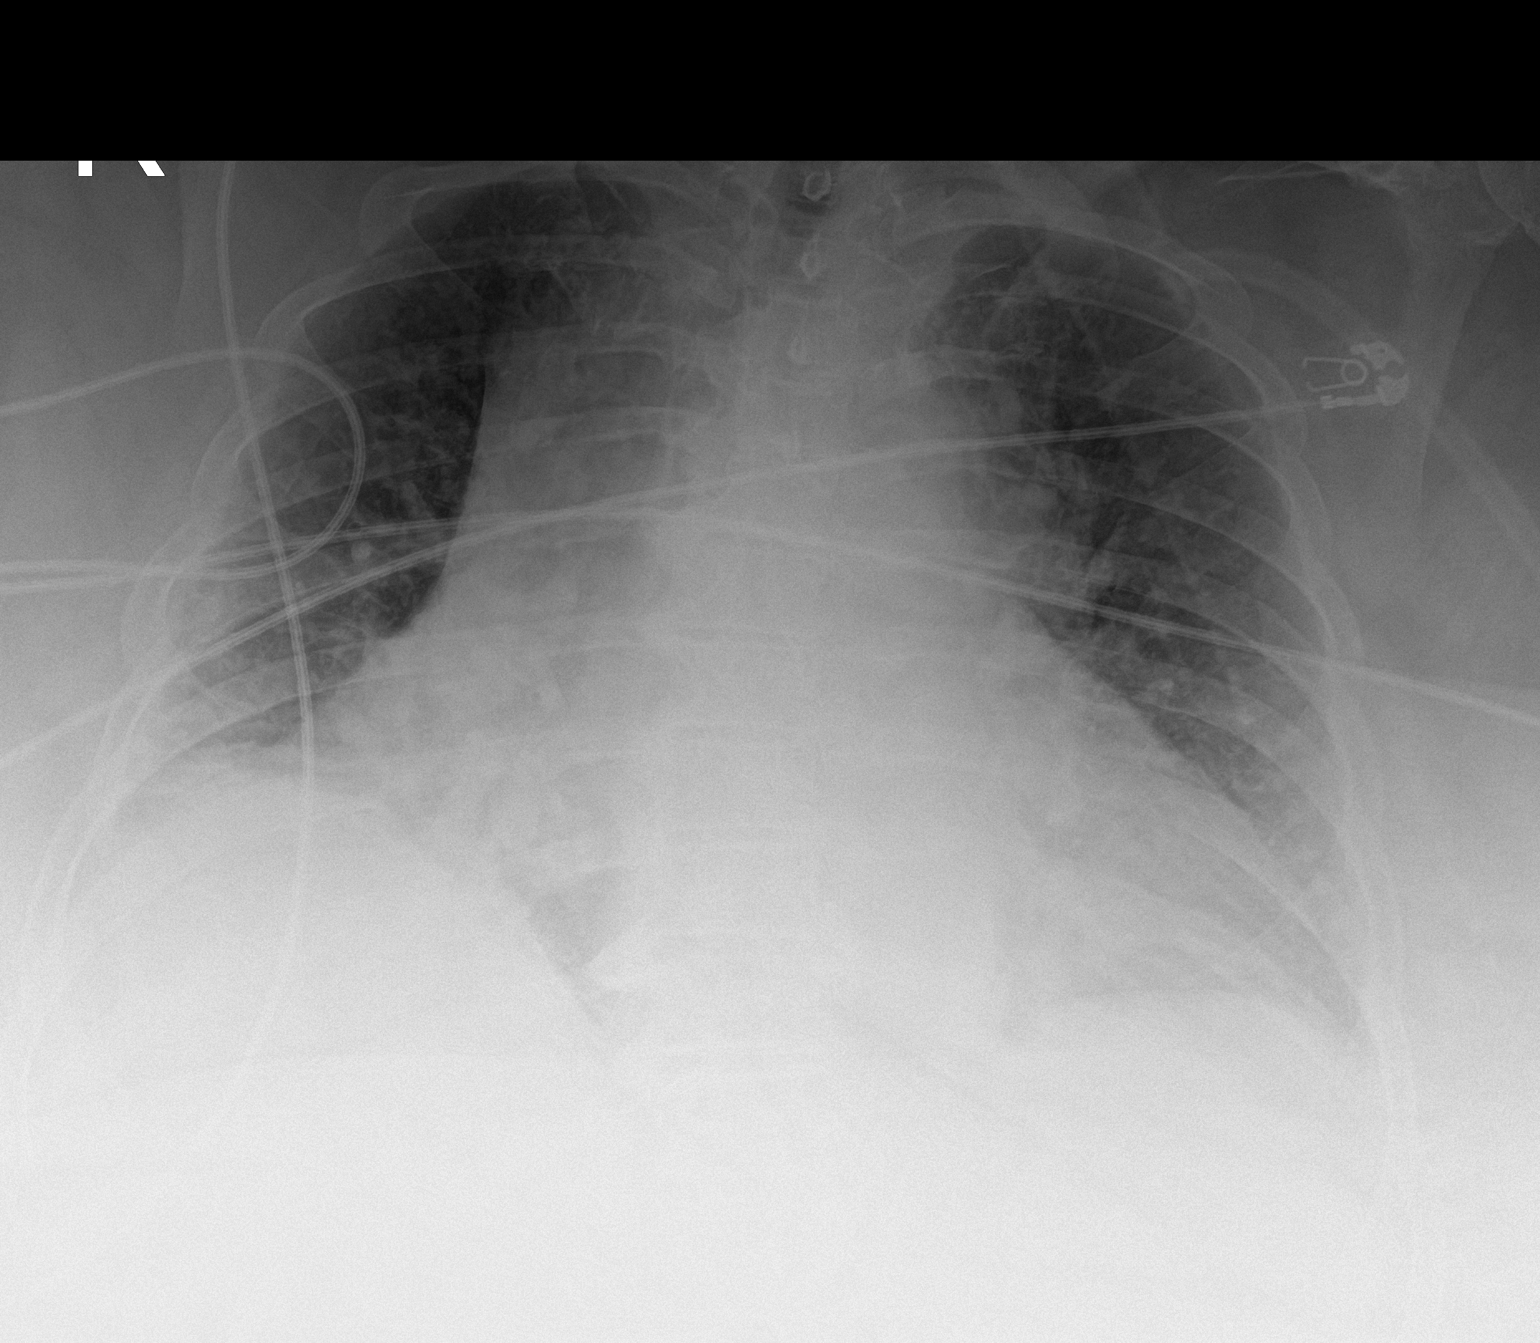
[im 2/2]
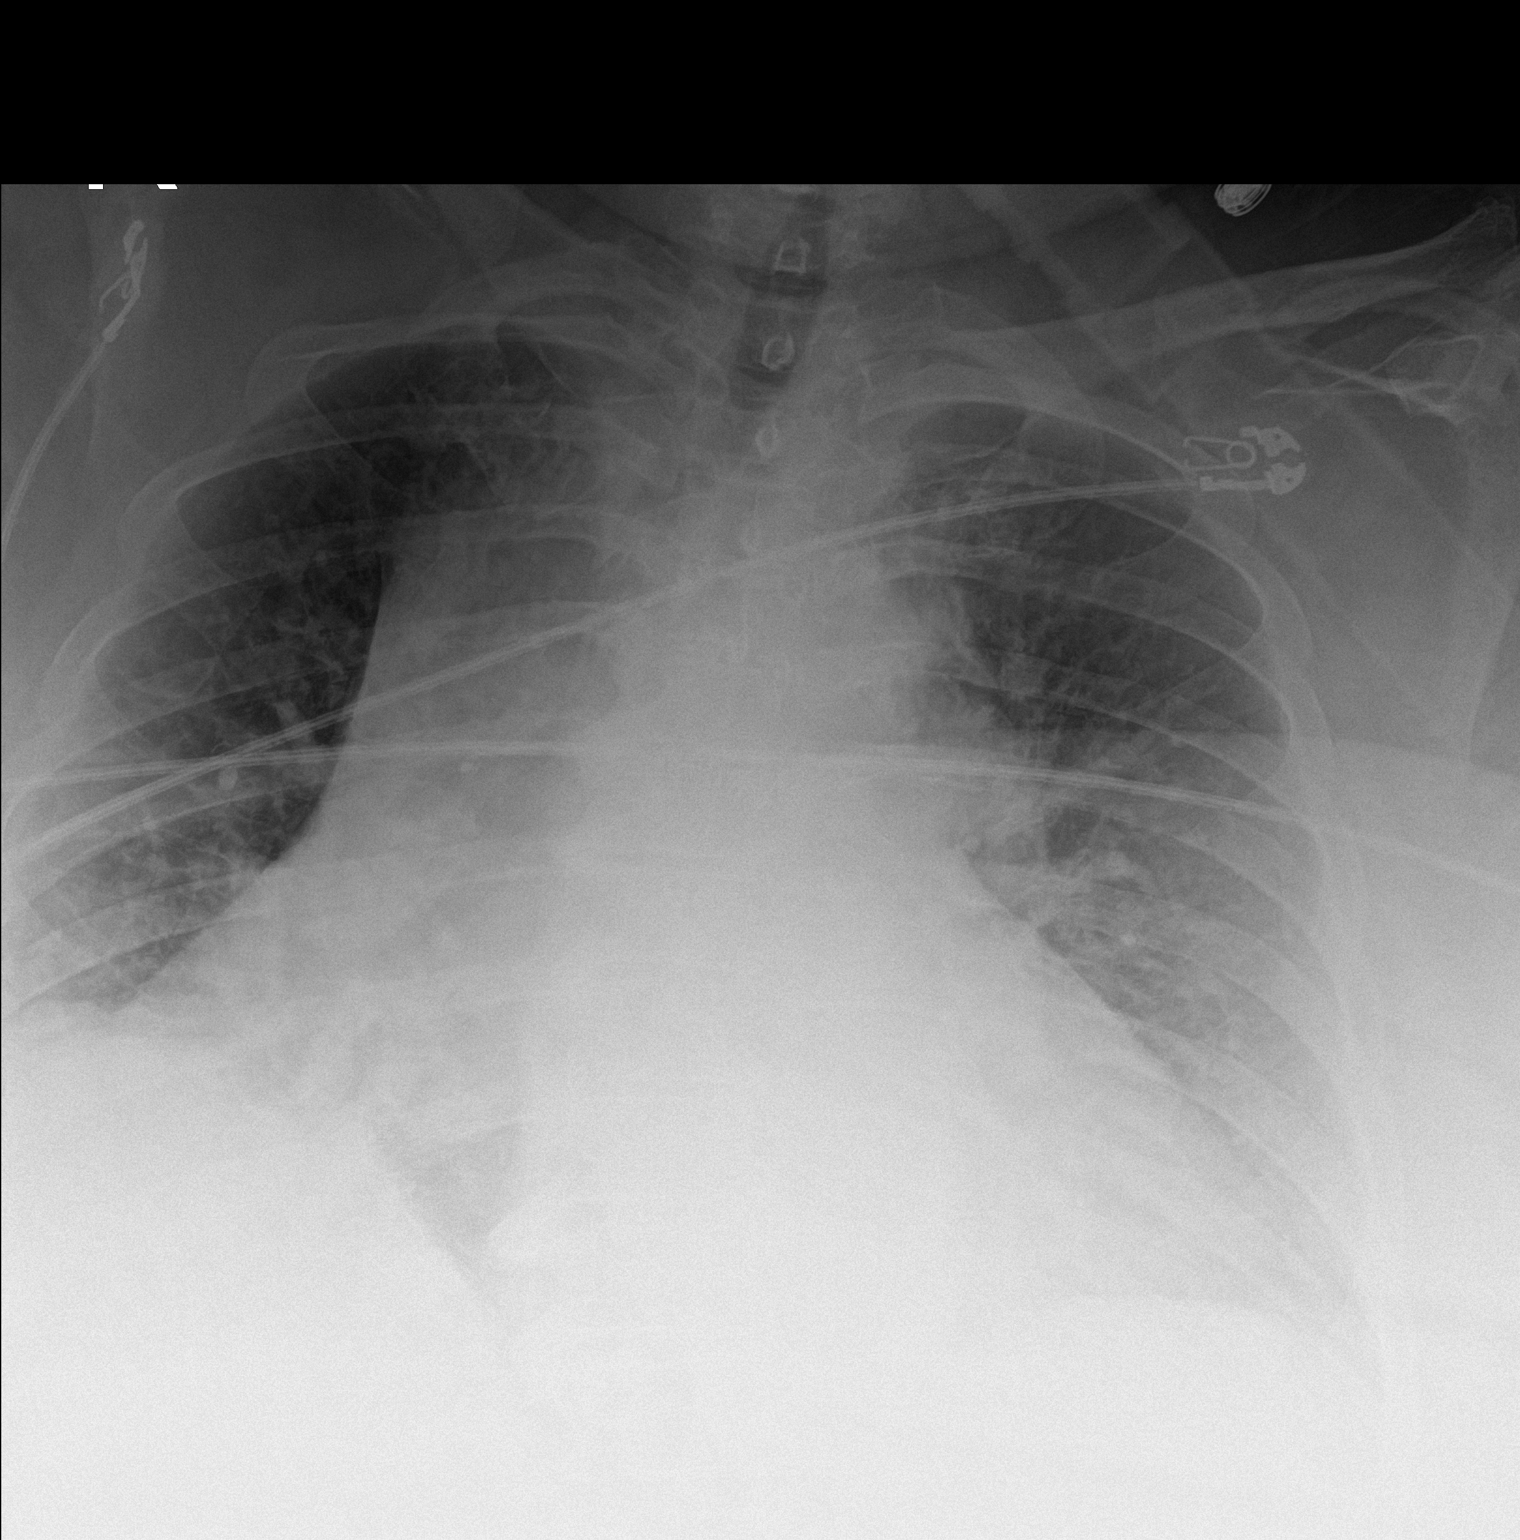

[2 of 2 positions shown; findings below may reference images not displayed]

FINDINGS: The mediastinal contour is stable. Heart size is enlarged. Increased
pulmonary interstitium is identified bilaterally. No focal pneumonia
or pleural effusion is identified. The bony structures are stable.
IMPRESSION: Congestive heart failure.

## 2022-01-05 IMAGING — DX DG CHEST 1V PORT
1 series · 1 of 1 positions shown · non-contrast
Comparison: 06/18/2019

CLINICAL DATA: Shortness of breath

EXAM:
PORTABLE CHEST 1 VIEW

[chest ap]
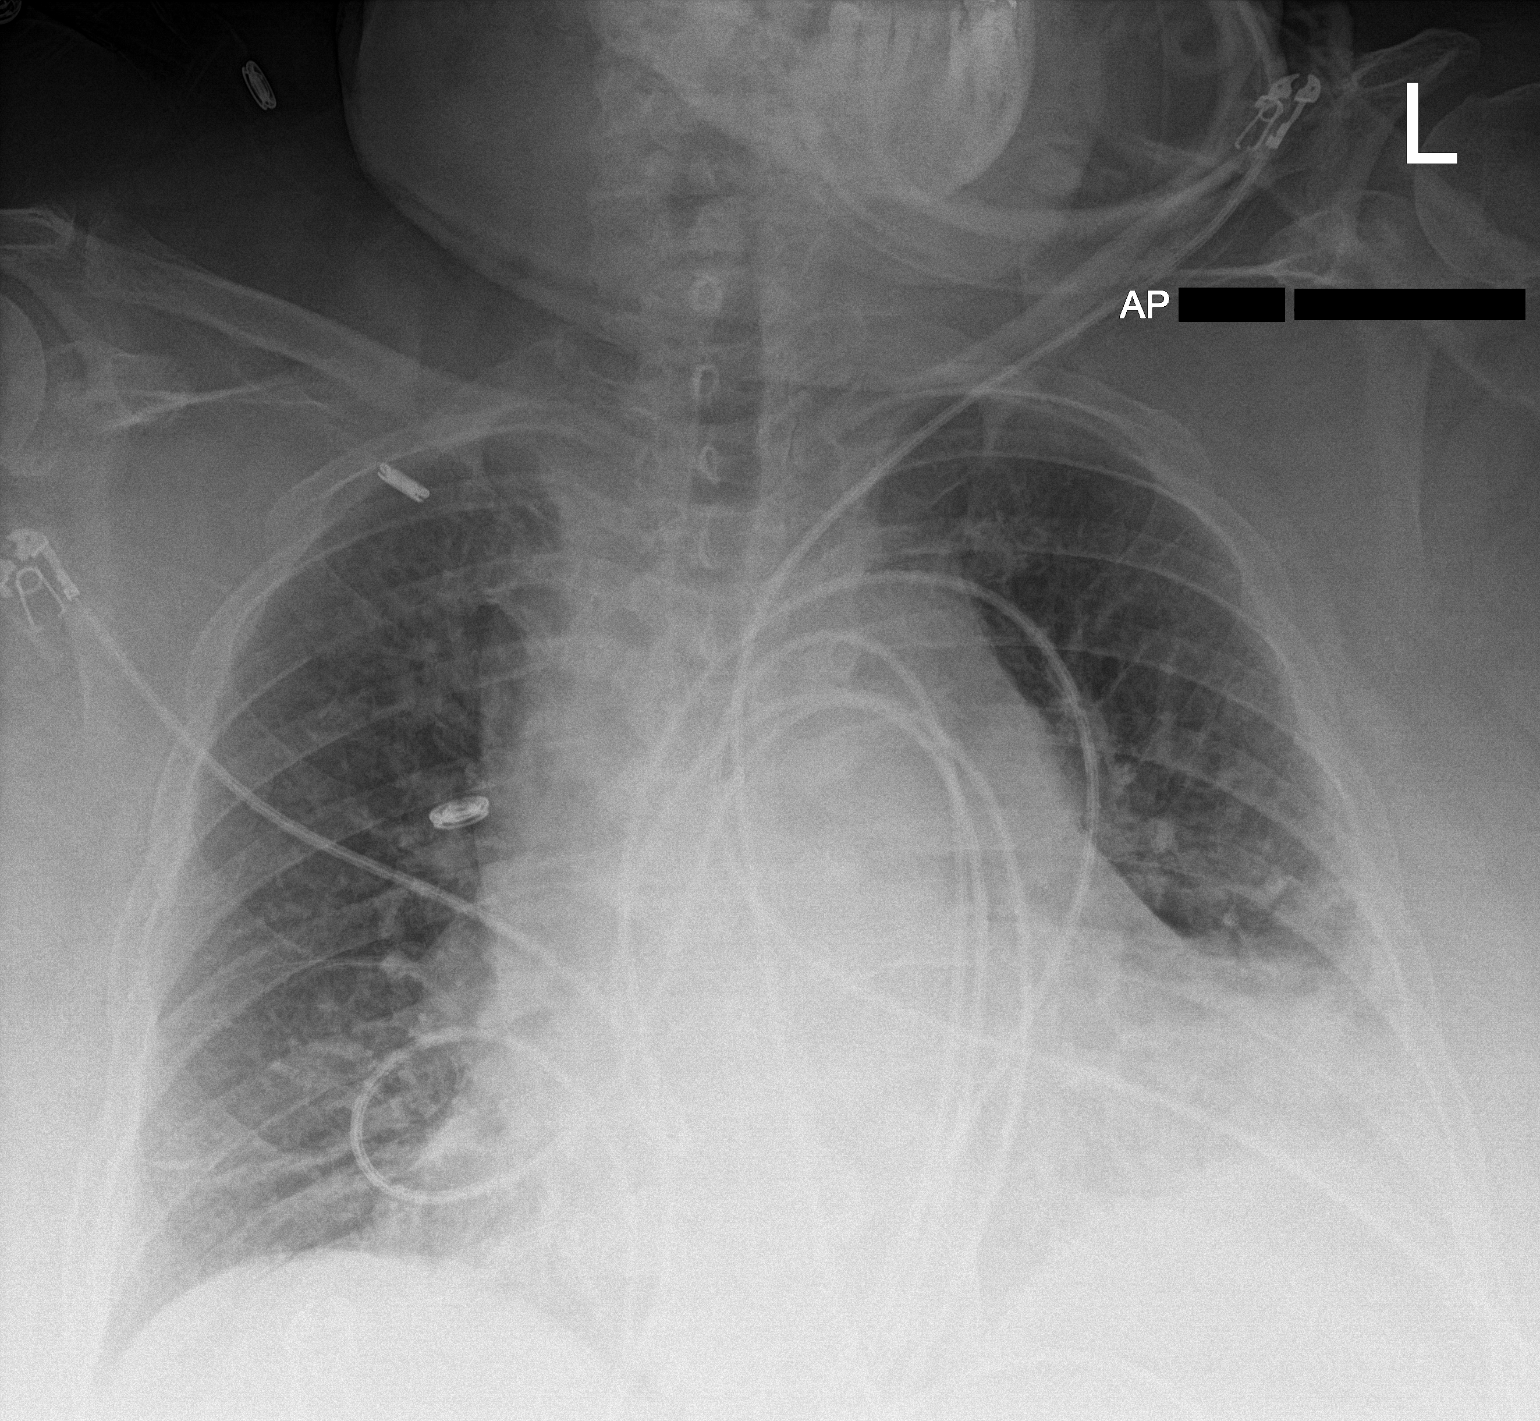

[1 of 1 positions shown; findings below may reference images not displayed]

FINDINGS: Bilateral interstitial prominence. Lingular airspace disease which
may reflect atelectasis versus pneumonia. No pleural effusion or
pneumothorax. Stable cardiomediastinal silhouette. No aggressive
osseous lesion.
IMPRESSION: Lingular airspace disease which may reflect atelectasis versus
pneumonia.

Cardiomegaly with pulmonary vascular congestion.

## 2022-01-06 IMAGING — DX DG CHEST 1V PORT
1 series · 1 of 1 positions shown · non-contrast
Comparison: Yesterday

CLINICAL DATA: Shortness of breath

EXAM:
PORTABLE CHEST 1 VIEW

[chest]
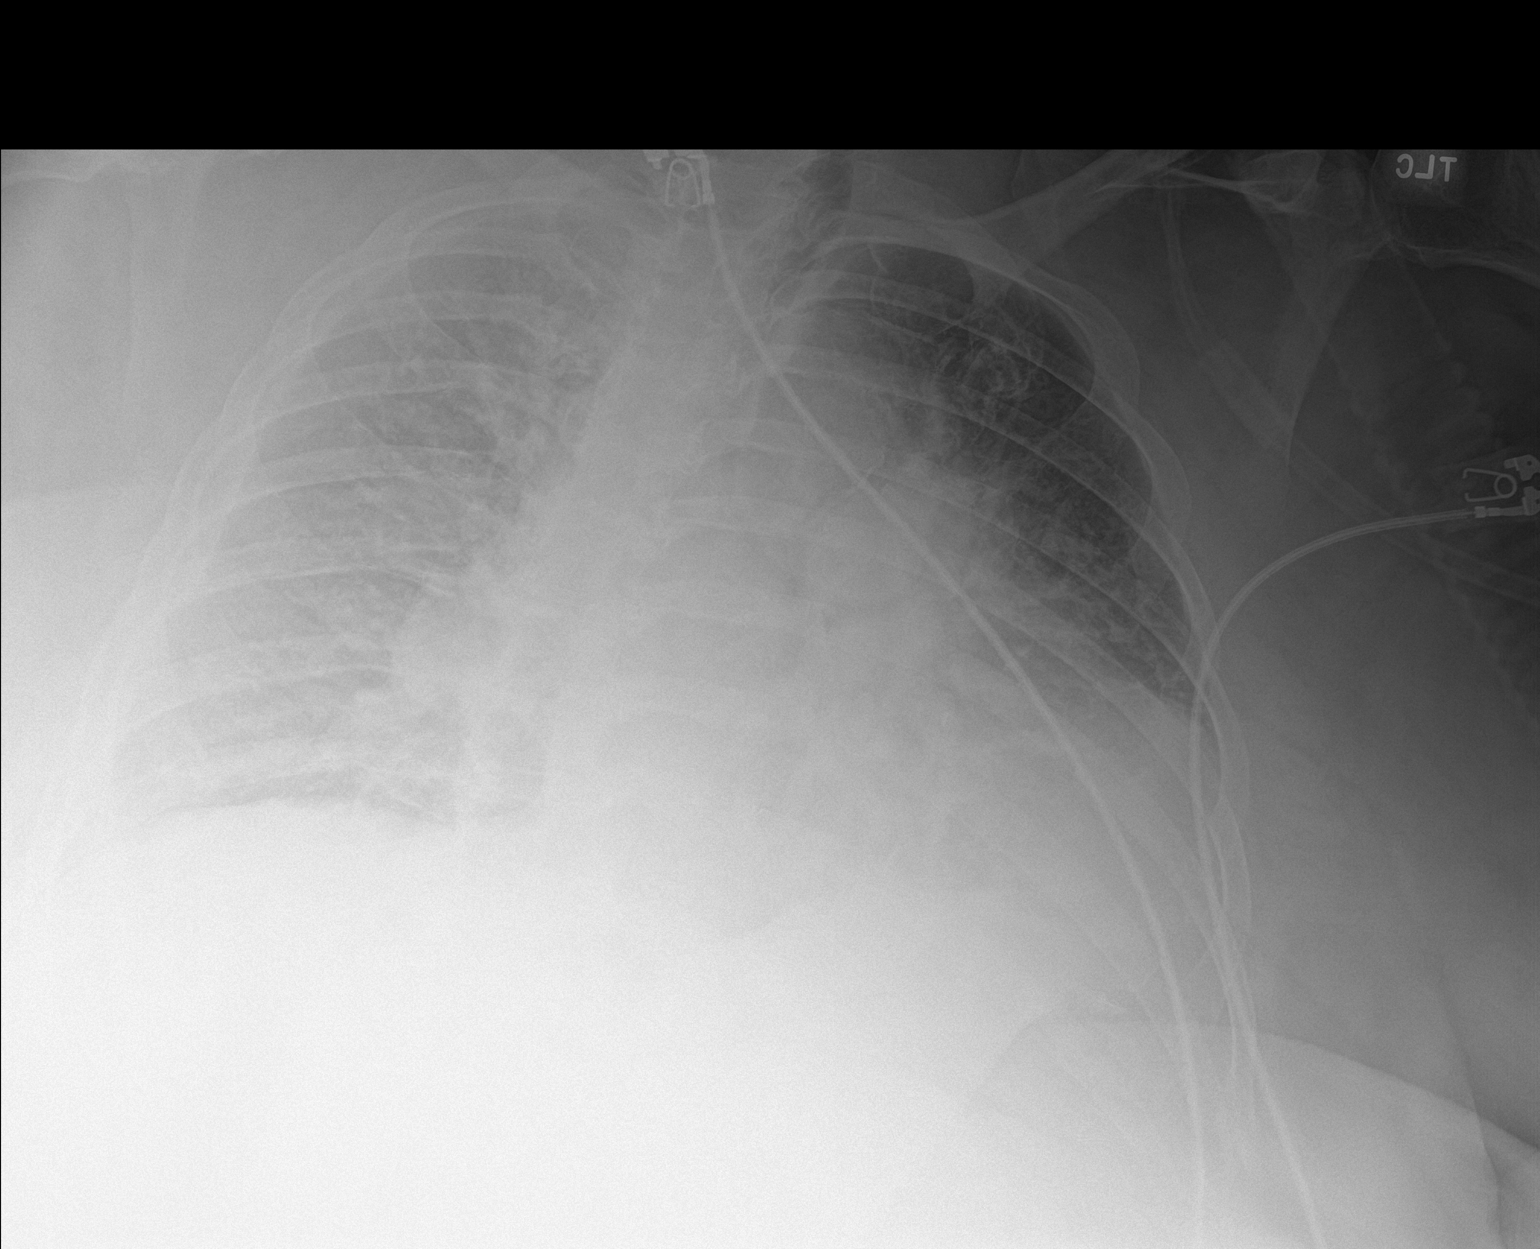

[1 of 1 positions shown; findings below may reference images not displayed]

FINDINGS: Cardiomegaly and vascular pedicle widening accentuated by rotation.
Low volume chest with diffuse vascular congestion. Interstitial
prominence without Kerley lines. No definite effusion.
IMPRESSION: CHF pattern.  Vascular congestion appears increased from yesterday.

## 2022-01-07 IMAGING — DX DG CHEST 1V PORT
1 series · 1 of 1 positions shown · non-contrast
Comparison: June 20, 2019

CLINICAL DATA: Shortness of breath

EXAM:
PORTABLE CHEST 1 VIEW

[chest ap]
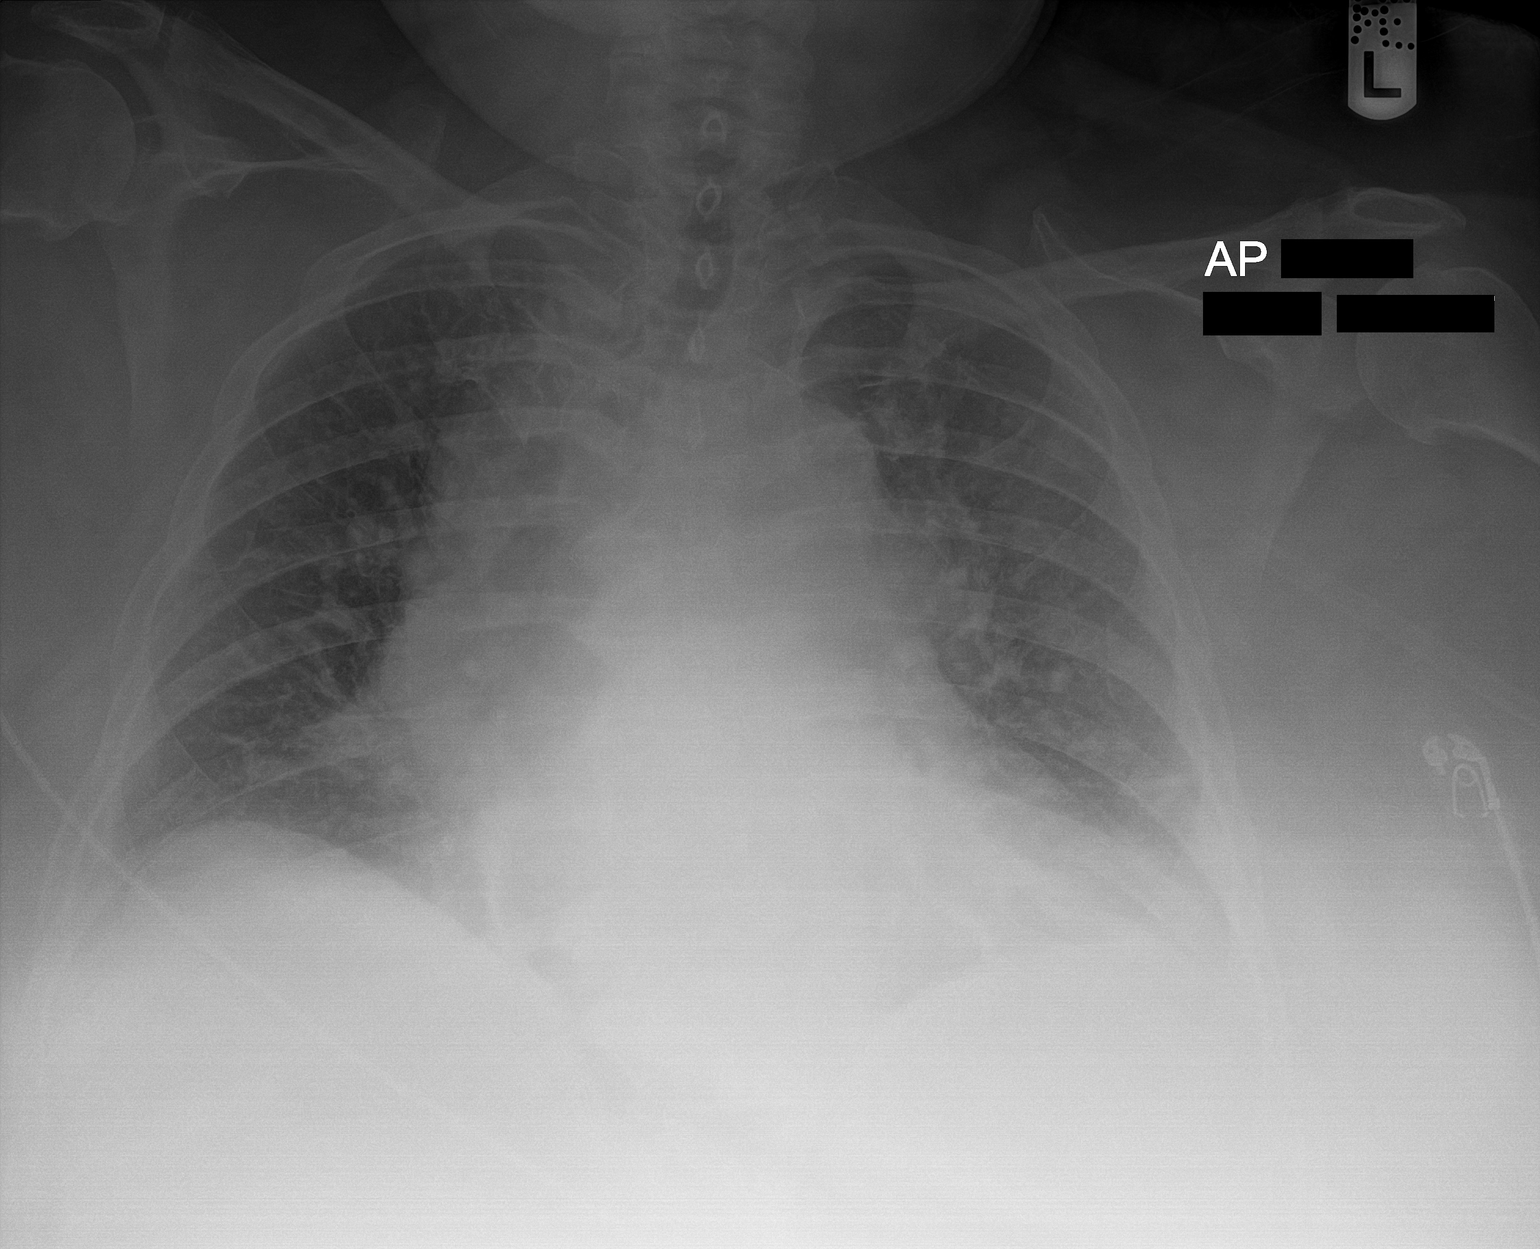

[1 of 1 positions shown; findings below may reference images not displayed]

FINDINGS: Mild bibasilar atelectasis. No edema or consolidation. There is
cardiomegaly with a degree of pulmonary vascular congestion.
IMPRESSION: No active disease.

## 2022-01-11 IMAGING — DX DG CHEST 1V PORT
1 series · 2 of 2 positions shown · non-contrast
Comparison: 06/21/2019

CLINICAL DATA: Wheezing and cough

EXAM:
PORTABLE CHEST 1 VIEW

[Series 1: chest ap · 0.14mm/px · 2 of 2 slices shown]
[im 1/2]
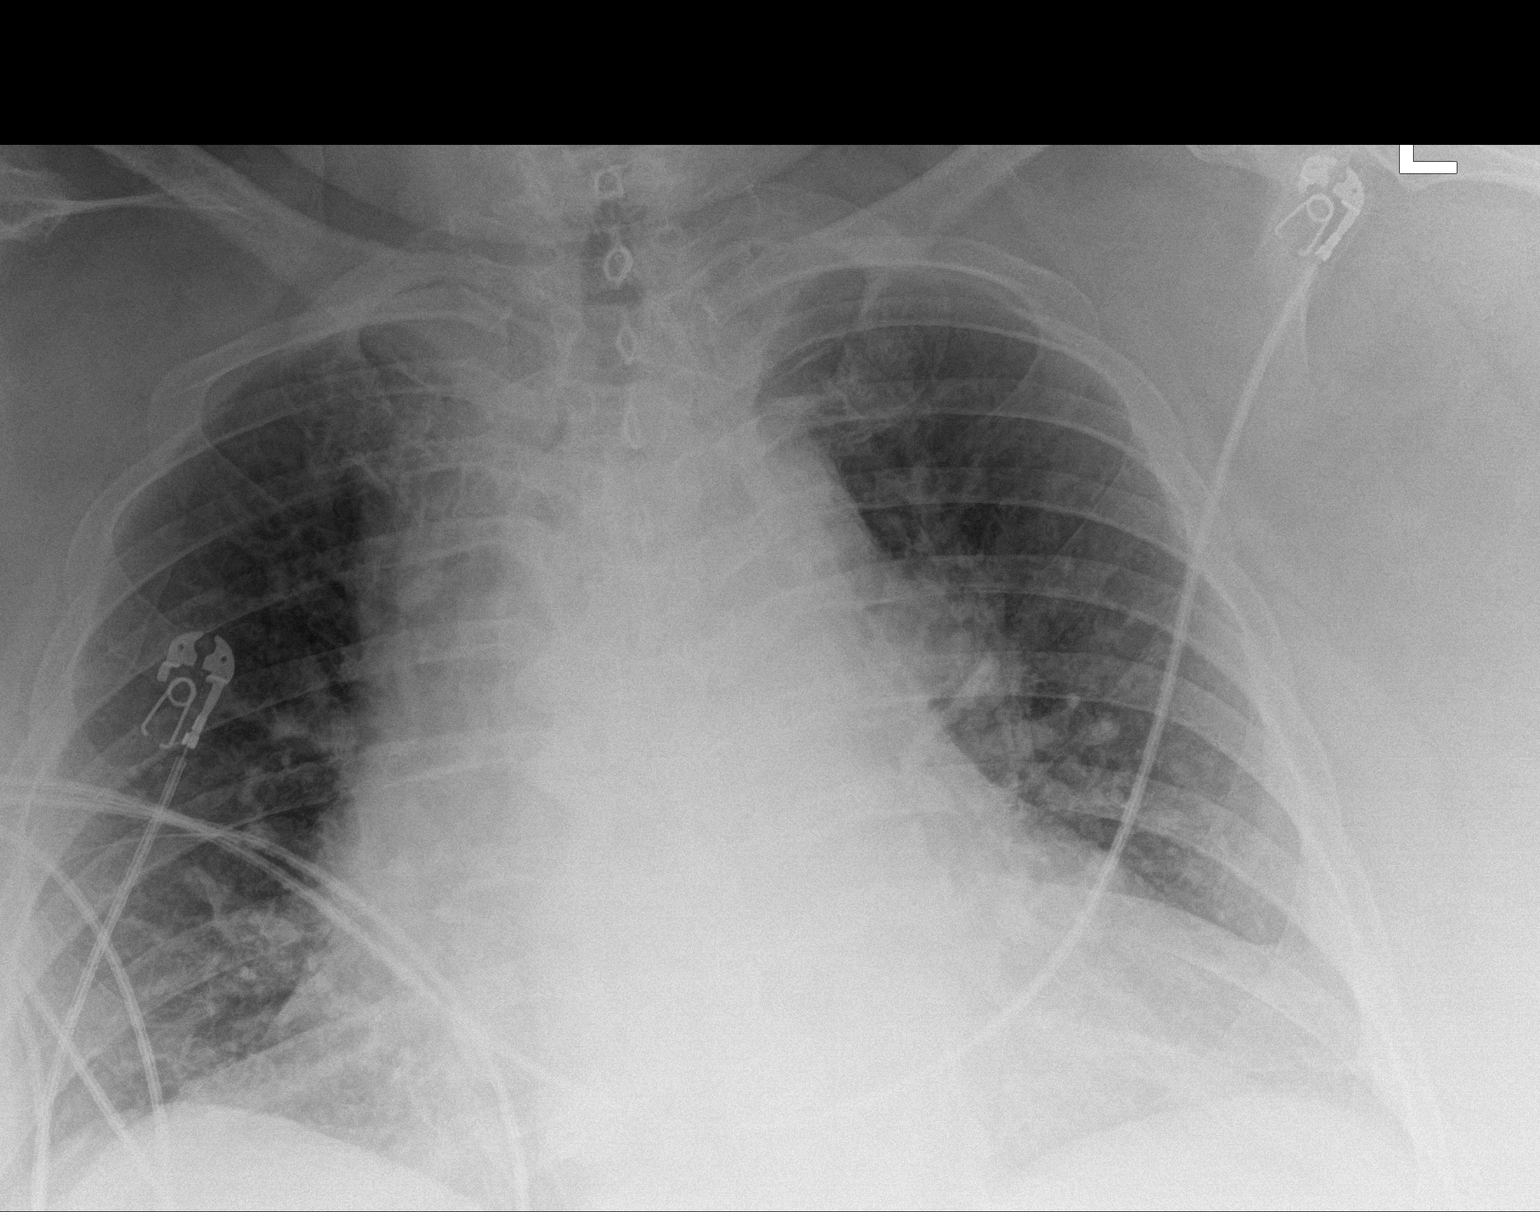
[im 2/2]
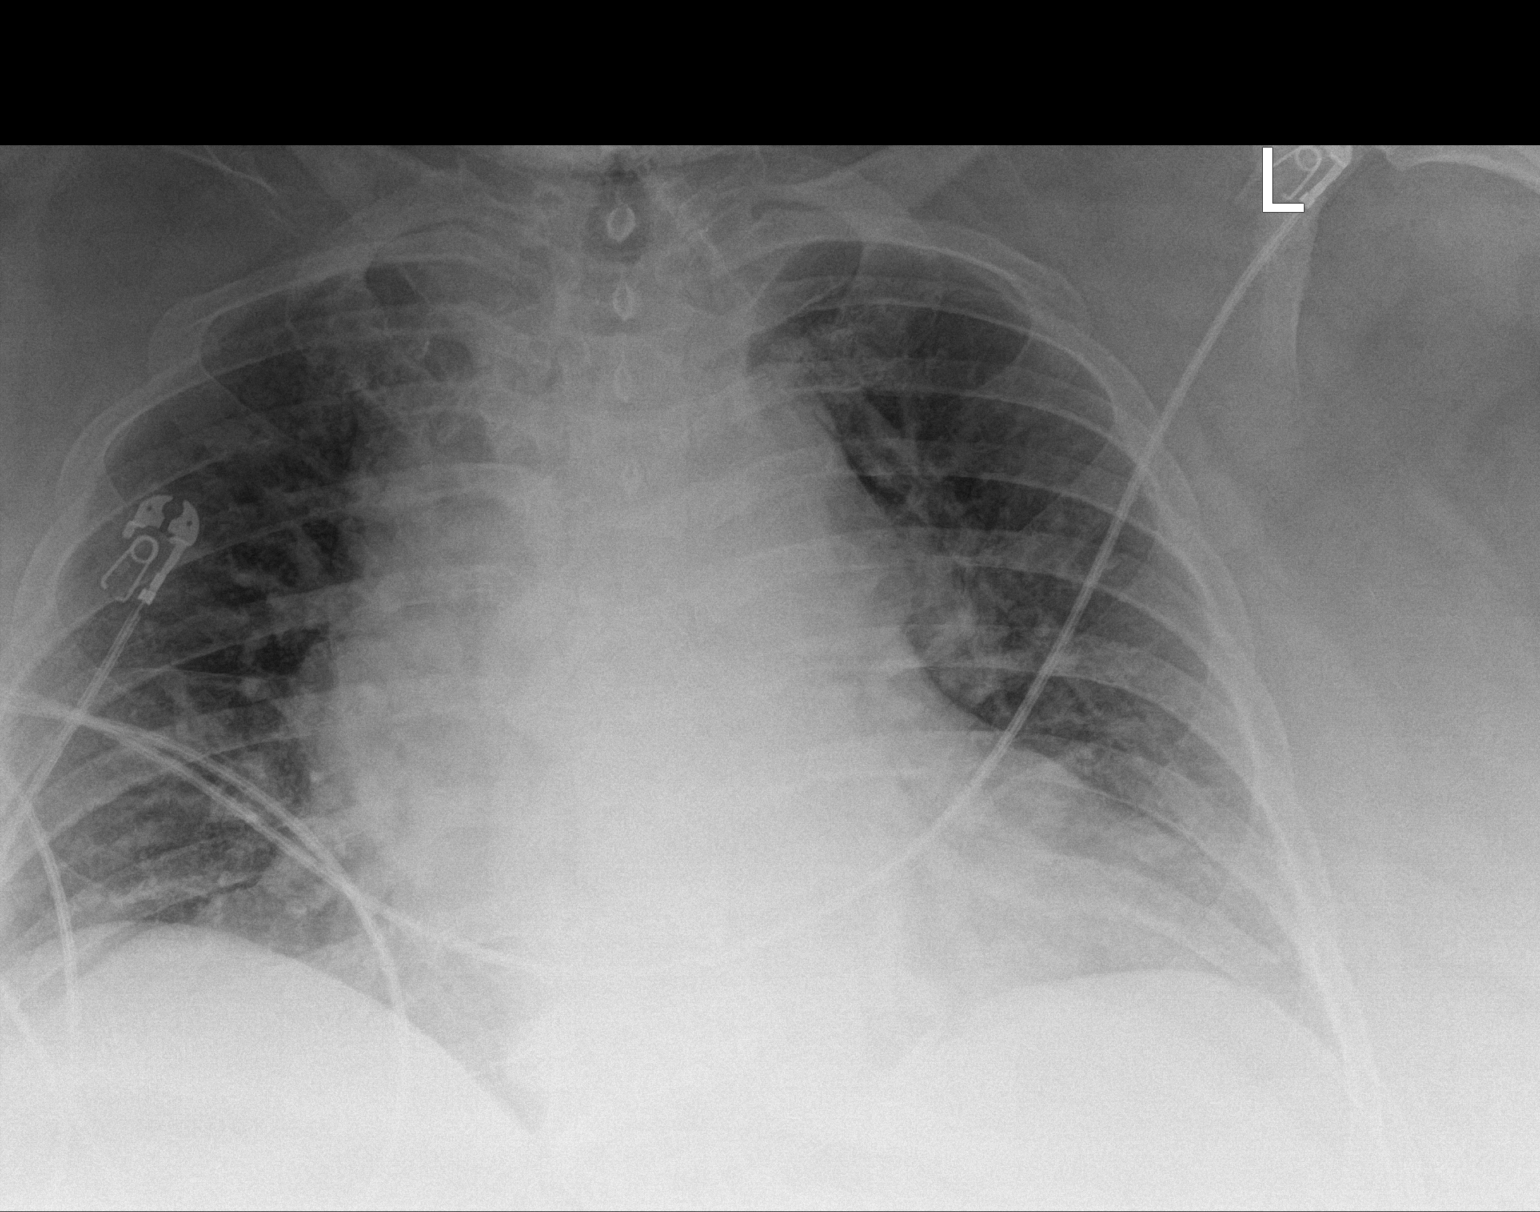

[2 of 2 positions shown; findings below may reference images not displayed]

FINDINGS: Cardiomegaly and vascular pedicle widening with vascular congestion.
No Kerley lines, effusion, or pneumothorax. There is artifact from
EKG leads.
IMPRESSION: Cardiomegaly and vascular congestion.

## 2022-02-25 IMAGING — CR DG CHEST 2V
2 series · 2 of 2 positions shown · non-contrast
Comparison: 06/25/2019

CLINICAL DATA: Chest pain

EXAM:
CHEST - 2 VIEW

[chest lat]
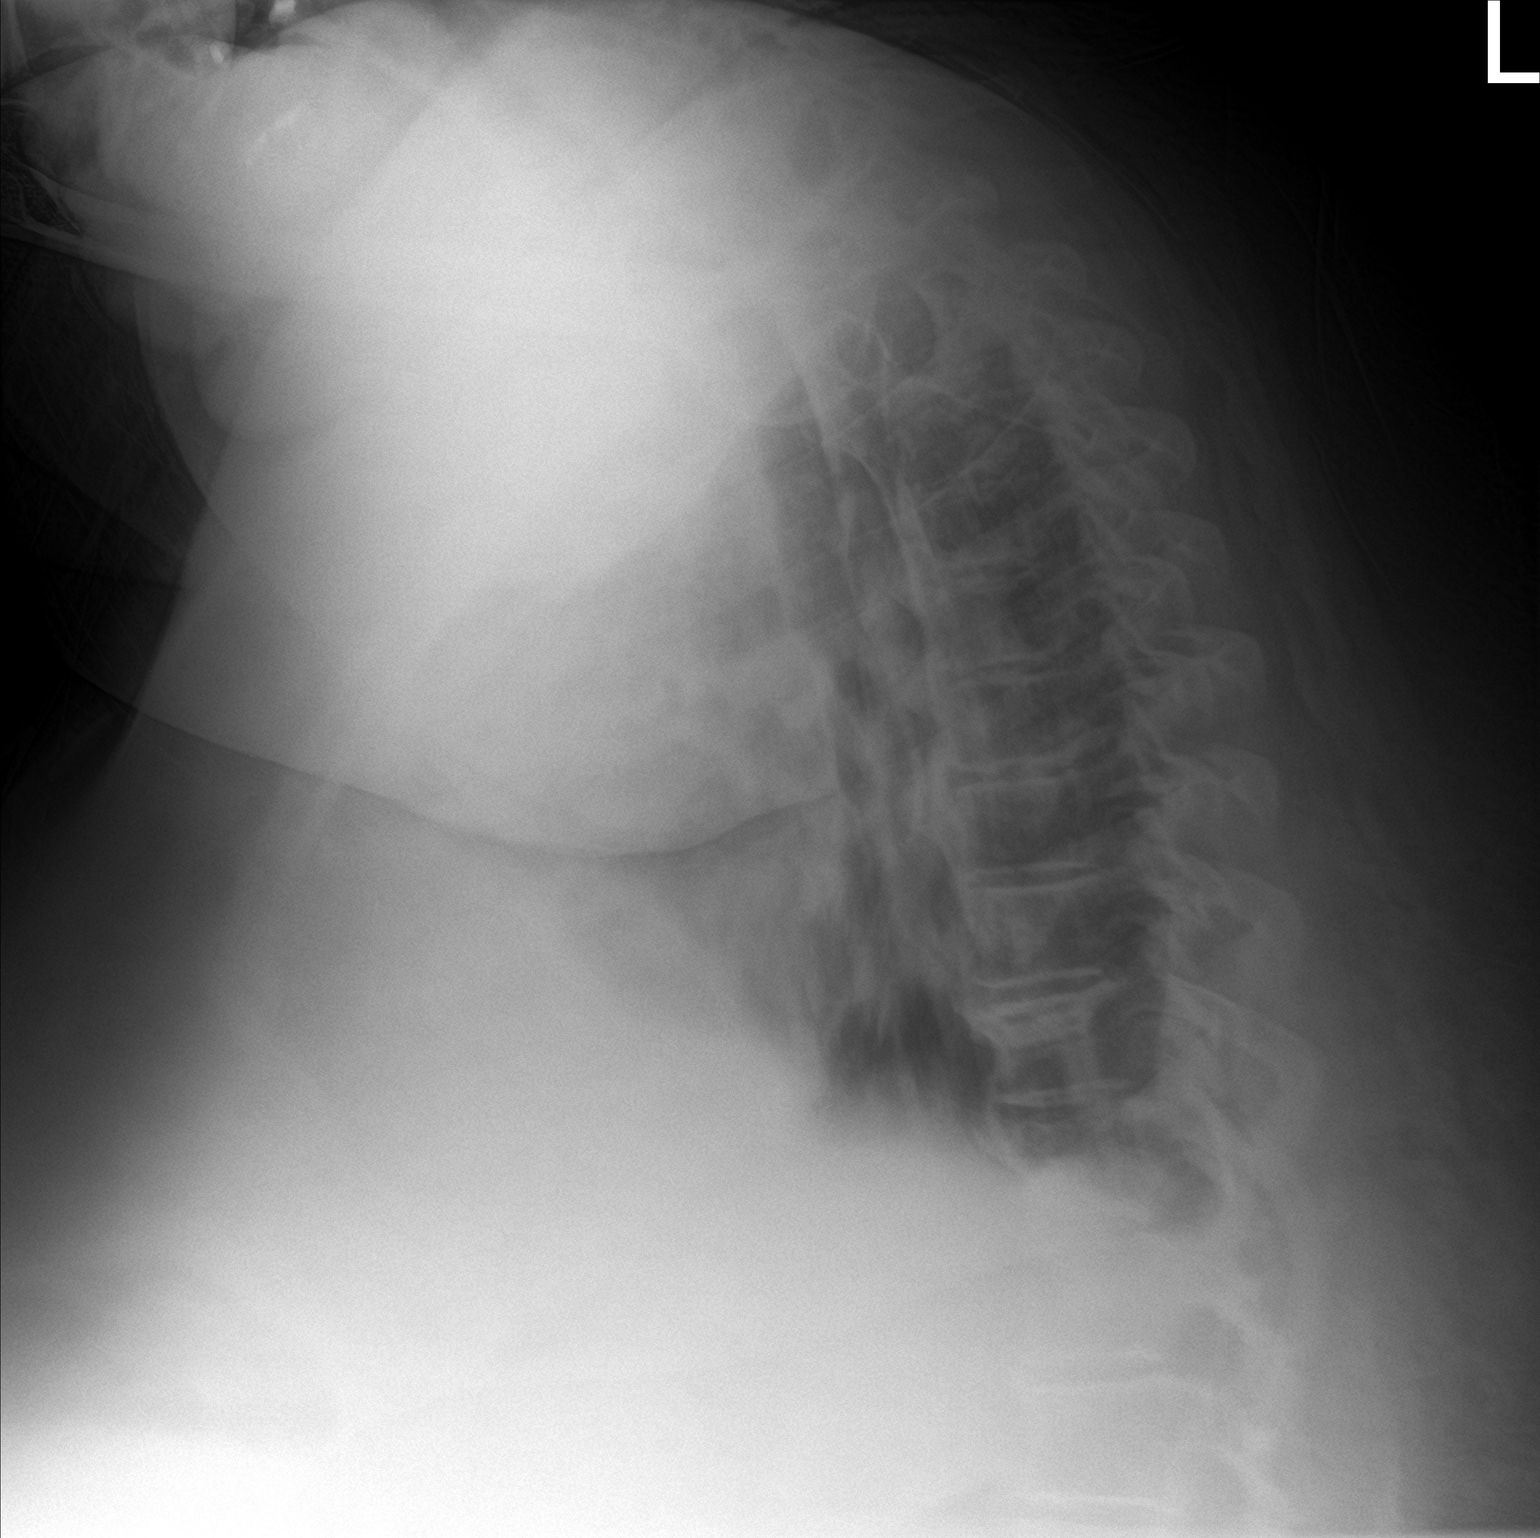

[chest ap]
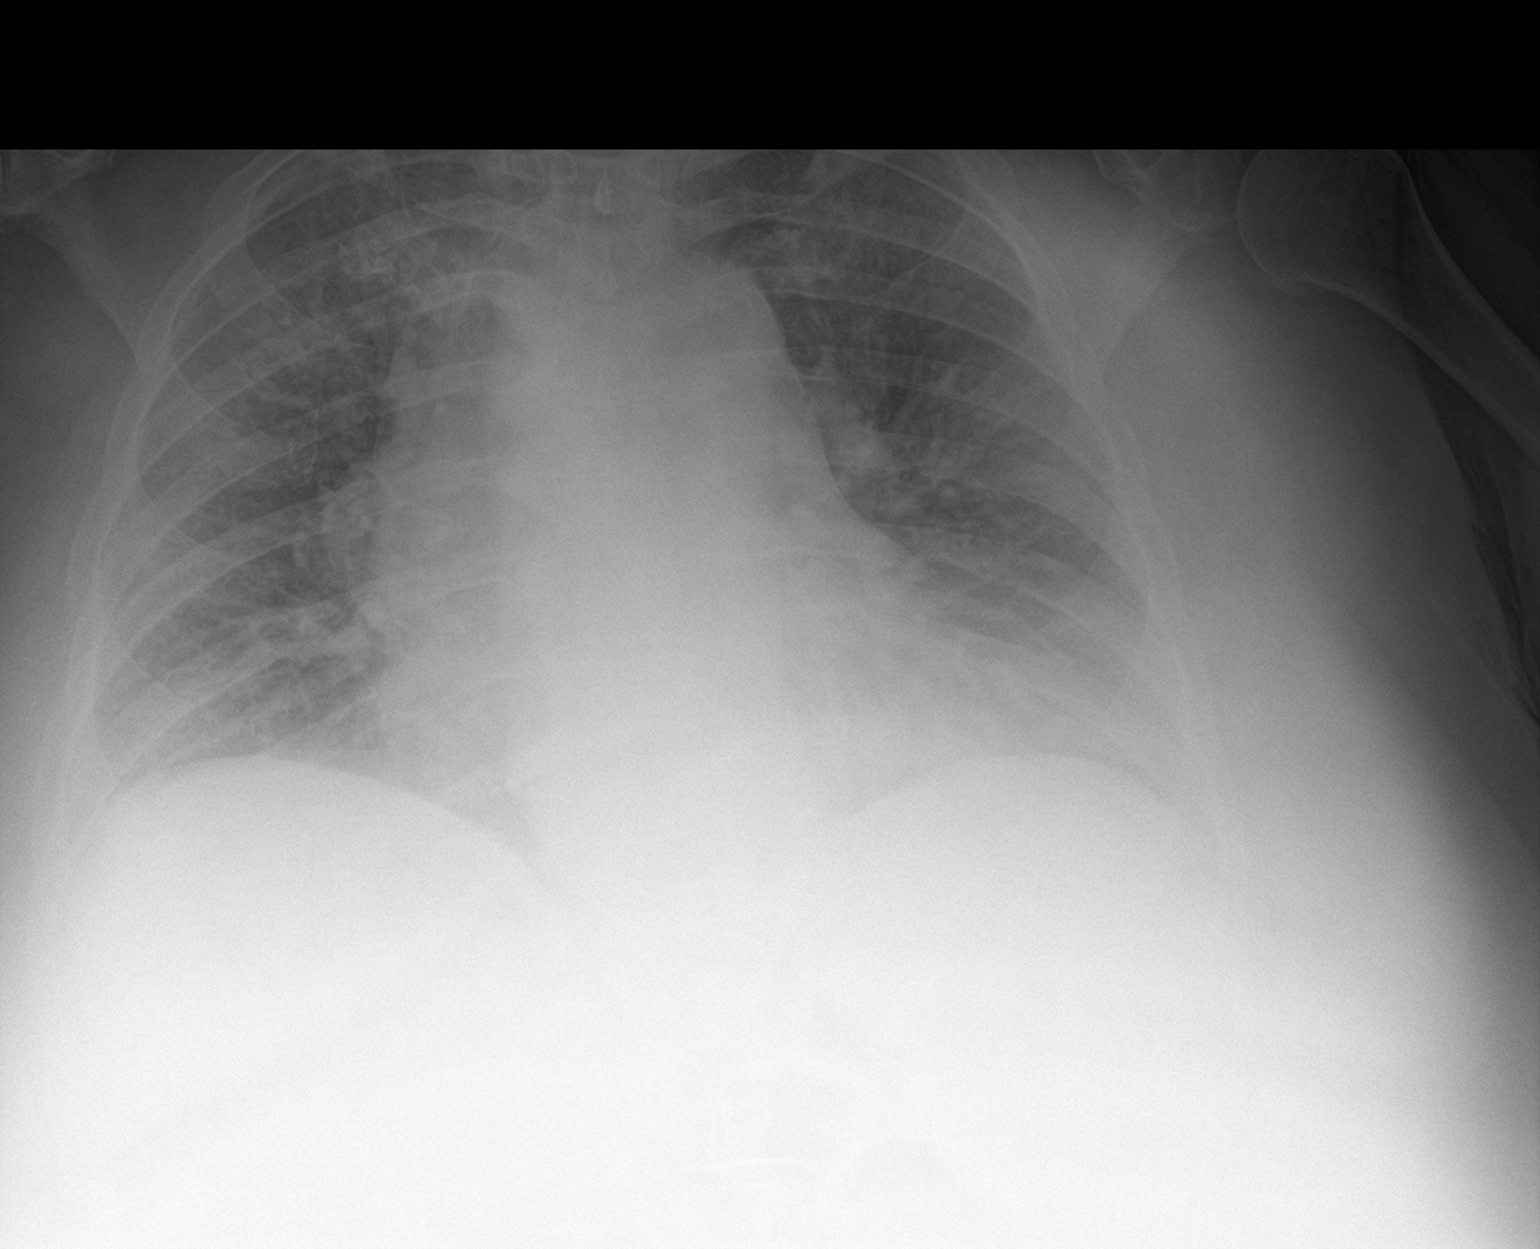

[2 of 2 positions shown; findings below may reference images not displayed]

FINDINGS: Cardiomegaly with vascular congestion. Interstitial prominence may
reflect early interstitial edema. No effusions. No acute bony
abnormality.
IMPRESSION: Cardiomegaly with vascular congestion and possible early
interstitial edema.

## 2022-02-28 IMAGING — DX DG CHEST 1V
1 series · 1 of 1 positions shown · non-contrast
Comparison: August 09, 2019

CLINICAL DATA: Hypoxia

EXAM:
CHEST  1 VIEW

[chest]
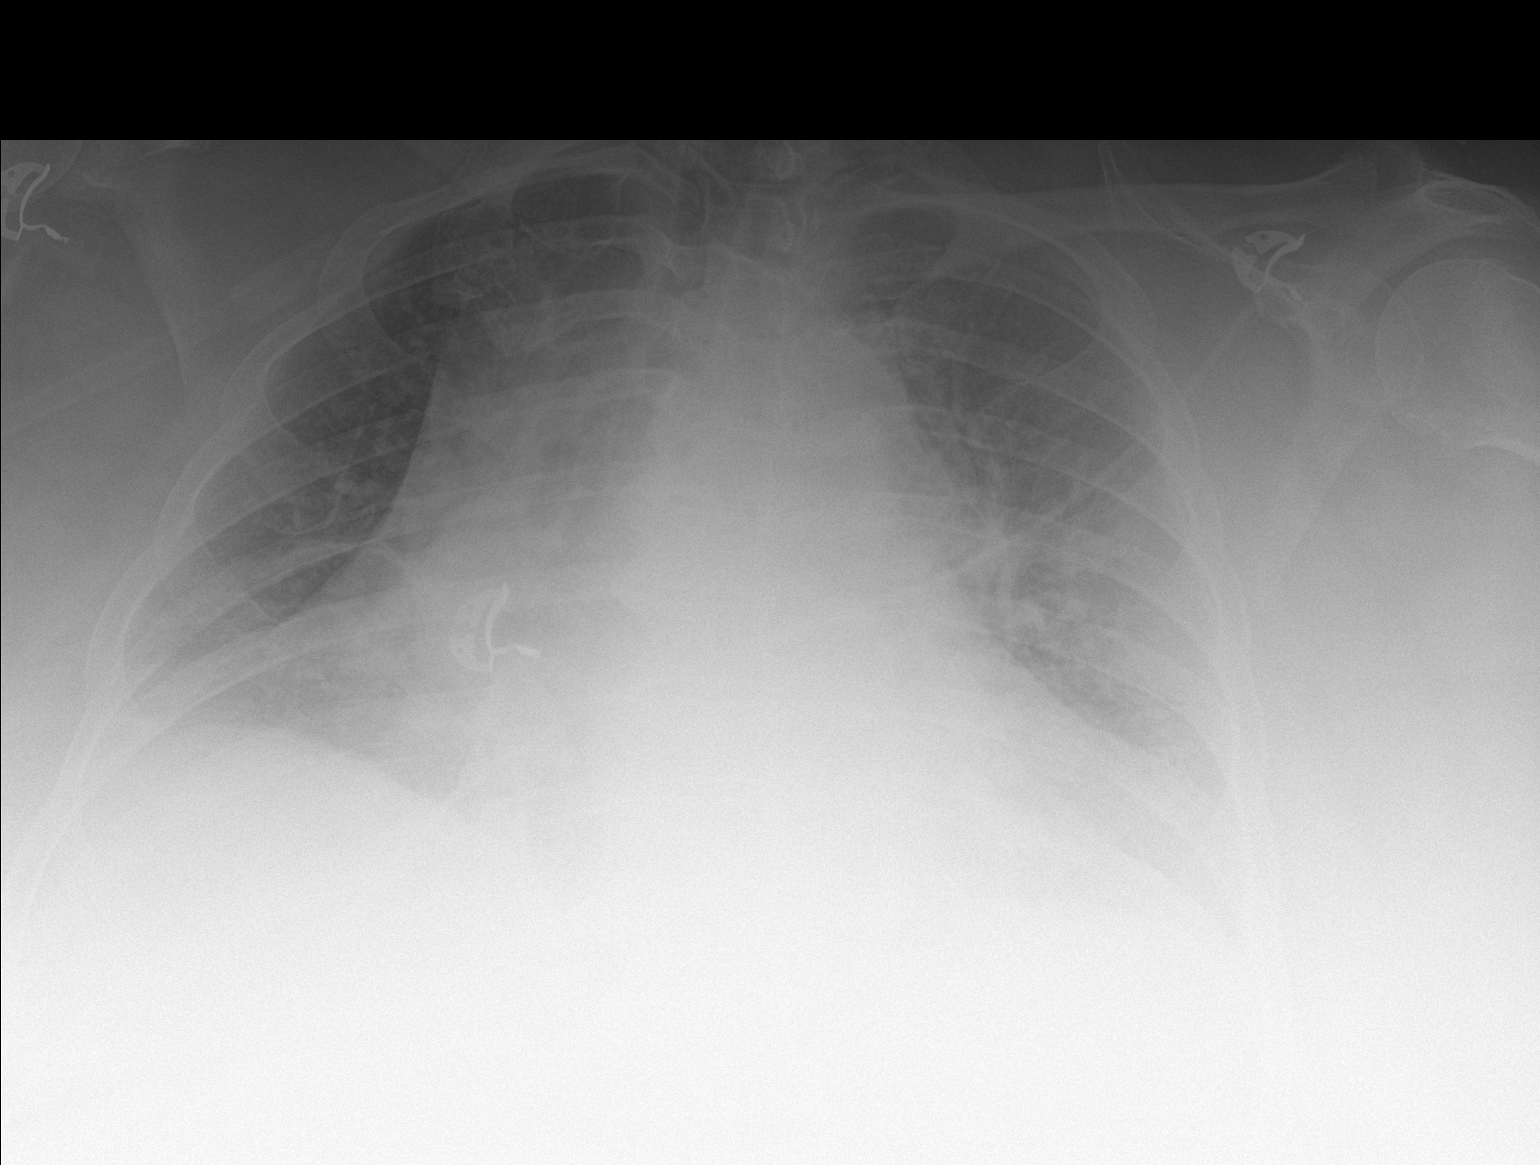

[1 of 1 positions shown; findings below may reference images not displayed]

FINDINGS: There is cardiomegaly with pulmonary venous hypertension. No edema
or airspace opacity. No adenopathy. No bone lesions.
IMPRESSION: Cardiomegaly with pulmonary vascular congestion. No edema or
airspace opacity.

## 2022-03-02 IMAGING — DX DG CHEST 1V
1 series · 1 of 1 positions shown · non-contrast
Comparison: August 12, 2019

CLINICAL DATA: Hypoxemia.

EXAM:
CHEST  1 VIEW

[chest]
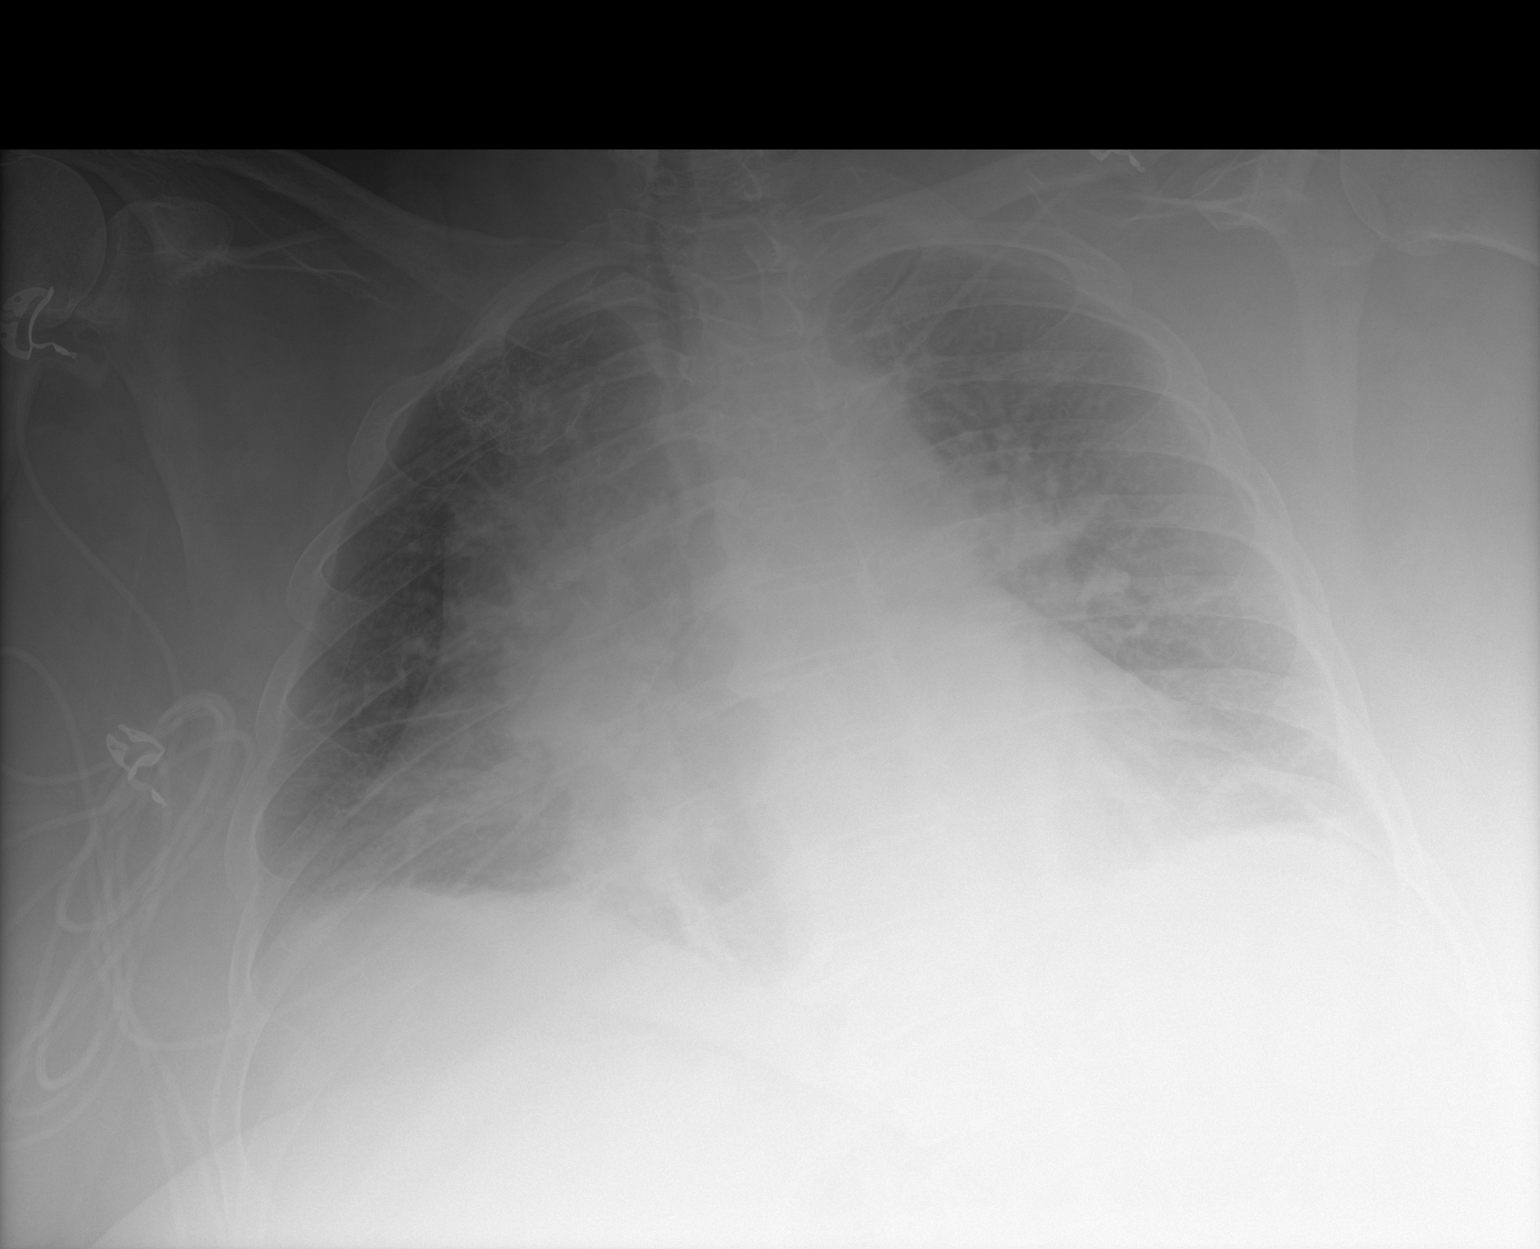

[1 of 1 positions shown; findings below may reference images not displayed]

FINDINGS: Stable cardiomegaly. Pulmonary vascular congestion. No focal
infiltrate. No pneumothorax. No interval changes.
IMPRESSION: Cardiomegaly and pulmonary vascular congestion. No acute
abnormalities otherwise seen.

## 2022-05-28 IMAGING — CR DG CHEST 2V
2 series · 2 of 2 positions shown · non-contrast
Comparison: August 14, 2019

CLINICAL DATA: Shortness of breath and chest pain.

EXAM:
CHEST - 2 VIEW

[chest lat]
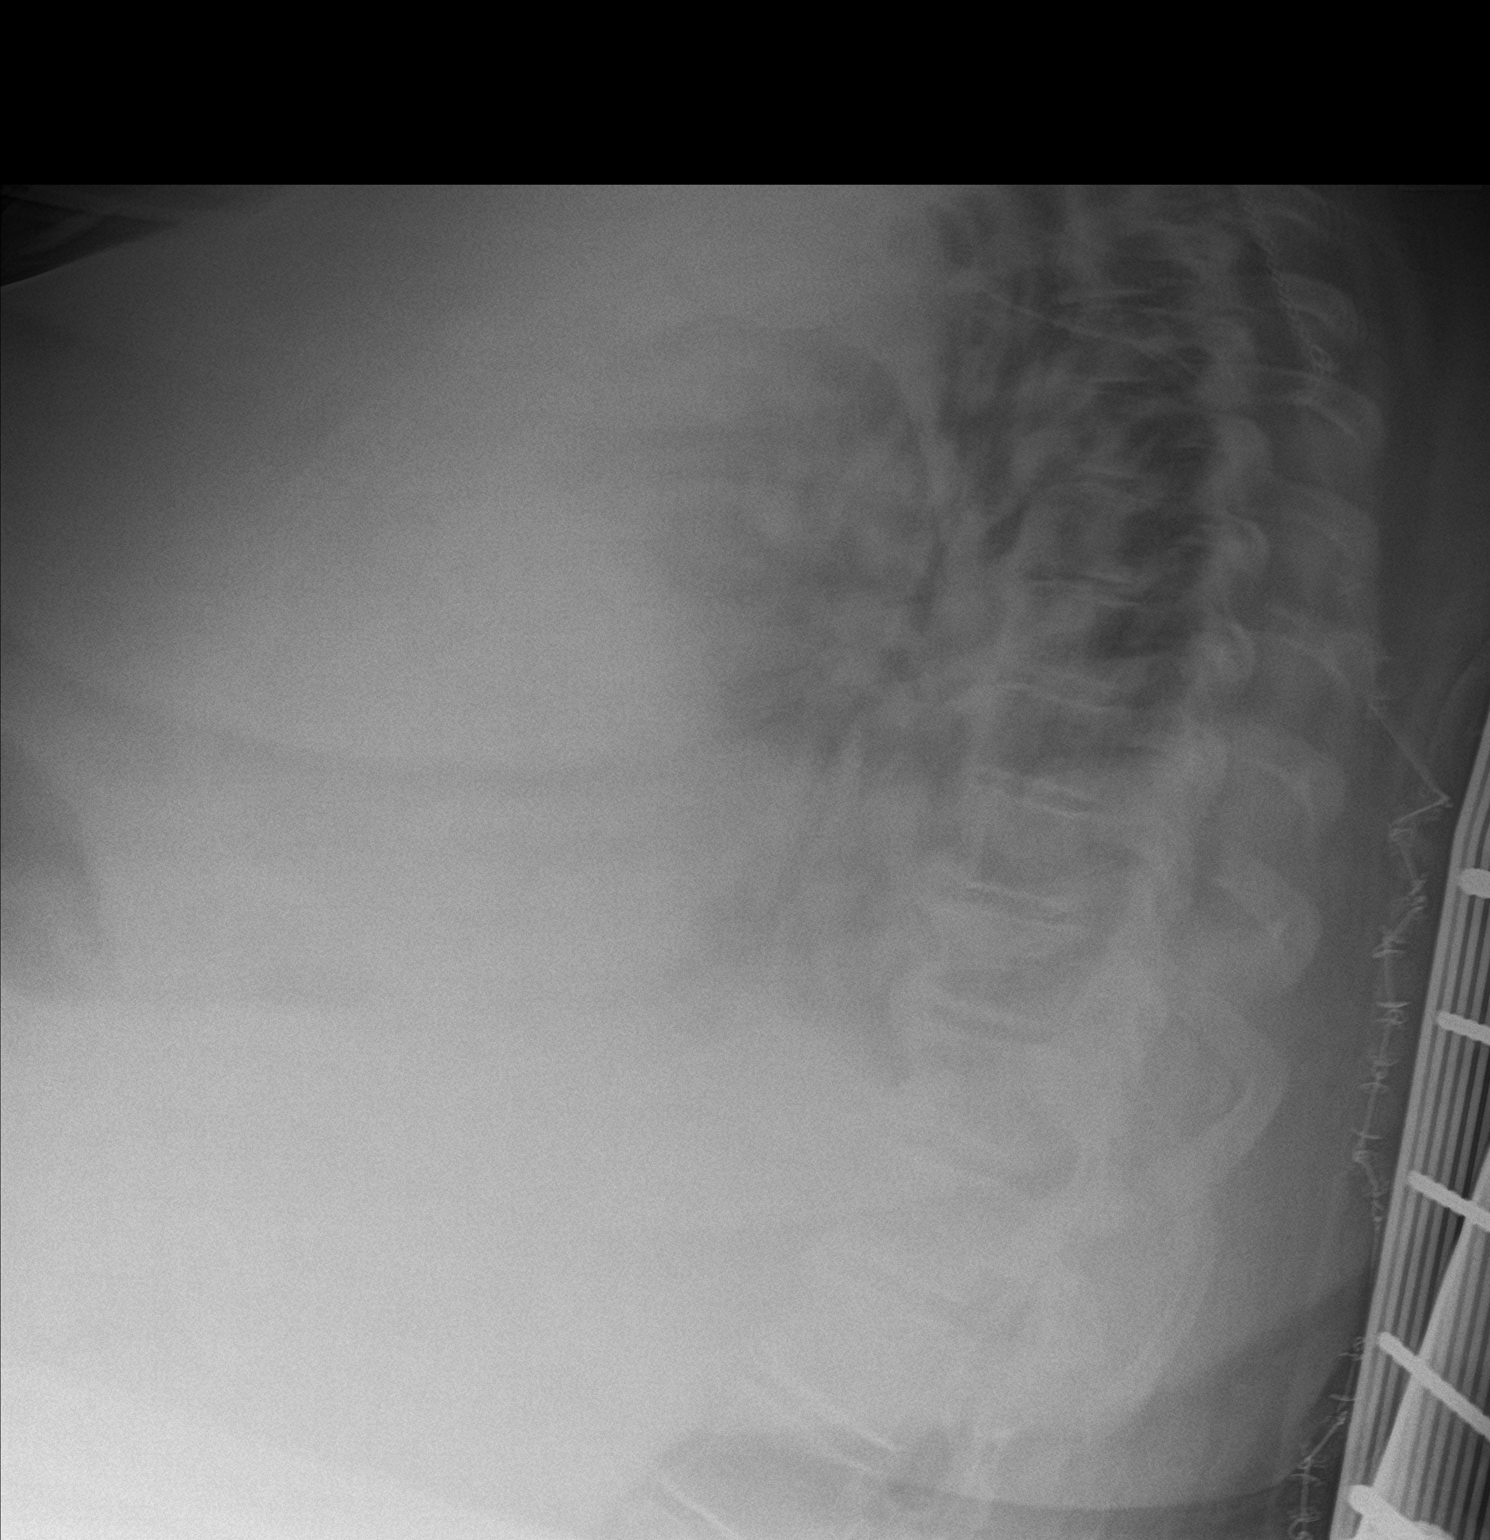

[chest ap]
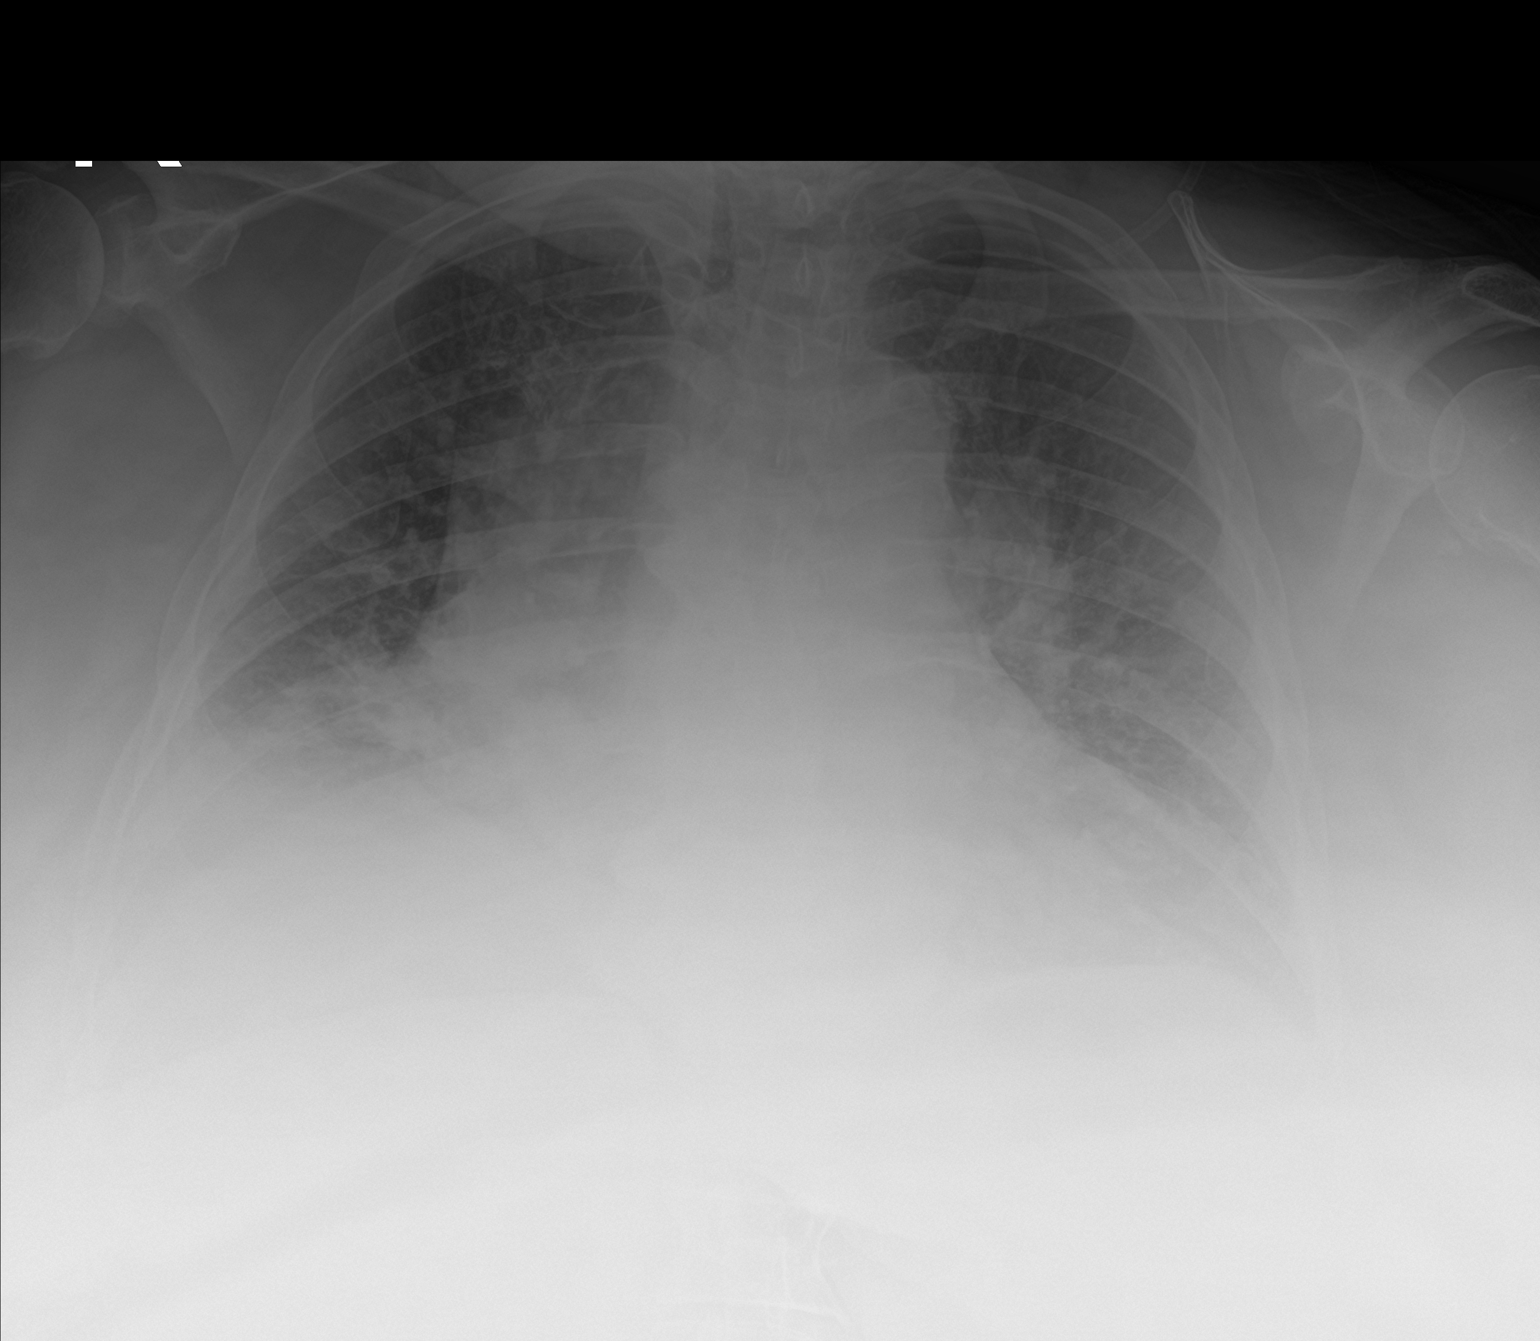

[2 of 2 positions shown; findings below may reference images not displayed]

FINDINGS: Mildly increased perihilar pulmonary vasculature is seen. A mild to
moderate severity right basilar atelectasis and/or infiltrate is
noted. There is no evidence of a pleural effusion or pneumothorax.
The cardiac silhouette is markedly enlarged. There is persistent
widening of the superior mediastinum. The visualized skeletal
structures are unremarkable.
IMPRESSION: 1. Cardiomegaly with mild perihilar pulmonary vascular congestion.
2. Mild to moderate severity right basilar atelectasis and/or
infiltrate.

## 2022-05-29 IMAGING — DX DG CHEST 1V PORT
1 series · 1 of 1 positions shown · non-contrast
Comparison: 11/10/2019.

CLINICAL DATA: Tube placement.

EXAM:
PORTABLE CHEST 1 VIEW

[chest ap]
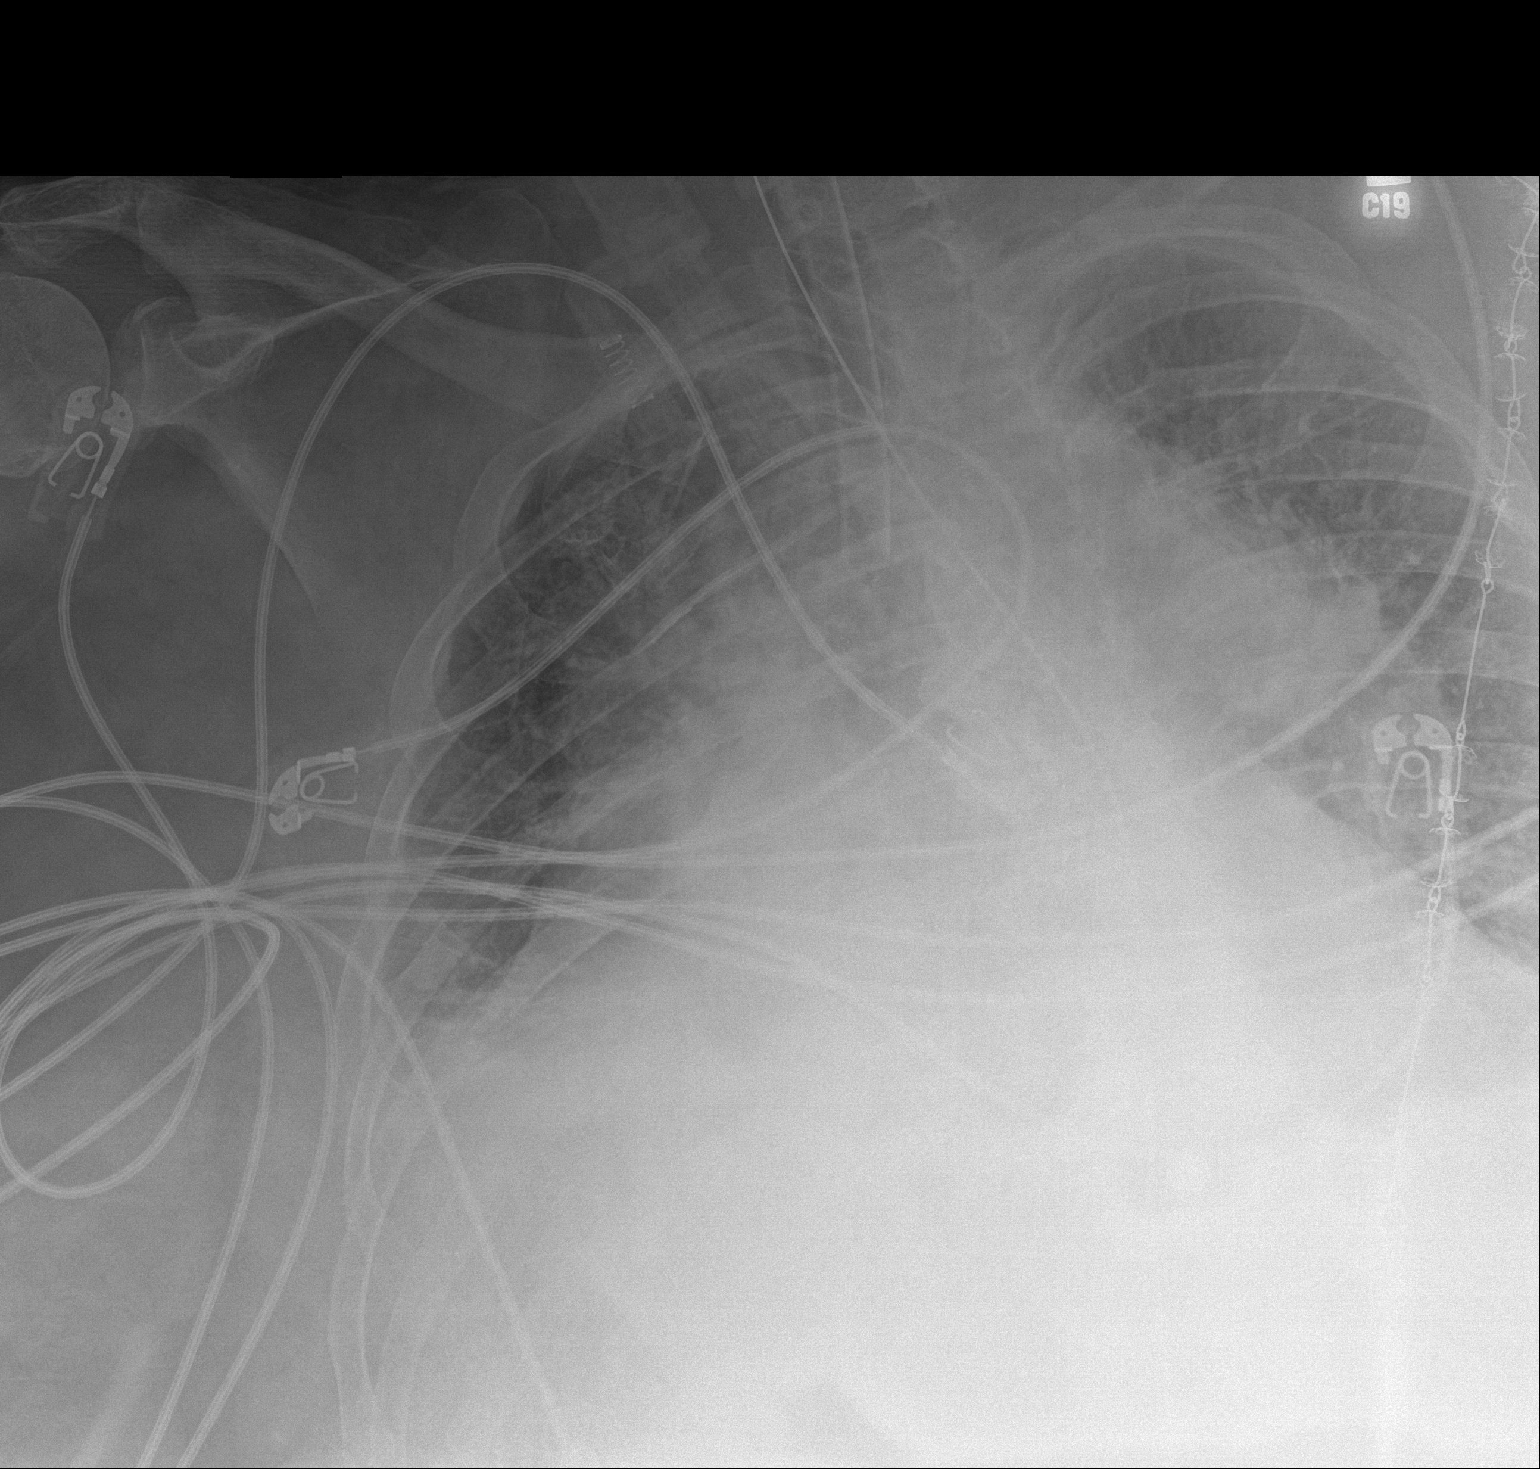

[1 of 1 positions shown; findings below may reference images not displayed]

FINDINGS: Endotracheal tube noted with tip 4 cm above the carina. NG tube
noted with tip below left hemidiaphragm. Severe cardiomegaly again
noted without interim change. Bilateral interstitial prominence
suggesting interstitial edema again noted. No interim change. Small
right pleural effusion cannot be excluded. Left costophrenic angle
not imaged. No pneumothorax.
IMPRESSION: 1. Endotracheal tube noted with tip 4 cm above the carina. NG tube
noted tip below left hemidiaphragm.

2. Severe cardiomegaly again noted without interim change. Bilateral
interstitial prominence suggesting interstitial edema again noted.
Similar findings noted on prior exam. Small right pleural effusion
cannot be excluded. Left costophrenic angle not imaged.

## 2022-05-29 IMAGING — DX DG CHEST 1V PORT
1 series · 1 of 1 positions shown · non-contrast
Comparison: Radiograph yesterday.  CT 06/14/2019

CLINICAL DATA: Shortness of breath.

EXAM:
PORTABLE CHEST 1 VIEW

[chest]
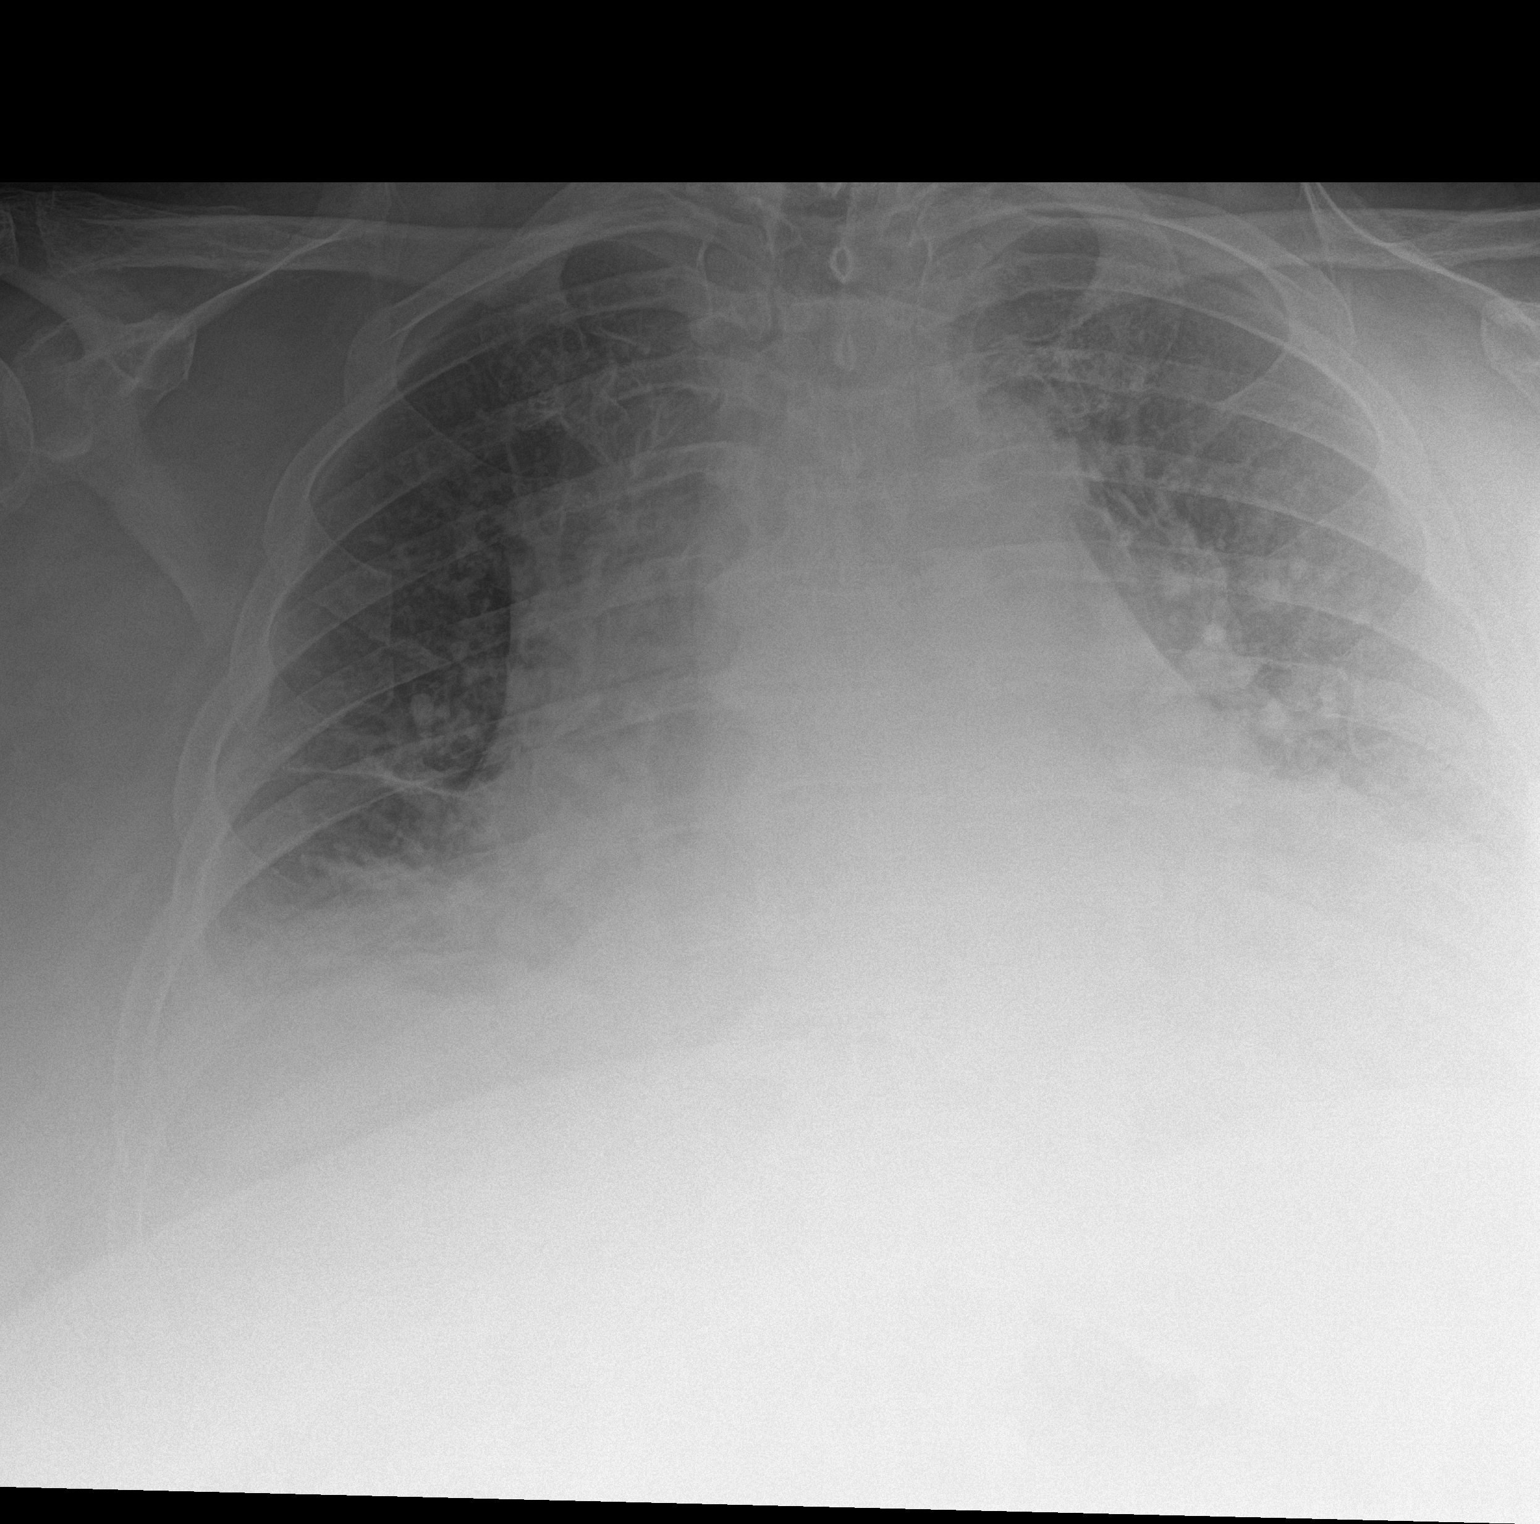

[1 of 1 positions shown; findings below may reference images not displayed]

FINDINGS: Stable cardiomegaly. Vascular congestion. Stable streaky opacity at
the right lung base. No pneumothorax. Left costophrenic angle is not
entirely included in the field of view.
IMPRESSION: 1. Stable cardiomegaly with vascular congestion.
2. Unchanged streaky opacity at the right lung base, may be
atelectasis or pneumonia.
3. Technically limited exam due to habitus and portable technique.

## 2022-05-30 IMAGING — DX DG CHEST 1V PORT
1 series · 1 of 1 positions shown · non-contrast
Comparison: Radiograph 11/10/2019

CLINICAL DATA: Hypoxia

EXAM:
PORTABLE CHEST 1 VIEW

[chest ap]
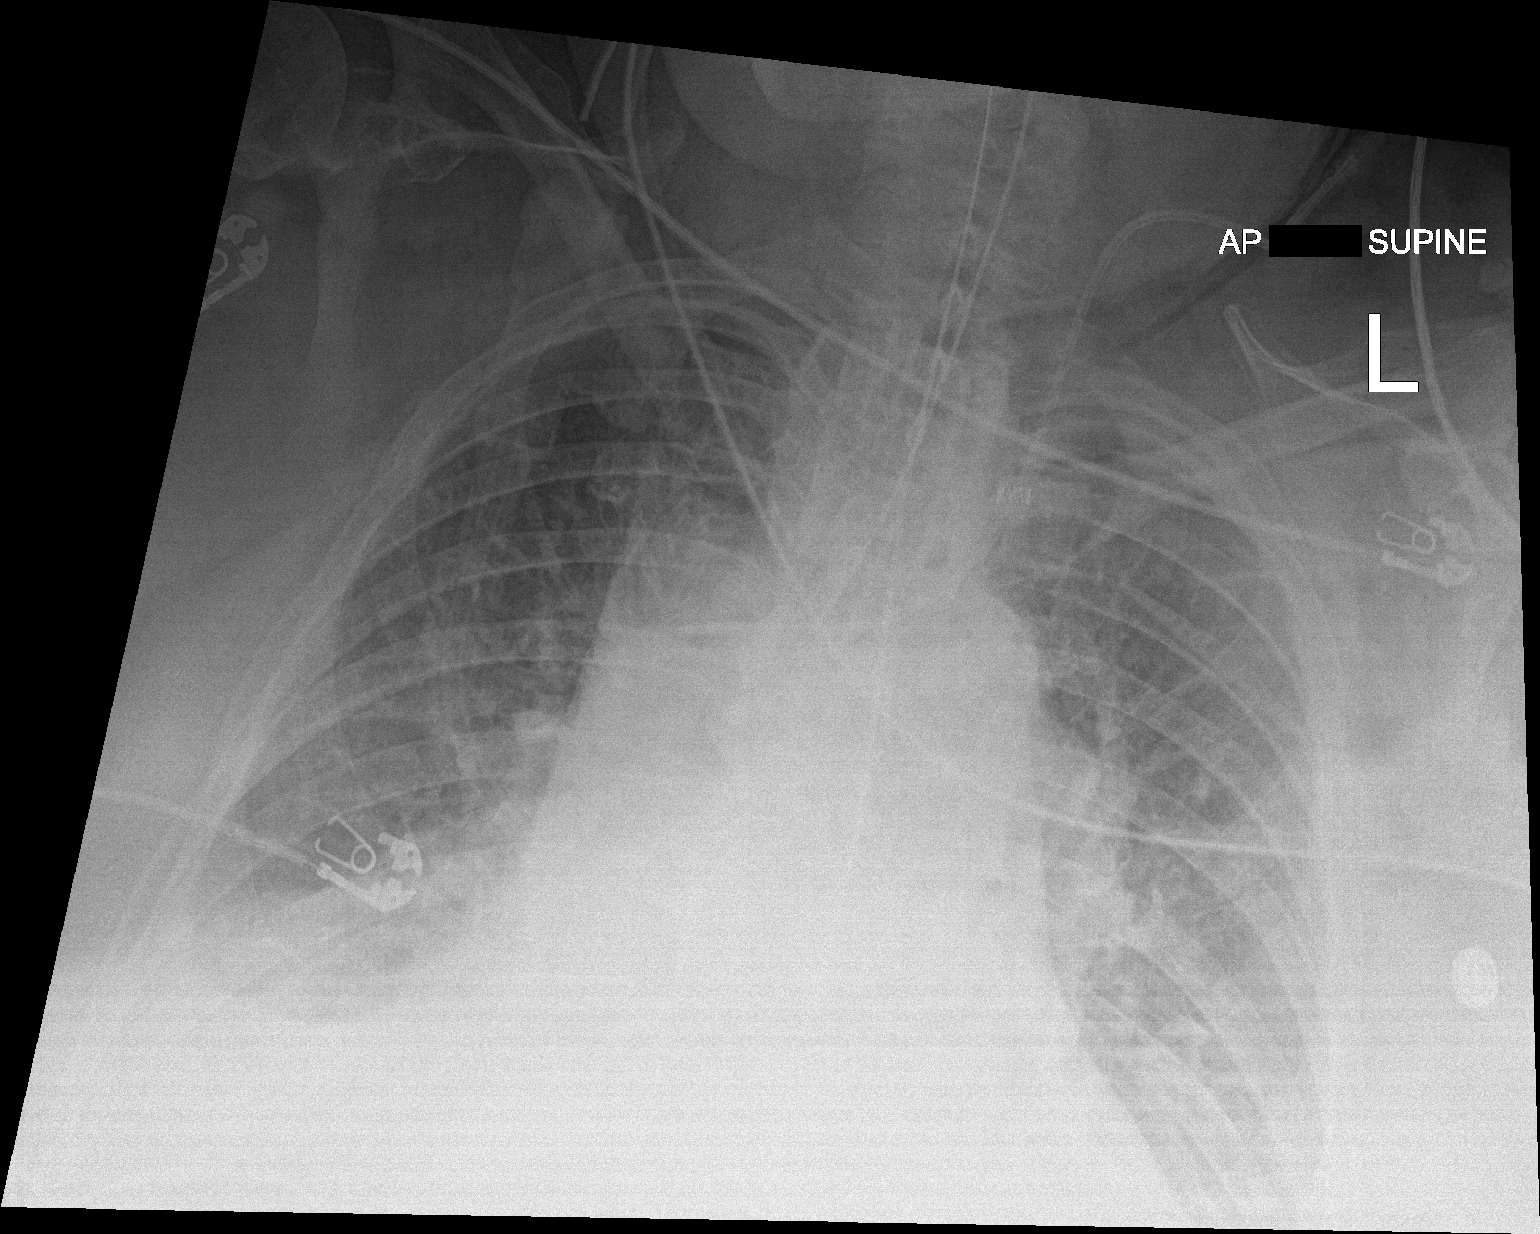

[1 of 1 positions shown; findings below may reference images not displayed]

FINDINGS: Low positioning of the endotracheal tube closely approximating the
level of the carina. Recommend retraction approximately 3 cm to the
mid trachea.

Transesophageal tube tip terminates below the level of imaging,
beyond the GE junction.

Left IJ approach central venous catheter tip terminates at the
confluence of the SVC and brachiocephalic vein.

Telemetry leads and external support devices overlie the chest.

Stable cardiomegaly without significant interval change. Bilateral
interstitial opacities with more hazy and confluent opacity in the
medial lung bases. Suspect a right pleural effusion. No visible left
effusion though the costophrenic sulcus is collimated. No
discernible pneumothorax. No acute osseous or soft tissue
abnormality.
IMPRESSION: 1. Low positioning of the endotracheal tube closely approximating
the level of the carina. Recommend retraction approximately 3 cm to
the mid trachea.
2. Additional lines and tubes as above.
3. Stable cardiomegaly with bilateral interstitial opacities and
more hazy and confluent opacity in the medial lung bases. Could
reflect a combination of pulmonary edema, atelectasis and underlying
airspace disease.
4. Small right effusion.

These results will be called to the ordering clinician or
representative by the Radiologist Assistant, and communication
documented in the PACS or [REDACTED].

## 2022-05-30 IMAGING — CT CT HEAD W/O CM
3 of 4 series · 13 of 47 positions shown, 15 images · non-contrast
Comparison: 01/22/2017

CLINICAL DATA: Intracranial hemorrhage suspected.

EXAM:
CT HEAD WITHOUT CONTRAST
TECHNIQUE: Contiguous axial images were obtained from the base of the skull
through the vertex without intravenous contrast.

[Series 3: head without ax · axial · non-contrast · 0.44mm/px · z∈[-148,-13]mm · 7 of 40 slices shown, 9 images]
[im 5/40  brain]
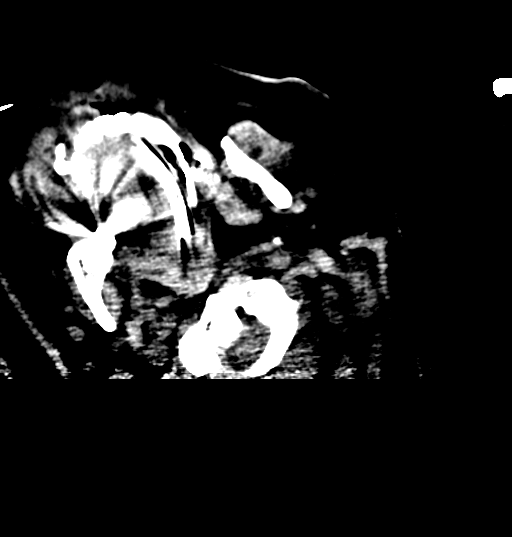
[im 5/40  bone]
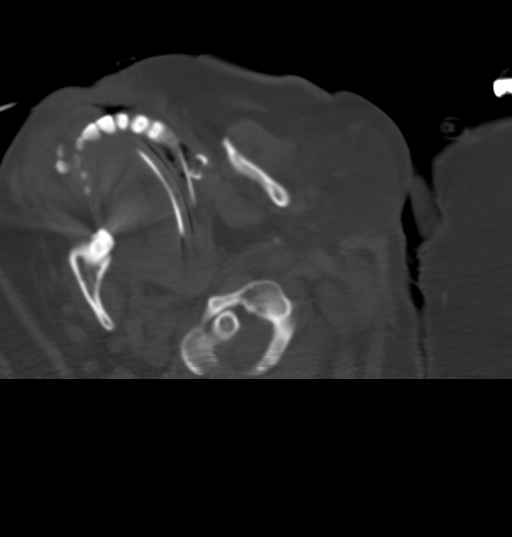
[im 10/40  brain]
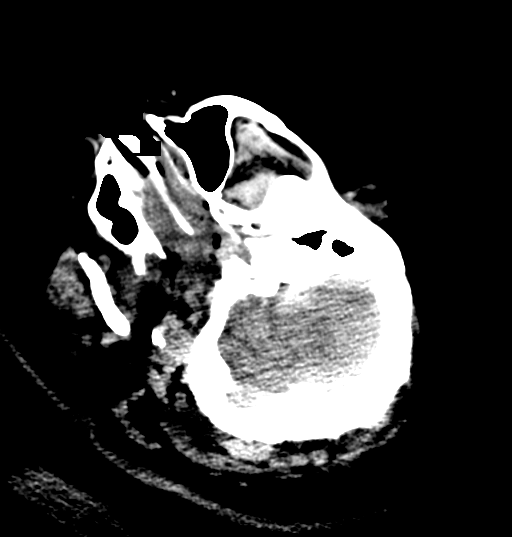
[im 15/40  brain]
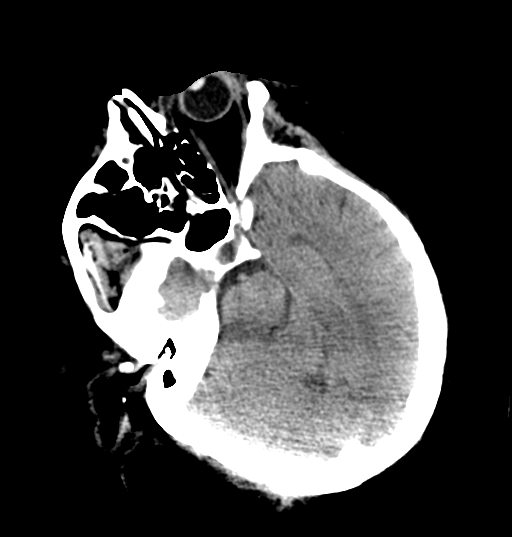
[im 20/40  brain]
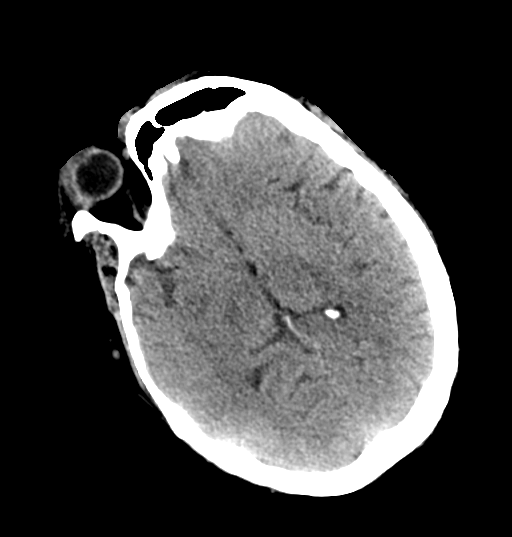
[im 25/40  brain]
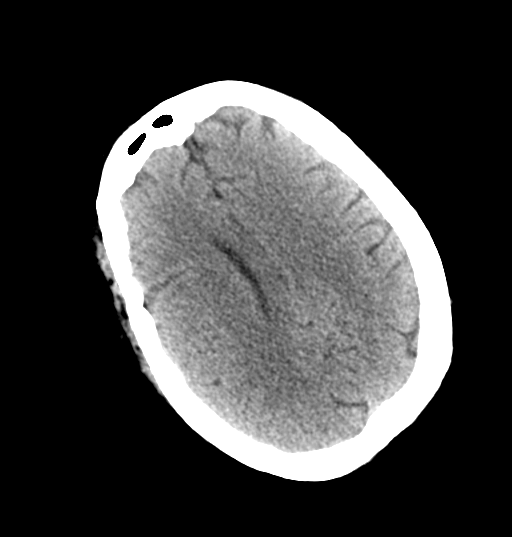
[im 25/40  bone]
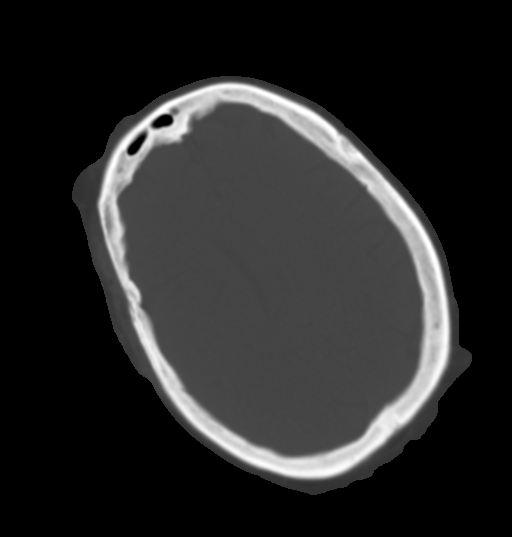
[im 30/40  brain]
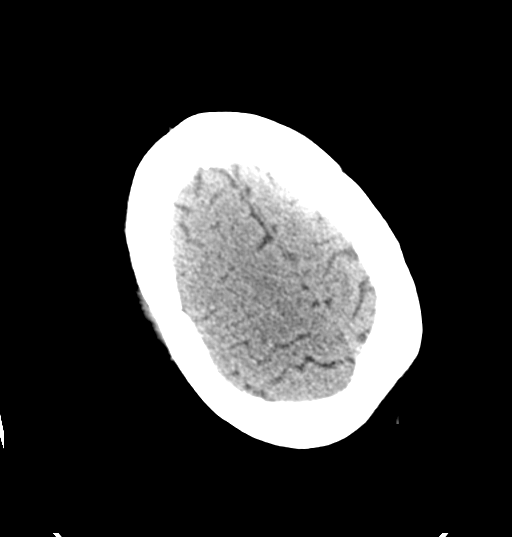
[im 35/40  brain]
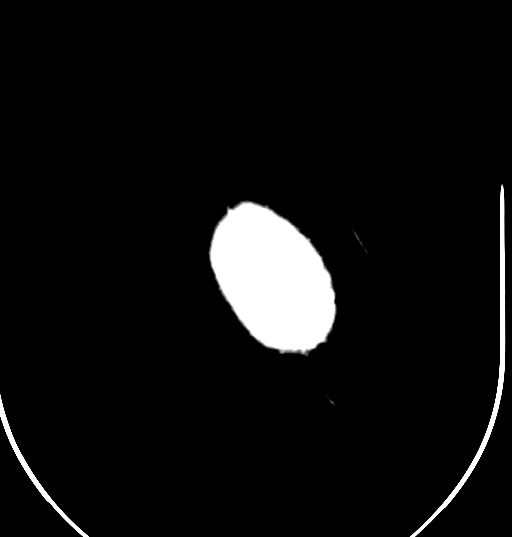

[Series 5: head without cor · coronal · non-contrast · 0.34mm/px · 3 of 92 slices shown]
[im 31/92  brain]
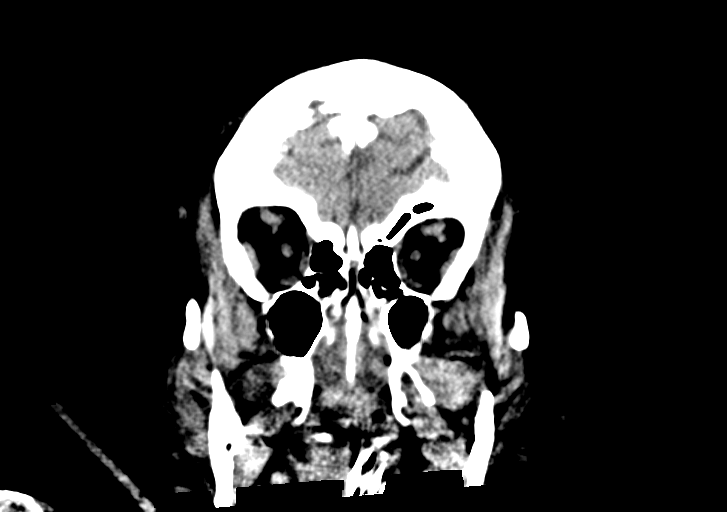
[im 41/92  brain]
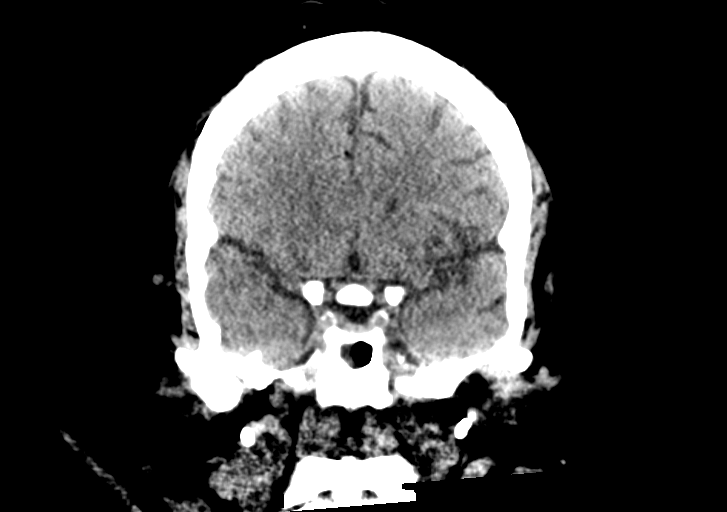
[im 51/92  brain]
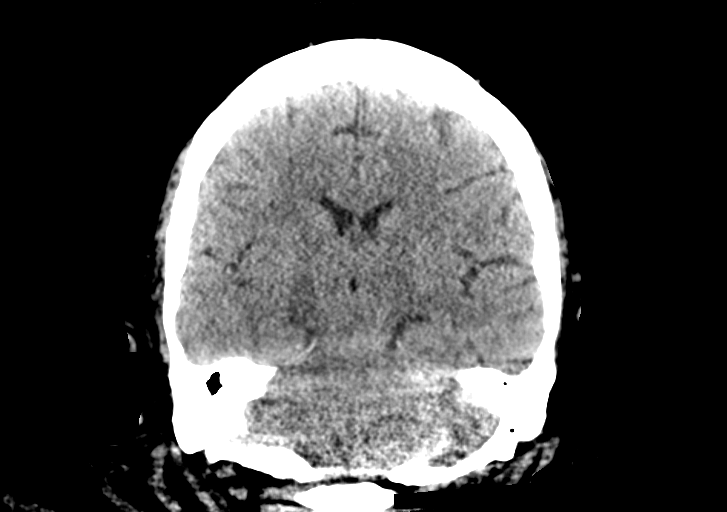

[Series 6: head without sag · sagittal · non-contrast · 0.34mm/px · 3 of 67 slices shown]
[im 23/67  brain]
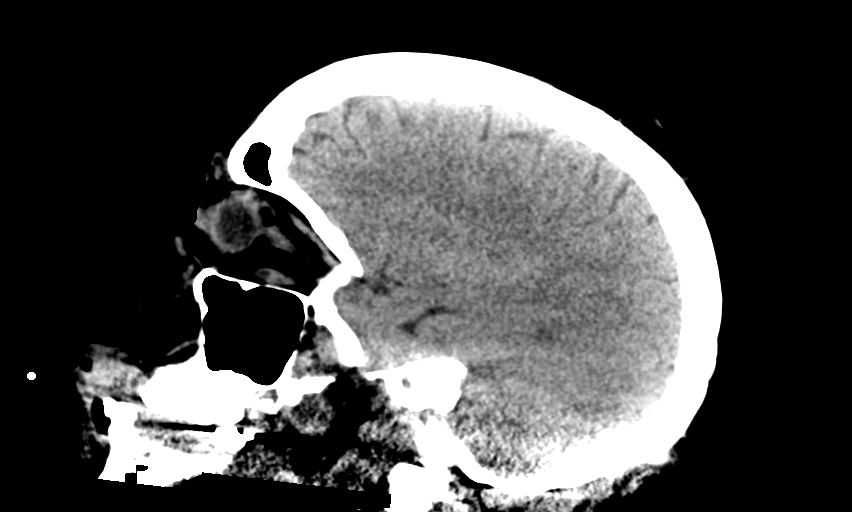
[im 34/67  brain]
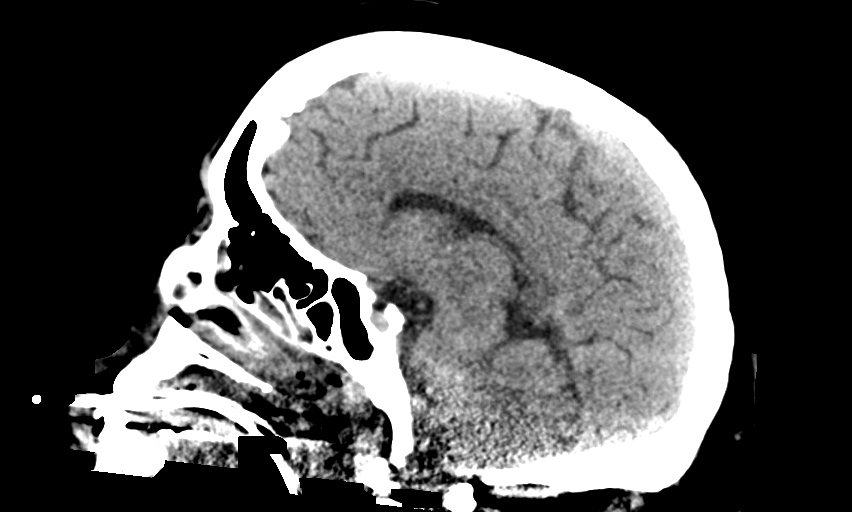
[im 45/67  brain]
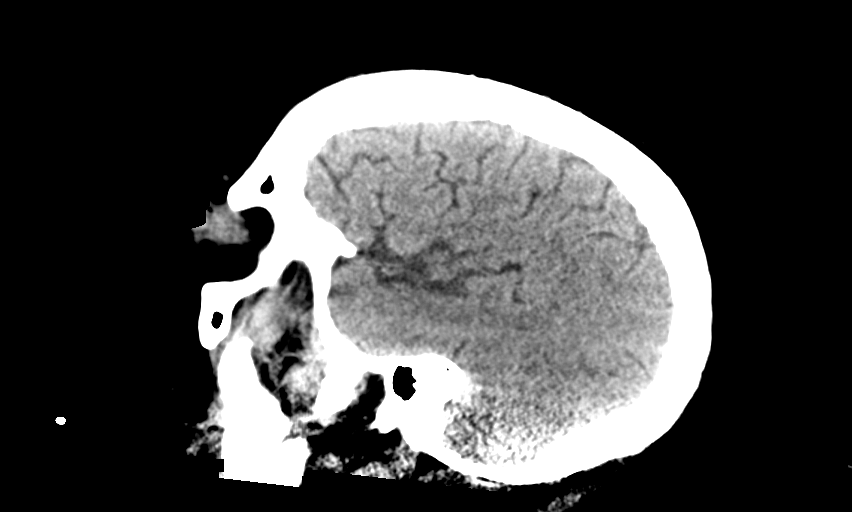

[13 of 47 positions shown; findings below may reference images not displayed]

FINDINGS: Brain: There is no mass, hemorrhage or extra-axial collection. The
size and configuration of the ventricles and extra-axial CSF spaces
are normal. The brain parenchyma is normal, without acute or chronic
infarction.

Vascular: No abnormal hyperdensity of the major intracranial
arteries or dural venous sinuses. No intracranial atherosclerosis.

Skull: The visualized skull base, calvarium and extracranial soft
tissues are normal.

Sinuses/Orbits: No fluid levels or advanced mucosal thickening of
the visualized paranasal sinuses. No mastoid or middle ear effusion.
The orbits are normal.
IMPRESSION: Normal head CT.

## 2022-05-31 IMAGING — DX DG CHEST 1V PORT
1 series · 1 of 1 positions shown · non-contrast
Comparison: November 11, 2019

CLINICAL DATA: Hypoxia

EXAM:
PORTABLE CHEST 1 VIEW

[chest]
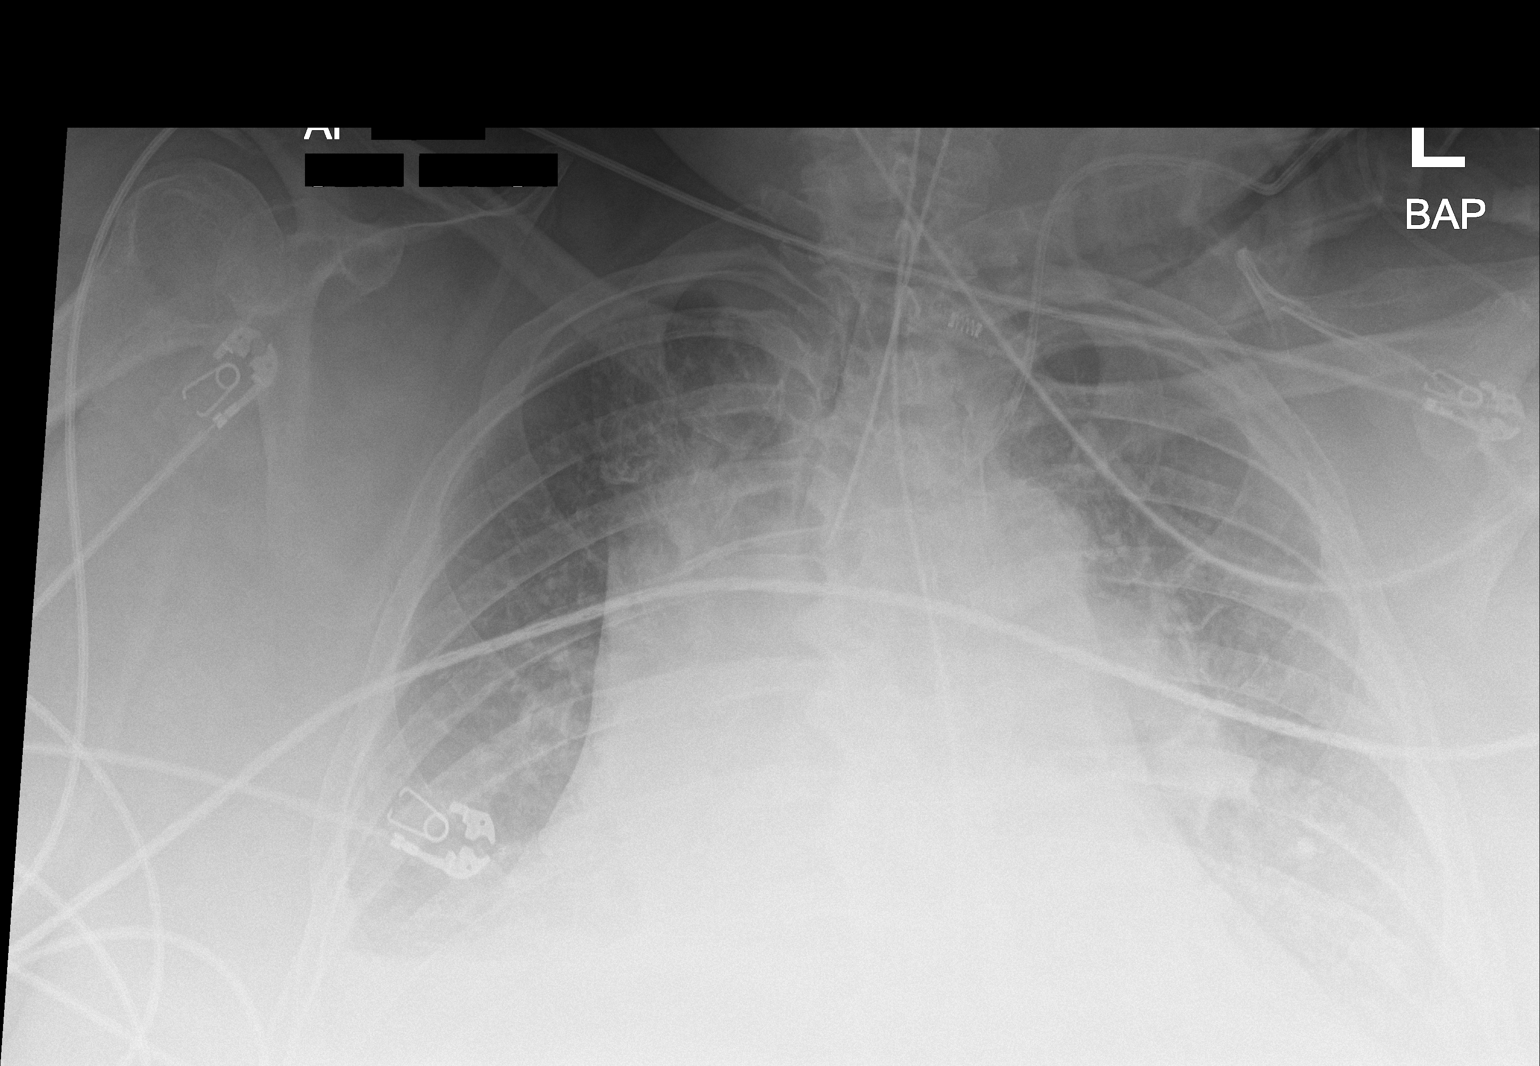

[1 of 1 positions shown; findings below may reference images not displayed]

FINDINGS: The ETT terminates 2.6 cm above the carina in good position. The
left central line terminates in the SVC. The NG tube is not well
seen distally due to poor penetration. No pneumothorax. Bilateral
layering effusions with underlying atelectasis are stable. Mild
pulmonary venous congestion/mild edema not excluded. Stable
cardiomegaly. The hila and mediastinum are unchanged.
IMPRESSION: 1. Support apparatus as above.
2. Small bilateral pleural effusions with underlying atelectasis.
Suggested mild pulmonary venous congestion/mild edema.

## 2022-06-02 IMAGING — DX DG CHEST 1V PORT
1 series · 1 of 1 positions shown · non-contrast
Comparison: Radiograph 11/12/2019.  CT 06/14/2019

CLINICAL DATA: Hypoxia.

EXAM:
PORTABLE CHEST 1 VIEW

[chest]
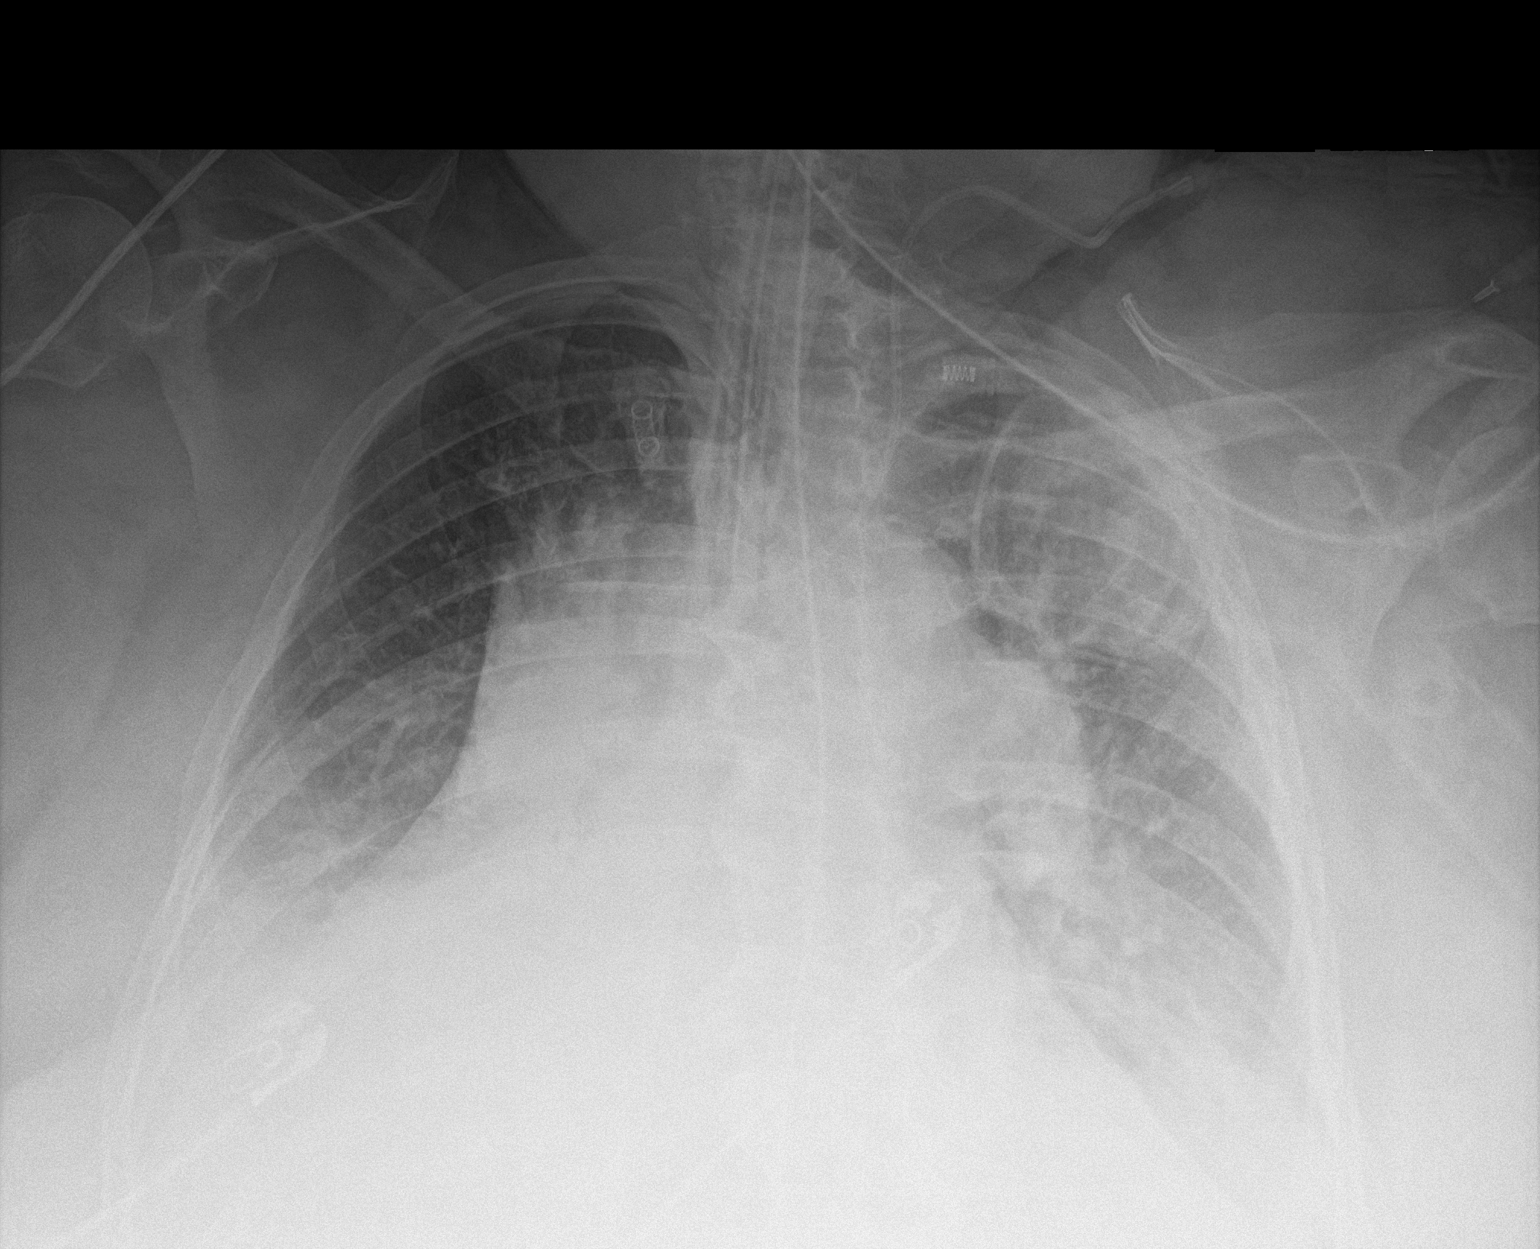

[1 of 1 positions shown; findings below may reference images not displayed]

FINDINGS: Current is difficult to define, however the endotracheal tube tip
appears to be 19 mm from the carina. Enteric tube in place with tip
below the diaphragm not included in the field of view. Left internal
jugular central venous catheter tip in the region of the upper SVC.
Stable cardiomegaly. Hazy bilateral lung base opacities likely
representing pleural effusions. Vascular congestion. Slight
improvement in pulmonary edema. No pneumothorax. Overlying artifact
projects over the upper left chest.
IMPRESSION: 1. Stable cardiomegaly. Vascular congestion. Improving pulmonary
edema.
2. Bilateral pleural effusions.
3. Stable support apparatus.

## 2023-10-28 NOTE — Telephone Encounter (Signed)
 error
